# Patient Record
Sex: Male | Born: 1935
Health system: Southern US, Community
[De-identification: ages and names within clinical notes are randomized; demographics above are authoritative.]

## PROBLEM LIST (undated history)

## (undated) DIAGNOSIS — M1990 Unspecified osteoarthritis, unspecified site: Secondary | ICD-10-CM

## (undated) DIAGNOSIS — R001 Bradycardia, unspecified: Secondary | ICD-10-CM

## (undated) DIAGNOSIS — R609 Edema, unspecified: Secondary | ICD-10-CM

## (undated) DIAGNOSIS — I10 Essential (primary) hypertension: Secondary | ICD-10-CM

## (undated) DIAGNOSIS — S42309A Unspecified fracture of shaft of humerus, unspecified arm, initial encounter for closed fracture: Secondary | ICD-10-CM

## (undated) DIAGNOSIS — R600 Localized edema: Secondary | ICD-10-CM

## (undated) DIAGNOSIS — K219 Gastro-esophageal reflux disease without esophagitis: Secondary | ICD-10-CM

## (undated) DIAGNOSIS — I219 Acute myocardial infarction, unspecified: Secondary | ICD-10-CM

## (undated) DIAGNOSIS — E119 Type 2 diabetes mellitus without complications: Secondary | ICD-10-CM

## (undated) DIAGNOSIS — I2510 Atherosclerotic heart disease of native coronary artery without angina pectoris: Secondary | ICD-10-CM

## (undated) DIAGNOSIS — E785 Hyperlipidemia, unspecified: Secondary | ICD-10-CM

## (undated) DIAGNOSIS — N186 End stage renal disease: Secondary | ICD-10-CM

## (undated) DIAGNOSIS — R0609 Other forms of dyspnea: Secondary | ICD-10-CM

## (undated) DIAGNOSIS — R011 Cardiac murmur, unspecified: Secondary | ICD-10-CM

## (undated) DIAGNOSIS — I509 Heart failure, unspecified: Secondary | ICD-10-CM

## (undated) DIAGNOSIS — R0600 Dyspnea, unspecified: Secondary | ICD-10-CM

## (undated) DIAGNOSIS — J189 Pneumonia, unspecified organism: Secondary | ICD-10-CM

## (undated) DIAGNOSIS — Z7902 Long term (current) use of antithrombotics/antiplatelets: Secondary | ICD-10-CM

## (undated) DIAGNOSIS — I429 Cardiomyopathy, unspecified: Secondary | ICD-10-CM

## (undated) DIAGNOSIS — D649 Anemia, unspecified: Secondary | ICD-10-CM

## (undated) DIAGNOSIS — N189 Chronic kidney disease, unspecified: Secondary | ICD-10-CM

## (undated) DIAGNOSIS — I4891 Unspecified atrial fibrillation: Secondary | ICD-10-CM

## (undated) DIAGNOSIS — I209 Angina pectoris, unspecified: Secondary | ICD-10-CM

## (undated) DIAGNOSIS — D509 Iron deficiency anemia, unspecified: Secondary | ICD-10-CM

## (undated) DIAGNOSIS — I5020 Unspecified systolic (congestive) heart failure: Secondary | ICD-10-CM

## (undated) HISTORY — PX: CHOLECYSTECTOMY: SHX55

## (undated) HISTORY — PX: CORONARY ANGIOPLASTY: SHX604

## (undated) HISTORY — PX: VEIN BYPASS SURGERY: SHX833

## (undated) HISTORY — PX: FRACTURE SURGERY: SHX138

## (undated) HISTORY — PX: CORONARY ARTERY BYPASS GRAFT: SHX141

---

## 1998-05-24 DIAGNOSIS — I219 Acute myocardial infarction, unspecified: Secondary | ICD-10-CM

## 1998-05-24 HISTORY — DX: Acute myocardial infarction, unspecified: I21.9

## 1999-05-25 DIAGNOSIS — Z951 Presence of aortocoronary bypass graft: Secondary | ICD-10-CM

## 1999-05-25 HISTORY — PX: CORONARY ARTERY BYPASS GRAFT: SHX141

## 1999-05-25 HISTORY — DX: Presence of aortocoronary bypass graft: Z95.1

## 2000-09-28 ENCOUNTER — Inpatient Hospital Stay (HOSPITAL_COMMUNITY)
Admission: AD | Admit: 2000-09-28 | Discharge: 2000-10-06 | Payer: Self-pay | Admitting: Thoracic Surgery (Cardiothoracic Vascular Surgery)

## 2000-09-28 ENCOUNTER — Encounter: Payer: Self-pay | Admitting: Thoracic Surgery (Cardiothoracic Vascular Surgery)

## 2000-09-29 ENCOUNTER — Encounter: Payer: Self-pay | Admitting: Thoracic Surgery (Cardiothoracic Vascular Surgery)

## 2000-09-30 ENCOUNTER — Encounter: Payer: Self-pay | Admitting: Thoracic Surgery (Cardiothoracic Vascular Surgery)

## 2000-10-01 ENCOUNTER — Encounter: Payer: Self-pay | Admitting: Thoracic Surgery (Cardiothoracic Vascular Surgery)

## 2000-10-04 ENCOUNTER — Encounter: Payer: Self-pay | Admitting: Thoracic Surgery (Cardiothoracic Vascular Surgery)

## 2004-04-02 ENCOUNTER — Inpatient Hospital Stay: Payer: Self-pay | Admitting: Cardiovascular Disease

## 2004-04-02 ENCOUNTER — Other Ambulatory Visit: Payer: Self-pay

## 2004-04-03 ENCOUNTER — Other Ambulatory Visit: Payer: Self-pay

## 2004-04-08 ENCOUNTER — Ambulatory Visit: Payer: Self-pay | Admitting: Surgery

## 2005-10-11 ENCOUNTER — Ambulatory Visit: Payer: Self-pay | Admitting: Gastroenterology

## 2006-10-06 DIAGNOSIS — I10 Essential (primary) hypertension: Secondary | ICD-10-CM | POA: Insufficient documentation

## 2006-10-06 DIAGNOSIS — E663 Overweight: Secondary | ICD-10-CM | POA: Insufficient documentation

## 2007-01-06 DIAGNOSIS — K2100 Gastro-esophageal reflux disease with esophagitis, without bleeding: Secondary | ICD-10-CM | POA: Insufficient documentation

## 2007-01-06 DIAGNOSIS — J301 Allergic rhinitis due to pollen: Secondary | ICD-10-CM | POA: Insufficient documentation

## 2009-02-06 ENCOUNTER — Ambulatory Visit: Payer: Self-pay | Admitting: Cardiovascular Disease

## 2009-02-06 HISTORY — PX: OTHER SURGICAL HISTORY: SHX169

## 2012-05-02 DIAGNOSIS — K922 Gastrointestinal hemorrhage, unspecified: Secondary | ICD-10-CM | POA: Insufficient documentation

## 2013-05-04 DIAGNOSIS — N289 Disorder of kidney and ureter, unspecified: Secondary | ICD-10-CM | POA: Insufficient documentation

## 2013-05-24 DIAGNOSIS — S42309A Unspecified fracture of shaft of humerus, unspecified arm, initial encounter for closed fracture: Secondary | ICD-10-CM

## 2013-05-24 HISTORY — PX: FRACTURE SURGERY: SHX138

## 2013-05-24 HISTORY — DX: Unspecified fracture of shaft of humerus, unspecified arm, initial encounter for closed fracture: S42.309A

## 2013-06-01 DIAGNOSIS — I2510 Atherosclerotic heart disease of native coronary artery without angina pectoris: Secondary | ICD-10-CM | POA: Diagnosis not present

## 2013-06-01 DIAGNOSIS — E119 Type 2 diabetes mellitus without complications: Secondary | ICD-10-CM | POA: Diagnosis not present

## 2013-06-01 DIAGNOSIS — I10 Essential (primary) hypertension: Secondary | ICD-10-CM | POA: Diagnosis not present

## 2013-06-01 DIAGNOSIS — S83419A Sprain of medial collateral ligament of unspecified knee, initial encounter: Secondary | ICD-10-CM | POA: Diagnosis not present

## 2013-06-01 DIAGNOSIS — N400 Enlarged prostate without lower urinary tract symptoms: Secondary | ICD-10-CM | POA: Diagnosis not present

## 2013-06-01 DIAGNOSIS — E785 Hyperlipidemia, unspecified: Secondary | ICD-10-CM | POA: Diagnosis not present

## 2013-06-04 DIAGNOSIS — N183 Chronic kidney disease, stage 3 unspecified: Secondary | ICD-10-CM | POA: Diagnosis not present

## 2013-06-04 DIAGNOSIS — E7800 Pure hypercholesterolemia, unspecified: Secondary | ICD-10-CM | POA: Diagnosis not present

## 2013-06-04 DIAGNOSIS — I10 Essential (primary) hypertension: Secondary | ICD-10-CM | POA: Diagnosis not present

## 2013-06-04 DIAGNOSIS — R809 Proteinuria, unspecified: Secondary | ICD-10-CM | POA: Diagnosis not present

## 2013-06-06 ENCOUNTER — Emergency Department: Payer: Self-pay | Admitting: Emergency Medicine

## 2013-06-06 DIAGNOSIS — Z79899 Other long term (current) drug therapy: Secondary | ICD-10-CM | POA: Diagnosis not present

## 2013-06-06 DIAGNOSIS — E785 Hyperlipidemia, unspecified: Secondary | ICD-10-CM | POA: Diagnosis not present

## 2013-06-06 DIAGNOSIS — M79609 Pain in unspecified limb: Secondary | ICD-10-CM | POA: Diagnosis not present

## 2013-06-06 DIAGNOSIS — S46909A Unspecified injury of unspecified muscle, fascia and tendon at shoulder and upper arm level, unspecified arm, initial encounter: Secondary | ICD-10-CM | POA: Diagnosis not present

## 2013-06-06 DIAGNOSIS — S42213A Unspecified displaced fracture of surgical neck of unspecified humerus, initial encounter for closed fracture: Secondary | ICD-10-CM | POA: Diagnosis not present

## 2013-06-06 DIAGNOSIS — S42209A Unspecified fracture of upper end of unspecified humerus, initial encounter for closed fracture: Secondary | ICD-10-CM | POA: Diagnosis not present

## 2013-06-06 DIAGNOSIS — S6990XA Unspecified injury of unspecified wrist, hand and finger(s), initial encounter: Secondary | ICD-10-CM | POA: Diagnosis not present

## 2013-06-06 DIAGNOSIS — S4980XA Other specified injuries of shoulder and upper arm, unspecified arm, initial encounter: Secondary | ICD-10-CM | POA: Diagnosis not present

## 2013-06-06 DIAGNOSIS — S59909A Unspecified injury of unspecified elbow, initial encounter: Secondary | ICD-10-CM | POA: Diagnosis not present

## 2013-06-06 DIAGNOSIS — K219 Gastro-esophageal reflux disease without esophagitis: Secondary | ICD-10-CM | POA: Diagnosis not present

## 2013-06-07 DIAGNOSIS — S42213A Unspecified displaced fracture of surgical neck of unspecified humerus, initial encounter for closed fracture: Secondary | ICD-10-CM | POA: Diagnosis not present

## 2013-06-14 DIAGNOSIS — E559 Vitamin D deficiency, unspecified: Secondary | ICD-10-CM | POA: Diagnosis not present

## 2013-06-14 DIAGNOSIS — IMO0002 Reserved for concepts with insufficient information to code with codable children: Secondary | ICD-10-CM | POA: Diagnosis not present

## 2013-06-14 DIAGNOSIS — N183 Chronic kidney disease, stage 3 unspecified: Secondary | ICD-10-CM | POA: Diagnosis not present

## 2013-06-14 DIAGNOSIS — E785 Hyperlipidemia, unspecified: Secondary | ICD-10-CM | POA: Diagnosis not present

## 2013-06-19 DIAGNOSIS — S42309D Unspecified fracture of shaft of humerus, unspecified arm, subsequent encounter for fracture with routine healing: Secondary | ICD-10-CM | POA: Diagnosis not present

## 2013-07-03 DIAGNOSIS — S42309D Unspecified fracture of shaft of humerus, unspecified arm, subsequent encounter for fracture with routine healing: Secondary | ICD-10-CM | POA: Diagnosis not present

## 2013-07-06 DIAGNOSIS — M6281 Muscle weakness (generalized): Secondary | ICD-10-CM | POA: Diagnosis not present

## 2013-07-06 DIAGNOSIS — M25519 Pain in unspecified shoulder: Secondary | ICD-10-CM | POA: Diagnosis not present

## 2013-07-06 DIAGNOSIS — M25619 Stiffness of unspecified shoulder, not elsewhere classified: Secondary | ICD-10-CM | POA: Diagnosis not present

## 2013-07-09 DIAGNOSIS — M25619 Stiffness of unspecified shoulder, not elsewhere classified: Secondary | ICD-10-CM | POA: Diagnosis not present

## 2013-07-09 DIAGNOSIS — M6281 Muscle weakness (generalized): Secondary | ICD-10-CM | POA: Diagnosis not present

## 2013-07-12 DIAGNOSIS — M6281 Muscle weakness (generalized): Secondary | ICD-10-CM | POA: Diagnosis not present

## 2013-07-12 DIAGNOSIS — M25519 Pain in unspecified shoulder: Secondary | ICD-10-CM | POA: Diagnosis not present

## 2013-07-12 DIAGNOSIS — M25619 Stiffness of unspecified shoulder, not elsewhere classified: Secondary | ICD-10-CM | POA: Diagnosis not present

## 2013-07-16 DIAGNOSIS — M25619 Stiffness of unspecified shoulder, not elsewhere classified: Secondary | ICD-10-CM | POA: Diagnosis not present

## 2013-07-16 DIAGNOSIS — M25519 Pain in unspecified shoulder: Secondary | ICD-10-CM | POA: Diagnosis not present

## 2013-07-16 DIAGNOSIS — M6281 Muscle weakness (generalized): Secondary | ICD-10-CM | POA: Diagnosis not present

## 2013-07-18 DIAGNOSIS — M25519 Pain in unspecified shoulder: Secondary | ICD-10-CM | POA: Diagnosis not present

## 2013-07-18 DIAGNOSIS — M25619 Stiffness of unspecified shoulder, not elsewhere classified: Secondary | ICD-10-CM | POA: Diagnosis not present

## 2013-07-18 DIAGNOSIS — M6281 Muscle weakness (generalized): Secondary | ICD-10-CM | POA: Diagnosis not present

## 2013-07-23 DIAGNOSIS — M25619 Stiffness of unspecified shoulder, not elsewhere classified: Secondary | ICD-10-CM | POA: Diagnosis not present

## 2013-07-23 DIAGNOSIS — M6281 Muscle weakness (generalized): Secondary | ICD-10-CM | POA: Diagnosis not present

## 2013-07-26 DIAGNOSIS — S42309D Unspecified fracture of shaft of humerus, unspecified arm, subsequent encounter for fracture with routine healing: Secondary | ICD-10-CM | POA: Diagnosis not present

## 2013-07-26 DIAGNOSIS — M25519 Pain in unspecified shoulder: Secondary | ICD-10-CM | POA: Diagnosis not present

## 2013-07-26 DIAGNOSIS — M25619 Stiffness of unspecified shoulder, not elsewhere classified: Secondary | ICD-10-CM | POA: Diagnosis not present

## 2013-07-26 DIAGNOSIS — M6281 Muscle weakness (generalized): Secondary | ICD-10-CM | POA: Diagnosis not present

## 2013-08-02 ENCOUNTER — Ambulatory Visit: Payer: Self-pay | Admitting: General Practice

## 2013-08-02 DIAGNOSIS — M19019 Primary osteoarthritis, unspecified shoulder: Secondary | ICD-10-CM | POA: Diagnosis not present

## 2013-08-02 DIAGNOSIS — M25419 Effusion, unspecified shoulder: Secondary | ICD-10-CM | POA: Diagnosis not present

## 2013-08-02 DIAGNOSIS — R0602 Shortness of breath: Secondary | ICD-10-CM | POA: Diagnosis not present

## 2013-08-02 DIAGNOSIS — M25519 Pain in unspecified shoulder: Secondary | ICD-10-CM | POA: Diagnosis not present

## 2013-08-02 DIAGNOSIS — I2510 Atherosclerotic heart disease of native coronary artery without angina pectoris: Secondary | ICD-10-CM | POA: Diagnosis not present

## 2013-08-02 DIAGNOSIS — I10 Essential (primary) hypertension: Secondary | ICD-10-CM | POA: Diagnosis not present

## 2013-08-02 DIAGNOSIS — I25810 Atherosclerosis of coronary artery bypass graft(s) without angina pectoris: Secondary | ICD-10-CM | POA: Diagnosis not present

## 2013-08-02 DIAGNOSIS — E785 Hyperlipidemia, unspecified: Secondary | ICD-10-CM | POA: Diagnosis not present

## 2013-08-09 DIAGNOSIS — I2510 Atherosclerotic heart disease of native coronary artery without angina pectoris: Secondary | ICD-10-CM | POA: Diagnosis not present

## 2013-08-09 DIAGNOSIS — I10 Essential (primary) hypertension: Secondary | ICD-10-CM | POA: Diagnosis not present

## 2013-08-09 DIAGNOSIS — R0602 Shortness of breath: Secondary | ICD-10-CM | POA: Diagnosis not present

## 2013-08-13 DIAGNOSIS — M6281 Muscle weakness (generalized): Secondary | ICD-10-CM | POA: Diagnosis not present

## 2013-08-13 DIAGNOSIS — M25519 Pain in unspecified shoulder: Secondary | ICD-10-CM | POA: Diagnosis not present

## 2013-08-13 DIAGNOSIS — M25619 Stiffness of unspecified shoulder, not elsewhere classified: Secondary | ICD-10-CM | POA: Diagnosis not present

## 2013-08-20 DIAGNOSIS — M25619 Stiffness of unspecified shoulder, not elsewhere classified: Secondary | ICD-10-CM | POA: Diagnosis not present

## 2013-08-20 DIAGNOSIS — M6281 Muscle weakness (generalized): Secondary | ICD-10-CM | POA: Diagnosis not present

## 2013-08-23 DIAGNOSIS — M25619 Stiffness of unspecified shoulder, not elsewhere classified: Secondary | ICD-10-CM | POA: Diagnosis not present

## 2013-08-23 DIAGNOSIS — M6281 Muscle weakness (generalized): Secondary | ICD-10-CM | POA: Diagnosis not present

## 2013-08-23 DIAGNOSIS — M25519 Pain in unspecified shoulder: Secondary | ICD-10-CM | POA: Diagnosis not present

## 2013-08-27 DIAGNOSIS — M25619 Stiffness of unspecified shoulder, not elsewhere classified: Secondary | ICD-10-CM | POA: Diagnosis not present

## 2013-08-27 DIAGNOSIS — M25519 Pain in unspecified shoulder: Secondary | ICD-10-CM | POA: Diagnosis not present

## 2013-08-27 DIAGNOSIS — E119 Type 2 diabetes mellitus without complications: Secondary | ICD-10-CM | POA: Diagnosis not present

## 2013-08-27 DIAGNOSIS — I10 Essential (primary) hypertension: Secondary | ICD-10-CM | POA: Diagnosis not present

## 2013-08-27 DIAGNOSIS — M6281 Muscle weakness (generalized): Secondary | ICD-10-CM | POA: Diagnosis not present

## 2013-08-31 DIAGNOSIS — I10 Essential (primary) hypertension: Secondary | ICD-10-CM | POA: Diagnosis not present

## 2013-08-31 DIAGNOSIS — E119 Type 2 diabetes mellitus without complications: Secondary | ICD-10-CM | POA: Diagnosis not present

## 2013-08-31 DIAGNOSIS — M25619 Stiffness of unspecified shoulder, not elsewhere classified: Secondary | ICD-10-CM | POA: Diagnosis not present

## 2013-08-31 DIAGNOSIS — S83419A Sprain of medial collateral ligament of unspecified knee, initial encounter: Secondary | ICD-10-CM | POA: Diagnosis not present

## 2013-08-31 DIAGNOSIS — M6281 Muscle weakness (generalized): Secondary | ICD-10-CM | POA: Diagnosis not present

## 2013-08-31 DIAGNOSIS — E785 Hyperlipidemia, unspecified: Secondary | ICD-10-CM | POA: Diagnosis not present

## 2013-08-31 DIAGNOSIS — M25519 Pain in unspecified shoulder: Secondary | ICD-10-CM | POA: Diagnosis not present

## 2013-08-31 DIAGNOSIS — N400 Enlarged prostate without lower urinary tract symptoms: Secondary | ICD-10-CM | POA: Diagnosis not present

## 2013-08-31 DIAGNOSIS — I2510 Atherosclerotic heart disease of native coronary artery without angina pectoris: Secondary | ICD-10-CM | POA: Diagnosis not present

## 2013-09-04 DIAGNOSIS — M6281 Muscle weakness (generalized): Secondary | ICD-10-CM | POA: Diagnosis not present

## 2013-09-04 DIAGNOSIS — M25619 Stiffness of unspecified shoulder, not elsewhere classified: Secondary | ICD-10-CM | POA: Diagnosis not present

## 2013-09-04 DIAGNOSIS — M25519 Pain in unspecified shoulder: Secondary | ICD-10-CM | POA: Diagnosis not present

## 2013-09-06 DIAGNOSIS — M6281 Muscle weakness (generalized): Secondary | ICD-10-CM | POA: Diagnosis not present

## 2013-09-06 DIAGNOSIS — M25619 Stiffness of unspecified shoulder, not elsewhere classified: Secondary | ICD-10-CM | POA: Diagnosis not present

## 2013-09-24 DIAGNOSIS — I10 Essential (primary) hypertension: Secondary | ICD-10-CM | POA: Diagnosis not present

## 2013-09-24 DIAGNOSIS — E559 Vitamin D deficiency, unspecified: Secondary | ICD-10-CM | POA: Diagnosis not present

## 2013-09-24 DIAGNOSIS — R809 Proteinuria, unspecified: Secondary | ICD-10-CM | POA: Diagnosis not present

## 2013-09-24 DIAGNOSIS — N183 Chronic kidney disease, stage 3 unspecified: Secondary | ICD-10-CM | POA: Diagnosis not present

## 2013-12-02 ENCOUNTER — Encounter (HOSPITAL_COMMUNITY): Payer: Self-pay | Admitting: Emergency Medicine

## 2013-12-02 ENCOUNTER — Inpatient Hospital Stay (HOSPITAL_COMMUNITY)
Admission: EM | Admit: 2013-12-02 | Discharge: 2013-12-07 | DRG: 247 | Disposition: A | Discharge status: Home / Self Care | Payer: Medicare Other | Attending: Interventional Cardiology | Admitting: Interventional Cardiology

## 2013-12-02 ENCOUNTER — Emergency Department (HOSPITAL_COMMUNITY): Payer: Medicare Other

## 2013-12-02 DIAGNOSIS — I129 Hypertensive chronic kidney disease with stage 1 through stage 4 chronic kidney disease, or unspecified chronic kidney disease: Secondary | ICD-10-CM | POA: Diagnosis present

## 2013-12-02 DIAGNOSIS — R079 Chest pain, unspecified: Secondary | ICD-10-CM

## 2013-12-02 DIAGNOSIS — Z7982 Long term (current) use of aspirin: Secondary | ICD-10-CM

## 2013-12-02 DIAGNOSIS — Z8249 Family history of ischemic heart disease and other diseases of the circulatory system: Secondary | ICD-10-CM

## 2013-12-02 DIAGNOSIS — Z9089 Acquired absence of other organs: Secondary | ICD-10-CM

## 2013-12-02 DIAGNOSIS — I25700 Atherosclerosis of coronary artery bypass graft(s), unspecified, with unstable angina pectoris: Secondary | ICD-10-CM

## 2013-12-02 DIAGNOSIS — R0681 Apnea, not elsewhere classified: Secondary | ICD-10-CM | POA: Diagnosis not present

## 2013-12-02 DIAGNOSIS — Z87891 Personal history of nicotine dependence: Secondary | ICD-10-CM

## 2013-12-02 DIAGNOSIS — I428 Other cardiomyopathies: Secondary | ICD-10-CM | POA: Diagnosis present

## 2013-12-02 DIAGNOSIS — N184 Chronic kidney disease, stage 4 (severe): Secondary | ICD-10-CM

## 2013-12-02 DIAGNOSIS — N183 Chronic kidney disease, stage 3 unspecified: Secondary | ICD-10-CM | POA: Diagnosis present

## 2013-12-02 DIAGNOSIS — R0789 Other chest pain: Secondary | ICD-10-CM | POA: Diagnosis not present

## 2013-12-02 DIAGNOSIS — N289 Disorder of kidney and ureter, unspecified: Secondary | ICD-10-CM

## 2013-12-02 DIAGNOSIS — Z992 Dependence on renal dialysis: Secondary | ICD-10-CM | POA: Diagnosis present

## 2013-12-02 DIAGNOSIS — IMO0002 Reserved for concepts with insufficient information to code with codable children: Secondary | ICD-10-CM | POA: Diagnosis not present

## 2013-12-02 DIAGNOSIS — I10 Essential (primary) hypertension: Secondary | ICD-10-CM

## 2013-12-02 DIAGNOSIS — I200 Unstable angina: Secondary | ICD-10-CM | POA: Diagnosis present

## 2013-12-02 DIAGNOSIS — I5022 Chronic systolic (congestive) heart failure: Secondary | ICD-10-CM

## 2013-12-02 DIAGNOSIS — I4949 Other premature depolarization: Secondary | ICD-10-CM | POA: Diagnosis not present

## 2013-12-02 DIAGNOSIS — D696 Thrombocytopenia, unspecified: Secondary | ICD-10-CM | POA: Diagnosis not present

## 2013-12-02 DIAGNOSIS — I252 Old myocardial infarction: Secondary | ICD-10-CM

## 2013-12-02 DIAGNOSIS — Y840 Cardiac catheterization as the cause of abnormal reaction of the patient, or of later complication, without mention of misadventure at the time of the procedure: Secondary | ICD-10-CM | POA: Diagnosis not present

## 2013-12-02 DIAGNOSIS — I2510 Atherosclerotic heart disease of native coronary artery without angina pectoris: Secondary | ICD-10-CM | POA: Diagnosis not present

## 2013-12-02 DIAGNOSIS — E119 Type 2 diabetes mellitus without complications: Secondary | ICD-10-CM | POA: Diagnosis present

## 2013-12-02 DIAGNOSIS — I2582 Chronic total occlusion of coronary artery: Secondary | ICD-10-CM | POA: Diagnosis present

## 2013-12-02 DIAGNOSIS — Y9210 Unspecified residential institution as the place of occurrence of the external cause: Secondary | ICD-10-CM | POA: Diagnosis not present

## 2013-12-02 DIAGNOSIS — Z7902 Long term (current) use of antithrombotics/antiplatelets: Secondary | ICD-10-CM

## 2013-12-02 DIAGNOSIS — E785 Hyperlipidemia, unspecified: Secondary | ICD-10-CM | POA: Diagnosis present

## 2013-12-02 DIAGNOSIS — Z79899 Other long term (current) drug therapy: Secondary | ICD-10-CM

## 2013-12-02 DIAGNOSIS — I25810 Atherosclerosis of coronary artery bypass graft(s) without angina pectoris: Secondary | ICD-10-CM | POA: Diagnosis present

## 2013-12-02 DIAGNOSIS — I493 Ventricular premature depolarization: Secondary | ICD-10-CM

## 2013-12-02 DIAGNOSIS — I519 Heart disease, unspecified: Secondary | ICD-10-CM | POA: Diagnosis present

## 2013-12-02 DIAGNOSIS — I25709 Atherosclerosis of coronary artery bypass graft(s), unspecified, with unspecified angina pectoris: Secondary | ICD-10-CM

## 2013-12-02 DIAGNOSIS — Z955 Presence of coronary angioplasty implant and graft: Secondary | ICD-10-CM

## 2013-12-02 DIAGNOSIS — I120 Hypertensive chronic kidney disease with stage 5 chronic kidney disease or end stage renal disease: Secondary | ICD-10-CM | POA: Diagnosis present

## 2013-12-02 HISTORY — DX: Unspecified fracture of shaft of humerus, unspecified arm, initial encounter for closed fracture: S42.309A

## 2013-12-02 HISTORY — DX: Essential (primary) hypertension: I10

## 2013-12-02 HISTORY — DX: Acute myocardial infarction, unspecified: I21.9

## 2013-12-02 HISTORY — DX: Atherosclerotic heart disease of native coronary artery without angina pectoris: I25.10

## 2013-12-02 HISTORY — DX: Type 2 diabetes mellitus without complications: E11.9

## 2013-12-02 HISTORY — DX: Unspecified osteoarthritis, unspecified site: M19.90

## 2013-12-02 MED ORDER — MORPHINE SULFATE 4 MG/ML IJ SOLN
4.0000 mg | Freq: Once | INTRAMUSCULAR | Status: AC
  Administered 2013-12-03: 4 mg via INTRAVENOUS
  Filled 2013-12-02: qty 1

## 2013-12-02 NOTE — ED Provider Notes (Signed)
CSN: [id number]     Arrival date & time 12/02/13  2239 History   First MD Initiated Contact with Patient 12/02/13 2320     Chief Complaint  Patient presents with  . Chest Pain     (Consider location/radiation/quality/duration/timing/severity/associated sxs/prior Treatment) Patient is a 80 y.o. male presenting with chest pain. The history is provided by the patient.  Chest Pain He started having indigestion at about 6 PM. The indigestion did radiate into his chest, and he had a tired, achy feeling which went into his teeth and arms. He tried taking this with no relief. Because he had some discomfort in his chest, he did take nitroglycerin which also did not give him any relief. He denies dyspnea, nausea, diaphoresis. Pain was 4/10 at its worst, and is now down to 1/10. The only pain that is remaining is in his left arm. He states his indigestion is completely resolved. He did develop a headache prior to his taking nitroglycerin. He will called an ambulance and EMS did give him aspirin and headache has improved somewhat. He is status post coronary artery bypass 15 years ago and this is the first time he has had any chest discomfort since then. Nothing made his symptoms better and nothing made them worse.  Past Medical History  Diagnosis Date  . Arthritis   . Diabetes mellitus without complication   . Broken arm 05/2013    Left   Past Surgical History  Procedure Laterality Date  . Cholecystectomy    . Vein bypass surgery      [place] Bypass Surgery    Family History  Problem Relation Age of Onset  . Heart failure Father    History  Substance Use Topics  . Smoking status: Former Smoker    Types: Cigarettes  . Smokeless tobacco: Not on file  . Alcohol Use: No    Review of Systems  Cardiovascular: Positive for chest pain.  All other systems reviewed and are negative.     Allergies  Review of patient's allergies indicates no known allergies.  Home Medications   Prior to  Admission medications   Medication Sig Start Date End Date Taking? Authorizing Provider  aspirin EC 81 MG tablet Take 81 mg by mouth at bedtime.   Yes Historical Provider, MD  carvedilol (COREG) 25 MG tablet Take 25 mg by mouth 2 (two) times daily with a meal.   Yes Historical Provider, MD  Cholecalciferol (VITAMIN D3) 2000 UNITS TABS Take 2,000 Units by mouth daily with breakfast.   Yes Historical Provider, MD  famotidine (PEPCID) 20 MG tablet Take 20 mg by mouth 2 (two) times daily.   Yes Historical Provider, MD  fenofibrate 160 MG tablet Take 160 mg by mouth daily.   Yes Historical Provider, MD  glipiZIDE (GLUCOTROL XL) 5 MG 24 hr tablet Take 5 mg by mouth daily.   Yes Historical Provider, MD  losartan (COZAAR) 100 MG tablet Take 100 mg by mouth daily.   Yes Historical Provider, MD  Multiple Vitamins-Minerals (CENTRUM SILVER ADULT 50+) TABS Take 1 tablet by mouth daily with breakfast.   Yes Historical Provider, MD  nitroGLYCERIN (NITROSTAT) 0.4 MG SL tablet Place 0.4 mg under the tongue every 5 (five) minutes as needed for chest pain.   Yes Historical Provider, MD  Omega-3 Fatty Acids (FISH OIL) 1200 MG CAPS Take 4,800 mg by mouth daily.   Yes Historical Provider, MD  Pitavastatin Calcium (LIVALO) 2 MG TABS Take 2 mg by mouth at bedtime.  Yes Historical Provider, MD  traMADol (ULTRAM) 50 MG tablet Take 50 mg by mouth every 6 (six) hours as needed for moderate pain.   Yes Historical Provider, MD   BP 166/83  Pulse 94  Temp(Src) 98.1 F (36.7 C) (Oral)  Resp 17  Ht 6' (1.829 m)  Wt 240 lb (108.863 kg)  BMI 32.54 kg/m2  SpO2 98% Physical Exam  Nursing note and vitals reviewed.  80 year old male, resting comfortably and in no acute distress. Vital signs are significant for hypertension with blood pressure 166/83. Oxygen saturation is 98%, which is normal. Head is normocephalic and atraumatic. PERRLA, EOMI. Oropharynx is clear. Neck is nontender and supple without adenopathy or JVD. Back  is nontender and there is no CVA tenderness. Lungs are clear without rales, wheezes, or rhonchi. Chest is nontender. Sternotomy scar is present, well-healed. Heart has regular rate and rhythm without murmur. Abdomen is soft, flat, nontender without masses or hepatosplenomegaly and peristalsis is normoactive. Extremities have no cyanosis or edema, full range of motion is present. Skin is warm and dry without rash. Neurologic: Mental status is normal, cranial nerves are intact, there are no motor or sensory deficits.  ED Course  Procedures (including critical care time) Labs Review Results for orders placed during the hospital encounter of [id number]  BASIC METABOLIC PANEL      Result Value Ref Range   Sodium 139  137 - 147 mEq/L   Potassium 4.8  3.7 - 5.3 mEq/L   Chloride 100  96 - 112 mEq/L   CO2 23  19 - 32 mEq/L   Glucose, Bld 160 (*) 70 - 99 mg/dL   BUN 24 (*) 6 - 23 mg/dL   Creatinine, Ser 1.91 (*) 0.50 - 1.35 mg/dL   Calcium 9.7  8.4 - 10.5 mg/dL   GFR calc non Af Amer 32 (*) >90 mL/min   GFR calc Af Amer 37 (*) >90 mL/min   Anion gap 16 (*) 5 - 15  CBC WITH DIFFERENTIAL      Result Value Ref Range   WBC 7.5  4.0 - 10.5 K/uL   RBC 4.29  4.22 - 5.81 MIL/uL   Hemoglobin 13.2  13.0 - 17.0 g/dL   HCT 39.8  39.0 - 52.0 %   MCV 92.8  78.0 - 100.0 fL   MCH 30.8  26.0 - 34.0 pg   MCHC 33.2  30.0 - 36.0 g/dL   RDW 13.5  11.5 - 15.5 %   Platelets 125 (*) 150 - 400 K/uL   Neutrophils Relative % 77  43 - 77 %   Neutro Abs 5.8  1.7 - 7.7 K/uL   Lymphocytes Relative 12  12 - 46 %   Lymphs Abs 0.9  0.7 - 4.0 K/uL   Monocytes Relative 7  3 - 12 %   Monocytes Absolute 0.5  0.1 - 1.0 K/uL   Eosinophils Relative 4  0 - 5 %   Eosinophils Absolute 0.3  0.0 - 0.7 K/uL   Basophils Relative 0  0 - 1 %   Basophils Absolute 0.0  0.0 - 0.1 K/uL  I-STAT TROPOININ, ED      Result Value Ref Range   Troponin i, poc 0.03  0.00 - 0.08 ng/mL   Comment 3            Imaging Review: Dg Chest Port  1 View  12/02/2013   CLINICAL DATA:  Chest pain radiating to arms.  EXAM: PORTABLE CHEST -  1 VIEW  COMPARISON:  None.  FINDINGS: The cardiac silhouette appears moderately enlarged, mediastinal silhouette is nonsuspicious, status post median sternotomy. No pleural effusions or focal consolidations. Probable granuloma left mid lung zone. No pneumothorax.  Multiple EKG lines overlie the patient and may obscure subtle underlying pathology. Partially imaged age indeterminate left humeral head fracture with degenerative change. Soft tissue planes are nonsuspicious.  IMPRESSION: Moderate cardiomegaly, no acute pulmonary process.  Age indeterminate partially imaged left humeral head fracture. Recommend correlation with point tenderness.   Electronically Signed   By: Elon Alas   On: 12/02/2013 23:50      EKG Interpretation   Date/Time:  Sunday December 02 2013 22:40:40 EDT Ventricular Rate:  82 PR Interval:  194 QRS Duration: 109 QT Interval:  454 QTC Calculation: 530 R Axis:   -9 Text Interpretation:  Sinus rhythm with frequent Premature ventricular  complexes with bigeminy and paired complexes Non-specific ST-t changes  When compared with ECG of 09/30/2000, T wave inversion in the Anterolateral  leads is no longer Present Premature ventricular complexes are now Present  Confirmed by [facility]  MD, Redell Nazir ([id number]) on 12/02/2013 11:24:10 PM      MDM   Final diagnoses:  Chest pain, unspecified chest pain type  Renal insufficiency  PVCs (premature ventricular contractions)    Indigestion and chest discomfort worrisome for possible recurrent angina. PVCs of uncertain cause. Although he is not on any diuretics, potassium will need to be checked as well as a troponin. Old records are reviewed and his last hospital visit was for coronary artery bypass surgery in 2002  He continues to be free of chest discomfort. He was given morphine for his headache and this has resolved. Initial workup is significant  only for elevated creatinine of 1.9. I have talked to the patient he states that he has been told by his PCP that his kidneys are working at about half normal capacity so it does not sound as if this is a significant change. Case is discussed with Dr. Hal Hope of [facility] who agrees to admit the patient under observation status.  Delora Fuel, MD [id number]

## 2013-12-02 NOTE — ED Notes (Signed)
Per EMS, Patient called after he started to have chest pain in the midcenter radiating to the left and right arm. Patient states, "I thought it was indigestion, but it started to feel like when I had my heart attack before now." Vitals per EMS: 189/99, 48 HR, 100 % on 2L. Patient had taken 3 SL Nitro with no relief before EMS arrived. Patient was given 324 mg of Aspirin during Transport. Patient EKG showed BIgeminy during transport.

## 2013-12-03 ENCOUNTER — Encounter (HOSPITAL_COMMUNITY): Payer: Self-pay | Admitting: Internal Medicine

## 2013-12-03 DIAGNOSIS — Y9210 Unspecified residential institution as the place of occurrence of the external cause: Secondary | ICD-10-CM | POA: Diagnosis not present

## 2013-12-03 DIAGNOSIS — E119 Type 2 diabetes mellitus without complications: Secondary | ICD-10-CM | POA: Diagnosis present

## 2013-12-03 DIAGNOSIS — I428 Other cardiomyopathies: Secondary | ICD-10-CM | POA: Diagnosis present

## 2013-12-03 DIAGNOSIS — I129 Hypertensive chronic kidney disease with stage 1 through stage 4 chronic kidney disease, or unspecified chronic kidney disease: Secondary | ICD-10-CM | POA: Diagnosis present

## 2013-12-03 DIAGNOSIS — I4949 Other premature depolarization: Secondary | ICD-10-CM | POA: Diagnosis not present

## 2013-12-03 DIAGNOSIS — I200 Unstable angina: Secondary | ICD-10-CM | POA: Diagnosis present

## 2013-12-03 DIAGNOSIS — I2582 Chronic total occlusion of coronary artery: Secondary | ICD-10-CM | POA: Diagnosis present

## 2013-12-03 DIAGNOSIS — Z8249 Family history of ischemic heart disease and other diseases of the circulatory system: Secondary | ICD-10-CM | POA: Diagnosis not present

## 2013-12-03 DIAGNOSIS — E785 Hyperlipidemia, unspecified: Secondary | ICD-10-CM | POA: Diagnosis present

## 2013-12-03 DIAGNOSIS — Z79899 Other long term (current) drug therapy: Secondary | ICD-10-CM | POA: Diagnosis not present

## 2013-12-03 DIAGNOSIS — IMO0002 Reserved for concepts with insufficient information to code with codable children: Secondary | ICD-10-CM | POA: Diagnosis not present

## 2013-12-03 DIAGNOSIS — I359 Nonrheumatic aortic valve disorder, unspecified: Secondary | ICD-10-CM | POA: Diagnosis not present

## 2013-12-03 DIAGNOSIS — Y840 Cardiac catheterization as the cause of abnormal reaction of the patient, or of later complication, without mention of misadventure at the time of the procedure: Secondary | ICD-10-CM | POA: Diagnosis not present

## 2013-12-03 DIAGNOSIS — I519 Heart disease, unspecified: Secondary | ICD-10-CM | POA: Diagnosis present

## 2013-12-03 DIAGNOSIS — N183 Chronic kidney disease, stage 3 unspecified: Secondary | ICD-10-CM | POA: Diagnosis present

## 2013-12-03 DIAGNOSIS — R079 Chest pain, unspecified: Secondary | ICD-10-CM

## 2013-12-03 DIAGNOSIS — I38 Endocarditis, valve unspecified: Secondary | ICD-10-CM

## 2013-12-03 DIAGNOSIS — N184 Chronic kidney disease, stage 4 (severe): Secondary | ICD-10-CM

## 2013-12-03 DIAGNOSIS — I120 Hypertensive chronic kidney disease with stage 5 chronic kidney disease or end stage renal disease: Secondary | ICD-10-CM | POA: Diagnosis present

## 2013-12-03 DIAGNOSIS — I252 Old myocardial infarction: Secondary | ICD-10-CM | POA: Diagnosis not present

## 2013-12-03 DIAGNOSIS — Z992 Dependence on renal dialysis: Secondary | ICD-10-CM | POA: Diagnosis present

## 2013-12-03 DIAGNOSIS — Z9089 Acquired absence of other organs: Secondary | ICD-10-CM | POA: Diagnosis not present

## 2013-12-03 DIAGNOSIS — Z9861 Coronary angioplasty status: Secondary | ICD-10-CM | POA: Diagnosis not present

## 2013-12-03 DIAGNOSIS — Z7982 Long term (current) use of aspirin: Secondary | ICD-10-CM | POA: Diagnosis not present

## 2013-12-03 DIAGNOSIS — I2510 Atherosclerotic heart disease of native coronary artery without angina pectoris: Secondary | ICD-10-CM | POA: Diagnosis present

## 2013-12-03 DIAGNOSIS — I10 Essential (primary) hypertension: Secondary | ICD-10-CM

## 2013-12-03 DIAGNOSIS — I25810 Atherosclerosis of coronary artery bypass graft(s) without angina pectoris: Secondary | ICD-10-CM | POA: Diagnosis present

## 2013-12-03 DIAGNOSIS — R0681 Apnea, not elsewhere classified: Secondary | ICD-10-CM | POA: Diagnosis not present

## 2013-12-03 DIAGNOSIS — Z7902 Long term (current) use of antithrombotics/antiplatelets: Secondary | ICD-10-CM | POA: Diagnosis not present

## 2013-12-03 DIAGNOSIS — I5022 Chronic systolic (congestive) heart failure: Secondary | ICD-10-CM

## 2013-12-03 DIAGNOSIS — D696 Thrombocytopenia, unspecified: Secondary | ICD-10-CM | POA: Diagnosis present

## 2013-12-03 DIAGNOSIS — Z87891 Personal history of nicotine dependence: Secondary | ICD-10-CM | POA: Diagnosis not present

## 2013-12-03 HISTORY — DX: Endocarditis, valve unspecified: I38

## 2013-12-03 LAB — COMPREHENSIVE METABOLIC PANEL
ALT: 16 U/L (ref 0–53)
ANION GAP: 13 (ref 5–15)
AST: 20 U/L (ref 0–37)
Albumin: 3.6 g/dL (ref 3.5–5.2)
Alkaline Phosphatase: 37 U/L — ABNORMAL LOW (ref 39–117)
BUN: 22 mg/dL (ref 6–23)
CALCIUM: 9.6 mg/dL (ref 8.4–10.5)
CO2: 24 mEq/L (ref 19–32)
Chloride: 103 mEq/L (ref 96–112)
Creatinine, Ser: 1.91 mg/dL — ABNORMAL HIGH (ref 0.50–1.35)
GFR calc non Af Amer: 32 mL/min — ABNORMAL LOW (ref >90)
GFR, EST AFRICAN AMERICAN: 37 mL/min — AB (ref >90)
GLUCOSE: 110 mg/dL — AB (ref 70–99)
Potassium: 4.4 mEq/L (ref 3.7–5.3)
Sodium: 140 mEq/L (ref 137–147)
Total Bilirubin: 0.7 mg/dL (ref 0.3–1.2)
Total Protein: 6.6 g/dL (ref 6.0–8.3)

## 2013-12-03 LAB — CBC WITH DIFFERENTIAL/PLATELET
BASOS ABS: 0 10*3/uL (ref 0.0–0.1)
Basophils Absolute: 0 10*3/uL (ref 0.0–0.1)
Basophils Relative: 0 % (ref 0–1)
Basophils Relative: 0 % (ref 0–1)
EOS ABS: 0.3 10*3/uL (ref 0.0–0.7)
EOS PCT: 4 % (ref 0–5)
Eosinophils Absolute: 0.3 10*3/uL (ref 0.0–0.7)
Eosinophils Relative: 4 % (ref 0–5)
HCT: 37.9 % — ABNORMAL LOW (ref 39.0–52.0)
HEMATOCRIT: 39.8 % (ref 39.0–52.0)
HEMOGLOBIN: 12.3 g/dL — AB (ref 13.0–17.0)
Hemoglobin: 13.2 g/dL (ref 13.0–17.0)
LYMPHS ABS: 1.6 10*3/uL (ref 0.7–4.0)
LYMPHS ABS: 0.9 10*3/uL (ref 0.7–4.0)
Lymphocytes Relative: 12 % (ref 12–46)
Lymphocytes Relative: 20 % (ref 12–46)
MCH: 29.6 pg (ref 26.0–34.0)
MCH: 30.8 pg (ref 26.0–34.0)
MCHC: 32.5 g/dL (ref 30.0–36.0)
MCHC: 33.2 g/dL (ref 30.0–36.0)
MCV: 91.3 fL (ref 78.0–100.0)
MCV: 92.8 fL (ref 78.0–100.0)
MONOS PCT: 10 % (ref 3–12)
Monocytes Absolute: 0.5 10*3/uL (ref 0.1–1.0)
Monocytes Absolute: 0.8 10*3/uL (ref 0.1–1.0)
Monocytes Relative: 7 % (ref 3–12)
NEUTROS ABS: 5.2 10*3/uL (ref 1.7–7.7)
NEUTROS PCT: 66 % (ref 43–77)
Neutro Abs: 5.8 10*3/uL (ref 1.7–7.7)
Neutrophils Relative %: 77 % (ref 43–77)
Platelets: 124 10*3/uL — ABNORMAL LOW (ref 150–400)
Platelets: 125 10*3/uL — ABNORMAL LOW (ref 150–400)
RBC: 4.15 MIL/uL — AB (ref 4.22–5.81)
RBC: 4.29 MIL/uL (ref 4.22–5.81)
RDW: 13.5 % (ref 11.5–15.5)
RDW: 13.5 % (ref 11.5–15.5)
WBC: 8 10*3/uL (ref 4.0–10.5)
WBC: 7.5 10*3/uL (ref 4.0–10.5)

## 2013-12-03 LAB — BASIC METABOLIC PANEL
ANION GAP: 16 — AB (ref 5–15)
BUN: 24 mg/dL — AB (ref 6–23)
CALCIUM: 9.7 mg/dL (ref 8.4–10.5)
CHLORIDE: 100 meq/L (ref 96–112)
CO2: 23 mEq/L (ref 19–32)
CREATININE: 1.91 mg/dL — AB (ref 0.50–1.35)
GFR, EST AFRICAN AMERICAN: 37 mL/min — AB (ref >90)
GFR, EST NON AFRICAN AMERICAN: 32 mL/min — AB (ref >90)
Glucose, Bld: 160 mg/dL — ABNORMAL HIGH (ref 70–99)
Potassium: 4.8 mEq/L (ref 3.7–5.3)
Sodium: 139 mEq/L (ref 137–147)

## 2013-12-03 LAB — HEPARIN LEVEL (UNFRACTIONATED): Heparin Unfractionated: 0.25 IU/mL — ABNORMAL LOW (ref 0.30–0.70)

## 2013-12-03 LAB — I-STAT TROPONIN, ED: Troponin i, poc: 0.03 ng/mL (ref 0.00–0.08)

## 2013-12-03 LAB — GLUCOSE, CAPILLARY
GLUCOSE-CAPILLARY: 104 mg/dL — AB (ref 70–99)
GLUCOSE-CAPILLARY: 123 mg/dL — AB (ref 70–99)
Glucose-Capillary: 121 mg/dL — ABNORMAL HIGH (ref 70–99)
Glucose-Capillary: 111 mg/dL — ABNORMAL HIGH (ref 70–99)
Glucose-Capillary: 144 mg/dL — ABNORMAL HIGH (ref 70–99)

## 2013-12-03 LAB — TROPONIN I
Troponin I: <0.3 ng/mL (ref <0.30)
Troponin I: <0.3 ng/mL (ref <0.30)
Troponin I: <0.3 ng/mL (ref <0.30)

## 2013-12-03 MED ORDER — ATORVASTATIN CALCIUM 10 MG PO TABS
10.0000 mg | ORAL_TABLET | Freq: Every day | ORAL | Status: DC
  Administered 2013-12-03 – 2013-12-05 (×3): 10 mg via ORAL
  Filled 2013-12-03 (×7): qty 1

## 2013-12-03 MED ORDER — FAMOTIDINE 20 MG PO TABS
20.0000 mg | ORAL_TABLET | Freq: Two times a day (BID) | ORAL | Status: DC
  Filled 2013-12-03 (×2): qty 1

## 2013-12-03 MED ORDER — KCL IN DEXTROSE-NACL 20-5-0.45 MEQ/L-%-% IV SOLN
INTRAVENOUS | Status: DC
  Administered 2013-12-03: 13:00:00 via INTRAVENOUS
  Filled 2013-12-03 (×5): qty 1000

## 2013-12-03 MED ORDER — SODIUM CHLORIDE 0.9 % IJ SOLN: 3.0000 mL | Freq: Two times a day (BID) | INTRAMUSCULAR | Status: DC

## 2013-12-03 MED ORDER — FISH OIL 1200 MG PO CAPS: 4800.0000 mg | ORAL_CAPSULE | Freq: Every day | ORAL | Status: DC

## 2013-12-03 MED ORDER — ONDANSETRON HCL 4 MG/2ML IJ SOLN: 4.0000 mg | Freq: Four times a day (QID) | INTRAMUSCULAR | Status: DC | PRN

## 2013-12-03 MED ORDER — CENTRUM SILVER ADULT 50+ PO TABS: 1.0000 | ORAL_TABLET | Freq: Every day | ORAL | Status: DC

## 2013-12-03 MED ORDER — HEPARIN (PORCINE) IN NACL 100-0.45 UNIT/ML-% IJ SOLN
1300.0000 [IU]/h | INTRAMUSCULAR | Status: DC
  Administered 2013-12-03: 1100 [IU]/h via INTRAVENOUS
  Administered 2013-12-03 – 2013-12-04 (×2): 1300 [IU]/h via INTRAVENOUS
  Filled 2013-12-03 (×3): qty 250

## 2013-12-03 MED ORDER — CARVEDILOL 25 MG PO TABS
25.0000 mg | ORAL_TABLET | Freq: Two times a day (BID) | ORAL | Status: DC
  Administered 2013-12-03 – 2013-12-07 (×7): 25 mg via ORAL
  Filled 2013-12-03: qty 2
  Filled 2013-12-03 (×7): qty 1
  Filled 2013-12-03: qty 2
  Filled 2013-12-03 (×2): qty 1
  Filled 2013-12-03: qty 2
  Filled 2013-12-03 (×2): qty 1

## 2013-12-03 MED ORDER — SODIUM CHLORIDE 0.9 % IV SOLN
INTRAVENOUS | Status: DC
  Administered 2013-12-03 – 2013-12-04 (×2): via INTRAVENOUS

## 2013-12-03 MED ORDER — FAMOTIDINE 20 MG PO TABS
20.0000 mg | ORAL_TABLET | Freq: Every day | ORAL | Status: DC
  Administered 2013-12-04 – 2013-12-07 (×3): 20 mg via ORAL
  Filled 2013-12-03 (×5): qty 1

## 2013-12-03 MED ORDER — NITROGLYCERIN 0.4 MG SL SUBL: 0.4000 mg | SUBLINGUAL_TABLET | SUBLINGUAL | Status: DC | PRN

## 2013-12-03 MED ORDER — ENOXAPARIN SODIUM 40 MG/0.4ML ~~LOC~~ SOLN
40.0000 mg | SUBCUTANEOUS | Status: DC
  Filled 2013-12-03: qty 0.4

## 2013-12-03 MED ORDER — GLIPIZIDE ER 5 MG PO TB24
5.0000 mg | ORAL_TABLET | Freq: Every day | ORAL | Status: DC
  Filled 2013-12-03 (×2): qty 1

## 2013-12-03 MED ORDER — SODIUM CHLORIDE 0.9 % IJ SOLN: 3.0000 mL | INTRAMUSCULAR | Status: DC | PRN

## 2013-12-03 MED ORDER — SODIUM CHLORIDE 0.9 % IV SOLN: 250.0000 mL | INTRAVENOUS | Status: DC | PRN

## 2013-12-03 MED ORDER — FENOFIBRATE 160 MG PO TABS
160.0000 mg | ORAL_TABLET | Freq: Every day | ORAL | Status: DC
  Administered 2013-12-03 – 2013-12-07 (×5): 160 mg via ORAL
  Filled 2013-12-03 (×5): qty 1

## 2013-12-03 MED ORDER — SODIUM CHLORIDE 0.9 % IV SOLN
INTRAVENOUS | Status: AC
  Administered 2013-12-03: 17:00:00 via INTRAVENOUS

## 2013-12-03 MED ORDER — INSULIN ASPART 100 UNIT/ML ~~LOC~~ SOLN
0.0000 [IU] | Freq: Three times a day (TID) | SUBCUTANEOUS | Status: DC
  Administered 2013-12-03: 1 [IU] via SUBCUTANEOUS
  Administered 2013-12-04: 2 [IU] via SUBCUTANEOUS
  Administered 2013-12-05: 3 [IU] via SUBCUTANEOUS

## 2013-12-03 MED ORDER — HEPARIN BOLUS VIA INFUSION
4000.0000 [IU] | Freq: Once | INTRAVENOUS | Status: AC
  Administered 2013-12-03: 4000 [IU] via INTRAVENOUS
  Filled 2013-12-03: qty 4000

## 2013-12-03 MED ORDER — ADULT MULTIVITAMIN W/MINERALS CH
1.0000 | ORAL_TABLET | Freq: Every day | ORAL | Status: DC
  Administered 2013-12-05: 1 via ORAL
  Filled 2013-12-03 (×4): qty 1

## 2013-12-03 MED ORDER — SODIUM CHLORIDE 0.9 % IV SOLN
INTRAVENOUS | Status: DC
  Administered 2013-12-03: 04:00:00 via INTRAVENOUS

## 2013-12-03 MED ORDER — ASPIRIN EC 325 MG PO TBEC
325.0000 mg | DELAYED_RELEASE_TABLET | Freq: Every day | ORAL | Status: DC
  Administered 2013-12-03 – 2013-12-05 (×2): 325 mg via ORAL
  Filled 2013-12-03 (×2): qty 1

## 2013-12-03 MED ORDER — MORPHINE SULFATE 2 MG/ML IJ SOLN: 2.0000 mg | INTRAMUSCULAR | Status: DC | PRN

## 2013-12-03 MED ORDER — LOSARTAN POTASSIUM 50 MG PO TABS
100.0000 mg | ORAL_TABLET | Freq: Every day | ORAL | Status: DC
  Administered 2013-12-03: 100 mg via ORAL
  Filled 2013-12-03: qty 2

## 2013-12-03 MED ORDER — OMEGA-3-ACID ETHYL ESTERS 1 G PO CAPS
2.0000 g | ORAL_CAPSULE | Freq: Two times a day (BID) | ORAL | Status: DC
  Administered 2013-12-03 – 2013-12-07 (×8): 2 g via ORAL
  Filled 2013-12-03 (×11): qty 2

## 2013-12-03 MED ORDER — ACETAMINOPHEN 325 MG PO TABS
650.0000 mg | ORAL_TABLET | ORAL | Status: DC | PRN
  Administered 2013-12-04 (×2): 650 mg via ORAL
  Filled 2013-12-03 (×2): qty 2

## 2013-12-03 MED ORDER — ASPIRIN 81 MG PO CHEW
81.0000 mg | CHEWABLE_TABLET | ORAL | Status: AC
  Administered 2013-12-04: 81 mg via ORAL
  Filled 2013-12-03: qty 1

## 2013-12-03 NOTE — Progress Notes (Signed)
  Echocardiogram 2D Echocardiogram has been performed.  Matthew [name] 12/03/2013, 10:58 AM

## 2013-12-03 NOTE — H&P (Signed)
[facility] History and Physical  Matthew [name] [id number] DOB: 08-Jan-1931 DOA: 12/02/2013  Referring physician: ER physician. PCP: No primary provider on file.   Chief Complaint: Chest pain.  HPI: Matthew [name] is a 80 y.o. male with history of CAD status post CABG, diabetes mellitus, chronic kidney disease, hypertension, hyperlipidemia presents to the ER because of chest pain. Patient started to experience chest pain while having supper last evening 6 PM. Pain was retrosternal pressure-like radiating to the left arm and neck area. Patient had taken some nitroglycerin despite which patient did not improve and called the EMS. The EMS gave patient has been following which pain improved. There was no associated nausea vomiting diaphoresis shortness of breath. In the ER cardiac markers were negative and patient is presently chest pain-free and will be admitted for further management.   Review of Systems: As presented in the history of presenting illness, rest negative.  Past Medical History  Diagnosis Date  . Arthritis   . Diabetes mellitus without complication   . Broken arm 05/2013    Left  . Hypertension   . Coronary artery disease    Past Surgical History  Procedure Laterality Date  . Cholecystectomy    . Vein bypass surgery      [place] Bypass Surgery   . Coronary artery bypass graft     Social History:  reports that he has quit smoking. His smoking use included Cigarettes. He smoked 0.00 packs per day. He does not have any smokeless tobacco history on file. He reports that he does not drink alcohol or use illicit drugs. Where does patient live home. Can patient participate in ADLs? Yes.  No Known Allergies  Family History:  Family History  Problem Relation Age of Onset  . Heart failure Father       Prior to Admission medications   Medication Sig Start Date End Date Taking? Authorizing Provider  aspirin EC 81 MG tablet Take 81 mg by mouth at bedtime.   Yes  Historical Provider, MD  carvedilol (COREG) 25 MG tablet Take 25 mg by mouth 2 (two) times daily with a meal.   Yes Historical Provider, MD  Cholecalciferol (VITAMIN D3) 2000 UNITS TABS Take 2,000 Units by mouth daily with breakfast.   Yes Historical Provider, MD  famotidine (PEPCID) 20 MG tablet Take 20 mg by mouth 2 (two) times daily.   Yes Historical Provider, MD  fenofibrate 160 MG tablet Take 160 mg by mouth daily.   Yes Historical Provider, MD  glipiZIDE (GLUCOTROL XL) 5 MG 24 hr tablet Take 5 mg by mouth daily.   Yes Historical Provider, MD  losartan (COZAAR) 100 MG tablet Take 100 mg by mouth daily.   Yes Historical Provider, MD  Multiple Vitamins-Minerals (CENTRUM SILVER ADULT 50+) TABS Take 1 tablet by mouth daily with breakfast.   Yes Historical Provider, MD  nitroGLYCERIN (NITROSTAT) 0.4 MG SL tablet Place 0.4 mg under the tongue every 5 (five) minutes as needed for chest pain.   Yes Historical Provider, MD  Omega-3 Fatty Acids (FISH OIL) 1200 MG CAPS Take 4,800 mg by mouth daily.   Yes Historical Provider, MD  Pitavastatin Calcium (LIVALO) 2 MG TABS Take 2 mg by mouth at bedtime.   Yes Historical Provider, MD  traMADol (ULTRAM) 50 MG tablet Take 50 mg by mouth every 6 (six) hours as needed for moderate pain.   Yes Historical Provider, MD    Physical Exam: Filed Vitals:   12/03/13 0130 12/03/13 0200 12/03/13 0230  12/03/13 0300  BP: 155/70 165/69 169/59 144/88  Pulse: 55 60 54 61  Temp:      TempSrc:      Resp: 18 18 17 13   Height:      Weight:      SpO2: 97% 96% 97% 97%     General:  Well-developed and nourished.  Eyes: Anicteric no pallor.  ENT: No discharge from the ears eyes nose mouth.  Neck: No mass felt.  Cardiovascular: S1-S2 heard.  Respiratory: No rhonchi or crepitations.  Abdomen: Soft nontender bowel sounds present. No guarding or rigidity.  Skin: No rash.  Musculoskeletal: No edema. Patient is able to abduct his arms specifically the left arm  without any specific pain given that the x-ray was showing left shoulder fracture.  Psychiatric: Appears normal.  Neurologic: Alert awake oriented to time place and person. Moves all extremities.  Labs on Admission:  Basic Metabolic Panel:  Recent Labs Lab 12/02/13 2350  NA 139  K 4.8  CL 100  CO2 23  GLUCOSE 160*  BUN 24*  CREATININE 1.91*  CALCIUM 9.7   Liver Function Tests: No results found for this basename: AST, ALT, ALKPHOS, BILITOT, PROT, ALBUMIN,  in the last 168 hours No results found for this basename: LIPASE, AMYLASE,  in the last 168 hours No results found for this basename: AMMONIA,  in the last 168 hours CBC:  Recent Labs Lab 12/02/13 2350  WBC 7.5  NEUTROABS 5.8  HGB 13.2  HCT 39.8  MCV 92.8  PLT 125*   Cardiac Enzymes: No results found for this basename: CKTOTAL, CKMB, CKMBINDEX, TROPONINI,  in the last 168 hours  BNP (last 3 results) No results found for this basename: PROBNP,  in the last 8760 hours CBG: No results found for this basename: GLUCAP,  in the last 168 hours  Radiological Exams on Admission: Dg Chest Port 1 View  12/02/2013   CLINICAL DATA:  Chest pain radiating to arms.  EXAM: PORTABLE CHEST - 1 VIEW  COMPARISON:  None.  FINDINGS: The cardiac silhouette appears moderately enlarged, mediastinal silhouette is nonsuspicious, status post median sternotomy. No pleural effusions or focal consolidations. Probable granuloma left mid lung zone. No pneumothorax.  Multiple EKG lines overlie the patient and may obscure subtle underlying pathology. Partially imaged age indeterminate left humeral head fracture with degenerative change. Soft tissue planes are nonsuspicious.  IMPRESSION: Moderate cardiomegaly, no acute pulmonary process.  Age indeterminate partially imaged left humeral head fracture. Recommend correlation with point tenderness.   Electronically Signed   By: Elon Alas   On: 12/02/2013 23:50    EKG: Independently reviewed. Rhythm  is difficult to assess but monitor shows sinus rhythm. Nonspecific ST changes with PVCs.  Assessment/Plan Principal Problem:   Chest pain Active Problems:   Diabetes mellitus type 2, uncomplicated   HLD (hyperlipidemia)   HTN (hypertension)   1. Chest pain with history of CAD concerning for unstable angina - patient will be kept n.p.o. in anticipation of possible cardiac procedure in a.m. Cycle cardiac markers. Aspirin. Nitroglycerin when necessary. Heparin infusion. Consult cardiology in a.m. 2. Chronic kidney disease - stage III - closely follow intake output and metabolic panel. Patient states he was told that he has chronic kidney disease. We do not have any old creatinine to compare with. 3. Diabetes mellitus - closely follow CBGs with sliding-scale coverage. 4. Hyperlipidemia - continue present medications. 5. Thrombocytopenia -  We do not have any old labs to compare. Patient does not have  any fever chills or anemia. Follow CBC for any further worsening. 6. Left shoulder fracture probably chronic.    Code Status: Full code.  Family Communication: None.  Disposition Plan: Admit for observation.    Nicollette Wilhelmi N. [facility] Pager [phone].  If 7PM-7AM, please contact night-coverage [email or web address] Password [facility] 12/03/2013, 3:23 AM

## 2013-12-03 NOTE — Progress Notes (Signed)
ANTICOAGULATION CONSULT NOTE - Initial Consult  Pharmacy Consult for Heparin  Indication: chest pain/ACS  No Known Allergies  Patient Measurements: Height: 6' (182.9 cm) Weight: 236 lb 8 oz (107.276 kg) IBW/kg (Calculated) : 77.6  Vital Signs: Temp: 97.6 F (36.4 C) (07/13 0340) Temp src: Oral (07/13 0340) BP: 149/75 mmHg (07/13 0340) Pulse Rate: 52 (07/13 0340)  Labs:  Recent Labs  12/02/13 2350 12/03/13 0605  HGB 13.2 12.3*  HCT 39.8 37.9*  PLT 125* 124*  CREATININE 1.91*  --   TROPONINI  --  <0.30    Estimated Creatinine Clearance: 39 ml/min (by C-G formula based on Cr of 1.91).   Medical History: Past Medical History  Diagnosis Date  . Arthritis   . Diabetes mellitus without complication   . Broken arm 05/2013    Left  . Hypertension   . Coronary artery disease   . Myocardial infarction     Assessment: 80 y/o M to start heparin for CP. CBC ok, Scr 1.91, troponin negative, other labs as above.   Goal of Therapy:  Heparin level 0.3-0.7 units/ml Monitor platelets by anticoagulation protocol: Yes   Plan:  -Heparin 4000 units BOLUS -Start heparin drip at 1100 units/hr -1530 HL -Daily CBC/HL -Monitor for bleeding  Narda Bonds 12/03/2013,6:42 AM

## 2013-12-03 NOTE — Consult Note (Signed)
CARDIOLOGY CONSULT NOTE   Patient ID: Matthew [name] MRN: [id number], DOB/AGE: October 25, 1930   Admit date: 12/02/2013 Date of Consult: 12/03/2013   Primary Physician: No primary provider on file. Primary Cardiologist: Dr. Lucianne Muss with [facility]. Profile  80 year old Caucasian male with past medical history of hypertension, hyperlipidemia, diabetes, chronic kidney disease and history of coronary artery disease status post CABG x5 over 15 years ago presented with CP. Serial troponin negative  Problem List  Past Medical History  Diagnosis Date  . Arthritis   . Diabetes mellitus without complication   . Broken arm 05/2013    Left  . Hypertension   . Coronary artery disease   . Myocardial infarction     Past Surgical History  Procedure Laterality Date  . Cholecystectomy    . Vein bypass surgery      [place] Bypass Surgery   . Coronary artery bypass graft       Allergies  No Known Allergies  HPI   The patient is a pleasant 80 year old Caucasian male with past medical history of hypertension, hyperlipidemia, diabetes, chronic kidney disease and history of coronary artery disease status post CABG x5 over 15 years ago. He reportedly had CABG at [facility], however no record is available. He has been followed by Dr. Humphrey Rolls at [facility]. He states his last cardiac catheterization was over 5 years ago. He usually has a treadmill stress test every 2 years by Dr. Humphrey Rolls. However he has not had a stress test in the last 2 years. He does not recall when was his last echocardiogram. According to the patient, he has been following with his primary cardiologist every 3-6 month. His last followup was in January 2015 at which time he was doing well. He states he has been staying active at home and tries to walk a mile every single day. He has not experienced any exertional chest pain, increasing dyspnea, or dizziness associated with exertion. He denies any recent fever, chill,  cough, increasing lower extremity edema, orthopnea or paroxysmal nocturnal dyspnea.  Patient was eating dinner on the night of 12/02/2013 around 6 PM when he started having substernal chest pressure radiating to the bilateral arms and bilateral jaws as well. At first he thought it was indigestion and tried both TUMS and Pepcid without relief. He also took sublingual nitroglycerin at home without significant improvement in the symptom. What made him concerned was his symptom was similar to what he experienced prior to his bypass surgery years ago which he originally thought was "indigestion" as well. He also endorsed mild SOB.  Upon presenting to [facility] ED, he was noted to have a blood pressure of 167/72. O2 saturation normal on telemetry. Laboratory finding include creatinine of 1.91, negative troponin, normal CBC. Chest x-ray showed moderate cardiomegaly with no acute process. EKG showed a normal sinus rhythm with frequent PVCs, nonspecific ST-T wave changes. He was admitted by [facility] Medicine team overnight for observation. Serial troponins were obtained which were negative. Patient's chest discomfort eventually resolved around 1 AM after about 7 hours. Echocardiogram was obtained on 12/03/2013 which showed EF 35-40%, severe hypokinesis of the inferolateral and inferior myocardium, peak PA pressure 52. Cardiology was consulted for chest pain.   Inpatient Medications  . aspirin EC  325 mg Oral Daily  . atorvastatin  10 mg Oral q1800  . carvedilol  25 mg Oral BID WC  . [START ON 12/04/2013] famotidine  20 mg Oral Daily  . fenofibrate  160 mg Oral Daily  . glipiZIDE  5 mg Oral Q breakfast  . insulin aspart  0-9 Units Subcutaneous TID WC  . losartan  100 mg Oral Daily  . multivitamin with minerals  1 tablet Oral Daily  . omega-3 acid ethyl esters  2 g Oral BID    Family History Family History  Problem Relation Age of Onset  . Heart attack Father 58    died first MI at age 65     Social  History History   Social History  . Marital Status: Married    Spouse Name: N/A    Number of Children: N/A  . Years of Education: N/A   Occupational History  . Not on file.   Social History Main Topics  . Smoking status: Former Smoker    Types: Cigarettes  . Smokeless tobacco: Not on file     Comment: quit 30 yrs ago, smoked 14-15 yrs, mainly pipe  . Alcohol Use: 0.6 oz/week    1 Glasses of wine per week     Comment: occasionally drinking only  . Drug Use: No  . Sexual Activity: No   Other Topics Concern  . Not on file   Social History Narrative  . No narrative on file     Review of Systems  General:  No chills, fever, night sweats or weight changes.  Cardiovascular:  No dyspnea on exertion, edema, orthopnea, palpitations, paroxysmal nocturnal dyspnea. +chest pain Dermatological: No rash, lesions/masses Respiratory: No cough. +mild dyspnea last night Urologic: No hematuria, dysuria Abdominal:   No nausea, vomiting, diarrhea, bright red blood per rectum, melena, or hematemesis Neurologic:  No visual changes, wkns, changes in mental status. All other systems reviewed and are otherwise negative except as noted above.  Physical Exam  Blood pressure 149/75, pulse 52, temperature 97.6 F (36.4 C), temperature source Oral, resp. rate 13, height 6' (1.829 m), weight 236 lb 8 oz (107.276 kg), SpO2 97.00%.  General: Pleasant, NAD Psych: Normal affect. Neuro: Alert and oriented X 3. Moves all extremities spontaneously.  HEENT: Normal  Neck: Supple without bruits or JVD. Lungs:  Resp regular and unlabored, CTA, mild LUL crackle Heart: RRR no s3, s4, or murmurs. Abdomen: Soft, non-tender, non-distended, BS + x 4.  Extremities: No clubbing, cyanosis or edema. DP/PT 2+ and equal bilaterally. 2+ R radial, 1+ L radial  Labs   Recent Labs  12/03/13 0605 12/03/13 1057  TROPONINI <0.30 <0.30   Lab Results  Component Value Date   WBC 8.0 12/03/2013   HGB 12.3* 12/03/2013     HCT 37.9* 12/03/2013   MCV 91.3 12/03/2013   PLT 124* 12/03/2013     Recent Labs Lab 12/03/13 0605  NA 140  K 4.4  CL 103  CO2 24  BUN 22  CREATININE 1.91*  CALCIUM 9.6  PROT 6.6  BILITOT 0.7  ALKPHOS 37*  ALT 16  AST 20  GLUCOSE 110*    Radiology/Studies  Dg Chest Port 1 View  12/02/2013   CLINICAL DATA:  Chest pain radiating to arms.  EXAM: PORTABLE CHEST - 1 VIEW  COMPARISON:  None.  FINDINGS: The cardiac silhouette appears moderately enlarged, mediastinal silhouette is nonsuspicious, status post median sternotomy. No pleural effusions or focal consolidations. Probable granuloma left mid lung zone. No pneumothorax.  Multiple EKG lines overlie the patient and may obscure subtle underlying pathology. Partially imaged age indeterminate left humeral head fracture with degenerative change. Soft tissue planes are nonsuspicious.  IMPRESSION: Moderate cardiomegaly, no  acute pulmonary process.  Age indeterminate partially imaged left humeral head fracture. Recommend correlation with point tenderness.   Electronically Signed   By: Elon Alas   On: 12/02/2013 23:50    ECG  EKG showed a normal sinus rhythm with frequent PVCs, nonspecific ST-T wave changes.  ASSESSMENT AND PLAN  1. CP concerning for unstable angina  - exercise up to 1 mile a day w/o CP, however symptom similar to previous CABG, need to r/o [place]  - risk factor: HTN, dyslipidemia, DM, former smoker, early family h/o CAD  - given his risk factors, will plan for cath tomorrow, unfortunately unknown anatomy given lack of record, request information from patient's cardiologist  - will need IV hydration overnight given CKD  2. CAD s/p 5v CABG > 15 yr ago  - Echo 7/13 EF 35-40%, severe hypokinesis of the inferolateral and inferior myocardium, peak PA pressure 52.  3. HTN 4. Hyperlipidemia 5. DM 6. CKD  - Cr 1.91   Signed, Almyra Deforest, PA-C 12/03/2013, 1:20 PM

## 2013-12-03 NOTE — ED Notes (Signed)
Dr. Glick at the bedside.  

## 2013-12-03 NOTE — Progress Notes (Signed)
Patient seen and evaluated earlier this a.m. by my associate. Please refer to his H&P for details regarding assessment and plan.  I have consulted cardiology as per plans listed by my associate. Patient is currently n.p.o. we'll place on maintenance IV fluids. We'll adjust CBGs monitoring to every 4 hours  We'll reassess next a.m.  Vita Currin, [employer]

## 2013-12-03 NOTE — Progress Notes (Signed)
ANTICOAGULATION CONSULT NOTE - Follow-up  Pharmacy Consult for Heparin  Indication: chest pain/ACS  No Known Allergies  Patient Measurements: Height: 6' (182.9 cm) Weight: 236 lb 8 oz (107.276 kg) IBW/kg (Calculated) : 77.6 Heparin weight = 100kg  Vital Signs: Temp: 98 F (36.7 C) (07/13 1322) Temp src: Oral (07/13 1322) BP: 169/84 mmHg (07/13 1322) Pulse Rate: 73 (07/13 1322)  Labs:  Recent Labs  12/02/13 2350 12/03/13 0605 12/03/13 1057 12/03/13 1455  HGB 13.2 12.3*  --   --   HCT 39.8 37.9*  --   --   PLT 125* 124*  --   --   HEPARINUNFRC  --   --   --  0.25*  CREATININE 1.91* 1.91*  --   --   TROPONINI  --  <0.30 <0.30  --     Estimated Creatinine Clearance: 39 ml/min (by C-G formula based on Cr of 1.91).  Assessment: 80 y/o M continues on IV heparin for CP. Initial heparin level is slightly below goal at 0.25. No bleeding noted, planning on cath in AM.   Goal of Therapy:  Heparin level 0.3-0.7 units/ml Monitor platelets by anticoagulation protocol: Yes   Plan:  1. Increase heparin gtt to 1300 units/hr 2. Check an 8 hour heparin level 3. Daily heparin level and CBC  Salome Arnt, PharmD, BCPS Pager # [phone] 12/03/2013 4:08 PM

## 2013-12-03 NOTE — Progress Notes (Signed)
Pt arrived from ED and was assisted to bed. Vital signs 97.6-52-16-149/75. Pt states he has no pain or discomfort at this time. Will continue to monitor.

## 2013-12-03 NOTE — Consult Note (Addendum)
Very pleasant gentleman with history of CAD, CABG 15 years ago, DM, CKD 3-4, hypertension and systolic heart failure(EF  35% by echo today) of unknown duration. Had MI prior to CABG. Had cath 3-4 years ago by Dr. Chancy Milroy in [place] with patent grafts. Presents now with prolonged chest pain of ischemic quality but no ECG or marker abnormality. Given age of grafts and decreased LV function, coronary angiography with limited contrast exposure seems most appropriate next step. Does not need an LV gram. Plan hydrate over night, hold ARB but watch for CHF given poor LV function. Schedule left heart cath and bypass/Cor angio for AM. Risk of stroke, death, MI, kidney failure, limb ischemia, bleeding were discussed in detail and accepted.

## 2013-12-04 ENCOUNTER — Encounter (HOSPITAL_COMMUNITY)
Admission: EM | Disposition: A | Discharge status: Home / Self Care | Payer: Self-pay | Source: Home / Self Care | Attending: Interventional Cardiology

## 2013-12-04 DIAGNOSIS — I2510 Atherosclerotic heart disease of native coronary artery without angina pectoris: Principal | ICD-10-CM | POA: Diagnosis present

## 2013-12-04 DIAGNOSIS — I25810 Atherosclerosis of coronary artery bypass graft(s) without angina pectoris: Secondary | ICD-10-CM | POA: Diagnosis present

## 2013-12-04 HISTORY — PX: LEFT HEART CATHETERIZATION WITH CORONARY/GRAFT ANGIOGRAM: SHX5450

## 2013-12-04 LAB — CBC
HCT: 37.8 % — ABNORMAL LOW (ref 39.0–52.0)
Hemoglobin: 12.7 g/dL — ABNORMAL LOW (ref 13.0–17.0)
MCH: 30.5 pg (ref 26.0–34.0)
MCHC: 33.6 g/dL (ref 30.0–36.0)
MCV: 90.9 fL (ref 78.0–100.0)
PLATELETS: 117 10*3/uL — AB (ref 150–400)
RBC: 4.16 MIL/uL — ABNORMAL LOW (ref 4.22–5.81)
RDW: 13.6 % (ref 11.5–15.5)
WBC: 9.3 10*3/uL (ref 4.0–10.5)

## 2013-12-04 LAB — BASIC METABOLIC PANEL
ANION GAP: 14 (ref 5–15)
BUN: 21 mg/dL (ref 6–23)
CALCIUM: 9.4 mg/dL (ref 8.4–10.5)
CO2: 24 mEq/L (ref 19–32)
Chloride: 102 mEq/L (ref 96–112)
Creatinine, Ser: 1.88 mg/dL — ABNORMAL HIGH (ref 0.50–1.35)
GFR calc non Af Amer: 32 mL/min — ABNORMAL LOW (ref >90)
GFR, EST AFRICAN AMERICAN: 37 mL/min — AB (ref >90)
Glucose, Bld: 123 mg/dL — ABNORMAL HIGH (ref 70–99)
Potassium: 4.5 mEq/L (ref 3.7–5.3)
SODIUM: 140 meq/L (ref 137–147)

## 2013-12-04 LAB — GLUCOSE, CAPILLARY
GLUCOSE-CAPILLARY: 126 mg/dL — AB (ref 70–99)
Glucose-Capillary: 111 mg/dL — ABNORMAL HIGH (ref 70–99)
Glucose-Capillary: 117 mg/dL — ABNORMAL HIGH (ref 70–99)
Glucose-Capillary: 128 mg/dL — ABNORMAL HIGH (ref 70–99)
Glucose-Capillary: 148 mg/dL — ABNORMAL HIGH (ref 70–99)
Glucose-Capillary: 148 mg/dL — ABNORMAL HIGH (ref 70–99)
Glucose-Capillary: 157 mg/dL — ABNORMAL HIGH (ref 70–99)

## 2013-12-04 LAB — PROTIME-INR
INR: 1.12 (ref 0.00–1.49)
PROTHROMBIN TIME: 14.4 s (ref 11.6–15.2)

## 2013-12-04 LAB — POCT ACTIVATED CLOTTING TIME: ACTIVATED CLOTTING TIME: 112 s

## 2013-12-04 LAB — HEPARIN LEVEL (UNFRACTIONATED): HEPARIN UNFRACTIONATED: 0.4 [IU]/mL (ref 0.30–0.70)

## 2013-12-04 SURGERY — LEFT HEART CATHETERIZATION WITH CORONARY/GRAFT ANGIOGRAM
Anesthesia: LOCAL | Laterality: Left

## 2013-12-04 MED ORDER — HYDRALAZINE HCL 20 MG/ML IJ SOLN
INTRAMUSCULAR | Status: AC
  Filled 2013-12-04: qty 1

## 2013-12-04 MED ORDER — NITROGLYCERIN IN D5W 200-5 MCG/ML-% IV SOLN
INTRAVENOUS | Status: AC
  Filled 2013-12-04: qty 250

## 2013-12-04 MED ORDER — SODIUM CHLORIDE 0.9 % IV SOLN
1.0000 mL/kg/h | INTRAVENOUS | Status: AC
  Administered 2013-12-04: 1 mL/kg/h via INTRAVENOUS

## 2013-12-04 MED ORDER — NITROGLYCERIN 0.2 MG/ML ON CALL CATH LAB
INTRAVENOUS | Status: AC
  Filled 2013-12-04: qty 1

## 2013-12-04 MED ORDER — HEPARIN (PORCINE) IN NACL 100-0.45 UNIT/ML-% IJ SOLN
1450.0000 [IU]/h | INTRAMUSCULAR | Status: DC
  Administered 2013-12-04: 1300 [IU]/h via INTRAVENOUS
  Administered 2013-12-05 – 2013-12-06 (×2): 1450 [IU]/h via INTRAVENOUS
  Filled 2013-12-04 (×5): qty 250

## 2013-12-04 MED ORDER — FENTANYL CITRATE 0.05 MG/ML IJ SOLN
INTRAMUSCULAR | Status: AC
  Filled 2013-12-04: qty 2

## 2013-12-04 MED ORDER — SODIUM CHLORIDE 0.9 % IJ SOLN: 3.0000 mL | INTRAMUSCULAR | Status: DC | PRN

## 2013-12-04 MED ORDER — SODIUM CHLORIDE 0.9 % IJ SOLN: 3.0000 mL | Freq: Two times a day (BID) | INTRAMUSCULAR | Status: DC

## 2013-12-04 MED ORDER — MIDAZOLAM HCL 2 MG/2ML IJ SOLN
INTRAMUSCULAR | Status: AC
  Filled 2013-12-04: qty 2

## 2013-12-04 MED ORDER — LIDOCAINE HCL (PF) 1 % IJ SOLN
INTRAMUSCULAR | Status: AC
  Filled 2013-12-04: qty 30

## 2013-12-04 MED ORDER — HYDRALAZINE HCL 20 MG/ML IJ SOLN: 20.0000 mg | INTRAMUSCULAR | Status: DC | PRN

## 2013-12-04 MED ORDER — LABETALOL HCL 5 MG/ML IV SOLN
INTRAVENOUS | Status: AC
  Filled 2013-12-04: qty 4

## 2013-12-04 MED ORDER — CLOPIDOGREL BISULFATE 75 MG PO TABS
75.0000 mg | ORAL_TABLET | Freq: Every day | ORAL | Status: DC
  Administered 2013-12-05 – 2013-12-07 (×3): 75 mg via ORAL
  Filled 2013-12-04 (×5): qty 1

## 2013-12-04 MED ORDER — HEPARIN (PORCINE) IN NACL 2-0.9 UNIT/ML-% IJ SOLN
INTRAMUSCULAR | Status: AC
  Filled 2013-12-04: qty 1000

## 2013-12-04 MED ORDER — CLOPIDOGREL BISULFATE 75 MG PO TABS
300.0000 mg | ORAL_TABLET | Freq: Once | ORAL | Status: AC
Start: 2013-12-04 — End: 2013-12-04
  Administered 2013-12-04: 300 mg via ORAL
  Filled 2013-12-04: qty 4

## 2013-12-04 MED ORDER — SODIUM CHLORIDE 0.9 % IV SOLN: 250.0000 mL | INTRAVENOUS | Status: DC | PRN

## 2013-12-04 NOTE — Interval H&P Note (Signed)
History and Physical Interval Note:  12/04/2013 7:55 AM  Matthew [name]  has presented today for surgery, with the diagnosis of Chest Pain concerning for Class II-III Unstable Angina.  He was see by Dr. Tamala Julian in consultation, who noted: Very pleasant gentleman with history of CAD, CABG 15 years ago, DM, CKD 3-4, hypertension and systolic heart failure(EF 35% by echo today) of unknown duration. Had MI prior to CABG. Had cath 3-4 years ago by Dr. Chancy Milroy in [place] with patent grafts.  Presents now with prolonged chest pain of ischemic quality but no ECG or marker abnormality.  Given age of grafts and decreased LV function, coronary angiography with limited contrast exposure seems most appropriate next step. Does not need an LV gram.  Plan hydrate over night, hold ARB but watch for CHF given poor LV function.  Schedule left heart cath and bypass/Cor angio for AM.  Risk of stroke, death, MI, kidney failure, limb ischemia, bleeding were discussed in detail and accepted.   The various methods of treatment have been discussed with the patient and family. After consideration of risks, benefits and other options for treatment, the patient has consented to  Procedure(s): LEFT HEART CATHETERIZATION WITH CORONARY/GRAFT ANGIOGRAM (Left) as a surgical intervention .  The patient's history has been reviewed, patient examined, no change in status, stable for surgery.  I have reviewed the patient's chart and labs.  Questions were answered to the patient's satisfaction.     HARDING,DAVID W  Cath Lab Visit (complete for each Cath Lab visit)  Clinical Evaluation Leading to the Procedure:   ACS: Yes.    Non-ACS:    Anginal Classification: CCS III  Anti-ischemic medical therapy: Minimal Therapy (1 class of medications)  Non-Invasive Test Results: No non-invasive testing performed  Prior CABG: Previous CABG

## 2013-12-04 NOTE — H&P (View-Only) (Signed)
Very pleasant gentleman with history of CAD, CABG 15 years ago, DM, CKD 3-4, hypertension and systolic heart failure(EF  35% by echo today) of unknown duration. Had MI prior to CABG. Had cath 3-4 years ago by Dr. Chancy Milroy in [place] with patent grafts. Presents now with prolonged chest pain of ischemic quality but no ECG or marker abnormality. Given age of grafts and decreased LV function, coronary angiography with limited contrast exposure seems most appropriate next step. Does not need an LV gram. Plan hydrate over night, hold ARB but watch for CHF given poor LV function. Schedule left heart cath and bypass/Cor angio for AM. Risk of stroke, death, MI, kidney failure, limb ischemia, bleeding were discussed in detail and accepted.

## 2013-12-04 NOTE — H&P (View-Only) (Signed)
CARDIOLOGY CONSULT NOTE   Patient ID: Matthew [name] MRN: [id number], DOB/AGE: 78/08/1930   Admit date: 12/02/2013 Date of Consult: 12/03/2013   Primary Physician: No primary provider on file. Primary Cardiologist: Dr. Lucianne Muss with [facility]. Profile  80 year old Caucasian male with past medical history of hypertension, hyperlipidemia, diabetes, chronic kidney disease and history of coronary artery disease status post CABG x5 over 15 years ago presented with CP. Serial troponin negative  Problem List  Past Medical History  Diagnosis Date  . Arthritis   . Diabetes mellitus without complication   . Broken arm 05/2013    Left  . Hypertension   . Coronary artery disease   . Myocardial infarction     Past Surgical History  Procedure Laterality Date  . Cholecystectomy    . Vein bypass surgery      [place] Bypass Surgery   . Coronary artery bypass graft       Allergies  No Known Allergies  HPI   The patient is a pleasant 80 year old Caucasian male with past medical history of hypertension, hyperlipidemia, diabetes, chronic kidney disease and history of coronary artery disease status post CABG x5 over 15 years ago. He reportedly had CABG at [facility], however no record is available. He has been followed by Dr. Humphrey Rolls at [facility]. He states his last cardiac catheterization was over 5 years ago. He usually has a treadmill stress test every 2 years by Dr. Humphrey Rolls. However he has not had a stress test in the last 2 years. He does not recall when was his last echocardiogram. According to the patient, he has been following with his primary cardiologist every 3-6 month. His last followup was in January 2015 at which time he was doing well. He states he has been staying active at home and tries to walk a mile every single day. He has not experienced any exertional chest pain, increasing dyspnea, or dizziness associated with exertion. He denies any recent fever, chill,  cough, increasing lower extremity edema, orthopnea or paroxysmal nocturnal dyspnea.  Patient was eating dinner on the night of 12/02/2013 around 6 PM when he started having substernal chest pressure radiating to the bilateral arms and bilateral jaws as well. At first he thought it was indigestion and tried both TUMS and Pepcid without relief. He also took sublingual nitroglycerin at home without significant improvement in the symptom. What made him concerned was his symptom was similar to what he experienced prior to his bypass surgery years ago which he originally thought was "indigestion" as well. He also endorsed mild SOB.  Upon presenting to [facility] ED, he was noted to have a blood pressure of 167/72. O2 saturation normal on telemetry. Laboratory finding include creatinine of 1.91, negative troponin, normal CBC. Chest x-ray showed moderate cardiomegaly with no acute process. EKG showed a normal sinus rhythm with frequent PVCs, nonspecific ST-T wave changes. He was admitted by [facility] Medicine team overnight for observation. Serial troponins were obtained which were negative. Patient's chest discomfort eventually resolved around 1 AM after about 7 hours. Echocardiogram was obtained on 12/03/2013 which showed EF 35-40%, severe hypokinesis of the inferolateral and inferior myocardium, peak PA pressure 52. Cardiology was consulted for chest pain.   Inpatient Medications  . aspirin EC  325 mg Oral Daily  . atorvastatin  10 mg Oral q1800  . carvedilol  25 mg Oral BID WC  . [START ON 12/04/2013] famotidine  20 mg Oral Daily  . fenofibrate  160 mg Oral Daily  . glipiZIDE  5 mg Oral Q breakfast  . insulin aspart  0-9 Units Subcutaneous TID WC  . losartan  100 mg Oral Daily  . multivitamin with minerals  1 tablet Oral Daily  . omega-3 acid ethyl esters  2 g Oral BID    Family History Family History  Problem Relation Age of Onset  . Heart attack Father 65    died first MI at age 57     Social  History History   Social History  . Marital Status: Married    Spouse Name: N/A    Number of Children: N/A  . Years of Education: N/A   Occupational History  . Not on file.   Social History Main Topics  . Smoking status: Former Smoker    Types: Cigarettes  . Smokeless tobacco: Not on file     Comment: quit 30 yrs ago, smoked 14-15 yrs, mainly pipe  . Alcohol Use: 0.6 oz/week    1 Glasses of wine per week     Comment: occasionally drinking only  . Drug Use: No  . Sexual Activity: No   Other Topics Concern  . Not on file   Social History Narrative  . No narrative on file     Review of Systems  General:  No chills, fever, night sweats or weight changes.  Cardiovascular:  No dyspnea on exertion, edema, orthopnea, palpitations, paroxysmal nocturnal dyspnea. +chest pain Dermatological: No rash, lesions/masses Respiratory: No cough. +mild dyspnea last night Urologic: No hematuria, dysuria Abdominal:   No nausea, vomiting, diarrhea, bright red blood per rectum, melena, or hematemesis Neurologic:  No visual changes, wkns, changes in mental status. All other systems reviewed and are otherwise negative except as noted above.  Physical Exam  Blood pressure 149/75, pulse 52, temperature 97.6 F (36.4 C), temperature source Oral, resp. rate 13, height 6' (1.829 m), weight 236 lb 8 oz (107.276 kg), SpO2 97.00%.  General: Pleasant, NAD Psych: Normal affect. Neuro: Alert and oriented X 3. Moves all extremities spontaneously.  HEENT: Normal  Neck: Supple without bruits or JVD. Lungs:  Resp regular and unlabored, CTA, mild LUL crackle Heart: RRR no s3, s4, or murmurs. Abdomen: Soft, non-tender, non-distended, BS + x 4.  Extremities: No clubbing, cyanosis or edema. DP/PT 2+ and equal bilaterally. 2+ R radial, 1+ L radial  Labs   Recent Labs  12/03/13 0605 12/03/13 1057  TROPONINI <0.30 <0.30   Lab Results  Component Value Date   WBC 8.0 12/03/2013   HGB 12.3* 12/03/2013     HCT 37.9* 12/03/2013   MCV 91.3 12/03/2013   PLT 124* 12/03/2013     Recent Labs Lab 12/03/13 0605  NA 140  K 4.4  CL 103  CO2 24  BUN 22  CREATININE 1.91*  CALCIUM 9.6  PROT 6.6  BILITOT 0.7  ALKPHOS 37*  ALT 16  AST 20  GLUCOSE 110*    Radiology/Studies  Dg Chest Port 1 View  12/02/2013   CLINICAL DATA:  Chest pain radiating to arms.  EXAM: PORTABLE CHEST - 1 VIEW  COMPARISON:  None.  FINDINGS: The cardiac silhouette appears moderately enlarged, mediastinal silhouette is nonsuspicious, status post median sternotomy. No pleural effusions or focal consolidations. Probable granuloma left mid lung zone. No pneumothorax.  Multiple EKG lines overlie the patient and may obscure subtle underlying pathology. Partially imaged age indeterminate left humeral head fracture with degenerative change. Soft tissue planes are nonsuspicious.  IMPRESSION: Moderate cardiomegaly, no  acute pulmonary process.  Age indeterminate partially imaged left humeral head fracture. Recommend correlation with point tenderness.   Electronically Signed   By: Elon Alas   On: 12/02/2013 23:50    ECG  EKG showed a normal sinus rhythm with frequent PVCs, nonspecific ST-T wave changes.  ASSESSMENT AND PLAN  1. CP concerning for unstable angina  - exercise up to 1 mile a day w/o CP, however symptom similar to previous CABG, need to r/o [place]  - risk factor: HTN, dyslipidemia, DM, former smoker, early family h/o CAD  - given his risk factors, will plan for cath tomorrow, unfortunately unknown anatomy given lack of record, request information from patient's cardiologist  - will need IV hydration overnight given CKD  2. CAD s/p 5v CABG > 15 yr ago  - Echo 7/13 EF 35-40%, severe hypokinesis of the inferolateral and inferior myocardium, peak PA pressure 52.  3. HTN 4. Hyperlipidemia 5. DM 6. CKD  - Cr 1.91   Signed, Almyra Deforest, PA-C 12/03/2013, 1:20 PM

## 2013-12-04 NOTE — CV Procedure (Signed)
CARDIAC CATHETERIZATION REPORT  NAME:  Matthew [name]   MRN: [id number] DOB:  1930/08/13   ADMIT DATE: 12/02/2013 Procedure Date: 12/04/2013  INTERVENTIONAL CARDIOLOGIST: Leonie Man, M.D., MS PRIMARY CARE PROVIDER: No primary provider on file. PRIMARY CARDIOLOGIST: Dr. Humphrey Rolls - [facility])  PATIENT:  Matthew [name] is a 80 y.o. male with prior history of CABG (LIMA-LAD, SVG-RPDA, SVG-Diag, SVG-OM1-OM2) in ~2000& Ischemic CM - EF ~30-35%.  Last cath was ~5 yrs ago, last appt with cardiology in Jan 2015 & was doing well.  Presented to [facility] ER on 7/12 with sudden onset SSCP radiating to arms & jaw while eating dinner.  SSx consistent with he pre-CABG angina - Unstable Angina. Ruled out for MI.  Seen by Dr. Linard Millers in consultation, who recommended LHC-Angio.  PRE-OPERATIVE DIAGNOSIS:    Unstable Angina  PROCEDURES PERFORMED:    Left Heart Catheterization with Native Coronary and Graft Angiography  via Right Common Femoral Artery   PROCEDURE: The patient was brought to the 2nd [facility] Lab in the fasting state and prepped and draped in the usual sterile fashion for Right Common Femoral artery access. Radial access was not used in order to potentially save contrast given CKD-III.  Sterile technique was used including antiseptics, cap, gloves, gown, hand hygiene, mask and sheet. Skin prep: Chlorhexidine.   Consent: Risks of procedure as well as the alternatives and risks of each were explained to the (patient/caregiver). Consent for procedure obtained.   Time Out: Verified patient identification, verified procedure, site/side was marked, verified correct patient position, special equipment/implants available, medications/allergies/relevent history reviewed, required imaging and test results available. Performed.  Access:   Right Common Femora Artery: 5 Fr Sheath -  fluoroscopically guided modified Seldinger Technique   Left Heart Catheterization:  5Fr Catheters advanced or exchanged over a J-wire; JL4 catheter advanced first.  Left Coronary Artery Cineangiography: JL4 Catheter  Right Coronary Artery, SVG-Diag, SVG-OM2-OM3 & SVG-OM1 Cineangiography: JR4 Catheter  Non-selective LIMA-LAD Cineangiography: IMA Catheter redirected into Left Subclavian Artery & exchanged over long-exchange wire for IMA catheter. (Non-selective with BP cuff on L arm)  LV Hemodynamics (LV Gram - deferred): Angled Pigtail  Sheath removed in the PACU Holding area with manual pressure for hemostasis.   FINDINGS:  Hemodynamics:   Central Aortic Pressure / Mean: 169/82/151mmHg  Left Ventricular Pressure / LVEDP: 168/16/27 mmHg  Left Ventriculography: Deferred  Coronary& Graft Anatomy:  Dominance: Right  Left Main: Large caliber, bifurcates into LAD & Circumflex. LAD: Large caliber vessel with tandem 95% calcified lesions proximally & 100% occluded just after SP2.  LIMA-LAD: widely patent graft to a small to moderate caliber LAD.  Mild ~40% anastomotic disease. (very difficult to engage due to tortuous L Subclavian artery)  SVG-Diag: Widely patent, patulous graft to a moderate caliber major diagonal branch.  No antegrade disease. Retrograde flow to a proximal branch and almost to the LAD with competitive flow.  Left Circumflex: 100% occluded after a very small proximal branch.  SVG-OM1-2: Large caliber, widely patent graft to a moderate to large caliber OM1 with a patent sequential limb to OM2.    OM1: Minimal disease in the OM1 after initial anastomosis.    OM2: Moderate caliber vessel, minimal distal disease; bifurcates distally.  Retrograde flow perfuses OM3 that is also moderate caliber & free of significant disease.   RCA: Large caliber native vessel with proximal & mid 30-40% lesion.  The vessel terminates in the Right Posterior AV Groove Branch (RPAV) that  gives off several moderate & small caliber RPL branches.  The RPDA is ostially  occluded.  SVG-rPDA: large caliber graft that is patent to a moderate caliber rPDA.  The PDA is minimally diseased, but the SVG has a near ostial ~90% stenosis followed by a long (>30 mm) 70-90% tubular stenosis up to a mid graft valve.  The remainder of the graft is relatively disease free.  After reviewing the initial angiography, the culprit lesion was thought to be there near ostial 90% with long 70-90% stenosis of the SVG-rPDA.  The decision was made to plan for staged PCI on this lesion after adequate post-cath hydration.  He has CKD-III with baseline Cr. of 1.9, significant systemic HTN with severely elevated LVEDP & known EF of ~35%.  MEDICATIONS:  Anesthesia:  Local Lidocaine 18 ml  Sedation:  2 mg IV Versed, 50 mcg IV fentanyl ;   Omnipaque Contrast: 120 ml  IV Hydralazine - 10mg    IV Labetalol - 10 mg  PATIENT DISPOSITION:    The patient was transferred to the PACU holding area in a hemodynamicaly stable, chest pain free condition.  The patient tolerated the procedure well, and there were no complications.  EBL:   < 10 ml  The patient was stable before, during, and after the procedure.  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  Severe ~ostial and proximal 70-90% stenosis (long lesion) of the SVG-RPDA as culprit lesion  Severely elevated LVEDP with significant systemic HTN  CKD III with baseline Cr of 1.9  PLAN OF CARE:  I think with his confounding risk factors noted above - long, ostial SVG lesion in a patient with renal insufficiency, elevated LVEDP & known cardiomyopathy, the best plan of action is to stage PCI of the SVG lesion after allowing time for hydration & potential diuresis.  Post-cath hydration, but monitor BP & respiratory status - diurese as needed.  Anticipate potential Staged PCI tomorrow PM based upon renal function.  Will load with Plavix & restart IV Heparin.   Leonie Man,  M.D., M.S. [facility] [address]. [place], [place]  [address]  [phone]  12/04/2013 8:55 AM

## 2013-12-04 NOTE — Progress Notes (Signed)
[facility] for Heparin  Indication: chest pain/ACS  No Known Allergies  Patient Measurements: Height: 6' (182.9 cm) Weight: 236 lb 8 oz (107.276 kg) IBW/kg (Calculated) : 77.6 Heparin weight = 100kg  Vital Signs: Temp: 99.4 F (37.4 C) (07/14 0000) Temp src: Oral (07/14 0000) BP: 142/66 mmHg (07/14 0000) Pulse Rate: 74 (07/14 0000)  Labs:  Recent Labs  12/02/13 2350 12/03/13 0605 12/03/13 1057 12/03/13 1455 12/04/13 0120  HGB 13.2 12.3*  --   --  12.7*  HCT 39.8 37.9*  --   --  37.8*  PLT 125* 124*  --   --  117*  LABPROT  --   --   --   --  14.4  INR  --   --   --   --  1.12  HEPARINUNFRC  --   --   --  0.25* 0.40  CREATININE 1.91* 1.91*  --   --  1.88*  TROPONINI  --  <0.30 <0.30 <0.30  --     Estimated Creatinine Clearance: 39.7 ml/min (by C-G formula based on Cr of 1.88).  Assessment: 80 y.o. male with chest pain for heparin  Goal of Therapy:  Heparin level 0.3-0.7 units/ml Monitor platelets by anticoagulation protocol: Yes   Plan:  Continue Heparin at current rate  Phillis Knack, PharmD, BCPS   12/04/2013 3:41 AM

## 2013-12-04 NOTE — Progress Notes (Signed)
Right femoral arterial sheath removed by Loistine Chance

## 2013-12-04 NOTE — Progress Notes (Signed)
ANTICOAGULATION CONSULT NOTE - Follow-up  Pharmacy Consult for Heparin  Indication: chest pain/ACS  No Known Allergies  Patient Measurements: Height: 6' (182.9 cm) Weight: 232 lb 6.4 oz (105.416 kg) IBW/kg (Calculated) : 77.6 Heparin weight = 100kg  Vital Signs: Temp: 98.9 F (37.2 C) (07/14 0400) Temp src: Oral (07/14 0400) BP: 173/96 mmHg (07/14 0956) Pulse Rate: 73 (07/14 0934)  Recent Labs  12/02/13 2350 12/03/13 0605 12/03/13 1057 12/03/13 1455 12/04/13 0120  HGB 13.2 12.3*  --   --  12.7*  HCT 39.8 37.9*  --   --  37.8*  PLT 125* 124*  --   --  117*  LABPROT  --   --   --   --  14.4  INR  --   --   --   --  1.12  HEPARINUNFRC  --   --   --  0.25* 0.40  CREATININE 1.91* 1.91*  --   --  1.88*  TROPONINI  --  <0.30 <0.30 <0.30  --     Estimated Creatinine Clearance: 39.3 ml/min (by C-G formula based on Cr of 1.88).  Assessment: 80 y/o M restarting on IV heparin for CP post-cath (8hr post sheath removal). Therapeutic at previous rate. No bleeding noted. Planned PCI tomorrow AM.  Goal of Therapy:  Heparin level 0.3-0.7 units/ml Monitor platelets by anticoagulation protocol: Yes   Plan:  1. Restart heparin gtt to 1300 units/hr tonight 2. Check an 8 hour heparin level 3. Daily heparin level and CBC  Drucie Opitz, PharmD Clinical Pharmacy Resident Pager: [phone] 12/04/2013 11:24 AM

## 2013-12-04 NOTE — Progress Notes (Signed)
Site area:   Site Prior to Removal:  Level   Pressure Applied For 20  MINUTES  . Sheath removed by Loistine Chance  Manual:   yes  Patient Status During Pull:  stable  Post Pull Groin Site:  Level 0  Post Pull Instructions Given:  yes  Post Pull Pulses Present: rt PT palpable  Dressing Applied:  tegaderm dressing applied   Bedrest begins 0935  Comments No complications

## 2013-12-04 NOTE — Progress Notes (Signed)
[facility] PROGRESS NOTE  Matthew [name] [id number] DOB: [birth year] DOA: 12/02/2013 PCP: No primary provider on file.  Assessment/Plan: Principal Problem:   Unstable angina; Class III Angina - Cardiology on board and managing. Patient is status post Cath  Active Problems:   Diabetes mellitus type 2, uncomplicated - Patient currently on sliding scale insulin - Stable    HLD (hyperlipidemia) - Patient currently on Lovaza and atorvastatin. Stable    HTN (hypertension) - Patient is currently taking Coreg - Will continue to monitor blood pressures  Code Status: Full Family Communication: Discussed with patient at bedside  Disposition Plan: Pending improvement in condition.   Consultants:  Cardiologist  Procedures:  As mentioned above.  Antibiotics:  None  HPI/Subjective: Patient has no new complaints.  Objective: Filed Vitals:   12/04/13 1412  BP: 152/94  Pulse: 91  Temp: 97.8 F (36.6 C)  Resp: 20    Intake/Output Summary (Last 24 hours) at 12/04/13 1815 Last data filed at 12/04/13 0658  Gross per 24 hour  Intake 1637.6 ml  Output   1600 ml  Net   37.6 ml   Filed Weights   12/02/13 2244 12/03/13 0340 12/04/13 0400  Weight: 108.863 kg (240 lb) 107.276 kg (236 lb 8 oz) 105.416 kg (232 lb 6.4 oz)    Exam:   General:  Patient in no acute distress, alert and awake  Cardiovascular: Positive S1 and S2, no rubs  Respiratory: Clear to auscultation, no wheezes, no increased work of breathing  Abdomen: Soft, nondistended, and  Musculoskeletal: No cyanosis or clubbing   Data Reviewed: Basic Metabolic Panel:  Recent Labs Lab 12/02/13 2350 12/03/13 0605 12/04/13 0120  NA 139 140 140  K 4.8 4.4 4.5  CL 100 103 102  CO2 23 24 24   GLUCOSE 160* 110* 123*  BUN 24* 22 21  CREATININE 1.91* 1.91* 1.88*  CALCIUM 9.7 9.6 9.4   Liver Function Tests:  Recent Labs Lab 12/03/13 0605  AST 20  ALT 16  ALKPHOS 37*  BILITOT 0.7  PROT 6.6   ALBUMIN 3.6   No results found for this basename: LIPASE, AMYLASE,  in the last 168 hours No results found for this basename: AMMONIA,  in the last 168 hours CBC:  Recent Labs Lab 12/02/13 2350 12/03/13 0605 12/04/13 0120  WBC 7.5 8.0 9.3  NEUTROABS 5.8 5.2  --   HGB 13.2 12.3* 12.7*  HCT 39.8 37.9* 37.8*  MCV 92.8 91.3 90.9  PLT 125* 124* 117*   Cardiac Enzymes:  Recent Labs Lab 12/03/13 0605 12/03/13 1057 12/03/13 1455  TROPONINI <0.30 <0.30 <0.30   BNP (last 3 results) No results found for this basename: PROBNP,  in the last 8760 hours CBG:  Recent Labs Lab 12/04/13 0004 12/04/13 0418 12/04/13 0911 12/04/13 1149 12/04/13 1637  GLUCAP 126* 128* 117* 111* 157*    No results found for this or any previous visit (from the past 240 hour(s)).   Studies: Dg Chest Port 1 View  12/02/2013   CLINICAL DATA:  Chest pain radiating to arms.  EXAM: PORTABLE CHEST - 1 VIEW  COMPARISON:  None.  FINDINGS: The cardiac silhouette appears moderately enlarged, mediastinal silhouette is nonsuspicious, status post median sternotomy. No pleural effusions or focal consolidations. Probable granuloma left mid lung zone. No pneumothorax.  Multiple EKG lines overlie the patient and may obscure subtle underlying pathology. Partially imaged age indeterminate left humeral head fracture with degenerative change. Soft tissue planes are nonsuspicious.  IMPRESSION: Moderate cardiomegaly, no acute  pulmonary process.  Age indeterminate partially imaged left humeral head fracture. Recommend correlation with point tenderness.   Electronically Signed   By: Elon Alas   On: 12/02/2013 23:50    Scheduled Meds: . aspirin EC  325 mg Oral Daily  . atorvastatin  10 mg Oral q1800  . carvedilol  25 mg Oral BID WC  . [START ON 12/05/2013] clopidogrel  75 mg Oral Q breakfast  . famotidine  20 mg Oral Daily  . fenofibrate  160 mg Oral Daily  . insulin aspart  0-9 Units Subcutaneous TID WC  .  multivitamin with minerals  1 tablet Oral Daily  . omega-3 acid ethyl esters  2 g Oral BID  . sodium chloride  3 mL Intravenous Q12H   Continuous Infusions: . sodium chloride 10 mL/hr at 12/03/13 0415  . dextrose 5 % and 0.45 % NaCl with KCl 20 mEq/L 50 mL/hr at 12/03/13 1315  . heparin 1,300 Units/hr (12/04/13 1752)     Time spent: > 35 minutes    Velvet Bathe  [facility] Pager [phone]. If 7PM-7AM, please contact night-coverage at [email or web address], password [facility] 12/04/2013, 6:15 PM  LOS: 2 days

## 2013-12-05 DIAGNOSIS — I200 Unstable angina: Secondary | ICD-10-CM

## 2013-12-05 DIAGNOSIS — N289 Disorder of kidney and ureter, unspecified: Secondary | ICD-10-CM

## 2013-12-05 DIAGNOSIS — I25810 Atherosclerosis of coronary artery bypass graft(s) without angina pectoris: Secondary | ICD-10-CM

## 2013-12-05 DIAGNOSIS — I4949 Other premature depolarization: Secondary | ICD-10-CM

## 2013-12-05 LAB — GLUCOSE, CAPILLARY
GLUCOSE-CAPILLARY: 119 mg/dL — AB (ref 70–99)
GLUCOSE-CAPILLARY: 171 mg/dL — AB (ref 70–99)
Glucose-Capillary: 132 mg/dL — ABNORMAL HIGH (ref 70–99)
Glucose-Capillary: 215 mg/dL — ABNORMAL HIGH (ref 70–99)
Glucose-Capillary: 140 mg/dL — ABNORMAL HIGH (ref 70–99)

## 2013-12-05 LAB — HEPARIN LEVEL (UNFRACTIONATED)
HEPARIN UNFRACTIONATED: 0.37 [IU]/mL (ref 0.30–0.70)
Heparin Unfractionated: 0.1 IU/mL — ABNORMAL LOW (ref 0.30–0.70)
Heparin Unfractionated: 0.53 IU/mL (ref 0.30–0.70)

## 2013-12-05 LAB — CBC
HEMATOCRIT: 36.4 % — AB (ref 39.0–52.0)
Hemoglobin: 12.2 g/dL — ABNORMAL LOW (ref 13.0–17.0)
MCH: 30.7 pg (ref 26.0–34.0)
MCHC: 33.5 g/dL (ref 30.0–36.0)
MCV: 91.5 fL (ref 78.0–100.0)
Platelets: 98 10*3/uL — ABNORMAL LOW (ref 150–400)
RBC: 3.98 MIL/uL — ABNORMAL LOW (ref 4.22–5.81)
RDW: 13.5 % (ref 11.5–15.5)
WBC: 9 10*3/uL (ref 4.0–10.5)

## 2013-12-05 LAB — BASIC METABOLIC PANEL
Anion gap: 18 — ABNORMAL HIGH (ref 5–15)
BUN: 22 mg/dL (ref 6–23)
CHLORIDE: 101 meq/L (ref 96–112)
CO2: 17 meq/L — AB (ref 19–32)
Calcium: 8.9 mg/dL (ref 8.4–10.5)
Creatinine, Ser: 1.86 mg/dL — ABNORMAL HIGH (ref 0.50–1.35)
GFR calc Af Amer: 38 mL/min — ABNORMAL LOW (ref >90)
GFR calc non Af Amer: 33 mL/min — ABNORMAL LOW (ref >90)
Glucose, Bld: 151 mg/dL — ABNORMAL HIGH (ref 70–99)
POTASSIUM: 4.1 meq/L (ref 3.7–5.3)
SODIUM: 136 meq/L — AB (ref 137–147)

## 2013-12-05 MED ORDER — SODIUM CHLORIDE 0.9 % IJ SOLN: 3.0000 mL | Freq: Two times a day (BID) | INTRAMUSCULAR | Status: DC

## 2013-12-05 MED ORDER — ASPIRIN 81 MG PO CHEW
81.0000 mg | CHEWABLE_TABLET | ORAL | Status: AC
  Administered 2013-12-06: 81 mg via ORAL
  Filled 2013-12-05: qty 1

## 2013-12-05 MED ORDER — SODIUM CHLORIDE 0.9 % IV SOLN
1.0000 mL/kg/h | INTRAVENOUS | Status: DC
  Administered 2013-12-05 – 2013-12-06 (×2): 1 mL/kg/h via INTRAVENOUS

## 2013-12-05 MED ORDER — SODIUM CHLORIDE 0.9 % IJ SOLN: 3.0000 mL | INTRAMUSCULAR | Status: DC | PRN

## 2013-12-05 MED ORDER — SODIUM CHLORIDE 0.9 % IV SOLN: 250.0000 mL | INTRAVENOUS | Status: DC | PRN

## 2013-12-05 NOTE — Progress Notes (Signed)
ANTICOAGULATION CONSULT NOTE - Follow-up  Pharmacy Consult for Heparin  Indication: chest pain/ACS  No Known Allergies  Patient Measurements: Height: 6' (182.9 cm) Weight: 232 lb 12.8 oz (105.597 kg) IBW/kg (Calculated) : 77.6 Heparin weight = 100kg  Vital Signs: Temp: 98.3 F (36.8 C) (07/15 1656) Temp src: Oral (07/15 1656) BP: 145/60 mmHg (07/15 1656) Pulse Rate: 85 (07/15 1656)  Recent Labs  12/03/13 0605 12/03/13 1057  12/03/13 1455 12/04/13 0120 12/05/13 0126 12/05/13 1058 12/05/13 1914  HGB 12.3*  --   --   --  12.7* 12.2*  --   --   HCT 37.9*  --   --   --  37.8* 36.4*  --   --   PLT 124*  --   --   --  117* 98*  --   --   LABPROT  --   --   --   --  14.4  --   --   --   INR  --   --   --   --  1.12  --   --   --   HEPARINUNFRC  --   --   < > 0.25* 0.40 0.10* 0.37 0.53  CREATININE 1.91*  --   --   --  1.88* 1.86*  --   --   TROPONINI <0.30 <0.30  --  <0.30  --   --   --   --   < > = values in this interval not displayed.  Estimated Creatinine Clearance: 39.8 ml/min (by C-G formula based on Cr of 1.86).  Assessment: 80 y/o M continues on heparin gtt for CP. No bleeding noted. Planned PCI tomorrow AM.  Heparin level remains therapeutic at 0.53.  Goal of Therapy:  Heparin level 0.3-0.7 units/ml Monitor platelets by anticoagulation protocol: Yes   Plan:  1. Continue heparin gtt at 1450 units/hr 2. Daily heparin level and CBC  Heide Guile, PharmD, [facility] Clinical Pharmacist Pager [phone]   12/05/2013 7:52 PM

## 2013-12-05 NOTE — Progress Notes (Signed)
ANTICOAGULATION CONSULT NOTE - Follow-up  Pharmacy Consult for Heparin  Indication: chest pain/ACS  No Known Allergies  Patient Measurements: Height: 6' (182.9 cm) Weight: 232 lb 12.8 oz (105.597 kg) IBW/kg (Calculated) : 77.6 Heparin weight = 100kg  Vital Signs: Temp: 98.1 F (36.7 C) (07/15 0804) Temp src: Oral (07/15 0804) BP: 147/82 mmHg (07/15 0804) Pulse Rate: 82 (07/15 0804)  Recent Labs  12/03/13 0605 12/03/13 1057  12/03/13 1455 12/04/13 0120 12/05/13 0126 12/05/13 1058  HGB 12.3*  --   --   --  12.7* 12.2*  --   HCT 37.9*  --   --   --  37.8* 36.4*  --   PLT 124*  --   --   --  117* 98*  --   LABPROT  --   --   --   --  14.4  --   --   INR  --   --   --   --  1.12  --   --   HEPARINUNFRC  --   --   < > 0.25* 0.40 0.10* 0.37  CREATININE 1.91*  --   --   --  1.88* 1.86*  --   TROPONINI <0.30 <0.30  --  <0.30  --   --   --   < > = values in this interval not displayed.  Estimated Creatinine Clearance: 39.8 ml/min (by C-G formula based on Cr of 1.86).  Assessment: 80 y/o M continues on heparin gtt for CP. No bleeding noted. Planned PCI tomorrow AM. 8hr HL therapeutic at 0.37 after dose adjustment this AM.  Goal of Therapy:  Heparin level 0.3-0.7 units/ml Monitor platelets by anticoagulation protocol: Yes   Plan:  1. Continue heparin gtt at 1450 units/hr 2. Check HL 1800 to confirm dosing 3. Daily heparin level and CBC  Drucie Opitz, PharmD Clinical Pharmacy Resident Pager: [phone] 12/05/2013 11:52 AM

## 2013-12-05 NOTE — Progress Notes (Signed)
Subjective: No chest pain, no SOB  Objective: Vital signs in last 24 hours: Temp:  [97.8 F (36.6 C)-99.2 F (37.3 C)] 98.2 F (36.8 C) (07/15 0409) Pulse Rate:  [72-91] 80 (07/15 0409) Resp:  [16-21] 20 (07/15 0409) BP: (141-174)/(75-103) 141/75 mmHg (07/15 0409) SpO2:  [94 %-100 %] 97 % (07/15 0409) Weight:  [232 lb 12.8 oz (105.597 kg)] 232 lb 12.8 oz (105.597 kg) (07/15 0409) Weight change: 6.4 oz (0.181 kg) Last BM Date: 12/04/13 Intake/Output from previous day: +1008 07/14 0701 - 07/15 0700 In: 1008.3 [P.O.:240; I.V.:768.3] Out: -  Intake/Output this shift:    PE: General:Pleasant affect, NAD Skin:Warm and dry, brisk capillary refill HEENT:normocephalic, sclera clear, mucus membranes moist Neck:supple, no JVD, no bruits  Heart:S1S2 RRR without murmur, gallup, rub or click Lungs:clear without rales, rhonchi, or wheezes [id number], non tender, + BS, do not palpate liver spleen or masses Ext:no lower ext edema, 2+ pedal pulses, 2+ radial pulses Neuro:alert and oriented, MAE, follows commands, + facial symmetry   Lab Results:  Recent Labs  12/04/13 0120 12/05/13 0126  WBC 9.3 9.0  HGB 12.7* 12.2*  HCT 37.8* 36.4*  PLT 117* 98*   BMET  Recent Labs  12/04/13 0120 12/05/13 0126  NA 140 136*  K 4.5 4.1  CL 102 101  CO2 24 17*  GLUCOSE 123* 151*  BUN 21 22  CREATININE 1.88* 1.86*  CALCIUM 9.4 8.9    Recent Labs  12/03/13 1057 12/03/13 1455  TROPONINI <0.30 <0.30    Hepatic Function Panel  Recent Labs  12/03/13 0605  PROT 6.6  ALBUMIN 3.6  AST 20  ALT 16  ALKPHOS 37*  BILITOT 0.7      Studies/Results: Cardiac cath: POST-OPERATIVE DIAGNOSIS:  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  Severe ~ostial and proximal 70-90% stenosis (long lesion) of the SVG-RPDA as culprit lesion  Severely elevated LVEDP with significant systemic HTN  CKD III with baseline Cr of 1.9  Dr. Harding:"I think with  his confounding risk factors noted above - long, ostial SVG lesion in a patient with renal insufficiency, elevated LVEDP & known cardiomyopathy, the best plan of action is to stage PCI of the SVG lesion after allowing time for hydration & potential diuresis. Post-cath hydration, but monitor BP & respiratory status - diurese as needed. Anticipate potential Staged PCI tomorrow PM based upon renal function. Will load with Plavix & restart IV Heparin"    Medications: I have reviewed the patient's current medications. Scheduled Meds: . aspirin EC  325 mg Oral Daily  . atorvastatin  10 mg Oral q1800  . carvedilol  25 mg Oral BID WC  . clopidogrel  75 mg Oral Q breakfast  . famotidine  20 mg Oral Daily  . fenofibrate  160 mg Oral Daily  . insulin aspart  0-9 Units Subcutaneous TID WC  . multivitamin with minerals  1 tablet Oral Daily  . omega-3 acid ethyl esters  2 g Oral BID  . sodium chloride  3 mL Intravenous Q12H   Continuous Infusions: . sodium chloride 10 mL/hr at 12/03/13 0415  . dextrose 5 % and 0.45 % NaCl with KCl 20 mEq/L 50 mL/hr at 12/03/13 1315  . heparin 1,450 Units/hr (12/05/13 0256)   PRN Meds:.sodium chloride, acetaminophen, hydrALAZINE, morphine injection, nitroGLYCERIN, ondansetron (ZOFRAN) IV, sodium chloride  Assessment/Plan: Principal Problem:   Unstable angina; Class III Angina- for PCI today of VG-RPDA  Active Problems:   Diabetes mellitus type 2, uncomplicated- glucose stable   HLD (hyperlipidemia)-treated   HTN (hypertension)- mildly elevated   CAD (coronary artery disease) of bypass graft   CKD-3  Cr stable today similar to yesterday- may need to hold cath until tomorrow.   LOS: 3 days   Time spent with pt. :15 minutes. [facility]  Nurse Practitioner Certified Pager [phone] or after 5pm and on weekends call [phone] 12/05/2013, 8:04 AM   Patient seen and examined. Agree with assessment and plan. No recurrent chest pain. Sinus rhythm with PVC's; rate  in the 80's.  With mod renal insufficiency, favor waiting until tomorrow for PCI to allow further hydration and renal recovery from yesterday's contrast prior to additional contrast. Discussed with patient and wife who agree with plan.   Troy Sine, MD, [facility] 12/05/2013 9:00 AM

## 2013-12-05 NOTE — Progress Notes (Signed)
[facility] PROGRESS NOTE  Matthew [name] [id number] DOB: 02/24/1931 DOA: 12/02/2013 PCP: No primary provider on file.  Assessment/Plan: Principal Problem:   Unstable angina; Class III Angina - Cardiology on board and managing. Patient is status post Cath - Awaiting further evaluation from cardiologist  Active Problems:   Diabetes mellitus type 2, uncomplicated - Patient currently on sliding scale insulin - Stable    HLD (hyperlipidemia) - Patient currently on Lovaza and atorvastatin. Stable    HTN (hypertension) - Patient is currently taking Coreg - Will continue to monitor blood pressures  Code Status: Full Family Communication: Discussed with patient at bedside  Disposition Plan: Pending improvement in condition.  Consultants:  Cardiologist  Procedures:  As mentioned above.  Antibiotics:  None  HPI/Subjective: Patient has no new complaints.  Objective: Filed Vitals:   12/05/13 1100  BP: 124/54  Pulse: 87  Temp: 97.7 F (36.5 C)  Resp: 20    Intake/Output Summary (Last 24 hours) at 12/05/13 1546 Last data filed at 12/05/13 0433  Gross per 24 hour  Intake 1008.34 ml  Output      0 ml  Net 1008.34 ml   Filed Weights   12/03/13 0340 12/04/13 0400 12/05/13 0409  Weight: 107.276 kg (236 lb 8 oz) 105.416 kg (232 lb 6.4 oz) 105.597 kg (232 lb 12.8 oz)    Exam:   General:  Patient in no acute distress, alert and awake  Cardiovascular: Positive S1 and S2, no rubs  Respiratory: Clear to auscultation, no wheezes, no increased work of breathing  Abdomen: Soft, nondistended  Musculoskeletal: No cyanosis or clubbing   Data Reviewed: Basic Metabolic Panel:  Recent Labs Lab 12/02/13 2350 12/03/13 0605 12/04/13 0120 12/05/13 0126  NA 139 140 140 136*  K 4.8 4.4 4.5 4.1  CL 100 103 102 101  CO2 23 24 24  17*  GLUCOSE 160* 110* 123* 151*  BUN 24* 22 21 22   CREATININE 1.91* 1.91* 1.88* 1.86*  CALCIUM 9.7 9.6 9.4 8.9   Liver Function  Tests:  Recent Labs Lab 12/03/13 0605  AST 20  ALT 16  ALKPHOS 37*  BILITOT 0.7  PROT 6.6  ALBUMIN 3.6   No results found for this basename: LIPASE, AMYLASE,  in the last 168 hours No results found for this basename: AMMONIA,  in the last 168 hours CBC:  Recent Labs Lab 12/02/13 2350 12/03/13 0605 12/04/13 0120 12/05/13 0126  WBC 7.5 8.0 9.3 9.0  NEUTROABS 5.8 5.2  --   --   HGB 13.2 12.3* 12.7* 12.2*  HCT 39.8 37.9* 37.8* 36.4*  MCV 92.8 91.3 90.9 91.5  PLT 125* 124* 117* 98*   Cardiac Enzymes:  Recent Labs Lab 12/03/13 0605 12/03/13 1057 12/03/13 1455  TROPONINI <0.30 <0.30 <0.30   BNP (last 3 results) No results found for this basename: PROBNP,  in the last 8760 hours CBG:  Recent Labs Lab 12/04/13 2113 12/04/13 2350 12/05/13 0407 12/05/13 0721 12/05/13 1118  GLUCAP 148* 148* 132* 119* 215*    No results found for this or any previous visit (from the past 240 hour(s)).   Studies: No results found.  Scheduled Meds: . aspirin EC  325 mg Oral Daily  . atorvastatin  10 mg Oral q1800  . carvedilol  25 mg Oral BID WC  . clopidogrel  75 mg Oral Q breakfast  . famotidine  20 mg Oral Daily  . fenofibrate  160 mg Oral Daily  . insulin aspart  0-9 Units Subcutaneous TID WC  .  multivitamin with minerals  1 tablet Oral Daily  . omega-3 acid ethyl esters  2 g Oral BID  . sodium chloride  3 mL Intravenous Q12H   Continuous Infusions: . sodium chloride 10 mL/hr at 12/03/13 0415  . dextrose 5 % and 0.45 % NaCl with KCl 20 mEq/L 50 mL/hr at 12/03/13 1315  . heparin 1,450 Units/hr (12/05/13 0256)     Time spent: > 35 minutes    Velvet Bathe  [facility] Pager [phone]. If 7PM-7AM, please contact night-coverage at [email or web address], password [facility] 12/05/2013, 3:46 PM  LOS: 3 days

## 2013-12-05 NOTE — Progress Notes (Signed)
ANTICOAGULATION CONSULT NOTE - Follow-up  Pharmacy Consult for Heparin  Indication: chest pain/ACS  No Known Allergies  Patient Measurements: Height: 6' (182.9 cm) Weight: 232 lb 6.4 oz (105.416 kg) IBW/kg (Calculated) : 77.6 Heparin weight = 100kg  Vital Signs: Temp: 99.2 F (37.3 C) (07/14 2351) Temp src: Oral (07/14 2351) BP: 153/80 mmHg (07/14 2351) Pulse Rate: 86 (07/14 2351)  Recent Labs  12/02/13 2350 12/03/13 0605 12/03/13 1057 12/03/13 1455 12/04/13 0120 12/05/13 0126  HGB 13.2 12.3*  --   --  12.7* 12.2*  HCT 39.8 37.9*  --   --  37.8* 36.4*  PLT 125* 124*  --   --  117* 98*  LABPROT  --   --   --   --  14.4  --   INR  --   --   --   --  1.12  --   HEPARINUNFRC  --   --   --  0.25* 0.40 0.10*  CREATININE 1.91* 1.91*  --   --  1.88*  --   TROPONINI  --  <0.30 <0.30 <0.30  --   --     Estimated Creatinine Clearance: 39.3 ml/min (by C-G formula based on Cr of 1.88).  Assessment: 80 yo male with CAD awaiting PCI for heparin  Goal of Therapy:  Heparin level 0.3-0.7 units/ml Monitor platelets by anticoagulation protocol: Yes   Plan:  Increase Heparin 1450 units/hr F/U after cath  Phillis Knack, PharmD, BCPS  12/05/2013 2:17 AM

## 2013-12-06 ENCOUNTER — Encounter (HOSPITAL_COMMUNITY)
Admission: EM | Disposition: A | Discharge status: Home / Self Care | Payer: Self-pay | Source: Home / Self Care | Attending: Interventional Cardiology

## 2013-12-06 ENCOUNTER — Other Ambulatory Visit: Payer: Self-pay

## 2013-12-06 DIAGNOSIS — I25810 Atherosclerosis of coronary artery bypass graft(s) without angina pectoris: Secondary | ICD-10-CM

## 2013-12-06 HISTORY — PX: PERCUTANEOUS CORONARY STENT INTERVENTION (PCI-S): SHX5485

## 2013-12-06 LAB — BASIC METABOLIC PANEL
Anion gap: 14 (ref 5–15)
BUN: 22 mg/dL (ref 6–23)
CO2: 23 mEq/L (ref 19–32)
Calcium: 9 mg/dL (ref 8.4–10.5)
Chloride: 102 mEq/L (ref 96–112)
Creatinine, Ser: 1.79 mg/dL — ABNORMAL HIGH (ref 0.50–1.35)
GFR calc Af Amer: 40 mL/min — ABNORMAL LOW (ref >90)
GFR, EST NON AFRICAN AMERICAN: 34 mL/min — AB (ref >90)
GLUCOSE: 141 mg/dL — AB (ref 70–99)
POTASSIUM: 4.2 meq/L (ref 3.7–5.3)
Sodium: 139 mEq/L (ref 137–147)

## 2013-12-06 LAB — CBC
HEMATOCRIT: 34.2 % — AB (ref 39.0–52.0)
Hemoglobin: 11.6 g/dL — ABNORMAL LOW (ref 13.0–17.0)
MCH: 30.5 pg (ref 26.0–34.0)
MCHC: 33.9 g/dL (ref 30.0–36.0)
MCV: 90 fL (ref 78.0–100.0)
Platelets: 109 10*3/uL — ABNORMAL LOW (ref 150–400)
RBC: 3.8 MIL/uL — AB (ref 4.22–5.81)
RDW: 13.3 % (ref 11.5–15.5)
WBC: 8.3 10*3/uL (ref 4.0–10.5)

## 2013-12-06 LAB — GLUCOSE, CAPILLARY
GLUCOSE-CAPILLARY: 130 mg/dL — AB (ref 70–99)
GLUCOSE-CAPILLARY: 140 mg/dL — AB (ref 70–99)
Glucose-Capillary: 129 mg/dL — ABNORMAL HIGH (ref 70–99)
Glucose-Capillary: 165 mg/dL — ABNORMAL HIGH (ref 70–99)
Glucose-Capillary: 166 mg/dL — ABNORMAL HIGH (ref 70–99)

## 2013-12-06 LAB — PROTIME-INR
INR: 1.1 (ref 0.00–1.49)
Prothrombin Time: 14.2 seconds (ref 11.6–15.2)

## 2013-12-06 LAB — HEPARIN LEVEL (UNFRACTIONATED): Heparin Unfractionated: 0.35 IU/mL (ref 0.30–0.70)

## 2013-12-06 LAB — POCT ACTIVATED CLOTTING TIME
ACTIVATED CLOTTING TIME: 309 s
Activated Clotting Time: 259 seconds

## 2013-12-06 SURGERY — PERCUTANEOUS CORONARY STENT INTERVENTION (PCI-S)
Anesthesia: LOCAL

## 2013-12-06 MED ORDER — ONDANSETRON HCL 4 MG/2ML IJ SOLN: 4.0000 mg | Freq: Four times a day (QID) | INTRAMUSCULAR | Status: DC | PRN

## 2013-12-06 MED ORDER — MIDAZOLAM HCL 2 MG/2ML IJ SOLN
INTRAMUSCULAR | Status: AC
  Filled 2013-12-06: qty 2

## 2013-12-06 MED ORDER — ASPIRIN 81 MG PO CHEW
81.0000 mg | CHEWABLE_TABLET | Freq: Every day | ORAL | Status: DC
  Administered 2013-12-07: 10:00:00 81 mg via ORAL
  Filled 2013-12-06: qty 1

## 2013-12-06 MED ORDER — ACETAMINOPHEN 325 MG PO TABS: 650.0000 mg | ORAL_TABLET | ORAL | Status: DC | PRN

## 2013-12-06 MED ORDER — SODIUM CHLORIDE 0.9 % IJ SOLN: 3.0000 mL | INTRAMUSCULAR | Status: DC | PRN

## 2013-12-06 MED ORDER — SODIUM CHLORIDE 0.9 % IV SOLN: 250.0000 mL | INTRAVENOUS | Status: DC | PRN

## 2013-12-06 MED ORDER — CLOPIDOGREL BISULFATE 75 MG PO TABS
ORAL_TABLET | ORAL | Status: AC
  Filled 2013-12-06: qty 1

## 2013-12-06 MED ORDER — HEPARIN SODIUM (PORCINE) 1000 UNIT/ML IJ SOLN
INTRAMUSCULAR | Status: AC
  Filled 2013-12-06: qty 1

## 2013-12-06 MED ORDER — NITROGLYCERIN 1 MG/10 ML FOR IR/CATH LAB
INTRA_ARTERIAL | Status: AC
  Filled 2013-12-06: qty 10

## 2013-12-06 MED ORDER — FENTANYL CITRATE 0.05 MG/ML IJ SOLN
INTRAMUSCULAR | Status: AC
  Filled 2013-12-06: qty 2

## 2013-12-06 MED ORDER — SODIUM CHLORIDE 0.9 % IV SOLN
1.0000 mL/kg/h | INTRAVENOUS | Status: AC
  Administered 2013-12-06: 1 mL/kg/h via INTRAVENOUS

## 2013-12-06 MED ORDER — LIDOCAINE HCL (PF) 1 % IJ SOLN
INTRAMUSCULAR | Status: AC
  Filled 2013-12-06: qty 30

## 2013-12-06 MED ORDER — SODIUM CHLORIDE 0.9 % IJ SOLN: 3.0000 mL | Freq: Two times a day (BID) | INTRAMUSCULAR | Status: DC

## 2013-12-06 MED ORDER — MORPHINE SULFATE 2 MG/ML IJ SOLN: 2.0000 mg | INTRAMUSCULAR | Status: DC | PRN

## 2013-12-06 MED ORDER — HEPARIN (PORCINE) IN NACL 2-0.9 UNIT/ML-% IJ SOLN
INTRAMUSCULAR | Status: AC
  Filled 2013-12-06: qty 1500

## 2013-12-06 NOTE — Interval H&P Note (Signed)
History and Physical Interval Note:  12/06/2013 8:08 AM Creatinine is 1.79, down from 1.91 on admission. He has not had recurrent angina.  Matthew [name]  has presented today for surgery, with the diagnosis of cad  The various methods of treatment have been discussed with the patient and family. After consideration of risks, benefits and other options for treatment, the patient has consented to  Procedure(s): PERCUTANEOUS CORONARY STENT INTERVENTION (PCI-S) (N/A) as a surgical intervention .  The patient's history has been reviewed, patient examined, no change in status, stable for surgery.  I have reviewed the patient's chart and labs.  Questions were answered to the patient's satisfaction.     Sinclair Grooms

## 2013-12-06 NOTE — Progress Notes (Signed)
[facility] PROGRESS NOTE  Matthew [name] [id number] DOB: 10-04-1930 DOA: 12/02/2013 PCP: No primary provider on file.  Assessment/Plan: Principal Problem:   Unstable angina; Class III Angina - Cardiology on board and managing. Patient is status post Cath with 2 drug eluding stents placed.  Active Problems:   Diabetes mellitus type 2, uncomplicated - Patient currently on sliding scale insulin - Stable    HLD (hyperlipidemia) - Patient currently on Lovaza and atorvastatin. Stable    HTN (hypertension) - Patient is currently taking Coreg - Will continue to monitor blood pressures  Code Status: Full Family Communication: Discussed with patient at bedside  Disposition Plan: Pending improvement in condition.  Consultants:  Cardiologist  Procedures:  As mentioned above.  Antibiotics:  None  HPI/Subjective: Patient has no new complaints. Currently feels better  Objective: Filed Vitals:   12/06/13 1300  BP: 152/91  Pulse: 73  Temp:   Resp:     Intake/Output Summary (Last 24 hours) at 12/06/13 1717 Last data filed at 12/06/13 0605  Gross per 24 hour  Intake   14.5 ml  Output    240 ml  Net -225.5 ml   Filed Weights   12/03/13 0340 12/04/13 0400 12/05/13 0409  Weight: 107.276 kg (236 lb 8 oz) 105.416 kg (232 lb 6.4 oz) 105.597 kg (232 lb 12.8 oz)    Exam:   General:  Patient in no acute distress, alert and awake  Cardiovascular: Positive S1 and S2, no rubs  Respiratory: Clear to auscultation, no wheezes, no increased work of breathing  Abdomen: Soft, nondistended  Musculoskeletal: No cyanosis or clubbing   Data Reviewed: Basic Metabolic Panel:  Recent Labs Lab 12/02/13 2350 12/03/13 0605 12/04/13 0120 12/05/13 0126 12/06/13 0616  NA 139 140 140 136* 139  K 4.8 4.4 4.5 4.1 4.2  CL 100 103 102 101 102  CO2 23 24 24  17* 23  GLUCOSE 160* 110* 123* 151* 141*  BUN 24* 22 21 22 22   CREATININE 1.91* 1.91* 1.88* 1.86* 1.79*  CALCIUM 9.7  9.6 9.4 8.9 9.0   Liver Function Tests:  Recent Labs Lab 12/03/13 0605  AST 20  ALT 16  ALKPHOS 37*  BILITOT 0.7  PROT 6.6  ALBUMIN 3.6   No results found for this basename: LIPASE, AMYLASE,  in the last 168 hours No results found for this basename: AMMONIA,  in the last 168 hours CBC:  Recent Labs Lab 12/02/13 2350 12/03/13 0605 12/04/13 0120 12/05/13 0126 12/06/13 0616  WBC 7.5 8.0 9.3 9.0 8.3  NEUTROABS 5.8 5.2  --   --   --   HGB 13.2 12.3* 12.7* 12.2* 11.6*  HCT 39.8 37.9* 37.8* 36.4* 34.2*  MCV 92.8 91.3 90.9 91.5 90.0  PLT 125* 124* 117* 98* 109*   Cardiac Enzymes:  Recent Labs Lab 12/03/13 0605 12/03/13 1057 12/03/13 1455  TROPONINI <0.30 <0.30 <0.30   BNP (last 3 results) No results found for this basename: PROBNP,  in the last 8760 hours CBG:  Recent Labs Lab 12/05/13 1648 12/05/13 2043 12/06/13 0021 12/06/13 0410 12/06/13 0955  GLUCAP 140* 171* 140* 130* 129*    No results found for this or any previous visit (from the past 240 hour(s)).   Studies: No results found.  Scheduled Meds: . [START ON 12/07/2013] aspirin  81 mg Oral Daily  . atorvastatin  10 mg Oral q1800  . carvedilol  25 mg Oral BID WC  . clopidogrel  75 mg Oral Q breakfast  . famotidine  20 mg Oral Daily  . fenofibrate  160 mg Oral Daily  . omega-3 acid ethyl esters  2 g Oral BID  . sodium chloride  3 mL Intravenous Q12H   Continuous Infusions: . sodium chloride 1 mL/kg/hr (12/06/13 1100)     Time spent: > 35 minutes    Velvet Bathe  [facility] Pager [phone]. If 7PM-7AM, please contact night-coverage at [email or web address], password [facility] 12/06/2013, 5:17 PM  LOS: 4 days

## 2013-12-06 NOTE — H&P (View-Only) (Signed)
Subjective: No chest pain, no SOB  Objective: Vital signs in last 24 hours: Temp:  [97.8 F (36.6 C)-99.2 F (37.3 C)] 98.2 F (36.8 C) (07/15 0409) Pulse Rate:  [72-91] 80 (07/15 0409) Resp:  [16-21] 20 (07/15 0409) BP: (141-174)/(75-103) 141/75 mmHg (07/15 0409) SpO2:  [94 %-100 %] 97 % (07/15 0409) Weight:  [232 lb 12.8 oz (105.597 kg)] 232 lb 12.8 oz (105.597 kg) (07/15 0409) Weight change: 6.4 oz (0.181 kg) Last BM Date: 12/04/13 Intake/Output from previous day: +1008 07/14 0701 - 07/15 0700 In: 1008.3 [P.O.:240; I.V.:768.3] Out: -  Intake/Output this shift:    PE: General:Pleasant affect, NAD Skin:Warm and dry, brisk capillary refill HEENT:normocephalic, sclera clear, mucus membranes moist Neck:supple, no JVD, no bruits  Heart:S1S2 RRR without murmur, gallup, rub or click Lungs:clear without rales, rhonchi, or wheezes [id number], non tender, + BS, do not palpate liver spleen or masses Ext:no lower ext edema, 2+ pedal pulses, 2+ radial pulses Neuro:alert and oriented, MAE, follows commands, + facial symmetry   Lab Results:  Recent Labs  12/04/13 0120 12/05/13 0126  WBC 9.3 9.0  HGB 12.7* 12.2*  HCT 37.8* 36.4*  PLT 117* 98*   BMET  Recent Labs  12/04/13 0120 12/05/13 0126  NA 140 136*  K 4.5 4.1  CL 102 101  CO2 24 17*  GLUCOSE 123* 151*  BUN 21 22  CREATININE 1.88* 1.86*  CALCIUM 9.4 8.9    Recent Labs  12/03/13 1057 12/03/13 1455  TROPONINI <0.30 <0.30    Hepatic Function Panel  Recent Labs  12/03/13 0605  PROT 6.6  ALBUMIN 3.6  AST 20  ALT 16  ALKPHOS 37*  BILITOT 0.7      Studies/Results: Cardiac cath: POST-OPERATIVE DIAGNOSIS:  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  Severe ~ostial and proximal 70-90% stenosis (long lesion) of the SVG-RPDA as culprit lesion  Severely elevated LVEDP with significant systemic HTN  CKD III with baseline Cr of 1.9  Dr. Harding:"I think with  his confounding risk factors noted above - long, ostial SVG lesion in a patient with renal insufficiency, elevated LVEDP & known cardiomyopathy, the best plan of action is to stage PCI of the SVG lesion after allowing time for hydration & potential diuresis. Post-cath hydration, but monitor BP & respiratory status - diurese as needed. Anticipate potential Staged PCI tomorrow PM based upon renal function. Will load with Plavix & restart IV Heparin"    Medications: I have reviewed the patient's current medications. Scheduled Meds: . aspirin EC  325 mg Oral Daily  . atorvastatin  10 mg Oral q1800  . carvedilol  25 mg Oral BID WC  . clopidogrel  75 mg Oral Q breakfast  . famotidine  20 mg Oral Daily  . fenofibrate  160 mg Oral Daily  . insulin aspart  0-9 Units Subcutaneous TID WC  . multivitamin with minerals  1 tablet Oral Daily  . omega-3 acid ethyl esters  2 g Oral BID  . sodium chloride  3 mL Intravenous Q12H   Continuous Infusions: . sodium chloride 10 mL/hr at 12/03/13 0415  . dextrose 5 % and 0.45 % NaCl with KCl 20 mEq/L 50 mL/hr at 12/03/13 1315  . heparin 1,450 Units/hr (12/05/13 0256)   PRN Meds:.sodium chloride, acetaminophen, hydrALAZINE, morphine injection, nitroGLYCERIN, ondansetron (ZOFRAN) IV, sodium chloride  Assessment/Plan: Principal Problem:   Unstable angina; Class III Angina- for PCI today of VG-RPDA  Active Problems:   Diabetes mellitus type 2, uncomplicated- glucose stable   HLD (hyperlipidemia)-treated   HTN (hypertension)- mildly elevated   CAD (coronary artery disease) of bypass graft   CKD-3  Cr stable today similar to yesterday- may need to hold cath until tomorrow.   LOS: 3 days   Time spent with pt. :15 minutes. [facility] R  Nurse Practitioner Certified Pager [phone] or after 5pm and on weekends call [phone] 12/05/2013, 8:04 AM   Patient seen and examined. Agree with assessment and plan. No recurrent chest pain. Sinus rhythm with PVC's; rate  in the 80's.  With mod renal insufficiency, favor waiting until tomorrow for PCI to allow further hydration and renal recovery from yesterday's contrast prior to additional contrast. Discussed with patient and wife who agree with plan.   Troy Sine, MD, [facility] 12/05/2013 9:00 AM

## 2013-12-06 NOTE — CV Procedure (Signed)
     PERCUTANEOUS CORONARY INTERVENTION   Matthew [name] is a 80 y.o. male  INDICATION: Unstable angina pectoris, previous bypass grafting 15 years ago, and severe segmental 90% stenosis in the SVG to the PDA   PROCEDURE: 1. Bypass graft angiography; 2. Drug-eluting stent implantation right small to mid saphenous vein graft to the PDA with distal protection   CONSENT: The risks, benefits, and details of the procedure were explained to the patient. Risks including death, MI, stroke, bleeding, limb ischemia, emergency CABG, renal failure and allergy were described and accepted by the patient.  Informed written consent was obtained prior to proceeding.  PROCEDURE TECHNIQUE:  After Xylocaine anesthesia a 6 French sheath was placed in the right femoral artery with a single anterior needle wall stick.   Coronary guiding shots were made using a 6 French A2 multipurpose catheter. Antithrombotic therapy, heparinj, was begun via bolus. ACT documented to be greater than 300.  Antiplatelet therapy, Plavix, was previously loaded loaded.  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 Difficulty cannulating the ostium of the saphenous vein graft was initially encountered due to aortoiliac tortuosity and decreased torque ability. After obtaining guiding shots we performed PCI after placing a 4.0 Spider FX distal protection device into the distal segment of the saphenous vein graft. Predilatation was performed with a 20 mm long by 3.0 mm balloon. A 4.0 x 38 mm Promus Premier was positioned and deployed. A second 4.0 x 28 Promus Premier was overlapped and extended to the ostium. Both stents were deployed at 13 atmospheres. TIMI grade 3 flow was noted. Despite was retrieved.  Angio-Seal was used for hemostasis with good results.  CONTRAST:  Total of 100 cc.  COMPLICATIONS:  None.    ANGIOGRAPHIC RESULTS:   95% segmental ostial to mid SVG stenosis reduced to 0% with TIMI grade 3 flow after DES  implantation. Distal protection was performed.   IMPRESSIONS:  Successful stenting of a segmental ostial to mid SVG to PDA stenosis reducing 95% obstruction to 0% with TIMI grade 3 flow using DES.   RECOMMENDATION:  Aspirin and Plavix for one year Discharge in a.m. if no complications Care with right femoral access site which was Angio-Seal. No ambulation for at least 4 hours post procedure.    Sinclair Grooms, MD 12/06/2013 9:30 AM

## 2013-12-06 NOTE — Care Management Note (Addendum)
    Page 1 of 1   12/07/2013     10:51:04 AM CARE MANAGEMENT NOTE 12/07/2013  Patient:  Matthew [name],Matthew [name]   Account Number:  [id number]  Date Initiated:  12/06/2013  Documentation initiated by:  Fuller Mandril  Subjective/Objective Assessment:   80 year old Caucasian male with past medical history of hypertension, hyperlipidemia, diabetes, chronic kidney disease and hx CAD status post CABG x5 over 15 years ago presented with CP. //Home with spouse.     Action/Plan:   PROCEDURE: 1. Bypass graft angiography; 2. Drug-eluting stent implantation right small to mid saphenous vein graft to the PDA with distal protection//Access for [facility] services.   Anticipated DC Date:  12/07/2013   Anticipated DC Plan:  [place]  CM consult      Choice offered to / List presented to:             Status of service:  Completed, signed off Medicare Important Message given?  YES (If response is "NO", the following Medicare IM given date fields will be blank) Date Medicare IM given:  12/07/2013 Medicare IM given by:  [facility] Date Additional Medicare IM given:   Additional Medicare IM given by:    Discharge Disposition:    Per UR Regulation:  Reviewed for med. necessity/level of care/duration of stay  If discussed at [place] of Stay Meetings, dates discussed:    Comments:

## 2013-12-07 DIAGNOSIS — Z9861 Coronary angioplasty status: Secondary | ICD-10-CM

## 2013-12-07 DIAGNOSIS — I209 Angina pectoris, unspecified: Secondary | ICD-10-CM

## 2013-12-07 LAB — BASIC METABOLIC PANEL
ANION GAP: 16 — AB (ref 5–15)
BUN: 24 mg/dL — ABNORMAL HIGH (ref 6–23)
CALCIUM: 8.9 mg/dL (ref 8.4–10.5)
CO2: 20 mEq/L (ref 19–32)
Chloride: 100 mEq/L (ref 96–112)
Creatinine, Ser: 1.8 mg/dL — ABNORMAL HIGH (ref 0.50–1.35)
GFR, EST AFRICAN AMERICAN: 39 mL/min — AB (ref >90)
GFR, EST NON AFRICAN AMERICAN: 34 mL/min — AB (ref >90)
Glucose, Bld: 136 mg/dL — ABNORMAL HIGH (ref 70–99)
POTASSIUM: 4.1 meq/L (ref 3.7–5.3)
SODIUM: 136 meq/L — AB (ref 137–147)

## 2013-12-07 LAB — CBC
HCT: 34.5 % — ABNORMAL LOW (ref 39.0–52.0)
Hemoglobin: 11.5 g/dL — ABNORMAL LOW (ref 13.0–17.0)
MCH: 29.9 pg (ref 26.0–34.0)
MCHC: 33.3 g/dL (ref 30.0–36.0)
MCV: 89.8 fL (ref 78.0–100.0)
PLATELETS: 116 10*3/uL — AB (ref 150–400)
RBC: 3.84 MIL/uL — ABNORMAL LOW (ref 4.22–5.81)
RDW: 13.4 % (ref 11.5–15.5)
WBC: 8.7 10*3/uL (ref 4.0–10.5)

## 2013-12-07 LAB — GLUCOSE, CAPILLARY: Glucose-Capillary: 134 mg/dL — ABNORMAL HIGH (ref 70–99)

## 2013-12-07 MED ORDER — CLOPIDOGREL BISULFATE 75 MG PO TABS: 75.0000 mg | ORAL_TABLET | Freq: Every day | ORAL | Status: DC

## 2013-12-07 NOTE — Progress Notes (Signed)
CARDIAC REHAB PHASE I   PRE:  Rate/Rhythm: 5 SR with bigimeny PVCS    BP: sitting 158/75    SaO2: 95 RA  MODE:  Ambulation: 500 ft   POST:  Rate/Rhythm: 92 SR with PVCs    BP: sitting 171/107, recheck 151/70     SaO2: 98 RA  Pt denies groin pain. Steady. Noted significant SOB with walking and talking. Pt didn't seem to notice and ? If this is normal for him. To recliner. Ed completed with pt and daughter. Pt interested in CRPII and requests his name be sent to [facility].  [phone]  Josephina Shih Blackey CES, ACSM 12/07/2013 8:59 AM

## 2013-12-07 NOTE — Progress Notes (Signed)
Pt noted to have episodes of sleep apnea, O2 sat will drop to 82% on RA but rises back to 95% when alarm sounds and he is awakened.  No sob noted.  Tele 70's w/ freq PVC's and episodes of bigeminy.  Pt denies complaints.  Has remained in bed due to hematoma, uses urinal without difficulty.

## 2013-12-07 NOTE — Discharge Summary (Signed)
Discharge Summary   Patient ID: Matthew [name],  MRN: [id number], DOB/AGE: 1930/11/16 80 y.o.  Admit date: 12/02/2013 Discharge date: 12/07/2013  Primary Care Provider: No primary provider on file. Primary Cardiologist: Dr. Lucianne Muss - [facility] (@ [facility])   Discharge Diagnoses Principal Problem:   Unstable angina; Class III Angina Active Problems:   Diabetes mellitus type 2, uncomplicated   HLD (hyperlipidemia)   HTN (hypertension)   CAD (coronary artery disease) of bypass graft   Allergies No Known Allergies  Procedures  Transthoracic echocardiogram LV EF: 35% - 40%  ------------------------------------------------------------------- Indications: Chest pain 786.51.  ------------------------------------------------------------------- History: Risk factors: Hypertension. Diabetes mellitus. Dyslipidemia.  ------------------------------------------------------------------- Study Conclusions  - Left ventricle: Systolic function was moderately reduced. The estimated ejection fraction was in the range of 35% to 40%. Severe hypokinesis of the inferolateral and inferior myocardium. Doppler parameters are consistent with restrictive physiology, indicative of decreased left ventricular diastolic compliance and/or increased left atrial pressure. Doppler parameters are consistent with elevated mean left atrial filling pressure. - Aortic valve: There was mild regurgitation. - Mitral valve: There was mild regurgitation. - Left atrium: The atrium was mildly dilated. - Pulmonary arteries: Systolic pressure was moderately increased. PA peak pressure: 52 mm Hg (S).   Cath #1 PROCEDURES PERFORMED:  Left Heart Catheterization with Native Coronary and Graft Angiography via Right Common Femoral Artery   Coronary& Graft Anatomy:  Dominance: Right Left Main: Large caliber, bifurcates into LAD & Circumflex. LAD: Large caliber vessel with tandem 95% calcified lesions  proximally & 100% occluded just after SP2.  LIMA-LAD: widely patent graft to a small to moderate caliber LAD. Mild ~40% anastomotic disease. (very difficult to engage due to tortuous L Subclavian artery)  SVG-Diag: Widely patent, patulous graft to a moderate caliber major diagonal branch. No antegrade disease. Retrograde flow to a proximal branch and almost to the LAD with competitive flow. Left Circumflex: 100% occluded after a very small proximal branch.  SVG-OM1-2: Large caliber, widely patent graft to a moderate to large caliber OM1 with a patent sequential limb to OM2.  OM1: Minimal disease in the OM1 after initial anastomosis.  OM2: Moderate caliber vessel, minimal distal disease; bifurcates distally. Retrograde flow perfuses OM3 that is also moderate caliber & free of significant disease. RCA: Large caliber native vessel with proximal & mid 30-40% lesion. The vessel terminates in the Right Posterior AV Groove Branch (RPAV) that gives off several moderate & small caliber RPL branches. The RPDA is ostially occluded.  SVG-rPDA: large caliber graft that is patent to a moderate caliber rPDA. The PDA is minimally diseased, but the SVG has a near ostial ~90% stenosis followed by a long (>30 mm) 70-90% tubular stenosis up to a mid graft valve. The remainder of the graft is relatively disease free. POST-OPERATIVE DIAGNOSIS:  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  Severe ~ostial and proximal 70-90% stenosis (long lesion) of the SVG-RPDA as culprit lesion  Severely elevated LVEDP with significant systemic HTN  CKD III with baseline Cr of 1.9 PLAN OF CARE:  I think with his confounding risk factors noted above - long, ostial SVG lesion in a patient with renal insufficiency, elevated LVEDP & known cardiomyopathy, the best plan of action is to stage PCI of the SVG lesion after allowing time for hydration & potential diuresis.  Post-cath hydration, but monitor BP &  respiratory status - diurese as needed.  Anticipate potential Staged PCI tomorrow PM based upon renal function.  Will load  with Plavix & restart IV Heparin.   Cath #2 PROCEDURE: 1. Bypass graft angiography; 2. Drug-eluting stent implantation right small to mid saphenous vein graft to the PDA with distal protection ANGIOGRAPHIC RESULTS: 95% segmental ostial to mid SVG stenosis reduced to 0% with TIMI grade 3 flow after DES implantation. Distal protection was performed.  IMPRESSIONS: Successful stenting of a segmental ostial to mid SVG to PDA stenosis reducing 95% obstruction to 0% with TIMI grade 3 flow using DES.  RECOMMENDATION: Aspirin and Plavix for one year  Discharge in a.m. if no complications  Care with right femoral access site which was Angio-Seal. No ambulation for at least 4 hours post procedure.  Hospital Course  The patient is 80 year old Caucasian male with past medical history of hypertension, hyperlipidemia, diabetes, chronic kidney disease with baseline creatinine 1.9, and a history of coronary artery disease status post CABG over 15 years ago. He presented to [facility] on 12/02/2013 after having substernal chest pressure radiating to bilateral arms and bilateral jaws around 6 PM on the day of admission. Patient was eating dinner at the time, at first he thought it was indigestion however both TUMS and Pepcid provided no relief.  The characteristic of his chest discomfort was similar to what he experienced prior to his bypass surgery which prompted him to seek medical attention at local ED.   He was initially admitted by internal medicine and cardiology was consulted. Echocardiogram was obtained on 12/03/2013 which showed EF 35-40%, severe hypokinesis of the inferior lateral and inferior myocardium, peak PA pressure 52. Due to the similarity of his symptom to prior MI, the decision was made to go through with cardiac catheterization. He underwent cardiac catheterization on  12/04/2013 for stable angina which revealed severe native coronary artery disease with known occlusion of the native RPDA, 70-90% ostial and proximal SVG to RPDA stenosis which appeared to be the culprit lesion. Given his chronic kidney disease, a staged PCI was planned at a later date after IV hydration to avoid contrast nephropathy. Patient returned to the cath lab on 12/06/2013 two days of IV hydration. A drug-eluting stent was placed for 95% ostial to mid SVG to PDA stenosis with resultant TIMI grade 3 flow.   Post-cath, patient had a large right femoral hematoma after 2 cardiac cath both via right femoral approach. He was seen the morning of 12/07/2013, the size of hematoma has not increased, patient denies any significant pain, hemoglobin stable, there is no pulsatile mass or bruit on exam, the size of hematoma is stable at this point. Suspicion for pseudoaneurysm is low. Patient also denies any significant chest discomfort or shortness breath overnight. He was seen by cardiac rehabilitation nurse. He is deemed stable for discharge from cardiac perspective. He will followup with his primary cardiologist, Dr. Humphrey Rolls at [facility] after discharge. Patient has been advised to continue to monitor the size of his right femoral hematoma. He will need to call his cardiologist if the size of hematoma increase, sudden onset of dizziness or presyncope, mass become pulsatile, or he experience pain in the right leg.   Of note, his losartan was originally held prior to cardiac catheterization, he will resume his losartan for cardiomyopathy after discharge as his post cath creatinine has been at his baseline.   *Per night shift nursing staff, patient had O2 sat dropped down to 82% with episodes of apnea episodes. Given his coronary disease, it is prudent for outpatient sleep study for evaluation of OSA. I will defer  this to his primary cardiologist.    Discharge Vitals Blood pressure 158/75, pulse 77, temperature  98.7 F (37.1 C), temperature source Oral, resp. rate 18, height 6' (1.829 m), weight 235 lb 10.8 oz (106.9 kg), SpO2 93.00%.  Filed Weights   12/04/13 0400 12/05/13 0409 12/07/13 0012  Weight: 232 lb 6.4 oz (105.416 kg) 232 lb 12.8 oz (105.597 kg) 235 lb 10.8 oz (106.9 kg)    Labs  CBC  Recent Labs  12/06/13 0616 12/07/13 0422  WBC 8.3 8.7  HGB 11.6* 11.5*  HCT 34.2* 34.5*  MCV 90.0 89.8  PLT 109* [id number]*   Basic Metabolic Panel  Recent Labs  12/06/13 0616 12/07/13 0422  NA 139 136*  K 4.2 4.1  CL 102 100  CO2 23 20  GLUCOSE 141* 136*  BUN 22 24*  CREATININE 1.79* 1.80*  CALCIUM 9.0 8.9   Disposition  Pt is being discharged home today in good condition.  Follow-up Plans & Appointments      Follow-up Information   Follow up with [place]. (Discussed with patient, he will call Dr. Laurelyn Sickle office today to schedule post hospital follow up next week)       Discharge Medications    Medication List         aspirin EC 81 MG tablet  Take 81 mg by mouth at bedtime.     carvedilol 25 MG tablet  Commonly known as:  COREG  Take 25 mg by mouth 2 (two) times daily with a meal.     CENTRUM SILVER ADULT 50+ Tabs  Take 1 tablet by mouth daily with breakfast.     clopidogrel 75 MG tablet  Commonly known as:  PLAVIX  Take 1 tablet (75 mg total) by mouth daily with breakfast.     famotidine 20 MG tablet  Commonly known as:  PEPCID  Take 20 mg by mouth 2 (two) times daily.     fenofibrate 160 MG tablet  Take 160 mg by mouth daily.     Fish Oil 1200 MG Caps  Take 4,800 mg by mouth daily.     glipiZIDE 5 MG 24 hr tablet  Commonly known as:  GLUCOTROL XL  Take 5 mg by mouth daily.     LIVALO 2 MG Tabs  Generic drug:  Pitavastatin Calcium  Take 2 mg by mouth at bedtime.     losartan 100 MG tablet  Commonly known as:  COZAAR  Take 100 mg by mouth daily.     nitroGLYCERIN 0.4 MG SL tablet  Commonly known as:  NITROSTAT  Place 0.4 mg  under the tongue every 5 (five) minutes as needed for chest pain.     traMADol 50 MG tablet  Commonly known as:  ULTRAM  Take 50 mg by mouth every 6 (six) hours as needed for moderate pain.     Vitamin D3 2000 UNITS Tabs  Take 2,000 Units by mouth daily with breakfast.         Duration of Discharge Encounter   Greater than 30 minutes including physician time.  Signed, Almyra Deforest PA-C 12/07/2013, 9:04 AM   I have examined the patient and reviewed assessment and plan and discussed with patient.  Agree with above as stated.  Hematoma ; 2+ right PT pulse.  Suggest sleep study.  F/u with his primary cardiologist.  Jettie Booze.

## 2013-12-07 NOTE — Discharge Instructions (Signed)
Angina Pectoris Angina pectoris is extreme discomfort in your chest, neck, or arm. Your doctor may call it just angina. It is caused by a lack of oxygen to your heart wall. It may feel like tightness or heavy pressure. It may feel like a crushing or squeezing pain. Some people say it feels like gas. It may go down your shoulders, back, and arms. Some people have symptoms other than pain. These include:  Tiredness.  Shortness of breath.  Cold sweats.  Feeling sick to your stomach (nausea). There are four types of angina:  Stable angina. This type often lasts the same amount of time each time it happens. Activity, stress, or excitement can bring it on. It often gets better after taking a medicine called nitroglycerin. This goes under your tongue.  Unstable angina. This type can happen when you are not active or even during sleep. It can suddenly get worse or happen more often. It may not get better after taking the special medicine. It can last up to 30 minutes.  Microvascular angina. This type is more common in women. It may be more severe or last longer than other types.  Prinzmetal angina. This type often happens when you are not active or in the early morning hours. HOME CARE   Only take medicines as told by your doctor.  Stay active or exercise more as told by your doctor.  Limit very hard activity as told by your doctor.  Limit heavy lifting as told by your doctor.  Keep a healthy weight.  Learn about and eat foods that are healthy for your heart.  Do not use any tobacco such as cigarettes, chewing tobacco, or e-cigarettes. GET HELP RIGHT AWAY IF:   You have chest, neck, deep shoulder, or arm pain or discomfort that lasts more than a few minutes.  You have chest, neck, deep shoulder, or arm pain or discomfort that goes away and comes back over and over again.  You have heavy sweating that seems to happen for no reason.  You have shortness of breath or trouble  breathing.  Your angina does not get better after a few minutes of rest.  Your angina does not get better after you take nitroglycerin medicine. These can all be symptoms of a heart attack. Get help right away. Call your local emergency service (911 in [place] Do not  drive yourself to the hospital. Do not  wait to for your symptoms to go away. MAKE SURE YOU:   Understand these instructions.  Will watch your condition.  Will get help right away if you are not doing well or get worse. Document Released: 10/27/2007 Document Revised: 05/15/2013 Document Reviewed: 02/17/2012 [facility] Patient Information 2015 [place], [place]. This information is not intended to replace advice given to you by your health care provider. Make sure you discuss any questions you have with your health care provider.  No driving for [id number] hours. No lifting over 5 lbs for 1 week. No sexual activity for 1 week. Keep procedure site clean & dry. If you notice increased pain, swelling, bleeding or pus, call/return!  You may shower, but no soaking baths/hot tubs/pools for 1 week. If right groin hematoma increase in size, right leg pain, pulsatile mass under hematoma or have dizziness, please call [place] or your primary cardiologist.

## 2013-12-07 NOTE — Progress Notes (Signed)
AM groin check: right groin firm hematoma extending medial to stick site. Moderate blue/purple bruising. Rt DP palpable. Instructed patient to stay in bed and not to sit up. No outward bleeding.

## 2013-12-07 NOTE — Progress Notes (Signed)
Patient Name: Matthew [name] Date of Encounter: 12/07/2013     Principal Problem:   Unstable angina; Class III Angina Active Problems:   Diabetes mellitus type 2, uncomplicated   HLD (hyperlipidemia)   HTN (hypertension)   CAD (coronary artery disease) of bypass graft    SUBJECTIVE  Denies any chest pain or SOB.   CURRENT MEDS . aspirin  81 mg Oral Daily  . atorvastatin  10 mg Oral q1800  . carvedilol  25 mg Oral BID WC  . clopidogrel  75 mg Oral Q breakfast  . famotidine  20 mg Oral Daily  . fenofibrate  160 mg Oral Daily  . omega-3 acid ethyl esters  2 g Oral BID  . sodium chloride  3 mL Intravenous Q12H    OBJECTIVE  Filed Vitals:   12/06/13 1400 12/06/13 1520 12/07/13 0012 12/07/13 0405  BP: 96/38 133/63 147/94 152/83  Pulse: 81 76 83 80  Temp:  98 F (36.7 C) 98.7 F (37.1 C) 98.7 F (37.1 C)  TempSrc:  Oral Oral Oral  Resp:  18 18 17   Height:      Weight:   235 lb 10.8 oz (106.9 kg)   SpO2: 94% 93% 96% 94%    Intake/Output Summary (Last 24 hours) at 12/07/13 [id number] Last data filed at 12/07/13 0522  Gross per 24 hour  Intake 1987.68 ml  Output   2400 ml  Net -412.32 ml   Filed Weights   12/04/13 0400 12/05/13 0409 12/07/13 0012  Weight: 232 lb 6.4 oz (105.416 kg) 232 lb 12.8 oz (105.597 kg) 235 lb 10.8 oz (106.9 kg)    PHYSICAL EXAM  General: Pleasant, NAD. Neuro: Alert and oriented X 3. Moves all extremities spontaneously. Psych: Normal affect. HEENT:  Normal  Neck: Supple without bruits or JVD. Lungs:  Resp regular and unlabored, CTA. Heart: RRR no s3, s4, or murmurs. Abdomen: Soft, non-tender, non-distended, BS + x 4. Large hematoma in R groin, nonpulsatile, no bruit on auscultation Extremities: No clubbing, cyanosis or edema. DP/PT/Radials 2+ and equal bilaterally.  Accessory Clinical Findings  CBC  Recent Labs  12/06/13 0616 12/07/13 0422  WBC 8.3 8.7  HGB 11.6* 11.5*  HCT 34.2* 34.5*  MCV 90.0 89.8  PLT 109* 116*   Basic  Metabolic Panel  Recent Labs  12/06/13 0616 12/07/13 0422  NA 139 136*  K 4.2 4.1  CL 102 100  CO2 23 20  GLUCOSE 141* 136*  BUN 22 24*  CREATININE 1.79* 1.80*  CALCIUM 9.0 8.9    TELE  NST with HR 80s, frequent PVCs, no sign of NSVT  ECG  Bigeminy with HR 80s  Radiology/Studies  Dg Chest Port 1 View  12/02/2013   CLINICAL DATA:  Chest pain radiating to arms.  EXAM: PORTABLE CHEST - 1 VIEW  COMPARISON:  None.  FINDINGS: The cardiac silhouette appears moderately enlarged, mediastinal silhouette is nonsuspicious, status post median sternotomy. No pleural effusions or focal consolidations. Probable granuloma left mid lung zone. No pneumothorax.  Multiple EKG lines overlie the patient and may obscure subtle underlying pathology. Partially imaged age indeterminate left humeral head fracture with degenerative change. Soft tissue planes are nonsuspicious.  IMPRESSION: Moderate cardiomegaly, no acute pulmonary process.  Age indeterminate partially imaged left humeral head fracture. Recommend correlation with point tenderness.   Electronically Signed   By: Elon Alas   On: 12/02/2013 23:50    ASSESSMENT AND PLAN  1. Unstable angina   - cath 7/14 severe native  CAD, ostial and prox 70-90% of SVG to rPDA, severely elevated LVEDP with HTN. Require staged therapy with CKD  - cath 7/16 95% ostial to mid SVG to PDA treated with DES  - plan for discharge today  2. CAD s/p 5v CABG > 15 yr ago   - Echo 12/03/13 EF 35-40%, severe hypokinesis of the inferolateral and inferior myocardium, peak PA pressure 52.   3. HTN  4. Hyperlipidemia  5. DM  6. CKD   - Cr 1.91   Signed, Almyra Deforest PA-C Pager: [phone]  I have examined the patient and reviewed assessment and plan and discussed with patient.  Agree with above as stated.  CRI-stable. right groin hematoma. 2+ right PT pulse. Plan d/c with DAPT.  Margurete Guaman S.

## 2013-12-10 DIAGNOSIS — I10 Essential (primary) hypertension: Secondary | ICD-10-CM | POA: Diagnosis not present

## 2013-12-10 DIAGNOSIS — I2589 Other forms of chronic ischemic heart disease: Secondary | ICD-10-CM | POA: Diagnosis not present

## 2013-12-10 DIAGNOSIS — E785 Hyperlipidemia, unspecified: Secondary | ICD-10-CM | POA: Diagnosis not present

## 2013-12-10 DIAGNOSIS — I2510 Atherosclerotic heart disease of native coronary artery without angina pectoris: Secondary | ICD-10-CM | POA: Diagnosis not present

## 2013-12-10 DIAGNOSIS — I25810 Atherosclerosis of coronary artery bypass graft(s) without angina pectoris: Secondary | ICD-10-CM | POA: Diagnosis not present

## 2013-12-17 ENCOUNTER — Encounter: Payer: Self-pay | Admitting: Cardiovascular Disease

## 2013-12-17 DIAGNOSIS — Z9861 Coronary angioplasty status: Secondary | ICD-10-CM | POA: Diagnosis not present

## 2013-12-17 DIAGNOSIS — Z5189 Encounter for other specified aftercare: Secondary | ICD-10-CM | POA: Diagnosis not present

## 2013-12-19 DIAGNOSIS — I10 Essential (primary) hypertension: Secondary | ICD-10-CM | POA: Diagnosis not present

## 2013-12-19 DIAGNOSIS — E119 Type 2 diabetes mellitus without complications: Secondary | ICD-10-CM | POA: Diagnosis not present

## 2013-12-21 DIAGNOSIS — N400 Enlarged prostate without lower urinary tract symptoms: Secondary | ICD-10-CM | POA: Diagnosis not present

## 2013-12-21 DIAGNOSIS — I10 Essential (primary) hypertension: Secondary | ICD-10-CM | POA: Diagnosis not present

## 2013-12-21 DIAGNOSIS — E1129 Type 2 diabetes mellitus with other diabetic kidney complication: Secondary | ICD-10-CM | POA: Diagnosis not present

## 2013-12-21 DIAGNOSIS — IMO0002 Reserved for concepts with insufficient information to code with codable children: Secondary | ICD-10-CM | POA: Diagnosis not present

## 2013-12-21 DIAGNOSIS — E785 Hyperlipidemia, unspecified: Secondary | ICD-10-CM | POA: Diagnosis not present

## 2013-12-21 DIAGNOSIS — J329 Chronic sinusitis, unspecified: Secondary | ICD-10-CM | POA: Diagnosis not present

## 2013-12-21 DIAGNOSIS — E119 Type 2 diabetes mellitus without complications: Secondary | ICD-10-CM | POA: Diagnosis not present

## 2013-12-21 DIAGNOSIS — I2510 Atherosclerotic heart disease of native coronary artery without angina pectoris: Secondary | ICD-10-CM | POA: Diagnosis not present

## 2013-12-22 ENCOUNTER — Encounter: Payer: Self-pay | Admitting: Cardiovascular Disease

## 2013-12-22 DIAGNOSIS — Z5189 Encounter for other specified aftercare: Secondary | ICD-10-CM | POA: Diagnosis not present

## 2013-12-22 DIAGNOSIS — Z951 Presence of aortocoronary bypass graft: Secondary | ICD-10-CM | POA: Diagnosis not present

## 2014-01-04 ENCOUNTER — Ambulatory Visit: Payer: Self-pay | Admitting: Otolaryngology

## 2014-01-04 DIAGNOSIS — R05 Cough: Secondary | ICD-10-CM | POA: Diagnosis not present

## 2014-01-04 DIAGNOSIS — R059 Cough, unspecified: Secondary | ICD-10-CM | POA: Diagnosis not present

## 2014-01-04 DIAGNOSIS — K219 Gastro-esophageal reflux disease without esophagitis: Secondary | ICD-10-CM | POA: Diagnosis not present

## 2014-01-10 DIAGNOSIS — I2510 Atherosclerotic heart disease of native coronary artery without angina pectoris: Secondary | ICD-10-CM | POA: Diagnosis not present

## 2014-01-10 DIAGNOSIS — I4891 Unspecified atrial fibrillation: Secondary | ICD-10-CM | POA: Diagnosis not present

## 2014-01-10 DIAGNOSIS — I10 Essential (primary) hypertension: Secondary | ICD-10-CM | POA: Diagnosis not present

## 2014-01-10 DIAGNOSIS — R9431 Abnormal electrocardiogram [ECG] [EKG]: Secondary | ICD-10-CM | POA: Diagnosis not present

## 2014-01-10 DIAGNOSIS — E785 Hyperlipidemia, unspecified: Secondary | ICD-10-CM | POA: Diagnosis not present

## 2014-01-10 DIAGNOSIS — Z719 Counseling, unspecified: Secondary | ICD-10-CM | POA: Insufficient documentation

## 2014-01-10 DIAGNOSIS — R6889 Other general symptoms and signs: Secondary | ICD-10-CM | POA: Insufficient documentation

## 2014-01-10 DIAGNOSIS — E663 Overweight: Secondary | ICD-10-CM | POA: Diagnosis not present

## 2014-01-10 DIAGNOSIS — I25810 Atherosclerosis of coronary artery bypass graft(s) without angina pectoris: Secondary | ICD-10-CM | POA: Diagnosis not present

## 2014-01-10 DIAGNOSIS — I428 Other cardiomyopathies: Secondary | ICD-10-CM | POA: Diagnosis not present

## 2014-01-10 DIAGNOSIS — I482 Chronic atrial fibrillation, unspecified: Secondary | ICD-10-CM

## 2014-01-11 DIAGNOSIS — K219 Gastro-esophageal reflux disease without esophagitis: Secondary | ICD-10-CM | POA: Diagnosis not present

## 2014-01-11 DIAGNOSIS — R05 Cough: Secondary | ICD-10-CM | POA: Diagnosis not present

## 2014-01-11 DIAGNOSIS — R059 Cough, unspecified: Secondary | ICD-10-CM | POA: Diagnosis not present

## 2014-01-21 DIAGNOSIS — I2510 Atherosclerotic heart disease of native coronary artery without angina pectoris: Secondary | ICD-10-CM | POA: Diagnosis not present

## 2014-01-21 DIAGNOSIS — I25810 Atherosclerosis of coronary artery bypass graft(s) without angina pectoris: Secondary | ICD-10-CM | POA: Diagnosis not present

## 2014-01-21 DIAGNOSIS — I10 Essential (primary) hypertension: Secondary | ICD-10-CM | POA: Diagnosis not present

## 2014-01-21 DIAGNOSIS — E785 Hyperlipidemia, unspecified: Secondary | ICD-10-CM | POA: Diagnosis not present

## 2014-01-21 DIAGNOSIS — I4891 Unspecified atrial fibrillation: Secondary | ICD-10-CM | POA: Diagnosis not present

## 2014-01-22 ENCOUNTER — Ambulatory Visit: Payer: Self-pay | Admitting: Otolaryngology

## 2014-01-22 DIAGNOSIS — J984 Other disorders of lung: Secondary | ICD-10-CM | POA: Diagnosis not present

## 2014-01-22 DIAGNOSIS — R911 Solitary pulmonary nodule: Secondary | ICD-10-CM | POA: Diagnosis not present

## 2014-01-23 ENCOUNTER — Encounter: Payer: Self-pay | Admitting: Cardiovascular Disease

## 2014-01-23 DIAGNOSIS — Z5189 Encounter for other specified aftercare: Secondary | ICD-10-CM | POA: Diagnosis not present

## 2014-01-23 DIAGNOSIS — Z9861 Coronary angioplasty status: Secondary | ICD-10-CM | POA: Diagnosis not present

## 2014-02-01 DIAGNOSIS — R059 Cough, unspecified: Secondary | ICD-10-CM | POA: Diagnosis not present

## 2014-02-01 DIAGNOSIS — K219 Gastro-esophageal reflux disease without esophagitis: Secondary | ICD-10-CM | POA: Diagnosis not present

## 2014-02-01 DIAGNOSIS — R05 Cough: Secondary | ICD-10-CM | POA: Diagnosis not present

## 2014-02-05 DIAGNOSIS — N183 Chronic kidney disease, stage 3 unspecified: Secondary | ICD-10-CM | POA: Diagnosis not present

## 2014-02-05 DIAGNOSIS — I10 Essential (primary) hypertension: Secondary | ICD-10-CM | POA: Diagnosis not present

## 2014-02-05 DIAGNOSIS — E559 Vitamin D deficiency, unspecified: Secondary | ICD-10-CM | POA: Diagnosis not present

## 2014-02-05 DIAGNOSIS — R809 Proteinuria, unspecified: Secondary | ICD-10-CM | POA: Diagnosis not present

## 2014-02-27 DIAGNOSIS — E785 Hyperlipidemia, unspecified: Secondary | ICD-10-CM | POA: Diagnosis not present

## 2014-02-27 DIAGNOSIS — N183 Chronic kidney disease, stage 3 (moderate): Secondary | ICD-10-CM | POA: Diagnosis not present

## 2014-02-27 DIAGNOSIS — E559 Vitamin D deficiency, unspecified: Secondary | ICD-10-CM | POA: Diagnosis not present

## 2014-02-27 DIAGNOSIS — I10 Essential (primary) hypertension: Secondary | ICD-10-CM | POA: Diagnosis not present

## 2014-03-04 DIAGNOSIS — I25810 Atherosclerosis of coronary artery bypass graft(s) without angina pectoris: Secondary | ICD-10-CM | POA: Diagnosis not present

## 2014-03-04 DIAGNOSIS — E785 Hyperlipidemia, unspecified: Secondary | ICD-10-CM | POA: Diagnosis not present

## 2014-03-04 DIAGNOSIS — I2510 Atherosclerotic heart disease of native coronary artery without angina pectoris: Secondary | ICD-10-CM | POA: Diagnosis not present

## 2014-03-04 DIAGNOSIS — I4891 Unspecified atrial fibrillation: Secondary | ICD-10-CM | POA: Diagnosis not present

## 2014-03-04 DIAGNOSIS — I340 Nonrheumatic mitral (valve) insufficiency: Secondary | ICD-10-CM | POA: Diagnosis not present

## 2014-03-04 DIAGNOSIS — I10 Essential (primary) hypertension: Secondary | ICD-10-CM | POA: Diagnosis not present

## 2014-03-22 DIAGNOSIS — I10 Essential (primary) hypertension: Secondary | ICD-10-CM | POA: Diagnosis not present

## 2014-03-22 DIAGNOSIS — E139 Other specified diabetes mellitus without complications: Secondary | ICD-10-CM | POA: Diagnosis not present

## 2014-03-22 DIAGNOSIS — S83412S Sprain of medial collateral ligament of left knee, sequela: Secondary | ICD-10-CM | POA: Diagnosis not present

## 2014-03-22 DIAGNOSIS — E785 Hyperlipidemia, unspecified: Secondary | ICD-10-CM | POA: Diagnosis not present

## 2014-03-22 DIAGNOSIS — J329 Chronic sinusitis, unspecified: Secondary | ICD-10-CM | POA: Diagnosis not present

## 2014-03-22 DIAGNOSIS — E1122 Type 2 diabetes mellitus with diabetic chronic kidney disease: Secondary | ICD-10-CM | POA: Diagnosis not present

## 2014-03-22 DIAGNOSIS — N400 Enlarged prostate without lower urinary tract symptoms: Secondary | ICD-10-CM | POA: Diagnosis not present

## 2014-03-22 DIAGNOSIS — S83412A Sprain of medial collateral ligament of left knee, initial encounter: Secondary | ICD-10-CM | POA: Diagnosis not present

## 2014-03-22 DIAGNOSIS — I2510 Atherosclerotic heart disease of native coronary artery without angina pectoris: Secondary | ICD-10-CM | POA: Diagnosis not present

## 2014-03-22 DIAGNOSIS — Z23 Encounter for immunization: Secondary | ICD-10-CM | POA: Diagnosis not present

## 2014-05-02 ENCOUNTER — Encounter (HOSPITAL_COMMUNITY): Payer: Self-pay | Admitting: Cardiology

## 2014-05-28 DIAGNOSIS — N184 Chronic kidney disease, stage 4 (severe): Secondary | ICD-10-CM | POA: Diagnosis not present

## 2014-05-28 DIAGNOSIS — I10 Essential (primary) hypertension: Secondary | ICD-10-CM | POA: Diagnosis not present

## 2014-05-28 DIAGNOSIS — E559 Vitamin D deficiency, unspecified: Secondary | ICD-10-CM | POA: Diagnosis not present

## 2014-05-28 DIAGNOSIS — R809 Proteinuria, unspecified: Secondary | ICD-10-CM | POA: Diagnosis not present

## 2014-06-24 DIAGNOSIS — E1122 Type 2 diabetes mellitus with diabetic chronic kidney disease: Secondary | ICD-10-CM | POA: Diagnosis not present

## 2014-06-26 DIAGNOSIS — S83412S Sprain of medial collateral ligament of left knee, sequela: Secondary | ICD-10-CM | POA: Diagnosis not present

## 2014-06-26 DIAGNOSIS — E1122 Type 2 diabetes mellitus with diabetic chronic kidney disease: Secondary | ICD-10-CM | POA: Diagnosis not present

## 2014-06-26 DIAGNOSIS — I10 Essential (primary) hypertension: Secondary | ICD-10-CM | POA: Diagnosis not present

## 2014-06-26 DIAGNOSIS — I2510 Atherosclerotic heart disease of native coronary artery without angina pectoris: Secondary | ICD-10-CM | POA: Diagnosis not present

## 2014-06-26 DIAGNOSIS — E785 Hyperlipidemia, unspecified: Secondary | ICD-10-CM | POA: Diagnosis not present

## 2014-06-26 DIAGNOSIS — E119 Type 2 diabetes mellitus without complications: Secondary | ICD-10-CM | POA: Diagnosis not present

## 2014-06-26 DIAGNOSIS — J329 Chronic sinusitis, unspecified: Secondary | ICD-10-CM | POA: Diagnosis not present

## 2014-06-26 DIAGNOSIS — N400 Enlarged prostate without lower urinary tract symptoms: Secondary | ICD-10-CM | POA: Diagnosis not present

## 2014-07-25 DIAGNOSIS — I4891 Unspecified atrial fibrillation: Secondary | ICD-10-CM | POA: Diagnosis not present

## 2014-07-25 DIAGNOSIS — I10 Essential (primary) hypertension: Secondary | ICD-10-CM | POA: Diagnosis not present

## 2014-07-25 DIAGNOSIS — I2510 Atherosclerotic heart disease of native coronary artery without angina pectoris: Secondary | ICD-10-CM | POA: Diagnosis not present

## 2014-07-25 DIAGNOSIS — I351 Nonrheumatic aortic (valve) insufficiency: Secondary | ICD-10-CM | POA: Diagnosis not present

## 2014-07-25 DIAGNOSIS — I340 Nonrheumatic mitral (valve) insufficiency: Secondary | ICD-10-CM | POA: Diagnosis not present

## 2014-07-25 DIAGNOSIS — I25810 Atherosclerosis of coronary artery bypass graft(s) without angina pectoris: Secondary | ICD-10-CM | POA: Diagnosis not present

## 2014-07-25 DIAGNOSIS — E785 Hyperlipidemia, unspecified: Secondary | ICD-10-CM | POA: Diagnosis not present

## 2014-08-27 DIAGNOSIS — R809 Proteinuria, unspecified: Secondary | ICD-10-CM | POA: Diagnosis not present

## 2014-08-27 DIAGNOSIS — I10 Essential (primary) hypertension: Secondary | ICD-10-CM | POA: Diagnosis not present

## 2014-08-27 DIAGNOSIS — E559 Vitamin D deficiency, unspecified: Secondary | ICD-10-CM | POA: Diagnosis not present

## 2014-08-27 DIAGNOSIS — N184 Chronic kidney disease, stage 4 (severe): Secondary | ICD-10-CM | POA: Diagnosis not present

## 2014-09-23 DIAGNOSIS — N400 Enlarged prostate without lower urinary tract symptoms: Secondary | ICD-10-CM | POA: Diagnosis not present

## 2014-09-23 DIAGNOSIS — E785 Hyperlipidemia, unspecified: Secondary | ICD-10-CM | POA: Diagnosis not present

## 2014-09-23 DIAGNOSIS — E1122 Type 2 diabetes mellitus with diabetic chronic kidney disease: Secondary | ICD-10-CM | POA: Diagnosis not present

## 2014-09-24 DIAGNOSIS — S83412S Sprain of medial collateral ligament of left knee, sequela: Secondary | ICD-10-CM | POA: Diagnosis not present

## 2014-09-24 DIAGNOSIS — I10 Essential (primary) hypertension: Secondary | ICD-10-CM | POA: Diagnosis not present

## 2014-09-24 DIAGNOSIS — J329 Chronic sinusitis, unspecified: Secondary | ICD-10-CM | POA: Diagnosis not present

## 2014-09-24 DIAGNOSIS — Z0000 Encounter for general adult medical examination without abnormal findings: Secondary | ICD-10-CM | POA: Diagnosis not present

## 2014-09-24 DIAGNOSIS — I2510 Atherosclerotic heart disease of native coronary artery without angina pectoris: Secondary | ICD-10-CM | POA: Diagnosis not present

## 2014-09-24 DIAGNOSIS — E785 Hyperlipidemia, unspecified: Secondary | ICD-10-CM | POA: Diagnosis not present

## 2014-09-24 DIAGNOSIS — N400 Enlarged prostate without lower urinary tract symptoms: Secondary | ICD-10-CM | POA: Diagnosis not present

## 2014-09-24 DIAGNOSIS — E119 Type 2 diabetes mellitus without complications: Secondary | ICD-10-CM | POA: Diagnosis not present

## 2014-09-24 DIAGNOSIS — E1122 Type 2 diabetes mellitus with diabetic chronic kidney disease: Secondary | ICD-10-CM | POA: Diagnosis not present

## 2014-10-02 DIAGNOSIS — H2513 Age-related nuclear cataract, bilateral: Secondary | ICD-10-CM | POA: Diagnosis not present

## 2014-10-23 DIAGNOSIS — I340 Nonrheumatic mitral (valve) insufficiency: Secondary | ICD-10-CM | POA: Diagnosis not present

## 2014-10-23 DIAGNOSIS — I10 Essential (primary) hypertension: Secondary | ICD-10-CM | POA: Diagnosis not present

## 2014-10-23 DIAGNOSIS — E785 Hyperlipidemia, unspecified: Secondary | ICD-10-CM | POA: Diagnosis not present

## 2014-10-23 DIAGNOSIS — I351 Nonrheumatic aortic (valve) insufficiency: Secondary | ICD-10-CM | POA: Diagnosis not present

## 2014-10-23 DIAGNOSIS — I25810 Atherosclerosis of coronary artery bypass graft(s) without angina pectoris: Secondary | ICD-10-CM | POA: Diagnosis not present

## 2014-10-23 DIAGNOSIS — R197 Diarrhea, unspecified: Secondary | ICD-10-CM | POA: Diagnosis not present

## 2014-10-23 DIAGNOSIS — I4891 Unspecified atrial fibrillation: Secondary | ICD-10-CM | POA: Diagnosis not present

## 2014-10-23 DIAGNOSIS — I2510 Atherosclerotic heart disease of native coronary artery without angina pectoris: Secondary | ICD-10-CM | POA: Diagnosis not present

## 2014-10-24 DIAGNOSIS — R197 Diarrhea, unspecified: Secondary | ICD-10-CM | POA: Diagnosis not present

## 2014-12-05 DIAGNOSIS — N184 Chronic kidney disease, stage 4 (severe): Secondary | ICD-10-CM | POA: Diagnosis not present

## 2014-12-05 DIAGNOSIS — E559 Vitamin D deficiency, unspecified: Secondary | ICD-10-CM | POA: Diagnosis not present

## 2014-12-05 DIAGNOSIS — E1122 Type 2 diabetes mellitus with diabetic chronic kidney disease: Secondary | ICD-10-CM | POA: Diagnosis not present

## 2014-12-05 DIAGNOSIS — I10 Essential (primary) hypertension: Secondary | ICD-10-CM | POA: Diagnosis not present

## 2014-12-16 DIAGNOSIS — E785 Hyperlipidemia, unspecified: Secondary | ICD-10-CM | POA: Diagnosis not present

## 2014-12-16 DIAGNOSIS — E559 Vitamin D deficiency, unspecified: Secondary | ICD-10-CM | POA: Diagnosis not present

## 2014-12-16 DIAGNOSIS — I10 Essential (primary) hypertension: Secondary | ICD-10-CM | POA: Diagnosis not present

## 2014-12-16 DIAGNOSIS — N183 Chronic kidney disease, stage 3 (moderate): Secondary | ICD-10-CM | POA: Diagnosis not present

## 2014-12-23 ENCOUNTER — Telehealth: Payer: Self-pay

## 2014-12-23 NOTE — Telephone Encounter (Signed)
**Note De-Identified  Obfuscation** According to his d/c summary from [facility] hospital on 7/17 the pts primary cardiologist is Dr Lucianne Muss at [facility]. Please refer to him.  Thanks.

## 2014-12-24 NOTE — Telephone Encounter (Signed)
If he is now in our practice, he will follow with Dr. Ellyn Hack.  I ust did the discharge after cath.

## 2015-01-02 DIAGNOSIS — I10 Essential (primary) hypertension: Secondary | ICD-10-CM | POA: Diagnosis not present

## 2015-01-02 DIAGNOSIS — E785 Hyperlipidemia, unspecified: Secondary | ICD-10-CM | POA: Diagnosis not present

## 2015-01-02 DIAGNOSIS — E119 Type 2 diabetes mellitus without complications: Secondary | ICD-10-CM | POA: Diagnosis not present

## 2015-01-08 DIAGNOSIS — I25810 Atherosclerosis of coronary artery bypass graft(s) without angina pectoris: Secondary | ICD-10-CM | POA: Diagnosis not present

## 2015-01-08 DIAGNOSIS — I4891 Unspecified atrial fibrillation: Secondary | ICD-10-CM | POA: Diagnosis not present

## 2015-01-08 DIAGNOSIS — R197 Diarrhea, unspecified: Secondary | ICD-10-CM | POA: Diagnosis not present

## 2015-01-08 DIAGNOSIS — I2510 Atherosclerotic heart disease of native coronary artery without angina pectoris: Secondary | ICD-10-CM | POA: Diagnosis not present

## 2015-01-08 DIAGNOSIS — I351 Nonrheumatic aortic (valve) insufficiency: Secondary | ICD-10-CM | POA: Diagnosis not present

## 2015-01-08 DIAGNOSIS — E119 Type 2 diabetes mellitus without complications: Secondary | ICD-10-CM | POA: Diagnosis not present

## 2015-01-08 DIAGNOSIS — I10 Essential (primary) hypertension: Secondary | ICD-10-CM | POA: Diagnosis not present

## 2015-01-08 DIAGNOSIS — I340 Nonrheumatic mitral (valve) insufficiency: Secondary | ICD-10-CM | POA: Diagnosis not present

## 2015-01-08 DIAGNOSIS — E785 Hyperlipidemia, unspecified: Secondary | ICD-10-CM | POA: Diagnosis not present

## 2015-01-13 DIAGNOSIS — I25810 Atherosclerosis of coronary artery bypass graft(s) without angina pectoris: Secondary | ICD-10-CM | POA: Diagnosis not present

## 2015-01-13 DIAGNOSIS — I2510 Atherosclerotic heart disease of native coronary artery without angina pectoris: Secondary | ICD-10-CM | POA: Diagnosis not present

## 2015-01-13 DIAGNOSIS — I420 Dilated cardiomyopathy: Secondary | ICD-10-CM | POA: Diagnosis not present

## 2015-01-13 DIAGNOSIS — E785 Hyperlipidemia, unspecified: Secondary | ICD-10-CM | POA: Diagnosis not present

## 2015-01-13 DIAGNOSIS — I10 Essential (primary) hypertension: Secondary | ICD-10-CM | POA: Diagnosis not present

## 2015-02-03 DIAGNOSIS — R079 Chest pain, unspecified: Secondary | ICD-10-CM | POA: Diagnosis not present

## 2015-02-05 DIAGNOSIS — I420 Dilated cardiomyopathy: Secondary | ICD-10-CM | POA: Diagnosis not present

## 2015-02-05 DIAGNOSIS — I10 Essential (primary) hypertension: Secondary | ICD-10-CM | POA: Diagnosis not present

## 2015-02-05 DIAGNOSIS — I25810 Atherosclerosis of coronary artery bypass graft(s) without angina pectoris: Secondary | ICD-10-CM | POA: Diagnosis not present

## 2015-02-05 DIAGNOSIS — E785 Hyperlipidemia, unspecified: Secondary | ICD-10-CM | POA: Diagnosis not present

## 2015-02-05 DIAGNOSIS — I340 Nonrheumatic mitral (valve) insufficiency: Secondary | ICD-10-CM | POA: Diagnosis not present

## 2015-02-05 DIAGNOSIS — I2510 Atherosclerotic heart disease of native coronary artery without angina pectoris: Secondary | ICD-10-CM | POA: Diagnosis not present

## 2015-02-05 DIAGNOSIS — I4891 Unspecified atrial fibrillation: Secondary | ICD-10-CM | POA: Diagnosis not present

## 2015-02-21 DIAGNOSIS — R931 Abnormal findings on diagnostic imaging of heart and coronary circulation: Secondary | ICD-10-CM | POA: Diagnosis not present

## 2015-02-25 DIAGNOSIS — I10 Essential (primary) hypertension: Secondary | ICD-10-CM | POA: Diagnosis not present

## 2015-02-25 DIAGNOSIS — N184 Chronic kidney disease, stage 4 (severe): Secondary | ICD-10-CM | POA: Diagnosis not present

## 2015-02-25 DIAGNOSIS — E559 Vitamin D deficiency, unspecified: Secondary | ICD-10-CM | POA: Diagnosis not present

## 2015-02-25 DIAGNOSIS — E1122 Type 2 diabetes mellitus with diabetic chronic kidney disease: Secondary | ICD-10-CM | POA: Diagnosis not present

## 2015-04-28 DIAGNOSIS — L570 Actinic keratosis: Secondary | ICD-10-CM | POA: Diagnosis not present

## 2015-04-30 DIAGNOSIS — I25810 Atherosclerosis of coronary artery bypass graft(s) without angina pectoris: Secondary | ICD-10-CM | POA: Diagnosis not present

## 2015-04-30 DIAGNOSIS — Z23 Encounter for immunization: Secondary | ICD-10-CM | POA: Diagnosis not present

## 2015-04-30 DIAGNOSIS — R197 Diarrhea, unspecified: Secondary | ICD-10-CM | POA: Diagnosis not present

## 2015-04-30 DIAGNOSIS — I2510 Atherosclerotic heart disease of native coronary artery without angina pectoris: Secondary | ICD-10-CM | POA: Diagnosis not present

## 2015-04-30 DIAGNOSIS — J329 Chronic sinusitis, unspecified: Secondary | ICD-10-CM | POA: Diagnosis not present

## 2015-04-30 DIAGNOSIS — E119 Type 2 diabetes mellitus without complications: Secondary | ICD-10-CM | POA: Diagnosis not present

## 2015-04-30 DIAGNOSIS — I340 Nonrheumatic mitral (valve) insufficiency: Secondary | ICD-10-CM | POA: Diagnosis not present

## 2015-04-30 DIAGNOSIS — I4891 Unspecified atrial fibrillation: Secondary | ICD-10-CM | POA: Diagnosis not present

## 2015-04-30 DIAGNOSIS — I351 Nonrheumatic aortic (valve) insufficiency: Secondary | ICD-10-CM | POA: Diagnosis not present

## 2015-04-30 DIAGNOSIS — E785 Hyperlipidemia, unspecified: Secondary | ICD-10-CM | POA: Diagnosis not present

## 2015-04-30 DIAGNOSIS — I10 Essential (primary) hypertension: Secondary | ICD-10-CM | POA: Diagnosis not present

## 2015-05-27 DIAGNOSIS — E559 Vitamin D deficiency, unspecified: Secondary | ICD-10-CM | POA: Diagnosis not present

## 2015-05-27 DIAGNOSIS — N184 Chronic kidney disease, stage 4 (severe): Secondary | ICD-10-CM | POA: Diagnosis not present

## 2015-05-27 DIAGNOSIS — I10 Essential (primary) hypertension: Secondary | ICD-10-CM | POA: Diagnosis not present

## 2015-05-27 DIAGNOSIS — E1122 Type 2 diabetes mellitus with diabetic chronic kidney disease: Secondary | ICD-10-CM | POA: Diagnosis not present

## 2015-06-19 DIAGNOSIS — I25810 Atherosclerosis of coronary artery bypass graft(s) without angina pectoris: Secondary | ICD-10-CM | POA: Diagnosis not present

## 2015-06-19 DIAGNOSIS — I2510 Atherosclerotic heart disease of native coronary artery without angina pectoris: Secondary | ICD-10-CM | POA: Diagnosis not present

## 2015-06-19 DIAGNOSIS — I10 Essential (primary) hypertension: Secondary | ICD-10-CM | POA: Diagnosis not present

## 2015-06-19 DIAGNOSIS — R0602 Shortness of breath: Secondary | ICD-10-CM | POA: Diagnosis not present

## 2015-06-19 DIAGNOSIS — E785 Hyperlipidemia, unspecified: Secondary | ICD-10-CM | POA: Diagnosis not present

## 2015-06-25 DIAGNOSIS — R05 Cough: Secondary | ICD-10-CM | POA: Diagnosis not present

## 2015-06-25 DIAGNOSIS — K219 Gastro-esophageal reflux disease without esophagitis: Secondary | ICD-10-CM | POA: Diagnosis not present

## 2015-06-25 DIAGNOSIS — R1312 Dysphagia, oropharyngeal phase: Secondary | ICD-10-CM | POA: Diagnosis not present

## 2015-07-03 DIAGNOSIS — R05 Cough: Secondary | ICD-10-CM | POA: Diagnosis not present

## 2015-07-03 DIAGNOSIS — J0100 Acute maxillary sinusitis, unspecified: Secondary | ICD-10-CM | POA: Diagnosis not present

## 2015-07-14 DIAGNOSIS — I719 Aortic aneurysm of unspecified site, without rupture: Secondary | ICD-10-CM | POA: Diagnosis not present

## 2015-07-14 DIAGNOSIS — R0602 Shortness of breath: Secondary | ICD-10-CM | POA: Diagnosis not present

## 2015-07-14 DIAGNOSIS — Z951 Presence of aortocoronary bypass graft: Secondary | ICD-10-CM | POA: Diagnosis not present

## 2015-07-14 DIAGNOSIS — R609 Edema, unspecified: Secondary | ICD-10-CM | POA: Diagnosis not present

## 2015-07-14 DIAGNOSIS — I2510 Atherosclerotic heart disease of native coronary artery without angina pectoris: Secondary | ICD-10-CM | POA: Diagnosis not present

## 2015-07-17 DIAGNOSIS — K219 Gastro-esophageal reflux disease without esophagitis: Secondary | ICD-10-CM | POA: Diagnosis present

## 2015-07-17 DIAGNOSIS — I361 Nonrheumatic tricuspid (valve) insufficiency: Secondary | ICD-10-CM | POA: Diagnosis not present

## 2015-07-17 DIAGNOSIS — R600 Localized edema: Secondary | ICD-10-CM | POA: Diagnosis not present

## 2015-07-17 DIAGNOSIS — I25810 Atherosclerosis of coronary artery bypass graft(s) without angina pectoris: Secondary | ICD-10-CM | POA: Diagnosis not present

## 2015-07-17 DIAGNOSIS — E785 Hyperlipidemia, unspecified: Secondary | ICD-10-CM | POA: Diagnosis present

## 2015-07-17 DIAGNOSIS — I7101 Dissection of thoracic aorta: Secondary | ICD-10-CM | POA: Diagnosis not present

## 2015-07-17 DIAGNOSIS — E782 Mixed hyperlipidemia: Secondary | ICD-10-CM | POA: Diagnosis not present

## 2015-07-17 DIAGNOSIS — I371 Nonrheumatic pulmonary valve insufficiency: Secondary | ICD-10-CM | POA: Diagnosis not present

## 2015-07-17 DIAGNOSIS — Z955 Presence of coronary angioplasty implant and graft: Secondary | ICD-10-CM | POA: Diagnosis not present

## 2015-07-17 DIAGNOSIS — J811 Chronic pulmonary edema: Secondary | ICD-10-CM | POA: Diagnosis not present

## 2015-07-17 DIAGNOSIS — I129 Hypertensive chronic kidney disease with stage 1 through stage 4 chronic kidney disease, or unspecified chronic kidney disease: Secondary | ICD-10-CM | POA: Diagnosis present

## 2015-07-17 DIAGNOSIS — I77810 Thoracic aortic ectasia: Secondary | ICD-10-CM | POA: Diagnosis not present

## 2015-07-17 DIAGNOSIS — I2510 Atherosclerotic heart disease of native coronary artery without angina pectoris: Secondary | ICD-10-CM | POA: Diagnosis present

## 2015-07-17 DIAGNOSIS — R0600 Dyspnea, unspecified: Secondary | ICD-10-CM | POA: Diagnosis not present

## 2015-07-17 DIAGNOSIS — I10 Essential (primary) hypertension: Secondary | ICD-10-CM | POA: Diagnosis not present

## 2015-07-17 DIAGNOSIS — I351 Nonrheumatic aortic (valve) insufficiency: Secondary | ICD-10-CM | POA: Diagnosis not present

## 2015-07-17 DIAGNOSIS — I340 Nonrheumatic mitral (valve) insufficiency: Secondary | ICD-10-CM | POA: Diagnosis present

## 2015-07-17 DIAGNOSIS — Z87891 Personal history of nicotine dependence: Secondary | ICD-10-CM | POA: Diagnosis not present

## 2015-07-17 DIAGNOSIS — I779 Disorder of arteries and arterioles, unspecified: Secondary | ICD-10-CM | POA: Diagnosis not present

## 2015-07-17 DIAGNOSIS — N184 Chronic kidney disease, stage 4 (severe): Secondary | ICD-10-CM | POA: Diagnosis present

## 2015-07-17 DIAGNOSIS — E1122 Type 2 diabetes mellitus with diabetic chronic kidney disease: Secondary | ICD-10-CM | POA: Diagnosis present

## 2015-07-17 DIAGNOSIS — Z79899 Other long term (current) drug therapy: Secondary | ICD-10-CM | POA: Diagnosis not present

## 2015-07-17 DIAGNOSIS — Z951 Presence of aortocoronary bypass graft: Secondary | ICD-10-CM | POA: Diagnosis not present

## 2015-07-17 DIAGNOSIS — I719 Aortic aneurysm of unspecified site, without rupture: Secondary | ICD-10-CM | POA: Diagnosis not present

## 2015-07-17 DIAGNOSIS — I517 Cardiomegaly: Secondary | ICD-10-CM | POA: Diagnosis not present

## 2015-07-17 DIAGNOSIS — N189 Chronic kidney disease, unspecified: Secondary | ICD-10-CM | POA: Diagnosis not present

## 2015-07-17 DIAGNOSIS — M1990 Unspecified osteoarthritis, unspecified site: Secondary | ICD-10-CM | POA: Diagnosis present

## 2015-07-18 DIAGNOSIS — K219 Gastro-esophageal reflux disease without esophagitis: Secondary | ICD-10-CM | POA: Insufficient documentation

## 2015-07-18 DIAGNOSIS — I77810 Thoracic aortic ectasia: Secondary | ICD-10-CM | POA: Insufficient documentation

## 2015-07-18 DIAGNOSIS — R600 Localized edema: Secondary | ICD-10-CM | POA: Insufficient documentation

## 2015-07-18 DIAGNOSIS — I7121 Aneurysm of the ascending aorta, without rupture: Secondary | ICD-10-CM

## 2015-07-18 DIAGNOSIS — I712 Thoracic aortic aneurysm, without rupture, unspecified: Secondary | ICD-10-CM | POA: Insufficient documentation

## 2015-07-18 DIAGNOSIS — R0602 Shortness of breath: Secondary | ICD-10-CM | POA: Insufficient documentation

## 2015-07-18 DIAGNOSIS — N189 Chronic kidney disease, unspecified: Secondary | ICD-10-CM | POA: Insufficient documentation

## 2015-07-18 HISTORY — DX: Aneurysm of the ascending aorta, without rupture: I71.21

## 2015-07-22 DIAGNOSIS — Z01810 Encounter for preprocedural cardiovascular examination: Secondary | ICD-10-CM | POA: Diagnosis not present

## 2015-08-04 DIAGNOSIS — Z951 Presence of aortocoronary bypass graft: Secondary | ICD-10-CM | POA: Insufficient documentation

## 2015-08-04 DIAGNOSIS — R05 Cough: Secondary | ICD-10-CM | POA: Diagnosis not present

## 2015-08-04 DIAGNOSIS — I509 Heart failure, unspecified: Secondary | ICD-10-CM | POA: Insufficient documentation

## 2015-08-04 DIAGNOSIS — I719 Aortic aneurysm of unspecified site, without rupture: Secondary | ICD-10-CM | POA: Diagnosis not present

## 2015-08-04 DIAGNOSIS — I351 Nonrheumatic aortic (valve) insufficiency: Secondary | ICD-10-CM | POA: Diagnosis not present

## 2015-08-04 DIAGNOSIS — I10 Essential (primary) hypertension: Secondary | ICD-10-CM | POA: Diagnosis not present

## 2015-08-04 DIAGNOSIS — I712 Thoracic aortic aneurysm, without rupture, unspecified: Secondary | ICD-10-CM | POA: Insufficient documentation

## 2015-08-04 DIAGNOSIS — E785 Hyperlipidemia, unspecified: Secondary | ICD-10-CM | POA: Diagnosis not present

## 2015-08-04 DIAGNOSIS — I25810 Atherosclerosis of coronary artery bypass graft(s) without angina pectoris: Secondary | ICD-10-CM | POA: Diagnosis not present

## 2015-08-04 DIAGNOSIS — I2510 Atherosclerotic heart disease of native coronary artery without angina pectoris: Secondary | ICD-10-CM | POA: Diagnosis not present

## 2015-08-19 DIAGNOSIS — E1122 Type 2 diabetes mellitus with diabetic chronic kidney disease: Secondary | ICD-10-CM | POA: Diagnosis not present

## 2015-08-19 DIAGNOSIS — N184 Chronic kidney disease, stage 4 (severe): Secondary | ICD-10-CM | POA: Diagnosis not present

## 2015-08-19 DIAGNOSIS — I10 Essential (primary) hypertension: Secondary | ICD-10-CM | POA: Diagnosis not present

## 2015-08-19 DIAGNOSIS — E1129 Type 2 diabetes mellitus with other diabetic kidney complication: Secondary | ICD-10-CM | POA: Diagnosis not present

## 2015-08-19 DIAGNOSIS — E559 Vitamin D deficiency, unspecified: Secondary | ICD-10-CM | POA: Diagnosis not present

## 2015-09-04 DIAGNOSIS — I10 Essential (primary) hypertension: Secondary | ICD-10-CM | POA: Diagnosis not present

## 2015-09-04 DIAGNOSIS — N184 Chronic kidney disease, stage 4 (severe): Secondary | ICD-10-CM | POA: Diagnosis not present

## 2015-09-04 DIAGNOSIS — E785 Hyperlipidemia, unspecified: Secondary | ICD-10-CM | POA: Diagnosis not present

## 2015-09-04 DIAGNOSIS — Z951 Presence of aortocoronary bypass graft: Secondary | ICD-10-CM | POA: Diagnosis not present

## 2015-09-04 DIAGNOSIS — R011 Cardiac murmur, unspecified: Secondary | ICD-10-CM | POA: Insufficient documentation

## 2015-09-04 DIAGNOSIS — I2510 Atherosclerotic heart disease of native coronary artery without angina pectoris: Secondary | ICD-10-CM | POA: Diagnosis not present

## 2015-09-04 DIAGNOSIS — I719 Aortic aneurysm of unspecified site, without rupture: Secondary | ICD-10-CM | POA: Diagnosis not present

## 2015-10-17 DIAGNOSIS — I719 Aortic aneurysm of unspecified site, without rupture: Secondary | ICD-10-CM | POA: Diagnosis not present

## 2015-10-17 DIAGNOSIS — W01118A Fall on same level from slipping, tripping and stumbling with subsequent striking against other sharp object, initial encounter: Secondary | ICD-10-CM | POA: Diagnosis not present

## 2015-10-17 DIAGNOSIS — E785 Hyperlipidemia, unspecified: Secondary | ICD-10-CM | POA: Diagnosis not present

## 2015-10-17 DIAGNOSIS — I2510 Atherosclerotic heart disease of native coronary artery without angina pectoris: Secondary | ICD-10-CM | POA: Diagnosis not present

## 2015-10-17 DIAGNOSIS — I10 Essential (primary) hypertension: Secondary | ICD-10-CM | POA: Diagnosis not present

## 2015-10-21 DIAGNOSIS — N429 Disorder of prostate, unspecified: Secondary | ICD-10-CM | POA: Diagnosis not present

## 2015-10-21 DIAGNOSIS — E119 Type 2 diabetes mellitus without complications: Secondary | ICD-10-CM | POA: Diagnosis not present

## 2015-10-21 DIAGNOSIS — I10 Essential (primary) hypertension: Secondary | ICD-10-CM | POA: Diagnosis not present

## 2015-10-28 DIAGNOSIS — I351 Nonrheumatic aortic (valve) insufficiency: Secondary | ICD-10-CM | POA: Diagnosis not present

## 2015-10-28 DIAGNOSIS — E785 Hyperlipidemia, unspecified: Secondary | ICD-10-CM | POA: Diagnosis not present

## 2015-10-28 DIAGNOSIS — I712 Thoracic aortic aneurysm, without rupture: Secondary | ICD-10-CM | POA: Diagnosis not present

## 2015-10-28 DIAGNOSIS — I4891 Unspecified atrial fibrillation: Secondary | ICD-10-CM | POA: Diagnosis not present

## 2015-10-28 DIAGNOSIS — I2510 Atherosclerotic heart disease of native coronary artery without angina pectoris: Secondary | ICD-10-CM | POA: Diagnosis not present

## 2015-10-28 DIAGNOSIS — I340 Nonrheumatic mitral (valve) insufficiency: Secondary | ICD-10-CM | POA: Diagnosis not present

## 2015-10-28 DIAGNOSIS — R197 Diarrhea, unspecified: Secondary | ICD-10-CM | POA: Diagnosis not present

## 2015-10-28 DIAGNOSIS — I10 Essential (primary) hypertension: Secondary | ICD-10-CM | POA: Diagnosis not present

## 2015-10-28 DIAGNOSIS — I25810 Atherosclerosis of coronary artery bypass graft(s) without angina pectoris: Secondary | ICD-10-CM | POA: Diagnosis not present

## 2015-11-20 DIAGNOSIS — N184 Chronic kidney disease, stage 4 (severe): Secondary | ICD-10-CM | POA: Diagnosis not present

## 2015-11-20 DIAGNOSIS — E1122 Type 2 diabetes mellitus with diabetic chronic kidney disease: Secondary | ICD-10-CM | POA: Diagnosis not present

## 2015-11-20 DIAGNOSIS — I10 Essential (primary) hypertension: Secondary | ICD-10-CM | POA: Diagnosis not present

## 2015-11-20 DIAGNOSIS — E559 Vitamin D deficiency, unspecified: Secondary | ICD-10-CM | POA: Diagnosis not present

## 2015-11-20 DIAGNOSIS — D631 Anemia in chronic kidney disease: Secondary | ICD-10-CM | POA: Diagnosis not present

## 2015-12-18 DIAGNOSIS — I509 Heart failure, unspecified: Secondary | ICD-10-CM | POA: Diagnosis not present

## 2015-12-18 DIAGNOSIS — I340 Nonrheumatic mitral (valve) insufficiency: Secondary | ICD-10-CM | POA: Diagnosis not present

## 2015-12-18 DIAGNOSIS — I2510 Atherosclerotic heart disease of native coronary artery without angina pectoris: Secondary | ICD-10-CM | POA: Diagnosis not present

## 2015-12-18 DIAGNOSIS — I10 Essential (primary) hypertension: Secondary | ICD-10-CM | POA: Diagnosis not present

## 2015-12-18 DIAGNOSIS — I719 Aortic aneurysm of unspecified site, without rupture: Secondary | ICD-10-CM | POA: Diagnosis not present

## 2015-12-18 DIAGNOSIS — E785 Hyperlipidemia, unspecified: Secondary | ICD-10-CM | POA: Diagnosis not present

## 2015-12-18 DIAGNOSIS — I351 Nonrheumatic aortic (valve) insufficiency: Secondary | ICD-10-CM | POA: Diagnosis not present

## 2015-12-31 DIAGNOSIS — K219 Gastro-esophageal reflux disease without esophagitis: Secondary | ICD-10-CM | POA: Diagnosis not present

## 2016-01-28 DIAGNOSIS — S61211A Laceration without foreign body of left index finger without damage to nail, initial encounter: Secondary | ICD-10-CM | POA: Diagnosis not present

## 2016-02-02 DIAGNOSIS — S61211D Laceration without foreign body of left index finger without damage to nail, subsequent encounter: Secondary | ICD-10-CM | POA: Diagnosis not present

## 2016-02-03 DIAGNOSIS — E119 Type 2 diabetes mellitus without complications: Secondary | ICD-10-CM | POA: Diagnosis not present

## 2016-02-03 DIAGNOSIS — E785 Hyperlipidemia, unspecified: Secondary | ICD-10-CM | POA: Diagnosis not present

## 2016-02-06 DIAGNOSIS — E1122 Type 2 diabetes mellitus with diabetic chronic kidney disease: Secondary | ICD-10-CM | POA: Diagnosis not present

## 2016-02-06 DIAGNOSIS — I340 Nonrheumatic mitral (valve) insufficiency: Secondary | ICD-10-CM | POA: Diagnosis not present

## 2016-02-06 DIAGNOSIS — I2510 Atherosclerotic heart disease of native coronary artery without angina pectoris: Secondary | ICD-10-CM | POA: Diagnosis not present

## 2016-02-06 DIAGNOSIS — R197 Diarrhea, unspecified: Secondary | ICD-10-CM | POA: Diagnosis not present

## 2016-02-06 DIAGNOSIS — I25810 Atherosclerosis of coronary artery bypass graft(s) without angina pectoris: Secondary | ICD-10-CM | POA: Diagnosis not present

## 2016-02-06 DIAGNOSIS — I4891 Unspecified atrial fibrillation: Secondary | ICD-10-CM | POA: Diagnosis not present

## 2016-02-06 DIAGNOSIS — E785 Hyperlipidemia, unspecified: Secondary | ICD-10-CM | POA: Diagnosis not present

## 2016-02-06 DIAGNOSIS — I712 Thoracic aortic aneurysm, without rupture: Secondary | ICD-10-CM | POA: Diagnosis not present

## 2016-02-06 DIAGNOSIS — I351 Nonrheumatic aortic (valve) insufficiency: Secondary | ICD-10-CM | POA: Diagnosis not present

## 2016-02-06 DIAGNOSIS — I10 Essential (primary) hypertension: Secondary | ICD-10-CM | POA: Diagnosis not present

## 2016-02-06 DIAGNOSIS — Z4802 Encounter for removal of sutures: Secondary | ICD-10-CM | POA: Diagnosis not present

## 2016-02-06 DIAGNOSIS — S61211D Laceration without foreign body of left index finger without damage to nail, subsequent encounter: Secondary | ICD-10-CM | POA: Diagnosis not present

## 2016-02-17 DIAGNOSIS — I340 Nonrheumatic mitral (valve) insufficiency: Secondary | ICD-10-CM | POA: Diagnosis not present

## 2016-02-17 DIAGNOSIS — I10 Essential (primary) hypertension: Secondary | ICD-10-CM | POA: Diagnosis not present

## 2016-02-17 DIAGNOSIS — E785 Hyperlipidemia, unspecified: Secondary | ICD-10-CM | POA: Diagnosis not present

## 2016-02-17 DIAGNOSIS — I351 Nonrheumatic aortic (valve) insufficiency: Secondary | ICD-10-CM | POA: Diagnosis not present

## 2016-02-17 DIAGNOSIS — I2510 Atherosclerotic heart disease of native coronary artery without angina pectoris: Secondary | ICD-10-CM | POA: Diagnosis not present

## 2016-02-17 DIAGNOSIS — I509 Heart failure, unspecified: Secondary | ICD-10-CM | POA: Diagnosis not present

## 2016-02-24 DIAGNOSIS — N184 Chronic kidney disease, stage 4 (severe): Secondary | ICD-10-CM | POA: Diagnosis not present

## 2016-02-24 DIAGNOSIS — D631 Anemia in chronic kidney disease: Secondary | ICD-10-CM | POA: Diagnosis not present

## 2016-02-24 DIAGNOSIS — I10 Essential (primary) hypertension: Secondary | ICD-10-CM | POA: Diagnosis not present

## 2016-02-24 DIAGNOSIS — E1122 Type 2 diabetes mellitus with diabetic chronic kidney disease: Secondary | ICD-10-CM | POA: Diagnosis not present

## 2016-02-24 DIAGNOSIS — N2581 Secondary hyperparathyroidism of renal origin: Secondary | ICD-10-CM | POA: Diagnosis not present

## 2016-03-24 DIAGNOSIS — L570 Actinic keratosis: Secondary | ICD-10-CM | POA: Diagnosis not present

## 2016-03-24 DIAGNOSIS — L821 Other seborrheic keratosis: Secondary | ICD-10-CM | POA: Diagnosis not present

## 2016-03-24 DIAGNOSIS — L814 Other melanin hyperpigmentation: Secondary | ICD-10-CM | POA: Diagnosis not present

## 2016-04-03 DIAGNOSIS — Z23 Encounter for immunization: Secondary | ICD-10-CM | POA: Diagnosis not present

## 2016-04-26 DIAGNOSIS — H2513 Age-related nuclear cataract, bilateral: Secondary | ICD-10-CM | POA: Diagnosis not present

## 2016-04-27 DIAGNOSIS — Z01810 Encounter for preprocedural cardiovascular examination: Secondary | ICD-10-CM | POA: Diagnosis not present

## 2016-04-27 DIAGNOSIS — R55 Syncope and collapse: Secondary | ICD-10-CM | POA: Diagnosis not present

## 2016-05-10 DIAGNOSIS — E785 Hyperlipidemia, unspecified: Secondary | ICD-10-CM | POA: Diagnosis not present

## 2016-05-10 DIAGNOSIS — I10 Essential (primary) hypertension: Secondary | ICD-10-CM | POA: Diagnosis not present

## 2016-05-12 DIAGNOSIS — I10 Essential (primary) hypertension: Secondary | ICD-10-CM | POA: Diagnosis not present

## 2016-05-12 DIAGNOSIS — I351 Nonrheumatic aortic (valve) insufficiency: Secondary | ICD-10-CM | POA: Diagnosis not present

## 2016-05-12 DIAGNOSIS — I4891 Unspecified atrial fibrillation: Secondary | ICD-10-CM | POA: Diagnosis not present

## 2016-05-12 DIAGNOSIS — I340 Nonrheumatic mitral (valve) insufficiency: Secondary | ICD-10-CM | POA: Diagnosis not present

## 2016-05-12 DIAGNOSIS — I2510 Atherosclerotic heart disease of native coronary artery without angina pectoris: Secondary | ICD-10-CM | POA: Diagnosis not present

## 2016-05-12 DIAGNOSIS — E785 Hyperlipidemia, unspecified: Secondary | ICD-10-CM | POA: Diagnosis not present

## 2016-05-12 DIAGNOSIS — I712 Thoracic aortic aneurysm, without rupture: Secondary | ICD-10-CM | POA: Diagnosis not present

## 2016-05-12 DIAGNOSIS — R197 Diarrhea, unspecified: Secondary | ICD-10-CM | POA: Diagnosis not present

## 2016-05-12 DIAGNOSIS — E1122 Type 2 diabetes mellitus with diabetic chronic kidney disease: Secondary | ICD-10-CM | POA: Diagnosis not present

## 2016-05-12 DIAGNOSIS — I25810 Atherosclerosis of coronary artery bypass graft(s) without angina pectoris: Secondary | ICD-10-CM | POA: Diagnosis not present

## 2016-05-12 DIAGNOSIS — J329 Chronic sinusitis, unspecified: Secondary | ICD-10-CM | POA: Diagnosis not present

## 2016-05-18 DIAGNOSIS — J309 Allergic rhinitis, unspecified: Secondary | ICD-10-CM | POA: Diagnosis not present

## 2016-05-18 DIAGNOSIS — J209 Acute bronchitis, unspecified: Secondary | ICD-10-CM | POA: Diagnosis not present

## 2016-05-20 DIAGNOSIS — N2581 Secondary hyperparathyroidism of renal origin: Secondary | ICD-10-CM | POA: Diagnosis not present

## 2016-05-20 DIAGNOSIS — E1122 Type 2 diabetes mellitus with diabetic chronic kidney disease: Secondary | ICD-10-CM | POA: Diagnosis not present

## 2016-05-20 DIAGNOSIS — I10 Essential (primary) hypertension: Secondary | ICD-10-CM | POA: Diagnosis not present

## 2016-05-20 DIAGNOSIS — D631 Anemia in chronic kidney disease: Secondary | ICD-10-CM | POA: Diagnosis not present

## 2016-05-20 DIAGNOSIS — N184 Chronic kidney disease, stage 4 (severe): Secondary | ICD-10-CM | POA: Diagnosis not present

## 2016-06-18 DIAGNOSIS — I10 Essential (primary) hypertension: Secondary | ICD-10-CM | POA: Diagnosis not present

## 2016-06-18 DIAGNOSIS — I4891 Unspecified atrial fibrillation: Secondary | ICD-10-CM | POA: Diagnosis not present

## 2016-06-18 DIAGNOSIS — M545 Low back pain: Secondary | ICD-10-CM | POA: Diagnosis not present

## 2016-06-18 DIAGNOSIS — E785 Hyperlipidemia, unspecified: Secondary | ICD-10-CM | POA: Diagnosis not present

## 2016-06-21 DIAGNOSIS — I4891 Unspecified atrial fibrillation: Secondary | ICD-10-CM | POA: Diagnosis not present

## 2016-06-21 DIAGNOSIS — I340 Nonrheumatic mitral (valve) insufficiency: Secondary | ICD-10-CM | POA: Diagnosis not present

## 2016-06-21 DIAGNOSIS — E1122 Type 2 diabetes mellitus with diabetic chronic kidney disease: Secondary | ICD-10-CM | POA: Diagnosis not present

## 2016-06-21 DIAGNOSIS — J309 Allergic rhinitis, unspecified: Secondary | ICD-10-CM | POA: Diagnosis not present

## 2016-06-21 DIAGNOSIS — M13869 Other specified arthritis, unspecified knee: Secondary | ICD-10-CM | POA: Diagnosis not present

## 2016-06-21 DIAGNOSIS — M545 Low back pain: Secondary | ICD-10-CM | POA: Diagnosis not present

## 2016-06-21 DIAGNOSIS — I2510 Atherosclerotic heart disease of native coronary artery without angina pectoris: Secondary | ICD-10-CM | POA: Diagnosis not present

## 2016-06-21 DIAGNOSIS — I10 Essential (primary) hypertension: Secondary | ICD-10-CM | POA: Diagnosis not present

## 2016-06-21 DIAGNOSIS — I712 Thoracic aortic aneurysm, without rupture: Secondary | ICD-10-CM | POA: Diagnosis not present

## 2016-06-21 DIAGNOSIS — E785 Hyperlipidemia, unspecified: Secondary | ICD-10-CM | POA: Diagnosis not present

## 2016-06-21 DIAGNOSIS — I351 Nonrheumatic aortic (valve) insufficiency: Secondary | ICD-10-CM | POA: Diagnosis not present

## 2016-06-21 DIAGNOSIS — I25810 Atherosclerosis of coronary artery bypass graft(s) without angina pectoris: Secondary | ICD-10-CM | POA: Diagnosis not present

## 2016-06-23 DIAGNOSIS — M179 Osteoarthritis of knee, unspecified: Secondary | ICD-10-CM | POA: Insufficient documentation

## 2016-06-23 DIAGNOSIS — M170 Bilateral primary osteoarthritis of knee: Secondary | ICD-10-CM | POA: Diagnosis not present

## 2016-08-18 DIAGNOSIS — N184 Chronic kidney disease, stage 4 (severe): Secondary | ICD-10-CM | POA: Diagnosis not present

## 2016-08-18 DIAGNOSIS — N2581 Secondary hyperparathyroidism of renal origin: Secondary | ICD-10-CM | POA: Diagnosis not present

## 2016-08-18 DIAGNOSIS — D631 Anemia in chronic kidney disease: Secondary | ICD-10-CM | POA: Diagnosis not present

## 2016-08-18 DIAGNOSIS — I10 Essential (primary) hypertension: Secondary | ICD-10-CM | POA: Diagnosis not present

## 2016-08-18 DIAGNOSIS — E1122 Type 2 diabetes mellitus with diabetic chronic kidney disease: Secondary | ICD-10-CM | POA: Diagnosis not present

## 2016-08-19 DIAGNOSIS — I4891 Unspecified atrial fibrillation: Secondary | ICD-10-CM | POA: Diagnosis not present

## 2016-08-19 DIAGNOSIS — I10 Essential (primary) hypertension: Secondary | ICD-10-CM | POA: Diagnosis not present

## 2016-08-19 DIAGNOSIS — E1122 Type 2 diabetes mellitus with diabetic chronic kidney disease: Secondary | ICD-10-CM | POA: Diagnosis not present

## 2016-08-19 DIAGNOSIS — E785 Hyperlipidemia, unspecified: Secondary | ICD-10-CM | POA: Diagnosis not present

## 2016-08-24 DIAGNOSIS — M545 Low back pain: Secondary | ICD-10-CM | POA: Diagnosis not present

## 2016-08-24 DIAGNOSIS — I351 Nonrheumatic aortic (valve) insufficiency: Secondary | ICD-10-CM | POA: Diagnosis not present

## 2016-08-24 DIAGNOSIS — M13869 Other specified arthritis, unspecified knee: Secondary | ICD-10-CM | POA: Diagnosis not present

## 2016-08-24 DIAGNOSIS — J309 Allergic rhinitis, unspecified: Secondary | ICD-10-CM | POA: Diagnosis not present

## 2016-08-24 DIAGNOSIS — I712 Thoracic aortic aneurysm, without rupture: Secondary | ICD-10-CM | POA: Diagnosis not present

## 2016-08-24 DIAGNOSIS — I10 Essential (primary) hypertension: Secondary | ICD-10-CM | POA: Diagnosis not present

## 2016-08-24 DIAGNOSIS — D649 Anemia, unspecified: Secondary | ICD-10-CM | POA: Diagnosis not present

## 2016-08-24 DIAGNOSIS — I25810 Atherosclerosis of coronary artery bypass graft(s) without angina pectoris: Secondary | ICD-10-CM | POA: Diagnosis not present

## 2016-08-24 DIAGNOSIS — I4891 Unspecified atrial fibrillation: Secondary | ICD-10-CM | POA: Diagnosis not present

## 2016-08-24 DIAGNOSIS — E785 Hyperlipidemia, unspecified: Secondary | ICD-10-CM | POA: Diagnosis not present

## 2016-08-24 DIAGNOSIS — E1122 Type 2 diabetes mellitus with diabetic chronic kidney disease: Secondary | ICD-10-CM | POA: Diagnosis not present

## 2016-08-24 DIAGNOSIS — I340 Nonrheumatic mitral (valve) insufficiency: Secondary | ICD-10-CM | POA: Diagnosis not present

## 2016-09-20 DIAGNOSIS — I2510 Atherosclerotic heart disease of native coronary artery without angina pectoris: Secondary | ICD-10-CM | POA: Diagnosis not present

## 2016-09-20 DIAGNOSIS — Z951 Presence of aortocoronary bypass graft: Secondary | ICD-10-CM | POA: Diagnosis not present

## 2016-09-20 DIAGNOSIS — I10 Essential (primary) hypertension: Secondary | ICD-10-CM | POA: Diagnosis not present

## 2016-09-20 DIAGNOSIS — I509 Heart failure, unspecified: Secondary | ICD-10-CM | POA: Diagnosis not present

## 2016-11-08 DIAGNOSIS — E1122 Type 2 diabetes mellitus with diabetic chronic kidney disease: Secondary | ICD-10-CM | POA: Diagnosis not present

## 2016-11-08 DIAGNOSIS — N2581 Secondary hyperparathyroidism of renal origin: Secondary | ICD-10-CM | POA: Diagnosis not present

## 2016-11-08 DIAGNOSIS — I10 Essential (primary) hypertension: Secondary | ICD-10-CM | POA: Diagnosis not present

## 2016-11-08 DIAGNOSIS — N184 Chronic kidney disease, stage 4 (severe): Secondary | ICD-10-CM | POA: Diagnosis not present

## 2016-11-08 DIAGNOSIS — D631 Anemia in chronic kidney disease: Secondary | ICD-10-CM | POA: Diagnosis not present

## 2016-11-30 DIAGNOSIS — Z951 Presence of aortocoronary bypass graft: Secondary | ICD-10-CM | POA: Diagnosis not present

## 2016-11-30 DIAGNOSIS — I10 Essential (primary) hypertension: Secondary | ICD-10-CM | POA: Diagnosis not present

## 2016-11-30 DIAGNOSIS — E1122 Type 2 diabetes mellitus with diabetic chronic kidney disease: Secondary | ICD-10-CM | POA: Diagnosis not present

## 2016-11-30 DIAGNOSIS — E785 Hyperlipidemia, unspecified: Secondary | ICD-10-CM | POA: Diagnosis not present

## 2016-12-01 DIAGNOSIS — M13869 Other specified arthritis, unspecified knee: Secondary | ICD-10-CM | POA: Diagnosis not present

## 2016-12-01 DIAGNOSIS — I712 Thoracic aortic aneurysm, without rupture: Secondary | ICD-10-CM | POA: Diagnosis not present

## 2016-12-01 DIAGNOSIS — D649 Anemia, unspecified: Secondary | ICD-10-CM | POA: Diagnosis not present

## 2016-12-01 DIAGNOSIS — M545 Low back pain: Secondary | ICD-10-CM | POA: Diagnosis not present

## 2016-12-01 DIAGNOSIS — I10 Essential (primary) hypertension: Secondary | ICD-10-CM | POA: Diagnosis not present

## 2016-12-01 DIAGNOSIS — I2510 Atherosclerotic heart disease of native coronary artery without angina pectoris: Secondary | ICD-10-CM | POA: Diagnosis not present

## 2016-12-01 DIAGNOSIS — I340 Nonrheumatic mitral (valve) insufficiency: Secondary | ICD-10-CM | POA: Diagnosis not present

## 2016-12-01 DIAGNOSIS — J309 Allergic rhinitis, unspecified: Secondary | ICD-10-CM | POA: Diagnosis not present

## 2016-12-01 DIAGNOSIS — E1122 Type 2 diabetes mellitus with diabetic chronic kidney disease: Secondary | ICD-10-CM | POA: Diagnosis not present

## 2016-12-01 DIAGNOSIS — I25810 Atherosclerosis of coronary artery bypass graft(s) without angina pectoris: Secondary | ICD-10-CM | POA: Diagnosis not present

## 2016-12-01 DIAGNOSIS — E785 Hyperlipidemia, unspecified: Secondary | ICD-10-CM | POA: Diagnosis not present

## 2016-12-01 DIAGNOSIS — I4891 Unspecified atrial fibrillation: Secondary | ICD-10-CM | POA: Diagnosis not present

## 2016-12-20 DIAGNOSIS — M25562 Pain in left knee: Secondary | ICD-10-CM | POA: Diagnosis not present

## 2016-12-20 DIAGNOSIS — M25561 Pain in right knee: Secondary | ICD-10-CM | POA: Diagnosis not present

## 2016-12-20 DIAGNOSIS — M170 Bilateral primary osteoarthritis of knee: Secondary | ICD-10-CM | POA: Diagnosis not present

## 2016-12-27 DIAGNOSIS — M1711 Unilateral primary osteoarthritis, right knee: Secondary | ICD-10-CM | POA: Diagnosis not present

## 2016-12-27 DIAGNOSIS — M25561 Pain in right knee: Secondary | ICD-10-CM | POA: Diagnosis not present

## 2016-12-30 DIAGNOSIS — M1712 Unilateral primary osteoarthritis, left knee: Secondary | ICD-10-CM | POA: Diagnosis not present

## 2016-12-30 DIAGNOSIS — M25562 Pain in left knee: Secondary | ICD-10-CM | POA: Diagnosis not present

## 2017-01-10 DIAGNOSIS — M25562 Pain in left knee: Secondary | ICD-10-CM | POA: Diagnosis not present

## 2017-01-10 DIAGNOSIS — M25561 Pain in right knee: Secondary | ICD-10-CM | POA: Diagnosis not present

## 2017-01-10 DIAGNOSIS — M170 Bilateral primary osteoarthritis of knee: Secondary | ICD-10-CM | POA: Diagnosis not present

## 2017-01-28 DIAGNOSIS — I509 Heart failure, unspecified: Secondary | ICD-10-CM | POA: Diagnosis not present

## 2017-01-28 DIAGNOSIS — I10 Essential (primary) hypertension: Secondary | ICD-10-CM | POA: Diagnosis not present

## 2017-01-28 DIAGNOSIS — I719 Aortic aneurysm of unspecified site, without rupture: Secondary | ICD-10-CM | POA: Diagnosis not present

## 2017-01-28 DIAGNOSIS — I340 Nonrheumatic mitral (valve) insufficiency: Secondary | ICD-10-CM | POA: Diagnosis not present

## 2017-01-28 DIAGNOSIS — I351 Nonrheumatic aortic (valve) insufficiency: Secondary | ICD-10-CM | POA: Diagnosis not present

## 2017-01-28 DIAGNOSIS — R011 Cardiac murmur, unspecified: Secondary | ICD-10-CM | POA: Diagnosis not present

## 2017-01-31 DIAGNOSIS — I10 Essential (primary) hypertension: Secondary | ICD-10-CM | POA: Diagnosis not present

## 2017-01-31 DIAGNOSIS — E1122 Type 2 diabetes mellitus with diabetic chronic kidney disease: Secondary | ICD-10-CM | POA: Diagnosis not present

## 2017-01-31 DIAGNOSIS — D631 Anemia in chronic kidney disease: Secondary | ICD-10-CM | POA: Diagnosis not present

## 2017-01-31 DIAGNOSIS — N184 Chronic kidney disease, stage 4 (severe): Secondary | ICD-10-CM | POA: Diagnosis not present

## 2017-01-31 DIAGNOSIS — N2581 Secondary hyperparathyroidism of renal origin: Secondary | ICD-10-CM | POA: Diagnosis not present

## 2017-03-14 DIAGNOSIS — E1122 Type 2 diabetes mellitus with diabetic chronic kidney disease: Secondary | ICD-10-CM | POA: Diagnosis not present

## 2017-03-14 DIAGNOSIS — I2510 Atherosclerotic heart disease of native coronary artery without angina pectoris: Secondary | ICD-10-CM | POA: Diagnosis not present

## 2017-03-16 DIAGNOSIS — E1122 Type 2 diabetes mellitus with diabetic chronic kidney disease: Secondary | ICD-10-CM | POA: Diagnosis not present

## 2017-03-16 DIAGNOSIS — J309 Allergic rhinitis, unspecified: Secondary | ICD-10-CM | POA: Diagnosis not present

## 2017-03-16 DIAGNOSIS — I340 Nonrheumatic mitral (valve) insufficiency: Secondary | ICD-10-CM | POA: Diagnosis not present

## 2017-03-16 DIAGNOSIS — D649 Anemia, unspecified: Secondary | ICD-10-CM | POA: Diagnosis not present

## 2017-03-16 DIAGNOSIS — I10 Essential (primary) hypertension: Secondary | ICD-10-CM | POA: Diagnosis not present

## 2017-03-16 DIAGNOSIS — M545 Low back pain: Secondary | ICD-10-CM | POA: Diagnosis not present

## 2017-03-16 DIAGNOSIS — E785 Hyperlipidemia, unspecified: Secondary | ICD-10-CM | POA: Diagnosis not present

## 2017-03-16 DIAGNOSIS — I25810 Atherosclerosis of coronary artery bypass graft(s) without angina pectoris: Secondary | ICD-10-CM | POA: Diagnosis not present

## 2017-03-16 DIAGNOSIS — I4891 Unspecified atrial fibrillation: Secondary | ICD-10-CM | POA: Diagnosis not present

## 2017-03-16 DIAGNOSIS — M13869 Other specified arthritis, unspecified knee: Secondary | ICD-10-CM | POA: Diagnosis not present

## 2017-03-16 DIAGNOSIS — I2510 Atherosclerotic heart disease of native coronary artery without angina pectoris: Secondary | ICD-10-CM | POA: Diagnosis not present

## 2017-03-16 DIAGNOSIS — I712 Thoracic aortic aneurysm, without rupture: Secondary | ICD-10-CM | POA: Diagnosis not present

## 2017-03-21 DIAGNOSIS — L814 Other melanin hyperpigmentation: Secondary | ICD-10-CM | POA: Diagnosis not present

## 2017-03-21 DIAGNOSIS — L821 Other seborrheic keratosis: Secondary | ICD-10-CM | POA: Diagnosis not present

## 2017-03-21 DIAGNOSIS — L570 Actinic keratosis: Secondary | ICD-10-CM | POA: Diagnosis not present

## 2017-03-25 DIAGNOSIS — Z23 Encounter for immunization: Secondary | ICD-10-CM | POA: Diagnosis not present

## 2017-04-11 DIAGNOSIS — I509 Heart failure, unspecified: Secondary | ICD-10-CM | POA: Diagnosis not present

## 2017-04-11 DIAGNOSIS — R197 Diarrhea, unspecified: Secondary | ICD-10-CM | POA: Diagnosis not present

## 2017-04-11 DIAGNOSIS — I10 Essential (primary) hypertension: Secondary | ICD-10-CM | POA: Diagnosis not present

## 2017-04-12 DIAGNOSIS — R197 Diarrhea, unspecified: Secondary | ICD-10-CM | POA: Diagnosis not present

## 2017-04-25 DIAGNOSIS — D631 Anemia in chronic kidney disease: Secondary | ICD-10-CM | POA: Diagnosis not present

## 2017-04-25 DIAGNOSIS — I10 Essential (primary) hypertension: Secondary | ICD-10-CM | POA: Diagnosis not present

## 2017-04-25 DIAGNOSIS — N184 Chronic kidney disease, stage 4 (severe): Secondary | ICD-10-CM | POA: Diagnosis not present

## 2017-04-25 DIAGNOSIS — N2581 Secondary hyperparathyroidism of renal origin: Secondary | ICD-10-CM | POA: Diagnosis not present

## 2017-04-25 DIAGNOSIS — E1122 Type 2 diabetes mellitus with diabetic chronic kidney disease: Secondary | ICD-10-CM | POA: Diagnosis not present

## 2017-05-26 DIAGNOSIS — I2510 Atherosclerotic heart disease of native coronary artery without angina pectoris: Secondary | ICD-10-CM | POA: Diagnosis not present

## 2017-05-26 DIAGNOSIS — R0602 Shortness of breath: Secondary | ICD-10-CM | POA: Diagnosis not present

## 2017-05-26 DIAGNOSIS — I25810 Atherosclerosis of coronary artery bypass graft(s) without angina pectoris: Secondary | ICD-10-CM | POA: Diagnosis not present

## 2017-05-26 DIAGNOSIS — I10 Essential (primary) hypertension: Secondary | ICD-10-CM | POA: Diagnosis not present

## 2017-05-26 DIAGNOSIS — E785 Hyperlipidemia, unspecified: Secondary | ICD-10-CM | POA: Diagnosis not present

## 2017-05-30 DIAGNOSIS — R079 Chest pain, unspecified: Secondary | ICD-10-CM | POA: Diagnosis not present

## 2017-06-02 DIAGNOSIS — H2513 Age-related nuclear cataract, bilateral: Secondary | ICD-10-CM | POA: Diagnosis not present

## 2017-06-02 DIAGNOSIS — I351 Nonrheumatic aortic (valve) insufficiency: Secondary | ICD-10-CM | POA: Diagnosis not present

## 2017-06-02 DIAGNOSIS — I4891 Unspecified atrial fibrillation: Secondary | ICD-10-CM | POA: Diagnosis not present

## 2017-06-02 DIAGNOSIS — I719 Aortic aneurysm of unspecified site, without rupture: Secondary | ICD-10-CM | POA: Diagnosis not present

## 2017-06-02 DIAGNOSIS — I2510 Atherosclerotic heart disease of native coronary artery without angina pectoris: Secondary | ICD-10-CM | POA: Diagnosis not present

## 2017-06-02 DIAGNOSIS — I509 Heart failure, unspecified: Secondary | ICD-10-CM | POA: Diagnosis not present

## 2017-06-07 DIAGNOSIS — J209 Acute bronchitis, unspecified: Secondary | ICD-10-CM | POA: Diagnosis not present

## 2017-06-16 DIAGNOSIS — E1122 Type 2 diabetes mellitus with diabetic chronic kidney disease: Secondary | ICD-10-CM | POA: Diagnosis not present

## 2017-06-20 DIAGNOSIS — D649 Anemia, unspecified: Secondary | ICD-10-CM | POA: Diagnosis not present

## 2017-06-20 DIAGNOSIS — E785 Hyperlipidemia, unspecified: Secondary | ICD-10-CM | POA: Diagnosis not present

## 2017-06-20 DIAGNOSIS — I351 Nonrheumatic aortic (valve) insufficiency: Secondary | ICD-10-CM | POA: Diagnosis not present

## 2017-06-20 DIAGNOSIS — M545 Low back pain: Secondary | ICD-10-CM | POA: Diagnosis not present

## 2017-06-20 DIAGNOSIS — M13869 Other specified arthritis, unspecified knee: Secondary | ICD-10-CM | POA: Diagnosis not present

## 2017-06-20 DIAGNOSIS — I4891 Unspecified atrial fibrillation: Secondary | ICD-10-CM | POA: Diagnosis not present

## 2017-06-20 DIAGNOSIS — I25810 Atherosclerosis of coronary artery bypass graft(s) without angina pectoris: Secondary | ICD-10-CM | POA: Diagnosis not present

## 2017-06-20 DIAGNOSIS — I712 Thoracic aortic aneurysm, without rupture: Secondary | ICD-10-CM | POA: Diagnosis not present

## 2017-06-20 DIAGNOSIS — J309 Allergic rhinitis, unspecified: Secondary | ICD-10-CM | POA: Diagnosis not present

## 2017-06-20 DIAGNOSIS — G4700 Insomnia, unspecified: Secondary | ICD-10-CM | POA: Diagnosis not present

## 2017-06-20 DIAGNOSIS — I10 Essential (primary) hypertension: Secondary | ICD-10-CM | POA: Diagnosis not present

## 2017-06-20 DIAGNOSIS — E1122 Type 2 diabetes mellitus with diabetic chronic kidney disease: Secondary | ICD-10-CM | POA: Diagnosis not present

## 2017-07-11 DIAGNOSIS — H2512 Age-related nuclear cataract, left eye: Secondary | ICD-10-CM | POA: Diagnosis not present

## 2017-07-13 ENCOUNTER — Encounter: Payer: Self-pay | Admitting: *Deleted

## 2017-07-18 DIAGNOSIS — D631 Anemia in chronic kidney disease: Secondary | ICD-10-CM | POA: Diagnosis not present

## 2017-07-18 DIAGNOSIS — E1122 Type 2 diabetes mellitus with diabetic chronic kidney disease: Secondary | ICD-10-CM | POA: Diagnosis not present

## 2017-07-18 DIAGNOSIS — N2581 Secondary hyperparathyroidism of renal origin: Secondary | ICD-10-CM | POA: Diagnosis not present

## 2017-07-18 DIAGNOSIS — N184 Chronic kidney disease, stage 4 (severe): Secondary | ICD-10-CM | POA: Diagnosis not present

## 2017-07-18 DIAGNOSIS — I10 Essential (primary) hypertension: Secondary | ICD-10-CM | POA: Diagnosis not present

## 2017-07-26 ENCOUNTER — Ambulatory Visit: Payer: Medicare Other | Admitting: Certified Registered Nurse Anesthetist

## 2017-07-26 ENCOUNTER — Encounter
Admission: RE | Disposition: A | Discharge status: Home / Self Care | Payer: Self-pay | Source: Ambulatory Visit | Attending: Ophthalmology

## 2017-07-26 ENCOUNTER — Ambulatory Visit
Admission: RE | Admit: 2017-07-26 | Discharge: 2017-07-26 | Disposition: A | Discharge status: Home / Self Care | Payer: Medicare Other | Source: Ambulatory Visit | Attending: Ophthalmology | Admitting: Ophthalmology

## 2017-07-26 ENCOUNTER — Encounter: Payer: Self-pay | Admitting: Certified Registered Nurse Anesthetist

## 2017-07-26 ENCOUNTER — Other Ambulatory Visit: Payer: Self-pay

## 2017-07-26 DIAGNOSIS — E785 Hyperlipidemia, unspecified: Secondary | ICD-10-CM | POA: Diagnosis not present

## 2017-07-26 DIAGNOSIS — I2510 Atherosclerotic heart disease of native coronary artery without angina pectoris: Secondary | ICD-10-CM | POA: Diagnosis not present

## 2017-07-26 DIAGNOSIS — Z7984 Long term (current) use of oral hypoglycemic drugs: Secondary | ICD-10-CM | POA: Insufficient documentation

## 2017-07-26 DIAGNOSIS — N189 Chronic kidney disease, unspecified: Secondary | ICD-10-CM | POA: Diagnosis not present

## 2017-07-26 DIAGNOSIS — Z7982 Long term (current) use of aspirin: Secondary | ICD-10-CM | POA: Diagnosis not present

## 2017-07-26 DIAGNOSIS — Z7951 Long term (current) use of inhaled steroids: Secondary | ICD-10-CM | POA: Diagnosis not present

## 2017-07-26 DIAGNOSIS — Z951 Presence of aortocoronary bypass graft: Secondary | ICD-10-CM | POA: Insufficient documentation

## 2017-07-26 DIAGNOSIS — E1122 Type 2 diabetes mellitus with diabetic chronic kidney disease: Secondary | ICD-10-CM | POA: Insufficient documentation

## 2017-07-26 DIAGNOSIS — E7800 Pure hypercholesterolemia, unspecified: Secondary | ICD-10-CM | POA: Insufficient documentation

## 2017-07-26 DIAGNOSIS — I509 Heart failure, unspecified: Secondary | ICD-10-CM | POA: Diagnosis not present

## 2017-07-26 DIAGNOSIS — I130 Hypertensive heart and chronic kidney disease with heart failure and stage 1 through stage 4 chronic kidney disease, or unspecified chronic kidney disease: Secondary | ICD-10-CM | POA: Insufficient documentation

## 2017-07-26 DIAGNOSIS — Z955 Presence of coronary angioplasty implant and graft: Secondary | ICD-10-CM | POA: Diagnosis not present

## 2017-07-26 DIAGNOSIS — D631 Anemia in chronic kidney disease: Secondary | ICD-10-CM | POA: Diagnosis not present

## 2017-07-26 DIAGNOSIS — H2512 Age-related nuclear cataract, left eye: Secondary | ICD-10-CM | POA: Diagnosis not present

## 2017-07-26 DIAGNOSIS — I25110 Atherosclerotic heart disease of native coronary artery with unstable angina pectoris: Secondary | ICD-10-CM | POA: Diagnosis not present

## 2017-07-26 DIAGNOSIS — Z87891 Personal history of nicotine dependence: Secondary | ICD-10-CM | POA: Diagnosis not present

## 2017-07-26 DIAGNOSIS — I252 Old myocardial infarction: Secondary | ICD-10-CM | POA: Insufficient documentation

## 2017-07-26 DIAGNOSIS — Z79899 Other long term (current) drug therapy: Secondary | ICD-10-CM | POA: Insufficient documentation

## 2017-07-26 HISTORY — DX: Edema, unspecified: R60.9

## 2017-07-26 HISTORY — PX: CATARACT EXTRACTION W/PHACO: SHX586

## 2017-07-26 HISTORY — DX: Anemia, unspecified: D64.9

## 2017-07-26 HISTORY — DX: Chronic kidney disease, unspecified: N18.9

## 2017-07-26 HISTORY — DX: Heart failure, unspecified: I50.9

## 2017-07-26 LAB — GLUCOSE, CAPILLARY
GLUCOSE-CAPILLARY: 54 mg/dL — AB (ref 65–99)
Glucose-Capillary: 98 mg/dL (ref 65–99)

## 2017-07-26 SURGERY — PHACOEMULSIFICATION, CATARACT, WITH IOL INSERTION
Anesthesia: Monitor Anesthesia Care | Site: Eye | Laterality: Left | Wound class: Clean

## 2017-07-26 MED ORDER — ARMC OPHTHALMIC DILATING DROPS
OPHTHALMIC | Status: AC
  Administered 2017-07-26: 1 via OPHTHALMIC
  Filled 2017-07-26: qty 0.4

## 2017-07-26 MED ORDER — LIDOCAINE HCL (PF) 4 % IJ SOLN
INTRAOCULAR | Status: DC | PRN
  Administered 2017-07-26: 4 mL via OPHTHALMIC

## 2017-07-26 MED ORDER — MOXIFLOXACIN HCL 0.5 % OP SOLN: 1.0000 [drp] | OPHTHALMIC | Status: DC | PRN

## 2017-07-26 MED ORDER — NA CHONDROIT SULF-NA HYALURON 40-17 MG/ML IO SOLN
INTRAOCULAR | Status: AC
  Filled 2017-07-26: qty 1

## 2017-07-26 MED ORDER — DEXTROSE 50 % IV SOLN
12.5000 g | Freq: Once | INTRAVENOUS | Status: AC
  Administered 2017-07-26: 12.5 g via INTRAVENOUS

## 2017-07-26 MED ORDER — POVIDONE-IODINE 5 % OP SOLN
OPHTHALMIC | Status: DC | PRN
  Administered 2017-07-26: 1 via OPHTHALMIC

## 2017-07-26 MED ORDER — MIDAZOLAM HCL 2 MG/2ML IJ SOLN
INTRAMUSCULAR | Status: AC
  Filled 2017-07-26: qty 2

## 2017-07-26 MED ORDER — ARMC OPHTHALMIC DILATING DROPS
1.0000 "application " | OPHTHALMIC | Status: AC
  Administered 2017-07-26 (×3): 1 via OPHTHALMIC

## 2017-07-26 MED ORDER — MOXIFLOXACIN HCL 0.5 % OP SOLN
OPHTHALMIC | Status: DC | PRN
  Administered 2017-07-26: 0.2 mL via OPHTHALMIC

## 2017-07-26 MED ORDER — LIDOCAINE HCL (PF) 4 % IJ SOLN
INTRAMUSCULAR | Status: AC
  Filled 2017-07-26: qty 5

## 2017-07-26 MED ORDER — DEXTROSE 50 % IV SOLN
INTRAVENOUS | Status: AC
  Filled 2017-07-26: qty 50

## 2017-07-26 MED ORDER — MOXIFLOXACIN HCL 0.5 % OP SOLN
OPHTHALMIC | Status: AC
  Filled 2017-07-26: qty 3

## 2017-07-26 MED ORDER — MIDAZOLAM HCL 2 MG/2ML IJ SOLN
INTRAMUSCULAR | Status: DC | PRN
  Administered 2017-07-26: 1 mg via INTRAVENOUS

## 2017-07-26 MED ORDER — POVIDONE-IODINE 5 % OP SOLN
OPHTHALMIC | Status: AC
  Filled 2017-07-26: qty 30

## 2017-07-26 MED ORDER — EPINEPHRINE PF 1 MG/ML IJ SOLN
INTRAMUSCULAR | Status: AC
  Filled 2017-07-26: qty 2

## 2017-07-26 MED ORDER — SODIUM CHLORIDE 0.9 % IV SOLN
INTRAVENOUS | Status: DC
  Administered 2017-07-26: 08:00:00 via INTRAVENOUS

## 2017-07-26 MED ORDER — EPINEPHRINE PF 1 MG/ML IJ SOLN
INTRAOCULAR | Status: DC | PRN
  Administered 2017-07-26: 10:00:00 via OPHTHALMIC

## 2017-07-26 MED ORDER — CARBACHOL 0.01 % IO SOLN
INTRAOCULAR | Status: DC | PRN
  Administered 2017-07-26: 0.5 mL via INTRAOCULAR

## 2017-07-26 MED ORDER — NA CHONDROIT SULF-NA HYALURON 40-17 MG/ML IO SOLN
INTRAOCULAR | Status: DC | PRN
  Administered 2017-07-26: 1 mL via INTRAOCULAR

## 2017-07-26 SURGICAL SUPPLY — 16 items
GLOVE BIO SURGEON STRL SZ8 (GLOVE) ×3 IMPLANT
GLOVE BIOGEL M 6.5 STRL (GLOVE) ×3 IMPLANT
GLOVE SURG LX 8.0 MICRO (GLOVE) ×2
GLOVE SURG LX STRL 8.0 MICRO (GLOVE) ×1 IMPLANT
GOWN STRL REUS W/ TWL LRG LVL3 (GOWN DISPOSABLE) ×2 IMPLANT
GOWN STRL REUS W/TWL LRG LVL3 (GOWN DISPOSABLE) ×4
LABEL CATARACT MEDS ST (LABEL) ×3 IMPLANT
LENS IOL TECNIS ITEC 17.5 (Intraocular Lens) ×3 IMPLANT
PACK CATARACT (MISCELLANEOUS) ×3 IMPLANT
PACK CATARACT BRASINGTON LX (MISCELLANEOUS) ×3 IMPLANT
PACK EYE AFTER SURG (MISCELLANEOUS) ×3 IMPLANT
SOL BSS BAG (MISCELLANEOUS) ×3
SOLUTION BSS BAG (MISCELLANEOUS) ×1 IMPLANT
SYR 5ML LL (SYRINGE) ×3 IMPLANT
WATER STERILE IRR 250ML POUR (IV SOLUTION) ×3 IMPLANT
WIPE NON LINTING 3.25X3.25 (MISCELLANEOUS) ×3 IMPLANT

## 2017-07-26 NOTE — Anesthesia Post-op Follow-up Note (Signed)
Anesthesia QCDR form completed.        

## 2017-07-26 NOTE — Discharge Instructions (Signed)
Follow Dr. Inda Coke postop discharge instruction sheet as reviewed. Eye Surgery Discharge Instructions  Expect mild scratchy sensation or mild soreness. DO NOT RUB YOUR EYE!  The day of surgery:  Minimal physical activity, but bed rest is not required  No reading, computer work, or close hand work  No bending, lifting, or straining.  May watch TV  For 24 hours:  No driving, legal decisions, or alcoholic beverages  Safety precautions  Eat anything you prefer: It is better to start with liquids, then soup then solid foods.  _____ Eye patch should be worn until postoperative exam tomorrow.  ____ Solar shield eyeglasses should be worn for comfort in the sunlight/patch while sleeping  Resume all regular medications including aspirin or Coumadin if these were discontinued prior to surgery. You may shower, bathe, shave, or wash your hair. Tylenol may be taken for mild discomfort.  Call your doctor if you experience significant pain, nausea, or vomiting, fever > 101 or other signs of infection. [phone] or [phone] Specific instructions:  Follow-up Information    Birder Robson, MD Follow up.   Specialty:  Ophthalmology Why:  07/27/17 @ 1:25 PM Contact information: [address] [place] [address] [phone]

## 2017-07-26 NOTE — Transfer of Care (Signed)
Immediate Anesthesia Transfer of Care Note  Patient: Matthew [name]  Procedure(s) Performed: CATARACT EXTRACTION PHACO AND INTRAOCULAR LENS PLACEMENT (IOC) (Left Eye)  Patient Location: Short Stay  Anesthesia Type:MAC  Level of Consciousness: awake, alert , oriented and patient cooperative  Airway & Oxygen Therapy: Patient Spontanous Breathing  Post-op Assessment: Report given to RN and Post -op Vital signs reviewed and stable  Post vital signs: Reviewed and stable  Last Vitals:  Vitals:   07/26/17 0822  BP: (!) 141/61  Pulse: 64  Resp: 16  Temp: (!) 36.1 C  SpO2: 99%    Last Pain:  Vitals:   07/26/17 0822  TempSrc: Temporal         Complications: No apparent anesthesia complications

## 2017-07-26 NOTE — Op Note (Signed)
PREOPERATIVE DIAGNOSIS:  Nuclear sclerotic cataract of the left eye.   POSTOPERATIVE DIAGNOSIS:  Nuclear sclerotic cataract of the left eye.   OPERATIVE PROCEDURE: Procedure(s): CATARACT EXTRACTION PHACO AND INTRAOCULAR LENS PLACEMENT (IOC)   SURGEON:  Birder Robson, MD.   ANESTHESIA:  Anesthesiologist: Martha Clan, MD CRNA: Eben Burow, CRNA  1.      Managed anesthesia care. 2.     0.52ml of Shugarcaine was instilled following the paracentesis   COMPLICATIONS:  None.   TECHNIQUE:   Stop and chop   DESCRIPTION OF PROCEDURE:  The patient was examined and consented in the preoperative holding area where the aforementioned topical anesthesia was applied to the left eye and then brought back to the Operating Room where the left eye was prepped and draped in the usual sterile ophthalmic fashion and a lid speculum was placed. A paracentesis was created with the side port blade and the anterior chamber was filled with viscoelastic. A near clear corneal incision was performed with the steel keratome. A continuous curvilinear capsulorrhexis was performed with a cystotome followed by the capsulorrhexis forceps. Hydrodissection and hydrodelineation were carried out with BSS on a blunt cannula. The lens was removed in a stop and chop  technique and the remaining cortical material was removed with the irrigation-aspiration handpiece. The capsular bag was inflated with viscoelastic and the Technis [id number] lens was placed in the capsular bag without complication. The remaining viscoelastic was removed from the eye with the irrigation-aspiration handpiece. The wounds were hydrated. The anterior chamber was flushed with Miostat and the eye was inflated to physiologic pressure. 0.44ml Vigamox was placed in the anterior chamber. The wounds were found to be water tight. The eye was dressed with Vigamox. The patient was given protective glasses to wear throughout the day and a shield with which to sleep  tonight. The patient was also given drops with which to begin a drop regimen today and will follow-up with me in one day. Implant Name Type Inv. Item Serial No. Manufacturer Lot No. LRB No. Used  LENS IOL DIOP 17.5 - [id number] Intraocular Lens LENS IOL DIOP 17.5 [id number] AMO  Left 1    Procedure(s) with comments: CATARACT EXTRACTION PHACO AND INTRAOCULAR LENS PLACEMENT (IOC) (Left) - [place]   00:45.8 AP%  13.1 CDE  6.00 Fluid Pack Lot # [id number]  Electronically signed: Birder Robson 07/26/2017 9:58 AM

## 2017-07-26 NOTE — Anesthesia Preprocedure Evaluation (Signed)
Anesthesia Evaluation  Patient identified by MRN, date of birth, ID band Patient awake    Reviewed: Allergy & Precautions, H&P , NPO status , Patient's Chart, lab work & pertinent test results, reviewed documented beta blocker date and time   History of Anesthesia Complications Negative for: history of anesthetic complications  Airway Mallampati: III  TM Distance: >3 FB Neck ROM: full    Dental  (+) Partial Upper, Partial Lower, Poor Dentition, Missing   Pulmonary neg shortness of breath, neg COPD, neg recent URI, former smoker,           Cardiovascular Exercise Tolerance: Good hypertension, (-) angina+ CAD, + Past MI, + Cardiac Stents, + CABG and +CHF  (-) dysrhythmias (-) Valvular Problems/Murmurs     Neuro/Psych negative neurological ROS  negative psych ROS   GI/Hepatic negative GI ROS, Neg liver ROS,   Endo/Other  diabetes, Well Controlled, Oral Hypoglycemic Agents  Renal/GU CRFRenal disease  negative genitourinary   Musculoskeletal   Abdominal   Peds  Hematology negative hematology ROS (+)   Anesthesia Other Findings Past Medical History: No date: Anemia No date: Arthritis 05/2013: Broken arm     Comment:  Left No date: CHF (congestive heart failure) (HCC) No date: Chronic kidney disease No date: Coronary artery disease No date: Diabetes mellitus without complication (HCC) No date: Edema     Comment:  FEET/LEGS OCCAS No date: Hypertension No date: Myocardial infarction ([place])   Reproductive/Obstetrics negative OB ROS                             Anesthesia Physical Anesthesia Plan  ASA: III  Anesthesia Plan: MAC   Post-op Pain Management:    Induction: Intravenous  PONV Risk Score and Plan:   Airway Management Planned: Nasal Cannula  Additional Equipment:   Intra-op Plan:   Post-operative Plan:   Informed Consent: I have reviewed the patients History and  Physical, chart, labs and discussed the procedure including the risks, benefits and alternatives for the proposed anesthesia with the patient or authorized representative who has indicated his/her understanding and acceptance.   Dental Advisory Given  Plan Discussed with: Anesthesiologist, CRNA and Surgeon  Anesthesia Plan Comments:         Anesthesia Quick Evaluation

## 2017-07-26 NOTE — OR Nursing (Signed)
Patient presents this am with fsbs of 54, asymptomatic.  Dr Raphael Gibney notified, orders received.  FSBS recheck after intervention 98.

## 2017-07-26 NOTE — H&P (Signed)
All labs reviewed. Abnormal studies sent to patients PCP when indicated.  Previous H&P reviewed, patient examined, there are NO CHANGES.  Matthew Record Porfilio3/5/20199:36 AM

## 2017-07-26 NOTE — Anesthesia Postprocedure Evaluation (Signed)
Anesthesia Post Note  Patient: Matthew [name]  Procedure(s) Performed: CATARACT EXTRACTION PHACO AND INTRAOCULAR LENS PLACEMENT (I[facility]) (Left Eye)  Patient location during evaluation: PACU Anesthesia Type: MAC Level of consciousness: awake and alert Pain management: pain level controlled Vital Signs Assessment: post-procedure vital signs reviewed and stable Respiratory status: spontaneous breathing, nonlabored ventilation, respiratory function stable and patient connected to nasal cannula oxygen Cardiovascular status: stable and blood pressure returned to baseline Postop Assessment: no apparent nausea or vomiting Anesthetic complications: no     Last Vitals:  Vitals:   07/26/17 0822 07/26/17 1000  BP: (!) 141/61 136/62  Pulse: 64 (!) 55  Resp: 16 16  Temp: (!) 36.1 C 36.5 C  SpO2: 99% 99%    Last Pain:  Vitals:   07/26/17 1000  TempSrc: Oral                 Martha Clan

## 2017-08-02 DIAGNOSIS — M1712 Unilateral primary osteoarthritis, left knee: Secondary | ICD-10-CM | POA: Diagnosis not present

## 2017-08-02 DIAGNOSIS — Z789 Other specified health status: Secondary | ICD-10-CM | POA: Diagnosis not present

## 2017-08-02 DIAGNOSIS — M25561 Pain in right knee: Secondary | ICD-10-CM | POA: Diagnosis not present

## 2017-08-02 DIAGNOSIS — M25562 Pain in left knee: Secondary | ICD-10-CM | POA: Diagnosis not present

## 2017-08-02 DIAGNOSIS — M170 Bilateral primary osteoarthritis of knee: Secondary | ICD-10-CM | POA: Diagnosis not present

## 2017-08-02 DIAGNOSIS — M25461 Effusion, right knee: Secondary | ICD-10-CM | POA: Diagnosis not present

## 2017-08-02 DIAGNOSIS — M25462 Effusion, left knee: Secondary | ICD-10-CM | POA: Diagnosis not present

## 2017-08-09 DIAGNOSIS — H2511 Age-related nuclear cataract, right eye: Secondary | ICD-10-CM | POA: Diagnosis not present

## 2017-08-10 ENCOUNTER — Encounter: Payer: Self-pay | Admitting: *Deleted

## 2017-08-10 DIAGNOSIS — M25561 Pain in right knee: Secondary | ICD-10-CM | POA: Diagnosis not present

## 2017-08-10 DIAGNOSIS — M1711 Unilateral primary osteoarthritis, right knee: Secondary | ICD-10-CM | POA: Diagnosis not present

## 2017-08-16 DIAGNOSIS — M25562 Pain in left knee: Secondary | ICD-10-CM | POA: Diagnosis not present

## 2017-08-16 DIAGNOSIS — M1712 Unilateral primary osteoarthritis, left knee: Secondary | ICD-10-CM | POA: Diagnosis not present

## 2017-08-17 ENCOUNTER — Ambulatory Visit: Payer: Medicare Other | Admitting: Anesthesiology

## 2017-08-17 ENCOUNTER — Ambulatory Visit
Admission: RE | Admit: 2017-08-17 | Discharge: 2017-08-17 | Disposition: A | Discharge status: Home / Self Care | Payer: Medicare Other | Source: Ambulatory Visit | Attending: Ophthalmology | Admitting: Ophthalmology

## 2017-08-17 ENCOUNTER — Encounter: Payer: Self-pay | Admitting: *Deleted

## 2017-08-17 ENCOUNTER — Encounter
Admission: RE | Disposition: A | Discharge status: Home / Self Care | Payer: Self-pay | Source: Ambulatory Visit | Attending: Ophthalmology

## 2017-08-17 DIAGNOSIS — I25110 Atherosclerotic heart disease of native coronary artery with unstable angina pectoris: Secondary | ICD-10-CM | POA: Diagnosis not present

## 2017-08-17 DIAGNOSIS — H2511 Age-related nuclear cataract, right eye: Secondary | ICD-10-CM | POA: Diagnosis not present

## 2017-08-17 DIAGNOSIS — N189 Chronic kidney disease, unspecified: Secondary | ICD-10-CM | POA: Diagnosis not present

## 2017-08-17 DIAGNOSIS — Z87891 Personal history of nicotine dependence: Secondary | ICD-10-CM | POA: Diagnosis not present

## 2017-08-17 DIAGNOSIS — I2510 Atherosclerotic heart disease of native coronary artery without angina pectoris: Secondary | ICD-10-CM | POA: Insufficient documentation

## 2017-08-17 DIAGNOSIS — I130 Hypertensive heart and chronic kidney disease with heart failure and stage 1 through stage 4 chronic kidney disease, or unspecified chronic kidney disease: Secondary | ICD-10-CM | POA: Diagnosis not present

## 2017-08-17 DIAGNOSIS — Z7982 Long term (current) use of aspirin: Secondary | ICD-10-CM | POA: Insufficient documentation

## 2017-08-17 DIAGNOSIS — I509 Heart failure, unspecified: Secondary | ICD-10-CM | POA: Diagnosis not present

## 2017-08-17 DIAGNOSIS — Z955 Presence of coronary angioplasty implant and graft: Secondary | ICD-10-CM | POA: Insufficient documentation

## 2017-08-17 DIAGNOSIS — Z79899 Other long term (current) drug therapy: Secondary | ICD-10-CM | POA: Diagnosis not present

## 2017-08-17 DIAGNOSIS — Z7984 Long term (current) use of oral hypoglycemic drugs: Secondary | ICD-10-CM | POA: Insufficient documentation

## 2017-08-17 DIAGNOSIS — E7800 Pure hypercholesterolemia, unspecified: Secondary | ICD-10-CM | POA: Diagnosis not present

## 2017-08-17 DIAGNOSIS — E1122 Type 2 diabetes mellitus with diabetic chronic kidney disease: Secondary | ICD-10-CM | POA: Insufficient documentation

## 2017-08-17 DIAGNOSIS — Z951 Presence of aortocoronary bypass graft: Secondary | ICD-10-CM | POA: Insufficient documentation

## 2017-08-17 DIAGNOSIS — E785 Hyperlipidemia, unspecified: Secondary | ICD-10-CM | POA: Diagnosis not present

## 2017-08-17 HISTORY — DX: Dyspnea, unspecified: R06.00

## 2017-08-17 HISTORY — PX: CATARACT EXTRACTION W/PHACO: SHX586

## 2017-08-17 LAB — GLUCOSE, CAPILLARY: GLUCOSE-CAPILLARY: 81 mg/dL (ref 65–99)

## 2017-08-17 SURGERY — PHACOEMULSIFICATION, CATARACT, WITH IOL INSERTION
Anesthesia: General | Site: Eye | Laterality: Right | Wound class: "Clean "

## 2017-08-17 MED ORDER — POVIDONE-IODINE 5 % OP SOLN
OPHTHALMIC | Status: AC
  Filled 2017-08-17: qty 30

## 2017-08-17 MED ORDER — LIDOCAINE HCL (PF) 4 % IJ SOLN
INTRAMUSCULAR | Status: AC
  Filled 2017-08-17: qty 5

## 2017-08-17 MED ORDER — ARMC OPHTHALMIC DILATING DROPS
OPHTHALMIC | Status: AC
  Administered 2017-08-17 (×2)
  Filled 2017-08-17: qty 0.4

## 2017-08-17 MED ORDER — FENTANYL CITRATE (PF) 100 MCG/2ML IJ SOLN
INTRAMUSCULAR | Status: AC
  Filled 2017-08-17: qty 2

## 2017-08-17 MED ORDER — NA CHONDROIT SULF-NA HYALURON 40-17 MG/ML IO SOLN
INTRAOCULAR | Status: DC | PRN
  Administered 2017-08-17: 1 mL via INTRAOCULAR

## 2017-08-17 MED ORDER — POVIDONE-IODINE 5 % OP SOLN
OPHTHALMIC | Status: DC | PRN
  Administered 2017-08-17: 1 via OPHTHALMIC

## 2017-08-17 MED ORDER — LIDOCAINE HCL (PF) 4 % IJ SOLN
INTRAOCULAR | Status: DC | PRN
  Administered 2017-08-17: 2 mL via OPHTHALMIC

## 2017-08-17 MED ORDER — MOXIFLOXACIN HCL 0.5 % OP SOLN
OPHTHALMIC | Status: DC | PRN
  Administered 2017-08-17: .2 mL via OPHTHALMIC

## 2017-08-17 MED ORDER — MIDAZOLAM HCL 2 MG/2ML IJ SOLN
INTRAMUSCULAR | Status: AC
  Filled 2017-08-17: qty 2

## 2017-08-17 MED ORDER — NA CHONDROIT SULF-NA HYALURON 40-17 MG/ML IO SOLN
INTRAOCULAR | Status: AC
  Filled 2017-08-17: qty 1

## 2017-08-17 MED ORDER — EPINEPHRINE PF 1 MG/ML IJ SOLN
INTRAOCULAR | Status: DC | PRN
  Administered 2017-08-17: 1 mL via OPHTHALMIC

## 2017-08-17 MED ORDER — CARBACHOL 0.01 % IO SOLN
INTRAOCULAR | Status: DC | PRN
  Administered 2017-08-17: .5 mL via INTRAOCULAR

## 2017-08-17 MED ORDER — EPINEPHRINE PF 1 MG/ML IJ SOLN
INTRAMUSCULAR | Status: AC
  Filled 2017-08-17: qty 1

## 2017-08-17 MED ORDER — GLYCOPYRROLATE 0.2 MG/ML IJ SOLN
INTRAMUSCULAR | Status: DC | PRN
  Administered 2017-08-17: 0.1 mg via INTRAVENOUS

## 2017-08-17 MED ORDER — MOXIFLOXACIN HCL 0.5 % OP SOLN
OPHTHALMIC | Status: AC
  Filled 2017-08-17: qty 3

## 2017-08-17 MED ORDER — SODIUM CHLORIDE 0.9 % IV SOLN
INTRAVENOUS | Status: DC | PRN
  Administered 2017-08-17: 09:00:00 via INTRAVENOUS

## 2017-08-17 MED ORDER — MIDAZOLAM HCL 2 MG/2ML IJ SOLN
INTRAMUSCULAR | Status: DC | PRN
  Administered 2017-08-17: 2 mg via INTRAVENOUS

## 2017-08-17 MED ORDER — FENTANYL CITRATE (PF) 100 MCG/2ML IJ SOLN
INTRAMUSCULAR | Status: DC | PRN
  Administered 2017-08-17: 50 ug via INTRAVENOUS

## 2017-08-17 SURGICAL SUPPLY — 18 items
GLOVE BIO SURGEON STRL SZ8 (GLOVE) ×3 IMPLANT
GLOVE BIOGEL M 6.5 STRL (GLOVE) ×3 IMPLANT
GLOVE SURG LX 8.0 MICRO (GLOVE) ×2
GLOVE SURG LX STRL 8.0 MICRO (GLOVE) ×1 IMPLANT
GOWN STRL REUS W/ TWL LRG LVL3 (GOWN DISPOSABLE) ×2 IMPLANT
GOWN STRL REUS W/TWL LRG LVL3 (GOWN DISPOSABLE) ×4
LABEL CATARACT MEDS ST (LABEL) ×3 IMPLANT
LENS IOL TECNIS TRC I 225 18.5 IMPLANT
LENS IOL TORIC 18.5 ×2 IMPLANT
LENS IOL TORIC 225 18.5 ×1 IMPLANT
PACK CATARACT (MISCELLANEOUS) ×3 IMPLANT
PACK CATARACT BRASINGTON LX (MISCELLANEOUS) ×3 IMPLANT
PACK EYE AFTER SURG (MISCELLANEOUS) ×3 IMPLANT
SOL BSS BAG (MISCELLANEOUS) ×3
SOLUTION BSS BAG (MISCELLANEOUS) ×1 IMPLANT
SYR 5ML LL (SYRINGE) ×3 IMPLANT
WATER STERILE IRR 250ML POUR (IV SOLUTION) ×3 IMPLANT
WIPE NON LINTING 3.25X3.25 (MISCELLANEOUS) ×3 IMPLANT

## 2017-08-17 NOTE — Anesthesia Postprocedure Evaluation (Signed)
Anesthesia Post Note  Patient: [name]  Procedure(s) Performed: CATARACT EXTRACTION PHACO AND INTRAOCULAR LENS PLACEMENT ([place]) (Right Eye)  Patient location during evaluation: Endoscopy Anesthesia Type: General Level of consciousness: awake and alert Pain management: pain level controlled Vital Signs Assessment: post-procedure vital signs reviewed and stable Respiratory status: spontaneous breathing, nonlabored ventilation and respiratory function stable Cardiovascular status: blood pressure returned to baseline and stable Postop Assessment: no apparent nausea or vomiting Anesthetic complications: no     Last Vitals:  Vitals:   08/17/17 0922 08/17/17 0931  BP: (!) 112/53 (!) 107/54  Pulse:    Resp: 16   Temp: (!) 36.2 C   SpO2: 95%     Last Pain:  Vitals:   08/17/17 0920  TempSrc: Temporal  PainSc: 0-No pain                 Alphonsus Sias

## 2017-08-17 NOTE — Op Note (Signed)
PREOPERATIVE DIAGNOSIS:  Nuclear sclerotic cataract of the right eye.   POSTOPERATIVE DIAGNOSIS:  Nuclear sclerotic cataract of the right eye.   OPERATIVE PROCEDURE: Procedure(s): CATARACT EXTRACTION PHACO AND INTRAOCULAR LENS PLACEMENT ([facility])   SURGEON:  Birder Robson, MD.   ANESTHESIA: 1.      Managed anesthesia care. 2.     0.67ml of Shugarcaine was instilled following the paracentesis  Anesthesiologist: Alphonsus Sias, MD CRNA: Nile Riggs, CRNA; Jonna Clark, CRNA  COMPLICATIONS:  None.   TECHNIQUE:   Stop and chop    DESCRIPTION OF PROCEDURE:  The patient was examined and consented in the preoperative holding area where the aforementioned topical anesthesia was applied to the right eye.  The patient was brought back to the Operating Room where he was sat upright on the gurney and given a target to fixate upon while the eye was marked at the 3:00 and 9:00 position.  The patient was then reclined on the operating table.  The eye was prepped and draped in the usual sterile ophthalmic fashion and a lid speculum was placed. A paracentesis was created with the side port blade and the anterior chamber was filled with viscoelastic. A near clear corneal incision was performed with the steel keratome. A continuous curvilinear capsulorrhexis was performed with a cystotome followed by the capsulorrhexis forceps. Hydrodissection and hydrodelineation were carried out with BSS on a blunt cannula. The lens was removed in a stop and chop technique and the remaining cortical material was removed with the irrigation-aspiration handpiece. The eye was inflated with viscoelastic and the ZCT  lens  was placed in the eye and rotated to within a few degrees of the predetermined orientation.  The remaining viscoelastic was removed from the eye.  The [place] hook was used to rotate the toric lens into its final resting place at 075 degrees.  0. The eye was inflated to a physiologic pressure and found  to be watertight. 0.15ml of Vigamox was placed in the anterior chamber.  The eye was dressed with Vigamox. The patient was given protective glasses to wear throughout the day and a shield with which to sleep tonight. The patient was also given drops with which to begin a drop regimen today and will follow-up with me in one day. Implant Name Type Inv. Item Serial No. Manufacturer Lot No. LRB No. Used  LENS IOL TORIC 18.5 - [id number]  LENS IOL TORIC 18.5 [id number]  Right 1   Procedure(s) with comments: CATARACT EXTRACTION PHACO AND INTRAOCULAR LENS PLACEMENT ([facility]) (Right) - [place]  01:30 AP% 16.8 CDE 15.24 Fluid pack lot # [id number]  Electronically signed: Birder Robson 08/17/2017 9:18 AM

## 2017-08-17 NOTE — Discharge Instructions (Signed)

## 2017-08-17 NOTE — Anesthesia Post-op Follow-up Note (Signed)
Anesthesia QCDR form completed.        

## 2017-08-17 NOTE — Transfer of Care (Signed)
Immediate Anesthesia Transfer of Care Note  Patient: Matthew [name]  Procedure(s) Performed: CATARACT EXTRACTION PHACO AND INTRAOCULAR LENS PLACEMENT ([facility]) (Right Eye)  Patient Location: PACU and Short Stay  Anesthesia Type:General  Level of Consciousness: awake, alert , oriented and patient cooperative  Airway & Oxygen Therapy: Patient Spontanous Breathing  Post-op Assessment: Report given to RN, Post -op Vital signs reviewed and stable and Patient moving all extremities X 4  Post vital signs: Reviewed and stable  Last Vitals:  Vitals Value Taken Time  BP 112/53 08/17/2017  9:20 AM  Temp 36.2 C 08/17/2017  9:20 AM  Pulse 55 08/17/2017  9:20 AM  Resp 14 08/17/2017  9:20 AM  SpO2 96 % 08/17/2017  9:20 AM    Last Pain:  Vitals:   08/17/17 0920  TempSrc: Temporal  PainSc: 0-No pain         Complications: No apparent anesthesia complications

## 2017-08-17 NOTE — H&P (Signed)
All labs reviewed. Abnormal studies sent to patients PCP when indicated.  Previous H&P reviewed, patient examined, there are NO CHANGES.  Matthew Keel Porfilio3/27/20198:48 AM

## 2017-08-17 NOTE — Anesthesia Preprocedure Evaluation (Signed)
Anesthesia Evaluation  Patient identified by MRN, date of birth, ID band Patient awake    Reviewed: Allergy & Precautions, H&P , NPO status , reviewed documented beta blocker date and time   Airway Mallampati: III       Dental  (+) Partial Upper, Partial Lower, Chipped, Poor Dentition, Missing   Pulmonary shortness of breath, former smoker,    Pulmonary exam normal        Cardiovascular hypertension, + angina + CAD, + Past MI and +CHF  Normal cardiovascular exam  Study Conclusions  - Left ventricle: Systolic function was moderately reduced. The estimated ejection fraction was in the range of 35% to 40%. Severe hypokinesis of the inferolateral and inferior myocardium. Doppler parameters are consistent with restrictive physiology, indicative of decreased left ventricular diastolic compliance and/or increased left atrial pressure. Doppler parameters are consistent with elevated mean left atrial filling pressure. - Aortic valve: There was mild regurgitation. - Mitral valve: There was mild regurgitation. - Left atrium: The atrium was mildly dilated. - Pulmonary arteries: Systolic pressure was moderately increased. PA peak pressure: 52 mm Hg (S).   Neuro/Psych    GI/Hepatic   Endo/Other  diabetes  Renal/GU Renal disease     Musculoskeletal  (+) Arthritis , Osteoarthritis,    Abdominal   Peds  Hematology  (+) anemia ,   Anesthesia Other Findings Past Medical History: No date: Anemia No date: Arthritis 05/2013: Broken arm     Comment:  Left No date: CHF (congestive heart failure) (HCC) No date: Chronic kidney disease No date: Coronary artery disease No date: Diabetes mellitus without complication (HCC) No date: Edema     Comment:  FEET/LEGS OCCAS No date: Hypertension No date: Myocardial infarction ([place])  Reproductive/Obstetrics                             Anesthesia  Physical Anesthesia Plan  ASA: III  Anesthesia Plan: General and MAC   Post-op Pain Management:    Induction:   PONV Risk Score and Plan: 2 and TIVA  Airway Management Planned:   Additional Equipment:   Intra-op Plan:   Post-operative Plan:   Informed Consent: I have reviewed the patients History and Physical, chart, labs and discussed the procedure including the risks, benefits and alternatives for the proposed anesthesia with the patient or authorized representative who has indicated his/her understanding and acceptance.   Dental Advisory Given  Plan Discussed with: CRNA  Anesthesia Plan Comments:         Anesthesia Quick Evaluation

## 2017-08-25 ENCOUNTER — Encounter: Payer: Self-pay | Admitting: Emergency Medicine

## 2017-08-25 ENCOUNTER — Emergency Department
Admission: EM | Admit: 2017-08-25 | Discharge: 2017-08-25 | Disposition: A | Discharge status: Home / Self Care | Payer: Medicare Other | Attending: Emergency Medicine | Admitting: Emergency Medicine

## 2017-08-25 ENCOUNTER — Other Ambulatory Visit: Payer: Self-pay

## 2017-08-25 DIAGNOSIS — I2510 Atherosclerotic heart disease of native coronary artery without angina pectoris: Secondary | ICD-10-CM | POA: Diagnosis not present

## 2017-08-25 DIAGNOSIS — N189 Chronic kidney disease, unspecified: Secondary | ICD-10-CM | POA: Diagnosis not present

## 2017-08-25 DIAGNOSIS — Z87891 Personal history of nicotine dependence: Secondary | ICD-10-CM | POA: Insufficient documentation

## 2017-08-25 DIAGNOSIS — E162 Hypoglycemia, unspecified: Secondary | ICD-10-CM | POA: Diagnosis not present

## 2017-08-25 DIAGNOSIS — E860 Dehydration: Secondary | ICD-10-CM | POA: Insufficient documentation

## 2017-08-25 DIAGNOSIS — I130 Hypertensive heart and chronic kidney disease with heart failure and stage 1 through stage 4 chronic kidney disease, or unspecified chronic kidney disease: Secondary | ICD-10-CM | POA: Insufficient documentation

## 2017-08-25 DIAGNOSIS — I509 Heart failure, unspecified: Secondary | ICD-10-CM | POA: Insufficient documentation

## 2017-08-25 DIAGNOSIS — Z955 Presence of coronary angioplasty implant and graft: Secondary | ICD-10-CM | POA: Diagnosis not present

## 2017-08-25 DIAGNOSIS — N289 Disorder of kidney and ureter, unspecified: Secondary | ICD-10-CM | POA: Diagnosis not present

## 2017-08-25 DIAGNOSIS — E11649 Type 2 diabetes mellitus with hypoglycemia without coma: Secondary | ICD-10-CM | POA: Diagnosis not present

## 2017-08-25 LAB — URINALYSIS, COMPLETE (UACMP) WITH MICROSCOPIC
Bilirubin Urine: NEGATIVE
GLUCOSE, UA: 150 mg/dL — AB
HGB URINE DIPSTICK: NEGATIVE
Ketones, ur: NEGATIVE mg/dL
LEUKOCYTES UA: NEGATIVE
Nitrite: NEGATIVE
PROTEIN: 100 mg/dL — AB
Specific Gravity, Urine: 1.015 (ref 1.005–1.030)
WBC, UA: NONE SEEN WBC/hpf (ref 0–5)
pH: 5 (ref 5.0–8.0)

## 2017-08-25 LAB — COMPREHENSIVE METABOLIC PANEL
ALBUMIN: 3.8 g/dL (ref 3.5–5.0)
ALT: 18 U/L (ref 17–63)
AST: 32 U/L (ref 15–41)
Alkaline Phosphatase: 41 U/L (ref 38–126)
Anion gap: 8 (ref 5–15)
BUN: 31 mg/dL — AB (ref 6–20)
CHLORIDE: 113 mmol/L — AB (ref 101–111)
CO2: 22 mmol/L (ref 22–32)
CREATININE: 2.06 mg/dL — AB (ref 0.61–1.24)
Calcium: 8.8 mg/dL — ABNORMAL LOW (ref 8.9–10.3)
GFR calc Af Amer: 32 mL/min — ABNORMAL LOW (ref >60)
GFR calc non Af Amer: 28 mL/min — ABNORMAL LOW (ref >60)
Glucose, Bld: 90 mg/dL (ref 65–99)
Potassium: 3.4 mmol/L — ABNORMAL LOW (ref 3.5–5.1)
SODIUM: 143 mmol/L (ref 135–145)
Total Bilirubin: 0.8 mg/dL (ref 0.3–1.2)
Total Protein: 7.6 g/dL (ref 6.5–8.1)

## 2017-08-25 LAB — CBC WITH DIFFERENTIAL/PLATELET
BASOS ABS: 0 10*3/uL (ref 0–0.1)
Basophils Relative: 0 %
EOS ABS: 0.1 10*3/uL (ref 0–0.7)
EOS PCT: 1 %
HCT: 39.6 % — ABNORMAL LOW (ref 40.0–52.0)
Hemoglobin: 12.9 g/dL — ABNORMAL LOW (ref 13.0–18.0)
Lymphocytes Relative: 6 %
Lymphs Abs: 0.4 10*3/uL — ABNORMAL LOW (ref 1.0–3.6)
MCH: 30.3 pg (ref 26.0–34.0)
MCHC: 32.5 g/dL (ref 32.0–36.0)
MCV: 93.2 fL (ref 80.0–100.0)
Monocytes Absolute: 0.7 10*3/uL (ref 0.2–1.0)
Monocytes Relative: 9 %
Neutro Abs: 6.2 10*3/uL (ref 1.4–6.5)
Neutrophils Relative %: 84 %
PLATELETS: 174 10*3/uL (ref 150–440)
RBC: 4.25 MIL/uL — AB (ref 4.40–5.90)
RDW: 16.1 % — ABNORMAL HIGH (ref 11.5–14.5)
WBC: 7.4 10*3/uL (ref 3.8–10.6)

## 2017-08-25 LAB — BASIC METABOLIC PANEL
ANION GAP: 6 (ref 5–15)
BUN: 29 mg/dL — ABNORMAL HIGH (ref 6–20)
CALCIUM: 8.5 mg/dL — AB (ref 8.9–10.3)
CHLORIDE: 116 mmol/L — AB (ref 101–111)
CO2: 22 mmol/L (ref 22–32)
Creatinine, Ser: 1.92 mg/dL — ABNORMAL HIGH (ref 0.61–1.24)
GFR calc Af Amer: 35 mL/min — ABNORMAL LOW (ref >60)
GFR calc non Af Amer: 30 mL/min — ABNORMAL LOW (ref >60)
GLUCOSE: 76 mg/dL (ref 65–99)
Potassium: 3.1 mmol/L — ABNORMAL LOW (ref 3.5–5.1)
Sodium: 144 mmol/L (ref 135–145)

## 2017-08-25 LAB — GLUCOSE, CAPILLARY
Glucose-Capillary: 73 mg/dL (ref 65–99)
Glucose-Capillary: 118 mg/dL — ABNORMAL HIGH (ref 65–99)

## 2017-08-25 LAB — TROPONIN I

## 2017-08-25 LAB — LIPASE, BLOOD: Lipase: 34 U/L (ref 11–51)

## 2017-08-25 MED ORDER — GLIPIZIDE ER 2.5 MG PO TB24: 2.5000 mg | ORAL_TABLET | Freq: Two times a day (BID) | ORAL | 1 refills | Status: DC

## 2017-08-25 MED ORDER — DEXTROSE 5 % IN LACTATED RINGERS IV BOLUS
500.0000 mL | Freq: Once | INTRAVENOUS | Status: AC
  Administered 2017-08-25: 500 mL via INTRAVENOUS
  Filled 2017-08-25: qty 500

## 2017-08-25 MED ORDER — CARVEDILOL 25 MG PO TABS
25.0000 mg | ORAL_TABLET | Freq: Once | ORAL | Status: AC
  Administered 2017-08-25: 25 mg via ORAL
  Filled 2017-08-25: qty 1

## 2017-08-25 MED ORDER — AMIODARONE HCL 200 MG PO TABS
200.0000 mg | ORAL_TABLET | Freq: Once | ORAL | Status: AC
  Administered 2017-08-25: 200 mg via ORAL
  Filled 2017-08-25: qty 1

## 2017-08-25 MED ORDER — CLOPIDOGREL BISULFATE 75 MG PO TABS
75.0000 mg | ORAL_TABLET | Freq: Once | ORAL | Status: AC
  Administered 2017-08-25: 75 mg via ORAL
  Filled 2017-08-25: qty 1

## 2017-08-25 MED ORDER — SODIUM CHLORIDE 0.9 % IV SOLN
Freq: Once | INTRAVENOUS | Status: AC
  Administered 2017-08-25: 09:00:00 via INTRAVENOUS

## 2017-08-25 NOTE — ED Notes (Signed)
Pt given 2 cups of orange juice. No trays available at this time

## 2017-08-25 NOTE — ED Triage Notes (Addendum)
Pt presents to ED via [facility] from home c/o hypoglycemia. EMS report initial CBG 34. Pt given x1 D50 and oral glucose, repeat CBG 132. On Glipizide.

## 2017-08-25 NOTE — ED Provider Notes (Signed)
[facility] Emergency Department Provider Note       Time seen: ----------------------------------------- 7:18 AM on 08/25/2017 -----------------------------------------   I have reviewed the triage vital signs and the nursing notes.  HISTORY   Chief Complaint Hypoglycemia    HPI Matthew [name] is a 84 y.o. male with a history of anemia, CHF, chronic kidney disease, coronary artery disease, diabetes, hypertension who presents to the ED for hypoglycemia.  EMS reported initial blood glucose of 34.  Patient was given an amp of D50 and oral glucose with a repeat of 132.  Patient does take glipizide.  Patient reports vomiting and diarrhea over the past several days that seemed to resolve yesterday.  He states he ate normally last night.  He has not had significant hypoglycemia in the past.  He denies fevers, chills or other complaints.  Past Medical History:  Diagnosis Date  . Anemia   . Arthritis   . Broken arm 05/2013   Left  . CHF (congestive heart failure) ([place])   . Chronic kidney disease   . Coronary artery disease   . Diabetes mellitus without complication ([place])   . Dyspnea    DOE  . Edema    FEET/LEGS OCCAS  . Hypertension   . Myocardial infarction [facility])     Patient Active Problem List   Diagnosis Date Noted  . CAD (coronary artery disease) of bypass graft 12/04/2013  . Unstable angina; Class III Angina 12/03/2013  . Diabetes mellitus type 2, uncomplicated ([place]) 19/14/7829  . HLD (hyperlipidemia) 12/03/2013  . HTN (hypertension) 12/03/2013    Past Surgical History:  Procedure Laterality Date  . CATARACT EXTRACTION W/PHACO Left 07/26/2017   Procedure: CATARACT EXTRACTION PHACO AND INTRAOCULAR LENS PLACEMENT (I[facility]);  Surgeon: Birder Robson, MD;  Location: [facility] ORS;  Service: Ophthalmology;  Laterality: Left;  [place]   00:45.8 AP%  13.1 CDE  6.00 Fluid Pack Lot # [id number]  . CATARACT EXTRACTION W/PHACO Right 08/17/2017   Procedure: CATARACT  EXTRACTION PHACO AND INTRAOCULAR LENS PLACEMENT (I[facility]);  Surgeon: Birder Robson, MD;  Location: [facility] ORS;  Service: Ophthalmology;  Laterality: Right;  [place]  01:30 AP% 16.8 CDE 15.24 Fluid pack lot # [id number]  . CHOLECYSTECTOMY    . CORONARY ANGIOPLASTY     STENTS  . CORONARY ARTERY BYPASS GRAFT    . FRACTURE SURGERY     ARM  . LEFT HEART CATHETERIZATION WITH CORONARY/GRAFT ANGIOGRAM Left 12/04/2013   Procedure: LEFT HEART CATHETERIZATION WITH Beatrix Fetters;  Surgeon: Leonie Man, MD;  Location: [facility] CATH LAB;  Service: Cardiovascular;  Laterality: Left;  . PERCUTANEOUS CORONARY STENT INTERVENTION (PCI-S) N/A 12/06/2013   Procedure: PERCUTANEOUS CORONARY STENT INTERVENTION (PCI-S);  Surgeon: Sinclair Grooms, MD;  Location: [facility] CATH LAB;  Service: Cardiovascular;  Laterality: N/A;  . VEIN BYPASS SURGERY     [place] Bypass Surgery     Allergies Statins  Social History Social History   Tobacco Use  . Smoking status: Former Smoker    Types: Cigarettes  . Smokeless tobacco: Never Used  . Tobacco comment: quit 30 yrs ago, smoked 14-15 yrs, mainly pipe  Substance Use Topics  . Alcohol use: Yes    Alcohol/week: 0.6 oz    Types: 1 Glasses of wine per week    Comment: occasionally drinking only  . Drug use: No   Review of Systems Constitutional: Negative for fever. Cardiovascular: Negative for chest pain. Respiratory: Negative for shortness of breath. Gastrointestinal: Negative for abdominal pain, vomiting and diarrhea.  Musculoskeletal: Negative for back pain. Skin: Negative for rash. Neurological: Negative for headaches, focal weakness or numbness.  All systems negative/normal/unremarkable except as stated in the HPI  ____________________________________________   PHYSICAL EXAM:  VITAL SIGNS: ED Triage Vitals [08/25/17 0716]  Enc Vitals Group     BP      Pulse      Resp      Temp      Temp src      SpO2      Weight 220 lb (99.8 kg)     Height 6'  (1.829 m)     Head Circumference      Peak Flow      Pain Score 0     Pain Loc      Pain Edu?      Excl. in [place]?    Constitutional: Alert and oriented. Well appearing and in no distress. Eyes: Conjunctivae are normal. Normal extraocular movements. ENT   Head: Normocephalic and atraumatic.   Nose: No congestion/rhinnorhea.   Mouth/Throat: Mucous membranes are moist.   Neck: No stridor. Cardiovascular: Normal rate, regular rhythm. No murmurs, rubs, or gallops. Respiratory: Normal respiratory effort without tachypnea nor retractions. Breath sounds are clear and equal bilaterally. No wheezes/rales/rhonchi. Gastrointestinal: Soft and nontender. Normal bowel sounds Musculoskeletal: Nontender with normal range of motion in extremities. No lower extremity tenderness nor edema. Neurologic:  Normal speech and language. No gross focal neurologic deficits are appreciated.  Skin:  Skin is warm, dry and intact. No rash noted. Psychiatric: Mood and affect are normal. Speech and behavior are normal.  ____________________________________________  EKG: Interpreted by me.  Sinus rhythm the rate 89 bpm, short PR interval, left bundle branch block, normal QT  ____________________________________________  ED COURSE:  As part of my medical decision making, I reviewed the following data within the [place] History obtained from family if available, nursing notes, old chart and ekg, as well as notes from prior ED visits. Patient presented for hypoglycemia, we will assess with labs and imaging as indicated at this time.   Procedures ____________________________________________   LABS (pertinent positives/negatives)  Labs Reviewed  CBC WITH DIFFERENTIAL/PLATELET - Abnormal; Notable for the following components:      Result Value   RBC 4.25 (*)    Hemoglobin 12.9 (*)    HCT 39.6 (*)    RDW 16.1 (*)    Lymphs Abs 0.4 (*)    All other components within normal limits   COMPREHENSIVE METABOLIC PANEL - Abnormal; Notable for the following components:   Potassium 3.4 (*)    Chloride 113 (*)    BUN 31 (*)    Creatinine, Ser 2.06 (*)    Calcium 8.8 (*)    GFR calc non Af Amer 28 (*)    GFR calc Af Amer 32 (*)    All other components within normal limits  URINALYSIS, COMPLETE (UACMP) WITH MICROSCOPIC - Abnormal; Notable for the following components:   Color, Urine YELLOW (*)    APPearance HAZY (*)    Glucose, UA 150 (*)    Protein, ur 100 (*)    Bacteria, UA RARE (*)    Squamous Epithelial / LPF 0-5 (*)    All other components within normal limits  GLUCOSE, CAPILLARY - Abnormal; Notable for the following components:   Glucose-Capillary 118 (*)    All other components within normal limits  LIPASE, BLOOD  TROPONIN I  GLUCOSE, CAPILLARY  BASIC METABOLIC PANEL   ____________________________________________  DIFFERENTIAL DIAGNOSIS  Hypoglycemia, dehydration, electrolyte abnormality, medication side effect  FINAL ASSESSMENT AND PLAN  Hypoglycemia, mild renal insufficiency   Plan: The patient had presented for low blood sugar. Patient's labs did indicate mild renal insufficiency compared to prior.  Patient has eaten well here and maintain his blood sugar.  We rechecked a basic metabolic panel after a liter of saline which showed improvement in his lab work.  I will decrease the dose of his glipizide and have encouraged the family that any time that he is sick to hold this medication.  Otherwise he is stable for outpatient follow-up.   Laurence Aly, MD   Note: This note was generated in part or whole with voice recognition software. Voice recognition is usually quite accurate but there are transcription errors that can and very often do occur. I apologize for any typographical errors that were not detected and corrected.     Earleen Newport, MD 08/25/17 [phone]

## 2017-08-25 NOTE — ED Notes (Signed)
Pt given breakfast tray

## 2017-08-31 DIAGNOSIS — M25561 Pain in right knee: Secondary | ICD-10-CM | POA: Diagnosis not present

## 2017-08-31 DIAGNOSIS — M1711 Unilateral primary osteoarthritis, right knee: Secondary | ICD-10-CM | POA: Diagnosis not present

## 2017-09-07 DIAGNOSIS — M1712 Unilateral primary osteoarthritis, left knee: Secondary | ICD-10-CM | POA: Diagnosis not present

## 2017-09-07 DIAGNOSIS — M25562 Pain in left knee: Secondary | ICD-10-CM | POA: Diagnosis not present

## 2017-09-14 DIAGNOSIS — M25561 Pain in right knee: Secondary | ICD-10-CM | POA: Diagnosis not present

## 2017-09-14 DIAGNOSIS — M1711 Unilateral primary osteoarthritis, right knee: Secondary | ICD-10-CM | POA: Diagnosis not present

## 2017-09-15 DIAGNOSIS — I719 Aortic aneurysm of unspecified site, without rupture: Secondary | ICD-10-CM | POA: Diagnosis not present

## 2017-09-15 DIAGNOSIS — E663 Overweight: Secondary | ICD-10-CM | POA: Diagnosis not present

## 2017-09-15 DIAGNOSIS — E785 Hyperlipidemia, unspecified: Secondary | ICD-10-CM | POA: Diagnosis not present

## 2017-09-15 DIAGNOSIS — I10 Essential (primary) hypertension: Secondary | ICD-10-CM | POA: Diagnosis not present

## 2017-09-15 DIAGNOSIS — R55 Syncope and collapse: Secondary | ICD-10-CM | POA: Diagnosis not present

## 2017-09-15 DIAGNOSIS — I2510 Atherosclerotic heart disease of native coronary artery without angina pectoris: Secondary | ICD-10-CM | POA: Diagnosis not present

## 2017-09-15 DIAGNOSIS — I351 Nonrheumatic aortic (valve) insufficiency: Secondary | ICD-10-CM | POA: Diagnosis not present

## 2017-09-19 DIAGNOSIS — I2510 Atherosclerotic heart disease of native coronary artery without angina pectoris: Secondary | ICD-10-CM | POA: Diagnosis not present

## 2017-09-19 DIAGNOSIS — R0602 Shortness of breath: Secondary | ICD-10-CM | POA: Diagnosis not present

## 2017-09-19 DIAGNOSIS — I10 Essential (primary) hypertension: Secondary | ICD-10-CM | POA: Diagnosis not present

## 2017-09-19 DIAGNOSIS — I25810 Atherosclerosis of coronary artery bypass graft(s) without angina pectoris: Secondary | ICD-10-CM | POA: Diagnosis not present

## 2017-09-19 DIAGNOSIS — I739 Peripheral vascular disease, unspecified: Secondary | ICD-10-CM | POA: Diagnosis not present

## 2017-09-19 DIAGNOSIS — E785 Hyperlipidemia, unspecified: Secondary | ICD-10-CM | POA: Diagnosis not present

## 2017-09-20 ENCOUNTER — Ambulatory Visit
Admission: RE | Admit: 2017-09-20 | Discharge: 2017-09-20 | Disposition: A | Discharge status: Home / Self Care | Payer: Medicare Other | Source: Ambulatory Visit | Attending: Unknown Physician Specialty | Admitting: Unknown Physician Specialty

## 2017-09-20 ENCOUNTER — Other Ambulatory Visit: Payer: Self-pay | Admitting: Unknown Physician Specialty

## 2017-09-20 DIAGNOSIS — R05 Cough: Secondary | ICD-10-CM | POA: Insufficient documentation

## 2017-09-20 DIAGNOSIS — R059 Cough, unspecified: Secondary | ICD-10-CM

## 2017-09-20 DIAGNOSIS — J209 Acute bronchitis, unspecified: Secondary | ICD-10-CM | POA: Diagnosis not present

## 2017-09-21 DIAGNOSIS — E1122 Type 2 diabetes mellitus with diabetic chronic kidney disease: Secondary | ICD-10-CM | POA: Diagnosis not present

## 2017-09-21 DIAGNOSIS — E785 Hyperlipidemia, unspecified: Secondary | ICD-10-CM | POA: Diagnosis not present

## 2017-09-21 DIAGNOSIS — I10 Essential (primary) hypertension: Secondary | ICD-10-CM | POA: Diagnosis not present

## 2017-09-21 DIAGNOSIS — E663 Overweight: Secondary | ICD-10-CM | POA: Diagnosis not present

## 2017-09-27 DIAGNOSIS — M545 Low back pain: Secondary | ICD-10-CM | POA: Diagnosis not present

## 2017-09-27 DIAGNOSIS — G4700 Insomnia, unspecified: Secondary | ICD-10-CM | POA: Diagnosis not present

## 2017-09-27 DIAGNOSIS — I351 Nonrheumatic aortic (valve) insufficiency: Secondary | ICD-10-CM | POA: Diagnosis not present

## 2017-09-27 DIAGNOSIS — I340 Nonrheumatic mitral (valve) insufficiency: Secondary | ICD-10-CM | POA: Diagnosis not present

## 2017-09-27 DIAGNOSIS — J309 Allergic rhinitis, unspecified: Secondary | ICD-10-CM | POA: Diagnosis not present

## 2017-09-27 DIAGNOSIS — I4891 Unspecified atrial fibrillation: Secondary | ICD-10-CM | POA: Diagnosis not present

## 2017-09-27 DIAGNOSIS — E1122 Type 2 diabetes mellitus with diabetic chronic kidney disease: Secondary | ICD-10-CM | POA: Diagnosis not present

## 2017-09-27 DIAGNOSIS — I712 Thoracic aortic aneurysm, without rupture: Secondary | ICD-10-CM | POA: Diagnosis not present

## 2017-09-27 DIAGNOSIS — D649 Anemia, unspecified: Secondary | ICD-10-CM | POA: Diagnosis not present

## 2017-09-27 DIAGNOSIS — M13869 Other specified arthritis, unspecified knee: Secondary | ICD-10-CM | POA: Diagnosis not present

## 2017-09-27 DIAGNOSIS — I10 Essential (primary) hypertension: Secondary | ICD-10-CM | POA: Diagnosis not present

## 2017-09-27 DIAGNOSIS — E785 Hyperlipidemia, unspecified: Secondary | ICD-10-CM | POA: Diagnosis not present

## 2017-09-29 DIAGNOSIS — I509 Heart failure, unspecified: Secondary | ICD-10-CM | POA: Diagnosis not present

## 2017-09-29 DIAGNOSIS — R809 Proteinuria, unspecified: Secondary | ICD-10-CM | POA: Diagnosis not present

## 2017-09-29 DIAGNOSIS — E118 Type 2 diabetes mellitus with unspecified complications: Secondary | ICD-10-CM | POA: Diagnosis not present

## 2017-09-29 DIAGNOSIS — N390 Urinary tract infection, site not specified: Secondary | ICD-10-CM | POA: Diagnosis not present

## 2017-09-29 DIAGNOSIS — E785 Hyperlipidemia, unspecified: Secondary | ICD-10-CM | POA: Diagnosis not present

## 2017-10-13 DIAGNOSIS — N183 Chronic kidney disease, stage 3 (moderate): Secondary | ICD-10-CM | POA: Diagnosis not present

## 2017-10-13 DIAGNOSIS — E1122 Type 2 diabetes mellitus with diabetic chronic kidney disease: Secondary | ICD-10-CM | POA: Diagnosis not present

## 2017-10-13 DIAGNOSIS — N2581 Secondary hyperparathyroidism of renal origin: Secondary | ICD-10-CM | POA: Diagnosis not present

## 2017-10-13 DIAGNOSIS — N184 Chronic kidney disease, stage 4 (severe): Secondary | ICD-10-CM | POA: Diagnosis not present

## 2017-10-13 DIAGNOSIS — I10 Essential (primary) hypertension: Secondary | ICD-10-CM | POA: Diagnosis not present

## 2017-10-13 DIAGNOSIS — D631 Anemia in chronic kidney disease: Secondary | ICD-10-CM | POA: Diagnosis not present

## 2017-10-20 DIAGNOSIS — R9431 Abnormal electrocardiogram [ECG] [EKG]: Secondary | ICD-10-CM | POA: Diagnosis not present

## 2017-10-20 DIAGNOSIS — I4891 Unspecified atrial fibrillation: Secondary | ICD-10-CM | POA: Diagnosis not present

## 2017-10-20 DIAGNOSIS — I351 Nonrheumatic aortic (valve) insufficiency: Secondary | ICD-10-CM | POA: Diagnosis not present

## 2017-10-20 DIAGNOSIS — I340 Nonrheumatic mitral (valve) insufficiency: Secondary | ICD-10-CM | POA: Diagnosis not present

## 2017-10-20 DIAGNOSIS — I10 Essential (primary) hypertension: Secondary | ICD-10-CM | POA: Diagnosis not present

## 2017-10-20 DIAGNOSIS — E785 Hyperlipidemia, unspecified: Secondary | ICD-10-CM | POA: Diagnosis not present

## 2017-10-20 DIAGNOSIS — I509 Heart failure, unspecified: Secondary | ICD-10-CM | POA: Diagnosis not present

## 2017-11-07 DIAGNOSIS — G4700 Insomnia, unspecified: Secondary | ICD-10-CM | POA: Diagnosis not present

## 2017-11-07 DIAGNOSIS — J309 Allergic rhinitis, unspecified: Secondary | ICD-10-CM | POA: Diagnosis not present

## 2017-11-07 DIAGNOSIS — E785 Hyperlipidemia, unspecified: Secondary | ICD-10-CM | POA: Diagnosis not present

## 2017-11-07 DIAGNOSIS — M545 Low back pain: Secondary | ICD-10-CM | POA: Diagnosis not present

## 2017-11-07 DIAGNOSIS — I4891 Unspecified atrial fibrillation: Secondary | ICD-10-CM | POA: Diagnosis not present

## 2017-11-07 DIAGNOSIS — I25810 Atherosclerosis of coronary artery bypass graft(s) without angina pectoris: Secondary | ICD-10-CM | POA: Diagnosis not present

## 2017-11-07 DIAGNOSIS — I340 Nonrheumatic mitral (valve) insufficiency: Secondary | ICD-10-CM | POA: Diagnosis not present

## 2017-11-07 DIAGNOSIS — D649 Anemia, unspecified: Secondary | ICD-10-CM | POA: Diagnosis not present

## 2017-11-07 DIAGNOSIS — I2510 Atherosclerotic heart disease of native coronary artery without angina pectoris: Secondary | ICD-10-CM | POA: Diagnosis not present

## 2017-11-07 DIAGNOSIS — I10 Essential (primary) hypertension: Secondary | ICD-10-CM | POA: Diagnosis not present

## 2017-11-07 DIAGNOSIS — M13869 Other specified arthritis, unspecified knee: Secondary | ICD-10-CM | POA: Diagnosis not present

## 2017-11-07 DIAGNOSIS — E1122 Type 2 diabetes mellitus with diabetic chronic kidney disease: Secondary | ICD-10-CM | POA: Diagnosis not present

## 2017-11-15 DIAGNOSIS — I10 Essential (primary) hypertension: Secondary | ICD-10-CM | POA: Diagnosis not present

## 2017-11-15 DIAGNOSIS — I25810 Atherosclerosis of coronary artery bypass graft(s) without angina pectoris: Secondary | ICD-10-CM | POA: Diagnosis not present

## 2017-11-15 DIAGNOSIS — R0602 Shortness of breath: Secondary | ICD-10-CM | POA: Diagnosis not present

## 2017-11-15 DIAGNOSIS — E785 Hyperlipidemia, unspecified: Secondary | ICD-10-CM | POA: Diagnosis not present

## 2017-11-15 DIAGNOSIS — I2510 Atherosclerotic heart disease of native coronary artery without angina pectoris: Secondary | ICD-10-CM | POA: Diagnosis not present

## 2017-11-23 DIAGNOSIS — R079 Chest pain, unspecified: Secondary | ICD-10-CM | POA: Diagnosis not present

## 2017-11-25 DIAGNOSIS — G4737 Central sleep apnea in conditions classified elsewhere: Secondary | ICD-10-CM | POA: Diagnosis not present

## 2017-11-29 DIAGNOSIS — E663 Overweight: Secondary | ICD-10-CM | POA: Diagnosis not present

## 2017-11-29 DIAGNOSIS — J40 Bronchitis, not specified as acute or chronic: Secondary | ICD-10-CM | POA: Diagnosis not present

## 2017-11-29 DIAGNOSIS — I25810 Atherosclerosis of coronary artery bypass graft(s) without angina pectoris: Secondary | ICD-10-CM | POA: Diagnosis not present

## 2017-11-29 DIAGNOSIS — Z9861 Coronary angioplasty status: Secondary | ICD-10-CM | POA: Diagnosis not present

## 2017-11-29 DIAGNOSIS — E785 Hyperlipidemia, unspecified: Secondary | ICD-10-CM | POA: Diagnosis not present

## 2017-11-29 DIAGNOSIS — R0602 Shortness of breath: Secondary | ICD-10-CM | POA: Diagnosis not present

## 2017-11-29 DIAGNOSIS — I10 Essential (primary) hypertension: Secondary | ICD-10-CM | POA: Diagnosis not present

## 2017-11-29 DIAGNOSIS — I2510 Atherosclerotic heart disease of native coronary artery without angina pectoris: Secondary | ICD-10-CM | POA: Diagnosis not present

## 2017-12-01 DIAGNOSIS — J45991 Cough variant asthma: Secondary | ICD-10-CM | POA: Diagnosis not present

## 2017-12-09 ENCOUNTER — Other Ambulatory Visit: Payer: Self-pay

## 2017-12-09 ENCOUNTER — Encounter: Payer: Self-pay | Admitting: *Deleted

## 2017-12-09 ENCOUNTER — Inpatient Hospital Stay
Admission: EM | Admit: 2017-12-09 | Discharge: 2017-12-11 | DRG: 638 | Disposition: A | Discharge status: Home / Self Care | Payer: Medicare Other | Attending: Internal Medicine | Admitting: Internal Medicine

## 2017-12-09 DIAGNOSIS — Z961 Presence of intraocular lens: Secondary | ICD-10-CM | POA: Diagnosis present

## 2017-12-09 DIAGNOSIS — R531 Weakness: Secondary | ICD-10-CM | POA: Diagnosis not present

## 2017-12-09 DIAGNOSIS — N184 Chronic kidney disease, stage 4 (severe): Secondary | ICD-10-CM | POA: Diagnosis present

## 2017-12-09 DIAGNOSIS — Z7902 Long term (current) use of antithrombotics/antiplatelets: Secondary | ICD-10-CM

## 2017-12-09 DIAGNOSIS — Z888 Allergy status to other drugs, medicaments and biological substances status: Secondary | ICD-10-CM

## 2017-12-09 DIAGNOSIS — E11649 Type 2 diabetes mellitus with hypoglycemia without coma: Principal | ICD-10-CM | POA: Diagnosis present

## 2017-12-09 DIAGNOSIS — E785 Hyperlipidemia, unspecified: Secondary | ICD-10-CM | POA: Diagnosis present

## 2017-12-09 DIAGNOSIS — Z87891 Personal history of nicotine dependence: Secondary | ICD-10-CM

## 2017-12-09 DIAGNOSIS — Z7984 Long term (current) use of oral hypoglycemic drugs: Secondary | ICD-10-CM

## 2017-12-09 DIAGNOSIS — I130 Hypertensive heart and chronic kidney disease with heart failure and stage 1 through stage 4 chronic kidney disease, or unspecified chronic kidney disease: Secondary | ICD-10-CM | POA: Diagnosis present

## 2017-12-09 DIAGNOSIS — N179 Acute kidney failure, unspecified: Secondary | ICD-10-CM | POA: Diagnosis not present

## 2017-12-09 DIAGNOSIS — T383X1A Poisoning by insulin and oral hypoglycemic [antidiabetic] drugs, accidental (unintentional), initial encounter: Secondary | ICD-10-CM | POA: Diagnosis not present

## 2017-12-09 DIAGNOSIS — E119 Type 2 diabetes mellitus without complications: Secondary | ICD-10-CM | POA: Diagnosis not present

## 2017-12-09 DIAGNOSIS — Z9842 Cataract extraction status, left eye: Secondary | ICD-10-CM | POA: Diagnosis not present

## 2017-12-09 DIAGNOSIS — R4781 Slurred speech: Secondary | ICD-10-CM | POA: Diagnosis not present

## 2017-12-09 DIAGNOSIS — Z9841 Cataract extraction status, right eye: Secondary | ICD-10-CM

## 2017-12-09 DIAGNOSIS — Z7982 Long term (current) use of aspirin: Secondary | ICD-10-CM | POA: Diagnosis not present

## 2017-12-09 DIAGNOSIS — I5022 Chronic systolic (congestive) heart failure: Secondary | ICD-10-CM | POA: Diagnosis present

## 2017-12-09 DIAGNOSIS — E162 Hypoglycemia, unspecified: Secondary | ICD-10-CM

## 2017-12-09 DIAGNOSIS — E1122 Type 2 diabetes mellitus with diabetic chronic kidney disease: Secondary | ICD-10-CM | POA: Diagnosis present

## 2017-12-09 DIAGNOSIS — R600 Localized edema: Secondary | ICD-10-CM | POA: Diagnosis not present

## 2017-12-09 DIAGNOSIS — I252 Old myocardial infarction: Secondary | ICD-10-CM

## 2017-12-09 DIAGNOSIS — I4891 Unspecified atrial fibrillation: Secondary | ICD-10-CM | POA: Diagnosis present

## 2017-12-09 DIAGNOSIS — Z79899 Other long term (current) drug therapy: Secondary | ICD-10-CM

## 2017-12-09 DIAGNOSIS — R4182 Altered mental status, unspecified: Secondary | ICD-10-CM | POA: Diagnosis not present

## 2017-12-09 LAB — URINALYSIS, COMPLETE (UACMP) WITH MICROSCOPIC
Bacteria, UA: NONE SEEN
Bilirubin Urine: NEGATIVE
Glucose, UA: NEGATIVE mg/dL
Hgb urine dipstick: NEGATIVE
KETONES UR: NEGATIVE mg/dL
LEUKOCYTES UA: NEGATIVE
Nitrite: NEGATIVE
PH: 5 (ref 5.0–8.0)
PROTEIN: NEGATIVE mg/dL
Specific Gravity, Urine: 1.009 (ref 1.005–1.030)

## 2017-12-09 LAB — CBC WITH DIFFERENTIAL/PLATELET
Basophils Absolute: 0 10*3/uL (ref 0–0.1)
Basophils Relative: 0 %
EOS PCT: 3 %
Eosinophils Absolute: 0.2 10*3/uL (ref 0–0.7)
HEMATOCRIT: 35.6 % — AB (ref 40.0–52.0)
Hemoglobin: 11.8 g/dL — ABNORMAL LOW (ref 13.0–18.0)
LYMPHS PCT: 8 %
Lymphs Abs: 0.6 10*3/uL — ABNORMAL LOW (ref 1.0–3.6)
MCH: 30 pg (ref 26.0–34.0)
MCHC: 33.2 g/dL (ref 32.0–36.0)
MCV: 90.3 fL (ref 80.0–100.0)
MONO ABS: 0.7 10*3/uL (ref 0.2–1.0)
MONOS PCT: 10 %
NEUTROS ABS: 5.7 10*3/uL (ref 1.4–6.5)
Neutrophils Relative %: 79 %
PLATELETS: 110 10*3/uL — AB (ref 150–440)
RBC: 3.94 MIL/uL — ABNORMAL LOW (ref 4.40–5.90)
RDW: 17.1 % — ABNORMAL HIGH (ref 11.5–14.5)
WBC: 7.3 10*3/uL (ref 3.8–10.6)

## 2017-12-09 LAB — COMPREHENSIVE METABOLIC PANEL
ALT: 21 U/L (ref 0–44)
ANION GAP: 8 (ref 5–15)
AST: 27 U/L (ref 15–41)
Albumin: 3.3 g/dL — ABNORMAL LOW (ref 3.5–5.0)
Alkaline Phosphatase: 50 U/L (ref 38–126)
BILIRUBIN TOTAL: 1.6 mg/dL — AB (ref 0.3–1.2)
BUN: 56 mg/dL — AB (ref 8–23)
CHLORIDE: 101 mmol/L (ref 98–111)
CO2: 30 mmol/L (ref 22–32)
Calcium: 8.7 mg/dL — ABNORMAL LOW (ref 8.9–10.3)
Creatinine, Ser: 2.32 mg/dL — ABNORMAL HIGH (ref 0.61–1.24)
GFR, EST AFRICAN AMERICAN: 28 mL/min — AB (ref >60)
GFR, EST NON AFRICAN AMERICAN: 24 mL/min — AB (ref >60)
Glucose, Bld: 112 mg/dL — ABNORMAL HIGH (ref 70–99)
POTASSIUM: 3.3 mmol/L — AB (ref 3.5–5.1)
Sodium: 139 mmol/L (ref 135–145)
TOTAL PROTEIN: 6.1 g/dL — AB (ref 6.5–8.1)

## 2017-12-09 LAB — GLUCOSE, CAPILLARY
GLUCOSE-CAPILLARY: 109 mg/dL — AB (ref 70–99)
GLUCOSE-CAPILLARY: 31 mg/dL — AB (ref 70–99)
GLUCOSE-CAPILLARY: 104 mg/dL — AB (ref 70–99)
GLUCOSE-CAPILLARY: 80 mg/dL (ref 70–99)
GLUCOSE-CAPILLARY: 112 mg/dL — AB (ref 70–99)
GLUCOSE-CAPILLARY: 115 mg/dL — AB (ref 70–99)
Glucose-Capillary: 85 mg/dL (ref 70–99)

## 2017-12-09 LAB — TROPONIN I

## 2017-12-09 LAB — HEMOGLOBIN A1C
Hgb A1c MFr Bld: 5.8 % — ABNORMAL HIGH (ref 4.8–5.6)
Mean Plasma Glucose: 119.76 mg/dL

## 2017-12-09 MED ORDER — CARVEDILOL 25 MG PO TABS
25.0000 mg | ORAL_TABLET | Freq: Two times a day (BID) | ORAL | Status: DC
  Administered 2017-12-10 – 2017-12-11 (×3): 25 mg via ORAL
  Filled 2017-12-09 (×3): qty 1

## 2017-12-09 MED ORDER — DEXTROSE 50 % IV SOLN
25.0000 g | Freq: Once | INTRAVENOUS | Status: AC
  Administered 2017-12-09: 25 g via INTRAVENOUS

## 2017-12-09 MED ORDER — VITAMIN D3 25 MCG (1000 UNIT) PO TABS
2000.0000 [IU] | ORAL_TABLET | Freq: Every day | ORAL | Status: DC
  Administered 2017-12-10 – 2017-12-11 (×2): 2000 [IU] via ORAL
  Filled 2017-12-09 (×4): qty 2

## 2017-12-09 MED ORDER — DEXTROSE 50 % IV SOLN
INTRAVENOUS | Status: AC
  Filled 2017-12-09: qty 50

## 2017-12-09 MED ORDER — ACETAMINOPHEN 325 MG PO TABS: 650.0000 mg | ORAL_TABLET | Freq: Four times a day (QID) | ORAL | Status: DC | PRN

## 2017-12-09 MED ORDER — AMIODARONE HCL 200 MG PO TABS
200.0000 mg | ORAL_TABLET | Freq: Every day | ORAL | Status: DC
  Administered 2017-12-10 – 2017-12-11 (×2): 200 mg via ORAL
  Filled 2017-12-09 (×2): qty 1

## 2017-12-09 MED ORDER — ATORVASTATIN CALCIUM 20 MG PO TABS
40.0000 mg | ORAL_TABLET | Freq: Every day | ORAL | Status: DC
  Administered 2017-12-09 – 2017-12-10 (×2): 40 mg via ORAL
  Filled 2017-12-09 (×2): qty 2

## 2017-12-09 MED ORDER — ACETAMINOPHEN 650 MG RE SUPP: 650.0000 mg | Freq: Four times a day (QID) | RECTAL | Status: DC | PRN

## 2017-12-09 MED ORDER — FENOFIBRATE 160 MG PO TABS
160.0000 mg | ORAL_TABLET | Freq: Every day | ORAL | Status: DC
  Administered 2017-12-10 – 2017-12-11 (×2): 160 mg via ORAL
  Filled 2017-12-09 (×2): qty 1

## 2017-12-09 MED ORDER — SODIUM CHLORIDE 0.9 % IV BOLUS
250.0000 mL | Freq: Once | INTRAVENOUS | Status: AC
  Administered 2017-12-09: 250 mL via INTRAVENOUS

## 2017-12-09 MED ORDER — ASPIRIN EC 81 MG PO TBEC
81.0000 mg | DELAYED_RELEASE_TABLET | Freq: Every day | ORAL | Status: DC
  Administered 2017-12-10: 81 mg via ORAL
  Filled 2017-12-09: qty 1

## 2017-12-09 MED ORDER — NITROGLYCERIN 0.4 MG SL SUBL: 0.4000 mg | SUBLINGUAL_TABLET | SUBLINGUAL | Status: DC | PRN

## 2017-12-09 MED ORDER — FUROSEMIDE 40 MG PO TABS: 40.0000 mg | ORAL_TABLET | Freq: Two times a day (BID) | ORAL | Status: DC

## 2017-12-09 MED ORDER — ADULT MULTIVITAMIN W/MINERALS CH
1.0000 | ORAL_TABLET | Freq: Every day | ORAL | Status: DC
  Administered 2017-12-10 – 2017-12-11 (×2): 1 via ORAL
  Filled 2017-12-09 (×2): qty 1

## 2017-12-09 MED ORDER — ENOXAPARIN SODIUM 30 MG/0.3ML ~~LOC~~ SOLN: 30.0000 mg | SUBCUTANEOUS | Status: DC

## 2017-12-09 MED ORDER — ZOLPIDEM TARTRATE 5 MG PO TABS: 5.0000 mg | ORAL_TABLET | Freq: Every evening | ORAL | Status: DC | PRN

## 2017-12-09 MED ORDER — DEXTROSE 10 % IV SOLN
INTRAVENOUS | Status: DC
  Administered 2017-12-09: 18:00:00 via INTRAVENOUS

## 2017-12-09 MED ORDER — OMEGA-3-ACID ETHYL ESTERS 1 G PO CAPS
4000.0000 mg | ORAL_CAPSULE | Freq: Every day | ORAL | Status: DC
  Administered 2017-12-10: 4000 mg via ORAL
  Filled 2017-12-09: qty 4

## 2017-12-09 MED ORDER — DEXTROSE-NACL 2.5-0.45 % IV SOLN
INTRAVENOUS | Status: DC
  Filled 2017-12-09: qty 1000

## 2017-12-09 MED ORDER — FLUTICASONE PROPIONATE 50 MCG/ACT NA SUSP
2.0000 | Freq: Every day | NASAL | Status: DC
  Administered 2017-12-09 – 2017-12-10 (×2): 2 via NASAL
  Filled 2017-12-09: qty 16

## 2017-12-09 MED ORDER — CLOPIDOGREL BISULFATE 75 MG PO TABS
75.0000 mg | ORAL_TABLET | Freq: Every day | ORAL | Status: DC
  Administered 2017-12-10 – 2017-12-11 (×2): 75 mg via ORAL
  Filled 2017-12-09 (×2): qty 1

## 2017-12-09 NOTE — Progress Notes (Signed)
PHARMACIST - PHYSICIAN COMMUNICATION  CONCERNING:  Enoxaparin (Lovenox) for DVT Prophylaxis    RECOMMENDATION: Patient was prescribed enoxaprin 40mg  q24 hours for VTE prophylaxis.   Filed Weights   12/09/17 1335  Weight: 219 lb (99.3 kg)    Body mass index is 29.7 kg/m.  Estimated Creatinine Clearance: 28.9 mL/min (A) (by C-G formula based on SCr of 2.32 mg/dL (H)).   Patient is candidate for enoxaparin 30mg  every 24 hours based on CrCl <44ml/min.  DESCRIPTION: Pharmacy has adjusted enoxaparin dose per [place], approved through [place] committee.    Patient is now receiving enoxaparin 30mg  every 24 hours.  Tawnya Crook, PharmD Pharmacy Resident  12/09/2017 7:03 PM

## 2017-12-09 NOTE — ED Notes (Signed)
Patient given orange juice and an ED sandwich tray.

## 2017-12-09 NOTE — ED Notes (Signed)
Patient was given orange juice by Dr. Jimmye Norman. Skin tear on left elbow was cleaned with NS and covered with a Tegaderm.

## 2017-12-09 NOTE — Progress Notes (Signed)
Patient ID: Matthew [name], male   DOB: 04-27-1931, 84 y.o.   MRN: [id number]  ACP note Diagnosis: Hypoglycemia with sulfonylurea, type 2 diabetes mellitus, chronic systolic congestive heart failure, hypertension, lower extremity edema, History of CAD   CODE STATUS discussed.  Patient wants to be a full code. Plan.  Stop Glipizide.  D10 drip.  Monitor sugars every 2 hours.  Sulfonylurea can cause prolonged hypoglycemia.   Patient and daughter at the bedside   time spent on ACP discussion 17 minutes Dr. Loletha Grayer

## 2017-12-09 NOTE — H&P (Signed)
[facility] [place] NAME: Matthew [name]    MR#:  [id number]  DATE OF BIRTH:  April 27, 1931  DATE OF ADMISSION:  12/09/2017  PRIMARY CARE PHYSICIAN: Jodi Marble, MD   REQUESTING/REFERRING PHYSICIAN: Dr Cephas Darby  CHIEF COMPLAINT:   Chief Complaint  Patient presents with  . Hypoglycemia    HISTORY OF PRESENT ILLNESS:  Matthew [name]  is a 84 y.o. male with a known history of diabetes.  He was out shopping and had an episode where he almost fell over and his knee buckled.  He did not fall and they sat him down in a wheelchair.  His daughter came in and his speech was slurred at that time.  He was sweating profusely.  When EMS arrived they found out that he was a diabetic and they took his fingerstick and his sugar was 40.  They gave him some glucose and it came up to 109.  In the ER his blood sugars dropped down into the 30s again.  Hospitalist services were contacted for further evaluation.  Patient currently is not slurring his speech and feels okay.  He takes glipizide at home.  PAST MEDICAL HISTORY:   Past Medical History:  Diagnosis Date  . Anemia   . Arthritis   . Broken arm 05/2013   Left  . CHF (congestive heart failure) ([place])   . Chronic kidney disease   . Coronary artery disease   . Diabetes mellitus without complication ([place])   . Dyspnea    DOE  . Edema    FEET/LEGS OCCAS  . Hypertension   . Myocardial infarction [facility])     PAST SURGICAL HISTORY:   Past Surgical History:  Procedure Laterality Date  . CATARACT EXTRACTION W/PHACO Left 07/26/2017   Procedure: CATARACT EXTRACTION PHACO AND INTRAOCULAR LENS PLACEMENT (IOC);  Surgeon: Birder Robson, MD;  Location: [facility] ORS;  Service: Ophthalmology;  Laterality: Left;  [place]   00:45.8 AP%  13.1 CDE  6.00 Fluid Pack Lot # [id number]  . CATARACT EXTRACTION W/PHACO Right 08/17/2017   Procedure: CATARACT EXTRACTION PHACO AND INTRAOCULAR LENS PLACEMENT (IOC);   Surgeon: Birder Robson, MD;  Location: [facility] ORS;  Service: Ophthalmology;  Laterality: Right;  [place]  01:30 AP% 16.8 CDE 15.24 Fluid pack lot # [id number]  . CHOLECYSTECTOMY    . CORONARY ANGIOPLASTY     STENTS  . CORONARY ARTERY BYPASS GRAFT    . FRACTURE SURGERY     ARM  . LEFT HEART CATHETERIZATION WITH CORONARY/GRAFT ANGIOGRAM Left 12/04/2013   Procedure: LEFT HEART CATHETERIZATION WITH Beatrix Fetters;  Surgeon: Leonie Man, MD;  Location: [facility] CATH LAB;  Service: Cardiovascular;  Laterality: Left;  . PERCUTANEOUS CORONARY STENT INTERVENTION (PCI-S) N/A 12/06/2013   Procedure: PERCUTANEOUS CORONARY STENT INTERVENTION (PCI-S);  Surgeon: Sinclair Grooms, MD;  Location: [facility] CATH LAB;  Service: Cardiovascular;  Laterality: N/A;  . VEIN BYPASS SURGERY     [place] Bypass Surgery     SOCIAL HISTORY:   Social History   Tobacco Use  . Smoking status: Former Smoker    Types: Cigarettes  . Smokeless tobacco: Never Used  . Tobacco comment: quit 30 yrs ago, smoked 14-15 yrs, mainly pipe  Substance Use Topics  . Alcohol use: Yes    Alcohol/week: 0.6 oz    Types: 1 Glasses of wine per week    Comment: occasionally drinking only    FAMILY HISTORY:   Family History  Problem Relation Age of  Onset  . Heart attack Father 1       died first MI at age 24    DRUG ALLERGIES:   Allergies  Allergen Reactions  . Statins     REVIEW OF SYSTEMS:  CONSTITUTIONAL: No fever, chills.  Positive for sweating episode with this episode today.  Positive for fatigue. EYES: No blurred or double vision.  EARS, NOSE, AND THROAT: No tinnitus or ear pain. No sore throat.  Positive for runny nose.  Positive for hoarse voice. RESPIRATORY: Positive for cough and shortness of breath, no wheezing or hemoptysis.  CARDIOVASCULAR: No chest pain, orthopnea, edema.  GASTROINTESTINAL: No nausea, vomiting, diarrhea or abdominal pain. No blood in bowel movements GENITOURINARY: No dysuria, hematuria.   ENDOCRINE: No polyuria, nocturia,  HEMATOLOGY: No anemia,  positive for easy bruising. SKIN: No rash or lesion. MUSCULOSKELETAL: positive for knee pain NEUROLOGIC: No tingling, numbness, weakness.  PSYCHIATRY: No anxiety or depression.   MEDICATIONS AT HOME:   Prior to Admission medications   Medication Sig Start Date End Date Taking? Authorizing Provider  amiodarone (PACERONE) 200 MG tablet Take 200 mg by mouth daily.    [provider]  aspirin EC 81 MG tablet Take 81 mg by mouth at bedtime.    [provider]  atorvastatin (LIPITOR) 40 MG tablet Take 40 mg by mouth at bedtime.    [provider]  carvedilol (COREG) 25 MG tablet Take 25 mg by mouth 2 (two) times daily with a meal.    [provider]  Cholecalciferol (VITAMIN D3) 2000 UNITS TABS Take 2,000 Units by mouth daily with breakfast.    [provider]  clopidogrel (PLAVIX) 75 MG tablet Take 1 tablet (75 mg total) by mouth daily with breakfast. 12/07/13   Almyra Deforest, PA  fenofibrate 160 MG tablet Take 160 mg by mouth daily.    [provider]  FLOVENT HFA 110 MCG/ACT inhaler Inhale 2 puffs into the lungs daily. 12/01/17   [provider]  fluticasone (FLONASE) 50 MCG/ACT nasal spray Place 2 sprays into both nostrils at bedtime.    [provider]  furosemide (LASIX) 20 MG tablet Take 20 mg by mouth daily.    [provider]  glipiZIDE (GLUCOTROL XL) 2.5 MG 24 hr tablet Take 1 tablet (2.5 mg total) by mouth 2 (two) times daily. 08/25/17   Earleen Newport, MD  losartan (COZAAR) 100 MG tablet Take 1 tablet by mouth daily. 11/10/17   [provider]  Multiple Vitamins-Minerals (CENTRUM SILVER ADULT 50+) TABS Take 1 tablet by mouth daily with breakfast.    [provider]  nitroGLYCERIN (NITROSTAT) 0.4 MG SL tablet Place 0.4 mg under the tongue every 5 (five) minutes as needed for chest pain.    [provider]  Omega-3 Fatty  Acids (FISH OIL) 1000 MG CAPS Take 4,000 mg by mouth daily at 12 noon.     [provider]  PROAIR HFA [phone] Base) MCG/ACT inhaler Inhale 2 puffs into the lungs every 6 (six) hours as needed. 09/20/17   [provider]  zolpidem (AMBIEN) 5 MG tablet Take 5 mg by mouth at bedtime as needed for sleep. 06/24/17   [provider]      VITAL SIGNS:  Blood pressure (!) 111/57, pulse 81, temperature (!) 97.1 F (36.2 C), resp. rate 18, height 6' (1.829 m), weight 99.3 kg (219 lb), SpO2 98 %.  PHYSICAL EXAMINATION:  GENERAL:  84 y.o.-year-old patient lying in the bed  with no acute distress.  EYES: Pupils equal, round, reactive to light and accommodation. No scleral icterus. Extraocular muscles intact.  HEENT: Head atraumatic, normocephalic. Oropharynx and nasopharynx clear.  NECK:  Supple, no jugular venous distention. No thyroid enlargement, no tenderness.  LUNGS: Normal breath sounds bilaterally, no wheezing, rales,rhonchi or crepitation. No use of accessory muscles of respiration.  CARDIOVASCULAR: S1, S2 normal. No murmurs, rubs, or gallops.  ABDOMEN: Soft, nontender, nondistended. Bowel sounds present. No organomegaly or mass.  EXTREMITIES: 3+ pedal edema, no cyanosis, or clubbing.  NEUROLOGIC: Cranial nerves II through XII are intact. Muscle strength 5/5 in all extremities. Sensation intact. Gait not checked.  PSYCHIATRIC: The patient is alert and oriented x 3.  SKIN:   Positive for skin tear left arm.  Positive for bruising upper and lower extremities.  LABORATORY PANEL:   CBC Recent Labs  Lab 12/09/17 1340  WBC 7.3  HGB 11.8*  HCT 35.6*  PLT 110*   ------------------------------------------------------------------------------------------------------------------  Chemistries  Recent Labs  Lab 12/09/17 1340  NA 139  K 3.3*  CL 101  CO2 30  GLUCOSE 112*  BUN 56*  CREATININE 2.32*  CALCIUM 8.7*  AST 27  ALT 21  ALKPHOS 50  BILITOT 1.6*    ------------------------------------------------------------------------------------------------------------------  Cardiac Enzymes Recent Labs  Lab 12/09/17 1340  TROPONINI <0.03   ------------------------------------------------------------------------------------------------------------------   IMPRESSION AND PLAN:   1.  Severe hypoglycemia with sulfonylurea.  Type 2 diabetes mellitus.   Start D10 drip at low rate and check fingersticks every 2 hours.  Stop glipizide.  Add on hemoglobin A1c.  Continue to monitor closely. 2.  History of systolic congestive heart failure.  Family states he has been taking the Lasix 4 times a day.  I would rather do twice a day dosing.  No signs of congestive heart failure now. 3.  Acute on chronic kidney disease.  Monitor closely with IV fluids. 4.  Weakness.  Physical therapy evaluation 5.  History of coronary artery disease on aspirin and Plavix 6.  Lower extremity edema and anasarca.  Continue to monitor with Lasix.   All the records are reviewed and case discussed with ED provider. Management plans discussed with the patient, family and they are in agreement.  CODE STATUS: Full code  TOTAL TIME TAKING CARE OF THIS PATIENT: 50 minutes.    Loletha Grayer M.D on 12/09/2017 at 5:41 PM  Between 7am to 6pm - Pager - [phone]  After 6pm call admission pager [phone]  [employer] Office  [phone]  CC: Primary care physician; Jodi Marble, MD

## 2017-12-09 NOTE — ED Triage Notes (Signed)
Per EMS report, Patient's knee gave out and bystanders caughter him before he hit the ground. Patient is on blood thinners. EMS states patient's blood sugar was initially 45, was given 15g oral glucose which raised it to 50 and then was raised to 150 with D10 infusing. Patient arrived alert and oriented with a skin tear to the left arm.

## 2017-12-09 NOTE — ED Provider Notes (Signed)
[facility] Emergency Department Provider Note       Time seen: ----------------------------------------- 1:32 PM on 12/09/2017 -----------------------------------------   I have reviewed the triage vital signs and the nursing notes.  HISTORY   Chief Complaint Hypoglycemia    HPI Matthew [name] is a 84 y.o. male with a history of anemia, arthritis, CHF, chronic kidney disease, diabetes, edema, hypertension, MI who presents to the ED for a fall.  Patient's knee reportedly gave out and the bystanders caught him before he hit the ground.  He is on blood thinners, EMS states patient's blood sugar was initially 45.  He was given 15 g oral glucose and this raised it to 50, and then was raised 250 with D10 infusing.  Patient arrives alert and oriented with a skin tear noted to his left elbow.  He denies any recent illness, change in his medicines or other complaints.  Past Medical History:  Diagnosis Date  . Anemia   . Arthritis   . Broken arm 05/2013   Left  . CHF (congestive heart failure) ([place])   . Chronic kidney disease   . Coronary artery disease   . Diabetes mellitus without complication ([place])   . Dyspnea    DOE  . Edema    FEET/LEGS OCCAS  . Hypertension   . Myocardial infarction [facility])     Patient Active Problem List   Diagnosis Date Noted  . CAD (coronary artery disease) of bypass graft 12/04/2013  . Unstable angina; Class III Angina 12/03/2013  . Diabetes mellitus type 2, uncomplicated ([place]) 01/75/1025  . HLD (hyperlipidemia) 12/03/2013  . HTN (hypertension) 12/03/2013    Past Surgical History:  Procedure Laterality Date  . CATARACT EXTRACTION W/PHACO Left 07/26/2017   Procedure: CATARACT EXTRACTION PHACO AND INTRAOCULAR LENS PLACEMENT (IOC);  Surgeon: Birder Robson, MD;  Location: [facility] ORS;  Service: Ophthalmology;  Laterality: Left;  [place]   00:45.8 AP%  13.1 CDE  6.00 Fluid Pack Lot # [id number]  . CATARACT EXTRACTION W/PHACO Right  08/17/2017   Procedure: CATARACT EXTRACTION PHACO AND INTRAOCULAR LENS PLACEMENT (IOC);  Surgeon: Birder Robson, MD;  Location: [facility] ORS;  Service: Ophthalmology;  Laterality: Right;  [place]  01:30 AP% 16.8 CDE 15.24 Fluid pack lot # [id number]  . CHOLECYSTECTOMY    . CORONARY ANGIOPLASTY     STENTS  . CORONARY ARTERY BYPASS GRAFT    . FRACTURE SURGERY     ARM  . LEFT HEART CATHETERIZATION WITH CORONARY/GRAFT ANGIOGRAM Left 12/04/2013   Procedure: LEFT HEART CATHETERIZATION WITH Beatrix Fetters;  Surgeon: Leonie Man, MD;  Location: [facility] CATH LAB;  Service: Cardiovascular;  Laterality: Left;  . PERCUTANEOUS CORONARY STENT INTERVENTION (PCI-S) N/A 12/06/2013   Procedure: PERCUTANEOUS CORONARY STENT INTERVENTION (PCI-S);  Surgeon: Sinclair Grooms, MD;  Location: [facility] CATH LAB;  Service: Cardiovascular;  Laterality: N/A;  . VEIN BYPASS SURGERY     [place] Bypass Surgery     Allergies Statins  Social History Social History   Tobacco Use  . Smoking status: Former Smoker    Types: Cigarettes  . Smokeless tobacco: Never Used  . Tobacco comment: quit 30 yrs ago, smoked 14-15 yrs, mainly pipe  Substance Use Topics  . Alcohol use: Yes    Alcohol/week: 0.6 oz    Types: 1 Glasses of wine per week    Comment: occasionally drinking only  . Drug use: No   Review of Systems Constitutional: Negative for fever. Cardiovascular: Negative for chest pain. Respiratory: Negative for shortness  of breath. Gastrointestinal: Negative for abdominal pain, vomiting and diarrhea. Musculoskeletal: Negative for back pain. Skin: Positive for skin tear Neurological: Positive for recent weakness  All systems negative/normal/unremarkable except as stated in the HPI  ____________________________________________   PHYSICAL EXAM:  VITAL SIGNS: ED Triage Vitals  Enc Vitals Group     BP      Pulse      Resp      Temp      Temp src      SpO2      Weight      Height      Head  Circumference      Peak Flow      Pain Score      Pain Loc      Pain Edu?      Excl. in [place]?    Constitutional: Alert and oriented.  Chronically ill-appearing, no distress Eyes: Conjunctivae are normal. Normal extraocular movements. ENT   Head: Normocephalic and atraumatic.   Nose: No congestion/rhinnorhea.   Mouth/Throat: Mucous membranes are moist.   Neck: No stridor. Cardiovascular: Normal rate, regular rhythm. No murmurs, rubs, or gallops. Respiratory: Normal respiratory effort without tachypnea nor retractions. Breath sounds are clear and equal bilaterally. No wheezes/rales/rhonchi. Gastrointestinal: Soft and nontender. Normal bowel sounds Musculoskeletal: Nontender with normal range of motion in extremities.  Bilateral edema is noted Neurologic:  Normal speech and language. No gross focal neurologic deficits are appreciated.  Skin:  Skin is warm, dry with skin tear noted to the left elbow Psychiatric: Mood and affect are normal. Speech and behavior are normal.  ____________________________________________  ED COURSE:  As part of my medical decision making, I reviewed the following data within the [place] History obtained from family if available, nursing notes, old chart and ekg, as well as notes from prior ED visits. Patient presented for weakness and hypoglycemia, we will assess with labs as indicated at this time.   Procedures ____________________________________________   LABS (pertinent positives/negatives)  Labs Reviewed  GLUCOSE, CAPILLARY - Abnormal; Notable for the following components:      Result Value   Glucose-Capillary 109 (*)    All other components within normal limits  CBC WITH DIFFERENTIAL/PLATELET - Abnormal; Notable for the following components:   RBC 3.94 (*)    Hemoglobin 11.8 (*)    HCT 35.6 (*)    RDW 17.1 (*)    Platelets 110 (*)    Lymphs Abs 0.6 (*)    All other components within normal limits  COMPREHENSIVE  METABOLIC PANEL - Abnormal; Notable for the following components:   Potassium 3.3 (*)    Glucose, Bld 112 (*)    BUN 56 (*)    Creatinine, Ser 2.32 (*)    Calcium 8.7 (*)    Total Protein 6.1 (*)    Albumin 3.3 (*)    Total Bilirubin 1.6 (*)    GFR calc non Af Amer 24 (*)    GFR calc Af Amer 28 (*)    All other components within normal limits  URINALYSIS, COMPLETE (UACMP) WITH MICROSCOPIC - Abnormal; Notable for the following components:   Color, Urine YELLOW (*)    APPearance HAZY (*)    All other components within normal limits  GLUCOSE, CAPILLARY - Abnormal; Notable for the following components:   Glucose-Capillary 32 (*)    All other components within normal limits  GLUCOSE, CAPILLARY - Abnormal; Notable for the following components:   Glucose-Capillary 31 (*)    All  other components within normal limits  TROPONIN I  GLUCOSE, CAPILLARY  CBG MONITORING, ED  ____________________________________________  CRITICAL CARE Performed by: Laurence Aly   Total critical care time: 30 minutes  Critical care time was exclusive of separately billable procedures and treating other patients.  Critical care was necessary to treat or prevent imminent or life-threatening deterioration.  Critical care was time spent personally by me on the following activities: development of treatment plan with patient and/or surrogate as well as nursing, discussions with consultants, evaluation of patient's response to treatment, examination of patient, obtaining history from patient or surrogate, ordering and performing treatments and interventions, ordering and review of laboratory studies, ordering and review of radiographic studies, pulse oximetry and re-evaluation of patient's condition.   DIFFERENTIAL DIAGNOSIS   Hypoglycemia, skin tear, medication noncompliance  FINAL ASSESSMENT AND PLAN  Fall, hypoglycemia   Plan: The patient had presented for hypoglycemia and a fall secondary to  same. Patient's labs did reveal mild worsening of his creatinine at baseline.  He has a known history of chronic kidney disease.  He was given normal saline bolus but had recurrence in his hypoglycemia likely from his glipizide.  We gave D50 as well as something to eat and drink and his blood sugar is only improved to about 80.  I will start him on a dextrose infusion and discussed with the hospitalist for admission.   Laurence Aly, MD   Note: This note was generated in part or whole with voice recognition software. Voice recognition is usually quite accurate but there are transcription errors that can and very often do occur. I apologize for any typographical errors that were not detected and corrected.     Earleen Newport, MD 12/09/17 [phone]

## 2017-12-10 LAB — BASIC METABOLIC PANEL
Anion gap: 8 (ref 5–15)
BUN: 54 mg/dL — AB (ref 8–23)
CALCIUM: 8.6 mg/dL — AB (ref 8.9–10.3)
CHLORIDE: 102 mmol/L (ref 98–111)
CO2: 30 mmol/L (ref 22–32)
CREATININE: 2.4 mg/dL — AB (ref 0.61–1.24)
GFR calc Af Amer: 27 mL/min — ABNORMAL LOW (ref >60)
GFR calc non Af Amer: 23 mL/min — ABNORMAL LOW (ref >60)
Glucose, Bld: 82 mg/dL (ref 70–99)
Potassium: 3.6 mmol/L (ref 3.5–5.1)
Sodium: 140 mmol/L (ref 135–145)

## 2017-12-10 LAB — CBC
HCT: 35.5 % — ABNORMAL LOW (ref 40.0–52.0)
Hemoglobin: 12.1 g/dL — ABNORMAL LOW (ref 13.0–18.0)
MCH: 30.7 pg (ref 26.0–34.0)
MCHC: 34 g/dL (ref 32.0–36.0)
MCV: 90.3 fL (ref 80.0–100.0)
PLATELETS: 105 10*3/uL — AB (ref 150–440)
RBC: 3.93 MIL/uL — ABNORMAL LOW (ref 4.40–5.90)
RDW: 17.4 % — AB (ref 11.5–14.5)
WBC: 7.4 10*3/uL (ref 3.8–10.6)

## 2017-12-10 LAB — GLUCOSE, CAPILLARY
GLUCOSE-CAPILLARY: 166 mg/dL — AB (ref 70–99)
GLUCOSE-CAPILLARY: 178 mg/dL — AB (ref 70–99)
GLUCOSE-CAPILLARY: 70 mg/dL (ref 70–99)
GLUCOSE-CAPILLARY: 72 mg/dL (ref 70–99)
GLUCOSE-CAPILLARY: 97 mg/dL (ref 70–99)
Glucose-Capillary: 133 mg/dL — ABNORMAL HIGH (ref 70–99)
Glucose-Capillary: 61 mg/dL — ABNORMAL LOW (ref 70–99)
Glucose-Capillary: 61 mg/dL — ABNORMAL LOW (ref 70–99)
Glucose-Capillary: 71 mg/dL (ref 70–99)
Glucose-Capillary: 72 mg/dL (ref 70–99)
Glucose-Capillary: 73 mg/dL (ref 70–99)
Glucose-Capillary: 80 mg/dL (ref 70–99)
Glucose-Capillary: 84 mg/dL (ref 70–99)
Glucose-Capillary: 86 mg/dL (ref 70–99)
Glucose-Capillary: 95 mg/dL (ref 70–99)

## 2017-12-10 MED ORDER — FUROSEMIDE 40 MG PO TABS: 40.0000 mg | ORAL_TABLET | Freq: Two times a day (BID) | ORAL | 0 refills | Status: DC

## 2017-12-10 MED ORDER — DEXTROSE 50 % IV SOLN: 1.0000 | INTRAVENOUS | Status: DC | PRN

## 2017-12-10 MED ORDER — FUROSEMIDE 40 MG PO TABS
40.0000 mg | ORAL_TABLET | Freq: Every day | ORAL | Status: DC
  Administered 2017-12-10 – 2017-12-11 (×2): 40 mg via ORAL
  Filled 2017-12-10 (×2): qty 1

## 2017-12-10 NOTE — Progress Notes (Signed)
   12/10/17 0745  Clinical Encounter Type  Visited With Patient and family together  Visit Type Initial;Spiritual support;Other (Comment)  Referral From Nurse  Consult/Referral To [facility];Other (Comment)   [place] received an OR to educate patient on an AD. [place] dropped off paperwork to be completed when patient was ready. CH will follow up if needed.

## 2017-12-10 NOTE — Discharge Instructions (Addendum)
For the next two days need to check fingersticks quite often.  The next 24 hours need to check every two to three hours. Then the next 24 hours after that every four to six hours. Have orange juice or sugar packets near by in case of hypoglycemia.

## 2017-12-10 NOTE — Progress Notes (Signed)
Patient ID: Nyxon Strupp, male   DOB: 1930-09-18, 84 y.o.   MRN: [id number]  [employer] PROGRESS NOTE  Lang Zingg ALP:[id number] DOB: April 20, 1931 DOA: 12/09/2017 PCP: Jodi Marble, MD  HPI/Subjective: Patient feeling better.  No episodes of low sugars.  D10 drip discontinued this morning.  Sugars still ranging between 60 and 80.  Objective: Vitals:   12/10/17 0801 12/10/17 1236  BP: 113/71 121/79  Pulse: 67 69  Resp: 17 17  Temp: 98 F (36.7 C) 98 F (36.7 C)  SpO2: 98% 98%    Filed Weights   12/09/17 1335  Weight: 99.3 kg (219 lb)    ROS: Review of Systems  Constitutional: Negative for chills and fever.  Eyes: Negative for blurred vision.  Respiratory: Negative for cough and shortness of breath.   Cardiovascular: Negative for chest pain.  Gastrointestinal: Negative for abdominal pain, constipation, diarrhea, nausea and vomiting.  Genitourinary: Negative for dysuria.  Musculoskeletal: Negative for joint pain.  Neurological: Negative for dizziness and headaches.   Exam: Physical Exam  Constitutional: He is oriented to person, place, and time.  HENT:  Nose: No mucosal edema.  Mouth/Throat: No oropharyngeal exudate or posterior oropharyngeal edema.  Eyes: Pupils are equal, round, and reactive to light. Conjunctivae, EOM and lids are normal.  Neck: No JVD present. Carotid bruit is not present. No edema present. No thyroid mass and no thyromegaly present.  Cardiovascular: S1 normal and S2 normal. Exam reveals no gallop.  No murmur heard. Pulses:      Dorsalis pedis pulses are 2+ on the right side, and 2+ on the left side.  Respiratory: No respiratory distress. He has decreased breath sounds in the right lower field and the left lower field. He has no wheezes. He has no rhonchi. He has no rales.  GI: Soft. Bowel sounds are normal. There is no tenderness.  Musculoskeletal:       Right ankle: He exhibits swelling.       Left ankle: He exhibits swelling.   Lymphadenopathy:    He has no cervical adenopathy.  Neurological: He is alert and oriented to person, place, and time. No cranial nerve deficit.  Skin: Skin is warm. No rash noted. Nails show no clubbing.  Chronic lower extremity skin discoloration  Psychiatric: He has a normal mood and affect.      Data Reviewed: Basic Metabolic Panel: Recent Labs  Lab 12/09/17 1340 12/10/17 0430  NA 139 140  K 3.3* 3.6  CL 101 102  CO2 30 30  GLUCOSE 112* 82  BUN 56* 54*  CREATININE 2.32* 2.40*  CALCIUM 8.7* 8.6*   Liver Function Tests: Recent Labs  Lab 12/09/17 1340  AST 27  ALT 21  ALKPHOS 50  BILITOT 1.6*  PROT 6.1*  ALBUMIN 3.3*   CBC: Recent Labs  Lab 12/09/17 1340 12/10/17 0430  WBC 7.3 7.4  NEUTROABS 5.7  --   HGB 11.8* 12.1*  HCT 35.6* 35.5*  MCV 90.3 90.3  PLT 110* 105*   Cardiac Enzymes: Recent Labs  Lab 12/09/17 1340  TROPONINI <0.03    CBG: Recent Labs  Lab 12/10/17 1000 12/10/17 1031 12/10/17 1141 12/10/17 1213 12/10/17 1403  GLUCAP 61* 73 61* 80 86    Scheduled Meds: . amiodarone  200 mg Oral Daily  . aspirin EC  81 mg Oral QHS  . atorvastatin  40 mg Oral QHS  . carvedilol  25 mg Oral BID WC  . cholecalciferol  2,000 Units Oral Q breakfast  .  clopidogrel  75 mg Oral Q breakfast  . fenofibrate  160 mg Oral Daily  . fluticasone  2 spray Each Nare QHS  . furosemide  40 mg Oral Daily  . multivitamin with minerals  1 tablet Oral Q breakfast  . omega-3 acid ethyl esters  4,000 mg Oral Q1200   Continuous Infusions:  Assessment/Plan:  1. Hypoglycemia with sulfonylurea.  Patient was on D 10 drip overnight.  Patient interested in potentially going home today so D10 drip was stopped this morning.  Sugars between 60 and 80.  We will check another sugar at 4 PM and make a decision.  If he goes home he will have to check sugars every 2 hours and have family with him to make sure that he is able to check his sugar. 2. History of systolic  congestive heart failure.  No signs of congestive heart failure at this point.  Patient taking Lasix 4 times a day.  I would rather have him take it twice a day.  Patient on Coreg.  Can go back on losartan as outpatient 3. Acute kidney injury versus chronic kidney disease stage IV.  I think this is probably worsening kidney function over time and this is chronic rather than acute.  This will have to be monitored closely as outpatient on follow-up appointment. 4. Skin tear on Plavix and aspirin. 5. Hyperlipidemia unspecified on atorvastatin 6. History of atrial fibrillation on aspirin Plavix and amiodarone and Coreg.  Code Status:     Code Status Orders  (From admission, onward)        Start     Ordered   12/09/17 1836  Full code  Continuous     12/09/17 1835    Code Status History    Date Active Date Inactive Code Status Order ID Comments User Context   12/06/2013 1125 12/07/2013 1536 Full Code [id number]  Belva Crome, MD Inpatient   12/04/2013 0955 12/06/2013 1125 Full Code [id number]  Leonie Man, MD Inpatient   12/03/2013 0407 12/04/2013 0955 Full Code [id number]  Rise Patience, MD Inpatient     Family Communication: Spoke with daughters at the bedside Disposition Plan: Potentially home this evening versus tomorrow  Time spent: 40 minutes  [place]

## 2017-12-11 LAB — BASIC METABOLIC PANEL
Anion gap: 6 (ref 5–15)
BUN: 55 mg/dL — ABNORMAL HIGH (ref 8–23)
CALCIUM: 8.4 mg/dL — AB (ref 8.9–10.3)
CHLORIDE: 101 mmol/L (ref 98–111)
CO2: 30 mmol/L (ref 22–32)
CREATININE: 2.43 mg/dL — AB (ref 0.61–1.24)
GFR calc non Af Amer: 23 mL/min — ABNORMAL LOW (ref >60)
GFR, EST AFRICAN AMERICAN: 27 mL/min — AB (ref >60)
Glucose, Bld: 134 mg/dL — ABNORMAL HIGH (ref 70–99)
Potassium: 4.6 mmol/L (ref 3.5–5.1)
Sodium: 137 mmol/L (ref 135–145)

## 2017-12-11 LAB — GLUCOSE, CAPILLARY
GLUCOSE-CAPILLARY: 130 mg/dL — AB (ref 70–99)
GLUCOSE-CAPILLARY: 117 mg/dL — AB (ref 70–99)
Glucose-Capillary: 154 mg/dL — ABNORMAL HIGH (ref 70–99)
Glucose-Capillary: 126 mg/dL — ABNORMAL HIGH (ref 70–99)
Glucose-Capillary: 173 mg/dL — ABNORMAL HIGH (ref 70–99)

## 2017-12-11 NOTE — Discharge Summary (Signed)
[place] at [place] NAME: Matthew [name]    MR#:  [id number]  DATE OF BIRTH:  01-22-1931  DATE OF ADMISSION:  12/09/2017 ADMITTING PHYSICIAN: Loletha Grayer, MD  DATE OF DISCHARGE: 12/11/2017  PRIMARY CARE PHYSICIAN: Jodi Marble, MD    ADMISSION DIAGNOSIS:  Hypoglycemia [E16.2]  DISCHARGE DIAGNOSIS:  Active Problems:   Hypoglycemia   SECONDARY DIAGNOSIS:   Past Medical History:  Diagnosis Date  . Anemia   . Arthritis   . Broken arm 05/2013   Left  . CHF (congestive heart failure) ([place])   . Chronic kidney disease   . Coronary artery disease   . Diabetes mellitus without complication ([place])   . Dyspnea    DOE  . Edema    FEET/LEGS OCCAS  . Hypertension   . Myocardial infarction [facility])     HOSPITAL COURSE:   1.  Hypoglycemia with sulfonylurea, type 2 diabetes mellitus.  Patient was placed on D10 drip initially.  Recheck fingersticks every 2 hours.  Patient's fingersticks has started to trend higher.  Patient is stable for discharge home today.  I still do recommend checking fingersticks every 2-3 hours for the next 24 hours and then every 4-6 hours for the next 24 hours after that.  Patient's hemoglobin A1c is 5.8.  I would hold off on any medications at this time.  Continue to check fingersticks and bring the log to PMD.  Can consider low-dose Tradjenta if sugars start rising too much. 2.  History of systolic congestive heart failure.  No signs of congestive heart failure at this point.  I would rather have him take Lasix twice a day instead of 4 times a day.  Continue Coreg and can restart losartan as outpatient.  No Spironolactone with chronic kidney disease stage IV. 3.  Chronic kidney disease stage IV.  The patient follows with Dr. Holley Raring as outpatient and baseline creatinine as outpatient as 2.3. 4.  Skin tear on Plavix and aspirin.  Continue to keep pressure dressing on this. 5.  Hyperlipidemia unspecified on  atorvastatin 6.  History of atrial fibrillation on aspirin, Plavix amiodarone and Coreg  DISCHARGE CONDITIONS:   Satisfactory  CONSULTS OBTAINED:  None  DRUG ALLERGIES:   Allergies  Allergen Reactions  . Statins     DISCHARGE MEDICATIONS:   Allergies as of 12/11/2017      Reactions   Statins       Medication List    STOP taking these medications   glipiZIDE 2.5 MG 24 hr tablet Commonly known as:  GLUCOTROL XL     TAKE these medications   amiodarone 200 MG tablet Commonly known as:  PACERONE Take 200 mg by mouth daily.   aspirin EC 81 MG tablet Take 81 mg by mouth at bedtime.   atorvastatin 40 MG tablet Commonly known as:  LIPITOR Take 40 mg by mouth at bedtime.   carvedilol 25 MG tablet Commonly known as:  COREG Take 25 mg by mouth 2 (two) times daily with a meal.   CENTRUM SILVER ADULT 50+ Tabs Take 1 tablet by mouth daily with breakfast.   clopidogrel 75 MG tablet Commonly known as:  PLAVIX Take 1 tablet (75 mg total) by mouth daily with breakfast.   fenofibrate 160 MG tablet Take 160 mg by mouth daily.   Fish Oil 1000 MG Caps Take 4,000 mg by mouth daily at 12 noon.   FLOVENT HFA 110 MCG/ACT inhaler Generic drug:  fluticasone Inhale  2 puffs into the lungs daily.   fluticasone 50 MCG/ACT nasal spray Commonly known as:  FLONASE Place 2 sprays into both nostrils at bedtime.   furosemide 40 MG tablet Commonly known as:  LASIX Take 1 tablet (40 mg total) by mouth 2 (two) times daily. What changed:    medication strength  how much to take  when to take this   losartan 100 MG tablet Commonly known as:  COZAAR Take 1 tablet by mouth daily.   nitroGLYCERIN 0.4 MG SL tablet Commonly known as:  NITROSTAT Place 0.4 mg under the tongue every 5 (five) minutes as needed for chest pain.   PROAIR HFA 108 (90 Base) MCG/ACT inhaler Generic drug:  albuterol Inhale 2 puffs into the lungs every 6 (six) hours as needed.   Vitamin D3 2000 units  Tabs Take 2,000 Units by mouth daily with breakfast.        DISCHARGE INSTRUCTIONS:   Follow-up PMD 6 days  If you experience worsening of your admission symptoms, develop shortness of breath, life threatening emergency, suicidal or homicidal thoughts you must seek medical attention immediately by calling 911 or calling your MD immediately  if symptoms less severe.  You Must read complete instructions/literature along with all the possible adverse reactions/side effects for all the Medicines you take and that have been prescribed to you. Take any new Medicines after you have completely understood and accept all the possible adverse reactions/side effects.   Please note  You were cared for by a hospitalist during your hospital stay. If you have any questions about your discharge medications or the care you received while you were in the hospital after you are discharged, you can call the unit and asked to speak with the hospitalist on call if the hospitalist that took care of you is not available. Once you are discharged, your primary care physician will handle any further medical issues. Please note that NO REFILLS for any discharge medications will be authorized once you are discharged, as it is imperative that you return to your primary care physician (or establish a relationship with a primary care physician if you do not have one) for your aftercare needs so that they can reassess your need for medications and monitor your lab values.    Today   CHIEF COMPLAINT:   Chief Complaint  Patient presents with  . Hypoglycemia    HISTORY OF PRESENT ILLNESS:  Matthew [name]  is a 84 y.o. male with a known history of type 2 diabetes presents with hypoglycemia.   VITAL SIGNS:  Blood pressure 113/69, pulse 87, temperature 98 F (36.7 C), temperature source Oral, resp. rate 20, height 6' (1.829 m), weight 99.3 kg (219 lb), SpO2 97 %.    PHYSICAL EXAMINATION:  GENERAL:  84 y.o.-year-old  patient lying in the bed with no acute distress.  EYES: Pupils equal, round, reactive to light and accommodation. No scleral icterus. Extraocular muscles intact.  HEENT: Head atraumatic, normocephalic. Oropharynx and nasopharynx clear.  NECK:  Supple, no jugular venous distention. No thyroid enlargement, no tenderness.  LUNGS: Normal breath sounds bilaterally, no wheezing, rales,rhonchi or crepitation. No use of accessory muscles of respiration.  CARDIOVASCULAR: S1, S2 normal. No murmurs, rubs, or gallops.  ABDOMEN: Soft, non-tender, non-distended. Bowel sounds present. No organomegaly or mass.  EXTREMITIES: 2+ pedal edema.  No cyanosis, or clubbing.  NEUROLOGIC: Cranial nerves II through XII are intact. Muscle strength 5/5 in all extremities. Sensation intact. Gait not checked.  PSYCHIATRIC: The patient  is alert and oriented x 3.  SKIN: No obvious rash, lesion, or ulcer.  Skin tear left arm  DATA REVIEW:   CBC Recent Labs  Lab 12/10/17 0430  WBC 7.4  HGB 12.1*  HCT 35.5*  PLT 105*    Chemistries  Recent Labs  Lab 12/09/17 1340  12/11/17 0727  NA 139   < > 137  K 3.3*   < > 4.6  CL 101   < > 101  CO2 30   < > 30  GLUCOSE 112*   < > 134*  BUN 56*   < > 55*  CREATININE 2.32*   < > 2.43*  CALCIUM 8.7*   < > 8.4*  AST 27  --   --   ALT 21  --   --   ALKPHOS 50  --   --   BILITOT 1.6*  --   --    < > = values in this interval not displayed.    Cardiac Enzymes Recent Labs  Lab 12/09/17 1340  TROPONINI <0.03      Management plans discussed with the patient, family and they are in agreement.  CODE STATUS:     Code Status Orders  (From admission, onward)        Start     Ordered   12/09/17 1836  Full code  Continuous     12/09/17 1835    Code Status History    Date Active Date Inactive Code Status Order ID Comments User Context   12/06/2013 1125 12/07/2013 1536 Full Code [id number]  Belva Crome, MD Inpatient   12/04/2013 0955 12/06/2013 1125 Full Code  [id number]  Leonie Man, MD Inpatient   12/03/2013 0407 12/04/2013 0955 Full Code [id number]  Rise Patience, MD Inpatient      TOTAL TIME TAKING CARE OF THIS PATIENT: 35 minutes.    Loletha Grayer M.D on 12/11/2017 at 12:04 PM  Between 7am to 6pm - Pager - [phone]  After 6pm go to [email or web address] - password [email or web address]  [employer] Office  [phone]  CC: Primary care physician; Jodi Marble, MD

## 2017-12-11 NOTE — Progress Notes (Signed)
   12/11/17 1000  Clinical Encounter Type  Visited With Patient and family together  Visit Type Follow-up   Chaplain returned to complete the AD. Chaplain located witnesses and reserved notary; AD completed, unit secretary scanned document to patient's chart, then original and copy given to patient for his records.

## 2017-12-11 NOTE — Progress Notes (Signed)
Chaplain received a call to complete an AD for the patient. Chaplain went to patient room to assess readiness to complete and found that patient was still in the process of completing paperwork. Will return in about 30 minutes to move forward.    12/11/17 0900  Clinical Encounter Type  Visited With Patient and family together  Visit Type Initial

## 2017-12-11 NOTE — Progress Notes (Signed)
12/11/2017 11:40 AM  Matthew [name] to be D/C'd Home per MD order.  Discussed prescriptions and follow up appointments with the patient. Prescriptions given to patient, medication list explained in detail. Pt verbalized understanding.  Allergies as of 12/11/2017      Reactions   Statins       Medication List    STOP taking these medications   glipiZIDE 2.5 MG 24 hr tablet Commonly known as:  GLUCOTROL XL     TAKE these medications   amiodarone 200 MG tablet Commonly known as:  PACERONE Take 200 mg by mouth daily.   aspirin EC 81 MG tablet Take 81 mg by mouth at bedtime.   atorvastatin 40 MG tablet Commonly known as:  LIPITOR Take 40 mg by mouth at bedtime.   carvedilol 25 MG tablet Commonly known as:  COREG Take 25 mg by mouth 2 (two) times daily with a meal.   CENTRUM SILVER ADULT 50+ Tabs Take 1 tablet by mouth daily with breakfast.   clopidogrel 75 MG tablet Commonly known as:  PLAVIX Take 1 tablet (75 mg total) by mouth daily with breakfast.   fenofibrate 160 MG tablet Take 160 mg by mouth daily.   Fish Oil 1000 MG Caps Take 4,000 mg by mouth daily at 12 noon.   FLOVENT HFA 110 MCG/ACT inhaler Generic drug:  fluticasone Inhale 2 puffs into the lungs daily.   fluticasone 50 MCG/ACT nasal spray Commonly known as:  FLONASE Place 2 sprays into both nostrils at bedtime.   furosemide 40 MG tablet Commonly known as:  LASIX Take 1 tablet (40 mg total) by mouth 2 (two) times daily. What changed:    medication strength  how much to take  when to take this   losartan 100 MG tablet Commonly known as:  COZAAR Take 1 tablet by mouth daily.   nitroGLYCERIN 0.4 MG SL tablet Commonly known as:  NITROSTAT Place 0.4 mg under the tongue every 5 (five) minutes as needed for chest pain.   PROAIR HFA 108 (90 Base) MCG/ACT inhaler Generic drug:  albuterol Inhale 2 puffs into the lungs every 6 (six) hours as needed.   Vitamin D3 2000 units Tabs Take 2,000 Units  by mouth daily with breakfast.       Vitals:   12/11/17 0422 12/11/17 0928  BP: (!) 109/57 113/69  Pulse: 86 87  Resp: 20 20  Temp: 98.1 F (36.7 C) 98 F (36.7 C)  SpO2: 95% 97%    Skin clean, dry and intact without evidence of skin break down, no evidence of skin tears noted. IV catheter discontinued intact. Site without signs and symptoms of complications. Dressing and pressure applied. Pt denies pain at this time. No complaints noted.  An After Visit Summary was printed and given to the patient. Patient escorted via [place], and D/C home via private auto.  Dola Argyle

## 2017-12-11 NOTE — Progress Notes (Signed)
   12/11/17 0900  Clinical Encounter Type  Visited With Patient and family together  Visit Type Initial   Chaplain received a call to complete an AD for the patient. Chaplain went to patient room to assess readiness to complete and found that patient was still in the process of completing paperwork. Will return in about 30 minutes to move forward.

## 2017-12-12 LAB — GLUCOSE, CAPILLARY: GLUCOSE-CAPILLARY: 32 mg/dL — AB (ref 70–99)

## 2017-12-14 ENCOUNTER — Other Ambulatory Visit: Payer: Self-pay | Admitting: *Deleted

## 2017-12-14 DIAGNOSIS — I10 Essential (primary) hypertension: Secondary | ICD-10-CM | POA: Diagnosis not present

## 2017-12-14 DIAGNOSIS — J309 Allergic rhinitis, unspecified: Secondary | ICD-10-CM | POA: Diagnosis not present

## 2017-12-14 DIAGNOSIS — E1122 Type 2 diabetes mellitus with diabetic chronic kidney disease: Secondary | ICD-10-CM | POA: Diagnosis not present

## 2017-12-14 DIAGNOSIS — D649 Anemia, unspecified: Secondary | ICD-10-CM | POA: Diagnosis not present

## 2017-12-14 DIAGNOSIS — M545 Low back pain: Secondary | ICD-10-CM | POA: Diagnosis not present

## 2017-12-14 DIAGNOSIS — I351 Nonrheumatic aortic (valve) insufficiency: Secondary | ICD-10-CM | POA: Diagnosis not present

## 2017-12-14 DIAGNOSIS — E785 Hyperlipidemia, unspecified: Secondary | ICD-10-CM | POA: Diagnosis not present

## 2017-12-14 DIAGNOSIS — E162 Hypoglycemia, unspecified: Secondary | ICD-10-CM | POA: Diagnosis not present

## 2017-12-14 DIAGNOSIS — I4891 Unspecified atrial fibrillation: Secondary | ICD-10-CM | POA: Diagnosis not present

## 2017-12-14 NOTE — Patient Outreach (Signed)
[facility]) Care Management  12/14/2017  Matthew [name] [birth year] [id number]   EMMI-       RED ON EMMI ALERT Day # 1 Date: 12/13/17 Red Alert Reason: know who to call about changes in condition? NO   Outreach attempt #1 [facility] RN CM left HIPAA compliant voicemail message along with CM's contact info. With his wife   Plan: [facility] RN CM sent an unsuccessful outreach letter and scheduled this patient for another call attempt within 4 business days  Darien Kading L. Lavina Hamman, RN, BSN, CCM [facility] Telephonic Care Management Care Coordinator Direct number [phone]  Main [facility] number [phone] Fax number [phone]

## 2017-12-15 ENCOUNTER — Other Ambulatory Visit: Payer: Self-pay | Admitting: *Deleted

## 2017-12-15 DIAGNOSIS — I340 Nonrheumatic mitral (valve) insufficiency: Secondary | ICD-10-CM | POA: Insufficient documentation

## 2017-12-15 DIAGNOSIS — M13869 Other specified arthritis, unspecified knee: Secondary | ICD-10-CM | POA: Insufficient documentation

## 2017-12-16 NOTE — Patient Outreach (Signed)
Matthew [name] [facility]) Care Management  12/16/2017  Matthew [name] Dec 18, 1930 [id number]   EMMI-       RED ON EMMI ALERT Day # 1 Date: 12/13/17 Red Alert Reason: know who to call about changes in condition? NO   Outreach attempt # 2 Patient is able to verify HIPAA [facility] Care Management RN reviewed and addressed red alert with patient Matthew [name] is doing well He gave Cm permission to speak with his daughter Matthew [name] after the red alert reviewed CM reviewed the after summary d/c sheet and the contact number for his MD on it  Matthew [name] clarifies Matthew [name] knows the number to his primary MD, Dr Elijio Miles and has seen MD on 12/14/27 Is presently having a 6 day monitoring of blood sugars and will return on 12/20/17 for f/u with primary MD   Advised patient that there will be further automated EMMI- post discharge calls to assess how the patient is doing following the recent hospitalization Advised the patient that another call may be received from a nurse if any of their responses were abnormal. Patient voiced understanding and was appreciative of f/u call.  Plan: [facility] RN CM will close case at this time as patient has been assessed and no needs identified.    Trung Wenzl L. Lavina Hamman, RN, BSN, CCM [facility] Telephonic Care Management Care Coordinator Direct number [phone]  Main [facility] number [phone] Fax number [phone]

## 2017-12-17 DIAGNOSIS — Z7984 Long term (current) use of oral hypoglycemic drugs: Secondary | ICD-10-CM | POA: Diagnosis not present

## 2017-12-17 DIAGNOSIS — I4891 Unspecified atrial fibrillation: Secondary | ICD-10-CM | POA: Diagnosis not present

## 2017-12-17 DIAGNOSIS — M170 Bilateral primary osteoarthritis of knee: Secondary | ICD-10-CM | POA: Diagnosis not present

## 2017-12-17 DIAGNOSIS — S41112D Laceration without foreign body of left upper arm, subsequent encounter: Secondary | ICD-10-CM | POA: Diagnosis not present

## 2017-12-17 DIAGNOSIS — I5030 Unspecified diastolic (congestive) heart failure: Secondary | ICD-10-CM | POA: Diagnosis not present

## 2017-12-17 DIAGNOSIS — E669 Obesity, unspecified: Secondary | ICD-10-CM | POA: Diagnosis not present

## 2017-12-17 DIAGNOSIS — I340 Nonrheumatic mitral (valve) insufficiency: Secondary | ICD-10-CM | POA: Diagnosis not present

## 2017-12-17 DIAGNOSIS — I712 Thoracic aortic aneurysm, without rupture: Secondary | ICD-10-CM | POA: Diagnosis not present

## 2017-12-17 DIAGNOSIS — I2510 Atherosclerotic heart disease of native coronary artery without angina pectoris: Secondary | ICD-10-CM | POA: Diagnosis not present

## 2017-12-17 DIAGNOSIS — E119 Type 2 diabetes mellitus without complications: Secondary | ICD-10-CM | POA: Diagnosis not present

## 2017-12-17 DIAGNOSIS — E785 Hyperlipidemia, unspecified: Secondary | ICD-10-CM | POA: Diagnosis not present

## 2017-12-17 DIAGNOSIS — I110 Hypertensive heart disease with heart failure: Secondary | ICD-10-CM | POA: Diagnosis not present

## 2017-12-20 DIAGNOSIS — E119 Type 2 diabetes mellitus without complications: Secondary | ICD-10-CM | POA: Diagnosis not present

## 2017-12-20 DIAGNOSIS — M170 Bilateral primary osteoarthritis of knee: Secondary | ICD-10-CM | POA: Diagnosis not present

## 2017-12-20 DIAGNOSIS — I2510 Atherosclerotic heart disease of native coronary artery without angina pectoris: Secondary | ICD-10-CM | POA: Diagnosis not present

## 2017-12-20 DIAGNOSIS — I5030 Unspecified diastolic (congestive) heart failure: Secondary | ICD-10-CM | POA: Diagnosis not present

## 2017-12-20 DIAGNOSIS — I110 Hypertensive heart disease with heart failure: Secondary | ICD-10-CM | POA: Diagnosis not present

## 2017-12-20 DIAGNOSIS — S41112D Laceration without foreign body of left upper arm, subsequent encounter: Secondary | ICD-10-CM | POA: Diagnosis not present

## 2017-12-23 DIAGNOSIS — S41112D Laceration without foreign body of left upper arm, subsequent encounter: Secondary | ICD-10-CM | POA: Diagnosis not present

## 2017-12-23 DIAGNOSIS — I2510 Atherosclerotic heart disease of native coronary artery without angina pectoris: Secondary | ICD-10-CM | POA: Diagnosis not present

## 2017-12-23 DIAGNOSIS — E119 Type 2 diabetes mellitus without complications: Secondary | ICD-10-CM | POA: Diagnosis not present

## 2017-12-23 DIAGNOSIS — M170 Bilateral primary osteoarthritis of knee: Secondary | ICD-10-CM | POA: Diagnosis not present

## 2017-12-23 DIAGNOSIS — I5030 Unspecified diastolic (congestive) heart failure: Secondary | ICD-10-CM | POA: Diagnosis not present

## 2017-12-23 DIAGNOSIS — I110 Hypertensive heart disease with heart failure: Secondary | ICD-10-CM | POA: Diagnosis not present

## 2017-12-26 DIAGNOSIS — I4891 Unspecified atrial fibrillation: Secondary | ICD-10-CM | POA: Diagnosis not present

## 2017-12-26 DIAGNOSIS — I351 Nonrheumatic aortic (valve) insufficiency: Secondary | ICD-10-CM | POA: Diagnosis not present

## 2017-12-26 DIAGNOSIS — E785 Hyperlipidemia, unspecified: Secondary | ICD-10-CM | POA: Diagnosis not present

## 2017-12-26 DIAGNOSIS — E1122 Type 2 diabetes mellitus with diabetic chronic kidney disease: Secondary | ICD-10-CM | POA: Diagnosis not present

## 2017-12-26 DIAGNOSIS — E162 Hypoglycemia, unspecified: Secondary | ICD-10-CM | POA: Diagnosis not present

## 2017-12-26 DIAGNOSIS — M545 Low back pain: Secondary | ICD-10-CM | POA: Diagnosis not present

## 2017-12-26 DIAGNOSIS — I10 Essential (primary) hypertension: Secondary | ICD-10-CM | POA: Diagnosis not present

## 2017-12-26 DIAGNOSIS — D649 Anemia, unspecified: Secondary | ICD-10-CM | POA: Diagnosis not present

## 2017-12-26 DIAGNOSIS — G4700 Insomnia, unspecified: Secondary | ICD-10-CM | POA: Diagnosis not present

## 2017-12-26 DIAGNOSIS — I2510 Atherosclerotic heart disease of native coronary artery without angina pectoris: Secondary | ICD-10-CM | POA: Diagnosis not present

## 2017-12-27 DIAGNOSIS — I110 Hypertensive heart disease with heart failure: Secondary | ICD-10-CM | POA: Diagnosis not present

## 2017-12-27 DIAGNOSIS — I5030 Unspecified diastolic (congestive) heart failure: Secondary | ICD-10-CM | POA: Diagnosis not present

## 2017-12-27 DIAGNOSIS — S41112D Laceration without foreign body of left upper arm, subsequent encounter: Secondary | ICD-10-CM | POA: Diagnosis not present

## 2017-12-27 DIAGNOSIS — I2510 Atherosclerotic heart disease of native coronary artery without angina pectoris: Secondary | ICD-10-CM | POA: Diagnosis not present

## 2017-12-27 DIAGNOSIS — M170 Bilateral primary osteoarthritis of knee: Secondary | ICD-10-CM | POA: Diagnosis not present

## 2017-12-27 DIAGNOSIS — E119 Type 2 diabetes mellitus without complications: Secondary | ICD-10-CM | POA: Diagnosis not present

## 2017-12-30 DIAGNOSIS — I110 Hypertensive heart disease with heart failure: Secondary | ICD-10-CM | POA: Diagnosis not present

## 2017-12-30 DIAGNOSIS — I5030 Unspecified diastolic (congestive) heart failure: Secondary | ICD-10-CM | POA: Diagnosis not present

## 2017-12-30 DIAGNOSIS — M170 Bilateral primary osteoarthritis of knee: Secondary | ICD-10-CM | POA: Diagnosis not present

## 2017-12-30 DIAGNOSIS — S41112D Laceration without foreign body of left upper arm, subsequent encounter: Secondary | ICD-10-CM | POA: Diagnosis not present

## 2017-12-30 DIAGNOSIS — E119 Type 2 diabetes mellitus without complications: Secondary | ICD-10-CM | POA: Diagnosis not present

## 2017-12-30 DIAGNOSIS — I2510 Atherosclerotic heart disease of native coronary artery without angina pectoris: Secondary | ICD-10-CM | POA: Diagnosis not present

## 2018-01-03 DIAGNOSIS — R05 Cough: Secondary | ICD-10-CM | POA: Diagnosis not present

## 2018-01-03 DIAGNOSIS — J309 Allergic rhinitis, unspecified: Secondary | ICD-10-CM | POA: Diagnosis not present

## 2018-01-03 DIAGNOSIS — J301 Allergic rhinitis due to pollen: Secondary | ICD-10-CM | POA: Diagnosis not present

## 2018-01-03 DIAGNOSIS — N289 Disorder of kidney and ureter, unspecified: Secondary | ICD-10-CM | POA: Diagnosis not present

## 2018-01-03 DIAGNOSIS — I509 Heart failure, unspecified: Secondary | ICD-10-CM | POA: Diagnosis not present

## 2018-01-03 DIAGNOSIS — K219 Gastro-esophageal reflux disease without esophagitis: Secondary | ICD-10-CM | POA: Diagnosis not present

## 2018-01-04 DIAGNOSIS — I110 Hypertensive heart disease with heart failure: Secondary | ICD-10-CM | POA: Diagnosis not present

## 2018-01-04 DIAGNOSIS — M170 Bilateral primary osteoarthritis of knee: Secondary | ICD-10-CM | POA: Diagnosis not present

## 2018-01-04 DIAGNOSIS — I5030 Unspecified diastolic (congestive) heart failure: Secondary | ICD-10-CM | POA: Diagnosis not present

## 2018-01-04 DIAGNOSIS — I2510 Atherosclerotic heart disease of native coronary artery without angina pectoris: Secondary | ICD-10-CM | POA: Diagnosis not present

## 2018-01-04 DIAGNOSIS — E119 Type 2 diabetes mellitus without complications: Secondary | ICD-10-CM | POA: Diagnosis not present

## 2018-01-04 DIAGNOSIS — S41112D Laceration without foreign body of left upper arm, subsequent encounter: Secondary | ICD-10-CM | POA: Diagnosis not present

## 2018-01-10 ENCOUNTER — Inpatient Hospital Stay: Payer: Medicare Other

## 2018-01-10 ENCOUNTER — Encounter: Payer: Self-pay | Admitting: Internal Medicine

## 2018-01-10 ENCOUNTER — Inpatient Hospital Stay
Admission: AD | Admit: 2018-01-10 | Discharge: 2018-01-15 | DRG: 291 | Disposition: A | Discharge status: Home Health | Payer: Medicare Other | Source: Ambulatory Visit | Attending: Internal Medicine | Admitting: Internal Medicine

## 2018-01-10 ENCOUNTER — Inpatient Hospital Stay
Admission: AD | Admit: 2018-01-10 | Discharge: 2018-01-10 | Disposition: A | Discharge status: Home / Self Care | Payer: Medicare Other | Source: Ambulatory Visit | Attending: Internal Medicine | Admitting: Internal Medicine

## 2018-01-10 ENCOUNTER — Other Ambulatory Visit: Payer: Self-pay

## 2018-01-10 DIAGNOSIS — Z955 Presence of coronary angioplasty implant and graft: Secondary | ICD-10-CM | POA: Diagnosis not present

## 2018-01-10 DIAGNOSIS — Z87891 Personal history of nicotine dependence: Secondary | ICD-10-CM | POA: Diagnosis not present

## 2018-01-10 DIAGNOSIS — Z79899 Other long term (current) drug therapy: Secondary | ICD-10-CM | POA: Diagnosis not present

## 2018-01-10 DIAGNOSIS — D631 Anemia in chronic kidney disease: Secondary | ICD-10-CM | POA: Diagnosis not present

## 2018-01-10 DIAGNOSIS — E1122 Type 2 diabetes mellitus with diabetic chronic kidney disease: Secondary | ICD-10-CM | POA: Diagnosis present

## 2018-01-10 DIAGNOSIS — I252 Old myocardial infarction: Secondary | ICD-10-CM

## 2018-01-10 DIAGNOSIS — I509 Heart failure, unspecified: Secondary | ICD-10-CM | POA: Diagnosis not present

## 2018-01-10 DIAGNOSIS — I959 Hypotension, unspecified: Secondary | ICD-10-CM | POA: Diagnosis not present

## 2018-01-10 DIAGNOSIS — Z888 Allergy status to other drugs, medicaments and biological substances status: Secondary | ICD-10-CM | POA: Diagnosis not present

## 2018-01-10 DIAGNOSIS — I77810 Thoracic aortic ectasia: Secondary | ICD-10-CM | POA: Diagnosis present

## 2018-01-10 DIAGNOSIS — K219 Gastro-esophageal reflux disease without esophagitis: Secondary | ICD-10-CM | POA: Diagnosis present

## 2018-01-10 DIAGNOSIS — I130 Hypertensive heart and chronic kidney disease with heart failure and stage 1 through stage 4 chronic kidney disease, or unspecified chronic kidney disease: Principal | ICD-10-CM | POA: Diagnosis present

## 2018-01-10 DIAGNOSIS — I5023 Acute on chronic systolic (congestive) heart failure: Secondary | ICD-10-CM | POA: Diagnosis present

## 2018-01-10 DIAGNOSIS — R601 Generalized edema: Secondary | ICD-10-CM | POA: Diagnosis not present

## 2018-01-10 DIAGNOSIS — R05 Cough: Secondary | ICD-10-CM

## 2018-01-10 DIAGNOSIS — N184 Chronic kidney disease, stage 4 (severe): Secondary | ICD-10-CM | POA: Diagnosis present

## 2018-01-10 DIAGNOSIS — Z9841 Cataract extraction status, right eye: Secondary | ICD-10-CM | POA: Diagnosis not present

## 2018-01-10 DIAGNOSIS — I5020 Unspecified systolic (congestive) heart failure: Secondary | ICD-10-CM | POA: Diagnosis not present

## 2018-01-10 DIAGNOSIS — Z951 Presence of aortocoronary bypass graft: Secondary | ICD-10-CM

## 2018-01-10 DIAGNOSIS — M1990 Unspecified osteoarthritis, unspecified site: Secondary | ICD-10-CM | POA: Diagnosis present

## 2018-01-10 DIAGNOSIS — Z7902 Long term (current) use of antithrombotics/antiplatelets: Secondary | ICD-10-CM

## 2018-01-10 DIAGNOSIS — Z66 Do not resuscitate: Secondary | ICD-10-CM | POA: Diagnosis present

## 2018-01-10 DIAGNOSIS — N179 Acute kidney failure, unspecified: Secondary | ICD-10-CM | POA: Diagnosis present

## 2018-01-10 DIAGNOSIS — Z9842 Cataract extraction status, left eye: Secondary | ICD-10-CM

## 2018-01-10 DIAGNOSIS — Z7982 Long term (current) use of aspirin: Secondary | ICD-10-CM | POA: Diagnosis not present

## 2018-01-10 DIAGNOSIS — Z880 Allergy status to penicillin: Secondary | ICD-10-CM

## 2018-01-10 DIAGNOSIS — I2510 Atherosclerotic heart disease of native coronary artery without angina pectoris: Secondary | ICD-10-CM | POA: Diagnosis present

## 2018-01-10 DIAGNOSIS — I5021 Acute systolic (congestive) heart failure: Secondary | ICD-10-CM | POA: Diagnosis not present

## 2018-01-10 DIAGNOSIS — I082 Rheumatic disorders of both aortic and tricuspid valves: Secondary | ICD-10-CM | POA: Diagnosis present

## 2018-01-10 DIAGNOSIS — Z961 Presence of intraocular lens: Secondary | ICD-10-CM | POA: Diagnosis present

## 2018-01-10 DIAGNOSIS — Z9049 Acquired absence of other specified parts of digestive tract: Secondary | ICD-10-CM | POA: Diagnosis not present

## 2018-01-10 DIAGNOSIS — E785 Hyperlipidemia, unspecified: Secondary | ICD-10-CM | POA: Diagnosis not present

## 2018-01-10 DIAGNOSIS — E8770 Fluid overload, unspecified: Secondary | ICD-10-CM | POA: Diagnosis present

## 2018-01-10 DIAGNOSIS — R809 Proteinuria, unspecified: Secondary | ICD-10-CM | POA: Diagnosis not present

## 2018-01-10 DIAGNOSIS — I10 Essential (primary) hypertension: Secondary | ICD-10-CM | POA: Diagnosis not present

## 2018-01-10 DIAGNOSIS — Z8249 Family history of ischemic heart disease and other diseases of the circulatory system: Secondary | ICD-10-CM | POA: Diagnosis not present

## 2018-01-10 DIAGNOSIS — R059 Cough, unspecified: Secondary | ICD-10-CM

## 2018-01-10 DIAGNOSIS — J90 Pleural effusion, not elsewhere classified: Secondary | ICD-10-CM | POA: Diagnosis not present

## 2018-01-10 DIAGNOSIS — N183 Chronic kidney disease, stage 3 (moderate): Secondary | ICD-10-CM | POA: Diagnosis not present

## 2018-01-10 LAB — CBC
HCT: 31.4 % — ABNORMAL LOW (ref 40.0–52.0)
Hemoglobin: 10.7 g/dL — ABNORMAL LOW (ref 13.0–18.0)
MCH: 30.3 pg (ref 26.0–34.0)
MCHC: 34 g/dL (ref 32.0–36.0)
MCV: 89.1 fL (ref 80.0–100.0)
PLATELETS: 141 10*3/uL — AB (ref 150–440)
RBC: 3.52 MIL/uL — AB (ref 4.40–5.90)
RDW: 17.8 % — AB (ref 11.5–14.5)
WBC: 5.8 10*3/uL (ref 3.8–10.6)

## 2018-01-10 LAB — BASIC METABOLIC PANEL
ANION GAP: 11 (ref 5–15)
BUN: 72 mg/dL — ABNORMAL HIGH (ref 8–23)
CO2: 28 mmol/L (ref 22–32)
Calcium: 8.9 mg/dL (ref 8.9–10.3)
Chloride: 103 mmol/L (ref 98–111)
Creatinine, Ser: 3.57 mg/dL — ABNORMAL HIGH (ref 0.61–1.24)
GFR calc Af Amer: 17 mL/min — ABNORMAL LOW (ref >60)
GFR, EST NON AFRICAN AMERICAN: 14 mL/min — AB (ref >60)
GLUCOSE: 102 mg/dL — AB (ref 70–99)
POTASSIUM: 3.6 mmol/L (ref 3.5–5.1)
Sodium: 142 mmol/L (ref 135–145)

## 2018-01-10 LAB — GLUCOSE, CAPILLARY
GLUCOSE-CAPILLARY: 94 mg/dL (ref 70–99)
Glucose-Capillary: 129 mg/dL — ABNORMAL HIGH (ref 70–99)
Glucose-Capillary: 140 mg/dL — ABNORMAL HIGH (ref 70–99)

## 2018-01-10 MED ORDER — INSULIN ASPART 100 UNIT/ML ~~LOC~~ SOLN
0.0000 [IU] | Freq: Three times a day (TID) | SUBCUTANEOUS | Status: DC
  Administered 2018-01-10 – 2018-01-11 (×2): 1 [IU] via SUBCUTANEOUS
  Filled 2018-01-10 (×2): qty 1

## 2018-01-10 MED ORDER — POTASSIUM CHLORIDE CRYS ER 20 MEQ PO TBCR
40.0000 meq | EXTENDED_RELEASE_TABLET | Freq: Every day | ORAL | Status: DC
  Administered 2018-01-10 – 2018-01-15 (×6): 40 meq via ORAL
  Filled 2018-01-10 (×6): qty 2

## 2018-01-10 MED ORDER — AMIODARONE HCL 200 MG PO TABS
200.0000 mg | ORAL_TABLET | Freq: Every day | ORAL | Status: DC
  Administered 2018-01-11 – 2018-01-15 (×5): 200 mg via ORAL
  Filled 2018-01-10 (×5): qty 1

## 2018-01-10 MED ORDER — DEXTROMETHORPHAN POLISTIREX ER 30 MG/5ML PO SUER
30.0000 mg | Freq: Two times a day (BID) | ORAL | Status: DC
  Administered 2018-01-10 – 2018-01-15 (×10): 30 mg via ORAL
  Filled 2018-01-10 (×11): qty 5

## 2018-01-10 MED ORDER — GUAIFENESIN ER 600 MG PO TB12
600.0000 mg | ORAL_TABLET | Freq: Two times a day (BID) | ORAL | Status: DC
  Administered 2018-01-10 – 2018-01-15 (×10): 600 mg via ORAL
  Filled 2018-01-10 (×10): qty 1

## 2018-01-10 MED ORDER — ASPIRIN EC 81 MG PO TBEC
81.0000 mg | DELAYED_RELEASE_TABLET | Freq: Every day | ORAL | Status: DC
  Administered 2018-01-10 – 2018-01-14 (×5): 81 mg via ORAL
  Filled 2018-01-10 (×5): qty 1

## 2018-01-10 MED ORDER — LOSARTAN POTASSIUM 50 MG PO TABS
100.0000 mg | ORAL_TABLET | Freq: Every day | ORAL | Status: DC
  Administered 2018-01-11 – 2018-01-15 (×5): 100 mg via ORAL
  Filled 2018-01-10 (×5): qty 2

## 2018-01-10 MED ORDER — DM-GUAIFENESIN ER 30-600 MG PO TB12: 1.0000 | ORAL_TABLET | Freq: Two times a day (BID) | ORAL | Status: DC

## 2018-01-10 MED ORDER — ATORVASTATIN CALCIUM 20 MG PO TABS
40.0000 mg | ORAL_TABLET | Freq: Every day | ORAL | Status: DC
  Administered 2018-01-10 – 2018-01-14 (×5): 40 mg via ORAL
  Filled 2018-01-10 (×5): qty 2

## 2018-01-10 MED ORDER — ADULT MULTIVITAMIN W/MINERALS CH
1.0000 | ORAL_TABLET | Freq: Every day | ORAL | Status: DC
  Administered 2018-01-11 – 2018-01-15 (×5): 1 via ORAL
  Filled 2018-01-10 (×5): qty 1

## 2018-01-10 MED ORDER — FLUTICASONE PROPIONATE 50 MCG/ACT NA SUSP
2.0000 | Freq: Every day | NASAL | Status: DC
  Administered 2018-01-10 – 2018-01-14 (×6): 2 via NASAL
  Filled 2018-01-10: qty 16

## 2018-01-10 MED ORDER — INSULIN ASPART 100 UNIT/ML ~~LOC~~ SOLN: 0.0000 [IU] | Freq: Every day | SUBCUTANEOUS | Status: DC

## 2018-01-10 MED ORDER — FUROSEMIDE 10 MG/ML IJ SOLN
6.0000 mg/h | INTRAVENOUS | Status: DC
  Administered 2018-01-10 – 2018-01-14 (×3): 6 mg/h via INTRAVENOUS
  Filled 2018-01-10 (×3): qty 25

## 2018-01-10 MED ORDER — IPRATROPIUM-ALBUTEROL 0.5-2.5 (3) MG/3ML IN SOLN
3.0000 mL | Freq: Four times a day (QID) | RESPIRATORY_TRACT | Status: DC
  Administered 2018-01-10 – 2018-01-12 (×7): 3 mL via RESPIRATORY_TRACT
  Filled 2018-01-10 (×7): qty 3

## 2018-01-10 MED ORDER — CARVEDILOL 12.5 MG PO TABS
12.5000 mg | ORAL_TABLET | Freq: Two times a day (BID) | ORAL | Status: DC
  Administered 2018-01-10 – 2018-01-15 (×9): 12.5 mg via ORAL
  Filled 2018-01-10 (×9): qty 1

## 2018-01-10 MED ORDER — BUDESONIDE 0.5 MG/2ML IN SUSP
0.5000 mg | Freq: Two times a day (BID) | RESPIRATORY_TRACT | Status: DC
  Administered 2018-01-10 – 2018-01-15 (×10): 0.5 mg via RESPIRATORY_TRACT
  Filled 2018-01-10 (×10): qty 2

## 2018-01-10 MED ORDER — ONDANSETRON HCL 4 MG/2ML IJ SOLN: 4.0000 mg | Freq: Four times a day (QID) | INTRAMUSCULAR | Status: DC | PRN

## 2018-01-10 MED ORDER — OMEGA-3-ACID ETHYL ESTERS 1 G PO CAPS
4000.0000 mg | ORAL_CAPSULE | Freq: Every day | ORAL | Status: DC
  Administered 2018-01-10 – 2018-01-15 (×5): 4000 mg via ORAL
  Filled 2018-01-10 (×7): qty 4

## 2018-01-10 MED ORDER — ACETAMINOPHEN 650 MG RE SUPP: 650.0000 mg | Freq: Four times a day (QID) | RECTAL | Status: DC | PRN

## 2018-01-10 MED ORDER — ENOXAPARIN SODIUM 30 MG/0.3ML ~~LOC~~ SOLN
30.0000 mg | SUBCUTANEOUS | Status: DC
  Administered 2018-01-10 – 2018-01-14 (×5): 30 mg via SUBCUTANEOUS
  Filled 2018-01-10 (×5): qty 0.3

## 2018-01-10 MED ORDER — ONDANSETRON HCL 4 MG PO TABS: 4.0000 mg | ORAL_TABLET | Freq: Four times a day (QID) | ORAL | Status: DC | PRN

## 2018-01-10 MED ORDER — VITAMIN D3 25 MCG (1000 UNIT) PO TABS
2000.0000 [IU] | ORAL_TABLET | Freq: Every day | ORAL | Status: DC
  Administered 2018-01-11 – 2018-01-15 (×5): 2000 [IU] via ORAL
  Filled 2018-01-10 (×5): qty 2

## 2018-01-10 MED ORDER — ACETAMINOPHEN 325 MG PO TABS: 650.0000 mg | ORAL_TABLET | Freq: Four times a day (QID) | ORAL | Status: DC | PRN

## 2018-01-10 MED ORDER — NITROGLYCERIN 0.4 MG SL SUBL: 0.4000 mg | SUBLINGUAL_TABLET | SUBLINGUAL | Status: DC | PRN

## 2018-01-10 MED ORDER — CLOPIDOGREL BISULFATE 75 MG PO TABS
75.0000 mg | ORAL_TABLET | Freq: Every day | ORAL | Status: DC
  Administered 2018-01-11 – 2018-01-15 (×5): 75 mg via ORAL
  Filled 2018-01-10 (×5): qty 1

## 2018-01-10 NOTE — H&P (Signed)
[place] at [place] NAME: Matthew [name]    MR#:  [id number]  DATE OF BIRTH:  10/07/1930  DATE OF ADMISSION:  01/10/2018  PRIMARY CARE PHYSICIAN: Jodi Marble, MD   REQUESTING/REFERRING PHYSICIAN: Dr Anthonette Legato  CHIEF COMPLAINT:  Sent in for fluid overload  HISTORY OF PRESENT ILLNESS:  Matthew [name]  is a 84 y.o. male with a known history of patient had a routine appointment with Dr. Holley Raring today and was sent in for further evaluation.  Patient has been gaining lots of fluid 15 to 20 pounds of the last couple weeks.  Fluid is all over.  Patient complains of some shortness of breath and a dry cough.  No chest pain.  Complains of weakness.  PAST MEDICAL HISTORY:   Past Medical History:  Diagnosis Date  . Anemia   . Arthritis   . Broken arm 05/2013   Left  . CHF (congestive heart failure) ([place])   . Chronic kidney disease   . Coronary artery disease   . Diabetes mellitus without complication ([place])   . Dyspnea    DOE  . Edema    FEET/LEGS OCCAS  . Hypertension   . Myocardial infarction [facility])     PAST SURGICAL HISTORY:   Past Surgical History:  Procedure Laterality Date  . CATARACT EXTRACTION W/PHACO Left 07/26/2017   Procedure: CATARACT EXTRACTION PHACO AND INTRAOCULAR LENS PLACEMENT (IOC);  Surgeon: Birder Robson, MD;  Location: [facility] ORS;  Service: Ophthalmology;  Laterality: Left;  [place]   00:45.8 AP%  13.1 CDE  6.00 Fluid Pack Lot # [id number]  . CATARACT EXTRACTION W/PHACO Right 08/17/2017   Procedure: CATARACT EXTRACTION PHACO AND INTRAOCULAR LENS PLACEMENT (IOC);  Surgeon: Birder Robson, MD;  Location: [facility] ORS;  Service: Ophthalmology;  Laterality: Right;  [place]  01:30 AP% 16.8 CDE 15.24 Fluid pack lot # [id number]  . CHOLECYSTECTOMY    . CORONARY ANGIOPLASTY     STENTS  . CORONARY ARTERY BYPASS GRAFT    . FRACTURE SURGERY     ARM  . LEFT HEART CATHETERIZATION WITH CORONARY/GRAFT ANGIOGRAM Left 12/04/2013    Procedure: LEFT HEART CATHETERIZATION WITH Beatrix Fetters;  Surgeon: Leonie Man, MD;  Location[facility] CATH LAB;  Service: Cardiovascular;  Laterality: Left;  . PERCUTANEOUS CORONARY STENT INTERVENTION (PCI-S) N/A 12/06/2013   Procedure: PERCUTANEOUS CORONARY STENT INTERVENTION (PCI-S);  Surgeon: Sinclair Grooms, MD;  Location: [facility] CATH LAB;  Service: Cardiovascular;  Laterality: N/A;  . VEIN BYPASS SURGERY     [place] Bypass [facility]     SOCIAL HISTORY:   Social History   Tobacco Use  . Smoking status: Former Smoker    Types: Cigarettes  . Smokeless tobacco: Never Used  . Tobacco comment: quit 30 yrs ago, smoked 14-15 yrs, mainly pipe  Substance Use Topics  . Alcohol use: Yes    Alcohol/week: 1.0 standard drinks    Types: 1 Glasses of wine per week    Comment: occasionally drinking only    FAMILY HISTORY:   Family History  Problem Relation Age of Onset  . Heart attack Father 41       died first MI at age 63  . Heart failure Mother     DRUG ALLERGIES:   Allergies  Allergen Reactions  . Statins     REVIEW OF SYSTEMS:  CONSTITUTIONAL: No fever, fatigue or weakness.  EYES: No blurred or double vision.  EARS, NOSE, AND THROAT: No tinnitus or ear pain.  No sore throat RESPIRATORY: No cough, shortness of breath, wheezing or hemoptysis.  CARDIOVASCULAR: No chest pain, orthopnea, edema.  GASTROINTESTINAL: No nausea, vomiting, diarrhea or abdominal pain. No blood in bowel movements GENITOURINARY: No dysuria, hematuria.  ENDOCRINE: No polyuria, nocturia,  HEMATOLOGY: No anemia, easy bruising or bleeding SKIN: No rash or lesion. MUSCULOSKELETAL: No joint pain or arthritis.   NEUROLOGIC: No tingling, numbness, weakness.  PSYCHIATRY: No anxiety or depression.   MEDICATIONS AT HOME:   Prior to Admission medications   Medication Sig Start Date End Date Taking? Authorizing Provider  amiodarone (PACERONE) 200 MG tablet Take 200 mg by mouth daily.    [provider]  aspirin EC 81 MG tablet Take 81 mg by mouth at bedtime.    [provider]  atorvastatin (LIPITOR) 40 MG tablet Take 40 mg by mouth at bedtime.    [provider]  carvedilol (COREG) 25 MG tablet Take 25 mg by mouth 2 (two) times daily with a meal.    [provider]  Cholecalciferol (VITAMIN D3) 2000 UNITS TABS Take 2,000 Units by mouth daily with breakfast.    [provider]  clopidogrel (PLAVIX) 75 MG tablet Take 1 tablet (75 mg total) by mouth daily with breakfast. 12/07/13   Almyra Deforest, PA  fenofibrate 160 MG tablet Take 160 mg by mouth daily.    [provider]  FLOVENT HFA 110 MCG/ACT inhaler Inhale 2 puffs into the lungs daily. 12/01/17   [provider]  fluticasone (FLONASE) 50 MCG/ACT nasal spray Place 2 sprays into both nostrils at bedtime.    [provider]  furosemide (LASIX) 40 MG tablet Take 1 tablet (40 mg total) by mouth 2 (two) times daily. 12/10/17   Loletha Grayer, MD  losartan (COZAAR) 100 MG tablet Take 1 tablet by mouth daily. 11/10/17   [provider]  Multiple Vitamins-Minerals (CENTRUM SILVER ADULT 50+) TABS Take 1 tablet by mouth daily with breakfast.    [provider]  nitroGLYCERIN (NITROSTAT) 0.4 MG SL tablet Place 0.4 mg under the tongue every 5 (five) minutes as needed for chest pain.    [provider]  Omega-3 Fatty Acids (FISH OIL) 1000 MG CAPS Take 4,000 mg by mouth daily at 12 noon.     [provider]  PROAIR HFA 108 [phone] Base) MCG/ACT inhaler Inhale 2 puffs into the lungs every 6 (six) hours as needed. 09/20/17   [provider]      VITAL SIGNS:  Blood pressure 98/66, pulse 63, temperature (!) 97.5 F (36.4 C), temperature source Oral, resp. rate 19, SpO2 98 %.  PHYSICAL EXAMINATION:  GENERAL:  84 y.o.-year-old patient lying in the bed with no acute distress.  EYES: Pupils equal, round, reactive to light and accommodation. No  scleral icterus. Extraocular muscles intact.  HEENT: Head atraumatic, normocephalic. Oropharynx and nasopharynx clear.  NECK:  Supple, no jugular venous distention. No thyroid enlargement, no tenderness.  LUNGS: Decreased breath sounds bilateral bases, no wheezing, rales,rhonchi or crepitation. No use of accessory muscles of respiration.  CARDIOVASCULAR: S1, S2 normal. No murmurs, rubs, or gallops.  ABDOMEN: Soft, nontender, nondistended. Bowel sounds present. No organomegaly or mass.  EXTREMITIES: 2+ pedal edema, no cyanosis, or clubbing.  NEUROLOGIC: Cranial nerves II through XII are intact. Muscle strength 5/5 in all extremities. Sensation intact. Gait not checked.  PSYCHIATRIC: The patient is alert and oriented x 3.  SKIN: No rash, lesion, or ulcer.   LABORATORY PANEL:   Ordered by  me  RADIOLOGY:  Ordered by me  EKG:   Ordered by me  IMPRESSION AND PLAN:   1.  Acute on chronic systolic congestive heart failure with fluid overload and anasarca.  Admission labs.  Start Lasix drip to remove fluid.  Admission chest x-ray and EKG.  Telemetry monitoring. 2.  Chronic kidney disease stage IV.  Monitor with diuresis.  Admit labs. 3.  Type 2 diabetes mellitus.  Recently had hypoglycemic episode and was taken off glipizide.  Medication reconciliation still undergoing.  Put on sliding scale for right now. 4.  Relative hypotension.  Cut back dose of Coreg to 12.5 mg twice daily. 5.  History of arrhythmia on amiodarone and Coreg.  Aspirin and Plavix. 6.  History of CAD on aspirin Plavix and statin 7.  Hyperlipidemia unspecified on atorvastatin.   All the records are reviewed and case discussed with ED provider. Management plans discussed with the patient, family and they are in agreement.  CODE STATUS: DNR  TOTAL TIME TAKING CARE OF THIS PATIENT: 50 minutes, including ACP time   Loletha Grayer M.D on 01/10/2018 at 12:52 PM  Between 7am to 6pm - Pager - [phone]  After 6pm call  admission pager [phone]  [employer] Office  [phone]  CC: Primary care physician; Jodi Marble, MD

## 2018-01-10 NOTE — Progress Notes (Signed)
Patient is a direct admit to room 256 with fluid volume overload. Patient alert oriented x 4, no c/o pain at this time. Patient in bed at lower position bedside table, call light and telephone withiin patient reach. Daughter at bedside. Will continue to monitor patient.

## 2018-01-10 NOTE — Progress Notes (Signed)
Pt requesting something for cough, Dr. Duane Boston to put in orders for mucinex + delsym. Will give & continue to monitor. Conley Simmonds, RN, BSN

## 2018-01-10 NOTE — Plan of Care (Signed)
  Problem: Education: Goal: Ability to demonstrate management of disease process will improve Outcome: Progressing Goal: Ability to verbalize understanding of medication therapies will improve Outcome: Progressing   Problem: Cardiac: Goal: Ability to achieve and maintain adequate cardiopulmonary perfusion will improve Outcome: Progressing   Problem: Clinical Measurements: Goal: Will remain free from infection Outcome: Progressing Goal: Diagnostic test results will improve Outcome: Progressing Goal: Respiratory complications will improve Outcome: Progressing   Problem: Nutrition: Goal: Adequate nutrition will be maintained Outcome: Progressing   Problem: Coping: Goal: Level of anxiety will decrease Outcome: Progressing   Problem: Elimination: Goal: Will not experience complications related to bowel motility Outcome: Progressing Goal: Will not experience complications related to urinary retention Outcome: Progressing   Problem: Pain Managment: Goal: General experience of comfort will improve Outcome: Progressing

## 2018-01-10 NOTE — Progress Notes (Signed)
Patient ID: Matthew [name], male   DOB: Nov 09, 1930, 84 y.o.   MRN: [id number]  ACP note  Patient and daughter at bedside  Diagnosis acute on chronic systolic congestive heart failure, anasarca, chronic kidney disease stage IV, type 2 diabetes mellitus, relative hypotension, history of CAD, hyperlipidemia  CODE STATUS discussed and patient is a DO NOT RESUSCITATE  Plan.  IV diuresis with Lasix drip.  Recheck echocardiogram.  Continue to monitor closely while here in the hospital.  Time spent on ACP discussion 17 minutes Dr. Loletha Grayer

## 2018-01-11 LAB — ECHOCARDIOGRAM COMPLETE
HEIGHTINCHES: 72 in
WEIGHTICAEL: 3744 [oz_av]

## 2018-01-11 LAB — BASIC METABOLIC PANEL
ANION GAP: 10 (ref 5–15)
BUN: 78 mg/dL — ABNORMAL HIGH (ref 8–23)
CALCIUM: 9 mg/dL (ref 8.9–10.3)
CHLORIDE: 104 mmol/L (ref 98–111)
CO2: 28 mmol/L (ref 22–32)
Creatinine, Ser: 3.61 mg/dL — ABNORMAL HIGH (ref 0.61–1.24)
GFR calc Af Amer: 16 mL/min — ABNORMAL LOW (ref >60)
GFR calc non Af Amer: 14 mL/min — ABNORMAL LOW (ref >60)
GLUCOSE: 112 mg/dL — AB (ref 70–99)
Potassium: 3.7 mmol/L (ref 3.5–5.1)
Sodium: 142 mmol/L (ref 135–145)

## 2018-01-11 LAB — CBC
HCT: 31 % — ABNORMAL LOW (ref 40.0–52.0)
HEMOGLOBIN: 10.5 g/dL — AB (ref 13.0–18.0)
MCH: 30.3 pg (ref 26.0–34.0)
MCHC: 33.8 g/dL (ref 32.0–36.0)
MCV: 89.5 fL (ref 80.0–100.0)
Platelets: 146 10*3/uL — ABNORMAL LOW (ref 150–440)
RBC: 3.46 MIL/uL — ABNORMAL LOW (ref 4.40–5.90)
RDW: 17.5 % — ABNORMAL HIGH (ref 11.5–14.5)
WBC: 6.1 10*3/uL (ref 3.8–10.6)

## 2018-01-11 LAB — GLUCOSE, CAPILLARY
GLUCOSE-CAPILLARY: 75 mg/dL (ref 70–99)
GLUCOSE-CAPILLARY: 139 mg/dL — AB (ref 70–99)
GLUCOSE-CAPILLARY: 109 mg/dL — AB (ref 70–99)
Glucose-Capillary: 160 mg/dL — ABNORMAL HIGH (ref 70–99)

## 2018-01-11 MED ORDER — TEMAZEPAM 7.5 MG PO CAPS
7.5000 mg | ORAL_CAPSULE | Freq: Every evening | ORAL | Status: DC | PRN
  Administered 2018-01-12 – 2018-01-13 (×3): 7.5 mg via ORAL
  Filled 2018-01-11 (×5): qty 1

## 2018-01-11 NOTE — Evaluation (Signed)
Physical Therapy Evaluation Patient Details Name: Matthew [name] MRN: [id number] DOB: 1930/12/15 Today's Date: 01/11/2018   History of Present Illness  pt is a 84 y.o. male that was addmitted to the hospital from nephrology for dx of fluid overload. Pt is c/o SOB and dry cough. Pt PMH includes CHF, CAD, DM, HTN and previous MI.   Clinical Impression  Pt is a pleasant 84 year old male who was admitted for fluid overload. PT monitored HR and O2 saturation throughout session and both stayed [facility] during session. RPE was monitored and pt rated hard with mobility to and from commode. Pt performs bed mobility with supervision, transfers sit<>stand with supervision, and ambulates 20' with CGA/RW for saftey. See below for mobility details. Pt demonstrates deficits with functional mobility 2/2 fatigue, weakness, and unsteadiness. Pt Would benefit from skilled PT to address above deficits and promote optimal return to PLOF. PT recommends d/c to home with home health PT 2/2 pt roughly at baseline physically, but should continue to improve as medical condition improves.    Follow Up Recommendations Home health PT    Equipment Recommendations  None recommended by PT    Recommendations for Other Services       Precautions / Restrictions Precautions Precautions: Fall Restrictions Weight Bearing Restrictions: No      Mobility  Bed Mobility Overal bed mobility: Needs Assistance Bed Mobility: Sit to Supine       Sit to supine: Supervision;HOB elevated   General bed mobility comments: Pt benefited from supervision for IV managment  Transfers Overall transfer level: Needs assistance Equipment used: Rolling walker (2 wheeled) Transfers: Sit to/from Stand Sit to Stand: Supervision         General transfer comment: Pt benetied from supervision for IV management and use of arm rests/grab bars  Ambulation/Gait Ambulation/Gait assistance: Min guard Gait Distance (Feet): 20 Feet Assistive device:  Rolling walker (2 wheeled) Gait Pattern/deviations: Step-through pattern     General Gait Details: Pt required CGA/RW to ambulate 20' in room (10' to comode and 10' back to bed).  Pt had mild unsteadiness, and pt felt slower than normal.  Stairs            Wheelchair Mobility    Modified Rankin (Stroke Patients Only)       Balance Overall balance assessment: Needs assistance Sitting-balance support: No upper extremity supported;Feet unsupported Sitting balance-Leahy Scale: Good     Standing balance support: No upper extremity supported Standing balance-Leahy Scale: Good Standing balance comment: able to reach outside of BOS to pull up pants                             Pertinent Vitals/Pain Pain Assessment: No/denies pain    Home Living Family/patient expects to be discharged to:: Private residence Living Arrangements: Spouse/significant other;Children Available Help at Discharge: Family;Friend(s);Available PRN/intermittently Type of Home: House Home Access: Level entry     Home Layout: One level Home Equipment: Walker - 2 wheels;Cane - single point;Shower seat;Bedside commode Additional Comments: lives with 66 y.o. wife    Prior Function Level of Independence: Needs assistance   Gait / Transfers Assistance Needed: Mod I for last 2 weeks with RW, and prior to that [facility]  ADL's / Homemaking Assistance Needed: Pt Mod I with all ADLs, but needs transportation  Comments: Pt takes care of wife as well     Hand Dominance   Dominant Hand: Right    Extremity/Trunk Assessment  Upper Extremity Assessment Upper Extremity Assessment: Overall WFL for tasks assessed    Lower Extremity Assessment Lower Extremity Assessment: Overall WFL for tasks assessed       Communication   Communication: No difficulties  Cognition Arousal/Alertness: Awake/alert Behavior During Therapy: WFL for tasks assessed/performed Overall Cognitive Status: Within Functional  Limits for tasks assessed                                        General Comments      Exercises Other Exercises Other Exercises: Pt CGA to and from bathroom with verbal cueing and physical assist for safety.   Assessment/Plan    PT Assessment Patient needs continued PT services  PT Problem List Decreased strength;Decreased activity tolerance;Decreased range of motion;Decreased balance;Decreased mobility;Decreased coordination;Decreased knowledge of use of DME;Cardiopulmonary status limiting activity       PT Treatment Interventions DME instruction;Gait training;Functional mobility training;Therapeutic activities;Therapeutic exercise;Balance training;Neuromuscular re-education;Patient/family education    PT Goals (Current goals can be found in the Care Plan section)  Acute Rehab PT Goals Patient Stated Goal: go home PT Goal Formulation: With patient Time For Goal Achievement: 01/25/18 Potential to Achieve Goals: Good    Frequency Min 2X/week   Barriers to discharge Other (comment)(pt is taking care of 71 y.o. wife)      Co-evaluation               AM-PAC PT "6 Clicks" Daily Activity  Outcome Measure Difficulty turning over in bed (including adjusting bedclothes, sheets and blankets)?: A Little Difficulty moving from lying on back to sitting on the side of the bed? : A Little Difficulty sitting down on and standing up from a chair with arms (e.g., wheelchair, bedside commode, etc,.)?: A Little Help needed moving to and from a bed to chair (including a wheelchair)?: A Little Help needed walking in hospital room?: A Little Help needed climbing 3-5 steps with a railing? : A Lot 6 Click Score: 17    End of Session   Activity Tolerance: Patient limited by fatigue Patient left: in bed;with call bell/phone within reach;with bed alarm set;Other (comment)(with MD in room) Nurse Communication: Mobility status PT Visit Diagnosis: Unsteadiness on feet  (R26.81);Other abnormalities of gait and mobility (R26.89);History of falling (Z91.81);Difficulty in walking, not elsewhere classified (R26.2)    Time: 2536-6440 PT Time Calculation (min) (ACUTE ONLY): 24 min   Charges:              Rosario Adie, SPT   Rosario Adie 01/11/2018, 10:32 AM

## 2018-01-11 NOTE — Progress Notes (Signed)
[facility]  ROUNDING NOTE   Subjective:  Patient well-known to [place] as we follow him for chronic kidney disease stage IV. He was admitted for acute on chronic systolic heart failure exacerbation. There was 18 pound weight gain since May 2019. He was found to have significant lower extremity edema with weeping.   Objective:  Vital signs in last 24 hours:  Temp:  [97.5 F (36.4 C)-97.8 F (36.6 C)] 97.5 F (36.4 C) (08/21 0810) Pulse Rate:  [62-95] 71 (08/21 0810) Resp:  [17-20] 17 (08/21 0448) BP: (94-117)/(63-73) 117/73 (08/21 0810) SpO2:  [93 %-100 %] 100 % (08/21 0810) Weight:  [106 kg-106.1 kg] 106 kg (08/21 0448)  Weight change:  Filed Weights   01/10/18 1221 01/11/18 0448  Weight: 106.1 kg 106 kg    Intake/Output: I/O last 3 completed shifts: In: 341.1 [P.O.:240; I.V.:101.1] Out: 675 [Urine:675]   Intake/Output this shift:  No intake/output data recorded.  Physical Exam: General: No acute distress  Head: Normocephalic, atraumatic. Moist oral mucosal membranes  Eyes: Anicteric  Neck: Supple, trachea midline  Lungs:  Basilar rales, normal effort  Heart: S1S2 no rubs  Abdomen:  Soft, nontender, bowel sounds present  Extremities: 3+ peripheral edema.  Neurologic: Awake, alert, following commands  Skin: No lesions       Basic Metabolic Panel: Recent Labs  Lab 01/10/18 1247 01/11/18 0500  NA 142 142  K 3.6 3.7  CL 103 104  CO2 28 28  GLUCOSE 102* 112*  BUN 72* 78*  CREATININE 3.57* 3.61*  CALCIUM 8.9 9.0    Liver Function Tests: No results for input(s): AST, ALT, ALKPHOS, BILITOT, PROT, ALBUMIN in the last 168 hours. No results for input(s): LIPASE, AMYLASE in the last 168 hours. No results for input(s): AMMONIA in the last 168 hours.  CBC: Recent Labs  Lab 01/10/18 1247 01/11/18 0500  WBC 5.8 6.1  HGB 10.7* 10.5*  HCT 31.4* 31.0*  MCV 89.1 89.5  PLT 141* 146*    Cardiac Enzymes: No results for input(s): CKTOTAL, CKMB,  CKMBINDEX, TROPONINI in the last 168 hours.  BNP: Invalid input(s): POCBNP  CBG: Recent Labs  Lab 01/10/18 1310 01/10/18 1654 01/10/18 2134 01/11/18 0811  GLUCAP 94 129* 140* 75    Microbiology: No results found for this or any previous visit.  Coagulation Studies: No results for input(s): LABPROT, INR in the last 72 hours.  Urinalysis: No results for input(s): COLORURINE, LABSPEC, PHURINE, GLUCOSEU, HGBUR, BILIRUBINUR, KETONESUR, PROTEINUR, UROBILINOGEN, NITRITE, LEUKOCYTESUR in the last 72 hours.  Invalid input(s): APPERANCEUR    Imaging: Dg Chest 2 View  Result Date: 01/10/2018 CLINICAL DATA:  Weight gain. EXAM: CHEST - 2 VIEW COMPARISON:  09/20/2017. FINDINGS: Prior CABG. Pulmonary venous congestion bilateral interstitial prominence consistent CHF. Small left pleural effusion. No pneumothorax. IMPRESSION: Prior CABG. Cardiomegaly with bilateral interstitial prominence noted consistent CHF. Small left pleural effusion. Electronically Signed   By: Marcello Moores  Register   On: 01/10/2018 15:05     Medications:   . furosemide (LASIX) infusion 6 mg/hr (01/10/18 1412)   . amiodarone  200 mg Oral Daily  . aspirin EC  81 mg Oral QHS  . atorvastatin  40 mg Oral QHS  . budesonide (PULMICORT) nebulizer solution  0.5 mg Nebulization BID  . carvedilol  12.5 mg Oral BID WC  . cholecalciferol  2,000 Units Oral Q breakfast  . clopidogrel  75 mg Oral Q breakfast  . dextromethorphan  30 mg Oral BID   And  . guaiFENesin  600 mg Oral BID  . enoxaparin (LOVENOX) injection  30 mg Subcutaneous Q24H  . fluticasone  2 spray Each Nare QHS  . insulin aspart  0-5 Units Subcutaneous QHS  . insulin aspart  0-9 Units Subcutaneous TID WC  . ipratropium-albuterol  3 mL Nebulization Q6H  . losartan  100 mg Oral Daily  . multivitamin with minerals  1 tablet Oral Q breakfast  . omega-3 acid ethyl esters  4,000 mg Oral Q1200  . potassium chloride  40 mEq Oral Daily   acetaminophen **OR**  acetaminophen, nitroGLYCERIN, ondansetron **OR** ondansetron (ZOFRAN) IV  Assessment/ Plan:  84 y.o. male with PMHx of coronary artery disease status post 5 vessel CABG with PTCA x 2 11/2013, diabetes mellitus, hypertension, hyperlipidemia, cholecystectomy, tongue lesion resection, GERD   1.  Acute renal failure/chronic kidney disease stage IV.  His creatinine is a bit higher than his prior baseline and currently 3.6 with an EGFR 14.  He has been started on a Lasix drip.  If his renal function continues to decline we may need to consider temporary renal placement therapy.  Patient and his daughter were informed of this.  We will obtain a renal ultrasound to make sure there is no underlying obstruction as well.  2.  Acute on chronic systolic heart failure exacerbation.  18 pound weight gain over the past several months.  Significant lower extremity edema as well as pulmonary edema.  Repeat 2D echocardiogram performed.  Continue Lasix drip.  3.  Anemia of chronic kidney disease.  Hemoglobin currently 10.5.  Hold off on Epogen at this time.  4.  Further plan as patient progresses.    LOS: 1 Kiel Cockerell 8/21/201911:57 AM

## 2018-01-11 NOTE — Care Management Note (Signed)
Case Management Note  Patient Details  Name: Rayven Hendrickson MRN: [id number] Date of Birth: [birth year]  Subjective/Objective:                 Direct admit by hospitalist for nephrology.  Patient with fluid overload with approximately 20 pound weight gain since  may.  Is followed by nephrology as outpatient.  Patient is on room air. Lasix drip   Action/Plan:   Expected Discharge Date:  01/14/18               Expected Discharge Plan:     In-House Referral:     Discharge planning Services     Post Acute Care Choice:    Choice offered to:     DME Arranged:    DME Agency:     HH Arranged:    HH Agency:     Status of Service:     If discussed at [employer], dates discussed:    Additional Comments:  Katrina Stack, RN 01/11/2018, 6:15 PM

## 2018-01-11 NOTE — Plan of Care (Signed)
  Problem: Education: Goal: Ability to demonstrate management of disease process will improve Outcome: Progressing Goal: Ability to verbalize understanding of medication therapies will improve Outcome: Progressing   Problem: Activity: Goal: Capacity to carry out activities will improve Outcome: Progressing   Problem: Health Behavior/Discharge Planning: Goal: Ability to manage health-related needs will improve Outcome: Progressing   Problem: Clinical Measurements: Goal: Will remain free from infection Outcome: Progressing

## 2018-01-11 NOTE — Progress Notes (Signed)
Does not want insulin coverage but does  want fingersticks checked.

## 2018-01-11 NOTE — Plan of Care (Signed)
  Problem: Clinical Measurements: Goal: Respiratory complications will improve Outcome: Progressing   Problem: Activity: Goal: Risk for activity intolerance will decrease Outcome: Progressing Note:  Up in room to chair   Problem: Pain Managment: Goal: General experience of comfort will improve Outcome: Progressing   Problem: Clinical Measurements: Goal: Diagnostic test results will improve Outcome: Not Progressing Note:  BUN/ creatinine increasing- hx of CKD   Problem: Education: Goal: Knowledge of General Education information will improve Description Including pain rating scale, medication(s)/side effects and non-pharmacologic comfort measures Outcome: Completed/Met

## 2018-01-11 NOTE — Progress Notes (Signed)
[place] [facility] [place] NAME: Quentez Lober    MR#:  [id number]  DATE OF BIRTH:  1931/05/17  SUBJECTIVE: Patient is seen at bedside, admitted for worsening renal failure along with weight gain, started on Lasix drip.  Patient lost about 4 pounds since yesterday, overall he feels better.  CHIEF COMPLAINT:  No chief complaint on file.   REVIEW OF SYSTEMS:   ROS CONSTITUTIONAL: No fever, fatigue or weakness.  EYES: No blurred or double vision.  EARS, NOSE, AND THROAT: No tinnitus or ear pain.  RESPIRATORY: Some cough but no shortness of breath.  CARDIOVASCULAR: No chest pain, orthopnea, edema.  GASTROINTESTINAL: No nausea, vomiting, diarrhea or abdominal pain.  GENITOURINARY: No dysuria, hematuria.  ENDOCRINE: No polyuria, nocturia,  HEMATOLOGY: No anemia, easy bruising or bleeding SKIN: No rash or lesion. MUSCULOSKELETAL: No joint pain or arthritis.   NEUROLOGIC: No tingling, numbness, weakness.  PSYCHIATRY: No anxiety or depression.   DRUG ALLERGIES:   Allergies  Allergen Reactions  . Amoxicillin Diarrhea    VITALS:  Blood pressure 117/73, pulse 71, temperature (!) 97.5 F (36.4 C), temperature source Oral, resp. rate 17, height 6' (1.829 m), weight 106 kg, SpO2 100 %.  PHYSICAL EXAMINATION:  GENERAL:  84 y.o.-year-old patient lying in the bed with no acute distress.  EYES: Pupils equal, round, reactive to light and accommodation. No scleral icterus. Extraocular muscles intact.  HEENT: Head atraumatic, normocephalic. Oropharynx and nasopharynx clear.  NECK:  Supple, no jugular venous distention. No thyroid enlargement, no tenderness.  LUNGS: Normal breath sounds bilaterally, no wheezing, rales,rhonchi or crepitation. No use of accessory muscles of respiration.  CARDIOVASCULAR: S1, S2 normal. No murmurs, rubs, or gallops.  ABDOMEN: Soft, nontender, nondistended. Bowel sounds present. No organomegaly or mass.  EXTREMITIES: No  pedal edema, cyanosis, or clubbing.  NEUROLOGIC: Cranial nerves II through XII are intact. Muscle strength 5/5 in all extremities. Sensation intact. Gait not checked.  PSYCHIATRIC: The patient is alert and oriented x 3.  SKIN: No obvious rash, lesion, or ulcer.    LABORATORY PANEL:   CBC Recent Labs  Lab 01/11/18 0500  WBC 6.1  HGB 10.5*  HCT 31.0*  PLT 146*   ------------------------------------------------------------------------------------------------------------------  Chemistries  Recent Labs  Lab 01/11/18 0500  NA 142  K 3.7  CL 104  CO2 28  GLUCOSE 112*  BUN 78*  CREATININE 3.61*  CALCIUM 9.0   ------------------------------------------------------------------------------------------------------------------  Cardiac Enzymes No results for input(s): TROPONINI in the last 168 hours. ------------------------------------------------------------------------------------------------------------------  RADIOLOGY:  Dg Chest 2 View  Result Date: 01/10/2018 CLINICAL DATA:  Weight gain. EXAM: CHEST - 2 VIEW COMPARISON:  09/20/2017. FINDINGS: Prior CABG. Pulmonary venous congestion bilateral interstitial prominence consistent CHF. Small left pleural effusion. No pneumothorax. IMPRESSION: Prior CABG. Cardiomegaly with bilateral interstitial prominence noted consistent CHF. Small left pleural effusion. Electronically Signed   By: Marcello Moores  Register   On: 01/10/2018 15:05    EKG:   Orders placed or performed during the hospital encounter of 01/10/18  . EKG 12-Lead  . EKG 12-Lead    ASSESSMENT AND PLAN:  84 year old male patient with history of CAD status post bypass surgery, htn,HLP, diabetes mellitus type 2 comes in because of worsening leg edema along with worsening renal failure center from Dr. Elwyn Lade office directly. #1 .acute renal failure on chronic kidney disease stage IV: Patient creatinine is up around 3.6, normal creatinine is around 2.5.  Continue Lasix drip,  monitor kidney function closely, check daily weights,  3..Acute on chronic systolic heart failure exacerbation.  18 pound weight gain over the past several months.  Significant lower extremity edema as well as pulmonary edema.  2 decho;showed EF 45%;continue  Bblocker,,ARB, 4.h/o CAD s/p CABG;continue ASA,plavix,bblocker,statins 5.History of arrhythmia on amiodarone and Coreg.  Aspirin and Plavix All the records are reviewed and case discussed with Care Management/Social Workerr. Management plans discussed with the patient, family and they are in agreement.  CODE STATUS: DNR  TOTAL TIME TAKING CARE OF THIS PATIENT:35 min More than 50% time spent in counseling and coordination of care..  Several days of IV Lasix, discussed with patient daughter at bedside.  DEPENDING ON CLINICAL CONDITION.   Epifanio Lesches M.D on 01/11/2018 at 1:47 PM  Between 7am to 6pm - Pager - [phone]  After 6pm go to [email or web address] - password [email or web address] [facility]  Office  [phone]  CC: Primary care physician; Jodi Marble, MD   Note: This dictation was prepared with Dragon dictation along with smaller phrase technology. Any transcriptional errors that result from this process are unintentional.

## 2018-01-11 NOTE — Progress Notes (Addendum)
Per pt request he wants to check v/s at 11:30. Then at 5am.Will continue to monitor. He also wants ambien for sleep. Page prime. Will continue to monitor.  Update 1854: Doctor Pyreddy place pt on restoril 7.5 mg oral at bedtime PRN. Will cotninue to monitor.  Update 2327: Pt refused his 0200 breathing treatment to get more sleep. Respiratory was notified. Will continue to monitor.

## 2018-01-12 LAB — GLUCOSE, CAPILLARY
GLUCOSE-CAPILLARY: 80 mg/dL (ref 70–99)
GLUCOSE-CAPILLARY: 101 mg/dL — AB (ref 70–99)
GLUCOSE-CAPILLARY: 148 mg/dL — AB (ref 70–99)
Glucose-Capillary: 65 mg/dL — ABNORMAL LOW (ref 70–99)
Glucose-Capillary: 122 mg/dL — ABNORMAL HIGH (ref 70–99)
Glucose-Capillary: 140 mg/dL — ABNORMAL HIGH (ref 70–99)

## 2018-01-12 MED ORDER — IPRATROPIUM-ALBUTEROL 0.5-2.5 (3) MG/3ML IN SOLN
3.0000 mL | Freq: Two times a day (BID) | RESPIRATORY_TRACT | Status: DC
  Administered 2018-01-12 – 2018-01-15 (×6): 3 mL via RESPIRATORY_TRACT
  Filled 2018-01-12 (×6): qty 3

## 2018-01-12 NOTE — Progress Notes (Signed)
Hypoglycemic Event  CBG: 65  Treatment: 4 oz orange juice  Symptom: asymptomatic  Follow-up CBG: [id number]:5910 CBG Result:80  Possible Reasons for Event:unknown  Comments/MD notified yes    Margarita Mail

## 2018-01-12 NOTE — Care Management Note (Signed)
Case Management Note  Patient Details  Name: Matthew [name] MRN: [id number] Date of Birth: 1930/11/12  Subjective/Objective:    RNCM consulted on patient as there are Home health recommendations from PT. Daughter is at bedside and tells me that patient lives with his spouse [name] [phone] but she is not able to provide any level of assistance. Per daughter multiple family members check in often and assist as needed. Patient uses a walker, bedside commode, and shower chair in the home and there is no need for any other equipment. Given choice family has selected [facility] for services. Heads up referral placed with [name] from [place] who will accept the patient at discharge.              Ines Bloomer RN BSN RNCM [phone]   Action/Plan:   Expected Discharge Date:  01/14/18               Expected Discharge Plan:     In-House Referral:     Discharge planning Services     Post Acute Care Choice:    Choice offered to:     DME Arranged:    DME Agency:     HH Arranged:    HH Agency:     Status of Service:     If discussed at [employer], dates discussed:    Additional Comments:  Latanya Maudlin, RN 01/12/2018, 11:43 AM

## 2018-01-12 NOTE — Progress Notes (Signed)
Physical Therapy Treatment Patient Details Name: Matthew [name] MRN: [id number] DOB: 01-31-1931 Today's Date: 01/12/2018    History of Present Illness pt is a 84 y.o. male that was addmitted to the hospital from nephrology for dx of fluid overload. Pt is c/o SOB and dry cough. Pt PMH includes CHF, CAD, DM, HTN and previous MI.     PT Comments    Pt is able to fully circumambulate the nurses station w/o rest break, he did have some fatigue with the effort but O2 remained in the high 90s and he was confident t/o the effort.  Overall pt showed considerable increase in gait tolerance and showed that he will be able to mange at home at this point.  Pt still with swelling in LEs and generally but appears to be improving in this as well.  Encouraged pt to do light exercises especially ankle pumps.   Follow Up Recommendations  Home health PT     Equipment Recommendations  None recommended by PT    Recommendations for Other Services       Precautions / Restrictions Precautions Precautions: Fall Restrictions Weight Bearing Restrictions: No    Mobility  Bed Mobility               General bed mobility comments: Pt in recliner on arrival, not tested  Transfers Overall transfer level: Independent Equipment used: Rolling walker (2 wheeled) Transfers: Sit to/from Stand Sit to Stand: Independent         General transfer comment: Pt did well with set up and positioning for transition to and from standing, no phyiscal assist  Ambulation/Gait Ambulation/Gait assistance: Modified independent (Device/Increase time) Gait Distance (Feet): 200 Feet Assistive device: Rolling walker (2 wheeled)       General Gait Details: Pt did well with ambulation and showed good confidence and safety.  His O2 remained in the high 90s on room air with HR increased from 70s to 90s with the effort.  Pt clearly fatigued by the end of the effort but ultimately safe and consistent   Stairs              Wheelchair Mobility    Modified Rankin (Stroke Patients Only)       Balance Overall balance assessment: Modified Independent   Sitting balance-Leahy Scale: Good       Standing balance-Leahy Scale: Good                              Cognition Arousal/Alertness: Awake/alert Behavior During Therapy: WFL for tasks assessed/performed Overall Cognitive Status: Within Functional Limits for tasks assessed                                        Exercises General Exercises - Lower Extremity Ankle Circles/Pumps: AROM;10 reps Long Arc Quad: Strengthening;10 reps Heel Slides: Strengthening;10 reps Hip ABduction/ADduction: Strengthening;10 reps Hip Flexion/Marching: Strengthening;10 reps    General Comments        Pertinent Vitals/Pain Pain Assessment: No/denies pain    Home Living                      Prior Function            PT Goals (current goals can now be found in the care plan section) Progress towards PT goals: Progressing toward goals    Frequency  Min 2X/week      PT Plan Current plan remains appropriate    Co-evaluation              AM-PAC PT "6 Clicks" Daily Activity  Outcome Measure  Difficulty turning over in bed (including adjusting bedclothes, sheets and blankets)?: None Difficulty moving from lying on back to sitting on the side of the bed? : None Difficulty sitting down on and standing up from a chair with arms (e.g., wheelchair, bedside commode, etc,.)?: None Help needed moving to and from a bed to chair (including a wheelchair)?: None Help needed walking in hospital room?: None Help needed climbing 3-5 steps with a railing? : A Little 6 Click Score: 23    End of Session Equipment Utilized During Treatment: Gait belt Activity Tolerance: Patient tolerated treatment well Patient left: in chair;with call bell/phone within reach;with family/visitor present Nurse Communication: Mobility  status PT Visit Diagnosis: Difficulty in walking, not elsewhere classified (R26.2);Unsteadiness on feet (R26.81)     Time: 3354-5625 PT Time Calculation (min) (ACUTE ONLY): 25 min  Charges:  $Gait Training: 8-22 mins $Therapeutic Exercise: 8-22 mins                     Kreg Shropshire, DPT 01/12/2018, 5:26 PM

## 2018-01-12 NOTE — Progress Notes (Signed)
[facility] [place] NAME: Matthew [name]    MR#:  [id number]  DATE OF BIRTH:  [birth year]  SUBJECTIVE: Still on Lasix drip.  Denies shortness of breath.  Urine output about 1.8 L yesterday, weight is down 3 kg.Marland Kitchen  CHIEF COMPLAINT:  No chief complaint on file.   REVIEW OF SYSTEMS:   ROS CONSTITUTIONAL: No fever, fatigue or weakness.  EYES: No blurred or double vision.  EARS, NOSE, AND THROAT: No tinnitus or ear pain.  RESPIRATORY: Some cough but no shortness of breath.  CARDIOVASCULAR: No chest pain, orthopnea, edema.  GASTROINTESTINAL: No nausea, vomiting, diarrhea or abdominal pain.  GENITOURINARY: No dysuria, hematuria.  ENDOCRINE: No polyuria, nocturia,  HEMATOLOGY: No anemia, easy bruising or bleeding SKIN: No rash or lesion. MUSCULOSKELETAL: No joint pain or arthritis.   NEUROLOGIC: No tingling, numbness, weakness.  PSYCHIATRY: No anxiety or depression.   DRUG ALLERGIES:   Allergies  Allergen Reactions  . Amoxicillin Diarrhea    VITALS:  Blood pressure 104/70, pulse 78, temperature (!) 97.5 F (36.4 C), temperature source Oral, resp. rate 17, height 6' (1.829 m), weight 103.7 kg, SpO2 99 %.  PHYSICAL EXAMINATION:  GENERAL:  84 y.o.-year-old patient lying in the bed with no acute distress.  Patient has anasarca. EYES: Pupils equal, round, reactive to light and accommodation. No scleral icterus. Extraocular muscles intact.  HEENT: Head atraumatic, normocephalic. Oropharynx and nasopharynx clear.  NECK:  Supple, no jugular venous distention. No thyroid enlargement, no tenderness.  LUNGS: Normal breath sounds bilaterally, no wheezing, rales,rhonchi or crepitation. No use of accessory muscles of respiration.  CARDIOVASCULAR: S1, S2 normal. No murmurs, rubs, or gallops.  ABDOMEN: Soft, nontender, nondistended. Bowel sounds present. No organomegaly or mass.  EXTREMITIES: 2+ pitting edema present nEUROLOGIC: Cranial nerves II  through XII are intact. Muscle strength 5/5 in all extremities. Sensation intact. Gait not checked.  PSYCHIATRIC: The patient is alert and oriented x 3.  SKIN: No obvious rash, lesion, or ulcer.    LABORATORY PANEL:   CBC Recent Labs  Lab 01/11/18 0500  WBC 6.1  HGB 10.5*  HCT 31.0*  PLT 146*   ------------------------------------------------------------------------------------------------------------------  Chemistries  Recent Labs  Lab 01/11/18 0500  NA 142  K 3.7  CL 104  CO2 28  GLUCOSE 112*  BUN 78*  CREATININE 3.61*  CALCIUM 9.0   ------------------------------------------------------------------------------------------------------------------  Cardiac Enzymes No results for input(s): TROPONINI in the last 168 hours. ------------------------------------------------------------------------------------------------------------------  RADIOLOGY:  No results found.  EKG:   Orders placed or performed during the hospital encounter of 01/10/18  . EKG 12-Lead  . EKG 12-Lead    ASSESSMENT AND PLAN:  84 year old male patient with history of CAD status post bypass surgery, htn,HLP, diabetes mellitus type 2 comes in because of worsening leg edema along with worsening renal failure center from Dr. Elwyn Lade office directly. #1 .acute renal failure on chronic kidney disease stage IV: Patient creatinine is up around 3.6, normal creatinine is around 2.4.  Continue Lasix drip today, creatinine is slightly worse today than yesterday.  ,   2.5.  Continue Lasix drip, monitor kidney function closely, check daily weights, 3..Acute on chronic systolic heart failure exacerbation.  18 pound weight gain over the past several months.  Significant lower extremity edema as well as pulmonary edema.  2 decho;showed EF 45%;continue  Bblocker,,ARB, 4.h/o CAD s/p CABG;continue ASA,plavix,bblocker,statins 5.History of arrhythmia on amiodarone and Coreg.  Aspirin and Plavix All the records are  reviewed and case  discussed with Care Management/Social Workerr. Management plans discussed with the patient, family and they are in agreement.  CODE STATUS: DNR  TOTAL TIME TAKING CARE OF THIS PATIENT:35 min More than 50% time spent in counseling and coordination of care..  Several days of IV Lasix, discussed with patient daughter at bedside.  DEPENDING ON CLINICAL CONDITION.   Epifanio Lesches M.D on 01/12/2018 at 5:48 PM  Between 7am to 6pm - Pager - [phone]  After 6pm go to [email or web address] - password [email or web address] [facility]  Office  [phone]  CC: Primary care physician; Jodi Marble, MD   Note: This dictation was prepared with Dragon dictation along with smaller phrase technology. Any transcriptional errors that result from this process are unintentional.

## 2018-01-12 NOTE — Progress Notes (Signed)
[facility]  ROUNDING NOTE   Subjective:  Patient reports that he is starting to feel somewhat better. Urine output yesterday recorded was 1.8 L however suspect that the urine output was greater than this as his weight loss was almost 3 kg.    Objective:  Vital signs in last 24 hours:  Temp:  [97.4 F (36.3 C)-97.9 F (36.6 C)] 97.6 F (36.4 C) (08/22 0746) Pulse Rate:  [77-99] 85 (08/22 0746) BP: (103-118)/(59-83) 118/83 (08/22 0746) SpO2:  [93 %-100 %] 100 % (08/22 0746) Weight:  [103.7 kg] 103.7 kg (08/22 0442)  Weight change: -2.449 kg Filed Weights   01/10/18 1221 01/11/18 0448 01/12/18 0442  Weight: 106.1 kg 106 kg 103.7 kg    Intake/Output: I/O last 3 completed shifts: In: 384.5 [P.O.:240; I.V.:144.5] Out: 2705 [Urine:2705]   Intake/Output this shift:  Total I/O In: 241 [P.O.:241] Out: 200 [Urine:200]  Physical Exam: General: No acute distress  Head: Normocephalic, atraumatic. Moist oral mucosal membranes  Eyes: Anicteric  Neck: Supple, trachea midline  Lungs:  Basilar rales, normal effort  Heart: S1S2 no rubs  Abdomen:  Soft, nontender, bowel sounds present  Extremities: 3+ peripheral edema.  Neurologic: Awake, alert, following commands  Skin: No lesions       Basic Metabolic Panel: Recent Labs  Lab 01/10/18 1247 01/11/18 0500  NA 142 142  K 3.6 3.7  CL 103 104  CO2 28 28  GLUCOSE 102* 112*  BUN 72* 78*  CREATININE 3.57* 3.61*  CALCIUM 8.9 9.0    Liver Function Tests: No results for input(s): AST, ALT, ALKPHOS, BILITOT, PROT, ALBUMIN in the last 168 hours. No results for input(s): LIPASE, AMYLASE in the last 168 hours. No results for input(s): AMMONIA in the last 168 hours.  CBC: Recent Labs  Lab 01/10/18 1247 01/11/18 0500  WBC 5.8 6.1  HGB 10.7* 10.5*  HCT 31.4* 31.0*  MCV 89.1 89.5  PLT 141* 146*    Cardiac Enzymes: No results for input(s): CKTOTAL, CKMB, CKMBINDEX, TROPONINI in the last 168  hours.  BNP: Invalid input(s): POCBNP  CBG: Recent Labs  Lab 01/11/18 1155 01/11/18 1648 01/11/18 2026 01/12/18 0747 01/12/18 0823  GLUCAP 139* 109* 160* 65* 80    Microbiology: No results found for this or any previous visit.  Coagulation Studies: No results for input(s): LABPROT, INR in the last 72 hours.  Urinalysis: No results for input(s): COLORURINE, LABSPEC, PHURINE, GLUCOSEU, HGBUR, BILIRUBINUR, KETONESUR, PROTEINUR, UROBILINOGEN, NITRITE, LEUKOCYTESUR in the last 72 hours.  Invalid input(s): APPERANCEUR    Imaging: Dg Chest 2 View  Result Date: 01/10/2018 CLINICAL DATA:  Weight gain. EXAM: CHEST - 2 VIEW COMPARISON:  09/20/2017. FINDINGS: Prior CABG. Pulmonary venous congestion bilateral interstitial prominence consistent CHF. Small left pleural effusion. No pneumothorax. IMPRESSION: Prior CABG. Cardiomegaly with bilateral interstitial prominence noted consistent CHF. Small left pleural effusion. Electronically Signed   By: Marcello Moores  Register   On: 01/10/2018 15:05     Medications:   . furosemide (LASIX) infusion 6 mg/hr (01/12/18 0916)   . amiodarone  200 mg Oral Daily  . aspirin EC  81 mg Oral QHS  . atorvastatin  40 mg Oral QHS  . budesonide (PULMICORT) nebulizer solution  0.5 mg Nebulization BID  . carvedilol  12.5 mg Oral BID WC  . cholecalciferol  2,000 Units Oral Q breakfast  . clopidogrel  75 mg Oral Q breakfast  . dextromethorphan  30 mg Oral BID   And  . guaiFENesin  600 mg Oral  BID  . enoxaparin (LOVENOX) injection  30 mg Subcutaneous Q24H  . fluticasone  2 spray Each Nare QHS  . insulin aspart  0-5 Units Subcutaneous QHS  . ipratropium-albuterol  3 mL Nebulization BID  . losartan  100 mg Oral Daily  . multivitamin with minerals  1 tablet Oral Q breakfast  . omega-3 acid ethyl esters  4,000 mg Oral Q1200  . potassium chloride  40 mEq Oral Daily   acetaminophen **OR** acetaminophen, nitroGLYCERIN, ondansetron **OR** ondansetron (ZOFRAN) IV,  temazepam  Assessment/ Plan:  84 y.o. male with PMHx of coronary artery disease status post 5 vessel CABG with PTCA x 2 11/2013, diabetes mellitus, hypertension, hyperlipidemia, cholecystectomy, tongue lesion resection, GERD   1.  Acute renal failure/chronic kidney disease stage IV.  No new renal function testing this a.m.  We will go ahead and order repeat renal parameters today.  2.  Acute on chronic systolic heart failure exacerbation.  Patient continues to have significant volume overload.  Therefore we will maintain the patient on Lasix drip at 6 mg/h.  3.  Anemia of chronic kidney disease.  Last hemoglobin was 10.5.  No indication for Procrit at the moment.      LOS: 2 Alyric Parkin 8/22/201910:30 AM

## 2018-01-13 LAB — BASIC METABOLIC PANEL
Anion gap: 11 (ref 5–15)
BUN: 86 mg/dL — ABNORMAL HIGH (ref 8–23)
CALCIUM: 9.1 mg/dL (ref 8.9–10.3)
CO2: 30 mmol/L (ref 22–32)
CREATININE: 3.48 mg/dL — AB (ref 0.61–1.24)
Chloride: 100 mmol/L (ref 98–111)
GFR, EST AFRICAN AMERICAN: 17 mL/min — AB (ref >60)
GFR, EST NON AFRICAN AMERICAN: 15 mL/min — AB (ref >60)
Glucose, Bld: 106 mg/dL — ABNORMAL HIGH (ref 70–99)
Potassium: 3.8 mmol/L (ref 3.5–5.1)
SODIUM: 141 mmol/L (ref 135–145)

## 2018-01-13 LAB — GLUCOSE, CAPILLARY
GLUCOSE-CAPILLARY: 114 mg/dL — AB (ref 70–99)
Glucose-Capillary: 79 mg/dL (ref 70–99)
Glucose-Capillary: 120 mg/dL — ABNORMAL HIGH (ref 70–99)
Glucose-Capillary: 124 mg/dL — ABNORMAL HIGH (ref 70–99)

## 2018-01-13 NOTE — Progress Notes (Signed)
[facility] [place] NAME: Matthew [name]    MR#:  [id number]  DATE OF BIRTH:  Aug 11, 1930  SUBJECTIVE: Still on Lasix drip.  Denies shortness of breath.  Urine output around 2.4 L yesterday, .  CHIEF COMPLAINT:  No chief complaint on file.   REVIEW OF SYSTEMS:   ROS CONSTITUTIONAL: No fever, fatigue or weakness.  EYES: No blurred or double vision.  EARS, NOSE, AND THROAT: No tinnitus or ear pain.  RESPIRATORY: Some cough but no shortness of breath.  CARDIOVASCULAR: No chest pain, orthopnea, edema.  GASTROINTESTINAL: No nausea, vomiting, diarrhea or abdominal pain.  GENITOURINARY: No dysuria, hematuria.  ENDOCRINE: No polyuria, nocturia,  HEMATOLOGY: No anemia, easy bruising or bleeding SKIN: No rash or lesion. MUSCULOSKELETAL: No joint pain or arthritis.   NEUROLOGIC: No tingling, numbness, weakness.  PSYCHIATRY: No anxiety or depression.   DRUG ALLERGIES:   Allergies  Allergen Reactions  . Amoxicillin Diarrhea    VITALS:  Blood pressure 106/74, pulse 79, temperature 98.4 F (36.9 C), temperature source Oral, resp. rate 18, height 6' (1.829 m), weight 103 kg, SpO2 97 %.  PHYSICAL EXAMINATION:  GENERAL:  84 y.o.-year-old patient lying in the bed with no acute distress.  Patient has anasarca. EYES: Pupils equal, round, reactive to light and accommodation. No scleral icterus. Extraocular muscles intact.  HEENT: Head atraumatic, normocephalic. Oropharynx and nasopharynx clear.  NECK:  Supple, no jugular venous distention. No thyroid enlargement, no tenderness.  LUNGS: Normal breath sounds bilaterally, no wheezing, rales,rhonchi or crepitation. No use of accessory muscles of respiration.  CARDIOVASCULAR: S1, S2 normal. No murmurs, rubs, or gallops.  ABDOMEN: Soft, nontender, nondistended. Bowel sounds present. No organomegaly or mass.  EXTREMITIES: 2+ pitting edema present nEUROLOGIC: Cranial nerves II through XII are intact.  Muscle strength 5/5 in all extremities. Sensation intact. Gait not checked.  PSYCHIATRIC: The patient is alert and oriented x 3.  SKIN: No obvious rash, lesion, or ulcer.    LABORATORY PANEL:   CBC Recent Labs  Lab 01/11/18 0500  WBC 6.1  HGB 10.5*  HCT 31.0*  PLT 146*   ------------------------------------------------------------------------------------------------------------------  Chemistries  Recent Labs  Lab 01/13/18 0418  NA 141  K 3.8  CL 100  CO2 30  GLUCOSE 106*  BUN 86*  CREATININE 3.48*  CALCIUM 9.1   ------------------------------------------------------------------------------------------------------------------  Cardiac Enzymes No results for input(s): TROPONINI in the last 168 hours. ------------------------------------------------------------------------------------------------------------------  RADIOLOGY:  No results found.  EKG:   Orders placed or performed during the hospital encounter of 01/10/18  . EKG 12-Lead  . EKG 12-Lead    ASSESSMENT AND PLAN:  84 year old male patient with history of CAD status post bypass surgery, htn,HLP, diabetes mellitus type 2 comes in because of worsening leg edema along with worsening renal failure center from Dr. Elwyn Lade office directly. #1 .acute renal failure on chronic kidney disease stage IV:  normal creatinine is around 2.4.  Continue Lasix drip, patient renal function is almost stable, creatinine slightly decreased to 3.48 today.     2.5.  Continue Lasix drip, monitor kidney function closely, check daily weights,  3..Acute on chronic systolic heart failure exacerbation.  18 pound weight gain over the past several months.  Significant lower extremity edema as well as pulmonary edema.  2 decho;showed EF 45%;continue  Bblocker,,ARB,  4.h/o CAD s/p CABG;continue ASA,plavix,bblocker,statins  5.History of arrhythmia on amiodarone and Coreg.  Aspirin and Plavix. #6 possible discharge with home health  physical therapy when ready.  All the records are reviewed and case discussed with Care Management/Social Workerr. Management plans discussed with the patient, family and they are in agreement.  CODE STATUS: DNR  TOTAL TIME TAKING CARE OF THIS PATIENT:35 min More than 50% time spent in counseling and coordination of care..  Several days of IV Lasix, discussed with patient  Son in law today.  DEPENDING ON CLINICAL CONDITION.   Epifanio Lesches M.D on 01/13/2018 at 2:00 PM  Between 7am to 6pm - Pager - [phone]  After 6pm go to [email or web address] - password [email or web address] [facility]  Office  [phone]  CC: Primary care physician; Jodi Marble, MD   Note: This dictation was prepared with Dragon dictation along with smaller phrase technology. Any transcriptional errors that result from this process are unintentional.

## 2018-01-13 NOTE — Care Management Important Message (Signed)
Important Message  Patient Details  Name: Matthew [name] MRN: [id number] Date of Birth: 1931/05/17   Medicare Important Message Given:  Yes    Juliann Pulse A Mostyn Varnell 01/13/2018, 11:44 AM

## 2018-01-13 NOTE — Progress Notes (Signed)
[facility]  ROUNDING NOTE   Subjective:  Patient seen at bedside. Lower extremity edema slowly improving. Patient has good urine output. However renal function overall remains diminished.    Objective:  Vital signs in last 24 hours:  Temp:  [97.6 F (36.4 C)-98.7 F (37.1 C)] 98.7 F (37.1 C) (08/23 1634) Pulse Rate:  [64-86] 84 (08/23 1634) Resp:  [16-18] 18 (08/23 1634) BP: (92-109)/(55-77) 108/77 (08/23 1634) SpO2:  [72 %-98 %] 98 % (08/23 1634) Weight:  [103 kg] 103 kg (08/23 0500)  Weight change: -0.726 kg Filed Weights   01/11/18 0448 01/12/18 0442 01/13/18 0500  Weight: 106 kg 103.7 kg 103 kg    Intake/Output: I/O last 3 completed shifts: In: 193 [P.O.:481; I.V.:216] Out: 3905 [Urine:3905]   Intake/Output this shift:  Total I/O In: 231.2 [P.O.:200; I.V.:31.2] Out: 1000 [Urine:1000]  Physical Exam: General: No acute distress  Head: Normocephalic, atraumatic. Moist oral mucosal membranes  Eyes: Anicteric  Neck: Supple, trachea midline  Lungs:  Basilar rales, normal effort  Heart: S1S2 no rubs  Abdomen:  Soft, nontender, bowel sounds present  Extremities: 2+ peripheral edema.  Neurologic: Awake, alert, following commands  Skin: No lesions       Basic Metabolic Panel: Recent Labs  Lab 01/10/18 1247 01/11/18 0500 01/13/18 0418  NA 142 142 141  K 3.6 3.7 3.8  CL 103 104 100  CO2 _0 GLUCOSE 102* 112* 106*  BUN 72* 78* 86*  CREATININE 3.57* 3.61* 3.48*  CALCIUM 8.9 9.0 9.1    Liver Function Tests: No results for input(s): AST, ALT, ALKPHOS, BILITOT, PROT, ALBUMIN in the last 168 hours. No results for input(s): LIPASE, AMYLASE in the last 168 hours. No results for input(s): AMMONIA in the last 168 hours.  CBC: Recent Labs  Lab 01/10/18 1247 01/11/18 0500  WBC 5.8 6.1  HGB 10.7* 10.5*  HCT 31.4* 31.0*  MCV 89.1 89.5  PLT 141* 146*    Cardiac Enzymes: No results for input(s): CKTOTAL, CKMB, CKMBINDEX, TROPONINI  in the last 168 hours.  BNP: Invalid input(s): POCBNP  CBG: Recent Labs  Lab 01/12/18 2130 01/12/18 2325 01/13/18 0803 01/13/18 1155 01/13/18 1644  GLUCAP 140* 148* 79 114* 120*    Microbiology: No results found for this or any previous visit.  Coagulation Studies: No results for input(s): LABPROT, INR in the last 72 hours.  Urinalysis: No results for input(s): COLORURINE, LABSPEC, PHURINE, GLUCOSEU, HGBUR, BILIRUBINUR, KETONESUR, PROTEINUR, UROBILINOGEN, NITRITE, LEUKOCYTESUR in the last 72 hours.  Invalid input(s): APPERANCEUR    Imaging: No results found.   Medications:   . furosemide (LASIX) infusion 6 mg/hr (01/12/18 0916)   . amiodarone  200 mg Oral Daily  . aspirin EC  81 mg Oral QHS  . atorvastatin  40 mg Oral QHS  . budesonide (PULMICORT) nebulizer solution  0.5 mg Nebulization BID  . carvedilol  12.5 mg Oral BID WC  . cholecalciferol  2,000 Units Oral Q breakfast  . clopidogrel  75 mg Oral Q breakfast  . dextromethorphan  30 mg Oral BID   And  . guaiFENesin  600 mg Oral BID  . enoxaparin (LOVENOX) injection  30 mg Subcutaneous Q24H  . fluticasone  2 spray Each Nare QHS  . insulin aspart  0-5 Units Subcutaneous QHS  . ipratropium-albuterol  3 mL Nebulization BID  . losartan  100 mg Oral Daily  . multivitamin with minerals  1 tablet Oral Q breakfast  . omega-3 acid ethyl esters  4,000 mg Oral Q1200  . potassium chloride  40 mEq Oral Daily   acetaminophen **OR** acetaminophen, nitroGLYCERIN, ondansetron **OR** ondansetron (ZOFRAN) IV, temazepam  Assessment/ Plan:  84 y.o. male with PMHx of coronary artery disease status post 5 vessel CABG with PTCA x 2 11/2013, diabetes mellitus, hypertension, hyperlipidemia, cholecystectomy, tongue lesion resection, GERD   1.  Acute renal failure/chronic kidney disease stage IV.  Currently BUN is 86 with a creatinine of 3.48 and EGFR of 15.  No urgent indication for dialysis however this may be a possibility in the  future.  Continue to monitor renal function closely while patient remains on Lasix drip.  2.  Acute on chronic systolic heart failure exacerbation.  atient does appear to be slowly improving.  Maintain the patient on Lasix drip as above.  3.  Anemia of chronic kidney disease.  ontinue to monitor hemoglobin as an outpatient.  No indication for Procrit at the moment.     LOS: 3 Hend Mccarrell 8/23/20195:17 PM

## 2018-01-14 LAB — BASIC METABOLIC PANEL
ANION GAP: 12 (ref 5–15)
BUN: 84 mg/dL — ABNORMAL HIGH (ref 8–23)
CALCIUM: 9 mg/dL (ref 8.9–10.3)
CHLORIDE: 98 mmol/L (ref 98–111)
CO2: 31 mmol/L (ref 22–32)
Creatinine, Ser: 3.43 mg/dL — ABNORMAL HIGH (ref 0.61–1.24)
GFR calc non Af Amer: 15 mL/min — ABNORMAL LOW (ref >60)
GFR, EST AFRICAN AMERICAN: 17 mL/min — AB (ref >60)
Glucose, Bld: 103 mg/dL — ABNORMAL HIGH (ref 70–99)
POTASSIUM: 3.8 mmol/L (ref 3.5–5.1)
Sodium: 141 mmol/L (ref 135–145)

## 2018-01-14 LAB — GLUCOSE, CAPILLARY
GLUCOSE-CAPILLARY: 83 mg/dL (ref 70–99)
Glucose-Capillary: 114 mg/dL — ABNORMAL HIGH (ref 70–99)
Glucose-Capillary: 143 mg/dL — ABNORMAL HIGH (ref 70–99)
Glucose-Capillary: 191 mg/dL — ABNORMAL HIGH (ref 70–99)

## 2018-01-14 NOTE — Progress Notes (Signed)
PT Cancellation Note  Patient Details Name: Matthew [name] MRN: [id number] DOB: [birth year]   Cancelled Treatment:    Reason Eval/Treat Not Completed: Other (comment). Treatment attempted, however pt just finished ambulating in hallway with his family and is fatigued. Reports he was steady during ambulation and politely refuses therapy this date. Plans to dc next date.    Hindy Perrault 01/14/2018, 1:43 PM  Greggory Stallion, PT, DPT [phone]

## 2018-01-14 NOTE — Progress Notes (Signed)
[facility]  ROUNDING NOTE   Subjective:  Patient seen at bedside. Edema continues to improve. Weight loss noted this a.m. We agreed to continue Lasix drip for 1 additional day.  Objective:  Vital signs in last 24 hours:  Temp:  [97.7 F (36.5 C)-98.7 F (37.1 C)] 97.8 F (36.6 C) (08/24 0739) Pulse Rate:  [65-90] 81 (08/24 0739) Resp:  [18] 18 (08/23 1634) BP: (104-108)/(58-77) 107/68 (08/24 0739) SpO2:  [91 %-98 %] 91 % (08/24 0739) Weight:  [100.1 kg] 100.1 kg (08/24 0400)  Weight change: -2.858 kg Filed Weights   01/12/18 0442 01/13/18 0500 01/14/18 0400  Weight: 103.7 kg 103 kg 100.1 kg    Intake/Output: I/O last 3 completed shifts: In: 543.8 [P.O.:440; I.V.:103.8] Out: 3875 [Urine:3875]   Intake/Output this shift:  No intake/output data recorded.  Physical Exam: General: No acute distress  Head: Normocephalic, atraumatic. Moist oral mucosal membranes  Eyes: Anicteric  Neck: Supple, trachea midline  Lungs:  CTAB, normal effort  Heart: S1S2 no rubs  Abdomen:  Soft, nontender, bowel sounds present  Extremities: 2+ peripheral edema.  Neurologic: Awake, alert, following commands  Skin: No lesions       Basic Metabolic Panel: Recent Labs  Lab 01/10/18 1247 01/11/18 0500 01/13/18 0418 01/14/18 0353  NA 142 142 141 141  K 3.6 3.7 3.8 3.8  CL 103 104 100 98  CO2 '28 28 30 31  ' GLUCOSE 102* 112* 106* 103*  BUN 72* 78* 86* 84*  CREATININE 3.57* 3.61* 3.48* 3.43*  CALCIUM 8.9 9.0 9.1 9.0    Liver Function Tests: No results for input(s): AST, ALT, ALKPHOS, BILITOT, PROT, ALBUMIN in the last 168 hours. No results for input(s): LIPASE, AMYLASE in the last 168 hours. No results for input(s): AMMONIA in the last 168 hours.  CBC: Recent Labs  Lab 01/10/18 1247 01/11/18 0500  WBC 5.8 6.1  HGB 10.7* 10.5*  HCT 31.4* 31.0*  MCV 89.1 89.5  PLT 141* 146*    Cardiac Enzymes: No results for input(s): CKTOTAL, CKMB, CKMBINDEX, TROPONINI in  the last 168 hours.  BNP: Invalid input(s): POCBNP  CBG: Recent Labs  Lab 01/13/18 0803 01/13/18 1155 01/13/18 1644 01/13/18 2043 01/14/18 0742  GLUCAP 79 114* 120* 124* 83    Microbiology: No results found for this or any previous visit.  Coagulation Studies: No results for input(s): LABPROT, INR in the last 72 hours.  Urinalysis: No results for input(s): COLORURINE, LABSPEC, PHURINE, GLUCOSEU, HGBUR, BILIRUBINUR, KETONESUR, PROTEINUR, UROBILINOGEN, NITRITE, LEUKOCYTESUR in the last 72 hours.  Invalid input(s): APPERANCEUR    Imaging: No results found.   Medications:   . furosemide (LASIX) infusion 6 mg/hr (01/14/18 0805)   . amiodarone  200 mg Oral Daily  . aspirin EC  81 mg Oral QHS  . atorvastatin  40 mg Oral QHS  . budesonide (PULMICORT) nebulizer solution  0.5 mg Nebulization BID  . carvedilol  12.5 mg Oral BID WC  . cholecalciferol  2,000 Units Oral Q breakfast  . clopidogrel  75 mg Oral Q breakfast  . dextromethorphan  30 mg Oral BID   And  . guaiFENesin  600 mg Oral BID  . enoxaparin (LOVENOX) injection  30 mg Subcutaneous Q24H  . fluticasone  2 spray Each Nare QHS  . insulin aspart  0-5 Units Subcutaneous QHS  . ipratropium-albuterol  3 mL Nebulization BID  . losartan  100 mg Oral Daily  . multivitamin with minerals  1 tablet Oral Q breakfast  .  omega-3 acid ethyl esters  4,000 mg Oral Q1200  . potassium chloride  40 mEq Oral Daily   acetaminophen **OR** acetaminophen, nitroGLYCERIN, ondansetron **OR** ondansetron (ZOFRAN) IV, temazepam  Assessment/ Plan:  84 y.o. male with PMHx of coronary artery disease status post 5 vessel CABG with PTCA x 2 11/2013, diabetes mellitus, hypertension, hyperlipidemia, cholecystectomy, tongue lesion resection, GERD   1.  Acute renal failure/chronic kidney disease stage IV.  Current BUN is 84 with a creatinine of 3.4 with a EGFR of 15.  He has been in need of Lasix drip given his volume overload.  We will continue  Lasix drip for 1 additional day.  2.  Acute on chronic systolic heart failure exacerbation.  Patient continues to have slow but steady improvement.  Urine output was 2.5 L over the preceding 24 hours.  Weight loss also noted.  Continue Lasix drip for 1 additional day and transition the patient to Lasix 80 mg p.o. twice daily tomorrow.  3.  Anemia of chronic kidney disease.  We plan to monitor CBC as an outpatient.     LOS: 4 Matthew [name] 8/24/20198:21 AM

## 2018-01-14 NOTE — Progress Notes (Addendum)
[facility] [place] NAME: Matthew [name]    MR#:  [id number]  DATE OF BIRTH:  [birth year]  SUBJECTIVE: Still on Lasix drip.  Denies shortness of breath.  Urine output about 1.8 L yesterday, weight is down 3 kg..  Patient seen and evaluated by me today Swelling in the lower extremities still present Decreased shortness of breath No chest pain  REVIEW OF SYSTEMS:   ROS CONSTITUTIONAL: No fever, fatigue or weakness.  EYES: No blurred or double vision.  EARS, NOSE, AND THROAT: No tinnitus or ear pain.  RESPIRATORY: Some cough but no shortness of breath.  CARDIOVASCULAR: No chest pain, orthopnea, edema.  GASTROINTESTINAL: No nausea, vomiting, diarrhea or abdominal pain.  GENITOURINARY: No dysuria, hematuria.  ENDOCRINE: No polyuria, nocturia,  HEMATOLOGY: No anemia, easy bruising or bleeding SKIN: No rash or lesion. MUSCULOSKELETAL: No joint pain or arthritis.   Has lower extremity edema NEUROLOGIC: No tingling, numbness, weakness.  PSYCHIATRY: No anxiety or depression.   DRUG ALLERGIES:   Allergies  Allergen Reactions  . Amoxicillin Diarrhea    VITALS:  Blood pressure 107/68, pulse 81, temperature 97.8 F (36.6 C), temperature source Oral, resp. rate 18, height 6' (1.829 m), weight 100.1 kg, SpO2 91 %.  PHYSICAL EXAMINATION:  GENERAL:  84 y.o.-year-old patient lying in the bed with no acute distress.  Patient has anasarca. EYES: Pupils equal, round, reactive to light and accommodation. No scleral icterus. Extraocular muscles intact.  HEENT: Head atraumatic, normocephalic. Oropharynx and nasopharynx clear.  NECK:  Supple, no jugular venous distention. No thyroid enlargement, no tenderness.  LUNGS: Normal breath sounds bilaterally, no wheezing, rales,rhonchi or crepitation. No use of accessory muscles of respiration.  CARDIOVASCULAR: S1, S2 normal. No murmurs, rubs, or gallops.  ABDOMEN: Soft, nontender, nondistended. Bowel  sounds present. No organomegaly or mass.  EXTREMITIES: 2+ pitting edema present NEUROLOGIC: Cranial nerves II through XII are intact. Muscle strength 5/5 in all extremities. Sensation intact. Gait not checked.  PSYCHIATRIC: The patient is alert and oriented x 3.  SKIN: No obvious rash, lesion, or ulcer.    LABORATORY PANEL:   CBC Recent Labs  Lab 01/11/18 0500  WBC 6.1  HGB 10.5*  HCT 31.0*  PLT 146*   ------------------------------------------------------------------------------------------------------------------  Chemistries  Recent Labs  Lab 01/14/18 0353  NA 141  K 3.8  CL 98  CO2 31  GLUCOSE 103*  BUN 84*  CREATININE 3.43*  CALCIUM 9.0   ------------------------------------------------------------------------------------------------------------------  Cardiac Enzymes No results for input(s): TROPONINI in the last 168 hours. ------------------------------------------------------------------------------------------------------------------  RADIOLOGY:  No results found.  EKG:   Orders placed or performed during the hospital encounter of 01/10/18  . EKG 12-Lead  . EKG 12-Lead    ASSESSMENT AND PLAN:  84 year old male patient with history of CAD status post bypass surgery, Hypertension,Hyperlipidemia, diabetes mellitus type 2 comes in because of worsening leg edema along with worsening renal failure center from Dr. Elwyn Lade office directly.  1 .Acute renal failure on chronic kidney disease stage IV: Patient creatinine is up around 3.6, normal creatinine is around 2.4.  Continue Lasix drip for one more day Appreciate Nephrology f/u  ,    3..Acute on chronic systolic heart failure exacerbation improving slowly.  18 pound weight gain over the past several months.  Significant lower extremity edema as well as pulmonary edema.  Echo showed EF 45%;continue   Beta blocker,ARB,  Echocardiogram results : Left ventricle: The cavity size was moderately dilated. The  estimated ejection fraction was 45%. Diffuse hypokinesis. - Aortic valve: There was moderate regurgitation. - Right ventricle: The cavity size was moderately dilated. - Atrial septum: No defect or patent foramen ovale was identified. - Tricuspid valve: There was moderate regurgitation.  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   4.H/o CAD s/p CABG : continue ASA,plavix,Beta blocker,statins  5.History of arrhythmia on amiodarone and Coreg.   Aspirin and Plavix on board  All the records are reviewed and case discussed with Care Management/Social Workerr. Management plans discussed with the patient, family and they are in agreement.  CODE STATUS: DNR  TOTAL TIME TAKING CARE OF THIS PATIENT:35 min More than 50% time spent in counseling and coordination of care.Reatha Harps Pyreddy M.D on 01/14/2018 at 12:15 PM  Between 7am to 6pm - Pager - [phone]  After 6pm go to [email or web address] - password [email or web address] [facility]  Office  [phone]  CC: Primary care physician; Jodi Marble, MD   Note: This dictation was prepared with Dragon dictation along with smaller phrase technology. Any transcriptional errors that result from this process are unintentional.

## 2018-01-15 LAB — GLUCOSE, CAPILLARY: GLUCOSE-CAPILLARY: 98 mg/dL (ref 70–99)

## 2018-01-15 LAB — BASIC METABOLIC PANEL
ANION GAP: 10 (ref 5–15)
BUN: 87 mg/dL — ABNORMAL HIGH (ref 8–23)
CALCIUM: 9.3 mg/dL (ref 8.9–10.3)
CHLORIDE: 97 mmol/L — AB (ref 98–111)
CO2: 32 mmol/L (ref 22–32)
Creatinine, Ser: 3.43 mg/dL — ABNORMAL HIGH (ref 0.61–1.24)
GFR calc non Af Amer: 15 mL/min — ABNORMAL LOW (ref >60)
GFR, EST AFRICAN AMERICAN: 17 mL/min — AB (ref >60)
Glucose, Bld: 110 mg/dL — ABNORMAL HIGH (ref 70–99)
Potassium: 3.7 mmol/L (ref 3.5–5.1)
SODIUM: 139 mmol/L (ref 135–145)

## 2018-01-15 MED ORDER — FUROSEMIDE 40 MG PO TABS: 80.0000 mg | ORAL_TABLET | Freq: Two times a day (BID) | ORAL | 0 refills | Status: DC

## 2018-01-15 MED ORDER — POTASSIUM CHLORIDE CRYS ER 20 MEQ PO TBCR: 40.0000 meq | EXTENDED_RELEASE_TABLET | Freq: Every day | ORAL | 0 refills | Status: DC

## 2018-01-15 NOTE — Progress Notes (Signed)
Pt to be discharged to home with home health. Iv and tele removed. disch instructions given to pt's daughter. disch via w.c. Accompanied by family.

## 2018-01-15 NOTE — Care Management Note (Signed)
Case Management Note  Patient Details  Name: Matthew [name] MRN: [id number] Date of Birth: 1931/03/09  Subjective/Objective:  Patient to be discharged per MD order. Orders in place for home health services. Patient is actually currently open with [facility] and has orders in place to resume services. Patient agreeable to resume. Referral placed with [name] from [facility] who confirms patient for Rn, PT and aide services. No DME needs. Patient to be transported home by family. No further needs. Ines Bloomer RN BSN RNCM [phone]                    Action/Plan:   Expected Discharge Date:  01/15/18               Expected Discharge Plan:  [place]  In-House Referral:     Discharge planning Services  CM Consult  Post Acute Care Choice:  Home Health Choice offered to:  Patient, Adult Children  DME Arranged:    DME Agency:     HH Arranged:  RN, PT, Nurse's Aide HH Agency:  [place]  Status of Service:  Completed, signed off  If discussed at [place] of Stay Meetings, dates discussed:    Additional Comments:  Kathyrn Drown Velna Hedgecock, RN 01/15/2018, 10:45 AM

## 2018-01-15 NOTE — Progress Notes (Signed)
SUBJECTIVE: patient is feeling much better now   Vitals:   01/14/18 1940 01/14/18 2017 01/15/18 0326 01/15/18 0330  BP: 99/67  98/66   Pulse: (!) 59  77   Resp: 18  18   Temp: 98.2 F (36.8 C)  97.7 F (36.5 C)   TempSrc: Oral  Oral   SpO2: 99% 99% 96%   Weight:    98.6 kg  Height:        Intake/Output Summary (Last 24 hours) at 01/15/2018 0923 Last data filed at 01/15/2018 0900 Gross per 24 hour  Intake -  Output 2600 ml  Net -2600 ml    LABS: Basic Metabolic Panel: Recent Labs    01/14/18 0353 01/15/18 0548  NA 141 139  K 3.8 3.7  CL 98 97*  CO2 31 32  GLUCOSE 103* 110*  BUN 84* 87*  CREATININE 3.43* 3.43*  CALCIUM 9.0 9.3   Liver Function Tests: No results for input(s): AST, ALT, ALKPHOS, BILITOT, PROT, ALBUMIN in the last 72 hours. No results for input(s): LIPASE, AMYLASE in the last 72 hours. CBC: No results for input(s): WBC, NEUTROABS, HGB, HCT, MCV, PLT in the last 72 hours. Cardiac Enzymes: No results for input(s): CKTOTAL, CKMB, CKMBINDEX, TROPONINI in the last 72 hours. BNP: Invalid input(s): POCBNP D-Dimer: No results for input(s): DDIMER in the last 72 hours. Hemoglobin A1C: No results for input(s): HGBA1C in the last 72 hours. Fasting Lipid Panel: No results for input(s): CHOL, HDL, LDLCALC, TRIG, CHOLHDL, LDLDIRECT in the last 72 hours. Thyroid Function Tests: No results for input(s): TSH, T4TOTAL, T3FREE, THYROIDAB in the last 72 hours.  Invalid input(s): FREET3 Anemia Panel: No results for input(s): VITAMINB12, FOLATE, FERRITIN, TIBC, IRON, RETICCTPCT in the last 72 hours.   PHYSICAL EXAM General: Well developed, well nourished, in no acute distress HEENT:  Normocephalic and atramatic Neck:  No JVD.  Lungs: Clear bilaterally to auscultation and percussion. Heart: HRRR . Normal S1 and S2 without gallops or murmurs.  Abdomen: Bowel sounds are positive, abdomen soft and non-tender  Msk:  Back normal, normal gait. Normal strength and  tone for age. Extremities: No clubbing, cyanosis or edema.   Neuro: Alert and oriented X 3. Psych:  Good affect, responds appropriately  TELEMETRY: sinus rhythm  ASSESSMENT AND PLAN: congestive heart failure due to systolic dysfunction as well as diastolic dysfunction and dilated aortic root with aortic dissection which was evaluated by [facility] CT surgery and they felt that patient should be treated medically.  Active Problems:   Fluid overload    Matthew David, MD, [facility] 01/15/2018 9:23 AM

## 2018-01-15 NOTE — Progress Notes (Signed)
[facility]  ROUNDING NOTE   Subjective:  Patient seen at bedside. Good urine output of 3.3 L over the preceding 24 hours. Lasix drip to be stopped today. Patient to be transitioned to Lasix 80 mg twice daily.  Objective:  Vital signs in last 24 hours:  Temp:  [97.7 F (36.5 C)-98.2 F (36.8 C)] 97.7 F (36.5 C) (08/25 0326) Pulse Rate:  [59-77] 77 (08/25 0326) Resp:  [18] 18 (08/25 0326) BP: (98-104)/(59-67) 98/66 (08/25 0326) SpO2:  [96 %-99 %] 96 % (08/25 0326) Weight:  [98.6 kg] 98.6 kg (08/25 0330)  Weight change: -1.497 kg Filed Weights   01/13/18 0500 01/14/18 0400 01/15/18 0330  Weight: 103 kg 100.1 kg 98.6 kg    Intake/Output: I/O last 3 completed shifts: In: 240 [P.O.:240] Out: 4850 [Urine:4850]   Intake/Output this shift:  Total I/O In: -  Out: 300 [Urine:300]  Physical Exam: General: No acute distress  Head: Normocephalic, atraumatic. Moist oral mucosal membranes  Eyes: Anicteric  Neck: Supple, trachea midline  Lungs:  CTAB, normal effort  Heart: S1S2 no rubs  Abdomen:  Soft, nontender, bowel sounds present  Extremities: 1+ peripheral edema.  Neurologic: Awake, alert, following commands  Skin: No lesions       Basic Metabolic Panel: Recent Labs  Lab 01/10/18 1247 01/11/18 0500 01/13/18 0418 01/14/18 0353 01/15/18 0548  NA 142 142 141 141 139  K 3.6 3.7 3.8 3.8 3.7  CL 103 104 100 98 97*  CO2 '28 28 30 31 ' 32  GLUCOSE 102* 112* 106* 103* 110*  BUN 72* 78* 86* 84* 87*  CREATININE 3.57* 3.61* 3.48* 3.43* 3.43*  CALCIUM 8.9 9.0 9.1 9.0 9.3    Liver Function Tests: No results for input(s): AST, ALT, ALKPHOS, BILITOT, PROT, ALBUMIN in the last 168 hours. No results for input(s): LIPASE, AMYLASE in the last 168 hours. No results for input(s): AMMONIA in the last 168 hours.  CBC: Recent Labs  Lab 01/10/18 1247 01/11/18 0500  WBC 5.8 6.1  HGB 10.7* 10.5*  HCT 31.4* 31.0*  MCV 89.1 89.5  PLT 141* 146*    Cardiac  Enzymes: No results for input(s): CKTOTAL, CKMB, CKMBINDEX, TROPONINI in the last 168 hours.  BNP: Invalid input(s): POCBNP  CBG: Recent Labs  Lab 01/14/18 0742 01/14/18 1138 01/14/18 1619 01/14/18 2059 01/15/18 0748  GLUCAP 83 114* 143* 191* 98    Microbiology: No results found for this or any previous visit.  Coagulation Studies: No results for input(s): LABPROT, INR in the last 72 hours.  Urinalysis: No results for input(s): COLORURINE, LABSPEC, PHURINE, GLUCOSEU, HGBUR, BILIRUBINUR, KETONESUR, PROTEINUR, UROBILINOGEN, NITRITE, LEUKOCYTESUR in the last 72 hours.  Invalid input(s): APPERANCEUR    Imaging: No results found.   Medications:   . furosemide (LASIX) infusion 6 mg/hr (01/14/18 0805)   . amiodarone  200 mg Oral Daily  . aspirin EC  81 mg Oral QHS  . atorvastatin  40 mg Oral QHS  . budesonide (PULMICORT) nebulizer solution  0.5 mg Nebulization BID  . carvedilol  12.5 mg Oral BID WC  . cholecalciferol  2,000 Units Oral Q breakfast  . clopidogrel  75 mg Oral Q breakfast  . dextromethorphan  30 mg Oral BID   And  . guaiFENesin  600 mg Oral BID  . enoxaparin (LOVENOX) injection  30 mg Subcutaneous Q24H  . fluticasone  2 spray Each Nare QHS  . insulin aspart  0-5 Units Subcutaneous QHS  . ipratropium-albuterol  3 mL Nebulization BID  .  losartan  100 mg Oral Daily  . multivitamin with minerals  1 tablet Oral Q breakfast  . omega-3 acid ethyl esters  4,000 mg Oral Q1200  . potassium chloride  40 mEq Oral Daily   acetaminophen **OR** acetaminophen, nitroGLYCERIN, ondansetron **OR** ondansetron (ZOFRAN) IV, temazepam  Assessment/ Plan:  84 y.o. male with PMHx of coronary artery disease status post 5 vessel CABG with PTCA x 2 11/2013, diabetes mellitus, hypertension, hyperlipidemia, cholecystectomy, tongue lesion resection, GERD   1.  Acute renal failure/chronic kidney disease stage IV.  Renal function has remained relatively stable while on Lasix drip.   BUN of 87 with a creatinine of 3.4 and EGFR 15.  We will need to monitor his renal function as an outpatient.  We have had preliminary discussions regarding renal placement therapy over this hospitalization.  They are willing to move forward with renal placement therapy should be deemed necessary.  2.  Acute on chronic systolic heart failure exacerbation.  Patient has had good weight reduction over the course of the hospitalization.  We will stop Lasix drip.  Recommend starting the patient on Lasix 80 mg p.o. twice daily over the course of the hospitalization.  3.  Anemia of chronic kidney disease.  Reassess hemoglobin as an outpatient.     LOS: 5 Matthew [name] 8/25/20199:11 AM

## 2018-01-15 NOTE — Discharge Summary (Addendum)
[facility] NAME: Matthew [name]    MR#:  [id number]  DATE OF BIRTH:  1930/07/07  DATE OF ADMISSION:  01/10/2018 ADMITTING PHYSICIAN: Hillary Bow, MD  DATE OF DISCHARGE: 01/15/2018  PRIMARY CARE PHYSICIAN: Jodi Marble, MD   ADMISSION DIAGNOSIS:  Acute on chronic cystolic heart failure exacerbation  Chronic kidney disease stage IV Type 2 diabetes mellitus History of Arrythrmia Coronary artery disease DISCHARGE DIAGNOSIS:  Active Problems:   Fluid overload Acute on chronic systolic heart failure exacerbation CKD stage IV Type 2 diabetes mellitus Coronary artery disease  SECONDARY DIAGNOSIS:   Past Medical History:  Diagnosis Date  . Anemia   . Arthritis   . Broken arm 05/2013   Left  . CHF (congestive heart failure) ([place])   . Chronic kidney disease   . Coronary artery disease   . Diabetes mellitus without complication ([place])   . Dyspnea    DOE  . Edema    FEET/LEGS OCCAS  . Hypertension   . Myocardial infarction [facility])      ADMITTING HISTORY Matthew [name]  is a 84 y.o. male with a known history of patient had a routine appointment with Dr. Holley Raring today and was sent in for further evaluation.  Patient has been gaining lots of fluid 15 to 20 pounds of the last couple weeks.  Fluid is all over.  Patient complains of some shortness of breath and a dry cough.  No chest pain.  Complains of weakness.  HOSPITAL COURSE:  Patient admitted to telemetry and started on IV Lasix drip for diuresis.  Nephrology consultation was done in the hospital along with cardiology consultation.  Patient was worked up with echocardiogram. Left ventricle: The cavity size was moderately dilated. The   estimated ejection fraction was 45%. Diffuse hypokinesis. - Aortic valve: There was moderate regurgitation. - Right ventricle: The cavity size was moderately dilated. - Atrial septum: No defect or patent foramen ovale was identified. -  Tricuspid valve: There was moderate regurgitation.  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 Patient was aggressively diuresed with IV Lasix drip.  His excess fluid was removed.  He was switched to oral Lasix 80 mg twice daily.  Patient tolerated IV Lasix drip well.  Swelling in the lower extremities, shortness of breath improved.  Patient will be discharged home. CONSULTS OBTAINED:  Treatment Team:  Anthonette Legato, MD Dionisio David, MD  DRUG ALLERGIES:   Allergies  Allergen Reactions  . Amoxicillin Diarrhea    DISCHARGE MEDICATIONS:   Allergies as of 01/15/2018      Reactions   Amoxicillin Diarrhea      Medication List    TAKE these medications   amiodarone 200 MG tablet Commonly known as:  PACERONE Take 200 mg by mouth daily.   aspirin EC 81 MG tablet Take 81 mg by mouth at bedtime.   atorvastatin 40 MG tablet Commonly known as:  LIPITOR Take 40 mg by mouth at bedtime.   carvedilol 25 MG tablet Commonly known as:  COREG Take 25 mg by mouth 2 (two) times daily with a meal.   CENTRUM SILVER ADULT 50+ Tabs Take 1 tablet by mouth daily with breakfast.   clopidogrel 75 MG tablet Commonly known as:  PLAVIX Take 1 tablet (75 mg total) by mouth daily  with breakfast.   fenofibrate 160 MG tablet Take 160 mg by mouth daily.   Fish Oil 1000 MG Caps Take 4,000 mg by mouth daily at 12 noon.   FLOVENT HFA 110 MCG/ACT inhaler Generic drug:  fluticasone Inhale 2 puffs into the lungs daily.   fluticasone 50 MCG/ACT nasal spray Commonly known as:  FLONASE Place 2 sprays into both nostrils at bedtime.   furosemide 40 MG tablet Commonly known as:  LASIX Take 2 tablets (80 mg total) by mouth 2 (two) times daily. What changed:  how much to take   glyBURIDE 2.5 MG tablet Commonly known as:  DIABETA Take 2.5 mg  by mouth daily with breakfast.   losartan 100 MG tablet Commonly known as:  COZAAR Take 1 tablet by mouth daily.   nitroGLYCERIN 0.4 MG SL tablet Commonly known as:  NITROSTAT Place 0.4 mg under the tongue every 5 (five) minutes as needed for chest pain.   potassium chloride SA 20 MEQ tablet Commonly known as:  K-DUR,KLOR-CON Take 2 tablets (40 mEq total) by mouth daily. Start taking on:  01/16/2018   PROAIR HFA 108 (90 Base) MCG/ACT inhaler Generic drug:  albuterol Inhale 2 puffs into the lungs every 6 (six) hours as needed.   Vitamin D3 2000 units Tabs Take 2,000 Units by mouth daily with breakfast.   zolpidem 5 MG tablet Commonly known as:  AMBIEN Take 2.5 mg by mouth at bedtime as needed for sleep.       Today  Patient seen and evaluated today Decreased swelling in the lower extremities No shortness of breath Tolerating diet well  VITAL SIGNS:  Blood pressure 98/66, pulse 77, temperature 97.7 F (36.5 C), temperature source Oral, resp. rate 18, height 6' (1.829 m), weight 98.6 kg, SpO2 96 %.  I/O:    Intake/Output Summary (Last 24 hours) at 01/15/2018 1112 Last data filed at 01/15/2018 0900 Gross per 24 hour  Intake -  Output 2600 ml  Net -2600 ml    PHYSICAL EXAMINATION:  Physical Exam  GENERAL:  84 y.o.-year-old patient lying in the bed with no acute distress.  LUNGS: Normal breath sounds bilaterally, no wheezing, rales,rhonchi or crepitation. No use of accessory muscles of respiration.  CARDIOVASCULAR: S1, S2 normal. No murmurs, rubs, or gallops.  ABDOMEN: Soft, non-tender, non-distended. Bowel sounds present. No organomegaly or mass.  NEUROLOGIC: Moves all 4 extremities. PSYCHIATRIC: The patient is alert and oriented x 3.  SKIN: No obvious rash, lesion, or ulcer.   DATA REVIEW:   CBC Recent Labs  Lab 01/11/18 0500  WBC 6.1  HGB 10.5*  HCT 31.0*  PLT 146*    Chemistries  Recent Labs  Lab 01/15/18 0548  NA 139  K 3.7  CL 97*  CO2 32   GLUCOSE 110*  BUN 87*  CREATININE 3.43*  CALCIUM 9.3    Cardiac Enzymes No results for input(s): TROPONINI in the last 168 hours.  Microbiology Results  No results found for this or any previous visit.  RADIOLOGY:  No results found.  Follow up with PCP in 1 week.  Management plans discussed with the patient, family and they are in agreement.  CODE STATUS: DNR    Code Status Orders  (From admission, onward)         Start     Ordered   01/10/18 1248  Do not attempt resuscitation (DNR)  Continuous    Question Answer Comment  In the event of cardiac or respiratory ARREST Do not call  a "code blue"   In the event of cardiac or respiratory ARREST Do not perform Intubation, CPR, defibrillation or ACLS   In the event of cardiac or respiratory ARREST Use medication by any route, position, wound care, and other measures to relive pain and suffering. May use oxygen, suction and manual treatment of airway obstruction as needed for comfort.   Comments nurse may pronounce      01/10/18 1248        Code Status History    Date Active Date Inactive Code Status Order ID Comments User Context   01/10/2018 1224 01/10/2018 1248 Full Code [id number]  Loletha Grayer, MD Inpatient   12/09/2017 1835 12/11/2017 1508 Full Code [id number]  Loletha Grayer, MD Inpatient   12/06/2013 1125 12/07/2013 1536 Full Code [id number]  Belva Crome, MD Inpatient   12/04/2013 0955 12/06/2013 1125 Full Code [id number]  Leonie Man, MD Inpatient   12/03/2013 0407 12/04/2013 0955 Full Code [id number]  Rise Patience, MD Inpatient    Advance Directive Documentation     Most Recent Value  Type of Advance Directive  Healthcare Power of Attorney, Living will  Pre-existing out of facility DNR order (yellow form or pink MOST form)  -  "MOST" Form in Place?  -      TOTAL TIME TAKING CARE OF THIS PATIENT ON DAY OF DISCHARGE: more than 34 minutes.   Saundra Shelling M.D on 01/15/2018 at 11:12 AM  Between 7am to  6pm - Pager - [phone]  After 6pm go to [email or web address] - password [email or web address] [facility]  Office  [phone]  CC: Primary care physician; Jodi Marble, MD  Note: This dictation was prepared with Dragon dictation along with smaller phrase technology. Any transcriptional errors that result from this process are unintentional.

## 2018-01-15 NOTE — Progress Notes (Signed)
Advanced care plan. Purpose of the Encounter: CODE STATUS Parties in Attendance: Patient Patient's Decision Capacity: Good Subjective/Patient's story: Presented to emergency room for shortness of breath and fluid overload Has anasarca Objective/Medical story Needs IV Lasix drip for diuresis and removal of excess fluid Needs renal function monitoring Goals of care determination:  Advance care directives goals of care discussed with the patient in detail Patient does not want any resuscitation, intubation if need arises CODE STATUS: DNR Time spent discussing advanced care planning: 16 minutes

## 2018-01-16 DIAGNOSIS — M170 Bilateral primary osteoarthritis of knee: Secondary | ICD-10-CM | POA: Diagnosis not present

## 2018-01-16 DIAGNOSIS — E119 Type 2 diabetes mellitus without complications: Secondary | ICD-10-CM | POA: Diagnosis not present

## 2018-01-16 DIAGNOSIS — I2510 Atherosclerotic heart disease of native coronary artery without angina pectoris: Secondary | ICD-10-CM | POA: Diagnosis not present

## 2018-01-16 DIAGNOSIS — I110 Hypertensive heart disease with heart failure: Secondary | ICD-10-CM | POA: Diagnosis not present

## 2018-01-16 DIAGNOSIS — S41112D Laceration without foreign body of left upper arm, subsequent encounter: Secondary | ICD-10-CM | POA: Diagnosis not present

## 2018-01-16 DIAGNOSIS — I5030 Unspecified diastolic (congestive) heart failure: Secondary | ICD-10-CM | POA: Diagnosis not present

## 2018-01-18 ENCOUNTER — Other Ambulatory Visit: Payer: Self-pay | Admitting: *Deleted

## 2018-01-18 ENCOUNTER — Emergency Department: Payer: Medicare Other

## 2018-01-18 ENCOUNTER — Inpatient Hospital Stay
Admission: EM | Admit: 2018-01-18 | Discharge: 2018-01-20 | DRG: 637 | Disposition: A | Discharge status: Home / Self Care | Payer: Medicare Other | Attending: Internal Medicine | Admitting: Internal Medicine

## 2018-01-18 ENCOUNTER — Other Ambulatory Visit: Payer: Self-pay

## 2018-01-18 DIAGNOSIS — G9341 Metabolic encephalopathy: Secondary | ICD-10-CM | POA: Diagnosis present

## 2018-01-18 DIAGNOSIS — Z951 Presence of aortocoronary bypass graft: Secondary | ICD-10-CM

## 2018-01-18 DIAGNOSIS — E162 Hypoglycemia, unspecified: Secondary | ICD-10-CM

## 2018-01-18 DIAGNOSIS — N184 Chronic kidney disease, stage 4 (severe): Secondary | ICD-10-CM | POA: Diagnosis present

## 2018-01-18 DIAGNOSIS — Z883 Allergy status to other anti-infective agents status: Secondary | ICD-10-CM

## 2018-01-18 DIAGNOSIS — Z79899 Other long term (current) drug therapy: Secondary | ICD-10-CM | POA: Diagnosis not present

## 2018-01-18 DIAGNOSIS — Z66 Do not resuscitate: Secondary | ICD-10-CM | POA: Diagnosis present

## 2018-01-18 DIAGNOSIS — I5022 Chronic systolic (congestive) heart failure: Secondary | ICD-10-CM | POA: Diagnosis present

## 2018-01-18 DIAGNOSIS — S199XXA Unspecified injury of neck, initial encounter: Secondary | ICD-10-CM | POA: Diagnosis not present

## 2018-01-18 DIAGNOSIS — Z9049 Acquired absence of other specified parts of digestive tract: Secondary | ICD-10-CM | POA: Diagnosis not present

## 2018-01-18 DIAGNOSIS — Z7951 Long term (current) use of inhaled steroids: Secondary | ICD-10-CM | POA: Diagnosis not present

## 2018-01-18 DIAGNOSIS — I252 Old myocardial infarction: Secondary | ICD-10-CM | POA: Diagnosis not present

## 2018-01-18 DIAGNOSIS — I130 Hypertensive heart and chronic kidney disease with heart failure and stage 1 through stage 4 chronic kidney disease, or unspecified chronic kidney disease: Secondary | ICD-10-CM | POA: Diagnosis present

## 2018-01-18 DIAGNOSIS — Z8249 Family history of ischemic heart disease and other diseases of the circulatory system: Secondary | ICD-10-CM | POA: Diagnosis not present

## 2018-01-18 DIAGNOSIS — G4700 Insomnia, unspecified: Secondary | ICD-10-CM | POA: Diagnosis not present

## 2018-01-18 DIAGNOSIS — Z955 Presence of coronary angioplasty implant and graft: Secondary | ICD-10-CM | POA: Diagnosis not present

## 2018-01-18 DIAGNOSIS — Z9841 Cataract extraction status, right eye: Secondary | ICD-10-CM

## 2018-01-18 DIAGNOSIS — I10 Essential (primary) hypertension: Secondary | ICD-10-CM | POA: Diagnosis not present

## 2018-01-18 DIAGNOSIS — E785 Hyperlipidemia, unspecified: Secondary | ICD-10-CM | POA: Diagnosis present

## 2018-01-18 DIAGNOSIS — E1122 Type 2 diabetes mellitus with diabetic chronic kidney disease: Secondary | ICD-10-CM | POA: Diagnosis present

## 2018-01-18 DIAGNOSIS — W19XXXA Unspecified fall, initial encounter: Secondary | ICD-10-CM | POA: Diagnosis present

## 2018-01-18 DIAGNOSIS — S0990XA Unspecified injury of head, initial encounter: Secondary | ICD-10-CM | POA: Diagnosis not present

## 2018-01-18 DIAGNOSIS — I351 Nonrheumatic aortic (valve) insufficiency: Secondary | ICD-10-CM | POA: Diagnosis not present

## 2018-01-18 DIAGNOSIS — Z7982 Long term (current) use of aspirin: Secondary | ICD-10-CM

## 2018-01-18 DIAGNOSIS — Z9842 Cataract extraction status, left eye: Secondary | ICD-10-CM | POA: Diagnosis not present

## 2018-01-18 DIAGNOSIS — T383X5A Adverse effect of insulin and oral hypoglycemic [antidiabetic] drugs, initial encounter: Secondary | ICD-10-CM | POA: Diagnosis present

## 2018-01-18 DIAGNOSIS — E11649 Type 2 diabetes mellitus with hypoglycemia without coma: Secondary | ICD-10-CM | POA: Diagnosis not present

## 2018-01-18 DIAGNOSIS — I5043 Acute on chronic combined systolic (congestive) and diastolic (congestive) heart failure: Secondary | ICD-10-CM | POA: Diagnosis not present

## 2018-01-18 DIAGNOSIS — I2510 Atherosclerotic heart disease of native coronary artery without angina pectoris: Secondary | ICD-10-CM | POA: Diagnosis present

## 2018-01-18 DIAGNOSIS — Z7902 Long term (current) use of antithrombotics/antiplatelets: Secondary | ICD-10-CM

## 2018-01-18 DIAGNOSIS — Z961 Presence of intraocular lens: Secondary | ICD-10-CM | POA: Diagnosis present

## 2018-01-18 DIAGNOSIS — I447 Left bundle-branch block, unspecified: Secondary | ICD-10-CM | POA: Diagnosis not present

## 2018-01-18 DIAGNOSIS — Z87891 Personal history of nicotine dependence: Secondary | ICD-10-CM | POA: Diagnosis not present

## 2018-01-18 DIAGNOSIS — I4891 Unspecified atrial fibrillation: Secondary | ICD-10-CM | POA: Diagnosis not present

## 2018-01-18 DIAGNOSIS — D649 Anemia, unspecified: Secondary | ICD-10-CM | POA: Diagnosis not present

## 2018-01-18 LAB — COMPREHENSIVE METABOLIC PANEL
ALBUMIN: 3.5 g/dL (ref 3.5–5.0)
ALK PHOS: 74 U/L (ref 38–126)
ALT: 53 U/L — AB (ref 0–44)
ANION GAP: 10 (ref 5–15)
AST: 109 U/L — ABNORMAL HIGH (ref 15–41)
BILIRUBIN TOTAL: 1.8 mg/dL — AB (ref 0.3–1.2)
BUN: 89 mg/dL — ABNORMAL HIGH (ref 8–23)
CALCIUM: 9 mg/dL (ref 8.9–10.3)
CO2: 26 mmol/L (ref 22–32)
CREATININE: 3.91 mg/dL — AB (ref 0.61–1.24)
Chloride: 99 mmol/L (ref 98–111)
GFR calc non Af Amer: 13 mL/min — ABNORMAL LOW (ref >60)
GFR, EST AFRICAN AMERICAN: 15 mL/min — AB (ref >60)
GLUCOSE: 63 mg/dL — AB (ref 70–99)
Potassium: 4.5 mmol/L (ref 3.5–5.1)
Sodium: 135 mmol/L (ref 135–145)
TOTAL PROTEIN: 6.6 g/dL (ref 6.5–8.1)

## 2018-01-18 LAB — CBC WITH DIFFERENTIAL/PLATELET
Basophils Absolute: 0 10*3/uL (ref 0–0.1)
Basophils Relative: 1 %
Eosinophils Absolute: 0.2 10*3/uL (ref 0–0.7)
Eosinophils Relative: 4 %
HEMATOCRIT: 34.5 % — AB (ref 40.0–52.0)
Hemoglobin: 11.9 g/dL — ABNORMAL LOW (ref 13.0–18.0)
LYMPHS PCT: 7 %
Lymphs Abs: 0.4 10*3/uL — ABNORMAL LOW (ref 1.0–3.6)
MCH: 30.4 pg (ref 26.0–34.0)
MCHC: 34.4 g/dL (ref 32.0–36.0)
MCV: 88.4 fL (ref 80.0–100.0)
MONO ABS: 0.7 10*3/uL (ref 0.2–1.0)
MONOS PCT: 12 %
NEUTROS ABS: 4.3 10*3/uL (ref 1.4–6.5)
Neutrophils Relative %: 76 %
Platelets: 213 10*3/uL (ref 150–440)
RBC: 3.9 MIL/uL — ABNORMAL LOW (ref 4.40–5.90)
RDW: 18.6 % — AB (ref 11.5–14.5)
WBC: 5.7 10*3/uL (ref 3.8–10.6)

## 2018-01-18 LAB — TROPONIN I: Troponin I: <0.03 ng/mL (ref <0.03)

## 2018-01-18 LAB — GLUCOSE, CAPILLARY
GLUCOSE-CAPILLARY: 126 mg/dL — AB (ref 70–99)
Glucose-Capillary: 104 mg/dL — ABNORMAL HIGH (ref 70–99)
Glucose-Capillary: 128 mg/dL — ABNORMAL HIGH (ref 70–99)
Glucose-Capillary: 37 mg/dL — CL (ref 70–99)
Glucose-Capillary: 50 mg/dL — ABNORMAL LOW (ref 70–99)

## 2018-01-18 MED ORDER — DEXTROSE 50 % IV SOLN
INTRAVENOUS | Status: AC
  Administered 2018-01-18: 50 mL via INTRAVENOUS
  Filled 2018-01-18: qty 50

## 2018-01-18 MED ORDER — DEXTROSE 50 % IV SOLN
1.0000 | Freq: Once | INTRAVENOUS | Status: AC
  Administered 2018-01-18 (×2): 50 mL via INTRAVENOUS
  Filled 2018-01-18: qty 50

## 2018-01-18 NOTE — ED Triage Notes (Signed)
Pt arrives via ems from home. Ems states pt fell at home, found face down on floor. Pt placed in chair cbg of 28 initially. Ems reports giving 222mL of D10 pt received 25g dextrose. cbg recheck of 144 by ems. On arrival pt a&o x4, pt appears lethargic with eyes closed but very responsive when verbally addressed.

## 2018-01-18 NOTE — ED Notes (Signed)
Patient transported to CT 

## 2018-01-18 NOTE — ED Provider Notes (Addendum)
[facility] Emergency Department Provider Note   ____________________________________________   First MD Initiated Contact with Patient 01/18/18 1826     (approximate)  I have reviewed the triage vital signs and the nursing notes.   HISTORY  Chief Complaint Fall Chief complaint is syncope  HPI Matthew [name] is a 84 y.o. male who had his diabetic pill changed to couple weeks ago.  He passed out today and was found to have a blood sugar of 28.  Patient did hit his head.  He says he ate breakfast and lunch and describes what he ate.  He has a little bit of pain in the left side of his neck now but otherwise feels okay.  Is unsure why he passed out.  He does not remember what happened when he passed out.  Past Medical History:  Diagnosis Date  . Anemia   . Arthritis   . Broken arm 05/2013   Left  . CHF (congestive heart failure) ([place])   . Chronic kidney disease   . Coronary artery disease   . Diabetes mellitus without complication ([place])   . Dyspnea    DOE  . Edema    FEET/LEGS OCCAS  . Hypertension   . Myocardial infarction [facility])     Patient Active Problem List   Diagnosis Date Noted  . Fluid overload 01/10/2018  . Hypoglycemia 12/09/2017  . CAD (coronary artery disease) of bypass graft 12/04/2013  . Unstable angina; Class III Angina 12/03/2013  . Diabetes mellitus type 2, uncomplicated ([place]) 84/16/6063  . HLD (hyperlipidemia) 12/03/2013  . HTN (hypertension) 12/03/2013    Past Surgical History:  Procedure Laterality Date  . CATARACT EXTRACTION W/PHACO Left 07/26/2017   Procedure: CATARACT EXTRACTION PHACO AND INTRAOCULAR LENS PLACEMENT (IOC);  Surgeon: Birder Robson, MD;  Location: [facility] ORS;  Service: Ophthalmology;  Laterality: Left;  [place]   00:45.8 AP%  13.1 CDE  6.00 Fluid Pack Lot # [id number]  . CATARACT EXTRACTION W/PHACO Right 08/17/2017   Procedure: CATARACT EXTRACTION PHACO AND INTRAOCULAR LENS PLACEMENT (IOC);  Surgeon: Birder Robson, MD;  Location: [facility] ORS;  Service: Ophthalmology;  Laterality: Right;  [place]  01:30 AP% 16.8 CDE 15.24 Fluid pack lot # [id number]  . CHOLECYSTECTOMY    . CORONARY ANGIOPLASTY     STENTS  . CORONARY ARTERY BYPASS GRAFT    . FRACTURE SURGERY     ARM  . LEFT HEART CATHETERIZATION WITH CORONARY/GRAFT ANGIOGRAM Left 12/04/2013   Procedure: LEFT HEART CATHETERIZATION WITH Beatrix Fetters;  Surgeon: Leonie Man, MD;  Location: [facility] CATH LAB;  Service: Cardiovascular;  Laterality: Left;  . PERCUTANEOUS CORONARY STENT INTERVENTION (PCI-S) N/A 12/06/2013   Procedure: PERCUTANEOUS CORONARY STENT INTERVENTION (PCI-S);  Surgeon: Sinclair Grooms, MD;  Location:  [facility] CATH LAB;  Service: Cardiovascular;  Laterality: N/A;  . VEIN BYPASS SURGERY     [place] Bypass Surgery     Prior to Admission medications   Medication Sig Start Date End Date Taking? Authorizing Provider  amiodarone (PACERONE) 200 MG tablet Take 200 mg by mouth daily.    [provider]  aspirin EC 81 MG tablet Take 81 mg by mouth at bedtime.    [provider]  atorvastatin (LIPITOR) 40 MG tablet Take 40 mg by mouth at bedtime.    [provider]  carvedilol (COREG) 25 MG tablet Take 25 mg by mouth 2 (two) times daily with a meal.    [provider]  Cholecalciferol (VITAMIN D3) 2000 UNITS  TABS Take 2,000 Units by mouth daily with breakfast.    [provider]  clopidogrel (PLAVIX) 75 MG tablet Take 1 tablet (75 mg total) by mouth daily with breakfast. 12/07/13   Almyra Deforest, PA  fenofibrate 160 MG tablet Take 160 mg by mouth daily.    [provider]  FLOVENT HFA 110 MCG/ACT inhaler Inhale 2 puffs into the lungs daily. 12/01/17   [provider]  fluticasone (FLONASE) 50 MCG/ACT nasal spray Place 2 sprays into both nostrils at bedtime.    [provider]  furosemide (LASIX) 40 MG tablet Take 2 tablets (80 mg total) by mouth 2 (two) times daily. 01/15/18    Saundra Shelling, MD  glyBURIDE (DIABETA) 2.5 MG tablet Take 2.5 mg by mouth daily with breakfast.    [provider]  losartan (COZAAR) 100 MG tablet Take 1 tablet by mouth daily. 11/10/17   [provider]  Multiple Vitamins-Minerals (CENTRUM SILVER ADULT 50+) TABS Take 1 tablet by mouth daily with breakfast.    [provider]  nitroGLYCERIN (NITROSTAT) 0.4 MG SL tablet Place 0.4 mg under the tongue every 5 (five) minutes as needed for chest pain.    [provider]  Omega-3 Fatty Acids (FISH OIL) 1000 MG CAPS Take 4,000 mg by mouth daily at 12 noon.     [provider]  potassium chloride SA (K-DUR,KLOR-CON) 20 MEQ tablet Take 2 tablets (40 mEq total) by mouth daily. 01/16/18 02/15/18  Saundra Shelling, MD  PROAIR HFA 108 (90 Base) MCG/ACT inhaler Inhale 2 puffs into the lungs every 6 (six) hours as needed. 09/20/17   [provider]  zolpidem (AMBIEN) 5 MG tablet Take 2.5 mg by mouth at bedtime as needed for sleep.    [provider]    Allergies Amoxicillin  Family History  Problem Relation Age of Onset  . Heart attack Father 22       died first MI at age 79  . Heart failure Mother     Social History Social History   Tobacco Use  . Smoking status: Former Smoker    Types: Cigarettes  . Smokeless tobacco: Never Used  . Tobacco comment: quit 30 yrs ago, smoked 14-15 yrs, mainly pipe  Substance Use Topics  . Alcohol use: Yes    Alcohol/week: 1.0 standard drinks    Types: 1 Glasses of wine per week    Comment: occasionally drinking only  . Drug use: No    Review of Systems  Constitutional: No fever/chills Eyes: No visual changes. ENT: No sore throat. Cardiovascular: Denies chest pain. Respiratory: Denies shortness of breath. Gastrointestinal: No abdominal pain.  No nausea, no vomiting.  No diarrhea.  No constipation. Genitourinary: Negative for dysuria. Musculoskeletal: Negative for back pain. Skin: Negative for  rash. Neurological: Negative for headaches, focal weakness   ____________________________________________   PHYSICAL EXAM:  VITAL SIGNS: ED Triage Vitals  Enc Vitals Group     BP      Pulse      Resp      Temp      Temp src      SpO2      Weight      Height      Head Circumference      Peak Flow      Pain Score      Pain Loc      Pain Edu?      Excl. in [place]?     Constitutional: Alert and oriented.  Well appearing and in no acute distress. Eyes: Conjunctivae are normal.  Head: Abrasion on the left side of the head small cut on the occiput Nose: No congestion/rhinnorhea. Mouth/Throat: Mucous membranes are moist.  Oropharynx non-erythematous. Neck: No stridor. Cardiovascular: Normal rate, regular rhythm. Grossly normal heart sounds.  Good peripheral circulation. Respiratory: Normal respiratory effort.  No retractions. Lungs CTAB. Gastrointestinal: Soft and nontender. No distention. No abdominal bruits. No CVA tenderness. Musculoskeletal: No lower extremity tenderness 2+ edema. . Neurologic:  Normal speech and language. No gross focal neurologic deficits are appreciated. Skin:  Skin is warm, dry and intact. No rash noted. Psychiatric: Mood and affect are normal. Speech and behavior are normal.  ____________________________________________   LABS (all labs ordered are listed, but only abnormal results are displayed)  Labs Reviewed  CBC WITH DIFFERENTIAL/PLATELET - Abnormal; Notable for the following components:      Result Value   RBC 3.90 (*)    Hemoglobin 11.9 (*)    HCT 34.5 (*)    RDW 18.6 (*)    Lymphs Abs 0.4 (*)    All other components within normal limits  GLUCOSE, CAPILLARY - Abnormal; Notable for the following components:   Glucose-Capillary 104 (*)    All other components within normal limits  COMPREHENSIVE METABOLIC PANEL - Abnormal; Notable for the following components:   Glucose, Bld 63 (*)    BUN 89 (*)    Creatinine, Ser 3.91 (*)    AST 109 (*)      ALT 53 (*)    Total Bilirubin 1.8 (*)    GFR calc non Af Amer 13 (*)    GFR calc Af Amer 15 (*)    All other components within normal limits  GLUCOSE, CAPILLARY - Abnormal; Notable for the following components:   Glucose-Capillary 37 (*)    All other components within normal limits  TROPONIN I  CBG MONITORING, ED   ____________________________________________  EKG EKG read and interpreted by me shows very irregular baseline likely atrial fibrillation rate of 85 left bundle branch block left axis I do not see any acute ST-T wave changes  ____________________________________________  RADIOLOGY  ED MD interpretation: CT of the head and neck show chronic changes only  Official radiology report(s): Ct Head Wo Contrast  Result Date: 01/18/2018 CLINICAL DATA:  84 year old fall while with at home an unwitnessed, found face down on the floor. Initial encounter. EXAM: CT HEAD WITHOUT CONTRAST CT CERVICAL SPINE WITHOUT CONTRAST TECHNIQUE: Multidetector CT imaging of the head and cervical spine was performed following the standard protocol without intravenous contrast. Multiplanar CT image reconstructions of the cervical spine were also generated. COMPARISON:  None. FINDINGS: CT HEAD FINDINGS Brain: Severe age related cortical atrophy, moderate deep atrophy and mild cerebellar atrophy. Arachnoid cyst involving the RIGHT MIDDLE cranial fossa. Mild to moderate changes of small vessel disease of the white matter. No mass lesion. No midline shift. No acute hemorrhage or hematoma. No extra-axial fluid collections. No evidence of acute infarction. Vascular: Moderate BILATERAL carotid siphon and BILATERAL vertebral artery atherosclerosis. Extensive intracranial atherosclerosis. No hyperdense vessel. Skull: No skull fracture or other focal osseous abnormality involving the skull. Sinuses/Orbits: Mucous retention cyst or polyp in the LEFT maxillary sinus and in a LEFT POSTERIOR ethmoid air cell. Remaining  visualized paranasal sinuses, BILATERAL mastoid air cells and BILATERAL middle ear cavities well-aerated. Visualized orbits and globes intact. Other: None. CT CERVICAL SPINE FINDINGS Alignment: Anatomic POSTERIOR alignment. Straightening of the usual cervical lordosis. Skull base and vertebrae: No  fractures identified involving the cervical spine. Facet joints intact with degenerative changes throughout. Coronal reformatted images demonstrate an intact craniocervical junction, intact dens and intact lateral masses throughout. Soft tissues and spinal canal: No paraspinal or canal hematoma. Moderate spinal stenosis at C4-5 and C5-6 and mild spinal stenosis at C6-7. Disc levels: Acquired fusion of the C2 and C3 vertebral bodies with congenital fusion of the vertebral bodies and POSTERIOR elements of C3 and C4. 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 Disc space narrowing and endplate hypertrophic changes at every level from C4-5 through C7-T1. Facet and uncinate hypertrophy account for multilevel foraminal stenoses including severe RIGHT and mild LEFT C2-3, severe LEFT and moderate RIGHT C4-5, severe BILATERAL C6-7. Upper chest: Visualized lung apices clear. Visualized superior mediastinum normal. Other: None. IMPRESSION: CT Head: 1. No acute intracranial abnormality. 2. Severe age related cortical atrophy, moderate deep atrophy and mild cerebellar atrophy. Mild to moderate chronic microvascular ischemic changes of the white matter. 3. Benign arachnoid cyst involving the RIGHT MIDDLE cranial fossa. CT Cervical Spine: 1. No cervical spine fractures identified. 2. Multilevel degenerative disc disease, spondylosis and facet degenerative changes with multilevel foraminal stenoses as detailed above. 3. Moderate multifactorial spinal stenosis at C4-5 and C5-6 and mild multifactorial spinal stenosis at C6-7. Electronically Signed    By: Evangeline Dakin M.D.   On: 01/18/2018 19:05   Ct Cervical Spine Wo Contrast  Result Date: 01/18/2018 CLINICAL DATA:  84 year old fall while with at home an unwitnessed, found face down on the floor. Initial encounter. EXAM: CT HEAD WITHOUT CONTRAST CT CERVICAL SPINE WITHOUT CONTRAST TECHNIQUE: Multidetector CT imaging of the head and cervical spine was performed following the standard protocol without intravenous contrast. Multiplanar CT image reconstructions of the cervical spine were also generated. COMPARISON:  None. FINDINGS: CT HEAD FINDINGS Brain: Severe age related cortical atrophy, moderate deep atrophy and mild cerebellar atrophy. Arachnoid cyst involving the RIGHT MIDDLE cranial fossa. Mild to moderate changes of small vessel disease of the white matter. No mass lesion. No midline shift. No acute hemorrhage or hematoma. No extra-axial fluid collections. No evidence of acute infarction. Vascular: Moderate BILATERAL carotid siphon and BILATERAL vertebral artery atherosclerosis. Extensive intracranial atherosclerosis. No hyperdense vessel. Skull: No skull fracture or other focal osseous abnormality involving the skull. Sinuses/Orbits: Mucous retention cyst or polyp in the LEFT maxillary sinus and in a LEFT POSTERIOR ethmoid air cell. Remaining visualized paranasal sinuses, BILATERAL mastoid air cells and BILATERAL middle ear cavities well-aerated. Visualized orbits and globes intact. Other: None. CT CERVICAL SPINE FINDINGS Alignment: Anatomic POSTERIOR alignment. Straightening of the usual cervical lordosis. Skull base and vertebrae: No fractures identified involving the cervical spine. Facet joints intact with degenerative changes throughout. Coronal reformatted images demonstrate an intact craniocervical junction, intact dens and intact lateral masses throughout. Soft tissues and spinal canal: No paraspinal or canal hematoma. Moderate spinal stenosis at C4-5 and C5-6 and mild spinal stenosis at  C6-7. Disc levels: Acquired fusion of the C2 and C3 vertebral bodies with congenital fusion of the vertebral bodies and POSTERIOR elements of C3 and C4. 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 Disc space narrowing and endplate hypertrophic changes at every level from C4-5 through C7-T1. Facet and uncinate hypertrophy account for multilevel foraminal stenoses including severe RIGHT and mild LEFT C2-3, severe LEFT and moderate RIGHT C4-5, severe BILATERAL C6-7. Upper chest: Visualized  lung apices clear. Visualized superior mediastinum normal. Other: None. IMPRESSION: CT Head: 1. No acute intracranial abnormality. 2. Severe age related cortical atrophy, moderate deep atrophy and mild cerebellar atrophy. Mild to moderate chronic microvascular ischemic changes of the white matter. 3. Benign arachnoid cyst involving the RIGHT MIDDLE cranial fossa. CT Cervical Spine: 1. No cervical spine fractures identified. 2. Multilevel degenerative disc disease, spondylosis and facet degenerative changes with multilevel foraminal stenoses as detailed above. 3. Moderate multifactorial spinal stenosis at C4-5 and C5-6 and mild multifactorial spinal stenosis at C6-7. Electronically Signed   By: Evangeline Dakin M.D.   On: 01/18/2018 19:05    ____________________________________________   PROCEDURES  Procedure(s) performed:   Procedures  Critical Care performed:   ____________________________________________   INITIAL IMPRESSION / ASSESSMENT AND PLAN / ED COURSE  Patient gets sleepy again.  Fingerstick shows his glucose is dropped again this is the second time it is dropped.  Patient has been eating well today.   ----------------------------------------- 10:15 PM on 01/18/2018 -----------------------------------------  Patient is hypoglycemic again this is the third time in spite of eating even here in the emergency room  getting D50 twice sugars dropping again.  Last time he did this it took 3 days of hospitalization to make him better.  I do not want a risk of sending him home and having his blood sugar drop      ____________________________________________   FINAL CLINICAL IMPRESSION(S) / ED DIAGNOSES  Final diagnoses:  Hypoglycemia     ED Discharge Orders    None       Note:  This document was prepared using Dragon voice recognition software and may include unintentional dictation errors.    Nena Polio, MD 01/18/18 2002    Nena Polio, MD 01/18/18 2215

## 2018-01-18 NOTE — Patient Outreach (Signed)
[facility]) Care Management  01/18/2018  Matthew [name] 22-May-1931 [id number]   EMMI-  General discharge      RED ON EMMI ALERT Day # 1 Date: 01/17/18 Red Alert Reason: new prescriptions? I don't know    Outreach attempt #1 Outreach attempt to patient. No answer and unable to leave voicemail message.  Plan: [facility] RN CM sent an unsuccessful outreach letter and scheduled this patient for another call attempt within 4 business days  Kimberly L. Lavina Hamman, RN, BSN, [place] Management Care Coordinator Direct Number [phone] Mobile number [phone]  Main [facility] number [phone] Fax number [phone]

## 2018-01-18 NOTE — H&P (Signed)
[facility] NAME: Duward Allbritton    MR#:  [id number]  DATE OF BIRTH:  1930-06-23  DATE OF ADMISSION:  01/18/2018  PRIMARY CARE PHYSICIAN: Jodi Marble, MD   REQUESTING/REFERRING PHYSICIAN:   CHIEF COMPLAINT:   Chief Complaint  Patient presents with  . Fall    HISTORY OF PRESENT ILLNESS: Rondale Nies  is a 84 y.o. male with a known history of CHF, CKD 4, coronary artery disease, diabetes type 2, hypertension and other comorbidities. Mr. Fudala is confused and lethargic, secondary to hypoglycemia.  He is unable to provide any history.  Most of the information was obtained from reviewing the medical records and from discussion with emergency room physician and the family. Patient was brought to emergency room for syncope and fall at home, related to low blood sugar.  When paramedics arrived the blood sugar was low at 28.  He is noted to have a hematoma at the left forehead area, secondary to fall.  No reports of fever or chills, no nausea, no vomiting, no diarrhea, no bleeding. Blood test done emergency room are notable for creatinine level at 3.9, BUN is 89. CT of the head and cervical spine are negative for any acute intracranial abnormality or fractures. She is admitted for further evaluation and treatment.  PAST MEDICAL HISTORY:   Past Medical History:  Diagnosis Date  . Anemia   . Arthritis   . Broken arm 05/2013   Left  . CHF (congestive heart failure) ([place])   . Chronic kidney disease   . Coronary artery disease   . Diabetes mellitus without complication ([place])   . Dyspnea    DOE  . Edema    FEET/LEGS OCCAS  . Hypertension   . Myocardial infarction [facility])     PAST SURGICAL HISTORY:  Past Surgical History:  Procedure Laterality Date  . CATARACT EXTRACTION W/PHACO Left 07/26/2017   Procedure: CATARACT EXTRACTION PHACO AND INTRAOCULAR LENS PLACEMENT (IOC);  Surgeon: Birder Robson, MD;  Location: [facility]  ORS;  Service: Ophthalmology;  Laterality: Left;  [place]   00:45.8 AP%  13.1 CDE  6.00 Fluid Pack Lot # [id number]  . CATARACT EXTRACTION W/PHACO Right 08/17/2017   Procedure: CATARACT EXTRACTION PHACO AND INTRAOCULAR LENS PLACEMENT (IOC);  Surgeon: Birder Robson, MD;  Location: [facility] ORS;  Service: Ophthalmology;  Laterality: Right;  [place]  01:30 AP% 16.8 CDE 15.24 Fluid pack lot # [id number]  . CHOLECYSTECTOMY    . CORONARY ANGIOPLASTY     STENTS  . CORONARY ARTERY BYPASS GRAFT    . FRACTURE SURGERY     ARM  . LEFT HEART CATHETERIZATION WITH CORONARY/GRAFT ANGIOGRAM Left 12/04/2013   Procedure: LEFT HEART CATHETERIZATION WITH Beatrix Fetters;  Surgeon: Leonie Man, MD;  Location: [facility] CATH LAB;  Service: Cardiovascular;  Laterality: Left;  . PERCUTANEOUS CORONARY STENT INTERVENTION (PCI-S) N/A 12/06/2013   Procedure: PERCUTANEOUS CORONARY STENT INTERVENTION (PCI-S);  Surgeon: Sinclair Grooms, MD;  Location: [facility] CATH LAB;  Service: Cardiovascular;  Laterality: N/A;  . VEIN BYPASS SURGERY     [place] [facility]     SOCIAL HISTORY:  Social History   Tobacco Use  . Smoking status: Former Smoker    Types: Cigarettes  . Smokeless tobacco: Never Used  . Tobacco comment: quit 30 yrs ago, smoked 14-15 yrs, mainly pipe  Substance Use Topics  . Alcohol use: Yes    Alcohol/week: 1.0 standard drinks    Types: 1 Glasses  of wine per week    Comment: occasionally drinking only    FAMILY HISTORY:  Family History  Problem Relation Age of Onset  . Heart attack Father 31       died first MI at age 87  . Heart failure Mother     DRUG ALLERGIES:  Allergies  Allergen Reactions  . Amoxicillin Diarrhea    REVIEW OF SYSTEMS:   Unable to obtain due to patient's confusion.  MEDICATIONS AT HOME:  Prior to Admission medications   Medication Sig Start Date End Date Taking? Authorizing Provider  amiodarone (PACERONE) 200 MG tablet Take 200 mg by mouth daily.   Yes [provider]  aspirin EC 81 MG tablet Take 81 mg by mouth at bedtime.   Yes [provider]  atorvastatin (LIPITOR) 40 MG tablet Take 40 mg by mouth at bedtime.   Yes [provider]  carvedilol (COREG) 25 MG tablet Take 25 mg by mouth 2 (two) times daily with a meal.   Yes [provider]  Cholecalciferol (VITAMIN D3) 2000 UNITS TABS Take 2,000 Units by mouth daily with breakfast.   Yes [provider]  clopidogrel (PLAVIX) 75 MG tablet Take 1 tablet (75 mg total) by mouth daily with breakfast. 12/07/13  Yes Almyra Deforest, PA  fenofibrate 160 MG tablet Take 160 mg by mouth daily.   Yes [provider]  furosemide (LASIX) 40 MG tablet Take 2 tablets (80 mg total) by mouth 2 (two) times daily. 01/15/18  Yes Pyreddy, Reatha Harps, MD  losartan (COZAAR) 100 MG tablet Take 1 tablet by mouth daily. 11/10/17  Yes [provider]  Multiple Vitamins-Minerals (CENTRUM SILVER ADULT 50+) TABS Take 1 tablet by mouth daily with breakfast.   Yes [provider]  Omega-3 Fatty Acids (FISH OIL) 1000 MG CAPS Take 4,000 mg by mouth daily at 12 noon.    Yes [provider]  potassium chloride SA (K-DUR,KLOR-CON) 20 MEQ tablet Take 2 tablets (40 mEq total) by mouth daily. 01/16/18 02/15/18 Yes Pyreddy, Reatha Harps, MD  FLOVENT HFA 110 MCG/ACT inhaler Inhale 2 puffs into the lungs daily. 12/01/17   [provider]  fluticasone (FLONASE) 50 MCG/ACT nasal spray Place 2 sprays into both nostrils at bedtime.    [provider]  nitroGLYCERIN (NITROSTAT) 0.4 MG SL tablet Place 0.4 mg under the tongue every 5 (five) minutes as needed for chest pain.    [provider]  PROAIR HFA 108 [phone] Base) MCG/ACT inhaler Inhale 2 puffs into the lungs every 6 (six) hours as needed. 09/20/17   [provider]  zolpidem (AMBIEN) 5 MG tablet Take 2.5 mg by mouth at bedtime as needed for sleep.    [provider]      PHYSICAL EXAMINATION:    VITAL SIGNS: Blood pressure 126/63, pulse 99, temperature 97.8 F (36.6 C), temperature source Oral, resp. rate (!) 26, height 6' (1.829 m), weight 95.7 kg, SpO2 95 %.  GENERAL:  84 y.o.-year-old patient lying in the bed with no acute distress, but lethargic and confused.  EYES: Pupils equal, round, reactive to light and accommodation. No scleral icterus.  HEENT: Head atraumatic, normocephalic. Oropharynx and nasopharynx clear.  NECK:  Supple, no jugular venous distention. No thyroid enlargement, no tenderness.  LUNGS: Reduced breath sounds bilaterally, no wheezing, rales,rhonchi or crepitation. No use of accessory muscles of respiration.  CARDIOVASCULAR: S1, S2 normal. No S3/S4.  ABDOMEN: Soft, nontender, nondistended. Bowel sounds present. No organomegaly or mass.  EXTREMITIES:  No pedal edema, cyanosis, or clubbing.  NEUROLOGIC: No focal weakness appreciated PSYCHIATRIC: The patient is alert, but confused, lethargic.  SKIN: No obvious rash, lesion, or ulcer.   LABORATORY PANEL:   CBC Recent Labs  Lab 01/18/18 1834  WBC 5.7  HGB 11.9*  HCT 34.5*  PLT 213  MCV 88.4  MCH 30.4  MCHC 34.4  RDW 18.6*  LYMPHSABS 0.4*  MONOABS 0.7  EOSABS 0.2  BASOSABS 0.0   ------------------------------------------------------------------------------------------------------------------  Chemistries  Recent Labs  Lab 01/13/18 0418 01/14/18 0353 01/15/18 0548 01/18/18 1906  NA 141 141 139 135  K 3.8 3.8 3.7 4.5  CL 100 98 97* 99  CO2 30 31 32 26  GLUCOSE 106* 103* 110* 63*  BUN 86* 84* 87* 89*  CREATININE 3.48* 3.43* 3.43* 3.91*  CALCIUM 9.1 9.0 9.3 9.0  AST  --   --   --  109*  ALT  --   --   --  53*  ALKPHOS  --   --   --  74  BILITOT  --   --   --  1.8*   ------------------------------------------------------------------------------------------------------------------ estimated creatinine clearance is 16.6 mL/min (A) (by C-G formula based on SCr of 3.91 mg/dL  (H)). ------------------------------------------------------------------------------------------------------------------ No results for input(s): TSH, T4TOTAL, T3FREE, THYROIDAB in the last 72 hours.  Invalid input(s): FREET3   Coagulation profile No results for input(s): INR, PROTIME in the last 168 hours. ------------------------------------------------------------------------------------------------------------------- No results for input(s): DDIMER in the last 72 hours. -------------------------------------------------------------------------------------------------------------------  Cardiac Enzymes Recent Labs  Lab 01/18/18 1906  TROPONINI <0.03   ------------------------------------------------------------------------------------------------------------------ Invalid input(s): POCBNP  ---------------------------------------------------------------------------------------------------------------  Urinalysis    Component Value Date/Time   COLORURINE YELLOW (A) 12/09/2017 1510   APPEARANCEUR HAZY (A) 12/09/2017 1510   LABSPEC 1.009 12/09/2017 1510   PHURINE 5.0 12/09/2017 1510   GLUCOSEU NEGATIVE 12/09/2017 1510   HGBUR NEGATIVE 12/09/2017 1510   BILIRUBINUR NEGATIVE 12/09/2017 1510   KETONESUR NEGATIVE 12/09/2017 1510   PROTEINUR NEGATIVE 12/09/2017 1510   NITRITE NEGATIVE 12/09/2017 1510   LEUKOCYTESUR NEGATIVE 12/09/2017 1510     RADIOLOGY: Ct Head Wo Contrast  Result Date: 01/18/2018 CLINICAL DATA:  84 year old fall while with at home an unwitnessed, found face down on the floor. Initial encounter. EXAM: CT HEAD WITHOUT CONTRAST CT CERVICAL SPINE WITHOUT CONTRAST TECHNIQUE: Multidetector CT imaging of the head and cervical spine was performed following the standard protocol without intravenous contrast. Multiplanar CT image reconstructions of the cervical spine were also generated. COMPARISON:  None. FINDINGS: CT HEAD FINDINGS Brain: Severe age related cortical  atrophy, moderate deep atrophy and mild cerebellar atrophy. Arachnoid cyst involving the RIGHT MIDDLE cranial fossa. Mild to moderate changes of small vessel disease of the white matter. No mass lesion. No midline shift. No acute hemorrhage or hematoma. No extra-axial fluid collections. No evidence of acute infarction. Vascular: Moderate BILATERAL carotid siphon and BILATERAL vertebral artery atherosclerosis. Extensive intracranial atherosclerosis. No hyperdense vessel. Skull: No skull fracture or other focal osseous abnormality involving the skull. Sinuses/Orbits: Mucous retention cyst or polyp in the LEFT maxillary sinus and in a LEFT POSTERIOR ethmoid air cell. Remaining visualized paranasal sinuses, BILATERAL mastoid air cells and BILATERAL middle ear cavities well-aerated. Visualized orbits and globes intact. Other: None. CT CERVICAL SPINE FINDINGS Alignment: Anatomic POSTERIOR alignment. Straightening of the usual cervical lordosis. Skull base and vertebrae: No fractures identified involving the cervical spine. Facet joints intact with degenerative changes throughout. Coronal reformatted images demonstrate an intact craniocervical junction, intact dens and intact lateral masses  throughout. Soft tissues and spinal canal: No paraspinal or canal hematoma. Moderate spinal stenosis at C4-5 and C5-6 and mild spinal stenosis at C6-7. Disc levels: Acquired fusion of the C2 and C3 vertebral bodies with congenital fusion of the vertebral bodies and POSTERIOR elements of C3 and C4. 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 Disc space narrowing and endplate hypertrophic changes at every level from C4-5 through C7-T1. Facet and uncinate hypertrophy account for multilevel foraminal stenoses including severe RIGHT and mild LEFT C2-3, severe LEFT and moderate RIGHT C4-5, severe BILATERAL C6-7. Upper chest: Visualized lung apices clear.  Visualized superior mediastinum normal. Other: None. IMPRESSION: CT Head: 1. No acute intracranial abnormality. 2. Severe age related cortical atrophy, moderate deep atrophy and mild cerebellar atrophy. Mild to moderate chronic microvascular ischemic changes of the white matter. 3. Benign arachnoid cyst involving the RIGHT MIDDLE cranial fossa. CT Cervical Spine: 1. No cervical spine fractures identified. 2. Multilevel degenerative disc disease, spondylosis and facet degenerative changes with multilevel foraminal stenoses as detailed above. 3. Moderate multifactorial spinal stenosis at C4-5 and C5-6 and mild multifactorial spinal stenosis at C6-7. Electronically Signed   By: Evangeline Dakin M.D.   On: 01/18/2018 19:05   Ct Cervical Spine Wo Contrast  Result Date: 01/18/2018 CLINICAL DATA:  84 year old fall while with at home an unwitnessed, found face down on the floor. Initial encounter. EXAM: CT HEAD WITHOUT CONTRAST CT CERVICAL SPINE WITHOUT CONTRAST TECHNIQUE: Multidetector CT imaging of the head and cervical spine was performed following the standard protocol without intravenous contrast. Multiplanar CT image reconstructions of the cervical spine were also generated. COMPARISON:  None. FINDINGS: CT HEAD FINDINGS Brain: Severe age related cortical atrophy, moderate deep atrophy and mild cerebellar atrophy. Arachnoid cyst involving the RIGHT MIDDLE cranial fossa. Mild to moderate changes of small vessel disease of the white matter. No mass lesion. No midline shift. No acute hemorrhage or hematoma. No extra-axial fluid collections. No evidence of acute infarction. Vascular: Moderate BILATERAL carotid siphon and BILATERAL vertebral artery atherosclerosis. Extensive intracranial atherosclerosis. No hyperdense vessel. Skull: No skull fracture or other focal osseous abnormality involving the skull. Sinuses/Orbits: Mucous retention cyst or polyp in the LEFT maxillary sinus and in a LEFT POSTERIOR ethmoid air cell.  Remaining visualized paranasal sinuses, BILATERAL mastoid air cells and BILATERAL middle ear cavities well-aerated. Visualized orbits and globes intact. Other: None. CT CERVICAL SPINE FINDINGS Alignment: Anatomic POSTERIOR alignment. Straightening of the usual cervical lordosis. Skull base and vertebrae: No fractures identified involving the cervical spine. Facet joints intact with degenerative changes throughout. Coronal reformatted images demonstrate an intact craniocervical junction, intact dens and intact lateral masses throughout. Soft tissues and spinal canal: No paraspinal or canal hematoma. Moderate spinal stenosis at C4-5 and C5-6 and mild spinal stenosis at C6-7. Disc levels: Acquired fusion of the C2 and C3 vertebral bodies with congenital fusion of the vertebral bodies and POSTERIOR elements of C3 and C4. 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 Disc space narrowing and endplate hypertrophic changes at every level from C4-5 through C7-T1. Facet and uncinate hypertrophy account for multilevel foraminal stenoses including severe RIGHT and mild LEFT C2-3, severe LEFT and moderate RIGHT C4-5, severe BILATERAL C6-7. Upper chest: Visualized lung apices clear. Visualized superior mediastinum normal. Other: None. IMPRESSION: CT Head: 1. No acute intracranial abnormality. 2. Severe age related cortical atrophy, moderate deep atrophy and  mild cerebellar atrophy. Mild to moderate chronic microvascular ischemic changes of the white matter. 3. Benign arachnoid cyst involving the RIGHT MIDDLE cranial fossa. CT Cervical Spine: 1. No cervical spine fractures identified. 2. Multilevel degenerative disc disease, spondylosis and facet degenerative changes with multilevel foraminal stenoses as detailed above. 3. Moderate multifactorial spinal stenosis at C4-5 and C5-6 and mild multifactorial spinal stenosis at C6-7. Electronically  Signed   By: Evangeline Dakin M.D.   On: 01/18/2018 19:05    EKG: Orders placed or performed during the hospital encounter of 01/18/18  . ED EKG  . ED EKG    IMPRESSION AND PLAN:  1.  Acute metabolic encephalopathy, secondary to hypoglycemia.  We will continue to monitor clinically closely, while treating the underlying disorder. 2.  Diabetes type 2 with hypoglycemia, secondary to glipizide use in the setting of CKD 4.  Improving after 1 amp of D50 IV.  Will discontinue oral antidiabetic medications in this patient.  He is to continue diet and exercise regimen for his diabetes.  Will monitor blood sugars every 2 hours for now and use D50 IV as needed.  Start low-carb diet. 3.  CKD 4, at baseline.  Creatinine is 3.9.  Continue to monitor kidney function closely and avoid nephrotoxic medications. 4.  Systolic CHF, EF at 73%, currently clinically compensated.  Will continue maintenance therapy.  Will avoid IV fluids.  All the records are reviewed and case discussed with ED provider. Management plans discussed with the patient, family and they are in agreement.  CODE STATUS: DNR Code Status History    Date Active Date Inactive Code Status Order ID Comments User Context   01/10/2018 1248 01/15/2018 1546 DNR [id number]  Loletha Grayer, MD Inpatient   01/10/2018 1224 01/10/2018 1248 Full Code [id number]  Loletha Grayer, MD Inpatient   12/09/2017 1835 12/11/2017 1508 Full Code [id number]  Loletha Grayer, MD Inpatient   12/06/2013 1125 12/07/2013 1536 Full Code [id number]  Belva Crome, MD Inpatient   12/04/2013 0955 12/06/2013 1125 Full Code [id number]  Leonie Man, MD Inpatient   12/03/2013 0407 12/04/2013 0955 Full Code [id number]  Rise Patience, MD Inpatient    Questions for Most Recent Historical Code Status (Order [id number])    Question Answer Comment   In the event of cardiac or respiratory ARREST Do not call a "code blue"    In the event of cardiac or respiratory ARREST Do not  perform Intubation, CPR, defibrillation or ACLS    In the event of cardiac or respiratory ARREST Use medication by any route, position, wound care, and other measures to relive pain and suffering. May use oxygen, suction and manual treatment of airway obstruction as needed for comfort.    Comments nurse may pronounce         Advance Directive Documentation     Most Recent Value  Type of Advance Directive  Healthcare Power of Attorney, Living will  Pre-existing out of facility DNR order (yellow form or pink MOST form)  -  "MOST" Form in Place?  -       TOTAL TIME TAKING CARE OF THIS PATIENT: 45 minutes.    Amelia Jo M.D on 01/18/2018 at 11:21 PM  Between 7am to 6pm - Pager - [phone]  After 6pm go to [email or web address] - password [email or web address] [facility] [place] [facility]  [phone]  CC: Primary care physician; Jodi Marble, MD

## 2018-01-19 ENCOUNTER — Other Ambulatory Visit: Payer: Self-pay

## 2018-01-19 ENCOUNTER — Other Ambulatory Visit: Payer: Self-pay | Admitting: *Deleted

## 2018-01-19 LAB — BASIC METABOLIC PANEL
Anion gap: 10 (ref 5–15)
BUN: 88 mg/dL — AB (ref 8–23)
CALCIUM: 9.1 mg/dL (ref 8.9–10.3)
CO2: 28 mmol/L (ref 22–32)
Chloride: 101 mmol/L (ref 98–111)
Creatinine, Ser: 3.75 mg/dL — ABNORMAL HIGH (ref 0.61–1.24)
GFR calc Af Amer: 16 mL/min — ABNORMAL LOW (ref >60)
GFR calc non Af Amer: 13 mL/min — ABNORMAL LOW (ref >60)
GLUCOSE: 78 mg/dL (ref 70–99)
Potassium: 4.5 mmol/L (ref 3.5–5.1)
Sodium: 139 mmol/L (ref 135–145)

## 2018-01-19 LAB — GLUCOSE, CAPILLARY
GLUCOSE-CAPILLARY: 75 mg/dL (ref 70–99)
GLUCOSE-CAPILLARY: 31 mg/dL — AB (ref 70–99)
GLUCOSE-CAPILLARY: 83 mg/dL (ref 70–99)
GLUCOSE-CAPILLARY: 71 mg/dL (ref 70–99)
Glucose-Capillary: 94 mg/dL (ref 70–99)
Glucose-Capillary: 48 mg/dL — ABNORMAL LOW (ref 70–99)
Glucose-Capillary: 61 mg/dL — ABNORMAL LOW (ref 70–99)
Glucose-Capillary: 99 mg/dL (ref 70–99)
Glucose-Capillary: 163 mg/dL — ABNORMAL HIGH (ref 70–99)
Glucose-Capillary: 31 mg/dL — CL (ref 70–99)
Glucose-Capillary: 172 mg/dL — ABNORMAL HIGH (ref 70–99)

## 2018-01-19 LAB — CBC
HEMATOCRIT: 32.2 % — AB (ref 40.0–52.0)
Hemoglobin: 10.8 g/dL — ABNORMAL LOW (ref 13.0–18.0)
MCH: 29.5 pg (ref 26.0–34.0)
MCHC: 33.6 g/dL (ref 32.0–36.0)
MCV: 88 fL (ref 80.0–100.0)
Platelets: 119 10*3/uL — ABNORMAL LOW (ref 150–440)
RBC: 3.66 MIL/uL — ABNORMAL LOW (ref 4.40–5.90)
RDW: 18.2 % — AB (ref 11.5–14.5)
WBC: 5.5 10*3/uL (ref 3.8–10.6)

## 2018-01-19 MED ORDER — NITROGLYCERIN 0.4 MG SL SUBL: 0.4000 mg | SUBLINGUAL_TABLET | SUBLINGUAL | Status: DC | PRN

## 2018-01-19 MED ORDER — FUROSEMIDE 40 MG PO TABS
80.0000 mg | ORAL_TABLET | Freq: Two times a day (BID) | ORAL | Status: DC
  Filled 2018-01-19: qty 2

## 2018-01-19 MED ORDER — HYDROCODONE-ACETAMINOPHEN 5-325 MG PO TABS
1.0000 | ORAL_TABLET | ORAL | Status: DC | PRN
  Filled 2018-01-19: qty 2

## 2018-01-19 MED ORDER — ONDANSETRON HCL 4 MG PO TABS: 4.0000 mg | ORAL_TABLET | Freq: Four times a day (QID) | ORAL | Status: DC | PRN

## 2018-01-19 MED ORDER — ATORVASTATIN CALCIUM 20 MG PO TABS
40.0000 mg | ORAL_TABLET | Freq: Every day | ORAL | Status: DC
  Administered 2018-01-19: 21:00:00 40 mg via ORAL
  Filled 2018-01-19: qty 2

## 2018-01-19 MED ORDER — FUROSEMIDE 40 MG PO TABS
80.0000 mg | ORAL_TABLET | Freq: Two times a day (BID) | ORAL | Status: DC
  Administered 2018-01-19 – 2018-01-20 (×2): 80 mg via ORAL
  Filled 2018-01-19 (×2): qty 2

## 2018-01-19 MED ORDER — DOCUSATE SODIUM 100 MG PO CAPS
100.0000 mg | ORAL_CAPSULE | Freq: Two times a day (BID) | ORAL | Status: DC
  Administered 2018-01-19 – 2018-01-20 (×2): 100 mg via ORAL
  Filled 2018-01-19 (×2): qty 1

## 2018-01-19 MED ORDER — ADULT MULTIVITAMIN W/MINERALS CH
1.0000 | ORAL_TABLET | Freq: Every day | ORAL | Status: DC
  Administered 2018-01-19 – 2018-01-20 (×2): 1 via ORAL
  Filled 2018-01-19 (×2): qty 1

## 2018-01-19 MED ORDER — FLUTICASONE PROPIONATE 50 MCG/ACT NA SUSP: 2.0000 | Freq: Every day | NASAL | Status: DC

## 2018-01-19 MED ORDER — DEXTROSE 50 % IV SOLN
50.0000 mL | Freq: Once | INTRAVENOUS | Status: AC
  Administered 2018-01-19: 17:00:00 50 mL via INTRAVENOUS
  Filled 2018-01-19: qty 50

## 2018-01-19 MED ORDER — TRAZODONE HCL 50 MG PO TABS: 25.0000 mg | ORAL_TABLET | Freq: Every evening | ORAL | Status: DC | PRN

## 2018-01-19 MED ORDER — ALBUTEROL SULFATE (2.5 MG/3ML) 0.083% IN NEBU: 3.0000 mL | INHALATION_SOLUTION | RESPIRATORY_TRACT | Status: DC | PRN

## 2018-01-19 MED ORDER — FENOFIBRATE 160 MG PO TABS
160.0000 mg | ORAL_TABLET | Freq: Every day | ORAL | Status: DC
  Administered 2018-01-19 – 2018-01-20 (×2): 160 mg via ORAL
  Filled 2018-01-19 (×2): qty 1

## 2018-01-19 MED ORDER — DEXTROSE 50 % IV SOLN
INTRAVENOUS | Status: AC
  Administered 2018-01-19: 12:00:00
  Filled 2018-01-19: qty 50

## 2018-01-19 MED ORDER — SODIUM CHLORIDE 0.9 % IV SOLN: INTRAVENOUS | Status: DC

## 2018-01-19 MED ORDER — FLUTICASONE PROPIONATE HFA 110 MCG/ACT IN AERO: 2.0000 | INHALATION_SPRAY | Freq: Every day | RESPIRATORY_TRACT | Status: DC

## 2018-01-19 MED ORDER — LOSARTAN POTASSIUM 50 MG PO TABS: 100.0000 mg | ORAL_TABLET | Freq: Every day | ORAL | Status: DC

## 2018-01-19 MED ORDER — LOSARTAN POTASSIUM 50 MG PO TABS
100.0000 mg | ORAL_TABLET | Freq: Every day | ORAL | Status: DC
  Filled 2018-01-19: qty 2

## 2018-01-19 MED ORDER — CLOPIDOGREL BISULFATE 75 MG PO TABS
75.0000 mg | ORAL_TABLET | Freq: Every day | ORAL | Status: DC
  Administered 2018-01-19 – 2018-01-20 (×2): 75 mg via ORAL
  Filled 2018-01-19 (×2): qty 1

## 2018-01-19 MED ORDER — POTASSIUM CHLORIDE CRYS ER 20 MEQ PO TBCR
40.0000 meq | EXTENDED_RELEASE_TABLET | Freq: Every day | ORAL | Status: DC
  Filled 2018-01-19 (×2): qty 2

## 2018-01-19 MED ORDER — ASPIRIN EC 81 MG PO TBEC
81.0000 mg | DELAYED_RELEASE_TABLET | Freq: Every day | ORAL | Status: DC
  Administered 2018-01-19: 81 mg via ORAL
  Filled 2018-01-19: qty 1

## 2018-01-19 MED ORDER — VITAMIN D 1000 UNITS PO TABS
2000.0000 [IU] | ORAL_TABLET | Freq: Every day | ORAL | Status: DC
  Administered 2018-01-19 – 2018-01-20 (×2): 2000 [IU] via ORAL
  Filled 2018-01-19 (×2): qty 2

## 2018-01-19 MED ORDER — ZOLPIDEM TARTRATE 5 MG PO TABS: 2.5000 mg | ORAL_TABLET | Freq: Every evening | ORAL | Status: DC | PRN

## 2018-01-19 MED ORDER — DEXTROSE 10 % IV SOLN
INTRAVENOUS | Status: DC
  Administered 2018-01-20: 06:00:00 60 mL/h via INTRAVENOUS

## 2018-01-19 MED ORDER — CARVEDILOL 25 MG PO TABS
25.0000 mg | ORAL_TABLET | Freq: Two times a day (BID) | ORAL | Status: DC
  Filled 2018-01-19: qty 1

## 2018-01-19 MED ORDER — ACETAMINOPHEN 325 MG PO TABS: 650.0000 mg | ORAL_TABLET | Freq: Four times a day (QID) | ORAL | Status: DC | PRN

## 2018-01-19 MED ORDER — DEXTROSE 10 % IV SOLN
INTRAVENOUS | Status: DC
  Administered 2018-01-19: 13:00:00 via INTRAVENOUS

## 2018-01-19 MED ORDER — OMEGA-3-ACID ETHYL ESTERS 1 G PO CAPS
1.0000 g | ORAL_CAPSULE | Freq: Every day | ORAL | Status: DC
  Administered 2018-01-19 – 2018-01-20 (×2): 1 g via ORAL
  Filled 2018-01-19 (×2): qty 1

## 2018-01-19 MED ORDER — AMIODARONE HCL 200 MG PO TABS
200.0000 mg | ORAL_TABLET | Freq: Every day | ORAL | Status: DC
  Administered 2018-01-19 – 2018-01-20 (×2): 200 mg via ORAL
  Filled 2018-01-19 (×2): qty 1

## 2018-01-19 MED ORDER — FLUTICASONE PROPIONATE 50 MCG/ACT NA SUSP
2.0000 | Freq: Every day | NASAL | Status: DC | PRN
  Filled 2018-01-19: qty 16

## 2018-01-19 MED ORDER — BISACODYL 5 MG PO TBEC
5.0000 mg | DELAYED_RELEASE_TABLET | Freq: Every day | ORAL | Status: DC | PRN
  Administered 2018-01-19: 01:00:00 5 mg via ORAL
  Filled 2018-01-19: qty 1

## 2018-01-19 MED ORDER — ONDANSETRON HCL 4 MG/2ML IJ SOLN: 4.0000 mg | Freq: Four times a day (QID) | INTRAMUSCULAR | Status: DC | PRN

## 2018-01-19 MED ORDER — BUDESONIDE 0.25 MG/2ML IN SUSP
0.2500 mg | Freq: Two times a day (BID) | RESPIRATORY_TRACT | Status: DC
  Administered 2018-01-19: 0.25 mg via RESPIRATORY_TRACT
  Filled 2018-01-19: qty 2

## 2018-01-19 MED ORDER — ACETAMINOPHEN 650 MG RE SUPP: 650.0000 mg | Freq: Four times a day (QID) | RECTAL | Status: DC | PRN

## 2018-01-19 MED ORDER — HEPARIN SODIUM (PORCINE) 5000 UNIT/ML IJ SOLN
5000.0000 [IU] | Freq: Three times a day (TID) | INTRAMUSCULAR | Status: DC
  Administered 2018-01-19 – 2018-01-20 (×4): 5000 [IU] via SUBCUTANEOUS
  Filled 2018-01-19 (×4): qty 1

## 2018-01-19 NOTE — Progress Notes (Addendum)
Inpatient Diabetes Program Recommendations  AACE/ADA: New Consensus Statement on Inpatient Glycemic Control (2015)  Target Ranges:  Prepandial:   less than 140 mg/dL      Peak postprandial:   less than 180 mg/dL (1-2 hours)      Critically ill patients:  140 - 180 mg/dL   Results for Matthew [name], Matthew [name] (MRN [id number]) as of 01/19/2018 11:57  Ref. Range 01/18/2018 18:28 01/18/2018 19:59 01/18/2018 20:15 01/18/2018 22:10 01/18/2018 22:55  Glucose-Capillary Latest Ref Range: 70 - 99 mg/dL 104 (H) 37 (LL) 128 (H) 50 (L) 126 (H)   Results for Matthew [name], Matthew [name] (MRN [id number]) as of 01/19/2018 11:57  Ref. Range 01/19/2018 00:40 01/19/2018 02:30 01/19/2018 07:54 01/19/2018 08:28 01/19/2018 09:25 01/19/2018 11:48  Glucose-Capillary Latest Ref Range: 70 - 99 mg/dL 99 94 48 (L) 61 (L) 75 31 (LL)     Admit with: Hypoglycemia with Fall  History: DM, CKD, CHF  Home DM Meds: Glyburide 2.5 mg daily  Current Orders: None yet     Per record review, it looks as if patient was discharged from [facility] on 12/11/2017 and was told to stop his home DM medication Glipizide.  Then on discharge summary from 01/15/2018, patient had order to continue to take Glyburide 2.5 mg daily.    Was taken off the Sulfonylurea medication and then restarted on another one a month later.  Not sure why?  Not sure if patient has been taking the Glyburide at home?      MD- Do not recommend patient take a sulfonylurea drug at home for diabetes.  May be able to manage solely with diet and exercise.  Last A1c on file was 5.8% back on 12/09/2017.  Given his elevated creatinine and CKD, do not recommend Diabetes Meds for home at this time.    Addendum 2:30pm- Met with pt and his 2 daughters this afternoon.  Pt's dtrs told me that pt was taking Glipizide back in July.  Was hospitalized and was taken off the Glipizide b/c pt was having significant Hypoglycemia.  Was discharged on no medications from that admission.  A week later, pt saw his PCP  (per dtr's report) and was started back on a different oral DM medication but from the same class (Glyburide).  Now back in the hospital with Hypoglycemia.  Discussed with pt and daughters that pt is at an increased risk of Hypoglycemia b/c of his poor renal function.  Medications from the Sulfonylurea class (Glipizide and Glyburide) put pt at higher risk of Hypoglycemia as well since these two meds stimulate pancreatic release of insulin.  Pt's dtrs told me they plan to cook all meals for pt to keep him on a better carbohydrate modified diet in order to keep pt's CBGs under better control.  Pt's dtrs also told me Dr. Darvin Neighbours plans to d/c home oral meds for now and have pt continue to monitor CBGs at home.  Discussed with pt's dtrs that there are other classes of DM meds available that may be safer alternatives for home use if patient needs an oral DM medication added back at some point.  For now, it may be best to have pt stay off oral DM meds and just monitor CBGs for his trends.  Encouraged pt's dtrs to please monitor pt's CBGs closely and if they notice pt is having sustained hyperglycemia (CBGs consistently >200 mg/dl at home), to please inquire with pt's PCP about alternative medications like a low dose DPP-inhibitor medication like Tradjenta 5 mg daily.     --  Will follow patient during hospitalization--  Wyn Quaker RN, MSN, CDE Diabetes Coordinator Inpatient Glycemic Control Team Team Pager: [phone] (8a-5p)

## 2018-01-19 NOTE — Patient Outreach (Signed)
[facility]) Care Management  01/19/2018  Matthew [name] Jul 07, 1930 [id number]   Care coordination   [facility] RN CM received Epic ADT of pt admission to [facility] on 01/18/18 after a fall/syncope at home related to hypoglycemia [facility] RN CM updated [facility] hospital liaison  Plan Closed EMMI general discharge referral at this time as [name] has been hospitalized   [place]. Lavina Hamman, RN, BSN, [place] Coordinator Office number [phone] Mobile number [phone]  Main [facility] number [phone] Fax number [phone]

## 2018-01-19 NOTE — Progress Notes (Signed)
[facility] [place] NAME: Matthew [name]    MR#:  [id number]  DATE OF BIRTH:  [birth year]  SUBJECTIVE:  CHIEF COMPLAINT:   Chief Complaint  Patient presents with  . Fall   Sitting in a chair.  Shaking due to hypoglycemia earlier.  Blood sugar found to be 34.  REVIEW OF SYSTEMS:    Review of Systems  Unable to perform ROS: Mental status change    DRUG ALLERGIES:   Allergies  Allergen Reactions  . Amoxicillin Diarrhea    VITALS:  Blood pressure (!) 110/53, pulse 72, temperature 98 F (36.7 C), temperature source Oral, resp. rate 18, height 6' (1.829 m), weight 95.7 kg, SpO2 97 %.  PHYSICAL EXAMINATION:   Physical Exam  GENERAL:  84 y.o.-year-old patient lying in the bed with no acute distress.  EYES: Pupils equal, round, reactive to light and accommodation. No scleral icterus. Extraocular muscles intact.  HEENT: Head atraumatic, normocephalic. Oropharynx and nasopharynx clear.  NECK:  Supple, no jugular venous distention. No thyroid enlargement, no tenderness.  LUNGS: Normal breath sounds bilaterally, no wheezing, rales, rhonchi. No use of accessory muscles of respiration.  CARDIOVASCULAR: S1, S2 normal. No murmurs, rubs, or gallops.  ABDOMEN: Soft, nontender, nondistended. Bowel sounds present. No organomegaly or mass.  EXTREMITIES: No cyanosis, clubbing.  Bilateral lower extremity edema NEUROLOGIC: Cranial nerves II through XII are intact. No focal Motor or sensory deficits b/l.   PSYCHIATRIC: The patient is alert and awake SKIN: No obvious rash, lesion, or ulcer.   LABORATORY PANEL:   CBC Recent Labs  Lab 01/19/18 0323  WBC 5.5  HGB 10.8*  HCT 32.2*  PLT 119*   ------------------------------------------------------------------------------------------------------------------ Chemistries  Recent Labs  Lab 01/18/18 1906 01/19/18 0323  NA 135 139  K 4.5 4.5  CL 99 101  CO2 26 28  GLUCOSE 63* 78  BUN 89* 88*   CREATININE 3.91* 3.75*  CALCIUM 9.0 9.1  AST 109*  --   ALT 53*  --   ALKPHOS 74  --   BILITOT 1.8*  --    ------------------------------------------------------------------------------------------------------------------  Cardiac Enzymes Recent Labs  Lab 01/18/18 1906  TROPONINI <0.03   ------------------------------------------------------------------------------------------------------------------  RADIOLOGY:  Ct Head Wo Contrast  Result Date: 01/18/2018 CLINICAL DATA:  84 year old fall while with at home an unwitnessed, found face down on the floor. Initial encounter. EXAM: CT HEAD WITHOUT CONTRAST CT CERVICAL SPINE WITHOUT CONTRAST TECHNIQUE: Multidetector CT imaging of the head and cervical spine was performed following the standard protocol without intravenous contrast. Multiplanar CT image reconstructions of the cervical spine were also generated. COMPARISON:  None. FINDINGS: CT HEAD FINDINGS Brain: Severe age related cortical atrophy, moderate deep atrophy and mild cerebellar atrophy. Arachnoid cyst involving the RIGHT MIDDLE cranial fossa. Mild to moderate changes of small vessel disease of the white matter. No mass lesion. No midline shift. No acute hemorrhage or hematoma. No extra-axial fluid collections. No evidence of acute infarction. Vascular: Moderate BILATERAL carotid siphon and BILATERAL vertebral artery atherosclerosis. Extensive intracranial atherosclerosis. No hyperdense vessel. Skull: No skull fracture or other focal osseous abnormality involving the skull. Sinuses/Orbits: Mucous retention cyst or polyp in the LEFT maxillary sinus and in a LEFT POSTERIOR ethmoid air cell. Remaining visualized paranasal sinuses, BILATERAL mastoid air cells and BILATERAL middle ear cavities well-aerated. Visualized orbits and globes intact. Other: None. CT CERVICAL SPINE FINDINGS Alignment: Anatomic POSTERIOR alignment. Straightening of the usual cervical lordosis. Skull base and vertebrae:  No fractures identified involving the  cervical spine. Facet joints intact with degenerative changes throughout. Coronal reformatted images demonstrate an intact craniocervical junction, intact dens and intact lateral masses throughout. Soft tissues and spinal canal: No paraspinal or canal hematoma. Moderate spinal stenosis at C4-5 and C5-6 and mild spinal stenosis at C6-7. Disc levels: Acquired fusion of the C2 and C3 vertebral bodies with congenital fusion of the vertebral bodies and POSTERIOR elements of C3 and C4. 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 Disc space narrowing and endplate hypertrophic changes at every level from C4-5 through C7-T1. Facet and uncinate hypertrophy account for multilevel foraminal stenoses including severe RIGHT and mild LEFT C2-3, severe LEFT and moderate RIGHT C4-5, severe BILATERAL C6-7. Upper chest: Visualized lung apices clear. Visualized superior mediastinum normal. Other: None. IMPRESSION: CT Head: 1. No acute intracranial abnormality. 2. Severe age related cortical atrophy, moderate deep atrophy and mild cerebellar atrophy. Mild to moderate chronic microvascular ischemic changes of the white matter. 3. Benign arachnoid cyst involving the RIGHT MIDDLE cranial fossa. CT Cervical Spine: 1. No cervical spine fractures identified. 2. Multilevel degenerative disc disease, spondylosis and facet degenerative changes with multilevel foraminal stenoses as detailed above. 3. Moderate multifactorial spinal stenosis at C4-5 and C5-6 and mild multifactorial spinal stenosis at C6-7. Electronically Signed   By: Evangeline Dakin M.D.   On: 01/18/2018 19:05   Ct Cervical Spine Wo Contrast  Result Date: 01/18/2018 CLINICAL DATA:  84 year old fall while with at home an unwitnessed, found face down on the floor. Initial encounter. EXAM: CT HEAD WITHOUT CONTRAST CT CERVICAL SPINE WITHOUT CONTRAST  TECHNIQUE: Multidetector CT imaging of the head and cervical spine was performed following the standard protocol without intravenous contrast. Multiplanar CT image reconstructions of the cervical spine were also generated. COMPARISON:  None. FINDINGS: CT HEAD FINDINGS Brain: Severe age related cortical atrophy, moderate deep atrophy and mild cerebellar atrophy. Arachnoid cyst involving the RIGHT MIDDLE cranial fossa. Mild to moderate changes of small vessel disease of the white matter. No mass lesion. No midline shift. No acute hemorrhage or hematoma. No extra-axial fluid collections. No evidence of acute infarction. Vascular: Moderate BILATERAL carotid siphon and BILATERAL vertebral artery atherosclerosis. Extensive intracranial atherosclerosis. No hyperdense vessel. Skull: No skull fracture or other focal osseous abnormality involving the skull. Sinuses/Orbits: Mucous retention cyst or polyp in the LEFT maxillary sinus and in a LEFT POSTERIOR ethmoid air cell. Remaining visualized paranasal sinuses, BILATERAL mastoid air cells and BILATERAL middle ear cavities well-aerated. Visualized orbits and globes intact. Other: None. CT CERVICAL SPINE FINDINGS Alignment: Anatomic POSTERIOR alignment. Straightening of the usual cervical lordosis. Skull base and vertebrae: No fractures identified involving the cervical spine. Facet joints intact with degenerative changes throughout. Coronal reformatted images demonstrate an intact craniocervical junction, intact dens and intact lateral masses throughout. Soft tissues and spinal canal: No paraspinal or canal hematoma. Moderate spinal stenosis at C4-5 and C5-6 and mild spinal stenosis at C6-7. Disc levels: Acquired fusion of the C2 and C3 vertebral bodies with congenital fusion of the vertebral bodies and POSTERIOR elements of C3 and C4. 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 Disc  space narrowing and endplate hypertrophic changes at every level from C4-5 through C7-T1. Facet and uncinate hypertrophy account for multilevel foraminal stenoses including severe RIGHT and mild LEFT C2-3, severe LEFT and moderate RIGHT C4-5, severe BILATERAL C6-7. Upper chest: Visualized lung apices clear. Visualized  superior mediastinum normal. Other: None. IMPRESSION: CT Head: 1. No acute intracranial abnormality. 2. Severe age related cortical atrophy, moderate deep atrophy and mild cerebellar atrophy. Mild to moderate chronic microvascular ischemic changes of the white matter. 3. Benign arachnoid cyst involving the RIGHT MIDDLE cranial fossa. CT Cervical Spine: 1. No cervical spine fractures identified. 2. Multilevel degenerative disc disease, spondylosis and facet degenerative changes with multilevel foraminal stenoses as detailed above. 3. Moderate multifactorial spinal stenosis at C4-5 and C5-6 and mild multifactorial spinal stenosis at C6-7. Electronically Signed   By: Evangeline Dakin M.D.   On: 01/18/2018 19:05     ASSESSMENT AND PLAN:   *Hypoglycemia secondary to oral hypoglycemics.  Medications held. Blood sugar continues to be low at 34 and symptomatic.  Stat dose of D50 given.  Will start D10 at 30 mL/h.  Continue Accu-Cheks. Will need to stop diabetes medications at discharge.  *Acute metabolic encephalopathy due to hypoglycemia.  Improving.  *CKD stage IV.  At baseline.  Monitor.  *Chronic systolic CHF.  Ejection fraction 45%.  Held Coreg and losartan due to low normal blood pressure.  Lasix held.  Will restart once blood pressure improves.  All the records are reviewed and case discussed with Care Management/Social Worker Management plans discussed with the patient, family and they are in agreement.  CODE STATUS: DNR  DVT Prophylaxis: SCDs  TOTAL CC TIME TAKING CARE OF THIS PATIENT: 35 minutes.   POSSIBLE D/C IN 1-2 DAYS, DEPENDING ON CLINICAL CONDITION.  Matthew [name]  M.D on 01/19/2018 at 1:02 PM  Between 7am to 6pm - Pager - [phone]  After 6pm go to [email or web address] - password [email or web address] [facility]  Office  [phone]  CC: Primary care physician; Jodi Marble, MD  Note: This dictation was prepared with Dragon dictation along with smaller phrase technology. Any transcriptional errors that result from this process are unintentional.

## 2018-01-20 LAB — CBC
HCT: 32.3 % — ABNORMAL LOW (ref 40.0–52.0)
Hemoglobin: 11 g/dL — ABNORMAL LOW (ref 13.0–18.0)
MCH: 30.1 pg (ref 26.0–34.0)
MCHC: 34.2 g/dL (ref 32.0–36.0)
MCV: 88 fL (ref 80.0–100.0)
PLATELETS: 127 10*3/uL — AB (ref 150–440)
RBC: 3.67 MIL/uL — AB (ref 4.40–5.90)
RDW: 18.6 % — ABNORMAL HIGH (ref 11.5–14.5)
WBC: 5.4 10*3/uL (ref 3.8–10.6)

## 2018-01-20 LAB — GLUCOSE, CAPILLARY
GLUCOSE-CAPILLARY: 122 mg/dL — AB (ref 70–99)
GLUCOSE-CAPILLARY: 127 mg/dL — AB (ref 70–99)
GLUCOSE-CAPILLARY: 133 mg/dL — AB (ref 70–99)
GLUCOSE-CAPILLARY: 34 mg/dL — AB (ref 70–99)
GLUCOSE-CAPILLARY: 95 mg/dL (ref 70–99)
Glucose-Capillary: 151 mg/dL — ABNORMAL HIGH (ref 70–99)
Glucose-Capillary: 124 mg/dL — ABNORMAL HIGH (ref 70–99)

## 2018-01-20 MED ORDER — CARVEDILOL 3.125 MG PO TABS: 3.1250 mg | ORAL_TABLET | Freq: Two times a day (BID) | ORAL | 0 refills | Status: DC

## 2018-01-20 NOTE — Care Management Important Message (Signed)
Important Message  Patient Details  Name: Matthew [name] MRN: [id number] Date of Birth: 1931/04/10   Medicare Important Message Given:  Yes    Juliann Pulse A Fredy Gladu 01/20/2018, 10:51 AM

## 2018-01-20 NOTE — Plan of Care (Signed)
  Problem: Health Behavior/Discharge Planning: Goal: Ability to manage health-related needs will improve Outcome: Progressing   Problem: Clinical Measurements: Goal: Ability to maintain clinical measurements within normal limits will improve Outcome: Progressing Goal: Will remain free from infection Outcome: Progressing Goal: Diagnostic test results will improve Outcome: Progressing Goal: Respiratory complications will improve Outcome: Progressing   Problem: Activity: Goal: Risk for activity intolerance will decrease Outcome: Progressing   Problem: Coping: Goal: Level of anxiety will decrease Outcome: Progressing   Problem: Elimination: Goal: Will not experience complications related to bowel motility Outcome: Progressing   Problem: Pain Managment: Goal: General experience of comfort will improve Outcome: Progressing   Problem: Safety: Goal: Ability to remain free from injury will improve Outcome: Progressing   Problem: Skin Integrity: Goal: Risk for impaired skin integrity will decrease Outcome: Progressing   

## 2018-01-20 NOTE — Progress Notes (Signed)
Patient discharged home with family. Discharge instruction provided to patient.family. All questions answered and concerns addressed. Patient/family verbalized understanding.

## 2018-01-20 NOTE — Discharge Instructions (Signed)
Please monitor your blood sugars at home.    May be best to check three times daily before meals to gather sufficient blood sugar data.  If you notice your blood sugars are consistently greater than 200 mg/dl, please call your PCP to inquire about alternative oral medications.

## 2018-01-20 NOTE — Plan of Care (Signed)
  Problem: Health Behavior/Discharge Planning: Goal: Ability to manage health-related needs will improve Outcome: Adequate for Discharge   Problem: Clinical Measurements: Goal: Ability to maintain clinical measurements within normal limits will improve Outcome: Adequate for Discharge Goal: Will remain free from infection Outcome: Adequate for Discharge Goal: Diagnostic test results will improve Outcome: Adequate for Discharge Goal: Respiratory complications will improve Outcome: Adequate for Discharge   Problem: Activity: Goal: Risk for activity intolerance will decrease Outcome: Adequate for Discharge   Problem: Coping: Goal: Level of anxiety will decrease Outcome: Adequate for Discharge   Problem: Elimination: Goal: Will not experience complications related to bowel motility Outcome: Adequate for Discharge   Problem: Pain Managment: Goal: General experience of comfort will improve Outcome: Adequate for Discharge   Problem: Safety: Goal: Ability to remain free from injury will improve Outcome: Adequate for Discharge   Problem: Skin Integrity: Goal: Risk for impaired skin integrity will decrease Outcome: Adequate for Discharge

## 2018-01-24 DIAGNOSIS — I10 Essential (primary) hypertension: Secondary | ICD-10-CM | POA: Diagnosis not present

## 2018-01-24 DIAGNOSIS — R0602 Shortness of breath: Secondary | ICD-10-CM | POA: Diagnosis not present

## 2018-01-24 DIAGNOSIS — I25810 Atherosclerosis of coronary artery bypass graft(s) without angina pectoris: Secondary | ICD-10-CM | POA: Diagnosis not present

## 2018-01-24 DIAGNOSIS — I4891 Unspecified atrial fibrillation: Secondary | ICD-10-CM | POA: Diagnosis not present

## 2018-01-24 DIAGNOSIS — I255 Ischemic cardiomyopathy: Secondary | ICD-10-CM | POA: Diagnosis not present

## 2018-01-24 DIAGNOSIS — I2510 Atherosclerotic heart disease of native coronary artery without angina pectoris: Secondary | ICD-10-CM | POA: Diagnosis not present

## 2018-01-24 DIAGNOSIS — I429 Cardiomyopathy, unspecified: Secondary | ICD-10-CM | POA: Insufficient documentation

## 2018-01-24 DIAGNOSIS — I509 Heart failure, unspecified: Secondary | ICD-10-CM | POA: Diagnosis not present

## 2018-01-24 DIAGNOSIS — E785 Hyperlipidemia, unspecified: Secondary | ICD-10-CM | POA: Diagnosis not present

## 2018-01-26 DIAGNOSIS — N179 Acute kidney failure, unspecified: Secondary | ICD-10-CM | POA: Diagnosis not present

## 2018-01-26 DIAGNOSIS — I5022 Chronic systolic (congestive) heart failure: Secondary | ICD-10-CM | POA: Diagnosis not present

## 2018-01-26 DIAGNOSIS — N184 Chronic kidney disease, stage 4 (severe): Secondary | ICD-10-CM | POA: Diagnosis not present

## 2018-01-26 DIAGNOSIS — N2581 Secondary hyperparathyroidism of renal origin: Secondary | ICD-10-CM | POA: Diagnosis not present

## 2018-01-26 DIAGNOSIS — E1122 Type 2 diabetes mellitus with diabetic chronic kidney disease: Secondary | ICD-10-CM | POA: Diagnosis not present

## 2018-01-26 DIAGNOSIS — D631 Anemia in chronic kidney disease: Secondary | ICD-10-CM | POA: Diagnosis not present

## 2018-01-30 DIAGNOSIS — I110 Hypertensive heart disease with heart failure: Secondary | ICD-10-CM | POA: Diagnosis not present

## 2018-01-30 DIAGNOSIS — E119 Type 2 diabetes mellitus without complications: Secondary | ICD-10-CM | POA: Diagnosis not present

## 2018-01-30 DIAGNOSIS — M170 Bilateral primary osteoarthritis of knee: Secondary | ICD-10-CM | POA: Diagnosis not present

## 2018-01-30 DIAGNOSIS — I5030 Unspecified diastolic (congestive) heart failure: Secondary | ICD-10-CM | POA: Diagnosis not present

## 2018-01-30 DIAGNOSIS — S41112D Laceration without foreign body of left upper arm, subsequent encounter: Secondary | ICD-10-CM | POA: Diagnosis not present

## 2018-01-30 DIAGNOSIS — I2510 Atherosclerotic heart disease of native coronary artery without angina pectoris: Secondary | ICD-10-CM | POA: Diagnosis not present

## 2018-01-31 NOTE — Discharge Summary (Signed)
[facility] [place] NAME: Aramis Weil    MR#:  [id number]  DATE OF BIRTH:  Feb 10, 1931  DATE OF ADMISSION:  01/18/2018 ADMITTING PHYSICIAN: Amelia Jo, MD  DATE OF DISCHARGE: 01/20/2018  2:00 PM  PRIMARY CARE PHYSICIAN: Jodi Marble, MD   ADMISSION DIAGNOSIS:  Hypoglycemia [E16.2]  DISCHARGE DIAGNOSIS:  Active Problems:   Hypoglycemia   SECONDARY DIAGNOSIS:   Past Medical History:  Diagnosis Date  . Anemia   . Arthritis   . Broken arm 05/2013   Left  . CHF (congestive heart failure) ([place])   . Chronic kidney disease   . Coronary artery disease   . Diabetes mellitus without complication ([place])   . Dyspnea    DOE  . Edema    FEET/LEGS OCCAS  . Hypertension   . Myocardial infarction [facility])      ADMITTING HISTORY  HISTORY OF PRESENT ILLNESS: Meshach Perry  is a 84 y.o. male with a known history of CHF, CKD 4, coronary artery disease, diabetes type 2, hypertension and other comorbidities. Mr. Zapf is confused and lethargic, secondary to hypoglycemia.  He is unable to provide any history.  Most of the information was obtained from reviewing the medical records and from discussion with emergency room physician and the family. Patient was brought to emergency room for syncope and fall at home, related to low blood sugar.  When paramedics arrived the blood sugar was low at 28.  He is noted to have a hematoma at the left forehead area, secondary to fall.  No reports of fever or chills, no nausea, no vomiting, no diarrhea, no bleeding. Blood test done emergency room are notable for creatinine level at 3.9, BUN is 89. CT of the head and cervical spine are negative for any acute intracranial abnormality or fractures. She is admitted for further evaluation and treatment.  HOSPITAL COURSE:    *Hypoglycemia secondary to oral hypoglycemics.  Medications held. Patient needed multiple doses of D50 and D10 started.  By day of  discharge blood sugars are in the normal range.  His oral hypoglycemics are being stopped at discharge.  *Acute metabolic encephalopathy due to hypoglycemia.    Resolved.  *CKD stage IV.  At baseline.  Monitor.  *Chronic systolic CHF.  Ejection fraction 45%.   Coreg, losartan held on admission.  Restarted low-dose Coreg. Follow-up with primary care physician and cardiology as outpatient  Stable for discharge home.  CONSULTS OBTAINED:    DRUG ALLERGIES:   Allergies  Allergen Reactions  . Amoxicillin Diarrhea    DISCHARGE MEDICATIONS:   Allergies as of 01/20/2018      Reactions   Amoxicillin Diarrhea      Medication List    STOP taking these medications   losartan 100 MG tablet Commonly known as:  COZAAR     TAKE these medications   amiodarone 200 MG tablet Commonly known as:  PACERONE Take 200 mg by mouth daily.   aspirin EC 81 MG tablet Take 81 mg by mouth at bedtime.   atorvastatin 40 MG tablet Commonly known as:  LIPITOR Take 40 mg by mouth at bedtime.   carvedilol 3.125 MG tablet Commonly known as:  COREG Take 1 tablet (3.125 mg total) by mouth 2 (two) times daily with a meal. What changed:    medication strength  how much to take   CENTRUM SILVER ADULT 50+ Tabs Take 1 tablet by mouth daily with breakfast.   clopidogrel 75 MG  tablet Commonly known as:  PLAVIX Take 1 tablet (75 mg total) by mouth daily with breakfast.   fenofibrate 160 MG tablet Take 160 mg by mouth daily.   Fish Oil 1000 MG Caps Take 4,000 mg by mouth daily at 12 noon.   FLOVENT HFA 110 MCG/ACT inhaler Generic drug:  fluticasone Inhale 2 puffs into the lungs daily.   fluticasone 50 MCG/ACT nasal spray Commonly known as:  FLONASE Place 2 sprays into both nostrils at bedtime.   furosemide 40 MG tablet Commonly known as:  LASIX Take 2 tablets (80 mg total) by mouth 2 (two) times daily.   nitroGLYCERIN 0.4 MG SL tablet Commonly known as:  NITROSTAT Place 0.4 mg  under the tongue every 5 (five) minutes as needed for chest pain.   potassium chloride SA 20 MEQ tablet Commonly known as:  K-DUR,KLOR-CON Take 2 tablets (40 mEq total) by mouth daily.   PROAIR HFA 108 (90 Base) MCG/ACT inhaler Generic drug:  albuterol Inhale 2 puffs into the lungs every 6 (six) hours as needed.   Vitamin D3 2000 units Tabs Take 2,000 Units by mouth daily with breakfast.   zolpidem 5 MG tablet Commonly known as:  AMBIEN Take 2.5 mg by mouth at bedtime as needed for sleep.       Today   VITAL SIGNS:  Blood pressure (!) 99/53, pulse 70, temperature 97.9 F (36.6 C), temperature source Oral, resp. rate 16, height 6' (1.829 m), weight 111.1 kg, SpO2 98 %.  I/O:  No intake or output data in the 24 hours ending 01/31/18 1251  PHYSICAL EXAMINATION:  Physical Exam  GENERAL:  84 y.o.-year-old patient lying in the bed with no acute distress.  LUNGS: Normal breath sounds bilaterally, no wheezing, rales,rhonchi or crepitation. No use of accessory muscles of respiration.  CARDIOVASCULAR: S1, S2 normal. No murmurs, rubs, or gallops.  ABDOMEN: Soft, non-tender, non-distended. Bowel sounds present. No organomegaly or mass.  NEUROLOGIC: Moves all 4 extremities. PSYCHIATRIC: The patient is alert and oriented x 3.  SKIN: No obvious rash, lesion, or ulcer.   DATA REVIEW:   CBC No results for input(s): WBC, HGB, HCT, PLT in the last 168 hours.  Chemistries  No results for input(s): NA, K, CL, CO2, GLUCOSE, BUN, CREATININE, CALCIUM, MG, AST, ALT, ALKPHOS, BILITOT in the last 168 hours.  Invalid input(s): GFRCGP  Cardiac Enzymes No results for input(s): TROPONINI in the last 168 hours.  Microbiology Results  No results found for this or any previous visit.  RADIOLOGY:  No results found.  Follow up with PCP in 1 week.  Management plans discussed with the patient, family and they are in agreement.  CODE STATUS:  Code Status History    Date Active Date Inactive  Code Status Order ID Comments User Context   01/19/2018 0104 01/20/2018 1812 DNR [id number]  Amelia Jo, MD Inpatient   01/10/2018 1248 01/15/2018 1546 DNR [id number]  Loletha Grayer, MD Inpatient   01/10/2018 1224 01/10/2018 1248 Full Code [id number]  Loletha Grayer, MD Inpatient   12/09/2017 1835 12/11/2017 1508 Full Code [id number]  Loletha Grayer, MD Inpatient   12/06/2013 1125 12/07/2013 1536 Full Code [id number]  Belva Crome, MD Inpatient   12/04/2013 0955 12/06/2013 1125 Full Code [id number]  Leonie Man, MD Inpatient   12/03/2013 0407 12/04/2013 0955 Full Code [id number]  Rise Patience, MD Inpatient    Questions for Most Recent Historical Code Status (Order [id number])    Question Answer Comment   In the  event of cardiac or respiratory ARREST Do not call a "code blue"    In the event of cardiac or respiratory ARREST Do not perform Intubation, CPR, defibrillation or ACLS    In the event of cardiac or respiratory ARREST Use medication by any route, position, wound care, and other measures to relive pain and suffering. May use oxygen, suction and manual treatment of airway obstruction as needed for comfort.    Comments nurse may pronounce         Advance Directive Documentation     Most Recent Value  Type of Advance Directive  Healthcare Power of Attorney, Living will  Pre-existing out of facility DNR order (yellow form or pink MOST form)  -  "MOST" Form in Place?  -      TOTAL TIME TAKING CARE OF THIS PATIENT ON DAY OF DISCHARGE: more than 30 minutes.   Leia Alf Layanna Charo M.D on 01/31/2018 at 12:51 PM  Between 7am to 6pm - Pager - [phone]  After 6pm go to [email or web address] - password [email or web address] [facility]  Office  [phone]  CC: Primary care physician; Jodi Marble, MD  Note: This dictation was prepared with Dragon dictation along with smaller phrase technology. Any transcriptional errors that result from this process are unintentional.

## 2018-02-02 DIAGNOSIS — M170 Bilateral primary osteoarthritis of knee: Secondary | ICD-10-CM | POA: Diagnosis not present

## 2018-02-02 DIAGNOSIS — S41112D Laceration without foreign body of left upper arm, subsequent encounter: Secondary | ICD-10-CM | POA: Diagnosis not present

## 2018-02-02 DIAGNOSIS — I110 Hypertensive heart disease with heart failure: Secondary | ICD-10-CM | POA: Diagnosis not present

## 2018-02-02 DIAGNOSIS — I2510 Atherosclerotic heart disease of native coronary artery without angina pectoris: Secondary | ICD-10-CM | POA: Diagnosis not present

## 2018-02-02 DIAGNOSIS — E119 Type 2 diabetes mellitus without complications: Secondary | ICD-10-CM | POA: Diagnosis not present

## 2018-02-02 DIAGNOSIS — I5030 Unspecified diastolic (congestive) heart failure: Secondary | ICD-10-CM | POA: Diagnosis not present

## 2018-02-06 DIAGNOSIS — I110 Hypertensive heart disease with heart failure: Secondary | ICD-10-CM | POA: Diagnosis not present

## 2018-02-06 DIAGNOSIS — S41112D Laceration without foreign body of left upper arm, subsequent encounter: Secondary | ICD-10-CM | POA: Diagnosis not present

## 2018-02-06 DIAGNOSIS — N2581 Secondary hyperparathyroidism of renal origin: Secondary | ICD-10-CM | POA: Diagnosis not present

## 2018-02-06 DIAGNOSIS — I2510 Atherosclerotic heart disease of native coronary artery without angina pectoris: Secondary | ICD-10-CM | POA: Diagnosis not present

## 2018-02-06 DIAGNOSIS — N186 End stage renal disease: Secondary | ICD-10-CM | POA: Diagnosis not present

## 2018-02-06 DIAGNOSIS — I5030 Unspecified diastolic (congestive) heart failure: Secondary | ICD-10-CM | POA: Diagnosis not present

## 2018-02-06 DIAGNOSIS — D631 Anemia in chronic kidney disease: Secondary | ICD-10-CM | POA: Diagnosis not present

## 2018-02-06 DIAGNOSIS — E119 Type 2 diabetes mellitus without complications: Secondary | ICD-10-CM | POA: Diagnosis not present

## 2018-02-06 DIAGNOSIS — M170 Bilateral primary osteoarthritis of knee: Secondary | ICD-10-CM | POA: Diagnosis not present

## 2018-02-06 DIAGNOSIS — E1122 Type 2 diabetes mellitus with diabetic chronic kidney disease: Secondary | ICD-10-CM | POA: Diagnosis not present

## 2018-02-06 DIAGNOSIS — I5022 Chronic systolic (congestive) heart failure: Secondary | ICD-10-CM | POA: Diagnosis not present

## 2018-02-08 DIAGNOSIS — I2510 Atherosclerotic heart disease of native coronary artery without angina pectoris: Secondary | ICD-10-CM | POA: Diagnosis not present

## 2018-02-08 DIAGNOSIS — E119 Type 2 diabetes mellitus without complications: Secondary | ICD-10-CM | POA: Diagnosis not present

## 2018-02-08 DIAGNOSIS — S41112D Laceration without foreign body of left upper arm, subsequent encounter: Secondary | ICD-10-CM | POA: Diagnosis not present

## 2018-02-08 DIAGNOSIS — M170 Bilateral primary osteoarthritis of knee: Secondary | ICD-10-CM | POA: Diagnosis not present

## 2018-02-08 DIAGNOSIS — I5030 Unspecified diastolic (congestive) heart failure: Secondary | ICD-10-CM | POA: Diagnosis not present

## 2018-02-08 DIAGNOSIS — I110 Hypertensive heart disease with heart failure: Secondary | ICD-10-CM | POA: Diagnosis not present

## 2018-02-09 ENCOUNTER — Ambulatory Visit
Admission: RE | Admit: 2018-02-09 | Discharge: 2018-02-09 | Disposition: A | Discharge status: Home / Self Care | Payer: Medicare Other | Source: Ambulatory Visit | Attending: Vascular Surgery | Admitting: Vascular Surgery

## 2018-02-09 ENCOUNTER — Encounter
Admission: RE | Disposition: A | Discharge status: Home / Self Care | Payer: Self-pay | Source: Ambulatory Visit | Attending: Vascular Surgery

## 2018-02-09 ENCOUNTER — Other Ambulatory Visit (INDEPENDENT_AMBULATORY_CARE_PROVIDER_SITE_OTHER): Payer: Self-pay | Admitting: Vascular Surgery

## 2018-02-09 ENCOUNTER — Other Ambulatory Visit (INDEPENDENT_AMBULATORY_CARE_PROVIDER_SITE_OTHER): Payer: Self-pay | Admitting: Nurse Practitioner

## 2018-02-09 DIAGNOSIS — Z87891 Personal history of nicotine dependence: Secondary | ICD-10-CM | POA: Diagnosis not present

## 2018-02-09 DIAGNOSIS — I132 Hypertensive heart and chronic kidney disease with heart failure and with stage 5 chronic kidney disease, or end stage renal disease: Secondary | ICD-10-CM | POA: Insufficient documentation

## 2018-02-09 DIAGNOSIS — Z951 Presence of aortocoronary bypass graft: Secondary | ICD-10-CM | POA: Diagnosis not present

## 2018-02-09 DIAGNOSIS — I252 Old myocardial infarction: Secondary | ICD-10-CM | POA: Diagnosis not present

## 2018-02-09 DIAGNOSIS — N189 Chronic kidney disease, unspecified: Secondary | ICD-10-CM | POA: Diagnosis not present

## 2018-02-09 DIAGNOSIS — I5030 Unspecified diastolic (congestive) heart failure: Secondary | ICD-10-CM | POA: Diagnosis not present

## 2018-02-09 DIAGNOSIS — E119 Type 2 diabetes mellitus without complications: Secondary | ICD-10-CM | POA: Diagnosis not present

## 2018-02-09 DIAGNOSIS — Z9049 Acquired absence of other specified parts of digestive tract: Secondary | ICD-10-CM | POA: Insufficient documentation

## 2018-02-09 DIAGNOSIS — Z9841 Cataract extraction status, right eye: Secondary | ICD-10-CM | POA: Diagnosis not present

## 2018-02-09 DIAGNOSIS — N179 Acute kidney failure, unspecified: Secondary | ICD-10-CM | POA: Diagnosis not present

## 2018-02-09 DIAGNOSIS — I110 Hypertensive heart disease with heart failure: Secondary | ICD-10-CM | POA: Diagnosis not present

## 2018-02-09 DIAGNOSIS — M170 Bilateral primary osteoarthritis of knee: Secondary | ICD-10-CM | POA: Diagnosis not present

## 2018-02-09 DIAGNOSIS — R0609 Other forms of dyspnea: Secondary | ICD-10-CM | POA: Diagnosis not present

## 2018-02-09 DIAGNOSIS — Z9889 Other specified postprocedural states: Secondary | ICD-10-CM | POA: Insufficient documentation

## 2018-02-09 DIAGNOSIS — Z8249 Family history of ischemic heart disease and other diseases of the circulatory system: Secondary | ICD-10-CM | POA: Insufficient documentation

## 2018-02-09 DIAGNOSIS — I509 Heart failure, unspecified: Secondary | ICD-10-CM | POA: Diagnosis not present

## 2018-02-09 DIAGNOSIS — S41112D Laceration without foreign body of left upper arm, subsequent encounter: Secondary | ICD-10-CM | POA: Diagnosis not present

## 2018-02-09 DIAGNOSIS — Z9842 Cataract extraction status, left eye: Secondary | ICD-10-CM | POA: Insufficient documentation

## 2018-02-09 DIAGNOSIS — E1122 Type 2 diabetes mellitus with diabetic chronic kidney disease: Secondary | ICD-10-CM | POA: Insufficient documentation

## 2018-02-09 DIAGNOSIS — Z880 Allergy status to penicillin: Secondary | ICD-10-CM | POA: Diagnosis not present

## 2018-02-09 DIAGNOSIS — I10 Essential (primary) hypertension: Secondary | ICD-10-CM | POA: Diagnosis not present

## 2018-02-09 DIAGNOSIS — Z955 Presence of coronary angioplasty implant and graft: Secondary | ICD-10-CM | POA: Diagnosis not present

## 2018-02-09 DIAGNOSIS — I2510 Atherosclerotic heart disease of native coronary artery without angina pectoris: Secondary | ICD-10-CM | POA: Insufficient documentation

## 2018-02-09 DIAGNOSIS — N186 End stage renal disease: Secondary | ICD-10-CM | POA: Diagnosis not present

## 2018-02-09 DIAGNOSIS — Z452 Encounter for adjustment and management of vascular access device: Secondary | ICD-10-CM | POA: Insufficient documentation

## 2018-02-09 DIAGNOSIS — R600 Localized edema: Secondary | ICD-10-CM | POA: Diagnosis not present

## 2018-02-09 DIAGNOSIS — M1990 Unspecified osteoarthritis, unspecified site: Secondary | ICD-10-CM | POA: Diagnosis not present

## 2018-02-09 HISTORY — PX: DIALYSIS/PERMA CATHETER INSERTION: CATH118288

## 2018-02-09 LAB — GLUCOSE, CAPILLARY: Glucose-Capillary: 140 mg/dL — ABNORMAL HIGH (ref 70–99)

## 2018-02-09 LAB — POTASSIUM (ARMC VASCULAR LAB ONLY): Potassium (ARMC vascular lab): 4.2 (ref 3.5–5.1)

## 2018-02-09 SURGERY — DIALYSIS/PERMA CATHETER INSERTION
Anesthesia: Moderate Sedation

## 2018-02-09 MED ORDER — MIDAZOLAM HCL 2 MG/2ML IJ SOLN
INTRAMUSCULAR | Status: AC
  Filled 2018-02-09: qty 2

## 2018-02-09 MED ORDER — SODIUM CHLORIDE 0.9 % IV SOLN: INTRAVENOUS | Status: DC

## 2018-02-09 MED ORDER — LIDOCAINE-EPINEPHRINE (PF) 1 %-1:200000 IJ SOLN
INTRAMUSCULAR | Status: AC
  Filled 2018-02-09: qty 30

## 2018-02-09 MED ORDER — ONDANSETRON HCL 4 MG/2ML IJ SOLN: 4.0000 mg | Freq: Four times a day (QID) | INTRAMUSCULAR | Status: DC | PRN

## 2018-02-09 MED ORDER — METHYLPREDNISOLONE SODIUM SUCC 125 MG IJ SOLR: 125.0000 mg | INTRAMUSCULAR | Status: DC | PRN

## 2018-02-09 MED ORDER — HYDROMORPHONE HCL 1 MG/ML IJ SOLN: 1.0000 mg | Freq: Once | INTRAMUSCULAR | Status: DC | PRN

## 2018-02-09 MED ORDER — HEPARIN (PORCINE) IN NACL 1000-0.9 UT/500ML-% IV SOLN
INTRAVENOUS | Status: AC
  Filled 2018-02-09: qty 500

## 2018-02-09 MED ORDER — HEPARIN SODIUM (PORCINE) 10000 UNIT/ML IJ SOLN
INTRAMUSCULAR | Status: AC
  Filled 2018-02-09: qty 1

## 2018-02-09 MED ORDER — FENTANYL CITRATE (PF) 100 MCG/2ML IJ SOLN
INTRAMUSCULAR | Status: AC
  Filled 2018-02-09: qty 2

## 2018-02-09 MED ORDER — FAMOTIDINE 20 MG PO TABS: 40.0000 mg | ORAL_TABLET | ORAL | Status: DC | PRN

## 2018-02-09 MED ORDER — CLINDAMYCIN PHOSPHATE 300 MG/50ML IV SOLN
300.0000 mg | Freq: Once | INTRAVENOUS | Status: AC
  Administered 2018-02-09: 300 mg via INTRAVENOUS

## 2018-02-09 MED ORDER — MIDAZOLAM HCL 2 MG/2ML IJ SOLN
INTRAMUSCULAR | Status: DC | PRN
  Administered 2018-02-09: 1 mg via INTRAVENOUS

## 2018-02-09 MED ORDER — CLINDAMYCIN PHOSPHATE 300 MG/50ML IV SOLN
INTRAVENOUS | Status: AC
  Administered 2018-02-09: 300 mg via INTRAVENOUS
  Filled 2018-02-09: qty 50

## 2018-02-09 MED ORDER — FENTANYL CITRATE (PF) 100 MCG/2ML IJ SOLN
INTRAMUSCULAR | Status: DC | PRN
  Administered 2018-02-09: 25 ug via INTRAVENOUS

## 2018-02-09 SURGICAL SUPPLY — 6 items
CATH PALINDROME RT-P 15FX19CM (CATHETERS) ×3 IMPLANT
DERMABOND ADVANCED (GAUZE/BANDAGES/DRESSINGS) ×2
DERMABOND ADVANCED .7 DNX12 (GAUZE/BANDAGES/DRESSINGS) ×1 IMPLANT
PACK ANGIOGRAPHY (CUSTOM PROCEDURE TRAY) ×3 IMPLANT
SUT MNCRL AB 4-0 PS2 18 (SUTURE) ×3 IMPLANT
SUT PROLENE 0 CT 1 30 (SUTURE) ×3 IMPLANT

## 2018-02-09 NOTE — H&P (Signed)
[facility] Vascular Consult Note  MRN : [id number]  Matthew [name] is a 84 y.o. (July 18, 1930) male who presents with chief complaint of No chief complaint on file. Marland Kitchen  History of Present Illness: I am asked to see the patient by Dr. Holley Raring for placement of a dialysis catheter.  The patient has long-standing chronic kidney disease and has had deterioration of his renal function over the past several weeks to months.  He has shortness of breath with minimal exertion.  He is having edema and swelling of his legs.  His renal function has continued to decline, and his nephrologist now feels he is end-stage renal disease.  As such, he will need dialysis access.  This needs to be done ASAP and I am contacted by his nephrologist while the patient was an outpatient to see if this could be done today.  Patient arrives for his PermCath placement.  No current facility-administered medications for this encounter.     Past Medical History:  Diagnosis Date  . Anemia   . Arthritis   . Broken arm 05/2013   Left  . CHF (congestive heart failure) ([place])   . Chronic kidney disease   . Coronary artery disease   . Diabetes mellitus without complication ([place])   . Dyspnea    DOE  . Edema    FEET/LEGS OCCAS  . Hypertension   . Myocardial infarction [facility])     Past Surgical History:  Procedure Laterality Date  . CATARACT EXTRACTION W/PHACO Left 07/26/2017   Procedure: CATARACT EXTRACTION PHACO AND INTRAOCULAR LENS PLACEMENT (IOC);  Surgeon: Birder Robson, MD;  Location: [facility] ORS;  Service: Ophthalmology;  Laterality: Left;  [place]   00:45.8 AP%  13.1 CDE  6.00 Fluid Pack Lot # [id number]  . CATARACT EXTRACTION W/PHACO Right 08/17/2017   Procedure: CATARACT EXTRACTION PHACO AND INTRAOCULAR LENS PLACEMENT (IOC);  Surgeon: Birder Robson, MD;  Location: [facility] ORS;  Service: Ophthalmology;  Laterality: Right;  [place]  01:30 AP% 16.8 CDE 15.24 Fluid pack lot # [id number]  . CHOLECYSTECTOMY     . CORONARY ANGIOPLASTY     STENTS  . CORONARY ARTERY BYPASS GRAFT    . FRACTURE SURGERY     ARM  . LEFT HEART CATHETERIZATION WITH CORONARY/GRAFT ANGIOGRAM Left 12/04/2013   Procedure: LEFT HEART CATHETERIZATION WITH Beatrix Fetters;  Surgeon: Leonie Man, MD;  Location: [facility] CATH LAB;  Service: Cardiovascular;  Laterality: Left;  . PERCUTANEOUS CORONARY STENT INTERVENTION (PCI-S) N/A 12/06/2013   Procedure: PERCUTANEOUS CORONARY STENT INTERVENTION (PCI-S);  Surgeon: Sinclair Grooms, MD;  Location: [facility] CATH LAB;  Service: Cardiovascular;  Laterality: N/A;  . VEIN BYPASS SURGERY     [place] Bypass Surgery     Social History Social History   Tobacco Use  . Smoking status: Former Smoker    Types: Cigarettes  . Smokeless tobacco: Never Used  . Tobacco comment: quit 30 yrs ago, smoked 14-15 yrs, mainly pipe  Substance Use Topics  . Alcohol use: Yes    Alcohol/week: 1.0 standard drinks    Types: 1 Glasses of wine per week    Comment: occasionally drinking only  . Drug use: No    Family History Family History  Problem Relation Age of Onset  . Heart attack Father 35       died first MI at age 18  . Heart failure Mother   No bleeding disorders, clotting disorders, or porphyria  Allergies  Allergen Reactions  . Amoxicillin Diarrhea  REVIEW OF SYSTEMS (Negative unless checked)  Constitutional: [] Weight loss  [] Fever  [] Chills Cardiac: [] Chest pain   [] Chest pressure   [x] Palpitations   [] Shortness of breath when laying flat   [x] Shortness of breath at rest   [x] Shortness of breath with exertion. Vascular:  [] Pain in legs with walking   [] Pain in legs at rest   [] Pain in legs when laying flat   [] Claudication   [] Pain in feet when walking  [] Pain in feet at rest  [] Pain in feet when laying flat   [] History of DVT   [] Phlebitis   [x] Swelling in legs   [] Varicose veins   [] Non-healing ulcers Pulmonary:   [] Uses home oxygen   [x] Productive cough   [] Hemoptysis    [] Wheeze  [] COPD   [] Asthma Neurologic:  [] Dizziness  [] Blackouts   [] Seizures   [] History of stroke   [] History of TIA  [] Aphasia   [] Temporary blindness   [] Dysphagia   [] Weakness or numbness in arms   [] Weakness or numbness in legs Musculoskeletal:  [x] Arthritis   [] Joint swelling   [] Joint pain   [] Low back pain Hematologic:  [] Easy bruising  [] Easy bleeding   [] Hypercoagulable state   [x] Anemic  [] Hepatitis Gastrointestinal:  [] Blood in stool   [] Vomiting blood  [] Gastroesophageal reflux/heartburn   [] Difficulty swallowing. Genitourinary:  [x] Chronic kidney disease   [] Difficult urination  [] Frequent urination  [] Burning with urination   [] Blood in urine Skin:  [] Rashes   [] Ulcers   [] Wounds Psychological:  [] History of anxiety   []  History of major depression.  Physical Examination  There were no vitals filed for this visit. There is no height or weight on file to calculate BMI. Gen:  WD/WN, NAD Head: [place]/AT, No temporalis wasting. Ear/Nose/Throat: Hearing grossly intact, nares w/o erythema or drainage, oropharynx w/o Erythema/Exudate Eyes: Sclera non-icteric, conjunctiva clear Neck: Trachea midline.  No JVD.  Pulmonary:  Good air movement, respirations not labored, equal bilaterally.  Cardiac: RRR, normal S1, S2. Vascular:  Vessel Right Left  Radial Palpable Palpable                                    Musculoskeletal: M/S 5/5 throughout.  Extremities without ischemic changes.  No deformity or atrophy. Mild to moderate LE edema. Neurologic: Sensation grossly intact in extremities.  Symmetrical.  Speech is fluent. Motor exam as listed above. Psychiatric: Judgment intact, Mood & affect appropriate for pt's clinical situation. Dermatologic: No rashes or ulcers noted.  No cellulitis or open wounds.       CBC Lab Results  Component Value Date   WBC 5.4 01/20/2018   HGB 11.0 (L) 01/20/2018   HCT 32.3 (L) 01/20/2018   MCV 88.0 01/20/2018   PLT 127 (L) 01/20/2018     BMET    Component Value Date/Time   NA 139 01/19/2018 0323   K 4.5 01/19/2018 0323   CL 101 01/19/2018 0323   CO2 28 01/19/2018 0323   GLUCOSE 78 01/19/2018 0323   BUN 88 (H) 01/19/2018 0323   CREATININE 3.75 (H) 01/19/2018 0323   CALCIUM 9.1 01/19/2018 0323   GFRNONAA 13 (L) 01/19/2018 0323   GFRAA 16 (L) 01/19/2018 0323   CrCl cannot be calculated (Patient's most recent lab result is older than the maximum 21 days allowed.).  COAG Lab Results  Component Value Date   INR 1.10 12/06/2013   INR 1.12 12/04/2013    Radiology Dg Chest 2  View  Result Date: 01/10/2018 CLINICAL DATA:  Weight gain. EXAM: CHEST - 2 VIEW COMPARISON:  09/20/2017. FINDINGS: Prior CABG. Pulmonary venous congestion bilateral interstitial prominence consistent CHF. Small left pleural effusion. No pneumothorax. IMPRESSION: Prior CABG. Cardiomegaly with bilateral interstitial prominence noted consistent CHF. Small left pleural effusion. Electronically Signed   By: Marcello Moores  Register   On: 01/10/2018 15:05   Ct Head Wo Contrast  Result Date: 01/18/2018 CLINICAL DATA:  84 year old fall while with at home an unwitnessed, found face down on the floor. Initial encounter. EXAM: CT HEAD WITHOUT CONTRAST CT CERVICAL SPINE WITHOUT CONTRAST TECHNIQUE: Multidetector CT imaging of the head and cervical spine was performed following the standard protocol without intravenous contrast. Multiplanar CT image reconstructions of the cervical spine were also generated. COMPARISON:  None. FINDINGS: CT HEAD FINDINGS Brain: Severe age related cortical atrophy, moderate deep atrophy and mild cerebellar atrophy. Arachnoid cyst involving the RIGHT MIDDLE cranial fossa. Mild to moderate changes of small vessel disease of the white matter. No mass lesion. No midline shift. No acute hemorrhage or hematoma. No extra-axial fluid collections. No evidence of acute infarction. Vascular: Moderate BILATERAL carotid siphon and BILATERAL vertebral  artery atherosclerosis. Extensive intracranial atherosclerosis. No hyperdense vessel. Skull: No skull fracture or other focal osseous abnormality involving the skull. Sinuses/Orbits: Mucous retention cyst or polyp in the LEFT maxillary sinus and in a LEFT POSTERIOR ethmoid air cell. Remaining visualized paranasal sinuses, BILATERAL mastoid air cells and BILATERAL middle ear cavities well-aerated. Visualized orbits and globes intact. Other: None. CT CERVICAL SPINE FINDINGS Alignment: Anatomic POSTERIOR alignment. Straightening of the usual cervical lordosis. Skull base and vertebrae: No fractures identified involving the cervical spine. Facet joints intact with degenerative changes throughout. Coronal reformatted images demonstrate an intact craniocervical junction, intact dens and intact lateral masses throughout. Soft tissues and spinal canal: No paraspinal or canal hematoma. Moderate spinal stenosis at C4-5 and C5-6 and mild spinal stenosis at C6-7. Disc levels: Acquired fusion of the C2 and C3 vertebral bodies with congenital fusion of the vertebral bodies and POSTERIOR elements of C3 and C4. 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 Disc space narrowing and endplate hypertrophic changes at every level from C4-5 through C7-T1. Facet and uncinate hypertrophy account for multilevel foraminal stenoses including severe RIGHT and mild LEFT C2-3, severe LEFT and moderate RIGHT C4-5, severe BILATERAL C6-7. Upper chest: Visualized lung apices clear. Visualized superior mediastinum normal. Other: None. IMPRESSION: CT Head: 1. No acute intracranial abnormality. 2. Severe age related cortical atrophy, moderate deep atrophy and mild cerebellar atrophy. Mild to moderate chronic microvascular ischemic changes of the white matter. 3. Benign arachnoid cyst involving the RIGHT MIDDLE cranial fossa. CT Cervical Spine: 1. No cervical spine  fractures identified. 2. Multilevel degenerative disc disease, spondylosis and facet degenerative changes with multilevel foraminal stenoses as detailed above. 3. Moderate multifactorial spinal stenosis at C4-5 and C5-6 and mild multifactorial spinal stenosis at C6-7. Electronically Signed   By: Evangeline Dakin M.D.   On: 01/18/2018 19:05   Ct Cervical Spine Wo Contrast  Result Date: 01/18/2018 CLINICAL DATA:  84 year old fall while with at home an unwitnessed, found face down on the floor. Initial encounter. EXAM: CT HEAD WITHOUT CONTRAST CT CERVICAL SPINE WITHOUT CONTRAST TECHNIQUE: Multidetector CT imaging of the head and cervical spine was performed following the standard protocol without intravenous contrast. Multiplanar CT image reconstructions of the cervical spine were also generated. COMPARISON:  None. FINDINGS: CT  HEAD FINDINGS Brain: Severe age related cortical atrophy, moderate deep atrophy and mild cerebellar atrophy. Arachnoid cyst involving the RIGHT MIDDLE cranial fossa. Mild to moderate changes of small vessel disease of the white matter. No mass lesion. No midline shift. No acute hemorrhage or hematoma. No extra-axial fluid collections. No evidence of acute infarction. Vascular: Moderate BILATERAL carotid siphon and BILATERAL vertebral artery atherosclerosis. Extensive intracranial atherosclerosis. No hyperdense vessel. Skull: No skull fracture or other focal osseous abnormality involving the skull. Sinuses/Orbits: Mucous retention cyst or polyp in the LEFT maxillary sinus and in a LEFT POSTERIOR ethmoid air cell. Remaining visualized paranasal sinuses, BILATERAL mastoid air cells and BILATERAL middle ear cavities well-aerated. Visualized orbits and globes intact. Other: None. CT CERVICAL SPINE FINDINGS Alignment: Anatomic POSTERIOR alignment. Straightening of the usual cervical lordosis. Skull base and vertebrae: No fractures identified involving the cervical spine. Facet joints intact with  degenerative changes throughout. Coronal reformatted images demonstrate an intact craniocervical junction, intact dens and intact lateral masses throughout. Soft tissues and spinal canal: No paraspinal or canal hematoma. Moderate spinal stenosis at C4-5 and C5-6 and mild spinal stenosis at C6-7. Disc levels: Acquired fusion of the C2 and C3 vertebral bodies with congenital fusion of the vertebral bodies and POSTERIOR elements of C3 and C4. 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 Disc space narrowing and endplate hypertrophic changes at every level from C4-5 through C7-T1. Facet and uncinate hypertrophy account for multilevel foraminal stenoses including severe RIGHT and mild LEFT C2-3, severe LEFT and moderate RIGHT C4-5, severe BILATERAL C6-7. Upper chest: Visualized lung apices clear. Visualized superior mediastinum normal. Other: None. IMPRESSION: CT Head: 1. No acute intracranial abnormality. 2. Severe age related cortical atrophy, moderate deep atrophy and mild cerebellar atrophy. Mild to moderate chronic microvascular ischemic changes of the white matter. 3. Benign arachnoid cyst involving the RIGHT MIDDLE cranial fossa. CT Cervical Spine: 1. No cervical spine fractures identified. 2. Multilevel degenerative disc disease, spondylosis and facet degenerative changes with multilevel foraminal stenoses as detailed above. 3. Moderate multifactorial spinal stenosis at C4-5 and C5-6 and mild multifactorial spinal stenosis at C6-7. Electronically Signed   By: Evangeline Dakin M.D.   On: 01/18/2018 19:05      Assessment/Plan 1.  Renal failure.  Now felt to be end-stage renal disease.  I have been asked by nephrology to place the [facility] ASAP.  This will be done today.  It can be used immediately.  The patient should then follow-up for outpatient evaluation for AV fistula creation with vein mapping 2.  Hypertension.   Likely an underlying cause of his renal failure and blood pressure control important in reducing the progression of atherosclerotic disease. On appropriate oral medications. 3.  Diabetes.  Likely an underlying cause of his renal failure and blood glucose control important in reducing the progression of atherosclerotic disease. Also, involved in wound healing. On appropriate medications. 4.  CAD.  Nitrates as needed for chest pain.  Monitor telemetry and oxygen saturations throughout the procedure   [name], MD  02/09/2018 12:09 PM    This note was created with Dragon medical transcription system.  Any error is purely unintentional

## 2018-02-09 NOTE — Op Note (Signed)
OPERATIVE NOTE    PRE-OPERATIVE DIAGNOSIS: 1. ESRD   POST-OPERATIVE DIAGNOSIS: same as above  PROCEDURE: 1. Ultrasound guidance for vascular access to the right internal jugular vein 2. Fluoroscopic guidance for placement of catheter 3. Placement of a 19 cm tip to cuff tunneled hemodialysis catheter via the right internal jugular vein  SURGEON: [name], MD  ANESTHESIA:  Local with Moderate conscious sedation for approximately 15 minutes using 1 mg of Versed and 25 mcg of Fentanyl  ESTIMATED BLOOD LOSS: 5 cc  FLUORO TIME: less than one minute  CONTRAST: none  FINDING(S): 1.  Patent right internal jugular vein  SPECIMEN(S):  None  INDICATIONS:   Matthew [name] is a 84 y.o.male who presents with renal failure and need for HD access.  The patient needs long term dialysis access for their ESRD, and a Permcath is necessary.  Risks and benefits are discussed and informed consent is obtained.    DESCRIPTION: After obtaining full informed written consent, the patient was brought back to the vascular suited. The patient's right neck and chest were sterilely prepped and draped in a sterile surgical field was created. Moderate conscious sedation was administered during a face to face encounter with the patient throughout the procedure with my supervision of the RN administering medicines and monitoring the patient's vital signs, pulse oximetry, telemetry and mental status throughout from the start of the procedure until the patient was taken to the recovery room.  The right internal jugular vein was visualized with ultrasound and found to be patent. It was then accessed under direct ultrasound guidance and a permanent image was recorded. A wire was placed. After skin nick and dilatation, the peel-away sheath was placed over the wire. I then turned my attention to an area under the clavicle. Approximately 1-2 fingerbreadths below the clavicle a small counterincision was created and tunneled  from the subclavicular incision to the access site. Using fluoroscopic guidance, a 19 centimeter tip to cuff tunneled hemodialysis catheter was selected, and tunneled from the subclavicular incision to the access site. It was then placed through the peel-away sheath and the peel-away sheath was removed. Using fluoroscopic guidance the catheter tips were parked in the right atrium. The appropriate distal connectors were placed. It withdrew blood well and flushed easily with heparinized saline and a concentrated heparin solution was then placed. It was secured to the chest wall with 2 Prolene sutures. The access incision was closed single 4-0 Monocryl. A 4-0 Monocryl pursestring suture was placed around the exit site. Sterile dressings were placed. The patient tolerated the procedure well and was taken to the recovery room in stable condition.  COMPLICATIONS: None  CONDITION: Stable  [name], MD 02/09/2018 1:05 PM   This note was created with Dragon Medical transcription system. Any errors in dictation are purely unintentional.

## 2018-02-10 ENCOUNTER — Encounter: Payer: Self-pay | Admitting: Vascular Surgery

## 2018-02-10 DIAGNOSIS — D509 Iron deficiency anemia, unspecified: Secondary | ICD-10-CM | POA: Diagnosis not present

## 2018-02-10 DIAGNOSIS — Z992 Dependence on renal dialysis: Secondary | ICD-10-CM | POA: Diagnosis not present

## 2018-02-10 DIAGNOSIS — E119 Type 2 diabetes mellitus without complications: Secondary | ICD-10-CM | POA: Diagnosis not present

## 2018-02-10 DIAGNOSIS — N2581 Secondary hyperparathyroidism of renal origin: Secondary | ICD-10-CM | POA: Diagnosis not present

## 2018-02-10 DIAGNOSIS — N186 End stage renal disease: Secondary | ICD-10-CM | POA: Diagnosis not present

## 2018-02-11 DIAGNOSIS — D509 Iron deficiency anemia, unspecified: Secondary | ICD-10-CM | POA: Diagnosis not present

## 2018-02-11 DIAGNOSIS — Z992 Dependence on renal dialysis: Secondary | ICD-10-CM | POA: Diagnosis not present

## 2018-02-11 DIAGNOSIS — N186 End stage renal disease: Secondary | ICD-10-CM | POA: Diagnosis not present

## 2018-02-11 DIAGNOSIS — N2581 Secondary hyperparathyroidism of renal origin: Secondary | ICD-10-CM | POA: Diagnosis not present

## 2018-02-14 DIAGNOSIS — I2510 Atherosclerotic heart disease of native coronary artery without angina pectoris: Secondary | ICD-10-CM | POA: Diagnosis not present

## 2018-02-14 DIAGNOSIS — Z992 Dependence on renal dialysis: Secondary | ICD-10-CM | POA: Diagnosis not present

## 2018-02-14 DIAGNOSIS — S41112D Laceration without foreign body of left upper arm, subsequent encounter: Secondary | ICD-10-CM | POA: Diagnosis not present

## 2018-02-14 DIAGNOSIS — N186 End stage renal disease: Secondary | ICD-10-CM | POA: Diagnosis not present

## 2018-02-14 DIAGNOSIS — N2581 Secondary hyperparathyroidism of renal origin: Secondary | ICD-10-CM | POA: Diagnosis not present

## 2018-02-14 DIAGNOSIS — D509 Iron deficiency anemia, unspecified: Secondary | ICD-10-CM | POA: Diagnosis not present

## 2018-02-14 DIAGNOSIS — I110 Hypertensive heart disease with heart failure: Secondary | ICD-10-CM | POA: Diagnosis not present

## 2018-02-14 DIAGNOSIS — I5030 Unspecified diastolic (congestive) heart failure: Secondary | ICD-10-CM | POA: Diagnosis not present

## 2018-02-14 DIAGNOSIS — M170 Bilateral primary osteoarthritis of knee: Secondary | ICD-10-CM | POA: Diagnosis not present

## 2018-02-14 DIAGNOSIS — E119 Type 2 diabetes mellitus without complications: Secondary | ICD-10-CM | POA: Diagnosis not present

## 2018-02-15 ENCOUNTER — Inpatient Hospital Stay
Admission: EM | Admit: 2018-02-15 | Discharge: 2018-02-17 | DRG: 205 | Disposition: A | Discharge status: Home Health | Payer: Medicare Other | Attending: Internal Medicine | Admitting: Internal Medicine

## 2018-02-15 ENCOUNTER — Emergency Department: Payer: Medicare Other

## 2018-02-15 ENCOUNTER — Other Ambulatory Visit: Payer: Self-pay

## 2018-02-15 ENCOUNTER — Encounter: Payer: Self-pay | Admitting: Emergency Medicine

## 2018-02-15 DIAGNOSIS — Z951 Presence of aortocoronary bypass graft: Secondary | ICD-10-CM

## 2018-02-15 DIAGNOSIS — Z8249 Family history of ischemic heart disease and other diseases of the circulatory system: Secondary | ICD-10-CM

## 2018-02-15 DIAGNOSIS — I4891 Unspecified atrial fibrillation: Secondary | ICD-10-CM | POA: Diagnosis present

## 2018-02-15 DIAGNOSIS — E1122 Type 2 diabetes mellitus with diabetic chronic kidney disease: Secondary | ICD-10-CM | POA: Diagnosis present

## 2018-02-15 DIAGNOSIS — Z961 Presence of intraocular lens: Secondary | ICD-10-CM | POA: Diagnosis present

## 2018-02-15 DIAGNOSIS — S50811D Abrasion of right forearm, subsequent encounter: Secondary | ICD-10-CM | POA: Diagnosis not present

## 2018-02-15 DIAGNOSIS — Z7982 Long term (current) use of aspirin: Secondary | ICD-10-CM

## 2018-02-15 DIAGNOSIS — Z7951 Long term (current) use of inhaled steroids: Secondary | ICD-10-CM

## 2018-02-15 DIAGNOSIS — I952 Hypotension due to drugs: Secondary | ICD-10-CM | POA: Diagnosis present

## 2018-02-15 DIAGNOSIS — I132 Hypertensive heart and chronic kidney disease with heart failure and with stage 5 chronic kidney disease, or end stage renal disease: Secondary | ICD-10-CM | POA: Diagnosis present

## 2018-02-15 DIAGNOSIS — D631 Anemia in chronic kidney disease: Secondary | ICD-10-CM | POA: Diagnosis present

## 2018-02-15 DIAGNOSIS — Z7902 Long term (current) use of antithrombotics/antiplatelets: Secondary | ICD-10-CM | POA: Diagnosis not present

## 2018-02-15 DIAGNOSIS — R0902 Hypoxemia: Secondary | ICD-10-CM | POA: Diagnosis present

## 2018-02-15 DIAGNOSIS — A419 Sepsis, unspecified organism: Secondary | ICD-10-CM | POA: Diagnosis not present

## 2018-02-15 DIAGNOSIS — I2510 Atherosclerotic heart disease of native coronary artery without angina pectoris: Secondary | ICD-10-CM | POA: Diagnosis present

## 2018-02-15 DIAGNOSIS — S60413D Abrasion of left middle finger, subsequent encounter: Secondary | ICD-10-CM | POA: Diagnosis not present

## 2018-02-15 DIAGNOSIS — E860 Dehydration: Secondary | ICD-10-CM | POA: Diagnosis present

## 2018-02-15 DIAGNOSIS — S71112D Laceration without foreign body, left thigh, subsequent encounter: Secondary | ICD-10-CM | POA: Diagnosis not present

## 2018-02-15 DIAGNOSIS — I252 Old myocardial infarction: Secondary | ICD-10-CM | POA: Diagnosis not present

## 2018-02-15 DIAGNOSIS — Z9049 Acquired absence of other specified parts of digestive tract: Secondary | ICD-10-CM

## 2018-02-15 DIAGNOSIS — N185 Chronic kidney disease, stage 5: Secondary | ICD-10-CM | POA: Diagnosis not present

## 2018-02-15 DIAGNOSIS — M170 Bilateral primary osteoarthritis of knee: Secondary | ICD-10-CM | POA: Diagnosis not present

## 2018-02-15 DIAGNOSIS — Z79899 Other long term (current) drug therapy: Secondary | ICD-10-CM | POA: Diagnosis not present

## 2018-02-15 DIAGNOSIS — Z955 Presence of coronary angioplasty implant and graft: Secondary | ICD-10-CM

## 2018-02-15 DIAGNOSIS — N184 Chronic kidney disease, stage 4 (severe): Secondary | ICD-10-CM | POA: Diagnosis not present

## 2018-02-15 DIAGNOSIS — R42 Dizziness and giddiness: Secondary | ICD-10-CM | POA: Diagnosis not present

## 2018-02-15 DIAGNOSIS — Z66 Do not resuscitate: Secondary | ICD-10-CM | POA: Diagnosis present

## 2018-02-15 DIAGNOSIS — J189 Pneumonia, unspecified organism: Secondary | ICD-10-CM | POA: Diagnosis not present

## 2018-02-15 DIAGNOSIS — Z7984 Long term (current) use of oral hypoglycemic drugs: Secondary | ICD-10-CM | POA: Diagnosis not present

## 2018-02-15 DIAGNOSIS — Z9841 Cataract extraction status, right eye: Secondary | ICD-10-CM | POA: Diagnosis not present

## 2018-02-15 DIAGNOSIS — Z992 Dependence on renal dialysis: Secondary | ICD-10-CM | POA: Diagnosis not present

## 2018-02-15 DIAGNOSIS — Z9842 Cataract extraction status, left eye: Secondary | ICD-10-CM

## 2018-02-15 DIAGNOSIS — S1091XD Abrasion of unspecified part of neck, subsequent encounter: Secondary | ICD-10-CM | POA: Diagnosis not present

## 2018-02-15 DIAGNOSIS — I130 Hypertensive heart and chronic kidney disease with heart failure and stage 1 through stage 4 chronic kidney disease, or unspecified chronic kidney disease: Secondary | ICD-10-CM | POA: Diagnosis not present

## 2018-02-15 DIAGNOSIS — I351 Nonrheumatic aortic (valve) insufficiency: Secondary | ICD-10-CM | POA: Diagnosis not present

## 2018-02-15 DIAGNOSIS — M1990 Unspecified osteoarthritis, unspecified site: Secondary | ICD-10-CM | POA: Diagnosis present

## 2018-02-15 DIAGNOSIS — J9811 Atelectasis: Principal | ICD-10-CM | POA: Diagnosis present

## 2018-02-15 DIAGNOSIS — I5023 Acute on chronic systolic (congestive) heart failure: Secondary | ICD-10-CM | POA: Diagnosis present

## 2018-02-15 DIAGNOSIS — N2581 Secondary hyperparathyroidism of renal origin: Secondary | ICD-10-CM | POA: Diagnosis present

## 2018-02-15 DIAGNOSIS — Z87891 Personal history of nicotine dependence: Secondary | ICD-10-CM

## 2018-02-15 DIAGNOSIS — N186 End stage renal disease: Secondary | ICD-10-CM | POA: Diagnosis present

## 2018-02-15 DIAGNOSIS — I447 Left bundle-branch block, unspecified: Secondary | ICD-10-CM | POA: Diagnosis present

## 2018-02-15 DIAGNOSIS — E785 Hyperlipidemia, unspecified: Secondary | ICD-10-CM | POA: Diagnosis present

## 2018-02-15 DIAGNOSIS — I712 Thoracic aortic aneurysm, without rupture: Secondary | ICD-10-CM | POA: Diagnosis not present

## 2018-02-15 DIAGNOSIS — I959 Hypotension, unspecified: Secondary | ICD-10-CM | POA: Diagnosis not present

## 2018-02-15 DIAGNOSIS — I340 Nonrheumatic mitral (valve) insufficiency: Secondary | ICD-10-CM | POA: Diagnosis not present

## 2018-02-15 DIAGNOSIS — I10 Essential (primary) hypertension: Secondary | ICD-10-CM | POA: Diagnosis not present

## 2018-02-15 DIAGNOSIS — S81012D Laceration without foreign body, left knee, subsequent encounter: Secondary | ICD-10-CM | POA: Diagnosis not present

## 2018-02-15 DIAGNOSIS — Z881 Allergy status to other antibiotic agents status: Secondary | ICD-10-CM

## 2018-02-15 DIAGNOSIS — E669 Obesity, unspecified: Secondary | ICD-10-CM | POA: Diagnosis not present

## 2018-02-15 LAB — CBC WITH DIFFERENTIAL/PLATELET
BASOS ABS: 0.1 10*3/uL (ref 0–0.1)
BASOS PCT: 1 %
EOS ABS: 0.2 10*3/uL (ref 0–0.7)
Eosinophils Relative: 3 %
HCT: 34.3 % — ABNORMAL LOW (ref 40.0–52.0)
Hemoglobin: 11.9 g/dL — ABNORMAL LOW (ref 13.0–18.0)
LYMPHS ABS: 0.7 10*3/uL — AB (ref 1.0–3.6)
Lymphocytes Relative: 12 %
MCH: 31 pg (ref 26.0–34.0)
MCHC: 34.6 g/dL (ref 32.0–36.0)
MCV: 89.5 fL (ref 80.0–100.0)
Monocytes Absolute: 0.8 10*3/uL (ref 0.2–1.0)
Monocytes Relative: 14 %
NEUTROS PCT: 70 %
Neutro Abs: 3.8 10*3/uL (ref 1.4–6.5)
PLATELETS: 129 10*3/uL — AB (ref 150–440)
RBC: 3.83 MIL/uL — AB (ref 4.40–5.90)
RDW: 17.9 % — ABNORMAL HIGH (ref 11.5–14.5)
WBC: 5.5 10*3/uL (ref 3.8–10.6)

## 2018-02-15 LAB — LACTIC ACID, PLASMA: Lactic Acid, Venous: 1.8 mmol/L (ref 0.5–1.9)

## 2018-02-15 LAB — COMPREHENSIVE METABOLIC PANEL
ALBUMIN: 3.6 g/dL (ref 3.5–5.0)
ALK PHOS: 42 U/L (ref 38–126)
ALT: 21 U/L (ref 0–44)
AST: 42 U/L — AB (ref 15–41)
Anion gap: 13 (ref 5–15)
BUN: 39 mg/dL — ABNORMAL HIGH (ref 8–23)
CALCIUM: 9.1 mg/dL (ref 8.9–10.3)
CHLORIDE: 98 mmol/L (ref 98–111)
CO2: 24 mmol/L (ref 22–32)
Creatinine, Ser: 3.39 mg/dL — ABNORMAL HIGH (ref 0.61–1.24)
GFR calc non Af Amer: 15 mL/min — ABNORMAL LOW (ref >60)
GFR, EST AFRICAN AMERICAN: 18 mL/min — AB (ref >60)
GLUCOSE: 149 mg/dL — AB (ref 70–99)
Potassium: 3.5 mmol/L (ref 3.5–5.1)
SODIUM: 135 mmol/L (ref 135–145)
Total Bilirubin: 1.4 mg/dL — ABNORMAL HIGH (ref 0.3–1.2)
Total Protein: 6.4 g/dL — ABNORMAL LOW (ref 6.5–8.1)

## 2018-02-15 LAB — TROPONIN I: Troponin I: <0.03 ng/mL (ref <0.03)

## 2018-02-15 LAB — MRSA PCR SCREENING: MRSA BY PCR: NEGATIVE

## 2018-02-15 LAB — GLUCOSE, CAPILLARY: GLUCOSE-CAPILLARY: 115 mg/dL — AB (ref 70–99)

## 2018-02-15 MED ORDER — ONDANSETRON HCL 4 MG PO TABS: 4.0000 mg | ORAL_TABLET | Freq: Four times a day (QID) | ORAL | Status: DC | PRN

## 2018-02-15 MED ORDER — ONDANSETRON HCL 4 MG/2ML IJ SOLN: 4.0000 mg | Freq: Four times a day (QID) | INTRAMUSCULAR | Status: DC | PRN

## 2018-02-15 MED ORDER — BUDESONIDE 0.25 MG/2ML IN SUSP
0.2500 mg | Freq: Two times a day (BID) | RESPIRATORY_TRACT | Status: DC
  Administered 2018-02-15 – 2018-02-17 (×4): 0.25 mg via RESPIRATORY_TRACT
  Filled 2018-02-15 (×4): qty 2

## 2018-02-15 MED ORDER — VANCOMYCIN HCL IN DEXTROSE 1-5 GM/200ML-% IV SOLN
1000.0000 mg | Freq: Once | INTRAVENOUS | Status: AC
  Administered 2018-02-15: 1000 mg via INTRAVENOUS
  Filled 2018-02-15: qty 200

## 2018-02-15 MED ORDER — ACETAMINOPHEN 325 MG PO TABS: 650.0000 mg | ORAL_TABLET | Freq: Four times a day (QID) | ORAL | Status: DC | PRN

## 2018-02-15 MED ORDER — SODIUM CHLORIDE 0.9% FLUSH: 3.0000 mL | INTRAVENOUS | Status: DC | PRN

## 2018-02-15 MED ORDER — INSULIN ASPART 100 UNIT/ML ~~LOC~~ SOLN: 0.0000 [IU] | Freq: Every day | SUBCUTANEOUS | Status: DC

## 2018-02-15 MED ORDER — ZOLPIDEM TARTRATE 5 MG PO TABS: 2.5000 mg | ORAL_TABLET | Freq: Every evening | ORAL | Status: DC | PRN

## 2018-02-15 MED ORDER — CLOPIDOGREL BISULFATE 75 MG PO TABS
75.0000 mg | ORAL_TABLET | Freq: Every day | ORAL | Status: DC
  Administered 2018-02-15 – 2018-02-17 (×3): 75 mg via ORAL
  Filled 2018-02-15 (×3): qty 1

## 2018-02-15 MED ORDER — SENNOSIDES-DOCUSATE SODIUM 8.6-50 MG PO TABS: 1.0000 | ORAL_TABLET | Freq: Every evening | ORAL | Status: DC | PRN

## 2018-02-15 MED ORDER — VITAMIN D 1000 UNITS PO TABS
2000.0000 [IU] | ORAL_TABLET | Freq: Every day | ORAL | Status: DC
  Administered 2018-02-15 – 2018-02-17 (×2): 2000 [IU] via ORAL
  Filled 2018-02-15 (×2): qty 2

## 2018-02-15 MED ORDER — NITROGLYCERIN 0.4 MG SL SUBL: 0.4000 mg | SUBLINGUAL_TABLET | SUBLINGUAL | Status: DC | PRN

## 2018-02-15 MED ORDER — FENOFIBRATE 160 MG PO TABS
160.0000 mg | ORAL_TABLET | Freq: Every day | ORAL | Status: DC
  Administered 2018-02-16 – 2018-02-17 (×2): 160 mg via ORAL
  Filled 2018-02-15 (×3): qty 1

## 2018-02-15 MED ORDER — FLUTICASONE PROPIONATE HFA 110 MCG/ACT IN AERO: 2.0000 | INHALATION_SPRAY | Freq: Every day | RESPIRATORY_TRACT | Status: DC

## 2018-02-15 MED ORDER — ADULT MULTIVITAMIN W/MINERALS CH
1.0000 | ORAL_TABLET | Freq: Every day | ORAL | Status: DC
  Administered 2018-02-15 – 2018-02-17 (×2): 1 via ORAL
  Filled 2018-02-15 (×2): qty 1

## 2018-02-15 MED ORDER — SODIUM CHLORIDE 0.9 % IV SOLN
1.0000 g | Freq: Once | INTRAVENOUS | Status: AC
  Administered 2018-02-15: 1 g via INTRAVENOUS
  Filled 2018-02-15: qty 1

## 2018-02-15 MED ORDER — OMEGA-3-ACID ETHYL ESTERS 1 G PO CAPS
4000.0000 mg | ORAL_CAPSULE | Freq: Every day | ORAL | Status: DC
  Administered 2018-02-16: 4000 mg via ORAL
  Filled 2018-02-15: qty 4

## 2018-02-15 MED ORDER — SODIUM CHLORIDE 0.9% FLUSH
3.0000 mL | Freq: Two times a day (BID) | INTRAVENOUS | Status: DC
  Administered 2018-02-15 – 2018-02-17 (×4): 3 mL via INTRAVENOUS

## 2018-02-15 MED ORDER — AMIODARONE HCL 200 MG PO TABS
200.0000 mg | ORAL_TABLET | Freq: Every day | ORAL | Status: DC
  Administered 2018-02-15 – 2018-02-17 (×3): 200 mg via ORAL
  Filled 2018-02-15 (×3): qty 1

## 2018-02-15 MED ORDER — SODIUM CHLORIDE 0.9 % IV SOLN
1.0000 g | INTRAVENOUS | Status: DC
  Administered 2018-02-16: 1 g via INTRAVENOUS
  Filled 2018-02-15 (×2): qty 1

## 2018-02-15 MED ORDER — ACETAMINOPHEN 650 MG RE SUPP: 650.0000 mg | Freq: Four times a day (QID) | RECTAL | Status: DC | PRN

## 2018-02-15 MED ORDER — ALBUTEROL SULFATE (2.5 MG/3ML) 0.083% IN NEBU: 2.5000 mg | INHALATION_SOLUTION | Freq: Four times a day (QID) | RESPIRATORY_TRACT | Status: DC | PRN

## 2018-02-15 MED ORDER — SODIUM CHLORIDE 0.9 % IV BOLUS
500.0000 mL | Freq: Once | INTRAVENOUS | Status: AC
  Administered 2018-02-15: 500 mL via INTRAVENOUS

## 2018-02-15 MED ORDER — VANCOMYCIN HCL IN DEXTROSE 1-5 GM/200ML-% IV SOLN
1000.0000 mg | INTRAVENOUS | Status: DC
  Administered 2018-02-16: 1000 mg via INTRAVENOUS
  Filled 2018-02-15: qty 200

## 2018-02-15 MED ORDER — ASPIRIN EC 81 MG PO TBEC
81.0000 mg | DELAYED_RELEASE_TABLET | Freq: Every day | ORAL | Status: DC
  Administered 2018-02-15 – 2018-02-16 (×2): 81 mg via ORAL
  Filled 2018-02-15 (×2): qty 1

## 2018-02-15 MED ORDER — INSULIN ASPART 100 UNIT/ML ~~LOC~~ SOLN: 0.0000 [IU] | Freq: Three times a day (TID) | SUBCUTANEOUS | Status: DC

## 2018-02-15 MED ORDER — HEPARIN SODIUM (PORCINE) 5000 UNIT/ML IJ SOLN
5000.0000 [IU] | Freq: Three times a day (TID) | INTRAMUSCULAR | Status: DC
  Administered 2018-02-15 – 2018-02-17 (×3): 5000 [IU] via SUBCUTANEOUS
  Filled 2018-02-15 (×3): qty 1

## 2018-02-15 MED ORDER — ATORVASTATIN CALCIUM 20 MG PO TABS
40.0000 mg | ORAL_TABLET | Freq: Every day | ORAL | Status: DC
  Administered 2018-02-15 – 2018-02-16 (×2): 40 mg via ORAL
  Filled 2018-02-15 (×2): qty 2

## 2018-02-15 MED ORDER — SODIUM CHLORIDE 0.9 % IV SOLN
250.0000 mL | INTRAVENOUS | Status: DC | PRN
  Administered 2018-02-16: 250 mL via INTRAVENOUS

## 2018-02-15 NOTE — ED Notes (Signed)
Pt ambulatory to toilet with one assist.  

## 2018-02-15 NOTE — ED Notes (Signed)
Pt given diet sandwich tray and cup of ice and 12oz cup of water with ice

## 2018-02-15 NOTE — ED Triage Notes (Signed)
Pt arrived via EMS from home with reports of dizziness, lightheadedness and weakness that started this morning around 0700.  Pt recently started on HD and had a 3 hr treatment yesterday.    Per EMS reports pt has HHRN and reported pt had shortness of breath and low sats in the 80s for 10 mins prior to EMS arrival however, VSS with EMS.  On arrival pt is alert and oriented, no slurred speech. Neurologically intact on arrival. Pt denies any CP, SOB at this time.

## 2018-02-15 NOTE — ED Provider Notes (Signed)
[facility] EMERGENCY DEPARTMENT Provider Note   CSN: [id number] Arrival date & time: 02/15/18  1020     History   Chief Complaint Chief Complaint  Patient presents with  . Weakness    HPI Matthew [name] is a 84 y.o. male history of CAD, diabetes, CKD on dialysis (last dialysis yesterday), here presenting with dizziness, lightheadedness.  Patient states that he had 3 hours of dialysis yesterday which is longer than usual.  States that he did not get extra fluid taken off.  He states that he feels fine until this morning.  He woke up around 7 AM and felt lightheaded and dizzy.  He tries to stick up and stand up but feels like he was going to fall.  He felt like he is on pass out but did not pass out.  Denies room spinning or falling.  Denies any chest pain or shortness of breath.  He states that his weight is stable.  Denies any trouble speaking or history of strokes.  Patient was recently admitted for hypoglycemia but his CBG was normal per EMS.  The history is provided by the patient.    Past Medical History:  Diagnosis Date  . Anemia   . Arthritis   . Broken arm 05/2013   Left  . CHF (congestive heart failure) ([place])   . Chronic kidney disease   . Coronary artery disease   . Diabetes mellitus without complication ([place])   . Dyspnea    DOE  . Edema    FEET/LEGS OCCAS  . Hypertension   . Myocardial infarction [facility])     Patient Active Problem List   Diagnosis Date Noted  . Fluid overload 01/10/2018  . Hypoglycemia 12/09/2017  . CAD (coronary artery disease) of bypass graft 12/04/2013  . Unstable angina; Class III Angina 12/03/2013  . Diabetes mellitus type 2, uncomplicated ([place]) 62/94/7654  . HLD (hyperlipidemia) 12/03/2013  . HTN (hypertension) 12/03/2013    Past Surgical History:  Procedure Laterality Date  . CATARACT EXTRACTION W/PHACO Left 07/26/2017   Procedure: CATARACT EXTRACTION PHACO AND INTRAOCULAR LENS PLACEMENT (IOC);  Surgeon:  Birder Robson, MD;  Location: [facility] ORS;  Service: Ophthalmology;  Laterality: Left;  [place]   00:45.8 AP%  13.1 CDE  6.00 Fluid Pack Lot # [id number]  . CATARACT EXTRACTION W/PHACO Right 08/17/2017   Procedure: CATARACT EXTRACTION PHACO AND INTRAOCULAR LENS PLACEMENT (IOC);  Surgeon: Birder Robson, MD;  Location: [facility] ORS;  Service: Ophthalmology;  Laterality: Right;  [place]  01:30 AP% 16.8 CDE 15.24 Fluid pack lot # [id number]  . CHOLECYSTECTOMY    . CORONARY ANGIOPLASTY     STENTS  . CORONARY ARTERY BYPASS GRAFT    . DIALYSIS/PERMA CATHETER INSERTION N/A 02/09/2018   Procedure: DIALYSIS/PERMA CATHETER INSERTION;  Surgeon: Algernon Huxley, MD;  Location: [facility];  Service: Cardiovascular;  Laterality: N/A;  . FRACTURE SURGERY     ARM  . LEFT HEART CATHETERIZATION WITH CORONARY/GRAFT ANGIOGRAM Left 12/04/2013   Procedure: LEFT HEART CATHETERIZATION WITH Beatrix Fetters;  Surgeon: Leonie Man, MD;  Location: [facility] CATH LAB;  Service: Cardiovascular;  Laterality: Left;  . PERCUTANEOUS CORONARY STENT INTERVENTION (PCI-S) N/A 12/06/2013   Procedure: PERCUTANEOUS CORONARY STENT INTERVENTION (PCI-S);  Surgeon: Sinclair Grooms, MD;  Location: [facility] CATH LAB;  Service: Cardiovascular;  Laterality: N/A;  . VEIN BYPASS SURGERY     [place] Bypass Surgery         Home Medications    Prior to Admission  medications   Medication Sig Start Date End Date Taking? Authorizing Provider  amiodarone (PACERONE) 200 MG tablet Take 200 mg by mouth daily.    [provider]  aspirin EC 81 MG tablet Take 81 mg by mouth at bedtime.    [provider]  atorvastatin (LIPITOR) 40 MG tablet Take 40 mg by mouth at bedtime.    [provider]  carvedilol (COREG) 3.125 MG tablet Take 1 tablet (3.125 mg total) by mouth 2 (two) times daily with a meal. 01/20/18   Hillary Bow, MD  Cholecalciferol (VITAMIN D3) 2000 UNITS TABS Take 2,000 Units by mouth daily with breakfast.     [provider]  clopidogrel (PLAVIX) 75 MG tablet Take 1 tablet (75 mg total) by mouth daily with breakfast. 12/07/13   Almyra Deforest, PA  fenofibrate 160 MG tablet Take 160 mg by mouth daily.    [provider]  FLOVENT HFA 110 MCG/ACT inhaler Inhale 2 puffs into the lungs daily. 12/01/17   [provider]  fluticasone (FLONASE) 50 MCG/ACT nasal spray Place 2 sprays into both nostrils at bedtime.    [provider]  furosemide (LASIX) 40 MG tablet Take 2 tablets (80 mg total) by mouth 2 (two) times daily. 01/15/18   Saundra Shelling, MD  Multiple Vitamins-Minerals (CENTRUM SILVER ADULT 50+) TABS Take 1 tablet by mouth daily with breakfast.    [provider]  nitroGLYCERIN (NITROSTAT) 0.4 MG SL tablet Place 0.4 mg under the tongue every 5 (five) minutes as needed for chest pain.    [provider]  Omega-3 Fatty Acids (FISH OIL) 1000 MG CAPS Take 4,000 mg by mouth daily at 12 noon.     [provider]  potassium chloride SA (K-DUR,KLOR-CON) 20 MEQ tablet Take 2 tablets (40 mEq total) by mouth daily. 01/16/18 02/15/18  Saundra Shelling, MD  PROAIR HFA 108 (90 Base) MCG/ACT inhaler Inhale 2 puffs into the lungs every 6 (six) hours as needed. 09/20/17   [provider]  zolpidem (AMBIEN) 5 MG tablet Take 2.5 mg by mouth at bedtime as needed for sleep.    [provider]    Family History Family History  Problem Relation Age of Onset  . Heart attack Father 35       died first MI at age 68  . Heart failure Mother     Social History Social History   Tobacco Use  . Smoking status: Former Smoker    Types: Cigarettes  . Smokeless tobacco: Never Used  . Tobacco comment: quit 30 yrs ago, smoked 14-15 yrs, mainly pipe  Substance Use Topics  . Alcohol use: Yes    Alcohol/week: 1.0 standard drinks    Types: 1 Glasses of wine per week    Comment: occasionally drinking only  . Drug use: No     Allergies    Amoxicillin   Review of Systems Review of Systems  Neurological: Positive for dizziness and weakness.  All other systems reviewed and are negative.    Physical Exam Updated Vital Signs BP 125/81   Pulse (!) 112   Temp 97.9 F (36.6 C) (Oral)   Resp (!) 23   Ht 6' (1.829 m)   Wt 89.4 kg   SpO2 100%   BMI 26.75 kg/m   Physical Exam  Constitutional: He is oriented to person, place, and time.  Chronically ill, slightly dehydrated   HENT:  Head: Normocephalic.  Eyes: Pupils are equal, round, and reactive to light. Conjunctivae  and EOM are normal.  No obvious nystagmus   Neck: Normal range of motion. Neck supple.  Cardiovascular:  Slightly tachycardic, irregular   Pulmonary/Chest: Effort normal and breath sounds normal. No stridor. No respiratory distress.  Abdominal: Soft. Bowel sounds are normal.  Musculoskeletal: Normal range of motion.  Neurological: He is alert and oriented to person, place, and time. No cranial nerve deficit. Coordination normal.  CN 2 - 12 intact. Nl strength and sensation throughout   Skin: Skin is warm.  Psychiatric: He has a normal mood and affect.  Nursing note and vitals reviewed.    ED Treatments / Results  Labs (all labs ordered are listed, but only abnormal results are displayed) Labs Reviewed  CBC WITH DIFFERENTIAL/PLATELET - Abnormal; Notable for the following components:      Result Value   RBC 3.83 (*)    Hemoglobin 11.9 (*)    HCT 34.3 (*)    RDW 17.9 (*)    Platelets 129 (*)    Lymphs Abs 0.7 (*)    All other components within normal limits  COMPREHENSIVE METABOLIC PANEL - Abnormal; Notable for the following components:   Glucose, Bld 149 (*)    BUN 39 (*)    Creatinine, Ser 3.39 (*)    Total Protein 6.4 (*)    AST 42 (*)    Total Bilirubin 1.4 (*)    GFR calc non Af Amer 15 (*)    GFR calc Af Amer 18 (*)    All other components within normal limits  CULTURE, BLOOD (ROUTINE X 2)  CULTURE, BLOOD (ROUTINE X 2)   TROPONIN I  LACTIC ACID, PLASMA    EKG EKG Interpretation  Date/Time:  Wednesday February 15 2018 10:33:16 EDT Ventricular Rate:  93 PR Interval:    QRS Duration: 117 QT Interval:  381 QTC Calculation: 474 R Axis:   -7 Text Interpretation:  Atrial fibrillation Incomplete left bundle branch block Since last tracing rate faster Confirmed by Wandra Arthurs [phone]) on 02/15/2018 10:59:31 AM   Radiology Dg Chest 2 View  Result Date: 02/15/2018 CLINICAL DATA:  Dizziness, lightheadedness and weakness since this morning at 7 a.m. EXAM: CHEST - 2 VIEW COMPARISON:  01/10/2018. FINDINGS: Interval right jugular catheter with its tip in the superior vena cava near the cavoatrial junction. Stable enlarged cardiac silhouette and tortuous aorta. Stable right coronary artery stent. Interval mild patchy opacity in the left lower lobe. Remainder of the lungs are clear. Thoracic spine degenerative changes. IMPRESSION: 1. Interval mild patchy atelectasis or pneumonia in the left lower lobe. 2. Stable cardiomegaly. Electronically Signed   By: Claudie Revering M.D.   On: 02/15/2018 11:39   Ct Head Wo Contrast  Result Date: 02/15/2018 CLINICAL DATA:  Dizziness, lightheadedness, weakness EXAM: CT HEAD WITHOUT CONTRAST TECHNIQUE: Contiguous axial images were obtained from the base of the skull through the vertex without intravenous contrast. COMPARISON:  01/18/2018 FINDINGS: Brain: No evidence of acute infarction, hemorrhage, extra-axial collection, ventriculomegaly, or mass effect. Cystic mass in the right middle cranial fossa most consistent with an arachnoid cyst generalized cerebral atrophy. Periventricular white matter low attenuation likely secondary to microangiopathy. Vascular: Cerebrovascular atherosclerotic calcifications are noted. Skull: Negative for fracture or focal lesion. Sinuses/Orbits: Visualized portions of the orbits are unremarkable. Visualized portions of the paranasal sinuses and mastoid air cells  are unremarkable. Other: None. IMPRESSION: No acute intracranial pathology. Electronically Signed   By: Kathreen Devoid   On: 02/15/2018 11:29    Procedures Procedures (including critical  care time)  CRITICAL CARE Performed by: Wandra Arthurs   Total critical care time: 30 minutes  Critical care time was exclusive of separately billable procedures and treating other patients.  Critical care was necessary to treat or prevent imminent or life-threatening deterioration.  Critical care was time spent personally by me on the following activities: development of treatment plan with patient and/or surrogate as well as nursing, discussions with consultants, evaluation of patient's response to treatment, examination of patient, obtaining history from patient or surrogate, ordering and performing treatments and interventions, ordering and review of laboratory studies, ordering and review of radiographic studies, pulse oximetry and re-evaluation of patient's condition.   Medications Ordered in ED Medications  sodium chloride 0.9 % bolus 500 mL (0 mLs Intravenous Stopped 02/15/18 1303)  sodium chloride 0.9 % bolus 500 mL (500 mLs Intravenous New Bag/Given 02/15/18 1310)  vancomycin (VANCOCIN) IVPB 1000 mg/200 mL premix (1,000 mg Intravenous New Bag/Given 02/15/18 1314)  ceFEPIme (MAXIPIME) 1 g in sodium chloride 0.9 % 100 mL IVPB (1 g Intravenous New Bag/Given 02/15/18 1318)     Initial Impression / Assessment and Plan / ED Course  I have reviewed the triage vital signs and the nursing notes.  Pertinent labs & imaging results that were available during my care of the patient were reviewed by me and considered in my medical decision making (see chart for details).     Mclean Moya is a 84 y.o. male here with dizziness. Appears to be in rapid afib and borderline hypotensive. He had longer dialysis session yesterday so I think he likely got over dialyzed vs rapid afib with hypotension vs orthostasis. He  is not on blood thinners currently. Will get labs, CXR, CT head. Will give 500 cc bolus and reassess.   2:23 PM WBC 5. CXR showed possible pneumonia. Cr 3.3. Ordered IV abx given that he was hypotensive. I am concerned that he is septic from pneumonia. Also was in rapid afib but hx of afib not anticoagulated. Will admit for IV abx, echo.   Final Clinical Impressions(s) / ED Diagnoses   Final diagnoses:  None    ED Discharge Orders    None       Drenda Freeze, MD 02/15/18 1423

## 2018-02-15 NOTE — H&P (Addendum)
[facility] [place] NAME: Matthew [name]    MR#:  [id number]  DATE OF BIRTH:  1930-06-24  DATE OF ADMISSION:  02/15/2018  PRIMARY CARE PHYSICIAN: Jodi Marble, MD   REQUESTING/REFERRING PHYSICIAN:   CHIEF COMPLAINT:   Chief Complaint  Patient presents with  . Weakness    HISTORY OF PRESENT ILLNESS: Matthew [name]  is a 84 y.o. male with a known history of end-stage renal disease on dialysis, type 2 diabetes mellitus, coronary disease, chronic congestive heart failure, hypertension presented to the emergency room for generalized weakness.  Patient felt dizzy but did not lose balance or fall down.  Patient was found to have low blood pressure in the emergency room was given IV fluids.  His chest x-ray revealed pneumonia.  He was started on broad-spectrum IV antibiotics.  Patient was recently started on dialysis and he had couple of sessions of dialysis so far.  No complaints of any chest pain.  Patient in the emergency room was worked up with CT head which showed no acute intracranial abnormality.  PAST MEDICAL HISTORY:   Past Medical History:  Diagnosis Date  . Anemia   . Arthritis   . Broken arm 05/2013   Left  . CHF (congestive heart failure) ([place])   . Chronic kidney disease   . Coronary artery disease   . Diabetes mellitus without complication ([place])   . Dyspnea    DOE  . Edema    FEET/LEGS OCCAS  . Hypertension   . Myocardial infarction [facility])     PAST SURGICAL HISTORY:  Past Surgical History:  Procedure Laterality Date  . CATARACT EXTRACTION W/PHACO Left 07/26/2017   Procedure: CATARACT EXTRACTION PHACO AND INTRAOCULAR LENS PLACEMENT (IOC);  Surgeon: Birder Robson, MD;  Location: [facility] ORS;  Service: Ophthalmology;  Laterality: Left;  [place]   00:45.8 AP%  13.1 CDE  6.00 Fluid Pack Lot # [id number]  . CATARACT EXTRACTION W/PHACO Right 08/17/2017   Procedure: CATARACT EXTRACTION PHACO AND INTRAOCULAR LENS PLACEMENT (IOC);   Surgeon: Birder Robson, MD;  Location: [facility] ORS;  Service: Ophthalmology;  Laterality: Right;  [place]  01:30 AP% 16.8 CDE 15.24 Fluid pack lot # [id number]  . CHOLECYSTECTOMY    . CORONARY ANGIOPLASTY     STENTS  . CORONARY ARTERY BYPASS GRAFT    . DIALYSIS/PERMA CATHETER INSERTION N/A 02/09/2018   Procedure: DIALYSIS/PERMA CATHETER INSERTION;  Surgeon: Algernon Huxley, MD;  Location: [facility] CV LAB;  Service: Cardiovascular;  Laterality: N/A;  . FRACTURE SURGERY     ARM  . LEFT HEART CATHETERIZATION WITH CORONARY/GRAFT ANGIOGRAM Left 12/04/2013   Procedure: LEFT HEART CATHETERIZATION WITH Beatrix Fetters;  Surgeon: Leonie Man, MD;  Location: [facility] CATH LAB;  Service: Cardiovascular;  Laterality: Left;  . PERCUTANEOUS CORONARY STENT INTERVENTION (PCI-S) N/A 12/06/2013   Procedure: PERCUTANEOUS CORONARY STENT INTERVENTION (PCI-S);  Surgeon: Sinclair Grooms, MD;  Location: [facility] CATH LAB;  Service: Cardiovascular;  Laterality: N/A;  . VEIN BYPASS SURGERY     [place] By[facility]     SOCIAL HISTORY:  Social History   Tobacco Use  . Smoking status: Former Smoker    Types: Cigarettes  . Smokeless tobacco: Never Used  . Tobacco comment: quit 30 yrs ago, smoked 14-15 yrs, mainly pipe  Substance Use Topics  . Alcohol use: Yes    Alcohol/week: 1.0 standard drinks    Types: 1 Glasses of wine per week    Comment: occasionally drinking  only    FAMILY HISTORY:  Family History  Problem Relation Age of Onset  . Heart attack Father 26       died first MI at age 25  . Heart failure Mother     DRUG ALLERGIES:  Allergies  Allergen Reactions  . Amoxicillin Diarrhea    REVIEW OF SYSTEMS:   CONSTITUTIONAL: No fever,has fatigue and weakness.  EYES: No blurred or double vision.  EARS, NOSE, AND THROAT: No tinnitus or ear pain.  RESPIRATORY: occasional cough, no shortness of breath, wheezing or hemoptysis.  CARDIOVASCULAR: No chest pain, orthopnea, edema.   GASTROINTESTINAL: No nausea, vomiting, diarrhea or abdominal pain.  GENITOURINARY: No dysuria, hematuria.  ENDOCRINE: No polyuria, nocturia,  HEMATOLOGY: No anemia, easy bruising or bleeding SKIN: No rash or lesion. MUSCULOSKELETAL: No joint pain or arthritis.   NEUROLOGIC: No tingling, numbness, weakness.  Has dizziness PSYCHIATRY: No anxiety or depression.   MEDICATIONS AT HOME:  Prior to Admission medications   Medication Sig Start Date End Date Taking? Authorizing Provider  amiodarone (PACERONE) 200 MG tablet Take 200 mg by mouth daily.   Yes [provider]  atorvastatin (LIPITOR) 40 MG tablet Take 40 mg by mouth at bedtime.   Yes [provider]  carvedilol (COREG) 3.125 MG tablet Take 1 tablet (3.125 mg total) by mouth 2 (two) times daily with a meal. 01/20/18  Yes Sudini, Alveta Heimlich, MD  Cholecalciferol (VITAMIN D3) 2000 UNITS TABS Take 2,000 Units by mouth daily with breakfast.   Yes [provider]  clopidogrel (PLAVIX) 75 MG tablet Take 1 tablet (75 mg total) by mouth daily with breakfast. 12/07/13  Yes Almyra Deforest, PA  fenofibrate 160 MG tablet Take 160 mg by mouth daily.   Yes [provider]  ferrous sulfate 325 (65 FE) MG tablet Take 650 mg by mouth daily with breakfast.   Yes [provider]  fluticasone (FLONASE) 50 MCG/ACT nasal spray Place 2 sprays into both nostrils at bedtime.   Yes [provider]  furosemide (LASIX) 40 MG tablet Take 2 tablets (80 mg total) by mouth 2 (two) times daily. Patient taking differently: Take 80 mg by mouth daily.  01/15/18  Yes Pyreddy, Reatha Harps, MD  levocetirizine (XYZAL) 5 MG tablet Take 5 mg by mouth every evening.   Yes [provider]  Multiple Vitamins-Minerals (CENTRUM SILVER ADULT 50+) TABS Take 1 tablet by mouth daily with breakfast.   Yes [provider]  nitroGLYCERIN (NITROSTAT) 0.4 MG SL tablet Place 0.4 mg under the tongue every 5 (five) minutes as needed for chest  pain.   Yes [provider]  Omega-3 Fatty Acids (FISH OIL) 1000 MG CAPS Take 4,000 mg by mouth daily at 12 noon.    Yes [provider]  potassium chloride SA (K-DUR,KLOR-CON) 20 MEQ tablet Take 2 tablets (40 mEq total) by mouth daily. 01/16/18 02/15/18 Yes Pyreddy, Reatha Harps, MD  PROAIR HFA 108 [phone] Base) MCG/ACT inhaler Inhale 2 puffs into the lungs every 6 (six) hours as needed. 09/20/17  Yes [provider]      PHYSICAL EXAMINATION:   VITAL SIGNS: Blood pressure 125/81, pulse (!) 112, temperature 97.9 F (36.6 C), temperature source Oral, resp. rate (!) 23, height 6' (1.829 m), weight 89.4 kg, SpO2 100 %.  GENERAL:  84 y.o.-year-old patient lying in the bed with no acute distress.  EYES: Pupils equal, round, reactive to light and accommodation. No scleral icterus. Extraocular muscles intact.  HEENT: Head atraumatic, normocephalic. Oropharynx and nasopharynx  clear.  NECK:  Supple, no jugular venous distention. No thyroid enlargement, no tenderness.  LUNGS: Decreased breath sounds bilaterally, Rales heard in left lung. No use of accessory muscles of respiration.  CARDIOVASCULAR: S1, S2 normal. No murmurs, rubs, or gallops.  ABDOMEN: Soft, nontender, nondistended. Bowel sounds present. No organomegaly or mass.  EXTREMITIES: No pedal edema, cyanosis, or clubbing.  NEUROLOGIC: Cranial nerves II through XII are intact. Muscle strength 5/5 in all extremities. Sensation intact. Gait not checked.  PSYCHIATRIC: The patient is alert and oriented x 3.  SKIN: No obvious rash, lesion, or ulcer.   LABORATORY PANEL:   CBC Recent Labs  Lab 02/15/18 1039  WBC 5.5  HGB 11.9*  HCT 34.3*  PLT 129*  MCV 89.5  MCH 31.0  MCHC 34.6  RDW 17.9*  LYMPHSABS 0.7*  MONOABS 0.8  EOSABS 0.2  BASOSABS 0.1   ------------------------------------------------------------------------------------------------------------------  Chemistries  Recent Labs  Lab 02/15/18 1039  NA 135  K  3.5  CL 98  CO2 24  GLUCOSE 149*  BUN 39*  CREATININE 3.39*  CALCIUM 9.1  AST 42*  ALT 21  ALKPHOS 42  BILITOT 1.4*   ------------------------------------------------------------------------------------------------------------------ estimated creatinine clearance is 17.5 mL/min (A) (by C-G formula based on SCr of 3.39 mg/dL (H)). ------------------------------------------------------------------------------------------------------------------ No results for input(s): TSH, T4TOTAL, T3FREE, THYROIDAB in the last 72 hours.  Invalid input(s): FREET3   Coagulation profile No results for input(s): INR, PROTIME in the last 168 hours. ------------------------------------------------------------------------------------------------------------------- No results for input(s): DDIMER in the last 72 hours. -------------------------------------------------------------------------------------------------------------------  Cardiac Enzymes Recent Labs  Lab 02/15/18 1039  TROPONINI <0.03   ------------------------------------------------------------------------------------------------------------------ Invalid input(s): POCBNP  ---------------------------------------------------------------------------------------------------------------  Urinalysis    Component Value Date/Time   COLORURINE YELLOW (A) 12/09/2017 1510   APPEARANCEUR HAZY (A) 12/09/2017 1510   LABSPEC 1.009 12/09/2017 1510   PHURINE 5.0 12/09/2017 1510   GLUCOSEU NEGATIVE 12/09/2017 1510   HGBUR NEGATIVE 12/09/2017 1510   BILIRUBINUR NEGATIVE 12/09/2017 1510   KETONESUR NEGATIVE 12/09/2017 1510   PROTEINUR NEGATIVE 12/09/2017 1510   NITRITE NEGATIVE 12/09/2017 1510   LEUKOCYTESUR NEGATIVE 12/09/2017 1510     RADIOLOGY: Dg Chest 2 View  Result Date: 02/15/2018 CLINICAL DATA:  Dizziness, lightheadedness and weakness since this morning at 7 a.m. EXAM: CHEST - 2 VIEW COMPARISON:  01/10/2018. FINDINGS: Interval right  jugular catheter with its tip in the superior vena cava near the cavoatrial junction. Stable enlarged cardiac silhouette and tortuous aorta. Stable right coronary artery stent. Interval mild patchy opacity in the left lower lobe. Remainder of the lungs are clear. Thoracic spine degenerative changes. IMPRESSION: 1. Interval mild patchy atelectasis or pneumonia in the left lower lobe. 2. Stable cardiomegaly. Electronically Signed   By: Claudie Revering M.D.   On: 02/15/2018 11:39   Ct Head Wo Contrast  Result Date: 02/15/2018 CLINICAL DATA:  Dizziness, lightheadedness, weakness EXAM: CT HEAD WITHOUT CONTRAST TECHNIQUE: Contiguous axial images were obtained from the base of the skull through the vertex without intravenous contrast. COMPARISON:  01/18/2018 FINDINGS: Brain: No evidence of acute infarction, hemorrhage, extra-axial collection, ventriculomegaly, or mass effect. Cystic mass in the right middle cranial fossa most consistent with an arachnoid cyst generalized cerebral atrophy. Periventricular white matter low attenuation likely secondary to microangiopathy. Vascular: Cerebrovascular atherosclerotic calcifications are noted. Skull: Negative for fracture or focal lesion. Sinuses/Orbits: Visualized portions of the orbits are unremarkable. Visualized portions of the paranasal sinuses and mastoid air cells are unremarkable. Other: None. IMPRESSION: No acute intracranial pathology. Electronically Signed   By: Elbert Ewings  Patel   On: 02/15/2018 11:29    EKG: Orders placed or performed during the hospital encounter of 02/15/18  . EKG 12-Lead  . EKG 12-Lead  . EKG 12-Lead  . EKG 12-Lead    IMPRESSION AND PLAN:  84 year old male patient with history of end-stage renal disease on dialysis, chronic congestive heart failure, diabetes mellitus type 2, hypertension, coronary artery disease presented to the emergency room for weakness, dizziness  -Sepsis secondary to pneumonia Start patient on broad-spectrum  antibiotics vancomycin and cefepime intravenously Follow-up cultures  -Hypotension secondary to sepsis/orthostatic Responded to IV fluids Received a liter fluid in ER Antibiotics continue Hold blood pressure medications  -Left lung pneumonia Continue IV vancomycin and cefepime antibiotics Oxygen as needed via nasal cannula  -End-stage renal disease Dialysis Tuesday Thursday and Saturday Nephrology consultation  -Type 2 diabetes mellitus Diabetic diet with sliding scale coverage with insulin  -DVT prophylaxis subcu heparin  All the records are reviewed and case discussed with ED provider. Management plans discussed with the patient, family and they are in agreement.  CODE STATUS:DNR Code Status History    Date Active Date Inactive Code Status Order ID Comments User Context   01/19/2018 0104 01/20/2018 1812 DNR [id number]  Amelia Jo, MD Inpatient   01/10/2018 1248 01/15/2018 1546 DNR [id number]  Loletha Grayer, MD Inpatient   01/10/2018 1224 01/10/2018 1248 Full Code [id number]  Loletha Grayer, MD Inpatient   12/09/2017 1835 12/11/2017 1508 Full Code [id number]  Loletha Grayer, MD Inpatient   12/06/2013 1125 12/07/2013 1536 Full Code [id number]  Belva Crome, MD Inpatient   12/04/2013 0955 12/06/2013 1125 Full Code [id number]  Leonie Man, MD Inpatient   12/03/2013 0407 12/04/2013 0955 Full Code [id number]  Rise Patience, MD Inpatient    Questions for Most Recent Historical Code Status (Order [id number])    Question Answer Comment   In the event of cardiac or respiratory ARREST Do not call a "code blue"    In the event of cardiac or respiratory ARREST Do not perform Intubation, CPR, defibrillation or ACLS    In the event of cardiac or respiratory ARREST Use medication by any route, position, wound care, and other measures to relive pain and suffering. May use oxygen, suction and manual treatment of airway obstruction as needed for comfort.    Comments nurse may pronounce          Advance Directive Documentation     Most Recent Value  Type of Advance Directive  Healthcare Power of Attorney, Living will  Pre-existing out of facility DNR order (yellow form or pink MOST form)  -  "MOST" Form in Place?  -       TOTAL TIME TAKING CARE OF THIS PATIENT: 51 minutes.    Saundra Shelling M.D on 02/15/2018 at 2:41 PM  Between 7am to 6pm - Pager - [phone]  After 6pm go to [email or web address] - password [email or web address]  Office  [phone]  CC: Primary care physician; Jodi Marble, MD

## 2018-02-15 NOTE — Progress Notes (Signed)
Advanced care plan.  Purpose of the Encounter: CODE STATUS  Parties in Attendance: Patient and family  Patient's Decision Capacity: Good  Subjective/Patient's story: Presented to the emergency room for dizziness and weakness and occasional cough   Objective/Medical story Has low blood pressure, pneumonia and sepsis Needs IV antibiotics   Goals of care determination:  Advance care directives and goals of care discussed Patient has been recently started on dialysis Needs dialysis on Tuesday Thursday and Saturday Patient does not want any cardiac resuscitation, intubation ventilator if the need arises   CODE STATUS: DNR   Time spent discussing advanced care planning: 16 minutes

## 2018-02-15 NOTE — ED Notes (Signed)
Patient transported to CT 

## 2018-02-15 NOTE — ED Notes (Signed)
Pt reports was supposed to start dialysis yesterday but did not have any fluid removed. Pt states this am he felt dizzy after doing some things. Pt states that he may have been pushing it. Pt denies pain, SOB, reports just feels weak and dizzy.

## 2018-02-15 NOTE — Consult Note (Signed)
Pharmacy Antibiotic Note  Matthew [name] is a 84 y.o. male admitted on 02/15/2018 with sepsis secondary to pneumonia.  Pharmacy has been consulted for cefepime and vancomycin dosing. Patient received vancomycin 1g and cefepime 1 g IV x 1 dose in ED.   Patient has PMH of ESRD and receives HD on Tues, Thurs, Sat  Plan: Will order vancomycin 1g IV x 1 dose additional dose for a total of vancomycin 2000mg  LD. Will order Vancomycin 1g IV to be given with HD on HD days. Will order vanc level prior to 3rd HD session.  Target  Pre-HD level : 15-25 mcg/mL   Start cefepime 1g IV every 24 hours.   Height: 6' (182.9 cm) Weight: 197 lb 3.2 oz (89.4 kg) IBW/kg (Calculated) : 77.6  Temp (24hrs), Avg:97.9 F (36.6 C), Min:97.9 F (36.6 C), Max:97.9 F (36.6 C)  Recent Labs  Lab 02/15/18 1039 02/15/18 1207  WBC 5.5  --   CREATININE 3.39*  --   LATICACIDVEN  --  1.8    Estimated Creatinine Clearance: 17.5 mL/min (A) (by C-G formula based on SCr of 3.39 mg/dL (H)).    Allergies  Allergen Reactions  . Amoxicillin Diarrhea    Antimicrobials this admission: 9/25 cefepime  >>  9/25 vancomycin >>   Microbiology results: 9/25 BCx: pending 9/25 MRSA PCR: pending  Thank you for allowing pharmacy to be a part of this patient's care.  Pernell Dupre, PharmD, BCPS Clinical Pharmacist 02/15/2018 2:53 PM

## 2018-02-16 LAB — BASIC METABOLIC PANEL
Anion gap: 8 (ref 5–15)
BUN: 39 mg/dL — ABNORMAL HIGH (ref 8–23)
CHLORIDE: 103 mmol/L (ref 98–111)
CO2: 30 mmol/L (ref 22–32)
Calcium: 9.3 mg/dL (ref 8.9–10.3)
Creatinine, Ser: 3.9 mg/dL — ABNORMAL HIGH (ref 0.61–1.24)
GFR calc Af Amer: 15 mL/min — ABNORMAL LOW (ref >60)
GFR calc non Af Amer: 13 mL/min — ABNORMAL LOW (ref >60)
Glucose, Bld: 123 mg/dL — ABNORMAL HIGH (ref 70–99)
POTASSIUM: 3.9 mmol/L (ref 3.5–5.1)
SODIUM: 141 mmol/L (ref 135–145)

## 2018-02-16 LAB — CBC
HEMATOCRIT: 32.7 % — AB (ref 40.0–52.0)
Hemoglobin: 11.3 g/dL — ABNORMAL LOW (ref 13.0–18.0)
MCH: 30.9 pg (ref 26.0–34.0)
MCHC: 34.7 g/dL (ref 32.0–36.0)
MCV: 89.1 fL (ref 80.0–100.0)
Platelets: 133 10*3/uL — ABNORMAL LOW (ref 150–440)
RBC: 3.67 MIL/uL — ABNORMAL LOW (ref 4.40–5.90)
RDW: 17.9 % — AB (ref 11.5–14.5)
WBC: 6.4 10*3/uL (ref 3.8–10.6)

## 2018-02-16 LAB — GLUCOSE, CAPILLARY
GLUCOSE-CAPILLARY: 99 mg/dL (ref 70–99)
GLUCOSE-CAPILLARY: 139 mg/dL — AB (ref 70–99)
Glucose-Capillary: 98 mg/dL (ref 70–99)
Glucose-Capillary: 96 mg/dL (ref 70–99)

## 2018-02-16 LAB — PROCALCITONIN

## 2018-02-16 MED ORDER — EPOETIN ALFA 10000 UNIT/ML IJ SOLN: 4000.0000 [IU] | INTRAMUSCULAR | Status: DC

## 2018-02-16 MED ORDER — CHLORHEXIDINE GLUCONATE CLOTH 2 % EX PADS
6.0000 | MEDICATED_PAD | Freq: Every day | CUTANEOUS | Status: DC
  Administered 2018-02-16 – 2018-02-17 (×2): 6 via TOPICAL

## 2018-02-16 MED ORDER — HEPARIN SODIUM (PORCINE) 1000 UNIT/ML DIALYSIS
20.0000 [IU]/kg | INTRAMUSCULAR | Status: DC | PRN
  Filled 2018-02-16: qty 2

## 2018-02-16 NOTE — Progress Notes (Signed)
Post HD assessment   02/16/18 1640  Neurological  Level of Consciousness Alert  Orientation Level Oriented X4  Respiratory  Respiratory Pattern Regular;Unlabored  Chest Assessment Chest expansion symmetrical  Cardiac  Pulse Irregular  ECG Monitor Yes  Cardiac Rhythm Atrial fibrillation  Vascular  R Radial Pulse +2  L Radial Pulse +2  Edema Generalized  Integumentary  Integumentary (WDL) X  Skin Color Appropriate for ethnicity  Musculoskeletal  Musculoskeletal (WDL) X  Generalized Weakness Yes  Assistive Device None  GU Assessment  Genitourinary (WDL) X  Genitourinary Symptoms  (HD)  Psychosocial  Psychosocial (WDL) WDL

## 2018-02-16 NOTE — Progress Notes (Signed)
[place] at [facility]                                                                                                                                                                                  Patient Demographics   Matthew [name], is a 84 y.o. male, DOB - 1931/01/08, SFK:[id number]  Admit date - 02/15/2018   Admitting Physician Saundra Shelling, MD  Outpatient Primary MD for the patient is Jodi Marble, MD   LOS - 1  Subjective: Patient admitted with hypoxia thought to have possible pneumonia according to his wife.  Patient he has not been very ambulatory. She denies any cough shortness of breath or fevers   Review of Systems:   CONSTITUTIONAL: No documented fever. No fatigue, weakness. No weight gain, no weight loss.  EYES: No blurry or double vision.  ENT: No tinnitus. No postnasal drip. No redness of the oropharynx.  RESPIRATORY: No cough, no wheeze, no hemoptysis. No dyspnea.  CARDIOVASCULAR: No chest pain. No orthopnea. No palpitations. No syncope.  GASTROINTESTINAL: No nausea, no vomiting or diarrhea. No abdominal pain. No melena or hematochezia.  GENITOURINARY: No dysuria or hematuria.  ENDOCRINE: No polyuria or nocturia. No heat or cold intolerance.  HEMATOLOGY: No anemia. No bruising. No bleeding.  INTEGUMENTARY: No rashes. No lesions.  MUSCULOSKELETAL: No arthritis. No swelling. No gout.  NEUROLOGIC: No numbness, tingling, or ataxia. No seizure-type activity.  PSYCHIATRIC: No anxiety. No insomnia. No ADD.    Vitals:   Vitals:   02/15/18 2054 02/16/18 0257 02/16/18 0858 02/16/18 1234  BP:  126/87  123/70  Pulse:  83  70  Resp:  20  16  Temp:  98 F (36.7 C)  97.7 F (36.5 C)  TempSrc:  Oral  Oral  SpO2: 98% 97% 98% 100%  Weight:  92.5 kg    Height:        Wt Readings from Last 3 Encounters:  02/16/18 92.5 kg  01/20/18 111.1 kg  01/15/18 98.6 kg     Intake/Output Summary (Last 24 hours) at 02/16/2018 1331 Last data  filed at 02/16/2018 0900 Gross per 24 hour  Intake 1363 ml  Output 450 ml  Net 913 ml    Physical Exam:   GENERAL: Pleasant-appearing in no apparent distress.  HEAD, EYES, EARS, NOSE AND THROAT: Atraumatic, normocephalic. Extraocular muscles are intact. Pupils equal and reactive to light. Sclerae anicteric. No conjunctival injection. No oro-pharyngeal erythema.  NECK: Supple. There is no jugular venous distention. No bruits, no lymphadenopathy, no thyromegaly.  HEART: Regular rate and rhythm,. No murmurs, no rubs, no clicks.  LUNGS: Clear to auscultation bilaterally. No rales or rhonchi. No wheezes.  ABDOMEN: Soft, flat, nontender, nondistended. Has good bowel sounds. No hepatosplenomegaly appreciated.  EXTREMITIES: No evidence of any cyanosis, clubbing, or peripheral edema.  +2 pedal and radial pulses bilaterally.  NEUROLOGIC: The patient is alert, awake, and oriented x3 with no focal motor or sensory deficits appreciated bilaterally.  SKIN: Moist and warm with no rashes appreciated.  Psych: Not anxious, depressed LN: No inguinal LN enlargement    Antibiotics   Anti-infectives (From admission, onward)   Start     Dose/Rate Route Frequency Ordered Stop   02/16/18 1800  ceFEPIme (MAXIPIME) 1 g in sodium chloride 0.9 % 100 mL IVPB     1 g 200 mL/hr over 30 Minutes Intravenous Every 24 hours 02/15/18 1454     02/16/18 1200  vancomycin (VANCOCIN) IVPB 1000 mg/200 mL premix     1,000 mg 200 mL/hr over 60 Minutes Intravenous Every T-Th-Sa (Hemodialysis) 02/15/18 1455     02/15/18 1500  vancomycin (VANCOCIN) IVPB 1000 mg/200 mL premix     1,000 mg 200 mL/hr over 60 Minutes Intravenous  Once 02/15/18 1446 02/15/18 1556   02/15/18 1200  vancomycin (VANCOCIN) IVPB 1000 mg/200 mL premix     1,000 mg 200 mL/hr over 60 Minutes Intravenous  Once 02/15/18 1147 02/15/18 1414   02/15/18 1200  ceFEPIme (MAXIPIME) 1 g in sodium chloride 0.9 % 100 mL IVPB     1 g 200 mL/hr over 30 Minutes  Intravenous  Once 02/15/18 1147 02/15/18 1348      Medications   Scheduled Meds: . amiodarone  200 mg Oral Daily  . aspirin EC  81 mg Oral QHS  . atorvastatin  40 mg Oral QHS  . budesonide (PULMICORT) nebulizer solution  0.25 mg Nebulization BID  . Chlorhexidine Gluconate Cloth  6 each Topical Q0600  . cholecalciferol  2,000 Units Oral Q breakfast  . clopidogrel  75 mg Oral Q breakfast  . [START ON 02/18/2018] epoetin (EPOGEN/PROCRIT) injection  4,000 Units Intravenous Q T,Th,Sa-HD  . fenofibrate  160 mg Oral Daily  . heparin  5,000 Units Subcutaneous Q8H  . insulin aspart  0-15 Units Subcutaneous TID WC  . insulin aspart  0-5 Units Subcutaneous QHS  . multivitamin with minerals  1 tablet Oral Q breakfast  . omega-3 acid ethyl esters  4,000 mg Oral Q1200  . sodium chloride flush  3 mL Intravenous Q12H   Continuous Infusions: . sodium chloride    . ceFEPime (MAXIPIME) IV    . vancomycin     PRN Meds:.sodium chloride, acetaminophen **OR** acetaminophen, albuterol, heparin, nitroGLYCERIN, ondansetron **OR** ondansetron (ZOFRAN) IV, senna-docusate, sodium chloride flush, zolpidem   Data Review:   Micro Results Recent Results (from the past 240 hour(s))  Blood culture (routine x 2)     Status: None (Preliminary result)   Collection Time: 02/15/18 12:58 PM  Result Value Ref Range Status   Specimen Description BLOOD LEFT AC  Final   Special Requests   Final    BOTTLES DRAWN AEROBIC AND ANAEROBIC Blood Culture adequate volume   Culture   Final    NO GROWTH < 24 HOURS Performed at [facility], [address] [place], [place] [address]    Report Status PENDING  Incomplete  Blood culture (routine x 2)     Status: None (Preliminary result)   Collection Time: 02/15/18 12:58 PM  Result Value Ref Range Status   Specimen Description BLOOD RIGHT HAND  Final   Special Requests   Final    BOTTLES  DRAWN AEROBIC AND ANAEROBIC Blood Culture results may not be optimal due to an  excessive volume of blood received in culture bottles   Culture   Final    NO GROWTH < 24 HOURS Performed at [facility], [address] [place], [place] [address]    Report Status PENDING  Incomplete  MRSA PCR Screening     Status: None   Collection Time: 02/15/18  8:34 PM  Result Value Ref Range Status   MRSA by PCR NEGATIVE NEGATIVE Final    Comment:        The GeneXpert MRSA Assay (FDA approved for NASAL specimens only), is one component of a comprehensive MRSA colonization surveillance program. It is not intended to diagnose MRSA infection nor to guide or monitor treatment for MRSA infections. Performed at [facility], [address] [place], [place] [address]     Radiology Reports Dg Chest 2 View  Result Date: 02/15/2018 CLINICAL DATA:  Dizziness, lightheadedness and weakness since this morning at 7 a.m. EXAM: CHEST - 2 VIEW COMPARISON:  01/10/2018. FINDINGS: Interval right jugular catheter with its tip in the superior vena cava near the cavoatrial junction. Stable enlarged cardiac silhouette and tortuous aorta. Stable right coronary artery stent. Interval mild patchy opacity in the left lower lobe. Remainder of the lungs are clear. Thoracic spine degenerative changes. IMPRESSION: 1. Interval mild patchy atelectasis or pneumonia in the left lower lobe. 2. Stable cardiomegaly. Electronically Signed   By: Claudie Revering M.D.   On: 02/15/2018 11:39   Ct Head Wo Contrast  Result Date: 02/15/2018 CLINICAL DATA:  Dizziness, lightheadedness, weakness EXAM: CT HEAD WITHOUT CONTRAST TECHNIQUE: Contiguous axial images were obtained from the base of the skull through the vertex without intravenous contrast. COMPARISON:  01/18/2018 FINDINGS: Brain: No evidence of acute infarction, hemorrhage, extra-axial collection, ventriculomegaly, or mass effect. Cystic mass in the right middle cranial fossa most consistent with an arachnoid cyst generalized cerebral atrophy.  Periventricular white matter low attenuation likely secondary to microangiopathy. Vascular: Cerebrovascular atherosclerotic calcifications are noted. Skull: Negative for fracture or focal lesion. Sinuses/Orbits: Visualized portions of the orbits are unremarkable. Visualized portions of the paranasal sinuses and mastoid air cells are unremarkable. Other: None. IMPRESSION: No acute intracranial pathology. Electronically Signed   By: Kathreen Devoid   On: 02/15/2018 11:29   Ct Head Wo Contrast  Result Date: 01/18/2018 CLINICAL DATA:  84 year old fall while with at home an unwitnessed, found face down on the floor. Initial encounter. EXAM: CT HEAD WITHOUT CONTRAST CT CERVICAL SPINE WITHOUT CONTRAST TECHNIQUE: Multidetector CT imaging of the head and cervical spine was performed following the standard protocol without intravenous contrast. Multiplanar CT image reconstructions of the cervical spine were also generated. COMPARISON:  None. FINDINGS: CT HEAD FINDINGS Brain: Severe age related cortical atrophy, moderate deep atrophy and mild cerebellar atrophy. Arachnoid cyst involving the RIGHT MIDDLE cranial fossa. Mild to moderate changes of small vessel disease of the white matter. No mass lesion. No midline shift. No acute hemorrhage or hematoma. No extra-axial fluid collections. No evidence of acute infarction. Vascular: Moderate BILATERAL carotid siphon and BILATERAL vertebral artery atherosclerosis. Extensive intracranial atherosclerosis. No hyperdense vessel. Skull: No skull fracture or other focal osseous abnormality involving the skull. Sinuses/Orbits: Mucous retention cyst or polyp in the LEFT maxillary sinus and in a LEFT POSTERIOR ethmoid air cell. Remaining visualized paranasal sinuses, BILATERAL mastoid air cells and BILATERAL middle ear cavities well-aerated. Visualized orbits and globes intact. Other: None. CT CERVICAL SPINE FINDINGS Alignment:  Anatomic POSTERIOR alignment. Straightening of the usual  cervical lordosis. Skull base and vertebrae: No fractures identified involving the cervical spine. Facet joints intact with degenerative changes throughout. Coronal reformatted images demonstrate an intact craniocervical junction, intact dens and intact lateral masses throughout. Soft tissues and spinal canal: No paraspinal or canal hematoma. Moderate spinal stenosis at C4-5 and C5-6 and mild spinal stenosis at C6-7. Disc levels: Acquired fusion of the C2 and C3 vertebral bodies with congenital fusion of the vertebral bodies and POSTERIOR elements of C3 and C4. 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 Disc space narrowing and endplate hypertrophic changes at every level from C4-5 through C7-T1. Facet and uncinate hypertrophy account for multilevel foraminal stenoses including severe RIGHT and mild LEFT C2-3, severe LEFT and moderate RIGHT C4-5, severe BILATERAL C6-7. Upper chest: Visualized lung apices clear. Visualized superior mediastinum normal. Other: None. IMPRESSION: CT Head: 1. No acute intracranial abnormality. 2. Severe age related cortical atrophy, moderate deep atrophy and mild cerebellar atrophy. Mild to moderate chronic microvascular ischemic changes of the white matter. 3. Benign arachnoid cyst involving the RIGHT MIDDLE cranial fossa. CT Cervical Spine: 1. No cervical spine fractures identified. 2. Multilevel degenerative disc disease, spondylosis and facet degenerative changes with multilevel foraminal stenoses as detailed above. 3. Moderate multifactorial spinal stenosis at C4-5 and C5-6 and mild multifactorial spinal stenosis at C6-7. Electronically Signed   By: Evangeline Dakin M.D.   On: 01/18/2018 19:05   Ct Cervical Spine Wo Contrast  Result Date: 01/18/2018 CLINICAL DATA:  84 year old fall while with at home an unwitnessed, found face down on the floor. Initial encounter. EXAM: CT HEAD WITHOUT  CONTRAST CT CERVICAL SPINE WITHOUT CONTRAST TECHNIQUE: Multidetector CT imaging of the head and cervical spine was performed following the standard protocol without intravenous contrast. Multiplanar CT image reconstructions of the cervical spine were also generated. COMPARISON:  None. FINDINGS: CT HEAD FINDINGS Brain: Severe age related cortical atrophy, moderate deep atrophy and mild cerebellar atrophy. Arachnoid cyst involving the RIGHT MIDDLE cranial fossa. Mild to moderate changes of small vessel disease of the white matter. No mass lesion. No midline shift. No acute hemorrhage or hematoma. No extra-axial fluid collections. No evidence of acute infarction. Vascular: Moderate BILATERAL carotid siphon and BILATERAL vertebral artery atherosclerosis. Extensive intracranial atherosclerosis. No hyperdense vessel. Skull: No skull fracture or other focal osseous abnormality involving the skull. Sinuses/Orbits: Mucous retention cyst or polyp in the LEFT maxillary sinus and in a LEFT POSTERIOR ethmoid air cell. Remaining visualized paranasal sinuses, BILATERAL mastoid air cells and BILATERAL middle ear cavities well-aerated. Visualized orbits and globes intact. Other: None. CT CERVICAL SPINE FINDINGS Alignment: Anatomic POSTERIOR alignment. Straightening of the usual cervical lordosis. Skull base and vertebrae: No fractures identified involving the cervical spine. Facet joints intact with degenerative changes throughout. Coronal reformatted images demonstrate an intact craniocervical junction, intact dens and intact lateral masses throughout. Soft tissues and spinal canal: No paraspinal or canal hematoma. Moderate spinal stenosis at C4-5 and C5-6 and mild spinal stenosis at C6-7. Disc levels: Acquired fusion of the C2 and C3 vertebral bodies with congenital fusion of the vertebral bodies and POSTERIOR elements of C3 and C4. Large bridging ANTERIOR osteophytes with ossification in the ANTERIOR longitudinal ligament from C3  through the UPPER endplate of C7. Disc space narrowing and endplate hypertrophic changes at every level from C4-5 through C7-T1. Facet and uncinate hypertrophy account for multilevel foraminal stenoses including severe RIGHT and  mild LEFT C2-3, severe LEFT and moderate RIGHT C4-5, severe BILATERAL C6-7. Upper chest: Visualized lung apices clear. Visualized superior mediastinum normal. Other: None. IMPRESSION: CT Head: 1. No acute intracranial abnormality. 2. Severe age related cortical atrophy, moderate deep atrophy and mild cerebellar atrophy. Mild to moderate chronic microvascular ischemic changes of the white matter. 3. Benign arachnoid cyst involving the RIGHT MIDDLE cranial fossa. CT Cervical Spine: 1. No cervical spine fractures identified. 2. Multilevel degenerative disc disease, spondylosis and facet degenerative changes with multilevel foraminal stenoses as detailed above. 3. Moderate multifactorial spinal stenosis at C4-5 and C5-6 and mild multifactorial spinal stenosis at C6-7. Electronically Signed   By: Evangeline Dakin M.D.   On: 01/18/2018 19:05     CBC Recent Labs  Lab 02/15/18 1039 02/16/18 0402  WBC 5.5 6.4  HGB 11.9* 11.3*  HCT 34.3* 32.7*  PLT 129* 133*  MCV 89.5 89.1  MCH 31.0 30.9  MCHC 34.6 34.7  RDW 17.9* 17.9*  LYMPHSABS 0.7*  --   MONOABS 0.8  --   EOSABS 0.2  --   BASOSABS 0.1  --     Chemistries  Recent Labs  Lab 02/15/18 1039 02/16/18 0402  NA 135 141  K 3.5 3.9  CL 98 103  CO2 24 30  GLUCOSE 149* 123*  BUN 39* 39*  CREATININE 3.39* 3.90*  CALCIUM 9.1 9.3  AST 42*  --   ALT 21  --   ALKPHOS 42  --   BILITOT 1.4*  --    ------------------------------------------------------------------------------------------------------------------ estimated creatinine clearance is 15.2 mL/min (A) (by C-G formula based on SCr of 3.9 mg/dL (H)). ------------------------------------------------------------------------------------------------------------------ No  results for input(s): HGBA1C in the last 72 hours. ------------------------------------------------------------------------------------------------------------------ No results for input(s): CHOL, HDL, LDLCALC, TRIG, CHOLHDL, LDLDIRECT in the last 72 hours. ------------------------------------------------------------------------------------------------------------------ No results for input(s): TSH, T4TOTAL, T3FREE, THYROIDAB in the last 72 hours.  Invalid input(s): FREET3 ------------------------------------------------------------------------------------------------------------------ No results for input(s): VITAMINB12, FOLATE, FERRITIN, TIBC, IRON, RETICCTPCT in the last 72 hours.  Coagulation profile No results for input(s): INR, PROTIME in the last 168 hours.  No results for input(s): DDIMER in the last 72 hours.  Cardiac Enzymes Recent Labs  Lab 02/15/18 1039  TROPONINI <0.03   ------------------------------------------------------------------------------------------------------------------ Invalid input(s): POCBNP    Assessment & Plan   84 year old male patient with history of end-stage renal disease on dialysis, chronic congestive heart failure, diabetes mellitus type 2, hypertension, coronary artery disease presented to the emergency room for weakness, dizziness  -Sepsis ruled out   -Hypotension secondary to sepsis/orthostatic Blood pressure stable   -Left lung atelectasis  Use incentive spirometry as needed   -End-stage renal disease Dialysis Tuesday Thursday and Saturday Nephrology consultation  -Type 2 diabetes mellitus Diabetic diet with sliding scale coverage with insulin  -DVT prophylaxis subcu heparin)      Code Status Orders  (From admission, onward)         Start     Ordered   02/15/18 1910  Do not attempt resuscitation (DNR)  Continuous    Question Answer Comment  In the event of cardiac or respiratory ARREST Do not call a "code  blue"   In the event of cardiac or respiratory ARREST Do not perform Intubation, CPR, defibrillation or ACLS   In the event of cardiac or respiratory ARREST Use medication by any route, position, wound care, and other measures to relive pain and suffering. May use oxygen, suction and manual treatment of airway obstruction as needed for comfort.   Comments nurse  may pronounce      02/15/18 1909        Code Status History    Date Active Date Inactive Code Status Order ID Comments User Context   01/19/2018 0104 01/20/2018 1812 DNR [id number]  Amelia Jo, MD Inpatient   01/10/2018 1248 01/15/2018 1546 DNR [id number]  Loletha Grayer, MD Inpatient   01/10/2018 1224 01/10/2018 1248 Full Code [id number]  Loletha Grayer, MD Inpatient   12/09/2017 1835 12/11/2017 1508 Full Code [id number]  Loletha Grayer, MD Inpatient   12/06/2013 1125 12/07/2013 1536 Full Code [id number]  Belva Crome, MD Inpatient   12/04/2013 0955 12/06/2013 1125 Full Code [id number]  Leonie Man, MD Inpatient   12/03/2013 0407 12/04/2013 0955 Full Code [id number]  Rise Patience, MD Inpatient    Advance Directive Documentation     Most Recent Value  Type of Advance Directive  Healthcare Power of [place], Living will [daughter Bonnie]  Pre-existing out of facility DNR order (yellow form or pink MOST form)  -  "MOST" Form in Place?  -           Consults nephrology DVT Prophylaxis heparin  Lab Results  Component Value Date   PLT 133 (L) 02/16/2018     Time Spent in minutes 35 minutes greater than 50% of time spent in care coordination and counseling patient regarding the condition and plan of care.   Dustin Flock M.D on 02/16/2018 at 1:31 PM  Between 7am to 6pm - Pager - [phone]  After 6pm go to [email or web address] - Proofreader  [employer]   Office  [phone]

## 2018-02-16 NOTE — Progress Notes (Signed)
Pre HD assessment    02/16/18 1318  Neurological  Level of Consciousness Alert  Orientation Level Oriented X4  Respiratory  Respiratory Pattern Regular;Unlabored  Chest Assessment Chest expansion symmetrical  Cardiac  Pulse Irregular  ECG Monitor Yes  Cardiac Rhythm Atrial fibrillation  Vascular  R Radial Pulse +2  L Radial Pulse +2  Integumentary  Integumentary (WDL) X  Skin Color Appropriate for ethnicity  Musculoskeletal  Musculoskeletal (WDL) X  Generalized Weakness Yes  Assistive Device None  GU Assessment  Genitourinary (WDL) X  Genitourinary Symptoms  (HD)  Psychosocial  Psychosocial (WDL) WDL

## 2018-02-16 NOTE — Progress Notes (Signed)
[facility], [place] 02/16/18  Subjective:   Patient known to our practice from outpatient dialysis He currently dialyzes at [facility] dialysis center He reports that he recently started dialysis and has had 3 treatments.  At home he was noted to have low oxygen saturation of 72% and bradycardia with pulse of 44 by his home health nurse.  He was then referred to the emergency room for evaluation.  He states his blood sugar was good.  No cough, fever, shortness of breath Nephrology consult requested to arrange for dialysis  Objective:  Vital signs in last 24 hours:  Temp:  [97.7 F (36.5 C)-98.3 F (36.8 C)] 97.7 F (36.5 C) (09/26 1234) Pulse Rate:  [70-112] 70 (09/26 1234) Resp:  [16-33] 16 (09/26 1234) BP: (108-131)/(56-87) 123/70 (09/26 1234) SpO2:  [96 %-100 %] 100 % (09/26 1234) Weight:  [92.5 kg] 92.5 kg (09/26 0257)  Weight change:  Filed Weights   02/15/18 1028 02/16/18 0257  Weight: 89.4 kg 92.5 kg    Intake/Output:    Intake/Output Summary (Last 24 hours) at 02/16/2018 1301 Last data filed at 02/16/2018 0900 Gross per 24 hour  Intake 1863 ml  Output 450 ml  Net 1413 ml     Physical Exam: General:  Laying in the bed, no acute distress  HEENT  anicteric, moist oral mucous membranes  Neck  supple, no masses  Pulm/lungs  normal breathing effort, room air  CVS/Heart  irregular rhythm  Abdomen:   Soft, nontender  Extremities:  No peripheral edema  Neurologic:  Alert, oriented  Skin:  No acute rashes  Access:  Right IJ PermCath       Basic Metabolic Panel:  Recent Labs  Lab 02/15/18 1039 02/16/18 0402  NA 135 141  K 3.5 3.9  CL 98 103  CO2 24 30  GLUCOSE 149* 123*  BUN 39* 39*  CREATININE 3.39* 3.90*  CALCIUM 9.1 9.3     CBC: Recent Labs  Lab 02/15/18 1039 02/16/18 0402  WBC 5.5 6.4  NEUTROABS 3.8  --   HGB 11.9* 11.3*  HCT 34.3* 32.7*  MCV 89.5 89.1  PLT 129* 133*     No results found for: HEPBSAG,  HEPBSAB, HEPBIGM    Microbiology:  Recent Results (from the past 240 hour(s))  Blood culture (routine x 2)     Status: None (Preliminary result)   Collection Time: 02/15/18 12:58 PM  Result Value Ref Range Status   Specimen Description BLOOD LEFT AC  Final   Special Requests   Final    BOTTLES DRAWN AEROBIC AND ANAEROBIC Blood Culture adequate volume   Culture   Final    NO GROWTH < 24 HOURS Performed at [facility], [address] [place], [place] [address]    Report Status PENDING  Incomplete  Blood culture (routine x 2)     Status: None (Preliminary result)   Collection Time: 02/15/18 12:58 PM  Result Value Ref Range Status   Specimen Description BLOOD RIGHT HAND  Final   Special Requests   Final    BOTTLES DRAWN AEROBIC AND ANAEROBIC Blood Culture results may not be optimal due to an excessive volume of blood received in culture bottles   Culture   Final    NO GROWTH < 24 HOURS Performed at [facility], [address] [place], [place] [address]    Report Status PENDING  Incomplete  MRSA PCR Screening     Status: None   Collection  Time: 02/15/18  8:34 PM  Result Value Ref Range Status   MRSA by PCR NEGATIVE NEGATIVE Final    Comment:        The GeneXpert MRSA Assay (FDA approved for NASAL specimens only), is one component of a comprehensive MRSA colonization surveillance program. It is not intended to diagnose MRSA infection nor to guide or monitor treatment for MRSA infections. Performed at [facility], [address] [place], [place] [address]     Coagulation Studies: No results for input(s): LABPROT, INR in the last 72 hours.  Urinalysis: No results for input(s): COLORURINE, LABSPEC, PHURINE, GLUCOSEU, HGBUR, BILIRUBINUR, KETONESUR, PROTEINUR, UROBILINOGEN, NITRITE, LEUKOCYTESUR in the last 72 hours.  Invalid input(s): APPERANCEUR    Imaging: Dg Chest 2 View  Result Date: 02/15/2018 CLINICAL DATA:  Dizziness,  lightheadedness and weakness since this morning at 7 a.m. EXAM: CHEST - 2 VIEW COMPARISON:  01/10/2018. FINDINGS: Interval right jugular catheter with its tip in the superior vena cava near the cavoatrial junction. Stable enlarged cardiac silhouette and tortuous aorta. Stable right coronary artery stent. Interval mild patchy opacity in the left lower lobe. Remainder of the lungs are clear. Thoracic spine degenerative changes. IMPRESSION: 1. Interval mild patchy atelectasis or pneumonia in the left lower lobe. 2. Stable cardiomegaly. Electronically Signed   By: Claudie Revering M.D.   On: 02/15/2018 11:39   Ct Head Wo Contrast  Result Date: 02/15/2018 CLINICAL DATA:  Dizziness, lightheadedness, weakness EXAM: CT HEAD WITHOUT CONTRAST TECHNIQUE: Contiguous axial images were obtained from the base of the skull through the vertex without intravenous contrast. COMPARISON:  01/18/2018 FINDINGS: Brain: No evidence of acute infarction, hemorrhage, extra-axial collection, ventriculomegaly, or mass effect. Cystic mass in the right middle cranial fossa most consistent with an arachnoid cyst generalized cerebral atrophy. Periventricular white matter low attenuation likely secondary to microangiopathy. Vascular: Cerebrovascular atherosclerotic calcifications are noted. Skull: Negative for fracture or focal lesion. Sinuses/Orbits: Visualized portions of the orbits are unremarkable. Visualized portions of the paranasal sinuses and mastoid air cells are unremarkable. Other: None. IMPRESSION: No acute intracranial pathology. Electronically Signed   By: Kathreen Devoid   On: 02/15/2018 11:29     Medications:   . sodium chloride    . ceFEPime (MAXIPIME) IV    . vancomycin     . amiodarone  200 mg Oral Daily  . aspirin EC  81 mg Oral QHS  . atorvastatin  40 mg Oral QHS  . budesonide (PULMICORT) nebulizer solution  0.25 mg Nebulization BID  . Chlorhexidine Gluconate Cloth  6 each Topical Q0600  . cholecalciferol  2,000 Units  Oral Q breakfast  . clopidogrel  75 mg Oral Q breakfast  . fenofibrate  160 mg Oral Daily  . heparin  5,000 Units Subcutaneous Q8H  . insulin aspart  0-15 Units Subcutaneous TID WC  . insulin aspart  0-5 Units Subcutaneous QHS  . multivitamin with minerals  1 tablet Oral Q breakfast  . omega-3 acid ethyl esters  4,000 mg Oral Q1200  . sodium chloride flush  3 mL Intravenous Q12H   sodium chloride, acetaminophen **OR** acetaminophen, albuterol, heparin, nitroGLYCERIN, ondansetron **OR** ondansetron (ZOFRAN) IV, senna-docusate, sodium chloride flush, zolpidem  Assessment/ Plan:  84 y.o.caucasian male with diabetes, coronary disease, end-stage renal disease presents for evaluation of hypoxia, bradycardia, dizziness and lightheadedness [facility]/ [address] [facility]/TTS/93 kg  1.  End-stage renal disease Patient is under his dry weight.  No fluid to be removed with hemodialysis today.  Orders prepared for today  2.  Anemia of chronic kidney disease.  Continue Epogen with hemodialysis Recent hemoglobin 11.3  3.  Secondary hyperparathyroidism Monitor phosphorus during hospital stay  4.  Hypoxia Empirically being treated for pneumonia with IV antibiotics   LOS: 1 Sherre Wooton 9/26/20191:01 PM  [place], [place]  Note: This note was prepared with Dragon dictation. Any transcription errors are unintentional

## 2018-02-16 NOTE — Progress Notes (Signed)
Post HD assessment. Pt tolerated tx well without c/o or complication. CVC perm cath dressing changed. Net UF 77m, goal met.    02/16/18 1640  Vital Signs  Temp 98.3 F (36.8 C)  Temp Source Oral  Pulse Rate 67  Pulse Rate Source Monitor  Resp 15  BP (!) 120/54  BP Location Left Arm  BP Method Automatic  Patient Position (if appropriate) Lying  Oxygen Therapy  SpO2 100 %  O2 Device Room Air  Dialysis Weight  Weight 95.3 kg  Type of Weight Post-Dialysis  Post-Hemodialysis Assessment  Rinseback Volume (mL) 250 mL  KECN 51.6 V  Dialyzer Clearance Lightly streaked  Duration of HD Treatment -hour(s) 3 hour(s)  Hemodialysis Intake (mL) 700 mL  UF Total -Machine (mL) 708 mL  Net UF (mL) 8 mL  Tolerated HD Treatment Yes  Education / Care Plan  Dialysis Education Provided Yes  Documented Education in Care Plan Yes

## 2018-02-16 NOTE — Progress Notes (Signed)
Pre HD assessment    02/16/18 1317  Vital Signs  Temp 97.9 F (36.6 C)  Temp Source Oral  Pulse Rate 88  Pulse Rate Source Monitor  Resp 19  BP 117/60  BP Location Left Arm  BP Method Automatic  Patient Position (if appropriate) Lying  Oxygen Therapy  SpO2 100 %  O2 Device Room Air  Pain Assessment  Pain Scale 0-10  Pain Score 0  Dialysis Weight  Weight 95.5 kg  Type of Weight Pre-Dialysis  Time-Out for Hemodialysis  What Procedure? HD  Pt Identifiers(min of two) First/Last Name;MRN/Account#  Correct Site? Yes  Correct Side? Yes  Correct Procedure? Yes  Consents Verified? Yes  Rad Studies Available? N/A  Safety Precautions Reviewed? Yes  Engineer, civil (consulting) Number  (1A)  Station Number 4  UF/Alarm Test Passed  Conductivity: Meter 13.8  Conductivity: Machine  13.9  pH 7.4  Reverse Osmosis main  Normal Saline Lot Number [id number]  Dialyzer Lot Number [id number]  Disposable Set Lot Number [id number]  Machine Temperature 98.6 F (37 C)  Musician and Audible Yes  Blood Lines Intact and Secured Yes  Pre Treatment Patient Checks  Vascular access used during treatment Catheter  Hepatitis B Surface Antigen Results Negative  Date Hepatitis B Surface Antigen Drawn 02/10/18  Hepatitis B Surface Antibody  (<10)  Date Hepatitis B Surface Antibody Drawn 02/10/18  Hemodialysis Consent Verified Yes  Hemodialysis Standing Orders Initiated Yes  ECG (Telemetry) Monitor On Yes  Prime Ordered Normal Saline  Length of  DialysisTreatment -hour(s) 3 Hour(s)  Dialyzer Elisio 17H NR  Dialysate 3K, 2.5 Ca  Dialysis Anticoagulant None  Dialysate Flow Ordered 600  Blood Flow Rate Ordered 300 mL/min  Ultrafiltration Goal 0 Liters  Pre Treatment Labs  (Precalcitonin, Vancomycin random )  Dialysis Blood Pressure Support Ordered Normal Saline  Education / Care Plan  Dialysis Education Provided Yes  Documented Education in Care Plan Yes

## 2018-02-16 NOTE — Progress Notes (Signed)
HD tx start    02/16/18 1324  Vital Signs  Pulse Rate 77  Pulse Rate Source Monitor  Resp 19  BP 115/63  BP Location Left Arm  BP Method Automatic  Patient Position (if appropriate) Lying  Oxygen Therapy  SpO2 100 %  O2 Device Room Air  During Hemodialysis Assessment  Blood Flow Rate (mL/min) 300 mL/min  Arterial Pressure (mmHg) -100 mmHg  Venous Pressure (mmHg) 80 mmHg  Transmembrane Pressure (mmHg) 50 mmHg  Ultrafiltration Rate (mL/min) 170 mL/min  Dialysate Flow Rate (mL/min) 600 ml/min  Conductivity: Machine  13.9  HD Safety Checks Performed Yes  Dialysis Fluid Bolus Normal Saline  Bolus Amount (mL) 250 mL  Intra-Hemodialysis Comments Tx initiated

## 2018-02-16 NOTE — Progress Notes (Signed)
HD tx end   02/16/18 1630  Vital Signs  Pulse Rate 92  Pulse Rate Source Monitor  Resp 16  BP 126/78  BP Location Left Arm  BP Method Automatic  Patient Position (if appropriate) Lying  Oxygen Therapy  SpO2 100 %  O2 Device Room Air  During Hemodialysis Assessment  Dialysis Fluid Bolus Normal Saline  Bolus Amount (mL) 250 mL  Intra-Hemodialysis Comments Tx completed

## 2018-02-16 NOTE — Progress Notes (Signed)
1st attempt for report pre HD tx, RN unavailable to give report.    02/16/18 1236  Hand-Off documentation  Report given to (Full Name) Stark Bray  Report received from (Full Name) Earl Gala

## 2018-02-16 NOTE — Care Management (Addendum)
Matthew [name] dialysis liaison notified of admission.     Patient admitted from home with sepsis.  Patient lives at home with wife.  PCP Tejansie.  Family provides transportation. Patient open with [facility] home health for RN.  [name] with [facility] notified of admission. Patient ambulates with a RW at baseline

## 2018-02-17 ENCOUNTER — Encounter (INDEPENDENT_AMBULATORY_CARE_PROVIDER_SITE_OTHER): Payer: Medicare Other | Admitting: Vascular Surgery

## 2018-02-17 DIAGNOSIS — N185 Chronic kidney disease, stage 5: Secondary | ICD-10-CM | POA: Diagnosis not present

## 2018-02-17 DIAGNOSIS — E785 Hyperlipidemia, unspecified: Secondary | ICD-10-CM | POA: Diagnosis not present

## 2018-02-17 DIAGNOSIS — I4891 Unspecified atrial fibrillation: Secondary | ICD-10-CM | POA: Diagnosis not present

## 2018-02-17 DIAGNOSIS — I10 Essential (primary) hypertension: Secondary | ICD-10-CM | POA: Diagnosis not present

## 2018-02-17 DIAGNOSIS — I351 Nonrheumatic aortic (valve) insufficiency: Secondary | ICD-10-CM | POA: Diagnosis not present

## 2018-02-17 DIAGNOSIS — I2510 Atherosclerotic heart disease of native coronary artery without angina pectoris: Secondary | ICD-10-CM | POA: Diagnosis not present

## 2018-02-17 LAB — GLUCOSE, CAPILLARY: Glucose-Capillary: 79 mg/dL (ref 70–99)

## 2018-02-17 MED ORDER — LEVOFLOXACIN 500 MG PO TABS: 500.0000 mg | ORAL_TABLET | ORAL | 0 refills | Status: AC

## 2018-02-17 NOTE — Care Management Note (Addendum)
Case Management Note  Patient Details  Name: Emileo Semel MRN: [id number] Date of Birth: 19-Oct-1930   Patient to discharge today.  Cheryl with [facility] notified.  Elvera Bicker dialysis liaison notified of discharge.  Wife to transport at discharge   Subjective/Objective:                    Action/Plan:   Expected Discharge Date:  02/17/18               Expected Discharge Plan:  [place]  In-House Referral:     Discharge planning Services  CM Consult  Post Acute Care Choice:  Resumption of Svcs/PTA Provider Choice offered to:     DME Arranged:    DME Agency:     HH Arranged:  RN, PT, Nurse's Aide [place] Agency:  [place]  Status of Service:  Completed, signed off  If discussed at [place] of Stay Meetings, dates discussed:    Additional Comments:  Beverly Sessions, RN 02/17/2018, 11:26 AM

## 2018-02-17 NOTE — Discharge Summary (Signed)
[employer] - [place] [facility], 84 y.o., DOB [birth year], MRN [id number]. Admission date: 02/15/2018 Discharge Date 02/17/2018 Primary MD Jodi Marble, MD Admitting Physician Saundra Shelling, MD  Admission Diagnosis  Atrial fibrillation with RVR ([place]) [I48.91] HCAP (healthcare-associated pneumonia) [J18.9]  Discharge Diagnosis   Active Problems: Sepsis ruled out Pneumonia ruled out Hypoxia due to atelectasis Hypotension due to medications now resolved End-stage renal disease Type 2 diabetes  Hospital Course -year-old male patient with history of end-stage renal disease on dialysis, chronic congestive heart failure, diabetes mellitus type 2, hypertension, coronary artery disease presented to the emergency room for weakness, dizziness.  Patient was noted to have hypoxia in the emergency room.  Chest x-ray suggestive of atelectasis however he was admitted for sepsis.  Patient had no fevers WBC count was normal.  Patient was treated with antibiotics.  I started him on incentive spirometry.  Patient is doing much better no further hypoxia he is stable for discharge.             Consults  nephrology  Significant Tests:  See full reports for all details    Dg Chest 2 View  Result Date: 02/15/2018 CLINICAL DATA:  Dizziness, lightheadedness and weakness since this morning at 7 a.m. EXAM: CHEST - 2 VIEW COMPARISON:  01/10/2018. FINDINGS: Interval right jugular catheter with its tip in the superior vena cava near the cavoatrial junction. Stable enlarged cardiac silhouette and tortuous aorta. Stable right coronary artery stent. Interval mild patchy opacity in the left lower lobe. Remainder of the lungs are clear. Thoracic spine degenerative changes. IMPRESSION: 1. Interval mild patchy atelectasis or pneumonia in the left lower lobe. 2. Stable cardiomegaly. Electronically Signed   By: Claudie Revering M.D.   On: 02/15/2018 11:39   Ct Head Wo  Contrast  Result Date: 02/15/2018 CLINICAL DATA:  Dizziness, lightheadedness, weakness EXAM: CT HEAD WITHOUT CONTRAST TECHNIQUE: Contiguous axial images were obtained from the base of the skull through the vertex without intravenous contrast. COMPARISON:  01/18/2018 FINDINGS: Brain: No evidence of acute infarction, hemorrhage, extra-axial collection, ventriculomegaly, or mass effect. Cystic mass in the right middle cranial fossa most consistent with an arachnoid cyst generalized cerebral atrophy. Periventricular white matter low attenuation likely secondary to microangiopathy. Vascular: Cerebrovascular atherosclerotic calcifications are noted. Skull: Negative for fracture or focal lesion. Sinuses/Orbits: Visualized portions of the orbits are unremarkable. Visualized portions of the paranasal sinuses and mastoid air cells are unremarkable. Other: None. IMPRESSION: No acute intracranial pathology. Electronically Signed   By: Kathreen Devoid   On: 02/15/2018 11:29   Ct Head Wo Contrast  Result Date: 01/18/2018 CLINICAL DATA:  84 year old fall while with at home an unwitnessed, found face down on the floor. Initial encounter. EXAM: CT HEAD WITHOUT CONTRAST CT CERVICAL SPINE WITHOUT CONTRAST TECHNIQUE: Multidetector CT imaging of the head and cervical spine was performed following the standard protocol without intravenous contrast. Multiplanar CT image reconstructions of the cervical spine were also generated. COMPARISON:  None. FINDINGS: CT HEAD FINDINGS Brain: Severe age related cortical atrophy, moderate deep atrophy and mild cerebellar atrophy. Arachnoid cyst involving the RIGHT MIDDLE cranial fossa. Mild to moderate changes of small vessel disease of the white matter. No mass lesion. No midline shift. No acute hemorrhage or hematoma. No extra-axial fluid collections. No evidence of acute infarction. Vascular: Moderate BILATERAL carotid siphon and BILATERAL vertebral artery atherosclerosis. Extensive intracranial  atherosclerosis. No hyperdense vessel. Skull: No skull fracture or other focal osseous abnormality involving the skull. Sinuses/Orbits:  Mucous retention cyst or polyp in the LEFT maxillary sinus and in a LEFT POSTERIOR ethmoid air cell. Remaining visualized paranasal sinuses, BILATERAL mastoid air cells and BILATERAL middle ear cavities well-aerated. Visualized orbits and globes intact. Other: None. CT CERVICAL SPINE FINDINGS Alignment: Anatomic POSTERIOR alignment. Straightening of the usual cervical lordosis. Skull base and vertebrae: No fractures identified involving the cervical spine. Facet joints intact with degenerative changes throughout. Coronal reformatted images demonstrate an intact craniocervical junction, intact dens and intact lateral masses throughout. Soft tissues and spinal canal: No paraspinal or canal hematoma. Moderate spinal stenosis at C4-5 and C5-6 and mild spinal stenosis at C6-7. Disc levels: Acquired fusion of the C2 and C3 vertebral bodies with congenital fusion of the vertebral bodies and POSTERIOR elements of C3 and C4. 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 Disc space narrowing and endplate hypertrophic changes at every level from C4-5 through C7-T1. Facet and uncinate hypertrophy account for multilevel foraminal stenoses including severe RIGHT and mild LEFT C2-3, severe LEFT and moderate RIGHT C4-5, severe BILATERAL C6-7. Upper chest: Visualized lung apices clear. Visualized superior mediastinum normal. Other: None. IMPRESSION: CT Head: 1. No acute intracranial abnormality. 2. Severe age related cortical atrophy, moderate deep atrophy and mild cerebellar atrophy. Mild to moderate chronic microvascular ischemic changes of the white matter. 3. Benign arachnoid cyst involving the RIGHT MIDDLE cranial fossa. CT Cervical Spine: 1. No cervical spine fractures identified. 2. Multilevel degenerative  disc disease, spondylosis and facet degenerative changes with multilevel foraminal stenoses as detailed above. 3. Moderate multifactorial spinal stenosis at C4-5 and C5-6 and mild multifactorial spinal stenosis at C6-7. Electronically Signed   By: Evangeline Dakin M.D.   On: 01/18/2018 19:05   Ct Cervical Spine Wo Contrast  Result Date: 01/18/2018 CLINICAL DATA:  83 year old fall while with at home an unwitnessed, found face down on the floor. Initial encounter. EXAM: CT HEAD WITHOUT CONTRAST CT CERVICAL SPINE WITHOUT CONTRAST TECHNIQUE: Multidetector CT imaging of the head and cervical spine was performed following the standard protocol without intravenous contrast. Multiplanar CT image reconstructions of the cervical spine were also generated. COMPARISON:  None. FINDINGS: CT HEAD FINDINGS Brain: Severe age related cortical atrophy, moderate deep atrophy and mild cerebellar atrophy. Arachnoid cyst involving the RIGHT MIDDLE cranial fossa. Mild to moderate changes of small vessel disease of the white matter. No mass lesion. No midline shift. No acute hemorrhage or hematoma. No extra-axial fluid collections. No evidence of acute infarction. Vascular: Moderate BILATERAL carotid siphon and BILATERAL vertebral artery atherosclerosis. Extensive intracranial atherosclerosis. No hyperdense vessel. Skull: No skull fracture or other focal osseous abnormality involving the skull. Sinuses/Orbits: Mucous retention cyst or polyp in the LEFT maxillary sinus and in a LEFT POSTERIOR ethmoid air cell. Remaining visualized paranasal sinuses, BILATERAL mastoid air cells and BILATERAL middle ear cavities well-aerated. Visualized orbits and globes intact. Other: None. CT CERVICAL SPINE FINDINGS Alignment: Anatomic POSTERIOR alignment. Straightening of the usual cervical lordosis. Skull base and vertebrae: No fractures identified involving the cervical spine. Facet joints intact with degenerative changes throughout. Coronal  reformatted images demonstrate an intact craniocervical junction, intact dens and intact lateral masses throughout. Soft tissues and spinal canal: No paraspinal or canal hematoma. Moderate spinal stenosis at C4-5 and C5-6 and mild spinal stenosis at C6-7. Disc levels: Acquired fusion of the C2 and C3 vertebral bodies with congenital fusion of the vertebral bodies and POSTERIOR elements of C3 and C4. Large bridging ANTERIOR  osteophytes with ossification in the ANTERIOR longitudinal ligament from C3 through the UPPER endplate of C7. Disc space narrowing and endplate hypertrophic changes at every level from C4-5 through C7-T1. Facet and uncinate hypertrophy account for multilevel foraminal stenoses including severe RIGHT and mild LEFT C2-3, severe LEFT and moderate RIGHT C4-5, severe BILATERAL C6-7. Upper chest: Visualized lung apices clear. Visualized superior mediastinum normal. Other: None. IMPRESSION: CT Head: 1. No acute intracranial abnormality. 2. Severe age related cortical atrophy, moderate deep atrophy and mild cerebellar atrophy. Mild to moderate chronic microvascular ischemic changes of the white matter. 3. Benign arachnoid cyst involving the RIGHT MIDDLE cranial fossa. CT Cervical Spine: 1. No cervical spine fractures identified. 2. Multilevel degenerative disc disease, spondylosis and facet degenerative changes with multilevel foraminal stenoses as detailed above. 3. Moderate multifactorial spinal stenosis at C4-5 and C5-6 and mild multifactorial spinal stenosis at C6-7. Electronically Signed   By: Evangeline Dakin M.D.   On: 01/18/2018 19:05       Today   Subjective:   Alvis Lemmings patient doing better Objective:   Blood pressure (!) 115/57, pulse 65, temperature 97.6 F (36.4 C), temperature source Oral, resp. rate 18, height 6' (1.829 m), weight 95.3 kg, SpO2 98 %.  .  Intake/Output Summary (Last 24 hours) at 02/17/2018 1828 Last data filed at 02/17/2018 1013 Gross per 24 hour  Intake  0 ml  Output 275 ml  Net -275 ml    Exam VITAL SIGNS: Blood pressure (!) 115/57, pulse 65, temperature 97.6 F (36.4 C), temperature source Oral, resp. rate 18, height 6' (1.829 m), weight 95.3 kg, SpO2 98 %.  GENERAL:  84 y.o.-year-old patient lying in the bed with no acute distress.  EYES: Pupils equal, round, reactive to light and accommodation. No scleral icterus. Extraocular muscles intact.  HEENT: Head atraumatic, normocephalic. Oropharynx and nasopharynx clear.  NECK:  Supple, no jugular venous distention. No thyroid enlargement, no tenderness.  LUNGS: Normal breath sounds bilaterally, no wheezing, rales,rhonchi or crepitation. No use of accessory muscles of respiration.  CARDIOVASCULAR: S1, S2 normal. No murmurs, rubs, or gallops.  ABDOMEN: Soft, nontender, nondistended. Bowel sounds present. No organomegaly or mass.  EXTREMITIES: No pedal edema, cyanosis, or clubbing.  NEUROLOGIC: Cranial nerves II through XII are intact. Muscle strength 5/5 in all extremities. Sensation intact. Gait not checked.  PSYCHIATRIC: The patient is alert and oriented x 3.  SKIN: No obvious rash, lesion, or ulcer.   Data Review     CBC w Diff:  Lab Results  Component Value Date   WBC 6.4 02/16/2018   HGB 11.3 (L) 02/16/2018   HCT 32.7 (L) 02/16/2018   PLT 133 (L) 02/16/2018   LYMPHOPCT 12 02/15/2018   MONOPCT 14 02/15/2018   EOSPCT 3 02/15/2018   BASOPCT 1 02/15/2018   CMP:  Lab Results  Component Value Date   NA 141 02/16/2018   K 3.9 02/16/2018   CL 103 02/16/2018   CO2 30 02/16/2018   BUN 39 (H) 02/16/2018   CREATININE 3.90 (H) 02/16/2018   PROT 6.4 (L) 02/15/2018   ALBUMIN 3.6 02/15/2018   BILITOT 1.4 (H) 02/15/2018   ALKPHOS 42 02/15/2018   AST 42 (H) 02/15/2018   ALT 21 02/15/2018  .  Micro Results Recent Results (from the past 240 hour(s))  Blood culture (routine x 2)     Status: None (Preliminary result)   Collection Time: 02/15/18 12:58 PM  Result Value Ref Range  Status   Specimen Description BLOOD LEFT AC  Final   Special Requests   Final    BOTTLES DRAWN AEROBIC AND ANAEROBIC Blood Culture adequate volume   Culture   Final    NO GROWTH 2 DAYS Performed at [facility], [address] [place], [place] [address]    Report Status PENDING  Incomplete  Blood culture (routine x 2)     Status: None (Preliminary result)   Collection Time: 02/15/18 12:58 PM  Result Value Ref Range Status   Specimen Description BLOOD RIGHT HAND  Final   Special Requests   Final    BOTTLES DRAWN AEROBIC AND ANAEROBIC Blood Culture results may not be optimal due to an excessive volume of blood received in culture bottles   Culture   Final    NO GROWTH 2 DAYS Performed at [facility], [address] [place], [place] [address]    Report Status PENDING  Incomplete  MRSA PCR Screening     Status: None   Collection Time: 02/15/18  8:34 PM  Result Value Ref Range Status   MRSA by PCR NEGATIVE NEGATIVE Final    Comment:        The GeneXpert MRSA Assay (FDA approved for NASAL specimens only), is one component of a comprehensive MRSA colonization surveillance program. It is not intended to diagnose MRSA infection nor to guide or monitor treatment for MRSA infections. Performed at [facility], [address], [place], [place] [address]      Code Status History    Date Active Date Inactive Code Status Order ID Comments User Context   02/15/2018 1910 02/17/2018 1529 DNR [id number]  Saundra Shelling, MD Inpatient   01/19/2018 0104 01/20/2018 1812 DNR [id number]  Amelia Jo, MD Inpatient   01/10/2018 1248 01/15/2018 1546 DNR [id number]  Loletha Grayer, MD Inpatient   01/10/2018 1224 01/10/2018 1248 Full Code [id number]  Loletha Grayer, MD Inpatient   12/09/2017 1835 12/11/2017 1508 Full Code [id number]  Loletha Grayer, MD Inpatient   12/06/2013 1125 12/07/2013 1536 Full Code [id number]  Belva Crome, MD Inpatient   12/04/2013 0955 12/06/2013 1125  Full Code [id number]  Leonie Man, MD Inpatient   12/03/2013 0407 12/04/2013 0955 Full Code [id number]  Rise Patience, MD Inpatient    Questions for Most Recent Historical Code Status (Order [id number])    Question Answer Comment   In the event of cardiac or respiratory ARREST Do not call a "code blue"    In the event of cardiac or respiratory ARREST Do not perform Intubation, CPR, defibrillation or ACLS    In the event of cardiac or respiratory ARREST Use medication by any route, position, wound care, and other measures to relive pain and suffering. May use oxygen, suction and manual treatment of airway obstruction as needed for comfort.    Comments nurse may pronounce         Advance Directive Documentation     Most Recent Value  Type of Advance Directive  Healthcare Power of Attorney, Living will [daughter Bonnie]  Pre-existing out of facility DNR order (yellow form or pink MOST form)  -  "MOST" Form in Place?  -          Follow-up Information    Jodi Marble, MD On 02/22/2018.   Specialty:  Internal Medicine Why:  Wednesday October 2nd at 2:45pm for a follow-up Contact information: [place] [address] [phone]           Discharge Medications   Allergies as of 02/17/2018  Reactions   Amoxicillin Diarrhea      Medication List    TAKE these medications   amiodarone 200 MG tablet Commonly known as:  PACERONE Take 200 mg by mouth daily.   atorvastatin 40 MG tablet Commonly known as:  LIPITOR Take 40 mg by mouth at bedtime.   carvedilol 3.125 MG tablet Commonly known as:  COREG Take 1 tablet (3.125 mg total) by mouth 2 (two) times daily with a meal.   CENTRUM SILVER ADULT 50+ Tabs Take 1 tablet by mouth daily with breakfast.   clopidogrel 75 MG tablet Commonly known as:  PLAVIX Take 1 tablet (75 mg total) by mouth daily with breakfast.   fenofibrate 160 MG tablet Take 160 mg by mouth daily.   ferrous sulfate 325  (65 FE) MG tablet Take 650 mg by mouth daily with breakfast.   Fish Oil 1000 MG Caps Take 4,000 mg by mouth daily at 12 noon.   fluticasone 50 MCG/ACT nasal spray Commonly known as:  FLONASE Place 2 sprays into both nostrils at bedtime.   furosemide 40 MG tablet Commonly known as:  LASIX Take 2 tablets (80 mg total) by mouth 2 (two) times daily. What changed:  when to take this   levocetirizine 5 MG tablet Commonly known as:  XYZAL Take 5 mg by mouth every evening.   levofloxacin 500 MG tablet Commonly known as:  LEVAQUIN Take 1 tablet (500 mg total) by mouth every other day for 6 days.   nitroGLYCERIN 0.4 MG SL tablet Commonly known as:  NITROSTAT Place 0.4 mg under the tongue every 5 (five) minutes as needed for chest pain. Notes to patient:  As needed   potassium chloride SA 20 MEQ tablet Commonly known as:  K-DUR,KLOR-CON Take 2 tablets (40 mEq total) by mouth daily.   PROAIR HFA 108 (90 Base) MCG/ACT inhaler Generic drug:  albuterol Inhale 2 puffs into the lungs every 6 (six) hours as needed.   Vitamin D3 2000 units Tabs Take 2,000 Units by mouth daily with breakfast.          Total Time in preparing paper work, data evaluation and todays exam - 40 minutes  Dustin Flock M.D on 02/17/2018 at 6:28 PM [place]  [phone]

## 2018-02-17 NOTE — Progress Notes (Signed)
[place], [place] 02/17/18  Subjective:   Patient is doing well.  He tolerated dialysis well yesterday.  No cough or shortness of breath.  Currently on room air  Objective:  Vital signs in last 24 hours:  Temp:  [97.6 F (36.4 C)-98.3 F (36.8 C)] 97.6 F (36.4 C) (09/27 0513) Pulse Rate:  [65-98] 65 (09/27 0513) Resp:  [14-24] 18 (09/27 0513) BP: (96-126)/(54-80) 115/57 (09/27 0513) SpO2:  [97 %-100 %] 98 % (09/27 0721) Weight:  [95.3 kg-95.5 kg] 95.3 kg (09/26 1640)  Weight change: 6.051 kg Filed Weights   02/16/18 0257 02/16/18 1317 02/16/18 1640  Weight: 92.5 kg 95.5 kg 95.3 kg    Intake/Output:    Intake/Output Summary (Last 24 hours) at 02/17/2018 0935 Last data filed at 02/17/2018 0851 Gross per 24 hour  Intake 263.1 ml  Output 158 ml  Net 105.1 ml     Physical Exam: General:  Sitting up in the bed, no acute distress  HEENT  anicteric, moist oral mucous membranes  Neck  supple, no masses  Pulm/lungs  normal breathing effort, room air  CVS/Heart  irregular rhythm  Abdomen:   Soft, nontender  Extremities:  No peripheral edema  Neurologic:  Alert, oriented  Skin:  No acute rashes  Access:  Right IJ PermCath       Basic Metabolic Panel:  Recent Labs  Lab 02/15/18 1039 02/16/18 0402  NA 135 141  K 3.5 3.9  CL 98 103  CO2 24 30  GLUCOSE 149* 123*  BUN 39* 39*  CREATININE 3.39* 3.90*  CALCIUM 9.1 9.3     CBC: Recent Labs  Lab 02/15/18 1039 02/16/18 0402  WBC 5.5 6.4  NEUTROABS 3.8  --   HGB 11.9* 11.3*  HCT 34.3* 32.7*  MCV 89.5 89.1  PLT 129* 133*     No results found for: HEPBSAG, HEPBSAB, HEPBIGM    Microbiology:  Recent Results (from the past 240 hour(s))  Blood culture (routine x 2)     Status: None (Preliminary result)   Collection Time: 02/15/18 12:58 PM  Result Value Ref Range Status   Specimen Description BLOOD LEFT AC  Final   Special Requests   Final    BOTTLES DRAWN AEROBIC AND  ANAEROBIC Blood Culture adequate volume   Culture   Final    NO GROWTH 2 DAYS Performed at [facility], [address], [place], [place] [address]    Report Status PENDING  Incomplete  Blood culture (routine x 2)     Status: None (Preliminary result)   Collection Time: 02/15/18 12:58 PM  Result Value Ref Range Status   Specimen Description BLOOD RIGHT HAND  Final   Special Requests   Final    BOTTLES DRAWN AEROBIC AND ANAEROBIC Blood Culture results may not be optimal due to an excessive volume of blood received in culture bottles   Culture   Final    NO GROWTH 2 DAYS Performed at [facility], [address], [place], [place] [address]    Report Status PENDING  Incomplete  MRSA PCR Screening     Status: None   Collection Time: 02/15/18  8:34 PM  Result Value Ref Range Status   MRSA by PCR NEGATIVE NEGATIVE Final    Comment:        The GeneXpert MRSA Assay (FDA approved for NASAL specimens only), is one component of a comprehensive MRSA colonization surveillance program. It is not intended to diagnose MRSA infection  nor to guide or monitor treatment for MRSA infections. Performed at [facility], [address] [place], [place] [address]     Coagulation Studies: No results for input(s): LABPROT, INR in the last 72 hours.  Urinalysis: No results for input(s): COLORURINE, LABSPEC, PHURINE, GLUCOSEU, HGBUR, BILIRUBINUR, KETONESUR, PROTEINUR, UROBILINOGEN, NITRITE, LEUKOCYTESUR in the last 72 hours.  Invalid input(s): APPERANCEUR    Imaging: Dg Chest 2 View  Result Date: 02/15/2018 CLINICAL DATA:  Dizziness, lightheadedness and weakness since this morning at 7 a.m. EXAM: CHEST - 2 VIEW COMPARISON:  01/10/2018. FINDINGS: Interval right jugular catheter with its tip in the superior vena cava near the cavoatrial junction. Stable enlarged cardiac silhouette and tortuous aorta. Stable right coronary artery stent. Interval mild patchy opacity in  the left lower lobe. Remainder of the lungs are clear. Thoracic spine degenerative changes. IMPRESSION: 1. Interval mild patchy atelectasis or pneumonia in the left lower lobe. 2. Stable cardiomegaly. Electronically Signed   By: Claudie Revering M.D.   On: 02/15/2018 11:39   Ct Head Wo Contrast  Result Date: 02/15/2018 CLINICAL DATA:  Dizziness, lightheadedness, weakness EXAM: CT HEAD WITHOUT CONTRAST TECHNIQUE: Contiguous axial images were obtained from the base of the skull through the vertex without intravenous contrast. COMPARISON:  01/18/2018 FINDINGS: Brain: No evidence of acute infarction, hemorrhage, extra-axial collection, ventriculomegaly, or mass effect. Cystic mass in the right middle cranial fossa most consistent with an arachnoid cyst generalized cerebral atrophy. Periventricular white matter low attenuation likely secondary to microangiopathy. Vascular: Cerebrovascular atherosclerotic calcifications are noted. Skull: Negative for fracture or focal lesion. Sinuses/Orbits: Visualized portions of the orbits are unremarkable. Visualized portions of the paranasal sinuses and mastoid air cells are unremarkable. Other: None. IMPRESSION: No acute intracranial pathology. Electronically Signed   By: Kathreen Devoid   On: 02/15/2018 11:29     Medications:   . sodium chloride Stopped (02/16/18 1719)  . ceFEPime (MAXIPIME) IV 200 mL/hr at 02/16/18 1738   . amiodarone  200 mg Oral Daily  . aspirin EC  81 mg Oral QHS  . atorvastatin  40 mg Oral QHS  . budesonide (PULMICORT) nebulizer solution  0.25 mg Nebulization BID  . Chlorhexidine Gluconate Cloth  6 each Topical Q0600  . cholecalciferol  2,000 Units Oral Q breakfast  . clopidogrel  75 mg Oral Q breakfast  . [START ON 02/18/2018] epoetin (EPOGEN/PROCRIT) injection  4,000 Units Intravenous Q T,Th,Sa-HD  . fenofibrate  160 mg Oral Daily  . heparin  5,000 Units Subcutaneous Q8H  . insulin aspart  0-15 Units Subcutaneous TID WC  . insulin aspart  0-5  Units Subcutaneous QHS  . multivitamin with minerals  1 tablet Oral Q breakfast  . omega-3 acid ethyl esters  4,000 mg Oral Q1200  . sodium chloride flush  3 mL Intravenous Q12H   sodium chloride, acetaminophen **OR** acetaminophen, albuterol, nitroGLYCERIN, ondansetron **OR** ondansetron (ZOFRAN) IV, senna-docusate, sodium chloride flush, zolpidem  Assessment/ Plan:  84 y.o.caucasian male with diabetes, coronary disease, end-stage renal disease presents for evaluation of hypoxia, bradycardia, dizziness and lightheadedness [facility]/ [address] [facility]/TTS/93 kg  1.  End-stage renal disease Patient will continue with his routine dialysis as outpatient He plans to see Dr. Humphrey Rolls with cardiology today for cardiology clearance for PD catheter placement  2.  Anemia of chronic kidney disease.  Continue Epogen with hemodialysis Recent hemoglobin 11.3  3.  Secondary hyperparathyroidism Monitor phosphorus during hospital stay  4.  Hypoxia Empirically being treated for pneumonia with IV antibiotics Improved Expecting to  be discharged today   LOS: 2 [place] Candiss [place]se 9/27/20199:35 [place], [place]  Note: This note was prepared with Dragon dictation. Any transcription errors are unintentional

## 2018-02-18 DIAGNOSIS — Z992 Dependence on renal dialysis: Secondary | ICD-10-CM | POA: Diagnosis not present

## 2018-02-18 DIAGNOSIS — N2581 Secondary hyperparathyroidism of renal origin: Secondary | ICD-10-CM | POA: Diagnosis not present

## 2018-02-18 DIAGNOSIS — N186 End stage renal disease: Secondary | ICD-10-CM | POA: Diagnosis not present

## 2018-02-18 DIAGNOSIS — D509 Iron deficiency anemia, unspecified: Secondary | ICD-10-CM | POA: Diagnosis not present

## 2018-02-20 DIAGNOSIS — Z992 Dependence on renal dialysis: Secondary | ICD-10-CM | POA: Diagnosis not present

## 2018-02-20 DIAGNOSIS — S71112D Laceration without foreign body, left thigh, subsequent encounter: Secondary | ICD-10-CM | POA: Diagnosis not present

## 2018-02-20 DIAGNOSIS — I2510 Atherosclerotic heart disease of native coronary artery without angina pectoris: Secondary | ICD-10-CM | POA: Diagnosis not present

## 2018-02-20 DIAGNOSIS — N186 End stage renal disease: Secondary | ICD-10-CM | POA: Diagnosis not present

## 2018-02-20 DIAGNOSIS — S81012D Laceration without foreign body, left knee, subsequent encounter: Secondary | ICD-10-CM | POA: Diagnosis not present

## 2018-02-20 DIAGNOSIS — S50811D Abrasion of right forearm, subsequent encounter: Secondary | ICD-10-CM | POA: Diagnosis not present

## 2018-02-20 DIAGNOSIS — S60413D Abrasion of left middle finger, subsequent encounter: Secondary | ICD-10-CM | POA: Diagnosis not present

## 2018-02-20 DIAGNOSIS — S1091XD Abrasion of unspecified part of neck, subsequent encounter: Secondary | ICD-10-CM | POA: Diagnosis not present

## 2018-02-20 LAB — CULTURE, BLOOD (ROUTINE X 2)
Culture: NO GROWTH
Culture: NO GROWTH
SPECIAL REQUESTS: ADEQUATE

## 2018-02-21 DIAGNOSIS — Z23 Encounter for immunization: Secondary | ICD-10-CM | POA: Diagnosis not present

## 2018-02-21 DIAGNOSIS — D509 Iron deficiency anemia, unspecified: Secondary | ICD-10-CM | POA: Diagnosis not present

## 2018-02-21 DIAGNOSIS — Z992 Dependence on renal dialysis: Secondary | ICD-10-CM | POA: Diagnosis not present

## 2018-02-21 DIAGNOSIS — N186 End stage renal disease: Secondary | ICD-10-CM | POA: Diagnosis not present

## 2018-02-21 DIAGNOSIS — N2581 Secondary hyperparathyroidism of renal origin: Secondary | ICD-10-CM | POA: Diagnosis not present

## 2018-02-22 DIAGNOSIS — I519 Heart disease, unspecified: Secondary | ICD-10-CM | POA: Diagnosis not present

## 2018-02-22 DIAGNOSIS — I4891 Unspecified atrial fibrillation: Secondary | ICD-10-CM | POA: Diagnosis not present

## 2018-02-22 DIAGNOSIS — N189 Chronic kidney disease, unspecified: Secondary | ICD-10-CM | POA: Diagnosis not present

## 2018-02-23 DIAGNOSIS — D509 Iron deficiency anemia, unspecified: Secondary | ICD-10-CM | POA: Diagnosis not present

## 2018-02-23 DIAGNOSIS — N2581 Secondary hyperparathyroidism of renal origin: Secondary | ICD-10-CM | POA: Diagnosis not present

## 2018-02-23 DIAGNOSIS — Z23 Encounter for immunization: Secondary | ICD-10-CM | POA: Diagnosis not present

## 2018-02-23 DIAGNOSIS — N186 End stage renal disease: Secondary | ICD-10-CM | POA: Diagnosis not present

## 2018-02-23 DIAGNOSIS — Z992 Dependence on renal dialysis: Secondary | ICD-10-CM | POA: Diagnosis not present

## 2018-02-24 DIAGNOSIS — I2510 Atherosclerotic heart disease of native coronary artery without angina pectoris: Secondary | ICD-10-CM | POA: Diagnosis not present

## 2018-02-24 DIAGNOSIS — S71112D Laceration without foreign body, left thigh, subsequent encounter: Secondary | ICD-10-CM | POA: Diagnosis not present

## 2018-02-24 DIAGNOSIS — S50811D Abrasion of right forearm, subsequent encounter: Secondary | ICD-10-CM | POA: Diagnosis not present

## 2018-02-24 DIAGNOSIS — S81012D Laceration without foreign body, left knee, subsequent encounter: Secondary | ICD-10-CM | POA: Diagnosis not present

## 2018-02-24 DIAGNOSIS — S60413D Abrasion of left middle finger, subsequent encounter: Secondary | ICD-10-CM | POA: Diagnosis not present

## 2018-02-24 DIAGNOSIS — S1091XD Abrasion of unspecified part of neck, subsequent encounter: Secondary | ICD-10-CM | POA: Diagnosis not present

## 2018-02-25 DIAGNOSIS — Z992 Dependence on renal dialysis: Secondary | ICD-10-CM | POA: Diagnosis not present

## 2018-02-25 DIAGNOSIS — N2581 Secondary hyperparathyroidism of renal origin: Secondary | ICD-10-CM | POA: Diagnosis not present

## 2018-02-25 DIAGNOSIS — D509 Iron deficiency anemia, unspecified: Secondary | ICD-10-CM | POA: Diagnosis not present

## 2018-02-25 DIAGNOSIS — N186 End stage renal disease: Secondary | ICD-10-CM | POA: Diagnosis not present

## 2018-02-25 DIAGNOSIS — Z23 Encounter for immunization: Secondary | ICD-10-CM | POA: Diagnosis not present

## 2018-02-27 DIAGNOSIS — Z23 Encounter for immunization: Secondary | ICD-10-CM | POA: Diagnosis not present

## 2018-02-27 DIAGNOSIS — S60413D Abrasion of left middle finger, subsequent encounter: Secondary | ICD-10-CM | POA: Diagnosis not present

## 2018-02-27 DIAGNOSIS — S71112D Laceration without foreign body, left thigh, subsequent encounter: Secondary | ICD-10-CM | POA: Diagnosis not present

## 2018-02-27 DIAGNOSIS — S81012D Laceration without foreign body, left knee, subsequent encounter: Secondary | ICD-10-CM | POA: Diagnosis not present

## 2018-02-27 DIAGNOSIS — Z992 Dependence on renal dialysis: Secondary | ICD-10-CM | POA: Diagnosis not present

## 2018-02-27 DIAGNOSIS — S1091XD Abrasion of unspecified part of neck, subsequent encounter: Secondary | ICD-10-CM | POA: Diagnosis not present

## 2018-02-27 DIAGNOSIS — D509 Iron deficiency anemia, unspecified: Secondary | ICD-10-CM | POA: Diagnosis not present

## 2018-02-27 DIAGNOSIS — N186 End stage renal disease: Secondary | ICD-10-CM | POA: Diagnosis not present

## 2018-02-27 DIAGNOSIS — S50811D Abrasion of right forearm, subsequent encounter: Secondary | ICD-10-CM | POA: Diagnosis not present

## 2018-02-27 DIAGNOSIS — N2581 Secondary hyperparathyroidism of renal origin: Secondary | ICD-10-CM | POA: Diagnosis not present

## 2018-02-27 DIAGNOSIS — I2510 Atherosclerotic heart disease of native coronary artery without angina pectoris: Secondary | ICD-10-CM | POA: Diagnosis not present

## 2018-02-28 DIAGNOSIS — N189 Chronic kidney disease, unspecified: Secondary | ICD-10-CM | POA: Diagnosis not present

## 2018-02-28 DIAGNOSIS — N19 Unspecified kidney failure: Secondary | ICD-10-CM | POA: Diagnosis not present

## 2018-02-28 DIAGNOSIS — E8770 Fluid overload, unspecified: Secondary | ICD-10-CM | POA: Diagnosis not present

## 2018-02-28 DIAGNOSIS — Z4902 Encounter for fitting and adjustment of peritoneal dialysis catheter: Secondary | ICD-10-CM | POA: Diagnosis not present

## 2018-02-28 DIAGNOSIS — N186 End stage renal disease: Secondary | ICD-10-CM | POA: Diagnosis not present

## 2018-02-28 DIAGNOSIS — Z992 Dependence on renal dialysis: Secondary | ICD-10-CM | POA: Diagnosis not present

## 2018-02-28 DIAGNOSIS — N185 Chronic kidney disease, stage 5: Secondary | ICD-10-CM | POA: Diagnosis not present

## 2018-02-28 DIAGNOSIS — I4891 Unspecified atrial fibrillation: Secondary | ICD-10-CM | POA: Diagnosis not present

## 2018-03-02 DIAGNOSIS — N186 End stage renal disease: Secondary | ICD-10-CM | POA: Diagnosis not present

## 2018-03-02 DIAGNOSIS — D509 Iron deficiency anemia, unspecified: Secondary | ICD-10-CM | POA: Diagnosis not present

## 2018-03-02 DIAGNOSIS — Z23 Encounter for immunization: Secondary | ICD-10-CM | POA: Diagnosis not present

## 2018-03-02 DIAGNOSIS — N2581 Secondary hyperparathyroidism of renal origin: Secondary | ICD-10-CM | POA: Diagnosis not present

## 2018-03-02 DIAGNOSIS — Z992 Dependence on renal dialysis: Secondary | ICD-10-CM | POA: Diagnosis not present

## 2018-03-03 DIAGNOSIS — S71112D Laceration without foreign body, left thigh, subsequent encounter: Secondary | ICD-10-CM | POA: Diagnosis not present

## 2018-03-03 DIAGNOSIS — S81012D Laceration without foreign body, left knee, subsequent encounter: Secondary | ICD-10-CM | POA: Diagnosis not present

## 2018-03-03 DIAGNOSIS — S60413D Abrasion of left middle finger, subsequent encounter: Secondary | ICD-10-CM | POA: Diagnosis not present

## 2018-03-03 DIAGNOSIS — I2510 Atherosclerotic heart disease of native coronary artery without angina pectoris: Secondary | ICD-10-CM | POA: Diagnosis not present

## 2018-03-03 DIAGNOSIS — S50811D Abrasion of right forearm, subsequent encounter: Secondary | ICD-10-CM | POA: Diagnosis not present

## 2018-03-03 DIAGNOSIS — S1091XD Abrasion of unspecified part of neck, subsequent encounter: Secondary | ICD-10-CM | POA: Diagnosis not present

## 2018-03-04 DIAGNOSIS — N2581 Secondary hyperparathyroidism of renal origin: Secondary | ICD-10-CM | POA: Diagnosis not present

## 2018-03-04 DIAGNOSIS — D509 Iron deficiency anemia, unspecified: Secondary | ICD-10-CM | POA: Diagnosis not present

## 2018-03-04 DIAGNOSIS — Z23 Encounter for immunization: Secondary | ICD-10-CM | POA: Diagnosis not present

## 2018-03-04 DIAGNOSIS — Z992 Dependence on renal dialysis: Secondary | ICD-10-CM | POA: Diagnosis not present

## 2018-03-04 DIAGNOSIS — N186 End stage renal disease: Secondary | ICD-10-CM | POA: Diagnosis not present

## 2018-03-06 DIAGNOSIS — S1091XD Abrasion of unspecified part of neck, subsequent encounter: Secondary | ICD-10-CM | POA: Diagnosis not present

## 2018-03-06 DIAGNOSIS — S60413D Abrasion of left middle finger, subsequent encounter: Secondary | ICD-10-CM | POA: Diagnosis not present

## 2018-03-06 DIAGNOSIS — S50811D Abrasion of right forearm, subsequent encounter: Secondary | ICD-10-CM | POA: Diagnosis not present

## 2018-03-06 DIAGNOSIS — S81012D Laceration without foreign body, left knee, subsequent encounter: Secondary | ICD-10-CM | POA: Diagnosis not present

## 2018-03-06 DIAGNOSIS — S71112D Laceration without foreign body, left thigh, subsequent encounter: Secondary | ICD-10-CM | POA: Diagnosis not present

## 2018-03-06 DIAGNOSIS — I2510 Atherosclerotic heart disease of native coronary artery without angina pectoris: Secondary | ICD-10-CM | POA: Diagnosis not present

## 2018-03-07 DIAGNOSIS — N186 End stage renal disease: Secondary | ICD-10-CM | POA: Diagnosis not present

## 2018-03-07 DIAGNOSIS — Z992 Dependence on renal dialysis: Secondary | ICD-10-CM | POA: Diagnosis not present

## 2018-03-07 DIAGNOSIS — N2581 Secondary hyperparathyroidism of renal origin: Secondary | ICD-10-CM | POA: Diagnosis not present

## 2018-03-07 DIAGNOSIS — Z23 Encounter for immunization: Secondary | ICD-10-CM | POA: Diagnosis not present

## 2018-03-07 DIAGNOSIS — D509 Iron deficiency anemia, unspecified: Secondary | ICD-10-CM | POA: Diagnosis not present

## 2018-03-09 DIAGNOSIS — N2581 Secondary hyperparathyroidism of renal origin: Secondary | ICD-10-CM | POA: Diagnosis not present

## 2018-03-09 DIAGNOSIS — Z992 Dependence on renal dialysis: Secondary | ICD-10-CM | POA: Diagnosis not present

## 2018-03-09 DIAGNOSIS — Z23 Encounter for immunization: Secondary | ICD-10-CM | POA: Diagnosis not present

## 2018-03-09 DIAGNOSIS — N186 End stage renal disease: Secondary | ICD-10-CM | POA: Diagnosis not present

## 2018-03-09 DIAGNOSIS — D509 Iron deficiency anemia, unspecified: Secondary | ICD-10-CM | POA: Diagnosis not present

## 2018-03-10 DIAGNOSIS — S60413D Abrasion of left middle finger, subsequent encounter: Secondary | ICD-10-CM | POA: Diagnosis not present

## 2018-03-10 DIAGNOSIS — S71112D Laceration without foreign body, left thigh, subsequent encounter: Secondary | ICD-10-CM | POA: Diagnosis not present

## 2018-03-10 DIAGNOSIS — I2510 Atherosclerotic heart disease of native coronary artery without angina pectoris: Secondary | ICD-10-CM | POA: Diagnosis not present

## 2018-03-10 DIAGNOSIS — S50811D Abrasion of right forearm, subsequent encounter: Secondary | ICD-10-CM | POA: Diagnosis not present

## 2018-03-10 DIAGNOSIS — S1091XD Abrasion of unspecified part of neck, subsequent encounter: Secondary | ICD-10-CM | POA: Diagnosis not present

## 2018-03-10 DIAGNOSIS — S81012D Laceration without foreign body, left knee, subsequent encounter: Secondary | ICD-10-CM | POA: Diagnosis not present

## 2018-03-11 DIAGNOSIS — Z23 Encounter for immunization: Secondary | ICD-10-CM | POA: Diagnosis not present

## 2018-03-11 DIAGNOSIS — N186 End stage renal disease: Secondary | ICD-10-CM | POA: Diagnosis not present

## 2018-03-11 DIAGNOSIS — Z992 Dependence on renal dialysis: Secondary | ICD-10-CM | POA: Diagnosis not present

## 2018-03-11 DIAGNOSIS — D509 Iron deficiency anemia, unspecified: Secondary | ICD-10-CM | POA: Diagnosis not present

## 2018-03-11 DIAGNOSIS — N2581 Secondary hyperparathyroidism of renal origin: Secondary | ICD-10-CM | POA: Diagnosis not present

## 2018-03-14 DIAGNOSIS — S71112D Laceration without foreign body, left thigh, subsequent encounter: Secondary | ICD-10-CM | POA: Diagnosis not present

## 2018-03-14 DIAGNOSIS — N186 End stage renal disease: Secondary | ICD-10-CM | POA: Diagnosis not present

## 2018-03-14 DIAGNOSIS — Z23 Encounter for immunization: Secondary | ICD-10-CM | POA: Diagnosis not present

## 2018-03-14 DIAGNOSIS — D509 Iron deficiency anemia, unspecified: Secondary | ICD-10-CM | POA: Diagnosis not present

## 2018-03-14 DIAGNOSIS — Z992 Dependence on renal dialysis: Secondary | ICD-10-CM | POA: Diagnosis not present

## 2018-03-14 DIAGNOSIS — I2510 Atherosclerotic heart disease of native coronary artery without angina pectoris: Secondary | ICD-10-CM | POA: Diagnosis not present

## 2018-03-14 DIAGNOSIS — S1091XD Abrasion of unspecified part of neck, subsequent encounter: Secondary | ICD-10-CM | POA: Diagnosis not present

## 2018-03-14 DIAGNOSIS — N2581 Secondary hyperparathyroidism of renal origin: Secondary | ICD-10-CM | POA: Diagnosis not present

## 2018-03-14 DIAGNOSIS — S50811D Abrasion of right forearm, subsequent encounter: Secondary | ICD-10-CM | POA: Diagnosis not present

## 2018-03-14 DIAGNOSIS — S81012D Laceration without foreign body, left knee, subsequent encounter: Secondary | ICD-10-CM | POA: Diagnosis not present

## 2018-03-14 DIAGNOSIS — S60413D Abrasion of left middle finger, subsequent encounter: Secondary | ICD-10-CM | POA: Diagnosis not present

## 2018-03-16 DIAGNOSIS — D509 Iron deficiency anemia, unspecified: Secondary | ICD-10-CM | POA: Diagnosis not present

## 2018-03-16 DIAGNOSIS — Z23 Encounter for immunization: Secondary | ICD-10-CM | POA: Diagnosis not present

## 2018-03-16 DIAGNOSIS — Z992 Dependence on renal dialysis: Secondary | ICD-10-CM | POA: Diagnosis not present

## 2018-03-16 DIAGNOSIS — N186 End stage renal disease: Secondary | ICD-10-CM | POA: Diagnosis not present

## 2018-03-16 DIAGNOSIS — N2581 Secondary hyperparathyroidism of renal origin: Secondary | ICD-10-CM | POA: Diagnosis not present

## 2018-03-18 DIAGNOSIS — Z23 Encounter for immunization: Secondary | ICD-10-CM | POA: Diagnosis not present

## 2018-03-18 DIAGNOSIS — N186 End stage renal disease: Secondary | ICD-10-CM | POA: Diagnosis not present

## 2018-03-18 DIAGNOSIS — Z992 Dependence on renal dialysis: Secondary | ICD-10-CM | POA: Diagnosis not present

## 2018-03-18 DIAGNOSIS — D509 Iron deficiency anemia, unspecified: Secondary | ICD-10-CM | POA: Diagnosis not present

## 2018-03-18 DIAGNOSIS — N2581 Secondary hyperparathyroidism of renal origin: Secondary | ICD-10-CM | POA: Diagnosis not present

## 2018-03-20 DIAGNOSIS — S81012D Laceration without foreign body, left knee, subsequent encounter: Secondary | ICD-10-CM | POA: Diagnosis not present

## 2018-03-20 DIAGNOSIS — I2510 Atherosclerotic heart disease of native coronary artery without angina pectoris: Secondary | ICD-10-CM | POA: Diagnosis not present

## 2018-03-20 DIAGNOSIS — S71112D Laceration without foreign body, left thigh, subsequent encounter: Secondary | ICD-10-CM | POA: Diagnosis not present

## 2018-03-20 DIAGNOSIS — S1091XD Abrasion of unspecified part of neck, subsequent encounter: Secondary | ICD-10-CM | POA: Diagnosis not present

## 2018-03-20 DIAGNOSIS — S60413D Abrasion of left middle finger, subsequent encounter: Secondary | ICD-10-CM | POA: Diagnosis not present

## 2018-03-20 DIAGNOSIS — S50811D Abrasion of right forearm, subsequent encounter: Secondary | ICD-10-CM | POA: Diagnosis not present

## 2018-03-21 DIAGNOSIS — E785 Hyperlipidemia, unspecified: Secondary | ICD-10-CM | POA: Diagnosis not present

## 2018-03-21 DIAGNOSIS — Z23 Encounter for immunization: Secondary | ICD-10-CM | POA: Diagnosis not present

## 2018-03-21 DIAGNOSIS — N2581 Secondary hyperparathyroidism of renal origin: Secondary | ICD-10-CM | POA: Diagnosis not present

## 2018-03-21 DIAGNOSIS — N186 End stage renal disease: Secondary | ICD-10-CM | POA: Diagnosis not present

## 2018-03-21 DIAGNOSIS — E663 Overweight: Secondary | ICD-10-CM | POA: Diagnosis not present

## 2018-03-21 DIAGNOSIS — R0602 Shortness of breath: Secondary | ICD-10-CM | POA: Diagnosis not present

## 2018-03-21 DIAGNOSIS — I2510 Atherosclerotic heart disease of native coronary artery without angina pectoris: Secondary | ICD-10-CM | POA: Diagnosis not present

## 2018-03-21 DIAGNOSIS — I25810 Atherosclerosis of coronary artery bypass graft(s) without angina pectoris: Secondary | ICD-10-CM | POA: Diagnosis not present

## 2018-03-21 DIAGNOSIS — I10 Essential (primary) hypertension: Secondary | ICD-10-CM | POA: Diagnosis not present

## 2018-03-21 DIAGNOSIS — Z9861 Coronary angioplasty status: Secondary | ICD-10-CM | POA: Diagnosis not present

## 2018-03-21 DIAGNOSIS — D509 Iron deficiency anemia, unspecified: Secondary | ICD-10-CM | POA: Diagnosis not present

## 2018-03-21 DIAGNOSIS — Z992 Dependence on renal dialysis: Secondary | ICD-10-CM | POA: Diagnosis not present

## 2018-03-22 DIAGNOSIS — N186 End stage renal disease: Secondary | ICD-10-CM | POA: Diagnosis not present

## 2018-03-22 DIAGNOSIS — E1169 Type 2 diabetes mellitus with other specified complication: Secondary | ICD-10-CM | POA: Diagnosis not present

## 2018-03-22 DIAGNOSIS — E782 Mixed hyperlipidemia: Secondary | ICD-10-CM | POA: Diagnosis not present

## 2018-03-22 DIAGNOSIS — Z992 Dependence on renal dialysis: Secondary | ICD-10-CM | POA: Diagnosis not present

## 2018-03-22 DIAGNOSIS — I10 Essential (primary) hypertension: Secondary | ICD-10-CM | POA: Diagnosis not present

## 2018-03-22 DIAGNOSIS — I120 Hypertensive chronic kidney disease with stage 5 chronic kidney disease or end stage renal disease: Secondary | ICD-10-CM | POA: Diagnosis not present

## 2018-03-22 DIAGNOSIS — E1122 Type 2 diabetes mellitus with diabetic chronic kidney disease: Secondary | ICD-10-CM | POA: Diagnosis not present

## 2018-03-23 DIAGNOSIS — N186 End stage renal disease: Secondary | ICD-10-CM | POA: Diagnosis not present

## 2018-03-23 DIAGNOSIS — Z992 Dependence on renal dialysis: Secondary | ICD-10-CM | POA: Diagnosis not present

## 2018-03-23 DIAGNOSIS — D509 Iron deficiency anemia, unspecified: Secondary | ICD-10-CM | POA: Diagnosis not present

## 2018-03-23 DIAGNOSIS — Z23 Encounter for immunization: Secondary | ICD-10-CM | POA: Diagnosis not present

## 2018-03-23 DIAGNOSIS — N2581 Secondary hyperparathyroidism of renal origin: Secondary | ICD-10-CM | POA: Diagnosis not present

## 2018-03-25 DIAGNOSIS — N186 End stage renal disease: Secondary | ICD-10-CM | POA: Diagnosis not present

## 2018-03-25 DIAGNOSIS — Z992 Dependence on renal dialysis: Secondary | ICD-10-CM | POA: Diagnosis not present

## 2018-03-27 DIAGNOSIS — I2510 Atherosclerotic heart disease of native coronary artery without angina pectoris: Secondary | ICD-10-CM | POA: Diagnosis not present

## 2018-03-27 DIAGNOSIS — S50811D Abrasion of right forearm, subsequent encounter: Secondary | ICD-10-CM | POA: Diagnosis not present

## 2018-03-27 DIAGNOSIS — E119 Type 2 diabetes mellitus without complications: Secondary | ICD-10-CM | POA: Diagnosis not present

## 2018-03-27 DIAGNOSIS — S71112D Laceration without foreign body, left thigh, subsequent encounter: Secondary | ICD-10-CM | POA: Diagnosis not present

## 2018-03-27 DIAGNOSIS — S81012D Laceration without foreign body, left knee, subsequent encounter: Secondary | ICD-10-CM | POA: Diagnosis not present

## 2018-03-27 DIAGNOSIS — S1091XD Abrasion of unspecified part of neck, subsequent encounter: Secondary | ICD-10-CM | POA: Diagnosis not present

## 2018-03-27 DIAGNOSIS — S60413D Abrasion of left middle finger, subsequent encounter: Secondary | ICD-10-CM | POA: Diagnosis not present

## 2018-03-28 DIAGNOSIS — N186 End stage renal disease: Secondary | ICD-10-CM | POA: Diagnosis not present

## 2018-03-28 DIAGNOSIS — Z992 Dependence on renal dialysis: Secondary | ICD-10-CM | POA: Diagnosis not present

## 2018-03-28 DIAGNOSIS — I259 Chronic ischemic heart disease, unspecified: Secondary | ICD-10-CM | POA: Diagnosis not present

## 2018-03-28 DIAGNOSIS — D509 Iron deficiency anemia, unspecified: Secondary | ICD-10-CM | POA: Diagnosis not present

## 2018-03-28 DIAGNOSIS — N2581 Secondary hyperparathyroidism of renal origin: Secondary | ICD-10-CM | POA: Diagnosis not present

## 2018-03-29 DIAGNOSIS — N2581 Secondary hyperparathyroidism of renal origin: Secondary | ICD-10-CM | POA: Diagnosis not present

## 2018-03-29 DIAGNOSIS — Z992 Dependence on renal dialysis: Secondary | ICD-10-CM | POA: Diagnosis not present

## 2018-03-29 DIAGNOSIS — N186 End stage renal disease: Secondary | ICD-10-CM | POA: Diagnosis not present

## 2018-03-29 DIAGNOSIS — D509 Iron deficiency anemia, unspecified: Secondary | ICD-10-CM | POA: Diagnosis not present

## 2018-03-30 DIAGNOSIS — D509 Iron deficiency anemia, unspecified: Secondary | ICD-10-CM | POA: Diagnosis not present

## 2018-03-30 DIAGNOSIS — Z992 Dependence on renal dialysis: Secondary | ICD-10-CM | POA: Diagnosis not present

## 2018-03-30 DIAGNOSIS — N186 End stage renal disease: Secondary | ICD-10-CM | POA: Diagnosis not present

## 2018-03-30 DIAGNOSIS — N2581 Secondary hyperparathyroidism of renal origin: Secondary | ICD-10-CM | POA: Diagnosis not present

## 2018-04-01 DIAGNOSIS — Z992 Dependence on renal dialysis: Secondary | ICD-10-CM | POA: Diagnosis not present

## 2018-04-01 DIAGNOSIS — N186 End stage renal disease: Secondary | ICD-10-CM | POA: Diagnosis not present

## 2018-04-03 DIAGNOSIS — Z992 Dependence on renal dialysis: Secondary | ICD-10-CM | POA: Diagnosis not present

## 2018-04-03 DIAGNOSIS — N186 End stage renal disease: Secondary | ICD-10-CM | POA: Diagnosis not present

## 2018-04-04 DIAGNOSIS — Z992 Dependence on renal dialysis: Secondary | ICD-10-CM | POA: Diagnosis not present

## 2018-04-04 DIAGNOSIS — N186 End stage renal disease: Secondary | ICD-10-CM | POA: Diagnosis not present

## 2018-04-05 DIAGNOSIS — Z992 Dependence on renal dialysis: Secondary | ICD-10-CM | POA: Diagnosis not present

## 2018-04-05 DIAGNOSIS — N186 End stage renal disease: Secondary | ICD-10-CM | POA: Diagnosis not present

## 2018-04-06 DIAGNOSIS — S60413D Abrasion of left middle finger, subsequent encounter: Secondary | ICD-10-CM | POA: Diagnosis not present

## 2018-04-06 DIAGNOSIS — I2510 Atherosclerotic heart disease of native coronary artery without angina pectoris: Secondary | ICD-10-CM | POA: Diagnosis not present

## 2018-04-06 DIAGNOSIS — S50811D Abrasion of right forearm, subsequent encounter: Secondary | ICD-10-CM | POA: Diagnosis not present

## 2018-04-06 DIAGNOSIS — S71112D Laceration without foreign body, left thigh, subsequent encounter: Secondary | ICD-10-CM | POA: Diagnosis not present

## 2018-04-06 DIAGNOSIS — S1091XD Abrasion of unspecified part of neck, subsequent encounter: Secondary | ICD-10-CM | POA: Diagnosis not present

## 2018-04-06 DIAGNOSIS — S81012D Laceration without foreign body, left knee, subsequent encounter: Secondary | ICD-10-CM | POA: Diagnosis not present

## 2018-04-07 DIAGNOSIS — N186 End stage renal disease: Secondary | ICD-10-CM | POA: Diagnosis not present

## 2018-04-07 DIAGNOSIS — Z992 Dependence on renal dialysis: Secondary | ICD-10-CM | POA: Diagnosis not present

## 2018-04-10 DIAGNOSIS — Z992 Dependence on renal dialysis: Secondary | ICD-10-CM | POA: Diagnosis not present

## 2018-04-10 DIAGNOSIS — N186 End stage renal disease: Secondary | ICD-10-CM | POA: Diagnosis not present

## 2018-04-11 DIAGNOSIS — N186 End stage renal disease: Secondary | ICD-10-CM | POA: Diagnosis not present

## 2018-04-11 DIAGNOSIS — Z992 Dependence on renal dialysis: Secondary | ICD-10-CM | POA: Diagnosis not present

## 2018-04-12 DIAGNOSIS — N186 End stage renal disease: Secondary | ICD-10-CM | POA: Diagnosis not present

## 2018-04-12 DIAGNOSIS — Z992 Dependence on renal dialysis: Secondary | ICD-10-CM | POA: Diagnosis not present

## 2018-04-13 DIAGNOSIS — N186 End stage renal disease: Secondary | ICD-10-CM | POA: Diagnosis not present

## 2018-04-13 DIAGNOSIS — I2510 Atherosclerotic heart disease of native coronary artery without angina pectoris: Secondary | ICD-10-CM | POA: Diagnosis not present

## 2018-04-13 DIAGNOSIS — S60413D Abrasion of left middle finger, subsequent encounter: Secondary | ICD-10-CM | POA: Diagnosis not present

## 2018-04-13 DIAGNOSIS — S50811D Abrasion of right forearm, subsequent encounter: Secondary | ICD-10-CM | POA: Diagnosis not present

## 2018-04-13 DIAGNOSIS — S1091XD Abrasion of unspecified part of neck, subsequent encounter: Secondary | ICD-10-CM | POA: Diagnosis not present

## 2018-04-13 DIAGNOSIS — Z992 Dependence on renal dialysis: Secondary | ICD-10-CM | POA: Diagnosis not present

## 2018-04-13 DIAGNOSIS — S71112D Laceration without foreign body, left thigh, subsequent encounter: Secondary | ICD-10-CM | POA: Diagnosis not present

## 2018-04-13 DIAGNOSIS — S81012D Laceration without foreign body, left knee, subsequent encounter: Secondary | ICD-10-CM | POA: Diagnosis not present

## 2018-04-14 DIAGNOSIS — Z992 Dependence on renal dialysis: Secondary | ICD-10-CM | POA: Diagnosis not present

## 2018-04-14 DIAGNOSIS — N186 End stage renal disease: Secondary | ICD-10-CM | POA: Diagnosis not present

## 2018-04-15 DIAGNOSIS — Z992 Dependence on renal dialysis: Secondary | ICD-10-CM | POA: Diagnosis not present

## 2018-04-15 DIAGNOSIS — N186 End stage renal disease: Secondary | ICD-10-CM | POA: Diagnosis not present

## 2018-04-16 DIAGNOSIS — N186 End stage renal disease: Secondary | ICD-10-CM | POA: Diagnosis not present

## 2018-04-16 DIAGNOSIS — Z992 Dependence on renal dialysis: Secondary | ICD-10-CM | POA: Diagnosis not present

## 2018-04-17 DIAGNOSIS — Z992 Dependence on renal dialysis: Secondary | ICD-10-CM | POA: Diagnosis not present

## 2018-04-17 DIAGNOSIS — N186 End stage renal disease: Secondary | ICD-10-CM | POA: Diagnosis not present

## 2018-04-18 DIAGNOSIS — Z992 Dependence on renal dialysis: Secondary | ICD-10-CM | POA: Diagnosis not present

## 2018-04-18 DIAGNOSIS — N186 End stage renal disease: Secondary | ICD-10-CM | POA: Diagnosis not present

## 2018-04-19 DIAGNOSIS — N186 End stage renal disease: Secondary | ICD-10-CM | POA: Diagnosis not present

## 2018-04-19 DIAGNOSIS — Z992 Dependence on renal dialysis: Secondary | ICD-10-CM | POA: Diagnosis not present

## 2018-04-20 DIAGNOSIS — Z992 Dependence on renal dialysis: Secondary | ICD-10-CM | POA: Diagnosis not present

## 2018-04-20 DIAGNOSIS — N186 End stage renal disease: Secondary | ICD-10-CM | POA: Diagnosis not present

## 2018-04-21 DIAGNOSIS — I4891 Unspecified atrial fibrillation: Secondary | ICD-10-CM | POA: Diagnosis not present

## 2018-04-21 DIAGNOSIS — D649 Anemia, unspecified: Secondary | ICD-10-CM | POA: Diagnosis not present

## 2018-04-21 DIAGNOSIS — I351 Nonrheumatic aortic (valve) insufficiency: Secondary | ICD-10-CM | POA: Diagnosis not present

## 2018-04-21 DIAGNOSIS — E785 Hyperlipidemia, unspecified: Secondary | ICD-10-CM | POA: Diagnosis not present

## 2018-04-21 DIAGNOSIS — I10 Essential (primary) hypertension: Secondary | ICD-10-CM | POA: Diagnosis not present

## 2018-04-21 DIAGNOSIS — N186 End stage renal disease: Secondary | ICD-10-CM | POA: Diagnosis not present

## 2018-04-21 DIAGNOSIS — G4700 Insomnia, unspecified: Secondary | ICD-10-CM | POA: Diagnosis not present

## 2018-04-21 DIAGNOSIS — Z992 Dependence on renal dialysis: Secondary | ICD-10-CM | POA: Diagnosis not present

## 2018-04-21 DIAGNOSIS — I2510 Atherosclerotic heart disease of native coronary artery without angina pectoris: Secondary | ICD-10-CM | POA: Diagnosis not present

## 2018-04-21 DIAGNOSIS — E1122 Type 2 diabetes mellitus with diabetic chronic kidney disease: Secondary | ICD-10-CM | POA: Diagnosis not present

## 2018-04-22 DIAGNOSIS — N186 End stage renal disease: Secondary | ICD-10-CM | POA: Diagnosis not present

## 2018-04-22 DIAGNOSIS — Z992 Dependence on renal dialysis: Secondary | ICD-10-CM | POA: Diagnosis not present

## 2018-04-23 DIAGNOSIS — Z23 Encounter for immunization: Secondary | ICD-10-CM | POA: Diagnosis not present

## 2018-04-23 DIAGNOSIS — D509 Iron deficiency anemia, unspecified: Secondary | ICD-10-CM | POA: Diagnosis not present

## 2018-04-23 DIAGNOSIS — N186 End stage renal disease: Secondary | ICD-10-CM | POA: Diagnosis not present

## 2018-04-23 DIAGNOSIS — Z992 Dependence on renal dialysis: Secondary | ICD-10-CM | POA: Diagnosis not present

## 2018-04-24 DIAGNOSIS — Z992 Dependence on renal dialysis: Secondary | ICD-10-CM | POA: Diagnosis not present

## 2018-04-24 DIAGNOSIS — T85611A Breakdown (mechanical) of intraperitoneal dialysis catheter, initial encounter: Secondary | ICD-10-CM | POA: Diagnosis not present

## 2018-04-24 DIAGNOSIS — N186 End stage renal disease: Secondary | ICD-10-CM | POA: Diagnosis not present

## 2018-04-24 DIAGNOSIS — D509 Iron deficiency anemia, unspecified: Secondary | ICD-10-CM | POA: Diagnosis not present

## 2018-04-24 DIAGNOSIS — N185 Chronic kidney disease, stage 5: Secondary | ICD-10-CM | POA: Diagnosis not present

## 2018-04-24 DIAGNOSIS — Z23 Encounter for immunization: Secondary | ICD-10-CM | POA: Diagnosis not present

## 2018-04-25 DIAGNOSIS — D509 Iron deficiency anemia, unspecified: Secondary | ICD-10-CM | POA: Diagnosis not present

## 2018-04-25 DIAGNOSIS — Z992 Dependence on renal dialysis: Secondary | ICD-10-CM | POA: Diagnosis not present

## 2018-04-25 DIAGNOSIS — Z23 Encounter for immunization: Secondary | ICD-10-CM | POA: Diagnosis not present

## 2018-04-25 DIAGNOSIS — N186 End stage renal disease: Secondary | ICD-10-CM | POA: Diagnosis not present

## 2018-04-26 DIAGNOSIS — N186 End stage renal disease: Secondary | ICD-10-CM | POA: Diagnosis not present

## 2018-04-26 DIAGNOSIS — Z992 Dependence on renal dialysis: Secondary | ICD-10-CM | POA: Diagnosis not present

## 2018-04-26 DIAGNOSIS — Z23 Encounter for immunization: Secondary | ICD-10-CM | POA: Diagnosis not present

## 2018-04-26 DIAGNOSIS — D509 Iron deficiency anemia, unspecified: Secondary | ICD-10-CM | POA: Diagnosis not present

## 2018-04-27 DIAGNOSIS — N2581 Secondary hyperparathyroidism of renal origin: Secondary | ICD-10-CM | POA: Diagnosis not present

## 2018-04-27 DIAGNOSIS — I120 Hypertensive chronic kidney disease with stage 5 chronic kidney disease or end stage renal disease: Secondary | ICD-10-CM | POA: Diagnosis not present

## 2018-04-27 DIAGNOSIS — Z23 Encounter for immunization: Secondary | ICD-10-CM | POA: Diagnosis not present

## 2018-04-27 DIAGNOSIS — E1122 Type 2 diabetes mellitus with diabetic chronic kidney disease: Secondary | ICD-10-CM | POA: Diagnosis not present

## 2018-04-27 DIAGNOSIS — D509 Iron deficiency anemia, unspecified: Secondary | ICD-10-CM | POA: Diagnosis not present

## 2018-04-27 DIAGNOSIS — N186 End stage renal disease: Secondary | ICD-10-CM | POA: Diagnosis not present

## 2018-04-27 DIAGNOSIS — Z992 Dependence on renal dialysis: Secondary | ICD-10-CM | POA: Diagnosis not present

## 2018-04-27 DIAGNOSIS — E782 Mixed hyperlipidemia: Secondary | ICD-10-CM | POA: Diagnosis not present

## 2018-04-28 DIAGNOSIS — N186 End stage renal disease: Secondary | ICD-10-CM | POA: Diagnosis not present

## 2018-04-28 DIAGNOSIS — Z23 Encounter for immunization: Secondary | ICD-10-CM | POA: Diagnosis not present

## 2018-04-28 DIAGNOSIS — D509 Iron deficiency anemia, unspecified: Secondary | ICD-10-CM | POA: Diagnosis not present

## 2018-04-28 DIAGNOSIS — Z992 Dependence on renal dialysis: Secondary | ICD-10-CM | POA: Diagnosis not present

## 2018-04-29 DIAGNOSIS — Z992 Dependence on renal dialysis: Secondary | ICD-10-CM | POA: Diagnosis not present

## 2018-04-29 DIAGNOSIS — Z23 Encounter for immunization: Secondary | ICD-10-CM | POA: Diagnosis not present

## 2018-04-29 DIAGNOSIS — N186 End stage renal disease: Secondary | ICD-10-CM | POA: Diagnosis not present

## 2018-04-29 DIAGNOSIS — D509 Iron deficiency anemia, unspecified: Secondary | ICD-10-CM | POA: Diagnosis not present

## 2018-04-30 DIAGNOSIS — N186 End stage renal disease: Secondary | ICD-10-CM | POA: Diagnosis not present

## 2018-04-30 DIAGNOSIS — Z992 Dependence on renal dialysis: Secondary | ICD-10-CM | POA: Diagnosis not present

## 2018-04-30 DIAGNOSIS — N2581 Secondary hyperparathyroidism of renal origin: Secondary | ICD-10-CM | POA: Insufficient documentation

## 2018-04-30 DIAGNOSIS — Z23 Encounter for immunization: Secondary | ICD-10-CM | POA: Diagnosis not present

## 2018-04-30 DIAGNOSIS — D509 Iron deficiency anemia, unspecified: Secondary | ICD-10-CM | POA: Diagnosis not present

## 2018-05-01 DIAGNOSIS — Z992 Dependence on renal dialysis: Secondary | ICD-10-CM | POA: Diagnosis not present

## 2018-05-01 DIAGNOSIS — N186 End stage renal disease: Secondary | ICD-10-CM | POA: Diagnosis not present

## 2018-05-01 DIAGNOSIS — D509 Iron deficiency anemia, unspecified: Secondary | ICD-10-CM | POA: Diagnosis not present

## 2018-05-01 DIAGNOSIS — Z23 Encounter for immunization: Secondary | ICD-10-CM | POA: Diagnosis not present

## 2018-05-02 DIAGNOSIS — Z87891 Personal history of nicotine dependence: Secondary | ICD-10-CM | POA: Diagnosis not present

## 2018-05-02 DIAGNOSIS — I120 Hypertensive chronic kidney disease with stage 5 chronic kidney disease or end stage renal disease: Secondary | ICD-10-CM | POA: Diagnosis not present

## 2018-05-02 DIAGNOSIS — E1122 Type 2 diabetes mellitus with diabetic chronic kidney disease: Secondary | ICD-10-CM | POA: Diagnosis not present

## 2018-05-02 DIAGNOSIS — T85691A Other mechanical complication of intraperitoneal dialysis catheter, initial encounter: Secondary | ICD-10-CM | POA: Diagnosis not present

## 2018-05-02 DIAGNOSIS — Z23 Encounter for immunization: Secondary | ICD-10-CM | POA: Diagnosis not present

## 2018-05-02 DIAGNOSIS — Z4902 Encounter for fitting and adjustment of peritoneal dialysis catheter: Secondary | ICD-10-CM | POA: Diagnosis not present

## 2018-05-02 DIAGNOSIS — D509 Iron deficiency anemia, unspecified: Secondary | ICD-10-CM | POA: Diagnosis not present

## 2018-05-02 DIAGNOSIS — Z992 Dependence on renal dialysis: Secondary | ICD-10-CM | POA: Diagnosis not present

## 2018-05-02 DIAGNOSIS — N189 Chronic kidney disease, unspecified: Secondary | ICD-10-CM | POA: Diagnosis not present

## 2018-05-02 DIAGNOSIS — N185 Chronic kidney disease, stage 5: Secondary | ICD-10-CM | POA: Diagnosis not present

## 2018-05-02 DIAGNOSIS — N186 End stage renal disease: Secondary | ICD-10-CM | POA: Diagnosis not present

## 2018-05-02 DIAGNOSIS — T85848A Pain due to other internal prosthetic devices, implants and grafts, initial encounter: Secondary | ICD-10-CM | POA: Diagnosis not present

## 2018-05-02 DIAGNOSIS — T85611A Breakdown (mechanical) of intraperitoneal dialysis catheter, initial encounter: Secondary | ICD-10-CM | POA: Diagnosis not present

## 2018-05-03 DIAGNOSIS — Z23 Encounter for immunization: Secondary | ICD-10-CM | POA: Diagnosis not present

## 2018-05-03 DIAGNOSIS — D509 Iron deficiency anemia, unspecified: Secondary | ICD-10-CM | POA: Diagnosis not present

## 2018-05-03 DIAGNOSIS — N186 End stage renal disease: Secondary | ICD-10-CM | POA: Diagnosis not present

## 2018-05-03 DIAGNOSIS — Z992 Dependence on renal dialysis: Secondary | ICD-10-CM | POA: Diagnosis not present

## 2018-05-04 DIAGNOSIS — Z992 Dependence on renal dialysis: Secondary | ICD-10-CM | POA: Diagnosis not present

## 2018-05-04 DIAGNOSIS — D509 Iron deficiency anemia, unspecified: Secondary | ICD-10-CM | POA: Diagnosis not present

## 2018-05-04 DIAGNOSIS — N186 End stage renal disease: Secondary | ICD-10-CM | POA: Diagnosis not present

## 2018-05-04 DIAGNOSIS — Z23 Encounter for immunization: Secondary | ICD-10-CM | POA: Diagnosis not present

## 2018-05-05 DIAGNOSIS — D509 Iron deficiency anemia, unspecified: Secondary | ICD-10-CM | POA: Diagnosis not present

## 2018-05-05 DIAGNOSIS — Z23 Encounter for immunization: Secondary | ICD-10-CM | POA: Diagnosis not present

## 2018-05-05 DIAGNOSIS — N186 End stage renal disease: Secondary | ICD-10-CM | POA: Diagnosis not present

## 2018-05-05 DIAGNOSIS — Z992 Dependence on renal dialysis: Secondary | ICD-10-CM | POA: Diagnosis not present

## 2018-05-06 DIAGNOSIS — D509 Iron deficiency anemia, unspecified: Secondary | ICD-10-CM | POA: Diagnosis not present

## 2018-05-06 DIAGNOSIS — Z23 Encounter for immunization: Secondary | ICD-10-CM | POA: Diagnosis not present

## 2018-05-06 DIAGNOSIS — N186 End stage renal disease: Secondary | ICD-10-CM | POA: Diagnosis not present

## 2018-05-06 DIAGNOSIS — Z992 Dependence on renal dialysis: Secondary | ICD-10-CM | POA: Diagnosis not present

## 2018-05-07 DIAGNOSIS — Z23 Encounter for immunization: Secondary | ICD-10-CM | POA: Diagnosis not present

## 2018-05-07 DIAGNOSIS — Z992 Dependence on renal dialysis: Secondary | ICD-10-CM | POA: Diagnosis not present

## 2018-05-07 DIAGNOSIS — N186 End stage renal disease: Secondary | ICD-10-CM | POA: Diagnosis not present

## 2018-05-07 DIAGNOSIS — D509 Iron deficiency anemia, unspecified: Secondary | ICD-10-CM | POA: Diagnosis not present

## 2018-05-08 DIAGNOSIS — Z23 Encounter for immunization: Secondary | ICD-10-CM | POA: Diagnosis not present

## 2018-05-08 DIAGNOSIS — Z992 Dependence on renal dialysis: Secondary | ICD-10-CM | POA: Diagnosis not present

## 2018-05-08 DIAGNOSIS — N186 End stage renal disease: Secondary | ICD-10-CM | POA: Diagnosis not present

## 2018-05-08 DIAGNOSIS — D509 Iron deficiency anemia, unspecified: Secondary | ICD-10-CM | POA: Diagnosis not present

## 2018-05-09 DIAGNOSIS — N186 End stage renal disease: Secondary | ICD-10-CM | POA: Diagnosis not present

## 2018-05-09 DIAGNOSIS — Z23 Encounter for immunization: Secondary | ICD-10-CM | POA: Diagnosis not present

## 2018-05-09 DIAGNOSIS — Z992 Dependence on renal dialysis: Secondary | ICD-10-CM | POA: Diagnosis not present

## 2018-05-09 DIAGNOSIS — D509 Iron deficiency anemia, unspecified: Secondary | ICD-10-CM | POA: Diagnosis not present

## 2018-05-10 DIAGNOSIS — Z992 Dependence on renal dialysis: Secondary | ICD-10-CM | POA: Diagnosis not present

## 2018-05-10 DIAGNOSIS — D509 Iron deficiency anemia, unspecified: Secondary | ICD-10-CM | POA: Diagnosis not present

## 2018-05-10 DIAGNOSIS — Z23 Encounter for immunization: Secondary | ICD-10-CM | POA: Diagnosis not present

## 2018-05-10 DIAGNOSIS — N186 End stage renal disease: Secondary | ICD-10-CM | POA: Diagnosis not present

## 2018-05-11 DIAGNOSIS — N186 End stage renal disease: Secondary | ICD-10-CM | POA: Diagnosis not present

## 2018-05-11 DIAGNOSIS — Z992 Dependence on renal dialysis: Secondary | ICD-10-CM | POA: Diagnosis not present

## 2018-05-11 DIAGNOSIS — D509 Iron deficiency anemia, unspecified: Secondary | ICD-10-CM | POA: Diagnosis not present

## 2018-05-11 DIAGNOSIS — Z23 Encounter for immunization: Secondary | ICD-10-CM | POA: Diagnosis not present

## 2018-05-12 DIAGNOSIS — Z23 Encounter for immunization: Secondary | ICD-10-CM | POA: Diagnosis not present

## 2018-05-12 DIAGNOSIS — Z992 Dependence on renal dialysis: Secondary | ICD-10-CM | POA: Diagnosis not present

## 2018-05-12 DIAGNOSIS — N186 End stage renal disease: Secondary | ICD-10-CM | POA: Diagnosis not present

## 2018-05-12 DIAGNOSIS — D509 Iron deficiency anemia, unspecified: Secondary | ICD-10-CM | POA: Diagnosis not present

## 2018-05-13 DIAGNOSIS — Z23 Encounter for immunization: Secondary | ICD-10-CM | POA: Diagnosis not present

## 2018-05-13 DIAGNOSIS — D509 Iron deficiency anemia, unspecified: Secondary | ICD-10-CM | POA: Diagnosis not present

## 2018-05-13 DIAGNOSIS — N186 End stage renal disease: Secondary | ICD-10-CM | POA: Diagnosis not present

## 2018-05-13 DIAGNOSIS — Z992 Dependence on renal dialysis: Secondary | ICD-10-CM | POA: Diagnosis not present

## 2018-05-14 DIAGNOSIS — N186 End stage renal disease: Secondary | ICD-10-CM | POA: Diagnosis not present

## 2018-05-14 DIAGNOSIS — Z992 Dependence on renal dialysis: Secondary | ICD-10-CM | POA: Diagnosis not present

## 2018-05-14 DIAGNOSIS — Z23 Encounter for immunization: Secondary | ICD-10-CM | POA: Diagnosis not present

## 2018-05-14 DIAGNOSIS — D509 Iron deficiency anemia, unspecified: Secondary | ICD-10-CM | POA: Diagnosis not present

## 2018-05-15 DIAGNOSIS — Z992 Dependence on renal dialysis: Secondary | ICD-10-CM | POA: Diagnosis not present

## 2018-05-15 DIAGNOSIS — N186 End stage renal disease: Secondary | ICD-10-CM | POA: Diagnosis not present

## 2018-05-15 DIAGNOSIS — Z23 Encounter for immunization: Secondary | ICD-10-CM | POA: Diagnosis not present

## 2018-05-15 DIAGNOSIS — D509 Iron deficiency anemia, unspecified: Secondary | ICD-10-CM | POA: Diagnosis not present

## 2018-05-16 DIAGNOSIS — N186 End stage renal disease: Secondary | ICD-10-CM | POA: Diagnosis not present

## 2018-05-16 DIAGNOSIS — D509 Iron deficiency anemia, unspecified: Secondary | ICD-10-CM | POA: Diagnosis not present

## 2018-05-16 DIAGNOSIS — Z992 Dependence on renal dialysis: Secondary | ICD-10-CM | POA: Diagnosis not present

## 2018-05-16 DIAGNOSIS — Z23 Encounter for immunization: Secondary | ICD-10-CM | POA: Diagnosis not present

## 2018-05-17 DIAGNOSIS — N186 End stage renal disease: Secondary | ICD-10-CM | POA: Diagnosis not present

## 2018-05-17 DIAGNOSIS — Z992 Dependence on renal dialysis: Secondary | ICD-10-CM | POA: Diagnosis not present

## 2018-05-17 DIAGNOSIS — D509 Iron deficiency anemia, unspecified: Secondary | ICD-10-CM | POA: Diagnosis not present

## 2018-05-17 DIAGNOSIS — Z23 Encounter for immunization: Secondary | ICD-10-CM | POA: Diagnosis not present

## 2018-05-18 DIAGNOSIS — Z23 Encounter for immunization: Secondary | ICD-10-CM | POA: Diagnosis not present

## 2018-05-18 DIAGNOSIS — Z992 Dependence on renal dialysis: Secondary | ICD-10-CM | POA: Diagnosis not present

## 2018-05-18 DIAGNOSIS — N186 End stage renal disease: Secondary | ICD-10-CM | POA: Diagnosis not present

## 2018-05-18 DIAGNOSIS — D509 Iron deficiency anemia, unspecified: Secondary | ICD-10-CM | POA: Diagnosis not present

## 2018-05-19 DIAGNOSIS — Z23 Encounter for immunization: Secondary | ICD-10-CM | POA: Diagnosis not present

## 2018-05-19 DIAGNOSIS — N186 End stage renal disease: Secondary | ICD-10-CM | POA: Diagnosis not present

## 2018-05-19 DIAGNOSIS — D509 Iron deficiency anemia, unspecified: Secondary | ICD-10-CM | POA: Diagnosis not present

## 2018-05-19 DIAGNOSIS — Z992 Dependence on renal dialysis: Secondary | ICD-10-CM | POA: Diagnosis not present

## 2018-05-20 DIAGNOSIS — Z23 Encounter for immunization: Secondary | ICD-10-CM | POA: Diagnosis not present

## 2018-05-20 DIAGNOSIS — D509 Iron deficiency anemia, unspecified: Secondary | ICD-10-CM | POA: Diagnosis not present

## 2018-05-20 DIAGNOSIS — Z992 Dependence on renal dialysis: Secondary | ICD-10-CM | POA: Diagnosis not present

## 2018-05-20 DIAGNOSIS — N186 End stage renal disease: Secondary | ICD-10-CM | POA: Diagnosis not present

## 2018-05-21 DIAGNOSIS — D509 Iron deficiency anemia, unspecified: Secondary | ICD-10-CM | POA: Diagnosis not present

## 2018-05-21 DIAGNOSIS — N186 End stage renal disease: Secondary | ICD-10-CM | POA: Diagnosis not present

## 2018-05-21 DIAGNOSIS — Z23 Encounter for immunization: Secondary | ICD-10-CM | POA: Diagnosis not present

## 2018-05-21 DIAGNOSIS — Z992 Dependence on renal dialysis: Secondary | ICD-10-CM | POA: Diagnosis not present

## 2018-05-22 DIAGNOSIS — N186 End stage renal disease: Secondary | ICD-10-CM | POA: Diagnosis not present

## 2018-05-22 DIAGNOSIS — Z23 Encounter for immunization: Secondary | ICD-10-CM | POA: Diagnosis not present

## 2018-05-22 DIAGNOSIS — D509 Iron deficiency anemia, unspecified: Secondary | ICD-10-CM | POA: Diagnosis not present

## 2018-05-22 DIAGNOSIS — Z992 Dependence on renal dialysis: Secondary | ICD-10-CM | POA: Diagnosis not present

## 2018-05-23 DIAGNOSIS — E663 Overweight: Secondary | ICD-10-CM | POA: Diagnosis not present

## 2018-05-23 DIAGNOSIS — Z23 Encounter for immunization: Secondary | ICD-10-CM | POA: Diagnosis not present

## 2018-05-23 DIAGNOSIS — I340 Nonrheumatic mitral (valve) insufficiency: Secondary | ICD-10-CM | POA: Diagnosis not present

## 2018-05-23 DIAGNOSIS — R0602 Shortness of breath: Secondary | ICD-10-CM | POA: Diagnosis not present

## 2018-05-23 DIAGNOSIS — I10 Essential (primary) hypertension: Secondary | ICD-10-CM | POA: Diagnosis not present

## 2018-05-23 DIAGNOSIS — I351 Nonrheumatic aortic (valve) insufficiency: Secondary | ICD-10-CM | POA: Diagnosis not present

## 2018-05-23 DIAGNOSIS — I4891 Unspecified atrial fibrillation: Secondary | ICD-10-CM | POA: Diagnosis not present

## 2018-05-23 DIAGNOSIS — E785 Hyperlipidemia, unspecified: Secondary | ICD-10-CM | POA: Diagnosis not present

## 2018-05-23 DIAGNOSIS — Z9861 Coronary angioplasty status: Secondary | ICD-10-CM | POA: Diagnosis not present

## 2018-05-23 DIAGNOSIS — Z992 Dependence on renal dialysis: Secondary | ICD-10-CM | POA: Diagnosis not present

## 2018-05-23 DIAGNOSIS — N186 End stage renal disease: Secondary | ICD-10-CM | POA: Diagnosis not present

## 2018-05-23 DIAGNOSIS — I25810 Atherosclerosis of coronary artery bypass graft(s) without angina pectoris: Secondary | ICD-10-CM | POA: Diagnosis not present

## 2018-05-23 DIAGNOSIS — I255 Ischemic cardiomyopathy: Secondary | ICD-10-CM | POA: Diagnosis not present

## 2018-05-23 DIAGNOSIS — D509 Iron deficiency anemia, unspecified: Secondary | ICD-10-CM | POA: Diagnosis not present

## 2018-05-23 DIAGNOSIS — I2510 Atherosclerotic heart disease of native coronary artery without angina pectoris: Secondary | ICD-10-CM | POA: Diagnosis not present

## 2018-05-24 DIAGNOSIS — N186 End stage renal disease: Secondary | ICD-10-CM | POA: Diagnosis not present

## 2018-05-24 DIAGNOSIS — Z992 Dependence on renal dialysis: Secondary | ICD-10-CM | POA: Diagnosis not present

## 2018-05-24 DIAGNOSIS — D509 Iron deficiency anemia, unspecified: Secondary | ICD-10-CM | POA: Diagnosis not present

## 2018-05-24 DIAGNOSIS — Z23 Encounter for immunization: Secondary | ICD-10-CM | POA: Diagnosis not present

## 2018-05-24 DIAGNOSIS — N2581 Secondary hyperparathyroidism of renal origin: Secondary | ICD-10-CM | POA: Diagnosis not present

## 2018-05-30 DIAGNOSIS — E119 Type 2 diabetes mellitus without complications: Secondary | ICD-10-CM | POA: Diagnosis not present

## 2018-05-30 DIAGNOSIS — Z992 Dependence on renal dialysis: Secondary | ICD-10-CM | POA: Diagnosis not present

## 2018-06-23 DIAGNOSIS — E1122 Type 2 diabetes mellitus with diabetic chronic kidney disease: Secondary | ICD-10-CM | POA: Diagnosis not present

## 2018-06-23 DIAGNOSIS — I2510 Atherosclerotic heart disease of native coronary artery without angina pectoris: Secondary | ICD-10-CM | POA: Diagnosis not present

## 2018-06-23 DIAGNOSIS — I4891 Unspecified atrial fibrillation: Secondary | ICD-10-CM | POA: Diagnosis not present

## 2018-06-23 DIAGNOSIS — Z7902 Long term (current) use of antithrombotics/antiplatelets: Secondary | ICD-10-CM | POA: Diagnosis not present

## 2018-06-23 DIAGNOSIS — N186 End stage renal disease: Secondary | ICD-10-CM | POA: Diagnosis not present

## 2018-06-23 DIAGNOSIS — I5022 Chronic systolic (congestive) heart failure: Secondary | ICD-10-CM | POA: Diagnosis not present

## 2018-06-23 DIAGNOSIS — Z992 Dependence on renal dialysis: Secondary | ICD-10-CM | POA: Diagnosis not present

## 2018-06-23 DIAGNOSIS — I132 Hypertensive heart and chronic kidney disease with heart failure and with stage 5 chronic kidney disease, or end stage renal disease: Secondary | ICD-10-CM | POA: Diagnosis not present

## 2018-06-23 DIAGNOSIS — Z9181 History of falling: Secondary | ICD-10-CM | POA: Diagnosis not present

## 2018-06-24 DIAGNOSIS — D509 Iron deficiency anemia, unspecified: Secondary | ICD-10-CM | POA: Diagnosis not present

## 2018-06-24 DIAGNOSIS — Z992 Dependence on renal dialysis: Secondary | ICD-10-CM | POA: Diagnosis not present

## 2018-06-24 DIAGNOSIS — Z298 Encounter for other specified prophylactic measures: Secondary | ICD-10-CM | POA: Diagnosis not present

## 2018-06-24 DIAGNOSIS — N186 End stage renal disease: Secondary | ICD-10-CM | POA: Diagnosis not present

## 2018-06-26 DIAGNOSIS — Z992 Dependence on renal dialysis: Secondary | ICD-10-CM | POA: Diagnosis not present

## 2018-07-22 DIAGNOSIS — Z992 Dependence on renal dialysis: Secondary | ICD-10-CM | POA: Diagnosis not present

## 2018-07-22 DIAGNOSIS — N186 End stage renal disease: Secondary | ICD-10-CM | POA: Diagnosis not present

## 2018-07-23 DIAGNOSIS — I2510 Atherosclerotic heart disease of native coronary artery without angina pectoris: Secondary | ICD-10-CM | POA: Diagnosis not present

## 2018-07-23 DIAGNOSIS — Z992 Dependence on renal dialysis: Secondary | ICD-10-CM | POA: Diagnosis not present

## 2018-07-23 DIAGNOSIS — Z7902 Long term (current) use of antithrombotics/antiplatelets: Secondary | ICD-10-CM | POA: Diagnosis not present

## 2018-07-23 DIAGNOSIS — Z9181 History of falling: Secondary | ICD-10-CM | POA: Diagnosis not present

## 2018-07-23 DIAGNOSIS — I5022 Chronic systolic (congestive) heart failure: Secondary | ICD-10-CM | POA: Diagnosis not present

## 2018-07-23 DIAGNOSIS — N186 End stage renal disease: Secondary | ICD-10-CM | POA: Diagnosis not present

## 2018-07-23 DIAGNOSIS — E1122 Type 2 diabetes mellitus with diabetic chronic kidney disease: Secondary | ICD-10-CM | POA: Diagnosis not present

## 2018-07-23 DIAGNOSIS — I4891 Unspecified atrial fibrillation: Secondary | ICD-10-CM | POA: Diagnosis not present

## 2018-07-23 DIAGNOSIS — D509 Iron deficiency anemia, unspecified: Secondary | ICD-10-CM | POA: Diagnosis not present

## 2018-07-23 DIAGNOSIS — I132 Hypertensive heart and chronic kidney disease with heart failure and with stage 5 chronic kidney disease, or end stage renal disease: Secondary | ICD-10-CM | POA: Diagnosis not present

## 2018-07-24 DIAGNOSIS — Z992 Dependence on renal dialysis: Secondary | ICD-10-CM | POA: Diagnosis not present

## 2018-07-27 DIAGNOSIS — E1122 Type 2 diabetes mellitus with diabetic chronic kidney disease: Secondary | ICD-10-CM | POA: Diagnosis not present

## 2018-07-27 DIAGNOSIS — I10 Essential (primary) hypertension: Secondary | ICD-10-CM | POA: Diagnosis not present

## 2018-07-27 DIAGNOSIS — E785 Hyperlipidemia, unspecified: Secondary | ICD-10-CM | POA: Diagnosis not present

## 2018-07-28 DIAGNOSIS — I2510 Atherosclerotic heart disease of native coronary artery without angina pectoris: Secondary | ICD-10-CM | POA: Diagnosis not present

## 2018-07-28 DIAGNOSIS — N186 End stage renal disease: Secondary | ICD-10-CM | POA: Diagnosis not present

## 2018-07-28 DIAGNOSIS — I4891 Unspecified atrial fibrillation: Secondary | ICD-10-CM | POA: Diagnosis not present

## 2018-07-28 DIAGNOSIS — E1122 Type 2 diabetes mellitus with diabetic chronic kidney disease: Secondary | ICD-10-CM | POA: Diagnosis not present

## 2018-07-28 DIAGNOSIS — L89159 Pressure ulcer of sacral region, unspecified stage: Secondary | ICD-10-CM | POA: Diagnosis not present

## 2018-07-28 DIAGNOSIS — D649 Anemia, unspecified: Secondary | ICD-10-CM | POA: Diagnosis not present

## 2018-07-28 DIAGNOSIS — E785 Hyperlipidemia, unspecified: Secondary | ICD-10-CM | POA: Diagnosis not present

## 2018-07-28 DIAGNOSIS — I351 Nonrheumatic aortic (valve) insufficiency: Secondary | ICD-10-CM | POA: Diagnosis not present

## 2018-07-28 DIAGNOSIS — I10 Essential (primary) hypertension: Secondary | ICD-10-CM | POA: Diagnosis not present

## 2018-07-28 DIAGNOSIS — G4700 Insomnia, unspecified: Secondary | ICD-10-CM | POA: Diagnosis not present

## 2018-08-02 DIAGNOSIS — N186 End stage renal disease: Secondary | ICD-10-CM | POA: Diagnosis not present

## 2018-08-02 DIAGNOSIS — E1122 Type 2 diabetes mellitus with diabetic chronic kidney disease: Secondary | ICD-10-CM | POA: Diagnosis not present

## 2018-08-02 DIAGNOSIS — I120 Hypertensive chronic kidney disease with stage 5 chronic kidney disease or end stage renal disease: Secondary | ICD-10-CM | POA: Diagnosis not present

## 2018-08-02 DIAGNOSIS — N2581 Secondary hyperparathyroidism of renal origin: Secondary | ICD-10-CM | POA: Diagnosis not present

## 2018-08-02 DIAGNOSIS — Z992 Dependence on renal dialysis: Secondary | ICD-10-CM | POA: Diagnosis not present

## 2018-08-02 DIAGNOSIS — E782 Mixed hyperlipidemia: Secondary | ICD-10-CM | POA: Diagnosis not present

## 2018-08-22 DIAGNOSIS — N186 End stage renal disease: Secondary | ICD-10-CM | POA: Diagnosis not present

## 2018-08-22 DIAGNOSIS — Z992 Dependence on renal dialysis: Secondary | ICD-10-CM | POA: Diagnosis not present

## 2018-08-23 DIAGNOSIS — Z992 Dependence on renal dialysis: Secondary | ICD-10-CM | POA: Diagnosis not present

## 2018-08-23 DIAGNOSIS — N186 End stage renal disease: Secondary | ICD-10-CM | POA: Diagnosis not present

## 2018-08-23 DIAGNOSIS — E119 Type 2 diabetes mellitus without complications: Secondary | ICD-10-CM | POA: Diagnosis not present

## 2018-08-23 DIAGNOSIS — Z23 Encounter for immunization: Secondary | ICD-10-CM | POA: Diagnosis not present

## 2018-08-23 DIAGNOSIS — D509 Iron deficiency anemia, unspecified: Secondary | ICD-10-CM | POA: Diagnosis not present

## 2018-08-23 DIAGNOSIS — N2581 Secondary hyperparathyroidism of renal origin: Secondary | ICD-10-CM | POA: Diagnosis not present

## 2018-09-21 DIAGNOSIS — N186 End stage renal disease: Secondary | ICD-10-CM | POA: Diagnosis not present

## 2018-09-21 DIAGNOSIS — Z992 Dependence on renal dialysis: Secondary | ICD-10-CM | POA: Diagnosis not present

## 2018-09-21 DIAGNOSIS — I10 Essential (primary) hypertension: Secondary | ICD-10-CM | POA: Diagnosis not present

## 2018-09-21 DIAGNOSIS — E785 Hyperlipidemia, unspecified: Secondary | ICD-10-CM | POA: Diagnosis not present

## 2018-09-21 DIAGNOSIS — I2510 Atherosclerotic heart disease of native coronary artery without angina pectoris: Secondary | ICD-10-CM | POA: Diagnosis not present

## 2018-09-22 DIAGNOSIS — N186 End stage renal disease: Secondary | ICD-10-CM | POA: Diagnosis not present

## 2018-09-22 DIAGNOSIS — Z992 Dependence on renal dialysis: Secondary | ICD-10-CM | POA: Diagnosis not present

## 2018-09-22 DIAGNOSIS — D509 Iron deficiency anemia, unspecified: Secondary | ICD-10-CM | POA: Diagnosis not present

## 2018-09-25 DIAGNOSIS — Z992 Dependence on renal dialysis: Secondary | ICD-10-CM | POA: Diagnosis not present

## 2018-10-22 DIAGNOSIS — Z992 Dependence on renal dialysis: Secondary | ICD-10-CM | POA: Diagnosis not present

## 2018-10-22 DIAGNOSIS — N186 End stage renal disease: Secondary | ICD-10-CM | POA: Diagnosis not present

## 2018-10-23 DIAGNOSIS — Z992 Dependence on renal dialysis: Secondary | ICD-10-CM | POA: Diagnosis not present

## 2018-10-23 DIAGNOSIS — N186 End stage renal disease: Secondary | ICD-10-CM | POA: Diagnosis not present

## 2018-10-23 DIAGNOSIS — D509 Iron deficiency anemia, unspecified: Secondary | ICD-10-CM | POA: Diagnosis not present

## 2018-10-24 DIAGNOSIS — Z992 Dependence on renal dialysis: Secondary | ICD-10-CM | POA: Diagnosis not present

## 2018-11-06 DIAGNOSIS — I10 Essential (primary) hypertension: Secondary | ICD-10-CM | POA: Diagnosis not present

## 2018-11-06 DIAGNOSIS — I120 Hypertensive chronic kidney disease with stage 5 chronic kidney disease or end stage renal disease: Secondary | ICD-10-CM | POA: Diagnosis not present

## 2018-11-06 DIAGNOSIS — N2581 Secondary hyperparathyroidism of renal origin: Secondary | ICD-10-CM | POA: Diagnosis not present

## 2018-11-06 DIAGNOSIS — E1122 Type 2 diabetes mellitus with diabetic chronic kidney disease: Secondary | ICD-10-CM | POA: Diagnosis not present

## 2018-11-06 DIAGNOSIS — N186 End stage renal disease: Secondary | ICD-10-CM | POA: Diagnosis not present

## 2018-11-06 DIAGNOSIS — Z992 Dependence on renal dialysis: Secondary | ICD-10-CM | POA: Diagnosis not present

## 2018-11-06 DIAGNOSIS — E782 Mixed hyperlipidemia: Secondary | ICD-10-CM | POA: Diagnosis not present

## 2018-11-21 DIAGNOSIS — N186 End stage renal disease: Secondary | ICD-10-CM | POA: Diagnosis not present

## 2018-11-21 DIAGNOSIS — Z992 Dependence on renal dialysis: Secondary | ICD-10-CM | POA: Diagnosis not present

## 2018-11-22 DIAGNOSIS — Z992 Dependence on renal dialysis: Secondary | ICD-10-CM | POA: Diagnosis not present

## 2018-11-22 DIAGNOSIS — N186 End stage renal disease: Secondary | ICD-10-CM | POA: Diagnosis not present

## 2018-11-22 DIAGNOSIS — D509 Iron deficiency anemia, unspecified: Secondary | ICD-10-CM | POA: Diagnosis not present

## 2018-11-22 DIAGNOSIS — N2581 Secondary hyperparathyroidism of renal origin: Secondary | ICD-10-CM | POA: Diagnosis not present

## 2018-11-28 DIAGNOSIS — Z992 Dependence on renal dialysis: Secondary | ICD-10-CM | POA: Diagnosis not present

## 2018-11-28 DIAGNOSIS — E119 Type 2 diabetes mellitus without complications: Secondary | ICD-10-CM | POA: Diagnosis not present

## 2018-11-29 DIAGNOSIS — E1122 Type 2 diabetes mellitus with diabetic chronic kidney disease: Secondary | ICD-10-CM | POA: Diagnosis not present

## 2018-12-01 DIAGNOSIS — G4700 Insomnia, unspecified: Secondary | ICD-10-CM | POA: Diagnosis not present

## 2018-12-01 DIAGNOSIS — D649 Anemia, unspecified: Secondary | ICD-10-CM | POA: Diagnosis not present

## 2018-12-01 DIAGNOSIS — I4891 Unspecified atrial fibrillation: Secondary | ICD-10-CM | POA: Diagnosis not present

## 2018-12-01 DIAGNOSIS — I10 Essential (primary) hypertension: Secondary | ICD-10-CM | POA: Diagnosis not present

## 2018-12-01 DIAGNOSIS — E785 Hyperlipidemia, unspecified: Secondary | ICD-10-CM | POA: Diagnosis not present

## 2018-12-01 DIAGNOSIS — L89159 Pressure ulcer of sacral region, unspecified stage: Secondary | ICD-10-CM | POA: Diagnosis not present

## 2018-12-01 DIAGNOSIS — I351 Nonrheumatic aortic (valve) insufficiency: Secondary | ICD-10-CM | POA: Diagnosis not present

## 2018-12-01 DIAGNOSIS — N186 End stage renal disease: Secondary | ICD-10-CM | POA: Diagnosis not present

## 2018-12-01 DIAGNOSIS — I2510 Atherosclerotic heart disease of native coronary artery without angina pectoris: Secondary | ICD-10-CM | POA: Diagnosis not present

## 2018-12-01 DIAGNOSIS — E1122 Type 2 diabetes mellitus with diabetic chronic kidney disease: Secondary | ICD-10-CM | POA: Diagnosis not present

## 2018-12-22 DIAGNOSIS — Z992 Dependence on renal dialysis: Secondary | ICD-10-CM | POA: Diagnosis not present

## 2018-12-22 DIAGNOSIS — N186 End stage renal disease: Secondary | ICD-10-CM | POA: Diagnosis not present

## 2018-12-23 DIAGNOSIS — N186 End stage renal disease: Secondary | ICD-10-CM | POA: Diagnosis not present

## 2018-12-23 DIAGNOSIS — Z992 Dependence on renal dialysis: Secondary | ICD-10-CM | POA: Diagnosis not present

## 2018-12-23 DIAGNOSIS — D509 Iron deficiency anemia, unspecified: Secondary | ICD-10-CM | POA: Diagnosis not present

## 2018-12-25 DIAGNOSIS — Z992 Dependence on renal dialysis: Secondary | ICD-10-CM | POA: Diagnosis not present

## 2018-12-27 DIAGNOSIS — M25461 Effusion, right knee: Secondary | ICD-10-CM | POA: Diagnosis not present

## 2018-12-27 DIAGNOSIS — M170 Bilateral primary osteoarthritis of knee: Secondary | ICD-10-CM | POA: Diagnosis not present

## 2018-12-27 DIAGNOSIS — M1711 Unilateral primary osteoarthritis, right knee: Secondary | ICD-10-CM | POA: Diagnosis not present

## 2018-12-27 DIAGNOSIS — M25562 Pain in left knee: Secondary | ICD-10-CM | POA: Diagnosis not present

## 2018-12-27 DIAGNOSIS — M25561 Pain in right knee: Secondary | ICD-10-CM | POA: Diagnosis not present

## 2018-12-28 DIAGNOSIS — M1712 Unilateral primary osteoarthritis, left knee: Secondary | ICD-10-CM | POA: Diagnosis not present

## 2018-12-28 DIAGNOSIS — M25562 Pain in left knee: Secondary | ICD-10-CM | POA: Diagnosis not present

## 2019-01-03 DIAGNOSIS — M1711 Unilateral primary osteoarthritis, right knee: Secondary | ICD-10-CM | POA: Diagnosis not present

## 2019-01-03 DIAGNOSIS — M25561 Pain in right knee: Secondary | ICD-10-CM | POA: Diagnosis not present

## 2019-01-04 DIAGNOSIS — M1712 Unilateral primary osteoarthritis, left knee: Secondary | ICD-10-CM | POA: Diagnosis not present

## 2019-01-04 DIAGNOSIS — M25562 Pain in left knee: Secondary | ICD-10-CM | POA: Diagnosis not present

## 2019-01-10 DIAGNOSIS — M1711 Unilateral primary osteoarthritis, right knee: Secondary | ICD-10-CM | POA: Diagnosis not present

## 2019-01-10 DIAGNOSIS — M25561 Pain in right knee: Secondary | ICD-10-CM | POA: Diagnosis not present

## 2019-01-11 DIAGNOSIS — M1712 Unilateral primary osteoarthritis, left knee: Secondary | ICD-10-CM | POA: Diagnosis not present

## 2019-01-11 DIAGNOSIS — M25562 Pain in left knee: Secondary | ICD-10-CM | POA: Diagnosis not present

## 2019-01-17 DIAGNOSIS — M170 Bilateral primary osteoarthritis of knee: Secondary | ICD-10-CM | POA: Diagnosis not present

## 2019-01-17 DIAGNOSIS — M25561 Pain in right knee: Secondary | ICD-10-CM | POA: Diagnosis not present

## 2019-01-17 DIAGNOSIS — M25562 Pain in left knee: Secondary | ICD-10-CM | POA: Diagnosis not present

## 2019-01-22 DIAGNOSIS — Z992 Dependence on renal dialysis: Secondary | ICD-10-CM | POA: Diagnosis not present

## 2019-01-22 DIAGNOSIS — E663 Overweight: Secondary | ICD-10-CM | POA: Diagnosis not present

## 2019-01-22 DIAGNOSIS — E785 Hyperlipidemia, unspecified: Secondary | ICD-10-CM | POA: Diagnosis not present

## 2019-01-22 DIAGNOSIS — R0602 Shortness of breath: Secondary | ICD-10-CM | POA: Diagnosis not present

## 2019-01-22 DIAGNOSIS — Z9861 Coronary angioplasty status: Secondary | ICD-10-CM | POA: Diagnosis not present

## 2019-01-22 DIAGNOSIS — N186 End stage renal disease: Secondary | ICD-10-CM | POA: Diagnosis not present

## 2019-01-22 DIAGNOSIS — I25810 Atherosclerosis of coronary artery bypass graft(s) without angina pectoris: Secondary | ICD-10-CM | POA: Diagnosis not present

## 2019-01-22 DIAGNOSIS — I10 Essential (primary) hypertension: Secondary | ICD-10-CM | POA: Diagnosis not present

## 2019-01-22 DIAGNOSIS — I2510 Atherosclerotic heart disease of native coronary artery without angina pectoris: Secondary | ICD-10-CM | POA: Diagnosis not present

## 2019-01-23 DIAGNOSIS — D509 Iron deficiency anemia, unspecified: Secondary | ICD-10-CM | POA: Diagnosis not present

## 2019-01-23 DIAGNOSIS — N186 End stage renal disease: Secondary | ICD-10-CM | POA: Diagnosis not present

## 2019-01-23 DIAGNOSIS — Z992 Dependence on renal dialysis: Secondary | ICD-10-CM | POA: Diagnosis not present

## 2019-01-24 DIAGNOSIS — M170 Bilateral primary osteoarthritis of knee: Secondary | ICD-10-CM | POA: Diagnosis not present

## 2019-01-24 DIAGNOSIS — M25562 Pain in left knee: Secondary | ICD-10-CM | POA: Diagnosis not present

## 2019-01-24 DIAGNOSIS — M25561 Pain in right knee: Secondary | ICD-10-CM | POA: Diagnosis not present

## 2019-01-30 DIAGNOSIS — Z992 Dependence on renal dialysis: Secondary | ICD-10-CM | POA: Diagnosis not present

## 2019-02-13 DIAGNOSIS — Z23 Encounter for immunization: Secondary | ICD-10-CM | POA: Diagnosis not present

## 2019-02-14 DIAGNOSIS — Z992 Dependence on renal dialysis: Secondary | ICD-10-CM | POA: Diagnosis not present

## 2019-02-14 DIAGNOSIS — N186 End stage renal disease: Secondary | ICD-10-CM | POA: Diagnosis not present

## 2019-02-14 DIAGNOSIS — N2581 Secondary hyperparathyroidism of renal origin: Secondary | ICD-10-CM | POA: Diagnosis not present

## 2019-02-14 DIAGNOSIS — E1122 Type 2 diabetes mellitus with diabetic chronic kidney disease: Secondary | ICD-10-CM | POA: Diagnosis not present

## 2019-02-14 DIAGNOSIS — I10 Essential (primary) hypertension: Secondary | ICD-10-CM | POA: Diagnosis not present

## 2019-02-14 DIAGNOSIS — E782 Mixed hyperlipidemia: Secondary | ICD-10-CM | POA: Diagnosis not present

## 2019-02-14 DIAGNOSIS — I120 Hypertensive chronic kidney disease with stage 5 chronic kidney disease or end stage renal disease: Secondary | ICD-10-CM | POA: Diagnosis not present

## 2019-02-15 DIAGNOSIS — Z23 Encounter for immunization: Secondary | ICD-10-CM | POA: Diagnosis not present

## 2019-02-21 DIAGNOSIS — N186 End stage renal disease: Secondary | ICD-10-CM | POA: Diagnosis not present

## 2019-02-21 DIAGNOSIS — Z992 Dependence on renal dialysis: Secondary | ICD-10-CM | POA: Diagnosis not present

## 2019-02-22 DIAGNOSIS — N2581 Secondary hyperparathyroidism of renal origin: Secondary | ICD-10-CM | POA: Diagnosis not present

## 2019-02-22 DIAGNOSIS — N186 End stage renal disease: Secondary | ICD-10-CM | POA: Diagnosis not present

## 2019-02-22 DIAGNOSIS — Z992 Dependence on renal dialysis: Secondary | ICD-10-CM | POA: Diagnosis not present

## 2019-02-22 DIAGNOSIS — D509 Iron deficiency anemia, unspecified: Secondary | ICD-10-CM | POA: Diagnosis not present

## 2019-02-27 DIAGNOSIS — E119 Type 2 diabetes mellitus without complications: Secondary | ICD-10-CM | POA: Diagnosis not present

## 2019-02-27 DIAGNOSIS — I259 Chronic ischemic heart disease, unspecified: Secondary | ICD-10-CM | POA: Diagnosis not present

## 2019-02-27 DIAGNOSIS — Z992 Dependence on renal dialysis: Secondary | ICD-10-CM | POA: Diagnosis not present

## 2019-03-24 DIAGNOSIS — Z992 Dependence on renal dialysis: Secondary | ICD-10-CM | POA: Diagnosis not present

## 2019-03-24 DIAGNOSIS — N186 End stage renal disease: Secondary | ICD-10-CM | POA: Diagnosis not present

## 2019-03-25 DIAGNOSIS — D509 Iron deficiency anemia, unspecified: Secondary | ICD-10-CM | POA: Diagnosis not present

## 2019-03-25 DIAGNOSIS — N186 End stage renal disease: Secondary | ICD-10-CM | POA: Diagnosis not present

## 2019-03-25 DIAGNOSIS — Z992 Dependence on renal dialysis: Secondary | ICD-10-CM | POA: Diagnosis not present

## 2019-03-29 DIAGNOSIS — E1122 Type 2 diabetes mellitus with diabetic chronic kidney disease: Secondary | ICD-10-CM | POA: Diagnosis not present

## 2019-04-02 DIAGNOSIS — E119 Type 2 diabetes mellitus without complications: Secondary | ICD-10-CM | POA: Diagnosis not present

## 2019-04-03 DIAGNOSIS — D649 Anemia, unspecified: Secondary | ICD-10-CM | POA: Diagnosis not present

## 2019-04-03 DIAGNOSIS — G4700 Insomnia, unspecified: Secondary | ICD-10-CM | POA: Diagnosis not present

## 2019-04-03 DIAGNOSIS — E1122 Type 2 diabetes mellitus with diabetic chronic kidney disease: Secondary | ICD-10-CM | POA: Diagnosis not present

## 2019-04-03 DIAGNOSIS — E785 Hyperlipidemia, unspecified: Secondary | ICD-10-CM | POA: Diagnosis not present

## 2019-04-03 DIAGNOSIS — J309 Allergic rhinitis, unspecified: Secondary | ICD-10-CM | POA: Diagnosis not present

## 2019-04-03 DIAGNOSIS — I10 Essential (primary) hypertension: Secondary | ICD-10-CM | POA: Diagnosis not present

## 2019-04-03 DIAGNOSIS — I4891 Unspecified atrial fibrillation: Secondary | ICD-10-CM | POA: Diagnosis not present

## 2019-04-03 DIAGNOSIS — I351 Nonrheumatic aortic (valve) insufficiency: Secondary | ICD-10-CM | POA: Diagnosis not present

## 2019-04-03 DIAGNOSIS — I2510 Atherosclerotic heart disease of native coronary artery without angina pectoris: Secondary | ICD-10-CM | POA: Diagnosis not present

## 2019-04-03 DIAGNOSIS — N186 End stage renal disease: Secondary | ICD-10-CM | POA: Diagnosis not present

## 2019-04-03 DIAGNOSIS — L89159 Pressure ulcer of sacral region, unspecified stage: Secondary | ICD-10-CM | POA: Diagnosis not present

## 2019-04-04 DIAGNOSIS — Z992 Dependence on renal dialysis: Secondary | ICD-10-CM | POA: Diagnosis not present

## 2019-04-23 DIAGNOSIS — N186 End stage renal disease: Secondary | ICD-10-CM | POA: Diagnosis not present

## 2019-04-23 DIAGNOSIS — Z992 Dependence on renal dialysis: Secondary | ICD-10-CM | POA: Diagnosis not present

## 2019-04-24 DIAGNOSIS — D509 Iron deficiency anemia, unspecified: Secondary | ICD-10-CM | POA: Diagnosis not present

## 2019-04-24 DIAGNOSIS — Z992 Dependence on renal dialysis: Secondary | ICD-10-CM | POA: Diagnosis not present

## 2019-04-24 DIAGNOSIS — N186 End stage renal disease: Secondary | ICD-10-CM | POA: Diagnosis not present

## 2019-04-24 DIAGNOSIS — N2581 Secondary hyperparathyroidism of renal origin: Secondary | ICD-10-CM | POA: Diagnosis not present

## 2019-04-24 DIAGNOSIS — D631 Anemia in chronic kidney disease: Secondary | ICD-10-CM | POA: Diagnosis not present

## 2019-05-23 DIAGNOSIS — R110 Nausea: Secondary | ICD-10-CM | POA: Diagnosis not present

## 2019-05-23 DIAGNOSIS — I10 Essential (primary) hypertension: Secondary | ICD-10-CM | POA: Diagnosis not present

## 2019-05-24 ENCOUNTER — Other Ambulatory Visit: Payer: Self-pay

## 2019-05-24 ENCOUNTER — Inpatient Hospital Stay
Admission: EM | Admit: 2019-05-24 | Discharge: 2019-05-26 | DRG: 871 | Disposition: A | Discharge status: Home / Self Care | Payer: Medicare Other | Attending: Family Medicine | Admitting: Family Medicine

## 2019-05-24 ENCOUNTER — Emergency Department: Payer: Medicare Other

## 2019-05-24 DIAGNOSIS — D696 Thrombocytopenia, unspecified: Secondary | ICD-10-CM | POA: Diagnosis not present

## 2019-05-24 DIAGNOSIS — N2581 Secondary hyperparathyroidism of renal origin: Secondary | ICD-10-CM | POA: Diagnosis not present

## 2019-05-24 DIAGNOSIS — E1122 Type 2 diabetes mellitus with diabetic chronic kidney disease: Secondary | ICD-10-CM | POA: Diagnosis present

## 2019-05-24 DIAGNOSIS — A419 Sepsis, unspecified organism: Principal | ICD-10-CM | POA: Diagnosis present

## 2019-05-24 DIAGNOSIS — I252 Old myocardial infarction: Secondary | ICD-10-CM | POA: Diagnosis not present

## 2019-05-24 DIAGNOSIS — Z8249 Family history of ischemic heart disease and other diseases of the circulatory system: Secondary | ICD-10-CM

## 2019-05-24 DIAGNOSIS — R509 Fever, unspecified: Secondary | ICD-10-CM | POA: Diagnosis not present

## 2019-05-24 DIAGNOSIS — D631 Anemia in chronic kidney disease: Secondary | ICD-10-CM | POA: Diagnosis present

## 2019-05-24 DIAGNOSIS — Z9842 Cataract extraction status, left eye: Secondary | ICD-10-CM

## 2019-05-24 DIAGNOSIS — Z955 Presence of coronary angioplasty implant and graft: Secondary | ICD-10-CM

## 2019-05-24 DIAGNOSIS — Z992 Dependence on renal dialysis: Secondary | ICD-10-CM | POA: Diagnosis not present

## 2019-05-24 DIAGNOSIS — I120 Hypertensive chronic kidney disease with stage 5 chronic kidney disease or end stage renal disease: Secondary | ICD-10-CM | POA: Diagnosis not present

## 2019-05-24 DIAGNOSIS — Z79899 Other long term (current) drug therapy: Secondary | ICD-10-CM | POA: Diagnosis not present

## 2019-05-24 DIAGNOSIS — I7100 Dissection of unspecified site of aorta: Secondary | ICD-10-CM | POA: Diagnosis not present

## 2019-05-24 DIAGNOSIS — Z87891 Personal history of nicotine dependence: Secondary | ICD-10-CM | POA: Diagnosis not present

## 2019-05-24 DIAGNOSIS — M1990 Unspecified osteoarthritis, unspecified site: Secondary | ICD-10-CM | POA: Diagnosis not present

## 2019-05-24 DIAGNOSIS — R000 Tachycardia, unspecified: Secondary | ICD-10-CM | POA: Diagnosis not present

## 2019-05-24 DIAGNOSIS — Z961 Presence of intraocular lens: Secondary | ICD-10-CM | POA: Diagnosis not present

## 2019-05-24 DIAGNOSIS — K5900 Constipation, unspecified: Secondary | ICD-10-CM | POA: Diagnosis not present

## 2019-05-24 DIAGNOSIS — Z7902 Long term (current) use of antithrombotics/antiplatelets: Secondary | ICD-10-CM

## 2019-05-24 DIAGNOSIS — N186 End stage renal disease: Secondary | ICD-10-CM | POA: Diagnosis present

## 2019-05-24 DIAGNOSIS — I2510 Atherosclerotic heart disease of native coronary artery without angina pectoris: Secondary | ICD-10-CM | POA: Diagnosis not present

## 2019-05-24 DIAGNOSIS — Z20822 Contact with and (suspected) exposure to covid-19: Secondary | ICD-10-CM | POA: Diagnosis not present

## 2019-05-24 DIAGNOSIS — I480 Paroxysmal atrial fibrillation: Secondary | ICD-10-CM | POA: Diagnosis not present

## 2019-05-24 DIAGNOSIS — E785 Hyperlipidemia, unspecified: Secondary | ICD-10-CM | POA: Diagnosis not present

## 2019-05-24 DIAGNOSIS — I10 Essential (primary) hypertension: Secondary | ICD-10-CM | POA: Diagnosis not present

## 2019-05-24 DIAGNOSIS — Z20828 Contact with and (suspected) exposure to other viral communicable diseases: Secondary | ICD-10-CM | POA: Diagnosis not present

## 2019-05-24 DIAGNOSIS — N179 Acute kidney failure, unspecified: Secondary | ICD-10-CM | POA: Diagnosis not present

## 2019-05-24 DIAGNOSIS — Z9841 Cataract extraction status, right eye: Secondary | ICD-10-CM

## 2019-05-24 DIAGNOSIS — R6520 Severe sepsis without septic shock: Secondary | ICD-10-CM | POA: Diagnosis not present

## 2019-05-24 LAB — BASIC METABOLIC PANEL
Anion gap: 10 (ref 5–15)
BUN: 42 mg/dL — ABNORMAL HIGH (ref 8–23)
CO2: 24 mmol/L (ref 22–32)
Calcium: 8.7 mg/dL — ABNORMAL LOW (ref 8.9–10.3)
Chloride: 104 mmol/L (ref 98–111)
Creatinine, Ser: 3.15 mg/dL — ABNORMAL HIGH (ref 0.61–1.24)
GFR calc Af Amer: 20 mL/min — ABNORMAL LOW (ref >60)
GFR calc non Af Amer: 17 mL/min — ABNORMAL LOW (ref >60)
Glucose, Bld: 181 mg/dL — ABNORMAL HIGH (ref 70–99)
Potassium: 4 mmol/L (ref 3.5–5.1)
Sodium: 138 mmol/L (ref 135–145)

## 2019-05-24 LAB — CBC
HCT: 36.7 % — ABNORMAL LOW (ref 39.0–52.0)
Hemoglobin: 11.7 g/dL — ABNORMAL LOW (ref 13.0–17.0)
MCH: 30.5 pg (ref 26.0–34.0)
MCHC: 31.9 g/dL (ref 30.0–36.0)
MCV: 95.6 fL (ref 80.0–100.0)
Platelets: 117 10*3/uL — ABNORMAL LOW (ref 150–400)
RBC: 3.84 MIL/uL — ABNORMAL LOW (ref 4.22–5.81)
RDW: 13.6 % (ref 11.5–15.5)
WBC: 10 10*3/uL (ref 4.0–10.5)
nRBC: 0 % (ref 0.0–0.2)

## 2019-05-24 LAB — PROTIME-INR
INR: 1 (ref 0.8–1.2)
INR: 1 (ref 0.8–1.2)
Prothrombin Time: 12.8 seconds (ref 11.4–15.2)
Prothrombin Time: 13 seconds (ref 11.4–15.2)

## 2019-05-24 LAB — CBC WITH DIFFERENTIAL/PLATELET
Abs Immature Granulocytes: 0.05 10*3/uL (ref 0.00–0.07)
Basophils Absolute: 0.1 10*3/uL (ref 0.0–0.1)
Basophils Relative: 0 %
Eosinophils Absolute: 0.2 10*3/uL (ref 0.0–0.5)
Eosinophils Relative: 1 %
HCT: 41.7 % (ref 39.0–52.0)
Hemoglobin: 13.4 g/dL (ref 13.0–17.0)
Immature Granulocytes: 0 %
Lymphocytes Relative: 3 %
Lymphs Abs: 0.4 10*3/uL — ABNORMAL LOW (ref 0.7–4.0)
MCH: 30.3 pg (ref 26.0–34.0)
MCHC: 32.1 g/dL (ref 30.0–36.0)
MCV: 94.3 fL (ref 80.0–100.0)
Monocytes Absolute: 0.8 10*3/uL (ref 0.1–1.0)
Monocytes Relative: 6 %
Neutro Abs: 11.8 10*3/uL — ABNORMAL HIGH (ref 1.7–7.7)
Neutrophils Relative %: 90 %
Platelets: 132 10*3/uL — ABNORMAL LOW (ref 150–400)
RBC: 4.42 MIL/uL (ref 4.22–5.81)
RDW: 13.2 % (ref 11.5–15.5)
WBC: 13.3 10*3/uL — ABNORMAL HIGH (ref 4.0–10.5)
nRBC: 0 % (ref 0.0–0.2)

## 2019-05-24 LAB — INFLUENZA PANEL BY PCR (TYPE A & B)
Influenza A By PCR: NEGATIVE
Influenza B By PCR: NEGATIVE

## 2019-05-24 LAB — URINALYSIS, ROUTINE W REFLEX MICROSCOPIC
Bacteria, UA: NONE SEEN
Bilirubin Urine: NEGATIVE
Glucose, UA: NEGATIVE mg/dL
Hgb urine dipstick: NEGATIVE
Ketones, ur: NEGATIVE mg/dL
Leukocytes,Ua: NEGATIVE
Nitrite: NEGATIVE
Protein, ur: 30 mg/dL — AB
Specific Gravity, Urine: 1.012 (ref 1.005–1.030)
Squamous Epithelial / HPF: NONE SEEN (ref 0–5)
pH: 6 (ref 5.0–8.0)

## 2019-05-24 LAB — POC SARS CORONAVIRUS 2 AG
SARS Coronavirus 2 Ag: NEGATIVE
SARS Coronavirus 2 Ag: NEGATIVE

## 2019-05-24 LAB — COMPREHENSIVE METABOLIC PANEL
ALT: 37 U/L (ref 0–44)
AST: 40 U/L (ref 15–41)
Albumin: 3.7 g/dL (ref 3.5–5.0)
Alkaline Phosphatase: 112 U/L (ref 38–126)
Anion gap: 15 (ref 5–15)
BUN: 33 mg/dL — ABNORMAL HIGH (ref 8–23)
CO2: 24 mmol/L (ref 22–32)
Calcium: 9.4 mg/dL (ref 8.9–10.3)
Chloride: 101 mmol/L (ref 98–111)
Creatinine, Ser: 2.88 mg/dL — ABNORMAL HIGH (ref 0.61–1.24)
GFR calc Af Amer: 22 mL/min — ABNORMAL LOW (ref >60)
GFR calc non Af Amer: 19 mL/min — ABNORMAL LOW (ref >60)
Glucose, Bld: 178 mg/dL — ABNORMAL HIGH (ref 70–99)
Potassium: 4.3 mmol/L (ref 3.5–5.1)
Sodium: 140 mmol/L (ref 135–145)
Total Bilirubin: 1.3 mg/dL — ABNORMAL HIGH (ref 0.3–1.2)
Total Protein: 7.1 g/dL (ref 6.5–8.1)

## 2019-05-24 LAB — SARS CORONAVIRUS 2 (TAT 6-24 HRS): SARS Coronavirus 2: NEGATIVE

## 2019-05-24 LAB — PROCALCITONIN: Procalcitonin: 2.15 ng/mL

## 2019-05-24 LAB — LACTIC ACID, PLASMA
Lactic Acid, Venous: 2.7 mmol/L (ref 0.5–1.9)
Lactic Acid, Venous: 1.8 mmol/L (ref 0.5–1.9)

## 2019-05-24 LAB — APTT: aPTT: 27 seconds (ref 24–36)

## 2019-05-24 MED ORDER — SODIUM CHLORIDE 0.9 % IV SOLN: 2.0000 g | INTRAVENOUS | Status: DC

## 2019-05-24 MED ORDER — VITAMIN D 25 MCG (1000 UNIT) PO TABS
2000.0000 [IU] | ORAL_TABLET | Freq: Every day | ORAL | Status: DC
  Administered 2019-05-24 – 2019-05-26 (×3): 2000 [IU] via ORAL
  Filled 2019-05-24 (×3): qty 2

## 2019-05-24 MED ORDER — ADULT MULTIVITAMIN W/MINERALS CH
1.0000 | ORAL_TABLET | Freq: Every day | ORAL | Status: DC
  Administered 2019-05-24 – 2019-05-26 (×3): 1 via ORAL
  Filled 2019-05-24 (×3): qty 1

## 2019-05-24 MED ORDER — LORATADINE 10 MG PO TABS
5.0000 mg | ORAL_TABLET | Freq: Every evening | ORAL | Status: DC
  Administered 2019-05-24 – 2019-05-25 (×2): 5 mg via ORAL
  Filled 2019-05-24: qty 0.5
  Filled 2019-05-24 (×2): qty 1

## 2019-05-24 MED ORDER — SODIUM CHLORIDE 0.9 % IV SOLN
2.0000 g | Freq: Once | INTRAVENOUS | Status: AC
  Administered 2019-05-24: 01:00:00 2 g via INTRAVENOUS
  Filled 2019-05-24: qty 2

## 2019-05-24 MED ORDER — METRONIDAZOLE IN NACL 5-0.79 MG/ML-% IV SOLN
500.0000 mg | Freq: Once | INTRAVENOUS | Status: AC
  Administered 2019-05-24: 01:00:00 500 mg via INTRAVENOUS
  Filled 2019-05-24: qty 100

## 2019-05-24 MED ORDER — FUROSEMIDE 40 MG PO TABS
40.0000 mg | ORAL_TABLET | Freq: Every day | ORAL | Status: DC
  Administered 2019-05-25 – 2019-05-26 (×2): 40 mg via ORAL
  Filled 2019-05-24 (×2): qty 1

## 2019-05-24 MED ORDER — FENOFIBRATE 160 MG PO TABS: 160.0000 mg | ORAL_TABLET | Freq: Every day | ORAL | Status: DC

## 2019-05-24 MED ORDER — MAGNESIUM HYDROXIDE 400 MG/5ML PO SUSP: 30.0000 mL | Freq: Every day | ORAL | Status: DC | PRN

## 2019-05-24 MED ORDER — VANCOMYCIN VARIABLE DOSE PER UNSTABLE RENAL FUNCTION (PHARMACIST DOSING): Status: DC

## 2019-05-24 MED ORDER — NITROGLYCERIN 0.4 MG SL SUBL: 0.4000 mg | SUBLINGUAL_TABLET | SUBLINGUAL | Status: DC | PRN

## 2019-05-24 MED ORDER — SODIUM CHLORIDE 0.9 % IV SOLN: INTRAVENOUS | Status: DC

## 2019-05-24 MED ORDER — ALBUTEROL SULFATE (2.5 MG/3ML) 0.083% IN NEBU: 2.5000 mg | INHALATION_SOLUTION | Freq: Four times a day (QID) | RESPIRATORY_TRACT | Status: DC | PRN

## 2019-05-24 MED ORDER — ACETAMINOPHEN 650 MG RE SUPP: 650.0000 mg | Freq: Four times a day (QID) | RECTAL | Status: DC | PRN

## 2019-05-24 MED ORDER — VANCOMYCIN HCL IN DEXTROSE 1-5 GM/200ML-% IV SOLN
1000.0000 mg | Freq: Once | INTRAVENOUS | Status: AC
  Administered 2019-05-24: 1000 mg via INTRAVENOUS
  Filled 2019-05-24: qty 200

## 2019-05-24 MED ORDER — ONDANSETRON HCL 4 MG/2ML IJ SOLN: 4.0000 mg | Freq: Four times a day (QID) | INTRAMUSCULAR | Status: DC | PRN

## 2019-05-24 MED ORDER — CLOPIDOGREL BISULFATE 75 MG PO TABS
75.0000 mg | ORAL_TABLET | Freq: Every day | ORAL | Status: DC
  Administered 2019-05-24 – 2019-05-26 (×3): 75 mg via ORAL
  Filled 2019-05-24 (×3): qty 1

## 2019-05-24 MED ORDER — GENTAMICIN SULFATE 0.1 % EX CREA
1.0000 "application " | TOPICAL_CREAM | Freq: Every day | CUTANEOUS | Status: DC
  Administered 2019-05-25 – 2019-05-26 (×2): 1 via TOPICAL
  Filled 2019-05-24 (×2): qty 15

## 2019-05-24 MED ORDER — ROSUVASTATIN CALCIUM 10 MG PO TABS
40.0000 mg | ORAL_TABLET | Freq: Every day | ORAL | Status: DC
  Administered 2019-05-24 – 2019-05-25 (×2): 40 mg via ORAL
  Filled 2019-05-24: qty 4
  Filled 2019-05-24: qty 2

## 2019-05-24 MED ORDER — ACETAMINOPHEN 325 MG PO TABS
650.0000 mg | ORAL_TABLET | Freq: Four times a day (QID) | ORAL | Status: DC | PRN
  Filled 2019-05-24: qty 2

## 2019-05-24 MED ORDER — FUROSEMIDE 80 MG PO TABS
80.0000 mg | ORAL_TABLET | Freq: Every day | ORAL | Status: DC
  Administered 2019-05-24: 80 mg via ORAL
  Filled 2019-05-24: qty 2

## 2019-05-24 MED ORDER — CARVEDILOL 3.125 MG PO TABS
3.1250 mg | ORAL_TABLET | Freq: Two times a day (BID) | ORAL | Status: DC
  Administered 2019-05-24 – 2019-05-26 (×4): 3.125 mg via ORAL
  Filled 2019-05-24 (×6): qty 1

## 2019-05-24 MED ORDER — ATORVASTATIN CALCIUM 20 MG PO TABS
40.0000 mg | ORAL_TABLET | Freq: Every day | ORAL | Status: DC
  Filled 2019-05-24: qty 2

## 2019-05-24 MED ORDER — FERROUS SULFATE 325 (65 FE) MG PO TABS
650.0000 mg | ORAL_TABLET | Freq: Every day | ORAL | Status: DC
  Administered 2019-05-24 – 2019-05-26 (×3): 650 mg via ORAL
  Filled 2019-05-24 (×4): qty 2

## 2019-05-24 MED ORDER — VANCOMYCIN HCL IN DEXTROSE 1-5 GM/200ML-% IV SOLN: 1000.0000 mg | Freq: Once | INTRAVENOUS | Status: DC

## 2019-05-24 MED ORDER — DELFLEX-LC/1.5% DEXTROSE 344 MOSM/L IP SOLN
INTRAPERITONEAL | Status: DC
  Administered 2019-05-25: 14:00:00 6000 mL via INTRAPERITONEAL
  Filled 2019-05-24 (×3): qty 3000

## 2019-05-24 MED ORDER — ACETAMINOPHEN 325 MG PO TABS
650.0000 mg | ORAL_TABLET | Freq: Once | ORAL | Status: AC
  Administered 2019-05-24: 01:00:00 650 mg via ORAL
  Filled 2019-05-24: qty 2

## 2019-05-24 MED ORDER — ONDANSETRON HCL 4 MG PO TABS: 4.0000 mg | ORAL_TABLET | Freq: Four times a day (QID) | ORAL | Status: DC | PRN

## 2019-05-24 MED ORDER — VANCOMYCIN HCL IN DEXTROSE 1-5 GM/200ML-% IV SOLN
1000.0000 mg | Freq: Once | INTRAVENOUS | Status: AC
  Administered 2019-05-24: 02:00:00 1000 mg via INTRAVENOUS
  Filled 2019-05-24: qty 200

## 2019-05-24 MED ORDER — FLUTICASONE PROPIONATE 50 MCG/ACT NA SUSP
2.0000 | Freq: Every day | NASAL | Status: DC
  Administered 2019-05-25: 22:00:00 2 via NASAL
  Filled 2019-05-24 (×3): qty 16

## 2019-05-24 MED ORDER — METRONIDAZOLE IN NACL 5-0.79 MG/ML-% IV SOLN
500.0000 mg | Freq: Three times a day (TID) | INTRAVENOUS | Status: DC
  Administered 2019-05-24 – 2019-05-26 (×7): 500 mg via INTRAVENOUS
  Filled 2019-05-24 (×11): qty 100

## 2019-05-24 MED ORDER — HEPARIN 1000 UNIT/ML FOR PERITONEAL DIALYSIS
500.0000 [IU] | INTRAMUSCULAR | Status: DC | PRN
  Filled 2019-05-24 (×2): qty 0.5

## 2019-05-24 MED ORDER — TRAZODONE HCL 50 MG PO TABS
25.0000 mg | ORAL_TABLET | Freq: Every evening | ORAL | Status: DC | PRN
  Filled 2019-05-24: qty 0.5

## 2019-05-24 MED ORDER — HEPARIN SODIUM (PORCINE) 5000 UNIT/ML IJ SOLN
5000.0000 [IU] | Freq: Three times a day (TID) | INTRAMUSCULAR | Status: DC
  Administered 2019-05-24 – 2019-05-26 (×6): 5000 [IU] via SUBCUTANEOUS
  Filled 2019-05-24 (×8): qty 1

## 2019-05-24 MED ORDER — SODIUM CHLORIDE 0.9 % IV SOLN
1.0000 g | INTRAVENOUS | Status: DC
  Administered 2019-05-25 (×2): 1 g via INTRAVENOUS
  Filled 2019-05-24 (×5): qty 1

## 2019-05-24 MED ORDER — AMIODARONE HCL 200 MG PO TABS: 200.0000 mg | ORAL_TABLET | Freq: Every day | ORAL | Status: DC

## 2019-05-24 MED ORDER — SODIUM CHLORIDE 0.9 % IV SOLN: 2.0000 g | Freq: Two times a day (BID) | INTRAVENOUS | Status: DC

## 2019-05-24 NOTE — Progress Notes (Signed)
PHARMACY -  BRIEF ANTIBIOTIC NOTE   Pharmacy has received consult(s) for vanc/cefepime/flagyl from an ED provider.  The patient's profile has been reviewed for ht/wt/allergies/indication/available labs.    One time order(s) placed for vanc 2g IV load, cefepime 2g, flagyl 500 mg IV x 1  Further antibiotics/pharmacy consults should be ordered by admitting physician if indicated.                       Thank you,  Tobie Lords, PharmD, BCPS Clinical Pharmacist 05/24/2019  5:58 AM

## 2019-05-24 NOTE — Consult Note (Signed)
Pharmacy Antibiotic Note  Matthew [name] is a 85 y.o. male admitted on 05/24/2019 with sepsis.  Pharmacy has been consulted for Cefepime/Vancomycin dosing. Patient has ESRD (PD).     Vancomycin 2g given in the ED this morning.   Plan: 1. Cefepime 1g Q24h.   2. No additional loading dose needed. Will order a 24-hr Vancomycin Random level and check Scr.   Height: 6' (182.9 cm) Weight: 210 lb (95.3 kg) IBW/kg (Calculated) : 77.6  Temp (24hrs), Avg:101.3 F (38.5 C), Min:99.8 F (37.7 C), Max:102.8 F (39.3 C)  Recent Labs  Lab 05/24/19 0021 05/24/19 0319  WBC 13.3*  --   CREATININE 2.88*  --   LATICACIDVEN 2.7* 1.8    Estimated Creatinine Clearance: 22.1 mL/min (A) (by C-G formula based on SCr of 2.88 mg/dL (H)).    Allergies  Allergen Reactions  . Amoxicillin Diarrhea    Antimicrobials this admission: 12/31 Cefepime >>  12/31 Vancomcyin >>   Dose adjustments this admission:   Microbiology results:  BCx:  UCx:  Sputum:  MRSA PCR:   Thank you for allowing pharmacy to be a part of this patient's care.  Rowland Lathe 05/24/2019 9:32 AM

## 2019-05-24 NOTE — ED Provider Notes (Signed)
[facility] Emergency Department Provider Note  ____________________________________________   First MD Initiated Contact with Patient 05/24/19 [phone]     (approximate)  I have reviewed the triage vital signs and the nursing notes.   HISTORY  Chief Complaint Fever and Nausea    HPI Matthew [name] is a 85 y.o. male with below list of previous medical conditions including CAD CHF diabetes previous MI and hypertension currently receiving peritoneal dialysis presents to the emergency department secondary to fever and chills.  Patient's daughter noted fever at 10:15 PM tonight.  Patient admits to chills earlier today.  He does admit to cough and congestion.  Patient denies any known sick contact.  Patient denies any abdominal discomfort.       Past Medical History:  Diagnosis Date   Anemia    Arthritis    Broken arm 05/2013   Left   CHF (congestive heart failure) (HCC)    Chronic kidney disease    Coronary artery disease    Diabetes mellitus without complication ([place])    Dyspnea    DOE   Edema    FEET/LEGS OCCAS   Hypertension    Myocardial infarction [facility])     Patient Active Problem List   Diagnosis Date Noted   Sepsis ([place]) 02/15/2018   Fluid overload 01/10/2018   Hypoglycemia 12/09/2017   CAD (coronary artery disease) of bypass graft 12/04/2013   Unstable angina; Class III Angina 12/03/2013   Diabetes mellitus type 2, uncomplicated ([place]) 36/64/4034   HLD (hyperlipidemia) 12/03/2013   HTN (hypertension) 12/03/2013    Past Surgical History:  Procedure Laterality Date   CATARACT EXTRACTION W/PHACO Left 07/26/2017   Procedure: CATARACT EXTRACTION PHACO AND INTRAOCULAR LENS PLACEMENT ([place]  Surgeon: Birder Robson, MD;  Location: [facility] ORS;  Service: Ophthalmology;  Laterality: Left;  [place]   00:45.8 AP%  13.1 CDE  6.00 Fluid Pack Lot # [id number]   CATARACT EXTRACTION W/PHACO Right 08/17/2017   Procedure: CATARACT  EXTRACTION PHACO AND INTRAOCULAR LENS PLACEMENT (I[facility]);  Surgeon: Birder Robson, MD;  Location: [facility] ORS;  Service: Ophthalmology;  Laterality: Right;  [place]  01:30 AP% 16.8 CDE 15.24 Fluid pack lot # [id number]   CHOLECYSTECTOMY     CORONARY ANGIOPLASTY     STENTS   CORONARY ARTERY BYPASS GRAFT     DIALYSIS/PERMA CATHETER INSERTION N/A 02/09/2018   Procedure: DIALYSIS/PERMA CATHETER INSERTION;  Surgeon: Algernon Huxley, MD;  Location: [place] CV LAB;  Service: Cardiovascular;  Laterality: N/A;   FRACTURE SURGERY     ARM   LEFT HEART CATHETERIZATION WITH CORONARY/GRAFT ANGIOGRAM Left 12/04/2013   Procedure: LEFT HEART CATHETERIZATION WITH Beatrix Fetters;  Surgeon: Leonie Man, MD;  Location: [facility] CATH LAB;  Service: Cardiovascular;  Laterality: Left;   PERCUTANEOUS CORONARY STENT INTERVENTION (PCI-S) N/A 12/06/2013   Procedure: PERCUTANEOUS CORONARY STENT INTERVENTION (PCI-S);  Surgeon: Sinclair Grooms, MD;  Location: [facility] CATH LAB;  Service: Cardiovascular;  Laterality: N/A;   VEIN BYPASS SURGERY     [place] Bypass Surgery     Prior to Admission medications   Medication Sig Start Date End Date Taking? Authorizing Provider  amiodarone (PACERONE) 200 MG tablet Take 200 mg by mouth daily.    [provider]  atorvastatin (LIPITOR) 40 MG tablet Take 40 mg by mouth at bedtime.    [provider]  carvedilol (COREG) 3.125 MG tablet Take 1 tablet (3.125 mg total) by mouth 2 (two) times daily with a meal. 01/20/18  Hillary Bow, MD  Cholecalciferol (VITAMIN D3) 2000 UNITS TABS Take 2,000 Units by mouth daily with breakfast.    [provider]  clopidogrel (PLAVIX) 75 MG tablet Take 1 tablet (75 mg total) by mouth daily with breakfast. 12/07/13   Almyra Deforest, PA  fenofibrate 160 MG tablet Take 160 mg by mouth daily.    [provider]  ferrous sulfate 325 (65 FE) MG tablet Take 650 mg by mouth daily with breakfast.    [provider]  fluticasone (FLONASE) 50 MCG/ACT nasal spray Place 2 sprays into both nostrils at bedtime.    [provider]  furosemide (LASIX) 40 MG tablet Take 2 tablets (80 mg total) by mouth 2 (two) times daily. Patient taking differently: Take 80 mg by mouth daily.  01/15/18   Saundra Shelling, MD  levocetirizine (XYZAL) 5 MG tablet Take 5 mg by mouth every evening.    [provider]  Multiple Vitamins-Minerals (CENTRUM SILVER ADULT 50+) TABS Take 1 tablet by mouth daily with breakfast.    [provider]  nitroGLYCERIN (NITROSTAT) 0.4 MG SL tablet Place 0.4 mg under the tongue every 5 (five) minutes as needed for chest pain.    [provider]  Omega-3 Fatty Acids (FISH OIL) 1000 MG CAPS Take 4,000 mg by mouth daily at 12 noon.     [provider]  potassium chloride SA (K-DUR,KLOR-CON) 20 MEQ tablet Take 2 tablets (40 mEq total) by mouth daily. 01/16/18 02/15/18  Saundra Shelling, MD  PROAIR HFA 108 (90 Base) MCG/ACT inhaler Inhale 2 puffs into the lungs every 6 (six) hours as needed. 09/20/17   [provider]    Allergies Amoxicillin  Family History  Problem Relation Age of Onset   Heart attack Father 38       died first MI at age 24   Heart failure Mother     Social History Social History   Tobacco Use   Smoking status: Former Smoker    Types: Cigarettes   Smokeless tobacco: Never Used   Tobacco comment: quit 30 yrs ago, smoked 14-15 yrs, mainly pipe  Substance Use Topics   Alcohol use: Yes    Alcohol/week: 1.0 standard drinks    Types: 1 Glasses of wine per week    Comment: occasionally drinking only   Drug use: No    Review of Systems Constitutional: Positive for fever/chills Eyes: No visual changes. ENT: No sore throat.  Positive for nasal congestion Cardiovascular: Denies chest pain. Respiratory: Denies shortness of breath.  Positive for cough Gastrointestinal: No abdominal pain.  No nausea, no vomiting.  No  diarrhea.  No constipation. Genitourinary: Negative for dysuria. Musculoskeletal: Negative for neck pain.  Negative for back pain. Integumentary: Negative for rash. Neurological: Negative for headaches, focal weakness or numbness.   ____________________________________________   PHYSICAL EXAM:  VITAL SIGNS: ED Triage Vitals  Enc Vitals Group     BP 05/24/19 0019 (!) 153/82     Pulse Rate 05/24/19 0019 (!) 109     Resp --      Temp 05/24/19 0019 (!) 102.8 F (39.3 C)     Temp Source 05/24/19 0019 Oral     SpO2 05/24/19 0019 95 %     Weight 05/24/19 0020 95.3 kg (210 lb)     Height 05/24/19 0020 1.829 m (6')     Head Circumference --      Peak Flow --      Pain Score 05/24/19 0020 0  Pain Loc --      Pain Edu? --      Excl. in [place]? --     Constitutional: Alert and oriented.  Eyes: Conjunctivae are normal.  Mouth/Throat: Patient is wearing a mask. Neck: No stridor.  No meningeal signs.   Cardiovascular: Tachycardia, regular rhythm. Good peripheral circulation. Grossly normal heart sounds. Respiratory: Normal respiratory effort.  No retractions. Gastrointestinal: Soft and nontender. No distention.  Musculoskeletal: No lower extremity tenderness nor edema. No gross deformities of extremities. Neurologic:  Normal speech and language. No gross focal neurologic deficits are appreciated.  Skin:  Skin is warm, dry and intact. Psychiatric: Mood and affect are normal. Speech and behavior are normal.  ____________________________________________   LABS (all labs ordered are listed, but only abnormal results are displayed)  Labs Reviewed  LACTIC ACID, PLASMA - Abnormal; Notable for the following components:      Result Value   Lactic Acid, Venous 2.7 (*)    All other components within normal limits  COMPREHENSIVE METABOLIC PANEL - Abnormal; Notable for the following components:   Glucose, Bld 178 (*)    BUN 33 (*)    Creatinine, Ser 2.88 (*)    Total Bilirubin 1.3 (*)      GFR calc non Af Amer 19 (*)    GFR calc Af Amer 22 (*)    All other components within normal limits  CBC WITH DIFFERENTIAL/PLATELET - Abnormal; Notable for the following components:   WBC 13.3 (*)    Platelets 132 (*)    Neutro Abs 11.8 (*)    Lymphs Abs 0.4 (*)    All other components within normal limits  URINALYSIS, ROUTINE W REFLEX MICROSCOPIC - Abnormal; Notable for the following components:   Color, Urine YELLOW (*)    APPearance CLEAR (*)    Protein, ur 30 (*)    All other components within normal limits  CULTURE, BLOOD (ROUTINE X 2)  CULTURE, BLOOD (ROUTINE X 2)  URINE CULTURE  SARS CORONAVIRUS 2 (TAT 6-24 HRS)  LACTIC ACID, PLASMA  APTT  PROTIME-INR  PROTIME-INR  CORTISOL-AM, BLOOD  PROCALCITONIN  BASIC METABOLIC PANEL  CBC  POC SARS CORONAVIRUS 2 AG -  ED  POC SARS CORONAVIRUS 2 AG  POC SARS CORONAVIRUS 2 AG   ____________________________________________  EKG  ED ECG REPORT I, [place] Jalea Bronaugh, the attending physician, personally viewed and interpreted this ECG.   Date: 05/24/2019  EKG Time: 12:24 AM  Rate: 108  Rhythm: Sinus tachycardia  Axis: Normal  Intervals: Normal  ST&T Change: None  ____________________________________________  RADIOLOGY I, [place] Ernst Bowler, personally viewed and evaluated these images (plain radiographs) as part of my medical decision making, as well as reviewing the written report by the radiologist.  ED MD interpretation: Normal chest x-ray per radiologist.  Official radiology report(s): DG Chest Port 1 View  Result Date: 05/24/2019 CLINICAL DATA:  Fever. EXAM: PORTABLE CHEST 1 VIEW COMPARISON:  Radiograph 02/15/2018 FINDINGS: Post median sternotomy and CABG. Stable cardiomegaly. Previous left dialysis catheter is been removed. No focal airspace disease, pulmonary edema, large pleural effusion or pneumothorax. Degenerative change in both shoulders. No acute osseous abnormalities are seen. IMPRESSION: 1. Stable  cardiomegaly without congestive failure. 2. No evidence of pneumonia or focal airspace disease. Electronically Signed   By: Keith Rake M.D.   On: 05/24/2019 01:42    ____________________________________________   PROCEDURES     .Critical Care Performed by: Gregor Hams, MD Authorized by: Gregor Hams, MD  Critical care provider statement:    Critical care time (minutes):  30   Critical care time was exclusive of:  Separately billable procedures and treating other patients   Critical care was necessary to treat or prevent imminent or life-threatening deterioration of the following conditions:  Sepsis   Critical care was time spent personally by me on the following activities:  Development of treatment plan with patient or surrogate, discussions with consultants, evaluation of patient's response to treatment, examination of patient, obtaining history from patient or surrogate, ordering and performing treatments and interventions, ordering and review of laboratory studies, ordering and review of radiographic studies, pulse oximetry, re-evaluation of patient's condition and review of old charts     ____________________________________________   [place] / MDM / [place] / ED COURSE  As part of my medical decision making, I reviewed the following data within the electronic MEDICAL RECORD NUMBER   85 year old male presented with above-stated history and physical exam meeting sepsis criteria and as such protocol was initiated.  Patient received appropriate IV antibiotic therapy.  Laboratory data notable for white blood cell count of 13 lactic acid of 2.7.  Urinalysis revealed no evidence of UTI chest x-ray revealed no evidence of pneumonia.  Given absence of abdominal pain SBP unlikely.  Patient discussed with Dr. Danella Penton for hospital admission for further evaluation and management.  ____________________________________________  FINAL CLINICAL IMPRESSION(S)  / ED DIAGNOSES  Final diagnoses:  Sepsis, due to unspecified organism, unspecified whether acute organ dysfunction present [facility])     MEDICATIONS GIVEN DURING THIS VISIT:  Medications  amiodarone (PACERONE) tablet 200 mg (has no administration in time range)  atorvastatin (LIPITOR) tablet 40 mg (has no administration in time range)  carvedilol (COREG) tablet 3.125 mg (has no administration in time range)  fenofibrate tablet 160 mg (has no administration in time range)  furosemide (LASIX) tablet 80 mg (has no administration in time range)  nitroGLYCERIN (NITROSTAT) SL tablet 0.4 mg (has no administration in time range)  clopidogrel (PLAVIX) tablet 75 mg (has no administration in time range)  ferrous sulfate tablet 650 mg (has no administration in time range)  Vitamin D3 TABS 2,000 Units (has no administration in time range)  Centrum Silver Adult 50+ TABS 1 tablet (has no administration in time range)  fluticasone (FLONASE) 50 MCG/ACT nasal spray 2 spray (has no administration in time range)  levocetirizine (XYZAL) tablet 5 mg (has no administration in time range)  albuterol (VENTOLIN HFA) 108 (90 Base) MCG/ACT inhaler 2 puff (has no administration in time range)  heparin injection 5,000 Units (has no administration in time range)  0.9 %  sodium chloride infusion (has no administration in time range)  ceFEPIme (MAXIPIME) 2 g in sodium chloride 0.9 % 100 mL IVPB (has no administration in time range)  metroNIDAZOLE (FLAGYL) IVPB 500 mg (has no administration in time range)  vancomycin (VANCOCIN) IVPB 1000 mg/200 mL premix (has no administration in time range)  acetaminophen (TYLENOL) tablet 650 mg (has no administration in time range)    Or  acetaminophen (TYLENOL) suppository 650 mg (has no administration in time range)  traZODone (DESYREL) tablet 25 mg (has no administration in time range)  ondansetron (ZOFRAN) tablet 4 mg (has no administration in time range)    Or  ondansetron  (ZOFRAN) injection 4 mg (has no administration in time range)  magnesium hydroxide (MILK OF MAGNESIA) suspension 30 mL (has no administration in time range)  ceFEPIme (MAXIPIME) 2 g in sodium chloride 0.9 %  100 mL IVPB (0 g Intravenous Stopped 05/24/19 0148)  metroNIDAZOLE (FLAGYL) IVPB 500 mg (0 mg Intravenous Stopped 05/24/19 0224)  vancomycin (VANCOCIN) IVPB 1000 mg/200 mL premix (0 mg Intravenous Stopped 05/24/19 0245)  acetaminophen (TYLENOL) tablet 650 mg (650 mg Oral Given 05/24/19 0107)     ED Discharge Orders    None      *Please note:  JHONATHAN DESROCHES was evaluated in Emergency Department on 05/24/2019 for the symptoms described in the history of present illness. He was evaluated in the context of the global COVID-19 pandemic, which necessitated consideration that the patient might be at risk for infection with the SARS-CoV-2 virus that causes COVID-19. Institutional protocols and algorithms that pertain to the evaluation of patients at risk for COVID-19 are in a state of rapid change based on information released by regulatory bodies including the CDC and federal and state organizations. These policies and algorithms were followed during the patient's care in the ED.  Some ED evaluations and interventions may be delayed as a result of limited staffing during the pandemic.*  Note:  This document was prepared using Dragon voice recognition software and may include unintentional dictation errors.   Gregor Hams, MD 05/24/19 [phone]

## 2019-05-24 NOTE — ED Notes (Signed)
Sharion Settler NP messaged regarding blood pressure trending down this morning, currently 93/53.

## 2019-05-24 NOTE — ED Notes (Signed)
Breakfast tray given. °

## 2019-05-24 NOTE — Sepsis Progress Note (Signed)
Code Sepsis monitoring discontinued due to cancellation per Dr Sidney Ace.

## 2019-05-24 NOTE — Progress Notes (Signed)
HPI on Admission:  Matthew [name]  is a 85 y.o. Caucasian male with a known history of end-stage renal disease on peritoneal dialysis, type diabetes mellitus, coronary artery disease and CHF, who presented to the emergency room with acute onset of fever and chills.  He denies any cough or wheezing or dyspnea.  He admits to rhinorrhea without sore throat or earache.  He denied any loss of taste or smell or recent exposure to COVID-19.  He takes MiraLAX for constipation and had slightly loose bowel movement today.  He feels generally weak when he gets up but denied any body aches.  No chest pain or palpitations.  No dysuria, oliguria or hematuria or flank pain. Upon presentation to the emergency room, temperature was 102.8 with a blood pressure of 153/82 and pulse of 109 with pulse currently of 95% on room air and respiratory rate of 27.  Labs are remarkable for a BUN of 33 and creatinine of 2.88 and lactic acid 2.7 with procalcitonin less than 0.1.  CBC showed leukocytosis of 13.3 with neutrophilia.  Patient had 2 blood cultures.  His COVID-19 antigen test came back negative.  COVID-19 PCR is currently pending.  Urinalysis was unremarkable.  Portable chest ray showed stable cardiomegaly without CHF or evidence for pneumonia. The patient was empirically given 650 mg p.o. Tylenol with IV cefepime, vancomycin and Flagyl.  He will be admitted to medical monitored bed for further evaluation and management.  A/P: Chart reviewed and patient seen at the bedside.  Currently doing better. -Continue current plan and treatment with empiric antibiotic while cultures are pending -Covid influenza a and B tested negative -Resume home medication    1.  Sepsis, rule out bacteremia given unclear source/etiology.  This is manifested by fever, tachycardia significant leukocytosis and lactic acid of 2.7.  I do not believe the patient has severe sepsis or septic shock.   The patient will be admitted to a medical monitored bed.   We will continue gentle hydration with IV normal saline.  Will follow blood cultures and continue broad-spectrum IV antibiotics at this time.   2.  End-stage renal disease on peritoneal dialysis.  Nephrology consultation will be obtained for follow-up.  I notified Dr. Candiss Norse regarding the patient.  3.  Type 2 diabetes mellitus.  The patient will be placed on supplemental coverage with Humalog.  4.  Coronary artery disease.  He will be continued on Plavix was statin therapy and beta-blocker therapy with as needed sublingual nitroglycerin.  5.  Dyslipidemia.  Statin therapy will be resumed.  6.  Hypertension.  We will continue Coreg.  7.  Paroxysmal atrial fibrillation.  We will continue amiodarone   All the records are reviewed and case discussed with ED provider. The plan of care was discussed in details with the patient (and family). I answered all questions. The patient agreed to proceed with the above mentioned plan. Further management will depend upon hospital course.   CODE STATUS: Full code

## 2019-05-24 NOTE — ED Notes (Signed)
Message sent to pharmacy for ferrous sulfate

## 2019-05-24 NOTE — ED Notes (Signed)
Report given to Brandi, RN.

## 2019-05-24 NOTE — ED Notes (Signed)
Patient has been resting quietly most of the night, and the only need expressed  through the night has been for a drink of water.  His blood pressure trended down this morning but it is currently up to 115/59.

## 2019-05-24 NOTE — H&P (Signed)
[facility] NAME: Matthew [name]    MR#:  [id number]  DATE OF BIRTH:  1931-05-24  DATE OF ADMISSION:  05/24/2019  PRIMARY CARE PHYSICIAN: Matthew Marble, MD   REQUESTING/REFERRING PHYSICIAN: Marjean Donna, MD CHIEF COMPLAINT:   Chief Complaint  Patient presents with  . Fever  . Nausea    HISTORY OF PRESENT ILLNESS:  Matthew [name]  is a 85 y.o. Caucasian male with a known history of end-stage renal disease on peritoneal dialysis, type diabetes mellitus, coronary artery disease and CHF, who presented to the emergency room with acute onset of fever and chills.  He denies any cough or wheezing or dyspnea.  He admits to rhinorrhea without sore throat or earache.  He denied any loss of taste or smell or recent exposure to COVID-19.  He takes MiraLAX for constipation and had slightly loose bowel movement today.  He feels generally weak when he gets up but denied any body aches.  No chest pain or palpitations.  No dysuria, oliguria or hematuria or flank pain.  Upon presentation to the emergency room, temperature was 102.8 with a blood pressure of 153/82 and pulse of 109 with pulse currently of 95% on room air and respiratory rate of 27.  Labs are remarkable for a BUN of 33 and creatinine of 2.88 and lactic acid 2.7 with procalcitonin less than 0.1.  CBC showed leukocytosis of 13.3 with neutrophilia.  Patient had 2 blood cultures.  His COVID-19 antigen test came back negative.  COVID-19 PCR is currently pending.  Urinalysis was unremarkable.  Portable chest ray showed stable cardiomegaly without CHF or evidence for pneumonia.  The patient was empirically given 650 mg p.o. Tylenol with IV cefepime, vancomycin and Flagyl.  He will be admitted to medical monitored bed for further evaluation and management.   PAST MEDICAL HISTORY:   Past Medical History:  Diagnosis Date  . Anemia   . Arthritis   . Broken arm 05/2013   Left  . CHF (congestive heart  failure) ([place])   . Chronic kidney disease   . Coronary artery disease   . Diabetes mellitus without complication ([place])   . Dyspnea    DOE  . Edema    FEET/LEGS OCCAS  . Hypertension   . Myocardial infarction [facility])   End-stage renal disease on peritoneal dialysis  PAST SURGICAL HISTORY:   Past Surgical History:  Procedure Laterality Date  . CATARACT EXTRACTION W/PHACO Left 07/26/2017   Procedure: CATARACT EXTRACTION PHACO AND INTRAOCULAR LENS PLACEMENT (IOC);  Surgeon: Matthew Robson, MD;  Location: [facility] ORS;  Service: Ophthalmology;  Laterality: Left;  [place]   00:45.8 AP%  13.1 CDE  6.00 Fluid Pack Lot # [id number]  . CATARACT EXTRACTION W/PHACO Right 08/17/2017   Procedure: CATARACT EXTRACTION PHACO AND INTRAOCULAR LENS PLACEMENT (IOC);  Surgeon: Matthew Robson, MD;  Location: [facility] ORS;  Service: Ophthalmology;  Laterality: Right;  [place]  01:30 AP% 16.8 CDE 15.24 Fluid pack lot # [id number]  . CHOLECYSTECTOMY    . CORONARY ANGIOPLASTY     STENTS  . CORONARY ARTERY BYPASS GRAFT    . DIALYSIS/PERMA CATHETER INSERTION N/A 02/09/2018   Procedure: DIALYSIS/PERMA CATHETER INSERTION;  Surgeon: Matthew Huxley, MD;  Location: [place] CV LAB;  Service: Cardiovascular;  Laterality: N/A;  . FRACTURE SURGERY     ARM  . LEFT HEART CATHETERIZATION WITH CORONARY/GRAFT ANGIOGRAM Left 12/04/2013   Procedure: LEFT HEART CATHETERIZATION WITH Beatrix Fetters;  Surgeon: Leonie Man,  MD;  Location: [place] CATH LAB;  Service: Cardiovascular;  Laterality: Left;  . PERCUTANEOUS CORONARY STENT INTERVENTION (PCI-S) N/A 12/06/2013   Procedure: PERCUTANEOUS CORONARY STENT INTERVENTION (PCI-S);  Surgeon: Matthew Grooms, MD;  Location: [name] CATH LAB;  Service: Cardiovascular;  Laterality: N/A;  . VEIN BYPASS SURGERY     [place] Bypass Surgery     SOCIAL HISTORY:   Social History   Tobacco Use  . Smoking status: Former Smoker    Types: Cigarettes  . Smokeless tobacco: Never Used  . Tobacco  comment: quit 30 yrs ago, smoked 14-15 yrs, mainly pipe  Substance Use Topics  . Alcohol use: Yes    Alcohol/week: 1.0 standard drinks    Types: 1 Glasses of wine per week    Comment: occasionally drinking only    FAMILY HISTORY:   Family History  Problem Relation Age of Onset  . Heart attack Father 33       died first MI at age 104  . Heart failure Mother     DRUG ALLERGIES:   Allergies  Allergen Reactions  . Amoxicillin Diarrhea    REVIEW OF SYSTEMS:   ROS As per history of present illness. All pertinent systems were reviewed above. Constitutional,  HEENT, cardiovascular, respiratory, GI, GU, musculoskeletal, neuro, psychiatric, endocrine,  integumentary and hematologic systems were reviewed and are otherwise  negative/unremarkable except for positive findings mentioned above in the HPI.   MEDICATIONS AT HOME:   Prior to Admission medications   Medication Sig Start Date End Date Taking? Authorizing Provider  amiodarone (PACERONE) 200 MG tablet Take 200 mg by mouth daily.    [provider]  atorvastatin (LIPITOR) 40 MG tablet Take 40 mg by mouth at bedtime.    [provider]  carvedilol (COREG) 3.125 MG tablet Take 1 tablet (3.125 mg total) by mouth 2 (two) times daily with a meal. 01/20/18   Matthew Bow, MD  Cholecalciferol (VITAMIN D3) 2000 UNITS TABS Take 2,000 Units by mouth daily with breakfast.    [provider]  clopidogrel (PLAVIX) 75 MG tablet Take 1 tablet (75 mg total) by mouth daily with breakfast. 12/07/13   Matthew Deforest, PA  fenofibrate 160 MG tablet Take 160 mg by mouth daily.    [provider]  ferrous sulfate 325 (65 FE) MG tablet Take 650 mg by mouth daily with breakfast.    [provider]  fluticasone (FLONASE) 50 MCG/ACT nasal spray Place 2 sprays into both nostrils at bedtime.    [provider]  furosemide (LASIX) 40 MG tablet Take 2 tablets (80 mg total) by mouth 2 (two) times daily. Patient  taking differently: Take 80 mg by mouth daily.  01/15/18   Matthew Shelling, MD  levocetirizine (XYZAL) 5 MG tablet Take 5 mg by mouth every evening.    [provider]  Multiple Vitamins-Minerals (CENTRUM SILVER ADULT 50+) TABS Take 1 tablet by mouth daily with breakfast.    [provider]  nitroGLYCERIN (NITROSTAT) 0.4 MG SL tablet Place 0.4 mg under the tongue every 5 (five) minutes as needed for chest pain.    [provider]  Omega-3 Fatty Acids (FISH OIL) 1000 MG CAPS Take 4,000 mg by mouth daily at 12 noon.     [provider]  potassium chloride SA (K-DUR,KLOR-CON) 20 MEQ tablet Take 2 tablets (40 mEq total) by mouth daily. 01/16/18 02/15/18  Matthew Shelling, MD  PROAIR HFA 108 (90 Base) MCG/ACT inhaler Inhale 2 puffs into the lungs  every 6 (six) hours as needed. 09/20/17   [provider]      VITAL SIGNS:  Blood pressure 127/72, pulse (!) 104, temperature (!) 102.8 F (39.3 C), temperature source Oral, resp. rate 17, height 6' (1.829 m), weight 95.3 kg, SpO2 97 %.  PHYSICAL EXAMINATION:  Physical Exam  GENERAL:  85 y.o.-year-old patient lying in the bed with no acute distress.  EYES: Pupils equal, round, reactive to light and accommodation. No scleral icterus. Extraocular muscles intact.  HEENT: Head atraumatic, normocephalic. Oropharynx and nasopharynx clear.  NECK:  Supple, no jugular venous distention. No thyroid enlargement, no tenderness.  LUNGS: Normal breath sounds bilaterally, no wheezing, rales,rhonchi or crepitation. No use of accessory muscles of respiration.  CARDIOVASCULAR: Regular rate and rhythm, S1, S2 normal. No murmurs, rubs, or gallops.  ABDOMEN: Soft, nondistended, nontender. Bowel sounds present. No organomegaly or mass.  EXTREMITIES: No pedal edema, cyanosis, or clubbing.  NEUROLOGIC: Cranial nerves II through XII are intact. Muscle strength 5/5 in all extremities. Sensation intact. Gait not checked.  PSYCHIATRIC: The  patient is alert and oriented x 3.  Normal affect and good eye contact. SKIN: No obvious rash, lesion, or ulcer.   LABORATORY PANEL:   CBC Recent Labs  Lab 05/24/19 0021  WBC 13.3*  HGB 13.4  HCT 41.7  PLT 132*   ------------------------------------------------------------------------------------------------------------------  Chemistries  Recent Labs  Lab 05/24/19 0021  NA 140  K 4.3  CL 101  CO2 24  GLUCOSE 178*  BUN 33*  CREATININE 2.88*  CALCIUM 9.4  AST 40  ALT 37  ALKPHOS 112  BILITOT 1.3*   ------------------------------------------------------------------------------------------------------------------  Cardiac Enzymes No results for input(s): TROPONINI in the last 168 hours. ------------------------------------------------------------------------------------------------------------------  RADIOLOGY:  DG Chest Port 1 View  Result Date: 05/24/2019 CLINICAL DATA:  Fever. EXAM: PORTABLE CHEST 1 VIEW COMPARISON:  Radiograph 02/15/2018 FINDINGS: Post median sternotomy and CABG. Stable cardiomegaly. Previous left dialysis catheter is been removed. No focal airspace disease, pulmonary edema, large pleural effusion or pneumothorax. Degenerative change in both shoulders. No acute osseous abnormalities are seen. IMPRESSION: 1. Stable cardiomegaly without congestive failure. 2. No evidence of pneumonia or focal airspace disease. Electronically Signed   By: Keith Rake M.D.   On: 05/24/2019 01:42      IMPRESSION AND PLAN:   1.  Sepsis, rule out bacteremia given unclear source/etiology.  This is manifested by fever, tachycardia significant leukocytosis and lactic acid of 2.7.  I do not believe the patient has severe sepsis or septic shock.  COVID-19 would still be in the differential diagnosis especially given procalcitonin less than 0.1.  The patient will be admitted to a medical monitored bed.  We will continue gentle hydration with IV normal saline.  Will follow  blood cultures and continue broad-spectrum IV antibiotics at this time.  Will follow COVID-19 PCR.  We will also obtain influenza a and B antigens.  2.  End-stage renal disease on peritoneal dialysis.  Nephrology consultation will be obtained for follow-up.  I notified Dr. Candiss Norse regarding the patient.  3.  Type 2 diabetes mellitus.  The patient will be placed on supplemental coverage with Humalog.  4.  Coronary artery disease.  He will be continued on Plavix was statin therapy and beta-blocker therapy with as needed sublingual nitroglycerin.  5.  Dyslipidemia.  Statin therapy will be resumed.  6.  Hypertension.  We will continue Coreg.  7.  Paroxysmal atrial fibrillation.  We will continue amiodarone   All the records are reviewed  and case discussed with ED provider. The plan of care was discussed in details with the patient (and family). I answered all questions. The patient agreed to proceed with the above mentioned plan. Further management will depend upon hospital course.   CODE STATUS: Full code  TOTAL TIME TAKING CARE OF THIS PATIENT: 55 minutes.    Christel Mormon M.D on 05/24/2019 at 2:13 AM  [facility]   From 7 PM-7 AM, contact night-coverage [email or web address]  CC: Primary care physician; Matthew Marble, MD   Note: This dictation was prepared with Dragon dictation along with smaller phrase technology. Any transcriptional errors that result from this process are unintentional.

## 2019-05-24 NOTE — Progress Notes (Signed)
[place], [place] 05/24/19  Subjective:   Hospital day # 0 Patient known to our practice from outpatient dialysis.  Presented to the hospital for complaints of subjective fever, high blood pressure and shivering at about 10 PM last night.  Was noted to have a temperature of 102.8 upon arrival to the ER.  Covid test is negative. Neuro: No complaints cvs: No chest pain or shortness of breath pulm: No cough or sputum production gi: Appetite is good.  No nausea or vomiting reported at present.  No diarrhea. Renal: Denies any problems with voiding.  Usually does peritoneal dialysis without problem 12/30 0701 - 12/31 0700 In: 403.1 [IV Piggyback:403.1] Out: -  Chest x-ray showed stable cardiomegaly without congestive changes.  No pneumonia or focal airspace disease. Nephrology consult has been requested for evaluation and to arrange peritoneal dialysis  Objective:  Vital signs in last 24 hours:  Temp:  [99.8 F (37.7 C)-102.8 F (39.3 C)] 99.8 F (37.7 C) (12/31 0237) Pulse Rate:  [66-109] 66 (12/31 1000) Resp:  [12-27] 22 (12/31 1000) BP: (91-153)/(51-82) 111/60 (12/31 1000) SpO2:  [92 %-97 %] 95 % (12/31 1000) Weight:  [95.3 kg] 95.3 kg (12/31 0020)  Weight change:  Filed Weights   05/24/19 0020  Weight: 95.3 kg    Intake/Output:    Intake/Output Summary (Last 24 hours) at 05/24/2019 1122 Last data filed at 05/24/2019 1021 Gross per 24 hour  Intake 503.12 ml  Output 200 ml  Net 303.12 ml     Physical Exam: General:  No acute distress, laying in the bed  HEENT  anicteric, moist oral mucous membrane  Pulm/lungs  normal breathing effort, clear to auscultation  CVS/Heart  irregular rhythm, no rub or gallop  Abdomen:   Soft, nontender  Extremities:  No peripheral edema  Neurologic:  Alert, oriented  Skin:  No acute rashes  Access:  Peritoneal dialysis catheter in place       Basic Metabolic Panel:  Recent Labs  Lab 05/24/19 0021   NA 140  K 4.3  CL 101  CO2 24  GLUCOSE 178*  BUN 33*  CREATININE 2.88*  CALCIUM 9.4     CBC: Recent Labs  Lab 05/24/19 0021  WBC 13.3*  NEUTROABS 11.8*  HGB 13.4  HCT 41.7  MCV 94.3  PLT 132*     No results found for: HEPBSAG, HEPBSAB, HEPBIGM    Microbiology:  Recent Results (from the past 240 hour(s))  Blood Culture (routine x 2)     Status: None (Preliminary result)   Collection Time: 05/24/19 12:21 AM   Specimen: BLOOD  Result Value Ref Range Status   Specimen Description BLOOD BLOOD LEFT ARM  Final   Special Requests   Final    BOTTLES DRAWN AEROBIC AND ANAEROBIC Blood Culture adequate volume   Culture   Final    NO GROWTH < 12 HOURS Performed at [facility], [address] [place], [place] [address]    Report Status PENDING  Incomplete  Blood Culture (routine x 2)     Status: None (Preliminary result)   Collection Time: 05/24/19 12:21 AM   Specimen: BLOOD  Result Value Ref Range Status   Specimen Description BLOOD BLOOD LEFT HAND  Final   Special Requests   Final    BOTTLES DRAWN AEROBIC AND ANAEROBIC Blood Culture adequate volume   Culture   Final    NO GROWTH < 12 HOURS Performed at [facility], [address],  [place], [place] [address]    Report Status PENDING  Incomplete  SARS CORONAVIRUS 2 (TAT 6-24 HRS) Nasopharyngeal Nasopharyngeal Swab     Status: None   Collection Time: 05/24/19  1:51 AM   Specimen: Nasopharyngeal Swab  Result Value Ref Range Status   SARS Coronavirus 2 NEGATIVE NEGATIVE Final    Comment: (NOTE) SARS-CoV-2 target nucleic acids are NOT DETECTED. The SARS-CoV-2 RNA is generally detectable in upper and lower respiratory specimens during the acute phase of infection. Negative results do not preclude SARS-CoV-2 infection, do not rule out co-infections with other pathogens, and should not be used as the sole basis for treatment or other patient management decisions. Negative results must be combined  with clinical observations, patient history, and epidemiological information. The expected result is Negative. Fact Sheet for Patients: SugarRoll.be Fact Sheet for Healthcare Providers: https://www.woods-mathews.com/ This test is not yet approved or cleared by the [place] FDA and  has been authorized for detection and/or diagnosis of SARS-CoV-2 by FDA under an Emergency Use Authorization (EUA). This EUA will remain  in effect (meaning this test can be used) for the duration of the COVID-19 declaration under Section 56 4(b)(1) of the Act, 21 U.S.C. section 360bbb-3(b)(1), unless the authorization is terminated or revoked sooner. Performed at [facility], [place] [address], [place], [place] [address]     Coagulation Studies: Recent Labs    05/24/19 0021  LABPROT 12.8  INR 1.0    Urinalysis: Recent Labs    05/24/19 0021  COLORURINE YELLOW*  LABSPEC 1.012  PHURINE 6.0  GLUCOSEU NEGATIVE  HGBUR NEGATIVE  BILIRUBINUR NEGATIVE  KETONESUR NEGATIVE  PROTEINUR 30*  NITRITE NEGATIVE  LEUKOCYTESUR NEGATIVE      Imaging: DG Chest Port 1 View  Result Date: 05/24/2019 CLINICAL DATA:  Fever. EXAM: PORTABLE CHEST 1 VIEW COMPARISON:  Radiograph 02/15/2018 FINDINGS: Post median sternotomy and CABG. Stable cardiomegaly. Previous left dialysis catheter is been removed. No focal airspace disease, pulmonary edema, large pleural effusion or pneumothorax. Degenerative change in both shoulders. No acute osseous abnormalities are seen. IMPRESSION: 1. Stable cardiomegaly without congestive failure. 2. No evidence of pneumonia or focal airspace disease. Electronically Signed   By: Keith Rake M.D.   On: 05/24/2019 01:42     Medications:   . sodium chloride 75 mL/hr at 05/24/19 0551  . ceFEPime (MAXIPIME) IV    . metronidazole Stopped (05/24/19 1012)   . atorvastatin  40 mg Oral QHS  . carvedilol  3.125 mg Oral BID WC  .  cholecalciferol  2,000 Units Oral Q breakfast  . clopidogrel  75 mg Oral Q breakfast  . ferrous sulfate  650 mg Oral Q breakfast  . fluticasone  2 spray Each Nare QHS  . furosemide  80 mg Oral Daily  . heparin  5,000 Units Subcutaneous Q8H  . loratadine  5 mg Oral QPM  . multivitamin with minerals  1 tablet Oral Q breakfast  . vancomycin variable dose per unstable renal function (pharmacist dosing)   Does not apply See admin instructions   acetaminophen **OR** acetaminophen, albuterol, magnesium hydroxide, nitroGLYCERIN, traZODone  Assessment/ Plan:  85 y.o. Caucasian male with end-stage renal disease on dialysis since January 2020, diabetes, coronary disease, dyslipidemia, hypertension, atrial fibrillation  Dry weight 99 kg, CCPD 4X 2500, tidal 80%,  #End-stage renal disease -We will continue CCPD while patient is hospitalized -4 x 2500 cc, 9 hours  #Fever, chills, sepsis - source not clear -We will get peritoneal dialysis fluid checked for  WBC -Negative COVID-19 PCR  #Anemia of chronic kidney disease -Hemoglobin 13.4.  No indication for Epogen. -We will continue to monitor closely  #Secondary hyperparathyroidism -Outpatient PTH 126 on December 2 -Not on binders at home.  We will monitor phosphorus closely.    LOS: 0 Ajia Chadderdon 12/31/202011:22 AM  [place], [place]  Note: This note was prepared with Dragon dictation. Any transcription errors are unintentional

## 2019-05-24 NOTE — ED Triage Notes (Signed)
Patient arrived from home via [facility].  He was preparing for peritoneal dialysis prep, daughter came over at 1015p and found that he had a  fever, chills, nauseous.  She told EMS he has not taken any bp meds in a few days bc bp has been low (180/80).  102.8 temp upon arrival.  EMS reports: hr 111 ST (normally in 32s) extensive cardiac hx as well as chronic kidney disease and diabetes 101.57f 25 et 30 RR clear lung sounds 100% on RA no line cbg 204 (doesn't take meds)

## 2019-05-24 NOTE — Progress Notes (Signed)
Peritoneal dialysis patient known at [facility]). Please contact me with any dialysis placement concerns.  Elvera Bicker Dialysis Coordinator [phone]

## 2019-05-24 NOTE — Sepsis Progress Note (Signed)
Notified bedside nurse of need to administer fluid bolus.  

## 2019-05-25 DIAGNOSIS — R6520 Severe sepsis without septic shock: Secondary | ICD-10-CM

## 2019-05-25 LAB — URINE CULTURE: Culture: NO GROWTH

## 2019-05-25 LAB — PHOSPHORUS: Phosphorus: 4 mg/dL (ref 2.5–4.6)

## 2019-05-25 LAB — BASIC METABOLIC PANEL
Anion gap: 9 (ref 5–15)
BUN: 43 mg/dL — ABNORMAL HIGH (ref 8–23)
CO2: 24 mmol/L (ref 22–32)
Calcium: 8.6 mg/dL — ABNORMAL LOW (ref 8.9–10.3)
Chloride: 105 mmol/L (ref 98–111)
Creatinine, Ser: 2.95 mg/dL — ABNORMAL HIGH (ref 0.61–1.24)
GFR calc Af Amer: 21 mL/min — ABNORMAL LOW (ref >60)
GFR calc non Af Amer: 18 mL/min — ABNORMAL LOW (ref >60)
Glucose, Bld: 122 mg/dL — ABNORMAL HIGH (ref 70–99)
Potassium: 4.3 mmol/L (ref 3.5–5.1)
Sodium: 138 mmol/L (ref 135–145)

## 2019-05-25 LAB — VANCOMYCIN, RANDOM: Vancomycin Rm: 11

## 2019-05-25 MED ORDER — VANCOMYCIN HCL 1250 MG/250ML IV SOLN
1250.0000 mg | Freq: Once | INTRAVENOUS | Status: AC
  Administered 2019-05-25: 05:00:00 1250 mg via INTRAVENOUS
  Filled 2019-05-25: qty 250

## 2019-05-25 NOTE — Progress Notes (Signed)
[facility] PROGRESS NOTE  Matthew [name] ZOX:[id number] DOB: 08/22/1930 DOA: 05/24/2019 PCP: Jodi Marble, MD  Assessment/Plan:  1. Sepsis, rule out bacteremia given unclear source/etiology.This is manifested by fever, tachycardia significant leukocytosis and lactic acid of 2.7. I do not believe the patient has severe sepsis or septic shock. The patient will be admitted to a medical monitored bed. We will continue gentle hydration with IV normal saline. Will follow blood cultures and continue broad-spectrum IV antibiotics at this time. Day one negative cultures so far.   2. End-stage renal disease on peritoneal dialysis.Nephrology consultation will be obtained for follow-up. I notified Dr. Candiss Norse regarding the patient.  3. Type 2 diabetes mellitus. The patient will be placed on supplemental coverage with Humalog.  4. Coronary artery disease. He will be continued on Plavix was statin therapy and beta-blocker therapy with as needed sublingual nitroglycerin.  5. Dyslipidemia.Statin therapy will be resumed.  6. Hypertension.We will continue Coreg.  7. Paroxysmal atrial fibrillation. We will continue amiodarone   Code Status: Full Code Family Communication: Daughter at bedside. Disposition Plan: D/C home.    Consultants:  Nephrology-Dr.Singh.  Procedures:  None  Antibiotics:  Cefepime 1 Gm- Day 2  /  Ciprofloxacin 500 mg tablet day 2 .  HPI/Subjective: [name], type diabetes mellitus, coronary artery disease and CHF, who presented to the emergency room withacute onset of fever and chills. He denies any cough or wheezing or dyspnea. He admits to rhinorrhea without sore throat or earache. He denied any loss of taste or smell or recent exposure to COVID-19. He takes MiraLAX for constipation and had slightly loose bowel movement  today. He feels generally weak when he gets up but denied any body aches. No chest pain or palpitations. No dysuria, oliguria or hematuria or flank pain. Upon presentation to the emergency room, temperature was 102.8 with a blood pressure of 153/82 and pulse of 109 with pulse currently of 95% on room air and respiratory rate of 27. Labs are remarkable for a BUN of 33 and creatinine of 2.88 and lactic acid 2.7 with procalcitonin less than 0.1. CBC showed leukocytosis of 13.3 with neutrophilia. Patient had 2 blood cultures. His COVID-19 antigen test came back negative. COVID-19 PCR is currently pending. Urinalysis was unremarkable. Portable chest ray showed stable cardiomegaly without CHF or evidence for pneumonia. The patient was empirically given 650 mg p.o. Tylenol with IV cefepime, vancomycin and Flagyl. He will be admitted to medical monitored bed for further evaluation and management.  Daughter at bedside is concerned about dad not receiving   dialysis. Also reports that he has an aortic dissection.   Objective: Vitals:   05/24/19 2344 05/25/19 0805  BP: 132/61 137/72  Pulse: 66 66  Resp: 16   Temp: 97.9 F (36.6 C) 98.1 F (36.7 C)  SpO2: 97% 95%    Intake/Output Summary (Last 24 hours) at 05/25/2019 1527 Last data filed at 05/25/2019 1527 Gross per 24 hour  Intake 1926.03 ml  Output 2225 ml  Net -298.97 ml   Filed Weights   05/24/19 0020 05/24/19 2100  Weight: 95.3 kg 97.5 kg    Exam:  Physical Exam: General:  No acute distress, laying in the bed  HEENT  anicteric, moist oral mucous membrane  Pulm/lungs  normal breathing effort, clear to auscultation  CVS/Heart  irregular rhythm, no rub or gallop  Abdomen:   Soft, nontender  Extremities:  No peripheral edema  Neurologic:  Alert, oriented  Skin:  No acute rashes  Access:  Peritoneal dialysis catheter in place      Data Reviewed: Basic Metabolic Panel: Recent Labs  Lab 05/24/19 0021 05/24/19 2028  05/25/19 0147 05/25/19 0647  NA 140 138  --  138  K 4.3 4.0  --  4.3  CL 101 104  --  105  CO2 24 24  --  24  GLUCOSE 178* 181*  --  122*  BUN 33* 42*  --  43*  CREATININE 2.88* 3.15*  --  2.95*  CALCIUM 9.4 8.7*  --  8.6*  PHOS  --   --  4.0  --    Liver Function Tests: Recent Labs  Lab 05/24/19 0021  AST 40  ALT 37  ALKPHOS 112  BILITOT 1.3*  PROT 7.1  ALBUMIN 3.7   No results for input(s): LIPASE, AMYLASE in the last 168 hours. No results for input(s): AMMONIA in the last 168 hours. CBC: Recent Labs  Lab 05/24/19 0021 05/24/19 2028  WBC 13.3* 10.0  NEUTROABS 11.8*  --   HGB 13.4 11.7*  HCT 41.7 36.7*  MCV 94.3 95.6  PLT 132* 117*   Cardiac Enzymes: No results for input(s): CKTOTAL, CKMB, CKMBINDEX, TROPONINI in the last 168 hours. BNP (last 3 results) No results for input(s): BNP in the last 8760 hours.  ProBNP (last 3 results) No results for input(s): PROBNP in the last 8760 hours.  CBG: No results for input(s): GLUCAP in the last 168 hours.  Recent Results (from the past 240 hour(s))  Blood Culture (routine x 2)     Status: None (Preliminary result)   Collection Time: 05/24/19 12:21 AM   Specimen: BLOOD  Result Value Ref Range Status   Specimen Description BLOOD BLOOD LEFT ARM  Final   Special Requests   Final    BOTTLES DRAWN AEROBIC AND ANAEROBIC Blood Culture adequate volume   Culture   Final    NO GROWTH 1 DAY Performed at [facility], [address] [place], [place] [address]    Report Status PENDING  Incomplete  Blood Culture (routine x 2)     Status: None (Preliminary result)   Collection Time: 05/24/19 12:21 AM   Specimen: BLOOD  Result Value Ref Range Status   Specimen Description BLOOD BLOOD LEFT HAND  Final   Special Requests   Final    BOTTLES DRAWN AEROBIC AND ANAEROBIC Blood Culture adequate volume   Culture   Final    NO GROWTH 1 DAY Performed at [facility], [address] [place], [place] [address]     Report Status PENDING  Incomplete  Urine culture     Status: None   Collection Time: 05/24/19 12:21 AM   Specimen: In/Out Cath Urine  Result Value Ref Range Status   Specimen Description   Final    IN/OUT CATH URINE Performed at [facility], [address] [place], [place] [address]    Special Requests   Final    NONE Performed at [facility], [address] [place], [place] [address]    Culture   Final    NO GROWTH Performed at [facility], [place] [address] [place], [place] [address]    Report Status 05/25/2019 FINAL  Final  SARS CORONAVIRUS 2 (TAT 6-24 HRS) Nasopharyngeal Nasopharyngeal Swab     Status: None   Collection Time: 05/24/19  1:51 AM   Specimen: Nasopharyngeal Swab  Result Value  Ref Range Status   SARS Coronavirus 2 NEGATIVE NEGATIVE Final    Comment: (NOTE) SARS-CoV-2 target nucleic acids are NOT DETECTED. The SARS-CoV-2 RNA is generally detectable in upper and lower respiratory specimens during the acute phase of infection. Negative results do not preclude SARS-CoV-2 infection, do not rule out co-infections with other pathogens, and should not be used as the sole basis for treatment or other patient management decisions. Negative results must be combined with clinical observations, patient history, and epidemiological information. The expected result is Negative. Fact Sheet for Patients: SugarRoll.be Fact Sheet for Healthcare Providers: https://www.woods-mathews.com/ This test is not yet approved or cleared by the [place] FDA and  has been authorized for detection and/or diagnosis of SARS-CoV-2 by FDA under an Emergency Use Authorization (EUA). This EUA will remain  in effect (meaning this test can be used) for the duration of the COVID-19 declaration under Section 56 4(b)(1) of the Act, 21 U.S.C. section 360bbb-3(b)(1), unless the authorization is terminated or revoked  sooner. Performed at [facility], [place] [address], [place], [place] [address]      Studies: DG Chest Port 1 View  Result Date: 05/24/2019 CLINICAL DATA:  Fever. EXAM: PORTABLE CHEST 1 VIEW COMPARISON:  Radiograph 02/15/2018 FINDINGS: Post median sternotomy and CABG. Stable cardiomegaly. Previous left dialysis catheter is been removed. No focal airspace disease, pulmonary edema, large pleural effusion or pneumothorax. Degenerative change in both shoulders. No acute osseous abnormalities are seen. IMPRESSION: 1. Stable cardiomegaly without congestive failure. 2. No evidence of pneumonia or focal airspace disease. Electronically Signed   By: Keith Rake M.D.   On: 05/24/2019 01:42    Scheduled Meds: . carvedilol  3.125 mg Oral BID WC  . cholecalciferol  2,000 Units Oral Q breakfast  . clopidogrel  75 mg Oral Q breakfast  . ferrous sulfate  650 mg Oral Q breakfast  . fluticasone  2 spray Each Nare QHS  . furosemide  40 mg Oral Daily  . gentamicin cream  1 application Topical Daily  . heparin  5,000 Units Subcutaneous Q8H  . loratadine  5 mg Oral QPM  . multivitamin with minerals  1 tablet Oral Q breakfast  . rosuvastatin  40 mg Oral q1800  . vancomycin variable dose per unstable renal function (pharmacist dosing)   Does not apply See admin instructions   Continuous Infusions: . sodium chloride 75 mL/hr at 05/25/19 1409  . ceFEPime (MAXIPIME) IV Stopped (05/25/19 0141)  . dialysis solution 1.5% low-MG/low-CA    . metronidazole 500 mg (05/25/19 9379)    Active Problems:   Sepsis ([place])    Time spent: 20 minutes.     [facility]  If 7PM-7AM, please contact night-coverage at [email or web address], password [email or web address] 05/25/2019, 3:27 PM  LOS: 1 day

## 2019-05-25 NOTE — Progress Notes (Signed)
CCPD Inititated   05/25/19 1330  Peritoneal Catheter Left lower abdomen  Placement Date/Time: (c) 05/24/19 2015   Catheter Location: Left lower abdomen  Site Assessment Clean;Dry;Intact;Pink  Drainage Description None  Catheter status Patent  Dressing Gauze/Drain sponge  Dressing Status Clean;Dry;Intact  Dressing Intervention Dressing changed  Cycler Setup  Total Number of Exchanges  (8 exchanges )  Fill Volume 2500  Dianeal Solution Dextrose 1.5% in 6000 mL  Last Fill Volume 0  Fill Time - Minute(s) 10  Dwell Time - Hour(s) 1  Dwell Time - Minute(s) 42  Fluid Balance - CCPD  Total Intake for Exchanges (mL) 21000 ml  Education / Care Plan  Dialysis Education Provided Yes  Documented Education in Care Plan Yes  Hand-Off documentation  Report given to (Full Name) Beatris Ship, RN   Report received from (Full Name) Graceann Congress, RN

## 2019-05-25 NOTE — Progress Notes (Signed)
Pre CCPD Assessment    05/25/19 1330  Neurological  Level of Consciousness Alert  Orientation Level Oriented X4  Respiratory  Respiratory Pattern Regular  Chest Assessment Chest expansion symmetrical  Cough None  Cardiac  Pulse Regular  Heart Sounds S1, S2  ECG Monitor Yes  Vascular  R Radial Pulse +2  L Radial Pulse +2  Peritoneal Catheter Left lower abdomen  Placement Date/Time: (c) 05/24/19 2015   Catheter Location: Left lower abdomen  Site Assessment Clean;Dry;Intact;Pink  Drainage Description None  Catheter status Patent  Dressing Gauze/Drain sponge  Dressing Status Clean;Dry;Intact  Dressing Intervention Dressing changed  Psychosocial  Psychosocial (WDL) WDL

## 2019-05-25 NOTE — Consult Note (Signed)
Pharmacy Antibiotic Note  Matthew [name] is a 86 y.o. male admitted on 05/24/2019 with sepsis.  Pharmacy has been consulted for Cefepime/Vancomycin dosing. Patient has ESRD (PD).     Vancomycin 2g given in the ED this morning.   Plan: 01/01 @ 0200 VR 11 mcg/mL. Will give a vanc 15 mg/kg (1250 mg IV x 1) and will recheck another random level w/ am labs, patient does PD dialysis so will have to dose per random levels.  Height: 6' (182.9 cm) Weight: 215 lb (97.5 kg) IBW/kg (Calculated) : 77.6  Temp (24hrs), Avg:98 F (36.7 C), Min:97.8 F (36.6 C), Max:98.2 F (36.8 C)  Recent Labs  Lab 05/24/19 0021 05/24/19 0319 05/24/19 2028 05/25/19 0147  WBC 13.3*  --  10.0  --   CREATININE 2.88*  --  3.15*  --   LATICACIDVEN 2.7* 1.8  --   --   VANCORANDOM  --   --   --  11    Estimated Creatinine Clearance: 20.4 mL/min (A) (by C-G formula based on SCr of 3.15 mg/dL (H)).    Allergies  Allergen Reactions  . Amoxicillin Diarrhea    Antimicrobials this admission: 12/31 Cefepime >>  12/31 Vancomcyin >>   Dose adjustments this admission:   Microbiology results:  BCx:  UCx:  Sputum:  MRSA PCR:   Thank you for allowing pharmacy to be a part of this patient's care.  Tobie Lords, PharmD, BCPS Clinical Pharmacist 05/25/2019 4:14 AM

## 2019-05-25 NOTE — Progress Notes (Signed)
[place], [place] 05/25/19  Subjective:   Hospital day # 1 Patient known to our practice from outpatient dialysis.  Presented to the hospital for complaints of subjective fever, high blood pressure and shivering at about 10 PM night of admission.  Was noted to have a temperature of 102.8 upon arrival to the ER.  Covid test is negative. Neuro: No complaints cvs: No chest pain or shortness of breath pulm: No cough or sputum production gi: Appetite is good.  No nausea or vomiting reported at present.  No diarrhea. Renal: Denies any problems with voiding.  Usually does peritoneal dialysis without problem 12/31 0701 - 01/01 0700 In: 2026 [I.V.:1432.1; IV Piggyback:593.9] Out: 1100 [Urine:1100] Chest x-ray showed stable cardiomegaly without congestive changes.  No pneumonia or focal airspace disease. Feels better today PD could not be done last night due to nurse availability  Objective:  Vital signs in last 24 hours:  Temp:  [97.8 F (36.6 C)-98.2 F (36.8 C)] 98.1 F (36.7 C) (01/01 0805) Pulse Rate:  [66-71] 66 (01/01 0805) Resp:  [16-17] 16 (12/31 2344) BP: (114-137)/(57-72) 137/72 (01/01 0805) SpO2:  [95 %-100 %] 95 % (01/01 0805) Weight:  [97.5 kg] 97.5 kg (12/31 2100)  Weight change: 2.268 kg Filed Weights   05/24/19 0020 05/24/19 2100  Weight: 95.3 kg 97.5 kg    Intake/Output:    Intake/Output Summary (Last 24 hours) at 05/25/2019 1213 Last data filed at 05/25/2019 1100 Gross per 24 hour  Intake 1926.03 ml  Output 1300 ml  Net 626.03 ml     Physical Exam: General:  No acute distress, laying in the bed  HEENT  anicteric, moist oral mucous membrane  Pulm/lungs  normal breathing effort, clear to auscultation  CVS/Heart  irregular rhythm, no rub or gallop  Abdomen:   Soft, nontender  Extremities:  No peripheral edema  Neurologic:  Alert, oriented  Skin:  No acute rashes  Access:  Peritoneal dialysis catheter in place       Basic  Metabolic Panel:  Recent Labs  Lab 05/24/19 0021 05/24/19 2028 05/25/19 0147 05/25/19 0647  NA 140 138  --  138  K 4.3 4.0  --  4.3  CL 101 104  --  105  CO2 24 24  --  24  GLUCOSE 178* 181*  --  122*  BUN 33* 42*  --  43*  CREATININE 2.88* 3.15*  --  2.95*  CALCIUM 9.4 8.7*  --  8.6*  PHOS  --   --  4.0  --      CBC: Recent Labs  Lab 05/24/19 0021 05/24/19 2028  WBC 13.3* 10.0  NEUTROABS 11.8*  --   HGB 13.4 11.7*  HCT 41.7 36.7*  MCV 94.3 95.6  PLT 132* 117*     No results found for: HEPBSAG, HEPBSAB, HEPBIGM    Microbiology:  Recent Results (from the past 240 hour(s))  Blood Culture (routine x 2)     Status: None (Preliminary result)   Collection Time: 05/24/19 12:21 AM   Specimen: BLOOD  Result Value Ref Range Status   Specimen Description BLOOD BLOOD LEFT ARM  Final   Special Requests   Final    BOTTLES DRAWN AEROBIC AND ANAEROBIC Blood Culture adequate volume   Culture   Final    NO GROWTH 1 DAY Performed at [facility], [address] [place], [place] [address]    Report Status PENDING  Incomplete  Blood Culture (routine x 2)  Status: None (Preliminary result)   Collection Time: 05/24/19 12:21 AM   Specimen: BLOOD  Result Value Ref Range Status   Specimen Description BLOOD BLOOD LEFT HAND  Final   Special Requests   Final    BOTTLES DRAWN AEROBIC AND ANAEROBIC Blood Culture adequate volume   Culture   Final    NO GROWTH 1 DAY Performed at [facility], [address], [place], [place] [address]    Report Status PENDING  Incomplete  Urine culture     Status: None   Collection Time: 05/24/19 12:21 AM   Specimen: In/Out Cath Urine  Result Value Ref Range Status   Specimen Description   Final    IN/OUT CATH URINE Performed at [facility], [address] [place], [place] [address]    Special Requests   Final    NONE Performed at [facility], [address] [place], [place] [address]     Culture   Final    NO GROWTH Performed at [facility], [place] [address] [place], [place] [address]    Report Status 05/25/2019 FINAL  Final  SARS CORONAVIRUS 2 (TAT 6-24 HRS) Nasopharyngeal Nasopharyngeal Swab     Status: None   Collection Time: 05/24/19  1:51 AM   Specimen: Nasopharyngeal Swab  Result Value Ref Range Status   SARS Coronavirus 2 NEGATIVE NEGATIVE Final    Comment: (NOTE) SARS-CoV-2 target nucleic acids are NOT DETECTED. The SARS-CoV-2 RNA is generally detectable in upper and lower respiratory specimens during the acute phase of infection. Negative results do not preclude SARS-CoV-2 infection, do not rule out co-infections with other pathogens, and should not be used as the sole basis for treatment or other patient management decisions. Negative results must be combined with clinical observations, patient history, and epidemiological information. The expected result is Negative. Fact Sheet for Patients: SugarRoll.be Fact Sheet for Healthcare Providers: https://www.woods-mathews.com/ This test is not yet approved or cleared by the [place] FDA and  has been authorized for detection and/or diagnosis of SARS-CoV-2 by FDA under an Emergency Use Authorization (EUA). This EUA will remain  in effect (meaning this test can be used) for the duration of the COVID-19 declaration under Section 56 4(b)(1) of the Act, 21 U.S.C. section 360bbb-3(b)(1), unless the authorization is terminated or revoked sooner. Performed at [facility], [place] [address] [place], [place] [address]     Coagulation Studies: Recent Labs    05/24/19 0021 05/24/19 2028  LABPROT 12.8 13.0  INR 1.0 1.0    Urinalysis: Recent Labs    05/24/19 0021  COLORURINE YELLOW*  LABSPEC 1.012  PHURINE 6.0  GLUCOSEU NEGATIVE  HGBUR NEGATIVE  BILIRUBINUR NEGATIVE  KETONESUR NEGATIVE  PROTEINUR 30*  NITRITE NEGATIVE  LEUKOCYTESUR NEGATIVE       Imaging: DG Chest Port 1 View  Result Date: 05/24/2019 CLINICAL DATA:  Fever. EXAM: PORTABLE CHEST 1 VIEW COMPARISON:  Radiograph 02/15/2018 FINDINGS: Post median sternotomy and CABG. Stable cardiomegaly. Previous left dialysis catheter is been removed. No focal airspace disease, pulmonary edema, large pleural effusion or pneumothorax. Degenerative change in both shoulders. No acute osseous abnormalities are seen. IMPRESSION: 1. Stable cardiomegaly without congestive failure. 2. No evidence of pneumonia or focal airspace disease. Electronically Signed   By: Keith Rake M.D.   On: 05/24/2019 01:42     Medications:   . sodium chloride Stopped (05/25/19 0526)  . ceFEPime (MAXIPIME) IV Stopped (05/25/19 0141)  . dialysis solution 1.5% low-MG/low-CA    .  metronidazole 500 mg (05/25/19 0927)   . carvedilol  3.125 mg Oral BID WC  . cholecalciferol  2,000 Units Oral Q breakfast  . clopidogrel  75 mg Oral Q breakfast  . ferrous sulfate  650 mg Oral Q breakfast  . fluticasone  2 spray Each Nare QHS  . furosemide  40 mg Oral Daily  . gentamicin cream  1 application Topical Daily  . heparin  5,000 Units Subcutaneous Q8H  . loratadine  5 mg Oral QPM  . multivitamin with minerals  1 tablet Oral Q breakfast  . rosuvastatin  40 mg Oral q1800  . vancomycin variable dose per unstable renal function (pharmacist dosing)   Does not apply See admin instructions   acetaminophen **OR** acetaminophen, albuterol, heparin, magnesium hydroxide, nitroGLYCERIN, traZODone  Assessment/ Plan:  86 y.o. Caucasian male with end-stage renal disease on dialysis since January 2020, diabetes, coronary disease, dyslipidemia, hypertension, atrial fibrillation  Dry weight 99 kg, CCPD 4X 2500, tidal 80%,  #End-stage renal disease -We will continue CCPD while patient is hospitalized -4 x 2500 cc, 9 hours  #Fever, chills, sepsis - source not clear -We will get peritoneal dialysis fluid checked for  WBC -Negative COVID-19 PCR - currently on broad spectrum Abx. WBC count improving   #Anemia of chronic kidney disease Lab Results  Component Value Date   HGB 11.7 (L) 05/24/2019   No indication for Epogen. -We will continue to monitor closely  #Secondary hyperparathyroidism -Outpatient PTH 126 on December 2 -Not on binders at home.  We will monitor phosphorus closely. Lab Results  Component Value Date   CALCIUM 8.6 (L) 05/25/2019   PHOS 4.0 05/25/2019       LOS: 1 Varvara Legault 1/1/202112:13 PM  [place], [place]  Note: This note was prepared with Dragon dictation. Any transcription errors are unintentional

## 2019-05-25 NOTE — Progress Notes (Signed)
D: Pt alert and oriented.Pt denies experiencing any pain at this time. Pt is experiencing anxiety in relation to not having received dialysis over a 2/3 day period. Pt acknowledges/understands they are to receive peritoneal dialysis today. This Probation officer has contacted dialysis to confirm pt is on the list and has relayed the message to the pt and pt's daughter.    A: Scheduled medications administered to pt, per MD orders. Support and encouragement provided. Frequent verbal contact made.  R: No adverse drug reactions noted. Pt complaint with medications and treatment plan. Pt interacts well with staff on the unit. Pt is stable at this time, will continue to monitor and provide care for as ordered.  Dialysis relayed to this writer that the pt is to be on dialysis over an 18 hr period d/t not missing prior dislysis.

## 2019-05-26 DIAGNOSIS — D696 Thrombocytopenia, unspecified: Secondary | ICD-10-CM

## 2019-05-26 LAB — BASIC METABOLIC PANEL
Anion gap: 11 (ref 5–15)
BUN: 33 mg/dL — ABNORMAL HIGH (ref 8–23)
CO2: 25 mmol/L (ref 22–32)
Calcium: 9 mg/dL (ref 8.9–10.3)
Chloride: 103 mmol/L (ref 98–111)
Creatinine, Ser: 2.55 mg/dL — ABNORMAL HIGH (ref 0.61–1.24)
GFR calc Af Amer: 25 mL/min — ABNORMAL LOW (ref >60)
GFR calc non Af Amer: 22 mL/min — ABNORMAL LOW (ref >60)
Glucose, Bld: 218 mg/dL — ABNORMAL HIGH (ref 70–99)
Potassium: 4 mmol/L (ref 3.5–5.1)
Sodium: 139 mmol/L (ref 135–145)

## 2019-05-26 LAB — VANCOMYCIN, RANDOM: Vancomycin Rm: 15

## 2019-05-26 MED ORDER — VANCOMYCIN HCL 1250 MG/250ML IV SOLN
1250.0000 mg | Freq: Once | INTRAVENOUS | Status: AC
  Administered 2019-05-26: 09:00:00 1250 mg via INTRAVENOUS
  Filled 2019-05-26: qty 250

## 2019-05-26 NOTE — Progress Notes (Signed)
MD order received in [facility] to discharge pt home today; verbally reviewed AVS with pt and his daughter; no new Rxs; pt has follow up appointment already scheduled with Dr Candiss Norse; no new questions voiced at this time; pt discharged via wheelchair by nursing to the visitor's entrance

## 2019-05-26 NOTE — Progress Notes (Signed)
Matthew [name]  MRN: [id number]  DOB/AGE: January 03, 1931 86 y.o.  Primary Care Physician:Tejan-Sie, Brandt Loosen, MD  Admit date: 05/24/2019  Chief Complaint:  Chief Complaint  Patient presents with  . Fever  . Nausea    S-Pt presented on  05/24/2019 with  Chief Complaint  Patient presents with  . Fever  . Nausea  .    Pt today feels better. Pt daughter present in the room.    Pt daughter main concern was "If pt can go home?   Medications . carvedilol  3.125 mg Oral BID WC  . cholecalciferol  2,000 Units Oral Q breakfast  . clopidogrel  75 mg Oral Q breakfast  . ferrous sulfate  650 mg Oral Q breakfast  . fluticasone  2 spray Each Nare QHS  . furosemide  40 mg Oral Daily  . gentamicin cream  1 application Topical Daily  . heparin  5,000 Units Subcutaneous Q8H  . loratadine  5 mg Oral QPM  . multivitamin with minerals  1 tablet Oral Q breakfast  . rosuvastatin  40 mg Oral q1800  . vancomycin variable dose per unstable renal function (pharmacist dosing)   Does not apply See admin instructions         QMV:HQION from the symptoms mentioned above,there are no other symptoms referable to all systems reviewed.  Physical Exam: Vital signs in last 24 hours: Temp:  [97.5 F (36.4 C)-98.4 F (36.9 C)] 98.4 F (36.9 C) (01/02 0740) Pulse Rate:  [61-74] 73 (01/02 0740) Resp:  [16-17] 16 (01/02 0027) BP: (119-136)/(63-85) 128/71 (01/02 0740) SpO2:  [95 %-98 %] 95 % (01/02 0740) Weight:  [100 kg] 100 kg (01/02 0851) Weight change:  Last BM Date: 05/24/19  Intake/Output from previous day: 01/01 0701 - 01/02 0700 In: 21240 [P.O.:240] Out: 2250 [Urine:2250] Total I/O In: 1189.7 [I.V.:1189.7] Out: -    Physical Exam: General- pt is awake,alert, oriented to time place and person Resp- No acute REsp distress, CTA B/L NO Rhonchi CVS- S1S2 regular in rate and rhythm GIT- BS+, soft, NT, ND EXT- NO LE Edema, Cyanosis Access patient has PD catheter in situ  Lab  Results: CBC Recent Labs    05/24/19 0021 05/24/19 2028  WBC 13.3* 10.0  HGB 13.4 11.7*  HCT 41.7 36.7*  PLT 132* 117*    BMET Recent Labs    05/25/19 0647 05/26/19 0503  NA 138 139  K 4.3 4.0  CL 105 103  CO2 24 25  GLUCOSE 122* 218*  BUN 43* 33*  CREATININE 2.95* 2.55*  CALCIUM 8.6* 9.0    MICRO Recent Results (from the past 240 hour(s))  Blood Culture (routine x 2)     Status: None (Preliminary result)   Collection Time: 05/24/19 12:21 AM   Specimen: BLOOD  Result Value Ref Range Status   Specimen Description BLOOD BLOOD LEFT ARM  Final   Special Requests   Final    BOTTLES DRAWN AEROBIC AND ANAEROBIC Blood Culture adequate volume   Culture   Final    NO GROWTH 2 DAYS Performed at [facility], [address], [place], [place] [address]    Report Status PENDING  Incomplete  Blood Culture (routine x 2)     Status: None (Preliminary result)   Collection Time: 05/24/19 12:21 AM   Specimen: BLOOD  Result Value Ref Range Status   Specimen Description BLOOD BLOOD LEFT HAND  Final   Special Requests   Final    BOTTLES DRAWN AEROBIC  AND ANAEROBIC Blood Culture adequate volume   Culture   Final    NO GROWTH 2 DAYS Performed at [facility], [address] [place], [place] [address]    Report Status PENDING  Incomplete  Urine culture     Status: None   Collection Time: 05/24/19 12:21 AM   Specimen: In/Out Cath Urine  Result Value Ref Range Status   Specimen Description   Final    IN/OUT CATH URINE Performed at [facility], [address], [place], [place] [address]    Special Requests   Final    NONE Performed at [facility], [address], [place], [place] [address]    Culture   Final    NO GROWTH Performed at [facility], [place] [address] [place], [place] [address]    Report Status 05/25/2019 FINAL  Final  SARS CORONAVIRUS 2 (TAT 6-24 HRS) Nasopharyngeal Nasopharyngeal Swab     Status: None    Collection Time: 05/24/19  1:51 AM   Specimen: Nasopharyngeal Swab  Result Value Ref Range Status   SARS Coronavirus 2 NEGATIVE NEGATIVE Final    Comment: (NOTE) SARS-CoV-2 target nucleic acids are NOT DETECTED. The SARS-CoV-2 RNA is generally detectable in upper and lower respiratory specimens during the acute phase of infection. Negative results do not preclude SARS-CoV-2 infection, do not rule out co-infections with other pathogens, and should not be used as the sole basis for treatment or other patient management decisions. Negative results must be combined with clinical observations, patient history, and epidemiological information. The expected result is Negative. Fact Sheet for Patients: SugarRoll.be Fact Sheet for Healthcare Providers: https://www.woods-mathews.com/ This test is not yet approved or cleared by the [place] FDA and  has been authorized for detection and/or diagnosis of SARS-CoV-2 by FDA under an Emergency Use Authorization (EUA). This EUA will remain  in effect (meaning this test can be used) for the duration of the COVID-19 declaration under Section 56 4(b)(1) of the Act, 21 U.S.C. section 360bbb-3(b)(1), unless the authorization is terminated or revoked sooner. Performed at [facility], [place] [address] [place], [place] [address]       Lab Results  Component Value Date   CALCIUM 9.0 05/26/2019   PHOS 4.0 05/25/2019               Impression:  Patient is a 86 year old  Caucasian male with past medical history of end-stage renal disease on  dialysis since January 2020, diabetes, coronary disease, dyslipidemia, hypertension, atrial fibrillation now admitted with fever    1)Renal ESRD patient is on PD Patient tolerated PD well Patient is on CCPD 2.5 L Times 4 exchanges Dry weight of 99 kg Duration of dialysis 10 hours  2)HTN blood pressure stable  Medication-  On Diuretics On Beta  blockers    3)Anemia of chronic disease No need of Epogen for now   4) secondary hyperparathyroidism -CKD Mineral-Bone Disorder  Secondary Hyperparathyroidism present   Phosphorus at goal. Calcium is at goal  5) Sepsis Patient is admitted with fever.   Patient is being currently treated for sepsis patient is on broad-spectrum antibiotics Patient WBC counts are better, clinically better Source of sepsis is not yet clear Blood Cultures are still pending  6) electrolytes   sodium Normonatremic   potassium Normokalemic    7)Acid base Co2 at goal     Plan:  Agree with current tx and plan PD  Fluid studies ordered but results pending  Patient  is clinically better Patient if discharged will be followed as an outpatient. Case discussed at length with patient, her daughter and the hospitalist team.    Jaris Kohles s [facility] 05/26/2019, 9:00 AM

## 2019-05-26 NOTE — Discharge Summary (Signed)
Physician Discharge Summary  Matthew Matthew [name] VZC:[id number] DOB: 07/25/1930 DOA: 05/24/2019  PCP: Matthew Marble, MD  Admit date: 05/24/2019 Discharge date: 05/26/2019  Recommendations for Outpatient Follow-up:  1. Routine outpatient care.  Monitor for recurrent fever.  Follow-up Information    Matthew Legato, MD Follow up.   Specialty: Nephrology Why: as per Dr. Marcell [name] information: [place] [address] [phone]            Discharge Diagnoses: Principal diagnosis is #1 1. Sepsis unclear etiology 2. Thrombocytopenia 3. ESRD on peritoneal dialysis with anemia of CKD 4. Paroxysmal atrial fibrillation 5. CAD 6. Continue beta-blocker, Plavix   Discharge Condition: improved Disposition: home  Diet recommendation: low sodium, heart healthy  Filed Weights   05/24/19 0020 05/24/19 2100 05/26/19 0851  Weight: 95.3 kg 97.5 kg 100 kg    History of present illness:  86 year old Matthew [name] PMH ESRD on peritoneal dialysis, diabetes mellitus type 2, COPD, CHF presented with fever, chills.  Found to be febrile, tachycardic tachypneic without hypoxia.  Admitted for sepsis of unclear etiology with fever, leukocytosis and lactic acidosis.  Hospital Course:  Patient's condition rapidly improved with resolution of fever, leukocytosis.  Etiology remains unclear culture data unrevealing and peritoneal fluid culture was never sent.  Discussed in detail with consulting physician Dr. Theador Matthew [name], as well as infectious disease doctor, and patient's primary nephrologist Matthew Matthew [name] via secure chat.  Also discussed in detail with daughter at bedside.  We discussed the risk/benefit of hospitalization versus discharge home, antibiotics versus monitoring.  He was the recommendation of primary nephrologist and infectious disease to discharge without antibiotics and I concur.  Daughter and patient also concur.  Given instructions to monitor for fever or worsening symptoms.  They  understand that peritonitis cannot be ruled out at this point.  Sepsis unclear etiology.  SARS-CoV-2, influenza negative.  Lactic acid, procalcitonin elevated on admission, also with leukocytosis on admission. --Afebrile greater than 48 hours, leukocytosis resolved.  Afebrile, vital signs stable.  No hypoxia.  Although differential would include peritonitis, he has no signs or symptoms of this.  Unfortunately culture data was not sent.  Urine culture no growth and blood cultures no growth to date.  Thrombocytopenia --Chronic.  Stable.  No further evaluation suggested.  ESRD on peritoneal dialysis with anemia of CKD --Followed by nephrology during hospitalization, peritoneal dialysis continued  Paroxysmal atrial fibrillation --Continue amiodarone  CAD --Continue beta-blocker, Plavix  Significant Hospital Events    12/31 admitted for sepsis of unclear etiology  12/31 nephrology consultation for continuation of peritoneal dialysis  Consults:   Nephrology  Procedures:     Significant Diagnostic Tests:   EKG sinus tachycardia, left bundle branch block, compared to previous study 01/2018, no acute changes  12/31 chest x-ray independently reviewed, no acute disease  Micro Data:   SARS-CoV-2 negative  Influenza AMB negative  12/31 urine culture no growth, final  12/21 blood cultures no growth to date  Antimicrobials:   Cefepime 12/30 >  Metronidazole 12/30 >   Vancomycin 12/30 > 12/31  Today's assessment: S: Feels very well today.  No nausea, vomiting or abdominal pain.  No fever.  No complaints.  Wants to go home. O: Vitals:  Vitals:   05/26/19 0027 05/26/19 0740  BP: 136/85 128/71  Pulse: 74 73  Resp: 16   Temp: (!) 97.5 F (36.4 C) 98.4 F (36.9 C)  SpO2: 98% 95%    Constitutional:   Appears calm and comfortable, appears  well. Respiratory:   CTA bilaterally, no w/r/r.   Respiratory effort normal. Cardiovascular:   RRR, no  m/r/g Abdomen:   Soft Psychiatric:   judgement and insight appear normal  Mental status o Mood, affect appropriate  BMP unremarkable given known end-stage renal disease on peritoneal dialysis.  Discharge Instructions  Discharge Instructions    Activity as tolerated - No restrictions   Complete by: As directed    Diet - low sodium heart healthy   Complete by: As directed    Discharge instructions   Complete by: As directed    Call your physician or seek immediate medical attention for fever, confusion, pain, vomiting or worsening of condition.     Allergies as of 05/26/2019      Reactions   Amoxicillin Diarrhea      Medication List    STOP taking these medications   atorvastatin 40 MG tablet Commonly known as: LIPITOR   carvedilol 3.125 MG tablet Commonly known as: COREG     TAKE these medications   amiodarone 200 MG tablet Commonly known as: PACERONE Take 200 mg by mouth daily.   ciprofloxacin 500 MG tablet Commonly known as: CIPRO Take 500 mg by mouth 2 (two) times daily. As directed for cath.   clopidogrel 75 MG tablet Commonly known as: PLAVIX Take 1 tablet (75 mg total) by mouth daily with breakfast.   fenofibrate 160 MG tablet Take 160 mg by mouth daily.   ferrous sulfate 325 (65 FE) MG tablet Take 325 mg by mouth 2 (two) times daily with a meal.   Fish Oil 1000 MG Caps Take 4,000 mg by mouth daily at 12 noon.   fluticasone 50 MCG/ACT nasal spray Commonly known as: FLONASE Place 2 sprays into both nostrils at bedtime.   furosemide 40 MG tablet Commonly known as: LASIX Take 2 tablets (80 mg total) by mouth 2 (two) times daily. What changed:   how much to take  when to take this   levocetirizine 5 MG tablet Commonly known as: XYZAL Take 10 mg by mouth every morning.   metoprolol succinate 50 MG 24 hr tablet Commonly known as: TOPROL-XL Take 50 mg by mouth daily. At lunch time only if systolic BP is above 527.   multivitamin Tabs  tablet Take 1 tablet by mouth daily.   nitroGLYCERIN 0.4 MG SL tablet Commonly known as: NITROSTAT Place 0.4 mg under the tongue every 5 (five) minutes as needed for chest pain.   potassium chloride SA 20 MEQ tablet Commonly known as: KLOR-CON Take 2 tablets (40 mEq total) by mouth daily.   ProAir HFA 108 (90 Base) MCG/ACT inhaler Generic drug: albuterol Inhale 2 puffs into the lungs every 6 (six) hours as needed.   rosuvastatin 40 MG tablet Commonly known as: CRESTOR Take 40 mg by mouth daily.   Vitamin D3 50 MCG (2000 UT) Tabs Take 2,000 Units by mouth daily with breakfast.      Allergies  Allergen Reactions   Amoxicillin Diarrhea    The results of significant diagnostics from this hospitalization (including imaging, microbiology, ancillary and laboratory) are listed below for reference.    Significant Diagnostic Studies: DG Chest Port 1 View  Result Date: 05/24/2019 CLINICAL DATA:  Fever. EXAM: PORTABLE CHEST 1 VIEW COMPARISON:  Radiograph 02/15/2018 FINDINGS: Post median sternotomy and CABG. Stable cardiomegaly. Previous left dialysis catheter is been removed. No focal airspace disease, pulmonary edema, large pleural effusion or pneumothorax. Degenerative change in both shoulders. No acute osseous abnormalities are  seen. IMPRESSION: 1. Stable cardiomegaly without congestive failure. 2. No evidence of pneumonia or focal airspace disease. Electronically Signed   By: Keith Rake M.D.   On: 05/24/2019 01:42    Microbiology: Recent Results (from the past 240 hour(s))  Blood Culture (routine x 2)     Status: None (Preliminary result)   Collection Time: 05/24/19 12:21 AM   Specimen: BLOOD  Result Value Ref Range Status   Specimen Description BLOOD BLOOD LEFT ARM  Final   Special Requests   Final    BOTTLES DRAWN AEROBIC AND ANAEROBIC Blood Culture adequate volume   Culture   Final    NO GROWTH 2 DAYS Performed at [facility], [address]  [place], [place] [address]    Report Status PENDING  Incomplete  Blood Culture (routine x 2)     Status: None (Preliminary result)   Collection Time: 05/24/19 12:21 AM   Specimen: BLOOD  Result Value Ref Range Status   Specimen Description BLOOD BLOOD LEFT HAND  Final   Special Requests   Final    BOTTLES DRAWN AEROBIC AND ANAEROBIC Blood Culture adequate volume   Culture   Final    NO GROWTH 2 DAYS Performed at [facility], [address], [place], [place] [address]    Report Status PENDING  Incomplete  Urine culture     Status: None   Collection Time: 05/24/19 12:21 AM   Specimen: In/Out Cath Urine  Result Value Ref Range Status   Specimen Description   Final    IN/OUT CATH URINE Performed at [facility], [address], [place], [place] [address]    Special Requests   Final    NONE Performed at [facility], [address], [place], [place] [address]    Culture   Final    NO GROWTH Performed at [facility], [place] [address] [place], [place] [address]    Report Status 05/25/2019 FINAL  Final  SARS CORONAVIRUS 2 (TAT 6-24 HRS) Nasopharyngeal Nasopharyngeal Swab     Status: None   Collection Time: 05/24/19  1:51 AM   Specimen: Nasopharyngeal Swab  Result Value Ref Range Status   SARS Coronavirus 2 NEGATIVE NEGATIVE Final    Comment: (NOTE) SARS-CoV-2 target nucleic acids are NOT DETECTED. The SARS-CoV-2 RNA is generally detectable in upper and lower respiratory specimens during the acute phase of infection. Negative results do not preclude SARS-CoV-2 infection, do not rule out co-infections with other pathogens, and should not be used as the sole basis for treatment or other patient management decisions. Negative results must be combined with clinical observations, patient history, and epidemiological information. The expected result is Negative. Fact Sheet for Patients: SugarRoll.be Fact Sheet for  Healthcare Providers: https://www.woods-mathews.com/ This test is not yet approved or cleared by the [place] FDA and  has been authorized for detection and/or diagnosis of SARS-CoV-2 by FDA under an Emergency Use Authorization (EUA). This EUA will remain  in effect (meaning this test can be used) for the duration of the COVID-19 declaration under Section 56 4(b)(1) of the Act, 21 U.S.C. section 360bbb-3(b)(1), unless the authorization is terminated or revoked sooner. Performed at [facility], [place] [address] [place], [place] [address]      Labs: Basic Metabolic Panel: Recent Labs  Lab 05/24/19 0021 05/24/19 2028 05/25/19 0147 05/25/19 0647 05/26/19 0503  NA 140 138  --  138 139  K 4.3 4.0  --  4.3 4.0  CL 101 104  --  105 103  CO2 24 24  --  24 25  GLUCOSE 178* 181*  --  122* 218*  BUN 33* 42*  --  43* 33*  CREATININE 2.88* 3.15*  --  2.95* 2.55*  CALCIUM 9.4 8.7*  --  8.6* 9.0  PHOS  --   --  4.0  --   --    Liver Function Tests: Recent Labs  Lab 05/24/19 0021  AST 40  ALT 37  ALKPHOS 112  BILITOT 1.3*  PROT 7.1  ALBUMIN 3.7   CBC: Recent Labs  Lab 05/24/19 0021 05/24/19 2028  WBC 13.3* 10.0  NEUTROABS 11.8*  --   HGB 13.4 11.7*  HCT 41.7 36.7*  MCV 94.3 95.6  PLT 132* 117*    Active Problems:   Sepsis (HCC)   Time coordinating discharge: 35 minutes  Signed:  Murray Hodgkins, MD  [facility]  05/26/2019, 4:39 PM

## 2019-05-26 NOTE — Consult Note (Signed)
Pharmacy Antibiotic Note  Matthew [name] is a 86 y.o. male admitted on 05/24/2019 with sepsis.  Pharmacy has been consulted for Cefepime/Vancomycin dosing. Patient has ESRD (PD).     Vancomycin 2g given in the ED this morning.   Plan: 01/02 @ 0500 VR 15 mcg/mL. Will give a vanc 15 mg/kg (1250 mg IV x 1) and will recheck another random level w/ am labs, patient does PD dialysis so will have to dose per random levels.  Height: 6' (182.9 cm) Weight: 215 lb (97.5 kg) IBW/kg (Calculated) : 77.6  Temp (24hrs), Avg:97.9 F (36.6 C), Min:97.5 F (36.4 C), Max:98.2 F (36.8 C)  Recent Labs  Lab 05/24/19 0021 05/24/19 0319 05/24/19 2028 05/25/19 0147 05/25/19 0647 05/26/19 0503  WBC 13.3*  --  10.0  --   --   --   CREATININE 2.88*  --  3.15*  --  2.95* 2.55*  LATICACIDVEN 2.7* 1.8  --   --   --   --   VANCORANDOM  --   --   --  11  --  15    Estimated Creatinine Clearance: 25.2 mL/min (A) (by C-G formula based on SCr of 2.55 mg/dL (H)).    Allergies  Allergen Reactions  . Amoxicillin Diarrhea    Antimicrobials this admission: 12/31 Cefepime >>  12/31 Vancomcyin >>   Dose adjustments this admission:   Microbiology results:  BCx:  UCx:  Sputum:  MRSA PCR:   Thank you for allowing pharmacy to be a part of this patient's care.  Tobie Lords, PharmD, BCPS Clinical Pharmacist 05/26/2019 6:26 AM

## 2019-05-26 NOTE — Plan of Care (Signed)
  Problem: Fluid Volume: Goal: Hemodynamic stability will improve Outcome: Adequate for Discharge   Problem: Clinical Measurements: Goal: Diagnostic test results will improve Outcome: Adequate for Discharge Goal: Signs and symptoms of infection will decrease Outcome: Adequate for Discharge   Problem: Respiratory: Goal: Ability to maintain adequate ventilation will improve Outcome: Adequate for Discharge   Problem: Elimination: Goal: Will not experience complications related to bowel motility Outcome: Adequate for Discharge Goal: Will not experience complications related to urinary retention Outcome: Adequate for Discharge   Problem: Pain Managment: Goal: General experience of comfort will improve Outcome: Adequate for Discharge   Problem: Safety: Goal: Ability to remain free from injury will improve Outcome: Adequate for Discharge   Problem: Education: Goal: Knowledge of General Education information will improve Description: Including pain rating scale, medication(s)/side effects and non-pharmacologic comfort measures Outcome: Adequate for Discharge

## 2019-05-26 NOTE — Progress Notes (Signed)
ccpd completed 

## 2019-05-29 LAB — CULTURE, BLOOD (ROUTINE X 2)
Culture: NO GROWTH
Culture: NO GROWTH
Special Requests: ADEQUATE
Special Requests: ADEQUATE

## 2019-05-31 DIAGNOSIS — E119 Type 2 diabetes mellitus without complications: Secondary | ICD-10-CM | POA: Diagnosis not present

## 2019-05-31 DIAGNOSIS — N186 End stage renal disease: Secondary | ICD-10-CM | POA: Diagnosis not present

## 2019-05-31 DIAGNOSIS — N2581 Secondary hyperparathyroidism of renal origin: Secondary | ICD-10-CM | POA: Diagnosis not present

## 2019-05-31 DIAGNOSIS — Z992 Dependence on renal dialysis: Secondary | ICD-10-CM | POA: Diagnosis not present

## 2019-05-31 DIAGNOSIS — D509 Iron deficiency anemia, unspecified: Secondary | ICD-10-CM | POA: Diagnosis not present

## 2019-05-31 DIAGNOSIS — Z298 Encounter for other specified prophylactic measures: Secondary | ICD-10-CM | POA: Diagnosis not present

## 2019-06-04 DIAGNOSIS — I10 Essential (primary) hypertension: Secondary | ICD-10-CM | POA: Diagnosis not present

## 2019-06-04 DIAGNOSIS — E663 Overweight: Secondary | ICD-10-CM | POA: Diagnosis not present

## 2019-06-04 DIAGNOSIS — Z9861 Coronary angioplasty status: Secondary | ICD-10-CM | POA: Diagnosis not present

## 2019-06-04 DIAGNOSIS — I2510 Atherosclerotic heart disease of native coronary artery without angina pectoris: Secondary | ICD-10-CM | POA: Diagnosis not present

## 2019-06-04 DIAGNOSIS — R0602 Shortness of breath: Secondary | ICD-10-CM | POA: Diagnosis not present

## 2019-06-04 DIAGNOSIS — E785 Hyperlipidemia, unspecified: Secondary | ICD-10-CM | POA: Diagnosis not present

## 2019-06-04 DIAGNOSIS — I25810 Atherosclerosis of coronary artery bypass graft(s) without angina pectoris: Secondary | ICD-10-CM | POA: Diagnosis not present

## 2019-06-05 DIAGNOSIS — I25810 Atherosclerosis of coronary artery bypass graft(s) without angina pectoris: Secondary | ICD-10-CM | POA: Diagnosis not present

## 2019-06-05 DIAGNOSIS — E785 Hyperlipidemia, unspecified: Secondary | ICD-10-CM | POA: Diagnosis not present

## 2019-06-05 DIAGNOSIS — R0602 Shortness of breath: Secondary | ICD-10-CM | POA: Diagnosis not present

## 2019-06-05 DIAGNOSIS — I10 Essential (primary) hypertension: Secondary | ICD-10-CM | POA: Diagnosis not present

## 2019-06-05 DIAGNOSIS — I2510 Atherosclerotic heart disease of native coronary artery without angina pectoris: Secondary | ICD-10-CM | POA: Diagnosis not present

## 2019-06-24 DIAGNOSIS — Z992 Dependence on renal dialysis: Secondary | ICD-10-CM | POA: Diagnosis not present

## 2019-06-24 DIAGNOSIS — N186 End stage renal disease: Secondary | ICD-10-CM | POA: Diagnosis not present

## 2019-06-25 DIAGNOSIS — N186 End stage renal disease: Secondary | ICD-10-CM | POA: Diagnosis not present

## 2019-06-25 DIAGNOSIS — N250 Renal osteodystrophy: Secondary | ICD-10-CM | POA: Diagnosis not present

## 2019-06-25 DIAGNOSIS — Z992 Dependence on renal dialysis: Secondary | ICD-10-CM | POA: Diagnosis not present

## 2019-06-25 DIAGNOSIS — D509 Iron deficiency anemia, unspecified: Secondary | ICD-10-CM | POA: Diagnosis not present

## 2019-07-22 DIAGNOSIS — N186 End stage renal disease: Secondary | ICD-10-CM | POA: Diagnosis not present

## 2019-07-22 DIAGNOSIS — Z992 Dependence on renal dialysis: Secondary | ICD-10-CM | POA: Diagnosis not present

## 2019-07-23 DIAGNOSIS — N186 End stage renal disease: Secondary | ICD-10-CM | POA: Diagnosis not present

## 2019-07-23 DIAGNOSIS — Z992 Dependence on renal dialysis: Secondary | ICD-10-CM | POA: Diagnosis not present

## 2019-07-23 DIAGNOSIS — D509 Iron deficiency anemia, unspecified: Secondary | ICD-10-CM | POA: Diagnosis not present

## 2019-07-23 DIAGNOSIS — N2581 Secondary hyperparathyroidism of renal origin: Secondary | ICD-10-CM | POA: Diagnosis not present

## 2019-07-31 DIAGNOSIS — E785 Hyperlipidemia, unspecified: Secondary | ICD-10-CM | POA: Diagnosis not present

## 2019-07-31 DIAGNOSIS — I10 Essential (primary) hypertension: Secondary | ICD-10-CM | POA: Diagnosis not present

## 2019-07-31 DIAGNOSIS — E663 Overweight: Secondary | ICD-10-CM | POA: Diagnosis not present

## 2019-07-31 DIAGNOSIS — E162 Hypoglycemia, unspecified: Secondary | ICD-10-CM | POA: Diagnosis not present

## 2019-07-31 DIAGNOSIS — E1122 Type 2 diabetes mellitus with diabetic chronic kidney disease: Secondary | ICD-10-CM | POA: Diagnosis not present

## 2019-08-01 DIAGNOSIS — I4891 Unspecified atrial fibrillation: Secondary | ICD-10-CM | POA: Diagnosis not present

## 2019-08-01 DIAGNOSIS — D649 Anemia, unspecified: Secondary | ICD-10-CM | POA: Diagnosis not present

## 2019-08-01 DIAGNOSIS — I351 Nonrheumatic aortic (valve) insufficiency: Secondary | ICD-10-CM | POA: Diagnosis not present

## 2019-08-01 DIAGNOSIS — E1122 Type 2 diabetes mellitus with diabetic chronic kidney disease: Secondary | ICD-10-CM | POA: Diagnosis not present

## 2019-08-01 DIAGNOSIS — L89159 Pressure ulcer of sacral region, unspecified stage: Secondary | ICD-10-CM | POA: Diagnosis not present

## 2019-08-01 DIAGNOSIS — I2510 Atherosclerotic heart disease of native coronary artery without angina pectoris: Secondary | ICD-10-CM | POA: Diagnosis not present

## 2019-08-01 DIAGNOSIS — G4700 Insomnia, unspecified: Secondary | ICD-10-CM | POA: Diagnosis not present

## 2019-08-01 DIAGNOSIS — I10 Essential (primary) hypertension: Secondary | ICD-10-CM | POA: Diagnosis not present

## 2019-08-01 DIAGNOSIS — N186 End stage renal disease: Secondary | ICD-10-CM | POA: Diagnosis not present

## 2019-08-01 DIAGNOSIS — E785 Hyperlipidemia, unspecified: Secondary | ICD-10-CM | POA: Diagnosis not present

## 2019-08-03 DIAGNOSIS — E663 Overweight: Secondary | ICD-10-CM | POA: Diagnosis not present

## 2019-08-03 DIAGNOSIS — I25810 Atherosclerosis of coronary artery bypass graft(s) without angina pectoris: Secondary | ICD-10-CM | POA: Diagnosis not present

## 2019-08-03 DIAGNOSIS — E785 Hyperlipidemia, unspecified: Secondary | ICD-10-CM | POA: Diagnosis not present

## 2019-08-03 DIAGNOSIS — I351 Nonrheumatic aortic (valve) insufficiency: Secondary | ICD-10-CM | POA: Diagnosis not present

## 2019-08-03 DIAGNOSIS — I10 Essential (primary) hypertension: Secondary | ICD-10-CM | POA: Diagnosis not present

## 2019-08-03 DIAGNOSIS — Z9861 Coronary angioplasty status: Secondary | ICD-10-CM | POA: Diagnosis not present

## 2019-08-03 DIAGNOSIS — I2510 Atherosclerotic heart disease of native coronary artery without angina pectoris: Secondary | ICD-10-CM | POA: Diagnosis not present

## 2019-08-03 DIAGNOSIS — I509 Heart failure, unspecified: Secondary | ICD-10-CM | POA: Diagnosis not present

## 2019-08-15 DIAGNOSIS — M25461 Effusion, right knee: Secondary | ICD-10-CM | POA: Diagnosis not present

## 2019-08-15 DIAGNOSIS — M25561 Pain in right knee: Secondary | ICD-10-CM | POA: Diagnosis not present

## 2019-08-15 DIAGNOSIS — M25562 Pain in left knee: Secondary | ICD-10-CM | POA: Diagnosis not present

## 2019-08-15 DIAGNOSIS — M170 Bilateral primary osteoarthritis of knee: Secondary | ICD-10-CM | POA: Diagnosis not present

## 2019-08-22 DIAGNOSIS — M25561 Pain in right knee: Secondary | ICD-10-CM | POA: Diagnosis not present

## 2019-08-22 DIAGNOSIS — M170 Bilateral primary osteoarthritis of knee: Secondary | ICD-10-CM | POA: Diagnosis not present

## 2019-08-22 DIAGNOSIS — N186 End stage renal disease: Secondary | ICD-10-CM | POA: Diagnosis not present

## 2019-08-22 DIAGNOSIS — Z992 Dependence on renal dialysis: Secondary | ICD-10-CM | POA: Diagnosis not present

## 2019-08-22 DIAGNOSIS — M25562 Pain in left knee: Secondary | ICD-10-CM | POA: Diagnosis not present

## 2019-08-23 DIAGNOSIS — E119 Type 2 diabetes mellitus without complications: Secondary | ICD-10-CM | POA: Diagnosis not present

## 2019-08-23 DIAGNOSIS — D509 Iron deficiency anemia, unspecified: Secondary | ICD-10-CM | POA: Diagnosis not present

## 2019-08-23 DIAGNOSIS — Z992 Dependence on renal dialysis: Secondary | ICD-10-CM | POA: Diagnosis not present

## 2019-08-23 DIAGNOSIS — N2581 Secondary hyperparathyroidism of renal origin: Secondary | ICD-10-CM | POA: Diagnosis not present

## 2019-08-23 DIAGNOSIS — N186 End stage renal disease: Secondary | ICD-10-CM | POA: Diagnosis not present

## 2019-08-30 DIAGNOSIS — I10 Essential (primary) hypertension: Secondary | ICD-10-CM | POA: Diagnosis not present

## 2019-08-30 DIAGNOSIS — E1122 Type 2 diabetes mellitus with diabetic chronic kidney disease: Secondary | ICD-10-CM | POA: Diagnosis not present

## 2019-08-30 DIAGNOSIS — I120 Hypertensive chronic kidney disease with stage 5 chronic kidney disease or end stage renal disease: Secondary | ICD-10-CM | POA: Diagnosis not present

## 2019-08-30 DIAGNOSIS — N186 End stage renal disease: Secondary | ICD-10-CM | POA: Diagnosis not present

## 2019-08-30 DIAGNOSIS — Z992 Dependence on renal dialysis: Secondary | ICD-10-CM | POA: Diagnosis not present

## 2019-08-30 DIAGNOSIS — N2581 Secondary hyperparathyroidism of renal origin: Secondary | ICD-10-CM | POA: Diagnosis not present

## 2019-08-30 DIAGNOSIS — E782 Mixed hyperlipidemia: Secondary | ICD-10-CM | POA: Diagnosis not present

## 2019-09-05 DIAGNOSIS — M25562 Pain in left knee: Secondary | ICD-10-CM | POA: Diagnosis not present

## 2019-09-05 DIAGNOSIS — M170 Bilateral primary osteoarthritis of knee: Secondary | ICD-10-CM | POA: Diagnosis not present

## 2019-09-05 DIAGNOSIS — M25561 Pain in right knee: Secondary | ICD-10-CM | POA: Diagnosis not present

## 2019-09-14 DIAGNOSIS — I4891 Unspecified atrial fibrillation: Secondary | ICD-10-CM | POA: Diagnosis not present

## 2019-09-14 DIAGNOSIS — E1122 Type 2 diabetes mellitus with diabetic chronic kidney disease: Secondary | ICD-10-CM | POA: Diagnosis not present

## 2019-09-14 DIAGNOSIS — G4700 Insomnia, unspecified: Secondary | ICD-10-CM | POA: Diagnosis not present

## 2019-09-14 DIAGNOSIS — I351 Nonrheumatic aortic (valve) insufficiency: Secondary | ICD-10-CM | POA: Diagnosis not present

## 2019-09-14 DIAGNOSIS — L89159 Pressure ulcer of sacral region, unspecified stage: Secondary | ICD-10-CM | POA: Diagnosis not present

## 2019-09-14 DIAGNOSIS — N186 End stage renal disease: Secondary | ICD-10-CM | POA: Diagnosis not present

## 2019-09-14 DIAGNOSIS — D649 Anemia, unspecified: Secondary | ICD-10-CM | POA: Diagnosis not present

## 2019-09-14 DIAGNOSIS — I10 Essential (primary) hypertension: Secondary | ICD-10-CM | POA: Diagnosis not present

## 2019-09-14 DIAGNOSIS — Z1331 Encounter for screening for depression: Secondary | ICD-10-CM | POA: Diagnosis not present

## 2019-09-14 DIAGNOSIS — E785 Hyperlipidemia, unspecified: Secondary | ICD-10-CM | POA: Diagnosis not present

## 2019-09-14 DIAGNOSIS — I2510 Atherosclerotic heart disease of native coronary artery without angina pectoris: Secondary | ICD-10-CM | POA: Diagnosis not present

## 2019-09-21 DIAGNOSIS — N186 End stage renal disease: Secondary | ICD-10-CM | POA: Diagnosis not present

## 2019-09-21 DIAGNOSIS — Z992 Dependence on renal dialysis: Secondary | ICD-10-CM | POA: Diagnosis not present

## 2019-09-22 DIAGNOSIS — N186 End stage renal disease: Secondary | ICD-10-CM | POA: Diagnosis not present

## 2019-09-22 DIAGNOSIS — N2581 Secondary hyperparathyroidism of renal origin: Secondary | ICD-10-CM | POA: Diagnosis not present

## 2019-09-22 DIAGNOSIS — Z992 Dependence on renal dialysis: Secondary | ICD-10-CM | POA: Diagnosis not present

## 2019-09-22 DIAGNOSIS — D509 Iron deficiency anemia, unspecified: Secondary | ICD-10-CM | POA: Diagnosis not present

## 2019-09-25 DIAGNOSIS — Z992 Dependence on renal dialysis: Secondary | ICD-10-CM | POA: Diagnosis not present

## 2019-10-17 ENCOUNTER — Ambulatory Visit: Payer: Medicare Other | Admitting: Dermatology

## 2019-10-19 ENCOUNTER — Ambulatory Visit: Payer: Medicare Other | Admitting: Dermatology

## 2019-10-22 DIAGNOSIS — Z992 Dependence on renal dialysis: Secondary | ICD-10-CM | POA: Diagnosis not present

## 2019-10-22 DIAGNOSIS — N186 End stage renal disease: Secondary | ICD-10-CM | POA: Diagnosis not present

## 2019-10-23 DIAGNOSIS — D509 Iron deficiency anemia, unspecified: Secondary | ICD-10-CM | POA: Diagnosis not present

## 2019-10-23 DIAGNOSIS — Z992 Dependence on renal dialysis: Secondary | ICD-10-CM | POA: Diagnosis not present

## 2019-10-23 DIAGNOSIS — C4442 Squamous cell carcinoma of skin of scalp and neck: Secondary | ICD-10-CM | POA: Insufficient documentation

## 2019-10-23 DIAGNOSIS — N186 End stage renal disease: Secondary | ICD-10-CM | POA: Diagnosis not present

## 2019-10-23 HISTORY — DX: Squamous cell carcinoma of skin of scalp and neck: C44.42

## 2019-10-24 ENCOUNTER — Other Ambulatory Visit: Payer: Self-pay

## 2019-10-24 ENCOUNTER — Ambulatory Visit (INDEPENDENT_AMBULATORY_CARE_PROVIDER_SITE_OTHER): Payer: Medicare Other | Admitting: Dermatology

## 2019-10-24 DIAGNOSIS — L821 Other seborrheic keratosis: Secondary | ICD-10-CM | POA: Diagnosis not present

## 2019-10-24 DIAGNOSIS — L578 Other skin changes due to chronic exposure to nonionizing radiation: Secondary | ICD-10-CM | POA: Diagnosis not present

## 2019-10-24 DIAGNOSIS — D692 Other nonthrombocytopenic purpura: Secondary | ICD-10-CM

## 2019-10-24 DIAGNOSIS — L820 Inflamed seborrheic keratosis: Secondary | ICD-10-CM

## 2019-10-24 DIAGNOSIS — D485 Neoplasm of uncertain behavior of skin: Secondary | ICD-10-CM

## 2019-10-24 DIAGNOSIS — Z992 Dependence on renal dialysis: Secondary | ICD-10-CM | POA: Diagnosis not present

## 2019-10-24 DIAGNOSIS — C4442 Squamous cell carcinoma of skin of scalp and neck: Secondary | ICD-10-CM | POA: Diagnosis not present

## 2019-10-24 NOTE — Patient Instructions (Addendum)
Recommend daily broad spectrum sunscreen SPF 30+ to sun-exposed areas, reapply every 2 hours as needed. Call for new or changing lesions.  Cryotherapy Aftercare  . Wash gently with soap and water everyday.   Marland Kitchen Apply Vaseline and Band-Aid daily until healed.  Wound Care Instructions  1. Cleanse wound gently with soap and water once a day then pat dry with clean gauze. Apply a thing coat of Petrolatum (petroleum jelly, "Vaseline") over the wound (unless you have an allergy to this). We recommend that you use a new, sterile tube of Vaseline. Do not pick or remove scabs. Do not remove the yellow or white "healing tissue" from the base of the wound.  2. Cover the wound with fresh, clean, nonstick gauze and secure with paper tape. You may use Band-Aids in place of gauze and tape if the would is small enough, but would recommend trimming much of the tape off as there is often too much. Sometimes Band-Aids can irritate the skin.  3. You should call the office for your biopsy report after 1 week if you have not already been contacted.  4. If you experience any problems, such as abnormal amounts of bleeding, swelling, significant bruising, significant pain, or evidence of infection, please call the office immediately.  5. FOR ADULT SURGERY PATIENTS: If you need something for pain relief you may take 1 extra strength Tylenol (acetaminophen) AND 2 Ibuprofen (200mg  each) together every 4 hours as needed for pain. (do not take these if you are allergic to them or if you have a reason you should not take them.) Typically, you may only need pain medication for 1 to 3 days.

## 2019-10-24 NOTE — Progress Notes (Signed)
New Patient Visit  Subjective  Matthew [name] is a 86 y.o. male who presents for the following: Skin Problem.  Patient here today for growth on left scalp. Present for about 1 year and has gotten larger. No itching, bleeding or pain.  Patient does not have a history of skin cancer but some areas treated with LN2 in the past.  Patient also has some scaly spots on face and scalp to be checked.   The following portions of the chart were reviewed this encounter and updated as appropriate:      Review of Systems:  No other skin or systemic complaints except as noted in HPI or Assessment and Plan.  Objective  Well appearing patient in no apparent distress; mood and affect are within normal limits.  A focused examination was performed including face, scalp, arms. Relevant physical exam findings are noted in the Assessment and Plan.  Objective  Left temporal scalp, crown, right temple, occipital scalp x 10 (10): Erythematous keratotic or waxy stuck-on papule or plaque.   Objective  Left Frontal Scalp: 1.2cm cutaneous horn     Objective  Bilateral arms: Violaceous macules and patches.    Assessment & Plan  Inflamed seborrheic keratosis (10) Left temporal scalp, crown, right temple, occipital scalp x 10  Vs Hypertrophic AK's Recheck on f/up  Destruction of lesion - Left temporal scalp, crown, right temple, occipital scalp x 10 Complexity: simple   Destruction method: cryotherapy   Informed consent: discussed and consent obtained   Lesion destroyed using liquid nitrogen: Yes   Region frozen until ice ball extended beyond lesion: Yes   Outcome: patient tolerated procedure well with no complications   Post-procedure details: wound care instructions given    Neoplasm of uncertain behavior of skin Left Frontal Scalp  Skin / nail biopsy Type of biopsy: tangential   Informed consent: discussed and consent obtained   Patient was prepped and draped in usual sterile  fashion: Area prepped with alcohol. Anesthesia: the lesion was anesthetized in a standard fashion   Anesthetic:  1% lidocaine w/ epinephrine 1-100,000 buffered w/ 8.4% NaHCO3 Instrument used: flexible razor blade   Hemostasis achieved with: pressure, aluminum chloride and electrodesiccation   Outcome: patient tolerated procedure well   Post-procedure details: wound care instructions given   Post-procedure details comment:  Ointment and small bandage applied  Destruction of lesion  Destruction method: electrodesiccation and curettage   Informed consent: discussed and consent obtained   Timeout:  patient name, date of birth, surgical site, and procedure verified Curettage performed in three different directions: Yes   Electrodesiccation performed over the curetted area: Yes   Lesion length (cm):  1.2 Lesion width (cm):  1.2 Margin per side (cm):  0.1 Final wound size (cm):  1.4 Hemostasis achieved with:  pressure, aluminum chloride and electrodesiccation Outcome: patient tolerated procedure well with no complications   Post-procedure details: wound care instructions given   Additional details:  Mupirocin ointment and Bandaid applied    Specimen 1 - Surgical pathology Differential Diagnosis: Cutaneous Horn r/o SCC Check Margins: No 1.2cm cutaneous horn  Senile purpura (HCC) Bilateral arms  Benign, observe.    Actinic Damage - diffuse scaly erythematous macules with underlying dyspigmentation - Recommend daily broad spectrum sunscreen SPF 30+ to sun-exposed areas, reapply every 2 hours as needed.  - Call for new or changing lesions.  Seborrheic Keratoses - Stuck-on, waxy, tan-brown papules and plaques  - Discussed benign etiology and prognosis. - Observe - Call for any  changes   Return in about 3 months (around 01/24/2020) for TBSE, F/up ISKs/AKs, Bx.  Graciella Belton, RMA, am acting as scribe for Brendolyn Patty, MD .  Documentation: I have reviewed the above  documentation for accuracy and completeness, and I agree with the above.  Brendolyn Patty MD

## 2019-10-30 ENCOUNTER — Telehealth: Payer: Self-pay

## 2019-10-30 NOTE — Telephone Encounter (Signed)
Advised patient biopsy on his left frontal scalp was SCC and was treated with EDC. Patient has follow-up appt scheduled in Sept 2021.

## 2019-10-30 NOTE — Telephone Encounter (Signed)
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

## 2019-11-02 DIAGNOSIS — I2510 Atherosclerotic heart disease of native coronary artery without angina pectoris: Secondary | ICD-10-CM | POA: Diagnosis not present

## 2019-11-06 DIAGNOSIS — I10 Essential (primary) hypertension: Secondary | ICD-10-CM | POA: Diagnosis not present

## 2019-11-06 DIAGNOSIS — I509 Heart failure, unspecified: Secondary | ICD-10-CM | POA: Diagnosis not present

## 2019-11-06 DIAGNOSIS — E663 Overweight: Secondary | ICD-10-CM | POA: Diagnosis not present

## 2019-11-06 DIAGNOSIS — I2510 Atherosclerotic heart disease of native coronary artery without angina pectoris: Secondary | ICD-10-CM | POA: Diagnosis not present

## 2019-11-06 DIAGNOSIS — Z9861 Coronary angioplasty status: Secondary | ICD-10-CM | POA: Diagnosis not present

## 2019-11-06 DIAGNOSIS — I25810 Atherosclerosis of coronary artery bypass graft(s) without angina pectoris: Secondary | ICD-10-CM | POA: Diagnosis not present

## 2019-11-06 DIAGNOSIS — E785 Hyperlipidemia, unspecified: Secondary | ICD-10-CM | POA: Diagnosis not present

## 2019-11-21 DIAGNOSIS — Z992 Dependence on renal dialysis: Secondary | ICD-10-CM | POA: Diagnosis not present

## 2019-11-21 DIAGNOSIS — N186 End stage renal disease: Secondary | ICD-10-CM | POA: Diagnosis not present

## 2019-11-22 DIAGNOSIS — N2581 Secondary hyperparathyroidism of renal origin: Secondary | ICD-10-CM | POA: Diagnosis not present

## 2019-11-22 DIAGNOSIS — N186 End stage renal disease: Secondary | ICD-10-CM | POA: Diagnosis not present

## 2019-11-22 DIAGNOSIS — D509 Iron deficiency anemia, unspecified: Secondary | ICD-10-CM | POA: Diagnosis not present

## 2019-11-22 DIAGNOSIS — Z992 Dependence on renal dialysis: Secondary | ICD-10-CM | POA: Diagnosis not present

## 2019-12-04 DIAGNOSIS — Z992 Dependence on renal dialysis: Secondary | ICD-10-CM | POA: Diagnosis not present

## 2019-12-04 DIAGNOSIS — E119 Type 2 diabetes mellitus without complications: Secondary | ICD-10-CM | POA: Diagnosis not present

## 2019-12-05 DIAGNOSIS — I120 Hypertensive chronic kidney disease with stage 5 chronic kidney disease or end stage renal disease: Secondary | ICD-10-CM | POA: Diagnosis not present

## 2019-12-05 DIAGNOSIS — N186 End stage renal disease: Secondary | ICD-10-CM | POA: Diagnosis not present

## 2019-12-05 DIAGNOSIS — E782 Mixed hyperlipidemia: Secondary | ICD-10-CM | POA: Diagnosis not present

## 2019-12-05 DIAGNOSIS — I10 Essential (primary) hypertension: Secondary | ICD-10-CM | POA: Diagnosis not present

## 2019-12-05 DIAGNOSIS — E1122 Type 2 diabetes mellitus with diabetic chronic kidney disease: Secondary | ICD-10-CM | POA: Diagnosis not present

## 2019-12-05 DIAGNOSIS — Z992 Dependence on renal dialysis: Secondary | ICD-10-CM | POA: Diagnosis not present

## 2019-12-05 DIAGNOSIS — N2581 Secondary hyperparathyroidism of renal origin: Secondary | ICD-10-CM | POA: Diagnosis not present

## 2019-12-12 DIAGNOSIS — M25561 Pain in right knee: Secondary | ICD-10-CM | POA: Diagnosis not present

## 2019-12-12 DIAGNOSIS — M25562 Pain in left knee: Secondary | ICD-10-CM | POA: Diagnosis not present

## 2019-12-12 DIAGNOSIS — M170 Bilateral primary osteoarthritis of knee: Secondary | ICD-10-CM | POA: Diagnosis not present

## 2019-12-12 DIAGNOSIS — M25461 Effusion, right knee: Secondary | ICD-10-CM | POA: Diagnosis not present

## 2019-12-19 DIAGNOSIS — M25562 Pain in left knee: Secondary | ICD-10-CM | POA: Diagnosis not present

## 2019-12-19 DIAGNOSIS — M170 Bilateral primary osteoarthritis of knee: Secondary | ICD-10-CM | POA: Diagnosis not present

## 2019-12-19 DIAGNOSIS — M25561 Pain in right knee: Secondary | ICD-10-CM | POA: Diagnosis not present

## 2019-12-22 DIAGNOSIS — Z992 Dependence on renal dialysis: Secondary | ICD-10-CM | POA: Diagnosis not present

## 2019-12-22 DIAGNOSIS — N186 End stage renal disease: Secondary | ICD-10-CM | POA: Diagnosis not present

## 2019-12-24 DIAGNOSIS — Z992 Dependence on renal dialysis: Secondary | ICD-10-CM | POA: Diagnosis not present

## 2019-12-24 DIAGNOSIS — D509 Iron deficiency anemia, unspecified: Secondary | ICD-10-CM | POA: Diagnosis not present

## 2019-12-24 DIAGNOSIS — Z23 Encounter for immunization: Secondary | ICD-10-CM | POA: Diagnosis not present

## 2019-12-24 DIAGNOSIS — N186 End stage renal disease: Secondary | ICD-10-CM | POA: Diagnosis not present

## 2019-12-25 DIAGNOSIS — Z992 Dependence on renal dialysis: Secondary | ICD-10-CM | POA: Diagnosis not present

## 2019-12-26 DIAGNOSIS — M25561 Pain in right knee: Secondary | ICD-10-CM | POA: Diagnosis not present

## 2019-12-26 DIAGNOSIS — M25562 Pain in left knee: Secondary | ICD-10-CM | POA: Diagnosis not present

## 2019-12-26 DIAGNOSIS — M170 Bilateral primary osteoarthritis of knee: Secondary | ICD-10-CM | POA: Diagnosis not present

## 2020-01-02 DIAGNOSIS — M170 Bilateral primary osteoarthritis of knee: Secondary | ICD-10-CM | POA: Diagnosis not present

## 2020-01-02 DIAGNOSIS — M25562 Pain in left knee: Secondary | ICD-10-CM | POA: Diagnosis not present

## 2020-01-02 DIAGNOSIS — M25561 Pain in right knee: Secondary | ICD-10-CM | POA: Diagnosis not present

## 2020-01-03 DIAGNOSIS — I10 Essential (primary) hypertension: Secondary | ICD-10-CM | POA: Diagnosis not present

## 2020-01-03 DIAGNOSIS — E663 Overweight: Secondary | ICD-10-CM | POA: Diagnosis not present

## 2020-01-03 DIAGNOSIS — E1122 Type 2 diabetes mellitus with diabetic chronic kidney disease: Secondary | ICD-10-CM | POA: Diagnosis not present

## 2020-01-03 DIAGNOSIS — E785 Hyperlipidemia, unspecified: Secondary | ICD-10-CM | POA: Diagnosis not present

## 2020-01-08 DIAGNOSIS — I4891 Unspecified atrial fibrillation: Secondary | ICD-10-CM | POA: Diagnosis not present

## 2020-01-08 DIAGNOSIS — I351 Nonrheumatic aortic (valve) insufficiency: Secondary | ICD-10-CM | POA: Diagnosis not present

## 2020-01-08 DIAGNOSIS — L89159 Pressure ulcer of sacral region, unspecified stage: Secondary | ICD-10-CM | POA: Diagnosis not present

## 2020-01-08 DIAGNOSIS — E032 Hypothyroidism due to medicaments and other exogenous substances: Secondary | ICD-10-CM | POA: Diagnosis not present

## 2020-01-08 DIAGNOSIS — N186 End stage renal disease: Secondary | ICD-10-CM | POA: Diagnosis not present

## 2020-01-08 DIAGNOSIS — D649 Anemia, unspecified: Secondary | ICD-10-CM | POA: Diagnosis not present

## 2020-01-08 DIAGNOSIS — E785 Hyperlipidemia, unspecified: Secondary | ICD-10-CM | POA: Diagnosis not present

## 2020-01-08 DIAGNOSIS — I10 Essential (primary) hypertension: Secondary | ICD-10-CM | POA: Diagnosis not present

## 2020-01-08 DIAGNOSIS — I2510 Atherosclerotic heart disease of native coronary artery without angina pectoris: Secondary | ICD-10-CM | POA: Diagnosis not present

## 2020-01-08 DIAGNOSIS — E1122 Type 2 diabetes mellitus with diabetic chronic kidney disease: Secondary | ICD-10-CM | POA: Diagnosis not present

## 2020-01-08 DIAGNOSIS — G4700 Insomnia, unspecified: Secondary | ICD-10-CM | POA: Diagnosis not present

## 2020-01-09 DIAGNOSIS — M170 Bilateral primary osteoarthritis of knee: Secondary | ICD-10-CM | POA: Diagnosis not present

## 2020-01-09 DIAGNOSIS — M25561 Pain in right knee: Secondary | ICD-10-CM | POA: Diagnosis not present

## 2020-01-09 DIAGNOSIS — M25562 Pain in left knee: Secondary | ICD-10-CM | POA: Diagnosis not present

## 2020-01-22 DIAGNOSIS — Z992 Dependence on renal dialysis: Secondary | ICD-10-CM | POA: Diagnosis not present

## 2020-01-22 DIAGNOSIS — N186 End stage renal disease: Secondary | ICD-10-CM | POA: Diagnosis not present

## 2020-01-23 DIAGNOSIS — N2581 Secondary hyperparathyroidism of renal origin: Secondary | ICD-10-CM | POA: Diagnosis not present

## 2020-01-23 DIAGNOSIS — Z992 Dependence on renal dialysis: Secondary | ICD-10-CM | POA: Diagnosis not present

## 2020-01-23 DIAGNOSIS — D509 Iron deficiency anemia, unspecified: Secondary | ICD-10-CM | POA: Diagnosis not present

## 2020-01-23 DIAGNOSIS — N186 End stage renal disease: Secondary | ICD-10-CM | POA: Diagnosis not present

## 2020-01-24 DIAGNOSIS — Z992 Dependence on renal dialysis: Secondary | ICD-10-CM | POA: Diagnosis not present

## 2020-02-05 ENCOUNTER — Ambulatory Visit (INDEPENDENT_AMBULATORY_CARE_PROVIDER_SITE_OTHER): Payer: Medicare Other | Admitting: Dermatology

## 2020-02-05 ENCOUNTER — Other Ambulatory Visit: Payer: Self-pay

## 2020-02-05 DIAGNOSIS — L578 Other skin changes due to chronic exposure to nonionizing radiation: Secondary | ICD-10-CM | POA: Diagnosis not present

## 2020-02-05 DIAGNOSIS — D692 Other nonthrombocytopenic purpura: Secondary | ICD-10-CM

## 2020-02-05 DIAGNOSIS — D229 Melanocytic nevi, unspecified: Secondary | ICD-10-CM | POA: Diagnosis not present

## 2020-02-05 DIAGNOSIS — L814 Other melanin hyperpigmentation: Secondary | ICD-10-CM

## 2020-02-05 DIAGNOSIS — L821 Other seborrheic keratosis: Secondary | ICD-10-CM

## 2020-02-05 DIAGNOSIS — Z1283 Encounter for screening for malignant neoplasm of skin: Secondary | ICD-10-CM | POA: Diagnosis not present

## 2020-02-05 DIAGNOSIS — Z85828 Personal history of other malignant neoplasm of skin: Secondary | ICD-10-CM | POA: Diagnosis not present

## 2020-02-05 DIAGNOSIS — L570 Actinic keratosis: Secondary | ICD-10-CM

## 2020-02-05 DIAGNOSIS — I872 Venous insufficiency (chronic) (peripheral): Secondary | ICD-10-CM | POA: Diagnosis not present

## 2020-02-05 DIAGNOSIS — D1800 Hemangioma unspecified site: Secondary | ICD-10-CM | POA: Diagnosis not present

## 2020-02-05 NOTE — Patient Instructions (Signed)
Cryotherapy Aftercare ° °• Wash gently with soap and water everyday.   °• Apply Vaseline and Band-Aid daily until healed. ° °Melanoma ABCDEs ° °Melanoma is the most dangerous type of skin cancer, and is the leading cause of death from skin disease.  You are more likely to develop melanoma if you: °· Have light-colored skin, light-colored eyes, or red or blond hair °· Spend a lot of time in the sun °· Tan regularly, either outdoors or in a tanning bed °· Have had blistering sunburns, especially during childhood °· Have a close family member who has had a melanoma °· Have atypical moles or large birthmarks ° °Early detection of melanoma is key since treatment is typically straightforward and cure rates are extremely high if we catch it early.  ° °The first sign of melanoma is often a change in a mole or a new dark spot.  The ABCDE system is a way of remembering the signs of melanoma. ° °A for asymmetry:  The two halves do not match. °B for border:  The edges of the growth are irregular. °C for color:  A mixture of colors are present instead of an even brown color. °D for diameter:  Melanomas are usually (but not always) greater than 6mm - the size of a pencil eraser. °E for evolution:  The spot keeps changing in size, shape, and color. ° °Please check your skin once per month between visits. You can use a small mirror in front and a large mirror behind you to keep an eye on the back side or your body.  ° °If you see any new or changing lesions before your next follow-up, please call to schedule a visit. ° °Please continue daily skin protection including broad spectrum sunscreen SPF 30+ to sun-exposed areas, reapplying every 2 hours as needed when you're outdoors.  ° °

## 2020-02-05 NOTE — Progress Notes (Signed)
Follow-Up Visit   Subjective  Matthew [name] is a 86 y.o. male who presents for the following: TBSE (Patient here today for TBSE. He had a SCC at the scalp treated in June with [facility]. Nothing new or changing patient is aware of. ).  Patient accompanied by daughter.   The following portions of the chart were reviewed this encounter and updated as appropriate:      Review of Systems:  No other skin or systemic complaints except as noted in HPI or Assessment and Plan.  Objective  Well appearing patient in no apparent distress; mood and affect are within normal limits.  A full examination was performed including scalp, head, eyes, ears, nose, lips, neck, chest, axillae, abdomen, back, buttocks, bilateral upper extremities, bilateral lower extremities, hands, feet, fingers, toes, fingernails, and toenails. All findings within normal limits unless otherwise noted below.  Objective  L frontal scalp x 6, vertex scalp x 1, R temple x 4, R forehead x 5, R malar cheek x 1, L lat eyebrow x 1, R paranasal x 1, R preauricular x 1 (20): Erythematous thin papules/macules with gritty scale, hypertrophic  Objective  Lower Legs: Stasis changes with edema   Assessment & Plan  AK (actinic keratosis) (20) L frontal scalp x 6, vertex scalp x 1, R temple x 4, R forehead x 5, R malar cheek x 1, L lat eyebrow x 1, R paranasal x 1, R preauricular x 1  Destruction of lesion - L frontal scalp x 6, vertex scalp x 1, R temple x 4, R forehead x 5, R malar cheek x 1, L lat eyebrow x 1, R paranasal x 1, R preauricular x 1  Destruction method: cryotherapy   Informed consent: discussed and consent obtained   Lesion destroyed using liquid nitrogen: Yes   Region frozen until ice ball extended beyond lesion: Yes   Outcome: patient tolerated procedure well with no complications   Post-procedure details: wound care instructions given    Venous stasis dermatitis of right lower extremity Lower Legs  Recommend  compression socks   Lentigines - Scattered tan macules forehead, scalp and arms - Discussed due to sun exposure - Benign, observe - Call for any changes  Seborrheic Keratoses - Stuck-on, waxy, tan-brown papules and plaques at face, chest - Discussed benign etiology and prognosis. - Observe - Call for any changes  Melanocytic Nevi - flesh-colored symmetric papule at chest - Benign appearing on exam today - Observation - Call clinic for new or changing moles - Recommend daily use of broad spectrum spf 30+ sunscreen to sun-exposed areas.   Hemangiomas - Red papules at chest - Discussed benign nature - Observe - Call for any changes  Actinic Damage - diffuse scaly erythematous macules with underlying dyspigmentation at forearms, face - Recommend daily broad spectrum sunscreen SPF 30+ to sun-exposed areas, reapply every 2 hours as needed.  - Call for new or changing lesions.  Skin cancer screening performed today.  History of Squamous Cell Carcinoma of the Skin - No evidence of recurrence today scalp treated with [facility] 6/21 - Recommend regular full body skin exams - Recommend daily broad spectrum sunscreen SPF 30+ to sun-exposed areas, reapply every 2 hours as needed.  - Call if any new or changing lesions are noted between office visits  Purpura - Violaceous macules and patches at arms - Benign - Related to age, sun damage and/or use of blood thinners - Observe - Can use OTC arnica containing moisturizer such as  Dermend Bruise Formula if desired - Call for worsening or other concerns  Return in about 3 months (around 05/06/2020) for AK follow up.  Graciella Belton, RMA, am acting as scribe for Brendolyn Patty, MD .  Documentation: I have reviewed the above documentation for accuracy and completeness, and I agree with the above.  Brendolyn Patty MD

## 2020-02-11 DIAGNOSIS — I10 Essential (primary) hypertension: Secondary | ICD-10-CM | POA: Diagnosis not present

## 2020-02-11 DIAGNOSIS — E785 Hyperlipidemia, unspecified: Secondary | ICD-10-CM | POA: Diagnosis not present

## 2020-02-11 DIAGNOSIS — I2510 Atherosclerotic heart disease of native coronary artery without angina pectoris: Secondary | ICD-10-CM | POA: Diagnosis not present

## 2020-02-11 DIAGNOSIS — I25810 Atherosclerosis of coronary artery bypass graft(s) without angina pectoris: Secondary | ICD-10-CM | POA: Diagnosis not present

## 2020-02-21 DIAGNOSIS — Z992 Dependence on renal dialysis: Secondary | ICD-10-CM | POA: Diagnosis not present

## 2020-02-21 DIAGNOSIS — N186 End stage renal disease: Secondary | ICD-10-CM | POA: Diagnosis not present

## 2020-02-22 DIAGNOSIS — D509 Iron deficiency anemia, unspecified: Secondary | ICD-10-CM | POA: Diagnosis not present

## 2020-02-22 DIAGNOSIS — N186 End stage renal disease: Secondary | ICD-10-CM | POA: Diagnosis not present

## 2020-02-22 DIAGNOSIS — Z992 Dependence on renal dialysis: Secondary | ICD-10-CM | POA: Diagnosis not present

## 2020-02-22 DIAGNOSIS — N2581 Secondary hyperparathyroidism of renal origin: Secondary | ICD-10-CM | POA: Diagnosis not present

## 2020-02-25 DIAGNOSIS — Z23 Encounter for immunization: Secondary | ICD-10-CM | POA: Diagnosis not present

## 2020-02-28 DIAGNOSIS — E119 Type 2 diabetes mellitus without complications: Secondary | ICD-10-CM | POA: Diagnosis not present

## 2020-03-10 DIAGNOSIS — I10 Essential (primary) hypertension: Secondary | ICD-10-CM | POA: Diagnosis not present

## 2020-03-10 DIAGNOSIS — E782 Mixed hyperlipidemia: Secondary | ICD-10-CM | POA: Diagnosis not present

## 2020-03-10 DIAGNOSIS — N186 End stage renal disease: Secondary | ICD-10-CM | POA: Diagnosis not present

## 2020-03-10 DIAGNOSIS — E1122 Type 2 diabetes mellitus with diabetic chronic kidney disease: Secondary | ICD-10-CM | POA: Diagnosis not present

## 2020-03-10 DIAGNOSIS — Z992 Dependence on renal dialysis: Secondary | ICD-10-CM | POA: Diagnosis not present

## 2020-03-10 DIAGNOSIS — I120 Hypertensive chronic kidney disease with stage 5 chronic kidney disease or end stage renal disease: Secondary | ICD-10-CM | POA: Diagnosis not present

## 2020-03-23 DIAGNOSIS — N186 End stage renal disease: Secondary | ICD-10-CM | POA: Diagnosis not present

## 2020-03-23 DIAGNOSIS — Z992 Dependence on renal dialysis: Secondary | ICD-10-CM | POA: Diagnosis not present

## 2020-03-24 DIAGNOSIS — N186 End stage renal disease: Secondary | ICD-10-CM | POA: Diagnosis not present

## 2020-03-24 DIAGNOSIS — Z992 Dependence on renal dialysis: Secondary | ICD-10-CM | POA: Diagnosis not present

## 2020-03-24 DIAGNOSIS — D509 Iron deficiency anemia, unspecified: Secondary | ICD-10-CM | POA: Diagnosis not present

## 2020-03-26 DIAGNOSIS — Z992 Dependence on renal dialysis: Secondary | ICD-10-CM | POA: Diagnosis not present

## 2020-04-11 DIAGNOSIS — H43813 Vitreous degeneration, bilateral: Secondary | ICD-10-CM | POA: Diagnosis not present

## 2020-04-14 DIAGNOSIS — E1122 Type 2 diabetes mellitus with diabetic chronic kidney disease: Secondary | ICD-10-CM | POA: Diagnosis not present

## 2020-04-14 DIAGNOSIS — E032 Hypothyroidism due to medicaments and other exogenous substances: Secondary | ICD-10-CM | POA: Diagnosis not present

## 2020-04-15 DIAGNOSIS — I10 Essential (primary) hypertension: Secondary | ICD-10-CM | POA: Diagnosis not present

## 2020-04-15 DIAGNOSIS — E1122 Type 2 diabetes mellitus with diabetic chronic kidney disease: Secondary | ICD-10-CM | POA: Diagnosis not present

## 2020-04-15 DIAGNOSIS — D649 Anemia, unspecified: Secondary | ICD-10-CM | POA: Diagnosis not present

## 2020-04-15 DIAGNOSIS — N186 End stage renal disease: Secondary | ICD-10-CM | POA: Diagnosis not present

## 2020-04-15 DIAGNOSIS — G4700 Insomnia, unspecified: Secondary | ICD-10-CM | POA: Diagnosis not present

## 2020-04-15 DIAGNOSIS — I351 Nonrheumatic aortic (valve) insufficiency: Secondary | ICD-10-CM | POA: Diagnosis not present

## 2020-04-15 DIAGNOSIS — J309 Allergic rhinitis, unspecified: Secondary | ICD-10-CM | POA: Diagnosis not present

## 2020-04-15 DIAGNOSIS — I4891 Unspecified atrial fibrillation: Secondary | ICD-10-CM | POA: Diagnosis not present

## 2020-04-15 DIAGNOSIS — L89159 Pressure ulcer of sacral region, unspecified stage: Secondary | ICD-10-CM | POA: Diagnosis not present

## 2020-04-15 DIAGNOSIS — I2510 Atherosclerotic heart disease of native coronary artery without angina pectoris: Secondary | ICD-10-CM | POA: Diagnosis not present

## 2020-04-15 DIAGNOSIS — E785 Hyperlipidemia, unspecified: Secondary | ICD-10-CM | POA: Diagnosis not present

## 2020-04-15 DIAGNOSIS — E032 Hypothyroidism due to medicaments and other exogenous substances: Secondary | ICD-10-CM | POA: Diagnosis not present

## 2020-04-22 DIAGNOSIS — N186 End stage renal disease: Secondary | ICD-10-CM | POA: Diagnosis not present

## 2020-04-22 DIAGNOSIS — Z992 Dependence on renal dialysis: Secondary | ICD-10-CM | POA: Diagnosis not present

## 2020-04-23 DIAGNOSIS — D509 Iron deficiency anemia, unspecified: Secondary | ICD-10-CM | POA: Diagnosis not present

## 2020-04-23 DIAGNOSIS — Z992 Dependence on renal dialysis: Secondary | ICD-10-CM | POA: Diagnosis not present

## 2020-04-23 DIAGNOSIS — N186 End stage renal disease: Secondary | ICD-10-CM | POA: Diagnosis not present

## 2020-04-28 DIAGNOSIS — Z992 Dependence on renal dialysis: Secondary | ICD-10-CM | POA: Diagnosis not present

## 2020-05-05 ENCOUNTER — Encounter: Payer: Self-pay | Admitting: Dermatology

## 2020-05-05 ENCOUNTER — Other Ambulatory Visit: Payer: Self-pay

## 2020-05-05 ENCOUNTER — Ambulatory Visit (INDEPENDENT_AMBULATORY_CARE_PROVIDER_SITE_OTHER): Payer: Medicare Other | Admitting: Dermatology

## 2020-05-05 DIAGNOSIS — L570 Actinic keratosis: Secondary | ICD-10-CM

## 2020-05-05 DIAGNOSIS — D044 Carcinoma in situ of skin of scalp and neck: Secondary | ICD-10-CM | POA: Diagnosis not present

## 2020-05-05 DIAGNOSIS — Z85828 Personal history of other malignant neoplasm of skin: Secondary | ICD-10-CM | POA: Diagnosis not present

## 2020-05-05 DIAGNOSIS — L578 Other skin changes due to chronic exposure to nonionizing radiation: Secondary | ICD-10-CM

## 2020-05-05 DIAGNOSIS — Z872 Personal history of diseases of the skin and subcutaneous tissue: Secondary | ICD-10-CM

## 2020-05-05 DIAGNOSIS — C4492 Squamous cell carcinoma of skin, unspecified: Secondary | ICD-10-CM

## 2020-05-05 DIAGNOSIS — D485 Neoplasm of uncertain behavior of skin: Secondary | ICD-10-CM

## 2020-05-05 HISTORY — DX: Squamous cell carcinoma of skin, unspecified: C44.92

## 2020-05-05 NOTE — Progress Notes (Addendum)
Follow-Up Visit   Subjective  Matthew [name] is a 86 y.o. male who presents for the following: Follow-up (Hx of AKs on the scalp and face).  H/o SCC scalp.   The following portions of the chart were reviewed this encounter and updated as appropriate:       Review of Systems:  No other skin or systemic complaints except as noted in HPI or Assessment and Plan.  Objective  Well appearing patient in no apparent distress; mood and affect are within normal limits.  A focused examination was performed including face, scalp. Relevant physical exam findings are noted in the Assessment and Plan.  Objective  nose x 1, scalp x 3 (4): Erythematous thin papules/macules with gritty scale.   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  The purpose is to decrease the number of clinically evident and subclinical PreCancerous lesions to prevent progression to development of skin cancer by chemically destroying early precancer changes that may or may not be visible.  It has been shown to reduce the risk of developing skin cancer in the treated area. As a result of treatment, redness, scaling, crusting, and open sores may occur during treatment course. One or more than one of these methods may be used and may have to be used several times to control, suppress and eliminate the PreCancerous changes. Discussed treatment  course, expected reaction, and possible side effects. - Will schedule photodynamic therapy to the scalp.  AK (actinic keratosis) (4) nose x 1, scalp x 3  Destruction of lesion - nose x 1, scalp x 3  Destruction method: cryotherapy   Informed consent: discussed and consent obtained   Lesion destroyed using liquid nitrogen: Yes   Region frozen until ice ball extended beyond lesion: Yes   Outcome: patient tolerated procedure well with no complications   Post-procedure details: wound care instructions given    Neoplasm of uncertain behavior of skin Left temporal hairline  Skin / nail biopsy Type of biopsy: tangential   Informed consent: discussed and consent obtained   Patient was prepped and draped in usual sterile fashion: Area prepped with alcohol. Anesthesia: the lesion was anesthetized in a standard fashion   Anesthetic:  1% lidocaine w/ epinephrine 1-100,000 buffered w/ 8.4% NaHCO3 Instrument used: flexible razor blade   Hemostasis achieved with: pressure, aluminum chloride and electrodesiccation   Outcome: patient tolerated procedure well   Post-procedure details: wound care instructions given   Post-procedure details comment:  Ointment and small bandage applied  Specimen 1 - Surgical pathology Differential Diagnosis: r/o BCC/SCC Check Margins: No 1.2cm pink scaly patch  If +, recommend EDC.  History of SCC (squamous cell carcinoma) of skin Left Frontal Scalp  Clear. Observe for recurrence. Call clinic for new or changing lesions.  Recommend regular skin exams, daily broad-spectrum spf 30+ sunscreen use, and photoprotection.     Return for PDT x 2 to the scalp. Schedule f/u with Dr Chauncey Cruel in 6 mos.Lindi Adie, CMA, am acting as scribe  for Brendolyn Patty, MD .  Documentation: I have reviewed the above documentation for accuracy and completeness, and I agree with the above.  Brendolyn Patty MD

## 2020-05-05 NOTE — Patient Instructions (Signed)
Cryotherapy Aftercare  . Wash gently with soap and water everyday.   Marland Kitchen Apply Vaseline and Band-Aid daily until healed.  Wound Care Instructions  1. Cleanse wound gently with soap and water once a day then pat dry with clean gauze. Apply a thing coat of Petrolatum (petroleum jelly, "Vaseline") over the wound (unless you have an allergy to this). We recommend that you use a new, sterile tube of Vaseline. Do not pick or remove scabs. Do not remove the yellow or white "healing tissue" from the base of the wound.  2. Cover the wound with fresh, clean, nonstick gauze and secure with paper tape. You may use Band-Aids in place of gauze and tape if the would is small enough, but would recommend trimming much of the tape off as there is often too much. Sometimes Band-Aids can irritate the skin.  3. You should call the office for your biopsy report after 1 week if you have not already been contacted.  4. If you experience any problems, such as abnormal amounts of bleeding, swelling, significant bruising, significant pain, or evidence of infection, please call the office immediately.   Severe, Confluent Chronic Actinic Changes with Pre-Cancerous Actinic Keratoses due to cumulative sun exposure/UV radiation exposure over time - Discussed "Field Treatment" Field treatment involves treatment of an entire area of skin that has confluent Actinic Changes (Sun/ Ultraviolet light damage) and PreCancerous Actinic Keratoses by method of PhotoDynamic Therapy (PDT) and/or prescription Topical Chemotherapy agents such as 5-fluorouracil, 5-fluorouracil/calcipotriene, and/or imiquimod.  The purpose is to decrease the number of clinically evident and subclinical PreCancerous lesions to prevent progression to development of skin cancer by chemically destroying early precancer changes that may or may not be visible.  It has been shown to reduce the risk of developing skin cancer in the treated area. As a result of treatment,  redness, scaling, crusting, and open sores may occur during treatment course. One or more than one of these methods may be used and may have to be used several times to control, suppress and eliminate the PreCancerous changes. Discussed treatment course, expected reaction, and possible side effects.

## 2020-05-07 ENCOUNTER — Telehealth: Payer: Self-pay

## 2020-05-07 NOTE — Telephone Encounter (Signed)
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

## 2020-05-07 NOTE — Telephone Encounter (Signed)
Left message for patient to call for biopsy results. 

## 2020-05-12 ENCOUNTER — Telehealth: Payer: Self-pay

## 2020-05-12 DIAGNOSIS — E663 Overweight: Secondary | ICD-10-CM | POA: Diagnosis not present

## 2020-05-12 DIAGNOSIS — Z9861 Coronary angioplasty status: Secondary | ICD-10-CM | POA: Diagnosis not present

## 2020-05-12 DIAGNOSIS — I10 Essential (primary) hypertension: Secondary | ICD-10-CM | POA: Diagnosis not present

## 2020-05-12 DIAGNOSIS — E785 Hyperlipidemia, unspecified: Secondary | ICD-10-CM | POA: Diagnosis not present

## 2020-05-12 DIAGNOSIS — R0602 Shortness of breath: Secondary | ICD-10-CM | POA: Diagnosis not present

## 2020-05-12 DIAGNOSIS — I25810 Atherosclerosis of coronary artery bypass graft(s) without angina pectoris: Secondary | ICD-10-CM | POA: Diagnosis not present

## 2020-05-12 DIAGNOSIS — I2510 Atherosclerotic heart disease of native coronary artery without angina pectoris: Secondary | ICD-10-CM | POA: Diagnosis not present

## 2020-05-12 NOTE — Telephone Encounter (Signed)
Discussed biopsy results with pt, return for treatment of EDC of SCCis

## 2020-05-13 ENCOUNTER — Ambulatory Visit (INDEPENDENT_AMBULATORY_CARE_PROVIDER_SITE_OTHER): Payer: Medicare Other | Admitting: Dermatology

## 2020-05-13 ENCOUNTER — Other Ambulatory Visit: Payer: Self-pay

## 2020-05-13 ENCOUNTER — Encounter: Payer: Self-pay | Admitting: Dermatology

## 2020-05-13 DIAGNOSIS — Z85828 Personal history of other malignant neoplasm of skin: Secondary | ICD-10-CM | POA: Diagnosis not present

## 2020-05-13 DIAGNOSIS — D044 Carcinoma in situ of skin of scalp and neck: Secondary | ICD-10-CM

## 2020-05-13 DIAGNOSIS — L578 Other skin changes due to chronic exposure to nonionizing radiation: Secondary | ICD-10-CM

## 2020-05-13 DIAGNOSIS — D099 Carcinoma in situ, unspecified: Secondary | ICD-10-CM

## 2020-05-13 NOTE — Patient Instructions (Signed)
Wound Care Instructions ° °1. Cleanse wound gently with soap and water once a day then pat dry with clean gauze. Apply a thing coat of Petrolatum (petroleum jelly, "Vaseline") over the wound (unless you have an allergy to this). We recommend that you use a new, sterile tube of Vaseline. Do not pick or remove scabs. Do not remove the yellow or white "healing tissue" from the base of the wound. ° °2. Cover the wound with fresh, clean, nonstick gauze and secure with paper tape. You may use Band-Aids in place of gauze and tape if the would is small enough, but would recommend trimming much of the tape off as there is often too much. Sometimes Band-Aids can irritate the skin. ° °3. You should call the office for your biopsy report after 1 week if you have not already been contacted. ° °4. If you experience any problems, such as abnormal amounts of bleeding, swelling, significant bruising, significant pain, or evidence of infection, please call the office immediately. ° ° ° °

## 2020-05-13 NOTE — Progress Notes (Signed)
   Follow-Up Visit   Subjective  Matthew [name] is a 86 y.o. male who presents for the following: SCC in situ (Left temporal scalp, treat with EDC today.).   The following portions of the chart were reviewed this encounter and updated as appropriate:       Review of Systems:  No other skin or systemic complaints except as noted in HPI or Assessment and Plan.  Objective  Well appearing patient in no apparent distress; mood and affect are within normal limits.  A focused examination was performed including face, scalp. Relevant physical exam findings are noted in the Assessment and Plan.  Objective  Left Temporal Scalp: Pink biopsy site.  Objective  Left Frontal Scalp: Well healed scar with no evidence of recurrence.    Assessment & Plan   Actinic Damage - Severe, chronic, secondary to cumulative UV radiation exposure over time - diffuse scaly erythematous macules and papules with underlying dyspigmentation - Discussed Prescription "Field Treatment" for Severe, Chronic Confluent Actinic Changes with Pre-Cancerous Actinic Keratoses Field treatment involves treatment of an entire area of skin that has confluent Actinic Changes (Sun/ Ultraviolet light damage) and PreCancerous Actinic Keratoses by method of PhotoDynamic Therapy (PDT) and/or prescription Topical Chemotherapy agents such as 5-fluorouracil, 5-fluorouracil/calcipotriene, and/or imiquimod.  The purpose is to decrease the number of clinically evident and subclinical PreCancerous lesions to prevent progression to development of skin cancer by chemically destroying early precancer changes that may or may not be visible.  It has been shown to reduce the risk of developing skin cancer in the treated area. As a result of treatment, redness, scaling, crusting, and open sores may occur during treatment course. One or more than one of these methods may be used and may have to be used several times to control, suppress and eliminate the  PreCancerous changes. Discussed treatment course, expected reaction, and possible side effects. - Recommend daily broad spectrum sunscreen SPF 30+ to sun-exposed areas, reapply every 2 hours as needed.  - Call for new or changing lesions. 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  Destruction of lesion  Destruction method: electrodesiccation and curettage   Informed consent: discussed and consent obtained   Timeout:  patient name, date of birth, surgical site, and procedure verified Curettage performed in three different directions: Yes   Electrodesiccation performed over the curetted area: Yes   Lesion length (cm):  1.2 Lesion width (cm):  1.2 Margin per side (cm):  0 Final wound size (cm):  1.2 Hemostasis achieved with:  pressure, aluminum chloride and electrodesiccation Outcome: patient tolerated procedure well with no complications   Post-procedure details: wound care instructions given    History of SCC (squamous cell carcinoma) of skin Left Frontal Scalp  Clear. Observe for recurrence. Call clinic for new or changing lesions.  Recommend regular skin exams, daily broad-spectrum spf 30+ sunscreen use, and photoprotection.     Return as scheduled for PDT scalp x 2.   IJamesetta Orleans, CMA, am acting as scribe for Brendolyn Patty, MD .  Documentation: I have reviewed the above documentation for accuracy and completeness, and I agree with the above.  Brendolyn Patty MD

## 2020-05-22 DIAGNOSIS — I340 Nonrheumatic mitral (valve) insufficiency: Secondary | ICD-10-CM | POA: Diagnosis not present

## 2020-05-23 DIAGNOSIS — Z992 Dependence on renal dialysis: Secondary | ICD-10-CM | POA: Diagnosis not present

## 2020-05-23 DIAGNOSIS — N186 End stage renal disease: Secondary | ICD-10-CM | POA: Diagnosis not present

## 2020-05-24 DIAGNOSIS — Z992 Dependence on renal dialysis: Secondary | ICD-10-CM | POA: Diagnosis not present

## 2020-05-24 DIAGNOSIS — N2581 Secondary hyperparathyroidism of renal origin: Secondary | ICD-10-CM | POA: Diagnosis not present

## 2020-05-24 DIAGNOSIS — N186 End stage renal disease: Secondary | ICD-10-CM | POA: Diagnosis not present

## 2020-05-24 DIAGNOSIS — D509 Iron deficiency anemia, unspecified: Secondary | ICD-10-CM | POA: Diagnosis not present

## 2020-05-26 DIAGNOSIS — N2581 Secondary hyperparathyroidism of renal origin: Secondary | ICD-10-CM | POA: Diagnosis not present

## 2020-05-26 DIAGNOSIS — N186 End stage renal disease: Secondary | ICD-10-CM | POA: Diagnosis not present

## 2020-05-26 DIAGNOSIS — Z992 Dependence on renal dialysis: Secondary | ICD-10-CM | POA: Diagnosis not present

## 2020-05-26 DIAGNOSIS — I25810 Atherosclerosis of coronary artery bypass graft(s) without angina pectoris: Secondary | ICD-10-CM | POA: Diagnosis not present

## 2020-05-26 DIAGNOSIS — E785 Hyperlipidemia, unspecified: Secondary | ICD-10-CM | POA: Diagnosis not present

## 2020-05-26 DIAGNOSIS — R0602 Shortness of breath: Secondary | ICD-10-CM | POA: Diagnosis not present

## 2020-05-26 DIAGNOSIS — I10 Essential (primary) hypertension: Secondary | ICD-10-CM | POA: Diagnosis not present

## 2020-05-26 DIAGNOSIS — I2510 Atherosclerotic heart disease of native coronary artery without angina pectoris: Secondary | ICD-10-CM | POA: Diagnosis not present

## 2020-05-26 DIAGNOSIS — D509 Iron deficiency anemia, unspecified: Secondary | ICD-10-CM | POA: Diagnosis not present

## 2020-05-27 DIAGNOSIS — D509 Iron deficiency anemia, unspecified: Secondary | ICD-10-CM | POA: Diagnosis not present

## 2020-05-27 DIAGNOSIS — N186 End stage renal disease: Secondary | ICD-10-CM | POA: Diagnosis not present

## 2020-05-27 DIAGNOSIS — E119 Type 2 diabetes mellitus without complications: Secondary | ICD-10-CM | POA: Diagnosis not present

## 2020-05-27 DIAGNOSIS — I259 Chronic ischemic heart disease, unspecified: Secondary | ICD-10-CM | POA: Diagnosis not present

## 2020-05-27 DIAGNOSIS — Z992 Dependence on renal dialysis: Secondary | ICD-10-CM | POA: Diagnosis not present

## 2020-05-27 DIAGNOSIS — N2581 Secondary hyperparathyroidism of renal origin: Secondary | ICD-10-CM | POA: Diagnosis not present

## 2020-05-28 DIAGNOSIS — N2581 Secondary hyperparathyroidism of renal origin: Secondary | ICD-10-CM | POA: Diagnosis not present

## 2020-05-28 DIAGNOSIS — N186 End stage renal disease: Secondary | ICD-10-CM | POA: Diagnosis not present

## 2020-05-28 DIAGNOSIS — Z992 Dependence on renal dialysis: Secondary | ICD-10-CM | POA: Diagnosis not present

## 2020-05-28 DIAGNOSIS — D509 Iron deficiency anemia, unspecified: Secondary | ICD-10-CM | POA: Diagnosis not present

## 2020-05-29 DIAGNOSIS — Z992 Dependence on renal dialysis: Secondary | ICD-10-CM | POA: Diagnosis not present

## 2020-05-29 DIAGNOSIS — N2581 Secondary hyperparathyroidism of renal origin: Secondary | ICD-10-CM | POA: Diagnosis not present

## 2020-05-29 DIAGNOSIS — N186 End stage renal disease: Secondary | ICD-10-CM | POA: Diagnosis not present

## 2020-05-29 DIAGNOSIS — D509 Iron deficiency anemia, unspecified: Secondary | ICD-10-CM | POA: Diagnosis not present

## 2020-05-30 DIAGNOSIS — N2581 Secondary hyperparathyroidism of renal origin: Secondary | ICD-10-CM | POA: Diagnosis not present

## 2020-05-30 DIAGNOSIS — Z992 Dependence on renal dialysis: Secondary | ICD-10-CM | POA: Diagnosis not present

## 2020-05-30 DIAGNOSIS — D509 Iron deficiency anemia, unspecified: Secondary | ICD-10-CM | POA: Diagnosis not present

## 2020-05-30 DIAGNOSIS — N186 End stage renal disease: Secondary | ICD-10-CM | POA: Diagnosis not present

## 2020-05-31 DIAGNOSIS — N2581 Secondary hyperparathyroidism of renal origin: Secondary | ICD-10-CM | POA: Diagnosis not present

## 2020-05-31 DIAGNOSIS — D509 Iron deficiency anemia, unspecified: Secondary | ICD-10-CM | POA: Diagnosis not present

## 2020-05-31 DIAGNOSIS — Z992 Dependence on renal dialysis: Secondary | ICD-10-CM | POA: Diagnosis not present

## 2020-05-31 DIAGNOSIS — N186 End stage renal disease: Secondary | ICD-10-CM | POA: Diagnosis not present

## 2020-06-02 DIAGNOSIS — Z992 Dependence on renal dialysis: Secondary | ICD-10-CM | POA: Diagnosis not present

## 2020-06-02 DIAGNOSIS — N186 End stage renal disease: Secondary | ICD-10-CM | POA: Diagnosis not present

## 2020-06-02 DIAGNOSIS — D509 Iron deficiency anemia, unspecified: Secondary | ICD-10-CM | POA: Diagnosis not present

## 2020-06-02 DIAGNOSIS — N2581 Secondary hyperparathyroidism of renal origin: Secondary | ICD-10-CM | POA: Diagnosis not present

## 2020-06-03 DIAGNOSIS — D509 Iron deficiency anemia, unspecified: Secondary | ICD-10-CM | POA: Diagnosis not present

## 2020-06-03 DIAGNOSIS — N2581 Secondary hyperparathyroidism of renal origin: Secondary | ICD-10-CM | POA: Diagnosis not present

## 2020-06-03 DIAGNOSIS — N186 End stage renal disease: Secondary | ICD-10-CM | POA: Diagnosis not present

## 2020-06-03 DIAGNOSIS — Z992 Dependence on renal dialysis: Secondary | ICD-10-CM | POA: Diagnosis not present

## 2020-06-04 DIAGNOSIS — N2581 Secondary hyperparathyroidism of renal origin: Secondary | ICD-10-CM | POA: Diagnosis not present

## 2020-06-04 DIAGNOSIS — N186 End stage renal disease: Secondary | ICD-10-CM | POA: Diagnosis not present

## 2020-06-04 DIAGNOSIS — D509 Iron deficiency anemia, unspecified: Secondary | ICD-10-CM | POA: Diagnosis not present

## 2020-06-04 DIAGNOSIS — Z992 Dependence on renal dialysis: Secondary | ICD-10-CM | POA: Diagnosis not present

## 2020-06-05 DIAGNOSIS — N2581 Secondary hyperparathyroidism of renal origin: Secondary | ICD-10-CM | POA: Diagnosis not present

## 2020-06-05 DIAGNOSIS — Z992 Dependence on renal dialysis: Secondary | ICD-10-CM | POA: Diagnosis not present

## 2020-06-05 DIAGNOSIS — N186 End stage renal disease: Secondary | ICD-10-CM | POA: Diagnosis not present

## 2020-06-05 DIAGNOSIS — D509 Iron deficiency anemia, unspecified: Secondary | ICD-10-CM | POA: Diagnosis not present

## 2020-06-06 DIAGNOSIS — N2581 Secondary hyperparathyroidism of renal origin: Secondary | ICD-10-CM | POA: Diagnosis not present

## 2020-06-06 DIAGNOSIS — Z992 Dependence on renal dialysis: Secondary | ICD-10-CM | POA: Diagnosis not present

## 2020-06-06 DIAGNOSIS — N186 End stage renal disease: Secondary | ICD-10-CM | POA: Diagnosis not present

## 2020-06-06 DIAGNOSIS — D509 Iron deficiency anemia, unspecified: Secondary | ICD-10-CM | POA: Diagnosis not present

## 2020-06-07 DIAGNOSIS — N2581 Secondary hyperparathyroidism of renal origin: Secondary | ICD-10-CM | POA: Diagnosis not present

## 2020-06-07 DIAGNOSIS — Z992 Dependence on renal dialysis: Secondary | ICD-10-CM | POA: Diagnosis not present

## 2020-06-07 DIAGNOSIS — N186 End stage renal disease: Secondary | ICD-10-CM | POA: Diagnosis not present

## 2020-06-07 DIAGNOSIS — D509 Iron deficiency anemia, unspecified: Secondary | ICD-10-CM | POA: Diagnosis not present

## 2020-06-09 DIAGNOSIS — N2581 Secondary hyperparathyroidism of renal origin: Secondary | ICD-10-CM | POA: Diagnosis not present

## 2020-06-09 DIAGNOSIS — Z992 Dependence on renal dialysis: Secondary | ICD-10-CM | POA: Diagnosis not present

## 2020-06-09 DIAGNOSIS — N186 End stage renal disease: Secondary | ICD-10-CM | POA: Diagnosis not present

## 2020-06-09 DIAGNOSIS — D509 Iron deficiency anemia, unspecified: Secondary | ICD-10-CM | POA: Diagnosis not present

## 2020-06-10 DIAGNOSIS — N2581 Secondary hyperparathyroidism of renal origin: Secondary | ICD-10-CM | POA: Diagnosis not present

## 2020-06-10 DIAGNOSIS — N186 End stage renal disease: Secondary | ICD-10-CM | POA: Diagnosis not present

## 2020-06-10 DIAGNOSIS — Z992 Dependence on renal dialysis: Secondary | ICD-10-CM | POA: Diagnosis not present

## 2020-06-10 DIAGNOSIS — D509 Iron deficiency anemia, unspecified: Secondary | ICD-10-CM | POA: Diagnosis not present

## 2020-06-11 DIAGNOSIS — N186 End stage renal disease: Secondary | ICD-10-CM | POA: Diagnosis not present

## 2020-06-11 DIAGNOSIS — N2581 Secondary hyperparathyroidism of renal origin: Secondary | ICD-10-CM | POA: Diagnosis not present

## 2020-06-11 DIAGNOSIS — Z992 Dependence on renal dialysis: Secondary | ICD-10-CM | POA: Diagnosis not present

## 2020-06-11 DIAGNOSIS — D509 Iron deficiency anemia, unspecified: Secondary | ICD-10-CM | POA: Diagnosis not present

## 2020-06-12 DIAGNOSIS — Z992 Dependence on renal dialysis: Secondary | ICD-10-CM | POA: Diagnosis not present

## 2020-06-12 DIAGNOSIS — N2581 Secondary hyperparathyroidism of renal origin: Secondary | ICD-10-CM | POA: Diagnosis not present

## 2020-06-12 DIAGNOSIS — N186 End stage renal disease: Secondary | ICD-10-CM | POA: Diagnosis not present

## 2020-06-12 DIAGNOSIS — D509 Iron deficiency anemia, unspecified: Secondary | ICD-10-CM | POA: Diagnosis not present

## 2020-06-13 DIAGNOSIS — D509 Iron deficiency anemia, unspecified: Secondary | ICD-10-CM | POA: Diagnosis not present

## 2020-06-13 DIAGNOSIS — N2581 Secondary hyperparathyroidism of renal origin: Secondary | ICD-10-CM | POA: Diagnosis not present

## 2020-06-13 DIAGNOSIS — N186 End stage renal disease: Secondary | ICD-10-CM | POA: Diagnosis not present

## 2020-06-13 DIAGNOSIS — Z992 Dependence on renal dialysis: Secondary | ICD-10-CM | POA: Diagnosis not present

## 2020-06-14 DIAGNOSIS — Z992 Dependence on renal dialysis: Secondary | ICD-10-CM | POA: Diagnosis not present

## 2020-06-14 DIAGNOSIS — D509 Iron deficiency anemia, unspecified: Secondary | ICD-10-CM | POA: Diagnosis not present

## 2020-06-14 DIAGNOSIS — N186 End stage renal disease: Secondary | ICD-10-CM | POA: Diagnosis not present

## 2020-06-14 DIAGNOSIS — N2581 Secondary hyperparathyroidism of renal origin: Secondary | ICD-10-CM | POA: Diagnosis not present

## 2020-06-16 DIAGNOSIS — D509 Iron deficiency anemia, unspecified: Secondary | ICD-10-CM | POA: Diagnosis not present

## 2020-06-16 DIAGNOSIS — Z992 Dependence on renal dialysis: Secondary | ICD-10-CM | POA: Diagnosis not present

## 2020-06-16 DIAGNOSIS — N186 End stage renal disease: Secondary | ICD-10-CM | POA: Diagnosis not present

## 2020-06-16 DIAGNOSIS — N2581 Secondary hyperparathyroidism of renal origin: Secondary | ICD-10-CM | POA: Diagnosis not present

## 2020-06-17 DIAGNOSIS — N2581 Secondary hyperparathyroidism of renal origin: Secondary | ICD-10-CM | POA: Diagnosis not present

## 2020-06-17 DIAGNOSIS — D509 Iron deficiency anemia, unspecified: Secondary | ICD-10-CM | POA: Diagnosis not present

## 2020-06-17 DIAGNOSIS — N186 End stage renal disease: Secondary | ICD-10-CM | POA: Diagnosis not present

## 2020-06-17 DIAGNOSIS — Z992 Dependence on renal dialysis: Secondary | ICD-10-CM | POA: Diagnosis not present

## 2020-06-18 DIAGNOSIS — N186 End stage renal disease: Secondary | ICD-10-CM | POA: Diagnosis not present

## 2020-06-18 DIAGNOSIS — N2581 Secondary hyperparathyroidism of renal origin: Secondary | ICD-10-CM | POA: Diagnosis not present

## 2020-06-18 DIAGNOSIS — Z992 Dependence on renal dialysis: Secondary | ICD-10-CM | POA: Diagnosis not present

## 2020-06-18 DIAGNOSIS — D509 Iron deficiency anemia, unspecified: Secondary | ICD-10-CM | POA: Diagnosis not present

## 2020-06-19 DIAGNOSIS — N186 End stage renal disease: Secondary | ICD-10-CM | POA: Diagnosis not present

## 2020-06-19 DIAGNOSIS — N2581 Secondary hyperparathyroidism of renal origin: Secondary | ICD-10-CM | POA: Diagnosis not present

## 2020-06-19 DIAGNOSIS — Z992 Dependence on renal dialysis: Secondary | ICD-10-CM | POA: Diagnosis not present

## 2020-06-19 DIAGNOSIS — E1122 Type 2 diabetes mellitus with diabetic chronic kidney disease: Secondary | ICD-10-CM | POA: Diagnosis not present

## 2020-06-19 DIAGNOSIS — E782 Mixed hyperlipidemia: Secondary | ICD-10-CM | POA: Diagnosis not present

## 2020-06-19 DIAGNOSIS — D509 Iron deficiency anemia, unspecified: Secondary | ICD-10-CM | POA: Diagnosis not present

## 2020-06-19 DIAGNOSIS — I120 Hypertensive chronic kidney disease with stage 5 chronic kidney disease or end stage renal disease: Secondary | ICD-10-CM | POA: Diagnosis not present

## 2020-06-20 DIAGNOSIS — N186 End stage renal disease: Secondary | ICD-10-CM | POA: Diagnosis not present

## 2020-06-20 DIAGNOSIS — D509 Iron deficiency anemia, unspecified: Secondary | ICD-10-CM | POA: Diagnosis not present

## 2020-06-20 DIAGNOSIS — Z992 Dependence on renal dialysis: Secondary | ICD-10-CM | POA: Diagnosis not present

## 2020-06-20 DIAGNOSIS — N2581 Secondary hyperparathyroidism of renal origin: Secondary | ICD-10-CM | POA: Diagnosis not present

## 2020-06-21 DIAGNOSIS — D509 Iron deficiency anemia, unspecified: Secondary | ICD-10-CM | POA: Diagnosis not present

## 2020-06-21 DIAGNOSIS — N2581 Secondary hyperparathyroidism of renal origin: Secondary | ICD-10-CM | POA: Diagnosis not present

## 2020-06-21 DIAGNOSIS — Z992 Dependence on renal dialysis: Secondary | ICD-10-CM | POA: Diagnosis not present

## 2020-06-21 DIAGNOSIS — N186 End stage renal disease: Secondary | ICD-10-CM | POA: Diagnosis not present

## 2020-06-23 DIAGNOSIS — N186 End stage renal disease: Secondary | ICD-10-CM | POA: Diagnosis not present

## 2020-06-23 DIAGNOSIS — N2581 Secondary hyperparathyroidism of renal origin: Secondary | ICD-10-CM | POA: Diagnosis not present

## 2020-06-23 DIAGNOSIS — D509 Iron deficiency anemia, unspecified: Secondary | ICD-10-CM | POA: Diagnosis not present

## 2020-06-23 DIAGNOSIS — Z992 Dependence on renal dialysis: Secondary | ICD-10-CM | POA: Diagnosis not present

## 2020-06-24 DIAGNOSIS — Z992 Dependence on renal dialysis: Secondary | ICD-10-CM | POA: Diagnosis not present

## 2020-06-24 DIAGNOSIS — D509 Iron deficiency anemia, unspecified: Secondary | ICD-10-CM | POA: Diagnosis not present

## 2020-06-24 DIAGNOSIS — N186 End stage renal disease: Secondary | ICD-10-CM | POA: Diagnosis not present

## 2020-06-25 DIAGNOSIS — Z992 Dependence on renal dialysis: Secondary | ICD-10-CM | POA: Diagnosis not present

## 2020-06-25 DIAGNOSIS — D509 Iron deficiency anemia, unspecified: Secondary | ICD-10-CM | POA: Diagnosis not present

## 2020-06-25 DIAGNOSIS — N186 End stage renal disease: Secondary | ICD-10-CM | POA: Diagnosis not present

## 2020-06-26 DIAGNOSIS — D509 Iron deficiency anemia, unspecified: Secondary | ICD-10-CM | POA: Diagnosis not present

## 2020-06-26 DIAGNOSIS — Z992 Dependence on renal dialysis: Secondary | ICD-10-CM | POA: Diagnosis not present

## 2020-06-26 DIAGNOSIS — N186 End stage renal disease: Secondary | ICD-10-CM | POA: Diagnosis not present

## 2020-06-27 DIAGNOSIS — D509 Iron deficiency anemia, unspecified: Secondary | ICD-10-CM | POA: Diagnosis not present

## 2020-06-27 DIAGNOSIS — Z992 Dependence on renal dialysis: Secondary | ICD-10-CM | POA: Diagnosis not present

## 2020-06-27 DIAGNOSIS — N186 End stage renal disease: Secondary | ICD-10-CM | POA: Diagnosis not present

## 2020-06-28 DIAGNOSIS — Z992 Dependence on renal dialysis: Secondary | ICD-10-CM | POA: Diagnosis not present

## 2020-06-28 DIAGNOSIS — D509 Iron deficiency anemia, unspecified: Secondary | ICD-10-CM | POA: Diagnosis not present

## 2020-06-28 DIAGNOSIS — N186 End stage renal disease: Secondary | ICD-10-CM | POA: Diagnosis not present

## 2020-06-30 DIAGNOSIS — D509 Iron deficiency anemia, unspecified: Secondary | ICD-10-CM | POA: Diagnosis not present

## 2020-06-30 DIAGNOSIS — N186 End stage renal disease: Secondary | ICD-10-CM | POA: Diagnosis not present

## 2020-06-30 DIAGNOSIS — Z992 Dependence on renal dialysis: Secondary | ICD-10-CM | POA: Diagnosis not present

## 2020-07-01 DIAGNOSIS — D509 Iron deficiency anemia, unspecified: Secondary | ICD-10-CM | POA: Diagnosis not present

## 2020-07-01 DIAGNOSIS — Z992 Dependence on renal dialysis: Secondary | ICD-10-CM | POA: Diagnosis not present

## 2020-07-01 DIAGNOSIS — N186 End stage renal disease: Secondary | ICD-10-CM | POA: Diagnosis not present

## 2020-07-02 DIAGNOSIS — D509 Iron deficiency anemia, unspecified: Secondary | ICD-10-CM | POA: Diagnosis not present

## 2020-07-02 DIAGNOSIS — N186 End stage renal disease: Secondary | ICD-10-CM | POA: Diagnosis not present

## 2020-07-02 DIAGNOSIS — Z992 Dependence on renal dialysis: Secondary | ICD-10-CM | POA: Diagnosis not present

## 2020-07-03 DIAGNOSIS — N186 End stage renal disease: Secondary | ICD-10-CM | POA: Diagnosis not present

## 2020-07-03 DIAGNOSIS — D509 Iron deficiency anemia, unspecified: Secondary | ICD-10-CM | POA: Diagnosis not present

## 2020-07-03 DIAGNOSIS — Z992 Dependence on renal dialysis: Secondary | ICD-10-CM | POA: Diagnosis not present

## 2020-07-04 DIAGNOSIS — N186 End stage renal disease: Secondary | ICD-10-CM | POA: Diagnosis not present

## 2020-07-04 DIAGNOSIS — Z992 Dependence on renal dialysis: Secondary | ICD-10-CM | POA: Diagnosis not present

## 2020-07-04 DIAGNOSIS — D509 Iron deficiency anemia, unspecified: Secondary | ICD-10-CM | POA: Diagnosis not present

## 2020-07-05 DIAGNOSIS — Z992 Dependence on renal dialysis: Secondary | ICD-10-CM | POA: Diagnosis not present

## 2020-07-05 DIAGNOSIS — N186 End stage renal disease: Secondary | ICD-10-CM | POA: Diagnosis not present

## 2020-07-05 DIAGNOSIS — D509 Iron deficiency anemia, unspecified: Secondary | ICD-10-CM | POA: Diagnosis not present

## 2020-07-07 DIAGNOSIS — Z992 Dependence on renal dialysis: Secondary | ICD-10-CM | POA: Diagnosis not present

## 2020-07-07 DIAGNOSIS — D509 Iron deficiency anemia, unspecified: Secondary | ICD-10-CM | POA: Diagnosis not present

## 2020-07-07 DIAGNOSIS — N186 End stage renal disease: Secondary | ICD-10-CM | POA: Diagnosis not present

## 2020-07-08 ENCOUNTER — Other Ambulatory Visit: Payer: Self-pay

## 2020-07-08 ENCOUNTER — Ambulatory Visit (INDEPENDENT_AMBULATORY_CARE_PROVIDER_SITE_OTHER): Payer: Medicare Other

## 2020-07-08 DIAGNOSIS — D509 Iron deficiency anemia, unspecified: Secondary | ICD-10-CM | POA: Diagnosis not present

## 2020-07-08 DIAGNOSIS — N186 End stage renal disease: Secondary | ICD-10-CM | POA: Diagnosis not present

## 2020-07-08 DIAGNOSIS — L570 Actinic keratosis: Secondary | ICD-10-CM | POA: Diagnosis not present

## 2020-07-08 DIAGNOSIS — Z992 Dependence on renal dialysis: Secondary | ICD-10-CM | POA: Diagnosis not present

## 2020-07-08 MED ORDER — AMINOLEVULINIC ACID HCL 20 % EX SOLR
1.0000 "application " | Freq: Once | CUTANEOUS | Status: AC
  Administered 2020-07-08: 354 mg via TOPICAL

## 2020-07-08 NOTE — Patient Instructions (Signed)

## 2020-07-08 NOTE — Progress Notes (Signed)
1. AK (actinic keratosis) °Scalp ° °Photodynamic therapy - Scalp °Procedure discussed: discussed risks, benefits, side effects. and alternatives   °Prep: site scrubbed/prepped with acetone   °Location:  Scalp °Number of lesions:  Multiple °Type of treatment:  Blue light °Aminolevulinic Acid (see MAR for details): Levulan °Number of Levulan sticks used:  1 °Incubation time (minutes):  120 °Number of minutes under lamp:  16 °Number of seconds under lamp:  40 °Cooling:  Floor fan °Outcome: patient tolerated procedure well with no complications   °Post-procedure details: sunscreen applied and aftercare instructions given to patient   ° °Aminolevulinic Acid HCl 20 % SOLR 354 mg - Scalp ° ° °  °

## 2020-07-09 DIAGNOSIS — N186 End stage renal disease: Secondary | ICD-10-CM | POA: Diagnosis not present

## 2020-07-09 DIAGNOSIS — D509 Iron deficiency anemia, unspecified: Secondary | ICD-10-CM | POA: Diagnosis not present

## 2020-07-09 DIAGNOSIS — Z992 Dependence on renal dialysis: Secondary | ICD-10-CM | POA: Diagnosis not present

## 2020-07-10 DIAGNOSIS — N186 End stage renal disease: Secondary | ICD-10-CM | POA: Diagnosis not present

## 2020-07-10 DIAGNOSIS — Z992 Dependence on renal dialysis: Secondary | ICD-10-CM | POA: Diagnosis not present

## 2020-07-10 DIAGNOSIS — D509 Iron deficiency anemia, unspecified: Secondary | ICD-10-CM | POA: Diagnosis not present

## 2020-07-11 DIAGNOSIS — N186 End stage renal disease: Secondary | ICD-10-CM | POA: Diagnosis not present

## 2020-07-11 DIAGNOSIS — Z992 Dependence on renal dialysis: Secondary | ICD-10-CM | POA: Diagnosis not present

## 2020-07-11 DIAGNOSIS — D509 Iron deficiency anemia, unspecified: Secondary | ICD-10-CM | POA: Diagnosis not present

## 2020-07-12 DIAGNOSIS — N186 End stage renal disease: Secondary | ICD-10-CM | POA: Diagnosis not present

## 2020-07-12 DIAGNOSIS — D509 Iron deficiency anemia, unspecified: Secondary | ICD-10-CM | POA: Diagnosis not present

## 2020-07-12 DIAGNOSIS — Z992 Dependence on renal dialysis: Secondary | ICD-10-CM | POA: Diagnosis not present

## 2020-07-14 DIAGNOSIS — N186 End stage renal disease: Secondary | ICD-10-CM | POA: Diagnosis not present

## 2020-07-14 DIAGNOSIS — D509 Iron deficiency anemia, unspecified: Secondary | ICD-10-CM | POA: Diagnosis not present

## 2020-07-14 DIAGNOSIS — Z992 Dependence on renal dialysis: Secondary | ICD-10-CM | POA: Diagnosis not present

## 2020-07-15 DIAGNOSIS — N186 End stage renal disease: Secondary | ICD-10-CM | POA: Diagnosis not present

## 2020-07-15 DIAGNOSIS — Z992 Dependence on renal dialysis: Secondary | ICD-10-CM | POA: Diagnosis not present

## 2020-07-15 DIAGNOSIS — D509 Iron deficiency anemia, unspecified: Secondary | ICD-10-CM | POA: Diagnosis not present

## 2020-07-16 DIAGNOSIS — Z992 Dependence on renal dialysis: Secondary | ICD-10-CM | POA: Diagnosis not present

## 2020-07-16 DIAGNOSIS — N186 End stage renal disease: Secondary | ICD-10-CM | POA: Diagnosis not present

## 2020-07-16 DIAGNOSIS — D509 Iron deficiency anemia, unspecified: Secondary | ICD-10-CM | POA: Diagnosis not present

## 2020-07-17 DIAGNOSIS — D509 Iron deficiency anemia, unspecified: Secondary | ICD-10-CM | POA: Diagnosis not present

## 2020-07-17 DIAGNOSIS — N186 End stage renal disease: Secondary | ICD-10-CM | POA: Diagnosis not present

## 2020-07-17 DIAGNOSIS — Z992 Dependence on renal dialysis: Secondary | ICD-10-CM | POA: Diagnosis not present

## 2020-07-18 DIAGNOSIS — D509 Iron deficiency anemia, unspecified: Secondary | ICD-10-CM | POA: Diagnosis not present

## 2020-07-18 DIAGNOSIS — Z992 Dependence on renal dialysis: Secondary | ICD-10-CM | POA: Diagnosis not present

## 2020-07-18 DIAGNOSIS — N186 End stage renal disease: Secondary | ICD-10-CM | POA: Diagnosis not present

## 2020-07-19 DIAGNOSIS — Z992 Dependence on renal dialysis: Secondary | ICD-10-CM | POA: Diagnosis not present

## 2020-07-19 DIAGNOSIS — N186 End stage renal disease: Secondary | ICD-10-CM | POA: Diagnosis not present

## 2020-07-19 DIAGNOSIS — D509 Iron deficiency anemia, unspecified: Secondary | ICD-10-CM | POA: Diagnosis not present

## 2020-07-21 DIAGNOSIS — D509 Iron deficiency anemia, unspecified: Secondary | ICD-10-CM | POA: Diagnosis not present

## 2020-07-21 DIAGNOSIS — N186 End stage renal disease: Secondary | ICD-10-CM | POA: Diagnosis not present

## 2020-07-21 DIAGNOSIS — E1122 Type 2 diabetes mellitus with diabetic chronic kidney disease: Secondary | ICD-10-CM | POA: Diagnosis not present

## 2020-07-21 DIAGNOSIS — E032 Hypothyroidism due to medicaments and other exogenous substances: Secondary | ICD-10-CM | POA: Diagnosis not present

## 2020-07-21 DIAGNOSIS — Z992 Dependence on renal dialysis: Secondary | ICD-10-CM | POA: Diagnosis not present

## 2020-07-22 DIAGNOSIS — I351 Nonrheumatic aortic (valve) insufficiency: Secondary | ICD-10-CM | POA: Diagnosis not present

## 2020-07-22 DIAGNOSIS — G4700 Insomnia, unspecified: Secondary | ICD-10-CM | POA: Diagnosis not present

## 2020-07-22 DIAGNOSIS — I10 Essential (primary) hypertension: Secondary | ICD-10-CM | POA: Diagnosis not present

## 2020-07-22 DIAGNOSIS — I2510 Atherosclerotic heart disease of native coronary artery without angina pectoris: Secondary | ICD-10-CM | POA: Diagnosis not present

## 2020-07-22 DIAGNOSIS — E785 Hyperlipidemia, unspecified: Secondary | ICD-10-CM | POA: Diagnosis not present

## 2020-07-22 DIAGNOSIS — Z992 Dependence on renal dialysis: Secondary | ICD-10-CM | POA: Diagnosis not present

## 2020-07-22 DIAGNOSIS — D509 Iron deficiency anemia, unspecified: Secondary | ICD-10-CM | POA: Diagnosis not present

## 2020-07-22 DIAGNOSIS — E032 Hypothyroidism due to medicaments and other exogenous substances: Secondary | ICD-10-CM | POA: Diagnosis not present

## 2020-07-22 DIAGNOSIS — D649 Anemia, unspecified: Secondary | ICD-10-CM | POA: Diagnosis not present

## 2020-07-22 DIAGNOSIS — I4891 Unspecified atrial fibrillation: Secondary | ICD-10-CM | POA: Diagnosis not present

## 2020-07-22 DIAGNOSIS — N186 End stage renal disease: Secondary | ICD-10-CM | POA: Diagnosis not present

## 2020-07-22 DIAGNOSIS — N2581 Secondary hyperparathyroidism of renal origin: Secondary | ICD-10-CM | POA: Diagnosis not present

## 2020-07-22 DIAGNOSIS — J309 Allergic rhinitis, unspecified: Secondary | ICD-10-CM | POA: Diagnosis not present

## 2020-07-22 DIAGNOSIS — L89159 Pressure ulcer of sacral region, unspecified stage: Secondary | ICD-10-CM | POA: Diagnosis not present

## 2020-07-22 DIAGNOSIS — E1122 Type 2 diabetes mellitus with diabetic chronic kidney disease: Secondary | ICD-10-CM | POA: Diagnosis not present

## 2020-07-23 DIAGNOSIS — N2581 Secondary hyperparathyroidism of renal origin: Secondary | ICD-10-CM | POA: Diagnosis not present

## 2020-07-23 DIAGNOSIS — D509 Iron deficiency anemia, unspecified: Secondary | ICD-10-CM | POA: Diagnosis not present

## 2020-07-23 DIAGNOSIS — Z992 Dependence on renal dialysis: Secondary | ICD-10-CM | POA: Diagnosis not present

## 2020-07-23 DIAGNOSIS — N186 End stage renal disease: Secondary | ICD-10-CM | POA: Diagnosis not present

## 2020-07-24 DIAGNOSIS — Z9861 Coronary angioplasty status: Secondary | ICD-10-CM | POA: Diagnosis not present

## 2020-07-24 DIAGNOSIS — I10 Essential (primary) hypertension: Secondary | ICD-10-CM | POA: Diagnosis not present

## 2020-07-24 DIAGNOSIS — R0602 Shortness of breath: Secondary | ICD-10-CM | POA: Diagnosis not present

## 2020-07-24 DIAGNOSIS — D509 Iron deficiency anemia, unspecified: Secondary | ICD-10-CM | POA: Diagnosis not present

## 2020-07-24 DIAGNOSIS — E663 Overweight: Secondary | ICD-10-CM | POA: Diagnosis not present

## 2020-07-24 DIAGNOSIS — I25810 Atherosclerosis of coronary artery bypass graft(s) without angina pectoris: Secondary | ICD-10-CM | POA: Diagnosis not present

## 2020-07-24 DIAGNOSIS — N186 End stage renal disease: Secondary | ICD-10-CM | POA: Diagnosis not present

## 2020-07-24 DIAGNOSIS — I2510 Atherosclerotic heart disease of native coronary artery without angina pectoris: Secondary | ICD-10-CM | POA: Diagnosis not present

## 2020-07-24 DIAGNOSIS — E785 Hyperlipidemia, unspecified: Secondary | ICD-10-CM | POA: Diagnosis not present

## 2020-07-24 DIAGNOSIS — Z992 Dependence on renal dialysis: Secondary | ICD-10-CM | POA: Diagnosis not present

## 2020-07-24 DIAGNOSIS — N2581 Secondary hyperparathyroidism of renal origin: Secondary | ICD-10-CM | POA: Diagnosis not present

## 2020-07-25 DIAGNOSIS — Z992 Dependence on renal dialysis: Secondary | ICD-10-CM | POA: Diagnosis not present

## 2020-07-25 DIAGNOSIS — D509 Iron deficiency anemia, unspecified: Secondary | ICD-10-CM | POA: Diagnosis not present

## 2020-07-25 DIAGNOSIS — N186 End stage renal disease: Secondary | ICD-10-CM | POA: Diagnosis not present

## 2020-07-25 DIAGNOSIS — N2581 Secondary hyperparathyroidism of renal origin: Secondary | ICD-10-CM | POA: Diagnosis not present

## 2020-07-26 DIAGNOSIS — Z992 Dependence on renal dialysis: Secondary | ICD-10-CM | POA: Diagnosis not present

## 2020-07-26 DIAGNOSIS — D509 Iron deficiency anemia, unspecified: Secondary | ICD-10-CM | POA: Diagnosis not present

## 2020-07-26 DIAGNOSIS — N2581 Secondary hyperparathyroidism of renal origin: Secondary | ICD-10-CM | POA: Diagnosis not present

## 2020-07-26 DIAGNOSIS — N186 End stage renal disease: Secondary | ICD-10-CM | POA: Diagnosis not present

## 2020-07-28 DIAGNOSIS — D509 Iron deficiency anemia, unspecified: Secondary | ICD-10-CM | POA: Diagnosis not present

## 2020-07-28 DIAGNOSIS — N186 End stage renal disease: Secondary | ICD-10-CM | POA: Diagnosis not present

## 2020-07-28 DIAGNOSIS — Z992 Dependence on renal dialysis: Secondary | ICD-10-CM | POA: Diagnosis not present

## 2020-07-28 DIAGNOSIS — N2581 Secondary hyperparathyroidism of renal origin: Secondary | ICD-10-CM | POA: Diagnosis not present

## 2020-07-29 DIAGNOSIS — N2581 Secondary hyperparathyroidism of renal origin: Secondary | ICD-10-CM | POA: Diagnosis not present

## 2020-07-29 DIAGNOSIS — Z992 Dependence on renal dialysis: Secondary | ICD-10-CM | POA: Diagnosis not present

## 2020-07-29 DIAGNOSIS — D509 Iron deficiency anemia, unspecified: Secondary | ICD-10-CM | POA: Diagnosis not present

## 2020-07-29 DIAGNOSIS — N186 End stage renal disease: Secondary | ICD-10-CM | POA: Diagnosis not present

## 2020-07-30 DIAGNOSIS — D509 Iron deficiency anemia, unspecified: Secondary | ICD-10-CM | POA: Diagnosis not present

## 2020-07-30 DIAGNOSIS — N186 End stage renal disease: Secondary | ICD-10-CM | POA: Diagnosis not present

## 2020-07-30 DIAGNOSIS — Z992 Dependence on renal dialysis: Secondary | ICD-10-CM | POA: Diagnosis not present

## 2020-07-30 DIAGNOSIS — N2581 Secondary hyperparathyroidism of renal origin: Secondary | ICD-10-CM | POA: Diagnosis not present

## 2020-07-31 DIAGNOSIS — Z992 Dependence on renal dialysis: Secondary | ICD-10-CM | POA: Diagnosis not present

## 2020-07-31 DIAGNOSIS — N186 End stage renal disease: Secondary | ICD-10-CM | POA: Diagnosis not present

## 2020-07-31 DIAGNOSIS — N2581 Secondary hyperparathyroidism of renal origin: Secondary | ICD-10-CM | POA: Diagnosis not present

## 2020-07-31 DIAGNOSIS — D509 Iron deficiency anemia, unspecified: Secondary | ICD-10-CM | POA: Diagnosis not present

## 2020-08-01 DIAGNOSIS — Z992 Dependence on renal dialysis: Secondary | ICD-10-CM | POA: Diagnosis not present

## 2020-08-01 DIAGNOSIS — N2581 Secondary hyperparathyroidism of renal origin: Secondary | ICD-10-CM | POA: Diagnosis not present

## 2020-08-01 DIAGNOSIS — N186 End stage renal disease: Secondary | ICD-10-CM | POA: Diagnosis not present

## 2020-08-01 DIAGNOSIS — D509 Iron deficiency anemia, unspecified: Secondary | ICD-10-CM | POA: Diagnosis not present

## 2020-08-02 DIAGNOSIS — N186 End stage renal disease: Secondary | ICD-10-CM | POA: Diagnosis not present

## 2020-08-02 DIAGNOSIS — N2581 Secondary hyperparathyroidism of renal origin: Secondary | ICD-10-CM | POA: Diagnosis not present

## 2020-08-02 DIAGNOSIS — D509 Iron deficiency anemia, unspecified: Secondary | ICD-10-CM | POA: Diagnosis not present

## 2020-08-02 DIAGNOSIS — Z992 Dependence on renal dialysis: Secondary | ICD-10-CM | POA: Diagnosis not present

## 2020-08-04 DIAGNOSIS — N186 End stage renal disease: Secondary | ICD-10-CM | POA: Diagnosis not present

## 2020-08-04 DIAGNOSIS — N2581 Secondary hyperparathyroidism of renal origin: Secondary | ICD-10-CM | POA: Diagnosis not present

## 2020-08-04 DIAGNOSIS — D509 Iron deficiency anemia, unspecified: Secondary | ICD-10-CM | POA: Diagnosis not present

## 2020-08-04 DIAGNOSIS — Z992 Dependence on renal dialysis: Secondary | ICD-10-CM | POA: Diagnosis not present

## 2020-08-05 DIAGNOSIS — D509 Iron deficiency anemia, unspecified: Secondary | ICD-10-CM | POA: Diagnosis not present

## 2020-08-05 DIAGNOSIS — N2581 Secondary hyperparathyroidism of renal origin: Secondary | ICD-10-CM | POA: Diagnosis not present

## 2020-08-05 DIAGNOSIS — N186 End stage renal disease: Secondary | ICD-10-CM | POA: Diagnosis not present

## 2020-08-05 DIAGNOSIS — Z992 Dependence on renal dialysis: Secondary | ICD-10-CM | POA: Diagnosis not present

## 2020-08-06 DIAGNOSIS — D509 Iron deficiency anemia, unspecified: Secondary | ICD-10-CM | POA: Diagnosis not present

## 2020-08-06 DIAGNOSIS — N186 End stage renal disease: Secondary | ICD-10-CM | POA: Diagnosis not present

## 2020-08-06 DIAGNOSIS — Z992 Dependence on renal dialysis: Secondary | ICD-10-CM | POA: Diagnosis not present

## 2020-08-06 DIAGNOSIS — N2581 Secondary hyperparathyroidism of renal origin: Secondary | ICD-10-CM | POA: Diagnosis not present

## 2020-08-07 ENCOUNTER — Ambulatory Visit (INDEPENDENT_AMBULATORY_CARE_PROVIDER_SITE_OTHER): Payer: Medicare Other

## 2020-08-07 ENCOUNTER — Other Ambulatory Visit: Payer: Self-pay

## 2020-08-07 DIAGNOSIS — D509 Iron deficiency anemia, unspecified: Secondary | ICD-10-CM | POA: Diagnosis not present

## 2020-08-07 DIAGNOSIS — L570 Actinic keratosis: Secondary | ICD-10-CM

## 2020-08-07 DIAGNOSIS — Z992 Dependence on renal dialysis: Secondary | ICD-10-CM | POA: Diagnosis not present

## 2020-08-07 DIAGNOSIS — N2581 Secondary hyperparathyroidism of renal origin: Secondary | ICD-10-CM | POA: Diagnosis not present

## 2020-08-07 DIAGNOSIS — N186 End stage renal disease: Secondary | ICD-10-CM | POA: Diagnosis not present

## 2020-08-07 MED ORDER — AMINOLEVULINIC ACID HCL 20 % EX SOLR
1.0000 "application " | Freq: Once | CUTANEOUS | Status: AC
  Administered 2020-08-07: 354 mg via TOPICAL

## 2020-08-07 NOTE — Patient Instructions (Signed)

## 2020-08-07 NOTE — Progress Notes (Signed)
1. AK (actinic keratosis) °Scalp ° °Photodynamic therapy - Scalp °Procedure discussed: discussed risks, benefits, side effects. and alternatives   °Prep: site scrubbed/prepped with acetone   °Location:  Scalp °Number of lesions:  Multiple °Type of treatment:  Blue light °Aminolevulinic Acid (see MAR for details): Levulan °Number of Levulan sticks used:  1 °Incubation time (minutes):  120 °Number of minutes under lamp:  16 °Number of seconds under lamp:  40 °Cooling:  Floor fan °Outcome: patient tolerated procedure well with no complications   °Post-procedure details: sunscreen applied and aftercare instructions given to patient   ° °Aminolevulinic Acid HCl 20 % SOLR 354 mg - Scalp ° ° °  °

## 2020-08-08 ENCOUNTER — Ambulatory Visit: Payer: Self-pay | Admitting: *Deleted

## 2020-08-08 DIAGNOSIS — D509 Iron deficiency anemia, unspecified: Secondary | ICD-10-CM | POA: Diagnosis not present

## 2020-08-08 DIAGNOSIS — Z992 Dependence on renal dialysis: Secondary | ICD-10-CM | POA: Diagnosis not present

## 2020-08-08 DIAGNOSIS — N186 End stage renal disease: Secondary | ICD-10-CM | POA: Diagnosis not present

## 2020-08-08 DIAGNOSIS — N2581 Secondary hyperparathyroidism of renal origin: Secondary | ICD-10-CM | POA: Diagnosis not present

## 2020-08-08 NOTE — Telephone Encounter (Signed)
Patient 's daughter called to report patient tested positive for covid today with at home government test. Patient's other daughter tested positive and symptoms since Tuesday Patient does not have any symptoms at this time but is diabetic, PD, CHF. Patient's daughter instructed to contact patient's PCP for referral to [facility] infusion clinic. Patient reports PCP office is closed and does not take call. Reviewed isolation precautions for covid. Criteria for self-isolation if you test positive for COVID-19, regardless of vaccination status:  -If you have mild symptoms that are resolving or have resolved, isolate at home for 5 days since symptoms started AND continue to wear a well-fitted mask when around others in the home and in public for 5 additional days after isolation is completed -If you have a fever and/or moderate to severe symptoms, isolate for at least 10 days since the symptoms started AND until you are fever free for at least 24 hours without the use of fever-reducing medications -If you tested positive and did not have symptoms, isolate for at least 5 days after your positive test  Use over-the-counter medications for symptoms.If you develop respiratory issues/distress, seek medical care in the Emergency Department.  If you must leave home or if you have to be around others please wear a mask. Please limit contact with immediate family members in the home, practice social distancing, frequent handwashing and clean hard surfaces touched frequently with household cleaning products. Members of your household will also need to quarantine and test. Instructed patient's daughter if patient develops symptoms to go to ED over the weekend and to contact PCP Monday for referral for [facility] infusion. Patient's daughter verbalized understanding of care advise and to call back or go to ED if symptoms noted.   Reason for Disposition . HIGH RISK for severe COVID complications (e.g., weak immune system, age > 57  years, obesity with BMI > 25, pregnant, chronic lung disease or other chronic medical condition)  (Exception: Already seen by PCP and no new or worsening symptoms.)  Answer Assessment - Initial Assessment Questions 1. COVID-19 DIAGNOSIS: "Who made your COVID-19 diagnosis?" "Was it confirmed by a positive lab test or self-test?" If not diagnosed by a doctor (or NP/PA), ask "Are there lots of cases (community spread) where you live?" Note: See public health department website, if unsure.     Government test  2. COVID-19 EXPOSURE: "Was there any known exposure to COVID before the symptoms began?" CDC Definition of close contact: within 6 feet (2 meters) for a total of 15 minutes or more over a 24-hour period.      Daughter  3. ONSET: "When did the COVID-19 symptoms start?"      No symptom 4. WORST SYMPTOM: "What is your worst symptom?" (e.g., cough, fever, shortness of breath, muscle aches)     None  5. COUGH: "Do you have a cough?" If Yes, ask: "How bad is the cough?"       na 6. FEVER: "Do you have a fever?" If Yes, ask: "What is your temperature, how was it measured, and when did it start?"     na 7. RESPIRATORY STATUS: "Describe your breathing?" (e.g., shortness of breath, wheezing, unable to speak)      Na  8. BETTER-SAME-WORSE: "Are you getting better, staying the same or getting worse compared to yesterday?"  If getting worse, ask, "In what way?"     na 9. HIGH RISK DISEASE: "Do you have any chronic medical problems?" (e.g., asthma, heart or lung disease, weak immune  system, obesity, etc.)     CHF, PD , diabetic  10. VACCINE: "Have you had the COVID-19 vaccine?" If Yes, ask: "Which one, how many shots, when did you get it?"       Yes pfizer 11. BOOSTER: "Have you received your COVID-19 booster?" If Yes, ask: "Which one and when did you get it?"       Yes Pfizer Oct. 2021 12. PREGNANCY: "Is there any chance you are pregnant?" "When was your last menstrual period?"       na 13. OTHER  SYMPTOMS: "Do you have any other symptoms?"  (e.g., chills, fatigue, headache, loss of smell or taste, muscle pain, sore throat)       none 14. O2 SATURATION MONITOR:  "Do you use an oxygen saturation monitor (pulse oximeter) at home?" If Yes, ask "What is your reading (oxygen level) today?" "What is your usual oxygen saturation reading?" (e.g., 95%)       na  Protocols used: CORONAVIRUS (COVID-19) DIAGNOSED OR SUSPECTED-A-AH

## 2020-08-09 DIAGNOSIS — Z992 Dependence on renal dialysis: Secondary | ICD-10-CM | POA: Diagnosis not present

## 2020-08-09 DIAGNOSIS — N186 End stage renal disease: Secondary | ICD-10-CM | POA: Diagnosis not present

## 2020-08-09 DIAGNOSIS — D509 Iron deficiency anemia, unspecified: Secondary | ICD-10-CM | POA: Diagnosis not present

## 2020-08-09 DIAGNOSIS — N2581 Secondary hyperparathyroidism of renal origin: Secondary | ICD-10-CM | POA: Diagnosis not present

## 2020-08-11 DIAGNOSIS — N2581 Secondary hyperparathyroidism of renal origin: Secondary | ICD-10-CM | POA: Diagnosis not present

## 2020-08-11 DIAGNOSIS — N186 End stage renal disease: Secondary | ICD-10-CM | POA: Diagnosis not present

## 2020-08-11 DIAGNOSIS — D509 Iron deficiency anemia, unspecified: Secondary | ICD-10-CM | POA: Diagnosis not present

## 2020-08-11 DIAGNOSIS — Z992 Dependence on renal dialysis: Secondary | ICD-10-CM | POA: Diagnosis not present

## 2020-08-12 ENCOUNTER — Telehealth: Payer: Self-pay | Admitting: Adult Health

## 2020-08-12 DIAGNOSIS — N186 End stage renal disease: Secondary | ICD-10-CM | POA: Diagnosis not present

## 2020-08-12 DIAGNOSIS — Z992 Dependence on renal dialysis: Secondary | ICD-10-CM | POA: Diagnosis not present

## 2020-08-12 DIAGNOSIS — D509 Iron deficiency anemia, unspecified: Secondary | ICD-10-CM | POA: Diagnosis not present

## 2020-08-12 DIAGNOSIS — N2581 Secondary hyperparathyroidism of renal origin: Secondary | ICD-10-CM | POA: Diagnosis not present

## 2020-08-12 NOTE — Telephone Encounter (Signed)
Called to discuss with patient about COVID-19 symptoms and the use of one of the available treatments for those with mild to moderate Covid symptoms and at a high risk of hospitalization.  Pt appears to qualify for outpatient treatment due to co-morbid conditions and/or a member of an at-risk group in accordance with the FDA Emergency Use Authorization.    Unable to reach pt - LMOM for him to return my call   Scot Dock

## 2020-08-13 ENCOUNTER — Telehealth: Payer: Self-pay

## 2020-08-13 DIAGNOSIS — N2581 Secondary hyperparathyroidism of renal origin: Secondary | ICD-10-CM | POA: Diagnosis not present

## 2020-08-13 DIAGNOSIS — Z992 Dependence on renal dialysis: Secondary | ICD-10-CM | POA: Diagnosis not present

## 2020-08-13 DIAGNOSIS — N186 End stage renal disease: Secondary | ICD-10-CM | POA: Diagnosis not present

## 2020-08-13 DIAGNOSIS — D509 Iron deficiency anemia, unspecified: Secondary | ICD-10-CM | POA: Diagnosis not present

## 2020-08-13 NOTE — Telephone Encounter (Signed)
Called to discuss with patient about COVID-19 symptoms and the use of one of the available treatments for those with mild to moderate Covid symptoms and at a high risk of hospitalization.  Pt appears to qualify for outpatient treatment due to co-morbid conditions and/or a member of an at-risk group in accordance with the FDA Emergency Use Authorization.      Unable to reach pt - Left message and call back number [phone].  Matthew [name]

## 2020-08-14 DIAGNOSIS — Z992 Dependence on renal dialysis: Secondary | ICD-10-CM | POA: Diagnosis not present

## 2020-08-14 DIAGNOSIS — N2581 Secondary hyperparathyroidism of renal origin: Secondary | ICD-10-CM | POA: Diagnosis not present

## 2020-08-14 DIAGNOSIS — D509 Iron deficiency anemia, unspecified: Secondary | ICD-10-CM | POA: Diagnosis not present

## 2020-08-14 DIAGNOSIS — N186 End stage renal disease: Secondary | ICD-10-CM | POA: Diagnosis not present

## 2020-08-15 DIAGNOSIS — D509 Iron deficiency anemia, unspecified: Secondary | ICD-10-CM | POA: Diagnosis not present

## 2020-08-15 DIAGNOSIS — Z992 Dependence on renal dialysis: Secondary | ICD-10-CM | POA: Diagnosis not present

## 2020-08-15 DIAGNOSIS — N186 End stage renal disease: Secondary | ICD-10-CM | POA: Diagnosis not present

## 2020-08-15 DIAGNOSIS — N2581 Secondary hyperparathyroidism of renal origin: Secondary | ICD-10-CM | POA: Diagnosis not present

## 2020-08-16 DIAGNOSIS — N2581 Secondary hyperparathyroidism of renal origin: Secondary | ICD-10-CM | POA: Diagnosis not present

## 2020-08-16 DIAGNOSIS — N186 End stage renal disease: Secondary | ICD-10-CM | POA: Diagnosis not present

## 2020-08-16 DIAGNOSIS — D509 Iron deficiency anemia, unspecified: Secondary | ICD-10-CM | POA: Diagnosis not present

## 2020-08-16 DIAGNOSIS — Z992 Dependence on renal dialysis: Secondary | ICD-10-CM | POA: Diagnosis not present

## 2020-08-18 ENCOUNTER — Other Ambulatory Visit: Payer: Self-pay | Admitting: Nephrology

## 2020-08-18 ENCOUNTER — Ambulatory Visit
Admission: RE | Admit: 2020-08-18 | Discharge: 2020-08-18 | Disposition: A | Discharge status: Home / Self Care | Payer: Medicare Other | Source: Ambulatory Visit | Attending: Nephrology | Admitting: Nephrology

## 2020-08-18 ENCOUNTER — Other Ambulatory Visit: Payer: Self-pay

## 2020-08-18 DIAGNOSIS — N2581 Secondary hyperparathyroidism of renal origin: Secondary | ICD-10-CM | POA: Diagnosis not present

## 2020-08-18 DIAGNOSIS — T85611A Breakdown (mechanical) of intraperitoneal dialysis catheter, initial encounter: Secondary | ICD-10-CM | POA: Insufficient documentation

## 2020-08-18 DIAGNOSIS — N186 End stage renal disease: Secondary | ICD-10-CM | POA: Diagnosis not present

## 2020-08-18 DIAGNOSIS — Z452 Encounter for adjustment and management of vascular access device: Secondary | ICD-10-CM | POA: Diagnosis not present

## 2020-08-18 DIAGNOSIS — Z992 Dependence on renal dialysis: Secondary | ICD-10-CM | POA: Diagnosis not present

## 2020-08-18 DIAGNOSIS — Z4902 Encounter for fitting and adjustment of peritoneal dialysis catheter: Secondary | ICD-10-CM | POA: Diagnosis not present

## 2020-08-18 DIAGNOSIS — D509 Iron deficiency anemia, unspecified: Secondary | ICD-10-CM | POA: Diagnosis not present

## 2020-08-19 DIAGNOSIS — N2581 Secondary hyperparathyroidism of renal origin: Secondary | ICD-10-CM | POA: Diagnosis not present

## 2020-08-19 DIAGNOSIS — Z992 Dependence on renal dialysis: Secondary | ICD-10-CM | POA: Diagnosis not present

## 2020-08-19 DIAGNOSIS — D509 Iron deficiency anemia, unspecified: Secondary | ICD-10-CM | POA: Diagnosis not present

## 2020-08-19 DIAGNOSIS — N186 End stage renal disease: Secondary | ICD-10-CM | POA: Diagnosis not present

## 2020-08-20 DIAGNOSIS — D509 Iron deficiency anemia, unspecified: Secondary | ICD-10-CM | POA: Diagnosis not present

## 2020-08-20 DIAGNOSIS — I132 Hypertensive heart and chronic kidney disease with heart failure and with stage 5 chronic kidney disease, or end stage renal disease: Secondary | ICD-10-CM | POA: Diagnosis not present

## 2020-08-20 DIAGNOSIS — N2581 Secondary hyperparathyroidism of renal origin: Secondary | ICD-10-CM | POA: Diagnosis not present

## 2020-08-20 DIAGNOSIS — I712 Thoracic aortic aneurysm, without rupture: Secondary | ICD-10-CM | POA: Diagnosis not present

## 2020-08-20 DIAGNOSIS — Z992 Dependence on renal dialysis: Secondary | ICD-10-CM | POA: Diagnosis not present

## 2020-08-20 DIAGNOSIS — N186 End stage renal disease: Secondary | ICD-10-CM | POA: Diagnosis not present

## 2020-08-20 DIAGNOSIS — I2510 Atherosclerotic heart disease of native coronary artery without angina pectoris: Secondary | ICD-10-CM | POA: Diagnosis not present

## 2020-08-20 DIAGNOSIS — I4891 Unspecified atrial fibrillation: Secondary | ICD-10-CM | POA: Diagnosis not present

## 2020-08-20 DIAGNOSIS — E119 Type 2 diabetes mellitus without complications: Secondary | ICD-10-CM | POA: Diagnosis not present

## 2020-08-20 DIAGNOSIS — N185 Chronic kidney disease, stage 5: Secondary | ICD-10-CM | POA: Diagnosis not present

## 2020-08-20 DIAGNOSIS — I509 Heart failure, unspecified: Secondary | ICD-10-CM | POA: Diagnosis not present

## 2020-08-21 DIAGNOSIS — N2581 Secondary hyperparathyroidism of renal origin: Secondary | ICD-10-CM | POA: Diagnosis not present

## 2020-08-21 DIAGNOSIS — D509 Iron deficiency anemia, unspecified: Secondary | ICD-10-CM | POA: Diagnosis not present

## 2020-08-21 DIAGNOSIS — N186 End stage renal disease: Secondary | ICD-10-CM | POA: Diagnosis not present

## 2020-08-21 DIAGNOSIS — Z992 Dependence on renal dialysis: Secondary | ICD-10-CM | POA: Diagnosis not present

## 2020-08-22 DIAGNOSIS — N2581 Secondary hyperparathyroidism of renal origin: Secondary | ICD-10-CM | POA: Diagnosis not present

## 2020-08-22 DIAGNOSIS — Z992 Dependence on renal dialysis: Secondary | ICD-10-CM | POA: Diagnosis not present

## 2020-08-22 DIAGNOSIS — N186 End stage renal disease: Secondary | ICD-10-CM | POA: Diagnosis not present

## 2020-08-22 DIAGNOSIS — D509 Iron deficiency anemia, unspecified: Secondary | ICD-10-CM | POA: Diagnosis not present

## 2020-08-23 DIAGNOSIS — N186 End stage renal disease: Secondary | ICD-10-CM | POA: Diagnosis not present

## 2020-08-23 DIAGNOSIS — N2581 Secondary hyperparathyroidism of renal origin: Secondary | ICD-10-CM | POA: Diagnosis not present

## 2020-08-23 DIAGNOSIS — D509 Iron deficiency anemia, unspecified: Secondary | ICD-10-CM | POA: Diagnosis not present

## 2020-08-23 DIAGNOSIS — Z992 Dependence on renal dialysis: Secondary | ICD-10-CM | POA: Diagnosis not present

## 2020-08-25 DIAGNOSIS — N2581 Secondary hyperparathyroidism of renal origin: Secondary | ICD-10-CM | POA: Diagnosis not present

## 2020-08-25 DIAGNOSIS — D509 Iron deficiency anemia, unspecified: Secondary | ICD-10-CM | POA: Diagnosis not present

## 2020-08-25 DIAGNOSIS — Z951 Presence of aortocoronary bypass graft: Secondary | ICD-10-CM | POA: Diagnosis not present

## 2020-08-25 DIAGNOSIS — I712 Thoracic aortic aneurysm, without rupture: Secondary | ICD-10-CM | POA: Diagnosis not present

## 2020-08-25 DIAGNOSIS — T85898A Other specified complication of other internal prosthetic devices, implants and grafts, initial encounter: Secondary | ICD-10-CM | POA: Diagnosis not present

## 2020-08-25 DIAGNOSIS — Z87891 Personal history of nicotine dependence: Secondary | ICD-10-CM | POA: Diagnosis not present

## 2020-08-25 DIAGNOSIS — I2510 Atherosclerotic heart disease of native coronary artery without angina pectoris: Secondary | ICD-10-CM | POA: Diagnosis not present

## 2020-08-25 DIAGNOSIS — I132 Hypertensive heart and chronic kidney disease with heart failure and with stage 5 chronic kidney disease, or end stage renal disease: Secondary | ICD-10-CM | POA: Diagnosis not present

## 2020-08-25 DIAGNOSIS — Z955 Presence of coronary angioplasty implant and graft: Secondary | ICD-10-CM | POA: Diagnosis not present

## 2020-08-25 DIAGNOSIS — I4891 Unspecified atrial fibrillation: Secondary | ICD-10-CM | POA: Diagnosis not present

## 2020-08-25 DIAGNOSIS — Z7902 Long term (current) use of antithrombotics/antiplatelets: Secondary | ICD-10-CM | POA: Diagnosis not present

## 2020-08-25 DIAGNOSIS — E1122 Type 2 diabetes mellitus with diabetic chronic kidney disease: Secondary | ICD-10-CM | POA: Diagnosis not present

## 2020-08-25 DIAGNOSIS — I509 Heart failure, unspecified: Secondary | ICD-10-CM | POA: Diagnosis not present

## 2020-08-25 DIAGNOSIS — T85611A Breakdown (mechanical) of intraperitoneal dialysis catheter, initial encounter: Secondary | ICD-10-CM | POA: Diagnosis not present

## 2020-08-25 DIAGNOSIS — Z992 Dependence on renal dialysis: Secondary | ICD-10-CM | POA: Diagnosis not present

## 2020-08-25 DIAGNOSIS — N186 End stage renal disease: Secondary | ICD-10-CM | POA: Diagnosis not present

## 2020-08-26 DIAGNOSIS — Z992 Dependence on renal dialysis: Secondary | ICD-10-CM | POA: Diagnosis not present

## 2020-08-26 DIAGNOSIS — N186 End stage renal disease: Secondary | ICD-10-CM | POA: Diagnosis not present

## 2020-08-26 DIAGNOSIS — D509 Iron deficiency anemia, unspecified: Secondary | ICD-10-CM | POA: Diagnosis not present

## 2020-08-26 DIAGNOSIS — N2581 Secondary hyperparathyroidism of renal origin: Secondary | ICD-10-CM | POA: Diagnosis not present

## 2020-08-27 DIAGNOSIS — N186 End stage renal disease: Secondary | ICD-10-CM | POA: Diagnosis not present

## 2020-08-27 DIAGNOSIS — N2581 Secondary hyperparathyroidism of renal origin: Secondary | ICD-10-CM | POA: Diagnosis not present

## 2020-08-27 DIAGNOSIS — Z992 Dependence on renal dialysis: Secondary | ICD-10-CM | POA: Diagnosis not present

## 2020-08-27 DIAGNOSIS — D509 Iron deficiency anemia, unspecified: Secondary | ICD-10-CM | POA: Diagnosis not present

## 2020-08-28 DIAGNOSIS — D509 Iron deficiency anemia, unspecified: Secondary | ICD-10-CM | POA: Diagnosis not present

## 2020-08-28 DIAGNOSIS — N2581 Secondary hyperparathyroidism of renal origin: Secondary | ICD-10-CM | POA: Diagnosis not present

## 2020-08-28 DIAGNOSIS — Z992 Dependence on renal dialysis: Secondary | ICD-10-CM | POA: Diagnosis not present

## 2020-08-28 DIAGNOSIS — N186 End stage renal disease: Secondary | ICD-10-CM | POA: Diagnosis not present

## 2020-08-29 DIAGNOSIS — N186 End stage renal disease: Secondary | ICD-10-CM | POA: Diagnosis not present

## 2020-08-29 DIAGNOSIS — D509 Iron deficiency anemia, unspecified: Secondary | ICD-10-CM | POA: Diagnosis not present

## 2020-08-29 DIAGNOSIS — Z992 Dependence on renal dialysis: Secondary | ICD-10-CM | POA: Diagnosis not present

## 2020-08-29 DIAGNOSIS — N2581 Secondary hyperparathyroidism of renal origin: Secondary | ICD-10-CM | POA: Diagnosis not present

## 2020-08-30 DIAGNOSIS — N186 End stage renal disease: Secondary | ICD-10-CM | POA: Diagnosis not present

## 2020-08-30 DIAGNOSIS — Z992 Dependence on renal dialysis: Secondary | ICD-10-CM | POA: Diagnosis not present

## 2020-08-30 DIAGNOSIS — D509 Iron deficiency anemia, unspecified: Secondary | ICD-10-CM | POA: Diagnosis not present

## 2020-08-30 DIAGNOSIS — N2581 Secondary hyperparathyroidism of renal origin: Secondary | ICD-10-CM | POA: Diagnosis not present

## 2020-09-01 DIAGNOSIS — D509 Iron deficiency anemia, unspecified: Secondary | ICD-10-CM | POA: Diagnosis not present

## 2020-09-01 DIAGNOSIS — Z992 Dependence on renal dialysis: Secondary | ICD-10-CM | POA: Diagnosis not present

## 2020-09-01 DIAGNOSIS — N2581 Secondary hyperparathyroidism of renal origin: Secondary | ICD-10-CM | POA: Diagnosis not present

## 2020-09-01 DIAGNOSIS — N186 End stage renal disease: Secondary | ICD-10-CM | POA: Diagnosis not present

## 2020-09-02 DIAGNOSIS — Z23 Encounter for immunization: Secondary | ICD-10-CM | POA: Diagnosis not present

## 2020-09-02 DIAGNOSIS — E119 Type 2 diabetes mellitus without complications: Secondary | ICD-10-CM | POA: Diagnosis not present

## 2020-09-02 DIAGNOSIS — Z992 Dependence on renal dialysis: Secondary | ICD-10-CM | POA: Diagnosis not present

## 2020-09-02 DIAGNOSIS — D509 Iron deficiency anemia, unspecified: Secondary | ICD-10-CM | POA: Diagnosis not present

## 2020-09-02 DIAGNOSIS — N2581 Secondary hyperparathyroidism of renal origin: Secondary | ICD-10-CM | POA: Diagnosis not present

## 2020-09-02 DIAGNOSIS — N186 End stage renal disease: Secondary | ICD-10-CM | POA: Diagnosis not present

## 2020-09-03 DIAGNOSIS — D509 Iron deficiency anemia, unspecified: Secondary | ICD-10-CM | POA: Diagnosis not present

## 2020-09-03 DIAGNOSIS — N2581 Secondary hyperparathyroidism of renal origin: Secondary | ICD-10-CM | POA: Diagnosis not present

## 2020-09-03 DIAGNOSIS — N186 End stage renal disease: Secondary | ICD-10-CM | POA: Diagnosis not present

## 2020-09-03 DIAGNOSIS — Z992 Dependence on renal dialysis: Secondary | ICD-10-CM | POA: Diagnosis not present

## 2020-09-04 DIAGNOSIS — Z992 Dependence on renal dialysis: Secondary | ICD-10-CM | POA: Diagnosis not present

## 2020-09-04 DIAGNOSIS — N186 End stage renal disease: Secondary | ICD-10-CM | POA: Diagnosis not present

## 2020-09-04 DIAGNOSIS — N2581 Secondary hyperparathyroidism of renal origin: Secondary | ICD-10-CM | POA: Diagnosis not present

## 2020-09-04 DIAGNOSIS — D509 Iron deficiency anemia, unspecified: Secondary | ICD-10-CM | POA: Diagnosis not present

## 2020-09-05 DIAGNOSIS — Z992 Dependence on renal dialysis: Secondary | ICD-10-CM | POA: Diagnosis not present

## 2020-09-05 DIAGNOSIS — D509 Iron deficiency anemia, unspecified: Secondary | ICD-10-CM | POA: Diagnosis not present

## 2020-09-05 DIAGNOSIS — N2581 Secondary hyperparathyroidism of renal origin: Secondary | ICD-10-CM | POA: Diagnosis not present

## 2020-09-05 DIAGNOSIS — N186 End stage renal disease: Secondary | ICD-10-CM | POA: Diagnosis not present

## 2020-09-06 DIAGNOSIS — D509 Iron deficiency anemia, unspecified: Secondary | ICD-10-CM | POA: Diagnosis not present

## 2020-09-06 DIAGNOSIS — N186 End stage renal disease: Secondary | ICD-10-CM | POA: Diagnosis not present

## 2020-09-06 DIAGNOSIS — N2581 Secondary hyperparathyroidism of renal origin: Secondary | ICD-10-CM | POA: Diagnosis not present

## 2020-09-06 DIAGNOSIS — Z992 Dependence on renal dialysis: Secondary | ICD-10-CM | POA: Diagnosis not present

## 2020-09-08 DIAGNOSIS — D509 Iron deficiency anemia, unspecified: Secondary | ICD-10-CM | POA: Diagnosis not present

## 2020-09-08 DIAGNOSIS — N2581 Secondary hyperparathyroidism of renal origin: Secondary | ICD-10-CM | POA: Diagnosis not present

## 2020-09-08 DIAGNOSIS — N186 End stage renal disease: Secondary | ICD-10-CM | POA: Diagnosis not present

## 2020-09-08 DIAGNOSIS — Z992 Dependence on renal dialysis: Secondary | ICD-10-CM | POA: Diagnosis not present

## 2020-09-09 DIAGNOSIS — N2581 Secondary hyperparathyroidism of renal origin: Secondary | ICD-10-CM | POA: Diagnosis not present

## 2020-09-09 DIAGNOSIS — D509 Iron deficiency anemia, unspecified: Secondary | ICD-10-CM | POA: Diagnosis not present

## 2020-09-09 DIAGNOSIS — Z992 Dependence on renal dialysis: Secondary | ICD-10-CM | POA: Diagnosis not present

## 2020-09-09 DIAGNOSIS — N186 End stage renal disease: Secondary | ICD-10-CM | POA: Diagnosis not present

## 2020-09-10 DIAGNOSIS — N2581 Secondary hyperparathyroidism of renal origin: Secondary | ICD-10-CM | POA: Diagnosis not present

## 2020-09-10 DIAGNOSIS — D509 Iron deficiency anemia, unspecified: Secondary | ICD-10-CM | POA: Diagnosis not present

## 2020-09-10 DIAGNOSIS — Z992 Dependence on renal dialysis: Secondary | ICD-10-CM | POA: Diagnosis not present

## 2020-09-10 DIAGNOSIS — N186 End stage renal disease: Secondary | ICD-10-CM | POA: Diagnosis not present

## 2020-09-11 DIAGNOSIS — N2581 Secondary hyperparathyroidism of renal origin: Secondary | ICD-10-CM | POA: Diagnosis not present

## 2020-09-11 DIAGNOSIS — N186 End stage renal disease: Secondary | ICD-10-CM | POA: Diagnosis not present

## 2020-09-11 DIAGNOSIS — D509 Iron deficiency anemia, unspecified: Secondary | ICD-10-CM | POA: Diagnosis not present

## 2020-09-11 DIAGNOSIS — Z992 Dependence on renal dialysis: Secondary | ICD-10-CM | POA: Diagnosis not present

## 2020-09-12 DIAGNOSIS — N2581 Secondary hyperparathyroidism of renal origin: Secondary | ICD-10-CM | POA: Diagnosis not present

## 2020-09-12 DIAGNOSIS — Z992 Dependence on renal dialysis: Secondary | ICD-10-CM | POA: Diagnosis not present

## 2020-09-12 DIAGNOSIS — N186 End stage renal disease: Secondary | ICD-10-CM | POA: Diagnosis not present

## 2020-09-12 DIAGNOSIS — D509 Iron deficiency anemia, unspecified: Secondary | ICD-10-CM | POA: Diagnosis not present

## 2020-09-13 DIAGNOSIS — N186 End stage renal disease: Secondary | ICD-10-CM | POA: Diagnosis not present

## 2020-09-13 DIAGNOSIS — Z992 Dependence on renal dialysis: Secondary | ICD-10-CM | POA: Diagnosis not present

## 2020-09-13 DIAGNOSIS — D509 Iron deficiency anemia, unspecified: Secondary | ICD-10-CM | POA: Diagnosis not present

## 2020-09-13 DIAGNOSIS — N2581 Secondary hyperparathyroidism of renal origin: Secondary | ICD-10-CM | POA: Diagnosis not present

## 2020-09-15 DIAGNOSIS — N2581 Secondary hyperparathyroidism of renal origin: Secondary | ICD-10-CM | POA: Diagnosis not present

## 2020-09-15 DIAGNOSIS — N186 End stage renal disease: Secondary | ICD-10-CM | POA: Diagnosis not present

## 2020-09-15 DIAGNOSIS — D509 Iron deficiency anemia, unspecified: Secondary | ICD-10-CM | POA: Diagnosis not present

## 2020-09-15 DIAGNOSIS — Z992 Dependence on renal dialysis: Secondary | ICD-10-CM | POA: Diagnosis not present

## 2020-09-16 DIAGNOSIS — D509 Iron deficiency anemia, unspecified: Secondary | ICD-10-CM | POA: Diagnosis not present

## 2020-09-16 DIAGNOSIS — Z992 Dependence on renal dialysis: Secondary | ICD-10-CM | POA: Diagnosis not present

## 2020-09-16 DIAGNOSIS — N2581 Secondary hyperparathyroidism of renal origin: Secondary | ICD-10-CM | POA: Diagnosis not present

## 2020-09-16 DIAGNOSIS — N186 End stage renal disease: Secondary | ICD-10-CM | POA: Diagnosis not present

## 2020-09-17 DIAGNOSIS — Z992 Dependence on renal dialysis: Secondary | ICD-10-CM | POA: Diagnosis not present

## 2020-09-17 DIAGNOSIS — D509 Iron deficiency anemia, unspecified: Secondary | ICD-10-CM | POA: Diagnosis not present

## 2020-09-17 DIAGNOSIS — N186 End stage renal disease: Secondary | ICD-10-CM | POA: Diagnosis not present

## 2020-09-17 DIAGNOSIS — N2581 Secondary hyperparathyroidism of renal origin: Secondary | ICD-10-CM | POA: Diagnosis not present

## 2020-09-18 DIAGNOSIS — N186 End stage renal disease: Secondary | ICD-10-CM | POA: Diagnosis not present

## 2020-09-18 DIAGNOSIS — Z992 Dependence on renal dialysis: Secondary | ICD-10-CM | POA: Diagnosis not present

## 2020-09-18 DIAGNOSIS — D509 Iron deficiency anemia, unspecified: Secondary | ICD-10-CM | POA: Diagnosis not present

## 2020-09-18 DIAGNOSIS — N2581 Secondary hyperparathyroidism of renal origin: Secondary | ICD-10-CM | POA: Diagnosis not present

## 2020-09-19 DIAGNOSIS — D509 Iron deficiency anemia, unspecified: Secondary | ICD-10-CM | POA: Diagnosis not present

## 2020-09-19 DIAGNOSIS — Z992 Dependence on renal dialysis: Secondary | ICD-10-CM | POA: Diagnosis not present

## 2020-09-19 DIAGNOSIS — N186 End stage renal disease: Secondary | ICD-10-CM | POA: Diagnosis not present

## 2020-09-19 DIAGNOSIS — N2581 Secondary hyperparathyroidism of renal origin: Secondary | ICD-10-CM | POA: Diagnosis not present

## 2020-09-20 DIAGNOSIS — D509 Iron deficiency anemia, unspecified: Secondary | ICD-10-CM | POA: Diagnosis not present

## 2020-09-20 DIAGNOSIS — Z992 Dependence on renal dialysis: Secondary | ICD-10-CM | POA: Diagnosis not present

## 2020-09-20 DIAGNOSIS — N2581 Secondary hyperparathyroidism of renal origin: Secondary | ICD-10-CM | POA: Diagnosis not present

## 2020-09-20 DIAGNOSIS — N186 End stage renal disease: Secondary | ICD-10-CM | POA: Diagnosis not present

## 2020-09-22 DIAGNOSIS — N2581 Secondary hyperparathyroidism of renal origin: Secondary | ICD-10-CM | POA: Diagnosis not present

## 2020-09-22 DIAGNOSIS — N186 End stage renal disease: Secondary | ICD-10-CM | POA: Diagnosis not present

## 2020-09-22 DIAGNOSIS — Z992 Dependence on renal dialysis: Secondary | ICD-10-CM | POA: Diagnosis not present

## 2020-09-22 DIAGNOSIS — D509 Iron deficiency anemia, unspecified: Secondary | ICD-10-CM | POA: Diagnosis not present

## 2020-09-23 DIAGNOSIS — N186 End stage renal disease: Secondary | ICD-10-CM | POA: Diagnosis not present

## 2020-09-23 DIAGNOSIS — Z992 Dependence on renal dialysis: Secondary | ICD-10-CM | POA: Diagnosis not present

## 2020-09-23 DIAGNOSIS — D509 Iron deficiency anemia, unspecified: Secondary | ICD-10-CM | POA: Diagnosis not present

## 2020-09-23 DIAGNOSIS — N2581 Secondary hyperparathyroidism of renal origin: Secondary | ICD-10-CM | POA: Diagnosis not present

## 2020-09-24 DIAGNOSIS — N186 End stage renal disease: Secondary | ICD-10-CM | POA: Diagnosis not present

## 2020-09-24 DIAGNOSIS — N2581 Secondary hyperparathyroidism of renal origin: Secondary | ICD-10-CM | POA: Diagnosis not present

## 2020-09-24 DIAGNOSIS — Z992 Dependence on renal dialysis: Secondary | ICD-10-CM | POA: Diagnosis not present

## 2020-09-24 DIAGNOSIS — D509 Iron deficiency anemia, unspecified: Secondary | ICD-10-CM | POA: Diagnosis not present

## 2020-09-25 DIAGNOSIS — D509 Iron deficiency anemia, unspecified: Secondary | ICD-10-CM | POA: Diagnosis not present

## 2020-09-25 DIAGNOSIS — Z992 Dependence on renal dialysis: Secondary | ICD-10-CM | POA: Diagnosis not present

## 2020-09-25 DIAGNOSIS — N2581 Secondary hyperparathyroidism of renal origin: Secondary | ICD-10-CM | POA: Diagnosis not present

## 2020-09-25 DIAGNOSIS — N186 End stage renal disease: Secondary | ICD-10-CM | POA: Diagnosis not present

## 2020-09-26 DIAGNOSIS — Z992 Dependence on renal dialysis: Secondary | ICD-10-CM | POA: Diagnosis not present

## 2020-09-26 DIAGNOSIS — N186 End stage renal disease: Secondary | ICD-10-CM | POA: Diagnosis not present

## 2020-09-26 DIAGNOSIS — N2581 Secondary hyperparathyroidism of renal origin: Secondary | ICD-10-CM | POA: Diagnosis not present

## 2020-09-26 DIAGNOSIS — D509 Iron deficiency anemia, unspecified: Secondary | ICD-10-CM | POA: Diagnosis not present

## 2020-09-27 DIAGNOSIS — Z992 Dependence on renal dialysis: Secondary | ICD-10-CM | POA: Diagnosis not present

## 2020-09-27 DIAGNOSIS — N186 End stage renal disease: Secondary | ICD-10-CM | POA: Diagnosis not present

## 2020-09-27 DIAGNOSIS — N2581 Secondary hyperparathyroidism of renal origin: Secondary | ICD-10-CM | POA: Diagnosis not present

## 2020-09-27 DIAGNOSIS — D509 Iron deficiency anemia, unspecified: Secondary | ICD-10-CM | POA: Diagnosis not present

## 2020-09-29 DIAGNOSIS — N2581 Secondary hyperparathyroidism of renal origin: Secondary | ICD-10-CM | POA: Diagnosis not present

## 2020-09-29 DIAGNOSIS — D509 Iron deficiency anemia, unspecified: Secondary | ICD-10-CM | POA: Diagnosis not present

## 2020-09-29 DIAGNOSIS — N186 End stage renal disease: Secondary | ICD-10-CM | POA: Diagnosis not present

## 2020-09-29 DIAGNOSIS — Z992 Dependence on renal dialysis: Secondary | ICD-10-CM | POA: Diagnosis not present

## 2020-09-30 DIAGNOSIS — Z992 Dependence on renal dialysis: Secondary | ICD-10-CM | POA: Diagnosis not present

## 2020-09-30 DIAGNOSIS — N2581 Secondary hyperparathyroidism of renal origin: Secondary | ICD-10-CM | POA: Diagnosis not present

## 2020-09-30 DIAGNOSIS — D509 Iron deficiency anemia, unspecified: Secondary | ICD-10-CM | POA: Diagnosis not present

## 2020-09-30 DIAGNOSIS — N186 End stage renal disease: Secondary | ICD-10-CM | POA: Diagnosis not present

## 2020-10-01 DIAGNOSIS — N2581 Secondary hyperparathyroidism of renal origin: Secondary | ICD-10-CM | POA: Diagnosis not present

## 2020-10-01 DIAGNOSIS — D509 Iron deficiency anemia, unspecified: Secondary | ICD-10-CM | POA: Diagnosis not present

## 2020-10-01 DIAGNOSIS — N186 End stage renal disease: Secondary | ICD-10-CM | POA: Diagnosis not present

## 2020-10-01 DIAGNOSIS — Z992 Dependence on renal dialysis: Secondary | ICD-10-CM | POA: Diagnosis not present

## 2020-10-02 DIAGNOSIS — Z992 Dependence on renal dialysis: Secondary | ICD-10-CM | POA: Diagnosis not present

## 2020-10-02 DIAGNOSIS — D509 Iron deficiency anemia, unspecified: Secondary | ICD-10-CM | POA: Diagnosis not present

## 2020-10-02 DIAGNOSIS — N2581 Secondary hyperparathyroidism of renal origin: Secondary | ICD-10-CM | POA: Diagnosis not present

## 2020-10-02 DIAGNOSIS — N186 End stage renal disease: Secondary | ICD-10-CM | POA: Diagnosis not present

## 2020-10-03 DIAGNOSIS — N186 End stage renal disease: Secondary | ICD-10-CM | POA: Diagnosis not present

## 2020-10-03 DIAGNOSIS — D509 Iron deficiency anemia, unspecified: Secondary | ICD-10-CM | POA: Diagnosis not present

## 2020-10-03 DIAGNOSIS — Z992 Dependence on renal dialysis: Secondary | ICD-10-CM | POA: Diagnosis not present

## 2020-10-03 DIAGNOSIS — N2581 Secondary hyperparathyroidism of renal origin: Secondary | ICD-10-CM | POA: Diagnosis not present

## 2020-10-04 DIAGNOSIS — N186 End stage renal disease: Secondary | ICD-10-CM | POA: Diagnosis not present

## 2020-10-04 DIAGNOSIS — D509 Iron deficiency anemia, unspecified: Secondary | ICD-10-CM | POA: Diagnosis not present

## 2020-10-04 DIAGNOSIS — Z992 Dependence on renal dialysis: Secondary | ICD-10-CM | POA: Diagnosis not present

## 2020-10-04 DIAGNOSIS — N2581 Secondary hyperparathyroidism of renal origin: Secondary | ICD-10-CM | POA: Diagnosis not present

## 2020-10-06 DIAGNOSIS — Z992 Dependence on renal dialysis: Secondary | ICD-10-CM | POA: Diagnosis not present

## 2020-10-06 DIAGNOSIS — N186 End stage renal disease: Secondary | ICD-10-CM | POA: Diagnosis not present

## 2020-10-06 DIAGNOSIS — N2581 Secondary hyperparathyroidism of renal origin: Secondary | ICD-10-CM | POA: Diagnosis not present

## 2020-10-06 DIAGNOSIS — D509 Iron deficiency anemia, unspecified: Secondary | ICD-10-CM | POA: Diagnosis not present

## 2020-10-07 DIAGNOSIS — D509 Iron deficiency anemia, unspecified: Secondary | ICD-10-CM | POA: Diagnosis not present

## 2020-10-07 DIAGNOSIS — N186 End stage renal disease: Secondary | ICD-10-CM | POA: Diagnosis not present

## 2020-10-07 DIAGNOSIS — Z992 Dependence on renal dialysis: Secondary | ICD-10-CM | POA: Diagnosis not present

## 2020-10-07 DIAGNOSIS — N2581 Secondary hyperparathyroidism of renal origin: Secondary | ICD-10-CM | POA: Diagnosis not present

## 2020-10-08 DIAGNOSIS — N186 End stage renal disease: Secondary | ICD-10-CM | POA: Diagnosis not present

## 2020-10-08 DIAGNOSIS — N2581 Secondary hyperparathyroidism of renal origin: Secondary | ICD-10-CM | POA: Diagnosis not present

## 2020-10-08 DIAGNOSIS — D509 Iron deficiency anemia, unspecified: Secondary | ICD-10-CM | POA: Diagnosis not present

## 2020-10-08 DIAGNOSIS — Z992 Dependence on renal dialysis: Secondary | ICD-10-CM | POA: Diagnosis not present

## 2020-10-09 DIAGNOSIS — N2581 Secondary hyperparathyroidism of renal origin: Secondary | ICD-10-CM | POA: Diagnosis not present

## 2020-10-09 DIAGNOSIS — D509 Iron deficiency anemia, unspecified: Secondary | ICD-10-CM | POA: Diagnosis not present

## 2020-10-09 DIAGNOSIS — Z992 Dependence on renal dialysis: Secondary | ICD-10-CM | POA: Diagnosis not present

## 2020-10-09 DIAGNOSIS — N186 End stage renal disease: Secondary | ICD-10-CM | POA: Diagnosis not present

## 2020-10-10 DIAGNOSIS — Z992 Dependence on renal dialysis: Secondary | ICD-10-CM | POA: Diagnosis not present

## 2020-10-10 DIAGNOSIS — N186 End stage renal disease: Secondary | ICD-10-CM | POA: Diagnosis not present

## 2020-10-10 DIAGNOSIS — D509 Iron deficiency anemia, unspecified: Secondary | ICD-10-CM | POA: Diagnosis not present

## 2020-10-10 DIAGNOSIS — N2581 Secondary hyperparathyroidism of renal origin: Secondary | ICD-10-CM | POA: Diagnosis not present

## 2020-10-11 DIAGNOSIS — N186 End stage renal disease: Secondary | ICD-10-CM | POA: Diagnosis not present

## 2020-10-11 DIAGNOSIS — Z992 Dependence on renal dialysis: Secondary | ICD-10-CM | POA: Diagnosis not present

## 2020-10-11 DIAGNOSIS — N2581 Secondary hyperparathyroidism of renal origin: Secondary | ICD-10-CM | POA: Diagnosis not present

## 2020-10-11 DIAGNOSIS — D509 Iron deficiency anemia, unspecified: Secondary | ICD-10-CM | POA: Diagnosis not present

## 2020-10-13 DIAGNOSIS — Z992 Dependence on renal dialysis: Secondary | ICD-10-CM | POA: Diagnosis not present

## 2020-10-13 DIAGNOSIS — N186 End stage renal disease: Secondary | ICD-10-CM | POA: Diagnosis not present

## 2020-10-13 DIAGNOSIS — D509 Iron deficiency anemia, unspecified: Secondary | ICD-10-CM | POA: Diagnosis not present

## 2020-10-13 DIAGNOSIS — N2581 Secondary hyperparathyroidism of renal origin: Secondary | ICD-10-CM | POA: Diagnosis not present

## 2020-10-14 DIAGNOSIS — D509 Iron deficiency anemia, unspecified: Secondary | ICD-10-CM | POA: Diagnosis not present

## 2020-10-14 DIAGNOSIS — Z992 Dependence on renal dialysis: Secondary | ICD-10-CM | POA: Diagnosis not present

## 2020-10-14 DIAGNOSIS — N2581 Secondary hyperparathyroidism of renal origin: Secondary | ICD-10-CM | POA: Diagnosis not present

## 2020-10-14 DIAGNOSIS — N186 End stage renal disease: Secondary | ICD-10-CM | POA: Diagnosis not present

## 2020-10-15 DIAGNOSIS — D509 Iron deficiency anemia, unspecified: Secondary | ICD-10-CM | POA: Diagnosis not present

## 2020-10-15 DIAGNOSIS — N186 End stage renal disease: Secondary | ICD-10-CM | POA: Diagnosis not present

## 2020-10-15 DIAGNOSIS — Z992 Dependence on renal dialysis: Secondary | ICD-10-CM | POA: Diagnosis not present

## 2020-10-15 DIAGNOSIS — N2581 Secondary hyperparathyroidism of renal origin: Secondary | ICD-10-CM | POA: Diagnosis not present

## 2020-10-16 DIAGNOSIS — N186 End stage renal disease: Secondary | ICD-10-CM | POA: Diagnosis not present

## 2020-10-16 DIAGNOSIS — N2581 Secondary hyperparathyroidism of renal origin: Secondary | ICD-10-CM | POA: Diagnosis not present

## 2020-10-16 DIAGNOSIS — D509 Iron deficiency anemia, unspecified: Secondary | ICD-10-CM | POA: Diagnosis not present

## 2020-10-16 DIAGNOSIS — Z992 Dependence on renal dialysis: Secondary | ICD-10-CM | POA: Diagnosis not present

## 2020-10-17 DIAGNOSIS — N2581 Secondary hyperparathyroidism of renal origin: Secondary | ICD-10-CM | POA: Diagnosis not present

## 2020-10-17 DIAGNOSIS — D509 Iron deficiency anemia, unspecified: Secondary | ICD-10-CM | POA: Diagnosis not present

## 2020-10-17 DIAGNOSIS — Z992 Dependence on renal dialysis: Secondary | ICD-10-CM | POA: Diagnosis not present

## 2020-10-17 DIAGNOSIS — N186 End stage renal disease: Secondary | ICD-10-CM | POA: Diagnosis not present

## 2020-10-18 DIAGNOSIS — D509 Iron deficiency anemia, unspecified: Secondary | ICD-10-CM | POA: Diagnosis not present

## 2020-10-18 DIAGNOSIS — N186 End stage renal disease: Secondary | ICD-10-CM | POA: Diagnosis not present

## 2020-10-18 DIAGNOSIS — Z992 Dependence on renal dialysis: Secondary | ICD-10-CM | POA: Diagnosis not present

## 2020-10-18 DIAGNOSIS — N2581 Secondary hyperparathyroidism of renal origin: Secondary | ICD-10-CM | POA: Diagnosis not present

## 2020-10-20 DIAGNOSIS — N2581 Secondary hyperparathyroidism of renal origin: Secondary | ICD-10-CM | POA: Diagnosis not present

## 2020-10-20 DIAGNOSIS — Z992 Dependence on renal dialysis: Secondary | ICD-10-CM | POA: Diagnosis not present

## 2020-10-20 DIAGNOSIS — N186 End stage renal disease: Secondary | ICD-10-CM | POA: Diagnosis not present

## 2020-10-20 DIAGNOSIS — D509 Iron deficiency anemia, unspecified: Secondary | ICD-10-CM | POA: Diagnosis not present

## 2020-10-21 DIAGNOSIS — N186 End stage renal disease: Secondary | ICD-10-CM | POA: Diagnosis not present

## 2020-10-21 DIAGNOSIS — N2581 Secondary hyperparathyroidism of renal origin: Secondary | ICD-10-CM | POA: Diagnosis not present

## 2020-10-21 DIAGNOSIS — D509 Iron deficiency anemia, unspecified: Secondary | ICD-10-CM | POA: Diagnosis not present

## 2020-10-21 DIAGNOSIS — Z992 Dependence on renal dialysis: Secondary | ICD-10-CM | POA: Diagnosis not present

## 2020-10-22 DIAGNOSIS — N186 End stage renal disease: Secondary | ICD-10-CM | POA: Diagnosis not present

## 2020-10-22 DIAGNOSIS — D509 Iron deficiency anemia, unspecified: Secondary | ICD-10-CM | POA: Diagnosis not present

## 2020-10-22 DIAGNOSIS — Z992 Dependence on renal dialysis: Secondary | ICD-10-CM | POA: Diagnosis not present

## 2020-10-22 DIAGNOSIS — N2581 Secondary hyperparathyroidism of renal origin: Secondary | ICD-10-CM | POA: Diagnosis not present

## 2020-10-23 DIAGNOSIS — Z992 Dependence on renal dialysis: Secondary | ICD-10-CM | POA: Diagnosis not present

## 2020-10-23 DIAGNOSIS — N2581 Secondary hyperparathyroidism of renal origin: Secondary | ICD-10-CM | POA: Diagnosis not present

## 2020-10-23 DIAGNOSIS — D509 Iron deficiency anemia, unspecified: Secondary | ICD-10-CM | POA: Diagnosis not present

## 2020-10-23 DIAGNOSIS — N186 End stage renal disease: Secondary | ICD-10-CM | POA: Diagnosis not present

## 2020-10-24 DIAGNOSIS — N2581 Secondary hyperparathyroidism of renal origin: Secondary | ICD-10-CM | POA: Diagnosis not present

## 2020-10-24 DIAGNOSIS — Z992 Dependence on renal dialysis: Secondary | ICD-10-CM | POA: Diagnosis not present

## 2020-10-24 DIAGNOSIS — D509 Iron deficiency anemia, unspecified: Secondary | ICD-10-CM | POA: Diagnosis not present

## 2020-10-24 DIAGNOSIS — N186 End stage renal disease: Secondary | ICD-10-CM | POA: Diagnosis not present

## 2020-10-25 DIAGNOSIS — D509 Iron deficiency anemia, unspecified: Secondary | ICD-10-CM | POA: Diagnosis not present

## 2020-10-25 DIAGNOSIS — Z992 Dependence on renal dialysis: Secondary | ICD-10-CM | POA: Diagnosis not present

## 2020-10-25 DIAGNOSIS — N186 End stage renal disease: Secondary | ICD-10-CM | POA: Diagnosis not present

## 2020-10-25 DIAGNOSIS — N2581 Secondary hyperparathyroidism of renal origin: Secondary | ICD-10-CM | POA: Diagnosis not present

## 2020-10-27 DIAGNOSIS — D509 Iron deficiency anemia, unspecified: Secondary | ICD-10-CM | POA: Diagnosis not present

## 2020-10-27 DIAGNOSIS — Z992 Dependence on renal dialysis: Secondary | ICD-10-CM | POA: Diagnosis not present

## 2020-10-27 DIAGNOSIS — N2581 Secondary hyperparathyroidism of renal origin: Secondary | ICD-10-CM | POA: Diagnosis not present

## 2020-10-27 DIAGNOSIS — N186 End stage renal disease: Secondary | ICD-10-CM | POA: Diagnosis not present

## 2020-10-28 DIAGNOSIS — N2581 Secondary hyperparathyroidism of renal origin: Secondary | ICD-10-CM | POA: Diagnosis not present

## 2020-10-28 DIAGNOSIS — D509 Iron deficiency anemia, unspecified: Secondary | ICD-10-CM | POA: Diagnosis not present

## 2020-10-28 DIAGNOSIS — Z992 Dependence on renal dialysis: Secondary | ICD-10-CM | POA: Diagnosis not present

## 2020-10-28 DIAGNOSIS — N186 End stage renal disease: Secondary | ICD-10-CM | POA: Diagnosis not present

## 2020-10-29 DIAGNOSIS — Z992 Dependence on renal dialysis: Secondary | ICD-10-CM | POA: Diagnosis not present

## 2020-10-29 DIAGNOSIS — N2581 Secondary hyperparathyroidism of renal origin: Secondary | ICD-10-CM | POA: Diagnosis not present

## 2020-10-29 DIAGNOSIS — D509 Iron deficiency anemia, unspecified: Secondary | ICD-10-CM | POA: Diagnosis not present

## 2020-10-29 DIAGNOSIS — N186 End stage renal disease: Secondary | ICD-10-CM | POA: Diagnosis not present

## 2020-10-30 DIAGNOSIS — N186 End stage renal disease: Secondary | ICD-10-CM | POA: Diagnosis not present

## 2020-10-30 DIAGNOSIS — D509 Iron deficiency anemia, unspecified: Secondary | ICD-10-CM | POA: Diagnosis not present

## 2020-10-30 DIAGNOSIS — Z992 Dependence on renal dialysis: Secondary | ICD-10-CM | POA: Diagnosis not present

## 2020-10-30 DIAGNOSIS — N2581 Secondary hyperparathyroidism of renal origin: Secondary | ICD-10-CM | POA: Diagnosis not present

## 2020-10-31 DIAGNOSIS — Z992 Dependence on renal dialysis: Secondary | ICD-10-CM | POA: Diagnosis not present

## 2020-10-31 DIAGNOSIS — D509 Iron deficiency anemia, unspecified: Secondary | ICD-10-CM | POA: Diagnosis not present

## 2020-10-31 DIAGNOSIS — N186 End stage renal disease: Secondary | ICD-10-CM | POA: Diagnosis not present

## 2020-10-31 DIAGNOSIS — N2581 Secondary hyperparathyroidism of renal origin: Secondary | ICD-10-CM | POA: Diagnosis not present

## 2020-11-01 ENCOUNTER — Other Ambulatory Visit: Payer: Self-pay

## 2020-11-01 ENCOUNTER — Inpatient Hospital Stay
Admission: EM | Admit: 2020-11-01 | Discharge: 2020-11-05 | DRG: 907 | Disposition: A | Discharge status: Home / Self Care | Payer: Medicare Other | Attending: Internal Medicine | Admitting: Internal Medicine

## 2020-11-01 DIAGNOSIS — Z79899 Other long term (current) drug therapy: Secondary | ICD-10-CM

## 2020-11-01 DIAGNOSIS — I10 Essential (primary) hypertension: Secondary | ICD-10-CM | POA: Diagnosis not present

## 2020-11-01 DIAGNOSIS — I132 Hypertensive heart and chronic kidney disease with heart failure and with stage 5 chronic kidney disease, or end stage renal disease: Secondary | ICD-10-CM | POA: Diagnosis not present

## 2020-11-01 DIAGNOSIS — T82591A Other mechanical complication of surgically created arteriovenous shunt, initial encounter: Secondary | ICD-10-CM | POA: Diagnosis not present

## 2020-11-01 DIAGNOSIS — N2581 Secondary hyperparathyroidism of renal origin: Secondary | ICD-10-CM | POA: Diagnosis not present

## 2020-11-01 DIAGNOSIS — Z7902 Long term (current) use of antithrombotics/antiplatelets: Secondary | ICD-10-CM

## 2020-11-01 DIAGNOSIS — I351 Nonrheumatic aortic (valve) insufficiency: Secondary | ICD-10-CM | POA: Diagnosis not present

## 2020-11-01 DIAGNOSIS — I7100 Dissection of unspecified site of aorta: Secondary | ICD-10-CM | POA: Diagnosis present

## 2020-11-01 DIAGNOSIS — N186 End stage renal disease: Secondary | ICD-10-CM | POA: Diagnosis present

## 2020-11-01 DIAGNOSIS — E1165 Type 2 diabetes mellitus with hyperglycemia: Secondary | ICD-10-CM | POA: Diagnosis not present

## 2020-11-01 DIAGNOSIS — T8249XA Other complication of vascular dialysis catheter, initial encounter: Secondary | ICD-10-CM | POA: Diagnosis not present

## 2020-11-01 DIAGNOSIS — I071 Rheumatic tricuspid insufficiency: Secondary | ICD-10-CM | POA: Diagnosis present

## 2020-11-01 DIAGNOSIS — I480 Paroxysmal atrial fibrillation: Secondary | ICD-10-CM | POA: Diagnosis present

## 2020-11-01 DIAGNOSIS — Y828 Other medical devices associated with adverse incidents: Secondary | ICD-10-CM | POA: Diagnosis not present

## 2020-11-01 DIAGNOSIS — I2510 Atherosclerotic heart disease of native coronary artery without angina pectoris: Secondary | ICD-10-CM | POA: Diagnosis not present

## 2020-11-01 DIAGNOSIS — D631 Anemia in chronic kidney disease: Secondary | ICD-10-CM | POA: Diagnosis present

## 2020-11-01 DIAGNOSIS — Z951 Presence of aortocoronary bypass graft: Secondary | ICD-10-CM | POA: Diagnosis not present

## 2020-11-01 DIAGNOSIS — Z20822 Contact with and (suspected) exposure to covid-19: Secondary | ICD-10-CM | POA: Diagnosis not present

## 2020-11-01 DIAGNOSIS — Y812 Prosthetic and other implants, materials and accessory general- and plastic-surgery devices associated with adverse incidents: Secondary | ICD-10-CM | POA: Diagnosis present

## 2020-11-01 DIAGNOSIS — Z880 Allergy status to penicillin: Secondary | ICD-10-CM | POA: Diagnosis not present

## 2020-11-01 DIAGNOSIS — Z7984 Long term (current) use of oral hypoglycemic drugs: Secondary | ICD-10-CM | POA: Diagnosis not present

## 2020-11-01 DIAGNOSIS — Z955 Presence of coronary angioplasty implant and graft: Secondary | ICD-10-CM | POA: Diagnosis not present

## 2020-11-01 DIAGNOSIS — E039 Hypothyroidism, unspecified: Secondary | ICD-10-CM | POA: Diagnosis present

## 2020-11-01 DIAGNOSIS — Z85828 Personal history of other malignant neoplasm of skin: Secondary | ICD-10-CM | POA: Diagnosis not present

## 2020-11-01 DIAGNOSIS — E785 Hyperlipidemia, unspecified: Secondary | ICD-10-CM | POA: Diagnosis present

## 2020-11-01 DIAGNOSIS — E11649 Type 2 diabetes mellitus with hypoglycemia without coma: Secondary | ICD-10-CM | POA: Diagnosis not present

## 2020-11-01 DIAGNOSIS — E1122 Type 2 diabetes mellitus with diabetic chronic kidney disease: Secondary | ICD-10-CM | POA: Diagnosis not present

## 2020-11-01 DIAGNOSIS — I120 Hypertensive chronic kidney disease with stage 5 chronic kidney disease or end stage renal disease: Secondary | ICD-10-CM | POA: Diagnosis not present

## 2020-11-01 DIAGNOSIS — I517 Cardiomegaly: Secondary | ICD-10-CM | POA: Diagnosis not present

## 2020-11-01 DIAGNOSIS — Z8249 Family history of ischemic heart disease and other diseases of the circulatory system: Secondary | ICD-10-CM | POA: Diagnosis not present

## 2020-11-01 DIAGNOSIS — Z01810 Encounter for preprocedural cardiovascular examination: Secondary | ICD-10-CM | POA: Diagnosis not present

## 2020-11-01 DIAGNOSIS — E1169 Type 2 diabetes mellitus with other specified complication: Secondary | ICD-10-CM | POA: Diagnosis not present

## 2020-11-01 DIAGNOSIS — Z87891 Personal history of nicotine dependence: Secondary | ICD-10-CM

## 2020-11-01 DIAGNOSIS — E782 Mixed hyperlipidemia: Secondary | ICD-10-CM | POA: Diagnosis not present

## 2020-11-01 DIAGNOSIS — I252 Old myocardial infarction: Secondary | ICD-10-CM

## 2020-11-01 DIAGNOSIS — I4821 Permanent atrial fibrillation: Secondary | ICD-10-CM | POA: Diagnosis not present

## 2020-11-01 DIAGNOSIS — I5022 Chronic systolic (congestive) heart failure: Secondary | ICD-10-CM | POA: Diagnosis not present

## 2020-11-01 DIAGNOSIS — T85691A Other mechanical complication of intraperitoneal dialysis catheter, initial encounter: Secondary | ICD-10-CM

## 2020-11-01 DIAGNOSIS — Z66 Do not resuscitate: Secondary | ICD-10-CM | POA: Diagnosis present

## 2020-11-01 DIAGNOSIS — D509 Iron deficiency anemia, unspecified: Secondary | ICD-10-CM | POA: Diagnosis not present

## 2020-11-01 DIAGNOSIS — Z992 Dependence on renal dialysis: Secondary | ICD-10-CM | POA: Diagnosis not present

## 2020-11-01 DIAGNOSIS — S37009A Unspecified injury of unspecified kidney, initial encounter: Secondary | ICD-10-CM

## 2020-11-01 DIAGNOSIS — T85621A Displacement of intraperitoneal dialysis catheter, initial encounter: Secondary | ICD-10-CM | POA: Diagnosis not present

## 2020-11-01 LAB — COMPREHENSIVE METABOLIC PANEL
ALT: 54 U/L — ABNORMAL HIGH (ref 0–44)
AST: 37 U/L (ref 15–41)
Albumin: 3.3 g/dL — ABNORMAL LOW (ref 3.5–5.0)
Alkaline Phosphatase: 142 U/L — ABNORMAL HIGH (ref 38–126)
Anion gap: 6 (ref 5–15)
BUN: 50 mg/dL — ABNORMAL HIGH (ref 8–23)
CO2: 29 mmol/L (ref 22–32)
Calcium: 8.9 mg/dL (ref 8.9–10.3)
Chloride: 102 mmol/L (ref 98–111)
Creatinine, Ser: 2.89 mg/dL — ABNORMAL HIGH (ref 0.61–1.24)
GFR, Estimated: 20 mL/min — ABNORMAL LOW (ref >60)
Glucose, Bld: 158 mg/dL — ABNORMAL HIGH (ref 70–99)
Potassium: 4.6 mmol/L (ref 3.5–5.1)
Sodium: 137 mmol/L (ref 135–145)
Total Bilirubin: 0.6 mg/dL (ref 0.3–1.2)
Total Protein: 6.4 g/dL — ABNORMAL LOW (ref 6.5–8.1)

## 2020-11-01 LAB — CBC WITH DIFFERENTIAL/PLATELET
Abs Immature Granulocytes: 0.02 10*3/uL (ref 0.00–0.07)
Basophils Absolute: 0.1 10*3/uL (ref 0.0–0.1)
Basophils Relative: 1 %
Eosinophils Absolute: 0.8 10*3/uL — ABNORMAL HIGH (ref 0.0–0.5)
Eosinophils Relative: 14 %
HCT: 37.2 % — ABNORMAL LOW (ref 39.0–52.0)
Hemoglobin: 12.2 g/dL — ABNORMAL LOW (ref 13.0–17.0)
Immature Granulocytes: 0 %
Lymphocytes Relative: 14 %
Lymphs Abs: 0.7 10*3/uL (ref 0.7–4.0)
MCH: 32.1 pg (ref 26.0–34.0)
MCHC: 32.8 g/dL (ref 30.0–36.0)
MCV: 97.9 fL (ref 80.0–100.0)
Monocytes Absolute: 0.7 10*3/uL (ref 0.1–1.0)
Monocytes Relative: 14 %
Neutro Abs: 3 10*3/uL (ref 1.7–7.7)
Neutrophils Relative %: 57 %
Platelets: 121 10*3/uL — ABNORMAL LOW (ref 150–400)
RBC: 3.8 MIL/uL — ABNORMAL LOW (ref 4.22–5.81)
RDW: 13.8 % (ref 11.5–15.5)
WBC: 5.3 10*3/uL (ref 4.0–10.5)
nRBC: 0 % (ref 0.0–0.2)

## 2020-11-01 LAB — CREATININE, SERUM
Creatinine, Ser: 2.91 mg/dL — ABNORMAL HIGH (ref 0.61–1.24)
GFR, Estimated: 20 mL/min — ABNORMAL LOW (ref >60)

## 2020-11-01 LAB — RESP PANEL BY RT-PCR (FLU A&B, COVID) ARPGX2
Influenza A by PCR: NEGATIVE
Influenza B by PCR: NEGATIVE
SARS Coronavirus 2 by RT PCR: NEGATIVE

## 2020-11-01 LAB — GLUCOSE, CAPILLARY: Glucose-Capillary: 157 mg/dL — ABNORMAL HIGH (ref 70–99)

## 2020-11-01 MED ORDER — METOPROLOL SUCCINATE ER 25 MG PO TB24
12.5000 mg | ORAL_TABLET | Freq: Every day | ORAL | Status: DC
  Filled 2020-11-01: qty 0.5
  Filled 2020-11-01: qty 1

## 2020-11-01 MED ORDER — MELATONIN 5 MG PO TABS: 2.5000 mg | ORAL_TABLET | Freq: Every evening | ORAL | Status: DC | PRN

## 2020-11-01 MED ORDER — ACETAMINOPHEN 325 MG PO TABS: 650.0000 mg | ORAL_TABLET | Freq: Four times a day (QID) | ORAL | Status: DC | PRN

## 2020-11-01 MED ORDER — INSULIN ASPART 100 UNIT/ML IJ SOLN
0.0000 [IU] | Freq: Three times a day (TID) | INTRAMUSCULAR | Status: DC
  Administered 2020-11-02: 1 [IU] via SUBCUTANEOUS
  Administered 2020-11-04: 2 [IU] via SUBCUTANEOUS
  Administered 2020-11-05 (×2): 1 [IU] via SUBCUTANEOUS
  Filled 2020-11-01 (×4): qty 1

## 2020-11-01 MED ORDER — AMIODARONE HCL 200 MG PO TABS
200.0000 mg | ORAL_TABLET | Freq: Every day | ORAL | Status: DC
  Administered 2020-11-02 – 2020-11-05 (×3): 200 mg via ORAL
  Filled 2020-11-01 (×3): qty 1

## 2020-11-01 MED ORDER — LABETALOL HCL 5 MG/ML IV SOLN: 10.0000 mg | INTRAVENOUS | Status: DC | PRN

## 2020-11-01 MED ORDER — FERROUS SULFATE 325 (65 FE) MG PO TABS
325.0000 mg | ORAL_TABLET | Freq: Two times a day (BID) | ORAL | Status: DC
  Administered 2020-11-01 – 2020-11-05 (×6): 325 mg via ORAL
  Filled 2020-11-01 (×6): qty 1

## 2020-11-01 MED ORDER — ONDANSETRON HCL 4 MG/2ML IJ SOLN: 4.0000 mg | Freq: Three times a day (TID) | INTRAMUSCULAR | Status: DC | PRN

## 2020-11-01 MED ORDER — ROSUVASTATIN CALCIUM 10 MG PO TABS
40.0000 mg | ORAL_TABLET | Freq: Every day | ORAL | Status: DC
  Administered 2020-11-01 – 2020-11-04 (×4): 40 mg via ORAL
  Filled 2020-11-01: qty 2
  Filled 2020-11-01 (×4): qty 4

## 2020-11-01 MED ORDER — LEVOTHYROXINE SODIUM 100 MCG PO TABS
100.0000 ug | ORAL_TABLET | Freq: Every day | ORAL | Status: DC
  Administered 2020-11-02 – 2020-11-05 (×4): 100 ug via ORAL
  Filled 2020-11-01 (×4): qty 1

## 2020-11-01 MED ORDER — HEPARIN SODIUM (PORCINE) 5000 UNIT/ML IJ SOLN
5000.0000 [IU] | Freq: Three times a day (TID) | INTRAMUSCULAR | Status: DC
  Administered 2020-11-01 – 2020-11-05 (×10): 5000 [IU] via SUBCUTANEOUS
  Filled 2020-11-01 (×11): qty 1

## 2020-11-01 MED ORDER — FUROSEMIDE 40 MG PO TABS
80.0000 mg | ORAL_TABLET | Freq: Two times a day (BID) | ORAL | Status: DC
  Administered 2020-11-01 – 2020-11-05 (×6): 80 mg via ORAL
  Filled 2020-11-01 (×6): qty 2

## 2020-11-01 MED ORDER — METOPROLOL SUCCINATE ER 25 MG PO TB24: 12.5000 mg | ORAL_TABLET | Freq: Every day | ORAL | Status: DC

## 2020-11-01 MED ORDER — CLOPIDOGREL BISULFATE 75 MG PO TABS
75.0000 mg | ORAL_TABLET | Freq: Every day | ORAL | Status: DC
  Administered 2020-11-02 – 2020-11-05 (×3): 75 mg via ORAL
  Filled 2020-11-01 (×3): qty 1

## 2020-11-01 NOTE — ED Provider Notes (Signed)
[facility] Emergency Department Provider Note   ____________________________________________   Event Date/Time   First MD Initiated Contact with Patient 11/01/20 1503     (approximate)  I have reviewed the triage vital signs and the nursing notes.   HISTORY  Chief Complaint Peritoneal Diaylsis Catheter Problem    HPI Matthew [name] is a 87 y.o. male with below stated past medical history and currently on peritoneal dialysis who presents after failing peritoneal dialysis over the last week.  Patient and patient's caretaker at bedside states that they have been having trouble with patient's peritoneal dialysis catheter and draining fluid.  Patient was instructed to hold any further peritoneal dialysis sessions and report to the emergency department as he will likely need hemodialysis.  Patient only complains of abdominal distention, fullness and tightness that is been worsening over the last 2 days.  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   left frontal scalp, [facility]   Squamous cell carcinoma of skin 05/05/2020   left temporal scalp, in situ, [facility] 05/13/20    Patient Active Problem List   Diagnosis Date Noted   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   Sepsis ([place]) 02/15/2018   Fluid overload 01/10/2018   Hypoglycemia 12/09/2017   CAD (coronary artery disease) of bypass graft 12/04/2013    Unstable angina; Class III Angina 12/03/2013   Diabetes mellitus type 2, uncomplicated ([place]) 33/82/5053   HLD (hyperlipidemia) 12/03/2013   HTN (hypertension) 12/03/2013    Past Surgical History:  Procedure Laterality Date   CATARACT EXTRACTION W/PHACO Left 07/26/2017   Procedure: CATARACT EXTRACTION PHACO AND INTRAOCULAR LENS PLACEMENT ([place]);  Surgeon: Birder Robson, MD;  Location: [facility] ORS;  Service: Ophthalmology;  Laterality: Left;  [place]   00:45.8 AP%  13.1 CDE  6.00 Fluid Pack Lot # [id number]   CATARACT EXTRACTION W/PHACO Right 08/17/2017   Procedure: CATARACT EXTRACTION PHACO AND INTRAOCULAR LENS PLACEMENT ([facility]);  Surgeon: Birder Robson, MD;  Location: [facility] ORS;  Service: Ophthalmology;  Laterality: Right;  [place]  01:30 AP% 16.8 CDE 15.24 Fluid pack lot # [id number]   CHOLECYSTECTOMY     CORONARY ANGIOPLASTY     STENTS   CORONARY ARTERY BYPASS GRAFT     DIALYSIS/PERMA CATHETER INSERTION N/A 02/09/2018   Procedure: DIALYSIS/PERMA CATHETER INSERTION;  Surgeon: Algernon Huxley, MD;  Location: [facility];  Service: Cardiovascular;  Laterality: N/A;   FRACTURE SURGERY     ARM   LEFT HEART CATHETERIZATION WITH CORONARY/GRAFT ANGIOGRAM Left 12/04/2013   Procedure: LEFT HEART CATHETERIZATION WITH Beatrix Fetters;  Surgeon: Leonie Man, MD;  Location: [facility] CATH LAB;  Service: Cardiovascular;  Laterality: Left;   PERCUTANEOUS CORONARY STENT INTERVENTION (PCI-S) N/A 12/06/2013   Procedure: PERCUTANEOUS CORONARY STENT INTERVENTION (PCI-S);  Surgeon: Sinclair Grooms, MD;  Location: [facility] CATH LAB;  Service: Cardiovascular;  Laterality: N/A;  VEIN BYPASS SURGERY     [place] Bypass Surgery     Prior to Admission medications   Medication Sig Start Date End Date Taking? Authorizing Provider  amiodarone (PACERONE) 200 MG tablet Take 200 mg by mouth daily.    [provider]  Cholecalciferol (VITAMIN D3) 2000 UNITS TABS Take 2,000 Units by mouth daily with breakfast.     [provider]  ciprofloxacin (CIPRO) 500 MG tablet Take 500 mg by mouth 2 (two) times daily. As directed for cath.    [provider]  clopidogrel (PLAVIX) 75 MG tablet Take 1 tablet (75 mg total) by mouth daily with breakfast. 12/07/13   Almyra Deforest, PA  fenofibrate 160 MG tablet Take 160 mg by mouth daily.    [provider]  ferrous sulfate 325 (65 FE) MG tablet Take 325 mg by mouth 2 (two) times daily with a meal.     [provider]  fluticasone (FLONASE) 50 MCG/ACT nasal spray Place 2 sprays into both nostrils at bedtime.    [provider]  furosemide (LASIX) 40 MG tablet Take 2 tablets (80 mg total) by mouth 2 (two) times daily. Patient taking differently: Take 40 mg by mouth daily.  01/15/18   Saundra Shelling, MD  levocetirizine (XYZAL) 5 MG tablet Take 10 mg by mouth every morning.     [provider]  metoprolol succinate (TOPROL-XL) 50 MG 24 hr tablet Take 50 mg by mouth daily. At lunch time only if systolic BP is above 268. 05/09/19   [provider]  multivitamin (RENA-VIT) TABS tablet Take 1 tablet by mouth daily.    [provider]  nitroGLYCERIN (NITROSTAT) 0.4 MG SL tablet Place 0.4 mg under the tongue every 5 (five) minutes as needed for chest pain.    [provider]  Omega-3 Fatty Acids (FISH OIL) 1000 MG CAPS Take 4,000 mg by mouth daily at 12 noon.     [provider]  potassium chloride SA (K-DUR,KLOR-CON) 20 MEQ tablet Take 2 tablets (40 mEq total) by mouth daily. 01/16/18 05/24/19  Saundra Shelling, MD  PROAIR HFA 108 (90 Base) MCG/ACT inhaler Inhale 2 puffs into the lungs every 6 (six) hours as needed. 09/20/17   [provider]  rosuvastatin (CRESTOR) 40 MG tablet Take 40 mg by mouth daily. 04/21/19   [provider]    Allergies Amoxicillin  Family History  Problem Relation Age of Onset   Heart attack Father 42       died first MI at age 60   Heart failure Mother      Social History Social History   Tobacco Use   Smoking status: Former    Pack years: 0.00    Types: Cigarettes   Smokeless tobacco: Never   Tobacco comments:    quit 30 yrs ago, smoked 14-15 yrs, mainly pipe  Vaping Use   Vaping Use: Never used  Substance Use Topics   Alcohol use: Yes    Alcohol/week: 1.0 standard drink    Types: 1 Glasses of wine per week    Comment: occasionally drinking only   Drug use: No    Review of Systems Constitutional: No fever/chills Eyes: No visual changes. ENT: No sore throat. Cardiovascular: Denies chest pain. Respiratory: Denies shortness of breath. Gastrointestinal: Endorses abdominal distention.  No nausea, no vomiting.  No diarrhea. Genitourinary: Negative for dysuria. Musculoskeletal: Negative for acute arthralgias Skin: Negative for rash. Neurological: Negative for headaches, weakness/numbness/paresthesias in any extremity Psychiatric: Negative for  suicidal ideation/homicidal ideation   ____________________________________________   PHYSICAL EXAM:  VITAL SIGNS: ED Triage Vitals  Enc Vitals Group     BP 11/01/20 1114 (!) 141/56     Pulse Rate 11/01/20 1114 68     Resp 11/01/20 1114 16     Temp 11/01/20 1114 98.1 F (36.7 C)     Temp Source 11/01/20 1114 Oral     SpO2 11/01/20 1114 100 %     Weight 11/01/20 1115 228 lb 9.6 oz (103.7 kg)     Height 11/01/20 1115 6' (1.829 m)     Head Circumference --      Peak Flow --      Pain Score 11/01/20 1115 0     Pain Loc --      Pain Edu? --      Excl. in [place]? --    Constitutional: Alert and oriented. Well appearing and in no acute distress. Eyes: Conjunctivae are normal. PERRL. Head: Atraumatic. Nose: No congestion/rhinnorhea. Mouth/Throat: Mucous membranes are moist. Neck: No stridor Cardiovascular: Grossly normal heart sounds.  Good peripheral circulation. Respiratory: Normal respiratory effort.  No retractions. Gastrointestinal: Soft and nontender.  Distended with  positive fluid wave Musculoskeletal: No obvious deformities Neurologic:  Normal speech and language. No gross focal neurologic deficits are appreciated. Skin:  Skin is warm and dry. No rash noted. Psychiatric: Mood and affect are normal. Speech and behavior are normal.  ____________________________________________   LABS (all labs ordered are listed, but only abnormal results are displayed)  Labs Reviewed  CBC WITH DIFFERENTIAL/PLATELET - Abnormal; Notable for the following components:      Result Value   RBC 3.80 (*)    Hemoglobin 12.2 (*)    HCT 37.2 (*)    Platelets 121 (*)    Eosinophils Absolute 0.8 (*)    All other components within normal limits  COMPREHENSIVE METABOLIC PANEL - Abnormal; Notable for the following components:   Glucose, Bld 158 (*)    BUN 50 (*)    Creatinine, Ser 2.89 (*)    Total Protein 6.4 (*)    Albumin 3.3 (*)    ALT 54 (*)    Alkaline Phosphatase 142 (*)    GFR, Estimated 20 (*)    All other components within normal limits  RESP PANEL BY RT-PCR (FLU A&B, COVID) [id number]     PROCEDURES  Procedure(s) performed (including Critical Care):  .1-3 Lead EKG Interpretation  Date/Time: 11/01/2020 3:25 PM Performed by: Naaman Plummer, MD Authorized by: Naaman Plummer, MD     Interpretation: normal     ECG rate:  64   ECG rate assessment: normal     Rhythm: sinus rhythm     Ectopy: none     Conduction: normal     ____________________________________________   INITIAL IMPRESSION / ASSESSMENT AND PLAN / ED COURSE  As part of my medical decision making, I reviewed the following data within the [place] notes reviewed and incorporated, Labs reviewed, EKG interpreted, Old chart reviewed, Radiograph reviewed and Notes from prior ED visits reviewed and incorporated        Patient is a 87 year old male with the above-stated past medical history presents for complications in his peritoneal dialysis catheter.  Patient's  laboratory evaluation shows no significant electrolyte abnormalities with potassium of 4.6.  Patient not clinically volume overloaded however does have a's slightly distended abdomen with positive fluid wave.  I spoke to Dr. Holley Raring and nephrology who recommends patient be  admitted for hemodialysis and placement of a permacath in the absence of a working peritoneal dialysis catheter.  Patient agrees with plan and all questions were answered.  Dispo: Admit to medicine      ____________________________________________   FINAL CLINICAL IMPRESSION(S) / ED DIAGNOSES  Final diagnoses:  Mechanical complication due to peritoneal dialysis catheter, initial encounter [facility])     ED Discharge Orders     None        Note:  This document was prepared using Dragon voice recognition software and may include unintentional dictation errors.    Naaman Plummer, MD 11/01/20 1525

## 2020-11-01 NOTE — ED Notes (Signed)
Pt discussed with MD Paduchowski

## 2020-11-01 NOTE — H&P (Addendum)
History and Physical  Matthew [name] DOB: 06/12/1930 DOA: 11/01/2020  Referring physician: Dr. Cheri Fowler, [place]. PCP: Jodi Marble, MD  Outpatient Specialists: Nephrology. Patient coming from: Home, lives with his daughter who is also his medical power of attorney.    Chief Complaint: Peritoneal dialysis not working.  HPI: Matthew [name] is a 87 y.o. male with medical history significant for ESRD on PD, essential hypertension, HFrEF 45%, paroxysmal A. fib not on anticoagulation, coronary artery disease status post CABG, type 2 diabetes, hypothyroidism, aortic valve regurgitation and aortic thoracic aneurysm dissection (declines surgery), tricuspid valve regurgitation, anemia of chronic disease, hyperlipidemia, who presented to [facility] ED at the request of nephrology, Dr. Holley Raring, due to malfunctioning peritoneal dialysis.  PD was placed by general surgery at [facility] 2 and half years ago, had a malfunctioning event in March 2022.  The PD was fixed by same general surgery at [facility] in [place].  Patient was told that if this happens again they might have to remove the peritoneal dialysis.  The day prior to presentation, PD was not draining well, the daughter called his nephrology's office, was advised to stop peritoneal dialysis and to bring the patient to the hospital the following morning, which she did.  Patient has been at his baseline per his daughter at bedside.  Except for distended abdomen from PD fluid retention, no constitutional symptoms.  No abdominal pain.  No change in appetite.  Still makes urine and is on home Lasix.  Patient lives with his daughter, who is also his main caregiver and medical POA.  In the ED, vital signs and labs are stable.  Nephrology requested admission by hospitalist team to arrange for hemodialysis and placement of a permacath in the absence of a working peritoneal dialysis catheter.  [facility], hospitalist team, was asked to admit.  ED Course:   Temperature 98.4, BP 138/65, pulse 65, respiration rate 17, O2 saturation 100% on room air.  Lab studies remarkable for serum sodium 137, potassium 4.6, serum bicarb 29, glucose 158, BUN 50, creatinine 2.89, anion gap 6, GFR 20, alkaline phosphatase 142, ALT 54, AST 37, total bilirubin 0.6.  WBC 5.3, hemoglobin 12.2, platelet 121.  Review of Systems: Review of systems as noted in the HPI. All other systems reviewed and are negative.   Past Medical History:  Diagnosis Date   Anemia    Arthritis    Broken arm 05/2013   Left   CHF (congestive heart failure) (HCC)    Chronic kidney disease    Coronary artery disease    Diabetes mellitus without complication (HCC)    Dyspnea    DOE   Edema    FEET/LEGS OCCAS   Hypertension    Myocardial infarction ([place])    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   left frontal scalp, [facility]   Squamous cell carcinoma of skin 05/05/2020   left temporal scalp, in situ, [facility] 05/13/20   Past Surgical History:  Procedure Laterality Date   CATARACT EXTRACTION W/PHACO Left 07/26/2017   Procedure: CATARACT EXTRACTION PHACO AND INTRAOCULAR LENS PLACEMENT ([place]);  Surgeon: Birder Robson, MD;  Location: [facility] ORS;  Service: Ophthalmology;  Laterality: Left;  [place]   00:45.8 AP%  13.1 CDE  6.00 Fluid Pack Lot # [id number]   CATARACT EXTRACTION W/PHACO Right 08/17/2017   Procedure: CATARACT EXTRACTION PHACO AND INTRAOCULAR LENS PLACEMENT ([facility]);  Surgeon: Birder Robson, MD;  Location: [facility] ORS;  Service: Ophthalmology;  Laterality: Right;  [place]  01:30 AP%  16.8 CDE 15.24 Fluid pack lot # [id number]   CHOLECYSTECTOMY     CORONARY ANGIOPLASTY     STENTS   CORONARY ARTERY BYPASS GRAFT     DIALYSIS/PERMA CATHETER INSERTION N/A 02/09/2018   Procedure: DIALYSIS/PERMA CATHETER INSERTION;  Surgeon: Algernon Huxley, MD;  Location: [facility] CV LAB;  Service: Cardiovascular;  Laterality: N/A;   FRACTURE SURGERY     ARM   LEFT HEART CATHETERIZATION WITH CORONARY/GRAFT ANGIOGRAM  Left 12/04/2013   Procedure: LEFT HEART CATHETERIZATION WITH Beatrix Fetters;  Surgeon: Leonie Man, MD;  Location: [facility] CATH LAB;  Service: Cardiovascular;  Laterality: Left;   PERCUTANEOUS CORONARY STENT INTERVENTION (PCI-S) N/A 12/06/2013   Procedure: PERCUTANEOUS CORONARY STENT INTERVENTION (PCI-S);  Surgeon: Sinclair Grooms, MD;  Location: [facility] CATH LAB;  Service: Cardiovascular;  Laterality: N/A;   VEIN BYPASS SURGERY     [place] By[facility]     Social History:  reports that he has quit smoking. His smoking use included cigarettes. He has never used smokeless tobacco. He reports current alcohol use of about 1.0 standard drink of alcohol per week. He reports that he does not use drugs.   Allergies  Allergen Reactions   Amoxicillin Diarrhea    Family History  Problem Relation Age of Onset   Heart attack Father 1       died first MI at age 39   Heart failure Mother       Prior to Admission medications   Medication Sig Start Date End Date Taking? Authorizing Provider  amiodarone (PACERONE) 200 MG tablet Take 200 mg by mouth daily.    [provider]  Cholecalciferol (VITAMIN D3) 2000 UNITS TABS Take 2,000 Units by mouth daily with breakfast.    [provider]  ciprofloxacin (CIPRO) 500 MG tablet Take 500 mg by mouth 2 (two) times daily. As directed for cath.    [provider]  clopidogrel (PLAVIX) 75 MG tablet Take 1 tablet (75 mg total) by mouth daily with breakfast. 12/07/13   Almyra Deforest, PA  fenofibrate 160 MG tablet Take 160 mg by mouth daily.    [provider]  ferrous sulfate 325 (65 FE) MG tablet Take 325 mg by mouth 2 (two) times daily with a meal.     [provider]  fluticasone (FLONASE) 50 MCG/ACT nasal spray Place 2 sprays into both nostrils at bedtime.    [provider]  furosemide (LASIX) 40 MG tablet Take 2 tablets (80 mg total) by mouth 2 (two) times daily. Patient taking differently: Take 40  mg by mouth daily.  01/15/18   Saundra Shelling, MD  levocetirizine (XYZAL) 5 MG tablet Take 10 mg by mouth every morning.     [provider]  metoprolol succinate (TOPROL-XL) 50 MG 24 hr tablet Take 50 mg by mouth daily. At lunch time only if systolic BP is above 119. 05/09/19   [provider]  multivitamin (RENA-VIT) TABS tablet Take 1 tablet by mouth daily.    [provider]  nitroGLYCERIN (NITROSTAT) 0.4 MG SL tablet Place 0.4 mg under the tongue every 5 (five) minutes as needed for chest pain.    [provider]  Omega-3 Fatty Acids (FISH OIL) 1000 MG CAPS Take 4,000 mg by mouth daily at 12 noon.     [provider]  potassium chloride SA (K-DUR,KLOR-CON) 20 MEQ tablet Take 2 tablets (40 mEq total) by mouth daily. 01/16/18 05/24/19  Saundra Shelling, MD  PROAIR HFA 108 (  90 Base) MCG/ACT inhaler Inhale 2 puffs into the lungs every 6 (six) hours as needed. 09/20/17   [provider]  rosuvastatin (CRESTOR) 40 MG tablet Take 40 mg by mouth daily. 04/21/19   [provider]    Physical Exam: BP 138/65   Pulse 65   Temp 98.4 F (36.9 C) (Oral)   Resp 17   Ht 6' (1.829 m)   Wt 103.7 kg   SpO2 100%   BMI 31.00 kg/m   General: 87 y.o. year-old male well developed well nourished in no acute distress.  Alert and oriented x3. Cardiovascular: Regular rate and rhythm with no rubs or gallops.  No thyromegaly or JVD noted.  No lower extremity edema. 2/4 pulses in all 4 extremities. Respiratory: Clear to auscultation with no wheezes or rales. Good inspiratory effort. Abdomen: Soft nontender mildly distended with normal bowel sounds x4 quadrants. Muskuloskeletal: No cyanosis, clubbing or edema noted bilaterally Neuro: CN II-XII intact, strength, sensation, reflexes Skin: No ulcerative lesions noted or rashes Psychiatry: Judgement and insight appear normal. Mood is appropriate for condition and setting          Labs on Admission:  Basic  Metabolic Panel: Recent Labs  Lab 11/01/20 1129  NA 137  K 4.6  CL 102  CO2 29  GLUCOSE 158*  BUN 50*  CREATININE 2.89*  CALCIUM 8.9   Liver Function Tests: Recent Labs  Lab 11/01/20 1129  AST 37  ALT 54*  ALKPHOS 142*  BILITOT 0.6  PROT 6.4*  ALBUMIN 3.3*   No results for input(s): LIPASE, AMYLASE in the last 168 hours. No results for input(s): AMMONIA in the last 168 hours. CBC: Recent Labs  Lab 11/01/20 1129  WBC 5.3  NEUTROABS 3.0  HGB 12.2*  HCT 37.2*  MCV 97.9  PLT 121*   Cardiac Enzymes: No results for input(s): CKTOTAL, CKMB, CKMBINDEX, TROPONINI in the last 168 hours.  BNP (last 3 results) No results for input(s): BNP in the last 8760 hours.  ProBNP (last 3 results) No results for input(s): PROBNP in the last 8760 hours.  CBG: No results for input(s): GLUCAP in the last 168 hours.  Radiological Exams on Admission: No results found.  EKG: I independently viewed the EKG done and my findings are as followed: None available at the time of this visit.  Assessment/Plan Present on Admission:  Malposition of peritoneal dialysis catheter [facility])  Active Problems:   Malposition of peritoneal dialysis catheter (HCC)  Suspected malposition of peritoneal dialysis catheter, malfunctioning, POA. Presented to the ED at the request of nephrology Dr. Holley Raring due to malfunctioning peritoneal dialysis catheter. No reported constitutional symptoms prior to admission.   Currently afebrile with no leukocytosis.   No abdominal tenderness with palpation on exam. Vascular surgery consulted for possible permacath hemodialysis catheter placement on Monday, 11/03/2020.  Per nephrology they do not place line over the weekend. Nephrology consult for hemodialysis or PD if able. Electrolytes and volume status managed with dialysis. Renal carb modified diet with fluid restriction less than 1200 cc/day Defer to nephrology to arrange for possible HD spot outpatient if he will  no longer be on PD.  ESRD on PD Management as stated above. Daily renal function panel. Rest of management per nephrology. Nephrology, Dr. Holley Raring, patient's nephrologist, consulted.  HFrEF 45% Euvolemic on exam Resume home regimen. Volume status managed with hemodialysis.  He is on p.o. Lasix and Toprol-XL. Monitor daily strict I's and O's and daily weight  Coronary artery disease status  post CABG Denies any anginal symptoms Resume home regimen, on Plavix and Crestor.  Aortic regurgitation/aortic thoracic aneurysm dissection Has declined surgery Resume home Lasix, he still makes urine. Maintain BP normotensive IV antihypertensives with parameters to maintain SBP less than 130. Monitor closely on telemetry.  Paroxysmal A. fib, not on anticoagulation On amiodarone and on Toprol-XL In sinus rhythm at the time of this visit Monitor on telemetry  Essential hypertension BP is currently stable Resume home regimen. Monitor vital signs.  Hyperlipidemia Resume home regimen.  Hypothyroidism Resume home levothyroxine.  Type 2 diabetes with hyperglycemia Obtain hemoglobin A1c Start insulin sliding scale Avoid overcorrection in the setting of ESRD and hypoglycemia.    DVT prophylaxis: Subcu heparin 3 times daily.  Code Status: DNR stated by the patient and his daughter at bedside.  Family Communication: Daughter who is also medical power of attorney present at bedside.  Disposition Plan: Admit to MedSurg unit with remote telemetry.  Consults called: Nephrology, vascular surgery.  Admission status: Inpatient status.  Patient will require at least 2 midnights for further evaluation and treatment of present condition.   Status is: Inpatient    Dispo:  Patient From: Home  Planned Disposition: Home, possibly on 11/03/2020 or when nephrology and vascular surgery sign off.  Medically stable for discharge: No         Kayleen Memos MD [facility] Pager  [phone]  If 7PM-7AM, please contact night-coverage [email or web address] Password [facility]  11/01/2020, 3:57 PM

## 2020-11-01 NOTE — ED Triage Notes (Addendum)
Pt to ER via POV with c/o of his peritoneal dialysis catheter not working.   Pt reports being able to drain approx 2053ml last night. Pt was advised by [facility] to stop session after it stopped draining. Last full session completed was 10/30/20. Pt reports abdominal swelling. Daughter reports approx 10lbs weight gain over night.   Reports feeling well, denies pain or other complaints.

## 2020-11-01 NOTE — ED Notes (Signed)
Lab called to obtain labs

## 2020-11-01 NOTE — ED Notes (Signed)
Pt's daughter Horris [phone]

## 2020-11-02 ENCOUNTER — Encounter: Payer: Self-pay | Admitting: Internal Medicine

## 2020-11-02 DIAGNOSIS — E785 Hyperlipidemia, unspecified: Secondary | ICD-10-CM

## 2020-11-02 DIAGNOSIS — I4821 Permanent atrial fibrillation: Secondary | ICD-10-CM

## 2020-11-02 DIAGNOSIS — E1169 Type 2 diabetes mellitus with other specified complication: Secondary | ICD-10-CM

## 2020-11-02 DIAGNOSIS — I10 Essential (primary) hypertension: Secondary | ICD-10-CM

## 2020-11-02 LAB — CBC
HCT: 36 % — ABNORMAL LOW (ref 39.0–52.0)
Hemoglobin: 11.8 g/dL — ABNORMAL LOW (ref 13.0–17.0)
MCH: 31.6 pg (ref 26.0–34.0)
MCHC: 32.8 g/dL (ref 30.0–36.0)
MCV: 96.5 fL (ref 80.0–100.0)
Platelets: 113 10*3/uL — ABNORMAL LOW (ref 150–400)
RBC: 3.73 MIL/uL — ABNORMAL LOW (ref 4.22–5.81)
RDW: 13.5 % (ref 11.5–15.5)
WBC: 4.7 10*3/uL (ref 4.0–10.5)
nRBC: 0 % (ref 0.0–0.2)

## 2020-11-02 LAB — GLUCOSE, CAPILLARY
Glucose-Capillary: 123 mg/dL — ABNORMAL HIGH (ref 70–99)
Glucose-Capillary: 166 mg/dL — ABNORMAL HIGH (ref 70–99)
Glucose-Capillary: 123 mg/dL — ABNORMAL HIGH (ref 70–99)
Glucose-Capillary: 142 mg/dL — ABNORMAL HIGH (ref 70–99)

## 2020-11-02 LAB — RENAL FUNCTION PANEL
Albumin: 3.2 g/dL — ABNORMAL LOW (ref 3.5–5.0)
Anion gap: 5 (ref 5–15)
BUN: 50 mg/dL — ABNORMAL HIGH (ref 8–23)
CO2: 29 mmol/L (ref 22–32)
Calcium: 8.9 mg/dL (ref 8.9–10.3)
Chloride: 104 mmol/L (ref 98–111)
Creatinine, Ser: 2.88 mg/dL — ABNORMAL HIGH (ref 0.61–1.24)
GFR, Estimated: 20 mL/min — ABNORMAL LOW (ref >60)
Glucose, Bld: 148 mg/dL — ABNORMAL HIGH (ref 70–99)
Phosphorus: 4.3 mg/dL (ref 2.5–4.6)
Potassium: 4.5 mmol/L (ref 3.5–5.1)
Sodium: 138 mmol/L (ref 135–145)

## 2020-11-02 MED ORDER — CHLORHEXIDINE GLUCONATE CLOTH 2 % EX PADS
6.0000 | MEDICATED_PAD | Freq: Every day | CUTANEOUS | Status: DC
  Administered 2020-11-02 – 2020-11-04 (×3): 6 via TOPICAL

## 2020-11-02 NOTE — Progress Notes (Signed)
[facility] [place] NAME: Matthew [name]    MR#:  [id number]  DATE OF BIRTH:  May 09, 1931  SUBJECTIVE:  CHIEF COMPLAINT:   Chief Complaint  Patient presents with   Peritoneal Diaylsis Catheter Problem  Patient denies any complaint.  Daughter at bedside.  She reports good response to Lasix since admission.  Very pleasant.  Would like to speak with Dr. Holley Raring REVIEW OF SYSTEMS:  Review of Systems  Constitutional:  Negative for diaphoresis, fever, malaise/fatigue and weight loss.  HENT:  Negative for ear discharge, ear pain, hearing loss, nosebleeds, sore throat and tinnitus.   Eyes:  Negative for blurred vision and pain.  Respiratory:  Negative for cough, hemoptysis, shortness of breath and wheezing.   Cardiovascular:  Negative for chest pain, palpitations, orthopnea and leg swelling.  Gastrointestinal:  Negative for abdominal pain, blood in stool, constipation, diarrhea, heartburn, nausea and vomiting.  Genitourinary:  Negative for dysuria, frequency and urgency.  Musculoskeletal:  Negative for back pain and myalgias.  Skin:  Negative for itching and rash.  Neurological:  Negative for dizziness, tingling, tremors, focal weakness, seizures, weakness and headaches.  Psychiatric/Behavioral:  Negative for depression. The patient is not nervous/anxious.   DRUG ALLERGIES:   Allergies  Allergen Reactions   Amoxicillin Diarrhea   VITALS:  Blood pressure (!) 131/59, pulse (!) 57, temperature 97.8 F (36.6 C), temperature source Oral, resp. rate 17, height 6' (1.829 m), weight 102.4 kg, SpO2 99 %. PHYSICAL EXAMINATION:  Physical Exam 87 year old male lying in the bed comfortably without any acute distress Lungs clear to auscultation bilaterally no wheezing rales rhonchi or crepitation Cardiovascular S1-S2 normal, no murmur rales or gallop Abdomen distended Neuro alert and oriented, nonfocal exam Skin no rash or lesion Psych normal mood and  affect LABORATORY PANEL:  Male CBC Recent Labs  Lab 11/02/20 0440  WBC 4.7  HGB 11.8*  HCT 36.0*  PLT 113*   ------------------------------------------------------------------------------------------------------------------ Chemistries  Recent Labs  Lab 11/01/20 1129 11/01/20 1803 11/02/20 0440  NA 137  --  138  K 4.6  --  4.5  CL 102  --  104  CO2 29  --  29  GLUCOSE 158*  --  148*  BUN 50*  --  50*  CREATININE 2.89*   < > 2.88*  CALCIUM 8.9  --  8.9  AST 37  --   --   ALT 54*  --   --   ALKPHOS 142*  --   --   BILITOT 0.6  --   --    < > = values in this interval not displayed.   RADIOLOGY:  No results found. ASSESSMENT AND PLAN:  87 y.o. male with medical history significant for ESRD on PD, essential hypertension, HFrEF 45%, paroxysmal A. fib not on anticoagulation, coronary artery disease status post CABG, type 2 diabetes, hypothyroidism, aortic valve regurgitation and aortic thoracic aneurysm dissection (declines surgery), tricuspid valve regurgitation, anemia of chronic disease, hyperlipidemia admitted at the request of nephrology, Dr. Holley Raring, due to malfunctioning peritoneal dialysis  Suspected malposition of peritoneal dialysis catheter, malfunctioning, POA. Admitted at the request of nephrology Dr. Holley Raring due to malfunctioning peritoneal dialysis catheter. No reported constitutional symptoms prior to admission.   Currently afebrile with no leukocytosis.   No abdominal tenderness with palpation on exam. Vascular surgery consulted for possible permacath hemodialysis catheter placement on Monday, 11/03/2020.  Per nephrology they do not place line over the weekend. Nephrology consult  for hemodialysis or PD if able.  Dr. Holley Raring aware Electrolytes and volume status managed with dialysis. Renal carb modified diet with fluid restriction less than 1200 cc/day Defer to nephrology to arrange for possible HD spot outpatient if he will no longer be on PD.   ESRD on  PD Management as stated above. Daily renal function panel. Rest of management per nephrology. Nephrology, Dr. Holley Raring, patient's nephrologist aware   HFrEF 45% Euvolemic on exam Resume home regimen. Volume status managed with hemodialysis. Continue p.o. Lasix and Toprol-XL. Monitor daily strict I's and O's and daily weight Net IO Since Admission: -1,170 mL [11/02/20 1138]    Coronary artery disease status post CABG Denies any anginal symptoms Resume home regimen, on Plavix and Crestor.   Aortic regurgitation/aortic thoracic aneurysm dissection Has declined surgery Resume home Lasix, he still makes urine. Maintain BP normotensive IV antihypertensives with parameters to maintain SBP less than 130. Monitor closely on telemetry.   Paroxysmal A. fib, not on anticoagulation On amiodarone.  Stop Toprol-XL as heart rate in 50s In sinus rhythm Monitor on telemetry   Essential hypertension BP is currently stable Monitor vital signs.   Hyperlipidemia Continue Crestor   Hypothyroidism Continue home levothyroxine.   Type 2 diabetes with hyperglycemia Continue insulin sliding scale Avoid overcorrection in the setting of ESRD and hypoglycemia.  Body mass index is 30.62 kg/m.  Net IO Since Admission: -1,170 mL [11/02/20 1126]      Status is: Inpatient  Remains inpatient appropriate because:Ongoing diagnostic testing needed not appropriate for outpatient work up  Dispo:  Patient From: Home  Planned Disposition: Home  Medically stable for discharge: No     DVT prophylaxis:       heparin injection 5,000 Units Start: 11/01/20 1600 SCDs Start: 11/01/20 1555     Family Communication: (Daughter updated at bedside on 6/12)   All the records are reviewed and case discussed with Care Management/Social Worker. Management plans discussed with the patient, family and they are in agreement.  CODE STATUS: DNR Level of care: Med-Surg  TOTAL TIME TAKING CARE OF THIS PATIENT:  35 minutes.   More than 50% of the time was spent in counseling/coordination of care: YES  POSSIBLE D/C IN 1-2 DAYS, DEPENDING ON CLINICAL CONDITION.  And vascular surgery requiring new dialysis catheter placement followed by need for dialysis   Max Sane M.D on 11/02/2020 at 11:26 AM  [facility]   CC: Primary care physician; Jodi Marble, MD  Note: This dictation was prepared with Dragon dictation along with smaller phrase technology. Any transcriptional errors that result from this process are unintentional.

## 2020-11-02 NOTE — H&P (View-Only) (Signed)
Reason for Consult:Permacath Placement and removal of PD cath. Referring Physician: Dr. Carlynn Spry  Matthew [name] is an 87 y.o. male.  HPI: This is a 87 year old male with ESRD and malfunctioning PD catheter.  We have been consulted for removal of PD catheter and placement of permacath.  Past Medical History:  Diagnosis Date   Anemia    Arthritis    Broken arm 05/2013   Left   CHF (congestive heart failure) (HCC)    Chronic kidney disease    Coronary artery disease    Diabetes mellitus without complication (HCC)    Dyspnea    DOE   Edema    FEET/LEGS OCCAS   Hypertension    Myocardial infarction ([place])    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   left frontal scalp, [facility]   Squamous cell carcinoma of skin 05/05/2020   left temporal scalp, in situ, [facility] 05/13/20    Past Surgical History:  Procedure Laterality Date   CATARACT EXTRACTION W/PHACO Left 07/26/2017   Procedure: CATARACT EXTRACTION PHACO AND INTRAOCULAR LENS PLACEMENT ([place]);  Surgeon: Birder Robson, MD;  Location: [facility] ORS;  Service: Ophthalmology;  Laterality: Left;  [place]   00:45.8 AP%  13.1 CDE  6.00 Fluid Pack Lot # [id number]   CATARACT EXTRACTION W/PHACO Right 08/17/2017   Procedure: CATARACT EXTRACTION PHACO AND INTRAOCULAR LENS PLACEMENT ([facility]);  Surgeon: Birder Robson, MD;  Location: [facility] ORS;  Service: Ophthalmology;  Laterality: Right;  [place]  01:30 AP% 16.8 CDE 15.24 Fluid pack lot # [id number]   CHOLECYSTECTOMY     CORONARY ANGIOPLASTY     STENTS   CORONARY ARTERY BYPASS GRAFT     DIALYSIS/PERMA CATHETER INSERTION N/A 02/09/2018   Procedure: DIALYSIS/PERMA CATHETER INSERTION;  Surgeon: Algernon Huxley, MD;  Location: [facility] LAB;  Service: Cardiovascular;  Laterality: N/A;   FRACTURE SURGERY     ARM   LEFT HEART CATHETERIZATION WITH CORONARY/GRAFT ANGIOGRAM Left 12/04/2013   Procedure: LEFT HEART CATHETERIZATION WITH Beatrix Fetters;  Surgeon: Leonie Man, MD;  Location: [facility] CATH LAB;   Service: Cardiovascular;  Laterality: Left;   PERCUTANEOUS CORONARY STENT INTERVENTION (PCI-S) N/A 12/06/2013   Procedure: PERCUTANEOUS CORONARY STENT INTERVENTION (PCI-S);  Surgeon: Sinclair Grooms, MD;  Location: [facility] CATH LAB;  Service: Cardiovascular;  Laterality: N/A;   VEIN BYPASS SURGERY     [place] Bypass Surgery     Family History  Problem Relation Age of Onset   Heart attack Father 36       died first MI at age 24   Heart failure Mother     Social History:  reports that he has quit smoking. His smoking use included cigarettes. He has never used smokeless tobacco. He reports current alcohol use of about 1.0 standard drink of alcohol per week. He reports that he does not use drugs.  Allergies:  Allergies  Allergen Reactions   Amoxicillin Diarrhea    Medications: I have reviewed the patient's current medications.  Results for orders placed or performed during the hospital encounter of 11/01/20 (from the past 48 hour(s))  CBC with Differential     Status: Abnormal   Collection Time: 11/01/20 11:29 AM  Result Value Ref Range   WBC 5.3 4.0 - 10.5 K/uL   RBC 3.80 (L) 4.22 - 5.81 MIL/uL   Hemoglobin 12.2 (L) 13.0 - 17.0 g/dL   HCT 37.2 (L) 39.0 - 52.0 %   MCV 97.9 80.0 - 100.0 fL   MCH 32.1  26.0 - 34.0 pg   MCHC 32.8 30.0 - 36.0 g/dL   RDW 13.8 11.5 - 15.5 %   Platelets 121 (L) 150 - 400 K/uL   nRBC 0.0 0.0 - 0.2 %   Neutrophils Relative % 57 %   Neutro Abs 3.0 1.7 - 7.7 K/uL   Lymphocytes Relative 14 %   Lymphs Abs 0.7 0.7 - 4.0 K/uL   Monocytes Relative 14 %   Monocytes Absolute 0.7 0.1 - 1.0 K/uL   Eosinophils Relative 14 %   Eosinophils Absolute 0.8 (H) 0.0 - 0.5 K/uL   Basophils Relative 1 %   Basophils Absolute 0.1 0.0 - 0.1 K/uL   Immature Granulocytes 0 %   Abs Immature Granulocytes 0.02 0.00 - 0.07 K/uL    Comment: Performed at [facility], [address] [place], [place] [address]  Comprehensive metabolic panel     Status: Abnormal    Collection Time: 11/01/20 11:29 AM  Result Value Ref Range   Sodium 137 135 - 145 mmol/L   Potassium 4.6 3.5 - 5.1 mmol/L   Chloride 102 98 - 111 mmol/L   CO2 29 22 - 32 mmol/L   Glucose, Bld 158 (H) 70 - 99 mg/dL    Comment: Glucose reference range applies only to samples taken after fasting for at least 8 hours.   BUN 50 (H) 8 - 23 mg/dL   Creatinine, Ser 2.89 (H) 0.61 - 1.24 mg/dL   Calcium 8.9 8.9 - 10.3 mg/dL   Total Protein 6.4 (L) 6.5 - 8.1 g/dL   Albumin 3.3 (L) 3.5 - 5.0 g/dL   AST 37 15 - 41 U/L   ALT 54 (H) 0 - 44 U/L   Alkaline Phosphatase 142 (H) 38 - 126 U/L   Total Bilirubin 0.6 0.3 - 1.2 mg/dL   GFR, Estimated 20 (L) >60 mL/min    Comment: (NOTE) Calculated using the CKD-EPI Creatinine Equation (2021)    Anion gap 6 5 - 15    Comment: Performed at [facility], [address] [place], [place] [address]  Resp Panel by RT-PCR (Flu A&B, Covid) Nasopharyngeal Swab     Status: None   Collection Time: 11/01/20  3:43 PM   Specimen: Nasopharyngeal Swab; Nasopharyngeal(NP) swabs in vial transport medium  Result Value Ref Range   SARS Coronavirus 2 by RT PCR NEGATIVE NEGATIVE    Comment: (NOTE) SARS-CoV-2 target nucleic acids are NOT DETECTED.  The SARS-CoV-2 RNA is generally detectable in upper respiratory specimens during the acute phase of infection. The lowest concentration of SARS-CoV-2 viral copies this assay can detect is 138 copies/mL. A negative result does not preclude SARS-Cov-2 infection and should not be used as the sole basis for treatment or other patient management decisions. A negative result may occur with  improper specimen collection/handling, submission of specimen other than nasopharyngeal swab, presence of viral mutation(s) within the areas targeted by this assay, and inadequate number of viral copies(<138 copies/mL). A negative result must be combined with clinical observations, patient history, and epidemiological information. The  expected result is Negative.  Fact Sheet for Patients:  EntrepreneurPulse.com.au  Fact Sheet for Healthcare Providers:  IncredibleEmployment.be  This test is no t yet approved or cleared by the [place] FDA and  has been authorized for detection and/or diagnosis of SARS-CoV-2 by FDA under an Emergency Use Authorization (EUA). This EUA will remain  in effect (meaning this test can be used) for the duration of the COVID-19 declaration under Section  564(b)(1) of the Act, 21 U.S.C.section 360bbb-3(b)(1), unless the authorization is terminated  or revoked sooner.       Influenza A by PCR NEGATIVE NEGATIVE   Influenza B by PCR NEGATIVE NEGATIVE    Comment: (NOTE) The Xpert Xpress SARS-CoV-2/FLU/RSV plus assay is intended as an aid in the diagnosis of influenza from Nasopharyngeal swab specimens and should not be used as a sole basis for treatment. Nasal washings and aspirates are unacceptable for Xpert Xpress SARS-CoV-2/FLU/RSV testing.  Fact Sheet for Patients: EntrepreneurPulse.com.au  Fact Sheet for Healthcare Providers: IncredibleEmployment.be  This test is not yet approved or cleared by the [place] FDA and has been authorized for detection and/or diagnosis of SARS-CoV-2 by FDA under an Emergency Use Authorization (EUA). This EUA will remain in effect (meaning this test can be used) for the duration of the COVID-19 declaration under Section 564(b)(1) of the Act, 21 U.S.C. section 360bbb-3(b)(1), unless the authorization is terminated or revoked.  Performed at [facility], [address] [place], [place] [address]   Creatinine, serum     Status: Abnormal   Collection Time: 11/01/20  6:03 PM  Result Value Ref Range   Creatinine, Ser 2.91 (H) 0.61 - 1.24 mg/dL   GFR, Estimated 20 (L) >60 mL/min    Comment: (NOTE) Calculated using the CKD-EPI Creatinine Equation (2021) Performed  at [facility], [address] [place], [place] [address]   Glucose, capillary     Status: Abnormal   Collection Time: 11/01/20 11:03 PM  Result Value Ref Range   Glucose-Capillary 157 (H) 70 - 99 mg/dL    Comment: Glucose reference range applies only to samples taken after fasting for at least 8 hours.  Renal function panel     Status: Abnormal   Collection Time: 11/02/20  4:40 AM  Result Value Ref Range   Sodium 138 135 - 145 mmol/L   Potassium 4.5 3.5 - 5.1 mmol/L   Chloride 104 98 - 111 mmol/L   CO2 29 22 - 32 mmol/L   Glucose, Bld 148 (H) 70 - 99 mg/dL    Comment: Glucose reference range applies only to samples taken after fasting for at least 8 hours.   BUN 50 (H) 8 - 23 mg/dL   Creatinine, Ser 2.88 (H) 0.61 - 1.24 mg/dL   Calcium 8.9 8.9 - 10.3 mg/dL   Phosphorus 4.3 2.5 - 4.6 mg/dL   Albumin 3.2 (L) 3.5 - 5.0 g/dL   GFR, Estimated 20 (L) >60 mL/min    Comment: (NOTE) Calculated using the CKD-EPI Creatinine Equation (2021)    Anion gap 5 5 - 15    Comment: Performed at [facility], [address] [place], [place] [address]  CBC     Status: Abnormal   Collection Time: 11/02/20  4:40 AM  Result Value Ref Range   WBC 4.7 4.0 - 10.5 K/uL   RBC 3.73 (L) 4.22 - 5.81 MIL/uL   Hemoglobin 11.8 (L) 13.0 - 17.0 g/dL   HCT 36.0 (L) 39.0 - 52.0 %   MCV 96.5 80.0 - 100.0 fL   MCH 31.6 26.0 - 34.0 pg   MCHC 32.8 30.0 - 36.0 g/dL   RDW 13.5 11.5 - 15.5 %   Platelets 113 (L) 150 - 400 K/uL    Comment: Immature Platelet Fraction may be clinically indicated, consider ordering this additional test [id number]    nRBC 0.0 0.0 - 0.2 %    Comment: Performed at [facility], [address] [place], [place] [address]  Glucose, capillary     Status: Abnormal   Collection Time: 11/02/20  7:49 AM  Result Value Ref Range   Glucose-Capillary 123 (H) 70 - 99 mg/dL    Comment: Glucose reference range applies only to samples taken after fasting for at least 8  hours.   Comment 1 Notify RN    Comment 2 Document in Chart   Glucose, capillary     Status: Abnormal   Collection Time: 11/02/20 11:42 AM  Result Value Ref Range   Glucose-Capillary 166 (H) 70 - 99 mg/dL    Comment: Glucose reference range applies only to samples taken after fasting for at least 8 hours.    No results found.  Review of Systems  All other systems reviewed and are negative. Blood pressure 132/64, pulse (!) 57, temperature 98.1 F (36.7 C), temperature source Oral, resp. rate 18, height 6' (1.829 m), weight 102.4 kg, SpO2 99 %. Physical Exam Vitals and nursing note reviewed.  Constitutional:      Appearance: Normal appearance. He is normal weight.  HENT:     Head: Normocephalic and atraumatic.     Nose: Nose normal.  Eyes:     Extraocular Movements: Extraocular movements intact.     Conjunctiva/sclera: Conjunctivae normal.     Pupils: Pupils are equal, round, and reactive to light.  Cardiovascular:     Rate and Rhythm: Normal rate and regular rhythm.  Pulmonary:     Effort: Pulmonary effort is normal.     Breath sounds: Normal breath sounds.  Abdominal:     General: Abdomen is flat. Bowel sounds are normal.     Palpations: Abdomen is soft.  Musculoskeletal:        General: Normal range of motion.     Cervical back: Normal range of motion.  Skin:    General: Skin is warm and dry.     Capillary Refill: Capillary refill takes less than 2 seconds.  Neurological:     General: No focal deficit present.     Mental Status: He is alert and oriented to person, place, and time.  Psychiatric:        Mood and Affect: Mood normal.        Behavior: Behavior normal.        Thought Content: Thought content normal.        Judgment: Judgment normal.    Assessment/Plan: Will put on the schedule for removal of PD catheter and placement of permacath by Dr. [name] in the OR. NPO after midnight Cardiac clearance  Elmore Guise 11/02/2020, 3:25 PM

## 2020-11-02 NOTE — Plan of Care (Signed)

## 2020-11-02 NOTE — Progress Notes (Addendum)
[facility]  ROUNDING NOTE   Subjective:  Mr. Barella is a 87 years old male . He has past  medical history significant for ESRD on PD, essential hypertension,A. fib ,coronary artery disease status post CABG, type 2 diabetes, hypothyroidism, aortic valve regurgitation and aortic thoracic aneurysm dissection, tricuspid valve regurgitation, anemia of chronic disease, hyperlipidemia. He presented to the  ED as advised by Dr. Holley Raring , due to malfunctioning peritoneal dialysis Patient found resting in bed, with family member at the bedside.He denies SOB, nausea, vomiting, able to consume his breakfast with fair appetite. His PD catheter site with dressing clean, dry and intact. He reports that he gained weight, about 12 pounds in 24 hours, but since he received Lasix, he lost about 5-6 lbs.  Objective:  Vital signs in last 24 hours:  Temp:  [97.6 F (36.4 C)-98.4 F (36.9 C)] 97.8 F (36.6 C) (06/12 0820) Pulse Rate:  [57-70] 57 (06/12 0953) Resp:  [14-18] 17 (06/12 0820) BP: (127-157)/(58-90) 131/59 (06/12 0820) SpO2:  [97 %-100 %] 99 % (06/12 0820) Weight:  [102.4 kg] 102.4 kg (06/12 0500)  Weight change:  Filed Weights   11/01/20 1115 11/02/20 0500  Weight: 103.7 kg 102.4 kg    Intake/Output: I/O last 3 completed shifts: In: 240 [P.O.:240] Out: 3976 [Urine:1650]   Intake/Output this shift:  Total I/O In: 240 [P.O.:240] Out: -   Physical Exam: General: No acute distress  Head: Normocephalic, atraumatic. Moist oral mucosal membranes  Eyes: Anicteric  Neck: Supple, trachea midline  Lungs:  Clear to auscultation bilaterally, Normal and symmetrical respiratory effort  Heart: Regular rate and rhythm  Abdomen:  Soft, nontender,slightly distended   Extremities:  Trace peripheral edema.  Neurologic: Awake, alert,oriented, moving all four extremities  Skin: No acute lesions or rashes  Access: PD site with dressing CDI    Basic Metabolic Panel: Recent Labs  Lab  11/01/20 1129 11/01/20 1803 11/02/20 0440  NA 137  --  138  K 4.6  --  4.5  CL 102  --  104  CO2 29  --  29  GLUCOSE 158*  --  148*  BUN 50*  --  50*  CREATININE 2.89* 2.91* 2.88*  CALCIUM 8.9  --  8.9  PHOS  --   --  4.3    Liver Function Tests: Recent Labs  Lab 11/01/20 1129 11/02/20 0440  AST 37  --   ALT 54*  --   ALKPHOS 142*  --   BILITOT 0.6  --   PROT 6.4*  --   ALBUMIN 3.3* 3.2*   No results for input(s): LIPASE, AMYLASE in the last 168 hours. No results for input(s): AMMONIA in the last 168 hours.  CBC: Recent Labs  Lab 11/01/20 1129 11/02/20 0440  WBC 5.3 4.7  NEUTROABS 3.0  --   HGB 12.2* 11.8*  HCT 37.2* 36.0*  MCV 97.9 96.5  PLT 121* 113*    Cardiac Enzymes: No results for input(s): CKTOTAL, CKMB, CKMBINDEX, TROPONINI in the last 168 hours.  BNP: Invalid input(s): POCBNP  CBG: Recent Labs  Lab 11/01/20 2303 11/02/20 0749  GLUCAP 157* 123*    Microbiology: Results for orders placed or performed during the hospital encounter of 11/01/20  Resp Panel by RT-PCR (Flu A&B, Covid) Nasopharyngeal Swab     Status: None   Collection Time: 11/01/20  3:43 PM   Specimen: Nasopharyngeal Swab; Nasopharyngeal(NP) swabs in vial transport medium  Result Value Ref Range Status   SARS Coronavirus 2  by RT PCR NEGATIVE NEGATIVE Final    Comment: (NOTE) SARS-CoV-2 target nucleic acids are NOT DETECTED.  The SARS-CoV-2 RNA is generally detectable in upper respiratory specimens during the acute phase of infection. The lowest concentration of SARS-CoV-2 viral copies this assay can detect is 138 copies/mL. A negative result does not preclude SARS-Cov-2 infection and should not be used as the sole basis for treatment or other patient management decisions. A negative result may occur with  improper specimen collection/handling, submission of specimen other than nasopharyngeal swab, presence of viral mutation(s) within the areas targeted by this assay, and  inadequate number of viral copies(<138 copies/mL). A negative result must be combined with clinical observations, patient history, and epidemiological information. The expected result is Negative.  Fact Sheet for Patients:  EntrepreneurPulse.com.au  Fact Sheet for Healthcare Providers:  IncredibleEmployment.be  This test is no t yet approved or cleared by the [place] FDA and  has been authorized for detection and/or diagnosis of SARS-CoV-2 by FDA under an Emergency Use Authorization (EUA). This EUA will remain  in effect (meaning this test can be used) for the duration of the COVID-19 declaration under Section 564(b)(1) of the Act, 21 U.S.C.section 360bbb-3(b)(1), unless the authorization is terminated  or revoked sooner.       Influenza A by PCR NEGATIVE NEGATIVE Final   Influenza B by PCR NEGATIVE NEGATIVE Final    Comment: (NOTE) The Xpert Xpress SARS-CoV-2/FLU/RSV plus assay is intended as an aid in the diagnosis of influenza from Nasopharyngeal swab specimens and should not be used as a sole basis for treatment. Nasal washings and aspirates are unacceptable for Xpert Xpress SARS-CoV-2/FLU/RSV testing.  Fact Sheet for Patients: EntrepreneurPulse.com.au  Fact Sheet for Healthcare Providers: IncredibleEmployment.be  This test is not yet approved or cleared by the [place] FDA and has been authorized for detection and/or diagnosis of SARS-CoV-2 by FDA under an Emergency Use Authorization (EUA). This EUA will remain in effect (meaning this test can be used) for the duration of the COVID-19 declaration under Section 564(b)(1) of the Act, 21 U.S.C. section 360bbb-3(b)(1), unless the authorization is terminated or revoked.  Performed at [facility], [address] [place], [place] [address]     Coagulation Studies: No results for input(s): LABPROT, INR in the last 72  hours.  Urinalysis: No results for input(s): COLORURINE, LABSPEC, PHURINE, GLUCOSEU, HGBUR, BILIRUBINUR, KETONESUR, PROTEINUR, UROBILINOGEN, NITRITE, LEUKOCYTESUR in the last 72 hours.  Invalid input(s): APPERANCEUR    Imaging: No results found.   Medications:     amiodarone  200 mg Oral Daily   Chlorhexidine Gluconate Cloth  6 each Topical Daily   clopidogrel  75 mg Oral Q breakfast   ferrous sulfate  325 mg Oral BID WC   furosemide  80 mg Oral BID   heparin  5,000 Units Subcutaneous Q8H   insulin aspart  0-6 Units Subcutaneous TID WC   levothyroxine  100 mcg Oral Q0600   metoprolol succinate  12.5 mg Oral Daily   rosuvastatin  40 mg Oral Daily   acetaminophen, labetalol, melatonin, ondansetron (ZOFRAN) IV  Assessment/ Plan:  Mr. ASIF MUCHOW is a 87 y.o.  male with past  medical history significant for ESRD on PD, essential hypertension,A. fib ,coronary artery disease status post CABG, type 2 diabetes, hypothyroidism, aortic valve regurgitation and aortic thoracic aneurysm dissection, tricuspid valve regurgitation, anemia of chronic disease, hyperlipidemia.He presented to the  ED as advised by Dr. Holley Raring , due to malfunctioning peritoneal dialysis.  ESRD on PD Patient came into the hospital due to PD catheter malfunctioning He is receiving Furosemide PO 80 mg BID, with adequate urine output Volume and electrolyte status acceptable   Lab Results  Component Value Date   CREATININE 2.88 (H) 11/02/2020   CREATININE 2.91 (H) 11/01/2020   CREATININE 2.89 (H) 11/01/2020    Intake/Output Summary (Last 24 hours) at 11/02/2020 1128 Last data filed at 11/02/2020 1027 Gross per 24 hour  Intake 480 ml  Output 1650 ml  Net -1170 ml    #Malfunctioning of the PD catheter Planning to switch patient to HD,will plan for HD access placement tomorrow Dr.Lateef discussed with patient and family about switching to home hemodialysis and possibly taking the PD catheter  out.  #Hypertension with CKD BP readings within acceptable range today Continue current antihypertensive regimen  #Diabetes with CKD Patient is on Insulin Aspart Continue management per primary team  #Anemia of CKD Lab Results  Component Value Date   HGB 11.8 (L) 11/02/2020   He is on Ferrous Sulfate orally    LOS: 1 Angelin Cutrone 6/12/202211:28 AM

## 2020-11-02 NOTE — Consult Note (Signed)
Reason for Consult:Permacath Placement and removal of PD cath. Referring Physician: Dr. Carlynn Spry  ALASTER ASFAW is an 87 y.o. male.  HPI: This is a 87 year old male with ESRD and malfunctioning PD catheter.  We have been consulted for removal of PD catheter and placement of permacath.  Past Medical History:  Diagnosis Date   Anemia    Arthritis    Broken arm 05/2013   Left   CHF (congestive heart failure) (HCC)    Chronic kidney disease    Coronary artery disease    Diabetes mellitus without complication (HCC)    Dyspnea    DOE   Edema    FEET/LEGS OCCAS   Hypertension    Myocardial infarction ([place])    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   left frontal scalp, [facility]   Squamous cell carcinoma of skin 05/05/2020   left temporal scalp, in situ, [facility] 05/13/20    Past Surgical History:  Procedure Laterality Date   CATARACT EXTRACTION W/PHACO Left 07/26/2017   Procedure: CATARACT EXTRACTION PHACO AND INTRAOCULAR LENS PLACEMENT ([place]);  Surgeon: Birder Robson, MD;  Location: [facility] ORS;  Service: Ophthalmology;  Laterality: Left;  [place]   00:45.8 AP%  13.1 CDE  6.00 Fluid Pack Lot # [id number]   CATARACT EXTRACTION W/PHACO Right 08/17/2017   Procedure: CATARACT EXTRACTION PHACO AND INTRAOCULAR LENS PLACEMENT ([facility]);  Surgeon: Birder Robson, MD;  Location: [facility] ORS;  Service: Ophthalmology;  Laterality: Right;  [place]  01:30 AP% 16.8 CDE 15.24 Fluid pack lot # [id number]   CHOLECYSTECTOMY     CORONARY ANGIOPLASTY     STENTS   CORONARY ARTERY BYPASS GRAFT     DIALYSIS/PERMA CATHETER INSERTION N/A 02/09/2018   Procedure: DIALYSIS/PERMA CATHETER INSERTION;  Surgeon: Algernon Huxley, MD;  Location: [place] CV LAB;  Service: Cardiovascular;  Laterality: N/A;   FRACTURE SURGERY     ARM   LEFT HEART CATHETERIZATION WITH CORONARY/GRAFT ANGIOGRAM Left 12/04/2013   Procedure: LEFT HEART CATHETERIZATION WITH Beatrix Fetters;  Surgeon: Leonie Man, MD;  Location: [facility] CATH LAB;   Service: Cardiovascular;  Laterality: Left;   PERCUTANEOUS CORONARY STENT INTERVENTION (PCI-S) N/A 12/06/2013   Procedure: PERCUTANEOUS CORONARY STENT INTERVENTION (PCI-S);  Surgeon: Sinclair Grooms, MD;  Location: [facility] CATH LAB;  Service: Cardiovascular;  Laterality: N/A;   VEIN BYPASS SURGERY     [place] Bypass Surgery     Family History  Problem Relation Age of Onset   Heart attack Father 43       died first MI at age 72   Heart failure Mother     Social History:  reports that he has quit smoking. His smoking use included cigarettes. He has never used smokeless tobacco. He reports current alcohol use of about 1.0 standard drink of alcohol per week. He reports that he does not use drugs.  Allergies:  Allergies  Allergen Reactions   Amoxicillin Diarrhea    Medications: I have reviewed the patient's current medications.  Results for orders placed or performed during the hospital encounter of 11/01/20 (from the past 48 hour(s))  CBC with Differential     Status: Abnormal   Collection Time: 11/01/20 11:29 AM  Result Value Ref Range   WBC 5.3 4.0 - 10.5 K/uL   RBC 3.80 (L) 4.22 - 5.81 MIL/uL   Hemoglobin 12.2 (L) 13.0 - 17.0 g/dL   HCT 37.2 (L) 39.0 - 52.0 %   MCV 97.9 80.0 - 100.0 fL   MCH 32.1  26.0 - 34.0 pg   MCHC 32.8 30.0 - 36.0 g/dL   RDW 13.8 11.5 - 15.5 %   Platelets 121 (L) 150 - 400 K/uL   nRBC 0.0 0.0 - 0.2 %   Neutrophils Relative % 57 %   Neutro Abs 3.0 1.7 - 7.7 K/uL   Lymphocytes Relative 14 %   Lymphs Abs 0.7 0.7 - 4.0 K/uL   Monocytes Relative 14 %   Monocytes Absolute 0.7 0.1 - 1.0 K/uL   Eosinophils Relative 14 %   Eosinophils Absolute 0.8 (H) 0.0 - 0.5 K/uL   Basophils Relative 1 %   Basophils Absolute 0.1 0.0 - 0.1 K/uL   Immature Granulocytes 0 %   Abs Immature Granulocytes 0.02 0.00 - 0.07 K/uL    Comment: Performed at [facility], [address] [place], [place] [address]  Comprehensive metabolic panel     Status: Abnormal    Collection Time: 11/01/20 11:29 AM  Result Value Ref Range   Sodium 137 135 - 145 mmol/L   Potassium 4.6 3.5 - 5.1 mmol/L   Chloride 102 98 - 111 mmol/L   CO2 29 22 - 32 mmol/L   Glucose, Bld 158 (H) 70 - 99 mg/dL    Comment: Glucose reference range applies only to samples taken after fasting for at least 8 hours.   BUN 50 (H) 8 - 23 mg/dL   Creatinine, Ser 2.89 (H) 0.61 - 1.24 mg/dL   Calcium 8.9 8.9 - 10.3 mg/dL   Total Protein 6.4 (L) 6.5 - 8.1 g/dL   Albumin 3.3 (L) 3.5 - 5.0 g/dL   AST 37 15 - 41 U/L   ALT 54 (H) 0 - 44 U/L   Alkaline Phosphatase 142 (H) 38 - 126 U/L   Total Bilirubin 0.6 0.3 - 1.2 mg/dL   GFR, Estimated 20 (L) >60 mL/min    Comment: (NOTE) Calculated using the CKD-EPI Creatinine Equation (2021)    Anion gap 6 5 - 15    Comment: Performed at [facility], [address] [place], [place] [address]  Resp Panel by RT-PCR (Flu A&B, Covid) Nasopharyngeal Swab     Status: None   Collection Time: 11/01/20  3:43 PM   Specimen: Nasopharyngeal Swab; Nasopharyngeal(NP) swabs in vial transport medium  Result Value Ref Range   SARS Coronavirus 2 by RT PCR NEGATIVE NEGATIVE    Comment: (NOTE) SARS-CoV-2 target nucleic acids are NOT DETECTED.  The SARS-CoV-2 RNA is generally detectable in upper respiratory specimens during the acute phase of infection. The lowest concentration of SARS-CoV-2 viral copies this assay can detect is 138 copies/mL. A negative result does not preclude SARS-Cov-2 infection and should not be used as the sole basis for treatment or other patient management decisions. A negative result may occur with  improper specimen collection/handling, submission of specimen other than nasopharyngeal swab, presence of viral mutation(s) within the areas targeted by this assay, and inadequate number of viral copies(<138 copies/mL). A negative result must be combined with clinical observations, patient history, and epidemiological information. The  expected result is Negative.  Fact Sheet for Patients:  EntrepreneurPulse.com.au  Fact Sheet for Healthcare Providers:  IncredibleEmployment.be  This test is no t yet approved or cleared by the [place] FDA and  has been authorized for detection and/or diagnosis of SARS-CoV-2 by FDA under an Emergency Use Authorization (EUA). This EUA will remain  in effect (meaning this test can be used) for the duration of the COVID-19 declaration under Section  564(b)(1) of the Act, 21 U.S.C.section 360bbb-3(b)(1), unless the authorization is terminated  or revoked sooner.       Influenza A by PCR NEGATIVE NEGATIVE   Influenza B by PCR NEGATIVE NEGATIVE    Comment: (NOTE) The Xpert Xpress SARS-CoV-2/FLU/RSV plus assay is intended as an aid in the diagnosis of influenza from Nasopharyngeal swab specimens and should not be used as a sole basis for treatment. Nasal washings and aspirates are unacceptable for Xpert Xpress SARS-CoV-2/FLU/RSV testing.  Fact Sheet for Patients: EntrepreneurPulse.com.au  Fact Sheet for Healthcare Providers: IncredibleEmployment.be  This test is not yet approved or cleared by the [place] FDA and has been authorized for detection and/or diagnosis of SARS-CoV-2 by FDA under an Emergency Use Authorization (EUA). This EUA will remain in effect (meaning this test can be used) for the duration of the COVID-19 declaration under Section 564(b)(1) of the Act, 21 U.S.C. section 360bbb-3(b)(1), unless the authorization is terminated or revoked.  Performed at [facility], [address] [place], [place] [address]   Creatinine, serum     Status: Abnormal   Collection Time: 11/01/20  6:03 PM  Result Value Ref Range   Creatinine, Ser 2.91 (H) 0.61 - 1.24 mg/dL   GFR, Estimated 20 (L) >60 mL/min    Comment: (NOTE) Calculated using the CKD-EPI Creatinine Equation (2021) Performed  at [facility], [address] [place], [place] [address]   Glucose, capillary     Status: Abnormal   Collection Time: 11/01/20 11:03 PM  Result Value Ref Range   Glucose-Capillary 157 (H) 70 - 99 mg/dL    Comment: Glucose reference range applies only to samples taken after fasting for at least 8 hours.  Renal function panel     Status: Abnormal   Collection Time: 11/02/20  4:40 AM  Result Value Ref Range   Sodium 138 135 - 145 mmol/L   Potassium 4.5 3.5 - 5.1 mmol/L   Chloride 104 98 - 111 mmol/L   CO2 29 22 - 32 mmol/L   Glucose, Bld 148 (H) 70 - 99 mg/dL    Comment: Glucose reference range applies only to samples taken after fasting for at least 8 hours.   BUN 50 (H) 8 - 23 mg/dL   Creatinine, Ser 2.88 (H) 0.61 - 1.24 mg/dL   Calcium 8.9 8.9 - 10.3 mg/dL   Phosphorus 4.3 2.5 - 4.6 mg/dL   Albumin 3.2 (L) 3.5 - 5.0 g/dL   GFR, Estimated 20 (L) >60 mL/min    Comment: (NOTE) Calculated using the CKD-EPI Creatinine Equation (2021)    Anion gap 5 5 - 15    Comment: Performed at [facility], [address] [place],  [address]  CBC     Status: Abnormal   Collection Time: 11/02/20  4:40 AM  Result Value Ref Range   WBC 4.7 4.0 - 10.5 K/uL   RBC 3.73 (L) 4.22 - 5.81 MIL/uL   Hemoglobin 11.8 (L) 13.0 - 17.0 g/dL   HCT 36.0 (L) 39.0 - 52.0 %   MCV 96.5 80.0 - 100.0 fL   MCH 31.6 26.0 - 34.0 pg   MCHC 32.8 30.0 - 36.0 g/dL   RDW 13.5 11.5 - 15.5 %   Platelets 113 (L) 150 - 400 K/uL    Comment: Immature Platelet Fraction may be clinically indicated, consider ordering this additional test [id number]    nRBC 0.0 0.0 - 0.2 %    Comment: Performed at [facility], [address] [place], [place] [address]  Glucose, capillary     Status: Abnormal   Collection Time: 11/02/20  7:49 AM  Result Value Ref Range   Glucose-Capillary 123 (H) 70 - 99 mg/dL    Comment: Glucose reference range applies only to samples taken after fasting for at least 8  hours.   Comment 1 Notify RN    Comment 2 Document in Chart   Glucose, capillary     Status: Abnormal   Collection Time: 11/02/20 11:42 AM  Result Value Ref Range   Glucose-Capillary 166 (H) 70 - 99 mg/dL    Comment: Glucose reference range applies only to samples taken after fasting for at least 8 hours.    No results found.  Review of Systems  All other systems reviewed and are negative. Blood pressure 132/64, pulse (!) 57, temperature 98.1 F (36.7 C), temperature source Oral, resp. rate 18, height 6' (1.829 m), weight 102.4 kg, SpO2 99 %. Physical Exam Vitals and nursing note reviewed.  Constitutional:      Appearance: Normal appearance. He is normal weight.  HENT:     Head: Normocephalic and atraumatic.     Nose: Nose normal.  Eyes:     Extraocular Movements: Extraocular movements intact.     Conjunctiva/sclera: Conjunctivae normal.     Pupils: Pupils are equal, round, and reactive to light.  Cardiovascular:     Rate and Rhythm: Normal rate and regular rhythm.  Pulmonary:     Effort: Pulmonary effort is normal.     Breath sounds: Normal breath sounds.  Abdominal:     General: Abdomen is flat. Bowel sounds are normal.     Palpations: Abdomen is soft.  Musculoskeletal:        General: Normal range of motion.     Cervical back: Normal range of motion.  Skin:    General: Skin is warm and dry.     Capillary Refill: Capillary refill takes less than 2 seconds.  Neurological:     General: No focal deficit present.     Mental Status: He is alert and oriented to person, place, and time.  Psychiatric:        Mood and Affect: Mood normal.        Behavior: Behavior normal.        Thought Content: Thought content normal.        Judgment: Judgment normal.    Assessment/Plan: Will put on the schedule for removal of PD catheter and placement of permacath by Dr. [name] in the OR. NPO after midnight Cardiac clearance  Elmore Guise 11/02/2020, 3:25 PM

## 2020-11-03 ENCOUNTER — Encounter: Payer: Self-pay | Admitting: Internal Medicine

## 2020-11-03 ENCOUNTER — Encounter
Admission: EM | Disposition: A | Discharge status: Home / Self Care | Payer: Self-pay | Source: Home / Self Care | Attending: Internal Medicine

## 2020-11-03 ENCOUNTER — Inpatient Hospital Stay: Payer: Medicare Other | Admitting: Registered Nurse

## 2020-11-03 ENCOUNTER — Inpatient Hospital Stay: Payer: Medicare Other

## 2020-11-03 DIAGNOSIS — N2581 Secondary hyperparathyroidism of renal origin: Secondary | ICD-10-CM | POA: Diagnosis not present

## 2020-11-03 DIAGNOSIS — T82591A Other mechanical complication of surgically created arteriovenous shunt, initial encounter: Secondary | ICD-10-CM

## 2020-11-03 DIAGNOSIS — Y828 Other medical devices associated with adverse incidents: Secondary | ICD-10-CM

## 2020-11-03 DIAGNOSIS — D509 Iron deficiency anemia, unspecified: Secondary | ICD-10-CM | POA: Diagnosis not present

## 2020-11-03 DIAGNOSIS — Z992 Dependence on renal dialysis: Secondary | ICD-10-CM | POA: Diagnosis not present

## 2020-11-03 DIAGNOSIS — N186 End stage renal disease: Secondary | ICD-10-CM | POA: Diagnosis not present

## 2020-11-03 DIAGNOSIS — E782 Mixed hyperlipidemia: Secondary | ICD-10-CM

## 2020-11-03 HISTORY — PX: REMOVAL OF A DIALYSIS CATHETER: SHX6053

## 2020-11-03 HISTORY — PX: INSERTION OF DIALYSIS CATHETER: SHX1324

## 2020-11-03 LAB — CBC
HCT: 37.7 % — ABNORMAL LOW (ref 39.0–52.0)
Hemoglobin: 12.5 g/dL — ABNORMAL LOW (ref 13.0–17.0)
MCH: 31.6 pg (ref 26.0–34.0)
MCHC: 33.2 g/dL (ref 30.0–36.0)
MCV: 95.2 fL (ref 80.0–100.0)
Platelets: 119 10*3/uL — ABNORMAL LOW (ref 150–400)
RBC: 3.96 MIL/uL — ABNORMAL LOW (ref 4.22–5.81)
RDW: 13.3 % (ref 11.5–15.5)
WBC: 5.4 10*3/uL (ref 4.0–10.5)
nRBC: 0 % (ref 0.0–0.2)

## 2020-11-03 LAB — RENAL FUNCTION PANEL
Albumin: 3.3 g/dL — ABNORMAL LOW (ref 3.5–5.0)
Anion gap: 6 (ref 5–15)
BUN: 54 mg/dL — ABNORMAL HIGH (ref 8–23)
CO2: 30 mmol/L (ref 22–32)
Calcium: 8.9 mg/dL (ref 8.9–10.3)
Chloride: 101 mmol/L (ref 98–111)
Creatinine, Ser: 3.28 mg/dL — ABNORMAL HIGH (ref 0.61–1.24)
GFR, Estimated: 18 mL/min — ABNORMAL LOW (ref >60)
Glucose, Bld: 150 mg/dL — ABNORMAL HIGH (ref 70–99)
Phosphorus: 4.4 mg/dL (ref 2.5–4.6)
Potassium: 4.9 mmol/L (ref 3.5–5.1)
Sodium: 137 mmol/L (ref 135–145)

## 2020-11-03 LAB — GLUCOSE, CAPILLARY
Glucose-Capillary: 133 mg/dL — ABNORMAL HIGH (ref 70–99)
Glucose-Capillary: 122 mg/dL — ABNORMAL HIGH (ref 70–99)
Glucose-Capillary: 104 mg/dL — ABNORMAL HIGH (ref 70–99)
Glucose-Capillary: 194 mg/dL — ABNORMAL HIGH (ref 70–99)

## 2020-11-03 LAB — HEMOGLOBIN A1C
Hgb A1c MFr Bld: 6.3 % — ABNORMAL HIGH (ref 4.8–5.6)
Mean Plasma Glucose: 134 mg/dL

## 2020-11-03 LAB — MRSA PCR SCREENING: MRSA by PCR: NEGATIVE

## 2020-11-03 LAB — TYPE AND SCREEN
ABO/RH(D): O POS
Antibody Screen: NEGATIVE

## 2020-11-03 LAB — HEPATITIS B CORE ANTIBODY, TOTAL: Hep B Core Total Ab: NONREACTIVE

## 2020-11-03 LAB — HEPATITIS B SURFACE ANTIGEN: Hepatitis B Surface Ag: NONREACTIVE

## 2020-11-03 SURGERY — INSERTION OF DIALYSIS CATHETER
Anesthesia: General | Laterality: Right

## 2020-11-03 MED ORDER — ONDANSETRON HCL 4 MG/2ML IJ SOLN: 4.0000 mg | Freq: Once | INTRAMUSCULAR | Status: DC | PRN

## 2020-11-03 MED ORDER — FENTANYL CITRATE (PF) 100 MCG/2ML IJ SOLN
INTRAMUSCULAR | Status: DC | PRN
  Administered 2020-11-03 (×4): 25 ug via INTRAVENOUS

## 2020-11-03 MED ORDER — ROCURONIUM BROMIDE 10 MG/ML (PF) SYRINGE
PREFILLED_SYRINGE | INTRAVENOUS | Status: AC
  Filled 2020-11-03: qty 10

## 2020-11-03 MED ORDER — DEXAMETHASONE SODIUM PHOSPHATE 10 MG/ML IJ SOLN
INTRAMUSCULAR | Status: DC | PRN
  Administered 2020-11-03: 5 mg via INTRAVENOUS

## 2020-11-03 MED ORDER — FENTANYL CITRATE (PF) 100 MCG/2ML IJ SOLN
INTRAMUSCULAR | Status: AC
  Filled 2020-11-03: qty 2

## 2020-11-03 MED ORDER — CHLORHEXIDINE GLUCONATE CLOTH 2 % EX PADS
6.0000 | MEDICATED_PAD | Freq: Every day | CUTANEOUS | Status: DC
  Administered 2020-11-03 – 2020-11-05 (×3): 6 via TOPICAL

## 2020-11-03 MED ORDER — ONDANSETRON HCL 4 MG/2ML IJ SOLN
INTRAMUSCULAR | Status: DC | PRN
  Administered 2020-11-03: 4 mg via INTRAVENOUS

## 2020-11-03 MED ORDER — SUGAMMADEX SODIUM 200 MG/2ML IV SOLN
INTRAVENOUS | Status: DC | PRN
  Administered 2020-11-03: 200 mg via INTRAVENOUS

## 2020-11-03 MED ORDER — PROPOFOL 10 MG/ML IV BOLUS
INTRAVENOUS | Status: DC | PRN
  Administered 2020-11-03: 80 mg via INTRAVENOUS
  Administered 2020-11-03: 20 mg via INTRAVENOUS

## 2020-11-03 MED ORDER — DEXMEDETOMIDINE (PRECEDEX) IN NS 20 MCG/5ML (4 MCG/ML) IV SYRINGE
PREFILLED_SYRINGE | INTRAVENOUS | Status: AC
  Filled 2020-11-03: qty 5

## 2020-11-03 MED ORDER — FENTANYL CITRATE (PF) 100 MCG/2ML IJ SOLN: 25.0000 ug | INTRAMUSCULAR | Status: DC | PRN

## 2020-11-03 MED ORDER — HEPARIN SODIUM (PORCINE) 1000 UNIT/ML IJ SOLN
1000.0000 [IU] | Freq: Once | INTRAMUSCULAR | Status: AC
  Administered 2020-11-03: 1000 [IU] via INTRAVENOUS

## 2020-11-03 MED ORDER — LIDOCAINE HCL (CARDIAC) PF 100 MG/5ML IV SOSY
PREFILLED_SYRINGE | INTRAVENOUS | Status: DC | PRN
  Administered 2020-11-03: 60 mg via INTRAVENOUS

## 2020-11-03 MED ORDER — BUPIVACAINE-EPINEPHRINE (PF) 0.25% -1:200000 IJ SOLN
INTRAMUSCULAR | Status: AC
  Filled 2020-11-03: qty 30

## 2020-11-03 MED ORDER — PROPOFOL 10 MG/ML IV BOLUS
INTRAVENOUS | Status: AC
  Filled 2020-11-03: qty 20

## 2020-11-03 MED ORDER — SODIUM CHLORIDE 0.9 % IV SOLN: INTRAVENOUS | Status: DC | PRN

## 2020-11-03 MED ORDER — HEPARIN SODIUM (PORCINE) 1000 UNIT/ML IJ SOLN
4200.0000 [IU] | Freq: Once | INTRAMUSCULAR | Status: DC
  Filled 2020-11-03: qty 4.2

## 2020-11-03 MED ORDER — ROCURONIUM BROMIDE 100 MG/10ML IV SOLN
INTRAVENOUS | Status: DC | PRN
  Administered 2020-11-03: 50 mg via INTRAVENOUS

## 2020-11-03 MED ORDER — MEPERIDINE HCL 25 MG/ML IJ SOLN: 6.2500 mg | INTRAMUSCULAR | Status: DC | PRN

## 2020-11-03 SURGICAL SUPPLY — 42 items
BLADE SURG 15 STRL LF DISP TIS (BLADE) ×2 IMPLANT
BLADE SURG 15 STRL SS (BLADE) ×4
BLADE SURG SZ11 CARB STEEL (BLADE) ×4 IMPLANT
CANISTER SUCT 1200ML W/VALVE (MISCELLANEOUS) ×4 IMPLANT
CATH CANNON HEMO 15FR 19 (HEMODIALYSIS SUPPLIES) ×2 IMPLANT
CHLORAPREP W/TINT 26 (MISCELLANEOUS) ×4 IMPLANT
COVER PROBE FLX POLY STRL (MISCELLANEOUS) ×4 IMPLANT
COVER WAND RF STERILE (DRAPES) ×4 IMPLANT
DERMABOND ADVANCED (GAUZE/BANDAGES/DRESSINGS) ×2
DERMABOND ADVANCED .7 DNX12 (GAUZE/BANDAGES/DRESSINGS) ×2 IMPLANT
DRAPE C-ARM XRAY 36X54 (DRAPES) ×4 IMPLANT
DRAPE LAPAROTOMY 77X122 PED (DRAPES) ×4 IMPLANT
DRAPE LAPAROTOMY TRNSV 106X77 (MISCELLANEOUS) ×4 IMPLANT
DRSG TEGADERM 2-3/8X2-3/4 SM (GAUZE/BANDAGES/DRESSINGS) ×6 IMPLANT
ELECT CAUTERY BLADE 6.4 (BLADE) ×4 IMPLANT
ELECT REM PT RETURN 9FT ADLT (ELECTROSURGICAL) ×4
ELECTRODE REM PT RTRN 9FT ADLT (ELECTROSURGICAL) ×2 IMPLANT
GAUZE SPONGE 4X4 12PLY STRL (GAUZE/BANDAGES/DRESSINGS) ×4 IMPLANT
GLOVE SURG SYN 7.0 (GLOVE) ×4 IMPLANT
GLOVE SURG SYN 7.0 PF PI (GLOVE) ×2 IMPLANT
GOWN STRL REUS W/ TWL LRG LVL3 (GOWN DISPOSABLE) ×2 IMPLANT
GOWN STRL REUS W/ TWL XL LVL3 (GOWN DISPOSABLE) ×2 IMPLANT
GOWN STRL REUS W/TWL LRG LVL3 (GOWN DISPOSABLE) ×4
GOWN STRL REUS W/TWL XL LVL3 (GOWN DISPOSABLE) ×4
KIT TURNOVER KIT A (KITS) ×4 IMPLANT
LABEL OR SOLS (LABEL) ×4 IMPLANT
MANIFOLD NEPTUNE II (INSTRUMENTS) ×2 IMPLANT
NDL HYPO 25X1 1.5 SAFETY (NEEDLE) ×2 IMPLANT
NEEDLE HYPO 25X1 1.5 SAFETY (NEEDLE) ×4 IMPLANT
NS IRRIG 500ML POUR BTL (IV SOLUTION) ×4 IMPLANT
PACK ANGIOGRAPHY (CUSTOM PROCEDURE TRAY) ×2 IMPLANT
PACK BASIN MINOR ARMC (MISCELLANEOUS) ×4 IMPLANT
SPONGE GAUZE 2X2 8PLY STER LF (GAUZE/BANDAGES/DRESSINGS) ×2
SPONGE GAUZE 2X2 8PLY STRL LF (GAUZE/BANDAGES/DRESSINGS) ×2 IMPLANT
SUT MNCRL AB 4-0 PS2 18 (SUTURE) ×4 IMPLANT
SUT MNCRL+ 5-0 UNDYED PC-3 (SUTURE) ×2 IMPLANT
SUT MONOCRYL 5-0 (SUTURE) ×4
SUT VIC AB 3-0 SH 27 (SUTURE) ×8
SUT VIC AB 3-0 SH 27X BRD (SUTURE) ×4 IMPLANT
SUT VICRYL+ 3-0 36IN CT-1 (SUTURE) ×6 IMPLANT
SYR 10ML LL (SYRINGE) ×4 IMPLANT
SYR 20ML LL LF (SYRINGE) ×4 IMPLANT

## 2020-11-03 NOTE — Transfer of Care (Signed)
Immediate Anesthesia Transfer of Care Note  Patient: Matthew [name]  Procedure(s) Performed: INSERTION OF Perm Cath in the RIGHT INTERNAL JUGULAR (Right) REMOVAL OF A DIALYSIS CATHETER  Patient Location: PACU  Anesthesia Type:General  Level of Consciousness: drowsy and patient cooperative  Airway & Oxygen Therapy: Patient Spontanous Breathing and Patient connected to face mask oxygen  Post-op Assessment: Report given to RN and Post -op Vital signs reviewed and stable  Post vital signs: Reviewed and stable  Last Vitals:  Vitals Value Taken Time  BP 131/60 11/03/20 1601  Temp    Pulse 66 11/03/20 1604  Resp 12 11/03/20 1604  SpO2 100 % 11/03/20 1604  Vitals shown include unvalidated device data.  Last Pain:  Vitals:   11/03/20 1327  TempSrc: Oral  PainSc: 0-No pain         Complications: No notable events documented.

## 2020-11-03 NOTE — Progress Notes (Signed)
Upon arrival to room 211, noted abd dressing:  gauze with drainage under opsite.  Reinforced with gauze and new opsite.

## 2020-11-03 NOTE — Interval H&P Note (Signed)
History and Physical Interval Note:  11/03/2020 1:40 PM  Matthew [name]  has presented today for surgery, with the diagnosis of GSRD Non-Functioning Catheter.  The various methods of treatment have been discussed with the patient and family. After consideration of risks, benefits and other options for treatment, the patient has consented to  Procedure(s): INSERTION OF Perm Cath (N/A) REMOVAL OF A DIALYSIS CATHETER (N/A) as a surgical intervention.  The patient's history has been reviewed, patient examined, no change in status, stable for surgery.  I have reviewed the patient's chart and labs.  Questions were answered to the patient's satisfaction.     [name]

## 2020-11-03 NOTE — Op Note (Signed)
OPERATIVE NOTE    PRE-OPERATIVE DIAGNOSIS: 1. ESRD 2. Failed PD catheter  POST-OPERATIVE DIAGNOSIS: same as above  PROCEDURE: Ultrasound guidance for vascular access to the right internal jugular vein Fluoroscopic guidance for placement of catheter Placement of a 19 cm tip to cuff tunneled hemodialysis catheter via the right internal jugular vein  SURGEON: [name], MD  ANESTHESIA:  general  ESTIMATED BLOOD LOSS: 3 cc  FLUORO TIME: less than one minute  CONTRAST: none  FINDING(S): 1.  Patent right internal jugular vein  SPECIMEN(S):  None  INDICATIONS:   Matthew [name] is a 87 y.o.male who presents with renal failure and failed PD catheter.  The patient needs long term dialysis access for their ESRD, and a Permcath is necessary.  Risks and benefits are discussed and informed consent is obtained.    DESCRIPTION: After obtaining full informed written consent, the patient was brought back to the vascular suited. The patient's right neck and chest were sterilely prepped and draped in a sterile surgical field was created. Moderate conscious sedation was administered during a face to face encounter with the patient throughout the procedure with my supervision of the RN administering medicines and monitoring the patient's vital signs, pulse oximetry, telemetry and mental status throughout from the start of the procedure until the patient was taken to the recovery room.  The right internal jugular vein was visualized with ultrasound and found to be patent. It was then accessed under direct ultrasound guidance and a permanent image was recorded. A wire was placed. After skin nick and dilatation, the peel-away sheath was placed over the wire. I then turned my attention to an area under the clavicle. Approximately 1-2 fingerbreadths below the clavicle a small counterincision was created and tunneled from the subclavicular incision to the access site. Using fluoroscopic guidance, a 19  centimeter tip to cuff tunneled hemodialysis catheter was selected, and tunneled from the subclavicular incision to the access site. It was then placed through the peel-away sheath and the peel-away sheath was removed. Using fluoroscopic guidance the catheter tips were parked in the right atrium. The appropriate distal connectors were placed. It withdrew blood well and flushed easily with heparinized saline and a concentrated heparin solution was then placed. It was secured to the chest wall with 2 Prolene sutures. The access incision was closed single 4-0 Monocryl. A 4-0 Monocryl pursestring suture was placed around the exit site. Sterile dressings were placed. The patient tolerated the procedure well and was taken to the recovery room in stable condition.  COMPLICATIONS: None  CONDITION: Stable  [name], MD 11/03/2020 3:34 PM   This note was created with Dragon Medical transcription system. Any errors in dictation are purely unintentional.

## 2020-11-03 NOTE — Consult Note (Signed)
Matthew [name] is a 87 y.o. male  [id number]  Primary Cardiologist: Neoma Laming Reason for Consultation: Preoperative clearance  HPI: This is a 87 year old white male with history of congestive heart failure aortic dissection coronary artery disease and diabetes along with end-stage renal failure.  Patient denies any chest pain or shortness of breath patient has peritoneal dialysis being switched to regular dialysis and needs clearance.   Review of Systems: No chest pain   Past Medical History:  Diagnosis Date   Anemia    Arthritis    Broken arm 05/2013   Left   CHF (congestive heart failure) (HCC)    Chronic kidney disease    Coronary artery disease    Diabetes mellitus without complication (HCC)    Dyspnea    DOE   Edema    FEET/LEGS OCCAS   Hypertension    Myocardial infarction ([place])    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   left frontal scalp, [facility]   Squamous cell carcinoma of skin 05/05/2020   left temporal scalp, in situ, [facility] 05/13/20    Medications Prior to Admission  Medication Sig Dispense Refill   amiodarone (PACERONE) 200 MG tablet Take 200 mg by mouth daily.     cholecalciferol (VITAMIN D) 25 MCG (1000 UNIT) tablet Take 1,000 Units by mouth daily with breakfast.     clopidogrel (PLAVIX) 75 MG tablet Take 1 tablet (75 mg total) by mouth daily with breakfast. 30 tablet 12   ezetimibe (ZETIA) 10 MG tablet Take 10 mg by mouth daily.     ferrous sulfate 325 (65 FE) MG tablet Take 325 mg by mouth 2 (two) times daily with a meal.      fluticasone (FLONASE) 50 MCG/ACT nasal spray Place 2 sprays into both nostrils daily as needed for allergies or rhinitis.     furosemide (LASIX) 40 MG tablet Take 2 tablets (80 mg total) by mouth 2 (two) times daily. (Patient taking differently: Take 40 mg by mouth daily.) 60 tablet 0   glipiZIDE (GLUCOTROL) 5 MG tablet Take 2.5 mg by mouth daily before breakfast.     levocetirizine (XYZAL) 5 MG tablet Take 10 mg by mouth every  morning.      levothyroxine (SYNTHROID) 88 MCG tablet Take 88 mcg by mouth daily before breakfast.     metoprolol succinate (TOPROL-XL) 50 MG 24 hr tablet Take 50 mg by mouth daily with lunch.     multivitamin (RENA-VIT) TABS tablet Take 1 tablet by mouth daily.     nitroGLYCERIN (NITROSTAT) 0.4 MG SL tablet Place 0.4 mg under the tongue every 5 (five) minutes as needed for chest pain.     Omega-3 Fatty Acids (FISH OIL) 1000 MG CAPS Take 4,000 mg by mouth daily with lunch.     potassium chloride SA (KLOR-CON) 20 MEQ tablet Take 20 mEq by mouth daily.     rosuvastatin (CRESTOR) 40 MG tablet Take 40 mg by mouth at bedtime.        amiodarone  200 mg Oral Daily   Chlorhexidine Gluconate Cloth  6 each Topical Daily   Chlorhexidine Gluconate Cloth  6 each Topical Q0600   clopidogrel  75 mg Oral Q breakfast   ferrous sulfate  325 mg Oral BID WC   furosemide  80 mg Oral BID   heparin  5,000 Units Subcutaneous Q8H   insulin aspart  0-6 Units Subcutaneous TID WC   levothyroxine  100 mcg Oral Q0600   rosuvastatin  40  mg Oral Daily    Infusions:   Allergies  Allergen Reactions   Amoxicillin Diarrhea    Social History   Socioeconomic History   Marital status: Married    Spouse name: Not on file   Number of children: Not on file   Years of education: Not on file   Highest education level: Not on file  Occupational History   Not on file  Tobacco Use   Smoking status: Former    Pack years: 0.00    Types: Cigarettes   Smokeless tobacco: Never   Tobacco comments:    quit 30 yrs ago, smoked 14-15 yrs, mainly pipe  Vaping Use   Vaping Use: Never used  Substance and Sexual Activity   Alcohol use: Yes    Alcohol/week: 1.0 standard drink    Types: 1 Glasses of wine per week    Comment: occasionally drinking only   Drug use: No   Sexual activity: Never  Other Topics Concern   Not on file  Social History Narrative   Not on file   Social Determinants of Health   Financial  Resource Strain: Not on file  Food Insecurity: Not on file  Transportation Needs: Not on file  Physical Activity: Not on file  Stress: Not on file  Social Connections: Not on file  Intimate Partner Violence: Not on file    Family History  Problem Relation Age of Onset   Heart attack Father 24       died first MI at age 49   Heart failure Mother     PHYSICAL EXAM: Vitals:   11/02/20 2325 11/03/20 0507  BP: (!) 103/47 126/63  Pulse: 64 66  Resp: 16 18  Temp: 97.8 F (36.6 C) 97.9 F (36.6 C)  SpO2: 97% 96%     Intake/Output Summary (Last 24 hours) at 11/03/2020 0805 Last data filed at 11/03/2020 0723 Gross per 24 hour  Intake 600 ml  Output 1375 ml  Net -775 ml    General:  Well appearing. No respiratory difficulty HEENT: normal Neck: supple. no JVD. Carotids 2+ bilat; no bruits. No lymphadenopathy or thryomegaly appreciated. Cor: PMI nondisplaced. Regular rate & rhythm. No rubs, gallops or murmurs. Lungs: clear Abdomen: soft, nontender, nondistended. No hepatosplenomegaly. No bruits or masses. Good bowel sounds. Extremities: no cyanosis, clubbing, rash, edema Neuro: alert & oriented x 3, cranial nerves grossly intact. moves all 4 extremities w/o difficulty. Affect pleasant.  ECG: Sinus rhythm with left bundle branch block  Results for orders placed or performed during the hospital encounter of 11/01/20 (from the past 24 hour(s))  Glucose, capillary     Status: Abnormal   Collection Time: 11/02/20 11:42 AM  Result Value Ref Range   Glucose-Capillary 166 (H) 70 - 99 mg/dL  Glucose, capillary     Status: Abnormal   Collection Time: 11/02/20  4:21 PM  Result Value Ref Range   Glucose-Capillary 123 (H) 70 - 99 mg/dL   Comment 1 Notify RN    Comment 2 Document in Chart   Glucose, capillary     Status: Abnormal   Collection Time: 11/02/20  9:26 PM  Result Value Ref Range   Glucose-Capillary 142 (H) 70 - 99 mg/dL  MRSA PCR Screening     Status: None   Collection  Time: 11/03/20 12:04 AM   Specimen: Nasal Mucosa; Nasopharyngeal  Result Value Ref Range   MRSA by PCR NEGATIVE NEGATIVE  Renal function panel     Status: Abnormal  Collection Time: 11/03/20  4:21 AM  Result Value Ref Range   Sodium 137 135 - 145 mmol/L   Potassium 4.9 3.5 - 5.1 mmol/L   Chloride 101 98 - 111 mmol/L   CO2 30 22 - 32 mmol/L   Glucose, Bld 150 (H) 70 - 99 mg/dL   BUN 54 (H) 8 - 23 mg/dL   Creatinine, Ser 3.28 (H) 0.61 - 1.24 mg/dL   Calcium 8.9 8.9 - 10.3 mg/dL   Phosphorus 4.4 2.5 - 4.6 mg/dL   Albumin 3.3 (L) 3.5 - 5.0 g/dL   GFR, Estimated 18 (L) >60 mL/min   Anion gap 6 5 - 15  CBC     Status: Abnormal   Collection Time: 11/03/20  4:21 AM  Result Value Ref Range   WBC 5.4 4.0 - 10.5 K/uL   RBC 3.96 (L) 4.22 - 5.81 MIL/uL   Hemoglobin 12.5 (L) 13.0 - 17.0 g/dL   HCT 37.7 (L) 39.0 - 52.0 %   MCV 95.2 80.0 - 100.0 fL   MCH 31.6 26.0 - 34.0 pg   MCHC 33.2 30.0 - 36.0 g/dL   RDW 13.3 11.5 - 15.5 %   Platelets 119 (L) 150 - 400 K/uL   nRBC 0.0 0.0 - 0.2 %  Glucose, capillary     Status: Abnormal   Collection Time: 11/03/20  8:02 AM  Result Value Ref Range   Glucose-Capillary 133 (H) 70 - 99 mg/dL   Comment 1 Notify RN    No results found.   ASSESSMENT AND PLAN: This patient with coronary artery disease CHF aortic dissection being managed medically presented with needing to have peritoneal dialysis dialysis switched to hemodialysis.  Needs preoperative clearance and is asymptomatic.  Advise proceeding with the procedure.  If patient needs to stop Plavix we can do that.  Matthew [name] A

## 2020-11-03 NOTE — Anesthesia Postprocedure Evaluation (Signed)
Anesthesia Post Note  Patient: Matthew [name]  Procedure(s) Performed: INSERTION OF Perm Cath in the RIGHT INTERNAL JUGULAR (Right) REMOVAL OF A DIALYSIS CATHETER  Patient location during evaluation: PACU Anesthesia Type: General Level of consciousness: awake and alert and oriented Pain management: pain level controlled Vital Signs Assessment: post-procedure vital signs reviewed and stable Respiratory status: spontaneous breathing Cardiovascular status: blood pressure returned to baseline Anesthetic complications: no   No notable events documented.   Last Vitals:  Vitals:   11/03/20 1818 11/03/20 1843  BP: 133/63 (!) 123/58  Pulse:    Resp: 19   Temp: 36.8 C   SpO2: 100% 100%    Last Pain:  Vitals:   11/03/20 1818  TempSrc: Oral  PainSc:                  Dejai Schubach

## 2020-11-03 NOTE — Progress Notes (Signed)
[facility]  ROUNDING NOTE   Subjective:  Matthew [name] is a 87 years old male . He has past  medical history significant for ESRD on PD, essential hypertension,A. fib ,coronary artery disease status post CABG, type 2 diabetes, hypothyroidism, aortic valve regurgitation and aortic thoracic aneurysm dissection, tricuspid valve regurgitation, anemia of chronic disease, hyperlipidemia. He presented to the  ED as advised by Dr. Holley Raring , due to malfunctioning peritoneal dialysis  Patient seen resting in bed Wife at bedside Currently NPO for procedure Denies shortness of breath   Objective:  Vital signs in last 24 hours:  Temp:  [97.6 F (36.4 C)-98.1 F (36.7 C)] 97.9 F (36.6 C) (06/13 1117) Pulse Rate:  [58-66] 65 (06/13 1117) Resp:  [16-18] 16 (06/13 1117) BP: (103-127)/(47-63) 124/58 (06/13 1117) SpO2:  [96 %-100 %] 99 % (06/13 1117) Weight:  [98.7 kg] 98.7 kg (06/13 0507)  Weight change: -4.99 kg Filed Weights   11/01/20 1115 11/02/20 0500 11/03/20 0507  Weight: 103.7 kg 102.4 kg 98.7 kg    Intake/Output: I/O last 3 completed shifts: In: 840 [P.O.:840] Out: 2525 [Urine:2525]   Intake/Output this shift:  Total I/O In: -  Out: 725 [Urine:725]  Physical Exam: General: No acute distress  Head: Normocephalic, atraumatic. Moist oral mucosal membranes  Eyes: Anicteric  Lungs:  Clear to auscultation bilaterally, Normal and symmetrical respiratory effort  Heart: Regular rate and rhythm  Abdomen:  Soft, nontender,slightly distended   Extremities:  Trace peripheral edema.  Neurologic: Awake, alert,oriented, moving all four extremities  Skin: No acute lesions or rashes  Access: PD site with dressing CDI    Basic Metabolic Panel: Recent Labs  Lab 11/01/20 1129 11/01/20 1803 11/02/20 0440 11/03/20 0421  NA 137  --  138 137  K 4.6  --  4.5 4.9  CL 102  --  104 101  CO2 29  --  29 30  GLUCOSE 158*  --  148* 150*  BUN 50*  --  50* 54*  CREATININE 2.89*  2.91* 2.88* 3.28*  CALCIUM 8.9  --  8.9 8.9  PHOS  --   --  4.3 4.4     Liver Function Tests: Recent Labs  Lab 11/01/20 1129 11/02/20 0440 11/03/20 0421  AST 37  --   --   ALT 54*  --   --   ALKPHOS 142*  --   --   BILITOT 0.6  --   --   PROT 6.4*  --   --   ALBUMIN 3.3* 3.2* 3.3*    No results for input(s): LIPASE, AMYLASE in the last 168 hours. No results for input(s): AMMONIA in the last 168 hours.  CBC: Recent Labs  Lab 11/01/20 1129 11/02/20 0440 11/03/20 0421  WBC 5.3 4.7 5.4  NEUTROABS 3.0  --   --   HGB 12.2* 11.8* 12.5*  HCT 37.2* 36.0* 37.7*  MCV 97.9 96.5 95.2  PLT 121* 113* 119*     Cardiac Enzymes: No results for input(s): CKTOTAL, CKMB, CKMBINDEX, TROPONINI in the last 168 hours.  BNP: Invalid input(s): POCBNP  CBG: Recent Labs  Lab 11/02/20 1142 11/02/20 1621 11/02/20 2126 11/03/20 0802 11/03/20 1103  GLUCAP 166* 123* 142* 133* 122*     Microbiology: Results for orders placed or performed during the hospital encounter of 11/01/20  Resp Panel by RT-PCR (Flu A&B, Covid) Nasopharyngeal Swab     Status: None   Collection Time: 11/01/20  3:43 PM   Specimen: Nasopharyngeal Swab; Nasopharyngeal(NP) swabs  in vial transport medium  Result Value Ref Range Status   SARS Coronavirus 2 by RT PCR NEGATIVE NEGATIVE Final    Comment: (NOTE) SARS-CoV-2 target nucleic acids are NOT DETECTED.  The SARS-CoV-2 RNA is generally detectable in upper respiratory specimens during the acute phase of infection. The lowest concentration of SARS-CoV-2 viral copies this assay can detect is 138 copies/mL. A negative result does not preclude SARS-Cov-2 infection and should not be used as the sole basis for treatment or other patient management decisions. A negative result may occur with  improper specimen collection/handling, submission of specimen other than nasopharyngeal swab, presence of viral mutation(s) within the areas targeted by this assay, and  inadequate number of viral copies(<138 copies/mL). A negative result must be combined with clinical observations, patient history, and epidemiological information. The expected result is Negative.  Fact Sheet for Patients:  EntrepreneurPulse.com.au  Fact Sheet for Healthcare Providers:  IncredibleEmployment.be  This test is no t yet approved or cleared by the [place] FDA and  has been authorized for detection and/or diagnosis of SARS-CoV-2 by FDA under an Emergency Use Authorization (EUA). This EUA will remain  in effect (meaning this test can be used) for the duration of the COVID-19 declaration under Section 564(b)(1) of the Act, 21 U.S.C.section 360bbb-3(b)(1), unless the authorization is terminated  or revoked sooner.       Influenza A by PCR NEGATIVE NEGATIVE Final   Influenza B by PCR NEGATIVE NEGATIVE Final    Comment: (NOTE) The Xpert Xpress SARS-CoV-2/FLU/RSV plus assay is intended as an aid in the diagnosis of influenza from Nasopharyngeal swab specimens and should not be used as a sole basis for treatment. Nasal washings and aspirates are unacceptable for Xpert Xpress SARS-CoV-2/FLU/RSV testing.  Fact Sheet for Patients: EntrepreneurPulse.com.au  Fact Sheet for Healthcare Providers: IncredibleEmployment.be  This test is not yet approved or cleared by the [place] FDA and has been authorized for detection and/or diagnosis of SARS-CoV-2 by FDA under an Emergency Use Authorization (EUA). This EUA will remain in effect (meaning this test can be used) for the duration of the COVID-19 declaration under Section 564(b)(1) of the Act, 21 U.S.C. section 360bbb-3(b)(1), unless the authorization is terminated or revoked.  Performed at [facility], [address] [place], [place] [address]   MRSA PCR Screening     Status: None   Collection Time: 11/03/20 12:04 AM   Specimen:  Nasal Mucosa; Nasopharyngeal  Result Value Ref Range Status   MRSA by PCR NEGATIVE NEGATIVE Final    Comment:        The GeneXpert MRSA Assay (FDA approved for NASAL specimens only), is one component of a comprehensive MRSA colonization surveillance program. It is not intended to diagnose MRSA infection nor to guide or monitor treatment for MRSA infections. Performed at [facility], [address] [place], [place] [address]     Coagulation Studies: No results for input(s): LABPROT, INR in the last 72 hours.  Urinalysis: No results for input(s): COLORURINE, LABSPEC, PHURINE, GLUCOSEU, HGBUR, BILIRUBINUR, KETONESUR, PROTEINUR, UROBILINOGEN, NITRITE, LEUKOCYTESUR in the last 72 hours.  Invalid input(s): APPERANCEUR    Imaging: No results found.   Medications:     amiodarone  200 mg Oral Daily   Chlorhexidine Gluconate Cloth  6 each Topical Daily   Chlorhexidine Gluconate Cloth  6 each Topical Q0600   clopidogrel  75 mg Oral Q breakfast   ferrous sulfate  325 mg Oral BID WC   furosemide  80 mg  Oral BID   heparin  5,000 Units Subcutaneous Q8H   insulin aspart  0-6 Units Subcutaneous TID WC   levothyroxine  100 mcg Oral Q0600   rosuvastatin  40 mg Oral Daily   acetaminophen, labetalol, melatonin, ondansetron (ZOFRAN) IV  Assessment/ Plan:  Matthew [name] is a 87 y.o.  male with past  medical history significant for ESRD on PD, essential hypertension,A. fib ,coronary artery disease status post CABG, type 2 diabetes, hypothyroidism, aortic valve regurgitation and aortic thoracic aneurysm dissection, tricuspid valve regurgitation, anemia of chronic disease, hyperlipidemia.He presented to the  ED as advised by Dr. Holley Raring , due to malfunctioning peritoneal dialysis.  ESRD on PD  Patient came into the hospital due to PD catheter malfunctioning He is receiving Furosemide PO 80 mg BID, with adequate urine output Volume and electrolyte status acceptable  Will  receive HD cath today via surgery After placement, will receive initial dialysis treatment Dialysis coordinator notified of outpatient needs Hepatitis panel and chest X-ray ordered for outpatient placement  Lab Results  Component Value Date   CREATININE 3.28 (H) 11/03/2020   CREATININE 2.88 (H) 11/02/2020   CREATININE 2.91 (H) 11/01/2020    Intake/Output Summary (Last 24 hours) at 11/03/2020 1150 Last data filed at 11/03/2020 1100 Gross per 24 hour  Intake 360 ml  Output 1600 ml  Net -1240 ml     #Malfunctioning of the PD catheter Surgery will remove PD cath today  #Hypertension with CKD BP stable Continue current regimen  #Diabetes with CKD SSi management per primary team  #Anemia of CKD Lab Results  Component Value Date   HGB 12.5 (L) 11/03/2020  Ferrous Sulfate orally Hgb above target    LOS: 2   6/13/202211:50 AM

## 2020-11-03 NOTE — Op Note (Signed)
[facility]   OPERATIVE NOTE  DATE: 11/03/2020  PRE-OPERATIVE DIAGNOSIS: ESRD, failed PD catheter  POST-OPERATIVE DIAGNOSIS: same as above  PROCEDURE: 1.   Removal of peritoneal dialysis catheter  SURGEON: [name], MD  ASSISTANT(S): none  ANESTHESIA: general  ESTIMATED BLOOD LOSS: 5 cc  FINDING(S): 1.  none  SPECIMEN(S):  PD catheter  INDICATIONS:   Patient is a 87 y.o.male with renal failure who has failed PD.  The peritoneal catheter needs to be removed.  Risks and benefits are discussed.  DESCRIPTION: After obtaining full informed written consent, the patient was brought back to the operating room and placed supine upon the operating table.  The patient received IV antibiotics prior to induction.  After obtaining adequate anesthesia, the patient was prepped and draped in the standard fashion. The previous incision beside the umbilicus and in the upper abdomen wear the deep cuffs were located were reopened, and the cuff was dissected out at the fascia without difficulty.  The catheter was then removed from the peritoneal cavity and a 0 Vicryl pursestring suture was used to close this opening.  The superficial cuff was then dissected out and freed and the catheter was then transected and removed from the field.  The transverse periumbilical incision was then closed with a 3-0 Vicryl and a 4-0 Monocryl and dermabond was placed.  A dry dressing was placed at the previous catheter exit site.  The patient was then awakened from anesthesia and taken to the recovery room in stable condition.  COMPLICATIONS: None  CONDITION: Stable   [name] 11/03/2020 3:59 PM

## 2020-11-03 NOTE — Anesthesia Procedure Notes (Signed)
Procedure Name: Intubation Date/Time: 11/03/2020 3:02 PM Performed by: Jerrye Noble, CRNA Pre-anesthesia Checklist: Patient identified, Emergency Drugs available, Suction available and Patient being monitored Patient Re-evaluated:Patient Re-evaluated prior to induction Oxygen Delivery Method: Circle system utilized Preoxygenation: Pre-oxygenation with 100% oxygen Induction Type: IV induction Ventilation: Mask ventilation without difficulty Laryngoscope Size: McGraph and 4 Grade View: Grade I Tube type: Oral Tube size: 7.5 mm Number of attempts: 1 Airway Equipment and Method: Stylet, Oral airway and Video-laryngoscopy Placement Confirmation: ETT inserted through vocal cords under direct vision, positive ETCO2 and breath sounds checked- equal and bilateral Secured at: 23 cm Tube secured with: Tape Dental Injury: Teeth and Oropharynx as per pre-operative assessment

## 2020-11-03 NOTE — Progress Notes (Signed)
[facility] [place] NAME: Matthew [name]    MR#:  [id number]  DATE OF BIRTH:  02/13/1931  SUBJECTIVE:  CHIEF COMPLAINT:   Chief Complaint  Patient presents with   Peritoneal Diaylsis Catheter Problem  Patient denies any complaint.  Daughter at bedside.  Waiting for dialysis catheter placement REVIEW OF SYSTEMS:  Review of Systems  Constitutional:  Negative for diaphoresis, fever, malaise/fatigue and weight loss.  HENT:  Negative for ear discharge, ear pain, hearing loss, nosebleeds, sore throat and tinnitus.   Eyes:  Negative for blurred vision and pain.  Respiratory:  Negative for cough, hemoptysis, shortness of breath and wheezing.   Cardiovascular:  Negative for chest pain, palpitations, orthopnea and leg swelling.  Gastrointestinal:  Negative for abdominal pain, blood in stool, constipation, diarrhea, heartburn, nausea and vomiting.  Genitourinary:  Negative for dysuria, frequency and urgency.  Musculoskeletal:  Negative for back pain and myalgias.  Skin:  Negative for itching and rash.  Neurological:  Negative for dizziness, tingling, tremors, focal weakness, seizures, weakness and headaches.  Psychiatric/Behavioral:  Negative for depression. The patient is not nervous/anxious.   DRUG ALLERGIES:   Allergies  Allergen Reactions   Amoxicillin Diarrhea   VITALS:  Blood pressure (!) 157/65, pulse (!) 58, temperature (!) 97.1 F (36.2 C), temperature source Oral, resp. rate 16, height 6' (1.829 m), weight 98.7 kg, SpO2 100 %. PHYSICAL EXAMINATION:  Physical Exam 87 year old male lying in the bed comfortably without any acute distress Lungs clear to auscultation bilaterally no wheezing rales rhonchi or crepitation Cardiovascular S1-S2 normal, no murmur rales or gallop Abdomen distended Neuro alert and oriented, nonfocal exam Skin no rash or lesion Psych normal mood and affect LABORATORY PANEL:  Male CBC Recent Labs  Lab 11/03/20 0421   WBC 5.4  HGB 12.5*  HCT 37.7*  PLT 119*    ------------------------------------------------------------------------------------------------------------------ Chemistries  Recent Labs  Lab 11/01/20 1129 11/01/20 1803 11/03/20 0421  NA 137   < > 137  K 4.6   < > 4.9  CL 102   < > 101  CO2 29   < > 30  GLUCOSE 158*   < > 150*  BUN 50*   < > 54*  CREATININE 2.89*   < > 3.28*  CALCIUM 8.9   < > 8.9  AST 37  --   --   ALT 54*  --   --   ALKPHOS 142*  --   --   BILITOT 0.6  --   --    < > = values in this interval not displayed.    RADIOLOGY:  DG Chest 2 View  Result Date: 11/03/2020 CLINICAL DATA:  Provided indication: kidney damage. EXAM: CHEST - 2 VIEW COMPARISON:  May 24, 2019. FINDINGS: Similar enlarged cardiac silhouette. Tortuous aorta. Similar mediastinal silhouette with chronically prominent right paratracheal stripe, likely vascular. Coronary artery stents. Median sternotomy and CABG. No consolidation. No visible pleural effusions or pneumothorax. Polyarticular degenerative change in diffuse osteopenia. IMPRESSION: 1. No evidence of acute cardiopulmonary disease. 2. Cardiomegaly. Electronically Signed   By: Margaretha Sheffield MD   On: 11/03/2020 13:00   ASSESSMENT AND PLAN:  87 y.o. male with medical history significant for ESRD on PD, essential hypertension, HFrEF 45%, paroxysmal A. fib not on anticoagulation, coronary artery disease status post CABG, type 2 diabetes, hypothyroidism, aortic valve regurgitation and aortic thoracic aneurysm dissection (declines surgery), tricuspid valve regurgitation, anemia of chronic disease, hyperlipidemia admitted  at the request of nephrology, Dr. Holley Raring, due to malfunctioning peritoneal dialysis  Suspected malposition of peritoneal dialysis catheter, malfunctioning, POA. Admitted at the request of nephrology Dr. Holley Raring due to malfunctioning peritoneal dialysis catheter. No reported constitutional symptoms prior to admission.    Vascular surgery planning for possible permacath hemodialysis catheter placement today 11/03/2020.   Nephrology planning hemodialysis after catheter in place Dialysis coordinator working on outpatient dialysis slot   ESRD on PD Switching over to hemodialysis now once new permacath in place   HFrEF 45% Euvolemic on exam Resume home regimen. Volume status managed with hemodialysis. Continue p.o. Lasix and Toprol-XL. Monitor daily strict I's and O's and daily weight Net IO Since Admission: -2,410 mL [11/03/20 1353]    Coronary artery disease status post CABG Denies any anginal symptoms Resume home regimen, on Plavix and Crestor.   Aortic regurgitation/aortic thoracic aneurysm dissection Has declined surgery Resume home Lasix, he still makes urine. Maintain BP normotensive IV antihypertensives with parameters to maintain SBP less than 130. Monitor closely on telemetry.   Paroxysmal A. fib, not on anticoagulation On amiodarone.  Stop Toprol-XL as heart rate in 50s In sinus rhythm Monitor on telemetry   Essential hypertension BP is currently stable Monitor vital signs.   Hyperlipidemia Continue Crestor   Hypothyroidism Continue home levothyroxine.   Type 2 diabetes with hyperglycemia Continue insulin sliding scale Avoid overcorrection in the setting of ESRD and hypoglycemia.  Body mass index is 29.51 kg/m.  Net IO Since Admission: -2,410 mL [11/03/20 1353]      Status is: Inpatient  Remains inpatient appropriate because:Ongoing diagnostic testing needed not appropriate for outpatient work up  Dispo:  Patient From: Home  Planned Disposition: Home  Medically stable for discharge: No need to get new hemodialysis catheter after which she will need dialysis and outpatient dialysis slot.     DVT prophylaxis:       heparin injection 5,000 Units Start: 11/01/20 1600 SCDs Start: 11/01/20 1555     Family Communication: (Daughter updated at bedside on 6/13   All  the records are reviewed and case discussed with Care Management/Social Worker. Management plans discussed with the patient, family and they are in agreement.  CODE STATUS: DNR Level of care: Med-Surg  TOTAL TIME TAKING CARE OF THIS PATIENT: 35 minutes.   More than 50% of the time was spent in counseling/coordination of care: YES  POSSIBLE D/C IN 1-2 DAYS, DEPENDING ON CLINICAL CONDITION.  And vascular surgery requiring new dialysis catheter placement followed by need for dialysis and obtaining outpatient dialysis slot   Max Sane M.D on 11/03/2020 at 1:53 PM  [facility]   CC: Primary care physician; Jodi Marble, MD  Note: This dictation was prepared with Dragon dictation along with smaller phrase technology. Any transcriptional errors that result from this process are unintentional.

## 2020-11-03 NOTE — Anesthesia Preprocedure Evaluation (Signed)
Anesthesia Evaluation  Patient identified by MRN, date of birth, ID band Patient awake    Reviewed: Allergy & Precautions, H&P , NPO status , reviewed documented beta blocker date and time   Airway Mallampati: III  TM Distance: >3 FB Neck ROM: Full    Dental  (+) Partial Upper, Partial Lower, Chipped, Poor Dentition, Missing   Pulmonary shortness of breath, former smoker,    Pulmonary exam normal        Cardiovascular hypertension, Pt. on medications and Pt. on home beta blockers + angina + CAD, + Past MI and +CHF  Normal cardiovascular exam  Left ventricle: The cavity size was moderately dilated. The  estimated ejection fraction was 45%. Diffuse hypokinesis.  - Aortic valve: There was moderate regurgitation.  - Right ventricle: The cavity size was moderately dilated.  - Atrial septum: No defect or patent foramen ovale was identified.  - Tricuspid valve: There was moderate regurgitation.    Neuro/Psych    GI/Hepatic   Endo/Other  diabetes, Well Controlled, Oral Hypoglycemic AgentsHypothyroidism   Renal/GU Renal disease     Musculoskeletal  (+) Arthritis , Osteoarthritis,    Abdominal   Peds  Hematology  (+) anemia ,   Anesthesia Other Findings Past Medical History: No date: Anemia No date: Arthritis 05/2013: Broken arm     Comment:  Left No date: CHF (congestive heart failure) (HCC) No date: Chronic kidney disease No date: Coronary artery disease No date: Diabetes mellitus without complication (HCC) No date: Edema     Comment:  FEET/LEGS OCCAS No date: Hypertension No date: Myocardial infarction [facility])  Reproductive/Obstetrics                            Anesthesia Physical  Anesthesia Plan  ASA: 3  Anesthesia Plan: General   Post-op Pain Management:    Induction: Intravenous  PONV Risk Score and Plan: 2 and TIVA  Airway Management Planned: Oral ETT  Additional  Equipment:   Intra-op Plan:   Post-operative Plan:   Informed Consent: I have reviewed the patients History and Physical, chart, labs and discussed the procedure including the risks, benefits and alternatives for the proposed anesthesia with the patient or authorized representative who has indicated his/her understanding and acceptance.     Dental Advisory Given  Plan Discussed with: CRNA and Anesthesiologist  Anesthesia Plan Comments:        Anesthesia Quick Evaluation

## 2020-11-04 ENCOUNTER — Encounter: Payer: Self-pay | Admitting: Vascular Surgery

## 2020-11-04 ENCOUNTER — Ambulatory Visit: Payer: Medicare Other | Admitting: Dermatology

## 2020-11-04 DIAGNOSIS — Z992 Dependence on renal dialysis: Secondary | ICD-10-CM | POA: Diagnosis not present

## 2020-11-04 DIAGNOSIS — D509 Iron deficiency anemia, unspecified: Secondary | ICD-10-CM | POA: Diagnosis not present

## 2020-11-04 DIAGNOSIS — N2581 Secondary hyperparathyroidism of renal origin: Secondary | ICD-10-CM | POA: Diagnosis not present

## 2020-11-04 DIAGNOSIS — N186 End stage renal disease: Secondary | ICD-10-CM | POA: Diagnosis not present

## 2020-11-04 DIAGNOSIS — T85621A Displacement of intraperitoneal dialysis catheter, initial encounter: Principal | ICD-10-CM

## 2020-11-04 LAB — RENAL FUNCTION PANEL
Albumin: 3.2 g/dL — ABNORMAL LOW (ref 3.5–5.0)
Anion gap: 6 (ref 5–15)
BUN: 28 mg/dL — ABNORMAL HIGH (ref 8–23)
CO2: 30 mmol/L (ref 22–32)
Calcium: 8.4 mg/dL — ABNORMAL LOW (ref 8.9–10.3)
Chloride: 100 mmol/L (ref 98–111)
Creatinine, Ser: 2.09 mg/dL — ABNORMAL HIGH (ref 0.61–1.24)
GFR, Estimated: 30 mL/min — ABNORMAL LOW (ref >60)
Glucose, Bld: 166 mg/dL — ABNORMAL HIGH (ref 70–99)
Phosphorus: 2 mg/dL — ABNORMAL LOW (ref 2.5–4.6)
Potassium: 4 mmol/L (ref 3.5–5.1)
Sodium: 136 mmol/L (ref 135–145)

## 2020-11-04 LAB — BASIC METABOLIC PANEL
Anion gap: 7 (ref 5–15)
BUN: 28 mg/dL — ABNORMAL HIGH (ref 8–23)
CO2: 29 mmol/L (ref 22–32)
Calcium: 8.4 mg/dL — ABNORMAL LOW (ref 8.9–10.3)
Chloride: 100 mmol/L (ref 98–111)
Creatinine, Ser: 2.09 mg/dL — ABNORMAL HIGH (ref 0.61–1.24)
GFR, Estimated: 30 mL/min — ABNORMAL LOW (ref >60)
Glucose, Bld: 163 mg/dL — ABNORMAL HIGH (ref 70–99)
Potassium: 4 mmol/L (ref 3.5–5.1)
Sodium: 136 mmol/L (ref 135–145)

## 2020-11-04 LAB — CBC
HCT: 33.1 % — ABNORMAL LOW (ref 39.0–52.0)
Hemoglobin: 11.3 g/dL — ABNORMAL LOW (ref 13.0–17.0)
MCH: 32.5 pg (ref 26.0–34.0)
MCHC: 34.1 g/dL (ref 30.0–36.0)
MCV: 95.1 fL (ref 80.0–100.0)
Platelets: 118 10*3/uL — ABNORMAL LOW (ref 150–400)
RBC: 3.48 MIL/uL — ABNORMAL LOW (ref 4.22–5.81)
RDW: 13 % (ref 11.5–15.5)
WBC: 10.1 10*3/uL (ref 4.0–10.5)
nRBC: 0 % (ref 0.0–0.2)

## 2020-11-04 LAB — GLUCOSE, CAPILLARY
Glucose-Capillary: 229 mg/dL — ABNORMAL HIGH (ref 70–99)
Glucose-Capillary: 149 mg/dL — ABNORMAL HIGH (ref 70–99)
Glucose-Capillary: 143 mg/dL — ABNORMAL HIGH (ref 70–99)
Glucose-Capillary: 167 mg/dL — ABNORMAL HIGH (ref 70–99)

## 2020-11-04 MED ORDER — SENNOSIDES-DOCUSATE SODIUM 8.6-50 MG PO TABS
1.0000 | ORAL_TABLET | Freq: Two times a day (BID) | ORAL | Status: DC
  Administered 2020-11-04 – 2020-11-05 (×2): 1 via ORAL
  Filled 2020-11-04 (×2): qty 1

## 2020-11-04 MED ORDER — POLYETHYLENE GLYCOL 3350 17 G PO PACK
17.0000 g | PACK | Freq: Every day | ORAL | Status: DC
  Administered 2020-11-04 – 2020-11-05 (×2): 17 g via ORAL
  Filled 2020-11-04 (×2): qty 1

## 2020-11-04 NOTE — Progress Notes (Signed)
   11/03/20 2130 11/03/20 2145  Vitals  BP 103/63  --   MAP (mmHg) 75  --   ECG Heart Rate 73  --   Resp 17  --   During Hemodialysis Assessment  Blood Flow Rate (mL/min) 300 mL/min 200 mL/min  Arterial Pressure (mmHg) -150 mmHg  --   Venous Pressure (mmHg) 160 mmHg  --   Transmembrane Pressure (mmHg) 50 mmHg  --   Ultrafiltration Rate (mL/min) 620 mL/min  --   Dialysate Flow Rate (mL/min) 600 ml/min  --   Conductivity: Machine  14  --   HD Safety Checks Performed Yes  --   Dialysis Fluid Bolus Normal Saline Normal Saline  Bolus Amount (mL) 0 mL 300 mL  Intra-Hemodialysis Comments Progressing as prescribed Tx completed

## 2020-11-04 NOTE — Progress Notes (Signed)
Peritoneal Dialysis patient known at [facility], transitioning into Hemo. In process of securing chair time.

## 2020-11-04 NOTE — Progress Notes (Signed)
   11/03/20 2000 11/03/20 2015 11/03/20 2030  Vitals  BP 100/66 (!) 111/59 (!) 102/54  MAP (mmHg) 78 73 69  BP Location  --  Left Arm Left Arm  ECG Heart Rate 82 83 73  Resp 17 14 19   During Hemodialysis Assessment  Blood Flow Rate (mL/min) 300 mL/min 300 mL/min 300 mL/min  Arterial Pressure (mmHg) -180 mmHg -180 mmHg -180 mmHg  Venous Pressure (mmHg) 180 mmHg 180 mmHg 200 mmHg  Transmembrane Pressure (mmHg) 50 mmHg 50 mmHg 50 mmHg  Ultrafiltration Rate (mL/min) 580 mL/min 580 mL/min 580 mL/min  Dialysate Flow Rate (mL/min) 600 ml/min 600 ml/min 600 ml/min  Conductivity: Machine  14 14 14   HD Safety Checks Performed Yes Yes Yes  Dialysis Fluid Bolus Normal Saline Normal Saline Normal Saline  Bolus Amount (mL) 100 mL (GIVEN TO PREVENT CLOTTING, ADDED TO uF GOAL) 0 mL (HEPARIN GIVEN TO PREVENT CLOTTING DUE TO LOW bfr AND INCREASING TRANSDUCER pRESSURES) 0 mL  Intra-Hemodialysis Comments Progressing as prescribed Progressing as prescribed Progressing as prescribed    11/03/20 2045 11/03/20 2100 11/03/20 2115  Vitals  BP 113/62 (!) 116/56 (!) 109/58  MAP (mmHg) 75 73 74  BP Location Left Arm  --   --   ECG Heart Rate 68 70 69  Resp 17 18 18   During Hemodialysis Assessment  Blood Flow Rate (mL/min) 300 mL/min 300 mL/min 300 mL/min  Arterial Pressure (mmHg) -180 mmHg -180 mmHg -180 mmHg  Venous Pressure (mmHg) 200 mmHg 200 mmHg 200 mmHg  Transmembrane Pressure (mmHg) 50 mmHg 50 mmHg 50 mmHg  Ultrafiltration Rate (mL/min) 580 mL/min 580 mL/min 620 mL/min  Dialysate Flow Rate (mL/min) 600 ml/min 600 ml/min 600 ml/min  Conductivity: Machine  14 14 14   HD Safety Checks Performed Yes Yes Yes  Dialysis Fluid Bolus Normal Saline Normal Saline Normal Saline  Bolus Amount (mL) 0 mL 0 mL 0 mL  Intra-Hemodialysis Comments Progressing as prescribed Progressing as prescribed Progressing as prescribed

## 2020-11-04 NOTE — Progress Notes (Signed)
[facility]  ROUNDING NOTE   Subjective:  Matthew [name] is a 87 years old male . He has past  medical history significant for ESRD on PD, essential hypertension,A. fib ,coronary artery disease status post CABG, type 2 diabetes, hypothyroidism, aortic valve regurgitation and aortic thoracic aneurysm dissection, tricuspid valve regurgitation, anemia of chronic disease, hyperlipidemia. He presented to the  ED as advised by Dr. Holley Raring , due to malfunctioning peritoneal dialysis  Patient seen resting in bed Daughter at bedside Tolerating meals without nausea Denies shortness of breath   Objective:  Vital signs in last 24 hours:  Temp:  [97.1 F (36.2 C)-98.3 F (36.8 C)] 98 F (36.7 C) (06/14 0857) Pulse Rate:  [58-72] 69 (06/14 0857) Resp:  [12-20] 18 (06/14 0857) BP: (100-157)/(51-66) 114/51 (06/14 0857) SpO2:  [93 %-100 %] 100 % (06/14 0857) Weight:  [98.7 kg-102.2 kg] 102.2 kg (06/14 0430)  Weight change: -0.003 kg Filed Weights   11/03/20 0507 11/03/20 1327 11/04/20 0430  Weight: 98.7 kg 98.7 kg 102.2 kg    Intake/Output: I/O last 3 completed shifts: In: 240 [P.O.:240] Out: 2535 [Urine:1525; Other:1000; Blood:10]   Intake/Output this shift:  Total I/O In: 240 [P.O.:240] Out: 100 [Urine:100]  Physical Exam: General: No acute distress  Head: Normocephalic, atraumatic. Moist oral mucosal membranes  Eyes: Anicteric  Lungs:  Clear to auscultation bilaterally, Normal and symmetrical respiratory effort  Heart: Regular rate and rhythm  Abdomen:  Soft, nontender,slightly distended  Extremities:  Trace peripheral edema.  Neurologic: Awake, alert,oriented, moving all four extremities  Skin: No acute lesions or rashes, abdominal dressing wound, old drainage  Access: Rt IJ Permcath (11/03/20)    Basic Metabolic Panel: Recent Labs  Lab 11/01/20 1129 11/01/20 1803 11/02/20 0440 11/03/20 0421  NA 137  --  138 137  K 4.6  --  4.5 4.9  CL 102  --  104 101   CO2 29  --  29 30  GLUCOSE 158*  --  148* 150*  BUN 50*  --  50* 54*  CREATININE 2.89* 2.91* 2.88* 3.28*  CALCIUM 8.9  --  8.9 8.9  PHOS  --   --  4.3 4.4     Liver Function Tests: Recent Labs  Lab 11/01/20 1129 11/02/20 0440 11/03/20 0421  AST 37  --   --   ALT 54*  --   --   ALKPHOS 142*  --   --   BILITOT 0.6  --   --   PROT 6.4*  --   --   ALBUMIN 3.3* 3.2* 3.3*    No results for input(s): LIPASE, AMYLASE in the last 168 hours. No results for input(s): AMMONIA in the last 168 hours.  CBC: Recent Labs  Lab 11/01/20 1129 11/02/20 0440 11/03/20 0421  WBC 5.3 4.7 5.4  NEUTROABS 3.0  --   --   HGB 12.2* 11.8* 12.5*  HCT 37.2* 36.0* 37.7*  MCV 97.9 96.5 95.2  PLT 121* 113* 119*     Cardiac Enzymes: No results for input(s): CKTOTAL, CKMB, CKMBINDEX, TROPONINI in the last 168 hours.  BNP: Invalid input(s): POCBNP  CBG: Recent Labs  Lab 11/03/20 0802 11/03/20 1103 11/03/20 1609 11/03/20 2206 11/04/20 0728  GLUCAP 133* 122* 104* 194* 229*     Microbiology: Results for orders placed or performed during the hospital encounter of 11/01/20  Resp Panel by RT-PCR (Flu A&B, Covid) Nasopharyngeal Swab     Status: None   Collection Time: 11/01/20  3:43 PM  Specimen: Nasopharyngeal Swab; Nasopharyngeal(NP) swabs in vial transport medium  Result Value Ref Range Status   SARS Coronavirus 2 by RT PCR NEGATIVE NEGATIVE Final    Comment: (NOTE) SARS-CoV-2 target nucleic acids are NOT DETECTED.  The SARS-CoV-2 RNA is generally detectable in upper respiratory specimens during the acute phase of infection. The lowest concentration of SARS-CoV-2 viral copies this assay can detect is 138 copies/mL. A negative result does not preclude SARS-Cov-2 infection and should not be used as the sole basis for treatment or other patient management decisions. A negative result may occur with  improper specimen collection/handling, submission of specimen other than  nasopharyngeal swab, presence of viral mutation(s) within the areas targeted by this assay, and inadequate number of viral copies(<138 copies/mL). A negative result must be combined with clinical observations, patient history, and epidemiological information. The expected result is Negative.  Fact Sheet for Patients:  EntrepreneurPulse.com.au  Fact Sheet for Healthcare Providers:  IncredibleEmployment.be  This test is no t yet approved or cleared by the [place] FDA and  has been authorized for detection and/or diagnosis of SARS-CoV-2 by FDA under an Emergency Use Authorization (EUA). This EUA will remain  in effect (meaning this test can be used) for the duration of the COVID-19 declaration under Section 564(b)(1) of the Act, 21 U.S.C.section 360bbb-3(b)(1), unless the authorization is terminated  or revoked sooner.       Influenza A by PCR NEGATIVE NEGATIVE Final   Influenza B by PCR NEGATIVE NEGATIVE Final    Comment: (NOTE) The Xpert Xpress SARS-CoV-2/FLU/RSV plus assay is intended as an aid in the diagnosis of influenza from Nasopharyngeal swab specimens and should not be used as a sole basis for treatment. Nasal washings and aspirates are unacceptable for Xpert Xpress SARS-CoV-2/FLU/RSV testing.  Fact Sheet for Patients: EntrepreneurPulse.com.au  Fact Sheet for Healthcare Providers: IncredibleEmployment.be  This test is not yet approved or cleared by the [place] FDA and has been authorized for detection and/or diagnosis of SARS-CoV-2 by FDA under an Emergency Use Authorization (EUA). This EUA will remain in effect (meaning this test can be used) for the duration of the COVID-19 declaration under Section 564(b)(1) of the Act, 21 U.S.C. section 360bbb-3(b)(1), unless the authorization is terminated or revoked.  Performed at [facility], [address] [place], [place]  [address]   MRSA PCR Screening     Status: None   Collection Time: 11/03/20 12:04 AM   Specimen: Nasal Mucosa; Nasopharyngeal  Result Value Ref Range Status   MRSA by PCR NEGATIVE NEGATIVE Final    Comment:        The GeneXpert MRSA Assay (FDA approved for NASAL specimens only), is one component of a comprehensive MRSA colonization surveillance program. It is not intended to diagnose MRSA infection nor to guide or monitor treatment for MRSA infections. Performed at [facility], [address] [place], [place] [address]     Coagulation Studies: No results for input(s): LABPROT, INR in the last 72 hours.  Urinalysis: No results for input(s): COLORURINE, LABSPEC, PHURINE, GLUCOSEU, HGBUR, BILIRUBINUR, KETONESUR, PROTEINUR, UROBILINOGEN, NITRITE, LEUKOCYTESUR in the last 72 hours.  Invalid input(s): APPERANCEUR    Imaging: DG Chest 2 View  Result Date: 11/03/2020 CLINICAL DATA:  Provided indication: kidney damage. EXAM: CHEST - 2 VIEW COMPARISON:  May 24, 2019. FINDINGS: Similar enlarged cardiac silhouette. Tortuous aorta. Similar mediastinal silhouette with chronically prominent right paratracheal stripe, likely vascular. Coronary artery stents. Median sternotomy and CABG. No consolidation. No visible pleural effusions  or pneumothorax. Polyarticular degenerative change in diffuse osteopenia. IMPRESSION: 1. No evidence of acute cardiopulmonary disease. 2. Cardiomegaly. Electronically Signed   By: Margaretha Sheffield MD   On: 11/03/2020 13:00   DG C-Arm 1-60 Min-No Report  Result Date: 11/03/2020 Fluoroscopy was utilized by the requesting physician.  No radiographic interpretation.     Medications:     amiodarone  200 mg Oral Daily   Chlorhexidine Gluconate Cloth  6 each Topical Daily   Chlorhexidine Gluconate Cloth  6 each Topical Q0600   clopidogrel  75 mg Oral Q breakfast   ferrous sulfate  325 mg Oral BID WC   furosemide  80 mg Oral BID   heparin  5,000  Units Subcutaneous Q8H   heparin sodium (porcine)  4,200 Units Intracatheter Once   insulin aspart  0-6 Units Subcutaneous TID WC   levothyroxine  100 mcg Oral Q0600   rosuvastatin  40 mg Oral Daily   acetaminophen, labetalol, melatonin, ondansetron (ZOFRAN) IV  Assessment/ Plan:  Matthew [name] is a 87 y.o.  male with past  medical history significant for ESRD on PD, essential hypertension,A. fib ,coronary artery disease status post CABG, type 2 diabetes, hypothyroidism, aortic valve regurgitation and aortic thoracic aneurysm dissection, tricuspid valve regurgitation, anemia of chronic disease, hyperlipidemia.He presented to the  ED as advised by Dr. Holley Raring , due to malfunctioning peritoneal dialysis.  ESRD on HD  Patient came into the hospital due to PD catheter malfunctioning He is receiving Furosemide PO 80 mg BID, with adequate urine output Volume and electrolyte status acceptable  Rt IJ Permcath placed 11/03/20 Dialysis tolerated well yesterday UF 1L achieved Will dialyze today to prepare for outpatient schedule Dialysis coordinator awaiting outpatient clinic acceptance  Lab Results  Component Value Date   CREATININE 3.28 (H) 11/03/2020   CREATININE 2.88 (H) 11/02/2020   CREATININE 2.91 (H) 11/01/2020    Intake/Output Summary (Last 24 hours) at 11/04/2020 1105 Last data filed at 11/04/2020 0900 Gross per 24 hour  Intake 480 ml  Output 1310 ml  Net -830 ml     #Malfunctioning of the PD catheter PD cath removed 11/03/20  #Hypertension with CKD BP stable for this patient Continue current regimen  #Diabetes with CKD SSi management per primary team  #Anemia of CKD Lab Results  Component Value Date   HGB 12.5 (L) 11/03/2020  Ferrous Sulfate orally     LOS: 3   6/14/202211:05 AM

## 2020-11-04 NOTE — Care Management Important Message (Signed)
Important Message  Patient Details  Name: ITALO BANTON MRN: [id number] Date of Birth: February 20, 1931   Medicare Important Message Given:  Yes     Dannette Barbara 11/04/2020, 11:54 AM

## 2020-11-04 NOTE — Progress Notes (Signed)
PROGRESS NOTE    Matthew [name]  TOI:[id number] DOB: [birth year] DOA: 11/01/2020 PCP: Jodi Marble, MD  Brief Narrative:  87 y.o. male with medical history significant for ESRD on PD, essential hypertension, HFrEF 45%, paroxysmal A. fib not on anticoagulation, coronary artery disease status post CABG, type 2 diabetes, hypothyroidism, aortic valve regurgitation and aortic thoracic aneurysm dissection (declines surgery), tricuspid valve regurgitation, anemia of chronic disease, hyperlipidemia admitted at the request of nephrology, Dr. Holley Raring, due to malfunctioning peritoneal dialysis  Will convert to hemodialysis.  PermCath placed by vascular surgery on 6/13.  HD on 6/14 in house.  Nephrology working on outpatient HD chair and time.   Assessment & Plan:   Active Problems:   Malposition of peritoneal dialysis catheter (HCC)  Suspected malposition of peritoneal dialysis catheter, malfunctioning, POA. Admitted at the request of nephrology Dr. Holley Raring due to malfunctioning peritoneal dialysis catheter. No reported constitutional symptoms prior to admission.   Status post PermCath placement 8/33 No complications with procedure Plan:  HD today Dialysis coordinator working on outpatient dialysis slot Discharge once nephrology team confirms outpatient HD chair and time   ESRD on PD Switching over to hemodialysis now once new permacath in place   HFrEF 45% Euvolemic on exam Resume home regimen. Volume status managed with hemodialysis. Continue p.o. Lasix and Toprol-XL. Monitor daily strict I's and O's and daily weight   Coronary artery disease status post CABG Denies any anginal symptoms Resume home regimen, on Plavix and Crestor.   Aortic regurgitation/aortic thoracic aneurysm dissection Has declined surgery Resume home Lasix, he still makes urine. Maintain BP normotensive IV antihypertensives with parameters to maintain SBP less than 130. Monitor closely on telemetry.    Paroxysmal A. fib, not on anticoagulation On amiodarone.  Stop Toprol-XL as heart rate in 50s In sinus rhythm Monitor on telemetry   Essential hypertension BP is currently stable Monitor vital signs.   Hyperlipidemia Continue Crestor   Hypothyroidism Continue home levothyroxine.   Type 2 diabetes with hyperglycemia Continue insulin sliding scale Avoid overcorrection in the setting of ESRD and hypoglycemia.   DVT prophylaxis: SQ heparin Code Status: DNR Family Communication: Spouse at bedside Disposition Plan: Status is: Inpatient  Remains inpatient appropriate because:Unsafe d/c plan  Dispo:  Patient From: Home  Planned Disposition: Home  Medically stable for discharge: Yes  Patient is medically stable for discharge at this time.  Discharge pending confirmation of outpatient HD chair and time         Level of care: Med-Surg  Consultants:  Nephrology  Procedures:  PermCath placement, 6/13  Antimicrobials:  None   Subjective: Seen and examined.  Sitting up in bed.  No visible distress.  No complaints.  Objective: Vitals:   11/04/20 1415 11/04/20 1430 11/04/20 1445 11/04/20 1500  BP: (!) 114/57 121/65 (!) 119/59 (!) 116/58  Pulse: 74 71 74 72  Resp: 17 (!) 22 (!) 21 18  Temp:      TempSrc:      SpO2:      Weight:      Height:        Intake/Output Summary (Last 24 hours) at 11/04/2020 1530 Last data filed at 11/04/2020 0900 Gross per 24 hour  Intake 480 ml  Output 1310 ml  Net -830 ml   Filed Weights   11/03/20 0507 11/03/20 1327 11/04/20 0430  Weight: 98.7 kg 98.7 kg 102.2 kg    Examination:  General exam: Appears calm and comfortable  Respiratory system: Clear to auscultation. Respiratory effort  normal. Cardiovascular system: S1 & S2 heard, RRR. No JVD, murmurs, rubs, gallops or clicks. No pedal edema. Gastrointestinal system: Abdomen is nondistended, soft and nontender. No organomegaly or masses felt. Normal bowel sounds  heard. Central nervous system: Alert and oriented. No focal neurological deficits. Extremities: Symmetric 5 x 5 power. Skin: No rashes, lesions or ulcers Psychiatry: Judgement and insight appear normal. Mood & affect appropriate.     Data Reviewed: I have personally reviewed following labs and imaging studies  CBC: Recent Labs  Lab 11/01/20 1129 11/02/20 0440 11/03/20 0421 11/04/20 1343  WBC 5.3 4.7 5.4 10.1  NEUTROABS 3.0  --   --   --   HGB 12.2* 11.8* 12.5* 11.3*  HCT 37.2* 36.0* 37.7* 33.1*  MCV 97.9 96.5 95.2 95.1  PLT 121* 113* 119* 130*   Basic Metabolic Panel: Recent Labs  Lab 11/01/20 1129 11/01/20 1803 11/02/20 0440 11/03/20 0421 11/04/20 1343  NA 137  --  138 137 136  136  K 4.6  --  4.5 4.9 4.0  4.0  CL 102  --  104 101 100  100  CO2 29  --  29 30 30  29   GLUCOSE 158*  --  148* 150* 166*  163*  BUN 50*  --  50* 54* 28*  28*  CREATININE 2.89* 2.91* 2.88* 3.28* 2.09*  2.09*  CALCIUM 8.9  --  8.9 8.9 8.4*  8.4*  PHOS  --   --  4.3 4.4 2.0*   GFR: Estimated Creatinine Clearance: 30.8 mL/min (A) (by C-G formula based on SCr of 2.09 mg/dL (H)). Liver Function Tests: Recent Labs  Lab 11/01/20 1129 11/02/20 0440 11/03/20 0421 11/04/20 1343  AST 37  --   --   --   ALT 54*  --   --   --   ALKPHOS 142*  --   --   --   BILITOT 0.6  --   --   --   PROT 6.4*  --   --   --   ALBUMIN 3.3* 3.2* 3.3* 3.2*   No results for input(s): LIPASE, AMYLASE in the last 168 hours. No results for input(s): AMMONIA in the last 168 hours. Coagulation Profile: No results for input(s): INR, PROTIME in the last 168 hours. Cardiac Enzymes: No results for input(s): CKTOTAL, CKMB, CKMBINDEX, TROPONINI in the last 168 hours. BNP (last 3 results) No results for input(s): PROBNP in the last 8760 hours. HbA1C: Recent Labs    11/01/20 1803  HGBA1C 6.3*   CBG: Recent Labs  Lab 11/03/20 1103 11/03/20 1609 11/03/20 2206 11/04/20 0728 11/04/20 1205  GLUCAP 122*  104* 194* 229* 149*   Lipid Profile: No results for input(s): CHOL, HDL, LDLCALC, TRIG, CHOLHDL, LDLDIRECT in the last 72 hours. Thyroid Function Tests: No results for input(s): TSH, T4TOTAL, FREET4, T3FREE, THYROIDAB in the last 72 hours. Anemia Panel: No results for input(s): VITAMINB12, FOLATE, FERRITIN, TIBC, IRON, RETICCTPCT in the last 72 hours. Sepsis Labs: No results for input(s): PROCALCITON, LATICACIDVEN in the last 168 hours.  Recent Results (from the past 240 hour(s))  Resp Panel by RT-PCR (Flu A&B, Covid) Nasopharyngeal Swab     Status: None   Collection Time: 11/01/20  3:43 PM   Specimen: Nasopharyngeal Swab; Nasopharyngeal(NP) swabs in vial transport medium  Result Value Ref Range Status   SARS Coronavirus 2 by RT PCR NEGATIVE NEGATIVE Final    Comment: (NOTE) SARS-CoV-2 target nucleic acids are NOT DETECTED.  The SARS-CoV-2 RNA is generally  detectable in upper respiratory specimens during the acute phase of infection. The lowest concentration of SARS-CoV-2 viral copies this assay can detect is 138 copies/mL. A negative result does not preclude SARS-Cov-2 infection and should not be used as the sole basis for treatment or other patient management decisions. A negative result may occur with  improper specimen collection/handling, submission of specimen other than nasopharyngeal swab, presence of viral mutation(s) within the areas targeted by this assay, and inadequate number of viral copies(<138 copies/mL). A negative result must be combined with clinical observations, patient history, and epidemiological information. The expected result is Negative.  Fact Sheet for Patients:  EntrepreneurPulse.com.au  Fact Sheet for Healthcare Providers:  IncredibleEmployment.be  This test is no t yet approved or cleared by the [place] FDA and  has been authorized for detection and/or diagnosis of SARS-CoV-2 by FDA under an Emergency Use  Authorization (EUA). This EUA will remain  in effect (meaning this test can be used) for the duration of the COVID-19 declaration under Section 564(b)(1) of the Act, 21 U.S.C.section 360bbb-3(b)(1), unless the authorization is terminated  or revoked sooner.       Influenza A by PCR NEGATIVE NEGATIVE Final   Influenza B by PCR NEGATIVE NEGATIVE Final    Comment: (NOTE) The Xpert Xpress SARS-CoV-2/FLU/RSV plus assay is intended as an aid in the diagnosis of influenza from Nasopharyngeal swab specimens and should not be used as a sole basis for treatment. Nasal washings and aspirates are unacceptable for Xpert Xpress SARS-CoV-2/FLU/RSV testing.  Fact Sheet for Patients: EntrepreneurPulse.com.au  Fact Sheet for Healthcare Providers: IncredibleEmployment.be  This test is not yet approved or cleared by the [place] FDA and has been authorized for detection and/or diagnosis of SARS-CoV-2 by FDA under an Emergency Use Authorization (EUA). This EUA will remain in effect (meaning this test can be used) for the duration of the COVID-19 declaration under Section 564(b)(1) of the Act, 21 U.S.C. section 360bbb-3(b)(1), unless the authorization is terminated or revoked.  Performed at [facility], [address] [place], [place] [address]   MRSA PCR Screening     Status: None   Collection Time: 11/03/20 12:04 AM   Specimen: Nasal Mucosa; Nasopharyngeal  Result Value Ref Range Status   MRSA by PCR NEGATIVE NEGATIVE Final    Comment:        The GeneXpert MRSA Assay (FDA approved for NASAL specimens only), is one component of a comprehensive MRSA colonization surveillance program. It is not intended to diagnose MRSA infection nor to guide or monitor treatment for MRSA infections. Performed at [facility], [address] [place], [place] [address]          Radiology Studies: DG Chest 2 View  Result Date:  11/03/2020 CLINICAL DATA:  Provided indication: kidney damage. EXAM: CHEST - 2 VIEW COMPARISON:  May 24, 2019. FINDINGS: Similar enlarged cardiac silhouette. Tortuous aorta. Similar mediastinal silhouette with chronically prominent right paratracheal stripe, likely vascular. Coronary artery stents. Median sternotomy and CABG. No consolidation. No visible pleural effusions or pneumothorax. Polyarticular degenerative change in diffuse osteopenia. IMPRESSION: 1. No evidence of acute cardiopulmonary disease. 2. Cardiomegaly. Electronically Signed   By: Margaretha Sheffield MD   On: 11/03/2020 13:00   DG C-Arm 1-60 Min-No Report  Result Date: 11/03/2020 Fluoroscopy was utilized by the requesting physician.  No radiographic interpretation.        Scheduled Meds:  amiodarone  200 mg Oral Daily   Chlorhexidine Gluconate Cloth  6 each Topical Daily  Chlorhexidine Gluconate Cloth  6 each Topical Q0600   clopidogrel  75 mg Oral Q breakfast   ferrous sulfate  325 mg Oral BID WC   furosemide  80 mg Oral BID   heparin  5,000 Units Subcutaneous Q8H   heparin sodium (porcine)  4,200 Units Intracatheter Once   insulin aspart  0-6 Units Subcutaneous TID WC   levothyroxine  100 mcg Oral Q0600   rosuvastatin  40 mg Oral Daily   Continuous Infusions:   LOS: 3 days    Time spent: 15 minutes    Sidney Ace, MD [facility] Pager [phone]  If 7PM-7AM, please contact night-coverage 11/04/2020, 3:30 PM

## 2020-11-04 NOTE — Progress Notes (Signed)
[facility]  Daily Progress Note   Subjective  -   No events overnight. Not much pain. For HD today  Objective Vitals:   11/03/20 2115 11/03/20 2130 11/03/20 2209 11/04/20 0430  BP: (!) 109/58 103/63 (!) 106/57 113/60  Pulse:   71 72  Resp: 18 17  20   Temp:   98 F (36.7 C) 98 F (36.7 C)  TempSrc:   Oral Oral  SpO2: 97% 98% 93% 94%  Weight:    102.2 kg  Height:        Intake/Output Summary (Last 24 hours) at 11/04/2020 0845 Last data filed at 11/04/2020 0742 Gross per 24 hour  Intake 240 ml  Output 1535 ml  Net -1295 ml    PULM  CTAB CV  RRR VASC  Permcath in place without drainage. Mild bleeding from old PD cath site.  Laboratory CBC    Component Value Date/Time   WBC 5.4 11/03/2020 0421   HGB 12.5 (L) 11/03/2020 0421   HCT 37.7 (L) 11/03/2020 0421   PLT 119 (L) 11/03/2020 0421    BMET    Component Value Date/Time   NA 137 11/03/2020 0421   K 4.9 11/03/2020 0421   CL 101 11/03/2020 0421   CO2 30 11/03/2020 0421   GLUCOSE 150 (H) 11/03/2020 0421   BUN 54 (H) 11/03/2020 0421   CREATININE 3.28 (H) 11/03/2020 0421   CALCIUM 8.9 11/03/2020 0421   GFRNONAA 18 (L) 11/03/2020 0421   GFRAA 25 (L) 05/26/2019 0503    Assessment/Planning: POD #1 s/p permcath placement and PD cath removal  OK to advance diet Ok to use permcath  Leave dressings on for 48 hours Ok for D/C from vascular standpoint   [name]  11/04/2020, 8:45 AM

## 2020-11-04 NOTE — Progress Notes (Signed)
   11/03/20 1843 11/03/20 1845 11/03/20 1900  Vitals  BP (!) 123/58 121/60 (!) 119/58  MAP (mmHg) 78 79 77  BP Location Left Arm Left Arm Left Arm  ECG Heart Rate  --   --  73  Resp  --   --  20  During Hemodialysis Assessment  Blood Flow Rate (mL/min) 300 mL/min 300 mL/min 300 mL/min  Arterial Pressure (mmHg) -110 mmHg -120 mmHg -120 mmHg  Venous Pressure (mmHg) 90 mmHg 100 mmHg 100 mmHg  Transmembrane Pressure (mmHg) 30 mmHg 30 mmHg 30 mmHg  Ultrafiltration Rate (mL/min) 550 mL/min 550 mL/min 550 mL/min  Dialysate Flow Rate (mL/min) 600 ml/min 600 ml/min 600 ml/min  Conductivity: Machine  14.1 14 14   HD Safety Checks Performed Yes Yes Yes  Dialysis Fluid Bolus Normal Saline Normal Saline Normal Saline  Bolus Amount (mL) 200 mL 0 mL 0 mL  Intra-Hemodialysis Comments Tx initiated Progressing as prescribed Progressing as prescribed    11/03/20 1915 11/03/20 1930 11/03/20 1945  Vitals  BP 114/66 121/61 114/60  MAP (mmHg) 79 79 75  BP Location Left Arm Left Arm Left Arm  ECG Heart Rate 75  --  77  Resp 16  --  19  During Hemodialysis Assessment  Blood Flow Rate (mL/min) 300 mL/min 300 mL/min 300 mL/min  Arterial Pressure (mmHg) -120 mmHg -130 mmHg -160 mmHg  Venous Pressure (mmHg) 100 mmHg 100 mmHg 160 mmHg  Transmembrane Pressure (mmHg) 30 mmHg 30 mmHg 40 mmHg  Ultrafiltration Rate (mL/min) 550 mL/min 550 mL/min 550 mL/min  Dialysate Flow Rate (mL/min) 600 ml/min 600 ml/min 600 ml/min  Conductivity: Machine  14 14 14   HD Safety Checks Performed Yes Yes Yes  Dialysis Fluid Bolus Normal Saline Normal Saline Normal Saline  Bolus Amount (mL) 0 mL 100 mL (given tobprevent clotting, added to uF GOAL) 0 mL  Intra-Hemodialysis Comments Progressing as prescribed Progressing as prescribed Progressing as prescribed

## 2020-11-04 NOTE — Progress Notes (Signed)
Patient tolerates treatment without incident. Vitals within range. CVC drsg. intact functioned well. removed 0.5 liters. Labs drawn.Patient offers no concerns. Report given to primary nurse.

## 2020-11-04 NOTE — Progress Notes (Signed)
Mobility Specialist - Progress Note   11/04/20 1059  Mobility  Activity Refused mobility  Mobility performed by Mobility specialist    Pt politely declined mobility this date, no reason specified. Pt reports just finished ambulating 3 laps in hallway prior to session.    Kathee Delton Mobility Specialist 11/04/20, 11:01 AM

## 2020-11-05 DIAGNOSIS — N186 End stage renal disease: Secondary | ICD-10-CM | POA: Diagnosis not present

## 2020-11-05 DIAGNOSIS — D509 Iron deficiency anemia, unspecified: Secondary | ICD-10-CM | POA: Diagnosis not present

## 2020-11-05 DIAGNOSIS — Z992 Dependence on renal dialysis: Secondary | ICD-10-CM | POA: Diagnosis not present

## 2020-11-05 DIAGNOSIS — N2581 Secondary hyperparathyroidism of renal origin: Secondary | ICD-10-CM | POA: Diagnosis not present

## 2020-11-05 LAB — GLUCOSE, CAPILLARY
Glucose-Capillary: 156 mg/dL — ABNORMAL HIGH (ref 70–99)
Glucose-Capillary: 170 mg/dL — ABNORMAL HIGH (ref 70–99)

## 2020-11-05 LAB — HEPATITIS B SURFACE ANTIBODY, QUANTITATIVE: Hep B S AB Quant (Post): 36.7 m[IU]/mL (ref >9.9)

## 2020-11-05 MED ORDER — CHLORHEXIDINE GLUCONATE CLOTH 2 % EX PADS: 6.0000 | MEDICATED_PAD | Freq: Every day | CUTANEOUS | Status: DC

## 2020-11-05 NOTE — Progress Notes (Signed)
Donzetta Sprung to be D/C'd Home per MD order.  Discussed prescriptions and follow up appointments with the patient. Prescriptions given to patient, medication list explained in detail. Pt verbalized understanding.  Allergies as of 11/05/2020       Reactions   Amoxicillin Diarrhea        Medication List     TAKE these medications    amiodarone 200 MG tablet Commonly known as: PACERONE Take 200 mg by mouth daily.   cholecalciferol 25 MCG (1000 UNIT) tablet Commonly known as: VITAMIN D Take 1,000 Units by mouth daily with breakfast.   clopidogrel 75 MG tablet Commonly known as: PLAVIX Take 1 tablet (75 mg total) by mouth daily with breakfast.   ezetimibe 10 MG tablet Commonly known as: ZETIA Take 10 mg by mouth daily.   ferrous sulfate 325 (65 FE) MG tablet Take 325 mg by mouth 2 (two) times daily with a meal.   Fish Oil 1000 MG Caps Take 4,000 mg by mouth daily with lunch.   fluticasone 50 MCG/ACT nasal spray Commonly known as: FLONASE Place 2 sprays into both nostrils daily as needed for allergies or rhinitis.   furosemide 40 MG tablet Commonly known as: LASIX Take 2 tablets (80 mg total) by mouth 2 (two) times daily. What changed:  how much to take when to take this   glipiZIDE 5 MG tablet Commonly known as: GLUCOTROL Take 2.5 mg by mouth daily before breakfast.   levocetirizine 5 MG tablet Commonly known as: XYZAL Take 10 mg by mouth every morning.   levothyroxine 88 MCG tablet Commonly known as: SYNTHROID Take 88 mcg by mouth daily before breakfast.   metoprolol succinate 50 MG 24 hr tablet Commonly known as: TOPROL-XL Take 50 mg by mouth daily with lunch.   multivitamin Tabs tablet Take 1 tablet by mouth daily.   nitroGLYCERIN 0.4 MG SL tablet Commonly known as: NITROSTAT Place 0.4 mg under the tongue every 5 (five) minutes as needed for chest pain.   potassium chloride SA 20 MEQ tablet Commonly known as: KLOR-CON Take 20 mEq by mouth  daily.   rosuvastatin 40 MG tablet Commonly known as: CRESTOR Take 40 mg by mouth at bedtime.        Vitals:   11/05/20 0827 11/05/20 1140  BP: (!) 117/58 (!) 119/50  Pulse: (!) 57 (!) 59  Resp: 16 16  Temp: 97.9 F (36.6 C) 97.8 F (36.6 C)  SpO2: 100% 100%    Abdominal dressing changed. IV catheter discontinued and intact. Site without signs and symptoms of complications. Dressing and pressure applied. Pt denies pain at this time. No complaints noted.  An After Visit Summary was printed and given to the patient. Patient escorted via [place], and D/C home via private auto.  Fuller Mandril, RN

## 2020-11-05 NOTE — Plan of Care (Signed)
  Problem: Health Behavior/Discharge Planning: Goal: Ability to manage health-related needs will improve Outcome: Progressing   Problem: Activity: Goal: Risk for activity intolerance will decrease Outcome: Progressing   Problem: Nutrition: Goal: Adequate nutrition will be maintained Outcome: Progressing   Problem: Elimination: Goal: Will not experience complications related to urinary retention Outcome: Progressing   Problem: Pain Managment: Goal: General experience of comfort will improve Outcome: Progressing   Problem: Safety: Goal: Ability to remain free from injury will improve Outcome: Progressing   Problem: Skin Integrity: Goal: Risk for impaired skin integrity will decrease Outcome: Progressing

## 2020-11-05 NOTE — Discharge Summary (Signed)
Physician Discharge Summary  ELVERT CUMPTON FGH:[id number] DOB: [birth year] DOA: 11/01/2020  PCP: Jodi Marble, MD  Admit date: 11/01/2020 Discharge date: 11/05/2020  Admitted From: Home Disposition: Home  Recommendations for Outpatient Follow-up:  Follow up with PCP in 1-2 weeks Follow-up with nephrology as directed  Home Health: No Equipment/Devices: Right chest permacath  Discharge Condition: Stable CODE STATUS: DNR Diet recommendation: Renal with fluid restriction  Brief/Interim Summary: 87 y.o. male with medical history significant for ESRD on PD, essential hypertension, HFrEF 45%, paroxysmal A. fib not on anticoagulation, coronary artery disease status post CABG, type 2 diabetes, hypothyroidism, aortic valve regurgitation and aortic thoracic aneurysm dissection (declines surgery), tricuspid valve regurgitation, anemia of chronic disease, hyperlipidemia admitted at the request of nephrology, Dr. Holley Raring, due to malfunctioning peritoneal dialysis   Will convert to hemodialysis.  PermCath placed by vascular surgery on 6/13.  HD on 6/14 in house.  Confirmed with nephrology consult and an outpatient HD chair and time were established and patient was discharged home in stable condition  Discharge Diagnoses:  Active Problems:   Malposition of peritoneal dialysis catheter (HCC)  Suspected malposition of peritoneal dialysis catheter, malfunctioning, POA. Admitted at the request of nephrology Dr. Holley Raring due to malfunctioning peritoneal dialysis catheter. No reported constitutional symptoms prior to admission.   Status post PermCath placement 6/78 No complications with procedure Confirmed with nephrology consultant HD chair and time were established Patient discharged home in stable condition   ESRD on PD Switching over to hemodialysis now once new permacath in place     Discharge Instructions  Discharge Instructions     Diet - low sodium heart healthy   Complete by: As  directed    Increase activity slowly   Complete by: As directed    No wound care   Complete by: As directed       Allergies as of 11/05/2020       Reactions   Amoxicillin Diarrhea        Medication List     TAKE these medications    amiodarone 200 MG tablet Commonly known as: PACERONE Take 200 mg by mouth daily.   cholecalciferol 25 MCG (1000 UNIT) tablet Commonly known as: VITAMIN D Take 1,000 Units by mouth daily with breakfast.   clopidogrel 75 MG tablet Commonly known as: PLAVIX Take 1 tablet (75 mg total) by mouth daily with breakfast.   ezetimibe 10 MG tablet Commonly known as: ZETIA Take 10 mg by mouth daily.   ferrous sulfate 325 (65 FE) MG tablet Take 325 mg by mouth 2 (two) times daily with a meal.   Fish Oil 1000 MG Caps Take 4,000 mg by mouth daily with lunch.   fluticasone 50 MCG/ACT nasal spray Commonly known as: FLONASE Place 2 sprays into both nostrils daily as needed for allergies or rhinitis.   furosemide 40 MG tablet Commonly known as: LASIX Take 2 tablets (80 mg total) by mouth 2 (two) times daily. What changed:  how much to take when to take this   glipiZIDE 5 MG tablet Commonly known as: GLUCOTROL Take 2.5 mg by mouth daily before breakfast.   levocetirizine 5 MG tablet Commonly known as: XYZAL Take 10 mg by mouth every morning.   levothyroxine 88 MCG tablet Commonly known as: SYNTHROID Take 88 mcg by mouth daily before breakfast.   metoprolol succinate 50 MG 24 hr tablet Commonly known as: TOPROL-XL Take 50 mg by mouth daily with lunch.   multivitamin Tabs tablet Take 1 tablet  by mouth daily.   nitroGLYCERIN 0.4 MG SL tablet Commonly known as: NITROSTAT Place 0.4 mg under the tongue every 5 (five) minutes as needed for chest pain.   potassium chloride SA 20 MEQ tablet Commonly known as: KLOR-CON Take 20 mEq by mouth daily.   rosuvastatin 40 MG tablet Commonly known as: CRESTOR Take 40 mg by mouth at bedtime.         Follow-up Information     Kris Hartmann, NP Follow up in 2 week(s).   Specialty: Vascular Surgery Why: For wound re-check ( left message for them to call patient back) Contact information: [place] [address] [phone]                Allergies  Allergen Reactions   Amoxicillin Diarrhea    Consultations: Nephrology   Procedures/Studies: DG Chest 2 View  Result Date: 11/03/2020 CLINICAL DATA:  Provided indication: kidney damage. EXAM: CHEST - 2 VIEW COMPARISON:  May 24, 2019. FINDINGS: Similar enlarged cardiac silhouette. Tortuous aorta. Similar mediastinal silhouette with chronically prominent right paratracheal stripe, likely vascular. Coronary artery stents. Median sternotomy and CABG. No consolidation. No visible pleural effusions or pneumothorax. Polyarticular degenerative change in diffuse osteopenia. IMPRESSION: 1. No evidence of acute cardiopulmonary disease. 2. Cardiomegaly. Electronically Signed   By: Margaretha Sheffield MD   On: 11/03/2020 13:00   DG C-Arm 1-60 Min-No Report  Result Date: 11/03/2020 Fluoroscopy was utilized by the requesting physician.  No radiographic interpretation.   (Echo, Carotid, EGD, Colonoscopy, ERCP)    Subjective: Patient seen and examined on the day of discharge.  Stable, no distress.  HD catheter in place and confirmed to function appropriately.  Discharged home in stable condition  Discharge Exam: Vitals:   11/05/20 0827 11/05/20 1140  BP: (!) 117/58 (!) 119/50  Pulse: (!) 57 (!) 59  Resp: 16 16  Temp: 97.9 F (36.6 C) 97.8 F (36.6 C)  SpO2: 100% 100%   Vitals:   11/05/20 0452 11/05/20 0453 11/05/20 0827 11/05/20 1140  BP:  (!) 113/45 (!) 117/58 (!) 119/50  Pulse:  70 (!) 57 (!) 59  Resp:  16 16 16   Temp:  98.2 F (36.8 C) 97.9 F (36.6 C) 97.8 F (36.6 C)  TempSrc:  Oral Oral Oral  SpO2:  98% 100% 100%  Weight: 98 kg     Height:        General: Pt is alert, awake, not in acute  distress Cardiovascular: RRR, S1/S2 +, no rubs, no gallops Respiratory: CTA bilaterally, no wheezing, no rhonchi Abdominal: Soft, NT, ND, bowel sounds + Extremities: no edema, no cyanosis    The results of significant diagnostics from this hospitalization (including imaging, microbiology, ancillary and laboratory) are listed below for reference.     Microbiology: Recent Results (from the past 240 hour(s))  Resp Panel by RT-PCR (Flu A&B, Covid) Nasopharyngeal Swab     Status: None   Collection Time: 11/01/20  3:43 PM   Specimen: Nasopharyngeal Swab; Nasopharyngeal(NP) swabs in vial transport medium  Result Value Ref Range Status   SARS Coronavirus 2 by RT PCR NEGATIVE NEGATIVE Final    Comment: (NOTE) SARS-CoV-2 target nucleic acids are NOT DETECTED.  The SARS-CoV-2 RNA is generally detectable in upper respiratory specimens during the acute phase of infection. The lowest concentration of SARS-CoV-2 viral copies this assay can detect is 138 copies/mL. A negative result does not preclude SARS-Cov-2 infection and should not be used as the sole basis for treatment or  other patient management decisions. A negative result may occur with  improper specimen collection/handling, submission of specimen other than nasopharyngeal swab, presence of viral mutation(s) within the areas targeted by this assay, and inadequate number of viral copies(<138 copies/mL). A negative result must be combined with clinical observations, patient history, and epidemiological information. The expected result is Negative.  Fact Sheet for Patients:  EntrepreneurPulse.com.au  Fact Sheet for Healthcare Providers:  IncredibleEmployment.be  This test is no t yet approved or cleared by the [place] FDA and  has been authorized for detection and/or diagnosis of SARS-CoV-2 by FDA under an Emergency Use Authorization (EUA). This EUA will remain  in effect (meaning this test  can be used) for the duration of the COVID-19 declaration under Section 564(b)(1) of the Act, 21 U.S.C.section 360bbb-3(b)(1), unless the authorization is terminated  or revoked sooner.       Influenza A by PCR NEGATIVE NEGATIVE Final   Influenza B by PCR NEGATIVE NEGATIVE Final    Comment: (NOTE) The Xpert Xpress SARS-CoV-2/FLU/RSV plus assay is intended as an aid in the diagnosis of influenza from Nasopharyngeal swab specimens and should not be used as a sole basis for treatment. Nasal washings and aspirates are unacceptable for Xpert Xpress SARS-CoV-2/FLU/RSV testing.  Fact Sheet for Patients: EntrepreneurPulse.com.au  Fact Sheet for Healthcare Providers: IncredibleEmployment.be  This test is not yet approved or cleared by the [place] FDA and has been authorized for detection and/or diagnosis of SARS-CoV-2 by FDA under an Emergency Use Authorization (EUA). This EUA will remain in effect (meaning this test can be used) for the duration of the COVID-19 declaration under Section 564(b)(1) of the Act, 21 U.S.C. section 360bbb-3(b)(1), unless the authorization is terminated or revoked.  Performed at [facility], [address] [place], [place] [address]   MRSA PCR Screening     Status: None   Collection Time: 11/03/20 12:04 AM   Specimen: Nasal Mucosa; Nasopharyngeal  Result Value Ref Range Status   MRSA by PCR NEGATIVE NEGATIVE Final    Comment:        The GeneXpert MRSA Assay (FDA approved for NASAL specimens only), is one component of a comprehensive MRSA colonization surveillance program. It is not intended to diagnose MRSA infection nor to guide or monitor treatment for MRSA infections. Performed at [facility], [address] [place], [place] [address]      Labs: BNP (last 3 results) No results for input(s): BNP in the last 8760 hours. Basic Metabolic Panel: Recent Labs  Lab 11/01/20 1129  11/01/20 1803 11/02/20 0440 11/03/20 0421 11/04/20 1343  NA 137  --  138 137 136  136  K 4.6  --  4.5 4.9 4.0  4.0  CL 102  --  104 101 100  100  CO2 29  --  29 30 30  29   GLUCOSE 158*  --  148* 150* 166*  163*  BUN 50*  --  50* 54* 28*  28*  CREATININE 2.89* 2.91* 2.88* 3.28* 2.09*  2.09*  CALCIUM 8.9  --  8.9 8.9 8.4*  8.4*  PHOS  --   --  4.3 4.4 2.0*   Liver Function Tests: Recent Labs  Lab 11/01/20 1129 11/02/20 0440 11/03/20 0421 11/04/20 1343  AST 37  --   --   --   ALT 54*  --   --   --   ALKPHOS 142*  --   --   --   BILITOT 0.6  --   --   --  PROT 6.4*  --   --   --   ALBUMIN 3.3* 3.2* 3.3* 3.2*   No results for input(s): LIPASE, AMYLASE in the last 168 hours. No results for input(s): AMMONIA in the last 168 hours. CBC: Recent Labs  Lab 11/01/20 1129 11/02/20 0440 11/03/20 0421 11/04/20 1343  WBC 5.3 4.7 5.4 10.1  NEUTROABS 3.0  --   --   --   HGB 12.2* 11.8* 12.5* 11.3*  HCT 37.2* 36.0* 37.7* 33.1*  MCV 97.9 96.5 95.2 95.1  PLT 121* 113* 119* 118*   Cardiac Enzymes: No results for input(s): CKTOTAL, CKMB, CKMBINDEX, TROPONINI in the last 168 hours. BNP: Invalid input(s): POCBNP CBG: Recent Labs  Lab 11/04/20 1205 11/04/20 1617 11/04/20 2032 11/05/20 0740 11/05/20 1141  GLUCAP 149* 143* 167* 156* 170*   D-Dimer No results for input(s): DDIMER in the last 72 hours. Hgb A1c No results for input(s): HGBA1C in the last 72 hours. Lipid Profile No results for input(s): CHOL, HDL, LDLCALC, TRIG, CHOLHDL, LDLDIRECT in the last 72 hours. Thyroid function studies No results for input(s): TSH, T4TOTAL, T3FREE, THYROIDAB in the last 72 hours.  Invalid input(s): FREET3 Anemia work up No results for input(s): VITAMINB12, FOLATE, FERRITIN, TIBC, IRON, RETICCTPCT in the last 72 hours. Urinalysis    Component Value Date/Time   COLORURINE YELLOW (A) 05/24/2019 0021   APPEARANCEUR CLEAR (A) 05/24/2019 0021   LABSPEC 1.012 05/24/2019 0021    PHURINE 6.0 05/24/2019 0021   GLUCOSEU NEGATIVE 05/24/2019 0021   HGBUR NEGATIVE 05/24/2019 0021   BILIRUBINUR NEGATIVE 05/24/2019 0021   KETONESUR NEGATIVE 05/24/2019 0021   PROTEINUR 30 (A) 05/24/2019 0021   NITRITE NEGATIVE 05/24/2019 0021   LEUKOCYTESUR NEGATIVE 05/24/2019 0021   Sepsis Labs Invalid input(s): PROCALCITONIN,  WBC,  LACTICIDVEN Microbiology Recent Results (from the past 240 hour(s))  Resp Panel by RT-PCR (Flu A&B, Covid) Nasopharyngeal Swab     Status: None   Collection Time: 11/01/20  3:43 PM   Specimen: Nasopharyngeal Swab; Nasopharyngeal(NP) swabs in vial transport medium  Result Value Ref Range Status   SARS Coronavirus 2 by RT PCR NEGATIVE NEGATIVE Final    Comment: (NOTE) SARS-CoV-2 target nucleic acids are NOT DETECTED.  The SARS-CoV-2 RNA is generally detectable in upper respiratory specimens during the acute phase of infection. The lowest concentration of SARS-CoV-2 viral copies this assay can detect is 138 copies/mL. A negative result does not preclude SARS-Cov-2 infection and should not be used as the sole basis for treatment or other patient management decisions. A negative result may occur with  improper specimen collection/handling, submission of specimen other than nasopharyngeal swab, presence of viral mutation(s) within the areas targeted by this assay, and inadequate number of viral copies(<138 copies/mL). A negative result must be combined with clinical observations, patient history, and epidemiological information. The expected result is Negative.  Fact Sheet for Patients:  EntrepreneurPulse.com.au  Fact Sheet for Healthcare Providers:  IncredibleEmployment.be  This test is no t yet approved or cleared by the [place] FDA and  has been authorized for detection and/or diagnosis of SARS-CoV-2 by FDA under an Emergency Use Authorization (EUA). This EUA will remain  in effect (meaning this test can  be used) for the duration of the COVID-19 declaration under Section 564(b)(1) of the Act, 21 U.S.C.section 360bbb-3(b)(1), unless the authorization is terminated  or revoked sooner.       Influenza A by PCR NEGATIVE NEGATIVE Final   Influenza B by PCR NEGATIVE NEGATIVE Final  Comment: (NOTE) The Xpert Xpress SARS-CoV-2/FLU/RSV plus assay is intended as an aid in the diagnosis of influenza from Nasopharyngeal swab specimens and should not be used as a sole basis for treatment. Nasal washings and aspirates are unacceptable for Xpert Xpress SARS-CoV-2/FLU/RSV testing.  Fact Sheet for Patients: EntrepreneurPulse.com.au  Fact Sheet for Healthcare Providers: IncredibleEmployment.be  This test is not yet approved or cleared by the [place] FDA and has been authorized for detection and/or diagnosis of SARS-CoV-2 by FDA under an Emergency Use Authorization (EUA). This EUA will remain in effect (meaning this test can be used) for the duration of the COVID-19 declaration under Section 564(b)(1) of the Act, 21 U.S.C. section 360bbb-3(b)(1), unless the authorization is terminated or revoked.  Performed at [facility], [address] [place], [place] [address]   MRSA PCR Screening     Status: None   Collection Time: 11/03/20 12:04 AM   Specimen: Nasal Mucosa; Nasopharyngeal  Result Value Ref Range Status   MRSA by PCR NEGATIVE NEGATIVE Final    Comment:        The GeneXpert MRSA Assay (FDA approved for NASAL specimens only), is one component of a comprehensive MRSA colonization surveillance program. It is not intended to diagnose MRSA infection nor to guide or monitor treatment for MRSA infections. Performed at [facility], [address] [place],  [address]      Time coordinating discharge: Over 30 minutes  SIGNED:   Sidney Ace, MD  [facility] 11/05/2020, 1:07 PM Pager   If  7PM-7AM, please contact night-coverage

## 2020-11-05 NOTE — Progress Notes (Signed)
[facility]  ROUNDING NOTE   Subjective:  Matthew [name] is a 87 years old male . He has past  medical history significant for ESRD on PD, essential hypertension,A. fib ,coronary artery disease status post CABG, type 2 diabetes, hypothyroidism, aortic valve regurgitation and aortic thoracic aneurysm dissection, tricuspid valve regurgitation, anemia of chronic disease, hyperlipidemia. He presented to the  ED as advised by Dr. Holley Raring , due to malfunctioning peritoneal dialysis  Patient seen resting in bed Alert and oriented Tolerating meals Dialysis went well yesterday   Objective:  Vital signs in last 24 hours:  Temp:  [97.6 F (36.4 C)-98.5 F (36.9 C)] 97.9 F (36.6 C) (06/15 0827) Pulse Rate:  [57-75] 57 (06/15 0827) Resp:  [16-22] 16 (06/15 0827) BP: (110-126)/(45-65) 117/58 (06/15 0827) SpO2:  [95 %-100 %] 100 % (06/15 0827) Weight:  [98 kg] 98 kg (06/15 0452)  Weight change: -0.678 kg Filed Weights   11/03/20 1327 11/04/20 0430 11/05/20 0452  Weight: 98.7 kg 102.2 kg 98 kg    Intake/Output: I/O last 3 completed shifts: In: 480 [P.O.:480] Out: 2025 [Urine:525; Other:1500]   Intake/Output this shift:  Total I/O In: 480 [P.O.:480] Out: 100 [Urine:100]  Physical Exam: General: No acute distress  Head: Normocephalic, atraumatic. Moist oral mucosal membranes  Eyes: Anicteric  Lungs:  Clear to auscultation bilaterally, Normal and symmetrical respiratory effort  Heart: Regular rate and rhythm  Abdomen:  Soft, nontender,slightly distended  Extremities:  Trace peripheral edema.  Neurologic: Awake, alert,oriented, moving all four extremities  Skin: No acute lesions or rashes, abdominal dressing wound, old drainage  Access: Rt IJ Permcath (11/03/20)    Basic Metabolic Panel: Recent Labs  Lab 11/01/20 1129 11/01/20 1803 11/02/20 0440 11/03/20 0421 11/04/20 1343  NA 137  --  138 137 136  136  K 4.6  --  4.5 4.9 4.0  4.0  CL 102  --  104 101 100   100  CO2 29  --  29 30 30  29   GLUCOSE 158*  --  148* 150* 166*  163*  BUN 50*  --  50* 54* 28*  28*  CREATININE 2.89* 2.91* 2.88* 3.28* 2.09*  2.09*  CALCIUM 8.9  --  8.9 8.9 8.4*  8.4*  PHOS  --   --  4.3 4.4 2.0*     Liver Function Tests: Recent Labs  Lab 11/01/20 1129 11/02/20 0440 11/03/20 0421 11/04/20 1343  AST 37  --   --   --   ALT 54*  --   --   --   ALKPHOS 142*  --   --   --   BILITOT 0.6  --   --   --   PROT 6.4*  --   --   --   ALBUMIN 3.3* 3.2* 3.3* 3.2*    No results for input(s): LIPASE, AMYLASE in the last 168 hours. No results for input(s): AMMONIA in the last 168 hours.  CBC: Recent Labs  Lab 11/01/20 1129 11/02/20 0440 11/03/20 0421 11/04/20 1343  WBC 5.3 4.7 5.4 10.1  NEUTROABS 3.0  --   --   --   HGB 12.2* 11.8* 12.5* 11.3*  HCT 37.2* 36.0* 37.7* 33.1*  MCV 97.9 96.5 95.2 95.1  PLT 121* 113* 119* 118*     Cardiac Enzymes: No results for input(s): CKTOTAL, CKMB, CKMBINDEX, TROPONINI in the last 168 hours.  BNP: Invalid input(s): POCBNP  CBG: Recent Labs  Lab 11/04/20 0728 11/04/20 1205 11/04/20 1617 11/04/20 2032  11/05/20 0740  GLUCAP 229* 149* 143* 167* 156*     Microbiology: Results for orders placed or performed during the hospital encounter of 11/01/20  Resp Panel by RT-PCR (Flu A&B, Covid) Nasopharyngeal Swab     Status: None   Collection Time: 11/01/20  3:43 PM   Specimen: Nasopharyngeal Swab; Nasopharyngeal(NP) swabs in vial transport medium  Result Value Ref Range Status   SARS Coronavirus 2 by RT PCR NEGATIVE NEGATIVE Final    Comment: (NOTE) SARS-CoV-2 target nucleic acids are NOT DETECTED.  The SARS-CoV-2 RNA is generally detectable in upper respiratory specimens during the acute phase of infection. The lowest concentration of SARS-CoV-2 viral copies this assay can detect is 138 copies/mL. A negative result does not preclude SARS-Cov-2 infection and should not be used as the sole basis for treatment  or other patient management decisions. A negative result may occur with  improper specimen collection/handling, submission of specimen other than nasopharyngeal swab, presence of viral mutation(s) within the areas targeted by this assay, and inadequate number of viral copies(<138 copies/mL). A negative result must be combined with clinical observations, patient history, and epidemiological information. The expected result is Negative.  Fact Sheet for Patients:  EntrepreneurPulse.com.au  Fact Sheet for Healthcare Providers:  IncredibleEmployment.be  This test is no t yet approved or cleared by the [place] FDA and  has been authorized for detection and/or diagnosis of SARS-CoV-2 by FDA under an Emergency Use Authorization (EUA). This EUA will remain  in effect (meaning this test can be used) for the duration of the COVID-19 declaration under Section 564(b)(1) of the Act, 21 U.S.C.section 360bbb-3(b)(1), unless the authorization is terminated  or revoked sooner.       Influenza A by PCR NEGATIVE NEGATIVE Final   Influenza B by PCR NEGATIVE NEGATIVE Final    Comment: (NOTE) The Xpert Xpress SARS-CoV-2/FLU/RSV plus assay is intended as an aid in the diagnosis of influenza from Nasopharyngeal swab specimens and should not be used as a sole basis for treatment. Nasal washings and aspirates are unacceptable for Xpert Xpress SARS-CoV-2/FLU/RSV testing.  Fact Sheet for Patients: EntrepreneurPulse.com.au  Fact Sheet for Healthcare Providers: IncredibleEmployment.be  This test is not yet approved or cleared by the [place] FDA and has been authorized for detection and/or diagnosis of SARS-CoV-2 by FDA under an Emergency Use Authorization (EUA). This EUA will remain in effect (meaning this test can be used) for the duration of the COVID-19 declaration under Section 564(b)(1) of the Act, 21 U.S.C. section  360bbb-3(b)(1), unless the authorization is terminated or revoked.  Performed at [facility], [address] [place], [place] [address]   MRSA PCR Screening     Status: None   Collection Time: 11/03/20 12:04 AM   Specimen: Nasal Mucosa; Nasopharyngeal  Result Value Ref Range Status   MRSA by PCR NEGATIVE NEGATIVE Final    Comment:        The GeneXpert MRSA Assay (FDA approved for NASAL specimens only), is one component of a comprehensive MRSA colonization surveillance program. It is not intended to diagnose MRSA infection nor to guide or monitor treatment for MRSA infections. Performed at [facility], [address] [place], [place] [address]     Coagulation Studies: No results for input(s): LABPROT, INR in the last 72 hours.  Urinalysis: No results for input(s): COLORURINE, LABSPEC, PHURINE, GLUCOSEU, HGBUR, BILIRUBINUR, KETONESUR, PROTEINUR, UROBILINOGEN, NITRITE, LEUKOCYTESUR in the last 72 hours.  Invalid input(s): APPERANCEUR    Imaging: DG Chest 2 View  Result Date: 11/03/2020 CLINICAL DATA:  Provided indication: kidney damage. EXAM: CHEST - 2 VIEW COMPARISON:  May 24, 2019. FINDINGS: Similar enlarged cardiac silhouette. Tortuous aorta. Similar mediastinal silhouette with chronically prominent right paratracheal stripe, likely vascular. Coronary artery stents. Median sternotomy and CABG. No consolidation. No visible pleural effusions or pneumothorax. Polyarticular degenerative change in diffuse osteopenia. IMPRESSION: 1. No evidence of acute cardiopulmonary disease. 2. Cardiomegaly. Electronically Signed   By: Margaretha Sheffield MD   On: 11/03/2020 13:00   DG C-Arm 1-60 Min-No Report  Result Date: 11/03/2020 Fluoroscopy was utilized by the requesting physician.  No radiographic interpretation.     Medications:     amiodarone  200 mg Oral Daily   Chlorhexidine Gluconate Cloth  6 each Topical Q0600   clopidogrel  75 mg Oral Q breakfast    ferrous sulfate  325 mg Oral BID WC   furosemide  80 mg Oral BID   heparin  5,000 Units Subcutaneous Q8H   heparin sodium (porcine)  4,200 Units Intracatheter Once   insulin aspart  0-6 Units Subcutaneous TID WC   levothyroxine  100 mcg Oral Q0600   polyethylene glycol  17 g Oral Daily   rosuvastatin  40 mg Oral Daily   senna-docusate  1 tablet Oral BID   acetaminophen, labetalol, melatonin, ondansetron (ZOFRAN) IV  Assessment/ Plan:  Matthew [name] is a 87 y.o.  male with past  medical history significant for ESRD on PD, essential hypertension,A. fib ,coronary artery disease status post CABG, type 2 diabetes, hypothyroidism, aortic valve regurgitation and aortic thoracic aneurysm dissection, tricuspid valve regurgitation, anemia of chronic disease, hyperlipidemia.He presented to the  ED as advised by Dr. Holley Raring , due to malfunctioning peritoneal dialysis.  ESRD on HD  Patient came into the hospital due to PD catheter malfunctioning He is receiving Furosemide PO 80 mg BID, with adequate urine output Volume and electrolyte status acceptable  Rt IJ Permcath placed 11/03/20 Received dialysis yesterday Tolerated well Outpatient clinic will be [address], TTS at 11:15. Patient can start tomorrow Cleared to discharge from renal standpoint and resume treatments at outpatient clinic  Lab Results  Component Value Date   CREATININE 2.09 (H) 11/04/2020   CREATININE 2.09 (H) 11/04/2020   CREATININE 3.28 (H) 11/03/2020    Intake/Output Summary (Last 24 hours) at 11/05/2020 1105 Last data filed at 11/05/2020 1006 Gross per 24 hour  Intake 720 ml  Output 825 ml  Net -105 ml     #Malfunctioning of the PD catheter PD cath removed 11/03/20 Appreciate surgery  #Hypertension BP stable Continue current regimen  #Diabetes with CKD Glucose stable SSi management per primary team  #Anemia of CKD Lab Results  Component Value Date   HGB 11.3 (L) 11/04/2020  Ferrous Sulfate  orally Hgb above target    LOS: 4   6/15/202211:05 AM

## 2020-11-06 DIAGNOSIS — N186 End stage renal disease: Secondary | ICD-10-CM | POA: Diagnosis not present

## 2020-11-06 DIAGNOSIS — Z992 Dependence on renal dialysis: Secondary | ICD-10-CM | POA: Diagnosis not present

## 2020-11-06 DIAGNOSIS — N2581 Secondary hyperparathyroidism of renal origin: Secondary | ICD-10-CM | POA: Diagnosis not present

## 2020-11-06 DIAGNOSIS — D631 Anemia in chronic kidney disease: Secondary | ICD-10-CM | POA: Diagnosis not present

## 2020-11-06 DIAGNOSIS — D509 Iron deficiency anemia, unspecified: Secondary | ICD-10-CM | POA: Diagnosis not present

## 2020-11-06 LAB — HEPATITIS B DNA, ULTRAQUANTITATIVE, PCR
HBV DNA SERPL PCR-ACNC: NOT DETECTED IU/mL
HBV DNA SERPL PCR-LOG IU: UNDETERMINED log10 IU/mL

## 2020-11-07 DIAGNOSIS — N186 End stage renal disease: Secondary | ICD-10-CM | POA: Diagnosis not present

## 2020-11-07 DIAGNOSIS — Z992 Dependence on renal dialysis: Secondary | ICD-10-CM | POA: Diagnosis not present

## 2020-11-07 DIAGNOSIS — D509 Iron deficiency anemia, unspecified: Secondary | ICD-10-CM | POA: Diagnosis not present

## 2020-11-07 DIAGNOSIS — N2581 Secondary hyperparathyroidism of renal origin: Secondary | ICD-10-CM | POA: Diagnosis not present

## 2020-11-19 ENCOUNTER — Encounter (INDEPENDENT_AMBULATORY_CARE_PROVIDER_SITE_OTHER): Payer: Self-pay | Admitting: Nurse Practitioner

## 2020-11-19 ENCOUNTER — Ambulatory Visit (INDEPENDENT_AMBULATORY_CARE_PROVIDER_SITE_OTHER): Payer: Medicare Other | Admitting: Nurse Practitioner

## 2020-11-19 DIAGNOSIS — E1122 Type 2 diabetes mellitus with diabetic chronic kidney disease: Secondary | ICD-10-CM | POA: Diagnosis not present

## 2020-11-19 DIAGNOSIS — E032 Hypothyroidism due to medicaments and other exogenous substances: Secondary | ICD-10-CM | POA: Diagnosis not present

## 2020-11-20 DIAGNOSIS — Z992 Dependence on renal dialysis: Secondary | ICD-10-CM | POA: Diagnosis not present

## 2020-11-20 DIAGNOSIS — N186 End stage renal disease: Secondary | ICD-10-CM | POA: Diagnosis not present

## 2020-11-22 DIAGNOSIS — N2581 Secondary hyperparathyroidism of renal origin: Secondary | ICD-10-CM | POA: Diagnosis not present

## 2020-11-22 DIAGNOSIS — N186 End stage renal disease: Secondary | ICD-10-CM | POA: Diagnosis not present

## 2020-11-22 DIAGNOSIS — Z992 Dependence on renal dialysis: Secondary | ICD-10-CM | POA: Diagnosis not present

## 2020-11-25 DIAGNOSIS — Z9861 Coronary angioplasty status: Secondary | ICD-10-CM | POA: Diagnosis not present

## 2020-11-25 DIAGNOSIS — Z992 Dependence on renal dialysis: Secondary | ICD-10-CM | POA: Diagnosis not present

## 2020-11-25 DIAGNOSIS — I10 Essential (primary) hypertension: Secondary | ICD-10-CM | POA: Diagnosis not present

## 2020-11-25 DIAGNOSIS — N2581 Secondary hyperparathyroidism of renal origin: Secondary | ICD-10-CM | POA: Diagnosis not present

## 2020-11-25 DIAGNOSIS — I2510 Atherosclerotic heart disease of native coronary artery without angina pectoris: Secondary | ICD-10-CM | POA: Diagnosis not present

## 2020-11-25 DIAGNOSIS — N186 End stage renal disease: Secondary | ICD-10-CM | POA: Diagnosis not present

## 2020-11-25 DIAGNOSIS — I25810 Atherosclerosis of coronary artery bypass graft(s) without angina pectoris: Secondary | ICD-10-CM | POA: Diagnosis not present

## 2020-11-25 DIAGNOSIS — E663 Overweight: Secondary | ICD-10-CM | POA: Diagnosis not present

## 2020-11-25 DIAGNOSIS — E785 Hyperlipidemia, unspecified: Secondary | ICD-10-CM | POA: Diagnosis not present

## 2020-11-27 DIAGNOSIS — Z992 Dependence on renal dialysis: Secondary | ICD-10-CM | POA: Diagnosis not present

## 2020-11-27 DIAGNOSIS — N186 End stage renal disease: Secondary | ICD-10-CM | POA: Diagnosis not present

## 2020-11-27 DIAGNOSIS — N2581 Secondary hyperparathyroidism of renal origin: Secondary | ICD-10-CM | POA: Diagnosis not present

## 2020-11-29 DIAGNOSIS — N186 End stage renal disease: Secondary | ICD-10-CM | POA: Diagnosis not present

## 2020-11-29 DIAGNOSIS — N2581 Secondary hyperparathyroidism of renal origin: Secondary | ICD-10-CM | POA: Diagnosis not present

## 2020-11-29 DIAGNOSIS — Z992 Dependence on renal dialysis: Secondary | ICD-10-CM | POA: Diagnosis not present

## 2020-12-02 DIAGNOSIS — L89159 Pressure ulcer of sacral region, unspecified stage: Secondary | ICD-10-CM | POA: Diagnosis not present

## 2020-12-02 DIAGNOSIS — I4891 Unspecified atrial fibrillation: Secondary | ICD-10-CM | POA: Diagnosis not present

## 2020-12-02 DIAGNOSIS — N186 End stage renal disease: Secondary | ICD-10-CM | POA: Diagnosis not present

## 2020-12-02 DIAGNOSIS — E785 Hyperlipidemia, unspecified: Secondary | ICD-10-CM | POA: Diagnosis not present

## 2020-12-02 DIAGNOSIS — D649 Anemia, unspecified: Secondary | ICD-10-CM | POA: Diagnosis not present

## 2020-12-02 DIAGNOSIS — G4700 Insomnia, unspecified: Secondary | ICD-10-CM | POA: Diagnosis not present

## 2020-12-02 DIAGNOSIS — I2510 Atherosclerotic heart disease of native coronary artery without angina pectoris: Secondary | ICD-10-CM | POA: Diagnosis not present

## 2020-12-02 DIAGNOSIS — I351 Nonrheumatic aortic (valve) insufficiency: Secondary | ICD-10-CM | POA: Diagnosis not present

## 2020-12-02 DIAGNOSIS — I498 Other specified cardiac arrhythmias: Secondary | ICD-10-CM | POA: Diagnosis not present

## 2020-12-02 DIAGNOSIS — E032 Hypothyroidism due to medicaments and other exogenous substances: Secondary | ICD-10-CM | POA: Diagnosis not present

## 2020-12-02 DIAGNOSIS — N2581 Secondary hyperparathyroidism of renal origin: Secondary | ICD-10-CM | POA: Diagnosis not present

## 2020-12-02 DIAGNOSIS — E1122 Type 2 diabetes mellitus with diabetic chronic kidney disease: Secondary | ICD-10-CM | POA: Diagnosis not present

## 2020-12-02 DIAGNOSIS — Z992 Dependence on renal dialysis: Secondary | ICD-10-CM | POA: Diagnosis not present

## 2020-12-02 DIAGNOSIS — I10 Essential (primary) hypertension: Secondary | ICD-10-CM | POA: Diagnosis not present

## 2020-12-04 DIAGNOSIS — Z992 Dependence on renal dialysis: Secondary | ICD-10-CM | POA: Diagnosis not present

## 2020-12-04 DIAGNOSIS — N2581 Secondary hyperparathyroidism of renal origin: Secondary | ICD-10-CM | POA: Diagnosis not present

## 2020-12-04 DIAGNOSIS — N186 End stage renal disease: Secondary | ICD-10-CM | POA: Diagnosis not present

## 2020-12-05 DIAGNOSIS — I10 Essential (primary) hypertension: Secondary | ICD-10-CM | POA: Diagnosis not present

## 2020-12-05 DIAGNOSIS — I25810 Atherosclerosis of coronary artery bypass graft(s) without angina pectoris: Secondary | ICD-10-CM | POA: Diagnosis not present

## 2020-12-05 DIAGNOSIS — I2510 Atherosclerotic heart disease of native coronary artery without angina pectoris: Secondary | ICD-10-CM | POA: Diagnosis not present

## 2020-12-05 DIAGNOSIS — E663 Overweight: Secondary | ICD-10-CM | POA: Diagnosis not present

## 2020-12-05 DIAGNOSIS — E785 Hyperlipidemia, unspecified: Secondary | ICD-10-CM | POA: Diagnosis not present

## 2020-12-05 DIAGNOSIS — I340 Nonrheumatic mitral (valve) insufficiency: Secondary | ICD-10-CM | POA: Diagnosis not present

## 2020-12-05 DIAGNOSIS — Z9861 Coronary angioplasty status: Secondary | ICD-10-CM | POA: Diagnosis not present

## 2020-12-05 DIAGNOSIS — I351 Nonrheumatic aortic (valve) insufficiency: Secondary | ICD-10-CM | POA: Diagnosis not present

## 2020-12-05 DIAGNOSIS — R0602 Shortness of breath: Secondary | ICD-10-CM | POA: Diagnosis not present

## 2020-12-06 DIAGNOSIS — N2581 Secondary hyperparathyroidism of renal origin: Secondary | ICD-10-CM | POA: Diagnosis not present

## 2020-12-06 DIAGNOSIS — Z992 Dependence on renal dialysis: Secondary | ICD-10-CM | POA: Diagnosis not present

## 2020-12-06 DIAGNOSIS — N186 End stage renal disease: Secondary | ICD-10-CM | POA: Diagnosis not present

## 2020-12-08 DIAGNOSIS — I340 Nonrheumatic mitral (valve) insufficiency: Secondary | ICD-10-CM | POA: Diagnosis not present

## 2020-12-08 DIAGNOSIS — I10 Essential (primary) hypertension: Secondary | ICD-10-CM | POA: Diagnosis not present

## 2020-12-08 DIAGNOSIS — R0602 Shortness of breath: Secondary | ICD-10-CM | POA: Diagnosis not present

## 2020-12-08 DIAGNOSIS — I25810 Atherosclerosis of coronary artery bypass graft(s) without angina pectoris: Secondary | ICD-10-CM | POA: Diagnosis not present

## 2020-12-08 DIAGNOSIS — E785 Hyperlipidemia, unspecified: Secondary | ICD-10-CM | POA: Diagnosis not present

## 2020-12-08 DIAGNOSIS — Z9861 Coronary angioplasty status: Secondary | ICD-10-CM | POA: Diagnosis not present

## 2020-12-08 DIAGNOSIS — I2510 Atherosclerotic heart disease of native coronary artery without angina pectoris: Secondary | ICD-10-CM | POA: Diagnosis not present

## 2020-12-08 DIAGNOSIS — I351 Nonrheumatic aortic (valve) insufficiency: Secondary | ICD-10-CM | POA: Diagnosis not present

## 2020-12-08 DIAGNOSIS — E663 Overweight: Secondary | ICD-10-CM | POA: Diagnosis not present

## 2020-12-09 ENCOUNTER — Other Ambulatory Visit (INDEPENDENT_AMBULATORY_CARE_PROVIDER_SITE_OTHER): Payer: Self-pay | Admitting: Nurse Practitioner

## 2020-12-09 DIAGNOSIS — N186 End stage renal disease: Secondary | ICD-10-CM

## 2020-12-09 DIAGNOSIS — Z992 Dependence on renal dialysis: Secondary | ICD-10-CM | POA: Diagnosis not present

## 2020-12-09 DIAGNOSIS — N2581 Secondary hyperparathyroidism of renal origin: Secondary | ICD-10-CM | POA: Diagnosis not present

## 2020-12-11 DIAGNOSIS — N2581 Secondary hyperparathyroidism of renal origin: Secondary | ICD-10-CM | POA: Diagnosis not present

## 2020-12-11 DIAGNOSIS — N186 End stage renal disease: Secondary | ICD-10-CM | POA: Diagnosis not present

## 2020-12-11 DIAGNOSIS — Z992 Dependence on renal dialysis: Secondary | ICD-10-CM | POA: Diagnosis not present

## 2020-12-12 ENCOUNTER — Other Ambulatory Visit: Payer: Self-pay

## 2020-12-12 ENCOUNTER — Ambulatory Visit (INDEPENDENT_AMBULATORY_CARE_PROVIDER_SITE_OTHER): Payer: Medicare Other | Admitting: Nurse Practitioner

## 2020-12-12 ENCOUNTER — Ambulatory Visit (INDEPENDENT_AMBULATORY_CARE_PROVIDER_SITE_OTHER): Payer: Medicare Other

## 2020-12-12 ENCOUNTER — Encounter (INDEPENDENT_AMBULATORY_CARE_PROVIDER_SITE_OTHER): Payer: Self-pay | Admitting: Nurse Practitioner

## 2020-12-12 VITALS — BP 121/69 | HR 67 | Resp 16 | Wt 216.4 lb

## 2020-12-12 DIAGNOSIS — N186 End stage renal disease: Secondary | ICD-10-CM | POA: Diagnosis not present

## 2020-12-12 DIAGNOSIS — E785 Hyperlipidemia, unspecified: Secondary | ICD-10-CM

## 2020-12-13 ENCOUNTER — Encounter (INDEPENDENT_AMBULATORY_CARE_PROVIDER_SITE_OTHER): Payer: Self-pay | Admitting: Nurse Practitioner

## 2020-12-13 DIAGNOSIS — Z992 Dependence on renal dialysis: Secondary | ICD-10-CM | POA: Diagnosis not present

## 2020-12-13 DIAGNOSIS — N2581 Secondary hyperparathyroidism of renal origin: Secondary | ICD-10-CM | POA: Diagnosis not present

## 2020-12-13 DIAGNOSIS — N186 End stage renal disease: Secondary | ICD-10-CM | POA: Diagnosis not present

## 2020-12-13 NOTE — Progress Notes (Signed)
Subjective:    Patient ID: Matthew [name], male    DOB: Nov 26, 1930, 87 y.o.   MRN: [id number] Chief Complaint  Patient presents with   Follow-up    [facility] 2 wk post insertion of port a cath    Meredith Mells is an 87 year old male that is seen for evaluation for dialysis access.  The patient has recently began hemodialysis with utilization of a PermCath.  He denies any issues with his PermCath currently.  The patient is right-handed.  The patient has been considering the various methods of dialysis and wishes to proceed with hemodialysis and therefore creation of AV access.  The patient denies amaurosis fugax or recent TIA symptoms. There are no recent neurological changes noted. The patient denies claudication symptoms or rest pain symptoms. The patient denies history of DVT, PE or superficial thrombophlebitis. The patient denies recent episodes of angina or shortness of breath.    Noninvasive studies show adequate access for creation of a left brachial axillary AV graft.   Review of Systems  Respiratory:  Negative for chest tightness and shortness of breath.   All other systems reviewed and are negative.     Objective:   Physical Exam Vitals reviewed.  HENT:     Head: Normocephalic.  Cardiovascular:     Rate and Rhythm: Normal rate.     Pulses:          Radial pulses are 2+ on the right side and 2+ on the left side.  Pulmonary:     Effort: Pulmonary effort is normal.  Skin:    General: Skin is warm and dry.  Neurological:     Mental Status: He is alert and oriented to person, place, and time.     Motor: Weakness present.     Gait: Gait abnormal.  Psychiatric:        Mood and Affect: Mood normal.        Behavior: Behavior normal.        Thought Content: Thought content normal.        Judgment: Judgment normal.    BP 121/69 (BP Location: Right Arm)   Pulse 67   Resp 16   Wt 216 lb 6.4 oz (98.2 kg)   BMI 29.35 kg/m   Past Medical History:  Diagnosis Date    Anemia    Arthritis    Broken arm 05/2013   Left   CHF (congestive heart failure) (HCC)    Chronic kidney disease    Coronary artery disease    Diabetes mellitus without complication (HCC)    Dyspnea    DOE   Edema    FEET/LEGS OCCAS   Hypertension    Myocardial infarction ([place])    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   left frontal scalp, [facility]   Squamous cell carcinoma of skin 05/05/2020   left temporal scalp, in situ, [facility] 05/13/20    Social History   Socioeconomic History   Marital status: Married    Spouse name: Not on file   Number of children: Not on file   Years of education: Not on file   Highest education level: Not on file  Occupational History   Not on file  Tobacco Use   Smoking status: Former    Types: Cigarettes   Smokeless tobacco: Never   Tobacco comments:    quit 30 yrs ago, smoked 14-15 yrs, mainly pipe  Vaping Use   Vaping Use: Never used  Substance and Sexual Activity  Alcohol use: Yes    Alcohol/week: 1.0 standard drink    Types: 1 Glasses of wine per week    Comment: occasionally drinking only   Drug use: No   Sexual activity: Never  Other Topics Concern   Not on file  Social History Narrative   Not on file   Social Determinants of Health   Financial Resource Strain: Not on file  Food Insecurity: Not on file  Transportation Needs: Not on file  Physical Activity: Not on file  Stress: Not on file  Social Connections: Not on file  Intimate Partner Violence: Not on file    Past Surgical History:  Procedure Laterality Date   CATARACT EXTRACTION W/PHACO Left 07/26/2017   Procedure: CATARACT EXTRACTION PHACO AND INTRAOCULAR LENS PLACEMENT ([place]);  Surgeon: Birder Robson, MD;  Location: [facility] ORS;  Service: Ophthalmology;  Laterality: Left;  [place]   00:45.8 AP%  13.1 CDE  6.00 Fluid Pack Lot # [id number]   CATARACT EXTRACTION W/PHACO Right 08/17/2017   Procedure: CATARACT EXTRACTION PHACO AND INTRAOCULAR LENS PLACEMENT ([facility]);  Surgeon:  Birder Robson, MD;  Location: [facility] ORS;  Service: Ophthalmology;  Laterality: Right;  [place]  01:30 AP% 16.8 CDE 15.24 Fluid pack lot # [id number]   CHOLECYSTECTOMY     CORONARY ANGIOPLASTY     STENTS   CORONARY ARTERY BYPASS GRAFT     DIALYSIS/PERMA CATHETER INSERTION N/A 02/09/2018   Procedure: DIALYSIS/PERMA CATHETER INSERTION;  Surgeon: Algernon Huxley, MD;  Location: [facility];  Service: Cardiovascular;  Laterality: N/A;   FRACTURE SURGERY     ARM   INSERTION OF DIALYSIS CATHETER Right 11/03/2020   Procedure: INSERTION OF Perm Cath in the RIGHT INTERNAL JUGULAR;  Surgeon: Algernon Huxley, MD;  Location: [facility] ORS;  Service: Vascular;  Laterality: Right;   LEFT HEART CATHETERIZATION WITH CORONARY/GRAFT ANGIOGRAM Left 12/04/2013   Procedure: LEFT HEART CATHETERIZATION WITH Beatrix Fetters;  Surgeon: Leonie Man, MD;  Location: [facility] CATH LAB;  Service: Cardiovascular;  Laterality: Left;   PERCUTANEOUS CORONARY STENT INTERVENTION (PCI-S) N/A 12/06/2013   Procedure: PERCUTANEOUS CORONARY STENT INTERVENTION (PCI-S);  Surgeon: Sinclair Grooms, MD;  [facility] C[facility] LAB;  Service: Cardiovascular;  Laterality: N/A;   REMOVAL OF A DIALYSIS CATHETER N/A 11/03/2020   Procedure: REMOVAL OF A DIALYSIS CATHETER;  Surgeon: Algernon Huxley, MD;  Location: [facility] ORS;  Service: Vascular;  Laterality: N/A;   VEIN BYPASS SURGERY     [place] Bypass Surgery     Family History  Problem Relation Age of Onset   Heart attack Father 84       died first MI at age 71   Heart failure Mother     Allergies  Allergen Reactions   Other     Other reaction(s): Myalgias (intolerance), Other (See Comments) Statin Drugs  Statin Drugs     Amoxicillin Diarrhea    CBC Latest Ref Rng & Units 11/04/2020 11/03/2020 11/02/2020  WBC 4.0 - 10.5 K/uL 10.1 5.4 4.7  Hemoglobin 13.0 - 17.0 g/dL 11.3(L) 12.5(L) 11.8(L)  Hematocrit 39.0 - 52.0 % 33.1(L) 37.7(L) 36.0(L)  Platelets 150 - 400 K/uL 118(L) 119(L)  113(L)      CMP     Component Value Date/Time   NA 136 11/04/2020 1343   NA 136 11/04/2020 1343   K 4.0 11/04/2020 1343   K 4.0 11/04/2020 1343   CL 100 11/04/2020 1343   CL 100 11/04/2020 1343   CO2 30 11/04/2020 1343   CO2  29 11/04/2020 1343   GLUCOSE 166 (H) 11/04/2020 1343   GLUCOSE 163 (H) 11/04/2020 1343   BUN 28 (H) 11/04/2020 1343   BUN 28 (H) 11/04/2020 1343   CREATININE 2.09 (H) 11/04/2020 1343   CREATININE 2.09 (H) 11/04/2020 1343   CALCIUM 8.4 (L) 11/04/2020 1343   CALCIUM 8.4 (L) 11/04/2020 1343   PROT 6.4 (L) 11/01/2020 1129   ALBUMIN 3.2 (L) 11/04/2020 1343   AST 37 11/01/2020 1129   ALT 54 (H) 11/01/2020 1129   ALKPHOS 142 (H) 11/01/2020 1129   BILITOT 0.6 11/01/2020 1129   GFRNONAA 30 (L) 11/04/2020 1343   GFRNONAA 30 (L) 11/04/2020 1343   GFRAA 25 (L) 05/26/2019 0503     No results found.     Assessment & Plan:   1. ESRD (end stage renal disease) ([place]) Recommend:  At this time the patient does not have appropriate extremity access for dialysis  Patient should have a left brachial axillary graft created.  The risks, benefits and alternative therapies were reviewed in detail with the patient.  All questions were answered.  The patient agrees to proceed with surgery.    2. Hyperlipidemia, unspecified hyperlipidemia type Continue statin as ordered and reviewed, no changes at this time    Current Outpatient Medications on File Prior to Visit  Medication Sig Dispense Refill   amiodarone (PACERONE) 200 MG tablet Take 200 mg by mouth daily.     cholecalciferol (VITAMIN D) 25 MCG (1000 UNIT) tablet Take 1,000 Units by mouth daily with breakfast.     clopidogrel (PLAVIX) 75 MG tablet Take 1 tablet (75 mg total) by mouth daily with breakfast. 30 tablet 12   ezetimibe (ZETIA) 10 MG tablet Take 10 mg by mouth daily.     ferrous sulfate 325 (65 FE) MG tablet Take 325 mg by mouth 2 (two) times daily with a meal.      fluticasone (FLONASE) 50 MCG/ACT  nasal spray Place 2 sprays into both nostrils daily as needed for allergies or rhinitis.     furosemide (LASIX) 40 MG tablet Take 2 tablets (80 mg total) by mouth 2 (two) times daily. 60 tablet 0   glipiZIDE (GLUCOTROL) 5 MG tablet Take 2.5 mg by mouth daily before breakfast.     levocetirizine (XYZAL) 5 MG tablet Take 10 mg by mouth every morning.      levothyroxine (SYNTHROID) 88 MCG tablet Take 88 mcg by mouth daily before breakfast.     metoprolol succinate (TOPROL-XL) 50 MG 24 hr tablet Take 50 mg by mouth daily with lunch.     multivitamin (RENA-VIT) TABS tablet Take 1 tablet by mouth daily.     nitroGLYCERIN (NITROSTAT) 0.4 MG SL tablet Place 0.4 mg under the tongue every 5 (five) minutes as needed for chest pain.     Omega-3 Fatty Acids (FISH OIL) 1000 MG CAPS Take 4,000 mg by mouth daily with lunch.     potassium chloride SA (KLOR-CON) 20 MEQ tablet Take 20 mEq by mouth daily.     rosuvastatin (CRESTOR) 40 MG tablet Take 40 mg by mouth at bedtime.     No current facility-administered medications on file prior to visit.    There are no Patient Instructions on file for this visit. No follow-ups on file.   Kris Hartmann, NP

## 2020-12-15 DIAGNOSIS — E1121 Type 2 diabetes mellitus with diabetic nephropathy: Secondary | ICD-10-CM | POA: Diagnosis not present

## 2020-12-15 DIAGNOSIS — I509 Heart failure, unspecified: Secondary | ICD-10-CM | POA: Diagnosis not present

## 2020-12-15 DIAGNOSIS — B029 Zoster without complications: Secondary | ICD-10-CM | POA: Diagnosis not present

## 2020-12-15 DIAGNOSIS — I10 Essential (primary) hypertension: Secondary | ICD-10-CM | POA: Diagnosis not present

## 2020-12-15 DIAGNOSIS — N186 End stage renal disease: Secondary | ICD-10-CM | POA: Diagnosis not present

## 2020-12-15 DIAGNOSIS — E785 Hyperlipidemia, unspecified: Secondary | ICD-10-CM | POA: Diagnosis not present

## 2020-12-16 ENCOUNTER — Telehealth (INDEPENDENT_AMBULATORY_CARE_PROVIDER_SITE_OTHER): Payer: Self-pay

## 2020-12-16 DIAGNOSIS — Z992 Dependence on renal dialysis: Secondary | ICD-10-CM | POA: Diagnosis not present

## 2020-12-16 DIAGNOSIS — N186 End stage renal disease: Secondary | ICD-10-CM | POA: Diagnosis not present

## 2020-12-16 DIAGNOSIS — N2581 Secondary hyperparathyroidism of renal origin: Secondary | ICD-10-CM | POA: Diagnosis not present

## 2020-12-16 NOTE — Telephone Encounter (Signed)
Spoke with patient's daughter and he is scheduled with Dr. Lucky Cowboy for a left brachial axillary graft on 12/25/20 at [facility]. Pre-op phone call on 12/19/20 between 1-5 pm and pre-surgical instructions were discussed and will be mailed.

## 2020-12-17 NOTE — Telephone Encounter (Signed)
Patient's daughter left a message stating she forgot to let us know the patient has shingles. Per Dr. Lucky Cowboy the patient has been rescheduled to accommodate his illness at this time. Patient has been rescheduled to 01/08/21 at the [facility].

## 2020-12-18 DIAGNOSIS — I120 Hypertensive chronic kidney disease with stage 5 chronic kidney disease or end stage renal disease: Secondary | ICD-10-CM | POA: Diagnosis not present

## 2020-12-18 DIAGNOSIS — N2581 Secondary hyperparathyroidism of renal origin: Secondary | ICD-10-CM | POA: Diagnosis not present

## 2020-12-18 DIAGNOSIS — E782 Mixed hyperlipidemia: Secondary | ICD-10-CM | POA: Diagnosis not present

## 2020-12-18 DIAGNOSIS — N186 End stage renal disease: Secondary | ICD-10-CM | POA: Diagnosis not present

## 2020-12-18 DIAGNOSIS — E1122 Type 2 diabetes mellitus with diabetic chronic kidney disease: Secondary | ICD-10-CM | POA: Diagnosis not present

## 2020-12-18 DIAGNOSIS — Z992 Dependence on renal dialysis: Secondary | ICD-10-CM | POA: Diagnosis not present

## 2020-12-19 ENCOUNTER — Other Ambulatory Visit: Payer: Medicare Other

## 2020-12-20 DIAGNOSIS — N2581 Secondary hyperparathyroidism of renal origin: Secondary | ICD-10-CM | POA: Diagnosis not present

## 2020-12-20 DIAGNOSIS — N186 End stage renal disease: Secondary | ICD-10-CM | POA: Diagnosis not present

## 2020-12-20 DIAGNOSIS — Z992 Dependence on renal dialysis: Secondary | ICD-10-CM | POA: Diagnosis not present

## 2020-12-21 DIAGNOSIS — N186 End stage renal disease: Secondary | ICD-10-CM | POA: Diagnosis not present

## 2020-12-21 DIAGNOSIS — Z992 Dependence on renal dialysis: Secondary | ICD-10-CM | POA: Diagnosis not present

## 2020-12-22 ENCOUNTER — Other Ambulatory Visit: Payer: Medicare Other

## 2020-12-22 DIAGNOSIS — L89159 Pressure ulcer of sacral region, unspecified stage: Secondary | ICD-10-CM | POA: Diagnosis not present

## 2020-12-22 DIAGNOSIS — I4891 Unspecified atrial fibrillation: Secondary | ICD-10-CM | POA: Diagnosis not present

## 2020-12-22 DIAGNOSIS — B029 Zoster without complications: Secondary | ICD-10-CM | POA: Diagnosis not present

## 2020-12-22 DIAGNOSIS — E032 Hypothyroidism due to medicaments and other exogenous substances: Secondary | ICD-10-CM | POA: Diagnosis not present

## 2020-12-22 DIAGNOSIS — I351 Nonrheumatic aortic (valve) insufficiency: Secondary | ICD-10-CM | POA: Diagnosis not present

## 2020-12-22 DIAGNOSIS — I498 Other specified cardiac arrhythmias: Secondary | ICD-10-CM | POA: Diagnosis not present

## 2020-12-22 DIAGNOSIS — E785 Hyperlipidemia, unspecified: Secondary | ICD-10-CM | POA: Diagnosis not present

## 2020-12-22 DIAGNOSIS — E1122 Type 2 diabetes mellitus with diabetic chronic kidney disease: Secondary | ICD-10-CM | POA: Diagnosis not present

## 2020-12-22 DIAGNOSIS — G4700 Insomnia, unspecified: Secondary | ICD-10-CM | POA: Diagnosis not present

## 2020-12-22 DIAGNOSIS — I2510 Atherosclerotic heart disease of native coronary artery without angina pectoris: Secondary | ICD-10-CM | POA: Diagnosis not present

## 2020-12-22 DIAGNOSIS — D649 Anemia, unspecified: Secondary | ICD-10-CM | POA: Diagnosis not present

## 2020-12-22 DIAGNOSIS — I10 Essential (primary) hypertension: Secondary | ICD-10-CM | POA: Diagnosis not present

## 2020-12-23 DIAGNOSIS — Z992 Dependence on renal dialysis: Secondary | ICD-10-CM | POA: Diagnosis not present

## 2020-12-23 DIAGNOSIS — N186 End stage renal disease: Secondary | ICD-10-CM | POA: Diagnosis not present

## 2020-12-25 ENCOUNTER — Emergency Department: Payer: Medicare Other

## 2020-12-25 ENCOUNTER — Other Ambulatory Visit: Payer: Self-pay

## 2020-12-25 ENCOUNTER — Inpatient Hospital Stay
Admission: EM | Admit: 2020-12-25 | Discharge: 2021-01-06 | DRG: 312 | Disposition: A | Discharge status: Skilled Nursing Facility | Payer: Medicare Other | Attending: Internal Medicine | Admitting: Internal Medicine

## 2020-12-25 ENCOUNTER — Encounter: Payer: Self-pay | Admitting: Emergency Medicine

## 2020-12-25 DIAGNOSIS — I200 Unstable angina: Secondary | ICD-10-CM | POA: Diagnosis not present

## 2020-12-25 DIAGNOSIS — I10 Essential (primary) hypertension: Secondary | ICD-10-CM | POA: Diagnosis not present

## 2020-12-25 DIAGNOSIS — I120 Hypertensive chronic kidney disease with stage 5 chronic kidney disease or end stage renal disease: Secondary | ICD-10-CM | POA: Diagnosis not present

## 2020-12-25 DIAGNOSIS — R2681 Unsteadiness on feet: Secondary | ICD-10-CM | POA: Diagnosis not present

## 2020-12-25 DIAGNOSIS — I6782 Cerebral ischemia: Secondary | ICD-10-CM | POA: Diagnosis not present

## 2020-12-25 DIAGNOSIS — S42201A Unspecified fracture of upper end of right humerus, initial encounter for closed fracture: Secondary | ICD-10-CM | POA: Diagnosis not present

## 2020-12-25 DIAGNOSIS — Z85828 Personal history of other malignant neoplasm of skin: Secondary | ICD-10-CM

## 2020-12-25 DIAGNOSIS — E1122 Type 2 diabetes mellitus with diabetic chronic kidney disease: Secondary | ICD-10-CM

## 2020-12-25 DIAGNOSIS — I5022 Chronic systolic (congestive) heart failure: Secondary | ICD-10-CM | POA: Diagnosis not present

## 2020-12-25 DIAGNOSIS — Z87891 Personal history of nicotine dependence: Secondary | ICD-10-CM

## 2020-12-25 DIAGNOSIS — I5023 Acute on chronic systolic (congestive) heart failure: Secondary | ICD-10-CM

## 2020-12-25 DIAGNOSIS — W19XXXD Unspecified fall, subsequent encounter: Secondary | ICD-10-CM | POA: Diagnosis not present

## 2020-12-25 DIAGNOSIS — I252 Old myocardial infarction: Secondary | ICD-10-CM

## 2020-12-25 DIAGNOSIS — D6959 Other secondary thrombocytopenia: Secondary | ICD-10-CM | POA: Diagnosis present

## 2020-12-25 DIAGNOSIS — I959 Hypotension, unspecified: Secondary | ICD-10-CM | POA: Diagnosis present

## 2020-12-25 DIAGNOSIS — Z951 Presence of aortocoronary bypass graft: Secondary | ICD-10-CM

## 2020-12-25 DIAGNOSIS — I482 Chronic atrial fibrillation, unspecified: Secondary | ICD-10-CM

## 2020-12-25 DIAGNOSIS — N186 End stage renal disease: Secondary | ICD-10-CM

## 2020-12-25 DIAGNOSIS — Z7984 Long term (current) use of oral hypoglycemic drugs: Secondary | ICD-10-CM | POA: Diagnosis not present

## 2020-12-25 DIAGNOSIS — S42294K Other nondisplaced fracture of upper end of right humerus, subsequent encounter for fracture with nonunion: Secondary | ICD-10-CM | POA: Diagnosis not present

## 2020-12-25 DIAGNOSIS — Z66 Do not resuscitate: Secondary | ICD-10-CM | POA: Diagnosis present

## 2020-12-25 DIAGNOSIS — L89322 Pressure ulcer of left buttock, stage 2: Secondary | ICD-10-CM | POA: Diagnosis present

## 2020-12-25 DIAGNOSIS — S42251A Displaced fracture of greater tuberosity of right humerus, initial encounter for closed fracture: Secondary | ICD-10-CM | POA: Diagnosis present

## 2020-12-25 DIAGNOSIS — E039 Hypothyroidism, unspecified: Secondary | ICD-10-CM

## 2020-12-25 DIAGNOSIS — I2510 Atherosclerotic heart disease of native coronary artery without angina pectoris: Secondary | ICD-10-CM | POA: Diagnosis present

## 2020-12-25 DIAGNOSIS — S42291D Other displaced fracture of upper end of right humerus, subsequent encounter for fracture with routine healing: Secondary | ICD-10-CM | POA: Diagnosis not present

## 2020-12-25 DIAGNOSIS — W19XXXA Unspecified fall, initial encounter: Secondary | ICD-10-CM | POA: Diagnosis present

## 2020-12-25 DIAGNOSIS — Z20822 Contact with and (suspected) exposure to covid-19: Secondary | ICD-10-CM | POA: Diagnosis present

## 2020-12-25 DIAGNOSIS — I25810 Atherosclerosis of coronary artery bypass graft(s) without angina pectoris: Secondary | ICD-10-CM

## 2020-12-25 DIAGNOSIS — Z79890 Hormone replacement therapy (postmenopausal): Secondary | ICD-10-CM

## 2020-12-25 DIAGNOSIS — I4891 Unspecified atrial fibrillation: Secondary | ICD-10-CM

## 2020-12-25 DIAGNOSIS — L89312 Pressure ulcer of right buttock, stage 2: Secondary | ICD-10-CM | POA: Diagnosis present

## 2020-12-25 DIAGNOSIS — Z992 Dependence on renal dialysis: Secondary | ICD-10-CM | POA: Diagnosis not present

## 2020-12-25 DIAGNOSIS — Y92008 Other place in unspecified non-institutional (private) residence as the place of occurrence of the external cause: Secondary | ICD-10-CM | POA: Diagnosis not present

## 2020-12-25 DIAGNOSIS — E119 Type 2 diabetes mellitus without complications: Secondary | ICD-10-CM

## 2020-12-25 DIAGNOSIS — R29818 Other symptoms and signs involving the nervous system: Secondary | ICD-10-CM | POA: Diagnosis not present

## 2020-12-25 DIAGNOSIS — Z79899 Other long term (current) drug therapy: Secondary | ICD-10-CM

## 2020-12-25 DIAGNOSIS — R5381 Other malaise: Secondary | ICD-10-CM | POA: Diagnosis not present

## 2020-12-25 DIAGNOSIS — I480 Paroxysmal atrial fibrillation: Secondary | ICD-10-CM | POA: Diagnosis present

## 2020-12-25 DIAGNOSIS — I951 Orthostatic hypotension: Principal | ICD-10-CM | POA: Diagnosis present

## 2020-12-25 DIAGNOSIS — Z7902 Long term (current) use of antithrombotics/antiplatelets: Secondary | ICD-10-CM | POA: Diagnosis not present

## 2020-12-25 DIAGNOSIS — R251 Tremor, unspecified: Secondary | ICD-10-CM

## 2020-12-25 DIAGNOSIS — E785 Hyperlipidemia, unspecified: Secondary | ICD-10-CM | POA: Diagnosis not present

## 2020-12-25 DIAGNOSIS — I132 Hypertensive heart and chronic kidney disease with heart failure and with stage 5 chronic kidney disease, or end stage renal disease: Secondary | ICD-10-CM | POA: Diagnosis present

## 2020-12-25 DIAGNOSIS — D631 Anemia in chronic kidney disease: Secondary | ICD-10-CM | POA: Diagnosis present

## 2020-12-25 DIAGNOSIS — Z452 Encounter for adjustment and management of vascular access device: Secondary | ICD-10-CM | POA: Diagnosis not present

## 2020-12-25 DIAGNOSIS — S42211A Unspecified displaced fracture of surgical neck of right humerus, initial encounter for closed fracture: Secondary | ICD-10-CM | POA: Diagnosis not present

## 2020-12-25 DIAGNOSIS — M6281 Muscle weakness (generalized): Secondary | ICD-10-CM | POA: Diagnosis not present

## 2020-12-25 DIAGNOSIS — I9589 Other hypotension: Secondary | ICD-10-CM | POA: Diagnosis present

## 2020-12-25 DIAGNOSIS — S42209A Unspecified fracture of upper end of unspecified humerus, initial encounter for closed fracture: Secondary | ICD-10-CM | POA: Diagnosis present

## 2020-12-25 DIAGNOSIS — N2581 Secondary hyperparathyroidism of renal origin: Secondary | ICD-10-CM | POA: Diagnosis present

## 2020-12-25 DIAGNOSIS — W010XXA Fall on same level from slipping, tripping and stumbling without subsequent striking against object, initial encounter: Secondary | ICD-10-CM | POA: Diagnosis present

## 2020-12-25 DIAGNOSIS — E871 Hypo-osmolality and hyponatremia: Secondary | ICD-10-CM | POA: Diagnosis present

## 2020-12-25 DIAGNOSIS — R279 Unspecified lack of coordination: Secondary | ICD-10-CM | POA: Diagnosis not present

## 2020-12-25 DIAGNOSIS — I519 Heart disease, unspecified: Secondary | ICD-10-CM | POA: Diagnosis not present

## 2020-12-25 DIAGNOSIS — S72471D Torus fracture of lower end of right femur, subsequent encounter for fracture with routine healing: Secondary | ICD-10-CM | POA: Diagnosis not present

## 2020-12-25 DIAGNOSIS — S42471D Displaced transcondylar fracture of right humerus, subsequent encounter for fracture with routine healing: Secondary | ICD-10-CM | POA: Diagnosis not present

## 2020-12-25 DIAGNOSIS — R4182 Altered mental status, unspecified: Secondary | ICD-10-CM | POA: Diagnosis not present

## 2020-12-25 DIAGNOSIS — R2689 Other abnormalities of gait and mobility: Secondary | ICD-10-CM | POA: Diagnosis not present

## 2020-12-25 DIAGNOSIS — C4442 Squamous cell carcinoma of skin of scalp and neck: Secondary | ICD-10-CM | POA: Diagnosis not present

## 2020-12-25 DIAGNOSIS — K219 Gastro-esophageal reflux disease without esophagitis: Secondary | ICD-10-CM | POA: Diagnosis not present

## 2020-12-25 DIAGNOSIS — S199XXA Unspecified injury of neck, initial encounter: Secondary | ICD-10-CM | POA: Diagnosis not present

## 2020-12-25 DIAGNOSIS — S42291A Other displaced fracture of upper end of right humerus, initial encounter for closed fracture: Secondary | ICD-10-CM | POA: Diagnosis not present

## 2020-12-25 LAB — CBC WITH DIFFERENTIAL/PLATELET
Abs Immature Granulocytes: 0.05 10*3/uL (ref 0.00–0.07)
Basophils Absolute: 0.1 10*3/uL (ref 0.0–0.1)
Basophils Relative: 1 %
Eosinophils Absolute: 0.5 10*3/uL (ref 0.0–0.5)
Eosinophils Relative: 6 %
HCT: 34 % — ABNORMAL LOW (ref 39.0–52.0)
Hemoglobin: 11.5 g/dL — ABNORMAL LOW (ref 13.0–17.0)
Immature Granulocytes: 1 %
Lymphocytes Relative: 11 %
Lymphs Abs: 0.9 10*3/uL (ref 0.7–4.0)
MCH: 33.5 pg (ref 26.0–34.0)
MCHC: 33.8 g/dL (ref 30.0–36.0)
MCV: 99.1 fL (ref 80.0–100.0)
Monocytes Absolute: 0.7 10*3/uL (ref 0.1–1.0)
Monocytes Relative: 10 %
Neutro Abs: 5.6 10*3/uL (ref 1.7–7.7)
Neutrophils Relative %: 71 %
Platelets: 136 10*3/uL — ABNORMAL LOW (ref 150–400)
RBC: 3.43 MIL/uL — ABNORMAL LOW (ref 4.22–5.81)
RDW: 13.1 % (ref 11.5–15.5)
WBC: 7.8 10*3/uL (ref 4.0–10.5)
nRBC: 0 % (ref 0.0–0.2)

## 2020-12-25 LAB — COMPREHENSIVE METABOLIC PANEL
ALT: 34 U/L (ref 0–44)
AST: 35 U/L (ref 15–41)
Albumin: 3.4 g/dL — ABNORMAL LOW (ref 3.5–5.0)
Alkaline Phosphatase: 94 U/L (ref 38–126)
Anion gap: 7 (ref 5–15)
BUN: 53 mg/dL — ABNORMAL HIGH (ref 8–23)
CO2: 28 mmol/L (ref 22–32)
Calcium: 9.3 mg/dL (ref 8.9–10.3)
Chloride: 100 mmol/L (ref 98–111)
Creatinine, Ser: 3.45 mg/dL — ABNORMAL HIGH (ref 0.61–1.24)
GFR, Estimated: 16 mL/min — ABNORMAL LOW (ref >60)
Glucose, Bld: 129 mg/dL — ABNORMAL HIGH (ref 70–99)
Potassium: 5.2 mmol/L — ABNORMAL HIGH (ref 3.5–5.1)
Sodium: 135 mmol/L (ref 135–145)
Total Bilirubin: 0.9 mg/dL (ref 0.3–1.2)
Total Protein: 6.6 g/dL (ref 6.5–8.1)

## 2020-12-25 LAB — RESP PANEL BY RT-PCR (FLU A&B, COVID) ARPGX2
Influenza A by PCR: NEGATIVE
Influenza B by PCR: NEGATIVE
SARS Coronavirus 2 by RT PCR: NEGATIVE

## 2020-12-25 LAB — GLUCOSE, CAPILLARY
Glucose-Capillary: 153 mg/dL — ABNORMAL HIGH (ref 70–99)
Glucose-Capillary: 214 mg/dL — ABNORMAL HIGH (ref 70–99)

## 2020-12-25 LAB — CBG MONITORING, ED: Glucose-Capillary: 115 mg/dL — ABNORMAL HIGH (ref 70–99)

## 2020-12-25 MED ORDER — FLUTICASONE PROPIONATE 50 MCG/ACT NA SUSP
2.0000 | Freq: Every day | NASAL | Status: DC | PRN
  Filled 2020-12-25: qty 16

## 2020-12-25 MED ORDER — EZETIMIBE 10 MG PO TABS
10.0000 mg | ORAL_TABLET | Freq: Every day | ORAL | Status: DC
  Administered 2020-12-26 – 2021-01-06 (×12): 10 mg via ORAL
  Filled 2020-12-25 (×13): qty 1

## 2020-12-25 MED ORDER — AMIODARONE HCL 200 MG PO TABS
200.0000 mg | ORAL_TABLET | Freq: Every day | ORAL | Status: DC
  Administered 2020-12-25 – 2021-01-06 (×13): 200 mg via ORAL
  Filled 2020-12-25 (×13): qty 1

## 2020-12-25 MED ORDER — METOPROLOL SUCCINATE ER 25 MG PO TB24: 25.0000 mg | ORAL_TABLET | Freq: Every day | ORAL | Status: DC

## 2020-12-25 MED ORDER — LORATADINE 10 MG PO TABS
10.0000 mg | ORAL_TABLET | Freq: Every morning | ORAL | Status: DC
  Administered 2020-12-25 – 2021-01-06 (×11): 10 mg via ORAL
  Filled 2020-12-25 (×9): qty 1

## 2020-12-25 MED ORDER — ONDANSETRON HCL 4 MG/2ML IJ SOLN: 4.0000 mg | Freq: Four times a day (QID) | INTRAMUSCULAR | Status: DC | PRN

## 2020-12-25 MED ORDER — MORPHINE SULFATE (PF) 4 MG/ML IV SOLN
4.0000 mg | INTRAVENOUS | Status: DC | PRN
  Administered 2020-12-25 (×2): 4 mg via INTRAVENOUS
  Filled 2020-12-25 (×2): qty 1

## 2020-12-25 MED ORDER — RENA-VITE PO TABS
1.0000 | ORAL_TABLET | Freq: Every day | ORAL | Status: DC
  Administered 2020-12-25 – 2021-01-06 (×13): 1 via ORAL
  Filled 2020-12-25 (×14): qty 1

## 2020-12-25 MED ORDER — HYDROCODONE-ACETAMINOPHEN 5-325 MG PO TABS: 1.0000 | ORAL_TABLET | ORAL | Status: DC | PRN

## 2020-12-25 MED ORDER — SODIUM CHLORIDE 0.9% FLUSH: 3.0000 mL | INTRAVENOUS | Status: DC | PRN

## 2020-12-25 MED ORDER — CLOPIDOGREL BISULFATE 75 MG PO TABS
75.0000 mg | ORAL_TABLET | Freq: Every day | ORAL | Status: DC
  Administered 2020-12-26 – 2021-01-06 (×12): 75 mg via ORAL
  Filled 2020-12-25 (×12): qty 1

## 2020-12-25 MED ORDER — ROSUVASTATIN CALCIUM 10 MG PO TABS
40.0000 mg | ORAL_TABLET | Freq: Every day | ORAL | Status: DC
  Administered 2020-12-25 – 2021-01-05 (×12): 40 mg via ORAL
  Filled 2020-12-25 (×12): qty 4

## 2020-12-25 MED ORDER — MIDODRINE HCL 5 MG PO TABS
10.0000 mg | ORAL_TABLET | Freq: Every day | ORAL | Status: DC
  Administered 2020-12-25: 10 mg via ORAL
  Filled 2020-12-25: qty 2

## 2020-12-25 MED ORDER — INSULIN ASPART 100 UNIT/ML IJ SOLN
0.0000 [IU] | Freq: Three times a day (TID) | INTRAMUSCULAR | Status: DC
  Administered 2020-12-29: 1 [IU] via SUBCUTANEOUS
  Filled 2020-12-25: qty 1

## 2020-12-25 MED ORDER — ONDANSETRON HCL 4 MG PO TABS: 4.0000 mg | ORAL_TABLET | Freq: Four times a day (QID) | ORAL | Status: DC | PRN

## 2020-12-25 MED ORDER — SODIUM CHLORIDE 0.9% FLUSH
3.0000 mL | Freq: Two times a day (BID) | INTRAVENOUS | Status: DC
  Administered 2020-12-25 – 2021-01-05 (×17): 3 mL via INTRAVENOUS

## 2020-12-25 MED ORDER — CHLORHEXIDINE GLUCONATE CLOTH 2 % EX PADS
6.0000 | MEDICATED_PAD | Freq: Every day | CUTANEOUS | Status: DC
  Administered 2020-12-26 – 2021-01-06 (×11): 6 via TOPICAL
  Filled 2020-12-25: qty 6

## 2020-12-25 MED ORDER — HEPARIN SODIUM (PORCINE) 5000 UNIT/ML IJ SOLN
5000.0000 [IU] | Freq: Three times a day (TID) | INTRAMUSCULAR | Status: DC
  Administered 2020-12-25 – 2021-01-06 (×33): 5000 [IU] via SUBCUTANEOUS
  Filled 2020-12-25 (×32): qty 1

## 2020-12-25 MED ORDER — NITROGLYCERIN 0.4 MG SL SUBL: 0.4000 mg | SUBLINGUAL_TABLET | SUBLINGUAL | Status: DC | PRN

## 2020-12-25 MED ORDER — MIDODRINE HCL 5 MG PO TABS
10.0000 mg | ORAL_TABLET | Freq: Two times a day (BID) | ORAL | Status: DC
  Administered 2020-12-25: 10 mg via ORAL
  Filled 2020-12-25: qty 2

## 2020-12-25 MED ORDER — LEVOTHYROXINE SODIUM 50 MCG PO TABS
75.0000 ug | ORAL_TABLET | Freq: Every day | ORAL | Status: DC
  Administered 2020-12-26 – 2021-01-06 (×12): 75 ug via ORAL
  Filled 2020-12-25 (×12): qty 2

## 2020-12-25 MED ORDER — HYDROCODONE-ACETAMINOPHEN 5-325 MG PO TABS
1.0000 | ORAL_TABLET | ORAL | Status: DC | PRN
Start: 2020-12-25 — End: 2020-12-26
  Administered 2020-12-26 (×2): 2 via ORAL
  Filled 2020-12-25 (×2): qty 2

## 2020-12-25 MED ORDER — SODIUM CHLORIDE 0.9 % IV SOLN: 250.0000 mL | INTRAVENOUS | Status: DC | PRN

## 2020-12-25 MED ORDER — OMEGA-3-ACID ETHYL ESTERS 1 G PO CAPS
4000.0000 mg | ORAL_CAPSULE | Freq: Every day | ORAL | Status: DC
  Administered 2020-12-26 – 2021-01-04 (×10): 4000 mg via ORAL
  Filled 2020-12-25 (×10): qty 4

## 2020-12-25 MED ORDER — IBUPROFEN 600 MG PO TABS
600.0000 mg | ORAL_TABLET | Freq: Once | ORAL | Status: AC
Start: 2020-12-25 — End: 2020-12-25
  Administered 2020-12-25: 600 mg via ORAL
  Filled 2020-12-25: qty 1

## 2020-12-25 MED ORDER — POTASSIUM CHLORIDE CRYS ER 20 MEQ PO TBCR
20.0000 meq | EXTENDED_RELEASE_TABLET | Freq: Every day | ORAL | Status: DC
  Administered 2020-12-26: 20 meq via ORAL
  Filled 2020-12-25: qty 1

## 2020-12-25 MED ORDER — VITAMIN D3 25 MCG (1000 UNIT) PO TABS
1000.0000 [IU] | ORAL_TABLET | Freq: Every day | ORAL | Status: DC
  Administered 2020-12-26 – 2021-01-06 (×12): 1000 [IU] via ORAL
  Filled 2020-12-25 (×25): qty 1

## 2020-12-25 MED ORDER — FUROSEMIDE 40 MG PO TABS
80.0000 mg | ORAL_TABLET | Freq: Two times a day (BID) | ORAL | Status: DC
  Administered 2020-12-25: 80 mg via ORAL
  Filled 2020-12-25: qty 2

## 2020-12-25 MED ORDER — FERROUS SULFATE 325 (65 FE) MG PO TABS
325.0000 mg | ORAL_TABLET | Freq: Two times a day (BID) | ORAL | Status: DC
  Administered 2020-12-25 – 2021-01-06 (×23): 325 mg via ORAL
  Filled 2020-12-25 (×24): qty 1

## 2020-12-25 NOTE — ED Triage Notes (Addendum)
Pt arrived via POV with daughter, reports pt bent down to reach for something while he was getting ready to go dialysis and lost his balance and fell onto R shoulder.  Denies hitting his head. Denies any dizziness.  Pt also currently has Shingles and recently started on Gabapentin which he took a dose at 2am this morning. Shingles started 11 days ago.

## 2020-12-25 NOTE — ED Provider Notes (Signed)
[facility] Emergency Department Provider Note   ____________________________________________   Event Date/Time   First MD Initiated Contact with Patient 12/25/20 [phone]     (approximate)  I have reviewed the triage vital signs and the nursing notes.   HISTORY  Chief Complaint Fall and Shoulder Injury    HPI Matthew [name] is a 87 y.o. male who presents for right shoulder pain after mechanical fall  LOCATION: Right shoulder DURATION: Just prior to arrival TIMING: Stable since onset SEVERITY: 9/10 QUALITY: Aching CONTEXT: States he bent down to reach for something while he was getting ready to go to dialysis this morning and lost his balance falling onto his right side MODIFYING FACTORS: Any movement at the right shoulder worsens this pain and is partially relieved at rest ASSOCIATED SYMPTOMS: Denies   Per medical record review, patient has history of end-stage renal disease on dialysis Tuesday/Thursday/Saturday          Past Medical History:  Diagnosis Date   Anemia    Arthritis    Broken arm 05/2013   Left   CHF (congestive heart failure) ([place])    Chronic kidney disease    Coronary artery disease    Diabetes mellitus without complication ([place])    Dyspnea    DOE   Edema    FEET/LEGS OCCAS   Hypertension    Myocardial infarction ([place])    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   left frontal scalp, [facility]   Squamous cell carcinoma of skin 05/05/2020   left temporal scalp, in situ, [facility] 05/13/20    Patient Active Problem List   Diagnosis Date Noted   Fall 12/25/2020   Malposition of peritoneal dialysis catheter ([place]) 11/01/2020   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   Sepsis ([place]) 02/15/2018   Fluid overload 01/10/2018   Hypoglycemia 12/09/2017   Chronic kidney disease 07/18/2015   GERD (gastroesophageal reflux disease) 07/18/2015   CAD (coronary artery disease) 12/04/2013   Unstable angina; Class III Angina 12/03/2013    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 ([place]) 12/03/2013   HLD (hyperlipidemia) 12/03/2013   Heart disease 12/03/2013    Past Surgical History:  Procedure Laterality Date   CATARACT EXTRACTION W/PHACO Left 07/26/2017   Procedure: CATARACT EXTRACTION PHACO AND INTRAOCULAR LENS PLACEMENT ([place]);  Surgeon: Birder Robson, MD;  Location: [facility] ORS;  Service: Ophthalmology;  Laterality: Left;  [place]   00:45.8 AP%  13.1 CDE  6.00 Fluid Pack Lot # [id number]   CATARACT EXTRACTION W/PHACO Right 08/17/2017   Procedure: CATARACT EXTRACTION PHACO AND INTRAOCULAR LENS PLACEMENT ([facility]);  Surgeon: Birder Robson, MD;  Location: [facility] ORS;  Service: Ophthalmology;  Laterality: Right;  [place]  01:30 AP% 16.8 CDE 15.24 Fluid pack lot # [id number]   CHOLECYSTECTOMY     CORONARY ANGIOPLASTY     STENTS   CORONARY ARTERY BYPASS GRAFT     DIALYSIS/PERMA CATHETER INSERTION N/A 02/09/2018   Procedure: DIALYSIS/PERMA CATHETER INSERTION;  Surgeon: Algernon Huxley, MD;  Location: [place] CV LAB;  Service: Cardiovascular;  Laterality: N/A;   FRACTURE SURGERY     ARM   INSERTION OF DIALYSIS CATHETER Right 11/03/2020   Procedure: INSERTION OF Perm Cath in the [place];  Surgeon: Algernon Huxley, MD;  Location: [facility] ORS;  Service: Vascular;  Laterality: Right;   LEFT HEART CATHETERIZATION WITH CORONARY/GRAFT ANGIOGRAM Left 12/04/2013   Procedure: LEFT HEART CATHETERIZATION WITH Beatrix Fetters;  Surgeon: Leonie Man, MD;  Location:  [place] CATH LAB;  Service: Cardiovascular;  Laterality: Left;   PERCUTANEOUS CORONARY STENT INTERVENTION (PCI-S) N/A 12/06/2013   Procedure: PERCUTANEOUS CORONARY STENT INTERVENTION (PCI-S);  Surgeon: Sinclair Grooms, MD;  Location: [facility] CATH LAB;  Service: Cardiovascular;  Laterality: N/A;   REMOVAL OF A DIALYSIS CATHETER N/A 11/03/2020   Procedure: REMOVAL OF A DIALYSIS CATHETER;  Surgeon: Algernon Huxley, MD;  Location: [facility] ORS;  Service: Vascular;  Laterality: N/A;    VEIN BYPASS SURGERY     [place] Bypass Surgery     Prior to Admission medications   Medication Sig Start Date End Date Taking? Authorizing Provider  amiodarone (PACERONE) 200 MG tablet Take 200 mg by mouth daily.    [provider]  cholecalciferol (VITAMIN D) 25 MCG (1000 UNIT) tablet Take 1,000 Units by mouth daily with breakfast.    [provider]  clopidogrel (PLAVIX) 75 MG tablet Take 1 tablet (75 mg total) by mouth daily with breakfast. 12/07/13   Almyra Deforest, PA  ezetimibe (ZETIA) 10 MG tablet Take 10 mg by mouth daily.    [provider]  ferrous sulfate 325 (65 FE) MG tablet Take 325 mg by mouth 2 (two) times daily with a meal.     [provider]  fluticasone (FLONASE) 50 MCG/ACT nasal spray Place 2 sprays into both nostrils daily as needed for allergies or rhinitis.    [provider]  furosemide (LASIX) 40 MG tablet Take 2 tablets (80 mg total) by mouth 2 (two) times daily. 01/15/18   Pyreddy, Reatha Harps, MD  glipiZIDE (GLUCOTROL) 5 MG tablet Take 2.5 mg by mouth daily before breakfast.    [provider]  levocetirizine (XYZAL) 5 MG tablet Take 10 mg by mouth every morning.     [provider]  levothyroxine (SYNTHROID) 88 MCG tablet Take 88 mcg by mouth daily before breakfast.    [provider]  metoprolol succinate (TOPROL-XL) 50 MG 24 hr tablet Take 50 mg by mouth daily with lunch.    [provider]  multivitamin (RENA-VIT) TABS tablet Take 1 tablet by mouth daily.    [provider]  nitroGLYCERIN (NITROSTAT) 0.4 MG SL tablet Place 0.4 mg under the tongue every 5 (five) minutes as needed for chest pain.    [provider]  Omega-3 Fatty Acids (FISH OIL) 1000 MG CAPS Take 4,000 mg by mouth daily with lunch.    [provider]  potassium chloride SA (KLOR-CON) 20 MEQ tablet Take 20 mEq by mouth daily.    [provider]  rosuvastatin (CRESTOR) 40 MG tablet Take 40 mg  by mouth at bedtime. 04/21/19   [provider]    Allergies Other and Amoxicillin  Family History  Problem Relation Age of Onset   Heart attack Father 35       died first MI at age 81   Heart failure Mother     Social History Social History   Tobacco Use   Smoking status: Former    Types: Cigarettes   Smokeless tobacco: Never   Tobacco comments:    quit 30 yrs ago, smoked 14-15 yrs, mainly pipe  Vaping Use   Vaping Use: Never used  Substance Use Topics   Alcohol use: Yes    Alcohol/week: 1.0 standard drink    Types: 1 Glasses of wine per week    Comment: occasionally drinking only   Drug use: No    Review of Systems Constitutional: No fever/chills Eyes: No visual  changes. ENT: No sore throat. Cardiovascular: Denies chest pain. Respiratory: Denies shortness of breath. Gastrointestinal: No abdominal pain.  No nausea, no vomiting.  No diarrhea. Genitourinary: Negative for dysuria. Musculoskeletal: Endorses acute right shoulder pain Skin: Negative for rash. Neurological: Negative for headaches, weakness/numbness/paresthesias in any extremity Psychiatric: Negative for suicidal ideation/homicidal ideation   ____________________________________________   PHYSICAL EXAM:  VITAL SIGNS: ED Triage Vitals  Enc Vitals Group     BP 12/25/20 0623 (S) (!) 95/52     Pulse Rate 12/25/20 0623 70     Resp 12/25/20 0623 18     Temp 12/25/20 0623 98.3 F (36.8 C)     Temp Source 12/25/20 0623 Oral     SpO2 12/25/20 0623 100 %     Weight 12/25/20 0627 215 lb (97.5 kg)     Height 12/25/20 0627 6' (1.829 m)     Head Circumference --      Peak Flow --      Pain Score 12/25/20 0627 9     Pain Loc --      Pain Edu? --      Excl. in [place]? --    Constitutional: Alert and oriented. Well appearing and in no acute distress. Eyes: Conjunctivae are normal. PERRL. Head: Atraumatic. Nose: No congestion/rhinnorhea. Mouth/Throat: Mucous membranes are moist. Neck: No  stridor Cardiovascular: Grossly normal heart sounds.  Good peripheral circulation. Respiratory: Normal respiratory effort.  No retractions. Gastrointestinal: Soft and nontender. No distention. Musculoskeletal: No obvious deformities.  Significant tenderness and pain with range of motion at the right shoulder.  Distally neurovascularly intact Neurologic:  Normal speech and language. No gross focal neurologic deficits are appreciated. Skin:  Skin is warm and dry. No rash noted. Psychiatric: Mood and affect are normal. Speech and behavior are normal.  ____________________________________________   LABS (all labs ordered are listed, but only abnormal results are displayed)  Labs Reviewed  COMPREHENSIVE METABOLIC PANEL - Abnormal; Notable for the following components:      Result Value   Potassium 5.2 (*)    Glucose, Bld 129 (*)    BUN 53 (*)    Creatinine, Ser 3.45 (*)    Albumin 3.4 (*)    GFR, Estimated 16 (*)    All other components within normal limits  CBC WITH DIFFERENTIAL/PLATELET - Abnormal; Notable for the following components:   RBC 3.43 (*)    Hemoglobin 11.5 (*)    HCT 34.0 (*)    Platelets 136 (*)    All other components within normal limits  CBG MONITORING, ED - Abnormal; Notable for the following components:   Glucose-Capillary 115 (*)    All other components within normal limits  RESP PANEL BY RT-PCR (FLU A&B, COVID) [id number]    RADIOLOGY  ED MD interpretation: 2 view x-rays of the right shoulder and right humerus shows a comminuted and displaced fracture of the proximal right humerus  Official radiology report(s): DG Shoulder Right  Result Date: 12/25/2020 CLINICAL DATA:  Fall.  Shoulder pain EXAM: RIGHT SHOULDER - 2+ VIEW COMPARISON:  None. FINDINGS: Fracture proximal right humerus involving the humeral neck and greater tuberosity. Humeral head normally located. Degenerative change of the greater tuberosity and AC joint. Right jugular central venous catheter  noted. IMPRESSION: Comminuted fracture proximal right humerus. Electronically Signed   By: Franchot Gallo M.D.   On: 12/25/2020 08:02   DG Humerus Right  Result Date: 12/25/2020 CLINICAL DATA:  Fall EXAM: RIGHT HUMERUS - 2+ VIEW COMPARISON:  Right shoulder 12/25/2020  FINDINGS: Fracture right humeral neck. Comminuted fracture with mild displacement. Humeral head normally located. Degenerative change in the shoulder and AC joint. IMPRESSION: Comminuted and displaced fracture proximal right humerus involving the humeral neck and greater tuberosity. Electronically Signed   By: Franchot Gallo M.D.   On: 12/25/2020 08:01    ____________________________________________   PROCEDURES  Procedure(s) performed (including Critical Care):  Procedures   ____________________________________________   INITIAL IMPRESSION / ASSESSMENT AND PLAN / ED COURSE  As part of my medical decision making, I reviewed the following data within the electronic medical record, if available:  Nursing notes reviewed and incorporated, Labs reviewed, EKG interpreted, Old chart reviewed, Radiograph reviewed and Notes from prior ED visits reviewed and incorporated      Findings: One-Part Proximal Humerus Fracture without significant displacement Patient does not currently demonstrate complications of fracture such as compartment syndrome, arterial or nerve injury. Rx: Sling and Swathe Dispo: As patient will need evaluation for rehab, he will require admission in the internal medicine service for further evaluation and management.     ____________________________________________   FINAL CLINICAL IMPRESSION(S) / ED DIAGNOSES  Final diagnoses:  Closed fracture of proximal end of right humerus, unspecified fracture morphology, initial encounter     ED Discharge Orders     None        Note:  This document was prepared using Dragon voice recognition software and may include unintentional dictation errors.     Naaman Plummer, MD 12/25/20 [phone]

## 2020-12-25 NOTE — H&P (Signed)
History and Physical    Matthew [name] IBB:[id number] DOB: 18-Oct-1930 DOA: 12/25/2020  PCP: Jodi Marble, MD   Patient coming from: Home  I have personally briefly reviewed patient's old medical records in [place]  Chief Complaint: Fall  HPI: Matthew [name] is a 87 y.o. male with medical history significant for diabetes mellitus with complications of end-stage renal disease on hemodialysis (dialysis days are Tuesday/Thursday/Saturday), coronary artery disease status post CABG, hypertension who presents to the ER for evaluation after he fell this morning while getting ready to go to dialysis. Patient states that he leaned over to pick up a sock when he lost his balance and fell landing on his right shoulder.  He denied feeling dizzy or lightheaded and did not hit his head.  There was no loss of consciousness. He denies having any chest pain, no shortness of breath, no nausea, no vomiting, no dizziness, no lightheadedness, no headache, no abdominal pain, no changes in his bowel habits, no fever, no chills, no cough, no urinary symptoms, no palpitations or diaphoresis. Labs show sodium 135, potassium 5.2, chloride 100, bicarb 28, glucose 129, BUN 53, creatinine 3.45, calcium 9.3, alkaline phosphatase 94, albumin 3.4, AST 35, ALT 35, total protein 6.6, white count 7.8, hemoglobin 11.5, hematocrit 34, MCV 99.1, RDW 13.1, platelet count 136 Respiratory viral panel is negative X-ray of the right humerus shows a comminuted and displaced fracture proximal right humerus involving the humeral neck and greater tuberosity. X-ray of the right shoulder shows a comminuted fracture proximal right humerus   ED Course: Patient is an 87 year old male who sustained a mechanical fall and has a right humeral fracture. Patient was placed in a sling in the emergency room. He is right-handed and has a new right humeral fracture.  This will definitely affect the patient's mobility. Patient will be  assessed by physical therapy and may require subacute rehab.    Review of Systems: As per HPI otherwise all other systems reviewed and negative.    Past Medical History:  Diagnosis Date   Anemia    Arthritis    Broken arm 05/2013   Left   CHF (congestive heart failure) (HCC)    Chronic kidney disease    Coronary artery disease    Diabetes mellitus without complication (HCC)    Dyspnea    DOE   Edema    FEET/LEGS OCCAS   Hypertension    Myocardial infarction ([place])    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   left frontal scalp, [facility]   Squamous cell carcinoma of skin 05/05/2020   left temporal scalp, in situ, [facility] 05/13/20    Past Surgical History:  Procedure Laterality Date   CATARACT EXTRACTION W/PHACO Left 07/26/2017   Procedure: CATARACT EXTRACTION PHACO AND INTRAOCULAR LENS PLACEMENT ([place]);  Surgeon: Birder Robson, MD;  Location: [facility] ORS;  Service: Ophthalmology;  Laterality: Left;  [place]   00:45.8 AP%  13.1 CDE  6.00 Fluid Pack Lot # [id number]   CATARACT EXTRACTION W/PHACO Right 08/17/2017   Procedure: CATARACT EXTRACTION PHACO AND INTRAOCULAR LENS PLACEMENT ([facility]);  Surgeon: Birder Robson, MD;  Location: [facility] ORS;  Service: Ophthalmology;  Laterality: Right;  [place]  01:30 AP% 16.8 CDE 15.24 Fluid pack lot # [id number]   CHOLECYSTECTOMY     CORONARY ANGIOPLASTY     STENTS   CORONARY ARTERY BYPASS GRAFT     DIALYSIS/PERMA CATHETER INSERTION N/A 02/09/2018   Procedure: DIALYSIS/PERMA CATHETER INSERTION;  Surgeon: Algernon Huxley, MD;  Location:  [facility];  Service: Cardiovascular;  Laterality: N/A;   FRACTURE SURGERY     ARM   INSERTION OF DIALYSIS CATHETER Right 11/03/2020   Procedure: INSERTION OF Perm Cath in the RIGHT INTERNAL JUGULAR;  Surgeon: Algernon Huxley, MD;  Location: [facility] ORS;  Service: Vascular;  Laterality: Right;   LEFT HEART CATHETERIZATION WITH CORONARY/GRAFT ANGIOGRAM Left 12/04/2013   Procedure: LEFT HEART CATHETERIZATION WITH Beatrix Fetters;  Surgeon: Leonie Man, MD;  Location: [facility] CATH LAB;  Service: Cardiovascular;  Laterality: Left;   PERCUTANEOUS CORONARY STENT INTERVENTION (PCI-S) N/A 12/06/2013   Procedure: PERCUTANEOUS CORONARY STENT INTERVENTION (PCI-S);  Surgeon: Sinclair Grooms, MD;  Location: [facility] CATH LAB;  Service: Cardiovascular;  Laterality: N/A;   REMOVAL OF A DIALYSIS CATHETER N/A 11/03/2020   Procedure: REMOVAL OF A DIALYSIS CATHETER;  Surgeon: Algernon Huxley, MD;  Location: [facility] ORS;  Service: Vascular;  Laterality: N/A;   VEIN BYPASS SURGERY     [place] [facility]      reports that he has quit smoking. His smoking use included cigarettes. He has never used smokeless tobacco. He reports current alcohol use of about 1.0 standard drink of alcohol per week. He reports that he does not use drugs.  Allergies  Allergen Reactions   Other     Other reaction(s): Myalgias (intolerance), Other (See Comments) Statin Drugs  Statin Drugs     Amoxicillin Diarrhea    Family History  Problem Relation Age of Onset   Heart attack Father 42       died first MI at age 54   Heart failure Mother       Prior to Admission medications   Medication Sig Start Date End Date Taking? Authorizing Provider  acyclovir ointment (ZOVIRAX) 5 % Apply 1 application topically daily as needed (shingles).   Yes [provider]  amiodarone (PACERONE) 200 MG tablet Take 200 mg by mouth daily.   Yes [provider]  cholecalciferol (VITAMIN D) 25 MCG (1000 UNIT) tablet Take 1,000 Units by mouth daily with breakfast.   Yes [provider]  clopidogrel (PLAVIX) 75 MG tablet Take 1 tablet (75 mg total) by mouth daily with breakfast. 12/07/13  Yes Almyra Deforest, PA  ezetimibe (ZETIA) 10 MG tablet Take 10 mg by mouth daily before breakfast.   Yes [provider]  ferrous sulfate 325 (65 FE) MG tablet Take 325 mg by mouth 2 (two) times daily with a meal.    Yes [provider]  fluticasone  (FLONASE) 50 MCG/ACT nasal spray Place 2 sprays into both nostrils daily as needed for allergies or rhinitis.   Yes [provider]  furosemide (LASIX) 40 MG tablet Take 2 tablets (80 mg total) by mouth 2 (two) times daily. Patient taking differently: Take 40 mg by mouth daily. 01/15/18  Yes Pyreddy, Reatha Harps, MD  gabapentin (NEURONTIN) 300 MG capsule Take 300 mg by mouth 3 (three) times daily.   Yes [provider]  glipiZIDE (GLUCOTROL) 5 MG tablet Take 2.5 mg by mouth daily before breakfast.   Yes [provider]  levocetirizine (XYZAL) 5 MG tablet Take 10 mg by mouth every morning.    Yes [provider]  levothyroxine (SYNTHROID) 75 MCG tablet Take 75 mcg by mouth daily before breakfast.   Yes [provider]  metoprolol succinate (TOPROL-XL) 50 MG 24 hr tablet Take 25 mg by mouth daily with lunch.   Yes [provider]  midodrine (PROAMATINE) 10  MG tablet Take 10 mg by mouth daily. (Take 1 hour before peritoneal dialysis)   Yes [provider]  multivitamin (RENA-VIT) TABS tablet Take 1 tablet by mouth daily.   Yes [provider]  nitroGLYCERIN (NITROSTAT) 0.4 MG SL tablet Place 0.4 mg under the tongue every 5 (five) minutes as needed for chest pain.   Yes [provider]  Omega-3 Fatty Acids (FISH OIL) 1000 MG CAPS Take 4,000 mg by mouth daily with lunch.   Yes [provider]  potassium chloride SA (KLOR-CON) 20 MEQ tablet Take 20 mEq by mouth daily.   Yes [provider]  rosuvastatin (CRESTOR) 40 MG tablet Take 40 mg by mouth at bedtime. 04/21/19  Yes [provider]    Physical Exam: Vitals:   12/25/20 0730 12/25/20 0800 12/25/20 1103 12/25/20 1200  BP: (!) 101/56 (!) 114/56 116/62 117/61  Pulse: 63 61 66 66  Resp: 18 15 14 11   Temp:      TempSrc:      SpO2: 97% 97% 98% 96%  Weight:      Height:         Vitals:   12/25/20 0730 12/25/20 0800 12/25/20 1103 12/25/20 1200   BP: (!) 101/56 (!) 114/56 116/62 117/61  Pulse: 63 61 66 66  Resp: 18 15 14 11   Temp:      TempSrc:      SpO2: 97% 97% 98% 96%  Weight:      Height:          Constitutional: Alert and oriented x 3 . Not in any apparent distress HEENT:      Head: Normocephalic and atraumatic.         Eyes: PERLA, EOMI, Conjunctivae are normal. Sclera is non-icteric.       Mouth/Throat: Mucous membranes are moist.       Neck: Supple with no signs of meningismus. Cardiovascular: Regular rate and rhythm. No murmurs, gallops, or rubs. 2+ symmetrical distal pulses are present . No JVD. No LE edema Respiratory: Respiratory effort normal .Lungs sounds clear bilaterally. No wheezes, crackles, or rhonchi.  Gastrointestinal: Soft, non tender, and non distended with positive bowel sounds.  Genitourinary: No CVA tenderness. Musculoskeletal: Decreased range of motion in the right shoulder, right shoulder in sling Neurologic:  Face is symmetric. Moving all extremities. No gross focal neurologic deficits . Skin: Skin is warm, dry.  No rash or ulcers Psychiatric: Mood and affect are normal    Labs on Admission: I have personally reviewed following labs and imaging studies  CBC: Recent Labs  Lab 12/25/20 0831  WBC 7.8  NEUTROABS 5.6  HGB 11.5*  HCT 34.0*  MCV 99.1  PLT 503*   Basic Metabolic Panel: Recent Labs  Lab 12/25/20 0831  NA 135  K 5.2*  CL 100  CO2 28  GLUCOSE 129*  BUN 53*  CREATININE 3.45*  CALCIUM 9.3   GFR: Estimated Creatinine Clearance: 17.9 mL/min (A) (by C-G formula based on SCr of 3.45 mg/dL (H)). Liver Function Tests: Recent Labs  Lab 12/25/20 0831  AST 35  ALT 34  ALKPHOS 94  BILITOT 0.9  PROT 6.6  ALBUMIN 3.4*   No results for input(s): LIPASE, AMYLASE in the last 168 hours. No results for input(s): AMMONIA in the last 168 hours. Coagulation Profile: No results for input(s): INR, PROTIME in the last 168 hours. Cardiac Enzymes: No results for input(s):  CKTOTAL, CKMB, CKMBINDEX, TROPONINI in the last 168 hours. BNP (last 3 results)  No results for input(s): PROBNP in the last 8760 hours. HbA1C: No results for input(s): HGBA1C in the last 72 hours. CBG: Recent Labs  Lab 12/25/20 0819  GLUCAP 115*   Lipid Profile: No results for input(s): CHOL, HDL, LDLCALC, TRIG, CHOLHDL, LDLDIRECT in the last 72 hours. Thyroid Function Tests: No results for input(s): TSH, T4TOTAL, FREET4, T3FREE, THYROIDAB in the last 72 hours. Anemia Panel: No results for input(s): VITAMINB12, FOLATE, FERRITIN, TIBC, IRON, RETICCTPCT in the last 72 hours. Urine analysis:    Component Value Date/Time   COLORURINE YELLOW (A) 05/24/2019 0021   APPEARANCEUR CLEAR (A) 05/24/2019 0021   LABSPEC 1.012 05/24/2019 0021   PHURINE 6.0 05/24/2019 0021   GLUCOSEU NEGATIVE 05/24/2019 0021   HGBUR NEGATIVE 05/24/2019 0021   BILIRUBINUR NEGATIVE 05/24/2019 0021   KETONESUR NEGATIVE 05/24/2019 0021   PROTEINUR 30 (A) 05/24/2019 0021   NITRITE NEGATIVE 05/24/2019 0021   LEUKOCYTESUR NEGATIVE 05/24/2019 0021    Radiological Exams on Admission: DG Shoulder Right  Result Date: 12/25/2020 CLINICAL DATA:  Fall.  Shoulder pain EXAM: RIGHT SHOULDER - 2+ VIEW COMPARISON:  None. FINDINGS: Fracture proximal right humerus involving the humeral neck and greater tuberosity. Humeral head normally located. Degenerative change of the greater tuberosity and AC joint. Right jugular central venous catheter noted. IMPRESSION: Comminuted fracture proximal right humerus. Electronically Signed   By: Franchot Gallo M.D.   On: 12/25/2020 08:02   DG Humerus Right  Result Date: 12/25/2020 CLINICAL DATA:  Fall EXAM: RIGHT HUMERUS - 2+ VIEW COMPARISON:  Right shoulder 12/25/2020 FINDINGS: Fracture right humeral neck. Comminuted fracture with mild displacement. Humeral head normally located. Degenerative change in the shoulder and AC joint. IMPRESSION: Comminuted and displaced fracture proximal right humerus  involving the humeral neck and greater tuberosity. Electronically Signed   By: Franchot Gallo M.D.   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 [facility])   CAD (coronary artery disease)   Fall     Status post mechanical fall With proximal humerus fracture Immobilize right upper extremity Pain control Consult orthopedic surgery Fall precautions   Diabetes mellitus with complications of end-stage renal disease on hemodialysis Maintain consistent carbohydrate diet Hold glipizide Glycemic control with sliding scale insulin     Coronary artery disease status post CABG Continue metoprolol, statins, Zetia and Plavix   Hypothyroidism Continue Synthroid  DVT prophylaxis: Heparin Code Status: Do not resuscitate Family Communication: Greater than 50% of time was spent discussing patient's condition and plan of care with him and his daughter at the bedside.  All questions and concerns have been addressed.  They verbalized understanding and agree with the plan.  CODE STATUS was discussed and patient is a DO NOT RESUSCITATE Disposition Plan: Back to previous home environment Consults called: Nephrology/physical therapy/orthopedic surgery Status: Observation    Dion Parrow MD [facility]     12/25/2020, 12:42 PM

## 2020-12-25 NOTE — ED Notes (Signed)
Provided pt with sandwich tray and juice.

## 2020-12-25 NOTE — Progress Notes (Addendum)
BP 93/56 during dialysis, NP made aware, goal decreased to1, BFR 350 and instructed to keep MAP >60.  Patient asymptomatic, denies light headness or dizziness

## 2020-12-25 NOTE — Progress Notes (Addendum)
[facility]  ROUNDING NOTE   Subjective:   [name] was admitted to [facility] on 12/25/2020 for Fall [W19.XXXA]  Last hemodialysis treatment was getting dressed for dialysis this morning and fell. He fractured his humerus.   Objective:  Vital signs in last 24 hours:  Temp:  [98.3 F (36.8 C)] 98.3 F (36.8 C) (08/04 0623) Pulse Rate:  [61-70] 66 (08/04 1103) Resp:  [14-18] 14 (08/04 1103) BP: (95-116)/(52-63) 116/62 (08/04 1103) SpO2:  [97 %-100 %] 98 % (08/04 1103) Weight:  [97.5 kg] 97.5 kg (08/04 0627)  Weight change:  Filed Weights   12/25/20 0627  Weight: 97.5 kg    Intake/Output: No intake/output data recorded.   Intake/Output this shift:  No intake/output data recorded.  Physical Exam: General: NAD,   Head: Normocephalic, atraumatic. Moist oral mucosal membranes  Eyes: Anicteric, PERRL  Neck: Supple, trachea midline  Lungs:  Clear to auscultation  Heart: Regular rate and rhythm  Abdomen:  Soft, nontender,   Extremities:  no peripheral edema.  Neurologic: Nonfocal, moving all four extremities  Skin: No lesions  Access: RIJ permcath    Basic Metabolic Panel: Recent Labs  Lab 12/25/20 0831  NA 135  K 5.2*  CL 100  CO2 28  GLUCOSE 129*  BUN 53*  CREATININE 3.45*  CALCIUM 9.3    Liver Function Tests: Recent Labs  Lab 12/25/20 0831  AST 35  ALT 34  ALKPHOS 94  BILITOT 0.9  PROT 6.6  ALBUMIN 3.4*   No results for input(s): LIPASE, AMYLASE in the last 168 hours. No results for input(s): AMMONIA in the last 168 hours.  CBC: Recent Labs  Lab 12/25/20 0831  WBC 7.8  NEUTROABS 5.6  HGB 11.5*  HCT 34.0*  MCV 99.1  PLT 136*    Cardiac Enzymes: No results for input(s): CKTOTAL, CKMB, CKMBINDEX, TROPONINI in the last 168 hours.  BNP: Invalid input(s): POCBNP  CBG: Recent Labs  Lab 12/25/20 0819  GLUCAP 115*    Microbiology: Results for orders placed or performed during the hospital encounter of 12/25/20   Resp Panel by RT-PCR (Flu A&B, Covid) Nasopharyngeal Swab     Status: None   Collection Time: 12/25/20  8:46 AM   Specimen: Nasopharyngeal Swab; Nasopharyngeal(NP) swabs in vial transport medium  Result Value Ref Range Status   SARS Coronavirus 2 by RT PCR NEGATIVE NEGATIVE Final    Comment: (NOTE) SARS-CoV-2 target nucleic acids are NOT DETECTED.  The SARS-CoV-2 RNA is generally detectable in upper respiratory specimens during the acute phase of infection. The lowest concentration of SARS-CoV-2 viral copies this assay can detect is 138 copies/mL. A negative result does not preclude SARS-Cov-2 infection and should not be used as the sole basis for treatment or other patient management decisions. A negative result may occur with  improper specimen collection/handling, submission of specimen other than nasopharyngeal swab, presence of viral mutation(s) within the areas targeted by this assay, and inadequate number of viral copies(<138 copies/mL). A negative result must be combined with clinical observations, patient history, and epidemiological information. The expected result is Negative.  Fact Sheet for Patients:  EntrepreneurPulse.com.au  Fact Sheet for Healthcare Providers:  IncredibleEmployment.be  This test is no t yet approved or cleared by the [place] FDA and  has been authorized for detection and/or diagnosis of SARS-CoV-2 by FDA under an Emergency Use Authorization (EUA). This EUA will remain  in effect (meaning this test can be used) for the duration of the COVID-19  declaration under Section 564(b)(1) of the Act, 21 U.S.C.section 360bbb-3(b)(1), unless the authorization is terminated  or revoked sooner.       Influenza A by PCR NEGATIVE NEGATIVE Final   Influenza B by PCR NEGATIVE NEGATIVE Final    Comment: (NOTE) The Xpert Xpress SARS-CoV-2/FLU/RSV plus assay is intended as an aid in the diagnosis of influenza from  Nasopharyngeal swab specimens and should not be used as a sole basis for treatment. Nasal washings and aspirates are unacceptable for Xpert Xpress SARS-CoV-2/FLU/RSV testing.  Fact Sheet for Patients: EntrepreneurPulse.com.au  Fact Sheet for Healthcare Providers: IncredibleEmployment.be  This test is not yet approved or cleared by the [place] FDA and has been authorized for detection and/or diagnosis of SARS-CoV-2 by FDA under an Emergency Use Authorization (EUA). This EUA will remain in effect (meaning this test can be used) for the duration of the COVID-19 declaration under Section 564(b)(1) of the Act, 21 U.S.C. section 360bbb-3(b)(1), unless the authorization is terminated or revoked.  Performed at [facility], [address] [place], [place] [address]     Coagulation Studies: No results for input(s): LABPROT, INR in the last 72 hours.  Urinalysis: No results for input(s): COLORURINE, LABSPEC, PHURINE, GLUCOSEU, HGBUR, BILIRUBINUR, KETONESUR, PROTEINUR, UROBILINOGEN, NITRITE, LEUKOCYTESUR in the last 72 hours.  Invalid input(s): APPERANCEUR    Imaging: DG Shoulder Right  Result Date: 12/25/2020 CLINICAL DATA:  Fall.  Shoulder pain EXAM: RIGHT SHOULDER - 2+ VIEW COMPARISON:  None. FINDINGS: Fracture proximal right humerus involving the humeral neck and greater tuberosity. Humeral head normally located. Degenerative change of the greater tuberosity and AC joint. Right jugular central venous catheter noted. IMPRESSION: Comminuted fracture proximal right humerus. Electronically Signed   By: Franchot Gallo M.D.   On: 12/25/2020 08:02   DG Humerus Right  Result Date: 12/25/2020 CLINICAL DATA:  Fall EXAM: RIGHT HUMERUS - 2+ VIEW COMPARISON:  Right shoulder 12/25/2020 FINDINGS: Fracture right humeral neck. Comminuted fracture with mild displacement. Humeral head normally located. Degenerative change in the shoulder and AC joint.  IMPRESSION: Comminuted and displaced fracture proximal right humerus involving the humeral neck and greater tuberosity. Electronically Signed   By: Franchot Gallo M.D.   On: 12/25/2020 08:01     Medications:     Chlorhexidine Gluconate Cloth  6 each Topical Q0600   HYDROcodone-acetaminophen, morphine injection  Assessment/ Plan:  [name] is a 87 y.o. white male with end stage renal disease on hemodialysis, hypertension, atrial fibrillation, coronary artery disease status post CABG, diabetes mellitus type II, hypothyroidism, who is admitted to [facility] on 12/25/2020 for Fall [W19.XXXA] Found to have a right humerus fracture.   [facility] TTS [address] RIJ permcath 98.5kg  End Stage Renal Disease: TTS, schedule dialysis for today. Orders prepared.   Hypertension: 117/61. Home regimen of midodrine before dialysis and for hypertension he takes metoprolol and furosemide. Patient may need to hold her bp medications before dialysis treatment.   Anemia of chronic kidney disease: hemoglobin at goal 11.5  Secondary Hyperparathyroidism: outpatient PTH on 7/12 was low at 134. Not currently on binders. Hold vitamin D agents.     LOS: 0 Chesley Veasey 8/4/202212:07 PM

## 2020-12-25 NOTE — ED Notes (Addendum)
Sling applied by RN and EDT.

## 2020-12-25 NOTE — ED Notes (Signed)
Pt's HOB adjusted for pt comfort

## 2020-12-25 NOTE — ED Notes (Signed)
X ray in room.

## 2020-12-25 NOTE — Progress Notes (Signed)
Patient tolerated 3 hour HD treatment via CVC catheter with no adverse effects noted. 1 L total UF removed

## 2020-12-25 NOTE — Progress Notes (Addendum)
PT Cancellation Note  Patient Details Name: Matthew [name] MRN: [id number] DOB: [birth year]   Cancelled Treatment:    Reason Eval/Treat Not Completed: Patient not medically ready. PT orders received and pt chart reviewed. Per attending MD note, pt pending ortho consult for R humeral fx. Pt also off floor for HD. Will follow up with therapy intervention s/p ortho consult as appropriate.    Herminio Commons, PT, DPT 1:37 PM,12/25/20

## 2020-12-25 NOTE — ED Notes (Signed)
BG resulted 115.

## 2020-12-26 ENCOUNTER — Inpatient Hospital Stay: Payer: Medicare Other

## 2020-12-26 ENCOUNTER — Encounter: Payer: Self-pay | Admitting: Internal Medicine

## 2020-12-26 ENCOUNTER — Encounter (INDEPENDENT_AMBULATORY_CARE_PROVIDER_SITE_OTHER): Payer: Self-pay

## 2020-12-26 DIAGNOSIS — N2581 Secondary hyperparathyroidism of renal origin: Secondary | ICD-10-CM | POA: Diagnosis present

## 2020-12-26 DIAGNOSIS — N186 End stage renal disease: Secondary | ICD-10-CM

## 2020-12-26 DIAGNOSIS — L89312 Pressure ulcer of right buttock, stage 2: Secondary | ICD-10-CM | POA: Diagnosis present

## 2020-12-26 DIAGNOSIS — R251 Tremor, unspecified: Secondary | ICD-10-CM

## 2020-12-26 DIAGNOSIS — I132 Hypertensive heart and chronic kidney disease with heart failure and with stage 5 chronic kidney disease, or end stage renal disease: Secondary | ICD-10-CM | POA: Diagnosis present

## 2020-12-26 DIAGNOSIS — Y92008 Other place in unspecified non-institutional (private) residence as the place of occurrence of the external cause: Secondary | ICD-10-CM | POA: Diagnosis not present

## 2020-12-26 DIAGNOSIS — I951 Orthostatic hypotension: Secondary | ICD-10-CM | POA: Diagnosis present

## 2020-12-26 DIAGNOSIS — Z951 Presence of aortocoronary bypass graft: Secondary | ICD-10-CM | POA: Diagnosis not present

## 2020-12-26 DIAGNOSIS — I5022 Chronic systolic (congestive) heart failure: Secondary | ICD-10-CM

## 2020-12-26 DIAGNOSIS — Z7902 Long term (current) use of antithrombotics/antiplatelets: Secondary | ICD-10-CM | POA: Diagnosis not present

## 2020-12-26 DIAGNOSIS — L89322 Pressure ulcer of left buttock, stage 2: Secondary | ICD-10-CM | POA: Diagnosis present

## 2020-12-26 DIAGNOSIS — W19XXXD Unspecified fall, subsequent encounter: Secondary | ICD-10-CM | POA: Diagnosis not present

## 2020-12-26 DIAGNOSIS — I252 Old myocardial infarction: Secondary | ICD-10-CM | POA: Diagnosis not present

## 2020-12-26 DIAGNOSIS — Z7984 Long term (current) use of oral hypoglycemic drugs: Secondary | ICD-10-CM | POA: Diagnosis not present

## 2020-12-26 DIAGNOSIS — Z87891 Personal history of nicotine dependence: Secondary | ICD-10-CM | POA: Diagnosis not present

## 2020-12-26 DIAGNOSIS — S42251A Displaced fracture of greater tuberosity of right humerus, initial encounter for closed fracture: Secondary | ICD-10-CM | POA: Diagnosis present

## 2020-12-26 DIAGNOSIS — D6959 Other secondary thrombocytopenia: Secondary | ICD-10-CM | POA: Diagnosis present

## 2020-12-26 DIAGNOSIS — Z66 Do not resuscitate: Secondary | ICD-10-CM | POA: Diagnosis present

## 2020-12-26 DIAGNOSIS — S42294K Other nondisplaced fracture of upper end of right humerus, subsequent encounter for fracture with nonunion: Secondary | ICD-10-CM | POA: Diagnosis not present

## 2020-12-26 DIAGNOSIS — E1122 Type 2 diabetes mellitus with diabetic chronic kidney disease: Secondary | ICD-10-CM

## 2020-12-26 DIAGNOSIS — Z992 Dependence on renal dialysis: Secondary | ICD-10-CM

## 2020-12-26 DIAGNOSIS — W010XXA Fall on same level from slipping, tripping and stumbling without subsequent striking against object, initial encounter: Secondary | ICD-10-CM | POA: Diagnosis present

## 2020-12-26 DIAGNOSIS — I959 Hypotension, unspecified: Secondary | ICD-10-CM | POA: Diagnosis present

## 2020-12-26 DIAGNOSIS — I2510 Atherosclerotic heart disease of native coronary artery without angina pectoris: Secondary | ICD-10-CM | POA: Diagnosis present

## 2020-12-26 DIAGNOSIS — E039 Hypothyroidism, unspecified: Secondary | ICD-10-CM | POA: Diagnosis present

## 2020-12-26 DIAGNOSIS — S42201A Unspecified fracture of upper end of right humerus, initial encounter for closed fracture: Secondary | ICD-10-CM

## 2020-12-26 DIAGNOSIS — I25810 Atherosclerosis of coronary artery bypass graft(s) without angina pectoris: Secondary | ICD-10-CM | POA: Diagnosis not present

## 2020-12-26 DIAGNOSIS — I5023 Acute on chronic systolic (congestive) heart failure: Secondary | ICD-10-CM

## 2020-12-26 DIAGNOSIS — D631 Anemia in chronic kidney disease: Secondary | ICD-10-CM | POA: Diagnosis present

## 2020-12-26 DIAGNOSIS — E871 Hypo-osmolality and hyponatremia: Secondary | ICD-10-CM | POA: Diagnosis present

## 2020-12-26 DIAGNOSIS — I480 Paroxysmal atrial fibrillation: Secondary | ICD-10-CM | POA: Diagnosis not present

## 2020-12-26 DIAGNOSIS — Z20822 Contact with and (suspected) exposure to covid-19: Secondary | ICD-10-CM | POA: Diagnosis present

## 2020-12-26 DIAGNOSIS — I120 Hypertensive chronic kidney disease with stage 5 chronic kidney disease or end stage renal disease: Secondary | ICD-10-CM

## 2020-12-26 DIAGNOSIS — Z85828 Personal history of other malignant neoplasm of skin: Secondary | ICD-10-CM | POA: Diagnosis not present

## 2020-12-26 DIAGNOSIS — Z79899 Other long term (current) drug therapy: Secondary | ICD-10-CM | POA: Diagnosis not present

## 2020-12-26 DIAGNOSIS — I9589 Other hypotension: Secondary | ICD-10-CM | POA: Diagnosis present

## 2020-12-26 LAB — BASIC METABOLIC PANEL
Anion gap: 6 (ref 5–15)
BUN: 37 mg/dL — ABNORMAL HIGH (ref 8–23)
CO2: 29 mmol/L (ref 22–32)
Calcium: 8.6 mg/dL — ABNORMAL LOW (ref 8.9–10.3)
Chloride: 99 mmol/L (ref 98–111)
Creatinine, Ser: 3.49 mg/dL — ABNORMAL HIGH (ref 0.61–1.24)
GFR, Estimated: 16 mL/min — ABNORMAL LOW (ref >60)
Glucose, Bld: 100 mg/dL — ABNORMAL HIGH (ref 70–99)
Potassium: 4.6 mmol/L (ref 3.5–5.1)
Sodium: 134 mmol/L — ABNORMAL LOW (ref 135–145)

## 2020-12-26 LAB — GLUCOSE, CAPILLARY
Glucose-Capillary: 104 mg/dL — ABNORMAL HIGH (ref 70–99)
Glucose-Capillary: 139 mg/dL — ABNORMAL HIGH (ref 70–99)
Glucose-Capillary: 124 mg/dL — ABNORMAL HIGH (ref 70–99)
Glucose-Capillary: 120 mg/dL — ABNORMAL HIGH (ref 70–99)

## 2020-12-26 MED ORDER — METOPROLOL SUCCINATE ER 25 MG PO TB24: 12.5000 mg | ORAL_TABLET | Freq: Every day | ORAL | Status: DC

## 2020-12-26 MED ORDER — MIDODRINE HCL 5 MG PO TABS
10.0000 mg | ORAL_TABLET | Freq: Four times a day (QID) | ORAL | Status: DC
  Administered 2020-12-26 – 2020-12-28 (×9): 10 mg via ORAL
  Filled 2020-12-26 (×9): qty 2

## 2020-12-26 MED ORDER — METOPROLOL SUCCINATE ER 25 MG PO TB24
12.5000 mg | ORAL_TABLET | Freq: Every day | ORAL | Status: DC
  Administered 2020-12-26 – 2021-01-05 (×10): 12.5 mg via ORAL
  Filled 2020-12-26 (×11): qty 1

## 2020-12-26 MED ORDER — MORPHINE SULFATE (PF) 2 MG/ML IV SOLN
1.0000 mg | INTRAVENOUS | Status: DC | PRN
  Administered 2020-12-28: 1 mg via INTRAVENOUS
  Filled 2020-12-26: qty 1

## 2020-12-26 MED ORDER — MIDODRINE HCL 5 MG PO TABS
10.0000 mg | ORAL_TABLET | Freq: Three times a day (TID) | ORAL | Status: DC
  Administered 2020-12-26 (×2): 10 mg via ORAL
  Filled 2020-12-26 (×2): qty 2

## 2020-12-26 MED ORDER — HYDROCODONE-ACETAMINOPHEN 5-325 MG PO TABS
1.0000 | ORAL_TABLET | ORAL | Status: DC | PRN
  Administered 2020-12-28 – 2020-12-29 (×2): 1 via ORAL
  Filled 2020-12-26 (×2): qty 1

## 2020-12-26 NOTE — Clinical Social Work Note (Signed)
CSW acknowledges consult for SNF placement. Will follow for PT recommendations.  Dayton Scrape, [place]

## 2020-12-26 NOTE — TOC Initial Note (Signed)
Transition of Care [facility]) - Initial/Assessment Note    Patient Details  Name: Matthew [name] MRN: [id number] Date of Birth: 11/03/1930  Transition of Care [facility]) CM/SW Contact:    Candie Chroman, LCSW Phone Number: 12/26/2020, 3:22 PM  Clinical Narrative:  This CSW working remote today. Called into room and daughter Horris Latino answered. Patient in CT. CSW introduced role and explained that PT recommendations would be discussed. She is agreeable to SNF placement. First preference is [facility]. They have had bad experiences at [facility], [facility], and [facility] so she does not want referral sent there. [facility] will not have a bed over the weekend so admissions coordinator will see what she has available on Monday. Also sent referral to [facility] and [facility]. Patient will need Medicare waiver if discharged before Monday. Explained this to daughter. Patient is fully vaccinated with two boosters. No further concerns. CSW encouraged patient's daughter to contact CSW as needed. CSW will continue to follow patient and his daughter for support and facilitate discharge to SNF once medically stable.                Expected Discharge Plan: Skilled Nursing Facility Barriers to Discharge: Continued Medical Work up   Patient Goals and CMS Choice Patient states their goals for this hospitalization and ongoing recovery are:: Patient not available.      Expected Discharge Plan and Services Expected Discharge Plan: [place] Choice: [place] arrangements for the past 2 months: Single Family Home                                      Prior Living Arrangements/Services Living arrangements for the past 2 months: Single Family Home Lives with:: Adult Children Patient language and need for interpreter reviewed:: Yes Do you feel safe going back to the place where you live?: Yes      Need for Family  Participation in Patient Care: Yes (Comment) Care giver support system in place?: Yes (comment) Current home services: DME Criminal Activity/Legal Involvement Pertinent to Current Situation/Hospitalization: No - Comment as needed  Activities of Daily Living Home Assistive Devices/Equipment: Cane (specify quad or straight) ADL Screening (condition at time of admission) Patient's cognitive ability adequate to safely complete daily activities?: Yes Is the patient deaf or have difficulty hearing?: Yes Does the patient have difficulty seeing, even when wearing glasses/contacts?: No Does the patient have difficulty concentrating, remembering, or making decisions?: No Patient able to express need for assistance with ADLs?: Yes Does the patient have difficulty dressing or bathing?: No Independently performs ADLs?: No Communication: Needs assistance Is this a change from baseline?: Change from baseline, expected to last >3 days Dressing (OT): Needs assistance Is this a change from baseline?: Change from baseline, expected to last >3 days Grooming: Independent Feeding: Independent Bathing: Independent Toileting: Needs assistance Is this a change from baseline?: Change from baseline, expected to last >3days In/Out Bed: Independent Walks in Home: Independent Does the patient have difficulty walking or climbing stairs?: Yes Weakness of Legs: None Weakness of Arms/Hands: Right  Permission Sought/Granted Permission sought to share information with : Facility Sport and exercise psychologist    Share Information with NAME: Vinnie Langton  Permission granted to share info w AGENCY: SNF's  Permission granted to share info w Relationship: Daughter/HCPOA  Permission granted to share info w  Contact Information: [phone]  Emotional Assessment Appearance:: Appears stated age Attitude/Demeanor/Rapport: Unable to Assess Affect (typically observed): Unable to Assess Orientation: : Oriented to Self, Oriented  to Place, Oriented to  Time, Oriented to Situation Alcohol / Substance Use: Not Applicable Psych Involvement: No (comment)  Admission diagnosis:  Fall [W19.XXXA] Closed fracture of proximal end of right humerus, unspecified fracture morphology, initial encounter [S42.201A] Hypotension [I95.9] Patient Active Problem List   Diagnosis Date Noted   Hypotension 12/26/2020   Tremor    Hypothyroidism    Chronic systolic CHF (congestive heart failure) ([place])    Fall 12/25/2020   Proximal humerus fracture 12/25/2020   Malposition of peritoneal dialysis catheter ([place]) 11/01/2020   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   Sepsis ([place]) 02/15/2018   Fluid overload 01/10/2018   Hypoglycemia 12/09/2017   Chronic kidney disease 07/18/2015   GERD (gastroesophageal reflux disease) 07/18/2015   CAD (coronary artery disease) 12/04/2013   Unstable angina; Class III Angina 12/03/2013   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 ([place]) 12/03/2013   HLD (hyperlipidemia) 12/03/2013   Heart disease 12/03/2013   PCP:  Jodi Marble, MD Pharmacy:   [facility] [address], [place] [address] Phone: [phone] Fax: [place], [place]. [place] [address] Phone: [phone] Fax: [phone]     Social Determinants of Health (SDOH) Interventions    Readmission Risk Interventions No flowsheet data found.

## 2020-12-26 NOTE — Progress Notes (Signed)
Patient ID: Matthew [name], male   DOB: 1930/10/15, 87 y.o.   MRN: [id number]  CT cervical spine no acute fracture.  CT scan of the head no acute stroke or bleed.  Will obtain an MRI of the brain.  Increase midodrine to 4 times a day dosing  Dr. Leslye Peer

## 2020-12-26 NOTE — Progress Notes (Signed)
Pt and family insisting that the daughter stay because she was able to stay the night last night. Jonelle Sidle  discussed with Sherryle Lis, pt admitted with a fall and is in a sling device. Approval to stay.

## 2020-12-26 NOTE — Evaluation (Signed)
Physical Therapy Evaluation Patient Details Name: Matthew [name] MRN: [id number] DOB: [birth year] Today's Date: 12/26/2020   History of Present Illness  Matthew [name] is a 87 y.o. male with medical history significant for diabetes mellitus with complications of end-stage renal disease on hemodialysis (dialysis days are Tuesday/Thursday/Saturday), coronary artery disease status post CABG, hypertension who presents to the ER for evaluation after he fell this morning while getting ready to go to dialysis.  Patient states that he leaned over to pick up a sock when he lost his balance and fell landing on his right shoulder.  He denied feeling dizzy or lightheaded and did not hit his head.  There was no loss of consciousness. Pt MOI: As per daughter, in order to get ready for HD appointment, Pt forward bent to pick up the scok from the floor and lost his balance forward. To avoid forward fall, pt hit his arm to the dresser on his L causing laceration in skin and fell to his right resulting  in hitting his head to L and injuring his shoulder to R. X-Ray found displaced fx in R proximal humor. Pt is RUE immobilized in a sling.   Clinical Impression  Matthew [name] is A and O x 4, pleasant and motivated who agreed to participate in PT evaluation. Pt's daughter who is also his POA present during the evaluation. BP monitored in [place] position 67/45, Supine 105/46, Sitting 80/70 and standing 84/47 with dizziness in standing. Pt has twitching contractions in  LUE during evaluation and and BLE by the end of the session. Pt reported of feeling sleepy and weak but remained alert. Pt MMT revealed LE weaker hen RLE, RUE remained immobilized in Sling and complied with NWB precaution. Pt needed max assist to maintain standing position and unable to take side steps. MD called during and after the session and PT discussed the findings. MD plans to order CT scan. As per MD pt will follow up with ortho consult as Out-patient. Pt  will benefit from SNF when medically stable to be discharged to return to PLOF     Follow Up Recommendations SNF    Equipment Recommendations  Cane (quad/hemiwalker)    Recommendations for Other Services       Precautions / Restrictions Precautions Precautions:  (orthostatic hypotension) Restrictions Weight Bearing Restrictions: Yes RUE Weight Bearing: Non weight bearing Other Position/Activity Restrictions: in sling immobilized      Mobility  Bed Mobility Overal bed mobility: Needs Assistance Bed Mobility: Supine to Sit;Sit to Supine     Supine to sit: Mod assist (with Mod VC for hand placment and WB precautions.) Sit to supine: Mod assist (with Mod VC for hand placement and WB precautions.)        Transfers Overall transfer level: Needs assistance Equipment used: 1 person hand held assist Transfers: Sit to/from Stand Sit to Stand: Max assist (with Mod VC for hand placement and WB precautions and postural awareness.)            Ambulation/Gait Ambulation/Gait assistance: Total assist              Stairs            Wheelchair Mobility    Modified Rankin (Stroke Patients Only)       Balance Overall balance assessment: History of Falls;Needs assistance Sitting-balance support: Single extremity supported;Feet supported Sitting balance-Leahy Scale: Fair     Standing balance support: No upper extremity supported Standing balance-Leahy Scale: Zero Standing balance comment: forward lean  with dizziness                             Pertinent Vitals/Pain Pain Assessment: 0-10 Pain Score: 5  Pain Location: R arm Pain Descriptors / Indicators: Constant;Aching Pain Intervention(s): Monitored during session (RN notified)    Home Living Family/patient expects to be discharged to:: Private residence Living Arrangements: Children (daughter) Available Help at Discharge: Family Type of Home: House Home Access: Stairs to enter Entrance  Stairs-Rails: Can reach both Entrance Stairs-Number of Steps: [place]: One level [place]: [place] - single point      Prior Function Level of Independence: Independent               Hand Dominance   Dominant Hand: Right    Extremity/Trunk Assessment   Upper Extremity Assessment Upper Extremity Assessment: RUE deficits/detail (RUE immobilized, LUE twitching noted) RUE Deficits / Details: Immobilized RUE: Unable to fully assess due to immobilization    Lower Extremity Assessment Lower Extremity Assessment: Generalized weakness;LLE deficits/detail LLE Deficits / Details: LLE weaker then RLE LLE:  (3/5 in Knee and hip and 3- in Ankle) LLE Sensation: WNL LLE Coordination: WNL       Communication   Communication: No difficulties  Cognition Arousal/Alertness: Awake/alert Behavior During Therapy:  (sleepy) Overall Cognitive Status: Within Functional Limits for tasks assessed                                        General Comments      Exercises General Exercises - Lower Extremity Ankle Circles/Pumps: AROM;10 reps Heel Slides: AROM;10 reps   Assessment/Plan    PT Assessment Patient needs continued PT services  PT Problem List Decreased strength;Decreased range of motion;Decreased activity tolerance;Decreased balance;Decreased mobility;Decreased knowledge of use of DME;Decreased safety awareness;Decreased knowledge of precautions;Pain       PT Treatment Interventions Gait training;Stair training;Functional mobility training;Therapeutic activities;Therapeutic exercise;Balance training;Neuromuscular re-education;Patient/family education    PT Goals (Current goals can be found in the Care Plan section)  Acute Rehab PT Goals Patient Stated Goal: " i want to get better and go home." PT Goal Formulation: With patient/family Time For Goal Achievement: 01/09/21 Potential to Achieve Goals: Fair    Frequency 7X/week   Barriers to discharge         Co-evaluation               AM-PAC PT "6 Clicks" Mobility  Outcome Measure Help needed turning from your back to your side while in a flat bed without using bedrails?: A Little Help needed moving from lying on your back to sitting on the side of a flat bed without using bedrails?: A Lot Help needed moving to and from a bed to a chair (including a wheelchair)?: Total Help needed standing up from a chair using your arms (e.g., wheelchair or bedside chair)?: A Lot Help needed to walk in hospital room?: Total Help needed climbing 3-5 steps with a railing? : Total 6 Click Score: 10    End of Session Equipment Utilized During Treatment: Gait belt Activity Tolerance: Patient limited by pain;Treatment limited secondary to medical complications (Comment) Patient left: in bed;with call bell/phone within reach;with bed alarm set;with family/visitor present Nurse Communication: Mobility status;Weight bearing status;Precautions PT Visit Diagnosis: Unsteadiness on feet (R26.81);Repeated falls (R29.6);Muscle weakness (generalized) (M62.81);History of falling (Z91.81);Difficulty in walking, not elsewhere classified (  R26.2)    Time: 6283-1517 PT Time Calculation (min) (ACUTE ONLY): 32 min   Charges:   PT Evaluation $PT Eval High Complexity: 1 High PT Treatments $Therapeutic Exercise: 8-22 mins $Therapeutic Activity: 8-22 mins       Joaquin Music PT DPT 2:44 PM,12/26/20

## 2020-12-26 NOTE — Progress Notes (Signed)
[facility]  ROUNDING NOTE   Subjective:   [name] was admitted to [facility] on 12/25/2020 for Fall [W19.XXXA] Closed fracture of proximal end of right humerus, unspecified fracture morphology, initial encounter [S42.201A] Hypotension [I95.9]  Patient seen resting in bed Wife at bedside Hemodialysis yesterday, tolerated well Awaiting ortho consult   Objective:  Vital signs in last 24 hours:  Temp:  [98.1 F (36.7 C)-98.7 F (37.1 C)] 98.7 F (37.1 C) (08/05 0753) Pulse Rate:  [68-73] 71 (08/05 0753) Resp:  [12-18] 16 (08/05 0457) BP: (83-119)/(41-58) 88/46 (08/05 0753) SpO2:  [94 %-100 %] 94 % (08/05 0753)  Weight change:  Filed Weights   12/25/20 0627  Weight: 97.5 kg    Intake/Output: I/O last 3 completed shifts: In: -  Out: 1400 [Urine:400; Other:1000]   Intake/Output this shift:  Total I/O In: 180 [P.O.:180] Out: 100 [Urine:100]  Physical Exam: General: NAD, laying in bed  Head: Normocephalic, atraumatic. Moist oral mucosal membranes  Eyes: Anicteric  Lungs:  Clear to auscultation,normal effort  Heart: Regular rate and rhythm  Abdomen:  Soft, nontender  Extremities:  no peripheral edema.  Neurologic: Nonfocal, moving all four extremities  Skin: No lesions  Access: RIJ permcath    Basic Metabolic Panel: Recent Labs  Lab 12/25/20 0831 12/26/20 0501  NA 135 134*  K 5.2* 4.6  CL 100 99  CO2 28 29  GLUCOSE 129* 100*  BUN 53* 37*  CREATININE 3.45* 3.49*  CALCIUM 9.3 8.6*     Liver Function Tests: Recent Labs  Lab 12/25/20 0831  AST 35  ALT 34  ALKPHOS 94  BILITOT 0.9  PROT 6.6  ALBUMIN 3.4*    No results for input(s): LIPASE, AMYLASE in the last 168 hours. No results for input(s): AMMONIA in the last 168 hours.  CBC: Recent Labs  Lab 12/25/20 0831  WBC 7.8  NEUTROABS 5.6  HGB 11.5*  HCT 34.0*  MCV 99.1  PLT 136*     Cardiac Enzymes: No results for input(s): CKTOTAL, CKMB, CKMBINDEX, TROPONINI in the  last 168 hours.  BNP: Invalid input(s): POCBNP  CBG: Recent Labs  Lab 12/25/20 0819 12/25/20 1741 12/25/20 2035 12/26/20 0751 12/26/20 1133  GLUCAP 115* 153* 214* 104* 139*     Microbiology: Results for orders placed or performed during the hospital encounter of 12/25/20  Resp Panel by RT-PCR (Flu A&B, Covid) Nasopharyngeal Swab     Status: None   Collection Time: 12/25/20  8:46 AM   Specimen: Nasopharyngeal Swab; Nasopharyngeal(NP) swabs in vial transport medium  Result Value Ref Range Status   SARS Coronavirus 2 by RT PCR NEGATIVE NEGATIVE Final    Comment: (NOTE) SARS-CoV-2 target nucleic acids are NOT DETECTED.  The SARS-CoV-2 RNA is generally detectable in upper respiratory specimens during the acute phase of infection. The lowest concentration of SARS-CoV-2 viral copies this assay can detect is 138 copies/mL. A negative result does not preclude SARS-Cov-2 infection and should not be used as the sole basis for treatment or other patient management decisions. A negative result may occur with  improper specimen collection/handling, submission of specimen other than nasopharyngeal swab, presence of viral mutation(s) within the areas targeted by this assay, and inadequate number of viral copies(<138 copies/mL). A negative result must be combined with clinical observations, patient history, and epidemiological information. The expected result is Negative.  Fact Sheet for Patients:  EntrepreneurPulse.com.au  Fact Sheet for Healthcare Providers:  IncredibleEmployment.be  This test is no t yet approved or  cleared by the [place] and  has been authorized for detection and/or diagnosis of SARS-CoV-2 by FDA under an Emergency Use Authorization (EUA). This EUA will remain  in effect (meaning this test can be used) for the duration of the COVID-19 declaration under Section 564(b)(1) of the Act, 21 U.S.C.section 360bbb-3(b)(1),  unless the authorization is terminated  or revoked sooner.       Influenza A by PCR NEGATIVE NEGATIVE Final   Influenza B by PCR NEGATIVE NEGATIVE Final    Comment: (NOTE) The Xpert Xpress SARS-CoV-2/FLU/RSV plus assay is intended as an aid in the diagnosis of influenza from Nasopharyngeal swab specimens and should not be used as a sole basis for treatment. Nasal washings and aspirates are unacceptable for Xpert Xpress SARS-CoV-2/FLU/RSV testing.  Fact Sheet for Patients: EntrepreneurPulse.com.au  Fact Sheet for Healthcare Providers: IncredibleEmployment.be  This test is not yet approved or cleared by the [place] FDA and has been authorized for detection and/or diagnosis of SARS-CoV-2 by FDA under an Emergency Use Authorization (EUA). This EUA will remain in effect (meaning this test can be used) for the duration of the COVID-19 declaration under Section 564(b)(1) of the Act, 21 U.S.C. section 360bbb-3(b)(1), unless the authorization is terminated or revoked.  Performed at [facility], [address] [place], [place] [address]     Coagulation Studies: No results for input(s): LABPROT, INR in the last 72 hours.  Urinalysis: No results for input(s): COLORURINE, LABSPEC, PHURINE, GLUCOSEU, HGBUR, BILIRUBINUR, KETONESUR, PROTEINUR, UROBILINOGEN, NITRITE, LEUKOCYTESUR in the last 72 hours.  Invalid input(s): APPERANCEUR    Imaging: DG Shoulder Right  Result Date: 12/25/2020 CLINICAL DATA:  Fall.  Shoulder pain EXAM: RIGHT SHOULDER - 2+ VIEW COMPARISON:  None. FINDINGS: Fracture proximal right humerus involving the humeral neck and greater tuberosity. Humeral head normally located. Degenerative change of the greater tuberosity and AC joint. Right jugular central venous catheter noted. IMPRESSION: Comminuted fracture proximal right humerus. Electronically Signed   By: Franchot Gallo M.D.   On: 12/25/2020 08:02   DG Humerus  Right  Result Date: 12/25/2020 CLINICAL DATA:  Fall EXAM: RIGHT HUMERUS - 2+ VIEW COMPARISON:  Right shoulder 12/25/2020 FINDINGS: Fracture right humeral neck. Comminuted fracture with mild displacement. Humeral head normally located. Degenerative change in the shoulder and AC joint. IMPRESSION: Comminuted and displaced fracture proximal right humerus involving the humeral neck and greater tuberosity. Electronically Signed   By: Franchot Gallo M.D.   On: 12/25/2020 08:01     Medications:    sodium chloride      amiodarone  200 mg Oral Daily   Chlorhexidine Gluconate Cloth  6 each Topical Q0600   cholecalciferol  1,000 Units Oral Q breakfast   clopidogrel  75 mg Oral Q breakfast   ezetimibe  10 mg Oral QAC breakfast   ferrous sulfate  325 mg Oral BID WC   heparin  5,000 Units Subcutaneous Q8H   insulin aspart  0-6 Units Subcutaneous TID WC   levothyroxine  75 mcg Oral QAC breakfast   loratadine  10 mg Oral q morning   metoprolol succinate  12.5 mg Oral QHS   midodrine  10 mg Oral TID with meals   multivitamin  1 tablet Oral Daily   omega-3 acid ethyl esters  4,000 mg Oral Q lunch   rosuvastatin  40 mg Oral QHS   sodium chloride flush  3 mL Intravenous Q12H   sodium chloride, fluticasone, HYDROcodone-acetaminophen, morphine injection, nitroGLYCERIN, ondansetron **OR** ondansetron (ZOFRAN) IV, sodium  chloride flush  Assessment/ Plan:  [name] is a 87 y.o. white male with end stage renal disease on hemodialysis, hypertension, atrial fibrillation, coronary artery disease status post CABG, diabetes mellitus type II, hypothyroidism, who is admitted to [facility] on 12/25/2020 for Fall [W19.XXXA] Closed fracture of proximal end of right humerus, unspecified fracture morphology, initial encounter [S42.201A] Hypotension [I95.9] Found to have a right humerus fracture.   [facility] TTS [address] RIJ permcath 98.5kg  End Stage Renal Disease: TTS, Received dialysis yesterday, tolerated  well. UF 1L achieved.l Next treatment scheduled for Saturday.   Hypertension: 88/46. Home regimen of midodrine before dialysis and for hypertension he takes metoprolol and furosemide. Furosemide used to preserve patient's renal function and may be transitioned to non-dialysis days. Will d/c oral potassium supplement.   Anemia of chronic kidney disease: hemoglobin at goal 11.5  Secondary Hyperparathyroidism: outpatient PTH on 7/12 was low at 134. Not currently on binders. Hold vitamin D agents.     LOS: 0   8/5/202212:35 PM

## 2020-12-26 NOTE — Progress Notes (Signed)
PT Cancellation Note  Patient Details Name: Matthew [name] MRN: [id number] DOB: 26-Jun-1930   Cancelled Treatment:    Reason Eval/Treat Not Completed: Patient not medically ready. PT orders received and pt chart reviewed. Per attending MD note, pt pending ortho consult for R humeral fx. Will follow up with therapy intervention s/p ortho consult as appropriate.  Herminio Commons, PT, DPT 8:20 AM,12/26/20

## 2020-12-26 NOTE — NC FL2 (Addendum)
[place] LEVEL OF CARE SCREENING TOOL     IDENTIFICATION  Patient Name: Matthew [name] Birthdate: 07-Jan-1931 Sex: male Admission Date (Current Location): 12/25/2020  [facility] and [place] Number:  Engineering geologist and Address:  [facility], [address] [place], [place] [address]      Provider Number: [id number]  Attending Physician Name and Address:  Loletha Grayer, MD  Relative Name and Phone Number:       Current Level of Care: Hospital Recommended Level of Care: [place] Prior Approval Number:    Date Approved/Denied:   PASRR Number: [id number]  Discharge Plan: SNF    Current Diagnoses: Patient Active Problem List   Diagnosis Date Noted   Hypotension 12/26/2020   Tremor    Hypothyroidism    Chronic systolic CHF (congestive heart failure) ([place])    Fall 12/25/2020   Proximal humerus fracture 12/25/2020   Malposition of peritoneal dialysis catheter ([place]) 11/01/2020   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   Sepsis ([place]) 02/15/2018   Fluid overload 01/10/2018   Hypoglycemia 12/09/2017   Chronic kidney disease 07/18/2015   GERD (gastroesophageal reflux disease) 07/18/2015   CAD (coronary artery disease) 12/04/2013   Unstable angina; Class III Angina 12/03/2013   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 ([place]) 12/03/2013   HLD (hyperlipidemia) 12/03/2013   Heart disease 12/03/2013    Orientation RESPIRATION BLADDER Height & Weight     Self, Time, Situation, Place  Normal Continent Weight: 215 lb (97.5 kg) Height:  6' (182.9 cm)  BEHAVIORAL SYMPTOMS/MOOD NEUROLOGICAL BOWEL NUTRITION STATUS   (None)  (None) Continent Diet (Carb modified.)  AMBULATORY STATUS COMMUNICATION OF NEEDS Skin   Total Care Verbally Normal                       Personal Care Assistance Level of Assistance  Bathing, Feeding, Dressing Bathing Assistance: Maximum assistance Feeding assistance: Limited  assistance Dressing Assistance: Maximum assistance     Functional Limitations Info  Sight, Hearing, Speech Sight Info: Adequate Hearing Info: Adequate Speech Info: Adequate    SPECIAL CARE FACTORS FREQUENCY  PT (By licensed PT), OT (By licensed OT)     PT Frequency: 5 x week OT Frequency: 5 x week            Contractures Contractures Info: Not present    Additional Factors Info  Code Status, Allergies Code Status Info: DNR Allergies Info: Myalgias, Statin drugs, Amoxicillin           Current Medications (12/26/2020):  This is the current hospital active medication list Current Facility-Administered Medications  Medication Dose Route Frequency Provider Last Rate Last Admin   0.9 %  sodium chloride infusion  250 mL Intravenous PRN Agbata, Tochukwu, MD       amiodarone (PACERONE) tablet 200 mg  200 mg Oral Daily Agbata, Tochukwu, MD   200 mg at 12/26/20 1696   Chlorhexidine Gluconate Cloth 2 % PADS 6 each  6 each Topical Q0600 Kolluru, Lurena Nida, MD   6 each at 12/26/20 0800   cholecalciferol (VITAMIN D) tablet 1,000 Units  1,000 Units Oral Q breakfast Agbata, Tochukwu, MD   1,000 Units at 12/26/20 7893   clopidogrel (PLAVIX) tablet 75 mg  75 mg Oral Q breakfast Agbata, Tochukwu, MD   75 mg at 12/26/20 8101   ezetimibe (ZETIA) tablet 10 mg  10 mg Oral QAC breakfast Agbata, Tochukwu, MD   10 mg at  12/26/20 0905   ferrous sulfate tablet 325 mg  325 mg Oral BID WC Agbata, Tochukwu, MD   325 mg at 12/26/20 0904   fluticasone (FLONASE) 50 MCG/ACT nasal spray 2 spray  2 spray Each Nare Daily PRN Agbata, Tochukwu, MD       heparin injection 5,000 Units  5,000 Units Subcutaneous Q8H Agbata, Tochukwu, MD   5,000 Units at 12/26/20 1359   HYDROcodone-acetaminophen (NORCO/VICODIN) 5-325 MG per tablet 1 tablet  1 tablet Oral Q4H PRN Loletha Grayer, MD       insulin aspart (novoLOG) injection 0-6 Units  0-6 Units Subcutaneous TID WC Agbata, Tochukwu, MD       levothyroxine (SYNTHROID)  tablet 75 mcg  75 mcg Oral QAC breakfast Agbata, Tochukwu, MD   75 mcg at 12/26/20 0653   loratadine (CLARITIN) tablet 10 mg  10 mg Oral q morning Agbata, Tochukwu, MD   10 mg at 12/26/20 8588   metoprolol succinate (TOPROL-XL) 24 hr tablet 12.5 mg  12.5 mg Oral QHS Wieting, Richard, MD       midodrine (PROAMATINE) tablet 10 mg  10 mg Oral TID with meals Loletha Grayer, MD   10 mg at 12/26/20 1359   morphine 2 MG/ML injection 1 mg  1 mg Intravenous Q2H PRN Loletha Grayer, MD       multivitamin (RENA-VIT) tablet 1 tablet  1 tablet Oral Daily Agbata, Tochukwu, MD   1 tablet at 12/26/20 0904   nitroGLYCERIN (NITROSTAT) SL tablet 0.4 mg  0.4 mg Sublingual Q5 min PRN Agbata, Tochukwu, MD       omega-3 acid ethyl esters (LOVAZA) capsule 4,000 mg  4,000 mg Oral Q lunch Agbata, Tochukwu, MD   4,000 mg at 12/26/20 1359   ondansetron (ZOFRAN) tablet 4 mg  4 mg Oral Q6H PRN Agbata, Tochukwu, MD       Or   ondansetron (ZOFRAN) injection 4 mg  4 mg Intravenous Q6H PRN Agbata, Tochukwu, MD       rosuvastatin (CRESTOR) tablet 40 mg  40 mg Oral QHS Agbata, Tochukwu, MD   40 mg at 12/25/20 2130   sodium chloride flush (NS) 0.9 % injection 3 mL  3 mL Intravenous Q12H Agbata, Tochukwu, MD   3 mL at 12/26/20 0906   sodium chloride flush (NS) 0.9 % injection 3 mL  3 mL Intravenous PRN Agbata, Tochukwu, MD         Discharge Medications: Please see discharge summary for a list of discharge medications.  Relevant Imaging Results:  Relevant Lab Results:   Additional Information SS#: [id number]. HD TTS [employer] 6:30. Per daughter fully vaccinated (05/30/2019, 06/20/2019) with two boosters (unknown dates). Inpatient on 8/5.  Candie Chroman, LCSW

## 2020-12-26 NOTE — Progress Notes (Addendum)
Patient ID: Matthew [name], male   DOB: 11-25-1930, 87 y.o.   MRN: [id number] [facility] PROGRESS NOTE  REESE STOCKMAN YWV:[id number] DOB: [birth year] DOA: 12/25/2020 PCP: Jodi Marble, MD  HPI/Subjective: Patient dropped his socks and when he bent over to try to pick it up he fell over and found to have a fracture of the right humerus.  Patient currently has right arm immobilized.  Patient feels weak.  Patient's blood pressure on the lower side.  Held Lasix today.  Left arm had a brief tremor.   Objective: Vitals:   12/26/20 0457 12/26/20 0753  BP: (!) 84/41 (!) 88/46  Pulse: 70 71  Resp: 16   Temp: 98.2 F (36.8 C) 98.7 F (37.1 C)  SpO2: 95% 94%    Intake/Output Summary (Last 24 hours) at 12/26/2020 1324 Last data filed at 12/26/2020 1123 Gross per 24 hour  Intake 180 ml  Output 1500 ml  Net -1320 ml   Filed Weights   12/25/20 0627  Weight: 97.5 kg    ROS: Review of Systems  Respiratory:  Negative for shortness of breath.   Cardiovascular:  Negative for chest pain.  Gastrointestinal:  Negative for abdominal pain, nausea and vomiting.  Neurological:  Positive for tremors.  Exam: Physical Exam HENT:     Head: Normocephalic.     Mouth/Throat:     Pharynx: No oropharyngeal exudate.  Eyes:     General: Lids are normal.     Conjunctiva/sclera: Conjunctivae normal.     Pupils: Pupils are equal, round, and reactive to light.  Cardiovascular:     Rate and Rhythm: Normal rate and regular rhythm.     Heart sounds: Normal heart sounds, S1 normal and S2 normal.  Pulmonary:     Breath sounds: No decreased breath sounds, wheezing, rhonchi or rales.  Abdominal:     Palpations: Abdomen is soft.     Tenderness: There is no abdominal tenderness.  Musculoskeletal:     Right lower leg: Swelling present.     Left lower leg: Swelling present.  Skin:    General: Skin is warm.     Findings: No rash.  Neurological:     Mental Status: He is alert and oriented to person,  place, and time.     Data Reviewed: Basic Metabolic Panel: Recent Labs  Lab 12/25/20 0831 12/26/20 0501  NA 135 134*  K 5.2* 4.6  CL 100 99  CO2 28 29  GLUCOSE 129* 100*  BUN 53* 37*  CREATININE 3.45* 3.49*  CALCIUM 9.3 8.6*   Liver Function Tests: Recent Labs  Lab 12/25/20 0831  AST 35  ALT 34  ALKPHOS 94  BILITOT 0.9  PROT 6.6  ALBUMIN 3.4*   CBC: Recent Labs  Lab 12/25/20 0831  WBC 7.8  NEUTROABS 5.6  HGB 11.5*  HCT 34.0*  MCV 99.1  PLT 136*    CBG: Recent Labs  Lab 12/25/20 0819 12/25/20 1741 12/25/20 2035 12/26/20 0751 12/26/20 1133  GLUCAP 115* 153* 214* 104* 139*    Recent Results (from the past 240 hour(s))  Resp Panel by RT-PCR (Flu A&B, Covid) Nasopharyngeal Swab     Status: None   Collection Time: 12/25/20  8:46 AM   Specimen: Nasopharyngeal Swab; Nasopharyngeal(NP) swabs in vial transport medium  Result Value Ref Range Status   SARS Coronavirus 2 by RT PCR NEGATIVE NEGATIVE Final    Comment: (NOTE) SARS-CoV-2 target nucleic acids are NOT DETECTED.  The SARS-CoV-2 RNA is generally detectable  in upper respiratory specimens during the acute phase of infection. The lowest concentration of SARS-CoV-2 viral copies this assay can detect is 138 copies/mL. A negative result does not preclude SARS-Cov-2 infection and should not be used as the sole basis for treatment or other patient management decisions. A negative result may occur with  improper specimen collection/handling, submission of specimen other than nasopharyngeal swab, presence of viral mutation(s) within the areas targeted by this assay, and inadequate number of viral copies(<138 copies/mL). A negative result must be combined with clinical observations, patient history, and epidemiological information. The expected result is Negative.  Fact Sheet for Patients:  EntrepreneurPulse.com.au  Fact Sheet for Healthcare Providers:   IncredibleEmployment.be  This test is no t yet approved or cleared by the [place] FDA and  has been authorized for detection and/or diagnosis of SARS-CoV-2 by FDA under an Emergency Use Authorization (EUA). This EUA will remain  in effect (meaning this test can be used) for the duration of the COVID-19 declaration under Section 564(b)(1) of the Act, 21 U.S.C.section 360bbb-3(b)(1), unless the authorization is terminated  or revoked sooner.       Influenza A by PCR NEGATIVE NEGATIVE Final   Influenza B by PCR NEGATIVE NEGATIVE Final    Comment: (NOTE) The Xpert Xpress SARS-CoV-2/FLU/RSV plus assay is intended as an aid in the diagnosis of influenza from Nasopharyngeal swab specimens and should not be used as a sole basis for treatment. Nasal washings and aspirates are unacceptable for Xpert Xpress SARS-CoV-2/FLU/RSV testing.  Fact Sheet for Patients: EntrepreneurPulse.com.au  Fact Sheet for Healthcare Providers: IncredibleEmployment.be  This test is not yet approved or cleared by the [place] FDA and has been authorized for detection and/or diagnosis of SARS-CoV-2 by FDA under an Emergency Use Authorization (EUA). This EUA will remain in effect (meaning this test can be used) for the duration of the COVID-19 declaration under Section 564(b)(1) of the Act, 21 U.S.C. section 360bbb-3(b)(1), unless the authorization is terminated or revoked.  Performed at [facility], [address] [place], [place] [address]      Studies: DG Shoulder Right  Result Date: 12/25/2020 CLINICAL DATA:  Fall.  Shoulder pain EXAM: RIGHT SHOULDER - 2+ VIEW COMPARISON:  None. FINDINGS: Fracture proximal right humerus involving the humeral neck and greater tuberosity. Humeral head normally located. Degenerative change of the greater tuberosity and AC joint. Right jugular central venous catheter noted. IMPRESSION: Comminuted  fracture proximal right humerus. Electronically Signed   By: Franchot Gallo M.D.   On: 12/25/2020 08:02   DG Humerus Right  Result Date: 12/25/2020 CLINICAL DATA:  Fall EXAM: RIGHT HUMERUS - 2+ VIEW COMPARISON:  Right shoulder 12/25/2020 FINDINGS: Fracture right humeral neck. Comminuted fracture with mild displacement. Humeral head normally located. Degenerative change in the shoulder and AC joint. IMPRESSION: Comminuted and displaced fracture proximal right humerus involving the humeral neck and greater tuberosity. Electronically Signed   By: Franchot Gallo M.D.   On: 12/25/2020 08:01    Scheduled Meds:  amiodarone  200 mg Oral Daily   Chlorhexidine Gluconate Cloth  6 each Topical Q0600   cholecalciferol  1,000 Units Oral Q breakfast   clopidogrel  75 mg Oral Q breakfast   ezetimibe  10 mg Oral QAC breakfast   ferrous sulfate  325 mg Oral BID WC   heparin  5,000 Units Subcutaneous Q8H   insulin aspart  0-6 Units Subcutaneous TID WC   levothyroxine  75 mcg Oral QAC breakfast   loratadine  10  mg Oral q morning   metoprolol succinate  12.5 mg Oral QHS   midodrine  10 mg Oral TID with meals   multivitamin  1 tablet Oral Daily   omega-3 acid ethyl esters  4,000 mg Oral Q lunch   rosuvastatin  40 mg Oral QHS   sodium chloride flush  3 mL Intravenous Q12H   Continuous Infusions:  sodium chloride     Brief history.  Patient admitted after a fall and found to have a right humerus fracture.  He leaned over to pick up a sock and lost his balance and fell over.  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  Assessment/Plan:  Hypotension.  Hold Lasix.  Increase midodrine to 10 mg 3 times a day.  Patient's daughter states that his heart rate goes fast if we do not give the metoprolol.  We will change that to evening doses and cut the dose down to 12.5 mg  nightly.  Physical therapy trying to do orthostatic vital signs.  Blood pressure variable. Brief left arm tremor.  We will get a CT scan of the head and the cervical spine to rule out any intracranial or cervical abnormality. Right humerus fracture.  Case discussed with orthopedic surgery and they recommended follow-up in 7 to 10 days as outpatient.  Continue right arm immobilizer.  As needed pain control. Type 2 diabetes mellitus with end-stage renal disease had hemodialysis yesterday.  Holding glipizide.  On sliding scale insulin. History of CAD on low-dose metoprolol, Plavix, Crestor and Zetia. Hypothyroidism unspecified on levothyroxine Arrhythmia history on metoprolol and amiodarone. Chronic systolic congestive heart failure.  Holding Lasix at this point dialysis to manage fluid.  Limited with meds secondary to hypotension.     Code Status:     Code Status Orders  (From admission, onward)           Start     Ordered   12/25/20 1343  Do not attempt resuscitation (DNR)  Continuous       Question Answer Comment  In the event of cardiac or respiratory ARREST Do not call a "code blue"   In the event of cardiac or respiratory ARREST Do not perform Intubation, CPR, defibrillation or ACLS   In the event of cardiac or respiratory ARREST Use medication by any route, position, wound care, and other measures to relive pain and suffering. May use oxygen, suction and manual treatment of airway obstruction as needed for comfort.   Comments Code status was discussed with patient and he is a DNR      12/25/20 1342           Code Status History     Date Active Date Inactive Code Status Order ID Comments User Context   11/01/2020 1643 11/05/2020 2001 DNR [id number]  Kayleen Memos, DO ED   11/01/2020 1554 11/01/2020 1643 Full Code [id number]  Kayleen Memos, DO ED   11/01/2020 1553 11/01/2020 1554 Full Code [id number]  Kayleen Memos, DO ED   05/24/2019 0431 05/26/2019 1925 Full Code [id number]   Mansy, Arvella Merles, MD ED   02/15/2018 1910 02/17/2018 1529 DNR [id number]  Saundra Shelling, MD Inpatient   01/19/2018 0104 01/20/2018 1812 DNR [id number]  Amelia Jo, MD Inpatient   01/10/2018 1248 01/15/2018 1546 DNR [id number]  Loletha Grayer, MD Inpatient   01/10/2018 1224 01/10/2018 1248 Full Code [id number]  Loletha Grayer, MD Inpatient  12/09/2017 1835 12/11/2017 1508 Full Code [id number]  Loletha Grayer, MD Inpatient   12/06/2013 1125 12/07/2013 1536 Full Code [id number]  Belva Crome, MD Inpatient   12/04/2013 0955 12/06/2013 1125 Full Code [id number]  Leonie Man, MD Inpatient   12/03/2013 0407 12/04/2013 0955 Full Code [id number]  Rise Patience, MD Inpatient      Advance Directive Documentation    Flowsheet Row Most Recent Value  Type of Advance Directive Healthcare Power of Attorney, Living will  Pre-existing out of facility DNR order (yellow form or pink MOST form) --  "MOST" Form in Place? --      Family Communication: Spoke with daughter at the bedside this morning and on the phone this afternoon Disposition Plan: Status is: Inpatient  Dispo: The patient is from: Home              Anticipated d/c is to: Home versus rehab depending on how he does with PT.              Patient currently being treated with midodrine for hypotension   Difficult to place patient.  No.  Consultants: Case discussed with orthopedic surgery and recommended outpatient follow-up. Nephrology  Time spent: 27 minutes, case discussed with nephrology.  [place]  [facility] [employer]

## 2020-12-26 NOTE — Progress Notes (Signed)
OT Cancellation Note  Patient Details Name: Matthew [name] MRN: [id number] DOB: [birth year]   Cancelled Treatment:    Reason Eval/Treat Not Completed: Patient at procedure or test/ unavailable. Pt very orthostatic during PT session and currently going down for CT scan. OT will re-attempt when pt is next available and when safe to proceed after results of CT.  Darleen Crocker, MS, OTR/L , CBIS ascom [phone]  12/26/20, 2:21 PM

## 2020-12-27 LAB — HEPATITIS B SURFACE ANTIGEN: Hepatitis B Surface Ag: NONREACTIVE

## 2020-12-27 LAB — GLUCOSE, CAPILLARY
Glucose-Capillary: 125 mg/dL — ABNORMAL HIGH (ref 70–99)
Glucose-Capillary: 127 mg/dL — ABNORMAL HIGH (ref 70–99)
Glucose-Capillary: 139 mg/dL — ABNORMAL HIGH (ref 70–99)
Glucose-Capillary: 166 mg/dL — ABNORMAL HIGH (ref 70–99)
Glucose-Capillary: 216 mg/dL — ABNORMAL HIGH (ref 70–99)

## 2020-12-27 NOTE — Progress Notes (Signed)
Physical Therapy Treatment Patient Details Name: Matthew [name] MRN: [id number] DOB: 10/23/1930 Today's Date: 12/27/2020    History of Present Illness Matthew [name] is a 87 y.o. male with medical history significant for diabetes mellitus with complications of end-stage renal disease on hemodialysis (dialysis days are Tuesday/Thursday/Saturday), coronary artery disease status post CABG, hypertension who presents to the ER for evaluation after he fell this morning while getting ready to go to dialysis.  Patient states that he leaned over to pick up a sock when he lost his balance and fell landing on his right shoulder.  He denied feeling dizzy or lightheaded and did not hit his head.  There was no loss of consciousness.    PT Comments    Pt ready for session before dialysis.  Orthostatics taken Supine 117/52 P 77 Sitting 103/89 P 81 Stand 92/52 P 88 - he is unable to safely stand for reading so BP is taken in sitting after short stand.  Pt denies dizziness today.  He is unsafe to attempt standing with +1 assist in further attempts.  Heavy lean to left and daughter assisted with blocking foot to prevent sliding.  He is returned to supine as breakfast had arrived and dialysis transport waiting.   Follow Up Recommendations  SNF     Equipment Recommendations  Cane (quad/hemiwalker)    Recommendations for Other Services       Precautions / Restrictions Precautions Precautions: Fall Precaution Comments: orthostatic Required Braces or Orthoses: Sling Restrictions Weight Bearing Restrictions: Yes RUE Weight Bearing: Non weight bearing Other Position/Activity Restrictions: in sling immobilized    Mobility  Bed Mobility Overal bed mobility: Needs Assistance Bed Mobility: Supine to Sit;Sit to Supine     Supine to sit: Min assist Sit to supine: Min assist        Transfers Overall transfer level: Needs assistance Equipment used: 2 person hand held assist Transfers: Sit to/from  Stand Sit to Stand: Mod assist;Max assist;+2 physical assistance         General transfer comment: unsafe to attempt with +1.  Should be +2 for any OOB mobility attempts at this time  Ambulation/Gait                 Stairs             Wheelchair Mobility    Modified Rankin (Stroke Patients Only)       Balance Overall balance assessment: History of Falls;Needs assistance Sitting-balance support: Single extremity supported;Feet supported Sitting balance-Leahy Scale: Fair     Standing balance support: No upper extremity supported Standing balance-Leahy Scale: Zero Standing balance comment: Left lean, denies dizziness today                            Cognition Arousal/Alertness: Awake/alert Behavior During Therapy: WFL for tasks assessed/performed Overall Cognitive Status: Within Functional Limits for tasks assessed                                        Exercises      General Comments        Pertinent Vitals/Pain Pain Assessment: No/denies pain    Home Living                      Prior Function  PT Goals (current goals can now be found in the care plan section) Progress towards PT goals: Progressing toward goals    Frequency    7X/week      PT Plan      Co-evaluation              AM-PAC PT "6 Clicks" Mobility   Outcome Measure  Help needed turning from your back to your side while in a flat bed without using bedrails?: A Little Help needed moving from lying on your back to sitting on the side of a flat bed without using bedrails?: A Lot Help needed moving to and from a bed to a chair (including a wheelchair)?: Total Help needed standing up from a chair using your arms (e.g., wheelchair or bedside chair)?: A Lot Help needed to walk in hospital room?: Total Help needed climbing 3-5 steps with a railing? : Total 6 Click Score: 10    End of Session Equipment Utilized During  Treatment: Gait belt Activity Tolerance: Patient limited by pain;Treatment limited secondary to medical complications (Comment) Patient left: in bed;with call bell/phone within reach;with bed alarm set;with family/visitor present Nurse Communication: Mobility status;Weight bearing status;Precautions PT Visit Diagnosis: Unsteadiness on feet (R26.81);Repeated falls (R29.6);Muscle weakness (generalized) (M62.81);History of falling (Z91.81);Difficulty in walking, not elsewhere classified (R26.2)     Time: 9179-1505 PT Time Calculation (min) (ACUTE ONLY): 15 min  Charges:  $Therapeutic Activity: 8-22 mins                    [name], PTA 12/27/20, 8:55 AM , 8:53 AM

## 2020-12-27 NOTE — Progress Notes (Addendum)
[facility]  ROUNDING NOTE   Subjective:   Matthew [name] was admitted to [facility] on 12/25/2020 for Fall [W19.XXXA] Closed fracture of proximal end of right humerus, unspecified fracture morphology, initial encounter [S42.201A] Hypotension [I95.9]  Patient seen resting in bed Daughter at bedside Tolerating meals  Denies nausea and vomiting Denies shortness of breath Patient seen later in morning with wife at bedside  Dialysis scheduled today   Objective:  Vital signs in last 24 hours:  Temp:  [98.2 F (36.8 C)-99.5 F (37.5 C)] 98.2 F (36.8 C) (08/06 0744) Pulse Rate:  [72-75] 72 (08/06 0744) Resp:  [16-20] 20 (08/06 0744) BP: (67-126)/(45-55) 126/51 (08/06 0744) SpO2:  [92 %-96 %] 92 % (08/06 0849)  Weight change:  Filed Weights   12/25/20 0627  Weight: 97.5 kg    Intake/Output: I/O last 3 completed shifts: In: 74 [P.O.:660] Out: 1000 [Urine:1000]   Intake/Output this shift:  Total I/O In: 240 [P.O.:240] Out: -   Physical Exam: General: NAD, laying in bed  Head: Normocephalic, atraumatic. Moist oral mucosal membranes  Eyes: Anicteric  Lungs:  Clear to auscultation,normal effort  Heart: Regular rate and rhythm  Abdomen:  Soft, nontender  Extremities:  no peripheral edema.  Neurologic: Nonfocal, moving all four extremities  Skin: No lesions  Access: RIJ permcath    Basic Metabolic Panel: Recent Labs  Lab 12/25/20 0831 12/26/20 0501  NA 135 134*  K 5.2* 4.6  CL 100 99  CO2 28 29  GLUCOSE 129* 100*  BUN 53* 37*  CREATININE 3.45* 3.49*  CALCIUM 9.3 8.6*     Liver Function Tests: Recent Labs  Lab 12/25/20 0831  AST 35  ALT 34  ALKPHOS 94  BILITOT 0.9  PROT 6.6  ALBUMIN 3.4*    No results for input(s): LIPASE, AMYLASE in the last 168 hours. No results for input(s): AMMONIA in the last 168 hours.  CBC: Recent Labs  Lab 12/25/20 0831  WBC 7.8  NEUTROABS 5.6  HGB 11.5*  HCT 34.0*  MCV 99.1  PLT 136*      Cardiac Enzymes: No results for input(s): CKTOTAL, CKMB, CKMBINDEX, TROPONINI in the last 168 hours.  BNP: Invalid input(s): POCBNP  CBG: Recent Labs  Lab 12/26/20 1611 12/26/20 2030 12/27/20 0745 12/27/20 0756 12/27/20 1155  GLUCAP 124* 120* 127* 125* 139*     Microbiology: Results for orders placed or performed during the hospital encounter of 12/25/20  Resp Panel by RT-PCR (Flu A&B, Covid) Nasopharyngeal Swab     Status: None   Collection Time: 12/25/20  8:46 AM   Specimen: Nasopharyngeal Swab; Nasopharyngeal(NP) swabs in vial transport medium  Result Value Ref Range Status   SARS Coronavirus 2 by RT PCR NEGATIVE NEGATIVE Final    Comment: (NOTE) SARS-CoV-2 target nucleic acids are NOT DETECTED.  The SARS-CoV-2 RNA is generally detectable in upper respiratory specimens during the acute phase of infection. The lowest concentration of SARS-CoV-2 viral copies this assay can detect is 138 copies/mL. A negative result does not preclude SARS-Cov-2 infection and should not be used as the sole basis for treatment or other patient management decisions. A negative result may occur with  improper specimen collection/handling, submission of specimen other than nasopharyngeal swab, presence of viral mutation(s) within the areas targeted by this assay, and inadequate number of viral copies(<138 copies/mL). A negative result must be combined with clinical observations, patient history, and epidemiological information. The expected result is Negative.  Fact Sheet for Patients:  EntrepreneurPulse.com.au  Fact Sheet for Healthcare Providers:  IncredibleEmployment.be  This test is no t yet approved or cleared by the [place] FDA and  has been authorized for detection and/or diagnosis of SARS-CoV-2 by FDA under an Emergency Use Authorization (EUA). This EUA will remain  in effect (meaning this test can be used) for the duration of  the COVID-19 declaration under Section 564(b)(1) of the Act, 21 U.S.C.section 360bbb-3(b)(1), unless the authorization is terminated  or revoked sooner.       Influenza A by PCR NEGATIVE NEGATIVE Final   Influenza B by PCR NEGATIVE NEGATIVE Final    Comment: (NOTE) The Xpert Xpress SARS-CoV-2/FLU/RSV plus assay is intended as an aid in the diagnosis of influenza from Nasopharyngeal swab specimens and should not be used as a sole basis for treatment. Nasal washings and aspirates are unacceptable for Xpert Xpress SARS-CoV-2/FLU/RSV testing.  Fact Sheet for Patients: EntrepreneurPulse.com.au  Fact Sheet for Healthcare Providers: IncredibleEmployment.be  This test is not yet approved or cleared by the [place] FDA and has been authorized for detection and/or diagnosis of SARS-CoV-2 by FDA under an Emergency Use Authorization (EUA). This EUA will remain in effect (meaning this test can be used) for the duration of the COVID-19 declaration under Section 564(b)(1) of the Act, 21 U.S.C. section 360bbb-3(b)(1), unless the authorization is terminated or revoked.  Performed at [facility], [place] [address] [place], [place] [address]     Coagulation Studies: No results for input(s): LABPROT, INR in the last 72 hours.  Urinalysis: No results for input(s): COLORURINE, LABSPEC, PHURINE, GLUCOSEU, HGBUR, BILIRUBINUR, KETONESUR, PROTEINUR, UROBILINOGEN, NITRITE, LEUKOCYTESUR in the last 72 hours.  Invalid input(s): APPERANCEUR    Imaging: CT HEAD WO CONTRAST (5MM)  Result Date: 12/26/2020 CLINICAL DATA:  Head trauma, minor (Age >= 65y); Neck trauma (Age >= 65y). Fall. EXAM: CT HEAD WITHOUT CONTRAST CT CERVICAL SPINE WITHOUT CONTRAST TECHNIQUE: Multidetector CT imaging of the head and cervical spine was performed following the standard protocol without intravenous contrast. Multiplanar CT image reconstructions of the cervical spine were  also generated. COMPARISON:  Head CT 02/15/2018.  Cervical spine CT 01/18/2018. FINDINGS: CT HEAD FINDINGS Brain: No acute infarct, intracranial hemorrhage, intra-axial mass, midline shift, or extra-axial fluid collection is identified. Hypodensities in the cerebral white matter are unchanged and nonspecific but compatible with mild chronic small vessel ischemic disease. A chronic lacunar infarct in the right centrum semiovale is unchanged. There is an unchanged arachnoid cyst in the right middle cranial fossa. Mild cerebral atrophy is within normal limits for age. Vascular: Calcified atherosclerosis at the skull base. No hyperdense vessel. Skull: No fracture or suspicious osseous lesion. Sinuses/Orbits: Small mucous retention cyst in the left maxillary sinus. Clear mastoid air cells. Bilateral cataract extraction. Other: None. CT CERVICAL SPINE FINDINGS Alignment: Chronic straightening of the normal cervical lordosis. Unchanged trace anterolisthesis of C7 on T1. Skull base and vertebrae: Interbody and posterior element ankylosis at C2-3 and C3-4. No acute fracture or suspicious osseous lesion. Soft tissues and spinal canal: No prevertebral fluid or swelling. No visible canal hematoma. Disc levels: Bulky anterior vertebral spurring from C4-C7 with associated disc space narrowing, moderate at C6-7. Asymmetrically severe right facet arthrosis at C7-T1. Moderate multilevel neural foraminal stenosis and suspected mild-to-moderate multilevel spinal stenosis. Upper chest: Clear lung apices. Other: Partially visualized right jugular catheter. Carotid atherosclerosis. IMPRESSION: 1. No evidence of acute intracranial abnormality. 2. Mild chronic small vessel ischemic disease. 3. Unchanged right middle cranial fossa arachnoid cyst. 4. No acute cervical spine  fracture or traumatic malalignment. Electronically Signed   By: Logan Bores M.D.   On: 12/26/2020 15:16   CT CERVICAL SPINE WO CONTRAST  Result Date:  12/26/2020 CLINICAL DATA:  Head trauma, minor (Age >= 65y); Neck trauma (Age >= 65y). Fall. EXAM: CT HEAD WITHOUT CONTRAST CT CERVICAL SPINE WITHOUT CONTRAST TECHNIQUE: Multidetector CT imaging of the head and cervical spine was performed following the standard protocol without intravenous contrast. Multiplanar CT image reconstructions of the cervical spine were also generated. COMPARISON:  Head CT 02/15/2018.  Cervical spine CT 01/18/2018. FINDINGS: CT HEAD FINDINGS Brain: No acute infarct, intracranial hemorrhage, intra-axial mass, midline shift, or extra-axial fluid collection is identified. Hypodensities in the cerebral white matter are unchanged and nonspecific but compatible with mild chronic small vessel ischemic disease. A chronic lacunar infarct in the right centrum semiovale is unchanged. There is an unchanged arachnoid cyst in the right middle cranial fossa. Mild cerebral atrophy is within normal limits for age. Vascular: Calcified atherosclerosis at the skull base. No hyperdense vessel. Skull: No fracture or suspicious osseous lesion. Sinuses/Orbits: Small mucous retention cyst in the left maxillary sinus. Clear mastoid air cells. Bilateral cataract extraction. Other: None. CT CERVICAL SPINE FINDINGS Alignment: Chronic straightening of the normal cervical lordosis. Unchanged trace anterolisthesis of C7 on T1. Skull base and vertebrae: Interbody and posterior element ankylosis at C2-3 and C3-4. No acute fracture or suspicious osseous lesion. Soft tissues and spinal canal: No prevertebral fluid or swelling. No visible canal hematoma. Disc levels: Bulky anterior vertebral spurring from C4-C7 with associated disc space narrowing, moderate at C6-7. Asymmetrically severe right facet arthrosis at C7-T1. Moderate multilevel neural foraminal stenosis and suspected mild-to-moderate multilevel spinal stenosis. Upper chest: Clear lung apices. Other: Partially visualized right jugular catheter. Carotid  atherosclerosis. IMPRESSION: 1. No evidence of acute intracranial abnormality. 2. Mild chronic small vessel ischemic disease. 3. Unchanged right middle cranial fossa arachnoid cyst. 4. No acute cervical spine fracture or traumatic malalignment. Electronically Signed   By: Logan Bores M.D.   On: 12/26/2020 15:16   MR BRAIN WO CONTRAST  Result Date: 12/26/2020 CLINICAL DATA:  Acute neurologic deficit EXAM: MRI HEAD WITHOUT CONTRAST TECHNIQUE: Multiplanar, multiecho pulse sequences of the brain and surrounding structures were obtained without intravenous contrast. COMPARISON:  None. FINDINGS: Brain: No acute infarct, mass effect or extra-axial collection. No acute or chronic hemorrhage. There is multifocal hyperintense T2-weighted signal within the white matter. Generalized volume loss without a clear lobar predilection. Right middle cranial fossa arachnoid cyst. Old left cerebellar infarct. The midline structures are normal. Vascular: Major flow voids are preserved. Skull and upper cervical spine: Normal calvarium and skull base. Visualized upper cervical spine and soft tissues are normal. Sinuses/Orbits:No paranasal sinus fluid levels or advanced mucosal thickening. No mastoid or middle ear effusion. Normal orbits. IMPRESSION: 1. No acute intracranial abnormality. 2. Findings of chronic ischemic microangiopathy and generalized volume loss. Electronically Signed   By: Ulyses Jarred M.D.   On: 12/26/2020 21:25     Medications:    sodium chloride      amiodarone  200 mg Oral Daily   Chlorhexidine Gluconate Cloth  6 each Topical Q0600   cholecalciferol  1,000 Units Oral Q breakfast   clopidogrel  75 mg Oral Q breakfast   ezetimibe  10 mg Oral QAC breakfast   ferrous sulfate  325 mg Oral BID WC   heparin  5,000 Units Subcutaneous Q8H   insulin aspart  0-6 Units Subcutaneous TID WC   levothyroxine  75  mcg Oral QAC breakfast   loratadine  10 mg Oral q morning   metoprolol succinate  12.5 mg Oral QHS    midodrine  10 mg Oral QID   multivitamin  1 tablet Oral Daily   omega-3 acid ethyl esters  4,000 mg Oral Q lunch   rosuvastatin  40 mg Oral QHS   sodium chloride flush  3 mL Intravenous Q12H   sodium chloride, fluticasone, HYDROcodone-acetaminophen, morphine injection, nitroGLYCERIN, ondansetron **OR** ondansetron (ZOFRAN) IV, sodium chloride flush  Assessment/ Plan:  Mr. GEARL BARATTA is a 87 y.o. white male with end stage renal disease on hemodialysis, hypertension, atrial fibrillation, coronary artery disease status post CABG, diabetes mellitus type II, hypothyroidism, who is admitted to [facility] on 12/25/2020 for Fall [W19.XXXA] Closed fracture of proximal end of right humerus, unspecified fracture morphology, initial encounter [S42.201A] Hypotension [I95.9] Found to have a right humerus fracture.   [facility] TTS [address] RIJ permcath 98.5kg  End Stage Renal Disease: TTS, Scheduled to receive dialysis today. Minimal UF  Hypotension:  Home regimen of midodrine before dialysis and for hypertension he takes metoprolol and furosemide. Furosemide used for volume control and may be transitioned to non-dialysis days. Midodrine increased to QID.   Anemia of chronic kidney disease: hemoglobin at goal 11.5  Secondary Hyperparathyroidism: outpatient PTH on 7/12 was low at 134. Not currently on binders. Hold vitamin D agents.     LOS: 1 Shantelle Breeze 8/6/202212:55 PM

## 2020-12-27 NOTE — Progress Notes (Signed)
Patient tolerated 3 hour treatment via CVC catheter without incident. 1L UF goal achieved. Report to floor RN

## 2020-12-27 NOTE — Progress Notes (Signed)
Patient ID: Matthew [name], male   DOB: Apr 28, 1931, 87 y.o.   MRN: [id number] [facility] PROGRESS NOTE  Matthew [name] XBM:[id number] DOB: [birth year] DOA: 12/25/2020 PCP: Jodi Marble, MD  Brief history.  Patient admitted after a fall and found to have a right humerus fracture.  He leaned over to pick up a sock and lost his balance and fell over.  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 Orthopedic is recommending conservative management with sling and outpatient follow-up. PT is recommending SNF.  HPI/Subjective: Patient was having 4 out of 10 pain in the right arm, pain increased with movements.  Daughter at bedside.  No other complaints.  Assessment/Plan:  Hypotension.  Improving.  Hold Lasix.  Increase midodrine to 10 mg 3 times a day.  Patient's daughter states that his heart rate goes fast if we do not give the metoprolol.  We will change that to evening doses and cut the dose down to 12.5 mg nightly.  Physical therapy trying to do orthostatic vital signs.   Brief left arm tremor.  CT head and cervical spine was without any significant abnormality.  MRI brain was negative for any acute abnormality, showing stable medical cranial arachnoid cyst. Right humerus fracture.  Case discussed with orthopedic surgery and they recommended follow-up in 7 to 10 days as outpatient.  Continue right arm immobilizer.  As needed pain control. Type 2 diabetes mellitus with end-stage renal disease (TTS).  Holding glipizide.  On sliding scale insulin. History of CAD on low-dose metoprolol, Plavix, Crestor and Zetia. Hypothyroidism unspecified on levothyroxine Arrhythmia history on metoprolol and amiodarone. Chronic systolic congestive heart failure.  Holding Lasix at this point dialysis to manage fluid.  Limited with meds secondary to  hypotension.  Objective: Vitals:   12/27/20 0849 12/27/20 1345  BP:    Pulse:    Resp:  (P) 16  Temp:  (P) 98 F (36.7 C)  SpO2: 92% (P) 95%    Intake/Output Summary (Last 24 hours) at 12/27/2020 1435 Last data filed at 12/27/2020 1037 Gross per 24 hour  Intake 480 ml  Output 500 ml  Net -20 ml    Filed Weights   12/25/20 0627  Weight: 97.5 kg    Exam: General.  Well-developed elderly man, in no acute distress. Pulmonary.  Lungs clear bilaterally, normal respiratory effort. CV.  Regular rate and rhythm, no JVD, rub or murmur. Abdomen.  Soft, nontender, nondistended, BS positive. CNS.  Alert and oriented x3.  No focal neurologic deficit. Extremities.  No edema, no cyanosis, pulses intact and symmetrical.  Right arm with sling, left with bandage with mild edema. Psychiatry.  Judgment and insight appears normal.   Data Reviewed: Basic Metabolic Panel: Recent Labs  Lab 12/25/20 0831 12/26/20 0501  NA 135 134*  K 5.2* 4.6  CL 100 99  CO2 28 29  GLUCOSE 129* 100*  BUN 53* 37*  CREATININE 3.45* 3.49*  CALCIUM 9.3 8.6*    Liver Function Tests: Recent Labs  Lab 12/25/20 0831  AST 35  ALT 34  ALKPHOS 94  BILITOT 0.9  PROT 6.6  ALBUMIN 3.4*    CBC: Recent Labs  Lab 12/25/20 0831  WBC 7.8  NEUTROABS 5.6  HGB 11.5*  HCT 34.0*  MCV 99.1  PLT 136*     CBG: Recent Labs  Lab 12/26/20 1611 12/26/20 2030 12/27/20 0745 12/27/20 0756 12/27/20 [place]  124* 120* 127* 125* 139*     Recent Results (from the past 240 hour(s))  Resp Panel by RT-PCR (Flu A&B, Covid) Nasopharyngeal Swab     Status: None   Collection Time: 12/25/20  8:46 AM   Specimen: Nasopharyngeal Swab; Nasopharyngeal(NP) swabs in vial transport medium  Result Value Ref Range Status   SARS Coronavirus 2 by RT PCR NEGATIVE NEGATIVE Final    Comment: (NOTE) SARS-CoV-2 target nucleic acids are NOT DETECTED.  The SARS-CoV-2 RNA is generally detectable in upper respiratory specimens  during the acute phase of infection. The lowest concentration of SARS-CoV-2 viral copies this assay can detect is 138 copies/mL. A negative result does not preclude SARS-Cov-2 infection and should not be used as the sole basis for treatment or other patient management decisions. A negative result may occur with  improper specimen collection/handling, submission of specimen other than nasopharyngeal swab, presence of viral mutation(s) within the areas targeted by this assay, and inadequate number of viral copies(<138 copies/mL). A negative result must be combined with clinical observations, patient history, and epidemiological information. The expected result is Negative.  Fact Sheet for Patients:  EntrepreneurPulse.com.au  Fact Sheet for Healthcare Providers:  IncredibleEmployment.be  This test is no t yet approved or cleared by the [place] FDA and  has been authorized for detection and/or diagnosis of SARS-CoV-2 by FDA under an Emergency Use Authorization (EUA). This EUA will remain  in effect (meaning this test can be used) for the duration of the COVID-19 declaration under Section 564(b)(1) of the Act, 21 U.S.C.section 360bbb-3(b)(1), unless the authorization is terminated  or revoked sooner.       Influenza A by PCR NEGATIVE NEGATIVE Final   Influenza B by PCR NEGATIVE NEGATIVE Final    Comment: (NOTE) The Xpert Xpress SARS-CoV-2/FLU/RSV plus assay is intended as an aid in the diagnosis of influenza from Nasopharyngeal swab specimens and should not be used as a sole basis for treatment. Nasal washings and aspirates are unacceptable for Xpert Xpress SARS-CoV-2/FLU/RSV testing.  Fact Sheet for Patients: EntrepreneurPulse.com.au  Fact Sheet for Healthcare Providers: IncredibleEmployment.be  This test is not yet approved or cleared by the [place] FDA and has been authorized for detection  and/or diagnosis of SARS-CoV-2 by FDA under an Emergency Use Authorization (EUA). This EUA will remain in effect (meaning this test can be used) for the duration of the COVID-19 declaration under Section 564(b)(1) of the Act, 21 U.S.C. section 360bbb-3(b)(1), unless the authorization is terminated or revoked.  Performed at [facility], [address] [place], [place] [address]       Studies: CT HEAD WO CONTRAST (5MM)  Result Date: 12/26/2020 CLINICAL DATA:  Head trauma, minor (Age >= 65y); Neck trauma (Age >= 65y). Fall. EXAM: CT HEAD WITHOUT CONTRAST CT CERVICAL SPINE WITHOUT CONTRAST TECHNIQUE: Multidetector CT imaging of the head and cervical spine was performed following the standard protocol without intravenous contrast. Multiplanar CT image reconstructions of the cervical spine were also generated. COMPARISON:  Head CT 02/15/2018.  Cervical spine CT 01/18/2018. FINDINGS: CT HEAD FINDINGS Brain: No acute infarct, intracranial hemorrhage, intra-axial mass, midline shift, or extra-axial fluid collection is identified. Hypodensities in the cerebral white matter are unchanged and nonspecific but compatible with mild chronic small vessel ischemic disease. A chronic lacunar infarct in the right centrum semiovale is unchanged. There is an unchanged arachnoid cyst in the right middle cranial fossa. Mild cerebral atrophy is within normal limits for age. Vascular: Calcified atherosclerosis at the skull base.  No hyperdense vessel. Skull: No fracture or suspicious osseous lesion. Sinuses/Orbits: Small mucous retention cyst in the left maxillary sinus. Clear mastoid air cells. Bilateral cataract extraction. Other: None. CT CERVICAL SPINE FINDINGS Alignment: Chronic straightening of the normal cervical lordosis. Unchanged trace anterolisthesis of C7 on T1. Skull base and vertebrae: Interbody and posterior element ankylosis at C2-3 and C3-4. No acute fracture or suspicious osseous lesion. Soft  tissues and spinal canal: No prevertebral fluid or swelling. No visible canal hematoma. Disc levels: Bulky anterior vertebral spurring from C4-C7 with associated disc space narrowing, moderate at C6-7. Asymmetrically severe right facet arthrosis at C7-T1. Moderate multilevel neural foraminal stenosis and suspected mild-to-moderate multilevel spinal stenosis. Upper chest: Clear lung apices. Other: Partially visualized right jugular catheter. Carotid atherosclerosis. IMPRESSION: 1. No evidence of acute intracranial abnormality. 2. Mild chronic small vessel ischemic disease. 3. Unchanged right middle cranial fossa arachnoid cyst. 4. No acute cervical spine fracture or traumatic malalignment. Electronically Signed   By: Logan Bores M.D.   On: 12/26/2020 15:16   CT CERVICAL SPINE WO CONTRAST  Result Date: 12/26/2020 CLINICAL DATA:  Head trauma, minor (Age >= 65y); Neck trauma (Age >= 65y). Fall. EXAM: CT HEAD WITHOUT CONTRAST CT CERVICAL SPINE WITHOUT CONTRAST TECHNIQUE: Multidetector CT imaging of the head and cervical spine was performed following the standard protocol without intravenous contrast. Multiplanar CT image reconstructions of the cervical spine were also generated. COMPARISON:  Head CT 02/15/2018.  Cervical spine CT 01/18/2018. FINDINGS: CT HEAD FINDINGS Brain: No acute infarct, intracranial hemorrhage, intra-axial mass, midline shift, or extra-axial fluid collection is identified. Hypodensities in the cerebral white matter are unchanged and nonspecific but compatible with mild chronic small vessel ischemic disease. A chronic lacunar infarct in the right centrum semiovale is unchanged. There is an unchanged arachnoid cyst in the right middle cranial fossa. Mild cerebral atrophy is within normal limits for age. Vascular: Calcified atherosclerosis at the skull base. No hyperdense vessel. Skull: No fracture or suspicious osseous lesion. Sinuses/Orbits: Small mucous retention cyst in the left maxillary  sinus. Clear mastoid air cells. Bilateral cataract extraction. Other: None. CT CERVICAL SPINE FINDINGS Alignment: Chronic straightening of the normal cervical lordosis. Unchanged trace anterolisthesis of C7 on T1. Skull base and vertebrae: Interbody and posterior element ankylosis at C2-3 and C3-4. No acute fracture or suspicious osseous lesion. Soft tissues and spinal canal: No prevertebral fluid or swelling. No visible canal hematoma. Disc levels: Bulky anterior vertebral spurring from C4-C7 with associated disc space narrowing, moderate at C6-7. Asymmetrically severe right facet arthrosis at C7-T1. Moderate multilevel neural foraminal stenosis and suspected mild-to-moderate multilevel spinal stenosis. Upper chest: Clear lung apices. Other: Partially visualized right jugular catheter. Carotid atherosclerosis. IMPRESSION: 1. No evidence of acute intracranial abnormality. 2. Mild chronic small vessel ischemic disease. 3. Unchanged right middle cranial fossa arachnoid cyst. 4. No acute cervical spine fracture or traumatic malalignment. Electronically Signed   By: Logan Bores M.D.   On: 12/26/2020 15:16   MR BRAIN WO CONTRAST  Result Date: 12/26/2020 CLINICAL DATA:  Acute neurologic deficit EXAM: MRI HEAD WITHOUT CONTRAST TECHNIQUE: Multiplanar, multiecho pulse sequences of the brain and surrounding structures were obtained without intravenous contrast. COMPARISON:  None. FINDINGS: Brain: No acute infarct, mass effect or extra-axial collection. No acute or chronic hemorrhage. There is multifocal hyperintense T2-weighted signal within the white matter. Generalized volume loss without a clear lobar predilection. Right middle cranial fossa arachnoid cyst. Old left cerebellar infarct. The midline structures are normal. Vascular: Major flow voids are preserved.  Skull and upper cervical spine: Normal calvarium and skull base. Visualized upper cervical spine and soft tissues are normal. Sinuses/Orbits:No paranasal sinus  fluid levels or advanced mucosal thickening. No mastoid or middle ear effusion. Normal orbits. IMPRESSION: 1. No acute intracranial abnormality. 2. Findings of chronic ischemic microangiopathy and generalized volume loss. Electronically Signed   By: Ulyses Jarred M.D.   On: 12/26/2020 21:25    Scheduled Meds:  amiodarone  200 mg Oral Daily   Chlorhexidine Gluconate Cloth  6 each Topical Q0600   cholecalciferol  1,000 Units Oral Q breakfast   clopidogrel  75 mg Oral Q breakfast   ezetimibe  10 mg Oral QAC breakfast   ferrous sulfate  325 mg Oral BID WC   heparin  5,000 Units Subcutaneous Q8H   insulin aspart  0-6 Units Subcutaneous TID WC   levothyroxine  75 mcg Oral QAC breakfast   loratadine  10 mg Oral q morning   metoprolol succinate  12.5 mg Oral QHS   midodrine  10 mg Oral QID   multivitamin  1 tablet Oral Daily   omega-3 acid ethyl esters  4,000 mg Oral Q lunch   rosuvastatin  40 mg Oral QHS   sodium chloride flush  3 mL Intravenous Q12H   Continuous Infusions:  sodium chloride       Code Status:     Code Status Orders  (From admission, onward)           Start     Ordered   12/25/20 1343  Do not attempt resuscitation (DNR)  Continuous       Question Answer Comment  In the event of cardiac or respiratory ARREST Do not call a "code blue"   In the event of cardiac or respiratory ARREST Do not perform Intubation, CPR, defibrillation or ACLS   In the event of cardiac or respiratory ARREST Use medication by any route, position, wound care, and other measures to relive pain and suffering. May use oxygen, suction and manual treatment of airway obstruction as needed for comfort.   Comments Code status was discussed with patient and he is a DNR      12/25/20 1342           Code Status History     Date Active Date Inactive Code Status Order ID Comments User Context   11/01/2020 1643 11/05/2020 2001 DNR [id number]  Kayleen Memos, DO ED   11/01/2020 1554 11/01/2020 1643 Full  Code [id number]  Kayleen Memos, DO ED   11/01/2020 1553 11/01/2020 1554 Full Code [id number]  Kayleen Memos, DO ED   05/24/2019 0431 05/26/2019 1925 Full Code [id number]  Mansy, Arvella Merles, MD ED   02/15/2018 1910 02/17/2018 1529 DNR [id number]  Saundra Shelling, MD Inpatient   01/19/2018 0104 01/20/2018 1812 DNR [id number]  Amelia Jo, MD Inpatient   01/10/2018 1248 01/15/2018 1546 DNR [id number]  Loletha Grayer, MD Inpatient   01/10/2018 1224 01/10/2018 1248 Full Code [id number]  Loletha Grayer, MD Inpatient   12/09/2017 1835 12/11/2017 1508 Full Code [id number]  Loletha Grayer, MD Inpatient   12/06/2013 1125 12/07/2013 1536 Full Code [id number]  Belva Crome, MD Inpatient   12/04/2013 0955 12/06/2013 1125 Full Code [id number]  Leonie Man, MD Inpatient   12/03/2013 0407 12/04/2013 0955 Full Code [id number]  Rise Patience, MD Inpatient      Advance Directive Documentation    Flowsheet Row Most Recent Value  Type of Advance Directive Healthcare Power of Innovation, Living  will  Pre-existing out of facility DNR order (yellow form or pink MOST form) --  "MOST" Form in Place? --      Family Communication: Discussed with daughter at bedside. Disposition Plan: Status is: Inpatient  Dispo: The patient is from: Home              Anticipated d/c is to: SNF                           patient currently being treated with midodrine for hypotension   Difficult to place patient.  No.  Consultants: Case discussed with orthopedic surgery and recommended outpatient follow-up. Nephrology  Time spent: 27 minutes, case discussed with nephrology.  This record has been created using Systems analyst. Errors have been sought and corrected,but may not always be located. Such creation errors do not reflect on the standard of care.   [facility]

## 2020-12-27 NOTE — Consult Note (Signed)
Matthew [name] is a 87 y.o. male  [id number]  Primary Cardiologist: Neoma Laming, MD Reason for Consultation: hypotension  HPI: Matthew [name] is a 87 y.o. male with medical history significant for diabetes mellitus with complications of end-stage renal disease on hemodialysis (dialysis days are Tuesday/Thursday/Saturday), coronary artery disease status post CABG, hypertension who presents to the ER for evaluation after he fell this morning while getting ready to go to dialysis. Patient states that he leaned over to pick up a sock when he lost his balance and fell landing on his right shoulder.  He denied feeling dizzy or lightheaded and did not hit his head.  There was no loss of consciousness. He denies having any chest pain, no shortness of breath, no nausea, no vomiting, no dizziness, no lightheadedness, no headache, no abdominal pain, no changes in his bowel habits, no fever, no chills, no cough, no urinary symptoms, no palpitations or diaphoresis.   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     amiodarone (PACERONE) 200 MG tablet Take 200 mg by mouth daily.     cholecalciferol (VITAMIN D) 25 MCG (1000 UNIT) tablet Take 1,000 Units by mouth daily with breakfast.     clopidogrel (PLAVIX) 75 MG tablet Take 1  tablet (75 mg total) by mouth daily with breakfast. 30 tablet 12   ezetimibe (ZETIA) 10 MG tablet Take 10 mg by mouth daily before breakfast.     ferrous sulfate 325 (65 FE) MG tablet Take 325 mg by mouth 2 (two) times daily with a meal.      fluticasone (FLONASE) 50 MCG/ACT nasal spray Place 2 sprays into both nostrils daily as needed for allergies or rhinitis.     furosemide (LASIX) 40 MG tablet Take 2 tablets (80 mg total) by mouth 2 (two) times daily. (Patient taking differently: Take 40 mg by mouth daily.) 60 tablet 0   gabapentin (NEURONTIN) 300 MG capsule Take 300 mg by mouth 3 (three) times daily.     glipiZIDE (GLUCOTROL) 5 MG tablet Take 2.5 mg by mouth daily before breakfast.     levocetirizine (XYZAL) 5 MG tablet Take 10 mg by mouth every morning.      levothyroxine (SYNTHROID) 75 MCG tablet Take 75 mcg by mouth daily before breakfast.     metoprolol succinate (TOPROL-XL) 50 MG 24 hr tablet Take 25 mg by mouth daily with lunch.     midodrine (PROAMATINE) 10 MG tablet Take 10 mg by mouth daily. (Take 1 hour before peritoneal dialysis)     multivitamin (RENA-VIT) TABS tablet Take 1 tablet by mouth daily.     nitroGLYCERIN (NITROSTAT) 0.4 MG SL tablet Place 0.4 mg under the  tongue every 5 (five) minutes as needed for chest pain.     Omega-3 Fatty Acids (FISH OIL) 1000 MG CAPS Take 4,000 mg by mouth daily with lunch.     potassium chloride SA (KLOR-CON) 20 MEQ tablet Take 20 mEq by mouth daily.     rosuvastatin (CRESTOR) 40 MG tablet Take 40 mg by mouth at bedtime.        amiodarone  200 mg Oral Daily   Chlorhexidine Gluconate Cloth  6 each Topical Q0600   cholecalciferol  1,000 Units Oral Q breakfast   clopidogrel  75 mg Oral Q breakfast   ezetimibe  10 mg Oral QAC breakfast   ferrous sulfate  325 mg Oral BID WC   heparin  5,000 Units Subcutaneous Q8H   insulin aspart  0-6 Units Subcutaneous TID WC   levothyroxine  75 mcg Oral QAC breakfast   loratadine  10 mg Oral q morning    metoprolol succinate  12.5 mg Oral QHS   midodrine  10 mg Oral QID   multivitamin  1 tablet Oral Daily   omega-3 acid ethyl esters  4,000 mg Oral Q lunch   rosuvastatin  40 mg Oral QHS   sodium chloride flush  3 mL Intravenous Q12H    Infusions:  sodium chloride      Allergies  Allergen Reactions   Other     Other reaction(s): Myalgias (intolerance), Other (See Comments) Statin Drugs  Statin Drugs     Amoxicillin Diarrhea    Social History   Socioeconomic History   Marital status: Married    Spouse name: Not on file   Number of children: Not on file   Years of education: Not on file   Highest education level: Not on file  Occupational History   Not on file  Tobacco Use   Smoking status: Former    Types: Cigarettes   Smokeless tobacco: Never   Tobacco comments:    quit 30 yrs ago, smoked 14-15 yrs, mainly pipe  Vaping Use   Vaping Use: Never used  Substance and Sexual Activity   Alcohol use: Not Currently    Alcohol/week: 1.0 standard drink    Types: 1 Cans of beer per week    Comment: occasionally drinking only,once a month   Drug use: No   Sexual activity: Never  Other Topics Concern   Not on file  Social History Narrative   Not on file   Social Determinants of Health   Financial Resource Strain: Not on file  Food Insecurity: Not on file  Transportation Needs: Not on file  Physical Activity: Not on file  Stress: Not on file  Social Connections: Not on file  Intimate Partner Violence: Not on file    Family History  Problem Relation Age of Onset   Heart attack Father 72       died first MI at age 39   Heart failure Mother     PHYSICAL EXAM: Vitals:   12/27/20 0402 12/27/20 0744  BP: (!) 89/55 (!) 126/51  Pulse: 74 72  Resp: 20 20  Temp: 99.5 F (37.5 C) 98.2 F (36.8 C)  SpO2: 96% 92%     Intake/Output Summary (Last 24 hours) at 12/27/2020 0826 Last data filed at 12/27/2020 0457 Gross per 24 hour  Intake 660 ml  Output 600 ml  Net 60  ml    General:  Well appearing. No respiratory difficulty HEENT: normal Neck: supple. no JVD. Carotids 2+ bilat; no bruits. No  lymphadenopathy or thryomegaly appreciated. Cor: PMI nondisplaced. Regular rate & rhythm. No rubs, gallops or murmurs. Lungs: clear Abdomen: soft, nontender, nondistended. No hepatosplenomegaly. No bruits or masses. Good bowel sounds. Extremities: no cyanosis, clubbing, rash, edema Neuro: alert & oriented x 3, cranial nerves grossly intact. moves all 4 extremities w/o difficulty. Affect pleasant.   Results for orders placed or performed during the hospital encounter of 12/25/20 (from the past 24 hour(s))  Glucose, capillary     Status: Abnormal   Collection Time: 12/26/20 11:33 AM  Result Value Ref Range   Glucose-Capillary 139 (H) 70 - 99 mg/dL  Glucose, capillary     Status: Abnormal   Collection Time: 12/26/20  4:11 PM  Result Value Ref Range   Glucose-Capillary 124 (H) 70 - 99 mg/dL  Glucose, capillary     Status: Abnormal   Collection Time: 12/26/20  8:30 PM  Result Value Ref Range   Glucose-Capillary 120 (H) 70 - 99 mg/dL  Glucose, capillary     Status: Abnormal   Collection Time: 12/27/20  7:45 AM  Result Value Ref Range   Glucose-Capillary 127 (H) 70 - 99 mg/dL   Comment 1 Notify RN   Glucose, capillary     Status: Abnormal   Collection Time: 12/27/20  7:56 AM  Result Value Ref Range   Glucose-Capillary 125 (H) 70 - 99 mg/dL   CT HEAD WO CONTRAST (5MM)  Result Date: 12/26/2020 CLINICAL DATA:  Head trauma, minor (Age >= 65y); Neck trauma (Age >= 65y). Fall. EXAM: CT HEAD WITHOUT CONTRAST CT CERVICAL SPINE WITHOUT CONTRAST TECHNIQUE: Multidetector CT imaging of the head and cervical spine was performed following the standard protocol without intravenous contrast. Multiplanar CT image reconstructions of the cervical spine were also generated. COMPARISON:  Head CT 02/15/2018.  Cervical spine CT 01/18/2018. FINDINGS: CT HEAD FINDINGS Brain: No acute  infarct, intracranial hemorrhage, intra-axial mass, midline shift, or extra-axial fluid collection is identified. Hypodensities in the cerebral white matter are unchanged and nonspecific but compatible with mild chronic small vessel ischemic disease. A chronic lacunar infarct in the right centrum semiovale is unchanged. There is an unchanged arachnoid cyst in the right middle cranial fossa. Mild cerebral atrophy is within normal limits for age. Vascular: Calcified atherosclerosis at the skull base. No hyperdense vessel. Skull: No fracture or suspicious osseous lesion. Sinuses/Orbits: Small mucous retention cyst in the left maxillary sinus. Clear mastoid air cells. Bilateral cataract extraction. Other: None. CT CERVICAL SPINE FINDINGS Alignment: Chronic straightening of the normal cervical lordosis. Unchanged trace anterolisthesis of C7 on T1. Skull base and vertebrae: Interbody and posterior element ankylosis at C2-3 and C3-4. No acute fracture or suspicious osseous lesion. Soft tissues and spinal canal: No prevertebral fluid or swelling. No visible canal hematoma. Disc levels: Bulky anterior vertebral spurring from C4-C7 with associated disc space narrowing, moderate at C6-7. Asymmetrically severe right facet arthrosis at C7-T1. Moderate multilevel neural foraminal stenosis and suspected mild-to-moderate multilevel spinal stenosis. Upper chest: Clear lung apices. Other: Partially visualized right jugular catheter. Carotid atherosclerosis. IMPRESSION: 1. No evidence of acute intracranial abnormality. 2. Mild chronic small vessel ischemic disease. 3. Unchanged right middle cranial fossa arachnoid cyst. 4. No acute cervical spine fracture or traumatic malalignment. Electronically Signed   By: Logan Bores M.D.   On: 12/26/2020 15:16   CT CERVICAL SPINE WO CONTRAST  Result Date: 12/26/2020 CLINICAL DATA:  Head trauma, minor (Age >= 65y); Neck trauma (Age >= 65y). Fall. EXAM: CT HEAD WITHOUT CONTRAST CT CERVICAL  SPINE  WITHOUT CONTRAST TECHNIQUE: Multidetector CT imaging of the head and cervical spine was performed following the standard protocol without intravenous contrast. Multiplanar CT image reconstructions of the cervical spine were also generated. COMPARISON:  Head CT 02/15/2018.  Cervical spine CT 01/18/2018. FINDINGS: CT HEAD FINDINGS Brain: No acute infarct, intracranial hemorrhage, intra-axial mass, midline shift, or extra-axial fluid collection is identified. Hypodensities in the cerebral white matter are unchanged and nonspecific but compatible with mild chronic small vessel ischemic disease. A chronic lacunar infarct in the right centrum semiovale is unchanged. There is an unchanged arachnoid cyst in the right middle cranial fossa. Mild cerebral atrophy is within normal limits for age. Vascular: Calcified atherosclerosis at the skull base. No hyperdense vessel. Skull: No fracture or suspicious osseous lesion. Sinuses/Orbits: Small mucous retention cyst in the left maxillary sinus. Clear mastoid air cells. Bilateral cataract extraction. Other: None. CT CERVICAL SPINE FINDINGS Alignment: Chronic straightening of the normal cervical lordosis. Unchanged trace anterolisthesis of C7 on T1. Skull base and vertebrae: Interbody and posterior element ankylosis at C2-3 and C3-4. No acute fracture or suspicious osseous lesion. Soft tissues and spinal canal: No prevertebral fluid or swelling. No visible canal hematoma. Disc levels: Bulky anterior vertebral spurring from C4-C7 with associated disc space narrowing, moderate at C6-7. Asymmetrically severe right facet arthrosis at C7-T1. Moderate multilevel neural foraminal stenosis and suspected mild-to-moderate multilevel spinal stenosis. Upper chest: Clear lung apices. Other: Partially visualized right jugular catheter. Carotid atherosclerosis. IMPRESSION: 1. No evidence of acute intracranial abnormality. 2. Mild chronic small vessel ischemic disease. 3. Unchanged right  middle cranial fossa arachnoid cyst. 4. No acute cervical spine fracture or traumatic malalignment. Electronically Signed   By: Logan Bores M.D.   On: 12/26/2020 15:16   MR BRAIN WO CONTRAST  Result Date: 12/26/2020 CLINICAL DATA:  Acute neurologic deficit EXAM: MRI HEAD WITHOUT CONTRAST TECHNIQUE: Multiplanar, multiecho pulse sequences of the brain and surrounding structures were obtained without intravenous contrast. COMPARISON:  None. FINDINGS: Brain: No acute infarct, mass effect or extra-axial collection. No acute or chronic hemorrhage. There is multifocal hyperintense T2-weighted signal within the white matter. Generalized volume loss without a clear lobar predilection. Right middle cranial fossa arachnoid cyst. Old left cerebellar infarct. The midline structures are normal. Vascular: Major flow voids are preserved. Skull and upper cervical spine: Normal calvarium and skull base. Visualized upper cervical spine and soft tissues are normal. Sinuses/Orbits:No paranasal sinus fluid levels or advanced mucosal thickening. No mastoid or middle ear effusion. Normal orbits. IMPRESSION: 1. No acute intracranial abnormality. 2. Findings of chronic ischemic microangiopathy and generalized volume loss. Electronically Signed   By: Ulyses Jarred M.D.   On: 12/26/2020 21:25     ASSESSMENT AND PLAN: Patient feeling well. Blood pressure improved on fluids and midodrine. Advise continuing midodrine. Follow up in office after discharge.   Engineer, drilling FNP-C

## 2020-12-27 NOTE — Progress Notes (Signed)
OT Cancellation Note  Patient Details Name: Matthew [name] MRN: [id number] DOB: March 21, 1931   Cancelled Treatment:     Pt currently in dialysis this date, will continue attempts for OT evaluation.   Braxston Quinter T Nishka Heide, OTR/L, CLT   Nikko Goldwire 12/27/2020, 1:58 PM

## 2020-12-28 LAB — BASIC METABOLIC PANEL
Anion gap: 9 (ref 5–15)
BUN: 34 mg/dL — ABNORMAL HIGH (ref 8–23)
CO2: 29 mmol/L (ref 22–32)
Calcium: 8.7 mg/dL — ABNORMAL LOW (ref 8.9–10.3)
Chloride: 97 mmol/L — ABNORMAL LOW (ref 98–111)
Creatinine, Ser: 3.29 mg/dL — ABNORMAL HIGH (ref 0.61–1.24)
GFR, Estimated: 17 mL/min — ABNORMAL LOW (ref >60)
Glucose, Bld: 142 mg/dL — ABNORMAL HIGH (ref 70–99)
Potassium: 4.4 mmol/L (ref 3.5–5.1)
Sodium: 135 mmol/L (ref 135–145)

## 2020-12-28 LAB — CBC
HCT: 27.9 % — ABNORMAL LOW (ref 39.0–52.0)
Hemoglobin: 9.5 g/dL — ABNORMAL LOW (ref 13.0–17.0)
MCH: 33 pg (ref 26.0–34.0)
MCHC: 34.1 g/dL (ref 30.0–36.0)
MCV: 96.9 fL (ref 80.0–100.0)
Platelets: 133 10*3/uL — ABNORMAL LOW (ref 150–400)
RBC: 2.88 MIL/uL — ABNORMAL LOW (ref 4.22–5.81)
RDW: 13.4 % (ref 11.5–15.5)
WBC: 7.5 10*3/uL (ref 4.0–10.5)
nRBC: 0 % (ref 0.0–0.2)

## 2020-12-28 LAB — GLUCOSE, CAPILLARY
Glucose-Capillary: 125 mg/dL — ABNORMAL HIGH (ref 70–99)
Glucose-Capillary: 144 mg/dL — ABNORMAL HIGH (ref 70–99)
Glucose-Capillary: 163 mg/dL — ABNORMAL HIGH (ref 70–99)
Glucose-Capillary: 184 mg/dL — ABNORMAL HIGH (ref 70–99)

## 2020-12-28 MED ORDER — BISACODYL 10 MG RE SUPP
10.0000 mg | Freq: Every day | RECTAL | Status: DC | PRN
  Administered 2020-12-28: 10 mg via RECTAL
  Filled 2020-12-28: qty 1

## 2020-12-28 MED ORDER — TRAMADOL HCL 50 MG PO TABS
50.0000 mg | ORAL_TABLET | Freq: Four times a day (QID) | ORAL | Status: DC | PRN
  Administered 2020-12-28 – 2021-01-05 (×6): 50 mg via ORAL
  Filled 2020-12-28 (×6): qty 1

## 2020-12-28 MED ORDER — SENNOSIDES-DOCUSATE SODIUM 8.6-50 MG PO TABS
2.0000 | ORAL_TABLET | Freq: Two times a day (BID) | ORAL | Status: DC
  Administered 2020-12-28 – 2021-01-06 (×19): 2 via ORAL
  Filled 2020-12-28 (×20): qty 2

## 2020-12-28 MED ORDER — POLYETHYLENE GLYCOL 3350 17 G PO PACK
17.0000 g | PACK | Freq: Every day | ORAL | Status: DC
  Administered 2020-12-28 – 2021-01-06 (×10): 17 g via ORAL
  Filled 2020-12-28 (×11): qty 1

## 2020-12-28 NOTE — Progress Notes (Signed)
[facility]  ROUNDING NOTE   Subjective:   [name] was admitted to [facility] on 12/25/2020 for Fall [W19.XXXA] Closed fracture of proximal end of right humerus, unspecified fracture morphology, initial encounter [S42.201A] Hypotension [I95.9]  Patient seen resting in bed Daughter at bedside Denies nausea and vomiting Denies shortness of breath Working with PT/OT  Dialysis yesterday, tolerated well   Objective:  Vital signs in last 24 hours:  Temp:  [98 F (36.7 C)-98.9 F (37.2 C)] 98.2 F (36.8 C) (08/07 0750) Pulse Rate:  [65-106] 68 (08/07 1057) Resp:  [10-25] 16 (08/07 0750) BP: (87-139)/(49-67) 139/64 (08/07 1057) SpO2:  [91 %-98 %] 97 % (08/07 0752)  Weight change:  Filed Weights   12/25/20 0627  Weight: 97.5 kg    Intake/Output: I/O last 3 completed shifts: In: 243 [P.O.:240; I.V.:3] Out: 1750 [Urine:750; Other:1000]   Intake/Output this shift:  No intake/output data recorded.  Physical Exam: General: NAD, laying in bed  Head: Normocephalic, atraumatic. Moist oral mucosal membranes  Eyes: Anicteric  Lungs:  Clear to auscultation,normal effort  Heart: Regular rate and rhythm  Abdomen:  Soft, nontender  Extremities:  no peripheral edema.  Neurologic: Nonfocal, moving all four extremities  Skin: No lesions  Access: RIJ permcath    Basic Metabolic Panel: Recent Labs  Lab 12/25/20 0831 12/26/20 0501 12/28/20 0900  NA 135 134* 135  K 5.2* 4.6 4.4  CL 100 99 97*  CO2 28 29 29   GLUCOSE 129* 100* 142*  BUN 53* 37* 34*  CREATININE 3.45* 3.49* 3.29*  CALCIUM 9.3 8.6* 8.7*     Liver Function Tests: Recent Labs  Lab 12/25/20 0831  AST 35  ALT 34  ALKPHOS 94  BILITOT 0.9  PROT 6.6  ALBUMIN 3.4*    No results for input(s): LIPASE, AMYLASE in the last 168 hours. No results for input(s): AMMONIA in the last 168 hours.  CBC: Recent Labs  Lab 12/25/20 0831 12/28/20 0900  WBC 7.8 7.5  NEUTROABS 5.6  --   HGB 11.5*  9.5*  HCT 34.0* 27.9*  MCV 99.1 96.9  PLT 136* 133*     Cardiac Enzymes: No results for input(s): CKTOTAL, CKMB, CKMBINDEX, TROPONINI in the last 168 hours.  BNP: Invalid input(s): POCBNP  CBG: Recent Labs  Lab 12/27/20 1155 12/27/20 1719 12/27/20 2139 12/28/20 0751 12/28/20 1147  GLUCAP 139* 166* 216* 125* 144*     Microbiology: Results for orders placed or performed during the hospital encounter of 12/25/20  Resp Panel by RT-PCR (Flu A&B, Covid) Nasopharyngeal Swab     Status: None   Collection Time: 12/25/20  8:46 AM   Specimen: Nasopharyngeal Swab; Nasopharyngeal(NP) swabs in vial transport medium  Result Value Ref Range Status   SARS Coronavirus 2 by RT PCR NEGATIVE NEGATIVE Final    Comment: (NOTE) SARS-CoV-2 target nucleic acids are NOT DETECTED.  The SARS-CoV-2 RNA is generally detectable in upper respiratory specimens during the acute phase of infection. The lowest concentration of SARS-CoV-2 viral copies this assay can detect is 138 copies/mL. A negative result does not preclude SARS-Cov-2 infection and should not be used as the sole basis for treatment or other patient management decisions. A negative result may occur with  improper specimen collection/handling, submission of specimen other than nasopharyngeal swab, presence of viral mutation(s) within the areas targeted by this assay, and inadequate number of viral copies(<138 copies/mL). A negative result must be combined with clinical observations, patient history, and epidemiological information. The expected result  is Negative.  Fact Sheet for Patients:  EntrepreneurPulse.com.au  Fact Sheet for Healthcare Providers:  IncredibleEmployment.be  This test is no t yet approved or cleared by the [place] FDA and  has been authorized for detection and/or diagnosis of SARS-CoV-2 by FDA under an Emergency Use Authorization (EUA). This EUA will remain  in effect  (meaning this test can be used) for the duration of the COVID-19 declaration under Section 564(b)(1) of the Act, 21 U.S.C.section 360bbb-3(b)(1), unless the authorization is terminated  or revoked sooner.       Influenza A by PCR NEGATIVE NEGATIVE Final   Influenza B by PCR NEGATIVE NEGATIVE Final    Comment: (NOTE) The Xpert Xpress SARS-CoV-2/FLU/RSV plus assay is intended as an aid in the diagnosis of influenza from Nasopharyngeal swab specimens and should not be used as a sole basis for treatment. Nasal washings and aspirates are unacceptable for Xpert Xpress SARS-CoV-2/FLU/RSV testing.  Fact Sheet for Patients: EntrepreneurPulse.com.au  Fact Sheet for Healthcare Providers: IncredibleEmployment.be  This test is not yet approved or cleared by the [place] FDA and has been authorized for detection and/or diagnosis of SARS-CoV-2 by FDA under an Emergency Use Authorization (EUA). This EUA will remain in effect (meaning this test can be used) for the duration of the COVID-19 declaration under Section 564(b)(1) of the Act, 21 U.S.C. section 360bbb-3(b)(1), unless the authorization is terminated or revoked.  Performed at [facility], [address] [place], [place] [address]     Coagulation Studies: No results for input(s): LABPROT, INR in the last 72 hours.  Urinalysis: No results for input(s): COLORURINE, LABSPEC, PHURINE, GLUCOSEU, HGBUR, BILIRUBINUR, KETONESUR, PROTEINUR, UROBILINOGEN, NITRITE, LEUKOCYTESUR in the last 72 hours.  Invalid input(s): APPERANCEUR    Imaging: CT HEAD WO CONTRAST (5MM)  Result Date: 12/26/2020 CLINICAL DATA:  Head trauma, minor (Age >= 65y); Neck trauma (Age >= 65y). Fall. EXAM: CT HEAD WITHOUT CONTRAST CT CERVICAL SPINE WITHOUT CONTRAST TECHNIQUE: Multidetector CT imaging of the head and cervical spine was performed following the standard protocol without intravenous contrast. Multiplanar CT  image reconstructions of the cervical spine were also generated. COMPARISON:  Head CT 02/15/2018.  Cervical spine CT 01/18/2018. FINDINGS: CT HEAD FINDINGS Brain: No acute infarct, intracranial hemorrhage, intra-axial mass, midline shift, or extra-axial fluid collection is identified. Hypodensities in the cerebral white matter are unchanged and nonspecific but compatible with mild chronic small vessel ischemic disease. A chronic lacunar infarct in the right centrum semiovale is unchanged. There is an unchanged arachnoid cyst in the right middle cranial fossa. Mild cerebral atrophy is within normal limits for age. Vascular: Calcified atherosclerosis at the skull base. No hyperdense vessel. Skull: No fracture or suspicious osseous lesion. Sinuses/Orbits: Small mucous retention cyst in the left maxillary sinus. Clear mastoid air cells. Bilateral cataract extraction. Other: None. CT CERVICAL SPINE FINDINGS Alignment: Chronic straightening of the normal cervical lordosis. Unchanged trace anterolisthesis of C7 on T1. Skull base and vertebrae: Interbody and posterior element ankylosis at C2-3 and C3-4. No acute fracture or suspicious osseous lesion. Soft tissues and spinal canal: No prevertebral fluid or swelling. No visible canal hematoma. Disc levels: Bulky anterior vertebral spurring from C4-C7 with associated disc space narrowing, moderate at C6-7. Asymmetrically severe right facet arthrosis at C7-T1. Moderate multilevel neural foraminal stenosis and suspected mild-to-moderate multilevel spinal stenosis. Upper chest: Clear lung apices. Other: Partially visualized right jugular catheter. Carotid atherosclerosis. IMPRESSION: 1. No evidence of acute intracranial abnormality. 2. Mild chronic small vessel ischemic disease. 3. Unchanged right  middle cranial fossa arachnoid cyst. 4. No acute cervical spine fracture or traumatic malalignment. Electronically Signed   By: Logan Bores M.D.   On: 12/26/2020 15:16   CT CERVICAL  SPINE WO CONTRAST  Result Date: 12/26/2020 CLINICAL DATA:  Head trauma, minor (Age >= 65y); Neck trauma (Age >= 65y). Fall. EXAM: CT HEAD WITHOUT CONTRAST CT CERVICAL SPINE WITHOUT CONTRAST TECHNIQUE: Multidetector CT imaging of the head and cervical spine was performed following the standard protocol without intravenous contrast. Multiplanar CT image reconstructions of the cervical spine were also generated. COMPARISON:  Head CT 02/15/2018.  Cervical spine CT 01/18/2018. FINDINGS: CT HEAD FINDINGS Brain: No acute infarct, intracranial hemorrhage, intra-axial mass, midline shift, or extra-axial fluid collection is identified. Hypodensities in the cerebral white matter are unchanged and nonspecific but compatible with mild chronic small vessel ischemic disease. A chronic lacunar infarct in the right centrum semiovale is unchanged. There is an unchanged arachnoid cyst in the right middle cranial fossa. Mild cerebral atrophy is within normal limits for age. Vascular: Calcified atherosclerosis at the skull base. No hyperdense vessel. Skull: No fracture or suspicious osseous lesion. Sinuses/Orbits: Small mucous retention cyst in the left maxillary sinus. Clear mastoid air cells. Bilateral cataract extraction. Other: None. CT CERVICAL SPINE FINDINGS Alignment: Chronic straightening of the normal cervical lordosis. Unchanged trace anterolisthesis of C7 on T1. Skull base and vertebrae: Interbody and posterior element ankylosis at C2-3 and C3-4. No acute fracture or suspicious osseous lesion. Soft tissues and spinal canal: No prevertebral fluid or swelling. No visible canal hematoma. Disc levels: Bulky anterior vertebral spurring from C4-C7 with associated disc space narrowing, moderate at C6-7. Asymmetrically severe right facet arthrosis at C7-T1. Moderate multilevel neural foraminal stenosis and suspected mild-to-moderate multilevel spinal stenosis. Upper chest: Clear lung apices. Other: Partially visualized right jugular  catheter. Carotid atherosclerosis. IMPRESSION: 1. No evidence of acute intracranial abnormality. 2. Mild chronic small vessel ischemic disease. 3. Unchanged right middle cranial fossa arachnoid cyst. 4. No acute cervical spine fracture or traumatic malalignment. Electronically Signed   By: Logan Bores M.D.   On: 12/26/2020 15:16   MR BRAIN WO CONTRAST  Result Date: 12/26/2020 CLINICAL DATA:  Acute neurologic deficit EXAM: MRI HEAD WITHOUT CONTRAST TECHNIQUE: Multiplanar, multiecho pulse sequences of the brain and surrounding structures were obtained without intravenous contrast. COMPARISON:  None. FINDINGS: Brain: No acute infarct, mass effect or extra-axial collection. No acute or chronic hemorrhage. There is multifocal hyperintense T2-weighted signal within the white matter. Generalized volume loss without a clear lobar predilection. Right middle cranial fossa arachnoid cyst. Old left cerebellar infarct. The midline structures are normal. Vascular: Major flow voids are preserved. Skull and upper cervical spine: Normal calvarium and skull base. Visualized upper cervical spine and soft tissues are normal. Sinuses/Orbits:No paranasal sinus fluid levels or advanced mucosal thickening. No mastoid or middle ear effusion. Normal orbits. IMPRESSION: 1. No acute intracranial abnormality. 2. Findings of chronic ischemic microangiopathy and generalized volume loss. Electronically Signed   By: Ulyses Jarred M.D.   On: 12/26/2020 21:25     Medications:    sodium chloride      amiodarone  200 mg Oral Daily   Chlorhexidine Gluconate Cloth  6 each Topical Q0600   cholecalciferol  1,000 Units Oral Q breakfast   clopidogrel  75 mg Oral Q breakfast   ezetimibe  10 mg Oral QAC breakfast   ferrous sulfate  325 mg Oral BID WC   heparin  5,000 Units Subcutaneous Q8H   insulin aspart  0-6 Units Subcutaneous TID WC   levothyroxine  75 mcg Oral QAC breakfast   loratadine  10 mg Oral q morning   metoprolol succinate   12.5 mg Oral QHS   midodrine  10 mg Oral QID   multivitamin  1 tablet Oral Daily   omega-3 acid ethyl esters  4,000 mg Oral Q lunch   polyethylene glycol  17 g Oral Daily   rosuvastatin  40 mg Oral QHS   senna-docusate  2 tablet Oral BID   sodium chloride flush  3 mL Intravenous Q12H   sodium chloride, bisacodyl, fluticasone, HYDROcodone-acetaminophen, morphine injection, nitroGLYCERIN, ondansetron **OR** ondansetron (ZOFRAN) IV, sodium chloride flush, traMADol  Assessment/ Plan:  Mr. AVEION NGUYEN is a 87 y.o. white male with end stage renal disease on hemodialysis, hypertension, atrial fibrillation, coronary artery disease status post CABG, diabetes mellitus type II, hypothyroidism, who is admitted to [facility] on 12/25/2020 for Fall [W19.XXXA] Closed fracture of proximal end of right humerus, unspecified fracture morphology, initial encounter [S42.201A] Hypotension [I95.9] Found to have a right humerus fracture.   [facility] TTS [address] RIJ permcath 98.5kg  End Stage Renal Disease: TTS, Received dialysis yesterday, tolerated well. UF goal 1L removed. Next treatment scheduled for Tuesday. Will monitor discharge plan for adjustments needed to outpatient dialysis needs.  Hypotension:  Home regimen of midodrine before dialysis and for hypertension he takes metoprolol and furosemide. Furosemide used for volume control and may be transitioned to non-dialysis days. Midodrine increased to QID. BP 139/64  Anemia of chronic kidney disease: hemoglobin at goal 9.5. Will monitor for need of EPO  Secondary Hyperparathyroidism: outpatient PTH on 7/12 was low at 134. Not currently on binders. Hold vitamin D agents.     LOS: 2   8/7/202212:41 PM

## 2020-12-28 NOTE — TOC Progression Note (Signed)
Transition of Care [facility]) - Progression Note    Patient Details  Name: Matthew [name] MRN: [id number] Date of Birth: Oct 20, 1930  Transition of Care [facility]) CM/SW Contact  Candie Chroman, LCSW Phone Number: 12/28/2020, 8:38 AM  Clinical Narrative:  No bed offers at this time. Will follow up with [facility] in the morning to see if they have a bed.   Expected Discharge Plan: [place] Barriers to Discharge: Continued Medical Work up  Expected Discharge Plan and Services Expected Discharge Plan: [place] Choice: [place] arrangements for the past 2 months: Single Family Home                                       Social Determinants of Health (SDOH) Interventions    Readmission Risk Interventions No flowsheet data found.

## 2020-12-28 NOTE — Progress Notes (Signed)
Physical Therapy Treatment Patient Details Name: Matthew [name] MRN: [id number] DOB: Sep 29, 1930 Today's Date: 12/28/2020    History of Present Illness Matthew [name] is a 87 y.o. male with medical history significant for diabetes mellitus with complications of end-stage renal disease on hemodialysis (dialysis days are Tuesday/Thursday/Saturday), coronary artery disease status post CABG, hypertension who presents to the ER for evaluation after he fell this morning while getting ready to go to dialysis.  Patient states that he leaned over to pick up a sock when he lost his balance and fell landing on his right shoulder.  He denied feeling dizzy or lightheaded and did not hit his head.  There was no loss of consciousness.    PT Comments    Co-tx with OT due to need for +2 assist for safe mobility.  Orthostatic BP's as follows Supine 120/58 P 70 Sitting 95/56 P 75 Sitting after bathing and ADL care 109/58 Standing 71/54 P 106  Pt remains orthostatic but denies dizziness.  He did stand better today with +2 assist and will need +2 for further standing attempts due to assist and orthostatic readings.  RN in room at end of session for meds and care.  Pt back in bed after session.    Follow Up Recommendations  SNF     Equipment Recommendations       Recommendations for Other Services       Precautions / Restrictions Precautions Precautions: Fall;Shoulder Precaution Comments: orthostatic Required Braces or Orthoses: Sling Restrictions Weight Bearing Restrictions: Yes RUE Weight Bearing: Non weight bearing Other Position/Activity Restrictions: in sling immobilized    Mobility  Bed Mobility Overal bed mobility: Needs Assistance Bed Mobility: Supine to Sit;Sit to Supine     Supine to sit: Min assist Sit to supine: Min assist;+2 for physical assistance        Transfers Overall transfer level: Needs assistance Equipment used: 2 person hand held assist Transfers: Sit to/from  Stand Sit to Stand: Mod assist;+2 physical assistance         General transfer comment: +2  Ambulation/Gait         Gait velocity: decreased   General Gait Details: unsafe   Stairs             Wheelchair Mobility    Modified Rankin (Stroke Patients Only)       Balance Overall balance assessment: History of Falls;Needs assistance Sitting-balance support: Single extremity supported;Feet supported Sitting balance-Leahy Scale: Fair     Standing balance support: No upper extremity supported Standing balance-Leahy Scale: Poor Standing balance comment: did better with +2 assis today but limited due to orthostatic                            Cognition Arousal/Alertness: Awake/alert Behavior During Therapy: WFL for tasks assessed/performed Overall Cognitive Status: Within Functional Limits for tasks assessed                                        Exercises Other Exercises Other Exercises: assisted with changing gowns and bathing during OT eval    General Comments        Pertinent Vitals/Pain Pain Assessment: Faces Faces Pain Scale: Hurts little more Pain Location: R arm Pain Descriptors / Indicators: Constant;[place]  Prior Function            PT Goals (current goals can now be found in the care plan section) Progress towards PT goals: Progressing toward goals    Frequency    7X/week      PT Plan Current plan remains appropriate    Co-evaluation PT/OT/SLP Co-Evaluation/Treatment: Yes Reason for Co-Treatment: Complexity of the patient's impairments (multi-system involvement) PT goals addressed during session: Mobility/safety with mobility OT goals addressed during session: Other (comment)      AM-PAC PT "6 Clicks" Mobility   Outcome Measure  Help needed turning from your back to your side while in a flat bed without using bedrails?: A Little Help needed moving  from lying on your back to sitting on the side of a flat bed without using bedrails?: A Lot Help needed moving to and from a bed to a chair (including a wheelchair)?: A Lot Help needed standing up from a chair using your arms (e.g., wheelchair or bedside chair)?: A Lot Help needed to walk in hospital room?: Total Help needed climbing 3-5 steps with a railing? : Total 6 Click Score: 11    End of Session Equipment Utilized During Treatment: Gait belt Activity Tolerance: Patient limited by pain;Treatment limited secondary to medical complications (Comment) Patient left: in bed;with call bell/phone within reach;with bed alarm set;with family/visitor present;with nursing/sitter in room Nurse Communication: Mobility status;Weight bearing status;Precautions PT Visit Diagnosis: Unsteadiness on feet (R26.81);Repeated falls (R29.6);Muscle weakness (generalized) (M62.81);History of falling (Z91.81);Difficulty in walking, not elsewhere classified (R26.2)     Time: 1030-1056 PT Time Calculation (min) (ACUTE ONLY): 26 min  Charges:  $Therapeutic Activity: 23-37 mins                    [name], PTA 12/28/20, 1:05 PM , 1:03 PM

## 2020-12-28 NOTE — Evaluation (Signed)
Occupational Therapy Evaluation Patient Details Name: Matthew [name] MRN: [id number] DOB: July 24, 1930 Today's Date: 12/28/2020    History of Present Illness Matthew [name] is a 87 y.o. male with medical history significant for diabetes mellitus with complications of end-stage renal disease on hemodialysis (dialysis days are Tuesday/Thursday/Saturday), coronary artery disease status post CABG, hypertension who presents to the ER for evaluation after he fell this morning while getting ready to go to dialysis.  Patient states that he leaned over to pick up a sock when he lost his balance and fell landing on his right shoulder.  He denied feeling dizzy or lightheaded and did not hit his head.  There was no loss of consciousness.   Clinical Impression   Chart reviewed, pt greeted semi supine in bed with daughter present. Pt +orthostatic per PT report. Pt is A&Ox4, agreeable to intervention.  Prior to hospital admission, pt lived with daughter and completes ADLs with MOD I, IADLs with assistance as needed per report.  Currently pt demonstrates impairments as described below (See OT problem list) which functionally limit his ability to perform ADL/self-care tasks. Pt +orthostatic in standing therefore he is returned to supine. No c/o dizziness/nausea throughout.  Pt would benefit from skilled OT services to address noted impairments and functional limitations in order to maximize safety and independence while minimizing falls risk and caregiver burden. Upon hospital discharge, recommend STR to maximize pt safety and return to PLOF.      Follow Up Recommendations  SNF    Equipment Recommendations   (equipment per next level of care)           Precautions / Restrictions Precautions Precautions: Fall;Other (comment) Type of Shoulder Precautions: R humerus fx- per MD note keep RUE immobilized Precaution Comments: orthostatic Required Braces or Orthoses: Sling Restrictions Weight Bearing Restrictions:  Yes RUE Weight Bearing: Non weight bearing Other Position/Activity Restrictions: in sling immobilized      Mobility Bed Mobility Overal bed mobility: Needs Assistance Bed Mobility: Supine to Sit;Sit to Supine     Supine to sit: Min assist Sit to supine: Min assist;+2 for physical assistance        Transfers Overall transfer level: Needs assistance Equipment used: 2 person hand held assist Transfers: Sit to/from Stand Sit to Stand: Mod assist;+2 physical assistance         General transfer comment: +2    Balance Overall balance assessment: History of Falls;Needs assistance Sitting-balance support: Single extremity supported;Feet supported Sitting balance-Leahy Scale: Fair     Standing balance support: No upper extremity supported Standing balance-Leahy Scale: Poor Standing balance comment: did better with +2 assis today but limited due to orthostatic                           ADL either performed or assessed with clinical judgement   ADL Overall ADL's : Needs assistance/impaired         Upper Body Bathing: Moderate assistance;Sitting       Upper Body Dressing : Moderate assistance;Maximal assistance Upper Body Dressing Details (indicate cue type and reason): MOD A doffing shirt, doffing/donning gown; MAX A for sling management                          Pertinent Vitals/Pain Pain Assessment: Faces Faces Pain Scale: Hurts little more Pain Location: RUE Pain Descriptors / Indicators: Constant Pain Intervention(s): Monitored during session;Repositioned;Limited activity within patient's tolerance  Hand Dominance Right   Extremity/Trunk Assessment Upper Extremity Assessment Upper Extremity Assessment: RUE deficits/detail RUE Deficits / Details: RUE in sling           Communication Communication Communication: No difficulties   Cognition Arousal/Alertness: Awake/alert Behavior During Therapy: WFL for tasks  assessed/performed Overall Cognitive Status: Within Functional Limits for tasks assessed                                                    Home Living Family/patient expects to be discharged to:: Private residence Living Arrangements: Children Available Help at Discharge: Family Type of Home: House Home Access: Stairs to enter Technical brewer of Steps: 8 Entrance Stairs-Rails: Can reach both Home Layout: One level               Home Equipment: Cane - single point          Prior Functioning/Environment Level of Independence: Independent with assistive device(s)                 OT Problem List: Decreased strength;Impaired balance (sitting and/or standing);Decreased activity tolerance;Decreased coordination;Impaired UE functional use      OT Treatment/Interventions: Self-care/ADL training;Therapeutic activities;DME and/or AE instruction;Balance training;Therapeutic exercise;Modalities;Patient/family education    OT Goals(Current goals can be found in the care plan section) Acute Rehab OT Goals Patient Stated Goal: to get better OT Goal Formulation: With patient Time For Goal Achievement: 01/11/21 Potential to Achieve Goals: Good  OT Frequency: Min 2X/week               Co-evaluation PT/OT/SLP Co-Evaluation/Treatment: Yes Reason for Co-Treatment: Complexity of the patient's impairments (multi-system involvement) PT goals addressed during session: Mobility/safety with mobility OT goals addressed during session: ADL's and self-care;Proper use of Adaptive equipment and DME      AM-PAC OT "6 Clicks" Daily Activity     Outcome Measure Help from another person eating meals?: A Little Help from another person taking care of personal grooming?: A Little Help from another person toileting, which includes using toliet, bedpan, or urinal?: A Lot Help from another person bathing (including washing, rinsing, drying)?: A Little Help from  another person to put on and taking off regular upper body clothing?: A Lot Help from another person to put on and taking off regular lower body clothing?: A Lot 6 Click Score: 15   End of Session Equipment Utilized During Treatment: Gait belt;Other (comment) (RUE sling) Nurse Communication: Mobility status  Activity Tolerance:  (pt is orthostatic, limiting progressing mobility) Patient left: in bed;with nursing/sitter in room;with family/visitor present;with call bell/phone within reach  OT Visit Diagnosis: Unsteadiness on feet (R26.81);Other abnormalities of gait and mobility (R26.89);Repeated falls (R29.6);Muscle weakness (generalized) (M62.81)                Time: 1030-1056 OT Time Calculation (min): 26 min Charges:  OT General Charges $OT Visit: 1 Visit OT Evaluation $OT Eval Moderate Complexity: 1 Mod  Shanon Payor, OTD OTR/L  12/28/20, 1:31 PM

## 2020-12-28 NOTE — Progress Notes (Signed)
PROGRESS NOTE    Matthew [name]  XNA:[id number] DOB: 02/04/1931 DOA: 12/25/2020 PCP: Jodi Marble, MD    Chief Complaint  Patient presents with   Fall   Shoulder Injury    Brief Narrative:   87 year old gentleman with prior history of type 2 diabetes mellitus, coronary artery disease, end-stage renal disease on dialysis, hypo-thyroidism, chronic systolic heart failure presents from home with a fall was found to have a right humerus fracture.  Orthopedics consulted on admission recommended conservative management with sling and outpatient follow-up in 1 week to 10 days.  Therapy evaluations recommending SNF. Assessment & Plan:   Principal Problem:   Proximal humerus fracture Active Problems:   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 [facility])   CAD (coronary artery disease)   Fall   Hypotension   Tremor   Hypothyroidism   Chronic systolic CHF (congestive heart failure) (HCC)   Proximal humerus fracture on the right Case discussed with orthopedic surgery recommended outpatient follow-up in 7 to 10 days.  They recommend a right arm immobilizer/sling Pain control and therapy evaluations recommending SNF.    Hypotension Appears to have resolved Continue with midodrine 10 mg 3 times daily.    Coronary artery disease s/p CABG  Patient currently denies any chest pain. Continue with Plavix Crestor and Zetia.     Hypothyroidism Continue with Synthroid.    Chronic systolic heart failure Further management as per dialysis.   Patient was on furosemide at home currently on hold due to hypotension.  If his blood pressure improves he can be  be discharged on Lasix on nondialysis days.     End-stage renal disease on dialysis with TTS Nephrology on board. .  Type 2 diabetes mellitus with end-stage renal disease Continue with sliding scale insulin at this time. CBG (last 3)  Recent Labs    12/27/20 2139 12/28/20 0751 12/28/20 1147  GLUCAP 216* 125*  144*  Last A!c is 6.3 % in 10/2020    Anemia of chronic disease Hemoglobin stable around 9 Transfuse to keep hemoglobin greater than 7.  Next   Secondary hyperparathyroidism Continue to monitor.   Mild thrombocytopenia Continue to monitor   DVT prophylaxis: Heparin Code Status: DNR Family Communication: None at bedside Disposition:   Status is: Inpatient  Remains inpatient appropriate because:Unsafe d/c plan  Dispo: The patient is from: Home              Anticipated d/c is to: SNF              Patient currently is medically stable to d/c.   Difficult to place patient No       Consultants:  Cardiology Nephrology.   Procedures: none.  Antimicrobials:  Antibiotics Given (last 72 hours)     None         Subjective: No new complaints.   Objective: Vitals:   12/28/20 0353 12/28/20 0750 12/28/20 0752 12/28/20 1057  BP: (!) 129/53 107/62  139/64  Pulse: 74 65 66 68  Resp: 16 16    Temp: 98.1 F (36.7 C) 98.2 F (36.8 C)    TempSrc: Oral Oral    SpO2: 98% 91% 97%   Weight:      Height:        Intake/Output Summary (Last 24 hours) at 12/28/2020 1430 Last data filed at 12/28/2020 0353 Gross per 24 hour  Intake 3 ml  Output 1250 ml  Net -1247 ml   Filed Weights   12/25/20  0627  Weight: 97.5 kg    Examination:  General exam: Appears calm and comfortable  Respiratory system: Clear to auscultation. Respiratory effort normal. Cardiovascular system: S1 & S2 heard, RRR. No JVD,  Gastrointestinal system: Abdomen is nondistended, soft and non tender Normal bowel sounds heard. Central nervous system: Alert and able to answer simple questions.  Extremities: Symmetric 5 x 5 power. Skin: No rashes, lesions or ulcers Psychiatry: Mood is appropriate.     Data Reviewed: I have personally reviewed following labs and imaging studies  CBC: Recent Labs  Lab 12/25/20 0831 12/28/20 0900  WBC 7.8 7.5  NEUTROABS 5.6  --   HGB 11.5* 9.5*  HCT 34.0* 27.9*   MCV 99.1 96.9  PLT 136* 133*    Basic Metabolic Panel: Recent Labs  Lab 12/25/20 0831 12/26/20 0501 12/28/20 0900  NA 135 134* 135  K 5.2* 4.6 4.4  CL 100 99 97*  CO2 28 29 29   GLUCOSE 129* 100* 142*  BUN 53* 37* 34*  CREATININE 3.45* 3.49* 3.29*  CALCIUM 9.3 8.6* 8.7*    GFR: Estimated Creatinine Clearance: 18.8 mL/min (A) (by C-G formula based on SCr of 3.29 mg/dL (H)).  Liver Function Tests: Recent Labs  Lab 12/25/20 0831  AST 35  ALT 34  ALKPHOS 94  BILITOT 0.9  PROT 6.6  ALBUMIN 3.4*    CBG: Recent Labs  Lab 12/27/20 1155 12/27/20 1719 12/27/20 2139 12/28/20 0751 12/28/20 1147  GLUCAP 139* 166* 216* 125* 144*     Recent Results (from the past 240 hour(s))  Resp Panel by RT-PCR (Flu A&B, Covid) Nasopharyngeal Swab     Status: None   Collection Time: 12/25/20  8:46 AM   Specimen: Nasopharyngeal Swab; Nasopharyngeal(NP) swabs in vial transport medium  Result Value Ref Range Status   SARS Coronavirus 2 by RT PCR NEGATIVE NEGATIVE Final    Comment: (NOTE) SARS-CoV-2 target nucleic acids are NOT DETECTED.  The SARS-CoV-2 RNA is generally detectable in upper respiratory specimens during the acute phase of infection. The lowest concentration of SARS-CoV-2 viral copies this assay can detect is 138 copies/mL. A negative result does not preclude SARS-Cov-2 infection and should not be used as the sole basis for treatment or other patient management decisions. A negative result may occur with  improper specimen collection/handling, submission of specimen other than nasopharyngeal swab, presence of viral mutation(s) within the areas targeted by this assay, and inadequate number of viral copies(<138 copies/mL). A negative result must be combined with clinical observations, patient history, and epidemiological information. The expected result is Negative.  Fact Sheet for Patients:  EntrepreneurPulse.com.au  Fact Sheet for Healthcare  Providers:  IncredibleEmployment.be  This test is no t yet approved or cleared by the [place] FDA and  has been authorized for detection and/or diagnosis of SARS-CoV-2 by FDA under an Emergency Use Authorization (EUA). This EUA will remain  in effect (meaning this test can be used) for the duration of the COVID-19 declaration under Section 564(b)(1) of the Act, 21 U.S.C.section 360bbb-3(b)(1), unless the authorization is terminated  or revoked sooner.       Influenza A by PCR NEGATIVE NEGATIVE Final   Influenza B by PCR NEGATIVE NEGATIVE Final    Comment: (NOTE) The Xpert Xpress SARS-CoV-2/FLU/RSV plus assay is intended as an aid in the diagnosis of influenza from Nasopharyngeal swab specimens and should not be used as a sole basis for treatment. Nasal washings and aspirates are unacceptable for Xpert Xpress SARS-CoV-2/FLU/RSV testing.  Fact Sheet  for Patients: EntrepreneurPulse.com.au  Fact Sheet for Healthcare Providers: IncredibleEmployment.be  This test is not yet approved or cleared by the [place] FDA and has been authorized for detection and/or diagnosis of SARS-CoV-2 by FDA under an Emergency Use Authorization (EUA). This EUA will remain in effect (meaning this test can be used) for the duration of the COVID-19 declaration under Section 564(b)(1) of the Act, 21 U.S.C. section 360bbb-3(b)(1), unless the authorization is terminated or revoked.  Performed at [facility], [address], [place], [place] [address]          Radiology Studies: CT HEAD WO CONTRAST (5MM)  Result Date: 12/26/2020 CLINICAL DATA:  Head trauma, minor (Age >= 65y); Neck trauma (Age >= 65y). Fall. EXAM: CT HEAD WITHOUT CONTRAST CT CERVICAL SPINE WITHOUT CONTRAST TECHNIQUE: Multidetector CT imaging of the head and cervical spine was performed following the standard protocol without intravenous contrast. Multiplanar CT  image reconstructions of the cervical spine were also generated. COMPARISON:  Head CT 02/15/2018.  Cervical spine CT 01/18/2018. FINDINGS: CT HEAD FINDINGS Brain: No acute infarct, intracranial hemorrhage, intra-axial mass, midline shift, or extra-axial fluid collection is identified. Hypodensities in the cerebral white matter are unchanged and nonspecific but compatible with mild chronic small vessel ischemic disease. A chronic lacunar infarct in the right centrum semiovale is unchanged. There is an unchanged arachnoid cyst in the right middle cranial fossa. Mild cerebral atrophy is within normal limits for age. Vascular: Calcified atherosclerosis at the skull base. No hyperdense vessel. Skull: No fracture or suspicious osseous lesion. Sinuses/Orbits: Small mucous retention cyst in the left maxillary sinus. Clear mastoid air cells. Bilateral cataract extraction. Other: None. CT CERVICAL SPINE FINDINGS Alignment: Chronic straightening of the normal cervical lordosis. Unchanged trace anterolisthesis of C7 on T1. Skull base and vertebrae: Interbody and posterior element ankylosis at C2-3 and C3-4. No acute fracture or suspicious osseous lesion. Soft tissues and spinal canal: No prevertebral fluid or swelling. No visible canal hematoma. Disc levels: Bulky anterior vertebral spurring from C4-C7 with associated disc space narrowing, moderate at C6-7. Asymmetrically severe right facet arthrosis at C7-T1. Moderate multilevel neural foraminal stenosis and suspected mild-to-moderate multilevel spinal stenosis. Upper chest: Clear lung apices. Other: Partially visualized right jugular catheter. Carotid atherosclerosis. IMPRESSION: 1. No evidence of acute intracranial abnormality. 2. Mild chronic small vessel ischemic disease. 3. Unchanged right middle cranial fossa arachnoid cyst. 4. No acute cervical spine fracture or traumatic malalignment. Electronically Signed   By: Logan Bores M.D.   On: 12/26/2020 15:16   CT CERVICAL  SPINE WO CONTRAST  Result Date: 12/26/2020 CLINICAL DATA:  Head trauma, minor (Age >= 65y); Neck trauma (Age >= 65y). Fall. EXAM: CT HEAD WITHOUT CONTRAST CT CERVICAL SPINE WITHOUT CONTRAST TECHNIQUE: Multidetector CT imaging of the head and cervical spine was performed following the standard protocol without intravenous contrast. Multiplanar CT image reconstructions of the cervical spine were also generated. COMPARISON:  Head CT 02/15/2018.  Cervical spine CT 01/18/2018. FINDINGS: CT HEAD FINDINGS Brain: No acute infarct, intracranial hemorrhage, intra-axial mass, midline shift, or extra-axial fluid collection is identified. Hypodensities in the cerebral white matter are unchanged and nonspecific but compatible with mild chronic small vessel ischemic disease. A chronic lacunar infarct in the right centrum semiovale is unchanged. There is an unchanged arachnoid cyst in the right middle cranial fossa. Mild cerebral atrophy is within normal limits for age. Vascular: Calcified atherosclerosis at the skull base. No hyperdense vessel. Skull: No fracture or suspicious osseous lesion. Sinuses/Orbits: Small mucous retention cyst  in the left maxillary sinus. Clear mastoid air cells. Bilateral cataract extraction. Other: None. CT CERVICAL SPINE FINDINGS Alignment: Chronic straightening of the normal cervical lordosis. Unchanged trace anterolisthesis of C7 on T1. Skull base and vertebrae: Interbody and posterior element ankylosis at C2-3 and C3-4. No acute fracture or suspicious osseous lesion. Soft tissues and spinal canal: No prevertebral fluid or swelling. No visible canal hematoma. Disc levels: Bulky anterior vertebral spurring from C4-C7 with associated disc space narrowing, moderate at C6-7. Asymmetrically severe right facet arthrosis at C7-T1. Moderate multilevel neural foraminal stenosis and suspected mild-to-moderate multilevel spinal stenosis. Upper chest: Clear lung apices. Other: Partially visualized right jugular  catheter. Carotid atherosclerosis. IMPRESSION: 1. No evidence of acute intracranial abnormality. 2. Mild chronic small vessel ischemic disease. 3. Unchanged right middle cranial fossa arachnoid cyst. 4. No acute cervical spine fracture or traumatic malalignment. Electronically Signed   By: Logan Bores M.D.   On: 12/26/2020 15:16   MR BRAIN WO CONTRAST  Result Date: 12/26/2020 CLINICAL DATA:  Acute neurologic deficit EXAM: MRI HEAD WITHOUT CONTRAST TECHNIQUE: Multiplanar, multiecho pulse sequences of the brain and surrounding structures were obtained without intravenous contrast. COMPARISON:  None. FINDINGS: Brain: No acute infarct, mass effect or extra-axial collection. No acute or chronic hemorrhage. There is multifocal hyperintense T2-weighted signal within the white matter. Generalized volume loss without a clear lobar predilection. Right middle cranial fossa arachnoid cyst. Old left cerebellar infarct. The midline structures are normal. Vascular: Major flow voids are preserved. Skull and upper cervical spine: Normal calvarium and skull base. Visualized upper cervical spine and soft tissues are normal. Sinuses/Orbits:No paranasal sinus fluid levels or advanced mucosal thickening. No mastoid or middle ear effusion. Normal orbits. IMPRESSION: 1. No acute intracranial abnormality. 2. Findings of chronic ischemic microangiopathy and generalized volume loss. Electronically Signed   By: Ulyses Jarred M.D.   On: 12/26/2020 21:25        Scheduled Meds:  amiodarone  200 mg Oral Daily   Chlorhexidine Gluconate Cloth  6 each Topical Q0600   cholecalciferol  1,000 Units Oral Q breakfast   clopidogrel  75 mg Oral Q breakfast   ezetimibe  10 mg Oral QAC breakfast   ferrous sulfate  325 mg Oral BID WC   heparin  5,000 Units Subcutaneous Q8H   insulin aspart  0-6 Units Subcutaneous TID WC   levothyroxine  75 mcg Oral QAC breakfast   loratadine  10 mg Oral q morning   metoprolol succinate  12.5 mg Oral QHS    midodrine  10 mg Oral QID   multivitamin  1 tablet Oral Daily   omega-3 acid ethyl esters  4,000 mg Oral Q lunch   polyethylene glycol  17 g Oral Daily   rosuvastatin  40 mg Oral QHS   senna-docusate  2 tablet Oral BID   sodium chloride flush  3 mL Intravenous Q12H   Continuous Infusions:  sodium chloride       LOS: 2 days        Hosie Poisson, MD [facility]   To contact the attending provider between 7A-7P or the covering provider during after hours 7P-7A, please log into the web site [email or web address] and access using universal [place] password for that web site. If you do not have the password, please call the hospital operator.  12/28/2020, 2:30 PM

## 2020-12-29 ENCOUNTER — Other Ambulatory Visit: Payer: Medicare Other

## 2020-12-29 LAB — GLUCOSE, CAPILLARY
Glucose-Capillary: 160 mg/dL — ABNORMAL HIGH (ref 70–99)
Glucose-Capillary: 132 mg/dL — ABNORMAL HIGH (ref 70–99)
Glucose-Capillary: 169 mg/dL — ABNORMAL HIGH (ref 70–99)
Glucose-Capillary: 154 mg/dL — ABNORMAL HIGH (ref 70–99)

## 2020-12-29 MED ORDER — HYDROCODONE-ACETAMINOPHEN 5-325 MG PO TABS
1.0000 | ORAL_TABLET | Freq: Four times a day (QID) | ORAL | Status: DC | PRN
  Administered 2020-12-30 – 2021-01-06 (×12): 1 via ORAL
  Filled 2020-12-29 (×12): qty 1

## 2020-12-29 MED ORDER — MIDODRINE HCL 5 MG PO TABS
5.0000 mg | ORAL_TABLET | Freq: Three times a day (TID) | ORAL | Status: DC
  Administered 2020-12-29: 5 mg via ORAL
  Filled 2020-12-29: qty 1

## 2020-12-29 NOTE — Progress Notes (Signed)
SUBJECTIVE: Matthew [name] is a 87 y.o. male with medical history significant for diabetes mellitus with complications of end-stage renal disease on hemodialysis (dialysis days are Tuesday/Thursday/Saturday), coronary artery disease status post CABG, hypertension who presents to the ER for evaluation after he fell this morning while getting ready to go to dialysis. Patient states that he leaned over to pick up a sock when he lost his balance and fell landing on his right shoulder.  He denied feeling dizzy or lightheaded and did not hit his head.  There was no loss of consciousness. He denies having any chest pain, no shortness of breath, no nausea, no vomiting, no dizziness, no lightheadedness, no headache, no abdominal pain, no changes in his bowel habits, no fever, no chills, no cough, no urinary symptoms, no palpitations or diaphoresis.     Vitals:   12/28/20 2049 12/28/20 2139 12/29/20 0425 12/29/20 0830  BP: (!) 144/64  139/63 (!) 139/58  Pulse: 64 66 68 71  Resp: 20  20 16   Temp: 98.1 F (36.7 C)  98.2 F (36.8 C) 98.9 F (37.2 C)  TempSrc:   Oral Oral  SpO2: 97% 100% 100% 93%  Weight:      Height:        Intake/Output Summary (Last 24 hours) at 12/29/2020 0831 Last data filed at 12/29/2020 0429 Gross per 24 hour  Intake --  Output 675 ml  Net -675 ml    LABS: Basic Metabolic Panel: Recent Labs    12/28/20 0900  NA 135  K 4.4  CL 97*  CO2 29  GLUCOSE 142*  BUN 34*  CREATININE 3.29*  CALCIUM 8.7*   Liver Function Tests: No results for input(s): AST, ALT, ALKPHOS, BILITOT, PROT, ALBUMIN in the last 72 hours. No results for input(s): LIPASE, AMYLASE in the last 72 hours. CBC: Recent Labs    12/28/20 0900  WBC 7.5  HGB 9.5*  HCT 27.9*  MCV 96.9  PLT 133*   Cardiac Enzymes: No results for input(s): CKTOTAL, CKMB, CKMBINDEX, TROPONINI in the last 72 hours. BNP: Invalid input(s): POCBNP D-Dimer: No results for input(s): DDIMER in the last 72 hours. Hemoglobin  A1C: No results for input(s): HGBA1C in the last 72 hours. Fasting Lipid Panel: No results for input(s): CHOL, HDL, LDLCALC, TRIG, CHOLHDL, LDLDIRECT in the last 72 hours. Thyroid Function Tests: No results for input(s): TSH, T4TOTAL, T3FREE, THYROIDAB in the last 72 hours.  Invalid input(s): FREET3 Anemia Panel: No results for input(s): VITAMINB12, FOLATE, FERRITIN, TIBC, IRON, RETICCTPCT in the last 72 hours.   PHYSICAL EXAM General: Well developed, well nourished, in no acute distress HEENT:  Normocephalic and atramatic Neck:  No JVD.  Lungs: Clear bilaterally to auscultation and percussion. Heart: HRRR . Normal S1 and S2 without gallops or murmurs.  Abdomen: Bowel sounds are positive, abdomen soft and non-tender  Msk:  Back normal, normal gait. Normal strength and tone for age. Extremities: No clubbing, cyanosis or edema.   Neuro: Alert and oriented X 3. Psych:  Good affect, responds appropriately   ASSESSMENT AND PLAN: Patient feeling well. Blood pressure improved on fluids and midodrine. Advise continuing midodrine. Follow up in office  01/05/21 at 10:00.  Principal Problem:   Proximal humerus fracture Active Problems:   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 [facility])   CAD (coronary artery disease)   Fall   Hypotension   Tremor   Hypothyroidism   Chronic systolic CHF (congestive heart failure) ([place])    Darshawn Boateng  Faysal Fenoglio, FNP-C 12/29/2020 8:31 AM

## 2020-12-29 NOTE — TOC Progression Note (Addendum)
Transition of Care [facility]) - Progression Note    Patient Details  Name: Matthew [name] MRN: [id number] Date of Birth: 09/15/1930  Transition of Care [facility]) CM/SW [place], LCSW Phone Number: 12/29/2020, 9:27 AM  Clinical Narrative:  [facility] unable to offer a bed due to HD and bed availability. [facility] has declined bed offer. Left message for [facility] admissions coordinator asking her to review referral.   11:36 am: Called daughter to provide update. She is open to out of county facility if needed. Made her aware that he would have to switch HD center if going out of county which she is agreeable to as well. [facility] is unable to accept anymore HD patients until they hire a new driver. Expanded search to surrounding counties. Left message for HD coordinator to provide update.  1:51 pm: [facility] can offer a bed if patient can get an HD chair MWF 2nd shift at [facility] in [place]. [facility] can offer a bed if patient can get a chair time at an HD center within 10-15 minutes maximum from the facility. They typically transport to [employer] [facility], [place], [place], and [facility]	[place]. [facility] uses [facility] or [employer] to get patients to dialysis. It costs around $2.50 per ride but the scheduler would have to call the company and give them all details of where to pick up, drop off and how long they would need to do transportation just to get a price. [place] will find out where they transport and call back. Left voicemails for admissions coordinators at [facility] in [place], [facility] in [place], and [facility] in [place]. Updating HD coordinator as more information becomes available.  2:04 pm: [place] said no particular schedule, just has to be either [place], [place], or the HD center on [address] in [place]. [facility] can transport to [facility] on [address] in [place] and  [facility] in [place]. They prefer MWF schedule but can do TTS if needed.  3:41 pm: Called daughter and provided update. Emailed her list of SNF's that had made bed offers including details on HD. Also included facilities that were still pending.  Expected Discharge Plan: [place] Barriers to Discharge: Continued Medical Work up  Expected Discharge Plan and Services Expected Discharge Plan: [place] Choice: [place] arrangements for the past 2 months: Single Family Home                                       Social Determinants of Health (SDOH) Interventions    Readmission Risk Interventions No flowsheet data found.

## 2020-12-29 NOTE — Progress Notes (Signed)
Physical Therapy Treatment Patient Details Name: Matthew [name] MRN: [id number] DOB: 05-31-1930 Today's Date: 12/29/2020    History of Present Illness Matthew [name] is a 87 y.o. male with medical history significant for diabetes mellitus with complications of end-stage renal disease on hemodialysis (dialysis days are Tuesday/Thursday/Saturday), coronary artery disease status post CABG, hypertension who presents to the ER for evaluation after he fell this morning while getting ready to go to dialysis.  Patient states that he leaned over to pick up a sock when he lost his balance and fell landing on his right shoulder.  He denied feeling dizzy or lightheaded and did not hit his head.  There was no loss of consciousness.    PT Comments    Pt ready for session.  Vitals taken and pt with stable BP today.  See OT note and flow sheets for details but BP's did not limit mobility today.  OOB with min a x 1.  Steady in sitting.  Is able to stand with min a x 2 and transfer to recliner.  Standing marching in place and SLR with cues to decrease ROM for balance.  Poor awareness.  After seated rest, he is able to walk to/from bathroom with min a x 2 for voiding and BM.  OT assisted and encouraged ADL tasks.  Returned to chair after session and remained sitting.  Discussed mobility with tech and encouraged +2 assist.  Pt only tolerated about 30 minutes in chair before fatigue and requesting to return to bed with nursing.      Follow Up Recommendations  SNF     Equipment Recommendations  Cane    Recommendations for Other Services       Precautions / Restrictions Precautions Precautions: Fall;Other (comment) Type of Shoulder Precautions: R humerus fx- per MD note keep RUE immobilized Required Braces or Orthoses: Sling Restrictions Weight Bearing Restrictions: Yes RUE Weight Bearing: Non weight bearing Other Position/Activity Restrictions: in sling immobilized    Mobility  Bed Mobility Overal bed  mobility: Needs Assistance Bed Mobility: Supine to Sit;Sit to Supine     Supine to sit: Min assist Sit to supine: Min assist        Transfers Overall transfer level: Needs assistance Equipment used: 2 person hand held assist Transfers: Sit to/from [facility] Sit to Stand: Min assist;+2 physical assistance Stand pivot transfers: Min assist;+2 physical assistance          Ambulation/Gait Ambulation/Gait assistance: Min assist;+2 physical assistance Gait Distance (Feet): 25 Feet Assistive device: 2 person hand held assist   Gait velocity: decreased   General Gait Details: to/from bathroom to void and BM   Stairs             Wheelchair Mobility    Modified Rankin (Stroke Patients Only)       Balance Overall balance assessment: History of Falls;Needs assistance Sitting-balance support: Single extremity supported;Feet supported Sitting balance-Leahy Scale: Good     Standing balance support: No upper extremity supported Standing balance-Leahy Scale: Poor                              Cognition Arousal/Alertness: Awake/alert Behavior During Therapy: WFL for tasks assessed/performed Overall Cognitive Status: Within Functional Limits for tasks assessed  Exercises Other Exercises Other Exercises: standing marching in place and SLR with Min a x 2 for balance.    General Comments        Pertinent Vitals/Pain Pain Assessment: No/denies pain    Home Living                      Prior Function            PT Goals (current goals can now be found in the care plan section) Acute Rehab PT Goals Patient Stated Goal: to go to the bathroom Progress towards PT goals: Progressing toward goals    Frequency    7X/week      PT Plan Current plan remains appropriate    Co-evaluation PT/OT/SLP Co-Evaluation/Treatment: Yes Reason for Co-Treatment: Complexity of  the patient's impairments (multi-system involvement) PT goals addressed during session: Mobility/safety with mobility OT goals addressed during session: ADL's and self-care      AM-PAC PT "6 Clicks" Mobility   Outcome Measure  Help needed turning from your back to your side while in a flat bed without using bedrails?: A Little Help needed moving from lying on your back to sitting on the side of a flat bed without using bedrails?: A Lot Help needed moving to and from a bed to a chair (including a wheelchair)?: A Lot Help needed standing up from a chair using your arms (e.g., wheelchair or bedside chair)?: A Lot Help needed to walk in hospital room?: A Lot Help needed climbing 3-5 steps with a railing? : Total 6 Click Score: 12    End of Session Equipment Utilized During Treatment: Gait belt Activity Tolerance: Patient limited by pain;Treatment limited secondary to medical complications (Comment) Patient left: in bed;with call bell/phone within reach;with bed alarm set;with family/visitor present;with nursing/sitter in room Nurse Communication: Mobility status;Weight bearing status;Precautions PT Visit Diagnosis: Unsteadiness on feet (R26.81);Repeated falls (R29.6);Muscle weakness (generalized) (M62.81);History of falling (Z91.81);Difficulty in walking, not elsewhere classified (R26.2)     Time: 1093-2355 PT Time Calculation (min) (ACUTE ONLY): 31 min  Charges:  $Gait Training: 8-22 mins                    [name], PTA 12/29/20, 3:39 PM , 3:35 PM

## 2020-12-29 NOTE — Progress Notes (Signed)
   12/27/20 1530 12/27/20 1545 12/27/20 1600  Vitals  BP (!) 99/52 (!) 101/53 (!) 87/52  MAP (mmHg) 66 68 (!) 64  Pulse Rate 69 68 78  ECG Heart Rate 65 69 71  Resp 10 20 19   During Hemodialysis Assessment  Blood Flow Rate (mL/min) 400 mL/min 400 mL/min 400 mL/min  Arterial Pressure (mmHg) -150 mmHg -150 mmHg -150 mmHg  Venous Pressure (mmHg) 130 mmHg 130 mmHg 130 mmHg  Transmembrane Pressure (mmHg) 50 mmHg 50 mmHg 50 mmHg  Ultrafiltration Rate (mL/min) 500 mL/min 500 mL/min 500 mL/min  Dialysate Flow Rate (mL/min) 500 ml/min 500 ml/min 500 ml/min  Conductivity: Machine  13.5 13.5 13.5  HD Safety Checks Performed Yes Yes Yes  Intra-Hemodialysis Comments Tolerated well Progressing as prescribed Tolerated well    12/27/20 1615 12/27/20 1630 12/27/20 1645  Vitals  BP (!) 94/50 105/67 102/60  MAP (mmHg) 65 80 73  Pulse Rate 68 (!) 106 77  ECG Heart Rate 93 (!) 111 79  Resp 16 14 17   During Hemodialysis Assessment  Blood Flow Rate (mL/min) 400 mL/min 400 mL/min  --   Arterial Pressure (mmHg) -150 mmHg -150 mmHg  --   Venous Pressure (mmHg) 130 mmHg 130 mmHg  --   Transmembrane Pressure (mmHg) 50 mmHg 50 mmHg  --   Ultrafiltration Rate (mL/min) 500 mL/min 500 mL/min  --   Dialysate Flow Rate (mL/min) 500 ml/min 500 ml/min  --   Conductivity: Machine  13.5 13.5  --   HD Safety Checks Performed Yes Yes  --   Intra-Hemodialysis Comments Progressing as prescribed Tolerated well Tx completed (1646)

## 2020-12-29 NOTE — Care Management Important Message (Signed)
Important Message  Patient Details  Name: Matthew [name] MRN: [id number] Date of Birth: 06-07-1930   Medicare Important Message Given:  Yes     Juliann Pulse A Zariana Strub 12/29/2020, 12:37 PM

## 2020-12-29 NOTE — Progress Notes (Signed)
   12/27/20 1345 12/27/20 1400 12/27/20 1415  Vitals  Temp 98 F (36.7 C)  --   --   Temp Source Oral  --   --   BP (!) 113/59 (!) 109/56 (!) 101/50  MAP (mmHg) 75 72 65  BP Location Left Arm  --   --   BP Method Automatic  --   --   Pulse Rate 73 72 68  ECG Heart Rate 75 73 70  Resp 16 16 17   During Hemodialysis Assessment  Blood Flow Rate (mL/min) 400 mL/min 400 mL/min 400 mL/min  Arterial Pressure (mmHg) -150 mmHg -150 mmHg -150 mmHg  Venous Pressure (mmHg) 130 mmHg 130 mmHg 130 mmHg  Transmembrane Pressure (mmHg) 50 mmHg 50 mmHg 50 mmHg  Ultrafiltration Rate (mL/min) 500 mL/min 500 mL/min 500 mL/min  Dialysate Flow Rate (mL/min) 500 ml/min 500 ml/min 500 ml/min  Conductivity: Machine  13.5 13.5 13.5  HD Safety Checks Performed Yes Yes Yes  Dialysis Fluid Bolus Normal Saline  --   --   Bolus Amount (mL) 200 mL  --   --   Intra-Hemodialysis Comments Tx initiated Progressing as prescribed Progressing as prescribed    12/27/20 1430 12/27/20 1445 12/27/20 1500  Vitals  Temp  --   --   --   Temp Source  --   --   --   BP (!) 103/51 (!) 99/51 (!) 94/49  MAP (mmHg) 67 66 (!) 63  BP Location  --   --   --   BP Method  --   --   --   Pulse Rate 70 68 74  ECG Heart Rate 70 63 74  Resp 16 17 17   During Hemodialysis Assessment  Blood Flow Rate (mL/min) 400 mL/min 400 mL/min 400 mL/min  Arterial Pressure (mmHg) -150 mmHg -150 mmHg -150 mmHg  Venous Pressure (mmHg) 130 mmHg 130 mmHg 130 mmHg  Transmembrane Pressure (mmHg) 50 mmHg 50 mmHg 50 mmHg  Ultrafiltration Rate (mL/min) 500 mL/min 500 mL/min 500 mL/min  Dialysate Flow Rate (mL/min) 500 ml/min 500 ml/min 500 ml/min  Conductivity: Machine  13.5 13.5 13.5  HD Safety Checks Performed Yes Yes Yes  Dialysis Fluid Bolus  --   --   --   Bolus Amount (mL)  --   --   --   Intra-Hemodialysis Comments Progressing as prescribed Progressing as prescribed Progressing as prescribed    12/27/20 1515  Vitals  Temp  --   Temp Source  --    BP (!) 96/54  MAP (mmHg) 67  BP Location  --   BP Method  --   Pulse Rate 74  ECG Heart Rate 84  Resp 20  During Hemodialysis Assessment  Blood Flow Rate (mL/min) 400 mL/min  Arterial Pressure (mmHg) -150 mmHg  Venous Pressure (mmHg) 130 mmHg  Transmembrane Pressure (mmHg) 50 mmHg  Ultrafiltration Rate (mL/min) 500 mL/min  Dialysate Flow Rate (mL/min) 500 ml/min  Conductivity: Machine  13.5  HD Safety Checks Performed Yes  Dialysis Fluid Bolus  --   Bolus Amount (mL)  --   Intra-Hemodialysis Comments Progressing as prescribed

## 2020-12-29 NOTE — Progress Notes (Signed)
[facility] NAME: Matthew [name]    MR#:  [id number]  DATE OF BIRTH:  November 09, 1930  SUBJECTIVE:   patient denies any complaints. Complains of right shoulder hurting on movement. Has a right shoulder sling for fracture humerus. Daughter at bedside. Anxious to know if he has any bed offers. REVIEW OF SYSTEMS:   Review of Systems  Constitutional:  Negative for chills, fever and weight loss.  HENT:  Negative for ear discharge, ear pain and nosebleeds.   Eyes:  Negative for blurred vision, pain and discharge.  Respiratory:  Negative for sputum production, shortness of breath, wheezing and stridor.   Cardiovascular:  Negative for chest pain, palpitations, orthopnea and PND.  Gastrointestinal:  Negative for abdominal pain, diarrhea, nausea and vomiting.  Genitourinary:  Negative for frequency and urgency.  Musculoskeletal:  Positive for joint pain. Negative for back pain.  Neurological:  Positive for weakness. Negative for sensory change, speech change and focal weakness.  Psychiatric/Behavioral:  Negative for depression and hallucinations. The patient is not nervous/anxious.   Tolerating Diet:yes Tolerating PT: reahb  DRUG ALLERGIES:   Allergies  Allergen Reactions   Other     Other reaction(s): Myalgias (intolerance), Other (See Comments) Statin Drugs  Statin Drugs     Amoxicillin Diarrhea    VITALS:  Blood pressure (!) 139/58, pulse 71, temperature 98.9 F (37.2 C), temperature source Oral, resp. rate 16, height 6' (1.829 m), weight 97.5 kg, SpO2 93 %.  PHYSICAL EXAMINATION:   Physical Exam  GENERAL:  87 y.o.-year-old patient lying in the bed with no acute distress.  LUNGS: Normal breath sounds bilaterally, no wheezing, rales, rhonchi. No use of accessory muscles of respiration.  CARDIOVASCULAR: S1, S2 normal. No murmurs, rubs, or gallops.  ABDOMEN: Soft, nontender, nondistended. Bowel sounds present. No organomegaly or mass.   EXTREMITIES: No cyanosis, clubbing or edema b/l.  Right arm sling+  NEUROLOGIC: Cranial nerves II through XII are intact. No focal Motor or sensory deficits b/l.   PSYCHIATRIC:  patient is alert and oriented x 3.  SKIN: No obvious rash, lesion, or ulcer.   LABORATORY PANEL:  CBC Recent Labs  Lab 12/28/20 0900  WBC 7.5  HGB 9.5*  HCT 27.9*  PLT 133*    Chemistries  Recent Labs  Lab 12/25/20 0831 12/26/20 0501 12/28/20 0900  NA 135   < > 135  K 5.2*   < > 4.4  CL 100   < > 97*  CO2 28   < > 29  GLUCOSE 129*   < > 142*  BUN 53*   < > 34*  CREATININE 3.45*   < > 3.29*  CALCIUM 9.3   < > 8.7*  AST 35  --   --   ALT 34  --   --   ALKPHOS 94  --   --   BILITOT 0.9  --   --    < > = values in this interval not displayed.   Cardiac Enzymes No results for input(s): TROPONINI in the last 168 hours. RADIOLOGY:  No results found. ASSESSMENT AND PLAN:  87 year old gentleman with prior history of type 2 diabetes mellitus, coronary artery disease, end-stage renal disease on dialysis, hypo-thyroidism, chronic systolic heart failure presents from home with a fall was found to have a right humerus fracture.  Orthopedics consulted on admission recommended conservative management with sling and outpatient follow-up in 1 week to 10 days  Proximal humerus fracture on  the right --Case discussed with orthopedic surgery  ( I believe that would be Dr Harlow Mares on call on aug 4th--no ortho MD name documented by previous MD's)recommended outpatient follow-up in 7 to 10 days.  -- recommend a right arm immobilizer/sling --Pain control and therapy evaluations recommending SNF.    Hypotension Appears to have resolved Taper midodrine to off   Coronary artery disease s/p CABG --Patient currently denies any chest pain. --Continue with Plavix Crestor and Zetia.   Hypothyroidism --Continue with Synthroid.   Chronic systolic heart failure --Further management as per dialysis.   --Patient was on  furosemide at home currently on hold due to hypotension.  If his blood pressure improves he can be  be discharged on Lasix on nondialysis days.   End-stage renal disease on dialysis with TTS Nephrology on board. . Type 2 diabetes mellitus with end-stage renal disease--Continue with sliding scale insulin at this time. --Last A!c is 6.3 % in 10/2020    Anemia of chronic disease --Hemoglobin stable around 9 --Transfuse to keep hemoglobin greater than 7.       Mild thrombocytopenia Continue to monitor--appears stable     DVT prophylaxis: Heparin Code Status: DNR Family Communication: dter at bedside Disposition: to rehab once bed available   Status is: Inpatient     Dispo: The patient is from: Home              Anticipated d/c is to: SNF              Patient currently is medically stable to d/c.              Difficult to place patient No                  TOTAL TIME TAKING CARE OF THIS PATIENT: 24 minutes.  >50% time spent on counselling and coordination of care  Note: This dictation was prepared with Dragon dictation along with smaller phrase technology. Any transcriptional errors that result from this process are unintentional.  Fritzi Mandes M.D    [facility]   CC: Primary care physician; Jodi Marble, MD Patient ID: Matthew [name], male   DOB: Aug 08, 1930, 87 y.o.   MRN: [id number]

## 2020-12-29 NOTE — Progress Notes (Signed)
[facility]  ROUNDING NOTE   Subjective:   [name] was admitted to [facility] on 12/25/2020 for Fall [W19.XXXA] Closed fracture of proximal end of right humerus, unspecified fracture morphology, initial encounter [S42.201A] Hypotension [I95.9]  Patient seen resting in bed Wife at bedside Tolerating meals Denies shortness of breath Awaiting rehab placement   Objective:  Vital signs in last 24 hours:  Temp:  [98.1 F (36.7 C)-98.9 F (37.2 C)] 98.9 F (37.2 C) (08/08 0830) Pulse Rate:  [64-71] 71 (08/08 0830) Resp:  [16-20] 16 (08/08 0830) BP: (139-144)/(58-64) 139/58 (08/08 0830) SpO2:  [93 %-100 %] 93 % (08/08 0830)  Weight change:  Filed Weights   12/25/20 0627  Weight: 97.5 kg    Intake/Output: I/O last 3 completed shifts: In: 3 [I.V.:3] Out: 925 [Urine:925]   Intake/Output this shift:  Total I/O In: 240 [P.O.:240] Out: 150 [Urine:150]  Physical Exam: General: NAD, laying in bed  Head: Normocephalic, atraumatic. Moist oral mucosal membranes  Eyes: Anicteric  Lungs:  Clear to auscultation,normal effort  Heart: Regular rate and rhythm  Abdomen:  Soft, nontender  Extremities:  no peripheral edema.  Neurologic: Nonfocal, moving all four extremities  Skin: No lesions  Access: RIJ permcath    Basic Metabolic Panel: Recent Labs  Lab 12/25/20 0831 12/26/20 0501 12/28/20 0900  NA 135 134* 135  K 5.2* 4.6 4.4  CL 100 99 97*  CO2 28 29 29   GLUCOSE 129* 100* 142*  BUN 53* 37* 34*  CREATININE 3.45* 3.49* 3.29*  CALCIUM 9.3 8.6* 8.7*     Liver Function Tests: Recent Labs  Lab 12/25/20 0831  AST 35  ALT 34  ALKPHOS 94  BILITOT 0.9  PROT 6.6  ALBUMIN 3.4*    No results for input(s): LIPASE, AMYLASE in the last 168 hours. No results for input(s): AMMONIA in the last 168 hours.  CBC: Recent Labs  Lab 12/25/20 0831 12/28/20 0900  WBC 7.8 7.5  NEUTROABS 5.6  --   HGB 11.5* 9.5*  HCT 34.0* 27.9*  MCV 99.1 96.9  PLT 136*  133*     Cardiac Enzymes: No results for input(s): CKTOTAL, CKMB, CKMBINDEX, TROPONINI in the last 168 hours.  BNP: Invalid input(s): POCBNP  CBG: Recent Labs  Lab 12/28/20 1147 12/28/20 1656 12/28/20 2100 12/29/20 0832 12/29/20 1203  GLUCAP 144* 163* 184* 160* 132*     Microbiology: Results for orders placed or performed during the hospital encounter of 12/25/20  Resp Panel by RT-PCR (Flu A&B, Covid) Nasopharyngeal Swab     Status: None   Collection Time: 12/25/20  8:46 AM   Specimen: Nasopharyngeal Swab; Nasopharyngeal(NP) swabs in vial transport medium  Result Value Ref Range Status   SARS Coronavirus 2 by RT PCR NEGATIVE NEGATIVE Final    Comment: (NOTE) SARS-CoV-2 target nucleic acids are NOT DETECTED.  The SARS-CoV-2 RNA is generally detectable in upper respiratory specimens during the acute phase of infection. The lowest concentration of SARS-CoV-2 viral copies this assay can detect is 138 copies/mL. A negative result does not preclude SARS-Cov-2 infection and should not be used as the sole basis for treatment or other patient management decisions. A negative result may occur with  improper specimen collection/handling, submission of specimen other than nasopharyngeal swab, presence of viral mutation(s) within the areas targeted by this assay, and inadequate number of viral copies(<138 copies/mL). A negative result must be combined with clinical observations, patient history, and epidemiological information. The expected result is Negative.  Fact Sheet  for Patients:  EntrepreneurPulse.com.au  Fact Sheet for Healthcare Providers:  IncredibleEmployment.be  This test is no t yet approved or cleared by the [place] FDA and  has been authorized for detection and/or diagnosis of SARS-CoV-2 by FDA under an Emergency Use Authorization (EUA). This EUA will remain  in effect (meaning this test can be used) for the duration of  the COVID-19 declaration under Section 564(b)(1) of the Act, 21 U.S.C.section 360bbb-3(b)(1), unless the authorization is terminated  or revoked sooner.       Influenza A by PCR NEGATIVE NEGATIVE Final   Influenza B by PCR NEGATIVE NEGATIVE Final    Comment: (NOTE) The Xpert Xpress SARS-CoV-2/FLU/RSV plus assay is intended as an aid in the diagnosis of influenza from Nasopharyngeal swab specimens and should not be used as a sole basis for treatment. Nasal washings and aspirates are unacceptable for Xpert Xpress SARS-CoV-2/FLU/RSV testing.  Fact Sheet for Patients: EntrepreneurPulse.com.au  Fact Sheet for Healthcare Providers: IncredibleEmployment.be  This test is not yet approved or cleared by the [place] FDA and has been authorized for detection and/or diagnosis of SARS-CoV-2 by FDA under an Emergency Use Authorization (EUA). This EUA will remain in effect (meaning this test can be used) for the duration of the COVID-19 declaration under Section 564(b)(1) of the Act, 21 U.S.C. section 360bbb-3(b)(1), unless the authorization is terminated or revoked.  Performed at [facility], [address] [place], [place] [address]     Coagulation Studies: No results for input(s): LABPROT, INR in the last 72 hours.  Urinalysis: No results for input(s): COLORURINE, LABSPEC, PHURINE, GLUCOSEU, HGBUR, BILIRUBINUR, KETONESUR, PROTEINUR, UROBILINOGEN, NITRITE, LEUKOCYTESUR in the last 72 hours.  Invalid input(s): APPERANCEUR    Imaging: No results found.   Medications:    sodium chloride      amiodarone  200 mg Oral Daily   Chlorhexidine Gluconate Cloth  6 each Topical Q0600   cholecalciferol  1,000 Units Oral Q breakfast   clopidogrel  75 mg Oral Q breakfast   ezetimibe  10 mg Oral QAC breakfast   ferrous sulfate  325 mg Oral BID WC   heparin  5,000 Units Subcutaneous Q8H   insulin aspart  0-6 Units Subcutaneous TID WC    levothyroxine  75 mcg Oral QAC breakfast   loratadine  10 mg Oral q morning   metoprolol succinate  12.5 mg Oral QHS   multivitamin  1 tablet Oral Daily   omega-3 acid ethyl esters  4,000 mg Oral Q lunch   polyethylene glycol  17 g Oral Daily   rosuvastatin  40 mg Oral QHS   senna-docusate  2 tablet Oral BID   sodium chloride flush  3 mL Intravenous Q12H   sodium chloride, bisacodyl, fluticasone, HYDROcodone-acetaminophen, nitroGLYCERIN, ondansetron **OR** ondansetron (ZOFRAN) IV, sodium chloride flush, traMADol  Assessment/ Plan:  [name] is a 87 y.o. white male with end stage renal disease on hemodialysis, hypertension, atrial fibrillation, coronary artery disease status post CABG, diabetes mellitus type II, hypothyroidism, who is admitted to [facility] on 12/25/2020 for Fall [W19.XXXA] Closed fracture of proximal end of right humerus, unspecified fracture morphology, initial encounter [S42.201A] Hypotension [I95.9] Found to have a right humerus fracture.   [facility] TTS [address] RIJ permcath 98.5kg  End Stage Renal Disease: TTS, Next treatment scheduled for Tuesday. Will monitor discharge plan for adjustments needed to outpatient dialysis needs.  Hypotension:  Home regimen of midodrine before dialysis and for hypertension he takes metoprolol and furosemide. Furosemide  used for volume control and may be transitioned to non-dialysis days. Midodrine increased to QID. BP 139/58  Anemia of chronic kidney disease: hemoglobin at goal 9.5. Will monitor   Secondary Hyperparathyroidism: outpatient PTH on 7/12 was low at 134. Not currently on binders.  Phosphorus low Hold vitamin D agents.     LOS: 3   8/8/20221:13 PM

## 2020-12-29 NOTE — Progress Notes (Signed)
Occupational Therapy Treatment Patient Details Name: Matthew [name] MRN: [id number] DOB: 01-24-1931 Today's Date: 12/29/2020    History of present illness Matthew [name] is a 87 y.o. male with medical history significant for diabetes mellitus with complications of end-stage renal disease on hemodialysis (dialysis days are Tuesday/Thursday/Saturday), coronary artery disease status post CABG, hypertension who presents to the ER for evaluation after he fell this morning while getting ready to go to dialysis.  Patient states that he leaned over to pick up a sock when he lost his balance and fell landing on his right shoulder.  He denied feeling dizzy or lightheaded and did not hit his head.  There was no loss of consciousness.   OT comments  Chart reviewed, pt greeted in bed with daughter present. Pt is A&Ox4, agreeable to skilled OT tx sesion. Pt is a cotx with PTA due to orthostatics and mobility requiring +2 at evaluation. Pt with significant improvements in bed mobility, functional ambulation, ADL completion on this date as compared to evaluation however did require +2 for safe functional ambulation. Please see additional details below. VSS remain stable throughout with BP in supine 152/65, sitting at EOB 157/88, standing 155/81. Pt continues to perform ADL tasks below PLOF therefore skilled OT continues to be recommended to address functional deficits and to facilitate safe ADL completion. Daughter and pt educated on findings, all questions answered. Pt is left in bedside chair, NAD, all needs met +chair alarm. NT aware of pt status. OT will continue to follow while admitted.    Follow Up Recommendations  SNF    Equipment Recommendations  Other (comment) (as per next venue of care)    Recommendations for Other Services      Precautions / Restrictions Precautions Precautions: Fall;Other (comment) Type of Shoulder Precautions: R humerus fx- per MD note keep RUE immobilized Required Braces or  Orthoses: Sling       Mobility Bed Mobility Overal bed mobility: Needs Assistance Bed Mobility: Supine to Sit;Sit to Supine     Supine to sit: Min assist Sit to supine: Min assist        Transfers Overall transfer level: Needs assistance Equipment used: 2 person hand held assist Transfers: Sit to/from [facility] Sit to Stand: Min assist Stand pivot transfers: Min assist            Balance Overall balance assessment: History of Falls;Needs assistance Sitting-balance support: Single extremity supported;Feet supported Sitting balance-Leahy Scale: Good     Standing balance support: No upper extremity supported Standing balance-Leahy Scale: Fair                             ADL either performed or assessed with clinical judgement   ADL Overall ADL's : Needs assistance/impaired     Grooming: Wash/dry hands;Sitting;Maximal assistance Grooming Details (indicate cue type and reason): due to R hand immobilization             Lower Body Dressing: Minimal assistance;Maximal assistance Lower Body Dressing Details (indicate cue type and reason): MIN A for doffing socks, MAX A for donning socks; Pt has reacher at home- fell at home due to dropping sock while getting dressed Toilet Transfer: Minimal assistance;+2 for safety/equipment;BSC Toilet Transfer Details (indicate cue type and reason): with gait belt Toileting- Clothing Manipulation and Hygiene: +2 for safety/equipment;Moderate assistance Toileting - Clothing Manipulation Details (indicate cue type and reason): MOD A for thoroughness for peri care  Functional mobility during ADLs: Minimal assistance;+2 for physical assistance General ADL Comments: Daugther reports pt can self feed with non-dominant L hand with set up                       Cognition Arousal/Alertness: Awake/alert Behavior During Therapy: [facility] for tasks assessed/performed Overall Cognitive Status: Within  Functional Limits for tasks assessed                                           Progress Toward Goals  OT Goals(current goals can now be found in the care plan section)  Progress towards OT goals: Progressing toward goals  Acute Rehab OT Goals Patient Stated Goal: to go to the bathroom OT Goal Formulation: With patient Time For Goal Achievement: 01/12/21 Potential to Achieve Goals: Good  Plan Discharge plan remains appropriate;Frequency remains appropriate    Co-evaluation    PT/OT/SLP Co-Evaluation/Treatment: Yes Reason for Co-Treatment: Complexity of the patient's impairments (multi-system involvement)   OT goals addressed during session: ADL's and self-care;Proper use of Adaptive equipment and DME      AM-PAC OT "6 Clicks" Daily Activity     Outcome Measure   Help from another person eating meals?: A Little Help from another person taking care of personal grooming?: A Little Help from another person toileting, which includes using toliet, bedpan, or urinal?: A Little Help from another person bathing (including washing, rinsing, drying)?: A Little Help from another person to put on and taking off regular upper body clothing?: A Little Help from another person to put on and taking off regular lower body clothing?: A Little 6 Click Score: 18    End of Session Equipment Utilized During Treatment: Gait belt  OT Visit Diagnosis: Unsteadiness on feet (R26.81);Other abnormalities of gait and mobility (R26.89);Repeated falls (R29.6);Muscle weakness (generalized) (M62.81)   Activity Tolerance Patient tolerated treatment well   Patient Left in chair;with call bell/phone within reach;with chair alarm set;with family/visitor present   Nurse Communication Mobility status        Time: 1355-1421 OT Time Calculation (min): 26 min  Charges: OT General Charges $OT Visit: 1 Visit OT Treatments $Self Care/Home Management : 8-22 mins Shanon Payor, OTD OTR/L   12/29/20, 2:37 PM

## 2020-12-30 LAB — GLUCOSE, CAPILLARY
Glucose-Capillary: 118 mg/dL — ABNORMAL HIGH (ref 70–99)
Glucose-Capillary: 125 mg/dL — ABNORMAL HIGH (ref 70–99)
Glucose-Capillary: 169 mg/dL — ABNORMAL HIGH (ref 70–99)
Glucose-Capillary: 165 mg/dL — ABNORMAL HIGH (ref 70–99)
Glucose-Capillary: 184 mg/dL — ABNORMAL HIGH (ref 70–99)

## 2020-12-30 MED ORDER — HEPARIN SODIUM (PORCINE) 1000 UNIT/ML IJ SOLN
4200.0000 [IU] | Freq: Once | INTRAMUSCULAR | Status: AC
  Administered 2020-12-30: 4200 [IU] via INTRAVENOUS

## 2020-12-30 MED ORDER — HEPARIN SODIUM (PORCINE) 1000 UNIT/ML IJ SOLN
INTRAMUSCULAR | Status: AC
  Filled 2020-12-30: qty 1

## 2020-12-30 NOTE — Progress Notes (Signed)
[facility] [place] [facility] NAME: Matthew [name]    MR#:  [id number]  DATE OF BIRTH:  [birth year]  SUBJECTIVE:   patient denies any complaints. Complains of right shoulder hurting on movement. Has a right shoulder sling for fracture humerus. Daughter at bedside. Got back from HD earlier --UF 1500 cc REVIEW OF SYSTEMS:   Review of Systems  Constitutional:  Negative for chills, fever and weight loss.  HENT:  Negative for ear discharge, ear pain and nosebleeds.   Eyes:  Negative for blurred vision, pain and discharge.  Respiratory:  Negative for sputum production, shortness of breath, wheezing and stridor.   Cardiovascular:  Negative for chest pain, palpitations, orthopnea and PND.  Gastrointestinal:  Negative for abdominal pain, diarrhea, nausea and vomiting.  Genitourinary:  Negative for frequency and urgency.  Musculoskeletal:  Positive for joint pain. Negative for back pain.  Neurological:  Positive for weakness. Negative for sensory change, speech change and focal weakness.  Psychiatric/Behavioral:  Negative for depression and hallucinations. The patient is not nervous/anxious.   Tolerating Diet:yes Tolerating PT: reahb  DRUG ALLERGIES:   Allergies  Allergen Reactions   Other     Other reaction(s): Myalgias (intolerance), Other (See Comments) Statin Drugs  Statin Drugs     Amoxicillin Diarrhea    VITALS:  Blood pressure (!) 135/108, pulse 65, temperature 98.9 F (37.2 C), temperature source Oral, resp. rate 20, height 6' (1.829 m), weight 97.5 kg, SpO2 95 %.  PHYSICAL EXAMINATION:   Physical Exam  GENERAL:  87 y.o.-year-old patient lying in the bed with no acute distress.  LUNGS: Normal breath sounds bilaterally, no wheezing, rales, rhonchi. No use of accessory muscles of respiration.  CARDIOVASCULAR: S1, S2 normal. No murmurs, rubs, or gallops.  ABDOMEN: Soft, nontender, nondistended. Bowel sounds present. No organomegaly or mass.   EXTREMITIES: No cyanosis, clubbing or edema b/l.  Right arm sling+  NEUROLOGIC: Cranial nerves II through XII are intact. No focal Motor or sensory deficits b/l.   PSYCHIATRIC:  patient is alert and oriented x 3.  SKIN: No obvious rash, lesion, or ulcer.   LABORATORY PANEL:  CBC Recent Labs  Lab 12/28/20 0900  WBC 7.5  HGB 9.5*  HCT 27.9*  PLT 133*     Chemistries  Recent Labs  Lab 12/25/20 0831 12/26/20 0501 12/28/20 0900  NA 135   < > 135  K 5.2*   < > 4.4  CL 100   < > 97*  CO2 28   < > 29  GLUCOSE 129*   < > 142*  BUN 53*   < > 34*  CREATININE 3.45*   < > 3.29*  CALCIUM 9.3   < > 8.7*  AST 35  --   --   ALT 34  --   --   ALKPHOS 94  --   --   BILITOT 0.9  --   --    < > = values in this interval not displayed.    Cardiac Enzymes No results for input(s): TROPONINI in the last 168 hours. RADIOLOGY:  No results found. ASSESSMENT AND PLAN:  87 year old gentleman with prior history of type 2 diabetes mellitus, coronary artery disease, end-stage renal disease on dialysis, hypo-thyroidism, chronic systolic heart failure presents from home with a fall was found to have a right humerus fracture.  Orthopedics consulted on admission recommended conservative management with sling and outpatient follow-up in 1 week to 10 days  Proximal humerus fracture  on the right --Case discussed with orthopedic surgery  ( I believe that would be Dr Harlow Mares on call on aug 4th--no ortho MD name documented by previous MD's)recommended outpatient follow-up in 7 to 10 days.  -- recommend a right arm immobilizer/sling --Pain control and therapy evaluations recommending SNF.    Hypotension Appears to have resolved Taper midodrine to off   Coronary artery disease s/p CABG --Patient currently denies any chest pain. --Continue with Plavix Crestor and Zetia.   Hypothyroidism --Continue with Synthroid.   Chronic systolic heart failure --Further management as per dialysis.   --Patient was  on furosemide at home currently on hold due to hypotension.  If his blood pressure improves he can be  be discharged on Lasix on nondialysis days.   End-stage renal disease on dialysis with TTS Nephrology on board. . Type 2 diabetes mellitus with end-stage renal disease--Continue with sliding scale insulin at this time. --Last A!c is 6.3 % in 10/2020    Anemia of chronic disease --Hemoglobin stable around 9 --Transfuse to keep hemoglobin greater than 7.       Mild thrombocytopenia Continue to monitor--appears stable     DVT prophylaxis: Heparin Code Status: DNR Family Communication: dter at bedside Disposition: to rehab once bed available   Status is: Inpatient     Dispo: The patient is from: Home              Anticipated d/c is to: SNF              Patient currently is medically stable to d/c.              Difficult to place patient No  8/8--no bed offers 8/9-- No bed offers yet               TOTAL TIME TAKING CARE OF THIS PATIENT: 24 minutes.  >50% time spent on counselling and coordination of care  Note: This dictation was prepared with Dragon dictation along with smaller phrase technology. Any transcriptional errors that result from this process are unintentional.  Fritzi Mandes M.D    [facility]   CC: Primary care physician; Jodi Marble, MD Patient ID: Matthew [name], male   DOB: 1931-01-14, 87 y.o.   MRN: [id number]

## 2020-12-30 NOTE — Progress Notes (Addendum)
This pt was dialyzed for 3 hours today with a net UF of 1.5L as ordered. Patient tolerated his tx and the UF, vital signs remained stable post HD, cvc was locked with heparin, dressing was clean, dry, intact and not due for changing today. Handoff report given to care RN post HD.

## 2020-12-30 NOTE — Progress Notes (Signed)
[facility]  ROUNDING NOTE   Subjective:   [name] was admitted to [facility] on 12/25/2020 for Fall [W19.XXXA] Closed fracture of proximal end of right humerus, unspecified fracture morphology, initial encounter [S42.201A] Hypotension [I95.9]  Patient seen during dialysis   HEMODIALYSIS FLOWSHEET:  Blood Flow Rate (mL/min): 0 mL/min Arterial Pressure (mmHg): -220 mmHg Venous Pressure (mmHg): 110 mmHg Transmembrane Pressure (mmHg): 60 mmHg Ultrafiltration Rate (mL/min): 800 mL/min Dialysate Flow Rate (mL/min): 500 ml/min Conductivity: Machine : 13.7 Conductivity: Machine : 13.7 Dialysis Fluid Bolus: Normal Saline Bolus Amount (mL): 300 mL  No complaints at this time Resting comfortable   Objective:  Vital signs in last 24 hours:  Temp:  [97.8 F (36.6 C)-98.9 F (37.2 C)] 98.9 F (37.2 C) (08/09 0742) Pulse Rate:  [61-89] 78 (08/09 1145) Resp:  [12-21] 19 (08/09 1140) BP: (119-186)/(55-75) 158/68 (08/09 1145) SpO2:  [98 %-100 %] 100 % (08/09 1140)  Weight change:  Filed Weights   12/25/20 0627  Weight: 97.5 kg    Intake/Output: I/O last 3 completed shifts: In: 720 [P.O.:720] Out: 1225 [Urine:1225]   Intake/Output this shift:  Total I/O In: -  Out: 1500 [Other:1500]  Physical Exam: General: NAD, laying in bed  Head: Normocephalic, atraumatic. Moist oral mucosal membranes  Eyes: Anicteric  Lungs:  Clear to auscultation,normal effort  Heart: Regular rate and rhythm  Abdomen:  Soft, nontender  Extremities:  no peripheral edema.  Neurologic: Nonfocal, moving all four extremities  Skin: No lesions  Access: RIJ permcath    Basic Metabolic Panel: Recent Labs  Lab 12/25/20 0831 12/26/20 0501 12/28/20 0900  NA 135 134* 135  K 5.2* 4.6 4.4  CL 100 99 97*  CO2 28 29 29   GLUCOSE 129* 100* 142*  BUN 53* 37* 34*  CREATININE 3.45* 3.49* 3.29*  CALCIUM 9.3 8.6* 8.7*     Liver Function Tests: Recent Labs  Lab 12/25/20 0831  AST  35  ALT 34  ALKPHOS 94  BILITOT 0.9  PROT 6.6  ALBUMIN 3.4*    No results for input(s): LIPASE, AMYLASE in the last 168 hours. No results for input(s): AMMONIA in the last 168 hours.  CBC: Recent Labs  Lab 12/25/20 0831 12/28/20 0900  WBC 7.8 7.5  NEUTROABS 5.6  --   HGB 11.5* 9.5*  HCT 34.0* 27.9*  MCV 99.1 96.9  PLT 136* 133*     Cardiac Enzymes: No results for input(s): CKTOTAL, CKMB, CKMBINDEX, TROPONINI in the last 168 hours.  BNP: Invalid input(s): POCBNP  CBG: Recent Labs  Lab 12/29/20 0832 12/29/20 1203 12/29/20 1650 12/29/20 2117 12/30/20 0745  GLUCAP 160* 132* 169* 154* 118*     Microbiology: Results for orders placed or performed during the hospital encounter of 12/25/20  Resp Panel by RT-PCR (Flu A&B, Covid) Nasopharyngeal Swab     Status: None   Collection Time: 12/25/20  8:46 AM   Specimen: Nasopharyngeal Swab; Nasopharyngeal(NP) swabs in vial transport medium  Result Value Ref Range Status   SARS Coronavirus 2 by RT PCR NEGATIVE NEGATIVE Final    Comment: (NOTE) SARS-CoV-2 target nucleic acids are NOT DETECTED.  The SARS-CoV-2 RNA is generally detectable in upper respiratory specimens during the acute phase of infection. The lowest concentration of SARS-CoV-2 viral copies this assay can detect is 138 copies/mL. A negative result does not preclude SARS-Cov-2 infection and should not be used as the sole basis for treatment or other patient management decisions. A negative result may occur with  improper specimen collection/handling, submission of specimen other than nasopharyngeal swab, presence of viral mutation(s) within the areas targeted by this assay, and inadequate number of viral copies(<138 copies/mL). A negative result must be combined with clinical observations, patient history, and epidemiological information. The expected result is Negative.  Fact Sheet for Patients:  EntrepreneurPulse.com.au  Fact Sheet  for Healthcare Providers:  IncredibleEmployment.be  This test is no t yet approved or cleared by the [place] FDA and  has been authorized for detection and/or diagnosis of SARS-CoV-2 by FDA under an Emergency Use Authorization (EUA). This EUA will remain  in effect (meaning this test can be used) for the duration of the COVID-19 declaration under Section 564(b)(1) of the Act, 21 U.S.C.section 360bbb-3(b)(1), unless the authorization is terminated  or revoked sooner.       Influenza A by PCR NEGATIVE NEGATIVE Final   Influenza B by PCR NEGATIVE NEGATIVE Final    Comment: (NOTE) The Xpert Xpress SARS-CoV-2/FLU/RSV plus assay is intended as an aid in the diagnosis of influenza from Nasopharyngeal swab specimens and should not be used as a sole basis for treatment. Nasal washings and aspirates are unacceptable for Xpert Xpress SARS-CoV-2/FLU/RSV testing.  Fact Sheet for Patients: EntrepreneurPulse.com.au  Fact Sheet for Healthcare Providers: IncredibleEmployment.be  This test is not yet approved or cleared by the [place] FDA and has been authorized for detection and/or diagnosis of SARS-CoV-2 by FDA under an Emergency Use Authorization (EUA). This EUA will remain in effect (meaning this test can be used) for the duration of the COVID-19 declaration under Section 564(b)(1) of the Act, 21 U.S.C. section 360bbb-3(b)(1), unless the authorization is terminated or revoked.  Performed at [facility], [address] [place], [place] [address]     Coagulation Studies: No results for input(s): LABPROT, INR in the last 72 hours.  Urinalysis: No results for input(s): COLORURINE, LABSPEC, PHURINE, GLUCOSEU, HGBUR, BILIRUBINUR, KETONESUR, PROTEINUR, UROBILINOGEN, NITRITE, LEUKOCYTESUR in the last 72 hours.  Invalid input(s): APPERANCEUR    Imaging: No results found.   Medications:    sodium chloride       heparin sodium (porcine)       amiodarone  200 mg Oral Daily   Chlorhexidine Gluconate Cloth  6 each Topical Q0600   cholecalciferol  1,000 Units Oral Q breakfast   clopidogrel  75 mg Oral Q breakfast   ezetimibe  10 mg Oral QAC breakfast   ferrous sulfate  325 mg Oral BID WC   heparin  5,000 Units Subcutaneous Q8H   insulin aspart  0-6 Units Subcutaneous TID WC   levothyroxine  75 mcg Oral QAC breakfast   loratadine  10 mg Oral q morning   metoprolol succinate  12.5 mg Oral QHS   multivitamin  1 tablet Oral Daily   omega-3 acid ethyl esters  4,000 mg Oral Q lunch   polyethylene glycol  17 g Oral Daily   rosuvastatin  40 mg Oral QHS   senna-docusate  2 tablet Oral BID   sodium chloride flush  3 mL Intravenous Q12H   sodium chloride, bisacodyl, fluticasone, HYDROcodone-acetaminophen, nitroGLYCERIN, ondansetron **OR** ondansetron (ZOFRAN) IV, sodium chloride flush, traMADol  Assessment/ Plan:  [name] is a 87 y.o. white male with end stage renal disease on hemodialysis, hypertension, atrial fibrillation, coronary artery disease status post CABG, diabetes mellitus type II, hypothyroidism, who is admitted to [facility] on 12/25/2020 for Fall [W19.XXXA] Closed fracture of proximal end of right humerus, unspecified fracture morphology, initial encounter [S42.201A]  Hypotension [I95.9] Found to have a right humerus fracture.   [facility] TTS [address] RIJ permcath 98.5kg  End Stage Renal Disease: TTS, Received dialysis today. UF goal 1.5L removed. Next treatment scheduled for Thursday.  Will monitor discharge plan for adjustments needed to outpatient dialysis needs.  Hypotension:  Home regimen of midodrine before dialysis and for hypertension he takes metoprolol and furosemide. Furosemide used for volume control and may be transitioned to non-dialysis days. Midodrine increased to QID. BP 158/68  Anemia of chronic kidney disease: hemoglobin at goal 9.5. Will monitor   Secondary  Hyperparathyroidism: outpatient PTH on 7/12 was low at 134. Not currently on binders.  Phosphorus low Hold vitamin D agents.     LOS: Elizabeth 8/9/202212:05 PM

## 2020-12-30 NOTE — TOC Progression Note (Signed)
Transition of Care [facility]) - Progression Note    Patient Details  Name: Matthew [name] MRN: [id number] Date of Birth: 1930-12-13  Transition of Care [facility]) CM/SW [place], LCSW Phone Number: 12/30/2020, 9:56 AM  Clinical Narrative: [facility] is unable to offer a bed due to HD. [facility] is reviewing referral. Left message for [facility] admissions coordinator asking her to review.    Expected Discharge Plan: [place] Barriers to Discharge: Continued Medical Work up  Expected Discharge Plan and Services Expected Discharge Plan: [place] Choice: [place] arrangements for the past 2 months: Single Family Home                                       Social Determinants of Health (SDOH) Interventions    Readmission Risk Interventions No flowsheet data found.

## 2020-12-30 NOTE — Progress Notes (Addendum)
CNA assisted patient sitting on side of bed to [facility]. She stated that patient had "seizure-like activity that lasted about 5 second" and that he was not-responsive, eyes fixed for a couple of seconds. Daughter reports that patient became pale and dizzy. Patient only reported dizziness. Upon assessment patient was pale, PERRLA, & oriented. BP 135/108, P 65, RR 20, T 98.9. Blood Sugar check 169.

## 2020-12-30 NOTE — Progress Notes (Signed)
PT Cancellation Note  Patient Details Name: Matthew [name] MRN: [id number] DOB: Mar 05, 1931   Cancelled Treatment:     PT attempt. Pt already off floor for HD. Will return later and continue to follow per current POC.    Willette Pa 12/30/2020, 8:52 AM

## 2020-12-30 NOTE — Progress Notes (Addendum)
Occupational Therapy Treatment Patient Details Name: Matthew [name] MRN: [id number] DOB: [birth year] Today's Date: 12/30/2020    History of present illness Matthew [name] is a 87 y.o. male with medical history significant for diabetes mellitus with complications of end-stage renal disease on hemodialysis (dialysis days are Tuesday/Thursday/Saturday), coronary artery disease status post CABG, hypertension who presents to the ER for evaluation after he fell this morning while getting ready to go to dialysis.  Patient states that he leaned over to pick up a sock when he lost his balance and fell landing on his right shoulder.  He denied feeling dizzy or lightheaded and did not hit his head.  There was no loss of consciousness.   OT comments  Chart reviewed, pt greeted semi supine in bed, daughter present, agreeable to OT tx session. Pt endorses fatigue, however requests to attempt transfer to bedside chair. Tx session targeted functional mobility in preparation for ADL completion. Pt performs supine>seated at EOB with HOB raised with MIN A. Pt performs STS with MIN A, short ambulatory transfer with hand held assist with MIN A +gait belt. Increased time required throughout. Vital signs monitored and stable throughout. Pt declines additional transfer to toilet on this date. Pt is progressing in functional mobility. Pt left in beside chair, NAD, all needs met. NT notified  of pt status. OT will continue to follow while admitted.    Follow Up Recommendations  SNF    Equipment Recommendations  Other (comment) (per next venue of care)    Recommendations for Other Services      Precautions / Restrictions Precautions Precautions: Fall;Other (comment) Type of Shoulder Precautions: R humerus fx- per MD note keep RUE immobilized Required Braces or Orthoses: Sling Restrictions RUE Weight Bearing: Non weight bearing Other Position/Activity Restrictions: in sling immobilized       Mobility Bed  Mobility Overal bed mobility: Needs Assistance Bed Mobility: Supine to Sit;Sit to Supine     Supine to sit: Min assist Sit to supine: Min assist        Transfers Overall transfer level: Needs assistance Equipment used: 1 person hand held assist Transfers: Sit to/from [facility] Sit to Stand: Min assist Stand pivot transfers: Min assist (short ambulatory transfer)            Balance Overall balance assessment: History of Falls Sitting-balance support: Single extremity supported;Feet supported Sitting balance-Leahy Scale: Good     Standing balance support: Single extremity supported Standing balance-Leahy Scale: Fair                             ADL either performed or assessed with clinical judgement   ADL Overall ADL's : Needs assistance/impaired                         Toilet Transfer: Minimal assistance Toilet Transfer Details (indicate cue type and reason): simulated to chair with gait belt         Functional mobility during ADLs: Minimal assistance       Vision       Perception     Praxis      Cognition Arousal/Alertness: Awake/alert Behavior During Therapy: WFL for tasks assessed/performed Overall Cognitive Status: Within Functional Limits for tasks assessed  Exercises     Shoulder Instructions       General Comments      Pertinent Vitals/ Pain       Pain Assessment: No/denies pain  Home Living                                          Prior Functioning/Environment              Frequency  Min 2X/week        Progress Toward Goals  OT Goals(current goals can now be found in the care plan section)  Progress towards OT goals: Progressing toward goals     Plan Discharge plan remains appropriate;Frequency remains appropriate    Co-evaluation                 AM-PAC OT "6 Clicks" Daily Activity      Outcome Measure   Help from another person eating meals?: A Little Help from another person taking care of personal grooming?: A Little Help from another person toileting, which includes using toliet, bedpan, or urinal?: A Little Help from another person bathing (including washing, rinsing, drying)?: A Little Help from another person to put on and taking off regular upper body clothing?: A Little Help from another person to put on and taking off regular lower body clothing?: A Little 6 Click Score: 18    End of Session Equipment Utilized During Treatment: Gait belt  OT Visit Diagnosis: Unsteadiness on feet (R26.81);Other abnormalities of gait and mobility (R26.89);Repeated falls (R29.6);Muscle weakness (generalized) (M62.81)   Activity Tolerance Patient tolerated treatment well   Patient Left in chair;with call bell/phone within reach;with chair alarm set;with family/visitor present   Nurse Communication Mobility status (NT aware of pt status)        Time: 1339-1400 OT Time Calculation (min): 21 min  Charges: OT General Charges $OT Visit: 1 Visit OT Treatments $Therapeutic Activity: 8-22 mins Shanon Payor, OTD OTR/L  12/30/20, 2:52 PM

## 2020-12-30 NOTE — Progress Notes (Signed)
OT Cancellation Note  Patient Details Name: Matthew [name] MRN: [id number] DOB: 05-20-1931   Cancelled Treatment:    Reason Eval/Treat Not Completed: Patient at procedure or test/ unavailable. Pt off floor for HD. Will re-attempt as able.  Shanon Payor, OTD OTR/L  12/30/20, 10:27 AM

## 2020-12-30 NOTE — Progress Notes (Signed)
PT Cancellation Note  Patient Details Name: Matthew [name] MRN: [id number] DOB: [birth year]   Cancelled Treatment:     PT attempt. PT hold. Per RN tech, " Pt had syncopal/seizure like activity when getting to [facility]. Author checked vitals and assisted pt with returning to bed. Will Hold PT this afternoon and resume current POC tomorrow.    Willette Pa 12/30/2020, 3:31 PM

## 2020-12-31 LAB — GLUCOSE, CAPILLARY
Glucose-Capillary: 118 mg/dL — ABNORMAL HIGH (ref 70–99)
Glucose-Capillary: 176 mg/dL — ABNORMAL HIGH (ref 70–99)
Glucose-Capillary: 137 mg/dL — ABNORMAL HIGH (ref 70–99)
Glucose-Capillary: 146 mg/dL — ABNORMAL HIGH (ref 70–99)

## 2020-12-31 MED ORDER — MIDODRINE HCL 5 MG PO TABS
5.0000 mg | ORAL_TABLET | Freq: Two times a day (BID) | ORAL | Status: DC
  Administered 2020-12-31 – 2021-01-01 (×3): 5 mg via ORAL
  Filled 2020-12-31 (×3): qty 1

## 2020-12-31 NOTE — Progress Notes (Signed)
Physical Therapy Treatment Patient Details Name: Matthew [name] MRN: [id number] DOB: Dec 06, 1930 Today's Date: 12/31/2020    History of Present Illness Matthew [name] is a 87 y.o. male with medical history significant for diabetes mellitus with complications of end-stage renal disease on hemodialysis (dialysis days are Tuesday/Thursday/Saturday), coronary artery disease status post CABG, hypertension who presents to the ER for evaluation after he fell this morning while getting ready to go to dialysis.  Patient states that he leaned over to pick up a sock when he lost his balance and fell landing on his right shoulder.  He denied feeling dizzy or lightheaded and did not hit his head.  There was no loss of consciousness.    PT Comments    Pt was long sitting in bed upon arriving with supportive daughter at bedside. Pt reports he had dizziness earlier when attempting to get up.Author feels LE BP readings giving false accuracy. Resting BP when checked on LE was extremely elevated (161/105) however rechecked on L UE at rest 128/65.  Upon sitting up EOB 106/52 without symptoms. Pt stood with LUE HHA with BP decreasing to 68/51. Pt was symptomatic and returned to supine. BP once returned to supine 114/49 after two minutes in bed 125/52. RN and MD made aware. MD requested therapy treat pt 30 minutes after midodrine given. Pt does quickly fall asleep and reports just feeling tired at conclusion of session. Highly recommend DC to SNF to assist pt to PLOF while maximizing independence with ADLs. Acute PT will continue to follow per current POC.    Follow Up Recommendations  SNF     Equipment Recommendations  None recommended by PT (pt has personal cane)       Precautions / Restrictions Precautions Precautions: Fall Type of Shoulder Precautions: R humerus fx- per MD note keep RUE immobilized Precaution Comments: orthostatic Required Braces or Orthoses: Sling;Other Brace (shoulder  immobilizer) Restrictions Weight Bearing Restrictions: Yes RUE Weight Bearing: Non weight bearing    Mobility  Bed Mobility Overal bed mobility: Needs Assistance Bed Mobility: Supine to Sit;Sit to Supine     Supine to sit: Min assist Sit to supine: Min assist   General bed mobility comments: pt requires min assist to progress form long sit to short sit EOB. min assist to return to bed after standing trial    Transfers Overall transfer level: Needs assistance Equipment used: 1 person hand held assist Transfers: Sit to/from Stand Sit to Stand: Min assist;From elevated surface         General transfer comment: pt performed STS 2 x EOB. Orthostatic hypotensive while standing. returned to supine quickly and BP elevates.  Ambulation/Gait             General Gait Details: unsafe due to hypotension   Balance Overall balance assessment: History of Falls Sitting-balance support: Single extremity supported;Feet supported Sitting balance-Leahy Scale: Good     Standing balance support: Single extremity supported Standing balance-Leahy Scale: Fair      Cognition Arousal/Alertness: Charity fundraiser During Therapy: WFL for tasks assessed/performed Overall Cognitive Status: Within Functional Limits for tasks assessed        General Comments: pt is alert and oriented however frustrated with lack of abilities and BP concerns             Pertinent Vitals/Pain Pain Assessment: No/denies pain     PT Goals (current goals can now be found in the care plan section) Acute Rehab PT Goals Patient Stated Goal: none stated Progress  towards PT goals: Progressing toward goals    Frequency    7X/week      PT Plan Current plan remains appropriate;Other (comment) (per MD, perform therapy~ 30 minutes after medication issued to prevent hypotension.)    Co-evaluation     PT goals addressed during session: Mobility/safety with mobility;Balance;Proper use of  DME;Strengthening/ROM        AM-PAC PT "6 Clicks" Mobility   Outcome Measure  Help needed turning from your back to your side while in a flat bed without using bedrails?: A Little Help needed moving from lying on your back to sitting on the side of a flat bed without using bedrails?: A Little Help needed moving to and from a bed to a chair (including a wheelchair)?: A Lot Help needed standing up from a chair using your arms (e.g., wheelchair or bedside chair)?: A Lot Help needed to walk in hospital room?: A Lot Help needed climbing 3-5 steps with a railing? : A Lot 6 Click Score: 14    End of Session Equipment Utilized During Treatment: Gait belt Activity Tolerance: Treatment limited secondary to medical complications (Comment) (limited by orthostatic hypotension) Patient left: in bed;with call bell/phone within reach;with bed alarm set;with family/visitor present Nurse Communication: Mobility status PT Visit Diagnosis: Unsteadiness on feet (R26.81);Repeated falls (R29.6);Muscle weakness (generalized) (M62.81);History of falling (Z91.81);Difficulty in walking, not elsewhere classified (R26.2)     Time: 1660-6301 PT Time Calculation (min) (ACUTE ONLY): 19 min  Charges:  $Therapeutic Activity: 8-22 mins                     Julaine Fusi PTA 12/31/20, 11:52 AM

## 2020-12-31 NOTE — Progress Notes (Signed)
[facility] [place] NAME: Matthew [name]    MR#:  [id number]  DATE OF BIRTH:  [birth year]  SUBJECTIVE:   patient denies any complaints. Complains of right shoulder hurting on movement. Has a right shoulder sling for fracture humerus. Daughter/wife  at bedside.  REVIEW OF SYSTEMS:   Review of Systems  Constitutional:  Negative for chills, fever and weight loss.  HENT:  Negative for ear discharge, ear pain and nosebleeds.   Eyes:  Negative for blurred vision, pain and discharge.  Respiratory:  Negative for sputum production, shortness of breath, wheezing and stridor.   Cardiovascular:  Negative for chest pain, palpitations, orthopnea and PND.  Gastrointestinal:  Negative for abdominal pain, diarrhea, nausea and vomiting.  Genitourinary:  Negative for frequency and urgency.  Musculoskeletal:  Positive for joint pain. Negative for back pain.  Neurological:  Positive for weakness. Negative for sensory change, speech change and focal weakness.  Psychiatric/Behavioral:  Negative for depression and hallucinations. The patient is not nervous/anxious.   Tolerating Diet:yes Tolerating PT: reahb  DRUG ALLERGIES:   Allergies  Allergen Reactions   Other     Other reaction(s): Myalgias (intolerance), Other (See Comments) Statin Drugs  Statin Drugs     Amoxicillin Diarrhea    VITALS:  Blood pressure (!) 162/59, pulse (!) 59, temperature 99.1 F (37.3 C), temperature source Oral, resp. rate 18, height 6' (1.829 m), weight 97.5 kg, SpO2 96 %.  PHYSICAL EXAMINATION:   Physical Exam  GENERAL:  87 y.o.-year-old patient lying in the bed with no acute distress.  LUNGS: Normal breath sounds bilaterally, no wheezing, rales, rhonchi. No use of accessory muscles of respiration.  CARDIOVASCULAR: S1, S2 normal. No murmurs, rubs, or gallops.  ABDOMEN: Soft, nontender, nondistended. Bowel sounds present. No organomegaly or mass.  EXTREMITIES: No cyanosis,  clubbing or edema b/l.  Right arm sling+  NEUROLOGIC: Cranial nerves II through XII are intact. No focal Motor or sensory deficits b/l.   PSYCHIATRIC:  patient is alert and oriented x 3.  SKIN: No obvious rash, lesion, or ulcer.   LABORATORY PANEL:  CBC Recent Labs  Lab 12/28/20 0900  WBC 7.5  HGB 9.5*  HCT 27.9*  PLT 133*     Chemistries  Recent Labs  Lab 12/25/20 0831 12/26/20 0501 12/28/20 0900  NA 135   < > 135  K 5.2*   < > 4.4  CL 100   < > 97*  CO2 28   < > 29  GLUCOSE 129*   < > 142*  BUN 53*   < > 34*  CREATININE 3.45*   < > 3.29*  CALCIUM 9.3   < > 8.7*  AST 35  --   --   ALT 34  --   --   ALKPHOS 94  --   --   BILITOT 0.9  --   --    < > = values in this interval not displayed.    Cardiac Enzymes No results for input(s): TROPONINI in the last 168 hours. RADIOLOGY:  No results found. ASSESSMENT AND PLAN:  87 year old gentleman with prior history of type 2 diabetes mellitus, coronary artery disease, end-stage renal disease on dialysis, hypo-thyroidism, chronic systolic heart failure presents from home with a fall was found to have a right humerus fracture.  Orthopedics consulted on admission recommended conservative management with sling and outpatient follow-up in 1 week to 10 days  Proximal humerus fracture on the right --Case discussed  with orthopedic surgery  ( I believe that would be Dr Harlow Mares on call on aug 4th--no ortho MD name documented by previous MD's)recommended outpatient follow-up in 7 to 10 days.  -- recommend a right arm immobilizer/sling --Pain control and therapy evaluations recommending SNF.    Hypotension Will give midodrine 5 mg bid--d/w dr Candiss Norse and family   Coronary artery disease s/p CABG --Patient currently denies any chest pain. --Continue with Plavix, Crestor and Zetia.   Hypothyroidism --Continue with Synthroid.   Chronic systolic heart failure --Further management as per dialysis.   --Patient was on furosemide at home  currently on hold due to hypotension.  If his blood pressure improves he can be  be discharged on Lasix on nondialysis days.   End-stage renal disease on dialysis with TTS Nephrology on board. . Type 2 diabetes mellitus with end-stage renal disease--Continue with sliding scale insulin at this time. --Last A!c is 6.3 % in 10/2020    Anemia of chronic disease --Hemoglobin stable around 9 --Transfuse to keep hemoglobin greater than 7.       Mild thrombocytopenia Continue to monitor--appears stable     DVT prophylaxis: Heparin Code Status: DNR Family Communication: dter at bedside Disposition: to rehab once bed available   Status is: Inpatient     Dispo: The patient is from: Home              Anticipated d/c is to: SNF once rehab bed available              Patient currently is medically stable to d/c.              Difficult to place patient No             TOTAL TIME TAKING CARE OF THIS PATIENT: 25 minutes.  >50% time spent on counselling and coordination of care  Note: This dictation was prepared with Dragon dictation along with smaller phrase technology. Any transcriptional errors that result from this process are unintentional.  Fritzi Mandes M.D    [facility]   CC: Primary care physician; Jodi Marble, MD Patient ID: Matthew [name], male   DOB: [birth year], 87 y.o.   MRN: [id number]

## 2020-12-31 NOTE — TOC Progression Note (Addendum)
Transition of Care [facility]) - Progression Note    Patient Details  Name: Matthew [name] MRN: [id number] Date of Birth: [birth year]  Transition [facility]) CM/SW [place], LCSW Phone Number: 12/31/2020, 11:37 AM  Clinical Narrative:   [facility] is able to offer a bed but requires specific drop of times at certain HD centers. If going to [facility], drop off has to be between 8 am-9:30 am. If going to [place], drop off time has to be between 9 am-10 am. If going to [place] or [place], drop off time has to be between 10 am-11 am. They are only able to transport on MWF. Sent information to HD coordinator although she is working in the clinic today. She went ahead and sent referral information to one of the [facility] centers yesterday and said if they did not have availability they could transfer info to one of their other centers if accepted by the [facility] MD. Left message for [facility] admissions coordinator to see if they would be able to offer him a bed or not. Daughter has been updated.  Expected Discharge Plan: [place] Barriers to Discharge: Continued Medical Work up  Expected Discharge Plan and Services Expected Discharge Plan: [place] Choice: [place] arrangements for the past 2 months: Single Family Home                                       Social Determinants of Health (SDOH) Interventions    Readmission Risk Interventions No flowsheet data found.

## 2020-12-31 NOTE — Progress Notes (Addendum)
Patient unable to complete the standing portion on orthostatic vitals this morning due to dizzyness. Patient states that he feels "really tired."

## 2020-12-31 NOTE — Progress Notes (Addendum)
[facility]  ROUNDING NOTE   Subjective:   [name] was admitted to [facility] on 12/25/2020 for Fall [W19.XXXA] Closed fracture of proximal end of right humerus, unspecified fracture morphology, initial encounter [S42.201A] Hypotension [I95.9]  Patient seen resting in bed Daughter at bedside Tolerating meals Denies shortness of breath Daughter concerned about patient's dizziness when standing Concerned it is hindering his ability to work with therapy  Hemodialysis yesterday, tolerated well States he was more tired than usual afterwards   Objective:  Vital signs in last 24 hours:  Temp:  [98.4 F (36.9 C)-99.1 F (37.3 C)] 99.1 F (37.3 C) (08/10 0735) Pulse Rate:  [57-91] 59 (08/10 0735) Resp:  [17-20] 18 (08/10 0735) BP: (124-162)/(54-108) 162/59 (08/10 0735) SpO2:  [95 %-100 %] 96 % (08/10 0735)  Weight change:  Filed Weights   12/25/20 0627  Weight: 97.5 kg    Intake/Output: I/O last 3 completed shifts: In: 120 [P.O.:120] Out: 2400 [Urine:900; Other:1500]   Intake/Output this shift:  Total I/O In: 240 [P.O.:240] Out: -   Physical Exam: General: NAD, laying in bed  Head: Normocephalic, atraumatic. Moist oral mucosal membranes  Eyes: Anicteric  Lungs:  Clear to auscultation,normal effort  Heart: Regular rate and rhythm  Abdomen:  Soft, nontender  Extremities:  no peripheral edema.  Neurologic: Nonfocal, moving all four extremities  Skin: No lesions  Access: RIJ permcath    Basic Metabolic Panel: Recent Labs  Lab 12/25/20 0831 12/26/20 0501 12/28/20 0900  NA 135 134* 135  K 5.2* 4.6 4.4  CL 100 99 97*  CO2 28 29 29   GLUCOSE 129* 100* 142*  BUN 53* 37* 34*  CREATININE 3.45* 3.49* 3.29*  CALCIUM 9.3 8.6* 8.7*     Liver Function Tests: Recent Labs  Lab 12/25/20 0831  AST 35  ALT 34  ALKPHOS 94  BILITOT 0.9  PROT 6.6  ALBUMIN 3.4*    No results for input(s): LIPASE, AMYLASE in the last 168 hours. No results for  input(s): AMMONIA in the last 168 hours.  CBC: Recent Labs  Lab 12/25/20 0831 12/28/20 0900  WBC 7.8 7.5  NEUTROABS 5.6  --   HGB 11.5* 9.5*  HCT 34.0* 27.9*  MCV 99.1 96.9  PLT 136* 133*     Cardiac Enzymes: No results for input(s): CKTOTAL, CKMB, CKMBINDEX, TROPONINI in the last 168 hours.  BNP: Invalid input(s): POCBNP  CBG: Recent Labs  Lab 12/30/20 1211 12/30/20 1459 12/30/20 1620 12/30/20 2007 12/31/20 0727  GLUCAP 125* 169* 165* 184* 118*     Microbiology: Results for orders placed or performed during the hospital encounter of 12/25/20  Resp Panel by RT-PCR (Flu A&B, Covid) Nasopharyngeal Swab     Status: None   Collection Time: 12/25/20  8:46 AM   Specimen: Nasopharyngeal Swab; Nasopharyngeal(NP) swabs in vial transport medium  Result Value Ref Range Status   SARS Coronavirus 2 by RT PCR NEGATIVE NEGATIVE Final    Comment: (NOTE) SARS-CoV-2 target nucleic acids are NOT DETECTED.  The SARS-CoV-2 RNA is generally detectable in upper respiratory specimens during the acute phase of infection. The lowest concentration of SARS-CoV-2 viral copies this assay can detect is 138 copies/mL. A negative result does not preclude SARS-Cov-2 infection and should not be used as the sole basis for treatment or other patient management decisions. A negative result may occur with  improper specimen collection/handling, submission of specimen other than nasopharyngeal swab, presence of viral mutation(s) within the areas targeted by this assay, and  inadequate number of viral copies(<138 copies/mL). A negative result must be combined with clinical observations, patient history, and epidemiological information. The expected result is Negative.  Fact Sheet for Patients:  EntrepreneurPulse.com.au  Fact Sheet for Healthcare Providers:  IncredibleEmployment.be  This test is no t yet approved or cleared by the [place] FDA and  has  been authorized for detection and/or diagnosis of SARS-CoV-2 by FDA under an Emergency Use Authorization (EUA). This EUA will remain  in effect (meaning this test can be used) for the duration of the COVID-19 declaration under Section 564(b)(1) of the Act, 21 U.S.C.section 360bbb-3(b)(1), unless the authorization is terminated  or revoked sooner.       Influenza A by PCR NEGATIVE NEGATIVE Final   Influenza B by PCR NEGATIVE NEGATIVE Final    Comment: (NOTE) The Xpert Xpress SARS-CoV-2/FLU/RSV plus assay is intended as an aid in the diagnosis of influenza from Nasopharyngeal swab specimens and should not be used as a sole basis for treatment. Nasal washings and aspirates are unacceptable for Xpert Xpress SARS-CoV-2/FLU/RSV testing.  Fact Sheet for Patients: EntrepreneurPulse.com.au  Fact Sheet for Healthcare Providers: IncredibleEmployment.be  This test is not yet approved or cleared by the [place] FDA and has been authorized for detection and/or diagnosis of SARS-CoV-2 by FDA under an Emergency Use Authorization (EUA). This EUA will remain in effect (meaning this test can be used) for the duration of the COVID-19 declaration under Section 564(b)(1) of the Act, 21 U.S.C. section 360bbb-3(b)(1), unless the authorization is terminated or revoked.  Performed at [facility], [address] [place], [place] [address]     Coagulation Studies: No results for input(s): LABPROT, INR in the last 72 hours.  Urinalysis: No results for input(s): COLORURINE, LABSPEC, PHURINE, GLUCOSEU, HGBUR, BILIRUBINUR, KETONESUR, PROTEINUR, UROBILINOGEN, NITRITE, LEUKOCYTESUR in the last 72 hours.  Invalid input(s): APPERANCEUR    Imaging: No results found.   Medications:    sodium chloride      amiodarone  200 mg Oral Daily   Chlorhexidine Gluconate Cloth  6 each Topical Q0600   cholecalciferol  1,000 Units Oral Q breakfast    clopidogrel  75 mg Oral Q breakfast   ezetimibe  10 mg Oral QAC breakfast   ferrous sulfate  325 mg Oral BID WC   heparin  5,000 Units Subcutaneous Q8H   insulin aspart  0-6 Units Subcutaneous TID WC   levothyroxine  75 mcg Oral QAC breakfast   loratadine  10 mg Oral q morning   metoprolol succinate  12.5 mg Oral QHS   midodrine  5 mg Oral BID WC   multivitamin  1 tablet Oral Daily   omega-3 acid ethyl esters  4,000 mg Oral Q lunch   polyethylene glycol  17 g Oral Daily   rosuvastatin  40 mg Oral QHS   senna-docusate  2 tablet Oral BID   sodium chloride flush  3 mL Intravenous Q12H   sodium chloride, bisacodyl, fluticasone, HYDROcodone-acetaminophen, nitroGLYCERIN, ondansetron **OR** ondansetron (ZOFRAN) IV, sodium chloride flush, traMADol  Assessment/ Plan:  [name] is a 87 y.o. white male with end stage renal disease on hemodialysis, hypertension, atrial fibrillation, coronary artery disease status post CABG, diabetes mellitus type II, hypothyroidism, who is admitted to [facility] on 12/25/2020 for Fall [W19.XXXA] Closed fracture of proximal end of right humerus, unspecified fracture morphology, initial encounter [S42.201A] Hypotension [I95.9] Found to have a right humerus fracture.   [facility] TTS [address] RIJ permcath 98.5kg  End Stage  Renal Disease: TTS, Received dialysis today. UF goal 1.5L removed. Next treatment scheduled for Thursday, no UF.  Will monitor discharge plan for adjustments needed to outpatient dialysis needs, based on rehab placement.  Hypotension:  Home regimen of midodrine before dialysis and for hypertension he takes metoprolol and furosemide. Furosemide used for volume control and will be transitioned to non-dialysis days. Midodrine discontinued.. BP 162/59. May require low dose midodrine before activities. Will order orthostatic vitals and determine need.   Anemia of chronic kidney disease: hemoglobin at goal 9.5. Will monitor with labs in  am  Secondary Hyperparathyroidism: outpatient PTH on 7/12 was low at 134. Not currently on binders.  Phosphorus low Hold vitamin D agents.     LOS: 5   8/10/202211:38 AM

## 2021-01-01 LAB — GLUCOSE, CAPILLARY
Glucose-Capillary: 128 mg/dL — ABNORMAL HIGH (ref 70–99)
Glucose-Capillary: 103 mg/dL — ABNORMAL HIGH (ref 70–99)
Glucose-Capillary: 148 mg/dL — ABNORMAL HIGH (ref 70–99)
Glucose-Capillary: 162 mg/dL — ABNORMAL HIGH (ref 70–99)

## 2021-01-01 LAB — CBC
HCT: 28 % — ABNORMAL LOW (ref 39.0–52.0)
Hemoglobin: 9.6 g/dL — ABNORMAL LOW (ref 13.0–17.0)
MCH: 32.4 pg (ref 26.0–34.0)
MCHC: 34.3 g/dL (ref 30.0–36.0)
MCV: 94.6 fL (ref 80.0–100.0)
Platelets: 163 10*3/uL (ref 150–400)
RBC: 2.96 MIL/uL — ABNORMAL LOW (ref 4.22–5.81)
RDW: 13.2 % (ref 11.5–15.5)
WBC: 6.9 10*3/uL (ref 4.0–10.5)
nRBC: 0 % (ref 0.0–0.2)

## 2021-01-01 LAB — BASIC METABOLIC PANEL
Anion gap: 9 (ref 5–15)
BUN: 53 mg/dL — ABNORMAL HIGH (ref 8–23)
CO2: 25 mmol/L (ref 22–32)
Calcium: 8.8 mg/dL — ABNORMAL LOW (ref 8.9–10.3)
Chloride: 99 mmol/L (ref 98–111)
Creatinine, Ser: 3.47 mg/dL — ABNORMAL HIGH (ref 0.61–1.24)
GFR, Estimated: 16 mL/min — ABNORMAL LOW (ref >60)
Glucose, Bld: 139 mg/dL — ABNORMAL HIGH (ref 70–99)
Potassium: 4.6 mmol/L (ref 3.5–5.1)
Sodium: 133 mmol/L — ABNORMAL LOW (ref 135–145)

## 2021-01-01 LAB — PHOSPHORUS: Phosphorus: 4.4 mg/dL (ref 2.5–4.6)

## 2021-01-01 MED ORDER — MIDODRINE HCL 5 MG PO TABS: 10.0000 mg | ORAL_TABLET | Freq: Two times a day (BID) | ORAL | Status: DC

## 2021-01-01 MED ORDER — MIDODRINE HCL 5 MG PO TABS
10.0000 mg | ORAL_TABLET | Freq: Two times a day (BID) | ORAL | Status: DC
  Administered 2021-01-01 – 2021-01-02 (×2): 10 mg via ORAL
  Filled 2021-01-01 (×2): qty 2

## 2021-01-01 MED ORDER — HEPARIN SODIUM (PORCINE) 1000 UNIT/ML IJ SOLN
INTRAMUSCULAR | Status: AC
  Filled 2021-01-01: qty 1

## 2021-01-01 NOTE — TOC Progression Note (Addendum)
Transition of Care [facility]) - Progression Note    Patient Details  Name: Matthew [name] MRN: [id number] Date of Birth: 1931-01-01  Transition of Care [facility]) CM/SW [place], LCSW Phone Number: 01/01/2021, 2:00 PM  Clinical Narrative:  HD coordinator has not heard back from HD centers yet. Updated daughter.   4:11 pm: The only HD center in [place] that has a MWF schedule is available is [facility]. Only chair times they have are 7:30 and 12:00. Sent this information to [facility] admissions coordinator to see if there is any way they can accommodate this.  4:52 pm: [facility] can transport patient to the 7:30 chair time. Daughter is aware. Left message for HD coordinator to let her know. [facility] can take him as soon as tomorrow if everything is set up for him to start on Monday. Sent secure chat to MD and nephrology NP to notify. Nephrology NP said he will not need another HD session before he leaves if he can start at center on Monday.  Expected Discharge Plan: [place] Barriers to Discharge: Continued Medical Work up  Expected Discharge Plan and Services Expected Discharge Plan: [place] Choice: [place] arrangements for the past 2 months: Single Family Home                                       Social Determinants of Health (SDOH) Interventions    Readmission Risk Interventions No flowsheet data found.

## 2021-01-01 NOTE — Progress Notes (Signed)
Physical Therapy Treatment Patient Details Name: Matthew [name] MRN: [id number] DOB: Dec 12, 1930 Today's Date: 01/01/2021    History of Present Illness Matthew [name] is a 87 y.o. male with medical history significant for diabetes mellitus with complications of end-stage renal disease on hemodialysis (dialysis days are Tuesday/Thursday/Saturday), coronary artery disease status post CABG, hypertension who presents to the ER for evaluation after he fell this morning while getting ready to go to dialysis.  Patient states that he leaned over to pick up a sock when he lost his balance and fell landing on his right shoulder.  He denied feeling dizzy or lightheaded and did not hit his head.  There was no loss of consciousness.    PT Comments    Pt was long sitting in bed. BP at rest 116/65. He required min assist to progress to sitting EOB. Performed exercises EOB prior to standing. Initially upon sitting up BP 86/52 (map 61). After exercises elevated minimally but pt was asymptomatic. He stood one time and comes symptomatic and c/o dizziness requiring to sit down. BP 69/43 (49). Quickly returns to supine via min assist. Dizziness resolved quickly however session discontinued. MD/RN made aware and medication adjustments being made. Will continue to treat pt as able per current POC. Will see pt after midodrine issued in hopes BP is able to stay high enough to safely participate. Currently recommend DC to SNF for safety. Expect pt to progress quickly once able to tolerate therapy. Has HD on  T,TH,sat.    Follow Up Recommendations  SNF     Equipment Recommendations  None recommended by PT       Precautions / Restrictions Precautions Precautions: Fall Type of Shoulder Precautions: R humerus fx- per MD note keep RUE immobilized Precaution Comments: orthostatic hypotensive Required Braces or Orthoses: Sling;Other Brace Restrictions Weight Bearing Restrictions: Yes RUE Weight Bearing: Non weight  bearing    Mobility  Bed Mobility Overal bed mobility: Needs Assistance Bed Mobility: Supine to Sit;Sit to Supine     Supine to sit: Min assist Sit to supine: Min assist   General bed mobility comments: Min assist to exit and return to bed. Limited by only having one UE to use. HOB was elevated. BP at rest in long sitting 116/65(80)    Transfers Overall transfer level: Needs assistance Equipment used: 1 person hand held assist Transfers: Sit to/from Stand Sit to Stand: Min assist;From elevated surface Stand pivot transfers: Min assist       General transfer comment: BP upon sitting up 86/52 (61). no LOB or symptoms of dizziness. performed several exercises prior to standing trial. upon standing, BP dropped to 69/43 ( map) 49. Quickly return pt to supine. Symptoms resolved. RN/MD notified. MD to increase medication.  Ambulation/Gait    General Gait Details: unsafe due to hypotension     Balance Overall balance assessment: History of Falls Sitting-balance support: Single extremity supported;Feet supported Sitting balance-Leahy Scale: Good     Standing balance support: Single extremity supported Standing balance-Leahy Scale: Fair      Cognition Arousal/Alertness: Awake/alert Behavior During Therapy: Flat affect Overall Cognitive Status: Within Functional Limits for tasks assessed        General Comments: pt is alert and oriented however frustrated with lack of abilities and BP concerns             Pertinent Vitals/Pain Pain Assessment: No/denies pain     PT Goals (current goals can now be found in the care plan section) Acute Rehab PT  Goals Patient Stated Goal: none stated Progress towards PT goals: Progressing toward goals    Frequency    7X/week      PT Plan Current plan remains appropriate;Other (comment)    Co-evaluation     PT goals addressed during session: Mobility/safety with mobility;Strengthening/ROM;Proper use of DME;Balance         AM-PAC PT "6 Clicks" Mobility   Outcome Measure  Help needed turning from your back to your side while in a flat bed without using bedrails?: A Little Help needed moving from lying on your back to sitting on the side of a flat bed without using bedrails?: A Little Help needed moving to and from a bed to a chair (including a wheelchair)?: A Lot Help needed standing up from a chair using your arms (e.g., wheelchair or bedside chair)?: A Lot Help needed to walk in hospital room?: A Lot Help needed climbing 3-5 steps with a railing? : A Lot 6 Click Score: 14    End of Session Equipment Utilized During Treatment: Gait belt Activity Tolerance: Treatment limited secondary to medical complications (Comment);Other (comment) (PT has been greatly limited by hypotension with activity) Patient left: in bed;with call bell/phone within reach;with bed alarm set;with family/visitor present Nurse Communication: Mobility status PT Visit Diagnosis: Unsteadiness on feet (R26.81);Repeated falls (R29.6);Muscle weakness (generalized) (M62.81);History of falling (Z91.81);Difficulty in walking, not elsewhere classified (R26.2)     Time: 1343-1410 PT Time Calculation (min) (ACUTE ONLY): 27 min  Charges:  $Therapeutic Activity: 23-37 mins                    Julaine Fusi PTA 01/01/21, 2:45 PM

## 2021-01-01 NOTE — Progress Notes (Signed)
Matthew [name] at [place] NAME: Matthew [name]    MR#:  [id number]  DATE OF BIRTH:  Oct 01, 1930  SUBJECTIVE:   patient denies any complaints Eating lunch. Dter at bedside REVIEW OF SYSTEMS:   Review of Systems  Constitutional:  Negative for chills, fever and weight loss.  HENT:  Negative for ear discharge, ear pain and nosebleeds.   Eyes:  Negative for blurred vision, pain and discharge.  Respiratory:  Negative for sputum production, shortness of breath, wheezing and stridor.   Cardiovascular:  Negative for chest pain, palpitations, orthopnea and PND.  Gastrointestinal:  Negative for abdominal pain, diarrhea, nausea and vomiting.  Genitourinary:  Negative for frequency and urgency.  Musculoskeletal:  Positive for joint pain. Negative for back pain.  Neurological:  Positive for weakness. Negative for sensory change, speech change and focal weakness.  Psychiatric/Behavioral:  Negative for depression and hallucinations. The patient is not nervous/anxious.   Tolerating Diet:yes Tolerating PT: reahb  DRUG ALLERGIES:   Allergies  Allergen Reactions   Other     Other reaction(s): Myalgias (intolerance), Other (See Comments) Statin Drugs  Statin Drugs     Amoxicillin Diarrhea    VITALS:  Blood pressure (!) 107/59, pulse 67, temperature 98.4 F (36.9 C), temperature source Oral, resp. rate 18, height 6' (1.829 m), weight 97.5 kg, SpO2 98 %.  PHYSICAL EXAMINATION:   Physical Exam  GENERAL:  87 y.o.-year-old patient lying in the bed with no acute distress.  LUNGS: Normal breath sounds bilaterally, no wheezing, rales, rhonchi. No use of accessory muscles of respiration.  CARDIOVASCULAR: S1, S2 normal. No murmurs, rubs, or gallops.  ABDOMEN: Soft, nontender, nondistended. Bowel sounds present. No organomegaly or mass.  EXTREMITIES: No cyanosis, clubbing or edema b/l.  Right arm sling+  NEUROLOGIC: Cranial nerves II through XII are intact. No  focal Motor or sensory deficits b/l.   PSYCHIATRIC:  patient is alert and oriented x 3.  SKIN: No obvious rash, lesion, or ulcer.   LABORATORY PANEL:  CBC Recent Labs  Lab 01/01/21 0452  WBC 6.9  HGB 9.6*  HCT 28.0*  PLT 163     Chemistries  Recent Labs  Lab 01/01/21 0452  NA 133*  K 4.6  CL 99  CO2 25  GLUCOSE 139*  BUN 53*  CREATININE 3.47*  CALCIUM 8.8*    Cardiac Enzymes No results for input(s): TROPONINI in the last 168 hours. RADIOLOGY:  No results found. ASSESSMENT AND PLAN:  87 year old gentleman with prior history of type 2 diabetes mellitus, coronary artery disease, end-stage renal disease on dialysis, hypo-thyroidism, chronic systolic heart failure presents from home with a fall was found to have a right humerus fracture.  Orthopedics consulted on admission recommended conservative management with sling and outpatient follow-up in 1 week to 10 days  Proximal humerus fracture on the right --Case discussed with orthopedic surgery  ( I believe that would be Dr Harlow Mares on call on aug 4th--no ortho MD name documented by previous MD's)recommended outpatient follow-up in 7 to 10 days.  -- recommend a right arm immobilizer/sling --Pain control and therapy evaluations recommending SNF. --8/11-- d/w dr Harlow Mares who has spoken with pt's dter regarding f/u plan after discharge   Orthostatic Hypotension Will give midodrine 5 mg bid--d/w dr Candiss Norse and family   Coronary artery disease s/p CABG --Continue with Plavix, Crestor and Zetia.   Hypothyroidism --Continue with Synthroid.   Chronic systolic heart failure --Further management as per dialysis.   --Patient  was on furosemide at home currently on hold due to hypotension.  If his blood pressure improves he can be  be discharged on Lasix on nondialysis days   End-stage renal disease on dialysis with TTS Nephrology on board. . Type 2 diabetes mellitus with end-stage renal disease--Continue with sliding scale insulin  at this time. --Last A1c is 6.3 % in 10/2020    Anemia of chronic disease --Hemoglobin stable around 9 --Transfuse to keep hemoglobin greater than 7.      Mild thrombocytopenia Continue to monitor--appears stable     DVT prophylaxis: Heparin Code Status: DNR Family Communication: dter at bedside Disposition: to rehab once bed available   Status is: Inpatient     Dispo: The patient is from: Home              Anticipated d/c is to: SNF once rehab bed available              Patient currently is medically stable to d/c.              Difficult to place patient No    Awaiting HD time coordination per SNF requirement.         TOTAL TIME TAKING CARE OF THIS PATIENT: 25 minutes.  >50% time spent on counselling and coordination of care  Note: This dictation was prepared with Dragon dictation along with smaller phrase technology. Any transcriptional errors that result from this process are unintentional.  Matthew [name] M.D    [facility]   CC: Primary care physician; Matthew Marble, MD Patient ID: Matthew [name], male   DOB: 05/18/1931, 87 y.o.   MRN: [id number]

## 2021-01-01 NOTE — Progress Notes (Signed)
PT Cancellation Note  Patient Details Name: Matthew [name] MRN: [id number] DOB: 1930/12/17   Cancelled Treatment:     Pt is off floor for HD. Will coordinate with RN after he returns to floor and has midodrine to attempt OOB activity.    Willette Pa 01/01/2021, 9:36 AM

## 2021-01-01 NOTE — Progress Notes (Signed)
[facility]  ROUNDING NOTE   Subjective:   Matthew [name] was admitted to [facility] on 12/25/2020 for Fall [W19.XXXA] Closed fracture of proximal end of right humerus, unspecified fracture morphology, initial encounter [S42.201A] Hypotension [I95.9]  Patient seen during dialysis   HEMODIALYSIS FLOWSHEET:  Blood Flow Rate (mL/min): 400 mL/min Arterial Pressure (mmHg): -170 mmHg Venous Pressure (mmHg): 110 mmHg Transmembrane Pressure (mmHg): 50 mmHg Ultrafiltration Rate (mL/min): 150 mL/min Dialysate Flow Rate (mL/min): 500 ml/min Conductivity: Machine : 13.5 Conductivity: Machine : 13.5 Dialysis Fluid Bolus: Normal Saline Bolus Amount (mL): 300 mL  Continues to complain of dizziness when out of bed   Objective:  Vital signs in last 24 hours:  Temp:  [97.9 F (36.6 C)-98.6 F (37 C)] 98.4 F (36.9 C) (08/11 1309) Pulse Rate:  [56-123] 67 (08/11 1309) Resp:  [14-28] 18 (08/11 1309) BP: (100-138)/(50-77) 107/59 (08/11 1309) SpO2:  [97 %-98 %] 98 % (08/11 1309)  Weight change:  Filed Weights   12/25/20 0627  Weight: 97.5 kg    Intake/Output: I/O last 3 completed shifts: In: 660 [P.O.:660] Out: 1150 [Urine:1150]   Intake/Output this shift:  No intake/output data recorded.  Physical Exam: General: NAD, laying in bed  Head: Normocephalic, atraumatic. Moist oral mucosal membranes  Eyes: Anicteric  Lungs:  Clear to auscultation,normal effort  Heart: Regular rate and rhythm  Abdomen:  Soft, nontender  Extremities:  no peripheral edema.  Neurologic: Nonfocal, moving all four extremities  Skin: No lesions  Access: RIJ permcath    Basic Metabolic Panel: Recent Labs  Lab 12/26/20 0501 12/28/20 0900 01/01/21 0452  NA 134* 135 133*  K 4.6 4.4 4.6  CL 99 97* 99  CO2 29 29 25   GLUCOSE 100* 142* 139*  BUN 37* 34* 53*  CREATININE 3.49* 3.29* 3.47*  CALCIUM 8.6* 8.7* 8.8*  PHOS  --   --  4.4     Liver Function Tests: No results for input(s):  AST, ALT, ALKPHOS, BILITOT, PROT, ALBUMIN in the last 168 hours.  No results for input(s): LIPASE, AMYLASE in the last 168 hours. No results for input(s): AMMONIA in the last 168 hours.  CBC: Recent Labs  Lab 12/28/20 0900 01/01/21 0452  WBC 7.5 6.9  HGB 9.5* 9.6*  HCT 27.9* 28.0*  MCV 96.9 94.6  PLT 133* 163     Cardiac Enzymes: No results for input(s): CKTOTAL, CKMB, CKMBINDEX, TROPONINI in the last 168 hours.  BNP: Invalid input(s): POCBNP  CBG: Recent Labs  Lab 12/31/20 1141 12/31/20 1643 12/31/20 2100 01/01/21 0816 01/01/21 1309  GLUCAP 176* 137* 146* 128* 103*     Microbiology: Results for orders placed or performed during the hospital encounter of 12/25/20  Resp Panel by RT-PCR (Flu A&B, Covid) Nasopharyngeal Swab     Status: None   Collection Time: 12/25/20  8:46 AM   Specimen: Nasopharyngeal Swab; Nasopharyngeal(NP) swabs in vial transport medium  Result Value Ref Range Status   SARS Coronavirus 2 by RT PCR NEGATIVE NEGATIVE Final    Comment: (NOTE) SARS-CoV-2 target nucleic acids are NOT DETECTED.  The SARS-CoV-2 RNA is generally detectable in upper respiratory specimens during the acute phase of infection. The lowest concentration of SARS-CoV-2 viral copies this assay can detect is 138 copies/mL. A negative result does not preclude SARS-Cov-2 infection and should not be used as the sole basis for treatment or other patient management decisions. A negative result may occur with  improper specimen collection/handling, submission of specimen other than nasopharyngeal swab,  presence of viral mutation(s) within the areas targeted by this assay, and inadequate number of viral copies(<138 copies/mL). A negative result must be combined with clinical observations, patient history, and epidemiological information. The expected result is Negative.  Fact Sheet for Patients:  EntrepreneurPulse.com.au  Fact Sheet for Healthcare Providers:   IncredibleEmployment.be  This test is no t yet approved or cleared by the [place] FDA and  has been authorized for detection and/or diagnosis of SARS-CoV-2 by FDA under an Emergency Use Authorization (EUA). This EUA will remain  in effect (meaning this test can be used) for the duration of the COVID-19 declaration under Section 564(b)(1) of the Act, 21 U.S.C.section 360bbb-3(b)(1), unless the authorization is terminated  or revoked sooner.       Influenza A by PCR NEGATIVE NEGATIVE Final   Influenza B by PCR NEGATIVE NEGATIVE Final    Comment: (NOTE) The Xpert Xpress SARS-CoV-2/FLU/RSV plus assay is intended as an aid in the diagnosis of influenza from Nasopharyngeal swab specimens and should not be used as a sole basis for treatment. Nasal washings and aspirates are unacceptable for Xpert Xpress SARS-CoV-2/FLU/RSV testing.  Fact Sheet for Patients: EntrepreneurPulse.com.au  Fact Sheet for Healthcare Providers: IncredibleEmployment.be  This test is not yet approved or cleared by the [place] FDA and has been authorized for detection and/or diagnosis of SARS-CoV-2 by FDA under an Emergency Use Authorization (EUA). This EUA will remain in effect (meaning this test can be used) for the duration of the COVID-19 declaration under Section 564(b)(1) of the Act, 21 U.S.C. section 360bbb-3(b)(1), unless the authorization is terminated or revoked.  Performed at [facility], [address] [place], [place] [address]     Coagulation Studies: No results for input(s): LABPROT, INR in the last 72 hours.  Urinalysis: No results for input(s): COLORURINE, LABSPEC, PHURINE, GLUCOSEU, HGBUR, BILIRUBINUR, KETONESUR, PROTEINUR, UROBILINOGEN, NITRITE, LEUKOCYTESUR in the last 72 hours.  Invalid input(s): APPERANCEUR    Imaging: No results found.   Medications:    sodium chloride      amiodarone  200 mg  Oral Daily   Chlorhexidine Gluconate Cloth  6 each Topical Q0600   cholecalciferol  1,000 Units Oral Q breakfast   clopidogrel  75 mg Oral Q breakfast   ezetimibe  10 mg Oral QAC breakfast   ferrous sulfate  325 mg Oral BID WC   heparin  5,000 Units Subcutaneous Q8H   insulin aspart  0-6 Units Subcutaneous TID WC   levothyroxine  75 mcg Oral QAC breakfast   loratadine  10 mg Oral q morning   metoprolol succinate  12.5 mg Oral QHS   midodrine  5 mg Oral BID WC   multivitamin  1 tablet Oral Daily   omega-3 acid ethyl esters  4,000 mg Oral Q lunch   polyethylene glycol  17 g Oral Daily   rosuvastatin  40 mg Oral QHS   senna-docusate  2 tablet Oral BID   sodium chloride flush  3 mL Intravenous Q12H   sodium chloride, bisacodyl, fluticasone, HYDROcodone-acetaminophen, nitroGLYCERIN, ondansetron **OR** ondansetron (ZOFRAN) IV, sodium chloride flush, traMADol  Assessment/ Plan:  Matthew [name] is a 87 y.o. white male with end stage renal disease on hemodialysis, hypertension, atrial fibrillation, coronary artery disease status post CABG, diabetes mellitus type II, hypothyroidism, who is admitted to [facility] on 12/25/2020 for Fall [W19.XXXA] Closed fracture of proximal end of right humerus, unspecified fracture morphology, initial encounter [S42.201A] Hypotension [I95.9] Found to have a right humerus fracture.  [facility] TTS [address] RIJ permcath 98.5kg  End Stage Renal Disease: TTS, Received dialysis today, no UF. Tolerated well. Next treatment scheduled for Saturday  Dialysis coordinator arranging outpatient clinic based on rehab situation. Bed offer in [place], awaiting [facility] response.  Hypotension:  Home regimen of midodrine before dialysis and for hypertension he takes metoprolol and furosemide. Furosemide used for volume control and will be transitioned to non-dialysis days. Midodrine discontinued.. BP 109/56. May require low dose midodrine before activities to manage  dizziness  Anemia of chronic kidney disease: hemoglobin at goal 9.6.   Secondary Hyperparathyroidism: outpatient PTH on 7/12 was low at 134. Not currently on binders.  Phosphorus at goal. Hold vitamin D agents.     LOS: [place] 8/11/20222:00 PM

## 2021-01-02 DIAGNOSIS — N186 End stage renal disease: Secondary | ICD-10-CM

## 2021-01-02 DIAGNOSIS — Z992 Dependence on renal dialysis: Secondary | ICD-10-CM

## 2021-01-02 DIAGNOSIS — I951 Orthostatic hypotension: Principal | ICD-10-CM

## 2021-01-02 LAB — GLUCOSE, CAPILLARY
Glucose-Capillary: 132 mg/dL — ABNORMAL HIGH (ref 70–99)
Glucose-Capillary: 135 mg/dL — ABNORMAL HIGH (ref 70–99)
Glucose-Capillary: 137 mg/dL — ABNORMAL HIGH (ref 70–99)
Glucose-Capillary: 156 mg/dL — ABNORMAL HIGH (ref 70–99)

## 2021-01-02 MED ORDER — MIDODRINE HCL 5 MG PO TABS
10.0000 mg | ORAL_TABLET | Freq: Four times a day (QID) | ORAL | Status: DC
  Administered 2021-01-02 – 2021-01-06 (×14): 10 mg via ORAL
  Filled 2021-01-02 (×14): qty 2

## 2021-01-02 NOTE — Progress Notes (Signed)
Patient ID: SUVAN STCYR, male   DOB: [birth year], 87 y.o.   MRN: [id number] [facility] PROGRESS NOTE  GERRETT LOMAN AJG:[id number] DOB: 10-23-1930 DOA: 12/25/2020 PCP: Jodi Marble, MD  HPI/Subjective: Patient okay at rest.  When he stands up does not feel well.  Today with standing up with physical therapy his blood pressure dropped down into the 60s and did not feel well.  Midodrine dose increased to 10 mg 4 times daily.  Initially admitted with fall and found to be hypotensive and a right humerus fracture.  Objective: Vitals:   01/02/21 0501 01/02/21 0809  BP: 113/67 (!) 120/59  Pulse: 63 60  Resp: 18 16  Temp: 98.6 F (37 C) 99.4 F (37.4 C)  SpO2: 96% 97%    Intake/Output Summary (Last 24 hours) at 01/02/2021 1500 Last data filed at 01/02/2021 1300 Gross per 24 hour  Intake 720 ml  Output 175 ml  Net 545 ml   Filed Weights   12/25/20 0627  Weight: 97.5 kg    ROS: Review of Systems  Respiratory:  Negative for shortness of breath.   Cardiovascular:  Negative for chest pain.  Gastrointestinal:  Negative for abdominal pain, nausea and vomiting.  Exam: Physical Exam HENT:     Head: Normocephalic.     Mouth/Throat:     Pharynx: No oropharyngeal exudate.  Eyes:     General: Lids are normal.     Conjunctiva/sclera: Conjunctivae normal.  Cardiovascular:     Rate and Rhythm: Normal rate and regular rhythm.     Heart sounds: Normal heart sounds, S1 normal and S2 normal.  Pulmonary:     Breath sounds: No decreased breath sounds, wheezing, rhonchi or rales.  Abdominal:     Palpations: Abdomen is soft.     Tenderness: There is no abdominal tenderness.  Musculoskeletal:     Right lower leg: Swelling present.     Left lower leg: Swelling present.     Comments: Right arm immobilized.  Skin:    General: Skin is warm.     Comments: Right back shingles rash dried up.  Neurological:     Mental Status: He is alert and oriented to person, place, and time.       Scheduled Meds:  amiodarone  200 mg Oral Daily   Chlorhexidine Gluconate Cloth  6 each Topical Q0600   cholecalciferol  1,000 Units Oral Q breakfast   clopidogrel  75 mg Oral Q breakfast   ezetimibe  10 mg Oral QAC breakfast   ferrous sulfate  325 mg Oral BID WC   heparin  5,000 Units Subcutaneous Q8H   insulin aspart  0-6 Units Subcutaneous TID WC   levothyroxine  75 mcg Oral QAC breakfast   loratadine  10 mg Oral q morning   metoprolol succinate  12.5 mg Oral QHS   midodrine  10 mg Oral QID   multivitamin  1 tablet Oral Daily   omega-3 acid ethyl esters  4,000 mg Oral Q lunch   polyethylene glycol  17 g Oral Daily   rosuvastatin  40 mg Oral QHS   senna-docusate  2 tablet Oral BID   sodium chloride flush  3 mL Intravenous Q12H   Continuous Infusions:  sodium chloride      Assessment/Plan:  Orthostatic hypotension.  Today's blood pressure dropped into the 60s initially with standing prior to midodrine.  Increase midodrine to 4 times a day dosing.  We will check an a.m. cortisol. Proximal right humerus fracture.  Follow-up with Dr. Harlow Mares as outpatient. Hypothyroidism unspecified on levothyroxine Chronic systolic congestive heart failure.  Dialysis to manage fluid with blood pressure being on the lower side. History of CAD on metoprolol, Plavix, Crestor and Zetia Type 2 diabetes with end-stage renal disease on dialysis Tuesday Thursday and Saturday but likely will be switched over to Monday Wednesday Friday at rehab.  Holding glipizide at this point.  Sugars are acceptable Arrhythmia history on metoprolol and amiodarone        Code Status:     Code Status Orders  (From admission, onward)           Start     Ordered   12/25/20 1343  Do not attempt resuscitation (DNR)  Continuous       Question Answer Comment  In the event of cardiac or respiratory ARREST Do not call a "code blue"   In the event of cardiac or respiratory ARREST Do not perform Intubation,  CPR, defibrillation or ACLS   In the event of cardiac or respiratory ARREST Use medication by any route, position, wound care, and other measures to relive pain and suffering. May use oxygen, suction and manual treatment of airway obstruction as needed for comfort.   Comments Code status was discussed with patient and he is a DNR      12/25/20 1342           Code Status History     Date Active Date Inactive Code Status Order ID Comments User Context   11/01/2020 1643 11/05/2020 2001 DNR [id number]  Kayleen Memos, DO ED   11/01/2020 1554 11/01/2020 1643 Full Code [id number]  Kayleen Memos, DO ED   11/01/2020 1553 11/01/2020 1554 Full Code [id number]  Kayleen Memos, DO ED   05/24/2019 0431 05/26/2019 1925 Full Code [id number]  Mansy, Arvella Merles, MD ED   02/15/2018 1910 02/17/2018 1529 DNR [id number]  Saundra Shelling, MD Inpatient   01/19/2018 0104 01/20/2018 1812 DNR [id number]  Amelia Jo, MD Inpatient   01/10/2018 1248 01/15/2018 1546 DNR [id number]  Loletha Grayer, MD Inpatient   01/10/2018 1224 01/10/2018 1248 Full Code [id number]  Loletha Grayer, MD Inpatient   12/09/2017 1835 12/11/2017 1508 Full Code [id number]  Loletha Grayer, MD Inpatient   12/06/2013 1125 12/07/2013 1536 Full Code [id number]  Belva Crome, MD Inpatient   12/04/2013 0955 12/06/2013 1125 Full Code [id number]  Leonie Man, MD Inpatient   12/03/2013 0407 12/04/2013 0955 Full Code [id number]  Rise Patience, MD Inpatient      Advance Directive Documentation    Flowsheet Row Most Recent Value  Type of Advance Directive Healthcare Power of Attorney, Living will  Pre-existing out of facility DNR order (yellow form or pink MOST form) --  "MOST" Form in Place? --      Family Communication: Spoke with daughter at the bedside this morning and on the phone this afternoon Disposition Plan: Status is: Inpatient  Dispo: The patient is from: Home              Anticipated d/c is to: Rehab              Patient currently with blood  pressure dropping down this morning not stable to go out to rehab today   Difficult to place patient.  Yes.  Trying to manage dialysis and rehab.  Time spent: 28 minutes  [place]

## 2021-01-02 NOTE — Progress Notes (Signed)
[facility]  ROUNDING NOTE   Subjective:   [name] was admitted to [facility] on 12/25/2020 for Fall [W19.XXXA] Closed fracture of proximal end of right humerus, unspecified fracture morphology, initial encounter [S42.201A] Hypotension [I95.9]  Patient seen sitting up in chair Alert and oriented Daughter at bedside Continues to complain of dizziness with activities.  Per daughter he was given the Midodrine prior to getting up in chair   Objective:  Vital signs in last 24 hours:  Temp:  [97.7 F (36.5 C)-99.4 F (37.4 C)] 99.4 F (37.4 C) (08/12 0809) Pulse Rate:  [54-64] 60 (08/12 0809) Resp:  [16-18] 16 (08/12 0809) BP: (99-120)/(50-67) 120/59 (08/12 0809) SpO2:  [96 %-98 %] 97 % (08/12 0809)  Weight change:  Filed Weights   12/25/20 0627  Weight: 97.5 kg    Intake/Output: I/O last 3 completed shifts: In: 240 [P.O.:240] Out: 475 [Urine:475]   Intake/Output this shift:  Total I/O In: 480 [P.O.:480] Out: -   Physical Exam: General: NAD, sitting in chair  Head: Normocephalic, atraumatic. Moist oral mucosal membranes  Eyes: Anicteric  Lungs:  Clear to auscultation,normal effort  Heart: Regular rate and rhythm  Abdomen:  Soft, nontender  Extremities: No peripheral edema.  Neurologic: Nonfocal, moving all four extremities  Skin: No lesions  Access: RIJ permcath    Basic Metabolic Panel: Recent Labs  Lab 12/28/20 0900 01/01/21 0452  NA 135 133*  K 4.4 4.6  CL 97* 99  CO2 29 25  GLUCOSE 142* 139*  BUN 34* 53*  CREATININE 3.29* 3.47*  CALCIUM 8.7* 8.8*  PHOS  --  4.4     Liver Function Tests: No results for input(s): AST, ALT, ALKPHOS, BILITOT, PROT, ALBUMIN in the last 168 hours.  No results for input(s): LIPASE, AMYLASE in the last 168 hours. No results for input(s): AMMONIA in the last 168 hours.  CBC: Recent Labs  Lab 12/28/20 0900 01/01/21 0452  WBC 7.5 6.9  HGB 9.5* 9.6*  HCT 27.9* 28.0*  MCV 96.9 94.6  PLT 133*  163     Cardiac Enzymes: No results for input(s): CKTOTAL, CKMB, CKMBINDEX, TROPONINI in the last 168 hours.  BNP: Invalid input(s): POCBNP  CBG: Recent Labs  Lab 01/01/21 1309 01/01/21 1617 01/01/21 2222 01/02/21 0809 01/02/21 1203  GLUCAP 103* 148* 162* 132* 135*     Microbiology: Results for orders placed or performed during the hospital encounter of 12/25/20  Resp Panel by RT-PCR (Flu A&B, Covid) Nasopharyngeal Swab     Status: None   Collection Time: 12/25/20  8:46 AM   Specimen: Nasopharyngeal Swab; Nasopharyngeal(NP) swabs in vial transport medium  Result Value Ref Range Status   SARS Coronavirus 2 by RT PCR NEGATIVE NEGATIVE Final    Comment: (NOTE) SARS-CoV-2 target nucleic acids are NOT DETECTED.  The SARS-CoV-2 RNA is generally detectable in upper respiratory specimens during the acute phase of infection. The lowest concentration of SARS-CoV-2 viral copies this assay can detect is 138 copies/mL. A negative result does not preclude SARS-Cov-2 infection and should not be used as the sole basis for treatment or other patient management decisions. A negative result may occur with  improper specimen collection/handling, submission of specimen other than nasopharyngeal swab, presence of viral mutation(s) within the areas targeted by this assay, and inadequate number of viral copies(<138 copies/mL). A negative result must be combined with clinical observations, patient history, and epidemiological information. The expected result is Negative.  Fact Sheet for Patients:  EntrepreneurPulse.com.au  Fact  Sheet for Healthcare Providers:  IncredibleEmployment.be  This test is no t yet approved or cleared by the [place] FDA and  has been authorized for detection and/or diagnosis of SARS-CoV-2 by FDA under an Emergency Use Authorization (EUA). This EUA will remain  in effect (meaning this test can be used) for the duration of  the COVID-19 declaration under Section 564(b)(1) of the Act, 21 U.S.C.section 360bbb-3(b)(1), unless the authorization is terminated  or revoked sooner.       Influenza A by PCR NEGATIVE NEGATIVE Final   Influenza B by PCR NEGATIVE NEGATIVE Final    Comment: (NOTE) The Xpert Xpress SARS-CoV-2/FLU/RSV plus assay is intended as an aid in the diagnosis of influenza from Nasopharyngeal swab specimens and should not be used as a sole basis for treatment. Nasal washings and aspirates are unacceptable for Xpert Xpress SARS-CoV-2/FLU/RSV testing.  Fact Sheet for Patients: EntrepreneurPulse.com.au  Fact Sheet for Healthcare Providers: IncredibleEmployment.be  This test is not yet approved or cleared by the [place] FDA and has been authorized for detection and/or diagnosis of SARS-CoV-2 by FDA under an Emergency Use Authorization (EUA). This EUA will remain in effect (meaning this test can be used) for the duration of the COVID-19 declaration under Section 564(b)(1) of the Act, 21 U.S.C. section 360bbb-3(b)(1), unless the authorization is terminated or revoked.  Performed at [facility], [address] [place], [place] [address]     Coagulation Studies: No results for input(s): LABPROT, INR in the last 72 hours.  Urinalysis: No results for input(s): COLORURINE, LABSPEC, PHURINE, GLUCOSEU, HGBUR, BILIRUBINUR, KETONESUR, PROTEINUR, UROBILINOGEN, NITRITE, LEUKOCYTESUR in the last 72 hours.  Invalid input(s): APPERANCEUR    Imaging: No results found.   Medications:    sodium chloride      amiodarone  200 mg Oral Daily   Chlorhexidine Gluconate Cloth  6 each Topical Q0600   cholecalciferol  1,000 Units Oral Q breakfast   clopidogrel  75 mg Oral Q breakfast   ezetimibe  10 mg Oral QAC breakfast   ferrous sulfate  325 mg Oral BID WC   heparin  5,000 Units Subcutaneous Q8H   insulin aspart  0-6 Units Subcutaneous TID WC    levothyroxine  75 mcg Oral QAC breakfast   loratadine  10 mg Oral q morning   metoprolol succinate  12.5 mg Oral QHS   midodrine  10 mg Oral QID   multivitamin  1 tablet Oral Daily   omega-3 acid ethyl esters  4,000 mg Oral Q lunch   polyethylene glycol  17 g Oral Daily   rosuvastatin  40 mg Oral QHS   senna-docusate  2 tablet Oral BID   sodium chloride flush  3 mL Intravenous Q12H   sodium chloride, bisacodyl, fluticasone, HYDROcodone-acetaminophen, nitroGLYCERIN, ondansetron **OR** ondansetron (ZOFRAN) IV, sodium chloride flush, traMADol  Assessment/ Plan:  [name] is a 87 y.o. white male with end stage renal disease on hemodialysis, hypertension, atrial fibrillation, coronary artery disease status post CABG, diabetes mellitus type II, hypothyroidism, who is admitted to [facility] on 12/25/2020 for Fall [W19.XXXA] Closed fracture of proximal end of right humerus, unspecified fracture morphology, initial encounter [S42.201A] Hypotension [I95.9] Found to have a right humerus fracture.   [facility] TTS [address] RIJ permcath 98.5kg  End Stage Renal Disease: TTS, Received dialysis yesterday, no UF. Tolerated well. Next treatment scheduled for Saturday  Dialysis coordinator arranging outpatient clinic based on rehab situation. Bed offer in [facility] with [facility] chair time of MWF  at 730.   Hypotension:  Home regimen of midodrine before dialysis and for hypertension he takes metoprolol and furosemide. Furosemide used for volume control and will be transitioned to non-dialysis days. Continue to hold Furosemide.  Continues to complain of dizziness when standing. Will order Midodrine 10mg  to be given prior to working with PT, and if SBP<100, give an additional 10mg  of Midodrine.   Anemia of chronic kidney disease: hemoglobin at goal 9.6.   Secondary Hyperparathyroidism: outpatient PTH on 7/12 was low at 134. Not currently on binders.  Phosphorus remains at goal. Hold vitamin D agents.      LOS: 7 Jessie 8/12/20223:09 PM

## 2021-01-02 NOTE — Care Management Important Message (Signed)
Important Message  Patient Details  Name: Matthew [name] MRN: [id number] Date of Birth: 1931/03/05   Medicare Important Message Given:  Yes     Dannette Barbara 01/02/2021, 11:09 AM

## 2021-01-02 NOTE — Progress Notes (Signed)
Working on securing a seat at [facility] for MWF 11:15, waiting on insurance verification and MD acceptance. Patient may be able to start on Wednesday 8/17.

## 2021-01-02 NOTE — TOC Progression Note (Addendum)
Transition of Care [facility]) - Progression Note    Patient Details  Name: Matthew [name] MRN: [id number] Date of Birth: 28-Jan-1931  Transition of Care [facility] CM/SW [place], LCSW Phone Number: 01/02/2021, 9:29 AM  Clinical Narrative:   Reached out to HD Coordinator Estill Bamberg who stated she is waiting on a chair to be secured at new HD center. She will keep TOC updated. Patient cannot DC to SNF until this is confirmed. Per MD patient not medically ready today.   10:17- Spoke to HD Coordinator who stated the 7:30 chair time is no longer available, but 11:15 is. Called Admissions Worker Helene Kelp at [facility] who stated 11:15 chair time MWF [facility] is fine. [facility] cannot accept admissions over the weekend, but can take patient on Monday if medically ready and if dialysis insurance has been approved. Updated Estill Bamberg.   Expected Discharge Plan: [place] Barriers to Discharge: Continued Medical Work up  Expected Discharge Plan and Services Expected Discharge Plan: [place] Choice: [place] arrangements for the past 2 months: Single Family Home                                       Social Determinants of Health (SDOH) Interventions    Readmission Risk Interventions No flowsheet data found.

## 2021-01-02 NOTE — Progress Notes (Signed)
Physical Therapy Treatment Patient Details Name: Matthew [name] MRN: [id number] DOB: 1930-12-30 Today's Date: 01/02/2021    History of Present Illness Matthew [name] is a 87 y.o. male with medical history significant for diabetes mellitus with complications of end-stage renal disease on hemodialysis (dialysis days are Tuesday/Thursday/Saturday), coronary artery disease status post CABG, hypertension who presents to the ER for evaluation after he fell this morning while getting ready to go to dialysis.  Patient states that he leaned over to pick up a sock when he lost his balance and fell landing on his right shoulder.  He denied feeling dizzy or lightheaded and did not hit his head.  There was no loss of consciousness.    PT Comments    Pt was in chair position in bed with daughter and MD in room. He agrees to session and is cooperative throughout. Session is greatly limited by orthostatic hypotension with pt reporting symptoms. BP at rest prior to OOB. 118/65(81), sitting EOB 109/57 (75), standing BP drops and pt endorses severe dizziness requiring return to supine. BP in standing 64/38 (48). Once returned to supine 116/61. Pt performed several exercises prior to getting up to recliner. MD and RN aware of pt's limitations and BP concerns. MD to adjust medicine. RN will inform PT/OT when more medicine is given later in the day.    Follow Up Recommendations  SNF     Equipment Recommendations  None recommended by PT       Precautions / Restrictions Precautions Precautions: Fall Type of Shoulder Precautions: R humerus fx- per MD note keep RUE immobilized Precaution Comments: orthostatic hypotensive Required Braces or Orthoses: Sling;Other Brace Other Brace: shoulder immobilizer Restrictions Weight Bearing Restrictions: Yes RUE Weight Bearing: Non weight bearing    Mobility  Bed Mobility Overal bed mobility: Needs Assistance Bed Mobility: Supine to Sit;Sit to Supine     Supine to  sit: Min guard Sit to supine: Min assist        Transfers Overall transfer level: Needs assistance Equipment used: 1 person hand held assist Transfers: Sit to/from Stand Sit to Stand: Min assist;From elevated surface;Mod assist         General transfer comment: Stood with HHA +1. Unable to remain standing due to hypotension with symptoms  Ambulation/Gait      General Gait Details: did take 3 steps to recliner to promote upright in hopes of helping with orthostatic hypotension    Balance Overall balance assessment: History of Falls Sitting-balance support: Single extremity supported;Feet supported Sitting balance-Leahy Scale: Good     Standing balance support: Single extremity supported Standing balance-Leahy Scale: Fair Standing balance comment: does have unsteadiness even with static standing      Cognition Arousal/Alertness: Awake/alert Behavior During Therapy: Flat affect Overall Cognitive Status: Within Functional Limits for tasks assessed      General Comments: Pt is A and O and cooperative. continues to be limited by orthostatic hypotension             Pertinent Vitals/Pain Pain Assessment: 0-10 Pain Score: 2  Pain Location: RUE Pain Descriptors / Indicators: Constant Pain Intervention(s): Limited activity within patient's tolerance;Monitored during session;Premedicated before session;Repositioned (midodrine issued thirty minutes prior)     PT Goals (current goals can now be found in the care plan section) Acute Rehab PT Goals Patient Stated Goal: none stated Progress towards PT goals: Progressing toward goals    Frequency    7X/week      PT Plan Current plan remains appropriate  Co-evaluation     PT goals addressed during session: Mobility/safety with mobility;Balance;Proper use of DME;Strengthening/ROM        AM-PAC PT "6 Clicks" Mobility   Outcome Measure  Help needed turning from your back to your side while in a flat bed  without using bedrails?: A Little Help needed moving from lying on your back to sitting on the side of a flat bed without using bedrails?: A Little Help needed moving to and from a bed to a chair (including a wheelchair)?: A Little Help needed standing up from a chair using your arms (e.g., wheelchair or bedside chair)?: A Little Help needed to walk in hospital room?: A Lot Help needed climbing 3-5 steps with a railing? : A Lot 6 Click Score: 16    End of Session Equipment Utilized During Treatment: Gait belt Activity Tolerance: Treatment limited secondary to medical complications (Comment) (severely limited by orthostatic hypotension) Patient left: in chair;with call bell/phone within reach;with chair alarm set;with nursing/sitter in room;with family/visitor present Nurse Communication: Mobility status;Other (comment) (MD to adjust medicine prior to next session) PT Visit Diagnosis: Unsteadiness on feet (R26.81);Repeated falls (R29.6);Muscle weakness (generalized) (M62.81);History of falling (Z91.81);Difficulty in walking, not elsewhere classified (R26.2)     Time: 5427-0623 PT Time Calculation (min) (ACUTE ONLY): 20 min  Charges:  $Therapeutic Activity: 8-22 mins                     Julaine Fusi PTA 01/02/21, 9:44 AM

## 2021-01-02 NOTE — Progress Notes (Signed)
Occupational Therapy Treatment Patient Details Name: Matthew [name] MRN: [id number] DOB: 03/26/1931 Today's Date: 01/02/2021    History of present illness Matthew [name] is a 87 y.o. male with medical history significant for diabetes mellitus with complications of end-stage renal disease on hemodialysis (dialysis days are Tuesday/Thursday/Saturday), coronary artery disease status post CABG, hypertension who presents to the ER for evaluation after he fell this morning while getting ready to go to dialysis.  Patient states that he leaned over to pick up a sock when he lost his balance and fell landing on his right shoulder.  He denied feeling dizzy or lightheaded and did not hit his head.  There was no loss of consciousness.   OT comments  Matthew [name] was seen for OT treatment on this date. Upon arrival to room pt reclined in chair, eager for return to bed. Pt requires initial MOD A standing from chair height, second trial MIN A and HHA for bed>chair. Pt c/o back/RUE pain, left in sidelying c RN aware. Orthostatics taken with no dizziness reported. Pt making good progress toward goals. Pt continues to benefit from skilled OT services to maximize return to PLOF and minimize risk of future falls, injury, caregiver burden, and readmission. Will continue to follow POC. Discharge recommendation remains appropriate.   Orthostatic Vitals  Sitting: BP 118/53, MAP 73, HR 59 Standing: BP 77/48, MAP 59, HR 79 - Denies dizziness 3 min Standing: BP 91/49, MAP 63, HR 81 Sitting: BP 126/54, MAP 74, HR 65   Follow Up Recommendations  SNF    Equipment Recommendations  Other (comment) (tbd next level of care)    Recommendations for Other Services      Precautions / Restrictions Precautions Precautions: Fall Type of Shoulder Precautions: R humerus fx- per MD note keep RUE immobilized Precaution Comments: orthostatic hypotensive Required Braces or Orthoses: Sling;Other Brace Other Brace: shoulder  immobilizer Restrictions Weight Bearing Restrictions: Yes RUE Weight Bearing: Non weight bearing       Mobility Bed Mobility Overal bed mobility: Needs Assistance Bed Mobility: Supine to Sit       Sit to supine: Min assist        Transfers Overall transfer level: Needs assistance Equipment used: 1 person hand held assist Transfers: Sit to/from [facility] Sit to Stand: Mod assist Stand pivot transfers: Min assist       General transfer comment: initial MOD A standing from chair height. second trial MIN A and HHA for bed>chair    Balance Overall balance assessment: Needs assistance Sitting-balance support: Single extremity supported;Feet supported Sitting balance-Leahy Scale: Good     Standing balance support: Single extremity supported;During functional activity Standing balance-Leahy Scale: Fair Standing balance comment: fair reaching inside BOS                           ADL either performed or assessed with clinical judgement   ADL Overall ADL's : Needs assistance/impaired                                       General ADL Comments: MOD A + HHA for ADL t/f. SETUP + SBA seated self-drinking      Cognition Arousal/Alertness: Awake/alert Behavior During Therapy: Flat affect Overall Cognitive Status: Within Functional Limits for tasks assessed  General Comments: pleasant and cooperative        Exercises Exercises: Other exercises Other Exercises Other Exercises: Pt and family educated re: DME recs, d/c recs, falls prevention Other Exercises: self-drinking, sit<>stand x2, SPT, sitting/standing balance/tolerance           Pertinent Vitals/ Pain       Pain Assessment: Faces Faces Pain Scale: Hurts even more Pain Location: RUE, back Pain Descriptors / Indicators: Constant Pain Intervention(s): Limited activity within patient's tolerance;Repositioned;Patient  requesting pain meds-RN notified   Frequency  Min 2X/week        Progress Toward Goals  OT Goals(current goals can now be found in the care plan section)  Progress towards OT goals: Progressing toward goals  Acute Rehab OT Goals Patient Stated Goal: none stated OT Goal Formulation: With patient Time For Goal Achievement: 01/12/21 Potential to Achieve Goals: Good ADL Goals Pt Will Perform Grooming: with modified independence;sitting Pt Will Perform Upper Body Dressing: with modified independence;sitting Pt Will Perform Toileting - Clothing Manipulation and hygiene: with min guard assist Pt Will Perform Tub/Shower Transfer: with min guard assist  Plan Discharge plan remains appropriate;Frequency remains appropriate    Co-evaluation                 AM-PAC OT "6 Clicks" Daily Activity     Outcome Measure   Help from another person eating meals?: A Little Help from another person taking care of personal grooming?: A Little Help from another person toileting, which includes using toliet, bedpan, or urinal?: A Little Help from another person bathing (including washing, rinsing, drying)?: A Little Help from another person to put on and taking off regular upper body clothing?: A Lot Help from another person to put on and taking off regular lower body clothing?: A Lot 6 Click Score: 16    End of Session Equipment Utilized During Treatment: Gait belt  OT Visit Diagnosis: Unsteadiness on feet (R26.81);Other abnormalities of gait and mobility (R26.89);Repeated falls (R29.6);Muscle weakness (generalized) (M62.81)   Activity Tolerance Patient tolerated treatment well   Patient Left in bed;with call bell/phone within reach;with bed alarm set;with family/visitor present   Nurse Communication Mobility status;Patient requests pain meds        Time: 7253-6644 OT Time Calculation (min): 25 min  Charges: OT General Charges $OT Visit: 1 Visit OT Treatments $Self Care/Home  Management : 8-22 mins $Therapeutic Activity: 8-22 mins  Dessie Coma, M.S. OTR/L  01/02/21, 1:43 PM  ascom [phone]

## 2021-01-03 DIAGNOSIS — I480 Paroxysmal atrial fibrillation: Secondary | ICD-10-CM

## 2021-01-03 LAB — CORTISOL-AM, BLOOD: Cortisol - AM: 11.5 ug/dL (ref 6.7–22.6)

## 2021-01-03 LAB — GLUCOSE, CAPILLARY
Glucose-Capillary: 103 mg/dL — ABNORMAL HIGH (ref 70–99)
Glucose-Capillary: 116 mg/dL — ABNORMAL HIGH (ref 70–99)

## 2021-01-03 LAB — RENAL FUNCTION PANEL
Albumin: 3 g/dL — ABNORMAL LOW (ref 3.5–5.0)
Anion gap: 13 (ref 5–15)
BUN: 52 mg/dL — ABNORMAL HIGH (ref 8–23)
CO2: 25 mmol/L (ref 22–32)
Calcium: 8.8 mg/dL — ABNORMAL LOW (ref 8.9–10.3)
Chloride: 95 mmol/L — ABNORMAL LOW (ref 98–111)
Creatinine, Ser: 3.42 mg/dL — ABNORMAL HIGH (ref 0.61–1.24)
GFR, Estimated: 17 mL/min — ABNORMAL LOW (ref >60)
Glucose, Bld: 110 mg/dL — ABNORMAL HIGH (ref 70–99)
Phosphorus: 4.4 mg/dL (ref 2.5–4.6)
Potassium: 4.2 mmol/L (ref 3.5–5.1)
Sodium: 133 mmol/L — ABNORMAL LOW (ref 135–145)

## 2021-01-03 MED ORDER — HEPARIN SODIUM (PORCINE) 1000 UNIT/ML IJ SOLN
4200.0000 [IU] | Freq: Once | INTRAMUSCULAR | Status: AC
  Administered 2021-01-03: 4200 [IU]

## 2021-01-03 MED ORDER — COSYNTROPIN 0.25 MG IJ SOLR
0.2500 mg | Freq: Once | INTRAMUSCULAR | Status: AC
  Administered 2021-01-04: 0.25 mg via INTRAVENOUS
  Filled 2021-01-03: qty 0.25

## 2021-01-03 MED ORDER — HEPARIN SODIUM (PORCINE) 1000 UNIT/ML IJ SOLN
INTRAMUSCULAR | Status: AC
  Filled 2021-01-03: qty 1

## 2021-01-03 NOTE — Progress Notes (Addendum)
Patient ID: Matthew [name], male   DOB: October 27, 1930, 87 y.o.   MRN: [id number] [facility] PROGRESS NOTE  Matthew [name] NOB:[id number] DOB: 1930-08-12 DOA: 12/25/2020 PCP: Jodi Marble, MD  HPI/Subjective: Patient not feeling great.  Had dialysis today.  Still feels weak.  Has not gotten up much.  Received midodrine after dialysis.  Soreness on his back.  Also developed a buttock sore.  Initially came in with fall and right humerus fracture.  Objective: Vitals:   01/03/21 1300 01/03/21 1343  BP: (!) 141/69 (!) 115/48  Pulse: 61 (!) 59  Resp:    Temp:  98.2 F (36.8 C)  SpO2: 100% 98%    Intake/Output Summary (Last 24 hours) at 01/03/2021 1447 Last data filed at 01/03/2021 1258 Gross per 24 hour  Intake 240 ml  Output 300 ml  Net -60 ml   Filed Weights   12/25/20 0627  Weight: 97.5 kg    ROS: Review of Systems  Respiratory:  Negative for shortness of breath.   Cardiovascular:  Negative for chest pain.  Gastrointestinal:  Negative for abdominal pain.  Musculoskeletal:  Positive for joint pain.  Exam: Physical Exam HENT:     Head: Normocephalic.     Mouth/Throat:     Pharynx: No oropharyngeal exudate.  Eyes:     General: Lids are normal.     Conjunctiva/sclera: Conjunctivae normal.  Cardiovascular:     Rate and Rhythm: Normal rate and regular rhythm.     Heart sounds: Normal heart sounds, S1 normal and S2 normal.  Pulmonary:     Breath sounds: Normal breath sounds. No decreased breath sounds, wheezing, rhonchi or rales.  Abdominal:     Palpations: Abdomen is soft.     Tenderness: There is no abdominal tenderness.  Musculoskeletal:     Right lower leg: Swelling present.     Left lower leg: Swelling present.  Skin:    General: Skin is warm.     Comments: Stage II decubitus buttock. Dried shingle rash right back  Neurological:     Mental Status: He is alert and oriented to person, place, and time.      Scheduled Meds:  amiodarone  200 mg Oral Daily    Chlorhexidine Gluconate Cloth  6 each Topical Q0600   cholecalciferol  1,000 Units Oral Q breakfast   clopidogrel  75 mg Oral Q breakfast   [START ON 01/04/2021] cosyntropin  0.25 mg Intravenous Once   ezetimibe  10 mg Oral QAC breakfast   ferrous sulfate  325 mg Oral BID WC   heparin  5,000 Units Subcutaneous Q8H   insulin aspart  0-6 Units Subcutaneous TID WC   levothyroxine  75 mcg Oral QAC breakfast   loratadine  10 mg Oral q morning   metoprolol succinate  12.5 mg Oral QHS   midodrine  10 mg Oral QID   multivitamin  1 tablet Oral Daily   omega-3 acid ethyl esters  4,000 mg Oral Q lunch   polyethylene glycol  17 g Oral Daily   rosuvastatin  40 mg Oral QHS   senna-docusate  2 tablet Oral BID   sodium chloride flush  3 mL Intravenous Q12H   Continuous Infusions:  sodium chloride      Assessment/Plan:  Orthostatic hypotension.  Continue midodrine 4 times a day.  A.m. cortisol in the indeterminate range we will get a cosyntropin test tomorrow and ACTH.  TED hose. Proximal right humerus fracture.  Follow-up with Dr. Harlow Mares as outpatient  continue mobilization. Chronic systolic congestive heart failure.  Dialysis to manage fluid with blood pressure being on the lower side. Paroxysmal atrial fibrillation on metoprolol and amiodarone. Hypothyroidism unspecified on levothyroxine History of CAD on metoprolol, Plavix, Crestor and Zetia Type 2 diabetes mellitus with end-stage renal disease.  Continue dialysis Tuesday Thursday and Saturday but will be switched over to Monday Wednesday Friday at rehab so likely will get dialysis again on Monday.  Holding glipizide at this point.  Sugars are acceptable. Stage II decubitus on buttocks.  Protective foam dressing.        Code Status:     Code Status Orders  (From admission, onward)           Start     Ordered   12/25/20 1343  Do not attempt resuscitation (DNR)  Continuous       Question Answer Comment  In the event of cardiac or  respiratory ARREST Do not call a "code blue"   In the event of cardiac or respiratory ARREST Do not perform Intubation, CPR, defibrillation or ACLS   In the event of cardiac or respiratory ARREST Use medication by any route, position, wound care, and other measures to relive pain and suffering. May use oxygen, suction and manual treatment of airway obstruction as needed for comfort.   Comments Code status was discussed with patient and he is a DNR      12/25/20 1342           Code Status History     Date Active Date Inactive Code Status Order ID Comments User Context   11/01/2020 1643 11/05/2020 2001 DNR [id number]  Kayleen Memos, DO ED   11/01/2020 1554 11/01/2020 1643 Full Code [id number]  Kayleen Memos, DO ED   11/01/2020 1553 11/01/2020 1554 Full Code [id number]  Kayleen Memos, DO ED   05/24/2019 0431 05/26/2019 1925 Full Code [id number]  Mansy, Arvella Merles, MD ED   02/15/2018 1910 02/17/2018 1529 DNR [id number]  Saundra Shelling, MD Inpatient   01/19/2018 0104 01/20/2018 1812 DNR [id number]  Amelia Jo, MD Inpatient   01/10/2018 1248 01/15/2018 1546 DNR [id number]  Loletha Grayer, MD Inpatient   01/10/2018 1224 01/10/2018 1248 Full Code [id number]  Loletha Grayer, MD Inpatient   12/09/2017 1835 12/11/2017 1508 Full Code [id number]  Loletha Grayer, MD Inpatient   12/06/2013 1125 12/07/2013 1536 Full Code [id number]  Belva Crome, MD Inpatient   12/04/2013 0955 12/06/2013 1125 Full Code [id number]  Leonie Man, MD Inpatient   12/03/2013 0407 12/04/2013 0955 Full Code [id number]  Rise Patience, MD Inpatient      Advance Directive Documentation    Flowsheet Row Most Recent Value  Type of Advance Directive Healthcare Power of Attorney, Living will  Pre-existing out of facility DNR order (yellow form or pink MOST form) --  "MOST" Form in Place? --      Family Communication: Spoke with daughter at the bedside Disposition Plan: Status is: Inpatient  Dispo: The patient is from: Home               Anticipated d/c is to: Rehab              Patient currently will go out to rehab once dialysis set up and blood pressure improved with standing.   Difficult to place patient.  No.  Time spent: 27 minutes  [place]

## 2021-01-03 NOTE — Progress Notes (Signed)
[facility]  ROUNDING NOTE   Subjective:   [name] was admitted to [facility] on 12/25/2020 for Fall [W19.XXXA] Closed fracture of proximal end of right humerus, unspecified fracture morphology, initial encounter [S42.201A] Hypotension [I95.9]  Patient seen during dialysis Patient tolerating treatment well    Objective:  Vital signs in last 24 hours:  Temp:  [97.7 F (36.5 C)-98.8 F (37.1 C)] 98.2 F (36.8 C) (08/13 1343) Pulse Rate:  [49-66] 59 (08/13 1343) Resp:  [16-22] 17 (08/13 1145) BP: (101-141)/(48-69) 115/48 (08/13 1343) SpO2:  [88 %-100 %] 98 % (08/13 1343)  Weight change:  Filed Weights   12/25/20 0627  Weight: 97.5 kg    Intake/Output: I/O last 3 completed shifts: In: 720 [P.O.:720] Out: 475 [Urine:475]   Intake/Output this shift:  No intake/output data recorded.  Physical Exam: General: NAD, sitting in chair  Head: Normocephalic, atraumatic. Moist oral mucosal membranes  Eyes: Anicteric  Lungs:  Clear to auscultation,normal effort  Heart: Regular rate and rhythm  Abdomen:  Soft, nontender  Extremities: No peripheral edema.  Neurologic: Nonfocal, moving all four extremities  Skin: No lesions  Access: RIJ permcath    Basic Metabolic Panel: Recent Labs  Lab 12/28/20 0900 01/01/21 0452 01/03/21 0459  NA 135 133* 133*  K 4.4 4.6 4.2  CL 97* 99 95*  CO2 29 25 25   GLUCOSE 142* 139* 110*  BUN 34* 53* 52*  CREATININE 3.29* 3.47* 3.42*  CALCIUM 8.7* 8.8* 8.8*  PHOS  --  4.4 4.4    Liver Function Tests: Recent Labs  Lab 01/03/21 0459  ALBUMIN 3.0*   No results for input(s): LIPASE, AMYLASE in the last 168 hours. No results for input(s): AMMONIA in the last 168 hours.  CBC: Recent Labs  Lab 12/28/20 0900 01/01/21 0452  WBC 7.5 6.9  HGB 9.5* 9.6*  HCT 27.9* 28.0*  MCV 96.9 94.6  PLT 133* 163    Cardiac Enzymes: No results for input(s): CKTOTAL, CKMB, CKMBINDEX, TROPONINI in the last 168  hours.  BNP: Invalid input(s): POCBNP  CBG: Recent Labs  Lab 01/02/21 0809 01/02/21 1203 01/02/21 1645 01/02/21 2056 01/03/21 0754  GLUCAP 132* 135* 137* 156* 103*    Microbiology: Results for orders placed or performed during the hospital encounter of 12/25/20  Resp Panel by RT-PCR (Flu A&B, Covid) Nasopharyngeal Swab     Status: None   Collection Time: 12/25/20  8:46 AM   Specimen: Nasopharyngeal Swab; Nasopharyngeal(NP) swabs in vial transport medium  Result Value Ref Range Status   SARS Coronavirus 2 by RT PCR NEGATIVE NEGATIVE Final    Comment: (NOTE) SARS-CoV-2 target nucleic acids are NOT DETECTED.  The SARS-CoV-2 RNA is generally detectable in upper respiratory specimens during the acute phase of infection. The lowest concentration of SARS-CoV-2 viral copies this assay can detect is 138 copies/mL. A negative result does not preclude SARS-Cov-2 infection and should not be used as the sole basis for treatment or other patient management decisions. A negative result may occur with  improper specimen collection/handling, submission of specimen other than nasopharyngeal swab, presence of viral mutation(s) within the areas targeted by this assay, and inadequate number of viral copies(<138 copies/mL). A negative result must be combined with clinical observations, patient history, and epidemiological information. The expected result is Negative.  Fact Sheet for Patients:  EntrepreneurPulse.com.au  Fact Sheet for Healthcare Providers:  IncredibleEmployment.be  This test is no t yet approved or cleared by the [place] FDA and  has been  authorized for detection and/or diagnosis of SARS-CoV-2 by FDA under an Emergency Use Authorization (EUA). This EUA will remain  in effect (meaning this test can be used) for the duration of the COVID-19 declaration under Section 564(b)(1) of the Act, 21 U.S.C.section 360bbb-3(b)(1), unless the  authorization is terminated  or revoked sooner.       Influenza A by PCR NEGATIVE NEGATIVE Final   Influenza B by PCR NEGATIVE NEGATIVE Final    Comment: (NOTE) The Xpert Xpress SARS-CoV-2/FLU/RSV plus assay is intended as an aid in the diagnosis of influenza from Nasopharyngeal swab specimens and should not be used as a sole basis for treatment. Nasal washings and aspirates are unacceptable for Xpert Xpress SARS-CoV-2/FLU/RSV testing.  Fact Sheet for Patients: EntrepreneurPulse.com.au  Fact Sheet for Healthcare Providers: IncredibleEmployment.be  This test is not yet approved or cleared by the [place] FDA and has been authorized for detection and/or diagnosis of SARS-CoV-2 by FDA under an Emergency Use Authorization (EUA). This EUA will remain in effect (meaning this test can be used) for the duration of the COVID-19 declaration under Section 564(b)(1) of the Act, 21 U.S.C. section 360bbb-3(b)(1), unless the authorization is terminated or revoked.  Performed at [facility], [address] [place], [place] [address]     Coagulation Studies: No results for input(s): LABPROT, INR in the last 72 hours.  Urinalysis: No results for input(s): COLORURINE, LABSPEC, PHURINE, GLUCOSEU, HGBUR, BILIRUBINUR, KETONESUR, PROTEINUR, UROBILINOGEN, NITRITE, LEUKOCYTESUR in the last 72 hours.  Invalid input(s): APPERANCEUR    Imaging: No results found.   Medications:    sodium chloride      amiodarone  200 mg Oral Daily   Chlorhexidine Gluconate Cloth  6 each Topical Q0600   cholecalciferol  1,000 Units Oral Q breakfast   clopidogrel  75 mg Oral Q breakfast   [START ON 01/04/2021] cosyntropin  0.25 mg Intravenous Once   ezetimibe  10 mg Oral QAC breakfast   ferrous sulfate  325 mg Oral BID WC   heparin  5,000 Units Subcutaneous Q8H   insulin aspart  0-6 Units Subcutaneous TID WC   levothyroxine  75 mcg Oral QAC breakfast    loratadine  10 mg Oral q morning   metoprolol succinate  12.5 mg Oral QHS   midodrine  10 mg Oral QID   multivitamin  1 tablet Oral Daily   omega-3 acid ethyl esters  4,000 mg Oral Q lunch   polyethylene glycol  17 g Oral Daily   rosuvastatin  40 mg Oral QHS   senna-docusate  2 tablet Oral BID   sodium chloride flush  3 mL Intravenous Q12H   sodium chloride, bisacodyl, fluticasone, HYDROcodone-acetaminophen, nitroGLYCERIN, ondansetron **OR** ondansetron (ZOFRAN) IV, sodium chloride flush, traMADol  Assessment/ Plan:  [name] is a 87 y.o. white male with end stage renal disease on hemodialysis, hypertension, atrial fibrillation, coronary artery disease status post CABG, diabetes mellitus type II, hypothyroidism, who is admitted to [facility] on 12/25/2020 for Fall [W19.XXXA] Closed fracture of proximal end of right humerus, unspecified fracture morphology, initial encounter [S42.201A] Hypotension [I95.9] Found to have a right humerus fracture.   [facility] TTS [address] RIJ permcath 98.5kg       1)Renal    End-stage renal disease Patient is on hemodialysis Patient is on Tuesday Thursday Saturday schedule Patient currently being dialyzed Dialysis coordinator is arranging outpatient clinic based on the patient's rehab station There is currently a bed offer in [place] with facilities with a  chair time of Monday Wednesday Friday 7:30 AM  2) hypotension  Patient is on midodrine  Blood pressure is stable    3)Anemia of chronic disease  CBC Latest Ref Rng & Units 01/01/2021 12/28/2020 12/25/2020  WBC 4.0 - 10.5 K/uL 6.9 7.5 7.8  Hemoglobin 13.0 - 17.0 g/dL 9.6(L) 9.5(L) 11.5(L)  Hematocrit 39.0 - 52.0 % 28.0(L) 27.9(L) 34.0(L)  Platelets 150 - 400 K/uL 163 133(L) 136(L)       HGb at goal (9--11)   4) Secondary hyperparathyroidism -CKD Mineral-Bone Disorder    Lab Results  Component Value Date   CALCIUM 8.8 (L) 01/03/2021   PHOS 4.4 01/03/2021    Patient has  a history of secondary hyperparathyroidism Patient last intact PTH was 134, below the target of 150 Patient is currently not on any vitamin D analogs  Phosphorus at goal.   5) diabetes mellitus type 2 Patient primary team is following   6) Electrolytes   BMP Latest Ref Rng & Units 01/03/2021 01/01/2021 12/28/2020  Glucose 70 - 99 mg/dL 110(H) 139(H) 142(H)  BUN 8 - 23 mg/dL 52(H) 53(H) 34(H)  Creatinine 0.61 - 1.24 mg/dL 3.42(H) 3.47(H) 3.29(H)  Sodium 135 - 145 mmol/L 133(L) 133(L) 135  Potassium 3.5 - 5.1 mmol/L 4.2 4.6 4.4  Chloride 98 - 111 mmol/L 95(L) 99 97(L)  CO2 22 - 32 mmol/L 25 25 29   Calcium 8.9 - 10.3 mg/dL 8.8(L) 8.8(L) 8.7(L)     Sodium Hyponatremia Secondary to ESRD Inability to get rid of free water   Potassium Normokalemic    7)Acid base    Co2 at goal    Plan   Patient currently being dialyzed. Sodium should improve with dialysis We will continue to follow   LOS: 8 Faiga Stones s [facility] 8/13/20223:10 PM

## 2021-01-03 NOTE — Progress Notes (Signed)
This patient arrived alert and denies complaints, afib on monitor on arrival and bradycardia in the high 40's and low 50's Dr. Theador Hawthorne was made aware pre hd and ok to initiate Hd with a net UF of zero as per orders. Pt denies symptoms at this time will continue to monitor

## 2021-01-03 NOTE — Progress Notes (Signed)
This patient had 3 hours of hemodialysis today and tolerated it with no complaints. Afib pre Hd and post Hd. MD aware as well as care RN. Net Uf of zero was achieved as per orders. Catheter site is benign, dressing clean dry intact and not due for changing today. Handoff report has been given to unit care RN

## 2021-01-03 NOTE — Progress Notes (Addendum)
PT Cancellation Note  Patient Details Name: Matthew [name] MRN: [id number] DOB: July 27, 1930   Cancelled Treatment:     PT attempt. Pt off floor for HD. Will continue to follow and progress as able per current POC. Will return tomorrow and continue to monitor BPs with mobility, transfers, ambulation.    Willette Pa 01/03/2021, 10:09 AM

## 2021-01-04 LAB — ACTH STIMULATION, 3 TIME POINTS
Cortisol, 30 Min: 18.4 ug/dL
Cortisol, 60 Min: 23.4 ug/dL
Cortisol, Base: 11.6 ug/dL

## 2021-01-04 LAB — GLUCOSE, CAPILLARY
Glucose-Capillary: 92 mg/dL (ref 70–99)
Glucose-Capillary: 139 mg/dL — ABNORMAL HIGH (ref 70–99)
Glucose-Capillary: 140 mg/dL — ABNORMAL HIGH (ref 70–99)
Glucose-Capillary: 182 mg/dL — ABNORMAL HIGH (ref 70–99)

## 2021-01-04 NOTE — Progress Notes (Signed)
Patient ID: Matthew [name], male   DOB: 12/14/1930, 87 y.o.   MRN: [id number] [facility] PROGRESS NOTE  Matthew [name] DUK:[id number] DOB: 1930/10/21 DOA: 12/25/2020 PCP: Jodi Marble, MD  HPI/Subjective: Patient sat up in the room when I was with him and did not have any dizziness or any symptoms.  Patient was admitted initially with a fall and found to have a right humerus fracture.  Patient has been orthostatic during the hospital course requiring midodrine up to 4 times a day.  Objective: Vitals:   01/04/21 0727 01/04/21 0924  BP: (!) 138/50 (!) 102/53  Pulse: (!) 57 62  Resp: 18 18  Temp: 98.5 F (36.9 C) 97.9 F (36.6 C)  SpO2: 100% 97%    Intake/Output Summary (Last 24 hours) at 01/04/2021 1436 Last data filed at 01/04/2021 1300 Gross per 24 hour  Intake 480 ml  Output --  Net 480 ml   Filed Weights   12/25/20 0627  Weight: 97.5 kg    ROS: Review of Systems  Respiratory:  Positive for cough and shortness of breath.   Cardiovascular:  Negative for chest pain.  Gastrointestinal:  Negative for abdominal pain.  Musculoskeletal:  Positive for joint pain.  Exam: Physical Exam HENT:     Head: Normocephalic.     Mouth/Throat:     Pharynx: No oropharyngeal exudate.  Eyes:     General: Lids are normal.     Conjunctiva/sclera: Conjunctivae normal.     Pupils: Pupils are equal, round, and reactive to light.  Cardiovascular:     Rate and Rhythm: Normal rate and regular rhythm.     Heart sounds: Normal heart sounds, S1 normal and S2 normal.  Pulmonary:     Breath sounds: Examination of the right-lower field reveals decreased breath sounds. Examination of the left-lower field reveals decreased breath sounds. Decreased breath sounds present. No wheezing, rhonchi or rales.  Abdominal:     Palpations: Abdomen is soft.     Tenderness: There is no abdominal tenderness.  Musculoskeletal:     Right lower leg: Swelling present.     Left lower leg: Swelling present.   Skin:    General: Skin is warm.     Comments: Right arm a lot of bruising seen right upper arm.  Dried shingles rash right back.  Neurological:     Mental Status: He is alert and oriented to person, place, and time.      Scheduled Meds:  amiodarone  200 mg Oral Daily   Chlorhexidine Gluconate Cloth  6 each Topical Q0600   cholecalciferol  1,000 Units Oral Q breakfast   clopidogrel  75 mg Oral Q breakfast   ezetimibe  10 mg Oral QAC breakfast   ferrous sulfate  325 mg Oral BID WC   heparin  5,000 Units Subcutaneous Q8H   insulin aspart  0-6 Units Subcutaneous TID WC   levothyroxine  75 mcg Oral QAC breakfast   loratadine  10 mg Oral q morning   metoprolol succinate  12.5 mg Oral QHS   midodrine  10 mg Oral QID   multivitamin  1 tablet Oral Daily   omega-3 acid ethyl esters  4,000 mg Oral Q lunch   polyethylene glycol  17 g Oral Daily   rosuvastatin  40 mg Oral QHS   senna-docusate  2 tablet Oral BID   sodium chloride flush  3 mL Intravenous Q12H   Continuous Infusions:  sodium chloride     Brief history.  Patient  admitted 12/25/2020 with fall and found to have a right humerus fracture.  Past medical history of congestive heart failure, end-stage renal disease, type 2 diabetes mellitus, arrhythmia history.  During the hospital course he has been very orthostatic requiring midodrine to be titrated up to 4 times a day.  Physical therapy recommending rehab.  Assessment/Plan:  Orthostatic hypotension.  Continue midodrine 4 times a day.  Patient was able to walk to the bathroom today with the nursing staff and his daughter.  Was still orthostatic with physical therapy today but not having symptoms.  Cosyntropin test cortisol 30 minutes went up to 18.4 and 60 minutes up to 23.4. Proximal right humerus fracture.  Follow-up with Dr. Harlow Mares as outpatient.  Continue immobilization right shoulder. Chronic systolic congestive heart failure.  Dialysis to manage fluid.  Unable to give Lasix at  this time with blood pressure being on the lower side. Paroxysmal atrial fibrillation on metoprolol and amiodarone.  We will get an EKG.  Spoke with patient and patient's daughter about potential increase in blood thinner but nervous at this point with the patient's orthostatic hypotension and falls. Hypothyroidism unspecified on levothyroxine History of CAD on metoprolol, Plavix, Crestor and Zetia Type 2 diabetes mellitus with end-stage renal disease.  Continue dialysis.  The rehab will switch patient's dialysis days over to Wednesday Friday and Monday.  Likely will need dialysis here tomorrow.  Holding glipizide at this point. Stage II decubitus on buttocks.  Protective foam dressing.  Continue mobilization. Weakness.  Physical therapy recommending rehab.        Code Status:     Code Status Orders  (From admission, onward)           Start     Ordered   12/25/20 1343  Do not attempt resuscitation (DNR)  Continuous       Question Answer Comment  In the event of cardiac or respiratory ARREST Do not call a "code blue"   In the event of cardiac or respiratory ARREST Do not perform Intubation, CPR, defibrillation or ACLS   In the event of cardiac or respiratory ARREST Use medication by any route, position, wound care, and other measures to relive pain and suffering. May use oxygen, suction and manual treatment of airway obstruction as needed for comfort.   Comments Code status was discussed with patient and he is a DNR      12/25/20 1342           Code Status History     Date Active Date Inactive Code Status Order ID Comments User Context   11/01/2020 1643 11/05/2020 2001 DNR [id number]  Kayleen Memos, DO ED   11/01/2020 1554 11/01/2020 1643 Full Code [id number]  Kayleen Memos, DO ED   11/01/2020 1553 11/01/2020 1554 Full Code [id number]  Kayleen Memos, DO ED   05/24/2019 0431 05/26/2019 1925 Full Code [id number]  Mansy, Arvella Merles, MD ED   02/15/2018 1910 02/17/2018 1529 DNR [id number]   Saundra Shelling, MD Inpatient   01/19/2018 0104 01/20/2018 1812 DNR [id number]  Amelia Jo, MD Inpatient   01/10/2018 1248 01/15/2018 1546 DNR [id number]  Loletha Grayer, MD Inpatient   01/10/2018 1224 01/10/2018 1248 Full Code [id number]  Loletha Grayer, MD Inpatient   12/09/2017 1835 12/11/2017 1508 Full Code [id number]  Loletha Grayer, MD Inpatient   12/06/2013 1125 12/07/2013 1536 Full Code [id number]  Belva Crome, MD Inpatient   12/04/2013 0955 12/06/2013 1125 Full Code [id number]  Leonie Man, MD Inpatient  12/03/2013 0407 12/04/2013 0955 Full Code [id number]  Rise Patience, MD Inpatient      Advance Directive Documentation    Flowsheet Row Most Recent Value  Type of Advance Directive Healthcare Power of Attorney, Living will  Pre-existing out of facility DNR order (yellow form or pink MOST form) --  "MOST" Form in Place? --      Family Communication: Updated daughter on the phone Disposition Plan: Status is: Inpatient  Dispo: The patient is from: Home              Anticipated d/c is to: Rehab              Patient currently doing better with regards to symptoms when standing up.  Was able to walk to the bathroom with nursing staff and daughter today.   Difficult to place patient.  No.  Consultants: Nephrology  Time spent: 27 minutes  [place]

## 2021-01-04 NOTE — Progress Notes (Signed)
Physical Therapy Treatment Patient Details Name: Matthew [name] MRN: [id number] DOB: [birth year] Today's Date: 01/04/2021    History of Present Illness Matthew [name] is a 87 y.o. male with medical history significant for diabetes mellitus with complications of end-stage renal disease on hemodialysis (dialysis days are Tuesday/Thursday/Saturday), coronary artery disease status post CABG, hypertension who presents to the ER for evaluation after he fell this morning while getting ready to go to dialysis.  Patient states that he leaned over to pick up a sock when he lost his balance and fell landing on his right shoulder.  He denied feeling dizzy or lightheaded and did not hit his head.  There was no loss of consciousness.    PT Comments    Ted hose applied prior to OOB per discussion with MD.  Pt had Midodrine 1 hour prior to session. EOB with supervision. BP sitting 112/58 P 53 Standing 136/87 Standing 3 minutes 86/49 Sitting 128/64 P 58 Of note HR read 130 during standing BP but when rec-checked had returned to 50's.  Pt does have A-fib.  He continues to deny dizziness but does request to sit after about 4 minutes in standing for vitals and marching in place with cane due to feeling "weak".  After short seated rest, he is able to stand again, transfer to recliner at bedside and when fatigued he request to sit.  He is left sitting in recliner for lunch.  Daughter stated he tolerated sitting about 1 1/2 hours yesterday before fatigue.  Will continue to goals and mobility.  Will progress gait tomorrow as tolerated when +2 assist is more readily available for safety given continued orthostatic BP.   Follow Up Recommendations  SNF     Equipment Recommendations  None recommended by PT    Recommendations for Other Services       Precautions / Restrictions Precautions Precautions: Fall Type of Shoulder Precautions: R humerus fx- per MD note keep RUE immobilized Precaution Comments:  orthostatic hypotensive Required Braces or Orthoses: Sling;Other Brace Other Brace: shoulder immobilizer Restrictions Weight Bearing Restrictions: Yes RUE Weight Bearing: Non weight bearing Other Position/Activity Restrictions: in sling immobilized    Mobility  Bed Mobility Overal bed mobility: Needs Assistance Bed Mobility: Supine to Sit     Supine to sit: Supervision;Min guard          Transfers Overall transfer level: Needs assistance Equipment used: 1 person hand held assist Transfers: Sit to/from [facility] Sit to Stand: Min assist Stand pivot transfers: Min assist          Ambulation/Gait Ambulation/Gait assistance: Min assist Gait Distance (Feet): 3 Feet Assistive device: Straight cane Gait Pattern/deviations: Step-to pattern Gait velocity: decreased   General Gait Details: did take 3 steps to recliner to promote upright in hopes of helping with orthostatic hypotension   Stairs             Wheelchair Mobility    Modified Rankin (Stroke Patients Only)       Balance Overall balance assessment: Needs assistance Sitting-balance support: Single extremity supported;Feet supported Sitting balance-Leahy Scale: Good     Standing balance support: Single extremity supported;During functional activity Standing balance-Leahy Scale: Fair Standing balance comment: fair reaching inside BOS                            Cognition Arousal/Alertness: Awake/alert Behavior During Therapy: WFL for tasks assessed/performed Overall Cognitive Status: Within Functional Limits for tasks assessed  Exercises Other Exercises Other Exercises: standing activities and increasing tolerance, marching in place.    General Comments        Pertinent Vitals/Pain Pain Assessment: Faces Faces Pain Scale: Hurts a little bit Pain Location: RUE, back Pain Descriptors / Indicators:  Constant;Sore Pain Intervention(s): Limited activity within patient's tolerance;Monitored during session;Repositioned    Home Living                      Prior Function            PT Goals (current goals can now be found in the care plan section) Progress towards PT goals: Progressing toward goals    Frequency    7X/week      PT Plan Current plan remains appropriate    Co-evaluation              AM-PAC PT "6 Clicks" Mobility   Outcome Measure  Help needed turning from your back to your side while in a flat bed without using bedrails?: A Little Help needed moving from lying on your back to sitting on the side of a flat bed without using bedrails?: A Little Help needed moving to and from a bed to a chair (including a wheelchair)?: A Little Help needed standing up from a chair using your arms (e.g., wheelchair or bedside chair)?: A Little Help needed to walk in hospital room?: A Lot Help needed climbing 3-5 steps with a railing? : A Lot 6 Click Score: 16    End of Session Equipment Utilized During Treatment: Gait belt Activity Tolerance: Patient tolerated treatment well Patient left: in chair;with call bell/phone within reach;with chair alarm set;with family/visitor present Nurse Communication: Mobility status;Other (comment) PT Visit Diagnosis: Unsteadiness on feet (R26.81);Repeated falls (R29.6);Muscle weakness (generalized) (M62.81);History of falling (Z91.81);Difficulty in walking, not elsewhere classified (R26.2)     Time: 3299-2426 PT Time Calculation (min) (ACUTE ONLY): 31 min  Charges:  $Therapeutic Activity: 23-37 mins                    [name], PTA 01/04/21, 12:27 PM 01/04/2021, 12:21 PM

## 2021-01-04 NOTE — Progress Notes (Signed)
[facility]  ROUNDING NOTE   Subjective:   [name] was admitted to [facility] on 12/25/2020 for Fall [W19.XXXA] Closed fracture of proximal end of right humerus, unspecified fracture morphology, initial encounter [S42.201A] Hypotension [I95.9]  Patient seen today on second floor Patient resting comfortably in the bed Patient offers no new specific physical complaint    Objective:  Vital signs in last 24 hours:  Temp:  [97.7 F (36.5 C)-98.6 F (37 C)] 98.4 F (36.9 C) (08/14 0523) Pulse Rate:  [49-77] 62 (08/14 0523) Resp:  [16-22] 18 (08/14 0523) BP: (95-141)/(46-69) 105/55 (08/14 0523) SpO2:  [97 %-100 %] 97 % (08/13 2005)  Weight change:  Filed Weights   12/25/20 0627  Weight: 97.5 kg    Intake/Output: I/O last 3 completed shifts: In: -  Out: 300 [Urine:300]   Intake/Output this shift:  No intake/output data recorded.  Physical Exam: General: NAD, sitting in chair  Head: Normocephalic, atraumatic. Moist oral mucosal membranes  Eyes: Anicteric  Lungs:  Clear to auscultation,normal effort  Heart: Regular rate and rhythm  Abdomen:  Soft, nontender  Extremities: No peripheral edema.  Neurologic: Nonfocal, moving all four extremities  Skin: No lesions  Access: RIJ permcath    Basic Metabolic Panel: Recent Labs  Lab 12/28/20 0900 01/01/21 0452 01/03/21 0459  NA 135 133* 133*  K 4.4 4.6 4.2  CL 97* 99 95*  CO2 29 25 25   GLUCOSE 142* 139* 110*  BUN 34* 53* 52*  CREATININE 3.29* 3.47* 3.42*  CALCIUM 8.7* 8.8* 8.8*  PHOS  --  4.4 4.4    Liver Function Tests: Recent Labs  Lab 01/03/21 0459  ALBUMIN 3.0*   No results for input(s): LIPASE, AMYLASE in the last 168 hours. No results for input(s): AMMONIA in the last 168 hours.  CBC: Recent Labs  Lab 12/28/20 0900 01/01/21 0452  WBC 7.5 6.9  HGB 9.5* 9.6*  HCT 27.9* 28.0*  MCV 96.9 94.6  PLT 133* 163    Cardiac Enzymes: No results for input(s): CKTOTAL, CKMB, CKMBINDEX,  TROPONINI in the last 168 hours.  BNP: Invalid input(s): POCBNP  CBG: Recent Labs  Lab 01/02/21 1203 01/02/21 1645 01/02/21 2056 01/03/21 0754 01/03/21 1651  GLUCAP 135* 137* 156* 103* 116*    Microbiology: Results for orders placed or performed during the hospital encounter of 12/25/20  Resp Panel by RT-PCR (Flu A&B, Covid) Nasopharyngeal Swab     Status: None   Collection Time: 12/25/20  8:46 AM   Specimen: Nasopharyngeal Swab; Nasopharyngeal(NP) swabs in vial transport medium  Result Value Ref Range Status   SARS Coronavirus 2 by RT PCR NEGATIVE NEGATIVE Final    Comment: (NOTE) SARS-CoV-2 target nucleic acids are NOT DETECTED.  The SARS-CoV-2 RNA is generally detectable in upper respiratory specimens during the acute phase of infection. The lowest concentration of SARS-CoV-2 viral copies this assay can detect is 138 copies/mL. A negative result does not preclude SARS-Cov-2 infection and should not be used as the sole basis for treatment or other patient management decisions. A negative result may occur with  improper specimen collection/handling, submission of specimen other than nasopharyngeal swab, presence of viral mutation(s) within the areas targeted by this assay, and inadequate number of viral copies(<138 copies/mL). A negative result must be combined with clinical observations, patient history, and epidemiological information. The expected result is Negative.  Fact Sheet for Patients:  EntrepreneurPulse.com.au  Fact Sheet for Healthcare Providers:  IncredibleEmployment.be  This test is no t yet approved  or cleared by the [place] and  has been authorized for detection and/or diagnosis of SARS-CoV-2 by FDA under an Emergency Use Authorization (EUA). This EUA will remain  in effect (meaning this test can be used) for the duration of the COVID-19 declaration under Section 564(b)(1) of the Act, 21 U.S.C.section  360bbb-3(b)(1), unless the authorization is terminated  or revoked sooner.       Influenza A by PCR NEGATIVE NEGATIVE Final   Influenza B by PCR NEGATIVE NEGATIVE Final    Comment: (NOTE) The Xpert Xpress SARS-CoV-2/FLU/RSV plus assay is intended as an aid in the diagnosis of influenza from Nasopharyngeal swab specimens and should not be used as a sole basis for treatment. Nasal washings and aspirates are unacceptable for Xpert Xpress SARS-CoV-2/FLU/RSV testing.  Fact Sheet for Patients: EntrepreneurPulse.com.au  Fact Sheet for Healthcare Providers: IncredibleEmployment.be  This test is not yet approved or cleared by the [place] FDA and has been authorized for detection and/or diagnosis of SARS-CoV-2 by FDA under an Emergency Use Authorization (EUA). This EUA will remain in effect (meaning this test can be used) for the duration of the COVID-19 declaration under Section 564(b)(1) of the Act, 21 U.S.C. section 360bbb-3(b)(1), unless the authorization is terminated or revoked.  Performed at [facility], [address] [place], [place] [address]     Coagulation Studies: No results for input(s): LABPROT, INR in the last 72 hours.  Urinalysis: No results for input(s): COLORURINE, LABSPEC, PHURINE, GLUCOSEU, HGBUR, BILIRUBINUR, KETONESUR, PROTEINUR, UROBILINOGEN, NITRITE, LEUKOCYTESUR in the last 72 hours.  Invalid input(s): APPERANCEUR    Imaging: No results found.   Medications:    sodium chloride      amiodarone  200 mg Oral Daily   Chlorhexidine Gluconate Cloth  6 each Topical Q0600   cholecalciferol  1,000 Units Oral Q breakfast   clopidogrel  75 mg Oral Q breakfast   ezetimibe  10 mg Oral QAC breakfast   ferrous sulfate  325 mg Oral BID WC   heparin  5,000 Units Subcutaneous Q8H   insulin aspart  0-6 Units Subcutaneous TID WC   levothyroxine  75 mcg Oral QAC breakfast   loratadine  10 mg Oral q morning    metoprolol succinate  12.5 mg Oral QHS   midodrine  10 mg Oral QID   multivitamin  1 tablet Oral Daily   omega-3 acid ethyl esters  4,000 mg Oral Q lunch   polyethylene glycol  17 g Oral Daily   rosuvastatin  40 mg Oral QHS   senna-docusate  2 tablet Oral BID   sodium chloride flush  3 mL Intravenous Q12H   sodium chloride, bisacodyl, fluticasone, HYDROcodone-acetaminophen, nitroGLYCERIN, ondansetron **OR** ondansetron (ZOFRAN) IV, sodium chloride flush, traMADol  Assessment/ Plan:  [name] is a 87 y.o. white male with end stage renal disease on hemodialysis, hypertension, atrial fibrillation, coronary artery disease status post CABG, diabetes mellitus type II, hypothyroidism, who is admitted to [facility] on 12/25/2020 for Fall [W19.XXXA] Closed fracture of proximal end of right humerus, unspecified fracture morphology, initial encounter [S42.201A] Hypotension [I95.9] Found to have a right humerus fracture.   [facility] TTS [address] RIJ permcath 98.5kg       1)Renal    End-stage renal disease Patient is on hemodialysis Patient is on Tuesday Thursday Saturday schedule Patient was  dialyzed yesterday NO need for HD today  Dialysis coordinator is arranging outpatient clinic based on the patient's rehab station There is currently a bed  offer in [place] with facilities with a chair time of Monday Wednesday Friday 7:30 AM  2) hypotension  Patient is on midodrine  Blood pressure is stable    3)Anemia of chronic disease  CBC Latest Ref Rng & Units 01/01/2021 12/28/2020 12/25/2020  WBC 4.0 - 10.5 K/uL 6.9 7.5 7.8  Hemoglobin 13.0 - 17.0 g/dL 9.6(L) 9.5(L) 11.5(L)  Hematocrit 39.0 - 52.0 % 28.0(L) 27.9(L) 34.0(L)  Platelets 150 - 400 K/uL 163 133(L) 136(L)       HGb at goal (9--11)   4) Secondary hyperparathyroidism -CKD Mineral-Bone Disorder    Lab Results  Component Value Date   CALCIUM 8.8 (L) 01/03/2021   PHOS 4.4 01/03/2021    Patient has a history of  secondary hyperparathyroidism Patient last intact PTH was 134, below the target of 150 Patient is currently not on any vitamin D analogs  Phosphorus at goal.   5) diabetes mellitus type 2 Patient primary team is following   6) Electrolytes   BMP Latest Ref Rng & Units 01/03/2021 01/01/2021 12/28/2020  Glucose 70 - 99 mg/dL 110(H) 139(H) 142(H)  BUN 8 - 23 mg/dL 52(H) 53(H) 34(H)  Creatinine 0.61 - 1.24 mg/dL 3.42(H) 3.47(H) 3.29(H)  Sodium 135 - 145 mmol/L 133(L) 133(L) 135  Potassium 3.5 - 5.1 mmol/L 4.2 4.6 4.4  Chloride 98 - 111 mmol/L 95(L) 99 97(L)  CO2 22 - 32 mmol/L 25 25 29   Calcium 8.9 - 10.3 mg/dL 8.8(L) 8.8(L) 8.7(L)     Sodium Hyponatremia Secondary to ESRD Inability to get rid of free water   Potassium Normokalemic    7)Acid base    Co2 at goal    Plan   No need for renal replacement therapy today We will continue to follow   LOS: 9 Danile Trier s [facility] 8/14/20227:08 AM

## 2021-01-05 LAB — RESP PANEL BY RT-PCR (FLU A&B, COVID) ARPGX2
Influenza A by PCR: NEGATIVE
Influenza B by PCR: NEGATIVE
SARS Coronavirus 2 by RT PCR: NEGATIVE

## 2021-01-05 LAB — GLUCOSE, CAPILLARY
Glucose-Capillary: 112 mg/dL — ABNORMAL HIGH (ref 70–99)
Glucose-Capillary: 90 mg/dL (ref 70–99)
Glucose-Capillary: 154 mg/dL — ABNORMAL HIGH (ref 70–99)

## 2021-01-05 MED ORDER — SENNOSIDES-DOCUSATE SODIUM 8.6-50 MG PO TABS
2.0000 | ORAL_TABLET | Freq: Two times a day (BID) | ORAL | 0 refills | Status: DC
Start: 2021-01-05 — End: 2024-03-20

## 2021-01-05 MED ORDER — INSULIN ASPART 100 UNIT/ML IJ SOLN: 0.0000 [IU] | Freq: Three times a day (TID) | INTRAMUSCULAR | 11 refills | Status: DC

## 2021-01-05 MED ORDER — METOPROLOL SUCCINATE ER 25 MG PO TB24: 12.5000 mg | ORAL_TABLET | Freq: Every day | ORAL | 0 refills | Status: DC

## 2021-01-05 MED ORDER — HEPARIN SODIUM (PORCINE) 1000 UNIT/ML IJ SOLN
INTRAMUSCULAR | Status: AC
  Filled 2021-01-05: qty 1

## 2021-01-05 MED ORDER — MIDODRINE HCL 10 MG PO TABS: 10.0000 mg | ORAL_TABLET | Freq: Four times a day (QID) | ORAL | 0 refills | Status: DC

## 2021-01-05 MED ORDER — BISACODYL 10 MG RE SUPP: 10.0000 mg | Freq: Every day | RECTAL | 0 refills | Status: DC | PRN

## 2021-01-05 MED ORDER — HYDROCODONE-ACETAMINOPHEN 5-325 MG PO TABS: 1.0000 | ORAL_TABLET | Freq: Four times a day (QID) | ORAL | 0 refills | Status: DC | PRN

## 2021-01-05 MED ORDER — POLYETHYLENE GLYCOL 3350 17 G PO PACK: 17.0000 g | PACK | Freq: Every day | ORAL | 0 refills | Status: DC

## 2021-01-05 MED ORDER — TRAMADOL HCL 50 MG PO TABS: 50.0000 mg | ORAL_TABLET | Freq: Four times a day (QID) | ORAL | 0 refills | Status: DC | PRN

## 2021-01-05 NOTE — Progress Notes (Signed)
[place] [facility] [place] NAME: Matthew [name]    MR#:  [id number]  DATE OF BIRTH:  05/24/1931  SUBJECTIVE:   patient denies any complaints. Patient is sitting at the edge of the bed. Denies any dizziness. Overall feeling better. No family REVIEW OF SYSTEMS:   Review of Systems  Constitutional:  Negative for chills, fever and weight loss.  HENT:  Negative for ear discharge, ear pain and nosebleeds.   Eyes:  Negative for blurred vision, pain and discharge.  Respiratory:  Negative for sputum production, shortness of breath, wheezing and stridor.   Cardiovascular:  Negative for chest pain, palpitations, orthopnea and PND.  Gastrointestinal:  Negative for abdominal pain, diarrhea, nausea and vomiting.  Genitourinary:  Negative for frequency and urgency.  Musculoskeletal:  Positive for joint pain. Negative for back pain.  Neurological:  Positive for weakness. Negative for sensory change, speech change and focal weakness.  Psychiatric/Behavioral:  Negative for depression and hallucinations. The patient is not nervous/anxious.   Tolerating Diet:yes Tolerating PT: rehab  DRUG ALLERGIES:   Allergies  Allergen Reactions   Other     Other reaction(s): Myalgias (intolerance), Other (See Comments) Statin Drugs  Statin Drugs     Amoxicillin Diarrhea    VITALS:  Blood pressure 126/61, pulse 89, temperature 98.7 F (37.1 C), resp. rate 18, height 6' (1.829 m), weight 97.5 kg, SpO2 91 %.  PHYSICAL EXAMINATION:   Physical Exam  GENERAL:  87 y.o.-year-old patient lying in the bed with no acute distress.  LUNGS: Normal breath sounds bilaterally, no wheezing, rales, rhonchi. No use of accessory muscles of respiration.  CARDIOVASCULAR: S1, S2 normal. No murmurs, rubs, or gallops.  ABDOMEN: Soft, nontender, nondistended. Bowel sounds present. No organomegaly or mass.  EXTREMITIES: No cyanosis, clubbing or edema b/l.  Right arm sling+  NEUROLOGIC:  Cranial nerves II through XII are intact. No focal Motor or sensory deficits b/l.   PSYCHIATRIC:  patient is alert and oriented x 3.  SKIN: No obvious rash, lesion, or ulcer.   LABORATORY PANEL:  CBC Recent Labs  Lab 01/01/21 0452  WBC 6.9  HGB 9.6*  HCT 28.0*  PLT 163     Chemistries  Recent Labs  Lab 01/03/21 0459  NA 133*  K 4.2  CL 95*  CO2 25  GLUCOSE 110*  BUN 52*  CREATININE 3.42*  CALCIUM 8.8*    Cardiac Enzymes No results for input(s): TROPONINI in the last 168 hours. RADIOLOGY:  No results found. ASSESSMENT AND PLAN:  87 year old gentleman with prior history of type 2 diabetes mellitus, coronary artery disease, end-stage renal disease on dialysis, hypo-thyroidism, chronic systolic heart failure presents from home with a fall was found to have a right humerus fracture.  Orthopedics consulted on admission recommended conservative management with sling and outpatient follow-up in 1 week to 10 days  Proximal humerus fracture on the right --Case discussed with orthopedic surgery  ( I believe that would be Dr Harlow Mares on call on aug 4th--no ortho MD name documented by previous MD's)recommended outpatient follow-up in 7 to 10 days.  -- recommend a right arm immobilizer/sling --Pain control and therapy evaluations recommending SNF. --8/11-- d/w dr Harlow Mares who has spoken with pt's dter regarding f/u plan after discharge  Orthostatic Hypotension BP stays well with  midodrine 10 mg qid   Coronary artery disease s/p CABG --Continue with Plavix, Crestor and Zetia.   Hypothyroidism --Continue with Synthroid.   Chronic systolic heart failure --Further management  as per dialysis.   --Patient was on furosemide at home currently on hold due to hypotension.  If his blood pressure improves he can be  started on Lasix on nondialysis days--will defer to Nephrology to resume as out pt   End-stage renal disease on dialysis with TTS Nephrology on board. . Type 2 diabetes  mellitus with end-stage renal disease--Continue with sliding scale insulin at this time. --Last A1c is 6.3 % in 10/2020    Anemia of chronic disease --Hemoglobin stable around 9 --Transfuse to keep hemoglobin greater than 7.      Mild thrombocytopenia Continue to monitor--appears stable     DVT prophylaxis: Heparin Code Status: DNR Family Communication: none today Disposition: to rehab once bed available   Status is: Inpatient     Dispo: The patient is from: Home              Anticipated d/c is to: SNF once HD chair time is confirmed              Patient currently is medically stable to d/c.                 patient has been medically stable. Awaiting final dialysis chair time         TOTAL TIME TAKING CARE OF THIS PATIENT: 25 minutes.  >50% time spent on counselling and coordination of care  Note: This dictation was prepared with Dragon dictation along with smaller phrase technology. Any transcriptional errors that result from this process are unintentional.  Fritzi Mandes M.D    [facility]   CC: Primary care physician; Jodi Marble, MD Patient ID: Matthew [name], male   DOB: 04-Jun-1930, 87 y.o.   MRN: [id number]

## 2021-01-05 NOTE — TOC Progression Note (Signed)
Transition of Care [facility]) - Progression Note    Patient Details  Name: Matthew [name] MRN: [id number] Date of Birth: 04-09-1931  Transition of [facility] CM/SW Contact  Beverly Sessions, RN Phone Number: 01/05/2021, 10:35 AM  Clinical Narrative:      Per HD coordinator conformation of HD center/time still pending  Expected Discharge Plan: [place] Barriers to Discharge: Continued Medical Work up  Expected Discharge Plan and Services Expected Discharge Plan: [place] Choice: [place] arrangements for the past 2 months: Single Family Home                                       Social Determinants of Health (SDOH) Interventions    Readmission Risk Interventions No flowsheet data found.

## 2021-01-05 NOTE — Progress Notes (Signed)
Received confirmation that patient can start on Wednesday 8/17 at 10:30am at [facility]. Patient chair time will be MWF 11:15.

## 2021-01-05 NOTE — Progress Notes (Signed)
[facility]  ROUNDING NOTE   Subjective:   [name] was admitted to [facility] on 12/25/2020 for Fall [W19.XXXA] Closed fracture of proximal end of right humerus, unspecified fracture morphology, initial encounter [S42.201A] Hypotension [I95.9]  Patient seen during dialysis   HEMODIALYSIS FLOWSHEET:  Blood Flow Rate (mL/min): 400 mL/min Arterial Pressure (mmHg): -150 mmHg Venous Pressure (mmHg): 130 mmHg Transmembrane Pressure (mmHg): 60 mmHg Ultrafiltration Rate (mL/min): 290 mL/min Dialysate Flow Rate (mL/min): 500 ml/min Conductivity: Machine : 13.7 Conductivity: Machine : 13.7 Dialysis Fluid Bolus: Normal Saline Bolus Amount (mL): 300 mL  No complaints at this time Tolerating meals  Objective:  Vital signs in last 24 hours:  Temp:  [97.9 F (36.6 C)-98.7 F (37.1 C)] 98.2 F (36.8 C) (08/15 1100) Pulse Rate:  [51-105] 60 (08/15 1245) Resp:  [11-23] 22 (08/15 1245) BP: (126-151)/(52-85) 139/79 (08/15 1245) SpO2:  [91 %-98 %] 91 % (08/15 0813)  Weight change:  Filed Weights   12/25/20 0627  Weight: 97.5 kg    Intake/Output: I/O last 3 completed shifts: In: 26 [P.O.:580] Out: -    Intake/Output this shift:  Total I/O In: 240 [P.O.:240] Out: -   Physical Exam: General: NAD, laying in bed  Head: Normocephalic, atraumatic. Moist oral mucosal membranes  Eyes: Anicteric  Lungs:  Clear to auscultation,normal effort  Heart: Regular rate and rhythm  Abdomen:  Soft, nontender  Extremities: No peripheral edema.  Neurologic: Nonfocal, moving all four extremities  Skin: No lesions  Access: RIJ permcath    Basic Metabolic Panel: Recent Labs  Lab 01/01/21 0452 01/03/21 0459  NA 133* 133*  K 4.6 4.2  CL 99 95*  CO2 25 25  GLUCOSE 139* 110*  BUN 53* 52*  CREATININE 3.47* 3.42*  CALCIUM 8.8* 8.8*  PHOS 4.4 4.4     Liver Function Tests: Recent Labs  Lab 01/03/21 0459  ALBUMIN 3.0*    No results for input(s): LIPASE, AMYLASE  in the last 168 hours. No results for input(s): AMMONIA in the last 168 hours.  CBC: Recent Labs  Lab 01/01/21 0452  WBC 6.9  HGB 9.6*  HCT 28.0*  MCV 94.6  PLT 163     Cardiac Enzymes: No results for input(s): CKTOTAL, CKMB, CKMBINDEX, TROPONINI in the last 168 hours.  BNP: Invalid input(s): POCBNP  CBG: Recent Labs  Lab 01/04/21 0733 01/04/21 1128 01/04/21 1625 01/04/21 2302 01/05/21 0756  GLUCAP 92 139* 140* 182* 112*     Microbiology: Results for orders placed or performed during the hospital encounter of 12/25/20  Resp Panel by RT-PCR (Flu A&B, Covid) Nasopharyngeal Swab     Status: None   Collection Time: 12/25/20  8:46 AM   Specimen: Nasopharyngeal Swab; Nasopharyngeal(NP) swabs in vial transport medium  Result Value Ref Range Status   SARS Coronavirus 2 by RT PCR NEGATIVE NEGATIVE Final    Comment: (NOTE) SARS-CoV-2 target nucleic acids are NOT DETECTED.  The SARS-CoV-2 RNA is generally detectable in upper respiratory specimens during the acute phase of infection. The lowest concentration of SARS-CoV-2 viral copies this assay can detect is 138 copies/mL. A negative result does not preclude SARS-Cov-2 infection and should not be used as the sole basis for treatment or other patient management decisions. A negative result may occur with  improper specimen collection/handling, submission of specimen other than nasopharyngeal swab, presence of viral mutation(s) within the areas targeted by this assay, and inadequate number of viral copies(<138 copies/mL). A negative result must be combined with clinical  observations, patient history, and epidemiological information. The expected result is Negative.  Fact Sheet for Patients:  EntrepreneurPulse.com.au  Fact Sheet for Healthcare Providers:  IncredibleEmployment.be  This test is no t yet approved or cleared by the [place] FDA and  has been authorized for detection  and/or diagnosis of SARS-CoV-2 by FDA under an Emergency Use Authorization (EUA). This EUA will remain  in effect (meaning this test can be used) for the duration of the COVID-19 declaration under Section 564(b)(1) of the Act, 21 U.S.C.section 360bbb-3(b)(1), unless the authorization is terminated  or revoked sooner.       Influenza A by PCR NEGATIVE NEGATIVE Final   Influenza B by PCR NEGATIVE NEGATIVE Final    Comment: (NOTE) The Xpert Xpress SARS-CoV-2/FLU/RSV plus assay is intended as an aid in the diagnosis of influenza from Nasopharyngeal swab specimens and should not be used as a sole basis for treatment. Nasal washings and aspirates are unacceptable for Xpert Xpress SARS-CoV-2/FLU/RSV testing.  Fact Sheet for Patients: EntrepreneurPulse.com.au  Fact Sheet for Healthcare Providers: IncredibleEmployment.be  This test is not yet approved or cleared by the [place] FDA and has been authorized for detection and/or diagnosis of SARS-CoV-2 by FDA under an Emergency Use Authorization (EUA). This EUA will remain in effect (meaning this test can be used) for the duration of the COVID-19 declaration under Section 564(b)(1) of the Act, 21 U.S.C. section 360bbb-3(b)(1), unless the authorization is terminated or revoked.  Performed at [facility], [address] [place], [place] [address]     Coagulation Studies: No results for input(s): LABPROT, INR in the last 72 hours.  Urinalysis: No results for input(s): COLORURINE, LABSPEC, PHURINE, GLUCOSEU, HGBUR, BILIRUBINUR, KETONESUR, PROTEINUR, UROBILINOGEN, NITRITE, LEUKOCYTESUR in the last 72 hours.  Invalid input(s): APPERANCEUR    Imaging: No results found.   Medications:    sodium chloride      heparin sodium (porcine)       amiodarone  200 mg Oral Daily   Chlorhexidine Gluconate Cloth  6 each Topical Q0600   cholecalciferol  1,000 Units Oral Q breakfast    clopidogrel  75 mg Oral Q breakfast   ezetimibe  10 mg Oral QAC breakfast   ferrous sulfate  325 mg Oral BID WC   heparin  5,000 Units Subcutaneous Q8H   insulin aspart  0-6 Units Subcutaneous TID WC   levothyroxine  75 mcg Oral QAC breakfast   loratadine  10 mg Oral q morning   metoprolol succinate  12.5 mg Oral QHS   midodrine  10 mg Oral QID   multivitamin  1 tablet Oral Daily   omega-3 acid ethyl esters  4,000 mg Oral Q lunch   polyethylene glycol  17 g Oral Daily   rosuvastatin  40 mg Oral QHS   senna-docusate  2 tablet Oral BID   sodium chloride flush  3 mL Intravenous Q12H   sodium chloride, bisacodyl, fluticasone, HYDROcodone-acetaminophen, nitroGLYCERIN, ondansetron **OR** ondansetron (ZOFRAN) IV, sodium chloride flush, traMADol  Assessment/ Plan:  Mr. DAMANTE SPRAGG is a 87 y.o. white male with end stage renal disease on hemodialysis, hypertension, atrial fibrillation, coronary artery disease status post CABG, diabetes mellitus type II, hypothyroidism, who is admitted to [facility] on 12/25/2020 for Fall [W19.XXXA] Closed fracture of proximal end of right humerus, unspecified fracture morphology, initial encounter [S42.201A] Hypotension [I95.9] Found to have a right humerus fracture.   [facility] TTS [address] RIJ permcath 98.5kg  End Stage Renal Disease: TTS, Receiving dialysis today,  low UF. Tolerating well. Next treatment scheduled for Wednesday  Dialysis coordinator arranged outpatient clinic at [facility] in [place] with chair time of MWF at 730.   Hypotension:  Home regimen of midodrine before dialysis and for hypertension he takes metoprolol and furosemide. Furosemide used for volume control and will be transitioned to non-dialysis days. Continue to hold Furosemide.  Midodrine scheduled four times a day with ability to give another dose after 93min.  Anemia of chronic kidney disease: hemoglobin at goal 9.6.   Secondary Hyperparathyroidism: outpatient PTH on 7/12 was  low at 134. Not currently on binders.  Phosphorus at goal. Hold vitamin D agents.     LOS: [place] 8/15/20222:26 PM

## 2021-01-05 NOTE — Progress Notes (Signed)
Pt. with rt. chest CVC tolerated tx well, BFR 400; met targeted UF at 0.5 liters. VSS. No s/sx of infection. Pt. returned to unit.

## 2021-01-05 NOTE — Progress Notes (Signed)
Physical Therapy Treatment Patient Details Name: Matthew [name] MRN: [id number] DOB: 03-Nov-1930 Today's Date: 01/05/2021    History of Present Illness Matthew [name] is a 87 y.o. male with medical history significant for diabetes mellitus with complications of end-stage renal disease on hemodialysis (dialysis days are Tuesday/Thursday/Saturday), coronary artery disease status post CABG, hypertension who presents to the ER for evaluation after he fell this morning while getting ready to go to dialysis.  Patient states that he leaned over to pick up a sock when he lost his balance and fell landing on his right shoulder.  He denied feeling dizzy or lightheaded and did not hit his head.  There was no loss of consciousness.    PT Comments    Ready for session.  BP's not taken this session.  Midodrine taken about 90 minutes prior to session.  Pt awaiting transport to dialysis so opportunity for gait taken.  To EOB with supervision.  He is able to stand with [facility] and walk 50' then 77' after seated rest break.  Recliner follow for safety.  Gait remains unsteady and a high fall risk.  He has several small LOB's where he continues to walk instead of stopping to regain balance and needs cues to do so.  He will walk close to rail in hallway and at times will opt to use rail vs cane for stability.  He does endorse fatigue and needs cues to sit and rest.  He denies dizziness but he typically will describe decrease in BP as "weakness" vs dizziness/light headedness. Returned to bed after session to await transport.   Follow Up Recommendations  SNF     Equipment Recommendations  None recommended by PT    Recommendations for Other Services       Precautions / Restrictions Precautions Precautions: Fall Type of Shoulder Precautions: R humerus fx- per MD note keep RUE immobilized Precaution Comments: orthostatic hypotensive Required Braces or Orthoses: Sling;Other Brace Other Brace: shoulder  immobilizer Restrictions Weight Bearing Restrictions: Yes RUE Weight Bearing: Non weight bearing Other Position/Activity Restrictions: in sling immobilized    Mobility  Bed Mobility Overal bed mobility: Needs Assistance Bed Mobility: Supine to Sit     Supine to sit: Supervision;Min guard          Transfers Overall transfer level: Needs assistance Equipment used: 1 person hand held assist;Straight cane Transfers: Sit to/from [facility] Sit to Stand: Min assist Stand pivot transfers: Min assist          Ambulation/Gait Ambulation/Gait assistance: Min assist;+2 safety/equipment Gait Distance (Feet): 50 Feet Assistive device: Straight cane Gait Pattern/deviations: Step-to pattern;Staggering right;Staggering left;Narrow base of support;Decreased step length - right;Decreased step length - left Gait velocity: decreased   General Gait Details: 3' then 40' after seated rest.  generally unsteady with wheelchair follow for safety   Stairs             Wheelchair Mobility    Modified Rankin (Stroke Patients Only)       Balance Overall balance assessment: Needs assistance Sitting-balance support: Single extremity supported;Feet supported Sitting balance-Leahy Scale: Good     Standing balance support: Single extremity supported;During functional activity Standing balance-Leahy Scale: Fair Standing balance comment: fair reaching inside BOS                            Cognition Arousal/Alertness: Awake/alert Behavior During Therapy: WFL for tasks assessed/performed Overall Cognitive Status: Within Functional Limits for tasks assessed  Exercises      General Comments        Pertinent Vitals/Pain Pain Assessment: No/denies pain    Home Living                      Prior Function            PT Goals (current goals can now be found in the care plan section)  Progress towards PT goals: Progressing toward goals    Frequency    7X/week      PT Plan Current plan remains appropriate    Co-evaluation              AM-PAC PT "6 Clicks" Mobility   Outcome Measure  Help needed turning from your back to your side while in a flat bed without using bedrails?: A Little Help needed moving from lying on your back to sitting on the side of a flat bed without using bedrails?: A Little Help needed moving to and from a bed to a chair (including a wheelchair)?: A Little Help needed standing up from a chair using your arms (e.g., wheelchair or bedside chair)?: A Little Help needed to walk in hospital room?: A Lot Help needed climbing 3-5 steps with a railing? : A Lot 6 Click Score: 16    End of Session Equipment Utilized During Treatment: Gait belt Activity Tolerance: Patient tolerated treatment well Patient left: in chair;with call bell/phone within reach;with chair alarm set;with family/visitor present Nurse Communication: Mobility status;Other (comment) PT Visit Diagnosis: Unsteadiness on feet (R26.81);Repeated falls (R29.6);Muscle weakness (generalized) (M62.81);History of falling (Z91.81);Difficulty in walking, not elsewhere classified (R26.2)     Time: 0786-7544 PT Time Calculation (min) (ACUTE ONLY): 12 min  Charges:  $Gait Training: 8-22 mins                    [name], PTA 01/05/21, 11:16 AM , 11:13 AM

## 2021-01-06 DIAGNOSIS — I200 Unstable angina: Secondary | ICD-10-CM | POA: Diagnosis not present

## 2021-01-06 DIAGNOSIS — C4442 Squamous cell carcinoma of skin of scalp and neck: Secondary | ICD-10-CM | POA: Diagnosis not present

## 2021-01-06 DIAGNOSIS — E669 Obesity, unspecified: Secondary | ICD-10-CM | POA: Diagnosis not present

## 2021-01-06 DIAGNOSIS — E039 Hypothyroidism, unspecified: Secondary | ICD-10-CM | POA: Diagnosis not present

## 2021-01-06 DIAGNOSIS — I509 Heart failure, unspecified: Secondary | ICD-10-CM | POA: Diagnosis not present

## 2021-01-06 DIAGNOSIS — N186 End stage renal disease: Secondary | ICD-10-CM | POA: Diagnosis not present

## 2021-01-06 DIAGNOSIS — R279 Unspecified lack of coordination: Secondary | ICD-10-CM | POA: Diagnosis not present

## 2021-01-06 DIAGNOSIS — S42201A Unspecified fracture of upper end of right humerus, initial encounter for closed fracture: Secondary | ICD-10-CM | POA: Diagnosis not present

## 2021-01-06 DIAGNOSIS — D649 Anemia, unspecified: Secondary | ICD-10-CM | POA: Diagnosis not present

## 2021-01-06 DIAGNOSIS — R251 Tremor, unspecified: Secondary | ICD-10-CM | POA: Diagnosis not present

## 2021-01-06 DIAGNOSIS — I10 Essential (primary) hypertension: Secondary | ICD-10-CM | POA: Diagnosis not present

## 2021-01-06 DIAGNOSIS — I480 Paroxysmal atrial fibrillation: Secondary | ICD-10-CM | POA: Diagnosis not present

## 2021-01-06 DIAGNOSIS — K219 Gastro-esophageal reflux disease without esophagitis: Secondary | ICD-10-CM | POA: Diagnosis not present

## 2021-01-06 DIAGNOSIS — E119 Type 2 diabetes mellitus without complications: Secondary | ICD-10-CM | POA: Diagnosis not present

## 2021-01-06 DIAGNOSIS — I5022 Chronic systolic (congestive) heart failure: Secondary | ICD-10-CM | POA: Diagnosis not present

## 2021-01-06 DIAGNOSIS — I959 Hypotension, unspecified: Secondary | ICD-10-CM | POA: Diagnosis not present

## 2021-01-06 DIAGNOSIS — N2581 Secondary hyperparathyroidism of renal origin: Secondary | ICD-10-CM | POA: Diagnosis not present

## 2021-01-06 DIAGNOSIS — E1022 Type 1 diabetes mellitus with diabetic chronic kidney disease: Secondary | ICD-10-CM | POA: Diagnosis not present

## 2021-01-06 DIAGNOSIS — R2689 Other abnormalities of gait and mobility: Secondary | ICD-10-CM | POA: Diagnosis not present

## 2021-01-06 DIAGNOSIS — R4182 Altered mental status, unspecified: Secondary | ICD-10-CM | POA: Diagnosis not present

## 2021-01-06 DIAGNOSIS — S42291D Other displaced fracture of upper end of right humerus, subsequent encounter for fracture with routine healing: Secondary | ICD-10-CM | POA: Diagnosis not present

## 2021-01-06 DIAGNOSIS — S42301A Unspecified fracture of shaft of humerus, right arm, initial encounter for closed fracture: Secondary | ICD-10-CM | POA: Diagnosis not present

## 2021-01-06 DIAGNOSIS — E1122 Type 2 diabetes mellitus with diabetic chronic kidney disease: Secondary | ICD-10-CM | POA: Diagnosis not present

## 2021-01-06 DIAGNOSIS — S72471D Torus fracture of lower end of right femur, subsequent encounter for fracture with routine healing: Secondary | ICD-10-CM | POA: Diagnosis not present

## 2021-01-06 DIAGNOSIS — R5381 Other malaise: Secondary | ICD-10-CM | POA: Diagnosis not present

## 2021-01-06 DIAGNOSIS — I2510 Atherosclerotic heart disease of native coronary artery without angina pectoris: Secondary | ICD-10-CM | POA: Diagnosis not present

## 2021-01-06 DIAGNOSIS — R2681 Unsteadiness on feet: Secondary | ICD-10-CM | POA: Diagnosis not present

## 2021-01-06 DIAGNOSIS — S42471D Displaced transcondylar fracture of right humerus, subsequent encounter for fracture with routine healing: Secondary | ICD-10-CM | POA: Diagnosis not present

## 2021-01-06 DIAGNOSIS — M25511 Pain in right shoulder: Secondary | ICD-10-CM | POA: Diagnosis not present

## 2021-01-06 DIAGNOSIS — D509 Iron deficiency anemia, unspecified: Secondary | ICD-10-CM | POA: Diagnosis not present

## 2021-01-06 DIAGNOSIS — D631 Anemia in chronic kidney disease: Secondary | ICD-10-CM | POA: Diagnosis not present

## 2021-01-06 DIAGNOSIS — R52 Pain, unspecified: Secondary | ICD-10-CM | POA: Diagnosis not present

## 2021-01-06 DIAGNOSIS — Z992 Dependence on renal dialysis: Secondary | ICD-10-CM | POA: Diagnosis not present

## 2021-01-06 DIAGNOSIS — S42294K Other nondisplaced fracture of upper end of right humerus, subsequent encounter for fracture with nonunion: Secondary | ICD-10-CM | POA: Diagnosis not present

## 2021-01-06 DIAGNOSIS — I519 Heart disease, unspecified: Secondary | ICD-10-CM | POA: Diagnosis not present

## 2021-01-06 DIAGNOSIS — M6281 Muscle weakness (generalized): Secondary | ICD-10-CM | POA: Diagnosis not present

## 2021-01-06 DIAGNOSIS — E785 Hyperlipidemia, unspecified: Secondary | ICD-10-CM | POA: Diagnosis not present

## 2021-01-06 LAB — GLUCOSE, CAPILLARY: Glucose-Capillary: 114 mg/dL — ABNORMAL HIGH (ref 70–99)

## 2021-01-06 LAB — ACTH: C206 ACTH: 7.5 pg/mL (ref 7.2–63.3)

## 2021-01-06 NOTE — Progress Notes (Signed)
[facility]  ROUNDING NOTE   Subjective:   [name] was admitted to [facility] on 12/25/2020 for Fall [W19.XXXA] Closed fracture of proximal end of right humerus, unspecified fracture morphology, initial encounter [S42.201A] Hypotension [I95.9]  Patient sitting up in bed Alert and oriented Denies nausea and vomiting Denies shortness of breath    Objective:  Vital signs in last 24 hours:  Temp:  [97.8 F (36.6 C)-98.5 F (36.9 C)] 98.5 F (36.9 C) (08/16 0802) Pulse Rate:  [51-116] 93 (08/16 0802) Resp:  [11-23] 18 (08/16 0802) BP: (114-155)/(52-86) 124/68 (08/16 0802) SpO2:  [92 %-100 %] 99 % (08/16 0802)  Weight change:  Filed Weights   12/25/20 0627  Weight: 97.5 kg    Intake/Output: I/O last 3 completed shifts: In: 240 [P.O.:240] Out: 700 [Urine:200; Other:500]   Intake/Output this shift:  No intake/output data recorded.  Physical Exam: General: NAD, sitting up in bed  Head: Normocephalic, atraumatic. Moist oral mucosal membranes  Eyes: Anicteric  Lungs:  Clear to auscultation,normal effort  Heart: Regular rate and rhythm  Abdomen:  Soft, nontender  Extremities: No peripheral edema.  Neurologic: Nonfocal, moving all four extremities  Skin: No lesions  Access: RIJ permcath    Basic Metabolic Panel: Recent Labs  Lab 01/01/21 0452 01/03/21 0459  NA 133* 133*  K 4.6 4.2  CL 99 95*  CO2 25 25  GLUCOSE 139* 110*  BUN 53* 52*  CREATININE 3.47* 3.42*  CALCIUM 8.8* 8.8*  PHOS 4.4 4.4     Liver Function Tests: Recent Labs  Lab 01/03/21 0459  ALBUMIN 3.0*    No results for input(s): LIPASE, AMYLASE in the last 168 hours. No results for input(s): AMMONIA in the last 168 hours.  CBC: Recent Labs  Lab 01/01/21 0452  WBC 6.9  HGB 9.6*  HCT 28.0*  MCV 94.6  PLT 163     Cardiac Enzymes: No results for input(s): CKTOTAL, CKMB, CKMBINDEX, TROPONINI in the last 168 hours.  BNP: Invalid input(s): POCBNP  CBG: Recent  Labs  Lab 01/04/21 2302 01/05/21 0756 01/05/21 1638 01/05/21 2117 01/06/21 0747  GLUCAP 182* 112* 90 154* 114*     Microbiology: Results for orders placed or performed during the hospital encounter of 12/25/20  Resp Panel by RT-PCR (Flu A&B, Covid) Nasopharyngeal Swab     Status: None   Collection Time: 12/25/20  8:46 AM   Specimen: Nasopharyngeal Swab; Nasopharyngeal(NP) swabs in vial transport medium  Result Value Ref Range Status   SARS Coronavirus 2 by RT PCR NEGATIVE NEGATIVE Final    Comment: (NOTE) SARS-CoV-2 target nucleic acids are NOT DETECTED.  The SARS-CoV-2 RNA is generally detectable in upper respiratory specimens during the acute phase of infection. The lowest concentration of SARS-CoV-2 viral copies this assay can detect is 138 copies/mL. A negative result does not preclude SARS-Cov-2 infection and should not be used as the sole basis for treatment or other patient management decisions. A negative result may occur with  improper specimen collection/handling, submission of specimen other than nasopharyngeal swab, presence of viral mutation(s) within the areas targeted by this assay, and inadequate number of viral copies(<138 copies/mL). A negative result must be combined with clinical observations, patient history, and epidemiological information. The expected result is Negative.  Fact Sheet for Patients:  EntrepreneurPulse.com.au  Fact Sheet for Healthcare Providers:  IncredibleEmployment.be  This test is no t yet approved or cleared by the [place] FDA and  has been authorized for detection and/or diagnosis of  SARS-CoV-2 by FDA under an Emergency Use Authorization (EUA). This EUA will remain  in effect (meaning this test can be used) for the duration of the COVID-19 declaration under Section 564(b)(1) of the Act, 21 U.S.C.section 360bbb-3(b)(1), unless the authorization is terminated  or revoked sooner.        Influenza A by PCR NEGATIVE NEGATIVE Final   Influenza B by PCR NEGATIVE NEGATIVE Final    Comment: (NOTE) The Xpert Xpress SARS-CoV-2/FLU/RSV plus assay is intended as an aid in the diagnosis of influenza from Nasopharyngeal swab specimens and should not be used as a sole basis for treatment. Nasal washings and aspirates are unacceptable for Xpert Xpress SARS-CoV-2/FLU/RSV testing.  Fact Sheet for Patients: EntrepreneurPulse.com.au  Fact Sheet for Healthcare Providers: IncredibleEmployment.be  This test is not yet approved or cleared by the [place] FDA and has been authorized for detection and/or diagnosis of SARS-CoV-2 by FDA under an Emergency Use Authorization (EUA). This EUA will remain in effect (meaning this test can be used) for the duration of the COVID-19 declaration under Section 564(b)(1) of the Act, 21 U.S.C. section 360bbb-3(b)(1), unless the authorization is terminated or revoked.  Performed at [facility], [address] [place], [place] [address]   Resp Panel by RT-PCR (Flu A&B, Covid) Nasopharyngeal Swab     Status: None   Collection Time: 01/05/21  2:54 PM   Specimen: Nasopharyngeal Swab; Nasopharyngeal(NP) swabs in vial transport medium  Result Value Ref Range Status   SARS Coronavirus 2 by RT PCR NEGATIVE NEGATIVE Final    Comment: (NOTE) SARS-CoV-2 target nucleic acids are NOT DETECTED.  The SARS-CoV-2 RNA is generally detectable in upper respiratory specimens during the acute phase of infection. The lowest concentration of SARS-CoV-2 viral copies this assay can detect is 138 copies/mL. A negative result does not preclude SARS-Cov-2 infection and should not be used as the sole basis for treatment or other patient management decisions. A negative result may occur with  improper specimen collection/handling, submission of specimen other than nasopharyngeal swab, presence of viral mutation(s) within  the areas targeted by this assay, and inadequate number of viral copies(<138 copies/mL). A negative result must be combined with clinical observations, patient history, and epidemiological information. The expected result is Negative.  Fact Sheet for Patients:  EntrepreneurPulse.com.au  Fact Sheet for Healthcare Providers:  IncredibleEmployment.be  This test is no t yet approved or cleared by the [place] FDA and  has been authorized for detection and/or diagnosis of SARS-CoV-2 by FDA under an Emergency Use Authorization (EUA). This EUA will remain  in effect (meaning this test can be used) for the duration of the COVID-19 declaration under Section 564(b)(1) of the Act, 21 U.S.C.section 360bbb-3(b)(1), unless the authorization is terminated  or revoked sooner.       Influenza A by PCR NEGATIVE NEGATIVE Final   Influenza B by PCR NEGATIVE NEGATIVE Final    Comment: (NOTE) The Xpert Xpress SARS-CoV-2/FLU/RSV plus assay is intended as an aid in the diagnosis of influenza from Nasopharyngeal swab specimens and should not be used as a sole basis for treatment. Nasal washings and aspirates are unacceptable for Xpert Xpress SARS-CoV-2/FLU/RSV testing.  Fact Sheet for Patients: EntrepreneurPulse.com.au  Fact Sheet for Healthcare Providers: IncredibleEmployment.be  This test is not yet approved or cleared by the [place] FDA and has been authorized for detection and/or diagnosis of SARS-CoV-2 by FDA under an Emergency Use Authorization (EUA). This EUA will remain in effect (meaning this test can be used) for  the duration of the COVID-19 declaration under Section 564(b)(1) of the Act, 21 U.S.C. section 360bbb-3(b)(1), unless the authorization is terminated or revoked.  Performed at [facility], [address] [place], [place] [address]     Coagulation Studies: No results for input(s):  LABPROT, INR in the last 72 hours.  Urinalysis: No results for input(s): COLORURINE, LABSPEC, PHURINE, GLUCOSEU, HGBUR, BILIRUBINUR, KETONESUR, PROTEINUR, UROBILINOGEN, NITRITE, LEUKOCYTESUR in the last 72 hours.  Invalid input(s): APPERANCEUR    Imaging: No results found.   Medications:    sodium chloride      amiodarone  200 mg Oral Daily   Chlorhexidine Gluconate Cloth  6 each Topical Q0600   cholecalciferol  1,000 Units Oral Q breakfast   clopidogrel  75 mg Oral Q breakfast   ezetimibe  10 mg Oral QAC breakfast   ferrous sulfate  325 mg Oral BID WC   heparin  5,000 Units Subcutaneous Q8H   insulin aspart  0-6 Units Subcutaneous TID WC   levothyroxine  75 mcg Oral QAC breakfast   loratadine  10 mg Oral q morning   metoprolol succinate  12.5 mg Oral QHS   midodrine  10 mg Oral QID   multivitamin  1 tablet Oral Daily   omega-3 acid ethyl esters  4,000 mg Oral Q lunch   polyethylene glycol  17 g Oral Daily   rosuvastatin  40 mg Oral QHS   senna-docusate  2 tablet Oral BID   sodium chloride flush  3 mL Intravenous Q12H   sodium chloride, bisacodyl, fluticasone, HYDROcodone-acetaminophen, nitroGLYCERIN, ondansetron **OR** ondansetron (ZOFRAN) IV, sodium chloride flush, traMADol  Assessment/ Plan:  [name] is a 87 y.o. white male with end stage renal disease on hemodialysis, hypertension, atrial fibrillation, coronary artery disease status post CABG, diabetes mellitus type II, hypothyroidism, who is admitted to [facility] on 12/25/2020 for Fall [W19.XXXA] Closed fracture of proximal end of right humerus, unspecified fracture morphology, initial encounter [S42.201A] Hypotension [I95.9] Found to have a right humerus fracture.   [facility] TTS [address] RIJ permcath 98.5kg  End Stage Renal Disease: TTS, Received dialysis yesterday, low UF 533ml acheived. Tolerating well. Next treatment scheduled for Wednesday  Dialysis coordinator arranged outpatient clinic at [facility] in [place] with chair time of MWF at 730.   Hypotension:  Home regimen of midodrine before dialysis and for hypertension he takes metoprolol and furosemide. Furosemide used for volume control and will be transitioned to non-dialysis days. Continue to hold Furosemide.  Midodrine scheduled QID with ability to give another dose after 65min if BP remains low.  Anemia of chronic kidney disease: hemoglobin at goal 9.6.   Secondary Hyperparathyroidism: outpatient PTH on 7/12 was low at 134. Not currently on binders.  Phosphorus 4.4.     LOS: [place] 8/16/20228:58 AM

## 2021-01-06 NOTE — TOC Transition Note (Addendum)
Transition of Care [facility]) - CM/SW Discharge Note   Patient Details  Name: Matthew [name] MRN: [id number] Date of Birth: June 21, 1930  Transition of Care [facility]) CM/SW Contact:  Beverly Sessions, RN Phone Number: 01/06/2021, 9:56 AM   Clinical Narrative:    Patient will DC to:[facility] [place] date:8/16 Family notified:Bonnie Transport QA:[facility]   Per MD patient ready for DC to . RN, patient, patient's family, and facility notified of DC. Discharge Summary sent to facility. RN given number for report. DC packet on chart. Ambulance transport requested for patient.    Elvera Bicker dialysis liaison notified of discharge       Barriers to Discharge: Continued Medical Work up   Patient Goals and CMS Choice Patient states their goals for this hospitalization and ongoing recovery are:: Patient not available.      Discharge Placement                       Discharge Plan and Services     Post Acute Care Choice: Skilled Nursing Facility                               Social Determinants of Health (SDOH) Interventions     Readmission Risk Interventions No flowsheet data found.

## 2021-01-06 NOTE — Care Management Important Message (Signed)
Important Message  Patient Details  Name: Matthew [name] MRN: [id number] Date of Birth: Aug 18, 1930   Medicare Important Message Given:  Yes     Dannette Barbara 01/06/2021, 10:54 AM

## 2021-01-06 NOTE — Discharge Instructions (Signed)
Ie with Right arm sling as per instruction

## 2021-01-06 NOTE — Discharge Summary (Signed)
[facility] [place] NAME: Matthew [name]    MR#:  [id number]  DATE OF BIRTH:  Jun 06, 1930  DATE OF ADMISSION:  12/25/2020 ADMITTING PHYSICIAN: Loletha Grayer, MD  DATE OF DISCHARGE: 01/06/2021  PRIMARY CARE PHYSICIAN: Jodi Marble, MD    ADMISSION DIAGNOSIS:  Fall [W19.XXXA] Closed fracture of proximal end of right humerus, unspecified fracture morphology, initial encounter [S42.201A] Hypotension [I95.9]  DISCHARGE DIAGNOSIS:  Right proximal Humerus fracture  SECONDARY DIAGNOSIS:   Past Medical History:  Diagnosis Date   Anemia    Arthritis    Broken arm 05/2013   Left   CHF (congestive heart failure) (HCC)    Chronic kidney disease    Coronary artery disease    Diabetes mellitus without complication (HCC)    Dyspnea    DOE   Edema    FEET/LEGS OCCAS   Hypertension    Myocardial infarction ([place])    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  Orthopedics consulted on admission recommended conservative management with sling and outpatient follow-up in 1 week to 10 days   Proximal humerus fracture on the right --Dr Harlow Mares recommended outpatient follow-up in 7 to 10 days.  -- He recommends a right arm immobilizer/sling --Pain control and therapy evaluations recommending SNF. --8/11-- d/w dr Harlow Mares who has spoken with pt's dter regarding f/u plan after discharge   Orthostatic Hypotension BP stays well with  midodrine 10 mg qid    Coronary artery disease s/p CABG --Continue with Plavix, Crestor and Zetia.   Hypothyroidism --Continue  with Synthroid.   Chronic systolic heart failure --Further management as per dialysis.   --Patient was on furosemide at home currently on hold due to hypotension.  If his blood pressure improves he can be  started on Lasix on nondialysis days--will defer to Nephrology to resume as out pt   End-stage renal disease on dialysis with TTS Nephrology on board. . Type 2 diabetes mellitus with end-stage renal disease--Continue with sliding scale insulin at this time. --Last A1c is 6.3 % in 10/2020    Anemia of chronic disease --Hemoglobin stable around 9 --Transfuse to keep hemoglobin greater than 7.      Mild thrombocytopenia Continue to monitor--appears stable     DVT prophylaxis: Heparin Code Status: DNR Family Communication: dter in the room Disposition: to rehab    Status is: Inpatient     Dispo: patient is from: Home              Anticipated d/c is to: SNF today since HD chair time is confirmed              Patient currently is medically stable to d/c.  CONSULTS OBTAINED:    DRUG ALLERGIES:   Allergies  Allergen Reactions   Other     Other reaction(s): Myalgias (intolerance), Other (See Comments) Statin Drugs  Statin Drugs     Amoxicillin Diarrhea    DISCHARGE MEDICATIONS:   Allergies as of 01/06/2021       Reactions   Other    Other reaction(s): Myalgias (intolerance), Other (See Comments) Statin  Drugs  Statin Drugs    Amoxicillin Diarrhea        Medication List     STOP taking these medications    furosemide 40 MG tablet Commonly known as: LASIX   glipiZIDE 5 MG tablet Commonly known as: GLUCOTROL   potassium chloride SA 20 MEQ tablet Commonly known as: KLOR-CON       TAKE these medications    acyclovir ointment 5 % Commonly known as: ZOVIRAX Apply 1 application topically daily as needed (shingles).   amiodarone 200 MG tablet Commonly known as: PACERONE Take 200 mg by mouth daily.   bisacodyl 10 MG suppository Commonly known as:  DULCOLAX Place 1 suppository (10 mg total) rectally daily as needed for moderate constipation.   cholecalciferol 25 MCG (1000 UNIT) tablet Commonly known as: VITAMIN D Take 1,000 Units by mouth daily with breakfast.   clopidogrel 75 MG tablet Commonly known as: PLAVIX Take 1 tablet (75 mg total) by mouth daily with breakfast.   ezetimibe 10 MG tablet Commonly known as: ZETIA Take 10 mg by mouth daily before breakfast.   ferrous sulfate 325 (65 FE) MG tablet Take 325 mg by mouth 2 (two) times daily with a meal.   Fish Oil 1000 MG Caps Take 4,000 mg by mouth daily with lunch.   fluticasone 50 MCG/ACT nasal spray Commonly known as: FLONASE Place 2 sprays into both nostrils daily as needed for allergies or rhinitis.   gabapentin 300 MG capsule Commonly known as: NEURONTIN Take 300 mg by mouth 3 (three) times daily.   HYDROcodone-acetaminophen 5-325 MG tablet Commonly known as: NORCO/VICODIN Take 1 tablet by mouth every 6 (six) hours as needed for severe pain.   insulin aspart 100 UNIT/ML injection Commonly known as: novoLOG Inject 0-6 Units into the skin 3 (three) times daily with meals.   levocetirizine 5 MG tablet Commonly known as: XYZAL Take 10 mg by mouth every morning.   levothyroxine 75 MCG tablet Commonly known as: SYNTHROID Take 75 mcg by mouth daily before breakfast.   metoprolol succinate 25 MG 24 hr tablet Commonly known as: TOPROL-XL Take 0.5 tablets (12.5 mg total) by mouth at bedtime. What changed:  medication strength how much to take when to take this   midodrine 10 MG tablet Commonly known as: PROAMATINE Take 1 tablet (10 mg total) by mouth 4 (four) times daily. What changed:  when to take this additional instructions   multivitamin Tabs tablet Take 1 tablet by mouth daily.   nitroGLYCERIN 0.4 MG SL tablet Commonly known as: NITROSTAT Place 0.4 mg under the tongue every 5 (five) minutes as needed for chest pain.   polyethylene glycol 17  g packet Commonly known as: MIRALAX / GLYCOLAX Take 17 g by mouth daily.   rosuvastatin 40 MG tablet Commonly known as: CRESTOR Take 40 mg by mouth at bedtime.   senna-docusate 8.6-50 MG tablet Commonly known as: Senokot-S Take 2 tablets by mouth 2 (two) times daily.   traMADol 50 MG tablet Commonly known as: ULTRAM Take 1 tablet (50 mg total) by mouth every 6 (six) hours as needed for moderate pain.        If you experience worsening of your admission symptoms, develop shortness of breath, life threatening emergency, suicidal or homicidal thoughts you must seek medical attention immediately by calling 911 or calling your MD immediately  if symptoms less severe.  You Must read complete instructions/literature along with all the possible adverse reactions/side effects for all the Medicines you take  and that have been prescribed to you. Take any new Medicines after you have completely understood and accept all the possible adverse reactions/side effects.   Please note  You were cared for by a hospitalist during your hospital stay. If you have any questions about your discharge medications or the care you received while you were in the hospital after you are discharged, you can call the unit and asked to speak with the hospitalist on call if the hospitalist that took care of you is not available. Once you are discharged, your primary care physician will handle any further medical issues. Please note that NO REFILLS for any discharge medications will be authorized once you are discharged, as it is imperative that you return to your primary care physician (or establish a relationship with a primary care physician if you do not have one) for your aftercare needs so that they can reassess your need for medications and monitor your lab values. Today   SUBJECTIVE   No new complaints  VITAL SIGNS:  Blood pressure 124/68, pulse 93, temperature 98.5 F (36.9 C), temperature source Oral, resp.  rate 18, height 6' (1.829 m), weight 97.5 kg, SpO2 99 %.  I/O:   Intake/Output Summary (Last 24 hours) at 01/06/2021 0833 Last data filed at 01/06/2021 0603 Gross per 24 hour  Intake 240 ml  Output 700 ml  Net -460 ml    PHYSICAL EXAMINATION:  GENERAL:  87 y.o.-year-old patient lying in the bed with no acute distress.  LUNGS: Normal breath sounds bilaterally, no wheezing, rales, rhonchi. No use of accessory muscles of respiration.  CARDIOVASCULAR: S1, S2 normal. No murmurs, rubs, or gallops.  ABDOMEN: Soft, nontender, nondistended. Bowel sounds present. No organomegaly or mass.  EXTREMITIES: No cyanosis, clubbing or edema b/l.  Right arm sling+  NEUROLOGIC: Cranial nerves II through XII are intact. No focal Motor or sensory deficits b/l.   PSYCHIATRIC:  patient is alert and oriented x 3.  SKIN: No obvious rash, lesion, or ulcer.    DATA REVIEW:   CBC  Recent Labs  Lab 01/01/21 0452  WBC 6.9  HGB 9.6*  HCT 28.0*  PLT 163    Chemistries  Recent Labs  Lab 01/03/21 0459  NA 133*  K 4.2  CL 95*  CO2 25  GLUCOSE 110*  BUN 52*  CREATININE 3.42*  CALCIUM 8.8*    Microbiology Results   Recent Results (from the past 240 hour(s))  Resp Panel by RT-PCR (Flu A&B, Covid) Nasopharyngeal Swab     Status: None   Collection Time: 01/05/21  2:54 PM   Specimen: Nasopharyngeal Swab; Nasopharyngeal(NP) swabs in vial transport medium  Result Value Ref Range Status   SARS Coronavirus 2 by RT PCR NEGATIVE NEGATIVE Final    Comment: (NOTE) SARS-CoV-2 target nucleic acids are NOT DETECTED.  The SARS-CoV-2 RNA is generally detectable in upper respiratory specimens during the acute phase of infection. The lowest concentration of SARS-CoV-2 viral copies this assay can detect is 138 copies/mL. A negative result does not preclude SARS-Cov-2 infection and should not be used as the sole basis for treatment or other patient management decisions. A negative result may occur with  improper  specimen collection/handling, submission of specimen other than nasopharyngeal swab, presence of viral mutation(s) within the areas targeted by this assay, and inadequate number of viral copies(<138 copies/mL). A negative result must be combined with clinical observations, patient history, and epidemiological information. The expected result is Negative.  Fact Sheet for Patients:  EntrepreneurPulse.com.au  Fact Sheet for Healthcare Providers:  IncredibleEmployment.be  This test is no t yet approved or cleared by the [place] FDA and  has been authorized for detection and/or diagnosis of SARS-CoV-2 by FDA under an Emergency Use Authorization (EUA). This EUA will remain  in effect (meaning this test can be used) for the duration of the COVID-19 declaration under Section 564(b)(1) of the Act, 21 U.S.C.section 360bbb-3(b)(1), unless the authorization is terminated  or revoked sooner.       Influenza A by PCR NEGATIVE NEGATIVE Final   Influenza B by PCR NEGATIVE NEGATIVE Final    Comment: (NOTE) The Xpert Xpress SARS-CoV-2/FLU/RSV plus assay is intended as an aid in the diagnosis of influenza from Nasopharyngeal swab specimens and should not be used as a sole basis for treatment. Nasal washings and aspirates are unacceptable for Xpert Xpress SARS-CoV-2/FLU/RSV testing.  Fact Sheet for Patients: EntrepreneurPulse.com.au  Fact Sheet for Healthcare Providers: IncredibleEmployment.be  This test is not yet approved or cleared by the [place] FDA and has been authorized for detection and/or diagnosis of SARS-CoV-2 by FDA under an Emergency Use Authorization (EUA). This EUA will remain in effect (meaning this test can be used) for the duration of the COVID-19 declaration under Section 564(b)(1) of the Act, 21 U.S.C. section 360bbb-3(b)(1), unless the authorization is terminated or revoked.  Performed at  [facility], [address], [place], [place] [address]     RADIOLOGY:  No results found.   CODE STATUS:     Code Status Orders  (From admission, onward)           Start     Ordered   12/25/20 1343  Do not attempt resuscitation (DNR)  Continuous       Question Answer Comment  In the event of cardiac or respiratory ARREST Do not call a "code blue"   In the event of cardiac or respiratory ARREST Do not perform Intubation, CPR, defibrillation or ACLS   In the event of cardiac or respiratory ARREST Use medication by any route, position, wound care, and other measures to relive pain and suffering. May use oxygen, suction and manual treatment of airway obstruction as needed for comfort.   Comments Code status was discussed with patient and he is a DNR      12/25/20 1342           Code Status History     Date Active Date Inactive Code Status Order ID Comments User Context   11/01/2020 1643 11/05/2020 2001 DNR [id number]  Kayleen Memos, DO ED   11/01/2020 1554 11/01/2020 1643 Full Code [id number]  Kayleen Memos, DO ED   11/01/2020 1553 11/01/2020 1554 Full Code [id number]  Kayleen Memos, DO ED   05/24/2019 0431 05/26/2019 1925 Full Code [id number]  Mansy, Arvella Merles, MD ED   02/15/2018 1910 02/17/2018 1529 DNR [id number]  Saundra Shelling, MD Inpatient   01/19/2018 0104 01/20/2018 1812 DNR [id number]  Amelia Jo, MD Inpatient   01/10/2018 1248 01/15/2018 1546 DNR [id number]  Loletha Grayer, MD Inpatient   01/10/2018 1224 01/10/2018 1248 Full Code [id number]  Loletha Grayer, MD Inpatient   12/09/2017 1835 12/11/2017 1508 Full Code [id number]  Loletha Grayer, MD Inpatient   12/06/2013 1125 12/07/2013 1536 Full Code [id number]  Belva Crome, MD Inpatient   12/04/2013 0955 12/06/2013 1125 Full Code [id number]  Leonie Man, MD Inpatient   12/03/2013 0407 12/04/2013 0955 Full Code [id number]  Rise Patience, MD Inpatient  Advance Directive Documentation    Flowsheet Row Most  Recent Value  Type of Advance Directive Healthcare Power of Attorney, Living will  Pre-existing out of facility DNR order (yellow form or pink MOST form) --  "MOST" Form in Place? --        TOTAL TIME TAKING CARE OF THIS PATIENT: 40 minutes.    Fritzi Mandes M.D  [facility]    CC: Primary care physician; Jodi Marble, MD

## 2021-01-06 NOTE — Progress Notes (Signed)
Patient discharged to facility via ems, report called to [facility].  Patient discharged with all pertinent information in package, personal belongings, and discharge instructions.  Daughter made aware and bedside.  No IV access.  Permcath intact.  No acute distress noted. Care relinquished.

## 2021-01-07 DIAGNOSIS — D631 Anemia in chronic kidney disease: Secondary | ICD-10-CM | POA: Diagnosis not present

## 2021-01-07 DIAGNOSIS — E1122 Type 2 diabetes mellitus with diabetic chronic kidney disease: Secondary | ICD-10-CM | POA: Diagnosis not present

## 2021-01-07 DIAGNOSIS — Z992 Dependence on renal dialysis: Secondary | ICD-10-CM | POA: Diagnosis not present

## 2021-01-07 DIAGNOSIS — N186 End stage renal disease: Secondary | ICD-10-CM | POA: Diagnosis not present

## 2021-01-08 DIAGNOSIS — S42301A Unspecified fracture of shaft of humerus, right arm, initial encounter for closed fracture: Secondary | ICD-10-CM | POA: Diagnosis not present

## 2021-01-08 DIAGNOSIS — E039 Hypothyroidism, unspecified: Secondary | ICD-10-CM | POA: Diagnosis not present

## 2021-01-08 DIAGNOSIS — I2510 Atherosclerotic heart disease of native coronary artery without angina pectoris: Secondary | ICD-10-CM | POA: Diagnosis not present

## 2021-01-08 DIAGNOSIS — N186 End stage renal disease: Secondary | ICD-10-CM | POA: Diagnosis not present

## 2021-01-08 DIAGNOSIS — Z992 Dependence on renal dialysis: Secondary | ICD-10-CM | POA: Diagnosis not present

## 2021-01-08 DIAGNOSIS — I509 Heart failure, unspecified: Secondary | ICD-10-CM | POA: Diagnosis not present

## 2021-01-08 DIAGNOSIS — I959 Hypotension, unspecified: Secondary | ICD-10-CM | POA: Diagnosis not present

## 2021-01-08 DIAGNOSIS — E119 Type 2 diabetes mellitus without complications: Secondary | ICD-10-CM | POA: Diagnosis not present

## 2021-01-12 DIAGNOSIS — M25511 Pain in right shoulder: Secondary | ICD-10-CM | POA: Diagnosis not present

## 2021-01-13 DIAGNOSIS — S42201A Unspecified fracture of upper end of right humerus, initial encounter for closed fracture: Secondary | ICD-10-CM | POA: Diagnosis not present

## 2021-01-14 ENCOUNTER — Encounter (INDEPENDENT_AMBULATORY_CARE_PROVIDER_SITE_OTHER): Payer: Self-pay

## 2021-01-15 DIAGNOSIS — E669 Obesity, unspecified: Secondary | ICD-10-CM | POA: Diagnosis not present

## 2021-01-15 DIAGNOSIS — E119 Type 2 diabetes mellitus without complications: Secondary | ICD-10-CM | POA: Diagnosis not present

## 2021-01-15 DIAGNOSIS — D649 Anemia, unspecified: Secondary | ICD-10-CM | POA: Diagnosis not present

## 2021-01-15 DIAGNOSIS — S42301A Unspecified fracture of shaft of humerus, right arm, initial encounter for closed fracture: Secondary | ICD-10-CM | POA: Diagnosis not present

## 2021-01-15 DIAGNOSIS — E039 Hypothyroidism, unspecified: Secondary | ICD-10-CM | POA: Diagnosis not present

## 2021-01-19 ENCOUNTER — Inpatient Hospital Stay: Admit: 2021-01-19 | Payer: Medicare Other

## 2021-01-21 ENCOUNTER — Telehealth (INDEPENDENT_AMBULATORY_CARE_PROVIDER_SITE_OTHER): Payer: Self-pay

## 2021-01-21 NOTE — Telephone Encounter (Signed)
WE likely need to move this out a few weeks.  Patient will not be able to tolerate full weight on that arm for a couple of weeks post surgery

## 2021-01-21 NOTE — Telephone Encounter (Signed)
Patient daughter will call to reschedule surgery after her father appointment with the orthopedic provider on 02/11/21

## 2021-01-22 ENCOUNTER — Inpatient Hospital Stay: Admission: RE | Admit: 2021-01-22 | Payer: Medicare Other | Source: Ambulatory Visit

## 2021-01-22 DIAGNOSIS — Z992 Dependence on renal dialysis: Secondary | ICD-10-CM | POA: Diagnosis not present

## 2021-01-22 DIAGNOSIS — N186 End stage renal disease: Secondary | ICD-10-CM | POA: Diagnosis not present

## 2021-01-22 DIAGNOSIS — E1022 Type 1 diabetes mellitus with diabetic chronic kidney disease: Secondary | ICD-10-CM | POA: Diagnosis not present

## 2021-01-23 ENCOUNTER — Other Ambulatory Visit: Payer: Medicare Other

## 2021-01-23 DIAGNOSIS — D509 Iron deficiency anemia, unspecified: Secondary | ICD-10-CM | POA: Diagnosis not present

## 2021-01-23 DIAGNOSIS — N186 End stage renal disease: Secondary | ICD-10-CM | POA: Diagnosis not present

## 2021-01-23 DIAGNOSIS — D631 Anemia in chronic kidney disease: Secondary | ICD-10-CM | POA: Diagnosis not present

## 2021-01-23 DIAGNOSIS — Z992 Dependence on renal dialysis: Secondary | ICD-10-CM | POA: Diagnosis not present

## 2021-01-26 DIAGNOSIS — R52 Pain, unspecified: Secondary | ICD-10-CM | POA: Diagnosis not present

## 2021-01-26 DIAGNOSIS — S42301A Unspecified fracture of shaft of humerus, right arm, initial encounter for closed fracture: Secondary | ICD-10-CM | POA: Diagnosis not present

## 2021-01-29 DIAGNOSIS — E119 Type 2 diabetes mellitus without complications: Secondary | ICD-10-CM | POA: Diagnosis not present

## 2021-01-29 DIAGNOSIS — N186 End stage renal disease: Secondary | ICD-10-CM | POA: Diagnosis not present

## 2021-01-29 DIAGNOSIS — S42201A Unspecified fracture of upper end of right humerus, initial encounter for closed fracture: Secondary | ICD-10-CM | POA: Diagnosis not present

## 2021-01-29 DIAGNOSIS — I2510 Atherosclerotic heart disease of native coronary artery without angina pectoris: Secondary | ICD-10-CM | POA: Diagnosis not present

## 2021-02-02 DIAGNOSIS — R52 Pain, unspecified: Secondary | ICD-10-CM | POA: Diagnosis not present

## 2021-02-02 DIAGNOSIS — E119 Type 2 diabetes mellitus without complications: Secondary | ICD-10-CM | POA: Diagnosis not present

## 2021-02-02 DIAGNOSIS — S42301A Unspecified fracture of shaft of humerus, right arm, initial encounter for closed fracture: Secondary | ICD-10-CM | POA: Diagnosis not present

## 2021-02-02 DIAGNOSIS — I2510 Atherosclerotic heart disease of native coronary artery without angina pectoris: Secondary | ICD-10-CM | POA: Diagnosis not present

## 2021-02-02 DIAGNOSIS — I959 Hypotension, unspecified: Secondary | ICD-10-CM | POA: Diagnosis not present

## 2021-02-02 DIAGNOSIS — E039 Hypothyroidism, unspecified: Secondary | ICD-10-CM | POA: Diagnosis not present

## 2021-02-02 DIAGNOSIS — Z992 Dependence on renal dialysis: Secondary | ICD-10-CM | POA: Diagnosis not present

## 2021-02-02 DIAGNOSIS — N186 End stage renal disease: Secondary | ICD-10-CM | POA: Diagnosis not present

## 2021-02-03 DIAGNOSIS — Z992 Dependence on renal dialysis: Secondary | ICD-10-CM | POA: Diagnosis not present

## 2021-02-03 DIAGNOSIS — N2581 Secondary hyperparathyroidism of renal origin: Secondary | ICD-10-CM | POA: Diagnosis not present

## 2021-02-03 DIAGNOSIS — N186 End stage renal disease: Secondary | ICD-10-CM | POA: Diagnosis not present

## 2021-02-04 DIAGNOSIS — Z23 Encounter for immunization: Secondary | ICD-10-CM | POA: Diagnosis not present

## 2021-02-05 ENCOUNTER — Ambulatory Visit: Admission: RE | Admit: 2021-02-05 | Payer: Medicare Other | Source: Home / Self Care | Admitting: Vascular Surgery

## 2021-02-05 ENCOUNTER — Encounter: Admission: RE | Payer: Self-pay | Source: Home / Self Care

## 2021-02-05 DIAGNOSIS — N186 End stage renal disease: Secondary | ICD-10-CM | POA: Diagnosis not present

## 2021-02-05 DIAGNOSIS — N2581 Secondary hyperparathyroidism of renal origin: Secondary | ICD-10-CM | POA: Diagnosis not present

## 2021-02-05 DIAGNOSIS — Z992 Dependence on renal dialysis: Secondary | ICD-10-CM | POA: Diagnosis not present

## 2021-02-05 SURGERY — INSERTION OF ARTERIOVENOUS (AV) GORE-TEX GRAFT ARM
Anesthesia: General | Laterality: Left

## 2021-02-07 DIAGNOSIS — Z992 Dependence on renal dialysis: Secondary | ICD-10-CM | POA: Diagnosis not present

## 2021-02-07 DIAGNOSIS — N186 End stage renal disease: Secondary | ICD-10-CM | POA: Diagnosis not present

## 2021-02-07 DIAGNOSIS — N2581 Secondary hyperparathyroidism of renal origin: Secondary | ICD-10-CM | POA: Diagnosis not present

## 2021-02-09 DIAGNOSIS — B029 Zoster without complications: Secondary | ICD-10-CM | POA: Diagnosis not present

## 2021-02-09 DIAGNOSIS — D649 Anemia, unspecified: Secondary | ICD-10-CM | POA: Diagnosis not present

## 2021-02-09 DIAGNOSIS — G4700 Insomnia, unspecified: Secondary | ICD-10-CM | POA: Diagnosis not present

## 2021-02-09 DIAGNOSIS — E1122 Type 2 diabetes mellitus with diabetic chronic kidney disease: Secondary | ICD-10-CM | POA: Diagnosis not present

## 2021-02-09 DIAGNOSIS — S42301D Unspecified fracture of shaft of humerus, right arm, subsequent encounter for fracture with routine healing: Secondary | ICD-10-CM | POA: Diagnosis not present

## 2021-02-09 DIAGNOSIS — I2510 Atherosclerotic heart disease of native coronary artery without angina pectoris: Secondary | ICD-10-CM | POA: Diagnosis not present

## 2021-02-09 DIAGNOSIS — L89159 Pressure ulcer of sacral region, unspecified stage: Secondary | ICD-10-CM | POA: Diagnosis not present

## 2021-02-09 DIAGNOSIS — E785 Hyperlipidemia, unspecified: Secondary | ICD-10-CM | POA: Diagnosis not present

## 2021-02-09 DIAGNOSIS — I4891 Unspecified atrial fibrillation: Secondary | ICD-10-CM | POA: Diagnosis not present

## 2021-02-09 DIAGNOSIS — I10 Essential (primary) hypertension: Secondary | ICD-10-CM | POA: Diagnosis not present

## 2021-02-09 DIAGNOSIS — I351 Nonrheumatic aortic (valve) insufficiency: Secondary | ICD-10-CM | POA: Diagnosis not present

## 2021-02-09 DIAGNOSIS — E032 Hypothyroidism due to medicaments and other exogenous substances: Secondary | ICD-10-CM | POA: Diagnosis not present

## 2021-02-10 DIAGNOSIS — N186 End stage renal disease: Secondary | ICD-10-CM | POA: Diagnosis not present

## 2021-02-10 DIAGNOSIS — Z992 Dependence on renal dialysis: Secondary | ICD-10-CM | POA: Diagnosis not present

## 2021-02-10 DIAGNOSIS — N2581 Secondary hyperparathyroidism of renal origin: Secondary | ICD-10-CM | POA: Diagnosis not present

## 2021-02-11 ENCOUNTER — Other Ambulatory Visit: Payer: Self-pay

## 2021-02-11 ENCOUNTER — Inpatient Hospital Stay
Admission: EM | Admit: 2021-02-11 | Discharge: 2021-02-17 | DRG: 535 | Disposition: A | Discharge status: Skilled Nursing Facility | Payer: Medicare Other | Attending: Internal Medicine | Admitting: Internal Medicine

## 2021-02-11 ENCOUNTER — Emergency Department: Payer: Medicare Other

## 2021-02-11 DIAGNOSIS — Z951 Presence of aortocoronary bypass graft: Secondary | ICD-10-CM

## 2021-02-11 DIAGNOSIS — Z7984 Long term (current) use of oral hypoglycemic drugs: Secondary | ICD-10-CM | POA: Diagnosis not present

## 2021-02-11 DIAGNOSIS — E7800 Pure hypercholesterolemia, unspecified: Secondary | ICD-10-CM | POA: Diagnosis not present

## 2021-02-11 DIAGNOSIS — S32591A Other specified fracture of right pubis, initial encounter for closed fracture: Secondary | ICD-10-CM | POA: Diagnosis present

## 2021-02-11 DIAGNOSIS — R600 Localized edema: Secondary | ICD-10-CM | POA: Diagnosis not present

## 2021-02-11 DIAGNOSIS — I951 Orthostatic hypotension: Secondary | ICD-10-CM | POA: Diagnosis not present

## 2021-02-11 DIAGNOSIS — W19XXXD Unspecified fall, subsequent encounter: Secondary | ICD-10-CM | POA: Diagnosis not present

## 2021-02-11 DIAGNOSIS — S32511A Fracture of superior rim of right pubis, initial encounter for closed fracture: Secondary | ICD-10-CM | POA: Diagnosis not present

## 2021-02-11 DIAGNOSIS — I252 Old myocardial infarction: Secondary | ICD-10-CM | POA: Diagnosis not present

## 2021-02-11 DIAGNOSIS — J309 Allergic rhinitis, unspecified: Secondary | ICD-10-CM | POA: Diagnosis not present

## 2021-02-11 DIAGNOSIS — B029 Zoster without complications: Secondary | ICD-10-CM | POA: Diagnosis not present

## 2021-02-11 DIAGNOSIS — F1090 Alcohol use, unspecified, uncomplicated: Secondary | ICD-10-CM | POA: Diagnosis not present

## 2021-02-11 DIAGNOSIS — S3289XA Fracture of other parts of pelvis, initial encounter for closed fracture: Secondary | ICD-10-CM | POA: Diagnosis not present

## 2021-02-11 DIAGNOSIS — Z20822 Contact with and (suspected) exposure to covid-19: Secondary | ICD-10-CM | POA: Diagnosis present

## 2021-02-11 DIAGNOSIS — W010XXA Fall on same level from slipping, tripping and stumbling without subsequent striking against object, initial encounter: Secondary | ICD-10-CM | POA: Diagnosis present

## 2021-02-11 DIAGNOSIS — D649 Anemia, unspecified: Secondary | ICD-10-CM | POA: Diagnosis not present

## 2021-02-11 DIAGNOSIS — Z955 Presence of coronary angioplasty implant and graft: Secondary | ICD-10-CM | POA: Diagnosis not present

## 2021-02-11 DIAGNOSIS — S32810A Multiple fractures of pelvis with stable disruption of pelvic ring, initial encounter for closed fracture: Secondary | ICD-10-CM | POA: Diagnosis not present

## 2021-02-11 DIAGNOSIS — Z66 Do not resuscitate: Secondary | ICD-10-CM | POA: Diagnosis present

## 2021-02-11 DIAGNOSIS — Z79899 Other long term (current) drug therapy: Secondary | ICD-10-CM

## 2021-02-11 DIAGNOSIS — I2510 Atherosclerotic heart disease of native coronary artery without angina pectoris: Secondary | ICD-10-CM | POA: Diagnosis present

## 2021-02-11 DIAGNOSIS — I5042 Chronic combined systolic (congestive) and diastolic (congestive) heart failure: Secondary | ICD-10-CM | POA: Diagnosis present

## 2021-02-11 DIAGNOSIS — Z880 Allergy status to penicillin: Secondary | ICD-10-CM | POA: Diagnosis not present

## 2021-02-11 DIAGNOSIS — Z87891 Personal history of nicotine dependence: Secondary | ICD-10-CM | POA: Diagnosis not present

## 2021-02-11 DIAGNOSIS — D631 Anemia in chronic kidney disease: Secondary | ICD-10-CM | POA: Diagnosis present

## 2021-02-11 DIAGNOSIS — B0229 Other postherpetic nervous system involvement: Secondary | ICD-10-CM | POA: Diagnosis present

## 2021-02-11 DIAGNOSIS — E1122 Type 2 diabetes mellitus with diabetic chronic kidney disease: Secondary | ICD-10-CM | POA: Diagnosis present

## 2021-02-11 DIAGNOSIS — I5022 Chronic systolic (congestive) heart failure: Secondary | ICD-10-CM | POA: Diagnosis not present

## 2021-02-11 DIAGNOSIS — L89312 Pressure ulcer of right buttock, stage 2: Secondary | ICD-10-CM | POA: Diagnosis present

## 2021-02-11 DIAGNOSIS — Z7401 Bed confinement status: Secondary | ICD-10-CM | POA: Diagnosis not present

## 2021-02-11 DIAGNOSIS — M25552 Pain in left hip: Secondary | ICD-10-CM | POA: Diagnosis not present

## 2021-02-11 DIAGNOSIS — S42301D Unspecified fracture of shaft of humerus, right arm, subsequent encounter for fracture with routine healing: Secondary | ICD-10-CM

## 2021-02-11 DIAGNOSIS — M25551 Pain in right hip: Secondary | ICD-10-CM | POA: Diagnosis not present

## 2021-02-11 DIAGNOSIS — R0902 Hypoxemia: Secondary | ICD-10-CM | POA: Diagnosis not present

## 2021-02-11 DIAGNOSIS — I480 Paroxysmal atrial fibrillation: Secondary | ICD-10-CM | POA: Diagnosis not present

## 2021-02-11 DIAGNOSIS — N2581 Secondary hyperparathyroidism of renal origin: Secondary | ICD-10-CM | POA: Diagnosis present

## 2021-02-11 DIAGNOSIS — I10 Essential (primary) hypertension: Secondary | ICD-10-CM | POA: Diagnosis not present

## 2021-02-11 DIAGNOSIS — Z992 Dependence on renal dialysis: Secondary | ICD-10-CM | POA: Diagnosis not present

## 2021-02-11 DIAGNOSIS — E039 Hypothyroidism, unspecified: Secondary | ICD-10-CM | POA: Diagnosis present

## 2021-02-11 DIAGNOSIS — Z888 Allergy status to other drugs, medicaments and biological substances status: Secondary | ICD-10-CM | POA: Diagnosis not present

## 2021-02-11 DIAGNOSIS — D638 Anemia in other chronic diseases classified elsewhere: Secondary | ICD-10-CM | POA: Diagnosis not present

## 2021-02-11 DIAGNOSIS — B028 Zoster with other complications: Secondary | ICD-10-CM | POA: Diagnosis not present

## 2021-02-11 DIAGNOSIS — Z8249 Family history of ischemic heart disease and other diseases of the circulatory system: Secondary | ICD-10-CM

## 2021-02-11 DIAGNOSIS — L8990 Pressure ulcer of unspecified site, unspecified stage: Secondary | ICD-10-CM | POA: Insufficient documentation

## 2021-02-11 DIAGNOSIS — I959 Hypotension, unspecified: Secondary | ICD-10-CM | POA: Diagnosis not present

## 2021-02-11 DIAGNOSIS — Z794 Long term (current) use of insulin: Secondary | ICD-10-CM

## 2021-02-11 DIAGNOSIS — I517 Cardiomegaly: Secondary | ICD-10-CM | POA: Diagnosis not present

## 2021-02-11 DIAGNOSIS — I132 Hypertensive heart and chronic kidney disease with heart failure and with stage 5 chronic kidney disease, or end stage renal disease: Secondary | ICD-10-CM | POA: Diagnosis present

## 2021-02-11 DIAGNOSIS — M25572 Pain in left ankle and joints of left foot: Secondary | ICD-10-CM | POA: Diagnosis not present

## 2021-02-11 DIAGNOSIS — L8942 Pressure ulcer of contiguous site of back, buttock and hip, stage 2: Secondary | ICD-10-CM | POA: Diagnosis not present

## 2021-02-11 DIAGNOSIS — S329XXD Fracture of unspecified parts of lumbosacral spine and pelvis, subsequent encounter for fracture with routine healing: Secondary | ICD-10-CM | POA: Diagnosis not present

## 2021-02-11 DIAGNOSIS — S329XXA Fracture of unspecified parts of lumbosacral spine and pelvis, initial encounter for closed fracture: Secondary | ICD-10-CM | POA: Diagnosis present

## 2021-02-11 DIAGNOSIS — S42291D Other displaced fracture of upper end of right humerus, subsequent encounter for fracture with routine healing: Secondary | ICD-10-CM | POA: Diagnosis not present

## 2021-02-11 DIAGNOSIS — S42209D Unspecified fracture of upper end of unspecified humerus, subsequent encounter for fracture with routine healing: Secondary | ICD-10-CM | POA: Diagnosis not present

## 2021-02-11 DIAGNOSIS — W19XXXA Unspecified fall, initial encounter: Secondary | ICD-10-CM

## 2021-02-11 DIAGNOSIS — S3282XA Multiple fractures of pelvis without disruption of pelvic ring, initial encounter for closed fracture: Secondary | ICD-10-CM | POA: Diagnosis not present

## 2021-02-11 DIAGNOSIS — M1990 Unspecified osteoarthritis, unspecified site: Secondary | ICD-10-CM | POA: Diagnosis not present

## 2021-02-11 DIAGNOSIS — Z7902 Long term (current) use of antithrombotics/antiplatelets: Secondary | ICD-10-CM

## 2021-02-11 DIAGNOSIS — Z79890 Hormone replacement therapy (postmenopausal): Secondary | ICD-10-CM

## 2021-02-11 DIAGNOSIS — N186 End stage renal disease: Secondary | ICD-10-CM | POA: Diagnosis not present

## 2021-02-11 DIAGNOSIS — Z85828 Personal history of other malignant neoplasm of skin: Secondary | ICD-10-CM

## 2021-02-11 DIAGNOSIS — K219 Gastro-esophageal reflux disease without esophagitis: Secondary | ICD-10-CM | POA: Diagnosis not present

## 2021-02-11 DIAGNOSIS — Z09 Encounter for follow-up examination after completed treatment for conditions other than malignant neoplasm: Secondary | ICD-10-CM

## 2021-02-11 LAB — CBC WITH DIFFERENTIAL/PLATELET
Abs Immature Granulocytes: 0.12 10*3/uL — ABNORMAL HIGH (ref 0.00–0.07)
Basophils Absolute: 0.1 10*3/uL (ref 0.0–0.1)
Basophils Relative: 1 %
Eosinophils Absolute: 0.5 10*3/uL (ref 0.0–0.5)
Eosinophils Relative: 5 %
HCT: 29.6 % — ABNORMAL LOW (ref 39.0–52.0)
Hemoglobin: 9.8 g/dL — ABNORMAL LOW (ref 13.0–17.0)
Immature Granulocytes: 1 %
Lymphocytes Relative: 8 %
Lymphs Abs: 0.7 10*3/uL (ref 0.7–4.0)
MCH: 31.8 pg (ref 26.0–34.0)
MCHC: 33.1 g/dL (ref 30.0–36.0)
MCV: 96.1 fL (ref 80.0–100.0)
Monocytes Absolute: 0.9 10*3/uL (ref 0.1–1.0)
Monocytes Relative: 10 %
Neutro Abs: 6.6 10*3/uL (ref 1.7–7.7)
Neutrophils Relative %: 75 %
Platelets: 153 10*3/uL (ref 150–400)
RBC: 3.08 MIL/uL — ABNORMAL LOW (ref 4.22–5.81)
RDW: 14.7 % (ref 11.5–15.5)
WBC: 9 10*3/uL (ref 4.0–10.5)
nRBC: 0 % (ref 0.0–0.2)

## 2021-02-11 LAB — COMPREHENSIVE METABOLIC PANEL
ALT: 27 U/L (ref 0–44)
AST: 33 U/L (ref 15–41)
Albumin: 3.2 g/dL — ABNORMAL LOW (ref 3.5–5.0)
Alkaline Phosphatase: 149 U/L — ABNORMAL HIGH (ref 38–126)
Anion gap: 9 (ref 5–15)
BUN: 36 mg/dL — ABNORMAL HIGH (ref 8–23)
CO2: 31 mmol/L (ref 22–32)
Calcium: 8.7 mg/dL — ABNORMAL LOW (ref 8.9–10.3)
Chloride: 100 mmol/L (ref 98–111)
Creatinine, Ser: 2.96 mg/dL — ABNORMAL HIGH (ref 0.61–1.24)
GFR, Estimated: 20 mL/min — ABNORMAL LOW (ref >60)
Glucose, Bld: 109 mg/dL — ABNORMAL HIGH (ref 70–99)
Potassium: 4.1 mmol/L (ref 3.5–5.1)
Sodium: 140 mmol/L (ref 135–145)
Total Bilirubin: 1 mg/dL (ref 0.3–1.2)
Total Protein: 6.3 g/dL — ABNORMAL LOW (ref 6.5–8.1)

## 2021-02-11 LAB — GLUCOSE, CAPILLARY: Glucose-Capillary: 121 mg/dL — ABNORMAL HIGH (ref 70–99)

## 2021-02-11 LAB — RESP PANEL BY RT-PCR (FLU A&B, COVID) ARPGX2
Influenza A by PCR: NEGATIVE
Influenza B by PCR: NEGATIVE
SARS Coronavirus 2 by RT PCR: NEGATIVE

## 2021-02-11 LAB — CBG MONITORING, ED
Glucose-Capillary: 208 mg/dL — ABNORMAL HIGH (ref 70–99)
Glucose-Capillary: 117 mg/dL — ABNORMAL HIGH (ref 70–99)

## 2021-02-11 LAB — PROTIME-INR
INR: 1.1 (ref 0.8–1.2)
Prothrombin Time: 13.8 seconds (ref 11.4–15.2)

## 2021-02-11 MED ORDER — VITAMIN D3 25 MCG (1000 UNIT) PO TABS
1000.0000 [IU] | ORAL_TABLET | Freq: Every day | ORAL | Status: DC
  Administered 2021-02-12 – 2021-02-17 (×6): 1000 [IU] via ORAL
  Filled 2021-02-11 (×12): qty 1

## 2021-02-11 MED ORDER — LEVOTHYROXINE SODIUM 75 MCG PO TABS
75.0000 ug | ORAL_TABLET | Freq: Every day | ORAL | Status: DC
  Administered 2021-02-12 – 2021-02-17 (×6): 75 ug via ORAL
  Filled 2021-02-11 (×4): qty 1
  Filled 2021-02-11 (×4): qty 3
  Filled 2021-02-11 (×4): qty 1

## 2021-02-11 MED ORDER — NITROGLYCERIN 0.4 MG SL SUBL: 0.4000 mg | SUBLINGUAL_TABLET | SUBLINGUAL | Status: DC | PRN

## 2021-02-11 MED ORDER — SODIUM CHLORIDE 0.9 % IV SOLN: 100.0000 mL | INTRAVENOUS | Status: DC | PRN

## 2021-02-11 MED ORDER — AMIODARONE HCL 200 MG PO TABS
200.0000 mg | ORAL_TABLET | Freq: Every day | ORAL | Status: DC
  Administered 2021-02-11 – 2021-02-17 (×7): 200 mg via ORAL
  Filled 2021-02-11 (×7): qty 1

## 2021-02-11 MED ORDER — CHLORHEXIDINE GLUCONATE CLOTH 2 % EX PADS
6.0000 | MEDICATED_PAD | Freq: Every day | CUTANEOUS | Status: DC
  Administered 2021-02-12 – 2021-02-17 (×6): 6 via TOPICAL
  Filled 2021-02-11: qty 6

## 2021-02-11 MED ORDER — ACETAMINOPHEN 500 MG PO TABS
1000.0000 mg | ORAL_TABLET | Freq: Once | ORAL | Status: AC
  Administered 2021-02-11: 1000 mg via ORAL
  Filled 2021-02-11: qty 2

## 2021-02-11 MED ORDER — BISACODYL 10 MG RE SUPP
10.0000 mg | Freq: Every day | RECTAL | Status: DC | PRN
  Filled 2021-02-11: qty 1

## 2021-02-11 MED ORDER — ROSUVASTATIN CALCIUM 10 MG PO TABS
40.0000 mg | ORAL_TABLET | Freq: Every day | ORAL | Status: DC
  Administered 2021-02-11 – 2021-02-16 (×6): 40 mg via ORAL
  Filled 2021-02-11 (×6): qty 4
  Filled 2021-02-11: qty 2

## 2021-02-11 MED ORDER — EZETIMIBE 10 MG PO TABS
10.0000 mg | ORAL_TABLET | Freq: Every day | ORAL | Status: DC
  Administered 2021-02-12 – 2021-02-16 (×5): 10 mg via ORAL
  Filled 2021-02-11 (×7): qty 1

## 2021-02-11 MED ORDER — TRAMADOL HCL 50 MG PO TABS
50.0000 mg | ORAL_TABLET | Freq: Four times a day (QID) | ORAL | Status: DC | PRN
  Administered 2021-02-12 – 2021-02-17 (×4): 50 mg via ORAL
  Filled 2021-02-11 (×4): qty 1

## 2021-02-11 MED ORDER — LIDOCAINE HCL (PF) 1 % IJ SOLN
5.0000 mL | INTRAMUSCULAR | Status: DC | PRN
  Filled 2021-02-11: qty 5

## 2021-02-11 MED ORDER — RENA-VITE PO TABS
1.0000 | ORAL_TABLET | Freq: Every day | ORAL | Status: DC
  Administered 2021-02-12 – 2021-02-16 (×5): 1 via ORAL
  Filled 2021-02-11 (×6): qty 1

## 2021-02-11 MED ORDER — MIDODRINE HCL 5 MG PO TABS
10.0000 mg | ORAL_TABLET | Freq: Four times a day (QID) | ORAL | Status: DC
  Administered 2021-02-11 – 2021-02-14 (×11): 10 mg via ORAL
  Filled 2021-02-11 (×13): qty 2

## 2021-02-11 MED ORDER — ACYCLOVIR 5 % EX OINT
1.0000 "application " | TOPICAL_OINTMENT | Freq: Every day | CUTANEOUS | Status: DC | PRN
  Filled 2021-02-11: qty 15

## 2021-02-11 MED ORDER — FENTANYL CITRATE PF 50 MCG/ML IJ SOSY
50.0000 ug | PREFILLED_SYRINGE | Freq: Once | INTRAMUSCULAR | Status: AC
  Administered 2021-02-11: 50 ug via INTRAVENOUS
  Filled 2021-02-11: qty 1

## 2021-02-11 MED ORDER — INSULIN ASPART 100 UNIT/ML IJ SOLN: 0.0000 [IU] | Freq: Three times a day (TID) | INTRAMUSCULAR | Status: DC

## 2021-02-11 MED ORDER — GABAPENTIN 300 MG PO CAPS
300.0000 mg | ORAL_CAPSULE | Freq: Three times a day (TID) | ORAL | Status: DC
  Administered 2021-02-11 – 2021-02-14 (×8): 300 mg via ORAL
  Filled 2021-02-11 (×8): qty 1

## 2021-02-11 MED ORDER — LIDOCAINE-PRILOCAINE 2.5-2.5 % EX CREA
1.0000 "application " | TOPICAL_CREAM | CUTANEOUS | Status: DC | PRN
  Filled 2021-02-11: qty 5

## 2021-02-11 MED ORDER — CLOPIDOGREL BISULFATE 75 MG PO TABS
75.0000 mg | ORAL_TABLET | Freq: Every day | ORAL | Status: DC
  Administered 2021-02-12 – 2021-02-17 (×6): 75 mg via ORAL
  Filled 2021-02-11 (×6): qty 1

## 2021-02-11 MED ORDER — HEPARIN SODIUM (PORCINE) 1000 UNIT/ML DIALYSIS
1000.0000 [IU] | INTRAMUSCULAR | Status: DC | PRN
  Filled 2021-02-11: qty 1

## 2021-02-11 MED ORDER — FERROUS SULFATE 325 (65 FE) MG PO TABS
325.0000 mg | ORAL_TABLET | Freq: Two times a day (BID) | ORAL | Status: DC
  Administered 2021-02-11 – 2021-02-17 (×11): 325 mg via ORAL
  Filled 2021-02-11 (×11): qty 1

## 2021-02-11 MED ORDER — SENNOSIDES-DOCUSATE SODIUM 8.6-50 MG PO TABS
2.0000 | ORAL_TABLET | Freq: Two times a day (BID) | ORAL | Status: DC
  Administered 2021-02-11 – 2021-02-17 (×12): 2 via ORAL
  Filled 2021-02-11 (×12): qty 2

## 2021-02-11 MED ORDER — HYDROCODONE-ACETAMINOPHEN 5-325 MG PO TABS
1.0000 | ORAL_TABLET | Freq: Four times a day (QID) | ORAL | Status: DC | PRN
Start: 2021-02-11 — End: 2021-02-17
  Administered 2021-02-12 – 2021-02-17 (×8): 1 via ORAL
  Filled 2021-02-11 (×10): qty 1

## 2021-02-11 MED ORDER — INSULIN ASPART 100 UNIT/ML IJ SOLN: 0.0000 [IU] | Freq: Every day | INTRAMUSCULAR | Status: DC

## 2021-02-11 MED ORDER — PENTAFLUOROPROP-TETRAFLUOROETH EX AERO
1.0000 | INHALATION_SPRAY | CUTANEOUS | Status: DC | PRN
Start: 2021-02-11 — End: 2021-02-12
  Filled 2021-02-11: qty 30

## 2021-02-11 MED ORDER — HEPARIN SODIUM (PORCINE) 5000 UNIT/ML IJ SOLN
5000.0000 [IU] | Freq: Three times a day (TID) | INTRAMUSCULAR | Status: DC
  Administered 2021-02-11 – 2021-02-17 (×19): 5000 [IU] via SUBCUTANEOUS
  Filled 2021-02-11 (×19): qty 1

## 2021-02-11 MED ORDER — POLYETHYLENE GLYCOL 3350 17 G PO PACK
17.0000 g | PACK | Freq: Every day | ORAL | Status: DC
  Administered 2021-02-12 – 2021-02-16 (×5): 17 g via ORAL
  Filled 2021-02-11 (×7): qty 1

## 2021-02-11 MED ORDER — LORATADINE 10 MG PO TABS
10.0000 mg | ORAL_TABLET | Freq: Every day | ORAL | Status: DC
  Administered 2021-02-11 – 2021-02-17 (×7): 10 mg via ORAL
  Filled 2021-02-11 (×7): qty 1

## 2021-02-11 MED ORDER — SODIUM CHLORIDE 0.9 % IV SOLN
100.0000 mL | INTRAVENOUS | Status: DC | PRN
Start: 2021-02-11 — End: 2021-02-12

## 2021-02-11 MED ORDER — INSULIN ASPART 100 UNIT/ML IJ SOLN
0.0000 [IU] | Freq: Three times a day (TID) | INTRAMUSCULAR | Status: DC
  Administered 2021-02-11: 3 [IU] via SUBCUTANEOUS
  Administered 2021-02-12 – 2021-02-13 (×2): 2 [IU] via SUBCUTANEOUS
  Administered 2021-02-14: 1 [IU] via SUBCUTANEOUS
  Filled 2021-02-11 (×4): qty 1

## 2021-02-11 MED ORDER — FLUTICASONE PROPIONATE 50 MCG/ACT NA SUSP
2.0000 | Freq: Every day | NASAL | Status: DC | PRN
  Filled 2021-02-11: qty 16

## 2021-02-11 MED ORDER — LEVOCETIRIZINE DIHYDROCHLORIDE 5 MG PO TABS: 10.0000 mg | ORAL_TABLET | Freq: Every morning | ORAL | Status: DC

## 2021-02-11 MED ORDER — ALTEPLASE 2 MG IJ SOLR
2.0000 mg | Freq: Once | INTRAMUSCULAR | Status: DC | PRN
Start: 2021-02-11 — End: 2021-02-12
  Filled 2021-02-11: qty 2

## 2021-02-11 NOTE — ED Provider Notes (Signed)
[facility] Emergency Department Provider Note  ____________________________________________   Event Date/Time   First MD Initiated Contact with Patient 02/11/21 [phone]     (approximate)  I have reviewed the triage vital signs and the nursing notes.   HISTORY  Chief Complaint Fall   HPI Matthew [name] is a 87 y.o. male history of DM, CAD, ESRD on dialysis, hypo-thyroidism, CHF, and recent admission for right humeral fracture who presents via EMS from home after he states he slipped while stepping off a scale falling on his right hip today.  Patient states he did not feel lightheaded dizzy or passed out before falling.  He did not hit his head and has no headache, neck pain, back pain, chest pain, abdominal pain or any other clear acute pain other than the right hip.  States he is still sore in his right upper arm from recent fall earlier this month but otherwise has no new pain in the extremities.  He is otherwise been in his usual state health without any recent fevers, chills, cough, nausea, vomiting, diarrhea, burning with urination, recently missed dialysis sessions or other acute complaints.  He does note he was started on acyclovir for shingles rash that came back couple days ago.         Past Medical History:  Diagnosis Date   Anemia    Arthritis    Broken arm 05/2013   Left   CHF (congestive heart failure) (HCC)    Chronic kidney disease    Coronary artery disease    Diabetes mellitus without complication (HCC)    Dyspnea    DOE   Edema    FEET/LEGS OCCAS   Hypertension    Myocardial infarction ([place])    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   left frontal scalp, [facility]   Squamous cell carcinoma of skin 05/05/2020   left temporal scalp, in situ, [facility] 05/13/20    Patient Active Problem List   Diagnosis Date Noted   AF (paroxysmal atrial fibrillation) (HCC)    ESRD (end stage renal disease) ([place])    Hypotension 12/26/2020   Tremor     Hypothyroidism    Chronic systolic CHF (congestive heart failure) ([place])    Fall 12/25/2020   Proximal humerus fracture 12/25/2020   Malposition of peritoneal dialysis catheter ([place]) 11/01/2020   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   Sepsis ([place]) 02/15/2018   Fluid overload 01/10/2018   Hypoglycemia 12/09/2017   Chronic kidney disease 07/18/2015   GERD (gastroesophageal reflux disease) 07/18/2015   CAD (coronary artery disease) 12/04/2013   Unstable angina; Class III Angina 12/03/2013   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 ([place]) 12/03/2013   HLD (hyperlipidemia) 12/03/2013   Heart disease 12/03/2013    Past Surgical History:  Procedure Laterality Date   CATARACT EXTRACTION W/PHACO Left 07/26/2017   Procedure: CATARACT EXTRACTION PHACO AND INTRAOCULAR LENS PLACEMENT ([place]);  Surgeon: Birder Robson, MD;  Location: [facility] ORS;  Service: Ophthalmology;  Laterality: Left;  [place]   00:45.8 AP%  13.1 CDE  6.00 Fluid Pack Lot # [id number]   CATARACT EXTRACTION W/PHACO Right 08/17/2017   Procedure: CATARACT EXTRACTION PHACO AND INTRAOCULAR LENS PLACEMENT (IOC);  Surgeon: Birder Robson, MD;  Location: [facility] ORS;  Service: Ophthalmology;  Laterality: Right;  [place]  01:30 AP% 16.8 CDE 15.24 Fluid pack lot # [id number]   CHOLECYSTECTOMY     CORONARY ANGIOPLASTY     STENTS   CORONARY ARTERY BYPASS GRAFT  DIALYSIS/PERMA CATHETER INSERTION N/A 02/09/2018   Procedure: DIALYSIS/PERMA CATHETER INSERTION;  Surgeon: Algernon Huxley, MD;  Location: [facility] CV LAB;  Service: Cardiovascular;  Laterality: N/A;   FRACTURE SURGERY     ARM   INSERTION OF DIALYSIS CATHETER Right 11/03/2020   Procedure: INSERTION OF Perm Cath in the RIGHT INTERNAL JUGULAR;  Surgeon: Algernon Huxley, MD;  Location: [facility] ORS;  Service: Vascular;  Laterality: Right;   LEFT HEART CATHETERIZATION WITH CORONARY/GRAFT ANGIOGRAM Left 12/04/2013   Procedure: LEFT HEART CATHETERIZATION WITH Beatrix Fetters;  Surgeon:  Leonie Man, MD;  Location: [facility] CATH LAB;  Service: Cardiovascular;  Laterality: Left;   PERCUTANEOUS CORONARY STENT INTERVENTION (PCI-S) N/A 12/06/2013   Procedure: PERCUTANEOUS CORONARY STENT INTERVENTION (PCI-S);  Surgeon: Sinclair Grooms, MD;  Location: [facility] CATH LAB;  Service: Cardiovascular;  Laterality: N/A;   REMOVAL OF A DIALYSIS CATHETER N/A 11/03/2020   Procedure: REMOVAL OF A DIALYSIS CATHETER;  Surgeon: Algernon Huxley, MD;  Location: [facility] ORS;  Service: Vascular;  Laterality: N/A;   VEIN BYPASS SURGERY     [place] Bypass Surgery     Prior to Admission medications   Medication Sig Start Date End Date Taking? Authorizing Provider  acyclovir ointment (ZOVIRAX) 5 % Apply 1 application topically daily as needed (shingles).    [provider]  amiodarone (PACERONE) 200 MG tablet Take 200 mg by mouth daily.    [provider]  bisacodyl (DULCOLAX) 10 MG suppository Place 1 suppository (10 mg total) rectally daily as needed for moderate constipation. 01/05/21   Fritzi Mandes, MD  cholecalciferol (VITAMIN D) 25 MCG (1000 UNIT) tablet Take 1,000 Units by mouth daily with breakfast.    [provider]  clopidogrel (PLAVIX) 75 MG tablet Take 1 tablet (75 mg total) by mouth daily with breakfast. 12/07/13   Almyra Deforest, PA  ezetimibe (ZETIA) 10 MG tablet Take 10 mg by mouth daily before breakfast.    [provider]  ferrous sulfate 325 (65 FE) MG tablet Take 325 mg by mouth 2 (two) times daily with a meal.     [provider]  fluticasone (FLONASE) 50 MCG/ACT nasal spray Place 2 sprays into both nostrils daily as needed for allergies or rhinitis.    [provider]  gabapentin (NEURONTIN) 300 MG capsule Take 300 mg by mouth 3 (three) times daily.    [provider]  HYDROcodone-acetaminophen (NORCO/VICODIN) 5-325 MG tablet Take 1 tablet by mouth every 6 (six) hours as needed for severe pain. 01/05/21   Fritzi Mandes, MD  insulin aspart  (NOVOLOG) 100 UNIT/ML injection Inject 0-6 Units into the skin 3 (three) times daily with meals. 01/05/21   Fritzi Mandes, MD  levocetirizine (XYZAL) 5 MG tablet Take 10 mg by mouth every morning.     [provider]  levothyroxine (SYNTHROID) 75 MCG tablet Take 75 mcg by mouth daily before breakfast.    [provider]  metoprolol succinate (TOPROL-XL) 25 MG 24 hr tablet Take 0.5 tablets (12.5 mg total) by mouth at bedtime. 01/05/21   Fritzi Mandes, MD  midodrine (PROAMATINE) 10 MG tablet Take 1 tablet (10 mg total) by mouth 4 (four) times daily. 01/05/21   Fritzi Mandes, MD  multivitamin (RENA-VIT) TABS tablet Take 1 tablet by mouth daily.    [provider]  nitroGLYCERIN (NITROSTAT) 0.4 MG SL tablet Place 0.4 mg under the tongue every 5 (five) minutes as needed for chest pain.    [provider]  Omega-3 Fatty Acids (FISH OIL) 1000 MG CAPS Take 4,000 mg by mouth daily with lunch.    [provider]  polyethylene glycol (MIRALAX / GLYCOLAX) 17 g packet Take 17 g by mouth daily. 01/06/21   Fritzi Mandes, MD  rosuvastatin (CRESTOR) 40 MG tablet Take 40 mg by mouth at bedtime. 04/21/19   [provider]  senna-docusate (SENOKOT-S) 8.6-50 MG tablet Take 2 tablets by mouth 2 (two) times daily. 01/05/21   Fritzi Mandes, MD  traMADol (ULTRAM) 50 MG tablet Take 1 tablet (50 mg total) by mouth every 6 (six) hours as needed for moderate pain. 01/05/21   Fritzi Mandes, MD    Allergies Other and Amoxicillin  Family History  Problem Relation Age of Onset   Heart attack Father 70       died first MI at age 68   Heart failure Mother     Social History Social History   Tobacco Use   Smoking status: Former    Types: Cigarettes   Smokeless tobacco: Never   Tobacco comments:    quit 30 yrs ago, smoked 14-15 yrs, mainly pipe  Vaping Use   Vaping Use: Never used  Substance Use Topics   Alcohol use: Not Currently    Alcohol/week: 1.0 standard drink    Types: 1  Cans of beer per week    Comment: occasionally drinking only,once a month   Drug use: No    Review of Systems  Review of Systems  Constitutional:  Negative for chills and fever.  HENT:  Negative for sore throat.   Eyes:  Negative for pain.  Respiratory:  Negative for cough and stridor.   Cardiovascular:  Negative for chest pain.  Gastrointestinal:  Negative for vomiting.  Genitourinary:  Negative for dysuria.  Musculoskeletal:  Positive for joint pain (R hip) and myalgias (R arm, subacute from recent fall, not worse today). Negative for falls.  Skin:  Positive for rash.  Neurological:  Negative for seizures, loss of consciousness and headaches.  Psychiatric/Behavioral:  Negative for suicidal ideas.   All other systems reviewed and are negative.    ____________________________________________   PHYSICAL EXAM:  VITAL SIGNS: ED Triage Vitals  Enc Vitals Group     BP      Pulse      Resp      Temp      Temp src      SpO2      Weight      Height      Head Circumference      Peak Flow      Pain Score      Pain Loc      Pain Edu?      Excl. in [place]?    Vitals:   02/11/21 0945 02/11/21 1000  BP: (!) 130/53 (!) 122/56  Pulse: 69 68  Resp: 11 14  Temp:    SpO2:  92%   Physical Exam Vitals and nursing note reviewed.  Constitutional:      Appearance: He is well-developed.  HENT:     Head: Normocephalic and atraumatic.     Right Ear: External ear normal.     Left Ear: External ear normal.     Nose: Nose normal.  Eyes:     Conjunctiva/sclera: Conjunctivae normal.  Cardiovascular:     Rate and Rhythm: Normal rate and regular rhythm.     Heart sounds: No murmur heard. Pulmonary:     Effort: Pulmonary effort is normal. No  respiratory distress.     Breath sounds: Normal breath sounds.  Abdominal:     Palpations: Abdomen is soft.     Tenderness: There is no abdominal tenderness.  Musculoskeletal:     Cervical back: Neck supple.  Skin:    General: Skin is warm  and dry.  Neurological:     Mental Status: He is alert and oriented to person, place, and time.  Psychiatric:        Mood and Affect: Mood normal.    Patient is able to give this examiner thumbs up on both hands and has symmetric grip strength.  Sensation is intact to light touch in the bilateral upper extremities although patient is very limited range of motion right elbow and shoulder which he states has been ongoing since his recent humerus fracture.  There is no tenderness step-offs or deformities over the C/T/L-spine.  There are some erythema over the right mid back and right chest consisting of some red spots.  No actively purulent vesicles or blistering.  Abdomen is soft nontender throughout.  Cranial nerves II through XII are grossly intact.  Patient has full strength and range of motion throughout the left lower extremity but is unable to flex at the right hip.  He is able to move toes on the right lower extremity.  2+ radial and DP pulses.  No other obvious acute trauma on exam.  Right chest port is in place. ____________________________________________   LABS (all labs ordered are listed, but only abnormal results are displayed)  Labs Reviewed  CBC WITH DIFFERENTIAL/PLATELET - Abnormal; Notable for the following components:      Result Value   RBC 3.08 (*)    Hemoglobin 9.8 (*)    HCT 29.6 (*)    Abs Immature Granulocytes 0.12 (*)    All other components within normal limits  COMPREHENSIVE METABOLIC PANEL - Abnormal; Notable for the following components:   Glucose, Bld 109 (*)    BUN 36 (*)    Creatinine, Ser 2.96 (*)    Calcium 8.7 (*)    Total Protein 6.3 (*)    Albumin 3.2 (*)    Alkaline Phosphatase 149 (*)    GFR, Estimated 20 (*)    All other components within normal limits  RESP PANEL BY RT-PCR (FLU A&B, COVID) [id number]  PROTIME-INR  TYPE AND SCREEN    ____________________________________________  EKG  ____________________________________________  RADIOLOGY  ED MD interpretation: Chest x-ray remarkable for small left pleural effusion and chronic cardiomegaly without evidence of other acute thoracic abnormality.  Plain film of the left hip shows no acute fracture dislocation.  Plain film of the right hip remarkable for superior-inferior pubic rami fractures without other hip fracture noted.   Official radiology report(s): DG Chest 1 View  Result Date: 02/11/2021 CLINICAL DATA:  87 year old male status post fall. EXAM: CHEST  1 VIEW COMPARISON:  Chest radiographs 11/03/2020 and earlier. FINDINGS: AP view at 0906 hours. New dual lumen dialysis type right chest catheter. Chronic cardiomegaly and tortuosity of the thoracic aorta. Prior CABG. Stable cardiac size and mediastinal contours. Visualized tracheal air column is within normal limits. No pneumothorax, pulmonary edema. The right lung remains clear. Increased hypo ventilation at the left lung base. Small left pleural effusion difficult to exclude. No consolidation. No acute osseous abnormality identified. IMPRESSION: 1. Difficult to exclude small left pleural effusion. But otherwise no acute cardiopulmonary abnormality or acute traumatic injury identified. 2. Chronic cardiomegaly, tortuous aorta. Electronically Signed   By: Lemmie Evens  [place] Crane M.D.   On: 02/11/2021 09:55   DG HIP UNILAT WITH PELVIS 2-3 VIEWS LEFT  Result Date: 02/11/2021 CLINICAL DATA:  Left hip pain after fall. EXAM: DG HIP (WITH OR WITHOUT PELVIS) 2-3V LEFT COMPARISON:  None. FINDINGS: There is no evidence of hip fracture or dislocation. No significant joint space narrowing is noted. Mild osteophyte formation is noted. 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 No acute abnormality is noted. Electronically Signed   By: Marijo Conception M.D.   On: 02/11/2021 09:57   DG Hip Unilat  With Pelvis 2-3 Views Right  Result  Date: 02/11/2021 CLINICAL DATA:  Status post fall. EXAM: DG HIP (WITH OR WITHOUT PELVIS) 2-3V RIGHT COMPARISON:  None FINDINGS: The bones appear diffusely osteopenic. There is no evidence of hip fracture or dislocation. There are acute right superior and inferior pubic rami fractures. Mild degenerative changes are noted involving the right hip. IMPRESSION: Acute right superior and inferior pubic rami fractures. Electronically Signed   By: Kerby Moors M.D.   On: 02/11/2021 09:56    ____________________________________________   PROCEDURES  Procedure(s) performed (including Critical Care):  Procedures   ____________________________________________   INITIAL IMPRESSION / ASSESSMENT AND PLAN / ED COURSE        Patient presents with above-stated history and exam for assessment of right hip pain after mechanical ground-level fall described above.  On arrival he is afebrile hemodynamically stable.  He does have some pain in the right hip on range of motion and is weaker in the right hip.  He also has some pain in his right humerus from fall several days ago and has had to have a rash consistent with recent shingles recurrence.  He is otherwise neurovascularly intact without other evidence of significant trauma.  Differential considered fracture, leg fracture, contusion and hematoma in the right leg.   Chest x-ray remarkable for small left pleural effusion and chronic cardiomegaly without evidence of other acute thoracic abnormality.  Plain film of the left hip shows no acute fracture dislocation.  Plain film of the right hip remarkable for superior-inferior pubic rami fractures without other hip fracture noted.   CMP shows no significant lecture light derangements and kidney function consistent with patient's history of ESRD.  CBC shows no leukocytosis and stable hemoglobin.  INR is within normal limits  I discussed with on-call orthopedist Dr. Sharlet Salina patient's acute fractures and  recommended hospitalist admission for PT OT and pain control with recommendation for no acute surgical interventions at this time.  I will admit to medicine service for further evaluation and management.     ____________________________________________   FINAL CLINICAL IMPRESSION(S) / ED DIAGNOSES  Final diagnoses:  Multiple closed fractures of pelvis without disruption of pelvic ring, initial encounter (HCC)    Medications  acetaminophen (TYLENOL) tablet 1,000 mg (has no administration in time range)  fentaNYL (SUBLIMAZE) injection 50 mcg (50 mcg Intravenous Given 02/11/21 0940)     ED Discharge Orders     None        Note:  This document was prepared using Dragon voice recognition software and may include unintentional dictation errors.    Lucrezia Starch, MD 02/11/21 1051

## 2021-02-11 NOTE — H&P (Signed)
[facility] [place] NAME: Matthew [name]    MR#:  [id number]  DATE OF BIRTH:  [birth year]  DATE OF ADMISSION:  02/11/2021  Patient comes from home  PRIMARY CARE PHYSICIAN: Jodi Marble, MD   REQUESTING/REFERRING PHYSICIAN: Hulan Saas MD  CHIEF COMPLAINT:   Chief Complaint  Patient presents with   Fall    HISTORY OF PRESENT ILLNESS:  Matthew [name]  is a 87 y.o. male with a known history of DM type II, ESRD on hemodialysis (Tuesday/Thursday/Saturday), CAD s/p CABG, hypertension, hypothyroidism, history of CHF who was recently admitted for right humeral fracture is being readmitted for pubic rami fracture status post fall.  Patient slipped while stepping off a scale falling on his right hip and subsequently pain at the right hip area.  He denies any head injury.  ED course: Right hip x-ray shows superior-inferior pubic rami fracture.  Case was discussed with orthopedic Dr. Sharlet Salina who requested admission to the hospital, PT, OT eval and pain control.  Patient will likely need SNF placement at discharge.  Patient's pain is controlled at this time. PAST MEDICAL HISTORY:   Past Medical History:  Diagnosis Date   Anemia    Arthritis    Broken arm 05/2013   Left   CHF (congestive heart failure) (HCC)    Chronic kidney disease    Coronary artery disease    Diabetes mellitus without complication (HCC)    Dyspnea    DOE   Edema    FEET/LEGS OCCAS   Hypertension    Myocardial infarction ([place])    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   left frontal scalp, [facility]   Squamous cell carcinoma of skin 05/05/2020   left temporal scalp, in situ, [facility] 05/13/20   PAST SURGICAL HISTORY:   Past Surgical History:  Procedure Laterality Date   CATARACT EXTRACTION W/PHACO Left 07/26/2017   Procedure: CATARACT EXTRACTION PHACO AND INTRAOCULAR LENS PLACEMENT ([place]);  Surgeon: Birder Robson, MD;  Location: [facility] ORS;  Service: Ophthalmology;  Laterality: Left;   [place]   00:45.8 AP%  13.1 CDE  6.00 Fluid Pack Lot # [id number]   CATARACT EXTRACTION W/PHACO Right 08/17/2017   Procedure: CATARACT EXTRACTION PHACO AND INTRAOCULAR LENS PLACEMENT ([facility]);  Surgeon: Birder Robson, MD;  Location: [facility] ORS;  Service: Ophthalmology;  Laterality: Right;  [place]  01:30 AP% 16.8 CDE 15.24 Fluid pack lot # [id number]   CHOLECYSTECTOMY     CORONARY ANGIOPLASTY     STENTS   CORONARY ARTERY BYPASS GRAFT     DIALYSIS/PERMA CATHETER INSERTION N/A 02/09/2018   Procedure: DIALYSIS/PERMA CATHETER INSERTION;  Surgeon: Algernon Huxley, MD;  Location: [facility] [facility];  Service: Cardiovascular;  Laterality: N/A;   FRACTURE SURGERY     ARM   INSERTION OF DIALYSIS CATHETER Right 11/03/2020   Procedure: INSERTION OF Perm Cath in the RIGHT INTERNAL JUGULAR;  Surgeon: Algernon Huxley, MD;  Location: [facility] ORS;  Service: Vascular;  Laterality: Right;   LEFT HEART CATHETERIZATION WITH CORONARY/GRAFT ANGIOGRAM Left 12/04/2013   Procedure: LEFT HEART CATHETERIZATION WITH Beatrix Fetters;  Surgeon: Leonie Man, MD;  Location: [facility] CATH LAB;  Service: Cardiovascular;  Laterality: Left;   PERCUTANEOUS CORONARY STENT INTERVENTION (PCI-S) N/A 12/06/2013   Procedure: PERCUTANEOUS CORONARY STENT INTERVENTION (PCI-S);  Surgeon: Sinclair Grooms, MD;  Location: [facility] CATH LAB;  Service: Cardiovascular;  Laterality: N/A;   REMOVAL OF A DIALYSIS CATHETER N/A 11/03/2020   Procedure: REMOVAL OF A DIALYSIS CATHETER;  Surgeon: Algernon Huxley, MD;  Location: [facility] ORS;  Service: Vascular;  Laterality: N/A;   VEIN BYPASS SURGERY     [place] Bypass Surgery    SOCIAL HISTORY:   Social History   Tobacco Use   Smoking status: Former    Types: Cigarettes   Smokeless tobacco: Never   Tobacco comments:    quit 30 yrs ago, smoked 14-15 yrs, mainly pipe  Substance Use Topics   Alcohol use: Not Currently    Alcohol/week: 1.0 standard drink    Types: 1 Cans of beer per week    Comment: occasionally  drinking only,once a month   FAMILY HISTORY:   Family History  Problem Relation Age of Onset   Heart attack Father 7       died first MI at age 34   Heart failure Mother    DRUG ALLERGIES:   Allergies  Allergen Reactions   Other     Other reaction(s): Myalgias (intolerance), Other (See Comments) Statin Drugs  Statin Drugs     Amoxicillin Diarrhea   REVIEW OF SYSTEMS:  Review of Systems  Constitutional:  Negative for diaphoresis, fever, malaise/fatigue and weight loss.  HENT:  Negative for ear discharge, ear pain, hearing loss, nosebleeds, sore throat and tinnitus.   Eyes:  Negative for blurred vision and pain.  Respiratory:  Negative for cough, hemoptysis, shortness of breath and wheezing.   Cardiovascular:  Negative for chest pain, palpitations, orthopnea and leg swelling.  Gastrointestinal:  Negative for abdominal pain, blood in stool, constipation, diarrhea, heartburn, nausea and vomiting.  Genitourinary:  Negative for dysuria, frequency and urgency.  Musculoskeletal:  Positive for falls, joint pain and myalgias. Negative for back pain.  Skin:  Positive for rash. Negative for itching.  Neurological:  Negative for dizziness, tingling, tremors, focal weakness, seizures, weakness and headaches.  Psychiatric/Behavioral:  Negative for depression. The patient is not nervous/anxious.   MEDICATIONS AT HOME:   Prior to Admission medications   Medication Sig Start Date End Date Taking? Authorizing Provider  acyclovir ointment (ZOVIRAX) 5 % Apply 1 application topically daily as needed (shingles).   Yes [provider]  amiodarone (PACERONE) 200 MG tablet Take 200 mg by mouth daily.   Yes [provider]  cholecalciferol (VITAMIN D) 25 MCG (1000 UNIT) tablet Take 1,000 Units by mouth daily with breakfast.   Yes [provider]  clopidogrel (PLAVIX) 75 MG tablet Take 1 tablet (75 mg total) by mouth daily with breakfast. 12/07/13  Yes Almyra Deforest, PA   ezetimibe (ZETIA) 10 MG tablet Take 10 mg by mouth daily before breakfast.   Yes [provider]  ferrous sulfate 325 (65 FE) MG tablet Take 325 mg by mouth 2 (two) times daily with a meal.    Yes [provider]  furosemide (LASIX) 40 MG tablet Take 40 mg by mouth.   Yes [provider]  gabapentin (NEURONTIN) 300 MG capsule Take 300 mg by mouth 3 (three) times daily.   Yes [provider]  glipiZIDE (GLUCOTROL) 5 MG tablet Take 2.5 mg by mouth daily at 12 noon. 01/13/21  Yes [provider]  levocetirizine (XYZAL) 5 MG tablet Take 10 mg by mouth every morning.    Yes [provider]  levothyroxine (SYNTHROID) 88 MCG tablet Take 88 mcg by mouth daily before breakfast.   Yes [provider]  metoprolol succinate (TOPROL-XL) 25 MG 24 hr tablet Take 0.5 tablets (12.5 mg total) by mouth at bedtime. 01/05/21  Yes  Fritzi Mandes, MD  midodrine (PROAMATINE) 10 MG tablet Take 1 tablet (10 mg total) by mouth 4 (four) times daily. 01/05/21  Yes Fritzi Mandes, MD  multivitamin (RENA-VIT) TABS tablet Take 1 tablet by mouth daily.   Yes [provider]  Omega-3 Fatty Acids (FISH OIL) 1000 MG CAPS Take 4,000 mg by mouth daily with lunch.   Yes [provider]  potassium chloride SA (KLOR-CON) 20 MEQ tablet Take 20 mEq by mouth daily.   Yes [provider]  rosuvastatin (CRESTOR) 40 MG tablet Take 40 mg by mouth at bedtime. 04/21/19  Yes [provider]  valACYclovir (VALTREX) 1000 MG tablet Take 1,000 mg by mouth daily. 02/09/21  Yes [provider]  bisacodyl (DULCOLAX) 10 MG suppository Place 1 suppository (10 mg total) rectally daily as needed for moderate constipation. 01/05/21   Fritzi Mandes, MD  fluticasone [facility]) 50 MCG/ACT nasal spray Place 2 sprays into both nostrils daily as needed for allergies or rhinitis.    [provider]  HYDROcodone-acetaminophen (NORCO/VICODIN) 5-325 MG tablet Take 1  tablet by mouth every 6 (six) hours as needed for severe pain. Patient not taking: Reported on 02/11/2021 01/05/21   Fritzi Mandes, MD  insulin aspart (NOVOLOG) 100 UNIT/ML injection Inject 0-6 Units into the skin 3 (three) times daily with meals. Patient not taking: Reported on 02/11/2021 01/05/21   Fritzi Mandes, MD  nitroGLYCERIN (NITROSTAT) 0.4 MG SL tablet Place 0.4 mg under the tongue every 5 (five) minutes as needed for chest pain.    [provider]  polyethylene glycol (MIRALAX / GLYCOLAX) 17 g packet Take 17 g by mouth daily. 01/06/21   Fritzi Mandes, MD  senna-docusate (SENOKOT-S) 8.6-50 MG tablet Take 2 tablets by mouth 2 (two) times daily. 01/05/21   Fritzi Mandes, MD  traMADol (ULTRAM) 50 MG tablet Take 1 tablet (50 mg total) by mouth every 6 (six) hours as needed for moderate pain. Patient not taking: Reported on 02/11/2021 01/05/21   Fritzi Mandes, MD    VITAL SIGNS:  Blood pressure (!) 106/53, pulse 71, temperature 98.1 F (36.7 C), resp. rate 20, height 6' (1.829 m), weight 94.8 kg, SpO2 93 %. PHYSICAL EXAMINATION:  Physical Exam  GENERAL:  87 y.o.-year-old patient lying in the bed with no acute distress.  EYES: Pupils equal, round, reactive to light and accommodation. No scleral icterus. Extraocular muscles intact.  HEENT: Head atraumatic, normocephalic. Oropharynx and nasopharynx clear.  NECK:  Supple, no jugular venous distention. No thyroid enlargement, no tenderness.  LUNGS: Normal breath sounds bilaterally, no wheezing, rales,rhonchi or crepitation. No use of accessory muscles of respiration.  CARDIOVASCULAR: S1, S2 normal. No murmurs, rubs, or gallops.  ABDOMEN: Soft, nontender, nondistended. Bowel sounds present. No organomegaly or mass.  EXTREMITIES: Tenderness in the right hip area and right arm from recent fracture.  NEUROLOGIC: Cranial nerves II through XII are intact. Muscle strength 5/5 in all extremities. Sensation intact. Gait not checked.  PSYCHIATRIC: The patient  is alert and oriented x 3.  SKIN: Post herpetic rash LABORATORY PANEL:   CBC Recent Labs  Lab 02/11/21 0847  WBC 9.0  HGB 9.8*  HCT 29.6*  PLT 153   ------------------------------------------------------------------------------------------------------------------  Chemistries  Recent Labs  Lab 02/11/21 0847  NA 140  K 4.1  CL 100  CO2 31  GLUCOSE 109*  BUN 36*  CREATININE 2.96*  CALCIUM 8.7*  AST 33  ALT 27  ALKPHOS 149*  BILITOT 1.0   ------------------------------------------------------------------------------------------------------------------  Cardiac Enzymes No results  for input(s): TROPONINI in the last 168 hours. ------------------------------------------------------------------------------------------------------------------  RADIOLOGY:  DG Chest 1 View  Result Date: 02/11/2021 CLINICAL DATA:  87 year old male status post fall. EXAM: CHEST  1 VIEW COMPARISON:  Chest radiographs 11/03/2020 and earlier. FINDINGS: AP view at 0906 hours. New dual lumen dialysis type right chest catheter. Chronic cardiomegaly and tortuosity of the thoracic aorta. Prior CABG. Stable cardiac size and mediastinal contours. Visualized tracheal air column is within normal limits. No pneumothorax, pulmonary edema. The right lung remains clear. Increased hypo ventilation at the left lung base. Small left pleural effusion difficult to exclude. No consolidation. No acute osseous abnormality identified. IMPRESSION: 1. Difficult to exclude small left pleural effusion. But otherwise no acute cardiopulmonary abnormality or acute traumatic injury identified. 2. Chronic cardiomegaly, tortuous aorta. Electronically Signed   By: Genevie Ann M.D.   On: 02/11/2021 09:55   DG HIP UNILAT WITH PELVIS 2-3 VIEWS LEFT  Result Date: 02/11/2021 CLINICAL DATA:  Left hip pain after fall. EXAM: DG HIP (WITH OR WITHOUT PELVIS) 2-3V LEFT COMPARISON:  None. FINDINGS: There is no evidence of hip fracture or dislocation.  No significant joint space narrowing is noted. Mild osteophyte formation is noted. 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 No acute abnormality is noted. Electronically Signed   By: Marijo Conception M.D.   On: 02/11/2021 09:57   DG Hip Unilat  With Pelvis 2-3 Views Right  Result Date: 02/11/2021 CLINICAL DATA:  Status post fall. EXAM: DG HIP (WITH OR WITHOUT PELVIS) 2-3V RIGHT COMPARISON:  None FINDINGS: The bones appear diffusely osteopenic. There is no evidence of hip fracture or dislocation. There are acute right superior and inferior pubic rami fractures. Mild degenerative changes are noted involving the right hip. IMPRESSION: Acute right superior and inferior pubic rami fractures. Electronically Signed   By: Kerby Moors M.D.   On: 02/11/2021 09:56      IMPRESSION AND PLAN:  87 year old male with a known history of type 2 diabetes, CAD, ESRD on dialysis, hypothyroidism, history of CHF who was recently admitted for right humeral fracture is being readmitted for pubic rami fracture status post fall.  Pubic rami fracture Ortho consult, pain management -PT, OT eval once Ortho input is in place -[facility] consult for SNF placement  ESRD on HD (T/TH/Sat) -Nephro consult  CAD status post CABG Continue Plavix, Crestor, Zetia  Hypothyroidism Continue Synthroid  Chronic systolic heart failure Well compensated at this time.  Type 2 diabetes mellitus with ESRD Last A1c of 6.3 in 10/2020 SSI for now  Anemia of chronic kidney disease Stable hemoglobin  History of orthostatic hypotension Midodrine 10 mg po QID    Status is: Observation  The patient remains OBS appropriate and will d/c before 2 midnights.  Dispo: The patient is from: Home              Anticipated d/c is to: Home              Patient currently is not medically stable to d/c.   Difficult to place patient No    DVT prophylaxis:       heparin injection 5,000 Units Start: 02/11/21 1400 SCDs Start:  02/11/21 1057     Family Communication: Updated Daughter Horris Latino over phone  All the records are reviewed and case discussed with ED provider. Management plans discussed with the patient, family and they are in agreement.  CODE STATUS: DNR  TOTAL TIME TAKING CARE OF THIS PATIENT: 45 minutes.  Max Sane M.D on 02/11/2021 at 2:45 PM  [facility]   CC: Primary care physician; Jodi Marble, MD   Note: This dictation was prepared with Dragon dictation along with smaller phrase technology. Any transcriptional errors that result from this process are unintentional.

## 2021-02-11 NOTE — ED Triage Notes (Addendum)
Pt comes via EMS from home with c/o mechanical fall while stepping off scale. No LOC or hitting head. Pt already had right humerus fracture from previous fall.  Pt c/o right hip pain. Pt on blood thinners. Hx of aortic dissection and dialysis pt.  VSS  Pt does have shingles

## 2021-02-11 NOTE — ED Notes (Signed)
Pt given lunch tray.

## 2021-02-11 NOTE — ED Notes (Signed)
PT assessing pt at this time.  

## 2021-02-11 NOTE — ED Notes (Signed)
Pt given decaf coffee and snack per POA request.

## 2021-02-11 NOTE — ED Notes (Addendum)
Rn to bedside to assess patient. Pt sleeping but easy to wake, denying shortness of breath and interacting appropriately. Spo2 monitor and BP cuff readjusted and vitals assessed.

## 2021-02-11 NOTE — Evaluation (Signed)
Physical Therapy Evaluation Patient Details Name: Matthew [name] MRN: [id number] DOB: [birth year] Today's Date: 02/11/2021  History of Present Illness  Matthew [name] is a 87 y.o. male who was here in early August with fall and R humeral fracture, went to rehab and now here with another fall and pelvic fracture.  Medical history significant for diabetes mellitus with complications of end-stage renal disease on hemodialysis (dialysis days are Tuesday/Thursday/Saturday), coronary artery disease status post CABG, hypertension.  Clinical Impression  Pt still having quite a bit of acute pain but was willing to participate.  He needed assist with bed mobility (to/from supine/sitting) and had R hip/groin pain with any movement.  Attempts at standing required at least mod assist to attain upright and pain with WBing did not allow for very much weight shift/stepping/mobility and despite great effort pt ultimately needed to sit back down having only shuffled along the EOB <6".  He apparently has been able to be generally active until his fall early last month, okayed to start bearing weight through R UE by ortho as he is now >6 weeks post fx, will trial standing/WBing with walker (platform?) next visit.      Recommendations for follow up therapy are one component of a multi-disciplinary discharge planning process, led by the attending physician.  Recommendations may be updated based on patient status, additional functional criteria and insurance authorization.  Follow Up Recommendations SNF    Equipment Recommendations   (TBD at next venue)    Recommendations for Other Services       Precautions / Restrictions Precautions Precautions: Fall Restrictions Weight Bearing Restrictions: Yes RUE Weight Bearing: Weight bearing as tolerated (per ortho now >6 weeks out, okay to bear WBAT through UE) RLE Weight Bearing: Weight bearing as tolerated      Mobility  Bed Mobility Overal bed mobility: Needs  Assistance Bed Mobility: Sidelying to Sit;Rolling;Sit to Supine Rolling: Min assist Sidelying to sit: Min assist;Mod assist   Sit to supine: Mod assist;Max assist   General bed mobility comments: Pt unable to use R UE, pain with R LE movement, showed good effort but ultimately needed considerable assist    Transfers Overall transfer level: Needs assistance Equipment used: Hemi-walker Transfers: Sit to/from Stand Sit to Stand: Mod assist         General transfer comment: X 3, unsuccessful with min assist on first attempt.  Mod assist on second only allowed for brief standing effort.  Increased cuing and assist on final attempt did attain standing for more than a few seconds, however with much pain, reliance on the walker and generally poor safety  Ambulation/Gait             General Gait Details: unable to ambulation, made great effort with attempting side stepping along EOB, struggled with being able to put weight through the R LE to move the L  Stairs            Wheelchair Mobility    Modified Rankin (Stroke Patients Only)       Balance Overall balance assessment: Needs assistance Sitting-balance support: Single extremity supported Sitting balance-Leahy Scale: Good     Standing balance support: Single extremity supported Standing balance-Leahy Scale: Poor Standing balance comment: pt leaning heavily to the L and needing constant direct assist to maintain upright                             Pertinent Vitals/Pain Pain Assessment:  0-10 Pain Score: 8  Pain Location: reports little to no pain at rest, a lot with R LE movement, groin/adductors    Home Living Family/patient expects to be discharged to:: Skilled nursing facility                      Prior Function Level of Independence: Independent with assistive device(s)         Comments: aopparently prior to fall 7 weeks ago he needed the cane but was out walking almost every day      Hand Dominance        Extremity/Trunk Assessment   Upper Extremity Assessment Upper Extremity Assessment: Generalized weakness (R in sling)    Lower Extremity Assessment Lower Extremity Assessment: Generalized weakness (pain limited on R, no SLR, R hurts stabilizing to move L LE)       Communication   Communication: No difficulties  Cognition Arousal/Alertness: Awake/alert Behavior During Therapy: WFL for tasks assessed/performed Overall Cognitive Status: Within Functional Limits for tasks assessed                                        General Comments      Exercises     Assessment/Plan    PT Assessment Patient needs continued PT services  PT Problem List Decreased strength;Decreased range of motion;Decreased activity tolerance;Decreased balance;Decreased mobility;Decreased knowledge of use of DME;Decreased safety awareness;Pain       PT Treatment Interventions DME instruction;Gait training;Stair training;Functional mobility training;Therapeutic activities;Therapeutic exercise;Balance training;Neuromuscular re-education;Patient/family education    PT Goals (Current goals can be found in the Care Plan section)  Acute Rehab PT Goals Patient Stated Goal: control pain PT Goal Formulation: With patient Time For Goal Achievement: 02/25/21 Potential to Achieve Goals: Fair    Frequency Min 2X/week   Barriers to discharge        Co-evaluation               AM-PAC PT "6 Clicks" Mobility  Outcome Measure Help needed turning from your back to your side while in a flat bed without using bedrails?: A Lot Help needed moving from lying on your back to sitting on the side of a flat bed without using bedrails?: A Lot Help needed moving to and from a bed to a chair (including a wheelchair)?: A Lot Help needed standing up from a chair using your arms (e.g., wheelchair or bedside chair)?: A Lot Help needed to walk in hospital room?: Total Help  needed climbing 3-5 steps with a railing? : Total 6 Click Score: 10    End of Session Equipment Utilized During Treatment: Gait belt Activity Tolerance: Patient tolerated treatment well;Patient limited by pain Patient left: in bed;with call bell/phone within reach;with family/visitor present Nurse Communication: Mobility status PT Visit Diagnosis: Muscle weakness (generalized) (M62.81);Difficulty in walking, not elsewhere classified (R26.2);Pain;Unsteadiness on feet (R26.81) Pain - Right/Left: Right Pain - part of body: Hip    Time: 0315-9458 PT Time Calculation (min) (ACUTE ONLY): 45 min   Charges:   PT Evaluation $PT Eval Low Complexity: 1 Low PT Treatments $Therapeutic Activity: 8-22 mins        Kreg Shropshire, DPT 02/11/2021, 6:13 PM

## 2021-02-11 NOTE — Consult Note (Signed)
ORTHOPAEDIC CONSULTATION  REQUESTING PHYSICIAN: Max Sane, MD  Chief Complaint: Right hip pain  HPI: Matthew [name] is a 87 y.o. male who complains of right hip pain after mechanical fall.  X-rays in the ER showed presence of right-sided superior and inferior pubic rami fracture.  Past medical history notable for DM type II, ESRD on hemodialysis (Tuesday/Thursday/Saturday), CAD s/p CABG, hypertension, hypothyroidism, history of CHF.  Admitted after recent injury with right proximal humerus fracture (8/4).  Past Medical History:  Diagnosis Date   Anemia    Arthritis    Broken arm 05/2013   Left   CHF (congestive heart failure) (HCC)    Chronic kidney disease    Coronary artery disease    Diabetes mellitus without complication (HCC)    Dyspnea    DOE   Edema    FEET/LEGS OCCAS   Hypertension    Myocardial infarction ([place])    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   left frontal scalp, [facility]   Squamous cell carcinoma of skin 05/05/2020   left temporal scalp, in situ, [facility] 05/13/20   Past Surgical History:  Procedure Laterality Date   CATARACT EXTRACTION W/PHACO Left 07/26/2017   Procedure: CATARACT EXTRACTION PHACO AND INTRAOCULAR LENS PLACEMENT ([place]);  Surgeon: Birder Robson, MD;  Location: [facility] ORS;  Service: Ophthalmology;  Laterality: Left;  [place]   00:45.8 AP%  13.1 CDE  6.00 Fluid Pack Lot # [id number]   CATARACT EXTRACTION W/PHACO Right 08/17/2017   Procedure: CATARACT EXTRACTION PHACO AND INTRAOCULAR LENS PLACEMENT (IOC);  Surgeon: Birder Robson, MD;  Location: [facility] ORS;  Service: Ophthalmology;  Laterality: Right;  [place]  01:30 AP% 16.8 CDE 15.24 Fluid pack lot # [id number]   CHOLECYSTECTOMY     CORONARY ANGIOPLASTY     STENTS   CORONARY ARTERY BYPASS GRAFT     DIALYSIS/PERMA CATHETER INSERTION N/A 02/09/2018   Procedure: DIALYSIS/PERMA CATHETER INSERTION;  Surgeon: Algernon Huxley, MD;  Location: [place] CV LAB;  Service: Cardiovascular;  Laterality: N/A;    FRACTURE SURGERY     ARM   INSERTION OF DIALYSIS CATHETER Right 11/03/2020   Procedure: INSERTION OF Perm Cath in the RIGHT INTERNAL JUGULAR;  Surgeon: Algernon Huxley, MD;  Location: [facility] ORS;  Service: Vascular;  Laterality: Right;   LEFT HEART CATHETERIZATION WITH CORONARY/GRAFT ANGIOGRAM Left 12/04/2013   Procedure: LEFT HEART CATHETERIZATION WITH Beatrix Fetters;  Surgeon: Leonie Man, MD;  Location: [facility] CATH LAB;  Service: Cardiovascular;  Laterality: Left;   PERCUTANEOUS CORONARY STENT INTERVENTION (PCI-S) N/A 12/06/2013   Procedure: PERCUTANEOUS CORONARY STENT INTERVENTION (PCI-S);  Surgeon: Sinclair Grooms, MD;  Location: [facility] CATH LAB;  Service: Cardiovascular;  Laterality: N/A;   REMOVAL OF A DIALYSIS CATHETER N/A 11/03/2020   Procedure: REMOVAL OF A DIALYSIS CATHETER;  Surgeon: Algernon Huxley, MD;  Location: [facility] ORS;  Service: Vascular;  Laterality: N/A;   VEIN BYPASS SURGERY     [place] Bypass Surgery    Social History   Socioeconomic History   Marital status: Married    Spouse name: Not on file   Number of children: Not on file   Years of education: Not on file   Highest education level: Not on file  Occupational History   Not on file  Tobacco Use   Smoking status: Former    Types: Cigarettes   Smokeless tobacco: Never   Tobacco comments:    quit 30 yrs ago, smoked 14-15 yrs, mainly pipe  Vaping Use   Vaping  Use: Never used  Substance and Sexual Activity   Alcohol use: Not Currently    Alcohol/week: 1.0 standard drink    Types: 1 Cans of beer per week    Comment: occasionally drinking only,once a month   Drug use: No   Sexual activity: Never  Other Topics Concern   Not on file  Social History Narrative   Not on file   Social Determinants of Health   Financial Resource Strain: Not on file  Food Insecurity: Not on file  Transportation Needs: Not on file  Physical Activity: Not on file  Stress: Not on file  Social Connections: Not on file   Family  History  Problem Relation Age of Onset   Heart attack Father 22       died first MI at age 29   Heart failure Mother    Allergies  Allergen Reactions   Other     Other reaction(s): Myalgias (intolerance), Other (See Comments) Statin Drugs  Statin Drugs     Amoxicillin Diarrhea   Prior to Admission medications   Medication Sig Start Date End Date Taking? Authorizing Provider  acyclovir ointment (ZOVIRAX) 5 % Apply 1 application topically daily as needed (shingles).   Yes [provider]  amiodarone (PACERONE) 200 MG tablet Take 200 mg by mouth daily.   Yes [provider]  cholecalciferol (VITAMIN D) 25 MCG (1000 UNIT) tablet Take 1,000 Units by mouth daily with breakfast.   Yes [provider]  clopidogrel (PLAVIX) 75 MG tablet Take 1 tablet (75 mg total) by mouth daily with breakfast. 12/07/13  Yes Almyra Deforest, PA  ezetimibe (ZETIA) 10 MG tablet Take 10 mg by mouth daily before breakfast.   Yes [provider]  ferrous sulfate 325 (65 FE) MG tablet Take 325 mg by mouth 2 (two) times daily with a meal.    Yes [provider]  furosemide (LASIX) 40 MG tablet Take 40 mg by mouth.   Yes [provider]  gabapentin (NEURONTIN) 300 MG capsule Take 300 mg by mouth 3 (three) times daily.   Yes [provider]  glipiZIDE (GLUCOTROL) 5 MG tablet Take 2.5 mg by mouth daily at 12 noon. 01/13/21  Yes [provider]  levocetirizine (XYZAL) 5 MG tablet Take 10 mg by mouth every morning.    Yes [provider]  levothyroxine (SYNTHROID) 88 MCG tablet Take 88 mcg by mouth daily before breakfast.   Yes [provider]  metoprolol succinate (TOPROL-XL) 25 MG 24 hr tablet Take 0.5 tablets (12.5 mg total) by mouth at bedtime. 01/05/21  Yes Fritzi Mandes, MD  midodrine (PROAMATINE) 10 MG tablet Take 1 tablet (10 mg total) by mouth 4 (four) times daily. 01/05/21  Yes Fritzi Mandes, MD  multivitamin (RENA-VIT) TABS tablet Take 1  tablet by mouth daily.   Yes [provider]  Omega-3 Fatty Acids (FISH OIL) 1000 MG CAPS Take 4,000 mg by mouth daily with lunch.   Yes [provider]  potassium chloride SA (KLOR-CON) 20 MEQ tablet Take 20 mEq by mouth daily.   Yes [provider]  rosuvastatin (CRESTOR) 40 MG tablet Take 40 mg by mouth at bedtime. 04/21/19  Yes [provider]  valACYclovir (VALTREX) 1000 MG tablet Take 1,000 mg by mouth daily. 02/09/21  Yes [provider]  bisacodyl (DULCOLAX) 10 MG suppository Place 1 suppository (10 mg total) rectally daily as needed for moderate constipation. 01/05/21   Fritzi Mandes, MD  fluticasone [facility]) 50  MCG/ACT nasal spray Place 2 sprays into both nostrils daily as needed for allergies or rhinitis.    [provider]  HYDROcodone-acetaminophen (NORCO/VICODIN) 5-325 MG tablet Take 1 tablet by mouth every 6 (six) hours as needed for severe pain. Patient not taking: Reported on 02/11/2021 01/05/21   Fritzi Mandes, MD  insulin aspart (NOVOLOG) 100 UNIT/ML injection Inject 0-6 Units into the skin 3 (three) times daily with meals. Patient not taking: Reported on 02/11/2021 01/05/21   Fritzi Mandes, MD  nitroGLYCERIN (NITROSTAT) 0.4 MG SL tablet Place 0.4 mg under the tongue every 5 (five) minutes as needed for chest pain.    [provider]  polyethylene glycol (MIRALAX / GLYCOLAX) 17 g packet Take 17 g by mouth daily. 01/06/21   Fritzi Mandes, MD  senna-docusate (SENOKOT-S) 8.6-50 MG tablet Take 2 tablets by mouth 2 (two) times daily. 01/05/21   Fritzi Mandes, MD  traMADol (ULTRAM) 50 MG tablet Take 1 tablet (50 mg total) by mouth every 6 (six) hours as needed for moderate pain. Patient not taking: Reported on 02/11/2021 01/05/21   Fritzi Mandes, MD   DG Chest 1 View  Result Date: 02/11/2021 CLINICAL DATA:  87 year old male status post fall. EXAM: CHEST  1 VIEW COMPARISON:  Chest radiographs 11/03/2020 and earlier. FINDINGS: AP view at 0906  hours. New dual lumen dialysis type right chest catheter. Chronic cardiomegaly and tortuosity of the thoracic aorta. Prior CABG. Stable cardiac size and mediastinal contours. Visualized tracheal air column is within normal limits. No pneumothorax, pulmonary edema. The right lung remains clear. Increased hypo ventilation at the left lung base. Small left pleural effusion difficult to exclude. No consolidation. No acute osseous abnormality identified. IMPRESSION: 1. Difficult to exclude small left pleural effusion. But otherwise no acute cardiopulmonary abnormality or acute traumatic injury identified. 2. Chronic cardiomegaly, tortuous aorta. Electronically Signed   By: Genevie Ann M.D.   On: 02/11/2021 09:55   DG HIP UNILAT WITH PELVIS 2-3 VIEWS LEFT  Result Date: 02/11/2021 CLINICAL DATA:  Left hip pain after fall. EXAM: DG HIP (WITH OR WITHOUT PELVIS) 2-3V LEFT COMPARISON:  None. FINDINGS: There is no evidence of hip fracture or dislocation. No significant joint space narrowing is noted. Mild osteophyte formation is noted. 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 No acute abnormality is noted. Electronically Signed   By: Marijo Conception M.D.   On: 02/11/2021 09:57   DG Hip Unilat  With Pelvis 2-3 Views Right  Result Date: 02/11/2021 CLINICAL DATA:  Status post fall. EXAM: DG HIP (WITH OR WITHOUT PELVIS) 2-3V RIGHT COMPARISON:  None FINDINGS: The bones appear diffusely osteopenic. There is no evidence of hip fracture or dislocation. There are acute right superior and inferior pubic rami fractures. Mild degenerative changes are noted involving the right hip. IMPRESSION: Acute right superior and inferior pubic rami fractures. Electronically Signed   By: Kerby Moors M.D.   On: 02/11/2021 09:56    Positive ROS: All other systems have been reviewed and were otherwise negative with the exception of those mentioned in the HPI and as above.  Physical Exam: General: Alert, no acute  distress Cardiovascular: No pedal edema  MUSCULOSKELETAL:  Right upper extremity: Mild tenderness about the right shoulder, range of motion deferred Right lower extremity: No tenderness about the right hip, range of motion of the right hip deferred Right upper and lower extremities are neurovascular intact  Assessment: 86 year old male with multiple medical comorbidities admitted after recent fall with minimally displaced  superior and inferior pubic rami fractures.  Also status post recent right proximal humerus fracture (8/4).  We will continue plan for nonoperative treatment of both fractures.  Plan: -Appreciate hospitalist team support -PT/OT: Weight-bear as tolerated for right lower extremity. Okay for use of walker with right upper extremity -Patient can follow-up in clinic for reevaluation in 2 to 3 weeks   Renee Harder, MD    02/11/2021 5:42 PM

## 2021-02-11 NOTE — ED Notes (Signed)
Pharmacy called- requested medication review per POA request.

## 2021-02-11 NOTE — Progress Notes (Signed)
OT Cancellation Note  Patient Details Name: Matthew [name] MRN: [id number] DOB: 03/11/1931   Cancelled Treatment:    Reason Eval/Treat Not Completed: Patient at procedure or test/ unavailable. Thank you for the OT consult. Order received and chart reviewed. Upon arrival to pt room, pt with hospital staff for extended period of time. Unavailable for OT evaluation. Will re-attempt at a later date/time as available and pt medically appropriate for OT services.   Shara Blazing, M.S., OTR/L Ascom: [phone] 02/11/21, 2:52 PM

## 2021-02-12 ENCOUNTER — Inpatient Hospital Stay: Payer: Medicare Other

## 2021-02-12 DIAGNOSIS — Z955 Presence of coronary angioplasty implant and graft: Secondary | ICD-10-CM | POA: Diagnosis not present

## 2021-02-12 DIAGNOSIS — W19XXXA Unspecified fall, initial encounter: Secondary | ICD-10-CM | POA: Diagnosis not present

## 2021-02-12 DIAGNOSIS — S3282XA Multiple fractures of pelvis without disruption of pelvic ring, initial encounter for closed fracture: Secondary | ICD-10-CM | POA: Diagnosis not present

## 2021-02-12 DIAGNOSIS — E039 Hypothyroidism, unspecified: Secondary | ICD-10-CM | POA: Diagnosis present

## 2021-02-12 DIAGNOSIS — Z87891 Personal history of nicotine dependence: Secondary | ICD-10-CM | POA: Diagnosis not present

## 2021-02-12 DIAGNOSIS — Z79899 Other long term (current) drug therapy: Secondary | ICD-10-CM | POA: Diagnosis not present

## 2021-02-12 DIAGNOSIS — Z20822 Contact with and (suspected) exposure to covid-19: Secondary | ICD-10-CM | POA: Diagnosis present

## 2021-02-12 DIAGNOSIS — Z85828 Personal history of other malignant neoplasm of skin: Secondary | ICD-10-CM | POA: Diagnosis not present

## 2021-02-12 DIAGNOSIS — S32591A Other specified fracture of right pubis, initial encounter for closed fracture: Secondary | ICD-10-CM | POA: Diagnosis present

## 2021-02-12 DIAGNOSIS — I132 Hypertensive heart and chronic kidney disease with heart failure and with stage 5 chronic kidney disease, or end stage renal disease: Secondary | ICD-10-CM | POA: Diagnosis present

## 2021-02-12 DIAGNOSIS — N186 End stage renal disease: Secondary | ICD-10-CM | POA: Diagnosis present

## 2021-02-12 DIAGNOSIS — N2581 Secondary hyperparathyroidism of renal origin: Secondary | ICD-10-CM | POA: Diagnosis present

## 2021-02-12 DIAGNOSIS — B0229 Other postherpetic nervous system involvement: Secondary | ICD-10-CM | POA: Diagnosis present

## 2021-02-12 DIAGNOSIS — I2510 Atherosclerotic heart disease of native coronary artery without angina pectoris: Secondary | ICD-10-CM | POA: Diagnosis present

## 2021-02-12 DIAGNOSIS — Z880 Allergy status to penicillin: Secondary | ICD-10-CM | POA: Diagnosis not present

## 2021-02-12 DIAGNOSIS — L8942 Pressure ulcer of contiguous site of back, buttock and hip, stage 2: Secondary | ICD-10-CM | POA: Diagnosis not present

## 2021-02-12 DIAGNOSIS — S329XXA Fracture of unspecified parts of lumbosacral spine and pelvis, initial encounter for closed fracture: Secondary | ICD-10-CM | POA: Diagnosis present

## 2021-02-12 DIAGNOSIS — S32511A Fracture of superior rim of right pubis, initial encounter for closed fracture: Secondary | ICD-10-CM | POA: Diagnosis not present

## 2021-02-12 DIAGNOSIS — I252 Old myocardial infarction: Secondary | ICD-10-CM | POA: Diagnosis not present

## 2021-02-12 DIAGNOSIS — Z888 Allergy status to other drugs, medicaments and biological substances status: Secondary | ICD-10-CM | POA: Diagnosis not present

## 2021-02-12 DIAGNOSIS — Z951 Presence of aortocoronary bypass graft: Secondary | ICD-10-CM | POA: Diagnosis not present

## 2021-02-12 DIAGNOSIS — D631 Anemia in chronic kidney disease: Secondary | ICD-10-CM | POA: Diagnosis present

## 2021-02-12 DIAGNOSIS — I5042 Chronic combined systolic (congestive) and diastolic (congestive) heart failure: Secondary | ICD-10-CM | POA: Diagnosis present

## 2021-02-12 DIAGNOSIS — Z8249 Family history of ischemic heart disease and other diseases of the circulatory system: Secondary | ICD-10-CM | POA: Diagnosis not present

## 2021-02-12 DIAGNOSIS — I951 Orthostatic hypotension: Secondary | ICD-10-CM | POA: Diagnosis present

## 2021-02-12 DIAGNOSIS — L89312 Pressure ulcer of right buttock, stage 2: Secondary | ICD-10-CM | POA: Diagnosis present

## 2021-02-12 DIAGNOSIS — Z66 Do not resuscitate: Secondary | ICD-10-CM | POA: Diagnosis present

## 2021-02-12 DIAGNOSIS — W010XXA Fall on same level from slipping, tripping and stumbling without subsequent striking against object, initial encounter: Secondary | ICD-10-CM | POA: Diagnosis present

## 2021-02-12 DIAGNOSIS — Z992 Dependence on renal dialysis: Secondary | ICD-10-CM | POA: Diagnosis not present

## 2021-02-12 DIAGNOSIS — E1122 Type 2 diabetes mellitus with diabetic chronic kidney disease: Secondary | ICD-10-CM | POA: Diagnosis present

## 2021-02-12 LAB — GLUCOSE, CAPILLARY
Glucose-Capillary: 97 mg/dL (ref 70–99)
Glucose-Capillary: 171 mg/dL — ABNORMAL HIGH (ref 70–99)
Glucose-Capillary: 108 mg/dL — ABNORMAL HIGH (ref 70–99)
Glucose-Capillary: 107 mg/dL — ABNORMAL HIGH (ref 70–99)

## 2021-02-12 LAB — BASIC METABOLIC PANEL
Anion gap: 8 (ref 5–15)
BUN: 42 mg/dL — ABNORMAL HIGH (ref 8–23)
CO2: 27 mmol/L (ref 22–32)
Calcium: 8.7 mg/dL — ABNORMAL LOW (ref 8.9–10.3)
Chloride: 101 mmol/L (ref 98–111)
Creatinine, Ser: 3.35 mg/dL — ABNORMAL HIGH (ref 0.61–1.24)
GFR, Estimated: 17 mL/min — ABNORMAL LOW (ref >60)
Glucose, Bld: 120 mg/dL — ABNORMAL HIGH (ref 70–99)
Potassium: 4.7 mmol/L (ref 3.5–5.1)
Sodium: 136 mmol/L (ref 135–145)

## 2021-02-12 LAB — CBC
HCT: 28.1 % — ABNORMAL LOW (ref 39.0–52.0)
Hemoglobin: 9.2 g/dL — ABNORMAL LOW (ref 13.0–17.0)
MCH: 31.4 pg (ref 26.0–34.0)
MCHC: 32.7 g/dL (ref 30.0–36.0)
MCV: 95.9 fL (ref 80.0–100.0)
Platelets: 136 10*3/uL — ABNORMAL LOW (ref 150–400)
RBC: 2.93 MIL/uL — ABNORMAL LOW (ref 4.22–5.81)
RDW: 14.6 % (ref 11.5–15.5)
WBC: 7.8 10*3/uL (ref 4.0–10.5)
nRBC: 0 % (ref 0.0–0.2)

## 2021-02-12 LAB — MRSA NEXT GEN BY PCR, NASAL: MRSA by PCR Next Gen: NOT DETECTED

## 2021-02-12 LAB — HEMOGLOBIN A1C
Hgb A1c MFr Bld: 6.2 % — ABNORMAL HIGH (ref 4.8–5.6)
Mean Plasma Glucose: 131 mg/dL

## 2021-02-12 LAB — HEPATITIS B SURFACE ANTIBODY,QUALITATIVE: Hep B S Ab: REACTIVE — AB

## 2021-02-12 LAB — HEPATITIS B SURFACE ANTIGEN: Hepatitis B Surface Ag: NONREACTIVE

## 2021-02-12 MED ORDER — HEPARIN SODIUM (PORCINE) 1000 UNIT/ML IJ SOLN
INTRAMUSCULAR | Status: AC
  Filled 2021-02-12: qty 1

## 2021-02-12 NOTE — NC FL2 (Signed)
[place] LEVEL OF CARE SCREENING TOOL     IDENTIFICATION  Patient Name: Matthew [name] Birthdate: 1931/03/31 Sex: male Admission Date (Current Location): 02/11/2021  [facility] and [place] Number:  Engineering geologist and Address:         Provider Number: [id number]  Attending Physician Name and Address:  Richarda Osmond, MD  Relative Name and Phone Number:       Current Level of Care: Hospital Recommended Level of Care: [place] Prior Approval Number:    Date Approved/Denied:   PASRR Number: [id number]  Discharge Plan: SNF    Current Diagnoses: Patient Active Problem List   Diagnosis Date Noted   Pelvic fracture ([place]) 02/11/2021   AF (paroxysmal atrial fibrillation) ([place])    ESRD (end stage renal disease) ([place])    Hypotension 12/26/2020   Tremor    Hypothyroidism    Chronic systolic CHF (congestive heart failure) ([place])    Fall 12/25/2020   Proximal humerus fracture 12/25/2020   Malposition of peritoneal dialysis catheter ([place]) 11/01/2020   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   Sepsis ([place]) 02/15/2018   Fluid overload 01/10/2018   Hypoglycemia 12/09/2017   Chronic kidney disease 07/18/2015   GERD (gastroesophageal reflux disease) 07/18/2015   CAD (coronary artery disease) 12/04/2013   Unstable angina; Class III Angina 12/03/2013   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 ([place]) 12/03/2013   HLD (hyperlipidemia) 12/03/2013   Heart disease 12/03/2013    Orientation RESPIRATION BLADDER Height & Weight     Self, Time, Situation, Place  Normal Continent Weight: 98.6 kg Height:  6' (182.9 cm)  BEHAVIORAL SYMPTOMS/MOOD NEUROLOGICAL BOWEL NUTRITION STATUS      Continent Diet (Heart healthy carb modified)  AMBULATORY STATUS COMMUNICATION OF NEEDS Skin   Total Care (mod assist for transfers) Verbally Skin abrasions                       Personal Care Assistance Level of Assistance               Functional Limitations Info             SPECIAL CARE FACTORS FREQUENCY  PT (By licensed PT), OT (By licensed OT)                    Contractures Contractures Info: Not present    Additional Factors Info  Code Status, Allergies Code Status Info: DNR Allergies Info: Other reaction(s): Myalgias (intolerance), Other (See Comments) Statin Drugs Statin Drugs, amoxicillin           Current Medications (02/12/2021):  This is the current hospital active medication list Current Facility-Administered Medications  Medication Dose Route Frequency Provider Last Rate Last Admin   0.9 %  sodium chloride infusion  100 mL Intravenous PRN Breeze, Shantelle, NP       0.9 %  sodium chloride infusion  100 mL Intravenous PRN Colon Flattery, NP       acyclovir ointment (ZOVIRAX) 5 % 1 application  1 application Topical Daily PRN Manuella Ghazi, Vipul, MD       alteplase (CATHFLO ACTIVASE) injection 2 mg  2 mg Intracatheter Once PRN Colon Flattery, NP       amiodarone (PACERONE) tablet 200 mg  200 mg Oral Daily Manuella Ghazi, Vipul, MD   200 mg at 02/12/21 0936   bisacodyl (DULCOLAX) suppository 10 mg  10 mg Rectal Daily PRN Max Sane, MD  Chlorhexidine Gluconate Cloth 2 % PADS 6 each  6 each Topical Q0600 Colon Flattery, NP   6 each at 02/12/21 0553   cholecalciferol (VITAMIN D) tablet 1,000 Units  1,000 Units Oral Q breakfast Max Sane, MD   1,000 Units at 02/12/21 1610   clopidogrel (PLAVIX) tablet 75 mg  75 mg Oral Q breakfast Max Sane, MD   75 mg at 02/12/21 9604   ezetimibe (ZETIA) tablet 10 mg  10 mg Oral QAC breakfast Max Sane, MD   10 mg at 02/12/21 5409   ferrous sulfate tablet 325 mg  325 mg Oral BID WC Max Sane, MD   325 mg at 02/12/21 0819   fluticasone (FLONASE) 50 MCG/ACT nasal spray 2 spray  2 spray Each Nare Daily PRN Max Sane, MD       gabapentin (NEURONTIN) capsule 300 mg  300 mg Oral TID Max Sane, MD   300 mg at 02/12/21 0936   heparin injection 1,000 Units   1,000 Units Dialysis PRN Colon Flattery, NP       heparin injection 5,000 Units  5,000 Units Subcutaneous Q8H Max Sane, MD   5,000 Units at 02/12/21 1338   HYDROcodone-acetaminophen (NORCO/VICODIN) 5-325 MG per tablet 1 tablet  1 tablet Oral Q6H PRN Max Sane, MD   1 tablet at 02/12/21 0936   insulin aspart (novoLOG) injection 0-5 Units  0-5 Units Subcutaneous QHS Max Sane, MD       insulin aspart (novoLOG) injection 0-9 Units  0-9 Units Subcutaneous TID WC Max Sane, MD   2 Units at 02/12/21 1242   levothyroxine (SYNTHROID) tablet 75 mcg  75 mcg Oral QAC breakfast Max Sane, MD   75 mcg at 02/12/21 0537   lidocaine (PF) (XYLOCAINE) 1 % injection 5 mL  5 mL Intradermal PRN Colon Flattery, NP       lidocaine-prilocaine (EMLA) cream 1 application  1 application Topical PRN Colon Flattery, NP       loratadine (CLARITIN) tablet 10 mg  10 mg Oral Daily Max Sane, MD   10 mg at 02/12/21 0936   midodrine (PROAMATINE) tablet 10 mg  10 mg Oral QID Max Sane, MD   10 mg at 02/12/21 1241   multivitamin (RENA-VIT) tablet 1 tablet  1 tablet Oral Daily Max Sane, MD   1 tablet at 02/12/21 0936   nitroGLYCERIN (NITROSTAT) SL tablet 0.4 mg  0.4 mg Sublingual Q5 min PRN Max Sane, MD       pentafluoroprop-tetrafluoroeth (GEBAUERS) aerosol 1 application  1 application Topical PRN Colon Flattery, NP       polyethylene glycol (MIRALAX / GLYCOLAX) packet 17 g  17 g Oral Daily Manuella Ghazi, Vipul, MD   17 g at 02/12/21 0935   rosuvastatin (CRESTOR) tablet 40 mg  40 mg Oral QHS Max Sane, MD   40 mg at 02/11/21 2248   senna-docusate (Senokot-S) tablet 2 tablet  2 tablet Oral BID Max Sane, MD   2 tablet at 02/12/21 0936   traMADol (ULTRAM) tablet 50 mg  50 mg Oral Q6H PRN Max Sane, MD   50 mg at 02/12/21 1418     Discharge Medications: Please see discharge summary for a list of discharge medications.  Relevant Imaging Results:  Relevant Lab Results:   Additional Information SS#:  [id number]. HD TTS [employer]. Per daughter fully vaccinated (05/30/2019, 06/20/2019) with 3boosters (unknown dates). Inpatient on 9/22  Beverly Sessions, RN

## 2021-02-12 NOTE — Progress Notes (Signed)
Patient tolerated treatment without incident. Catheter functioned well, no s/sx of infection, drsg changed. UF goal met 1.5 liters removed. VSS, afebrile 97.8. Pt returned to room.

## 2021-02-12 NOTE — Progress Notes (Signed)
Physical Therapy Treatment Patient Details Name: Matthew [name] MRN: [id number] DOB: 12-23-1930 Today's Date: 02/12/2021   History of Present Illness Matthew [name] is a 87 y.o. male who was here in early August with fall and R humeral fracture, went to rehab and now here with another fall and pelvic fracture.  Medical history significant for diabetes mellitus with complications of end-stage renal disease on hemodialysis (dialysis days are Tuesday/Thursday/Saturday), coronary artery disease status post CABG, hypertension.    PT Comments    Pt eager to work with PT despite pain and general fatigue. He showed great effort with getting to/from supine and during standing/weight shift acts.  He was able to stand ~5 minutes with laborious attempts to side steps long EOB.  Did manage to clear L foot a few times, but clearly very uncomfortable/pain limited.   Recommendations for follow up therapy are one component of a multi-disciplinary discharge planning process, led by the attending physician.  Recommendations may be updated based on patient status, additional functional criteria and insurance authorization.  Follow Up Recommendations  SNF     Equipment Recommendations   (TBD at next venue)    Recommendations for Other Services       Precautions / Restrictions Precautions Precautions: Fall Precaution Comments: per ortho: can start lightly using R UE as tolerated Restrictions Weight Bearing Restrictions: Yes RUE Weight Bearing: Weight bearing as tolerated RLE Weight Bearing: Weight bearing as tolerated Other Position/Activity Restrictions: per ortho can use R UE as tolerated     Mobility  Bed Mobility Overal bed mobility: Needs Assistance Bed Mobility: Supine to Sit;Sit to Supine     Supine to sit: Mod assist;Min assist Sit to supine: Mod assist   General bed mobility comments: good effort, improved from yesterday but still very guarded and labored    Transfers Overall  transfer level: Needs assistance Equipment used: Rolling walker (2 wheeled) Transfers: Sit to/from Stand Sit to Stand: From elevated surface;Mod assist         General transfer comment: Elevated bed ~2", heavy cuing and set up.  Difficult to make initial upward effort but with much effort attained standing  Ambulation/Gait             General Gait Details: again unable to ambulate, but did manage to maintain standing ~5 minutes with a few side heel-toe shuffles, did manage to take enough weight through walker and R LE to allow L foot to clear the ground on 3 seperate side step efforts   Stairs             Wheelchair Mobility    Modified Rankin (Stroke Patients Only)       Balance Overall balance assessment: Needs assistance Sitting-balance support: Single extremity supported Sitting balance-Leahy Scale: Good     Standing balance support: Bilateral upper extremity supported Standing balance-Leahy Scale: Fair Standing balance comment: Pt did maintain EOB standing with walker for ~5 minutes.  Did not require constant assist, but much of the time at least min assist to maintain upright                            Cognition Arousal/Alertness: Awake/alert Behavior During Therapy: WFL for tasks assessed/performed Overall Cognitive Status: Within Functional Limits for tasks assessed                                 General  Comments: appropriate and pleasant throughout      Exercises Other Exercises Other Exercises: OT ed with pt and his daughter re: role of OT in acute setting. Importance of OOB activity. Risks/benefirts. Pt and his dtr with good understanding.    General Comments        Pertinent Vitals/Pain Pain Assessment: 0-10 Pain Score: 7  Pain Location: back, lateral pelvis, groin Pain Descriptors / Indicators: Guarding;Grimacing Pain Intervention(s): Limited activity within patient's tolerance;Monitored during  session;Repositioned;Patient requesting pain meds-RN notified    Home Living Family/patient expects to be discharged to:: Private residence Living Arrangements: Children Available Help at Discharge: Family Type of Home: House Home Access: Stairs to enter Entrance Stairs-Rails: Can reach both Home Layout: One level Home Equipment: Cane - single point      Prior Function Level of Independence: Independent with assistive device(s)      Comments: prior to fall 7 WA, pt was using cane for fxl mobility, walked every day, and was INDEP for ADLs. Has since been requiring increased assistance d/t significant pain as well as conservative mgt limitations for R UE WB (now lifted)   PT Goals (current goals can now be found in the care plan section) Acute Rehab PT Goals Patient Stated Goal: control pain Progress towards PT goals: Progressing toward goals    Frequency    Min 2X/week      PT Plan Current plan remains appropriate    Co-evaluation              AM-PAC PT "6 Clicks" Mobility   Outcome Measure  Help needed turning from your back to your side while in a flat bed without using bedrails?: A Lot Help needed moving from lying on your back to sitting on the side of a flat bed without using bedrails?: A Lot Help needed moving to and from a bed to a chair (including a wheelchair)?: A Lot Help needed standing up from a chair using your arms (e.g., wheelchair or bedside chair)?: A Lot Help needed to walk in hospital room?: Total Help needed climbing 3-5 steps with a railing? : Total 6 Click Score: 10    End of Session Equipment Utilized During Treatment: Gait belt Activity Tolerance: Patient tolerated treatment well;Patient limited by pain Patient left: in bed;with call bell/phone within reach;with family/visitor present Nurse Communication: Mobility status PT Visit Diagnosis: Muscle weakness (generalized) (M62.81);Difficulty in walking, not elsewhere classified  (R26.2);Pain;Unsteadiness on feet (R26.81) Pain - Right/Left: Right Pain - part of body: Hip     Time: 9826-4158 PT Time Calculation (min) (ACUTE ONLY): 17 min  Charges:  $Therapeutic Activity: 8-22 mins                     Kreg Shropshire, DPT 02/12/2021, 6:46 PM

## 2021-02-12 NOTE — TOC Initial Note (Signed)
Transition of Care [facility]) - Initial/Assessment Note    Patient Details  Name: Matthew [name] MRN: [id number] Date of Birth: 03/16/1931  Transition of Care [facility]) CM/SW Contact:    Beverly Sessions, RN Phone Number: 02/12/2021, 3:29 PM  Clinical Narrative:                 Patient admitted with Pubic rami fracture  Discussed disposition with daughter Horris Latino Patient lives at home with daughter PCP Elijio Miles  Daughter was providing transportation to appointments prior to fracture  Patient was discharged from [place] [facility] on 9/16  Due to fracture and limited mobility PT recommending SNF.  Daughter in agreement. She does not want patient to return to [place].   At this time wishes to pursue SNF in [facility] so patient can remain at his home HD center  Elvera Bicker dialysis liaison notified of admission.    There is potential they may want to pursue SNF in the [facility], if so she will notify TOC  of their facilities they would like to pursue.   Existing PASRR Fl2 sent for signature Bed search initiated     Expected Discharge Plan: Skilled Nursing Facility Barriers to Discharge: Continued Medical Work up   Patient Goals and CMS Choice        Expected Discharge Plan and Services Expected Discharge Plan: [place]                                              Prior Living Arrangements/Services   Lives with:: Adult Children Patient language and need for interpreter reviewed:: Yes Do you feel safe going back to the place where you live?: Yes      Need for Family Participation in Patient Care: Yes (Comment) Care giver support system in place?: Yes (comment) Current home services: DME Criminal Activity/Legal Involvement Pertinent to Current Situation/Hospitalization: No - Comment as needed  Activities of Daily Living Home Assistive Devices/Equipment: CBG Meter, Blood pressure cuff, Cane (specify quad or straight), Scales ADL  Screening (condition at time of admission) Patient's cognitive ability adequate to safely complete daily activities?: Yes Is the patient deaf or have difficulty hearing?: No Does the patient have difficulty seeing, even when wearing glasses/contacts?: No Does the patient have difficulty concentrating, remembering, or making decisions?: No Patient able to express need for assistance with ADLs?: Yes Does the patient have difficulty dressing or bathing?: Yes Independently performs ADLs?: Yes (appropriate for developmental age) Does the patient have difficulty walking or climbing stairs?: Yes Weakness of Legs: Both Weakness of Arms/Hands: Right (Fracture)  Permission Sought/Granted                  Emotional Assessment       Orientation: : Oriented to Self, Oriented to Place, Oriented to  Time, Oriented to Situation Alcohol / Substance Use: Not Applicable Psych Involvement: No (comment)  Admission diagnosis:  Pelvic fracture ([place]) [S32.9XXA] Fall [W19.XXXA] Multiple closed fractures of pelvis without disruption of pelvic ring, initial encounter [facility]) [S32.82XA] Patient Active Problem List   Diagnosis Date Noted   Pelvic fracture ([place]) 02/11/2021   AF (paroxysmal atrial fibrillation) (HCC)    ESRD (end stage renal disease) ([place])    Hypotension 12/26/2020   Tremor    Hypothyroidism    Chronic systolic CHF (congestive heart failure) ([place])    Fall 12/25/2020  Proximal humerus fracture 12/25/2020   Malposition of peritoneal dialysis catheter ([place]) 11/01/2020   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   Sepsis ([place]) 02/15/2018   Fluid overload 01/10/2018   Hypoglycemia 12/09/2017   Chronic kidney disease 07/18/2015   GERD (gastroesophageal reflux disease) 07/18/2015   CAD (coronary artery disease) 12/04/2013   Unstable angina; Class III Angina 12/03/2013   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 [place] [address] Phone: [phone] Fax: [phone]     Social Determinants of Health (SDOH) Interventions    Readmission Risk Interventions Readmission Risk Prevention Plan 02/12/2021  Transportation Screening Complete  Medication Review Press photographer) Complete  SW Recovery Care/Counseling Consult Complete  Skilled Nursing Facility Complete  Some recent data might be hidden

## 2021-02-12 NOTE — Progress Notes (Signed)
[facility]  ROUNDING NOTE   Subjective:   Matthew [name] is an 87 year old male with medical concerns including CAD status post CABG, hypothyroidism, hypertension, CHF, type 2 diabetes, and end-stage renal disease on hemodialysis.  Patient presents to the ED after falling on his right hip and complaining of right hip pain.  He has been admitted to [facility] for Pelvic fracture ([place]) [S32.9XXA] Fall [W19.XXXA] Multiple closed fractures of pelvis without disruption of pelvic ring, initial encounter [facility]) [S32.82XA]  Patient is known to our practice and receives outpatient treatments at [facility], supervised by Dr. Holley Raring.  He has maintained all outpatient dialysis treatments.  He was recently admitted for a fall that resulted in a right humeral fracture, right arm remains in a sling.  Patient seen resting in bed, daughter at bedside.  States patient was stepping off a scale and slipped.  Reports pain with movement but is able to move both legs freely.  Denies fever and chest pain.  ED exams include a right hip x-ray showing a superior-inferior pubic rami fracture.  Orthopedics consulted and recommended pain management.  PT, OT consults placed.  Probable rehab placement at discharge.  Patient also found to have initial stages of shingles.  Recently had shingles at the beginning of August, recovered and received the shingles vaccine.  Complains of tingling and burning around right chest to back area.  We will manage dialysis needs during hospitalization.   Objective:  Vital signs in last 24 hours:  Temp:  [97.9 F (36.6 C)-99.2 F (37.3 C)] 98.1 F (36.7 C) (09/22 1216) Pulse Rate:  [69-90] 69 (09/22 1216) Resp:  [15-22] 16 (09/22 1216) BP: (116-167)/(60-74) 135/64 (09/22 1216) SpO2:  [93 %-99 %] 98 % (09/22 1216) Weight:  [96.6 kg-98.6 kg] 98.6 kg (09/22 0607)  Weight change:  Filed Weights   02/11/21 0844 02/11/21 2150 02/12/21 0607  Weight: 94.8 kg 96.6 kg 98.6 kg     Intake/Output: I/O last 3 completed shifts: In: 240 [P.O.:240] Out: 525 [Urine:525]   Intake/Output this shift:  Total I/O In: -  Out: 250 [Urine:250]  Physical Exam: General: NAD, laying in bed  Head: Normocephalic, atraumatic. Moist oral mucosal membranes  Eyes: Anicteric  Lungs:  Clear to auscultation, normal effort  Heart: Regular rate and rhythm  Abdomen:  Soft, nontender, nondistended  Extremities: No peripheral edema.  Neurologic: Alert, moving all four extremities  Skin: Red, macular rash right breast to back  Access: Right PermCath    Basic Metabolic Panel: Recent Labs  Lab 02/11/21 0847 02/12/21 0325  NA 140 136  K 4.1 4.7  CL 100 101  CO2 31 27  GLUCOSE 109* 120*  BUN 36* 42*  CREATININE 2.96* 3.35*  CALCIUM 8.7* 8.7*    Liver Function Tests: Recent Labs  Lab 02/11/21 0847  AST 33  ALT 27  ALKPHOS 149*  BILITOT 1.0  PROT 6.3*  ALBUMIN 3.2*   No results for input(s): LIPASE, AMYLASE in the last 168 hours. No results for input(s): AMMONIA in the last 168 hours.  CBC: Recent Labs  Lab 02/11/21 0847 02/12/21 0325  WBC 9.0 7.8  NEUTROABS 6.6  --   HGB 9.8* 9.2*  HCT 29.6* 28.1*  MCV 96.1 95.9  PLT 153 136*    Cardiac Enzymes: No results for input(s): CKTOTAL, CKMB, CKMBINDEX, TROPONINI in the last 168 hours.  BNP: Invalid input(s): POCBNP  CBG: Recent Labs  Lab 02/11/21 1640 02/11/21 2053 02/11/21 2153 02/12/21 0817 02/12/21 1142  GLUCAP  208* 117* 121* 97 171*    Microbiology: Results for orders placed or performed during the hospital encounter of 02/11/21  Resp Panel by RT-PCR (Flu A&B, Covid) Nasopharyngeal Swab     Status: None   Collection Time: 02/11/21  9:45 AM   Specimen: Nasopharyngeal Swab; Nasopharyngeal(NP) swabs in vial transport medium  Result Value Ref Range Status   SARS Coronavirus 2 by RT PCR NEGATIVE NEGATIVE Final    Comment: (NOTE) SARS-CoV-2 target nucleic acids are NOT DETECTED.  The  SARS-CoV-2 RNA is generally detectable in upper respiratory specimens during the acute phase of infection. The lowest concentration of SARS-CoV-2 viral copies this assay can detect is 138 copies/mL. A negative result does not preclude SARS-Cov-2 infection and should not be used as the sole basis for treatment or other patient management decisions. A negative result may occur with  improper specimen collection/handling, submission of specimen other than nasopharyngeal swab, presence of viral mutation(s) within the areas targeted by this assay, and inadequate number of viral copies(<138 copies/mL). A negative result must be combined with clinical observations, patient history, and epidemiological information. The expected result is Negative.  Fact Sheet for Patients:  EntrepreneurPulse.com.au  Fact Sheet for Healthcare Providers:  IncredibleEmployment.be  This test is no t yet approved or cleared by the [place] FDA and  has been authorized for detection and/or diagnosis of SARS-CoV-2 by FDA under an Emergency Use Authorization (EUA). This EUA will remain  in effect (meaning this test can be used) for the duration of the COVID-19 declaration under Section 564(b)(1) of the Act, 21 U.S.C.section 360bbb-3(b)(1), unless the authorization is terminated  or revoked sooner.       Influenza A by PCR NEGATIVE NEGATIVE Final   Influenza B by PCR NEGATIVE NEGATIVE Final    Comment: (NOTE) The Xpert Xpress SARS-CoV-2/FLU/RSV plus assay is intended as an aid in the diagnosis of influenza from Nasopharyngeal swab specimens and should not be used as a sole basis for treatment. Nasal washings and aspirates are unacceptable for Xpert Xpress SARS-CoV-2/FLU/RSV testing.  Fact Sheet for Patients: EntrepreneurPulse.com.au  Fact Sheet for Healthcare Providers: IncredibleEmployment.be  This test is not yet approved or  cleared by the [place] FDA and has been authorized for detection and/or diagnosis of SARS-CoV-2 by FDA under an Emergency Use Authorization (EUA). This EUA will remain in effect (meaning this test can be used) for the duration of the COVID-19 declaration under Section 564(b)(1) of the Act, 21 U.S.C. section 360bbb-3(b)(1), unless the authorization is terminated or revoked.  Performed at [facility], [address] [place], [place] [address]   MRSA Next Gen by PCR, Nasal     Status: None   Collection Time: 02/11/21 10:58 PM   Specimen: Nasal Mucosa; Nasal Swab  Result Value Ref Range Status   MRSA by PCR Next Gen NOT DETECTED NOT DETECTED Final    Comment: (NOTE) The GeneXpert MRSA Assay (FDA approved for NASAL specimens only), is one component of a comprehensive MRSA colonization surveillance program. It is not intended to diagnose MRSA infection nor to guide or monitor treatment for MRSA infections. Test performance is not FDA approved in patients less than 77 years old. Performed at [facility], [address] [place], [place] [address]     Coagulation Studies: Recent Labs    02/11/21 0847  LABPROT 13.8  INR 1.1    Urinalysis: No results for input(s): COLORURINE, LABSPEC, PHURINE, GLUCOSEU, HGBUR, BILIRUBINUR, KETONESUR, PROTEINUR, UROBILINOGEN, NITRITE, LEUKOCYTESUR in the last  72 hours.  Invalid input(s): APPERANCEUR    Imaging: DG Chest 1 View  Result Date: 02/11/2021 CLINICAL DATA:  87 year old male status post fall. EXAM: CHEST  1 VIEW COMPARISON:  Chest radiographs 11/03/2020 and earlier. FINDINGS: AP view at 0906 hours. New dual lumen dialysis type right chest catheter. Chronic cardiomegaly and tortuosity of the thoracic aorta. Prior CABG. Stable cardiac size and mediastinal contours. Visualized tracheal air column is within normal limits. No pneumothorax, pulmonary edema. The right lung remains clear. Increased hypo ventilation at  the left lung base. Small left pleural effusion difficult to exclude. No consolidation. No acute osseous abnormality identified. IMPRESSION: 1. Difficult to exclude small left pleural effusion. But otherwise no acute cardiopulmonary abnormality or acute traumatic injury identified. 2. Chronic cardiomegaly, tortuous aorta. Electronically Signed   By: Genevie Ann M.D.   On: 02/11/2021 09:55   DG HIP UNILAT WITH PELVIS 2-3 VIEWS LEFT  Result Date: 02/11/2021 CLINICAL DATA:  Left hip pain after fall. EXAM: DG HIP (WITH OR WITHOUT PELVIS) 2-3V LEFT COMPARISON:  None. FINDINGS: There is no evidence of hip fracture or dislocation. No significant joint space narrowing is noted. Mild osteophyte formation is noted. 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 No acute abnormality is noted. Electronically Signed   By: Marijo Conception M.D.   On: 02/11/2021 09:57   DG Hip Unilat  With Pelvis 2-3 Views Right  Result Date: 02/11/2021 CLINICAL DATA:  Status post fall. EXAM: DG HIP (WITH OR WITHOUT PELVIS) 2-3V RIGHT COMPARISON:  None FINDINGS: The bones appear diffusely osteopenic. There is no evidence of hip fracture or dislocation. There are acute right superior and inferior pubic rami fractures. Mild degenerative changes are noted involving the right hip. IMPRESSION: Acute right superior and inferior pubic rami fractures. Electronically Signed   By: Kerby Moors M.D.   On: 02/11/2021 09:56     Medications:    sodium chloride     sodium chloride      amiodarone  200 mg Oral Daily   Chlorhexidine Gluconate Cloth  6 each Topical Q0600   cholecalciferol  1,000 Units Oral Q breakfast   clopidogrel  75 mg Oral Q breakfast   ezetimibe  10 mg Oral QAC breakfast   ferrous sulfate  325 mg Oral BID WC   gabapentin  300 mg Oral TID   heparin  5,000 Units Subcutaneous Q8H   insulin aspart  0-5 Units Subcutaneous QHS   insulin aspart  0-9 Units Subcutaneous TID WC   levothyroxine  75 mcg Oral QAC  breakfast   loratadine  10 mg Oral Daily   midodrine  10 mg Oral QID   multivitamin  1 tablet Oral Daily   polyethylene glycol  17 g Oral Daily   rosuvastatin  40 mg Oral QHS   senna-docusate  2 tablet Oral BID   sodium chloride, sodium chloride, acyclovir ointment, alteplase, bisacodyl, fluticasone, heparin, HYDROcodone-acetaminophen, lidocaine (PF), lidocaine-prilocaine, nitroGLYCERIN, pentafluoroprop-tetrafluoroeth, traMADol  Assessment/ Plan:  Matthew [name] is a 87 y.o.  male medical concerns including CAD status post CABG, hypothyroidism, hypertension, CHF, type 2 diabetes, and end-stage renal disease on hemodialysis.  Patient presents to the ED after falling on his right hip and complaining of right hip pain.  He has been admitted to [facility] for Pelvic fracture ([place]) [S32.9XXA] Fall [W19.XXXA] Multiple closed fractures of pelvis without disruption of pelvic ring, initial encounter (HCC) [S32.82XA]   [facility] [address] RIJ permcath 98.5kg  End  stage renal disease on dialysis.  Will maintain outpatient schedule if possible.  Last dialysis received 02/10/21.  Scheduled to receive dialysis this afternoon.  UF goal 1L as tolerated.  Next treatment scheduled for Saturday.  Anemia of chronic disease; hemoglobin 9.2, within acceptable range.  Secondary Hyperparathyroidism:  Lab Results  Component Value Date   CALCIUM 8.7 (L) 02/12/2021   PHOS 4.4 01/03/2021  Calcium slightly low,  will continue to monitor  Hypertension 135/64. Home regimen includes midodrine, metoprolol and furosemide. Midodrine continued, others held.  Right Pubic rami fracture-nonsurgical interventions per orthopedics.  Continue pain management, PT/OT prescribed.  Probable rehab at discharge.    LOS: 0   9/22/20222:26 PM

## 2021-02-12 NOTE — Evaluation (Signed)
Occupational Therapy Evaluation Patient Details Name: Matthew [name] MRN: [id number] DOB: 1931/01/26 Today's Date: 02/12/2021   History of Present Illness Matthew [name] is a 87 y.o. male who was here in early August with fall and R humeral fracture, went to rehab and now here with another fall and pelvic fracture.  Medical history significant for diabetes mellitus with complications of end-stage renal disease on hemodialysis (dialysis days are Tuesday/Thursday/Saturday), coronary artery disease status post CABG, hypertension.   Clinical Impression   Pt seen for OT Evaluation this date in setting of acute hospitalization d/t pelvic fx. Pt reports being INDEP at baseline (before initial fall 7 WA) including walking for exercise. Pt presents this date with decreased fxl activity tolerance, pain, weakness, and general deconditioning. On OT Assessment this date, pt requires: SETUP to SUPV for seated UB ADLs, MOD A for seated LB ADLs (can don socks at bed level with cross-leg technique). Requires MOD A from elevated durface for arm in arm transfers, unsuccessful coming to stand with RW. Will continue to follow acutely to improve pt's strength and tolerance. Anticipate he will require ongoing f/u OT services in STR upon d/c.      Recommendations for follow up therapy are one component of a multi-disciplinary discharge planning process, led by the attending physician.  Recommendations may be updated based on patient status, additional functional criteria and insurance authorization.   Follow Up Recommendations  SNF    Equipment Recommendations  3 in 1 bedside commode;Tub/shower seat;Other (comment) (2ww)    Recommendations for Other Services       Precautions / Restrictions Precautions Precautions: Fall Precaution Comments: pt presents still wearing sling from fall 7 WA. Ortho confirmed he can start lightly using R UE. F/u imaging ordered by attending to confirm WBAT Restrictions Weight Bearing  Restrictions: Yes RUE Weight Bearing: Weight bearing as tolerated (per ortho now >6 weeks out, okay to WBAT through UE) RLE Weight Bearing: Weight bearing as tolerated      Mobility Bed Mobility Overal bed mobility: Needs Assistance Bed Mobility: Supine to Sit;Sit to Supine     Supine to sit: Mod assist;HOB elevated Sit to supine: Mod assist;Max assist   General bed mobility comments: increased assist for LEs back to bed    Transfers Overall transfer level: Needs assistance Equipment used: 1 person hand held assist Transfers: Sit to/from Stand Sit to Stand: Mod assist;From elevated surface         General transfer comment: bed elevated ~5-6 inches, increased time, gait belt, arm in arm technique (2 trials, ~10-15 seconds each). Pt unsuccessful on CTS x2 trials with RW.    Balance Overall balance assessment: Needs assistance Sitting-balance support: Single extremity supported Sitting balance-Matthew [name]: Good     Standing balance support: Bilateral upper extremity supported Standing balance-Matthew [name]: Poor Standing balance comment: requires MOD A and at least unilateral support on stronger L UE side to sustain static balance. Initially hips flexed, requires increased time, cues and assist to extend to more erect static standing and cnanot tolerate more than ~5-10 seconds.                           ADL either performed or assessed with clinical judgement   ADL Overall ADL's : Needs assistance/impaired  General ADL Comments: requires SETUP to SUPV for seated UB ADLs, MOD A for seated LB ADLs (can don socks at bed level with cross-leg technique). Requires MOD A from elevated durface for arm in arm transfers, unsuccessful coming to stand with RW.     Vision Patient Visual Report: No change from baseline       Perception     Praxis      Pertinent Vitals/Pain Pain Assessment: 0-10 Pain Score: 8  Pain  Location: significant pain in back and pelvis with mobilization. Pain Descriptors / Indicators: Guarding;Grimacing Pain Intervention(s): Limited activity within patient's tolerance;Monitored during session;Repositioned;Patient requesting pain meds-RN notified     Hand Dominance Right   Extremity/Trunk Assessment Upper Extremity Assessment Upper Extremity Assessment: Generalized weakness;RUE deficits/detail;LUE deficits/detail RUE Deficits / Details: still utilizing sling upon OT presentation to room despite okay'ed by ortho to start WBAT. OT f/u with attending for imaging (per family request as pt was scheduled for f/u imaging as an outpt that he missed d/t this hospitalization). Limited shld flexion to ~1/4 range. Grip MMT grossly 3+/5 LUE Deficits / Details: ROM WFL, MMT Grossly 4-/5   Lower Extremity Assessment Lower Extremity Assessment: Generalized weakness (some increased limitation to R side than L esp with hip flexion/rotation as it pertains to LB ADLs, but overall functional ROM for mobilizing)       Communication Communication Communication: No difficulties   Cognition Arousal/Alertness: Awake/alert Behavior During Therapy: WFL for tasks assessed/performed Overall Cognitive Status: Within Functional Limits for tasks assessed                                 General Comments: appropriate and pleasant throughout   General Comments       Exercises Other Exercises Other Exercises: OT ed with pt and his daughter re: role of OT in acute setting. Importance of OOB activity. Risks/benefirts. Pt and his dtr with good understanding.   Shoulder Instructions      Home Living Family/patient expects to be discharged to:: Private residence Living Arrangements: Children Available Help at Discharge: Family Type of Home: House Home Access: Stairs to enter Technical brewer of Steps: 8 Entrance Stairs-Rails: Can reach both Home Layout: One [place]: [place] - single point          Prior Functioning/Environment Level of Independence: Independent with assistive device(s)        Comments: prior to fall 7 WA, pt was using cane for fxl mobility, walked every day, and was INDEP for ADLs. Has since been requiring increased assistance d/t significant pain as well as conservative mgt limitations for R UE WB (now lifted)        OT Problem List: Decreased strength;Decreased activity tolerance;Impaired balance (sitting and/or standing);Pain      OT Treatment/Interventions: Self-care/ADL training;Therapeutic exercise;DME and/or AE instruction;Therapeutic activities;Patient/family education;Balance training    OT Goals(Current goals can be found in the care plan section) Acute Rehab OT Goals Patient Stated Goal: control pain OT Goal Formulation: With patient Time For Goal Achievement: 02/26/21 Potential to Achieve Goals: Good ADL Goals Pt Will Perform Upper Body Dressing: with supervision;sitting (with modified hemi dressing technique as needed d/t R shoulder limitations) Pt Will Perform Lower Body Dressing: with min assist;with adaptive equipment;sit to/from stand (for clothing mgt over hips) Pt Will Transfer to Toilet: with min assist;stand pivot transfer;bedside commode Pt/caregiver  will Perform Home Exercise Program: Increased ROM;Right Upper extremity;Increased strength;Left upper extremity;With Supervision (midful of status of healing of R UE)  OT Frequency: Min 1X/week   Barriers to D/C:            Co-evaluation              AM-PAC OT "6 Clicks" Daily Activity     Outcome Measure Help from another person eating meals?: None Help from another person taking care of personal grooming?: A Little Help from another person toileting, which includes using toliet, bedpan, or urinal?: A Lot Help from another person bathing (including washing, rinsing, drying)?: A Lot Help from another person to put on and  taking off regular upper body clothing?: A Little Help from another person to put on and taking off regular lower body clothing?: A Lot 6 Click Score: 16   End of Session Equipment Utilized During Treatment: Gait belt;Rolling walker Nurse Communication: Mobility status;Patient requests pain meds  Activity Tolerance: Patient tolerated treatment well Patient left: in bed;with call bell/phone within reach;with bed alarm set  OT Visit Diagnosis: Unsteadiness on feet (R26.81);Muscle weakness (generalized) (M62.81);History of falling (Z91.81)                Time: 7867-6720 OT Time Calculation (min): 26 min Charges:  OT General Charges $OT Visit: 1 Visit OT Evaluation $OT Eval Moderate Complexity: 1 Mod OT Treatments $Self Care/Home Management : 8-22 mins  Matthew Scale, MS, OTR/L ascom [phone] 02/12/21, 4:05 PM

## 2021-02-12 NOTE — Progress Notes (Addendum)
PROGRESS NOTE  Matthew [name]    DOB: [birth year], 87 y.o.  MVH:[id number]  PCP: Jodi Marble, MD   Code Status: DNR   DOA: 02/11/2021   LOS: 0  Brief Narrative of Current Hospitalization  Matthew [name] is a 87 y.o. male with a PMH significant for DM type II, ESRD on hemodialysis (Tuesday/Thursday/Saturday), CAD s/p CABG, hypertension, hypothyroidism, CHF. They presented from home to the ED on 02/11/2021 with fall and R hip fx. In the ED, it was found that they had acute right superior and inferior rami fx. They were treated with pain control.  Patient was admitted to medicine service for further workup and management of chronic conditions as outlined in detail below.  02/12/21 -stable, ortho has signed off for outpatient follow up in 2-3 weeks  Assessment & Plan  Active Problems:   Pelvic fracture ([place])  R Pubic rami fracture- non-surgical, per ortho consult. Patient is able to move legs and bear weight with minimal pain.  - pain management -PT, OT  -TOC consult for SNF placement - f/u ortho outpatient in 2-3 weeks - WBAT  Recent R humerus fracture- was supposed to follow up with ortho this week for monitoring  - R humerus xray ordered to monitor healing   ESRD on HD (T/TH/Sat). Stable vitals and labs. Reached out to nephro for routine dialysis today - HD management per nephro   CAD s/p CABG  CHF - Continue Plavix, Crestor, Zetia, amiodarone  H/o shingles with recent flare - continue acyclovir   Hypothyroidism - Continue Synthroid   Type 2 diabetes mellitus with ESRD- Last A1c of 6.3 in 10/2020 which is well below goal of 8 for age.  - continue CBG monitoring with labs. Hold off on insulin for now - continue home gabapentin   Anemia of chronic kidney disease- Hgb slightly reduced at 9.2. - CBC am - continue home iron   History of orthostatic hypotension - continue home Midodrine 10 mg po QID  DVT prophylaxis: heparin injection 5,000 Units Start: 02/11/21  1400 SCDs Start: 02/11/21 1057   Diet:  Diet Orders (From admission, onward)     Start     Ordered   02/11/21 1057  Diet heart healthy/carb modified Room service appropriate? Yes; Fluid consistency: Thin  Diet effective now       Question Answer Comment  Diet-HS Snack? Nothing   Room service appropriate? Yes   Fluid consistency: Thin      02/11/21 1058            Subjective 02/12/21    Pt reports minimal pain. He has no complaints or questions at this time  Disposition Plan & Communication  Status is: Observation  The patient will require care spanning > 2 midnights and should be moved to inpatient because: Unsafe d/c plan. Will need SNF placement but is stable to be discharged to facility.  Dispo: The patient is from: Home              Anticipated d/c is to: SNF              Patient currently is medically stable to d/c.   Difficult to place patient No  Family Communication: daughter at bedside   Consults, Procedures, Significant Events  Consultants:  Ortho nephro  Procedures/significant events:  HD  Antimicrobials:  Anti-infectives (From admission, onward)    None        Objective   Vitals:   02/11/21 2150 02/11/21 2344 02/12/21 0009  02/12/21 0607  BP: (!) 149/65  (!) 148/74 (!) 143/70  Pulse: 86  84 75  Resp: 20  18 16   Temp:   99.2 F (37.3 C) 98.2 F (36.8 C)  TempSrc:  Oral Oral   SpO2: 97%  97% 99%  Weight: 96.6 kg   98.6 kg  Height: 6' (1.829 m)       Intake/Output Summary (Last 24 hours) at 02/12/2021 4431 Last data filed at 02/12/2021 5400 Gross per 24 hour  Intake 240 ml  Output 525 ml  Net -285 ml   Filed Weights   02/11/21 0844 02/11/21 2150 02/12/21 0607  Weight: 94.8 kg 96.6 kg 98.6 kg    Patient BMI: Body mass index is 29.47 kg/m.   Physical Exam: General: awake, alert, NAD HEENT: atraumatic, clear conjunctiva, anicteric sclera, moist mucus membranes, hearing grossly normal Respiratory: normal respiratory  effort. Cardiovascular: normal S1/S2,  RRR, no JVD, murmurs, rubs, gallops, quick capillary refill  Nervous: A&O x4. no gross focal neurologic deficits, normal speech Extremities: moves all, no edema, normal tone Skin: dry, intact, normal temperature, normal color, No rashes, lesions or ulcers Psychiatry: normal mood, congruent affect, judgement and insight appear normal  Labs   I have personally reviewed following labs and imaging studies Admission on 02/11/2021  Component Date Value Ref Range Status   WBC 02/11/2021 9.0  4.0 - 10.5 K/uL Final   RBC 02/11/2021 3.08 (A) 4.22 - 5.81 MIL/uL Final   Hemoglobin 02/11/2021 9.8 (A) 13.0 - 17.0 g/dL Final   HCT 02/11/2021 29.6 (A) 39.0 - 52.0 % Final   MCV 02/11/2021 96.1  80.0 - 100.0 fL Final   MCH 02/11/2021 31.8  26.0 - 34.0 pg Final   MCHC 02/11/2021 33.1  30.0 - 36.0 g/dL Final   RDW 02/11/2021 14.7  11.5 - 15.5 % Final   Platelets 02/11/2021 153  150 - 400 K/uL Final   nRBC 02/11/2021 0.0  0.0 - 0.2 % Final   Neutrophils Relative % 02/11/2021 75  % Final   Neutro Abs 02/11/2021 6.6  1.7 - 7.7 K/uL Final   Lymphocytes Relative 02/11/2021 8  % Final   Lymphs Abs 02/11/2021 0.7  0.7 - 4.0 K/uL Final   Monocytes Relative 02/11/2021 10  % Final   Monocytes Absolute 02/11/2021 0.9  0.1 - 1.0 K/uL Final   Eosinophils Relative 02/11/2021 5  % Final   Eosinophils Absolute 02/11/2021 0.5  0.0 - 0.5 K/uL Final   Basophils Relative 02/11/2021 1  % Final   Basophils Absolute 02/11/2021 0.1  0.0 - 0.1 K/uL Final   Immature Granulocytes 02/11/2021 1  % Final   Abs Immature Granulocytes 02/11/2021 0.12 (A) 0.00 - 0.07 K/uL Final   Sodium 02/11/2021 140  135 - 145 mmol/L Final   Potassium 02/11/2021 4.1  3.5 - 5.1 mmol/L Final   Chloride 02/11/2021 100  98 - 111 mmol/L Final   CO2 02/11/2021 31  22 - 32 mmol/L Final   Glucose, Bld 02/11/2021 109 (A) 70 - 99 mg/dL Final   BUN 02/11/2021 36 (A) 8 - 23 mg/dL Final   Creatinine, Ser 02/11/2021 2.96  (A) 0.61 - 1.24 mg/dL Final   Calcium 02/11/2021 8.7 (A) 8.9 - 10.3 mg/dL Final   Total Protein 02/11/2021 6.3 (A) 6.5 - 8.1 g/dL Final   Albumin 02/11/2021 3.2 (A) 3.5 - 5.0 g/dL Final   AST 02/11/2021 33  15 - 41 U/L Final   ALT 02/11/2021 27  0 - 44  U/L Final   Alkaline Phosphatase 02/11/2021 149 (A) 38 - 126 U/L Final   Total Bilirubin 02/11/2021 1.0  0.3 - 1.2 mg/dL Final   GFR, Estimated 02/11/2021 20 (A) >60 mL/min Final   Anion gap 02/11/2021 9  5 - 15 Final   Prothrombin Time 02/11/2021 13.8  11.4 - 15.2 seconds Final   INR 02/11/2021 1.1  0.8 - 1.2 Final   SARS Coronavirus 2 by RT PCR 02/11/2021 NEGATIVE  NEGATIVE Final   Influenza A by PCR 02/11/2021 NEGATIVE  NEGATIVE Final   Influenza B by PCR 02/11/2021 NEGATIVE  NEGATIVE Final   Hgb A1c MFr Bld 02/11/2021 6.2 (A) 4.8 - 5.6 % Final   Mean Plasma Glucose 02/11/2021 131  mg/dL Final   Glucose-Capillary 02/11/2021 208 (A) 70 - 99 mg/dL Final   Sodium 02/12/2021 136  135 - 145 mmol/L Final   Potassium 02/12/2021 4.7  3.5 - 5.1 mmol/L Final   Chloride 02/12/2021 101  98 - 111 mmol/L Final   CO2 02/12/2021 27  22 - 32 mmol/L Final   Glucose, Bld 02/12/2021 120 (A) 70 - 99 mg/dL Final   BUN 02/12/2021 42 (A) 8 - 23 mg/dL Final   Creatinine, Ser 02/12/2021 3.35 (A) 0.61 - 1.24 mg/dL Final   Calcium 02/12/2021 8.7 (A) 8.9 - 10.3 mg/dL Final   GFR, Estimated 02/12/2021 17 (A) >60 mL/min Final   Anion gap 02/12/2021 8  5 - 15 Final   WBC 02/12/2021 7.8  4.0 - 10.5 K/uL Final   RBC 02/12/2021 2.93 (A) 4.22 - 5.81 MIL/uL Final   Hemoglobin 02/12/2021 9.2 (A) 13.0 - 17.0 g/dL Final   HCT 02/12/2021 28.1 (A) 39.0 - 52.0 % Final   MCV 02/12/2021 95.9  80.0 - 100.0 fL Final   MCH 02/12/2021 31.4  26.0 - 34.0 pg Final   MCHC 02/12/2021 32.7  30.0 - 36.0 g/dL Final   RDW 02/12/2021 14.6  11.5 - 15.5 % Final   Platelets 02/12/2021 136 (A) 150 - 400 K/uL Final   nRBC 02/12/2021 0.0  0.0 - 0.2 % Final   Glucose-Capillary 02/11/2021 117  (A) 70 - 99 mg/dL Final   Glucose-Capillary 02/11/2021 121 (A) 70 - 99 mg/dL Final   Comment 1 02/11/2021 Notify RN   Final   Comment 2 02/11/2021 Document in Chart   Final   MRSA by PCR Next Gen 02/11/2021 NOT DETECTED  NOT DETECTED Final     Recent Results (from the past 240 hour(s))  Resp Panel by RT-PCR (Flu A&B, Covid) Nasopharyngeal Swab     Status: None   Collection Time: 02/11/21  9:45 AM   Specimen: Nasopharyngeal Swab; Nasopharyngeal(NP) swabs in vial transport medium  Result Value Ref Range Status   SARS Coronavirus 2 by RT PCR NEGATIVE NEGATIVE Final    Comment: (NOTE) SARS-CoV-2 target nucleic acids are NOT DETECTED.  The SARS-CoV-2 RNA is generally detectable in upper respiratory specimens during the acute phase of infection. The lowest concentration of SARS-CoV-2 viral copies this assay can detect is 138 copies/mL. A negative result does not preclude SARS-Cov-2 infection and should not be used as the sole basis for treatment or other patient management decisions. A negative result may occur with  improper specimen collection/handling, submission of specimen other than nasopharyngeal swab, presence of viral mutation(s) within the areas targeted by this assay, and inadequate number of viral copies(<138 copies/mL). A negative result must be combined with clinical observations, patient history, and epidemiological information. The expected result  is Negative.  Fact Sheet for Patients:  EntrepreneurPulse.com.au  Fact Sheet for Healthcare Providers:  IncredibleEmployment.be  This test is no t yet approved or cleared by the [place] FDA and  has been authorized for detection and/or diagnosis of SARS-CoV-2 by FDA under an Emergency Use Authorization (EUA). This EUA will remain  in effect (meaning this test can be used) for the duration of the COVID-19 declaration under Section 564(b)(1) of the Act, 21 U.S.C.section 360bbb-3(b)(1),  unless the authorization is terminated  or revoked sooner.       Influenza A by PCR NEGATIVE NEGATIVE Final   Influenza B by PCR NEGATIVE NEGATIVE Final    Comment: (NOTE) The Xpert Xpress SARS-CoV-2/FLU/RSV plus assay is intended as an aid in the diagnosis of influenza from Nasopharyngeal swab specimens and should not be used as a sole basis for treatment. Nasal washings and aspirates are unacceptable for Xpert Xpress SARS-CoV-2/FLU/RSV testing.  Fact Sheet for Patients: EntrepreneurPulse.com.au  Fact Sheet for Healthcare Providers: IncredibleEmployment.be  This test is not yet approved or cleared by the [place] FDA and has been authorized for detection and/or diagnosis of SARS-CoV-2 by FDA under an Emergency Use Authorization (EUA). This EUA will remain in effect (meaning this test can be used) for the duration of the COVID-19 declaration under Section 564(b)(1) of the Act, 21 U.S.C. section 360bbb-3(b)(1), unless the authorization is terminated or revoked.  Performed at [facility], [address] [place], [place] [address]   MRSA Next Gen by PCR, Nasal     Status: None   Collection Time: 02/11/21 10:58 PM   Specimen: Nasal Mucosa; Nasal Swab  Result Value Ref Range Status   MRSA by PCR Next Gen NOT DETECTED NOT DETECTED Final    Comment: (NOTE) The GeneXpert MRSA Assay (FDA approved for NASAL specimens only), is one component of a comprehensive MRSA colonization surveillance program. It is not intended to diagnose MRSA infection nor to guide or monitor treatment for MRSA infections. Test performance is not FDA approved in patients less than 79 years old. Performed at [facility], [place], [place] [address]      Imaging Studies  DG Chest 1 View  Result Date: 02/11/2021 CLINICAL DATA:  87 year old male status post fall. EXAM: CHEST  1 VIEW COMPARISON:  Chest radiographs 11/03/2020 and  earlier. FINDINGS: AP view at 0906 hours. New dual lumen dialysis type right chest catheter. Chronic cardiomegaly and tortuosity of the thoracic aorta. Prior CABG. Stable cardiac size and mediastinal contours. Visualized tracheal air column is within normal limits. No pneumothorax, pulmonary edema. The right lung remains clear. Increased hypo ventilation at the left lung base. Small left pleural effusion difficult to exclude. No consolidation. No acute osseous abnormality identified. IMPRESSION: 1. Difficult to exclude small left pleural effusion. But otherwise no acute cardiopulmonary abnormality or acute traumatic injury identified. 2. Chronic cardiomegaly, tortuous aorta. Electronically Signed   By: Genevie Ann M.D.   On: 02/11/2021 09:55   DG HIP UNILAT WITH PELVIS 2-3 VIEWS LEFT  Result Date: 02/11/2021 CLINICAL DATA:  Left hip pain after fall. EXAM: DG HIP (WITH OR WITHOUT PELVIS) 2-3V LEFT COMPARISON:  None. FINDINGS: There is no evidence of hip fracture or dislocation. No significant joint space narrowing is noted. Mild osteophyte formation is noted. 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 No acute abnormality is noted. Electronically Signed   By: Marijo Conception M.D.   On: 02/11/2021 09:57   DG Hip  Unilat  With Pelvis 2-3 Views Right  Result Date: 02/11/2021 CLINICAL DATA:  Status post fall. EXAM: DG HIP (WITH OR WITHOUT PELVIS) 2-3V RIGHT COMPARISON:  None FINDINGS: The bones appear diffusely osteopenic. There is no evidence of hip fracture or dislocation. There are acute right superior and inferior pubic rami fractures. Mild degenerative changes are noted involving the right hip. IMPRESSION: Acute right superior and inferior pubic rami fractures. Electronically Signed   By: Kerby Moors M.D.   On: 02/11/2021 09:56   Medications   Scheduled Meds:  amiodarone  200 mg Oral Daily   Chlorhexidine Gluconate Cloth  6 each Topical Q0600   cholecalciferol  1,000 Units Oral Q  breakfast   clopidogrel  75 mg Oral Q breakfast   ezetimibe  10 mg Oral QAC breakfast   ferrous sulfate  325 mg Oral BID WC   gabapentin  300 mg Oral TID   heparin  5,000 Units Subcutaneous Q8H   insulin aspart  0-5 Units Subcutaneous QHS   insulin aspart  0-9 Units Subcutaneous TID WC   levothyroxine  75 mcg Oral QAC breakfast   loratadine  10 mg Oral Daily   midodrine  10 mg Oral QID   multivitamin  1 tablet Oral Daily   polyethylene glycol  17 g Oral Daily   rosuvastatin  40 mg Oral QHS   senna-docusate  2 tablet Oral BID   Continuous Infusions:  sodium chloride     sodium chloride       LOS: 0 days   Time spent: >35 min   Richarda Osmond, DO [facility] 02/12/2021, 7:22 AM   To contact the [facility] Attending or Consulting provider for this patient: Check the care team in [facility] for a) attending/consulting [place] provider listed and b) the [facility] team listed Log into [email or web address] and use [place]'s universal password to access. If you do not have the password, please contact the hospital operator. Locate the [facility] provider you are looking for under [facility] and page to a number that you can be directly reached. If you still have difficulty reaching the provider, please page the [facility] (Director on Call) for the Hospitalists listed on amion for assistance.

## 2021-02-13 DIAGNOSIS — S32511A Fracture of superior rim of right pubis, initial encounter for closed fracture: Secondary | ICD-10-CM | POA: Diagnosis not present

## 2021-02-13 LAB — CBC
HCT: 29.2 % — ABNORMAL LOW (ref 39.0–52.0)
Hemoglobin: 9.5 g/dL — ABNORMAL LOW (ref 13.0–17.0)
MCH: 31.4 pg (ref 26.0–34.0)
MCHC: 32.5 g/dL (ref 30.0–36.0)
MCV: 96.4 fL (ref 80.0–100.0)
Platelets: 137 10*3/uL — ABNORMAL LOW (ref 150–400)
RBC: 3.03 MIL/uL — ABNORMAL LOW (ref 4.22–5.81)
RDW: 14.6 % (ref 11.5–15.5)
WBC: 6.4 10*3/uL (ref 4.0–10.5)
nRBC: 0 % (ref 0.0–0.2)

## 2021-02-13 LAB — GLUCOSE, CAPILLARY
Glucose-Capillary: 93 mg/dL (ref 70–99)
Glucose-Capillary: 119 mg/dL — ABNORMAL HIGH (ref 70–99)
Glucose-Capillary: 155 mg/dL — ABNORMAL HIGH (ref 70–99)
Glucose-Capillary: 171 mg/dL — ABNORMAL HIGH (ref 70–99)

## 2021-02-13 LAB — HEPATITIS B SURFACE ANTIBODY, QUANTITATIVE: Hep B S AB Quant (Post): 29.5 m[IU]/mL (ref >9.9)

## 2021-02-13 NOTE — Progress Notes (Signed)
[facility]  ROUNDING NOTE   Subjective:   Matthew [name] is an 87 year old male with medical concerns including CAD status post CABG, hypothyroidism, hypertension, CHF, type 2 diabetes, and end-stage renal disease on hemodialysis.  Patient presents to the ED after falling on his right hip and complaining of right hip pain.  He has been admitted to [facility] for Pelvic fracture ([place]) [S32.9XXA] Fall [W19.XXXA] Multiple closed fractures of pelvis without disruption of pelvic ring, initial encounter [facility]) [S32.82XA]  Patient is known to our practice and receives outpatient treatments at [facility], supervised by Dr. Holley Raring.  He has maintained all outpatient dialysis treatments.  He was recently admitted for a fall that resulted in a right humeral fracture, right arm remains in a sling.   Patient seen sitting up in bed Tolerating meals States pain controlled  Objective:  Vital signs in last 24 hours:  Temp:  [97.9 F (36.6 C)-98.4 F (36.9 C)] 97.9 F (36.6 C) (09/23 1001) Pulse Rate:  [68-78] 75 (09/23 1001) Resp:  [9-18] 16 (09/23 1001) BP: (97-148)/(51-105) 97/51 (09/23 1001) SpO2:  [93 %-100 %] 93 % (09/23 1001) Weight:  [94.8 kg] 94.8 kg (09/23 0550)  Weight change: -0.002 kg Filed Weights   02/11/21 2150 02/12/21 0607 02/13/21 0550  Weight: 96.6 kg 98.6 kg 94.8 kg    Intake/Output: I/O last 3 completed shifts: In: 240 [P.O.:240] Out: 1025 [Urine:1025]   Intake/Output this shift:  No intake/output data recorded.  Physical Exam: General: NAD, sitting up in bed  Head: Normocephalic, atraumatic. Moist oral mucosal membranes  Eyes: Anicteric  Lungs:  Clear to auscultation, normal effort  Heart: Regular rate and rhythm  Abdomen:  Soft, nontender, nondistended  Extremities: No peripheral edema.  Neurologic: Alert, moving all four extremities  Skin: Red, macular rash right breast to back  Access: Right PermCath    Basic Metabolic Panel: Recent Labs   Lab 02/11/21 0847 02/12/21 0325  NA 140 136  K 4.1 4.7  CL 100 101  CO2 31 27  GLUCOSE 109* 120*  BUN 36* 42*  CREATININE 2.96* 3.35*  CALCIUM 8.7* 8.7*     Liver Function Tests: Recent Labs  Lab 02/11/21 0847  AST 33  ALT 27  ALKPHOS 149*  BILITOT 1.0  PROT 6.3*  ALBUMIN 3.2*    No results for input(s): LIPASE, AMYLASE in the last 168 hours. No results for input(s): AMMONIA in the last 168 hours.  CBC: Recent Labs  Lab 02/11/21 0847 02/12/21 0325 02/13/21 0634  WBC 9.0 7.8 6.4  NEUTROABS 6.6  --   --   HGB 9.8* 9.2* 9.5*  HCT 29.6* 28.1* 29.2*  MCV 96.1 95.9 96.4  PLT 153 136* 137*     Cardiac Enzymes: No results for input(s): CKTOTAL, CKMB, CKMBINDEX, TROPONINI in the last 168 hours.  BNP: Invalid input(s): POCBNP  CBG: Recent Labs  Lab 02/12/21 0817 02/12/21 1142 02/12/21 1748 02/12/21 2304 02/13/21 0800  GLUCAP 97 171* 108* 107* 93     Microbiology: Results for orders placed or performed during the hospital encounter of 02/11/21  Resp Panel by RT-PCR (Flu A&B, Covid) Nasopharyngeal Swab     Status: None   Collection Time: 02/11/21  9:45 AM   Specimen: Nasopharyngeal Swab; Nasopharyngeal(NP) swabs in vial transport medium  Result Value Ref Range Status   SARS Coronavirus 2 by RT PCR NEGATIVE NEGATIVE Final    Comment: (NOTE) SARS-CoV-2 target nucleic acids are NOT DETECTED.  The SARS-CoV-2 RNA is generally detectable  in upper respiratory specimens during the acute phase of infection. The lowest concentration of SARS-CoV-2 viral copies this assay can detect is 138 copies/mL. A negative result does not preclude SARS-Cov-2 infection and should not be used as the sole basis for treatment or other patient management decisions. A negative result may occur with  improper specimen collection/handling, submission of specimen other than nasopharyngeal swab, presence of viral mutation(s) within the areas targeted by this assay, and inadequate  number of viral copies(<138 copies/mL). A negative result must be combined with clinical observations, patient history, and epidemiological information. The expected result is Negative.  Fact Sheet for Patients:  EntrepreneurPulse.com.au  Fact Sheet for Healthcare Providers:  IncredibleEmployment.be  This test is no t yet approved or cleared by the [place] FDA and  has been authorized for detection and/or diagnosis of SARS-CoV-2 by FDA under an Emergency Use Authorization (EUA). This EUA will remain  in effect (meaning this test can be used) for the duration of the COVID-19 declaration under Section 564(b)(1) of the Act, 21 U.S.C.section 360bbb-3(b)(1), unless the authorization is terminated  or revoked sooner.       Influenza A by PCR NEGATIVE NEGATIVE Final   Influenza B by PCR NEGATIVE NEGATIVE Final    Comment: (NOTE) The Xpert Xpress SARS-CoV-2/FLU/RSV plus assay is intended as an aid in the diagnosis of influenza from Nasopharyngeal swab specimens and should not be used as a sole basis for treatment. Nasal washings and aspirates are unacceptable for Xpert Xpress SARS-CoV-2/FLU/RSV testing.  Fact Sheet for Patients: EntrepreneurPulse.com.au  Fact Sheet for Healthcare Providers: IncredibleEmployment.be  This test is not yet approved or cleared by the [place] FDA and has been authorized for detection and/or diagnosis of SARS-CoV-2 by FDA under an Emergency Use Authorization (EUA). This EUA will remain in effect (meaning this test can be used) for the duration of the COVID-19 declaration under Section 564(b)(1) of the Act, 21 U.S.C. section 360bbb-3(b)(1), unless the authorization is terminated or revoked.  Performed at [facility], [address] [place], [place] [address]   MRSA Next Gen by PCR, Nasal     Status: None   Collection Time: 02/11/21 10:58 PM   Specimen: Nasal  Mucosa; Nasal Swab  Result Value Ref Range Status   MRSA by PCR Next Gen NOT DETECTED NOT DETECTED Final    Comment: (NOTE) The GeneXpert MRSA Assay (FDA approved for NASAL specimens only), is one component of a comprehensive MRSA colonization surveillance program. It is not intended to diagnose MRSA infection nor to guide or monitor treatment for MRSA infections. Test performance is not FDA approved in patients less than 92 years old. Performed at [facility], [address] [place], [place] [address]     Coagulation Studies: Recent Labs    02/11/21 0847  LABPROT 13.8  INR 1.1     Urinalysis: No results for input(s): COLORURINE, LABSPEC, PHURINE, GLUCOSEU, HGBUR, BILIRUBINUR, KETONESUR, PROTEINUR, UROBILINOGEN, NITRITE, LEUKOCYTESUR in the last 72 hours.  Invalid input(s): APPERANCEUR    Imaging: DG Humerus Right  Result Date: 02/12/2021 CLINICAL DATA:  Follow-up humeral fracture EXAM: RIGHT HUMERUS - 2+ VIEW COMPARISON:  12/25/2020 FINDINGS: Proximal right humeral fracture is again identified with some slight reduction in the degree of displacement of the fracture fragments. Humeral head is well seated. Increased callus formation is noted consistent with interval healing. No new fracture is seen. Degenerative changes of the acromioclavicular joint are again noted. IMPRESSION: Healing proximal right humeral fracture with some reduction of  the fracture fragments. Electronically Signed   By: Inez Catalina M.D.   On: 02/12/2021 16:34     Medications:      amiodarone  200 mg Oral Daily   Chlorhexidine Gluconate Cloth  6 each Topical Q0600   cholecalciferol  1,000 Units Oral Q breakfast   clopidogrel  75 mg Oral Q breakfast   ezetimibe  10 mg Oral QAC breakfast   ferrous sulfate  325 mg Oral BID WC   gabapentin  300 mg Oral TID   heparin  5,000 Units Subcutaneous Q8H   insulin aspart  0-5 Units Subcutaneous QHS   insulin aspart  0-9 Units Subcutaneous TID WC    levothyroxine  75 mcg Oral QAC breakfast   loratadine  10 mg Oral Daily   midodrine  10 mg Oral QID   multivitamin  1 tablet Oral Daily   polyethylene glycol  17 g Oral Daily   rosuvastatin  40 mg Oral QHS   senna-docusate  2 tablet Oral BID   acyclovir ointment, bisacodyl, fluticasone, HYDROcodone-acetaminophen, nitroGLYCERIN, traMADol  Assessment/ Plan:  Mr. KASTEN LEVEQUE is a 87 y.o.  male medical concerns including CAD status post CABG, hypothyroidism, hypertension, CHF, type 2 diabetes, and end-stage renal disease on hemodialysis.  Patient presents to the ED after falling on his right hip and complaining of right hip pain.  He has been admitted to [facility] for Pelvic fracture ([place]) [S32.9XXA] Fall [W19.XXXA] Multiple closed fractures of pelvis without disruption of pelvic ring, initial encounter ([place]) [S32.82XA]   [facility] TTS [address] RIJ permcath 98.5kg  End stage renal disease on dialysis.  Will maintain outpatient schedule if possible.  Last dialysis received 02/10/21.  Received dialysis yesterday,  UF goal 1.5L.  Next treatment scheduled for Saturday.  Anemia of chronic disease; hemoglobin 9.5, within acceptable range.  Secondary Hyperparathyroidism:  Lab Results  Component Value Date   CALCIUM 8.7 (L) 02/12/2021   PHOS 4.4 01/03/2021  Will continue to monitor  Hypertension 97/51. Home regimen includes midodrine, metoprolol and furosemide. Midodrine continued  Right Pubic rami fracture-nonsurgical interventions per orthopedics.  Continue pain management, PT/OT prescribed.   Plan for rehab at discharge.    LOS: 1   9/23/202210:55 AM

## 2021-02-13 NOTE — Progress Notes (Signed)
Pt just got back from dialysis. Will continue to monitor.

## 2021-02-13 NOTE — TOC Progression Note (Signed)
Transition of Care [facility]) - Progression Note    Patient Details  Name: Matthew [name] MRN: [id number] Date of Birth: December 13, 1930  Transition of Care [facility]) CM/SW [place], RN Phone Number: 02/13/2021, 11:09 AM  Clinical Narrative:    The patient's daughter requested a bed search be sent to the [facility] in [place], I sent the request thru the Hub and starred as preffered   Expected Discharge Plan: [place] Barriers to Discharge: Continued Medical Work up  Expected Discharge Plan and Services Expected Discharge Plan: [place]                                               Social Determinants of Health (SDOH) Interventions    Readmission Risk Interventions Readmission Risk Prevention Plan 02/12/2021  Transportation Screening Complete  Medication Review Press photographer) Complete  SW Recovery Care/Counseling Consult Complete  Skilled Nursing Facility Complete  Some recent data might be hidden

## 2021-02-13 NOTE — Progress Notes (Signed)
PROGRESS NOTE    Matthew [name]  ZJQ:[id number] DOB: Sep 16, 1930 DOA: 02/11/2021 PCP: Jodi Marble, MD   Brief Narrative:  This 87 y.o. male with PMH significant for DM type II, ESRD on hemodialysis (Tuesday/Thursday/Saturday), CAD s/p CABG, hypertension, hypothyroidism, CHF presented from home to the ED on 02/11/2021 status post fall with Right hip fracture. In the ED,  He was found to have acute right superior and inferior rami fx. it was treated with pain control.  Orthopedics was consulted recommended nonoperative management.  Advised weightbearing as tolerated.  Patient is awaiting SNF placement.  Assessment & Plan:   Active Problems:   Pelvic fracture (HCC)  Right pubic ramus fracture: Patient presented s/p fall with right pubic ramus fracture. Orthopedic consulted,  recommended nonoperative management. Patient is able to move legs and bear weight with minimal pain. Continue adequate pain control. PT and OT recommended  skilled nursing facility. Follow-up orthopedics in 2 to 3 weeks.  Weightbearing as tolerated  Recent right humeral fracture: Patient was supposed to follow-up with orthopedics this week X-ray right humerus : Healing proximal right humeral fracture with some reduction of the fracture fragments. Continue adequate pain control.  ESRD on hemodialysis(TTS): Continue hemodialysis as per schedule. Nephrology consulted for continuation.  CAD s/p CABG /CHF: Continue Plavix, Crestor, Zetia, amiodarone   History of shingles with recent flare: Continue acyclovir  Hypothyroidism Continue Synthroid  Type 2 diabetes Last hemoglobin A1c 6.3. Hold off insulin for now. Continue home gabapentin  Anemia of chronic kidney disease: Hemoglobin remained stable at 9.2  History of orthostatic hypotension: Continue midodrine 10 mg 4 times daily     DVT prophylaxis: heparin sq Code Status: DNR Family Communication: No family at bedside Disposition Plan:     Status is: Inpatient  Remains inpatient appropriate because:Inpatient level of care appropriate due to severity of illness  Dispo: The patient is from: Home              Anticipated d/c is to: SNF              Patient currently is not medically stable to d/c.   Difficult to place patient No   Consultants:  Orthopedics  Procedures: Nonoperative management. Antimicrobials:   Anti-infectives (From admission, onward)    None       Subjective: Patient was seen and examined at bedside.  Patient reports feeling much improved.   He still reports having right hip pain when ambulates.  Denies any other concerns.   Patient is awaiting SNF.  Objective: Vitals:   02/12/21 2308 02/13/21 0356 02/13/21 0550 02/13/21 1001  BP: (!) 142/66 133/69  (!) 97/51  Pulse: 78 72  75  Resp: 18 16  16   Temp: 98.4 F (36.9 C) 98.3 F (36.8 C)  97.9 F (36.6 C)  TempSrc: Oral Oral  Oral  SpO2: 98% 96%  93%  Weight:   94.8 kg   Height:        Intake/Output Summary (Last 24 hours) at 02/13/2021 1324 Last data filed at 02/13/2021 0359 Gross per 24 hour  Intake --  Output 250 ml  Net -250 ml   Filed Weights   02/11/21 2150 02/12/21 0607 02/13/21 0550  Weight: 96.6 kg 98.6 kg 94.8 kg    Examination:  General exam: Appears comfortable, not in any acute distress. Respiratory system: Clear to auscultation bilaterally, no wheezing, no murmur. Cardiovascular system: S1-S2 positive, regular rate and rhythm, no murmur. Gastrointestinal system: Abdomen is soft, nontender, nondistended, BS +  Central nervous system: Alert and oriented x 3 . No focal neurological deficits. Extremities: No edema, no cyanosis, no clubbing.  Right hip tenderness+, restricted movement. Skin: No rashes, lesions or ulcers Psychiatry: Judgement and insight appear normal. Mood & affect appropriate.     Data Reviewed: I have personally reviewed following labs and imaging studies  CBC: Recent Labs  Lab 02/11/21 0847  02/12/21 0325 02/13/21 0634  WBC 9.0 7.8 6.4  NEUTROABS 6.6  --   --   HGB 9.8* 9.2* 9.5*  HCT 29.6* 28.1* 29.2*  MCV 96.1 95.9 96.4  PLT 153 136* 376*   Basic Metabolic Panel: Recent Labs  Lab 02/11/21 0847 02/12/21 0325  NA 140 136  K 4.1 4.7  CL 100 101  CO2 31 27  GLUCOSE 109* 120*  BUN 36* 42*  CREATININE 2.96* 3.35*  CALCIUM 8.7* 8.7*   GFR: Estimated Creatinine Clearance: 18.2 mL/min (A) (by C-G formula based on SCr of 3.35 mg/dL (H)). Liver Function Tests: Recent Labs  Lab 02/11/21 0847  AST 33  ALT 27  ALKPHOS 149*  BILITOT 1.0  PROT 6.3*  ALBUMIN 3.2*   No results for input(s): LIPASE, AMYLASE in the last 168 hours. No results for input(s): AMMONIA in the last 168 hours. Coagulation Profile: Recent Labs  Lab 02/11/21 0847  INR 1.1   Cardiac Enzymes: No results for input(s): CKTOTAL, CKMB, CKMBINDEX, TROPONINI in the last 168 hours. BNP (last 3 results) No results for input(s): PROBNP in the last 8760 hours. HbA1C: Recent Labs    02/11/21 1415  HGBA1C 6.2*   CBG: Recent Labs  Lab 02/12/21 1142 02/12/21 1748 02/12/21 2304 02/13/21 0800 02/13/21 1215  GLUCAP 171* 108* 107* 93 119*   Lipid Profile: No results for input(s): CHOL, HDL, LDLCALC, TRIG, CHOLHDL, LDLDIRECT in the last 72 hours. Thyroid Function Tests: No results for input(s): TSH, T4TOTAL, FREET4, T3FREE, THYROIDAB in the last 72 hours. Anemia Panel: No results for input(s): VITAMINB12, FOLATE, FERRITIN, TIBC, IRON, RETICCTPCT in the last 72 hours. Sepsis Labs: No results for input(s): PROCALCITON, LATICACIDVEN in the last 168 hours.  Recent Results (from the past 240 hour(s))  Resp Panel by RT-PCR (Flu A&B, Covid) Nasopharyngeal Swab     Status: None   Collection Time: 02/11/21  9:45 AM   Specimen: Nasopharyngeal Swab; Nasopharyngeal(NP) swabs in vial transport medium  Result Value Ref Range Status   SARS Coronavirus 2 by RT PCR NEGATIVE NEGATIVE Final    Comment:  (NOTE) SARS-CoV-2 target nucleic acids are NOT DETECTED.  The SARS-CoV-2 RNA is generally detectable in upper respiratory specimens during the acute phase of infection. The lowest concentration of SARS-CoV-2 viral copies this assay can detect is 138 copies/mL. A negative result does not preclude SARS-Cov-2 infection and should not be used as the sole basis for treatment or other patient management decisions. A negative result may occur with  improper specimen collection/handling, submission of specimen other than nasopharyngeal swab, presence of viral mutation(s) within the areas targeted by this assay, and inadequate number of viral copies(<138 copies/mL). A negative result must be combined with clinical observations, patient history, and epidemiological information. The expected result is Negative.  Fact Sheet for Patients:  EntrepreneurPulse.com.au  Fact Sheet for Healthcare Providers:  IncredibleEmployment.be  This test is no t yet approved or cleared by the [place] FDA and  has been authorized for detection and/or diagnosis of SARS-CoV-2 by FDA under an Emergency Use Authorization (EUA). This EUA will remain  in effect (meaning  this test can be used) for the duration of the COVID-19 declaration under Section 564(b)(1) of the Act, 21 U.S.C.section 360bbb-3(b)(1), unless the authorization is terminated  or revoked sooner.       Influenza A by PCR NEGATIVE NEGATIVE Final   Influenza B by PCR NEGATIVE NEGATIVE Final    Comment: (NOTE) The Xpert Xpress SARS-CoV-2/FLU/RSV plus assay is intended as an aid in the diagnosis of influenza from Nasopharyngeal swab specimens and should not be used as a sole basis for treatment. Nasal washings and aspirates are unacceptable for Xpert Xpress SARS-CoV-2/FLU/RSV testing.  Fact Sheet for Patients: EntrepreneurPulse.com.au  Fact Sheet for Healthcare  Providers: IncredibleEmployment.be  This test is not yet approved or cleared by the [place] FDA and has been authorized for detection and/or diagnosis of SARS-CoV-2 by FDA under an Emergency Use Authorization (EUA). This EUA will remain in effect (meaning this test can be used) for the duration of the COVID-19 declaration under Section 564(b)(1) of the Act, 21 U.S.C. section 360bbb-3(b)(1), unless the authorization is terminated or revoked.  Performed at [facility], [address] [place], [place] [address]   MRSA Next Gen by PCR, Nasal     Status: None   Collection Time: 02/11/21 10:58 PM   Specimen: Nasal Mucosa; Nasal Swab  Result Value Ref Range Status   MRSA by PCR Next Gen NOT DETECTED NOT DETECTED Final    Comment: (NOTE) The GeneXpert MRSA Assay (FDA approved for NASAL specimens only), is one component of a comprehensive MRSA colonization surveillance program. It is not intended to diagnose MRSA infection nor to guide or monitor treatment for MRSA infections. Test performance is not FDA approved in patients less than 70 years old. Performed at [facility], [address] [place], [place] [address]     Radiology Studies: DG Humerus Right  Result Date: 02/12/2021 CLINICAL DATA:  Follow-up humeral fracture EXAM: RIGHT HUMERUS - 2+ VIEW COMPARISON:  12/25/2020 FINDINGS: Proximal right humeral fracture is again identified with some slight reduction in the degree of displacement of the fracture fragments. Humeral head is well seated. Increased callus formation is noted consistent with interval healing. No new fracture is seen. Degenerative changes of the acromioclavicular joint are again noted. IMPRESSION: Healing proximal right humeral fracture with some reduction of the fracture fragments. Electronically Signed   By: Inez Catalina M.D.   On: 02/12/2021 16:34     Scheduled Meds:  amiodarone  200 mg Oral Daily   Chlorhexidine  Gluconate Cloth  6 each Topical Q0600   cholecalciferol  1,000 Units Oral Q breakfast   clopidogrel  75 mg Oral Q breakfast   ezetimibe  10 mg Oral QAC breakfast   ferrous sulfate  325 mg Oral BID WC   gabapentin  300 mg Oral TID   heparin  5,000 Units Subcutaneous Q8H   insulin aspart  0-5 Units Subcutaneous QHS   insulin aspart  0-9 Units Subcutaneous TID WC   levothyroxine  75 mcg Oral QAC breakfast   loratadine  10 mg Oral Daily   midodrine  10 mg Oral QID   multivitamin  1 tablet Oral Daily   polyethylene glycol  17 g Oral Daily   rosuvastatin  40 mg Oral QHS   senna-docusate  2 tablet Oral BID   Continuous Infusions:   LOS: 1 day    Time spent: 35 mins    Decarlo Rivet, MD [facility]   If 7PM-7AM, please contact night-coverage

## 2021-02-13 NOTE — Plan of Care (Signed)

## 2021-02-14 DIAGNOSIS — S32511A Fracture of superior rim of right pubis, initial encounter for closed fracture: Secondary | ICD-10-CM | POA: Diagnosis not present

## 2021-02-14 LAB — CBC
HCT: 28.2 % — ABNORMAL LOW (ref 39.0–52.0)
Hemoglobin: 9.2 g/dL — ABNORMAL LOW (ref 13.0–17.0)
MCH: 31.3 pg (ref 26.0–34.0)
MCHC: 32.6 g/dL (ref 30.0–36.0)
MCV: 95.9 fL (ref 80.0–100.0)
Platelets: 140 10*3/uL — ABNORMAL LOW (ref 150–400)
RBC: 2.94 MIL/uL — ABNORMAL LOW (ref 4.22–5.81)
RDW: 14.4 % (ref 11.5–15.5)
WBC: 6.4 10*3/uL (ref 4.0–10.5)
nRBC: 0 % (ref 0.0–0.2)

## 2021-02-14 LAB — GLUCOSE, CAPILLARY
Glucose-Capillary: 95 mg/dL (ref 70–99)
Glucose-Capillary: 140 mg/dL — ABNORMAL HIGH (ref 70–99)
Glucose-Capillary: 140 mg/dL — ABNORMAL HIGH (ref 70–99)
Glucose-Capillary: 135 mg/dL — ABNORMAL HIGH (ref 70–99)

## 2021-02-14 LAB — BASIC METABOLIC PANEL
Anion gap: 9 (ref 5–15)
BUN: 39 mg/dL — ABNORMAL HIGH (ref 8–23)
CO2: 28 mmol/L (ref 22–32)
Calcium: 8.6 mg/dL — ABNORMAL LOW (ref 8.9–10.3)
Chloride: 97 mmol/L — ABNORMAL LOW (ref 98–111)
Creatinine, Ser: 2.77 mg/dL — ABNORMAL HIGH (ref 0.61–1.24)
GFR, Estimated: 21 mL/min — ABNORMAL LOW (ref >60)
Glucose, Bld: 122 mg/dL — ABNORMAL HIGH (ref 70–99)
Potassium: 4.3 mmol/L (ref 3.5–5.1)
Sodium: 134 mmol/L — ABNORMAL LOW (ref 135–145)

## 2021-02-14 LAB — PHOSPHORUS: Phosphorus: 4.3 mg/dL (ref 2.5–4.6)

## 2021-02-14 LAB — MAGNESIUM: Magnesium: 1.9 mg/dL (ref 1.7–2.4)

## 2021-02-14 MED ORDER — MIDODRINE HCL 5 MG PO TABS
ORAL_TABLET | ORAL | Status: AC
  Filled 2021-02-14: qty 2

## 2021-02-14 MED ORDER — GABAPENTIN 300 MG PO CAPS
300.0000 mg | ORAL_CAPSULE | Freq: Every day | ORAL | Status: DC
  Administered 2021-02-14 – 2021-02-16 (×3): 300 mg via ORAL
  Filled 2021-02-14 (×3): qty 1

## 2021-02-14 NOTE — Progress Notes (Signed)
Patient attends dialysis session as 1:1, has right chest catheter, without s/sx of infection, flushes well, able to maintain prescribed BFR throughout session. Unable to meet targeted UF due to low B/P, evening dose of Midodrine given, UF held for lowing pressure, fluid removal 727. Patient expresses no concerns, returned to unit.

## 2021-02-14 NOTE — Progress Notes (Signed)
PROGRESS NOTE    Matthew [name]  ZSW:[id number] DOB: 02/02/1931 DOA: 02/11/2021 PCP: Jodi Marble, MD   Brief Narrative:  This 87 y.o. male with PMH significant for DM type II, ESRD on hemodialysis (Tuesday/Thursday/Saturday), CAD s/p CABG, hypertension, hypothyroidism, CHF presented from home to the ED on 02/11/2021 status post fall with Right hip fracture. In the ED,  He was found to have acute right superior and inferior rami fx. it was treated with pain control.  Orthopedics was consulted recommended nonoperative management.  Advised weightbearing as tolerated.  Patient is awaiting SNF placement.  Assessment & Plan:   Active Problems:   Pelvic fracture (HCC)  Right pubic ramus fracture: Orthopedic consulted,  recommended nonoperative management. Plan: --Weight-bear as tolerated for right lower extremity. --Follow-up orthopedics in 2 to 3 weeks.    Recent right humeral fracture: X-ray right humerus : Healing proximal right humeral fracture with some reduction of the fracture fragments. --Okay for use of walker with right upper extremity  ESRD on hemodialysis (TTS): --cont iHD per nephrology  CAD s/p CABG --cont Plavix, statin, Zetia  Chronic combined CHF --stable.  History of shingles with recent flare: Continue acyclovir  Hypothyroidism Continue Synthroid  Type 2 diabetes Last hemoglobin A1c 6.3. --BG have been within inpatient goal --d/c BG checks and SSI  Anemia of chronic kidney disease: Hemoglobin remained stable at 9.2  History of orthostatic hypotension: --cont midodrine 10 mg QID   DVT prophylaxis: heparin sq Code Status: DNR Family Communication: Disposition Plan:    Status is: Inpatient  Dispo: The patient is from: Home              Anticipated d/c is to: SNF              Patient currently is medically stable to d/c.   Difficult to place patient No   Consultants:  Orthopedics  Procedures: Nonoperative management. Antimicrobials:    Anti-infectives (From admission, onward)    None       Subjective: Pt reported pain only when working with PT.  Normal oral intake and having BM's.   Objective: Vitals:   02/13/21 2339 02/14/21 0529 02/14/21 0739 02/14/21 1543  BP: (!) 121/55 (!) 115/52 (!) 119/55 (!) 133/56  Pulse: 71 66 64 63  Resp: 18 18 16 16   Temp: 97.9 F (36.6 C) (!) 97.5 F (36.4 C) 97.8 F (36.6 C) 97.6 F (36.4 C)  TempSrc: Oral Oral Oral Oral  SpO2: 94% 95% 94% 100%  Weight:      Height:        Intake/Output Summary (Last 24 hours) at 02/14/2021 1611 Last data filed at 02/14/2021 1231 Gross per 24 hour  Intake --  Output 150 ml  Net -150 ml   Filed Weights   02/11/21 2150 02/12/21 0607 02/13/21 0550  Weight: 96.6 kg 98.6 kg 94.8 kg    Examination:  Constitutional: NAD, AAOx3 HEENT: conjunctivae and lids normal, EOMI CV: No cyanosis.   RESP: normal respiratory effort, on RA SKIN: warm, dry Neuro: II - XII grossly intact.   Psych: Normal mood and affect.  Appropriate judgement and reason   Data Reviewed: I have personally reviewed following labs and imaging studies  CBC: Recent Labs  Lab 02/11/21 0847 02/12/21 0325 02/13/21 0634 02/14/21 0446  WBC 9.0 7.8 6.4 6.4  NEUTROABS 6.6  --   --   --   HGB 9.8* 9.2* 9.5* 9.2*  HCT 29.6* 28.1* 29.2* 28.2*  MCV 96.1 95.9 96.4 95.9  PLT 153 136* 137* 315*   Basic Metabolic Panel: Recent Labs  Lab 02/11/21 0847 02/12/21 0325 02/14/21 0446  NA 140 136 134*  K 4.1 4.7 4.3  CL 100 101 97*  CO2 31 27 28   GLUCOSE 109* 120* 122*  BUN 36* 42* 39*  CREATININE 2.96* 3.35* 2.77*  CALCIUM 8.7* 8.7* 8.6*  MG  --   --  1.9  PHOS  --   --  4.3   GFR: Estimated Creatinine Clearance: 22 mL/min (A) (by C-G formula based on SCr of 2.77 mg/dL (H)). Liver Function Tests: Recent Labs  Lab 02/11/21 0847  AST 33  ALT 27  ALKPHOS 149*  BILITOT 1.0  PROT 6.3*  ALBUMIN 3.2*   No results for input(s): LIPASE, AMYLASE in the last 168  hours. No results for input(s): AMMONIA in the last 168 hours. Coagulation Profile: Recent Labs  Lab 02/11/21 0847  INR 1.1   Cardiac Enzymes: No results for input(s): CKTOTAL, CKMB, CKMBINDEX, TROPONINI in the last 168 hours. BNP (last 3 results) No results for input(s): PROBNP in the last 8760 hours. HbA1C: No results for input(s): HGBA1C in the last 72 hours.  CBG: Recent Labs  Lab 02/13/21 1215 02/13/21 1653 02/13/21 2125 02/14/21 0826 02/14/21 1230  GLUCAP 119* 155* 171* 95 140*   Lipid Profile: No results for input(s): CHOL, HDL, LDLCALC, TRIG, CHOLHDL, LDLDIRECT in the last 72 hours. Thyroid Function Tests: No results for input(s): TSH, T4TOTAL, FREET4, T3FREE, THYROIDAB in the last 72 hours. Anemia Panel: No results for input(s): VITAMINB12, FOLATE, FERRITIN, TIBC, IRON, RETICCTPCT in the last 72 hours. Sepsis Labs: No results for input(s): PROCALCITON, LATICACIDVEN in the last 168 hours.  Recent Results (from the past 240 hour(s))  Resp Panel by RT-PCR (Flu A&B, Covid) Nasopharyngeal Swab     Status: None   Collection Time: 02/11/21  9:45 AM   Specimen: Nasopharyngeal Swab; Nasopharyngeal(NP) swabs in vial transport medium  Result Value Ref Range Status   SARS Coronavirus 2 by RT PCR NEGATIVE NEGATIVE Final    Comment: (NOTE) SARS-CoV-2 target nucleic acids are NOT DETECTED.  The SARS-CoV-2 RNA is generally detectable in upper respiratory specimens during the acute phase of infection. The lowest concentration of SARS-CoV-2 viral copies this assay can detect is 138 copies/mL. A negative result does not preclude SARS-Cov-2 infection and should not be used as the sole basis for treatment or other patient management decisions. A negative result may occur with  improper specimen collection/handling, submission of specimen other than nasopharyngeal swab, presence of viral mutation(s) within the areas targeted by this assay, and inadequate number of  viral copies(<138 copies/mL). A negative result must be combined with clinical observations, patient history, and epidemiological information. The expected result is Negative.  Fact Sheet for Patients:  EntrepreneurPulse.com.au  Fact Sheet for Healthcare Providers:  IncredibleEmployment.be  This test is no t yet approved or cleared by the [place] FDA and  has been authorized for detection and/or diagnosis of SARS-CoV-2 by FDA under an Emergency Use Authorization (EUA). This EUA will remain  in effect (meaning this test can be used) for the duration of the COVID-19 declaration under Section 564(b)(1) of the Act, 21 U.S.C.section 360bbb-3(b)(1), unless the authorization is terminated  or revoked sooner.       Influenza A by PCR NEGATIVE NEGATIVE Final   Influenza B by PCR NEGATIVE NEGATIVE Final    Comment: (NOTE) The Xpert Xpress SARS-CoV-2/FLU/RSV plus assay is intended as an aid in the diagnosis  of influenza from Nasopharyngeal swab specimens and should not be used as a sole basis for treatment. Nasal washings and aspirates are unacceptable for Xpert Xpress SARS-CoV-2/FLU/RSV testing.  Fact Sheet for Patients: EntrepreneurPulse.com.au  Fact Sheet for Healthcare Providers: IncredibleEmployment.be  This test is not yet approved or cleared by the [place] FDA and has been authorized for detection and/or diagnosis of SARS-CoV-2 by FDA under an Emergency Use Authorization (EUA). This EUA will remain in effect (meaning this test can be used) for the duration of the COVID-19 declaration under Section 564(b)(1) of the Act, 21 U.S.C. section 360bbb-3(b)(1), unless the authorization is terminated or revoked.  Performed at [facility], [address] [place], [place] [address]   MRSA Next Gen by PCR, Nasal     Status: None   Collection Time: 02/11/21 10:58 PM   Specimen: Nasal Mucosa;  Nasal Swab  Result Value Ref Range Status   MRSA by PCR Next Gen NOT DETECTED NOT DETECTED Final    Comment: (NOTE) The GeneXpert MRSA Assay (FDA approved for NASAL specimens only), is one component of a comprehensive MRSA colonization surveillance program. It is not intended to diagnose MRSA infection nor to guide or monitor treatment for MRSA infections. Test performance is not FDA approved in patients less than 52 years old. Performed at [facility], [address] [place], [place] [address]     Radiology Studies: No results found.   Scheduled Meds:  amiodarone  200 mg Oral Daily   Chlorhexidine Gluconate Cloth  6 each Topical Q0600   cholecalciferol  1,000 Units Oral Q breakfast   clopidogrel  75 mg Oral Q breakfast   ezetimibe  10 mg Oral QAC breakfast   ferrous sulfate  325 mg Oral BID WC   gabapentin  300 mg Oral QHS   heparin  5,000 Units Subcutaneous Q8H   insulin aspart  0-5 Units Subcutaneous QHS   insulin aspart  0-9 Units Subcutaneous TID WC   levothyroxine  75 mcg Oral QAC breakfast   loratadine  10 mg Oral Daily   midodrine  10 mg Oral QID   multivitamin  1 tablet Oral Daily   polyethylene glycol  17 g Oral Daily   rosuvastatin  40 mg Oral QHS   senna-docusate  2 tablet Oral BID   Continuous Infusions:   LOS: 2 days     Enzo Bi, MD [facility]   If 7PM-7AM, please contact night-coverage

## 2021-02-14 NOTE — Progress Notes (Signed)
Physical Therapy Treatment Patient Details Name: Matthew [name] MRN: [id number] DOB: 12/23/1930 Today's Date: 02/14/2021   History of Present Illness RAMIL EDGINGTON is a 87 y.o. male who was here in early August with fall and R humeral fracture, went to rehab and now here with another fall and pelvic fracture.  Medical history significant for diabetes mellitus with complications of end-stage renal disease on hemodialysis (dialysis days are Tuesday/Thursday/Saturday), coronary artery disease status post CABG, hypertension.    PT Comments    Pt in bed upon arrival, agreeable to session. Pt had not gone to HD today, but is scheduled. minA to EOB. A variety of transfers techniques are taught, most of which requires modA to rise to standing. Pt is very limited with his pivot transfers for a few reasons, some of which are addressed with success, but ultimately he is more successful and safe pivoting toward the left. His RLE is often too laterally displaced from his COM making any potential swingphase of LLE nearly impossible, even with BUE on RW. When cued for centralized Rt foot positioning efforts are easier, moverments better controlled. HR stable throughout. Pt able to  work on gait training in straight plane, educated on use of RW support to facilitate passive swing phase rather than labored SLR swing phase anterior to COM. Pt left at EOB at EOS, family at bedside.   Recommendations for follow up therapy are one component of a multi-disciplinary discharge planning process, led by the attending physician.  Recommendations may be updated based on patient status, additional functional criteria and insurance authorization.  Follow Up Recommendations  SNF;Supervision for mobility/OOB     Equipment Recommendations  Wheelchair (measurements PT);Wheelchair cushion (measurements PT);Rolling walker with 5" wheels    Recommendations for Other Services       Precautions / Restrictions  Precautions Precautions: Fall Restrictions RUE Weight Bearing: Weight bearing as tolerated RLE Weight Bearing: Weight bearing as tolerated     Mobility  Bed Mobility Overal bed mobility: Modified Independent Bed Mobility: Supine to Sit     Supine to sit: Min assist          Transfers Overall transfer level: Needs assistance Equipment used: Rolling walker (2 wheeled) Transfers: Sit to/from [facility] Sit to Stand: Mod assist Stand pivot transfers: Min guard;Supervision       General transfer comment: SPT labored efforts, extensive cues for technique to improve ergonomics  Ambulation/Gait Ambulation/Gait assistance: Min guard (chair follow) Gait Distance (Feet): 5 Feet Assistive device: Rolling walker (2 wheeled) Gait Pattern/deviations: Step-to pattern Gait velocity: very slow and labored, requires >25 steps and 2-3 minutes   General Gait Details: 3-point step-to gait, emphasis on anterior progression of RW for more passive swing phase of each leg from hip extension to neutral.   Stairs             Wheelchair Mobility    Modified Rankin (Stroke Patients Only)       Balance Overall balance assessment: Needs assistance   Sitting balance-Leahy Scale: Good       Standing balance-Leahy Scale: Poor                              Cognition Arousal/Alertness: Awake/alert Behavior During Therapy: WFL for tasks assessed/performed Overall Cognitive Status: Within Functional Limits for tasks assessed  Exercises Other Exercises Other Exercises: STS from EOB c RW modA; STS from recliner c bila tarm push off from chair arms ModA; dependent STS c author modA from chair + pillow, standing transition from author to RW prior to pivot steps Other Exercises: reviewed heel slides, etc; has been doing on RLE independently    General Comments        Pertinent Vitals/Pain Pain  Assessment: Faces Faces Pain Scale: Hurts even more Pain Location: back, lateral pelvis, groin    Home Living                      Prior Function            PT Goals (current goals can now be found in the care plan section) Acute Rehab PT Goals Patient Stated Goal: control pain PT Goal Formulation: With patient Time For Goal Achievement: 02/25/21 Potential to Achieve Goals: Fair Progress towards PT goals: Progressing toward goals    Frequency    Min 2X/week      PT Plan Current plan remains appropriate    Co-evaluation              AM-PAC PT "6 Clicks" Mobility   Outcome Measure  Help needed turning from your back to your side while in a flat bed without using bedrails?: A Lot Help needed moving from lying on your back to sitting on the side of a flat bed without using bedrails?: A Lot Help needed moving to and from a bed to a chair (including a wheelchair)?: A Lot Help needed standing up from a chair using your arms (e.g., wheelchair or bedside chair)?: A Lot Help needed to walk in hospital room?: A Little Help needed climbing 3-5 steps with a railing? : Total 6 Click Score: 12    End of Session   Activity Tolerance: Patient tolerated treatment well;Patient limited by pain;Patient limited by fatigue Patient left: in bed;with call bell/phone within reach;with family/visitor present   PT Visit Diagnosis: Muscle weakness (generalized) (M62.81);Difficulty in walking, not elsewhere classified (R26.2);Pain;Unsteadiness on feet (R26.81) Pain - Right/Left: Right Pain - part of body:  (pelvis)     Time: 8889-1694 PT Time Calculation (min) (ACUTE ONLY): 33 min  Charges:  $Gait Training: 8-22 mins $Therapeutic Exercise: 8-22 mins                    5:06 PM, 02/14/21 Etta Grandchild, PT, DPT Physical Therapist - [facility]  [phone] ([place])      [place]  C 02/14/2021, 5:00 PM

## 2021-02-14 NOTE — Progress Notes (Signed)
[facility]  PROGRESS NOTE   Subjective:   Patient seen at bedside.  Comfortable. Scheduled for dialysis later today.  Objective:  Vital signs in last 24 hours:  Temp:  [97.5 F (36.4 C)-98.7 F (37.1 C)] 97.8 F (36.6 C) (09/24 0739) Pulse Rate:  [64-71] 64 (09/24 0739) Resp:  [16-19] 16 (09/24 0739) BP: (115-134)/(52-58) 119/55 (09/24 0739) SpO2:  [94 %-98 %] 94 % (09/24 0739)  Weight change:  Filed Weights   02/11/21 2150 02/12/21 0607 02/13/21 0550  Weight: 96.6 kg 98.6 kg 94.8 kg    Intake/Output: I/O last 3 completed shifts: In: -  Out: 450 [Urine:450]   Intake/Output this shift:  Total I/O In: -  Out: 150 [Urine:150]  Physical Exam: General:  No acute distress  Head:  Normocephalic, atraumatic. Moist oral mucosal membranes  Eyes:  Anicteric  Neck:  Supple  Lungs:   Clear to auscultation, normal effort  Heart:  S1S2 no rubs  Abdomen:   Soft, nontender, bowel sounds present  Extremities:  peripheral edema.  Neurologic:  Awake, alert, following commands  Skin:  No lesions  Access:     Basic Metabolic Panel: Recent Labs  Lab 02/11/21 0847 02/12/21 0325 02/14/21 0446  NA 140 136 134*  K 4.1 4.7 4.3  CL 100 101 97*  CO2 31 27 28   GLUCOSE 109* 120* 122*  BUN 36* 42* 39*  CREATININE 2.96* 3.35* 2.77*  CALCIUM 8.7* 8.7* 8.6*  MG  --   --  1.9  PHOS  --   --  4.3    CBC: Recent Labs  Lab 02/11/21 0847 02/12/21 0325 02/13/21 0634 02/14/21 0446  WBC 9.0 7.8 6.4 6.4  NEUTROABS 6.6  --   --   --   HGB 9.8* 9.2* 9.5* 9.2*  HCT 29.6* 28.1* 29.2* 28.2*  MCV 96.1 95.9 96.4 95.9  PLT 153 136* 137* 140*     Urinalysis: No results for input(s): COLORURINE, LABSPEC, PHURINE, GLUCOSEU, HGBUR, BILIRUBINUR, KETONESUR, PROTEINUR, UROBILINOGEN, NITRITE, LEUKOCYTESUR in the last 72 hours.  Invalid input(s): APPERANCEUR    Imaging: DG Humerus Right  Result Date: 02/12/2021 CLINICAL DATA:  Follow-up humeral fracture EXAM: RIGHT  HUMERUS - 2+ VIEW COMPARISON:  12/25/2020 FINDINGS: Proximal right humeral fracture is again identified with some slight reduction in the degree of displacement of the fracture fragments. Humeral head is well seated. Increased callus formation is noted consistent with interval healing. No new fracture is seen. Degenerative changes of the acromioclavicular joint are again noted. IMPRESSION: Healing proximal right humeral fracture with some reduction of the fracture fragments. Electronically Signed   By: Inez Catalina M.D.   On: 02/12/2021 16:34     Medications:     amiodarone  200 mg Oral Daily   Chlorhexidine Gluconate Cloth  6 each Topical Q0600   cholecalciferol  1,000 Units Oral Q breakfast   clopidogrel  75 mg Oral Q breakfast   ezetimibe  10 mg Oral QAC breakfast   ferrous sulfate  325 mg Oral BID WC   gabapentin  300 mg Oral QHS   heparin  5,000 Units Subcutaneous Q8H   insulin aspart  0-5 Units Subcutaneous QHS   insulin aspart  0-9 Units Subcutaneous TID WC   levothyroxine  75 mcg Oral QAC breakfast   loratadine  10 mg Oral Daily   midodrine  10 mg Oral QID   multivitamin  1 tablet Oral Daily   polyethylene glycol  17 g Oral Daily   rosuvastatin  40 mg Oral QHS   senna-docusate  2 tablet Oral BID    Assessment/ Plan:     Active Problems:   Pelvic fracture [facility])  87 year old male with history of diabetes, hypertension, coronary artery disease, congestive heart failure, CABG, ESRD on hemodialysis Saturday schedule.  He is now admitted with history of fall and right hip fracture.  Patient also found to have acute zoster.  #1: ESRD: Patient has been on TTS schedule for dialysis.  We will dialyze today.  #2: Anemia: Anemia secondary to chronic kidney disease.  We will continue the protocol.  #3: Right hip fracture: Opted for medical management.  #4: Herpes zoster: We will continue the acyclovir.  Other medications reviewed.  We will continue to follow.   LOS: [place], [facility] 9/24/20221:30 PM

## 2021-02-15 DIAGNOSIS — L8990 Pressure ulcer of unspecified site, unspecified stage: Secondary | ICD-10-CM | POA: Insufficient documentation

## 2021-02-15 DIAGNOSIS — S32511A Fracture of superior rim of right pubis, initial encounter for closed fracture: Secondary | ICD-10-CM | POA: Diagnosis not present

## 2021-02-15 LAB — GLUCOSE, CAPILLARY
Glucose-Capillary: 105 mg/dL — ABNORMAL HIGH (ref 70–99)
Glucose-Capillary: 177 mg/dL — ABNORMAL HIGH (ref 70–99)
Glucose-Capillary: 141 mg/dL — ABNORMAL HIGH (ref 70–99)
Glucose-Capillary: 153 mg/dL — ABNORMAL HIGH (ref 70–99)

## 2021-02-15 LAB — BASIC METABOLIC PANEL
Anion gap: 11 (ref 5–15)
BUN: 22 mg/dL (ref 8–23)
CO2: 29 mmol/L (ref 22–32)
Calcium: 8.6 mg/dL — ABNORMAL LOW (ref 8.9–10.3)
Chloride: 96 mmol/L — ABNORMAL LOW (ref 98–111)
Creatinine, Ser: 2.09 mg/dL — ABNORMAL HIGH (ref 0.61–1.24)
GFR, Estimated: 30 mL/min — ABNORMAL LOW (ref >60)
Glucose, Bld: 111 mg/dL — ABNORMAL HIGH (ref 70–99)
Potassium: 4.1 mmol/L (ref 3.5–5.1)
Sodium: 136 mmol/L (ref 135–145)

## 2021-02-15 LAB — CBC
HCT: 27.4 % — ABNORMAL LOW (ref 39.0–52.0)
Hemoglobin: 9.1 g/dL — ABNORMAL LOW (ref 13.0–17.0)
MCH: 31.7 pg (ref 26.0–34.0)
MCHC: 33.2 g/dL (ref 30.0–36.0)
MCV: 95.5 fL (ref 80.0–100.0)
Platelets: 147 10*3/uL — ABNORMAL LOW (ref 150–400)
RBC: 2.87 MIL/uL — ABNORMAL LOW (ref 4.22–5.81)
RDW: 14.2 % (ref 11.5–15.5)
WBC: 5.7 10*3/uL (ref 4.0–10.5)
nRBC: 0 % (ref 0.0–0.2)

## 2021-02-15 LAB — MAGNESIUM: Magnesium: 1.8 mg/dL (ref 1.7–2.4)

## 2021-02-15 MED ORDER — MIDODRINE HCL 5 MG PO TABS
5.0000 mg | ORAL_TABLET | Freq: Three times a day (TID) | ORAL | Status: DC
  Administered 2021-02-15 – 2021-02-17 (×7): 5 mg via ORAL
  Filled 2021-02-15 (×7): qty 1

## 2021-02-15 MED ORDER — MIDODRINE HCL 5 MG PO TABS
5.0000 mg | ORAL_TABLET | Freq: Three times a day (TID) | ORAL | Status: DC
  Administered 2021-02-15: 5 mg via ORAL

## 2021-02-15 NOTE — Progress Notes (Signed)
[facility]  PROGRESS NOTE   Subjective:   Patient was dialyzed last night. Received midodrine and was able to tolerate a fluid removal of 727 cc.  Objective:  Vital signs in last 24 hours:  Temp:  [97.6 F (36.4 C)-98.8 F (37.1 C)] 98 F (36.7 C) (09/25 1126) Pulse Rate:  [63-74] 71 (09/25 1126) Resp:  [12-23] 16 (09/25 1126) BP: (98-147)/(54-119) 115/54 (09/25 1126) SpO2:  [97 %-100 %] 100 % (09/25 1126)  Weight change:  Filed Weights   02/11/21 2150 02/12/21 0607 02/13/21 0550  Weight: 96.6 kg 98.6 kg 94.8 kg    Intake/Output: I/O last 3 completed shifts: In: -  Out: 877 [Urine:150; Other:727]   Intake/Output this shift:  Total I/O In: -  Out: 250 [Urine:250]  Physical Exam: General:  No acute distress  Head:  Normocephalic, atraumatic. Moist oral mucosal membranes  Eyes:  Anicteric  Neck:  Supple  Lungs:   Clear to auscultation, normal effort  Heart:  S1S2 no rubs  Abdomen:   Soft, nontender, bowel sounds present  Extremities:  peripheral edema.  Neurologic:  Awake, alert, following commands  Skin:  No lesions  Access:     Basic Metabolic Panel: Recent Labs  Lab 02/11/21 0847 02/12/21 0325 02/14/21 0446 02/15/21 0546  NA 140 136 134* 136  K 4.1 4.7 4.3 4.1  CL 100 101 97* 96*  CO2 31 27 28 29   GLUCOSE 109* 120* 122* 111*  BUN 36* 42* 39* 22  CREATININE 2.96* 3.35* 2.77* 2.09*  CALCIUM 8.7* 8.7* 8.6* 8.6*  MG  --   --  1.9 1.8  PHOS  --   --  4.3  --     CBC: Recent Labs  Lab 02/11/21 0847 02/12/21 0325 02/13/21 0634 02/14/21 0446 02/15/21 0546  WBC 9.0 7.8 6.4 6.4 5.7  NEUTROABS 6.6  --   --   --   --   HGB 9.8* 9.2* 9.5* 9.2* 9.1*  HCT 29.6* 28.1* 29.2* 28.2* 27.4*  MCV 96.1 95.9 96.4 95.9 95.5  PLT 153 136* 137* 140* 147*     Urinalysis: No results for input(s): COLORURINE, LABSPEC, PHURINE, GLUCOSEU, HGBUR, BILIRUBINUR, KETONESUR, PROTEINUR, UROBILINOGEN, NITRITE, LEUKOCYTESUR in the last 72 hours.  Invalid  input(s): APPERANCEUR    Imaging: No results found.   Medications:     amiodarone  200 mg Oral Daily   Chlorhexidine Gluconate Cloth  6 each Topical Q0600   cholecalciferol  1,000 Units Oral Q breakfast   clopidogrel  75 mg Oral Q breakfast   ezetimibe  10 mg Oral QAC breakfast   ferrous sulfate  325 mg Oral BID WC   gabapentin  300 mg Oral QHS   heparin  5,000 Units Subcutaneous Q8H   levothyroxine  75 mcg Oral QAC breakfast   loratadine  10 mg Oral Daily   midodrine  5 mg Oral TID WC   multivitamin  1 tablet Oral Daily   polyethylene glycol  17 g Oral Daily   rosuvastatin  40 mg Oral QHS   senna-docusate  2 tablet Oral BID    Assessment/ Plan:     Active Problems:   Pelvic fracture [facility])  87 year old male with history of diabetes, hypertension, coronary artery disease, congestive heart failure, CABG, ESRD on hemodialysis Saturday schedule.  He is now admitted with history of fall and right hip fracture.  Patient also found to have acute zoster.   #1: ESRD: Patient has been on TTS schedule for dialysis.  Was dialyzed yesterday.   #2: Anemia: Anemia secondary to chronic kidney disease.  We will continue the protocol.   #3: Right hip fracture: Opted for medical management.   #4: Herpes zoster: We will continue the acyclovir.   Other medications reviewed.  We will continue to follow.   LOS: [place], [facility] 9/25/20222:50 PM

## 2021-02-15 NOTE — Progress Notes (Signed)
PROGRESS NOTE    Matthew [name]  GMW:[id number] DOB: 1930/12/09 DOA: 02/11/2021 PCP: Jodi Marble, MD   Brief Narrative:  This 87 y.o. male with PMH significant for DM type II, ESRD on hemodialysis (Tuesday/Thursday/Saturday), CAD s/p CABG, hypertension, hypothyroidism, CHF presented from home to the ED on 02/11/2021 status post fall with Right hip fracture. In the ED,  He was found to have acute right superior and inferior rami fx. it was treated with pain control.  Orthopedics was consulted recommended nonoperative management.  Advised weightbearing as tolerated.  Patient is awaiting SNF placement.  Assessment & Plan:   Active Problems:   Pelvic fracture (HCC)  Right pubic ramus fracture: Orthopedic consulted,  recommended nonoperative management. Plan: --Weight-bear as tolerated for right lower extremity. --Follow-up orthopedics in 2 to 3 weeks.    Recent right humeral fracture: X-ray right humerus : Healing proximal right humeral fracture with some reduction of the fracture fragments. --Okay for use of walker with right upper extremity  ESRD on hemodialysis (TTS): --cont iHD per nephrology  CAD s/p CABG --cont Plavix, statin, Zetia  Chronic combined CHF --stable.  History of shingles with recent flare: Continue acyclovir  Hypothyroidism Continue Synthroid  Type 2 diabetes Last hemoglobin A1c 6.3. --BG have been within inpatient goal --no need for BG checks and SSI  Anemia of chronic kidney disease: Hemoglobin remained stable at 9.2  History of orthostatic hypotension: --BP improving --decrease midodrine today from 10 mg QID to 5 mg TID  Stage 2 pressure ulcer on right buttock, POA   DVT prophylaxis: heparin sq Code Status: DNR Family Communication: Disposition Plan:    Status is: Inpatient  Dispo: The patient is from: Home              Anticipated d/c is to: SNF              Patient currently is medically stable to d/c.   Difficult to place  patient No   Consultants:  Orthopedics  Procedures: Nonoperative management. Antimicrobials:   Anti-infectives (From admission, onward)    None       Subjective: Normal oral intake.  Pt and RN both mentioned a pressure ulcer on his buttock.     Objective: Vitals:   02/14/21 2328 02/15/21 0727 02/15/21 1126 02/15/21 1500  BP: (!) 131/58 129/61 (!) 115/54 134/69  Pulse: 69 72 71 66  Resp: 17 16 16 18   Temp: 98.7 F (37.1 C) 98.4 F (36.9 C) 98 F (36.7 C) 98 F (36.7 C)  TempSrc:   Oral Oral  SpO2: 97% 98% 100% 97%  Weight:      Height:        Intake/Output Summary (Last 24 hours) at 02/15/2021 1523 Last data filed at 02/15/2021 1204 Gross per 24 hour  Intake --  Output 977 ml  Net -977 ml   Filed Weights   02/11/21 2150 02/12/21 0607 02/13/21 0550  Weight: 96.6 kg 98.6 kg 94.8 kg    Examination:  Constitutional: NAD, AAOx3, sitting in recliner HEENT: conjunctivae and lids normal, EOMI CV: No cyanosis.   RESP: normal respiratory effort, on RA Extremities: No effusions, edema in BLE SKIN: warm, dry Neuro: II - XII grossly intact.   Psych: Normal mood and affect.  Appropriate judgement and reason   Data Reviewed: I have personally reviewed following labs and imaging studies  CBC: Recent Labs  Lab 02/11/21 0847 02/12/21 0325 02/13/21 0634 02/14/21 0446 02/15/21 0546  WBC 9.0 7.8 6.4 6.4 5.7  NEUTROABS 6.6  --   --   --   --   HGB 9.8* 9.2* 9.5* 9.2* 9.1*  HCT 29.6* 28.1* 29.2* 28.2* 27.4*  MCV 96.1 95.9 96.4 95.9 95.5  PLT 153 136* 137* 140* 734*   Basic Metabolic Panel: Recent Labs  Lab 02/11/21 0847 02/12/21 0325 02/14/21 0446 02/15/21 0546  NA 140 136 134* 136  K 4.1 4.7 4.3 4.1  CL 100 101 97* 96*  CO2 31 27 28 29   GLUCOSE 109* 120* 122* 111*  BUN 36* 42* 39* 22  CREATININE 2.96* 3.35* 2.77* 2.09*  CALCIUM 8.7* 8.7* 8.6* 8.6*  MG  --   --  1.9 1.8  PHOS  --   --  4.3  --    GFR: Estimated Creatinine Clearance: 29.2 mL/min (A)  (by C-G formula based on SCr of 2.09 mg/dL (H)). Liver Function Tests: Recent Labs  Lab 02/11/21 0847  AST 33  ALT 27  ALKPHOS 149*  BILITOT 1.0  PROT 6.3*  ALBUMIN 3.2*   No results for input(s): LIPASE, AMYLASE in the last 168 hours. No results for input(s): AMMONIA in the last 168 hours. Coagulation Profile: Recent Labs  Lab 02/11/21 0847  INR 1.1   Cardiac Enzymes: No results for input(s): CKTOTAL, CKMB, CKMBINDEX, TROPONINI in the last 168 hours. BNP (last 3 results) No results for input(s): PROBNP in the last 8760 hours. HbA1C: No results for input(s): HGBA1C in the last 72 hours.  CBG: Recent Labs  Lab 02/14/21 1230 02/14/21 1625 02/14/21 2147 02/15/21 0729 02/15/21 1200  GLUCAP 140* 140* 135* 105* 177*   Lipid Profile: No results for input(s): CHOL, HDL, LDLCALC, TRIG, CHOLHDL, LDLDIRECT in the last 72 hours. Thyroid Function Tests: No results for input(s): TSH, T4TOTAL, FREET4, T3FREE, THYROIDAB in the last 72 hours. Anemia Panel: No results for input(s): VITAMINB12, FOLATE, FERRITIN, TIBC, IRON, RETICCTPCT in the last 72 hours. Sepsis Labs: No results for input(s): PROCALCITON, LATICACIDVEN in the last 168 hours.  Recent Results (from the past 240 hour(s))  Resp Panel by RT-PCR (Flu A&B, Covid) Nasopharyngeal Swab     Status: None   Collection Time: 02/11/21  9:45 AM   Specimen: Nasopharyngeal Swab; Nasopharyngeal(NP) swabs in vial transport medium  Result Value Ref Range Status   SARS Coronavirus 2 by RT PCR NEGATIVE NEGATIVE Final    Comment: (NOTE) SARS-CoV-2 target nucleic acids are NOT DETECTED.  The SARS-CoV-2 RNA is generally detectable in upper respiratory specimens during the acute phase of infection. The lowest concentration of SARS-CoV-2 viral copies this assay can detect is 138 copies/mL. A negative result does not preclude SARS-Cov-2 infection and should not be used as the sole basis for treatment or other patient management  decisions. A negative result may occur with  improper specimen collection/handling, submission of specimen other than nasopharyngeal swab, presence of viral mutation(s) within the areas targeted by this assay, and inadequate number of viral copies(<138 copies/mL). A negative result must be combined with clinical observations, patient history, and epidemiological information. The expected result is Negative.  Fact Sheet for Patients:  EntrepreneurPulse.com.au  Fact Sheet for Healthcare Providers:  IncredibleEmployment.be  This test is no t yet approved or cleared by the [place] FDA and  has been authorized for detection and/or diagnosis of SARS-CoV-2 by FDA under an Emergency Use Authorization (EUA). This EUA will remain  in effect (meaning this test can be used) for the duration of the COVID-19 declaration under Section 564(b)(1) of the Act, 21 U.S.C.section  360bbb-3(b)(1), unless the authorization is terminated  or revoked sooner.       Influenza A by PCR NEGATIVE NEGATIVE Final   Influenza B by PCR NEGATIVE NEGATIVE Final    Comment: (NOTE) The Xpert Xpress SARS-CoV-2/FLU/RSV plus assay is intended as an aid in the diagnosis of influenza from Nasopharyngeal swab specimens and should not be used as a sole basis for treatment. Nasal washings and aspirates are unacceptable for Xpert Xpress SARS-CoV-2/FLU/RSV testing.  Fact Sheet for Patients: EntrepreneurPulse.com.au  Fact Sheet for Healthcare Providers: IncredibleEmployment.be  This test is not yet approved or cleared by the [place] FDA and has been authorized for detection and/or diagnosis of SARS-CoV-2 by FDA under an Emergency Use Authorization (EUA). This EUA will remain in effect (meaning this test can be used) for the duration of the COVID-19 declaration under Section 564(b)(1) of the Act, 21 U.S.C. section 360bbb-3(b)(1), unless the  authorization is terminated or revoked.  Performed at [facility], [address] [place], [place] [address]   MRSA Next Gen by PCR, Nasal     Status: None   Collection Time: 02/11/21 10:58 PM   Specimen: Nasal Mucosa; Nasal Swab  Result Value Ref Range Status   MRSA by PCR Next Gen NOT DETECTED NOT DETECTED Final    Comment: (NOTE) The GeneXpert MRSA Assay (FDA approved for NASAL specimens only), is one component of a comprehensive MRSA colonization surveillance program. It is not intended to diagnose MRSA infection nor to guide or monitor treatment for MRSA infections. Test performance is not FDA approved in patients less than 30 years old. Performed at [facility], [address], [place], [place] [address]     Radiology Studies: No results found.   Scheduled Meds:  amiodarone  200 mg Oral Daily   Chlorhexidine Gluconate Cloth  6 each Topical Q0600   cholecalciferol  1,000 Units Oral Q breakfast   clopidogrel  75 mg Oral Q breakfast   ezetimibe  10 mg Oral QAC breakfast   ferrous sulfate  325 mg Oral BID WC   gabapentin  300 mg Oral QHS   heparin  5,000 Units Subcutaneous Q8H   levothyroxine  75 mcg Oral QAC breakfast   loratadine  10 mg Oral Daily   midodrine  5 mg Oral TID WC   multivitamin  1 tablet Oral Daily   polyethylene glycol  17 g Oral Daily   rosuvastatin  40 mg Oral QHS   senna-docusate  2 tablet Oral BID   Continuous Infusions:   LOS: 3 days     Enzo Bi, MD [facility]   If 7PM-7AM, please contact night-coverage

## 2021-02-15 NOTE — TOC Progression Note (Signed)
Transition of Care [facility]) - Progression Note    Patient Details  Name: Matthew [name] MRN: [id number] Date of Birth: 05-11-1931  Transition of [facility]) CM/SW Contact  Su Hilt, RN Phone Number: 02/15/2021, 8:24 AM  Clinical Narrative:    Called the [facility] in [place], left a voice mail requesting a bed offer. Awaiting a response    Expected Discharge Plan: [place] Barriers to Discharge: Continued Medical Work up  Expected Discharge Plan and Services Expected Discharge Plan: [place]                                               Social Determinants of Health (SDOH) Interventions    Readmission Risk Interventions Readmission Risk Prevention Plan 02/12/2021  Transportation Screening Complete  Medication Review [employer]) Complete  SW Recovery Care/Counseling Consult Complete  Skilled Nursing Facility Complete  Some recent data might be hidden

## 2021-02-16 DIAGNOSIS — S32511A Fracture of superior rim of right pubis, initial encounter for closed fracture: Secondary | ICD-10-CM | POA: Diagnosis not present

## 2021-02-16 DIAGNOSIS — B0229 Other postherpetic nervous system involvement: Secondary | ICD-10-CM

## 2021-02-16 LAB — BASIC METABOLIC PANEL
Anion gap: 9 (ref 5–15)
BUN: 32 mg/dL — ABNORMAL HIGH (ref 8–23)
CO2: 29 mmol/L (ref 22–32)
Calcium: 8.7 mg/dL — ABNORMAL LOW (ref 8.9–10.3)
Chloride: 98 mmol/L (ref 98–111)
Creatinine, Ser: 2.65 mg/dL — ABNORMAL HIGH (ref 0.61–1.24)
GFR, Estimated: 22 mL/min — ABNORMAL LOW (ref >60)
Glucose, Bld: 122 mg/dL — ABNORMAL HIGH (ref 70–99)
Potassium: 3.9 mmol/L (ref 3.5–5.1)
Sodium: 136 mmol/L (ref 135–145)

## 2021-02-16 LAB — CBC
HCT: 28 % — ABNORMAL LOW (ref 39.0–52.0)
Hemoglobin: 9.2 g/dL — ABNORMAL LOW (ref 13.0–17.0)
MCH: 31.4 pg (ref 26.0–34.0)
MCHC: 32.9 g/dL (ref 30.0–36.0)
MCV: 95.6 fL (ref 80.0–100.0)
Platelets: 149 10*3/uL — ABNORMAL LOW (ref 150–400)
RBC: 2.93 MIL/uL — ABNORMAL LOW (ref 4.22–5.81)
RDW: 14.5 % (ref 11.5–15.5)
WBC: 5.2 10*3/uL (ref 4.0–10.5)
nRBC: 0 % (ref 0.0–0.2)

## 2021-02-16 LAB — GLUCOSE, CAPILLARY
Glucose-Capillary: 113 mg/dL — ABNORMAL HIGH (ref 70–99)
Glucose-Capillary: 192 mg/dL — ABNORMAL HIGH (ref 70–99)
Glucose-Capillary: 164 mg/dL — ABNORMAL HIGH (ref 70–99)
Glucose-Capillary: 169 mg/dL — ABNORMAL HIGH (ref 70–99)

## 2021-02-16 LAB — MAGNESIUM: Magnesium: 1.9 mg/dL (ref 1.7–2.4)

## 2021-02-16 LAB — SARS CORONAVIRUS 2 (TAT 6-24 HRS): SARS Coronavirus 2: NEGATIVE

## 2021-02-16 NOTE — Progress Notes (Signed)
PROGRESS NOTE    Matthew [name]  QIH:[id number] DOB: 11/17/1930 DOA: 02/11/2021 PCP: Jodi Marble, MD   Brief Narrative:  This 87 y.o. male with PMH significant for DM type II, ESRD on hemodialysis (Tuesday/Thursday/Saturday), CAD s/p CABG, hypertension, hypothyroidism, CHF presented from home to the ED on 02/11/2021 status post fall with Right hip fracture. In the ED,  He was found to have acute right superior and inferior rami fx. it was treated with pain control.  Orthopedics was consulted recommended nonoperative management.  Advised weightbearing as tolerated.  Patient is awaiting SNF placement tomorrow  Assessment & Plan:   Active Problems:   Pelvic fracture (HCC)   Pressure injury of skin  Right pubic ramus fracture: Orthopedic recommend conservative mgmt --Weight-bear as tolerated for right lower extremity. --Follow-up orthopedics in 2 to 3 weeks.    Recent right humeral fracture: X-ray right humerus : Healing proximal right humeral fracture with some reduction of the fracture fragments. --Okay for use of walker with right upper extremity  ESRD on hemodialysis (TTS): --cont iHD per nephrology  CAD s/p CABG --cont Plavix, statin, Zetia  Chronic combined CHF --stable.  History of shingles with recent flare: Continue acyclovir ointment and neurontin for post herpetic neuralgia.  - Still has lesion on right chest although doesn't look vesicular. They're tender per patient  - will get ID input to decide isolation course and if any treatment need  Hypothyroidism Continue Synthroid  Type 2 diabetes Last hemoglobin A1c 6.3. --BG have been within inpatient goal --no need for BG checks and SSI  Anemia of chronic kidney disease: Hemoglobin remained stable at 9.2  History of orthostatic hypotension: --BP improving --midodrine 5 mg TID  Stage 2 pressure ulcer on right buttock, POA Dressing change per [place].  DVT prophylaxis: heparin sq Code Status:  DNR Family Communication: Disposition Plan:  SNF tomorrow (facility didn't have isolation bed available today).  Status is: Inpatient  Dispo: The patient is from: Home              Anticipated d/c is to: SNF              Patient currently is medically stable to d/c.   Difficult to place patient No   Consultants:  Orthopedics ID  Procedures: Nonoperative management. Antimicrobials:   Anti-infectives (From admission, onward)    None       Subjective: No new c/o,. Very pleasant   Objective: Vitals:   02/15/21 1500 02/15/21 2105 02/16/21 0529 02/16/21 0743  BP: 134/69 (!) 132/59 140/60 (!) 128/55  Pulse: 66 60 70 65  Resp: 18 20 20 18   Temp: 98 F (36.7 C) 97.6 F (36.4 C) (!) 97.5 F (36.4 C) 97.9 F (36.6 C)  TempSrc: Oral Oral Oral   SpO2: 97% 96% 95% 98%  Weight:      Height:        Intake/Output Summary (Last 24 hours) at 02/16/2021 1454 Last data filed at 02/16/2021 0549 Gross per 24 hour  Intake --  Output 600 ml  Net -600 ml   Filed Weights   02/11/21 2150 02/12/21 0607 02/13/21 0550  Weight: 96.6 kg 98.6 kg 94.8 kg    Examination:  Constitutional: NAD, AAOx3, sitting in recliner HEENT: conjunctivae and lids normal, EOMI CV: No cyanosis. Red dermatomal distribution rash on upper chest RESP: normal respiratory effort, on RA Extremities: No effusions, edema in BLE SKIN: warm, dry Neuro: II - XII grossly intact.   Psych: Normal mood and  affect.  Appropriate judgement and reason   Data Reviewed: I have personally reviewed following labs and imaging studies  CBC: Recent Labs  Lab 02/11/21 0847 02/12/21 0325 02/13/21 0634 02/14/21 0446 02/15/21 0546 02/16/21 0412  WBC 9.0 7.8 6.4 6.4 5.7 5.2  NEUTROABS 6.6  --   --   --   --   --   HGB 9.8* 9.2* 9.5* 9.2* 9.1* 9.2*  HCT 29.6* 28.1* 29.2* 28.2* 27.4* 28.0*  MCV 96.1 95.9 96.4 95.9 95.5 95.6  PLT 153 136* 137* 140* 147* 662*   Basic Metabolic Panel: Recent Labs  Lab 02/11/21 0847  02/12/21 0325 02/14/21 0446 02/15/21 0546 02/16/21 0412  NA 140 136 134* 136 136  K 4.1 4.7 4.3 4.1 3.9  CL 100 101 97* 96* 98  CO2 31 27 28 29 29   GLUCOSE 109* 120* 122* 111* 122*  BUN 36* 42* 39* 22 32*  CREATININE 2.96* 3.35* 2.77* 2.09* 2.65*  CALCIUM 8.7* 8.7* 8.6* 8.6* 8.7*  MG  --   --  1.9 1.8 1.9  PHOS  --   --  4.3  --   --    GFR: Estimated Creatinine Clearance: 23 mL/min (A) (by C-G formula based on SCr of 2.65 mg/dL (H)). Liver Function Tests: Recent Labs  Lab 02/11/21 0847  AST 33  ALT 27  ALKPHOS 149*  BILITOT 1.0  PROT 6.3*  ALBUMIN 3.2*   No results for input(s): LIPASE, AMYLASE in the last 168 hours. No results for input(s): AMMONIA in the last 168 hours. Coagulation Profile: Recent Labs  Lab 02/11/21 0847  INR 1.1   Cardiac Enzymes: No results for input(s): CKTOTAL, CKMB, CKMBINDEX, TROPONINI in the last 168 hours. BNP (last 3 results) No results for input(s): PROBNP in the last 8760 hours. HbA1C: No results for input(s): HGBA1C in the last 72 hours.  CBG: Recent Labs  Lab 02/15/21 1200 02/15/21 1636 02/15/21 2102 02/16/21 0812 02/16/21 1204  GLUCAP 177* 141* 153* 113* 192*   Lipid Profile: No results for input(s): CHOL, HDL, LDLCALC, TRIG, CHOLHDL, LDLDIRECT in the last 72 hours. Thyroid Function Tests: No results for input(s): TSH, T4TOTAL, FREET4, T3FREE, THYROIDAB in the last 72 hours. Anemia Panel: No results for input(s): VITAMINB12, FOLATE, FERRITIN, TIBC, IRON, RETICCTPCT in the last 72 hours. Sepsis Labs: No results for input(s): PROCALCITON, LATICACIDVEN in the last 168 hours.  Recent Results (from the past 240 hour(s))  Resp Panel by RT-PCR (Flu A&B, Covid) Nasopharyngeal Swab     Status: None   Collection Time: 02/11/21  9:45 AM   Specimen: Nasopharyngeal Swab; Nasopharyngeal(NP) swabs in vial transport medium  Result Value Ref Range Status   SARS Coronavirus 2 by RT PCR NEGATIVE NEGATIVE Final    Comment:  (NOTE) SARS-CoV-2 target nucleic acids are NOT DETECTED.  The SARS-CoV-2 RNA is generally detectable in upper respiratory specimens during the acute phase of infection. The lowest concentration of SARS-CoV-2 viral copies this assay can detect is 138 copies/mL. A negative result does not preclude SARS-Cov-2 infection and should not be used as the sole basis for treatment or other patient management decisions. A negative result may occur with  improper specimen collection/handling, submission of specimen other than nasopharyngeal swab, presence of viral mutation(s) within the areas targeted by this assay, and inadequate number of viral copies(<138 copies/mL). A negative result must be combined with clinical observations, patient history, and epidemiological information. The expected result is Negative.  Fact Sheet for Patients:  EntrepreneurPulse.com.au  Fact Sheet  for Healthcare Providers:  IncredibleEmployment.be  This test is no t yet approved or cleared by the [place] and  has been authorized for detection and/or diagnosis of SARS-CoV-2 by FDA under an Emergency Use Authorization (EUA). This EUA will remain  in effect (meaning this test can be used) for the duration of the COVID-19 declaration under Section 564(b)(1) of the Act, 21 U.S.C.section 360bbb-3(b)(1), unless the authorization is terminated  or revoked sooner.       Influenza A by PCR NEGATIVE NEGATIVE Final   Influenza B by PCR NEGATIVE NEGATIVE Final    Comment: (NOTE) The Xpert Xpress SARS-CoV-2/FLU/RSV plus assay is intended as an aid in the diagnosis of influenza from Nasopharyngeal swab specimens and should not be used as a sole basis for treatment. Nasal washings and aspirates are unacceptable for Xpert Xpress SARS-CoV-2/FLU/RSV testing.  Fact Sheet for Patients: EntrepreneurPulse.com.au  Fact Sheet for Healthcare  Providers: IncredibleEmployment.be  This test is not yet approved or cleared by the [place] FDA and has been authorized for detection and/or diagnosis of SARS-CoV-2 by FDA under an Emergency Use Authorization (EUA). This EUA will remain in effect (meaning this test can be used) for the duration of the COVID-19 declaration under Section 564(b)(1) of the Act, 21 U.S.C. section 360bbb-3(b)(1), unless the authorization is terminated or revoked.  Performed at [facility], [address] [place], [place] [address]   MRSA Next Gen by PCR, Nasal     Status: None   Collection Time: 02/11/21 10:58 PM   Specimen: Nasal Mucosa; Nasal Swab  Result Value Ref Range Status   MRSA by PCR Next Gen NOT DETECTED NOT DETECTED Final    Comment: (NOTE) The GeneXpert MRSA Assay (FDA approved for NASAL specimens only), is one component of a comprehensive MRSA colonization surveillance program. It is not intended to diagnose MRSA infection nor to guide or monitor treatment for MRSA infections. Test performance is not FDA approved in patients less than 15 years old. Performed at [facility], [address], [place], [place] [address]     Radiology Studies: No results found.   Scheduled Meds:  amiodarone  200 mg Oral Daily   Chlorhexidine Gluconate Cloth  6 each Topical Q0600   cholecalciferol  1,000 Units Oral Q breakfast   clopidogrel  75 mg Oral Q breakfast   ezetimibe  10 mg Oral QAC breakfast   ferrous sulfate  325 mg Oral BID WC   gabapentin  300 mg Oral QHS   heparin  5,000 Units Subcutaneous Q8H   levothyroxine  75 mcg Oral QAC breakfast   loratadine  10 mg Oral Daily   midodrine  5 mg Oral TID WC   multivitamin  1 tablet Oral Daily   polyethylene glycol  17 g Oral Daily   rosuvastatin  40 mg Oral QHS   senna-docusate  2 tablet Oral BID   Continuous Infusions:   LOS: 4 days     Max Sane, MD [facility]   If 7PM-7AM,  please contact night-coverage

## 2021-02-16 NOTE — Progress Notes (Signed)
[facility]  PROGRESS NOTE   Subjective:   Patient seen sitting on side of bed with PT States he feels well today Tolerating meals Denies shortness of breath  Objective:  Vital signs in last 24 hours:  Temp:  [97.5 F (36.4 C)-98 F (36.7 C)] 97.9 F (36.6 C) (09/26 0743) Pulse Rate:  [60-71] 65 (09/26 0743) Resp:  [16-20] 18 (09/26 0743) BP: (115-140)/(54-69) 128/55 (09/26 0743) SpO2:  [95 %-100 %] 98 % (09/26 0743)  Weight change:  Filed Weights   02/11/21 2150 02/12/21 0607 02/13/21 0550  Weight: 96.6 kg 98.6 kg 94.8 kg    Intake/Output: I/O last 3 completed shifts: In: -  Out: 1577 [Urine:850; Other:727]   Intake/Output this shift:  No intake/output data recorded.  Physical Exam: General:  No acute distress  Head:  Normocephalic, atraumatic. Moist oral mucosal membranes  Eyes:  Anicteric  Lungs:   Clear to auscultation, normal effort  Heart:  S1S2 no rubs  Abdomen:   Soft, nontender, bowel sounds present  Extremities:  No peripheral edema.  Neurologic:  Awake, alert, following commands  Skin:  Red, macular rash right breast to back  Access: Rt Permcath     Basic Metabolic Panel: Recent Labs  Lab 02/11/21 0847 02/12/21 0325 02/14/21 0446 02/15/21 0546 02/16/21 0412  NA 140 136 134* 136 136  K 4.1 4.7 4.3 4.1 3.9  CL 100 101 97* 96* 98  CO2 31 27 28 29 29   GLUCOSE 109* 120* 122* 111* 122*  BUN 36* 42* 39* 22 32*  CREATININE 2.96* 3.35* 2.77* 2.09* 2.65*  CALCIUM 8.7* 8.7* 8.6* 8.6* 8.7*  MG  --   --  1.9 1.8 1.9  PHOS  --   --  4.3  --   --      CBC: Recent Labs  Lab 02/11/21 0847 02/12/21 0325 02/13/21 0634 02/14/21 0446 02/15/21 0546 02/16/21 0412  WBC 9.0 7.8 6.4 6.4 5.7 5.2  NEUTROABS 6.6  --   --   --   --   --   HGB 9.8* 9.2* 9.5* 9.2* 9.1* 9.2*  HCT 29.6* 28.1* 29.2* 28.2* 27.4* 28.0*  MCV 96.1 95.9 96.4 95.9 95.5 95.6  PLT 153 136* 137* 140* 147* 149*      Urinalysis: No results for input(s): COLORURINE,  LABSPEC, PHURINE, GLUCOSEU, HGBUR, BILIRUBINUR, KETONESUR, PROTEINUR, UROBILINOGEN, NITRITE, LEUKOCYTESUR in the last 72 hours.  Invalid input(s): APPERANCEUR    Imaging: No results found.   Medications:     amiodarone  200 mg Oral Daily   Chlorhexidine Gluconate Cloth  6 each Topical Q0600   cholecalciferol  1,000 Units Oral Q breakfast   clopidogrel  75 mg Oral Q breakfast   ezetimibe  10 mg Oral QAC breakfast   ferrous sulfate  325 mg Oral BID WC   gabapentin  300 mg Oral QHS   heparin  5,000 Units Subcutaneous Q8H   levothyroxine  75 mcg Oral QAC breakfast   loratadine  10 mg Oral Daily   midodrine  5 mg Oral TID WC   multivitamin  1 tablet Oral Daily   polyethylene glycol  17 g Oral Daily   rosuvastatin  40 mg Oral QHS   senna-docusate  2 tablet Oral BID    Assessment/ Plan:     87 year old male with history of diabetes, hypertension, coronary artery disease, congestive heart failure, CABG, ESRD on hemodialysis Saturday schedule.  He is now admitted with history of fall and right hip fracture.  Patient also found to have acute zoster.  Active Problems:   Pelvic fracture (HCC)   Pressure injury of skin    #1: ESRD: Patient has been on TTS schedule for dialysis.  Next treatment scheduled for Tuesday. Pending rehab placement. Dialysis coordinator aware of discharge plan and following for pending outpatient needs.   #2: Anemia: secondary to chronic kidney disease.  Hgb 9.2 Will continue to monitor  #3. Secondary Hyperparathyroidism:  Lab Results  Component Value Date   CALCIUM 8.7 (L) 02/16/2021   PHOS 4.3 02/14/2021   Calcium low, continue cholecalciferol Phosphorus at goal    #4: Right hip fracture: Opted for medical management. Rehab at discharge   #5: Herpes zoster: Acyclovir.     LOS: [place],  [facility] 9/26/202210:30 AM

## 2021-02-16 NOTE — Progress Notes (Signed)
Physical Therapy Treatment Patient Details Name: AARAN ENBERG MRN: [id number] DOB: 12-23-1930 Today's Date: 02/16/2021   History of Present Illness MERLE CIRELLI is a 87 y.o. male who was here in early August with fall and R humeral fracture, went to rehab and now here with another fall and pelvic fracture.  Medical history significant for diabetes mellitus with complications of end-stage renal disease on hemodialysis (dialysis days are Tuesday/Thursday/Saturday), coronary artery disease status post CABG, hypertension.    PT Comments    Patient cooperative and eager to participate with PT today. Patient continues to need assistance for all mobility. Patient required assistance for standing and for weight shifting/rolling walker negotiation assistance for pivot transfer to chair. Increased time and effort required for all mobility with mild shortness of breath with exertional activity. Limited standing tolerance and overall endurance. Continue to recommend SNF at discharge and ongoing PT to maximize independence and decrease caregiver burden.    Recommendations for follow up therapy are one component of a multi-disciplinary discharge planning process, led by the attending physician.  Recommendations may be updated based on patient status, additional functional criteria and insurance authorization.  Follow Up Recommendations  SNF;Supervision for mobility/OOB     Equipment Recommendations  Other (comment) (to be determined at next level of care)    Recommendations for Other Services       Precautions / Restrictions Precautions Precautions: Fall Restrictions Weight Bearing Restrictions: Yes RUE Weight Bearing: Weight bearing as tolerated RLE Weight Bearing: Weight bearing as tolerated     Mobility  Bed Mobility Overal bed mobility: Needs Assistance Bed Mobility: Supine to Sit     Supine to sit: Mod assist;HOB elevated     General bed mobility comments: patient needs most  assistance for facilitation for upright trunk. increased time and effort required. verbal cues provided for technique    Transfers Overall transfer level: Needs assistance Equipment used: Rolling walker (2 wheeled) Transfers: Sit to/from Stand Sit to Stand: Min assist Stand pivot transfers: Min assist       General transfer comment: patient needs Mod A for lift off and cues for forward weight shifting. bed height was raised prior to standing per patient request. Min A required for pivoting to right for weight shifting, intermittent assistance for rolling walker negotiation. patient relying heavily on rolling walker for support in standing  Ambulation/Gait             General Gait Details: unable to progress to ambulation due to limited standing tolerance, pain with upright activity, difficulty weightshifting for advancement of LE during pivot transfer   Stairs             Wheelchair Mobility    Modified Rankin (Stroke Patients Only)       Balance Overall balance assessment: Needs assistance Sitting-balance support: Single extremity supported Sitting balance-Leahy Scale: Good     Standing balance support: Bilateral upper extremity supported Standing balance-Leahy Scale: Fair Standing balance comment: close stand by assistance for static standing balance                            Cognition Arousal/Alertness: Awake/alert Behavior During Therapy: WFL for tasks assessed/performed Overall Cognitive Status: Within Functional Limits for tasks assessed  Exercises      General Comments        Pertinent Vitals/Pain Pain Assessment: 0-10 Pain Score: 9  Pain Location: right RLE pelvis and thigh; minimal pain reported RUE Pain Descriptors / Indicators: Discomfort;Guarding Pain Intervention(s): Ice applied;Repositioned    Home Living                      Prior Function             PT Goals (current goals can now be found in the care plan section) Acute Rehab PT Goals PT Goal Formulation: With patient Time For Goal Achievement: 02/25/21 Potential to Achieve Goals: Fair Progress towards PT goals: Progressing toward goals    Frequency    Min 2X/week      PT Plan Current plan remains appropriate    Co-evaluation              AM-PAC PT "6 Clicks" Mobility   Outcome Measure  Help needed turning from your back to your side while in a flat bed without using bedrails?: A Lot Help needed moving from lying on your back to sitting on the side of a flat bed without using bedrails?: A Lot Help needed moving to and from a bed to a chair (including a wheelchair)?: A Lot Help needed standing up from a chair using your arms (e.g., wheelchair or bedside chair)?: A Lot Help needed to walk in hospital room?: A Lot Help needed climbing 3-5 steps with a railing? : Total 6 Click Score: 11    End of Session   Activity Tolerance: Patient tolerated treatment well;Patient limited by pain;Patient limited by fatigue Patient left: in chair;with call bell/phone within reach;with nursing/sitter in room (ice pack given for patient to apply to thigh/pelvic area as needed for pain) Nurse Communication: Mobility status PT Visit Diagnosis: Muscle weakness (generalized) (M62.81);Difficulty in walking, not elsewhere classified (R26.2);Pain;Unsteadiness on feet (R26.81) Pain - Right/Left: Right Pain - part of body: Hip     Time: 7564-3329 PT Time Calculation (min) (ACUTE ONLY): 25 min  Charges:  $Therapeutic Activity: 23-37 mins                     Minna Merritts, PT, MPT   Percell Locus 02/16/2021, 10:15 AM

## 2021-02-16 NOTE — Consult Note (Signed)
WOC Nurse Consult Note: Reason for Consult: Stage 2 pressure injury to right buttock at gluteal cleft, POA Wound type: Pressure Pressure Injury POA: Yes Measurement:1cm x 1cm x 0.2cm per Nursing Flow Sheet Wound FTD:DUKG, moist Drainage (amount, consistency, odor) scant serous Periwound: intact Dressing procedure/placement/frequency: GUidence is provided for nursing for the topical care of this wound using a xeroform gauze (antimicrobial nonadherent) as a wound contact layer topped with a dry gauze 2x2 and covered with a silicone foam dressing. The patient is to be turned from side to side and the supine position minimized. Heel pressure injury prevention will be accomplished with pressure redistribution heel boots (Prevalon).  [place] nursing team will not follow, but will remain available to this patient, the nursing and medical teams.  Please re-consult if needed. Thanks, Maudie Flakes, MSN, RN, [place], Arther Abbott  Pager# [phone]

## 2021-02-16 NOTE — TOC Progression Note (Signed)
Transition of Care [facility]) - Progression Note    Patient Details  Name: DAVONTAY WATLINGTON MRN: [id number] Date of Birth: [birth year]  Transition of Care [facility]) CM/SW Contact  Su Hilt, RN Phone Number: 02/16/2021, 11:09 AM  Clinical Narrative:    Spoke with the patient's daughters Horris Latino and Diane on the phone they agree to [facility], I explained that [facility] can transport on Sat but in the even they cant The family will need to transport, Horris Latino stated that they would be able to except for this Sat, the person that would transport will not be available until after 12 noon on Sat, notified Magda Paganini at [facility]   Expected Discharge Plan: [place] Barriers to Discharge: Continued Medical Work up  Expected Discharge Plan and Services Expected Discharge Plan: [place]                                               Social Determinants of Health (SDOH) Interventions    Readmission Risk Interventions Readmission Risk Prevention Plan 02/12/2021  Transportation Screening Complete  Medication Review Press photographer) Complete  SW Recovery Care/Counseling Consult Complete  Skilled Nursing Facility Complete  Some recent data might be hidden

## 2021-02-16 NOTE — Consult Note (Signed)
NAME: Matthew [name]  DOB: Oct 19, 1930  MRN: [id number]  Date/Time: 02/16/2021 3:58 PM  REQUESTING PROVIDER: Dr.Shah Subjective:  REASON FOR CONSULT: Herpes zoster ? Matthew [name] is a 87 y.o. male with a history of ESRD on dialysis by HD catheter, Cad s/p CABG, HTN, CHF, DM presented to the ED on 02/11/21 after a fall when he was stepping off a weighing machine.He fractured the pubic rami . No surgery was done- He is waiting to go to SNF and I am asked to see him for herpes zoster. As per patient he had shingles in Aug and was treated- he had some sharp pain lingering on the rt side and thought he had another recrudescence and given another course in Sept. Pt is doing fine  No fever Has pain  rt shoulder and fracture from a previous fall    Past Medical History:  Diagnosis Date   Anemia    Arthritis    Broken arm 05/2013   Left   CHF (congestive heart failure) ([place])    Chronic kidney disease    Coronary artery disease    Diabetes mellitus without complication ([place])    Dyspnea    DOE   Edema    FEET/LEGS OCCAS   Hypertension    Myocardial infarction ([place])    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   left frontal scalp, [facility]   Squamous cell carcinoma of skin 05/05/2020   left temporal scalp, in situ, [facility] 05/13/20    Past Surgical History:  Procedure Laterality Date   CATARACT EXTRACTION W/PHACO Left 07/26/2017   Procedure: CATARACT EXTRACTION PHACO AND INTRAOCULAR LENS PLACEMENT ([place]);  Surgeon: Birder Robson, MD;  Location: [facility] ORS;  Service: Ophthalmology;  Laterality: Left;  [place]   00:45.8 AP%  13.1 CDE  6.00 Fluid Pack Lot # [id number]   CATARACT EXTRACTION W/PHACO Right 08/17/2017   Procedure: CATARACT EXTRACTION PHACO AND INTRAOCULAR LENS PLACEMENT ([facility]);  Surgeon: Birder Robson, MD;  Location: [facility] ORS;  Service: Ophthalmology;  Laterality: Right;  [place]  01:30 AP% 16.8 CDE 15.24 Fluid pack lot # [id number]   CHOLECYSTECTOMY     CORONARY ANGIOPLASTY     STENTS    CORONARY ARTERY BYPASS GRAFT     DIALYSIS/PERMA CATHETER INSERTION N/A 02/09/2018   Procedure: DIALYSIS/PERMA CATHETER INSERTION;  Surgeon: Algernon Huxley, MD;  Location: [facility] CV LAB;  Service: Cardiovascular;  Laterality: N/A;   FRACTURE SURGERY     ARM   INSERTION OF DIALYSIS CATHETER Right 11/03/2020   Procedure: INSERTION OF Perm Cath in the RIGHT INTERNAL JUGULAR;  Surgeon: Algernon Huxley, MD;  Location: [facility] ORS;  Service: Vascular;  Laterality: Right;   LEFT HEART CATHETERIZATION WITH CORONARY/GRAFT ANGIOGRAM Left 12/04/2013   Procedure: LEFT HEART CATHETERIZATION WITH Beatrix Fetters;  Surgeon: Leonie Man, MD;  Location: [facility] CATH LAB;  Service: Cardiovascular;  Laterality: Left;   PERCUTANEOUS CORONARY STENT INTERVENTION (PCI-S) N/A 12/06/2013   Procedure: PERCUTANEOUS CORONARY STENT INTERVENTION (PCI-S);  Surgeon: Sinclair Grooms, MD;  Location: [facility] CATH LAB;  Service: Cardiovascular;  Laterality: N/A;   REMOVAL OF A DIALYSIS CATHETER N/A 11/03/2020   Procedure: REMOVAL OF A DIALYSIS CATHETER;  Surgeon: Algernon Huxley, MD;  Location: [facility] ORS;  Service: Vascular;  Laterality: N/A;   VEIN BYPASS SURGERY     [place] Bypass Surgery     Social History   Socioeconomic History   Marital status: Married    Spouse name: Not on file   Number  of children: Not on file   Years of education: Not on file   Highest education level: Not on file  Occupational History   Not on file  Tobacco Use   Smoking status: Former    Types: Cigarettes   Smokeless tobacco: Never   Tobacco comments:    quit 30 yrs ago, smoked 14-15 yrs, mainly pipe  Vaping Use   Vaping Use: Never used  Substance and Sexual Activity   Alcohol use: Not Currently    Alcohol/week: 1.0 standard drink    Types: 1 Cans of beer per week    Comment: occasionally drinking only,once a month   Drug use: No   Sexual activity: Never  Other Topics Concern   Not on file  Social History Narrative   Not on file    Social Determinants of Health   Financial Resource Strain: Not on file  Food Insecurity: Not on file  Transportation Needs: Not on file  Physical Activity: Not on file  Stress: Not on file  Social Connections: Not on file  Intimate Partner Violence: Not on file    Family History  Problem Relation Age of Onset   Heart attack Father 10       died first MI at age 55   Heart failure Mother    Allergies  Allergen Reactions   Other     Other reaction(s): Myalgias (intolerance), Other (See Comments) Statin Drugs  Statin Drugs     Amoxicillin Diarrhea   I? Current Facility-Administered Medications  Medication Dose Route Frequency Provider Last Rate Last Admin   acyclovir ointment (ZOVIRAX) 5 % 1 application  1 application Topical Daily PRN Max Sane, MD       amiodarone (PACERONE) tablet 200 mg  200 mg Oral Daily Manuella Ghazi, Vipul, MD   200 mg at 02/16/21 0951   bisacodyl (DULCOLAX) suppository 10 mg  10 mg Rectal Daily PRN Max Sane, MD       Chlorhexidine Gluconate Cloth 2 % PADS 6 each  6 each Topical Q0600 Colon Flattery, NP   6 each at 02/16/21 0600   cholecalciferol (VITAMIN D) tablet 1,000 Units  1,000 Units Oral Q breakfast Max Sane, MD   1,000 Units at 02/16/21 0951   clopidogrel (PLAVIX) tablet 75 mg  75 mg Oral Q breakfast Max Sane, MD   75 mg at 02/16/21 0951   ezetimibe (ZETIA) tablet 10 mg  10 mg Oral QAC breakfast Max Sane, MD   10 mg at 02/16/21 0950   ferrous sulfate tablet 325 mg  325 mg Oral BID WC Max Sane, MD   325 mg at 02/16/21 0951   fluticasone (FLONASE) 50 MCG/ACT nasal spray 2 spray  2 spray Each Nare Daily PRN Max Sane, MD       gabapentin (NEURONTIN) capsule 300 mg  300 mg Oral QHS Lu Duffel, RPH   300 mg at 02/15/21 2108   heparin injection 5,000 Units  5,000 Units Subcutaneous Q8H Max Sane, MD   5,000 Units at 02/16/21 1430   HYDROcodone-acetaminophen (NORCO/VICODIN) 5-325 MG per tablet 1 tablet  1 tablet Oral Q6H PRN  Max Sane, MD   1 tablet at 02/16/21 1011   levothyroxine (SYNTHROID) tablet 75 mcg  75 mcg Oral QAC breakfast Max Sane, MD   75 mcg at 02/16/21 0643   loratadine (CLARITIN) tablet 10 mg  10 mg Oral Daily Max Sane, MD   10 mg at 02/16/21 0951   midodrine (PROAMATINE) tablet 5 mg  5 mg Oral TID WC Dorothe Pea, RPH   5 mg at 02/16/21 1217   multivitamin (RENA-VIT) tablet 1 tablet  1 tablet Oral Daily Max Sane, MD   1 tablet at 02/16/21 0951   nitroGLYCERIN (NITROSTAT) SL tablet 0.4 mg  0.4 mg Sublingual Q5 min PRN Max Sane, MD       polyethylene glycol (MIRALAX / GLYCOLAX) packet 17 g  17 g Oral Daily Max Sane, MD   17 g at 02/16/21 0951   rosuvastatin (CRESTOR) tablet 40 mg  40 mg Oral QHS Max Sane, MD   40 mg at 02/15/21 2107   senna-docusate (Senokot-S) tablet 2 tablet  2 tablet Oral BID Max Sane, MD   2 tablet at 02/16/21 0951   traMADol (ULTRAM) tablet 50 mg  50 mg Oral Q6H PRN Max Sane, MD   50 mg at 02/15/21 1257     Abtx:  Anti-infectives (From admission, onward)    None       REVIEW OF SYSTEMS:  Const: negative fever, negative chills, negative weight loss Eyes: negative diplopia or visual changes, negative eye pain ENT: negative coryza, negative sore throat Resp: negative cough, hemoptysis, dyspnea Cards: negative for chest pain, palpitations, lower extremity edema GU: negative for frequency, dysuria and hematuria GI: Negative for abdominal pain, diarrhea, bleeding, constipation Skin: skin tears and bruises Heme:  easy bruising  MS: pain rt shoulder and hips Neurolo:falls  Psych: negative for feelings of anxiety, depression  Endocrine: , diabetes Allergy/Immunology-as above  Objective:  VITALS:  BP 128/60 (BP Location: Left Arm)   Pulse 68   Temp 98 F (36.7 C) (Oral)   Resp 18   Ht 6' (1.829 m)   Wt 94.8 kg   SpO2 100%   BMI 28.34 kg/m  PHYSICAL EXAM:  General: Alert, cooperative, some distress, appears stated age.  Head:  Normocephalic, without obvious abnormality, atraumatic. Eyes: Conjunctivae clear, anicteric sclerae. Pupils are equal ENT Nares normal. No drainage or sinus tenderness. Lips, mucosa, and tongue normal. No Thrush Neck: Supple, symmetrical, no adenopathy, thyroid: non tender no carotid bruit and no JVD. Back: No CVA tenderness. Lungs: Clear to auscultation bilaterally. No Wheezing or Rhonchi. No rales. Heart:irregular  Abdomen: Soft, non-tender,not distended. Bowel sounds normal. No masses Extremities: rt shoulder restricted abduction Skin: bruises and tears arms Rt side of chest- pigmented, erythematous macular lesions- no vesicles or pustules     Lymph: Cervical, supraclavicular normal. Neurologic: Grossly non-focal Pertinent Labs Lab Results CBC    Component Value Date/Time   WBC 5.2 02/16/2021 0412   RBC 2.93 (L) 02/16/2021 0412   HGB 9.2 (L) 02/16/2021 0412   HCT 28.0 (L) 02/16/2021 0412   PLT 149 (L) 02/16/2021 0412   MCV 95.6 02/16/2021 0412   MCH 31.4 02/16/2021 0412   MCHC 32.9 02/16/2021 0412   RDW 14.5 02/16/2021 0412   LYMPHSABS 0.7 02/11/2021 0847   MONOABS 0.9 02/11/2021 0847   EOSABS 0.5 02/11/2021 0847   BASOSABS 0.1 02/11/2021 0847    CMP Latest Ref Rng & Units 02/16/2021 02/15/2021 02/14/2021  Glucose 70 - 99 mg/dL 122(H) 111(H) 122(H)  BUN 8 - 23 mg/dL 32(H) 22 39(H)  Creatinine 0.61 - 1.24 mg/dL 2.65(H) 2.09(H) 2.77(H)  Sodium 135 - 145 mmol/L 136 136 134(L)  Potassium 3.5 - 5.1 mmol/L 3.9 4.1 4.3  Chloride 98 - 111 mmol/L 98 96(L) 97(L)  CO2 22 - 32 mmol/L 29 29 28   Calcium 8.9 - 10.3 mg/dL 8.7(L) 8.6(L) 8.6(L)  Total Protein 6.5 - 8.1 g/dL - - -  Total Bilirubin 0.3 - 1.2 mg/dL - - -  Alkaline Phos 38 - 126 U/L - - -  AST 15 - 41 U/L - - -  ALT 0 - 44 U/L - - -      Microbiology: Recent Results (from the past 240 hour(s))  Resp Panel by RT-PCR (Flu A&B, Covid) Nasopharyngeal Swab     Status: None   Collection Time: 02/11/21  9:45 AM    Specimen: Nasopharyngeal Swab; Nasopharyngeal(NP) swabs in vial transport medium  Result Value Ref Range Status   SARS Coronavirus 2 by RT PCR NEGATIVE NEGATIVE Final    Comment: (NOTE) SARS-CoV-2 target nucleic acids are NOT DETECTED.  The SARS-CoV-2 RNA is generally detectable in upper respiratory specimens during the acute phase of infection. The lowest concentration of SARS-CoV-2 viral copies this assay can detect is 138 copies/mL. A negative result does not preclude SARS-Cov-2 infection and should not be used as the sole basis for treatment or other patient management decisions. A negative result may occur with  improper specimen collection/handling, submission of specimen other than nasopharyngeal swab, presence of viral mutation(s) within the areas targeted by this assay, and inadequate number of viral copies(<138 copies/mL). A negative result must be combined with clinical observations, patient history, and epidemiological information. The expected result is Negative.  Fact Sheet for Patients:  EntrepreneurPulse.com.au  Fact Sheet for Healthcare Providers:  IncredibleEmployment.be  This test is no t yet approved or cleared by the [place] FDA and  has been authorized for detection and/or diagnosis of SARS-CoV-2 by FDA under an Emergency Use Authorization (EUA). This EUA will remain  in effect (meaning this test can be used) for the duration of the COVID-19 declaration under Section 564(b)(1) of the Act, 21 U.S.C.section 360bbb-3(b)(1), unless the authorization is terminated  or revoked sooner.       Influenza A by PCR NEGATIVE NEGATIVE Final   Influenza B by PCR NEGATIVE NEGATIVE Final    Comment: (NOTE) The Xpert Xpress SARS-CoV-2/FLU/RSV plus assay is intended as an aid in the diagnosis of influenza from Nasopharyngeal swab specimens and should not be used as a sole basis for treatment. Nasal washings and aspirates are  unacceptable for Xpert Xpress SARS-CoV-2/FLU/RSV testing.  Fact Sheet for Patients: EntrepreneurPulse.com.au  Fact Sheet for Healthcare Providers: IncredibleEmployment.be  This test is not yet approved or cleared by the [place] FDA and has been authorized for detection and/or diagnosis of SARS-CoV-2 by FDA under an Emergency Use Authorization (EUA). This EUA will remain in effect (meaning this test can be used) for the duration of the COVID-19 declaration under Section 564(b)(1) of the Act, 21 U.S.C. section 360bbb-3(b)(1), unless the authorization is terminated or revoked.  Performed at [facility], [address] [place], [place] [address]   MRSA Next Gen by PCR, Nasal     Status: None   Collection Time: 02/11/21 10:58 PM   Specimen: Nasal Mucosa; Nasal Swab  Result Value Ref Range Status   MRSA by PCR Next Gen NOT DETECTED NOT DETECTED Final    Comment: (NOTE) The GeneXpert MRSA Assay (FDA approved for NASAL specimens only), is one component of a comprehensive MRSA colonization surveillance program. It is not intended to diagnose MRSA infection nor to guide or monitor treatment for MRSA infections. Test performance is not FDA approved in patients less than 76 years old. Performed at [facility], [address], [place], [place] [address]  IMAGING RESULTS: Cxr cardiomegaly I have personally reviewed the films ? Impression/Recommendation ?Healed herpes zoster on the rt side of chest- no active lesions- not treatment or airborne precaution needed POSt herpetic neuralgia  H/o fall and fracture Acute right superior and inferior pubic rami  Recent Rt shoulder fracture ESRD on dialysis   Discussed the management with patient, friend at bedside and Requesting provider  ID will sign off - call if needed ? ? ___________________________________________________ Discussed with patient, requesting  provider Note:  This document was prepared using Dragon voice recognition software and may include unintentional dictation errors.

## 2021-02-16 NOTE — TOC Progression Note (Signed)
Transition of Care [facility]) - Progression Note    Patient Details  Name: LINDBERG ZENON MRN: [id number] Date of Birth: [birth year]  Transition of Care [facility]) CM/SW Contact  Su Hilt, RN Phone Number: 02/16/2021, 9:30 AM  Clinical Narrative:   Spoke to the patient's daughter Horris Latino on the phone, She gave approval to accept the bed offer from [facility], He goes to Dialysis on TTS at [address] at 615 AM, transported by [facility], [facility] may not  be able to transport on Sat,  In the event they are unable to transport the family will need to  take him to the Sat Chair time, The patient has had all 5 covid vaccines, 05/30/19, 06/20/19, 02/22/20, 09/03/20, 02/04/21. He will need to be quarantined for Shingles and Magda Paganini at [facility] is aware, The bed will be available tomorrow for DC    Expected Discharge Plan: [place] Barriers to Discharge: Continued Medical Work up  Expected Discharge Plan and Services Expected Discharge Plan: [place]                                               Social Determinants of Health (SDOH) Interventions    Readmission Risk Interventions Readmission Risk Prevention Plan 02/12/2021  Transportation Screening Complete  Medication Review Press photographer) Complete  SW Recovery Care/Counseling Consult Complete  Skilled Nursing Facility Complete  Some recent data might be hidden

## 2021-02-16 NOTE — TOC Progression Note (Signed)
Transition of Care [facility]) - Progression Note    Patient Details  Name: Matthew [name] MRN: [id number] Date of Birth: [birth year]  Transition of Care [facility]) CM/SW [place], RN Phone Number: 02/16/2021, 9:11 AM  Clinical Narrative:     [facility] and spoke with [facility], She stated they do not have access to the [facility] and requested that the referral be faxed to them at [phone], she stated that she is not sure if They have a bed or not and will not give that information until they review the fax and they review, He is still contagious with lesions from shingles and will need Quarantined. [facility] also would not be able to accept the patient due to needing quarantine and not having Quarantine beds, they will check with [facility] and let me know if they have beds   Expected Discharge Plan: [place] Barriers to Discharge: Continued Medical Work up  Expected Discharge Plan and Services Expected Discharge Plan: [place]                                               Social Determinants of Health (SDOH) Interventions    Readmission Risk Interventions Readmission Risk Prevention Plan 02/12/2021  Transportation Screening Complete  Medication Review Press photographer) Complete  SW Recovery Care/Counseling Consult Complete  Skilled Nursing Facility Complete  Some recent data might be hidden

## 2021-02-16 NOTE — Progress Notes (Signed)
Occupational Therapy Treatment Patient Details Name: Matthew [name] MRN: [id number] DOB: 11/12/1930 Today's Date: 02/16/2021   History of present illness Matthew [name] is a 87 y.o. male who was here in early August with fall and R humeral fracture, went to rehab and now here with another fall and pelvic fracture.  Medical history significant for diabetes mellitus with complications of end-stage renal disease on hemodialysis (dialysis days are Tuesday/Thursday/Saturday), coronary artery disease status post CABG, hypertension.   OT comments  Pt seen for OT treatment on this date. Upon arrival to room, pt awake and seated upright in recliner. Pt reporting 6/10 pain however agreeable to OT tx. This date, pt able to perform sit<>stand transfer with MOD A from recliner and standing grooming tasks with MIN GUARD. Following x2 standing grooming tasks, pt with increased work of breath and reporting fatigue. Pt able to complete remaining grooming tasks seated following SET-UP assist. At end of session, pt left in recliner, with daughter present, and pt in no acute distress. Pt continues to present with pain, decreased strength, and decreased activity tolerance, and would benefit from additional skilled OT services to maximize return to PLOF and minimize risk of future falls, injury, caregiver burden, and readmission. Will continue to follow POC. Discharge recommendation remains appropriate.     Recommendations for follow up therapy are one component of a multi-disciplinary discharge planning process, led by the attending physician.  Recommendations may be updated based on patient status, additional functional criteria and insurance authorization.    Follow Up Recommendations  SNF    Equipment Recommendations  3 in 1 bedside commode;Tub/shower seat;Other (comment) (2ww)       Precautions / Restrictions Precautions Precautions: Fall Precaution Comments: per ortho: can start lightly using R UE as  tolerated Restrictions Weight Bearing Restrictions: Yes RUE Weight Bearing: Weight bearing as tolerated RLE Weight Bearing: Weight bearing as tolerated       Mobility Bed Mobility Overal bed mobility: Needs Assistance          General bed mobility comments: not assessed, pt in recliner at beginning/end of session    Transfers Overall transfer level: Needs assistance Equipment used: Rolling walker (2 wheeled) Transfers: Sit to/from Stand Sit to Stand: Mod assist        General transfer comment: Requires MOD A for upward momentum from recliner and verbal cues for upright posture in standing    Balance Overall balance assessment: Needs assistance Sitting-balance support: No upper extremity supported;Feet supported Sitting balance-Leahy Scale: Good Sitting balance - Comments: good sitting balance reaching within BOS   Standing balance support: During functional activity;No upper extremity supported Standing balance-Leahy Scale: Fair Standing balance comment: MIN GUARD for standing balance at sink during grooming tasks                           ADL either performed or assessed with clinical judgement   ADL Overall ADL's : Needs assistance/impaired     Grooming: Wash/dry hands;Oral care;Brushing hair;Min guard;Standing;Wash/dry face;Set up;Sitting                                        Cognition Arousal/Alertness: Awake/alert Behavior During Therapy: WFL for tasks assessed/performed Overall Cognitive Status: Within Functional Limits for tasks assessed  Pertinent Vitals/ Pain       Pain Assessment: 0-10 Pain Score: 6  Pain Location: right RLE pelvis and thigh; minimal pain reported RUE Pain Descriptors / Indicators: Discomfort;Guarding Pain Intervention(s): Monitored during session;Premedicated before session;Repositioned;Ice applied         Frequency  Min  1X/week        Progress Toward Goals  OT Goals(current goals can now be found in the care plan section)  Progress towards OT goals: Progressing toward goals  Acute Rehab OT Goals Patient Stated Goal: to walk again OT Goal Formulation: With patient Time For Goal Achievement: 02/26/21 Potential to Achieve Goals: Good  Plan Discharge plan remains appropriate;Frequency remains appropriate       AM-PAC OT "6 Clicks" Daily Activity     Outcome Measure   Help from another person eating meals?: None Help from another person taking care of personal grooming?: A Little Help from another person toileting, which includes using toliet, bedpan, or urinal?: A Lot Help from another person bathing (including washing, rinsing, drying)?: A Lot Help from another person to put on and taking off regular upper body clothing?: A Little Help from another person to put on and taking off regular lower body clothing?: A Lot 6 Click Score: 16    End of Session Equipment Utilized During Treatment: Gait belt;Rolling walker  OT Visit Diagnosis: Unsteadiness on feet (R26.81);Muscle weakness (generalized) (M62.81);History of falling (Z91.81)   Activity Tolerance Patient tolerated treatment well   Patient Left in chair;with call bell/phone within reach;with chair alarm set;with family/visitor present   Nurse Communication Mobility status        Time: 8889-1694 OT Time Calculation (min): 23 min  Charges: OT General Charges $OT Visit: 1 Visit OT Treatments $Self Care/Home Management : 23-37 mins  Fredirick Maudlin, OTR/L [place]

## 2021-02-17 DIAGNOSIS — Z992 Dependence on renal dialysis: Secondary | ICD-10-CM | POA: Diagnosis not present

## 2021-02-17 DIAGNOSIS — I25810 Atherosclerosis of coronary artery bypass graft(s) without angina pectoris: Secondary | ICD-10-CM | POA: Diagnosis not present

## 2021-02-17 DIAGNOSIS — I2510 Atherosclerotic heart disease of native coronary artery without angina pectoris: Secondary | ICD-10-CM | POA: Diagnosis not present

## 2021-02-17 DIAGNOSIS — J309 Allergic rhinitis, unspecified: Secondary | ICD-10-CM | POA: Diagnosis not present

## 2021-02-17 DIAGNOSIS — W19XXXA Unspecified fall, initial encounter: Secondary | ICD-10-CM | POA: Diagnosis not present

## 2021-02-17 DIAGNOSIS — S3289XA Fracture of other parts of pelvis, initial encounter for closed fracture: Secondary | ICD-10-CM | POA: Diagnosis not present

## 2021-02-17 DIAGNOSIS — I480 Paroxysmal atrial fibrillation: Secondary | ICD-10-CM | POA: Diagnosis not present

## 2021-02-17 DIAGNOSIS — Z9861 Coronary angioplasty status: Secondary | ICD-10-CM | POA: Diagnosis not present

## 2021-02-17 DIAGNOSIS — I5042 Chronic combined systolic (congestive) and diastolic (congestive) heart failure: Secondary | ICD-10-CM | POA: Diagnosis not present

## 2021-02-17 DIAGNOSIS — B028 Zoster with other complications: Secondary | ICD-10-CM | POA: Diagnosis not present

## 2021-02-17 DIAGNOSIS — K219 Gastro-esophageal reflux disease without esophagitis: Secondary | ICD-10-CM | POA: Diagnosis not present

## 2021-02-17 DIAGNOSIS — L8942 Pressure ulcer of contiguous site of back, buttock and hip, stage 2: Secondary | ICD-10-CM | POA: Diagnosis not present

## 2021-02-17 DIAGNOSIS — L89312 Pressure ulcer of right buttock, stage 2: Secondary | ICD-10-CM | POA: Diagnosis not present

## 2021-02-17 DIAGNOSIS — I951 Orthostatic hypotension: Secondary | ICD-10-CM

## 2021-02-17 DIAGNOSIS — S42209D Unspecified fracture of upper end of unspecified humerus, subsequent encounter for fracture with routine healing: Secondary | ICD-10-CM | POA: Diagnosis not present

## 2021-02-17 DIAGNOSIS — W19XXXD Unspecified fall, subsequent encounter: Secondary | ICD-10-CM | POA: Diagnosis not present

## 2021-02-17 DIAGNOSIS — D638 Anemia in other chronic diseases classified elsewhere: Secondary | ICD-10-CM | POA: Diagnosis not present

## 2021-02-17 DIAGNOSIS — E039 Hypothyroidism, unspecified: Secondary | ICD-10-CM | POA: Diagnosis not present

## 2021-02-17 DIAGNOSIS — E1122 Type 2 diabetes mellitus with diabetic chronic kidney disease: Secondary | ICD-10-CM | POA: Diagnosis not present

## 2021-02-17 DIAGNOSIS — E785 Hyperlipidemia, unspecified: Secondary | ICD-10-CM | POA: Diagnosis not present

## 2021-02-17 DIAGNOSIS — F1090 Alcohol use, unspecified, uncomplicated: Secondary | ICD-10-CM | POA: Diagnosis not present

## 2021-02-17 DIAGNOSIS — N2581 Secondary hyperparathyroidism of renal origin: Secondary | ICD-10-CM | POA: Diagnosis not present

## 2021-02-17 DIAGNOSIS — Z7401 Bed confinement status: Secondary | ICD-10-CM | POA: Diagnosis not present

## 2021-02-17 DIAGNOSIS — S32511A Fracture of superior rim of right pubis, initial encounter for closed fracture: Secondary | ICD-10-CM | POA: Diagnosis not present

## 2021-02-17 DIAGNOSIS — N186 End stage renal disease: Secondary | ICD-10-CM | POA: Diagnosis not present

## 2021-02-17 DIAGNOSIS — I10 Essential (primary) hypertension: Secondary | ICD-10-CM | POA: Diagnosis not present

## 2021-02-17 DIAGNOSIS — E663 Overweight: Secondary | ICD-10-CM | POA: Diagnosis not present

## 2021-02-17 DIAGNOSIS — E7800 Pure hypercholesterolemia, unspecified: Secondary | ICD-10-CM | POA: Diagnosis not present

## 2021-02-17 DIAGNOSIS — I959 Hypotension, unspecified: Secondary | ICD-10-CM | POA: Diagnosis not present

## 2021-02-17 DIAGNOSIS — S329XXD Fracture of unspecified parts of lumbosacral spine and pelvis, subsequent encounter for fracture with routine healing: Secondary | ICD-10-CM | POA: Diagnosis not present

## 2021-02-17 DIAGNOSIS — M1990 Unspecified osteoarthritis, unspecified site: Secondary | ICD-10-CM | POA: Diagnosis not present

## 2021-02-17 DIAGNOSIS — M8000XD Age-related osteoporosis with current pathological fracture, unspecified site, subsequent encounter for fracture with routine healing: Secondary | ICD-10-CM | POA: Diagnosis not present

## 2021-02-17 DIAGNOSIS — Z85828 Personal history of other malignant neoplasm of skin: Secondary | ICD-10-CM | POA: Diagnosis not present

## 2021-02-17 DIAGNOSIS — I5022 Chronic systolic (congestive) heart failure: Secondary | ICD-10-CM | POA: Diagnosis not present

## 2021-02-17 DIAGNOSIS — M25511 Pain in right shoulder: Secondary | ICD-10-CM | POA: Diagnosis not present

## 2021-02-17 DIAGNOSIS — D631 Anemia in chronic kidney disease: Secondary | ICD-10-CM | POA: Diagnosis not present

## 2021-02-17 DIAGNOSIS — I252 Old myocardial infarction: Secondary | ICD-10-CM | POA: Diagnosis not present

## 2021-02-17 DIAGNOSIS — Z7984 Long term (current) use of oral hypoglycemic drugs: Secondary | ICD-10-CM | POA: Diagnosis not present

## 2021-02-17 DIAGNOSIS — Z87891 Personal history of nicotine dependence: Secondary | ICD-10-CM | POA: Diagnosis not present

## 2021-02-17 DIAGNOSIS — R600 Localized edema: Secondary | ICD-10-CM | POA: Diagnosis not present

## 2021-02-17 LAB — CBC
HCT: 31.7 % — ABNORMAL LOW (ref 39.0–52.0)
Hemoglobin: 10.4 g/dL — ABNORMAL LOW (ref 13.0–17.0)
MCH: 31.5 pg (ref 26.0–34.0)
MCHC: 32.8 g/dL (ref 30.0–36.0)
MCV: 96.1 fL (ref 80.0–100.0)
Platelets: 159 10*3/uL (ref 150–400)
RBC: 3.3 MIL/uL — ABNORMAL LOW (ref 4.22–5.81)
RDW: 14.5 % (ref 11.5–15.5)
WBC: 5.2 10*3/uL (ref 4.0–10.5)
nRBC: 0 % (ref 0.0–0.2)

## 2021-02-17 LAB — BASIC METABOLIC PANEL
Anion gap: 7 (ref 5–15)
BUN: 36 mg/dL — ABNORMAL HIGH (ref 8–23)
CO2: 30 mmol/L (ref 22–32)
Calcium: 8.9 mg/dL (ref 8.9–10.3)
Chloride: 99 mmol/L (ref 98–111)
Creatinine, Ser: 2.93 mg/dL — ABNORMAL HIGH (ref 0.61–1.24)
GFR, Estimated: 20 mL/min — ABNORMAL LOW (ref >60)
Glucose, Bld: 173 mg/dL — ABNORMAL HIGH (ref 70–99)
Potassium: 4.2 mmol/L (ref 3.5–5.1)
Sodium: 136 mmol/L (ref 135–145)

## 2021-02-17 LAB — MAGNESIUM: Magnesium: 2.1 mg/dL (ref 1.7–2.4)

## 2021-02-17 LAB — GLUCOSE, CAPILLARY
Glucose-Capillary: 144 mg/dL — ABNORMAL HIGH (ref 70–99)
Glucose-Capillary: 210 mg/dL — ABNORMAL HIGH (ref 70–99)

## 2021-02-17 MED ORDER — GABAPENTIN 300 MG PO CAPS: 300.0000 mg | ORAL_CAPSULE | Freq: Every day | ORAL | Status: DC

## 2021-02-17 MED ORDER — TRAMADOL HCL 50 MG PO TABS: 50.0000 mg | ORAL_TABLET | Freq: Four times a day (QID) | ORAL | 0 refills | Status: DC | PRN

## 2021-02-17 MED ORDER — MIDODRINE HCL 5 MG PO TABS: 5.0000 mg | ORAL_TABLET | Freq: Three times a day (TID) | ORAL | Status: DC

## 2021-02-17 MED ORDER — HYDROCODONE-ACETAMINOPHEN 5-325 MG PO TABS: 1.0000 | ORAL_TABLET | Freq: Four times a day (QID) | ORAL | 0 refills | Status: DC | PRN

## 2021-02-17 MED ORDER — LEVOTHYROXINE SODIUM 75 MCG PO TABS: 75.0000 ug | ORAL_TABLET | Freq: Every day | ORAL | Status: DC

## 2021-02-17 NOTE — Progress Notes (Signed)
Patient completed dialysis treatment as ordered. 1 liter removal over 3 hours. Patient tolerated well. Report given to floor nurse Gerrie Nordmann. Owens Shark, RN.

## 2021-02-17 NOTE — Progress Notes (Signed)
[facility]  PROGRESS NOTE   Subjective:   Patient seen and evaluated during dialysis   HEMODIALYSIS FLOWSHEET:  Blood Flow Rate (mL/min): 400 mL/min Arterial Pressure (mmHg): -170 mmHg Venous Pressure (mmHg): 110 mmHg Transmembrane Pressure (mmHg): 60 mmHg Ultrafiltration Rate (mL/min): 500 mL/min Dialysate Flow Rate (mL/min): 500 ml/min Conductivity: Machine : 13.7 Conductivity: Machine : 13.7  No complaints during this time  Objective:  Vital signs in last 24 hours:  Temp:  [97.9 F (36.6 C)-98.9 F (37.2 C)] 97.9 F (36.6 C) (09/27 0845) Pulse Rate:  [68-88] 75 (09/27 1115) Resp:  [12-22] 16 (09/27 1115) BP: (102-159)/(27-65) 107/55 (09/27 1115) SpO2:  [97 %-100 %] 98 % (09/27 1000) Weight:  [95.6 kg] 95.6 kg (09/27 0500)  Weight change:  Filed Weights   02/12/21 0607 02/13/21 0550 02/17/21 0500  Weight: 98.6 kg 94.8 kg 95.6 kg    Intake/Output: I/O last 3 completed shifts: In: -  Out: 1750 [Urine:1750]   Intake/Output this shift:  No intake/output data recorded.  Physical Exam: General:  No acute distress  Head:  Normocephalic, atraumatic. Moist oral mucosal membranes  Eyes:  Anicteric  Lungs:   Clear to auscultation, normal effort  Heart:  S1S2 no rubs  Abdomen:   Soft, nontender, bowel sounds present  Extremities:  No peripheral edema.  Neurologic:  Awake, alert, following commands  Skin:  Red, macular rash right breast to back  Access: Rt Permcath     Basic Metabolic Panel: Recent Labs  Lab 02/12/21 0325 02/14/21 0446 02/15/21 0546 02/16/21 0412 02/17/21 0413  NA 136 134* 136 136 136  K 4.7 4.3 4.1 3.9 4.2  CL 101 97* 96* 98 99  CO2 27 28 29 29 30   GLUCOSE 120* 122* 111* 122* 173*  BUN 42* 39* 22 32* 36*  CREATININE 3.35* 2.77* 2.09* 2.65* 2.93*  CALCIUM 8.7* 8.6* 8.6* 8.7* 8.9  MG  --  1.9 1.8 1.9 2.1  PHOS  --  4.3  --   --   --      CBC: Recent Labs  Lab 02/11/21 0847 02/12/21 0325 02/13/21 0634  02/14/21 0446 02/15/21 0546 02/16/21 0412 02/17/21 0413  WBC 9.0   < > 6.4 6.4 5.7 5.2 5.2  NEUTROABS 6.6  --   --   --   --   --   --   HGB 9.8*   < > 9.5* 9.2* 9.1* 9.2* 10.4*  HCT 29.6*   < > 29.2* 28.2* 27.4* 28.0* 31.7*  MCV 96.1   < > 96.4 95.9 95.5 95.6 96.1  PLT 153   < > 137* 140* 147* 149* 159   < > = values in this interval not displayed.      Urinalysis: No results for input(s): COLORURINE, LABSPEC, PHURINE, GLUCOSEU, HGBUR, BILIRUBINUR, KETONESUR, PROTEINUR, UROBILINOGEN, NITRITE, LEUKOCYTESUR in the last 72 hours.  Invalid input(s): APPERANCEUR    Imaging: No results found.   Medications:     amiodarone  200 mg Oral Daily   Chlorhexidine Gluconate Cloth  6 each Topical Q0600   cholecalciferol  1,000 Units Oral Q breakfast   clopidogrel  75 mg Oral Q breakfast   ezetimibe  10 mg Oral QAC breakfast   ferrous sulfate  325 mg Oral BID WC   gabapentin  300 mg Oral QHS   heparin  5,000 Units Subcutaneous Q8H   levothyroxine  75 mcg Oral QAC breakfast   loratadine  10 mg Oral Daily   midodrine  5 mg  Oral TID WC   multivitamin  1 tablet Oral Daily   polyethylene glycol  17 g Oral Daily   rosuvastatin  40 mg Oral QHS   senna-docusate  2 tablet Oral BID    Assessment/ Plan:     87 year old male with history of diabetes, hypertension, coronary artery disease, congestive heart failure, CABG, ESRD on hemodialysis Saturday schedule.  He is now admitted with history of fall and right hip fracture.  Patient also found to have acute zoster.  Active Problems:   Pelvic fracture (HCC)   Pressure injury of skin    #1: ESRD: Patient has been on TTS schedule for dialysis.  Receiving dialysis today, tolerating well. Next treatment scheduled for Thursday. Planned discharge today after dialysis, no changes needed to outpatient clinic   #2: Anemia: secondary to chronic kidney disease.  Hgb 10.4, at goal  #3. Secondary Hyperparathyroidism:  Lab Results  Component Value  Date   CALCIUM 8.9 02/17/2021   PHOS 4.3 02/14/2021  Calcium and phosphorus at goal. Will continue to monitor   #4: Right hip fracture: Opted for medical management. Pain management    #5: Herpes zoster: Acyclovir. Airborne isolation discontinued today     LOS: 5 [facility] 9/27/202211:47 AM

## 2021-02-17 NOTE — Progress Notes (Signed)
Physical Therapy Treatment Patient Details Name: Matthew [name] MRN: [id number] DOB: December 12, 1930 Today's Date: 02/17/2021   History of Present Illness Matthew [name] is a 87 y.o. male who was here in early August with fall and R humeral fracture, went to rehab and now here with another fall and pelvic fracture.  Medical history significant for diabetes mellitus with complications of end-stage renal disease on hemodialysis (dialysis days are Tuesday/Thursday/Saturday), coronary artery disease status post CABG, hypertension.    PT Comments    Patient agreeable to PT. He was sitting up in the chair and reports feeling tired after his morning dialysis. Daughter in room for part of session. Patient required moderate assistance for sit to stand transfer from chair. Pre-gait activity performed in standing position with rolling walker for support. Ambulation efforts limited by pain and dizziness in standing. Continue to recommend SNF placement at discharge. PT will continue to follow.     Recommendations for follow up therapy are one component of a multi-disciplinary discharge planning process, led by the attending physician.  Recommendations may be updated based on patient status, additional functional criteria and insurance authorization.  Follow Up Recommendations  SNF;Supervision for mobility/OOB     Equipment Recommendations   (to be determined at next level of care)    Recommendations for Other Services       Precautions / Restrictions Precautions Precautions: Fall Restrictions Weight Bearing Restrictions: Yes RUE Weight Bearing: Weight bearing as tolerated RLE Weight Bearing: Weight bearing as tolerated     Mobility  Bed Mobility               General bed mobility comments: not addressed as patient sitting up in chair on arrival and post session    Transfers Overall transfer level: Needs assistance Equipment used: Rolling walker (2 wheeled) Transfers: Sit to/from  Stand Sit to Stand: Mod assist         General transfer comment: lifting and lowering assistance provided. verbal cues for technique, hand placement, weight shifting forward  Ambulation/Gait             General Gait Details: pre-gait activity performed in standing. facilitation for weight shifting provided. patient able to take one step forward and backward with RLE with difficulty. unable to advance ambulation further due to pain and mild dizziness reported in standing. Sp02 99% on room air and heart rate in the 90's after activity.   Stairs             Wheelchair Mobility    Modified Rankin (Stroke Patients Only)       Balance Overall balance assessment: Needs assistance Sitting-balance support: Feet supported Sitting balance-Leahy Scale: Good     Standing balance support: During functional activity Standing balance-Leahy Scale: Fair Standing balance comment: relying heavily on rolling walker for support in standing                            Cognition Arousal/Alertness: Awake/alert Behavior During Therapy: WFL for tasks assessed/performed Overall Cognitive Status: Within Functional Limits for tasks assessed                                        Exercises General Exercises - Lower Extremity Long Arc Quad: AROM;Strengthening;Both;20 reps;Seated Other Exercises Other Exercises: verbal cues for technique    General Comments  Pertinent Vitals/Pain Pain Assessment: 0-10 Pain Score: 7  Pain Location: right pelvic area Pain Descriptors / Indicators: Discomfort Pain Intervention(s): Repositioned;Premedicated before session;Ice applied    Home Living                      Prior Function            PT Goals (current goals can now be found in the care plan section) Acute Rehab PT Goals Patient Stated Goal: to walk again PT Goal Formulation: With patient Time For Goal Achievement: 02/25/21 Potential to  Achieve Goals: Fair Progress towards PT goals: Progressing toward goals    Frequency    Min 2X/week      PT Plan Current plan remains appropriate    Co-evaluation              AM-PAC PT "6 Clicks" Mobility   Outcome Measure  Help needed turning from your back to your side while in a flat bed without using bedrails?: A Lot Help needed moving from lying on your back to sitting on the side of a flat bed without using bedrails?: A Lot Help needed moving to and from a bed to a chair (including a wheelchair)?: A Lot Help needed standing up from a chair using your arms (e.g., wheelchair or bedside chair)?: A Lot Help needed to walk in hospital room?: A Lot Help needed climbing 3-5 steps with a railing? : Total 6 Click Score: 11    End of Session Equipment Utilized During Treatment: Gait belt Activity Tolerance: Patient limited by fatigue Patient left: in chair;with call bell/phone within reach;with chair alarm set;with family/visitor present Nurse Communication: Mobility status PT Visit Diagnosis: Muscle weakness (generalized) (M62.81);Difficulty in walking, not elsewhere classified (R26.2);Pain;Unsteadiness on feet (R26.81) Pain - Right/Left: Right Pain - part of body: Hip     Time: 8138-8719 PT Time Calculation (min) (ACUTE ONLY): 23 min  Charges:  $Therapeutic Activity: 23-37 mins                     Minna Merritts, PT, MPT    Percell Locus 02/17/2021, 2:21 PM

## 2021-02-17 NOTE — Discharge Summary (Addendum)
[facility] [place] NAME: Matthew [name]    MR#:  [id number]  DATE OF BIRTH:  March 12, 1931  DATE OF ADMISSION:  02/11/2021   ADMITTING PHYSICIAN: Richarda Osmond, MD  DATE OF DISCHARGE: 02/17/2021  PRIMARY CARE PHYSICIAN: Jodi Marble, MD   ADMISSION DIAGNOSIS:  Pelvic fracture ([place]) [S32.9XXA] Fall [W19.XXXA] Multiple closed fractures of pelvis without disruption of pelvic ring, initial encounter ([place]) [S32.82XA] DISCHARGE DIAGNOSIS:  Active Problems:   Pelvic fracture (HCC)   Pressure injury of skin  SECONDARY DIAGNOSIS:   Past Medical History:  Diagnosis Date   Anemia    Arthritis    Broken arm 05/2013   Left   CHF (congestive heart failure) (HCC)    Chronic kidney disease    Coronary artery disease    Diabetes mellitus without complication (HCC)    Dyspnea    DOE   Edema    FEET/LEGS OCCAS   Hypertension    Myocardial infarction ([place])    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  Orthopedic recommended conservative management  Right pubic rami fracture status post fall present on admission Nonoperative management per Ortho Weight-bear as tolerated for right lower extremity and outpatient follow-up with Ortho in 2 to 3 weeks  Recent right humerus fracture Orthopedic recommends use of walker with right upper extremity  ESRD on hemodialysis (TTS) CAD status post CABG: Stable on medicines Chronic combined CHF: Well compensated at this time Healed herpes zoster on the right side of chest -no active lesions -no treatment or airborne precaution needed Continue gabapentin for postherpetic neuralgia  symptoms Hypothyroidism: On Synthroid Type 2 diabetes with hemoglobin A1c of 6.3 Anemia of chronic kidney disease: Stable H&H History of orthostatic hypotension: On midodrine 5 mg p.o. 3 times daily Stage II pressure ulcer on the right buttock present on admission Xeroform gauze which is a wound contact layer topped with a dry 2 x 2 and covered with silicone foam dressing.  Patient to be turned from side to side and supine position minimize.  Heel pressure injury prevention to be accomplished with pressure redistribution heel boots/Prevalon  Pressure Injury 02/15/21 Buttocks Right Stage 2 -  Partial thickness loss of dermis presenting as a shallow open injury with a red, pink wound bed without slough. (Active)  02/15/21 1556  Location: Buttocks  Location Orientation: Right  Staging: Stage 2 -  Partial thickness loss of dermis presenting as a shallow open injury with a red, pink wound bed without slough.  Wound Description (Comments):   Present on Admission: Yes   DISCHARGE CONDITIONS:  Stable CONSULTS OBTAINED:   DRUG ALLERGIES:   Allergies  Allergen Reactions   Other     Other reaction(s): Myalgias (intolerance), Other (See Comments) Statin Drugs  Statin Drugs     Amoxicillin Diarrhea   DISCHARGE MEDICATIONS:   Allergies as of 02/17/2021       Reactions   Other    Other reaction(s): Myalgias (intolerance), Other (See Comments) Statin Drugs  Statin Drugs    Amoxicillin Diarrhea        Medication List     STOP taking  these medications    insulin aspart 100 UNIT/ML injection Commonly known as: novoLOG   valACYclovir 1000 MG tablet Commonly known as: VALTREX       TAKE these medications    acyclovir ointment 5 % Commonly known as: ZOVIRAX Apply 1 application topically daily as needed (shingles). Notes to patient: Not given while in hospital.    amiodarone 200 MG tablet Commonly known as: PACERONE Take 200 mg by mouth daily. Notes to patient: Last dose  given 02/17/2021 at 0758am   bisacodyl 10 MG suppository Commonly known as: DULCOLAX Place 1 suppository (10 mg total) rectally daily as needed for moderate constipation. Notes to patient: Not given while in hospital.    cholecalciferol 25 MCG (1000 UNIT) tablet Commonly known as: VITAMIN D Take 1,000 Units by mouth daily with breakfast. Notes to patient: Last dose given 02/17/2021 at 0758am   clopidogrel 75 MG tablet Commonly known as: PLAVIX Take 1 tablet (75 mg total) by mouth daily with breakfast. Notes to patient: Last dose given 02/17/2021 at 0758am   ezetimibe 10 MG tablet Commonly known as: ZETIA Take 10 mg by mouth daily before breakfast. Notes to patient: Last dose given 02/16/2021 at 0950am   ferrous sulfate 325 (65 FE) MG tablet Take 325 mg by mouth 2 (two) times daily with a meal. Notes to patient: Last dose given 02/17/2021 at 0758am   Fish Oil 1000 MG Caps Take 4,000 mg by mouth daily with lunch. Notes to patient: Not given while in hospital.    fluticasone 50 MCG/ACT nasal spray Commonly known as: FLONASE Place 2 sprays into both nostrils daily as needed for allergies or rhinitis. Notes to patient: Not given while in hospital.    furosemide 40 MG tablet Commonly known as: LASIX Take 40 mg by mouth. Notes to patient: Not given while in hospital.    gabapentin 300 MG capsule Commonly known as: NEURONTIN Take 1 capsule (300 mg total) by mouth at bedtime. What changed: when to take this Notes to patient: Last dose given 02/16/2021 at 922pm   glipiZIDE 5 MG tablet Commonly known as: GLUCOTROL Take 2.5 mg by mouth daily at 12 noon. Notes to patient: Not given while in hospital.   HYDROcodone-acetaminophen 5-325 MG tablet Commonly known as: NORCO/VICODIN Take 1 tablet by mouth every 6 (six) hours as needed for severe pain.   levocetirizine 5 MG tablet Commonly known as: XYZAL Take 10 mg by mouth every morning. Notes to patient: Not given while in  hospital.    levothyroxine 75 MCG tablet Commonly known as: SYNTHROID Take 1 tablet (75 mcg total) by mouth daily before breakfast. Start taking on: February 18, 2021 What changed:  medication strength how much to take Notes to patient: Last dose given 02/17/2021 at 0503am   metoprolol succinate 25 MG 24 hr tablet Commonly known as: TOPROL-XL Take 0.5 tablets (12.5 mg total) by mouth at bedtime. Notes to patient: Not given while in hospital.    midodrine 5 MG tablet Commonly known as: PROAMATINE Take 1 tablet (5 mg total) by mouth 3 (three) times daily with meals. What changed:  medication strength how much to take when to take this Notes to patient: Last dose given 02/17/2021 at 0758am   multivitamin Tabs tablet Take 1 tablet by mouth daily. Notes to patient: Last dose given 02/16/2021 at 0951am   nitroGLYCERIN 0.4 MG SL tablet Commonly known as: NITROSTAT Place 0.4 mg under the tongue every 5 (five) minutes as needed for chest pain. Notes  to patient: Not given while in hospital.    polyethylene glycol 17 g packet Commonly known as: MIRALAX / GLYCOLAX Take 17 g by mouth daily. Notes to patient: Last dose given 02/16/2021 at 0951am   potassium chloride SA 20 MEQ tablet Commonly known as: KLOR-CON Take 20 mEq by mouth daily. Notes to patient: Not given while in hospital.    rosuvastatin 40 MG tablet Commonly known as: CRESTOR Take 40 mg by mouth at bedtime. Notes to patient: Last dose given 02/16/2021 at 922pm   senna-docusate 8.6-50 MG tablet Commonly known as: Senokot-S Take 2 tablets by mouth 2 (two) times daily. Notes to patient: Last dose given 02/17/2021 at 0757am   traMADol 50 MG tablet Commonly known as: ULTRAM Take 1 tablet (50 mg total) by mouth every 6 (six) hours as needed for moderate pain. Notes to patient: Last dose given 02/17/2021 at 0759am               Discharge Care Instructions  (From admission, onward)           Start      Ordered   02/17/21 0000  Discharge wound care:       Comments: AS ABOVE   02/17/21 1048           DISCHARGE INSTRUCTIONS:   DIET:  Diabetic diet DISCHARGE CONDITION:  Stable ACTIVITY:  Weight-bear as tolerated on right lower extremity.  Okay for use of walker with right upper extremity. OXYGEN:  Home Oxygen: No.  Oxygen Delivery: room air DISCHARGE LOCATION:  nursing home   If you experience worsening of your admission symptoms, develop shortness of breath, life threatening emergency, suicidal or homicidal thoughts you must seek medical attention immediately by calling 911 or calling your MD immediately  if symptoms less severe.  You Must read complete instructions/literature along with all the possible adverse reactions/side effects for all the Medicines you take and that have been prescribed to you. Take any new Medicines after you have completely understood and accpet all the possible adverse reactions/side effects.   Please note  You were cared for by a hospitalist during your hospital stay. If you have any questions about your discharge medications or the care you received while you were in the hospital after you are discharged, you can call the unit and asked to speak with the hospitalist on call if the hospitalist that took care of you is not available. Once you are discharged, your primary care physician will handle any further medical issues. Please note that NO REFILLS for any discharge medications will be authorized once you are discharged, as it is imperative that you return to your primary care physician (or establish a relationship with a primary care physician if you do not have one) for your aftercare needs so that they can reassess your need for medications and monitor your lab values.    On the day of Discharge:  VITAL SIGNS:  Blood pressure (!) 109/56, pulse 72, temperature 98.5 F (36.9 C), temperature source Oral, resp. rate 15, height 6' (1.829 m), weight  95.6 kg, SpO2 98 %. PHYSICAL EXAMINATION:  GENERAL:  87 y.o.-year-old patient lying in the bed with no acute distress.  EYES: Pupils equal, round, reactive to light and accommodation. No scleral icterus. Extraocular muscles intact.  HEENT: Head atraumatic, normocephalic. Oropharynx and nasopharynx clear.  NECK:  Supple, no jugular venous distention. No thyroid enlargement, no tenderness.  LUNGS: Normal breath sounds bilaterally, no wheezing, rales,rhonchi or crepitation. No use of  accessory muscles of respiration.  CARDIOVASCULAR: S1, S2 normal. No murmurs, rubs, or gallops.  ABDOMEN: Soft, non-tender, non-distended. Bowel sounds present. No organomegaly or mass.  EXTREMITIES: No pedal edema, cyanosis, or clubbing.  NEUROLOGIC: Cranial nerves II through XII are intact. Muscle strength 5/5 in all extremities. Sensation intact. Gait not checked.  PSYCHIATRIC: The patient is alert and oriented x 3.  SKIN: Pressure ulcer as documented above   DATA REVIEW:   CBC Recent Labs  Lab 02/17/21 0413  WBC 5.2  HGB 10.4*  HCT 31.7*  PLT 159    Chemistries  Recent Labs  Lab 02/11/21 0847 02/12/21 0325 02/17/21 0413  NA 140   < > 136  K 4.1   < > 4.2  CL 100   < > 99  CO2 31   < > 30  GLUCOSE 109*   < > 173*  BUN 36*   < > 36*  CREATININE 2.96*   < > 2.93*  CALCIUM 8.7*   < > 8.9  MG  --    < > 2.1  AST 33  --   --   ALT 27  --   --   ALKPHOS 149*  --   --   BILITOT 1.0  --   --    < > = values in this interval not displayed.     Outpatient follow-up  Contact information for follow-up providers     Jodi Marble, MD. Schedule an appointment as soon as possible for a visit in 1 week(s).   Specialty: Internal Medicine Why: [facility] Discharge F/UP Contact information: [place] [address] [phone]         Renee Harder, MD. Schedule an appointment as soon as possible for a visit in 2 week(s).   Specialty: Orthopedic Surgery Why: [facility] Discharge F/UP Contact information: [place] [address] [phone]              Contact information for after-discharge care     [facility] Preferred SNF .   Service: Skilled Nursing Contact information: [place] [facility] [facility] [phone]                     30 Day Unplanned Readmission Risk Score    Flowsheet Row ED to Hosp-Admission (Current) from 02/11/2021 in [place]  30 Day Unplanned Readmission Risk Score (%) 30.03 Filed at 02/17/2021 0801       This score is the patient's risk of an unplanned readmission within 30 days of being discharged (0 -100%). The score is based on dignosis, age, lab data, medications, orders, and past utilization.   Low:  0-14.9   Medium: 15-21.9   High: 22-29.9   Extreme: 30 and above          Management plans discussed with the patient, family and they are in agreement.  CODE STATUS: DNR   TOTAL TIME TAKING CARE OF THIS PATIENT: 45 minutes.    Max Sane M.D on 02/17/2021 at 1:01 PM  [facility]   CC: Primary care physician; Jodi Marble, MD   Note: This dictation was prepared with Dragon dictation along with smaller phrase technology. Any transcriptional errors that result from this process are unintentional.

## 2021-02-17 NOTE — Progress Notes (Signed)
Pt d/c to [facility] this afternoon via [facility].  IV removed from pts L hand without issue.  All personal belongings were taken by daughter prior to pt leaving.  Report was called earlier today and given to [place], Therapist, sports at [facility].  NAD noted VSS.

## 2021-02-17 NOTE — TOC Progression Note (Signed)
Transition of Care [facility]) - Progression Note    Patient Details  Name: Matthew [name] MRN: [id number] Date of Birth: Dec 10, 1930  Transition of Care [facility]) CM/SW [place], RN Phone Number: 02/17/2021, 11:57 AM  Clinical Narrative:   As per Magda Paganini at [facility], patient can go to room 502 today.  EMS will transport at 4pm today.    Expected Discharge Plan: Skilled Nursing Facility Barriers to Discharge: Continued Medical Work up  Expected Discharge Plan and Services Expected Discharge Plan: [place]         Expected Discharge Date: 02/17/21                                     Social Determinants of Health (SDOH) Interventions    Readmission Risk Interventions Readmission Risk Prevention Plan 02/12/2021  Transportation Screening Complete  Medication Review [employer]) Complete  SW Recovery Care/Counseling Consult Complete  Skilled Nursing Facility Complete  Some recent data might be hidden

## 2021-02-17 NOTE — Plan of Care (Signed)
  Problem: Clinical Measurements: Goal: Ability to maintain clinical measurements within normal limits will improve Outcome: Progressing   Problem: Clinical Measurements: Goal: Will remain free from infection Outcome: Progressing   Problem: Clinical Measurements: Goal: Diagnostic test results will improve Outcome: Progressing   

## 2021-02-17 NOTE — Progress Notes (Signed)
PT Cancellation Note  Patient Details Name: DAT DERKSEN MRN: [id number] DOB: May 04, 1931   Cancelled Treatment:    Reason Eval/Treat Not Completed: Patient at procedure or test/unavailable. Patient is off the floor. PT will continue with attempts.   Minna Merritts, PT, MPT  Percell Locus 02/17/2021, 9:07 AM

## 2021-02-17 NOTE — Care Management Important Message (Signed)
Important Message  Patient Details  Name: Matthew [name] MRN: [id number] Date of Birth: [birth year]   Medicare Important Message Given:  Other (see comment) (spoke with daughter Vinnie Langton, no additional copy needed.)     Tommy Medal 02/17/2021, 11:21 AM

## 2021-02-18 ENCOUNTER — Other Ambulatory Visit: Payer: Self-pay | Admitting: Internal Medicine

## 2021-02-18 DIAGNOSIS — M8000XD Age-related osteoporosis with current pathological fracture, unspecified site, subsequent encounter for fracture with routine healing: Secondary | ICD-10-CM | POA: Insufficient documentation

## 2021-02-18 DIAGNOSIS — I5042 Chronic combined systolic (congestive) and diastolic (congestive) heart failure: Secondary | ICD-10-CM | POA: Diagnosis not present

## 2021-02-18 DIAGNOSIS — N186 End stage renal disease: Secondary | ICD-10-CM | POA: Diagnosis not present

## 2021-02-18 DIAGNOSIS — I2510 Atherosclerotic heart disease of native coronary artery without angina pectoris: Secondary | ICD-10-CM | POA: Diagnosis not present

## 2021-02-18 MED ORDER — HYDROCODONE-ACETAMINOPHEN 5-325 MG PO TABS: 1.0000 | ORAL_TABLET | Freq: Four times a day (QID) | ORAL | 0 refills | Status: AC | PRN

## 2021-02-18 MED ORDER — HYDROCODONE-ACETAMINOPHEN 5-325 MG PO TABS: 1.0000 | ORAL_TABLET | Freq: Four times a day (QID) | ORAL | 0 refills | Status: DC | PRN

## 2021-02-19 DIAGNOSIS — N186 End stage renal disease: Secondary | ICD-10-CM | POA: Diagnosis not present

## 2021-02-19 DIAGNOSIS — N2581 Secondary hyperparathyroidism of renal origin: Secondary | ICD-10-CM | POA: Diagnosis not present

## 2021-02-19 DIAGNOSIS — Z992 Dependence on renal dialysis: Secondary | ICD-10-CM | POA: Diagnosis not present

## 2021-02-19 NOTE — Progress Notes (Addendum)
Unfortunately, Patient never got the Norco prescription at D/C (I had sent electronically and had printed-signed copy for him).   His facility [facility]) would not give him any pain meds so family came back y'day afternoon to get new script. I tried my best and spent 30-40 mins to get him this but [facility] won't allow me to print/send new scripts as he's no longer here. Hospital doesn't keep physical blank script.  Hospital pharmacist was finally able to procure one blank physical script at the end of day. I've written and script has been faxed over this am to [facility]

## 2021-02-20 DIAGNOSIS — N186 End stage renal disease: Secondary | ICD-10-CM | POA: Diagnosis not present

## 2021-02-20 DIAGNOSIS — Z992 Dependence on renal dialysis: Secondary | ICD-10-CM | POA: Diagnosis not present

## 2021-02-21 DIAGNOSIS — Z992 Dependence on renal dialysis: Secondary | ICD-10-CM | POA: Diagnosis not present

## 2021-02-21 DIAGNOSIS — N2581 Secondary hyperparathyroidism of renal origin: Secondary | ICD-10-CM | POA: Diagnosis not present

## 2021-02-21 DIAGNOSIS — N186 End stage renal disease: Secondary | ICD-10-CM | POA: Diagnosis not present

## 2021-02-23 DIAGNOSIS — E663 Overweight: Secondary | ICD-10-CM | POA: Diagnosis not present

## 2021-02-23 DIAGNOSIS — Z9861 Coronary angioplasty status: Secondary | ICD-10-CM | POA: Diagnosis not present

## 2021-02-23 DIAGNOSIS — E785 Hyperlipidemia, unspecified: Secondary | ICD-10-CM | POA: Diagnosis not present

## 2021-02-23 DIAGNOSIS — I2510 Atherosclerotic heart disease of native coronary artery without angina pectoris: Secondary | ICD-10-CM | POA: Diagnosis not present

## 2021-02-23 DIAGNOSIS — I10 Essential (primary) hypertension: Secondary | ICD-10-CM | POA: Diagnosis not present

## 2021-02-23 DIAGNOSIS — I25810 Atherosclerosis of coronary artery bypass graft(s) without angina pectoris: Secondary | ICD-10-CM | POA: Diagnosis not present

## 2021-02-24 DIAGNOSIS — N186 End stage renal disease: Secondary | ICD-10-CM | POA: Diagnosis not present

## 2021-02-24 DIAGNOSIS — N2581 Secondary hyperparathyroidism of renal origin: Secondary | ICD-10-CM | POA: Diagnosis not present

## 2021-02-24 DIAGNOSIS — Z992 Dependence on renal dialysis: Secondary | ICD-10-CM | POA: Diagnosis not present

## 2021-02-26 DIAGNOSIS — N186 End stage renal disease: Secondary | ICD-10-CM | POA: Diagnosis not present

## 2021-02-26 DIAGNOSIS — N2581 Secondary hyperparathyroidism of renal origin: Secondary | ICD-10-CM | POA: Diagnosis not present

## 2021-02-26 DIAGNOSIS — Z992 Dependence on renal dialysis: Secondary | ICD-10-CM | POA: Diagnosis not present

## 2021-02-28 DIAGNOSIS — Z992 Dependence on renal dialysis: Secondary | ICD-10-CM | POA: Diagnosis not present

## 2021-02-28 DIAGNOSIS — N2581 Secondary hyperparathyroidism of renal origin: Secondary | ICD-10-CM | POA: Diagnosis not present

## 2021-02-28 DIAGNOSIS — N186 End stage renal disease: Secondary | ICD-10-CM | POA: Diagnosis not present

## 2021-03-02 DIAGNOSIS — S32511A Fracture of superior rim of right pubis, initial encounter for closed fracture: Secondary | ICD-10-CM | POA: Diagnosis not present

## 2021-03-02 DIAGNOSIS — M25511 Pain in right shoulder: Secondary | ICD-10-CM | POA: Diagnosis not present

## 2021-03-03 DIAGNOSIS — Z992 Dependence on renal dialysis: Secondary | ICD-10-CM | POA: Diagnosis not present

## 2021-03-03 DIAGNOSIS — N2581 Secondary hyperparathyroidism of renal origin: Secondary | ICD-10-CM | POA: Diagnosis not present

## 2021-03-03 DIAGNOSIS — N186 End stage renal disease: Secondary | ICD-10-CM | POA: Diagnosis not present

## 2021-03-05 DIAGNOSIS — N2581 Secondary hyperparathyroidism of renal origin: Secondary | ICD-10-CM | POA: Diagnosis not present

## 2021-03-05 DIAGNOSIS — Z992 Dependence on renal dialysis: Secondary | ICD-10-CM | POA: Diagnosis not present

## 2021-03-05 DIAGNOSIS — N186 End stage renal disease: Secondary | ICD-10-CM | POA: Diagnosis not present

## 2021-03-07 DIAGNOSIS — N186 End stage renal disease: Secondary | ICD-10-CM | POA: Diagnosis not present

## 2021-03-07 DIAGNOSIS — N2581 Secondary hyperparathyroidism of renal origin: Secondary | ICD-10-CM | POA: Diagnosis not present

## 2021-03-07 DIAGNOSIS — Z992 Dependence on renal dialysis: Secondary | ICD-10-CM | POA: Diagnosis not present

## 2021-03-10 DIAGNOSIS — N2581 Secondary hyperparathyroidism of renal origin: Secondary | ICD-10-CM | POA: Diagnosis not present

## 2021-03-10 DIAGNOSIS — Z992 Dependence on renal dialysis: Secondary | ICD-10-CM | POA: Diagnosis not present

## 2021-03-10 DIAGNOSIS — N186 End stage renal disease: Secondary | ICD-10-CM | POA: Diagnosis not present

## 2021-03-12 DIAGNOSIS — N186 End stage renal disease: Secondary | ICD-10-CM | POA: Diagnosis not present

## 2021-03-12 DIAGNOSIS — N2581 Secondary hyperparathyroidism of renal origin: Secondary | ICD-10-CM | POA: Diagnosis not present

## 2021-03-12 DIAGNOSIS — Z992 Dependence on renal dialysis: Secondary | ICD-10-CM | POA: Diagnosis not present

## 2021-03-14 DIAGNOSIS — N2581 Secondary hyperparathyroidism of renal origin: Secondary | ICD-10-CM | POA: Diagnosis not present

## 2021-03-14 DIAGNOSIS — Z992 Dependence on renal dialysis: Secondary | ICD-10-CM | POA: Diagnosis not present

## 2021-03-14 DIAGNOSIS — Z23 Encounter for immunization: Secondary | ICD-10-CM | POA: Diagnosis not present

## 2021-03-14 DIAGNOSIS — N186 End stage renal disease: Secondary | ICD-10-CM | POA: Diagnosis not present

## 2021-03-17 DIAGNOSIS — F1090 Alcohol use, unspecified, uncomplicated: Secondary | ICD-10-CM | POA: Diagnosis not present

## 2021-03-17 DIAGNOSIS — Z992 Dependence on renal dialysis: Secondary | ICD-10-CM | POA: Diagnosis not present

## 2021-03-17 DIAGNOSIS — K219 Gastro-esophageal reflux disease without esophagitis: Secondary | ICD-10-CM | POA: Diagnosis not present

## 2021-03-17 DIAGNOSIS — N186 End stage renal disease: Secondary | ICD-10-CM | POA: Diagnosis not present

## 2021-03-17 DIAGNOSIS — K7460 Unspecified cirrhosis of liver: Secondary | ICD-10-CM | POA: Diagnosis not present

## 2021-03-17 DIAGNOSIS — D631 Anemia in chronic kidney disease: Secondary | ICD-10-CM | POA: Diagnosis not present

## 2021-03-17 DIAGNOSIS — E039 Hypothyroidism, unspecified: Secondary | ICD-10-CM | POA: Diagnosis not present

## 2021-03-17 DIAGNOSIS — S3282XD Multiple fractures of pelvis without disruption of pelvic ring, subsequent encounter for fracture with routine healing: Secondary | ICD-10-CM | POA: Diagnosis not present

## 2021-03-17 DIAGNOSIS — I480 Paroxysmal atrial fibrillation: Secondary | ICD-10-CM | POA: Diagnosis not present

## 2021-03-17 DIAGNOSIS — B028 Zoster with other complications: Secondary | ICD-10-CM | POA: Diagnosis not present

## 2021-03-17 DIAGNOSIS — J309 Allergic rhinitis, unspecified: Secondary | ICD-10-CM | POA: Diagnosis not present

## 2021-03-17 DIAGNOSIS — M6281 Muscle weakness (generalized): Secondary | ICD-10-CM | POA: Diagnosis not present

## 2021-03-17 DIAGNOSIS — I712 Thoracic aortic aneurysm, without rupture, unspecified: Secondary | ICD-10-CM | POA: Diagnosis not present

## 2021-03-17 DIAGNOSIS — M1990 Unspecified osteoarthritis, unspecified site: Secondary | ICD-10-CM | POA: Diagnosis not present

## 2021-03-17 DIAGNOSIS — E7800 Pure hypercholesterolemia, unspecified: Secondary | ICD-10-CM | POA: Diagnosis not present

## 2021-03-17 DIAGNOSIS — I2510 Atherosclerotic heart disease of native coronary artery without angina pectoris: Secondary | ICD-10-CM | POA: Diagnosis not present

## 2021-03-17 DIAGNOSIS — I69398 Other sequelae of cerebral infarction: Secondary | ICD-10-CM | POA: Diagnosis not present

## 2021-03-17 DIAGNOSIS — I132 Hypertensive heart and chronic kidney disease with heart failure and with stage 5 chronic kidney disease, or end stage renal disease: Secondary | ICD-10-CM | POA: Diagnosis not present

## 2021-03-17 DIAGNOSIS — W19XXXD Unspecified fall, subsequent encounter: Secondary | ICD-10-CM | POA: Diagnosis not present

## 2021-03-17 DIAGNOSIS — N2581 Secondary hyperparathyroidism of renal origin: Secondary | ICD-10-CM | POA: Diagnosis not present

## 2021-03-17 DIAGNOSIS — E1122 Type 2 diabetes mellitus with diabetic chronic kidney disease: Secondary | ICD-10-CM | POA: Diagnosis not present

## 2021-03-17 DIAGNOSIS — I951 Orthostatic hypotension: Secondary | ICD-10-CM | POA: Diagnosis not present

## 2021-03-17 DIAGNOSIS — D61818 Other pancytopenia: Secondary | ICD-10-CM | POA: Diagnosis not present

## 2021-03-17 DIAGNOSIS — M810 Age-related osteoporosis without current pathological fracture: Secondary | ICD-10-CM | POA: Diagnosis not present

## 2021-03-17 DIAGNOSIS — I252 Old myocardial infarction: Secondary | ICD-10-CM | POA: Diagnosis not present

## 2021-03-17 DIAGNOSIS — I5042 Chronic combined systolic (congestive) and diastolic (congestive) heart failure: Secondary | ICD-10-CM | POA: Diagnosis not present

## 2021-03-18 DIAGNOSIS — I2510 Atherosclerotic heart disease of native coronary artery without angina pectoris: Secondary | ICD-10-CM | POA: Diagnosis not present

## 2021-03-18 DIAGNOSIS — I340 Nonrheumatic mitral (valve) insufficiency: Secondary | ICD-10-CM | POA: Diagnosis not present

## 2021-03-18 DIAGNOSIS — I10 Essential (primary) hypertension: Secondary | ICD-10-CM | POA: Diagnosis not present

## 2021-03-18 DIAGNOSIS — I4891 Unspecified atrial fibrillation: Secondary | ICD-10-CM | POA: Diagnosis not present

## 2021-03-18 DIAGNOSIS — I509 Heart failure, unspecified: Secondary | ICD-10-CM | POA: Diagnosis not present

## 2021-03-18 DIAGNOSIS — E785 Hyperlipidemia, unspecified: Secondary | ICD-10-CM | POA: Diagnosis not present

## 2021-03-19 DIAGNOSIS — N2581 Secondary hyperparathyroidism of renal origin: Secondary | ICD-10-CM | POA: Diagnosis not present

## 2021-03-19 DIAGNOSIS — Z992 Dependence on renal dialysis: Secondary | ICD-10-CM | POA: Diagnosis not present

## 2021-03-19 DIAGNOSIS — N186 End stage renal disease: Secondary | ICD-10-CM | POA: Diagnosis not present

## 2021-03-21 DIAGNOSIS — N186 End stage renal disease: Secondary | ICD-10-CM | POA: Diagnosis not present

## 2021-03-21 DIAGNOSIS — Z992 Dependence on renal dialysis: Secondary | ICD-10-CM | POA: Diagnosis not present

## 2021-03-21 DIAGNOSIS — N2581 Secondary hyperparathyroidism of renal origin: Secondary | ICD-10-CM | POA: Diagnosis not present

## 2021-03-23 DIAGNOSIS — I2510 Atherosclerotic heart disease of native coronary artery without angina pectoris: Secondary | ICD-10-CM | POA: Diagnosis not present

## 2021-03-23 DIAGNOSIS — N186 End stage renal disease: Secondary | ICD-10-CM | POA: Diagnosis not present

## 2021-03-23 DIAGNOSIS — Z992 Dependence on renal dialysis: Secondary | ICD-10-CM | POA: Diagnosis not present

## 2021-03-23 DIAGNOSIS — E785 Hyperlipidemia, unspecified: Secondary | ICD-10-CM | POA: Diagnosis not present

## 2021-03-23 DIAGNOSIS — I509 Heart failure, unspecified: Secondary | ICD-10-CM | POA: Diagnosis not present

## 2021-03-23 DIAGNOSIS — I10 Essential (primary) hypertension: Secondary | ICD-10-CM | POA: Diagnosis not present

## 2021-03-23 DIAGNOSIS — I25810 Atherosclerosis of coronary artery bypass graft(s) without angina pectoris: Secondary | ICD-10-CM | POA: Diagnosis not present

## 2021-03-23 DIAGNOSIS — I4891 Unspecified atrial fibrillation: Secondary | ICD-10-CM | POA: Diagnosis not present

## 2021-03-23 DIAGNOSIS — E663 Overweight: Secondary | ICD-10-CM | POA: Diagnosis not present

## 2021-03-23 DIAGNOSIS — Z9861 Coronary angioplasty status: Secondary | ICD-10-CM | POA: Diagnosis not present

## 2021-03-23 DIAGNOSIS — I340 Nonrheumatic mitral (valve) insufficiency: Secondary | ICD-10-CM | POA: Diagnosis not present

## 2021-03-24 DIAGNOSIS — D631 Anemia in chronic kidney disease: Secondary | ICD-10-CM | POA: Diagnosis not present

## 2021-03-24 DIAGNOSIS — Z992 Dependence on renal dialysis: Secondary | ICD-10-CM | POA: Diagnosis not present

## 2021-03-24 DIAGNOSIS — N186 End stage renal disease: Secondary | ICD-10-CM | POA: Diagnosis not present

## 2021-03-25 DIAGNOSIS — I351 Nonrheumatic aortic (valve) insufficiency: Secondary | ICD-10-CM | POA: Diagnosis not present

## 2021-03-25 DIAGNOSIS — I2510 Atherosclerotic heart disease of native coronary artery without angina pectoris: Secondary | ICD-10-CM | POA: Diagnosis not present

## 2021-03-25 DIAGNOSIS — E1122 Type 2 diabetes mellitus with diabetic chronic kidney disease: Secondary | ICD-10-CM | POA: Diagnosis not present

## 2021-03-25 DIAGNOSIS — I10 Essential (primary) hypertension: Secondary | ICD-10-CM | POA: Diagnosis not present

## 2021-03-25 DIAGNOSIS — B029 Zoster without complications: Secondary | ICD-10-CM | POA: Diagnosis not present

## 2021-03-25 DIAGNOSIS — G4700 Insomnia, unspecified: Secondary | ICD-10-CM | POA: Diagnosis not present

## 2021-03-25 DIAGNOSIS — L89159 Pressure ulcer of sacral region, unspecified stage: Secondary | ICD-10-CM | POA: Diagnosis not present

## 2021-03-25 DIAGNOSIS — D649 Anemia, unspecified: Secondary | ICD-10-CM | POA: Diagnosis not present

## 2021-03-25 DIAGNOSIS — E785 Hyperlipidemia, unspecified: Secondary | ICD-10-CM | POA: Diagnosis not present

## 2021-03-25 DIAGNOSIS — E032 Hypothyroidism due to medicaments and other exogenous substances: Secondary | ICD-10-CM | POA: Diagnosis not present

## 2021-03-25 DIAGNOSIS — I498 Other specified cardiac arrhythmias: Secondary | ICD-10-CM | POA: Diagnosis not present

## 2021-03-25 DIAGNOSIS — I4891 Unspecified atrial fibrillation: Secondary | ICD-10-CM | POA: Diagnosis not present

## 2021-03-26 DIAGNOSIS — D631 Anemia in chronic kidney disease: Secondary | ICD-10-CM | POA: Diagnosis not present

## 2021-03-26 DIAGNOSIS — N186 End stage renal disease: Secondary | ICD-10-CM | POA: Diagnosis not present

## 2021-03-26 DIAGNOSIS — Z992 Dependence on renal dialysis: Secondary | ICD-10-CM | POA: Diagnosis not present

## 2021-03-28 DIAGNOSIS — Z992 Dependence on renal dialysis: Secondary | ICD-10-CM | POA: Diagnosis not present

## 2021-03-28 DIAGNOSIS — N186 End stage renal disease: Secondary | ICD-10-CM | POA: Diagnosis not present

## 2021-03-28 DIAGNOSIS — D631 Anemia in chronic kidney disease: Secondary | ICD-10-CM | POA: Diagnosis not present

## 2021-03-31 DIAGNOSIS — D631 Anemia in chronic kidney disease: Secondary | ICD-10-CM | POA: Diagnosis not present

## 2021-03-31 DIAGNOSIS — N186 End stage renal disease: Secondary | ICD-10-CM | POA: Diagnosis not present

## 2021-03-31 DIAGNOSIS — Z992 Dependence on renal dialysis: Secondary | ICD-10-CM | POA: Diagnosis not present

## 2021-04-02 DIAGNOSIS — D631 Anemia in chronic kidney disease: Secondary | ICD-10-CM | POA: Diagnosis not present

## 2021-04-02 DIAGNOSIS — N186 End stage renal disease: Secondary | ICD-10-CM | POA: Diagnosis not present

## 2021-04-02 DIAGNOSIS — Z992 Dependence on renal dialysis: Secondary | ICD-10-CM | POA: Diagnosis not present

## 2021-04-04 DIAGNOSIS — N186 End stage renal disease: Secondary | ICD-10-CM | POA: Diagnosis not present

## 2021-04-04 DIAGNOSIS — Z992 Dependence on renal dialysis: Secondary | ICD-10-CM | POA: Diagnosis not present

## 2021-04-04 DIAGNOSIS — D631 Anemia in chronic kidney disease: Secondary | ICD-10-CM | POA: Diagnosis not present

## 2021-04-06 ENCOUNTER — Encounter (INDEPENDENT_AMBULATORY_CARE_PROVIDER_SITE_OTHER): Payer: Self-pay | Admitting: Nurse Practitioner

## 2021-04-06 ENCOUNTER — Other Ambulatory Visit: Payer: Self-pay

## 2021-04-06 ENCOUNTER — Ambulatory Visit (INDEPENDENT_AMBULATORY_CARE_PROVIDER_SITE_OTHER): Payer: Medicare Other | Admitting: Nurse Practitioner

## 2021-04-06 VITALS — BP 102/62 | HR 92 | Ht 72.0 in | Wt 213.0 lb

## 2021-04-06 DIAGNOSIS — E785 Hyperlipidemia, unspecified: Secondary | ICD-10-CM

## 2021-04-06 DIAGNOSIS — N186 End stage renal disease: Secondary | ICD-10-CM | POA: Diagnosis not present

## 2021-04-06 NOTE — H&P (View-Only) (Signed)
Subjective:    Patient ID: Matthew [name], male    DOB: 1930/07/05, 87 y.o.   MRN: [id number] Chief Complaint  Patient presents with   Follow-up    Consult ( v- mapping done 7/22)    Matthew [name] is an 87 year old male that is seen for evaluation for dialysis access.  The patient has recently began hemodialysis with utilization of a PermCath.  He denies any issues with his PermCath currently.  The patient is right-handed.   The patient has been considering the various methods of dialysis and wishes to proceed with hemodialysis and therefore creation of AV access.   The patient denies amaurosis fugax or recent TIA symptoms. There are no recent neurological changes noted. The patient denies claudication symptoms or rest pain symptoms. The patient denies history of DVT, PE or superficial thrombophlebitis. The patient denies recent episodes of angina or shortness of breath.     Noninvasive studies show adequate access for creation of a left brachial axillary AV graft.   Review of Systems  Musculoskeletal:  Positive for gait problem.  Neurological:  Positive for weakness.  All other systems reviewed and are negative.     Objective:   Physical Exam Vitals reviewed.  HENT:     Head: Normocephalic.  Cardiovascular:     Rate and Rhythm: Normal rate.     Pulses: Normal pulses.  Pulmonary:     Effort: Pulmonary effort is normal.  Skin:    General: Skin is warm and dry.  Neurological:     Mental Status: He is alert and oriented to person, place, and time.     Gait: Gait abnormal.  Psychiatric:        Mood and Affect: Mood normal.        Behavior: Behavior normal.        Thought Content: Thought content normal.        Judgment: Judgment normal.    BP 102/62   Pulse 92   Ht 6' (1.829 m)   Wt 213 lb (96.6 kg)   BMI 28.89 kg/m   Past Medical History:  Diagnosis Date   Anemia    Arthritis    Broken arm 05/2013   Left   CHF (congestive heart failure) (HCC)    Chronic  kidney disease    Coronary artery disease    Diabetes mellitus without complication (HCC)    Dyspnea    DOE   Edema    FEET/LEGS OCCAS   Hypertension    Myocardial infarction ([place])    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   left frontal scalp, [facility]   Squamous cell carcinoma of skin 05/05/2020   left temporal scalp, in situ, [facility] 05/13/20    Social History   Socioeconomic History   Marital status: Married    Spouse name: Not on file   Number of children: Not on file   Years of education: Not on file   Highest education level: Not on file  Occupational History   Not on file  Tobacco Use   Smoking status: Former    Types: Cigarettes   Smokeless tobacco: Never   Tobacco comments:    quit 30 yrs ago, smoked 14-15 yrs, mainly pipe  Vaping Use   Vaping Use: Never used  Substance and Sexual Activity   Alcohol use: Not Currently    Alcohol/week: 1.0 standard drink    Types: 1 Cans of beer per week    Comment: occasionally drinking only,once a month  Drug use: No   Sexual activity: Never  Other Topics Concern   Not on file  Social History Narrative   Not on file   Social Determinants of Health   Financial Resource Strain: Not on file  Food Insecurity: Not on file  Transportation Needs: Not on file  Physical Activity: Not on file  Stress: Not on file  Social Connections: Not on file  Intimate Partner Violence: Not on file    Past Surgical History:  Procedure Laterality Date   CATARACT EXTRACTION W/PHACO Left 07/26/2017   Procedure: CATARACT EXTRACTION PHACO AND INTRAOCULAR LENS PLACEMENT ([place]);  Surgeon: Birder Robson, MD;  Location: [facility] ORS;  Service: Ophthalmology;  Laterality: Left;  [place]   00:45.8 AP%  13.1 CDE  6.00 Fluid Pack Lot # [id number]   CATARACT EXTRACTION W/PHACO Right 08/17/2017   Procedure: CATARACT EXTRACTION PHACO AND INTRAOCULAR LENS PLACEMENT ([facility]);  Surgeon: Birder Robson, MD;  Location: [facility] ORS;  Service: Ophthalmology;  Laterality:  Right;  [place]  01:30 AP% 16.8 CDE 15.24 Fluid pack lot # [id number]   CHOLECYSTECTOMY     CORONARY ANGIOPLASTY     STENTS   CORONARY ARTERY BYPASS GRAFT     DIALYSIS/PERMA CATHETER INSERTION N/A 02/09/2018   Procedure: DIALYSIS/PERMA CATHETER INSERTION;  Surgeon: Algernon Huxley, MD;  Location: [place] [facility];  Service: Cardiovascular;  Laterality: N/A;   FRACTURE SURGERY     ARM   INSERTION OF DIALYSIS CATHETER Right 11/03/2020   Procedure: INSERTION OF Perm Cath in the RIGHT INTERNAL JUGULAR;  Surgeon: Algernon Huxley, MD;  Location: [facility] ORS;  Service: Vascular;  Laterality: Right;   LEFT HEART CATHETERIZATION WITH CORONARY/GRAFT ANGIOGRAM Left 12/04/2013   Procedure: LEFT HEART CATHETERIZATION WITH Beatrix Fetters;  Surgeon: Leonie Man, MD;  Location: [facility] CATH LAB;  Service: Cardiovascular;  Laterality: Left;   PERCUTANEOUS CORONARY STENT INTERVENTION (PCI-S) N/A 12/06/2013   Procedure: PERCUTANEOUS CORONARY STENT INTERVENTION (PCI-S);  Surgeon: Sinclair Grooms, MD;  [facility] LAB;  Service: Cardiovascular;  Laterality: N/A;   REMOVAL OF A DIALYSIS CATHETER N/A 11/03/2020   Procedure: REMOVAL OF A DIALYSIS CATHETER;  Surgeon: Algernon Huxley, MD;  Location: [facility] ORS;  Service: Vascular;  Laterality: N/A;   VEIN BYPASS SURGERY     [place] Bypass Surgery     Family History  Problem Relation Age of Onset   Heart attack Father 65       died first MI at age 50   Heart failure Mother     Allergies  Allergen Reactions   Other     Other reaction(s): Myalgias (intolerance), Other (See Comments) Statin Drugs  Statin Drugs     Amoxicillin Diarrhea    CBC Latest Ref Rng & Units 02/17/2021 02/16/2021 02/15/2021  WBC 4.0 - 10.5 K/uL 5.2 5.2 5.7  Hemoglobin 13.0 - 17.0 g/dL 10.4(L) 9.2(L) 9.1(L)  Hematocrit 39.0 - 52.0 % 31.7(L) 28.0(L) 27.4(L)  Platelets 150 - 400 K/uL 159 149(L) 147(L)      CMP     Component Value Date/Time   NA 136 02/17/2021 0413   K 4.2  02/17/2021 0413   CL 99 02/17/2021 0413   CO2 30 02/17/2021 0413   GLUCOSE 173 (H) 02/17/2021 0413   BUN 36 (H) 02/17/2021 0413   CREATININE 2.93 (H) 02/17/2021 0413   CALCIUM 8.9 02/17/2021 0413   PROT 6.3 (L) 02/11/2021 0847   ALBUMIN 3.2 (L) 02/11/2021 0847   AST 33 02/11/2021 0847   ALT  27 02/11/2021 0847   ALKPHOS 149 (H) 02/11/2021 0847   BILITOT 1.0 02/11/2021 0847   GFRNONAA 20 (L) 02/17/2021 0413   GFRAA 25 (L) 05/26/2019 0503     No results found.     Assessment & Plan:   1. ESRD (end stage renal disease) ([place]) Recommend:   At this time the patient does not have appropriate extremity access for dialysis   Patient should have a left brachial axillary graft created.   The risks, benefits and alternative therapies were reviewed in detail with the patient.  All questions were answered.  The patient agrees to proceed with surgery.      2. Hyperlipidemia, unspecified hyperlipidemia type Continue statin as ordered and reviewed, no changes at this time    Current Outpatient Medications on File Prior to Visit  Medication Sig Dispense Refill   acyclovir ointment (ZOVIRAX) 5 % Apply 1 application topically daily as needed (shingles).     amiodarone (PACERONE) 200 MG tablet Take 200 mg by mouth daily.     bisacodyl (DULCOLAX) 10 MG suppository Place 1 suppository (10 mg total) rectally daily as needed for moderate constipation. 12 suppository 0   cholecalciferol (VITAMIN D) 25 MCG (1000 UNIT) tablet Take 1,000 Units by mouth daily with breakfast.     clopidogrel (PLAVIX) 75 MG tablet Take 1 tablet (75 mg total) by mouth daily with breakfast. 30 tablet 12   ezetimibe (ZETIA) 10 MG tablet Take 10 mg by mouth daily before breakfast.     ferrous sulfate 325 (65 FE) MG tablet Take 325 mg by mouth 2 (two) times daily with a meal.      fluticasone (FLONASE) 50 MCG/ACT nasal spray Place 2 sprays into both nostrils daily as needed for allergies or rhinitis.     gabapentin  (NEURONTIN) 300 MG capsule Take 1 capsule (300 mg total) by mouth at bedtime.     glipiZIDE (GLUCOTROL) 5 MG tablet Take 2.5 mg by mouth daily at 12 noon.     levocetirizine (XYZAL) 5 MG tablet Take 10 mg by mouth every morning.      levothyroxine (SYNTHROID) 75 MCG tablet Take 1 tablet (75 mcg total) by mouth daily before breakfast.     metoprolol succinate (TOPROL-XL) 25 MG 24 hr tablet Take 0.5 tablets (12.5 mg total) by mouth at bedtime. 30 tablet 0   midodrine (PROAMATINE) 5 MG tablet Take 1 tablet (5 mg total) by mouth 3 (three) times daily with meals.     multivitamin (RENA-VIT) TABS tablet Take 1 tablet by mouth daily.     nitroGLYCERIN (NITROSTAT) 0.4 MG SL tablet Place 0.4 mg under the tongue every 5 (five) minutes as needed for chest pain.     Omega-3 Fatty Acids (FISH OIL) 1000 MG CAPS Take 4,000 mg by mouth daily with lunch.     polyethylene glycol (MIRALAX / GLYCOLAX) 17 g packet Take 17 g by mouth daily. 14 each 0   potassium chloride SA (KLOR-CON) 20 MEQ tablet Take 20 mEq by mouth daily.     rosuvastatin (CRESTOR) 40 MG tablet Take 40 mg by mouth at bedtime.     senna-docusate (SENOKOT-S) 8.6-50 MG tablet Take 2 tablets by mouth 2 (two) times daily. 30 tablet 0   traMADol (ULTRAM) 50 MG tablet Take 1 tablet (50 mg total) by mouth every 6 (six) hours as needed for moderate pain. 10 tablet 0   furosemide (LASIX) 40 MG tablet Take 40 mg by mouth. (Patient not taking: Reported on  04/06/2021)     No current facility-administered medications on file prior to visit.    There are no Patient Instructions on file for this visit. No follow-ups on file.   Kris Hartmann, NP

## 2021-04-06 NOTE — Progress Notes (Signed)
Subjective:    Patient ID: Matthew [name], male    DOB: 1931/04/26, 87 y.o.   MRN: [id number] Chief Complaint  Patient presents with   Follow-up    Consult ( v- mapping done 7/22)    Matthew [name] is an 87 year old male that is seen for evaluation for dialysis access.  The patient has recently began hemodialysis with utilization of a PermCath.  He denies any issues with his PermCath currently.  The patient is right-handed.   The patient has been considering the various methods of dialysis and wishes to proceed with hemodialysis and therefore creation of AV access.   The patient denies amaurosis fugax or recent TIA symptoms. There are no recent neurological changes noted. The patient denies claudication symptoms or rest pain symptoms. The patient denies history of DVT, PE or superficial thrombophlebitis. The patient denies recent episodes of angina or shortness of breath.     Noninvasive studies show adequate access for creation of a left brachial axillary AV graft.   Review of Systems  Musculoskeletal:  Positive for gait problem.  Neurological:  Positive for weakness.  All other systems reviewed and are negative.     Objective:   Physical Exam Vitals reviewed.  HENT:     Head: Normocephalic.  Cardiovascular:     Rate and Rhythm: Normal rate.     Pulses: Normal pulses.  Pulmonary:     Effort: Pulmonary effort is normal.  Skin:    General: Skin is warm and dry.  Neurological:     Mental Status: He is alert and oriented to person, place, and time.     Gait: Gait abnormal.  Psychiatric:        Mood and Affect: Mood normal.        Behavior: Behavior normal.        Thought Content: Thought content normal.        Judgment: Judgment normal.    BP 102/62   Pulse 92   Ht 6' (1.829 m)   Wt 213 lb (96.6 kg)   BMI 28.89 kg/m   Past Medical History:  Diagnosis Date   Anemia    Arthritis    Broken arm 05/2013   Left   CHF (congestive heart failure) (HCC)    Chronic  kidney disease    Coronary artery disease    Diabetes mellitus without complication (HCC)    Dyspnea    DOE   Edema    FEET/LEGS OCCAS   Hypertension    Myocardial infarction ([place])    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   left frontal scalp, [facility]   Squamous cell carcinoma of skin 05/05/2020   left temporal scalp, in situ, [facility] 05/13/20    Social History   Socioeconomic History   Marital status: Married    Spouse name: Not on file   Number of children: Not on file   Years of education: Not on file   Highest education level: Not on file  Occupational History   Not on file  Tobacco Use   Smoking status: Former    Types: Cigarettes   Smokeless tobacco: Never   Tobacco comments:    quit 30 yrs ago, smoked 14-15 yrs, mainly pipe  Vaping Use   Vaping Use: Never used  Substance and Sexual Activity   Alcohol use: Not Currently    Alcohol/week: 1.0 standard drink    Types: 1 Cans of beer per week    Comment: occasionally drinking only,once a month  Drug use: No   Sexual activity: Never  Other Topics Concern   Not on file  Social History Narrative   Not on file   Social Determinants of Health   Financial Resource Strain: Not on file  Food Insecurity: Not on file  Transportation Needs: Not on file  Physical Activity: Not on file  Stress: Not on file  Social Connections: Not on file  Intimate Partner Violence: Not on file    Past Surgical History:  Procedure Laterality Date   CATARACT EXTRACTION W/PHACO Left 07/26/2017   Procedure: CATARACT EXTRACTION PHACO AND INTRAOCULAR LENS PLACEMENT ([place]);  Surgeon: Birder Robson, MD;  Location: [facility] ORS;  Service: Ophthalmology;  Laterality: Left;  [place]   00:45.8 AP%  13.1 CDE  6.00 Fluid Pack Lot # [id number]   CATARACT EXTRACTION W/PHACO Right 08/17/2017   Procedure: CATARACT EXTRACTION PHACO AND INTRAOCULAR LENS PLACEMENT ([facility]);  Surgeon: Birder Robson, MD;  Location: [facility] ORS;  Service: Ophthalmology;  Laterality:  Right;  [place]  01:30 AP% 16.8 CDE 15.24 Fluid pack lot # [id number]   CHOLECYSTECTOMY     CORONARY ANGIOPLASTY     STENTS   CORONARY ARTERY BYPASS GRAFT     DIALYSIS/PERMA CATHETER INSERTION N/A 02/09/2018   Procedure: DIALYSIS/PERMA CATHETER INSERTION;  Surgeon: Algernon Huxley, MD;  Location: [place] CV LAB;  Service: Cardiovascular;  Laterality: N/A;   FRACTURE SURGERY     ARM   INSERTION OF DIALYSIS CATHETER Right 11/03/2020   Procedure: INSERTION OF Perm Cath in the RIGHT INTERNAL JUGULAR;  Surgeon: Algernon Huxley, MD;  Location: [facility] ORS;  Service: Vascular;  Laterality: Right;   LEFT HEART CATHETERIZATION WITH CORONARY/GRAFT ANGIOGRAM Left 12/04/2013   Procedure: LEFT HEART CATHETERIZATION WITH Beatrix Fetters;  Surgeon: Leonie Man, MD;  Location: [facility] CATH LAB;  Service: Cardiovascular;  Laterality: Left;   PERCUTANEOUS CORONARY STENT INTERVENTION (PCI-S) N/A 12/06/2013   Procedure: PERCUTANEOUS CORONARY STENT INTERVENTION (PCI-S);  Surgeon: Sinclair Grooms, MD;  Location: [facility] CATH LAB;  Service: Cardiovascular;  Laterality: N/A;   REMOVAL OF A DIALYSIS CATHETER N/A 11/03/2020   Procedure: REMOVAL OF A DIALYSIS CATHETER;  Surgeon: Algernon Huxley, MD;  Location: [facility] ORS;  Service: Vascular;  Laterality: N/A;   VEIN BYPASS SURGERY     [place] Bypass Surgery     Family History  Problem Relation Age of Onset   Heart attack Father 66       died first MI at age 33   Heart failure Mother     Allergies  Allergen Reactions   Other     Other reaction(s): Myalgias (intolerance), Other (See Comments) Statin Drugs  Statin Drugs     Amoxicillin Diarrhea    CBC Latest Ref Rng & Units 02/17/2021 02/16/2021 02/15/2021  WBC 4.0 - 10.5 K/uL 5.2 5.2 5.7  Hemoglobin 13.0 - 17.0 g/dL 10.4(L) 9.2(L) 9.1(L)  Hematocrit 39.0 - 52.0 % 31.7(L) 28.0(L) 27.4(L)  Platelets 150 - 400 K/uL 159 149(L) 147(L)      CMP     Component Value Date/Time   NA 136 02/17/2021 0413   K 4.2  02/17/2021 0413   CL 99 02/17/2021 0413   CO2 30 02/17/2021 0413   GLUCOSE 173 (H) 02/17/2021 0413   BUN 36 (H) 02/17/2021 0413   CREATININE 2.93 (H) 02/17/2021 0413   CALCIUM 8.9 02/17/2021 0413   PROT 6.3 (L) 02/11/2021 0847   ALBUMIN 3.2 (L) 02/11/2021 0847   AST 33 02/11/2021 0847   ALT  27 02/11/2021 0847   ALKPHOS 149 (H) 02/11/2021 0847   BILITOT 1.0 02/11/2021 0847   GFRNONAA 20 (L) 02/17/2021 0413   GFRAA 25 (L) 05/26/2019 0503     No results found.     Assessment & Plan:   1. ESRD (end stage renal disease) ([place]) Recommend:   At this time the patient does not have appropriate extremity access for dialysis   Patient should have a left brachial axillary graft created.   The risks, benefits and alternative therapies were reviewed in detail with the patient.  All questions were answered.  The patient agrees to proceed with surgery.      2. Hyperlipidemia, unspecified hyperlipidemia type Continue statin as ordered and reviewed, no changes at this time    Current Outpatient Medications on File Prior to Visit  Medication Sig Dispense Refill   acyclovir ointment (ZOVIRAX) 5 % Apply 1 application topically daily as needed (shingles).     amiodarone (PACERONE) 200 MG tablet Take 200 mg by mouth daily.     bisacodyl (DULCOLAX) 10 MG suppository Place 1 suppository (10 mg total) rectally daily as needed for moderate constipation. 12 suppository 0   cholecalciferol (VITAMIN D) 25 MCG (1000 UNIT) tablet Take 1,000 Units by mouth daily with breakfast.     clopidogrel (PLAVIX) 75 MG tablet Take 1 tablet (75 mg total) by mouth daily with breakfast. 30 tablet 12   ezetimibe (ZETIA) 10 MG tablet Take 10 mg by mouth daily before breakfast.     ferrous sulfate 325 (65 FE) MG tablet Take 325 mg by mouth 2 (two) times daily with a meal.      fluticasone (FLONASE) 50 MCG/ACT nasal spray Place 2 sprays into both nostrils daily as needed for allergies or rhinitis.     gabapentin  (NEURONTIN) 300 MG capsule Take 1 capsule (300 mg total) by mouth at bedtime.     glipiZIDE (GLUCOTROL) 5 MG tablet Take 2.5 mg by mouth daily at 12 noon.     levocetirizine (XYZAL) 5 MG tablet Take 10 mg by mouth every morning.      levothyroxine (SYNTHROID) 75 MCG tablet Take 1 tablet (75 mcg total) by mouth daily before breakfast.     metoprolol succinate (TOPROL-XL) 25 MG 24 hr tablet Take 0.5 tablets (12.5 mg total) by mouth at bedtime. 30 tablet 0   midodrine (PROAMATINE) 5 MG tablet Take 1 tablet (5 mg total) by mouth 3 (three) times daily with meals.     multivitamin (RENA-VIT) TABS tablet Take 1 tablet by mouth daily.     nitroGLYCERIN (NITROSTAT) 0.4 MG SL tablet Place 0.4 mg under the tongue every 5 (five) minutes as needed for chest pain.     Omega-3 Fatty Acids (FISH OIL) 1000 MG CAPS Take 4,000 mg by mouth daily with lunch.     polyethylene glycol (MIRALAX / GLYCOLAX) 17 g packet Take 17 g by mouth daily. 14 each 0   potassium chloride SA (KLOR-CON) 20 MEQ tablet Take 20 mEq by mouth daily.     rosuvastatin (CRESTOR) 40 MG tablet Take 40 mg by mouth at bedtime.     senna-docusate (SENOKOT-S) 8.6-50 MG tablet Take 2 tablets by mouth 2 (two) times daily. 30 tablet 0   traMADol (ULTRAM) 50 MG tablet Take 1 tablet (50 mg total) by mouth every 6 (six) hours as needed for moderate pain. 10 tablet 0   furosemide (LASIX) 40 MG tablet Take 40 mg by mouth. (Patient not taking: Reported on  04/06/2021)     No current facility-administered medications on file prior to visit.    There are no Patient Instructions on file for this visit. No follow-ups on file.   Kris Hartmann, NP

## 2021-04-07 ENCOUNTER — Telehealth (INDEPENDENT_AMBULATORY_CARE_PROVIDER_SITE_OTHER): Payer: Self-pay

## 2021-04-07 DIAGNOSIS — Z992 Dependence on renal dialysis: Secondary | ICD-10-CM | POA: Diagnosis not present

## 2021-04-07 DIAGNOSIS — N186 End stage renal disease: Secondary | ICD-10-CM | POA: Diagnosis not present

## 2021-04-07 DIAGNOSIS — D631 Anemia in chronic kidney disease: Secondary | ICD-10-CM | POA: Diagnosis not present

## 2021-04-07 NOTE — Telephone Encounter (Signed)
Spoke with the patient's relative Matthew [name] and the patient is scheduled with Dr. Lucky Cowboy for a left brachial axillary graft on 04/15/21 at the [facility]. Patient will have a pre-op phone call on 04/09/21 between 8-1 pm. Pre-surgical instructions were discussed and will be mailed.

## 2021-04-09 ENCOUNTER — Other Ambulatory Visit
Admission: RE | Admit: 2021-04-09 | Discharge: 2021-04-09 | Disposition: A | Discharge status: Home / Self Care | Payer: Medicare Other | Source: Ambulatory Visit | Attending: Vascular Surgery | Admitting: Vascular Surgery

## 2021-04-09 ENCOUNTER — Other Ambulatory Visit: Payer: Self-pay

## 2021-04-09 ENCOUNTER — Other Ambulatory Visit (INDEPENDENT_AMBULATORY_CARE_PROVIDER_SITE_OTHER): Payer: Self-pay | Admitting: Nurse Practitioner

## 2021-04-09 DIAGNOSIS — N186 End stage renal disease: Secondary | ICD-10-CM

## 2021-04-09 DIAGNOSIS — D631 Anemia in chronic kidney disease: Secondary | ICD-10-CM | POA: Diagnosis not present

## 2021-04-09 DIAGNOSIS — Z992 Dependence on renal dialysis: Secondary | ICD-10-CM | POA: Diagnosis not present

## 2021-04-09 HISTORY — DX: Pneumonia, unspecified organism: J18.9

## 2021-04-09 HISTORY — DX: Angina pectoris, unspecified: I20.9

## 2021-04-09 NOTE — Patient Instructions (Addendum)
Your procedure is scheduled on: 04/15/21 - Wednesday Report to the Registration Desk on the 1st floor of the [facility]. To find out your arrival time, please call [phone] between 1PM - 3PM on: 04/14/21 Tuesday  REMEMBER: Instructions that are not followed completely may result in serious medical risk, up to and including death; or upon the discretion of your surgeon and anesthesiologist your surgery may need to be rescheduled.  Do not eat food after midnight the night before surgery.  No gum chewing, lozengers or hard candies.  You may however, drink CLEAR liquids up to 2 hours before you are scheduled to arrive for your surgery. Do not drink anything within 2 hours of your scheduled arrival time.  Clear liquids include: - water  Type 1 and Type 2 diabetics should only drink water.  TAKE THESE MEDICATIONS THE MORNING OF SURGERY WITH A SIP OF WATER:  - amiodarone (PACERONE) 200 MG tablet - ezetimibe (ZETIA) 10 MG tablet - levocetirizine (XYZAL) 5 MG tablet - levothyroxine (SYNTHROID) 75 MCG tablet - midodrine (PROAMATINE) 5 MG tablet if needed  Stop taking clopidogrel (PLAVIX) 75 MG tablet beginning 04/10/21.   One week prior to surgery: Stop Anti-inflammatories (NSAIDS) such as Advil, Aleve, Ibuprofen, Motrin, Naproxen, Naprosyn and Aspirin based products such as Excedrin, Goodys Powder, BC Powder.  Stop ANY OVER THE COUNTER supplements until after surgery - Omega-3 Fatty Acids (FISH OIL) 1000 MG CAPS  You may however, continue to take Tylenol if needed for pain up until the day of surgery.  No Alcohol for 24 hours before or after surgery.  No Smoking including e-cigarettes for 24 hours prior to surgery.  No chewable tobacco products for at least 6 hours prior to surgery.  No nicotine patches on the day of surgery.  Do not use any "recreational" drugs for at least a week prior to your surgery.  Please be advised that the combination of cocaine and anesthesia may have  negative outcomes, up to and including death. If you test positive for cocaine, your surgery will be cancelled.  On the morning of surgery brush your teeth with toothpaste and water, you may rinse your mouth with mouthwash if you wish. Do not swallow any toothpaste or mouthwash.  Take a fresh shower/bath the morning of surgery, you may apply deodorant.  Do not wear jewelry, make-up, hairpins, clips or nail polish.  Do not wear lotions, powders, or perfumes.   Do not shave body from the neck down 48 hours prior to surgery just in case you cut yourself which could leave a site for infection.  Also, freshly shaved skin may become irritated if using the CHG soap.  Contact lenses, hearing aids and dentures may not be worn into surgery.  Do not bring valuables to the hospital. [facility] is not responsible for any missing/lost belongings or valuables.   Notify your doctor if there is any change in your medical condition (cold, fever, infection).  Wear comfortable clothing (specific to your surgery type) to the hospital.  After surgery, you can help prevent lung complications by doing breathing exercises.  Take deep breaths and cough every 1-2 hours. Your doctor may order a device called an Incentive Spirometer to help you take deep breaths. When coughing or sneezing, hold a pillow firmly against your incision with both hands. This is called "splinting." Doing this helps protect your incision. It also decreases belly discomfort.  If you are being admitted to the hospital overnight, leave your suitcase in the  car. After surgery it may be brought to your room.  If you are being discharged the day of surgery, you will not be allowed to drive home. You will need a responsible adult (18 years or older) to drive you home and stay with you that night.   If you are taking public transportation, you will need to have a responsible adult (18 years or older) with you. Please confirm with your  physician that it is acceptable to use public transportation.   Please call the [facility] at [phone] if you have any questions about these instructions.  Surgery Visitation Policy:  Patients undergoing a surgery or procedure may have one family member or support person with them as long as that person is not COVID-19 positive or experiencing its symptoms.  That person may remain in the waiting area during the procedure and may rotate out with other people.  Inpatient Visitation:    Visiting hours are 7 a.m. to 8 p.m. Up to two visitors ages 16+ are allowed at one time in a patient room. The visitors may rotate out with other people during the day. Visitors must check out when they leave, or other visitors will not be allowed. One designated support person may remain overnight. The visitor must pass COVID-19 screenings, use hand sanitizer when entering and exiting the patient's room and wear a mask at all times, including in the patient's room. Patients must also wear a mask when staff or their visitor are in the room. Masking is required regardless of vaccination status.

## 2021-04-10 ENCOUNTER — Other Ambulatory Visit
Admission: RE | Admit: 2021-04-10 | Discharge: 2021-04-10 | Disposition: A | Discharge status: Home / Self Care | Payer: Medicare Other | Source: Ambulatory Visit | Attending: Vascular Surgery | Admitting: Vascular Surgery

## 2021-04-10 DIAGNOSIS — Z01818 Encounter for other preprocedural examination: Secondary | ICD-10-CM | POA: Diagnosis not present

## 2021-04-10 DIAGNOSIS — I440 Atrioventricular block, first degree: Secondary | ICD-10-CM | POA: Insufficient documentation

## 2021-04-10 DIAGNOSIS — I4891 Unspecified atrial fibrillation: Secondary | ICD-10-CM | POA: Insufficient documentation

## 2021-04-10 DIAGNOSIS — N186 End stage renal disease: Secondary | ICD-10-CM

## 2021-04-10 LAB — TYPE AND SCREEN
ABO/RH(D): O POS
Antibody Screen: NEGATIVE

## 2021-04-10 LAB — BASIC METABOLIC PANEL
Anion gap: 9 (ref 5–15)
BUN: 47 mg/dL — ABNORMAL HIGH (ref 8–23)
CO2: 25 mmol/L (ref 22–32)
Calcium: 9.1 mg/dL (ref 8.9–10.3)
Chloride: 99 mmol/L (ref 98–111)
Creatinine, Ser: 2.84 mg/dL — ABNORMAL HIGH (ref 0.61–1.24)
GFR, Estimated: 21 mL/min — ABNORMAL LOW (ref >60)
Glucose, Bld: 104 mg/dL — ABNORMAL HIGH (ref 70–99)
Potassium: 4.8 mmol/L (ref 3.5–5.1)
Sodium: 133 mmol/L — ABNORMAL LOW (ref 135–145)

## 2021-04-10 LAB — CBC WITH DIFFERENTIAL/PLATELET
Abs Immature Granulocytes: 0.01 10*3/uL (ref 0.00–0.07)
Basophils Absolute: 0.1 10*3/uL (ref 0.0–0.1)
Basophils Relative: 1 %
Eosinophils Absolute: 0.5 10*3/uL (ref 0.0–0.5)
Eosinophils Relative: 8 %
HCT: 32.7 % — ABNORMAL LOW (ref 39.0–52.0)
Hemoglobin: 10.9 g/dL — ABNORMAL LOW (ref 13.0–17.0)
Immature Granulocytes: 0 %
Lymphocytes Relative: 18 %
Lymphs Abs: 1 10*3/uL (ref 0.7–4.0)
MCH: 31.3 pg (ref 26.0–34.0)
MCHC: 33.3 g/dL (ref 30.0–36.0)
MCV: 94 fL (ref 80.0–100.0)
Monocytes Absolute: 0.8 10*3/uL (ref 0.1–1.0)
Monocytes Relative: 14 %
Neutro Abs: 3.4 10*3/uL (ref 1.7–7.7)
Neutrophils Relative %: 59 %
Platelets: 169 10*3/uL (ref 150–400)
RBC: 3.48 MIL/uL — ABNORMAL LOW (ref 4.22–5.81)
RDW: 15.4 % (ref 11.5–15.5)
WBC: 5.8 10*3/uL (ref 4.0–10.5)
nRBC: 0 % (ref 0.0–0.2)

## 2021-04-11 DIAGNOSIS — Z992 Dependence on renal dialysis: Secondary | ICD-10-CM | POA: Diagnosis not present

## 2021-04-11 DIAGNOSIS — D631 Anemia in chronic kidney disease: Secondary | ICD-10-CM | POA: Diagnosis not present

## 2021-04-11 DIAGNOSIS — N186 End stage renal disease: Secondary | ICD-10-CM | POA: Diagnosis not present

## 2021-04-12 ENCOUNTER — Encounter: Payer: Self-pay | Admitting: Vascular Surgery

## 2021-04-12 NOTE — Progress Notes (Signed)
Perioperative Services  Pre-Admission/Anesthesia Testing Clinical Review  Date: 04/12/21  Patient Demographics:  Name: Matthew [name] DOB:   [birth year] MRN:   [id number]  Planned Surgical Procedure(s):    Case: [id number] Date/Time: 04/15/21 0715   Procedure: INSERTION OF ARTERIOVENOUS (AV) GORE-TEX GRAFT ARM ( BRACHIAL AXILLARY) (Left)   Anesthesia type: General   Pre-op diagnosis: ESRD   Location: [facility] OR ROOM 03 / [place] [facility]   Surgeons: Algernon Huxley, MD   NOTE: Available PAT nursing documentation and vital signs have been reviewed. Clinical nursing staff has updated patient's PMH/PSHx, current medication list, and drug allergies/intolerances to ensure comprehensive history available to assist in medical decision making as it pertains to the aforementioned surgical procedure and anticipated anesthetic course. Extensive review of available clinical information performed. [place] PMH and PSHx updated with any diagnoses/procedures that  may have been inadvertently omitted during his intake with the pre-admission testing department's nursing staff.  Clinical Discussion:  Matthew [name] is a 87 y.o. male who is submitted for pre-surgical anesthesia review and clearance prior to him undergoing the above procedure. Patient is a Former Smoker (quit in approximately 1992). Pertinent PMH includes: CAD (s/p CABG), MI, atrial fibrillation, HFrEF, angina, cardiomyopathy, valvular insufficiency, ascending aortic aneurysm, bradycardia, cardiac murmur, HTN, HLD, T2DM, ESRD, DOE, GERD (no daily Tx), IDA, OA, peripheral edema.  Patient is followed by cardiology Humphrey Rolls, MD). He was last seen in the cardiology clinic on 03/23/2021; notes reviewed.  At the time of his clinic visit, patient doing well overall from a cardiovascular perspective.  Patient denied any episodes of chest pain, significant shortness breath, PND, orthopnea, palpitations, vertiginous symptoms, or presyncope/syncope.   Patient with chronic peripheral edema that is at baseline for which patient uses both diuretic medications and antiembolism stockings.  Patient with a PMH of significant cardiovascular disease.  Patient reported to have suffered an MI back in 2001.  Records related to this cardiac event not available for review at time of consult.  He subsequently underwent a 5 vessel CABG procedure, whereby LIMA-LAD, SVG-D1, SVG-OM1, SVG-OM2, and SVG-PDA bypass grafts were placed.  Cardiac catheterization performed on 02/06/2009 revealed multivessel CAD; 100% mid LAD, 90% D1, 100% proximal LCx, 99% OM1, 50% proximal RCA, 50% mid RCA, and high percent RPDA.  All previously placed bypass grafts were widely patent.  TTE performed on 12/03/2013 revealed a moderately reduced left ventricular systolic function with a reduced ejection fraction of 35-40%.  There was severe inferolateral and inferior myocardial hypokinesis.  Left atrium was mildly dilated.  There was mild aortic and mitral valve regurgitation.  PASP moderately elevated at 52 mmHg.  There was no significant transvalvular gradient to suggest any valvular stenosis.  Repeat cardiac catheterization performed on 12/04/2013 revealing multivessel CAD; 95% proximal LAD, 100% stenosis just after SP2 (LAD), 100% proximal LCx, 30% proximal RCA, and 40% mid RCA.  LIMA-LAD with 40% stenosis at the anastomosis.  SVG-D1, SVG-OM1, and SVG-OM2 all noted to be patent.  SVG-RPDA bypass graft with 90% ostial and 70 to 90% mid graft stenoses.  Decision was made to perform a staged PCI procedure.  Patient underwent the staged PCI procedure on 12/06/2013 whereby overlapping 4.0 x 38 mm (mid) and 4.0 x 28 mm (ostial) Promus Premier DES x 2 were placed to the SVG-RPDA graft resulting in 0% residual stenosis and restoration of TIMI-3 flow.  TTE performed on 07/18/2015 revealed a reduced left ventricular systolic function with an EF of 45%.  There was mild  AR/PR, moderate TR, and severe  MR.  There was no significant transvalvular gradient to suggest stenosis.  Ascending aorta severely dilated at 7.0 cm.  Patient was seen in consult by CVTS, however ultimately refused intervention for repair.  Last TTE was performed on 01/10/2018 revealing a moderately reduced left ventricular systolic function with an EF of 45%.  There was diffuse hypokinesis.  There was moderate tricuspid valve regurgitation noted.  Aortic root dilated at 3.8 cm and ascending aorta dilatation stable at 6.8 cm.  Again, patient has been seen in consult by CVTS and vascular surgery, however he is not interested in having aneurysmal defect repaired.  Patient with an atrial fibrillation diagnosis; CHA2DS2-VASc Score = 6 (age x 2, HTN, HFrEF, previous MI, T2DM).  Rate and rhythm maintained on oral amiodarone and metoprolol succinate. Given atrial fibrillation, history of multivessel CABG, and presence of cardiac stents, patient is on daily antiplatelet therapy using clopidogrel; compliant with therapy with no evidence or reports of GI bleeding.  Blood pressure well controlled at 100/60 on currently prescribed CCB, diuretic, and beta-blocker therapies.  Patient is on a statin + and omega-3 fatty acid capsule daily for his HLD and further ASCVD prevention. T2DM well-controlled on currently prescribed regimen; last Hgb A1c was 6.2% when checked on 02/11/2021.  Functional capacity limited by age, general debility, and multiple medical comorbidities.  It is not felt that patient can achieve 4 METS of activity without angina/anginal equivalent symptoms. No changes were made to his medication regimen.  Patient to follow-up with outpatient cardiology in 1 month or sooner if needed.  Matthew [name] is scheduled for an INSERTION OF A LEFT ARTERIOVENOUS (AV) GORE-TEX GRAFT (BRACHIAL AXILLARY) on 04/15/2021 with Dr. [name], MD.  Given patient's past medical history significant for cardiovascular diagnoses, presurgical cardiac clearance  was sought by the PAT team. Per cardiology, "this patient is optimized for surgery and may proceed with the planned procedural course with an ACCEPTABLE risk of significant perioperative cardiovascular complications".  Again, this patient is on daily antiplatelet therapy.  He has been instructed on recommendations for holding his clopidogrel for 5 days prior to his procedure with plans to restart as soon as postoperative bleeding risk felt to be minimized by his primary attending surgeon. The patient is aware that his last dose of clopidogrel will be on 04/09/2021.  Patient denies previous perioperative complications with anesthesia in the past. In review of the available records, it is noted that patient underwent a general anesthetic course here (ASA III) in 10/2020 without documented complications.   Vitals with BMI 04/06/2021 02/17/2021 02/17/2021  Height 6\' 0"  - -  Weight 213 lbs - -  BMI 70.78 - -  Systolic 675 449 -  Diastolic 62 55 -  Pulse 92 76 72    Providers/Specialists:   NOTE: Primary physician provider listed below. Patient may have been seen by APP or partner within same practice.   PROVIDER ROLE / SPECIALTY LAST Imelda Pillow, MD Vascular Surgery 04/06/2021  Jodi Marble, MD Primary Care Provider 03/25/2021  Matthew Laming, MD Cardiology 03/23/2021  Matthew Hives, MD Endocrinology 12/18/2020  Anthonette Legato, MD Nephrology 02/17/2021   Allergies:  Other and Amoxicillin  Current Home Medications:   No current facility-administered medications for this encounter.    acyclovir ointment (ZOVIRAX) 5 %   amiodarone (PACERONE) 200 MG tablet   bisacodyl (DULCOLAX) 10 MG suppository   cholecalciferol (VITAMIN D) 25 MCG (1000 UNIT) tablet   clopidogrel (PLAVIX)  75 MG tablet   ezetimibe (ZETIA) 10 MG tablet   ferrous sulfate 325 (65 FE) MG tablet   fluticasone (FLONASE) 50 MCG/ACT nasal spray   furosemide (LASIX) 40 MG tablet   gabapentin (NEURONTIN) 300 MG  capsule   glipiZIDE (GLUCOTROL) 5 MG tablet   levocetirizine (XYZAL) 5 MG tablet   levothyroxine (SYNTHROID) 75 MCG tablet   metoprolol succinate (TOPROL-XL) 25 MG 24 hr tablet   midodrine (PROAMATINE) 5 MG tablet   multivitamin (RENA-VIT) TABS tablet   nitroGLYCERIN (NITROSTAT) 0.4 MG SL tablet   Omega-3 Fatty Acids (FISH OIL) 1000 MG CAPS   polyethylene glycol (MIRALAX / GLYCOLAX) 17 g packet   potassium chloride SA (KLOR-CON) 20 MEQ tablet   rosuvastatin (CRESTOR) 40 MG tablet   senna-docusate (SENOKOT-S) 8.6-50 MG tablet   traMADol (ULTRAM) 50 MG tablet   History:   Past Medical History:  Diagnosis Date   Anginal pain (HCC)    Arthritis    Ascending aortic aneurysm 07/18/2015   a.) TTE 07/18/2015: severe dilation of ascending aorta measuring 7.0 cm. b.) TTE 01/10/2018: aortic root 3.8 cm; ascending aorta 6.8 cm; refused surgical intervention/repair.   Atrial fibrillation ([place])    a.) CHA2DS2-VASc = 6 (age x 2, HFrEF, HTN, previous MI, T2DM). b.) rate/rhythm maintained on amiodarone + metoprolol succinate; chronic antiplatelet therapy using clopidogrel   Bradycardia    Broken arm 05/2013   Left   Cardiac murmur    a.) RIGHT upper sternal border   Cardiomyopathy [facility])    Coronary artery disease    DOE (dyspnea on exertion)    ESRD (end stage renal disease) (HCC)    GERD (gastroesophageal reflux disease)    HFrEF (heart failure with reduced ejection fraction) ([place])    a.) TTE 12/03/2013: mod dec LV function; EF 35-40%; severe inferolateral and inferior HK; LA dilated; G3DD; PASP 52 mmHg. b.) TTE 07/18/2015: mild LV dysfunction with mild LVH; EF 45%; mild BAE. c.) TTE 01/10/2018: mod LV dysfunction; EF 45%; diffuse HK   HLD (hyperlipidemia)    Hypertension    IDA (iron deficiency anemia)    Long term current use of antithrombotics/antiplatelets    a.) clopidogrel   Myocardial infarction ([place]) 2000   Peripheral edema    Pneumonia    S/P CABG x 4 2001   a.) LIMA-LAD,  SVG-D1, SVG-OM1, SVG-OM2, SVG-PDA   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   left frontal scalp, EDC   Squamous cell carcinoma of skin 05/05/2020   left temporal scalp, in situ, EDC 05/13/20   T2DM (type 2 diabetes mellitus) ([place])    Valvular insufficiency 12/03/2013   a.) TTE 12/03/2013: EF 35-40%; mild AR/MR, b.) TTE 07/18/2015: EF 45%; mild AR/PR, mod TR, severe MR. c.) TTE 01/10/2018: mod AR/TR   Past Surgical History:  Procedure Laterality Date   CATARACT EXTRACTION W/PHACO Left 07/26/2017   Procedure: CATARACT EXTRACTION PHACO AND INTRAOCULAR LENS PLACEMENT ([place]);  Surgeon: Birder Robson, MD;  Location: [facility] ORS;  Service: Ophthalmology;  Laterality: Left;  [place]   00:45.8 AP%  13.1 CDE  6.00 Fluid Pack Lot # [id number]   CATARACT EXTRACTION W/PHACO Right 08/17/2017   Procedure: CATARACT EXTRACTION PHACO AND INTRAOCULAR LENS PLACEMENT ([facility]);  Surgeon: Birder Robson, MD;  Location: [facility] ORS;  Service: Ophthalmology;  Laterality: Right;  [place]  01:30 AP% 16.8 CDE 15.24 Fluid pack lot # [id number]   CHOLECYSTECTOMY     CORONARY ARTERY BYPASS GRAFT N/A 2001   Procedure: 5v CABG (LIMA-LAD, SVG-D1, SVG-OM1,  SVG-OM2, SVG-PDA)   DIALYSIS/PERMA CATHETER INSERTION N/A 02/09/2018   Procedure: DIALYSIS/PERMA CATHETER INSERTION;  Surgeon: Algernon Huxley, MD;  Location: [place] CV LAB;  Service: Cardiovascular;  Laterality: N/A;   FRACTURE SURGERY Left 2015   LEFT arm   INSERTION OF DIALYSIS CATHETER Right 11/03/2020   Procedure: INSERTION OF Perm Cath in the [place];  Surgeon: Algernon Huxley, MD;  Location: [facility] ORS;  Service: Vascular;  Laterality: Right;   LEFT HEART CATHETERIZATION WITH CORONARY/GRAFT ANGIOGRAM Left 12/04/2013   Procedure: LEFT HEART CATHETERIZATION WITH Beatrix Fetters;  Surgeon: Leonie Man, MD;  Location: [facility] C[facility] LAB;  Service: Cardiovascular;  Laterality: Left;   LEFT HEART CATHETERIZATION WITH CORONARY/GRAFT ANGIOGRAM Left 02/06/2009    Procedure: LEFT HEART CATHETERIZATION WITH CORONARY/GRAFT ANGIOGRAM; Location: [place]; Surgeon: Lowanda Foster, MD   PERCUTANEOUS CORONARY STENT INTERVENTION (PCI-S) N/A 12/06/2013   Procedure: STAGED PERCUTANEOUS CORONARY STENT INTERVENTION (overlapping 4.0 x 38 mm (mid) and 4.0 x 28 mm (ostial) Promus Primier DES to SVG-RPDA graft);  Surgeon: Sinclair Grooms, MD;  Location: [facility];  Service: Cardiovascular   REMOVAL OF A DIALYSIS CATHETER N/A 11/03/2020   Procedure: REMOVAL OF A DIALYSIS CATHETER;  Surgeon: Algernon Huxley, MD;  Location: [facility] ORS;  Service: Vascular;  Laterality: N/A;   Family History  Problem Relation Age of Onset   Heart attack Father 79       died first MI at age 56   Heart failure Mother    Social History   Tobacco Use   Smoking status: Former    Types: Pipe, Cigarettes    Quit date: 1992    Years since quitting: 30.9   Smokeless tobacco: Never  Vaping Use   Vaping Use: Never used  Substance Use Topics   Alcohol use: Not Currently    Alcohol/week: 1.0 standard drink    Types: 1 Cans of beer per week    Comment: occasionally drinking only,once a month   Drug use: No    Pertinent Clinical Results:  LABS: Labs reviewed: Acceptable for surgery.  Hospital Outpatient Visit on 04/10/2021  Component Date Value Ref Range Status   WBC 04/10/2021 5.8  4.0 - 10.5 K/uL Final   RBC 04/10/2021 3.48 (L)  4.22 - 5.81 MIL/uL Final   Hemoglobin 04/10/2021 10.9 (L)  13.0 - 17.0 g/dL Final   HCT 04/10/2021 32.7 (L)  39.0 - 52.0 % Final   MCV 04/10/2021 94.0  80.0 - 100.0 fL Final   MCH 04/10/2021 31.3  26.0 - 34.0 pg Final   MCHC 04/10/2021 33.3  30.0 - 36.0 g/dL Final   RDW 04/10/2021 15.4  11.5 - 15.5 % Final   Platelets 04/10/2021 169  150 - 400 K/uL Final   nRBC 04/10/2021 0.0  0.0 - 0.2 % Final   Neutrophils Relative % 04/10/2021 59  % Final   Neutro Abs 04/10/2021 3.4  1.7 - 7.7 K/uL Final   Lymphocytes Relative 04/10/2021 18  % Final   Lymphs Abs 04/10/2021  1.0  0.7 - 4.0 K/uL Final   Monocytes Relative 04/10/2021 14  % Final   Monocytes Absolute 04/10/2021 0.8  0.1 - 1.0 K/uL Final   Eosinophils Relative 04/10/2021 8  % Final   Eosinophils Absolute 04/10/2021 0.5  0.0 - 0.5 K/uL Final   Basophils Relative 04/10/2021 1  % Final   Basophils Absolute 04/10/2021 0.1  0.0 - 0.1 K/uL Final   Immature Granulocytes 04/10/2021 0  % Final  Abs Immature Granulocytes 04/10/2021 0.01  0.00 - 0.07 K/uL Final   Performed at [facility], [place], [place] [address]   Sodium 04/10/2021 133 (L)  135 - 145 mmol/L Final   Potassium 04/10/2021 4.8  3.5 - 5.1 mmol/L Final   Chloride 04/10/2021 99  98 - 111 mmol/L Final   CO2 04/10/2021 25  22 - 32 mmol/L Final   Glucose, Bld 04/10/2021 104 (H)  70 - 99 mg/dL Final   Glucose reference range applies only to samples taken after fasting for at least 8 hours.   BUN 04/10/2021 47 (H)  8 - 23 mg/dL Final   Creatinine, Ser 04/10/2021 2.84 (H)  0.61 - 1.24 mg/dL Final   Calcium 04/10/2021 9.1  8.9 - 10.3 mg/dL Final   GFR, Estimated 04/10/2021 21 (L)  >60 mL/min Final   Comment: (NOTE) Calculated using the CKD-EPI Creatinine Equation (2021)    Anion gap 04/10/2021 9  5 - 15 Final   Performed at [facility], [address] [place], [place] [address]   ABO/RH(D) 04/10/2021 O POS   Final   Antibody Screen 04/10/2021 NEG   Final   Sample Expiration 04/10/2021 04/24/2021,2359   Final   Extend sample reason 04/10/2021    Final                   Value:NO TRANSFUSIONS OR PREGNANCY IN THE PAST 3 MONTHS Performed at [facility], [address] [place], [place] [address]    ECG: Date: 04/10/2021 Time ECG obtained: 1447 PM Rate: 71 bpm Rhythm:  Sinus rhythm with first-degree AV block; nonspecific IVCD Axis (leads I and aVF): Left axis deviation Intervals: PR 248 ms. QRS 126 ms. QTc 475 ms. ST segment and T wave changes: No evidence of acute ST segment elevation or  depression Comparison: Previous tracing on 01/04/2021 showed atrial fibrillation with criteria for inferior infarct; both absent on most recent tracing   IMAGING / PROCEDURES: DIAGNOSTIC RADIOGRAPHS OF CHEST 1 VIEW performed on 02/11/2021 New dual lumen dialysis type right chest catheter.  Chronic cardiomegaly and tortuosity of the thoracic aorta. Prior CABG. Stable cardiac size and mediastinal contours.  Visualized tracheal air column is within normal limits.  No pneumothorax or pulmonary edema.  The right lung remains clear.  Increased hypo ventilation at the left lung base.  Small left pleural effusion difficult to exclude. No consolidation. No acute osseous abnormality identified.  TRANSTHORACIC ECHOCARDIOGRAM performed on 01/10/2018 Left ventricular systolic function decreased with an EF of 45% Left ventricular cavity size moderately dilated Diffuse left ventricular hypokinesis Moderate aortic valve regurgitation Moderate tricuspid valve regurgitation Aortic root dilated at 3.8 cm Ascending aorta dilated at 6.8 cm  LEFT HEART CATHETERIZATION AND CORONARY ANGIOGRAPHY performed on 12/04/2013 Multivessel CAD 95% proximal LAD 100% stenosis of the LAD just after SP2 100% stenosis of the proximal LCx 30% stenosis of the proximal RCA 40% stenosis of the mid RCA Previously placed grafts LIMA-LAD with 40% disease at the anastomosis SVG-D1 patent SVG-OM1 patent SVG-OM2 patent SVG-RPDA with serial 90% ostial and 70-90% mid graft stenoses Successful PCI with overlapping stents resulting in 0% residual stenosis and restoration of TIMI-3 flow 4.0 x 38 mm Promus Premier DES placed to the mid SVG-RPDA graft 4.0 x 28 mm Promus Premier DES placed to the ostial SVG-RPDA graft  Impression and Plan:  Matthew [name] has been referred for pre-anesthesia review and clearance prior to him undergoing the planned anesthetic and procedural  courses. Available labs, pertinent testing, and imaging  results were personally reviewed by me. This patient has been appropriately cleared by cardiology with an overall ACCEPTABLE risk of significant perioperative cardiovascular complications.  Based on clinical review performed today (04/12/21), barring any significant acute changes in the patient's overall condition, it is anticipated that he will be able to proceed with the planned surgical intervention. Any acute changes in clinical condition may necessitate his procedure being postponed and/or cancelled. Patient will meet with anesthesia team (MD and/or CRNA) on the day of his procedure for preoperative evaluation/assessment. Questions regarding anesthetic course will be fielded at that time.   Pre-surgical instructions were reviewed with the patient during his PAT appointment and questions were fielded by PAT clinical staff. Patient was advised that if any questions or concerns arise prior to his procedure then he should return a call to PAT and/or his surgeon's office to discuss.  Honor Loh, MSN, APRN, FNP-C, CEN [facility]  Peri-operative Services Nurse Practitioner Phone: [phone] Fax: [phone] 04/12/21 3:41 PM  NOTE: This note has been prepared using Dragon dictation software. Despite my best ability to proofread, there is always the potential that unintentional transcriptional errors may still occur from this process.

## 2021-04-13 DIAGNOSIS — N186 End stage renal disease: Secondary | ICD-10-CM | POA: Diagnosis not present

## 2021-04-13 DIAGNOSIS — D631 Anemia in chronic kidney disease: Secondary | ICD-10-CM | POA: Diagnosis not present

## 2021-04-13 DIAGNOSIS — Z992 Dependence on renal dialysis: Secondary | ICD-10-CM | POA: Diagnosis not present

## 2021-04-14 DIAGNOSIS — Z992 Dependence on renal dialysis: Secondary | ICD-10-CM | POA: Diagnosis not present

## 2021-04-14 DIAGNOSIS — N186 End stage renal disease: Secondary | ICD-10-CM | POA: Diagnosis not present

## 2021-04-14 DIAGNOSIS — D631 Anemia in chronic kidney disease: Secondary | ICD-10-CM | POA: Diagnosis not present

## 2021-04-14 MED ORDER — FAMOTIDINE 20 MG PO TABS: 20.0000 mg | ORAL_TABLET | Freq: Once | ORAL | Status: AC

## 2021-04-14 MED ORDER — CHLORHEXIDINE GLUCONATE 0.12 % MT SOLN: 15.0000 mL | Freq: Once | OROMUCOSAL | Status: AC

## 2021-04-14 MED ORDER — ORAL CARE MOUTH RINSE: 15.0000 mL | Freq: Once | OROMUCOSAL | Status: AC

## 2021-04-14 MED ORDER — SODIUM CHLORIDE 0.9 % IV SOLN: INTRAVENOUS | Status: DC

## 2021-04-14 MED ORDER — CHLORHEXIDINE GLUCONATE CLOTH 2 % EX PADS
6.0000 | MEDICATED_PAD | Freq: Once | CUTANEOUS | Status: AC
  Administered 2021-04-15: 6 via TOPICAL

## 2021-04-14 MED ORDER — VANCOMYCIN HCL IN DEXTROSE 1-5 GM/200ML-% IV SOLN
1000.0000 mg | INTRAVENOUS | Status: AC
  Administered 2021-04-15: 1000 mg via INTRAVENOUS

## 2021-04-14 MED ORDER — CHLORHEXIDINE GLUCONATE CLOTH 2 % EX PADS: 6.0000 | MEDICATED_PAD | Freq: Once | CUTANEOUS | Status: DC

## 2021-04-15 ENCOUNTER — Ambulatory Visit: Payer: Medicare Other | Admitting: Urgent Care

## 2021-04-15 ENCOUNTER — Ambulatory Visit: Payer: Medicare Other

## 2021-04-15 ENCOUNTER — Encounter
Admission: RE | Disposition: A | Discharge status: Home / Self Care | Payer: Self-pay | Source: Home / Self Care | Attending: Vascular Surgery

## 2021-04-15 ENCOUNTER — Ambulatory Visit
Admission: RE | Admit: 2021-04-15 | Discharge: 2021-04-15 | Disposition: A | Discharge status: Home / Self Care | Payer: Medicare Other | Attending: Vascular Surgery | Admitting: Vascular Surgery

## 2021-04-15 ENCOUNTER — Encounter: Payer: Self-pay | Admitting: Vascular Surgery

## 2021-04-15 ENCOUNTER — Other Ambulatory Visit: Payer: Self-pay

## 2021-04-15 DIAGNOSIS — I132 Hypertensive heart and chronic kidney disease with heart failure and with stage 5 chronic kidney disease, or end stage renal disease: Secondary | ICD-10-CM | POA: Insufficient documentation

## 2021-04-15 DIAGNOSIS — E1122 Type 2 diabetes mellitus with diabetic chronic kidney disease: Secondary | ICD-10-CM | POA: Diagnosis not present

## 2021-04-15 DIAGNOSIS — I252 Old myocardial infarction: Secondary | ICD-10-CM | POA: Insufficient documentation

## 2021-04-15 DIAGNOSIS — E039 Hypothyroidism, unspecified: Secondary | ICD-10-CM | POA: Diagnosis not present

## 2021-04-15 DIAGNOSIS — G8918 Other acute postprocedural pain: Secondary | ICD-10-CM | POA: Diagnosis not present

## 2021-04-15 DIAGNOSIS — Z7984 Long term (current) use of oral hypoglycemic drugs: Secondary | ICD-10-CM | POA: Insufficient documentation

## 2021-04-15 DIAGNOSIS — I5022 Chronic systolic (congestive) heart failure: Secondary | ICD-10-CM | POA: Diagnosis not present

## 2021-04-15 DIAGNOSIS — Z85828 Personal history of other malignant neoplasm of skin: Secondary | ICD-10-CM | POA: Insufficient documentation

## 2021-04-15 DIAGNOSIS — I2510 Atherosclerotic heart disease of native coronary artery without angina pectoris: Secondary | ICD-10-CM | POA: Insufficient documentation

## 2021-04-15 DIAGNOSIS — D631 Anemia in chronic kidney disease: Secondary | ICD-10-CM | POA: Diagnosis not present

## 2021-04-15 DIAGNOSIS — E785 Hyperlipidemia, unspecified: Secondary | ICD-10-CM | POA: Diagnosis not present

## 2021-04-15 DIAGNOSIS — K219 Gastro-esophageal reflux disease without esophagitis: Secondary | ICD-10-CM | POA: Insufficient documentation

## 2021-04-15 DIAGNOSIS — Z992 Dependence on renal dialysis: Secondary | ICD-10-CM | POA: Insufficient documentation

## 2021-04-15 DIAGNOSIS — N185 Chronic kidney disease, stage 5: Secondary | ICD-10-CM | POA: Diagnosis not present

## 2021-04-15 DIAGNOSIS — Z87891 Personal history of nicotine dependence: Secondary | ICD-10-CM | POA: Diagnosis not present

## 2021-04-15 DIAGNOSIS — Z955 Presence of coronary angioplasty implant and graft: Secondary | ICD-10-CM | POA: Diagnosis not present

## 2021-04-15 DIAGNOSIS — I429 Cardiomyopathy, unspecified: Secondary | ICD-10-CM | POA: Insufficient documentation

## 2021-04-15 DIAGNOSIS — I4891 Unspecified atrial fibrillation: Secondary | ICD-10-CM | POA: Diagnosis not present

## 2021-04-15 DIAGNOSIS — Z951 Presence of aortocoronary bypass graft: Secondary | ICD-10-CM | POA: Diagnosis not present

## 2021-04-15 DIAGNOSIS — M1990 Unspecified osteoarthritis, unspecified site: Secondary | ICD-10-CM | POA: Insufficient documentation

## 2021-04-15 DIAGNOSIS — N186 End stage renal disease: Secondary | ICD-10-CM | POA: Diagnosis not present

## 2021-04-15 DIAGNOSIS — Z419 Encounter for procedure for purposes other than remedying health state, unspecified: Secondary | ICD-10-CM

## 2021-04-15 DIAGNOSIS — M79621 Pain in right upper arm: Secondary | ICD-10-CM | POA: Diagnosis not present

## 2021-04-15 HISTORY — DX: Edema, unspecified: R60.9

## 2021-04-15 HISTORY — PX: AV FISTULA PLACEMENT: SHX1204

## 2021-04-15 HISTORY — DX: Long term (current) use of antithrombotics/antiplatelets: Z79.02

## 2021-04-15 HISTORY — DX: Bradycardia, unspecified: R00.1

## 2021-04-15 HISTORY — DX: End stage renal disease: N18.6

## 2021-04-15 HISTORY — DX: Iron deficiency anemia, unspecified: D50.9

## 2021-04-15 HISTORY — DX: Other forms of dyspnea: R06.09

## 2021-04-15 HISTORY — DX: Cardiomyopathy, unspecified: I42.9

## 2021-04-15 HISTORY — DX: Hyperlipidemia, unspecified: E78.5

## 2021-04-15 HISTORY — DX: Type 2 diabetes mellitus without complications: E11.9

## 2021-04-15 HISTORY — DX: Localized edema: R60.0

## 2021-04-15 HISTORY — DX: Cardiac murmur, unspecified: R01.1

## 2021-04-15 HISTORY — DX: Unspecified atrial fibrillation: I48.91

## 2021-04-15 HISTORY — DX: Gastro-esophageal reflux disease without esophagitis: K21.9

## 2021-04-15 HISTORY — DX: Unspecified systolic (congestive) heart failure: I50.20

## 2021-04-15 LAB — POCT I-STAT, CHEM 8
BUN: 28 mg/dL — ABNORMAL HIGH (ref 8–23)
Calcium, Ion: 1.16 mmol/L (ref 1.15–1.40)
Chloride: 100 mmol/L (ref 98–111)
Creatinine, Ser: 2.8 mg/dL — ABNORMAL HIGH (ref 0.61–1.24)
Glucose, Bld: 95 mg/dL (ref 70–99)
HCT: 31 % — ABNORMAL LOW (ref 39.0–52.0)
Hemoglobin: 10.5 g/dL — ABNORMAL LOW (ref 13.0–17.0)
Potassium: 4.3 mmol/L (ref 3.5–5.1)
Sodium: 137 mmol/L (ref 135–145)
TCO2: 27 mmol/L (ref 22–32)

## 2021-04-15 LAB — GLUCOSE, CAPILLARY: Glucose-Capillary: 117 mg/dL — ABNORMAL HIGH (ref 70–99)

## 2021-04-15 SURGERY — INSERTION OF ARTERIOVENOUS (AV) GORE-TEX GRAFT ARM
Anesthesia: General | Laterality: Left

## 2021-04-15 MED ORDER — FAMOTIDINE 20 MG PO TABS
ORAL_TABLET | ORAL | Status: AC
  Administered 2021-04-15: 20 mg via ORAL
  Filled 2021-04-15: qty 1

## 2021-04-15 MED ORDER — HYDROMORPHONE HCL 1 MG/ML IJ SOLN: 1.0000 mg | Freq: Once | INTRAMUSCULAR | Status: DC | PRN

## 2021-04-15 MED ORDER — ROPIVACAINE HCL 5 MG/ML IJ SOLN
INTRAMUSCULAR | Status: DC | PRN
  Administered 2021-04-15: 30 mL via PERINEURAL

## 2021-04-15 MED ORDER — PROPOFOL 1000 MG/100ML IV EMUL
INTRAVENOUS | Status: AC
  Filled 2021-04-15: qty 200

## 2021-04-15 MED ORDER — HEPARIN SODIUM (PORCINE) 5000 UNIT/ML IJ SOLN
INTRAMUSCULAR | Status: AC
  Filled 2021-04-15: qty 1

## 2021-04-15 MED ORDER — CHLORHEXIDINE GLUCONATE 0.12 % MT SOLN
OROMUCOSAL | Status: AC
  Administered 2021-04-15: 15 mL via OROMUCOSAL
  Filled 2021-04-15: qty 15

## 2021-04-15 MED ORDER — FENTANYL CITRATE PF 50 MCG/ML IJ SOSY
PREFILLED_SYRINGE | INTRAMUSCULAR | Status: AC
  Administered 2021-04-15: 50 ug via INTRAVENOUS
  Filled 2021-04-15: qty 2

## 2021-04-15 MED ORDER — PHENYLEPHRINE HCL-NACL 20-0.9 MG/250ML-% IV SOLN
INTRAVENOUS | Status: DC | PRN
  Administered 2021-04-15: 20 ug/min via INTRAVENOUS

## 2021-04-15 MED ORDER — "VISTASEAL 4 ML SINGLE DOSE KIT "
PACK | CUTANEOUS | Status: DC | PRN
  Administered 2021-04-15: 4 mL via TOPICAL

## 2021-04-15 MED ORDER — PHENYLEPHRINE HCL-NACL 20-0.9 MG/250ML-% IV SOLN
INTRAVENOUS | Status: AC
  Filled 2021-04-15: qty 250

## 2021-04-15 MED ORDER — MIDAZOLAM HCL 2 MG/2ML IJ SOLN
INTRAMUSCULAR | Status: AC
  Administered 2021-04-15: 1 mg via INTRAVENOUS
  Filled 2021-04-15: qty 2

## 2021-04-15 MED ORDER — PROPOFOL 10 MG/ML IV BOLUS
INTRAVENOUS | Status: DC | PRN
  Administered 2021-04-15: 30 mg via INTRAVENOUS

## 2021-04-15 MED ORDER — EPHEDRINE SULFATE 50 MG/ML IJ SOLN
INTRAMUSCULAR | Status: DC | PRN
  Administered 2021-04-15: 5 mg via INTRAVENOUS
  Administered 2021-04-15: 10 mg via INTRAVENOUS
  Administered 2021-04-15 (×2): 5 mg via INTRAVENOUS

## 2021-04-15 MED ORDER — EPHEDRINE 5 MG/ML INJ
INTRAVENOUS | Status: AC
  Filled 2021-04-15: qty 5

## 2021-04-15 MED ORDER — HEPARIN SODIUM (PORCINE) 1000 UNIT/ML IJ SOLN
INTRAMUSCULAR | Status: DC | PRN
  Administered 2021-04-15: 3000 [IU] via INTRAVENOUS

## 2021-04-15 MED ORDER — HEMOSTATIC AGENTS (NO CHARGE) OPTIME
TOPICAL | Status: DC | PRN
  Administered 2021-04-15: 2 via TOPICAL

## 2021-04-15 MED ORDER — ACETAMINOPHEN 10 MG/ML IV SOLN
INTRAVENOUS | Status: DC | PRN
  Administered 2021-04-15: 1000 mg via INTRAVENOUS

## 2021-04-15 MED ORDER — LIDOCAINE HCL (PF) 1 % IJ SOLN
INTRAMUSCULAR | Status: AC
  Filled 2021-04-15: qty 5

## 2021-04-15 MED ORDER — ONDANSETRON HCL 4 MG/2ML IJ SOLN: 4.0000 mg | Freq: Four times a day (QID) | INTRAMUSCULAR | Status: DC | PRN

## 2021-04-15 MED ORDER — FENTANYL CITRATE (PF) 100 MCG/2ML IJ SOLN: 25.0000 ug | INTRAMUSCULAR | Status: DC | PRN

## 2021-04-15 MED ORDER — MIDAZOLAM HCL 2 MG/2ML IJ SOLN: 1.0000 mg | Freq: Once | INTRAMUSCULAR | Status: AC

## 2021-04-15 MED ORDER — HYDROCODONE-ACETAMINOPHEN 5-325 MG PO TABS: 2.0000 | ORAL_TABLET | Freq: Four times a day (QID) | ORAL | 0 refills | Status: AC | PRN

## 2021-04-15 MED ORDER — PHENYLEPHRINE HCL (PRESSORS) 10 MG/ML IV SOLN
INTRAVENOUS | Status: DC | PRN
  Administered 2021-04-15: 160 ug via INTRAVENOUS
  Administered 2021-04-15: 80 ug via INTRAVENOUS

## 2021-04-15 MED ORDER — VANCOMYCIN HCL IN DEXTROSE 1-5 GM/200ML-% IV SOLN
INTRAVENOUS | Status: AC
  Filled 2021-04-15: qty 200

## 2021-04-15 MED ORDER — ONDANSETRON HCL 4 MG/2ML IJ SOLN: 4.0000 mg | Freq: Once | INTRAMUSCULAR | Status: DC | PRN

## 2021-04-15 MED ORDER — FENTANYL CITRATE PF 50 MCG/ML IJ SOSY
PREFILLED_SYRINGE | INTRAMUSCULAR | Status: AC
  Filled 2021-04-15: qty 1

## 2021-04-15 MED ORDER — 0.9 % SODIUM CHLORIDE (POUR BTL) OPTIME
TOPICAL | Status: DC | PRN
  Administered 2021-04-15: 500 mL

## 2021-04-15 MED ORDER — BUPIVACAINE-EPINEPHRINE (PF) 0.5% -1:200000 IJ SOLN
INTRAMUSCULAR | Status: AC
  Filled 2021-04-15: qty 30

## 2021-04-15 MED ORDER — HEPARIN 5000 UNITS IN NS 1000 ML (FLUSH)
INTRAMUSCULAR | Status: DC | PRN
  Administered 2021-04-15: 1 mL via INTRAMUSCULAR

## 2021-04-15 MED ORDER — PROPOFOL 500 MG/50ML IV EMUL
INTRAVENOUS | Status: DC | PRN
  Administered 2021-04-15: 75 ug/kg/min via INTRAVENOUS

## 2021-04-15 MED ORDER — FENTANYL CITRATE PF 50 MCG/ML IJ SOSY: 50.0000 ug | PREFILLED_SYRINGE | Freq: Once | INTRAMUSCULAR | Status: AC

## 2021-04-15 MED ORDER — ROPIVACAINE HCL 5 MG/ML IJ SOLN
INTRAMUSCULAR | Status: AC
  Filled 2021-04-15: qty 30

## 2021-04-15 SURGICAL SUPPLY — 59 items
ADH SKN CLS APL DERMABOND .7 (GAUZE/BANDAGES/DRESSINGS) ×2
APL PRP STRL LF DISP 70% ISPRP (MISCELLANEOUS) ×1
BAG DECANTER FOR FLEXI CONT (MISCELLANEOUS) ×2 IMPLANT
BLADE SURG SZ11 CARB STEEL (BLADE) ×2 IMPLANT
BOOT SUTURE AID YELLOW STND (SUTURE) ×2 IMPLANT
BRUSH SCRUB EZ  4% CHG (MISCELLANEOUS) ×2
BRUSH SCRUB EZ 4% CHG (MISCELLANEOUS) ×1 IMPLANT
CHLORAPREP W/TINT 26 (MISCELLANEOUS) ×2 IMPLANT
CLIP SPRNG 6MM S-JAW DBL (CLIP) ×2
DECANTER SPIKE VIAL GLASS SM (MISCELLANEOUS) ×2 IMPLANT
DERMABOND ADVANCED (GAUZE/BANDAGES/DRESSINGS) ×2
DERMABOND ADVANCED .7 DNX12 (GAUZE/BANDAGES/DRESSINGS) ×2 IMPLANT
DRSG OPSITE POSTOP 4X6 (GAUZE/BANDAGES/DRESSINGS) ×4 IMPLANT
ELECT CAUTERY BLADE 6.4 (BLADE) ×2 IMPLANT
ELECT REM PT RETURN 9FT ADLT (ELECTROSURGICAL) ×2
ELECTRODE REM PT RTRN 9FT ADLT (ELECTROSURGICAL) ×1 IMPLANT
GAUZE 4X4 16PLY ~~LOC~~+RFID DBL (SPONGE) ×2 IMPLANT
GLOVE SURG ENC MOIS LTX SZ7 (GLOVE) ×2 IMPLANT
GLOVE SURG SYN 7.0 (GLOVE) ×2 IMPLANT
GLOVE SURG UNDER LTX SZ7.5 (GLOVE) ×2 IMPLANT
GOWN STRL REUS W/ TWL LRG LVL3 (GOWN DISPOSABLE) ×1 IMPLANT
GOWN STRL REUS W/ TWL XL LVL3 (GOWN DISPOSABLE) ×2 IMPLANT
GOWN STRL REUS W/TWL LRG LVL3 (GOWN DISPOSABLE) ×2
GOWN STRL REUS W/TWL XL LVL3 (GOWN DISPOSABLE) ×4
GRAFT PROPATEN STD WALL 6X40 (Vascular Products) ×2 IMPLANT
HEMOSTAT SURGICEL 2X3 (HEMOSTASIS) ×4 IMPLANT
IV NS 500ML (IV SOLUTION) ×2
IV NS 500ML BAXH (IV SOLUTION) ×1 IMPLANT
KIT TURNOVER KIT A (KITS) ×2 IMPLANT
LABEL OR SOLS (LABEL) ×2 IMPLANT
LOOP RED MAXI  1X406MM (MISCELLANEOUS) ×1
LOOP VESSEL MAXI  1X406 RED (MISCELLANEOUS) ×1
LOOP VESSEL MAXI 1X406 RED (MISCELLANEOUS) ×1 IMPLANT
LOOP VESSEL MINI 0.8X406 BLUE (MISCELLANEOUS) ×1 IMPLANT
LOOPS BLUE MINI 0.8X406MM (MISCELLANEOUS) ×1
MANIFOLD NEPTUNE II (INSTRUMENTS) ×2 IMPLANT
NEEDLE FILTER BLUNT 18X 1/2SAF (NEEDLE) ×1
NEEDLE FILTER BLUNT 18X1 1/2 (NEEDLE) ×1 IMPLANT
NS IRRIG 500ML POUR BTL (IV SOLUTION) ×2 IMPLANT
PACK EXTREMITY ARMC (MISCELLANEOUS) ×2 IMPLANT
PAD PREP 24X41 OB/GYN DISP (PERSONAL CARE ITEMS) ×2 IMPLANT
SOLUTION CELL SAVER (CLIP) ×1 IMPLANT
STOCKINETTE STRL 4IN 9604848 (GAUZE/BANDAGES/DRESSINGS) ×2 IMPLANT
SUT GORETEX 6.0 TT9 (SUTURE) ×2 IMPLANT
SUT GORETEX CV-6TTC-13 36IN (SUTURE) IMPLANT
SUT MNCRL AB 4-0 PS2 18 (SUTURE) ×2 IMPLANT
SUT PROLENE 6 0 BV (SUTURE) ×6 IMPLANT
SUT SILK 0 SH 30 (SUTURE) ×2 IMPLANT
SUT SILK 2 0 (SUTURE)
SUT SILK 2-0 18XBRD TIE 12 (SUTURE) IMPLANT
SUT SILK 3 0 (SUTURE)
SUT SILK 3-0 18XBRD TIE 12 (SUTURE) IMPLANT
SUT SILK 4 0 (SUTURE)
SUT SILK 4-0 18XBRD TIE 12 (SUTURE) IMPLANT
SUT VIC AB 3-0 SH 27 (SUTURE) ×2
SUT VIC AB 3-0 SH 27X BRD (SUTURE) ×1 IMPLANT
SYR 20ML LL LF (SYRINGE) ×2 IMPLANT
SYR 3ML LL SCALE MARK (SYRINGE) ×2 IMPLANT
WATER STERILE IRR 500ML POUR (IV SOLUTION) ×2 IMPLANT

## 2021-04-15 NOTE — Anesthesia Preprocedure Evaluation (Addendum)
Anesthesia Evaluation  Patient identified by MRN, date of birth, ID band Patient awake    Reviewed: Allergy & Precautions, H&P , NPO status , Patient's Chart, lab work & pertinent test results, reviewed documented beta blocker date and time   Airway Mallampati: III  TM Distance: >3 FB Neck ROM: Full    Dental  (+) Partial Upper, Partial Lower, Chipped, Poor Dentition, Missing   Pulmonary shortness of breath, pneumonia, resolved, former smoker,    Pulmonary exam normal        Cardiovascular hypertension, Pt. on medications and Pt. on home beta blockers + angina with exertion + CAD, + Past MI, + Cardiac Stents, + CABG, +CHF and + DOE  Normal cardiovascular exam+ Valvular Problems/Murmurs AI   Left ventricle: The cavity size was moderately dilated. The  estimated ejection fraction was 45%. Diffuse hypokinesis.  - Aortic valve: There was moderate regurgitation.  - Right ventricle: The cavity size was moderately dilated.  - Atrial septum: No defect or patent foramen ovale was identified.  - Tricuspid valve: There was moderate regurgitation.    Neuro/Psych    GI/Hepatic GERD  ,  Endo/Other  diabetes, Well Controlled, Oral Hypoglycemic AgentsHypothyroidism   Renal/GU ESRF and DialysisRenal disease     Musculoskeletal  (+) Arthritis , Osteoarthritis,    Abdominal   Peds  Hematology  (+) anemia ,   Anesthesia Other Findings Anemia    Anginal pain (HCC)    Arthritis    Broken arm 05/2013 Left  CHF (congestive heart failure) ([place]) Chronic kidney disease  Coronary artery disease   Diabetes mellitus without complication Dyspnea  DOE  Dysrhythmia    Edema  FEET/LEGS OCCAS  Hypertension    Myocardial infarction ([place])   Pneumonia    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 left frontal scalp, EDC  Squamous cell carcinoma of skin 05/05/2020 left temporal scalp, in situ, [facility] 05/13/20   01/10/18 ECHO - Left ventricle: The  cavity size was moderately dilated. The  estimated ejection fraction was 45%. Diffuse hypokinesis.  - Aortic valve: There was moderate regurgitation.  - Right ventricle: The cavity size was moderately dilated.  - Atrial septum: No defect or patent foramen ovale was identified.  - Tricuspid valve: There was moderate regurgitation.     Reproductive/Obstetrics                            Anesthesia Physical  Anesthesia Plan  ASA: 4  Anesthesia Plan: General   Post-op Pain Management: Regional block   Induction: Intravenous  PONV Risk Score and Plan: 2 and TIVA, Propofol infusion and Midazolam  Airway Management Planned: Natural Airway and Nasal Cannula  Additional Equipment:   Intra-op Plan:   Post-operative Plan:   Informed Consent: I have reviewed the patients History and Physical, chart, labs and discussed the procedure including the risks, benefits and alternatives for the proposed anesthesia with the patient or authorized representative who has indicated his/her understanding and acceptance.     Dental Advisory Given  Plan Discussed with: CRNA, Anesthesiologist and Surgeon  Anesthesia Plan Comments:       Anesthesia Quick Evaluation

## 2021-04-15 NOTE — Discharge Instructions (Signed)

## 2021-04-15 NOTE — Interval H&P Note (Signed)
History and Physical Interval Note:  04/15/2021 7:13 AM  Matthew [name]  has presented today for surgery, with the diagnosis of ESRD.  The various methods of treatment have been discussed with the patient and family. After consideration of risks, benefits and other options for treatment, the patient has consented to  Procedure(s): INSERTION OF ARTERIOVENOUS (AV) GORE-TEX GRAFT ARM ( BRACHIAL AXILLARY) (Left) as a surgical intervention.  The patient's history has been reviewed, patient examined, no change in status, stable for surgery.  I have reviewed the patient's chart and labs.  Questions were answered to the patient's satisfaction.     [name]

## 2021-04-15 NOTE — Transfer of Care (Signed)
Immediate Anesthesia Transfer of Care Note  Patient: Matthew [name]  Procedure(s) Performed: INSERTION OF ARTERIOVENOUS (AV) GORE-TEX GRAFT ARM ( BRACHIAL AXILLARY) (Left)  Patient Location: PACU  Anesthesia Type:General and Regional  Level of Consciousness: drowsy  Airway & Oxygen Therapy: Patient Spontanous Breathing  Post-op Assessment: Report given to RN and Post -op Vital signs reviewed and stable  Post vital signs: Reviewed and stable  Last Vitals:  Vitals Value Taken Time  BP 89/43 04/15/21 0923  Temp    Pulse 59 04/15/21 0926  Resp 13 04/15/21 0926  SpO2 96 % 04/15/21 0926  Vitals shown include unvalidated device data.  Last Pain:  Vitals:   04/15/21 0725  TempSrc: Temporal  PainSc: 0-No pain         Complications: No notable events documented.

## 2021-04-15 NOTE — Anesthesia Postprocedure Evaluation (Signed)
Anesthesia Post Note  Patient: MYLAN SCHWARZ  Procedure(s) Performed: INSERTION OF ARTERIOVENOUS (AV) GORE-TEX GRAFT ARM ( BRACHIAL AXILLARY) (Left)  Patient location during evaluation: PACU Anesthesia Type: General Level of consciousness: awake and alert, awake and oriented Pain management: pain level controlled Vital Signs Assessment: post-procedure vital signs reviewed and stable Respiratory status: spontaneous breathing, nonlabored ventilation and respiratory function stable Cardiovascular status: blood pressure returned to baseline and stable Postop Assessment: no apparent nausea or vomiting Anesthetic complications: no   No notable events documented.   Last Vitals:  Vitals:   04/15/21 1016 04/15/21 1028  BP: (!) 103/51 (!) 96/53  Pulse: 60 63  Resp: 17 18  Temp:  (!) 36.1 C  SpO2: 98% 95%    Last Pain:  Vitals:   04/15/21 1028  TempSrc: Temporal  PainSc: 0-No pain                 Phill Mutter

## 2021-04-15 NOTE — Anesthesia Procedure Notes (Signed)
Anesthesia Regional Block: Supraclavicular block   Pre-Anesthetic Checklist: , timeout performed,  Correct Patient, Correct Site, Correct Laterality,  Correct Procedure, Correct Position, site marked,  Risks and benefits discussed,  Surgical consent,  Pre-op evaluation,  At surgeon's request and post-op pain management  Laterality: Upper and Right  Prep: chloraprep       Needles:  Injection technique: Single-shot  Needle Type: Stimiplex     Needle Length: 9cm  Needle Gauge: 22   Needle insertion depth: 3 cm   Additional Needles:   Procedures:, nerve stimulator,,, ultrasound used (permanent image in chart),,     Nerve Stimulator or Paresthesia:  Response: R Hand, 0.6 mA, 2 ms, 3 cm  Additional Responses:   Narrative:  Start time: 04/15/2021 7:11 AM End time: 04/15/2021 7:17 AM Injection made incrementally with aspirations every 5 mL.  Performed by: Personally  Anesthesiologist: Piscitello, Precious Haws, MD  Additional Notes: Patient consented for risk and benefits of nerve block including but not limited to nerve damage, failed block, bleeding and infection.  Patient voiced understanding.  Functioning IV was confirmed and monitors were applied.  Timeout done prior to procedure and prior to any sedation being given to the patient.  Patient confirmed procedure site prior to any sedation given to the patient.  A 37mm 22ga Stimuplex needle was used. Sterile prep,hand hygiene and sterile gloves were used.  Minimal sedation used for procedure.  IVCS With Fent 50 mcg + Versed 1 mg. No paresthesia endorsed by patient during the procedure.  Negative aspiration and negative test dose prior to incremental administration of local anesthetic. The patient tolerated the procedure well with no immediate complications.  Injectate:  0.5% Ropivacaine

## 2021-04-15 NOTE — Op Note (Signed)
[facility]  OPERATIVE NOTE   PROCEDURE:  Left upper arm brachial artery to axillary vein arteriovenous graft with 6 mm Propaten graft  PRE-OPERATIVE DIAGNOSIS: 1. end stage renal disease    POST-OPERATIVE DIAGNOSIS: same  SURGEON: [name]  ASSISTANT(S): Hezzie Bump, PA-C  ANESTHESIA: Regional block  ESTIMATED BLOOD LOSS: 25 cc  FINDING(S): none  SPECIMEN(S):  None  INDICATIONS:   Matthew [name] is a 87 y.o. male who presents with end stage renal disease and need for permanent access.  Risk, benefits, and alternatives to access surgery were discussed.  The patient is aware the risks include but are not limited to: bleeding, infection, steal syndrome, nerve damage, ischemic monomelic neuropathy, failure to mature, and need for additional procedures.  The patient is aware of the risks and elects to proceed forward. An assistant was present during the procedure to help facilitate the exposure and expedite the procedure.   DESCRIPTION: After full informed written consent was obtained from the patient, the patient was brought back to the operating room and placed supine upon the operating table.  The patient was given IV antibiotics prior to proceeding. The assistant provided retraction and mobilization to help facilitate exposure and expedite the procedure throughout the entire procedure.  This included following suture, using retractors, and optimizing lighting.  After obtaining adequate sedation, the patient was prepped and draped in standard fashion for a left arm access procedure.  I turned my attention first to the antecubitum.   I made an incision over the brachial artery, and dissected down through the subcutaneous tissue to the fascia carefully and was able to dissect out the brachial artery.  The artery was about patent and of adequate size to support a graft.  It was controlled proximally and distally with vessel loops and then I turned my attention  to the high bicipital groove in the axilla.  I made an incision and dissected down through the subcutaneous tissue and fascia until I reached the axillary vein.  It was patent and adequate size for graft creation.  I then dissected this vein proximally and distal and prepared it for control with Bulldog clamps.  I took a Dietitian and dissected from the antecubital up to the axillary incision.  Then I delivered the 6 mm Propaten pTFE graft, through this metal tunneler and then pulled out the metal tunneler leaving the graft in place and making sure the line was up for orientation.  I then gave the patient 3000 units of heparin to gain some anticoagulation.  After waiting 3 minutes, I placed the brachial artery under tension proximally and distally with vessel loops, made an arteriotomy and extended it with a Potts scissor.  I sewed the graft to this arteriotomy with a running stitch of CV-6 suture.  At this point, then I completed the anastomosis in the usual fashion.  I released the vessel loops on the inflow and allowed the artery to decompress through the graft. There was good pulsatile bleeding through this graft.  I clamped the graft near its arterial anastomosis and sucked out all the blood in the graft and loaded the graft with heparinized saline.  At this point, I pulled the graft to appropriate length and reset my exposure of the axillary vein.  Then, I controlled the vein with Bulldog clamps.  An anterior wall venotomy was created with an 11 blade and extended with Potts scissors.  I then spatulated the graft to facilitate an end-to- side  anastomosis matching the arteriotomy.  In the process of spatulating, I cut the graft to appropriate length for this anastomosis.  This graft was sewn to the vein in an end-to-side configuration with a running CV-6 suture.  Prior to completing this anastomosis, I allowed the vein to back bleed and then I also allowed the artery to bleed in an antegrade fashion.   I completed this anastomosis in the usual fashion, and then irrigated out the high bicipital exposure and then placed Surgicel and Evicel.  I then turned my attention back to the antecubitum.  There was pulsatile flow in the artery beyond the graft.   At this point, I washed out the antecubital incision. Surgicel and Evicel were then placed. There was no more active bleeding.  The subcutaneous tissue was reapproximated with a running stitch of 3-0 Vicryl.  The skin was then reapproximated with a running subcuticular 4-0 Monocryl.  The skin was then cleaned, dried, and Dermabond used to reinforce the skin closure.  We then turned our attention to the axillary incision.  The subcutaneous tissue was repaired with running stitch of 3-0 Vicryl.  The skin was then reapproximated with running subcuticular 4-0 Monocryl.  The skin was then cleaned, dried, and then the skin closure was reinforced with Dermabond.  The patient was then awakened from anesthesia and taken to the recovery room in stable condition having tolerated the procedure well.    COMPLICATIONS: None  CONDITION: Stable   [name] 04/15/2021 9:12 AM   This note was created with Dragon Medical transcription system. Any errors in dictation are purely unintentional.

## 2021-04-16 DIAGNOSIS — I480 Paroxysmal atrial fibrillation: Secondary | ICD-10-CM | POA: Diagnosis not present

## 2021-04-16 DIAGNOSIS — E039 Hypothyroidism, unspecified: Secondary | ICD-10-CM | POA: Diagnosis not present

## 2021-04-16 DIAGNOSIS — N186 End stage renal disease: Secondary | ICD-10-CM | POA: Diagnosis not present

## 2021-04-16 DIAGNOSIS — M6281 Muscle weakness (generalized): Secondary | ICD-10-CM | POA: Diagnosis not present

## 2021-04-16 DIAGNOSIS — W19XXXD Unspecified fall, subsequent encounter: Secondary | ICD-10-CM | POA: Diagnosis not present

## 2021-04-16 DIAGNOSIS — E7800 Pure hypercholesterolemia, unspecified: Secondary | ICD-10-CM | POA: Diagnosis not present

## 2021-04-16 DIAGNOSIS — D631 Anemia in chronic kidney disease: Secondary | ICD-10-CM | POA: Diagnosis not present

## 2021-04-16 DIAGNOSIS — B028 Zoster with other complications: Secondary | ICD-10-CM | POA: Diagnosis not present

## 2021-04-16 DIAGNOSIS — I712 Thoracic aortic aneurysm, without rupture, unspecified: Secondary | ICD-10-CM | POA: Diagnosis not present

## 2021-04-16 DIAGNOSIS — I252 Old myocardial infarction: Secondary | ICD-10-CM | POA: Diagnosis not present

## 2021-04-16 DIAGNOSIS — K7460 Unspecified cirrhosis of liver: Secondary | ICD-10-CM | POA: Diagnosis not present

## 2021-04-16 DIAGNOSIS — I132 Hypertensive heart and chronic kidney disease with heart failure and with stage 5 chronic kidney disease, or end stage renal disease: Secondary | ICD-10-CM | POA: Diagnosis not present

## 2021-04-16 DIAGNOSIS — I5042 Chronic combined systolic (congestive) and diastolic (congestive) heart failure: Secondary | ICD-10-CM | POA: Diagnosis not present

## 2021-04-16 DIAGNOSIS — D61818 Other pancytopenia: Secondary | ICD-10-CM | POA: Diagnosis not present

## 2021-04-16 DIAGNOSIS — J309 Allergic rhinitis, unspecified: Secondary | ICD-10-CM | POA: Diagnosis not present

## 2021-04-16 DIAGNOSIS — Z992 Dependence on renal dialysis: Secondary | ICD-10-CM | POA: Diagnosis not present

## 2021-04-16 DIAGNOSIS — F1090 Alcohol use, unspecified, uncomplicated: Secondary | ICD-10-CM | POA: Diagnosis not present

## 2021-04-16 DIAGNOSIS — I69398 Other sequelae of cerebral infarction: Secondary | ICD-10-CM | POA: Diagnosis not present

## 2021-04-16 DIAGNOSIS — K219 Gastro-esophageal reflux disease without esophagitis: Secondary | ICD-10-CM | POA: Diagnosis not present

## 2021-04-16 DIAGNOSIS — M1990 Unspecified osteoarthritis, unspecified site: Secondary | ICD-10-CM | POA: Diagnosis not present

## 2021-04-16 DIAGNOSIS — M810 Age-related osteoporosis without current pathological fracture: Secondary | ICD-10-CM | POA: Diagnosis not present

## 2021-04-16 DIAGNOSIS — I951 Orthostatic hypotension: Secondary | ICD-10-CM | POA: Diagnosis not present

## 2021-04-16 DIAGNOSIS — I2510 Atherosclerotic heart disease of native coronary artery without angina pectoris: Secondary | ICD-10-CM | POA: Diagnosis not present

## 2021-04-16 DIAGNOSIS — S3282XD Multiple fractures of pelvis without disruption of pelvic ring, subsequent encounter for fracture with routine healing: Secondary | ICD-10-CM | POA: Diagnosis not present

## 2021-04-16 DIAGNOSIS — E1122 Type 2 diabetes mellitus with diabetic chronic kidney disease: Secondary | ICD-10-CM | POA: Diagnosis not present

## 2021-04-18 DIAGNOSIS — D631 Anemia in chronic kidney disease: Secondary | ICD-10-CM | POA: Diagnosis not present

## 2021-04-18 DIAGNOSIS — N186 End stage renal disease: Secondary | ICD-10-CM | POA: Diagnosis not present

## 2021-04-18 DIAGNOSIS — Z992 Dependence on renal dialysis: Secondary | ICD-10-CM | POA: Diagnosis not present

## 2021-04-20 DIAGNOSIS — I10 Essential (primary) hypertension: Secondary | ICD-10-CM | POA: Diagnosis not present

## 2021-04-20 DIAGNOSIS — I340 Nonrheumatic mitral (valve) insufficiency: Secondary | ICD-10-CM | POA: Diagnosis not present

## 2021-04-20 DIAGNOSIS — I509 Heart failure, unspecified: Secondary | ICD-10-CM | POA: Diagnosis not present

## 2021-04-20 DIAGNOSIS — I2510 Atherosclerotic heart disease of native coronary artery without angina pectoris: Secondary | ICD-10-CM | POA: Diagnosis not present

## 2021-04-20 DIAGNOSIS — I25810 Atherosclerosis of coronary artery bypass graft(s) without angina pectoris: Secondary | ICD-10-CM | POA: Diagnosis not present

## 2021-04-20 DIAGNOSIS — Z9861 Coronary angioplasty status: Secondary | ICD-10-CM | POA: Diagnosis not present

## 2021-04-20 DIAGNOSIS — E785 Hyperlipidemia, unspecified: Secondary | ICD-10-CM | POA: Diagnosis not present

## 2021-04-20 DIAGNOSIS — I4891 Unspecified atrial fibrillation: Secondary | ICD-10-CM | POA: Diagnosis not present

## 2021-04-20 DIAGNOSIS — E663 Overweight: Secondary | ICD-10-CM | POA: Diagnosis not present

## 2021-04-20 DIAGNOSIS — E119 Type 2 diabetes mellitus without complications: Secondary | ICD-10-CM | POA: Diagnosis not present

## 2021-04-21 DIAGNOSIS — D631 Anemia in chronic kidney disease: Secondary | ICD-10-CM | POA: Diagnosis not present

## 2021-04-21 DIAGNOSIS — Z992 Dependence on renal dialysis: Secondary | ICD-10-CM | POA: Diagnosis not present

## 2021-04-21 DIAGNOSIS — N186 End stage renal disease: Secondary | ICD-10-CM | POA: Diagnosis not present

## 2021-04-22 DIAGNOSIS — Z992 Dependence on renal dialysis: Secondary | ICD-10-CM | POA: Diagnosis not present

## 2021-04-22 DIAGNOSIS — N186 End stage renal disease: Secondary | ICD-10-CM | POA: Diagnosis not present

## 2021-04-23 DIAGNOSIS — Z992 Dependence on renal dialysis: Secondary | ICD-10-CM | POA: Diagnosis not present

## 2021-04-23 DIAGNOSIS — D631 Anemia in chronic kidney disease: Secondary | ICD-10-CM | POA: Diagnosis not present

## 2021-04-23 DIAGNOSIS — N186 End stage renal disease: Secondary | ICD-10-CM | POA: Diagnosis not present

## 2021-04-24 DIAGNOSIS — E785 Hyperlipidemia, unspecified: Secondary | ICD-10-CM | POA: Diagnosis not present

## 2021-04-24 DIAGNOSIS — I2510 Atherosclerotic heart disease of native coronary artery without angina pectoris: Secondary | ICD-10-CM | POA: Diagnosis not present

## 2021-04-24 DIAGNOSIS — I4891 Unspecified atrial fibrillation: Secondary | ICD-10-CM | POA: Diagnosis not present

## 2021-04-24 DIAGNOSIS — E1122 Type 2 diabetes mellitus with diabetic chronic kidney disease: Secondary | ICD-10-CM | POA: Diagnosis not present

## 2021-04-24 DIAGNOSIS — B029 Zoster without complications: Secondary | ICD-10-CM | POA: Diagnosis not present

## 2021-04-24 DIAGNOSIS — I498 Other specified cardiac arrhythmias: Secondary | ICD-10-CM | POA: Diagnosis not present

## 2021-04-24 DIAGNOSIS — I10 Essential (primary) hypertension: Secondary | ICD-10-CM | POA: Diagnosis not present

## 2021-04-24 DIAGNOSIS — E032 Hypothyroidism due to medicaments and other exogenous substances: Secondary | ICD-10-CM | POA: Diagnosis not present

## 2021-04-24 DIAGNOSIS — I351 Nonrheumatic aortic (valve) insufficiency: Secondary | ICD-10-CM | POA: Diagnosis not present

## 2021-04-24 DIAGNOSIS — G4700 Insomnia, unspecified: Secondary | ICD-10-CM | POA: Diagnosis not present

## 2021-04-24 DIAGNOSIS — D649 Anemia, unspecified: Secondary | ICD-10-CM | POA: Diagnosis not present

## 2021-04-24 DIAGNOSIS — L89151 Pressure ulcer of sacral region, stage 1: Secondary | ICD-10-CM | POA: Diagnosis not present

## 2021-04-25 DIAGNOSIS — Z992 Dependence on renal dialysis: Secondary | ICD-10-CM | POA: Diagnosis not present

## 2021-04-25 DIAGNOSIS — D631 Anemia in chronic kidney disease: Secondary | ICD-10-CM | POA: Diagnosis not present

## 2021-04-25 DIAGNOSIS — N186 End stage renal disease: Secondary | ICD-10-CM | POA: Diagnosis not present

## 2021-04-28 DIAGNOSIS — D631 Anemia in chronic kidney disease: Secondary | ICD-10-CM | POA: Diagnosis not present

## 2021-04-28 DIAGNOSIS — N186 End stage renal disease: Secondary | ICD-10-CM | POA: Diagnosis not present

## 2021-04-28 DIAGNOSIS — Z992 Dependence on renal dialysis: Secondary | ICD-10-CM | POA: Diagnosis not present

## 2021-04-30 DIAGNOSIS — N186 End stage renal disease: Secondary | ICD-10-CM | POA: Diagnosis not present

## 2021-04-30 DIAGNOSIS — D631 Anemia in chronic kidney disease: Secondary | ICD-10-CM | POA: Diagnosis not present

## 2021-04-30 DIAGNOSIS — Z992 Dependence on renal dialysis: Secondary | ICD-10-CM | POA: Diagnosis not present

## 2021-05-02 DIAGNOSIS — Z992 Dependence on renal dialysis: Secondary | ICD-10-CM | POA: Diagnosis not present

## 2021-05-02 DIAGNOSIS — N186 End stage renal disease: Secondary | ICD-10-CM | POA: Diagnosis not present

## 2021-05-02 DIAGNOSIS — D631 Anemia in chronic kidney disease: Secondary | ICD-10-CM | POA: Diagnosis not present

## 2021-05-05 ENCOUNTER — Other Ambulatory Visit (INDEPENDENT_AMBULATORY_CARE_PROVIDER_SITE_OTHER): Payer: Self-pay | Admitting: Nurse Practitioner

## 2021-05-05 DIAGNOSIS — Z95828 Presence of other vascular implants and grafts: Secondary | ICD-10-CM

## 2021-05-05 DIAGNOSIS — N186 End stage renal disease: Secondary | ICD-10-CM

## 2021-05-05 DIAGNOSIS — Z992 Dependence on renal dialysis: Secondary | ICD-10-CM | POA: Diagnosis not present

## 2021-05-05 DIAGNOSIS — D631 Anemia in chronic kidney disease: Secondary | ICD-10-CM | POA: Diagnosis not present

## 2021-05-06 ENCOUNTER — Encounter (INDEPENDENT_AMBULATORY_CARE_PROVIDER_SITE_OTHER): Payer: Self-pay | Admitting: Nurse Practitioner

## 2021-05-06 ENCOUNTER — Other Ambulatory Visit: Payer: Self-pay

## 2021-05-06 ENCOUNTER — Ambulatory Visit (INDEPENDENT_AMBULATORY_CARE_PROVIDER_SITE_OTHER): Payer: Medicare Other | Admitting: Nurse Practitioner

## 2021-05-06 ENCOUNTER — Ambulatory Visit (INDEPENDENT_AMBULATORY_CARE_PROVIDER_SITE_OTHER): Payer: Medicare Other

## 2021-05-06 ENCOUNTER — Encounter (INDEPENDENT_AMBULATORY_CARE_PROVIDER_SITE_OTHER): Payer: Medicare Other

## 2021-05-06 VITALS — BP 114/54 | HR 73 | Ht 72.0 in | Wt 213.0 lb

## 2021-05-06 DIAGNOSIS — E785 Hyperlipidemia, unspecified: Secondary | ICD-10-CM

## 2021-05-06 DIAGNOSIS — N186 End stage renal disease: Secondary | ICD-10-CM

## 2021-05-06 DIAGNOSIS — Z95828 Presence of other vascular implants and grafts: Secondary | ICD-10-CM | POA: Diagnosis not present

## 2021-05-07 DIAGNOSIS — Z992 Dependence on renal dialysis: Secondary | ICD-10-CM | POA: Diagnosis not present

## 2021-05-07 DIAGNOSIS — D631 Anemia in chronic kidney disease: Secondary | ICD-10-CM | POA: Diagnosis not present

## 2021-05-07 DIAGNOSIS — N186 End stage renal disease: Secondary | ICD-10-CM | POA: Diagnosis not present

## 2021-05-09 DIAGNOSIS — N186 End stage renal disease: Secondary | ICD-10-CM | POA: Diagnosis not present

## 2021-05-09 DIAGNOSIS — Z992 Dependence on renal dialysis: Secondary | ICD-10-CM | POA: Diagnosis not present

## 2021-05-09 DIAGNOSIS — D631 Anemia in chronic kidney disease: Secondary | ICD-10-CM | POA: Diagnosis not present

## 2021-05-11 ENCOUNTER — Ambulatory Visit: Payer: Medicare Other | Admitting: Physician Assistant

## 2021-05-12 DIAGNOSIS — D631 Anemia in chronic kidney disease: Secondary | ICD-10-CM | POA: Diagnosis not present

## 2021-05-12 DIAGNOSIS — Z992 Dependence on renal dialysis: Secondary | ICD-10-CM | POA: Diagnosis not present

## 2021-05-12 DIAGNOSIS — N186 End stage renal disease: Secondary | ICD-10-CM | POA: Diagnosis not present

## 2021-05-14 DIAGNOSIS — N186 End stage renal disease: Secondary | ICD-10-CM | POA: Diagnosis not present

## 2021-05-14 DIAGNOSIS — D631 Anemia in chronic kidney disease: Secondary | ICD-10-CM | POA: Diagnosis not present

## 2021-05-14 DIAGNOSIS — Z992 Dependence on renal dialysis: Secondary | ICD-10-CM | POA: Diagnosis not present

## 2021-05-16 DIAGNOSIS — D631 Anemia in chronic kidney disease: Secondary | ICD-10-CM | POA: Diagnosis not present

## 2021-05-16 DIAGNOSIS — Z992 Dependence on renal dialysis: Secondary | ICD-10-CM | POA: Diagnosis not present

## 2021-05-16 DIAGNOSIS — N186 End stage renal disease: Secondary | ICD-10-CM | POA: Diagnosis not present

## 2021-05-17 ENCOUNTER — Encounter (INDEPENDENT_AMBULATORY_CARE_PROVIDER_SITE_OTHER): Payer: Self-pay | Admitting: Nurse Practitioner

## 2021-05-17 NOTE — Progress Notes (Signed)
Subjective:    Patient ID: Matthew [name], male    DOB: [birth year], 87 y.o.   MRN: [id number] Chief Complaint  Patient presents with   Follow-up    3 wk FU HDA    Is an 87 year old male that returns to the office for followup of their dialysis access.  This is the first look at his AV graft.  The patient denies redness or swelling at the access site. The patient denies fever or chills at home or while on dialysis.  The patient denies amaurosis fugax or recent TIA symptoms. There are no recent neurological changes noted. The patient denies claudication symptoms or rest pain symptoms. The patient denies history of DVT, PE or superficial thrombophlebitis. The patient denies recent episodes of angina or shortness of breath.    Today the patient has a flow volume of 1405 with no obvious area of stricture.      Review of Systems  Skin:  Positive for wound.  Hematological:  Does not bruise/bleed easily.  All other systems reviewed and are negative.     Objective:   Physical Exam Vitals reviewed.  HENT:     Head: Normocephalic.  Cardiovascular:     Rate and Rhythm: Normal rate.     Pulses:          Radial pulses are 1+ on the left side.     Arteriovenous access: Left arteriovenous access is present.    Comments: Good thrill and bruit of the left brachial ax graft Pulmonary:     Effort: Pulmonary effort is normal.  Skin:    General: Skin is warm and dry.  Neurological:     Mental Status: He is alert and oriented to person, place, and time.  Psychiatric:        Mood and Affect: Mood normal.        Behavior: Behavior normal.        Thought Content: Thought content normal.        Judgment: Judgment normal.    BP (!) 114/54    Pulse 73    Ht 6' (1.829 m)    Wt 213 lb (96.6 kg)    BMI 28.89 kg/m   Past Medical History:  Diagnosis Date   Anginal pain (HCC)    Arthritis    Ascending aortic aneurysm 07/18/2015   a.) TTE 07/18/2015: severe dilation of ascending aorta  measuring 7.0 cm. b.) TTE 01/10/2018: aortic root 3.8 cm; ascending aorta 6.8 cm; refused surgical intervention/repair.   Atrial fibrillation ([place])    a.) CHA2DS2-VASc = 6 (age x 2, HFrEF, HTN, previous MI, T2DM). b.) rate/rhythm maintained on amiodarone + metoprolol succinate; chronic antiplatelet therapy using clopidogrel   Bradycardia    Broken arm 05/2013   Left   Cardiac murmur    a.) RIGHT upper sternal border   Cardiomyopathy [facility])    Coronary artery disease    DOE (dyspnea on exertion)    ESRD (end stage renal disease) (HCC)    GERD (gastroesophageal reflux disease)    HFrEF (heart failure with reduced ejection fraction) ([place])    a.) TTE 12/03/2013: mod dec LV function; EF 35-40%; severe inferolateral and inferior HK; LA dilated; G3DD; PASP 52 mmHg. b.) TTE 07/18/2015: mild LV dysfunction with mild LVH; EF 45%; mild BAE. c.) TTE 01/10/2018: mod LV dysfunction; EF 45%; diffuse HK   HLD (hyperlipidemia)    Hypertension    IDA (iron deficiency anemia)    Long term current use of antithrombotics/antiplatelets  a.) clopidogrel   Myocardial infarction [facility]) 2000   Peripheral edema    Pneumonia    S/P CABG x 4 2001   a.) LIMA-LAD, SVG-D1, SVG-OM1, SVG-OM2, SVG-PDA   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   left frontal scalp, EDC   Squamous cell carcinoma of skin 05/05/2020   left temporal scalp, in situ, EDC 05/13/20   T2DM (type 2 diabetes mellitus) ([place])    Valvular insufficiency 12/03/2013   a.) TTE 12/03/2013: EF 35-40%; mild AR/MR, b.) TTE 07/18/2015: EF 45%; mild AR/PR, mod TR, severe MR. c.) TTE 01/10/2018: mod AR/TR    Social History   Socioeconomic History   Marital status: Widowed    Spouse name: Not on file   Number of children: Not on file   Years of education: Not on file   Highest education level: Not on file  Occupational History   Not on file  Tobacco Use   Smoking status: Former    Types: Pipe, Cigarettes    Quit date: 66    Years since  quitting: 31.0   Smokeless tobacco: Never  Vaping Use   Vaping Use: Never used  Substance and Sexual Activity   Alcohol use: Not Currently    Alcohol/week: 1.0 standard drink    Types: 1 Cans of beer per week    Comment: occasionally drinking only,once a month   Drug use: No   Sexual activity: Never  Other Topics Concern   Not on file  Social History Narrative   Lives with daughter and son in law   Social Determinants of Health   Financial Resource Strain: Not on file  Food Insecurity: Not on file  Transportation Needs: Not on file  Physical Activity: Not on file  Stress: Not on file  Social Connections: Not on file  Intimate Partner Violence: Not on file    Past Surgical History:  Procedure Laterality Date   AV FISTULA PLACEMENT Left 04/15/2021   Procedure: INSERTION OF ARTERIOVENOUS (AV) GORE-TEX GRAFT ARM ( BRACHIAL AXILLARY);  Surgeon: Algernon Huxley, MD;  Location: [facility] ORS;  Service: Vascular;  Laterality: Left;   CATARACT EXTRACTION W/PHACO Left 07/26/2017   Procedure: CATARACT EXTRACTION PHACO AND INTRAOCULAR LENS PLACEMENT ([place]);  Surgeon: Birder Robson, MD;  Location: [facility] OR[facility];  Service: Ophthalmology;  Laterality: Left;  [place]   00:45.8 AP%  13.1 CDE  6.00 Fluid Pack Lot # [id number]   CATARACT EXTRACTION W/PHACO Right 08/17/2017   Procedure: CATARACT EXTRACTION PHACO AND INTRAOCULAR LENS PLACEMENT ([facility]);  Surgeon: Birder Robson, MD;  Location: [facility] OR[facility];  Service: Ophthalmology;  Laterality: Right;  [place]  01:30 AP% 16.8 CDE 15.24 Fluid pack lot # [id number]   CHOLECYSTECTOMY     CORONARY ARTERY BYPASS GRAFT N/A 2001   Procedure: 5v CABG (LIMA-LAD, SVG-D1, SVG-OM1, SVG-OM2, SVG-PDA)   DIALYSIS/PERMA CATHETER INSERTION N/A 02/09/2018   Procedure: DIALYSIS/PERMA CATHETER INSERTION;  Surgeon: Algernon Huxley, MD;  Location: [place] [facility];  Service: Cardiovascular;  Laterality: N/A;   FRACTURE SURGERY Left 2015   LEFT arm   INSERTION OF DIALYSIS CATHETER Right  11/03/2020   Procedure: INSERTION OF Perm Cath in the [place];  Surgeon: Algernon Huxley, MD;  Location: [facility] OR[facility];  Service: Vascular;  Laterality: Right;   LEFT HEART CATHETERIZATION WITH CORONARY/GRAFT ANGIOGRAM Left 12/04/2013   Procedure: LEFT HEART CATHETERIZATION WITH Beatrix Fetters;  Surgeon: Leonie Man, MD;  Location: [facility] CATH LAB;  Service: Cardiovascular;  Laterality: Left;   LEFT HEART CATHETERIZATION  WITH CORONARY/GRAFT ANGIOGRAM Left 02/06/2009   Procedure: LEFT HEART CATHETERIZATION WITH CORONARY/GRAFT ANGIOGRAM; Location: [place]; Surgeon: Lowanda Foster, MD   PERCUTANEOUS CORONARY STENT INTERVENTION (PCI-S) N/A 12/06/2013   Procedure: STAGED PERCUTANEOUS CORONARY STENT INTERVENTION (overlapping 4.0 x 38 mm (mid) and 4.0 x 28 mm (ostial) Promus Primier DES to SVG-RPDA graft);  Surgeon: Sinclair Grooms, MD;  Location: [facility] C[facility] LAB;  Service: Cardiovascular   REMOVAL OF A DIALYSIS CATHETER N/A 11/03/2020   Procedure: REMOVAL OF A DIALYSIS CATHETER;  Surgeon: Algernon Huxley, MD;  Location: [facility] ORS;  Service: Vascular;  Laterality: N/A;    Family History  Problem Relation Age of Onset   Heart attack Father 47       died first MI at age 10   Heart failure Mother     Allergies  Allergen Reactions   Other     Other reaction(s): Myalgias (intolerance), Other (See Comments) Statin Drugs  Statin Drugs     Amoxicillin Diarrhea    CBC Latest Ref Rng & Units 04/15/2021 04/10/2021 02/17/2021  WBC 4.0 - 10.5 K/uL - 5.8 5.2  Hemoglobin 13.0 - 17.0 g/dL 10.5(L) 10.9(L) 10.4(L)  Hematocrit 39.0 - 52.0 % 31.0(L) 32.7(L) 31.7(L)  Platelets 150 - 400 K/uL - 169 159      CMP     Component Value Date/Time   NA 137 04/15/2021 0652   K 4.3 04/15/2021 0652   CL 100 04/15/2021 0652   CO2 25 04/10/2021 1434   GLUCOSE 95 04/15/2021 0652   BUN 28 (H) 04/15/2021 0652   CREATININE 2.80 (H) 04/15/2021 0652   CALCIUM 9.1 04/10/2021 1434   PROT 6.3 (L) 02/11/2021 0847    ALBUMIN 3.2 (L) 02/11/2021 0847   AST 33 02/11/2021 0847   ALT 27 02/11/2021 0847   ALKPHOS 149 (H) 02/11/2021 0847   BILITOT 1.0 02/11/2021 0847   GFRNONAA 21 (L) 04/10/2021 1434   GFRAA 25 (L) 05/26/2019 0503     No results found.     Assessment & Plan:   1. ESRD (end stage renal disease) ([place]) Recommend:  The patient is doing well and currently has adequate dialysis access.  This is the first look at his access.  We will send a letter to his dialysis center indicating that it can be used in approximately 2 weeks.  The patient should have a duplex ultrasound of the dialysis access in 6 months. The patient will follow-up with me in the office after each ultrasound     2. Hyperlipidemia, unspecified hyperlipidemia type Continue statin as ordered and reviewed, no changes at this time    Current Outpatient Medications on File Prior to Visit  Medication Sig Dispense Refill   acyclovir ointment (ZOVIRAX) 5 % Apply 1 application topically daily as needed (shingles).     amiodarone (PACERONE) 200 MG tablet Take 200 mg by mouth daily.     bisacodyl (DULCOLAX) 10 MG suppository Place 1 suppository (10 mg total) rectally daily as needed for moderate constipation. 12 suppository 0   cholecalciferol (VITAMIN D) 25 MCG (1000 UNIT) tablet Take 1,000 Units by mouth daily with breakfast.     clopidogrel (PLAVIX) 75 MG tablet Take 1 tablet (75 mg total) by mouth daily with breakfast. 30 tablet 12   ezetimibe (ZETIA) 10 MG tablet Take 10 mg by mouth daily before breakfast.     ferrous sulfate 325 (65 FE) MG tablet Take 325 mg by mouth 2 (two) times daily with a meal.  fluticasone (FLONASE) 50 MCG/ACT nasal spray Place 2 sprays into both nostrils daily as needed for allergies or rhinitis.     furosemide (LASIX) 40 MG tablet Take 40 mg by mouth.     gabapentin (NEURONTIN) 300 MG capsule Take 1 capsule (300 mg total) by mouth at bedtime.     glipiZIDE (GLUCOTROL) 5 MG tablet Take 2.5 mg by  mouth daily at 12 noon.     HYDROcodone-acetaminophen (NORCO/VICODIN) 5-325 MG tablet Take 2 tablets by mouth every 6 (six) hours as needed for moderate pain. 20 tablet 0   levocetirizine (XYZAL) 5 MG tablet Take 10 mg by mouth every morning.      levothyroxine (SYNTHROID) 75 MCG tablet Take 1 tablet (75 mcg total) by mouth daily before breakfast.     metoprolol succinate (TOPROL-XL) 25 MG 24 hr tablet Take 0.5 tablets (12.5 mg total) by mouth at bedtime. 30 tablet 0   midodrine (PROAMATINE) 5 MG tablet Take 1 tablet (5 mg total) by mouth 3 (three) times daily with meals. (Patient taking differently: Take 5 mg by mouth 3 (three) times daily as needed.)     multivitamin (RENA-VIT) TABS tablet Take 1 tablet by mouth daily.     nitroGLYCERIN (NITROSTAT) 0.4 MG SL tablet Place 0.4 mg under the tongue every 5 (five) minutes as needed for chest pain.     Omega-3 Fatty Acids (FISH OIL) 1000 MG CAPS Take 4,000 mg by mouth daily with lunch.     polyethylene glycol (MIRALAX / GLYCOLAX) 17 g packet Take 17 g by mouth daily. 14 each 0   potassium chloride SA (KLOR-CON) 20 MEQ tablet Take 20 mEq by mouth daily.     rosuvastatin (CRESTOR) 40 MG tablet Take 40 mg by mouth at bedtime.     senna-docusate (SENOKOT-S) 8.6-50 MG tablet Take 2 tablets by mouth 2 (two) times daily. 30 tablet 0   traMADol (ULTRAM) 50 MG tablet Take 1 tablet (50 mg total) by mouth every 6 (six) hours as needed for moderate pain. 10 tablet 0   No current facility-administered medications on file prior to visit.    There are no Patient Instructions on file for this visit. No follow-ups on file.   Kris Hartmann, NP

## 2021-05-19 DIAGNOSIS — N186 End stage renal disease: Secondary | ICD-10-CM | POA: Diagnosis not present

## 2021-05-19 DIAGNOSIS — D631 Anemia in chronic kidney disease: Secondary | ICD-10-CM | POA: Diagnosis not present

## 2021-05-19 DIAGNOSIS — Z992 Dependence on renal dialysis: Secondary | ICD-10-CM | POA: Diagnosis not present

## 2021-05-21 DIAGNOSIS — Z992 Dependence on renal dialysis: Secondary | ICD-10-CM | POA: Diagnosis not present

## 2021-05-21 DIAGNOSIS — N186 End stage renal disease: Secondary | ICD-10-CM | POA: Diagnosis not present

## 2021-05-21 DIAGNOSIS — D631 Anemia in chronic kidney disease: Secondary | ICD-10-CM | POA: Diagnosis not present

## 2021-05-23 DIAGNOSIS — N186 End stage renal disease: Secondary | ICD-10-CM | POA: Diagnosis not present

## 2021-05-23 DIAGNOSIS — D631 Anemia in chronic kidney disease: Secondary | ICD-10-CM | POA: Diagnosis not present

## 2021-05-23 DIAGNOSIS — Z992 Dependence on renal dialysis: Secondary | ICD-10-CM | POA: Diagnosis not present

## 2021-05-26 DIAGNOSIS — Z992 Dependence on renal dialysis: Secondary | ICD-10-CM | POA: Diagnosis not present

## 2021-05-26 DIAGNOSIS — N2581 Secondary hyperparathyroidism of renal origin: Secondary | ICD-10-CM | POA: Diagnosis not present

## 2021-05-26 DIAGNOSIS — N186 End stage renal disease: Secondary | ICD-10-CM | POA: Diagnosis not present

## 2021-05-26 DIAGNOSIS — D631 Anemia in chronic kidney disease: Secondary | ICD-10-CM | POA: Diagnosis not present

## 2021-05-28 DIAGNOSIS — N2581 Secondary hyperparathyroidism of renal origin: Secondary | ICD-10-CM | POA: Diagnosis not present

## 2021-05-28 DIAGNOSIS — Z992 Dependence on renal dialysis: Secondary | ICD-10-CM | POA: Diagnosis not present

## 2021-05-28 DIAGNOSIS — D631 Anemia in chronic kidney disease: Secondary | ICD-10-CM | POA: Diagnosis not present

## 2021-05-28 DIAGNOSIS — N186 End stage renal disease: Secondary | ICD-10-CM | POA: Diagnosis not present

## 2021-05-30 DIAGNOSIS — D631 Anemia in chronic kidney disease: Secondary | ICD-10-CM | POA: Diagnosis not present

## 2021-05-30 DIAGNOSIS — N2581 Secondary hyperparathyroidism of renal origin: Secondary | ICD-10-CM | POA: Diagnosis not present

## 2021-05-30 DIAGNOSIS — Z992 Dependence on renal dialysis: Secondary | ICD-10-CM | POA: Diagnosis not present

## 2021-05-30 DIAGNOSIS — N186 End stage renal disease: Secondary | ICD-10-CM | POA: Diagnosis not present

## 2021-06-01 DIAGNOSIS — N186 End stage renal disease: Secondary | ICD-10-CM | POA: Diagnosis not present

## 2021-06-01 DIAGNOSIS — D631 Anemia in chronic kidney disease: Secondary | ICD-10-CM | POA: Diagnosis not present

## 2021-06-01 DIAGNOSIS — Z992 Dependence on renal dialysis: Secondary | ICD-10-CM | POA: Diagnosis not present

## 2021-06-01 DIAGNOSIS — N2581 Secondary hyperparathyroidism of renal origin: Secondary | ICD-10-CM | POA: Diagnosis not present

## 2021-06-03 DIAGNOSIS — N2581 Secondary hyperparathyroidism of renal origin: Secondary | ICD-10-CM | POA: Diagnosis not present

## 2021-06-03 DIAGNOSIS — D631 Anemia in chronic kidney disease: Secondary | ICD-10-CM | POA: Diagnosis not present

## 2021-06-03 DIAGNOSIS — Z992 Dependence on renal dialysis: Secondary | ICD-10-CM | POA: Diagnosis not present

## 2021-06-03 DIAGNOSIS — N186 End stage renal disease: Secondary | ICD-10-CM | POA: Diagnosis not present

## 2021-06-05 DIAGNOSIS — N2581 Secondary hyperparathyroidism of renal origin: Secondary | ICD-10-CM | POA: Diagnosis not present

## 2021-06-05 DIAGNOSIS — D631 Anemia in chronic kidney disease: Secondary | ICD-10-CM | POA: Diagnosis not present

## 2021-06-05 DIAGNOSIS — N186 End stage renal disease: Secondary | ICD-10-CM | POA: Diagnosis not present

## 2021-06-05 DIAGNOSIS — Z992 Dependence on renal dialysis: Secondary | ICD-10-CM | POA: Diagnosis not present

## 2021-06-08 DIAGNOSIS — N2581 Secondary hyperparathyroidism of renal origin: Secondary | ICD-10-CM | POA: Diagnosis not present

## 2021-06-08 DIAGNOSIS — D631 Anemia in chronic kidney disease: Secondary | ICD-10-CM | POA: Diagnosis not present

## 2021-06-08 DIAGNOSIS — Z992 Dependence on renal dialysis: Secondary | ICD-10-CM | POA: Diagnosis not present

## 2021-06-08 DIAGNOSIS — N186 End stage renal disease: Secondary | ICD-10-CM | POA: Diagnosis not present

## 2021-06-10 DIAGNOSIS — N2581 Secondary hyperparathyroidism of renal origin: Secondary | ICD-10-CM | POA: Diagnosis not present

## 2021-06-10 DIAGNOSIS — N186 End stage renal disease: Secondary | ICD-10-CM | POA: Diagnosis not present

## 2021-06-10 DIAGNOSIS — D631 Anemia in chronic kidney disease: Secondary | ICD-10-CM | POA: Diagnosis not present

## 2021-06-10 DIAGNOSIS — Z992 Dependence on renal dialysis: Secondary | ICD-10-CM | POA: Diagnosis not present

## 2021-06-12 DIAGNOSIS — Z992 Dependence on renal dialysis: Secondary | ICD-10-CM | POA: Diagnosis not present

## 2021-06-12 DIAGNOSIS — D631 Anemia in chronic kidney disease: Secondary | ICD-10-CM | POA: Diagnosis not present

## 2021-06-12 DIAGNOSIS — N186 End stage renal disease: Secondary | ICD-10-CM | POA: Diagnosis not present

## 2021-06-12 DIAGNOSIS — N2581 Secondary hyperparathyroidism of renal origin: Secondary | ICD-10-CM | POA: Diagnosis not present

## 2021-06-15 DIAGNOSIS — N2581 Secondary hyperparathyroidism of renal origin: Secondary | ICD-10-CM | POA: Diagnosis not present

## 2021-06-15 DIAGNOSIS — N186 End stage renal disease: Secondary | ICD-10-CM | POA: Diagnosis not present

## 2021-06-15 DIAGNOSIS — Z992 Dependence on renal dialysis: Secondary | ICD-10-CM | POA: Diagnosis not present

## 2021-06-15 DIAGNOSIS — D631 Anemia in chronic kidney disease: Secondary | ICD-10-CM | POA: Diagnosis not present

## 2021-06-17 ENCOUNTER — Telehealth (INDEPENDENT_AMBULATORY_CARE_PROVIDER_SITE_OTHER): Payer: Self-pay

## 2021-06-17 DIAGNOSIS — D631 Anemia in chronic kidney disease: Secondary | ICD-10-CM | POA: Diagnosis not present

## 2021-06-17 DIAGNOSIS — Z992 Dependence on renal dialysis: Secondary | ICD-10-CM | POA: Diagnosis not present

## 2021-06-17 DIAGNOSIS — N2581 Secondary hyperparathyroidism of renal origin: Secondary | ICD-10-CM | POA: Diagnosis not present

## 2021-06-17 DIAGNOSIS — N186 End stage renal disease: Secondary | ICD-10-CM | POA: Diagnosis not present

## 2021-06-17 NOTE — Telephone Encounter (Signed)
Spoke with the patient's son in law and he is scheduled for a permcath removal with Dr. Lucky Cowboy on 06/25/21 with a 11:45 am arrival time to the [facility]. Pre-procedure instructions were discussed and will be mailed.

## 2021-06-19 DIAGNOSIS — D631 Anemia in chronic kidney disease: Secondary | ICD-10-CM | POA: Diagnosis not present

## 2021-06-19 DIAGNOSIS — N186 End stage renal disease: Secondary | ICD-10-CM | POA: Diagnosis not present

## 2021-06-19 DIAGNOSIS — N2581 Secondary hyperparathyroidism of renal origin: Secondary | ICD-10-CM | POA: Diagnosis not present

## 2021-06-19 DIAGNOSIS — Z992 Dependence on renal dialysis: Secondary | ICD-10-CM | POA: Diagnosis not present

## 2021-06-22 DIAGNOSIS — D631 Anemia in chronic kidney disease: Secondary | ICD-10-CM | POA: Diagnosis not present

## 2021-06-22 DIAGNOSIS — N2581 Secondary hyperparathyroidism of renal origin: Secondary | ICD-10-CM | POA: Diagnosis not present

## 2021-06-22 DIAGNOSIS — N186 End stage renal disease: Secondary | ICD-10-CM | POA: Diagnosis not present

## 2021-06-22 DIAGNOSIS — Z992 Dependence on renal dialysis: Secondary | ICD-10-CM | POA: Diagnosis not present

## 2021-06-23 DIAGNOSIS — R0602 Shortness of breath: Secondary | ICD-10-CM | POA: Diagnosis not present

## 2021-06-23 DIAGNOSIS — Z992 Dependence on renal dialysis: Secondary | ICD-10-CM | POA: Diagnosis not present

## 2021-06-23 DIAGNOSIS — I509 Heart failure, unspecified: Secondary | ICD-10-CM | POA: Diagnosis not present

## 2021-06-23 DIAGNOSIS — E785 Hyperlipidemia, unspecified: Secondary | ICD-10-CM | POA: Diagnosis not present

## 2021-06-23 DIAGNOSIS — N186 End stage renal disease: Secondary | ICD-10-CM | POA: Diagnosis not present

## 2021-06-23 DIAGNOSIS — I340 Nonrheumatic mitral (valve) insufficiency: Secondary | ICD-10-CM | POA: Diagnosis not present

## 2021-06-23 DIAGNOSIS — I25810 Atherosclerosis of coronary artery bypass graft(s) without angina pectoris: Secondary | ICD-10-CM | POA: Diagnosis not present

## 2021-06-23 DIAGNOSIS — I2510 Atherosclerotic heart disease of native coronary artery without angina pectoris: Secondary | ICD-10-CM | POA: Diagnosis not present

## 2021-06-23 DIAGNOSIS — I10 Essential (primary) hypertension: Secondary | ICD-10-CM | POA: Diagnosis not present

## 2021-06-23 DIAGNOSIS — I4891 Unspecified atrial fibrillation: Secondary | ICD-10-CM | POA: Diagnosis not present

## 2021-06-24 DIAGNOSIS — D631 Anemia in chronic kidney disease: Secondary | ICD-10-CM | POA: Diagnosis not present

## 2021-06-24 DIAGNOSIS — N186 End stage renal disease: Secondary | ICD-10-CM | POA: Diagnosis not present

## 2021-06-24 DIAGNOSIS — Z992 Dependence on renal dialysis: Secondary | ICD-10-CM | POA: Diagnosis not present

## 2021-06-24 DIAGNOSIS — Z23 Encounter for immunization: Secondary | ICD-10-CM | POA: Diagnosis not present

## 2021-06-25 ENCOUNTER — Other Ambulatory Visit (INDEPENDENT_AMBULATORY_CARE_PROVIDER_SITE_OTHER): Payer: Self-pay | Admitting: Nurse Practitioner

## 2021-06-25 ENCOUNTER — Ambulatory Visit
Admission: RE | Admit: 2021-06-25 | Discharge: 2021-06-25 | Disposition: A | Discharge status: Home / Self Care | Payer: Medicare Other | Attending: Vascular Surgery | Admitting: Vascular Surgery

## 2021-06-25 ENCOUNTER — Encounter
Admission: RE | Disposition: A | Discharge status: Home / Self Care | Payer: Self-pay | Source: Home / Self Care | Attending: Vascular Surgery

## 2021-06-25 ENCOUNTER — Encounter: Payer: Self-pay | Admitting: Vascular Surgery

## 2021-06-25 DIAGNOSIS — Z87891 Personal history of nicotine dependence: Secondary | ICD-10-CM | POA: Insufficient documentation

## 2021-06-25 DIAGNOSIS — I5022 Chronic systolic (congestive) heart failure: Secondary | ICD-10-CM | POA: Insufficient documentation

## 2021-06-25 DIAGNOSIS — I2510 Atherosclerotic heart disease of native coronary artery without angina pectoris: Secondary | ICD-10-CM | POA: Diagnosis not present

## 2021-06-25 DIAGNOSIS — N186 End stage renal disease: Secondary | ICD-10-CM | POA: Insufficient documentation

## 2021-06-25 DIAGNOSIS — I132 Hypertensive heart and chronic kidney disease with heart failure and with stage 5 chronic kidney disease, or end stage renal disease: Secondary | ICD-10-CM | POA: Insufficient documentation

## 2021-06-25 DIAGNOSIS — E1122 Type 2 diabetes mellitus with diabetic chronic kidney disease: Secondary | ICD-10-CM | POA: Diagnosis not present

## 2021-06-25 DIAGNOSIS — Z4901 Encounter for fitting and adjustment of extracorporeal dialysis catheter: Secondary | ICD-10-CM | POA: Insufficient documentation

## 2021-06-25 DIAGNOSIS — Z992 Dependence on renal dialysis: Secondary | ICD-10-CM | POA: Diagnosis not present

## 2021-06-25 HISTORY — PX: DIALYSIS/PERMA CATHETER REMOVAL: CATH118289

## 2021-06-25 SURGERY — DIALYSIS/PERMA CATHETER REMOVAL
Anesthesia: LOCAL

## 2021-06-25 SURGICAL SUPPLY — 1 items: TRAY LACERAT/PLASTIC (MISCELLANEOUS) ×1 IMPLANT

## 2021-06-25 NOTE — Discharge Instructions (Signed)
Tunneled Catheter Removal, Care After Refer to this sheet in the next few weeks. These instructions provide you with information about caring for yourself after your procedure. Your health care provider may also give you more specific instructions. Your treatment has been planned according to current medical practices, but problems sometimes occur. Call your health care provider if you have any problems or questions after your procedure. What can I expect after the procedure? After the procedure, it is common to have: Some mild redness, swelling, and pain around your catheter site.   Follow these instructions at home: Incision care  Check your removal site  every day for signs of infection. Check for: More redness, swelling, or pain. More fluid or blood. Warmth. Pus or a bad smell. Remove your dressing in 48hrs leave open to air  Activity  Return to your normal activities as told by your health care provider. Ask your health care provider what activities are safe for you. Do not lift anything that is heavier than 10 lb (4.5 kg) for 3 days  You may shower tomorrow  Contact a health care provider if: You have more fluid or blood coming from your removal site You have more redness, swelling, or pain at your incisions or around the area where your catheter was removed Your removal site feel warm to the touch. You feel unusually weak. You feel nauseous.. Get help right away if You have swelling in your arm, shoulder, neck, or face. You develop chest pain. You have difficulty breathing. You feel dizzy or light-headed. You have pus or a bad smell coming from your removal site You have a fever. You develop bleeding from your removal site, and your bleeding does not stop. This information is not intended to replace advice given to you by your health care provider. Make sure you discuss any questions you have with your health care provider. Document Released: 04/26/2012 Document Revised:  01/11/2016 Document Reviewed: 02/03/2015 Elsevier Interactive Patient Education  2017 [employer] 

## 2021-06-25 NOTE — H&P (Signed)
[facility] Admission History & Physical  MRN : [id number]  Matthew [name] is a 88 y.o. (02-03-1931) male who presents with chief complaint of No chief complaint on file. Marland Kitchen  History of Present Illness: I am asked to evaluate the patient by the dialysis center. The patient was sent here because they have a nonfunctioning tunneled catheter and a functioning arm access.  The patient reports they're not been any problems with any of their dialysis runs. They are reporting good flows with good parameters at dialysis.   Patient denies pain or tenderness overlying the access.  There is no pain with dialysis.  The patient denies hand pain or finger pain consistent with steal syndrome.  No fevers or chills while on dialysis.    No current facility-administered medications for this encounter.    Past Medical History:  Diagnosis Date   Anginal pain ([place])    Arthritis    Ascending aortic aneurysm 07/18/2015   a.) TTE 07/18/2015: severe dilation of ascending aorta measuring 7.0 cm. b.) TTE 01/10/2018: aortic root 3.8 cm; ascending aorta 6.8 cm; refused surgical intervention/repair.   Atrial fibrillation ([place])    a.) CHA2DS2-VASc = 6 (age x 2, HFrEF, HTN, previous MI, T2DM). b.) rate/rhythm maintained on amiodarone + metoprolol succinate; chronic antiplatelet therapy using clopidogrel   Bradycardia    Broken arm 05/2013   Left   Cardiac murmur    a.) RIGHT upper sternal border   Cardiomyopathy [facility])    Coronary artery disease    DOE (dyspnea on exertion)    ESRD (end stage renal disease) (HCC)    GERD (gastroesophageal reflux disease)    HFrEF (heart failure with reduced ejection fraction) ([place])    a.) TTE 12/03/2013: mod dec LV function; EF 35-40%; severe inferolateral and inferior HK; LA dilated; G3DD; PASP 52 mmHg. b.) TTE 07/18/2015: mild LV dysfunction with mild LVH; EF 45%; mild BAE. c.) TTE 01/10/2018: mod LV dysfunction; EF 45%; diffuse HK   HLD (hyperlipidemia)     Hypertension    IDA (iron deficiency anemia)    Long term current use of antithrombotics/antiplatelets    a.) clopidogrel   Myocardial infarction ([place]) 2000   Peripheral edema    Pneumonia    S/P CABG x 4 2001   a.) LIMA-LAD, SVG-D1, SVG-OM1, SVG-OM2, SVG-PDA   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   left frontal scalp, EDC   Squamous cell carcinoma of skin 05/05/2020   left temporal scalp, in situ, EDC 05/13/20   T2DM (type 2 diabetes mellitus) ([place])    Valvular insufficiency 12/03/2013   a.) TTE 12/03/2013: EF 35-40%; mild AR/MR, b.) TTE 07/18/2015: EF 45%; mild AR/PR, mod TR, severe MR. c.) TTE 01/10/2018: mod AR/TR    Past Surgical History:  Procedure Laterality Date   AV FISTULA PLACEMENT Left 04/15/2021   Procedure: INSERTION OF ARTERIOVENOUS (AV) GORE-TEX GRAFT ARM ( BRACHIAL AXILLARY);  Surgeon: Algernon Huxley, MD;  Location: [facility] ORS;  Service: Vascular;  Laterality: Left;   CATARACT EXTRACTION W/PHACO Left 07/26/2017   Procedure: CATARACT EXTRACTION PHACO AND INTRAOCULAR LENS PLACEMENT ([place]);  Surgeon: Birder Robson, MD;  Location: [facility] ORS;  Service: Ophthalmology;  Laterality: Left;  [place]   00:45.8 AP%  13.1 CDE  6.00 Fluid Pack Lot # [id number]   CATARACT EXTRACTION W/PHACO Right 08/17/2017   Procedure: CATARACT EXTRACTION PHACO AND INTRAOCULAR LENS PLACEMENT (IOC);  Surgeon: Birder Robson, MD;  Location: [facility] ORS;  Service: Ophthalmology;  Laterality: Right;  [place]  01:30 AP% 16.8 CDE 15.24 Fluid pack lot # [id number]   CHOLECYSTECTOMY     CORONARY ARTERY BYPASS GRAFT N/A 2001   Procedure: 5v CABG (LIMA-LAD, SVG-D1, SVG-OM1, SVG-OM2, SVG-PDA)   DIALYSIS/PERMA CATHETER INSERTION N/A 02/09/2018   Procedure: DIALYSIS/PERMA CATHETER INSERTION;  Surgeon: Algernon Huxley, MD;  Location: [place] [facility];  Service: Cardiovascular;  Laterality: N/A;   FRACTURE SURGERY Left 2015   LEFT arm   INSERTION OF DIALYSIS CATHETER Right 11/03/2020   Procedure: INSERTION OF  Perm Cath in the [place];  Surgeon: Algernon Huxley, MD;  Location: [facility] ORS;  Service: Vascular;  Laterality: Right;   LEFT HEART CATHETERIZATION WITH CORONARY/GRAFT ANGIOGRAM Left 12/04/2013   Procedure: LEFT HEART CATHETERIZATION WITH Beatrix Fetters;  Surgeon: Leonie Man, MD;  Location: [facility] CATH LAB;  Service: Cardiovascular;  Laterality: Left;   LEFT HEART CATHETERIZATION WITH CORONARY/GRAFT ANGIOGRAM Left 02/06/2009   Procedure: LEFT HEART CATHETERIZATION WITH CORONARY/GRAFT ANGIOGRAM; Location: [place]; Surgeon: Lowanda Foster, MD   PERCUTANEOUS CORONARY STENT INTERVENTION (PCI-S) N/A 12/06/2013   Procedure: STAGED PERCUTANEOUS CORONARY STENT INTERVENTION (overlapping 4.0 x 38 mm (mid) and 4.0 x 28 mm (ostial) Promus Primier DES to SVG-RPDA graft);  Surgeon: Sinclair Grooms, MD;  Location[facility] LAB;  Service: Cardiovascular   REMOVAL OF A DIALYSIS CATHETER N/A 11/03/2020   Procedure: REMOVAL OF A DIALYSIS CATHETER;  Surgeon: Algernon Huxley, MD;  Location: [facility] ORS;  Service: Vascular;  Laterality: N/A;    Social History   Tobacco Use   Smoking status: Former    Types: Pipe, Cigarettes    Quit date: 1992    Years since quitting: 31.1   Smokeless tobacco: Never  Vaping Use   Vaping Use: Never used  Substance Use Topics   Alcohol use: Not Currently    Alcohol/week: 1.0 standard drink    Types: 1 Cans of beer per week    Comment: occasionally drinking only,once a month   Drug use: No     Family History  Problem Relation Age of Onset   Heart attack Father 58       died first MI at age 50   Heart failure Mother     No family history of bleeding or clotting disorders, autoimmune disease or porphyria  Allergies  Allergen Reactions   Other     Other reaction(s): Myalgias (intolerance), Other (See Comments) Statin Drugs  Statin Drugs     Amoxicillin Diarrhea     REVIEW OF SYSTEMS (Negative unless checked)  Constitutional: [] Weight loss  [] Fever   [] Chills Cardiac: [] Chest pain   [] Chest pressure   [] Palpitations   [] Shortness of breath when laying flat   [] Shortness of breath at rest   [x] Shortness of breath with exertion. Vascular:  [] Pain in legs with walking   [] Pain in legs at rest   [] Pain in legs when laying flat   [] Claudication   [] Pain in feet when walking  [] Pain in feet at rest  [] Pain in feet when laying flat   [] History of DVT   [] Phlebitis   [] Swelling in legs   [] Varicose veins   [] Non-healing ulcers Pulmonary:   [] Uses home oxygen   [] Productive cough   [] Hemoptysis   [] Wheeze  [] COPD   [] Asthma Neurologic:  [] Dizziness  [] Blackouts   [] Seizures   [] History of stroke   [] History of TIA  [] Aphasia   [] Temporary blindness   [] Dysphagia   [] Weakness or numbness in arms   [] Weakness or  numbness in legs Musculoskeletal:  [] Arthritis   [] Joint swelling   [] Joint pain   [] Low back pain Hematologic:  [] Easy bruising  [] Easy bleeding   [] Hypercoagulable state   [] Anemic  [] Hepatitis Gastrointestinal:  [] Blood in stool   [] Vomiting blood  [] Gastroesophageal reflux/heartburn   [] Difficulty swallowing. Genitourinary:  [x] Chronic kidney disease   [] Difficult urination  [] Frequent urination  [] Burning with urination   [] Blood in urine Skin:  [] Rashes   [] Ulcers   [] Wounds Psychological:  [] History of anxiety   []  History of major depression.  Physical Examination  There were no vitals filed for this visit. There is no height or weight on file to calculate BMI. Gen: WD/WN, NAD Head: [place]/AT, No temporalis wasting.  Ear/Nose/Throat: Hearing grossly intact, nares w/o erythema or drainage, oropharynx w/o Erythema/Exudate,  Eyes: Conjunctiva clear, sclera non-icteric Neck: Trachea midline.  No JVD.  Pulmonary:  Good air movement, respirations not labored, no use of accessory muscles.  Cardiac: RRR, normal S1, S2. Vascular: thrill in AVG Vessel Right Left  Radial Palpable Palpable   Musculoskeletal: M/S 5/5 throughout.  Extremities  without ischemic changes.  No deformity or atrophy.  Neurologic: Sensation grossly intact in extremities.  Symmetrical.  Speech is fluent. Motor exam as listed above. Psychiatric: Judgment intact, Mood & affect appropriate for pt's clinical situation. Dermatologic: No rashes or ulcers noted.  No cellulitis or open wounds.    CBC Lab Results  Component Value Date   WBC 5.8 04/10/2021   HGB 10.5 (L) 04/15/2021   HCT 31.0 (L) 04/15/2021   MCV 94.0 04/10/2021   PLT 169 04/10/2021    BMET    Component Value Date/Time   NA 137 04/15/2021 0652   K 4.3 04/15/2021 0652   CL 100 04/15/2021 0652   CO2 25 04/10/2021 1434   GLUCOSE 95 04/15/2021 0652   BUN 28 (H) 04/15/2021 0652   CREATININE 2.80 (H) 04/15/2021 0652   CALCIUM 9.1 04/10/2021 1434   GFRNONAA 21 (L) 04/10/2021 1434   GFRAA 25 (L) 05/26/2019 0503   CrCl cannot be calculated (Patient's most recent lab result is older than the maximum 21 days allowed.).  COAG Lab Results  Component Value Date   INR 1.1 02/11/2021   INR 1.0 05/24/2019   INR 1.0 05/24/2019    Radiology No results found.  Assessment/Plan 1.  Complication dialysis device with thrombosis AV access:  Patient's Tunneled catheter is malfunctioning. The patient has an extremity access that is functioning well. Therefore, the patient will undergo removal of the tunneled catheter under local anesthesia.  The risks and benefits were described to the patient.  All questions were answered.  The patient agrees to proceed with angiography and intervention. Potassium will be drawn to ensure that it is an appropriate level prior to performing intervention. 2.  End-stage renal disease requiring hemodialysis:  Patient will continue dialysis therapy without further interruption if a successful intervention is not achieved then a tunneled catheter will be placed. Dialysis has already been arranged. 3.  Hypertension:  Patient will continue medical management; nephrology is  following no changes in oral medications. 4. Diabetes mellitus:  Glucose will be monitored and oral medications been held this morning once the patient has undergone the patient's procedure po intake will be reinitiated and again Accu-Cheks will be used to assess the blood glucose level and treat as needed. The patient will be restarted on the patient's usual hypoglycemic regime 5.  Coronary artery disease:  EKG will be monitored. Nitrates will be  used if needed. The patient's oral cardiac medications will be continued.    [name], MD  06/25/2021 12:03 PM

## 2021-06-25 NOTE — Op Note (Signed)
Operative Note     Preoperative diagnosis:   1. ESRD with functional permanent access  Postoperative diagnosis:  1. ESRD with functional permanent access  Procedure:  Removal of right jugular Permcath  Surgeon:  [name], MD  Anesthesia:  Local  EBL:  Minimal  Indication for the Procedure:  The patient has a functional permanent dialysis access and no longer needs their permcath.  This can be removed.  Risks and benefits are discussed and informed consent is obtained.  Description of the Procedure:  The patient's right neck, chest and existing catheter were sterilely prepped and draped. The area around the catheter was anesthetized copiously with 1% lidocaine. The catheter was dissected out with curved hemostats until the cuff was freed from the surrounding fibrous sheath. The fiber sheath was transected, and the catheter was then removed in its entirety using gentle traction. Pressure was held and sterile dressings were placed. The patient tolerated the procedure well and was taken to the recovery room in stable condition.     [name]  06/25/2021, 1:06 PM This note was created with Dragon Medical transcription system. Any errors in dictation are purely unintentional.

## 2021-06-26 DIAGNOSIS — Z23 Encounter for immunization: Secondary | ICD-10-CM | POA: Diagnosis not present

## 2021-06-26 DIAGNOSIS — D631 Anemia in chronic kidney disease: Secondary | ICD-10-CM | POA: Diagnosis not present

## 2021-06-26 DIAGNOSIS — N186 End stage renal disease: Secondary | ICD-10-CM | POA: Diagnosis not present

## 2021-06-26 DIAGNOSIS — Z992 Dependence on renal dialysis: Secondary | ICD-10-CM | POA: Diagnosis not present

## 2021-06-29 DIAGNOSIS — Z23 Encounter for immunization: Secondary | ICD-10-CM | POA: Diagnosis not present

## 2021-06-29 DIAGNOSIS — Z992 Dependence on renal dialysis: Secondary | ICD-10-CM | POA: Diagnosis not present

## 2021-06-29 DIAGNOSIS — D631 Anemia in chronic kidney disease: Secondary | ICD-10-CM | POA: Diagnosis not present

## 2021-06-29 DIAGNOSIS — N186 End stage renal disease: Secondary | ICD-10-CM | POA: Diagnosis not present

## 2021-07-01 DIAGNOSIS — D631 Anemia in chronic kidney disease: Secondary | ICD-10-CM | POA: Diagnosis not present

## 2021-07-01 DIAGNOSIS — Z23 Encounter for immunization: Secondary | ICD-10-CM | POA: Diagnosis not present

## 2021-07-01 DIAGNOSIS — Z992 Dependence on renal dialysis: Secondary | ICD-10-CM | POA: Diagnosis not present

## 2021-07-01 DIAGNOSIS — N186 End stage renal disease: Secondary | ICD-10-CM | POA: Diagnosis not present

## 2021-07-03 DIAGNOSIS — Z23 Encounter for immunization: Secondary | ICD-10-CM | POA: Diagnosis not present

## 2021-07-03 DIAGNOSIS — D631 Anemia in chronic kidney disease: Secondary | ICD-10-CM | POA: Diagnosis not present

## 2021-07-03 DIAGNOSIS — Z992 Dependence on renal dialysis: Secondary | ICD-10-CM | POA: Diagnosis not present

## 2021-07-03 DIAGNOSIS — N186 End stage renal disease: Secondary | ICD-10-CM | POA: Diagnosis not present

## 2021-07-06 DIAGNOSIS — Z992 Dependence on renal dialysis: Secondary | ICD-10-CM | POA: Diagnosis not present

## 2021-07-06 DIAGNOSIS — D631 Anemia in chronic kidney disease: Secondary | ICD-10-CM | POA: Diagnosis not present

## 2021-07-06 DIAGNOSIS — Z23 Encounter for immunization: Secondary | ICD-10-CM | POA: Diagnosis not present

## 2021-07-06 DIAGNOSIS — N186 End stage renal disease: Secondary | ICD-10-CM | POA: Diagnosis not present

## 2021-07-08 DIAGNOSIS — Z992 Dependence on renal dialysis: Secondary | ICD-10-CM | POA: Diagnosis not present

## 2021-07-08 DIAGNOSIS — Z23 Encounter for immunization: Secondary | ICD-10-CM | POA: Diagnosis not present

## 2021-07-08 DIAGNOSIS — N186 End stage renal disease: Secondary | ICD-10-CM | POA: Diagnosis not present

## 2021-07-08 DIAGNOSIS — D631 Anemia in chronic kidney disease: Secondary | ICD-10-CM | POA: Diagnosis not present

## 2021-07-10 DIAGNOSIS — Z992 Dependence on renal dialysis: Secondary | ICD-10-CM | POA: Diagnosis not present

## 2021-07-10 DIAGNOSIS — N186 End stage renal disease: Secondary | ICD-10-CM | POA: Diagnosis not present

## 2021-07-10 DIAGNOSIS — D631 Anemia in chronic kidney disease: Secondary | ICD-10-CM | POA: Diagnosis not present

## 2021-07-10 DIAGNOSIS — Z23 Encounter for immunization: Secondary | ICD-10-CM | POA: Diagnosis not present

## 2021-07-13 DIAGNOSIS — D631 Anemia in chronic kidney disease: Secondary | ICD-10-CM | POA: Diagnosis not present

## 2021-07-13 DIAGNOSIS — Z23 Encounter for immunization: Secondary | ICD-10-CM | POA: Diagnosis not present

## 2021-07-13 DIAGNOSIS — N186 End stage renal disease: Secondary | ICD-10-CM | POA: Diagnosis not present

## 2021-07-13 DIAGNOSIS — Z992 Dependence on renal dialysis: Secondary | ICD-10-CM | POA: Diagnosis not present

## 2021-07-15 DIAGNOSIS — Z992 Dependence on renal dialysis: Secondary | ICD-10-CM | POA: Diagnosis not present

## 2021-07-15 DIAGNOSIS — N186 End stage renal disease: Secondary | ICD-10-CM | POA: Diagnosis not present

## 2021-07-15 DIAGNOSIS — Z23 Encounter for immunization: Secondary | ICD-10-CM | POA: Diagnosis not present

## 2021-07-15 DIAGNOSIS — D631 Anemia in chronic kidney disease: Secondary | ICD-10-CM | POA: Diagnosis not present

## 2021-07-17 DIAGNOSIS — N186 End stage renal disease: Secondary | ICD-10-CM | POA: Diagnosis not present

## 2021-07-17 DIAGNOSIS — D631 Anemia in chronic kidney disease: Secondary | ICD-10-CM | POA: Diagnosis not present

## 2021-07-17 DIAGNOSIS — Z23 Encounter for immunization: Secondary | ICD-10-CM | POA: Diagnosis not present

## 2021-07-17 DIAGNOSIS — Z992 Dependence on renal dialysis: Secondary | ICD-10-CM | POA: Diagnosis not present

## 2021-07-20 DIAGNOSIS — Z23 Encounter for immunization: Secondary | ICD-10-CM | POA: Diagnosis not present

## 2021-07-20 DIAGNOSIS — D631 Anemia in chronic kidney disease: Secondary | ICD-10-CM | POA: Diagnosis not present

## 2021-07-20 DIAGNOSIS — Z992 Dependence on renal dialysis: Secondary | ICD-10-CM | POA: Diagnosis not present

## 2021-07-20 DIAGNOSIS — N186 End stage renal disease: Secondary | ICD-10-CM | POA: Diagnosis not present

## 2021-07-21 DIAGNOSIS — N186 End stage renal disease: Secondary | ICD-10-CM | POA: Diagnosis not present

## 2021-07-21 DIAGNOSIS — Z992 Dependence on renal dialysis: Secondary | ICD-10-CM | POA: Diagnosis not present

## 2021-07-22 DIAGNOSIS — N186 End stage renal disease: Secondary | ICD-10-CM | POA: Diagnosis not present

## 2021-07-22 DIAGNOSIS — D631 Anemia in chronic kidney disease: Secondary | ICD-10-CM | POA: Diagnosis not present

## 2021-07-22 DIAGNOSIS — Z992 Dependence on renal dialysis: Secondary | ICD-10-CM | POA: Diagnosis not present

## 2021-07-24 DIAGNOSIS — D631 Anemia in chronic kidney disease: Secondary | ICD-10-CM | POA: Diagnosis not present

## 2021-07-24 DIAGNOSIS — Z992 Dependence on renal dialysis: Secondary | ICD-10-CM | POA: Diagnosis not present

## 2021-07-24 DIAGNOSIS — N186 End stage renal disease: Secondary | ICD-10-CM | POA: Diagnosis not present

## 2021-07-27 DIAGNOSIS — N186 End stage renal disease: Secondary | ICD-10-CM | POA: Diagnosis not present

## 2021-07-27 DIAGNOSIS — Z992 Dependence on renal dialysis: Secondary | ICD-10-CM | POA: Diagnosis not present

## 2021-07-27 DIAGNOSIS — D631 Anemia in chronic kidney disease: Secondary | ICD-10-CM | POA: Diagnosis not present

## 2021-07-29 DIAGNOSIS — N186 End stage renal disease: Secondary | ICD-10-CM | POA: Diagnosis not present

## 2021-07-29 DIAGNOSIS — D631 Anemia in chronic kidney disease: Secondary | ICD-10-CM | POA: Diagnosis not present

## 2021-07-29 DIAGNOSIS — Z992 Dependence on renal dialysis: Secondary | ICD-10-CM | POA: Diagnosis not present

## 2021-07-30 DIAGNOSIS — E032 Hypothyroidism due to medicaments and other exogenous substances: Secondary | ICD-10-CM | POA: Diagnosis not present

## 2021-07-30 DIAGNOSIS — E1122 Type 2 diabetes mellitus with diabetic chronic kidney disease: Secondary | ICD-10-CM | POA: Diagnosis not present

## 2021-07-31 DIAGNOSIS — D631 Anemia in chronic kidney disease: Secondary | ICD-10-CM | POA: Diagnosis not present

## 2021-07-31 DIAGNOSIS — N186 End stage renal disease: Secondary | ICD-10-CM | POA: Diagnosis not present

## 2021-07-31 DIAGNOSIS — Z992 Dependence on renal dialysis: Secondary | ICD-10-CM | POA: Diagnosis not present

## 2021-08-03 DIAGNOSIS — D631 Anemia in chronic kidney disease: Secondary | ICD-10-CM | POA: Diagnosis not present

## 2021-08-03 DIAGNOSIS — Z992 Dependence on renal dialysis: Secondary | ICD-10-CM | POA: Diagnosis not present

## 2021-08-03 DIAGNOSIS — N186 End stage renal disease: Secondary | ICD-10-CM | POA: Diagnosis not present

## 2021-08-04 DIAGNOSIS — I2510 Atherosclerotic heart disease of native coronary artery without angina pectoris: Secondary | ICD-10-CM | POA: Diagnosis not present

## 2021-08-04 DIAGNOSIS — E1122 Type 2 diabetes mellitus with diabetic chronic kidney disease: Secondary | ICD-10-CM | POA: Diagnosis not present

## 2021-08-04 DIAGNOSIS — E032 Hypothyroidism due to medicaments and other exogenous substances: Secondary | ICD-10-CM | POA: Diagnosis not present

## 2021-08-04 DIAGNOSIS — L89151 Pressure ulcer of sacral region, stage 1: Secondary | ICD-10-CM | POA: Diagnosis not present

## 2021-08-04 DIAGNOSIS — I498 Other specified cardiac arrhythmias: Secondary | ICD-10-CM | POA: Diagnosis not present

## 2021-08-04 DIAGNOSIS — D649 Anemia, unspecified: Secondary | ICD-10-CM | POA: Diagnosis not present

## 2021-08-04 DIAGNOSIS — I4891 Unspecified atrial fibrillation: Secondary | ICD-10-CM | POA: Diagnosis not present

## 2021-08-04 DIAGNOSIS — E785 Hyperlipidemia, unspecified: Secondary | ICD-10-CM | POA: Diagnosis not present

## 2021-08-04 DIAGNOSIS — M545 Low back pain, unspecified: Secondary | ICD-10-CM | POA: Diagnosis not present

## 2021-08-04 DIAGNOSIS — G4700 Insomnia, unspecified: Secondary | ICD-10-CM | POA: Diagnosis not present

## 2021-08-04 DIAGNOSIS — I10 Essential (primary) hypertension: Secondary | ICD-10-CM | POA: Diagnosis not present

## 2021-08-04 DIAGNOSIS — I351 Nonrheumatic aortic (valve) insufficiency: Secondary | ICD-10-CM | POA: Diagnosis not present

## 2021-08-05 DIAGNOSIS — D631 Anemia in chronic kidney disease: Secondary | ICD-10-CM | POA: Diagnosis not present

## 2021-08-05 DIAGNOSIS — Z992 Dependence on renal dialysis: Secondary | ICD-10-CM | POA: Diagnosis not present

## 2021-08-05 DIAGNOSIS — N186 End stage renal disease: Secondary | ICD-10-CM | POA: Diagnosis not present

## 2021-08-07 DIAGNOSIS — N186 End stage renal disease: Secondary | ICD-10-CM | POA: Diagnosis not present

## 2021-08-07 DIAGNOSIS — D631 Anemia in chronic kidney disease: Secondary | ICD-10-CM | POA: Diagnosis not present

## 2021-08-07 DIAGNOSIS — Z992 Dependence on renal dialysis: Secondary | ICD-10-CM | POA: Diagnosis not present

## 2021-08-10 DIAGNOSIS — D631 Anemia in chronic kidney disease: Secondary | ICD-10-CM | POA: Diagnosis not present

## 2021-08-10 DIAGNOSIS — N186 End stage renal disease: Secondary | ICD-10-CM | POA: Diagnosis not present

## 2021-08-10 DIAGNOSIS — Z992 Dependence on renal dialysis: Secondary | ICD-10-CM | POA: Diagnosis not present

## 2021-08-12 DIAGNOSIS — Z992 Dependence on renal dialysis: Secondary | ICD-10-CM | POA: Diagnosis not present

## 2021-08-12 DIAGNOSIS — D631 Anemia in chronic kidney disease: Secondary | ICD-10-CM | POA: Diagnosis not present

## 2021-08-12 DIAGNOSIS — N186 End stage renal disease: Secondary | ICD-10-CM | POA: Diagnosis not present

## 2021-08-14 DIAGNOSIS — D631 Anemia in chronic kidney disease: Secondary | ICD-10-CM | POA: Diagnosis not present

## 2021-08-14 DIAGNOSIS — N186 End stage renal disease: Secondary | ICD-10-CM | POA: Diagnosis not present

## 2021-08-14 DIAGNOSIS — Z992 Dependence on renal dialysis: Secondary | ICD-10-CM | POA: Diagnosis not present

## 2021-08-17 DIAGNOSIS — D631 Anemia in chronic kidney disease: Secondary | ICD-10-CM | POA: Diagnosis not present

## 2021-08-17 DIAGNOSIS — Z992 Dependence on renal dialysis: Secondary | ICD-10-CM | POA: Diagnosis not present

## 2021-08-17 DIAGNOSIS — N186 End stage renal disease: Secondary | ICD-10-CM | POA: Diagnosis not present

## 2021-08-18 DIAGNOSIS — E785 Hyperlipidemia, unspecified: Secondary | ICD-10-CM | POA: Diagnosis not present

## 2021-08-18 DIAGNOSIS — I509 Heart failure, unspecified: Secondary | ICD-10-CM | POA: Diagnosis not present

## 2021-08-18 DIAGNOSIS — I25810 Atherosclerosis of coronary artery bypass graft(s) without angina pectoris: Secondary | ICD-10-CM | POA: Diagnosis not present

## 2021-08-18 DIAGNOSIS — I10 Essential (primary) hypertension: Secondary | ICD-10-CM | POA: Diagnosis not present

## 2021-08-18 DIAGNOSIS — R0602 Shortness of breath: Secondary | ICD-10-CM | POA: Diagnosis not present

## 2021-08-18 DIAGNOSIS — I4891 Unspecified atrial fibrillation: Secondary | ICD-10-CM | POA: Diagnosis not present

## 2021-08-18 DIAGNOSIS — I340 Nonrheumatic mitral (valve) insufficiency: Secondary | ICD-10-CM | POA: Diagnosis not present

## 2021-08-18 DIAGNOSIS — I2510 Atherosclerotic heart disease of native coronary artery without angina pectoris: Secondary | ICD-10-CM | POA: Diagnosis not present

## 2021-08-19 DIAGNOSIS — D631 Anemia in chronic kidney disease: Secondary | ICD-10-CM | POA: Diagnosis not present

## 2021-08-19 DIAGNOSIS — Z992 Dependence on renal dialysis: Secondary | ICD-10-CM | POA: Diagnosis not present

## 2021-08-19 DIAGNOSIS — N186 End stage renal disease: Secondary | ICD-10-CM | POA: Diagnosis not present

## 2021-08-21 DIAGNOSIS — Z992 Dependence on renal dialysis: Secondary | ICD-10-CM | POA: Diagnosis not present

## 2021-08-21 DIAGNOSIS — N186 End stage renal disease: Secondary | ICD-10-CM | POA: Diagnosis not present

## 2021-08-21 DIAGNOSIS — D631 Anemia in chronic kidney disease: Secondary | ICD-10-CM | POA: Diagnosis not present

## 2021-08-24 DIAGNOSIS — D509 Iron deficiency anemia, unspecified: Secondary | ICD-10-CM | POA: Diagnosis not present

## 2021-08-24 DIAGNOSIS — Z992 Dependence on renal dialysis: Secondary | ICD-10-CM | POA: Diagnosis not present

## 2021-08-24 DIAGNOSIS — N186 End stage renal disease: Secondary | ICD-10-CM | POA: Diagnosis not present

## 2021-08-24 DIAGNOSIS — N250 Renal osteodystrophy: Secondary | ICD-10-CM | POA: Diagnosis not present

## 2021-08-24 DIAGNOSIS — D631 Anemia in chronic kidney disease: Secondary | ICD-10-CM | POA: Diagnosis not present

## 2021-08-25 DIAGNOSIS — J208 Acute bronchitis due to other specified organisms: Secondary | ICD-10-CM | POA: Diagnosis not present

## 2021-08-25 DIAGNOSIS — E785 Hyperlipidemia, unspecified: Secondary | ICD-10-CM | POA: Diagnosis not present

## 2021-08-25 DIAGNOSIS — D649 Anemia, unspecified: Secondary | ICD-10-CM | POA: Diagnosis not present

## 2021-08-25 DIAGNOSIS — M545 Low back pain, unspecified: Secondary | ICD-10-CM | POA: Diagnosis not present

## 2021-08-25 DIAGNOSIS — L89151 Pressure ulcer of sacral region, stage 1: Secondary | ICD-10-CM | POA: Diagnosis not present

## 2021-08-25 DIAGNOSIS — E032 Hypothyroidism due to medicaments and other exogenous substances: Secondary | ICD-10-CM | POA: Diagnosis not present

## 2021-08-25 DIAGNOSIS — I2510 Atherosclerotic heart disease of native coronary artery without angina pectoris: Secondary | ICD-10-CM | POA: Diagnosis not present

## 2021-08-25 DIAGNOSIS — G4700 Insomnia, unspecified: Secondary | ICD-10-CM | POA: Diagnosis not present

## 2021-08-25 DIAGNOSIS — I10 Essential (primary) hypertension: Secondary | ICD-10-CM | POA: Diagnosis not present

## 2021-08-25 DIAGNOSIS — E1122 Type 2 diabetes mellitus with diabetic chronic kidney disease: Secondary | ICD-10-CM | POA: Diagnosis not present

## 2021-08-25 DIAGNOSIS — I498 Other specified cardiac arrhythmias: Secondary | ICD-10-CM | POA: Diagnosis not present

## 2021-08-25 DIAGNOSIS — I4891 Unspecified atrial fibrillation: Secondary | ICD-10-CM | POA: Diagnosis not present

## 2021-08-26 DIAGNOSIS — D631 Anemia in chronic kidney disease: Secondary | ICD-10-CM | POA: Diagnosis not present

## 2021-08-26 DIAGNOSIS — N250 Renal osteodystrophy: Secondary | ICD-10-CM | POA: Diagnosis not present

## 2021-08-26 DIAGNOSIS — Z992 Dependence on renal dialysis: Secondary | ICD-10-CM | POA: Diagnosis not present

## 2021-08-26 DIAGNOSIS — N186 End stage renal disease: Secondary | ICD-10-CM | POA: Diagnosis not present

## 2021-08-26 DIAGNOSIS — D509 Iron deficiency anemia, unspecified: Secondary | ICD-10-CM | POA: Diagnosis not present

## 2021-08-27 DIAGNOSIS — I351 Nonrheumatic aortic (valve) insufficiency: Secondary | ICD-10-CM | POA: Diagnosis not present

## 2021-08-27 DIAGNOSIS — E663 Overweight: Secondary | ICD-10-CM | POA: Diagnosis not present

## 2021-08-27 DIAGNOSIS — E785 Hyperlipidemia, unspecified: Secondary | ICD-10-CM | POA: Diagnosis not present

## 2021-08-27 DIAGNOSIS — Z9861 Coronary angioplasty status: Secondary | ICD-10-CM | POA: Diagnosis not present

## 2021-08-27 DIAGNOSIS — R0600 Dyspnea, unspecified: Secondary | ICD-10-CM | POA: Diagnosis not present

## 2021-08-27 DIAGNOSIS — I340 Nonrheumatic mitral (valve) insufficiency: Secondary | ICD-10-CM | POA: Diagnosis not present

## 2021-08-27 DIAGNOSIS — E119 Type 2 diabetes mellitus without complications: Secondary | ICD-10-CM | POA: Diagnosis not present

## 2021-08-27 DIAGNOSIS — I25810 Atherosclerosis of coronary artery bypass graft(s) without angina pectoris: Secondary | ICD-10-CM | POA: Diagnosis not present

## 2021-08-27 DIAGNOSIS — I2510 Atherosclerotic heart disease of native coronary artery without angina pectoris: Secondary | ICD-10-CM | POA: Diagnosis not present

## 2021-08-27 DIAGNOSIS — I509 Heart failure, unspecified: Secondary | ICD-10-CM | POA: Diagnosis not present

## 2021-08-27 DIAGNOSIS — I10 Essential (primary) hypertension: Secondary | ICD-10-CM | POA: Diagnosis not present

## 2021-08-27 DIAGNOSIS — I4891 Unspecified atrial fibrillation: Secondary | ICD-10-CM | POA: Diagnosis not present

## 2021-08-28 DIAGNOSIS — N186 End stage renal disease: Secondary | ICD-10-CM | POA: Diagnosis not present

## 2021-08-28 DIAGNOSIS — N250 Renal osteodystrophy: Secondary | ICD-10-CM | POA: Diagnosis not present

## 2021-08-28 DIAGNOSIS — D509 Iron deficiency anemia, unspecified: Secondary | ICD-10-CM | POA: Diagnosis not present

## 2021-08-28 DIAGNOSIS — D631 Anemia in chronic kidney disease: Secondary | ICD-10-CM | POA: Diagnosis not present

## 2021-08-28 DIAGNOSIS — Z992 Dependence on renal dialysis: Secondary | ICD-10-CM | POA: Diagnosis not present

## 2021-08-31 DIAGNOSIS — N250 Renal osteodystrophy: Secondary | ICD-10-CM | POA: Diagnosis not present

## 2021-08-31 DIAGNOSIS — N186 End stage renal disease: Secondary | ICD-10-CM | POA: Diagnosis not present

## 2021-08-31 DIAGNOSIS — D509 Iron deficiency anemia, unspecified: Secondary | ICD-10-CM | POA: Diagnosis not present

## 2021-08-31 DIAGNOSIS — D631 Anemia in chronic kidney disease: Secondary | ICD-10-CM | POA: Diagnosis not present

## 2021-08-31 DIAGNOSIS — Z992 Dependence on renal dialysis: Secondary | ICD-10-CM | POA: Diagnosis not present

## 2021-09-01 DIAGNOSIS — Z9861 Coronary angioplasty status: Secondary | ICD-10-CM | POA: Diagnosis not present

## 2021-09-01 DIAGNOSIS — E663 Overweight: Secondary | ICD-10-CM | POA: Diagnosis not present

## 2021-09-01 DIAGNOSIS — E119 Type 2 diabetes mellitus without complications: Secondary | ICD-10-CM | POA: Diagnosis not present

## 2021-09-01 DIAGNOSIS — E785 Hyperlipidemia, unspecified: Secondary | ICD-10-CM | POA: Diagnosis not present

## 2021-09-01 DIAGNOSIS — I2510 Atherosclerotic heart disease of native coronary artery without angina pectoris: Secondary | ICD-10-CM | POA: Diagnosis not present

## 2021-09-01 DIAGNOSIS — I340 Nonrheumatic mitral (valve) insufficiency: Secondary | ICD-10-CM | POA: Diagnosis not present

## 2021-09-01 DIAGNOSIS — I10 Essential (primary) hypertension: Secondary | ICD-10-CM | POA: Diagnosis not present

## 2021-09-01 DIAGNOSIS — R0600 Dyspnea, unspecified: Secondary | ICD-10-CM | POA: Diagnosis not present

## 2021-09-01 DIAGNOSIS — I25810 Atherosclerosis of coronary artery bypass graft(s) without angina pectoris: Secondary | ICD-10-CM | POA: Diagnosis not present

## 2021-09-01 DIAGNOSIS — I351 Nonrheumatic aortic (valve) insufficiency: Secondary | ICD-10-CM | POA: Diagnosis not present

## 2021-09-01 DIAGNOSIS — I509 Heart failure, unspecified: Secondary | ICD-10-CM | POA: Diagnosis not present

## 2021-09-01 DIAGNOSIS — I4891 Unspecified atrial fibrillation: Secondary | ICD-10-CM | POA: Diagnosis not present

## 2021-09-02 DIAGNOSIS — D509 Iron deficiency anemia, unspecified: Secondary | ICD-10-CM | POA: Diagnosis not present

## 2021-09-02 DIAGNOSIS — Z992 Dependence on renal dialysis: Secondary | ICD-10-CM | POA: Diagnosis not present

## 2021-09-02 DIAGNOSIS — N186 End stage renal disease: Secondary | ICD-10-CM | POA: Diagnosis not present

## 2021-09-02 DIAGNOSIS — D631 Anemia in chronic kidney disease: Secondary | ICD-10-CM | POA: Diagnosis not present

## 2021-09-02 DIAGNOSIS — N250 Renal osteodystrophy: Secondary | ICD-10-CM | POA: Diagnosis not present

## 2021-09-04 DIAGNOSIS — Z992 Dependence on renal dialysis: Secondary | ICD-10-CM | POA: Diagnosis not present

## 2021-09-04 DIAGNOSIS — N250 Renal osteodystrophy: Secondary | ICD-10-CM | POA: Diagnosis not present

## 2021-09-04 DIAGNOSIS — N186 End stage renal disease: Secondary | ICD-10-CM | POA: Diagnosis not present

## 2021-09-04 DIAGNOSIS — D631 Anemia in chronic kidney disease: Secondary | ICD-10-CM | POA: Diagnosis not present

## 2021-09-04 DIAGNOSIS — D509 Iron deficiency anemia, unspecified: Secondary | ICD-10-CM | POA: Diagnosis not present

## 2021-09-07 DIAGNOSIS — N186 End stage renal disease: Secondary | ICD-10-CM | POA: Diagnosis not present

## 2021-09-07 DIAGNOSIS — Z992 Dependence on renal dialysis: Secondary | ICD-10-CM | POA: Diagnosis not present

## 2021-09-07 DIAGNOSIS — D509 Iron deficiency anemia, unspecified: Secondary | ICD-10-CM | POA: Diagnosis not present

## 2021-09-07 DIAGNOSIS — N250 Renal osteodystrophy: Secondary | ICD-10-CM | POA: Diagnosis not present

## 2021-09-07 DIAGNOSIS — D631 Anemia in chronic kidney disease: Secondary | ICD-10-CM | POA: Diagnosis not present

## 2021-09-09 DIAGNOSIS — N250 Renal osteodystrophy: Secondary | ICD-10-CM | POA: Diagnosis not present

## 2021-09-09 DIAGNOSIS — N186 End stage renal disease: Secondary | ICD-10-CM | POA: Diagnosis not present

## 2021-09-09 DIAGNOSIS — Z992 Dependence on renal dialysis: Secondary | ICD-10-CM | POA: Diagnosis not present

## 2021-09-09 DIAGNOSIS — D509 Iron deficiency anemia, unspecified: Secondary | ICD-10-CM | POA: Diagnosis not present

## 2021-09-09 DIAGNOSIS — D631 Anemia in chronic kidney disease: Secondary | ICD-10-CM | POA: Diagnosis not present

## 2021-09-11 DIAGNOSIS — D509 Iron deficiency anemia, unspecified: Secondary | ICD-10-CM | POA: Diagnosis not present

## 2021-09-11 DIAGNOSIS — N186 End stage renal disease: Secondary | ICD-10-CM | POA: Diagnosis not present

## 2021-09-11 DIAGNOSIS — D631 Anemia in chronic kidney disease: Secondary | ICD-10-CM | POA: Diagnosis not present

## 2021-09-11 DIAGNOSIS — N250 Renal osteodystrophy: Secondary | ICD-10-CM | POA: Diagnosis not present

## 2021-09-11 DIAGNOSIS — Z992 Dependence on renal dialysis: Secondary | ICD-10-CM | POA: Diagnosis not present

## 2021-09-14 DIAGNOSIS — N250 Renal osteodystrophy: Secondary | ICD-10-CM | POA: Diagnosis not present

## 2021-09-14 DIAGNOSIS — Z992 Dependence on renal dialysis: Secondary | ICD-10-CM | POA: Diagnosis not present

## 2021-09-14 DIAGNOSIS — D509 Iron deficiency anemia, unspecified: Secondary | ICD-10-CM | POA: Diagnosis not present

## 2021-09-14 DIAGNOSIS — N186 End stage renal disease: Secondary | ICD-10-CM | POA: Diagnosis not present

## 2021-09-14 DIAGNOSIS — D631 Anemia in chronic kidney disease: Secondary | ICD-10-CM | POA: Diagnosis not present

## 2021-09-16 DIAGNOSIS — N250 Renal osteodystrophy: Secondary | ICD-10-CM | POA: Diagnosis not present

## 2021-09-16 DIAGNOSIS — N186 End stage renal disease: Secondary | ICD-10-CM | POA: Diagnosis not present

## 2021-09-16 DIAGNOSIS — Z992 Dependence on renal dialysis: Secondary | ICD-10-CM | POA: Diagnosis not present

## 2021-09-16 DIAGNOSIS — D509 Iron deficiency anemia, unspecified: Secondary | ICD-10-CM | POA: Diagnosis not present

## 2021-09-16 DIAGNOSIS — D631 Anemia in chronic kidney disease: Secondary | ICD-10-CM | POA: Diagnosis not present

## 2021-09-18 DIAGNOSIS — D631 Anemia in chronic kidney disease: Secondary | ICD-10-CM | POA: Diagnosis not present

## 2021-09-18 DIAGNOSIS — Z992 Dependence on renal dialysis: Secondary | ICD-10-CM | POA: Diagnosis not present

## 2021-09-18 DIAGNOSIS — N250 Renal osteodystrophy: Secondary | ICD-10-CM | POA: Diagnosis not present

## 2021-09-18 DIAGNOSIS — N186 End stage renal disease: Secondary | ICD-10-CM | POA: Diagnosis not present

## 2021-09-18 DIAGNOSIS — D509 Iron deficiency anemia, unspecified: Secondary | ICD-10-CM | POA: Diagnosis not present

## 2021-09-20 DIAGNOSIS — Z992 Dependence on renal dialysis: Secondary | ICD-10-CM | POA: Diagnosis not present

## 2021-09-20 DIAGNOSIS — N186 End stage renal disease: Secondary | ICD-10-CM | POA: Diagnosis not present

## 2021-09-21 DIAGNOSIS — D631 Anemia in chronic kidney disease: Secondary | ICD-10-CM | POA: Diagnosis not present

## 2021-09-21 DIAGNOSIS — N186 End stage renal disease: Secondary | ICD-10-CM | POA: Diagnosis not present

## 2021-09-21 DIAGNOSIS — N250 Renal osteodystrophy: Secondary | ICD-10-CM | POA: Diagnosis not present

## 2021-09-21 DIAGNOSIS — D509 Iron deficiency anemia, unspecified: Secondary | ICD-10-CM | POA: Diagnosis not present

## 2021-09-21 DIAGNOSIS — Z992 Dependence on renal dialysis: Secondary | ICD-10-CM | POA: Diagnosis not present

## 2021-09-22 DIAGNOSIS — Z23 Encounter for immunization: Secondary | ICD-10-CM | POA: Diagnosis not present

## 2021-09-23 DIAGNOSIS — Z992 Dependence on renal dialysis: Secondary | ICD-10-CM | POA: Diagnosis not present

## 2021-09-23 DIAGNOSIS — N250 Renal osteodystrophy: Secondary | ICD-10-CM | POA: Diagnosis not present

## 2021-09-23 DIAGNOSIS — N186 End stage renal disease: Secondary | ICD-10-CM | POA: Diagnosis not present

## 2021-09-23 DIAGNOSIS — D631 Anemia in chronic kidney disease: Secondary | ICD-10-CM | POA: Diagnosis not present

## 2021-09-23 DIAGNOSIS — D509 Iron deficiency anemia, unspecified: Secondary | ICD-10-CM | POA: Diagnosis not present

## 2021-09-25 DIAGNOSIS — Z992 Dependence on renal dialysis: Secondary | ICD-10-CM | POA: Diagnosis not present

## 2021-09-25 DIAGNOSIS — D509 Iron deficiency anemia, unspecified: Secondary | ICD-10-CM | POA: Diagnosis not present

## 2021-09-25 DIAGNOSIS — N250 Renal osteodystrophy: Secondary | ICD-10-CM | POA: Diagnosis not present

## 2021-09-25 DIAGNOSIS — N186 End stage renal disease: Secondary | ICD-10-CM | POA: Diagnosis not present

## 2021-09-25 DIAGNOSIS — D631 Anemia in chronic kidney disease: Secondary | ICD-10-CM | POA: Diagnosis not present

## 2021-09-28 DIAGNOSIS — N250 Renal osteodystrophy: Secondary | ICD-10-CM | POA: Diagnosis not present

## 2021-09-28 DIAGNOSIS — N186 End stage renal disease: Secondary | ICD-10-CM | POA: Diagnosis not present

## 2021-09-28 DIAGNOSIS — D509 Iron deficiency anemia, unspecified: Secondary | ICD-10-CM | POA: Diagnosis not present

## 2021-09-28 DIAGNOSIS — D631 Anemia in chronic kidney disease: Secondary | ICD-10-CM | POA: Diagnosis not present

## 2021-09-28 DIAGNOSIS — Z992 Dependence on renal dialysis: Secondary | ICD-10-CM | POA: Diagnosis not present

## 2021-09-30 DIAGNOSIS — Z992 Dependence on renal dialysis: Secondary | ICD-10-CM | POA: Diagnosis not present

## 2021-09-30 DIAGNOSIS — D631 Anemia in chronic kidney disease: Secondary | ICD-10-CM | POA: Diagnosis not present

## 2021-09-30 DIAGNOSIS — N186 End stage renal disease: Secondary | ICD-10-CM | POA: Diagnosis not present

## 2021-09-30 DIAGNOSIS — D509 Iron deficiency anemia, unspecified: Secondary | ICD-10-CM | POA: Diagnosis not present

## 2021-09-30 DIAGNOSIS — N250 Renal osteodystrophy: Secondary | ICD-10-CM | POA: Diagnosis not present

## 2021-10-02 DIAGNOSIS — D631 Anemia in chronic kidney disease: Secondary | ICD-10-CM | POA: Diagnosis not present

## 2021-10-02 DIAGNOSIS — D509 Iron deficiency anemia, unspecified: Secondary | ICD-10-CM | POA: Diagnosis not present

## 2021-10-02 DIAGNOSIS — N250 Renal osteodystrophy: Secondary | ICD-10-CM | POA: Diagnosis not present

## 2021-10-02 DIAGNOSIS — N186 End stage renal disease: Secondary | ICD-10-CM | POA: Diagnosis not present

## 2021-10-02 DIAGNOSIS — Z992 Dependence on renal dialysis: Secondary | ICD-10-CM | POA: Diagnosis not present

## 2021-10-05 DIAGNOSIS — D509 Iron deficiency anemia, unspecified: Secondary | ICD-10-CM | POA: Diagnosis not present

## 2021-10-05 DIAGNOSIS — D631 Anemia in chronic kidney disease: Secondary | ICD-10-CM | POA: Diagnosis not present

## 2021-10-05 DIAGNOSIS — N250 Renal osteodystrophy: Secondary | ICD-10-CM | POA: Diagnosis not present

## 2021-10-05 DIAGNOSIS — N186 End stage renal disease: Secondary | ICD-10-CM | POA: Diagnosis not present

## 2021-10-05 DIAGNOSIS — Z992 Dependence on renal dialysis: Secondary | ICD-10-CM | POA: Diagnosis not present

## 2021-10-07 DIAGNOSIS — Z992 Dependence on renal dialysis: Secondary | ICD-10-CM | POA: Diagnosis not present

## 2021-10-07 DIAGNOSIS — D631 Anemia in chronic kidney disease: Secondary | ICD-10-CM | POA: Diagnosis not present

## 2021-10-07 DIAGNOSIS — N250 Renal osteodystrophy: Secondary | ICD-10-CM | POA: Diagnosis not present

## 2021-10-07 DIAGNOSIS — N186 End stage renal disease: Secondary | ICD-10-CM | POA: Diagnosis not present

## 2021-10-07 DIAGNOSIS — D509 Iron deficiency anemia, unspecified: Secondary | ICD-10-CM | POA: Diagnosis not present

## 2021-10-09 DIAGNOSIS — D509 Iron deficiency anemia, unspecified: Secondary | ICD-10-CM | POA: Diagnosis not present

## 2021-10-09 DIAGNOSIS — Z992 Dependence on renal dialysis: Secondary | ICD-10-CM | POA: Diagnosis not present

## 2021-10-09 DIAGNOSIS — D631 Anemia in chronic kidney disease: Secondary | ICD-10-CM | POA: Diagnosis not present

## 2021-10-09 DIAGNOSIS — N250 Renal osteodystrophy: Secondary | ICD-10-CM | POA: Diagnosis not present

## 2021-10-09 DIAGNOSIS — N186 End stage renal disease: Secondary | ICD-10-CM | POA: Diagnosis not present

## 2021-10-12 DIAGNOSIS — N186 End stage renal disease: Secondary | ICD-10-CM | POA: Diagnosis not present

## 2021-10-12 DIAGNOSIS — D509 Iron deficiency anemia, unspecified: Secondary | ICD-10-CM | POA: Diagnosis not present

## 2021-10-12 DIAGNOSIS — N250 Renal osteodystrophy: Secondary | ICD-10-CM | POA: Diagnosis not present

## 2021-10-12 DIAGNOSIS — Z992 Dependence on renal dialysis: Secondary | ICD-10-CM | POA: Diagnosis not present

## 2021-10-12 DIAGNOSIS — D631 Anemia in chronic kidney disease: Secondary | ICD-10-CM | POA: Diagnosis not present

## 2021-10-14 DIAGNOSIS — N186 End stage renal disease: Secondary | ICD-10-CM | POA: Diagnosis not present

## 2021-10-14 DIAGNOSIS — N250 Renal osteodystrophy: Secondary | ICD-10-CM | POA: Diagnosis not present

## 2021-10-14 DIAGNOSIS — D631 Anemia in chronic kidney disease: Secondary | ICD-10-CM | POA: Diagnosis not present

## 2021-10-14 DIAGNOSIS — Z992 Dependence on renal dialysis: Secondary | ICD-10-CM | POA: Diagnosis not present

## 2021-10-14 DIAGNOSIS — D509 Iron deficiency anemia, unspecified: Secondary | ICD-10-CM | POA: Diagnosis not present

## 2021-10-16 DIAGNOSIS — D509 Iron deficiency anemia, unspecified: Secondary | ICD-10-CM | POA: Diagnosis not present

## 2021-10-16 DIAGNOSIS — N250 Renal osteodystrophy: Secondary | ICD-10-CM | POA: Diagnosis not present

## 2021-10-16 DIAGNOSIS — D631 Anemia in chronic kidney disease: Secondary | ICD-10-CM | POA: Diagnosis not present

## 2021-10-16 DIAGNOSIS — N186 End stage renal disease: Secondary | ICD-10-CM | POA: Diagnosis not present

## 2021-10-16 DIAGNOSIS — Z992 Dependence on renal dialysis: Secondary | ICD-10-CM | POA: Diagnosis not present

## 2021-10-19 DIAGNOSIS — N186 End stage renal disease: Secondary | ICD-10-CM | POA: Diagnosis not present

## 2021-10-19 DIAGNOSIS — Z992 Dependence on renal dialysis: Secondary | ICD-10-CM | POA: Diagnosis not present

## 2021-10-19 DIAGNOSIS — N250 Renal osteodystrophy: Secondary | ICD-10-CM | POA: Diagnosis not present

## 2021-10-19 DIAGNOSIS — D631 Anemia in chronic kidney disease: Secondary | ICD-10-CM | POA: Diagnosis not present

## 2021-10-19 DIAGNOSIS — D509 Iron deficiency anemia, unspecified: Secondary | ICD-10-CM | POA: Diagnosis not present

## 2021-10-21 DIAGNOSIS — N250 Renal osteodystrophy: Secondary | ICD-10-CM | POA: Diagnosis not present

## 2021-10-21 DIAGNOSIS — D631 Anemia in chronic kidney disease: Secondary | ICD-10-CM | POA: Diagnosis not present

## 2021-10-21 DIAGNOSIS — N186 End stage renal disease: Secondary | ICD-10-CM | POA: Diagnosis not present

## 2021-10-21 DIAGNOSIS — D509 Iron deficiency anemia, unspecified: Secondary | ICD-10-CM | POA: Diagnosis not present

## 2021-10-21 DIAGNOSIS — Z992 Dependence on renal dialysis: Secondary | ICD-10-CM | POA: Diagnosis not present

## 2021-10-23 DIAGNOSIS — N250 Renal osteodystrophy: Secondary | ICD-10-CM | POA: Diagnosis not present

## 2021-10-23 DIAGNOSIS — D509 Iron deficiency anemia, unspecified: Secondary | ICD-10-CM | POA: Diagnosis not present

## 2021-10-23 DIAGNOSIS — Z992 Dependence on renal dialysis: Secondary | ICD-10-CM | POA: Diagnosis not present

## 2021-10-23 DIAGNOSIS — N186 End stage renal disease: Secondary | ICD-10-CM | POA: Diagnosis not present

## 2021-10-23 DIAGNOSIS — D631 Anemia in chronic kidney disease: Secondary | ICD-10-CM | POA: Diagnosis not present

## 2021-10-26 DIAGNOSIS — Z992 Dependence on renal dialysis: Secondary | ICD-10-CM | POA: Diagnosis not present

## 2021-10-26 DIAGNOSIS — N250 Renal osteodystrophy: Secondary | ICD-10-CM | POA: Diagnosis not present

## 2021-10-26 DIAGNOSIS — D509 Iron deficiency anemia, unspecified: Secondary | ICD-10-CM | POA: Diagnosis not present

## 2021-10-26 DIAGNOSIS — D631 Anemia in chronic kidney disease: Secondary | ICD-10-CM | POA: Diagnosis not present

## 2021-10-26 DIAGNOSIS — N186 End stage renal disease: Secondary | ICD-10-CM | POA: Diagnosis not present

## 2021-10-28 DIAGNOSIS — Z992 Dependence on renal dialysis: Secondary | ICD-10-CM | POA: Diagnosis not present

## 2021-10-28 DIAGNOSIS — N250 Renal osteodystrophy: Secondary | ICD-10-CM | POA: Diagnosis not present

## 2021-10-28 DIAGNOSIS — D631 Anemia in chronic kidney disease: Secondary | ICD-10-CM | POA: Diagnosis not present

## 2021-10-28 DIAGNOSIS — D509 Iron deficiency anemia, unspecified: Secondary | ICD-10-CM | POA: Diagnosis not present

## 2021-10-28 DIAGNOSIS — N186 End stage renal disease: Secondary | ICD-10-CM | POA: Diagnosis not present

## 2021-10-30 DIAGNOSIS — D509 Iron deficiency anemia, unspecified: Secondary | ICD-10-CM | POA: Diagnosis not present

## 2021-10-30 DIAGNOSIS — D631 Anemia in chronic kidney disease: Secondary | ICD-10-CM | POA: Diagnosis not present

## 2021-10-30 DIAGNOSIS — N186 End stage renal disease: Secondary | ICD-10-CM | POA: Diagnosis not present

## 2021-10-30 DIAGNOSIS — N250 Renal osteodystrophy: Secondary | ICD-10-CM | POA: Diagnosis not present

## 2021-10-30 DIAGNOSIS — Z992 Dependence on renal dialysis: Secondary | ICD-10-CM | POA: Diagnosis not present

## 2021-11-02 DIAGNOSIS — D631 Anemia in chronic kidney disease: Secondary | ICD-10-CM | POA: Diagnosis not present

## 2021-11-02 DIAGNOSIS — N250 Renal osteodystrophy: Secondary | ICD-10-CM | POA: Diagnosis not present

## 2021-11-02 DIAGNOSIS — Z992 Dependence on renal dialysis: Secondary | ICD-10-CM | POA: Diagnosis not present

## 2021-11-02 DIAGNOSIS — N186 End stage renal disease: Secondary | ICD-10-CM | POA: Diagnosis not present

## 2021-11-02 DIAGNOSIS — D509 Iron deficiency anemia, unspecified: Secondary | ICD-10-CM | POA: Diagnosis not present

## 2021-11-04 ENCOUNTER — Other Ambulatory Visit (INDEPENDENT_AMBULATORY_CARE_PROVIDER_SITE_OTHER): Payer: Self-pay | Admitting: Nurse Practitioner

## 2021-11-04 DIAGNOSIS — Z992 Dependence on renal dialysis: Secondary | ICD-10-CM | POA: Diagnosis not present

## 2021-11-04 DIAGNOSIS — N250 Renal osteodystrophy: Secondary | ICD-10-CM | POA: Diagnosis not present

## 2021-11-04 DIAGNOSIS — N186 End stage renal disease: Secondary | ICD-10-CM

## 2021-11-04 DIAGNOSIS — D631 Anemia in chronic kidney disease: Secondary | ICD-10-CM | POA: Diagnosis not present

## 2021-11-04 DIAGNOSIS — D509 Iron deficiency anemia, unspecified: Secondary | ICD-10-CM | POA: Diagnosis not present

## 2021-11-06 ENCOUNTER — Ambulatory Visit (INDEPENDENT_AMBULATORY_CARE_PROVIDER_SITE_OTHER): Payer: Medicare Other

## 2021-11-06 ENCOUNTER — Ambulatory Visit (INDEPENDENT_AMBULATORY_CARE_PROVIDER_SITE_OTHER): Payer: Medicare Other | Admitting: Vascular Surgery

## 2021-11-06 DIAGNOSIS — N186 End stage renal disease: Secondary | ICD-10-CM

## 2021-11-06 DIAGNOSIS — N250 Renal osteodystrophy: Secondary | ICD-10-CM | POA: Diagnosis not present

## 2021-11-06 DIAGNOSIS — Z992 Dependence on renal dialysis: Secondary | ICD-10-CM | POA: Diagnosis not present

## 2021-11-06 DIAGNOSIS — D509 Iron deficiency anemia, unspecified: Secondary | ICD-10-CM | POA: Diagnosis not present

## 2021-11-06 DIAGNOSIS — D631 Anemia in chronic kidney disease: Secondary | ICD-10-CM | POA: Diagnosis not present

## 2021-11-09 DIAGNOSIS — N250 Renal osteodystrophy: Secondary | ICD-10-CM | POA: Diagnosis not present

## 2021-11-09 DIAGNOSIS — Z992 Dependence on renal dialysis: Secondary | ICD-10-CM | POA: Diagnosis not present

## 2021-11-09 DIAGNOSIS — N186 End stage renal disease: Secondary | ICD-10-CM | POA: Diagnosis not present

## 2021-11-09 DIAGNOSIS — D509 Iron deficiency anemia, unspecified: Secondary | ICD-10-CM | POA: Diagnosis not present

## 2021-11-09 DIAGNOSIS — D631 Anemia in chronic kidney disease: Secondary | ICD-10-CM | POA: Diagnosis not present

## 2021-11-11 DIAGNOSIS — D509 Iron deficiency anemia, unspecified: Secondary | ICD-10-CM | POA: Diagnosis not present

## 2021-11-11 DIAGNOSIS — N250 Renal osteodystrophy: Secondary | ICD-10-CM | POA: Diagnosis not present

## 2021-11-11 DIAGNOSIS — D631 Anemia in chronic kidney disease: Secondary | ICD-10-CM | POA: Diagnosis not present

## 2021-11-11 DIAGNOSIS — N186 End stage renal disease: Secondary | ICD-10-CM | POA: Diagnosis not present

## 2021-11-11 DIAGNOSIS — Z992 Dependence on renal dialysis: Secondary | ICD-10-CM | POA: Diagnosis not present

## 2021-11-13 DIAGNOSIS — Z992 Dependence on renal dialysis: Secondary | ICD-10-CM | POA: Diagnosis not present

## 2021-11-13 DIAGNOSIS — N250 Renal osteodystrophy: Secondary | ICD-10-CM | POA: Diagnosis not present

## 2021-11-13 DIAGNOSIS — N186 End stage renal disease: Secondary | ICD-10-CM | POA: Diagnosis not present

## 2021-11-13 DIAGNOSIS — D509 Iron deficiency anemia, unspecified: Secondary | ICD-10-CM | POA: Diagnosis not present

## 2021-11-13 DIAGNOSIS — D631 Anemia in chronic kidney disease: Secondary | ICD-10-CM | POA: Diagnosis not present

## 2021-11-16 DIAGNOSIS — Z992 Dependence on renal dialysis: Secondary | ICD-10-CM | POA: Diagnosis not present

## 2021-11-16 DIAGNOSIS — N186 End stage renal disease: Secondary | ICD-10-CM | POA: Diagnosis not present

## 2021-11-16 DIAGNOSIS — D631 Anemia in chronic kidney disease: Secondary | ICD-10-CM | POA: Diagnosis not present

## 2021-11-16 DIAGNOSIS — D509 Iron deficiency anemia, unspecified: Secondary | ICD-10-CM | POA: Diagnosis not present

## 2021-11-16 DIAGNOSIS — N250 Renal osteodystrophy: Secondary | ICD-10-CM | POA: Diagnosis not present

## 2021-11-18 DIAGNOSIS — D631 Anemia in chronic kidney disease: Secondary | ICD-10-CM | POA: Diagnosis not present

## 2021-11-18 DIAGNOSIS — Z992 Dependence on renal dialysis: Secondary | ICD-10-CM | POA: Diagnosis not present

## 2021-11-18 DIAGNOSIS — D509 Iron deficiency anemia, unspecified: Secondary | ICD-10-CM | POA: Diagnosis not present

## 2021-11-18 DIAGNOSIS — N250 Renal osteodystrophy: Secondary | ICD-10-CM | POA: Diagnosis not present

## 2021-11-18 DIAGNOSIS — N186 End stage renal disease: Secondary | ICD-10-CM | POA: Diagnosis not present

## 2021-11-20 DIAGNOSIS — D509 Iron deficiency anemia, unspecified: Secondary | ICD-10-CM | POA: Diagnosis not present

## 2021-11-20 DIAGNOSIS — D631 Anemia in chronic kidney disease: Secondary | ICD-10-CM | POA: Diagnosis not present

## 2021-11-20 DIAGNOSIS — N250 Renal osteodystrophy: Secondary | ICD-10-CM | POA: Diagnosis not present

## 2021-11-20 DIAGNOSIS — Z992 Dependence on renal dialysis: Secondary | ICD-10-CM | POA: Diagnosis not present

## 2021-11-20 DIAGNOSIS — N186 End stage renal disease: Secondary | ICD-10-CM | POA: Diagnosis not present

## 2021-11-23 DIAGNOSIS — N250 Renal osteodystrophy: Secondary | ICD-10-CM | POA: Diagnosis not present

## 2021-11-23 DIAGNOSIS — N186 End stage renal disease: Secondary | ICD-10-CM | POA: Diagnosis not present

## 2021-11-23 DIAGNOSIS — Z992 Dependence on renal dialysis: Secondary | ICD-10-CM | POA: Diagnosis not present

## 2021-11-23 DIAGNOSIS — D509 Iron deficiency anemia, unspecified: Secondary | ICD-10-CM | POA: Diagnosis not present

## 2021-11-25 DIAGNOSIS — N250 Renal osteodystrophy: Secondary | ICD-10-CM | POA: Diagnosis not present

## 2021-11-25 DIAGNOSIS — D509 Iron deficiency anemia, unspecified: Secondary | ICD-10-CM | POA: Diagnosis not present

## 2021-11-25 DIAGNOSIS — N186 End stage renal disease: Secondary | ICD-10-CM | POA: Diagnosis not present

## 2021-11-25 DIAGNOSIS — Z992 Dependence on renal dialysis: Secondary | ICD-10-CM | POA: Diagnosis not present

## 2021-11-27 DIAGNOSIS — D509 Iron deficiency anemia, unspecified: Secondary | ICD-10-CM | POA: Diagnosis not present

## 2021-11-27 DIAGNOSIS — N250 Renal osteodystrophy: Secondary | ICD-10-CM | POA: Diagnosis not present

## 2021-11-27 DIAGNOSIS — Z992 Dependence on renal dialysis: Secondary | ICD-10-CM | POA: Diagnosis not present

## 2021-11-27 DIAGNOSIS — N186 End stage renal disease: Secondary | ICD-10-CM | POA: Diagnosis not present

## 2021-11-30 DIAGNOSIS — N250 Renal osteodystrophy: Secondary | ICD-10-CM | POA: Diagnosis not present

## 2021-11-30 DIAGNOSIS — Z992 Dependence on renal dialysis: Secondary | ICD-10-CM | POA: Diagnosis not present

## 2021-11-30 DIAGNOSIS — D509 Iron deficiency anemia, unspecified: Secondary | ICD-10-CM | POA: Diagnosis not present

## 2021-11-30 DIAGNOSIS — N186 End stage renal disease: Secondary | ICD-10-CM | POA: Diagnosis not present

## 2021-12-01 DIAGNOSIS — E119 Type 2 diabetes mellitus without complications: Secondary | ICD-10-CM | POA: Diagnosis not present

## 2021-12-01 DIAGNOSIS — I351 Nonrheumatic aortic (valve) insufficiency: Secondary | ICD-10-CM | POA: Diagnosis not present

## 2021-12-01 DIAGNOSIS — I10 Essential (primary) hypertension: Secondary | ICD-10-CM | POA: Diagnosis not present

## 2021-12-01 DIAGNOSIS — I509 Heart failure, unspecified: Secondary | ICD-10-CM | POA: Diagnosis not present

## 2021-12-01 DIAGNOSIS — I4891 Unspecified atrial fibrillation: Secondary | ICD-10-CM | POA: Diagnosis not present

## 2021-12-01 DIAGNOSIS — I25810 Atherosclerosis of coronary artery bypass graft(s) without angina pectoris: Secondary | ICD-10-CM | POA: Diagnosis not present

## 2021-12-01 DIAGNOSIS — E785 Hyperlipidemia, unspecified: Secondary | ICD-10-CM | POA: Diagnosis not present

## 2021-12-01 DIAGNOSIS — I340 Nonrheumatic mitral (valve) insufficiency: Secondary | ICD-10-CM | POA: Diagnosis not present

## 2021-12-01 DIAGNOSIS — Z9861 Coronary angioplasty status: Secondary | ICD-10-CM | POA: Diagnosis not present

## 2021-12-01 DIAGNOSIS — I2510 Atherosclerotic heart disease of native coronary artery without angina pectoris: Secondary | ICD-10-CM | POA: Diagnosis not present

## 2021-12-02 DIAGNOSIS — Z992 Dependence on renal dialysis: Secondary | ICD-10-CM | POA: Diagnosis not present

## 2021-12-02 DIAGNOSIS — D509 Iron deficiency anemia, unspecified: Secondary | ICD-10-CM | POA: Diagnosis not present

## 2021-12-02 DIAGNOSIS — N250 Renal osteodystrophy: Secondary | ICD-10-CM | POA: Diagnosis not present

## 2021-12-02 DIAGNOSIS — N186 End stage renal disease: Secondary | ICD-10-CM | POA: Diagnosis not present

## 2021-12-03 DIAGNOSIS — E032 Hypothyroidism due to medicaments and other exogenous substances: Secondary | ICD-10-CM | POA: Diagnosis not present

## 2021-12-03 DIAGNOSIS — E1122 Type 2 diabetes mellitus with diabetic chronic kidney disease: Secondary | ICD-10-CM | POA: Diagnosis not present

## 2021-12-04 ENCOUNTER — Encounter (INDEPENDENT_AMBULATORY_CARE_PROVIDER_SITE_OTHER): Payer: Self-pay | Admitting: *Deleted

## 2021-12-04 DIAGNOSIS — N186 End stage renal disease: Secondary | ICD-10-CM | POA: Diagnosis not present

## 2021-12-04 DIAGNOSIS — D509 Iron deficiency anemia, unspecified: Secondary | ICD-10-CM | POA: Diagnosis not present

## 2021-12-04 DIAGNOSIS — N250 Renal osteodystrophy: Secondary | ICD-10-CM | POA: Diagnosis not present

## 2021-12-04 DIAGNOSIS — Z992 Dependence on renal dialysis: Secondary | ICD-10-CM | POA: Diagnosis not present

## 2021-12-07 DIAGNOSIS — N250 Renal osteodystrophy: Secondary | ICD-10-CM | POA: Diagnosis not present

## 2021-12-07 DIAGNOSIS — Z992 Dependence on renal dialysis: Secondary | ICD-10-CM | POA: Diagnosis not present

## 2021-12-07 DIAGNOSIS — N186 End stage renal disease: Secondary | ICD-10-CM | POA: Diagnosis not present

## 2021-12-07 DIAGNOSIS — D509 Iron deficiency anemia, unspecified: Secondary | ICD-10-CM | POA: Diagnosis not present

## 2021-12-08 DIAGNOSIS — I351 Nonrheumatic aortic (valve) insufficiency: Secondary | ICD-10-CM | POA: Diagnosis not present

## 2021-12-08 DIAGNOSIS — I2510 Atherosclerotic heart disease of native coronary artery without angina pectoris: Secondary | ICD-10-CM | POA: Diagnosis not present

## 2021-12-08 DIAGNOSIS — E1122 Type 2 diabetes mellitus with diabetic chronic kidney disease: Secondary | ICD-10-CM | POA: Diagnosis not present

## 2021-12-08 DIAGNOSIS — M545 Low back pain, unspecified: Secondary | ICD-10-CM | POA: Diagnosis not present

## 2021-12-08 DIAGNOSIS — I10 Essential (primary) hypertension: Secondary | ICD-10-CM | POA: Diagnosis not present

## 2021-12-08 DIAGNOSIS — E032 Hypothyroidism due to medicaments and other exogenous substances: Secondary | ICD-10-CM | POA: Diagnosis not present

## 2021-12-08 DIAGNOSIS — L89151 Pressure ulcer of sacral region, stage 1: Secondary | ICD-10-CM | POA: Diagnosis not present

## 2021-12-08 DIAGNOSIS — I498 Other specified cardiac arrhythmias: Secondary | ICD-10-CM | POA: Diagnosis not present

## 2021-12-08 DIAGNOSIS — E785 Hyperlipidemia, unspecified: Secondary | ICD-10-CM | POA: Diagnosis not present

## 2021-12-08 DIAGNOSIS — D649 Anemia, unspecified: Secondary | ICD-10-CM | POA: Diagnosis not present

## 2021-12-08 DIAGNOSIS — I4891 Unspecified atrial fibrillation: Secondary | ICD-10-CM | POA: Diagnosis not present

## 2021-12-08 DIAGNOSIS — G4700 Insomnia, unspecified: Secondary | ICD-10-CM | POA: Diagnosis not present

## 2021-12-09 DIAGNOSIS — N250 Renal osteodystrophy: Secondary | ICD-10-CM | POA: Diagnosis not present

## 2021-12-09 DIAGNOSIS — N186 End stage renal disease: Secondary | ICD-10-CM | POA: Diagnosis not present

## 2021-12-09 DIAGNOSIS — Z992 Dependence on renal dialysis: Secondary | ICD-10-CM | POA: Diagnosis not present

## 2021-12-09 DIAGNOSIS — D509 Iron deficiency anemia, unspecified: Secondary | ICD-10-CM | POA: Diagnosis not present

## 2021-12-11 DIAGNOSIS — Z992 Dependence on renal dialysis: Secondary | ICD-10-CM | POA: Diagnosis not present

## 2021-12-11 DIAGNOSIS — N2581 Secondary hyperparathyroidism of renal origin: Secondary | ICD-10-CM | POA: Diagnosis not present

## 2021-12-11 DIAGNOSIS — N186 End stage renal disease: Secondary | ICD-10-CM | POA: Diagnosis not present

## 2021-12-11 DIAGNOSIS — E1110 Type 2 diabetes mellitus with ketoacidosis without coma: Secondary | ICD-10-CM | POA: Diagnosis not present

## 2021-12-12 DIAGNOSIS — Z992 Dependence on renal dialysis: Secondary | ICD-10-CM | POA: Diagnosis not present

## 2021-12-12 DIAGNOSIS — N186 End stage renal disease: Secondary | ICD-10-CM | POA: Diagnosis not present

## 2021-12-12 DIAGNOSIS — E1110 Type 2 diabetes mellitus with ketoacidosis without coma: Secondary | ICD-10-CM | POA: Diagnosis not present

## 2021-12-13 DIAGNOSIS — Z992 Dependence on renal dialysis: Secondary | ICD-10-CM | POA: Diagnosis not present

## 2021-12-13 DIAGNOSIS — E1110 Type 2 diabetes mellitus with ketoacidosis without coma: Secondary | ICD-10-CM | POA: Diagnosis not present

## 2021-12-13 DIAGNOSIS — N186 End stage renal disease: Secondary | ICD-10-CM | POA: Diagnosis not present

## 2021-12-14 DIAGNOSIS — N186 End stage renal disease: Secondary | ICD-10-CM | POA: Diagnosis not present

## 2021-12-14 DIAGNOSIS — Z992 Dependence on renal dialysis: Secondary | ICD-10-CM | POA: Diagnosis not present

## 2021-12-14 DIAGNOSIS — N2581 Secondary hyperparathyroidism of renal origin: Secondary | ICD-10-CM | POA: Diagnosis not present

## 2021-12-14 DIAGNOSIS — E1110 Type 2 diabetes mellitus with ketoacidosis without coma: Secondary | ICD-10-CM | POA: Diagnosis not present

## 2021-12-15 DIAGNOSIS — N186 End stage renal disease: Secondary | ICD-10-CM | POA: Diagnosis not present

## 2021-12-15 DIAGNOSIS — E1110 Type 2 diabetes mellitus with ketoacidosis without coma: Secondary | ICD-10-CM | POA: Diagnosis not present

## 2021-12-15 DIAGNOSIS — Z992 Dependence on renal dialysis: Secondary | ICD-10-CM | POA: Diagnosis not present

## 2021-12-16 DIAGNOSIS — N186 End stage renal disease: Secondary | ICD-10-CM | POA: Diagnosis not present

## 2021-12-16 DIAGNOSIS — E1110 Type 2 diabetes mellitus with ketoacidosis without coma: Secondary | ICD-10-CM | POA: Diagnosis not present

## 2021-12-16 DIAGNOSIS — Z992 Dependence on renal dialysis: Secondary | ICD-10-CM | POA: Diagnosis not present

## 2021-12-16 DIAGNOSIS — N2581 Secondary hyperparathyroidism of renal origin: Secondary | ICD-10-CM | POA: Diagnosis not present

## 2021-12-17 DIAGNOSIS — E1110 Type 2 diabetes mellitus with ketoacidosis without coma: Secondary | ICD-10-CM | POA: Diagnosis not present

## 2021-12-17 DIAGNOSIS — Z992 Dependence on renal dialysis: Secondary | ICD-10-CM | POA: Diagnosis not present

## 2021-12-17 DIAGNOSIS — N186 End stage renal disease: Secondary | ICD-10-CM | POA: Diagnosis not present

## 2021-12-18 DIAGNOSIS — N186 End stage renal disease: Secondary | ICD-10-CM | POA: Diagnosis not present

## 2021-12-18 DIAGNOSIS — N2581 Secondary hyperparathyroidism of renal origin: Secondary | ICD-10-CM | POA: Diagnosis not present

## 2021-12-18 DIAGNOSIS — E1110 Type 2 diabetes mellitus with ketoacidosis without coma: Secondary | ICD-10-CM | POA: Diagnosis not present

## 2021-12-18 DIAGNOSIS — Z992 Dependence on renal dialysis: Secondary | ICD-10-CM | POA: Diagnosis not present

## 2021-12-19 DIAGNOSIS — E1110 Type 2 diabetes mellitus with ketoacidosis without coma: Secondary | ICD-10-CM | POA: Diagnosis not present

## 2021-12-19 DIAGNOSIS — Z992 Dependence on renal dialysis: Secondary | ICD-10-CM | POA: Diagnosis not present

## 2021-12-19 DIAGNOSIS — N186 End stage renal disease: Secondary | ICD-10-CM | POA: Diagnosis not present

## 2021-12-20 DIAGNOSIS — N186 End stage renal disease: Secondary | ICD-10-CM | POA: Diagnosis not present

## 2021-12-20 DIAGNOSIS — E1110 Type 2 diabetes mellitus with ketoacidosis without coma: Secondary | ICD-10-CM | POA: Diagnosis not present

## 2021-12-20 DIAGNOSIS — Z992 Dependence on renal dialysis: Secondary | ICD-10-CM | POA: Diagnosis not present

## 2021-12-21 DIAGNOSIS — N2581 Secondary hyperparathyroidism of renal origin: Secondary | ICD-10-CM | POA: Diagnosis not present

## 2021-12-21 DIAGNOSIS — E1110 Type 2 diabetes mellitus with ketoacidosis without coma: Secondary | ICD-10-CM | POA: Diagnosis not present

## 2021-12-21 DIAGNOSIS — N186 End stage renal disease: Secondary | ICD-10-CM | POA: Diagnosis not present

## 2021-12-21 DIAGNOSIS — Z992 Dependence on renal dialysis: Secondary | ICD-10-CM | POA: Diagnosis not present

## 2021-12-23 DIAGNOSIS — E1110 Type 2 diabetes mellitus with ketoacidosis without coma: Secondary | ICD-10-CM | POA: Diagnosis not present

## 2021-12-23 DIAGNOSIS — N2581 Secondary hyperparathyroidism of renal origin: Secondary | ICD-10-CM | POA: Diagnosis not present

## 2021-12-23 DIAGNOSIS — D631 Anemia in chronic kidney disease: Secondary | ICD-10-CM | POA: Diagnosis not present

## 2021-12-23 DIAGNOSIS — Z992 Dependence on renal dialysis: Secondary | ICD-10-CM | POA: Diagnosis not present

## 2021-12-23 DIAGNOSIS — N186 End stage renal disease: Secondary | ICD-10-CM | POA: Diagnosis not present

## 2021-12-24 DIAGNOSIS — Z992 Dependence on renal dialysis: Secondary | ICD-10-CM | POA: Diagnosis not present

## 2021-12-24 DIAGNOSIS — N186 End stage renal disease: Secondary | ICD-10-CM | POA: Diagnosis not present

## 2021-12-24 DIAGNOSIS — E1110 Type 2 diabetes mellitus with ketoacidosis without coma: Secondary | ICD-10-CM | POA: Diagnosis not present

## 2021-12-25 DIAGNOSIS — N2581 Secondary hyperparathyroidism of renal origin: Secondary | ICD-10-CM | POA: Diagnosis not present

## 2021-12-25 DIAGNOSIS — N186 End stage renal disease: Secondary | ICD-10-CM | POA: Diagnosis not present

## 2021-12-25 DIAGNOSIS — D631 Anemia in chronic kidney disease: Secondary | ICD-10-CM | POA: Diagnosis not present

## 2021-12-25 DIAGNOSIS — E1110 Type 2 diabetes mellitus with ketoacidosis without coma: Secondary | ICD-10-CM | POA: Diagnosis not present

## 2021-12-25 DIAGNOSIS — Z992 Dependence on renal dialysis: Secondary | ICD-10-CM | POA: Diagnosis not present

## 2021-12-28 DIAGNOSIS — N250 Renal osteodystrophy: Secondary | ICD-10-CM | POA: Diagnosis not present

## 2021-12-28 DIAGNOSIS — N186 End stage renal disease: Secondary | ICD-10-CM | POA: Diagnosis not present

## 2021-12-28 DIAGNOSIS — Z992 Dependence on renal dialysis: Secondary | ICD-10-CM | POA: Diagnosis not present

## 2021-12-28 DIAGNOSIS — D509 Iron deficiency anemia, unspecified: Secondary | ICD-10-CM | POA: Diagnosis not present

## 2021-12-30 DIAGNOSIS — N186 End stage renal disease: Secondary | ICD-10-CM | POA: Diagnosis not present

## 2021-12-30 DIAGNOSIS — Z992 Dependence on renal dialysis: Secondary | ICD-10-CM | POA: Diagnosis not present

## 2021-12-30 DIAGNOSIS — N250 Renal osteodystrophy: Secondary | ICD-10-CM | POA: Diagnosis not present

## 2021-12-30 DIAGNOSIS — D509 Iron deficiency anemia, unspecified: Secondary | ICD-10-CM | POA: Diagnosis not present

## 2022-01-01 DIAGNOSIS — N250 Renal osteodystrophy: Secondary | ICD-10-CM | POA: Diagnosis not present

## 2022-01-01 DIAGNOSIS — Z992 Dependence on renal dialysis: Secondary | ICD-10-CM | POA: Diagnosis not present

## 2022-01-01 DIAGNOSIS — N186 End stage renal disease: Secondary | ICD-10-CM | POA: Diagnosis not present

## 2022-01-01 DIAGNOSIS — D509 Iron deficiency anemia, unspecified: Secondary | ICD-10-CM | POA: Diagnosis not present

## 2022-01-04 DIAGNOSIS — N250 Renal osteodystrophy: Secondary | ICD-10-CM | POA: Diagnosis not present

## 2022-01-04 DIAGNOSIS — N186 End stage renal disease: Secondary | ICD-10-CM | POA: Diagnosis not present

## 2022-01-04 DIAGNOSIS — D509 Iron deficiency anemia, unspecified: Secondary | ICD-10-CM | POA: Diagnosis not present

## 2022-01-04 DIAGNOSIS — Z992 Dependence on renal dialysis: Secondary | ICD-10-CM | POA: Diagnosis not present

## 2022-01-06 DIAGNOSIS — D509 Iron deficiency anemia, unspecified: Secondary | ICD-10-CM | POA: Diagnosis not present

## 2022-01-06 DIAGNOSIS — Z992 Dependence on renal dialysis: Secondary | ICD-10-CM | POA: Diagnosis not present

## 2022-01-06 DIAGNOSIS — N250 Renal osteodystrophy: Secondary | ICD-10-CM | POA: Diagnosis not present

## 2022-01-06 DIAGNOSIS — N186 End stage renal disease: Secondary | ICD-10-CM | POA: Diagnosis not present

## 2022-01-08 DIAGNOSIS — N250 Renal osteodystrophy: Secondary | ICD-10-CM | POA: Diagnosis not present

## 2022-01-08 DIAGNOSIS — D509 Iron deficiency anemia, unspecified: Secondary | ICD-10-CM | POA: Diagnosis not present

## 2022-01-08 DIAGNOSIS — Z992 Dependence on renal dialysis: Secondary | ICD-10-CM | POA: Diagnosis not present

## 2022-01-08 DIAGNOSIS — N186 End stage renal disease: Secondary | ICD-10-CM | POA: Diagnosis not present

## 2022-01-12 DIAGNOSIS — Z992 Dependence on renal dialysis: Secondary | ICD-10-CM | POA: Diagnosis not present

## 2022-01-12 DIAGNOSIS — N186 End stage renal disease: Secondary | ICD-10-CM | POA: Diagnosis not present

## 2022-01-14 DIAGNOSIS — Z992 Dependence on renal dialysis: Secondary | ICD-10-CM | POA: Diagnosis not present

## 2022-01-14 DIAGNOSIS — N186 End stage renal disease: Secondary | ICD-10-CM | POA: Diagnosis not present

## 2022-01-18 DIAGNOSIS — N186 End stage renal disease: Secondary | ICD-10-CM | POA: Diagnosis not present

## 2022-01-18 DIAGNOSIS — Z992 Dependence on renal dialysis: Secondary | ICD-10-CM | POA: Diagnosis not present

## 2022-01-18 DIAGNOSIS — D509 Iron deficiency anemia, unspecified: Secondary | ICD-10-CM | POA: Diagnosis not present

## 2022-01-18 DIAGNOSIS — N250 Renal osteodystrophy: Secondary | ICD-10-CM | POA: Diagnosis not present

## 2022-01-20 DIAGNOSIS — Z992 Dependence on renal dialysis: Secondary | ICD-10-CM | POA: Diagnosis not present

## 2022-01-20 DIAGNOSIS — D509 Iron deficiency anemia, unspecified: Secondary | ICD-10-CM | POA: Diagnosis not present

## 2022-01-20 DIAGNOSIS — N186 End stage renal disease: Secondary | ICD-10-CM | POA: Diagnosis not present

## 2022-01-20 DIAGNOSIS — N250 Renal osteodystrophy: Secondary | ICD-10-CM | POA: Diagnosis not present

## 2022-01-21 DIAGNOSIS — Z992 Dependence on renal dialysis: Secondary | ICD-10-CM | POA: Diagnosis not present

## 2022-01-21 DIAGNOSIS — N186 End stage renal disease: Secondary | ICD-10-CM | POA: Diagnosis not present

## 2022-01-22 DIAGNOSIS — D509 Iron deficiency anemia, unspecified: Secondary | ICD-10-CM | POA: Diagnosis not present

## 2022-01-22 DIAGNOSIS — Z992 Dependence on renal dialysis: Secondary | ICD-10-CM | POA: Diagnosis not present

## 2022-01-22 DIAGNOSIS — N186 End stage renal disease: Secondary | ICD-10-CM | POA: Diagnosis not present

## 2022-01-22 DIAGNOSIS — N250 Renal osteodystrophy: Secondary | ICD-10-CM | POA: Diagnosis not present

## 2022-01-22 DIAGNOSIS — D631 Anemia in chronic kidney disease: Secondary | ICD-10-CM | POA: Diagnosis not present

## 2022-01-25 DIAGNOSIS — N186 End stage renal disease: Secondary | ICD-10-CM | POA: Diagnosis not present

## 2022-01-25 DIAGNOSIS — D509 Iron deficiency anemia, unspecified: Secondary | ICD-10-CM | POA: Diagnosis not present

## 2022-01-25 DIAGNOSIS — Z992 Dependence on renal dialysis: Secondary | ICD-10-CM | POA: Diagnosis not present

## 2022-01-25 DIAGNOSIS — D631 Anemia in chronic kidney disease: Secondary | ICD-10-CM | POA: Diagnosis not present

## 2022-01-25 DIAGNOSIS — N250 Renal osteodystrophy: Secondary | ICD-10-CM | POA: Diagnosis not present

## 2022-01-27 DIAGNOSIS — D509 Iron deficiency anemia, unspecified: Secondary | ICD-10-CM | POA: Diagnosis not present

## 2022-01-27 DIAGNOSIS — N250 Renal osteodystrophy: Secondary | ICD-10-CM | POA: Diagnosis not present

## 2022-01-27 DIAGNOSIS — D631 Anemia in chronic kidney disease: Secondary | ICD-10-CM | POA: Diagnosis not present

## 2022-01-27 DIAGNOSIS — N186 End stage renal disease: Secondary | ICD-10-CM | POA: Diagnosis not present

## 2022-01-27 DIAGNOSIS — Z992 Dependence on renal dialysis: Secondary | ICD-10-CM | POA: Diagnosis not present

## 2022-01-29 DIAGNOSIS — D631 Anemia in chronic kidney disease: Secondary | ICD-10-CM | POA: Diagnosis not present

## 2022-01-29 DIAGNOSIS — N186 End stage renal disease: Secondary | ICD-10-CM | POA: Diagnosis not present

## 2022-01-29 DIAGNOSIS — D509 Iron deficiency anemia, unspecified: Secondary | ICD-10-CM | POA: Diagnosis not present

## 2022-01-29 DIAGNOSIS — Z992 Dependence on renal dialysis: Secondary | ICD-10-CM | POA: Diagnosis not present

## 2022-01-29 DIAGNOSIS — N250 Renal osteodystrophy: Secondary | ICD-10-CM | POA: Diagnosis not present

## 2022-02-01 ENCOUNTER — Emergency Department: Payer: Medicare Other

## 2022-02-01 ENCOUNTER — Emergency Department
Admission: EM | Admit: 2022-02-01 | Discharge: 2022-02-01 | Disposition: A | Discharge status: Home / Self Care | Payer: Medicare Other | Attending: Emergency Medicine | Admitting: Emergency Medicine

## 2022-02-01 DIAGNOSIS — I2510 Atherosclerotic heart disease of native coronary artery without angina pectoris: Secondary | ICD-10-CM | POA: Insufficient documentation

## 2022-02-01 DIAGNOSIS — W010XXA Fall on same level from slipping, tripping and stumbling without subsequent striking against object, initial encounter: Secondary | ICD-10-CM | POA: Insufficient documentation

## 2022-02-01 DIAGNOSIS — R9082 White matter disease, unspecified: Secondary | ICD-10-CM | POA: Diagnosis not present

## 2022-02-01 DIAGNOSIS — Z992 Dependence on renal dialysis: Secondary | ICD-10-CM | POA: Diagnosis not present

## 2022-02-01 DIAGNOSIS — S0990XA Unspecified injury of head, initial encounter: Secondary | ICD-10-CM | POA: Diagnosis not present

## 2022-02-01 DIAGNOSIS — S80212A Abrasion, left knee, initial encounter: Secondary | ICD-10-CM | POA: Insufficient documentation

## 2022-02-01 DIAGNOSIS — M4313 Spondylolisthesis, cervicothoracic region: Secondary | ICD-10-CM | POA: Diagnosis not present

## 2022-02-01 DIAGNOSIS — Z23 Encounter for immunization: Secondary | ICD-10-CM | POA: Insufficient documentation

## 2022-02-01 DIAGNOSIS — D509 Iron deficiency anemia, unspecified: Secondary | ICD-10-CM | POA: Diagnosis not present

## 2022-02-01 DIAGNOSIS — S4992XA Unspecified injury of left shoulder and upper arm, initial encounter: Secondary | ICD-10-CM

## 2022-02-01 DIAGNOSIS — N250 Renal osteodystrophy: Secondary | ICD-10-CM | POA: Diagnosis not present

## 2022-02-01 DIAGNOSIS — I509 Heart failure, unspecified: Secondary | ICD-10-CM | POA: Insufficient documentation

## 2022-02-01 DIAGNOSIS — M2578 Osteophyte, vertebrae: Secondary | ICD-10-CM | POA: Diagnosis not present

## 2022-02-01 DIAGNOSIS — M25512 Pain in left shoulder: Secondary | ICD-10-CM | POA: Diagnosis not present

## 2022-02-01 DIAGNOSIS — I4891 Unspecified atrial fibrillation: Secondary | ICD-10-CM | POA: Insufficient documentation

## 2022-02-01 DIAGNOSIS — N186 End stage renal disease: Secondary | ICD-10-CM | POA: Diagnosis not present

## 2022-02-01 DIAGNOSIS — I132 Hypertensive heart and chronic kidney disease with heart failure and with stage 5 chronic kidney disease, or end stage renal disease: Secondary | ICD-10-CM | POA: Diagnosis not present

## 2022-02-01 DIAGNOSIS — S199XXA Unspecified injury of neck, initial encounter: Secondary | ICD-10-CM | POA: Diagnosis not present

## 2022-02-01 DIAGNOSIS — S51812A Laceration without foreign body of left forearm, initial encounter: Secondary | ICD-10-CM | POA: Insufficient documentation

## 2022-02-01 DIAGNOSIS — D631 Anemia in chronic kidney disease: Secondary | ICD-10-CM | POA: Diagnosis not present

## 2022-02-01 DIAGNOSIS — E039 Hypothyroidism, unspecified: Secondary | ICD-10-CM | POA: Insufficient documentation

## 2022-02-01 DIAGNOSIS — Q046 Congenital cerebral cysts: Secondary | ICD-10-CM | POA: Diagnosis not present

## 2022-02-01 DIAGNOSIS — M47812 Spondylosis without myelopathy or radiculopathy, cervical region: Secondary | ICD-10-CM | POA: Insufficient documentation

## 2022-02-01 DIAGNOSIS — S59912A Unspecified injury of left forearm, initial encounter: Secondary | ICD-10-CM | POA: Diagnosis present

## 2022-02-01 DIAGNOSIS — I672 Cerebral atherosclerosis: Secondary | ICD-10-CM | POA: Diagnosis not present

## 2022-02-01 DIAGNOSIS — W19XXXA Unspecified fall, initial encounter: Secondary | ICD-10-CM

## 2022-02-01 LAB — CBC WITH DIFFERENTIAL/PLATELET
Abs Immature Granulocytes: 0.02 10*3/uL (ref 0.00–0.07)
Basophils Absolute: 0.1 10*3/uL (ref 0.0–0.1)
Basophils Relative: 1 %
Eosinophils Absolute: 0.2 10*3/uL (ref 0.0–0.5)
Eosinophils Relative: 5 %
HCT: 35.1 % — ABNORMAL LOW (ref 39.0–52.0)
Hemoglobin: 11.3 g/dL — ABNORMAL LOW (ref 13.0–17.0)
Immature Granulocytes: 0 %
Lymphocytes Relative: 13 %
Lymphs Abs: 0.6 10*3/uL — ABNORMAL LOW (ref 0.7–4.0)
MCH: 30.7 pg (ref 26.0–34.0)
MCHC: 32.2 g/dL (ref 30.0–36.0)
MCV: 95.4 fL (ref 80.0–100.0)
Monocytes Absolute: 0.5 10*3/uL (ref 0.1–1.0)
Monocytes Relative: 11 %
Neutro Abs: 3.3 10*3/uL (ref 1.7–7.7)
Neutrophils Relative %: 70 %
Platelets: 115 10*3/uL — ABNORMAL LOW (ref 150–400)
RBC: 3.68 MIL/uL — ABNORMAL LOW (ref 4.22–5.81)
RDW: 14.3 % (ref 11.5–15.5)
WBC: 4.8 10*3/uL (ref 4.0–10.5)
nRBC: 0 % (ref 0.0–0.2)

## 2022-02-01 LAB — PROTIME-INR
INR: 1.1 (ref 0.8–1.2)
Prothrombin Time: 13.8 seconds (ref 11.4–15.2)

## 2022-02-01 LAB — BASIC METABOLIC PANEL
Anion gap: 12 (ref 5–15)
BUN: 33 mg/dL — ABNORMAL HIGH (ref 8–23)
CO2: 29 mmol/L (ref 22–32)
Calcium: 9.1 mg/dL (ref 8.9–10.3)
Chloride: 97 mmol/L — ABNORMAL LOW (ref 98–111)
Creatinine, Ser: 2.01 mg/dL — ABNORMAL HIGH (ref 0.61–1.24)
GFR, Estimated: 31 mL/min — ABNORMAL LOW (ref >60)
Glucose, Bld: 106 mg/dL — ABNORMAL HIGH (ref 70–99)
Potassium: 4.3 mmol/L (ref 3.5–5.1)
Sodium: 138 mmol/L (ref 135–145)

## 2022-02-01 MED ORDER — ACETAMINOPHEN 325 MG PO TABS
650.0000 mg | ORAL_TABLET | Freq: Once | ORAL | Status: AC
  Administered 2022-02-01: 650 mg via ORAL
  Filled 2022-02-01: qty 2

## 2022-02-01 MED ORDER — TETANUS-DIPHTH-ACELL PERTUSSIS 5-2.5-18.5 LF-MCG/0.5 IM SUSY
0.5000 mL | PREFILLED_SYRINGE | Freq: Once | INTRAMUSCULAR | Status: AC
Start: 2022-02-01 — End: 2022-02-01
  Administered 2022-02-01: 0.5 mL via INTRAMUSCULAR
  Filled 2022-02-01: qty 0.5

## 2022-02-01 NOTE — ED Triage Notes (Signed)
Pt had a mechanical fall tripped over curb

## 2022-02-01 NOTE — Discharge Instructions (Addendum)
Your CT scans and x-ray and blood tests were normal today.  You were given a sling, but remember to take your arm out several times per day to perform gentle range of motion exercises so you do not get frozen shoulder as we discussed.  You may continue to take Tylenol 650 mg every 6-8 hours to help with your pain.  Please follow-up with your outpatient provider.  Please return for any new, worsening, or change in symptoms or other concerns.  It was a pleasure caring for you today.

## 2022-02-01 NOTE — ED Provider Notes (Signed)
[facility] Provider Note    Event Date/Time   First MD Initiated Contact with Patient 02/01/22 2003     (approximate)   History   Fall   HPI  Matthew [name] is a 88 y.o. male with a past medical history of end-stage renal disease, heart failure, CKD, atrial fibrillation, hyperlipidemia who presents today after mechanical fall.  Patient reports that he tripped over the curb.  He reports that he fell to his left side.  Patient presents with his daughter who witnessed this.  No reports that this occurred at 6 PM this evening.  Patient did not strike his head or lose consciousness.  He was able to get up and has been ambulatory ever since.  He sustained skin tears to his left arm and is also complaining of left shoulder pain.  He has not had any vomiting.  He denies neck pain.  Patient Active Problem List   Diagnosis Date Noted   Age-related osteoporosis with current pathological fracture with routine healing 02/18/2021   Pressure injury of skin 02/15/2021   Pelvic fracture ([place]) 02/11/2021   ESRD (end stage renal disease) ([place])    Hypotension 12/26/2020   Tremor    Hypothyroidism    Chronic systolic CHF (congestive heart failure) ([place])    Fall 12/25/2020   Proximal humerus fracture 12/25/2020   Malposition of peritoneal dialysis catheter ([place]) 11/01/2020   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   Secondary hyperparathyroidism ([place]) 04/30/2018   Sepsis ([place]) 02/15/2018   Cardiomyopathy ([place]) 01/24/2018   Fluid overload 01/10/2018   Climacteric arthritis, lower leg 12/15/2017   Mitral regurgitation 12/15/2017   Hypoglycemia 12/09/2017   Osteoarthritis of knee 06/23/2016   Undiagnosed cardiac murmurs 09/04/2015   Aneurysm of thoracic aorta ([place]) 08/04/2015   Congestive heart failure ([place]) 08/04/2015   Postsurgical aortocoronary bypass status 08/04/2015   Chronic kidney disease 07/18/2015   GERD (gastroesophageal reflux disease) 07/18/2015   Aortic  root dilatation ([place]) 07/18/2015   Dissecting aortic aneurysm, 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[place]) 07/18/2015   Edema of both legs 07/18/2015   Shortness of breath 07/18/2015   Atrial fibrillation ([place]) 01/10/2014   Abnormal electrocardiogram 01/10/2014   Encounter for counseling 01/10/2014   Other general symptoms 01/10/2014   CAD (coronary artery disease) 12/04/2013   Unstable angina; Class III Angina 12/03/2013   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 ([place]) 12/03/2013   HLD (hyperlipidemia) 12/03/2013   Heart disease 12/03/2013   Disorder of kidney and ureter 05/04/2013   Gastrointestinal hemorrhage 05/02/2012   Allergic rhinitis due to pollen 01/06/2007   Reflux esophagitis 01/06/2007   Essential hypertension 10/06/2006   Overweight 10/06/2006          Physical Exam   Triage Vital Signs: ED Triage Vitals  Enc Vitals Group     BP --      Pulse Rate 02/01/22 1850 66     Resp 02/01/22 1850 17     Temp 02/01/22 1850 98.3 F (36.8 C)     Temp Source 02/01/22 1850 Oral     SpO2 02/01/22 1850 93 %     Weight 02/01/22 1851 220 lb 7.4 oz (100 kg)     Height 02/01/22 1851 6' (1.829 m)     Head Circumference --      Peak Flow --      Pain Score 02/01/22 1850 7     Pain Loc --      Pain Edu? --  Excl. in [place]? --     Most recent vital signs: Vitals:   02/01/22 1850 02/01/22 2147  BP:  (!) 119/56  Pulse: 66 67  Resp: 17 14  Temp: 98.3 F (36.8 C)   SpO2: 93% 97%    Physical Exam Vitals and nursing note reviewed.  Constitutional:      General: Awake and alert. No acute distress.    Appearance: Normal appearance. The patient is normal weight.  HENT:     Head: Normocephalic and atraumatic.     Mouth: Mucous membranes are moist.  Eyes:     General: PERRL. Normal EOMs        Right eye: No discharge.        Left eye: No discharge.     Conjunctiva/sclera: Conjunctivae normal.  Cardiovascular:     Rate and Rhythm: Normal rate and regular rhythm.     Pulses: Normal  pulses.  Pulmonary:     Effort: Pulmonary effort is normal. No respiratory distress.     Breath sounds: Normal breath sounds. No chest wall tenderness Abdominal:     Abdomen is soft. There is no abdominal tenderness. No rebound or guarding. No distention. Musculoskeletal:        General: No swelling. Normal range of motion.     Cervical back: Normal range of motion and neck supple.  No midline cervical spine tenderness.  Full range of motion of neck.  Negative Spurling test.  Negative Lhermitte sign.  Normal strength and sensation in bilateral upper extremities. Normal grip strength bilaterally.  Normal intrinsic muscle function of the hand bilaterally.  Normal radial pulses bilaterally. Pelvis stable.  Negative logroll of hip bilaterally.  Normal active and passive range of motion of bilateral hips, knees, ankles against resistance.  Superficial abrasion noted to left knee, nontender to palpation.  Extensor mechanism intact bilaterally Skin:    General: Skin is warm and dry.     Capillary Refill: Capillary refill takes less than 2 seconds.     Findings: No rash. Skin tears to left forearm. No active bleeding. Fistula in LUE with normal thrill Neurological:     Mental Status: The patient is awake and alert.  Neurological: GCS 15 alert and oriented x3 Normal speech, no expressive or receptive aphasia or dysarthria Cranial nerves II through XII intact Normal visual fields 5 out of 5 strength in all 4 extremities with intact sensation throughout No extremity drift Normal finger-to-nose testing, no limb or truncal ataxia      ED Results / Procedures / Treatments   Labs (all labs ordered are listed, but only abnormal results are displayed) Labs Reviewed  CBC WITH DIFFERENTIAL/PLATELET - Abnormal; Notable for the following components:      Result Value   RBC 3.68 (*)    Hemoglobin 11.3 (*)    HCT 35.1 (*)    Platelets 115 (*)    Lymphs Abs 0.6 (*)    All other components within  normal limits  BASIC METABOLIC PANEL - Abnormal; Notable for the following components:   Chloride 97 (*)    Glucose, Bld 106 (*)    BUN 33 (*)    Creatinine, Ser 2.01 (*)    GFR, Estimated 31 (*)    All other components within normal limits  PROTIME-INR     EKG     RADIOLOGY I independently reviewed and interpreted imaging and agree with radiologists findings.     PROCEDURES:  Critical Care performed:   Procedures   MEDICATIONS  ORDERED IN ED: Medications  acetaminophen (TYLENOL) tablet 650 mg (650 mg Oral Given 02/01/22 2058)  Tdap (BOOSTRIX) injection 0.5 mL (0.5 mLs Intramuscular Given 02/01/22 2058)     IMPRESSION / MDM / ASSESSMENT AND PLAN / ED COURSE  I reviewed the triage vital signs and the nursing notes.   Differential diagnosis includes, but is not limited to, skin tear, contusion, intracranial hemorrhage, cervical spine injury, shoulder dislocation, fracture.  Patient is awake and alert, hemodynamically stable and afebrile.  Patient presents with his daughter.  He is at his mental baseline.  He is alert and oriented x3.  Complains of pain to his left shoulder only.  Labs obtained in triage are overall reassuring.  His wounds were cleaned and bandaged.  CT head and neck obtained per [place] criteria and are normal.  X-ray of his shoulder also obtained which demonstrates no acute osseous injury.  He is able to range his shoulder.  No other joint pain or other injuries.  He denies pain elsewhere.  It does show evidence of remote humeral fracture, patient does admit to breaking his arm in the past.  He was offered a sling for comfort which he does want.  We discussed the risk of adhesive capsulitis if he does not remove his shoulder several times a day to perform gentle range of motion exercises.  Patient and family members understand and agree.  Patient's presentation is most consistent with acute complicated illness / injury requiring diagnostic  workup.    FINAL CLINICAL IMPRESSION(S) / ED DIAGNOSES   Final diagnoses:  Fall, initial encounter  Injury of left shoulder, initial encounter  Skin tear of left forearm without complication, initial encounter     Rx / DC Orders   ED Discharge Orders     None        Note:  This document was prepared using Dragon voice recognition software and may include unintentional dictation errors.   Emeline Gins 02/01/22 2228    Vanessa [place], MD 02/01/22 [phone]

## 2022-02-01 NOTE — ED Triage Notes (Signed)
Pt fell to left side , shoulder , no hit head, pt on thinners

## 2022-02-01 NOTE — ED Notes (Signed)
Writer cleansed wounds on left knee left forearm, and left upper arm and placed clean nonadherent dressings on each.

## 2022-02-03 DIAGNOSIS — N186 End stage renal disease: Secondary | ICD-10-CM | POA: Diagnosis not present

## 2022-02-03 DIAGNOSIS — Z992 Dependence on renal dialysis: Secondary | ICD-10-CM | POA: Diagnosis not present

## 2022-02-03 DIAGNOSIS — D631 Anemia in chronic kidney disease: Secondary | ICD-10-CM | POA: Diagnosis not present

## 2022-02-03 DIAGNOSIS — D509 Iron deficiency anemia, unspecified: Secondary | ICD-10-CM | POA: Diagnosis not present

## 2022-02-03 DIAGNOSIS — N250 Renal osteodystrophy: Secondary | ICD-10-CM | POA: Diagnosis not present

## 2022-02-05 DIAGNOSIS — N250 Renal osteodystrophy: Secondary | ICD-10-CM | POA: Diagnosis not present

## 2022-02-05 DIAGNOSIS — Z992 Dependence on renal dialysis: Secondary | ICD-10-CM | POA: Diagnosis not present

## 2022-02-05 DIAGNOSIS — N186 End stage renal disease: Secondary | ICD-10-CM | POA: Diagnosis not present

## 2022-02-05 DIAGNOSIS — D631 Anemia in chronic kidney disease: Secondary | ICD-10-CM | POA: Diagnosis not present

## 2022-02-05 DIAGNOSIS — D509 Iron deficiency anemia, unspecified: Secondary | ICD-10-CM | POA: Diagnosis not present

## 2022-02-08 DIAGNOSIS — N186 End stage renal disease: Secondary | ICD-10-CM | POA: Diagnosis not present

## 2022-02-08 DIAGNOSIS — D631 Anemia in chronic kidney disease: Secondary | ICD-10-CM | POA: Diagnosis not present

## 2022-02-08 DIAGNOSIS — D509 Iron deficiency anemia, unspecified: Secondary | ICD-10-CM | POA: Diagnosis not present

## 2022-02-08 DIAGNOSIS — N250 Renal osteodystrophy: Secondary | ICD-10-CM | POA: Diagnosis not present

## 2022-02-08 DIAGNOSIS — Z992 Dependence on renal dialysis: Secondary | ICD-10-CM | POA: Diagnosis not present

## 2022-02-11 ENCOUNTER — Other Ambulatory Visit (INDEPENDENT_AMBULATORY_CARE_PROVIDER_SITE_OTHER): Payer: Self-pay | Admitting: Nurse Practitioner

## 2022-02-11 ENCOUNTER — Ambulatory Visit (INDEPENDENT_AMBULATORY_CARE_PROVIDER_SITE_OTHER): Payer: Medicare Other

## 2022-02-11 ENCOUNTER — Ambulatory Visit (INDEPENDENT_AMBULATORY_CARE_PROVIDER_SITE_OTHER): Payer: Medicare Other | Admitting: Nurse Practitioner

## 2022-02-11 ENCOUNTER — Encounter (INDEPENDENT_AMBULATORY_CARE_PROVIDER_SITE_OTHER): Payer: Self-pay | Admitting: Nurse Practitioner

## 2022-02-11 DIAGNOSIS — T829XXS Unspecified complication of cardiac and vascular prosthetic device, implant and graft, sequela: Secondary | ICD-10-CM

## 2022-02-11 DIAGNOSIS — N186 End stage renal disease: Secondary | ICD-10-CM

## 2022-02-11 NOTE — Progress Notes (Signed)
Subjective:    Patient ID: Matthew [name], male    DOB: 10/16/1930, 88 y.o.   MRN: [id number] Chief Complaint  Patient presents with   Follow-up    Consult [place] whistling access left ue    The patient is an 88 year old male that presents today from his dialysis center with complaints of a whistling improvement in his AV access.  He denies any issues with dialysis.  He denies any issues with his sessions other than when they are instructed due to his hypotension.  He denies any extensive bleeding.  He recently had a fall on his left side but he does have significant swelling and bruising in his left arm and upper extremity.  Previous noninvasive studies show flow volume to be 990.  Today the flow volume is 578.  There is no area of significant stenosis but there is some narrowing at the venous anastomosis.    Review of Systems  Musculoskeletal:  Positive for gait problem.  All other systems reviewed and are negative.      Objective:   Physical Exam Vitals reviewed.  HENT:     Head: Normocephalic.  Cardiovascular:     Rate and Rhythm: Normal rate.     Pulses: Normal pulses.  Pulmonary:     Effort: Pulmonary effort is normal.  Skin:    General: Skin is warm and dry.  Neurological:     Mental Status: He is alert and oriented to person, place, and time.     Motor: Weakness present.     Gait: Gait abnormal.  Psychiatric:        Mood and Affect: Mood normal.        Behavior: Behavior normal.        Thought Content: Thought content normal.        Judgment: Judgment normal.     BP (!) 100/58 (BP Location: Right Arm)   Pulse 66   Resp 15   Wt 209 lb 12.8 oz (95.2 kg)   BMI 28.45 kg/m   Past Medical History:  Diagnosis Date   Anginal pain (HCC)    Arthritis    Ascending aortic aneurysm ([place]) 07/18/2015   a.) TTE 07/18/2015: severe dilation of ascending aorta measuring 7.0 cm. b.) TTE 01/10/2018: aortic root 3.8 cm; ascending aorta 6.8 cm; refused surgical  intervention/repair.   Atrial fibrillation ([place])    a.) CHA2DS2-VASc = 6 (age x 2, HFrEF, HTN, previous MI, T2DM). b.) rate/rhythm maintained on amiodarone + metoprolol succinate; chronic antiplatelet therapy using clopidogrel   Bradycardia    Broken arm 05/2013   Left   Cardiac murmur    a.) RIGHT upper sternal border   Cardiomyopathy [facility])    Coronary artery disease    DOE (dyspnea on exertion)    ESRD (end stage renal disease) (HCC)    GERD (gastroesophageal reflux disease)    HFrEF (heart failure with reduced ejection fraction) ([place])    a.) TTE 12/03/2013: mod dec LV function; EF 35-40%; severe inferolateral and inferior HK; LA dilated; G3DD; PASP 52 mmHg. b.) TTE 07/18/2015: mild LV dysfunction with mild LVH; EF 45%; mild BAE. c.) TTE 01/10/2018: mod LV dysfunction; EF 45%; diffuse HK   HLD (hyperlipidemia)    Hypertension    IDA (iron deficiency anemia)    Long term current use of antithrombotics/antiplatelets    a.) clopidogrel   Myocardial infarction ([place]) 2000   Peripheral edema    Pneumonia    S/P CABG x 4 2001  a.) LIMA-LAD, SVG-D1, SVG-OM1, SVG-OM2, SVG-PDA   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   left frontal scalp, EDC   Squamous cell carcinoma of skin 05/05/2020   left temporal scalp, in situ, EDC 05/13/20   T2DM (type 2 diabetes mellitus) ([place])    Valvular insufficiency 12/03/2013   a.) TTE 12/03/2013: EF 35-40%; mild AR/MR, b.) TTE 07/18/2015: EF 45%; mild AR/PR, mod TR, severe MR. c.) TTE 01/10/2018: mod AR/TR    Social History   Socioeconomic History   Marital status: Widowed    Spouse name: Not on file   Number of children: Not on file   Years of education: Not on file   Highest education level: Not on file  Occupational History   Not on file  Tobacco Use   Smoking status: Former    Types: Pipe, Cigarettes    Quit date: 12    Years since quitting: 31.7   Smokeless tobacco: Never  Vaping Use   Vaping Use: Never used  Substance and Sexual  Activity   Alcohol use: Not Currently    Alcohol/week: 1.0 standard drink of alcohol    Types: 1 Cans of beer per week    Comment: occasionally drinking only,once a month   Drug use: No   Sexual activity: Never  Other Topics Concern   Not on file  Social History Narrative   Lives with daughter and son in law   Social Determinants of Health   Financial Resource Strain: Low Risk  (01/10/2018)   Overall Financial Resource Strain (CARDIA)    Difficulty of Paying Living Expenses: Not hard at all  Food Insecurity: No [place] (01/10/2018)   Hunger Vital Sign    Worried About Running Out of Food in the Last Year: Never true    [place] in the Last Year: Never true  Transportation Needs: No Transportation Needs (01/10/2018)   PRAPARE - Hydrologist (Medical): No    Lack of Transportation (Non-Medical): No  Physical Activity: Inactive (01/10/2018)   Exercise Vital Sign    Days of Exercise per Week: 0 days    Minutes of Exercise per Session: 0 min  Stress: No Stress Concern Present (01/10/2018)   [place]    Feeling of Stress : Not at all  Social Connections: [place] (01/10/2018)   Social Connection and Isolation Panel [NHANES]    Frequency of Communication with Friends and Family: More than three times a week    Frequency of Social Gatherings with Friends and Family: More than three times a week    Attends Religious Services: More than 4 times per year    Active Member of [employer] or Organizations: Yes    Attends Music therapist: More than 4 times per year    Marital Status: Married  Human resources officer Violence: Not At Risk (01/10/2018)   Humiliation, Afraid, Rape, and Kick questionnaire    Fear of Current or Ex-Partner: No    Emotionally Abused: No    Physically Abused: No    Sexually Abused: No    Past Surgical History:  Procedure Laterality Date   AV  FISTULA PLACEMENT Left 04/15/2021   Procedure: INSERTION OF ARTERIOVENOUS (AV) GORE-TEX GRAFT ARM ( BRACHIAL AXILLARY);  Surgeon: Algernon Huxley, MD;  Location: [facility] ORS;  Service: Vascular;  Laterality: Left;   CATARACT EXTRACTION W/PHACO Left 07/26/2017   Procedure: CATARACT EXTRACTION PHACO AND INTRAOCULAR LENS PLACEMENT ([place]);  Surgeon: Birder Robson, MD;  Location: [facility];  Service: Ophthalmology;  Laterality: Left;  [place]   00:45.8 AP%  13.1 CDE  6.00 Fluid Pack Lot # [id number]   CATARACT EXTRACTION W/PHACO Right 08/17/2017   Procedure: CATARACT EXTRACTION PHACO AND INTRAOCULAR LENS PLACEMENT (IOC);  Surgeon: Birder Robson, MD;  Location: [facility];  Service: Ophthalmology;  Laterality: Right;  [place]  01:30 AP% 16.8 CDE 15.24 Fluid pack lot # [id number]   CHOLECYSTECTOMY     CORONARY ARTERY BYPASS GRAFT N/A 2001   Procedure: 5v CABG (LIMA-LAD, SVG-D1, SVG-OM1, SVG-OM2, SVG-PDA)   DIALYSIS/PERMA CATHETER INSERTION N/A 02/09/2018   Procedure: DIALYSIS/PERMA CATHETER INSERTION;  Surgeon: Algernon Huxley, MD;  Location: [place] [facility];  Service: Cardiovascular;  Laterality: N/A;   DIALYSIS/PERMA CATHETER REMOVAL N/A 06/25/2021   Procedure: DIALYSIS/PERMA CATHETER REMOVAL;  Surgeon: Algernon Huxley, MD;  Location: [place] [facility];  Service: Cardiovascular;  Laterality: N/A;   FRACTURE SURGERY Left 2015   LEFT arm   INSERTION OF DIALYSIS CATHETER Right 11/03/2020   Procedure: INSERTION OF Perm Cath in the [place];  Surgeon: Algernon Huxley, MD;  Location: [facility];  Service: Vascular;  Laterality: Right;   LEFT HEART CATHETERIZATION WITH CORONARY/GRAFT ANGIOGRAM Left 12/04/2013   Procedure: LEFT HEART CATHETERIZATION WITH Beatrix Fetters;  Surgeon: Leonie Man, MD;  Location: [facility] CATH LAB;  Service: Cardiovascular;  Laterality: Left;   LEFT HEART CATHETERIZATION WITH CORONARY/GRAFT ANGIOGRAM Left 02/06/2009   Procedure: LEFT HEART CATHETERIZATION WITH CORONARY/GRAFT  ANGIOGRAM; Location: [place]; Surgeon: Lowanda Foster, MD   PERCUTANEOUS CORONARY STENT INTERVENTION (PCI-S) N/A 12/06/2013   Procedure: STAGED PERCUTANEOUS CORONARY STENT INTERVENTION (overlapping 4.0 x 38 mm (mid) and 4.0 x 28 mm (ostial) Promus Primier DES to SVG-RPDA graft);  Surgeon: Sinclair Grooms, MD;  Location: [facility] CATH LAB;  Service: Cardiovascular   REMOVAL OF A DIALYSIS CATHETER N/A 11/03/2020   Procedure: REMOVAL OF A DIALYSIS CATHETER;  Surgeon: Algernon Huxley, MD;  Location: [facility];  Service: Vascular;  Laterality: N/A;    Family History  Problem Relation Age of Onset   Heart attack Father 63       died first MI at age 17   Heart failure Mother     Allergies  Allergen Reactions   Other     Other reaction(s): Myalgias (intolerance), Other (See Comments) Statin Drugs  Statin Drugs     Amoxicillin Diarrhea       Latest Ref Rng & Units 02/01/2022    6:54 PM 04/15/2021    6:52 AM 04/10/2021    2:34 PM  CBC  WBC 4.0 - 10.5 K/uL 4.8   5.8   Hemoglobin 13.0 - 17.0 g/dL 11.3  10.5  10.9   Hematocrit 39.0 - 52.0 % 35.1  31.0  32.7   Platelets 150 - 400 K/uL 115   169       CMP     Component Value Date/Time   NA 138 02/01/2022 1854   K 4.3 02/01/2022 1854   CL 97 (L) 02/01/2022 1854   CO2 29 02/01/2022 1854   GLUCOSE 106 (H) 02/01/2022 1854   BUN 33 (H) 02/01/2022 1854   CREATININE 2.01 (H) 02/01/2022 1854   CALCIUM 9.1 02/01/2022 1854   PROT 6.3 (L) 02/11/2021 0847   ALBUMIN 3.2 (L) 02/11/2021 0847   AST 33 02/11/2021 0847   ALT 27 02/11/2021 0847   ALKPHOS 149 (H) 02/11/2021 0847   BILITOT 1.0 02/11/2021 0847  GFRNONAA 31 (L) 02/01/2022 1854   GFRAA 25 (L) 05/26/2019 0503     No results found.     Assessment & Plan:   1. ESRD (end stage renal disease) ([place]) The patient does not have swelling but there is a slightly abnormal bruit.  His noninvasive studies today indicate that there is an increase in flow volume in his access but no significant  stenosis.  The patient has had a recent fall in that arm and with significant bruising and swelling.  Some of the swelling may be causing compression affecting his access.  He currently denies that there is any issues during dialysis.  Based on this we will allow him time to heal and we will reevaluate the wound to see if there is any noted improvement. - VAS US DUPLEX DIALYSIS ACCESS (AVF, AVG)   Current Outpatient Medications on File Prior to Visit  Medication Sig Dispense Refill   acyclovir ointment (ZOVIRAX) 5 % Apply 1 application topically daily as needed (shingles).     amiodarone (PACERONE) 200 MG tablet Take 200 mg by mouth daily.     bisacodyl (DULCOLAX) 10 MG suppository Place 1 suppository (10 mg total) rectally daily as needed for moderate constipation. 12 suppository 0   cholecalciferol (VITAMIN D) 25 MCG (1000 UNIT) tablet Take 1,000 Units by mouth daily with breakfast.     clopidogrel (PLAVIX) 75 MG tablet Take 1 tablet (75 mg total) by mouth daily with breakfast. 30 tablet 12   ezetimibe (ZETIA) 10 MG tablet Take 10 mg by mouth daily before breakfast.     ferrous sulfate 325 (65 FE) MG tablet Take 325 mg by mouth 2 (two) times daily with a meal.      fluticasone (FLONASE) 50 MCG/ACT nasal spray Place 2 sprays into both nostrils daily as needed for allergies or rhinitis.     furosemide (LASIX) 40 MG tablet Take 40 mg by mouth.     gabapentin (NEURONTIN) 300 MG capsule Take 1 capsule (300 mg total) by mouth at bedtime.     glipiZIDE (GLUCOTROL) 5 MG tablet Take 2.5 mg by mouth daily at 12 noon.     levocetirizine (XYZAL) 5 MG tablet Take 10 mg by mouth every morning.      levothyroxine (SYNTHROID) 75 MCG tablet Take 1 tablet (75 mcg total) by mouth daily before breakfast.     metoprolol succinate (TOPROL-XL) 25 MG 24 hr tablet Take 0.5 tablets (12.5 mg total) by mouth at bedtime. 30 tablet 0   midodrine (PROAMATINE) 5 MG tablet Take 1 tablet (5 mg total) by mouth 3 (three) times  daily with meals. (Patient taking differently: Take 5 mg by mouth 3 (three) times daily as needed.)     multivitamin (RENA-VIT) TABS tablet Take 1 tablet by mouth daily.     nitroGLYCERIN (NITROSTAT) 0.4 MG SL tablet Place 0.4 mg under the tongue every 5 (five) minutes as needed for chest pain.     Omega-3 Fatty Acids (FISH OIL) 1000 MG CAPS Take 4,000 mg by mouth daily with lunch.     polyethylene glycol (MIRALAX / GLYCOLAX) 17 g packet Take 17 g by mouth daily. 14 each 0   potassium chloride SA (KLOR-CON) 20 MEQ tablet Take 20 mEq by mouth daily.     rosuvastatin (CRESTOR) 40 MG tablet Take 40 mg by mouth at bedtime.     senna-docusate (SENOKOT-S) 8.6-50 MG tablet Take 2 tablets by mouth 2 (two) times daily. 30 tablet 0   HYDROcodone-acetaminophen (  NORCO/VICODIN) 5-325 MG tablet Take 2 tablets by mouth every 6 (six) hours as needed for moderate pain. (Patient not taking: Reported on 02/11/2022) 20 tablet 0   traMADol (ULTRAM) 50 MG tablet Take 1 tablet (50 mg total) by mouth every 6 (six) hours as needed for moderate pain. (Patient not taking: Reported on 02/11/2022) 10 tablet 0   No current facility-administered medications on file prior to visit.    There are no Patient Instructions on file for this visit. No follow-ups on file.   Kris Hartmann, NP

## 2022-02-12 ENCOUNTER — Encounter (INDEPENDENT_AMBULATORY_CARE_PROVIDER_SITE_OTHER): Payer: Self-pay | Admitting: Nurse Practitioner

## 2022-02-12 DIAGNOSIS — I351 Nonrheumatic aortic (valve) insufficiency: Secondary | ICD-10-CM | POA: Diagnosis not present

## 2022-02-12 DIAGNOSIS — L89151 Pressure ulcer of sacral region, stage 1: Secondary | ICD-10-CM | POA: Diagnosis not present

## 2022-02-12 DIAGNOSIS — E785 Hyperlipidemia, unspecified: Secondary | ICD-10-CM | POA: Diagnosis not present

## 2022-02-12 DIAGNOSIS — N186 End stage renal disease: Secondary | ICD-10-CM | POA: Diagnosis not present

## 2022-02-12 DIAGNOSIS — E1122 Type 2 diabetes mellitus with diabetic chronic kidney disease: Secondary | ICD-10-CM | POA: Diagnosis not present

## 2022-02-12 DIAGNOSIS — E032 Hypothyroidism due to medicaments and other exogenous substances: Secondary | ICD-10-CM | POA: Diagnosis not present

## 2022-02-12 DIAGNOSIS — D631 Anemia in chronic kidney disease: Secondary | ICD-10-CM | POA: Diagnosis not present

## 2022-02-12 DIAGNOSIS — N250 Renal osteodystrophy: Secondary | ICD-10-CM | POA: Diagnosis not present

## 2022-02-12 DIAGNOSIS — G4700 Insomnia, unspecified: Secondary | ICD-10-CM | POA: Diagnosis not present

## 2022-02-12 DIAGNOSIS — I2510 Atherosclerotic heart disease of native coronary artery without angina pectoris: Secondary | ICD-10-CM | POA: Diagnosis not present

## 2022-02-12 DIAGNOSIS — I10 Essential (primary) hypertension: Secondary | ICD-10-CM | POA: Diagnosis not present

## 2022-02-12 DIAGNOSIS — M545 Low back pain, unspecified: Secondary | ICD-10-CM | POA: Diagnosis not present

## 2022-02-12 DIAGNOSIS — S41101D Unspecified open wound of right upper arm, subsequent encounter: Secondary | ICD-10-CM | POA: Diagnosis not present

## 2022-02-12 DIAGNOSIS — Z992 Dependence on renal dialysis: Secondary | ICD-10-CM | POA: Diagnosis not present

## 2022-02-12 DIAGNOSIS — D509 Iron deficiency anemia, unspecified: Secondary | ICD-10-CM | POA: Diagnosis not present

## 2022-02-12 DIAGNOSIS — D649 Anemia, unspecified: Secondary | ICD-10-CM | POA: Diagnosis not present

## 2022-02-15 DIAGNOSIS — D509 Iron deficiency anemia, unspecified: Secondary | ICD-10-CM | POA: Diagnosis not present

## 2022-02-15 DIAGNOSIS — N186 End stage renal disease: Secondary | ICD-10-CM | POA: Diagnosis not present

## 2022-02-15 DIAGNOSIS — N250 Renal osteodystrophy: Secondary | ICD-10-CM | POA: Diagnosis not present

## 2022-02-15 DIAGNOSIS — D631 Anemia in chronic kidney disease: Secondary | ICD-10-CM | POA: Diagnosis not present

## 2022-02-15 DIAGNOSIS — Z992 Dependence on renal dialysis: Secondary | ICD-10-CM | POA: Diagnosis not present

## 2022-02-16 DIAGNOSIS — K219 Gastro-esophageal reflux disease without esophagitis: Secondary | ICD-10-CM | POA: Diagnosis not present

## 2022-02-16 DIAGNOSIS — N186 End stage renal disease: Secondary | ICD-10-CM | POA: Diagnosis not present

## 2022-02-16 DIAGNOSIS — Z7984 Long term (current) use of oral hypoglycemic drugs: Secondary | ICD-10-CM | POA: Diagnosis not present

## 2022-02-16 DIAGNOSIS — Z940 Kidney transplant status: Secondary | ICD-10-CM | POA: Diagnosis not present

## 2022-02-16 DIAGNOSIS — Z79899 Other long term (current) drug therapy: Secondary | ICD-10-CM | POA: Diagnosis not present

## 2022-02-16 DIAGNOSIS — I4891 Unspecified atrial fibrillation: Secondary | ICD-10-CM | POA: Diagnosis not present

## 2022-02-16 DIAGNOSIS — L89151 Pressure ulcer of sacral region, stage 1: Secondary | ICD-10-CM | POA: Diagnosis not present

## 2022-02-16 DIAGNOSIS — I509 Heart failure, unspecified: Secondary | ICD-10-CM | POA: Diagnosis not present

## 2022-02-16 DIAGNOSIS — I429 Cardiomyopathy, unspecified: Secondary | ICD-10-CM | POA: Diagnosis not present

## 2022-02-16 DIAGNOSIS — I1311 Hypertensive heart and chronic kidney disease without heart failure, with stage 5 chronic kidney disease, or end stage renal disease: Secondary | ICD-10-CM | POA: Diagnosis not present

## 2022-02-16 DIAGNOSIS — I2510 Atherosclerotic heart disease of native coronary artery without angina pectoris: Secondary | ICD-10-CM | POA: Diagnosis not present

## 2022-02-16 DIAGNOSIS — S51812D Laceration without foreign body of left forearm, subsequent encounter: Secondary | ICD-10-CM | POA: Diagnosis not present

## 2022-02-16 DIAGNOSIS — E785 Hyperlipidemia, unspecified: Secondary | ICD-10-CM | POA: Diagnosis not present

## 2022-02-16 DIAGNOSIS — I080 Rheumatic disorders of both mitral and aortic valves: Secondary | ICD-10-CM | POA: Diagnosis not present

## 2022-02-16 DIAGNOSIS — E039 Hypothyroidism, unspecified: Secondary | ICD-10-CM | POA: Diagnosis not present

## 2022-02-16 DIAGNOSIS — D631 Anemia in chronic kidney disease: Secondary | ICD-10-CM | POA: Diagnosis not present

## 2022-02-16 DIAGNOSIS — B029 Zoster without complications: Secondary | ICD-10-CM | POA: Diagnosis not present

## 2022-02-16 DIAGNOSIS — W19XXXD Unspecified fall, subsequent encounter: Secondary | ICD-10-CM | POA: Diagnosis not present

## 2022-02-16 DIAGNOSIS — E669 Obesity, unspecified: Secondary | ICD-10-CM | POA: Diagnosis not present

## 2022-02-16 DIAGNOSIS — G4700 Insomnia, unspecified: Secondary | ICD-10-CM | POA: Diagnosis not present

## 2022-02-16 DIAGNOSIS — I252 Old myocardial infarction: Secondary | ICD-10-CM | POA: Diagnosis not present

## 2022-02-16 DIAGNOSIS — Z7902 Long term (current) use of antithrombotics/antiplatelets: Secondary | ICD-10-CM | POA: Diagnosis not present

## 2022-02-16 DIAGNOSIS — Z992 Dependence on renal dialysis: Secondary | ICD-10-CM | POA: Diagnosis not present

## 2022-02-16 DIAGNOSIS — E1122 Type 2 diabetes mellitus with diabetic chronic kidney disease: Secondary | ICD-10-CM | POA: Diagnosis not present

## 2022-02-16 DIAGNOSIS — M170 Bilateral primary osteoarthritis of knee: Secondary | ICD-10-CM | POA: Diagnosis not present

## 2022-02-17 DIAGNOSIS — D509 Iron deficiency anemia, unspecified: Secondary | ICD-10-CM | POA: Diagnosis not present

## 2022-02-17 DIAGNOSIS — Z992 Dependence on renal dialysis: Secondary | ICD-10-CM | POA: Diagnosis not present

## 2022-02-17 DIAGNOSIS — N250 Renal osteodystrophy: Secondary | ICD-10-CM | POA: Diagnosis not present

## 2022-02-17 DIAGNOSIS — D631 Anemia in chronic kidney disease: Secondary | ICD-10-CM | POA: Diagnosis not present

## 2022-02-17 DIAGNOSIS — N186 End stage renal disease: Secondary | ICD-10-CM | POA: Diagnosis not present

## 2022-02-19 DIAGNOSIS — D631 Anemia in chronic kidney disease: Secondary | ICD-10-CM | POA: Diagnosis not present

## 2022-02-19 DIAGNOSIS — N250 Renal osteodystrophy: Secondary | ICD-10-CM | POA: Diagnosis not present

## 2022-02-19 DIAGNOSIS — N186 End stage renal disease: Secondary | ICD-10-CM | POA: Diagnosis not present

## 2022-02-19 DIAGNOSIS — D509 Iron deficiency anemia, unspecified: Secondary | ICD-10-CM | POA: Diagnosis not present

## 2022-02-19 DIAGNOSIS — Z992 Dependence on renal dialysis: Secondary | ICD-10-CM | POA: Diagnosis not present

## 2022-02-20 DIAGNOSIS — N186 End stage renal disease: Secondary | ICD-10-CM | POA: Diagnosis not present

## 2022-02-20 DIAGNOSIS — Z992 Dependence on renal dialysis: Secondary | ICD-10-CM | POA: Diagnosis not present

## 2022-02-22 DIAGNOSIS — N250 Renal osteodystrophy: Secondary | ICD-10-CM | POA: Diagnosis not present

## 2022-02-22 DIAGNOSIS — Z23 Encounter for immunization: Secondary | ICD-10-CM | POA: Diagnosis not present

## 2022-02-22 DIAGNOSIS — Z992 Dependence on renal dialysis: Secondary | ICD-10-CM | POA: Diagnosis not present

## 2022-02-22 DIAGNOSIS — D509 Iron deficiency anemia, unspecified: Secondary | ICD-10-CM | POA: Diagnosis not present

## 2022-02-22 DIAGNOSIS — N186 End stage renal disease: Secondary | ICD-10-CM | POA: Diagnosis not present

## 2022-02-22 DIAGNOSIS — D631 Anemia in chronic kidney disease: Secondary | ICD-10-CM | POA: Diagnosis not present

## 2022-02-23 DIAGNOSIS — I509 Heart failure, unspecified: Secondary | ICD-10-CM | POA: Diagnosis not present

## 2022-02-23 DIAGNOSIS — I1311 Hypertensive heart and chronic kidney disease without heart failure, with stage 5 chronic kidney disease, or end stage renal disease: Secondary | ICD-10-CM | POA: Diagnosis not present

## 2022-02-23 DIAGNOSIS — W19XXXD Unspecified fall, subsequent encounter: Secondary | ICD-10-CM | POA: Diagnosis not present

## 2022-02-23 DIAGNOSIS — E1122 Type 2 diabetes mellitus with diabetic chronic kidney disease: Secondary | ICD-10-CM | POA: Diagnosis not present

## 2022-02-23 DIAGNOSIS — S51812D Laceration without foreign body of left forearm, subsequent encounter: Secondary | ICD-10-CM | POA: Diagnosis not present

## 2022-02-23 DIAGNOSIS — L89151 Pressure ulcer of sacral region, stage 1: Secondary | ICD-10-CM | POA: Diagnosis not present

## 2022-02-24 DIAGNOSIS — N250 Renal osteodystrophy: Secondary | ICD-10-CM | POA: Diagnosis not present

## 2022-02-24 DIAGNOSIS — D631 Anemia in chronic kidney disease: Secondary | ICD-10-CM | POA: Diagnosis not present

## 2022-02-24 DIAGNOSIS — Z23 Encounter for immunization: Secondary | ICD-10-CM | POA: Diagnosis not present

## 2022-02-24 DIAGNOSIS — D509 Iron deficiency anemia, unspecified: Secondary | ICD-10-CM | POA: Diagnosis not present

## 2022-02-24 DIAGNOSIS — N186 End stage renal disease: Secondary | ICD-10-CM | POA: Diagnosis not present

## 2022-02-24 DIAGNOSIS — Z992 Dependence on renal dialysis: Secondary | ICD-10-CM | POA: Diagnosis not present

## 2022-02-25 DIAGNOSIS — L89151 Pressure ulcer of sacral region, stage 1: Secondary | ICD-10-CM | POA: Diagnosis not present

## 2022-02-25 DIAGNOSIS — E1122 Type 2 diabetes mellitus with diabetic chronic kidney disease: Secondary | ICD-10-CM | POA: Diagnosis not present

## 2022-02-25 DIAGNOSIS — S51812D Laceration without foreign body of left forearm, subsequent encounter: Secondary | ICD-10-CM | POA: Diagnosis not present

## 2022-02-25 DIAGNOSIS — I509 Heart failure, unspecified: Secondary | ICD-10-CM | POA: Diagnosis not present

## 2022-02-25 DIAGNOSIS — W19XXXD Unspecified fall, subsequent encounter: Secondary | ICD-10-CM | POA: Diagnosis not present

## 2022-02-25 DIAGNOSIS — I1311 Hypertensive heart and chronic kidney disease without heart failure, with stage 5 chronic kidney disease, or end stage renal disease: Secondary | ICD-10-CM | POA: Diagnosis not present

## 2022-02-26 DIAGNOSIS — N250 Renal osteodystrophy: Secondary | ICD-10-CM | POA: Diagnosis not present

## 2022-02-26 DIAGNOSIS — Z23 Encounter for immunization: Secondary | ICD-10-CM | POA: Diagnosis not present

## 2022-02-26 DIAGNOSIS — Z992 Dependence on renal dialysis: Secondary | ICD-10-CM | POA: Diagnosis not present

## 2022-02-26 DIAGNOSIS — D509 Iron deficiency anemia, unspecified: Secondary | ICD-10-CM | POA: Diagnosis not present

## 2022-02-26 DIAGNOSIS — D631 Anemia in chronic kidney disease: Secondary | ICD-10-CM | POA: Diagnosis not present

## 2022-02-26 DIAGNOSIS — N186 End stage renal disease: Secondary | ICD-10-CM | POA: Diagnosis not present

## 2022-03-01 DIAGNOSIS — N250 Renal osteodystrophy: Secondary | ICD-10-CM | POA: Diagnosis not present

## 2022-03-01 DIAGNOSIS — Z992 Dependence on renal dialysis: Secondary | ICD-10-CM | POA: Diagnosis not present

## 2022-03-01 DIAGNOSIS — D631 Anemia in chronic kidney disease: Secondary | ICD-10-CM | POA: Diagnosis not present

## 2022-03-01 DIAGNOSIS — Z23 Encounter for immunization: Secondary | ICD-10-CM | POA: Diagnosis not present

## 2022-03-01 DIAGNOSIS — N186 End stage renal disease: Secondary | ICD-10-CM | POA: Diagnosis not present

## 2022-03-01 DIAGNOSIS — D509 Iron deficiency anemia, unspecified: Secondary | ICD-10-CM | POA: Diagnosis not present

## 2022-03-02 DIAGNOSIS — I1311 Hypertensive heart and chronic kidney disease without heart failure, with stage 5 chronic kidney disease, or end stage renal disease: Secondary | ICD-10-CM | POA: Diagnosis not present

## 2022-03-02 DIAGNOSIS — S51812D Laceration without foreign body of left forearm, subsequent encounter: Secondary | ICD-10-CM | POA: Diagnosis not present

## 2022-03-02 DIAGNOSIS — L89151 Pressure ulcer of sacral region, stage 1: Secondary | ICD-10-CM | POA: Diagnosis not present

## 2022-03-02 DIAGNOSIS — I509 Heart failure, unspecified: Secondary | ICD-10-CM | POA: Diagnosis not present

## 2022-03-02 DIAGNOSIS — W19XXXD Unspecified fall, subsequent encounter: Secondary | ICD-10-CM | POA: Diagnosis not present

## 2022-03-02 DIAGNOSIS — E1122 Type 2 diabetes mellitus with diabetic chronic kidney disease: Secondary | ICD-10-CM | POA: Diagnosis not present

## 2022-03-03 DIAGNOSIS — D631 Anemia in chronic kidney disease: Secondary | ICD-10-CM | POA: Diagnosis not present

## 2022-03-03 DIAGNOSIS — N186 End stage renal disease: Secondary | ICD-10-CM | POA: Diagnosis not present

## 2022-03-03 DIAGNOSIS — N250 Renal osteodystrophy: Secondary | ICD-10-CM | POA: Diagnosis not present

## 2022-03-03 DIAGNOSIS — D509 Iron deficiency anemia, unspecified: Secondary | ICD-10-CM | POA: Diagnosis not present

## 2022-03-03 DIAGNOSIS — Z992 Dependence on renal dialysis: Secondary | ICD-10-CM | POA: Diagnosis not present

## 2022-03-03 DIAGNOSIS — Z23 Encounter for immunization: Secondary | ICD-10-CM | POA: Diagnosis not present

## 2022-03-04 DIAGNOSIS — I509 Heart failure, unspecified: Secondary | ICD-10-CM | POA: Diagnosis not present

## 2022-03-04 DIAGNOSIS — I4891 Unspecified atrial fibrillation: Secondary | ICD-10-CM | POA: Diagnosis not present

## 2022-03-04 DIAGNOSIS — E1122 Type 2 diabetes mellitus with diabetic chronic kidney disease: Secondary | ICD-10-CM | POA: Diagnosis not present

## 2022-03-04 DIAGNOSIS — S51812D Laceration without foreign body of left forearm, subsequent encounter: Secondary | ICD-10-CM | POA: Diagnosis not present

## 2022-03-04 DIAGNOSIS — Z9861 Coronary angioplasty status: Secondary | ICD-10-CM | POA: Diagnosis not present

## 2022-03-04 DIAGNOSIS — E785 Hyperlipidemia, unspecified: Secondary | ICD-10-CM | POA: Diagnosis not present

## 2022-03-04 DIAGNOSIS — I10 Essential (primary) hypertension: Secondary | ICD-10-CM | POA: Diagnosis not present

## 2022-03-04 DIAGNOSIS — I340 Nonrheumatic mitral (valve) insufficiency: Secondary | ICD-10-CM | POA: Diagnosis not present

## 2022-03-04 DIAGNOSIS — I1311 Hypertensive heart and chronic kidney disease without heart failure, with stage 5 chronic kidney disease, or end stage renal disease: Secondary | ICD-10-CM | POA: Diagnosis not present

## 2022-03-04 DIAGNOSIS — I25810 Atherosclerosis of coronary artery bypass graft(s) without angina pectoris: Secondary | ICD-10-CM | POA: Diagnosis not present

## 2022-03-04 DIAGNOSIS — I351 Nonrheumatic aortic (valve) insufficiency: Secondary | ICD-10-CM | POA: Diagnosis not present

## 2022-03-04 DIAGNOSIS — L89151 Pressure ulcer of sacral region, stage 1: Secondary | ICD-10-CM | POA: Diagnosis not present

## 2022-03-04 DIAGNOSIS — E663 Overweight: Secondary | ICD-10-CM | POA: Diagnosis not present

## 2022-03-04 DIAGNOSIS — W19XXXD Unspecified fall, subsequent encounter: Secondary | ICD-10-CM | POA: Diagnosis not present

## 2022-03-04 DIAGNOSIS — E119 Type 2 diabetes mellitus without complications: Secondary | ICD-10-CM | POA: Diagnosis not present

## 2022-03-04 DIAGNOSIS — I2510 Atherosclerotic heart disease of native coronary artery without angina pectoris: Secondary | ICD-10-CM | POA: Diagnosis not present

## 2022-03-05 DIAGNOSIS — Z23 Encounter for immunization: Secondary | ICD-10-CM | POA: Diagnosis not present

## 2022-03-05 DIAGNOSIS — D509 Iron deficiency anemia, unspecified: Secondary | ICD-10-CM | POA: Diagnosis not present

## 2022-03-05 DIAGNOSIS — Z992 Dependence on renal dialysis: Secondary | ICD-10-CM | POA: Diagnosis not present

## 2022-03-05 DIAGNOSIS — N186 End stage renal disease: Secondary | ICD-10-CM | POA: Diagnosis not present

## 2022-03-05 DIAGNOSIS — D631 Anemia in chronic kidney disease: Secondary | ICD-10-CM | POA: Diagnosis not present

## 2022-03-05 DIAGNOSIS — N250 Renal osteodystrophy: Secondary | ICD-10-CM | POA: Diagnosis not present

## 2022-03-08 DIAGNOSIS — N186 End stage renal disease: Secondary | ICD-10-CM | POA: Diagnosis not present

## 2022-03-08 DIAGNOSIS — D509 Iron deficiency anemia, unspecified: Secondary | ICD-10-CM | POA: Diagnosis not present

## 2022-03-08 DIAGNOSIS — D631 Anemia in chronic kidney disease: Secondary | ICD-10-CM | POA: Diagnosis not present

## 2022-03-08 DIAGNOSIS — Z992 Dependence on renal dialysis: Secondary | ICD-10-CM | POA: Diagnosis not present

## 2022-03-08 DIAGNOSIS — N250 Renal osteodystrophy: Secondary | ICD-10-CM | POA: Diagnosis not present

## 2022-03-08 DIAGNOSIS — Z23 Encounter for immunization: Secondary | ICD-10-CM | POA: Diagnosis not present

## 2022-03-09 DIAGNOSIS — Z23 Encounter for immunization: Secondary | ICD-10-CM | POA: Diagnosis not present

## 2022-03-09 DIAGNOSIS — E1122 Type 2 diabetes mellitus with diabetic chronic kidney disease: Secondary | ICD-10-CM | POA: Diagnosis not present

## 2022-03-09 DIAGNOSIS — L89151 Pressure ulcer of sacral region, stage 1: Secondary | ICD-10-CM | POA: Diagnosis not present

## 2022-03-09 DIAGNOSIS — W19XXXD Unspecified fall, subsequent encounter: Secondary | ICD-10-CM | POA: Diagnosis not present

## 2022-03-09 DIAGNOSIS — I509 Heart failure, unspecified: Secondary | ICD-10-CM | POA: Diagnosis not present

## 2022-03-09 DIAGNOSIS — S51812D Laceration without foreign body of left forearm, subsequent encounter: Secondary | ICD-10-CM | POA: Diagnosis not present

## 2022-03-09 DIAGNOSIS — I1311 Hypertensive heart and chronic kidney disease without heart failure, with stage 5 chronic kidney disease, or end stage renal disease: Secondary | ICD-10-CM | POA: Diagnosis not present

## 2022-03-10 DIAGNOSIS — N186 End stage renal disease: Secondary | ICD-10-CM | POA: Diagnosis not present

## 2022-03-10 DIAGNOSIS — D631 Anemia in chronic kidney disease: Secondary | ICD-10-CM | POA: Diagnosis not present

## 2022-03-10 DIAGNOSIS — N250 Renal osteodystrophy: Secondary | ICD-10-CM | POA: Diagnosis not present

## 2022-03-10 DIAGNOSIS — Z992 Dependence on renal dialysis: Secondary | ICD-10-CM | POA: Diagnosis not present

## 2022-03-10 DIAGNOSIS — Z23 Encounter for immunization: Secondary | ICD-10-CM | POA: Diagnosis not present

## 2022-03-10 DIAGNOSIS — D509 Iron deficiency anemia, unspecified: Secondary | ICD-10-CM | POA: Diagnosis not present

## 2022-03-11 DIAGNOSIS — S51812D Laceration without foreign body of left forearm, subsequent encounter: Secondary | ICD-10-CM | POA: Diagnosis not present

## 2022-03-11 DIAGNOSIS — L89151 Pressure ulcer of sacral region, stage 1: Secondary | ICD-10-CM | POA: Diagnosis not present

## 2022-03-11 DIAGNOSIS — I959 Hypotension, unspecified: Secondary | ICD-10-CM | POA: Diagnosis not present

## 2022-03-11 DIAGNOSIS — I1311 Hypertensive heart and chronic kidney disease without heart failure, with stage 5 chronic kidney disease, or end stage renal disease: Secondary | ICD-10-CM | POA: Diagnosis not present

## 2022-03-11 DIAGNOSIS — E785 Hyperlipidemia, unspecified: Secondary | ICD-10-CM | POA: Diagnosis not present

## 2022-03-11 DIAGNOSIS — I10 Essential (primary) hypertension: Secondary | ICD-10-CM | POA: Diagnosis not present

## 2022-03-11 DIAGNOSIS — E669 Obesity, unspecified: Secondary | ICD-10-CM | POA: Diagnosis not present

## 2022-03-11 DIAGNOSIS — E032 Hypothyroidism due to medicaments and other exogenous substances: Secondary | ICD-10-CM | POA: Diagnosis not present

## 2022-03-11 DIAGNOSIS — W19XXXD Unspecified fall, subsequent encounter: Secondary | ICD-10-CM | POA: Diagnosis not present

## 2022-03-11 DIAGNOSIS — E1122 Type 2 diabetes mellitus with diabetic chronic kidney disease: Secondary | ICD-10-CM | POA: Diagnosis not present

## 2022-03-11 DIAGNOSIS — I509 Heart failure, unspecified: Secondary | ICD-10-CM | POA: Diagnosis not present

## 2022-03-12 DIAGNOSIS — Z23 Encounter for immunization: Secondary | ICD-10-CM | POA: Diagnosis not present

## 2022-03-12 DIAGNOSIS — D631 Anemia in chronic kidney disease: Secondary | ICD-10-CM | POA: Diagnosis not present

## 2022-03-12 DIAGNOSIS — N186 End stage renal disease: Secondary | ICD-10-CM | POA: Diagnosis not present

## 2022-03-12 DIAGNOSIS — Z992 Dependence on renal dialysis: Secondary | ICD-10-CM | POA: Diagnosis not present

## 2022-03-12 DIAGNOSIS — D509 Iron deficiency anemia, unspecified: Secondary | ICD-10-CM | POA: Diagnosis not present

## 2022-03-12 DIAGNOSIS — N250 Renal osteodystrophy: Secondary | ICD-10-CM | POA: Diagnosis not present

## 2022-03-15 ENCOUNTER — Other Ambulatory Visit (INDEPENDENT_AMBULATORY_CARE_PROVIDER_SITE_OTHER): Payer: Self-pay | Admitting: Nurse Practitioner

## 2022-03-15 DIAGNOSIS — N250 Renal osteodystrophy: Secondary | ICD-10-CM | POA: Diagnosis not present

## 2022-03-15 DIAGNOSIS — D509 Iron deficiency anemia, unspecified: Secondary | ICD-10-CM | POA: Diagnosis not present

## 2022-03-15 DIAGNOSIS — D631 Anemia in chronic kidney disease: Secondary | ICD-10-CM | POA: Diagnosis not present

## 2022-03-15 DIAGNOSIS — Z23 Encounter for immunization: Secondary | ICD-10-CM | POA: Diagnosis not present

## 2022-03-15 DIAGNOSIS — N186 End stage renal disease: Secondary | ICD-10-CM | POA: Diagnosis not present

## 2022-03-15 DIAGNOSIS — Z992 Dependence on renal dialysis: Secondary | ICD-10-CM | POA: Diagnosis not present

## 2022-03-16 ENCOUNTER — Ambulatory Visit (INDEPENDENT_AMBULATORY_CARE_PROVIDER_SITE_OTHER): Payer: Medicare Other | Admitting: Vascular Surgery

## 2022-03-16 ENCOUNTER — Ambulatory Visit (INDEPENDENT_AMBULATORY_CARE_PROVIDER_SITE_OTHER): Payer: Medicare Other

## 2022-03-16 ENCOUNTER — Encounter (INDEPENDENT_AMBULATORY_CARE_PROVIDER_SITE_OTHER): Payer: Self-pay | Admitting: Vascular Surgery

## 2022-03-16 VITALS — BP 120/52 | HR 59 | Resp 16 | Wt 210.0 lb

## 2022-03-16 DIAGNOSIS — I120 Hypertensive chronic kidney disease with stage 5 chronic kidney disease or end stage renal disease: Secondary | ICD-10-CM | POA: Diagnosis not present

## 2022-03-16 DIAGNOSIS — M545 Low back pain, unspecified: Secondary | ICD-10-CM | POA: Diagnosis not present

## 2022-03-16 DIAGNOSIS — E785 Hyperlipidemia, unspecified: Secondary | ICD-10-CM

## 2022-03-16 DIAGNOSIS — I351 Nonrheumatic aortic (valve) insufficiency: Secondary | ICD-10-CM | POA: Diagnosis not present

## 2022-03-16 DIAGNOSIS — Z992 Dependence on renal dialysis: Secondary | ICD-10-CM | POA: Diagnosis not present

## 2022-03-16 DIAGNOSIS — I1311 Hypertensive heart and chronic kidney disease without heart failure, with stage 5 chronic kidney disease, or end stage renal disease: Secondary | ICD-10-CM | POA: Diagnosis not present

## 2022-03-16 DIAGNOSIS — I2510 Atherosclerotic heart disease of native coronary artery without angina pectoris: Secondary | ICD-10-CM | POA: Diagnosis not present

## 2022-03-16 DIAGNOSIS — S51812D Laceration without foreign body of left forearm, subsequent encounter: Secondary | ICD-10-CM | POA: Diagnosis not present

## 2022-03-16 DIAGNOSIS — N186 End stage renal disease: Secondary | ICD-10-CM | POA: Diagnosis not present

## 2022-03-16 DIAGNOSIS — I10 Essential (primary) hypertension: Secondary | ICD-10-CM

## 2022-03-16 DIAGNOSIS — I509 Heart failure, unspecified: Secondary | ICD-10-CM | POA: Diagnosis not present

## 2022-03-16 DIAGNOSIS — E032 Hypothyroidism due to medicaments and other exogenous substances: Secondary | ICD-10-CM | POA: Diagnosis not present

## 2022-03-16 DIAGNOSIS — E1122 Type 2 diabetes mellitus with diabetic chronic kidney disease: Secondary | ICD-10-CM | POA: Diagnosis not present

## 2022-03-16 DIAGNOSIS — R7989 Other specified abnormal findings of blood chemistry: Secondary | ICD-10-CM | POA: Diagnosis not present

## 2022-03-16 DIAGNOSIS — L89151 Pressure ulcer of sacral region, stage 1: Secondary | ICD-10-CM | POA: Diagnosis not present

## 2022-03-16 DIAGNOSIS — G4700 Insomnia, unspecified: Secondary | ICD-10-CM | POA: Diagnosis not present

## 2022-03-16 DIAGNOSIS — W19XXXD Unspecified fall, subsequent encounter: Secondary | ICD-10-CM | POA: Diagnosis not present

## 2022-03-16 DIAGNOSIS — D649 Anemia, unspecified: Secondary | ICD-10-CM | POA: Diagnosis not present

## 2022-03-16 NOTE — Assessment & Plan Note (Signed)
blood pressure control important in reducing the progression of atherosclerotic disease. On appropriate oral medications.  

## 2022-03-16 NOTE — Assessment & Plan Note (Signed)
Duplex today shows stenosis at the venous anastomosis with relatively low flow through the AV graft.  Particular with prolonged bleeding this is a threatened graft and should have an intervention.  Risks and benefits of shuntogram with possible intervention were discussed with the patient and his son and they are agreeable to proceed.  This will be scheduled in the near future at his convenience.

## 2022-03-16 NOTE — H&P (View-Only) (Signed)
MRN : [id number]  Matthew [name] is a 88 y.o. (1930/09/05) male who presents with chief complaint of  Chief Complaint  Patient presents with   Follow-up    Ultrasound follow up  .  History of Present Illness: Patient returns today in follow up of his dialysis access.  He has been using his left arm AV graft for almost a year now.  He has some issues with prolonged bleeding but no difficulty with access and the flows have been fine.  Duplex today shows stenosis at the venous anastomosis with relatively low flow through the AV graft.  Current Outpatient Medications  Medication Sig Dispense Refill   acyclovir ointment (ZOVIRAX) 5 % Apply 1 application topically daily as needed (shingles).     amiodarone (PACERONE) 200 MG tablet Take 200 mg by mouth daily.     bisacodyl (DULCOLAX) 10 MG suppository Place 1 suppository (10 mg total) rectally daily as needed for moderate constipation. 12 suppository 0   cholecalciferol (VITAMIN D) 25 MCG (1000 UNIT) tablet Take 1,000 Units by mouth daily with breakfast.     clopidogrel (PLAVIX) 75 MG tablet Take 1 tablet (75 mg total) by mouth daily with breakfast. 30 tablet 12   ezetimibe (ZETIA) 10 MG tablet Take 10 mg by mouth daily before breakfast.     ferrous sulfate 325 (65 FE) MG tablet Take 325 mg by mouth 2 (two) times daily with a meal.      fluticasone (FLONASE) 50 MCG/ACT nasal spray Place 2 sprays into both nostrils daily as needed for allergies or rhinitis.     furosemide (LASIX) 40 MG tablet Take 40 mg by mouth.     gabapentin (NEURONTIN) 300 MG capsule Take 1 capsule (300 mg total) by mouth at bedtime.     glipiZIDE (GLUCOTROL) 5 MG tablet Take 2.5 mg by mouth daily at 12 noon.     levocetirizine (XYZAL) 5 MG tablet Take 10 mg by mouth every morning.      levothyroxine (SYNTHROID) 75 MCG tablet Take 1 tablet (75 mcg total) by mouth daily before breakfast.     metoprolol succinate (TOPROL-XL) 25 MG 24 hr tablet Take 0.5 tablets (12.5 mg total)  by mouth at bedtime. 30 tablet 0   midodrine (PROAMATINE) 5 MG tablet Take 1 tablet (5 mg total) by mouth 3 (three) times daily with meals. (Patient taking differently: Take 5 mg by mouth 3 (three) times daily as needed.)     multivitamin (RENA-VIT) TABS tablet Take 1 tablet by mouth daily.     nitroGLYCERIN (NITROSTAT) 0.4 MG SL tablet Place 0.4 mg under the tongue every 5 (five) minutes as needed for chest pain.     Omega-3 Fatty Acids (FISH OIL) 1000 MG CAPS Take 4,000 mg by mouth daily with lunch.     polyethylene glycol (MIRALAX / GLYCOLAX) 17 g packet Take 17 g by mouth daily. 14 each 0   potassium chloride SA (KLOR-CON) 20 MEQ tablet Take 20 mEq by mouth daily.     rosuvastatin (CRESTOR) 40 MG tablet Take 40 mg by mouth at bedtime.     senna-docusate (SENOKOT-S) 8.6-50 MG tablet Take 2 tablets by mouth 2 (two) times daily. 30 tablet 0   Suvorexant (BELSOMRA) 10 MG TABS Take 10 mg by mouth at bedtime.     HYDROcodone-acetaminophen (NORCO/VICODIN) 5-325 MG tablet Take 2 tablets by mouth every 6 (six) hours as needed for moderate pain. (Patient not taking: Reported on 02/11/2022) 20 tablet 0  traMADol (ULTRAM) 50 MG tablet Take 1 tablet (50 mg total) by mouth every 6 (six) hours as needed for moderate pain. (Patient not taking: Reported on 02/11/2022) 10 tablet 0   No current facility-administered medications for this visit.    Past Medical History:  Diagnosis Date   Anginal pain ([place])    Arthritis    Ascending aortic aneurysm ([place]) 07/18/2015   a.) TTE 07/18/2015: severe dilation of ascending aorta measuring 7.0 cm. b.) TTE 01/10/2018: aortic root 3.8 cm; ascending aorta 6.8 cm; refused surgical intervention/repair.   Atrial fibrillation ([place])    a.) CHA2DS2-VASc = 6 (age x 2, HFrEF, HTN, previous MI, T2DM). b.) rate/rhythm maintained on amiodarone + metoprolol succinate; chronic antiplatelet therapy using clopidogrel   Bradycardia    Broken arm 05/2013   Left   Cardiac murmur    a.)  RIGHT upper sternal border   Cardiomyopathy [facility])    Coronary artery disease    DOE (dyspnea on exertion)    ESRD (end stage renal disease) ([facility])    GERD (gastroesophageal reflux disease)    HFrEF (heart failure with reduced ejection fraction) ([place])    a.) TTE 12/03/2013: mod dec LV function; EF 35-40%; severe inferolateral and inferior HK; LA dilated; G3DD; PASP 52 mmHg. b.) TTE 07/18/2015: mild LV dysfunction with mild LVH; EF 45%; mild BAE. c.) TTE 01/10/2018: mod LV dysfunction; EF 45%; diffuse HK   HLD (hyperlipidemia)    Hypertension    IDA (iron deficiency anemia)    Long term current use of antithrombotics/antiplatelets    a.) clopidogrel   Myocardial infarction ([place]) 2000   Peripheral edema    Pneumonia    S/P CABG x 4 2001   a.) LIMA-LAD, SVG-D1, SVG-OM1, SVG-OM2, SVG-PDA   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   left frontal scalp, EDC   Squamous cell carcinoma of skin 05/05/2020   left temporal scalp, in situ, EDC 05/13/20   T2DM (type 2 diabetes mellitus) ([place])    Valvular insufficiency 12/03/2013   a.) TTE 12/03/2013: EF 35-40%; mild AR/MR, b.) TTE 07/18/2015: EF 45%; mild AR/PR, mod TR, severe MR. c.) TTE 01/10/2018: mod AR/TR    Past Surgical History:  Procedure Laterality Date   AV FISTULA PLACEMENT Left 04/15/2021   Procedure: INSERTION OF ARTERIOVENOUS (AV) GORE-TEX GRAFT ARM ( BRACHIAL AXILLARY);  Surgeon: Algernon Huxley, MD;  Location: [facility]S;  Service: Vascular;  Laterality: Left;   CATARACT EXTRACTION W/PHACO Left 07/26/2017   Procedure: CATARACT EXTRACTION PHACO AND INTRAOCULAR LENS PLACEMENT ([place]);  Surgeon: Birder Robson, MD;  Location: [facility];  Service: Ophthalmology;  Laterality: Left;  [place]   00:45.8 AP%  13.1 CDE  6.00 Fluid Pack Lot # [id number]   CATARACT EXTRACTION W/PHACO Right 08/17/2017   Procedure: CATARACT EXTRACTION PHACO AND INTRAOCULAR LENS PLACEMENT (IOC);  Surgeon: Birder Robson, MD;  Location: [facility];  Service: Ophthalmology;   Laterality: Right;  [place]  01:30 AP% 16.8 CDE 15.24 Fluid pack lot # [id number]   CHOLECYSTECTOMY     CORONARY ARTERY BYPASS GRAFT N/A 2001   Procedure: 5v CABG (LIMA-LAD, SVG-D1, SVG-OM1, SVG-OM2, SVG-PDA)   DIALYSIS/PERMA CATHETER INSERTION N/A 02/09/2018   Procedure: DIALYSIS/PERMA CATHETER INSERTION;  Surgeon: Algernon Huxley, MD;  Location: [place] [facility];  Service: Cardiovascular;  Laterality: N/A;   DIALYSIS/PERMA CATHETER REMOVAL N/A 06/25/2021   Procedure: DIALYSIS/PERMA CATHETER REMOVAL;  Surgeon: Algernon Huxley, MD;  Location: [place] [facility];  Service: Cardiovascular;  Laterality: N/A;   FRACTURE  SURGERY Left 2015   LEFT arm   INSERTION OF DIALYSIS CATHETER Right 11/03/2020   Procedure: INSERTION OF Perm Cath in the [place];  Surgeon: Algernon Huxley, MD;  Location: [facility];  Service: Vascular;  Laterality: Right;   LEFT HEART CATHETERIZATION WITH CORONARY/GRAFT ANGIOGRAM Left 12/04/2013   Procedure: LEFT HEART CATHETERIZATION WITH Beatrix Fetters;  Surgeon: Leonie Man, MD;  Location: [facility] CATH LAB;  Service: Cardiovascular;  Laterality: Left;   LEFT HEART CATHETERIZATION WITH CORONARY/GRAFT ANGIOGRAM Left 02/06/2009   Procedure: LEFT HEART CATHETERIZATION WITH CORONARY/GRAFT ANGIOGRAM; Location: [place]; Surgeon: Lowanda Foster, MD   PERCUTANEOUS CORONARY STENT INTERVENTION (PCI-S) N/A 12/06/2013   Procedure: STAGED PERCUTANEOUS CORONARY STENT INTERVENTION (overlapping 4.0 x 38 mm (mid) and 4.0 x 28 mm (ostial) Promus Primier DES to SVG-RPDA graft);  Surgeon: Sinclair Grooms, MD;  Location: [facility] CATH LAB;  Service: Cardiovascular   REMOVAL OF A DIALYSIS CATHETER N/A 11/03/2020   Procedure: REMOVAL OF A DIALYSIS CATHETER;  Surgeon: Algernon Huxley, MD;  Location: [facility];  Service: Vascular;  Laterality: N/A;     Social History   Tobacco Use   Smoking status: Former    Types: Pipe, Cigarettes    Quit date: 1992    Years since quitting: 31.8   Smokeless  tobacco: Never  Vaping Use   Vaping Use: Never used  Substance Use Topics   Alcohol use: Not Currently    Alcohol/week: 1.0 standard drink of alcohol    Types: 1 Cans of beer per week    Comment: occasionally drinking only,once a month   Drug use: No      Family History  Problem Relation Age of Onset   Heart attack Father 64       died first MI at age 75   Heart failure Mother   No bleeding or clotting disorders  Allergies  Allergen Reactions   Other     Other reaction(s): Myalgias (intolerance), Other (See Comments) Statin Drugs  Statin Drugs     Amoxicillin Diarrhea     REVIEW OF SYSTEMS (Negative unless checked)  Constitutional: '[]'$ Weight loss  '[]'$ Fever  '[]'$ Chills Cardiac: '[]'$ Chest pain   '[]'$ Chest pressure   '[x]'$ Palpitations   '[]'$ Shortness of breath when laying flat   '[]'$ Shortness of breath at rest   '[x]'$ Shortness of breath with exertion. Vascular:  '[]'$ Pain in legs with walking   '[]'$ Pain in legs at rest   '[]'$ Pain in legs when laying flat   '[]'$ Claudication   '[]'$ Pain in feet when walking  '[]'$ Pain in feet at rest  '[]'$ Pain in feet when laying flat   '[]'$ History of DVT   '[]'$ Phlebitis   '[]'$ Swelling in legs   '[]'$ Varicose veins   '[]'$ Non-healing ulcers Pulmonary:   '[]'$ Uses home oxygen   '[]'$ Productive cough   '[]'$ Hemoptysis   '[]'$ Wheeze  '[]'$ COPD   '[]'$ Asthma Neurologic:  '[]'$ Dizziness  '[]'$ Blackouts   '[]'$ Seizures   '[]'$ History of stroke   '[]'$ History of TIA  '[]'$ Aphasia   '[]'$ Temporary blindness   '[]'$ Dysphagia   '[]'$ Weakness or numbness in arms   '[]'$ Weakness or numbness in legs Musculoskeletal:  '[x]'$ Arthritis   '[]'$ Joint swelling   '[x]'$ Joint pain   '[]'$ Low back pain Hematologic:  '[]'$ Easy bruising  '[]'$ Easy bleeding   '[]'$ Hypercoagulable state   '[x]'$ Anemic   Gastrointestinal:  '[]'$ Blood in stool   '[]'$ Vomiting blood  '[]'$ Gastroesophageal reflux/heartburn   '[]'$ Abdominal pain Genitourinary:  '[x]'$ Chronic kidney disease   '[]'$ Difficult urination  '[]'$ Frequent urination  '[]'$ Burning with urination   '[]'$ Hematuria Skin:  '[]'$ Rashes   '[]'$   Ulcers    '[]'$ Wounds Psychological:  '[]'$ History of anxiety   '[]'$  History of major depression.  Physical Examination  BP (!) 120/52 (BP Location: Right Arm)   Pulse (!) 59   Resp 16   Wt 210 lb (95.3 kg)   BMI 28.48 kg/m  Gen:  WD/WN, NAD.  Appears younger than stated age Head: [place]/AT, No temporalis wasting. Ear/Nose/Throat: Hearing grossly intact, nares w/o erythema or drainage Eyes: Conjunctiva clear. Sclera non-icteric Neck: Supple.  Trachea midline Pulmonary:  Good air movement, no use of accessory muscles.  Cardiac: irregular Vascular:  Vessel Right Left  Radial Palpable Palpable           Musculoskeletal: M/S 5/5 throughout.  No deformity or atrophy.  Left upper arm AV graft somewhat pulsatile Neurologic: Sensation grossly intact in extremities.  Symmetrical.  Speech is fluent.  Psychiatric: Judgment intact, Mood & affect appropriate for pt's clinical situation. Dermatologic: No rashes or ulcers noted.  No cellulitis or open wounds.      Labs Recent Results (from the past 2160 hour(s))  CBC with Differential     Status: Abnormal   Collection Time: 02/01/22  6:54 PM  Result Value Ref Range   WBC 4.8 4.0 - 10.5 K/uL   RBC 3.68 (L) 4.22 - 5.81 MIL/uL   Hemoglobin 11.3 (L) 13.0 - 17.0 g/dL   HCT 35.1 (L) 39.0 - 52.0 %   MCV 95.4 80.0 - 100.0 fL   MCH 30.7 26.0 - 34.0 pg   MCHC 32.2 30.0 - 36.0 g/dL   RDW 14.3 11.5 - 15.5 %   Platelets 115 (L) 150 - 400 K/uL   nRBC 0.0 0.0 - 0.2 %   Neutrophils Relative % 70 %   Neutro Abs 3.3 1.7 - 7.7 K/uL   Lymphocytes Relative 13 %   Lymphs Abs 0.6 (L) 0.7 - 4.0 K/uL   Monocytes Relative 11 %   Monocytes Absolute 0.5 0.1 - 1.0 K/uL   Eosinophils Relative 5 %   Eosinophils Absolute 0.2 0.0 - 0.5 K/uL   Basophils Relative 1 %   Basophils Absolute 0.1 0.0 - 0.1 K/uL   Immature Granulocytes 0 %   Abs Immature Granulocytes 0.02 0.00 - 0.07 K/uL    Comment: Performed at [facility], [address] [place], [place] [address]   Basic metabolic panel     Status: Abnormal   Collection Time: 02/01/22  6:54 PM  Result Value Ref Range   Sodium 138 135 - 145 mmol/L   Potassium 4.3 3.5 - 5.1 mmol/L   Chloride 97 (L) 98 - 111 mmol/L   CO2 29 22 - 32 mmol/L   Glucose, Bld 106 (H) 70 - 99 mg/dL    Comment: Glucose reference range applies only to samples taken after fasting for at least 8 hours.   BUN 33 (H) 8 - 23 mg/dL   Creatinine, Ser 2.01 (H) 0.61 - 1.24 mg/dL   Calcium 9.1 8.9 - 10.3 mg/dL   GFR, Estimated 31 (L) >60 mL/min    Comment: (NOTE) Calculated using the CKD-EPI Creatinine Equation (2021)    Anion gap 12 5 - 15    Comment: Performed at [facility], [address] [place], [place] [address]  Protime-INR     Status: None   Collection Time: 02/01/22  6:54 PM  Result Value Ref Range   Prothrombin Time 13.8 11.4 - 15.2 seconds   INR 1.1 0.8 - 1.2    Comment: (NOTE) INR goal  varies based on device and disease states. Performed at [facility], [address] [place], [place] [address]     Radiology No results found.  Assessment/Plan  HLD (hyperlipidemia) lipid control important in reducing the progression of atherosclerotic disease. Continue statin therapy   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 ([place]) blood glucose control important in reducing the progression of atherosclerotic disease. Also, involved in wound healing. On appropriate medications.   Essential hypertension blood pressure control important in reducing the progression of atherosclerotic disease. On appropriate oral medications.   ESRD (end stage renal disease) ([place]) Duplex today shows stenosis at the venous anastomosis with relatively low flow through the AV graft.  Particular with prolonged bleeding this is a threatened graft and should have an intervention.  Risks and benefits of shuntogram with possible intervention were discussed with the patient and his son and they are agreeable to  proceed.  This will be scheduled in the near future at his convenience.    [name], MD  03/16/2022 11:38 AM    This note was created with Dragon medical transcription system.  Any errors from dictation are purely unintentional

## 2022-03-16 NOTE — Progress Notes (Signed)
MRN : [id number]  Matthew [name] is a 88 y.o. (1930-08-23) male who presents with chief complaint of  Chief Complaint  Patient presents with   Follow-up    Ultrasound follow up  .  History of Present Illness: Patient returns today in follow up of his dialysis access.  He has been using his left arm AV graft for almost a year now.  He has some issues with prolonged bleeding but no difficulty with access and the flows have been fine.  Duplex today shows stenosis at the venous anastomosis with relatively low flow through the AV graft.  Current Outpatient Medications  Medication Sig Dispense Refill   acyclovir ointment (ZOVIRAX) 5 % Apply 1 application topically daily as needed (shingles).     amiodarone (PACERONE) 200 MG tablet Take 200 mg by mouth daily.     bisacodyl (DULCOLAX) 10 MG suppository Place 1 suppository (10 mg total) rectally daily as needed for moderate constipation. 12 suppository 0   cholecalciferol (VITAMIN D) 25 MCG (1000 UNIT) tablet Take 1,000 Units by mouth daily with breakfast.     clopidogrel (PLAVIX) 75 MG tablet Take 1 tablet (75 mg total) by mouth daily with breakfast. 30 tablet 12   ezetimibe (ZETIA) 10 MG tablet Take 10 mg by mouth daily before breakfast.     ferrous sulfate 325 (65 FE) MG tablet Take 325 mg by mouth 2 (two) times daily with a meal.      fluticasone (FLONASE) 50 MCG/ACT nasal spray Place 2 sprays into both nostrils daily as needed for allergies or rhinitis.     furosemide (LASIX) 40 MG tablet Take 40 mg by mouth.     gabapentin (NEURONTIN) 300 MG capsule Take 1 capsule (300 mg total) by mouth at bedtime.     glipiZIDE (GLUCOTROL) 5 MG tablet Take 2.5 mg by mouth daily at 12 noon.     levocetirizine (XYZAL) 5 MG tablet Take 10 mg by mouth every morning.      levothyroxine (SYNTHROID) 75 MCG tablet Take 1 tablet (75 mcg total) by mouth daily before breakfast.     metoprolol succinate (TOPROL-XL) 25 MG 24 hr tablet Take 0.5 tablets (12.5 mg total)  by mouth at bedtime. 30 tablet 0   midodrine (PROAMATINE) 5 MG tablet Take 1 tablet (5 mg total) by mouth 3 (three) times daily with meals. (Patient taking differently: Take 5 mg by mouth 3 (three) times daily as needed.)     multivitamin (RENA-VIT) TABS tablet Take 1 tablet by mouth daily.     nitroGLYCERIN (NITROSTAT) 0.4 MG SL tablet Place 0.4 mg under the tongue every 5 (five) minutes as needed for chest pain.     Omega-3 Fatty Acids (FISH OIL) 1000 MG CAPS Take 4,000 mg by mouth daily with lunch.     polyethylene glycol (MIRALAX / GLYCOLAX) 17 g packet Take 17 g by mouth daily. 14 each 0   potassium chloride SA (KLOR-CON) 20 MEQ tablet Take 20 mEq by mouth daily.     rosuvastatin (CRESTOR) 40 MG tablet Take 40 mg by mouth at bedtime.     senna-docusate (SENOKOT-S) 8.6-50 MG tablet Take 2 tablets by mouth 2 (two) times daily. 30 tablet 0   Suvorexant (BELSOMRA) 10 MG TABS Take 10 mg by mouth at bedtime.     HYDROcodone-acetaminophen (NORCO/VICODIN) 5-325 MG tablet Take 2 tablets by mouth every 6 (six) hours as needed for moderate pain. (Patient not taking: Reported on 02/11/2022) 20 tablet 0  traMADol (ULTRAM) 50 MG tablet Take 1 tablet (50 mg total) by mouth every 6 (six) hours as needed for moderate pain. (Patient not taking: Reported on 02/11/2022) 10 tablet 0   No current facility-administered medications for this visit.    Past Medical History:  Diagnosis Date   Anginal pain ([place])    Arthritis    Ascending aortic aneurysm ([place]) 07/18/2015   a.) TTE 07/18/2015: severe dilation of ascending aorta measuring 7.0 cm. b.) TTE 01/10/2018: aortic root 3.8 cm; ascending aorta 6.8 cm; refused surgical intervention/repair.   Atrial fibrillation ([place])    a.) CHA2DS2-VASc = 6 (age x 2, HFrEF, HTN, previous MI, T2DM). b.) rate/rhythm maintained on amiodarone + metoprolol succinate; chronic antiplatelet therapy using clopidogrel   Bradycardia    Broken arm 05/2013   Left   Cardiac murmur    a.)  RIGHT upper sternal border   Cardiomyopathy [facility])    Coronary artery disease    DOE (dyspnea on exertion)    ESRD (end stage renal disease) ([facility])    GERD (gastroesophageal reflux disease)    HFrEF (heart failure with reduced ejection fraction) ([place])    a.) TTE 12/03/2013: mod dec LV function; EF 35-40%; severe inferolateral and inferior HK; LA dilated; G3DD; PASP 52 mmHg. b.) TTE 07/18/2015: mild LV dysfunction with mild LVH; EF 45%; mild BAE. c.) TTE 01/10/2018: mod LV dysfunction; EF 45%; diffuse HK   HLD (hyperlipidemia)    Hypertension    IDA (iron deficiency anemia)    Long term current use of antithrombotics/antiplatelets    a.) clopidogrel   Myocardial infarction ([place]) 2000   Peripheral edema    Pneumonia    S/P CABG x 4 2001   a.) LIMA-LAD, SVG-D1, SVG-OM1, SVG-OM2, SVG-PDA   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   left frontal scalp, EDC   Squamous cell carcinoma of skin 05/05/2020   left temporal scalp, in situ, EDC 05/13/20   T2DM (type 2 diabetes mellitus) ([place])    Valvular insufficiency 12/03/2013   a.) TTE 12/03/2013: EF 35-40%; mild AR/MR, b.) TTE 07/18/2015: EF 45%; mild AR/PR, mod TR, severe MR. c.) TTE 01/10/2018: mod AR/TR    Past Surgical History:  Procedure Laterality Date   AV FISTULA PLACEMENT Left 04/15/2021   Procedure: INSERTION OF ARTERIOVENOUS (AV) GORE-TEX GRAFT ARM ( BRACHIAL AXILLARY);  Surgeon: Algernon Huxley, MD;  Location: [facility] ORS;  Service: Vascular;  Laterality: Left;   CATARACT EXTRACTION W/PHACO Left 07/26/2017   Procedure: CATARACT EXTRACTION PHACO AND INTRAOCULAR LENS PLACEMENT ([employer]);  Surgeon: Birder Robson, MD;  Location: [facility] ORS;  Service: Ophthalmology;  Laterality: Left;  [place]   00:45.8 AP%  13.1 CDE  6.00 Fluid Pack Lot # [id number]   CATARACT EXTRACTION W/PHACO Right 08/17/2017   Procedure: CATARACT EXTRACTION PHACO AND INTRAOCULAR LENS PLACEMENT ([facility]);  Surgeon: Birder Robson, MD;  Location: [facility] ORS;  Service: Ophthalmology;   Laterality: Right;  [place]  01:30 AP% 16.8 CDE 15.24 Fluid pack lot # [id number]   CHOLECYSTECTOMY     CORONARY ARTERY BYPASS GRAFT N/A 2001   Procedure: 5v CABG (LIMA-LAD, SVG-D1, SVG-OM1, SVG-OM2, SVG-PDA)   DIALYSIS/PERMA CATHETER INSERTION N/A 02/09/2018   Procedure: DIALYSIS/PERMA CATHETER INSERTION;  Surgeon: Algernon Huxley, MD;  Location: [place] [facility];  Service: Cardiovascular;  Laterality: N/A;   DIALYSIS/PERMA CATHETER REMOVAL N/A 06/25/2021   Procedure: DIALYSIS/PERMA CATHETER REMOVAL;  Surgeon: Algernon Huxley, MD;  Location: [place] [facility];  Service: Cardiovascular;  Laterality: N/A;   FRACTURE  SURGERY Left 2015   LEFT arm   INSERTION OF DIALYSIS CATHETER Right 11/03/2020   Procedure: INSERTION OF Perm Cath in the [place];  Surgeon: Algernon Huxley, MD;  Location: [facility];  Service: Vascular;  Laterality: Right;   LEFT HEART CATHETERIZATION WITH CORONARY/GRAFT ANGIOGRAM Left 12/04/2013   Procedure: LEFT HEART CATHETERIZATION WITH Beatrix Fetters;  Surgeon: Leonie Man, MD;  Location: [facility] CATH LAB;  Service: Cardiovascular;  Laterality: Left;   LEFT HEART CATHETERIZATION WITH CORONARY/GRAFT ANGIOGRAM Left 02/06/2009   Procedure: LEFT HEART CATHETERIZATION WITH CORONARY/GRAFT ANGIOGRAM; Location: [place]; Surgeon: Lowanda Foster, MD   PERCUTANEOUS CORONARY STENT INTERVENTION (PCI-S) N/A 12/06/2013   Procedure: STAGED PERCUTANEOUS CORONARY STENT INTERVENTION (overlapping 4.0 x 38 mm (mid) and 4.0 x 28 mm (ostial) Promus Primier DES to SVG-RPDA graft);  Surgeon: Sinclair Grooms, MD;  Location: [facility] C[facility] LAB;  Service: Cardiovascular   REMOVAL OF A DIALYSIS CATHETER N/A 11/03/2020   Procedure: REMOVAL OF A DIALYSIS CATHETER;  Surgeon: Algernon Huxley, MD;  Location: [facility] ORS;  Service: Vascular;  Laterality: N/A;     Social History   Tobacco Use   Smoking status: Former    Types: Pipe, Cigarettes    Quit date: 1992    Years since quitting: 31.8   Smokeless  tobacco: Never  Vaping Use   Vaping Use: Never used  Substance Use Topics   Alcohol use: Not Currently    Alcohol/week: 1.0 standard drink of alcohol    Types: 1 Cans of beer per week    Comment: occasionally drinking only,once a month   Drug use: No      Family History  Problem Relation Age of Onset   Heart attack Father 98       died first MI at age 80   Heart failure Mother   No bleeding or clotting disorders  Allergies  Allergen Reactions   Other     Other reaction(s): Myalgias (intolerance), Other (See Comments) Statin Drugs  Statin Drugs     Amoxicillin Diarrhea     REVIEW OF SYSTEMS (Negative unless checked)  Constitutional: '[]'$ Weight loss  '[]'$ Fever  '[]'$ Chills Cardiac: '[]'$ Chest pain   '[]'$ Chest pressure   '[x]'$ Palpitations   '[]'$ Shortness of breath when laying flat   '[]'$ Shortness of breath at rest   '[x]'$ Shortness of breath with exertion. Vascular:  '[]'$ Pain in legs with walking   '[]'$ Pain in legs at rest   '[]'$ Pain in legs when laying flat   '[]'$ Claudication   '[]'$ Pain in feet when walking  '[]'$ Pain in feet at rest  '[]'$ Pain in feet when laying flat   '[]'$ History of DVT   '[]'$ Phlebitis   '[]'$ Swelling in legs   '[]'$ Varicose veins   '[]'$ Non-healing ulcers Pulmonary:   '[]'$ Uses home oxygen   '[]'$ Productive cough   '[]'$ Hemoptysis   '[]'$ Wheeze  '[]'$ COPD   '[]'$ Asthma Neurologic:  '[]'$ Dizziness  '[]'$ Blackouts   '[]'$ Seizures   '[]'$ History of stroke   '[]'$ History of TIA  '[]'$ Aphasia   '[]'$ Temporary blindness   '[]'$ Dysphagia   '[]'$ Weakness or numbness in arms   '[]'$ Weakness or numbness in legs Musculoskeletal:  '[x]'$ Arthritis   '[]'$ Joint swelling   '[x]'$ Joint pain   '[]'$ Low back pain Hematologic:  '[]'$ Easy bruising  '[]'$ Easy bleeding   '[]'$ Hypercoagulable state   '[x]'$ Anemic   Gastrointestinal:  '[]'$ Blood in stool   '[]'$ Vomiting blood  '[]'$ Gastroesophageal reflux/heartburn   '[]'$ Abdominal pain Genitourinary:  '[x]'$ Chronic kidney disease   '[]'$ Difficult urination  '[]'$ Frequent urination  '[]'$ Burning with urination   '[]'$ Hematuria Skin:  '[]'$ Rashes   '[]'$   Ulcers    '[]'$ Wounds Psychological:  '[]'$ History of anxiety   '[]'$  History of major depression.  Physical Examination  BP (!) 120/52 (BP Location: Right Arm)   Pulse (!) 59   Resp 16   Wt 210 lb (95.3 kg)   BMI 28.48 kg/m  Gen:  WD/WN, NAD.  Appears younger than stated age Head: [place]/AT, No temporalis wasting. Ear/Nose/Throat: Hearing grossly intact, nares w/o erythema or drainage Eyes: Conjunctiva clear. Sclera non-icteric Neck: Supple.  Trachea midline Pulmonary:  Good air movement, no use of accessory muscles.  Cardiac: irregular Vascular:  Vessel Right Left  Radial Palpable Palpable           Musculoskeletal: M/S 5/5 throughout.  No deformity or atrophy.  Left upper arm AV graft somewhat pulsatile Neurologic: Sensation grossly intact in extremities.  Symmetrical.  Speech is fluent.  Psychiatric: Judgment intact, Mood & affect appropriate for pt's clinical situation. Dermatologic: No rashes or ulcers noted.  No cellulitis or open wounds.      Labs Recent Results (from the past 2160 hour(s))  CBC with Differential     Status: Abnormal   Collection Time: 02/01/22  6:54 PM  Result Value Ref Range   WBC 4.8 4.0 - 10.5 K/uL   RBC 3.68 (L) 4.22 - 5.81 MIL/uL   Hemoglobin 11.3 (L) 13.0 - 17.0 g/dL   HCT 35.1 (L) 39.0 - 52.0 %   MCV 95.4 80.0 - 100.0 fL   MCH 30.7 26.0 - 34.0 pg   MCHC 32.2 30.0 - 36.0 g/dL   RDW 14.3 11.5 - 15.5 %   Platelets 115 (L) 150 - 400 K/uL   nRBC 0.0 0.0 - 0.2 %   Neutrophils Relative % 70 %   Neutro Abs 3.3 1.7 - 7.7 K/uL   Lymphocytes Relative 13 %   Lymphs Abs 0.6 (L) 0.7 - 4.0 K/uL   Monocytes Relative 11 %   Monocytes Absolute 0.5 0.1 - 1.0 K/uL   Eosinophils Relative 5 %   Eosinophils Absolute 0.2 0.0 - 0.5 K/uL   Basophils Relative 1 %   Basophils Absolute 0.1 0.0 - 0.1 K/uL   Immature Granulocytes 0 %   Abs Immature Granulocytes 0.02 0.00 - 0.07 K/uL    Comment: Performed at [facility], [address] [place], [place] [address]   Basic metabolic panel     Status: Abnormal   Collection Time: 02/01/22  6:54 PM  Result Value Ref Range   Sodium 138 135 - 145 mmol/L   Potassium 4.3 3.5 - 5.1 mmol/L   Chloride 97 (L) 98 - 111 mmol/L   CO2 29 22 - 32 mmol/L   Glucose, Bld 106 (H) 70 - 99 mg/dL    Comment: Glucose reference range applies only to samples taken after fasting for at least 8 hours.   BUN 33 (H) 8 - 23 mg/dL   Creatinine, Ser 2.01 (H) 0.61 - 1.24 mg/dL   Calcium 9.1 8.9 - 10.3 mg/dL   GFR, Estimated 31 (L) >60 mL/min    Comment: (NOTE) Calculated using the CKD-EPI Creatinine Equation (2021)    Anion gap 12 5 - 15    Comment: Performed at [facility], [address] [place], [place] [address]  Protime-INR     Status: None   Collection Time: 02/01/22  6:54 PM  Result Value Ref Range   Prothrombin Time 13.8 11.4 - 15.2 seconds   INR 1.1 0.8 - 1.2    Comment: (NOTE) INR goal  varies based on device and disease states. Performed at [facility], [address], [place], [place] [address]     Radiology No results found.  Assessment/Plan  HLD (hyperlipidemia) lipid control important in reducing the progression of atherosclerotic disease. Continue statin therapy   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 ([place]) blood glucose control important in reducing the progression of atherosclerotic disease. Also, involved in wound healing. On appropriate medications.   Essential hypertension blood pressure control important in reducing the progression of atherosclerotic disease. On appropriate oral medications.   ESRD (end stage renal disease) ([place]) Duplex today shows stenosis at the venous anastomosis with relatively low flow through the AV graft.  Particular with prolonged bleeding this is a threatened graft and should have an intervention.  Risks and benefits of shuntogram with possible intervention were discussed with the patient and his son and they are agreeable to  proceed.  This will be scheduled in the near future at his convenience.    [name], MD  03/16/2022 11:38 AM    This note was created with Dragon medical transcription system.  Any errors from dictation are purely unintentional

## 2022-03-16 NOTE — Assessment & Plan Note (Signed)
lipid control important in reducing the progression of atherosclerotic disease. Continue statin therapy  

## 2022-03-16 NOTE — Assessment & Plan Note (Signed)
blood glucose control important in reducing the progression of atherosclerotic disease. Also, involved in wound healing. On appropriate medications.  

## 2022-03-17 DIAGNOSIS — D631 Anemia in chronic kidney disease: Secondary | ICD-10-CM | POA: Diagnosis not present

## 2022-03-17 DIAGNOSIS — N250 Renal osteodystrophy: Secondary | ICD-10-CM | POA: Diagnosis not present

## 2022-03-17 DIAGNOSIS — Z23 Encounter for immunization: Secondary | ICD-10-CM | POA: Diagnosis not present

## 2022-03-17 DIAGNOSIS — Z992 Dependence on renal dialysis: Secondary | ICD-10-CM | POA: Diagnosis not present

## 2022-03-17 DIAGNOSIS — N186 End stage renal disease: Secondary | ICD-10-CM | POA: Diagnosis not present

## 2022-03-17 DIAGNOSIS — D509 Iron deficiency anemia, unspecified: Secondary | ICD-10-CM | POA: Diagnosis not present

## 2022-03-18 DIAGNOSIS — S51812D Laceration without foreign body of left forearm, subsequent encounter: Secondary | ICD-10-CM | POA: Diagnosis not present

## 2022-03-18 DIAGNOSIS — I252 Old myocardial infarction: Secondary | ICD-10-CM | POA: Diagnosis not present

## 2022-03-18 DIAGNOSIS — Z79899 Other long term (current) drug therapy: Secondary | ICD-10-CM | POA: Diagnosis not present

## 2022-03-18 DIAGNOSIS — E669 Obesity, unspecified: Secondary | ICD-10-CM | POA: Diagnosis not present

## 2022-03-18 DIAGNOSIS — M170 Bilateral primary osteoarthritis of knee: Secondary | ICD-10-CM | POA: Diagnosis not present

## 2022-03-18 DIAGNOSIS — Z992 Dependence on renal dialysis: Secondary | ICD-10-CM | POA: Diagnosis not present

## 2022-03-18 DIAGNOSIS — I429 Cardiomyopathy, unspecified: Secondary | ICD-10-CM | POA: Diagnosis not present

## 2022-03-18 DIAGNOSIS — I080 Rheumatic disorders of both mitral and aortic valves: Secondary | ICD-10-CM | POA: Diagnosis not present

## 2022-03-18 DIAGNOSIS — N186 End stage renal disease: Secondary | ICD-10-CM | POA: Diagnosis not present

## 2022-03-18 DIAGNOSIS — I2510 Atherosclerotic heart disease of native coronary artery without angina pectoris: Secondary | ICD-10-CM | POA: Diagnosis not present

## 2022-03-18 DIAGNOSIS — Z940 Kidney transplant status: Secondary | ICD-10-CM | POA: Diagnosis not present

## 2022-03-18 DIAGNOSIS — Z7902 Long term (current) use of antithrombotics/antiplatelets: Secondary | ICD-10-CM | POA: Diagnosis not present

## 2022-03-18 DIAGNOSIS — W19XXXD Unspecified fall, subsequent encounter: Secondary | ICD-10-CM | POA: Diagnosis not present

## 2022-03-18 DIAGNOSIS — I1311 Hypertensive heart and chronic kidney disease without heart failure, with stage 5 chronic kidney disease, or end stage renal disease: Secondary | ICD-10-CM | POA: Diagnosis not present

## 2022-03-18 DIAGNOSIS — K219 Gastro-esophageal reflux disease without esophagitis: Secondary | ICD-10-CM | POA: Diagnosis not present

## 2022-03-18 DIAGNOSIS — G4700 Insomnia, unspecified: Secondary | ICD-10-CM | POA: Diagnosis not present

## 2022-03-18 DIAGNOSIS — Z7984 Long term (current) use of oral hypoglycemic drugs: Secondary | ICD-10-CM | POA: Diagnosis not present

## 2022-03-18 DIAGNOSIS — L89151 Pressure ulcer of sacral region, stage 1: Secondary | ICD-10-CM | POA: Diagnosis not present

## 2022-03-18 DIAGNOSIS — E1122 Type 2 diabetes mellitus with diabetic chronic kidney disease: Secondary | ICD-10-CM | POA: Diagnosis not present

## 2022-03-18 DIAGNOSIS — B029 Zoster without complications: Secondary | ICD-10-CM | POA: Diagnosis not present

## 2022-03-18 DIAGNOSIS — D631 Anemia in chronic kidney disease: Secondary | ICD-10-CM | POA: Diagnosis not present

## 2022-03-18 DIAGNOSIS — E039 Hypothyroidism, unspecified: Secondary | ICD-10-CM | POA: Diagnosis not present

## 2022-03-18 DIAGNOSIS — I509 Heart failure, unspecified: Secondary | ICD-10-CM | POA: Diagnosis not present

## 2022-03-18 DIAGNOSIS — I4891 Unspecified atrial fibrillation: Secondary | ICD-10-CM | POA: Diagnosis not present

## 2022-03-18 DIAGNOSIS — E785 Hyperlipidemia, unspecified: Secondary | ICD-10-CM | POA: Diagnosis not present

## 2022-03-19 DIAGNOSIS — N186 End stage renal disease: Secondary | ICD-10-CM | POA: Diagnosis not present

## 2022-03-19 DIAGNOSIS — N250 Renal osteodystrophy: Secondary | ICD-10-CM | POA: Diagnosis not present

## 2022-03-19 DIAGNOSIS — Z23 Encounter for immunization: Secondary | ICD-10-CM | POA: Diagnosis not present

## 2022-03-19 DIAGNOSIS — Z992 Dependence on renal dialysis: Secondary | ICD-10-CM | POA: Diagnosis not present

## 2022-03-19 DIAGNOSIS — D631 Anemia in chronic kidney disease: Secondary | ICD-10-CM | POA: Diagnosis not present

## 2022-03-19 DIAGNOSIS — D509 Iron deficiency anemia, unspecified: Secondary | ICD-10-CM | POA: Diagnosis not present

## 2022-03-22 DIAGNOSIS — Z992 Dependence on renal dialysis: Secondary | ICD-10-CM | POA: Diagnosis not present

## 2022-03-22 DIAGNOSIS — N250 Renal osteodystrophy: Secondary | ICD-10-CM | POA: Diagnosis not present

## 2022-03-22 DIAGNOSIS — D631 Anemia in chronic kidney disease: Secondary | ICD-10-CM | POA: Diagnosis not present

## 2022-03-22 DIAGNOSIS — Z23 Encounter for immunization: Secondary | ICD-10-CM | POA: Diagnosis not present

## 2022-03-22 DIAGNOSIS — N186 End stage renal disease: Secondary | ICD-10-CM | POA: Diagnosis not present

## 2022-03-22 DIAGNOSIS — D509 Iron deficiency anemia, unspecified: Secondary | ICD-10-CM | POA: Diagnosis not present

## 2022-03-23 DIAGNOSIS — Z992 Dependence on renal dialysis: Secondary | ICD-10-CM | POA: Diagnosis not present

## 2022-03-23 DIAGNOSIS — N186 End stage renal disease: Secondary | ICD-10-CM | POA: Diagnosis not present

## 2022-03-24 ENCOUNTER — Telehealth (INDEPENDENT_AMBULATORY_CARE_PROVIDER_SITE_OTHER): Payer: Self-pay

## 2022-03-24 DIAGNOSIS — N186 End stage renal disease: Secondary | ICD-10-CM | POA: Diagnosis not present

## 2022-03-24 DIAGNOSIS — D631 Anemia in chronic kidney disease: Secondary | ICD-10-CM | POA: Diagnosis not present

## 2022-03-24 DIAGNOSIS — N250 Renal osteodystrophy: Secondary | ICD-10-CM | POA: Diagnosis not present

## 2022-03-24 DIAGNOSIS — D509 Iron deficiency anemia, unspecified: Secondary | ICD-10-CM | POA: Diagnosis not present

## 2022-03-24 DIAGNOSIS — Z992 Dependence on renal dialysis: Secondary | ICD-10-CM | POA: Diagnosis not present

## 2022-03-24 NOTE — Telephone Encounter (Signed)
Spoke with the patient's brother in law and the patient is scheduled Dr. Lucky Cowboy for a left arm shuntogram with a 8:30 am arrival time to the MM. Pre-procedure instructions were discussed and will be mailed.

## 2022-03-26 DIAGNOSIS — N186 End stage renal disease: Secondary | ICD-10-CM | POA: Diagnosis not present

## 2022-03-26 DIAGNOSIS — D631 Anemia in chronic kidney disease: Secondary | ICD-10-CM | POA: Diagnosis not present

## 2022-03-26 DIAGNOSIS — Z992 Dependence on renal dialysis: Secondary | ICD-10-CM | POA: Diagnosis not present

## 2022-03-26 DIAGNOSIS — D509 Iron deficiency anemia, unspecified: Secondary | ICD-10-CM | POA: Diagnosis not present

## 2022-03-26 DIAGNOSIS — N250 Renal osteodystrophy: Secondary | ICD-10-CM | POA: Diagnosis not present

## 2022-03-29 DIAGNOSIS — D631 Anemia in chronic kidney disease: Secondary | ICD-10-CM | POA: Diagnosis not present

## 2022-03-29 DIAGNOSIS — Z992 Dependence on renal dialysis: Secondary | ICD-10-CM | POA: Diagnosis not present

## 2022-03-29 DIAGNOSIS — N186 End stage renal disease: Secondary | ICD-10-CM | POA: Diagnosis not present

## 2022-03-29 DIAGNOSIS — N250 Renal osteodystrophy: Secondary | ICD-10-CM | POA: Diagnosis not present

## 2022-03-29 DIAGNOSIS — D509 Iron deficiency anemia, unspecified: Secondary | ICD-10-CM | POA: Diagnosis not present

## 2022-03-30 DIAGNOSIS — L89151 Pressure ulcer of sacral region, stage 1: Secondary | ICD-10-CM | POA: Diagnosis not present

## 2022-03-30 DIAGNOSIS — W19XXXD Unspecified fall, subsequent encounter: Secondary | ICD-10-CM | POA: Diagnosis not present

## 2022-03-30 DIAGNOSIS — S51812D Laceration without foreign body of left forearm, subsequent encounter: Secondary | ICD-10-CM | POA: Diagnosis not present

## 2022-03-30 DIAGNOSIS — E1122 Type 2 diabetes mellitus with diabetic chronic kidney disease: Secondary | ICD-10-CM | POA: Diagnosis not present

## 2022-03-30 DIAGNOSIS — I1311 Hypertensive heart and chronic kidney disease without heart failure, with stage 5 chronic kidney disease, or end stage renal disease: Secondary | ICD-10-CM | POA: Diagnosis not present

## 2022-03-30 DIAGNOSIS — I509 Heart failure, unspecified: Secondary | ICD-10-CM | POA: Diagnosis not present

## 2022-03-31 DIAGNOSIS — N186 End stage renal disease: Secondary | ICD-10-CM | POA: Diagnosis not present

## 2022-03-31 DIAGNOSIS — D631 Anemia in chronic kidney disease: Secondary | ICD-10-CM | POA: Diagnosis not present

## 2022-03-31 DIAGNOSIS — Z992 Dependence on renal dialysis: Secondary | ICD-10-CM | POA: Diagnosis not present

## 2022-03-31 DIAGNOSIS — N250 Renal osteodystrophy: Secondary | ICD-10-CM | POA: Diagnosis not present

## 2022-03-31 DIAGNOSIS — D509 Iron deficiency anemia, unspecified: Secondary | ICD-10-CM | POA: Diagnosis not present

## 2022-04-01 ENCOUNTER — Encounter
Admission: RE | Disposition: A | Discharge status: Home / Self Care | Payer: Self-pay | Source: Home / Self Care | Attending: Vascular Surgery

## 2022-04-01 ENCOUNTER — Encounter (INDEPENDENT_AMBULATORY_CARE_PROVIDER_SITE_OTHER): Payer: Self-pay | Admitting: Nurse Practitioner

## 2022-04-01 ENCOUNTER — Ambulatory Visit
Admission: RE | Admit: 2022-04-01 | Discharge: 2022-04-01 | Disposition: A | Discharge status: Home / Self Care | Payer: Medicare Other | Attending: Vascular Surgery | Admitting: Vascular Surgery

## 2022-04-01 ENCOUNTER — Encounter: Payer: Self-pay | Admitting: Vascular Surgery

## 2022-04-01 DIAGNOSIS — Z992 Dependence on renal dialysis: Secondary | ICD-10-CM

## 2022-04-01 DIAGNOSIS — Y841 Kidney dialysis as the cause of abnormal reaction of the patient, or of later complication, without mention of misadventure at the time of the procedure: Secondary | ICD-10-CM | POA: Diagnosis not present

## 2022-04-01 DIAGNOSIS — E1122 Type 2 diabetes mellitus with diabetic chronic kidney disease: Secondary | ICD-10-CM | POA: Diagnosis not present

## 2022-04-01 DIAGNOSIS — I120 Hypertensive chronic kidney disease with stage 5 chronic kidney disease or end stage renal disease: Secondary | ICD-10-CM | POA: Insufficient documentation

## 2022-04-01 DIAGNOSIS — N186 End stage renal disease: Secondary | ICD-10-CM | POA: Diagnosis not present

## 2022-04-01 DIAGNOSIS — Z7982 Long term (current) use of aspirin: Secondary | ICD-10-CM | POA: Insufficient documentation

## 2022-04-01 DIAGNOSIS — T82858A Stenosis of vascular prosthetic devices, implants and grafts, initial encounter: Secondary | ICD-10-CM

## 2022-04-01 DIAGNOSIS — E785 Hyperlipidemia, unspecified: Secondary | ICD-10-CM | POA: Diagnosis not present

## 2022-04-01 DIAGNOSIS — Z87891 Personal history of nicotine dependence: Secondary | ICD-10-CM | POA: Diagnosis not present

## 2022-04-01 HISTORY — PX: A/V SHUNTOGRAM: CATH118297

## 2022-04-01 LAB — POTASSIUM (ARMC VASCULAR LAB ONLY): Potassium (ARMC vascular lab): 4.5 mmol/L (ref 3.5–5.1)

## 2022-04-01 LAB — GLUCOSE, CAPILLARY
Glucose-Capillary: 123 mg/dL — ABNORMAL HIGH (ref 70–99)
Glucose-Capillary: 114 mg/dL — ABNORMAL HIGH (ref 70–99)

## 2022-04-01 SURGERY — A/V SHUNTOGRAM
Anesthesia: Moderate Sedation | Laterality: Left

## 2022-04-01 MED ORDER — CEFAZOLIN SODIUM-DEXTROSE 1-4 GM/50ML-% IV SOLN: 1.0000 g | INTRAVENOUS | Status: AC

## 2022-04-01 MED ORDER — HEPARIN SODIUM (PORCINE) 1000 UNIT/ML IJ SOLN
INTRAMUSCULAR | Status: DC | PRN
  Administered 2022-04-01: 3000 [IU] via INTRAVENOUS

## 2022-04-01 MED ORDER — IODIXANOL 320 MG/ML IV SOLN
INTRAVENOUS | Status: DC | PRN
  Administered 2022-04-01: 45 mL

## 2022-04-01 MED ORDER — DIPHENHYDRAMINE HCL 50 MG/ML IJ SOLN: 50.0000 mg | Freq: Once | INTRAMUSCULAR | Status: DC | PRN

## 2022-04-01 MED ORDER — FAMOTIDINE 20 MG PO TABS: 40.0000 mg | ORAL_TABLET | Freq: Once | ORAL | Status: DC | PRN

## 2022-04-01 MED ORDER — FENTANYL CITRATE (PF) 100 MCG/2ML IJ SOLN
INTRAMUSCULAR | Status: DC | PRN
  Administered 2022-04-01: 50 ug via INTRAVENOUS

## 2022-04-01 MED ORDER — CEFAZOLIN SODIUM-DEXTROSE 1-4 GM/50ML-% IV SOLN
INTRAVENOUS | Status: AC
  Administered 2022-04-01: 1 g via INTRAVENOUS
  Filled 2022-04-01: qty 50

## 2022-04-01 MED ORDER — MIDAZOLAM HCL 2 MG/2ML IJ SOLN
INTRAMUSCULAR | Status: DC | PRN
  Administered 2022-04-01: 1 mg via INTRAVENOUS

## 2022-04-01 MED ORDER — METHYLPREDNISOLONE SODIUM SUCC 125 MG IJ SOLR: 125.0000 mg | Freq: Once | INTRAMUSCULAR | Status: DC | PRN

## 2022-04-01 MED ORDER — HYDROMORPHONE HCL 1 MG/ML IJ SOLN: 1.0000 mg | Freq: Once | INTRAMUSCULAR | Status: DC | PRN

## 2022-04-01 MED ORDER — MIDAZOLAM HCL 2 MG/2ML IJ SOLN
INTRAMUSCULAR | Status: AC
  Filled 2022-04-01: qty 4

## 2022-04-01 MED ORDER — HEPARIN SODIUM (PORCINE) 1000 UNIT/ML IJ SOLN
INTRAMUSCULAR | Status: AC
  Filled 2022-04-01: qty 10

## 2022-04-01 MED ORDER — MIDAZOLAM HCL 2 MG/ML PO SYRP: 8.0000 mg | ORAL_SOLUTION | Freq: Once | ORAL | Status: DC | PRN

## 2022-04-01 MED ORDER — SODIUM CHLORIDE 0.9 % IV SOLN: INTRAVENOUS | Status: DC

## 2022-04-01 MED ORDER — FENTANYL CITRATE (PF) 100 MCG/2ML IJ SOLN
INTRAMUSCULAR | Status: AC
  Filled 2022-04-01: qty 2

## 2022-04-01 MED ORDER — ONDANSETRON HCL 4 MG/2ML IJ SOLN: 4.0000 mg | Freq: Four times a day (QID) | INTRAMUSCULAR | Status: DC | PRN

## 2022-04-01 SURGICAL SUPPLY — 17 items
BALLN DORADO 7X80X80 (BALLOONS) ×1
BALLN ULTRVRSE 5X100X75 (BALLOONS) ×1
BALLOON DORADO 7X80X80 (BALLOONS) IMPLANT
BALLOON ULTRVRSE 5X100X75 (BALLOONS) IMPLANT
CANNULA 5F STIFF (CANNULA) IMPLANT
CATH BEACON 5 .035 40 KMP TP (CATHETERS) IMPLANT
CATH BEACON 5 .038 40 KMP TP (CATHETERS) ×1
COVER PROBE ULTRASOUND 5X96 (MISCELLANEOUS) IMPLANT
DRAPE BRACHIAL (DRAPES) IMPLANT
GLIDEWIRE ADV .035X180CM (WIRE) IMPLANT
KIT ENCORE 26 ADVANTAGE (KITS) IMPLANT
PACK ANGIOGRAPHY (CUSTOM PROCEDURE TRAY) ×1 IMPLANT
SHEATH BRITE TIP 6FRX5.5 (SHEATH) IMPLANT
SHEATH BRITE TIP 7FRX5.5 (SHEATH) IMPLANT
STENT VIABAHN 8X7.5X120 (Permanent Stent) IMPLANT
SUT MNCRL AB 4-0 PS2 18 (SUTURE) IMPLANT
WIRE G 018X200 V18 (WIRE) IMPLANT

## 2022-04-01 NOTE — Interval H&P Note (Signed)
History and Physical Interval Note:  04/01/2022 9:36 AM  Matthew [name]  has presented today for surgery, with the diagnosis of L Arm Shuntogram    ESR.  The various methods of treatment have been discussed with the patient and family. After consideration of risks, benefits and other options for treatment, the patient has consented to  Procedure(s): A/V SHUNTOGRAM (Left) as a surgical intervention.  The patient's history has been reviewed, patient examined, no change in status, stable for surgery.  I have reviewed the patient's chart and labs.  Questions were answered to the patient's satisfaction.     [name]

## 2022-04-01 NOTE — Op Note (Signed)
[facility]    OPERATIVE NOTE   PROCEDURE: 1.  Left brachial artery to axillary vein arteriovenous graft cannulation under ultrasound guidance 2.  Left arm shuntogram 3.  Stent placement of the venous anastomosis and the axillary vein with an 8 mm diameter by 7.5 cm length Viabahn stent  PRE-OPERATIVE DIAGNOSIS: 1. ESRD 2. 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  Arterial anastomosis was patent as was the graft to the venous anastomosis and the central venous circulation was patent.  SPECIMEN(S):  None  CONTRAST: 45 cc  FLUORO TIME: 4.6 minutes  MODERATE CONSCIOUS SEDATION TIME:  Approximately 39 minutes using 1 mg of Versed and 50 mcg of Fentanyl  INDICATIONS: Matthew [name] is a 88 y.o. male who presents with malfunctioning left brachial artery to axillary vein arteriovenous graft.  The patient is scheduled for left arm shuntogram.  The patient is aware the risks include but are not limited to: bleeding, infection, thrombosis of the cannulated access, and possible anaphylactic reaction to the contrast.  The patient is aware of the risks of the procedure and elects to proceed forward.  DESCRIPTION: After full informed written consent was obtained, the patient was brought back to the angiography suite and placed supine upon the angiography table.  The patient was connected to monitoring equipment. Moderate conscious sedation was administered during a face to face encounter throughout the procedure with my supervision of the RN administering medicines and monitoring the patient's vital signs, pulse oximetry, telemetry and mental status throughout from the start of the  procedure until the patient was taken to the recovery room The left arm was prepped and draped in the standard fashion for a percutaneous access intervention.  Under ultrasound guidance, the left brachial artery to axillary vein arteriovenous graft was cannulated with a micropuncture needle under direct ultrasound guidance were it was patent and a permanent image was performed.  The microwire was advanced into the graft and the needle was exchanged for the a microsheath.  I then upsized to a 6 Fr Sheath later a 7 French sheath and imaging was performed.  Hand injections were completed to image the access including the central venous system. This demonstrated Occlusion of the axillary vein just proximal to the venous anastomosis with the graft only being kept open by collaterals.  Arterial anastomosis was patent as was the graft to the venous anastomosis and the central venous circulation was patent.  Based on the images, this patient will need intervention to the venous anastomosis and the axillary vein to salvage the graft. I then gave the patient 3000 units of intravenous heparin.  I then crossed the stenosis with a Kumpe catheter and an advantage wire.  Based on the imaging, a 5 mm x ten cm  angioplasty balloon was selected to predilate the lesion.  The balloon was centered around the stenosis and inflated to 14 ATM for 1 minute(s).  On completion imaging, a greater than 50% residual stenosis was present.  After exchanging for a 0.018 wire, an 8 mm diameter by 7.5 cm length Viabahn stent across the venous anastomosis and the axillary vein and then postdilated with a 7 mm diameter high-pressure angioplasty balloon.  Completion imaging showed less  than 10% residual stenosis with brisk flow through the graft   Based on the completion imaging, no further intervention is necessary.  The wire and balloon were removed from the sheath.  A 4-0 Monocryl purse-string suture was sewn around the sheath.  The sheath was  removed while tying down the suture.  A sterile bandage was applied to the puncture site.  COMPLICATIONS: None  CONDITION: Stable   [name]  04/01/2022 11:57 AM    This note was created with Dragon Medical transcription system. Any errors in dictation are purely unintentional.

## 2022-04-02 DIAGNOSIS — N250 Renal osteodystrophy: Secondary | ICD-10-CM | POA: Diagnosis not present

## 2022-04-02 DIAGNOSIS — D631 Anemia in chronic kidney disease: Secondary | ICD-10-CM | POA: Diagnosis not present

## 2022-04-02 DIAGNOSIS — N186 End stage renal disease: Secondary | ICD-10-CM | POA: Diagnosis not present

## 2022-04-02 DIAGNOSIS — D509 Iron deficiency anemia, unspecified: Secondary | ICD-10-CM | POA: Diagnosis not present

## 2022-04-02 DIAGNOSIS — Z992 Dependence on renal dialysis: Secondary | ICD-10-CM | POA: Diagnosis not present

## 2022-04-05 DIAGNOSIS — N186 End stage renal disease: Secondary | ICD-10-CM | POA: Diagnosis not present

## 2022-04-05 DIAGNOSIS — D509 Iron deficiency anemia, unspecified: Secondary | ICD-10-CM | POA: Diagnosis not present

## 2022-04-05 DIAGNOSIS — D631 Anemia in chronic kidney disease: Secondary | ICD-10-CM | POA: Diagnosis not present

## 2022-04-05 DIAGNOSIS — N250 Renal osteodystrophy: Secondary | ICD-10-CM | POA: Diagnosis not present

## 2022-04-05 DIAGNOSIS — Z992 Dependence on renal dialysis: Secondary | ICD-10-CM | POA: Diagnosis not present

## 2022-04-07 DIAGNOSIS — D509 Iron deficiency anemia, unspecified: Secondary | ICD-10-CM | POA: Diagnosis not present

## 2022-04-07 DIAGNOSIS — Z992 Dependence on renal dialysis: Secondary | ICD-10-CM | POA: Diagnosis not present

## 2022-04-07 DIAGNOSIS — N250 Renal osteodystrophy: Secondary | ICD-10-CM | POA: Diagnosis not present

## 2022-04-07 DIAGNOSIS — D631 Anemia in chronic kidney disease: Secondary | ICD-10-CM | POA: Diagnosis not present

## 2022-04-07 DIAGNOSIS — N186 End stage renal disease: Secondary | ICD-10-CM | POA: Diagnosis not present

## 2022-04-08 DIAGNOSIS — E1122 Type 2 diabetes mellitus with diabetic chronic kidney disease: Secondary | ICD-10-CM | POA: Diagnosis not present

## 2022-04-08 DIAGNOSIS — I1311 Hypertensive heart and chronic kidney disease without heart failure, with stage 5 chronic kidney disease, or end stage renal disease: Secondary | ICD-10-CM | POA: Diagnosis not present

## 2022-04-08 DIAGNOSIS — L89151 Pressure ulcer of sacral region, stage 1: Secondary | ICD-10-CM | POA: Diagnosis not present

## 2022-04-08 DIAGNOSIS — S51812D Laceration without foreign body of left forearm, subsequent encounter: Secondary | ICD-10-CM | POA: Diagnosis not present

## 2022-04-08 DIAGNOSIS — I509 Heart failure, unspecified: Secondary | ICD-10-CM | POA: Diagnosis not present

## 2022-04-08 DIAGNOSIS — W19XXXD Unspecified fall, subsequent encounter: Secondary | ICD-10-CM | POA: Diagnosis not present

## 2022-04-09 DIAGNOSIS — D509 Iron deficiency anemia, unspecified: Secondary | ICD-10-CM | POA: Diagnosis not present

## 2022-04-09 DIAGNOSIS — N250 Renal osteodystrophy: Secondary | ICD-10-CM | POA: Diagnosis not present

## 2022-04-09 DIAGNOSIS — N186 End stage renal disease: Secondary | ICD-10-CM | POA: Diagnosis not present

## 2022-04-09 DIAGNOSIS — D631 Anemia in chronic kidney disease: Secondary | ICD-10-CM | POA: Diagnosis not present

## 2022-04-09 DIAGNOSIS — Z992 Dependence on renal dialysis: Secondary | ICD-10-CM | POA: Diagnosis not present

## 2022-04-11 DIAGNOSIS — N250 Renal osteodystrophy: Secondary | ICD-10-CM | POA: Diagnosis not present

## 2022-04-11 DIAGNOSIS — D509 Iron deficiency anemia, unspecified: Secondary | ICD-10-CM | POA: Diagnosis not present

## 2022-04-11 DIAGNOSIS — D631 Anemia in chronic kidney disease: Secondary | ICD-10-CM | POA: Diagnosis not present

## 2022-04-11 DIAGNOSIS — N186 End stage renal disease: Secondary | ICD-10-CM | POA: Diagnosis not present

## 2022-04-11 DIAGNOSIS — Z992 Dependence on renal dialysis: Secondary | ICD-10-CM | POA: Diagnosis not present

## 2022-04-13 DIAGNOSIS — Z992 Dependence on renal dialysis: Secondary | ICD-10-CM | POA: Diagnosis not present

## 2022-04-13 DIAGNOSIS — N186 End stage renal disease: Secondary | ICD-10-CM | POA: Diagnosis not present

## 2022-04-13 DIAGNOSIS — N250 Renal osteodystrophy: Secondary | ICD-10-CM | POA: Diagnosis not present

## 2022-04-13 DIAGNOSIS — D631 Anemia in chronic kidney disease: Secondary | ICD-10-CM | POA: Diagnosis not present

## 2022-04-13 DIAGNOSIS — D509 Iron deficiency anemia, unspecified: Secondary | ICD-10-CM | POA: Diagnosis not present

## 2022-04-16 DIAGNOSIS — N186 End stage renal disease: Secondary | ICD-10-CM | POA: Diagnosis not present

## 2022-04-16 DIAGNOSIS — D631 Anemia in chronic kidney disease: Secondary | ICD-10-CM | POA: Diagnosis not present

## 2022-04-16 DIAGNOSIS — D509 Iron deficiency anemia, unspecified: Secondary | ICD-10-CM | POA: Diagnosis not present

## 2022-04-16 DIAGNOSIS — Z992 Dependence on renal dialysis: Secondary | ICD-10-CM | POA: Diagnosis not present

## 2022-04-16 DIAGNOSIS — N250 Renal osteodystrophy: Secondary | ICD-10-CM | POA: Diagnosis not present

## 2022-04-19 DIAGNOSIS — N250 Renal osteodystrophy: Secondary | ICD-10-CM | POA: Diagnosis not present

## 2022-04-19 DIAGNOSIS — D631 Anemia in chronic kidney disease: Secondary | ICD-10-CM | POA: Diagnosis not present

## 2022-04-19 DIAGNOSIS — D509 Iron deficiency anemia, unspecified: Secondary | ICD-10-CM | POA: Diagnosis not present

## 2022-04-19 DIAGNOSIS — N186 End stage renal disease: Secondary | ICD-10-CM | POA: Diagnosis not present

## 2022-04-19 DIAGNOSIS — Z992 Dependence on renal dialysis: Secondary | ICD-10-CM | POA: Diagnosis not present

## 2022-04-21 DIAGNOSIS — D631 Anemia in chronic kidney disease: Secondary | ICD-10-CM | POA: Diagnosis not present

## 2022-04-21 DIAGNOSIS — N250 Renal osteodystrophy: Secondary | ICD-10-CM | POA: Diagnosis not present

## 2022-04-21 DIAGNOSIS — N186 End stage renal disease: Secondary | ICD-10-CM | POA: Diagnosis not present

## 2022-04-21 DIAGNOSIS — Z992 Dependence on renal dialysis: Secondary | ICD-10-CM | POA: Diagnosis not present

## 2022-04-21 DIAGNOSIS — D509 Iron deficiency anemia, unspecified: Secondary | ICD-10-CM | POA: Diagnosis not present

## 2022-04-22 DIAGNOSIS — Z992 Dependence on renal dialysis: Secondary | ICD-10-CM | POA: Diagnosis not present

## 2022-04-22 DIAGNOSIS — N186 End stage renal disease: Secondary | ICD-10-CM | POA: Diagnosis not present

## 2022-04-23 DIAGNOSIS — N186 End stage renal disease: Secondary | ICD-10-CM | POA: Diagnosis not present

## 2022-04-23 DIAGNOSIS — N250 Renal osteodystrophy: Secondary | ICD-10-CM | POA: Diagnosis not present

## 2022-04-23 DIAGNOSIS — D631 Anemia in chronic kidney disease: Secondary | ICD-10-CM | POA: Diagnosis not present

## 2022-04-23 DIAGNOSIS — D509 Iron deficiency anemia, unspecified: Secondary | ICD-10-CM | POA: Diagnosis not present

## 2022-04-23 DIAGNOSIS — Z992 Dependence on renal dialysis: Secondary | ICD-10-CM | POA: Diagnosis not present

## 2022-04-26 DIAGNOSIS — N186 End stage renal disease: Secondary | ICD-10-CM | POA: Diagnosis not present

## 2022-04-26 DIAGNOSIS — N250 Renal osteodystrophy: Secondary | ICD-10-CM | POA: Diagnosis not present

## 2022-04-26 DIAGNOSIS — D509 Iron deficiency anemia, unspecified: Secondary | ICD-10-CM | POA: Diagnosis not present

## 2022-04-26 DIAGNOSIS — Z992 Dependence on renal dialysis: Secondary | ICD-10-CM | POA: Diagnosis not present

## 2022-04-26 DIAGNOSIS — D631 Anemia in chronic kidney disease: Secondary | ICD-10-CM | POA: Diagnosis not present

## 2022-04-28 DIAGNOSIS — N250 Renal osteodystrophy: Secondary | ICD-10-CM | POA: Diagnosis not present

## 2022-04-28 DIAGNOSIS — D509 Iron deficiency anemia, unspecified: Secondary | ICD-10-CM | POA: Diagnosis not present

## 2022-04-28 DIAGNOSIS — Z992 Dependence on renal dialysis: Secondary | ICD-10-CM | POA: Diagnosis not present

## 2022-04-28 DIAGNOSIS — D631 Anemia in chronic kidney disease: Secondary | ICD-10-CM | POA: Diagnosis not present

## 2022-04-28 DIAGNOSIS — N186 End stage renal disease: Secondary | ICD-10-CM | POA: Diagnosis not present

## 2022-04-30 DIAGNOSIS — Z992 Dependence on renal dialysis: Secondary | ICD-10-CM | POA: Diagnosis not present

## 2022-04-30 DIAGNOSIS — D509 Iron deficiency anemia, unspecified: Secondary | ICD-10-CM | POA: Diagnosis not present

## 2022-04-30 DIAGNOSIS — N250 Renal osteodystrophy: Secondary | ICD-10-CM | POA: Diagnosis not present

## 2022-04-30 DIAGNOSIS — N186 End stage renal disease: Secondary | ICD-10-CM | POA: Diagnosis not present

## 2022-04-30 DIAGNOSIS — D631 Anemia in chronic kidney disease: Secondary | ICD-10-CM | POA: Diagnosis not present

## 2022-05-03 DIAGNOSIS — Z992 Dependence on renal dialysis: Secondary | ICD-10-CM | POA: Diagnosis not present

## 2022-05-03 DIAGNOSIS — N186 End stage renal disease: Secondary | ICD-10-CM | POA: Diagnosis not present

## 2022-05-03 DIAGNOSIS — D631 Anemia in chronic kidney disease: Secondary | ICD-10-CM | POA: Diagnosis not present

## 2022-05-03 DIAGNOSIS — N250 Renal osteodystrophy: Secondary | ICD-10-CM | POA: Diagnosis not present

## 2022-05-03 DIAGNOSIS — D509 Iron deficiency anemia, unspecified: Secondary | ICD-10-CM | POA: Diagnosis not present

## 2022-05-04 ENCOUNTER — Other Ambulatory Visit (INDEPENDENT_AMBULATORY_CARE_PROVIDER_SITE_OTHER): Payer: Self-pay | Admitting: Vascular Surgery

## 2022-05-04 DIAGNOSIS — Z95820 Peripheral vascular angioplasty status with implants and grafts: Secondary | ICD-10-CM

## 2022-05-04 DIAGNOSIS — N186 End stage renal disease: Secondary | ICD-10-CM

## 2022-05-05 DIAGNOSIS — D631 Anemia in chronic kidney disease: Secondary | ICD-10-CM | POA: Diagnosis not present

## 2022-05-05 DIAGNOSIS — D509 Iron deficiency anemia, unspecified: Secondary | ICD-10-CM | POA: Diagnosis not present

## 2022-05-05 DIAGNOSIS — Z992 Dependence on renal dialysis: Secondary | ICD-10-CM | POA: Diagnosis not present

## 2022-05-05 DIAGNOSIS — N250 Renal osteodystrophy: Secondary | ICD-10-CM | POA: Diagnosis not present

## 2022-05-05 DIAGNOSIS — N186 End stage renal disease: Secondary | ICD-10-CM | POA: Diagnosis not present

## 2022-05-06 ENCOUNTER — Encounter (INDEPENDENT_AMBULATORY_CARE_PROVIDER_SITE_OTHER): Payer: Medicare Other

## 2022-05-06 ENCOUNTER — Encounter (INDEPENDENT_AMBULATORY_CARE_PROVIDER_SITE_OTHER): Payer: Self-pay | Admitting: Nurse Practitioner

## 2022-05-06 ENCOUNTER — Ambulatory Visit (INDEPENDENT_AMBULATORY_CARE_PROVIDER_SITE_OTHER): Payer: Medicare Other | Admitting: Nurse Practitioner

## 2022-05-06 ENCOUNTER — Ambulatory Visit (INDEPENDENT_AMBULATORY_CARE_PROVIDER_SITE_OTHER): Payer: Medicare Other

## 2022-05-06 VITALS — BP 97/49 | HR 60 | Resp 16

## 2022-05-06 DIAGNOSIS — N186 End stage renal disease: Secondary | ICD-10-CM

## 2022-05-06 DIAGNOSIS — Z95820 Peripheral vascular angioplasty status with implants and grafts: Secondary | ICD-10-CM | POA: Diagnosis not present

## 2022-05-06 DIAGNOSIS — E1122 Type 2 diabetes mellitus with diabetic chronic kidney disease: Secondary | ICD-10-CM

## 2022-05-06 DIAGNOSIS — Z992 Dependence on renal dialysis: Secondary | ICD-10-CM | POA: Diagnosis not present

## 2022-05-06 DIAGNOSIS — I120 Hypertensive chronic kidney disease with stage 5 chronic kidney disease or end stage renal disease: Secondary | ICD-10-CM | POA: Diagnosis not present

## 2022-05-07 ENCOUNTER — Encounter (INDEPENDENT_AMBULATORY_CARE_PROVIDER_SITE_OTHER): Payer: Medicare Other

## 2022-05-07 ENCOUNTER — Ambulatory Visit (INDEPENDENT_AMBULATORY_CARE_PROVIDER_SITE_OTHER): Payer: Medicare Other | Admitting: Nurse Practitioner

## 2022-05-07 DIAGNOSIS — D509 Iron deficiency anemia, unspecified: Secondary | ICD-10-CM | POA: Diagnosis not present

## 2022-05-07 DIAGNOSIS — D631 Anemia in chronic kidney disease: Secondary | ICD-10-CM | POA: Diagnosis not present

## 2022-05-07 DIAGNOSIS — N250 Renal osteodystrophy: Secondary | ICD-10-CM | POA: Diagnosis not present

## 2022-05-07 DIAGNOSIS — Z992 Dependence on renal dialysis: Secondary | ICD-10-CM | POA: Diagnosis not present

## 2022-05-07 DIAGNOSIS — N186 End stage renal disease: Secondary | ICD-10-CM | POA: Diagnosis not present

## 2022-05-10 DIAGNOSIS — N186 End stage renal disease: Secondary | ICD-10-CM | POA: Diagnosis not present

## 2022-05-10 DIAGNOSIS — D631 Anemia in chronic kidney disease: Secondary | ICD-10-CM | POA: Diagnosis not present

## 2022-05-10 DIAGNOSIS — Z992 Dependence on renal dialysis: Secondary | ICD-10-CM | POA: Diagnosis not present

## 2022-05-10 DIAGNOSIS — N250 Renal osteodystrophy: Secondary | ICD-10-CM | POA: Diagnosis not present

## 2022-05-10 DIAGNOSIS — D509 Iron deficiency anemia, unspecified: Secondary | ICD-10-CM | POA: Diagnosis not present

## 2022-05-12 DIAGNOSIS — D631 Anemia in chronic kidney disease: Secondary | ICD-10-CM | POA: Diagnosis not present

## 2022-05-12 DIAGNOSIS — D509 Iron deficiency anemia, unspecified: Secondary | ICD-10-CM | POA: Diagnosis not present

## 2022-05-12 DIAGNOSIS — Z992 Dependence on renal dialysis: Secondary | ICD-10-CM | POA: Diagnosis not present

## 2022-05-12 DIAGNOSIS — N250 Renal osteodystrophy: Secondary | ICD-10-CM | POA: Diagnosis not present

## 2022-05-12 DIAGNOSIS — N186 End stage renal disease: Secondary | ICD-10-CM | POA: Diagnosis not present

## 2022-05-14 DIAGNOSIS — Z992 Dependence on renal dialysis: Secondary | ICD-10-CM | POA: Diagnosis not present

## 2022-05-14 DIAGNOSIS — N250 Renal osteodystrophy: Secondary | ICD-10-CM | POA: Diagnosis not present

## 2022-05-14 DIAGNOSIS — D631 Anemia in chronic kidney disease: Secondary | ICD-10-CM | POA: Diagnosis not present

## 2022-05-14 DIAGNOSIS — D509 Iron deficiency anemia, unspecified: Secondary | ICD-10-CM | POA: Diagnosis not present

## 2022-05-14 DIAGNOSIS — N186 End stage renal disease: Secondary | ICD-10-CM | POA: Diagnosis not present

## 2022-05-16 DIAGNOSIS — Z992 Dependence on renal dialysis: Secondary | ICD-10-CM | POA: Diagnosis not present

## 2022-05-16 DIAGNOSIS — N186 End stage renal disease: Secondary | ICD-10-CM | POA: Diagnosis not present

## 2022-05-16 DIAGNOSIS — D631 Anemia in chronic kidney disease: Secondary | ICD-10-CM | POA: Diagnosis not present

## 2022-05-16 DIAGNOSIS — D509 Iron deficiency anemia, unspecified: Secondary | ICD-10-CM | POA: Diagnosis not present

## 2022-05-16 DIAGNOSIS — N250 Renal osteodystrophy: Secondary | ICD-10-CM | POA: Diagnosis not present

## 2022-05-19 DIAGNOSIS — N250 Renal osteodystrophy: Secondary | ICD-10-CM | POA: Diagnosis not present

## 2022-05-19 DIAGNOSIS — N186 End stage renal disease: Secondary | ICD-10-CM | POA: Diagnosis not present

## 2022-05-19 DIAGNOSIS — D631 Anemia in chronic kidney disease: Secondary | ICD-10-CM | POA: Diagnosis not present

## 2022-05-19 DIAGNOSIS — D509 Iron deficiency anemia, unspecified: Secondary | ICD-10-CM | POA: Diagnosis not present

## 2022-05-19 DIAGNOSIS — Z992 Dependence on renal dialysis: Secondary | ICD-10-CM | POA: Diagnosis not present

## 2022-05-20 ENCOUNTER — Encounter: Payer: Self-pay | Admitting: Dermatology

## 2022-05-20 ENCOUNTER — Ambulatory Visit (INDEPENDENT_AMBULATORY_CARE_PROVIDER_SITE_OTHER): Payer: Medicare Other | Admitting: Dermatology

## 2022-05-20 VITALS — BP 132/62 | HR 75

## 2022-05-20 DIAGNOSIS — L570 Actinic keratosis: Secondary | ICD-10-CM | POA: Diagnosis not present

## 2022-05-20 DIAGNOSIS — L578 Other skin changes due to chronic exposure to nonionizing radiation: Secondary | ICD-10-CM

## 2022-05-20 NOTE — Progress Notes (Signed)
Follow-Up Visit   Subjective  Matthew [name] is a 88 y.o. male who presents for the following: lesions (Scalp. Hx of AK's on scalp. 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 Hx of PDT treatment in 2022).  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  The patient has spots, moles and lesions to be evaluated, some may be new or changing and the patient has concerns that these could be cancer.   The following portions of the chart were reviewed this encounter and updated as appropriate:      Review of Systems: No other skin or systemic complaints except as noted in HPI or Assessment and Plan.   Objective  Well appearing patient in no apparent distress; mood and affect are within normal limits.  A focused examination was performed including head, including the scalp, face, neck, nose, ears, eyelids, and lips. Relevant physical exam findings are noted in the Assessment and Plan.  left frontal scalp and left temporal scalp (EDC sites) x2, left upper temple x1, crown scalp x10, right temple x3, right eyebrow x1, left paranasal x1, R upper nasal dorsum x1, left zygoma x2 (21) Erythematous thin papules/macules with gritty scale.  Photos taken of previous EDC sites after cryotherapy.        Assessment & Plan   Actinic Damage with PreCancerous Actinic Keratoses Counseling for Topical Chemotherapy Management: Patient exhibits: - Severe, confluent actinic changes with pre-cancerous actinic keratoses that is secondary to cumulative UV radiation exposure over time - Condition that is severe; chronic, not at goal. - diffuse scaly erythematous macules and papules with underlying dyspigmentation - Discussed Prescription "Field Treatment" topical Chemotherapy for Severe, Chronic Confluent Actinic Changes with Pre-Cancerous Actinic Keratoses Field treatment involves treatment of an entire area of skin that has confluent Actinic Changes (Sun/ Ultraviolet light damage) and PreCancerous Actinic Keratoses by method of  PhotoDynamic Therapy (PDT) and/or prescription Topical Chemotherapy agents such as 5-fluorouracil, 5-fluorouracil/calcipotriene, and/or imiquimod.  The purpose is to decrease the number of clinically evident and subclinical PreCancerous lesions to prevent progression to development of skin cancer by chemically destroying early precancer changes that may or may not be visible.  It has been shown to reduce the risk of developing skin cancer in the treated area. As a result of treatment, redness, scaling, crusting, and open sores may occur during treatment course. One or more than one of these methods may be used and may have to be used several times to control, suppress and eliminate the PreCancerous changes. Discussed treatment course, expected reaction, and possible side effects. - Recommend daily broad spectrum sunscreen SPF 30+ to sun-exposed areas, reapply every 2 hours as needed.  - Staying in the shade or wearing long sleeves, sun glasses (UVA+UVB protection) and wide brim hats (4-inch brim around the entire circumference of the hat) are also recommended. - Call for new or changing lesions.  - Start 5-fluorouracil/calcipotriene cream twice a day for 14 days to affected areas on left scalp. Prescription sent to [facility]. Patient advised they will receive an email to purchase the medication online and have it sent to their home. Patient provided with handout reviewing treatment course and side effects and advised to call or message [place] on MyChart with any concerns.  Reviewed course of treatment and expected reaction.  Patient advised to expect inflammation and crusting and advised that erosions are possible.  Patient advised to be diligent with sun protection during and after treatment. Counseled to keep medication out of reach of children  and pets.   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 They are chronic with expected duration over 1 year. A portion of actinic keratoses will progress to squamous cell carcinoma of the skin. It is not possible to reliably predict which spots will progress to skin cancer and so treatment is recommended to prevent development of skin cancer.  Recommend daily broad spectrum sunscreen SPF 30+ to sun-exposed areas, reapply every 2 hours as needed.  Recommend staying in the shade or wearing long sleeves, sun glasses (UVA+UVB protection) and wide brim hats (4-inch brim around the entire circumference of the hat). Call for new or changing lesions.  Once healed: - Start 5-fluorouracil/calcipotriene cream twice a day for 14 days to affected areas including L scalp. Prescription sent to [facility]. Patient advised they will receive an email to purchase the medication online and have it sent to their home. Patient provided with handout reviewing treatment course and side effects and advised to call or message [place] on MyChart with any concerns.  Reviewed course of treatment and expected reaction.  Patient advised to expect inflammation and crusting and advised that erosions are possible.  Patient advised to be diligent with sun protection during and after treatment. Counseled to keep medication out of reach of children and pets.   Recheck on f/up  Destruction of lesion - left frontal scalp and left temporal scalp (EDC sites) x2, left upper temple x1, crown scalp x10, right temple x3, right eyebrow x1, left paranasal x1, R upper nasal dorsum x1, left zygoma x2  Destruction method: cryotherapy   Informed consent: discussed and consent obtained   Lesion destroyed using liquid nitrogen: Yes   Region frozen until ice ball  extended beyond lesion: Yes   Outcome: patient tolerated procedure well with no complications   Post-procedure details: wound care instructions given   Additional details:  Prior to procedure, discussed risks of blister formation, small wound, skin dyspigmentation, or rare scar following cryotherapy. Recommend Vaseline ointment to treated areas while healing.    Return in about 2 months (around 07/21/2022) for AK Follow Up.  I, Emelia Salisbury, CMA, am acting as scribe for Brendolyn Patty, MD.  Documentation: I have reviewed the above documentation for accuracy and completeness, and I agree with the above.  Brendolyn Patty MD

## 2022-05-20 NOTE — Patient Instructions (Addendum)
- Start 5-fluorouracil/calcipotriene cream twice a day for 14 days to affected areas on left scalp. Prescription sent to [facility]. Patient advised they will receive an email to purchase the medication online and have it sent to their home. Patient provided with handout reviewing treatment course and side effects and advised to call or message [place] on MyChart with any concerns. Begin once areas treated today have been healed.   Reviewed course of treatment and expected reaction.  Patient advised to expect inflammation and crusting and advised that erosions are possible.  Patient advised to be diligent with sun protection during and after treatment. Counseled to keep medication out of reach of children and pets.    Instructions for [facility]  One or more of your medications was sent to the [facility]. You will receive an email from them and can purchase the medicine through that link. It will then be mailed to your home at the address you confirmed. If for any reason you do not receive an email from them, please check your spam folder. If you still do not find the email, please let us know. [facility] phone number is [phone].    Cryotherapy Aftercare  Wash gently with soap and water everyday.   Apply Vaseline and Band-Aid daily until healed.    5-Fluorouracil/Calcipotriene Patient Education   Actinic keratoses are the dry, red scaly spots on the skin caused by sun damage. A portion of these spots can turn into skin cancer with time, and treating them can help prevent development of skin cancer.   Treatment of these spots requires removal of the defective skin cells. There are various ways to remove actinic keratoses, including freezing with liquid nitrogen, treatment with creams, or treatment with a blue light procedure in the office.   5-fluorouracil cream is a topical cream used to treat actinic  keratoses. It works by interfering with the growth of abnormal fast-growing skin cells, such as actinic keratoses. These cells peel off and are replaced by healthy ones.   5-fluorouracil/calcipotriene is a combination of the 5-fluorouracil cream with a vitamin D analog cream called calcipotriene. The calcipotriene alone does not treat actinic keratoses. However, when it is combined with 5-fluorouracil, it helps the 5-fluorouracil treat the actinic keratoses much faster so that the same results can be achieved with a much shorter treatment time.  INSTRUCTIONS FOR 5-FLUOROURACIL/CALCIPOTRIENE CREAM:   5-fluorouracil/calcipotriene cream typically only needs to be used for 4-7 days. A thin layer should be applied twice a day to the treatment areas recommended by your physician.   If your physician prescribed you separate tubes of 5-fluourouracil and calcipotriene, apply a thin layer of 5-fluorouracil followed by a thin layer of calcipotriene.   Avoid contact with your eyes, nostrils, and mouth. Do not use 5-fluorouracil/calcipotriene cream on infected or open wounds.   You will develop redness, irritation and some crusting at areas where you have pre-cancer damage/actinic keratoses. IF YOU DEVELOP PAIN, BLEEDING, OR SIGNIFICANT CRUSTING, STOP THE TREATMENT EARLY - you have already gotten a good response and the actinic keratoses should clear up well.  Wash your hands after applying 5-fluorouracil 5% cream on your skin.   A moisturizer or sunscreen with a minimum SPF 30 should be applied each morning.   Once you have finished the treatment, you can apply a thin layer of Vaseline twice a day to irritated areas to soothe and calm the areas more quickly. If you experience significant discomfort,  contact your physician.  For some patients it is necessary to repeat the treatment for best results.  SIDE EFFECTS: When using 5-fluorouracil/calcipotriene cream, you may have mild irritation, such as redness,  dryness, swelling, or a mild burning sensation. This usually resolves within 2 weeks. The more actinic keratoses you have, the more redness and inflammation you can expect during treatment. Eye irritation has been reported rarely. If this occurs, please let us know.  If you have any trouble using this cream, please call the office. If you have any other questions about this information, please do not hesitate to ask me before you leave the office.   Recommend daily broad spectrum sunscreen SPF 30+ to sun-exposed areas, reapply every 2 hours as needed. Call for new or changing lesions.  Staying in the shade or wearing long sleeves, sun glasses (UVA+UVB protection) and wide brim hats (4-inch brim around the entire circumference of the hat) are also recommended for sun protection.     Due to recent changes in healthcare laws, you may see results of your pathology and/or laboratory studies on MyChart before the doctors have had a chance to review them. We understand that in some cases there may be results that are confusing or concerning to you. Please understand that not all results are received at the same time and often the doctors may need to interpret multiple results in order to provide you with the best plan of care or course of treatment. Therefore, we ask that you please give [place] 2 business days to thoroughly review all your results before contacting the office for clarification. Should we see a critical lab result, you will be contacted sooner.   If You Need Anything After Your Visit  If you have any questions or concerns for your doctor, please call our main line at [phone] and press option 4 to reach your doctor's medical assistant. If no one answers, please leave a voicemail as directed and we will return your call as soon as possible. Messages left after 4 pm will be answered the following business day.   You may also send [place] a message via [place]. We typically respond to MyChart  messages within 1-2 business days.  For prescription refills, please ask your pharmacy to contact our office. Our fax number is [phone].  If you have an urgent issue when the clinic is closed that cannot wait until the next business day, you can page your doctor at the number below.    Please note that while we do our best to be available for urgent issues outside of office hours, we are not available 24/7.   If you have an urgent issue and are unable to reach [place], you may choose to seek medical care at your doctor's office, retail clinic, urgent care center, or emergency room.  If you have a medical emergency, please immediately call 911 or go to the emergency department.  Pager Numbers  - Dr. Nehemiah Massed: [phone]  - Dr. Laurence Ferrari: [phone]  - Dr. Nicole Kindred: [phone]  In the event of inclement weather, please call our main line at [phone] for an update on the status of any delays or closures.  Dermatology Medication Tips: Please keep the boxes that topical medications come in in order to help keep track of the instructions about where and how to use these. Pharmacies typically print the medication instructions only on the boxes and not directly on the medication tubes.   If your medication is too expensive, please contact our  office at [phone] option 4 or send [place] a message through [place].   We are unable to tell what your co-pay for medications will be in advance as this is different depending on your insurance coverage. However, we may be able to find a substitute medication at lower cost or fill out paperwork to get insurance to cover a needed medication.   If a prior authorization is required to get your medication covered by your insurance company, please allow [place] 1-2 business days to complete this process.  Drug prices often vary depending on where the prescription is filled and some pharmacies may offer cheaper prices.  The website www.goodrx.com contains  coupons for medications through different pharmacies. The prices here do not account for what the cost may be with help from insurance (it may be cheaper with your insurance), but the website can give you the price if you did not use any insurance.  - You can print the associated coupon and take it with your prescription to the pharmacy.  - You may also stop by our office during regular business hours and pick up a GoodRx coupon card.  - If you need your prescription sent electronically to a different pharmacy, notify our office through [facility] or by phone at [phone] option 4.     Si Usted Necesita Algo Despus de Su Visita  Tambin puede enviarnos un mensaje a travs de Pharmacist, community. Por lo general respondemos a los mensajes de MyChart en el transcurso de 1 a 2 das hbiles.  Para renovar recetas, por favor pida a su farmacia que se ponga en contacto con nuestra oficina. Harland Dingwall de fax es [place] [phone].  Si tiene un asunto urgente cuando la clnica est cerrada y que no puede esperar hasta el siguiente da hbil, puede llamar/localizar a su doctor(a) al nmero que aparece a continuacin.   Por favor, tenga en cuenta que aunque hacemos todo lo posible para estar disponibles para asuntos urgentes fuera del horario de [place], no estamos disponibles las 24 horas del da, los 7 das de la [place].   Si tiene un problema urgente y no puede comunicarse con nosotros, puede optar por buscar atencin mdica  en el consultorio de su doctor(a), en una clnica privada, en un centro de atencin urgente o en una sala de emergencias.  Si tiene Engineering geologist, por favor llame inmediatamente al 911 o vaya a la sala de emergencias.  Nmeros de bper  - Dr. Nehemiah Massed: [phone]  - Dra. Moye: [phone]  - Dra. Nicole Kindred: [phone]  En caso de inclemencias del [place], por favor llame a Johnsie Kindred principal al [phone] para una actualizacin sobre el [place] de  cualquier retraso o cierre.  Consejos para la medicacin en dermatologa: Por favor, guarde las cajas en las que vienen los medicamentos de uso tpico para ayudarle a seguir las instrucciones sobre dnde y cmo usarlos. Las farmacias generalmente imprimen las instrucciones del medicamento slo en las cajas y no directamente en los tubos del [place].   Si su medicamento es muy caro, por favor, pngase en contacto con Zigmund Daniel llamando al [phone] y presione la opcin 4 o envenos un mensaje a travs de Pharmacist, community.   No podemos decirle cul ser su copago por los medicamentos por adelantado ya que esto es diferente dependiendo de la cobertura de su seguro. Sin embargo, es posible que podamos encontrar un medicamento sustituto a Electrical engineer un formulario para que el seguro cubra el medicamento que  se considera necesario.   Si se requiere una autorizacin previa para que su compaa de seguros [place] su medicamento, por favor permtanos de 1 a 2 das hbiles para completar este proceso.  Los precios de los medicamentos varan con frecuencia dependiendo del Environmental consultant de dnde se surte la receta y alguna farmacias pueden ofrecer precios ms baratos.  El sitio web www.goodrx.com tiene cupones para medicamentos de Airline pilot. Los precios aqu no tienen en cuenta lo que podra costar con la ayuda del seguro (puede ser ms barato con su seguro), pero el sitio web puede darle el precio si no utiliz Research scientist (physical sciences).  - Puede imprimir el cupn correspondiente y llevarlo con su receta a la farmacia.  - Tambin puede pasar por nuestra oficina durante el horario de atencin regular y Charity fundraiser una tarjeta de cupones de GoodRx.  - Si necesita que su receta se enve electrnicamente a una farmacia diferente, informe a nuestra oficina a travs de MyChart de [place] o por telfono llamando al [phone] y presione la opcin 4.

## 2022-05-21 DIAGNOSIS — Z992 Dependence on renal dialysis: Secondary | ICD-10-CM | POA: Diagnosis not present

## 2022-05-21 DIAGNOSIS — D631 Anemia in chronic kidney disease: Secondary | ICD-10-CM | POA: Diagnosis not present

## 2022-05-21 DIAGNOSIS — N250 Renal osteodystrophy: Secondary | ICD-10-CM | POA: Diagnosis not present

## 2022-05-21 DIAGNOSIS — N186 End stage renal disease: Secondary | ICD-10-CM | POA: Diagnosis not present

## 2022-05-21 DIAGNOSIS — D509 Iron deficiency anemia, unspecified: Secondary | ICD-10-CM | POA: Diagnosis not present

## 2022-05-23 ENCOUNTER — Encounter (INDEPENDENT_AMBULATORY_CARE_PROVIDER_SITE_OTHER): Payer: Self-pay | Admitting: Nurse Practitioner

## 2022-05-23 DIAGNOSIS — Z992 Dependence on renal dialysis: Secondary | ICD-10-CM | POA: Diagnosis not present

## 2022-05-23 DIAGNOSIS — N186 End stage renal disease: Secondary | ICD-10-CM | POA: Diagnosis not present

## 2022-05-23 NOTE — Progress Notes (Signed)
Subjective:    Patient ID: Matthew [name], male    DOB: 22-Aug-1930, 88 y.o.   MRN: [id number] Chief Complaint  Patient presents with   Follow-up    [facility] 4 week with HDA    The patient returns to the office for followup status post intervention of their dialysis access left brachial axillary AV graft who underwent fistulogram on 04/01/2022.   Following the intervention the access function has significantly improved, with better flow rates and improved KT/V. The patient has not been experiencing increased bleeding times following decannulation and the patient denies increased recirculation. The patient denies an increase in arm swelling. At the present time the patient denies hand pain.  No recent shortening of the patient's walking distance or new symptoms consistent with claudication.  No history of rest pain symptoms. No new ulcers or wounds of the lower extremities have occurred.  The patient denies amaurosis fugax or recent TIA symptoms. There are no recent neurological changes noted. There is no history of DVT, PE or superficial thrombophlebitis. No recent episodes of angina or shortness of breath documented.   Duplex ultrasound of the AV access shows a patent access.  The previously noted stenosis is improved compared to last study.  Flow volume today is 1341 cc/min (previous flow volume was 396 cc/min)       Review of Systems  Hematological:  Does not bruise/bleed easily.  All other systems reviewed and are negative.      Objective:   Physical Exam Vitals reviewed.  HENT:     Head: Normocephalic.  Cardiovascular:     Rate and Rhythm: Normal rate.     Pulses:          Radial pulses are 2+ on the left side.     Arteriovenous access: Left arteriovenous access is present.    Comments: Good thrill and bruit Pulmonary:     Effort: Pulmonary effort is normal.  Skin:    General: Skin is warm and dry.  Neurological:     Mental Status: He is alert and oriented to person,  place, and time.  Psychiatric:        Mood and Affect: Mood normal.        Behavior: Behavior normal.        Thought Content: Thought content normal.        Judgment: Judgment normal.     BP (!) 97/49 (BP Location: Right Arm)   Pulse 60   Resp 16   Past Medical History:  Diagnosis Date   Anginal pain (HCC)    Arthritis    Ascending aortic aneurysm ([place]) 07/18/2015   a.) TTE 07/18/2015: severe dilation of ascending aorta measuring 7.0 cm. b.) TTE 01/10/2018: aortic root 3.8 cm; ascending aorta 6.8 cm; refused surgical intervention/repair.   Atrial fibrillation ([place])    a.) CHA2DS2-VASc = 6 (age x 2, HFrEF, HTN, previous MI, T2DM). b.) rate/rhythm maintained on amiodarone + metoprolol succinate; chronic antiplatelet therapy using clopidogrel   Bradycardia    Broken arm 05/2013   Left   Cardiac murmur    a.) RIGHT upper sternal border   Cardiomyopathy [facility])    Coronary artery disease    DOE (dyspnea on exertion)    ESRD (end stage renal disease) (HCC)    GERD (gastroesophageal reflux disease)    HFrEF (heart failure with reduced ejection fraction) ([place])    a.) TTE 12/03/2013: mod dec LV function; EF 35-40%; severe inferolateral and inferior HK; LA dilated; G3DD; PASP  52 mmHg. b.) TTE 07/18/2015: mild LV dysfunction with mild LVH; EF 45%; mild BAE. c.) TTE 01/10/2018: mod LV dysfunction; EF 45%; diffuse HK   HLD (hyperlipidemia)    Hypertension    IDA (iron deficiency anemia)    Long term current use of antithrombotics/antiplatelets    a.) clopidogrel   Myocardial infarction ([place]) 2000   Peripheral edema    Pneumonia    S/P CABG x 4 2001   a.) LIMA-LAD, SVG-D1, SVG-OM1, SVG-OM2, SVG-PDA   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   left frontal scalp, EDC   Squamous cell carcinoma of skin 05/05/2020   left temporal scalp, in situ, EDC 05/13/20   T2DM (type 2 diabetes mellitus) ([place])    Valvular insufficiency 12/03/2013   a.) TTE 12/03/2013: EF 35-40%; mild AR/MR, b.) TTE  07/18/2015: EF 45%; mild AR/PR, mod TR, severe MR. c.) TTE 01/10/2018: mod AR/TR    Social History   Socioeconomic History   Marital status: Widowed    Spouse name: Not on file   Number of children: Not on file   Years of education: Not on file   Highest education level: Not on file  Occupational History   Not on file  Tobacco Use   Smoking status: Former    Types: Pipe, Cigarettes    Quit date: 72    Years since quitting: 32.0   Smokeless tobacco: Never  Vaping Use   Vaping Use: Never used  Substance and Sexual Activity   Alcohol use: Not Currently    Alcohol/week: 1.0 standard drink of alcohol    Types: 1 Cans of beer per week    Comment: occasionally drinking only,once a month   Drug use: No   Sexual activity: Never  Other Topics Concern   Not on file  Social History Narrative   Lives with daughter and son in law   Social Determinants of Health   Financial Resource Strain: Low Risk  (01/10/2018)   Overall Financial Resource Strain (CARDIA)    Difficulty of Paying Living Expenses: Not hard at all  Food Insecurity: No [place] (01/10/2018)   Hunger Vital Sign    Worried About Running Out of Food in the Last Year: Never true    [place] in the Last Year: Never true  Transportation Needs: No Transportation Needs (01/10/2018)   PRAPARE - Hydrologist (Medical): No    Lack of Transportation (Non-Medical): No  Physical Activity: Inactive (01/10/2018)   Exercise Vital Sign    Days of Exercise per Week: 0 days    Minutes of Exercise per Session: 0 min  Stress: No Stress Concern Present (01/10/2018)   [place]    Feeling of Stress : Not at all  Social Connections: [place] (01/10/2018)   Social Connection and Isolation Panel [NHANES]    Frequency of Communication with Friends and Family: More than three times a week    Frequency of Social Gatherings  with Friends and Family: More than three times a week    Attends Religious Services: More than 4 times per year    Active Member of [employer] or Organizations: Yes    Attends Archivist Meetings: More than 4 times per year    Marital Status: Married  Human resources officer Violence: Not At Risk (01/10/2018)   Humiliation, Afraid, Rape, and Kick questionnaire    Fear of Current or Ex-Partner: No    Emotionally  Abused: No    Physically Abused: No    Sexually Abused: No    Past Surgical History:  Procedure Laterality Date   A/V SHUNTOGRAM Left 04/01/2022   Procedure: A/V SHUNTOGRAM;  Surgeon: Algernon Huxley, MD;  Location: [place] [facility];  Service: Cardiovascular;  Laterality: Left;   AV FISTULA PLACEMENT Left 04/15/2021   Procedure: INSERTION OF ARTERIOVENOUS (AV) GORE-TEX GRAFT ARM ( BRACHIAL AXILLARY);  Surgeon: Algernon Huxley, MD;  Location: [facility] ORS;  Service: Vascular;  Laterality: Left;   CATARACT EXTRACTION W/PHACO Left 07/26/2017   Procedure: CATARACT EXTRACTION PHACO AND INTRAOCULAR LENS PLACEMENT ([place]);  Surgeon: Birder Robson, MD;  Location: [facility] ORS;  Service: Ophthalmology;  Laterality: Left;  [place]   00:45.8 AP%  13.1 CDE  6.00 Fluid Pack Lot # [id number]   CATARACT EXTRACTION W/PHACO Right 08/17/2017   Procedure: CATARACT EXTRACTION PHACO AND INTRAOCULAR LENS PLACEMENT (IOC);  Surgeon: Birder Robson, MD;  Location: [facility] OR[facility];  Service: Ophthalmology;  Laterality: Right;  [place]  01:30 AP% 16.8 CDE 15.24 Fluid pack lot # [id number]   CHOLECYSTECTOMY     CORONARY ARTERY BYPASS GRAFT N/A 2001   Procedure: 5v CABG (LIMA-LAD, SVG-D1, SVG-OM1, SVG-OM2, SVG-PDA)   DIALYSIS/PERMA CATHETER INSERTION N/A 02/09/2018   Procedure: DIALYSIS/PERMA CATHETER INSERTION;  Surgeon: Algernon Huxley, MD;  Location: [place] [facility];  Service: Cardiovascular;  Laterality: N/A;   DIALYSIS/PERMA CATHETER REMOVAL N/A 06/25/2021   Procedure: DIALYSIS/PERMA CATHETER REMOVAL;  Surgeon: Algernon Huxley, MD;   Location: [place] [facility];  Service: Cardiovascular;  Laterality: N/A;   FRACTURE SURGERY Left 2015   LEFT arm   INSERTION OF DIALYSIS CATHETER Right 11/03/2020   Procedure: INSERTION OF [place];  Surgeon: Algernon Huxley, MD;  Location: [facility] OR[facility];  Service: Vascular;  Laterality: Right;   LEFT HEART CATHETERIZATION WITH CORONARY/GRAFT ANGIOGRAM Left 12/04/2013   Procedure: LEFT HEART CATHETERIZATION WITH Beatrix Fetters;  Surgeon: Leonie Man, MD;  [facility] CATH LAB;  Service: Cardiovascular;  Laterality: Left;   LEFT HEART CATHETERIZATION WITH CORONARY/GRAFT ANGIOGRAM Left 02/06/2009   Procedure: LEFT HEART CATHETERIZATION WITH CORONARY/GRAFT ANGIOGRAM; Location: [place]; Surgeon: Lowanda Foster, MD   PERCUTANEOUS CORONARY STENT INTERVENTION (PCI-S) N/A 12/06/2013   Procedure: STAGED PERCUTANEOUS CORONARY STENT INTERVENTION (overlapping 4.0 x 38 mm (mid) and 4.0 x 28 mm (ostial) Promus Primier DES to SVG-RPDA graft);  Surgeon: Sinclair Grooms, MD;  Location: [facility] CATH LAB;  Service: Cardiovascular   REMOVAL OF A DIALYSIS CATHETER N/A 11/03/2020   Procedure: REMOVAL OF A DIALYSIS CATHETER;  Surgeon: Algernon Huxley, MD;  Location: [facility] ORS;  Service: Vascular;  Laterality: N/A;    Family History  Problem Relation Age of Onset   Heart attack Father 58       died first MI at age 104   Heart failure Mother     Allergies  Allergen Reactions   Other     Other reaction(s): Myalgias (intolerance), Other (See Comments) Statin Drugs  Statin Drugs     Amoxicillin Diarrhea       Latest Ref Rng & Units 02/01/2022    6:54 PM 04/15/2021    6:52 AM 04/10/2021    2:34 PM  CBC  WBC 4.0 - 10.5 K/uL 4.8   5.8   Hemoglobin 13.0 - 17.0 g/dL 11.3  10.5  10.9   Hematocrit 39.0 - 52.0 % 35.1  31.0  32.7   Platelets 150 - 400 K/uL 115  169       CMP     Component Value Date/Time   NA 138 02/01/2022 1854   K 4.3 02/01/2022 1854   CL 97 (L)  02/01/2022 1854   CO2 29 02/01/2022 1854   GLUCOSE 106 (H) 02/01/2022 1854   BUN 33 (H) 02/01/2022 1854   CREATININE 2.01 (H) 02/01/2022 1854   CALCIUM 9.1 02/01/2022 1854   PROT 6.3 (L) 02/11/2021 0847   ALBUMIN 3.2 (L) 02/11/2021 0847   AST 33 02/11/2021 0847   ALT 27 02/11/2021 0847   ALKPHOS 149 (H) 02/11/2021 0847   BILITOT 1.0 02/11/2021 0847   GFRNONAA 31 (L) 02/01/2022 1854   GFRAA 25 (L) 05/26/2019 0503     No results found.     Assessment & Plan:   1. ESRD (end stage renal disease) ([place]) Recommend:  The patient is doing well and currently has adequate dialysis access. The patient's dialysis center is not reporting any access issues. Flow pattern is stable when compared to the prior ultrasound.  The patient should have a duplex ultrasound of the dialysis access in 6 months. The patient will follow-up with me in the office after each ultrasound   - VAS [place] (AVF, AVG)  2. 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 [facility]) Continue hypoglycemic medications as already ordered, these medications have been reviewed and there are no changes at this time.  Hgb A1C to be monitored as already arranged by primary service   Current Outpatient Medications on File Prior to Visit  Medication Sig Dispense Refill   acyclovir ointment (ZOVIRAX) 5 % Apply 1 application topically daily as needed (shingles).     amiodarone (PACERONE) 200 MG tablet Take 200 mg by mouth daily.     bisacodyl (DULCOLAX) 10 MG suppository Place 1 suppository (10 mg total) rectally daily as needed for moderate constipation. 12 suppository 0   cholecalciferol (VITAMIN D) 25 MCG (1000 UNIT) tablet Take 1,000 Units by mouth daily with breakfast.     clopidogrel (PLAVIX) 75 MG tablet Take 1 tablet (75 mg total) by mouth daily with breakfast. 30 tablet 12   ezetimibe (ZETIA) 10 MG tablet Take 10 mg by mouth daily before breakfast.     ferrous sulfate 325 (65 FE) MG tablet Take 325  mg by mouth 2 (two) times daily with a meal.      fluticasone (FLONASE) 50 MCG/ACT nasal spray Place 2 sprays into both nostrils daily as needed for allergies or rhinitis.     furosemide (LASIX) 40 MG tablet Take 40 mg by mouth.     gabapentin (NEURONTIN) 300 MG capsule Take 1 capsule (300 mg total) by mouth at bedtime.     glipiZIDE (GLUCOTROL) 5 MG tablet Take 2.5 mg by mouth daily at 12 noon.     levocetirizine (XYZAL) 5 MG tablet Take 10 mg by mouth every morning.      levothyroxine (SYNTHROID) 75 MCG tablet Take 1 tablet (75 mcg total) by mouth daily before breakfast.     metoprolol succinate (TOPROL-XL) 25 MG 24 hr tablet Take 0.5 tablets (12.5 mg total) by mouth at bedtime. 30 tablet 0   midodrine (PROAMATINE) 5 MG tablet Take 1 tablet (5 mg total) by mouth 3 (three) times daily with meals. (Patient taking differently: Take 5 mg by mouth 3 (three) times daily as needed.)     multivitamin (RENA-VIT) TABS tablet Take 1 tablet by mouth daily.     nitroGLYCERIN (NITROSTAT) 0.4  MG SL tablet Place 0.4 mg under the tongue every 5 (five) minutes as needed for chest pain.     Omega-3 Fatty Acids (FISH OIL) 1000 MG CAPS Take 4,000 mg by mouth daily with lunch.     polyethylene glycol (MIRALAX / GLYCOLAX) 17 g packet Take 17 g by mouth daily. 14 each 0   potassium chloride SA (KLOR-CON) 20 MEQ tablet Take 20 mEq by mouth daily.     rosuvastatin (CRESTOR) 40 MG tablet Take 40 mg by mouth at bedtime.     senna-docusate (SENOKOT-S) 8.6-50 MG tablet Take 2 tablets by mouth 2 (two) times daily. 30 tablet 0   Suvorexant (BELSOMRA) 10 MG TABS Take 10 mg by mouth at bedtime.     traMADol (ULTRAM) 50 MG tablet Take 1 tablet (50 mg total) by mouth every 6 (six) hours as needed for moderate pain. (Patient not taking: Reported on 02/11/2022) 10 tablet 0   No current facility-administered medications on file prior to visit.    There are no Patient Instructions on file for this visit. No follow-ups on  file.   Kris Hartmann, NP

## 2022-05-24 DIAGNOSIS — D509 Iron deficiency anemia, unspecified: Secondary | ICD-10-CM | POA: Diagnosis not present

## 2022-05-24 DIAGNOSIS — N250 Renal osteodystrophy: Secondary | ICD-10-CM | POA: Diagnosis not present

## 2022-05-24 DIAGNOSIS — Z992 Dependence on renal dialysis: Secondary | ICD-10-CM | POA: Diagnosis not present

## 2022-05-24 DIAGNOSIS — D631 Anemia in chronic kidney disease: Secondary | ICD-10-CM | POA: Diagnosis not present

## 2022-05-24 DIAGNOSIS — N186 End stage renal disease: Secondary | ICD-10-CM | POA: Diagnosis not present

## 2022-05-26 DIAGNOSIS — N186 End stage renal disease: Secondary | ICD-10-CM | POA: Diagnosis not present

## 2022-05-26 DIAGNOSIS — D631 Anemia in chronic kidney disease: Secondary | ICD-10-CM | POA: Diagnosis not present

## 2022-05-26 DIAGNOSIS — Z992 Dependence on renal dialysis: Secondary | ICD-10-CM | POA: Diagnosis not present

## 2022-05-26 DIAGNOSIS — D509 Iron deficiency anemia, unspecified: Secondary | ICD-10-CM | POA: Diagnosis not present

## 2022-05-26 DIAGNOSIS — N250 Renal osteodystrophy: Secondary | ICD-10-CM | POA: Diagnosis not present

## 2022-05-28 DIAGNOSIS — D509 Iron deficiency anemia, unspecified: Secondary | ICD-10-CM | POA: Diagnosis not present

## 2022-05-28 DIAGNOSIS — D631 Anemia in chronic kidney disease: Secondary | ICD-10-CM | POA: Diagnosis not present

## 2022-05-28 DIAGNOSIS — N250 Renal osteodystrophy: Secondary | ICD-10-CM | POA: Diagnosis not present

## 2022-05-28 DIAGNOSIS — N186 End stage renal disease: Secondary | ICD-10-CM | POA: Diagnosis not present

## 2022-05-28 DIAGNOSIS — Z992 Dependence on renal dialysis: Secondary | ICD-10-CM | POA: Diagnosis not present

## 2022-05-31 DIAGNOSIS — D509 Iron deficiency anemia, unspecified: Secondary | ICD-10-CM | POA: Diagnosis not present

## 2022-05-31 DIAGNOSIS — Z992 Dependence on renal dialysis: Secondary | ICD-10-CM | POA: Diagnosis not present

## 2022-05-31 DIAGNOSIS — D631 Anemia in chronic kidney disease: Secondary | ICD-10-CM | POA: Diagnosis not present

## 2022-05-31 DIAGNOSIS — N250 Renal osteodystrophy: Secondary | ICD-10-CM | POA: Diagnosis not present

## 2022-05-31 DIAGNOSIS — N186 End stage renal disease: Secondary | ICD-10-CM | POA: Diagnosis not present

## 2022-06-02 DIAGNOSIS — Z992 Dependence on renal dialysis: Secondary | ICD-10-CM | POA: Diagnosis not present

## 2022-06-02 DIAGNOSIS — N250 Renal osteodystrophy: Secondary | ICD-10-CM | POA: Diagnosis not present

## 2022-06-02 DIAGNOSIS — D509 Iron deficiency anemia, unspecified: Secondary | ICD-10-CM | POA: Diagnosis not present

## 2022-06-02 DIAGNOSIS — N186 End stage renal disease: Secondary | ICD-10-CM | POA: Diagnosis not present

## 2022-06-02 DIAGNOSIS — D631 Anemia in chronic kidney disease: Secondary | ICD-10-CM | POA: Diagnosis not present

## 2022-06-04 DIAGNOSIS — N186 End stage renal disease: Secondary | ICD-10-CM | POA: Diagnosis not present

## 2022-06-04 DIAGNOSIS — D509 Iron deficiency anemia, unspecified: Secondary | ICD-10-CM | POA: Diagnosis not present

## 2022-06-04 DIAGNOSIS — N250 Renal osteodystrophy: Secondary | ICD-10-CM | POA: Diagnosis not present

## 2022-06-04 DIAGNOSIS — Z992 Dependence on renal dialysis: Secondary | ICD-10-CM | POA: Diagnosis not present

## 2022-06-04 DIAGNOSIS — D631 Anemia in chronic kidney disease: Secondary | ICD-10-CM | POA: Diagnosis not present

## 2022-06-07 DIAGNOSIS — D631 Anemia in chronic kidney disease: Secondary | ICD-10-CM | POA: Diagnosis not present

## 2022-06-07 DIAGNOSIS — N186 End stage renal disease: Secondary | ICD-10-CM | POA: Diagnosis not present

## 2022-06-07 DIAGNOSIS — Z992 Dependence on renal dialysis: Secondary | ICD-10-CM | POA: Diagnosis not present

## 2022-06-07 DIAGNOSIS — D509 Iron deficiency anemia, unspecified: Secondary | ICD-10-CM | POA: Diagnosis not present

## 2022-06-07 DIAGNOSIS — N250 Renal osteodystrophy: Secondary | ICD-10-CM | POA: Diagnosis not present

## 2022-06-08 DIAGNOSIS — I351 Nonrheumatic aortic (valve) insufficiency: Secondary | ICD-10-CM | POA: Diagnosis not present

## 2022-06-08 DIAGNOSIS — I25810 Atherosclerosis of coronary artery bypass graft(s) without angina pectoris: Secondary | ICD-10-CM | POA: Diagnosis not present

## 2022-06-08 DIAGNOSIS — E119 Type 2 diabetes mellitus without complications: Secondary | ICD-10-CM | POA: Diagnosis not present

## 2022-06-08 DIAGNOSIS — I509 Heart failure, unspecified: Secondary | ICD-10-CM | POA: Diagnosis not present

## 2022-06-08 DIAGNOSIS — Z9861 Coronary angioplasty status: Secondary | ICD-10-CM | POA: Diagnosis not present

## 2022-06-08 DIAGNOSIS — I340 Nonrheumatic mitral (valve) insufficiency: Secondary | ICD-10-CM | POA: Diagnosis not present

## 2022-06-08 DIAGNOSIS — E785 Hyperlipidemia, unspecified: Secondary | ICD-10-CM | POA: Diagnosis not present

## 2022-06-08 DIAGNOSIS — E663 Overweight: Secondary | ICD-10-CM | POA: Diagnosis not present

## 2022-06-08 DIAGNOSIS — I4891 Unspecified atrial fibrillation: Secondary | ICD-10-CM | POA: Diagnosis not present

## 2022-06-08 DIAGNOSIS — I2510 Atherosclerotic heart disease of native coronary artery without angina pectoris: Secondary | ICD-10-CM | POA: Diagnosis not present

## 2022-06-08 DIAGNOSIS — I10 Essential (primary) hypertension: Secondary | ICD-10-CM | POA: Diagnosis not present

## 2022-06-09 DIAGNOSIS — N186 End stage renal disease: Secondary | ICD-10-CM | POA: Diagnosis not present

## 2022-06-09 DIAGNOSIS — N250 Renal osteodystrophy: Secondary | ICD-10-CM | POA: Diagnosis not present

## 2022-06-09 DIAGNOSIS — D631 Anemia in chronic kidney disease: Secondary | ICD-10-CM | POA: Diagnosis not present

## 2022-06-09 DIAGNOSIS — Z992 Dependence on renal dialysis: Secondary | ICD-10-CM | POA: Diagnosis not present

## 2022-06-09 DIAGNOSIS — D509 Iron deficiency anemia, unspecified: Secondary | ICD-10-CM | POA: Diagnosis not present

## 2022-06-10 DIAGNOSIS — E032 Hypothyroidism due to medicaments and other exogenous substances: Secondary | ICD-10-CM | POA: Diagnosis not present

## 2022-06-10 DIAGNOSIS — E1122 Type 2 diabetes mellitus with diabetic chronic kidney disease: Secondary | ICD-10-CM | POA: Diagnosis not present

## 2022-06-11 DIAGNOSIS — Z992 Dependence on renal dialysis: Secondary | ICD-10-CM | POA: Diagnosis not present

## 2022-06-11 DIAGNOSIS — N250 Renal osteodystrophy: Secondary | ICD-10-CM | POA: Diagnosis not present

## 2022-06-11 DIAGNOSIS — D631 Anemia in chronic kidney disease: Secondary | ICD-10-CM | POA: Diagnosis not present

## 2022-06-11 DIAGNOSIS — D509 Iron deficiency anemia, unspecified: Secondary | ICD-10-CM | POA: Diagnosis not present

## 2022-06-11 DIAGNOSIS — N186 End stage renal disease: Secondary | ICD-10-CM | POA: Diagnosis not present

## 2022-06-14 DIAGNOSIS — Z992 Dependence on renal dialysis: Secondary | ICD-10-CM | POA: Diagnosis not present

## 2022-06-14 DIAGNOSIS — D631 Anemia in chronic kidney disease: Secondary | ICD-10-CM | POA: Diagnosis not present

## 2022-06-14 DIAGNOSIS — N186 End stage renal disease: Secondary | ICD-10-CM | POA: Diagnosis not present

## 2022-06-14 DIAGNOSIS — N250 Renal osteodystrophy: Secondary | ICD-10-CM | POA: Diagnosis not present

## 2022-06-14 DIAGNOSIS — D509 Iron deficiency anemia, unspecified: Secondary | ICD-10-CM | POA: Diagnosis not present

## 2022-06-15 DIAGNOSIS — M545 Low back pain, unspecified: Secondary | ICD-10-CM | POA: Diagnosis not present

## 2022-06-15 DIAGNOSIS — D649 Anemia, unspecified: Secondary | ICD-10-CM | POA: Diagnosis not present

## 2022-06-15 DIAGNOSIS — I351 Nonrheumatic aortic (valve) insufficiency: Secondary | ICD-10-CM | POA: Diagnosis not present

## 2022-06-15 DIAGNOSIS — R7989 Other specified abnormal findings of blood chemistry: Secondary | ICD-10-CM | POA: Diagnosis not present

## 2022-06-15 DIAGNOSIS — E785 Hyperlipidemia, unspecified: Secondary | ICD-10-CM | POA: Diagnosis not present

## 2022-06-15 DIAGNOSIS — G4700 Insomnia, unspecified: Secondary | ICD-10-CM | POA: Diagnosis not present

## 2022-06-15 DIAGNOSIS — I10 Essential (primary) hypertension: Secondary | ICD-10-CM | POA: Diagnosis not present

## 2022-06-15 DIAGNOSIS — L89151 Pressure ulcer of sacral region, stage 1: Secondary | ICD-10-CM | POA: Diagnosis not present

## 2022-06-15 DIAGNOSIS — J309 Allergic rhinitis, unspecified: Secondary | ICD-10-CM | POA: Diagnosis not present

## 2022-06-15 DIAGNOSIS — E1122 Type 2 diabetes mellitus with diabetic chronic kidney disease: Secondary | ICD-10-CM | POA: Diagnosis not present

## 2022-06-15 DIAGNOSIS — I2510 Atherosclerotic heart disease of native coronary artery without angina pectoris: Secondary | ICD-10-CM | POA: Diagnosis not present

## 2022-06-15 DIAGNOSIS — E032 Hypothyroidism due to medicaments and other exogenous substances: Secondary | ICD-10-CM | POA: Diagnosis not present

## 2022-06-16 DIAGNOSIS — Z992 Dependence on renal dialysis: Secondary | ICD-10-CM | POA: Diagnosis not present

## 2022-06-16 DIAGNOSIS — D631 Anemia in chronic kidney disease: Secondary | ICD-10-CM | POA: Diagnosis not present

## 2022-06-16 DIAGNOSIS — N186 End stage renal disease: Secondary | ICD-10-CM | POA: Diagnosis not present

## 2022-06-16 DIAGNOSIS — N250 Renal osteodystrophy: Secondary | ICD-10-CM | POA: Diagnosis not present

## 2022-06-16 DIAGNOSIS — D509 Iron deficiency anemia, unspecified: Secondary | ICD-10-CM | POA: Diagnosis not present

## 2022-06-18 DIAGNOSIS — D631 Anemia in chronic kidney disease: Secondary | ICD-10-CM | POA: Diagnosis not present

## 2022-06-18 DIAGNOSIS — N250 Renal osteodystrophy: Secondary | ICD-10-CM | POA: Diagnosis not present

## 2022-06-18 DIAGNOSIS — N186 End stage renal disease: Secondary | ICD-10-CM | POA: Diagnosis not present

## 2022-06-18 DIAGNOSIS — D509 Iron deficiency anemia, unspecified: Secondary | ICD-10-CM | POA: Diagnosis not present

## 2022-06-18 DIAGNOSIS — Z992 Dependence on renal dialysis: Secondary | ICD-10-CM | POA: Diagnosis not present

## 2022-06-21 DIAGNOSIS — N186 End stage renal disease: Secondary | ICD-10-CM | POA: Diagnosis not present

## 2022-06-21 DIAGNOSIS — D509 Iron deficiency anemia, unspecified: Secondary | ICD-10-CM | POA: Diagnosis not present

## 2022-06-21 DIAGNOSIS — D631 Anemia in chronic kidney disease: Secondary | ICD-10-CM | POA: Diagnosis not present

## 2022-06-21 DIAGNOSIS — Z992 Dependence on renal dialysis: Secondary | ICD-10-CM | POA: Diagnosis not present

## 2022-06-21 DIAGNOSIS — N250 Renal osteodystrophy: Secondary | ICD-10-CM | POA: Diagnosis not present

## 2022-06-23 DIAGNOSIS — Z992 Dependence on renal dialysis: Secondary | ICD-10-CM | POA: Diagnosis not present

## 2022-06-23 DIAGNOSIS — N250 Renal osteodystrophy: Secondary | ICD-10-CM | POA: Diagnosis not present

## 2022-06-23 DIAGNOSIS — N186 End stage renal disease: Secondary | ICD-10-CM | POA: Diagnosis not present

## 2022-06-23 DIAGNOSIS — D631 Anemia in chronic kidney disease: Secondary | ICD-10-CM | POA: Diagnosis not present

## 2022-06-23 DIAGNOSIS — D509 Iron deficiency anemia, unspecified: Secondary | ICD-10-CM | POA: Diagnosis not present

## 2022-06-25 DIAGNOSIS — N250 Renal osteodystrophy: Secondary | ICD-10-CM | POA: Diagnosis not present

## 2022-06-25 DIAGNOSIS — Z992 Dependence on renal dialysis: Secondary | ICD-10-CM | POA: Diagnosis not present

## 2022-06-25 DIAGNOSIS — D509 Iron deficiency anemia, unspecified: Secondary | ICD-10-CM | POA: Diagnosis not present

## 2022-06-25 DIAGNOSIS — D631 Anemia in chronic kidney disease: Secondary | ICD-10-CM | POA: Diagnosis not present

## 2022-06-25 DIAGNOSIS — N186 End stage renal disease: Secondary | ICD-10-CM | POA: Diagnosis not present

## 2022-06-28 DIAGNOSIS — D509 Iron deficiency anemia, unspecified: Secondary | ICD-10-CM | POA: Diagnosis not present

## 2022-06-28 DIAGNOSIS — Z992 Dependence on renal dialysis: Secondary | ICD-10-CM | POA: Diagnosis not present

## 2022-06-28 DIAGNOSIS — D631 Anemia in chronic kidney disease: Secondary | ICD-10-CM | POA: Diagnosis not present

## 2022-06-28 DIAGNOSIS — N186 End stage renal disease: Secondary | ICD-10-CM | POA: Diagnosis not present

## 2022-06-28 DIAGNOSIS — N250 Renal osteodystrophy: Secondary | ICD-10-CM | POA: Diagnosis not present

## 2022-06-30 DIAGNOSIS — Z992 Dependence on renal dialysis: Secondary | ICD-10-CM | POA: Diagnosis not present

## 2022-06-30 DIAGNOSIS — N186 End stage renal disease: Secondary | ICD-10-CM | POA: Diagnosis not present

## 2022-06-30 DIAGNOSIS — D509 Iron deficiency anemia, unspecified: Secondary | ICD-10-CM | POA: Diagnosis not present

## 2022-06-30 DIAGNOSIS — N250 Renal osteodystrophy: Secondary | ICD-10-CM | POA: Diagnosis not present

## 2022-06-30 DIAGNOSIS — D631 Anemia in chronic kidney disease: Secondary | ICD-10-CM | POA: Diagnosis not present

## 2022-07-02 DIAGNOSIS — D631 Anemia in chronic kidney disease: Secondary | ICD-10-CM | POA: Diagnosis not present

## 2022-07-02 DIAGNOSIS — N250 Renal osteodystrophy: Secondary | ICD-10-CM | POA: Diagnosis not present

## 2022-07-02 DIAGNOSIS — Z992 Dependence on renal dialysis: Secondary | ICD-10-CM | POA: Diagnosis not present

## 2022-07-02 DIAGNOSIS — D509 Iron deficiency anemia, unspecified: Secondary | ICD-10-CM | POA: Diagnosis not present

## 2022-07-02 DIAGNOSIS — N186 End stage renal disease: Secondary | ICD-10-CM | POA: Diagnosis not present

## 2022-07-05 DIAGNOSIS — N186 End stage renal disease: Secondary | ICD-10-CM | POA: Diagnosis not present

## 2022-07-05 DIAGNOSIS — N250 Renal osteodystrophy: Secondary | ICD-10-CM | POA: Diagnosis not present

## 2022-07-05 DIAGNOSIS — Z992 Dependence on renal dialysis: Secondary | ICD-10-CM | POA: Diagnosis not present

## 2022-07-05 DIAGNOSIS — D631 Anemia in chronic kidney disease: Secondary | ICD-10-CM | POA: Diagnosis not present

## 2022-07-05 DIAGNOSIS — D509 Iron deficiency anemia, unspecified: Secondary | ICD-10-CM | POA: Diagnosis not present

## 2022-07-07 DIAGNOSIS — N250 Renal osteodystrophy: Secondary | ICD-10-CM | POA: Diagnosis not present

## 2022-07-07 DIAGNOSIS — N186 End stage renal disease: Secondary | ICD-10-CM | POA: Diagnosis not present

## 2022-07-07 DIAGNOSIS — D631 Anemia in chronic kidney disease: Secondary | ICD-10-CM | POA: Diagnosis not present

## 2022-07-07 DIAGNOSIS — Z992 Dependence on renal dialysis: Secondary | ICD-10-CM | POA: Diagnosis not present

## 2022-07-07 DIAGNOSIS — D509 Iron deficiency anemia, unspecified: Secondary | ICD-10-CM | POA: Diagnosis not present

## 2022-07-08 ENCOUNTER — Other Ambulatory Visit: Payer: Self-pay | Admitting: Cardiovascular Disease

## 2022-07-09 DIAGNOSIS — Z992 Dependence on renal dialysis: Secondary | ICD-10-CM | POA: Diagnosis not present

## 2022-07-09 DIAGNOSIS — N250 Renal osteodystrophy: Secondary | ICD-10-CM | POA: Diagnosis not present

## 2022-07-09 DIAGNOSIS — D509 Iron deficiency anemia, unspecified: Secondary | ICD-10-CM | POA: Diagnosis not present

## 2022-07-09 DIAGNOSIS — N186 End stage renal disease: Secondary | ICD-10-CM | POA: Diagnosis not present

## 2022-07-09 DIAGNOSIS — D631 Anemia in chronic kidney disease: Secondary | ICD-10-CM | POA: Diagnosis not present

## 2022-07-12 DIAGNOSIS — Z992 Dependence on renal dialysis: Secondary | ICD-10-CM | POA: Diagnosis not present

## 2022-07-12 DIAGNOSIS — N250 Renal osteodystrophy: Secondary | ICD-10-CM | POA: Diagnosis not present

## 2022-07-12 DIAGNOSIS — D509 Iron deficiency anemia, unspecified: Secondary | ICD-10-CM | POA: Diagnosis not present

## 2022-07-12 DIAGNOSIS — N186 End stage renal disease: Secondary | ICD-10-CM | POA: Diagnosis not present

## 2022-07-12 DIAGNOSIS — D631 Anemia in chronic kidney disease: Secondary | ICD-10-CM | POA: Diagnosis not present

## 2022-07-13 ENCOUNTER — Other Ambulatory Visit: Payer: Self-pay | Admitting: Cardiovascular Disease

## 2022-07-14 DIAGNOSIS — D509 Iron deficiency anemia, unspecified: Secondary | ICD-10-CM | POA: Diagnosis not present

## 2022-07-14 DIAGNOSIS — N186 End stage renal disease: Secondary | ICD-10-CM | POA: Diagnosis not present

## 2022-07-14 DIAGNOSIS — D631 Anemia in chronic kidney disease: Secondary | ICD-10-CM | POA: Diagnosis not present

## 2022-07-14 DIAGNOSIS — N250 Renal osteodystrophy: Secondary | ICD-10-CM | POA: Diagnosis not present

## 2022-07-14 DIAGNOSIS — Z992 Dependence on renal dialysis: Secondary | ICD-10-CM | POA: Diagnosis not present

## 2022-07-16 DIAGNOSIS — D631 Anemia in chronic kidney disease: Secondary | ICD-10-CM | POA: Diagnosis not present

## 2022-07-16 DIAGNOSIS — D509 Iron deficiency anemia, unspecified: Secondary | ICD-10-CM | POA: Diagnosis not present

## 2022-07-16 DIAGNOSIS — N250 Renal osteodystrophy: Secondary | ICD-10-CM | POA: Diagnosis not present

## 2022-07-16 DIAGNOSIS — Z992 Dependence on renal dialysis: Secondary | ICD-10-CM | POA: Diagnosis not present

## 2022-07-16 DIAGNOSIS — N186 End stage renal disease: Secondary | ICD-10-CM | POA: Diagnosis not present

## 2022-07-19 DIAGNOSIS — D509 Iron deficiency anemia, unspecified: Secondary | ICD-10-CM | POA: Diagnosis not present

## 2022-07-19 DIAGNOSIS — N186 End stage renal disease: Secondary | ICD-10-CM | POA: Diagnosis not present

## 2022-07-19 DIAGNOSIS — D631 Anemia in chronic kidney disease: Secondary | ICD-10-CM | POA: Diagnosis not present

## 2022-07-19 DIAGNOSIS — Z992 Dependence on renal dialysis: Secondary | ICD-10-CM | POA: Diagnosis not present

## 2022-07-19 DIAGNOSIS — N250 Renal osteodystrophy: Secondary | ICD-10-CM | POA: Diagnosis not present

## 2022-07-21 DIAGNOSIS — N250 Renal osteodystrophy: Secondary | ICD-10-CM | POA: Diagnosis not present

## 2022-07-21 DIAGNOSIS — D631 Anemia in chronic kidney disease: Secondary | ICD-10-CM | POA: Diagnosis not present

## 2022-07-21 DIAGNOSIS — D509 Iron deficiency anemia, unspecified: Secondary | ICD-10-CM | POA: Diagnosis not present

## 2022-07-21 DIAGNOSIS — Z992 Dependence on renal dialysis: Secondary | ICD-10-CM | POA: Diagnosis not present

## 2022-07-21 DIAGNOSIS — N186 End stage renal disease: Secondary | ICD-10-CM | POA: Diagnosis not present

## 2022-07-22 DIAGNOSIS — N186 End stage renal disease: Secondary | ICD-10-CM | POA: Diagnosis not present

## 2022-07-22 DIAGNOSIS — Z992 Dependence on renal dialysis: Secondary | ICD-10-CM | POA: Diagnosis not present

## 2022-07-23 DIAGNOSIS — D631 Anemia in chronic kidney disease: Secondary | ICD-10-CM | POA: Diagnosis not present

## 2022-07-23 DIAGNOSIS — N250 Renal osteodystrophy: Secondary | ICD-10-CM | POA: Diagnosis not present

## 2022-07-23 DIAGNOSIS — Z992 Dependence on renal dialysis: Secondary | ICD-10-CM | POA: Diagnosis not present

## 2022-07-23 DIAGNOSIS — D509 Iron deficiency anemia, unspecified: Secondary | ICD-10-CM | POA: Diagnosis not present

## 2022-07-23 DIAGNOSIS — N186 End stage renal disease: Secondary | ICD-10-CM | POA: Diagnosis not present

## 2022-07-26 DIAGNOSIS — D631 Anemia in chronic kidney disease: Secondary | ICD-10-CM | POA: Diagnosis not present

## 2022-07-26 DIAGNOSIS — N250 Renal osteodystrophy: Secondary | ICD-10-CM | POA: Diagnosis not present

## 2022-07-26 DIAGNOSIS — D509 Iron deficiency anemia, unspecified: Secondary | ICD-10-CM | POA: Diagnosis not present

## 2022-07-26 DIAGNOSIS — N186 End stage renal disease: Secondary | ICD-10-CM | POA: Diagnosis not present

## 2022-07-26 DIAGNOSIS — Z992 Dependence on renal dialysis: Secondary | ICD-10-CM | POA: Diagnosis not present

## 2022-07-27 ENCOUNTER — Encounter: Payer: Self-pay | Admitting: Dermatology

## 2022-07-27 ENCOUNTER — Ambulatory Visit (INDEPENDENT_AMBULATORY_CARE_PROVIDER_SITE_OTHER): Payer: Medicare Other | Admitting: Dermatology

## 2022-07-27 VITALS — BP 99/44 | HR 80

## 2022-07-27 DIAGNOSIS — L570 Actinic keratosis: Secondary | ICD-10-CM

## 2022-07-27 DIAGNOSIS — L578 Other skin changes due to chronic exposure to nonionizing radiation: Secondary | ICD-10-CM

## 2022-07-27 DIAGNOSIS — L821 Other seborrheic keratosis: Secondary | ICD-10-CM

## 2022-07-27 DIAGNOSIS — Z872 Personal history of diseases of the skin and subcutaneous tissue: Secondary | ICD-10-CM | POA: Diagnosis not present

## 2022-07-27 DIAGNOSIS — Z85828 Personal history of other malignant neoplasm of skin: Secondary | ICD-10-CM

## 2022-07-27 DIAGNOSIS — Z7189 Other specified counseling: Secondary | ICD-10-CM | POA: Diagnosis not present

## 2022-07-27 DIAGNOSIS — Z79899 Other long term (current) drug therapy: Secondary | ICD-10-CM

## 2022-07-27 DIAGNOSIS — Z5111 Encounter for antineoplastic chemotherapy: Secondary | ICD-10-CM | POA: Diagnosis not present

## 2022-07-27 NOTE — Progress Notes (Unsigned)
Follow-Up Visit   Subjective  Matthew [name] is a 89 y.o. male who presents for the following: Actinic Keratosis (2 month ak follow up).  The patient has spots, moles and lesions to be evaluated, some may be new or changing and the patient has concerns that these could be cancer.   The following portions of the chart were reviewed this encounter and updated as appropriate:      Review of Systems: No other skin or systemic complaints except as noted in HPI or Assessment and Plan.   Objective  Well appearing patient in no apparent distress; mood and affect are within normal limits.  A focused examination was performed including face and scalp. Relevant physical exam findings are noted in the Assessment and Plan.  right temple x 1, left paranasal x 1, left lateral cheek x 1 (3) Erythematous thin papules/macules with gritty scale.    Assessment & Plan  Actinic keratosis (3) right temple x 1, left paranasal x 1, left lateral cheek x 1  Hypertrophic ak at right temple Small residual ak at left paranasal   Actinic keratoses are precancerous spots that appear secondary to cumulative UV radiation exposure/sun exposure over time. They are chronic with expected duration over 1 year. A portion of actinic keratoses will progress to squamous cell carcinoma of the skin. It is not possible to reliably predict which spots will progress to skin cancer and so treatment is recommended to prevent development of skin cancer.  Recommend daily broad spectrum sunscreen SPF 30+ to sun-exposed areas, reapply every 2 hours as needed.  Recommend staying in the shade or wearing long sleeves, sun glasses (UVA+UVB protection) and wide brim hats (4-inch brim around the entire circumference of the hat). Call for new or changing lesions.  Destruction of lesion - right temple x 1, left paranasal x 1, left lateral cheek x 1  Destruction method: cryotherapy   Informed consent: discussed and consent obtained    Lesion destroyed using liquid nitrogen: Yes   Region frozen until ice ball extended beyond lesion: Yes   Outcome: patient tolerated procedure well with no complications   Post-procedure details: wound care instructions given   Additional details:  Prior to procedure, discussed risks of blister formation, small wound, skin dyspigmentation, or rare scar following cryotherapy. Recommend Vaseline ointment to treated areas while healing.    Seborrheic Keratoses - Stuck-on, waxy, tan-brown papules and/or plaques  - Benign-appearing - Discussed benign etiology and prognosis. - Observe - Call for any changes  Actinic Damage with PreCancerous Actinic Keratoses Counseling for Topical Chemotherapy Management: Patient exhibits: - Severe, confluent actinic changes with pre-cancerous actinic keratoses that is secondary to cumulative UV radiation exposure over time - Condition that is severe; chronic, not at goal. - diffuse scaly erythematous macules and papules with underlying dyspigmentation - Discussed Prescription "Field Treatment" topical Chemotherapy for Severe, Chronic Confluent Actinic Changes with Pre-Cancerous Actinic Keratoses Field treatment involves treatment of an entire area of skin that has confluent Actinic Changes (Sun/ Ultraviolet light damage) and PreCancerous Actinic Keratoses by method of PhotoDynamic Therapy (PDT) and/or prescription Topical Chemotherapy agents such as 5-fluorouracil, 5-fluorouracil/calcipotriene, and/or imiquimod.  The purpose is to decrease the number of clinically evident and subclinical PreCancerous lesions to prevent progression to development of skin cancer by chemically destroying early precancer changes that may or may not be visible.  It has been shown to reduce the risk of developing skin cancer in the treated area. As a result of treatment, redness, scaling, crusting,  and open sores may occur during treatment course. One or more than one of these methods may  be used and may have to be used several times to control, suppress and eliminate the PreCancerous changes. Discussed treatment course, expected reaction, and possible side effects. - Recommend daily broad spectrum sunscreen SPF 30+ to sun-exposed areas, reapply every 2 hours as needed.  - Staying in the shade or wearing long sleeves, sun glasses (UVA+UVB protection) and wide brim hats (4-inch brim around the entire circumference of the hat) are also recommended. - Call for new or changing lesions. Patient advised If spot at right temple doesn't clear up - restart  5-fluorouracil/calcipotriene cream cream for twice a day for 1 week. Can use for up to 2 weeks if you do not see any reaction.   History of PreCancerous Actinic Keratosis  Left frontal scalp, left temporal scalp, left upper temple, crown scalp, right temple, right eyebrow, right upper nasal dorsum, left zygoma - site(s) of PreCancerous Actinic Keratosis clear today. - these may recur and new lesions may form requiring treatment to prevent transformation into skin cancer - observe for new or changing spots and contact [place] for appointment if occur - photoprotection with sun protective clothing; sunglasses and broad spectrum sunscreen with SPF of at least 30 + and frequent self skin exams recommended - yearly exams by a dermatologist recommended for persons with history of PreCancerous Actinic Keratoses  Return in about 6 months (around 01/27/2023) for ubse hx of scc hx of aks. I, Ruthell Rummage, CMA, am acting as scribe for Brendolyn Patty, MD.

## 2022-07-27 NOTE — Patient Instructions (Addendum)
If spot at right temple doesn't clear up - restart  5-fluorouracil/calcipotriene cream cream for twice a day for 1 week. Can use for up to 2 weeks if you do not see any reaction.  Avoid eye areas.    Reviewed course of treatment and expected reaction.  Patient advised to expect inflammation and crusting and advised that erosions are possible.  Patient advised to be diligent with sun protection during and after treatment. Counseled to keep medication out of reach of children and pets.   Cryotherapy Aftercare  Wash gently with soap and water everyday.   Apply Vaseline and Band-Aid daily until healed.   Actinic keratoses are precancerous spots that appear secondary to cumulative UV radiation exposure/sun exposure over time. They are chronic with expected duration over 1 year. A portion of actinic keratoses will progress to squamous cell carcinoma of the skin. It is not possible to reliably predict which spots will progress to skin cancer and so treatment is recommended to prevent development of skin cancer.  Recommend daily broad spectrum sunscreen SPF 30+ to sun-exposed areas, reapply every 2 hours as needed.  Recommend staying in the shade or wearing long sleeves, sun glasses (UVA+UVB protection) and wide brim hats (4-inch brim around the entire circumference of the hat). Call for new or changing lesions.    Due to recent changes in healthcare laws, you may see results of your pathology and/or laboratory studies on MyChart before the doctors have had a chance to review them. We understand that in some cases there may be results that are confusing or concerning to you. Please understand that not all results are received at the same time and often the doctors may need to interpret multiple results in order to provide you with the best plan of care or course of treatment. Therefore, we ask that you please give [place] 2 business days to thoroughly review all your results before contacting the office for  clarification. Should we see a critical lab result, you will be contacted sooner.   If You Need Anything After Your Visit  If you have any questions or concerns for your doctor, please call our main line at [phone] and press option 4 to reach your doctor's medical assistant. If no one answers, please leave a voicemail as directed and we will return your call as soon as possible. Messages left after 4 pm will be answered the following business day.   You may also send [place] a message via [place]. We typically respond to MyChart messages within 1-2 business days.  For prescription refills, please ask your pharmacy to contact our office. Our fax number is [phone].  If you have an urgent issue when the clinic is closed that cannot wait until the next business day, you can page your doctor at the number below.    Please note that while we do our best to be available for urgent issues outside of office hours, we are not available 24/7.   If you have an urgent issue and are unable to reach [place], you may choose to seek medical care at your doctor's office, retail clinic, urgent care center, or emergency room.  If you have a medical emergency, please immediately call 911 or go to the emergency department.  Pager Numbers  - Dr. Nehemiah Massed: [phone]  - Dr. Laurence Ferrari: [phone]  - Dr. Nicole Kindred: [phone]  In the event of inclement weather, please call our main line at [phone] for an update on the status of any delays or  closures.  Dermatology Medication Tips: Please keep the boxes that topical medications come in in order to help keep track of the instructions about where and how to use these. Pharmacies typically print the medication instructions only on the boxes and not directly on the medication tubes.   If your medication is too expensive, please contact our office at [phone] option 4 or send [place] a message through [place].   We are unable to tell what your co-pay for  medications will be in advance as this is different depending on your insurance coverage. However, we may be able to find a substitute medication at lower cost or fill out paperwork to get insurance to cover a needed medication.   If a prior authorization is required to get your medication covered by your insurance company, please allow [place] 1-2 business days to complete this process.  Drug prices often vary depending on where the prescription is filled and some pharmacies may offer cheaper prices.  The website www.goodrx.com contains coupons for medications through different pharmacies. The prices here do not account for what the cost may be with help from insurance (it may be cheaper with your insurance), but the website can give you the price if you did not use any insurance.  - You can print the associated coupon and take it with your prescription to the pharmacy.  - You may also stop by our office during regular business hours and pick up a GoodRx coupon card.  - If you need your prescription sent electronically to a different pharmacy, notify our office through [facility] or by phone at [phone] option 4.     Si Usted Necesita Algo Despus de Su Visita  Tambin puede enviarnos un mensaje a travs de Pharmacist, community. Por lo general respondemos a los mensajes de MyChart en el transcurso de 1 a 2 das hbiles.  Para renovar recetas, por favor pida a su farmacia que se ponga en contacto con nuestra oficina. Harland Dingwall de fax es [place] [phone].  Si tiene un asunto urgente cuando la clnica est cerrada y que no puede esperar hasta el siguiente da hbil, puede llamar/localizar a su doctor(a) al nmero que aparece a continuacin.   Por favor, tenga en cuenta que aunque hacemos todo lo posible para estar disponibles para asuntos urgentes fuera del horario de [place], no estamos disponibles las 24 horas del da, los 7 das de la [place].   Si tiene un problema urgente y no puede  comunicarse con nosotros, puede optar por buscar atencin mdica  en el consultorio de su doctor(a), en una clnica privada, en un centro de atencin urgente o en una sala de emergencias.  Si tiene Engineering geologist, por favor llame inmediatamente al 911 o vaya a la sala de emergencias.  Nmeros de bper  - Dr. Nehemiah Massed: [phone]  - Dra. Moye: [phone]  - Dra. Nicole Kindred: [phone]  En caso de inclemencias del [place], por favor llame a Johnsie Kindred principal al [phone] para una actualizacin sobre el [place] de cualquier retraso o cierre.  Consejos para la medicacin en dermatologa: Por favor, guarde las cajas en las que vienen los medicamentos de uso tpico para ayudarle a seguir las instrucciones sobre dnde y cmo usarlos. Las farmacias generalmente imprimen las instrucciones del medicamento slo en las cajas y no directamente en los tubos del [place].   Si su medicamento es muy caro, por favor, pngase en contacto con Zigmund Daniel llamando al [phone] y presione la opcin 4 o  envenos un mensaje a travs de MyChart.   No podemos decirle cul ser su copago por los medicamentos por adelantado ya que esto es diferente dependiendo de la cobertura de su seguro. Sin embargo, es posible que podamos encontrar un medicamento sustituto a Electrical engineer un formulario para que el seguro cubra el medicamento que se considera necesario.   Si se requiere una autorizacin previa para que su compaa de seguros [place] su medicamento, por favor permtanos de 1 a 2 das hbiles para completar este proceso.  Los precios de los medicamentos varan con frecuencia dependiendo del Environmental consultant de dnde se surte la receta y alguna farmacias pueden ofrecer precios ms baratos.  El sitio web www.goodrx.com tiene cupones para medicamentos de Airline pilot. Los precios aqu no tienen en cuenta lo que podra costar con la ayuda del seguro (puede ser ms barato con su seguro), pero  el sitio web puede darle el precio si no utiliz Research scientist (physical sciences).  - Puede imprimir el cupn correspondiente y llevarlo con su receta a la farmacia.  - Tambin puede pasar por nuestra oficina durante el horario de atencin regular y Charity fundraiser una tarjeta de cupones de GoodRx.  - Si necesita que su receta se enve electrnicamente a una farmacia diferente, informe a nuestra oficina a travs de MyChart de [place] o por telfono llamando al [phone] y presione la opcin 4.

## 2022-07-28 DIAGNOSIS — N250 Renal osteodystrophy: Secondary | ICD-10-CM | POA: Diagnosis not present

## 2022-07-28 DIAGNOSIS — D631 Anemia in chronic kidney disease: Secondary | ICD-10-CM | POA: Diagnosis not present

## 2022-07-28 DIAGNOSIS — N186 End stage renal disease: Secondary | ICD-10-CM | POA: Diagnosis not present

## 2022-07-28 DIAGNOSIS — Z992 Dependence on renal dialysis: Secondary | ICD-10-CM | POA: Diagnosis not present

## 2022-07-28 DIAGNOSIS — D509 Iron deficiency anemia, unspecified: Secondary | ICD-10-CM | POA: Diagnosis not present

## 2022-07-30 DIAGNOSIS — N250 Renal osteodystrophy: Secondary | ICD-10-CM | POA: Diagnosis not present

## 2022-07-30 DIAGNOSIS — D509 Iron deficiency anemia, unspecified: Secondary | ICD-10-CM | POA: Diagnosis not present

## 2022-07-30 DIAGNOSIS — N186 End stage renal disease: Secondary | ICD-10-CM | POA: Diagnosis not present

## 2022-07-30 DIAGNOSIS — D631 Anemia in chronic kidney disease: Secondary | ICD-10-CM | POA: Diagnosis not present

## 2022-07-30 DIAGNOSIS — Z992 Dependence on renal dialysis: Secondary | ICD-10-CM | POA: Diagnosis not present

## 2022-08-02 DIAGNOSIS — N186 End stage renal disease: Secondary | ICD-10-CM | POA: Diagnosis not present

## 2022-08-02 DIAGNOSIS — N250 Renal osteodystrophy: Secondary | ICD-10-CM | POA: Diagnosis not present

## 2022-08-02 DIAGNOSIS — D631 Anemia in chronic kidney disease: Secondary | ICD-10-CM | POA: Diagnosis not present

## 2022-08-02 DIAGNOSIS — D509 Iron deficiency anemia, unspecified: Secondary | ICD-10-CM | POA: Diagnosis not present

## 2022-08-02 DIAGNOSIS — Z992 Dependence on renal dialysis: Secondary | ICD-10-CM | POA: Diagnosis not present

## 2022-08-03 ENCOUNTER — Other Ambulatory Visit: Payer: Self-pay | Admitting: Internal Medicine

## 2022-08-04 DIAGNOSIS — Z992 Dependence on renal dialysis: Secondary | ICD-10-CM | POA: Diagnosis not present

## 2022-08-04 DIAGNOSIS — D509 Iron deficiency anemia, unspecified: Secondary | ICD-10-CM | POA: Diagnosis not present

## 2022-08-04 DIAGNOSIS — N250 Renal osteodystrophy: Secondary | ICD-10-CM | POA: Diagnosis not present

## 2022-08-04 DIAGNOSIS — D631 Anemia in chronic kidney disease: Secondary | ICD-10-CM | POA: Diagnosis not present

## 2022-08-04 DIAGNOSIS — N186 End stage renal disease: Secondary | ICD-10-CM | POA: Diagnosis not present

## 2022-08-06 DIAGNOSIS — N250 Renal osteodystrophy: Secondary | ICD-10-CM | POA: Diagnosis not present

## 2022-08-06 DIAGNOSIS — D631 Anemia in chronic kidney disease: Secondary | ICD-10-CM | POA: Diagnosis not present

## 2022-08-06 DIAGNOSIS — N186 End stage renal disease: Secondary | ICD-10-CM | POA: Diagnosis not present

## 2022-08-06 DIAGNOSIS — Z992 Dependence on renal dialysis: Secondary | ICD-10-CM | POA: Diagnosis not present

## 2022-08-06 DIAGNOSIS — D509 Iron deficiency anemia, unspecified: Secondary | ICD-10-CM | POA: Diagnosis not present

## 2022-08-09 DIAGNOSIS — Z992 Dependence on renal dialysis: Secondary | ICD-10-CM | POA: Diagnosis not present

## 2022-08-09 DIAGNOSIS — D631 Anemia in chronic kidney disease: Secondary | ICD-10-CM | POA: Diagnosis not present

## 2022-08-09 DIAGNOSIS — D509 Iron deficiency anemia, unspecified: Secondary | ICD-10-CM | POA: Diagnosis not present

## 2022-08-09 DIAGNOSIS — N186 End stage renal disease: Secondary | ICD-10-CM | POA: Diagnosis not present

## 2022-08-09 DIAGNOSIS — N250 Renal osteodystrophy: Secondary | ICD-10-CM | POA: Diagnosis not present

## 2022-08-11 DIAGNOSIS — N250 Renal osteodystrophy: Secondary | ICD-10-CM | POA: Diagnosis not present

## 2022-08-11 DIAGNOSIS — N186 End stage renal disease: Secondary | ICD-10-CM | POA: Diagnosis not present

## 2022-08-11 DIAGNOSIS — D631 Anemia in chronic kidney disease: Secondary | ICD-10-CM | POA: Diagnosis not present

## 2022-08-11 DIAGNOSIS — D509 Iron deficiency anemia, unspecified: Secondary | ICD-10-CM | POA: Diagnosis not present

## 2022-08-11 DIAGNOSIS — Z992 Dependence on renal dialysis: Secondary | ICD-10-CM | POA: Diagnosis not present

## 2022-08-13 DIAGNOSIS — D631 Anemia in chronic kidney disease: Secondary | ICD-10-CM | POA: Diagnosis not present

## 2022-08-13 DIAGNOSIS — N250 Renal osteodystrophy: Secondary | ICD-10-CM | POA: Diagnosis not present

## 2022-08-13 DIAGNOSIS — D509 Iron deficiency anemia, unspecified: Secondary | ICD-10-CM | POA: Diagnosis not present

## 2022-08-13 DIAGNOSIS — N186 End stage renal disease: Secondary | ICD-10-CM | POA: Diagnosis not present

## 2022-08-13 DIAGNOSIS — Z992 Dependence on renal dialysis: Secondary | ICD-10-CM | POA: Diagnosis not present

## 2022-08-16 ENCOUNTER — Other Ambulatory Visit: Payer: Self-pay | Admitting: Internal Medicine

## 2022-08-16 DIAGNOSIS — N186 End stage renal disease: Secondary | ICD-10-CM | POA: Diagnosis not present

## 2022-08-16 DIAGNOSIS — Z992 Dependence on renal dialysis: Secondary | ICD-10-CM | POA: Diagnosis not present

## 2022-08-16 DIAGNOSIS — D509 Iron deficiency anemia, unspecified: Secondary | ICD-10-CM | POA: Diagnosis not present

## 2022-08-16 DIAGNOSIS — N250 Renal osteodystrophy: Secondary | ICD-10-CM | POA: Diagnosis not present

## 2022-08-16 DIAGNOSIS — D631 Anemia in chronic kidney disease: Secondary | ICD-10-CM | POA: Diagnosis not present

## 2022-08-18 DIAGNOSIS — N250 Renal osteodystrophy: Secondary | ICD-10-CM | POA: Diagnosis not present

## 2022-08-18 DIAGNOSIS — Z992 Dependence on renal dialysis: Secondary | ICD-10-CM | POA: Diagnosis not present

## 2022-08-18 DIAGNOSIS — D631 Anemia in chronic kidney disease: Secondary | ICD-10-CM | POA: Diagnosis not present

## 2022-08-18 DIAGNOSIS — D509 Iron deficiency anemia, unspecified: Secondary | ICD-10-CM | POA: Diagnosis not present

## 2022-08-18 DIAGNOSIS — N186 End stage renal disease: Secondary | ICD-10-CM | POA: Diagnosis not present

## 2022-08-21 DIAGNOSIS — N250 Renal osteodystrophy: Secondary | ICD-10-CM | POA: Diagnosis not present

## 2022-08-21 DIAGNOSIS — Z992 Dependence on renal dialysis: Secondary | ICD-10-CM | POA: Diagnosis not present

## 2022-08-21 DIAGNOSIS — N186 End stage renal disease: Secondary | ICD-10-CM | POA: Diagnosis not present

## 2022-08-21 DIAGNOSIS — D631 Anemia in chronic kidney disease: Secondary | ICD-10-CM | POA: Diagnosis not present

## 2022-08-21 DIAGNOSIS — D509 Iron deficiency anemia, unspecified: Secondary | ICD-10-CM | POA: Diagnosis not present

## 2022-08-22 DIAGNOSIS — N186 End stage renal disease: Secondary | ICD-10-CM | POA: Diagnosis not present

## 2022-08-22 DIAGNOSIS — Z992 Dependence on renal dialysis: Secondary | ICD-10-CM | POA: Diagnosis not present

## 2022-08-23 DIAGNOSIS — N186 End stage renal disease: Secondary | ICD-10-CM | POA: Diagnosis not present

## 2022-08-23 DIAGNOSIS — D631 Anemia in chronic kidney disease: Secondary | ICD-10-CM | POA: Diagnosis not present

## 2022-08-23 DIAGNOSIS — D509 Iron deficiency anemia, unspecified: Secondary | ICD-10-CM | POA: Diagnosis not present

## 2022-08-23 DIAGNOSIS — N250 Renal osteodystrophy: Secondary | ICD-10-CM | POA: Diagnosis not present

## 2022-08-23 DIAGNOSIS — Z992 Dependence on renal dialysis: Secondary | ICD-10-CM | POA: Diagnosis not present

## 2022-08-25 DIAGNOSIS — N186 End stage renal disease: Secondary | ICD-10-CM | POA: Diagnosis not present

## 2022-08-25 DIAGNOSIS — Z992 Dependence on renal dialysis: Secondary | ICD-10-CM | POA: Diagnosis not present

## 2022-08-25 DIAGNOSIS — N250 Renal osteodystrophy: Secondary | ICD-10-CM | POA: Diagnosis not present

## 2022-08-25 DIAGNOSIS — D631 Anemia in chronic kidney disease: Secondary | ICD-10-CM | POA: Diagnosis not present

## 2022-08-25 DIAGNOSIS — D509 Iron deficiency anemia, unspecified: Secondary | ICD-10-CM | POA: Diagnosis not present

## 2022-08-27 DIAGNOSIS — D631 Anemia in chronic kidney disease: Secondary | ICD-10-CM | POA: Diagnosis not present

## 2022-08-27 DIAGNOSIS — D509 Iron deficiency anemia, unspecified: Secondary | ICD-10-CM | POA: Diagnosis not present

## 2022-08-27 DIAGNOSIS — Z992 Dependence on renal dialysis: Secondary | ICD-10-CM | POA: Diagnosis not present

## 2022-08-27 DIAGNOSIS — N250 Renal osteodystrophy: Secondary | ICD-10-CM | POA: Diagnosis not present

## 2022-08-27 DIAGNOSIS — N186 End stage renal disease: Secondary | ICD-10-CM | POA: Diagnosis not present

## 2022-08-30 DIAGNOSIS — N186 End stage renal disease: Secondary | ICD-10-CM | POA: Diagnosis not present

## 2022-08-30 DIAGNOSIS — D631 Anemia in chronic kidney disease: Secondary | ICD-10-CM | POA: Diagnosis not present

## 2022-08-30 DIAGNOSIS — D509 Iron deficiency anemia, unspecified: Secondary | ICD-10-CM | POA: Diagnosis not present

## 2022-08-30 DIAGNOSIS — N250 Renal osteodystrophy: Secondary | ICD-10-CM | POA: Diagnosis not present

## 2022-08-30 DIAGNOSIS — Z992 Dependence on renal dialysis: Secondary | ICD-10-CM | POA: Diagnosis not present

## 2022-09-01 DIAGNOSIS — D509 Iron deficiency anemia, unspecified: Secondary | ICD-10-CM | POA: Diagnosis not present

## 2022-09-01 DIAGNOSIS — N250 Renal osteodystrophy: Secondary | ICD-10-CM | POA: Diagnosis not present

## 2022-09-01 DIAGNOSIS — D631 Anemia in chronic kidney disease: Secondary | ICD-10-CM | POA: Diagnosis not present

## 2022-09-01 DIAGNOSIS — Z992 Dependence on renal dialysis: Secondary | ICD-10-CM | POA: Diagnosis not present

## 2022-09-01 DIAGNOSIS — N186 End stage renal disease: Secondary | ICD-10-CM | POA: Diagnosis not present

## 2022-09-03 DIAGNOSIS — N186 End stage renal disease: Secondary | ICD-10-CM | POA: Diagnosis not present

## 2022-09-03 DIAGNOSIS — Z992 Dependence on renal dialysis: Secondary | ICD-10-CM | POA: Diagnosis not present

## 2022-09-03 DIAGNOSIS — D631 Anemia in chronic kidney disease: Secondary | ICD-10-CM | POA: Diagnosis not present

## 2022-09-03 DIAGNOSIS — N250 Renal osteodystrophy: Secondary | ICD-10-CM | POA: Diagnosis not present

## 2022-09-03 DIAGNOSIS — D509 Iron deficiency anemia, unspecified: Secondary | ICD-10-CM | POA: Diagnosis not present

## 2022-09-06 DIAGNOSIS — N250 Renal osteodystrophy: Secondary | ICD-10-CM | POA: Diagnosis not present

## 2022-09-06 DIAGNOSIS — D631 Anemia in chronic kidney disease: Secondary | ICD-10-CM | POA: Diagnosis not present

## 2022-09-06 DIAGNOSIS — D509 Iron deficiency anemia, unspecified: Secondary | ICD-10-CM | POA: Diagnosis not present

## 2022-09-06 DIAGNOSIS — Z992 Dependence on renal dialysis: Secondary | ICD-10-CM | POA: Diagnosis not present

## 2022-09-06 DIAGNOSIS — N186 End stage renal disease: Secondary | ICD-10-CM | POA: Diagnosis not present

## 2022-09-08 DIAGNOSIS — D509 Iron deficiency anemia, unspecified: Secondary | ICD-10-CM | POA: Diagnosis not present

## 2022-09-08 DIAGNOSIS — Z992 Dependence on renal dialysis: Secondary | ICD-10-CM | POA: Diagnosis not present

## 2022-09-08 DIAGNOSIS — D631 Anemia in chronic kidney disease: Secondary | ICD-10-CM | POA: Diagnosis not present

## 2022-09-08 DIAGNOSIS — N186 End stage renal disease: Secondary | ICD-10-CM | POA: Diagnosis not present

## 2022-09-08 DIAGNOSIS — N250 Renal osteodystrophy: Secondary | ICD-10-CM | POA: Diagnosis not present

## 2022-09-09 ENCOUNTER — Other Ambulatory Visit: Payer: Self-pay | Admitting: Cardiovascular Disease

## 2022-09-10 DIAGNOSIS — N250 Renal osteodystrophy: Secondary | ICD-10-CM | POA: Diagnosis not present

## 2022-09-10 DIAGNOSIS — N186 End stage renal disease: Secondary | ICD-10-CM | POA: Diagnosis not present

## 2022-09-10 DIAGNOSIS — D631 Anemia in chronic kidney disease: Secondary | ICD-10-CM | POA: Diagnosis not present

## 2022-09-10 DIAGNOSIS — Z992 Dependence on renal dialysis: Secondary | ICD-10-CM | POA: Diagnosis not present

## 2022-09-10 DIAGNOSIS — D509 Iron deficiency anemia, unspecified: Secondary | ICD-10-CM | POA: Diagnosis not present

## 2022-09-13 DIAGNOSIS — Z992 Dependence on renal dialysis: Secondary | ICD-10-CM | POA: Diagnosis not present

## 2022-09-13 DIAGNOSIS — N186 End stage renal disease: Secondary | ICD-10-CM | POA: Diagnosis not present

## 2022-09-13 DIAGNOSIS — D631 Anemia in chronic kidney disease: Secondary | ICD-10-CM | POA: Diagnosis not present

## 2022-09-13 DIAGNOSIS — D509 Iron deficiency anemia, unspecified: Secondary | ICD-10-CM | POA: Diagnosis not present

## 2022-09-13 DIAGNOSIS — N250 Renal osteodystrophy: Secondary | ICD-10-CM | POA: Diagnosis not present

## 2022-09-14 ENCOUNTER — Encounter: Payer: Self-pay | Admitting: Cardiovascular Disease

## 2022-09-14 ENCOUNTER — Ambulatory Visit (INDEPENDENT_AMBULATORY_CARE_PROVIDER_SITE_OTHER): Payer: Medicare Other | Admitting: Cardiovascular Disease

## 2022-09-14 VITALS — BP 110/70 | HR 71 | Ht 72.0 in | Wt 206.8 lb

## 2022-09-14 DIAGNOSIS — I429 Cardiomyopathy, unspecified: Secondary | ICD-10-CM | POA: Diagnosis not present

## 2022-09-14 DIAGNOSIS — I5022 Chronic systolic (congestive) heart failure: Secondary | ICD-10-CM

## 2022-09-14 DIAGNOSIS — I480 Paroxysmal atrial fibrillation: Secondary | ICD-10-CM | POA: Diagnosis not present

## 2022-09-14 DIAGNOSIS — E782 Mixed hyperlipidemia: Secondary | ICD-10-CM | POA: Diagnosis not present

## 2022-09-14 DIAGNOSIS — I25810 Atherosclerosis of coronary artery bypass graft(s) without angina pectoris: Secondary | ICD-10-CM

## 2022-09-14 NOTE — Assessment & Plan Note (Signed)
Patient feeling well. Denies chest pain. Shortness of breath unchanged. Recommend doing echocardiogram. Echo 08/2021 EF 55%, mod-severe MVR, mod.-severe AVR, mod TVR, trace PVR. Patient declines at this time. Patient reports he is feeling well. Will notify office if he changes his mind.

## 2022-09-14 NOTE — Assessment & Plan Note (Signed)
EF 55% on echo 08/2021, improved from 39% on echo 11/2020.

## 2022-09-14 NOTE — Assessment & Plan Note (Signed)
Patient in sinus rhythm on auscultation. Continue amiodarone.  

## 2022-09-14 NOTE — Assessment & Plan Note (Signed)
Denies chest pain 

## 2022-09-14 NOTE — Assessment & Plan Note (Signed)
05/2022 LDL 39. Continue rosuvastatin 40 mg.

## 2022-09-14 NOTE — Progress Notes (Signed)
Cardiology Office Note   Date:  09/14/2022   ID:  Matthew [name], DOB Nov 01, 1930, MRN [id number]  PCP:  Sherron Monday, MD  Cardiologist:  Adrian Blackwater, MD      History of Present Illness: Matthew [name] is a 88 y.o. male who presents for  Chief Complaint  Patient presents with   Follow-up    3-4 month follow up    Patient in office for routine cardiac exam. Denies chest pain, shortness of breath, edema, palpitations.     Past Medical History:  Diagnosis Date   Anginal pain    Arthritis    Ascending aortic aneurysm 07/18/2015   a.) TTE 07/18/2015: severe dilation of ascending aorta measuring 7.0 cm. b.) TTE 01/10/2018: aortic root 3.8 cm; ascending aorta 6.8 cm; refused surgical intervention/repair.   Atrial fibrillation    a.) CHA2DS2-VASc = 6 (age x 2, HFrEF, HTN, previous MI, T2DM). b.) rate/rhythm maintained on amiodarone + metoprolol succinate; chronic antiplatelet therapy using clopidogrel   Bradycardia    Broken arm 05/2013   Left   Cardiac murmur    a.) RIGHT upper sternal border   Cardiomyopathy    Coronary artery disease    DOE (dyspnea on exertion)    ESRD (end stage renal disease)    GERD (gastroesophageal reflux disease)    HFrEF (heart failure with reduced ejection fraction)    a.) TTE 12/03/2013: mod dec LV function; EF 35-40%; severe inferolateral and inferior HK; LA dilated; G3DD; PASP 52 mmHg. b.) TTE 07/18/2015: mild LV dysfunction with mild LVH; EF 45%; mild BAE. c.) TTE 01/10/2018: mod LV dysfunction; EF 45%; diffuse HK   HLD (hyperlipidemia)    Hypertension    IDA (iron deficiency anemia)    Long term current use of antithrombotics/antiplatelets    a.) clopidogrel   Myocardial infarction 2000   Peripheral edema    Pneumonia    S/P CABG x 4 2001   a.) LIMA-LAD, SVG-D1, SVG-OM1, SVG-OM2, SVG-PDA   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   left frontal scalp, EDC   Squamous cell carcinoma of skin 05/05/2020   left temporal scalp, in  situ, EDC 05/13/20   T2DM (type 2 diabetes mellitus)    Valvular insufficiency 12/03/2013   a.) TTE 12/03/2013: EF 35-40%; mild AR/MR, b.) TTE 07/18/2015: EF 45%; mild AR/PR, mod TR, severe MR. c.) TTE 01/10/2018: mod AR/TR     Past Surgical History:  Procedure Laterality Date   A/V SHUNTOGRAM Left 04/01/2022   Procedure: A/V SHUNTOGRAM;  Surgeon: Annice Needy, MD;  Location: [facility];  Service: Cardiovascular;  Laterality: Left;   AV FISTULA PLACEMENT Left 04/15/2021   Procedure: INSERTION OF ARTERIOVENOUS (AV) GORE-TEX GRAFT ARM ( BRACHIAL AXILLARY);  Surgeon: Annice Needy, MD;  Location: [facility];  Service: Vascular;  Laterality: Left;   CATARACT EXTRACTION W/PHACO Left 07/26/2017   Procedure: CATARACT EXTRACTION PHACO AND INTRAOCULAR LENS PLACEMENT (IOC);  Surgeon: Galen Manila, MD;  Location: [facility];  Service: Ophthalmology;  Laterality: Left;  [place]   00:45.8 AP%  13.1 CDE  6.00 Fluid Pack Lot # [id number]   CATARACT EXTRACTION W/PHACO Right 08/17/2017   Procedure: CATARACT EXTRACTION PHACO AND INTRAOCULAR LENS PLACEMENT (IOC);  Surgeon: Galen Manila, MD;  Location: [facility];  Service: Ophthalmology;  Laterality: Right;  [place]  01:30 AP% 16.8 CDE 15.24 Fluid pack lot # [id number]   CHOLECYSTECTOMY     CORONARY ARTERY BYPASS GRAFT N/A 2001   Procedure: 5v  CABG (LIMA-LAD, SVG-D1, SVG-OM1, SVG-OM2, SVG-PDA)   DIALYSIS/PERMA CATHETER INSERTION N/A 02/09/2018   Procedure: DIALYSIS/PERMA CATHETER INSERTION;  Surgeon: Annice Needy, MD;  Location: [facility];  Service: Cardiovascular;  Laterality: N/A;   DIALYSIS/PERMA CATHETER REMOVAL N/A 06/25/2021   Procedure: DIALYSIS/PERMA CATHETER REMOVAL;  Surgeon: Annice Needy, MD;  Location: [facility];  Service: Cardiovascular;  Laterality: N/A;   FRACTURE SURGERY Left 2015   LEFT arm   INSERTION OF DIALYSIS CATHETER Right 11/03/2020   Procedure: INSERTION OF Perm Cath in the RIGHT INTERNAL JUGULAR;  Surgeon: Annice Needy, MD;  Location: [facility];  Service: Vascular;  Laterality: Right;   LEFT HEART CATHETERIZATION WITH CORONARY/GRAFT ANGIOGRAM Left 12/04/2013   Procedure: LEFT HEART CATHETERIZATION WITH Isabel Caprice;  Surgeon: Marykay Lex, MD;  Location: [facility] CATH [facility];  Service: Cardiovascular;  Laterality: Left;   LEFT HEART CATHETERIZATION WITH CORONARY/GRAFT ANGIOGRAM Left 02/06/2009   Procedure: LEFT HEART CATHETERIZATION WITH CORONARY/GRAFT ANGIOGRAM; Location: [facility]; Surgeon: Despina Hick, MD   PERCUTANEOUS CORONARY STENT INTERVENTION (PCI-S) N/A 12/06/2013   Procedure: STAGED PERCUTANEOUS CORONARY STENT INTERVENTION (overlapping 4.0 x 38 mm (mid) and 4.0 x 28 mm (ostial) Promus Primier DES to SVG-RPDA graft);  Surgeon: Lesleigh Noe, MD;  Location: [facility];  Service: Cardiovascular   REMOVAL OF A DIALYSIS CATHETER N/A 11/03/2020   Procedure: REMOVAL OF A DIALYSIS CATHETER;  Surgeon: Annice Needy, MD;  Location: [facility];  Service: Vascular;  Laterality: N/A;     Current Outpatient Medications  Medication Sig Dispense Refill   acyclovir ointment (ZOVIRAX) 5 % Apply 1 application topically daily as needed (shingles).     amiodarone (PACERONE) 200 MG tablet TAKE 1 TABLET BY MOUTH ONCE  DAILY 90 tablet 3   bisacodyl (DULCOLAX) 10 MG suppository Place 1 suppository (10 mg total) rectally daily as needed for moderate constipation. 12 suppository 0   cholecalciferol (VITAMIN D) 25 MCG (1000 UNIT) tablet Take 1,000 Units by mouth daily with breakfast.     clopidogrel (PLAVIX) 75 MG tablet TAKE 1 TABLET BY MOUTH ONCE  DAILY 90 tablet 1   ezetimibe (ZETIA) 10 MG tablet TAKE 1 TABLET BY MOUTH ONCE  DAILY 90 tablet 3   ferrous sulfate 325 (65 FE) MG tablet Take 325 mg by mouth 2 (two) times daily with a meal.      fluticasone (FLONASE) 50 MCG/ACT nasal spray Place 2 sprays into both nostrils daily as needed for allergies or rhinitis.     furosemide (LASIX) 40 MG tablet Take 40 mg by  mouth.     gabapentin (NEURONTIN) 300 MG capsule Take 1 capsule (300 mg total) by mouth at bedtime.     glipiZIDE (GLUCOTROL) 5 MG tablet Take 2.5 mg by mouth daily at 12 noon.     levocetirizine (XYZAL) 5 MG tablet Take 10 mg by mouth every morning.      levothyroxine (SYNTHROID) 100 MCG tablet TAKE 1 TABLET BY MOUTH IN THE  MORNING 90 tablet 1   levothyroxine (SYNTHROID) 75 MCG tablet Take 1 tablet (75 mcg total) by mouth daily before breakfast.     metoprolol succinate (TOPROL-XL) 25 MG 24 hr tablet TAKE ONE-HALF TABLET BY MOUTH  DAILY 45 tablet 3   midodrine (PROAMATINE) 5 MG tablet Take 1 tablet (5 mg total) by mouth 3 (three) times daily with meals. (Patient taking differently: Take 5 mg by mouth 3 (three) times daily as needed.)     multivitamin (  RENA-VIT) TABS tablet Take 1 tablet by mouth daily.     nitroGLYCERIN (NITROSTAT) 0.4 MG SL tablet Place 0.4 mg under the tongue every 5 (five) minutes as needed for chest pain.     Omega-3 Fatty Acids (FISH OIL) 1000 MG CAPS Take 4,000 mg by mouth daily with lunch.     polyethylene glycol (MIRALAX / GLYCOLAX) 17 g packet Take 17 g by mouth daily. 14 each 0   potassium chloride SA (KLOR-CON) 20 MEQ tablet Take 20 mEq by mouth daily.     QUEtiapine (SEROQUEL) 25 MG tablet TAKE 1 TABLET BY MOUTH AT  BEDTIME 90 tablet 0   rosuvastatin (CRESTOR) 40 MG tablet Take 40 mg by mouth at bedtime.     senna-docusate (SENOKOT-S) 8.6-50 MG tablet Take 2 tablets by mouth 2 (two) times daily. 30 tablet 0   Suvorexant (BELSOMRA) 10 MG TABS Take 10 mg by mouth at bedtime.     traMADol (ULTRAM) 50 MG tablet Take 1 tablet (50 mg total) by mouth every 6 (six) hours as needed for moderate pain. 10 tablet 0   No current facility-administered medications for this visit.    Allergies:   Other and Amoxicillin    Social History:   reports that he quit smoking about 32 years ago. His smoking use included pipe and cigarettes. He has never used smokeless tobacco. He  reports that he does not currently use alcohol after a past usage of about 1.0 standard drink of alcohol per week. He reports that he does not use drugs.   Family History:  family history includes Heart attack (age of onset: 31) in his father; Heart failure in his mother.    ROS:     Review of Systems  Constitutional: Negative.   HENT: Negative.    Eyes: Negative.   Respiratory: Negative.    Cardiovascular: Negative.   Gastrointestinal: Negative.   Genitourinary: Negative.   Musculoskeletal: Negative.   Skin: Negative.   Neurological: Negative.   Endo/Heme/Allergies: Negative.   Psychiatric/Behavioral: Negative.    All other systems reviewed and are negative.   All other systems are reviewed and negative.   PHYSICAL EXAM: VS:  BP 110/70   Pulse 71   Ht 6' (1.829 m)   Wt 206 lb 12.8 oz (93.8 kg)   SpO2 96%   BMI 28.05 kg/m  , BMI Body mass index is 28.05 kg/m. Last weight:  Wt Readings from Last 3 Encounters:  09/14/22 206 lb 12.8 oz (93.8 kg)  04/01/22 210 lb (95.3 kg)  03/16/22 210 lb (95.3 kg)    Physical Exam Vitals reviewed.  Constitutional:      Appearance: Normal appearance. He is normal weight.  HENT:     Head: Normocephalic.     Nose: Nose normal.     Mouth/Throat:     Mouth: Mucous membranes are moist.  Eyes:     Pupils: Pupils are equal, round, and reactive to light.  Cardiovascular:     Rate and Rhythm: Normal rate and regular rhythm.     Pulses: Normal pulses.     Heart sounds: Normal heart sounds.  Pulmonary:     Effort: Pulmonary effort is normal.  Abdominal:     General: Abdomen is flat. Bowel sounds are normal.  Musculoskeletal:        General: Normal range of motion.     Cervical back: Normal range of motion.  Skin:    General: Skin is warm.  Neurological:  General: No focal deficit present.     Mental Status: He is alert.  Psychiatric:        Mood and Affect: Mood normal.     EKG: none today  Recent Labs: 02/01/2022: BUN 33;  Creatinine, Ser 2.01; Hemoglobin 11.3; Platelets 115; Potassium 4.3; Sodium 138    Lipid Panel No results found for: "CHOL", "TRIG", "HDL", "CHOLHDL", "VLDL", "LDLCALC", "LDLDIRECT"    Other studies Reviewed: Patient: 1344.0 Matthew [name] DOB:  [birth year]  Date:  08/27/2021 11:00 Provider: Adrian Blackwater MD Encounter: ECHO   Page 2 REASON FOR VISIT  Visit for: Echocardiogram/R 06.00  Sex:   Male         wt= 210   lbs.  BP=108/64  Height=72    inches.    TESTS  Imaging: Echocardiogram:  An echocardiogram in (2-d) mode was performed and in Doppler mode with color flow velocity mapping was performed. The aortic valve cusps are abnormal 2.2   cm, flow velocity 1.42    m/s, and systolic calculated mean flow gradient 4  mmHg. Mitral valve diastolic peak flow velocity E 1.2   m/s and E/A ratio 2.1. Aortic root diameter 3.8   cm. The LVOT internal diameter 3.4  cm and flow velocity was abnormal .835  m/s. LV systolic dimension 4.05   cm, diastolic 5.74  cm, posterior wall thickness 1.06   cm, fractional shortening 29.4 %, and EF 55.8  %. IVS thickness 1.15   cm. LA dimension 5.1 cm. Mitral Valve has Moderate-Severe Regurgitation. Aortic Valve has Moderate-Severe Regurgitation. Tricuspid Valve has Moderate Regurgitation. Pulmonic Valve has Trace Regurgitation.     ASSESSMENT  Technically adequate study.  Normal left ventricular systolic function.  Mild left ventricular hypertrophy with GRADE 2 (psuedonormalization ) diastolic dysfunction.  Normal right ventricular systolic function.  Normal right ventricular diastolic function.  Normal left ventricular wall motion.  Normal right ventricular wall motion.  Trace pulmonary regurgitation.  Moderate tricuspid regurgitation.  Moderate pulmonary hypertension.  Moderate to Severe mitral regurgitation.  Moderate to Severe aortic regurgitation.  No pericardial effusion.  Severely dilated Left atrium  Moderately dilated Right  atrium  Mildly dilated Left ventricle  Severely dilated Ao Root  Mild LVH.   THERAPY   Referring physician: Laurier Nancy  Sonographer: Adrian Blackwater.   Adrian Blackwater MD  Electronically signed by: Adrian Blackwater     Date: 08/31/2021 08:43  Patient: 1344.0 - Matthew [name] DOB:  January 31, 1931  Date:  11/02/2019 13:32 Provider: Adrian Blackwater MD Encounter: NUCLEAR STRESS TEST   Page 1 TESTS    [facility] [address] [place], [place] [address] [phone] STUDY:  Gated Stress / Rest Myocardial Perfusion Imaging Tomographic (SPECT) Including attenuation correction Wall Motion, Left Ventricular Ejection Fraction By Gated Technique.Persantine Stress Test. SEX: Male       WEIGHT: 215 lbs    HEIGHT: 72 in               ARMS UP: YES/NO  REFERRING PHYSICIAN: Seward Meth NP-C                                                                                                                                                                                                                      INDICATION FOR STUDY: CAD                                                                                                                                                                                                                    TECHNIQUE:  Approximately 20 minutes following the intravenous administration of 10.2 mCi of Tc-21m Sestamibi after stress testing in a reclined supine position with arms above their head if able to do so, gated SPECT imaging of the heart was performed. After about a 2hr break, the patient was injected intravenously with 35.2 mCi of Tc-62m Sestamibi.  Approximately 45 minutes later in the same position as stress imaging SPECT rest imaging of the heart was performed.  STRESS BY:  Adrian Blackwater, MD PROTOCOL:  Persantine     DOSE ADMIN: 10 cc     ROUTE OF ADMINISTRATION: IV  MAX PRED HR: 134                     85%: 114               75%: 101                                                                                                                   RESTING BP: 108/48    RESTING HR: 51   PEAK BP: 92/44    PEAK HR: 66                                                                    EXERCISE DURATION:    4 min injection                                            REASON FOR TEST TERMINATION:    Protocol end                                                                                                                              SYMPTOMS:   None  EKG RESULTS: NSR. 63/min. Incomplete RBBB. No significant changes with persantine.                                                              IMAGE QUALITY:  Fair                                                                                                                                                                                                                                                                                                                                PERFUSION/WALL MOTION FINDINGS: EF = 58%. Moderate size and intensity fixed mid inferolateral, apical inferior, apical lateral, and apex (17) defects, normal wall motion.                                                                           IMPRESSION: Equivocal stress test with normal LVEF, advise CCTA.  Adrian Blackwater, MD Stress Interpreting Physician / Nuclear Interpreting Physician  Adrian Blackwater MD  Electronically signed by: Adrian Blackwater     Date: 11/06/2019 11:22    ASSESSMENT AND PLAN:    ICD-10-CM   1. Paroxysmal atrial fibrillation  I48.0     2. Coronary artery disease involving coronary bypass graft of native heart without angina pectoris  I25.810     3. Cardiomyopathy, unspecified type  I42.9     4. Mixed hyperlipidemia  E78.2     5. Chronic systolic CHF (congestive heart failure)  I50.22        Problem List Items Addressed This Visit       Cardiovascular and Mediastinum   CAD (coronary artery disease)    Denies chest pain.       Chronic systolic CHF (congestive heart failure)    Patient feeling well. Denies chest pain. Shortness of breath unchanged. Recommend doing echocardiogram. Echo 08/2021 EF 55%, mod-severe MVR, mod.-severe AVR, mod TVR, trace PVR. Patient declines at this time. Patient reports he is feeling well. Will notify office if he changes his mind.       Atrial fibrillation - Primary    Patient in sinus rhythm on auscultation. Continue amiodarone.       Cardiomyopathy    EF 55% on echo 08/2021, improved from 39% on echo 11/2020.        Other   HLD (hyperlipidemia)    05/2022 LDL 39. Continue rosuvastatin 40 mg.         Disposition:   Return in about 4 months (around 01/14/2023).    Total time spent: 30 minutes  Signed,  Adrian Blackwater, MD  09/14/2022 2:22 PM    [facility]

## 2022-09-15 DIAGNOSIS — D509 Iron deficiency anemia, unspecified: Secondary | ICD-10-CM | POA: Diagnosis not present

## 2022-09-15 DIAGNOSIS — Z992 Dependence on renal dialysis: Secondary | ICD-10-CM | POA: Diagnosis not present

## 2022-09-15 DIAGNOSIS — N250 Renal osteodystrophy: Secondary | ICD-10-CM | POA: Diagnosis not present

## 2022-09-15 DIAGNOSIS — N186 End stage renal disease: Secondary | ICD-10-CM | POA: Diagnosis not present

## 2022-09-15 DIAGNOSIS — D631 Anemia in chronic kidney disease: Secondary | ICD-10-CM | POA: Diagnosis not present

## 2022-09-16 ENCOUNTER — Other Ambulatory Visit: Payer: Self-pay | Admitting: Internal Medicine

## 2022-09-16 ENCOUNTER — Other Ambulatory Visit: Payer: Medicare Other

## 2022-09-16 DIAGNOSIS — E1122 Type 2 diabetes mellitus with diabetic chronic kidney disease: Secondary | ICD-10-CM | POA: Diagnosis not present

## 2022-09-16 DIAGNOSIS — E032 Hypothyroidism due to medicaments and other exogenous substances: Secondary | ICD-10-CM | POA: Diagnosis not present

## 2022-09-16 DIAGNOSIS — I2510 Atherosclerotic heart disease of native coronary artery without angina pectoris: Secondary | ICD-10-CM | POA: Diagnosis not present

## 2022-09-17 DIAGNOSIS — D631 Anemia in chronic kidney disease: Secondary | ICD-10-CM | POA: Diagnosis not present

## 2022-09-17 DIAGNOSIS — N186 End stage renal disease: Secondary | ICD-10-CM | POA: Diagnosis not present

## 2022-09-17 DIAGNOSIS — D509 Iron deficiency anemia, unspecified: Secondary | ICD-10-CM | POA: Diagnosis not present

## 2022-09-17 DIAGNOSIS — Z992 Dependence on renal dialysis: Secondary | ICD-10-CM | POA: Diagnosis not present

## 2022-09-17 DIAGNOSIS — N250 Renal osteodystrophy: Secondary | ICD-10-CM | POA: Diagnosis not present

## 2022-09-17 LAB — CBC WITH DIFFERENTIAL/PLATELET
Basophils Absolute: 0.1 10*3/uL (ref 0.0–0.2)
Basos: 1 %
EOS (ABSOLUTE): 0.3 10*3/uL (ref 0.0–0.4)
Eos: 7 %
Hematocrit: 36.9 % — ABNORMAL LOW (ref 37.5–51.0)
Hemoglobin: 12.1 g/dL — ABNORMAL LOW (ref 13.0–17.7)
Immature Grans (Abs): 0 10*3/uL (ref 0.0–0.1)
Immature Granulocytes: 0 %
Lymphocytes Absolute: 0.8 10*3/uL (ref 0.7–3.1)
Lymphs: 19 %
MCH: 31.6 pg (ref 26.6–33.0)
MCHC: 32.8 g/dL (ref 31.5–35.7)
MCV: 96 fL (ref 79–97)
Monocytes Absolute: 0.5 10*3/uL (ref 0.1–0.9)
Monocytes: 13 %
Neutrophils Absolute: 2.5 10*3/uL (ref 1.4–7.0)
Neutrophils: 60 %
Platelets: 115 10*3/uL — ABNORMAL LOW (ref 150–450)
RBC: 3.83 x10E6/uL — ABNORMAL LOW (ref 4.14–5.80)
RDW: 13.5 % (ref 11.6–15.4)
WBC: 4.2 10*3/uL (ref 3.4–10.8)

## 2022-09-17 LAB — LIPID PANEL W/O CHOL/HDL RATIO
Cholesterol, Total: 116 mg/dL (ref 100–199)
HDL: 63 mg/dL (ref >39)
LDL Chol Calc (NIH): 41 mg/dL (ref 0–99)
Triglycerides: 48 mg/dL (ref 0–149)
VLDL Cholesterol Cal: 12 mg/dL (ref 5–40)

## 2022-09-17 LAB — COMPREHENSIVE METABOLIC PANEL
ALT: 326 IU/L — ABNORMAL HIGH (ref 0–44)
AST: 345 IU/L — ABNORMAL HIGH (ref 0–40)
Albumin/Globulin Ratio: 1.4 (ref 1.2–2.2)
Albumin: 4 g/dL (ref 3.7–4.7)
Alkaline Phosphatase: 303 IU/L — ABNORMAL HIGH (ref 44–121)
BUN/Creatinine Ratio: 16 (ref 10–24)
BUN: 50 mg/dL — ABNORMAL HIGH (ref 8–27)
Bilirubin Total: 0.7 mg/dL (ref 0.0–1.2)
CO2: 28 mmol/L (ref 20–29)
Calcium: 9.4 mg/dL (ref 8.6–10.2)
Chloride: 96 mmol/L (ref 96–106)
Creatinine, Ser: 3.12 mg/dL — ABNORMAL HIGH (ref 0.76–1.27)
Globulin, Total: 2.9 g/dL (ref 1.5–4.5)
Glucose: 99 mg/dL (ref 70–99)
Potassium: 5 mmol/L (ref 3.5–5.2)
Sodium: 140 mmol/L (ref 134–144)
Total Protein: 6.9 g/dL (ref 6.0–8.5)
eGFR: 18 mL/min/{1.73_m2} — ABNORMAL LOW (ref >59)

## 2022-09-17 LAB — TSH: TSH: 2.69 u[IU]/mL (ref 0.450–4.500)

## 2022-09-17 LAB — HGB A1C W/O EAG: Hgb A1c MFr Bld: 5.9 % — ABNORMAL HIGH (ref 4.8–5.6)

## 2022-09-20 DIAGNOSIS — D509 Iron deficiency anemia, unspecified: Secondary | ICD-10-CM | POA: Diagnosis not present

## 2022-09-20 DIAGNOSIS — N186 End stage renal disease: Secondary | ICD-10-CM | POA: Diagnosis not present

## 2022-09-20 DIAGNOSIS — D631 Anemia in chronic kidney disease: Secondary | ICD-10-CM | POA: Diagnosis not present

## 2022-09-20 DIAGNOSIS — N250 Renal osteodystrophy: Secondary | ICD-10-CM | POA: Diagnosis not present

## 2022-09-20 DIAGNOSIS — Z992 Dependence on renal dialysis: Secondary | ICD-10-CM | POA: Diagnosis not present

## 2022-09-21 ENCOUNTER — Encounter: Payer: Self-pay | Admitting: Internal Medicine

## 2022-09-21 ENCOUNTER — Ambulatory Visit (INDEPENDENT_AMBULATORY_CARE_PROVIDER_SITE_OTHER): Payer: Medicare Other | Admitting: Internal Medicine

## 2022-09-21 VITALS — BP 96/48 | HR 65 | Ht 72.0 in | Wt 206.8 lb

## 2022-09-21 DIAGNOSIS — I959 Hypotension, unspecified: Secondary | ICD-10-CM | POA: Diagnosis not present

## 2022-09-21 DIAGNOSIS — R748 Abnormal levels of other serum enzymes: Secondary | ICD-10-CM | POA: Diagnosis not present

## 2022-09-21 DIAGNOSIS — E1122 Type 2 diabetes mellitus with diabetic chronic kidney disease: Secondary | ICD-10-CM | POA: Diagnosis not present

## 2022-09-21 DIAGNOSIS — E782 Mixed hyperlipidemia: Secondary | ICD-10-CM

## 2022-09-21 DIAGNOSIS — Z0001 Encounter for general adult medical examination with abnormal findings: Secondary | ICD-10-CM | POA: Diagnosis not present

## 2022-09-21 DIAGNOSIS — E039 Hypothyroidism, unspecified: Secondary | ICD-10-CM

## 2022-09-21 DIAGNOSIS — I120 Hypertensive chronic kidney disease with stage 5 chronic kidney disease or end stage renal disease: Secondary | ICD-10-CM

## 2022-09-21 DIAGNOSIS — Z992 Dependence on renal dialysis: Secondary | ICD-10-CM

## 2022-09-21 DIAGNOSIS — N186 End stage renal disease: Secondary | ICD-10-CM | POA: Diagnosis not present

## 2022-09-21 NOTE — Progress Notes (Signed)
Established Patient Office Visit  Subjective:  Patient ID: Matthew [name], male    DOB: 1930/11/21  Age: 89 y.o. MRN: [id number]  Chief Complaint  Patient presents with   Annual Exam    AWV, 3 month follow up, discuss lab results.    No new complaints, here for AWV and refer to scanned documents. No new complaints, here for lab review and medication refills. Labs reviewed and notable for well controlled diabetes, A1c at target and lipids at target but cmp notable for marked elevation in transaminases. Denies any hypoglycemic episodes and home bg readings have been at target. Denies any recent flu like illness.    No other concerns at this time.   Past Medical History:  Diagnosis Date   Anginal pain (HCC)    Arthritis    Ascending aortic aneurysm (HCC) 07/18/2015   a.) TTE 07/18/2015: severe dilation of ascending aorta measuring 7.0 cm. b.) TTE 01/10/2018: aortic root 3.8 cm; ascending aorta 6.8 cm; refused surgical intervention/repair.   Atrial fibrillation (HCC)    a.) CHA2DS2-VASc = 6 (age x 2, HFrEF, HTN, previous MI, T2DM). b.) rate/rhythm maintained on amiodarone + metoprolol succinate; chronic antiplatelet therapy using clopidogrel   Bradycardia    Broken arm 05/2013   Left   Cardiac murmur    a.) RIGHT upper sternal border   Cardiomyopathy [facility])    Coronary artery disease    DOE (dyspnea on exertion)    ESRD (end stage renal disease) (HCC)    GERD (gastroesophageal reflux disease)    HFrEF (heart failure with reduced ejection fraction) (HCC)    a.) TTE 12/03/2013: mod dec LV function; EF 35-40%; severe inferolateral and inferior HK; LA dilated; G3DD; PASP 52 mmHg. b.) TTE 07/18/2015: mild LV dysfunction with mild LVH; EF 45%; mild BAE. c.) TTE 01/10/2018: mod LV dysfunction; EF 45%; diffuse HK   HLD (hyperlipidemia)    Hypertension    IDA (iron deficiency anemia)    Long term current use of antithrombotics/antiplatelets    a.) clopidogrel   Myocardial infarction  (HCC) 2000   Peripheral edema    Pneumonia    Evonda Enge/P CABG x 4 2001   a.) LIMA-LAD, SVG-D1, SVG-OM1, SVG-OM2, SVG-PDA   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   left frontal scalp, EDC   Squamous cell carcinoma of skin 05/05/2020   left temporal scalp, in situ, EDC 05/13/20   T2DM (type 2 diabetes mellitus) (HCC)    Valvular insufficiency 12/03/2013   a.) TTE 12/03/2013: EF 35-40%; mild AR/MR, b.) TTE 07/18/2015: EF 45%; mild AR/PR, mod TR, severe MR. c.) TTE 01/10/2018: mod AR/TR    Past Surgical History:  Procedure Laterality Date   A/V SHUNTOGRAM Left 04/01/2022   Procedure: A/V SHUNTOGRAM;  Surgeon: Annice Needy, MD;  Location: [facility] INVASIVE CV LAB;  Service: Cardiovascular;  Laterality: Left;   AV FISTULA PLACEMENT Left 04/15/2021   Procedure: INSERTION OF ARTERIOVENOUS (AV) GORE-TEX GRAFT ARM ( BRACHIAL AXILLARY);  Surgeon: Annice Needy, MD;  Location: [facility] ORS;  Service: Vascular;  Laterality: Left;   CATARACT EXTRACTION W/PHACO Left 07/26/2017   Procedure: CATARACT EXTRACTION PHACO AND INTRAOCULAR LENS PLACEMENT (IOC);  Surgeon: Galen Manila, MD;  Location: [facility] ORS;  Service: Ophthalmology;  Laterality: Left;  [place]   00:45.8 AP%  13.1 CDE  6.00 Fluid Pack Lot # [id number]   CATARACT EXTRACTION W/PHACO Right 08/17/2017   Procedure: CATARACT EXTRACTION PHACO AND INTRAOCULAR LENS PLACEMENT (IOC);  Surgeon: Galen Manila, MD;  Location: [facility]  ORS;  Service: Ophthalmology;  Laterality: Right;  [place]  01:30 AP% 16.8 CDE 15.24 Fluid pack lot # [id number]   CHOLECYSTECTOMY     CORONARY ARTERY BYPASS GRAFT N/A 2001   Procedure: 5v CABG (LIMA-LAD, SVG-D1, SVG-OM1, SVG-OM2, SVG-PDA)   DIALYSIS/PERMA CATHETER INSERTION N/A 02/09/2018   Procedure: DIALYSIS/PERMA CATHETER INSERTION;  Surgeon: Annice Needy, MD;  Location: [facility] INVASIVE CV LAB;  Service: Cardiovascular;  Laterality: N/A;   DIALYSIS/PERMA CATHETER REMOVAL N/A 06/25/2021   Procedure: DIALYSIS/PERMA CATHETER REMOVAL;  Surgeon:  Annice Needy, MD;  Location: [facility] INVASIVE CV LAB;  Service: Cardiovascular;  Laterality: N/A;   FRACTURE SURGERY Left 2015   LEFT arm   INSERTION OF DIALYSIS CATHETER Right 11/03/2020   Procedure: INSERTION OF Perm Cath in the RIGHT INTERNAL JUGULAR;  Surgeon: Annice Needy, MD;  Location: [facility] ORS;  Service: Vascular;  Laterality: Right;   LEFT HEART CATHETERIZATION WITH CORONARY/GRAFT ANGIOGRAM Left 12/04/2013   Procedure: LEFT HEART CATHETERIZATION WITH Isabel Caprice;  Surgeon: Marykay Lex, MD;  Location: [facility] CATH LAB;  Service: Cardiovascular;  Laterality: Left;   LEFT HEART CATHETERIZATION WITH CORONARY/GRAFT ANGIOGRAM Left 02/06/2009   Procedure: LEFT HEART CATHETERIZATION WITH CORONARY/GRAFT ANGIOGRAM; Location: [facility]; Surgeon: Despina Hick, MD   PERCUTANEOUS CORONARY STENT INTERVENTION (PCI-Kelsye Loomer) N/A 12/06/2013   Procedure: STAGED PERCUTANEOUS CORONARY STENT INTERVENTION (overlapping 4.0 x 38 mm (mid) and 4.0 x 28 mm (ostial) Promus Primier DES to SVG-RPDA graft);  Surgeon: Lesleigh Noe, MD;  Location: [facility] CATH LAB;  Service: Cardiovascular   REMOVAL OF A DIALYSIS CATHETER N/A 11/03/2020   Procedure: REMOVAL OF A DIALYSIS CATHETER;  Surgeon: Annice Needy, MD;  Location: [facility] ORS;  Service: Vascular;  Laterality: N/A;    Social History   Socioeconomic History   Marital status: Widowed    Spouse name: Not on file   Number of children: Not on file   Years of education: Not on file   Highest education level: Not on file  Occupational History   Not on file  Tobacco Use   Smoking status: Former    Types: Pipe, Cigarettes    Quit date: 51    Years since quitting: 32.3   Smokeless tobacco: Never  Vaping Use   Vaping Use: Never used  Substance and Sexual Activity   Alcohol use: Not Currently    Alcohol/week: 1.0 standard drink of alcohol    Types: 1 Cans of beer per week    Comment: occasionally drinking only,once a month   Drug use: No   Sexual activity: Never   Other Topics Concern   Not on file  Social History Narrative   Lives with daughter and son in law   Social Determinants of Health   Financial Resource Strain: Low Risk  (01/10/2018)   Overall Financial Resource Strain (CARDIA)    Difficulty of Paying Living Expenses: Not hard at all  Food Insecurity: No Food Insecurity (01/10/2018)   Hunger Vital Sign    Worried About Running Out of Food in the Last Year: Never true    Ran Out of Food in the Last Year: Never true  Transportation Needs: No Transportation Needs (01/10/2018)   PRAPARE - Administrator, Civil Service (Medical): No    Lack of Transportation (Non-Medical): No  Physical Activity: Inactive (01/10/2018)   Exercise Vital Sign    Days of Exercise per Week: 0 days    Minutes of Exercise per Session: 0 min  Stress: No Stress Concern  Present (01/10/2018)   [employer] of Occupational Health - Occupational Stress Questionnaire    Feeling of Stress : Not at all  Social Connections: Socially Integrated (01/10/2018)   Social Connection and Isolation Panel [NHANES]    Frequency of Communication with Friends and Family: More than three times a week    Frequency of Social Gatherings with Friends and Family: More than three times a week    Attends Religious Services: More than 4 times per year    Active Member of [employer] or Organizations: Yes    Attends Engineer, structural: More than 4 times per year    Marital Status: Married  Catering manager Violence: Not At Risk (01/10/2018)   Humiliation, Afraid, Rape, and Kick questionnaire    Fear of Current or Ex-Partner: No    Emotionally Abused: No    Physically Abused: No    Sexually Abused: No    Family History  Problem Relation Age of Onset   Heart attack Father 31       died first MI at age 46   Heart failure Mother     Allergies  Allergen Reactions   Other     Other reaction(Zyliah Schier): Myalgias (intolerance), Other (See Comments) Statin Drugs  Statin Drugs      Amoxicillin Diarrhea    Review of Systems  Constitutional: Negative.   HENT: Negative.    Eyes: Negative.   Respiratory: Negative.    Cardiovascular: Negative.   Gastrointestinal: Negative.   Genitourinary: Negative.   Skin: Negative.   Neurological: Negative.   Endo/Heme/Allergies: Negative.        Objective:   BP (!) 96/48   Pulse 65   Ht 6' (1.829 m)   Wt 206 lb 12.8 oz (93.8 kg)   SpO2 97%   BMI 28.05 kg/m   Vitals:   09/21/22 1259  BP: (!) 96/48  Pulse: 65  Height: 6' (1.829 m)  Weight: 206 lb 12.8 oz (93.8 kg)  SpO2: 97%  BMI (Calculated): 28.04    Physical Exam Vitals reviewed.  Constitutional:      Appearance: Normal appearance. He is obese.  HENT:     Head: Normocephalic.     Left Ear: There is no impacted cerumen.     Nose: Nose normal.     Mouth/Throat:     Mouth: Mucous membranes are moist.     Pharynx: No posterior oropharyngeal erythema.  Eyes:     Extraocular Movements: Extraocular movements intact.     Pupils: Pupils are equal, round, and reactive to light.  Cardiovascular:     Rate and Rhythm: Regular rhythm.     Chest Wall: PMI is not displaced.     Pulses: Normal pulses.     Heart sounds: Normal heart sounds. No murmur heard. Pulmonary:     Effort: Pulmonary effort is normal.     Breath sounds: Normal air entry. No rhonchi or rales.  Abdominal:     General: Abdomen is flat. Bowel sounds are normal. There is no distension.     Palpations: Abdomen is soft. There is no hepatomegaly, splenomegaly or mass.     Tenderness: There is no abdominal tenderness.  Musculoskeletal:        General: Normal range of motion.     Cervical back: Normal range of motion and neck supple.     Right lower leg: No edema.     Left lower leg: No edema.  Skin:    General: Skin is warm and dry.  Neurological:     General: No focal deficit present.     Mental Status: He is alert and oriented to person, place, and time.     Cranial Nerves: No cranial  nerve deficit.     Motor: No weakness.  Psychiatric:        Mood and Affect: Mood normal.        Behavior: Behavior normal.      No results found for any visits on 09/21/22.  Recent Results (from the past 2160 hour(Johnavon Mcclafferty))  CBC with Differential/Platelet     Status: Abnormal   Collection Time: 09/16/22 10:03 AM  Result Value Ref Range   WBC 4.2 3.4 - 10.8 x10E3/uL   RBC 3.83 (L) 4.14 - 5.80 x10E6/uL   Hemoglobin 12.1 (L) 13.0 - 17.7 g/dL   Hematocrit 11.9 (L) 14.7 - 51.0 %   MCV 96 79 - 97 fL   MCH 31.6 26.6 - 33.0 pg   MCHC 32.8 31.5 - 35.7 g/dL   RDW 82.9 56.2 - 13.0 %   Platelets 115 (L) 150 - 450 x10E3/uL   Neutrophils 60 Not Estab. %   Lymphs 19 Not Estab. %   Monocytes 13 Not Estab. %   Eos 7 Not Estab. %   Basos 1 Not Estab. %   Neutrophils Absolute 2.5 1.4 - 7.0 x10E3/uL   Lymphocytes Absolute 0.8 0.7 - 3.1 x10E3/uL   Monocytes Absolute 0.5 0.1 - 0.9 x10E3/uL   EOS (ABSOLUTE) 0.3 0.0 - 0.4 x10E3/uL   Basophils Absolute 0.1 0.0 - 0.2 x10E3/uL   Immature Granulocytes 0 Not Estab. %   Immature Grans (Abs) 0.0 0.0 - 0.1 x10E3/uL  Comprehensive metabolic panel     Status: Abnormal   Collection Time: 09/16/22 10:03 AM  Result Value Ref Range   Glucose 99 70 - 99 mg/dL   BUN 50 (H) 8 - 27 mg/dL   Creatinine, Ser 8.65 (H) 0.76 - 1.27 mg/dL   eGFR 18 (L) >78 IO/NGE/9.52   BUN/Creatinine Ratio 16 10 - 24   Sodium 140 134 - 144 mmol/L   Potassium 5.0 3.5 - 5.2 mmol/L   Chloride 96 96 - 106 mmol/L   CO2 28 20 - 29 mmol/L   Calcium 9.4 8.6 - 10.2 mg/dL   Total Protein 6.9 6.0 - 8.5 g/dL   Albumin 4.0 3.7 - 4.7 g/dL   Globulin, Total 2.9 1.5 - 4.5 g/dL   Albumin/Globulin Ratio 1.4 1.2 - 2.2   Bilirubin Total 0.7 0.0 - 1.2 mg/dL   Alkaline Phosphatase 303 (H) 44 - 121 IU/L   AST 345 (H) 0 - 40 IU/L   ALT 326 (H) 0 - 44 IU/L  Lipid Panel w/o Chol/HDL Ratio     Status: None   Collection Time: 09/16/22 10:03 AM  Result Value Ref Range   Cholesterol, Total 116 100 - 199  mg/dL   Triglycerides 48 0 - 149 mg/dL   HDL 63 >84 mg/dL   VLDL Cholesterol Cal 12 5 - 40 mg/dL   LDL Chol Calc (NIH) 41 0 - 99 mg/dL  Hgb X3K w/o eAG     Status: Abnormal   Collection Time: 09/16/22 10:03 AM  Result Value Ref Range   Hgb A1c MFr Bld 5.9 (H) 4.8 - 5.6 %    Comment:          Prediabetes: 5.7 - 6.4          Diabetes: >6.4  Glycemic control for adults with diabetes: <7.0   TSH     Status: None   Collection Time: 09/16/22 10:03 AM  Result Value Ref Range   TSH 2.690 0.450 - 4.500 uIU/mL      Assessment & Plan:   Problem List Items Addressed This Visit       Cardiovascular and Mediastinum   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 DW Cardiology suspension of Amiodarone  Return in about 2 weeks (around 10/05/2022).   Total time spent: 35 minutes  Luna Fuse, MD  09/21/2022

## 2022-09-22 DIAGNOSIS — Z992 Dependence on renal dialysis: Secondary | ICD-10-CM | POA: Diagnosis not present

## 2022-09-22 DIAGNOSIS — D509 Iron deficiency anemia, unspecified: Secondary | ICD-10-CM | POA: Diagnosis not present

## 2022-09-22 DIAGNOSIS — D631 Anemia in chronic kidney disease: Secondary | ICD-10-CM | POA: Diagnosis not present

## 2022-09-22 DIAGNOSIS — N250 Renal osteodystrophy: Secondary | ICD-10-CM | POA: Diagnosis not present

## 2022-09-22 DIAGNOSIS — N186 End stage renal disease: Secondary | ICD-10-CM | POA: Diagnosis not present

## 2022-09-23 ENCOUNTER — Ambulatory Visit (INDEPENDENT_AMBULATORY_CARE_PROVIDER_SITE_OTHER): Payer: Medicare Other | Admitting: Cardiovascular Disease

## 2022-09-23 ENCOUNTER — Encounter: Payer: Self-pay | Admitting: Cardiovascular Disease

## 2022-09-23 VITALS — BP 118/54 | HR 66 | Ht 72.0 in | Wt 205.8 lb

## 2022-09-23 DIAGNOSIS — E782 Mixed hyperlipidemia: Secondary | ICD-10-CM | POA: Diagnosis not present

## 2022-09-23 DIAGNOSIS — I480 Paroxysmal atrial fibrillation: Secondary | ICD-10-CM | POA: Diagnosis not present

## 2022-09-23 MED ORDER — SOTALOL HCL 80 MG PO TABS
80.0000 mg | ORAL_TABLET | Freq: Two times a day (BID) | ORAL | 4 refills | Status: DC
Start: 2022-09-23 — End: 2022-10-14

## 2022-09-23 NOTE — Progress Notes (Signed)
Cardiology Office Note   Date:  09/23/2022   ID:  Matthew [name], DOB [birth year], MRN [id number]  PCP:  Sherron Monday, MD  Cardiologist:  Adrian Blackwater, MD      History of Present Illness: Matthew [name] is a 89 y.o. male who presents for  Chief Complaint  Patient presents with   Follow-up    Follow up     Patient in office to follow up on abnormal LFTs. Amiodarone stopped earlier this week.     Past Medical History:  Diagnosis Date   Anginal pain (HCC)    Arthritis    Ascending aortic aneurysm (HCC) 07/18/2015   a.) TTE 07/18/2015: severe dilation of ascending aorta measuring 7.0 cm. b.) TTE 01/10/2018: aortic root 3.8 cm; ascending aorta 6.8 cm; refused surgical intervention/repair.   Atrial fibrillation (HCC)    a.) CHA2DS2-VASc = 6 (age x 2, HFrEF, HTN, previous MI, T2DM). b.) rate/rhythm maintained on amiodarone + metoprolol succinate; chronic antiplatelet therapy using clopidogrel   Bradycardia    Broken arm 05/2013   Left   Cardiac murmur    a.) RIGHT upper sternal border   Cardiomyopathy [facility])    Coronary artery disease    DOE (dyspnea on exertion)    ESRD (end stage renal disease) (HCC)    GERD (gastroesophageal reflux disease)    HFrEF (heart failure with reduced ejection fraction) (HCC)    a.) TTE 12/03/2013: mod dec LV function; EF 35-40%; severe inferolateral and inferior HK; LA dilated; G3DD; PASP 52 mmHg. b.) TTE 07/18/2015: mild LV dysfunction with mild LVH; EF 45%; mild BAE. c.) TTE 01/10/2018: mod LV dysfunction; EF 45%; diffuse HK   HLD (hyperlipidemia)    Hypertension    IDA (iron deficiency anemia)    Long term current use of antithrombotics/antiplatelets    a.) clopidogrel   Myocardial infarction (HCC) 2000   Peripheral edema    Pneumonia    S/P CABG x 4 2001   a.) LIMA-LAD, SVG-D1, SVG-OM1, SVG-OM2, SVG-PDA   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   left frontal scalp, EDC   Squamous cell carcinoma of skin 05/05/2020   left  temporal scalp, in situ, EDC 05/13/20   T2DM (type 2 diabetes mellitus) (HCC)    Valvular insufficiency 12/03/2013   a.) TTE 12/03/2013: EF 35-40%; mild AR/MR, b.) TTE 07/18/2015: EF 45%; mild AR/PR, mod TR, severe MR. c.) TTE 01/10/2018: mod AR/TR     Past Surgical History:  Procedure Laterality Date   A/V SHUNTOGRAM Left 04/01/2022   Procedure: A/V SHUNTOGRAM;  Surgeon: Annice Needy, MD;  Location: [facility] INVASIVE CV LAB;  Service: Cardiovascular;  Laterality: Left;   AV FISTULA PLACEMENT Left 04/15/2021   Procedure: INSERTION OF ARTERIOVENOUS (AV) GORE-TEX GRAFT ARM ( BRACHIAL AXILLARY);  Surgeon: Annice Needy, MD;  Location: [facility] ORS;  Service: Vascular;  Laterality: Left;   CATARACT EXTRACTION W/PHACO Left 07/26/2017   Procedure: CATARACT EXTRACTION PHACO AND INTRAOCULAR LENS PLACEMENT (IOC);  Surgeon: Galen Manila, MD;  Location: [facility] ORS;  Service: Ophthalmology;  Laterality: Left;  [place]   00:45.8 AP%  13.1 CDE  6.00 Fluid Pack Lot # [id number]   CATARACT EXTRACTION W/PHACO Right 08/17/2017   Procedure: CATARACT EXTRACTION PHACO AND INTRAOCULAR LENS PLACEMENT (IOC);  Surgeon: Galen Manila, MD;  Location: [facility] ORS;  Service: Ophthalmology;  Laterality: Right;  [place]  01:30 AP% 16.8 CDE 15.24 Fluid pack lot # [id number]   CHOLECYSTECTOMY     CORONARY ARTERY BYPASS GRAFT  N/A 2001   Procedure: 5v CABG (LIMA-LAD, SVG-D1, SVG-OM1, SVG-OM2, SVG-PDA)   DIALYSIS/PERMA CATHETER INSERTION N/A 02/09/2018   Procedure: DIALYSIS/PERMA CATHETER INSERTION;  Surgeon: Annice Needy, MD;  Location: [facility] INVASIVE CV LAB;  Service: Cardiovascular;  Laterality: N/A;   DIALYSIS/PERMA CATHETER REMOVAL N/A 06/25/2021   Procedure: DIALYSIS/PERMA CATHETER REMOVAL;  Surgeon: Annice Needy, MD;  Location: [facility] INVASIVE CV LAB;  Service: Cardiovascular;  Laterality: N/A;   FRACTURE SURGERY Left 2015   LEFT arm   INSERTION OF DIALYSIS CATHETER Right 11/03/2020   Procedure: INSERTION OF Perm Cath in the RIGHT  INTERNAL JUGULAR;  Surgeon: Annice Needy, MD;  Location: [facility] ORS;  Service: Vascular;  Laterality: Right;   LEFT HEART CATHETERIZATION WITH CORONARY/GRAFT ANGIOGRAM Left 12/04/2013   Procedure: LEFT HEART CATHETERIZATION WITH Isabel Caprice;  Surgeon: Marykay Lex, MD;  Location: [facility] CATH LAB;  Service: Cardiovascular;  Laterality: Left;   LEFT HEART CATHETERIZATION WITH CORONARY/GRAFT ANGIOGRAM Left 02/06/2009   Procedure: LEFT HEART CATHETERIZATION WITH CORONARY/GRAFT ANGIOGRAM; Location: [facility]; Surgeon: Despina Hick, MD   PERCUTANEOUS CORONARY STENT INTERVENTION (PCI-S) N/A 12/06/2013   Procedure: STAGED PERCUTANEOUS CORONARY STENT INTERVENTION (overlapping 4.0 x 38 mm (mid) and 4.0 x 28 mm (ostial) Promus Primier DES to SVG-RPDA graft);  Surgeon: Lesleigh Noe, MD;  Location: [facility] CATH LAB;  Service: Cardiovascular   REMOVAL OF A DIALYSIS CATHETER N/A 11/03/2020   Procedure: REMOVAL OF A DIALYSIS CATHETER;  Surgeon: Annice Needy, MD;  Location: [facility] ORS;  Service: Vascular;  Laterality: N/A;     Current Outpatient Medications  Medication Sig Dispense Refill   acyclovir ointment (ZOVIRAX) 5 % Apply 1 application topically daily as needed (shingles).     bisacodyl (DULCOLAX) 10 MG suppository Place 1 suppository (10 mg total) rectally daily as needed for moderate constipation. 12 suppository 0   cholecalciferol (VITAMIN D) 25 MCG (1000 UNIT) tablet Take 1,000 Units by mouth daily with breakfast.     clopidogrel (PLAVIX) 75 MG tablet TAKE 1 TABLET BY MOUTH ONCE  DAILY 90 tablet 1   ferrous sulfate 325 (65 FE) MG tablet Take 325 mg by mouth 2 (two) times daily with a meal.      fluticasone (FLONASE) 50 MCG/ACT nasal spray Place 2 sprays into both nostrils daily as needed for allergies or rhinitis.     furosemide (LASIX) 40 MG tablet Take 40 mg by mouth.     gabapentin (NEURONTIN) 300 MG capsule Take 1 capsule (300 mg total) by mouth at bedtime.     glipiZIDE (GLUCOTROL) 5 MG  tablet Take 2.5 mg by mouth daily at 12 noon.     levocetirizine (XYZAL) 5 MG tablet Take 10 mg by mouth every morning.      levothyroxine (SYNTHROID) 100 MCG tablet TAKE 1 TABLET BY MOUTH IN THE  MORNING 90 tablet 1   levothyroxine (SYNTHROID) 75 MCG tablet Take 1 tablet (75 mcg total) by mouth daily before breakfast.     midodrine (PROAMATINE) 5 MG tablet Take 1 tablet (5 mg total) by mouth 3 (three) times daily with meals. (Patient taking differently: Take 5 mg by mouth 3 (three) times daily as needed.)     multivitamin (RENA-VIT) TABS tablet Take 1 tablet by mouth daily.     nitroGLYCERIN (NITROSTAT) 0.4 MG SL tablet Place 0.4 mg under the tongue every 5 (five) minutes as needed for chest pain.     Omega-3 Fatty Acids (FISH OIL) 1000 MG CAPS Take 4,000 mg by  mouth daily with lunch.     polyethylene glycol (MIRALAX / GLYCOLAX) 17 g packet Take 17 g by mouth daily. 14 each 0   potassium chloride SA (KLOR-CON) 20 MEQ tablet Take 20 mEq by mouth daily.     QUEtiapine (SEROQUEL) 25 MG tablet TAKE 1 TABLET BY MOUTH AT  BEDTIME 90 tablet 0   senna-docusate (SENOKOT-S) 8.6-50 MG tablet Take 2 tablets by mouth 2 (two) times daily. 30 tablet 0   sotalol (BETAPACE) 80 MG tablet Take 1 tablet (80 mg total) by mouth 2 (two) times daily. 60 tablet 4   Suvorexant (BELSOMRA) 10 MG TABS Take 10 mg by mouth at bedtime.     traMADol (ULTRAM) 50 MG tablet Take 1 tablet (50 mg total) by mouth every 6 (six) hours as needed for moderate pain. 10 tablet 0   No current facility-administered medications for this visit.    Allergies:   Other and Amoxicillin    Social History:   reports that he quit smoking about 32 years ago. His smoking use included pipe and cigarettes. He has never used smokeless tobacco. He reports that he does not currently use alcohol after a past usage of about 1.0 standard drink of alcohol per week. He reports that he does not use drugs.   Family History:  family history includes Heart  attack (age of onset: 91) in his father; Heart failure in his mother.    ROS:     Review of Systems  Constitutional: Negative.   HENT: Negative.    Eyes: Negative.   Respiratory: Negative.    Cardiovascular: Negative.   Gastrointestinal: Negative.   Genitourinary: Negative.   Musculoskeletal: Negative.   Skin: Negative.   Neurological: Negative.   Endo/Heme/Allergies: Negative.   Psychiatric/Behavioral: Negative.    All other systems reviewed and are negative.   All other systems are reviewed and negative.   PHYSICAL EXAM: VS:  BP (!) 118/54   Pulse 66   Ht 6' (1.829 m)   Wt 205 lb 12.8 oz (93.4 kg)   SpO2 96%   BMI 27.91 kg/m  , BMI Body mass index is 27.91 kg/m. Last weight:  Wt Readings from Last 3 Encounters:  09/23/22 205 lb 12.8 oz (93.4 kg)  09/21/22 206 lb 12.8 oz (93.8 kg)  09/14/22 206 lb 12.8 oz (93.8 kg)   Physical Exam Vitals reviewed.  Constitutional:      Appearance: Normal appearance. He is normal weight.  HENT:     Head: Normocephalic.     Nose: Nose normal.     Mouth/Throat:     Mouth: Mucous membranes are moist.  Eyes:     Pupils: Pupils are equal, round, and reactive to light.  Cardiovascular:     Rate and Rhythm: Normal rate and regular rhythm.     Pulses: Normal pulses.     Heart sounds: Normal heart sounds.  Pulmonary:     Effort: Pulmonary effort is normal.  Abdominal:     General: Abdomen is flat. Bowel sounds are normal.  Musculoskeletal:        General: Normal range of motion.     Cervical back: Normal range of motion.  Skin:    General: Skin is warm.  Neurological:     General: No focal deficit present.     Mental Status: He is alert.  Psychiatric:        Mood and Affect: Mood normal.     EKG: sinus rhythm, HR 67 bpm  Recent  Labs: 09/16/2022: ALT 326; BUN 50; Creatinine, Ser 3.12; Hemoglobin 12.1; Platelets 115; Potassium 5.0; Sodium 140; TSH 2.690    Lipid Panel    Component Value Date/Time   CHOL 116 09/16/2022  1003   TRIG 48 09/16/2022 1003   HDL 63 09/16/2022 1003   LDLCALC 41 09/16/2022 1003    ASSESSMENT AND PLAN:    ICD-10-CM   1. Paroxysmal atrial fibrillation (HCC)  I48.0 sotalol (BETAPACE) 80 MG tablet    2. Mixed hyperlipidemia  E78.2        Problem List Items Addressed This Visit       Cardiovascular and Mediastinum   Atrial fibrillation (HCC) - Primary    Patient in sinus rhythm on EKG. Amiodarone stopped 2 days ago due to elevated LFTs. Stop metoprolol in one week. Add sotalol, start on 09/29/22.       Relevant Medications   sotalol (BETAPACE) 80 MG tablet     Other   HLD (hyperlipidemia)    Zetia and rosuvastatin stopped due to elevated LFTs. Will re-evaluate cholesterol and lipid lowering medications when LFTs normalize.      Relevant Medications   sotalol (BETAPACE) 80 MG tablet     Disposition:   Return in about 3 weeks (around 10/14/2022).    Total time spent: 30 minutes  Signed,  Adrian Blackwater, MD  09/23/2022 1:46 PM    [facility]

## 2022-09-23 NOTE — Patient Instructions (Addendum)
Start Sotalol on Wednesday, 5/8. Stop metoprolol on 5/8

## 2022-09-23 NOTE — Assessment & Plan Note (Signed)
Zetia and rosuvastatin stopped due to elevated LFTs. Will re-evaluate cholesterol and lipid lowering medications when LFTs normalize.

## 2022-09-23 NOTE — Assessment & Plan Note (Addendum)
Patient in sinus rhythm on EKG. Amiodarone stopped 2 days ago due to elevated LFTs. Stop metoprolol in one week. Add sotalol, start on 09/29/22.

## 2022-09-24 DIAGNOSIS — D631 Anemia in chronic kidney disease: Secondary | ICD-10-CM | POA: Diagnosis not present

## 2022-09-24 DIAGNOSIS — N186 End stage renal disease: Secondary | ICD-10-CM | POA: Diagnosis not present

## 2022-09-24 DIAGNOSIS — D509 Iron deficiency anemia, unspecified: Secondary | ICD-10-CM | POA: Diagnosis not present

## 2022-09-24 DIAGNOSIS — Z992 Dependence on renal dialysis: Secondary | ICD-10-CM | POA: Diagnosis not present

## 2022-09-24 DIAGNOSIS — N250 Renal osteodystrophy: Secondary | ICD-10-CM | POA: Diagnosis not present

## 2022-09-24 LAB — HEPATITIS B SURFACE ANTIGEN: Hepatitis B Surface Ag: NEGATIVE

## 2022-09-24 LAB — HEPATITIS C GENOTYPE

## 2022-09-24 LAB — HEPATIC FUNCTION PANEL
ALT: 252 IU/L — ABNORMAL HIGH (ref 0–44)
AST: 293 IU/L — ABNORMAL HIGH (ref 0–40)
Albumin: 4.2 g/dL (ref 3.7–4.7)
Alkaline Phosphatase: 340 IU/L — ABNORMAL HIGH (ref 44–121)
Bilirubin Total: 1.2 mg/dL (ref 0.0–1.2)
Bilirubin, Direct: 0.81 mg/dL — ABNORMAL HIGH (ref 0.00–0.40)
Total Protein: 6.8 g/dL (ref 6.0–8.5)

## 2022-09-27 DIAGNOSIS — D509 Iron deficiency anemia, unspecified: Secondary | ICD-10-CM | POA: Diagnosis not present

## 2022-09-27 DIAGNOSIS — D631 Anemia in chronic kidney disease: Secondary | ICD-10-CM | POA: Diagnosis not present

## 2022-09-27 DIAGNOSIS — N250 Renal osteodystrophy: Secondary | ICD-10-CM | POA: Diagnosis not present

## 2022-09-27 DIAGNOSIS — N186 End stage renal disease: Secondary | ICD-10-CM | POA: Diagnosis not present

## 2022-09-27 DIAGNOSIS — Z992 Dependence on renal dialysis: Secondary | ICD-10-CM | POA: Diagnosis not present

## 2022-09-29 ENCOUNTER — Other Ambulatory Visit: Payer: Medicare Other

## 2022-09-29 DIAGNOSIS — D631 Anemia in chronic kidney disease: Secondary | ICD-10-CM | POA: Diagnosis not present

## 2022-09-29 DIAGNOSIS — Z992 Dependence on renal dialysis: Secondary | ICD-10-CM | POA: Diagnosis not present

## 2022-09-29 DIAGNOSIS — N186 End stage renal disease: Secondary | ICD-10-CM | POA: Diagnosis not present

## 2022-09-29 DIAGNOSIS — N250 Renal osteodystrophy: Secondary | ICD-10-CM | POA: Diagnosis not present

## 2022-09-29 DIAGNOSIS — D509 Iron deficiency anemia, unspecified: Secondary | ICD-10-CM | POA: Diagnosis not present

## 2022-10-01 DIAGNOSIS — D509 Iron deficiency anemia, unspecified: Secondary | ICD-10-CM | POA: Diagnosis not present

## 2022-10-01 DIAGNOSIS — Z992 Dependence on renal dialysis: Secondary | ICD-10-CM | POA: Diagnosis not present

## 2022-10-01 DIAGNOSIS — N250 Renal osteodystrophy: Secondary | ICD-10-CM | POA: Diagnosis not present

## 2022-10-01 DIAGNOSIS — D631 Anemia in chronic kidney disease: Secondary | ICD-10-CM | POA: Diagnosis not present

## 2022-10-01 DIAGNOSIS — N186 End stage renal disease: Secondary | ICD-10-CM | POA: Diagnosis not present

## 2022-10-04 DIAGNOSIS — Z992 Dependence on renal dialysis: Secondary | ICD-10-CM | POA: Diagnosis not present

## 2022-10-04 DIAGNOSIS — N250 Renal osteodystrophy: Secondary | ICD-10-CM | POA: Diagnosis not present

## 2022-10-04 DIAGNOSIS — N186 End stage renal disease: Secondary | ICD-10-CM | POA: Diagnosis not present

## 2022-10-04 DIAGNOSIS — D509 Iron deficiency anemia, unspecified: Secondary | ICD-10-CM | POA: Diagnosis not present

## 2022-10-04 DIAGNOSIS — D631 Anemia in chronic kidney disease: Secondary | ICD-10-CM | POA: Diagnosis not present

## 2022-10-05 ENCOUNTER — Ambulatory Visit: Payer: Medicare Other | Admitting: Internal Medicine

## 2022-10-06 ENCOUNTER — Ambulatory Visit (INDEPENDENT_AMBULATORY_CARE_PROVIDER_SITE_OTHER): Payer: Medicare Other

## 2022-10-06 DIAGNOSIS — R748 Abnormal levels of other serum enzymes: Secondary | ICD-10-CM | POA: Diagnosis not present

## 2022-10-06 DIAGNOSIS — D631 Anemia in chronic kidney disease: Secondary | ICD-10-CM | POA: Diagnosis not present

## 2022-10-06 DIAGNOSIS — N250 Renal osteodystrophy: Secondary | ICD-10-CM | POA: Diagnosis not present

## 2022-10-06 DIAGNOSIS — Z992 Dependence on renal dialysis: Secondary | ICD-10-CM | POA: Diagnosis not present

## 2022-10-06 DIAGNOSIS — N186 End stage renal disease: Secondary | ICD-10-CM | POA: Diagnosis not present

## 2022-10-06 DIAGNOSIS — D509 Iron deficiency anemia, unspecified: Secondary | ICD-10-CM | POA: Diagnosis not present

## 2022-10-08 DIAGNOSIS — N186 End stage renal disease: Secondary | ICD-10-CM | POA: Diagnosis not present

## 2022-10-08 DIAGNOSIS — D509 Iron deficiency anemia, unspecified: Secondary | ICD-10-CM | POA: Diagnosis not present

## 2022-10-08 DIAGNOSIS — Z992 Dependence on renal dialysis: Secondary | ICD-10-CM | POA: Diagnosis not present

## 2022-10-08 DIAGNOSIS — N250 Renal osteodystrophy: Secondary | ICD-10-CM | POA: Diagnosis not present

## 2022-10-08 DIAGNOSIS — D631 Anemia in chronic kidney disease: Secondary | ICD-10-CM | POA: Diagnosis not present

## 2022-10-11 DIAGNOSIS — Z992 Dependence on renal dialysis: Secondary | ICD-10-CM | POA: Diagnosis not present

## 2022-10-11 DIAGNOSIS — D509 Iron deficiency anemia, unspecified: Secondary | ICD-10-CM | POA: Diagnosis not present

## 2022-10-11 DIAGNOSIS — N250 Renal osteodystrophy: Secondary | ICD-10-CM | POA: Diagnosis not present

## 2022-10-11 DIAGNOSIS — N186 End stage renal disease: Secondary | ICD-10-CM | POA: Diagnosis not present

## 2022-10-11 DIAGNOSIS — D631 Anemia in chronic kidney disease: Secondary | ICD-10-CM | POA: Diagnosis not present

## 2022-10-13 DIAGNOSIS — N250 Renal osteodystrophy: Secondary | ICD-10-CM | POA: Diagnosis not present

## 2022-10-13 DIAGNOSIS — Z992 Dependence on renal dialysis: Secondary | ICD-10-CM | POA: Diagnosis not present

## 2022-10-13 DIAGNOSIS — D631 Anemia in chronic kidney disease: Secondary | ICD-10-CM | POA: Diagnosis not present

## 2022-10-13 DIAGNOSIS — D509 Iron deficiency anemia, unspecified: Secondary | ICD-10-CM | POA: Diagnosis not present

## 2022-10-13 DIAGNOSIS — N186 End stage renal disease: Secondary | ICD-10-CM | POA: Diagnosis not present

## 2022-10-14 ENCOUNTER — Encounter: Payer: Self-pay | Admitting: Cardiovascular Disease

## 2022-10-14 ENCOUNTER — Ambulatory Visit (INDEPENDENT_AMBULATORY_CARE_PROVIDER_SITE_OTHER): Payer: Medicare Other | Admitting: Cardiovascular Disease

## 2022-10-14 VITALS — BP 128/60 | HR 66 | Ht 72.0 in | Wt 205.0 lb

## 2022-10-14 DIAGNOSIS — I25810 Atherosclerosis of coronary artery bypass graft(s) without angina pectoris: Secondary | ICD-10-CM

## 2022-10-14 DIAGNOSIS — I480 Paroxysmal atrial fibrillation: Secondary | ICD-10-CM | POA: Diagnosis not present

## 2022-10-14 MED ORDER — SOTALOL HCL 80 MG PO TABS: 80.0000 mg | ORAL_TABLET | Freq: Two times a day (BID) | ORAL | 1 refills | Status: DC

## 2022-10-14 NOTE — Assessment & Plan Note (Signed)
Patient in sinus rhythm, continue sotalol. Amiodarone stopped due to elevated LFTs.

## 2022-10-14 NOTE — Progress Notes (Signed)
Cardiology Office Note   Date:  10/14/2022   ID:  Matthew [name], DOB 03/15/1931, MRN [id number]  PCP:  Sherron Monday, MD  Cardiologist:  Adrian Blackwater, MD      History of Present Illness: Matthew [name] is a 89 y.o. male who presents for  Chief Complaint  Patient presents with   Follow-up    3 week follow up    Patient in office for 3 week follow up since changing amiodarone to sotalol.     Past Medical History:  Diagnosis Date   Anginal pain (HCC)    Arthritis    Ascending aortic aneurysm (HCC) 07/18/2015   a.) TTE 07/18/2015: severe dilation of ascending aorta measuring 7.0 cm. b.) TTE 01/10/2018: aortic root 3.8 cm; ascending aorta 6.8 cm; refused surgical intervention/repair.   Atrial fibrillation (HCC)    a.) CHA2DS2-VASc = 6 (age x 2, HFrEF, HTN, previous MI, T2DM). b.) rate/rhythm maintained on amiodarone + metoprolol succinate; chronic antiplatelet therapy using clopidogrel   Bradycardia    Broken arm 05/2013   Left   Cardiac murmur    a.) RIGHT upper sternal border   Cardiomyopathy [facility])    Coronary artery disease    DOE (dyspnea on exertion)    ESRD (end stage renal disease) (HCC)    GERD (gastroesophageal reflux disease)    HFrEF (heart failure with reduced ejection fraction) (HCC)    a.) TTE 12/03/2013: mod dec LV function; EF 35-40%; severe inferolateral and inferior HK; LA dilated; G3DD; PASP 52 mmHg. b.) TTE 07/18/2015: mild LV dysfunction with mild LVH; EF 45%; mild BAE. c.) TTE 01/10/2018: mod LV dysfunction; EF 45%; diffuse HK   HLD (hyperlipidemia)    Hypertension    IDA (iron deficiency anemia)    Long term current use of antithrombotics/antiplatelets    a.) clopidogrel   Myocardial infarction (HCC) 2000   Peripheral edema    Pneumonia    S/P CABG x 4 2001   a.) LIMA-LAD, SVG-D1, SVG-OM1, SVG-OM2, SVG-PDA   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   left frontal scalp, EDC   Squamous cell carcinoma of skin 05/05/2020   left  temporal scalp, in situ, EDC 05/13/20   T2DM (type 2 diabetes mellitus) (HCC)    Valvular insufficiency 12/03/2013   a.) TTE 12/03/2013: EF 35-40%; mild AR/MR, b.) TTE 07/18/2015: EF 45%; mild AR/PR, mod TR, severe MR. c.) TTE 01/10/2018: mod AR/TR     Past Surgical History:  Procedure Laterality Date   A/V SHUNTOGRAM Left 04/01/2022   Procedure: A/V SHUNTOGRAM;  Surgeon: Annice Needy, MD;  Location: [facility] INVASIVE CV LAB;  Service: Cardiovascular;  Laterality: Left;   AV FISTULA PLACEMENT Left 04/15/2021   Procedure: INSERTION OF ARTERIOVENOUS (AV) GORE-TEX GRAFT ARM ( BRACHIAL AXILLARY);  Surgeon: Annice Needy, MD;  Location: [facility] ORS;  Service: Vascular;  Laterality: Left;   CATARACT EXTRACTION W/PHACO Left 07/26/2017   Procedure: CATARACT EXTRACTION PHACO AND INTRAOCULAR LENS PLACEMENT (IOC);  Surgeon: Galen Manila, MD;  Location: [facility] ORS;  Service: Ophthalmology;  Laterality: Left;  [place]   00:45.8 AP%  13.1 CDE  6.00 Fluid Pack Lot # [id number]   CATARACT EXTRACTION W/PHACO Right 08/17/2017   Procedure: CATARACT EXTRACTION PHACO AND INTRAOCULAR LENS PLACEMENT (IOC);  Surgeon: Galen Manila, MD;  Location: [facility] ORS;  Service: Ophthalmology;  Laterality: Right;  [place]  01:30 AP% 16.8 CDE 15.24 Fluid pack lot # [id number]   CHOLECYSTECTOMY     CORONARY ARTERY BYPASS GRAFT  N/A 2001   Procedure: 5v CABG (LIMA-LAD, SVG-D1, SVG-OM1, SVG-OM2, SVG-PDA)   DIALYSIS/PERMA CATHETER INSERTION N/A 02/09/2018   Procedure: DIALYSIS/PERMA CATHETER INSERTION;  Surgeon: Annice Needy, MD;  Location: [facility] INVASIVE CV LAB;  Service: Cardiovascular;  Laterality: N/A;   DIALYSIS/PERMA CATHETER REMOVAL N/A 06/25/2021   Procedure: DIALYSIS/PERMA CATHETER REMOVAL;  Surgeon: Annice Needy, MD;  Location: [facility] INVASIVE CV LAB;  Service: Cardiovascular;  Laterality: N/A;   FRACTURE SURGERY Left 2015   LEFT arm   INSERTION OF DIALYSIS CATHETER Right 11/03/2020   Procedure: INSERTION OF Perm Cath in the RIGHT  INTERNAL JUGULAR;  Surgeon: Annice Needy, MD;  Location: [facility] ORS;  Service: Vascular;  Laterality: Right;   LEFT HEART CATHETERIZATION WITH CORONARY/GRAFT ANGIOGRAM Left 12/04/2013   Procedure: LEFT HEART CATHETERIZATION WITH Isabel Caprice;  Surgeon: Marykay Lex, MD;  Location: [facility] CATH LAB;  Service: Cardiovascular;  Laterality: Left;   LEFT HEART CATHETERIZATION WITH CORONARY/GRAFT ANGIOGRAM Left 02/06/2009   Procedure: LEFT HEART CATHETERIZATION WITH CORONARY/GRAFT ANGIOGRAM; Location: [facility]; Surgeon: Despina Hick, MD   PERCUTANEOUS CORONARY STENT INTERVENTION (PCI-S) N/A 12/06/2013   Procedure: STAGED PERCUTANEOUS CORONARY STENT INTERVENTION (overlapping 4.0 x 38 mm (mid) and 4.0 x 28 mm (ostial) Promus Primier DES to SVG-RPDA graft);  Surgeon: Lesleigh Noe, MD;  Location: [facility] CATH LAB;  Service: Cardiovascular   REMOVAL OF A DIALYSIS CATHETER N/A 11/03/2020   Procedure: REMOVAL OF A DIALYSIS CATHETER;  Surgeon: Annice Needy, MD;  Location: [facility] ORS;  Service: Vascular;  Laterality: N/A;     Current Outpatient Medications  Medication Sig Dispense Refill   acyclovir ointment (ZOVIRAX) 5 % Apply 1 application topically daily as needed (shingles).     bisacodyl (DULCOLAX) 10 MG suppository Place 1 suppository (10 mg total) rectally daily as needed for moderate constipation. 12 suppository 0   cholecalciferol (VITAMIN D) 25 MCG (1000 UNIT) tablet Take 1,000 Units by mouth daily with breakfast.     clopidogrel (PLAVIX) 75 MG tablet TAKE 1 TABLET BY MOUTH ONCE  DAILY 90 tablet 1   ferrous sulfate 325 (65 FE) MG tablet Take 325 mg by mouth 2 (two) times daily with a meal.      fluticasone (FLONASE) 50 MCG/ACT nasal spray Place 2 sprays into both nostrils daily as needed for allergies or rhinitis.     furosemide (LASIX) 40 MG tablet Take 40 mg by mouth.     gabapentin (NEURONTIN) 300 MG capsule Take 1 capsule (300 mg total) by mouth at bedtime.     glipiZIDE (GLUCOTROL) 5 MG  tablet Take 2.5 mg by mouth daily at 12 noon.     levocetirizine (XYZAL) 5 MG tablet Take 10 mg by mouth every morning.      levothyroxine (SYNTHROID) 100 MCG tablet TAKE 1 TABLET BY MOUTH IN THE  MORNING 90 tablet 1   levothyroxine (SYNTHROID) 75 MCG tablet Take 1 tablet (75 mcg total) by mouth daily before breakfast.     midodrine (PROAMATINE) 5 MG tablet Take 1 tablet (5 mg total) by mouth 3 (three) times daily with meals. (Patient taking differently: Take 5 mg by mouth 3 (three) times daily as needed.)     multivitamin (RENA-VIT) TABS tablet Take 1 tablet by mouth daily.     nitroGLYCERIN (NITROSTAT) 0.4 MG SL tablet Place 0.4 mg under the tongue every 5 (five) minutes as needed for chest pain.     Omega-3 Fatty Acids (FISH OIL) 1000 MG CAPS Take 4,000 mg by  mouth daily with lunch.     polyethylene glycol (MIRALAX / GLYCOLAX) 17 g packet Take 17 g by mouth daily. 14 each 0   potassium chloride SA (KLOR-CON) 20 MEQ tablet Take 20 mEq by mouth daily.     QUEtiapine (SEROQUEL) 25 MG tablet TAKE 1 TABLET BY MOUTH AT  BEDTIME 90 tablet 0   senna-docusate (SENOKOT-S) 8.6-50 MG tablet Take 2 tablets by mouth 2 (two) times daily. 30 tablet 0   Suvorexant (BELSOMRA) 10 MG TABS Take 10 mg by mouth at bedtime.     traMADol (ULTRAM) 50 MG tablet Take 1 tablet (50 mg total) by mouth every 6 (six) hours as needed for moderate pain. 10 tablet 0   sotalol (BETAPACE) 80 MG tablet Take 1 tablet (80 mg total) by mouth 2 (two) times daily. 180 tablet 1   No current facility-administered medications for this visit.    Allergies:   Other and Amoxicillin    Social History:   reports that he quit smoking about 32 years ago. His smoking use included pipe and cigarettes. He has never used smokeless tobacco. He reports that he does not currently use alcohol after a past usage of about 1.0 standard drink of alcohol per week. He reports that he does not use drugs.   Family History:  family history includes Heart  attack (age of onset: 21) in his father; Heart failure in his mother.    ROS:     Review of Systems  Constitutional: Negative.   HENT: Negative.    Eyes: Negative.   Respiratory: Negative.    Cardiovascular: Negative.   Gastrointestinal: Negative.   Genitourinary: Negative.   Musculoskeletal: Negative.   Skin: Negative.   Neurological: Negative.   Endo/Heme/Allergies: Negative.   Psychiatric/Behavioral: Negative.    All other systems reviewed and are negative.    All other systems are reviewed and negative.    PHYSICAL EXAM: VS:  BP 128/60   Pulse 66   Ht 6' (1.829 m)   Wt 205 lb (93 kg)   SpO2 99%   BMI 27.80 kg/m  , BMI Body mass index is 27.8 kg/m. Last weight:  Wt Readings from Last 3 Encounters:  10/14/22 205 lb (93 kg)  09/23/22 205 lb 12.8 oz (93.4 kg)  09/21/22 206 lb 12.8 oz (93.8 kg)     Physical Exam Vitals reviewed.  Constitutional:      Appearance: Normal appearance. He is normal weight.  HENT:     Head: Normocephalic.     Nose: Nose normal.     Mouth/Throat:     Mouth: Mucous membranes are moist.  Eyes:     Pupils: Pupils are equal, round, and reactive to light.  Cardiovascular:     Rate and Rhythm: Normal rate and regular rhythm.     Pulses: Normal pulses.     Heart sounds: Normal heart sounds.  Pulmonary:     Effort: Pulmonary effort is normal.  Abdominal:     General: Abdomen is flat. Bowel sounds are normal.  Musculoskeletal:        General: Normal range of motion.     Cervical back: Normal range of motion.  Skin:    General: Skin is warm.  Neurological:     General: No focal deficit present.     Mental Status: He is alert.  Psychiatric:        Mood and Affect: Mood normal.      EKG: none today  Recent Labs: 09/16/2022: BUN  50; Creatinine, Ser 3.12; Hemoglobin 12.1; Platelets 115; Potassium 5.0; Sodium 140; TSH 2.690 09/21/2022: ALT 252    Lipid Panel    Component Value Date/Time   CHOL 116 09/16/2022 1003   TRIG 48  09/16/2022 1003   HDL 63 09/16/2022 1003   LDLCALC 41 09/16/2022 1003     ASSESSMENT AND PLAN:    ICD-10-CM   1. Paroxysmal atrial fibrillation (HCC)  I48.0 sotalol (BETAPACE) 80 MG tablet    2. Coronary artery disease involving coronary bypass graft of native heart without angina pectoris  I25.810        Problem List Items Addressed This Visit       Cardiovascular and Mediastinum   CAD (coronary artery disease)    Statin stopped due to elevated LFTs. Will reconsider statin when LFTs have normalized.       Relevant Medications   sotalol (BETAPACE) 80 MG tablet   Atrial fibrillation (HCC) - Primary    Patient in sinus rhythm, continue sotalol. Amiodarone stopped due to elevated LFTs.      Relevant Medications   sotalol (BETAPACE) 80 MG tablet      Disposition:   Return in about 3 months (around 01/14/2023).    Total time spent: 30 minutes  Signed,  Adrian Blackwater, MD  10/14/2022 2:32 PM    [facility]

## 2022-10-14 NOTE — Assessment & Plan Note (Deleted)
Patient in sinus rhythm, continue sotalol.

## 2022-10-14 NOTE — Assessment & Plan Note (Signed)
Statin stopped due to elevated LFTs. Will reconsider statin when LFTs have normalized.

## 2022-10-15 ENCOUNTER — Ambulatory Visit (INDEPENDENT_AMBULATORY_CARE_PROVIDER_SITE_OTHER): Payer: Medicare Other | Admitting: Internal Medicine

## 2022-10-15 ENCOUNTER — Ambulatory Visit: Payer: Medicare Other | Admitting: Cardiovascular Disease

## 2022-10-15 VITALS — BP 88/50 | HR 65 | Ht 70.0 in | Wt 204.0 lb

## 2022-10-15 DIAGNOSIS — N3000 Acute cystitis without hematuria: Secondary | ICD-10-CM | POA: Diagnosis not present

## 2022-10-15 DIAGNOSIS — R8290 Unspecified abnormal findings in urine: Secondary | ICD-10-CM

## 2022-10-15 LAB — POCT URINALYSIS DIPSTICK
Bilirubin, UA: NEGATIVE
Glucose, UA: NEGATIVE
Ketones, UA: NEGATIVE
Nitrite, UA: NEGATIVE
Protein, UA: POSITIVE — AB
Spec Grav, UA: 1.01 (ref 1.010–1.025)
Urobilinogen, UA: 0.2 E.U./dL
pH, UA: 6.5 (ref 5.0–8.0)

## 2022-10-15 MED ORDER — CIPROFLOXACIN HCL 500 MG PO TABS
500.0000 mg | ORAL_TABLET | Freq: Two times a day (BID) | ORAL | 0 refills | Status: AC
Start: 2022-10-15 — End: 2022-10-20

## 2022-10-15 NOTE — Progress Notes (Signed)
Established Patient Office Visit  Subjective:  Patient ID: Matthew [name], male    DOB: Mar 06, 1931  Age: 89 y.o. MRN: [id number]  Chief Complaint  Patient presents with   Urinary Tract Infection    Possible UTI    SUBJECTIVE: Matthew [name] is a 89 y.o. male who complains of urinary frequency, urgency and dysuria x 1 days, without flank pain, fever, chills or bleeding.   OBJECTIVE: Appears well, in no apparent distress.  Vital signs are normal. The abdomen is soft without tenderness, guarding, mass, rebound or organomegaly. No CVA tenderness or inguinal adenopathy noted. Urine dipstick shows positive for WBC'Yehonatan Grandison.  Micro exam: not done.   ASSESSMENT: UTI uncomplicated without evidence of pyelonephritis  PLAN: Treatment per orders - also push fluids, may use Pyridium OTC prn. Call or return to clinic prn if these symptoms worsen or fail to improve as anticipated.     No other concerns at this time.   Past Medical History:  Diagnosis Date   Anginal pain (HCC)    Arthritis    Ascending aortic aneurysm (HCC) 07/18/2015   a.) TTE 07/18/2015: severe dilation of ascending aorta measuring 7.0 cm. b.) TTE 01/10/2018: aortic root 3.8 cm; ascending aorta 6.8 cm; refused surgical intervention/repair.   Atrial fibrillation (HCC)    a.) CHA2DS2-VASc = 6 (age x 2, HFrEF, HTN, previous MI, T2DM). b.) rate/rhythm maintained on amiodarone + metoprolol succinate; chronic antiplatelet therapy using clopidogrel   Bradycardia    Broken arm 05/2013   Left   Cardiac murmur    a.) RIGHT upper sternal border   Cardiomyopathy [facility])    Coronary artery disease    DOE (dyspnea on exertion)    ESRD (end stage renal disease) (HCC)    GERD (gastroesophageal reflux disease)    HFrEF (heart failure with reduced ejection fraction) (HCC)    a.) TTE 12/03/2013: mod dec LV function; EF 35-40%; severe inferolateral and inferior HK; LA dilated; G3DD; PASP 52 mmHg. b.) TTE 07/18/2015: mild LV dysfunction with mild  LVH; EF 45%; mild BAE. c.) TTE 01/10/2018: mod LV dysfunction; EF 45%; diffuse HK   HLD (hyperlipidemia)    Hypertension    IDA (iron deficiency anemia)    Long term current use of antithrombotics/antiplatelets    a.) clopidogrel   Myocardial infarction (HCC) 2000   Peripheral edema    Pneumonia    Jessina Marse/P CABG x 4 2001   a.) LIMA-LAD, SVG-D1, SVG-OM1, SVG-OM2, SVG-PDA   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   left frontal scalp, EDC   Squamous cell carcinoma of skin 05/05/2020   left temporal scalp, in situ, EDC 05/13/20   T2DM (type 2 diabetes mellitus) (HCC)    Valvular insufficiency 12/03/2013   a.) TTE 12/03/2013: EF 35-40%; mild AR/MR, b.) TTE 07/18/2015: EF 45%; mild AR/PR, mod TR, severe MR. c.) TTE 01/10/2018: mod AR/TR    Past Surgical History:  Procedure Laterality Date   A/V SHUNTOGRAM Left 04/01/2022   Procedure: A/V SHUNTOGRAM;  Surgeon: Annice Needy, MD;  Location: [facility] INVASIVE CV LAB;  Service: Cardiovascular;  Laterality: Left;   AV FISTULA PLACEMENT Left 04/15/2021   Procedure: INSERTION OF ARTERIOVENOUS (AV) GORE-TEX GRAFT ARM ( BRACHIAL AXILLARY);  Surgeon: Annice Needy, MD;  Location: [facility] ORS;  Service: Vascular;  Laterality: Left;   CATARACT EXTRACTION W/PHACO Left 07/26/2017   Procedure: CATARACT EXTRACTION PHACO AND INTRAOCULAR LENS PLACEMENT (IOC);  Surgeon: Matthew Manila, MD;  Location: [facility] ORS;  Service: Ophthalmology;  Laterality:  Left;  [place]   00:45.8 AP%  13.1 CDE  6.00 Fluid Pack Lot # [id number]   CATARACT EXTRACTION W/PHACO Right 08/17/2017   Procedure: CATARACT EXTRACTION PHACO AND INTRAOCULAR LENS PLACEMENT (IOC);  Surgeon: Matthew Manila, MD;  Location: [facility] ORS;  Service: Ophthalmology;  Laterality: Right;  [place]  01:30 AP% 16.8 CDE 15.24 Fluid pack lot # [id number]   CHOLECYSTECTOMY     CORONARY ARTERY BYPASS GRAFT N/A 2001   Procedure: 5v CABG (LIMA-LAD, SVG-D1, SVG-OM1, SVG-OM2, SVG-PDA)   DIALYSIS/PERMA CATHETER INSERTION N/A 02/09/2018    Procedure: DIALYSIS/PERMA CATHETER INSERTION;  Surgeon: Annice Needy, MD;  Location: [facility] INVASIVE CV LAB;  Service: Cardiovascular;  Laterality: N/A;   DIALYSIS/PERMA CATHETER REMOVAL N/A 06/25/2021   Procedure: DIALYSIS/PERMA CATHETER REMOVAL;  Surgeon: Annice Needy, MD;  Location: [facility] INVASIVE CV LAB;  Service: Cardiovascular;  Laterality: N/A;   FRACTURE SURGERY Left 2015   LEFT arm   INSERTION OF DIALYSIS CATHETER Right 11/03/2020   Procedure: INSERTION OF Perm Cath in the RIGHT INTERNAL JUGULAR;  Surgeon: Annice Needy, MD;  Location: [facility] ORS;  Service: Vascular;  Laterality: Right;   LEFT HEART CATHETERIZATION WITH CORONARY/GRAFT ANGIOGRAM Left 12/04/2013   Procedure: LEFT HEART CATHETERIZATION WITH Isabel Caprice;  Surgeon: Matthew Lex, MD;  Location: [facility] CATH LAB;  Service: Cardiovascular;  Laterality: Left;   LEFT HEART CATHETERIZATION WITH CORONARY/GRAFT ANGIOGRAM Left 02/06/2009   Procedure: LEFT HEART CATHETERIZATION WITH CORONARY/GRAFT ANGIOGRAM; Location: [facility]; Surgeon: Matthew Hick, MD   PERCUTANEOUS CORONARY STENT INTERVENTION (PCI-Matthew [name]) N/A 12/06/2013   Procedure: STAGED PERCUTANEOUS CORONARY STENT INTERVENTION (overlapping 4.0 x 38 mm (mid) and 4.0 x 28 mm (ostial) Promus Primier DES to SVG-RPDA graft);  Surgeon: Lesleigh Noe, MD;  Location: [facility] CATH LAB;  Service: Cardiovascular   REMOVAL OF A DIALYSIS CATHETER N/A 11/03/2020   Procedure: REMOVAL OF A DIALYSIS CATHETER;  Surgeon: Annice Needy, MD;  Location: [facility] ORS;  Service: Vascular;  Laterality: N/A;    Social History   Socioeconomic History   Marital status: Widowed    Spouse name: Not on file   Number of children: Not on file   Years of education: Not on file   Highest education level: Not on file  Occupational History   Not on file  Tobacco Use   Smoking status: Former    Types: Pipe, Cigarettes    Quit date: 76    Years since quitting: 32.4   Smokeless tobacco: Never  Vaping Use   Vaping  Use: Never used  Substance and Sexual Activity   Alcohol use: Not Currently    Alcohol/week: 1.0 standard drink of alcohol    Types: 1 Cans of beer per week    Comment: occasionally drinking only,once a month   Drug use: No   Sexual activity: Never  Other Topics Concern   Not on file  Social History Narrative   Lives with daughter and son in law   Social Determinants of Health   Financial Resource Strain: Low Risk  (01/10/2018)   Overall Financial Resource Strain (CARDIA)    Difficulty of Paying Living Expenses: Not hard at all  Food Insecurity: No Food Insecurity (01/10/2018)   Hunger Vital Sign    Worried About Running Out of Food in the Last Year: Never true    Ran Out of Food in the Last Year: Never true  Transportation Needs: No Transportation Needs (01/10/2018)   PRAPARE - Administrator, Civil Service (Medical): No  Lack of Transportation (Non-Medical): No  Physical Activity: Inactive (01/10/2018)   Exercise Vital Sign    Days of Exercise per Week: 0 days    Minutes of Exercise per Session: 0 min  Stress: No Stress Concern Present (01/10/2018)   [employer] of Occupational Health - Occupational Stress Questionnaire    Feeling of Stress : Not at all  Social Connections: Socially Integrated (01/10/2018)   Social Connection and Isolation Panel [NHANES]    Frequency of Communication with Friends and Family: More than three times a week    Frequency of Social Gatherings with Friends and Family: More than three times a week    Attends Religious Services: More than 4 times per year    Active Member of [employer] or Organizations: Yes    Attends Engineer, structural: More than 4 times per year    Marital Status: Married  Catering manager Violence: Not At Risk (01/10/2018)   Humiliation, Afraid, Rape, and Kick questionnaire    Fear of Current or Ex-Partner: No    Emotionally Abused: No    Physically Abused: No    Sexually Abused: No    Family History   Problem Relation Age of Onset   Heart attack Father 2       died first MI at age 43   Heart failure Mother     Allergies  Allergen Reactions   Other     Other reaction(Vangie Henthorn): Myalgias (intolerance), Other (See Comments) Statin Drugs  Statin Drugs     Amoxicillin Diarrhea    ROS     Objective:   BP (!) 88/50   Pulse 65   Ht 5\' 10"  (1.778 m)   Wt 204 lb (92.5 kg)   SpO2 98%   BMI 29.27 kg/m   Vitals:   10/15/22 1151  BP: (!) 88/50  Pulse: 65  Height: 5\' 10"  (1.778 m)  Weight: 204 lb (92.5 kg)  SpO2: 98%  BMI (Calculated): 29.27    Physical Exam   Results for orders placed or performed in visit on 10/15/22  POCT Urinalysis Dipstick (81002)  Result Value Ref Range   Color, UA     Clarity, UA     Glucose, UA Negative Negative   Bilirubin, UA Negative    Ketones, UA Negative    Spec Grav, UA 1.010 1.010 - 1.025   Blood, UA 3+    pH, UA 6.5 5.0 - 8.0   Protein, UA Positive (A) Negative   Urobilinogen, UA 0.2 0.2 or 1.0 E.U./dL   Nitrite, UA Negative    Leukocytes, UA Large (3+) (A) Negative   Appearance     Odor      Recent Results (from the past 2160 hour(Md Smola))  CBC with Differential/Platelet     Status: Abnormal   Collection Time: 09/16/22 10:03 AM  Result Value Ref Range   WBC 4.2 3.4 - 10.8 x10E3/uL   RBC 3.83 (L) 4.14 - 5.80 x10E6/uL   Hemoglobin 12.1 (L) 13.0 - 17.7 g/dL   Hematocrit 16.1 (L) 09.6 - 51.0 %   MCV 96 79 - 97 fL   MCH 31.6 26.6 - 33.0 pg   MCHC 32.8 31.5 - 35.7 g/dL   RDW 04.5 40.9 - 81.1 %   Platelets 115 (L) 150 - 450 x10E3/uL   Neutrophils 60 Not Estab. %   Lymphs 19 Not Estab. %   Monocytes 13 Not Estab. %   Eos 7 Not Estab. %   Basos 1 Not Estab. %  Neutrophils Absolute 2.5 1.4 - 7.0 x10E3/uL   Lymphocytes Absolute 0.8 0.7 - 3.1 x10E3/uL   Monocytes Absolute 0.5 0.1 - 0.9 x10E3/uL   EOS (ABSOLUTE) 0.3 0.0 - 0.4 x10E3/uL   Basophils Absolute 0.1 0.0 - 0.2 x10E3/uL   Immature Granulocytes 0 Not Estab. %   Immature  Grans (Abs) 0.0 0.0 - 0.1 x10E3/uL  Comprehensive metabolic panel     Status: Abnormal   Collection Time: 09/16/22 10:03 AM  Result Value Ref Range   Glucose 99 70 - 99 mg/dL   BUN 50 (H) 8 - 27 mg/dL   Creatinine, Ser 1.47 (H) 0.76 - 1.27 mg/dL   eGFR 18 (L) >82 NF/AOZ/3.08   BUN/Creatinine Ratio 16 10 - 24   Sodium 140 134 - 144 mmol/L   Potassium 5.0 3.5 - 5.2 mmol/L   Chloride 96 96 - 106 mmol/L   CO2 28 20 - 29 mmol/L   Calcium 9.4 8.6 - 10.2 mg/dL   Total Protein 6.9 6.0 - 8.5 g/dL   Albumin 4.0 3.7 - 4.7 g/dL   Globulin, Total 2.9 1.5 - 4.5 g/dL   Albumin/Globulin Ratio 1.4 1.2 - 2.2   Bilirubin Total 0.7 0.0 - 1.2 mg/dL   Alkaline Phosphatase 303 (H) 44 - 121 IU/L   AST 345 (H) 0 - 40 IU/L   ALT 326 (H) 0 - 44 IU/L  Lipid Panel w/o Chol/HDL Ratio     Status: None   Collection Time: 09/16/22 10:03 AM  Result Value Ref Range   Cholesterol, Total 116 100 - 199 mg/dL   Triglycerides 48 0 - 149 mg/dL   HDL 63 >65 mg/dL   VLDL Cholesterol Cal 12 5 - 40 mg/dL   LDL Chol Calc (NIH) 41 0 - 99 mg/dL  Hgb H8I w/o eAG     Status: Abnormal   Collection Time: 09/16/22 10:03 AM  Result Value Ref Range   Hgb A1c MFr Bld 5.9 (H) 4.8 - 5.6 %    Comment:          Prediabetes: 5.7 - 6.4          Diabetes: >6.4          Glycemic control for adults with diabetes: <7.0   TSH     Status: None   Collection Time: 09/16/22 10:03 AM  Result Value Ref Range   TSH 2.690 0.450 - 4.500 uIU/mL  Hepatic function panel     Status: Abnormal   Collection Time: 09/21/22  1:46 PM  Result Value Ref Range   Total Protein 6.8 6.0 - 8.5 g/dL   Albumin 4.2 3.7 - 4.7 g/dL   Bilirubin Total 1.2 0.0 - 1.2 mg/dL   Bilirubin, Direct 6.96 (H) 0.00 - 0.40 mg/dL   Alkaline Phosphatase 340 (H) 44 - 121 IU/L   AST 293 (H) 0 - 40 IU/L   ALT 252 (H) 0 - 44 IU/L  Hepatitis B Surface Antigen     Status: None   Collection Time: 09/21/22  1:46 PM  Result Value Ref Range   Hepatitis B Surface Ag Negative Negative   Hepatitis C genotype     Status: None   Collection Time: 09/21/22  1:46 PM  Result Value Ref Range   Hepatitis C Genotype CANCELED     Comment: Test not performed. Unable to provide an HCV genotype for this sample. The most common reason an HCV genotype cannot be determined is due to a viral load of <1,000 IU/mL, or more  rarely, the presence of an untypable HCV genotype.  Result canceled by the ancillary.   POCT Urinalysis Dipstick (16109)     Status: Abnormal   Collection Time: 10/15/22 12:02 PM  Result Value Ref Range   Color, UA     Clarity, UA     Glucose, UA Negative Negative   Bilirubin, UA Negative    Ketones, UA Negative    Spec Grav, UA 1.010 1.010 - 1.025   Blood, UA 3+    pH, UA 6.5 5.0 - 8.0   Protein, UA Positive (A) Negative   Urobilinogen, UA 0.2 0.2 or 1.0 E.U./dL   Nitrite, UA Negative    Leukocytes, UA Large (3+) (A) Negative   Appearance     Odor        Assessment & Plan:   Problem List Items Addressed This Visit   None Visit Diagnoses     Abnormal urine    -  Primary   Relevant Orders   POCT Urinalysis Dipstick (60454) (Completed)       No follow-ups on file.   Total time spent: 20 minutes  Luna Fuse, MD  10/15/2022   This document may have been prepared by [facility] Voice Recognition software and as such may include unintentional dictation errors.

## 2022-10-16 DIAGNOSIS — D509 Iron deficiency anemia, unspecified: Secondary | ICD-10-CM | POA: Diagnosis not present

## 2022-10-16 DIAGNOSIS — Z992 Dependence on renal dialysis: Secondary | ICD-10-CM | POA: Diagnosis not present

## 2022-10-16 DIAGNOSIS — N250 Renal osteodystrophy: Secondary | ICD-10-CM | POA: Diagnosis not present

## 2022-10-16 DIAGNOSIS — D631 Anemia in chronic kidney disease: Secondary | ICD-10-CM | POA: Diagnosis not present

## 2022-10-16 DIAGNOSIS — N186 End stage renal disease: Secondary | ICD-10-CM | POA: Diagnosis not present

## 2022-10-18 DIAGNOSIS — N250 Renal osteodystrophy: Secondary | ICD-10-CM | POA: Diagnosis not present

## 2022-10-18 DIAGNOSIS — N186 End stage renal disease: Secondary | ICD-10-CM | POA: Diagnosis not present

## 2022-10-18 DIAGNOSIS — D631 Anemia in chronic kidney disease: Secondary | ICD-10-CM | POA: Diagnosis not present

## 2022-10-18 DIAGNOSIS — Z992 Dependence on renal dialysis: Secondary | ICD-10-CM | POA: Diagnosis not present

## 2022-10-18 DIAGNOSIS — D509 Iron deficiency anemia, unspecified: Secondary | ICD-10-CM | POA: Diagnosis not present

## 2022-10-19 LAB — URINE CULTURE

## 2022-10-20 DIAGNOSIS — D631 Anemia in chronic kidney disease: Secondary | ICD-10-CM | POA: Diagnosis not present

## 2022-10-20 DIAGNOSIS — N186 End stage renal disease: Secondary | ICD-10-CM | POA: Diagnosis not present

## 2022-10-20 DIAGNOSIS — Z992 Dependence on renal dialysis: Secondary | ICD-10-CM | POA: Diagnosis not present

## 2022-10-20 DIAGNOSIS — D509 Iron deficiency anemia, unspecified: Secondary | ICD-10-CM | POA: Diagnosis not present

## 2022-10-20 DIAGNOSIS — N250 Renal osteodystrophy: Secondary | ICD-10-CM | POA: Diagnosis not present

## 2022-10-21 ENCOUNTER — Other Ambulatory Visit (INDEPENDENT_AMBULATORY_CARE_PROVIDER_SITE_OTHER): Payer: Self-pay | Admitting: Nurse Practitioner

## 2022-10-21 DIAGNOSIS — N186 End stage renal disease: Secondary | ICD-10-CM

## 2022-10-22 DIAGNOSIS — N250 Renal osteodystrophy: Secondary | ICD-10-CM | POA: Diagnosis not present

## 2022-10-22 DIAGNOSIS — D631 Anemia in chronic kidney disease: Secondary | ICD-10-CM | POA: Diagnosis not present

## 2022-10-22 DIAGNOSIS — D509 Iron deficiency anemia, unspecified: Secondary | ICD-10-CM | POA: Diagnosis not present

## 2022-10-22 DIAGNOSIS — N186 End stage renal disease: Secondary | ICD-10-CM | POA: Diagnosis not present

## 2022-10-22 DIAGNOSIS — Z992 Dependence on renal dialysis: Secondary | ICD-10-CM | POA: Diagnosis not present

## 2022-10-25 DIAGNOSIS — D509 Iron deficiency anemia, unspecified: Secondary | ICD-10-CM | POA: Diagnosis not present

## 2022-10-25 DIAGNOSIS — N250 Renal osteodystrophy: Secondary | ICD-10-CM | POA: Diagnosis not present

## 2022-10-25 DIAGNOSIS — Z992 Dependence on renal dialysis: Secondary | ICD-10-CM | POA: Diagnosis not present

## 2022-10-25 DIAGNOSIS — D631 Anemia in chronic kidney disease: Secondary | ICD-10-CM | POA: Diagnosis not present

## 2022-10-25 DIAGNOSIS — N186 End stage renal disease: Secondary | ICD-10-CM | POA: Diagnosis not present

## 2022-10-25 DIAGNOSIS — E119 Type 2 diabetes mellitus without complications: Secondary | ICD-10-CM | POA: Diagnosis not present

## 2022-10-26 ENCOUNTER — Encounter (INDEPENDENT_AMBULATORY_CARE_PROVIDER_SITE_OTHER): Payer: Self-pay | Admitting: Vascular Surgery

## 2022-10-26 ENCOUNTER — Ambulatory Visit (INDEPENDENT_AMBULATORY_CARE_PROVIDER_SITE_OTHER): Payer: Medicare Other

## 2022-10-26 ENCOUNTER — Ambulatory Visit (INDEPENDENT_AMBULATORY_CARE_PROVIDER_SITE_OTHER): Payer: Medicare Other | Admitting: Vascular Surgery

## 2022-10-26 VITALS — BP 119/68 | HR 57 | Resp 18 | Ht 72.0 in | Wt 204.0 lb

## 2022-10-26 DIAGNOSIS — N186 End stage renal disease: Secondary | ICD-10-CM | POA: Diagnosis not present

## 2022-10-26 DIAGNOSIS — I959 Hypotension, unspecified: Secondary | ICD-10-CM

## 2022-10-26 DIAGNOSIS — I120 Hypertensive chronic kidney disease with stage 5 chronic kidney disease or end stage renal disease: Secondary | ICD-10-CM | POA: Diagnosis not present

## 2022-10-26 DIAGNOSIS — I10 Essential (primary) hypertension: Secondary | ICD-10-CM | POA: Diagnosis not present

## 2022-10-26 DIAGNOSIS — Z992 Dependence on renal dialysis: Secondary | ICD-10-CM

## 2022-10-26 DIAGNOSIS — E1122 Type 2 diabetes mellitus with diabetic chronic kidney disease: Secondary | ICD-10-CM | POA: Diagnosis not present

## 2022-10-26 NOTE — Assessment & Plan Note (Signed)
His duplex today shows a widely patent left brachial artery to axillary vein AV graft.  Continue to use the graft as current.  Support blood pressure as necessary.  Follow-up in 6 months with duplex.

## 2022-10-26 NOTE — Assessment & Plan Note (Signed)
Previously had hypertension before starting dialysis.  Now has low blood pressure and is on midodrine

## 2022-10-26 NOTE — Progress Notes (Signed)
MRN : [id number]  Matthew [name] is a 89 y.o. ([birth year]) male who presents with chief complaint of  Chief Complaint  Patient presents with   Follow-up    f/u in 6 months with HDA  .  History of Present Illness: Patient returns today in follow up of his dialysis access.  He has a left brachial artery to axillary vein AV graft.  This is had a previous venous anastomotic intervention.  This was months ago.  He is doing well.  They are currently using his access without any difficulties.  He said they pulled clots 1 time but his blood pressure was running very low.  He is on midodrine for hypotension.  His duplex today shows a widely patent left brachial artery to axillary vein AV graft.  Current Outpatient Medications  Medication Sig Dispense Refill   acyclovir ointment (ZOVIRAX) 5 % Apply 1 application topically daily as needed (shingles).     bisacodyl (DULCOLAX) 10 MG suppository Place 1 suppository (10 mg total) rectally daily as needed for moderate constipation. 12 suppository 0   cholecalciferol (VITAMIN D) 25 MCG (1000 UNIT) tablet Take 1,000 Units by mouth daily with breakfast.     clopidogrel (PLAVIX) 75 MG tablet TAKE 1 TABLET BY MOUTH ONCE  DAILY 90 tablet 1   ferrous sulfate 325 (65 FE) MG tablet Take 325 mg by mouth 2 (two) times daily with a meal.      fluticasone (FLONASE) 50 MCG/ACT nasal spray Place 2 sprays into both nostrils daily as needed for allergies or rhinitis.     furosemide (LASIX) 40 MG tablet Take 40 mg by mouth.     gabapentin (NEURONTIN) 300 MG capsule Take 1 capsule (300 mg total) by mouth at bedtime.     glipiZIDE (GLUCOTROL) 5 MG tablet Take 2.5 mg by mouth daily at 12 noon.     levocetirizine (XYZAL) 5 MG tablet Take 10 mg by mouth every morning.      levothyroxine (SYNTHROID) 100 MCG tablet TAKE 1 TABLET BY MOUTH IN THE  MORNING 90 tablet 1   levothyroxine (SYNTHROID) 75 MCG tablet Take 1 tablet (75 mcg total) by mouth daily before breakfast.      midodrine (PROAMATINE) 5 MG tablet Take 1 tablet (5 mg total) by mouth 3 (three) times daily with meals. (Patient taking differently: Take 5 mg by mouth 3 (three) times daily as needed.)     multivitamin (RENA-VIT) TABS tablet Take 1 tablet by mouth daily.     nitroGLYCERIN (NITROSTAT) 0.4 MG SL tablet Place 0.4 mg under the tongue every 5 (five) minutes as needed for chest pain.     Omega-3 Fatty Acids (FISH OIL) 1000 MG CAPS Take 4,000 mg by mouth daily with lunch.     polyethylene glycol (MIRALAX / GLYCOLAX) 17 g packet Take 17 g by mouth daily. 14 each 0   potassium chloride SA (KLOR-CON) 20 MEQ tablet Take 20 mEq by mouth daily.     QUEtiapine (SEROQUEL) 25 MG tablet TAKE 1 TABLET BY MOUTH AT  BEDTIME 90 tablet 0   senna-docusate (SENOKOT-S) 8.6-50 MG tablet Take 2 tablets by mouth 2 (two) times daily. 30 tablet 0   sotalol (BETAPACE) 80 MG tablet Take 1 tablet (80 mg total) by mouth 2 (two) times daily. 180 tablet 1   Suvorexant (BELSOMRA) 10 MG TABS Take 10 mg by mouth at bedtime.     traMADol (ULTRAM) 50 MG tablet Take 1 tablet (50 mg total) by  mouth every 6 (six) hours as needed for moderate pain. 10 tablet 0   No current facility-administered medications for this visit.    Past Medical History:  Diagnosis Date   Anginal pain (HCC)    Arthritis    Ascending aortic aneurysm (HCC) 07/18/2015   a.) TTE 07/18/2015: severe dilation of ascending aorta measuring 7.0 cm. b.) TTE 01/10/2018: aortic root 3.8 cm; ascending aorta 6.8 cm; refused surgical intervention/repair.   Atrial fibrillation (HCC)    a.) CHA2DS2-VASc = 6 (age x 2, HFrEF, HTN, previous MI, T2DM). b.) rate/rhythm maintained on amiodarone + metoprolol succinate; chronic antiplatelet therapy using clopidogrel   Bradycardia    Broken arm 05/2013   Left   Cardiac murmur    a.) RIGHT upper sternal border   Cardiomyopathy [facility])    Coronary artery disease    DOE (dyspnea on exertion)    ESRD (end stage renal disease) (HCC)     GERD (gastroesophageal reflux disease)    HFrEF (heart failure with reduced ejection fraction) (HCC)    a.) TTE 12/03/2013: mod dec LV function; EF 35-40%; severe inferolateral and inferior HK; LA dilated; G3DD; PASP 52 mmHg. b.) TTE 07/18/2015: mild LV dysfunction with mild LVH; EF 45%; mild BAE. c.) TTE 01/10/2018: mod LV dysfunction; EF 45%; diffuse HK   HLD (hyperlipidemia)    Hypertension    IDA (iron deficiency anemia)    Long term current use of antithrombotics/antiplatelets    a.) clopidogrel   Myocardial infarction (HCC) 2000   Peripheral edema    Pneumonia    S/P CABG x 4 2001   a.) LIMA-LAD, SVG-D1, SVG-OM1, SVG-OM2, SVG-PDA   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   left frontal scalp, EDC   Squamous cell carcinoma of skin 05/05/2020   left temporal scalp, in situ, EDC 05/13/20   T2DM (type 2 diabetes mellitus) (HCC)    Valvular insufficiency 12/03/2013   a.) TTE 12/03/2013: EF 35-40%; mild AR/MR, b.) TTE 07/18/2015: EF 45%; mild AR/PR, mod TR, severe MR. c.) TTE 01/10/2018: mod AR/TR    Past Surgical History:  Procedure Laterality Date   A/V SHUNTOGRAM Left 04/01/2022   Procedure: A/V SHUNTOGRAM;  Surgeon: Annice Needy, MD;  Location: [facility] INVASIVE CV LAB;  Service: Cardiovascular;  Laterality: Left;   AV FISTULA PLACEMENT Left 04/15/2021   Procedure: INSERTION OF ARTERIOVENOUS (AV) GORE-TEX GRAFT ARM ( BRACHIAL AXILLARY);  Surgeon: Annice Needy, MD;  Location: [facility] ORS;  Service: Vascular;  Laterality: Left;   CATARACT EXTRACTION W/PHACO Left 07/26/2017   Procedure: CATARACT EXTRACTION PHACO AND INTRAOCULAR LENS PLACEMENT (IOC);  Surgeon: Galen Manila, MD;  Location: [facility] ORS;  Service: Ophthalmology;  Laterality: Left;  [place]   00:45.8 AP%  13.1 CDE  6.00 Fluid Pack Lot # [id number]   CATARACT EXTRACTION W/PHACO Right 08/17/2017   Procedure: CATARACT EXTRACTION PHACO AND INTRAOCULAR LENS PLACEMENT (IOC);  Surgeon: Galen Manila, MD;  Location: [facility] ORS;   Service: Ophthalmology;  Laterality: Right;  [place]  01:30 AP% 16.8 CDE 15.24 Fluid pack lot # [id number]   CHOLECYSTECTOMY     CORONARY ARTERY BYPASS GRAFT N/A 2001   Procedure: 5v CABG (LIMA-LAD, SVG-D1, SVG-OM1, SVG-OM2, SVG-PDA)   DIALYSIS/PERMA CATHETER INSERTION N/A 02/09/2018   Procedure: DIALYSIS/PERMA CATHETER INSERTION;  Surgeon: Annice Needy, MD;  Location: [facility] INVASIVE CV LAB;  Service: Cardiovascular;  Laterality: N/A;   DIALYSIS/PERMA CATHETER REMOVAL N/A 06/25/2021   Procedure: DIALYSIS/PERMA CATHETER REMOVAL;  Surgeon: Annice Needy, MD;  Location: [facility] INVASIVE  CV LAB;  Service: Cardiovascular;  Laterality: N/A;   FRACTURE SURGERY Left 2015   LEFT arm   INSERTION OF DIALYSIS CATHETER Right 11/03/2020   Procedure: INSERTION OF Perm Cath in the RIGHT INTERNAL JUGULAR;  Surgeon: Annice Needy, MD;  Location: [facility] ORS;  Service: Vascular;  Laterality: Right;   LEFT HEART CATHETERIZATION WITH CORONARY/GRAFT ANGIOGRAM Left 12/04/2013   Procedure: LEFT HEART CATHETERIZATION WITH Isabel Caprice;  Surgeon: Marykay Lex, MD;  Location: [facility] CATH LAB;  Service: Cardiovascular;  Laterality: Left;   LEFT HEART CATHETERIZATION WITH CORONARY/GRAFT ANGIOGRAM Left 02/06/2009   Procedure: LEFT HEART CATHETERIZATION WITH CORONARY/GRAFT ANGIOGRAM; Location: [facility]; Surgeon: Despina Hick, MD   PERCUTANEOUS CORONARY STENT INTERVENTION (PCI-S) N/A 12/06/2013   Procedure: STAGED PERCUTANEOUS CORONARY STENT INTERVENTION (overlapping 4.0 x 38 mm (mid) and 4.0 x 28 mm (ostial) Promus Primier DES to SVG-RPDA graft);  Surgeon: Lesleigh Noe, MD;  Location: [facility] CATH LAB;  Service: Cardiovascular   REMOVAL OF A DIALYSIS CATHETER N/A 11/03/2020   Procedure: REMOVAL OF A DIALYSIS CATHETER;  Surgeon: Annice Needy, MD;  Location: [facility] ORS;  Service: Vascular;  Laterality: N/A;     Social History   Tobacco Use   Smoking status: Former    Types: Pipe, Cigarettes    Quit date: 1992    Years since  quitting: 32.4   Smokeless tobacco: Never  Vaping Use   Vaping Use: Never used  Substance Use Topics   Alcohol use: Not Currently    Alcohol/week: 1.0 standard drink of alcohol    Types: 1 Cans of beer per week    Comment: occasionally drinking only,once a month   Drug use: No       Family History  Problem Relation Age of Onset   Heart attack Father 106       died first MI at age 22   Heart failure Mother      Allergies  Allergen Reactions   Other     Other reaction(s): Myalgias (intolerance), Other (See Comments) Statin Drugs  Statin Drugs     Amoxicillin Diarrhea     REVIEW OF SYSTEMS (Negative unless checked)  Constitutional: [] Weight loss  [] Fever  [] Chills Cardiac: [] Chest pain   [] Chest pressure   [] Palpitations   [] Shortness of breath when laying flat   [] Shortness of breath at rest   [x] Shortness of breath with exertion. Vascular:  [x] Pain in legs with walking   [] Pain in legs at rest   [] Pain in legs when laying flat   [] Claudication   [] Pain in feet when walking  [] Pain in feet at rest  [] Pain in feet when laying flat   [] History of DVT   [] Phlebitis   [] Swelling in legs   [] Varicose veins   [] Non-healing ulcers Pulmonary:   [] Uses home oxygen   [] Productive cough   [] Hemoptysis   [] Wheeze  [] COPD   [] Asthma Neurologic:  [x] Dizziness  [] Blackouts   [] Seizures   [] History of stroke   [] History of TIA  [] Aphasia   [] Temporary blindness   [] Dysphagia   [] Weakness or numbness in arms   [] Weakness or numbness in legs Musculoskeletal:  [x] Arthritis   [] Joint swelling   [x] Joint pain   [] Low back pain Hematologic:  [] Easy bruising  [] Easy bleeding   [] Hypercoagulable state   [x] Anemic   Gastrointestinal:  [] Blood in stool   [] Vomiting blood  [x] Gastroesophageal reflux/heartburn   [] Abdominal pain Genitourinary:  [x] Chronic kidney disease   [] Difficult urination  [] Frequent urination  []   Burning with urination   [] Hematuria Skin:  [] Rashes   [] Ulcers    [] Wounds Psychological:  [] History of anxiety   []  History of major depression.  Physical Examination  BP 119/68 (BP Location: Right Wrist)   Pulse (!) 57   Resp 18   Ht 6' (1.829 m)   Wt 204 lb (92.5 kg)   BMI 27.67 kg/m  Gen:  WD/WN, NAD. Appears younger than stated age. Head: [place]/AT, No temporalis wasting. Ear/Nose/Throat: Hearing grossly intact, nares w/o erythema or drainage Eyes: Conjunctiva clear. Sclera non-icteric Neck: Supple.  Trachea midline Pulmonary:  Good air movement, no use of accessory muscles.  Cardiac: irregular Vascular: excellent thrill in left arm AVG Vessel Right Left  Radial Palpable Palpable               Musculoskeletal: M/S 5/5 throughout.  No deformity or atrophy. Uses a walker.  Mild LE edema. Neurologic: Sensation grossly intact in extremities.  Symmetrical.  Speech is fluent.  Psychiatric: Judgment intact, Mood & affect appropriate for pt's clinical situation. Dermatologic: No rashes or ulcers noted.  No cellulitis or open wounds.      Labs Recent Results (from the past 2160 hour(s))  CBC with Differential/Platelet     Status: Abnormal   Collection Time: 09/16/22 10:03 AM  Result Value Ref Range   WBC 4.2 3.4 - 10.8 x10E3/uL   RBC 3.83 (L) 4.14 - 5.80 x10E6/uL   Hemoglobin 12.1 (L) 13.0 - 17.7 g/dL   Hematocrit 16.1 (L) 09.6 - 51.0 %   MCV 96 79 - 97 fL   MCH 31.6 26.6 - 33.0 pg   MCHC 32.8 31.5 - 35.7 g/dL   RDW 04.5 40.9 - 81.1 %   Platelets 115 (L) 150 - 450 x10E3/uL   Neutrophils 60 Not Estab. %   Lymphs 19 Not Estab. %   Monocytes 13 Not Estab. %   Eos 7 Not Estab. %   Basos 1 Not Estab. %   Neutrophils Absolute 2.5 1.4 - 7.0 x10E3/uL   Lymphocytes Absolute 0.8 0.7 - 3.1 x10E3/uL   Monocytes Absolute 0.5 0.1 - 0.9 x10E3/uL   EOS (ABSOLUTE) 0.3 0.0 - 0.4 x10E3/uL   Basophils Absolute 0.1 0.0 - 0.2 x10E3/uL   Immature Granulocytes 0 Not Estab. %   Immature Grans (Abs) 0.0 0.0 - 0.1 x10E3/uL  Comprehensive metabolic panel      Status: Abnormal   Collection Time: 09/16/22 10:03 AM  Result Value Ref Range   Glucose 99 70 - 99 mg/dL   BUN 50 (H) 8 - 27 mg/dL   Creatinine, Ser 9.14 (H) 0.76 - 1.27 mg/dL   eGFR 18 (L) >78 GN/FAO/1.30   BUN/Creatinine Ratio 16 10 - 24   Sodium 140 134 - 144 mmol/L   Potassium 5.0 3.5 - 5.2 mmol/L   Chloride 96 96 - 106 mmol/L   CO2 28 20 - 29 mmol/L   Calcium 9.4 8.6 - 10.2 mg/dL   Total Protein 6.9 6.0 - 8.5 g/dL   Albumin 4.0 3.7 - 4.7 g/dL   Globulin, Total 2.9 1.5 - 4.5 g/dL   Albumin/Globulin Ratio 1.4 1.2 - 2.2   Bilirubin Total 0.7 0.0 - 1.2 mg/dL   Alkaline Phosphatase 303 (H) 44 - 121 IU/L   AST 345 (H) 0 - 40 IU/L   ALT 326 (H) 0 - 44 IU/L  Lipid Panel w/o Chol/HDL Ratio     Status: None   Collection Time: 09/16/22 10:03 AM  Result Value Ref  Range   Cholesterol, Total 116 100 - 199 mg/dL   Triglycerides 48 0 - 149 mg/dL   HDL 63 >16 mg/dL   VLDL Cholesterol Cal 12 5 - 40 mg/dL   LDL Chol Calc (NIH) 41 0 - 99 mg/dL  Hgb X0R w/o eAG     Status: Abnormal   Collection Time: 09/16/22 10:03 AM  Result Value Ref Range   Hgb A1c MFr Bld 5.9 (H) 4.8 - 5.6 %    Comment:          Prediabetes: 5.7 - 6.4          Diabetes: >6.4          Glycemic control for adults with diabetes: <7.0   TSH     Status: None   Collection Time: 09/16/22 10:03 AM  Result Value Ref Range   TSH 2.690 0.450 - 4.500 uIU/mL  Hepatic function panel     Status: Abnormal   Collection Time: 09/21/22  1:46 PM  Result Value Ref Range   Total Protein 6.8 6.0 - 8.5 g/dL   Albumin 4.2 3.7 - 4.7 g/dL   Bilirubin Total 1.2 0.0 - 1.2 mg/dL   Bilirubin, Direct 6.04 (H) 0.00 - 0.40 mg/dL   Alkaline Phosphatase 340 (H) 44 - 121 IU/L   AST 293 (H) 0 - 40 IU/L   ALT 252 (H) 0 - 44 IU/L  Hepatitis B Surface Antigen     Status: None   Collection Time: 09/21/22  1:46 PM  Result Value Ref Range   Hepatitis B Surface Ag Negative Negative  Hepatitis C genotype     Status: None   Collection Time: 09/21/22   1:46 PM  Result Value Ref Range   Hepatitis C Genotype CANCELED     Comment: Test not performed. Unable to provide an HCV genotype for this sample. The most common reason an HCV genotype cannot be determined is due to a viral load of <1,000 IU/mL, or more rarely, the presence of an untypable HCV genotype.  Result canceled by the ancillary.   POCT Urinalysis Dipstick (54098)     Status: Abnormal   Collection Time: 10/15/22 12:02 PM  Result Value Ref Range   Color, UA     Clarity, UA     Glucose, UA Negative Negative   Bilirubin, UA Negative    Ketones, UA Negative    Spec Grav, UA 1.010 1.010 - 1.025   Blood, UA 3+    pH, UA 6.5 5.0 - 8.0   Protein, UA Positive (A) Negative   Urobilinogen, UA 0.2 0.2 or 1.0 E.U./dL   Nitrite, UA Negative    Leukocytes, UA Large (3+) (A) Negative   Appearance     Odor    Culture, Urine     Status: Abnormal   Collection Time: 10/15/22  2:05 PM   Specimen: Urine   UC  Result Value Ref Range   Urine Culture, Routine Final report (A)    Organism ID, Bacteria Escherichia coli (A)     Comment: Cefazolin <=4 ug/mL Cefazolin with an MIC <=16 predicts susceptibility to the oral agents cefaclor, cefdinir, cefpodoxime, cefprozil, cefuroxime, cephalexin, and loracarbef when used for therapy of uncomplicated urinary tract infections due to E. coli, Klebsiella pneumoniae, and Proteus mirabilis. Greater than 100,000 colony forming units per mL    Antimicrobial Susceptibility Comment     Comment:       ** S = Susceptible; I = Intermediate; R = Resistant **  P = Positive; N = Negative             MICS are expressed in micrograms per mL    Antibiotic                 RSLT#1    RSLT#2    RSLT#3    RSLT#4 Amoxicillin/Clavulanic Acid    S Ampicillin                     S Cefepime                       S Ceftriaxone                    S Cefuroxime                     S Ciprofloxacin                  S Ertapenem                       S Gentamicin                     S Imipenem                       S Levofloxacin                   S Meropenem                      S Nitrofurantoin                 S Piperacillin/Tazobactam        S Tetracycline                   S Tobramycin                     S Trimethoprim/Sulfa             S     Radiology No results found.  Assessment/Plan  ESRD (end stage renal disease) (HCC) His duplex today shows a widely patent left brachial artery to axillary vein AV graft.  Continue to use the graft as current.  Support blood pressure as necessary.  Follow-up in 6 months with duplex.  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) blood glucose control important in reducing the progression of atherosclerotic disease. Also, involved in wound healing. On appropriate medications.   Hypotension Previously had hypertension before starting dialysis.  Now has low blood pressure and is on midodrine    Festus Barren, MD  10/26/2022 2:10 PM    This note was created with Dragon medical transcription system.  Any errors from dictation are purely unintentional

## 2022-10-26 NOTE — Assessment & Plan Note (Signed)
blood glucose control important in reducing the progression of atherosclerotic disease. Also, involved in wound healing. On appropriate medications.  

## 2022-10-27 ENCOUNTER — Other Ambulatory Visit: Payer: Self-pay | Admitting: Cardiovascular Disease

## 2022-10-27 DIAGNOSIS — N186 End stage renal disease: Secondary | ICD-10-CM | POA: Diagnosis not present

## 2022-10-27 DIAGNOSIS — Z992 Dependence on renal dialysis: Secondary | ICD-10-CM | POA: Diagnosis not present

## 2022-10-27 DIAGNOSIS — D631 Anemia in chronic kidney disease: Secondary | ICD-10-CM | POA: Diagnosis not present

## 2022-10-27 DIAGNOSIS — D509 Iron deficiency anemia, unspecified: Secondary | ICD-10-CM | POA: Diagnosis not present

## 2022-10-27 DIAGNOSIS — N250 Renal osteodystrophy: Secondary | ICD-10-CM | POA: Diagnosis not present

## 2022-10-29 DIAGNOSIS — N250 Renal osteodystrophy: Secondary | ICD-10-CM | POA: Diagnosis not present

## 2022-10-29 DIAGNOSIS — D509 Iron deficiency anemia, unspecified: Secondary | ICD-10-CM | POA: Diagnosis not present

## 2022-10-29 DIAGNOSIS — N186 End stage renal disease: Secondary | ICD-10-CM | POA: Diagnosis not present

## 2022-10-29 DIAGNOSIS — D631 Anemia in chronic kidney disease: Secondary | ICD-10-CM | POA: Diagnosis not present

## 2022-10-29 DIAGNOSIS — Z992 Dependence on renal dialysis: Secondary | ICD-10-CM | POA: Diagnosis not present

## 2022-11-02 DIAGNOSIS — Z992 Dependence on renal dialysis: Secondary | ICD-10-CM | POA: Diagnosis not present

## 2022-11-02 DIAGNOSIS — N2581 Secondary hyperparathyroidism of renal origin: Secondary | ICD-10-CM | POA: Diagnosis not present

## 2022-11-02 DIAGNOSIS — N186 End stage renal disease: Secondary | ICD-10-CM | POA: Diagnosis not present

## 2022-11-04 DIAGNOSIS — N186 End stage renal disease: Secondary | ICD-10-CM | POA: Diagnosis not present

## 2022-11-04 DIAGNOSIS — N2581 Secondary hyperparathyroidism of renal origin: Secondary | ICD-10-CM | POA: Diagnosis not present

## 2022-11-04 DIAGNOSIS — Z992 Dependence on renal dialysis: Secondary | ICD-10-CM | POA: Diagnosis not present

## 2022-11-08 ENCOUNTER — Other Ambulatory Visit: Payer: Self-pay

## 2022-11-08 DIAGNOSIS — D509 Iron deficiency anemia, unspecified: Secondary | ICD-10-CM | POA: Diagnosis not present

## 2022-11-08 DIAGNOSIS — N186 End stage renal disease: Secondary | ICD-10-CM | POA: Diagnosis not present

## 2022-11-08 DIAGNOSIS — Z992 Dependence on renal dialysis: Secondary | ICD-10-CM | POA: Diagnosis not present

## 2022-11-08 DIAGNOSIS — D631 Anemia in chronic kidney disease: Secondary | ICD-10-CM | POA: Diagnosis not present

## 2022-11-08 DIAGNOSIS — N250 Renal osteodystrophy: Secondary | ICD-10-CM | POA: Diagnosis not present

## 2022-11-09 ENCOUNTER — Ambulatory Visit: Payer: Medicare Other | Admitting: Podiatry

## 2022-11-10 DIAGNOSIS — Z992 Dependence on renal dialysis: Secondary | ICD-10-CM | POA: Diagnosis not present

## 2022-11-10 DIAGNOSIS — N250 Renal osteodystrophy: Secondary | ICD-10-CM | POA: Diagnosis not present

## 2022-11-10 DIAGNOSIS — D631 Anemia in chronic kidney disease: Secondary | ICD-10-CM | POA: Diagnosis not present

## 2022-11-10 DIAGNOSIS — N186 End stage renal disease: Secondary | ICD-10-CM | POA: Diagnosis not present

## 2022-11-10 DIAGNOSIS — D509 Iron deficiency anemia, unspecified: Secondary | ICD-10-CM | POA: Diagnosis not present

## 2022-11-12 ENCOUNTER — Inpatient Hospital Stay: Payer: Medicare Other | Admitting: Certified Registered"

## 2022-11-12 ENCOUNTER — Other Ambulatory Visit: Payer: Self-pay

## 2022-11-12 ENCOUNTER — Inpatient Hospital Stay: Payer: Medicare Other

## 2022-11-12 ENCOUNTER — Inpatient Hospital Stay
Admission: EM | Admit: 2022-11-12 | Discharge: 2022-11-19 | DRG: 480 | Disposition: A | Discharge status: Skilled Nursing Facility | Payer: Medicare Other | Attending: Internal Medicine | Admitting: Internal Medicine

## 2022-11-12 ENCOUNTER — Emergency Department: Payer: Medicare Other

## 2022-11-12 ENCOUNTER — Encounter
Admission: EM | Disposition: A | Discharge status: Skilled Nursing Facility | Payer: Self-pay | Source: Home / Self Care | Attending: Internal Medicine

## 2022-11-12 DIAGNOSIS — S72009A Fracture of unspecified part of neck of unspecified femur, initial encounter for closed fracture: Secondary | ICD-10-CM | POA: Diagnosis present

## 2022-11-12 DIAGNOSIS — I252 Old myocardial infarction: Secondary | ICD-10-CM

## 2022-11-12 DIAGNOSIS — I480 Paroxysmal atrial fibrillation: Secondary | ICD-10-CM | POA: Diagnosis not present

## 2022-11-12 DIAGNOSIS — Z9049 Acquired absence of other specified parts of digestive tract: Secondary | ICD-10-CM

## 2022-11-12 DIAGNOSIS — N186 End stage renal disease: Secondary | ICD-10-CM

## 2022-11-12 DIAGNOSIS — I482 Chronic atrial fibrillation, unspecified: Secondary | ICD-10-CM | POA: Diagnosis not present

## 2022-11-12 DIAGNOSIS — E559 Vitamin D deficiency, unspecified: Secondary | ICD-10-CM | POA: Diagnosis not present

## 2022-11-12 DIAGNOSIS — W19XXXA Unspecified fall, initial encounter: Secondary | ICD-10-CM

## 2022-11-12 DIAGNOSIS — R710 Precipitous drop in hematocrit: Secondary | ICD-10-CM | POA: Diagnosis not present

## 2022-11-12 DIAGNOSIS — Z79890 Hormone replacement therapy (postmenopausal): Secondary | ICD-10-CM | POA: Diagnosis not present

## 2022-11-12 DIAGNOSIS — I120 Hypertensive chronic kidney disease with stage 5 chronic kidney disease or end stage renal disease: Secondary | ICD-10-CM | POA: Diagnosis not present

## 2022-11-12 DIAGNOSIS — Z6827 Body mass index (BMI) 27.0-27.9, adult: Secondary | ICD-10-CM

## 2022-11-12 DIAGNOSIS — I2510 Atherosclerotic heart disease of native coronary artery without angina pectoris: Secondary | ICD-10-CM | POA: Diagnosis not present

## 2022-11-12 DIAGNOSIS — Z66 Do not resuscitate: Secondary | ICD-10-CM | POA: Diagnosis not present

## 2022-11-12 DIAGNOSIS — Z7984 Long term (current) use of oral hypoglycemic drugs: Secondary | ICD-10-CM | POA: Diagnosis not present

## 2022-11-12 DIAGNOSIS — Z7401 Bed confinement status: Secondary | ICD-10-CM | POA: Diagnosis not present

## 2022-11-12 DIAGNOSIS — Z888 Allergy status to other drugs, medicaments and biological substances status: Secondary | ICD-10-CM

## 2022-11-12 DIAGNOSIS — E860 Dehydration: Secondary | ICD-10-CM | POA: Diagnosis not present

## 2022-11-12 DIAGNOSIS — I7121 Aneurysm of the ascending aorta, without rupture: Secondary | ICD-10-CM | POA: Diagnosis present

## 2022-11-12 DIAGNOSIS — E1122 Type 2 diabetes mellitus with diabetic chronic kidney disease: Secondary | ICD-10-CM

## 2022-11-12 DIAGNOSIS — Z9842 Cataract extraction status, left eye: Secondary | ICD-10-CM

## 2022-11-12 DIAGNOSIS — I4891 Unspecified atrial fibrillation: Secondary | ICD-10-CM | POA: Diagnosis not present

## 2022-11-12 DIAGNOSIS — I130 Hypertensive heart and chronic kidney disease with heart failure and stage 1 through stage 4 chronic kidney disease, or unspecified chronic kidney disease: Secondary | ICD-10-CM | POA: Diagnosis not present

## 2022-11-12 DIAGNOSIS — D631 Anemia in chronic kidney disease: Secondary | ICD-10-CM | POA: Diagnosis present

## 2022-11-12 DIAGNOSIS — R7401 Elevation of levels of liver transaminase levels: Secondary | ICD-10-CM | POA: Diagnosis not present

## 2022-11-12 DIAGNOSIS — E119 Type 2 diabetes mellitus without complications: Secondary | ICD-10-CM

## 2022-11-12 DIAGNOSIS — Z992 Dependence on renal dialysis: Secondary | ICD-10-CM

## 2022-11-12 DIAGNOSIS — I132 Hypertensive heart and chronic kidney disease with heart failure and with stage 5 chronic kidney disease, or end stage renal disease: Secondary | ICD-10-CM | POA: Diagnosis present

## 2022-11-12 DIAGNOSIS — I10 Essential (primary) hypertension: Secondary | ICD-10-CM

## 2022-11-12 DIAGNOSIS — W06XXXA Fall from bed, initial encounter: Secondary | ICD-10-CM | POA: Diagnosis present

## 2022-11-12 DIAGNOSIS — I5022 Chronic systolic (congestive) heart failure: Secondary | ICD-10-CM | POA: Diagnosis not present

## 2022-11-12 DIAGNOSIS — E039 Hypothyroidism, unspecified: Secondary | ICD-10-CM | POA: Diagnosis not present

## 2022-11-12 DIAGNOSIS — Z951 Presence of aortocoronary bypass graft: Secondary | ICD-10-CM

## 2022-11-12 DIAGNOSIS — E669 Obesity, unspecified: Secondary | ICD-10-CM | POA: Diagnosis present

## 2022-11-12 DIAGNOSIS — E785 Hyperlipidemia, unspecified: Secondary | ICD-10-CM | POA: Diagnosis not present

## 2022-11-12 DIAGNOSIS — Z880 Allergy status to penicillin: Secondary | ICD-10-CM

## 2022-11-12 DIAGNOSIS — Z751 Person awaiting admission to adequate facility elsewhere: Secondary | ICD-10-CM

## 2022-11-12 DIAGNOSIS — Z7902 Long term (current) use of antithrombotics/antiplatelets: Secondary | ICD-10-CM

## 2022-11-12 DIAGNOSIS — N2581 Secondary hyperparathyroidism of renal origin: Secondary | ICD-10-CM | POA: Diagnosis present

## 2022-11-12 DIAGNOSIS — U071 COVID-19: Secondary | ICD-10-CM | POA: Diagnosis not present

## 2022-11-12 DIAGNOSIS — K219 Gastro-esophageal reflux disease without esophagitis: Secondary | ICD-10-CM | POA: Diagnosis not present

## 2022-11-12 DIAGNOSIS — I951 Orthostatic hypotension: Secondary | ICD-10-CM | POA: Diagnosis not present

## 2022-11-12 DIAGNOSIS — Z961 Presence of intraocular lens: Secondary | ICD-10-CM | POA: Diagnosis present

## 2022-11-12 DIAGNOSIS — Z7982 Long term (current) use of aspirin: Secondary | ICD-10-CM | POA: Diagnosis not present

## 2022-11-12 DIAGNOSIS — Y92009 Unspecified place in unspecified non-institutional (private) residence as the place of occurrence of the external cause: Secondary | ICD-10-CM

## 2022-11-12 DIAGNOSIS — I428 Other cardiomyopathies: Secondary | ICD-10-CM | POA: Diagnosis present

## 2022-11-12 DIAGNOSIS — I440 Atrioventricular block, first degree: Secondary | ICD-10-CM | POA: Diagnosis not present

## 2022-11-12 DIAGNOSIS — S72141A Displaced intertrochanteric fracture of right femur, initial encounter for closed fracture: Principal | ICD-10-CM

## 2022-11-12 DIAGNOSIS — Z8249 Family history of ischemic heart disease and other diseases of the circulatory system: Secondary | ICD-10-CM

## 2022-11-12 DIAGNOSIS — S72001A Fracture of unspecified part of neck of right femur, initial encounter for closed fracture: Secondary | ICD-10-CM

## 2022-11-12 DIAGNOSIS — I5020 Unspecified systolic (congestive) heart failure: Secondary | ICD-10-CM

## 2022-11-12 DIAGNOSIS — S72001S Fracture of unspecified part of neck of right femur, sequela: Secondary | ICD-10-CM | POA: Diagnosis not present

## 2022-11-12 DIAGNOSIS — M1990 Unspecified osteoarthritis, unspecified site: Secondary | ICD-10-CM | POA: Diagnosis not present

## 2022-11-12 DIAGNOSIS — I9589 Other hypotension: Secondary | ICD-10-CM | POA: Diagnosis not present

## 2022-11-12 DIAGNOSIS — Z85828 Personal history of other malignant neoplasm of skin: Secondary | ICD-10-CM

## 2022-11-12 DIAGNOSIS — W19XXXD Unspecified fall, subsequent encounter: Secondary | ICD-10-CM | POA: Diagnosis not present

## 2022-11-12 DIAGNOSIS — Z9841 Cataract extraction status, right eye: Secondary | ICD-10-CM

## 2022-11-12 DIAGNOSIS — Z0389 Encounter for observation for other suspected diseases and conditions ruled out: Secondary | ICD-10-CM | POA: Diagnosis not present

## 2022-11-12 DIAGNOSIS — F1090 Alcohol use, unspecified, uncomplicated: Secondary | ICD-10-CM | POA: Diagnosis not present

## 2022-11-12 DIAGNOSIS — I959 Hypotension, unspecified: Secondary | ICD-10-CM | POA: Diagnosis not present

## 2022-11-12 DIAGNOSIS — Z87891 Personal history of nicotine dependence: Secondary | ICD-10-CM | POA: Diagnosis not present

## 2022-11-12 DIAGNOSIS — D638 Anemia in other chronic diseases classified elsewhere: Secondary | ICD-10-CM | POA: Diagnosis not present

## 2022-11-12 DIAGNOSIS — E7800 Pure hypercholesterolemia, unspecified: Secondary | ICD-10-CM | POA: Diagnosis not present

## 2022-11-12 DIAGNOSIS — Z01810 Encounter for preprocedural cardiovascular examination: Secondary | ICD-10-CM | POA: Diagnosis not present

## 2022-11-12 DIAGNOSIS — B029 Zoster without complications: Secondary | ICD-10-CM | POA: Diagnosis not present

## 2022-11-12 DIAGNOSIS — D509 Iron deficiency anemia, unspecified: Secondary | ICD-10-CM | POA: Diagnosis present

## 2022-11-12 DIAGNOSIS — S72141D Displaced intertrochanteric fracture of right femur, subsequent encounter for closed fracture with routine healing: Secondary | ICD-10-CM | POA: Diagnosis not present

## 2022-11-12 DIAGNOSIS — K5900 Constipation, unspecified: Secondary | ICD-10-CM | POA: Diagnosis not present

## 2022-11-12 DIAGNOSIS — Z79899 Other long term (current) drug therapy: Secondary | ICD-10-CM

## 2022-11-12 DIAGNOSIS — E875 Hyperkalemia: Secondary | ICD-10-CM | POA: Diagnosis not present

## 2022-11-12 DIAGNOSIS — R0989 Other specified symptoms and signs involving the circulatory and respiratory systems: Secondary | ICD-10-CM | POA: Diagnosis not present

## 2022-11-12 DIAGNOSIS — M1711 Unilateral primary osteoarthritis, right knee: Secondary | ICD-10-CM | POA: Diagnosis not present

## 2022-11-12 DIAGNOSIS — M85851 Other specified disorders of bone density and structure, right thigh: Secondary | ICD-10-CM | POA: Diagnosis not present

## 2022-11-12 DIAGNOSIS — M8580 Other specified disorders of bone density and structure, unspecified site: Secondary | ICD-10-CM | POA: Diagnosis not present

## 2022-11-12 DIAGNOSIS — J309 Allergic rhinitis, unspecified: Secondary | ICD-10-CM | POA: Diagnosis not present

## 2022-11-12 HISTORY — PX: INTRAMEDULLARY (IM) NAIL INTERTROCHANTERIC: SHX5875

## 2022-11-12 LAB — HEPATITIS B SURFACE ANTIGEN: Hepatitis B Surface Ag: NONREACTIVE

## 2022-11-12 LAB — CBC WITH DIFFERENTIAL/PLATELET
Abs Immature Granulocytes: 0.03 10*3/uL (ref 0.00–0.07)
Basophils Absolute: 0 10*3/uL (ref 0.0–0.1)
Basophils Relative: 1 %
Eosinophils Absolute: 0.5 10*3/uL (ref 0.0–0.5)
Eosinophils Relative: 11 %
HCT: 37.1 % — ABNORMAL LOW (ref 39.0–52.0)
Hemoglobin: 12.3 g/dL — ABNORMAL LOW (ref 13.0–17.0)
Immature Granulocytes: 1 %
Lymphocytes Relative: 15 %
Lymphs Abs: 0.7 10*3/uL (ref 0.7–4.0)
MCH: 32.5 pg (ref 26.0–34.0)
MCHC: 33.2 g/dL (ref 30.0–36.0)
MCV: 98.1 fL (ref 80.0–100.0)
Monocytes Absolute: 0.8 10*3/uL (ref 0.1–1.0)
Monocytes Relative: 17 %
Neutro Abs: 2.6 10*3/uL (ref 1.7–7.7)
Neutrophils Relative %: 55 %
Platelets: 110 10*3/uL — ABNORMAL LOW (ref 150–400)
RBC: 3.78 MIL/uL — ABNORMAL LOW (ref 4.22–5.81)
RDW: 14.5 % (ref 11.5–15.5)
WBC: 4.6 10*3/uL (ref 4.0–10.5)
nRBC: 0 % (ref 0.0–0.2)

## 2022-11-12 LAB — BASIC METABOLIC PANEL
Anion gap: 9 (ref 5–15)
BUN: 87 mg/dL — ABNORMAL HIGH (ref 8–23)
CO2: 27 mmol/L (ref 22–32)
Calcium: 8.6 mg/dL — ABNORMAL LOW (ref 8.9–10.3)
Chloride: 98 mmol/L (ref 98–111)
Creatinine, Ser: 3.93 mg/dL — ABNORMAL HIGH (ref 0.61–1.24)
GFR, Estimated: 14 mL/min — ABNORMAL LOW (ref >60)
Glucose, Bld: 131 mg/dL — ABNORMAL HIGH (ref 70–99)
Potassium: 4.7 mmol/L (ref 3.5–5.1)
Sodium: 134 mmol/L — ABNORMAL LOW (ref 135–145)

## 2022-11-12 LAB — GLUCOSE, CAPILLARY: Glucose-Capillary: 169 mg/dL — ABNORMAL HIGH (ref 70–99)

## 2022-11-12 LAB — APTT: aPTT: 26 seconds (ref 24–36)

## 2022-11-12 LAB — PROTIME-INR
INR: 1.1 (ref 0.8–1.2)
Prothrombin Time: 14.2 seconds (ref 11.4–15.2)

## 2022-11-12 LAB — CBG MONITORING, ED
Glucose-Capillary: 105 mg/dL — ABNORMAL HIGH (ref 70–99)
Glucose-Capillary: 177 mg/dL — ABNORMAL HIGH (ref 70–99)

## 2022-11-12 LAB — ACETAMINOPHEN LEVEL: Acetaminophen (Tylenol), Serum: <10 ug/mL — ABNORMAL LOW (ref 10–30)

## 2022-11-12 SURGERY — FIXATION, FRACTURE, INTERTROCHANTERIC, WITH INTRAMEDULLARY ROD
Anesthesia: General | Site: Hip | Laterality: Right

## 2022-11-12 MED ORDER — ACETAMINOPHEN 325 MG PO TABS: 325.0000 mg | ORAL_TABLET | Freq: Four times a day (QID) | ORAL | Status: DC | PRN

## 2022-11-12 MED ORDER — POLYETHYLENE GLYCOL 3350 17 G PO PACK: 17.0000 g | PACK | Freq: Every day | ORAL | Status: DC | PRN

## 2022-11-12 MED ORDER — PHENYLEPHRINE HCL-NACL 20-0.9 MG/250ML-% IV SOLN
INTRAVENOUS | Status: DC | PRN
  Administered 2022-11-12: 25 ug/min via INTRAVENOUS

## 2022-11-12 MED ORDER — ALTEPLASE 2 MG IJ SOLR: 2.0000 mg | Freq: Once | INTRAMUSCULAR | Status: DC | PRN

## 2022-11-12 MED ORDER — LIDOCAINE-PRILOCAINE 2.5-2.5 % EX CREA: 1.0000 | TOPICAL_CREAM | CUTANEOUS | Status: DC | PRN

## 2022-11-12 MED ORDER — CEFAZOLIN SODIUM-DEXTROSE 1-4 GM/50ML-% IV SOLN
INTRAVENOUS | Status: DC | PRN
  Administered 2022-11-12: 1 g via INTRAVENOUS

## 2022-11-12 MED ORDER — DOCUSATE SODIUM 100 MG PO CAPS
100.0000 mg | ORAL_CAPSULE | Freq: Two times a day (BID) | ORAL | Status: DC
  Administered 2022-11-12 – 2022-11-18 (×11): 100 mg via ORAL
  Filled 2022-11-12 (×12): qty 1

## 2022-11-12 MED ORDER — TRAMADOL HCL 50 MG PO TABS
50.0000 mg | ORAL_TABLET | Freq: Four times a day (QID) | ORAL | Status: DC
  Administered 2022-11-12 – 2022-11-19 (×27): 50 mg via ORAL
  Filled 2022-11-12 (×28): qty 1

## 2022-11-12 MED ORDER — 0.9 % SODIUM CHLORIDE (POUR BTL) OPTIME
TOPICAL | Status: DC | PRN
  Administered 2022-11-12: 1000 mL

## 2022-11-12 MED ORDER — POTASSIUM CHLORIDE CRYS ER 20 MEQ PO TBCR
40.0000 meq | EXTENDED_RELEASE_TABLET | Freq: Every day | ORAL | Status: DC
  Administered 2022-11-12 – 2022-11-15 (×4): 40 meq via ORAL
  Filled 2022-11-12 (×4): qty 2

## 2022-11-12 MED ORDER — SODIUM CHLORIDE 0.9 % IV BOLUS
250.0000 mL | Freq: Once | INTRAVENOUS | Status: AC
  Administered 2022-11-12: 250 mL via INTRAVENOUS

## 2022-11-12 MED ORDER — CEFAZOLIN SODIUM-DEXTROSE 2-4 GM/100ML-% IV SOLN
INTRAVENOUS | Status: AC
  Filled 2022-11-12: qty 100

## 2022-11-12 MED ORDER — ACETAMINOPHEN 500 MG PO TABS
500.0000 mg | ORAL_TABLET | Freq: Four times a day (QID) | ORAL | Status: AC
  Administered 2022-11-12 – 2022-11-13 (×3): 500 mg via ORAL
  Filled 2022-11-12 (×3): qty 1

## 2022-11-12 MED ORDER — PROPOFOL 10 MG/ML IV BOLUS
INTRAVENOUS | Status: AC
  Filled 2022-11-12: qty 20

## 2022-11-12 MED ORDER — GLYCOPYRROLATE 0.2 MG/ML IJ SOLN
INTRAMUSCULAR | Status: DC | PRN
  Administered 2022-11-12 (×2): .1 mg via INTRAVENOUS

## 2022-11-12 MED ORDER — ACETAMINOPHEN 10 MG/ML IV SOLN
INTRAVENOUS | Status: AC
  Filled 2022-11-12: qty 100

## 2022-11-12 MED ORDER — EPHEDRINE 5 MG/ML INJ
INTRAVENOUS | Status: AC
  Filled 2022-11-12: qty 5

## 2022-11-12 MED ORDER — LIDOCAINE HCL (PF) 2 % IJ SOLN
INTRAMUSCULAR | Status: AC
  Filled 2022-11-12: qty 5

## 2022-11-12 MED ORDER — ETOMIDATE 2 MG/ML IV SOLN
INTRAVENOUS | Status: DC | PRN
  Administered 2022-11-12: 14 mg via INTRAVENOUS

## 2022-11-12 MED ORDER — CEFAZOLIN SODIUM-DEXTROSE 2-4 GM/100ML-% IV SOLN
2.0000 g | Freq: Four times a day (QID) | INTRAVENOUS | Status: AC
  Administered 2022-11-12 – 2022-11-13 (×2): 2 g via INTRAVENOUS
  Filled 2022-11-12 (×2): qty 100

## 2022-11-12 MED ORDER — PENTAFLUOROPROP-TETRAFLUOROETH EX AERO: 1.0000 | INHALATION_SPRAY | CUTANEOUS | Status: DC | PRN

## 2022-11-12 MED ORDER — GLIPIZIDE 5 MG PO TABS
2.5000 mg | ORAL_TABLET | Freq: Every day | ORAL | Status: DC
  Administered 2022-11-13 – 2022-11-18 (×6): 2.5 mg via ORAL
  Filled 2022-11-12 (×7): qty 0.5

## 2022-11-12 MED ORDER — INSULIN ASPART 100 UNIT/ML IJ SOLN
0.0000 [IU] | Freq: Three times a day (TID) | INTRAMUSCULAR | Status: DC
  Administered 2022-11-12 – 2022-11-13 (×3): 1 [IU] via SUBCUTANEOUS
  Filled 2022-11-12 (×2): qty 1

## 2022-11-12 MED ORDER — ONDANSETRON HCL 4 MG PO TABS: 4.0000 mg | ORAL_TABLET | Freq: Four times a day (QID) | ORAL | Status: DC | PRN

## 2022-11-12 MED ORDER — ONDANSETRON HCL 4 MG/2ML IJ SOLN: 4.0000 mg | Freq: Four times a day (QID) | INTRAMUSCULAR | Status: DC | PRN

## 2022-11-12 MED ORDER — DEXAMETHASONE SODIUM PHOSPHATE 10 MG/ML IJ SOLN
INTRAMUSCULAR | Status: AC
  Filled 2022-11-12: qty 1

## 2022-11-12 MED ORDER — FLUTICASONE PROPIONATE 50 MCG/ACT NA SUSP: 2.0000 | Freq: Every day | NASAL | Status: DC | PRN

## 2022-11-12 MED ORDER — EPINEPHRINE PF 1 MG/ML IJ SOLN
INTRAMUSCULAR | Status: AC
  Filled 2022-11-12: qty 1

## 2022-11-12 MED ORDER — METHOCARBAMOL 500 MG PO TABS
500.0000 mg | ORAL_TABLET | Freq: Four times a day (QID) | ORAL | Status: DC | PRN
  Administered 2022-11-16: 500 mg via ORAL
  Filled 2022-11-12: qty 1

## 2022-11-12 MED ORDER — FENTANYL CITRATE (PF) 100 MCG/2ML IJ SOLN
INTRAMUSCULAR | Status: AC
  Filled 2022-11-12: qty 2

## 2022-11-12 MED ORDER — DEXAMETHASONE SODIUM PHOSPHATE 10 MG/ML IJ SOLN
INTRAMUSCULAR | Status: DC | PRN
  Administered 2022-11-12: 4 mg via INTRAVENOUS

## 2022-11-12 MED ORDER — ROCURONIUM BROMIDE 10 MG/ML (PF) SYRINGE
PREFILLED_SYRINGE | INTRAVENOUS | Status: AC
  Filled 2022-11-12: qty 10

## 2022-11-12 MED ORDER — LEVOTHYROXINE SODIUM 50 MCG PO TABS
100.0000 ug | ORAL_TABLET | Freq: Every morning | ORAL | Status: DC
  Administered 2022-11-13 – 2022-11-19 (×7): 100 ug via ORAL
  Filled 2022-11-12 (×7): qty 2

## 2022-11-12 MED ORDER — BUPIVACAINE HCL (PF) 0.25 % IJ SOLN
INTRAMUSCULAR | Status: AC
  Filled 2022-11-12: qty 30

## 2022-11-12 MED ORDER — BUPIVACAINE-EPINEPHRINE 0.25% -1:200000 IJ SOLN
INTRAMUSCULAR | Status: DC | PRN
  Administered 2022-11-12: 30 mL

## 2022-11-12 MED ORDER — SODIUM CHLORIDE 0.9% FLUSH: 3.0000 mL | INTRAVENOUS | Status: DC | PRN

## 2022-11-12 MED ORDER — PHENYLEPHRINE HCL-NACL 20-0.9 MG/250ML-% IV SOLN
INTRAVENOUS | Status: AC
  Filled 2022-11-12: qty 250

## 2022-11-12 MED ORDER — MENTHOL 3 MG MT LOZG: 1.0000 | LOZENGE | OROMUCOSAL | Status: DC | PRN

## 2022-11-12 MED ORDER — SOTALOL HCL 80 MG PO TABS
80.0000 mg | ORAL_TABLET | Freq: Two times a day (BID) | ORAL | Status: DC
  Administered 2022-11-13 – 2022-11-18 (×11): 80 mg via ORAL
  Filled 2022-11-12 (×16): qty 1

## 2022-11-12 MED ORDER — HYDROMORPHONE HCL 1 MG/ML IJ SOLN
1.0000 mg | INTRAMUSCULAR | Status: DC | PRN
  Administered 2022-11-12: 1 mg via INTRAVENOUS
  Filled 2022-11-12: qty 1

## 2022-11-12 MED ORDER — ONDANSETRON HCL 4 MG/2ML IJ SOLN
INTRAMUSCULAR | Status: AC
  Filled 2022-11-12: qty 2

## 2022-11-12 MED ORDER — ACETAMINOPHEN 10 MG/ML IV SOLN
INTRAVENOUS | Status: DC | PRN
  Administered 2022-11-12: 1000 mg via INTRAVENOUS

## 2022-11-12 MED ORDER — CEFAZOLIN SODIUM-DEXTROSE 2-4 GM/100ML-% IV SOLN: 2.0000 g | INTRAVENOUS | Status: DC

## 2022-11-12 MED ORDER — SENNA 8.6 MG PO TABS
1.0000 | ORAL_TABLET | Freq: Two times a day (BID) | ORAL | Status: DC
  Administered 2022-11-12 – 2022-11-18 (×9): 8.6 mg via ORAL
  Filled 2022-11-12 (×10): qty 1

## 2022-11-12 MED ORDER — BISACODYL 10 MG RE SUPP: 10.0000 mg | Freq: Every day | RECTAL | Status: DC | PRN

## 2022-11-12 MED ORDER — ETOMIDATE 2 MG/ML IV SOLN
INTRAVENOUS | Status: AC
  Filled 2022-11-12: qty 10

## 2022-11-12 MED ORDER — SODIUM CHLORIDE 0.9 % IV SOLN: INTRAVENOUS | Status: DC

## 2022-11-12 MED ORDER — HYDROMORPHONE HCL 1 MG/ML IJ SOLN
0.5000 mg | Freq: Once | INTRAMUSCULAR | Status: AC
  Administered 2022-11-12: 0.5 mg via INTRAVENOUS
  Filled 2022-11-12: qty 0.5

## 2022-11-12 MED ORDER — SODIUM CHLORIDE 0.9% FLUSH
3.0000 mL | Freq: Two times a day (BID) | INTRAVENOUS | Status: DC
  Administered 2022-11-12 – 2022-11-14 (×5): 3 mL via INTRAVENOUS

## 2022-11-12 MED ORDER — HYDROMORPHONE HCL 1 MG/ML IJ SOLN: 0.5000 mg | INTRAMUSCULAR | Status: DC | PRN

## 2022-11-12 MED ORDER — CHLORHEXIDINE GLUCONATE CLOTH 2 % EX PADS
6.0000 | MEDICATED_PAD | Freq: Every day | CUTANEOUS | Status: DC
  Administered 2022-11-13 – 2022-11-19 (×6): 6 via TOPICAL
  Filled 2022-11-12: qty 6

## 2022-11-12 MED ORDER — ALUM & MAG HYDROXIDE-SIMETH 200-200-20 MG/5ML PO SUSP: 30.0000 mL | ORAL | Status: DC | PRN

## 2022-11-12 MED ORDER — METHOCARBAMOL 1000 MG/10ML IJ SOLN: 500.0000 mg | Freq: Four times a day (QID) | INTRAVENOUS | Status: DC | PRN

## 2022-11-12 MED ORDER — ASPIRIN 325 MG PO TBEC
325.0000 mg | DELAYED_RELEASE_TABLET | Freq: Every day | ORAL | Status: DC
  Administered 2022-11-13 – 2022-11-18 (×6): 325 mg via ORAL
  Filled 2022-11-12 (×6): qty 1

## 2022-11-12 MED ORDER — MIDODRINE HCL 5 MG PO TABS
5.0000 mg | ORAL_TABLET | Freq: Three times a day (TID) | ORAL | Status: DC
  Administered 2022-11-13 – 2022-11-19 (×21): 5 mg via ORAL
  Filled 2022-11-12 (×19): qty 1

## 2022-11-12 MED ORDER — LIDOCAINE HCL (PF) 1 % IJ SOLN: 5.0000 mL | INTRAMUSCULAR | Status: DC | PRN

## 2022-11-12 MED ORDER — CLOPIDOGREL BISULFATE 75 MG PO TABS
75.0000 mg | ORAL_TABLET | Freq: Every day | ORAL | Status: DC
  Administered 2022-11-13 – 2022-11-19 (×7): 75 mg via ORAL
  Filled 2022-11-12 (×7): qty 1

## 2022-11-12 MED ORDER — EPHEDRINE SULFATE (PRESSORS) 50 MG/ML IJ SOLN
INTRAMUSCULAR | Status: DC | PRN
  Administered 2022-11-12 (×2): 5 mg via INTRAVENOUS
  Administered 2022-11-12: 2.5 mg via INTRAVENOUS
  Administered 2022-11-12: 5 mg via INTRAVENOUS
  Administered 2022-11-12: 7.5 mg via INTRAVENOUS
  Administered 2022-11-12: 5 mg via INTRAVENOUS

## 2022-11-12 MED ORDER — HEPARIN SODIUM (PORCINE) 1000 UNIT/ML DIALYSIS: 1000.0000 [IU] | INTRAMUSCULAR | Status: DC | PRN

## 2022-11-12 MED ORDER — FUROSEMIDE 40 MG PO TABS
40.0000 mg | ORAL_TABLET | Freq: Every day | ORAL | Status: DC
  Administered 2022-11-15 – 2022-11-19 (×5): 40 mg via ORAL
  Filled 2022-11-12 (×7): qty 1

## 2022-11-12 MED ORDER — ONDANSETRON HCL 4 MG/2ML IJ SOLN
INTRAMUSCULAR | Status: DC | PRN
  Administered 2022-11-12: 4 mg via INTRAVENOUS

## 2022-11-12 MED ORDER — FENTANYL CITRATE (PF) 100 MCG/2ML IJ SOLN
INTRAMUSCULAR | Status: DC | PRN
  Administered 2022-11-12: 25 ug via INTRAVENOUS

## 2022-11-12 MED ORDER — MORPHINE SULFATE (PF) 2 MG/ML IV SOLN
0.5000 mg | INTRAVENOUS | Status: DC | PRN
  Administered 2022-11-15: 1 mg via INTRAVENOUS
  Filled 2022-11-12: qty 1

## 2022-11-12 MED ORDER — VASOPRESSIN 20 UNIT/ML IV SOLN
INTRAVENOUS | Status: DC | PRN
  Administered 2022-11-12 (×11): 1 [IU] via INTRAVENOUS

## 2022-11-12 MED ORDER — ANTICOAGULANT SODIUM CITRATE 4% (200MG/5ML) IV SOLN: 5.0000 mL | Status: DC | PRN

## 2022-11-12 MED ORDER — SODIUM CHLORIDE 0.9 % IV SOLN: 250.0000 mL | INTRAVENOUS | Status: DC | PRN

## 2022-11-12 MED ORDER — MIDODRINE HCL 5 MG PO TABS
5.0000 mg | ORAL_TABLET | Freq: Once | ORAL | Status: AC
  Administered 2022-11-12: 5 mg via ORAL
  Filled 2022-11-12: qty 1

## 2022-11-12 MED ORDER — PHENOL 1.4 % MT LIQD: 1.0000 | OROMUCOSAL | Status: DC | PRN

## 2022-11-12 MED ORDER — HYDROCODONE-ACETAMINOPHEN 5-325 MG PO TABS: 1.0000 | ORAL_TABLET | ORAL | Status: DC | PRN

## 2022-11-12 SURGICAL SUPPLY — 44 items
BIT DRILL 4.3MMS DISTAL GRDTED (BIT) IMPLANT
BNDG CMPR 5X6 CHSV STRCH STRL (GAUZE/BANDAGES/DRESSINGS) ×2
BNDG COHESIVE 6X5 TAN ST LF (GAUZE/BANDAGES/DRESSINGS) ×2 IMPLANT
CORTICAL BONE SCR 5.0MM X 46MM (Screw) ×1 IMPLANT
DRAPE 3/4 80X56 (DRAPES) ×2 IMPLANT
DRAPE SURG 17X11 SM STRL (DRAPES) ×2 IMPLANT
DRAPE U-SHAPE 47X51 STRL (DRAPES) ×2 IMPLANT
DRILL 4.3MMS DISTAL GRADUATED (BIT) ×1
DRSG OPSITE POSTOP 3X4 (GAUZE/BANDAGES/DRESSINGS) ×2 IMPLANT
DRSG OPSITE POSTOP 4X14 (GAUZE/BANDAGES/DRESSINGS) IMPLANT
DRSG OPSITE POSTOP 4X6 (GAUZE/BANDAGES/DRESSINGS) ×1 IMPLANT
DURAPREP 26ML APPLICATOR (WOUND CARE) ×2 IMPLANT
ELECT REM PT RETURN 9FT ADLT (ELECTROSURGICAL) ×1
ELECTRODE REM PT RTRN 9FT ADLT (ELECTROSURGICAL) ×1 IMPLANT
GLOVE BIOGEL PI IND STRL 9 (GLOVE) ×1 IMPLANT
GLOVE BIOGEL PI ORTHO SZ9 (GLOVE) ×4 IMPLANT
GOWN STRL REUS TWL 2XL XL LVL4 (GOWN DISPOSABLE) ×1 IMPLANT
GOWN STRL REUS W/ TWL LRG LVL3 (GOWN DISPOSABLE) ×1 IMPLANT
GOWN STRL REUS W/TWL LRG LVL3 (GOWN DISPOSABLE) ×1
GUIDEPIN VERSANAIL DSP 3.2X444 (ORTHOPEDIC DISPOSABLE SUPPLIES) IMPLANT
GUIDEWIRE BALL NOSE 100CM (WIRE) IMPLANT
HEMOVAC 400CC 10FR (MISCELLANEOUS) IMPLANT
HFN LAG SCREW 10.5MM X 115MM (Orthopedic Implant) IMPLANT
HFN RH 130 DEG 11MM X 420MM (Nail) IMPLANT
KIT TURNOVER CYSTO (KITS) ×1 IMPLANT
MANIFOLD NEPTUNE II (INSTRUMENTS) ×1 IMPLANT
MAT ABSORB FLUID 56X50 GRAY (MISCELLANEOUS) ×1 IMPLANT
NDL HYPO 22X1.5 SAFETY MO (MISCELLANEOUS) IMPLANT
NEEDLE HYPO 22X1.5 SAFETY MO (MISCELLANEOUS) ×1 IMPLANT
NS IRRIG 500ML POUR BTL (IV SOLUTION) ×1 IMPLANT
PACK HIP COMPR (MISCELLANEOUS) ×1 IMPLANT
PAD ARMBOARD 7.5X6 YLW CONV (MISCELLANEOUS) ×1 IMPLANT
SCREW BONE CORTICAL 5.0X58 (Screw) IMPLANT
SCREW CORTICL BON 5.0MM X 46MM (Screw) IMPLANT
SCREWDRIVER HEX TIP 3.5MM (MISCELLANEOUS) IMPLANT
STAPLER SKIN PROX 35W (STAPLE) ×1 IMPLANT
SUCTION TUBE FRAZIER 10FR DISP (SUCTIONS) ×1 IMPLANT
SUT VIC AB 0 CT1 36 (SUTURE) ×2 IMPLANT
SUT VIC AB 2-0 CT1 27 (SUTURE) ×2
SUT VIC AB 2-0 CT1 TAPERPNT 27 (SUTURE) ×1 IMPLANT
SUT VICRYL 0 UR6 27IN ABS (SUTURE) ×1 IMPLANT
SYR 30ML LL (SYRINGE) ×1 IMPLANT
TRAP FLUID SMOKE EVACUATOR (MISCELLANEOUS) ×1 IMPLANT
WATER STERILE IRR 500ML POUR (IV SOLUTION) ×1 IMPLANT

## 2022-11-12 NOTE — Progress Notes (Signed)
[facility]  ROUNDING NOTE   Subjective:   Matthew [name] is an 89 year old male with medical concerns including CAD status post CABG, hypothyroidism, hypertension, CHF, type 2 diabetes, and end-stage renal disease on hemodialysis. He presents to the ED after suffering a fall and rotation and shortened right leg. He has been admitted for Hip fracture [facility]) [S72.009A] End-stage renal disease on hemodialysis (HCC) [N18.6, Z99.2] Fall, initial encounter [W19.XXXA] Intertrochanteric fracture of right femur, closed, initial encounter [facility]) [S72.141A]   Patient is known to our practice and receives outpatient treatments at [facility], supervised by Dr. Cherylann Ratel. His last treatment was on Wednesday. He states she was sitting at side of bed and slipped onto floor. Patient had right hip fracture in sept of 2022 as well.   Labs on ED arrival significant for BUN 87, creatinine 3.93 with GFR 14, sodium 134, and hemoglobin 12.3.Chest x-ray shows mild vascular congestion.  Right femur x-ray shows fracture of the right femoral neck.  Orthopedic surgery has been consulted.  We have been consulted to manage dialysis needs.  Objective:  Vital signs in last 24 hours:  Temp:  [97.5 F (36.4 C)-98.4 F (36.9 C)] 97.5 F (36.4 C) (06/21 1452) Pulse Rate:  [57-75] 59 (06/21 1456) Resp:  [16-19] 18 (06/21 1452) BP: (101-156)/(48-75) 156/48 (06/21 1452) SpO2:  [95 %-100 %] 100 % (06/21 1452) Weight:  [92.5 kg] 92.5 kg (06/21 1204)  Weight change:  Filed Weights   11/12/22 0338 11/12/22 1204  Weight: 92.5 kg 92.5 kg    Intake/Output: No intake/output data recorded.   Intake/Output this shift:  Total I/O In: 200 [I.V.:200] Out: 50 [Blood:50]  Physical Exam: General: NAD  Head: Normocephalic, atraumatic. Moist oral mucosal membranes  Eyes: Anicteric  Lungs:  Clear to auscultation  Heart: Regular rate and rhythm  Abdomen:  Soft, nontender  Extremities:  No peripheral  edema.RLE-shortened, lateral rotation   Neurologic: Nonfocal, moving all four extremities  Skin: No lesions  Access: Rt Permcath    Basic Metabolic Panel: Recent Labs  Lab 11/12/22 0404  NA 134*  K 4.7  CL 98  CO2 27  GLUCOSE 131*  BUN 87*  CREATININE 3.93*  CALCIUM 8.6*    Liver Function Tests: No results for input(s): "AST", "ALT", "ALKPHOS", "BILITOT", "PROT", "ALBUMIN" in the last 168 hours. No results for input(s): "LIPASE", "AMYLASE" in the last 168 hours. No results for input(s): "AMMONIA" in the last 168 hours.  CBC: Recent Labs  Lab 11/12/22 0404  WBC 4.6  NEUTROABS 2.6  HGB 12.3*  HCT 37.1*  MCV 98.1  PLT 110*    Cardiac Enzymes: No results for input(s): "CKTOTAL", "CKMB", "CKMBINDEX", "TROPONINI" in the last 168 hours.  BNP: Invalid input(s): "POCBNP"  CBG: Recent Labs  Lab 11/12/22 1148 11/12/22 1456  GLUCAP 105* 177*    Microbiology: Results for orders placed or performed in visit on 10/15/22  Culture, Urine     Status: Abnormal   Collection Time: 10/15/22  2:05 PM   Specimen: Urine   UC  Result Value Ref Range Status   Urine Culture, Routine Final report (A)  Final   Organism ID, Bacteria Escherichia coli (A)  Final    Comment: Cefazolin <=4 ug/mL Cefazolin with an MIC <=16 predicts susceptibility to the oral agents cefaclor, cefdinir, cefpodoxime, cefprozil, cefuroxime, cephalexin, and loracarbef when used for therapy of uncomplicated urinary tract infections due to E. coli, Klebsiella pneumoniae, and Proteus mirabilis. Greater than 100,000 colony forming units per mL  Antimicrobial Susceptibility Comment  Final    Comment:       ** S = Susceptible; I = Intermediate; R = Resistant **                    P = Positive; N = Negative             MICS are expressed in micrograms per mL    Antibiotic                 RSLT#1    RSLT#2    RSLT#3    RSLT#4 Amoxicillin/Clavulanic Acid    S Ampicillin                     S Cefepime                        S Ceftriaxone                    S Cefuroxime                     S Ciprofloxacin                  S Ertapenem                      S Gentamicin                     S Imipenem                       S Levofloxacin                   S Meropenem                      S Nitrofurantoin                 S Piperacillin/Tazobactam        S Tetracycline                   S Tobramycin                     S Trimethoprim/Sulfa             S     Coagulation Studies: Recent Labs    11/12/22 0618  LABPROT 14.2  INR 1.1    Urinalysis: No results for input(s): "COLORURINE", "LABSPEC", "PHURINE", "GLUCOSEU", "HGBUR", "BILIRUBINUR", "KETONESUR", "PROTEINUR", "UROBILINOGEN", "NITRITE", "LEUKOCYTESUR" in the last 72 hours.  Invalid input(s): "APPERANCEUR"    Imaging: DG HIP UNILAT WITH PELVIS 2-3 VIEWS RIGHT  Result Date: 11/12/2022 CLINICAL DATA:  Surgery, elective EXAM: DG HIP (WITH OR WITHOUT PELVIS) 2-3V RIGHT COMPARISON:  None Available. FINDINGS: Fluoro time: 1 minute and 30 seconds. Four C-arm fluoroscopic images were obtained intraoperatively and submitted for post operative interpretation. These images demonstrate surgical changes associated with intramedullary nail and screw fixation of the femur. Please see the performing provider's procedural report for further detail. IMPRESSION: Intraoperative fluoroscopic imaging, as detailed above. Electronically Signed   By: Feliberto Harts M.D.   On: 11/12/2022 14:35   DG C-Arm 1-60 Min-No Report  Result Date: 11/12/2022 Fluoroscopy was utilized by the requesting physician.  No radiographic interpretation.   DG Chest Port 1 View  Result Date: 11/12/2022 CLINICAL DATA:  Right femoral neck  fracture. EXAM: PORTABLE CHEST 1 VIEW COMPARISON:  02/11/2021 FINDINGS: The cardio pericardial silhouette is enlarged. Status post CABG. Mild vascular congestion without edema. No focal consolidation or pleural effusion. Vascular stent device  noted medial left arm. Bones are diffusely demineralized. IMPRESSION: Enlargement of the cardiopericardial silhouette with mild vascular congestion. Electronically Signed   By: Kennith Center M.D.   On: 11/12/2022 04:59   DG Hip Unilat W or Wo Pelvis 2-3 Views Right  Result Date: 11/12/2022 CLINICAL DATA:  Fall.  Right hip pain. EXAM: DG HIP (WITH OR WITHOUT PELVIS) 2-3V RIGHT COMPARISON:  None Available. FINDINGS: Diffuse osteopenia. SI joints and symphysis pubis unremarkable. Deformity of the right superior inferior pubic rami compatible with healed fractures. Acute comminuted intertrochanteric fracture of the right femoral neck evident with varus angulation. IMPRESSION: Acute comminuted intertrochanteric fracture of the right femoral neck with varus angulation. Electronically Signed   By: Kennith Center M.D.   On: 11/12/2022 04:58   DG Femur Min 2 Views Right  Result Date: 11/12/2022 CLINICAL DATA:  Fall. EXAM: RIGHT FEMUR 2 VIEWS COMPARISON:  None Available. FINDINGS: Two view show study shows diffuse osteopenia. Comminuted intertrochanteric fracture of the femoral neck evident with varus angulation. Deformity in the right superior inferior pubic rami suggest remote trauma. Degenerative changes are evident at the knee. IMPRESSION: Comminuted intertrochanteric fracture of the right femoral neck with varus angulation. Electronically Signed   By: Kennith Center M.D.   On: 11/12/2022 04:56     Medications:    [MAR Hold] sodium chloride     [MAR Hold] anticoagulant sodium citrate     [MAR Hold]  ceFAZolin (ANCEF) IV      [MAR Hold] Chlorhexidine Gluconate Cloth  6 each Topical Q0600   [MAR Hold] insulin aspart  0-6 Units Subcutaneous TID WC   [MAR Hold] sodium chloride flush  3 mL Intravenous Q12H   [MAR Hold] sodium chloride, [MAR Hold] alteplase, [MAR Hold] anticoagulant sodium citrate, [MAR Hold]  HYDROmorphone (DILAUDID) injection, [MAR Hold] lidocaine (PF), [MAR Hold] lidocaine-prilocaine, [MAR  Hold] ondansetron **OR** [MAR Hold] ondansetron (ZOFRAN) IV, [MAR Hold] pentafluoroprop-tetrafluoroeth, [MAR Hold] sodium chloride flush  Assessment/ Plan:  Mr. BAYLEE MCCORKEL is a 89 y.o.  male with medical concerns including CAD status post CABG, hypothyroidism, hypertension, CHF, type 2 diabetes, and end-stage renal disease on hemodialysis.   [facility] MWF [facility] [address] RIJ permcath 98.5kg   End stage renal disease on hemodialysis. Will defer treatment today in lieu of surgery. Labs stable. Will provide hemodialysis on Saturday. Will return to outpatient schedule next week  2. Anemia of chronic kidney disease Lab Results  Component Value Date   HGB 12.3 (L) 11/12/2022    Hgb acceptable, patient receives mircera at outpatient clinic  3. .Secondary Hyperparathyroidism: with outpatient labs: PTH 320, phosphorus 5.5, calcium 9.0 on 11/10/22.   Lab Results  Component Value Date   CALCIUM 8.6 (L) 11/12/2022   CAION 1.16 04/15/2021   PHOS 4.3 02/14/2021    Patient receives calcium carbonate outpatient. Bone minerals acceptable at this time.   4.  Hypertension with chronic kidney disease.  Home regimen includes furosemide and metoprolol.   LOS: 0   6/21/20242:58 PM

## 2022-11-12 NOTE — Assessment & Plan Note (Signed)
2d ECHO 12/2017 w/ EF 45% Looks relatively euvolemic on presentation  Follow up formal recommendations by Dr. Welton Flakes

## 2022-11-12 NOTE — Anesthesia Procedure Notes (Signed)
Procedure Name: LMA Insertion Date/Time: 11/12/2022 1:19 PM  Performed by: Malva Cogan, CRNAPre-anesthesia Checklist: Patient identified, Patient being monitored, Timeout performed, Emergency Drugs available and Suction available Patient Re-evaluated:Patient Re-evaluated prior to induction Oxygen Delivery Method: Circle system utilized Preoxygenation: Pre-oxygenation with 100% oxygen Induction Type: IV induction Ventilation: Mask ventilation without difficulty LMA: LMA inserted LMA Size: 4.0 Tube type: Oral Number of attempts: 1 Placement Confirmation: positive ETCO2 and breath sounds checked- equal and bilateral Tube secured with: Tape Dental Injury: Teeth and Oropharynx as per pre-operative assessment

## 2022-11-12 NOTE — Plan of Care (Signed)
Patient A&Ox4, from home, up with assist in room. Pain controlled with medication, ice, and repositioning. Plan is for dialysis 6/22 and work with PT/OT.

## 2022-11-12 NOTE — Assessment & Plan Note (Signed)
Rate controlled present Cont home regimen  Pending formal cardiology consult pre-operatively

## 2022-11-12 NOTE — Assessment & Plan Note (Signed)
Baseline ESRD on HD MWF Dr. Cherylann Ratel consutled  Tentative plan for HD in am

## 2022-11-12 NOTE — Consult Note (Signed)
Matthew [name] is a 89 y.o. male  [id number]  Primary Cardiologist: Adrian Blackwater Reason for Consultation: Hip fracture  HPI: This is a 89 year old white male with a history of CABG and atrial fibrillation and renal insufficiency presented to the hospital after hip fracture.  Patient states he was sitting on commode and he felt a pop and then he was brought to the hospital where he had a hip fracture and needs surgery today.  Patient denies any chest pain shortness of breath palpitation orthopnea PND or leg swelling.   Review of Systems: No chest pain   Past Medical History:  Diagnosis Date   Anginal pain (HCC)    Arthritis    Ascending aortic aneurysm (HCC) 07/18/2015   a.) TTE 07/18/2015: severe dilation of ascending aorta measuring 7.0 cm. b.) TTE 01/10/2018: aortic root 3.8 cm; ascending aorta 6.8 cm; refused surgical intervention/repair.   Atrial fibrillation (HCC)    a.) CHA2DS2-VASc = 6 (age x 2, HFrEF, HTN, previous MI, T2DM). b.) rate/rhythm maintained on amiodarone + metoprolol succinate; chronic antiplatelet therapy using clopidogrel   Bradycardia    Broken arm 05/2013   Left   Cardiac murmur    a.) RIGHT upper sternal border   Cardiomyopathy [facility])    Coronary artery disease    DOE (dyspnea on exertion)    ESRD (end stage renal disease) (HCC)    GERD (gastroesophageal reflux disease)    HFrEF (heart failure with reduced ejection fraction) (HCC)    a.) TTE 12/03/2013: mod dec LV function; EF 35-40%; severe inferolateral and inferior HK; LA dilated; G3DD; PASP 52 mmHg. b.) TTE 07/18/2015: mild LV dysfunction with mild LVH; EF 45%; mild BAE. c.) TTE 01/10/2018: mod LV dysfunction; EF 45%; diffuse HK   HLD (hyperlipidemia)    Hypertension    IDA (iron deficiency anemia)    Long term current use of antithrombotics/antiplatelets    a.) clopidogrel   Myocardial infarction (HCC) 2000   Peripheral edema    Pneumonia    S/P CABG x 4 2001   a.) LIMA-LAD, SVG-D1, SVG-OM1,  SVG-OM2, SVG-PDA   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   left frontal scalp, EDC   Squamous cell carcinoma of skin 05/05/2020   left temporal scalp, in situ, EDC 05/13/20   T2DM (type 2 diabetes mellitus) (HCC)    Valvular insufficiency 12/03/2013   a.) TTE 12/03/2013: EF 35-40%; mild AR/MR, b.) TTE 07/18/2015: EF 45%; mild AR/PR, mod TR, severe MR. c.) TTE 01/10/2018: mod AR/TR    (Not in a hospital admission)     Chlorhexidine Gluconate Cloth  6 each Topical Q0600   insulin aspart  0-6 Units Subcutaneous TID WC   sodium chloride flush  3 mL Intravenous Q12H    Infusions:  sodium chloride     anticoagulant sodium citrate      ceFAZolin (ANCEF) IV      Allergies  Allergen Reactions   Other     Other reaction(s): Myalgias (intolerance), Other (See Comments) Statin Drugs  Statin Drugs     Amoxicillin Diarrhea    Social History   Socioeconomic History   Marital status: Widowed    Spouse name: Not on file   Number of children: Not on file   Years of education: Not on file   Highest education level: Not on file  Occupational History   Not on file  Tobacco Use   Smoking status: Former    Types: Pipe, Cigarettes    Quit date:  1992    Years since quitting: 32.4   Smokeless tobacco: Never  Vaping Use   Vaping Use: Never used  Substance and Sexual Activity   Alcohol use: Not Currently    Alcohol/week: 1.0 standard drink of alcohol    Types: 1 Cans of beer per week    Comment: occasionally drinking only,once a month   Drug use: No   Sexual activity: Never  Other Topics Concern   Not on file  Social History Narrative   Lives with daughter and son in law   Social Determinants of Health   Financial Resource Strain: Low Risk  (01/10/2018)   Overall Financial Resource Strain (CARDIA)    Difficulty of Paying Living Expenses: Not hard at all  Food Insecurity: No Food Insecurity (11/12/2022)   Hunger Vital Sign    Worried About Running Out of Food in the Last  Year: Never true    Ran Out of Food in the Last Year: Never true  Transportation Needs: No Transportation Needs (11/12/2022)   PRAPARE - Administrator, Civil Service (Medical): No    Lack of Transportation (Non-Medical): No  Physical Activity: Inactive (01/10/2018)   Exercise Vital Sign    Days of Exercise per Week: 0 days    Minutes of Exercise per Session: 0 min  Stress: No Stress Concern Present (01/10/2018)   [employer] of Occupational Health - Occupational Stress Questionnaire    Feeling of Stress : Not at all  Social Connections: Socially Integrated (01/10/2018)   Social Connection and Isolation Panel [NHANES]    Frequency of Communication with Friends and Family: More than three times a week    Frequency of Social Gatherings with Friends and Family: More than three times a week    Attends Religious Services: More than 4 times per year    Active Member of [employer] or Organizations: Yes    Attends Engineer, structural: More than 4 times per year    Marital Status: Married  Catering manager Violence: Not At Risk (11/12/2022)   Humiliation, Afraid, Rape, and Kick questionnaire    Fear of Current or Ex-Partner: No    Emotionally Abused: No    Physically Abused: No    Sexually Abused: No    Family History  Problem Relation Age of Onset   Heart attack Father 25       died first MI at age 20   Heart failure Mother     PHYSICAL EXAM: Vitals:   11/12/22 0337 11/12/22 0515  BP: (!) 123/57 137/75  Pulse: (!) 57 75  Resp: 16 19  Temp: 98.3 F (36.8 C)   SpO2: 100% 99%    No intake or output data in the 24 hours ending 11/12/22 1032  General:  Well appearing. No respiratory difficulty HEENT: normal Neck: supple. no JVD. Carotids 2+ bilat; no bruits. No lymphadenopathy or thryomegaly appreciated. Cor: PMI nondisplaced. Regular rate & rhythm. No rubs, gallops or murmurs. Lungs: clear Abdomen: soft, nontender, nondistended. No hepatosplenomegaly. No  bruits or masses. Good bowel sounds. Extremities: no cyanosis, clubbing, rash, edema Neuro: alert & oriented x 3, cranial nerves grossly intact. moves all 4 extremities w/o difficulty. Affect pleasant.  ECG: Atrial fibrillation with controlled ventricular rate nonspecific ST-T changes  Results for orders placed or performed during the hospital encounter of 11/12/22 (from the past 24 hour(s))  Basic metabolic panel     Status: Abnormal   Collection Time: 11/12/22  4:04 AM  Result Value Ref  Range   Sodium 134 (L) 135 - 145 mmol/L   Potassium 4.7 3.5 - 5.1 mmol/L   Chloride 98 98 - 111 mmol/L   CO2 27 22 - 32 mmol/L   Glucose, Bld 131 (H) 70 - 99 mg/dL   BUN 87 (H) 8 - 23 mg/dL   Creatinine, Ser 1.61 (H) 0.61 - 1.24 mg/dL   Calcium 8.6 (L) 8.9 - 10.3 mg/dL   GFR, Estimated 14 (L) >60 mL/min   Anion gap 9 5 - 15  CBC with Differential     Status: Abnormal   Collection Time: 11/12/22  4:04 AM  Result Value Ref Range   WBC 4.6 4.0 - 10.5 K/uL   RBC 3.78 (L) 4.22 - 5.81 MIL/uL   Hemoglobin 12.3 (L) 13.0 - 17.0 g/dL   HCT 09.6 (L) 04.5 - 40.9 %   MCV 98.1 80.0 - 100.0 fL   MCH 32.5 26.0 - 34.0 pg   MCHC 33.2 30.0 - 36.0 g/dL   RDW 81.1 91.4 - 78.2 %   Platelets 110 (L) 150 - 400 K/uL   nRBC 0.0 0.0 - 0.2 %   Neutrophils Relative % 55 %   Neutro Abs 2.6 1.7 - 7.7 K/uL   Lymphocytes Relative 15 %   Lymphs Abs 0.7 0.7 - 4.0 K/uL   Monocytes Relative 17 %   Monocytes Absolute 0.8 0.1 - 1.0 K/uL   Eosinophils Relative 11 %   Eosinophils Absolute 0.5 0.0 - 0.5 K/uL   Basophils Relative 1 %   Basophils Absolute 0.0 0.0 - 0.1 K/uL   Immature Granulocytes 1 %   Abs Immature Granulocytes 0.03 0.00 - 0.07 K/uL  Protime-INR     Status: None   Collection Time: 11/12/22  6:18 AM  Result Value Ref Range   Prothrombin Time 14.2 11.4 - 15.2 seconds   INR 1.1 0.8 - 1.2  APTT     Status: None   Collection Time: 11/12/22  6:18 AM  Result Value Ref Range   aPTT 26 24 - 36 seconds   Acetaminophen level     Status: Abnormal   Collection Time: 11/12/22  9:03 AM  Result Value Ref Range   Acetaminophen (Tylenol), Serum <10 (L) 10 - 30 ug/mL   DG Chest Port 1 View  Result Date: 11/12/2022 CLINICAL DATA:  Right femoral neck fracture. EXAM: PORTABLE CHEST 1 VIEW COMPARISON:  02/11/2021 FINDINGS: The cardio pericardial silhouette is enlarged. Status post CABG. Mild vascular congestion without edema. No focal consolidation or pleural effusion. Vascular stent device noted medial left arm. Bones are diffusely demineralized. IMPRESSION: Enlargement of the cardiopericardial silhouette with mild vascular congestion. Electronically Signed   By: Kennith Center M.D.   On: 11/12/2022 04:59   DG Hip Unilat W or Wo Pelvis 2-3 Views Right  Result Date: 11/12/2022 CLINICAL DATA:  Fall.  Right hip pain. EXAM: DG HIP (WITH OR WITHOUT PELVIS) 2-3V RIGHT COMPARISON:  None Available. FINDINGS: Diffuse osteopenia. SI joints and symphysis pubis unremarkable. Deformity of the right superior inferior pubic rami compatible with healed fractures. Acute comminuted intertrochanteric fracture of the right femoral neck evident with varus angulation. IMPRESSION: Acute comminuted intertrochanteric fracture of the right femoral neck with varus angulation. Electronically Signed   By: Kennith Center M.D.   On: 11/12/2022 04:58   DG Femur Min 2 Views Right  Result Date: 11/12/2022 CLINICAL DATA:  Fall. EXAM: RIGHT FEMUR 2 VIEWS COMPARISON:  None Available. FINDINGS: Two view show study shows diffuse osteopenia.  Comminuted intertrochanteric fracture of the femoral neck evident with varus angulation. Deformity in the right superior inferior pubic rami suggest remote trauma. Degenerative changes are evident at the knee. IMPRESSION: Comminuted intertrochanteric fracture of the right femoral neck with varus angulation. Electronically Signed   By: Kennith Center M.D.   On: 11/12/2022 04:56     ASSESSMENT AND PLAN:  Preoperative clearance, for hip fracture.  Patient has extensive history of coronary artery disease with history of CABG and atrial fibrillation and stage IV renal failure.  Patient denies chest pain shortness of breath orthopnea PND or leg swelling.  EKG has no acute changes.  Advise proceeding with surgery.  Izabellah Dadisman Welton Flakes

## 2022-11-12 NOTE — Discharge Planning (Signed)
ESTABLISHED HEMODIALYSIS Outpatient Facility: [facility]  [address] [place], [place] [address] [phone]  Scheduled days: Monday Wednesday and Friday  Treatment time: 11:00am

## 2022-11-12 NOTE — Assessment & Plan Note (Signed)
PPI ?

## 2022-11-12 NOTE — Assessment & Plan Note (Signed)
Mechanical fall at home w/ secondary R hip fracture  No head trauma or LOC  Pending operative repair w/ Dr. Rico Ala  PT/OT eval  Follow

## 2022-11-12 NOTE — Assessment & Plan Note (Signed)
SSI  A1C  

## 2022-11-12 NOTE — Plan of Care (Signed)
  Problem: Education: Goal: Knowledge of General Education information will improve Description: Including pain rating scale, medication(s)/side effects and non-pharmacologic comfort measures Outcome: Progressing   Problem: Health Behavior/Discharge Planning: Goal: Ability to manage health-related needs will improve Outcome: Progressing   Problem: Clinical Measurements: Goal: Ability to maintain clinical measurements within normal limits will improve Outcome: Progressing Goal: Will remain free from infection Outcome: Progressing Goal: Diagnostic test results will improve Outcome: Progressing Goal: Respiratory complications will improve Outcome: Progressing Goal: Cardiovascular complication will be avoided Outcome: Progressing   Problem: Activity: Goal: Risk for activity intolerance will decrease Outcome: Progressing   Problem: Nutrition: Goal: Adequate nutrition will be maintained Outcome: Progressing   Problem: Coping: Goal: Level of anxiety will decrease Outcome: Progressing   Problem: Elimination: Goal: Will not experience complications related to bowel motility Outcome: Progressing Goal: Will not experience complications related to urinary retention Outcome: Progressing   Problem: Pain Managment: Goal: General experience of comfort will improve Outcome: Progressing   Problem: Safety: Goal: Ability to remain free from injury will improve Outcome: Progressing   Problem: Skin Integrity: Goal: Risk for impaired skin integrity will decrease Outcome: Progressing   Problem: Education: Goal: Ability to describe self-care measures that may prevent or decrease complications (Diabetes Survival Skills Education) will improve Outcome: Progressing Goal: Individualized Educational Video(s) Outcome: Progressing   Problem: Coping: Goal: Ability to adjust to condition or change in health will improve Outcome: Progressing   Problem: Fluid Volume: Goal: Ability to  maintain a balanced intake and output will improve Outcome: Progressing   Problem: Health Behavior/Discharge Planning: Goal: Ability to identify and utilize available resources and services will improve Outcome: Progressing Goal: Ability to manage health-related needs will improve Outcome: Progressing   Problem: Metabolic: Goal: Ability to maintain appropriate glucose levels will improve Outcome: Progressing   Problem: Nutritional: Goal: Maintenance of adequate nutrition will improve Outcome: Progressing Goal: Progress toward achieving an optimal weight will improve Outcome: Progressing   Problem: Skin Integrity: Goal: Risk for impaired skin integrity will decrease Outcome: Progressing   Problem: Tissue Perfusion: Goal: Adequacy of tissue perfusion will improve Outcome: Progressing   Problem: Education: Goal: Verbalization of understanding the information provided (i.e., activity precautions, restrictions, etc) will improve Outcome: Progressing Goal: Individualized Educational Video(s) Outcome: Progressing   Problem: Activity: Goal: Ability to ambulate and perform ADLs will improve Outcome: Progressing   Problem: Clinical Measurements: Goal: Postoperative complications will be avoided or minimized Outcome: Progressing   Problem: Self-Concept: Goal: Ability to maintain and perform role responsibilities to the fullest extent possible will improve Outcome: Progressing   Problem: Pain Management: Goal: Pain level will decrease Outcome: Progressing   

## 2022-11-12 NOTE — Assessment & Plan Note (Signed)
AST of 293 and ALT of 252 Appears to have been evaluated outpatient for this issue May 2024 right upper quadrant ultrasound negative for any acute findings Recent hepatic serologies within normal limits Will check Tylenol level ?  Element of hepatic congestion in setting of HFrEF and ESRD Monitor for now Consider formal GI consult as clinically indicated

## 2022-11-12 NOTE — Transfer of Care (Signed)
Immediate Anesthesia Transfer of Care Note  Patient: Matthew [name]  Procedure(s) Performed: INTRAMEDULLARY (IM) NAIL INTERTROCHANTERIC (Right: Hip)  Patient Location: PACU  Anesthesia Type:General  Level of Consciousness: drowsy  Airway & Oxygen Therapy: Patient Spontanous Breathing and Patient connected to face mask oxygen  Post-op Assessment: Report given to RN and Post -op Vital signs reviewed and stable  Post vital signs: Reviewed and stable  Last Vitals:  Vitals Value Taken Time  BP 156/48 11/12/22 1452  Temp 36.4 C 11/12/22 1452  Pulse 58 11/12/22 1458  Resp 23 11/12/22 1458  SpO2 100 % 11/12/22 1458  Vitals shown include unvalidated device data.  Last Pain:  Vitals:   11/12/22 1452  TempSrc:   PainSc: Asleep         Complications: No notable events documented.

## 2022-11-12 NOTE — ED Provider Notes (Addendum)
[facility] Provider Note    Event Date/Time   First MD Initiated Contact with Patient 11/12/22 [phone]     (approximate)   History   Hip Injury   HPI  Matthew [name] is a 89 y.o. male   Past medical history of end-stage renal disease on hemodialysis on a MWF, CAD, atrial fibrillation most recently noted to be in sinus rhythm on sotalol (no ac) , heart failure with reduced ejection fraction, hypertension, hyperlipidemia, iron deficient anemia, type II diabetic who presents emergency department with a slip and fall while sitting on the edge of his bed getting ready to get up to use the bathroom he slid off the bed onto his buttocks and sustained a right hip injury.  He denies head strike or loss of consciousness.  He denies any other acute injuries.  He has otherwise been in his regular state of health no recent illnesses no other acute medical complaints.  EMS gave him 75 mcg of fentanyl en route.  Independent Historian contributed to assessment above: His daughter who is at bedside lives with him corroborates information given above.      Physical Exam   Triage Vital Signs: ED Triage Vitals  Enc Vitals Group     BP 11/12/22 0337 (!) 123/57     Pulse Rate 11/12/22 0337 (!) 57     Resp 11/12/22 0337 16     Temp 11/12/22 0337 98.3 F (36.8 C)     Temp Source 11/12/22 0337 Oral     SpO2 11/12/22 0336 100 %     Weight 11/12/22 0338 204 lb (92.5 kg)     Height --      Head Circumference --      Peak Flow --      Pain Score 11/12/22 0338 2     Pain Loc --      Pain Edu? --      Excl. in GC? --     Most recent vital signs: Vitals:   11/12/22 0337 11/12/22 0515  BP: (!) 123/57 137/75  Pulse: (!) 57 75  Resp: 16 19  Temp: 98.3 F (36.8 C)   SpO2: 100% 99%    General: Awake, no distress.  CV:  Good peripheral perfusion.  Resp:  Normal effort.  Abd:  No distention.  Other:  Shortening externally rotated right hip.  The remainder of that  extremity appears atraumatic, neurovascular intact.  No signs of head trauma.  Awake alert oriented.   ED Results / Procedures / Treatments   Labs (all labs ordered are listed, but only abnormal results are displayed) Labs Reviewed  BASIC METABOLIC PANEL - Abnormal; Notable for the following components:      Result Value   Sodium 134 (*)    Glucose, Bld 131 (*)    BUN 87 (*)    Creatinine, Ser 3.93 (*)    Calcium 8.6 (*)    GFR, Estimated 14 (*)    All other components within normal limits  CBC WITH DIFFERENTIAL/PLATELET - Abnormal; Notable for the following components:   RBC 3.78 (*)    Hemoglobin 12.3 (*)    HCT 37.1 (*)    Platelets 110 (*)    All other components within normal limits     I ordered and reviewed the above labs they are notable for creatinine 3.93   RADIOLOGY I independently reviewed and interpreted x-ray of the right hip and I see an intertrochanteric femur fracture  PROCEDURES:  Critical Care performed: No  Procedures   MEDICATIONS ORDERED IN ED: Medications  HYDROmorphone (DILAUDID) injection 0.5 mg (0.5 mg Intravenous Given 11/12/22 0447)     I spoke with orthopedic surgeon Dr. Martha Clan about the care plan for this patient.  IMPRESSION / MDM / ASSESSMENT AND PLAN / ED COURSE  I reviewed the triage vital signs and the nursing notes.                                Patient's presentation is most consistent with acute presentation with potential threat to life or bodily function.  Differential diagnosis includes, but is not limited to, hip fracture, dislocation   The patient is on the cardiac monitor to evaluate for evidence of arrhythmia and/or significant heart rate changes.  MDM: Mechanical slip and fall from bedside with right hip injury found to have a right-sided intertrochanteric femoral neck fracture.  Orthopedics consulted.  Admit to hospitalist service.  Will require renal consultation for hemodialysis while admitted.         FINAL CLINICAL IMPRESSION(S) / ED DIAGNOSES   Final diagnoses:  Intertrochanteric fracture of right femur, closed, initial encounter (HCC)  Fall, initial encounter  End-stage renal disease on hemodialysis (HCC)     Rx / DC Orders   ED Discharge Orders     None        Note:  This document was prepared using Dragon voice recognition software and may include unintentional dictation errors.    Pilar Jarvis, MD 11/12/22 [facility]    Pilar Jarvis, MD 11/12/22 [phone]

## 2022-11-12 NOTE — Assessment & Plan Note (Signed)
S/p 5 vessel CABG 2001  No active CP  EKG stable  Follow up formal cardiology recommendations

## 2022-11-12 NOTE — Anesthesia Preprocedure Evaluation (Addendum)
Anesthesia Evaluation  Patient identified by MRN, date of birth, ID band Patient awake    Reviewed: Allergy & Precautions, H&P , NPO status , Patient's Chart, lab work & pertinent test results, reviewed documented beta blocker date and time   Airway Mallampati: II   Neck ROM: full    Dental  (+) Poor Dentition   Pulmonary shortness of breath, pneumonia, former smoker   Pulmonary exam normal        Cardiovascular Exercise Tolerance: Poor hypertension, On Medications + angina  + CAD, + Past MI, +CHF and + DOE  Normal cardiovascular exam+ Valvular Problems/Murmurs  Rhythm:regular Rate:Normal     Neuro/Psych negative neurological ROS  negative psych ROS   GI/Hepatic Neg liver ROS,GERD  Medicated,,  Endo/Other  diabetes, Well ControlledHypothyroidism    Renal/GU CRFRenal disease  negative genitourinary   Musculoskeletal  (+) Arthritis ,    Abdominal   Peds  Hematology  (+) Blood dyscrasia, anemia   Anesthesia Other Findings Past Medical History: No date: Anginal pain (HCC) No date: Arthritis 07/18/2015: Ascending aortic aneurysm (HCC)     Comment:  a.) TTE 07/18/2015: severe dilation of ascending aorta               measuring 7.0 cm. b.) TTE 01/10/2018: aortic root 3.8 cm;              ascending aorta 6.8 cm; refused surgical               intervention/repair. No date: Atrial fibrillation (HCC)     Comment:  a.) CHA2DS2-VASc = 6 (age x 2, HFrEF, HTN, previous MI,               T2DM). b.) rate/rhythm maintained on amiodarone +               metoprolol succinate; chronic antiplatelet therapy using               clopidogrel No date: Bradycardia 05/2013: Broken arm     Comment:  Left No date: Cardiac murmur     Comment:  a.) RIGHT upper sternal border No date: Cardiomyopathy (HCC) No date: Coronary artery disease No date: DOE (dyspnea on exertion) No date: ESRD (end stage renal disease) (HCC) No date: GERD  (gastroesophageal reflux disease) No date: HFrEF (heart failure with reduced ejection fraction) (HCC)     Comment:  a.) TTE 12/03/2013: mod dec LV function; EF 35-40%;               severe inferolateral and inferior HK; LA dilated; G3DD;               PASP 52 mmHg. b.) TTE 07/18/2015: mild LV dysfunction               with mild LVH; EF 45%; mild BAE. c.) TTE 01/10/2018: mod               LV dysfunction; EF 45%; diffuse HK No date: HLD (hyperlipidemia) No date: Hypertension No date: IDA (iron deficiency anemia) No date: Long term current use of antithrombotics/antiplatelets     Comment:  a.) clopidogrel 2000: Myocardial infarction (HCC) No date: Peripheral edema No date: Pneumonia 2001: S/P CABG x 4     Comment:  a.) LIMA-LAD, SVG-D1, SVG-OM1, SVG-OM2, SVG-PDA 10/2019: Squamous cell carcinoma of scalp     Comment:  left frontal scalp, [facility] 05/05/2020: Squamous cell carcinoma of skin     Comment:  left temporal scalp, in situ, [facility] 05/13/20  No date: T2DM (type 2 diabetes mellitus) (HCC) 12/03/2013: Valvular insufficiency     Comment:  a.) TTE 12/03/2013: EF 35-40%; mild AR/MR, b.) TTE               07/18/2015: EF 45%; mild AR/PR, mod TR, severe MR. c.)               TTE 01/10/2018: mod AR/TR Past Surgical History: 04/01/2022: A/V SHUNTOGRAM; Left     Comment:  Procedure: A/V SHUNTOGRAM;  Surgeon: Annice Needy, MD;                Location: [facility];  Service: Cardiovascular;              Laterality: Left; 04/15/2021: AV FISTULA PLACEMENT; Left     Comment:  Procedure: INSERTION OF ARTERIOVENOUS (AV) GORE-TEX               GRAFT ARM ( BRACHIAL AXILLARY);  Surgeon: Annice Needy,               MD;  Location: [facility] ORS;  Service: Vascular;  Laterality:              Left; 07/26/2017: CATARACT EXTRACTION W/PHACO; Left     Comment:  Procedure: CATARACT EXTRACTION PHACO AND INTRAOCULAR               LENS PLACEMENT (IOC);  Surgeon: Galen Manila, MD;                Location:  [facility] ORS;  Service: Ophthalmology;  Laterality:              Left;  [place]   00:45.8 AP%  13.1 CDE  6.00 Fluid Pack Lot              # [id number] 08/17/2017: CATARACT EXTRACTION W/PHACO; Right     Comment:  Procedure: CATARACT EXTRACTION PHACO AND INTRAOCULAR               LENS PLACEMENT (IOC);  Surgeon: Galen Manila, MD;                Location: [facility] ORS;  Service: Ophthalmology;  Laterality:              Right;  [place]  01:30 AP% 16.8 CDE 15.24 Fluid pack lot #               [id number] No date: CHOLECYSTECTOMY 2001: CORONARY ARTERY BYPASS GRAFT; N/A     Comment:  Procedure: 5v CABG (LIMA-LAD, SVG-D1, SVG-OM1, SVG-OM2,               SVG-PDA) 02/09/2018: DIALYSIS/PERMA CATHETER INSERTION; N/A     Comment:  Procedure: DIALYSIS/PERMA CATHETER INSERTION;  Surgeon:               Annice Needy, MD;  Location: [facility];                Service: Cardiovascular;  Laterality: N/A; 06/25/2021: DIALYSIS/PERMA CATHETER REMOVAL; N/A     Comment:  Procedure: DIALYSIS/PERMA CATHETER REMOVAL;  Surgeon:               Annice Needy, MD;  Location: [facility];                Service: Cardiovascular;  Laterality: N/A; 2015: FRACTURE SURGERY; Left     Comment:  LEFT arm 11/03/2020: INSERTION OF DIALYSIS CATHETER; Right     Comment:  Procedure:  INSERTION OF Perm Cath in the RIGHT INTERNAL               JUGULAR;  Surgeon: Annice Needy, MD;  Location: [facility] ORS;              Service: Vascular;  Laterality: Right; 12/04/2013: LEFT HEART CATHETERIZATION WITH CORONARY/GRAFT ANGIOGRAM;  Left     Comment:  Procedure: LEFT HEART CATHETERIZATION WITH               Isabel Caprice;  Surgeon: Marykay Lex, MD;               Location: [facility] CATH LAB;  Service: Cardiovascular;                Laterality: Left; 02/06/2009: LEFT HEART CATHETERIZATION WITH CORONARY/GRAFT ANGIOGRAM;  Left     Comment:  Procedure: LEFT HEART CATHETERIZATION WITH               CORONARY/GRAFT ANGIOGRAM; Location:  [facility]; Surgeon:               Despina Hick, MD 12/06/2013: PERCUTANEOUS CORONARY STENT INTERVENTION (PCI-S); N/A     Comment:  Procedure: STAGED PERCUTANEOUS CORONARY STENT               INTERVENTION (overlapping 4.0 x 38 mm (mid) and 4.0 x 28               mm (ostial) Promus Primier DES to SVG-RPDA graft);                Surgeon: Lesleigh Noe, MD;  Location: [facility] CATH LAB;                Service: Cardiovascular 11/03/2020: REMOVAL OF A DIALYSIS CATHETER; N/A     Comment:  Procedure: REMOVAL OF A DIALYSIS CATHETER;  Surgeon:               Annice Needy, MD;  Location: [facility] ORS;  Service:               Vascular;  Laterality: N/A; BMI    Body Mass Index: 27.67 kg/m     Reproductive/Obstetrics negative OB ROS                             Anesthesia Physical Anesthesia Plan  ASA: 4 and emergent  Anesthesia Plan: General   Post-op Pain Management:    Induction:   PONV Risk Score and Plan:   Airway Management Planned:   Additional Equipment:   Intra-op Plan:   Post-operative Plan:   Informed Consent: I have reviewed the patients History and Physical, chart, labs and discussed the procedure including the risks, benefits and alternatives for the proposed anesthesia with the patient or authorized representative who has indicated his/her understanding and acceptance.     Dental Advisory Given  Plan Discussed with: CRNA  Anesthesia Plan Comments: (Based on cardiac parameters, patient is at high perioperative  risk.  Pt is aware. Risks and Benefits have been reviewed in detail JA)       Anesthesia Quick Evaluation

## 2022-11-12 NOTE — H&P (Signed)
History and Physical    Patient: Matthew [name] VHQ:[id number] DOB: [birth year] DOA: 11/12/2022 DOS: the patient was seen and examined on 11/12/2022 PCP: Sherron Monday, MD  Patient coming from: Home  Chief Complaint:  Chief Complaint  Patient presents with   Hip Injury   HPI: CAIUS SILBERNAGEL is a 89 y.o. male with medical history significant of multiple medical issues including atrial fibrillation, HFrEF, coronary artery disease, end-stage renal disease on hemodialysis Monday Wednesday Friday, GERD, hypertension, hyperlipidemia, type 2 diabetes presenting with fall and right hip fracture.  History from patient as well as daughter.  Per the daughter, patient went to get up to use the bathroom.  Slipped and fell on his right hip with secondary hip fracture.  No reported head trauma loss conscious.  No reported weakness or dizziness prior to episode.  Baseline ESRD on hemodialysis Monday Wednesday Friday.  No missed dialysis sessions.  Follows Dr. Welton Flakes outpatient from a cardiology standpoint.  Has had some mild medication changes secondary to elevated liver function, though daughter is not fully aware of which medications.  No chest pain or belly pain.  No nausea or vomiting.  No hemiparesis or confusion.  No dizziness. Presented to the ER afebrile, hemodynamically stable.  Satting well on room air.  White count 4.6, hemoglobin 12.3, platelets 110, creatinine 3.93.  Imaging with comminuted intertrochanteric fracture of the right femoral neck with varus angulation.  Chest x-ray with mild vascular congestion.  Dr. Martha Clan with orthopedic surgery aware of case planning operative intervention. Review of Systems: As mentioned in the history of present illness. All other systems reviewed and are negative. Past Medical History:  Diagnosis Date   Anginal pain (HCC)    Arthritis    Ascending aortic aneurysm (HCC) 07/18/2015   a.) TTE 07/18/2015: severe dilation of ascending aorta measuring 7.0 cm. b.)  TTE 01/10/2018: aortic root 3.8 cm; ascending aorta 6.8 cm; refused surgical intervention/repair.   Atrial fibrillation (HCC)    a.) CHA2DS2-VASc = 6 (age x 2, HFrEF, HTN, previous MI, T2DM). b.) rate/rhythm maintained on amiodarone + metoprolol succinate; chronic antiplatelet therapy using clopidogrel   Bradycardia    Broken arm 05/2013   Left   Cardiac murmur    a.) RIGHT upper sternal border   Cardiomyopathy [facility])    Coronary artery disease    DOE (dyspnea on exertion)    ESRD (end stage renal disease) (HCC)    GERD (gastroesophageal reflux disease)    HFrEF (heart failure with reduced ejection fraction) (HCC)    a.) TTE 12/03/2013: mod dec LV function; EF 35-40%; severe inferolateral and inferior HK; LA dilated; G3DD; PASP 52 mmHg. b.) TTE 07/18/2015: mild LV dysfunction with mild LVH; EF 45%; mild BAE. c.) TTE 01/10/2018: mod LV dysfunction; EF 45%; diffuse HK   HLD (hyperlipidemia)    Hypertension    IDA (iron deficiency anemia)    Long term current use of antithrombotics/antiplatelets    a.) clopidogrel   Myocardial infarction (HCC) 2000   Peripheral edema    Pneumonia    S/P CABG x 4 2001   a.) LIMA-LAD, SVG-D1, SVG-OM1, SVG-OM2, SVG-PDA   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   left frontal scalp, EDC   Squamous cell carcinoma of skin 05/05/2020   left temporal scalp, in situ, EDC 05/13/20   T2DM (type 2 diabetes mellitus) (HCC)    Valvular insufficiency 12/03/2013   a.) TTE 12/03/2013: EF 35-40%; mild AR/MR, b.) TTE 07/18/2015: EF 45%; mild AR/PR, mod  TR, severe MR. c.) TTE 01/10/2018: mod AR/TR   Past Surgical History:  Procedure Laterality Date   A/V SHUNTOGRAM Left 04/01/2022   Procedure: A/V SHUNTOGRAM;  Surgeon: Annice Needy, MD;  Location: [facility] INVASIVE CV LAB;  Service: Cardiovascular;  Laterality: Left;   AV FISTULA PLACEMENT Left 04/15/2021   Procedure: INSERTION OF ARTERIOVENOUS (AV) GORE-TEX GRAFT ARM ( BRACHIAL AXILLARY);  Surgeon: Annice Needy, MD;   Location: [facility] ORS;  Service: Vascular;  Laterality: Left;   CATARACT EXTRACTION W/PHACO Left 07/26/2017   Procedure: CATARACT EXTRACTION PHACO AND INTRAOCULAR LENS PLACEMENT (IOC);  Surgeon: Galen Manila, MD;  Location: [facility] ORS;  Service: Ophthalmology;  Laterality: Left;  [place]   00:45.8 AP%  13.1 CDE  6.00 Fluid Pack Lot # [id number]   CATARACT EXTRACTION W/PHACO Right 08/17/2017   Procedure: CATARACT EXTRACTION PHACO AND INTRAOCULAR LENS PLACEMENT (IOC);  Surgeon: Galen Manila, MD;  Location: [facility] ORS;  Service: Ophthalmology;  Laterality: Right;  [place]  01:30 AP% 16.8 CDE 15.24 Fluid pack lot # [id number]   CHOLECYSTECTOMY     CORONARY ARTERY BYPASS GRAFT N/A 2001   Procedure: 5v CABG (LIMA-LAD, SVG-D1, SVG-OM1, SVG-OM2, SVG-PDA)   DIALYSIS/PERMA CATHETER INSERTION N/A 02/09/2018   Procedure: DIALYSIS/PERMA CATHETER INSERTION;  Surgeon: Annice Needy, MD;  Location: [facility] INVAS[facility] CV LAB;  Service: Cardiovascular;  Laterality: N/A;   DIALYSIS/PERMA CATHETER REMOVAL N/A 06/25/2021   Procedure: DIALYSIS/PERMA CATHETER REMOVAL;  Surgeon: Annice Needy, MD;  Location: [facility] INVASIVE CV LAB;  Service: Cardiovascular;  Laterality: N/A;   FRACTURE SURGERY Left 2015   LEFT arm   INSERTION OF DIALYSIS CATHETER Right 11/03/2020   Procedure: INSERTION OF Perm Cath in the RIGHT INTERNAL JUGULAR;  Surgeon: Annice Needy, MD;  Location: [facility] ORS;  Service: Vascular;  Laterality: Right;   LEFT HEART CATHETERIZATION WITH CORONARY/GRAFT ANGIOGRAM Left 12/04/2013   Procedure: LEFT HEART CATHETERIZATION WITH Isabel Caprice;  Surgeon: Marykay Lex, MD;  Location: [facility] CATH LAB;  Service: Cardiovascular;  Laterality: Left;   LEFT HEART CATHETERIZATION WITH CORONARY/GRAFT ANGIOGRAM Left 02/06/2009   Procedure: LEFT HEART CATHETERIZATION WITH CORONARY/GRAFT ANGIOGRAM; Location: [facility]; Surgeon: Despina Hick, MD   PERCUTANEOUS CORONARY STENT INTERVENTION (PCI-S) N/A 12/06/2013   Procedure: STAGED  PERCUTANEOUS CORONARY STENT INTERVENTION (overlapping 4.0 x 38 mm (mid) and 4.0 x 28 mm (ostial) Promus Primier DES to SVG-RPDA graft);  Surgeon: Lesleigh Noe, MD;  Location: [facility] CATH LAB;  Service: Cardiovascular   REMOVAL OF A DIALYSIS CATHETER N/A 11/03/2020   Procedure: REMOVAL OF A DIALYSIS CATHETER;  Surgeon: Annice Needy, MD;  Location: [facility] ORS;  Service: Vascular;  Laterality: N/A;   Social History:  reports that he quit smoking about 32 years ago. His smoking use included pipe and cigarettes. He has never used smokeless tobacco. He reports that he does not currently use alcohol after a past usage of about 1.0 standard drink of alcohol per week. He reports that he does not use drugs.  Allergies  Allergen Reactions   Other     Other reaction(s): Myalgias (intolerance), Other (See Comments) Statin Drugs  Statin Drugs     Amoxicillin Diarrhea    Family History  Problem Relation Age of Onset   Heart attack Father 22       died first MI at age 58   Heart failure Mother     Prior to Admission medications   Medication Sig Start Date End Date Taking? Authorizing Provider  acyclovir ointment (ZOVIRAX) 5 %  Apply 1 application topically daily as needed (shingles).    [provider]  bisacodyl (DULCOLAX) 10 MG suppository Place 1 suppository (10 mg total) rectally daily as needed for moderate constipation. 01/05/21   Enedina Finner, MD  cholecalciferol (VITAMIN D) 25 MCG (1000 UNIT) tablet Take 1,000 Units by mouth daily with breakfast.    [provider]  clopidogrel (PLAVIX) 75 MG tablet TAKE 1 TABLET BY MOUTH ONCE  DAILY 07/13/22   Laurier Nancy, MD  ferrous sulfate 325 (65 FE) MG tablet Take 325 mg by mouth 2 (two) times daily with a meal.     [provider]  fluticasone (FLONASE) 50 MCG/ACT nasal spray Place 2 sprays into both nostrils daily as needed for allergies or rhinitis.    [provider]  furosemide (LASIX) 40 MG tablet Take 40 mg by  mouth.    [provider]  gabapentin (NEURONTIN) 300 MG capsule Take 1 capsule (300 mg total) by mouth at bedtime. 02/17/21   Delfino Lovett, MD  glipiZIDE (GLUCOTROL) 5 MG tablet Take 2.5 mg by mouth daily at 12 noon. 01/13/21   [provider]  levocetirizine (XYZAL) 5 MG tablet Take 10 mg by mouth every morning.     [provider]  levothyroxine (SYNTHROID) 100 MCG tablet TAKE 1 TABLET BY MOUTH IN THE  MORNING 08/04/22   Sherron Monday, MD  levothyroxine (SYNTHROID) 75 MCG tablet Take 1 tablet (75 mcg total) by mouth daily before breakfast. 02/18/21   Delfino Lovett, MD  metoprolol succinate (TOPROL-XL) 50 MG 24 hr tablet Take 50 mg by mouth daily.    [provider]  midodrine (PROAMATINE) 5 MG tablet Take 1 tablet (5 mg total) by mouth 3 (three) times daily with meals. Patient taking differently: Take 5 mg by mouth 3 (three) times daily as needed. 02/17/21   Delfino Lovett, MD  multivitamin (RENA-VIT) TABS tablet Take 1 tablet by mouth daily.    [provider]  nitroGLYCERIN (NITROSTAT) 0.4 MG SL tablet Place 0.4 mg under the tongue every 5 (five) minutes as needed for chest pain.    [provider]  Omega-3 Fatty Acids (FISH OIL) 1000 MG CAPS Take 4,000 mg by mouth daily with lunch.    [provider]  polyethylene glycol (MIRALAX / GLYCOLAX) 17 g packet Take 17 g by mouth daily. 01/06/21   Enedina Finner, MD  potassium chloride SA (KLOR-CON) 20 MEQ tablet Take 20 mEq by mouth daily.    [provider]  QUEtiapine (SEROQUEL) 25 MG tablet TAKE 1 TABLET BY MOUTH AT  BEDTIME 08/16/22   Sherron Monday, MD  rosuvastatin (CRESTOR) 40 MG tablet TAKE 1 TABLET BY MOUTH ONCE  DAILY 10/28/22   Laurier Nancy, MD  senna-docusate (SENOKOT-S) 8.6-50 MG tablet Take 2 tablets by mouth 2 (two) times daily. 01/05/21   Enedina Finner, MD  sotalol (BETAPACE) 80 MG tablet Take 1 tablet (80 mg total) by mouth 2 (two) times daily. 10/14/22   Laurier Nancy,  MD  Suvorexant (BELSOMRA) 10 MG TABS Take 10 mg by mouth at bedtime.    [provider]  traMADol (ULTRAM) 50 MG tablet Take 1 tablet (50 mg total) by mouth every 6 (six) hours as needed for moderate pain. 02/17/21   Delfino Lovett, MD    Physical Exam: Vitals:   11/12/22 8469 11/12/22 0338 11/12/22 0515 11/12/22 0738  BP: (!) 123/57  137/75   Pulse: (!) 57  75  Resp: 16  19   Temp: 98.3 F (36.8 C)     TempSrc: Oral     SpO2: 100%  99%   Weight:  92.5 kg    Height:    6' (1.829 m)   Physical Exam Constitutional:      Appearance: He is obese.  HENT:     Head: Normocephalic and atraumatic.     Nose: Nose normal.     Mouth/Throat:     Mouth: Mucous membranes are moist.  Eyes:     Pupils: Pupils are equal, round, and reactive to light.  Cardiovascular:     Rate and Rhythm: Normal rate and regular rhythm.  Pulmonary:     Effort: Pulmonary effort is normal.  Abdominal:     General: Bowel sounds are normal.  Musculoskeletal:     Comments: + TTP and decreased ROM on R hip    Skin:    General: Skin is warm.  Neurological:     General: No focal deficit present.  Psychiatric:        Mood and Affect: Mood normal.     Data Reviewed:  There are no new results to review at this time. DG Chest Port 1 View CLINICAL DATA:  Right femoral neck fracture.  EXAM: PORTABLE CHEST 1 VIEW  COMPARISON:  02/11/2021  FINDINGS: The cardio pericardial silhouette is enlarged. Status post CABG. Mild vascular congestion without edema. No focal consolidation or pleural effusion. Vascular stent device noted medial left arm. Bones are diffusely demineralized.  IMPRESSION: Enlargement of the cardiopericardial silhouette with mild vascular congestion.  Electronically Signed   By: Kennith Center M.D.   On: 11/12/2022 04:59 DG Hip Unilat W or Wo Pelvis 2-3 Views Right CLINICAL DATA:  Fall.  Right hip pain.  EXAM: DG HIP (WITH OR WITHOUT PELVIS) 2-3V RIGHT  COMPARISON:  None  Available.  FINDINGS: Diffuse osteopenia. SI joints and symphysis pubis unremarkable. Deformity of the right superior inferior pubic rami compatible with healed fractures. Acute comminuted intertrochanteric fracture of the right femoral neck evident with varus angulation.  IMPRESSION: Acute comminuted intertrochanteric fracture of the right femoral neck with varus angulation.  Electronically Signed   By: Kennith Center M.D.   On: 11/12/2022 04:58 DG Femur Min 2 Views Right CLINICAL DATA:  Fall.  EXAM: RIGHT FEMUR 2 VIEWS  COMPARISON:  None Available.  FINDINGS: Two view show study shows diffuse osteopenia. Comminuted intertrochanteric fracture of the femoral neck evident with varus angulation. Deformity in the right superior inferior pubic rami suggest remote trauma. Degenerative changes are evident at the knee.  IMPRESSION: Comminuted intertrochanteric fracture of the right femoral neck with varus angulation.  Electronically Signed   By: Kennith Center M.D.   On: 11/12/2022 04:56  Lab Results  Component Value Date   WBC 4.6 11/12/2022   HGB 12.3 (L) 11/12/2022   HCT 37.1 (L) 11/12/2022   MCV 98.1 11/12/2022   PLT 110 (L) 11/12/2022   Last metabolic panel Lab Results  Component Value Date   GLUCOSE 131 (H) 11/12/2022   NA 134 (L) 11/12/2022   K 4.7 11/12/2022   CL 98 11/12/2022   CO2 27 11/12/2022   BUN 87 (H) 11/12/2022   CREATININE 3.93 (H) 11/12/2022   GFRNONAA 14 (L) 11/12/2022   CALCIUM 8.6 (L) 11/12/2022   PHOS 4.3 02/14/2021   PROT 6.8 09/21/2022   ALBUMIN 4.2 09/21/2022   LABGLOB 2.9 09/16/2022   AGRATIO 1.4 09/16/2022   BILITOT 1.2 09/21/2022  ALKPHOS 340 (H) 09/21/2022   AST 293 (H) 09/21/2022   ALT 252 (H) 09/21/2022   ANIONGAP 9 11/12/2022    Assessment and Plan: * Hip fracture (HCC) Mechanical fall with Comminuted intertrochanteric fracture of the right femoral neck with varus angulation on imaging.  Case reviewed by Dr. Martha Clan  with orthopedic surgery Plan for operative repair Follow-up formal orthopedic recommendations PT OT evaluation ?  Rehab versus placement Follow  Fall Mechanical fall at home w/ secondary R hip fracture  No head trauma or LOC  Pending operative repair w/ Dr. Rico Ala  PT/OT eval  Follow    Transaminitis AST of 293 and ALT of 252 Appears to have been evaluated outpatient for this issue May 2024 right upper quadrant ultrasound negative for any acute findings Recent hepatic serologies within normal limits Will check Tylenol level ?  Element of hepatic congestion in setting of HFrEF and ESRD Monitor for now Consider formal GI consult as clinically indicated  HFrEF (heart failure with reduced ejection fraction) (HCC) 2d ECHO 12/2017 w/ EF 45% Looks relatively euvolemic on presentation  Follow up formal recommendations by Dr. Welton Flakes   Essential hypertension BP stable  Titrate home regimen    Atrial fibrillation (HCC) Rate controlled present Cont home regimen  Pending formal cardiology consult pre-operatively   ESRD (end stage renal disease) (HCC) Baseline ESRD on HD MWF Dr. Cherylann Ratel consutled  Tentative plan for HD in am    GERD (gastroesophageal reflux disease) PPI   CAD (coronary artery disease) S/p 5 vessel CABG 2001  No active CP  EKG stable  Follow up formal cardiology recommendations     HLD (hyperlipidemia) On crestor  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) SSI  A1C      Advance Care Planning:   Code Status: DNR   Consults: Dr. Shela Nevin w/ ortho, Dr. Cherylann Ratel w/ nephrology, Dr. Welton Flakes w/ cardiology   Family Communication: Daughter at the bedside   Greater than 50% was spent in counseling and coordination of care with patient Total encounter time 80 minutes or more]  Severity of Illness: The appropriate patient status for this patient is INPATIENT. Inpatient status is judged to be reasonable and necessary in order to provide the  required intensity of service to ensure the patient's safety. The patient's presenting symptoms, physical exam findings, and initial radiographic and laboratory data in the context of their chronic comorbidities is felt to place them at high risk for further clinical deterioration. Furthermore, it is not anticipated that the patient will be medically stable for discharge from the hospital within 2 midnights of admission.   * I certify that at the point of admission it is my clinical judgment that the patient will require inpatient hospital care spanning beyond 2 midnights from the point of admission due to high intensity of service, high risk for further deterioration and high frequency of surveillance required.*  Author: Floydene Flock, MD 11/12/2022 8:18 AM  For on call review [email or web address].

## 2022-11-12 NOTE — ED Triage Notes (Signed)
BIB EMS/ pt was sitting on side of bed and slipped off onto hardwood floor/ obvious shortening and rotation to right leg/ pt c/o right hip pain/ on plavix, denies hitting head/ dialysis pt, left arm fistula/ pt is A&Ox4

## 2022-11-12 NOTE — Assessment & Plan Note (Addendum)
Mechanical fall with Comminuted intertrochanteric fracture of the right femoral neck with varus angulation on imaging.  Case reviewed by Dr. Martha Clan with orthopedic surgery Plan for operative repair Follow-up formal orthopedic recommendations PT OT evaluation ?  Rehab versus placement Follow

## 2022-11-12 NOTE — Assessment & Plan Note (Signed)
BP stable Titrate home regimen 

## 2022-11-12 NOTE — Assessment & Plan Note (Signed)
On crestor.   

## 2022-11-12 NOTE — ED Notes (Signed)
Report given to SDS RN.  

## 2022-11-12 NOTE — Consult Note (Signed)
ORTHOPAEDIC CONSULTATION  REQUESTING PHYSICIAN: Andris Baumann, MD  Chief Complaint: Right intertrochanteric hip fracture  HPI: Matthew [name] is a 89 y.o. male who was admitted overnight after falling out of bed during his right hip.  X-ray films in the [facility] emergency department revealed an intertrochanteric hip fracture.  Orthopedics is consulted for management of fracture.  Patient takes Plavix at baseline for atrial fibrillation.  Patient has a history of aortic aneurysm, status post CABG, cardiomyopathy and end-stage renal disease requiring hemodialysis.  Patient is seen in the emergency department this morning with his daughter at the bedside.  Patient is currently not having significant right hip pain at rest.  Patient is DNR.  Past Medical History:  Diagnosis Date   Anginal pain (HCC)    Arthritis    Ascending aortic aneurysm (HCC) 07/18/2015   a.) TTE 07/18/2015: severe dilation of ascending aorta measuring 7.0 cm. b.) TTE 01/10/2018: aortic root 3.8 cm; ascending aorta 6.8 cm; refused surgical intervention/repair.   Atrial fibrillation (HCC)    a.) CHA2DS2-VASc = 6 (age x 2, HFrEF, HTN, previous MI, T2DM). b.) rate/rhythm maintained on amiodarone + metoprolol succinate; chronic antiplatelet therapy using clopidogrel   Bradycardia    Broken arm 05/2013   Left   Cardiac murmur    a.) RIGHT upper sternal border   Cardiomyopathy [facility])    Coronary artery disease    DOE (dyspnea on exertion)    ESRD (end stage renal disease) (HCC)    GERD (gastroesophageal reflux disease)    HFrEF (heart failure with reduced ejection fraction) (HCC)    a.) TTE 12/03/2013: mod dec LV function; EF 35-40%; severe inferolateral and inferior HK; LA dilated; G3DD; PASP 52 mmHg. b.) TTE 07/18/2015: mild LV dysfunction with mild LVH; EF 45%; mild BAE. c.) TTE 01/10/2018: mod LV dysfunction; EF 45%; diffuse HK   HLD (hyperlipidemia)    Hypertension    IDA (iron deficiency anemia)    Long  term current use of antithrombotics/antiplatelets    a.) clopidogrel   Myocardial infarction (HCC) 2000   Peripheral edema    Pneumonia    S/P CABG x 4 2001   a.) LIMA-LAD, SVG-D1, SVG-OM1, SVG-OM2, SVG-PDA   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   left frontal scalp, EDC   Squamous cell carcinoma of skin 05/05/2020   left temporal scalp, in situ, EDC 05/13/20   T2DM (type 2 diabetes mellitus) (HCC)    Valvular insufficiency 12/03/2013   a.) TTE 12/03/2013: EF 35-40%; mild AR/MR, b.) TTE 07/18/2015: EF 45%; mild AR/PR, mod TR, severe MR. c.) TTE 01/10/2018: mod AR/TR   Past Surgical History:  Procedure Laterality Date   A/V SHUNTOGRAM Left 04/01/2022   Procedure: A/V SHUNTOGRAM;  Surgeon: Annice Needy, MD;  Location: [facility] INVASIVE CV LAB;  Service: Cardiovascular;  Laterality: Left;   AV FISTULA PLACEMENT Left 04/15/2021   Procedure: INSERTION OF ARTERIOVENOUS (AV) GORE-TEX GRAFT ARM ( BRACHIAL AXILLARY);  Surgeon: Annice Needy, MD;  Location: [facility] ORS;  Service: Vascular;  Laterality: Left;   CATARACT EXTRACTION W/PHACO Left 07/26/2017   Procedure: CATARACT EXTRACTION PHACO AND INTRAOCULAR LENS PLACEMENT (IOC);  Surgeon: Galen Manila, MD;  Location: [facility] ORS;  Service: Ophthalmology;  Laterality: Left;  [place]   00:45.8 AP%  13.1 CDE  6.00 Fluid Pack Lot # [id number]   CATARACT EXTRACTION W/PHACO Right 08/17/2017   Procedure: CATARACT EXTRACTION PHACO AND INTRAOCULAR LENS PLACEMENT (IOC);  Surgeon: Galen Manila, MD;  Location: [facility] ORS;  Service:  Ophthalmology;  Laterality: Right;  [place]  01:30 AP% 16.8 CDE 15.24 Fluid pack lot # [id number]   CHOLECYSTECTOMY     CORONARY ARTERY BYPASS GRAFT N/A 2001   Procedure: 5v CABG (LIMA-LAD, SVG-D1, SVG-OM1, SVG-OM2, SVG-PDA)   DIALYSIS/PERMA CATHETER INSERTION N/A 02/09/2018   Procedure: DIALYSIS/PERMA CATHETER INSERTION;  Surgeon: Annice Needy, MD;  Location: [facility] INVASIVE CV LAB;  Service: Cardiovascular;  Laterality: N/A;    DIALYSIS/PERMA CATHETER REMOVAL N/A 06/25/2021   Procedure: DIALYSIS/PERMA CATHETER REMOVAL;  Surgeon: Annice Needy, MD;  Location: [facility] INVASIVE CV LAB;  Service: Cardiovascular;  Laterality: N/A;   FRACTURE SURGERY Left 2015   LEFT arm   INSERTION OF DIALYSIS CATHETER Right 11/03/2020   Procedure: INSERTION OF Perm Cath in the RIGHT INTERNAL JUGULAR;  Surgeon: Annice Needy, MD;  Location: [facility] ORS;  Service: Vascular;  Laterality: Right;   LEFT HEART CATHETERIZATION WITH CORONARY/GRAFT ANGIOGRAM Left 12/04/2013   Procedure: LEFT HEART CATHETERIZATION WITH Isabel Caprice;  Surgeon: Marykay Lex, MD;  Location: [facility] CATH LAB;  Service: Cardiovascular;  Laterality: Left;   LEFT HEART CATHETERIZATION WITH CORONARY/GRAFT ANGIOGRAM Left 02/06/2009   Procedure: LEFT HEART CATHETERIZATION WITH CORONARY/GRAFT ANGIOGRAM; Location: [facility]; Surgeon: Despina Hick, MD   PERCUTANEOUS CORONARY STENT INTERVENTION (PCI-S) N/A 12/06/2013   Procedure: STAGED PERCUTANEOUS CORONARY STENT INTERVENTION (overlapping 4.0 x 38 mm (mid) and 4.0 x 28 mm (ostial) Promus Primier DES to SVG-RPDA graft);  Surgeon: Lesleigh Noe, MD;  Location: [facility] CATH LAB;  Service: Cardiovascular   REMOVAL OF A DIALYSIS CATHETER N/A 11/03/2020   Procedure: REMOVAL OF A DIALYSIS CATHETER;  Surgeon: Annice Needy, MD;  Location: [facility] ORS;  Service: Vascular;  Laterality: N/A;   Social History   Socioeconomic History   Marital status: Widowed    Spouse name: Not on file   Number of children: Not on file   Years of education: Not on file   Highest education level: Not on file  Occupational History   Not on file  Tobacco Use   Smoking status: Former    Types: Pipe, Cigarettes    Quit date: 74    Years since quitting: 32.4   Smokeless tobacco: Never  Vaping Use   Vaping Use: Never used  Substance and Sexual Activity   Alcohol use: Not Currently    Alcohol/week: 1.0 standard drink of alcohol    Types: 1 Cans of beer per  week    Comment: occasionally drinking only,once a month   Drug use: No   Sexual activity: Never  Other Topics Concern   Not on file  Social History Narrative   Lives with daughter and son in law   Social Determinants of Health   Financial Resource Strain: Low Risk  (01/10/2018)   Overall Financial Resource Strain (CARDIA)    Difficulty of Paying Living Expenses: Not hard at all  Food Insecurity: No Food Insecurity (11/12/2022)   Hunger Vital Sign    Worried About Running Out of Food in the Last Year: Never true    Ran Out of Food in the Last Year: Never true  Transportation Needs: No Transportation Needs (11/12/2022)   PRAPARE - Administrator, Civil Service (Medical): No    Lack of Transportation (Non-Medical): No  Physical Activity: Inactive (01/10/2018)   Exercise Vital Sign    Days of Exercise per Week: 0 days    Minutes of Exercise per Session: 0 min  Stress: No Stress Concern Present (01/10/2018)  [employer] of Occupational Health - Occupational Stress Questionnaire    Feeling of Stress : Not at all  Social Connections: Socially Integrated (01/10/2018)   Social Connection and Isolation Panel [NHANES]    Frequency of Communication with Friends and Family: More than three times a week    Frequency of Social Gatherings with Friends and Family: More than three times a week    Attends Religious Services: More than 4 times per year    Active Member of [employer] or Organizations: Yes    Attends Engineer, structural: More than 4 times per year    Marital Status: Married   Family History  Problem Relation Age of Onset   Heart attack Father 71       died first MI at age 29   Heart failure Mother    Allergies  Allergen Reactions   Other     Other reaction(s): Myalgias (intolerance), Other (See Comments) Statin Drugs  Statin Drugs     Amoxicillin Diarrhea   Prior to Admission medications   Medication Sig Start Date End Date Taking? Authorizing  Provider  acyclovir ointment (ZOVIRAX) 5 % Apply 1 application topically daily as needed (shingles).    [provider]  bisacodyl (DULCOLAX) 10 MG suppository Place 1 suppository (10 mg total) rectally daily as needed for moderate constipation. 01/05/21   Enedina Finner, MD  cholecalciferol (VITAMIN D) 25 MCG (1000 UNIT) tablet Take 1,000 Units by mouth daily with breakfast.    [provider]  clopidogrel (PLAVIX) 75 MG tablet TAKE 1 TABLET BY MOUTH ONCE  DAILY 07/13/22   Laurier Nancy, MD  ferrous sulfate 325 (65 FE) MG tablet Take 325 mg by mouth 2 (two) times daily with a meal.     [provider]  fluticasone (FLONASE) 50 MCG/ACT nasal spray Place 2 sprays into both nostrils daily as needed for allergies or rhinitis.    [provider]  furosemide (LASIX) 40 MG tablet Take 40 mg by mouth.    [provider]  gabapentin (NEURONTIN) 300 MG capsule Take 1 capsule (300 mg total) by mouth at bedtime. 02/17/21   Delfino Lovett, MD  glipiZIDE (GLUCOTROL) 5 MG tablet Take 2.5 mg by mouth daily at 12 noon. 01/13/21   [provider]  levocetirizine (XYZAL) 5 MG tablet Take 10 mg by mouth every morning.     [provider]  levothyroxine (SYNTHROID) 100 MCG tablet TAKE 1 TABLET BY MOUTH IN THE  MORNING 08/04/22   Sherron Monday, MD  levothyroxine (SYNTHROID) 75 MCG tablet Take 1 tablet (75 mcg total) by mouth daily before breakfast. 02/18/21   Delfino Lovett, MD  metoprolol succinate (TOPROL-XL) 50 MG 24 hr tablet Take 50 mg by mouth daily.    [provider]  midodrine (PROAMATINE) 5 MG tablet Take 1 tablet (5 mg total) by mouth 3 (three) times daily with meals. Patient taking differently: Take 5 mg by mouth 3 (three) times daily as needed. 02/17/21   Delfino Lovett, MD  multivitamin (RENA-VIT) TABS tablet Take 1 tablet by mouth daily.    [provider]  nitroGLYCERIN (NITROSTAT) 0.4 MG SL tablet Place 0.4 mg under the tongue every 5  (five) minutes as needed for chest pain.    [provider]  Omega-3 Fatty Acids (FISH OIL) 1000 MG CAPS Take 4,000 mg by mouth daily with lunch.    [provider]  polyethylene glycol (MIRALAX / GLYCOLAX) 17 g packet Take 17 g  by mouth daily. 01/06/21   Enedina Finner, MD  potassium chloride SA (KLOR-CON) 20 MEQ tablet Take 20 mEq by mouth daily.    [provider]  QUEtiapine (SEROQUEL) 25 MG tablet TAKE 1 TABLET BY MOUTH AT  BEDTIME 08/16/22   Sherron Monday, MD  rosuvastatin (CRESTOR) 40 MG tablet TAKE 1 TABLET BY MOUTH ONCE  DAILY 10/28/22   Laurier Nancy, MD  senna-docusate (SENOKOT-S) 8.6-50 MG tablet Take 2 tablets by mouth 2 (two) times daily. 01/05/21   Enedina Finner, MD  sotalol (BETAPACE) 80 MG tablet Take 1 tablet (80 mg total) by mouth 2 (two) times daily. 10/14/22   Laurier Nancy, MD  Suvorexant (BELSOMRA) 10 MG TABS Take 10 mg by mouth at bedtime.    [provider]  traMADol (ULTRAM) 50 MG tablet Take 1 tablet (50 mg total) by mouth every 6 (six) hours as needed for moderate pain. 02/17/21   Delfino Lovett, MD   DG Chest Port 1 View  Result Date: 11/12/2022 CLINICAL DATA:  Right femoral neck fracture. EXAM: PORTABLE CHEST 1 VIEW COMPARISON:  02/11/2021 FINDINGS: The cardio pericardial silhouette is enlarged. Status post CABG. Mild vascular congestion without edema. No focal consolidation or pleural effusion. Vascular stent device noted medial left arm. Bones are diffusely demineralized. IMPRESSION: Enlargement of the cardiopericardial silhouette with mild vascular congestion. Electronically Signed   By: Kennith Center M.D.   On: 11/12/2022 04:59   DG Hip Unilat W or Wo Pelvis 2-3 Views Right  Result Date: 11/12/2022 CLINICAL DATA:  Fall.  Right hip pain. EXAM: DG HIP (WITH OR WITHOUT PELVIS) 2-3V RIGHT COMPARISON:  None Available. FINDINGS: Diffuse osteopenia. SI joints and symphysis pubis unremarkable. Deformity of the right superior inferior pubic rami  compatible with healed fractures. Acute comminuted intertrochanteric fracture of the right femoral neck evident with varus angulation. IMPRESSION: Acute comminuted intertrochanteric fracture of the right femoral neck with varus angulation. Electronically Signed   By: Kennith Center M.D.   On: 11/12/2022 04:58   DG Femur Min 2 Views Right  Result Date: 11/12/2022 CLINICAL DATA:  Fall. EXAM: RIGHT FEMUR 2 VIEWS COMPARISON:  None Available. FINDINGS: Two view show study shows diffuse osteopenia. Comminuted intertrochanteric fracture of the femoral neck evident with varus angulation. Deformity in the right superior inferior pubic rami suggest remote trauma. Degenerative changes are evident at the knee. IMPRESSION: Comminuted intertrochanteric fracture of the right femoral neck with varus angulation. Electronically Signed   By: Kennith Center M.D.   On: 11/12/2022 04:56    Positive ROS: All other systems have been reviewed and were otherwise negative with the exception of those mentioned in the HPI and as above.  Physical Exam: General: Alert, no acute distress  MUSCULOSKELETAL: Right lower extremity: Foot and externally rotated.  Skin is intact overlying the right hip.  Compartments are soft in the right thigh and lower leg.  Patient is intact sensation light touch and intact motor function distally.  He has palpable pedal pulses.  Assessment: Right intertrochanteric hip fracture  Plan: I explained to the patient and his daughter the nature of his fracture.  I have recommended intramedullary fixation for this fracture. Patient is on the OR schedule for approximately 1 PM today pending medical clearance.  I spoke with Dr. Lourdes Sledge his nephrologist who will schedule his hemodialysis for tomorrow morning so that surgery can be done today.  Cardiology consultation has been ordered by the hospitalist and is pending.  I discussed the details of  the operation as well as the postoperative course with the patient  and his daughter. I discussed the risks and benefits of surgery. The risks include but are not limited to infection, bleeding requiring blood transfusion, nerve or blood vessel injury, joint stiffness or loss of motion, persistent pain, weakness or instability, malunion, nonunion and hardware failure and the need for further surgery. Medical risks include but are not limited to DVT and pulmonary embolism, myocardial infarction, stroke, pneumonia, respiratory failure and death. Patient and his daughter understood these risks and wished to proceed.    Juanell Fairly, MD    11/12/2022 8:25 AM

## 2022-11-12 NOTE — Op Note (Signed)
11/12/2022  3:10 PM  PATIENT:  Matthew [name]    PRE-OPERATIVE DIAGNOSIS:  Right intertrochanteric hip fracture  POST-OPERATIVE DIAGNOSIS:  Same  PROCEDURE: Intramedullary fixation for right intertrochanteric hip fracture   SURGEON:  Juanell Fairly, MD  ANESTHESIA:   General  EBL:  50cc  IMPLANT:  ZIMMER BIOMET AFFIXUS NAIL 11mm x 420 mm with a 115 mm lag screw and distal interlocking screws 46 mm  and 58 mm in length.  PREOPERATIVE INDICATIONS:  KHAIRI GARMAN is a  89 y.o. male with a diagnosis of Right intertrochanteric hip fracture was recommended for intramedullary fixation of his fracture.    The risks, benefits and alternatives were discussed with the patient and their family.  The risks include but are not limited to infection, bleeding requiring blood transfusion, nerve or blood vessel injury, malunion, nonunion, hardware prominence, hardware failure, leg length discrepancy or change in lower extremity rotation and need for further surgery including hardware removal with conversion to a total hip arthroplasty. Medical risks include but are not limited to DVT and pulmonary embolism, myocardial infarction, stroke, pneumonia, respiratory failure and death. The patient and their daughter understood these risks and wished to proceed with surgery.  OPERATIVE PROCEDURE:  The patient was brought to the operating room and placed in the supine position on the fracture table. The patient received general anesthesia.  A closed reduction was performed under C-arm guidance.  The fracture reduction was confirmed on both AP and lateral views. After adequate reduction was achieved, a time out was performed to verify the patient's name, date of birth, medical record number, correct site of surgery correct procedure to be performed. The timeout was also used to verify the patient received antibiotics and all appropriate instruments, implants and radiographic studies were available in the room.  Once all in attendance were in agreement, the case began. The patient was prepped and draped in a sterile fashion.  The received preoperative antibiotics with 2 g of Ancef IV.  An incision was made proximal to the greater trochanter in line with the femur. A guidewire was placed over the tip of the greater trochanter and advanced by drill into the proximal femur to the level of the lesser trochanter.  Confirmation of the drill pin position was made on AP and lateral C-arm images.  The threaded guidepin was then overdrilled with the proximal femoral entry reamer.  A ball-tipped guidewire was then advanced down the intramedullary canal, across the fracture, and down the femoral shaft to the knee.  The ball tip guidewire's position was confirmed at both the knee and hip via C-arm imaging. A depth gauge was used to measure the length of the long nail to be used. It was measured to be 420 mm. The actual nail was then inserted into the proximal femur, across the fracture site and down the femoral shaft. Its position was confirmed on AP and lateral C-arm images.  The ball tip guidewire was removed.  Once the nail was completely seated, the drill guide for the lag screw was placed through the guide arm for the Affixus nail. A guidepin was then placed through this drill guide and advanced through the lateral cortex of the femur, across the fracture site and into the femoral head achieving a tip apex distance of less than 25 mm. The length of the drill pin was measured to be 150 mm, and then the drill for the lag screw was advanced through the lateral cortex, across the fracture site and  up into the femoral head to a depth of 115 mm.  The lag screw was then advanced by hand into position across the fracture site into the femoral head. Its final position was confirmed on AP and lateral C-arm images. Compression was applied as traction was carefully released. The set screw in the top of the intramedullary rod was tightened  by hand using a screwdriver. It was backed off a quarter turn to allow for compression at the fracture site.  The attention was then turned to placement of the distal interlocking screws. A perfect circle technique was used. 2 small stab incisions were made over the distal interlocking screw holes.  A free hand technique was used to drill both distal interlocking screws. The depth of the screw holes was measured with a depth gauge. The 46 mm and 58 mm screws were then advanced into position and tightened by hand. Final C-arm images of the entire intramedullary construct were taken in both the AP and lateral planes.   The wounds were irrigated copiously and closed with 0 Vicryl for closure of the deep fascia and 2-0 Vicryl for subcutaneous closure. The skin was approximated with staples.  0.25% Marcaine with epi was injected x 30 cc into the subcutaneous tissue to assist with postoperative pain control. A dry sterile dressing was applied. I was scrubbed and present the entire case and all sharp, sponge and instrument counts were correct at the conclusion of the case. Patient was transferred to a hospital bed and brought to PACU in stable condition.     Kathreen Devoid, MD

## 2022-11-12 NOTE — Progress Notes (Signed)
Subjective:  POST OP CHECK s/p intramedullary fixation for right intertrochanteric hip fracture..   Patient reports right hip pain as mild (3/10).  Patient is alert and appropriate.  His nursing staff is in the room along with family members.  Objective:   VITALS:   Vitals:   11/12/22 1500 11/12/22 1515 11/12/22 1530 11/12/22 1545  BP: (!) 142/50 (!) 136/47 (!) 110/48 (!) 102/41  Pulse: 60 62 61 (!) 59  Resp: 18 20 18 13   Temp:    98.1 F (36.7 C)  TempSrc:      SpO2: 100% 100% 97% 95%  Weight:      Height:        PHYSICAL EXAM: Right lower extremity Neurovascular intact Sensation intact distally Intact pulses distally Dorsiflexion/Plantar flexion intact Incision: dressing C/D/I No cellulitis present Compartment soft  LABS  Results for orders placed or performed during the hospital encounter of 11/12/22 (from the past 24 hour(s))  Basic metabolic panel     Status: Abnormal   Collection Time: 11/12/22  4:04 AM  Result Value Ref Range   Sodium 134 (L) 135 - 145 mmol/L   Potassium 4.7 3.5 - 5.1 mmol/L   Chloride 98 98 - 111 mmol/L   CO2 27 22 - 32 mmol/L   Glucose, Bld 131 (H) 70 - 99 mg/dL   BUN 87 (H) 8 - 23 mg/dL   Creatinine, Ser 1.61 (H) 0.61 - 1.24 mg/dL   Calcium 8.6 (L) 8.9 - 10.3 mg/dL   GFR, Estimated 14 (L) >60 mL/min   Anion gap 9 5 - 15  CBC with Differential     Status: Abnormal   Collection Time: 11/12/22  4:04 AM  Result Value Ref Range   WBC 4.6 4.0 - 10.5 K/uL   RBC 3.78 (L) 4.22 - 5.81 MIL/uL   Hemoglobin 12.3 (L) 13.0 - 17.0 g/dL   HCT 09.6 (L) 04.5 - 40.9 %   MCV 98.1 80.0 - 100.0 fL   MCH 32.5 26.0 - 34.0 pg   MCHC 33.2 30.0 - 36.0 g/dL   RDW 81.1 91.4 - 78.2 %   Platelets 110 (L) 150 - 400 K/uL   nRBC 0.0 0.0 - 0.2 %   Neutrophils Relative % 55 %   Neutro Abs 2.6 1.7 - 7.7 K/uL   Lymphocytes Relative 15 %   Lymphs Abs 0.7 0.7 - 4.0 K/uL   Monocytes Relative 17 %   Monocytes Absolute 0.8 0.1 - 1.0 K/uL   Eosinophils Relative 11 %    Eosinophils Absolute 0.5 0.0 - 0.5 K/uL   Basophils Relative 1 %   Basophils Absolute 0.0 0.0 - 0.1 K/uL   Immature Granulocytes 1 %   Abs Immature Granulocytes 0.03 0.00 - 0.07 K/uL  Protime-INR     Status: None   Collection Time: 11/12/22  6:18 AM  Result Value Ref Range   Prothrombin Time 14.2 11.4 - 15.2 seconds   INR 1.1 0.8 - 1.2  APTT     Status: None   Collection Time: 11/12/22  6:18 AM  Result Value Ref Range   aPTT 26 24 - 36 seconds  Hepatitis B surface antigen     Status: None   Collection Time: 11/12/22  9:03 AM  Result Value Ref Range   Hepatitis B Surface Ag NON REACTIVE NON REACTIVE  Acetaminophen level     Status: Abnormal   Collection Time: 11/12/22  9:03 AM  Result Value Ref Range   Acetaminophen (Tylenol), Serum <10 (  L) 10 - 30 ug/mL  CBG monitoring, ED     Status: Abnormal   Collection Time: 11/12/22 11:48 AM  Result Value Ref Range   Glucose-Capillary 105 (H) 70 - 99 mg/dL  CBG monitoring, ED     Status: Abnormal   Collection Time: 11/12/22  2:56 PM  Result Value Ref Range   Glucose-Capillary 177 (H) 70 - 99 mg/dL    DG FEMUR, MIN 2 VIEWS RIGHT  Result Date: 11/12/2022 CLINICAL DATA:  Status post intramedullary rod fixation of right femur fracture. EXAM: RIGHT FEMUR 2 VIEWS COMPARISON:  November 12, 2022. FINDINGS: Status post intramedullary rod fixation of intertrochanteric fracture involving proximal right femur. Good alignment of fracture components is noted. Expected postsurgical changes are seen in the surrounding soft tissues. IMPRESSION: Status post intramedullary rod fixation of proximal right femoral intertrochanteric fracture. Electronically Signed   By: Lupita Raider M.D.   On: 11/12/2022 15:36   DG HIP UNILAT WITH PELVIS 2-3 VIEWS RIGHT  Result Date: 11/12/2022 CLINICAL DATA:  Surgery, elective EXAM: DG HIP (WITH OR WITHOUT PELVIS) 2-3V RIGHT COMPARISON:  None Available. FINDINGS: Fluoro time: 1 minute and 30 seconds. Four C-arm fluoroscopic  images were obtained intraoperatively and submitted for post operative interpretation. These images demonstrate surgical changes associated with intramedullary nail and screw fixation of the femur. Please see the performing provider's procedural report for further detail. IMPRESSION: Intraoperative fluoroscopic imaging, as detailed above. Electronically Signed   By: Feliberto Harts M.D.   On: 11/12/2022 14:35   DG C-Arm 1-60 Min-No Report  Result Date: 11/12/2022 Fluoroscopy was utilized by the requesting physician.  No radiographic interpretation.   DG Chest Port 1 View  Result Date: 11/12/2022 CLINICAL DATA:  Right femoral neck fracture. EXAM: PORTABLE CHEST 1 VIEW COMPARISON:  02/11/2021 FINDINGS: The cardio pericardial silhouette is enlarged. Status post CABG. Mild vascular congestion without edema. No focal consolidation or pleural effusion. Vascular stent device noted medial left arm. Bones are diffusely demineralized. IMPRESSION: Enlargement of the cardiopericardial silhouette with mild vascular congestion. Electronically Signed   By: Kennith Center M.D.   On: 11/12/2022 04:59   DG Hip Unilat W or Wo Pelvis 2-3 Views Right  Result Date: 11/12/2022 CLINICAL DATA:  Fall.  Right hip pain. EXAM: DG HIP (WITH OR WITHOUT PELVIS) 2-3V RIGHT COMPARISON:  None Available. FINDINGS: Diffuse osteopenia. SI joints and symphysis pubis unremarkable. Deformity of the right superior inferior pubic rami compatible with healed fractures. Acute comminuted intertrochanteric fracture of the right femoral neck evident with varus angulation. IMPRESSION: Acute comminuted intertrochanteric fracture of the right femoral neck with varus angulation. Electronically Signed   By: Kennith Center M.D.   On: 11/12/2022 04:58   DG Femur Min 2 Views Right  Result Date: 11/12/2022 CLINICAL DATA:  Fall. EXAM: RIGHT FEMUR 2 VIEWS COMPARISON:  None Available. FINDINGS: Two view show study shows diffuse osteopenia. Comminuted  intertrochanteric fracture of the femoral neck evident with varus angulation. Deformity in the right superior inferior pubic rami suggest remote trauma. Degenerative changes are evident at the knee. IMPRESSION: Comminuted intertrochanteric fracture of the right femoral neck with varus angulation. Electronically Signed   By: Kennith Center M.D.   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  Patient will receive hemodialysis tomorrow per Dr. Cherylann Ratel.  I reviewed the postoperative x-rays which demonstrate the fracture is well aligned and the intramedullary hardware is well-positioned.  No evidence of postop complication.  Patient will have labs rechecked in the morning.  He will begin physical therapy tomorrow.  Patient will restart his Plavix tomorrow the addition of aspirin for DVT prophylaxis.  He may weight-bear as tolerated in the right lower extremity.    Juanell Fairly , MD 11/12/2022, 4:12 PM

## 2022-11-13 DIAGNOSIS — I9589 Other hypotension: Secondary | ICD-10-CM

## 2022-11-13 DIAGNOSIS — N186 End stage renal disease: Secondary | ICD-10-CM | POA: Diagnosis not present

## 2022-11-13 DIAGNOSIS — R7401 Elevation of levels of liver transaminase levels: Secondary | ICD-10-CM

## 2022-11-13 DIAGNOSIS — I480 Paroxysmal atrial fibrillation: Secondary | ICD-10-CM

## 2022-11-13 DIAGNOSIS — I120 Hypertensive chronic kidney disease with stage 5 chronic kidney disease or end stage renal disease: Secondary | ICD-10-CM

## 2022-11-13 DIAGNOSIS — E1122 Type 2 diabetes mellitus with diabetic chronic kidney disease: Secondary | ICD-10-CM

## 2022-11-13 DIAGNOSIS — I5020 Unspecified systolic (congestive) heart failure: Secondary | ICD-10-CM

## 2022-11-13 DIAGNOSIS — S72001S Fracture of unspecified part of neck of right femur, sequela: Secondary | ICD-10-CM | POA: Diagnosis not present

## 2022-11-13 LAB — COMPREHENSIVE METABOLIC PANEL
ALT: 30 U/L (ref 0–44)
AST: 35 U/L (ref 15–41)
Albumin: 2.4 g/dL — ABNORMAL LOW (ref 3.5–5.0)
Alkaline Phosphatase: 103 U/L (ref 38–126)
Anion gap: 10 (ref 5–15)
BUN: 85 mg/dL — ABNORMAL HIGH (ref 8–23)
CO2: 18 mmol/L — ABNORMAL LOW (ref 22–32)
Calcium: 7.2 mg/dL — ABNORMAL LOW (ref 8.9–10.3)
Chloride: 107 mmol/L (ref 98–111)
Creatinine, Ser: 3.72 mg/dL — ABNORMAL HIGH (ref 0.61–1.24)
GFR, Estimated: 15 mL/min — ABNORMAL LOW (ref >60)
Glucose, Bld: 155 mg/dL — ABNORMAL HIGH (ref 70–99)
Potassium: 4.9 mmol/L (ref 3.5–5.1)
Sodium: 135 mmol/L (ref 135–145)
Total Bilirubin: 0.6 mg/dL (ref 0.3–1.2)
Total Protein: 5.2 g/dL — ABNORMAL LOW (ref 6.5–8.1)

## 2022-11-13 LAB — CBC
HCT: 33.1 % — ABNORMAL LOW (ref 39.0–52.0)
Hemoglobin: 10.9 g/dL — ABNORMAL LOW (ref 13.0–17.0)
MCH: 32.4 pg (ref 26.0–34.0)
MCHC: 32.9 g/dL (ref 30.0–36.0)
MCV: 98.5 fL (ref 80.0–100.0)
Platelets: 103 10*3/uL — ABNORMAL LOW (ref 150–400)
RBC: 3.36 MIL/uL — ABNORMAL LOW (ref 4.22–5.81)
RDW: 14.3 % (ref 11.5–15.5)
WBC: 9.7 10*3/uL (ref 4.0–10.5)
nRBC: 0 % (ref 0.0–0.2)

## 2022-11-13 LAB — GLUCOSE, CAPILLARY
Glucose-Capillary: 158 mg/dL — ABNORMAL HIGH (ref 70–99)
Glucose-Capillary: 179 mg/dL — ABNORMAL HIGH (ref 70–99)
Glucose-Capillary: 150 mg/dL — ABNORMAL HIGH (ref 70–99)

## 2022-11-13 LAB — HEPATITIS B SURFACE ANTIBODY, QUANTITATIVE: Hep B S AB Quant (Post): 36.6 m[IU]/mL (ref >9.9)

## 2022-11-13 MED ORDER — MIDODRINE HCL 5 MG PO TABS
ORAL_TABLET | ORAL | Status: AC
  Filled 2022-11-13: qty 1

## 2022-11-13 MED ORDER — POLYETHYLENE GLYCOL 3350 17 G PO PACK
17.0000 g | PACK | Freq: Every day | ORAL | Status: DC
  Administered 2022-11-13 – 2022-11-14 (×2): 17 g via ORAL
  Filled 2022-11-13 (×2): qty 1

## 2022-11-13 MED ORDER — GLUCERNA SHAKE PO LIQD
237.0000 mL | Freq: Three times a day (TID) | ORAL | Status: DC
  Administered 2022-11-13 – 2022-11-16 (×9): 237 mL via ORAL

## 2022-11-13 NOTE — Progress Notes (Signed)
[facility]  ROUNDING NOTE   Subjective:   Matthew [name] is an 89 year old male with medical concerns including CAD status post CABG, hypothyroidism, hypertension, CHF, type 2 diabetes, and end-stage renal disease on hemodialysis. He presents to the ED after suffering a fall and rotation and shortened right leg. He has been admitted for Hip fracture [facility]) [S72.009A] End-stage renal disease on hemodialysis (HCC) [N18.6, Z99.2] Fall, initial encounter [W19.XXXA] Intertrochanteric fracture of right femur, closed, initial encounter (HCC) [S72.141A]  Daughter at bedside.   Patient scheduled for dialysis later today.   Objective:  Vital signs in last 24 hours:  Temp:  [97.5 F (36.4 C)-98.1 F (36.7 C)] 97.6 F (36.4 C) (06/22 0835) Pulse Rate:  [55-62] 58 (06/22 0835) Resp:  [13-20] 16 (06/22 0835) BP: (88-156)/(41-66) 102/62 (06/22 0835) SpO2:  [95 %-100 %] 99 % (06/22 0835)  Weight change: -0.034 kg Filed Weights   11/12/22 0338 11/12/22 1204  Weight: 92.5 kg 92.5 kg    Intake/Output: I/O last 3 completed shifts: In: 2503.6 [P.O.:480; I.V.:317.4; IV Piggyback:1706.2] Out: 250 [Urine:200; Blood:50]   Intake/Output this shift:  Total I/O In: 480 [P.O.:480] Out: -   Physical Exam: General: NAD  Head: Normocephalic, atraumatic. Moist oral mucosal membranes  Eyes: Anicteric  Lungs:  Clear to auscultation  Heart: Regular rate and rhythm  Abdomen:  Soft, nontender  Extremities:  No peripheral edema, right hip with clean dressings.    Neurologic: Nonfocal, moving all four extremities  Skin: No lesions  Access: Left AVF    Basic Metabolic Panel: Recent Labs  Lab 11/12/22 0404 11/13/22 0522  NA 134* 135  K 4.7 4.9  CL 98 107  CO2 27 18*  GLUCOSE 131* 155*  BUN 87* 85*  CREATININE 3.93* 3.72*  CALCIUM 8.6* 7.2*     Liver Function Tests: Recent Labs  Lab 11/13/22 0522  AST 35  ALT 30  ALKPHOS 103  BILITOT 0.6  PROT 5.2*  ALBUMIN 2.4*    No results for input(s): "LIPASE", "AMYLASE" in the last 168 hours. No results for input(s): "AMMONIA" in the last 168 hours.  CBC: Recent Labs  Lab 11/12/22 0404 11/13/22 0656  WBC 4.6 9.7  NEUTROABS 2.6  --   HGB 12.3* 10.9*  HCT 37.1* 33.1*  MCV 98.1 98.5  PLT 110* 103*     Cardiac Enzymes: No results for input(s): "CKTOTAL", "CKMB", "CKMBINDEX", "TROPONINI" in the last 168 hours.  BNP: Invalid input(s): "POCBNP"  CBG: Recent Labs  Lab 11/12/22 1148 11/12/22 1456 11/12/22 1817 11/13/22 0825 11/13/22 1140  GLUCAP 105* 177* 169* 158* 179*     Microbiology: Results for orders placed or performed in visit on 10/15/22  Culture, Urine     Status: Abnormal   Collection Time: 10/15/22  2:05 PM   Specimen: Urine   UC  Result Value Ref Range Status   Urine Culture, Routine Final report (A)  Final   Organism ID, Bacteria Escherichia coli (A)  Final    Comment: Cefazolin <=4 ug/mL Cefazolin with an MIC <=16 predicts susceptibility to the oral agents cefaclor, cefdinir, cefpodoxime, cefprozil, cefuroxime, cephalexin, and loracarbef when used for therapy of uncomplicated urinary tract infections due to E. coli, Klebsiella pneumoniae, and Proteus mirabilis. Greater than 100,000 colony forming units per mL    Antimicrobial Susceptibility Comment  Final    Comment:       ** S = Susceptible; I = Intermediate; R = Resistant **  P = Positive; N = Negative             MICS are expressed in micrograms per mL    Antibiotic                 RSLT#1    RSLT#2    RSLT#3    RSLT#4 Amoxicillin/Clavulanic Acid    S Ampicillin                     S Cefepime                       S Ceftriaxone                    S Cefuroxime                     S Ciprofloxacin                  S Ertapenem                      S Gentamicin                     S Imipenem                       S Levofloxacin                   S Meropenem                      S Nitrofurantoin                  S Piperacillin/Tazobactam        S Tetracycline                   S Tobramycin                     S Trimethoprim/Sulfa             S     Coagulation Studies: Recent Labs    11/12/22 0618  LABPROT 14.2  INR 1.1     Urinalysis: No results for input(s): "COLORURINE", "LABSPEC", "PHURINE", "GLUCOSEU", "HGBUR", "BILIRUBINUR", "KETONESUR", "PROTEINUR", "UROBILINOGEN", "NITRITE", "LEUKOCYTESUR" in the last 72 hours.  Invalid input(s): "APPERANCEUR"    Imaging: DG FEMUR, MIN 2 VIEWS RIGHT  Result Date: 11/12/2022 CLINICAL DATA:  Status post intramedullary rod fixation of right femur fracture. EXAM: RIGHT FEMUR 2 VIEWS COMPARISON:  November 12, 2022. FINDINGS: Status post intramedullary rod fixation of intertrochanteric fracture involving proximal right femur. Good alignment of fracture components is noted. Expected postsurgical changes are seen in the surrounding soft tissues. IMPRESSION: Status post intramedullary rod fixation of proximal right femoral intertrochanteric fracture. Electronically Signed   By: Lupita Raider M.D.   On: 11/12/2022 15:36   DG HIP UNILAT WITH PELVIS 2-3 VIEWS RIGHT  Result Date: 11/12/2022 CLINICAL DATA:  Surgery, elective EXAM: DG HIP (WITH OR WITHOUT PELVIS) 2-3V RIGHT COMPARISON:  None Available. FINDINGS: Fluoro time: 1 minute and 30 seconds. Four C-arm fluoroscopic images were obtained intraoperatively and submitted for post operative interpretation. These images demonstrate surgical changes associated with intramedullary nail and screw fixation of the femur. Please see the performing provider's procedural report for further detail. IMPRESSION: Intraoperative fluoroscopic imaging, as detailed above. Electronically Signed   By: Gelene Mink  Barnett Applebaum M.D.   On: 11/12/2022 14:35   DG C-Arm 1-60 Min-No Report  Result Date: 11/12/2022 Fluoroscopy was utilized by the requesting physician.  No radiographic interpretation.   DG Chest Port 1 View  Result  Date: 11/12/2022 CLINICAL DATA:  Right femoral neck fracture. EXAM: PORTABLE CHEST 1 VIEW COMPARISON:  02/11/2021 FINDINGS: The cardio pericardial silhouette is enlarged. Status post CABG. Mild vascular congestion without edema. No focal consolidation or pleural effusion. Vascular stent device noted medial left arm. Bones are diffusely demineralized. IMPRESSION: Enlargement of the cardiopericardial silhouette with mild vascular congestion. Electronically Signed   By: Kennith Center M.D.   On: 11/12/2022 04:59   DG Hip Unilat W or Wo Pelvis 2-3 Views Right  Result Date: 11/12/2022 CLINICAL DATA:  Fall.  Right hip pain. EXAM: DG HIP (WITH OR WITHOUT PELVIS) 2-3V RIGHT COMPARISON:  None Available. FINDINGS: Diffuse osteopenia. SI joints and symphysis pubis unremarkable. Deformity of the right superior inferior pubic rami compatible with healed fractures. Acute comminuted intertrochanteric fracture of the right femoral neck evident with varus angulation. IMPRESSION: Acute comminuted intertrochanteric fracture of the right femoral neck with varus angulation. Electronically Signed   By: Kennith Center M.D.   On: 11/12/2022 04:58   DG Femur Min 2 Views Right  Result Date: 11/12/2022 CLINICAL DATA:  Fall. EXAM: RIGHT FEMUR 2 VIEWS COMPARISON:  None Available. FINDINGS: Two view show study shows diffuse osteopenia. Comminuted intertrochanteric fracture of the femoral neck evident with varus angulation. Deformity in the right superior inferior pubic rami suggest remote trauma. Degenerative changes are evident at the knee. IMPRESSION: Comminuted intertrochanteric fracture of the right femoral neck with varus angulation. Electronically Signed   By: Kennith Center M.D.   On: 11/12/2022 04:56     Medications:    sodium chloride     anticoagulant sodium citrate     methocarbamol (ROBAXIN) IV      acetaminophen  500 mg Oral Q6H   aspirin EC  325 mg Oral Q breakfast   Chlorhexidine Gluconate Cloth  6 each Topical  Q0600   clopidogrel  75 mg Oral Daily   docusate sodium  100 mg Oral BID   feeding supplement (GLUCERNA SHAKE)  237 mL Oral TID BM   furosemide  40 mg Oral Daily   glipiZIDE  2.5 mg Oral Q1200   insulin aspart  0-6 Units Subcutaneous TID WC   levothyroxine  100 mcg Oral q morning   midodrine  5 mg Oral TID WC   polyethylene glycol  17 g Oral Daily   potassium chloride SA  40 mEq Oral Daily   senna  1 tablet Oral BID   sodium chloride flush  3 mL Intravenous Q12H   sotalol  80 mg Oral BID   traMADol  50 mg Oral Q6H   sodium chloride, acetaminophen, alteplase, alum & mag hydroxide-simeth, anticoagulant sodium citrate, bisacodyl, fluticasone, HYDROcodone-acetaminophen, HYDROmorphone (DILAUDID) injection, lidocaine (PF), lidocaine-prilocaine, menthol-cetylpyridinium **OR** phenol, methocarbamol **OR** methocarbamol (ROBAXIN) IV, morphine injection, ondansetron **OR** ondansetron (ZOFRAN) IV, pentafluoroprop-tetrafluoroeth, sodium chloride flush  Assessment/ Plan:  Matthew [name] is a 88 y.o.  male with end stage renal disease on hemodialysis, coronary artery disease status post CABG, hypotension, hypothyroidism, congestive heart failure, diabetes mellitus type II who is admitted to [facility] on 11/12/2022 for Hip fracture (HCC) [S72.009A] End-stage renal disease on hemodialysis (HCC) [N18.6, Z99.2] Fall, initial encounter [W19.XXXA] Intertrochanteric fracture of right femur, closed, initial encounter (HCC) [S72.141A]  Underwent right hip surgery by Dr. Martha Clan  on 6/21.   [facility] MWF [address] RIJ permcath 98.5kg   End stage renal disease on hemodialysis. Dialysis today and then resume MWF schedule.   2. Anemia of chronic kidney disease Lab Results  Component Value Date   HGB 10.9 (L) 11/13/2022    mircera as outpatient.   3. Secondary Hyperparathyroidism: with hip fracture. Corrected calcium of 8.6  Lab Results  Component Value Date   CALCIUM 7.2 (L) 11/13/2022   CAION  1.16 04/15/2021   PHOS 4.3 02/14/2021    Previously on calcium carbonate with meals.   4.  Hypotension: midodrine before dialysis treatments. On metoprolol and furosemide on nondialysis days.    LOS: 1 Nakaya Mishkin 6/22/20242:45 PM

## 2022-11-13 NOTE — Assessment & Plan Note (Signed)
Paroxysmal in nature.  On sotalol.

## 2022-11-13 NOTE — Evaluation (Signed)
Occupational Therapy Evaluation Patient Details Name: Matthew [name] MRN: [id number] DOB: 1930-06-24 Today's Date: 11/13/2022   History of Present Illness Matthew [name] is a 89 y.o. male with medical history significant of multiple medical issues including atrial fibrillation, HFrEF, coronary artery disease, end-stage renal disease on hemodialysis Monday Wednesday Friday, GERD, hypertension, hyperlipidemia, type 2 diabetes presenting with fall and right hip fracture. He is s/p ORIF,11/12/22; He typically gets HD on MWF however due to surgery yesterday will be getting Dialysis today, Saturday 11/13/22.   Clinical Impression   89 yo Male s/p right hip ORIF on 11/12/22 after falling at home with subsequent hip fracture. He lives at home with his daughter and son-in-law. Lives in a 2 story home but his bed/bathroom is on the main floor. Patient has a rollator using at home and  being mostly modified independent in self care ADLs including bathing and dressing prior to fall. His daughter does the cooking, cleaning and driving- and they are retired and at home. Pt needs Min A supine to sitting with elevated head of bed and extra time/effort. He requires  mod A  +2 with maximum verbal cues and RW for sit<>stand and stand pivot transfer. Pt is Mod to Max A in LB  dressing using AE - had use it in the past, Min A for UE  dressing and bathing but decrease bilateral shoulder ROM. Ind in eating and grooming with setup. Because of weakness in R LE and pain he was unable to shift weight to RLE for ambulation or transfers. He would benefit from additional skilled OT intervention to improve and increase balance , safety , transfers in ADLs and IADLs.patient very motivated and eager to participate and improve independence.      Recommendations for follow up therapy are one component of a multi-disciplinary discharge planning process, led by the attending physician.  Recommendations may be updated based on patient status,  additional functional criteria and insurance authorization.   Assistance Recommended at Discharge    Patient can return home with the following      Functional Status Assessment     Equipment Recommendations       Recommendations for Other Services       Precautions / Restrictions Precautions Precautions: Fall Restrictions Weight Bearing Restrictions: Yes RLE Weight Bearing: Weight bearing as tolerated      Mobility Bed Mobility Overal bed mobility: Needs Assistance Bed Mobility: Sit to Supine       Sit to supine: Mod assist, +2 for safety/equipment, HOB elevated   General bed mobility comments: with maximum cues for trunk positioning and assistance to lift LE into bed;    Transfers Overall transfer level: Needs assistance Equipment used: Rolling walker (2 wheels) Transfers: Sit to/from Stand, Bed to chair/wheelchair/BSC Sit to Stand: +2 safety/equipment, Mod assist Stand pivot transfers: Min assist, +2 safety/equipment         General transfer comment: requires max VCs for hand placement and positioning including to advance RLE and keep LLE back for increased weight bearing in LLE for tolerance, able to pivot on RLE to transfer from chair to bed;      Balance Overall balance assessment: Needs assistance Sitting-balance support: Bilateral upper extremity supported, Feet supported Sitting balance-Leahy Scale: Fair Sitting balance - Comments: exhibits decreased forward lean due to right hip pain     Standing balance-Leahy Scale: Poor Standing balance comment: requires +2 assistance for balance during stand pivot transfer;  ADL either performed or assessed with clinical judgement   ADL                                         General ADL Comments: Independent with set of upper body dressing and bathing.  Max assist lower body dressing would recommend adaptive equipment.  Eating and grooming independent  with set up     Vision Baseline Vision/History: 1 Wears glasses Patient Visual Report: No change from baseline       Perception     Praxis      Pertinent Vitals/Pain Pain Assessment Pain Score: 6  Pain Location: right hip Pain Descriptors / Indicators: Aching, Sore     Hand Dominance Right   Extremity/Trunk Assessment Upper Extremity Assessment Upper Extremity Assessment: Overall WFL for tasks assessed (Decreased bilateral shoulder range of motion.  Arthritis.)   Lower Extremity Assessment Lower Extremity Assessment: Defer to PT evaluation RLE Deficits / Details: strength grossly 3-/5 with difficulty lifting against gravity; RLE Sensation: WNL LLE Deficits / Details: strength grossly 4-/5 LLE Sensation: WNL LLE Coordination: WNL   Cervical / Trunk Assessment Cervical / Trunk Assessment: Kyphotic   Communication Communication Communication: No difficulties   Cognition Arousal/Alertness: Awake/alert Behavior During Therapy: WFL for tasks assessed/performed Overall Cognitive Status: Within Functional Limits for tasks assessed                                 General Comments: oriented x4     General Comments       Exercises     Shoulder Instructions      Home Living Family/patient expects to be discharged to:: Private residence Living Arrangements: Children Available Help at Discharge: Family;Available 24 hours/day Type of Home: House Home Access: Stairs to enter [employer] of Steps: 7 total (3 at driveway and 4 at doorway) Entrance Stairs-Rails: Right;Left;Can reach both Home Layout: Able to live on main level with bedroom/bathroom;Two level     Bathroom Shower/Tub: Producer, television/film/video: Handicapped height Bathroom Accessibility: Yes   Home Equipment: Cane - single point;Rollator (4 wheels);Shower seat;Grab bars - toilet;Grab bars - tub/shower   Additional Comments: Lives with daughter and son-in-law.       Prior Functioning/Environment Prior Level of Function : Independent/Modified Independent             Mobility Comments: uses rollator for ambulation as needed; not driving; ADLs Comments: daughter does cooking/cleaning; he is Mod I for bathing/dressing;        OT Problem List: Decreased strength;Pain;Decreased range of motion;Decreased activity tolerance;Impaired balance (sitting and/or standing);Decreased knowledge of use of DME or AE;Impaired UE functional use;Decreased safety awareness      OT Treatment/Interventions: Self-care/ADL training;Therapeutic exercise;DME and/or AE instruction;Balance training;Patient/family education;Therapeutic activities    OT Goals(Current goals can be found in the care plan section) Acute Rehab OT Goals Patient Stated Goal: Want to get stronger so I can walk and go home OT Goal Formulation: With patient Time For Goal Achievement: 11/27/22 Potential to Achieve Goals: Good  OT Frequency: Min 1X/week    Co-evaluation   Reason for Co-Treatment: For patient/therapist safety;To address functional/ADL transfers PT goals addressed during session: Mobility/safety with mobility;Strengthening/ROM;Proper use of DME OT goals addressed during session: Proper use of Adaptive equipment and DME;Strengthening/ROM;ADL's and self-care      AM-PAC OT "  6 Clicks" Daily Activity     Outcome Measure Help from another person eating meals?: None Help from another person taking care of personal grooming?: A Little Help from another person toileting, which includes using toliet, bedpan, or urinal?: Total Help from another person bathing (including washing, rinsing, drying)?: A Lot Help from another person to put on and taking off regular upper body clothing?: A Little Help from another person to put on and taking off regular lower body clothing?: A Lot 6 Click Score: 15   End of Session Equipment Utilized During Treatment: Gait belt;Rolling walker (2 wheels) Nurse  Communication: Mobility status  Activity Tolerance: Patient tolerated treatment well Patient left: in bed;with bed alarm set  OT Visit Diagnosis: Repeated falls (R29.6);Muscle weakness (generalized) (M62.81);History of falling (Z91.81);Pain Pain - Right/Left: Right                Time: 3474-2595 OT Time Calculation (min): 31 min Charges:  OT General Charges $OT Visit: 1 Visit OT Evaluation $OT Eval Low Complexity: 1 Low    Randie Bloodgood OTR/L,CLT 11/13/2022, 4:13 PM

## 2022-11-13 NOTE — Plan of Care (Signed)
m °

## 2022-11-13 NOTE — Assessment & Plan Note (Signed)
Dialysis today since missed yesterday.

## 2022-11-13 NOTE — Evaluation (Signed)
Physical Therapy Evaluation (Co-eval with OT) Patient Details Name: Matthew [name] MRN: [id number] DOB: February 05, 1931 Today's Date: 11/13/2022  History of Present Illness  Matthew [name] is a 89 y.o. male with medical history significant of multiple medical issues including atrial fibrillation, HFrEF, coronary artery disease, end-stage renal disease on hemodialysis Monday Wednesday Friday, GERD, hypertension, hyperlipidemia, type 2 diabetes presenting with fall and right hip fracture. He is s/p ORIF,11/12/22; He typically gets HD on MWF however due to surgery yesterday will be getting Dialysis today, Saturday 11/13/22.  Clinical Impression  89 yo Male s/p right hip ORIF on 11/12/22 after falling at home with subsequent hip fracture. He lives at home with his daughter and son-in-law. Patient has a rollator which he uses for ambulation. He reports being mostly modified independent in self care ADLs including bathing and dressing prior to fall. His daughter does the cooking and cleaning. He is no longer driving. Mr. Sledge does have 7 steps total to enter his home (3 from driveway and then 4 more to the door). He lives in a 2 story home but his bed/bathroom is on the main floor. Currently he requires minimal physical assistance to transition from supine to sitting with elevated head of bed and extra time/effort. He requires moderate physical assistance +2 with maximum verbal cues and RW for sit<>stand and stand pivot transfer. Mr. Loll exhibits increased weakness in RLE grossly 3-/5 having difficulty lifting LE against gravity. He also has difficulty leaning forward for sit to stand transfer due to pain in right hip. Due to weakness and pain he was unable to shift weight to RLE for ambulation. He would benefit from additional skilled PT intervention to improve strength, balance and mobility. He is motivated and eager to improve strength and mobility.      Recommendations for follow up therapy are one  component of a multi-disciplinary discharge planning process, led by the attending physician.  Recommendations may be updated based on patient status, additional functional criteria and insurance authorization.  Follow Up Recommendations Can patient physically be transported by private vehicle: No     Assistance Recommended at Discharge Frequent or constant Supervision/Assistance  Patient can return home with the following  Two people to help with walking and/or transfers;A lot of help with bathing/dressing/bathroom;Assistance with cooking/housework;Assist for transportation;Help with stairs or ramp for entrance    Equipment Recommendations None recommended by PT  Recommendations for Other Services       Functional Status Assessment Patient has had a recent decline in their functional status and demonstrates the ability to make significant improvements in function in a reasonable and predictable amount of time.     Precautions / Restrictions Precautions Precautions: Fall Restrictions Weight Bearing Restrictions: Yes RLE Weight Bearing: Weight bearing as tolerated      Mobility  Bed Mobility Overal bed mobility: Needs Assistance Bed Mobility: Supine to Sit     Supine to sit: Min assist, HOB elevated     General bed mobility comments: with cues for hand placement and positioning, requires extra time;    Transfers Overall transfer level: Needs assistance Equipment used: Rolling walker (2 wheels) Transfers: Sit to/from Stand, Bed to chair/wheelchair/BSC Sit to Stand: Mod assist, +2 physical assistance Stand pivot transfers: Mod assist, +2 physical assistance         General transfer comment: requires max VCs for hand placement and positioning;    Ambulation/Gait               General Gait Details:  pt unable to ambulate at this time as he is unable to step with LLE due to weakness in RLE;  Stairs            Wheelchair Mobility    Modified Rankin  (Stroke Patients Only)       Balance Overall balance assessment: Needs assistance Sitting-balance support: Bilateral upper extremity supported, Feet supported Sitting balance-Leahy Scale: Fair Sitting balance - Comments: exhibits decreased forward lean due to right hip pain     Standing balance-Leahy Scale: Poor Standing balance comment: requires +2 assistance for balance during stand pivot transfer;                             Pertinent Vitals/Pain Pain Assessment Pain Assessment: 0-10 Pain Score: 3  Pain Location: right hip Pain Descriptors / Indicators: Aching, Sore Pain Intervention(s): Limited activity within patient's tolerance, Monitored during session, Repositioned    Home Living Family/patient expects to be discharged to:: Private residence Living Arrangements: Children Available Help at Discharge: Family;Available 24 hours/day Type of Home: House Home Access: Stairs to enter Entrance Stairs-Rails: Right;Left;Can reach both Entrance Stairs-Number of Steps: 7 total (3 at driveway and 4 at doorway)   Home Layout: Able to live on main level with bedroom/bathroom;Two level Home Equipment: Cane - single point;Rollator (4 wheels);Shower seat;Grab bars - toilet;Grab bars - tub/shower Additional Comments: Lives with daughter and son-in-law.    Prior Function Prior Level of Function : Independent/Modified Independent             Mobility Comments: uses rollator for ambulation as needed; not driving; ADLs Comments: daughter does cooking/cleaning; he is Mod I for bathing/dressing;     Hand Dominance   Dominant Hand: Right    Extremity/Trunk Assessment   Upper Extremity Assessment Upper Extremity Assessment: Defer to OT evaluation    Lower Extremity Assessment Lower Extremity Assessment: RLE deficits/detail;LLE deficits/detail RLE Deficits / Details: strength grossly 3-/5 with difficulty lifting against gravity; RLE Sensation: WNL LLE Deficits /  Details: strength grossly 4-/5 LLE Sensation: WNL LLE Coordination: WNL    Cervical / Trunk Assessment Cervical / Trunk Assessment: Kyphotic  Communication   Communication: No difficulties  Cognition Arousal/Alertness: Awake/alert Behavior During Therapy: WFL for tasks assessed/performed Overall Cognitive Status: Within Functional Limits for tasks assessed                                 General Comments: oriented x4        General Comments      Exercises Total Joint Exercises Ankle Circles/Pumps: AROM, Both, 5 reps Gluteal Sets: AROM, Both, 5 reps Short Arc Quad: AROM, Right, 5 reps Hip ABduction/ADduction: AAROM, Right, 5 reps Straight Leg Raises: AAROM, Right, 5 reps Marching in Standing:  (attempted marching but patient unable to shift weight to RLE) Other Exercises Other Exercises: Instructed patient in LE seated exercise as part of HEP including ankle pump, SAQ and hip abduction/adduction to increase flexibility and reduce stiffness while sitting up in chair. Pt verbalized understanding; (x8 minutes)   Assessment/Plan    PT Assessment Patient needs continued PT services  PT Problem List Decreased strength;Decreased balance;Pain;Decreased range of motion;Decreased mobility;Decreased activity tolerance       PT Treatment Interventions DME instruction;Functional mobility training;Balance training;Patient/family education;Gait training;Therapeutic activities;Stair training;Therapeutic exercise    PT Goals (Current goals can be found in the Care Plan section)  Acute Rehab PT  Goals Patient Stated Goal: to get stronger PT Goal Formulation: With patient Time For Goal Achievement: 11/27/22 Potential to Achieve Goals: Good    Frequency BID     Co-evaluation PT/OT/SLP Co-Evaluation/Treatment: Yes Reason for Co-Treatment: For patient/therapist safety;To address functional/ADL transfers PT goals addressed during session: Mobility/safety with  mobility;Balance;Strengthening/ROM;Proper use of DME OT goals addressed during session: ADL's and self-care;Proper use of Adaptive equipment and DME;Strengthening/ROM       AM-PAC PT "6 Clicks" Mobility  Outcome Measure Help needed turning from your back to your side while in a flat bed without using bedrails?: A Little Help needed moving from lying on your back to sitting on the side of a flat bed without using bedrails?: A Lot Help needed moving to and from a bed to a chair (including a wheelchair)?: A Lot Help needed standing up from a chair using your arms (e.g., wheelchair or bedside chair)?: A Lot Help needed to walk in hospital room?: Total Help needed climbing 3-5 steps with a railing? : Total 6 Click Score: 11    End of Session Equipment Utilized During Treatment: Gait belt Activity Tolerance: Patient tolerated treatment well Patient left: in chair;with call bell/phone within reach;with chair alarm set Nurse Communication: Mobility status PT Visit Diagnosis: Muscle weakness (generalized) (M62.81);History of falling (Z91.81);Difficulty in walking, not elsewhere classified (R26.2);Pain Pain - Right/Left: Right Pain - part of body: Hip    Time: 2956-2130 PT Time Calculation (min) (ACUTE ONLY): 43 min   Charges:   PT Evaluation $PT Eval Low Complexity: 1 Low PT Treatments $Therapeutic Exercise: 8-22 mins         Casee Knepp PT, DPT 11/13/2022, 3:22 PM

## 2022-11-13 NOTE — Hospital Course (Signed)
89 y.o. male with medical history significant of multiple medical issues including atrial fibrillation, HFrEF, coronary artery disease, end-stage renal disease on hemodialysis Monday Wednesday Friday, GERD, hypertension, hyperlipidemia, type 2 diabetes presenting with fall and right hip fracture.  History from patient as well as daughter.  Per the daughter, patient went to get up to use the bathroom.  Slipped and fell on his right hip with secondary hip fracture.  No reported head trauma loss conscious.  No reported weakness or dizziness prior to episode.  Baseline ESRD on hemodialysis Monday Wednesday Friday.  No missed dialysis sessions.  Follows Dr. Welton Flakes outpatient from a cardiology standpoint.  Has had some mild medication changes secondary to elevated liver function.  6/22.  Postoperative day 1 for right intertrochanteric hip fracture with intramedullary fixation.  AST and ALT normalized. 6/23.  Postoperative day 2.  Continue pain control. 6/24.  Postoperative day 3.  Patient needs to have a bowel movement. 6/25.  Postoperative day 4.  Patient still has not had a bowel movement despite medications prescribed.  Treated hyperkalemia this morning.

## 2022-11-13 NOTE — Assessment & Plan Note (Signed)
On crestor.   

## 2022-11-13 NOTE — Assessment & Plan Note (Addendum)
S/p 5 vessel CABG 2001.  On aspirin and Plavix.

## 2022-11-13 NOTE — Assessment & Plan Note (Deleted)
PPI ?

## 2022-11-13 NOTE — Assessment & Plan Note (Signed)
2d ECHO 12/2017 w/ EF 45% Dialysis to manage fluid.  Blood pressure too low for other cardiac meds.

## 2022-11-13 NOTE — Progress Notes (Addendum)
Subjective: 1 Day Post-Op Procedure(s) (LRB): INTRAMEDULLARY (IM) NAIL INTERTROCHANTERIC (Right)   Patient is awake alert and sitting up in chair.  Is comfortable.  He is waiting on dialysis later today.  Dressings are dry.  His hemoglobin is stable at 10.9.  Patient reports pain as mild.  Objective:   VITALS:   Vitals:   11/13/22 0410 11/13/22 0835  BP: 102/66 102/62  Pulse:  (!) 58  Resp:  16  Temp:  97.6 F (36.4 C)  SpO2:  99%    Neurologically intact Incision: dressing C/D/I  LABS Recent Labs    11/12/22 0404 11/13/22 0656  HGB 12.3* 10.9*  HCT 37.1* 33.1*  WBC 4.6 9.7  PLT 110* 103*    Recent Labs    11/12/22 0404 11/13/22 0522  NA 134* 135  K 4.7 4.9  BUN 87* 85*  CREATININE 3.93* 3.72*  GLUCOSE 131* 155*    Recent Labs    11/12/22 0618  INR 1.1     Assessment/Plan: 1 Day Post-Op Procedure(s) (LRB): INTRAMEDULLARY (IM) NAIL INTERTROCHANTERIC (Right)   Advance diet Up with therapy Discharge to SNF

## 2022-11-13 NOTE — Assessment & Plan Note (Addendum)
Last hemoglobin A1c 5.9.  On Glucotrol.

## 2022-11-13 NOTE — Plan of Care (Signed)
Patient A&Ox4, from home, up with 2 assist and walker in room. Patient had difficulties with attempting to use commode. His left leg did not want to move while getting out of bed. Patient was sat back down in bed and offered bedpan, but he declined at this time. Pain partially controlled with medication, repositioning, and rest.

## 2022-11-13 NOTE — Assessment & Plan Note (Signed)
AST and ALT normalized.

## 2022-11-13 NOTE — Progress Notes (Signed)
Progress Note   Patient: Matthew [name] UEA:[id number] DOB: [birth year] DOA: 11/12/2022     1 DOS: the patient was seen and examined on 11/13/2022   Brief hospital course: 89 y.o. male with medical history significant of multiple medical issues including atrial fibrillation, HFrEF, coronary artery disease, end-stage renal disease on hemodialysis Monday Wednesday Friday, GERD, hypertension, hyperlipidemia, type 2 diabetes presenting with fall and right hip fracture.  History from patient as well as daughter.  Per the daughter, patient went to get up to use the bathroom.  Slipped and fell on his right hip with secondary hip fracture.  No reported head trauma loss conscious.  No reported weakness or dizziness prior to episode.  Baseline ESRD on hemodialysis Monday Wednesday Friday.  No missed dialysis sessions.  Follows Dr. Welton Flakes outpatient from a cardiology standpoint.  Has had some mild medication changes secondary to elevated liver function.  6/22.  Postoperative day 1 for right intertrochanteric hip fracture with intramedullary fixation.  AST and ALT normalized.  Assessment and Plan: * Closed hip fracture requiring operative repair, right, sequela Postoperative day 1.  Patient not having any pain this morning when I saw him.  PT evaluation.  Likely will end up needing rehab.  End-stage renal disease on hemodialysis [facility]) Dialysis today since missed yesterday.   Chronic hypotension On midodrine  Atrial fibrillation (HCC) Paroxysmal in nature.  On sotalol.  CAD (coronary artery disease) S/p 5 vessel CABG 2001.  On aspirin and Plavix.     HFrEF (heart failure with reduced ejection fraction) (HCC) 2d ECHO 12/2017 w/ EF 45% Dialysis to manage fluid.  Blood pressure too low for other cardiac meds.  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  Dialysis will be done today.  On Glucotrol.  HLD (hyperlipidemia) On crestor  Transaminitis AST and ALT  normalized        Subjective: Patient seen this morning.  Offers no complaints.  Not even having pain in his hip.  Postoperative day 1 for right hip fracture.  Physical Exam: Vitals:   11/13/22 0100 11/13/22 0401 11/13/22 0410 11/13/22 0835  BP: (!) 106/49 (!) 97/47 102/66 102/62  Pulse: (!) 55 (!) 55  (!) 58  Resp:  16  16  Temp:  97.9 F (36.6 C)  97.6 F (36.4 C)  TempSrc:      SpO2:  96%  99%  Weight:      Height:       Physical Exam HENT:     Head: Normocephalic.     Mouth/Throat:     Pharynx: No oropharyngeal exudate.  Eyes:     General: Lids are normal.     Conjunctiva/sclera: Conjunctivae normal.  Cardiovascular:     Rate and Rhythm: Normal rate and regular rhythm.     Heart sounds: Normal heart sounds, S1 normal and S2 normal.  Pulmonary:     Breath sounds: No decreased breath sounds, wheezing, rhonchi or rales.  Abdominal:     Palpations: Abdomen is soft.     Tenderness: There is no abdominal tenderness.  Musculoskeletal:     Right lower leg: No swelling.     Left lower leg: No swelling.  Skin:    General: Skin is warm.     Findings: No rash.  Neurological:     Mental Status: He is alert and oriented to person, place, and time.     Comments: Able to straight leg raise with left leg.  Unable  to straight leg raise with right leg     Data Reviewed: Creatinine 3.72, AST 35, ALT 30, hemoglobin 10.9, platelet count 103  Family Communication: Spoke with daughter at bedside.  Disposition: Status is: Inpatient Remains inpatient appropriate because: Postoperative day 1 right hip fracture intramedullary fixation  Planned Discharge Destination: Rehab    Time spent: 28 minutes  Author: Alford Highland, MD 11/13/2022 1:32 PM  For on call review [email or web address].

## 2022-11-13 NOTE — Progress Notes (Signed)
Hemodialysis note  Received patient in bed to unit. Alert and oriented.  Informed consent signed and in chart.  Treatment initiated: 1457 Treatment completed: 1841  Patient tolerated well. Transported back to room, alert without acute distress.  Report given to patient's RN.   Access used: LUA AVF Access issues: High venous pressure  Total UF removed: 500 ml Medication(s) given:  Midodrine 5 mg tab PO  Post HD weight: 99.2 kg   [name]

## 2022-11-13 NOTE — Assessment & Plan Note (Signed)
On midodrine 

## 2022-11-13 NOTE — Progress Notes (Signed)
Physical Therapy Treatment Co-treat with OT Patient Details Name: Matthew [name] MRN: [id number] DOB: 1931/04/21 Today's Date: 11/13/2022   History of Present Illness Matthew [name] is a 89 y.o. male with medical history significant of multiple medical issues including atrial fibrillation, HFrEF, coronary artery disease, end-stage renal disease on hemodialysis Monday Wednesday Friday, GERD, hypertension, hyperlipidemia, type 2 diabetes presenting with fall and right hip fracture. He is s/p ORIF,11/12/22; He typically gets HD on MWF however due to surgery yesterday will be getting Dialysis today, Saturday 11/13/22.    PT Comments    89 yo Male was having difficulty transferring from bedside chair to bed to transfer to dialysis. RN requested therapy to assist with transfer. PT/OT went into patient room. Adjusted room set up moving chair to other side of bed to allow patient to pivot towards stronger side (Left side). PT instructed patient in LE ROM exercise to facilitate increased flexibility and strength. He reports doing HEP this morning while sitting in chair and has been trying to move RLE as tolerated. Patient continues to require assistance for increased ROM (AAROM) when lifting right leg against gravity due to weakness. He required mod A +2 for sit to stand transfer but then was able to pivot to bed with min A +2 with increased upright posture and better weight acceptance. Patient required mod A +2 to transition to supine and be repositioned in bed. He reports increased RLE hip pain due to recent transfer, less pain when resting in bed. Patient would benefit from additional skilled PT intervention to improve strength, balance and gait mobility.   Recommendations for follow up therapy are one component of a multi-disciplinary discharge planning process, led by the attending physician.  Recommendations may be updated based on patient status, additional functional criteria and insurance  authorization.  Follow Up Recommendations  Can patient physically be transported by private vehicle: No    Assistance Recommended at Discharge Frequent or constant Supervision/Assistance  Patient can return home with the following Two people to help with walking and/or transfers;A lot of help with bathing/dressing/bathroom;Assistance with cooking/housework;Assist for transportation;Help with stairs or ramp for entrance   Equipment Recommendations  None recommended by PT    Recommendations for Other Services       Precautions / Restrictions Precautions Precautions: Fall Restrictions Weight Bearing Restrictions: Yes RLE Weight Bearing: Weight bearing as tolerated     Mobility  Bed Mobility Overal bed mobility: Needs Assistance Bed Mobility: Sit to Supine     Supine to sit: Min assist, HOB elevated Sit to supine: Mod assist, +2 for safety/equipment, HOB elevated   General bed mobility comments: with maximum cues for trunk positioning and assistance to lift LE into bed;    Transfers Overall transfer level: Needs assistance Equipment used: Rolling walker (2 wheels) Transfers: Sit to/from Stand, Bed to chair/wheelchair/BSC Sit to Stand: +2 safety/equipment, Mod assist Stand pivot transfers: Min assist, +2 safety/equipment         General transfer comment: requires max VCs for hand placement and positioning including to advance RLE and keep LLE back for increased weight bearing in LLE for tolerance, able to pivot on RLE to transfer from chair to bed;    Ambulation/Gait               General Gait Details: pt unable to ambulate at this time as he is unable to step with LLE due to weakness in RLE;   Stairs  Wheelchair Mobility    Modified Rankin (Stroke Patients Only)       Balance Overall balance assessment: Needs assistance Sitting-balance support: Bilateral upper extremity supported, Feet supported Sitting balance-Leahy Scale:  Fair Sitting balance - Comments: exhibits decreased forward lean due to right hip pain     Standing balance-Leahy Scale: Poor Standing balance comment: requires +2 assistance for balance during stand pivot transfer;                            Cognition Arousal/Alertness: Awake/alert Behavior During Therapy: WFL for tasks assessed/performed Overall Cognitive Status: Within Functional Limits for tasks assessed                                 General Comments: oriented x4        Exercises Total Joint Exercises Ankle Circles/Pumps: AROM, Both, 5 reps Gluteal Sets: AROM, Both, 5 reps Short Arc Quad: AROM, Right, 5 reps Hip ABduction/ADduction: AAROM, Right, 5 reps Straight Leg Raises: AAROM, Right, 5 reps Long Arc Quad: AROM, Both, 5 reps Marching in Standing:  (attempted marching but patient unable to shift weight to RLE) Other Exercises Other Exercises: Instructed patient in seated hip flexion march x5 reps each LE, requiring AAROM on RLE for increased ROM to facilitate increased mobility prior to transfer;    General Comments        Pertinent Vitals/Pain Pain Assessment Pain Assessment: 0-10 Pain Score: 6  Pain Location: right hip Pain Descriptors / Indicators: Aching, Sore Pain Intervention(s): Limited activity within patient's tolerance, Monitored during session, Repositioned    Home Living Family/patient expects to be discharged to:: Private residence Living Arrangements: Children Available Help at Discharge: Family;Available 24 hours/day Type of Home: House Home Access: Stairs to enter Entrance Stairs-Rails: Right;Left;Can reach both Entrance Stairs-Number of Steps: 7 total (3 at driveway and 4 at doorway)   Home Layout: Able to live on main level with bedroom/bathroom;Two level Home Equipment: Cane - single point;Rollator (4 wheels);Shower seat;Grab bars - toilet;Grab bars - tub/shower Additional Comments: Lives with daughter and  son-in-law.    Prior Function            PT Goals (current goals can now be found in the care plan section) Acute Rehab PT Goals Patient Stated Goal: to get stronger PT Goal Formulation: With patient Time For Goal Achievement: 11/27/22 Potential to Achieve Goals: Good Progress towards PT goals: Progressing toward goals    Frequency    BID      PT Plan Current plan remains appropriate    Co-evaluation PT/OT/SLP Co-Evaluation/Treatment: Yes Reason for Co-Treatment: For patient/therapist safety;To address functional/ADL transfers PT goals addressed during session: Mobility/safety with mobility;Strengthening/ROM;Proper use of DME OT goals addressed during session: Proper use of Adaptive equipment and DME;Strengthening/ROM;ADL's and self-care      AM-PAC PT "6 Clicks" Mobility   Outcome Measure  Help needed turning from your back to your side while in a flat bed without using bedrails?: A Little Help needed moving from lying on your back to sitting on the side of a flat bed without using bedrails?: A Lot Help needed moving to and from a bed to a chair (including a wheelchair)?: A Lot Help needed standing up from a chair using your arms (e.g., wheelchair or bedside chair)?: A Lot Help needed to walk in hospital room?: Total Help needed climbing 3-5 steps with a railing? :  Total 6 Click Score: 11    End of Session Equipment Utilized During Treatment: Gait belt Activity Tolerance: Patient tolerated treatment well Patient left: with call bell/phone within reach;in bed;with bed alarm set Nurse Communication: Mobility status PT Visit Diagnosis: Muscle weakness (generalized) (M62.81);History of falling (Z91.81);Difficulty in walking, not elsewhere classified (R26.2);Pain Pain - Right/Left: Right Pain - part of body: Hip     Time: 6433-2951 PT Time Calculation (min) (ACUTE ONLY): 18 min  Charges:   $Therapeutic Activity: 8-22 mins                        Malayzia Laforte PT, DPT 11/13/2022, 3:33 PM

## 2022-11-13 NOTE — Assessment & Plan Note (Signed)
Postoperative day 4.  Patient having soreness in the hip.  PT evaluation appreciated.  Will end up needing rehab.  Increased MiraLAX to twice daily.  Lactulose and Dulcolax ordered.

## 2022-11-14 DIAGNOSIS — S72001S Fracture of unspecified part of neck of right femur, sequela: Secondary | ICD-10-CM | POA: Diagnosis not present

## 2022-11-14 DIAGNOSIS — N186 End stage renal disease: Secondary | ICD-10-CM | POA: Diagnosis not present

## 2022-11-14 DIAGNOSIS — I480 Paroxysmal atrial fibrillation: Secondary | ICD-10-CM | POA: Diagnosis not present

## 2022-11-14 DIAGNOSIS — I9589 Other hypotension: Secondary | ICD-10-CM | POA: Diagnosis not present

## 2022-11-14 LAB — CBC
HCT: 29.6 % — ABNORMAL LOW (ref 39.0–52.0)
Hemoglobin: 9.8 g/dL — ABNORMAL LOW (ref 13.0–17.0)
MCH: 32.7 pg (ref 26.0–34.0)
MCHC: 33.1 g/dL (ref 30.0–36.0)
MCV: 98.7 fL (ref 80.0–100.0)
Platelets: 100 10*3/uL — ABNORMAL LOW (ref 150–400)
RBC: 3 MIL/uL — ABNORMAL LOW (ref 4.22–5.81)
RDW: 14.6 % (ref 11.5–15.5)
WBC: 8.1 10*3/uL (ref 4.0–10.5)
nRBC: 0 % (ref 0.0–0.2)

## 2022-11-14 LAB — GLUCOSE, CAPILLARY
Glucose-Capillary: 108 mg/dL — ABNORMAL HIGH (ref 70–99)
Glucose-Capillary: 122 mg/dL — ABNORMAL HIGH (ref 70–99)
Glucose-Capillary: 143 mg/dL — ABNORMAL HIGH (ref 70–99)
Glucose-Capillary: 122 mg/dL — ABNORMAL HIGH (ref 70–99)

## 2022-11-14 MED ORDER — POLYETHYLENE GLYCOL 3350 17 G PO PACK
17.0000 g | PACK | Freq: Two times a day (BID) | ORAL | Status: DC
  Administered 2022-11-14 – 2022-11-18 (×5): 17 g via ORAL
  Filled 2022-11-14 (×5): qty 1

## 2022-11-14 NOTE — Plan of Care (Signed)
  Problem: Activity: Goal: Risk for activity intolerance will decrease Outcome: Progressing   Problem: Nutrition: Goal: Adequate nutrition will be maintained Outcome: Progressing   Problem: Pain Managment: Goal: General experience of comfort will improve Outcome: Progressing   

## 2022-11-14 NOTE — Progress Notes (Signed)
[facility]  ROUNDING NOTE   Subjective:   Matthew [name] is an 89 year old male with medical concerns including CAD status post CABG, hypothyroidism, hypertension, CHF, type 2 diabetes, and end-stage renal disease on hemodialysis. He presents to the ED after suffering a fall and rotation and shortened right leg. He has been admitted for Hip fracture [facility]) [S72.009A] End-stage renal disease on hemodialysis (HCC) [N18.6, Z99.2] Fall, initial encounter [W19.XXXA] Intertrochanteric fracture of right femur, closed, initial encounter (HCC) [S72.141A]  Hemodialysis treatment yesterday. Tolerated treatment well. UF of  Objective:  Vital signs in last 24 hours:  Temp:  [97.6 F (36.4 C)-98 F (36.7 C)] 97.6 F (36.4 C) (06/23 0744) Pulse Rate:  [53-73] 64 (06/23 0744) Resp:  [11-24] 16 (06/23 0744) BP: (85-107)/(40-50) 106/46 (06/23 0744) SpO2:  [96 %-100 %] 100 % (06/23 0744) Weight:  [92.2 kg-92.5 kg] 92.2 kg (06/22 1848)  Weight change: 0 kg Filed Weights   11/12/22 1204 11/13/22 1443 11/13/22 1848  Weight: 92.5 kg 92.5 kg 92.2 kg    Intake/Output: I/O last 3 completed shifts: In: 2783.6 [P.O.:960; I.V.:117.4; IV Piggyback:1706.2] Out: 700 [Urine:200; Other:500]   Intake/Output this shift:  Total I/O In: -  Out: 100 [Urine:100]  Physical Exam: General: NAD, seated in chair  Head: Normocephalic, atraumatic. Moist oral mucosal membranes  Eyes: Anicteric  Lungs:  Clear to auscultation  Heart: Regular rate and rhythm  Abdomen:  Soft, nontender  Extremities:  No peripheral edema, right hip with clean dressings.    Neurologic: Nonfocal, moving all four extremities  Skin: No lesions  Access: Left AVF    Basic Metabolic Panel: Recent Labs  Lab 11/12/22 0404 11/13/22 0522  NA 134* 135  K 4.7 4.9  CL 98 107  CO2 27 18*  GLUCOSE 131* 155*  BUN 87* 85*  CREATININE 3.93* 3.72*  CALCIUM 8.6* 7.2*     Liver Function Tests: Recent Labs  Lab  11/13/22 0522  AST 35  ALT 30  ALKPHOS 103  BILITOT 0.6  PROT 5.2*  ALBUMIN 2.4*    No results for input(s): "LIPASE", "AMYLASE" in the last 168 hours. No results for input(s): "AMMONIA" in the last 168 hours.  CBC: Recent Labs  Lab 11/12/22 0404 11/13/22 0656 11/14/22 0856  WBC 4.6 9.7 8.1  NEUTROABS 2.6  --   --   HGB 12.3* 10.9* 9.8*  HCT 37.1* 33.1* 29.6*  MCV 98.1 98.5 98.7  PLT 110* 103* 100*     Cardiac Enzymes: No results for input(s): "CKTOTAL", "CKMB", "CKMBINDEX", "TROPONINI" in the last 168 hours.  BNP: Invalid input(s): "POCBNP"  CBG: Recent Labs  Lab 11/12/22 1817 11/13/22 0825 11/13/22 1140 11/13/22 2151 11/14/22 0800  GLUCAP 169* 158* 179* 150* 108*     Microbiology: Results for orders placed or performed in visit on 10/15/22  Culture, Urine     Status: Abnormal   Collection Time: 10/15/22  2:05 PM   Specimen: Urine   UC  Result Value Ref Range Status   Urine Culture, Routine Final report (A)  Final   Organism ID, Bacteria Escherichia coli (A)  Final    Comment: Cefazolin <=4 ug/mL Cefazolin with an MIC <=16 predicts susceptibility to the oral agents cefaclor, cefdinir, cefpodoxime, cefprozil, cefuroxime, cephalexin, and loracarbef when used for therapy of uncomplicated urinary tract infections due to E. coli, Klebsiella pneumoniae, and Proteus mirabilis. Greater than 100,000 colony forming units per mL    Antimicrobial Susceptibility Comment  Final    Comment:       **  S = Susceptible; I = Intermediate; R = Resistant **                    P = Positive; N = Negative             MICS are expressed in micrograms per mL    Antibiotic                 RSLT#1    RSLT#2    RSLT#3    RSLT#4 Amoxicillin/Clavulanic Acid    S Ampicillin                     S Cefepime                       S Ceftriaxone                    S Cefuroxime                     S Ciprofloxacin                  S Ertapenem                      S Gentamicin                      S Imipenem                       S Levofloxacin                   S Meropenem                      S Nitrofurantoin                 S Piperacillin/Tazobactam        S Tetracycline                   S Tobramycin                     S Trimethoprim/Sulfa             S     Coagulation Studies: Recent Labs    11/12/22 0618  LABPROT 14.2  INR 1.1     Urinalysis: No results for input(s): "COLORURINE", "LABSPEC", "PHURINE", "GLUCOSEU", "HGBUR", "BILIRUBINUR", "KETONESUR", "PROTEINUR", "UROBILINOGEN", "NITRITE", "LEUKOCYTESUR" in the last 72 hours.  Invalid input(s): "APPERANCEUR"    Imaging: DG FEMUR, MIN 2 VIEWS RIGHT  Result Date: 11/12/2022 CLINICAL DATA:  Status post intramedullary rod fixation of right femur fracture. EXAM: RIGHT FEMUR 2 VIEWS COMPARISON:  November 12, 2022. FINDINGS: Status post intramedullary rod fixation of intertrochanteric fracture involving proximal right femur. Good alignment of fracture components is noted. Expected postsurgical changes are seen in the surrounding soft tissues. IMPRESSION: Status post intramedullary rod fixation of proximal right femoral intertrochanteric fracture. Electronically Signed   By: Lupita Raider M.D.   On: 11/12/2022 15:36   DG HIP UNILAT WITH PELVIS 2-3 VIEWS RIGHT  Result Date: 11/12/2022 CLINICAL DATA:  Surgery, elective EXAM: DG HIP (WITH OR WITHOUT PELVIS) 2-3V RIGHT COMPARISON:  None Available. FINDINGS: Fluoro time: 1 minute and 30 seconds. Four C-arm fluoroscopic images were obtained intraoperatively and submitted for post operative interpretation. These images demonstrate surgical changes associated with intramedullary nail  and screw fixation of the femur. Please see the performing provider's procedural report for further detail. IMPRESSION: Intraoperative fluoroscopic imaging, as detailed above. Electronically Signed   By: Feliberto Harts M.D.   On: 11/12/2022 14:35   DG C-Arm 1-60 Min-No Report  Result  Date: 11/12/2022 Fluoroscopy was utilized by the requesting physician.  No radiographic interpretation.     Medications:    sodium chloride     anticoagulant sodium citrate     methocarbamol (ROBAXIN) IV      aspirin EC  325 mg Oral Q breakfast   Chlorhexidine Gluconate Cloth  6 each Topical Q0600   clopidogrel  75 mg Oral Daily   docusate sodium  100 mg Oral BID   feeding supplement (GLUCERNA SHAKE)  237 mL Oral TID BM   furosemide  40 mg Oral Daily   glipiZIDE  2.5 mg Oral Q1200   insulin aspart  0-6 Units Subcutaneous TID WC   levothyroxine  100 mcg Oral q morning   midodrine  5 mg Oral TID WC   polyethylene glycol  17 g Oral Daily   potassium chloride SA  40 mEq Oral Daily   senna  1 tablet Oral BID   sodium chloride flush  3 mL Intravenous Q12H   sotalol  80 mg Oral BID   traMADol  50 mg Oral Q6H   sodium chloride, acetaminophen, alteplase, alum & mag hydroxide-simeth, anticoagulant sodium citrate, bisacodyl, fluticasone, HYDROcodone-acetaminophen, HYDROmorphone (DILAUDID) injection, lidocaine (PF), lidocaine-prilocaine, menthol-cetylpyridinium **OR** phenol, methocarbamol **OR** methocarbamol (ROBAXIN) IV, morphine injection, ondansetron **OR** ondansetron (ZOFRAN) IV, pentafluoroprop-tetrafluoroeth, sodium chloride flush  Assessment/ Plan:  Mr. JAHIEM FRANZONI is a 89 y.o.  male with end stage renal disease on hemodialysis, coronary artery disease status post CABG, hypotension, hypothyroidism, congestive heart failure, diabetes mellitus type II who is admitted to [facility] on 11/12/2022 for Hip fracture (HCC) [S72.009A] End-stage renal disease on hemodialysis (HCC) [N18.6, Z99.2] Fall, initial encounter [W19.XXXA] Intertrochanteric fracture of right femur, closed, initial encounter (HCC) [S72.141A]  Underwent right hip surgery by Dr. Martha Clan on 6/21.   [facility] MWF [address] RIJ permcath 98.5kg   End stage renal disease on hemodialysis. Continue MWF schedule.   2.  Anemia of chronic kidney disease Lab Results  Component Value Date   HGB 9.8 (L) 11/14/2022    mircera as outpatient.   3. Secondary Hyperparathyroidism: with hip fracture.   Lab Results  Component Value Date   CALCIUM 7.2 (L) 11/13/2022   CAION 1.16 04/15/2021   PHOS 4.3 02/14/2021    Previously on calcium carbonate with meals.   4.  Hypotension: midodrine before dialysis treatments. On metoprolol and furosemide on nondialysis days.    LOS: 2 Rachelle Edwards 6/23/202411:27 AM

## 2022-11-14 NOTE — Progress Notes (Signed)
Subjective: 2 Days Post-Op Procedure(s) (LRB): INTRAMEDULLARY (IM) NAIL INTERTROCHANTERIC (Right) Patient is out of bed to chair and looks and feels well.  His daughter is present.  He has little pain.  He does not walk very far yet.  Hemoglobin stable at 9.8.  He had dialysis yesterday.  Exam shows the range of motion of the hip and knee are satisfactory.  Wound dressings are dry.  Patient reports pain as mild.  Objective:   VITALS:   Vitals:   11/13/22 2153 11/14/22 0744  BP: (!) 107/46 (!) 106/46  Pulse: 66 64  Resp: 20 16  Temp: 98 F (36.7 C) 97.6 F (36.4 C)  SpO2: 100% 100%    Neurologically intact Incision: dressing C/D/I  LABS Recent Labs    11/12/22 0404 11/13/22 0656 11/14/22 0856  HGB 12.3* 10.9* 9.8*  HCT 37.1* 33.1* 29.6*  WBC 4.6 9.7 8.1  PLT 110* 103* 100*    Recent Labs    11/12/22 0404 11/13/22 0522  NA 134* 135  K 4.7 4.9  BUN 87* 85*  CREATININE 3.93* 3.72*  GLUCOSE 131* 155*    Recent Labs    11/12/22 0618  INR 1.1     Assessment/Plan: 2 Days Post-Op Procedure(s) (LRB): INTRAMEDULLARY (IM) NAIL INTERTROCHANTERIC (Right)   Advance diet Up with therapy Discharge to SNF

## 2022-11-14 NOTE — Progress Notes (Signed)
[facility]    SUBJECTIVE: Resting comfortably in bed denies palpitations tachycardia or pain   Vitals:   11/13/22 1848 11/13/22 2153 11/14/22 0744 11/14/22 1627  BP:  (!) 107/46 (!) 106/46 (!) 109/48  Pulse:  66 64 63  Resp:  20 16 16   Temp:  98 F (36.7 C) 97.6 F (36.4 C) (!) 97.4 F (36.3 C)  TempSrc:      SpO2:  100% 100% 96%  Weight: 92.2 kg     Height:         Intake/Output Summary (Last 24 hours) at 11/14/2022 1719 Last data filed at 11/14/2022 0847 Gross per 24 hour  Intake --  Output 600 ml  Net -600 ml      PHYSICAL EXAM  General: Well developed, well nourished, in no acute distress HEENT:  Normocephalic and atramatic Neck:  No JVD.  Lungs: Clear bilaterally to auscultation and percussion. Heart: HRRR . Normal S1 and S2 without gallops or murmurs.  Abdomen: Bowel sounds are positive, abdomen soft and non-tender  Msk:  Back normal, normal gait. Normal strength and tone for age. Extremities: No clubbing, cyanosis or edema.   Neuro: Alert and oriented X 3. Psych:  Good affect, responds appropriately   LABS: Basic Metabolic Panel: Recent Labs    11/12/22 0404 11/13/22 0522  NA 134* 135  K 4.7 4.9  CL 98 107  CO2 27 18*  GLUCOSE 131* 155*  BUN 87* 85*  CREATININE 3.93* 3.72*  CALCIUM 8.6* 7.2*   Liver Function Tests: Recent Labs    11/13/22 0522  AST 35  ALT 30  ALKPHOS 103  BILITOT 0.6  PROT 5.2*  ALBUMIN 2.4*   No results for input(s): "LIPASE", "AMYLASE" in the last 72 hours. CBC: Recent Labs    11/12/22 0404 11/13/22 0656 11/14/22 0856  WBC 4.6 9.7 8.1  NEUTROABS 2.6  --   --   HGB 12.3* 10.9* 9.8*  HCT 37.1* 33.1* 29.6*  MCV 98.1 98.5 98.7  PLT 110* 103* 100*   Cardiac Enzymes: No results for input(s): "CKTOTAL", "CKMB", "CKMBINDEX", "TROPONINI" in the last 72 hours. BNP: Invalid input(s): "POCBNP" D-Dimer: No results for input(s): "DDIMER" in the last 72 hours. Hemoglobin A1C: No results for input(s): "HGBA1C"  in the last 72 hours. Fasting Lipid Panel: No results for input(s): "CHOL", "HDL", "LDLCALC", "TRIG", "CHOLHDL", "LDLDIRECT" in the last 72 hours. Thyroid Function Tests: No results for input(s): "TSH", "T4TOTAL", "T3FREE", "THYROIDAB" in the last 72 hours.  Invalid input(s): "FREET3" Anemia Panel: No results for input(s): "VITAMINB12", "FOLATE", "FERRITIN", "TIBC", "IRON", "RETICCTPCT" in the last 72 hours.  No results found.   Echo   TELEMETRY: Normal sinus rhythm first-degree AV block rate around 80 nonspecific ST-T wave changes:  ASSESSMENT AND PLAN:  Principal Problem:   Closed hip fracture requiring operative repair, right, sequela Active Problems:   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)   HLD (hyperlipidemia)   CAD (coronary artery disease)   Chronic hypotension   End-stage renal disease on hemodialysis (HCC)   Atrial fibrillation (HCC)   HFrEF (heart failure with reduced ejection fraction) (HCC)   Transaminitis    Plan Atrial fibrillation controlled continue current therapy on sotalol Status post hip fracture with repair postop day 2 continue current management recommend physical therapy End-stage renal disease on dialysis agree with nephrology input dialysis management  Chronic hypotension continue midodrine to help with blood pressure support Multivessel coronary disease coronary bypass times 09/1999 continue aspirin Plavix  statin therapy Mild systolic cardiomyopathy EF around 45% continue conservative management Hyperlipidemia maintain Crestor therapy for lipid management No invasive cardiac procedures recommended    Alwyn Pea, MD 11/14/2022 5:19 PM

## 2022-11-14 NOTE — Progress Notes (Signed)
Physical Therapy Treatment Patient Details Name: Matthew [name] MRN: [id number] DOB: Sep 28, 1930 Today's Date: 11/14/2022   History of Present Illness Matthew [name] is a 89 y.o. male with medical history significant of multiple medical issues including atrial fibrillation, HFrEF, coronary artery disease, end-stage renal disease on hemodialysis Monday Wednesday Friday, GERD, hypertension, hyperlipidemia, type 2 diabetes presenting with fall and right hip fracture. He is s/p ORIF,11/12/22; He typically gets HD on MWF however due to surgery yesterday will be getting Dialysis today, Saturday 11/13/22.    PT Comments    Pt ready to get up.  Min a x 1 with HOB raised and assist for LE with increased time.  Steady in sitting today.  He is able to stand from elevated bed with mod a +1 but unable to step.  +2 called and he is able to slowly transition to [facility].  Extended time for BM but unable at this time.  Does void.  He stands to transition to recliner but stepping is challenging and BSC is eventually removed and recliner brought behind him as he is unable to step.  Remained up with needs met.   Recommendations for follow up therapy are one component of a multi-disciplinary discharge planning process, led by the attending physician.  Recommendations may be updated based on patient status, additional functional criteria and insurance authorization.  Follow Up Recommendations       Assistance Recommended at Discharge Frequent or constant Supervision/Assistance  Patient can return home with the following Two people to help with walking and/or transfers;A lot of help with bathing/dressing/bathroom;Assistance with cooking/housework;Assist for transportation;Help with stairs or ramp for entrance   Equipment Recommendations  None recommended by PT    Recommendations for Other Services       Precautions / Restrictions Precautions Precautions: Fall Restrictions Weight Bearing Restrictions: Yes RLE  Weight Bearing: Weight bearing as tolerated     Mobility  Bed Mobility Overal bed mobility: Needs Assistance Bed Mobility: Supine to Sit     Supine to sit: Min assist, HOB elevated          Transfers Overall transfer level: Needs assistance Equipment used: Rolling walker (2 wheels) Transfers: Sit to/from Stand Sit to Stand: +2 safety/equipment, Mod assist Stand pivot transfers: Min assist, +2 safety/equipment         General transfer comment: stood +1 from EOB but unable to step.  needs +2 to stand from [facility] and chair due to lower height    Ambulation/Gait Ambulation/Gait assistance: Min assist, +2 physical assistance Gait Distance (Feet): 1 Feet Assistive device: Rolling walker (2 wheels)   Gait velocity: dec     General Gait Details: very poor quality side steps and unable to do any functional gait.   Stairs             Wheelchair Mobility    Modified Rankin (Stroke Patients Only)       Balance Overall balance assessment: Needs assistance Sitting-balance support: Bilateral upper extremity supported, Feet supported Sitting balance-Leahy Scale: Good     Standing balance support: Bilateral upper extremity supported Standing balance-Leahy Scale: Poor Standing balance comment: requires +2 assistance for balance during stand pivot transfer;                            Cognition Arousal/Alertness: Awake/alert Behavior During Therapy: WFL for tasks assessed/performed Overall Cognitive Status: Within Functional Limits for tasks assessed  Exercises Other Exercises Other Exercises: supine and seated A/AAROM x 10    General Comments        Pertinent Vitals/Pain Pain Assessment Pain Assessment: Faces Faces Pain Scale: Hurts even more Pain Location: right hip - no pain at rest Pain Descriptors / Indicators: Aching, Sore Pain Intervention(s): Limited activity within patient's  tolerance, Monitored during session, Repositioned, RN gave pain meds during session    Home Living                          Prior Function            PT Goals (current goals can now be found in the care plan section) Progress towards PT goals: Progressing toward goals    Frequency    BID      PT Plan Current plan remains appropriate    Co-evaluation              AM-PAC PT "6 Clicks" Mobility   Outcome Measure  Help needed turning from your back to your side while in a flat bed without using bedrails?: A Little Help needed moving from lying on your back to sitting on the side of a flat bed without using bedrails?: A Lot Help needed moving to and from a bed to a chair (including a wheelchair)?: A Lot Help needed standing up from a chair using your arms (e.g., wheelchair or bedside chair)?: A Lot Help needed to walk in hospital room?: Total Help needed climbing 3-5 steps with a railing? : Total 6 Click Score: 11    End of Session Equipment Utilized During Treatment: Gait belt Activity Tolerance: Patient tolerated treatment well Patient left: in chair;with call bell/phone within reach;with chair alarm set Nurse Communication: Mobility status PT Visit Diagnosis: Muscle weakness (generalized) (M62.81);History of falling (Z91.81);Difficulty in walking, not elsewhere classified (R26.2);Pain Pain - Right/Left: Right Pain - part of body: Hip     Time: 1610-9604 PT Time Calculation (min) (ACUTE ONLY): 34 min  Charges:  $Therapeutic Exercise: 8-22 mins $Therapeutic Activity: 8-22 mins                   Danielle Dess, PTA 11/14/22, 9:51 AM

## 2022-11-14 NOTE — Progress Notes (Signed)
Progress Note   Patient: Matthew [name] QIO:[id number] DOB: 02-Dec-1930 DOA: 11/12/2022     2 DOS: the patient was seen and examined on 11/14/2022   Brief hospital course: 89 y.o. male with medical history significant of multiple medical issues including atrial fibrillation, HFrEF, coronary artery disease, end-stage renal disease on hemodialysis Monday Wednesday Friday, GERD, hypertension, hyperlipidemia, type 2 diabetes presenting with fall and right hip fracture.  History from patient as well as daughter.  Per the daughter, patient went to get up to use the bathroom.  Slipped and fell on his right hip with secondary hip fracture.  No reported head trauma loss conscious.  No reported weakness or dizziness prior to episode.  Baseline ESRD on hemodialysis Monday Wednesday Friday.  No missed dialysis sessions.  Follows Dr. Welton Flakes outpatient from a cardiology standpoint.  Has had some mild medication changes secondary to elevated liver function.  6/22.  Postoperative day 1 for right intertrochanteric hip fracture with intramedullary fixation.  AST and ALT normalized. 6/23.  Postoperative day 2.  Continue pain control.  Assessment and Plan: * Closed hip fracture requiring operative repair, right, sequela Postoperative day 2.  Patient having soreness in the hip.  PT evaluation appreciated.  Will end up needing rehab.  Increase MiraLAX to twice daily.  End-stage renal disease on hemodialysis [facility]) Dialysis done Saturday since he missed Friday.  Will end up keeping on Monday Wednesday Friday schedule.   Chronic hypotension On midodrine  Atrial fibrillation (HCC) Paroxysmal in nature.  On sotalol.  CAD (coronary artery disease) S/p 5 vessel CABG 2001.  On aspirin and Plavix.     HFrEF (heart failure with reduced ejection fraction) (HCC) 2d ECHO 12/2017 w/ EF 45% Dialysis to manage fluid.  Blood pressure too low for other cardiac meds.  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  (HCC) Last hemoglobin A1c 5.9.  On Glucotrol.  HLD (hyperlipidemia) On crestor  Transaminitis AST and ALT normalized        Subjective: Patient seen this morning.  Felt okay.  Some soreness in his hip.  Offered no other complaints.  Eating well.  No bowel movement as of this morning.  Admitted after hip fracture.  Physical Exam: Vitals:   11/13/22 1843 11/13/22 1848 11/13/22 2153 11/14/22 0744  BP: (!) 102/40  (!) 107/46 (!) 106/46  Pulse: 62  66 64  Resp: 14  20 16   Temp:   98 F (36.7 C) 97.6 F (36.4 C)  TempSrc:      SpO2: 100%  100% 100%  Weight:  92.2 kg    Height:       Physical Exam HENT:     Head: Normocephalic.     Mouth/Throat:     Pharynx: No oropharyngeal exudate.  Eyes:     General: Lids are normal.     Conjunctiva/sclera: Conjunctivae normal.  Cardiovascular:     Rate and Rhythm: Normal rate and regular rhythm.     Heart sounds: Normal heart sounds, S1 normal and S2 normal.  Pulmonary:     Breath sounds: No decreased breath sounds, wheezing, rhonchi or rales.  Abdominal:     Palpations: Abdomen is soft.     Tenderness: There is no abdominal tenderness.  Musculoskeletal:     Right lower leg: No swelling.     Left lower leg: No swelling.  Skin:    General: Skin is warm.     Findings: No rash.  Neurological:     Mental Status:  He is alert and oriented to person, place, and time.     Comments: Able to straight leg raise with left leg.  Unable to straight leg raise with right leg     Data Reviewed: Hemoglobin 9.8, platelet count 100, white blood cell count 8.1  Family Communication: Updated patient's daughter on the phone  Disposition: Status is: Inpatient Remains inpatient appropriate because: Postoperative day 2 for right intertrochanteric hip fracture with intramedullary fixation.  Planned Discharge Destination: Rehab    Time spent: 27 minutes  Author: Alford Highland, MD 11/14/2022 2:31 PM  For on call review [email or web address].

## 2022-11-14 NOTE — TOC Initial Note (Signed)
Transition of Care [facility]) - Initial/Assessment Note    Patient Details  Name: Matthew [name] MRN: [id number] Date of Birth: [birth year]  Transition of Care [facility]) CM/SW Contact:    Kemper Durie, RN Phone Number: 11/14/2022, 9:35 AM  Clinical Narrative:                  Patient admitted from home where he lives with daughter.  Dr. Ellsworth Lennox is PCP, preferred pharmacy is [facility] in [place].  He is on HD 3 days a week, MWF.  Has DME in the home (walker, lift chair, raised commode, rails on bed and in bathroom).  Agrees to recommendation for SNF, but also hoping he improves in order to go home for HHPT.  SNF preference is [facility], second choice is [facility].  FL2 completed, bed search initiated.   Expected Discharge Plan: Skilled Nursing Facility Barriers to Discharge: Continued Medical Work up   Patient Goals and CMS Choice Patient states their goals for this hospitalization and ongoing recovery are:: SNF for short term rehab   Choice offered to / list presented to : Patient      Expected Discharge Plan and Services     Post Acute Care Choice: Skilled Nursing Facility Living arrangements for the past 2 months: Single Family Home                                      Prior Living Arrangements/Services Living arrangements for the past 2 months: Single Family Home Lives with:: Adult Children Patient language and need for interpreter reviewed:: Yes Do you feel safe going back to the place where you live?: Yes      Need for Family Participation in Patient Care: Yes (Comment) Care giver support system in place?: Yes (comment) Current home services: DME Criminal Activity/Legal Involvement Pertinent to Current Situation/Hospitalization: No - Comment as needed  Activities of Daily Living Home Assistive Devices/Equipment: Walker (specify type), Dentures (specify type), Eyeglasses ADL Screening (condition at time of admission) Patient's cognitive ability  adequate to safely complete daily activities?: Yes Is the patient deaf or have difficulty hearing?: No Does the patient have difficulty seeing, even when wearing glasses/contacts?: No Does the patient have difficulty concentrating, remembering, or making decisions?: No Patient able to express need for assistance with ADLs?: Yes Does the patient have difficulty dressing or bathing?: No Independently performs ADLs?: Yes (appropriate for developmental age) Does the patient have difficulty walking or climbing stairs?: No Weakness of Legs: None Weakness of Arms/Hands: None  Permission Sought/Granted   Permission granted to share information with : Yes, Verbal Permission Granted     Permission granted to share info w AGENCY: SNF        Emotional Assessment   Attitude/Demeanor/Rapport: Gracious, Engaged Affect (typically observed): Calm Orientation: : Oriented to Self, Oriented to Place, Oriented to  Time, Oriented to Situation   Psych Involvement: No (comment)  Admission diagnosis:  Hip fracture (HCC) [S72.009A] End-stage renal disease on hemodialysis (HCC) [N18.6, Z99.2] Fall, initial encounter [W19.XXXA] Intertrochanteric fracture of right femur, closed, initial encounter [facility]) [S72.141A] Patient Active Problem List   Diagnosis Date Noted   Closed hip fracture requiring operative repair, right, sequela 11/13/2022   HFrEF (heart failure with reduced ejection fraction) (HCC) 11/12/2022   Transaminitis 11/12/2022   Age-related osteoporosis with current pathological fracture with routine healing 02/18/2021   Pressure injury of skin 02/15/2021   Pelvic  fracture (HCC) 02/11/2021   End-stage renal disease on hemodialysis (HCC)    Chronic hypotension 12/26/2020   Tremor    Hypothyroidism    Chronic systolic CHF (congestive heart failure) (HCC)    Proximal humerus fracture 12/25/2020   Malposition of peritoneal dialysis catheter (HCC) 11/01/2020   Squamous cell carcinoma of scalp  10/2019   Secondary hyperparathyroidism (HCC) 04/30/2018   Sepsis (HCC) 02/15/2018   Cardiomyopathy (HCC) 01/24/2018   Fluid overload 01/10/2018   Climacteric arthritis, lower leg 12/15/2017   Mitral regurgitation 12/15/2017   Hypoglycemia 12/09/2017   Osteoarthritis of knee 06/23/2016   Undiagnosed cardiac murmurs 09/04/2015   Aneurysm of thoracic aorta (HCC) 08/04/2015   Congestive heart failure (HCC) 08/04/2015   Postsurgical aortocoronary bypass status 08/04/2015   Chronic kidney disease 07/18/2015   Aortic root dilatation (HCC) 07/18/2015   Dissecting aortic aneurysm, 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/2015   Edema of both legs 07/18/2015   Shortness of breath 07/18/2015   Atrial fibrillation (HCC) 01/10/2014   Abnormal electrocardiogram 01/10/2014   Encounter for counseling 01/10/2014   Other general symptoms 01/10/2014   CAD (coronary artery disease) 12/04/2013   Unstable angina; Class III Angina 12/03/2013   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) 12/03/2013   HLD (hyperlipidemia) 12/03/2013   Heart disease 12/03/2013   Disorder of kidney and ureter 05/04/2013   Gastrointestinal hemorrhage 05/02/2012   Allergic rhinitis due to pollen 01/06/2007   Reflux esophagitis 01/06/2007   Overweight 10/06/2006   PCP:  Sherron Monday, MD Pharmacy:   [facility] [address], [place] - [address] [place] [address] Phone: [phone] Fax: [phone]  [facility] - [place], [place] - [address] [place] [address] Phone: [phone] Fax: [phone]  [facility] [place], [place] - [address] [place] [address] Phone: [phone] Fax: [phone]     Social Determinants of Health (SDOH) Social History: SDOH Screenings   Food Insecurity: No Food Insecurity (11/12/2022)  Housing: Low Risk  (11/12/2022)  Transportation Needs: No Transportation Needs  (11/12/2022)  Utilities: Not At Risk (11/12/2022)  Depression (PHQ2-9): Low Risk  (09/21/2022)  Financial Resource Strain: Low Risk  (01/10/2018)  Physical Activity: Inactive (01/10/2018)  Social Connections: Socially Integrated (01/10/2018)  Stress: No Stress Concern Present (01/10/2018)  Tobacco Use: Medium Risk (11/12/2022)   SDOH Interventions:     Readmission Risk Interventions    02/12/2021    3:28 PM  Readmission Risk Prevention Plan  Transportation Screening Complete  Medication Review [employer]) Complete  SW Recovery Care/Counseling Consult Complete  Skilled Nursing Facility Complete

## 2022-11-14 NOTE — [place] FL2 (Signed)
[place] MEDICAID FL2 LEVEL OF CARE FORM     IDENTIFICATION  Patient Name: Matthew [name] Birthdate: 07-01-1930 Sex: male Admission Date (Current Location): 11/12/2022  [place] and [place] Number:  Chiropodist and Address:  [facility], [address] [place], [place] [address]      Provider Number: [id number]  Attending Physician Name and Address:  Alford Highland, MD  Relative Name and Phone Number:  Yehuda Savannah (Daughter) [phone] (Mobile)    Current Level of Care: Hospital Recommended Level of Care: Skilled Nursing Facility Prior Approval Number:    Date Approved/Denied:   PASRR Number: [id number]  Discharge Plan: SNF    Current Diagnoses: Patient Active Problem List   Diagnosis Date Noted   Closed hip fracture requiring operative repair, right, sequela 11/13/2022   HFrEF (heart failure with reduced ejection fraction) (HCC) 11/12/2022   Transaminitis 11/12/2022   Age-related osteoporosis with current pathological fracture with routine healing 02/18/2021   Pressure injury of skin 02/15/2021   Pelvic fracture (HCC) 02/11/2021   End-stage renal disease on hemodialysis (HCC)    Chronic hypotension 12/26/2020   Tremor    Hypothyroidism    Chronic systolic CHF (congestive heart failure) (HCC)    Proximal humerus fracture 12/25/2020   Malposition of peritoneal dialysis catheter (HCC) 11/01/2020   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   Secondary hyperparathyroidism (HCC) 04/30/2018   Sepsis (HCC) 02/15/2018   Cardiomyopathy (HCC) 01/24/2018   Fluid overload 01/10/2018   Climacteric arthritis, lower leg 12/15/2017   Mitral regurgitation 12/15/2017   Hypoglycemia 12/09/2017   Osteoarthritis of knee 06/23/2016   Undiagnosed cardiac murmurs 09/04/2015   Aneurysm of thoracic aorta (HCC) 08/04/2015   Congestive heart failure (HCC) 08/04/2015   Postsurgical aortocoronary bypass status 08/04/2015   Chronic kidney disease  07/18/2015   Aortic root dilatation (HCC) 07/18/2015   Dissecting aortic aneurysm, 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/2015   Edema of both legs 07/18/2015   Shortness of breath 07/18/2015   Atrial fibrillation (HCC) 01/10/2014   Abnormal electrocardiogram 01/10/2014   Encounter for counseling 01/10/2014   Other general symptoms 01/10/2014   CAD (coronary artery disease) 12/04/2013   Unstable angina; Class III Angina 12/03/2013   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) 12/03/2013   HLD (hyperlipidemia) 12/03/2013   Heart disease 12/03/2013   Disorder of kidney and ureter 05/04/2013   Gastrointestinal hemorrhage 05/02/2012   Allergic rhinitis due to pollen 01/06/2007   Reflux esophagitis 01/06/2007   Overweight 10/06/2006    Orientation RESPIRATION BLADDER Height & Weight     Self, Time, Situation, Place  Normal Continent Weight: 92.2 kg Height:  6' (182.9 cm)  BEHAVIORAL SYMPTOMS/MOOD NEUROLOGICAL BOWEL NUTRITION STATUS      Continent Diet  AMBULATORY STATUS COMMUNICATION OF NEEDS Skin   Extensive Assist Verbally Surgical wounds                       Personal Care Assistance Level of Assistance  Bathing, Dressing Bathing Assistance: Limited assistance   Dressing Assistance: Limited assistance     Functional Limitations Info             SPECIAL CARE FACTORS FREQUENCY  PT (By licensed PT), OT (By licensed OT) (HD Monday, Wednesday, Friday)     PT Frequency: 5 times a week OT Frequency: 5 times a week            Contractures Contractures Info: Not present    Additional Factors  Info  Code Status, Allergies Code Status Info: DNR Allergies Info: Other High   Other reaction(s): Myalgias (intolerance), Other (See Comments) Statin Drugs Statin Drugs Amoxicillin           Current Medications (11/14/2022):  This is the current hospital active medication list Current Facility-Administered Medications  Medication Dose Route Frequency Provider Last Rate Last  Admin   0.9 %  sodium chloride infusion  250 mL Intravenous PRN Juanell Fairly, MD   New Bag at 11/12/22 1246   acetaminophen (TYLENOL) tablet 325-650 mg  325-650 mg Oral Q6H PRN Juanell Fairly, MD       alteplase (CATHFLO ACTIVASE) injection 2 mg  2 mg Intracatheter Once PRN Juanell Fairly, MD       alum & mag hydroxide-simeth (MAALOX/MYLANTA) 200-200-20 MG/5ML suspension 30 mL  30 mL Oral Q4H PRN Juanell Fairly, MD       anticoagulant sodium citrate solution 5 mL  5 mL Intracatheter PRN Juanell Fairly, MD       aspirin EC tablet 325 mg  325 mg Oral Q breakfast Juanell Fairly, MD   325 mg at 11/14/22 0847   bisacodyl (DULCOLAX) suppository 10 mg  10 mg Rectal Daily PRN Juanell Fairly, MD       Chlorhexidine Gluconate Cloth 2 % PADS 6 each  6 each Topical Q0600 Juanell Fairly, MD   6 each at 11/14/22 0532   clopidogrel (PLAVIX) tablet 75 mg  75 mg Oral Daily Juanell Fairly, MD   75 mg at 11/13/22 0840   docusate sodium (COLACE) capsule 100 mg  100 mg Oral BID Juanell Fairly, MD   100 mg at 11/13/22 2202   feeding supplement (GLUCERNA SHAKE) (GLUCERNA SHAKE) liquid 237 mL  237 mL Oral TID BM Alford Highland, MD   237 mL at 11/13/22 1952   fluticasone (FLONASE) 50 MCG/ACT nasal spray 2 spray  2 spray Each Nare Daily PRN Juanell Fairly, MD       furosemide (LASIX) tablet 40 mg  40 mg Oral Daily Juanell Fairly, MD       glipiZIDE (GLUCOTROL) tablet 2.5 mg  2.5 mg Oral Q1200 Juanell Fairly, MD   2.5 mg at 11/13/22 1216   HYDROcodone-acetaminophen (NORCO/VICODIN) 5-325 MG per tablet 1-2 tablet  1-2 tablet Oral Q4H PRN Juanell Fairly, MD       HYDROmorphone (DILAUDID) injection 1 mg  1 mg Intravenous Q2H PRN Juanell Fairly, MD   1 mg at 11/12/22 0910   insulin aspart (novoLOG) injection 0-6 Units  0-6 Units Subcutaneous TID WC Juanell Fairly, MD   1 Units at 11/13/22 1216   levothyroxine (SYNTHROID) tablet 100 mcg  100 mcg Oral q morning Juanell Fairly, MD   100 mcg at  11/14/22 0520   lidocaine (PF) (XYLOCAINE) 1 % injection 5 mL  5 mL Intradermal PRN Juanell Fairly, MD       lidocaine-prilocaine (EMLA) cream 1 Application  1 Application Topical PRN Juanell Fairly, MD       menthol-cetylpyridinium (CEPACOL) lozenge 3 mg  1 lozenge Oral PRN Juanell Fairly, MD       Or   phenol (CHLORASEPTIC) mouth spray 1 spray  1 spray Mouth/Throat PRN Juanell Fairly, MD       methocarbamol (ROBAXIN) tablet 500 mg  500 mg Oral Q6H PRN Juanell Fairly, MD       Or   methocarbamol (ROBAXIN) 500 mg in dextrose 5 % 50 mL IVPB  500 mg Intravenous Q6H PRN Juanell Fairly, MD  midodrine (PROAMATINE) tablet 5 mg  5 mg Oral TID WC Floydene Flock, MD   5 mg at 11/14/22 0847   morphine (PF) 2 MG/ML injection 0.5-1 mg  0.5-1 mg Intravenous Q2H PRN Juanell Fairly, MD       ondansetron [facility]) tablet 4 mg  4 mg Oral Q6H PRN Juanell Fairly, MD       Or   ondansetron [facility]) injection 4 mg  4 mg Intravenous Q6H PRN Juanell Fairly, MD       pentafluoroprop-tetrafluoroeth (GEBAUERS) aerosol 1 Application  1 Application Topical PRN Juanell Fairly, MD       polyethylene glycol (MIRALAX / GLYCOLAX) packet 17 g  17 g Oral Daily Alford Highland, MD   17 g at 11/13/22 0842   potassium chloride SA (KLOR-CON M) CR tablet 40 mEq  40 mEq Oral Daily Juanell Fairly, MD   40 mEq at 11/13/22 0841   senna (SENOKOT) tablet 8.6 mg  1 tablet Oral BID Juanell Fairly, MD   8.6 mg at 11/13/22 2202   sodium chloride flush (NS) 0.9 % injection 3 mL  3 mL Intravenous Q12H Juanell Fairly, MD   3 mL at 11/13/22 2202   sodium chloride flush (NS) 0.9 % injection 3 mL  3 mL Intravenous PRN Juanell Fairly, MD       sotalol (BETAPACE) tablet 80 mg  80 mg Oral BID Juanell Fairly, MD   80 mg at 11/13/22 2202   traMADol (ULTRAM) tablet 50 mg  50 mg Oral Q6H Juanell Fairly, MD   50 mg at 11/14/22 8657     Discharge Medications: Please see discharge summary for a list of discharge  medications.  Relevant Imaging Results:  Relevant Lab Results:   Additional Information SS#: [id number]. HD MWF  Kemper Durie, RN

## 2022-11-15 DIAGNOSIS — E875 Hyperkalemia: Secondary | ICD-10-CM | POA: Diagnosis not present

## 2022-11-15 DIAGNOSIS — I9589 Other hypotension: Secondary | ICD-10-CM | POA: Diagnosis not present

## 2022-11-15 DIAGNOSIS — N186 End stage renal disease: Secondary | ICD-10-CM | POA: Diagnosis not present

## 2022-11-15 DIAGNOSIS — S72001S Fracture of unspecified part of neck of right femur, sequela: Secondary | ICD-10-CM | POA: Diagnosis not present

## 2022-11-15 LAB — GLUCOSE, CAPILLARY
Glucose-Capillary: 91 mg/dL (ref 70–99)
Glucose-Capillary: 123 mg/dL — ABNORMAL HIGH (ref 70–99)
Glucose-Capillary: 97 mg/dL (ref 70–99)
Glucose-Capillary: 117 mg/dL — ABNORMAL HIGH (ref 70–99)

## 2022-11-15 LAB — RENAL FUNCTION PANEL
Albumin: 2.9 g/dL — ABNORMAL LOW (ref 3.5–5.0)
Anion gap: 11 (ref 5–15)
BUN: 82 mg/dL — ABNORMAL HIGH (ref 8–23)
CO2: 26 mmol/L (ref 22–32)
Calcium: 8.4 mg/dL — ABNORMAL LOW (ref 8.9–10.3)
Chloride: 95 mmol/L — ABNORMAL LOW (ref 98–111)
Creatinine, Ser: 3.72 mg/dL — ABNORMAL HIGH (ref 0.61–1.24)
GFR, Estimated: 15 mL/min — ABNORMAL LOW (ref >60)
Glucose, Bld: 127 mg/dL — ABNORMAL HIGH (ref 70–99)
Phosphorus: 5.8 mg/dL — ABNORMAL HIGH (ref 2.5–4.6)
Potassium: 5.9 mmol/L — ABNORMAL HIGH (ref 3.5–5.1)
Sodium: 132 mmol/L — ABNORMAL LOW (ref 135–145)

## 2022-11-15 MED ORDER — LACTULOSE 10 GM/15ML PO SOLN
20.0000 g | Freq: Once | ORAL | Status: AC
  Administered 2022-11-15: 20 g via ORAL
  Filled 2022-11-15: qty 30

## 2022-11-15 MED ORDER — MIDODRINE HCL 5 MG PO TABS
ORAL_TABLET | ORAL | Status: AC
  Filled 2022-11-15: qty 1

## 2022-11-15 NOTE — Progress Notes (Addendum)
Subjective:  POD #3 s/p medullary fixation for right intertrochanteric hip fracture.   Patient reports right hip pain as mild.  Patient's daughter is at the bedside.  Patient had hemodialysis today.  He has no complaints.  Objective:   VITALS:   Vitals:   11/15/22 1038 11/15/22 1100 11/15/22 1130 11/15/22 1203  BP: (!) 103/44 (!) 100/43 (!) 98/45 (!) 106/38  Pulse:  61 63 (!) 58  Resp:  12 12 13   Temp:    98.4 F (36.9 C)  TempSrc:    Oral  SpO2:  96% 95% 98%  Weight:    96.9 kg  Height:        PHYSICAL EXAM: Right lower extremity Neurovascular intact Sensation intact distally Intact pulses distally Dorsiflexion/Plantar flexion intact Incision: dressing C/D/I No cellulitis present Compartment soft  LABS  Results for orders placed or performed during the hospital encounter of 11/12/22 (from the past 24 hour(s))  Glucose, capillary     Status: Abnormal   Collection Time: 11/14/22  4:45 PM  Result Value Ref Range   Glucose-Capillary 143 (H) 70 - 99 mg/dL  Glucose, capillary     Status: Abnormal   Collection Time: 11/14/22  9:08 PM  Result Value Ref Range   Glucose-Capillary 122 (H) 70 - 99 mg/dL  Glucose, capillary     Status: None   Collection Time: 11/15/22  7:28 AM  Result Value Ref Range   Glucose-Capillary 91 70 - 99 mg/dL  Renal function panel     Status: Abnormal   Collection Time: 11/15/22  8:32 AM  Result Value Ref Range   Sodium 132 (L) 135 - 145 mmol/L   Potassium 5.9 (H) 3.5 - 5.1 mmol/L   Chloride 95 (L) 98 - 111 mmol/L   CO2 26 22 - 32 mmol/L   Glucose, Bld 127 (H) 70 - 99 mg/dL   BUN 82 (H) 8 - 23 mg/dL   Creatinine, Ser 4.09 (H) 0.61 - 1.24 mg/dL   Calcium 8.4 (L) 8.9 - 10.3 mg/dL   Phosphorus 5.8 (H) 2.5 - 4.6 mg/dL   Albumin 2.9 (L) 3.5 - 5.0 g/dL   GFR, Estimated 15 (L) >60 mL/min   Anion gap 11 5 - 15    No results found.  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  Continue physical and Occupational Therapy.  Continue hemodialysis Monday Wednesday Friday.  Patient may be discharged to a skilled nursing facility from an orthopedic standpoint once cleared medically.  The patient will follow-up in at [facility] in [place] in 10 to 14 days after discharge.  Patient will continue Plavix and aspirin for DVT prophylaxis postop if not medically contraindicated.  Patient's daughter has requested that the patient receive physical therapy in the mornings on Monday Wednesday Friday before hemodialysis as he is often very fatigued after hemodialysis and she is worried that he will not get the maximal benefit from therapy in the afternoon after hemodialysis.  I have alerted the care team to this request.    Juanell Fairly , MD 11/15/2022, 1:54 PM

## 2022-11-15 NOTE — Progress Notes (Signed)
Progress Note   Patient: Matthew [name] VWU:[id number] DOB: [birth year] DOA: 11/12/2022     3 DOS: the patient was seen and examined on 11/15/2022   Brief hospital course: 89 y.o. male with medical history significant of multiple medical issues including atrial fibrillation, HFrEF, coronary artery disease, end-stage renal disease on hemodialysis Monday Wednesday Friday, GERD, hypertension, hyperlipidemia, type 2 diabetes presenting with fall and right hip fracture.  History from patient as well as daughter.  Per the daughter, patient went to get up to use the bathroom.  Slipped and fell on his right hip with secondary hip fracture.  No reported head trauma loss conscious.  No reported weakness or dizziness prior to episode.  Baseline ESRD on hemodialysis Monday Wednesday Friday.  No missed dialysis sessions.  Follows Dr. Welton Flakes outpatient from a cardiology standpoint.  Has had some mild medication changes secondary to elevated liver function.  6/22.  Postoperative day 1 for right intertrochanteric hip fracture with intramedullary fixation.  AST and ALT normalized. 6/23.  Postoperative day 2.  Continue pain control. 6/24.  Postoperative day 3.  Patient needs to have a bowel movement.  Assessment and Plan: * Closed hip fracture requiring operative repair, right, sequela Postoperative day 3.  Patient having soreness in the hip.  PT evaluation appreciated.  Will end up needing rehab.  Increased MiraLAX to twice daily.  Dose of lactulose ordered.  End-stage renal disease on hemodialysis [facility]) Dialysis done Saturday since he missed Friday.  Had dialysis this morning   Hyperkalemia Had dialysis today.  Chronic hypotension On midodrine  Atrial fibrillation (HCC) Paroxysmal in nature.  On sotalol.  HFrEF (heart failure with reduced ejection fraction) (HCC) 2d ECHO 12/2017 w/ EF 45% Dialysis to manage fluid.  Blood pressure too low for other cardiac meds.  CAD (coronary artery disease) S/p 5  vessel CABG 2001.  On aspirin and Plavix.     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  On Glucotrol.  HLD (hyperlipidemia) On crestor  Transaminitis AST and ALT normalized        Subjective: Patient seen while on dialysis.  They took him on the early shift today.  Still did not have a bowel movement as of yet.  No bed offers for rehab yet.  Physical Exam: Vitals:   11/15/22 1038 11/15/22 1100 11/15/22 1130 11/15/22 1203  BP: (!) 103/44 (!) 100/43 (!) 98/45 (!) 106/38  Pulse:  61 63 (!) 58  Resp:  12 12 13   Temp:    98.4 F (36.9 C)  TempSrc:    Oral  SpO2:  96% 95% 98%  Weight:    96.9 kg  Height:       Physical Exam HENT:     Head: Normocephalic.     Mouth/Throat:     Pharynx: No oropharyngeal exudate.  Eyes:     General: Lids are normal.     Conjunctiva/sclera: Conjunctivae normal.  Cardiovascular:     Rate and Rhythm: Normal rate and regular rhythm.     Heart sounds: Normal heart sounds, S1 normal and S2 normal.  Pulmonary:     Breath sounds: No decreased breath sounds, wheezing, rhonchi or rales.  Abdominal:     Palpations: Abdomen is soft.     Tenderness: There is no abdominal tenderness.  Musculoskeletal:     Right lower leg: No swelling.     Left lower leg: No swelling.  Skin:    General: Skin is warm.  Findings: No rash.  Neurological:     Mental Status: He is alert and oriented to person, place, and time.     Comments: Able to straight leg raise with left leg.  Barely able to straight leg raise with right leg     Data Reviewed: Potassium 5.9.  Family Communication: Spoke with daughter on the phone  Disposition: Status is: Inpatient Remains inpatient appropriate because: Patient needs to have a bowel movement postoperatively.  Patient needs a rehab bed.  Planned Discharge Destination: Rehab    Time spent: 28 minutes Case discussed with nephrology and orthopedics.  Author: Alford Highland,  MD 11/15/2022 2:28 PM  For on call review [email or web address].

## 2022-11-15 NOTE — Progress Notes (Signed)
Physical Therapy Treatment Patient Details Name: Matthew [name] MRN: [id number] DOB: 1931/02/09 Today's Date: 11/15/2022   History of Present Illness Matthew [name] is a 89 y.o. male with medical history significant of multiple medical issues including atrial fibrillation, HFrEF, coronary artery disease, end-stage renal disease on hemodialysis Monday Wednesday Friday, GERD, hypertension, hyperlipidemia, type 2 diabetes presenting with fall and right hip fracture. He is s/p ORIF,11/12/22; He typically gets HD on MWF however due to surgery yesterday will be getting Dialysis today, Saturday 11/13/22.    PT Comments    Pt unavailable this AM due to dialysis.  He is ready for session this PM.  Supine and seated A/AAROM BLE.  He is able to get to EOB with time and min a x 1 and bed features.  Steady in sitting.  Stands to RW with mod a x 2 and is able to take improved steps to recliner with min a x 2 with increased time.  While he generally tolerates well, he is fatigued but does opt to remain up in chair.    Discussed with MD and daughter.  Daughter concerned about his ability to tolerate PT in pm on dialysis days.  Explained that he was off unit this AM before we were on the unit.  We do have a couple therapists that do arrive earlier in the AM and that we would try to schedule him with them so he could be seen prior to going to dialysis if able.  Pt did do generally well this PM after dialysis with less assist and an expected progression despite dialysis this AM.   Recommendations for follow up therapy are one component of a multi-disciplinary discharge planning process, led by the attending physician.  Recommendations may be updated based on patient status, additional functional criteria and insurance authorization.  Follow Up Recommendations       Assistance Recommended at Discharge Frequent or constant Supervision/Assistance  Patient can return home with the following Two people to help with  walking and/or transfers;A lot of help with bathing/dressing/bathroom;Assistance with cooking/housework;Assist for transportation;Help with stairs or ramp for entrance   Equipment Recommendations  None recommended by PT    Recommendations for Other Services       Precautions / Restrictions Precautions Precautions: Fall Restrictions Weight Bearing Restrictions: Yes RLE Weight Bearing: Weight bearing as tolerated     Mobility  Bed Mobility Overal bed mobility: Needs Assistance Bed Mobility: Supine to Sit     Supine to sit: Min assist, HOB elevated       Patient Response: Cooperative  Transfers Overall transfer level: Needs assistance Equipment used: Rolling walker (2 wheels)   Sit to Stand: +2 safety/equipment, Mod assist Stand pivot transfers: Min assist, +2 safety/equipment         General transfer comment: improved steps today    Ambulation/Gait Ambulation/Gait assistance: Min assist, +2 physical assistance Gait Distance (Feet): 2 Feet Assistive device: Rolling walker (2 wheels) Gait Pattern/deviations: Step-to pattern Gait velocity: dec     General Gait Details: improved steps today to chair   Stairs             Wheelchair Mobility    Modified Rankin (Stroke Patients Only)       Balance Overall balance assessment: Needs assistance Sitting-balance support: Bilateral upper extremity supported, Feet supported Sitting balance-Leahy Scale: Good     Standing balance support: Bilateral upper extremity supported Standing balance-Leahy Scale: Fair Standing balance comment: +2 but generally less assist today and better standing  quality                            Cognition Arousal/Alertness: Awake/alert Behavior During Therapy: WFL for tasks assessed/performed Overall Cognitive Status: Within Functional Limits for tasks assessed                                          Exercises Other Exercises Other Exercises:  supine and seated A/AAROM x 10    General Comments        Pertinent Vitals/Pain Pain Assessment Pain Assessment: Faces Faces Pain Scale: Hurts little more Pain Location: right hip - no pain at rest Pain Descriptors / Indicators: Aching, Sore Pain Intervention(s): RN gave pain meds during session, Repositioned, Limited activity within patient's tolerance    Home Living                          Prior Function            PT Goals (current goals can now be found in the care plan section) Progress towards PT goals: Progressing toward goals    Frequency    BID      PT Plan Current plan remains appropriate    Co-evaluation              AM-PAC PT "6 Clicks" Mobility   Outcome Measure  Help needed turning from your back to your side while in a flat bed without using bedrails?: A Little Help needed moving from lying on your back to sitting on the side of a flat bed without using bedrails?: A Lot Help needed moving to and from a bed to a chair (including a wheelchair)?: A Lot Help needed standing up from a chair using your arms (e.g., wheelchair or bedside chair)?: A Lot Help needed to walk in hospital room?: A Lot Help needed climbing 3-5 steps with a railing? : Total 6 Click Score: 12    End of Session Equipment Utilized During Treatment: Gait belt Activity Tolerance: Patient tolerated treatment well Patient left: in chair;with call bell/phone within reach;with chair alarm set;with family/visitor present Nurse Communication: Mobility status PT Visit Diagnosis: Muscle weakness (generalized) (M62.81);History of falling (Z91.81);Difficulty in walking, not elsewhere classified (R26.2);Pain Pain - Right/Left: Right Pain - part of body: Hip     Time: 1027-2536 PT Time Calculation (min) (ACUTE ONLY): 22 min  Charges:  $Therapeutic Exercise: 8-22 mins                   Danielle Dess, PTA 11/15/22, 2:55 PM

## 2022-11-15 NOTE — Progress Notes (Signed)
Hemodialysis note  Received patient in bed to unit. Alert and oriented.  Informed consent signed and in chart.  Treatment initiated: 0830 Treatment completed: 1203  Patient tolerated well. Transported back to room, alert without acute distress.  Report given to patient's RN.   Access used: LUA AVF Access issues: none  Total UF removed: 0 Medication(s) given:  Midodrine 5 mg tab PO Post HD weight: 96.9 kg  Wolfgang Phoenix [facility]

## 2022-11-15 NOTE — Progress Notes (Signed)
OT Cancellation Note  Patient Details Name: Matthew [name] MRN: [id number] DOB: [birth year]   Cancelled Treatment:    Reason Eval/Treat Not Completed: Patient at procedure or test/ unavailable. Pt at dialysis, unavailable for OT at this time. Will re-attempt at later date/time as appropriate.   Arman Filter., MPH, MS, OTR/L ascom [phone] 11/15/22, 8:13 AM

## 2022-11-15 NOTE — Progress Notes (Signed)
[facility]  ROUNDING NOTE   Subjective:   Matthew [name] is an 89 year old male with medical concerns including CAD status post CABG, hypothyroidism, hypertension, CHF, type 2 diabetes, and end-stage renal disease on hemodialysis. He presents to the ED after suffering a fall and rotation and shortened right leg. He has been admitted for Hip fracture [facility]) [S72.009A] End-stage renal disease on hemodialysis (HCC) [N18.6, Z99.2] Fall, initial encounter [W19.XXXA] Intertrochanteric fracture of right femur, closed, initial encounter (HCC) [S72.141A]  Patient seen and evaluated during dialysis   HEMODIALYSIS FLOWSHEET:  Blood Flow Rate (mL/min): 350 mL/min Arterial Pressure (mmHg): -160 mmHg Venous Pressure (mmHg): 160 mmHg TMP (mmHg): 0 mmHg Ultrafiltration Rate (mL/min): 0 mL/min Dialysate Flow Rate (mL/min): 300 ml/min Bolus Amount (mL): 100 mL  No complaints to offer  Objective:  Vital signs in last 24 hours:  Temp:  [97.4 F (36.3 C)-98.6 F (37 C)] 98.6 F (37 C) (06/24 0809) Pulse Rate:  [57-87] 61 (06/24 1100) Resp:  [12-17] 12 (06/24 1100) BP: (66-117)/(37-48) 100/43 (06/24 1100) SpO2:  [93 %-100 %] 96 % (06/24 1100) Weight:  [96.9 kg] 96.9 kg (06/24 0809)  Weight change:  Filed Weights   11/13/22 1443 11/13/22 1848 11/15/22 0809  Weight: 92.5 kg 92.2 kg 96.9 kg    Intake/Output: I/O last 3 completed shifts: In: 237 [Other:237] Out: 575 [Urine:575]   Intake/Output this shift:  Total I/O In: 120 [P.O.:120] Out: 100 [Urine:100]  Physical Exam: General: NAD  Head: Normocephalic, atraumatic. Moist oral mucosal membranes  Eyes: Anicteric  Lungs:  Clear to auscultation  Heart: Regular rate and rhythm  Abdomen:  Soft, nontender  Extremities:  No peripheral edema, right hip with clean dressings.    Neurologic: Nonfocal, moving all four extremities  Skin: No lesions  Access: Left AVF    Basic Metabolic Panel: Recent Labs  Lab 11/12/22 0404  11/13/22 0522 11/15/22 0832  NA 134* 135 132*  K 4.7 4.9 5.9*  CL 98 107 95*  CO2 27 18* 26  GLUCOSE 131* 155* 127*  BUN 87* 85* 82*  CREATININE 3.93* 3.72* 3.72*  CALCIUM 8.6* 7.2* 8.4*  PHOS  --   --  5.8*     Liver Function Tests: Recent Labs  Lab 11/13/22 0522 11/15/22 0832  AST 35  --   ALT 30  --   ALKPHOS 103  --   BILITOT 0.6  --   PROT 5.2*  --   ALBUMIN 2.4* 2.9*    No results for input(s): "LIPASE", "AMYLASE" in the last 168 hours. No results for input(s): "AMMONIA" in the last 168 hours.  CBC: Recent Labs  Lab 11/12/22 0404 11/13/22 0656 11/14/22 0856  WBC 4.6 9.7 8.1  NEUTROABS 2.6  --   --   HGB 12.3* 10.9* 9.8*  HCT 37.1* 33.1* 29.6*  MCV 98.1 98.5 98.7  PLT 110* 103* 100*     Cardiac Enzymes: No results for input(s): "CKTOTAL", "CKMB", "CKMBINDEX", "TROPONINI" in the last 168 hours.  BNP: Invalid input(s): "POCBNP"  CBG: Recent Labs  Lab 11/14/22 0800 11/14/22 1125 11/14/22 1645 11/14/22 2108 11/15/22 0728  GLUCAP 108* 122* 143* 122* 91     Microbiology: Results for orders placed or performed in visit on 10/15/22  Culture, Urine     Status: Abnormal   Collection Time: 10/15/22  2:05 PM   Specimen: Urine   UC  Result Value Ref Range Status   Urine Culture, Routine Final report (A)  Final   Organism ID,  Bacteria Escherichia coli (A)  Final    Comment: Cefazolin <=4 ug/mL Cefazolin with an MIC <=16 predicts susceptibility to the oral agents cefaclor, cefdinir, cefpodoxime, cefprozil, cefuroxime, cephalexin, and loracarbef when used for therapy of uncomplicated urinary tract infections due to E. coli, Klebsiella pneumoniae, and Proteus mirabilis. Greater than 100,000 colony forming units per mL    Antimicrobial Susceptibility Comment  Final    Comment:       ** S = Susceptible; I = Intermediate; R = Resistant **                    P = Positive; N = Negative             MICS are expressed in micrograms per mL     Antibiotic                 RSLT#1    RSLT#2    RSLT#3    RSLT#4 Amoxicillin/Clavulanic Acid    S Ampicillin                     S Cefepime                       S Ceftriaxone                    S Cefuroxime                     S Ciprofloxacin                  S Ertapenem                      S Gentamicin                     S Imipenem                       S Levofloxacin                   S Meropenem                      S Nitrofurantoin                 S Piperacillin/Tazobactam        S Tetracycline                   S Tobramycin                     S Trimethoprim/Sulfa             S     Coagulation Studies: No results for input(s): "LABPROT", "INR" in the last 72 hours.   Urinalysis: No results for input(s): "COLORURINE", "LABSPEC", "PHURINE", "GLUCOSEU", "HGBUR", "BILIRUBINUR", "KETONESUR", "PROTEINUR", "UROBILINOGEN", "NITRITE", "LEUKOCYTESUR" in the last 72 hours.  Invalid input(s): "APPERANCEUR"    Imaging: No results found.   Medications:    anticoagulant sodium citrate     methocarbamol (ROBAXIN) IV      aspirin EC  325 mg Oral Q breakfast   Chlorhexidine Gluconate Cloth  6 each Topical Q0600   clopidogrel  75 mg Oral Daily   docusate sodium  100 mg Oral BID   feeding supplement (GLUCERNA SHAKE)  237 mL Oral TID BM   furosemide  40 mg Oral  Daily   glipiZIDE  2.5 mg Oral Q1200   insulin aspart  0-6 Units Subcutaneous TID WC   levothyroxine  100 mcg Oral q morning   midodrine  5 mg Oral TID WC   polyethylene glycol  17 g Oral BID   potassium chloride SA  40 mEq Oral Daily   senna  1 tablet Oral BID   sotalol  80 mg Oral BID   traMADol  50 mg Oral Q6H   acetaminophen, alteplase, alum & mag hydroxide-simeth, anticoagulant sodium citrate, bisacodyl, fluticasone, HYDROcodone-acetaminophen, HYDROmorphone (DILAUDID) injection, lidocaine (PF), lidocaine-prilocaine, menthol-cetylpyridinium **OR** phenol, methocarbamol **OR** methocarbamol (ROBAXIN) IV, morphine  injection, ondansetron **OR** ondansetron (ZOFRAN) IV, pentafluoroprop-tetrafluoroeth  Assessment/ Plan:  [name] is a 89 y.o.  male with end stage renal disease on hemodialysis, coronary artery disease status post CABG, hypotension, hypothyroidism, congestive heart failure, diabetes mellitus type II who is admitted to [facility] on 11/12/2022 for Hip fracture (HCC) [S72.009A] End-stage renal disease on hemodialysis (HCC) [N18.6, Z99.2] Fall, initial encounter [W19.XXXA] Intertrochanteric fracture of right femur, closed, initial encounter (HCC) [S72.141A]  Underwent right hip surgery by Dr. Martha Clan on 6/21.   [facility] MWF [address] RIJ permcath 98.5kg   End stage renal disease on hemodialysis.  Receiving dialysis treatment today, UF 1.5 L as tolerated.  Patient currently hypotensive despite midodrine, goal reduced.  Next treatment scheduled for Wednesday.  Due to hip surgery, rehab placement if possible at discharge.  Will monitor discharge planning and evaluate outpatient dialysis needs.  2. Anemia of chronic kidney disease Lab Results  Component Value Date   HGB 9.8 (L) 11/14/2022    Patient receives Mircera at outpatient clinic.  Hemoglobin within desired range.  3. Secondary Hyperparathyroidism: with hip fracture.   Lab Results  Component Value Date   CALCIUM 8.4 (L) 11/15/2022   CAION 1.16 04/15/2021   PHOS 5.8 (H) 11/15/2022  Calcium acceptable however phosphorus slightly elevated. Previously on calcium carbonate with meals.   4.  Hypotension: midodrine before dialysis treatments. On metoprolol and furosemide on nondialysis days.  Blood pressure currently 98/45 during dialysis.  Patient did receive midodrine prior to treatment.   LOS: 3   6/24/202411:46 AM

## 2022-11-15 NOTE — Plan of Care (Signed)
  Problem: Education: Goal: Knowledge of General Education information will improve Description: Including pain rating scale, medication(s)/side effects and non-pharmacologic comfort measures Outcome: Progressing   Problem: Health Behavior/Discharge Planning: Goal: Ability to manage health-related needs will improve Outcome: Progressing   Problem: Clinical Measurements: Goal: Ability to maintain clinical measurements within normal limits will improve Outcome: Progressing Goal: Will remain free from infection Outcome: Progressing Goal: Diagnostic test results will improve Outcome: Progressing Goal: Respiratory complications will improve Outcome: Progressing Goal: Cardiovascular complication will be avoided Outcome: Progressing   Problem: Activity: Goal: Risk for activity intolerance will decrease Outcome: Progressing   Problem: Nutrition: Goal: Adequate nutrition will be maintained Outcome: Progressing   Problem: Coping: Goal: Level of anxiety will decrease Outcome: Progressing   Problem: Elimination: Goal: Will not experience complications related to bowel motility Outcome: Progressing   Problem: Pain Managment: Goal: General experience of comfort will improve Outcome: Progressing   Problem: Safety: Goal: Ability to remain free from injury will improve Outcome: Progressing   Problem: Skin Integrity: Goal: Risk for impaired skin integrity will decrease Outcome: Progressing   Problem: Education: Goal: Verbalization of understanding the information provided (i.e., activity precautions, restrictions, etc) will improve Outcome: Progressing   Problem: Activity: Goal: Ability to ambulate and perform ADLs will improve Outcome: Progressing   Problem: Clinical Measurements: Goal: Postoperative complications will be avoided or minimized Outcome: Progressing   Problem: Self-Concept: Goal: Ability to maintain and perform role responsibilities to the fullest extent  possible will improve Outcome: Progressing   Problem: Pain Management: Goal: Pain level will decrease Outcome: Progressing

## 2022-11-15 NOTE — Assessment & Plan Note (Signed)
Potassium 5.7 this morning.  Given Lokelma, insulin, D50, sodium bicarb and calcium.

## 2022-11-16 DIAGNOSIS — S72001S Fracture of unspecified part of neck of right femur, sequela: Secondary | ICD-10-CM | POA: Diagnosis not present

## 2022-11-16 DIAGNOSIS — I9589 Other hypotension: Secondary | ICD-10-CM | POA: Diagnosis not present

## 2022-11-16 DIAGNOSIS — E875 Hyperkalemia: Secondary | ICD-10-CM | POA: Diagnosis not present

## 2022-11-16 DIAGNOSIS — I480 Paroxysmal atrial fibrillation: Secondary | ICD-10-CM | POA: Diagnosis not present

## 2022-11-16 DIAGNOSIS — R710 Precipitous drop in hematocrit: Secondary | ICD-10-CM

## 2022-11-16 LAB — GLUCOSE, CAPILLARY
Glucose-Capillary: 92 mg/dL (ref 70–99)
Glucose-Capillary: 114 mg/dL — ABNORMAL HIGH (ref 70–99)
Glucose-Capillary: 122 mg/dL — ABNORMAL HIGH (ref 70–99)
Glucose-Capillary: 138 mg/dL — ABNORMAL HIGH (ref 70–99)

## 2022-11-16 LAB — HEMOGLOBIN: Hemoglobin: 9 g/dL — ABNORMAL LOW (ref 13.0–17.0)

## 2022-11-16 LAB — POTASSIUM
Potassium: 5.7 mmol/L — ABNORMAL HIGH (ref 3.5–5.1)
Potassium: 6.5 mmol/L (ref 3.5–5.1)

## 2022-11-16 MED ORDER — CALCIUM GLUCONATE-NACL 1-0.675 GM/50ML-% IV SOLN
1.0000 g | Freq: Once | INTRAVENOUS | Status: AC
  Administered 2022-11-16: 1000 mg via INTRAVENOUS
  Filled 2022-11-16: qty 50

## 2022-11-16 MED ORDER — DEXTROSE 50 % IV SOLN
25.0000 g | Freq: Once | INTRAVENOUS | Status: AC
  Administered 2022-11-16: 25 g via INTRAVENOUS
  Filled 2022-11-16: qty 50

## 2022-11-16 MED ORDER — LACTULOSE 10 GM/15ML PO SOLN
30.0000 g | Freq: Two times a day (BID) | ORAL | Status: DC
  Administered 2022-11-16 – 2022-11-18 (×2): 30 g via ORAL
  Filled 2022-11-16 (×3): qty 60

## 2022-11-16 MED ORDER — NEPRO/CARBSTEADY PO LIQD
237.0000 mL | Freq: Three times a day (TID) | ORAL | Status: DC
  Administered 2022-11-16 – 2022-11-19 (×7): 237 mL via ORAL

## 2022-11-16 MED ORDER — SODIUM ZIRCONIUM CYCLOSILICATE 10 G PO PACK
10.0000 g | PACK | Freq: Once | ORAL | Status: AC
  Administered 2022-11-16: 10 g via ORAL
  Filled 2022-11-16: qty 1

## 2022-11-16 MED ORDER — FERROUS SULFATE 325 (65 FE) MG PO TABS
325.0000 mg | ORAL_TABLET | Freq: Every day | ORAL | Status: DC
  Administered 2022-11-17 – 2022-11-18 (×2): 325 mg via ORAL
  Filled 2022-11-16 (×2): qty 1

## 2022-11-16 MED ORDER — INSULIN ASPART 100 UNIT/ML IV SOLN
10.0000 [IU] | Freq: Once | INTRAVENOUS | Status: AC
  Administered 2022-11-16: 10 [IU] via INTRAVENOUS
  Filled 2022-11-16: qty 0.1

## 2022-11-16 MED ORDER — BISACODYL 5 MG PO TBEC
10.0000 mg | DELAYED_RELEASE_TABLET | Freq: Once | ORAL | Status: AC
  Administered 2022-11-16: 10 mg via ORAL
  Filled 2022-11-16: qty 2

## 2022-11-16 MED ORDER — FLEET ENEMA 7-19 GM/118ML RE ENEM: 1.0000 | ENEMA | Freq: Every day | RECTAL | Status: DC | PRN

## 2022-11-16 MED ORDER — SODIUM BICARBONATE 8.4 % IV SOLN
50.0000 meq | Freq: Once | INTRAVENOUS | Status: AC
  Administered 2022-11-16: 50 meq via INTRAVENOUS
  Filled 2022-11-16: qty 50

## 2022-11-16 NOTE — Progress Notes (Signed)
  Subjective:  POD #4 s/p intramedullary fixation for right intertrochanteric hip fracture.   Patient reports right hip pain as mild.  Patient with hyperkalemia today and is receiving bicarb infusion by his RN.  Male family members at the bedside.  The patient had a bowel movement.  Objective:   VITALS:   Vitals:   11/16/22 1200 11/16/22 1202 11/16/22 1606 11/16/22 1607  BP: (!) 111/45 (!) 111/45 (!) 96/47 (!) 96/47  Pulse: 62 60 62 62  Resp: 18  17 17   Temp:   97.7 F (36.5 C) 97.7 F (36.5 C)  TempSrc:      SpO2: 100% 100% 97% 100%  Weight:      Height:        PHYSICAL EXAM: Right lower extremity Neurovascular intact Sensation intact distally Intact pulses distally Dorsiflexion/Plantar flexion intact Incision: scant drainage No cellulitis present Compartment soft  LABS  Results for orders placed or performed during the hospital encounter of 11/12/22 (from the past 24 hour(s))  Glucose, capillary     Status: None   Collection Time: 11/15/22  5:53 PM  Result Value Ref Range   Glucose-Capillary 97 70 - 99 mg/dL  Glucose, capillary     Status: Abnormal   Collection Time: 11/15/22  9:54 PM  Result Value Ref Range   Glucose-Capillary 117 (H) 70 - 99 mg/dL  Hemoglobin     Status: Abnormal   Collection Time: 11/16/22  4:50 AM  Result Value Ref Range   Hemoglobin 9.0 (L) 13.0 - 17.0 g/dL  Potassium     Status: Abnormal   Collection Time: 11/16/22  4:50 AM  Result Value Ref Range   Potassium 5.7 (H) 3.5 - 5.1 mmol/L  Glucose, capillary     Status: None   Collection Time: 11/16/22  7:49 AM  Result Value Ref Range   Glucose-Capillary 92 70 - 99 mg/dL  Glucose, capillary     Status: Abnormal   Collection Time: 11/16/22 11:30 AM  Result Value Ref Range   Glucose-Capillary 114 (H) 70 - 99 mg/dL  Potassium     Status: Abnormal   Collection Time: 11/16/22  1:00 PM  Result Value Ref Range   Potassium 6.5 (HH) 3.5 - 5.1 mmol/L    No results found.  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  He states he got up a couple times with physical therapy today.  Patient will continue with physical therapy.  He will keep hemodialysis tomorrow.  He is currently being treated for hyperkalemia.  Patient will need a skilled nursing facility upon discharge.  Continue Plavix and aspirin.    Juanell Fairly , MD 11/16/2022, 5:17 PM

## 2022-11-16 NOTE — Progress Notes (Signed)
Physical Therapy Treatment Patient Details Name: Matthew [name] MRN: [id number] DOB: 1931/02/13 Today's Date: 11/16/2022   History of Present Illness Matthew [name] is a 89 y.o. male with medical history significant of multiple medical issues including atrial fibrillation, HFrEF, coronary artery disease, end-stage renal disease on hemodialysis Monday Wednesday Friday, GERD, hypertension, hyperlipidemia, type 2 diabetes presenting with fall and right hip fracture. He is s/p ORIF,11/12/22; He typically gets HD on MWF however due to surgery yesterday will be getting Dialysis today, Saturday 11/13/22.    PT Comments    Pt ready for second attempt.  Stood with mod a x 2.  He struggles to step forward and backward despite increased assist.  He is able to stand and do AROM RLE x 10.  Seated AROM.  Pt fatigued with activity and declined further at this time.   Recommendations for follow up therapy are one component of a multi-disciplinary discharge planning process, led by the attending physician.  Recommendations may be updated based on patient status, additional functional criteria and insurance authorization.  Follow Up Recommendations       Assistance Recommended at Discharge Frequent or constant Supervision/Assistance  Patient can return home with the following Two people to help with walking and/or transfers;A lot of help with bathing/dressing/bathroom;Assistance with cooking/housework;Assist for transportation;Help with stairs or ramp for entrance   Equipment Recommendations  None recommended by PT    Recommendations for Other Services       Precautions / Restrictions Precautions Precautions: Fall Restrictions Weight Bearing Restrictions: Yes RLE Weight Bearing: Weight bearing as tolerated     Mobility  Bed Mobility Overal bed mobility: Needs Assistance Bed Mobility: Supine to Sit     Supine to sit: Min assist, HOB elevated       Patient Response:  Cooperative  Transfers Overall transfer level: Needs assistance Equipment used: Rolling walker (2 wheels) Transfers: Sit to/from Stand Sit to Stand: Mod assist, +2 physical assistance   Step pivot transfers: Mod assist       General transfer comment: improved steps today    Ambulation/Gait Ambulation/Gait assistance: Mod assist, +2 physical assistance Gait Distance (Feet): 2 Feet Assistive device: Rolling walker (2 wheels) Gait Pattern/deviations: Step-to pattern Gait velocity: dec     General Gait Details: struggled stepping today   Stairs             Wheelchair Mobility    Modified Rankin (Stroke Patients Only)       Balance Overall balance assessment: Needs assistance Sitting-balance support: Bilateral upper extremity supported, Feet supported Sitting balance-Leahy Scale: Good     Standing balance support: Bilateral upper extremity supported Standing balance-Leahy Scale: Poor Standing balance comment: attempted with +1 but he still should have +2 today                            Cognition Arousal/Alertness: Awake/alert Behavior During Therapy: WFL for tasks assessed/performed Overall Cognitive Status: Within Functional Limits for tasks assessed                                          Exercises Other Exercises Other Exercises: seated and standing AROM    General Comments        Pertinent Vitals/Pain Pain Assessment Pain Assessment: Faces Faces Pain Scale: Hurts little more Pain Location: right hip - no pain at rest Pain  Descriptors / Indicators: Aching, Sore Pain Intervention(s): Limited activity within patient's tolerance, Monitored during session, Repositioned    Home Living                          Prior Function            PT Goals (current goals can now be found in the care plan section) Progress towards PT goals: Progressing toward goals    Frequency    BID      PT Plan Current  plan remains appropriate    Co-evaluation              AM-PAC PT "6 Clicks" Mobility   Outcome Measure  Help needed turning from your back to your side while in a flat bed without using bedrails?: A Little Help needed moving from lying on your back to sitting on the side of a flat bed without using bedrails?: A Little Help needed moving to and from a bed to a chair (including a wheelchair)?: A Lot Help needed standing up from a chair using your arms (e.g., wheelchair or bedside chair)?: A Lot Help needed to walk in hospital room?: A Lot Help needed climbing 3-5 steps with a railing? : Total 6 Click Score: 13    End of Session Equipment Utilized During Treatment: Gait belt Activity Tolerance: Patient tolerated treatment well Patient left: in chair;with call bell/phone within reach;with chair alarm set;with family/visitor present Nurse Communication: Mobility status PT Visit Diagnosis: Muscle weakness (generalized) (M62.81);History of falling (Z91.81);Difficulty in walking, not elsewhere classified (R26.2);Pain Pain - Right/Left: Right Pain - part of body: Hip     Time: 0948-1000 PT Time Calculation (min) (ACUTE ONLY): 12 min  Charges:  $Gait Training: 8-22 mins $Therapeutic Exercise: 8-22 mins                   Danielle Dess, PTA 11/16/22, 10:13 AM

## 2022-11-16 NOTE — Progress Notes (Signed)
Progress Note   Patient: Matthew [name] XBJ:[id number] DOB: [birth year] DOA: 11/12/2022     4 DOS: the patient was seen and examined on 11/16/2022   Brief hospital course: 89 y.o. male with medical history significant of multiple medical issues including atrial fibrillation, HFrEF, coronary artery disease, end-stage renal disease on hemodialysis Monday Wednesday Friday, GERD, hypertension, hyperlipidemia, type 2 diabetes presenting with fall and right hip fracture.  History from patient as well as daughter.  Per the daughter, patient went to get up to use the bathroom.  Slipped and fell on his right hip with secondary hip fracture.  No reported head trauma loss conscious.  No reported weakness or dizziness prior to episode.  Baseline ESRD on hemodialysis Monday Wednesday Friday.  No missed dialysis sessions.  Follows Dr. Welton Flakes outpatient from a cardiology standpoint.  Has had some mild medication changes secondary to elevated liver function.  6/22.  Postoperative day 1 for right intertrochanteric hip fracture with intramedullary fixation.  AST and ALT normalized. 6/23.  Postoperative day 2.  Continue pain control. 6/24.  Postoperative day 3.  Patient needs to have a bowel movement. 6/25.  Postoperative day 4.  Patient still has not had a bowel movement despite medications prescribed.  Treated hyperkalemia this morning.  Assessment and Plan: * Closed hip fracture requiring operative repair, right, sequela Postoperative day 4.  Patient having soreness in the hip.  PT evaluation appreciated.  Will end up needing rehab.  Increased MiraLAX to twice daily.  Lactulose and Dulcolax ordered.  End-stage renal disease on hemodialysis [facility]) Dialysis done Saturday since he missed Friday.  Had dialysis Monday and next will be on Wednesday.   Hyperkalemia Potassium 5.7 this morning.  Given Lokelma, insulin, D50, sodium bicarb and calcium.  Chronic hypotension On midodrine  Atrial fibrillation  (HCC) Paroxysmal in nature.  On sotalol.  HFrEF (heart failure with reduced ejection fraction) (HCC) 2d ECHO 12/2017 w/ EF 45% Dialysis to manage fluid.  Blood pressure too low for other cardiac meds.  CAD (coronary artery disease) S/p 5 vessel CABG 2001.  On aspirin and Plavix.     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  On Glucotrol.  HLD (hyperlipidemia) On crestor  Transaminitis AST and ALT normalized  Drop in hemoglobin Hemoglobin came down from 12.3 on admission down to 9.0.  Continue to monitor.  Likely was a little dehydrated upon coming in.        Subjective: Patient feeling okay.  Offers no complaints except for some soreness in his right hip.  Admitted after hip fracture.  Physical Exam: Vitals:   11/15/22 1743 11/15/22 2344 11/16/22 0747 11/16/22 1200  BP: (!) 104/53 (!) 122/50 (!) 98/53 (!) 111/45  Pulse: 80 63 60 62  Resp: 16 16 16 18   Temp: 98 F (36.7 C) 97.9 F (36.6 C) (!) 97.5 F (36.4 C)   TempSrc:      SpO2: (!) 85% 98% 96% 100%  Weight:      Height:       Physical Exam HENT:     Head: Normocephalic.     Mouth/Throat:     Pharynx: No oropharyngeal exudate.  Eyes:     General: Lids are normal.     Conjunctiva/sclera: Conjunctivae normal.  Cardiovascular:     Rate and Rhythm: Normal rate and regular rhythm.     Heart sounds: Normal heart sounds, S1 normal and S2 normal.  Pulmonary:     Breath  sounds: No decreased breath sounds, wheezing, rhonchi or rales.  Abdominal:     Palpations: Abdomen is soft.     Tenderness: There is no abdominal tenderness.  Musculoskeletal:     Right lower leg: No swelling.     Left lower leg: No swelling.  Skin:    General: Skin is warm.     Findings: No rash.  Neurological:     Mental Status: He is alert and oriented to person, place, and time.     Comments: Able to straight leg raise with left leg.  Barely able to straight leg raise with right leg     Data  Reviewed: Potassium 5.7 and hemoglobin 9.0.  Family Communication: updated daughter on phone  Disposition: Status is: Inpatient Remains inpatient appropriate because: Still working on having a bowel movement postoperatively.  Awaiting to hear back on a rehab bed.  Treating hyperkalemia.  Planned Discharge Destination: Rehab    Time spent: 28 minutes  Author: Alford Highland, MD 11/16/2022 1:09 PM  For on call review [email or web address].

## 2022-11-16 NOTE — Progress Notes (Signed)
Physical Therapy Treatment Patient Details Name: LAYNE DILAURO MRN: [id number] DOB: [birth year] Today's Date: 11/16/2022   History of Present Illness BRONISLAUS VERDELL is a 89 y.o. male with medical history significant of multiple medical issues including atrial fibrillation, HFrEF, coronary artery disease, end-stage renal disease on hemodialysis Monday Wednesday Friday, GERD, hypertension, hyperlipidemia, type 2 diabetes presenting with fall and right hip fracture. He is s/p ORIF,11/12/22; He typically gets HD on MWF however due to surgery yesterday will be getting Dialysis today, Saturday 11/13/22.    PT Comments    Participated in exercises as described below.  Stood at EOB with mod a x 1 and able to do standing ex today with good ROM RLE.  After seated rest, he is able to stand again and given improvement in mobility, transfer to chair with +1 is attempted.  Pt turns 1/2 way to chair and opts to sit before fully turning.  He is guided safely to chair.  Remained up with needs met.    Recommendations for follow up therapy are one component of a multi-disciplinary discharge planning process, led by the attending physician.  Recommendations may be updated based on patient status, additional functional criteria and insurance authorization.  Follow Up Recommendations       Assistance Recommended at Discharge Frequent or constant Supervision/Assistance  Patient can return home with the following Two people to help with walking and/or transfers;A lot of help with bathing/dressing/bathroom;Assistance with cooking/housework;Assist for transportation;Help with stairs or ramp for entrance   Equipment Recommendations  None recommended by PT    Recommendations for Other Services       Precautions / Restrictions Precautions Precautions: Fall Restrictions Weight Bearing Restrictions: Yes RLE Weight Bearing: Weight bearing as tolerated     Mobility  Bed Mobility Overal bed mobility: Needs  Assistance Bed Mobility: Supine to Sit     Supine to sit: Min assist, HOB elevated       Patient Response: Cooperative  Transfers Overall transfer level: Needs assistance Equipment used: Rolling walker (2 wheels) Transfers: Sit to/from Stand Sit to Stand: Mod assist, Min assist   Step pivot transfers: Mod assist       General transfer comment: improved steps today    Ambulation/Gait Ambulation/Gait assistance: Mod assist Gait Distance (Feet): 2 Feet Assistive device: Rolling walker (2 wheels) Gait Pattern/deviations: Step-to pattern Gait velocity: dec     General Gait Details: pt sat prior to turning fully to chair.   Stairs             Wheelchair Mobility    Modified Rankin (Stroke Patients Only)       Balance Overall balance assessment: Needs assistance Sitting-balance support: Bilateral upper extremity supported, Feet supported Sitting balance-Leahy Scale: Good     Standing balance support: Bilateral upper extremity supported Standing balance-Leahy Scale: Poor Standing balance comment: attempted with +1 but he still should have +2 today                            Cognition Arousal/Alertness: Awake/alert Behavior During Therapy: WFL for tasks assessed/performed Overall Cognitive Status: Within Functional Limits for tasks assessed                                          Exercises Other Exercises Other Exercises: supine and seated A/AAROM x 10    General Comments  Pertinent Vitals/Pain Pain Assessment Pain Assessment: Faces Faces Pain Scale: Hurts little more Pain Location: right hip - no pain at rest Pain Descriptors / Indicators: Aching, Sore Pain Intervention(s): Limited activity within patient's tolerance, Monitored during session, Repositioned    Home Living                          Prior Function            PT Goals (current goals can now be found in the care plan section)  Progress towards PT goals: Progressing toward goals    Frequency    BID      PT Plan Current plan remains appropriate    Co-evaluation              AM-PAC PT "6 Clicks" Mobility   Outcome Measure  Help needed turning from your back to your side while in a flat bed without using bedrails?: A Little Help needed moving from lying on your back to sitting on the side of a flat bed without using bedrails?: A Little Help needed moving to and from a bed to a chair (including a wheelchair)?: A Lot Help needed standing up from a chair using your arms (e.g., wheelchair or bedside chair)?: A Lot Help needed to walk in hospital room?: A Lot Help needed climbing 3-5 steps with a railing? : Total 6 Click Score: 13    End of Session Equipment Utilized During Treatment: Gait belt Activity Tolerance: Patient tolerated treatment well Patient left: in chair;with call bell/phone within reach;with chair alarm set;with family/visitor present Nurse Communication: Mobility status PT Visit Diagnosis: Muscle weakness (generalized) (M62.81);History of falling (Z91.81);Difficulty in walking, not elsewhere classified (R26.2);Pain Pain - Right/Left: Right Pain - part of body: Hip     Time: 0852-0903 PT Time Calculation (min) (ACUTE ONLY): 11 min  Charges:  $Therapeutic Exercise: 8-22 mins                   Danielle Dess, PTA 11/16/22, 9:34 AM

## 2022-11-16 NOTE — Assessment & Plan Note (Signed)
Hemoglobin came down from 12.3 on admission down to 9.0.  Continue to monitor.  Likely was a little dehydrated upon coming in.

## 2022-11-16 NOTE — Anesthesia Postprocedure Evaluation (Signed)
Anesthesia Post Note  Patient: Matthew [name]  Procedure(s) Performed: INTRAMEDULLARY (IM) NAIL INTERTROCHANTERIC (Right: Hip)  Patient location during evaluation: PACU Anesthesia Type: General Level of consciousness: awake and alert Pain management: pain level controlled Vital Signs Assessment: post-procedure vital signs reviewed and stable Respiratory status: spontaneous breathing, nonlabored ventilation, respiratory function stable and patient connected to nasal cannula oxygen Cardiovascular status: blood pressure returned to baseline and stable Postop Assessment: no apparent nausea or vomiting Anesthetic complications: no   No notable events documented.   Last Vitals:  Vitals:   11/15/22 2344 11/16/22 0747  BP: (!) 122/50 (!) 98/53  Pulse: 63 60  Resp: 16 16  Temp: 36.6 C (!) 36.4 C  SpO2: 98% 96%    Last Pain:  Vitals:   11/15/22 2200  TempSrc:   PainSc: 0-No pain                 Yevette Edwards

## 2022-11-16 NOTE — Consult Note (Signed)
[facility] Customer service manager [facility]) Accountable Care Organization (ACO) [facility] Liaison Note  11/16/2022  Matthew [name] October 31, 1930 [id number]  Location: RN Hospital Liaison screened the patient remotely at [facility].  Insurance: MCR ACO   Matthew [name] is a 89 y.o. male who is a Primary Care Patient of Tejan-Sie, Marcelino Freestone, MD. The patient was screened for readmission hospitalization with noted extreme risk score for unplanned readmission risk with 1 IP in 6 months.  The patient was assessed for potential [facility]) Care Management service needs for post hospital transition for care coordination. Review of patient's electronic medical record reveals patient was admitted Intertrochanteric Fracture. Pt will discharge to a STR for SNF level of care. Based upon the pending facility of choice Environmental consultant or [facility]). Liaison will collaborate with the PAC-RN for ongoing follow up interventions.   Plan:  [facility] Liaison will continue to follow progress and disposition to asess for post hospital community care coordination/management needs.  Referral request for community care coordination: pending disposition.   Care Management/Population Health does not replace or interfere with any arrangements made by the Inpatient Transition of Care team.   For questions contact:   Elliot Cousin, RN, [facility] Liaison [place]   Population Health Office Hours MTWF  8:00 am-6:00 pm Off on Thursday [phone] mobile [phone] [Office toll free line] Office Hours are M-F 8:30 - 5 pm 24 hour nurse advise line [phone] Concierge  [email or web address]

## 2022-11-16 NOTE — Care Management Important Message (Signed)
Important Message  Patient Details  Name: Matthew [name] MRN: [id number] Date of Birth: February 13, 1931   Medicare Important Message Given:  N/A - LOS <3 / Initial given by admissions     Olegario Messier A Saprina Chuong 11/16/2022, 10:04 AM

## 2022-11-16 NOTE — Progress Notes (Addendum)
[facility]  ROUNDING NOTE   Subjective:   Matthew [name] is an 89 year old male with medical concerns including CAD status post CABG, hypothyroidism, hypertension, CHF, type 2 diabetes, and end-stage renal disease on hemodialysis. He presents to the ED after suffering a fall and rotation and shortened right leg. He has been admitted for Hip fracture [facility]) [S72.009A] End-stage renal disease on hemodialysis (HCC) [N18.6, Z99.2] Fall, initial encounter [W19.XXXA] Intertrochanteric fracture of right femur, closed, initial encounter (HCC) [S72.141A]  Patient seen sitting up in chair No family at bedside States he feels well.  Tolerating meals  Objective:  Vital signs in last 24 hours:  Temp:  [97.5 F (36.4 C)-98 F (36.7 C)] 97.5 F (36.4 C) (06/25 0747) Pulse Rate:  [60-80] 62 (06/25 1200) Resp:  [16-18] 18 (06/25 1200) BP: (98-122)/(45-53) 111/45 (06/25 1200) SpO2:  [85 %-100 %] 100 % (06/25 1200)  Weight change:  Filed Weights   11/13/22 1848 11/15/22 0809 11/15/22 1203  Weight: 92.2 kg 96.9 kg 96.9 kg    Intake/Output: I/O last 3 completed shifts: In: 597 [P.O.:360; Other:237] Out: 575 [Urine:575]   Intake/Output this shift:  No intake/output data recorded.  Physical Exam: General: NAD  Head: Normocephalic, atraumatic. Moist oral mucosal membranes  Eyes: Anicteric  Lungs:  Clear to auscultation  Heart: Regular rate and rhythm  Abdomen:  Soft, nontender  Extremities:  No peripheral edema, right hip with clean dressings.    Neurologic: Nonfocal, moving all four extremities  Skin: No lesions  Access: Left AVF    Basic Metabolic Panel: Recent Labs  Lab 11/12/22 0404 11/13/22 0522 11/15/22 0832 11/16/22 0450  NA 134* 135 132*  --   K 4.7 4.9 5.9* 5.7*  CL 98 107 95*  --   CO2 27 18* 26  --   GLUCOSE 131* 155* 127*  --   BUN 87* 85* 82*  --   CREATININE 3.93* 3.72* 3.72*  --   CALCIUM 8.6* 7.2* 8.4*  --   PHOS  --   --  5.8*  --       Liver Function Tests: Recent Labs  Lab 11/13/22 0522 11/15/22 0832  AST 35  --   ALT 30  --   ALKPHOS 103  --   BILITOT 0.6  --   PROT 5.2*  --   ALBUMIN 2.4* 2.9*    No results for input(s): "LIPASE", "AMYLASE" in the last 168 hours. No results for input(s): "AMMONIA" in the last 168 hours.  CBC: Recent Labs  Lab 11/12/22 0404 11/13/22 0656 11/14/22 0856 11/16/22 0450  WBC 4.6 9.7 8.1  --   NEUTROABS 2.6  --   --   --   HGB 12.3* 10.9* 9.8* 9.0*  HCT 37.1* 33.1* 29.6*  --   MCV 98.1 98.5 98.7  --   PLT 110* 103* 100*  --      Cardiac Enzymes: No results for input(s): "CKTOTAL", "CKMB", "CKMBINDEX", "TROPONINI" in the last 168 hours.  BNP: Invalid input(s): "POCBNP"  CBG: Recent Labs  Lab 11/15/22 0728 11/15/22 1423 11/15/22 1753 11/15/22 2154 11/16/22 0749  GLUCAP 91 123* 97 117* 92     Microbiology: Results for orders placed or performed in visit on 10/15/22  Culture, Urine     Status: Abnormal   Collection Time: 10/15/22  2:05 PM   Specimen: Urine   UC  Result Value Ref Range Status   Urine Culture, Routine Final report (A)  Final   Organism ID, Bacteria  Escherichia coli (A)  Final    Comment: Cefazolin <=4 ug/mL Cefazolin with an MIC <=16 predicts susceptibility to the oral agents cefaclor, cefdinir, cefpodoxime, cefprozil, cefuroxime, cephalexin, and loracarbef when used for therapy of uncomplicated urinary tract infections due to E. coli, Klebsiella pneumoniae, and Proteus mirabilis. Greater than 100,000 colony forming units per mL    Antimicrobial Susceptibility Comment  Final    Comment:       ** S = Susceptible; I = Intermediate; R = Resistant **                    P = Positive; N = Negative             MICS are expressed in micrograms per mL    Antibiotic                 RSLT#1    RSLT#2    RSLT#3    RSLT#4 Amoxicillin/Clavulanic Acid    S Ampicillin                     S Cefepime                       S Ceftriaxone                     S Cefuroxime                     S Ciprofloxacin                  S Ertapenem                      S Gentamicin                     S Imipenem                       S Levofloxacin                   S Meropenem                      S Nitrofurantoin                 S Piperacillin/Tazobactam        S Tetracycline                   S Tobramycin                     S Trimethoprim/Sulfa             S     Coagulation Studies: No results for input(s): "LABPROT", "INR" in the last 72 hours.   Urinalysis: No results for input(s): "COLORURINE", "LABSPEC", "PHURINE", "GLUCOSEU", "HGBUR", "BILIRUBINUR", "KETONESUR", "PROTEINUR", "UROBILINOGEN", "NITRITE", "LEUKOCYTESUR" in the last 72 hours.  Invalid input(s): "APPERANCEUR"    Imaging: No results found.   Medications:    anticoagulant sodium citrate     methocarbamol (ROBAXIN) IV      aspirin EC  325 mg Oral Q breakfast   bisacodyl  10 mg Oral Once   Chlorhexidine Gluconate Cloth  6 each Topical Q0600   clopidogrel  75 mg Oral Daily   docusate sodium  100 mg Oral BID   feeding supplement (NEPRO CARB STEADY)  237 mL Oral TID  BM   [START ON 11/17/2022] ferrous sulfate  325 mg Oral Q breakfast   furosemide  40 mg Oral Daily   glipiZIDE  2.5 mg Oral Q1200   insulin aspart  0-6 Units Subcutaneous TID WC   lactulose  30 g Oral BID   levothyroxine  100 mcg Oral q morning   midodrine  5 mg Oral TID WC   polyethylene glycol  17 g Oral BID   senna  1 tablet Oral BID   sotalol  80 mg Oral BID   traMADol  50 mg Oral Q6H   acetaminophen, alteplase, alum & mag hydroxide-simeth, anticoagulant sodium citrate, bisacodyl, fluticasone, HYDROcodone-acetaminophen, HYDROmorphone (DILAUDID) injection, lidocaine (PF), lidocaine-prilocaine, menthol-cetylpyridinium **OR** phenol, methocarbamol **OR** methocarbamol (ROBAXIN) IV, morphine injection, ondansetron **OR** ondansetron (ZOFRAN) IV, pentafluoroprop-tetrafluoroeth, sodium  phosphate  Assessment/ Plan:  Mr. JAX KENTNER is a 89 y.o.  male with end stage renal disease on hemodialysis, coronary artery disease status post CABG, hypotension, hypothyroidism, congestive heart failure, diabetes mellitus type II who is admitted to [facility] on 11/12/2022 for Hip fracture (HCC) [S72.009A] End-stage renal disease on hemodialysis (HCC) [N18.6, Z99.2] Fall, initial encounter [W19.XXXA] Intertrochanteric fracture of right femur, closed, initial encounter (HCC) [S72.141A]  Underwent right hip surgery by Dr. Martha Clan on 6/21.   [facility] [address] RIJ permcath 98.5kg   End stage renal disease on hemodialysis.  Dialysis received yesterday, UF 0 due to hypotension. Midodrine 5mg  given. Next treatment scheduled for Wednesday.  2. Anemia of chronic kidney disease Lab Results  Component Value Date   HGB 9.0 (L) 11/16/2022    Patient receives Mircera at outpatient clinic.  Hemoglobin within desired range.  3. Secondary Hyperparathyroidism: with hip fracture.   Lab Results  Component Value Date   CALCIUM 8.4 (L) 11/15/2022   CAION 1.16 04/15/2021   PHOS 5.8 (H) 11/15/2022  Phosphorus improving  Previously on calcium carbonate with meals.   4.  Hypotension: midodrine before dialysis treatments. On metoprolol and furosemide on nondialysis days. Midodrine ordered during dialysis  5. Hyperkalemia, 5.7. Primary team has ordered calcium gluconate, induline and sodium bicarb for management. Will change supplements to Nepro.    LOS: 4   6/25/20241:51 PM

## 2022-11-16 NOTE — Progress Notes (Signed)
OT Cancellation Note  Patient Details Name: Matthew [name] MRN: [id number] DOB: November 01, 1930   Cancelled Treatment:    Reason Eval/Treat Not Completed: Medical issues which prohibited therapy;Other (comment) Chart reviewed - pt noted to have potassium critically high at 6.5; contraindicated for exertional activity at this time. Will continue to follow and initiate services as pt medically appropriate to participate in therapy.    Adessa Primiano L. Carlos Quackenbush, OTR/L  11/16/22, 3:42 PM

## 2022-11-17 DIAGNOSIS — S72001S Fracture of unspecified part of neck of right femur, sequela: Secondary | ICD-10-CM | POA: Diagnosis not present

## 2022-11-17 LAB — GLUCOSE, CAPILLARY
Glucose-Capillary: 95 mg/dL (ref 70–99)
Glucose-Capillary: 104 mg/dL — ABNORMAL HIGH (ref 70–99)
Glucose-Capillary: 90 mg/dL (ref 70–99)
Glucose-Capillary: 115 mg/dL — ABNORMAL HIGH (ref 70–99)

## 2022-11-17 LAB — BASIC METABOLIC PANEL
Anion gap: 8 (ref 5–15)
BUN: 55 mg/dL — ABNORMAL HIGH (ref 8–23)
CO2: 30 mmol/L (ref 22–32)
Calcium: 8.6 mg/dL — ABNORMAL LOW (ref 8.9–10.3)
Chloride: 95 mmol/L — ABNORMAL LOW (ref 98–111)
Creatinine, Ser: 3.32 mg/dL — ABNORMAL HIGH (ref 0.61–1.24)
GFR, Estimated: 17 mL/min — ABNORMAL LOW (ref >60)
Glucose, Bld: 139 mg/dL — ABNORMAL HIGH (ref 70–99)
Potassium: 5.7 mmol/L — ABNORMAL HIGH (ref 3.5–5.1)
Sodium: 133 mmol/L — ABNORMAL LOW (ref 135–145)

## 2022-11-17 LAB — CBC
HCT: 26.6 % — ABNORMAL LOW (ref 39.0–52.0)
Hemoglobin: 8.6 g/dL — ABNORMAL LOW (ref 13.0–17.0)
MCH: 32.3 pg (ref 26.0–34.0)
MCHC: 32.3 g/dL (ref 30.0–36.0)
MCV: 100 fL (ref 80.0–100.0)
Platelets: 127 10*3/uL — ABNORMAL LOW (ref 150–400)
RBC: 2.66 MIL/uL — ABNORMAL LOW (ref 4.22–5.81)
RDW: 14.6 % (ref 11.5–15.5)
WBC: 6.4 10*3/uL (ref 4.0–10.5)
nRBC: 0 % (ref 0.0–0.2)

## 2022-11-17 MED ORDER — SODIUM ZIRCONIUM CYCLOSILICATE 10 G PO PACK
10.0000 g | PACK | Freq: Once | ORAL | Status: AC
  Administered 2022-11-17: 10 g via ORAL
  Filled 2022-11-17: qty 1

## 2022-11-17 NOTE — Progress Notes (Signed)
[facility]  ROUNDING NOTE   Subjective:   Matthew [name] is an 89 year old male with medical concerns including CAD status post CABG, hypothyroidism, hypertension, CHF, type 2 diabetes, and end-stage renal disease on hemodialysis. He presents to the ED after suffering a fall and rotation and shortened right leg. He has been admitted for Hip fracture [facility]) [S72.009A] End-stage renal disease on hemodialysis (HCC) [N18.6, Z99.2] Fall, initial encounter [W19.XXXA] Intertrochanteric fracture of right femur, closed, initial encounter [facility]) [S72.141A]  Patient seen sitting up in chair Denies discomfort  Scheduled for dialysis later today  Rehab bed accepted   Objective:  Vital signs in last 24 hours:  Temp:  [97.7 F (36.5 C)-97.8 F (36.6 C)] 97.8 F (36.6 C) (06/26 0807) Pulse Rate:  [62-65] 65 (06/26 0807) Resp:  [17-18] 18 (06/26 0807) BP: (96-142)/(47-71) 130/71 (06/26 0807) SpO2:  [97 %-100 %] 100 % (06/26 0807)  Weight change:  Filed Weights   11/13/22 1848 11/15/22 0809 11/15/22 1203  Weight: 92.2 kg 96.9 kg 96.9 kg    Intake/Output: I/O last 3 completed shifts: In: -  Out: 101 [Urine:100; Stool:1]   Intake/Output this shift:  Total I/O In: 240 [P.O.:240] Out: 125 [Urine:125]  Physical Exam: General: NAD  Head: Normocephalic, atraumatic. Moist oral mucosal membranes  Eyes: Anicteric  Lungs:  Clear to auscultation  Heart: Regular rate and rhythm  Abdomen:  Soft, nontender  Extremities:  No peripheral edema, right hip with clean dressings.    Neurologic: Nonfocal, moving all four extremities  Skin: No lesions  Access: Left AVF    Basic Metabolic Panel: Recent Labs  Lab 11/12/22 0404 11/13/22 0522 11/15/22 0832 11/16/22 0450 11/16/22 1300 11/17/22 0436  NA 134* 135 132*  --   --  133*  K 4.7 4.9 5.9* 5.7* 6.5* 5.7*  CL 98 107 95*  --   --  95*  CO2 27 18* 26  --   --  30  GLUCOSE 131* 155* 127*  --   --  139*  BUN 87* 85* 82*  --    --  55*  CREATININE 3.93* 3.72* 3.72*  --   --  3.32*  CALCIUM 8.6* 7.2* 8.4*  --   --  8.6*  PHOS  --   --  5.8*  --   --   --      Liver Function Tests: Recent Labs  Lab 11/13/22 0522 11/15/22 0832  AST 35  --   ALT 30  --   ALKPHOS 103  --   BILITOT 0.6  --   PROT 5.2*  --   ALBUMIN 2.4* 2.9*    No results for input(s): "LIPASE", "AMYLASE" in the last 168 hours. No results for input(s): "AMMONIA" in the last 168 hours.  CBC: Recent Labs  Lab 11/12/22 0404 11/13/22 0656 11/14/22 0856 11/16/22 0450 11/17/22 0436  WBC 4.6 9.7 8.1  --  6.4  NEUTROABS 2.6  --   --   --   --   HGB 12.3* 10.9* 9.8* 9.0* 8.6*  HCT 37.1* 33.1* 29.6*  --  26.6*  MCV 98.1 98.5 98.7  --  100.0  PLT 110* 103* 100*  --  127*     Cardiac Enzymes: No results for input(s): "CKTOTAL", "CKMB", "CKMBINDEX", "TROPONINI" in the last 168 hours.  BNP: Invalid input(s): "POCBNP"  CBG: Recent Labs  Lab 11/16/22 1130 11/16/22 1801 11/16/22 2212 11/17/22 0807 11/17/22 1116  GLUCAP 114* 122* 138* 95 104*  Microbiology: Results for orders placed or performed in visit on 10/15/22  Culture, Urine     Status: Abnormal   Collection Time: 10/15/22  2:05 PM   Specimen: Urine   UC  Result Value Ref Range Status   Urine Culture, Routine Final report (A)  Final   Organism ID, Bacteria Escherichia coli (A)  Final    Comment: Cefazolin <=4 ug/mL Cefazolin with an MIC <=16 predicts susceptibility to the oral agents cefaclor, cefdinir, cefpodoxime, cefprozil, cefuroxime, cephalexin, and loracarbef when used for therapy of uncomplicated urinary tract infections due to E. coli, Klebsiella pneumoniae, and Proteus mirabilis. Greater than 100,000 colony forming units per mL    Antimicrobial Susceptibility Comment  Final    Comment:       ** S = Susceptible; I = Intermediate; R = Resistant **                    P = Positive; N = Negative             MICS are expressed in micrograms per mL     Antibiotic                 RSLT#1    RSLT#2    RSLT#3    RSLT#4 Amoxicillin/Clavulanic Acid    S Ampicillin                     S Cefepime                       S Ceftriaxone                    S Cefuroxime                     S Ciprofloxacin                  S Ertapenem                      S Gentamicin                     S Imipenem                       S Levofloxacin                   S Meropenem                      S Nitrofurantoin                 S Piperacillin/Tazobactam        S Tetracycline                   S Tobramycin                     S Trimethoprim/Sulfa             S     Coagulation Studies: No results for input(s): "LABPROT", "INR" in the last 72 hours.   Urinalysis: No results for input(s): "COLORURINE", "LABSPEC", "PHURINE", "GLUCOSEU", "HGBUR", "BILIRUBINUR", "KETONESUR", "PROTEINUR", "UROBILINOGEN", "NITRITE", "LEUKOCYTESUR" in the last 72 hours.  Invalid input(s): "APPERANCEUR"    Imaging: No results found.   Medications:    anticoagulant sodium citrate     methocarbamol (ROBAXIN) IV  aspirin EC  325 mg Oral Q breakfast   Chlorhexidine Gluconate Cloth  6 each Topical Q0600   clopidogrel  75 mg Oral Daily   docusate sodium  100 mg Oral BID   feeding supplement (NEPRO CARB STEADY)  237 mL Oral TID BM   ferrous sulfate  325 mg Oral Q breakfast   furosemide  40 mg Oral Daily   glipiZIDE  2.5 mg Oral Q1200   insulin aspart  0-6 Units Subcutaneous TID WC   lactulose  30 g Oral BID   levothyroxine  100 mcg Oral q morning   midodrine  5 mg Oral TID WC   polyethylene glycol  17 g Oral BID   senna  1 tablet Oral BID   sotalol  80 mg Oral BID   traMADol  50 mg Oral Q6H   acetaminophen, alteplase, alum & mag hydroxide-simeth, anticoagulant sodium citrate, bisacodyl, fluticasone, HYDROcodone-acetaminophen, HYDROmorphone (DILAUDID) injection, lidocaine (PF), lidocaine-prilocaine, menthol-cetylpyridinium **OR** phenol, methocarbamol **OR**  methocarbamol (ROBAXIN) IV, morphine injection, ondansetron **OR** ondansetron (ZOFRAN) IV, pentafluoroprop-tetrafluoroeth, sodium phosphate  Assessment/ Plan:  Matthew [name] is a 89 y.o.  male with end stage renal disease on hemodialysis, coronary artery disease status post CABG, hypotension, hypothyroidism, congestive heart failure, diabetes mellitus type II who is admitted to [facility] on 11/12/2022 for Hip fracture (HCC) [S72.009A] End-stage renal disease on hemodialysis (HCC) [N18.6, Z99.2] Fall, initial encounter [W19.XXXA] Intertrochanteric fracture of right femur, closed, initial encounter (HCC) [S72.141A]  Underwent right hip surgery by Dr. Martha Clan on 6/21.   [facility] [address] RIJ permcath 98.5kg   End stage renal disease on hemodialysis.  Scheduled for dialysis later today, seated in chair. Will receive Midodrine and monitor blood pressure.   2. Anemia of chronic kidney disease Lab Results  Component Value Date   HGB 8.6 (L) 11/17/2022    Patient receives Mircera at outpatient clinic.  Hemoglobin within desired range.  3. Secondary Hyperparathyroidism: with hip fracture.   Lab Results  Component Value Date   CALCIUM 8.6 (L) 11/17/2022   CAION 1.16 04/15/2021   PHOS 5.8 (H) 11/15/2022  Will continue to monitor bone minerals  Previously on calcium carbonate with meals.   4.  Hypotension: midodrine before dialysis treatments. On metoprolol and furosemide on nondialysis days. Midodrine ordered during dialysis.  Acceptable blood pressure recorded, 130/71  5. Hyperkalemia, 5.7. Treated by primary team yesterday, Will obtain labs with dialysis and assist with further correction.    LOS: 5   6/26/202412:15 PM

## 2022-11-17 NOTE — Progress Notes (Signed)
Physical Therapy Treatment Patient Details Name: Matthew [name] MRN: [id number] DOB: 1931-01-18 Today's Date: 11/17/2022   History of Present Illness Matthew [name] is a 89 y.o. male with medical history significant of multiple medical issues including atrial fibrillation, HFrEF, coronary artery disease, end-stage renal disease on hemodialysis Monday Wednesday Friday, GERD, hypertension, hyperlipidemia, type 2 diabetes presenting with fall and right hip fracture. He is s/p ORIF,11/12/22; He typically gets HD on MWF however due to surgery yesterday will be getting Dialysis today, Saturday 11/13/22.    PT Comments    Pt was long sitting in bed upon arrival. He is A and O x 4 and agreeable to session. Endorses feeling better. Elevated K+ noted however slightly improved from previous PT session. HR and vitals were stable throughout this session. Pt was able to exit R side of bed with increased time + assistance. Sat EOB and perform STS a few times prior to advancing to taking steps to dialysis chair. Will have HD later this date. Pt struggles with tolerating wt on RLE during advancement of LLE. Pt has heavy reliance of Rw with all standing activity. Pt did urinate ~ prior to getting to chair. Pt was cued to increase RLE there ex while in bed and throughout the day. Author will return later this afternoon after HD to assist with advancing gait, strength, and overall safe functional mobility. DC recs remain appropriate.   Recommendations for follow up therapy are one component of a multi-disciplinary discharge planning process, led by the attending physician.  Recommendations may be updated based on patient status, additional functional criteria and insurance authorization.     Assistance Recommended at Discharge Frequent or constant Supervision/Assistance  Patient can return home with the following A lot of help with walking and/or transfers;A lot of help with bathing/dressing/bathroom;Assistance  with cooking/housework;Direct supervision/assist for medications management;Direct supervision/assist for financial management;Assist for transportation;Help with stairs or ramp for entrance   Equipment Recommendations  Other (comment) (Defer to next level of care)       Precautions / Restrictions Precautions Precautions: Fall Restrictions Weight Bearing Restrictions: Yes RLE Weight Bearing: Weight bearing as tolerated     Mobility  Bed Mobility Overal bed mobility: Needs Assistance Bed Mobility: Supine to Sit  Supine to sit: Min assist, HOB elevated  General bed mobility comments: Min assist + increased time required to exit R side of bed. Vcs for technique and sequencing improvements    Transfers Overall transfer level: Needs assistance Equipment used: Rolling walker (2 wheels) Transfers: Sit to/from Stand Sit to Stand: Min assist, Mod assist, From elevated surface  General transfer comment: Pt stood 2 x from elevated bed height with 2nd attempt from slightly lowered bed height. Pt was cued for improved technique    Ambulation/Gait Ambulation/Gait assistance: Min assist, Mod assist Gait Distance (Feet): 3 Feet Assistive device: Rolling walker (2 wheels) Gait Pattern/deviations: Antalgic, Step-to pattern Gait velocity: dec  General Gait Details: Pt was able to take steps from EOB to recliner with increased time + heavy UE support. Pain limited. struggles to tolerate wt on RLE to advance LLE    Balance Overall balance assessment: Needs assistance Sitting-balance support: Feet supported, Bilateral upper extremity supported Sitting balance-Leahy Scale: Good  Standing balance support: Bilateral upper extremity supported, During functional activity, Reliant on assistive device for balance Standing balance-Leahy Scale: Poor Standing balance comment: Reliant on RW for support during all standing activity     Cognition Arousal/Alertness: Awake/alert Behavior During Therapy:  [facility] for tasks  assessed/performed Overall Cognitive Status: Within Functional Limits for tasks assessed    General Comments: A and O x 4               Pertinent Vitals/Pain Pain Assessment Pain Assessment: 0-10 Pain Score: 6  Pain Location: right hip - no pain at rest Pain Descriptors / Indicators: Aching, Sore Pain Intervention(s): Limited activity within patient's tolerance, Monitored during session, Premedicated before session, Repositioned, Ice applied     PT Goals (current goals can now be found in the care plan section) Acute Rehab PT Goals Patient Stated Goal: Get better and walk again Progress towards PT goals: Progressing toward goals    Frequency    BID      PT Plan Current plan remains appropriate    Co-evaluation     PT goals addressed during session: Mobility/safety with mobility;Strengthening/ROM;Proper use of DME        AM-PAC PT "6 Clicks" Mobility   Outcome Measure  Help needed turning from your back to your side while in a flat bed without using bedrails?: A Little Help needed moving from lying on your back to sitting on the side of a flat bed without using bedrails?: A Little Help needed moving to and from a bed to a chair (including a wheelchair)?: A Lot Help needed standing up from a chair using your arms (e.g., wheelchair or bedside chair)?: A Lot Help needed to walk in hospital room?: A Lot Help needed climbing 3-5 steps with a railing? : Total 6 Click Score: 13    End of Session Equipment Utilized During Treatment: Gait belt Activity Tolerance: Patient tolerated treatment well Patient left: in chair;with call bell/phone within reach;with chair alarm set;with family/visitor present (in HD chair with call bell + phone  in reach) Nurse Communication: Mobility status PT Visit Diagnosis: Muscle weakness (generalized) (M62.81);History of falling (Z91.81);Difficulty in walking, not elsewhere classified (R26.2);Pain Pain - Right/Left:  Right Pain - part of body: Hip     Time: 1610-9604 PT Time Calculation (min) (ACUTE ONLY): 30 min  Charges:  $Therapeutic Activity: 23-37 mins                     Jetta Lout PTA 11/17/22, 8:27 AM

## 2022-11-17 NOTE — Progress Notes (Signed)
PT Cancellation Note  Patient Details Name: CHIRON CAMPIONE MRN: [id number] DOB: 27-Sep-1930   Cancelled Treatment:     PT attempt. Pt off floor for HD. Will return tomorrow and continue to follow per current POC.   Rushie Chestnut 11/17/2022, 3:40 PM

## 2022-11-17 NOTE — TOC Progression Note (Signed)
Transition of Care [facility]) - Progression Note    Patient Details  Name: Matthew [name] MRN: [id number] Date of Birth: June 04, 1930  Transition of Care [facility]) CM/SW Contact  Garret Reddish, RN Phone Number: 11/17/2022, 2:51 PM  Clinical Narrative:   I have reviewed bed offers with patient's daughter [name].  She has accepted bed offer at [facility].  I have informed her that [facility] can accept [name] on Friday.    I have asked Neurology to schedule dialysis for an early morning dialysis treatment.   I have informed Tiffany, Admission Coordinator with [facility] that patient's outpatient Dialysis schedule will be M-W-F at 11 am at [facility].   Tiffany reports that [facility] can manage the outpatient Dialysis schedule.   TOC will continue to follow for discharge planning.      Expected Discharge Plan: Skilled Nursing Facility Barriers to Discharge: Continued Medical Work up  Expected Discharge Plan and Services     Post Acute Care Choice: Skilled Nursing Facility Living arrangements for the past 2 months: Single Family Home                                       Social Determinants of Health (SDOH) Interventions SDOH Screenings   Food Insecurity: No Food Insecurity (11/12/2022)  Housing: Low Risk  (11/12/2022)  Transportation Needs: No Transportation Needs (11/12/2022)  Utilities: Not At Risk (11/12/2022)  Depression (PHQ2-9): Low Risk  (09/21/2022)  Financial Resource Strain: Low Risk  (01/10/2018)  Physical Activity: Inactive (01/10/2018)  Social Connections: Socially Integrated (01/10/2018)  Stress: No Stress Concern Present (01/10/2018)  Tobacco Use: Medium Risk (11/12/2022)    Readmission Risk Interventions    02/12/2021    3:28 PM  Readmission Risk Prevention Plan  Transportation Screening Complete  Medication Review [employer]) Complete  SW Recovery Care/Counseling Consult Complete  Skilled Nursing Facility Complete

## 2022-11-17 NOTE — Progress Notes (Signed)
Subjective:  POD #5 s/p medullary fixation for right intertrochanteric hip fracture..   Patient reports right hip pain as mild.  Patient is on his way to hemodialysis when I walked in the room.  He has no complaints today.  Objective:   VITALS:   Vitals:   11/16/22 1606 11/16/22 1607 11/16/22 2052 11/17/22 0807  BP: (!) 96/47 (!) 96/47 (!) 142/63 130/71  Pulse: 62 62 62 65  Resp: 17 17 18 18   Temp: 97.7 F (36.5 C) 97.7 F (36.5 C)  97.8 F (36.6 C)  TempSrc:      SpO2: 97% 100% 100% 100%  Weight:      Height:        PHYSICAL EXAM: Right lower extremity Neurovascular intact Sensation intact distally Intact pulses distally Dorsiflexion/Plantar flexion intact Incision: scant drainage No cellulitis present Compartment soft  LABS  Results for orders placed or performed during the hospital encounter of 11/12/22 (from the past 24 hour(s))  Glucose, capillary     Status: Abnormal   Collection Time: 11/16/22  6:01 PM  Result Value Ref Range   Glucose-Capillary 122 (H) 70 - 99 mg/dL  Glucose, capillary     Status: Abnormal   Collection Time: 11/16/22 10:12 PM  Result Value Ref Range   Glucose-Capillary 138 (H) 70 - 99 mg/dL  CBC     Status: Abnormal   Collection Time: 11/17/22  4:36 AM  Result Value Ref Range   WBC 6.4 4.0 - 10.5 K/uL   RBC 2.66 (L) 4.22 - 5.81 MIL/uL   Hemoglobin 8.6 (L) 13.0 - 17.0 g/dL   HCT 16.1 (L) 09.6 - 04.5 %   MCV 100.0 80.0 - 100.0 fL   MCH 32.3 26.0 - 34.0 pg   MCHC 32.3 30.0 - 36.0 g/dL   RDW 40.9 81.1 - 91.4 %   Platelets 127 (L) 150 - 400 K/uL   nRBC 0.0 0.0 - 0.2 %  Basic metabolic panel     Status: Abnormal   Collection Time: 11/17/22  4:36 AM  Result Value Ref Range   Sodium 133 (L) 135 - 145 mmol/L   Potassium 5.7 (H) 3.5 - 5.1 mmol/L   Chloride 95 (L) 98 - 111 mmol/L   CO2 30 22 - 32 mmol/L   Glucose, Bld 139 (H) 70 - 99 mg/dL   BUN 55 (H) 8 - 23 mg/dL   Creatinine, Ser 7.82 (H) 0.61 - 1.24 mg/dL   Calcium 8.6 (L) 8.9 -  10.3 mg/dL   GFR, Estimated 17 (L) >60 mL/min   Anion gap 8 5 - 15  Glucose, capillary     Status: None   Collection Time: 11/17/22  8:07 AM  Result Value Ref Range   Glucose-Capillary 95 70 - 99 mg/dL  Glucose, capillary     Status: Abnormal   Collection Time: 11/17/22 11:16 AM  Result Value Ref Range   Glucose-Capillary 104 (H) 70 - 99 mg/dL    No results found.  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  He will continue physical therapy and discharged to a skilled nursing facility once cleared medically.  Patient will continue Plavix and aspirin for DVT prophylaxis.  She will follow-up with [facility] in [place] in 10 to 14 days after discharge.  He is weightbearing as tolerated on the right lower extremity.    Juanell Fairly , MD 11/17/2022, 1:57 PM

## 2022-11-17 NOTE — Progress Notes (Signed)
Hemodialysis note  Received patient in bed to unit. Alert and oriented.  Informed consent signed and in chart.  Treatment initiated: 1403 Treatment completed: 1737  Patient tolerated well. Transported back to room, alert without acute distress.  Report given to patient's RN.   Access used: LUA AVF Access issues: none  Total UF removed: 1L Medication(s) given:  none Post HD VS: BP: 109/75 Post HD weight: 99.1 kg   [name]

## 2022-11-17 NOTE — Progress Notes (Signed)
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 PT/OT recs SNF. Continue on aspirin, plavix for DVT prophylaxis as per ortho surg    ESRD: on HD. HD as per nephro. Nephro recs apprec    Hyperkalemia: continue on lokelma. Can be managed w/ HD   Chronic hypotension: continue on midodrine    PAF: continue on sotalol    Chronic systolic CHF: echo in 2019 w/ EF 45%. Fluid management w/ HD   Hx of CAD: s/p 5 vessel CABG in 2001. Continue on aspirin, plavix   DM2: well controlled, HbA1c 5.9. Continue on SSI w/ accuchecks    HLD: continue on statin   Transaminitis: resolved    ACD: likely secondary to ESRD. No need for a transfusion currently      DVT prophylaxis: SCDs Code Status: DNR Family Communication: Disposition Plan: d/c to SNF on 11/19/22 when pt's bed is available  Level of care: Med-Surg  Status is: Inpatient Remains inpatient appropriate because: will d/c to SNF on 11/19/22 when pt's bed is available     Consultants:  Nephro   Procedures:   Antimicrobials:   Subjective: Pt c/o generalized weakness  Objective: Vitals:   11/16/22 1606 11/16/22 1607 11/16/22 2052 11/17/22 0807  BP: (!) 96/47 (!) 96/47 (!) 142/63 130/71  Pulse: 62 62 62 65  Resp: 17 17 18 18   Temp: 97.7 F (36.5 C) 97.7 F (36.5 C)  97.8 F (36.6 C)  TempSrc:      SpO2: 97% 100% 100%  100%  Weight:      Height:        Intake/Output Summary (Last 24 hours) at 11/17/2022 0833 Last data filed at 11/16/2022 1705 Gross per 24 hour  Intake --  Output 1 ml  Net -1 ml   Filed Weights   11/13/22 1848 11/15/22 0809 11/15/22 1203  Weight: 92.2 kg 96.9 kg 96.9 kg    Examination:  General exam: Appears calm and comfortable  Respiratory system: Clear to auscultation. Respiratory effort normal. Cardiovascular system: S1 & S2+. No  rubs, gallops or clicks. Gastrointestinal system: Abdomen is nondistended, soft and nontender. Normal bowel sounds heard. Central nervous system: Alert and awake. Moves all extremities  Psychiatry: Judgement and insight appears at baseline. Flat mood and affect    Data Reviewed: I have personally reviewed following labs and imaging studies  CBC: Recent Labs  Lab 11/12/22 0404 11/13/22 0656 11/14/22 0856 11/16/22 0450 11/17/22 0436  WBC 4.6 9.7 8.1  --  6.4  NEUTROABS 2.6  --   --   --   --   HGB 12.3* 10.9* 9.8* 9.0* 8.6*  HCT 37.1* 33.1* 29.6*  --  26.6*  MCV 98.1 98.5 98.7  --  100.0  PLT 110* 103* 100*  --  127*   Basic Metabolic Panel: Recent Labs  Lab 11/12/22 0404 11/13/22 0522 11/15/22 0832 11/16/22 0450 11/16/22  1300 11/17/22 0436  NA 134* 135 132*  --   --  133*  K 4.7 4.9 5.9* 5.7* 6.5* 5.7*  CL 98 107 95*  --   --  95*  CO2 27 18* 26  --   --  30  GLUCOSE 131* 155* 127*  --   --  139*  BUN 87* 85* 82*  --   --  55*  CREATININE 3.93* 3.72* 3.72*  --   --  3.32*  CALCIUM 8.6* 7.2* 8.4*  --   --  8.6*  PHOS  --   --  5.8*  --   --   --    GFR: Estimated Creatinine Clearance: 18.2 mL/min (A) (by C-G formula based on SCr of 3.32 mg/dL (H)). Liver Function Tests: Recent Labs  Lab 11/13/22 0522 11/15/22 0832  AST 35  --   ALT 30  --   ALKPHOS 103  --   BILITOT 0.6  --   PROT 5.2*  --   ALBUMIN 2.4* 2.9*   No results for input(s): "LIPASE", "AMYLASE" in the last 168 hours. No results for input(s):  "AMMONIA" in the last 168 hours. Coagulation Profile: Recent Labs  Lab 11/12/22 0618  INR 1.1   Cardiac Enzymes: No results for input(s): "CKTOTAL", "CKMB", "CKMBINDEX", "TROPONINI" in the last 168 hours. BNP (last 3 results) No results for input(s): "PROBNP" in the last 8760 hours. HbA1C: No results for input(s): "HGBA1C" in the last 72 hours. CBG: Recent Labs  Lab 11/16/22 0749 11/16/22 1130 11/16/22 1801 11/16/22 2212 11/17/22 0807  GLUCAP 92 114* 122* 138* 95   Lipid Profile: No results for input(s): "CHOL", "HDL", "LDLCALC", "TRIG", "CHOLHDL", "LDLDIRECT" in the last 72 hours. Thyroid Function Tests: No results for input(s): "TSH", "T4TOTAL", "FREET4", "T3FREE", "THYROIDAB" in the last 72 hours. Anemia Panel: No results for input(s): "VITAMINB12", "FOLATE", "FERRITIN", "TIBC", "IRON", "RETICCTPCT" in the last 72 hours. Sepsis Labs: No results for input(s): "PROCALCITON", "LATICACIDVEN" in the last 168 hours.  No results found for this or any previous visit (from the past 240 hour(s)).       Radiology Studies: No results found.      Scheduled Meds:  aspirin EC  325 mg Oral Q breakfast   Chlorhexidine Gluconate Cloth  6 each Topical Q0600   clopidogrel  75 mg Oral Daily   docusate sodium  100 mg Oral BID   feeding supplement (NEPRO CARB STEADY)  237 mL Oral TID BM   ferrous sulfate  325 mg Oral Q breakfast   furosemide  40 mg Oral Daily   glipiZIDE  2.5 mg Oral Q1200   insulin aspart  0-6 Units Subcutaneous TID WC   lactulose  30 g Oral BID   levothyroxine  100 mcg Oral q morning   midodrine  5 mg Oral TID WC   polyethylene glycol  17 g Oral BID   senna  1 tablet Oral BID   sotalol  80 mg Oral BID   traMADol  50 mg Oral Q6H   Continuous Infusions:  anticoagulant sodium citrate     methocarbamol (ROBAXIN) IV       LOS: 5 days    Time spent: 25 mins     Charise Killian, MD [facility] Pager [phone]  If 7PM-7AM, please  contact night-coverage [email or web address] 11/17/2022, 8:33 AM

## 2022-11-18 DIAGNOSIS — S72001S Fracture of unspecified part of neck of right femur, sequela: Secondary | ICD-10-CM | POA: Diagnosis not present

## 2022-11-18 LAB — BASIC METABOLIC PANEL
Anion gap: 9 (ref 5–15)
BUN: 39 mg/dL — ABNORMAL HIGH (ref 8–23)
CO2: 30 mmol/L (ref 22–32)
Calcium: 8.2 mg/dL — ABNORMAL LOW (ref 8.9–10.3)
Chloride: 96 mmol/L — ABNORMAL LOW (ref 98–111)
Creatinine, Ser: 2.38 mg/dL — ABNORMAL HIGH (ref 0.61–1.24)
GFR, Estimated: 25 mL/min — ABNORMAL LOW (ref >60)
Glucose, Bld: 120 mg/dL — ABNORMAL HIGH (ref 70–99)
Potassium: 4.8 mmol/L (ref 3.5–5.1)
Sodium: 135 mmol/L (ref 135–145)

## 2022-11-18 LAB — CBC
HCT: 27.4 % — ABNORMAL LOW (ref 39.0–52.0)
Hemoglobin: 8.7 g/dL — ABNORMAL LOW (ref 13.0–17.0)
MCH: 32.7 pg (ref 26.0–34.0)
MCHC: 31.8 g/dL (ref 30.0–36.0)
MCV: 103 fL — ABNORMAL HIGH (ref 80.0–100.0)
Platelets: 132 10*3/uL — ABNORMAL LOW (ref 150–400)
RBC: 2.66 MIL/uL — ABNORMAL LOW (ref 4.22–5.81)
RDW: 14.9 % (ref 11.5–15.5)
WBC: 5.7 10*3/uL (ref 4.0–10.5)
nRBC: 0 % (ref 0.0–0.2)

## 2022-11-18 LAB — GLUCOSE, CAPILLARY
Glucose-Capillary: 117 mg/dL — ABNORMAL HIGH (ref 70–99)
Glucose-Capillary: 105 mg/dL — ABNORMAL HIGH (ref 70–99)
Glucose-Capillary: 150 mg/dL — ABNORMAL HIGH (ref 70–99)
Glucose-Capillary: 119 mg/dL — ABNORMAL HIGH (ref 70–99)

## 2022-11-18 NOTE — Progress Notes (Signed)
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 PT/OT recs SNF and will d/c to SNF tomorrow. Continue on plavix, aspirin for DVT prophylaxis as per ortho surg    ESRD: on HD MWF. Nephro following and recs apprec    Hyperkalemia: WNL today    Chronic hypotension: continue on midodrine    PAF: continue on sotatol. Not on chronic anticoagulation, high fall risk    Chronic systolic CHF: echo in 2019 w/ EF 45%. Fluid management w/ HD    Hx of CAD: s/p 5 vessel CABG in 2001. Continue on plavix, aspirin    DM2: HbA1c 5.9, well controlled. Continue on SSI w/ accuchecks    HLD: continue on statin    Transaminitis: resolved    ACD: likely secondary to ESRD. Will transfuse if Hb < 7.0      DVT prophylaxis: SCDs Code Status: DNR Family Communication: Disposition Plan: d/c to SNF on 11/19/22 when pt's bed is available  Level of care: Med-Surg  Status is: Inpatient Remains inpatient appropriate because: will d/c to SNF on 11/19/22 when pt's bed is available     Consultants:  Nephro   Procedures:   Antimicrobials:   Subjective: Pt c/o fatigue   Objective: Vitals:   11/17/22 1730 11/17/22 1737 11/17/22 1744 11/18/22 0100  BP: (!) 108/42 (!) 108/43  (!) 115/49  Pulse: 60 61  64  Resp: 13 16  18   Temp:  97.7 F (36.5 C)  98.8 F (37.1 C)  TempSrc:  Oral  Oral  SpO2: 97%  98%  96%  Weight:   99.1 kg   Height:        Intake/Output Summary (Last 24 hours) at 11/18/2022 0756 Last data filed at 11/17/2022 1737 Gross per 24 hour  Intake 240 ml  Output 1125 ml  Net -885 ml   Filed Weights   11/15/22 0809 11/15/22 1203 11/17/22 1744  Weight: 96.9 kg 96.9 kg 99.1 kg    Examination:  General exam: Appears comfortable  Respiratory system: clear breath sounds b/l  Cardiovascular system: S1/S2+. No rubs or clicks Gastrointestinal system: abd is soft, NT, ND & normal bowel sounds  Central nervous system: alert & awake. Moves all extremities  Psychiatry: judgement and insight appears at baseline. Appropriate mood and affect     Data Reviewed: I have personally reviewed following labs and imaging studies  CBC: Recent Labs  Lab 11/12/22 0404 11/13/22 0656 11/14/22 0856 11/16/22 0450 11/17/22 0436 11/18/22 0543  WBC 4.6 9.7 8.1  --  6.4 5.7  NEUTROABS 2.6  --   --   --   --   --   HGB 12.3* 10.9* 9.8* 9.0* 8.6* 8.7*  HCT 37.1* 33.1* 29.6*  --  26.6* 27.4*  MCV 98.1 98.5 98.7  --  100.0 103.0*  PLT 110* 103* 100*  --  127* 132*   Basic Metabolic Panel:  Recent Labs  Lab 11/12/22 0404 11/13/22 0522 11/15/22 0832 11/16/22 0450 11/16/22 1300 11/17/22 0436 11/18/22 0543  NA 134* 135 132*  --   --  133* 135  K 4.7 4.9 5.9* 5.7* 6.5* 5.7* 4.8  CL 98 107 95*  --   --  95* 96*  CO2 27 18* 26  --   --  30 30  GLUCOSE 131* 155* 127*  --   --  139* 120*  BUN 87* 85* 82*  --   --  55* 39*  CREATININE 3.93* 3.72* 3.72*  --   --  3.32* 2.38*  CALCIUM 8.6* 7.2* 8.4*  --   --  8.6* 8.2*  PHOS  --   --  5.8*  --   --   --   --    GFR: Estimated Creatinine Clearance: 25.7 mL/min (A) (by C-G formula based on SCr of 2.38 mg/dL (H)). Liver Function Tests: Recent Labs  Lab 11/13/22 0522 11/15/22 0832  AST 35  --   ALT 30  --   ALKPHOS 103  --   BILITOT 0.6  --   PROT 5.2*  --   ALBUMIN 2.4* 2.9*   No results for input(s): "LIPASE", "AMYLASE" in  the last 168 hours. No results for input(s): "AMMONIA" in the last 168 hours. Coagulation Profile: Recent Labs  Lab 11/12/22 0618  INR 1.1   Cardiac Enzymes: No results for input(s): "CKTOTAL", "CKMB", "CKMBINDEX", "TROPONINI" in the last 168 hours. BNP (last 3 results) No results for input(s): "PROBNP" in the last 8760 hours. HbA1C: No results for input(s): "HGBA1C" in the last 72 hours. CBG: Recent Labs  Lab 11/16/22 2212 11/17/22 0807 11/17/22 1116 11/17/22 1821 11/17/22 2106  GLUCAP 138* 95 104* 90 115*   Lipid Profile: No results for input(s): "CHOL", "HDL", "LDLCALC", "TRIG", "CHOLHDL", "LDLDIRECT" in the last 72 hours. Thyroid Function Tests: No results for input(s): "TSH", "T4TOTAL", "FREET4", "T3FREE", "THYROIDAB" in the last 72 hours. Anemia Panel: No results for input(s): "VITAMINB12", "FOLATE", "FERRITIN", "TIBC", "IRON", "RETICCTPCT" in the last 72 hours. Sepsis Labs: No results for input(s): "PROCALCITON", "LATICACIDVEN" in the last 168 hours.  No results found for this or any previous visit (from the past 240 hour(s)).       Radiology Studies: No results found.      Scheduled Meds:  aspirin EC  325 mg Oral Q breakfast   Chlorhexidine Gluconate Cloth  6 each Topical Q0600   clopidogrel  75 mg Oral Daily   docusate sodium  100 mg Oral BID   feeding supplement (NEPRO CARB STEADY)  237 mL Oral TID BM   ferrous sulfate  325 mg Oral Q breakfast   furosemide  40 mg Oral Daily   glipiZIDE  2.5 mg Oral Q1200   insulin aspart  0-6 Units Subcutaneous TID WC   lactulose  30 g Oral BID   levothyroxine  100 mcg Oral q morning   midodrine  5 mg Oral TID WC   polyethylene glycol  17 g Oral BID   senna  1 tablet Oral BID   sotalol  80 mg Oral BID   traMADol  50 mg Oral Q6H   Continuous Infusions:  anticoagulant sodium citrate     methocarbamol (ROBAXIN) IV       LOS: 6 days    Time spent: 25 mins     Charise Killian, MD [facility] Pager [phone]  If 7PM-7AM, please contact night-coverage [email or web address] 11/18/2022, 7:56 AM

## 2022-11-18 NOTE — Progress Notes (Signed)
[facility]  ROUNDING NOTE   Subjective:   Matthew [name] is an 89 year old male with medical concerns including CAD status post CABG, hypothyroidism, hypertension, CHF, type 2 diabetes, and end-stage renal disease on hemodialysis. He presents to the ED after suffering a fall and rotation and shortened right leg. He has been admitted for Hip fracture [facility]) [S72.009A] End-stage renal disease on hemodialysis (HCC) [N18.6, Z99.2] Fall, initial encounter [W19.XXXA] Intertrochanteric fracture of right femur, closed, initial encounter [facility]) [S72.141A]  Patient seen sitting in chair Reports soreness when working with PT/OT  Dialysis went well yesterday  Objective:  Vital signs in last 24 hours:  Temp:  [97.6 F (36.4 C)-99.2 F (37.3 C)] 99.2 F (37.3 C) (06/27 0831) Pulse Rate:  [60-74] 64 (06/27 0831) Resp:  [11-18] 17 (06/27 0831) BP: (90-115)/(42-49) 101/49 (06/27 0831) SpO2:  [94 %-100 %] 97 % (06/27 0831) Weight:  [99.1 kg] 99.1 kg (06/26 1744)  Weight change:  Filed Weights   11/15/22 0809 11/15/22 1203 11/17/22 1744  Weight: 96.9 kg 96.9 kg 99.1 kg    Intake/Output: I/O last 3 completed shifts: In: 240 [P.O.:240] Out: 1125 [Urine:125; Other:1000]   Intake/Output this shift:  Total I/O In: 240 [P.O.:240] Out: 100 [Urine:100]  Physical Exam: General: NAD  Head: Normocephalic, atraumatic. Moist oral mucosal membranes  Eyes: Anicteric  Lungs:  Clear to auscultation  Heart: Regular rate and rhythm  Abdomen:  Soft, nontender  Extremities:  No peripheral edema, right hip with clean dressings.    Neurologic: Nonfocal, moving all four extremities  Skin: No lesions  Access: Left AVF    Basic Metabolic Panel: Recent Labs  Lab 11/12/22 0404 11/13/22 0522 11/15/22 0832 11/16/22 0450 11/16/22 1300 11/17/22 0436 11/18/22 0543  NA 134* 135 132*  --   --  133* 135  K 4.7 4.9 5.9* 5.7* 6.5* 5.7* 4.8  CL 98 107 95*  --   --  95* 96*  CO2 27 18* 26  --    --  30 30  GLUCOSE 131* 155* 127*  --   --  139* 120*  BUN 87* 85* 82*  --   --  55* 39*  CREATININE 3.93* 3.72* 3.72*  --   --  3.32* 2.38*  CALCIUM 8.6* 7.2* 8.4*  --   --  8.6* 8.2*  PHOS  --   --  5.8*  --   --   --   --      Liver Function Tests: Recent Labs  Lab 11/13/22 0522 11/15/22 0832  AST 35  --   ALT 30  --   ALKPHOS 103  --   BILITOT 0.6  --   PROT 5.2*  --   ALBUMIN 2.4* 2.9*    No results for input(s): "LIPASE", "AMYLASE" in the last 168 hours. No results for input(s): "AMMONIA" in the last 168 hours.  CBC: Recent Labs  Lab 11/12/22 0404 11/13/22 0656 11/14/22 0856 11/16/22 0450 11/17/22 0436 11/18/22 0543  WBC 4.6 9.7 8.1  --  6.4 5.7  NEUTROABS 2.6  --   --   --   --   --   HGB 12.3* 10.9* 9.8* 9.0* 8.6* 8.7*  HCT 37.1* 33.1* 29.6*  --  26.6* 27.4*  MCV 98.1 98.5 98.7  --  100.0 103.0*  PLT 110* 103* 100*  --  127* 132*     Cardiac Enzymes: No results for input(s): "CKTOTAL", "CKMB", "CKMBINDEX", "TROPONINI" in the last 168 hours.  BNP: Invalid input(s): "  POCBNP"  CBG: Recent Labs  Lab 11/17/22 1116 11/17/22 1821 11/17/22 2106 11/18/22 1013 11/18/22 1150  GLUCAP 104* 90 115* 117* 105*     Microbiology: Results for orders placed or performed in visit on 10/15/22  Culture, Urine     Status: Abnormal   Collection Time: 10/15/22  2:05 PM   Specimen: Urine   UC  Result Value Ref Range Status   Urine Culture, Routine Final report (A)  Final   Organism ID, Bacteria Escherichia coli (A)  Final    Comment: Cefazolin <=4 ug/mL Cefazolin with an MIC <=16 predicts susceptibility to the oral agents cefaclor, cefdinir, cefpodoxime, cefprozil, cefuroxime, cephalexin, and loracarbef when used for therapy of uncomplicated urinary tract infections due to E. coli, Klebsiella pneumoniae, and Proteus mirabilis. Greater than 100,000 colony forming units per mL    Antimicrobial Susceptibility Comment  Final    Comment:       ** S =  Susceptible; I = Intermediate; R = Resistant **                    P = Positive; N = Negative             MICS are expressed in micrograms per mL    Antibiotic                 RSLT#1    RSLT#2    RSLT#3    RSLT#4 Amoxicillin/Clavulanic Acid    S Ampicillin                     S Cefepime                       S Ceftriaxone                    S Cefuroxime                     S Ciprofloxacin                  S Ertapenem                      S Gentamicin                     S Imipenem                       S Levofloxacin                   S Meropenem                      S Nitrofurantoin                 S Piperacillin/Tazobactam        S Tetracycline                   S Tobramycin                     S Trimethoprim/Sulfa             S     Coagulation Studies: No results for input(s): "LABPROT", "INR" in the last 72 hours.   Urinalysis: No results for input(s): "COLORURINE", "LABSPEC", "PHURINE", "GLUCOSEU", "HGBUR", "BILIRUBINUR", "KETONESUR", "PROTEINUR", "UROBILINOGEN", "NITRITE", "LEUKOCYTESUR" in the last 72 hours.  Invalid input(s): "  APPERANCEUR"    Imaging: No results found.   Medications:    anticoagulant sodium citrate     methocarbamol (ROBAXIN) IV      aspirin EC  325 mg Oral Q breakfast   Chlorhexidine Gluconate Cloth  6 each Topical Q0600   clopidogrel  75 mg Oral Daily   docusate sodium  100 mg Oral BID   feeding supplement (NEPRO CARB STEADY)  237 mL Oral TID BM   ferrous sulfate  325 mg Oral Q breakfast   furosemide  40 mg Oral Daily   glipiZIDE  2.5 mg Oral Q1200   insulin aspart  0-6 Units Subcutaneous TID WC   lactulose  30 g Oral BID   levothyroxine  100 mcg Oral q morning   midodrine  5 mg Oral TID WC   polyethylene glycol  17 g Oral BID   senna  1 tablet Oral BID   sotalol  80 mg Oral BID   traMADol  50 mg Oral Q6H   acetaminophen, alteplase, alum & mag hydroxide-simeth, anticoagulant sodium citrate, bisacodyl, fluticasone,  HYDROcodone-acetaminophen, HYDROmorphone (DILAUDID) injection, lidocaine (PF), lidocaine-prilocaine, menthol-cetylpyridinium **OR** phenol, methocarbamol **OR** methocarbamol (ROBAXIN) IV, morphine injection, ondansetron **OR** ondansetron (ZOFRAN) IV, pentafluoroprop-tetrafluoroeth, sodium phosphate  Assessment/ Plan:  [name] is a 89 y.o.  male with end stage renal disease on hemodialysis, coronary artery disease status post CABG, hypotension, hypothyroidism, congestive heart failure, diabetes mellitus type II who is admitted to [facility] on 11/12/2022 for Hip fracture (HCC) [S72.009A] End-stage renal disease on hemodialysis (HCC) [N18.6, Z99.2] Fall, initial encounter [W19.XXXA] Intertrochanteric fracture of right femur, closed, initial encounter (HCC) [S72.141A]  Underwent right hip surgery by Dr. Martha Clan on 6/21.   [facility] MWF [address] RIJ permcath 98.5kg   End stage renal disease on hemodialysis.  Dialysis yesterday with UF 1L achieved. Next treatment scheduled for Friday. Discharge plan to include rehab at [facility] at discharge.  2. Anemia of chronic kidney disease Lab Results  Component Value Date   HGB 8.7 (L) 11/18/2022    Patient receives Mircera at outpatient clinic.  Hemoglobin within desired range.  3. Secondary Hyperparathyroidism: with hip fracture.   Lab Results  Component Value Date   CALCIUM 8.2 (L) 11/18/2022   CAION 1.16 04/15/2021   PHOS 5.8 (H) 11/15/2022  Bone minerals within desired range Previously on calcium carbonate with meals.   4.  Hypotension: midodrine before dialysis treatments. On metoprolol and furosemide on nondialysis days. Midodrine ordered during dialysis.  Blood pressure stable  5. Hyperkalemia, Corrected to 4.8.    LOS: 6   6/27/202412:27 PM

## 2022-11-18 NOTE — Progress Notes (Signed)
Subjective:  POD #6 s/p medullary fixation for right intertrochanteric hip fracture.   Patient reports hip pain as moderate as he just finished physical therapy.  He is sitting up in a chair.  He states he needs to use the restroom.  Objective:   VITALS:   Vitals:   11/17/22 1737 11/17/22 1744 11/18/22 0100 11/18/22 0831  BP: (!) 108/43  (!) 115/49 (!) 101/49  Pulse: 61  64 64  Resp: 16  18 17   Temp: 97.7 F (36.5 C)  98.8 F (37.1 C) 99.2 F (37.3 C)  TempSrc: Oral  Oral   SpO2: 98%  96% 97%  Weight:  99.1 kg    Height:        PHYSICAL EXAM: Right lower extremity Neurovascular intact Sensation intact distally Intact pulses distally Dorsiflexion/Plantar flexion intact Incision: moderate serous drainage the proximal honeycomb dressing.  There is scant sanguinous drainage on the distal honeycomb dressings No cellulitis present Compartment soft  LABS  Results for orders placed or performed during the hospital encounter of 11/12/22 (from the past 24 hour(s))  Glucose, capillary     Status: None   Collection Time: 11/17/22  6:21 PM  Result Value Ref Range   Glucose-Capillary 90 70 - 99 mg/dL  Glucose, capillary     Status: Abnormal   Collection Time: 11/17/22  9:06 PM  Result Value Ref Range   Glucose-Capillary 115 (H) 70 - 99 mg/dL  CBC     Status: Abnormal   Collection Time: 11/18/22  5:43 AM  Result Value Ref Range   WBC 5.7 4.0 - 10.5 K/uL   RBC 2.66 (L) 4.22 - 5.81 MIL/uL   Hemoglobin 8.7 (L) 13.0 - 17.0 g/dL   HCT 62.9 (L) 52.8 - 41.3 %   MCV 103.0 (H) 80.0 - 100.0 fL   MCH 32.7 26.0 - 34.0 pg   MCHC 31.8 30.0 - 36.0 g/dL   RDW 24.4 01.0 - 27.2 %   Platelets 132 (L) 150 - 400 K/uL   nRBC 0.0 0.0 - 0.2 %  Basic metabolic panel     Status: Abnormal   Collection Time: 11/18/22  5:43 AM  Result Value Ref Range   Sodium 135 135 - 145 mmol/L   Potassium 4.8 3.5 - 5.1 mmol/L   Chloride 96 (L) 98 - 111 mmol/L   CO2 30 22 - 32 mmol/L   Glucose, Bld 120 (H) 70  - 99 mg/dL   BUN 39 (H) 8 - 23 mg/dL   Creatinine, Ser 5.36 (H) 0.61 - 1.24 mg/dL   Calcium 8.2 (L) 8.9 - 10.3 mg/dL   GFR, Estimated 25 (L) >60 mL/min   Anion gap 9 5 - 15  Glucose, capillary     Status: Abnormal   Collection Time: 11/18/22 10:13 AM  Result Value Ref Range   Glucose-Capillary 117 (H) 70 - 99 mg/dL  Glucose, capillary     Status: Abnormal   Collection Time: 11/18/22 11:50 AM  Result Value Ref Range   Glucose-Capillary 105 (H) 70 - 99 mg/dL    No results found.  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  Case management notes indicate that the patient will be discharged to rehab tomorrow for hemodialysis in the morning.  Patient is ready to go for an orthopedic standpoint and will follow-up at [facility] in [place] in 10 to 14 days after discharge.  He is weightbearing as tolerated on the right lower extremity and should continue physical therapy for right hip range of motion, lower extremity strengthening and gait training until follow-up.  The patient will remain on Plavix and aspirin for DVT prophylaxis until follow-up.    Juanell Fairly , MD 11/18/2022, 3:38 PM

## 2022-11-18 NOTE — Progress Notes (Signed)
PT Cancellation Note  Patient Details Name: Matthew [name] MRN: [id number] DOB: 1930-08-18   Cancelled Treatment:     Several attempts made this morning to work with pt. 2 x pt on BSC and 1 x pt eating. Will return this afternoon and continue to progress per current POC.    Rushie Chestnut 11/18/2022, 3:32 PM

## 2022-11-18 NOTE — Progress Notes (Signed)
Physical Therapy Treatment Patient Details Name: Matthew [name] MRN: [id number] DOB: Sep 21, 1930 Today's Date: 11/18/2022   History of Present Illness Matthew [name] is a 89 y.o. male with medical history significant of multiple medical issues including atrial fibrillation, HFrEF, coronary artery disease, end-stage renal disease on hemodialysis Monday Wednesday Friday, GERD, hypertension, hyperlipidemia, type 2 diabetes presenting with fall and right hip fracture. He is s/p ORIF,11/12/22; He typically gets HD on MWF however due to surgery yesterday will be getting Dialysis today, Saturday 11/13/22.    PT Comments    Pt was sitting in recliner upon arrival. His supportive daughter was at bedside. Pt is highly motivated and cooperative throughout. Still struggles with wt acceptance on RLE. Only able to clear LLE from floor 2 x. Pt puts forth great effort. Re-issued HEP handout and had pt perform all exercises (see listed below). Overall tolerated session well but will benefit from continued skilled PT to maximize independence and safety with all ADLs.    Recommendations for follow up therapy are one component of a multi-disciplinary discharge planning process, led by the attending physician.  Recommendations may be updated based on patient status, additional functional criteria and insurance authorization.     Assistance Recommended at Discharge Frequent or constant Supervision/Assistance  Patient can return home with the following A lot of help with walking and/or transfers;A lot of help with bathing/dressing/bathroom   Equipment Recommendations  Other (comment) (Defer to next level of care)       Precautions / Restrictions Precautions Precautions: Fall Restrictions Weight Bearing Restrictions: Yes RLE Weight Bearing: Weight bearing as tolerated     Mobility  Bed Mobility  General bed mobility comments: pt was seated in recliner pre/post session. Supportive daughter at bedside     Transfers Overall transfer level: Needs assistance Equipment used: Rolling walker (2 wheels) Transfers: Sit to/from Stand Sit to Stand: Min assist, Mod assist  General transfer comment: pt stood 4 x from HD recliner to RW with mod assist at first that progressed to min assist only. Vcs for technique improvements.    Ambulation/Gait  General Gait Details: Pt continues to struggle with wt acceptance on RLE to allow LLE advancement. Easily able to clear RLE form floor. only able to clear LLE 2 x prior to pain and fatigue limiting.   Balance Overall balance assessment: Needs assistance Sitting-balance support: Feet supported, Bilateral upper extremity supported Sitting balance-Leahy Scale: Good     Standing balance support: Bilateral upper extremity supported, During functional activity, Reliant on assistive device for balance Standing balance-Leahy Scale: Fair       Cognition Arousal/Alertness: Awake/alert Behavior During Therapy: WFL for tasks assessed/performed Overall Cognitive Status: Within Functional Limits for tasks assessed   General Comments: Pt is A and O x 4        Exercises General Exercises - Lower Extremity Ankle Circles/Pumps: AROM, 10 reps Quad Sets: AROM, 10 reps Gluteal Sets: 10 reps Heel Slides: AROM, 10 reps Hip ABduction/ADduction: AROM, 10 reps Straight Leg Raises: AAROM, 10 reps    General Comments General comments (skin integrity, edema, etc.): Pt was re-issued HEP handout and pt perform.      Pertinent Vitals/Pain Pain Assessment Pain Assessment: 0-10 Pain Score: 4  Faces Pain Scale: Hurts a little bit Pain Location: right hip - no pain at rest Pain Descriptors / Indicators: Aching, Sore Pain Intervention(s): Limited activity within patient's tolerance, Monitored during session, Premedicated before session, Repositioned     PT Goals (current goals can now be  found in the care plan section) Acute Rehab PT Goals Patient Stated Goal: Rehab  at [facility] then home Progress towards PT goals: Progressing toward goals    Frequency    BID      PT Plan Current plan remains appropriate    Co-evaluation     PT goals addressed during session: Mobility/safety with mobility;Balance;Proper use of DME;Strengthening/ROM        AM-PAC PT "6 Clicks" Mobility   Outcome Measure  Help needed turning from your back to your side while in a flat bed without using bedrails?: A Little Help needed moving from lying on your back to sitting on the side of a flat bed without using bedrails?: A Little Help needed moving to and from a bed to a chair (including a wheelchair)?: A Lot Help needed standing up from a chair using your arms (e.g., wheelchair or bedside chair)?: A Lot Help needed to walk in hospital room?: A Lot Help needed climbing 3-5 steps with a railing? : Total 6 Click Score: 13    End of Session Equipment Utilized During Treatment: Gait belt Activity Tolerance: Patient tolerated treatment well Patient left: in chair;with call bell/phone within reach;with chair alarm set;with family/visitor present Nurse Communication: Mobility status PT Visit Diagnosis: Muscle weakness (generalized) (M62.81);History of falling (Z91.81);Difficulty in walking, not elsewhere classified (R26.2);Pain Pain - Right/Left: Right Pain - part of body: Hip     Time: 1440-1505 PT Time Calculation (min) (ACUTE ONLY): 25 min  Charges:  $Gait Training: 8-22 mins $Therapeutic Exercise: 8-22 mins                     Jetta Lout PTA 11/18/22, 3:31 PM

## 2022-11-19 DIAGNOSIS — I480 Paroxysmal atrial fibrillation: Secondary | ICD-10-CM | POA: Diagnosis not present

## 2022-11-19 DIAGNOSIS — S72141D Displaced intertrochanteric fracture of right femur, subsequent encounter for closed fracture with routine healing: Secondary | ICD-10-CM | POA: Diagnosis not present

## 2022-11-19 DIAGNOSIS — J309 Allergic rhinitis, unspecified: Secondary | ICD-10-CM | POA: Diagnosis not present

## 2022-11-19 DIAGNOSIS — Z7982 Long term (current) use of aspirin: Secondary | ICD-10-CM | POA: Diagnosis not present

## 2022-11-19 DIAGNOSIS — Z87891 Personal history of nicotine dependence: Secondary | ICD-10-CM | POA: Diagnosis not present

## 2022-11-19 DIAGNOSIS — U071 COVID-19: Secondary | ICD-10-CM | POA: Diagnosis not present

## 2022-11-19 DIAGNOSIS — D631 Anemia in chronic kidney disease: Secondary | ICD-10-CM | POA: Diagnosis not present

## 2022-11-19 DIAGNOSIS — K5900 Constipation, unspecified: Secondary | ICD-10-CM | POA: Diagnosis not present

## 2022-11-19 DIAGNOSIS — F1090 Alcohol use, unspecified, uncomplicated: Secondary | ICD-10-CM | POA: Diagnosis not present

## 2022-11-19 DIAGNOSIS — R52 Pain, unspecified: Secondary | ICD-10-CM | POA: Diagnosis not present

## 2022-11-19 DIAGNOSIS — Z7401 Bed confinement status: Secondary | ICD-10-CM | POA: Diagnosis not present

## 2022-11-19 DIAGNOSIS — I252 Old myocardial infarction: Secondary | ICD-10-CM | POA: Diagnosis not present

## 2022-11-19 DIAGNOSIS — R600 Localized edema: Secondary | ICD-10-CM | POA: Diagnosis not present

## 2022-11-19 DIAGNOSIS — I10 Essential (primary) hypertension: Secondary | ICD-10-CM | POA: Diagnosis not present

## 2022-11-19 DIAGNOSIS — I5022 Chronic systolic (congestive) heart failure: Secondary | ICD-10-CM | POA: Diagnosis not present

## 2022-11-19 DIAGNOSIS — W19XXXD Unspecified fall, subsequent encounter: Secondary | ICD-10-CM | POA: Diagnosis not present

## 2022-11-19 DIAGNOSIS — E1121 Type 2 diabetes mellitus with diabetic nephropathy: Secondary | ICD-10-CM | POA: Diagnosis not present

## 2022-11-19 DIAGNOSIS — E785 Hyperlipidemia, unspecified: Secondary | ICD-10-CM | POA: Diagnosis not present

## 2022-11-19 DIAGNOSIS — M1990 Unspecified osteoarthritis, unspecified site: Secondary | ICD-10-CM | POA: Diagnosis not present

## 2022-11-19 DIAGNOSIS — D509 Iron deficiency anemia, unspecified: Secondary | ICD-10-CM | POA: Diagnosis not present

## 2022-11-19 DIAGNOSIS — R601 Generalized edema: Secondary | ICD-10-CM | POA: Diagnosis not present

## 2022-11-19 DIAGNOSIS — E039 Hypothyroidism, unspecified: Secondary | ICD-10-CM | POA: Diagnosis not present

## 2022-11-19 DIAGNOSIS — Z85828 Personal history of other malignant neoplasm of skin: Secondary | ICD-10-CM | POA: Diagnosis not present

## 2022-11-19 DIAGNOSIS — S72001S Fracture of unspecified part of neck of right femur, sequela: Secondary | ICD-10-CM | POA: Diagnosis not present

## 2022-11-19 DIAGNOSIS — Z992 Dependence on renal dialysis: Secondary | ICD-10-CM | POA: Diagnosis not present

## 2022-11-19 DIAGNOSIS — S32511A Fracture of superior rim of right pubis, initial encounter for closed fracture: Secondary | ICD-10-CM | POA: Diagnosis not present

## 2022-11-19 DIAGNOSIS — E7800 Pure hypercholesterolemia, unspecified: Secondary | ICD-10-CM | POA: Diagnosis not present

## 2022-11-19 DIAGNOSIS — I130 Hypertensive heart and chronic kidney disease with heart failure and stage 1 through stage 4 chronic kidney disease, or unspecified chronic kidney disease: Secondary | ICD-10-CM | POA: Diagnosis not present

## 2022-11-19 DIAGNOSIS — E875 Hyperkalemia: Secondary | ICD-10-CM | POA: Diagnosis not present

## 2022-11-19 DIAGNOSIS — N186 End stage renal disease: Secondary | ICD-10-CM | POA: Diagnosis not present

## 2022-11-19 DIAGNOSIS — D638 Anemia in other chronic diseases classified elsewhere: Secondary | ICD-10-CM | POA: Diagnosis not present

## 2022-11-19 DIAGNOSIS — E559 Vitamin D deficiency, unspecified: Secondary | ICD-10-CM | POA: Diagnosis not present

## 2022-11-19 DIAGNOSIS — I2510 Atherosclerotic heart disease of native coronary artery without angina pectoris: Secondary | ICD-10-CM | POA: Diagnosis not present

## 2022-11-19 DIAGNOSIS — I959 Hypotension, unspecified: Secondary | ICD-10-CM | POA: Diagnosis not present

## 2022-11-19 DIAGNOSIS — B029 Zoster without complications: Secondary | ICD-10-CM | POA: Diagnosis not present

## 2022-11-19 DIAGNOSIS — I951 Orthostatic hypotension: Secondary | ICD-10-CM | POA: Diagnosis not present

## 2022-11-19 DIAGNOSIS — K219 Gastro-esophageal reflux disease without esophagitis: Secondary | ICD-10-CM | POA: Diagnosis not present

## 2022-11-19 DIAGNOSIS — E8779 Other fluid overload: Secondary | ICD-10-CM | POA: Diagnosis not present

## 2022-11-19 DIAGNOSIS — E1122 Type 2 diabetes mellitus with diabetic chronic kidney disease: Secondary | ICD-10-CM | POA: Diagnosis not present

## 2022-11-19 DIAGNOSIS — N250 Renal osteodystrophy: Secondary | ICD-10-CM | POA: Diagnosis not present

## 2022-11-19 DIAGNOSIS — N2581 Secondary hyperparathyroidism of renal origin: Secondary | ICD-10-CM | POA: Diagnosis not present

## 2022-11-19 LAB — GLUCOSE, CAPILLARY
Glucose-Capillary: 58 mg/dL — ABNORMAL LOW (ref 70–99)
Glucose-Capillary: 58 mg/dL — ABNORMAL LOW (ref 70–99)
Glucose-Capillary: 59 mg/dL — ABNORMAL LOW (ref 70–99)
Glucose-Capillary: 62 mg/dL — ABNORMAL LOW (ref 70–99)
Glucose-Capillary: 60 mg/dL — ABNORMAL LOW (ref 70–99)
Glucose-Capillary: 64 mg/dL — ABNORMAL LOW (ref 70–99)
Glucose-Capillary: 78 mg/dL (ref 70–99)
Glucose-Capillary: 110 mg/dL — ABNORMAL HIGH (ref 70–99)
Glucose-Capillary: 111 mg/dL — ABNORMAL HIGH (ref 70–99)

## 2022-11-19 LAB — CBC
HCT: 27.6 % — ABNORMAL LOW (ref 39.0–52.0)
Hemoglobin: 8.9 g/dL — ABNORMAL LOW (ref 13.0–17.0)
MCH: 33 pg (ref 26.0–34.0)
MCHC: 32.2 g/dL (ref 30.0–36.0)
MCV: 102.2 fL — ABNORMAL HIGH (ref 80.0–100.0)
Platelets: 147 10*3/uL — ABNORMAL LOW (ref 150–400)
RBC: 2.7 MIL/uL — ABNORMAL LOW (ref 4.22–5.81)
RDW: 14.9 % (ref 11.5–15.5)
WBC: 6.4 10*3/uL (ref 4.0–10.5)
nRBC: 0 % (ref 0.0–0.2)

## 2022-11-19 LAB — BASIC METABOLIC PANEL
Anion gap: 11 (ref 5–15)
BUN: 58 mg/dL — ABNORMAL HIGH (ref 8–23)
CO2: 28 mmol/L (ref 22–32)
Calcium: 8.6 mg/dL — ABNORMAL LOW (ref 8.9–10.3)
Chloride: 96 mmol/L — ABNORMAL LOW (ref 98–111)
Creatinine, Ser: 2.98 mg/dL — ABNORMAL HIGH (ref 0.61–1.24)
GFR, Estimated: 19 mL/min — ABNORMAL LOW (ref >60)
Glucose, Bld: 81 mg/dL (ref 70–99)
Potassium: 4.1 mmol/L (ref 3.5–5.1)
Sodium: 135 mmol/L (ref 135–145)

## 2022-11-19 MED ORDER — TRAMADOL HCL 50 MG PO TABS: 50.0000 mg | ORAL_TABLET | Freq: Four times a day (QID) | ORAL | 0 refills | Status: AC | PRN

## 2022-11-19 MED ORDER — DEXTROSE 50 % IV SOLN: 25.0000 g | Freq: Once | INTRAVENOUS | Status: DC

## 2022-11-19 MED ORDER — ASPIRIN 325 MG PO TBEC: 325.0000 mg | DELAYED_RELEASE_TABLET | Freq: Every day | ORAL | 0 refills | Status: AC

## 2022-11-19 MED ORDER — TRAMADOL HCL 50 MG PO TABS: 50.0000 mg | ORAL_TABLET | Freq: Four times a day (QID) | ORAL | 0 refills | Status: DC | PRN

## 2022-11-19 NOTE — TOC Progression Note (Signed)
Transition of Care [facility]) - Progression Note    Patient Details  Name: Matthew [name] MRN: [id number] Date of Birth: [birth year]  Transition of Care [facility]) CM/SW Contact  Liliana Cline, LCSW Phone Number: 11/19/2022, 11:44 AM  Clinical Narrative:    Elmarie Shiley at [facility] confirms patient can come to [facility] after finishing dialysis today, will just need DC summary by 12 noon. MD notified.    Expected Discharge Plan: Skilled Nursing Facility Barriers to Discharge: Continued Medical Work up  Expected Discharge Plan and Services     Post Acute Care Choice: Skilled Nursing Facility Living arrangements for the past 2 months: Single Family Home                                       Social Determinants of Health (SDOH) Interventions SDOH Screenings   Food Insecurity: No Food Insecurity (11/12/2022)  Housing: Low Risk  (11/12/2022)  Transportation Needs: No Transportation Needs (11/12/2022)  Utilities: Not At Risk (11/12/2022)  Depression (PHQ2-9): Low Risk  (09/21/2022)  Financial Resource Strain: Low Risk  (01/10/2018)  Physical Activity: Inactive (01/10/2018)  Social Connections: Socially Integrated (01/10/2018)  Stress: No Stress Concern Present (01/10/2018)  Tobacco Use: Medium Risk (11/12/2022)    Readmission Risk Interventions    02/12/2021    3:28 PM  Readmission Risk Prevention Plan  Transportation Screening Complete  Medication Review [employer]) Complete  SW Recovery Care/Counseling Consult Complete  Skilled Nursing Facility Complete

## 2022-11-19 NOTE — Plan of Care (Signed)
  Problem: Education: Goal: Knowledge of General Education information will improve Description: Including pain rating scale, medication(s)/side effects and non-pharmacologic comfort measures Outcome: Progressing   Problem: Health Behavior/Discharge Planning: Goal: Ability to manage health-related needs will improve Outcome: Progressing   Problem: Clinical Measurements: Goal: Ability to maintain clinical measurements within normal limits will improve Outcome: Progressing Goal: Will remain free from infection Outcome: Progressing Goal: Diagnostic test results will improve Outcome: Progressing Goal: Respiratory complications will improve Outcome: Progressing Goal: Cardiovascular complication will be avoided Outcome: Progressing   Problem: Activity: Goal: Risk for activity intolerance will decrease Outcome: Progressing   Problem: Nutrition: Goal: Adequate nutrition will be maintained Outcome: Progressing   Problem: Coping: Goal: Level of anxiety will decrease Outcome: Progressing   Problem: Elimination: Goal: Will not experience complications related to bowel motility Outcome: Progressing Goal: Will not experience complications related to urinary retention Outcome: Progressing   Problem: Pain Managment: Goal: General experience of comfort will improve Outcome: Progressing   Problem: Safety: Goal: Ability to remain free from injury will improve Outcome: Progressing   Problem: Skin Integrity: Goal: Risk for impaired skin integrity will decrease Outcome: Progressing   Problem: Education: Goal: Ability to describe self-care measures that may prevent or decrease complications (Diabetes Survival Skills Education) will improve Outcome: Progressing Goal: Individualized Educational Video(s) Outcome: Progressing   Problem: Coping: Goal: Ability to adjust to condition or change in health will improve Outcome: Progressing   Problem: Fluid Volume: Goal: Ability to  maintain a balanced intake and output will improve Outcome: Progressing   Problem: Health Behavior/Discharge Planning: Goal: Ability to identify and utilize available resources and services will improve Outcome: Progressing Goal: Ability to manage health-related needs will improve Outcome: Progressing   Problem: Metabolic: Goal: Ability to maintain appropriate glucose levels will improve Outcome: Progressing   Problem: Nutritional: Goal: Maintenance of adequate nutrition will improve Outcome: Progressing Goal: Progress toward achieving an optimal weight will improve Outcome: Progressing   Problem: Skin Integrity: Goal: Risk for impaired skin integrity will decrease Outcome: Progressing   Problem: Tissue Perfusion: Goal: Adequacy of tissue perfusion will improve Outcome: Progressing   Problem: Education: Goal: Verbalization of understanding the information provided (i.e., activity precautions, restrictions, etc) will improve Outcome: Progressing Goal: Individualized Educational Video(s) Outcome: Progressing   Problem: Activity: Goal: Ability to ambulate and perform ADLs will improve Outcome: Progressing   Problem: Clinical Measurements: Goal: Postoperative complications will be avoided or minimized Outcome: Progressing   Problem: Self-Concept: Goal: Ability to maintain and perform role responsibilities to the fullest extent possible will improve Outcome: Progressing   Problem: Pain Management: Goal: Pain level will decrease Outcome: Progressing   

## 2022-11-19 NOTE — Progress Notes (Signed)
Pt in transport since 0700am

## 2022-11-19 NOTE — Progress Notes (Signed)
[facility]  ROUNDING NOTE   Subjective:   Matthew [name] is an 89 year old male with medical concerns including CAD status post CABG, hypothyroidism, hypertension, CHF, type 2 diabetes, and end-stage renal disease on hemodialysis. He presents to the ED after suffering a fall and rotation and shortened right leg. He has been admitted for Hip fracture  [facility]) [S72.009A] End-stage renal disease on hemodialysis (HCC) [N18.6, Z99.2] Fall, initial encounter [W19.XXXA] Intertrochanteric fracture of right femur, closed, initial encounter (HCC) [S72.141A]  Patient seen and evaluated during dialysis   HEMODIALYSIS FLOWSHEET:  Blood Flow Rate (mL/min): 400 mL/min Arterial Pressure (mmHg): -170 mmHg Venous Pressure (mmHg): 210 mmHg TMP (mmHg): 7 mmHg Ultrafiltration Rate (mL/min): 685 mL/min Dialysate Flow Rate (mL/min): 300 ml/min Dialysis Fluid Bolus: Normal Saline Bolus Amount (mL): 100 mL  Tolerating treatment seated in chair  Objective:  Vital signs in last 24 hours:  Temp:  [97.5 F (36.4 C)-98.1 F (36.7 C)] 97.5 F (36.4 C) (06/28 1000) Pulse Rate:  [58-66] 62 (06/28 1300) Resp:  [10-18] 14 (06/28 1300) BP: (88-135)/(40-71) 99/40 (06/28 1303) SpO2:  [95 %-100 %] 100 % (06/28 1300) Weight:  [100.4 kg] 100.4 kg (06/28 1000)  Weight change:  Filed Weights   11/15/22 1203 11/17/22 1744 11/19/22 1000  Weight: 96.9 kg 99.1 kg 100.4 kg    Intake/Output: I/O last 3 completed shifts: In: 240 [P.O.:240] Out: 300 [Urine:300]   Intake/Output this shift:  No intake/output data recorded.  Physical Exam: General: NAD  Head: Normocephalic, atraumatic. Moist oral mucosal membranes  Eyes: Anicteric  Lungs:  Clear to auscultation  Heart: Regular rate and rhythm  Abdomen:  Soft, nontender  Extremities:  No peripheral edema, right hip with clean incision  Neurologic: Nonfocal, moving all four extremities  Skin: No lesions  Access: Left AVF    Basic Metabolic  Panel: Recent Labs  Lab 11/13/22 0522 11/15/22 0832 11/16/22 0450 11/16/22 1300 11/17/22 0436 11/18/22 0543 11/19/22 0610  NA 135 132*  --   --  133* 135 135  K 4.9 5.9* 5.7* 6.5* 5.7* 4.8 4.1  CL 107 95*  --   --  95* 96* 96*  CO2 18* 26  --   --  30 30 28   GLUCOSE 155* 127*  --   --  139* 120* 81  BUN 85* 82*  --   --  55* 39* 58*  CREATININE 3.72* 3.72*  --   --  3.32* 2.38* 2.98*  CALCIUM 7.2* 8.4*  --   --  8.6* 8.2* 8.6*  PHOS  --  5.8*  --   --   --   --   --      Liver Function Tests: Recent Labs  Lab 11/13/22 0522 11/15/22 0832  AST 35  --   ALT 30  --   ALKPHOS 103  --   BILITOT 0.6  --   PROT 5.2*  --   ALBUMIN 2.4* 2.9*    No results for input(s): "LIPASE", "AMYLASE" in the last 168 hours. No results for input(s): "AMMONIA" in the last 168 hours.  CBC: Recent Labs  Lab 11/13/22 0656 11/14/22 0856 11/16/22 0450 11/17/22 0436 11/18/22 0543 11/19/22 0610  WBC 9.7 8.1  --  6.4 5.7 6.4  HGB 10.9* 9.8* 9.0* 8.6* 8.7* 8.9*  HCT 33.1* 29.6*  --  26.6* 27.4* 27.6*  MCV 98.5 98.7  --  100.0 103.0* 102.2*  PLT 103* 100*  --  127* 132* 147*     Cardiac Enzymes:  No results for input(s): "CKTOTAL", "CKMB", "CKMBINDEX", "TROPONINI" in the last 168 hours.  BNP: Invalid input(s): "POCBNP"  CBG: Recent Labs  Lab 11/19/22 0824 11/19/22 0830 11/19/22 0845 11/19/22 0906 11/19/22 1027  GLUCAP 58* 60* 64* 78 110*     Microbiology: Results for orders placed or performed in visit on 10/15/22  Culture, Urine     Status: Abnormal   Collection Time: 10/15/22  2:05 PM   Specimen: Urine   UC  Result Value Ref Range Status   Urine Culture, Routine Final report (A)  Final   Organism ID, Bacteria Escherichia coli (A)  Final    Comment: Cefazolin <=4 ug/mL Cefazolin with an MIC <=16 predicts susceptibility to the oral agents cefaclor, cefdinir, cefpodoxime, cefprozil, cefuroxime, cephalexin, and loracarbef when used for therapy of uncomplicated urinary  tract infections due to E. coli, Klebsiella pneumoniae, and Proteus mirabilis. Greater than 100,000 colony forming units per mL    Antimicrobial Susceptibility Comment  Final    Comment:       ** S = Susceptible; I = Intermediate; R = Resistant **                    P = Positive; N = Negative             MICS are expressed in micrograms per mL    Antibiotic                 RSLT#1    RSLT#2    RSLT#3    RSLT#4 Amoxicillin/Clavulanic Acid    S Ampicillin                     S Cefepime                       S Ceftriaxone                    S Cefuroxime                     S Ciprofloxacin                  S Ertapenem                      S Gentamicin                     S Imipenem                       S Levofloxacin                   S Meropenem                      S Nitrofurantoin                 S Piperacillin/Tazobactam        S Tetracycline                   S Tobramycin                     S Trimethoprim/Sulfa             S     Coagulation Studies: No results for input(s): "LABPROT", "INR" in the last 72 hours.   Urinalysis: No results for input(s): "COLORURINE", "LABSPEC", "  PHURINE", "GLUCOSEU", "HGBUR", "BILIRUBINUR", "KETONESUR", "PROTEINUR", "UROBILINOGEN", "NITRITE", "LEUKOCYTESUR" in the last 72 hours.  Invalid input(s): "APPERANCEUR"    Imaging: No results found.   Medications:    anticoagulant sodium citrate     methocarbamol (ROBAXIN) IV      aspirin EC  325 mg Oral Q breakfast   Chlorhexidine Gluconate Cloth  6 each Topical Q0600   clopidogrel  75 mg Oral Daily   dextrose  25 g Intravenous Once   docusate sodium  100 mg Oral BID   feeding supplement (NEPRO CARB STEADY)  237 mL Oral TID BM   ferrous sulfate  325 mg Oral Q breakfast   furosemide  40 mg Oral Daily   insulin aspart  0-6 Units Subcutaneous TID WC   lactulose  30 g Oral BID   levothyroxine  100 mcg Oral q morning   midodrine  5 mg Oral TID WC   polyethylene glycol  17 g Oral BID    senna  1 tablet Oral BID   sotalol  80 mg Oral BID   traMADol  50 mg Oral Q6H   acetaminophen, alteplase, alum & mag hydroxide-simeth, anticoagulant sodium citrate, bisacodyl, fluticasone, HYDROcodone-acetaminophen, HYDROmorphone (DILAUDID) injection, lidocaine (PF), lidocaine-prilocaine, menthol-cetylpyridinium **OR** phenol, methocarbamol **OR** methocarbamol (ROBAXIN) IV, morphine injection, ondansetron **OR** ondansetron (ZOFRAN) IV, pentafluoroprop-tetrafluoroeth, sodium phosphate  Assessment/ Plan:  [name] is a 89 y.o.  male with end stage renal disease on hemodialysis, coronary artery disease status post CABG, hypotension, hypothyroidism, congestive heart failure, diabetes mellitus type II who is admitted to [facility] on 11/12/2022 for Hip fracture (HCC) [S72.009A] End-stage renal disease on hemodialysis (HCC) [N18.6, Z99.2] Fall, initial encounter [W19.XXXA] Intertrochanteric fracture of right femur, closed, initial encounter (HCC) [S72.141A]  Underwent right hip surgery by Dr. Martha Clan on 6/21.   [facility] MWF [address] RIJ permcath 98.5kg   End stage renal disease on hemodialysis.  Receiving dialysis today, UF goal 1L. Discharge plan to include rehab at [facility] at discharge. Next treatment scheduled for Monday  2. Anemia of chronic kidney disease Lab Results  Component Value Date   HGB 8.9 (L) 11/19/2022    Patient receives Mircera at outpatient clinic.  Hemoglobin stable  3. Secondary Hyperparathyroidism: with hip fracture.   Lab Results  Component Value Date   CALCIUM 8.6 (L) 11/19/2022   CAION 1.16 04/15/2021   PHOS 5.8 (H) 11/15/2022  Phosphorus slightly elevated, will continue to monitor Previously on calcium carbonate with meals.   4.  Hypotension: midodrine before dialysis treatments. On metoprolol and furosemide on nondialysis days. Midodrine ordered during dialysis.  Blood pressure soft during dialysis.  5. Hyperkalemia, Corrected    LOS:  7   6/28/20241:29 PM

## 2022-11-19 NOTE — Progress Notes (Signed)
Hemodialysis note  Received patient in bed to unit. Alert and oriented.  Informed consent signed and in chart.  Treatment initiated: 1004 Treatment completed: 1337  Patient tolerated well. Transported back to room, alert without acute distress.  Report given to patient's RN.   Access used: LUA AVF Access issues: none  Total UF removed: 1500 ml Medication(s) given:  none  Post HD weight: 99.9 kg   [facility] [name] [name]

## 2022-11-19 NOTE — Progress Notes (Addendum)
0745: delay in patient's transport to Dialysis. Floor RN called due to low blood glucose and is currently doing interventions.

## 2022-11-19 NOTE — Progress Notes (Signed)
Changed patient into paper gown and obtained last set of vitals. EMS is here to get patient. Currently alert and oriented x4. On stretcher to [facility] via EMS.

## 2022-11-19 NOTE — Care Management Important Message (Signed)
Important Message  Patient Details  Name: Matthew [name] MRN: [id number] Date of Birth: [birth year]   Medicare Important Message Given:  Yes     Olegario Messier A Tavis Kring 11/19/2022, 12:45 PM

## 2022-11-19 NOTE — Progress Notes (Signed)
PT Cancellation Note  Patient Details Name: Matthew [name] MRN: [id number] DOB: 02-26-1931   Cancelled Treatment:     PT attempt. Pt already in HD chair eating breakfast. Blood sugar has been low this AM with RN staff aware and addressing concern. Plan id for HD this AM with possible DC to rehab afterwards. Author will continue to follow throughout the day and return after HD.    Rushie Chestnut 11/19/2022, 8:50 AM

## 2022-11-19 NOTE — Consult Note (Signed)
   [facility] CM Inpatient Consult   11/19/2022  Matthew [name] [birth year] [id number]  Follow up:  Disposition, Remote coverage for Matthew [name] [facility] Liaison on behalf of [facility] for post hospital readmission prevention measures  [facility] [[facility]]  Accountable Care Organization [ACO] Patient:  Medicare ACO REACH  Primary Care Provider:  Sherron Monday, MD with [facility]  Patient was screened for follow up hospitalization and on behalf of [facility] Care Coordination to assess for post hospital community care needs.  Patient is being considered for a skilled nursing facility level of care for post hospital transition.  If the patient goes to a [facility] affiliated facility then, patient can be followed by [facility] RN with traditional Medicare and approved Medicare Advantage plans.    Plan:   Will notify the Community [facility] RN can follow for any known or needs for transitional care needs for returning to post facility care coordination needs to return to community.  For questions or referrals, please contact:   Charlesetta Shanks, RN BSN CCM [facility]  [phone] business mobile phone Toll free office [phone]  *Concierge Line  [phone] Fax number: [phone] [email or web address]

## 2022-11-19 NOTE — Progress Notes (Addendum)
Gave report to [employer]. Right hand IV was taken out and in tact. Patient waiting for EMS transportation to [facility].

## 2022-11-19 NOTE — Inpatient Diabetes Management (Signed)
Inpatient Diabetes Program Recommendations  AACE/ADA: New Consensus Statement on Inpatient Glycemic Control   Target Ranges:  Prepandial:   less than 140 mg/dL      Peak postprandial:   less than 180 mg/dL (1-2 hours)      Critically ill patients:  140 - 180 mg/dL    Latest Reference Range & Units 11/18/22 10:13 11/18/22 11:50 11/18/22 17:33 11/18/22 21:02 11/19/22 07:42  Glucose-Capillary 70 - 99 mg/dL 409 (H) 811 (H) 914 (H) 119 (H) 58 (L)   Review of Glycemic Control  Diabetes history: DM2 Outpatient Diabetes medications: Glipizide 2.5 mg daily Current orders for Inpatient glycemic control: Glipizide 2.5 mg daily, Novolog 0-6 units TID with meals  Inpatient Diabetes Program Recommendations:    Oral DM medication: CBG 58 mg/dl today. Please consider discontinuing Glipizide while inpatient.  Thanks, Orlando Penner, RN, MSN, CDCES Diabetes Coordinator Inpatient Diabetes Program [phone] (Team Pager from 8am to 5pm)

## 2022-11-19 NOTE — TOC Transition Note (Addendum)
Transition of Care [facility]) - CM/SW Discharge Note   Patient Details  Name: Matthew [name] MRN: [id number] Date of Birth: 08-Aug-1930  Transition of Care [facility]) CM/SW Contact:  Liliana Cline, LCSW Phone Number: 11/19/2022, 11:55 AM   Clinical Narrative:    [facility] after HD treatment. Room 610. Confirmed with Admissions Worker Tiffany. Updated MD, RN, and patient's daughter. Asked RN to call report. EMS paperwork completed. Will call for [facility] when patient is back from dialysis and notified that patient is ready for transport.   3:02- [facility] called for transport. Patient is 4th on the list. RN notified.     Final next level of care: Skilled Nursing Facility Barriers to Discharge: Barriers Resolved   Patient Goals and CMS Choice CMS Medicare.gov Compare Post Acute Care list provided to:: Patient Represenative (must comment) Choice offered to / list presented to : Adult Children  Discharge Placement                Patient chooses bed at: [facility] Patient to be transferred to facility by: [facility] Name of family member notified: [email or web address]- [name] Patient and family notified of of transfer: 11/19/22  Discharge Plan and Services Additional resources added to the After Visit Summary for       Post Acute Care Choice: Skilled Nursing Facility                               Social Determinants of Health (SDOH) Interventions SDOH Screenings   Food Insecurity: No Food Insecurity (11/12/2022)  Housing: Low Risk  (11/12/2022)  Transportation Needs: No Transportation Needs (11/12/2022)  Utilities: Not At Risk (11/12/2022)  Depression (PHQ2-9): Low Risk  (09/21/2022)  Financial Resource Strain: Low Risk  (01/10/2018)  Physical Activity: Inactive (01/10/2018)  Social Connections: Socially Integrated (01/10/2018)  Stress: No Stress Concern Present (01/10/2018)  Tobacco Use: Medium Risk (11/12/2022)     Readmission Risk Interventions     02/12/2021    3:28 PM  Readmission Risk Prevention Plan  Transportation Screening Complete  Medication Review Oceanographer) Complete  SW Recovery Care/Counseling Consult Complete  Skilled Nursing Facility Complete

## 2022-11-19 NOTE — Discharge Summary (Addendum)
Physician Discharge Summary  Matthew [name] UJW:[id number] DOB: [birth year] DOA: 11/12/2022  PCP: Sherron Monday, MD  Admit date: 11/12/2022 Discharge date: 11/19/2022  Admitted From: home  Disposition:  SNF  Recommendations for Outpatient Follow-up:  Follow up with PCP in 1-2 weeks F/u w/ ortho surg, Dr. Martha Clan, in 10-14 days   Home Health: no  Equipment/Devices:  Discharge Condition: stable  CODE STATUS: DNR  Diet recommendation: Heart Healthy / Carb Modified  Brief/Interim Summary: HPI was taken from Dr. Alvester Morin: Matthew [name] is a 89 y.o. male with medical history significant of multiple medical issues including atrial fibrillation, HFrEF, coronary artery disease, end-stage renal disease on hemodialysis Monday Wednesday Friday, GERD, hypertension, hyperlipidemia, type 2 diabetes presenting with fall and right hip fracture.  History from patient as well as daughter.  Per the daughter, patient went to get up to use the bathroom.  Slipped and fell on his right hip with secondary hip fracture.  No reported head trauma loss conscious.  No reported weakness or dizziness prior to episode.  Baseline ESRD on hemodialysis Monday Wednesday Friday.  No missed dialysis sessions.  Follows Dr. Welton Flakes outpatient from a cardiology standpoint.  Has had some mild medication changes secondary to elevated liver function, though daughter is not fully aware of which medications.  No chest pain or belly pain.  No nausea or vomiting.  No hemiparesis or confusion.  No dizziness. Presented to the ER afebrile, hemodynamically stable.  Satting well on room air.  White count 4.6, hemoglobin 12.3, platelets 110, creatinine 3.93.  Imaging with comminuted intertrochanteric fracture of the right femoral neck with varus angulation.  Chest x-ray with mild vascular congestion.  Dr. Martha Clan with orthopedic surgery aware of case planning operative intervention.  Discharge Diagnoses:  Principal Problem:   Closed hip  fracture requiring operative repair, right, sequela Active Problems:   End-stage renal disease on hemodialysis (HCC)   Hyperkalemia   Chronic hypotension   Atrial fibrillation (HCC)   CAD (coronary artery disease)   HFrEF (heart failure with reduced ejection fraction) (HCC)   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)   HLD (hyperlipidemia)   Transaminitis   Drop in hemoglobin  Closed hip fracture: s/p intramedullary fixation as per ortho surg. PT/OT recs SNF and will d/c to SNF tomorrow. Continue on plavix, aspirin for DVT prophylaxis as per ortho surg. Weightbearing as tolerated on the right lower extremity and should continue physical therapy for right hip range of motion, lower extremity strengthening and gait training until follow-up as per ortho surg    ESRD: on HD MWF. Nephro following and recs apprec    Hyperkalemia: resolved    Chronic hypotension: continue on midodrine    PAF: continue on sotatol. Not on chronic anticoagulation, high fall risk    Chronic systolic CHF: echo in 2019 w/ EF 45%. Fluid management w/ HD    Hx of CAD: s/p 5 vessel CABG in 2001. Continue on plavix, aspirin    DM2: HbA1c 5.9, well controlled. D/c glipizide, diet controlled only is needed at this time. Will need repeat HbA1c in 3 months.    HLD: continue on statin     Transaminitis: resolved    ACD: likely secondary to ESRD. Will transfuse if Hb < 7.0   Discharge Instructions  Discharge Instructions     Diet - low sodium heart healthy   Complete by: As directed    Diet Carb Modified   Complete by: As directed  Discharge instructions   Complete by: As directed    F/u w/ PCP in 1-2 weeks. F/u w/ ortho surg, Dr. Martha Clan, in 10-14 days. Weightbearing as tolerated on the right lower extremity and should continue physical therapy for right hip range of motion, lower extremity strengthening and gait training until follow-up.   Increase activity slowly   Complete by: As  directed    No wound care   Complete by: As directed       Allergies as of 11/19/2022       Reactions   Other    Other reaction(s): Myalgias (intolerance), Other (See Comments) Statin Drugs  Statin Drugs    Amoxicillin Diarrhea        Medication List     STOP taking these medications    Belsomra 10 MG Tabs Generic drug: Suvorexant   gabapentin 300 MG capsule Commonly known as: NEURONTIN   glipiZIDE 5 MG tablet Commonly known as: GLUCOTROL   levocetirizine 5 MG tablet Commonly known as: XYZAL   metoprolol succinate 50 MG 24 hr tablet Commonly known as: TOPROL-XL   QUEtiapine 25 MG tablet Commonly known as: SEROQUEL       TAKE these medications    acyclovir ointment 5 % Commonly known as: ZOVIRAX Apply 1 application topically daily as needed (shingles).   aspirin EC 325 MG tablet Take 1 tablet (325 mg total) by mouth daily with breakfast.   bisacodyl 10 MG suppository Commonly known as: DULCOLAX Place 1 suppository (10 mg total) rectally daily as needed for moderate constipation.   cetirizine 10 MG tablet Commonly known as: ZYRTEC Take 10 mg by mouth daily.   cholecalciferol 25 MCG (1000 UNIT) tablet Commonly known as: VITAMIN D3 Take 1,000 Units by mouth daily with breakfast.   clopidogrel 75 MG tablet Commonly known as: PLAVIX TAKE 1 TABLET BY MOUTH ONCE  DAILY   ferrous sulfate 325 (65 FE) MG tablet Take 325 mg by mouth 2 (two) times daily with a meal.   Fish Oil 1000 MG Caps Take 4,000 mg by mouth daily with lunch.   fluticasone 50 MCG/ACT nasal spray Commonly known as: FLONASE Place 2 sprays into both nostrils daily as needed for allergies or rhinitis.   furosemide 40 MG tablet Commonly known as: LASIX Take 40 mg by mouth.   levothyroxine 100 MCG tablet Commonly known as: SYNTHROID TAKE 1 TABLET BY MOUTH IN THE  MORNING What changed: Another medication with the same name was removed. Continue taking this medication, and follow  the directions you see here.   lidocaine-prilocaine cream Commonly known as: EMLA Apply 1 Application topically as needed (before dialysis).   midodrine 5 MG tablet Commonly known as: PROAMATINE Take 1 tablet (5 mg total) by mouth 3 (three) times daily with meals. What changed:  when to take this reasons to take this   multivitamin Tabs tablet Take 1 tablet by mouth daily.   nitroGLYCERIN 0.4 MG SL tablet Commonly known as: NITROSTAT Place 0.4 mg under the tongue every 5 (five) minutes as needed for chest pain.   polyethylene glycol 17 g packet Commonly known as: MIRALAX / GLYCOLAX Take 17 g by mouth daily.   potassium chloride SA 20 MEQ tablet Commonly known as: KLOR-CON M Take 40 mEq by mouth daily.   rosuvastatin 40 MG tablet Commonly known as: CRESTOR TAKE 1 TABLET BY MOUTH ONCE  DAILY   senna-docusate 8.6-50 MG tablet Commonly known as: Senokot-S Take 2 tablets by mouth 2 (two) times daily.  sotalol 80 MG tablet Commonly known as: Betapace Take 1 tablet (80 mg total) by mouth 2 (two) times daily.   traMADol 50 MG tablet Commonly known as: ULTRAM Take 1 tablet (50 mg total) by mouth every 6 (six) hours as needed for up to 2 days for moderate pain or severe pain. What changed: reasons to take this        Follow-up Information     Juanell Fairly, MD Follow up.   Specialty: Orthopedic Surgery Why: F/u in 10-14 days Contact information: [address] [place] [address] [phone]                Allergies  Allergen Reactions   Other     Other reaction(s): Myalgias (intolerance), Other (See Comments) Statin Drugs  Statin Drugs     Amoxicillin Diarrhea    Consultations: Nephro Ortho surg    Procedures/Studies: DG FEMUR, MIN 2 VIEWS RIGHT  Result Date: 11/12/2022 CLINICAL DATA:  Status post intramedullary rod fixation of right femur fracture. EXAM: RIGHT FEMUR 2 VIEWS COMPARISON:  November 12, 2022. FINDINGS: Status post  intramedullary rod fixation of intertrochanteric fracture involving proximal right femur. Good alignment of fracture components is noted. Expected postsurgical changes are seen in the surrounding soft tissues. IMPRESSION: Status post intramedullary rod fixation of proximal right femoral intertrochanteric fracture. Electronically Signed   By: Lupita Raider M.D.   On: 11/12/2022 15:36   DG HIP UNILAT WITH PELVIS 2-3 VIEWS RIGHT  Result Date: 11/12/2022 CLINICAL DATA:  Surgery, elective EXAM: DG HIP (WITH OR WITHOUT PELVIS) 2-3V RIGHT COMPARISON:  None Available. FINDINGS: Fluoro time: 1 minute and 30 seconds. Four C-arm fluoroscopic images were obtained intraoperatively and submitted for post operative interpretation. These images demonstrate surgical changes associated with intramedullary nail and screw fixation of the femur. Please see the performing provider's procedural report for further detail. IMPRESSION: Intraoperative fluoroscopic imaging, as detailed above. Electronically Signed   By: Feliberto Harts M.D.   On: 11/12/2022 14:35   DG C-Arm 1-60 Min-No Report  Result Date: 11/12/2022 Fluoroscopy was utilized by the requesting physician.  No radiographic interpretation.   DG Chest Port 1 View  Result Date: 11/12/2022 CLINICAL DATA:  Right femoral neck fracture. EXAM: PORTABLE CHEST 1 VIEW COMPARISON:  02/11/2021 FINDINGS: The cardio pericardial silhouette is enlarged. Status post CABG. Mild vascular congestion without edema. No focal consolidation or pleural effusion. Vascular stent device noted medial left arm. Bones are diffusely demineralized. IMPRESSION: Enlargement of the cardiopericardial silhouette with mild vascular congestion. Electronically Signed   By: Kennith Center M.D.   On: 11/12/2022 04:59   DG Hip Unilat W or Wo Pelvis 2-3 Views Right  Result Date: 11/12/2022 CLINICAL DATA:  Fall.  Right hip pain. EXAM: DG HIP (WITH OR WITHOUT PELVIS) 2-3V RIGHT COMPARISON:  None Available.  FINDINGS: Diffuse osteopenia. SI joints and symphysis pubis unremarkable. Deformity of the right superior inferior pubic rami compatible with healed fractures. Acute comminuted intertrochanteric fracture of the right femoral neck evident with varus angulation. IMPRESSION: Acute comminuted intertrochanteric fracture of the right femoral neck with varus angulation. Electronically Signed   By: Kennith Center M.D.   On: 11/12/2022 04:58   DG Femur Min 2 Views Right  Result Date: 11/12/2022 CLINICAL DATA:  Fall. EXAM: RIGHT FEMUR 2 VIEWS COMPARISON:  None Available. FINDINGS: Two view show study shows diffuse osteopenia. Comminuted intertrochanteric fracture of the femoral neck evident with varus angulation. Deformity in the right superior inferior pubic rami suggest remote  trauma. Degenerative changes are evident at the knee. IMPRESSION: Comminuted intertrochanteric fracture of the right femoral neck with varus angulation. Electronically Signed   By: Kennith Center M.D.   On: 11/12/2022 04:56   VAS US DUPLEX DIALYSIS ACCESS (AVF,AVG)  Result Date: 10/27/2022 DIALYSIS ACCESS Patient Name:  DANZIG HELMKAMP  Date of Exam:   10/26/2022 Medical Rec #: [id number]        Accession #:    [id number] Date of Birth: [birth year]         Patient Gender: M Patient Age:   40 years Exam Location:  [facility] Procedure:      VAS US DUPLEX DIALYSIS ACCESS (AVF, AVG) Referring Phys: Sheppard Plumber --------------------------------------------------------------------------------  Reason for Exam: Routine follow up. Access Site: Left Upper Extremity. Access Type: Upper arm straight graft. History: 04/15/2021 Insertion of left arm AVGG brachial artery to axillary vein           04/01/2022: Left Brachial Artery to Axillary vein AVG cannulation under          [place]. Left Arm Shuntogram. Stent placement of the Venous Anastomosis and          the Axillary vein with a 8 mm diameter by 7.5 cm length Viabahn stent. Performing Technologist:  Hardie Lora RVT  Examination Guidelines: A complete evaluation includes B-mode imaging, spectral Doppler, color Doppler, and power Doppler as needed of all accessible portions of each vessel. Unilateral testing is considered an integral part of a complete examination. Limited examinations for reoccurring indications may be performed as noted.  Findings:   +--------------------+----------+-----------------+--------+ AVG                 PSV (cm/s)Flow Vol (mL/min)Describe +--------------------+----------+-----------------+--------+ Native artery inflow   191          1604                +--------------------+----------+-----------------+--------+ Arterial anastomosis   470                              +--------------------+----------+-----------------+--------+ Prox graft             302                              +--------------------+----------+-----------------+--------+ Mid graft              315                              +--------------------+----------+-----------------+--------+ Distal graft           243                              +--------------------+----------+-----------------+--------+ Venous anastomosis     270                      stent   +--------------------+----------+-----------------+--------+ Venous outflow         135                      stent   +--------------------+----------+-----------------+--------+  Summary: Patent arteriovenous graft.  No obvious restenosis of AVGG,  *See table(s) above for measurements and observations.  Diagnosing physician: Festus Barren MD Electronically signed by Festus Barren MD on 10/27/2022 at 8:47:20  AM.   --------------------------------------------------------------------------------   Final    (Echo, Carotid, EGD, Colonoscopy, ERCP)    Subjective: Pt c/o fatigue    Discharge Exam: Vitals:   11/19/22 1100 11/19/22 1130  BP: (!) 104/50 (!) 104/56  Pulse: 62 64  Resp: 11 11  Temp:    SpO2: 100% 100%    Vitals:   11/19/22 1004 11/19/22 1030 11/19/22 1100 11/19/22 1130  BP: (!) 105/46 (!) 105/49 (!) 104/50 (!) 104/56  Pulse: (!) 59 (!) 58 62 64  Resp: 15 13 11 11   Temp:      TempSrc:      SpO2: 100% 99% 100% 100%  Weight:      Height:        General: Pt is alert, awake, not in acute distress Cardiovascular:  S1/S2 +, no rubs, no gallops Respiratory: CTA bilaterally, no wheezing, no rhonchi Abdominal: Soft, NT, obese, bowel sounds + Extremities: no cyanosis    The results of significant diagnostics from this hospitalization (including imaging, microbiology, ancillary and laboratory) are listed below for reference.     Microbiology: No results found for this or any previous visit (from the past 240 hour(s)).   Labs: BNP (last 3 results) No results for input(s): "BNP" in the last 8760 hours. Basic Metabolic Panel: Recent Labs  Lab 11/13/22 0522 11/15/22 0832 11/16/22 0450 11/16/22 1300 11/17/22 0436 11/18/22 0543 11/19/22 0610  NA 135 132*  --   --  133* 135 135  K 4.9 5.9* 5.7* 6.5* 5.7* 4.8 4.1  CL 107 95*  --   --  95* 96* 96*  CO2 18* 26  --   --  30 30 28   GLUCOSE 155* 127*  --   --  139* 120* 81  BUN 85* 82*  --   --  55* 39* 58*  CREATININE 3.72* 3.72*  --   --  3.32* 2.38* 2.98*  CALCIUM 7.2* 8.4*  --   --  8.6* 8.2* 8.6*  PHOS  --  5.8*  --   --   --   --   --    Liver Function Tests: Recent Labs  Lab 11/13/22 0522 11/15/22 0832  AST 35  --   ALT 30  --   ALKPHOS 103  --   BILITOT 0.6  --   PROT 5.2*  --   ALBUMIN 2.4* 2.9*   No results for input(s): "LIPASE", "AMYLASE" in the last 168 hours. No results for input(s): "AMMONIA" in the last 168 hours. CBC: Recent Labs  Lab 11/13/22 0656 11/14/22 0856 11/16/22 0450 11/17/22 0436 11/18/22 0543 11/19/22 0610  WBC 9.7 8.1  --  6.4 5.7 6.4  HGB 10.9* 9.8* 9.0* 8.6* 8.7* 8.9*  HCT 33.1* 29.6*  --  26.6* 27.4* 27.6*  MCV 98.5 98.7  --  100.0 103.0* 102.2*  PLT 103* 100*  --  127* 132* 147*    Cardiac Enzymes: No results for input(s): "CKTOTAL", "CKMB", "CKMBINDEX", "TROPONINI" in the last 168 hours. BNP: Invalid input(s): "POCBNP" CBG: Recent Labs  Lab 11/19/22 0824 11/19/22 0830 11/19/22 0845 11/19/22 0906 11/19/22 1027  GLUCAP 58* 60* 64* 78 110*   D-Dimer No results for input(s): "DDIMER" in the last 72 hours. Hgb A1c No results for input(s): "HGBA1C" in the last 72 hours. Lipid Profile No results for input(s): "CHOL", "HDL", "LDLCALC", "TRIG", "CHOLHDL", "LDLDIRECT" in the last 72 hours. Thyroid function studies No results for input(s): "TSH", "T4TOTAL", "T3FREE", "THYROIDAB" in the last 72 hours.  Invalid  input(s): "FREET3" Anemia work up No results for input(s): "VITAMINB12", "FOLATE", "FERRITIN", "TIBC", "IRON", "RETICCTPCT" in the last 72 hours. Urinalysis    Component Value Date/Time   COLORURINE YELLOW (A) 05/24/2019 0021   APPEARANCEUR CLEAR (A) 05/24/2019 0021   LABSPEC 1.012 05/24/2019 0021   PHURINE 6.0 05/24/2019 0021   GLUCOSEU NEGATIVE 05/24/2019 0021   HGBUR NEGATIVE 05/24/2019 0021   BILIRUBINUR Negative 10/15/2022 1202   KETONESUR NEGATIVE 05/24/2019 0021   PROTEINUR Positive (A) 10/15/2022 1202   PROTEINUR 30 (A) 05/24/2019 0021   UROBILINOGEN 0.2 10/15/2022 1202   NITRITE Negative 10/15/2022 1202   NITRITE NEGATIVE 05/24/2019 0021   LEUKOCYTESUR Large (3+) (A) 10/15/2022 1202   LEUKOCYTESUR NEGATIVE 05/24/2019 0021   Sepsis Labs Recent Labs  Lab 11/14/22 0856 11/17/22 0436 11/18/22 0543 11/19/22 0610  WBC 8.1 6.4 5.7 6.4   Microbiology No results found for this or any previous visit (from the past 240 hour(s)).   Time coordinating discharge: Over 30 minutes  SIGNED:   Charise Killian, MD  [facility] 11/19/2022, 11:54 AM Pager   If 7PM-7AM, please contact night-coverage [email or web address]

## 2022-11-21 DIAGNOSIS — Z992 Dependence on renal dialysis: Secondary | ICD-10-CM | POA: Diagnosis not present

## 2022-11-21 DIAGNOSIS — N186 End stage renal disease: Secondary | ICD-10-CM | POA: Diagnosis not present

## 2022-11-22 DIAGNOSIS — N250 Renal osteodystrophy: Secondary | ICD-10-CM | POA: Diagnosis not present

## 2022-11-22 DIAGNOSIS — D509 Iron deficiency anemia, unspecified: Secondary | ICD-10-CM | POA: Diagnosis not present

## 2022-11-22 DIAGNOSIS — N186 End stage renal disease: Secondary | ICD-10-CM | POA: Diagnosis not present

## 2022-11-22 DIAGNOSIS — D631 Anemia in chronic kidney disease: Secondary | ICD-10-CM | POA: Diagnosis not present

## 2022-11-22 DIAGNOSIS — Z992 Dependence on renal dialysis: Secondary | ICD-10-CM | POA: Diagnosis not present

## 2022-11-24 DIAGNOSIS — S72141D Displaced intertrochanteric fracture of right femur, subsequent encounter for closed fracture with routine healing: Secondary | ICD-10-CM | POA: Diagnosis not present

## 2022-11-24 DIAGNOSIS — N186 End stage renal disease: Secondary | ICD-10-CM | POA: Diagnosis not present

## 2022-11-24 DIAGNOSIS — E1121 Type 2 diabetes mellitus with diabetic nephropathy: Secondary | ICD-10-CM | POA: Diagnosis not present

## 2022-11-24 DIAGNOSIS — I130 Hypertensive heart and chronic kidney disease with heart failure and stage 1 through stage 4 chronic kidney disease, or unspecified chronic kidney disease: Secondary | ICD-10-CM | POA: Diagnosis not present

## 2022-11-24 DIAGNOSIS — D638 Anemia in other chronic diseases classified elsewhere: Secondary | ICD-10-CM | POA: Diagnosis not present

## 2022-11-24 DIAGNOSIS — I5022 Chronic systolic (congestive) heart failure: Secondary | ICD-10-CM | POA: Diagnosis not present

## 2022-11-24 DIAGNOSIS — I951 Orthostatic hypotension: Secondary | ICD-10-CM | POA: Diagnosis not present

## 2022-11-24 DIAGNOSIS — I480 Paroxysmal atrial fibrillation: Secondary | ICD-10-CM | POA: Diagnosis not present

## 2022-11-24 DIAGNOSIS — N250 Renal osteodystrophy: Secondary | ICD-10-CM | POA: Diagnosis not present

## 2022-11-24 DIAGNOSIS — B029 Zoster without complications: Secondary | ICD-10-CM | POA: Diagnosis not present

## 2022-11-24 DIAGNOSIS — I2510 Atherosclerotic heart disease of native coronary artery without angina pectoris: Secondary | ICD-10-CM | POA: Diagnosis not present

## 2022-11-24 DIAGNOSIS — D631 Anemia in chronic kidney disease: Secondary | ICD-10-CM | POA: Diagnosis not present

## 2022-11-24 DIAGNOSIS — Z992 Dependence on renal dialysis: Secondary | ICD-10-CM | POA: Diagnosis not present

## 2022-11-24 DIAGNOSIS — D509 Iron deficiency anemia, unspecified: Secondary | ICD-10-CM | POA: Diagnosis not present

## 2022-11-24 DIAGNOSIS — W19XXXD Unspecified fall, subsequent encounter: Secondary | ICD-10-CM | POA: Diagnosis not present

## 2022-11-26 DIAGNOSIS — Z992 Dependence on renal dialysis: Secondary | ICD-10-CM | POA: Diagnosis not present

## 2022-11-26 DIAGNOSIS — N186 End stage renal disease: Secondary | ICD-10-CM | POA: Diagnosis not present

## 2022-11-26 DIAGNOSIS — D631 Anemia in chronic kidney disease: Secondary | ICD-10-CM | POA: Diagnosis not present

## 2022-11-26 DIAGNOSIS — D509 Iron deficiency anemia, unspecified: Secondary | ICD-10-CM | POA: Diagnosis not present

## 2022-11-26 DIAGNOSIS — N250 Renal osteodystrophy: Secondary | ICD-10-CM | POA: Diagnosis not present

## 2022-11-27 DIAGNOSIS — E8779 Other fluid overload: Secondary | ICD-10-CM | POA: Diagnosis not present

## 2022-11-27 DIAGNOSIS — Z992 Dependence on renal dialysis: Secondary | ICD-10-CM | POA: Diagnosis not present

## 2022-11-27 DIAGNOSIS — N186 End stage renal disease: Secondary | ICD-10-CM | POA: Diagnosis not present

## 2022-11-29 DIAGNOSIS — N250 Renal osteodystrophy: Secondary | ICD-10-CM | POA: Diagnosis not present

## 2022-11-29 DIAGNOSIS — D631 Anemia in chronic kidney disease: Secondary | ICD-10-CM | POA: Diagnosis not present

## 2022-11-29 DIAGNOSIS — N186 End stage renal disease: Secondary | ICD-10-CM | POA: Diagnosis not present

## 2022-11-29 DIAGNOSIS — D509 Iron deficiency anemia, unspecified: Secondary | ICD-10-CM | POA: Diagnosis not present

## 2022-11-29 DIAGNOSIS — Z992 Dependence on renal dialysis: Secondary | ICD-10-CM | POA: Diagnosis not present

## 2022-11-30 ENCOUNTER — Other Ambulatory Visit: Payer: Self-pay

## 2022-11-30 DIAGNOSIS — S32511A Fracture of superior rim of right pubis, initial encounter for closed fracture: Secondary | ICD-10-CM | POA: Diagnosis not present

## 2022-12-01 DIAGNOSIS — I480 Paroxysmal atrial fibrillation: Secondary | ICD-10-CM | POA: Diagnosis not present

## 2022-12-01 DIAGNOSIS — E1121 Type 2 diabetes mellitus with diabetic nephropathy: Secondary | ICD-10-CM | POA: Diagnosis not present

## 2022-12-01 DIAGNOSIS — I130 Hypertensive heart and chronic kidney disease with heart failure and stage 1 through stage 4 chronic kidney disease, or unspecified chronic kidney disease: Secondary | ICD-10-CM | POA: Diagnosis not present

## 2022-12-01 DIAGNOSIS — N186 End stage renal disease: Secondary | ICD-10-CM | POA: Diagnosis not present

## 2022-12-01 DIAGNOSIS — N250 Renal osteodystrophy: Secondary | ICD-10-CM | POA: Diagnosis not present

## 2022-12-01 DIAGNOSIS — D509 Iron deficiency anemia, unspecified: Secondary | ICD-10-CM | POA: Diagnosis not present

## 2022-12-01 DIAGNOSIS — B029 Zoster without complications: Secondary | ICD-10-CM | POA: Diagnosis not present

## 2022-12-01 DIAGNOSIS — S72141D Displaced intertrochanteric fracture of right femur, subsequent encounter for closed fracture with routine healing: Secondary | ICD-10-CM | POA: Diagnosis not present

## 2022-12-01 DIAGNOSIS — Z992 Dependence on renal dialysis: Secondary | ICD-10-CM | POA: Diagnosis not present

## 2022-12-01 DIAGNOSIS — D631 Anemia in chronic kidney disease: Secondary | ICD-10-CM | POA: Diagnosis not present

## 2022-12-01 DIAGNOSIS — I5022 Chronic systolic (congestive) heart failure: Secondary | ICD-10-CM | POA: Diagnosis not present

## 2022-12-02 ENCOUNTER — Other Ambulatory Visit: Payer: Self-pay

## 2022-12-02 MED ORDER — CLOPIDOGREL BISULFATE 75 MG PO TABS: 75.0000 mg | ORAL_TABLET | Freq: Every day | ORAL | 1 refills | Status: DC

## 2022-12-03 DIAGNOSIS — I130 Hypertensive heart and chronic kidney disease with heart failure and stage 1 through stage 4 chronic kidney disease, or unspecified chronic kidney disease: Secondary | ICD-10-CM | POA: Diagnosis not present

## 2022-12-03 DIAGNOSIS — I951 Orthostatic hypotension: Secondary | ICD-10-CM | POA: Diagnosis not present

## 2022-12-03 DIAGNOSIS — N250 Renal osteodystrophy: Secondary | ICD-10-CM | POA: Diagnosis not present

## 2022-12-03 DIAGNOSIS — I480 Paroxysmal atrial fibrillation: Secondary | ICD-10-CM | POA: Diagnosis not present

## 2022-12-03 DIAGNOSIS — K5900 Constipation, unspecified: Secondary | ICD-10-CM | POA: Diagnosis not present

## 2022-12-03 DIAGNOSIS — D509 Iron deficiency anemia, unspecified: Secondary | ICD-10-CM | POA: Diagnosis not present

## 2022-12-03 DIAGNOSIS — N186 End stage renal disease: Secondary | ICD-10-CM | POA: Diagnosis not present

## 2022-12-03 DIAGNOSIS — I5022 Chronic systolic (congestive) heart failure: Secondary | ICD-10-CM | POA: Diagnosis not present

## 2022-12-03 DIAGNOSIS — D638 Anemia in other chronic diseases classified elsewhere: Secondary | ICD-10-CM | POA: Diagnosis not present

## 2022-12-03 DIAGNOSIS — Z992 Dependence on renal dialysis: Secondary | ICD-10-CM | POA: Diagnosis not present

## 2022-12-03 DIAGNOSIS — D631 Anemia in chronic kidney disease: Secondary | ICD-10-CM | POA: Diagnosis not present

## 2022-12-03 DIAGNOSIS — S72141D Displaced intertrochanteric fracture of right femur, subsequent encounter for closed fracture with routine healing: Secondary | ICD-10-CM | POA: Diagnosis not present

## 2022-12-06 DIAGNOSIS — S72141D Displaced intertrochanteric fracture of right femur, subsequent encounter for closed fracture with routine healing: Secondary | ICD-10-CM | POA: Diagnosis not present

## 2022-12-06 DIAGNOSIS — D509 Iron deficiency anemia, unspecified: Secondary | ICD-10-CM | POA: Diagnosis not present

## 2022-12-06 DIAGNOSIS — E785 Hyperlipidemia, unspecified: Secondary | ICD-10-CM | POA: Diagnosis not present

## 2022-12-06 DIAGNOSIS — D631 Anemia in chronic kidney disease: Secondary | ICD-10-CM | POA: Diagnosis not present

## 2022-12-06 DIAGNOSIS — I5022 Chronic systolic (congestive) heart failure: Secondary | ICD-10-CM | POA: Diagnosis not present

## 2022-12-06 DIAGNOSIS — E875 Hyperkalemia: Secondary | ICD-10-CM | POA: Diagnosis not present

## 2022-12-06 DIAGNOSIS — N250 Renal osteodystrophy: Secondary | ICD-10-CM | POA: Diagnosis not present

## 2022-12-06 DIAGNOSIS — I951 Orthostatic hypotension: Secondary | ICD-10-CM | POA: Diagnosis not present

## 2022-12-06 DIAGNOSIS — N186 End stage renal disease: Secondary | ICD-10-CM | POA: Diagnosis not present

## 2022-12-06 DIAGNOSIS — E1121 Type 2 diabetes mellitus with diabetic nephropathy: Secondary | ICD-10-CM | POA: Diagnosis not present

## 2022-12-06 DIAGNOSIS — I480 Paroxysmal atrial fibrillation: Secondary | ICD-10-CM | POA: Diagnosis not present

## 2022-12-06 DIAGNOSIS — Z992 Dependence on renal dialysis: Secondary | ICD-10-CM | POA: Diagnosis not present

## 2022-12-07 DIAGNOSIS — E1121 Type 2 diabetes mellitus with diabetic nephropathy: Secondary | ICD-10-CM | POA: Diagnosis not present

## 2022-12-07 DIAGNOSIS — K5900 Constipation, unspecified: Secondary | ICD-10-CM | POA: Diagnosis not present

## 2022-12-07 DIAGNOSIS — I5022 Chronic systolic (congestive) heart failure: Secondary | ICD-10-CM | POA: Diagnosis not present

## 2022-12-07 DIAGNOSIS — I951 Orthostatic hypotension: Secondary | ICD-10-CM | POA: Diagnosis not present

## 2022-12-07 DIAGNOSIS — S72141D Displaced intertrochanteric fracture of right femur, subsequent encounter for closed fracture with routine healing: Secondary | ICD-10-CM | POA: Diagnosis not present

## 2022-12-07 DIAGNOSIS — E785 Hyperlipidemia, unspecified: Secondary | ICD-10-CM | POA: Diagnosis not present

## 2022-12-07 DIAGNOSIS — I480 Paroxysmal atrial fibrillation: Secondary | ICD-10-CM | POA: Diagnosis not present

## 2022-12-07 DIAGNOSIS — N186 End stage renal disease: Secondary | ICD-10-CM | POA: Diagnosis not present

## 2022-12-07 DIAGNOSIS — D638 Anemia in other chronic diseases classified elsewhere: Secondary | ICD-10-CM | POA: Diagnosis not present

## 2022-12-07 DIAGNOSIS — I130 Hypertensive heart and chronic kidney disease with heart failure and stage 1 through stage 4 chronic kidney disease, or unspecified chronic kidney disease: Secondary | ICD-10-CM | POA: Diagnosis not present

## 2022-12-07 DIAGNOSIS — B029 Zoster without complications: Secondary | ICD-10-CM | POA: Diagnosis not present

## 2022-12-07 DIAGNOSIS — Z992 Dependence on renal dialysis: Secondary | ICD-10-CM | POA: Diagnosis not present

## 2022-12-08 DIAGNOSIS — D509 Iron deficiency anemia, unspecified: Secondary | ICD-10-CM | POA: Diagnosis not present

## 2022-12-08 DIAGNOSIS — R52 Pain, unspecified: Secondary | ICD-10-CM | POA: Diagnosis not present

## 2022-12-08 DIAGNOSIS — I5022 Chronic systolic (congestive) heart failure: Secondary | ICD-10-CM | POA: Diagnosis not present

## 2022-12-08 DIAGNOSIS — D638 Anemia in other chronic diseases classified elsewhere: Secondary | ICD-10-CM | POA: Diagnosis not present

## 2022-12-08 DIAGNOSIS — R600 Localized edema: Secondary | ICD-10-CM | POA: Diagnosis not present

## 2022-12-08 DIAGNOSIS — N186 End stage renal disease: Secondary | ICD-10-CM | POA: Diagnosis not present

## 2022-12-08 DIAGNOSIS — K5900 Constipation, unspecified: Secondary | ICD-10-CM | POA: Diagnosis not present

## 2022-12-08 DIAGNOSIS — N250 Renal osteodystrophy: Secondary | ICD-10-CM | POA: Diagnosis not present

## 2022-12-08 DIAGNOSIS — Z992 Dependence on renal dialysis: Secondary | ICD-10-CM | POA: Diagnosis not present

## 2022-12-08 DIAGNOSIS — D631 Anemia in chronic kidney disease: Secondary | ICD-10-CM | POA: Diagnosis not present

## 2022-12-08 DIAGNOSIS — S72141D Displaced intertrochanteric fracture of right femur, subsequent encounter for closed fracture with routine healing: Secondary | ICD-10-CM | POA: Diagnosis not present

## 2022-12-10 DIAGNOSIS — Z992 Dependence on renal dialysis: Secondary | ICD-10-CM | POA: Diagnosis not present

## 2022-12-10 DIAGNOSIS — D509 Iron deficiency anemia, unspecified: Secondary | ICD-10-CM | POA: Diagnosis not present

## 2022-12-10 DIAGNOSIS — S72141D Displaced intertrochanteric fracture of right femur, subsequent encounter for closed fracture with routine healing: Secondary | ICD-10-CM | POA: Diagnosis not present

## 2022-12-10 DIAGNOSIS — I480 Paroxysmal atrial fibrillation: Secondary | ICD-10-CM | POA: Diagnosis not present

## 2022-12-10 DIAGNOSIS — E785 Hyperlipidemia, unspecified: Secondary | ICD-10-CM | POA: Diagnosis not present

## 2022-12-10 DIAGNOSIS — N250 Renal osteodystrophy: Secondary | ICD-10-CM | POA: Diagnosis not present

## 2022-12-10 DIAGNOSIS — R600 Localized edema: Secondary | ICD-10-CM | POA: Diagnosis not present

## 2022-12-10 DIAGNOSIS — D631 Anemia in chronic kidney disease: Secondary | ICD-10-CM | POA: Diagnosis not present

## 2022-12-10 DIAGNOSIS — N186 End stage renal disease: Secondary | ICD-10-CM | POA: Diagnosis not present

## 2022-12-13 DIAGNOSIS — Z992 Dependence on renal dialysis: Secondary | ICD-10-CM | POA: Diagnosis not present

## 2022-12-13 DIAGNOSIS — N186 End stage renal disease: Secondary | ICD-10-CM | POA: Diagnosis not present

## 2022-12-13 DIAGNOSIS — D631 Anemia in chronic kidney disease: Secondary | ICD-10-CM | POA: Diagnosis not present

## 2022-12-13 DIAGNOSIS — N250 Renal osteodystrophy: Secondary | ICD-10-CM | POA: Diagnosis not present

## 2022-12-13 DIAGNOSIS — D509 Iron deficiency anemia, unspecified: Secondary | ICD-10-CM | POA: Diagnosis not present

## 2022-12-15 DIAGNOSIS — N250 Renal osteodystrophy: Secondary | ICD-10-CM | POA: Diagnosis not present

## 2022-12-15 DIAGNOSIS — D509 Iron deficiency anemia, unspecified: Secondary | ICD-10-CM | POA: Diagnosis not present

## 2022-12-15 DIAGNOSIS — N186 End stage renal disease: Secondary | ICD-10-CM | POA: Diagnosis not present

## 2022-12-15 DIAGNOSIS — Z992 Dependence on renal dialysis: Secondary | ICD-10-CM | POA: Diagnosis not present

## 2022-12-15 DIAGNOSIS — D631 Anemia in chronic kidney disease: Secondary | ICD-10-CM | POA: Diagnosis not present

## 2022-12-17 DIAGNOSIS — Z992 Dependence on renal dialysis: Secondary | ICD-10-CM | POA: Diagnosis not present

## 2022-12-17 DIAGNOSIS — I480 Paroxysmal atrial fibrillation: Secondary | ICD-10-CM | POA: Diagnosis not present

## 2022-12-17 DIAGNOSIS — D509 Iron deficiency anemia, unspecified: Secondary | ICD-10-CM | POA: Diagnosis not present

## 2022-12-17 DIAGNOSIS — N186 End stage renal disease: Secondary | ICD-10-CM | POA: Diagnosis not present

## 2022-12-17 DIAGNOSIS — D631 Anemia in chronic kidney disease: Secondary | ICD-10-CM | POA: Diagnosis not present

## 2022-12-17 DIAGNOSIS — E1121 Type 2 diabetes mellitus with diabetic nephropathy: Secondary | ICD-10-CM | POA: Diagnosis not present

## 2022-12-17 DIAGNOSIS — R600 Localized edema: Secondary | ICD-10-CM | POA: Diagnosis not present

## 2022-12-17 DIAGNOSIS — I2510 Atherosclerotic heart disease of native coronary artery without angina pectoris: Secondary | ICD-10-CM | POA: Diagnosis not present

## 2022-12-17 DIAGNOSIS — N250 Renal osteodystrophy: Secondary | ICD-10-CM | POA: Diagnosis not present

## 2022-12-17 DIAGNOSIS — S72141D Displaced intertrochanteric fracture of right femur, subsequent encounter for closed fracture with routine healing: Secondary | ICD-10-CM | POA: Diagnosis not present

## 2022-12-17 DIAGNOSIS — I5022 Chronic systolic (congestive) heart failure: Secondary | ICD-10-CM | POA: Diagnosis not present

## 2022-12-20 DIAGNOSIS — Z992 Dependence on renal dialysis: Secondary | ICD-10-CM | POA: Diagnosis not present

## 2022-12-20 DIAGNOSIS — N186 End stage renal disease: Secondary | ICD-10-CM | POA: Diagnosis not present

## 2022-12-21 DIAGNOSIS — R601 Generalized edema: Secondary | ICD-10-CM | POA: Diagnosis not present

## 2022-12-22 DIAGNOSIS — Z992 Dependence on renal dialysis: Secondary | ICD-10-CM | POA: Diagnosis not present

## 2022-12-22 DIAGNOSIS — N186 End stage renal disease: Secondary | ICD-10-CM | POA: Diagnosis not present

## 2022-12-24 DIAGNOSIS — I2510 Atherosclerotic heart disease of native coronary artery without angina pectoris: Secondary | ICD-10-CM | POA: Diagnosis not present

## 2022-12-24 DIAGNOSIS — I5022 Chronic systolic (congestive) heart failure: Secondary | ICD-10-CM | POA: Diagnosis not present

## 2022-12-24 DIAGNOSIS — S72141D Displaced intertrochanteric fracture of right femur, subsequent encounter for closed fracture with routine healing: Secondary | ICD-10-CM | POA: Diagnosis not present

## 2022-12-24 DIAGNOSIS — I130 Hypertensive heart and chronic kidney disease with heart failure and stage 1 through stage 4 chronic kidney disease, or unspecified chronic kidney disease: Secondary | ICD-10-CM | POA: Diagnosis not present

## 2022-12-24 DIAGNOSIS — E1121 Type 2 diabetes mellitus with diabetic nephropathy: Secondary | ICD-10-CM | POA: Diagnosis not present

## 2022-12-24 DIAGNOSIS — E785 Hyperlipidemia, unspecified: Secondary | ICD-10-CM | POA: Diagnosis not present

## 2022-12-24 DIAGNOSIS — K5900 Constipation, unspecified: Secondary | ICD-10-CM | POA: Diagnosis not present

## 2022-12-24 DIAGNOSIS — N186 End stage renal disease: Secondary | ICD-10-CM | POA: Diagnosis not present

## 2022-12-24 DIAGNOSIS — I480 Paroxysmal atrial fibrillation: Secondary | ICD-10-CM | POA: Diagnosis not present

## 2022-12-24 DIAGNOSIS — R600 Localized edema: Secondary | ICD-10-CM | POA: Diagnosis not present

## 2022-12-24 DIAGNOSIS — Z992 Dependence on renal dialysis: Secondary | ICD-10-CM | POA: Diagnosis not present

## 2022-12-27 DIAGNOSIS — Z992 Dependence on renal dialysis: Secondary | ICD-10-CM | POA: Diagnosis not present

## 2022-12-27 DIAGNOSIS — N186 End stage renal disease: Secondary | ICD-10-CM | POA: Diagnosis not present

## 2022-12-27 DIAGNOSIS — D631 Anemia in chronic kidney disease: Secondary | ICD-10-CM | POA: Diagnosis not present

## 2022-12-27 DIAGNOSIS — D509 Iron deficiency anemia, unspecified: Secondary | ICD-10-CM | POA: Diagnosis not present

## 2022-12-27 DIAGNOSIS — N250 Renal osteodystrophy: Secondary | ICD-10-CM | POA: Diagnosis not present

## 2022-12-28 ENCOUNTER — Telehealth: Payer: Self-pay | Admitting: Internal Medicine

## 2022-12-28 DIAGNOSIS — I5022 Chronic systolic (congestive) heart failure: Secondary | ICD-10-CM | POA: Diagnosis not present

## 2022-12-28 DIAGNOSIS — Z7984 Long term (current) use of oral hypoglycemic drugs: Secondary | ICD-10-CM | POA: Diagnosis not present

## 2022-12-28 DIAGNOSIS — K219 Gastro-esophageal reflux disease without esophagitis: Secondary | ICD-10-CM | POA: Diagnosis not present

## 2022-12-28 DIAGNOSIS — Z7902 Long term (current) use of antithrombotics/antiplatelets: Secondary | ICD-10-CM | POA: Diagnosis not present

## 2022-12-28 DIAGNOSIS — Z87891 Personal history of nicotine dependence: Secondary | ICD-10-CM | POA: Diagnosis not present

## 2022-12-28 DIAGNOSIS — Z981 Arthrodesis status: Secondary | ICD-10-CM | POA: Diagnosis not present

## 2022-12-28 DIAGNOSIS — Z9841 Cataract extraction status, right eye: Secondary | ICD-10-CM | POA: Diagnosis not present

## 2022-12-28 DIAGNOSIS — I429 Cardiomyopathy, unspecified: Secondary | ICD-10-CM | POA: Diagnosis not present

## 2022-12-28 DIAGNOSIS — E1122 Type 2 diabetes mellitus with diabetic chronic kidney disease: Secondary | ICD-10-CM | POA: Diagnosis not present

## 2022-12-28 DIAGNOSIS — Z9842 Cataract extraction status, left eye: Secondary | ICD-10-CM | POA: Diagnosis not present

## 2022-12-28 DIAGNOSIS — K5909 Other constipation: Secondary | ICD-10-CM | POA: Diagnosis not present

## 2022-12-28 DIAGNOSIS — E785 Hyperlipidemia, unspecified: Secondary | ICD-10-CM | POA: Diagnosis not present

## 2022-12-28 DIAGNOSIS — Z9049 Acquired absence of other specified parts of digestive tract: Secondary | ICD-10-CM | POA: Diagnosis not present

## 2022-12-28 DIAGNOSIS — I2510 Atherosclerotic heart disease of native coronary artery without angina pectoris: Secondary | ICD-10-CM | POA: Diagnosis not present

## 2022-12-28 DIAGNOSIS — Z9181 History of falling: Secondary | ICD-10-CM | POA: Diagnosis not present

## 2022-12-28 DIAGNOSIS — D631 Anemia in chronic kidney disease: Secondary | ICD-10-CM | POA: Diagnosis not present

## 2022-12-28 DIAGNOSIS — Z992 Dependence on renal dialysis: Secondary | ICD-10-CM | POA: Diagnosis not present

## 2022-12-28 DIAGNOSIS — M1990 Unspecified osteoarthritis, unspecified site: Secondary | ICD-10-CM | POA: Diagnosis not present

## 2022-12-28 DIAGNOSIS — I132 Hypertensive heart and chronic kidney disease with heart failure and with stage 5 chronic kidney disease, or end stage renal disease: Secondary | ICD-10-CM | POA: Diagnosis not present

## 2022-12-28 DIAGNOSIS — E039 Hypothyroidism, unspecified: Secondary | ICD-10-CM | POA: Diagnosis not present

## 2022-12-28 DIAGNOSIS — S72141D Displaced intertrochanteric fracture of right femur, subsequent encounter for closed fracture with routine healing: Secondary | ICD-10-CM | POA: Diagnosis not present

## 2022-12-28 DIAGNOSIS — I480 Paroxysmal atrial fibrillation: Secondary | ICD-10-CM | POA: Diagnosis not present

## 2022-12-28 DIAGNOSIS — N186 End stage renal disease: Secondary | ICD-10-CM | POA: Diagnosis not present

## 2022-12-28 NOTE — Telephone Encounter (Signed)
Patient left VM wanting to know if he is supposed to be taking metoprolol? I don't see on his med list. Please advise.

## 2022-12-28 NOTE — Telephone Encounter (Signed)
[name], PT with [facility] left VM requesting verbal orders for PT 2 x 4 weeks, then 1 x 4 weeks for balance, strength, transfers, and gait.   Callback # [phone]

## 2022-12-29 ENCOUNTER — Other Ambulatory Visit: Payer: Self-pay | Admitting: Internal Medicine

## 2022-12-29 DIAGNOSIS — Z992 Dependence on renal dialysis: Secondary | ICD-10-CM | POA: Diagnosis not present

## 2022-12-29 DIAGNOSIS — N186 End stage renal disease: Secondary | ICD-10-CM | POA: Diagnosis not present

## 2022-12-29 DIAGNOSIS — D631 Anemia in chronic kidney disease: Secondary | ICD-10-CM | POA: Diagnosis not present

## 2022-12-29 DIAGNOSIS — N250 Renal osteodystrophy: Secondary | ICD-10-CM | POA: Diagnosis not present

## 2022-12-29 DIAGNOSIS — D509 Iron deficiency anemia, unspecified: Secondary | ICD-10-CM | POA: Diagnosis not present

## 2022-12-30 DIAGNOSIS — S72141D Displaced intertrochanteric fracture of right femur, subsequent encounter for closed fracture with routine healing: Secondary | ICD-10-CM | POA: Diagnosis not present

## 2022-12-30 DIAGNOSIS — E1122 Type 2 diabetes mellitus with diabetic chronic kidney disease: Secondary | ICD-10-CM | POA: Diagnosis not present

## 2022-12-30 DIAGNOSIS — I5022 Chronic systolic (congestive) heart failure: Secondary | ICD-10-CM | POA: Diagnosis not present

## 2022-12-30 DIAGNOSIS — N186 End stage renal disease: Secondary | ICD-10-CM | POA: Diagnosis not present

## 2022-12-30 DIAGNOSIS — I132 Hypertensive heart and chronic kidney disease with heart failure and with stage 5 chronic kidney disease, or end stage renal disease: Secondary | ICD-10-CM | POA: Diagnosis not present

## 2022-12-30 DIAGNOSIS — D631 Anemia in chronic kidney disease: Secondary | ICD-10-CM | POA: Diagnosis not present

## 2023-01-01 DIAGNOSIS — D631 Anemia in chronic kidney disease: Secondary | ICD-10-CM | POA: Diagnosis not present

## 2023-01-01 DIAGNOSIS — D509 Iron deficiency anemia, unspecified: Secondary | ICD-10-CM | POA: Diagnosis not present

## 2023-01-01 DIAGNOSIS — N250 Renal osteodystrophy: Secondary | ICD-10-CM | POA: Diagnosis not present

## 2023-01-01 DIAGNOSIS — N186 End stage renal disease: Secondary | ICD-10-CM | POA: Diagnosis not present

## 2023-01-01 DIAGNOSIS — Z992 Dependence on renal dialysis: Secondary | ICD-10-CM | POA: Diagnosis not present

## 2023-01-03 DIAGNOSIS — D509 Iron deficiency anemia, unspecified: Secondary | ICD-10-CM | POA: Diagnosis not present

## 2023-01-03 DIAGNOSIS — N250 Renal osteodystrophy: Secondary | ICD-10-CM | POA: Diagnosis not present

## 2023-01-03 DIAGNOSIS — D631 Anemia in chronic kidney disease: Secondary | ICD-10-CM | POA: Diagnosis not present

## 2023-01-03 DIAGNOSIS — N186 End stage renal disease: Secondary | ICD-10-CM | POA: Diagnosis not present

## 2023-01-03 DIAGNOSIS — Z992 Dependence on renal dialysis: Secondary | ICD-10-CM | POA: Diagnosis not present

## 2023-01-04 DIAGNOSIS — I132 Hypertensive heart and chronic kidney disease with heart failure and with stage 5 chronic kidney disease, or end stage renal disease: Secondary | ICD-10-CM | POA: Diagnosis not present

## 2023-01-04 DIAGNOSIS — S72141D Displaced intertrochanteric fracture of right femur, subsequent encounter for closed fracture with routine healing: Secondary | ICD-10-CM | POA: Diagnosis not present

## 2023-01-04 DIAGNOSIS — D631 Anemia in chronic kidney disease: Secondary | ICD-10-CM | POA: Diagnosis not present

## 2023-01-04 DIAGNOSIS — I5022 Chronic systolic (congestive) heart failure: Secondary | ICD-10-CM | POA: Diagnosis not present

## 2023-01-04 DIAGNOSIS — N186 End stage renal disease: Secondary | ICD-10-CM | POA: Diagnosis not present

## 2023-01-04 DIAGNOSIS — E1122 Type 2 diabetes mellitus with diabetic chronic kidney disease: Secondary | ICD-10-CM | POA: Diagnosis not present

## 2023-01-05 DIAGNOSIS — D631 Anemia in chronic kidney disease: Secondary | ICD-10-CM | POA: Diagnosis not present

## 2023-01-05 DIAGNOSIS — Z992 Dependence on renal dialysis: Secondary | ICD-10-CM | POA: Diagnosis not present

## 2023-01-05 DIAGNOSIS — N250 Renal osteodystrophy: Secondary | ICD-10-CM | POA: Diagnosis not present

## 2023-01-05 DIAGNOSIS — N186 End stage renal disease: Secondary | ICD-10-CM | POA: Diagnosis not present

## 2023-01-05 DIAGNOSIS — D509 Iron deficiency anemia, unspecified: Secondary | ICD-10-CM | POA: Diagnosis not present

## 2023-01-06 DIAGNOSIS — I132 Hypertensive heart and chronic kidney disease with heart failure and with stage 5 chronic kidney disease, or end stage renal disease: Secondary | ICD-10-CM | POA: Diagnosis not present

## 2023-01-06 DIAGNOSIS — I5022 Chronic systolic (congestive) heart failure: Secondary | ICD-10-CM | POA: Diagnosis not present

## 2023-01-06 DIAGNOSIS — D631 Anemia in chronic kidney disease: Secondary | ICD-10-CM | POA: Diagnosis not present

## 2023-01-06 DIAGNOSIS — N186 End stage renal disease: Secondary | ICD-10-CM | POA: Diagnosis not present

## 2023-01-06 DIAGNOSIS — E1122 Type 2 diabetes mellitus with diabetic chronic kidney disease: Secondary | ICD-10-CM | POA: Diagnosis not present

## 2023-01-06 DIAGNOSIS — S72141D Displaced intertrochanteric fracture of right femur, subsequent encounter for closed fracture with routine healing: Secondary | ICD-10-CM | POA: Diagnosis not present

## 2023-01-07 ENCOUNTER — Telehealth: Payer: Self-pay | Admitting: Internal Medicine

## 2023-01-07 DIAGNOSIS — D631 Anemia in chronic kidney disease: Secondary | ICD-10-CM | POA: Diagnosis not present

## 2023-01-07 DIAGNOSIS — Z992 Dependence on renal dialysis: Secondary | ICD-10-CM | POA: Diagnosis not present

## 2023-01-07 DIAGNOSIS — D509 Iron deficiency anemia, unspecified: Secondary | ICD-10-CM | POA: Diagnosis not present

## 2023-01-07 DIAGNOSIS — N186 End stage renal disease: Secondary | ICD-10-CM | POA: Diagnosis not present

## 2023-01-07 DIAGNOSIS — I429 Cardiomyopathy, unspecified: Secondary | ICD-10-CM

## 2023-01-07 DIAGNOSIS — I5022 Chronic systolic (congestive) heart failure: Secondary | ICD-10-CM

## 2023-01-07 DIAGNOSIS — N250 Renal osteodystrophy: Secondary | ICD-10-CM | POA: Diagnosis not present

## 2023-01-07 NOTE — Telephone Encounter (Signed)
Matthew [name] from [place] called wanting home health orders to add nursing for a pressure ulcer on his buttocks.  Also wanted to inform us that he is 30% weight bearing but he is not tolerating anything.  They also would like an order for a wheelchair.   Callback # [phone]

## 2023-01-08 ENCOUNTER — Encounter: Payer: Self-pay | Admitting: *Deleted

## 2023-01-08 ENCOUNTER — Inpatient Hospital Stay
Admission: EM | Admit: 2023-01-08 | Discharge: 2023-01-10 | DRG: 559 | Disposition: A | Discharge status: Other Acute Inpatient Hospital | Payer: Medicare Other | Attending: Obstetrics and Gynecology | Admitting: Obstetrics and Gynecology

## 2023-01-08 ENCOUNTER — Emergency Department: Payer: Medicare Other

## 2023-01-08 ENCOUNTER — Other Ambulatory Visit: Payer: Self-pay

## 2023-01-08 DIAGNOSIS — Z85828 Personal history of other malignant neoplasm of skin: Secondary | ICD-10-CM | POA: Diagnosis not present

## 2023-01-08 DIAGNOSIS — I5020 Unspecified systolic (congestive) heart failure: Secondary | ICD-10-CM | POA: Diagnosis present

## 2023-01-08 DIAGNOSIS — I120 Hypertensive chronic kidney disease with stage 5 chronic kidney disease or end stage renal disease: Secondary | ICD-10-CM

## 2023-01-08 DIAGNOSIS — I9589 Other hypotension: Secondary | ICD-10-CM | POA: Diagnosis not present

## 2023-01-08 DIAGNOSIS — Z7984 Long term (current) use of oral hypoglycemic drugs: Secondary | ICD-10-CM | POA: Diagnosis not present

## 2023-01-08 DIAGNOSIS — M80051D Age-related osteoporosis with current pathological fracture, right femur, subsequent encounter for fracture with routine healing: Secondary | ICD-10-CM

## 2023-01-08 DIAGNOSIS — Z880 Allergy status to penicillin: Secondary | ICD-10-CM

## 2023-01-08 DIAGNOSIS — Z87891 Personal history of nicotine dependence: Secondary | ICD-10-CM | POA: Diagnosis not present

## 2023-01-08 DIAGNOSIS — M25551 Pain in right hip: Secondary | ICD-10-CM

## 2023-01-08 DIAGNOSIS — I4819 Other persistent atrial fibrillation: Secondary | ICD-10-CM | POA: Diagnosis not present

## 2023-01-08 DIAGNOSIS — I5022 Chronic systolic (congestive) heart failure: Secondary | ICD-10-CM | POA: Diagnosis present

## 2023-01-08 DIAGNOSIS — S72001A Fracture of unspecified part of neck of right femur, initial encounter for closed fracture: Secondary | ICD-10-CM | POA: Diagnosis not present

## 2023-01-08 DIAGNOSIS — I2510 Atherosclerotic heart disease of native coronary artery without angina pectoris: Secondary | ICD-10-CM | POA: Diagnosis not present

## 2023-01-08 DIAGNOSIS — S72001G Fracture of unspecified part of neck of right femur, subsequent encounter for closed fracture with delayed healing: Secondary | ICD-10-CM

## 2023-01-08 DIAGNOSIS — I132 Hypertensive heart and chronic kidney disease with heart failure and with stage 5 chronic kidney disease, or end stage renal disease: Secondary | ICD-10-CM | POA: Diagnosis present

## 2023-01-08 DIAGNOSIS — Z992 Dependence on renal dialysis: Secondary | ICD-10-CM

## 2023-01-08 DIAGNOSIS — I959 Hypotension, unspecified: Secondary | ICD-10-CM | POA: Diagnosis not present

## 2023-01-08 DIAGNOSIS — N2581 Secondary hyperparathyroidism of renal origin: Secondary | ICD-10-CM | POA: Diagnosis present

## 2023-01-08 DIAGNOSIS — D631 Anemia in chronic kidney disease: Secondary | ICD-10-CM | POA: Diagnosis not present

## 2023-01-08 DIAGNOSIS — D696 Thrombocytopenia, unspecified: Secondary | ICD-10-CM | POA: Diagnosis present

## 2023-01-08 DIAGNOSIS — Z66 Do not resuscitate: Secondary | ICD-10-CM | POA: Diagnosis not present

## 2023-01-08 DIAGNOSIS — R296 Repeated falls: Secondary | ICD-10-CM | POA: Diagnosis present

## 2023-01-08 DIAGNOSIS — Z79890 Hormone replacement therapy (postmenopausal): Secondary | ICD-10-CM | POA: Diagnosis not present

## 2023-01-08 DIAGNOSIS — E785 Hyperlipidemia, unspecified: Secondary | ICD-10-CM | POA: Diagnosis not present

## 2023-01-08 DIAGNOSIS — G4700 Insomnia, unspecified: Secondary | ICD-10-CM | POA: Diagnosis present

## 2023-01-08 DIAGNOSIS — M8000XD Age-related osteoporosis with current pathological fracture, unspecified site, subsequent encounter for fracture with routine healing: Secondary | ICD-10-CM

## 2023-01-08 DIAGNOSIS — Z8616 Personal history of COVID-19: Secondary | ICD-10-CM | POA: Diagnosis not present

## 2023-01-08 DIAGNOSIS — S72001P Fracture of unspecified part of neck of right femur, subsequent encounter for closed fracture with malunion: Secondary | ICD-10-CM | POA: Diagnosis not present

## 2023-01-08 DIAGNOSIS — R937 Abnormal findings on diagnostic imaging of other parts of musculoskeletal system: Secondary | ICD-10-CM | POA: Diagnosis not present

## 2023-01-08 DIAGNOSIS — M9689 Other intraoperative and postprocedural complications and disorders of the musculoskeletal system: Principal | ICD-10-CM

## 2023-01-08 DIAGNOSIS — E119 Type 2 diabetes mellitus without complications: Secondary | ICD-10-CM

## 2023-01-08 DIAGNOSIS — S72009A Fracture of unspecified part of neck of unspecified femur, initial encounter for closed fracture: Secondary | ICD-10-CM | POA: Diagnosis present

## 2023-01-08 DIAGNOSIS — S72001S Fracture of unspecified part of neck of right femur, sequela: Secondary | ICD-10-CM

## 2023-01-08 DIAGNOSIS — R9431 Abnormal electrocardiogram [ECG] [EKG]: Secondary | ICD-10-CM | POA: Diagnosis not present

## 2023-01-08 DIAGNOSIS — I71019 Dissection of thoracic aorta, unspecified: Secondary | ICD-10-CM | POA: Diagnosis not present

## 2023-01-08 DIAGNOSIS — M25572 Pain in left ankle and joints of left foot: Secondary | ICD-10-CM | POA: Diagnosis not present

## 2023-01-08 DIAGNOSIS — Z951 Presence of aortocoronary bypass graft: Secondary | ICD-10-CM

## 2023-01-08 DIAGNOSIS — Z9049 Acquired absence of other specified parts of digestive tract: Secondary | ICD-10-CM

## 2023-01-08 DIAGNOSIS — E039 Hypothyroidism, unspecified: Secondary | ICD-10-CM | POA: Diagnosis present

## 2023-01-08 DIAGNOSIS — I480 Paroxysmal atrial fibrillation: Secondary | ICD-10-CM | POA: Diagnosis present

## 2023-01-08 DIAGNOSIS — I951 Orthostatic hypotension: Secondary | ICD-10-CM | POA: Diagnosis present

## 2023-01-08 DIAGNOSIS — G8918 Other acute postprocedural pain: Secondary | ICD-10-CM | POA: Diagnosis not present

## 2023-01-08 DIAGNOSIS — Y792 Prosthetic and other implants, materials and accessory orthopedic devices associated with adverse incidents: Secondary | ICD-10-CM | POA: Diagnosis present

## 2023-01-08 DIAGNOSIS — N186 End stage renal disease: Secondary | ICD-10-CM | POA: Diagnosis not present

## 2023-01-08 DIAGNOSIS — I953 Hypotension of hemodialysis: Secondary | ICD-10-CM | POA: Diagnosis not present

## 2023-01-08 DIAGNOSIS — I4891 Unspecified atrial fibrillation: Secondary | ICD-10-CM | POA: Diagnosis present

## 2023-01-08 DIAGNOSIS — E1122 Type 2 diabetes mellitus with diabetic chronic kidney disease: Secondary | ICD-10-CM | POA: Diagnosis not present

## 2023-01-08 DIAGNOSIS — Z888 Allergy status to other drugs, medicaments and biological substances status: Secondary | ICD-10-CM

## 2023-01-08 DIAGNOSIS — I482 Chronic atrial fibrillation, unspecified: Secondary | ICD-10-CM | POA: Diagnosis present

## 2023-01-08 DIAGNOSIS — S72141A Displaced intertrochanteric fracture of right femur, initial encounter for closed fracture: Secondary | ICD-10-CM | POA: Diagnosis not present

## 2023-01-08 DIAGNOSIS — K219 Gastro-esophageal reflux disease without esophagitis: Secondary | ICD-10-CM | POA: Diagnosis present

## 2023-01-08 DIAGNOSIS — I25810 Atherosclerosis of coronary artery bypass graft(s) without angina pectoris: Secondary | ICD-10-CM | POA: Diagnosis not present

## 2023-01-08 DIAGNOSIS — Z8249 Family history of ischemic heart disease and other diseases of the circulatory system: Secondary | ICD-10-CM

## 2023-01-08 DIAGNOSIS — T84124A Displacement of internal fixation device of right femur, initial encounter: Secondary | ICD-10-CM | POA: Diagnosis not present

## 2023-01-08 DIAGNOSIS — Z79899 Other long term (current) drug therapy: Secondary | ICD-10-CM

## 2023-01-08 DIAGNOSIS — I10 Essential (primary) hypertension: Secondary | ICD-10-CM | POA: Diagnosis not present

## 2023-01-08 DIAGNOSIS — I252 Old myocardial infarction: Secondary | ICD-10-CM

## 2023-01-08 DIAGNOSIS — Z7902 Long term (current) use of antithrombotics/antiplatelets: Secondary | ICD-10-CM

## 2023-01-08 LAB — CBC WITH DIFFERENTIAL/PLATELET
Abs Immature Granulocytes: 0.02 10*3/uL (ref 0.00–0.07)
Basophils Absolute: 0 10*3/uL (ref 0.0–0.1)
Basophils Relative: 1 %
Eosinophils Absolute: 0.3 10*3/uL (ref 0.0–0.5)
Eosinophils Relative: 4 %
HCT: 35.5 % — ABNORMAL LOW (ref 39.0–52.0)
Hemoglobin: 11.2 g/dL — ABNORMAL LOW (ref 13.0–17.0)
Immature Granulocytes: 0 %
Lymphocytes Relative: 10 %
Lymphs Abs: 0.6 10*3/uL — ABNORMAL LOW (ref 0.7–4.0)
MCH: 31.5 pg (ref 26.0–34.0)
MCHC: 31.5 g/dL (ref 30.0–36.0)
MCV: 99.7 fL (ref 80.0–100.0)
Monocytes Absolute: 1 10*3/uL (ref 0.1–1.0)
Monocytes Relative: 16 %
Neutro Abs: 4.2 10*3/uL (ref 1.7–7.7)
Neutrophils Relative %: 69 %
Platelets: 131 10*3/uL — ABNORMAL LOW (ref 150–400)
RBC: 3.56 MIL/uL — ABNORMAL LOW (ref 4.22–5.81)
RDW: 15.9 % — ABNORMAL HIGH (ref 11.5–15.5)
WBC: 6 10*3/uL (ref 4.0–10.5)
nRBC: 0 % (ref 0.0–0.2)

## 2023-01-08 LAB — GLUCOSE, CAPILLARY: Glucose-Capillary: 78 mg/dL (ref 70–99)

## 2023-01-08 LAB — BASIC METABOLIC PANEL
Anion gap: 12 (ref 5–15)
BUN: 65 mg/dL — ABNORMAL HIGH (ref 8–23)
CO2: 28 mmol/L (ref 22–32)
Calcium: 8.7 mg/dL — ABNORMAL LOW (ref 8.9–10.3)
Chloride: 94 mmol/L — ABNORMAL LOW (ref 98–111)
Creatinine, Ser: 2.93 mg/dL — ABNORMAL HIGH (ref 0.61–1.24)
GFR, Estimated: 20 mL/min — ABNORMAL LOW (ref >60)
Glucose, Bld: 154 mg/dL — ABNORMAL HIGH (ref 70–99)
Potassium: 4.4 mmol/L (ref 3.5–5.1)
Sodium: 134 mmol/L — ABNORMAL LOW (ref 135–145)

## 2023-01-08 MED ORDER — HYDROCODONE-ACETAMINOPHEN 5-325 MG PO TABS
1.0000 | ORAL_TABLET | Freq: Four times a day (QID) | ORAL | Status: DC | PRN
  Administered 2023-01-09 – 2023-01-10 (×3): 1 via ORAL
  Filled 2023-01-08 (×3): qty 1

## 2023-01-08 MED ORDER — BISACODYL 5 MG PO TBEC: 5.0000 mg | DELAYED_RELEASE_TABLET | Freq: Every day | ORAL | Status: DC | PRN

## 2023-01-08 MED ORDER — HEPARIN SODIUM (PORCINE) 5000 UNIT/ML IJ SOLN
5000.0000 [IU] | Freq: Two times a day (BID) | INTRAMUSCULAR | Status: DC
  Administered 2023-01-08 – 2023-01-09 (×3): 5000 [IU] via SUBCUTANEOUS
  Filled 2023-01-08 (×3): qty 1

## 2023-01-08 MED ORDER — LEVOTHYROXINE SODIUM 100 MCG PO TABS: 100.0000 ug | ORAL_TABLET | Freq: Every morning | ORAL | Status: DC

## 2023-01-08 MED ORDER — SOTALOL HCL 80 MG PO TABS
80.0000 mg | ORAL_TABLET | Freq: Two times a day (BID) | ORAL | Status: DC
  Administered 2023-01-09 (×2): 80 mg via ORAL
  Filled 2023-01-08 (×5): qty 1

## 2023-01-08 MED ORDER — SENNOSIDES-DOCUSATE SODIUM 8.6-50 MG PO TABS: 1.0000 | ORAL_TABLET | Freq: Every evening | ORAL | Status: DC | PRN

## 2023-01-08 MED ORDER — CLOPIDOGREL BISULFATE 75 MG PO TABS
75.0000 mg | ORAL_TABLET | Freq: Every day | ORAL | Status: DC
  Administered 2023-01-09: 75 mg via ORAL
  Filled 2023-01-08: qty 1

## 2023-01-08 MED ORDER — HYDROMORPHONE HCL 1 MG/ML IJ SOLN
0.5000 mg | INTRAMUSCULAR | Status: DC | PRN
  Administered 2023-01-08: 0.5 mg via INTRAVENOUS
  Filled 2023-01-08 (×2): qty 0.5

## 2023-01-08 MED ORDER — LORATADINE 10 MG PO TABS
10.0000 mg | ORAL_TABLET | Freq: Every day | ORAL | Status: DC
  Administered 2023-01-09: 10 mg via ORAL
  Filled 2023-01-08 (×2): qty 1

## 2023-01-08 MED ORDER — POLYETHYLENE GLYCOL 3350 17 G PO PACK
17.0000 g | PACK | Freq: Every day | ORAL | Status: DC
  Administered 2023-01-09: 17 g via ORAL
  Filled 2023-01-08 (×2): qty 1

## 2023-01-08 MED ORDER — ROSUVASTATIN CALCIUM 20 MG PO TABS: 40.0000 mg | ORAL_TABLET | Freq: Every day | ORAL | Status: DC

## 2023-01-08 MED ORDER — FLUTICASONE PROPIONATE 50 MCG/ACT NA SUSP
2.0000 | Freq: Every day | NASAL | Status: DC | PRN
  Filled 2023-01-08: qty 16

## 2023-01-08 MED ORDER — SENNOSIDES-DOCUSATE SODIUM 8.6-50 MG PO TABS
2.0000 | ORAL_TABLET | Freq: Two times a day (BID) | ORAL | Status: DC
  Administered 2023-01-08 – 2023-01-09 (×3): 2 via ORAL
  Filled 2023-01-08 (×3): qty 2

## 2023-01-08 MED ORDER — VITAMIN D 25 MCG (1000 UNIT) PO TABS
1000.0000 [IU] | ORAL_TABLET | Freq: Every day | ORAL | Status: DC
  Administered 2023-01-09: 1000 [IU] via ORAL
  Filled 2023-01-08: qty 1

## 2023-01-08 NOTE — ED Notes (Signed)
Family at bedside and requested a warm blanket for the patient who is sleeping.

## 2023-01-08 NOTE — Consult Note (Signed)
ORTHOPAEDIC CONSULTATION  REQUESTING PHYSICIAN: Emeline General, MD  Chief Complaint: Right hip pain  HPI: Matthew [name] is a 89 y.o. male who complains of right hip pain.  On 11/12/2022 the patient underwent right hip nailing by Dr. Martha Clan for an intertrochanteric fracture.  He progressed satisfactorily for the most part.  However recently he has developed increased right hip pain.  He saw Dr. Collier Flowers in the office 2 days ago where x-rays showed the nail penetrating right up to the edge of the femoral head.  He was advised to remain nonweightbearing but is unable to do so at home.  He was brought to the emergency room today for further care.  I spoke to Dr. Martha Clan who agrees that it is appropriate to admit the patient for pain control and possible skilled nursing placement.  He also may decide to have the patient transferred for further surgical treatment.  Past Medical History:  Diagnosis Date   Anginal pain (HCC)    Arthritis    Ascending aortic aneurysm (HCC) 07/18/2015   a.) TTE 07/18/2015: severe dilation of ascending aorta measuring 7.0 cm. b.) TTE 01/10/2018: aortic root 3.8 cm; ascending aorta 6.8 cm; refused surgical intervention/repair.   Atrial fibrillation (HCC)    a.) CHA2DS2-VASc = 6 (age x 2, HFrEF, HTN, previous MI, T2DM). b.) rate/rhythm maintained on amiodarone + metoprolol succinate; chronic antiplatelet therapy using clopidogrel   Bradycardia    Broken arm 05/2013   Left   Cardiac murmur    a.) RIGHT upper sternal border   Cardiomyopathy [facility])    Coronary artery disease    DOE (dyspnea on exertion)    ESRD (end stage renal disease) (HCC)    GERD (gastroesophageal reflux disease)    HFrEF (heart failure with reduced ejection fraction) (HCC)    a.) TTE 12/03/2013: mod dec LV function; EF 35-40%; severe inferolateral and inferior HK; LA dilated; G3DD; PASP 52 mmHg. b.) TTE 07/18/2015: mild LV dysfunction with mild LVH; EF 45%; mild BAE. c.) TTE 01/10/2018: mod LV  dysfunction; EF 45%; diffuse HK   HLD (hyperlipidemia)    Hypertension    IDA (iron deficiency anemia)    Long term current use of antithrombotics/antiplatelets    a.) clopidogrel   Myocardial infarction (HCC) 2000   Peripheral edema    Pneumonia    S/P CABG x 4 2001   a.) LIMA-LAD, SVG-D1, SVG-OM1, SVG-OM2, SVG-PDA   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   left frontal scalp, EDC   Squamous cell carcinoma of skin 05/05/2020   left temporal scalp, in situ, EDC 05/13/20   T2DM (type 2 diabetes mellitus) (HCC)    Valvular insufficiency 12/03/2013   a.) TTE 12/03/2013: EF 35-40%; mild AR/MR, b.) TTE 07/18/2015: EF 45%; mild AR/PR, mod TR, severe MR. c.) TTE 01/10/2018: mod AR/TR   Past Surgical History:  Procedure Laterality Date   A/V SHUNTOGRAM Left 04/01/2022   Procedure: A/V SHUNTOGRAM;  Surgeon: Annice Needy, MD;  Location: [facility] INVASIVE CV LAB;  Service: Cardiovascular;  Laterality: Left;   AV FISTULA PLACEMENT Left 04/15/2021   Procedure: INSERTION OF ARTERIOVENOUS (AV) GORE-TEX GRAFT ARM ( BRACHIAL AXILLARY);  Surgeon: Annice Needy, MD;  Location: [facility] ORS;  Service: Vascular;  Laterality: Left;   CATARACT EXTRACTION W/PHACO Left 07/26/2017   Procedure: CATARACT EXTRACTION PHACO AND INTRAOCULAR LENS PLACEMENT (IOC);  Surgeon: Galen Manila, MD;  Location: [facility] ORS;  Service: Ophthalmology;  Laterality: Left;  [place]   00:45.8 AP%  13.1 CDE  6.00 Fluid Pack Lot # [id number]   CATARACT EXTRACTION W/PHACO Right 08/17/2017   Procedure: CATARACT EXTRACTION PHACO AND INTRAOCULAR LENS PLACEMENT (IOC);  Surgeon: Galen Manila, MD;  Location: [facility] ORS;  Service: Ophthalmology;  Laterality: Right;  [place]  01:30 AP% 16.8 CDE 15.24 Fluid pack lot # [id number]   CHOLECYSTECTOMY     CORONARY ARTERY BYPASS GRAFT N/A 2001   Procedure: 5v CABG (LIMA-LAD, SVG-D1, SVG-OM1, SVG-OM2, SVG-PDA)   DIALYSIS/PERMA CATHETER INSERTION N/A 02/09/2018   Procedure: DIALYSIS/PERMA CATHETER INSERTION;   Surgeon: Annice Needy, MD;  Location: [facility] INVASIVE CV LAB;  Service: Cardiovascular;  Laterality: N/A;   DIALYSIS/PERMA CATHETER REMOVAL N/A 06/25/2021   Procedure: DIALYSIS/PERMA CATHETER REMOVAL;  Surgeon: Annice Needy, MD;  Location: [facility] INVASIVE CV LAB;  Service: Cardiovascular;  Laterality: N/A;   FRACTURE SURGERY Left 2015   LEFT arm   INSERTION OF DIALYSIS CATHETER Right 11/03/2020   Procedure: INSERTION OF Perm Cath in the RIGHT INTERNAL JUGULAR;  Surgeon: Annice Needy, MD;  Location: [facility] ORS;  Service: Vascular;  Laterality: Right;   INTRAMEDULLARY (IM) NAIL INTERTROCHANTERIC Right 11/12/2022   Procedure: INTRAMEDULLARY (IM) NAIL INTERTROCHANTERIC;  Surgeon: Juanell Fairly, MD;  Location: [facility] ORS;  Service: Orthopedics;  Laterality: Right;   LEFT HEART CATHETERIZATION WITH CORONARY/GRAFT ANGIOGRAM Left 12/04/2013   Procedure: LEFT HEART CATHETERIZATION WITH Isabel Caprice;  Surgeon: Marykay Lex, MD;  Location: [facility] CATH LAB;  Service: Cardiovascular;  Laterality: Left;   LEFT HEART CATHETERIZATION WITH CORONARY/GRAFT ANGIOGRAM Left 02/06/2009   Procedure: LEFT HEART CATHETERIZATION WITH CORONARY/GRAFT ANGIOGRAM; Location: [facility]; Surgeon: Despina Hick, MD   PERCUTANEOUS CORONARY STENT INTERVENTION (PCI-S) N/A 12/06/2013   Procedure: STAGED PERCUTANEOUS CORONARY STENT INTERVENTION (overlapping 4.0 x 38 mm (mid) and 4.0 x 28 mm (ostial) Promus Primier DES to SVG-RPDA graft);  Surgeon: Lesleigh Noe, MD;  Location: [facility] CATH LAB;  Service: Cardiovascular   REMOVAL OF A DIALYSIS CATHETER N/A 11/03/2020   Procedure: REMOVAL OF A DIALYSIS CATHETER;  Surgeon: Annice Needy, MD;  Location: [facility] ORS;  Service: Vascular;  Laterality: N/A;   Social History   Socioeconomic History   Marital status: Widowed    Spouse name: Not on file   Number of children: Not on file   Years of education: Not on file   Highest education level: Not on file  Occupational History   Not on file   Tobacco Use   Smoking status: Former    Current packs/day: 0.00    Types: Pipe, Cigarettes    Quit date: 74    Years since quitting: 32.6   Smokeless tobacco: Never  Vaping Use   Vaping status: Never Used  Substance and Sexual Activity   Alcohol use: Not Currently    Alcohol/week: 1.0 standard drink of alcohol    Types: 1 Cans of beer per week    Comment: occasionally drinking only,once a month   Drug use: No   Sexual activity: Never  Other Topics Concern   Not on file  Social History Narrative   Lives with daughter and son in law   Social Determinants of Health   Financial Resource Strain: Low Risk  (01/10/2018)   Overall Financial Resource Strain (CARDIA)    Difficulty of Paying Living Expenses: Not hard at all  Food Insecurity: No Food Insecurity (11/12/2022)   Hunger Vital Sign    Worried About Running Out of Food in the Last Year: Never true    Ran Out of Food in the  Last Year: Never true  Transportation Needs: No Transportation Needs (11/12/2022)   PRAPARE - Administrator, Civil Service (Medical): No    Lack of Transportation (Non-Medical): No  Physical Activity: Inactive (01/10/2018)   Exercise Vital Sign    Days of Exercise per Week: 0 days    Minutes of Exercise per Session: 0 min  Stress: No Stress Concern Present (01/10/2018)   [employer] of Occupational Health - Occupational Stress Questionnaire    Feeling of Stress : Not at all  Social Connections: Socially Integrated (01/10/2018)   Social Connection and Isolation Panel [NHANES]    Frequency of Communication with Friends and Family: More than three times a week    Frequency of Social Gatherings with Friends and Family: More than three times a week    Attends Religious Services: More than 4 times per year    Active Member of [employer] or Organizations: Yes    Attends Engineer, structural: More than 4 times per year    Marital Status: Married   Family History  Problem Relation Age of  Onset   Heart attack Father 75       died first MI at age 42   Heart failure Mother    Allergies  Allergen Reactions   Other     Other reaction(s): Myalgias (intolerance), Other (See Comments) Statin Drugs  Statin Drugs     Statins    Amoxicillin Diarrhea   Prior to Admission medications   Medication Sig Start Date End Date Taking? Authorizing Provider  cetirizine (ZYRTEC) 10 MG tablet Take 10 mg by mouth daily.   Yes [provider]  cholecalciferol (VITAMIN D) 25 MCG (1000 UNIT) tablet Take 1,000 Units by mouth daily with breakfast.   Yes [provider]  clopidogrel (PLAVIX) 75 MG tablet Take 1 tablet (75 mg total) by mouth daily. 12/02/22  Yes Laurier Nancy, MD  ferrous sulfate 325 (65 FE) MG tablet Take 325 mg by mouth 2 (two) times daily with a meal.    Yes [provider]  fluticasone (FLONASE) 50 MCG/ACT nasal spray Place 2 sprays into both nostrils daily as needed for allergies or rhinitis.   Yes [provider]  furosemide (LASIX) 40 MG tablet Take 40 mg by mouth.   Yes [provider]  glipiZIDE (GLUCOTROL) 5 MG tablet Take 2.5 mg by mouth daily before breakfast. 12/31/22  Yes [provider]  levothyroxine (SYNTHROID) 88 MCG tablet Take 88 mcg by mouth daily before breakfast.   Yes [provider]  lidocaine-prilocaine (EMLA) cream Apply 1 Application topically as needed (before dialysis).   Yes [provider]  midodrine (PROAMATINE) 5 MG tablet Take 1 tablet (5 mg total) by mouth 3 (three) times daily with meals. Patient taking differently: Take 5 mg by mouth. 5MG  1 HOUR BEFORE DIALYSIS 02/17/21  Yes Delfino Lovett, MD  multivitamin (RENA-VIT) TABS tablet Take 1 tablet by mouth daily.   Yes [provider]  potassium chloride SA (KLOR-CON) 20 MEQ tablet Take 40 mEq by mouth daily.   Yes [provider]  sotalol (BETAPACE) 80 MG tablet Take 1 tablet (80 mg total) by mouth 2 (two) times  daily. 10/14/22  Yes Laurier Nancy, MD  Suvorexant (BELSOMRA) 10 MG TABS Take 10 mg by mouth at bedtime.   Yes [provider]  acyclovir ointment (ZOVIRAX) 5 % Apply 1 application topically daily as needed (shingles). Patient not taking: Reported on 11/12/2022  [provider]  bisacodyl (DULCOLAX) 10 MG suppository Place 1 suppository (10 mg total) rectally daily as needed for moderate constipation. Patient not taking: Reported on 11/12/2022 01/05/21   Enedina Finner, MD  levothyroxine (SYNTHROID) 100 MCG tablet TAKE 1 TABLET BY MOUTH IN THE  MORNING Patient not taking: Reported on 01/08/2023 12/31/22   Sherron Monday, MD  nitroGLYCERIN (NITROSTAT) 0.4 MG SL tablet Place 0.4 mg under the tongue every 5 (five) minutes as needed for chest pain.    [provider]  Omega-3 Fatty Acids (FISH OIL) 1000 MG CAPS Take 4,000 mg by mouth daily with lunch.    [provider]  polyethylene glycol (MIRALAX / GLYCOLAX) 17 g packet Take 17 g by mouth daily. 01/06/21   Enedina Finner, MD  rosuvastatin (CRESTOR) 40 MG tablet TAKE 1 TABLET BY MOUTH ONCE  DAILY Patient not taking: Reported on 01/08/2023 10/28/22   Laurier Nancy, MD  senna-docusate (SENOKOT-S) 8.6-50 MG tablet Take 2 tablets by mouth 2 (two) times daily. 01/05/21   Enedina Finner, MD   DG Femur Portable Min 2 Views Right  Result Date: 01/08/2023 CLINICAL DATA:  Right hip surgery in June with worsening pain in the right hip over the past week. EXAM: RIGHT FEMUR PORTABLE 2 VIEW COMPARISON:  Right hip radiographs dated 11/12/2022. FINDINGS: The patient is status post placement of a femoral shaft intramedullary nail and femoral neck screw on the right. Compared to 11/12/2022, the femoral neck screw has advanced with the tip now extending slightly beyond the right femoral head and into the right hip joint space. No new fracture is identified, however there is suggestion of compression of the intertrochanteric fracture of the right  femur compared to prior exam. A displaced fracture fragment of the right lesser trochanter is redemonstrated. IMPRESSION: Suggestion of compression of the intertrochanteric fracture of the right femur compared to prior exam as well as interval advancement of the femoral neck screw with the tip now extending slightly beyond the right femoral head and into the right hip joint space. Electronically Signed   By: Romona Curls M.D.   On: 01/08/2023 15:01   DG Hip Unilat W or Wo Pelvis 2-3 Views Right  Result Date: 01/08/2023 CLINICAL DATA:  Right hip surgery in June with worsening pain in the right hip over the past week. EXAM: DG HIP (WITH OR WITHOUT PELVIS) 2-3V RIGHT COMPARISON:  Right hip radiographs dated 11/12/2022. FINDINGS: The patient is status post placement of a femoral shaft intramedullary nail and femoral neck screw on the right. Compared to 11/12/2022, the femoral neck screw has advanced with the tip now extending slightly beyond the right femoral head and into the right hip joint space. No new fracture is identified, however there is suggestion of compression of the intertrochanteric fracture of the right femur compared to prior exam. A displaced fracture fragment of the right lesser trochanter is redemonstrated. IMPRESSION: Suggestion of compression of the intertrochanteric fracture of the right femur compared to prior exam as well as interval advancement of the femoral neck screw with the tip now extending slightly beyond the right femoral head and into the right hip joint space. Electronically Signed   By: Romona Curls M.D.   On: 01/08/2023 15:00    Positive ROS: All other systems have been reviewed and were otherwise negative with the exception of those mentioned in the HPI and as above.  Physical Exam: General: Alert, no acute distress Cardiovascular: No pedal edema Respiratory: No cyanosis, no  use of accessory musculature GI: No organomegaly, abdomen is soft and non-tender Skin: No  lesions in the area of chief complaint Neurologic: Sensation intact distally Psychiatric: Patient is competent for consent with normal mood and affect Lymphatic: No axillary or cervical lymphadenopathy  MUSCULOSKELETAL: Patient is alert oriented and sitting in his hospital bed.  He is just finished dinner.  Wounds are well-healed on his right hip.  Passive motion of the hip does not produce any significant grinding or grating.  There is mild discomfort.  Neurovascular status good distally.  Assessment: Right hip pain secondary to femoral nail migration  Plan: Recommend pain control. He should be totally nonweightbearing with PT. He will need skilled nursing placement.     Valinda Hoar, MD [phone]   01/08/2023 8:27 PM

## 2023-01-08 NOTE — H&P (Signed)
History and Physical    Matthew [name]:[id number] DOB: 09-29-1930 DOA: 01/08/2023  PCP: Sherron Monday, MD (Confirm with patient/family/NH records and if not entered, this has to be entered at [facility] point of entry) Patient coming from: Home  I have personally briefly reviewed patient's old medical records in [facility]  Chief Complaint: Right hip pain  HPI: Matthew [name] is a 89 y.o. male with medical history significant of osteoporosis, recent right hip fracture status post intramedullary rod and femoral neck screw, ESRD on HD MWF, chronic orthostatic hypotension on as needed midodrine, PAF not on anticoagulation due to fall risk, IIDM, presented with worsening right hip pain.  Patient recently underwent right hip surgery with intramedullary rod and neck screw after a fall and fracture of right hip and went to local SNF for rehab.  SNF stay was prolonged due to COVID infection was treated with Paxlovid.  Patient was discharged home from SNF 2 weeks ago but's ambulation significant deteriorated compared to when he was in the nursing home.  No significant fall.  This week patient has had significant difficulty to walk up stairs and PT sent him to see orthopedic surgery on Tuesday.  X-ray showed concerning signs for loosening of the screws and orthopedic surgeon recommended patient only does 30% weightbearing on right leg with the aid of a rolling walker.  Patient followed the instruction however the pain became unbearable and even standing up caused significant pain for the last couple of days.  Patient is ESRD on HD and went regular dialysis yesterday. ED Course: Afebrile not hypotensive.  Right hip x-ray showed compression of intratrochanteric fracture of the right femur and interval advancement of the femoral neck screw extending slightly beyond the right femoral head into the right hip joint space.  Review of Systems: As per HPI otherwise 14 point review of systems negative.     Past Medical History:  Diagnosis Date   Anginal pain (HCC)    Arthritis    Ascending aortic aneurysm (HCC) 07/18/2015   a.) TTE 07/18/2015: severe dilation of ascending aorta measuring 7.0 cm. b.) TTE 01/10/2018: aortic root 3.8 cm; ascending aorta 6.8 cm; refused surgical intervention/repair.   Atrial fibrillation (HCC)    a.) CHA2DS2-VASc = 6 (age x 2, HFrEF, HTN, previous MI, T2DM). b.) rate/rhythm maintained on amiodarone + metoprolol succinate; chronic antiplatelet therapy using clopidogrel   Bradycardia    Broken arm 05/2013   Left   Cardiac murmur    a.) RIGHT upper sternal border   Cardiomyopathy [facility])    Coronary artery disease    DOE (dyspnea on exertion)    ESRD (end stage renal disease) (HCC)    GERD (gastroesophageal reflux disease)    HFrEF (heart failure with reduced ejection fraction) (HCC)    a.) TTE 12/03/2013: mod dec LV function; EF 35-40%; severe inferolateral and inferior HK; LA dilated; G3DD; PASP 52 mmHg. b.) TTE 07/18/2015: mild LV dysfunction with mild LVH; EF 45%; mild BAE. c.) TTE 01/10/2018: mod LV dysfunction; EF 45%; diffuse HK   HLD (hyperlipidemia)    Hypertension    IDA (iron deficiency anemia)    Long term current use of antithrombotics/antiplatelets    a.) clopidogrel   Myocardial infarction (HCC) 2000   Peripheral edema    Pneumonia    S/P CABG x 4 2001   a.) LIMA-LAD, SVG-D1, SVG-OM1, SVG-OM2, SVG-PDA   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   left frontal scalp, EDC   Squamous cell  carcinoma of skin 05/05/2020   left temporal scalp, in situ, EDC 05/13/20   T2DM (type 2 diabetes mellitus) (HCC)    Valvular insufficiency 12/03/2013   a.) TTE 12/03/2013: EF 35-40%; mild AR/MR, b.) TTE 07/18/2015: EF 45%; mild AR/PR, mod TR, severe MR. c.) TTE 01/10/2018: mod AR/TR    Past Surgical History:  Procedure Laterality Date   A/V SHUNTOGRAM Left 04/01/2022   Procedure: A/V SHUNTOGRAM;  Surgeon: Annice Needy, MD;  Location: [facility] INVASIVE CV  LAB;  Service: Cardiovascular;  Laterality: Left;   AV FISTULA PLACEMENT Left 04/15/2021   Procedure: INSERTION OF ARTERIOVENOUS (AV) GORE-TEX GRAFT ARM ( BRACHIAL AXILLARY);  Surgeon: Annice Needy, MD;  Location: [facility] ORS;  Service: Vascular;  Laterality: Left;   CATARACT EXTRACTION W/PHACO Left 07/26/2017   Procedure: CATARACT EXTRACTION PHACO AND INTRAOCULAR LENS PLACEMENT (IOC);  Surgeon: Galen Manila, MD;  Location: [facility] ORS;  Service: Ophthalmology;  Laterality: Left;  [place]   00:45.8 AP%  13.1 CDE  6.00 Fluid Pack Lot # [id number]   CATARACT EXTRACTION W/PHACO Right 08/17/2017   Procedure: CATARACT EXTRACTION PHACO AND INTRAOCULAR LENS PLACEMENT (IOC);  Surgeon: Galen Manila, MD;  Location: [facility] ORS;  Service: Ophthalmology;  Laterality: Right;  [place]  01:30 AP% 16.8 CDE 15.24 Fluid pack lot # [id number]   CHOLECYSTECTOMY     CORONARY ARTERY BYPASS GRAFT N/A 2001   Procedure: 5v CABG (LIMA-LAD, SVG-D1, SVG-OM1, SVG-OM2, SVG-PDA)   DIALYSIS/PERMA CATHETER INSERTION N/A 02/09/2018   Procedure: DIALYSIS/PERMA CATHETER INSERTION;  Surgeon: Annice Needy, MD;  Location: [facility] INVASIVE CV LAB;  Service: Cardiovascular;  Laterality: N/A;   DIALYSIS/PERMA CATHETER REMOVAL N/A 06/25/2021   Procedure: DIALYSIS/PERMA CATHETER REMOVAL;  Surgeon: Annice Needy, MD;  Location: [facility] INVASIVE CV LAB;  Service: Cardiovascular;  Laterality: N/A;   FRACTURE SURGERY Left 2015   LEFT arm   INSERTION OF DIALYSIS CATHETER Right 11/03/2020   Procedure: INSERTION OF Perm Cath in the RIGHT INTERNAL JUGULAR;  Surgeon: Annice Needy, MD;  Location: [facility] ORS;  Service: Vascular;  Laterality: Right;   INTRAMEDULLARY (IM) NAIL INTERTROCHANTERIC Right 11/12/2022   Procedure: INTRAMEDULLARY (IM) NAIL INTERTROCHANTERIC;  Surgeon: Juanell Fairly, MD;  Location: [facility] ORS;  Service: Orthopedics;  Laterality: Right;   LEFT HEART CATHETERIZATION WITH CORONARY/GRAFT ANGIOGRAM Left 12/04/2013   Procedure: LEFT HEART  CATHETERIZATION WITH Isabel Caprice;  Surgeon: Marykay Lex, MD;  Location[facility] CATH LAB;  Service: Cardiovascular;  Laterality: Left;   LEFT HEART CATHETERIZATION WITH CORONARY/GRAFT ANGIOGRAM Left 02/06/2009   Procedure: LEFT HEART CATHETERIZATION WITH CORONARY/GRAFT ANGIOGRAM; Location: [facility]; Surgeon: Despina Hick, MD   PERCUTANEOUS CORONARY STENT INTERVENTION (PCI-S) N/A 12/06/2013   Procedure: STAGED PERCUTANEOUS CORONARY STENT INTERVENTION (overlapping 4.0 x 38 mm (mid) and 4.0 x 28 mm (ostial) Promus Primier DES to SVG-RPDA graft);  Surgeon: Lesleigh Noe, MD;  Location: [name] CATH LAB;  Service: Cardiovascular   REMOVAL OF A DIALYSIS CATHETER N/A 11/03/2020   Procedure: REMOVAL OF A DIALYSIS CATHETER;  Surgeon: Annice Needy, MD;  Location: [facility] ORS;  Service: Vascular;  Laterality: N/A;     reports that he quit smoking about 32 years ago. His smoking use included pipe and cigarettes. He has never used smokeless tobacco. He reports that he does not currently use alcohol after a past usage of about 1.0 standard drink of alcohol per week. He reports that he does not use drugs.  Allergies  Allergen Reactions   Other     Other reaction(s): Myalgias (  intolerance), Other (See Comments) Statin Drugs  Statin Drugs     Amoxicillin Diarrhea    Family History  Problem Relation Age of Onset   Heart attack Father 35       died first MI at age 22   Heart failure Mother      Prior to Admission medications   Medication Sig Start Date End Date Taking? Authorizing Provider  acyclovir ointment (ZOVIRAX) 5 % Apply 1 application topically daily as needed (shingles). Patient not taking: Reported on 11/12/2022    [provider]  bisacodyl (DULCOLAX) 10 MG suppository Place 1 suppository (10 mg total) rectally daily as needed for moderate constipation. Patient not taking: Reported on 11/12/2022 01/05/21   Enedina Finner, MD  cetirizine (ZYRTEC) 10 MG tablet Take 10 mg by mouth  daily.    [provider]  cholecalciferol (VITAMIN D) 25 MCG (1000 UNIT) tablet Take 1,000 Units by mouth daily with breakfast.    [provider]  clopidogrel (PLAVIX) 75 MG tablet Take 1 tablet (75 mg total) by mouth daily. 12/02/22   Laurier Nancy, MD  ferrous sulfate 325 (65 FE) MG tablet Take 325 mg by mouth 2 (two) times daily with a meal.     [provider]  fluticasone (FLONASE) 50 MCG/ACT nasal spray Place 2 sprays into both nostrils daily as needed for allergies or rhinitis.    [provider]  furosemide (LASIX) 40 MG tablet Take 40 mg by mouth.    [provider]  levothyroxine (SYNTHROID) 100 MCG tablet TAKE 1 TABLET BY MOUTH IN THE  MORNING 12/31/22   Sherron Monday, MD  lidocaine-prilocaine (EMLA) cream Apply 1 Application topically as needed (before dialysis).    [provider]  midodrine (PROAMATINE) 5 MG tablet Take 1 tablet (5 mg total) by mouth 3 (three) times daily with meals. Patient taking differently: Take 5 mg by mouth 3 (three) times daily as needed. 02/17/21   Delfino Lovett, MD  multivitamin (RENA-VIT) TABS tablet Take 1 tablet by mouth daily.    [provider]  nitroGLYCERIN (NITROSTAT) 0.4 MG SL tablet Place 0.4 mg under the tongue every 5 (five) minutes as needed for chest pain.    [provider]  Omega-3 Fatty Acids (FISH OIL) 1000 MG CAPS Take 4,000 mg by mouth daily with lunch.    [provider]  polyethylene glycol (MIRALAX / GLYCOLAX) 17 g packet Take 17 g by mouth daily. 01/06/21   Enedina Finner, MD  potassium chloride SA (KLOR-CON) 20 MEQ tablet Take 40 mEq by mouth daily.    [provider]  rosuvastatin (CRESTOR) 40 MG tablet TAKE 1 TABLET BY MOUTH ONCE  DAILY 10/28/22   Adrian Blackwater A, MD  senna-docusate (SENOKOT-S) 8.6-50 MG tablet Take 2 tablets by mouth 2 (two) times daily. 01/05/21   Enedina Finner, MD  sotalol (BETAPACE) 80 MG tablet Take 1 tablet (80 mg total) by  mouth 2 (two) times daily. 10/14/22   Laurier Nancy, MD    Physical Exam: Vitals:   01/08/23 1400 01/08/23 1401 01/08/23 1500 01/08/23 1515  BP: (!) 98/45     Pulse: (!) 47  92 86  Resp:      Temp:      TempSrc:      SpO2: 97%  96% 98%  Weight:  91.2 kg    Height:  6\' 1"  (1.854 m)      Constitutional: NAD, calm, comfortable Vitals:   01/08/23 1400 01/08/23  1401 01/08/23 1500 01/08/23 1515  BP: (!) 98/45     Pulse: (!) 47  92 86  Resp:      Temp:      TempSrc:      SpO2: 97%  96% 98%  Weight:  91.2 kg    Height:  6\' 1"  (1.854 m)     Eyes: PERRL, lids and conjunctivae normal ENMT: Mucous membranes are moist. Posterior pharynx clear of any exudate or lesions.Normal dentition.  Neck: normal, supple, no masses, no thyromegaly Respiratory: clear to auscultation bilaterally, no wheezing, no crackles. Normal respiratory effort. No accessory muscle use.  Cardiovascular: Regular rate and rhythm, no murmurs / rubs / gallops. No extremity edema. 2+ pedal pulses. No carotid bruits.  Abdomen: no tenderness, no masses palpated. No hepatosplenomegaly. Bowel sounds positive.  Musculoskeletal: Severe tenderness on right hip and shortening of right leg and significant decreased ROM Skin: no rashes, lesions, ulcers. No induration Neurologic: CN 2-12 grossly intact. Sensation intact, DTR normal. Strength 5/5 in all 4.  Psychiatric: Normal judgment and insight. Alert and oriented x 3. Normal mood.     Labs on Admission: I have personally reviewed following labs and imaging studies  CBC: Recent Labs  Lab 01/08/23 1358  WBC 6.0  NEUTROABS 4.2  HGB 11.2*  HCT 35.5*  MCV 99.7  PLT 131*   Basic Metabolic Panel: Recent Labs  Lab 01/08/23 1358  NA 134*  K 4.4  CL 94*  CO2 28  GLUCOSE 154*  BUN 65*  CREATININE 2.93*  CALCIUM 8.7*   GFR: Estimated Creatinine Clearance: 18.9 mL/min (A) (by C-G formula based on SCr of 2.93 mg/dL (H)). Liver Function Tests: No results for  input(s): "AST", "ALT", "ALKPHOS", "BILITOT", "PROT", "ALBUMIN" in the last 168 hours. No results for input(s): "LIPASE", "AMYLASE" in the last 168 hours. No results for input(s): "AMMONIA" in the last 168 hours. Coagulation Profile: No results for input(s): "INR", "PROTIME" in the last 168 hours. Cardiac Enzymes: No results for input(s): "CKTOTAL", "CKMB", "CKMBINDEX", "TROPONINI" in the last 168 hours. BNP (last 3 results) No results for input(s): "PROBNP" in the last 8760 hours. HbA1C: No results for input(s): "HGBA1C" in the last 72 hours. CBG: No results for input(s): "GLUCAP" in the last 168 hours. Lipid Profile: No results for input(s): "CHOL", "HDL", "LDLCALC", "TRIG", "CHOLHDL", "LDLDIRECT" in the last 72 hours. Thyroid Function Tests: No results for input(s): "TSH", "T4TOTAL", "FREET4", "T3FREE", "THYROIDAB" in the last 72 hours. Anemia Panel: No results for input(s): "VITAMINB12", "FOLATE", "FERRITIN", "TIBC", "IRON", "RETICCTPCT" in the last 72 hours. Urine analysis:    Component Value Date/Time   COLORURINE YELLOW (A) 05/24/2019 0021   APPEARANCEUR CLEAR (A) 05/24/2019 0021   LABSPEC 1.012 05/24/2019 0021   PHURINE 6.0 05/24/2019 0021   GLUCOSEU NEGATIVE 05/24/2019 0021   HGBUR NEGATIVE 05/24/2019 0021   BILIRUBINUR Negative 10/15/2022 1202   KETONESUR NEGATIVE 05/24/2019 0021   PROTEINUR Positive (A) 10/15/2022 1202   PROTEINUR 30 (A) 05/24/2019 0021   UROBILINOGEN 0.2 10/15/2022 1202   NITRITE Negative 10/15/2022 1202   NITRITE NEGATIVE 05/24/2019 0021   LEUKOCYTESUR Large (3+) (A) 10/15/2022 1202   LEUKOCYTESUR NEGATIVE 05/24/2019 0021    Radiological Exams on Admission: DG Femur Portable Min 2 Views Right  Result Date: 01/08/2023 CLINICAL DATA:  Right hip surgery in June with worsening pain in the right hip over the past week. EXAM: RIGHT FEMUR PORTABLE 2 VIEW COMPARISON:  Right hip radiographs dated 11/12/2022. FINDINGS: The patient is status post placement  of a femoral shaft intramedullary nail and femoral neck screw on the right. Compared to 11/12/2022, the femoral neck screw has advanced with the tip now extending slightly beyond the right femoral head and into the right hip joint space. No new fracture is identified, however there is suggestion of compression of the intertrochanteric fracture of the right femur compared to prior exam. A displaced fracture fragment of the right lesser trochanter is redemonstrated. IMPRESSION: Suggestion of compression of the intertrochanteric fracture of the right femur compared to prior exam as well as interval advancement of the femoral neck screw with the tip now extending slightly beyond the right femoral head and into the right hip joint space. Electronically Signed   By: Romona Curls M.D.   On: 01/08/2023 15:01   DG Hip Unilat W or Wo Pelvis 2-3 Views Right  Result Date: 01/08/2023 CLINICAL DATA:  Right hip surgery in June with worsening pain in the right hip over the past week. EXAM: DG HIP (WITH OR WITHOUT PELVIS) 2-3V RIGHT COMPARISON:  Right hip radiographs dated 11/12/2022. FINDINGS: The patient is status post placement of a femoral shaft intramedullary nail and femoral neck screw on the right. Compared to 11/12/2022, the femoral neck screw has advanced with the tip now extending slightly beyond the right femoral head and into the right hip joint space. No new fracture is identified, however there is suggestion of compression of the intertrochanteric fracture of the right femur compared to prior exam. A displaced fracture fragment of the right lesser trochanter is redemonstrated. IMPRESSION: Suggestion of compression of the intertrochanteric fracture of the right femur compared to prior exam as well as interval advancement of the femoral neck screw with the tip now extending slightly beyond the right femoral head and into the right hip joint space. Electronically Signed   By: Romona Curls M.D.   On: 01/08/2023 15:00     EKG: Independently reviewed.  Sinus rhythm, borderline bradycardia, frequent PVCs  Assessment/Plan Principal Problem:   Hip fracture (HCC) Active Problems:   Closed hip fracture requiring operative repair, right, sequela   Atrial fibrillation (HCC)   CAD (coronary artery disease)   Age-related osteoporosis with current pathological fracture with routine healing  (please populate well all problems here in Problem List. (For example, if patient is on BP meds at home and you resume or decide to hold them, it is a problem that needs to be her. Same for CAD, COPD, HLD and so on)  Defective right hip hardware -Significant compression of the previous intertrochanteric fracture with femoral neck screw extending into the right hip inter joint space -Orthopedic surgery aware, who will discuss the case with patient's own orthopedic surgeon Dr. Lesleigh Noe to decide revision plan.  Family aware. -Admit to MedSurg for pain control.  IV Dilaudid for severe pain -Appears that at least part of the problem is severe osteoporosis, will check vitamin D level and recommend patient continue to follow-up for possible outpatient Prolia treatment  ESRD on HD -Euvolemic, no significant electrolyte imbalance, next HD on Monday  Chronic orthostatic hypotension -Blood pressure at baseline, asymptomatic  Chronic HFrEF -Euvolemic, no acute concerns  CAD status post CABG -Continue Plavix and statin  PAF -In sinus rhythm, continue sotalol -Not on systemic anticoagulation due to frequent falls    DVT prophylaxis: Heparin subcu Code Status: DNR Family Communication: Daughter at bedside Disposition Plan: Patient is sick with defected right hip hardware requiring revision, expect more than 2 midnight hospital stay Consults called: Orthopedic surgery  Admission status: MedSurg admission   Emeline General MD [facility] Pager [phone] 01/08/2023, 4:48 PM

## 2023-01-08 NOTE — ED Provider Notes (Signed)
[facility] Provider Note    Event Date/Time   First MD Initiated Contact with Patient 01/08/23 1348     (approximate)   History   Hip Pain   HPI Matthew [name] is a 89 y.o. male with ESRD on dialysis, HTN, HLD, HFrEF, DM2, A-fib who presents today for right hip pain.  Patient had right intertrochanteric hip fracture on 11/12/2022 with IM nail placed.  He was in rehab for several weeks afterwards and was able to ambulate and climb stairs.  Patient notes being discharged from rehab 1 week ago.  Over the past 3 to 4 days he has had worsening hip pain and has not been able to ambulate on it in the past 3 days when he was able to 1 week ago.  He denies fevers or chills.  He denies any recent falls.  He denies pain anywhere else.     Physical Exam   Triage Vital Signs: ED Triage Vitals  Encounter Vitals Group     BP      Systolic BP Percentile      Diastolic BP Percentile      Pulse      Resp      Temp      Temp src      SpO2      Weight      Height      Head Circumference      Peak Flow      Pain Score      Pain Loc      Pain Education      Exclude from Growth Chart     Most recent vital signs: Vitals:   01/08/23 1515 01/08/23 1650  BP:  (!) 110/51  Pulse: 86 90  Resp:  18  Temp:  98 F (36.7 C)  SpO2: 98% 94%    Physical Exam: I have reviewed the vital signs and nursing notes. General: Awake, alert, no acute distress.  Nontoxic appearing. Head:  Atraumatic, normocephalic.   ENT:  EOM intact, PERRL. Oral mucosa is pink and moist with no lesions. Neck: Neck is supple with full range of motion, No meningeal signs. Cardiovascular:  RRR, No murmurs. Peripheral pulses palpable and equal bilaterally. Respiratory:  Symmetrical chest wall expansion.  No rhonchi, rales, or wheezes.  Good air movement throughout.  No use of accessory muscles.   Musculoskeletal:  No cyanosis or edema.  Positive logroll test to right lower extremity.  No  tenderness palpation to right hip.  Well-healed surgical scar without obvious evidence of protruding foreign bodies, erythema, or drainage.  Neurovascular intact throughout bilateral lower extremities. Abdomen:  Soft, nontender, nondistended. Neuro:  GCS 15, moving all four extremities, interacting appropriately. Speech clear. Psych:  Calm, appropriate.   Skin:  Warm, dry, no rash.     ED Results / Procedures / Treatments   Labs (all labs ordered are listed, but only abnormal results are displayed) Labs Reviewed  CBC WITH DIFFERENTIAL/PLATELET - Abnormal; Notable for the following components:      Result Value   RBC 3.56 (*)    Hemoglobin 11.2 (*)    HCT 35.5 (*)    RDW 15.9 (*)    Platelets 131 (*)    Lymphs Abs 0.6 (*)    All other components within normal limits  BASIC METABOLIC PANEL - Abnormal; Notable for the following components:   Sodium 134 (*)    Chloride 94 (*)    Glucose, Bld 154 (*)  BUN 65 (*)    Creatinine, Ser 2.93 (*)    Calcium 8.7 (*)    GFR, Estimated 20 (*)    All other components within normal limits  VITAMIN D 25 HYDROXY (VIT D DEFICIENCY, FRACTURES)     EKG My EKG interpretation 1655: Rate of 60, normal sinus rhythm with frequent PVCs.  Left axis deviation.  No acute ST elevations or depressions.   RADIOLOGY X-rays right hip per my interpretation show concern for hardware loosening but no obvious new fracture.  See separate radiology read.   PROCEDURES:  Critical Care performed: No  Procedures   MEDICATIONS ORDERED IN ED: Medications  rosuvastatin (CRESTOR) tablet 40 mg (has no administration in time range)  sotalol (BETAPACE) tablet 80 mg (has no administration in time range)  levothyroxine (SYNTHROID) tablet 100 mcg (has no administration in time range)  polyethylene glycol (MIRALAX / GLYCOLAX) packet 17 g (has no administration in time range)  senna-docusate (Senokot-S) tablet 2 tablet (has no administration in time range)   clopidogrel (PLAVIX) tablet 75 mg (has no administration in time range)  cholecalciferol (VITAMIN D3) 25 MCG (1000 UNIT) tablet 1,000 Units (has no administration in time range)  loratadine (CLARITIN) tablet 10 mg (has no administration in time range)  fluticasone (FLONASE) 50 MCG/ACT nasal spray 2 spray (has no administration in time range)  HYDROcodone-acetaminophen (NORCO/VICODIN) 5-325 MG per tablet 1-2 tablet (has no administration in time range)  heparin injection 5,000 Units (has no administration in time range)  HYDROmorphone (DILAUDID) injection 0.5 mg (has no administration in time range)  senna-docusate (Senokot-S) tablet 1 tablet (has no administration in time range)  bisacodyl (DULCOLAX) EC tablet 5 mg (has no administration in time range)     IMPRESSION / MDM / ASSESSMENT AND PLAN / ED COURSE  I reviewed the triage vital signs and the nursing notes.                              Differential diagnosis includes, but is not limited to, right hip fracture, hardware loosening, hardware fracture, musculoskeletal injury, septic hip.  Patient's presentation is most consistent with acute presentation with potential threat to life or bodily function.  Patient is a 84-year-old male presenting today with right hip pain in the setting of recent right IM nail placement for hip fracture.  X-rays show concern for loosening of hardware as well as migration of the screw into the joint space.  Patient is nonweightbearing now that extremity.  Discussed the case with orthopedics, Dr. Hyacinth Meeker, who states patient may likely need revision surgery of the screws.  He is discussing the case with patient's primary orthopedic Dr. Martha Clan.  Since patient is not ambulatory with multiple chronic comorbidities, family is no longer able to take care of him.  He is unsafe to go home at this time and needs admission to the hospital while orthopedics continues to discuss his current plan as well as likely placement  into a facility afterwards.  Patient was admitted to hospitalist service for further care.  The patient is on the cardiac monitor to evaluate for evidence of arrhythmia and/or significant heart rate changes. Clinical Course as of 01/08/23 1709  Sat Jan 08, 2023  1414 CBC with Differential(!) Mild anemia and thrombocytopenia overall comparable or improved from his baseline.  No other concerning findings. [DW]  1450 Basic metabolic panel(!) Largely consistent with patient's known ESRD on dialysis [DW]  1521 Orthopedics - pain  control, placement issue, ortho. They will see him in the hospital tomorrow. Suspect he will need to be transferred to Dr. Samuel Germany new surgical suite for repair [DW]    Clinical Course User Index [DW] Matthew Crete Donetta Potts, MD     FINAL CLINICAL IMPRESSION(S) / ED DIAGNOSES   Final diagnoses:  Postoperative surgical complication involving musculoskeletal system associated with musculoskeletal procedure, unspecified complication  Right hip pain     Rx / DC Orders   ED Discharge Orders     None        Note:  This document was prepared using Dragon voice recognition software and may include unintentional dictation errors.   Janith Lima, MD 01/08/23 [phone]

## 2023-01-08 NOTE — ED Triage Notes (Addendum)
Per EMT report, Patient had a right hip replacement on 6/23. Patient was in rehab until one week ago and now is at home.. Patient was d/c'd from rehab with 100% mobility, which has gradually decreased until now is not weight-bearing due to pain. X-rays taken showed movement in the screws. PT has been to home.  CBG 174 96% on room air 115/53 70 pulse

## 2023-01-08 NOTE — ED Notes (Signed)
Patient was given [place] ED sandwich tray and a drink by PA-C.

## 2023-01-09 DIAGNOSIS — S72001P Fracture of unspecified part of neck of right femur, subsequent encounter for closed fracture with malunion: Secondary | ICD-10-CM | POA: Diagnosis not present

## 2023-01-09 LAB — GLUCOSE, CAPILLARY
Glucose-Capillary: 91 mg/dL (ref 70–99)
Glucose-Capillary: 146 mg/dL — ABNORMAL HIGH (ref 70–99)
Glucose-Capillary: 144 mg/dL — ABNORMAL HIGH (ref 70–99)
Glucose-Capillary: 131 mg/dL — ABNORMAL HIGH (ref 70–99)

## 2023-01-09 LAB — VITAMIN D 25 HYDROXY (VIT D DEFICIENCY, FRACTURES): Vit D, 25-Hydroxy: 40.64 ng/mL (ref 30–100)

## 2023-01-09 MED ORDER — FUROSEMIDE 40 MG PO TABS
40.0000 mg | ORAL_TABLET | Freq: Every day | ORAL | Status: DC
  Administered 2023-01-09: 40 mg via ORAL
  Filled 2023-01-09: qty 1

## 2023-01-09 MED ORDER — SUVOREXANT 10 MG PO TABS: 10.0000 mg | ORAL_TABLET | Freq: Every day | ORAL | Status: DC

## 2023-01-09 MED ORDER — SODIUM CHLORIDE 0.9 % IV SOLN: INTRAVENOUS | Status: DC

## 2023-01-09 MED ORDER — ROSUVASTATIN CALCIUM 10 MG PO TABS
40.0000 mg | ORAL_TABLET | Freq: Every day | ORAL | Status: DC
  Filled 2023-01-09: qty 4

## 2023-01-09 MED ORDER — MIDODRINE HCL 5 MG PO TABS
10.0000 mg | ORAL_TABLET | ORAL | Status: DC
  Administered 2023-01-10: 10 mg via ORAL

## 2023-01-09 MED ORDER — MIDODRINE HCL 5 MG PO TABS
5.0000 mg | ORAL_TABLET | ORAL | Status: DC | PRN
  Administered 2023-01-10: 5 mg via ORAL
  Filled 2023-01-09: qty 1

## 2023-01-09 MED ORDER — ZOLPIDEM TARTRATE 5 MG PO TABS: 5.0000 mg | ORAL_TABLET | Freq: Every evening | ORAL | Status: DC | PRN

## 2023-01-09 MED ORDER — RENA-VITE PO TABS
1.0000 | ORAL_TABLET | Freq: Every day | ORAL | Status: DC
  Administered 2023-01-09: 1 via ORAL
  Filled 2023-01-09 (×2): qty 1

## 2023-01-09 MED ORDER — LEVOTHYROXINE SODIUM 88 MCG PO TABS
88.0000 ug | ORAL_TABLET | Freq: Every day | ORAL | Status: DC
  Administered 2023-01-10: 88 ug via ORAL
  Filled 2023-01-09: qty 1

## 2023-01-09 MED ORDER — ENSURE ENLIVE PO LIQD
237.0000 mL | Freq: Two times a day (BID) | ORAL | Status: DC
  Administered 2023-01-09: 237 mL via ORAL

## 2023-01-09 NOTE — Progress Notes (Signed)
Subjective:    Dr. Myrene Galas, the orthopedic trauma surgeon in [place] has agreed to take Matthew [name] case at [facility].  He will call tomorrow to let us know when to transfer him to [facility].  There should be a hospitalist to hospitalist transfer he thinks.  We are to keep him n.p.o. in case surgery is done tomorrow.  It may not be until the next day. He has also requested a CT scan of the hip which I have ordered for tomorrow morning hopefully.  Patient reports pain as moderate.  Objective:   VITALS:   Vitals:   01/09/23 0929 01/09/23 1652  BP: 119/74 121/65  Pulse: 60 67  Resp: 18 16  Temp: 97.7 F (36.5 C) 97.6 F (36.4 C)  SpO2: 96% 95%    Unchanged  LABS Recent Labs    01/08/23 1358  HGB 11.2*  HCT 35.5*  WBC 6.0  PLT 131*    Recent Labs    01/08/23 1358  NA 134*  K 4.4  BUN 65*  CREATININE 2.93*  GLUCOSE 154*    No results for input(s): "LABPT", "INR" in the last 72 hours.   Assessment/Plan:    Transfer to [facility] tomorrow to the care of Dr. Myrene Galas for revision surgery on his hip.

## 2023-01-09 NOTE — NC FL2 (Signed)
[place] MEDICAID FL2 LEVEL OF CARE FORM     IDENTIFICATION  Patient Name: Matthew [name] Birthdate: 02/02/1931 Sex: male Admission Date (Current Location): 01/08/2023  [facility] and [place] Number:  Chiropodist and Address:         Provider Number: [id number]  Attending Physician Name and Address:  Kathrynn Running, MD  Relative Name and Phone Number:  Yehuda Savannah (Daughter)  [phone] (Mobile)    Current Level of Care: Hospital Recommended Level of Care: Skilled Nursing Facility Prior Approval Number:    Date Approved/Denied:   PASRR Number: [id number]  Discharge Plan:      Current Diagnoses: Patient Active Problem List   Diagnosis Date Noted   Hip fracture (HCC) 01/08/2023   Drop in hemoglobin 11/16/2022   Hyperkalemia 11/15/2022   Closed hip fracture requiring operative repair, right, sequela 11/13/2022   HFrEF (heart failure with reduced ejection fraction) (HCC) 11/12/2022   Transaminitis 11/12/2022   Age-related osteoporosis with current pathological fracture with routine healing 02/18/2021   Pressure injury of skin 02/15/2021   Pelvic fracture (HCC) 02/11/2021   End-stage renal disease on hemodialysis (HCC)    Chronic hypotension 12/26/2020   Tremor    Hypothyroidism    Chronic systolic CHF (congestive heart failure) (HCC)    Proximal humerus fracture 12/25/2020   Malposition of peritoneal dialysis catheter (HCC) 11/01/2020   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   Secondary hyperparathyroidism (HCC) 04/30/2018   Sepsis (HCC) 02/15/2018   Cardiomyopathy (HCC) 01/24/2018   Fluid overload 01/10/2018   Climacteric arthritis, lower leg 12/15/2017   Mitral regurgitation 12/15/2017   Hypoglycemia 12/09/2017   Osteoarthritis of knee 06/23/2016   Undiagnosed cardiac murmurs 09/04/2015   Aneurysm of thoracic aorta (HCC) 08/04/2015   Congestive heart failure (HCC) 08/04/2015   Postsurgical aortocoronary bypass status 08/04/2015   Chronic  kidney disease 07/18/2015   Aortic root dilatation (HCC) 07/18/2015   Dissecting aortic aneurysm, 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/2015   Edema of both legs 07/18/2015   Shortness of breath 07/18/2015   Atrial fibrillation (HCC) 01/10/2014   Abnormal electrocardiogram 01/10/2014   Encounter for counseling 01/10/2014   Other general symptoms 01/10/2014   CAD (coronary artery disease) 12/04/2013   Unstable angina; Class III Angina 12/03/2013   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) 12/03/2013   HLD (hyperlipidemia) 12/03/2013   Heart disease 12/03/2013   Disorder of kidney and ureter 05/04/2013   Gastrointestinal hemorrhage 05/02/2012   Allergic rhinitis due to pollen 01/06/2007   Reflux esophagitis 01/06/2007   Overweight 10/06/2006    Orientation RESPIRATION BLADDER Height & Weight     Self, Time, Situation, Place  Normal Continent Weight: 201 lb (91.2 kg) Height:  6\' 1"  (185.4 cm)  BEHAVIORAL SYMPTOMS/MOOD NEUROLOGICAL BOWEL NUTRITION STATUS        Diet (Diet renal/carb modified with fluid restriction Diet-HS Snack? Nothing; Fluid restriction: 1800 mL Fluid; Room service appropriate? Yes; Fluid consistency: Thin)  AMBULATORY STATUS COMMUNICATION OF NEEDS Skin   Extensive Assist Verbally Skin abrasions, Bruising                       Personal Care Assistance Level of Assistance  Bathing, Feeding, Dressing Bathing Assistance: Maximum assistance Feeding assistance: Limited assistance Dressing Assistance: Maximum assistance     Functional Limitations Info             SPECIAL CARE FACTORS FREQUENCY  PT (By licensed PT), OT (By licensed OT)  PT Frequency: 5 times per week OT Frequency: 5 times per week            Contractures      Additional Factors Info  Code Status, Allergies Code Status Info: DNR Allergies Info: Other, Amoxicillin           Current Medications (01/09/2023):  This is the current hospital active medication list Current  Facility-Administered Medications  Medication Dose Route Frequency Provider Last Rate Last Admin   bisacodyl (DULCOLAX) EC tablet 5 mg  5 mg Oral Daily PRN Mikey College T, MD       cholecalciferol (VITAMIN D3) 25 MCG (1000 UNIT) tablet 1,000 Units  1,000 Units Oral Q breakfast Mikey College T, MD   1,000 Units at 01/09/23 0926   clopidogrel (PLAVIX) tablet 75 mg  75 mg Oral Daily Mikey College T, MD   75 mg at 01/09/23 0926   feeding supplement (ENSURE ENLIVE / ENSURE PLUS) liquid 237 mL  237 mL Oral BID BM Wouk, Wilfred Curtis, MD   237 mL at 01/09/23 1008   fluticasone (FLONASE) 50 MCG/ACT nasal spray 2 spray  2 spray Each Nare Daily PRN Mikey College T, MD       furosemide (LASIX) tablet 40 mg  40 mg Oral Daily Kathrynn Running, MD   40 mg at 01/09/23 1145   heparin injection 5,000 Units  5,000 Units Subcutaneous Q12H Mikey College T, MD   5,000 Units at 01/09/23 0927   HYDROcodone-acetaminophen (NORCO/VICODIN) 5-325 MG per tablet 1-2 tablet  1-2 tablet Oral Q6H PRN Mikey College T, MD   1 tablet at 01/09/23 1407   HYDROmorphone (DILAUDID) injection 0.5 mg  0.5 mg Intravenous Q4H PRN Mikey College T, MD   0.5 mg at 01/08/23 1823   [START ON 01/10/2023] levothyroxine (SYNTHROID) tablet 88 mcg  88 mcg Oral QAC breakfast Wouk, Wilfred Curtis, MD       loratadine (CLARITIN) tablet 10 mg  10 mg Oral Daily Mikey College T, MD   10 mg at 01/09/23 0926   [START ON 01/10/2023] midodrine (PROAMATINE) tablet 10 mg  10 mg Oral Q M,W,F-HD Lateef, Munsoor, MD       midodrine (PROAMATINE) tablet 5 mg  5 mg Oral Q dialysis Wouk, Wilfred Curtis, MD       multivitamin (RENA-VIT) tablet 1 tablet  1 tablet Oral QHS Wouk, Wilfred Curtis, MD       polyethylene glycol (MIRALAX / GLYCOLAX) packet 17 g  17 g Oral Daily Mikey College T, MD   17 g at 01/09/23 1308   senna-docusate (Senokot-S) tablet 2 tablet  2 tablet Oral BID Emeline General, MD   2 tablet at 01/09/23 6578   sotalol (BETAPACE) tablet 80 mg  80 mg Oral BID Mikey College T, MD   80 mg  at 01/09/23 0926   zolpidem (AMBIEN) tablet 5 mg  5 mg Oral QHS PRN Wouk, Wilfred Curtis, MD         Discharge Medications: Please see discharge summary for a list of discharge medications.  Relevant Imaging Results:  Relevant Lab Results:   Additional Information SS #: [id number]. [facility] MWF 11:15  Marchetta Navratil E Lavoris Sparling, LCSW

## 2023-01-09 NOTE — Progress Notes (Addendum)
PROGRESS NOTE    Matthew [name]  ZOX:[id number] DOB: [birth year] DOA: 01/08/2023 PCP: Sherron Monday, MD  Outpatient Specialists:    Brief Narrative:   Matthew [name] is a 89 y.o. male with medical history significant of osteoporosis, recent right hip fracture status post intramedullary rod and femoral neck screw, ESRD on HD MWF, chronic orthostatic hypotension on as needed midodrine, PAF not on anticoagulation due to fall risk, IIDM, presented with worsening right hip pain.   Patient recently underwent right hip surgery with intramedullary rod and neck screw after a fall and fracture of right hip and went to local SNF for rehab.  SNF stay was prolonged due to COVID infection was treated with Paxlovid.  Patient was discharged home from SNF 2 weeks ago but's ambulation significant deteriorated compared to when he was in the nursing home.  No significant fall.  This week patient has had significant difficulty to walk up stairs and PT sent him to see orthopedic surgery on Tuesday.  X-ray showed concerning signs for loosening of the screws and orthopedic surgeon recommended patient only does 30% weightbearing on right leg with the aid of a rolling walker.  Patient followed the instruction however the pain became unbearable and even standing up caused significant pain for the last couple of days.  Patient is ESRD on HD and went regular dialysis yesterday.   Assessment & Plan:   Principal Problem:   Hip fracture (HCC) Active Problems:   Closed hip fracture requiring operative repair, right, sequela   End-stage renal disease on hemodialysis (HCC)   Atrial fibrillation (HCC)   CAD (coronary artery disease)   HFrEF (heart failure with reduced ejection fraction) (HCC)   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)   Age-related osteoporosis with current pathological fracture with routine healing  # Recent hip fracture complicated by femoral nail migration Ortho is in the process of  determining the next course of action - pain control - bowel regimen - NWB RLE - PT/OT consults  # ESRD on hemodialysis Mwf hemodialysis - nephro consulted  # CAD # Hx CABG Asymptomatic. Reports intolerance to statins - continue plavix  # HFmrEF No dyspnea - cont home lasix  # T2DM Glucose appropriate - holding home glipizide - monitor fasting sugars for now  # Hypothyroid - cont home levothyroxine  # Insomnia - home suvorexant  # A-fib Not anticoagulated - cont home sotalol   DVT prophylaxis: heparin Code Status: dnr Family Communication: daughter updated @ bedside 8/18  Level of care: Med-Surg Status is: Inpatient Remains inpatient appropriate because: severity of illness    Consultants:  ortho  Procedures: pending  Antimicrobials:  none    Subjective: Right hip pain  Objective: Vitals:   01/08/23 1650 01/08/23 1834 01/09/23 0011 01/09/23 0929  BP: (!) 110/51 (!) 99/49 (!) 100/57 119/74  Pulse: 90 77 (!) 55 60  Resp: 18 17 16 18   Temp: 98 F (36.7 C) 98.1 F (36.7 C) (!) 97.5 F (36.4 C) 97.7 F (36.5 C)  TempSrc: Oral     SpO2: 94% 97% 96% 96%  Weight:      Height:        Intake/Output Summary (Last 24 hours) at 01/09/2023 1003 Last data filed at 01/09/2023 0444 Gross per 24 hour  Intake 240 ml  Output 125 ml  Net 115 ml   Filed Weights   01/08/23 1401  Weight: 91.2 kg    Examination:  General exam: Appears calm  and comfortable  Respiratory system: Clear to auscultation. Respiratory effort normal. Cardiovascular system: S1 & S2 heard, RRR. No JVD, murmurs, rubs, gallops or clicks.   Gastrointestinal system: Abdomen is nondistended, soft and nontender.  Central nervous system: Alert and oriented. No focal neurological deficits. Extremities: warm Mild LE edema Skin: No rashes, lesions or ulcers Psychiatry: Judgement and insight appear normal. Mood & affect appropriate.     Data Reviewed: I have personally reviewed  following labs and imaging studies  CBC: Recent Labs  Lab 01/08/23 1358  WBC 6.0  NEUTROABS 4.2  HGB 11.2*  HCT 35.5*  MCV 99.7  PLT 131*   Basic Metabolic Panel: Recent Labs  Lab 01/08/23 1358  NA 134*  K 4.4  CL 94*  CO2 28  GLUCOSE 154*  BUN 65*  CREATININE 2.93*  CALCIUM 8.7*   GFR: Estimated Creatinine Clearance: 18.9 mL/min (A) (by C-G formula based on SCr of 2.93 mg/dL (H)). Liver Function Tests: No results for input(s): "AST", "ALT", "ALKPHOS", "BILITOT", "PROT", "ALBUMIN" in the last 168 hours. No results for input(s): "LIPASE", "AMYLASE" in the last 168 hours. No results for input(s): "AMMONIA" in the last 168 hours. Coagulation Profile: No results for input(s): "INR", "PROTIME" in the last 168 hours. Cardiac Enzymes: No results for input(s): "CKTOTAL", "CKMB", "CKMBINDEX", "TROPONINI" in the last 168 hours. BNP (last 3 results) No results for input(s): "PROBNP" in the last 8760 hours. HbA1C: No results for input(s): "HGBA1C" in the last 72 hours. CBG: Recent Labs  Lab 01/08/23 2106 01/09/23 0744  GLUCAP 78 91   Lipid Profile: No results for input(s): "CHOL", "HDL", "LDLCALC", "TRIG", "CHOLHDL", "LDLDIRECT" in the last 72 hours. Thyroid Function Tests: No results for input(s): "TSH", "T4TOTAL", "FREET4", "T3FREE", "THYROIDAB" in the last 72 hours. Anemia Panel: No results for input(s): "VITAMINB12", "FOLATE", "FERRITIN", "TIBC", "IRON", "RETICCTPCT" in the last 72 hours. Urine analysis:    Component Value Date/Time   COLORURINE YELLOW (A) 05/24/2019 0021   APPEARANCEUR CLEAR (A) 05/24/2019 0021   LABSPEC 1.012 05/24/2019 0021   PHURINE 6.0 05/24/2019 0021   GLUCOSEU NEGATIVE 05/24/2019 0021   HGBUR NEGATIVE 05/24/2019 0021   BILIRUBINUR Negative 10/15/2022 1202   KETONESUR NEGATIVE 05/24/2019 0021   PROTEINUR Positive (A) 10/15/2022 1202   PROTEINUR 30 (A) 05/24/2019 0021   UROBILINOGEN 0.2 10/15/2022 1202   NITRITE Negative 10/15/2022 1202    NITRITE NEGATIVE 05/24/2019 0021   LEUKOCYTESUR Large (3+) (A) 10/15/2022 1202   LEUKOCYTESUR NEGATIVE 05/24/2019 0021   Sepsis Labs: @LABRCNTIP (procalcitonin:4,lacticidven:4)  )No results found for this or any previous visit (from the past 240 hour(s)).       Radiology Studies: DG Femur Portable Min 2 Views Right  Result Date: 01/08/2023 CLINICAL DATA:  Right hip surgery in June with worsening pain in the right hip over the past week. EXAM: RIGHT FEMUR PORTABLE 2 VIEW COMPARISON:  Right hip radiographs dated 11/12/2022. FINDINGS: The patient is status post placement of a femoral shaft intramedullary nail and femoral neck screw on the right. Compared to 11/12/2022, the femoral neck screw has advanced with the tip now extending slightly beyond the right femoral head and into the right hip joint space. No new fracture is identified, however there is suggestion of compression of the intertrochanteric fracture of the right femur compared to prior exam. A displaced fracture fragment of the right lesser trochanter is redemonstrated. IMPRESSION: Suggestion of compression of the intertrochanteric fracture of the right femur compared to prior exam as well as interval advancement of  the femoral neck screw with the tip now extending slightly beyond the right femoral head and into the right hip joint space. Electronically Signed   By: Romona Curls M.D.   On: 01/08/2023 15:01   DG Hip Unilat W or Wo Pelvis 2-3 Views Right  Result Date: 01/08/2023 CLINICAL DATA:  Right hip surgery in June with worsening pain in the right hip over the past week. EXAM: DG HIP (WITH OR WITHOUT PELVIS) 2-3V RIGHT COMPARISON:  Right hip radiographs dated 11/12/2022. FINDINGS: The patient is status post placement of a femoral shaft intramedullary nail and femoral neck screw on the right. Compared to 11/12/2022, the femoral neck screw has advanced with the tip now extending slightly beyond the right femoral head and into the  right hip joint space. No new fracture is identified, however there is suggestion of compression of the intertrochanteric fracture of the right femur compared to prior exam. A displaced fracture fragment of the right lesser trochanter is redemonstrated. IMPRESSION: Suggestion of compression of the intertrochanteric fracture of the right femur compared to prior exam as well as interval advancement of the femoral neck screw with the tip now extending slightly beyond the right femoral head and into the right hip joint space. Electronically Signed   By: Romona Curls M.D.   On: 01/08/2023 15:00        Scheduled Meds:  cholecalciferol  1,000 Units Oral Q breakfast   clopidogrel  75 mg Oral Daily   feeding supplement  237 mL Oral BID BM   heparin  5,000 Units Subcutaneous Q12H   loratadine  10 mg Oral Daily   multivitamin  1 tablet Oral QHS   polyethylene glycol  17 g Oral Daily   senna-docusate  2 tablet Oral BID   sotalol  80 mg Oral BID   Continuous Infusions:   LOS: 1 day     Silvano Bilis, MD [facility]   If 7PM-7AM, please contact night-coverage [email or web address] Password [facility] 01/09/2023, 10:03 AM

## 2023-01-09 NOTE — Progress Notes (Signed)
Initial Nutrition Assessment  DOCUMENTATION CODES:   Not applicable  INTERVENTION:  - Liberalize to Regular diet.   - Add Ensure Enlive po BID, each supplement provides 350 kcal and 20 grams of protein.  - Add Rena-vit.   NUTRITION DIAGNOSIS:   Increased nutrient needs related to post-op healing (potential for surgery) as evidenced by estimated needs.  GOAL:   Patient will meet greater than or equal to 90% of their needs  MONITOR:   PO intake  REASON FOR ASSESSMENT:   Consult Assessment of nutrition requirement/status  ASSESSMENT:   89 y.o. male admits related to right hip pain. PMH includes: arthritis, ascending aortic aneurysm, afib, cardiac murmur, CAD, cardiomyopathy, GERD, ESRD, HFrEF, HLD, HTN, MI, T2DM. Pt is currently receiving medical management related to defective right hip hardware.  Meds reviewed: Vit D3, miralax, senokot. Labs reviewed: Na low, BUN/Creatinine elevated.   RD unable to reach patient via phone. No intakes documented, but RN notes that the pt is eating well. No significant wt loss per record. The pt with potential for further surgical intervention per Orthopedic MD. However, plan for now is for pain control and SNF placement. RD will liberalize to a Regular diet and add supplements. RD will continue to monitor PO intakes.   NUTRITION - FOCUSED PHYSICAL EXAM:  Remote assessment.  Diet Order:   Diet Order             Diet Heart Room service appropriate? Yes; Fluid consistency: Thin  Diet effective now                   EDUCATION NEEDS:   Not appropriate for education at this time  Skin:  Skin Assessment: Reviewed RN Assessment  Last BM:  8/16  Height:   Ht Readings from Last 1 Encounters:  01/08/23 6\' 1"  (1.854 m)    Weight:   Wt Readings from Last 1 Encounters:  01/08/23 91.2 kg    Ideal Body Weight:     BMI:  Body mass index is 26.52 kg/m.  Estimated Nutritional Needs:   Kcal:  2300-2600 kcals  Protein:   115-130 gm  Fluid:  >/= 2.3 L  Bethann Humble, RD, LDN, CNSC.

## 2023-01-09 NOTE — Evaluation (Signed)
Occupational Therapy Evaluation Patient Details Name: Matthew [name] MRN: [id number] DOB: 09-08-1930 Today's Date: 01/09/2023   History of Present Illness Matthew [name] is a 89 y.o. male with medical history significant of osteoporosis, recent right hip fracture status post intramedullary rod and femoral neck screw, ESRD on HD MWF, chronic orthostatic hypotension on as needed midodrine, PAF not on anticoagulation due to fall risk, IIDM, presented with worsening right hip pain  Patient recently underwent right hip surgery with intramedullary rod and neck screw after a fall and fracture of right hip and went to local SNF for rehab.  SNF stay was prolonged due to COVID infection was treated with Paxlovid.  Patient was discharged home from SNF 2 weeks ago but's ambulation significant deteriorated compared to when he was in the nursing home.  No significant fall.  This week patient has had significant difficulty to walk up stairs and PT sent him to see orthopedic surgery on Tuesday.  X-ray showed concerning signs for loosening of the screws; now NWB, pending ortho surgery possibly.   Clinical Impression   Patient received for OT evaluation. See flowsheet below for details of function. Generally, patient requiring supervision with HOB raised and bed rails for bed mobility, unable to t/f to standing even with MAX A x2, and set up from seated for UB ADLs and MAX A bed level for LB ADLs. Patient will benefit from continued OT while in acute care. Per pt/family, ortho team is still deciding whether surgical intervention is recommended.       If plan is discharge home, recommend the following: Two people to help with walking and/or transfers;A lot of help with bathing/dressing/bathroom;Assistance with cooking/housework;Assist for transportation;Help with stairs or ramp for entrance    Functional Status Assessment  Patient has had a recent decline in their functional status and demonstrates the ability to  make significant improvements in function in a reasonable and predictable amount of time.  Equipment Recommendations  Other (comment) (defer)    Recommendations for Other Services       Precautions / Restrictions Precautions Precautions: Fall Restrictions Weight Bearing Restrictions: Yes RLE Weight Bearing: Non weight bearing      Mobility Bed Mobility Overal bed mobility: Needs Assistance Bed Mobility: Supine to Sit, Sit to Supine     Supine to sit: Supervision, HOB elevated, Used rails (extra time) Sit to supine: Min assist, +2 for physical assistance   General bed mobility comments: Able to sit EOB for approx 10 minutes;  no significant increase in pain during sitting EOB.    Transfers Overall transfer level: Needs assistance Equipment used: Rolling walker (2 wheels) Transfers: Sit to/from Stand (attempted)             General transfer comment: Attempted sit to stand with MAX A x2 at [facility] with support to maintain RLE in NWB, but pt unable to stand (lifted hips slightly off bed).  Was able to scoot at EOB with MIN A x2 towards the L (HOB).      Balance Overall balance assessment: Needs assistance Sitting-balance support: Feet supported Sitting balance-Leahy Scale: Good                                     ADL either performed or assessed with clinical judgement   ADL Overall ADL's : Needs assistance/impaired  General ADL Comments: Pt is able to sit at EOB with BIL feet supported; would be able to do UB ADLs with set up while seated; LB ADLs will require assistance 2/2 inability to stand even with assistance today. BIL UE WFL strength and movement (to 90 degrees shoulder flexion) today.     Vision Patient Visual Report: No change from baseline       Perception         Praxis         Pertinent Vitals/Pain Pain Assessment Pain Assessment: 0-10 Pain Score: 6  Pain Location: R  leg Pain Descriptors / Indicators: Sore Pain Intervention(s): Limited activity within patient's tolerance, Monitored during session     Extremity/Trunk Assessment Upper Extremity Assessment Upper Extremity Assessment: Defer to OT evaluation   Lower Extremity Assessment Lower Extremity Assessment: Generalized weakness (able to move BLE against gravity but painful on RLE)   Cervical / Trunk Assessment Cervical / Trunk Assessment: Kyphotic (while seated)   Communication Communication Communication: No apparent difficulties   Cognition Arousal: Alert Behavior During Therapy: WFL for tasks assessed/performed Overall Cognitive Status: Within Functional Limits for tasks assessed                                 General Comments: pleasant, oriented to self and visitors and situation     General Comments  On room air. Pleasant and motivated.    Exercises     Shoulder Instructions      Home Living Family/patient expects to be discharged to:: Private residence Living Arrangements: Children Available Help at Discharge: Family;Available PRN/intermittently Type of Home: House Home Access: Stairs to enter [employer] of Steps: 7 total (3 at driveway and 4 at doorway) Entrance Stairs-Rails: Right;Left Home Layout: Able to live on main level with bedroom/bathroom;Two level     Bathroom Shower/Tub: Producer, television/film/video: Handicapped height Bathroom Accessibility: Yes   Home Equipment: Cane - single point;Rollator (4 wheels);Shower seat;Grab bars - toilet;Grab bars - tub/shower;Wheelchair - manual   Additional Comments: Lives with daughter and son-in-law.      Prior Functioning/Environment Prior Level of Function : Independent/Modified Independent             Mobility Comments: uses rollator for ambulation as needed; not driving; ADLs Comments: daughter does cooking/cleaning; he is Mod I for bathing/dressing;        OT Problem List:  Decreased activity tolerance;Decreased range of motion;Decreased strength;Impaired balance (sitting and/or standing)      OT Treatment/Interventions: Self-care/ADL training;Therapeutic exercise;Patient/family education;Therapeutic activities    OT Goals(Current goals can be found in the care plan section) Acute Rehab OT Goals Patient Stated Goal: Get better OT Goal Formulation: With patient/family Time For Goal Achievement: 01/23/23 Potential to Achieve Goals: Good ADL Goals Pt Will Perform Grooming: with modified independence;standing Pt Will Perform Lower Body Dressing: with modified independence;sit to/from stand Pt Will Transfer to Toilet: with min assist;bedside commode Pt Will Perform Toileting - Clothing Manipulation and hygiene: with min assist;sit to/from stand  OT Frequency: Min 1X/week    Co-evaluation PT/OT/SLP Co-Evaluation/Treatment: Yes Reason for Co-Treatment: Complexity of the patient's impairments (multi-system involvement);For patient/therapist safety PT goals addressed during session: Mobility/safety with mobility;Balance;Strengthening/ROM OT goals addressed during session: Proper use of Adaptive equipment and DME      AM-PAC OT "6 Clicks" Daily Activity     Outcome Measure Help from another person eating meals?: None Help from another person  taking care of personal grooming?: None Help from another person toileting, which includes using toliet, bedpan, or urinal?: A Lot Help from another person bathing (including washing, rinsing, drying)?: A Lot Help from another person to put on and taking off regular upper body clothing?: None Help from another person to put on and taking off regular lower body clothing?: A Lot 6 Click Score: 18   End of Session Equipment Utilized During Treatment: Rolling walker (2 wheels) Nurse Communication: Mobility status  Activity Tolerance: Patient tolerated treatment well Patient left: in bed;with call bell/phone within reach;with  bed alarm set;with family/visitor present (PT in room doing exercises)  OT Visit Diagnosis: Muscle weakness (generalized) (M62.81);History of falling (Z91.81)                Time: 1335-1350 OT Time Calculation (min): 15 min Charges:  OT General Charges $OT Visit: 1 Visit OT Evaluation $OT Eval Moderate Complexity: 1 Mod  Kysa Calais Junie Panning, MS, OTR/L  Kelijah Meek 01/09/2023, 3:40 PM

## 2023-01-09 NOTE — Progress Notes (Signed)
[facility]  ROUNDING NOTE   Subjective:  Patient well-known to [place] as we follow him for outpatient hemodialysis treatments. Did undergo full treatment on Monday. Patient had recent right hip fracture and was status post intramedullary rod and femoral neck screw. He subsequently went to SNF and developed COVID-19 infection. X-ray now shows loosening of screws in the right hip. He is currently nonweightbearing.   Objective:  Vital signs in last 24 hours:  Temp:  [97.5 F (36.4 C)-98.1 F (36.7 C)] 97.7 F (36.5 C) (08/18 0929) Pulse Rate:  [55-90] 60 (08/18 0929) Resp:  [16-18] 18 (08/18 0929) BP: (99-119)/(49-74) 119/74 (08/18 0929) SpO2:  [94 %-97 %] 96 % (08/18 0929)  Weight change:  Filed Weights   01/08/23 1401  Weight: 91.2 kg    Intake/Output: I/O last 3 completed shifts: In: 240 [P.O.:240] Out: 125 [Urine:125]   Intake/Output this shift:  Total I/O In: 720 [P.O.:720] Out: -   Physical Exam: General: No acute distress  Head: Normocephalic, atraumatic. Moist oral mucosal membranes  Neck: Supple  Lungs:  Clear to auscultation, normal effort  Heart: S1S2 no rubs  Abdomen:  Soft, nontender, bowel sounds present  Extremities: Trace peripheral edema.  Neurologic: Awake, alert, following commands  Skin: No acute rash  Access: Left upper extremity AV graft    Basic Metabolic Panel: Recent Labs  Lab 01/08/23 1358  NA 134*  K 4.4  CL 94*  CO2 28  GLUCOSE 154*  BUN 65*  CREATININE 2.93*  CALCIUM 8.7*    Liver Function Tests: No results for input(s): "AST", "ALT", "ALKPHOS", "BILITOT", "PROT", "ALBUMIN" in the last 168 hours. No results for input(s): "LIPASE", "AMYLASE" in the last 168 hours. No results for input(s): "AMMONIA" in the last 168 hours.  CBC: Recent Labs  Lab 01/08/23 1358  WBC 6.0  NEUTROABS 4.2  HGB 11.2*  HCT 35.5*  MCV 99.7  PLT 131*    Cardiac Enzymes: No results for input(s): "CKTOTAL", "CKMB", "CKMBINDEX",  "TROPONINI" in the last 168 hours.  BNP: Invalid input(s): "POCBNP"  CBG: Recent Labs  Lab 01/08/23 2106 01/09/23 0744 01/09/23 1126  GLUCAP 78 91 146*    Microbiology: Results for orders placed or performed in visit on 10/15/22  Culture, Urine     Status: Abnormal   Collection Time: 10/15/22  2:05 PM   Specimen: Urine   UC  Result Value Ref Range Status   Urine Culture, Routine Final report (A)  Final   Organism ID, Bacteria Escherichia coli (A)  Final    Comment: Cefazolin <=4 ug/mL Cefazolin with an MIC <=16 predicts susceptibility to the oral agents cefaclor, cefdinir, cefpodoxime, cefprozil, cefuroxime, cephalexin, and loracarbef when used for therapy of uncomplicated urinary tract infections due to E. coli, Klebsiella pneumoniae, and Proteus mirabilis. Greater than 100,000 colony forming units per mL    Antimicrobial Susceptibility Comment  Final    Comment:       ** S = Susceptible; I = Intermediate; R = Resistant **                    P = Positive; N = Negative             MICS are expressed in micrograms per mL    Antibiotic                 RSLT#1    RSLT#2    RSLT#3    RSLT#4 Amoxicillin/Clavulanic Acid    S Ampicillin  S Cefepime                       S Ceftriaxone                    S Cefuroxime                     S Ciprofloxacin                  S Ertapenem                      S Gentamicin                     S Imipenem                       S Levofloxacin                   S Meropenem                      S Nitrofurantoin                 S Piperacillin/Tazobactam        S Tetracycline                   S Tobramycin                     S Trimethoprim/Sulfa             S     Coagulation Studies: No results for input(s): "LABPROT", "INR" in the last 72 hours.  Urinalysis: No results for input(s): "COLORURINE", "LABSPEC", "PHURINE", "GLUCOSEU", "HGBUR", "BILIRUBINUR", "KETONESUR", "PROTEINUR", "UROBILINOGEN", "NITRITE",  "LEUKOCYTESUR" in the last 72 hours.  Invalid input(s): "APPERANCEUR"    Imaging: DG Femur Portable Min 2 Views Right  Result Date: 01/08/2023 CLINICAL DATA:  Right hip surgery in June with worsening pain in the right hip over the past week. EXAM: RIGHT FEMUR PORTABLE 2 VIEW COMPARISON:  Right hip radiographs dated 11/12/2022. FINDINGS: The patient is status post placement of a femoral shaft intramedullary nail and femoral neck screw on the right. Compared to 11/12/2022, the femoral neck screw has advanced with the tip now extending slightly beyond the right femoral head and into the right hip joint space. No new fracture is identified, however there is suggestion of compression of the intertrochanteric fracture of the right femur compared to prior exam. A displaced fracture fragment of the right lesser trochanter is redemonstrated. IMPRESSION: Suggestion of compression of the intertrochanteric fracture of the right femur compared to prior exam as well as interval advancement of the femoral neck screw with the tip now extending slightly beyond the right femoral head and into the right hip joint space. Electronically Signed   By: Romona Curls M.D.   On: 01/08/2023 15:01   DG Hip Unilat W or Wo Pelvis 2-3 Views Right  Result Date: 01/08/2023 CLINICAL DATA:  Right hip surgery in June with worsening pain in the right hip over the past week. EXAM: DG HIP (WITH OR WITHOUT PELVIS) 2-3V RIGHT COMPARISON:  Right hip radiographs dated 11/12/2022. FINDINGS: The patient is status post placement of a femoral shaft intramedullary nail and femoral neck screw on the right. Compared to 11/12/2022, the femoral neck screw has advanced with the tip now extending slightly beyond the  right femoral head and into the right hip joint space. No new fracture is identified, however there is suggestion of compression of the intertrochanteric fracture of the right femur compared to prior exam. A displaced fracture fragment of the  right lesser trochanter is redemonstrated. IMPRESSION: Suggestion of compression of the intertrochanteric fracture of the right femur compared to prior exam as well as interval advancement of the femoral neck screw with the tip now extending slightly beyond the right femoral head and into the right hip joint space. Electronically Signed   By: Romona Curls M.D.   On: 01/08/2023 15:00     Medications:     cholecalciferol  1,000 Units Oral Q breakfast   clopidogrel  75 mg Oral Daily   feeding supplement  237 mL Oral BID BM   furosemide  40 mg Oral Daily   heparin  5,000 Units Subcutaneous Q12H   [START ON 01/10/2023] levothyroxine  88 mcg Oral QAC breakfast   loratadine  10 mg Oral Daily   multivitamin  1 tablet Oral QHS   polyethylene glycol  17 g Oral Daily   senna-docusate  2 tablet Oral BID   sotalol  80 mg Oral BID   bisacodyl, fluticasone, HYDROcodone-acetaminophen, HYDROmorphone (DILAUDID) injection, midodrine, zolpidem  Assessment/ Plan:  89 y.o. male with past medical history of ESRD on HD MWF, right hip fracture status post intramedullary rod and femoral neck screw, anemia of chronic kidney disease, secondary hyperparathyroidism, chronic orthostatic hypotension, atrial fibrillation not on anticoagulation due to fall risk, diabetes mellitus type 2 who presented with worsening right hip pain and found to have loosening of screw in the hip.  1.  ESRD on HD MWF.  No acute indication for dialysis treatment today.  We will plan for hemodialysis treatment again tomorrow.  2.  Hypotension.  Restart the patient on midodrine 10 mg prior to dialysis treatments.  3.  Anemia chronic kidney disease.  Patient receives Mircera as outpatient.  Hemoglobin currently 11.2.  Continue to monitor CBC.  4.  Secondary hyperparathyroidism.  Monitor bone metabolism parameters over the course of hospitalization.  5.  Recent right hip fracture status post intramedullary rod and femoral neck screw.  It  appears that the screw has loosened and possibly displaced.  Awaiting further input from orthopedics.   LOS: 1 Meggie Laseter 8/18/20244:26 PM

## 2023-01-09 NOTE — Plan of Care (Signed)
Patient A+Ox4, denies pain. Bedrest. NWB to RLE. Eating well.   Problem: Education: Goal: Knowledge of General Education information will improve Description: Including pain rating scale, medication(s)/side effects and non-pharmacologic comfort measures Outcome: Progressing   Problem: Clinical Measurements: Goal: Will remain free from infection Outcome: Progressing   Problem: Nutrition: Goal: Adequate nutrition will be maintained Outcome: Progressing   Problem: Pain Managment: Goal: General experience of comfort will improve Outcome: Progressing   Problem: Safety: Goal: Ability to remain free from injury will improve Outcome: Progressing

## 2023-01-09 NOTE — Evaluation (Signed)
Physical Therapy Evaluation Patient Details Name: Matthew [name] MRN: [id number] DOB: [birth year] Today's Date: 01/09/2023  History of Present Illness  Matthew [name] is a 89 y.o. male with medical history significant of osteoporosis, recent right hip fracture status post intramedullary rod and femoral neck screw, ESRD on HD MWF, chronic orthostatic hypotension on as needed midodrine, PAF not on anticoagulation due to fall risk, IIDM, presented with worsening right hip pain  Patient recently underwent right hip surgery with intramedullary rod and neck screw after a fall and fracture of right hip and went to local SNF for rehab.  SNF stay was prolonged due to COVID infection was treated with Paxlovid.  Patient was discharged home from SNF 2 weeks ago but's ambulation significant deteriorated compared to when he was in the nursing home.  No significant fall.  This week patient has had significant difficulty to walk up stairs and PT sent him to see orthopedic surgery on Tuesday.  X-ray showed concerning signs for loosening of the screws; now NWB, pending ortho surgery possibly.'   Clinical Impression  Pt alert, oriented to self, family and situation upon PT entrance for OT/PT co-evaluation. Per family/pt he has had a decline in mobility since returning to home due to worsening hip pain, but still performing as much of ADLs and mobility as independently as possible, currently unable to ambulate and maintain NWB.   Sit <> stand with RW and maxX2 and PT assist for maintaining RLE NWB. Pt unable to avoid weight in RLE, despite two attempts. He was able to laterally scoot on EOB with minAx2 but also exhibited fatigue. Sit to supine with minA for RLE assist.  Overall the patient demonstrated deficits (see "PT Problem List") that impede the patient's functional abilities, safety, and mobility and would benefit from skilled PT intervention.          If plan is discharge home, recommend the following: Two people  to help with walking and/or transfers;Two people to help with bathing/dressing/bathroom;Help with stairs or ramp for entrance;Assist for transportation;Assistance with cooking/housework   Can travel by private vehicle   No    Equipment Recommendations Other (comment) (TBD at next venue of care)  Recommendations for Other Services       Functional Status Assessment Patient has had a recent decline in their functional status and demonstrates the ability to make significant improvements in function in a reasonable and predictable amount of time.     Precautions / Restrictions Precautions Precautions: Fall Restrictions Weight Bearing Restrictions: Yes RLE Weight Bearing: Non weight bearing      Mobility  Bed Mobility Overal bed mobility: Needs Assistance Bed Mobility: Sit to Supine       Sit to supine: Min assist, +2 for safety/equipment   General bed mobility comments: sitting EOB with OT upon PT entrance, minA for RLE assistance    Transfers Overall transfer level: Needs assistance Equipment used: Rolling walker (2 wheels) Transfers: Sit to/from Stand, Bed to chair/wheelchair/BSC Sit to Stand: Max assist, +2 physical assistance           General transfer comment: unable to maintain NWB; able to scoot EOB with minAx2 to L, PT maintaining NWB in RLE    Ambulation/Gait               General Gait Details: unable at this time  Stairs            Wheelchair Mobility     Tilt Bed    Modified Rankin (Stroke  Patients Only)       Balance Overall balance assessment: Needs assistance Sitting-balance support: Feet supported Sitting balance-Leahy Scale: Good       Standing balance-Leahy Scale: Zero                               Pertinent Vitals/Pain Pain Assessment Pain Assessment: 0-10 Pain Score: 6  Pain Location: R leg pain Pain Descriptors / Indicators: Sore Pain Intervention(s): Limited activity within patient's tolerance,  Monitored during session, Repositioned    Home Living Family/patient expects to be discharged to:: Private residence Living Arrangements: Children Available Help at Discharge: Family;Available PRN/intermittently Type of Home: House Home Access: Stairs to enter Entrance Stairs-Rails: Doctor, general practice of Steps: 7 total (3 at driveway and 4 at doorway)   Home Layout: Able to live on main level with bedroom/bathroom;Two level Home Equipment: Cane - single point;Rollator (4 wheels);Shower seat;Grab bars - toilet;Grab bars - tub/shower;Wheelchair - manual Additional Comments: Lives with daughter and son-in-law.    Prior Function Prior Level of Function : Independent/Modified Independent             Mobility Comments: uses rollator for ambulation as needed; not driving; ADLs Comments: daughter does cooking/cleaning; he is Mod I for bathing/dressing;     Extremity/Trunk Assessment   Upper Extremity Assessment Upper Extremity Assessment: Defer to OT evaluation    Lower Extremity Assessment Lower Extremity Assessment: Generalized weakness (able to move BLE against gravity but painful on RLE)       Communication      Cognition Arousal: Alert Behavior During Therapy: [facility] for tasks assessed/performed                                   General Comments: pt oriented to self, visitors, situation        General Comments      Exercises Other Exercises Other Exercises: SAQ and heel slides x10 bilaterally heel slides AAROM for RLE   Assessment/Plan    PT Assessment Patient needs continued PT services  PT Problem List Decreased strength;Decreased range of motion;Decreased activity tolerance;Decreased balance;Decreased mobility;Decreased knowledge of precautions;Decreased knowledge of use of DME       PT Treatment Interventions DME instruction;Neuromuscular re-education;Gait training;Stair training;Patient/family education;Functional mobility  training;Therapeutic activities;Therapeutic exercise;Balance training    PT Goals (Current goals can be found in the Care Plan section)  Acute Rehab PT Goals Patient Stated Goal: to return to PLOF PT Goal Formulation: With patient Time For Goal Achievement: 01/23/23 Potential to Achieve Goals: Good    Frequency Min 1X/week     Co-evaluation PT/OT/SLP Co-Evaluation/Treatment: Yes Reason for Co-Treatment: Complexity of the patient's impairments (multi-system involvement);For patient/therapist safety PT goals addressed during session: Mobility/safety with mobility;Balance;Strengthening/ROM OT goals addressed during session: Proper use of Adaptive equipment and DME       AM-PAC PT "6 Clicks" Mobility  Outcome Measure Help needed turning from your back to your side while in a flat bed without using bedrails?: A Little Help needed moving from lying on your back to sitting on the side of a flat bed without using bedrails?: A Little Help needed moving to and from a bed to a chair (including a wheelchair)?: A Lot Help needed standing up from a chair using your arms (e.g., wheelchair or bedside chair)?: Total Help needed to walk in hospital room?: Total Help needed climbing 3-5 steps  with a railing? : Total 6 Click Score: 11    End of Session Equipment Utilized During Treatment: Gait belt Activity Tolerance: Patient limited by fatigue Patient left: in bed;with call bell/phone within reach;with bed alarm set Nurse Communication: Mobility status PT Visit Diagnosis: Other abnormalities of gait and mobility (R26.89);Difficulty in walking, not elsewhere classified (R26.2);Pain;Muscle weakness (generalized) (M62.81) Pain - Right/Left: Right Pain - part of body: Hip    Time: 1345-1359 PT Time Calculation (min) (ACUTE ONLY): 14 min   Charges:   PT Evaluation $PT Eval Low Complexity: 1 Low PT Treatments $Therapeutic Exercise: 8-22 mins PT General Charges $$ ACUTE PT VISIT: 1 Visit        Olga Coaster PT, DPT 3:18 PM,01/09/23

## 2023-01-09 NOTE — TOC Initial Note (Signed)
Transition of Care [facility]) - Initial/Assessment Note    Patient Details  Name: Matthew [name] MRN: [id number] Date of Birth: 1930-07-04  Transition of Care [facility]) CM/SW Contact:    Liliana Cline, LCSW Phone Number: 01/09/2023, 4:42 PM  Clinical Narrative:                 CSW spoke with patient's daughter Matthew [name] by phone.  Patient is from home with Matthew [name]. Patient goes to HD MWF at [facility] at 11:15.  Patient was at [facility] for STR from 6/28 to 8/3.  Matthew [name] states they are agreeable to PT rec for additional STR. She states at baseline patient was very active and wants to get back to his baseline. She is not sure if they want to go to [facility] or somewhere else. She is aware patient will need a 3 night stay.  SNF workup started.    Expected Discharge Plan: Skilled Nursing Facility Barriers to Discharge: Continued Medical Work up   Patient Goals and CMS Choice Patient states their goals for this hospitalization and ongoing recovery are:: SNF CMS Medicare.gov Compare Post Acute Care list provided to:: Patient Represenative (must comment) Choice offered to / list presented to : [facility] POA / Guardian      Expected Discharge Plan and Services       Living arrangements for the past 2 months: Single Family Home                                      Prior Living Arrangements/Services Living arrangements for the past 2 months: Single Family Home Lives with:: Adult Children Patient language and need for interpreter reviewed:: Yes Do you feel safe going back to the place where you live?: Yes      Need for Family Participation in Patient Care: Yes (Comment) Care giver support system in place?: Yes (comment) Current home services: DME Criminal Activity/Legal Involvement Pertinent to Current Situation/Hospitalization: No - Comment as needed  Activities of Daily Living      Permission Sought/Granted Permission sought to share information with : Facility Games developer granted to share information with : Yes, Verbal Permission Granted     Permission granted to share info w AGENCY: SNF        Emotional Assessment       Orientation: : Oriented to Self, Oriented to Place, Oriented to  Time, Oriented to Situation Alcohol / Substance Use: Not Applicable Psych Involvement: No (comment)  Admission diagnosis:  Hip fracture (HCC) [S72.009A] Right hip pain [M25.551] Postoperative surgical complication involving musculoskeletal system associated with musculoskeletal procedure, unspecified complication [M96.89] Patient Active Problem List   Diagnosis Date Noted   Hip fracture (HCC) 01/08/2023   Drop in hemoglobin 11/16/2022   Hyperkalemia 11/15/2022   Closed hip fracture requiring operative repair, right, sequela 11/13/2022   HFrEF (heart failure with reduced ejection fraction) (HCC) 11/12/2022   Transaminitis 11/12/2022   Age-related osteoporosis with current pathological fracture with routine healing 02/18/2021   Pressure injury of skin 02/15/2021   Pelvic fracture (HCC) 02/11/2021   End-stage renal disease on hemodialysis (HCC)    Chronic hypotension 12/26/2020   Tremor    Hypothyroidism    Chronic systolic CHF (congestive heart failure) (HCC)    Proximal humerus fracture 12/25/2020   Malposition of peritoneal dialysis catheter (HCC) 11/01/2020   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   Secondary hyperparathyroidism (HCC)  04/30/2018   Sepsis (HCC) 02/15/2018   Cardiomyopathy (HCC) 01/24/2018   Fluid overload 01/10/2018   Climacteric arthritis, lower leg 12/15/2017   Mitral regurgitation 12/15/2017   Hypoglycemia 12/09/2017   Osteoarthritis of knee 06/23/2016   Undiagnosed cardiac murmurs 09/04/2015   Aneurysm of thoracic aorta (HCC) 08/04/2015   Congestive heart failure (HCC) 08/04/2015   Postsurgical aortocoronary bypass status 08/04/2015   Chronic kidney disease 07/18/2015   Aortic root dilatation (HCC)  07/18/2015   Dissecting aortic aneurysm, 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/2015   Edema of both legs 07/18/2015   Shortness of breath 07/18/2015   Atrial fibrillation (HCC) 01/10/2014   Abnormal electrocardiogram 01/10/2014   Encounter for counseling 01/10/2014   Other general symptoms 01/10/2014   CAD (coronary artery disease) 12/04/2013   Unstable angina; Class III Angina 12/03/2013   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 [place] [address] Phone: [phone] Fax: [phone]  [facility] - [place], [place] - [address] [place] [address] Phone: [phone] Fax: [phone]     Social Determinants of Health (SDOH) Social History: SDOH Screenings   Food Insecurity: No Food Insecurity (11/12/2022)  Housing: Low Risk  (11/12/2022)  Transportation Needs: No Transportation Needs (11/12/2022)  Utilities: Not At Risk (11/12/2022)  Depression (PHQ2-9): Low Risk  (09/21/2022)  Financial Resource Strain: Low Risk  (01/10/2018)  Physical Activity: Inactive (01/10/2018)  Social Connections: Socially Integrated (01/10/2018)  Stress: No Stress Concern Present (01/10/2018)  Tobacco Use: Medium Risk (01/08/2023)   SDOH Interventions:     Readmission Risk Interventions    01/09/2023    4:42 PM 02/12/2021    3:28 PM  Readmission Risk Prevention Plan  Transportation Screening Complete Complete  PCP or Specialist Appt within 3-5  Days Complete   HRI or Home Care Consult Complete   Social Work Consult for Recovery Care Planning/Counseling Complete   Palliative Care Screening Not Applicable   Medication Review Oceanographer) Complete Complete  SW Recovery Care/Counseling Consult  Complete  Skilled Nursing Facility  Complete

## 2023-01-09 NOTE — Plan of Care (Signed)

## 2023-01-10 ENCOUNTER — Inpatient Hospital Stay (HOSPITAL_COMMUNITY): Payer: Medicare Other

## 2023-01-10 ENCOUNTER — Inpatient Hospital Stay: Payer: Medicare Other

## 2023-01-10 ENCOUNTER — Encounter (HOSPITAL_COMMUNITY): Payer: Self-pay | Admitting: Anesthesiology

## 2023-01-10 ENCOUNTER — Inpatient Hospital Stay (HOSPITAL_COMMUNITY)
Admit: 2023-01-10 | Discharge: 2023-01-17 | DRG: 536 | Disposition: A | Discharge status: Skilled Nursing Facility | Payer: Medicare Other | Source: Other Acute Inpatient Hospital | Attending: Internal Medicine | Admitting: Internal Medicine

## 2023-01-10 ENCOUNTER — Encounter (HOSPITAL_COMMUNITY): Payer: Self-pay

## 2023-01-10 DIAGNOSIS — D696 Thrombocytopenia, unspecified: Secondary | ICD-10-CM | POA: Diagnosis present

## 2023-01-10 DIAGNOSIS — E039 Hypothyroidism, unspecified: Secondary | ICD-10-CM | POA: Diagnosis present

## 2023-01-10 DIAGNOSIS — E8889 Other specified metabolic disorders: Secondary | ICD-10-CM | POA: Diagnosis not present

## 2023-01-10 DIAGNOSIS — I9589 Other hypotension: Secondary | ICD-10-CM | POA: Diagnosis not present

## 2023-01-10 DIAGNOSIS — Z992 Dependence on renal dialysis: Secondary | ICD-10-CM | POA: Diagnosis not present

## 2023-01-10 DIAGNOSIS — S72001A Fracture of unspecified part of neck of right femur, initial encounter for closed fracture: Secondary | ICD-10-CM | POA: Diagnosis not present

## 2023-01-10 DIAGNOSIS — Z85828 Personal history of other malignant neoplasm of skin: Secondary | ICD-10-CM | POA: Diagnosis not present

## 2023-01-10 DIAGNOSIS — D631 Anemia in chronic kidney disease: Secondary | ICD-10-CM | POA: Diagnosis present

## 2023-01-10 DIAGNOSIS — S72141D Displaced intertrochanteric fracture of right femur, subsequent encounter for closed fracture with routine healing: Secondary | ICD-10-CM | POA: Diagnosis not present

## 2023-01-10 DIAGNOSIS — T84418A Breakdown (mechanical) of other internal orthopedic devices, implants and grafts, initial encounter: Secondary | ICD-10-CM | POA: Diagnosis not present

## 2023-01-10 DIAGNOSIS — Y838 Other surgical procedures as the cause of abnormal reaction of the patient, or of later complication, without mention of misadventure at the time of the procedure: Secondary | ICD-10-CM | POA: Diagnosis present

## 2023-01-10 DIAGNOSIS — M810 Age-related osteoporosis without current pathological fracture: Secondary | ICD-10-CM | POA: Diagnosis not present

## 2023-01-10 DIAGNOSIS — I5022 Chronic systolic (congestive) heart failure: Secondary | ICD-10-CM | POA: Diagnosis present

## 2023-01-10 DIAGNOSIS — Z79890 Hormone replacement therapy (postmenopausal): Secondary | ICD-10-CM

## 2023-01-10 DIAGNOSIS — I252 Old myocardial infarction: Secondary | ICD-10-CM | POA: Diagnosis not present

## 2023-01-10 DIAGNOSIS — M25551 Pain in right hip: Secondary | ICD-10-CM | POA: Diagnosis not present

## 2023-01-10 DIAGNOSIS — R609 Edema, unspecified: Secondary | ICD-10-CM | POA: Diagnosis not present

## 2023-01-10 DIAGNOSIS — I480 Paroxysmal atrial fibrillation: Secondary | ICD-10-CM | POA: Diagnosis not present

## 2023-01-10 DIAGNOSIS — S72141A Displaced intertrochanteric fracture of right femur, initial encounter for closed fracture: Principal | ICD-10-CM | POA: Diagnosis present

## 2023-01-10 DIAGNOSIS — E785 Hyperlipidemia, unspecified: Secondary | ICD-10-CM | POA: Diagnosis present

## 2023-01-10 DIAGNOSIS — Z79899 Other long term (current) drug therapy: Secondary | ICD-10-CM

## 2023-01-10 DIAGNOSIS — Z951 Presence of aortocoronary bypass graft: Secondary | ICD-10-CM

## 2023-01-10 DIAGNOSIS — I5020 Unspecified systolic (congestive) heart failure: Secondary | ICD-10-CM | POA: Diagnosis not present

## 2023-01-10 DIAGNOSIS — K5900 Constipation, unspecified: Secondary | ICD-10-CM | POA: Diagnosis not present

## 2023-01-10 DIAGNOSIS — E8809 Other disorders of plasma-protein metabolism, not elsewhere classified: Secondary | ICD-10-CM | POA: Diagnosis present

## 2023-01-10 DIAGNOSIS — S72141K Displaced intertrochanteric fracture of right femur, subsequent encounter for closed fracture with nonunion: Secondary | ICD-10-CM | POA: Diagnosis not present

## 2023-01-10 DIAGNOSIS — R296 Repeated falls: Secondary | ICD-10-CM | POA: Diagnosis present

## 2023-01-10 DIAGNOSIS — Z888 Allergy status to other drugs, medicaments and biological substances status: Secondary | ICD-10-CM

## 2023-01-10 DIAGNOSIS — T8484XA Pain due to internal orthopedic prosthetic devices, implants and grafts, initial encounter: Secondary | ICD-10-CM | POA: Diagnosis not present

## 2023-01-10 DIAGNOSIS — I4891 Unspecified atrial fibrillation: Secondary | ICD-10-CM | POA: Diagnosis present

## 2023-01-10 DIAGNOSIS — I71019 Dissection of thoracic aorta, unspecified: Secondary | ICD-10-CM | POA: Diagnosis present

## 2023-01-10 DIAGNOSIS — F1090 Alcohol use, unspecified, uncomplicated: Secondary | ICD-10-CM | POA: Diagnosis not present

## 2023-01-10 DIAGNOSIS — K219 Gastro-esophageal reflux disease without esophagitis: Secondary | ICD-10-CM | POA: Diagnosis present

## 2023-01-10 DIAGNOSIS — E1122 Type 2 diabetes mellitus with diabetic chronic kidney disease: Secondary | ICD-10-CM | POA: Diagnosis present

## 2023-01-10 DIAGNOSIS — Z87891 Personal history of nicotine dependence: Secondary | ICD-10-CM

## 2023-01-10 DIAGNOSIS — I132 Hypertensive heart and chronic kidney disease with heart failure and with stage 5 chronic kidney disease, or end stage renal disease: Secondary | ICD-10-CM | POA: Diagnosis not present

## 2023-01-10 DIAGNOSIS — Z66 Do not resuscitate: Secondary | ICD-10-CM | POA: Diagnosis not present

## 2023-01-10 DIAGNOSIS — D638 Anemia in other chronic diseases classified elsewhere: Secondary | ICD-10-CM | POA: Diagnosis not present

## 2023-01-10 DIAGNOSIS — M1990 Unspecified osteoarthritis, unspecified site: Secondary | ICD-10-CM | POA: Diagnosis not present

## 2023-01-10 DIAGNOSIS — E559 Vitamin D deficiency, unspecified: Secondary | ICD-10-CM | POA: Diagnosis not present

## 2023-01-10 DIAGNOSIS — I953 Hypotension of hemodialysis: Secondary | ICD-10-CM | POA: Diagnosis not present

## 2023-01-10 DIAGNOSIS — E11649 Type 2 diabetes mellitus with hypoglycemia without coma: Secondary | ICD-10-CM | POA: Diagnosis not present

## 2023-01-10 DIAGNOSIS — M1611 Unilateral primary osteoarthritis, right hip: Secondary | ICD-10-CM | POA: Diagnosis not present

## 2023-01-10 DIAGNOSIS — N250 Renal osteodystrophy: Secondary | ICD-10-CM | POA: Diagnosis not present

## 2023-01-10 DIAGNOSIS — Z96641 Presence of right artificial hip joint: Secondary | ICD-10-CM | POA: Diagnosis not present

## 2023-01-10 DIAGNOSIS — R54 Age-related physical debility: Secondary | ICD-10-CM | POA: Diagnosis present

## 2023-01-10 DIAGNOSIS — R600 Localized edema: Secondary | ICD-10-CM | POA: Diagnosis not present

## 2023-01-10 DIAGNOSIS — T84124A Displacement of internal fixation device of right femur, initial encounter: Secondary | ICD-10-CM | POA: Diagnosis present

## 2023-01-10 DIAGNOSIS — I2510 Atherosclerotic heart disease of native coronary artery without angina pectoris: Secondary | ICD-10-CM | POA: Diagnosis present

## 2023-01-10 DIAGNOSIS — I429 Cardiomyopathy, unspecified: Secondary | ICD-10-CM | POA: Diagnosis present

## 2023-01-10 DIAGNOSIS — N186 End stage renal disease: Secondary | ICD-10-CM

## 2023-01-10 DIAGNOSIS — I5023 Acute on chronic systolic (congestive) heart failure: Secondary | ICD-10-CM | POA: Diagnosis present

## 2023-01-10 DIAGNOSIS — I25810 Atherosclerosis of coronary artery bypass graft(s) without angina pectoris: Secondary | ICD-10-CM | POA: Diagnosis not present

## 2023-01-10 DIAGNOSIS — I4819 Other persistent atrial fibrillation: Secondary | ICD-10-CM | POA: Diagnosis not present

## 2023-01-10 DIAGNOSIS — I130 Hypertensive heart and chronic kidney disease with heart failure and stage 1 through stage 4 chronic kidney disease, or unspecified chronic kidney disease: Secondary | ICD-10-CM | POA: Diagnosis not present

## 2023-01-10 DIAGNOSIS — I517 Cardiomegaly: Secondary | ICD-10-CM | POA: Diagnosis not present

## 2023-01-10 DIAGNOSIS — Z01810 Encounter for preprocedural cardiovascular examination: Secondary | ICD-10-CM

## 2023-01-10 DIAGNOSIS — S72001S Fracture of unspecified part of neck of right femur, sequela: Secondary | ICD-10-CM | POA: Diagnosis not present

## 2023-01-10 DIAGNOSIS — W19XXXA Unspecified fall, initial encounter: Secondary | ICD-10-CM | POA: Diagnosis present

## 2023-01-10 DIAGNOSIS — J9811 Atelectasis: Secondary | ICD-10-CM | POA: Diagnosis not present

## 2023-01-10 DIAGNOSIS — I951 Orthostatic hypotension: Secondary | ICD-10-CM | POA: Diagnosis present

## 2023-01-10 DIAGNOSIS — G4700 Insomnia, unspecified: Secondary | ICD-10-CM | POA: Diagnosis present

## 2023-01-10 DIAGNOSIS — L89152 Pressure ulcer of sacral region, stage 2: Secondary | ICD-10-CM | POA: Diagnosis present

## 2023-01-10 DIAGNOSIS — I482 Chronic atrial fibrillation, unspecified: Secondary | ICD-10-CM | POA: Diagnosis present

## 2023-01-10 DIAGNOSIS — R9431 Abnormal electrocardiogram [ECG] [EKG]: Secondary | ICD-10-CM | POA: Diagnosis not present

## 2023-01-10 DIAGNOSIS — D509 Iron deficiency anemia, unspecified: Secondary | ICD-10-CM | POA: Diagnosis present

## 2023-01-10 DIAGNOSIS — R011 Cardiac murmur, unspecified: Secondary | ICD-10-CM | POA: Diagnosis present

## 2023-01-10 DIAGNOSIS — S72001P Fracture of unspecified part of neck of right femur, subsequent encounter for closed fracture with malunion: Secondary | ICD-10-CM | POA: Diagnosis not present

## 2023-01-10 DIAGNOSIS — R0989 Other specified symptoms and signs involving the circulatory and respiratory systems: Secondary | ICD-10-CM | POA: Diagnosis not present

## 2023-01-10 DIAGNOSIS — Z8249 Family history of ischemic heart disease and other diseases of the circulatory system: Secondary | ICD-10-CM

## 2023-01-10 DIAGNOSIS — S72141G Displaced intertrochanteric fracture of right femur, subsequent encounter for closed fracture with delayed healing: Secondary | ICD-10-CM | POA: Diagnosis not present

## 2023-01-10 DIAGNOSIS — Z7984 Long term (current) use of oral hypoglycemic drugs: Secondary | ICD-10-CM

## 2023-01-10 DIAGNOSIS — E7800 Pure hypercholesterolemia, unspecified: Secondary | ICD-10-CM | POA: Diagnosis not present

## 2023-01-10 DIAGNOSIS — Z7982 Long term (current) use of aspirin: Secondary | ICD-10-CM | POA: Diagnosis not present

## 2023-01-10 DIAGNOSIS — Z471 Aftercare following joint replacement surgery: Secondary | ICD-10-CM | POA: Diagnosis not present

## 2023-01-10 DIAGNOSIS — Z7902 Long term (current) use of antithrombotics/antiplatelets: Secondary | ICD-10-CM

## 2023-01-10 DIAGNOSIS — Z880 Allergy status to penicillin: Secondary | ICD-10-CM

## 2023-01-10 LAB — BASIC METABOLIC PANEL
Anion gap: 12 (ref 5–15)
BUN: 85 mg/dL — ABNORMAL HIGH (ref 8–23)
CO2: 28 mmol/L (ref 22–32)
Calcium: 8.8 mg/dL — ABNORMAL LOW (ref 8.9–10.3)
Chloride: 94 mmol/L — ABNORMAL LOW (ref 98–111)
Creatinine, Ser: 3.76 mg/dL — ABNORMAL HIGH (ref 0.61–1.24)
GFR, Estimated: 15 mL/min — ABNORMAL LOW (ref >60)
Glucose, Bld: 116 mg/dL — ABNORMAL HIGH (ref 70–99)
Potassium: 4.7 mmol/L (ref 3.5–5.1)
Sodium: 134 mmol/L — ABNORMAL LOW (ref 135–145)

## 2023-01-10 LAB — COMPREHENSIVE METABOLIC PANEL
ALT: 24 U/L (ref 0–44)
AST: 27 U/L (ref 15–41)
Albumin: 2.7 g/dL — ABNORMAL LOW (ref 3.5–5.0)
Alkaline Phosphatase: 236 U/L — ABNORMAL HIGH (ref 38–126)
Anion gap: 14 (ref 5–15)
BUN: 46 mg/dL — ABNORMAL HIGH (ref 8–23)
CO2: 28 mmol/L (ref 22–32)
Calcium: 8.8 mg/dL — ABNORMAL LOW (ref 8.9–10.3)
Chloride: 92 mmol/L — ABNORMAL LOW (ref 98–111)
Creatinine, Ser: 2.72 mg/dL — ABNORMAL HIGH (ref 0.61–1.24)
GFR, Estimated: 22 mL/min — ABNORMAL LOW (ref >60)
Glucose, Bld: 123 mg/dL — ABNORMAL HIGH (ref 70–99)
Potassium: 4.4 mmol/L (ref 3.5–5.1)
Sodium: 134 mmol/L — ABNORMAL LOW (ref 135–145)
Total Bilirubin: 0.4 mg/dL (ref 0.3–1.2)
Total Protein: 6.5 g/dL (ref 6.5–8.1)

## 2023-01-10 LAB — TSH: TSH: 4.027 u[IU]/mL (ref 0.350–4.500)

## 2023-01-10 LAB — GLUCOSE, CAPILLARY
Glucose-Capillary: 105 mg/dL — ABNORMAL HIGH (ref 70–99)
Glucose-Capillary: 111 mg/dL — ABNORMAL HIGH (ref 70–99)
Glucose-Capillary: 115 mg/dL — ABNORMAL HIGH (ref 70–99)

## 2023-01-10 LAB — IRON AND TIBC
Iron: 29 ug/dL — ABNORMAL LOW (ref 45–182)
Saturation Ratios: 11 % — ABNORMAL LOW (ref 17.9–39.5)
TIBC: 255 ug/dL (ref 250–450)
UIBC: 226 ug/dL

## 2023-01-10 LAB — RETICULOCYTES
Immature Retic Fract: 19.6 % — ABNORMAL HIGH (ref 2.3–15.9)
RBC.: 3.47 MIL/uL — ABNORMAL LOW (ref 4.22–5.81)
Retic Count, Absolute: 76.7 10*3/uL (ref 19.0–186.0)
Retic Ct Pct: 2.2 % (ref 0.4–3.1)

## 2023-01-10 LAB — CBC
HCT: 32.8 % — ABNORMAL LOW (ref 39.0–52.0)
Hemoglobin: 10.5 g/dL — ABNORMAL LOW (ref 13.0–17.0)
MCH: 31.4 pg (ref 26.0–34.0)
MCHC: 32 g/dL (ref 30.0–36.0)
MCV: 98.2 fL (ref 80.0–100.0)
Platelets: 130 10*3/uL — ABNORMAL LOW (ref 150–400)
RBC: 3.34 MIL/uL — ABNORMAL LOW (ref 4.22–5.81)
RDW: 15.6 % — ABNORMAL HIGH (ref 11.5–15.5)
WBC: 6 10*3/uL (ref 4.0–10.5)
nRBC: 0 % (ref 0.0–0.2)

## 2023-01-10 LAB — TYPE AND SCREEN
ABO/RH(D): O POS
Antibody Screen: NEGATIVE

## 2023-01-10 LAB — FOLATE: Folate: 35.6 ng/mL (ref >5.9)

## 2023-01-10 LAB — VITAMIN D 25 HYDROXY (VIT D DEFICIENCY, FRACTURES): Vit D, 25-Hydroxy: 45.9 ng/mL (ref 30–100)

## 2023-01-10 LAB — FERRITIN: Ferritin: 920 ng/mL — ABNORMAL HIGH (ref 24–336)

## 2023-01-10 LAB — VITAMIN B12: Vitamin B-12: 782 pg/mL (ref 180–914)

## 2023-01-10 LAB — HEPATITIS B SURFACE ANTIGEN: Hepatitis B Surface Ag: NONREACTIVE

## 2023-01-10 LAB — PHOSPHORUS: Phosphorus: 3.4 mg/dL (ref 2.5–4.6)

## 2023-01-10 LAB — HEMOGLOBIN A1C
Hgb A1c MFr Bld: 6 % — ABNORMAL HIGH (ref 4.8–5.6)
Mean Plasma Glucose: 125.5 mg/dL

## 2023-01-10 LAB — MAGNESIUM: Magnesium: 1.9 mg/dL (ref 1.7–2.4)

## 2023-01-10 MED ORDER — METHOCARBAMOL 1000 MG/10ML IJ SOLN
500.0000 mg | Freq: Four times a day (QID) | INTRAVENOUS | Status: DC | PRN
  Filled 2023-01-10: qty 5

## 2023-01-10 MED ORDER — METHOCARBAMOL 500 MG PO TABS
500.0000 mg | ORAL_TABLET | Freq: Four times a day (QID) | ORAL | Status: DC | PRN
  Administered 2023-01-16: 500 mg via ORAL
  Filled 2023-01-10: qty 1

## 2023-01-10 MED ORDER — INSULIN ASPART 100 UNIT/ML IJ SOLN
0.0000 [IU] | INTRAMUSCULAR | Status: DC
  Administered 2023-01-11 – 2023-01-12 (×3): 1 [IU] via SUBCUTANEOUS
  Administered 2023-01-13: 2 [IU] via SUBCUTANEOUS
  Administered 2023-01-14: 3 [IU] via SUBCUTANEOUS
  Administered 2023-01-14 (×4): 2 [IU] via SUBCUTANEOUS
  Administered 2023-01-14: 3 [IU] via SUBCUTANEOUS
  Administered 2023-01-15 (×2): 1 [IU] via SUBCUTANEOUS
  Administered 2023-01-15 (×2): 2 [IU] via SUBCUTANEOUS
  Administered 2023-01-15 (×2): 1 [IU] via SUBCUTANEOUS
  Administered 2023-01-16: 3 [IU] via SUBCUTANEOUS
  Administered 2023-01-16: 2 [IU] via SUBCUTANEOUS

## 2023-01-10 MED ORDER — SOTALOL HCL 80 MG PO TABS
80.0000 mg | ORAL_TABLET | Freq: Two times a day (BID) | ORAL | Status: DC
  Administered 2023-01-10 – 2023-01-13 (×4): 80 mg via ORAL
  Filled 2023-01-10 (×7): qty 1

## 2023-01-10 MED ORDER — MIDODRINE HCL 5 MG PO TABS
ORAL_TABLET | ORAL | Status: AC
  Filled 2023-01-10: qty 2

## 2023-01-10 MED ORDER — LEVOTHYROXINE SODIUM 88 MCG PO TABS
88.0000 ug | ORAL_TABLET | Freq: Every day | ORAL | Status: DC
  Administered 2023-01-11 – 2023-01-17 (×7): 88 ug via ORAL
  Filled 2023-01-10 (×7): qty 1

## 2023-01-10 MED ORDER — HYDROCODONE-ACETAMINOPHEN 5-325 MG PO TABS
1.0000 | ORAL_TABLET | Freq: Four times a day (QID) | ORAL | Status: DC | PRN
  Administered 2023-01-10 – 2023-01-13 (×3): 1 via ORAL
  Administered 2023-01-14 – 2023-01-15 (×2): 2 via ORAL
  Administered 2023-01-17: 1 via ORAL
  Filled 2023-01-10: qty 1
  Filled 2023-01-10: qty 2
  Filled 2023-01-10 (×2): qty 1
  Filled 2023-01-10 (×2): qty 2

## 2023-01-10 MED ORDER — MIDODRINE HCL 5 MG PO TABS
5.0000 mg | ORAL_TABLET | Freq: Three times a day (TID) | ORAL | Status: DC
  Administered 2023-01-11 – 2023-01-17 (×14): 5 mg via ORAL
  Filled 2023-01-10 (×17): qty 1

## 2023-01-10 MED ORDER — MORPHINE SULFATE (PF) 2 MG/ML IV SOLN: 0.5000 mg | INTRAVENOUS | Status: DC | PRN

## 2023-01-10 MED ORDER — ZOLPIDEM TARTRATE 5 MG PO TABS
5.0000 mg | ORAL_TABLET | Freq: Every day | ORAL | Status: DC
  Administered 2023-01-10 – 2023-01-16 (×6): 5 mg via ORAL
  Filled 2023-01-10 (×6): qty 1

## 2023-01-10 NOTE — Anesthesia Preprocedure Evaluation (Signed)
Anesthesia Evaluation    Reviewed: Allergy & Precautions, Patient's Chart, lab work & pertinent test results  Airway        Dental   Pulmonary neg pulmonary ROS, former smoker          Cardiovascular hypertension, Pt. on medications + Past MI, + CABG (2001), +CHF (LVEF 40-45%) and + DOE  + dysrhythmias Atrial Fibrillation + Valvular Problems/Murmurs (mod AI) AI   Echo 2019 - Left ventricle: The cavity size was moderately dilated. The    estimated ejection fraction was 45%. Diffuse hypokinesis.  - Aortic valve: There was moderate regurgitation.  - Right ventricle: The cavity size was moderately dilated.  - Atrial septum: No defect or patent foramen ovale was identified.  - Tricuspid valve: There was moderate regurgitation.     Neuro/Psych negative neurological ROS     GI/Hepatic Neg liver ROS,GERD  ,,  Endo/Other  diabetes, Well Controlled, Type 2Hypothyroidism    Renal/GU ESRFRenal disease     Musculoskeletal negative musculoskeletal ROS (+)    Abdominal   Peds  Hematology negative hematology ROS (+)   Anesthesia Other Findings   Reproductive/Obstetrics                              Anesthesia Physical Anesthesia Plan  ASA:   Anesthesia Plan:    Post-op Pain Management:    Induction:   PONV Risk Score and Plan:   Airway Management Planned:   Additional Equipment:   Intra-op Plan:   Post-operative Plan:   Informed Consent:    Patient has DNR.     Plan Discussed with:   Anesthesia Plan Comments:          Anesthesia Quick Evaluation

## 2023-01-10 NOTE — Assessment & Plan Note (Signed)
Patient no longer on statin.  Will hold Plavix given need for surgical intervention

## 2023-01-10 NOTE — Assessment & Plan Note (Signed)
Anesthesiology should be aware of his diagnosis for the purposes of surgical intervention

## 2023-01-10 NOTE — Assessment & Plan Note (Signed)
Currently is to be euvolemic will avoid over aggressive fluid resuscitation hold Lasix for tonight given soft blood pressures and recent dialysis Repeat echogram

## 2023-01-10 NOTE — Assessment & Plan Note (Signed)
Continue midodrine when able to tolerate

## 2023-01-10 NOTE — Subjective & Objective (Signed)
Patient was initially admitted at [facility] where he presented with right hip pain He originally had right hip surgery and intramedullary rod rod and neck screw placed after a fall was residing at a SNF but developed worsening pain repeat imaging showed signs of loosening screws and orthopedic surgeon recommended patient only does 30% weightbearing Despite patient following instructions he still has severe pain Plan was for patient to be transferred to [facility] under care of Dr. Carola Frost for possible revision Patient is on hemodialysis Monday Wednesday Friday and has received hemodialysis on 19 August

## 2023-01-10 NOTE — Progress Notes (Addendum)
PT Cancellation Note  Patient Details Name: Matthew [name] MRN: [id number] DOB: 12-08-1930   Cancelled Treatment:     Pt currently off floor in HD.Per ortho, planned transfer to [facility] hospital for surgery. Will continue to follow and progress if unable to transfer this date.    Rushie Chestnut 01/10/2023, 9:41 AM

## 2023-01-10 NOTE — Progress Notes (Signed)
PROGRESS NOTE    Matthew [name]  SWF:[id number] DOB: 1930-07-21 DOA: 01/08/2023 PCP: Sherron Monday, MD  Outpatient Specialists:    Brief Narrative:   Matthew [name] is a 89 y.o. male with medical history significant of osteoporosis, recent right hip fracture status post intramedullary rod and femoral neck screw, ESRD on HD MWF, chronic orthostatic hypotension on as needed midodrine, PAF not on anticoagulation due to fall risk, IIDM, presented with worsening right hip pain.   Patient recently underwent right hip surgery with intramedullary rod and neck screw after a fall and fracture of right hip and went to local SNF for rehab.  SNF stay was prolonged due to COVID infection was treated with Paxlovid.  Patient was discharged home from SNF 2 weeks ago but's ambulation significant deteriorated compared to when he was in the nursing home.  No significant fall.  This week patient has had significant difficulty to walk up stairs and PT sent him to see orthopedic surgery on Tuesday.  X-ray showed concerning signs for loosening of the screws and orthopedic surgeon recommended patient only does 30% weightbearing on right leg with the aid of a rolling walker.  Patient followed the instruction however the pain became unbearable and even standing up caused significant pain for the last couple of days.  Patient is ESRD on HD and went regular dialysis yesterday.   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 Carola Frost will attempt revision. Surgery scheduled for this PM but now canceled, re-scheduled likely for tomorrow. D/c orders are in.  # ESRD on hemodialysis Mwf hemodialysis, received today w/o incident  # CAD # Hx CABG Asymptomatic. Reports intolerance to statins - plavix on hold pending surgery  # HFmrEF No dyspnea - cont home lasix  # T2DM Glucose appropriate - holding home glipizide - monitor fasting sugars for now  # Hypothyroid - cont home levothyroxine  # Insomnia - home suvorexant  # A-fib Not anticoagulated - cont home sotalol   DVT prophylaxis: heparin Code Status: dnr Family Communication: daughter updated @ bedside 8/19  Level of care: Med-Surg Status is: Inpatient Remains inpatient appropriate because: severity of illness    Consultants:  ortho  Procedures: pending  Antimicrobials:  none    Subjective: Tolerating dialysis no complaints  Objective: Vitals:   01/10/23 1130 01/10/23 1200 01/10/23 1257 01/10/23 1431  BP: (!) 107/55 (!) 127/57 124/61 (!) 110/56  Pulse: 81 92 79 77  Resp: 13 13 14 14   Temp:   97.8 F (36.6 C) 98.3 F (36.8 C)  TempSrc:   Oral   SpO2: 99% 99% 96% 98%  Weight:   91 kg   Height:        Intake/Output Summary (Last 24 hours) at 01/10/2023 1436 Last data filed at 01/10/2023 1257 Gross per 24 hour  Intake --  Output 1775 ml  Net -1775 ml   Filed Weights   01/08/23 1401 01/10/23 0844 01/10/23 1257  Weight: 91.2 kg 92.5 kg 91 kg  Examination:  General exam: Appears calm and comfortable  Respiratory system: Clear to auscultation. Respiratory effort normal. Cardiovascular system: S1 & S2 heard, RRR. No JVD, murmurs, rubs, gallops or clicks.   Gastrointestinal system: Abdomen is nondistended, soft and nontender.  Central nervous system: Alert and oriented. No focal neurological deficits. Extremities: warm Mild LE edema Skin: No rashes, lesions or ulcers Psychiatry: Judgement and insight  appear normal. Mood & affect appropriate.     Data Reviewed: I have personally reviewed following labs and imaging studies  CBC: Recent Labs  Lab 01/08/23 1358 01/10/23 0929  WBC 6.0 6.0  NEUTROABS 4.2  --   HGB 11.2* 10.5*  HCT 35.5* 32.8*  MCV 99.7 98.2  PLT 131* 130*   Basic Metabolic Panel: Recent Labs  Lab 01/08/23 1358 01/10/23 0408  NA 134* 134*  K 4.4 4.7  CL 94* 94*  CO2 28 28  GLUCOSE 154* 116*  BUN 65* 85*  CREATININE 2.93* 3.76*  CALCIUM 8.7* 8.8*   GFR: Estimated Creatinine Clearance: 14.8 mL/min (A) (by C-G formula based on SCr of 3.76 mg/dL (H)). Liver Function Tests: No results for input(s): "AST", "ALT", "ALKPHOS", "BILITOT", "PROT", "ALBUMIN" in the last 168 hours. No results for input(s): "LIPASE", "AMYLASE" in the last 168 hours. No results for input(s): "AMMONIA" in the last 168 hours. Coagulation Profile: No results for input(s): "INR", "PROTIME" in the last 168 hours. Cardiac Enzymes: No results for input(s): "CKTOTAL", "CKMB", "CKMBINDEX", "TROPONINI" in the last 168 hours. BNP (last 3 results) No results for input(s): "PROBNP" in the last 8760 hours. HbA1C: No results for input(s): "HGBA1C" in the last 72 hours. CBG: Recent Labs  Lab 01/09/23 0744 01/09/23 1126 01/09/23 1751 01/09/23 2122 01/10/23 0814  GLUCAP 91 146* 144* 131* 105*   Lipid Profile: No results for input(s): "CHOL", "HDL", "LDLCALC", "TRIG", "CHOLHDL", "LDLDIRECT" in the last 72 hours. Thyroid Function Tests: No results for input(s): "TSH", "T4TOTAL", "FREET4", "T3FREE", "THYROIDAB" in the last 72 hours. Anemia Panel: No results for input(s): "VITAMINB12", "FOLATE", "FERRITIN", "TIBC", "IRON", "RETICCTPCT" in the last 72 hours. Urine analysis:    Component Value Date/Time   COLORURINE YELLOW (A) 05/24/2019 0021   APPEARANCEUR CLEAR (A) 05/24/2019 0021   LABSPEC 1.012 05/24/2019 0021   PHURINE 6.0 05/24/2019 0021   GLUCOSEU NEGATIVE 05/24/2019 0021   HGBUR  NEGATIVE 05/24/2019 0021   BILIRUBINUR Negative 10/15/2022 1202   KETONESUR NEGATIVE 05/24/2019 0021   PROTEINUR Positive (A) 10/15/2022 1202   PROTEINUR 30 (A) 05/24/2019 0021   UROBILINOGEN 0.2 10/15/2022 1202   NITRITE Negative 10/15/2022 1202   NITRITE NEGATIVE 05/24/2019 0021   LEUKOCYTESUR Large (3+) (A) 10/15/2022 1202   LEUKOCYTESUR NEGATIVE 05/24/2019 0021   Sepsis Labs: @LABRCNTIP (procalcitonin:4,lacticidven:4)  )No results found for this or any previous visit (from the past 240 hour(s)).       Radiology Studies: CT HIP RIGHT WO CONTRAST  Result Date: 01/10/2023 CLINICAL DATA:  Hip replacement, loosening suspected Hip surgical planning EXAM: CT OF THE RIGHT HIP WITHOUT CONTRAST TECHNIQUE: Multidetector CT imaging of the right hip was performed according to the standard protocol. Multiplanar CT image reconstructions were also generated. RADIATION DOSE REDUCTION: This exam was performed according to the departmental dose-optimization program which includes automated exposure control, adjustment of the mA and/or kV according to patient size and/or use of iterative reconstruction technique. COMPARISON:  X-ray 01/08/2023, 11/12/2022 FINDINGS: Bones/Joint/Cartilage Postsurgical changes from right hip ORIF associated with a intertrochanteric fracture of the proximal right femur. Fracture lines remain visible. There  is mild periosteal new bone formation at the fracture location. Femoral neck lag screw has migrated within the femoral head and the distal tip is now positioned intra-articularly extending 2 mm beyond the femoral head cortex. No definite new fracture. Hip joint alignment is maintained without dislocation. Mild to moderate right hip joint osteoarthritis. Old healed fractures of the right superior and inferior pubic rami. Ligaments Suboptimally assessed by CT. Muscles and Tendons No acute musculotendinous abnormality by CT. Soft tissues Nonspecific soft tissue swelling/edema over  the lateral aspect of the right hip and proximal thigh. No organized fluid collections. Atherosclerotic vascular calcification. No right inguinal lymphadenopathy. IMPRESSION: 1. Postsurgical changes from right hip ORIF associated with a intertrochanteric fracture of the proximal right femur. Fracture lines remain visible. There is mild periosteal new bone formation at the fracture location. 2. Femoral neck lag screw has migrated within the femoral head and the distal tip is now positioned intra-articularly. 3. Mild to moderate right hip joint osteoarthritis. 4. Old healed fractures of the right superior and inferior pubic rami. 5. Nonspecific soft tissue swelling/edema over the lateral aspect of the right hip and proximal thigh. No organized fluid collections. Electronically Signed   By: Duanne Guess D.O.   On: 01/10/2023 11:44   DG Femur Portable Min 2 Views Right  Result Date: 01/08/2023 CLINICAL DATA:  Right hip surgery in June with worsening pain in the right hip over the past week. EXAM: RIGHT FEMUR PORTABLE 2 VIEW COMPARISON:  Right hip radiographs dated 11/12/2022. FINDINGS: The patient is status post placement of a femoral shaft intramedullary nail and femoral neck screw on the right. Compared to 11/12/2022, the femoral neck screw has advanced with the tip now extending slightly beyond the right femoral head and into the right hip joint space. No new fracture is identified, however there is suggestion of compression of the intertrochanteric fracture of the right femur compared to prior exam. A displaced fracture fragment of the right lesser trochanter is redemonstrated. IMPRESSION: Suggestion of compression of the intertrochanteric fracture of the right femur compared to prior exam as well as interval advancement of the femoral neck screw with the tip now extending slightly beyond the right femoral head and into the right hip joint space. Electronically Signed   By: Romona Curls M.D.   On:  01/08/2023 15:01   DG Hip Unilat W or Wo Pelvis 2-3 Views Right  Result Date: 01/08/2023 CLINICAL DATA:  Right hip surgery in June with worsening pain in the right hip over the past week. EXAM: DG HIP (WITH OR WITHOUT PELVIS) 2-3V RIGHT COMPARISON:  Right hip radiographs dated 11/12/2022. FINDINGS: The patient is status post placement of a femoral shaft intramedullary nail and femoral neck screw on the right. Compared to 11/12/2022, the femoral neck screw has advanced with the tip now extending slightly beyond the right femoral head and into the right hip joint space. No new fracture is identified, however there is suggestion of compression of the intertrochanteric fracture of the right femur compared to prior exam. A displaced fracture fragment of the right lesser trochanter is redemonstrated. IMPRESSION: Suggestion of compression of the intertrochanteric fracture of the right femur compared to prior exam as well as interval advancement of the femoral neck screw with the tip now extending slightly beyond the right femoral head and into the right hip joint space. Electronically Signed   By: Romona Curls M.D.   On: 01/08/2023 15:00        Scheduled Meds:  cholecalciferol  1,000 Units Oral Q breakfast   feeding supplement  237 mL Oral BID BM   furosemide  40 mg Oral Daily   levothyroxine  88 mcg Oral QAC breakfast   loratadine  10 mg Oral Daily   midodrine  10 mg Oral Q M,W,F-HD   multivitamin  1 tablet Oral QHS   polyethylene glycol  17 g Oral Daily   senna-docusate  2 tablet Oral BID   sotalol  80 mg Oral BID   Continuous Infusions:  sodium chloride       LOS: 2 days     Silvano Bilis, MD [facility]   If 7PM-7AM, please contact night-coverage [email or web address] Password [facility] 01/10/2023, 2:36 PM

## 2023-01-10 NOTE — TOC Progression Note (Signed)
Transition of Care [facility]) - Progression Note    Patient Details  Name: Matthew [name] MRN: [id number] Date of Birth: [birth year]  Transition of Care [facility]) CM/SW Contact  Margarito Liner, LCSW Phone Number: 01/10/2023, 8:50 AM  Clinical Narrative:   No bed offers this morning. Sent therapy notes to try and trigger responses.  Expected Discharge Plan: Skilled Nursing Facility Barriers to Discharge: Continued Medical Work up  Expected Discharge Plan and Services       Living arrangements for the past 2 months: Single Family Home                                       Social Determinants of Health (SDOH) Interventions SDOH Screenings   Food Insecurity: No Food Insecurity (11/12/2022)  Housing: Low Risk  (11/12/2022)  Transportation Needs: No Transportation Needs (11/12/2022)  Utilities: Not At Risk (11/12/2022)  Depression (PHQ2-9): Low Risk  (09/21/2022)  Financial Resource Strain: Low Risk  (01/10/2018)  Physical Activity: Inactive (01/10/2018)  Social Connections: Socially Integrated (01/10/2018)  Stress: No Stress Concern Present (01/10/2018)  Tobacco Use: Medium Risk (01/08/2023)    Readmission Risk Interventions    01/09/2023    4:42 PM 02/12/2021    3:28 PM  Readmission Risk Prevention Plan  Transportation Screening Complete Complete  PCP or Specialist Appt within 3-5 Days Complete   HRI or Home Care Consult Complete   Social Work Consult for Recovery Care Planning/Counseling Complete   Palliative Care Screening Not Applicable   Medication Review [employer]) Complete Complete  SW Recovery Care/Counseling Consult  Complete  Skilled Nursing Facility  Complete

## 2023-01-10 NOTE — Assessment & Plan Note (Signed)
Continue sotalol no longer on any anticoagulation appreciate cardiology involvement

## 2023-01-10 NOTE — Assessment & Plan Note (Signed)
Obtain echogram is no echogram has been done in our system since 2017 Patient have had minimal ambulation for the past 1 month Appreciate cardiology involvement for preop clearance

## 2023-01-10 NOTE — Plan of Care (Signed)
  Problem: Activity: Goal: Risk for activity intolerance will decrease Outcome: Progressing   Problem: Coping: Goal: Level of anxiety will decrease Outcome: Progressing   Problem: Elimination: Goal: Will not experience complications related to bowel motility Outcome: Progressing Goal: Will not experience complications related to urinary retention Outcome: Progressing   Problem: Pain Managment: Goal: General experience of comfort will improve Outcome: Progressing   

## 2023-01-10 NOTE — Progress Notes (Signed)
Hemodialysis note  Received patient in bed to unit. Alert and oriented.  Informed consent signed and in chart.  Treatment initiated: 0919 Treatment completed: 1257  Patient tolerated well. Transported back to room, alert without acute distress.  Report given to patient's RN.   Access used: LUA AVG Access issues: none  Total UF removed: 1.5 L Medication(s) given:  Midodrine 10 mg tab PO Post HD weight: 91 kg   Matthew [name] [facility]

## 2023-01-10 NOTE — Consult Note (Signed)
[facility] Customer service manager [facility]) Accountable Care Organization (ACO) [facility] Liaison Note  01/10/2023  [name] Sep 16, 1930 [id number]  Location: [facility] RN Hospital Liaison screened the patient remotely at [facility].  Insurance:  Medicare   Matthew [name] is a 89 y.o. male who is a Primary Care Patient of Tejan-Sie, Marcelino Freestone, MD. The patient was screened for  readmission hospitalization with noted medium risk score for unplanned readmission risk with 2 IP in 6 months.  The patient was assessed for potential [facility]) Care Management service needs for post hospital transition for care coordination. Review of patient's electronic medical record reveals patient currently in HD pending placement for SNF level of care. Will alert PAC-RN if pt is discharged to an affiliated facility followed by [name].   Plan: [facility] Liaison will continue to follow progress and disposition to asess for post hospital community care coordination/management needs.  Referral request for community care coordination: pending disposition.   [facility] Care Management/Population Health does not replace or interfere with any arrangements made by the Inpatient Transition of Care team.   For questions contact:   Elliot Cousin, RN, [facility] Liaison [place]   Population Health Office Hours MTWF  8:00 am-6:00 pm Off on Thursday [phone] mobile [phone] [Office toll free line] Office Hours are M-F 8:30 - 5 pm [name]

## 2023-01-10 NOTE — Assessment & Plan Note (Addendum)
-   management as per orthopedics,  plan to operate  tomorrow pm   Keep nothing by mouth post midnight. Patient antiplatelet agents  on hold Ordered type and screen , order a vitamin D level  Patient at baseline unable to walk a flight of stairs or 100 feet due to pain     Patient denies any chest pain or shortness of breath currently and/or with exertion,   ECG showing no evidence of acute ischemia   known history of coronary artery disease,ESRD systolic CHF and aortic dissection  Given advanced age patient is at least moderate  risk which has been discussed with family  further cardiac workup is indicated.   will order echo     Cardiology consult for further pre-op clearance and help w management given extensive cardiac disease

## 2023-01-10 NOTE — Progress Notes (Addendum)
[facility]  ROUNDING NOTE   Subjective:  Patient well-known to [place] as we follow him for outpatient hemodialysis treatments. Did undergo full treatment on Monday. Patient had recent right hip fracture and was status post intramedullary rod and femoral neck screw. He subsequently went to SNF and developed COVID-19 infection. X-ray now shows loosening of screws in the right hip.  Update Patient seen and evaluated during dialysis   HEMODIALYSIS FLOWSHEET:  Blood Flow Rate (mL/min): 399 mL/min Arterial Pressure (mmHg): -255.75 mmHg Venous Pressure (mmHg): 268.47 mmHg TMP (mmHg): -3.84 mmHg Ultrafiltration Rate (mL/min): 686 mL/min Dialysate Flow Rate (mL/min): 299 ml/min  Denies pain or discomfort Remains on room air   Objective:  Vital signs in last 24 hours:  Temp:  [97.6 F (36.4 C)-98.1 F (36.7 C)] 97.9 F (36.6 C) (08/19 0844) Pulse Rate:  [66-89] 80 (08/19 1100) Resp:  [12-20] 14 (08/19 1100) BP: (102-121)/(48-71) 108/48 (08/19 1100) SpO2:  [94 %-99 %] 94 % (08/19 1100) Weight:  [92.5 kg] 92.5 kg (08/19 0844)  Weight change:  Filed Weights   01/08/23 1401 01/10/23 0844  Weight: 91.2 kg 92.5 kg    Intake/Output: I/O last 3 completed shifts: In: 960 [P.O.:960] Out: 400 [Urine:400]   Intake/Output this shift:  No intake/output data recorded.  Physical Exam: General: No acute distress  Head: Normocephalic, atraumatic. Moist oral mucosal membranes  Lungs:  Clear to auscultation, normal effort  Heart: S1S2 no rubs  Abdomen:  Soft, nontender, bowel sounds present  Extremities: Trace peripheral edema.  Neurologic: Awake, alert, following commands  Skin: No acute rash  Access: Left upper extremity AV graft    Basic Metabolic Panel: Recent Labs  Lab 01/08/23 1358 01/10/23 0408  NA 134* 134*  K 4.4 4.7  CL 94* 94*  CO2 28 28  GLUCOSE 154* 116*  BUN 65* 85*  CREATININE 2.93* 3.76*  CALCIUM 8.7* 8.8*    Liver Function Tests: No  results for input(s): "AST", "ALT", "ALKPHOS", "BILITOT", "PROT", "ALBUMIN" in the last 168 hours. No results for input(s): "LIPASE", "AMYLASE" in the last 168 hours. No results for input(s): "AMMONIA" in the last 168 hours.  CBC: Recent Labs  Lab 01/08/23 1358 01/10/23 0929  WBC 6.0 6.0  NEUTROABS 4.2  --   HGB 11.2* 10.5*  HCT 35.5* 32.8*  MCV 99.7 98.2  PLT 131* 130*    Cardiac Enzymes: No results for input(s): "CKTOTAL", "CKMB", "CKMBINDEX", "TROPONINI" in the last 168 hours.  BNP: Invalid input(s): "POCBNP"  CBG: Recent Labs  Lab 01/09/23 0744 01/09/23 1126 01/09/23 1751 01/09/23 2122 01/10/23 0814  GLUCAP 91 146* 144* 131* 105*    Microbiology: Results for orders placed or performed in visit on 10/15/22  Culture, Urine     Status: Abnormal   Collection Time: 10/15/22  2:05 PM   Specimen: Urine   UC  Result Value Ref Range Status   Urine Culture, Routine Final report (A)  Final   Organism ID, Bacteria Escherichia coli (A)  Final    Comment: Cefazolin <=4 ug/mL Cefazolin with an MIC <=16 predicts susceptibility to the oral agents cefaclor, cefdinir, cefpodoxime, cefprozil, cefuroxime, cephalexin, and loracarbef when used for therapy of uncomplicated urinary tract infections due to E. coli, Klebsiella pneumoniae, and Proteus mirabilis. Greater than 100,000 colony forming units per mL    Antimicrobial Susceptibility Comment  Final    Comment:       ** S = Susceptible; I = Intermediate; R = Resistant **  P = Positive; N = Negative             MICS are expressed in micrograms per mL    Antibiotic                 RSLT#1    RSLT#2    RSLT#3    RSLT#4 Amoxicillin/Clavulanic Acid    S Ampicillin                     S Cefepime                       S Ceftriaxone                    S Cefuroxime                     S Ciprofloxacin                  S Ertapenem                      S Gentamicin                     S Imipenem                        S Levofloxacin                   S Meropenem                      S Nitrofurantoin                 S Piperacillin/Tazobactam        S Tetracycline                   S Tobramycin                     S Trimethoprim/Sulfa             S     Coagulation Studies: No results for input(s): "LABPROT", "INR" in the last 72 hours.  Urinalysis: No results for input(s): "COLORURINE", "LABSPEC", "PHURINE", "GLUCOSEU", "HGBUR", "BILIRUBINUR", "KETONESUR", "PROTEINUR", "UROBILINOGEN", "NITRITE", "LEUKOCYTESUR" in the last 72 hours.  Invalid input(s): "APPERANCEUR"    Imaging: DG Femur Portable Min 2 Views Right  Result Date: 01/08/2023 CLINICAL DATA:  Right hip surgery in June with worsening pain in the right hip over the past week. EXAM: RIGHT FEMUR PORTABLE 2 VIEW COMPARISON:  Right hip radiographs dated 11/12/2022. FINDINGS: The patient is status post placement of a femoral shaft intramedullary nail and femoral neck screw on the right. Compared to 11/12/2022, the femoral neck screw has advanced with the tip now extending slightly beyond the right femoral head and into the right hip joint space. No new fracture is identified, however there is suggestion of compression of the intertrochanteric fracture of the right femur compared to prior exam. A displaced fracture fragment of the right lesser trochanter is redemonstrated. IMPRESSION: Suggestion of compression of the intertrochanteric fracture of the right femur compared to prior exam as well as interval advancement of the femoral neck screw with the tip now extending slightly beyond the right femoral head and into the right hip joint space. Electronically Signed   By: Romona Curls M.D.   On: 01/08/2023 15:01   DG  Hip Unilat W or Wo Pelvis 2-3 Views Right  Result Date: 01/08/2023 CLINICAL DATA:  Right hip surgery in June with worsening pain in the right hip over the past week. EXAM: DG HIP (WITH OR WITHOUT PELVIS) 2-3V RIGHT COMPARISON:  Right hip  radiographs dated 11/12/2022. FINDINGS: The patient is status post placement of a femoral shaft intramedullary nail and femoral neck screw on the right. Compared to 11/12/2022, the femoral neck screw has advanced with the tip now extending slightly beyond the right femoral head and into the right hip joint space. No new fracture is identified, however there is suggestion of compression of the intertrochanteric fracture of the right femur compared to prior exam. A displaced fracture fragment of the right lesser trochanter is redemonstrated. IMPRESSION: Suggestion of compression of the intertrochanteric fracture of the right femur compared to prior exam as well as interval advancement of the femoral neck screw with the tip now extending slightly beyond the right femoral head and into the right hip joint space. Electronically Signed   By: Romona Curls M.D.   On: 01/08/2023 15:00     Medications:    sodium chloride      cholecalciferol  1,000 Units Oral Q breakfast   feeding supplement  237 mL Oral BID BM   furosemide  40 mg Oral Daily   levothyroxine  88 mcg Oral QAC breakfast   loratadine  10 mg Oral Daily   midodrine  10 mg Oral Q M,W,F-HD   multivitamin  1 tablet Oral QHS   polyethylene glycol  17 g Oral Daily   senna-docusate  2 tablet Oral BID   sotalol  80 mg Oral BID   bisacodyl, fluticasone, HYDROcodone-acetaminophen, HYDROmorphone (DILAUDID) injection, midodrine, zolpidem  Assessment/ Plan:  89 y.o. male with past medical history of ESRD on HD MWF, right hip fracture status post intramedullary rod and femoral neck screw, anemia of chronic kidney disease, secondary hyperparathyroidism, chronic orthostatic hypotension, atrial fibrillation not on anticoagulation due to fall risk, diabetes mellitus type 2 who presented with worsening right hip pain and found to have loosening of screw in the hip.  1.  ESRD on HD MWF.  Receiving dialysis treatment today, UF goal 1.5L as tolerated. Next  treatment scheduled for Wednesday.   2.  Hypotension.  Blood pressure 112/53 during dialysis. Patient has received Midodrine 10mg  prior to treatment.   3.  Anemia chronic kidney disease.  Patient receives Mircera as outpatient.  Hemoglobin 10.5, at goal.   4.  Secondary hyperparathyroidism.  Monitor bone metabolism parameters over the course of hospitalization.  Calcium within desired range.   5.  Recent right hip fracture status post intramedullary rod and femoral neck screw.  It appears that the screw has loosened and possibly displaced.  Awaiting transfer to [facility] for surgical revision. .   LOS: 2   8/19/202411:13 AM

## 2023-01-10 NOTE — Progress Notes (Signed)
Admitted to [facility] from [facility] by [facility]. Denies pain/nausea at this time. Daughter at bedside Bed in low position, bed alarm on,call light within reach.

## 2023-01-10 NOTE — Assessment & Plan Note (Signed)
-   Check TSH continue home medications Synthroid at 88mcg po q day  

## 2023-01-10 NOTE — Assessment & Plan Note (Signed)
Continue midodrine Next hemodialysis on Wednesday Will need to let nephrology know patient has been admitted

## 2023-01-10 NOTE — Discharge Summary (Addendum)
Matthew [name]:[id number] DOB: 1930-05-31 DOA: 01/08/2023  PCP: Sherron Monday, MD  Admit date: 01/08/2023 Discharge date: 01/10/2023  Time spent: 35 minutes     Discharge Diagnoses:  Principal Problem:   Hip fracture [facility]) Active Problems:   Closed hip fracture requiring operative repair, right, sequela   End-stage renal disease on hemodialysis (HCC)   Atrial fibrillation (HCC)   CAD (coronary artery disease)   HFrEF (heart failure with reduced ejection fraction) (HCC)   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 [facility])   Age-related osteoporosis with current pathological fracture with routine healing   Discharge Condition: stable  Diet recommendation: low sodium  Filed Weights   01/08/23 1401 01/10/23 0844  Weight: 91.2 kg 92.5 kg    History of present illness:  From admission h and p Matthew [name] is a 89 y.o. male with medical history significant of osteoporosis, recent right hip fracture status post intramedullary rod and femoral neck screw, ESRD on HD MWF, chronic orthostatic hypotension on as needed midodrine, PAF not on anticoagulation due to fall risk, IIDM, presented with worsening right hip pain.   Patient recently underwent right hip surgery with intramedullary rod and neck screw after a fall and fracture of right hip and went to local SNF for rehab.  SNF stay was prolonged due to COVID infection was treated with Paxlovid.  Patient was discharged home from SNF 2 weeks ago but's ambulation significant deteriorated compared to when he was in the nursing home.  No significant fall.  This week patient has had significant difficulty to walk up stairs and PT sent him to see orthopedic surgery on Tuesday.  X-ray showed concerning signs for loosening of the screws and orthopedic surgeon recommended patient only does 30% weightbearing on right leg with the aid of a rolling walker.  Patient followed the instruction however the pain became unbearable and even standing  up caused significant pain for the last couple of days.  Patient is ESRD on HD and went regular dialysis yesterday.  Hospital Course:  Recent right hip fracture presenting with worsening hip pain and inability to bear weight on that leg. Imaging here shows migration of the femoral nail. Ortho consulted, plan is transfer to [facility] where Dr. Myrene Galas is planning on surgical revision. Received hemodialysis the morning of 8/19, next due 8/21. Other medical problems stable. Please notify Dr. Carola Frost upon patient's arrival.  Procedures: hemodialysis   Consultations: orthopedics  Discharge Exam: Vitals:   01/10/23 1130 01/10/23 1200  BP: (!) 107/55 (!) 127/57  Pulse: 81 92  Resp: 13 13  Temp:    SpO2: 99% 99%    General exam: Appears calm and comfortable  Respiratory system: Clear to auscultation. Respiratory effort normal. Cardiovascular system: S1 & S2 heard, RRR. No JVD, murmurs, rubs, gallops or clicks.   Gastrointestinal system: Abdomen is nondistended, soft and nontender.  Central nervous system: Alert and oriented. No focal neurological deficits. Extremities: warm Mild LE edema Skin: No rashes, lesions or ulcers Psychiatry: Judgement and insight appear normal. Mood & affect appropriate.   Discharge Instructions   Discharge Instructions     Diet - low sodium heart healthy   Complete by: As directed    Increase activity slowly   Complete by: As directed       Allergies as of 01/10/2023       Reactions   Other    Other reaction(s): Myalgias (intolerance), Other (See Comments) Statin Drugs  Statin Drugs  Amoxicillin Diarrhea        Medication List     STOP taking these medications    acyclovir ointment 5 % Commonly known as: ZOVIRAX       TAKE these medications    Belsomra 10 MG Tabs Generic drug: Suvorexant Take 10 mg by mouth at bedtime.   bisacodyl 10 MG suppository Commonly known as: DULCOLAX Place 1 suppository (10 mg total) rectally  daily as needed for moderate constipation.   cetirizine 10 MG tablet Commonly known as: ZYRTEC Take 10 mg by mouth daily.   cholecalciferol 25 MCG (1000 UNIT) tablet Commonly known as: VITAMIN D3 Take 1,000 Units by mouth daily with breakfast.   clopidogrel 75 MG tablet Commonly known as: PLAVIX Take 1 tablet (75 mg total) by mouth daily.   ferrous sulfate 325 (65 FE) MG tablet Take 325 mg by mouth 2 (two) times daily with a meal.   Fish Oil 1000 MG Caps Take 4,000 mg by mouth daily with lunch.   fluticasone 50 MCG/ACT nasal spray Commonly known as: FLONASE Place 2 sprays into both nostrils daily as needed for allergies or rhinitis.   furosemide 40 MG tablet Commonly known as: LASIX Take 40 mg by mouth.   glipiZIDE 5 MG tablet Commonly known as: GLUCOTROL Take 2.5 mg by mouth daily before breakfast.   levothyroxine 88 MCG tablet Commonly known as: SYNTHROID Take 88 mcg by mouth daily before breakfast. What changed: Another medication with the same name was removed. Continue taking this medication, and follow the directions you see here.   lidocaine-prilocaine cream Commonly known as: EMLA Apply 1 Application topically as needed (before dialysis).   midodrine 5 MG tablet Commonly known as: PROAMATINE Take 1 tablet (5 mg total) by mouth 3 (three) times daily with meals. What changed:  when to take this additional instructions   multivitamin Tabs tablet Take 1 tablet by mouth daily.   nitroGLYCERIN 0.4 MG SL tablet Commonly known as: NITROSTAT Place 0.4 mg under the tongue every 5 (five) minutes as needed for chest pain.   polyethylene glycol 17 g packet Commonly known as: MIRALAX / GLYCOLAX Take 17 g by mouth daily.   potassium chloride SA 20 MEQ tablet Commonly known as: KLOR-CON M Take 40 mEq by mouth daily.   rosuvastatin 40 MG tablet Commonly known as: CRESTOR TAKE 1 TABLET BY MOUTH ONCE  DAILY   senna-docusate 8.6-50 MG tablet Commonly known as:  Senokot-S Take 2 tablets by mouth 2 (two) times daily.   sotalol 80 MG tablet Commonly known as: Betapace Take 1 tablet (80 mg total) by mouth 2 (two) times daily.       Allergies  Allergen Reactions   Other     Other reaction(s): Myalgias (intolerance), Other (See Comments) Statin Drugs  Statin Drugs     Amoxicillin Diarrhea      The results of significant diagnostics from this hospitalization (including imaging, microbiology, ancillary and laboratory) are listed below for reference.    Significant Diagnostic Studies: CT HIP RIGHT WO CONTRAST  Result Date: 01/10/2023 CLINICAL DATA:  Hip replacement, loosening suspected Hip surgical planning EXAM: CT OF THE RIGHT HIP WITHOUT CONTRAST TECHNIQUE: Multidetector CT imaging of the right hip was performed according to the standard protocol. Multiplanar CT image reconstructions were also generated. RADIATION DOSE REDUCTION: This exam was performed according to the departmental dose-optimization program which includes automated exposure control, adjustment of the mA and/or kV according to patient size and/or use of iterative reconstruction technique.  COMPARISON:  X-ray 01/08/2023, 11/12/2022 FINDINGS: Bones/Joint/Cartilage Postsurgical changes from right hip ORIF associated with a intertrochanteric fracture of the proximal right femur. Fracture lines remain visible. There is mild periosteal new bone formation at the fracture location. Femoral neck lag screw has migrated within the femoral head and the distal tip is now positioned intra-articularly extending 2 mm beyond the femoral head cortex. No definite new fracture. Hip joint alignment is maintained without dislocation. Mild to moderate right hip joint osteoarthritis. Old healed fractures of the right superior and inferior pubic rami. Ligaments Suboptimally assessed by CT. Muscles and Tendons No acute musculotendinous abnormality by CT. Soft tissues Nonspecific soft tissue swelling/edema over  the lateral aspect of the right hip and proximal thigh. No organized fluid collections. Atherosclerotic vascular calcification. No right inguinal lymphadenopathy. IMPRESSION: 1. Postsurgical changes from right hip ORIF associated with a intertrochanteric fracture of the proximal right femur. Fracture lines remain visible. There is mild periosteal new bone formation at the fracture location. 2. Femoral neck lag screw has migrated within the femoral head and the distal tip is now positioned intra-articularly. 3. Mild to moderate right hip joint osteoarthritis. 4. Old healed fractures of the right superior and inferior pubic rami. 5. Nonspecific soft tissue swelling/edema over the lateral aspect of the right hip and proximal thigh. No organized fluid collections. Electronically Signed   By: Duanne Guess D.O.   On: 01/10/2023 11:44   DG Femur Portable Min 2 Views Right  Result Date: 01/08/2023 CLINICAL DATA:  Right hip surgery in June with worsening pain in the right hip over the past week. EXAM: RIGHT FEMUR PORTABLE 2 VIEW COMPARISON:  Right hip radiographs dated 11/12/2022. FINDINGS: The patient is status post placement of a femoral shaft intramedullary nail and femoral neck screw on the right. Compared to 11/12/2022, the femoral neck screw has advanced with the tip now extending slightly beyond the right femoral head and into the right hip joint space. No new fracture is identified, however there is suggestion of compression of the intertrochanteric fracture of the right femur compared to prior exam. A displaced fracture fragment of the right lesser trochanter is redemonstrated. IMPRESSION: Suggestion of compression of the intertrochanteric fracture of the right femur compared to prior exam as well as interval advancement of the femoral neck screw with the tip now extending slightly beyond the right femoral head and into the right hip joint space. Electronically Signed   By: Romona Curls M.D.   On:  01/08/2023 15:01   DG Hip Unilat W or Wo Pelvis 2-3 Views Right  Result Date: 01/08/2023 CLINICAL DATA:  Right hip surgery in June with worsening pain in the right hip over the past week. EXAM: DG HIP (WITH OR WITHOUT PELVIS) 2-3V RIGHT COMPARISON:  Right hip radiographs dated 11/12/2022. FINDINGS: The patient is status post placement of a femoral shaft intramedullary nail and femoral neck screw on the right. Compared to 11/12/2022, the femoral neck screw has advanced with the tip now extending slightly beyond the right femoral head and into the right hip joint space. No new fracture is identified, however there is suggestion of compression of the intertrochanteric fracture of the right femur compared to prior exam. A displaced fracture fragment of the right lesser trochanter is redemonstrated. IMPRESSION: Suggestion of compression of the intertrochanteric fracture of the right femur compared to prior exam as well as interval advancement of the femoral neck screw with the tip now extending slightly beyond the right femoral head and into the right  hip joint space. Electronically Signed   By: Romona Curls M.D.   On: 01/08/2023 15:00    Microbiology: No results found for this or any previous visit (from the past 240 hour(s)).   Labs: Basic Metabolic Panel: Recent Labs  Lab 01/08/23 1358 01/10/23 0408  NA 134* 134*  K 4.4 4.7  CL 94* 94*  CO2 28 28  GLUCOSE 154* 116*  BUN 65* 85*  CREATININE 2.93* 3.76*  CALCIUM 8.7* 8.8*   Liver Function Tests: No results for input(s): "AST", "ALT", "ALKPHOS", "BILITOT", "PROT", "ALBUMIN" in the last 168 hours. No results for input(s): "LIPASE", "AMYLASE" in the last 168 hours. No results for input(s): "AMMONIA" in the last 168 hours. CBC: Recent Labs  Lab 01/08/23 1358 01/10/23 0929  WBC 6.0 6.0  NEUTROABS 4.2  --   HGB 11.2* 10.5*  HCT 35.5* 32.8*  MCV 99.7 98.2  PLT 131* 130*   Cardiac Enzymes: No results for input(s): "CKTOTAL", "CKMB",  "CKMBINDEX", "TROPONINI" in the last 168 hours. BNP: BNP (last 3 results) No results for input(s): "BNP" in the last 8760 hours.  ProBNP (last 3 results) No results for input(s): "PROBNP" in the last 8760 hours.  CBG: Recent Labs  Lab 01/09/23 0744 01/09/23 1126 01/09/23 1751 01/09/23 2122 01/10/23 0814  GLUCAP 91 146* 144* 131* 105*       Signed:  Silvano Bilis MD.  [facility] 01/10/2023, 12:16 PM

## 2023-01-10 NOTE — H&P (Signed)
Matthew [name] UJW:[id number] DOB: 07-19-1930 DOA: 01/10/2023     PCP: Sherron Monday, MD   Outpatient Specialists:     Cardiology [facility] NEphrology:  Cherylann Ratel Patient arrived to ER on  at  Referred by Attending Ashok Pall, Wilfred Curtis, MD   Patient coming from:   Home w family Chief Complaint: Right hip pain   HPI: Matthew [name] is a 89 y.o. male with medical history significant of recent history of right hip fracture status post nail fixation, end-stage renal disease on hemodialysis Monday Wednesday Friday, A-fib, CAD, systolic CHF, type 2 diabetes, osteoporosis, HLD, family states has hx of aortic dissection    Presented with right hip pain  Patient was initially admitted at [facility] where he presented with right hip pain He originally had right hip surgery and intramedullary rod rod and neck screw placed after a fall was residing at a SNF but developed worsening pain repeat imaging showed signs of loosening screws and orthopedic surgeon recommended patient only does 30% weightbearing Despite patient following instructions he still has severe pain Plan was for patient to be transferred to [facility] under care of Dr. Carola Frost for possible revision Patient is on hemodialysis Monday Wednesday Friday and has received hemodialysis on 19 August  June 21 had hip repair at [facility] He had recent covid that prolonged his stay at rehab he was treated with Paxlovid went home August 3 As soon as he went home he continued to deteriorate    Denies significant ETOH intake   Does not smoke   Lab Results  Component Value Date   SARSCOV2NAA NEGATIVE 02/16/2021   SARSCOV2NAA NEGATIVE 02/11/2021   SARSCOV2NAA NEGATIVE 01/05/2021   SARSCOV2NAA NEGATIVE 12/25/2020       Regarding pertinent Chronic problems:     Hyperlipidemia -  no longer on statin Lipid Panel     Component Value Date/Time   CHOL 116 09/16/2022 1003   TRIG 48 09/16/2022 1003   HDL 63 09/16/2022 1003   LDLCALC 41  09/16/2022 1003   LABVLDL 12 09/16/2022 1003   Hypotension requires midodrine   chronic CHF  systolic -on Lasix 40 mg a day  CAD sp CABG 1999  Plavix on hold                -  followed by cardiology Last stent around 89 yo               DM 2 -  Lab Results  Component Value Date   HGBA1C 5.9 (H) 09/16/2022   on   PO meds only    Hypothyroidism:   Lab Results  Component Value Date   TSH 2.690 09/16/2022   on synthroid                        A. Fib -   atrial fibrillation CHA2DS2 vas score   6     Not on anticoagulation secondary to Risk of Falls,          -  Rate control:  Currently controlled with sotalol   end stage renal disease on hemodialysis Monday Wednesday Friday Lab Results  Component Value Date   CREATININE 3.76 (H) 01/10/2023   CREATININE 2.93 (H) 01/08/2023   CREATININE 2.98 (H) 11/19/2022   Lab Results  Component Value Date   NA 134 (L) 01/10/2023   CL 94 (L) 01/10/2023   K 4.7 01/10/2023   CO2 28 01/10/2023   BUN  85 (H) 01/10/2023   CREATININE 3.76 (H) 01/10/2023   GFRNONAA 15 (L) 01/10/2023   CALCIUM 8.8 (L) 01/10/2023   PHOS 5.8 (H) 11/15/2022   ALBUMIN 2.9 (L) 11/15/2022   GLUCOSE 116 (H) 01/10/2023     Chronic anemia - baseline hg Hemoglobin & Hematocrit  Recent Labs    11/19/22 0610 01/08/23 1358 01/10/23 0929  HGB 8.9* 11.2* 10.5*     Lab Orders  No laboratory test(s) ordered today   CT hip showed postsurgical changes from right ORIF associated with intertrochanteric fracture of the proximal right femur femoral neck screw has migrated Following Medications were ordered in ER: Medications - No data to display  __   Orthopedics Dr. Carola Frost was made aware     ED Triage Vitals  Encounter Vitals Group     BP      Systolic BP Percentile      Diastolic BP Percentile      Pulse      Resp      Temp      Temp src      SpO2      Weight      Height      Head Circumference      Peak Flow      Pain Score      Pain Loc      Pain  Education      Exclude from Growth Chart   TMAX(24)@     _________________________________________ Significant initial  Findings: Abnormal Labs Reviewed - No data to display      ECG: Ordered Personally reviewed and interpreted by me showing: HR : 60  Rhythm:  bigemeny LBBB,    QTC 478    COVID-19 Labs  No results for input(s): "DDIMER", "FERRITIN", "LDH", "CRP" in the last 72 hours.  Lab Results  Component Value Date   SARSCOV2NAA NEGATIVE 02/16/2021   SARSCOV2NAA NEGATIVE 02/11/2021   SARSCOV2NAA NEGATIVE 01/05/2021   SARSCOV2NAA NEGATIVE 12/25/2020       WBC     Component Value Date/Time   WBC 6.0 01/10/2023 0929   LYMPHSABS 0.6 (L) 01/08/2023 1358   LYMPHSABS 0.8 09/16/2022 1003   MONOABS 1.0 01/08/2023 1358   EOSABS 0.3 01/08/2023 1358   EOSABS 0.3 09/16/2022 1003   BASOSABS 0.0 01/08/2023 1358   BASOSABS 0.1 09/16/2022 1003      Results for orders placed or performed in visit on 10/15/22  Culture, Urine     Status: Abnormal   Collection Time: 10/15/22  2:05 PM   Specimen: Urine   UC  Result Value Ref Range Status   Urine Culture, Routine Final report (A)  Final   Organism ID, Bacteria Escherichia coli (A)  Final    Comment: Cefazolin <=4 ug/mL Cefazolin with an MIC <=16 predicts susceptibility to the oral agents cefaclor, cefdinir, cefpodoxime, cefprozil, cefuroxime, cephalexin, and loracarbef when used for therapy of uncomplicated urinary tract infections due to E. coli, Klebsiella pneumoniae, and Proteus mirabilis. Greater than 100,000 colony forming units per mL    Antimicrobial Susceptibility Comment  Final    Comment:       ** S = Susceptible; I = Intermediate; R = Resistant **                    P = Positive; N = Negative             MICS are expressed in micrograms per mL    Antibiotic  RSLT#1    RSLT#2    RSLT#3    RSLT#4 Amoxicillin/Clavulanic Acid    S Ampicillin                     S Cefepime                        S Ceftriaxone                    S Cefuroxime                     S Ciprofloxacin                  S Ertapenem                      S Gentamicin                     S Imipenem                       S Levofloxacin                   S Meropenem                      S Nitrofurantoin                 S Piperacillin/Tazobactam        S Tetracycline                   S Tobramycin                     S Trimethoprim/Sulfa             S     ABX started Antibiotics Given (last 72 hours)     None       No results found for the last 90 days.    __________________________________________________________ Recent Labs  Lab 01/08/23 1358 01/10/23 0408  NA 134* 134*  K 4.4 4.7  CO2 28 28  GLUCOSE 154* 116*  BUN 65* 85*  CREATININE 2.93* 3.76*  CALCIUM 8.7* 8.8*    Cr  stable,    Lab Results  Component Value Date   CREATININE 3.76 (H) 01/10/2023   CREATININE 2.93 (H) 01/08/2023   CREATININE 2.98 (H) 11/19/2022    No results for input(s): "AST", "ALT", "ALKPHOS", "BILITOT", "PROT", "ALBUMIN" in the last 168 hours. Lab Results  Component Value Date   CALCIUM 8.8 (L) 01/10/2023   PHOS 5.8 (H) 11/15/2022       Plt: Lab Results  Component Value Date   PLT 130 (L) 01/10/2023       Recent Labs  Lab 01/08/23 1358 01/10/23 0929 01/11/23 0019  WBC 6.0 6.0 5.1  NEUTROABS 4.2  --   --   HGB 11.2* 10.5* 11.2*  HCT 35.5* 32.8* 34.6*  MCV 99.7 98.2 99.7  PLT 131* 130* 129*    HG/HCT  stable,   from baseline see below    Component Value Date/Time   HGB 11.2 (L) 01/11/2023 0019   HGB 12.1 (L) 09/16/2022 1003   HCT 34.6 (L) 01/11/2023 0019   HCT 36.9 (L) 09/16/2022 1003   MCV 99.7 01/11/2023 0019   MCV 96 09/16/2022 1003     _______________________________________________ Hospitalist was called for admission for  right hip pain  The following Work up has been ordered so far:  No orders of the defined types were placed in this encounter.    OTHER Significant  initial  Findings:  labs showing:     DM  labs:  HbA1C: Recent Labs    09/16/22 1003  HGBA1C 5.9*      CBG (last 3)  Recent Labs    01/09/23 2122 01/10/23 0814 01/10/23 1853  GLUCAP 131* 105* 111*       Cultures:    Component Value Date/Time   SDES BLOOD BLOOD LEFT ARM 05/24/2019 0021   SDES BLOOD BLOOD LEFT HAND 05/24/2019 0021   SDES  05/24/2019 0021    IN/OUT CATH URINE Performed at [facility], [address] [place] [place], [place] [address]    SPECREQUEST  05/24/2019 0021    BOTTLES DRAWN AEROBIC AND ANAEROBIC Blood Culture adequate volume   SPECREQUEST  05/24/2019 0021    BOTTLES DRAWN AEROBIC AND ANAEROBIC Blood Culture adequate volume   SPECREQUEST  05/24/2019 0021    NONE Performed at [facility], [address] [place] [place], [place] [address]    CULT  05/24/2019 0021    NO GROWTH 5 DAYS Performed at [facility], [address] [place] [place], [place] [address]    CULT  05/24/2019 0021    NO GROWTH 5 DAYS Performed at [facility], [address] [place] [place], [place] [address]    CULT  05/24/2019 0021    NO GROWTH Performed at [facility], [address], [place], [place] [address]    REPTSTATUS 05/29/2019 FINAL 05/24/2019 0021   REPTSTATUS 05/29/2019 FINAL 05/24/2019 0021   REPTSTATUS 05/25/2019 FINAL 05/24/2019 0021     Radiological Exams on Admission: CT HIP RIGHT WO CONTRAST  Result Date: 01/10/2023 CLINICAL DATA:  Hip replacement, loosening suspected Hip surgical planning EXAM: CT OF THE RIGHT HIP WITHOUT CONTRAST TECHNIQUE: Multidetector CT imaging of the right hip was performed according to the standard protocol. Multiplanar CT image reconstructions were also generated. RADIATION DOSE REDUCTION: This exam was performed according to the departmental dose-optimization program which includes automated exposure control, adjustment of the mA and/or kV according to patient size and/or use of iterative reconstruction  technique. COMPARISON:  X-ray 01/08/2023, 11/12/2022 FINDINGS: Bones/Joint/Cartilage Postsurgical changes from right hip ORIF associated with a intertrochanteric fracture of the proximal right femur. Fracture lines remain visible. There is mild periosteal new bone formation at the fracture location. Femoral neck lag screw has migrated within the femoral head and the distal tip is now positioned intra-articularly extending 2 mm beyond the femoral head cortex. No definite new fracture. Hip joint alignment is maintained without dislocation. Mild to moderate right hip joint osteoarthritis. Old healed fractures of the right superior and inferior pubic rami. Ligaments Suboptimally assessed by CT. Muscles and Tendons No acute musculotendinous abnormality by CT. Soft tissues Nonspecific soft tissue swelling/edema over the lateral aspect of the right hip and proximal thigh. No organized fluid collections. Atherosclerotic vascular calcification. No right inguinal lymphadenopathy. IMPRESSION: 1. Postsurgical changes from right hip ORIF associated with a intertrochanteric fracture of the proximal right femur. Fracture lines remain visible. There is mild periosteal new bone formation at the fracture location. 2. Femoral neck lag screw has migrated within the femoral head and the distal tip is now positioned intra-articularly. 3. Mild to moderate right hip joint osteoarthritis. 4. Old healed fractures of the right superior and inferior pubic rami. 5. Nonspecific soft tissue swelling/edema over the  lateral aspect of the right hip and proximal thigh. No organized fluid collections. Electronically Signed   By: Duanne Guess D.O.   On: 01/10/2023 11:44   _______________________________________________________________________________________________________ Latest  There were no vitals taken for this visit.   Vitals  labs and radiology finding personally reviewed  Review of Systems:    Pertinent positives include: Right hip  pain Constitutional:  No weight loss, night sweats, Fevers, chills, fatigue, weight loss  HEENT:  No headaches, Difficulty swallowing,Tooth/dental problems,Sore throat,  No sneezing, itching, ear ache, nasal congestion, post nasal drip,  Cardio-vascular:  No chest pain, Orthopnea, PND, anasarca, dizziness, palpitations.no Bilateral lower extremity swelling  GI:  No heartburn, indigestion, abdominal pain, nausea, vomiting, diarrhea, change in bowel habits, loss of appetite, melena, blood in stool, hematemesis Resp:  no shortness of breath at rest. No dyspnea on exertion, No excess mucus, no productive cough, No non-productive cough, No coughing up of blood.No change in color of mucus.No wheezing. Skin:  no rash or lesions. No jaundice GU:  no dysuria, change in color of urine, no urgency or frequency. No straining to urinate.  No flank pain.  Musculoskeletal:  No joint pain or no joint swelling. No decreased range of motion. No back pain.  Psych:  No change in mood or affect. No depression or anxiety. No memory loss.  Neuro: no localizing neurological complaints, no tingling, no weakness, no double vision, no gait abnormality, no slurred speech, no confusion  All systems reviewed and apart from HOPI all are negative _______________________________________________________________________________________________ Past Medical History:   Past Medical History:  Diagnosis Date   Anginal pain (HCC)    Arthritis    Ascending aortic aneurysm (HCC) 07/18/2015   a.) TTE 07/18/2015: severe dilation of ascending aorta measuring 7.0 cm. b.) TTE 01/10/2018: aortic root 3.8 cm; ascending aorta 6.8 cm; refused surgical intervention/repair.   Atrial fibrillation (HCC)    a.) CHA2DS2-VASc = 6 (age x 2, HFrEF, HTN, previous MI, T2DM). b.) rate/rhythm maintained on amiodarone + metoprolol succinate; chronic antiplatelet therapy using clopidogrel   Bradycardia    Broken arm 05/2013   Left   Cardiac  murmur    a.) RIGHT upper sternal border   Cardiomyopathy [facility])    Coronary artery disease    DOE (dyspnea on exertion)    ESRD (end stage renal disease) (HCC)    GERD (gastroesophageal reflux disease)    HFrEF (heart failure with reduced ejection fraction) (HCC)    a.) TTE 12/03/2013: mod dec LV function; EF 35-40%; severe inferolateral and inferior HK; LA dilated; G3DD; PASP 52 mmHg. b.) TTE 07/18/2015: mild LV dysfunction with mild LVH; EF 45%; mild BAE. c.) TTE 01/10/2018: mod LV dysfunction; EF 45%; diffuse HK   HLD (hyperlipidemia)    Hypertension    IDA (iron deficiency anemia)    Long term current use of antithrombotics/antiplatelets    a.) clopidogrel   Myocardial infarction (HCC) 2000   Peripheral edema    Pneumonia    S/P CABG x 4 2001   a.) LIMA-LAD, SVG-D1, SVG-OM1, SVG-OM2, SVG-PDA   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   left frontal scalp, EDC   Squamous cell carcinoma of skin 05/05/2020   left temporal scalp, in situ, EDC 05/13/20   T2DM (type 2 diabetes mellitus) (HCC)    Valvular insufficiency 12/03/2013   a.) TTE 12/03/2013: EF 35-40%; mild AR/MR, b.) TTE 07/18/2015: EF 45%; mild AR/PR, mod TR, severe MR. c.) TTE 01/10/2018: mod AR/TR      Past  Surgical History:  Procedure Laterality Date   A/V SHUNTOGRAM Left 04/01/2022   Procedure: A/V SHUNTOGRAM;  Surgeon: Annice Needy, MD;  Location: [facility] INVASIVE CV LAB;  Service: Cardiovascular;  Laterality: Left;   AV FISTULA PLACEMENT Left 04/15/2021   Procedure: INSERTION OF ARTERIOVENOUS (AV) GORE-TEX GRAFT ARM ( BRACHIAL AXILLARY);  Surgeon: Annice Needy, MD;  Location: [facility] ORS;  Service: Vascular;  Laterality: Left;   CATARACT EXTRACTION W/PHACO Left 07/26/2017   Procedure: CATARACT EXTRACTION PHACO AND INTRAOCULAR LENS PLACEMENT (IOC);  Surgeon: Galen Manila, MD;  Location: [facility] ORS;  Service: Ophthalmology;  Laterality: Left;  [place]   00:45.8 AP%  13.1 CDE  6.00 Fluid Pack Lot # [id number]   CATARACT  EXTRACTION W/PHACO Right 08/17/2017   Procedure: CATARACT EXTRACTION PHACO AND INTRAOCULAR LENS PLACEMENT (IOC);  Surgeon: Galen Manila, MD;  Location: [facility] ORS;  Service: Ophthalmology;  Laterality: Right;  [place]  01:30 AP% 16.8 CDE 15.24 Fluid pack lot # [id number]   CHOLECYSTECTOMY     CORONARY ARTERY BYPASS GRAFT N/A 2001   Procedure: 5v CABG (LIMA-LAD, SVG-D1, SVG-OM1, SVG-OM2, SVG-PDA)   DIALYSIS/PERMA CATHETER INSERTION N/A 02/09/2018   Procedure: DIALYSIS/PERMA CATHETER INSERTION;  Surgeon: Annice Needy, MD;  Location: [facility] CV LAB;  Service: Cardiovascular;  Laterality: N/A;   DIALYSIS/PERMA CATHETER REMOVAL N/A 06/25/2021   Procedure: DIALYSIS/PERMA CATHETER REMOVAL;  Surgeon: Annice Needy, MD;  Location: [facility] CV [facility];  Service: Cardiovascular;  Laterality: N/A;   FRACTURE SURGERY Left 2015   LEFT arm   INSERTION OF DIALYSIS CATHETER Right 11/03/2020   Procedure: INSERTION OF Perm Cath in the RIGHT INTERNAL JUGULAR;  Surgeon: Annice Needy, MD;  Location: [facility] ORS;  Service: Vascular;  Laterality: Right;   INTRAMEDULLARY (IM) NAIL INTERTROCHANTERIC Right 11/12/2022   Procedure: INTRAMEDULLARY (IM) NAIL INTERTROCHANTERIC;  Surgeon: Juanell Fairly, MD;  Location: [facility] ORS;  Service: Orthopedics;  Laterality: Right;   LEFT HEART CATHETERIZATION WITH CORONARY/GRAFT ANGIOGRAM Left 12/04/2013   Procedure: LEFT HEART CATHETERIZATION WITH Isabel Caprice;  Surgeon: Marykay Lex, MD;  Location: [facility] CATH LAB;  Service: Cardiovascular;  Laterality: Left;   LEFT HEART CATHETERIZATION WITH CORONARY/GRAFT ANGIOGRAM Left 02/06/2009   Procedure: LEFT HEART CATHETERIZATION WITH CORONARY/GRAFT ANGIOGRAM; Location: [facility]; Surgeon: Despina Hick, MD   PERCUTANEOUS CORONARY STENT INTERVENTION (PCI-S) N/A 12/06/2013   Procedure: STAGED PERCUTANEOUS CORONARY STENT INTERVENTION (overlapping 4.0 x 38 mm (mid) and 4.0 x 28 mm (ostial) Promus Primier DES to SVG-RPDA graft);  Surgeon:  Lesleigh Noe, MD;  Location: [facility] CATH LAB;  Service: Cardiovascular   REMOVAL OF A DIALYSIS CATHETER N/A 11/03/2020   Procedure: REMOVAL OF A DIALYSIS CATHETER;  Surgeon: Annice Needy, MD;  Location: [facility] ORS;  Service: Vascular;  Laterality: N/A;    Social History:  Ambulatory   walker      reports that he quit smoking about 32 years ago. His smoking use included pipe and cigarettes. He has never used smokeless tobacco. He reports that he does not currently use alcohol after a past usage of about 1.0 standard drink of alcohol per week. He reports that he does not use drugs.    Family History:   Family History  Problem Relation Age of Onset   Heart attack Father 29       died first MI at age 54   Heart failure Mother    ______________________________________________________________________________________________ Allergies: Allergies  Allergen Reactions   Other     Other reaction(s): Myalgias (intolerance), Other (See  Comments) Statin Drugs  Statin Drugs     Amoxicillin Diarrhea     Prior to Admission medications   Medication Sig Start Date End Date Taking? Authorizing Provider  bisacodyl (DULCOLAX) 10 MG suppository Place 1 suppository (10 mg total) rectally daily as needed for moderate constipation. Patient not taking: Reported on 11/12/2022 01/05/21   Enedina Finner, MD  cetirizine (ZYRTEC) 10 MG tablet Take 10 mg by mouth daily.    [provider]  cholecalciferol (VITAMIN D) 25 MCG (1000 UNIT) tablet Take 1,000 Units by mouth daily with breakfast.    [provider]  clopidogrel (PLAVIX) 75 MG tablet Take 1 tablet (75 mg total) by mouth daily. 12/02/22   Laurier Nancy, MD  ferrous sulfate 325 (65 FE) MG tablet Take 325 mg by mouth 2 (two) times daily with a meal.     [provider]  fluticasone (FLONASE) 50 MCG/ACT nasal spray Place 2 sprays into both nostrils daily as needed for allergies or rhinitis.    [provider]  furosemide  (LASIX) 40 MG tablet Take 40 mg by mouth.    [provider]  glipiZIDE (GLUCOTROL) 5 MG tablet Take 2.5 mg by mouth daily before breakfast. 12/31/22   [provider]  levothyroxine (SYNTHROID) 88 MCG tablet Take 88 mcg by mouth daily before breakfast.    [provider]  lidocaine-prilocaine (EMLA) cream Apply 1 Application topically as needed (before dialysis).    [provider]  midodrine (PROAMATINE) 5 MG tablet Take 1 tablet (5 mg total) by mouth 3 (three) times daily with meals. Patient taking differently: Take 5 mg by mouth. 5MG  1 HOUR BEFORE DIALYSIS 02/17/21   Delfino Lovett, MD  multivitamin (RENA-VIT) TABS tablet Take 1 tablet by mouth daily.    [provider]  nitroGLYCERIN (NITROSTAT) 0.4 MG SL tablet Place 0.4 mg under the tongue every 5 (five) minutes as needed for chest pain.    [provider]  Omega-3 Fatty Acids (FISH OIL) 1000 MG CAPS Take 4,000 mg by mouth daily with lunch.    [provider]  polyethylene glycol (MIRALAX / GLYCOLAX) 17 g packet Take 17 g by mouth daily. 01/06/21   Enedina Finner, MD  potassium chloride SA (KLOR-CON) 20 MEQ tablet Take 40 mEq by mouth daily.    [provider]  rosuvastatin (CRESTOR) 40 MG tablet TAKE 1 TABLET BY MOUTH ONCE  DAILY 10/28/22   Adrian Blackwater A, MD  senna-docusate (SENOKOT-S) 8.6-50 MG tablet Take 2 tablets by mouth 2 (two) times daily. 01/05/21   Enedina Finner, MD  sotalol (BETAPACE) 80 MG tablet Take 1 tablet (80 mg total) by mouth 2 (two) times daily. 10/14/22   Laurier Nancy, MD  Suvorexant (BELSOMRA) 10 MG TABS Take 10 mg by mouth at bedtime.    [provider]    ___________________________________________________________________________________________________ Physical Exam:    01/10/2023    8:53 PM 01/10/2023    7:54 PM 01/10/2023    5:42 PM  Vitals with BMI  Systolic 107 92 102  Diastolic 54 46 57  Pulse 75 96 62     1. General:  in No  Acute  distress    Chronically ill   -appearing 2. Psychological: Alert and   Oriented 3. Head/ENT:    Dry Mucous Membranes                          Head Non traumatic, neck supple  Poor Dentition 4. SKIN decreased Skin turgor,  Skin clean Dry and intact no rash    5. Heart: Regular rate and rhythm no  Murmur, no Rub or gallop 6. Lungs:  Clear to auscultation bilaterally, no wheezes or crackles   7. Abdomen: Soft,  non-tender, Non distended   obese  bowel sounds present 8. Lower extremities: no clubbing, cyanosis, no  edema 9. Neurologically Grossly intact, moving all 4 extremities equally  10. MSK: Normal range of motion    Chart has been reviewed  ______________________________________________________________________________________________  Assessment/Plan 89 y.o. male with medical history significant of recent history of right hip fracture status post nail fixation, end-stage renal disease on hemodialysis Monday Wednesday Friday, A-fib, CAD, systolic CHF, type 2 diabetes, osteoporosis, HLD  Admitted for right hip fracture    Present on Admission:  Closed right hip fracture (HCC)  Atrial fibrillation (HCC)  Chronic hypotension  CAD (coronary artery disease)  HFrEF (heart failure with reduced ejection fraction) (HCC)  Hypothyroidism  Dissecting aortic aneurysm, thoracic (HCC)  Chronic systolic CHF (congestive heart failure) (HCC)     Closed hip fracture requiring operative repair, right, sequela  - management as per orthopedics,  plan to operate  tomorrow pm   Keep nothing by mouth post midnight. Patient antiplatelet agents  on hold Ordered type and screen , order a vitamin D level  Patient at baseline unable to walk a flight of stairs or 100 feet due to pain     Patient denies any chest pain or shortness of breath currently and/or with exertion,   ECG showing no evidence of acute ischemia   known history of coronary artery disease,ESRD systolic CHF  and aortic dissection  Given advanced age patient is at least moderate  risk which has been discussed with family  further cardiac workup is indicated.   will order echo     Cardiology consult for further pre-op clearance and help w management given extensive cardiac disease    End-stage renal disease on hemodialysis (HCC) Continue midodrine Next hemodialysis on Wednesday Will need to let nephrology know patient has been admitted  Atrial fibrillation (HCC) Continue sotalol no longer on any anticoagulation appreciate cardiology involvement  Chronic hypotension Continue midodrine when able to tolerate  CAD (coronary artery disease) Patient no longer on statin.  Will hold Plavix given need for surgical intervention  HFrEF (heart failure with reduced ejection fraction) (HCC) Obtain echogram is no echogram has been done in our system since 2017 Patient have had minimal ambulation for the past 1 month Appreciate cardiology involvement for preop clearance  Hypothyroidism - Check TSH continue home medications Synthroid at po q day   Dissecting aortic aneurysm, thoracic [facility]) Anesthesiology should be aware of his diagnosis for the purposes of surgical intervention  Chronic systolic CHF (congestive heart failure) (HCC) Currently is to be euvolemic will avoid over aggressive fluid resuscitation hold Lasix for tonight given soft blood pressures and recent dialysis Repeat echogram  Other plan as per orders.  DVT prophylaxis:  SCD  Code Status:DNR/DNI  as per patient family  I had personally discussed CODE STATUS with patient and family  ACP  has been reviewed   Family Communication:   Family not at  Bedside  plan of care was discussed on the phone with   Daughter   Diet npo post midnight   Disposition Plan:     likely will need placement for rehabilitation  Following barriers for discharge:                           Hip repair                            Will need consultants to evaluate patient prior to discharge                         Transition of care consulted                      Consults called: orthopedics are aware Sent page to nephrology Dr. Signe Colt   Admission status:      inpatient     I Expect 2 midnight stay secondary to severity of patient's current illness need for inpatient interventions justified by the following:    Severe lab/radiological/exam abnormalities including:    Right hip fractures and extensive comorbidities including:  DM2   CHF  CAD  ESRD   That are currently affecting medical management.   I expect  patient to be hospitalized for 2 midnights requiring inpatient medical care.  Patient is at high risk for adverse outcome (such as loss of life or disability) if not treated.  Indication for inpatient stay as follows:    severe pain requiring acute inpatient management,  I  Need for operative/procedural  intervention     Need for IV fluids IV pain medications, I    Level of care     tele  For 24H     Lab Results  Component Value Date   SARSCOV2NAA NEGATIVE 02/16/2021     Enna Warwick 01/11/2023, 1:15 AM    [facility]     after 2 AM please page floor coverage PA If 7AM-7PM, please contact the day team taking care of the patient using [email or web address]

## 2023-01-10 NOTE — Progress Notes (Signed)
Subjective:   I have discussed the treatment plan with the patient and his son and daughter-in-law.  The plan is for him to be transferred to [facility] to Dr. Magdalene Patricia care.  It appears that this will be tomorrow due to his dialysis today and bed shortage at [facility].  Dr. Carola Frost will discuss treatment options in depth with them.    Patient reports pain as mild.  Objective:   VITALS:   Vitals:   01/10/23 1257 01/10/23 1431  BP: 124/61 (!) 110/56  Pulse: 79 77  Resp: 14 14  Temp: 97.8 F (36.6 C) 98.3 F (36.8 C)  SpO2: 96% 98%    Neurologically intact Dorsiflexion/Plantar flexion intact  LABS Recent Labs    01/08/23 1358 01/10/23 0929  HGB 11.2* 10.5*  HCT 35.5* 32.8*  WBC 6.0 6.0  PLT 131* 130*    Recent Labs    01/08/23 1358 01/10/23 0408  NA 134* 134*  K 4.4 4.7  BUN 65* 85*  CREATININE 2.93* 3.76*  GLUCOSE 154* 116*    No results for input(s): "LABPT", "INR" in the last 72 hours.   Assessment/Plan:      Plan for discharge tomorrow to [facility] for revision surgery on his right hip.

## 2023-01-10 NOTE — Plan of Care (Signed)

## 2023-01-10 NOTE — Progress Notes (Addendum)
Report was called to [facility], Given to Nurse Blima Singer, she acknowledge understanding of patient care.(Patient will be going [address] [place], Room 16, Nurses station number [phone]). Patient would be transferred by [facility] undetermined time frame at this time

## 2023-01-11 ENCOUNTER — Inpatient Hospital Stay (HOSPITAL_COMMUNITY): Admission: RE | Admit: 2023-01-11 | Payer: Medicare Other | Source: Home / Self Care | Admitting: Orthopedic Surgery

## 2023-01-11 ENCOUNTER — Other Ambulatory Visit: Payer: Self-pay

## 2023-01-11 ENCOUNTER — Encounter (HOSPITAL_COMMUNITY): Payer: Self-pay | Admitting: Internal Medicine

## 2023-01-11 ENCOUNTER — Encounter (HOSPITAL_COMMUNITY)
Disposition: A | Discharge status: Skilled Nursing Facility | Payer: Self-pay | Source: Other Acute Inpatient Hospital | Attending: Internal Medicine

## 2023-01-11 ENCOUNTER — Other Ambulatory Visit: Payer: Self-pay | Admitting: Internal Medicine

## 2023-01-11 ENCOUNTER — Inpatient Hospital Stay (HOSPITAL_COMMUNITY): Payer: Medicare Other

## 2023-01-11 DIAGNOSIS — R9431 Abnormal electrocardiogram [ECG] [EKG]: Secondary | ICD-10-CM | POA: Diagnosis not present

## 2023-01-11 DIAGNOSIS — I4819 Other persistent atrial fibrillation: Secondary | ICD-10-CM | POA: Diagnosis not present

## 2023-01-11 DIAGNOSIS — S72001A Fracture of unspecified part of neck of right femur, initial encounter for closed fracture: Secondary | ICD-10-CM

## 2023-01-11 DIAGNOSIS — I2510 Atherosclerotic heart disease of native coronary artery without angina pectoris: Secondary | ICD-10-CM

## 2023-01-11 LAB — COMPREHENSIVE METABOLIC PANEL
ALT: 24 U/L (ref 0–44)
AST: 25 U/L (ref 15–41)
Albumin: 2.7 g/dL — ABNORMAL LOW (ref 3.5–5.0)
Alkaline Phosphatase: 250 U/L — ABNORMAL HIGH (ref 38–126)
Anion gap: 10 (ref 5–15)
BUN: 48 mg/dL — ABNORMAL HIGH (ref 8–23)
CO2: 29 mmol/L (ref 22–32)
Calcium: 8.7 mg/dL — ABNORMAL LOW (ref 8.9–10.3)
Chloride: 94 mmol/L — ABNORMAL LOW (ref 98–111)
Creatinine, Ser: 2.72 mg/dL — ABNORMAL HIGH (ref 0.61–1.24)
GFR, Estimated: 22 mL/min — ABNORMAL LOW (ref >60)
Glucose, Bld: 131 mg/dL — ABNORMAL HIGH (ref 70–99)
Potassium: 4.7 mmol/L (ref 3.5–5.1)
Sodium: 133 mmol/L — ABNORMAL LOW (ref 135–145)
Total Bilirubin: 0.7 mg/dL (ref 0.3–1.2)
Total Protein: 6.6 g/dL (ref 6.5–8.1)

## 2023-01-11 LAB — CBC
HCT: 34.6 % — ABNORMAL LOW (ref 39.0–52.0)
Hemoglobin: 11.2 g/dL — ABNORMAL LOW (ref 13.0–17.0)
MCH: 32.3 pg (ref 26.0–34.0)
MCHC: 32.4 g/dL (ref 30.0–36.0)
MCV: 99.7 fL (ref 80.0–100.0)
Platelets: 129 10*3/uL — ABNORMAL LOW (ref 150–400)
RBC: 3.47 MIL/uL — ABNORMAL LOW (ref 4.22–5.81)
RDW: 15.3 % (ref 11.5–15.5)
WBC: 5.1 10*3/uL (ref 4.0–10.5)
nRBC: 0 % (ref 0.0–0.2)

## 2023-01-11 LAB — GLUCOSE, CAPILLARY
Glucose-Capillary: 68 mg/dL — ABNORMAL LOW (ref 70–99)
Glucose-Capillary: 135 mg/dL — ABNORMAL HIGH (ref 70–99)
Glucose-Capillary: 110 mg/dL — ABNORMAL HIGH (ref 70–99)
Glucose-Capillary: 77 mg/dL (ref 70–99)
Glucose-Capillary: 109 mg/dL — ABNORMAL HIGH (ref 70–99)
Glucose-Capillary: 138 mg/dL — ABNORMAL HIGH (ref 70–99)

## 2023-01-11 LAB — SURGICAL PCR SCREEN
MRSA, PCR: NEGATIVE
Staphylococcus aureus: NEGATIVE

## 2023-01-11 LAB — HEPATITIS B SURFACE ANTIBODY, QUANTITATIVE: Hep B S AB Quant (Post): 24.9 m[IU]/mL

## 2023-01-11 LAB — HEPATITIS B SURFACE ANTIGEN: Hepatitis B Surface Ag: NONREACTIVE

## 2023-01-11 SURGERY — FIXATION, FRACTURE, INTERTROCHANTERIC, WITH INTRAMEDULLARY ROD
Anesthesia: General | Laterality: Right

## 2023-01-11 MED ORDER — MIDODRINE HCL 5 MG PO TABS
10.0000 mg | ORAL_TABLET | ORAL | Status: DC
  Administered 2023-01-13 – 2023-01-17 (×4): 10 mg via ORAL
  Filled 2023-01-11 (×5): qty 2

## 2023-01-11 MED ORDER — DEXTROSE 50 % IV SOLN
25.0000 mL | Freq: Once | INTRAVENOUS | Status: AC
  Administered 2023-01-11: 25 mL via INTRAVENOUS

## 2023-01-11 MED ORDER — CHLORHEXIDINE GLUCONATE CLOTH 2 % EX PADS
6.0000 | MEDICATED_PAD | Freq: Every day | CUTANEOUS | Status: DC
  Administered 2023-01-11 – 2023-01-13 (×3): 6 via TOPICAL

## 2023-01-11 MED ORDER — ENSURE MAX PROTEIN PO LIQD
11.0000 [oz_av] | Freq: Two times a day (BID) | ORAL | Status: DC
  Filled 2023-01-11 (×2): qty 330

## 2023-01-11 MED ORDER — DEXTROSE 50 % IV SOLN
INTRAVENOUS | Status: AC
  Filled 2023-01-11: qty 50

## 2023-01-11 MED ORDER — RENA-VITE PO TABS
1.0000 | ORAL_TABLET | Freq: Every day | ORAL | Status: DC
  Administered 2023-01-11 – 2023-01-16 (×6): 1 via ORAL
  Filled 2023-01-11 (×6): qty 1

## 2023-01-11 MED ORDER — ENSURE MAX PROTEIN PO LIQD
11.0000 [oz_av] | Freq: Every day | ORAL | Status: DC
  Administered 2023-01-11 – 2023-01-17 (×6): 11 [oz_av] via ORAL
  Filled 2023-01-11 (×7): qty 330

## 2023-01-11 NOTE — Progress Notes (Signed)
Initial Nutrition Assessment  DOCUMENTATION CODES:   Not applicable  INTERVENTION:  Renal Multivitamin w/ minerals daily Liberalize pt diet to Carb Modified due to increased needs for healing Ensure Max po Daily, each supplement provides 150 kcal and 30 grams of protein.   NUTRITION DIAGNOSIS:   Increased nutrient needs related to post-op healing as evidenced by estimated needs.  GOAL:   Patient will meet greater than or equal to 90% of their needs  MONITOR:   PO intake, Supplement acceptance, Labs, Weight trends, I & O's  REASON FOR ASSESSMENT:   Consult Assessment of nutrition requirement/status  ASSESSMENT:   89 y.o. male presented to the ED with R hip pain. PMH includes ESRD on HD (MWF), CAD, CHF, T2DM, HLD, hypothyroidism, and HTN. Pt transferred from [facility] with R hip nail migration.   8/17 - admitted to [facility] 8/19 - transferred to [facility] for procedure  Pt laying in bed, family at bedside. Reports that pt has a fairly good appetite at home, has decreased over the years and not eating as much as he use to. Eats 3 meals/day on non-HD days and 2 meals + a snack on HD days. Typically includes  Breakfast: eggs, bacon Snack: banana + peanut butter w/ protein shake Dinner: meat and vegetables Occasionally pt does not want a big dinner after dialysis and will just drinks a protein shake or grilled cheese sandwich. Takes Rena-vit daily at home and is not on a binder.  Reports a steady weight of ~200#, denies any changes in his EDW in the past year. Pt has been not as mobile lately after hip fracture and is using a Rolator to ambulate.   Plan for OR tomorrow to repair prior hip fracture.   Medications reviewed and include: NovoLog SSI Labs reviewed: Sodium 133, Potassium 4.7, Hgb A1c 6.0 CBG: 68-135 x 24 hrs   NUTRITION - FOCUSED PHYSICAL EXAM:   Flowsheet Row Most Recent Value  Orbital Region No depletion  Upper Arm Region Mild depletion  Thoracic and Lumbar Region No  depletion  Buccal Region No depletion  Temple Region No depletion  Clavicle Bone Region Mild depletion  Clavicle and Acromion Bone Region No depletion  Scapular Bone Region Mild depletion  Dorsal Hand Mild depletion  Patellar Region Mild depletion  Anterior Thigh Region Mild depletion  Posterior Calf Region Mild depletion   Diet Order:   Diet Order             Diet NPO time specified  Diet effective midnight           Diet Carb Modified Fluid consistency: Thin; Room service appropriate? Yes  Diet effective now                   EDUCATION NEEDS:   No education needs have been identified at this time  Skin:  Skin Assessment: Skin Integrity Issues: Skin Integrity Issues:: Stage II Stage II: Coccyx  Last BM:  8/19  Height:   Ht Readings from Last 1 Encounters:  01/10/23 6\' 1"  (1.854 m)    Weight:   Wt Readings from Last 1 Encounters:  01/10/23 98.2 kg    Ideal Body Weight:  83.6 kg  BMI:  Body mass index is 28.56 kg/m.  Estimated Nutritional Needs:  Kcal:  2200-2400 Protein:  110-130 grams Fluid:  1L + UOP   Kirby Crigler RD, LDN Clinical Dietitian See [facility] for contact information.

## 2023-01-11 NOTE — Hospital Course (Addendum)
89 y.o.M W/ recent history of right hip fracture s/p intramedullary rod and femoral neck screw ESRD on HD MWF,A-fib, CAD, systolic CHF, type 2 diabetes, osteoporosis, HLD, PAF not on anticoagulation due to fall risk, chronic orthostatic hypotension on midodrine, CAD/history of CABG, HFmrEF, hypothyroidism, insomnia,hx of aortic dissection, presented with right hip pain and admitted to [facility] from 8/17 to 8/19 and transferred to [facility] for to be evaluated by Dr. Carola Frost for migration of the femoral nail for possible surgical revision. Seen by cardiology and preop evaluation: Echo shows a decrease in EF to 35-40% from previous 55.8% form 08/2021 8/21: s/p conversion THA with femoral neck fracture. Postop he is doing well> waiting for skilled nursing facility.  He remains medically stable for discharge and facility can accept him on 01/17/23

## 2023-01-11 NOTE — Progress Notes (Signed)
CBG 68 . Johann Capers NP notified. Gave 25ml D50. Will rcheck in 15 mins.Marland Kitchen

## 2023-01-11 NOTE — Consult Note (Signed)
Renal Service Consult Note [facility]  Matthew [name] 01/11/2023 Maree Krabbe, MD Requesting Physician: Dr. Jonathon Bellows  Reason for Consult: ESRD pt needs redo hip surgery HPI: The patient is a 89 y.o. year-old w/ PMH as below who presented to [facility] on 8/17 w/ worsening hip pain. Pt has esrd on MWF HD, on midodrine, PAF no a/c, DM2. Pt had R hip ORIF surgery in June 2024. He went to rehab then home about 2 wks ago but w/ ambulation he has having progressive R hip pain. Skip Mayer showed concern for loosening of the screws and penetration into the joint space. Orthopedics consulted and plan is to redo the hip surgery. Pt was transferred to [facility] and surgery is scheduled for today.  We are asked to see for dialysis.   Pt seen in room. Family provides hx, last HD was yesterday at [facility]. Next HD due Wednesday. Going to surgery today. Pt is sedated and no hx taken.    ROS - n/a  Past Medical History  Past Medical History:  Diagnosis Date   Anginal pain (HCC)    Arthritis    Ascending aortic aneurysm (HCC) 07/18/2015   a.) TTE 07/18/2015: severe dilation of ascending aorta measuring 7.0 cm. b.) TTE 01/10/2018: aortic root 3.8 cm; ascending aorta 6.8 cm; refused surgical intervention/repair.   Atrial fibrillation (HCC)    a.) CHA2DS2-VASc = 6 (age x 2, HFrEF, HTN, previous MI, T2DM). b.) rate/rhythm maintained on amiodarone + metoprolol succinate; chronic antiplatelet therapy using clopidogrel   Bradycardia    Broken arm 05/2013   Left   Cardiac murmur    a.) RIGHT upper sternal border   Cardiomyopathy [facility])    Coronary artery disease    DOE (dyspnea on exertion)    ESRD (end stage renal disease) (HCC)    GERD (gastroesophageal reflux disease)    HFrEF (heart failure with reduced ejection fraction) (HCC)    a.) TTE 12/03/2013: mod dec LV function; EF 35-40%; severe inferolateral and inferior HK; LA dilated; G3DD; PASP 52 mmHg. b.) TTE 07/18/2015: mild LV dysfunction with mild LVH; EF  45%; mild BAE. c.) TTE 01/10/2018: mod LV dysfunction; EF 45%; diffuse HK   HLD (hyperlipidemia)    Hypertension    IDA (iron deficiency anemia)    Long term current use of antithrombotics/antiplatelets    a.) clopidogrel   Myocardial infarction [facility]) 2000   Peripheral edema    Pneumonia    S/P CABG x 4 2001   a.) LIMA-LAD, SVG-D1, SVG-OM1, SVG-OM2, SVG-PDA   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   left frontal scalp, EDC   Squamous cell carcinoma of skin 05/05/2020   left temporal scalp, in situ, EDC 05/13/20   T2DM (type 2 diabetes mellitus) (HCC)    Valvular insufficiency 12/03/2013   a.) TTE 12/03/2013: EF 35-40%; mild AR/MR, b.) TTE 07/18/2015: EF 45%; mild AR/PR, mod TR, severe MR. c.) TTE 01/10/2018: mod AR/TR   Past Surgical History  Past Surgical History:  Procedure Laterality Date   A/V SHUNTOGRAM Left 04/01/2022   Procedure: A/V SHUNTOGRAM;  Surgeon: Annice Needy, MD;  Location: [facility] INVASIVE CV LAB;  Service: Cardiovascular;  Laterality: Left;   AV FISTULA PLACEMENT Left 04/15/2021   Procedure: INSERTION OF ARTERIOVENOUS (AV) GORE-TEX GRAFT ARM ( BRACHIAL AXILLARY);  Surgeon: Annice Needy, MD;  Location: [facility] ORS;  Service: Vascular;  Laterality: Left;   CATARACT EXTRACTION W/PHACO Left 07/26/2017   Procedure: CATARACT EXTRACTION PHACO AND INTRAOCULAR LENS PLACEMENT (IOC);  Surgeon: Galen Manila, MD;  Location: [facility] ORS;  Service: Ophthalmology;  Laterality: Left;  [place]   00:45.8 AP%  13.1 CDE  6.00 Fluid Pack Lot # [id number]   CATARACT EXTRACTION W/PHACO Right 08/17/2017   Procedure: CATARACT EXTRACTION PHACO AND INTRAOCULAR LENS PLACEMENT (IOC);  Surgeon: Galen Manila, MD;  Location: [facility] ORS;  Service: Ophthalmology;  Laterality: Right;  [place]  01:30 AP% 16.8 CDE 15.24 Fluid pack lot # [id number]   CHOLECYSTECTOMY     CORONARY ARTERY BYPASS GRAFT N/A 2001   Procedure: 5v CABG (LIMA-LAD, SVG-D1, SVG-OM1, SVG-OM2, SVG-PDA)   DIALYSIS/PERMA CATHETER INSERTION N/A  02/09/2018   Procedure: DIALYSIS/PERMA CATHETER INSERTION;  Surgeon: Annice Needy, MD;  Location: [facility] INVASIVE CV LAB;  Service: Cardiovascular;  Laterality: N/A;   DIALYSIS/PERMA CATHETER REMOVAL N/A 06/25/2021   Procedure: DIALYSIS/PERMA CATHETER REMOVAL;  Surgeon: Annice Needy, MD;  Location: [facility] INVASIVE CV LAB;  Service: Cardiovascular;  Laterality: N/A;   FRACTURE SURGERY Left 2015   LEFT arm   INSERTION OF DIALYSIS CATHETER Right 11/03/2020   Procedure: INSERTION OF Perm Cath in the RIGHT INTERNAL JUGULAR;  Surgeon: Annice Needy, MD;  Location: [facility] ORS;  Service: Vascular;  Laterality: Right;   INTRAMEDULLARY (IM) NAIL INTERTROCHANTERIC Right 11/12/2022   Procedure: INTRAMEDULLARY (IM) NAIL INTERTROCHANTERIC;  Surgeon: Juanell Fairly, MD;  Location: [facility] ORS;  Service: Orthopedics;  Laterality: Right;   LEFT HEART CATHETERIZATION WITH CORONARY/GRAFT ANGIOGRAM Left 12/04/2013   Procedure: LEFT HEART CATHETERIZATION WITH Isabel Caprice;  Surgeon: Marykay Lex, MD;  Location: [facility] CATH LAB;  Service: Cardiovascular;  Laterality: Left;   LEFT HEART CATHETERIZATION WITH CORONARY/GRAFT ANGIOGRAM Left 02/06/2009   Procedure: LEFT HEART CATHETERIZATION WITH CORONARY/GRAFT ANGIOGRAM; Location: [facility]; Surgeon: Despina Hick, MD   PERCUTANEOUS CORONARY STENT INTERVENTION (PCI-S) N/A 12/06/2013   Procedure: STAGED PERCUTANEOUS CORONARY STENT INTERVENTION (overlapping 4.0 x 38 mm (mid) and 4.0 x 28 mm (ostial) Promus Primier DES to SVG-RPDA graft);  Surgeon: Lesleigh Noe, MD;  Location: [facility] CATH LAB;  Service: Cardiovascular   REMOVAL OF A DIALYSIS CATHETER N/A 11/03/2020   Procedure: REMOVAL OF A DIALYSIS CATHETER;  Surgeon: Annice Needy, MD;  Location: [facility] ORS;  Service: Vascular;  Laterality: N/A;   Family History  Family History  Problem Relation Age of Onset   Heart attack Father 52       died first MI at age 79   Heart failure Mother    Social History  reports that he quit  smoking about 32 years ago. His smoking use included pipe and cigarettes. He has never used smokeless tobacco. He reports that he does not currently use alcohol after a past usage of about 1.0 standard drink of alcohol per week. He reports that he does not use drugs. Allergies  Allergies  Allergen Reactions   Other     Other reaction(s): Myalgias (intolerance), Other (See Comments) Statin Drugs  Statin Drugs     Amoxicillin Diarrhea   Home medications Prior to Admission medications   Medication Sig Start Date End Date Taking? Authorizing Provider  bisacodyl (DULCOLAX) 10 MG suppository Place 1 suppository (10 mg total) rectally daily as needed for moderate constipation. Patient not taking: Reported on 11/12/2022 01/05/21   Enedina Finner, MD  cetirizine (ZYRTEC) 10 MG tablet Take 10 mg by mouth daily.    [provider]  cholecalciferol (VITAMIN D) 25 MCG (1000 UNIT) tablet Take 1,000 Units by mouth daily with breakfast.    [provider]  clopidogrel (PLAVIX) 75 MG tablet Take 1 tablet (75 mg total) by mouth daily. 12/02/22   Laurier Nancy, MD  ferrous sulfate 325 (65 FE) MG tablet Take 325 mg by mouth 2 (two) times daily with a meal.     [provider]  fluticasone (FLONASE) 50 MCG/ACT nasal spray Place 2 sprays into both nostrils daily as needed for allergies or rhinitis.    [provider]  furosemide (LASIX) 40 MG tablet Take 40 mg by mouth.    [provider]  glipiZIDE (GLUCOTROL) 5 MG tablet Take 2.5 mg by mouth daily before breakfast. 12/31/22   [provider]  levothyroxine (SYNTHROID) 88 MCG tablet Take 88 mcg by mouth daily before breakfast.    [provider]  lidocaine-prilocaine (EMLA) cream Apply 1 Application topically as needed (before dialysis).    [provider]  midodrine (PROAMATINE) 5 MG tablet Take 1 tablet (5 mg total) by mouth 3 (three) times daily with meals. Patient taking differently: Take 5  mg by mouth. 5MG  1 HOUR BEFORE DIALYSIS 02/17/21   Delfino Lovett, MD  multivitamin (RENA-VIT) TABS tablet Take 1 tablet by mouth daily.    [provider]  nitroGLYCERIN (NITROSTAT) 0.4 MG SL tablet Place 0.4 mg under the tongue every 5 (five) minutes as needed for chest pain.    [provider]  Omega-3 Fatty Acids (FISH OIL) 1000 MG CAPS Take 4,000 mg by mouth daily with lunch.    [provider]  polyethylene glycol (MIRALAX / GLYCOLAX) 17 g packet Take 17 g by mouth daily. 01/06/21   Enedina Finner, MD  potassium chloride SA (KLOR-CON) 20 MEQ tablet Take 40 mEq by mouth daily.    [provider]  rosuvastatin (CRESTOR) 40 MG tablet TAKE 1 TABLET BY MOUTH ONCE  DAILY 10/28/22   Adrian Blackwater A, MD  senna-docusate (SENOKOT-S) 8.6-50 MG tablet Take 2 tablets by mouth 2 (two) times daily. 01/05/21   Enedina Finner, MD  sotalol (BETAPACE) 80 MG tablet Take 1 tablet (80 mg total) by mouth 2 (two) times daily. 10/14/22   Laurier Nancy, MD  Suvorexant (BELSOMRA) 10 MG TABS Take 10 mg by mouth at bedtime.    [provider]     Vitals:   01/10/23 2100 01/11/23 0442 01/11/23 0749  BP:  97/63 (!) 88/54  Pulse:  78 85  Resp:   17  Temp:  (!) 97.5 F (36.4 C) 97.6 F (36.4 C)  TempSrc:  Oral Oral  SpO2:  95% 100%  Weight: 98.2 kg    Height: 6\' 1"  (1.854 m)     Exam Gen alert, no distress, elderly WM No rash, cyanosis or gangrene Sclera anicteric, throat clear  No jvd or bruits Chest clear bilat to bases, no rales/ wheezing RRR no RG Abd soft ntnd no mass or ascites +bs GU normal male MS no joint effusions or deformity Ext no LE or UE edema, no wounds or ulcers Neuro is alert, Ox 3 , nf    LUE AVF+bruit       Home meds include - plavix, lasix 40, glipizide, renvavite, klorcon 40, crestor, synthroid, midodrine 5 tid, sotalol 80 bid, suvorexant, prns     OP HD: MWF [facility]   3.5h  92.5kg   400/ 500   2/2.5 bath   15ga   LUA AVF  Heparin  2000u then 600/hr   Assessment/ Plan: R hip pain - secondary to femoral nail migration. To OR today.  ESRD - on HD MWF. Last HD yesterday at [facility]. Plan HD tomorrow.  Hypotension - chronic on midodrine Volume - no sig vol excess on exam, up 4-5kg by wts. UF goal 2-3 L as tol.  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 Cont binder while here.  Atrial fib - on BB HFrEF / hx CABG x 4      Rob Tyan Dy  MD [facility] 01/11/2023, 10:54 AM  Recent Labs  Lab 01/10/23 0929 01/10/23 2214 01/11/23 0019  HGB 10.5*  --  11.2*  ALBUMIN  --  2.7* 2.7*  CALCIUM  --  8.8* 8.7*  PHOS  --  3.4  --   CREATININE  --  2.72* 2.72*  K  --  4.4 4.7   Inpatient medications:  insulin aspart  0-9 Units Subcutaneous Q4H   levothyroxine  88 mcg Oral Q0600   midodrine  5 mg Oral TID WC   sotalol  80 mg Oral BID   zolpidem  5 mg Oral QHS    methocarbamol (ROBAXIN) IV     HYDROcodone-acetaminophen, methocarbamol **OR** methocarbamol (ROBAXIN) IV, morphine injection

## 2023-01-11 NOTE — TOC Initial Note (Addendum)
Transition of Care [facility]) - Initial/Assessment Note    Patient Details  Name: Matthew [name] MRN: [id number] Date of Birth: 1931-03-11  Transition of Care [facility]) CM/SW Contact:    Kingsley Plan, RN Phone Number: 01/11/2023, 1:04 PM  Clinical Narrative:                  Spoke to patient at bedside.   Patient was at [facility] for rehab, went home and then had to go to hospital.   Patient from home with daughter and son in law. Patient has a walker, rollator and wheel chair at home.   Patient currently has home health, he cannot recall name of the agency. Per chart it's [facility] . Left message with Elnita Maxwell with [facility]   Patient planned for surgery tomorrow. TOC team will continue to follow for discharge needs  Expected Discharge Plan:  (await post op evaluation) Barriers to Discharge: Continued Medical Work up   Patient Goals and CMS Choice Patient states their goals for this hospitalization and ongoing recovery are:: hopefully to go home          Expected Discharge Plan and Services       Living arrangements for the past 2 months: Single Family Home                 DME Arranged:  (await post op evaluation)         HH Arranged:  (Await post op evaluation)          Prior Living Arrangements/Services Living arrangements for the past 2 months: Single Family Home Lives with:: Adult Children Patient language and need for interpreter reviewed:: Yes        Need for Family Participation in Patient Care: Yes (Comment) Care giver support system in place?: Yes (comment) Current home services: DME Criminal Activity/Legal Involvement Pertinent to Current Situation/Hospitalization: No - Comment as needed  Activities of Daily Living      Permission Sought/Granted   Permission granted to share information with : No              Emotional Assessment Appearance:: Appears stated age Attitude/Demeanor/Rapport: Engaged, Gracious Affect (typically  observed): Accepting Orientation: : Oriented to Self, Oriented to Place, Oriented to  Time, Oriented to Situation Alcohol / Substance Use: Not Applicable Psych Involvement: No (comment)  Admission diagnosis:  Closed right hip fracture (HCC) [S72.001A] Patient Active Problem List   Diagnosis Date Noted   Closed right hip fracture (HCC) 01/10/2023   Hip fracture (HCC) 01/08/2023   Drop in hemoglobin 11/16/2022   Hyperkalemia 11/15/2022   Closed hip fracture requiring operative repair, right, sequela 11/13/2022   HFrEF (heart failure with reduced ejection fraction) (HCC) 11/12/2022   Transaminitis 11/12/2022   Age-related osteoporosis with current pathological fracture with routine healing 02/18/2021   Pressure injury of skin 02/15/2021   Pelvic fracture (HCC) 02/11/2021   End-stage renal disease on hemodialysis (HCC)    Chronic hypotension 12/26/2020   Tremor    Hypothyroidism    Chronic systolic CHF (congestive heart failure) (HCC)    Proximal humerus fracture 12/25/2020   Malposition of peritoneal dialysis catheter (HCC) 11/01/2020   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   Secondary hyperparathyroidism (HCC) 04/30/2018   Sepsis (HCC) 02/15/2018   Cardiomyopathy (HCC) 01/24/2018   Fluid overload 01/10/2018   Climacteric arthritis, lower leg 12/15/2017   Mitral regurgitation 12/15/2017   Hypoglycemia 12/09/2017   Osteoarthritis of knee 06/23/2016   Undiagnosed cardiac murmurs 09/04/2015  Aneurysm of thoracic aorta (HCC) 08/04/2015   Congestive heart failure (HCC) 08/04/2015   Postsurgical aortocoronary bypass status 08/04/2015   Chronic kidney disease 07/18/2015   Aortic root dilatation (HCC) 07/18/2015   Dissecting aortic aneurysm, 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/2015   Edema of both legs 07/18/2015   Shortness of breath 07/18/2015   Atrial fibrillation (HCC) 01/10/2014   Abnormal electrocardiogram 01/10/2014   Encounter for counseling 01/10/2014   Other general symptoms  01/10/2014   CAD (coronary artery disease) 12/04/2013   Unstable angina; Class III Angina 12/03/2013   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) 12/03/2013   HLD (hyperlipidemia) 12/03/2013   Heart disease 12/03/2013   Disorder of kidney and ureter 05/04/2013   Gastrointestinal hemorrhage 05/02/2012   Allergic rhinitis due to pollen 01/06/2007   Reflux esophagitis 01/06/2007   Overweight 10/06/2006   PCP:  Sherron Monday, MD Pharmacy:   [facility] [address], [place] - [address] [place] [address] Phone: [phone] Fax: [phone]  [facility] - [place], [place] - [address] [place] [address] Phone: [phone] Fax: [phone]  [facility] - [place], [place] - [address] [place]  [address] Phone: [phone] Fax: [phone]     Social Determinants of Health (SDOH) Social History: SDOH Screenings   Food Insecurity: No Food Insecurity (11/12/2022)  Housing: Low Risk  (11/12/2022)  Transportation Needs: No Transportation Needs (11/12/2022)  Utilities: Not At Risk (11/12/2022)  Depression (PHQ2-9): Low Risk  (09/21/2022)  Financial Resource Strain: Low Risk  (01/10/2018)  Physical Activity: Inactive (01/10/2018)  Social Connections: Socially Integrated (01/10/2018)  Stress: No Stress Concern Present (01/10/2018)  Tobacco Use: Medium Risk (01/08/2023)   SDOH Interventions:     Readmission Risk Interventions    01/09/2023    4:42 PM 02/12/2021    3:28 PM  Readmission Risk Prevention Plan  Transportation Screening Complete Complete  PCP or Specialist Appt within 3-5 Days Complete   HRI or Home Care Consult Complete   Social Work Consult for Recovery Care Planning/Counseling Complete   Palliative Care Screening Not Applicable   Medication Review Oceanographer) Complete Complete  SW Recovery Care/Counseling Consult  Complete  Skilled  Nursing Facility  Complete

## 2023-01-11 NOTE — Consult Note (Addendum)
Cardiology Consultation   Patient ID: MANO SITU MRN: [id number]; DOB: Aug 05, 1930  Admit date: 01/10/2023 Date of Consult: 01/11/2023  PCP:  Sherron Monday, MD   [facility] Providers Cardiologist:  None  Gavin Potters   Patient Profile:   Matthew [name] is a 89 y.o. male with a hx of CAD status post CABG 2001, paroxysmal atrial fibrillation (not on DOAC due to falls), chronic HFmrEF (normalization of EF), ESRD, hyperlipidemia, chronic orthostatic hypotension on midodrine, recent right hip fracture status post intramedullary rod and femoral neck screw, diabetes, osteoporosis, aortic dissection, who is being seen 01/11/2023 for the evaluation of preoperative evaluation at the request of Dr Jonathon Bellows.  History of Present Illness:   Matthew [name] has extensive cardiac history however seems to be stable.  He is followed by he has Dr. Welton Flakes at [facility].  He has history of CABG x 4 in 2001 managed chronically on Plavix and history of heart failure as well with EF as as low 35 to 40% however most recent echocardiogram in April 2023 shows normalization of EF.  Also has significant valvular disease however managed conservatively.  Goes to dialysis regularly and takes midodrine around these times. Has history of paroxysmal atrial fibrillation not on anticoagulation due to recurrent falls.  He was on amiodarone however has been switched to sotalol due to elevated LFTs.  Patient's daughter also states that he has prior aortic dissection felt to be chronic without any need for intervention.  Recently patient was seen in June for closed hip fracture repair.  Now he has been transferred from [facility] to [facility] for migration of the femoral nail and possible surgical revision.  Cardiology has been asked to preoperatively evaluate patient.  Patient is accompanied with daughter and son-in-law.  Overall they state that he does very well and is functionally independent with some mechanical  assistance.  He had CABG in 2001 and stents placed 10 years after however has done very well without any complaints of shortness of breath or chest pain.  He has nitroglycerin prescribed however never takes this.  Also has not had any significant issues with volume status.  Patient has no complaints currently and has denied any prior complaints of chest pain, shortness of breath, peripheral edema, syncope, palpitations.  He is compliant with all of his medications and functionally capable without any significant interference with ADLs. Ambulates well with a rollator.  Past Medical History:  Diagnosis Date   Anginal pain (HCC)    Arthritis    Ascending aortic aneurysm (HCC) 07/18/2015   a.) TTE 07/18/2015: severe dilation of ascending aorta measuring 7.0 cm. b.) TTE 01/10/2018: aortic root 3.8 cm; ascending aorta 6.8 cm; refused surgical intervention/repair.   Atrial fibrillation (HCC)    a.) CHA2DS2-VASc = 6 (age x 2, HFrEF, HTN, previous MI, T2DM). b.) rate/rhythm maintained on amiodarone + metoprolol succinate; chronic antiplatelet therapy using clopidogrel   Bradycardia    Coronary artery disease    ESRD (end stage renal disease) (HCC)    GERD (gastroesophageal reflux disease)    HFrEF (heart failure with reduced ejection fraction) (HCC)    a.) TTE 12/03/2013: mod dec LV function; EF 35-40%; severe inferolateral and inferior HK; LA dilated; G3DD; PASP 52 mmHg. b.) TTE 07/18/2015: mild LV dysfunction with mild LVH; EF 45%; mild BAE. c.) TTE 01/10/2018: mod LV dysfunction; EF 45%; diffuse HK   HLD (hyperlipidemia)    Hypertension    IDA (iron deficiency anemia)  Long term current use of antithrombotics/antiplatelets    a.) clopidogrel   Myocardial infarction (HCC) 2000   Pneumonia    S/P CABG x 4 2001   a.) LIMA-LAD, SVG-D1, SVG-OM1, SVG-OM2, SVG-PDA   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   left frontal scalp, EDC   Squamous cell carcinoma of skin 05/05/2020   left temporal scalp,  in situ, EDC 05/13/20   T2DM (type 2 diabetes mellitus) (HCC)    Valvular insufficiency 12/03/2013   a.) TTE 12/03/2013: EF 35-40%; mild AR/MR, b.) TTE 07/18/2015: EF 45%; mild AR/PR, mod TR, severe MR. c.) TTE 01/10/2018: mod AR/TR    Past Surgical History:  Procedure Laterality Date   A/V SHUNTOGRAM Left 04/01/2022   Procedure: A/V SHUNTOGRAM;  Surgeon: Annice Needy, MD;  Location: [facility] INVASIVE CV LAB;  Service: Cardiovascular;  Laterality: Left;   AV FISTULA PLACEMENT Left 04/15/2021   Procedure: INSERTION OF ARTERIOVENOUS (AV) GORE-TEX GRAFT ARM ( BRACHIAL AXILLARY);  Surgeon: Annice Needy, MD;  Location: [facility] ORS;  Service: Vascular;  Laterality: Left;   CATARACT EXTRACTION W/PHACO Left 07/26/2017   Procedure: CATARACT EXTRACTION PHACO AND INTRAOCULAR LENS PLACEMENT (IOC);  Surgeon: Galen Manila, MD;  Location: [facility] ORS;  Service: Ophthalmology;  Laterality: Left;  [place]   00:45.8 AP%  13.1 CDE  6.00 Fluid Pack Lot # [id number]   CATARACT EXTRACTION W/PHACO Right 08/17/2017   Procedure: CATARACT EXTRACTION PHACO AND INTRAOCULAR LENS PLACEMENT (IOC);  Surgeon: Galen Manila, MD;  Location: [facility] ORS;  Service: Ophthalmology;  Laterality: Right;  [place]  01:30 AP% 16.8 CDE 15.24 Fluid pack lot # [id number]   CHOLECYSTECTOMY     CORONARY ARTERY BYPASS GRAFT N/A 2001   Procedure: 5v CABG (LIMA-LAD, SVG-D1, SVG-OM1, SVG-OM2, SVG-PDA)   DIALYSIS/PERMA CATHETER INSERTION N/A 02/09/2018   Procedure: DIALYSIS/PERMA CATHETER INSERTION;  Surgeon: Annice Needy, MD;  Location: [facility] CV [facility];  Service: Cardiovascular;  Laterality: N/A;   DIALYSIS/PERMA CATHETER REMOVAL N/A 06/25/2021   Procedure: DIALYSIS/PERMA CATHETER REMOVAL;  Surgeon: Annice Needy, MD;  Location: [facility];  Service: Cardiovascular;  Laterality: N/A;   FRACTURE SURGERY Left 2015   LEFT arm   INSERTION OF DIALYSIS CATHETER Right 11/03/2020   Procedure: INSERTION OF Perm Cath in the RIGHT INTERNAL JUGULAR;   Surgeon: Annice Needy, MD;  Location: [facility] ORS;  Service: Vascular;  Laterality: Right;   INTRAMEDULLARY (IM) NAIL INTERTROCHANTERIC Right 11/12/2022   Procedure: INTRAMEDULLARY (IM) NAIL INTERTROCHANTERIC;  Surgeon: Juanell Fairly, MD;  Location: [facility] ORS;  Service: Orthopedics;  Laterality: Right;   LEFT HEART CATHETERIZATION WITH CORONARY/GRAFT ANGIOGRAM Left 12/04/2013   Procedure: LEFT HEART CATHETERIZATION WITH Isabel Caprice;  Surgeon: Marykay Lex, MD;  Location: [facility] CATH LAB;  Service: Cardiovascular;  Laterality: Left;   LEFT HEART CATHETERIZATION WITH CORONARY/GRAFT ANGIOGRAM Left 02/06/2009   Procedure: LEFT HEART CATHETERIZATION WITH CORONARY/GRAFT ANGIOGRAM; Location: [facility]; Surgeon: Despina Hick, MD   PERCUTANEOUS CORONARY STENT INTERVENTION (PCI-S) N/A 12/06/2013   Procedure: STAGED PERCUTANEOUS CORONARY STENT INTERVENTION (overlapping 4.0 x 38 mm (mid) and 4.0 x 28 mm (ostial) Promus Primier DES to SVG-RPDA graft);  Surgeon: Lesleigh Noe, MD;  Location: [facility] CATH LAB;  Service: Cardiovascular   REMOVAL OF A DIALYSIS CATHETER N/A 11/03/2020   Procedure: REMOVAL OF A DIALYSIS CATHETER;  Surgeon: Annice Needy, MD;  Location: [facility] ORS;  Service: Vascular;  Laterality: N/A;     Inpatient Medications: Scheduled Meds:  Chlorhexidine Gluconate Cloth  6 each Topical [id number]  insulin aspart  0-9 Units Subcutaneous Q4H   levothyroxine  88 mcg Oral Q0600   [START ON 01/12/2023] midodrine  10 mg Oral Q M,W,F-HD   midodrine  5 mg Oral TID WC   multivitamin  1 tablet Oral QHS   sotalol  80 mg Oral BID   zolpidem  5 mg Oral QHS   Continuous Infusions:  methocarbamol (ROBAXIN) IV     PRN Meds: HYDROcodone-acetaminophen, methocarbamol **OR** methocarbamol (ROBAXIN) IV, morphine injection  Allergies:    Allergies  Allergen Reactions   Other     Other reaction(s): Myalgias (intolerance), Other (See Comments) Statin Drugs  Statin Drugs     Amoxicillin Diarrhea     Social History:   Social History   Socioeconomic History   Marital status: Widowed    Spouse name: Not on file   Number of children: Not on file   Years of education: Not on file   Highest education level: Not on file  Occupational History   Not on file  Tobacco Use   Smoking status: Former    Current packs/day: 0.00    Types: Pipe, Cigarettes    Quit date: 70    Years since quitting: 32.6   Smokeless tobacco: Never  Vaping Use   Vaping status: Never Used  Substance and Sexual Activity   Alcohol use: Not Currently    Alcohol/week: 1.0 standard drink of alcohol    Types: 1 Cans of beer per week    Comment: occasionally drinking only,once a month   Drug use: No   Sexual activity: Never  Other Topics Concern   Not on file  Social History Narrative   Lives with daughter and son in law   Social Determinants of Health   Financial Resource Strain: Low Risk  (01/10/2018)   Overall Financial Resource Strain (CARDIA)    Difficulty of Paying Living Expenses: Not hard at all  Food Insecurity: No Food Insecurity (11/12/2022)   Hunger Vital Sign    Worried About Running Out of Food in the Last Year: Never true    Ran Out of Food in the Last Year: Never true  Transportation Needs: No Transportation Needs (11/12/2022)   PRAPARE - Administrator, Civil Service (Medical): No    Lack of Transportation (Non-Medical): No  Physical Activity: Inactive (01/10/2018)   Exercise Vital Sign    Days of Exercise per Week: 0 days    Minutes of Exercise per Session: 0 min  Stress: No Stress Concern Present (01/10/2018)   [employer] of Occupational Health - Occupational Stress Questionnaire    Feeling of Stress : Not at all  Social Connections: Socially Integrated (01/10/2018)   Social Connection and Isolation Panel [NHANES]    Frequency of Communication with Friends and Family: More than three times a week    Frequency of Social Gatherings with Friends and Family: More than  three times a week    Attends Religious Services: More than 4 times per year    Active Member of [employer] or Organizations: Yes    Attends Banker Meetings: More than 4 times per year    Marital Status: Married  Catering manager Violence: Not At Risk (11/12/2022)   Humiliation, Afraid, Rape, and Kick questionnaire    Fear of Current or Ex-Partner: No    Emotionally Abused: No    Physically Abused: No    Sexually Abused: No    Family History:   Family History  Problem Relation Age of  Onset   Heart attack Father 41       died first MI at age 51   Heart failure Mother      ROS:  Please see the history of present illness.  All other ROS reviewed and negative.     Physical Exam/Data:   Vitals:   01/10/23 2100 01/11/23 0442 01/11/23 0749 01/11/23 1213  BP:  97/63 (!) 88/54 (!) 100/59  Pulse:  78 85 63  Resp:   17 17  Temp:  (!) 97.5 F (36.4 C) 97.6 F (36.4 C) (!) 97.5 F (36.4 C)  TempSrc:  Oral Oral Oral  SpO2:  95% 100% 99%  Weight: 98.2 kg     Height: 6\' 1"  (1.854 m)       Intake/Output Summary (Last 24 hours) at 01/11/2023 1423 Last data filed at 01/11/2023 1212 Gross per 24 hour  Intake 50 ml  Output 200 ml  Net -150 ml      01/10/2023    9:00 PM 01/10/2023   12:57 PM 01/10/2023    8:44 AM  Last 3 Weights  Weight (lbs) 216 lb 7.9 oz 200 lb 9.9 oz 203 lb 14.8 oz  Weight (kg) 98.2 kg 91 kg 92.5 kg     Body mass index is 28.56 kg/m.  General:  Well nourished, well developed, in no acute distress HEENT: normal Neck: no JVD Vascular: No carotid bruits; Distal pulses 2+ bilaterally Cardiac: Irregular, +murmur Lungs:  clear to auscultation bilaterally, no wheezing, rhonchi or rales  Abd: soft, nontender, no hepatomegaly  Ext: no edema Musculoskeletal:  No deformities, BUE and BLE strength normal and equal Skin: warm and dry  Neuro:  CNs 2-12 intact, no focal abnormalities noted Psych:  Normal affect   EKG:  The EKG was personally reviewed and  demonstrates: Atrial fibrillation with PVCs, heart rate 74.  Left axis deviation.  Telemetry:  Telemetry was personally reviewed and demonstrates: Atrial fibrillation heart rate 60s  Relevant CV Studies: Echocardiogram 08/2021 An echocardiogram in (2-d) mode was performed and in Doppler mode with color flow velocity mapping was performed. The aortic valve cusps are abnormal 2.2   cm, flow velocity 1.42    m/s, and systolic calculated mean flow gradient 4  mmHg. Mitral valve diastolic peak flow velocity E 1.2   m/s and E/A ratio 2.1. Aortic root diameter 3.8   cm. The LVOT internal diameter 3.4  cm and flow velocity was abnormal .835  m/s. LV systolic dimension 4.05   cm, diastolic 5.74  cm, posterior wall thickness 1.06   cm, fractional shortening 29.4 %, and EF 55.8  %. IVS thickness 1.15   cm. LA dimension 5.1 cm. Mitral Valve has Moderate-Severe Regurgitation. Aortic Valve has Moderate-Severe Regurgitation. Tricuspid Valve has Moderate Regurgitation. Pulmonic Valve has Trace Regurgitation.  Laboratory Data:  High Sensitivity Troponin:  No results for input(s): "TROPONINIHS" in the last 720 hours.   Chemistry Recent Labs  Lab 01/10/23 0408 01/10/23 2214 01/11/23 0019  NA 134* 134* 133*  K 4.7 4.4 4.7  CL 94* 92* 94*  CO2 28 28 29   GLUCOSE 116* 123* 131*  BUN 85* 46* 48*  CREATININE 3.76* 2.72* 2.72*  CALCIUM 8.8* 8.8* 8.7*  MG  --  1.9  --   GFRNONAA 15* 22* 22*  ANIONGAP 12 14 10     Recent Labs  Lab 01/10/23 2214 01/11/23 0019  PROT 6.5 6.6  ALBUMIN 2.7* 2.7*  AST 27 25  ALT 24 24  ALKPHOS 236*  250*  BILITOT 0.4 0.7   Lipids No results for input(s): "CHOL", "TRIG", "HDL", "LABVLDL", "LDLCALC", "CHOLHDL" in the last 168 hours.  Hematology Recent Labs  Lab 01/08/23 1358 01/10/23 0929 01/10/23 2216 01/11/23 0019  WBC 6.0 6.0  --  5.1  RBC 3.56* 3.34* 3.47* 3.47*  HGB 11.2* 10.5*  --  11.2*  HCT 35.5* 32.8*  --  34.6*  MCV 99.7 98.2  --  99.7  MCH 31.5  31.4  --  32.3  MCHC 31.5 32.0  --  32.4  RDW 15.9* 15.6*  --  15.3  PLT 131* 130*  --  129*   Thyroid  Recent Labs  Lab 01/10/23 2214  TSH 4.027    BNPNo results for input(s): "BNP", "PROBNP" in the last 168 hours.  DDimer No results for input(s): "DDIMER" in the last 168 hours.   Radiology/Studies:  DG CHEST PORT 1 VIEW  Result Date: 01/10/2023 CLINICAL DATA:  Preop cardiovascular exam EXAM: PORTABLE CHEST 1 VIEW COMPARISON:  11/12/2022 FINDINGS: Prior CABG. Cardiomegaly. Aortic atherosclerosis. Vascular congestion. No overt edema. Linear atelectasis in the left mid lung. No visible effusions. No acute bony abnormality. IMPRESSION: Cardiomegaly, vascular congestion. Left mid lung subsegmental atelectasis. Electronically Signed   By: Charlett Nose M.D.   On: 01/10/2023 22:55   CT HIP RIGHT WO CONTRAST  Result Date: 01/10/2023 CLINICAL DATA:  Hip replacement, loosening suspected Hip surgical planning EXAM: CT OF THE RIGHT HIP WITHOUT CONTRAST TECHNIQUE: Multidetector CT imaging of the right hip was performed according to the standard protocol. Multiplanar CT image reconstructions were also generated. RADIATION DOSE REDUCTION: This exam was performed according to the departmental dose-optimization program which includes automated exposure control, adjustment of the mA and/or kV according to patient size and/or use of iterative reconstruction technique. COMPARISON:  X-ray 01/08/2023, 11/12/2022 FINDINGS: Bones/Joint/Cartilage Postsurgical changes from right hip ORIF associated with a intertrochanteric fracture of the proximal right femur. Fracture lines remain visible. There is mild periosteal new bone formation at the fracture location. Femoral neck lag screw has migrated within the femoral head and the distal tip is now positioned intra-articularly extending 2 mm beyond the femoral head cortex. No definite new fracture. Hip joint alignment is maintained without dislocation. Mild to moderate  right hip joint osteoarthritis. Old healed fractures of the right superior and inferior pubic rami. Ligaments Suboptimally assessed by CT. Muscles and Tendons No acute musculotendinous abnormality by CT. Soft tissues Nonspecific soft tissue swelling/edema over the lateral aspect of the right hip and proximal thigh. No organized fluid collections. Atherosclerotic vascular calcification. No right inguinal lymphadenopathy. IMPRESSION: 1. Postsurgical changes from right hip ORIF associated with a intertrochanteric fracture of the proximal right femur. Fracture lines remain visible. There is mild periosteal new bone formation at the fracture location. 2. Femoral neck lag screw has migrated within the femoral head and the distal tip is now positioned intra-articularly. 3. Mild to moderate right hip joint osteoarthritis. 4. Old healed fractures of the right superior and inferior pubic rami. 5. Nonspecific soft tissue swelling/edema over the lateral aspect of the right hip and proximal thigh. No organized fluid collections. Electronically Signed   By: Duanne Guess D.O.   On: 01/10/2023 11:44   DG Femur Portable Min 2 Views Right  Result Date: 01/08/2023 CLINICAL DATA:  Right hip surgery in June with worsening pain in the right hip over the past week. EXAM: RIGHT FEMUR PORTABLE 2 VIEW COMPARISON:  Right hip radiographs dated 11/12/2022. FINDINGS: The patient is status post  placement of a femoral shaft intramedullary nail and femoral neck screw on the right. Compared to 11/12/2022, the femoral neck screw has advanced with the tip now extending slightly beyond the right femoral head and into the right hip joint space. No new fracture is identified, however there is suggestion of compression of the intertrochanteric fracture of the right femur compared to prior exam. A displaced fracture fragment of the right lesser trochanter is redemonstrated. IMPRESSION: Suggestion of compression of the intertrochanteric fracture of  the right femur compared to prior exam as well as interval advancement of the femoral neck screw with the tip now extending slightly beyond the right femoral head and into the right hip joint space. Electronically Signed   By: Romona Curls M.D.   On: 01/08/2023 15:01   DG Hip Unilat W or Wo Pelvis 2-3 Views Right  Result Date: 01/08/2023 CLINICAL DATA:  Right hip surgery in June with worsening pain in the right hip over the past week. EXAM: DG HIP (WITH OR WITHOUT PELVIS) 2-3V RIGHT COMPARISON:  Right hip radiographs dated 11/12/2022. FINDINGS: The patient is status post placement of a femoral shaft intramedullary nail and femoral neck screw on the right. Compared to 11/12/2022, the femoral neck screw has advanced with the tip now extending slightly beyond the right femoral head and into the right hip joint space. No new fracture is identified, however there is suggestion of compression of the intertrochanteric fracture of the right femur compared to prior exam. A displaced fracture fragment of the right lesser trochanter is redemonstrated. IMPRESSION: Suggestion of compression of the intertrochanteric fracture of the right femur compared to prior exam as well as interval advancement of the femoral neck screw with the tip now extending slightly beyond the right femoral head and into the right hip joint space. Electronically Signed   By: Romona Curls M.D.   On: 01/08/2023 15:00     Assessment and Plan:   Preoperative evaluation for right hip fracture (surgical revision due to migrating nail) Patient has extensive cardiac history that puts him at high risk however his conditions seem to be stable and overall without symptoms.  Not able to calculate METS however he does seem functionally capable and without interference of ADLs despite having known CAD status post CABG and history of heart failure that has now normalized.  RCRI indicates class IV risk with 15% chance of MACE. There is little to do from a  cardiac perspective to further optimize him for his upcoming procedure.  Likely can proceed as long as there is no abnormal findings on echocardiogram.  Will discuss with MD about any further recommendations. Continue to hold Plavix as needed perioperatively  Hx HFmrEF Valvular disease Chronic hypotension on midodrine Per chart review he had echocardiogram in April 2023 that showed normalization of EF 55%.  He does have extensive valvular disease however this has been managed conservatively.  GDMT limited by ESRD and hypotension and chronically on midodrine.  He is on 40 of Lasix p.o. daily, uncertain whether he really needs this given volume status is managed by dialysis. Hold this for now.   Paroxysmal atrial fibrillation Currently in A-fib right now with heart rates in the 60s.  Not on anticoagulation due to recurrent falls and bleeding risk.  Managed on sotalol.  Hypotensive and heart rates are low, would recommend holding for now.  History of dissecting aortic aneurysm Per patient's daughter this is a chronic finding and did not require any intervention.  Hypothyroidism Recent COVID  Diabetes ESRD Per primary team   Risk Assessment/Risk Scores:   CHA2DS2-VASc Score = 6  1} This indicates a 9.7% annual risk of stroke. The patient's score is based upon: CHF History: 1 HTN History: 1 Diabetes History: 1 Stroke History: 0 Vascular Disease History: 1 Age Score: 2 Gender Score: 0    For questions or updates, please contact [facility] Please consult [email or web address] for contact info under    Signed, Rollene Rotunda, MD  01/11/2023 2:23 PM   History and all data above reviewed.  Patient examined.  I agree with the findings as above.    The patient is known to another cardiology practice.  He has a history 3 of chronic atrial fibrillation.  He is not on anticoagulation because of fall risk.  He has had a history of CABG and stenting.  He had a reduced ejection fraction  although it is improved as above.  He does have a history of ascending aortic aneurysm.  There is a mention by his family of chronic aortic dissection which I do not see documented.  He does have some moderate aortic insufficiency and tricuspid regurgitation.  He is dialysis dependent renal failure.  Despite this he apparently gets around fairly well before he injured himself.  He says he would walk with a walker for balance.  He did his own self-care.  He would tolerate dialysis.  He does not report chest discomfort or shortness of breath or PND that is acute.  He does not notice his atrial fibrillation.  Apparently this has been paroxysmal although it looks to be persisting currently.  He was in sinus in June of this year.  The patient exam reveals COR: Irregular,  Lungs: Clear to auscultation bilaterally,  Abd: Positive bowel sounds normal frequency pitch, bruits, rebound, guarding, Ext 2+ pulses, trace edema.  All available labs, radiology testing, previous records reviewed. Agree with documented assessment and plan.  Preop patient is at an elevated risk for cardiovascular complications given his extensive past history and his age and decreased mobility.  However, this is understood by the patient and the family and the surgery is necessary.  There is no preoperative testing or change in therapy that we will mitigate that risk although we will check an echocardiogram.  Atrial fib:  OK to stop Sotalol with bradycardia.  Likely can discharge off of this since he does not seem to be symptomatic with his fibrillation and it does not appear to be keeping him from prolonged paroxysms so it is not reducing his thromboembolic risk.  Fayrene Fearing Shemicka Cohrs  2:24 PM  01/11/2023

## 2023-01-11 NOTE — Progress Notes (Addendum)
Orthopaedic Trauma Service   Pt seen and evaluated  Full consult to follo Family at bedside as well  Pt 87 months s/p IMN R intertrochanteric femur fracture who presented with [facility] with increasing hip pain on 01/08/2023.  Imaging shows screw cutout of the proximal lag screw with penetration into the hip joint.  Pt transferred to [facility] for further treatment.   Pt ambulates with rollator/walker at baseline He lives with his daughter and son-in-law but is very independent around the home   His activity has been severely restricted these last several months due to his hip fracture He was in the hospital about 8 days after his fracture surgery then went to a SNF for about 3 weeks and was discharged the first week of August.  His las week at the SNF he did not have any therapy as he was in isolation for COVID   Multiple treatment options were discussed (including risks and benefits) with the pt and family including hardware exchange or hip arthroplasty.  After lengthy discussion and review of imaging the family and pt wish to proceed with conversion to arthroplasty  Plan for OR tomorrow with Dr. Blanchie Dessert This info has been relayed to the hospitalist and nephrology services  Nephrology requests update on surgery time when available to arrange dialysis   Renal/carb mod diet ordered for today NPO after MN   Mearl Latin, PA-C [phone] (C) 01/11/2023, 11:21 AM  [facility] [address] [place] [address] [phone] Collier Bullock (F)      Patient ID: Matthew [name], male   DOB: 25-Feb-1931, 89 y.o.   MRN: [id number]

## 2023-01-11 NOTE — Progress Notes (Signed)
Echocardiogram 2D Echocardiogram has been performed.  [name] 01/11/2023, 5:49 PM

## 2023-01-11 NOTE — Consult Note (Signed)
   [facility] Inpatient Consult   01/11/2023  Matthew [name] [birth year] [id number]  [facility] [[facility]]  Accountable Care Organization [ACO] Patient: Medicare ACO REACH  Primary Care Provider: Sherron Monday, MD  Referral:  [facility] Liaison [facility]   Patient at [facility] from [facility] for potential surgery noted.  Plan: Will follow for post hospital transitional needs when medically ready.    Of note, [facility] Care Management services does not replace or interfere with any services that are arranged by inpatient case management or social work.  For additional questions or referrals please contact:    Charlesetta Shanks, RN BSN CCM [place]  [facility]  [phone] business mobile phone Toll free office [phone]  Fax number: [phone] [email or web address]

## 2023-01-11 NOTE — Progress Notes (Signed)
Pt receives out-pt HD at [facility] [address]) on MWF. Pt arrives at 10:45 for 11:00 chair time. Will assist as needed.   Olivia Canter Renal Navigator [phone]

## 2023-01-11 NOTE — Consult Note (Incomplete)
Orthopaedic Trauma Service (OTS) Consult   Patient ID: Matthew [name] MRN: [id number] DOB/AGE: 08/12/1930 89 y.o.   Reason for Consult:*** Referring Physician: ***   HPI: Matthew [name] is an 89 y.o. male ***  Past Medical History:  Diagnosis Date   Anginal pain (HCC)    Arthritis    Ascending aortic aneurysm (HCC) 07/18/2015   a.) TTE 07/18/2015: severe dilation of ascending aorta measuring 7.0 cm. b.) TTE 01/10/2018: aortic root 3.8 cm; ascending aorta 6.8 cm; refused surgical intervention/repair.   Atrial fibrillation (HCC)    a.) CHA2DS2-VASc = 6 (age x 2, HFrEF, HTN, previous MI, T2DM). b.) rate/rhythm maintained on amiodarone + metoprolol succinate; chronic antiplatelet therapy using clopidogrel   Bradycardia    Broken arm 05/2013   Left   Cardiac murmur    a.) RIGHT upper sternal border   Cardiomyopathy [facility])    Coronary artery disease    DOE (dyspnea on exertion)    ESRD (end stage renal disease) (HCC)    GERD (gastroesophageal reflux disease)    HFrEF (heart failure with reduced ejection fraction) (HCC)    a.) TTE 12/03/2013: mod dec LV function; EF 35-40%; severe inferolateral and inferior HK; LA dilated; G3DD; PASP 52 mmHg. b.) TTE 07/18/2015: mild LV dysfunction with mild LVH; EF 45%; mild BAE. c.) TTE 01/10/2018: mod LV dysfunction; EF 45%; diffuse HK   HLD (hyperlipidemia)    Hypertension    IDA (iron deficiency anemia)    Long term current use of antithrombotics/antiplatelets    a.) clopidogrel   Myocardial infarction (HCC) 2000   Peripheral edema    Pneumonia    S/P CABG x 4 2001   a.) LIMA-LAD, SVG-D1, SVG-OM1, SVG-OM2, SVG-PDA   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   left frontal scalp, EDC   Squamous cell carcinoma of skin 05/05/2020   left temporal scalp, in situ, EDC 05/13/20   T2DM (type 2 diabetes mellitus) (HCC)    Valvular insufficiency 12/03/2013   a.) TTE 12/03/2013: EF 35-40%; mild AR/MR, b.) TTE 07/18/2015: EF 45%;  mild AR/PR, mod TR, severe MR. c.) TTE 01/10/2018: mod AR/TR    Past Surgical History:  Procedure Laterality Date   A/V SHUNTOGRAM Left 04/01/2022   Procedure: A/V SHUNTOGRAM;  Surgeon: Annice Needy, MD;  Location: [facility] INVASIVE CV LAB;  Service: Cardiovascular;  Laterality: Left;   AV FISTULA PLACEMENT Left 04/15/2021   Procedure: INSERTION OF ARTERIOVENOUS (AV) GORE-TEX GRAFT ARM ( BRACHIAL AXILLARY);  Surgeon: Annice Needy, MD;  Location: [facility] ORS;  Service: Vascular;  Laterality: Left;   CATARACT EXTRACTION W/PHACO Left 07/26/2017   Procedure: CATARACT EXTRACTION PHACO AND INTRAOCULAR LENS PLACEMENT (IOC);  Surgeon: Galen Manila, MD;  Location: [facility] ORS;  Service: Ophthalmology;  Laterality: Left;  [place]   00:45.8 AP%  13.1 CDE  6.00 Fluid Pack Lot # [id number]   CATARACT EXTRACTION W/PHACO Right 08/17/2017   Procedure: CATARACT EXTRACTION PHACO AND INTRAOCULAR LENS PLACEMENT (IOC);  Surgeon: Galen Manila, MD;  Location: [facility] ORS;  Service: Ophthalmology;  Laterality: Right;  [place]  01:30 AP% 16.8 CDE 15.24 Fluid pack lot # [id number]   CHOLECYSTECTOMY     CORONARY ARTERY BYPASS GRAFT N/A 2001   Procedure: 5v CABG (LIMA-LAD, SVG-D1, SVG-OM1, SVG-OM2, SVG-PDA)   DIALYSIS/PERMA CATHETER INSERTION N/A 02/09/2018   Procedure: DIALYSIS/PERMA CATHETER INSERTION;  Surgeon: Annice Needy, MD;  Location: [facility] INVASIVE CV LAB;  Service: Cardiovascular;  Laterality: N/A;   DIALYSIS/PERMA CATHETER REMOVAL N/A 06/25/2021   Procedure: DIALYSIS/PERMA CATHETER REMOVAL;  Surgeon: Annice Needy, MD;  Location: [facility] INVASIVE CV LAB;  Service: Cardiovascular;  Laterality: N/A;   FRACTURE SURGERY Left 2015   LEFT arm   INSERTION OF DIALYSIS CATHETER Right 11/03/2020   Procedure: INSERTION OF Perm Cath in the RIGHT INTERNAL JUGULAR;  Surgeon: Annice Needy, MD;  Location: [facility] ORS;  Service: Vascular;  Laterality: Right;   INTRAMEDULLARY (IM) NAIL INTERTROCHANTERIC Right 11/12/2022   Procedure: INTRAMEDULLARY  (IM) NAIL INTERTROCHANTERIC;  Surgeon: Juanell Fairly, MD;  Location: [facility] ORS;  Service: Orthopedics;  Laterality: Right;   LEFT HEART CATHETERIZATION WITH CORONARY/GRAFT ANGIOGRAM Left 12/04/2013   Procedure: LEFT HEART CATHETERIZATION WITH Isabel Caprice;  Surgeon: Marykay Lex, MD;  Location[facility] CATH LAB;  Service: Cardiovascular;  Laterality: Left;   LEFT HEART CATHETERIZATION WITH CORONARY/GRAFT ANGIOGRAM Left 02/06/2009   Procedure: LEFT HEART CATHETERIZATION WITH CORONARY/GRAFT ANGIOGRAM; Location: [facility]; Surgeon: Despina Hick, MD   PERCUTANEOUS CORONARY STENT INTERVENTION (PCI-S) N/A 12/06/2013   Procedure: STAGED PERCUTANEOUS CORONARY STENT INTERVENTION (overlapping 4.0 x 38 mm (mid) and 4.0 x 28 mm (ostial) Promus Primier DES to SVG-RPDA graft);  Surgeon: Lesleigh Noe, MD;  Location: [facility] CATH LAB;  Service: Cardiovascular   REMOVAL OF A DIALYSIS CATHETER N/A 11/03/2020   Procedure: REMOVAL OF A DIALYSIS CATHETER;  Surgeon: Annice Needy, MD;  Location: [facility] ORS;  Service: Vascular;  Laterality: N/A;    Family History  Problem Relation Age of Onset   Heart attack Father 16       died first MI at age 74   Heart failure Mother     Social History:  reports that he quit smoking about 32 years ago. His smoking use included pipe and cigarettes. He has never used smokeless tobacco. He reports that he does not currently use alcohol after a past usage of about 1.0 standard drink of alcohol per week. He reports that he does not use drugs.  Allergies:  Allergies  Allergen Reactions   Other     Other reaction(s): Myalgias (intolerance), Other (See Comments) Statin Drugs  Statin Drugs     Amoxicillin Diarrhea    Medications: {medication reviewed/display:3041432}  Results for orders placed or performed during the hospital encounter of 01/10/23 (from the past 48 hour(s))  Type and screen [facility]     Status: None   Collection Time: 01/10/23 10:10 PM   Result Value Ref Range   ABO/RH(D) O POS    Antibody Screen NEG    Sample Expiration      01/13/2023,2359 Performed at [facility] Lab, [address] [place], [place] [address]   Comprehensive metabolic panel     Status: Abnormal   Collection Time: 01/10/23 10:14 PM  Result Value Ref Range   Sodium 134 (L) 135 - 145 mmol/L   Potassium 4.4 3.5 - 5.1 mmol/L   Chloride 92 (L) 98 - 111 mmol/L   CO2 28 22 - 32 mmol/L   Glucose, Bld 123 (H) 70 - 99 mg/dL    Comment: Glucose reference range applies only to samples taken after fasting for at least 8 hours.   BUN 46 (H) 8 - 23 mg/dL   Creatinine, Ser 1.32 (H) 0.61 - 1.24 mg/dL   Calcium 8.8 (L) 8.9 - 10.3 mg/dL   Total Protein 6.5 6.5 - 8.1 g/dL   Albumin 2.7 (L) 3.5 - 5.0 g/dL   AST  27 15 - 41 U/L   ALT 24 0 - 44 U/L   Alkaline Phosphatase 236 (H) 38 - 126 U/L   Total Bilirubin 0.4 0.3 - 1.2 mg/dL   GFR, Estimated 22 (L) >60 mL/min    Comment: (NOTE) Calculated using the CKD-EPI Creatinine Equation (2021)    Anion gap 14 5 - 15    Comment: Performed at [facility] Lab, [address], [place], [place] [address]  Magnesium     Status: None   Collection Time: 01/10/23 10:14 PM  Result Value Ref Range   Magnesium 1.9 1.7 - 2.4 mg/dL    Comment: Performed at [facility] Lab, [address] [place], [place] [address]  Phosphorus     Status: None   Collection Time: 01/10/23 10:14 PM  Result Value Ref Range   Phosphorus 3.4 2.5 - 4.6 mg/dL    Comment: Performed at [facility] Lab, [address], [place], [place] [address]  TSH     Status: None   Collection Time: 01/10/23 10:14 PM  Result Value Ref Range   TSH 4.027 0.350 - 4.500 uIU/mL    Comment: Performed by a 3rd Generation assay with a functional sensitivity of <=0.01 uIU/mL. Performed at [facility] Lab, [address] [place], [place] [address]   VITAMIN D 25 Hydroxy (Vit-D Deficiency, Fractures)     Status: None   Collection Time: 01/10/23 10:14 PM   Result Value Ref Range   Vit D, 25-Hydroxy 45.90 30 - 100 ng/mL    Comment: (NOTE) Vitamin D deficiency has been defined by the Institute of Medicine  and an Endocrine Society practice guideline as a level of serum 25-OH  vitamin D less than 20 ng/mL (1,2). The Endocrine Society went on to  further define vitamin D insufficiency as a level between 21 and 29  ng/mL (2).  1. IOM (Institute of Medicine). 2010. Dietary reference intakes for  calcium and D. [place]: [employer]. 2. Holick MF, Binkley [place], Bischoff-Ferrari HA, et al. Evaluation,  treatment, and prevention of vitamin D deficiency: an Endocrine  Society clinical practice guideline, JCEM. 2011 Jul; 96(7): 1911-30.  Performed at [facility] Lab, [address] [place], [place] [address]   Hemoglobin A1c     Status: Abnormal   Collection Time: 01/10/23 10:14 PM  Result Value Ref Range   Hgb A1c MFr Bld 6.0 (H) 4.8 - 5.6 %    Comment: (NOTE) Pre diabetes:          5.7%-6.4%  Diabetes:              >6.4%  Glycemic control for   <7.0% adults with diabetes    Mean Plasma Glucose 125.5 mg/dL    Comment: Performed at [facility] Lab, [address] [place], [place] [address]  Vitamin B12     Status: None   Collection Time: 01/10/23 10:16 PM  Result Value Ref Range   Vitamin B-12 782 180 - 914 pg/mL    Comment: (NOTE) This assay is not validated for testing neonatal or myeloproliferative syndrome specimens for Vitamin B12 levels. Performed at [facility] Lab, [address], [place], [place] [address]   Folate     Status: None   Collection Time: 01/10/23 10:16 PM  Result Value Ref Range   Folate 35.6 >5.9 ng/mL    Comment: Performed at [facility] Lab, [address], [place], [place] [address]  Iron  and TIBC     Status: Abnormal   Collection Time: 01/10/23 10:16 PM  Result Value Ref Range   Iron 29 (L) 45 - 182 ug/dL   TIBC 829 562 - 130 ug/dL   Saturation Ratios 11 (L) 17.9 -  39.5 %   UIBC 226 ug/dL    Comment: Performed at [facility] Lab, [address] [place], [place] [address]  Ferritin     Status: Abnormal   Collection Time: 01/10/23 10:16 PM  Result Value Ref Range   Ferritin 920 (H) 24 - 336 ng/mL    Comment: Performed at [facility] Lab, [address], [place], [place] [address]  Reticulocytes     Status: Abnormal   Collection Time: 01/10/23 10:16 PM  Result Value Ref Range   Retic Ct Pct 2.2 0.4 - 3.1 %   RBC. 3.47 (L) 4.22 - 5.81 MIL/uL   Retic Count, Absolute 76.7 19.0 - 186.0 K/uL   Immature Retic Fract 19.6 (H) 2.3 - 15.9 %    Comment: Performed at [facility] Lab, [address], [place], [place] [address]  Glucose, capillary     Status: Abnormal   Collection Time: 01/10/23 11:07 PM  Result Value Ref Range   Glucose-Capillary 115 (H) 70 - 99 mg/dL    Comment: Glucose reference range applies only to samples taken after fasting for at least 8 hours.  CBC     Status: Abnormal   Collection Time: 01/11/23 12:19 AM  Result Value Ref Range   WBC 5.1 4.0 - 10.5 K/uL   RBC 3.47 (L) 4.22 - 5.81 MIL/uL   Hemoglobin 11.2 (L) 13.0 - 17.0 g/dL   HCT 13.2 (L) 44.0 - 10.2 %   MCV 99.7 80.0 - 100.0 fL   MCH 32.3 26.0 - 34.0 pg   MCHC 32.4 30.0 - 36.0 g/dL   RDW 72.5 36.6 - 44.0 %   Platelets 129 (L) 150 - 400 K/uL   nRBC 0.0 0.0 - 0.2 %    Comment: Performed at [facility] Lab, [address] [place], [place] [address]  Comprehensive metabolic panel     Status: Abnormal   Collection Time: 01/11/23 12:19 AM  Result Value Ref Range   Sodium 133 (L) 135 - 145 mmol/L   Potassium 4.7 3.5 - 5.1 mmol/L   Chloride 94 (L) 98 - 111 mmol/L   CO2 29 22 - 32 mmol/L   Glucose, Bld 131 (H) 70 - 99 mg/dL    Comment: Glucose reference range applies only to samples taken after fasting for at least 8 hours.   BUN 48 (H) 8 - 23 mg/dL   Creatinine, Ser 5.95 (H) 0.61 - 1.24 mg/dL   Calcium 8.7 (L) 8.9 - 10.3 mg/dL   Total Protein 6.6 6.5 - 8.1 g/dL    Albumin 2.7 (L) 3.5 - 5.0 g/dL   AST 25 15 - 41 U/L   ALT 24 0 - 44 U/L   Alkaline Phosphatase 250 (H) 38 - 126 U/L   Total Bilirubin 0.7 0.3 - 1.2 mg/dL   GFR, Estimated 22 (L) >60 mL/min    Comment: (NOTE) Calculated using the CKD-EPI Creatinine Equation (2021)    Anion gap 10 5 - 15    Comment: Performed at [facility] Lab, [address], [place], [place] [address]  Glucose, capillary     Status: Abnormal   Collection Time: 01/11/23  4:47 AM  Result Value Ref Range  Glucose-Capillary 68 (L) 70 - 99 mg/dL    Comment: Glucose reference range applies only to samples taken after fasting for at least 8 hours.  Surgical pcr screen     Status: None   Collection Time: 01/11/23  5:10 AM   Specimen: Nasal Mucosa; Nasal Swab  Result Value Ref Range   MRSA, PCR NEGATIVE NEGATIVE   Staphylococcus aureus NEGATIVE NEGATIVE    Comment: (NOTE) The Xpert SA Assay (FDA approved for NASAL specimens in patients 14 years of age and older), is one component of a comprehensive surveillance program. It is not intended to diagnose infection nor to guide or monitor treatment. Performed at [facility] Lab, [address], [place], [place] [address]   Glucose, capillary     Status: Abnormal   Collection Time: 01/11/23  5:33 AM  Result Value Ref Range   Glucose-Capillary 135 (H) 70 - 99 mg/dL    Comment: Glucose reference range applies only to samples taken after fasting for at least 8 hours.  Glucose, capillary     Status: Abnormal   Collection Time: 01/11/23  7:50 AM  Result Value Ref Range   Glucose-Capillary 110 (H) 70 - 99 mg/dL    Comment: Glucose reference range applies only to samples taken after fasting for at least 8 hours.    DG CHEST PORT 1 VIEW  Result Date: 01/10/2023 CLINICAL DATA:  Preop cardiovascular exam EXAM: PORTABLE CHEST 1 VIEW COMPARISON:  11/12/2022 FINDINGS: Prior CABG. Cardiomegaly. Aortic atherosclerosis. Vascular congestion. No overt edema. Linear atelectasis  in the left mid lung. No visible effusions. No acute bony abnormality. IMPRESSION: Cardiomegaly, vascular congestion. Left mid lung subsegmental atelectasis. Electronically Signed   By: Charlett Nose M.D.   On: 01/10/2023 22:55   CT HIP RIGHT WO CONTRAST  Result Date: 01/10/2023 CLINICAL DATA:  Hip replacement, loosening suspected Hip surgical planning EXAM: CT OF THE RIGHT HIP WITHOUT CONTRAST TECHNIQUE: Multidetector CT imaging of the right hip was performed according to the standard protocol. Multiplanar CT image reconstructions were also generated. RADIATION DOSE REDUCTION: This exam was performed according to the departmental dose-optimization program which includes automated exposure control, adjustment of the mA and/or kV according to patient size and/or use of iterative reconstruction technique. COMPARISON:  X-ray 01/08/2023, 11/12/2022 FINDINGS: Bones/Joint/Cartilage Postsurgical changes from right hip ORIF associated with a intertrochanteric fracture of the proximal right femur. Fracture lines remain visible. There is mild periosteal new bone formation at the fracture location. Femoral neck lag screw has migrated within the femoral head and the distal tip is now positioned intra-articularly extending 2 mm beyond the femoral head cortex. No definite new fracture. Hip joint alignment is maintained without dislocation. Mild to moderate right hip joint osteoarthritis. Old healed fractures of the right superior and inferior pubic rami. Ligaments Suboptimally assessed by CT. Muscles and Tendons No acute musculotendinous abnormality by CT. Soft tissues Nonspecific soft tissue swelling/edema over the lateral aspect of the right hip and proximal thigh. No organized fluid collections. Atherosclerotic vascular calcification. No right inguinal lymphadenopathy. IMPRESSION: 1. Postsurgical changes from right hip ORIF associated with a intertrochanteric fracture of the proximal right femur. Fracture lines remain  visible. There is mild periosteal new bone formation at the fracture location. 2. Femoral neck lag screw has migrated within the femoral head and the distal tip is now positioned intra-articularly. 3. Mild to moderate right hip joint osteoarthritis. 4. Old healed fractures of the right superior and inferior pubic rami. 5. Nonspecific soft tissue swelling/edema over the  lateral aspect of the right hip and proximal thigh. No organized fluid collections. Electronically Signed   By: Duanne Guess D.O.   On: 01/10/2023 11:44    Intake/Output      08/19 0701 08/20 0700 08/20 0701 08/21 0700   P.O.  0   Total Intake(mL/kg)  0 (0)   Urine (mL/kg/hr) 150    Stool 0    Total Output 150    Net -150 0        Stool Occurrence 1 x       ROS Blood pressure (!) 88/54, pulse 85, temperature 97.6 F (36.4 C), temperature source Oral, resp. rate 17, height 6\' 1"  (1.854 m), weight 98.2 kg, SpO2 100%. Physical Exam Ortho Exam         Gait: *** Coordination and balance: ***   Assessment/Plan:  ***  -(ortho injury):  - Pain management:  *** - ABL anemia/Hemodynamics  *** - Medical issues   *** - DVT/PE prophylaxis:  *** - ID:   *** - Metabolic Bone Disease:  *** - Activity:  *** - FEN/GI prophylaxis/Foley/Lines:  *** -Ex-fix/Splint care:  *** - Impediments to fracture healing:  *** - Dispo:  ***  {ORTHOADMISSIONSTATUS:21269}  Weightbearing: {weightbearing/status:21254} {affected extremity :21255} Insicional and dressing care: {Insicional and dressing care:21256} Orthopedic device(s): {CHL orthopedic devices:21267::"None"} Showering: *** VTE prophylaxis: {Type:21257} {Duration:21258} Pain control: *** Follow - up plan: {Follow-up plan:21259} Contact information:  *** MD, *** PA   Mearl Latin, PA-C [phone] (C) 01/11/2023, 11:29 AM  [facility] [address] [place] [address] [phone] [place]) [phone] (F)    After 5pm and  on the weekends please log on to Amion, go to orthopaedics and the look under the [facility] Call for the provider(s) on call. You can also call our office at [phone] and then follow the prompts to be connected to the call team.

## 2023-01-11 NOTE — Progress Notes (Signed)
PROGRESS NOTE Matthew [name]  UJW:[id number] DOB: 02/18/1931 DOA: 01/10/2023 PCP: Sherron Monday, MD  Brief Narrative/Hospital Course: 89 y.o.M W/ recent history of right hip fracture s/p intramedullary rod and femoral neck screw ESRD on HD MWF,A-fib, CAD, systolic CHF, type 2 diabetes, osteoporosis, HLD, PAF not on anticoagulation due to fall risk, chronic orthostatic hypotension on midodrine, CAD/history of CABG, HFmrEF, hypothyroidism, insomnia,hx of aortic dissection, presented with right hip pain and admitted to [facility] from 8/17 to 8/19 and transferred to [facility] for to be evaluated by Dr. Carola Frost for migration of the femoral nail for possible surgical revision.    Subjective: Seen and examined Daughter and SIL at bedside No sp/sob no complaints hadBM after laxatives last night Overnight afebrile BP 88-97, was also hypoglycemic up to 68 needing IV dextrose  Assessment and Plan: Principal Problem:   Closed right hip fracture (HCC) Active Problems:   Closed hip fracture requiring operative repair, right, sequela   End-stage renal disease on hemodialysis (HCC)   Chronic hypotension   Atrial fibrillation (HCC)   CAD (coronary artery disease)   HFrEF (heart failure with reduced ejection fraction) (HCC)   Hypothyroidism   Chronic systolic CHF (congestive heart failure) (HCC)   Dissecting aortic aneurysm, thoracic (HCC)   Recent right hip fracture s/p intramedullary rod and femoral neck screw Right hip pain with migration of femoral nail: Dr. Carola Frost has been consulted and transferred from [facility] to [facility] for surgical revision. S/p CT hip at [facility].  Echo has been ordered preop Plavix has been held, given patient's cardiac history, cardiology has been consulted for preop eval.  ESRD on HD MWF Anemia CKD MBD: Had HD 8/19, nephro consult for HD 8/21. Trend ca, mag phos hb. Hb in 11 gm now Recent Labs    11/12/22 0404 11/13/22 0522 11/15/22 0832 11/17/22 0436 11/18/22 0543  11/19/22 0610 01/08/23 1358 01/10/23 0408 01/10/23 2214 01/11/23 0019  BUN 87* 85* 82* 55* 39* 58* 65* 85* 46* 48*  CREATININE 3.93* 3.72* 3.72* 3.32* 2.38* 2.98* 2.93* 3.76* 2.72* 2.72*  CO2 27 18* 26 30 30 28 28 28 28 29    TDM with hypoglycemia: Hold home glipizide, keep on SSI. Recent Labs  Lab 01/10/23 2214 01/10/23 2307 01/11/23 0447 01/11/23 0533 01/11/23 0750 01/11/23 1210  GLUCAP  --  115* 68* 135* 110* 77  HGBA1C 6.0*  --   --   --   --   --     Chronic hypotension: Continues midodrine.  History of dissecting aortic aneurysm  CAD status post CABG: Asymptomatic.Intolerant to statin.  Home Plavix on hold for surgery  HFmrEF: Monitor volume status. Echo has been ordered preop  PAF not on anticoagulation due to fall risk: Continue sotalol.  Hypothyroidism: Continue home Synthroid  Insomnia: On suvorexant  Recent COVID complicating his rehab stay and had treatment with Paxlovid and went home on August 3  Hypoalbuminemia: RD consult augment diet  Pressure injury coccyx stage II POA see below Pressure Injury 01/10/23 Coccyx Mid;Lower Stage 2 -  Partial thickness loss of dermis presenting as a shallow open injury with a red, pink wound bed without slough. 2 small areas on sacrum (Active)  01/10/23 2100  Location: Coccyx  Location Orientation: Mid;Lower  Staging: Stage 2 -  Partial thickness loss of dermis presenting as a shallow open injury with a red, pink wound bed without slough.  Wound Description (Comments): 2 small areas on sacrum  Present on Admission: Yes  Dressing Type Foam - Lift  dressing to assess site every shift 01/10/23 2200   DVT prophylaxis: SCDs Start: 01/10/23 2214 Code Status:   Code Status: DNR Family Communication: plan of care discussed with patient/daughter at bedside. Patient status is: Inpatient because of pain in rt hip needing surgical revision Level of care: Telemetry Medical   Dispo:The patient is from: home with daughter,  independent with mobility            Anticipated disposition: SNF  Objective: Vitals last 24 hrs: Vitals:   01/10/23 2100 01/11/23 0442 01/11/23 0749  BP:  97/63 (!) 88/54  Pulse:  78 85  Resp:   17  Temp:  (!) 97.5 F (36.4 C) 97.6 F (36.4 C)  TempSrc:  Oral Oral  SpO2:  95% 100%  Weight: 98.2 kg    Height: 6\' 1"  (1.854 m)     Weight change:   Physical Examination: General exam: alert awake, older than stated age HEENT:Oral mucosa moist, Ear/Nose WNL grossly Respiratory system: bilaterally clear BS, no use of accessory muscle Cardiovascular system: S1 & S2 +, No JVD. Gastrointestinal system: Abdomen soft,NT,ND, BS+ Nervous System:Alert, awake, moving extremities. Extremities: LE edema neg,distal peripheral pulses palpable.  Skin: No rashes,no icterus. MSK: Normal muscle bulk,tone, power LUE w/ avf+  Medications reviewed:  Scheduled Meds:  Chlorhexidine Gluconate Cloth  6 each Topical Q0600   insulin aspart  0-9 Units Subcutaneous Q4H   levothyroxine  88 mcg Oral Q0600   [START ON 01/12/2023] midodrine  10 mg Oral Q M,W,F-HD   midodrine  5 mg Oral TID WC   multivitamin  1 tablet Oral QHS   sotalol  80 mg Oral BID   zolpidem  5 mg Oral QHS   Continuous Infusions:  methocarbamol (ROBAXIN) IV     Diet Order             Diet NPO time specified  Diet effective midnight           Diet Carb Modified Fluid consistency: Thin; Room service appropriate? Yes  Diet effective now                  Intake/Output Summary (Last 24 hours) at 01/11/2023 1214 Last data filed at 01/11/2023 1212 Gross per 24 hour  Intake 50 ml  Output 200 ml  Net -150 ml   Net IO Since Admission: -150 mL [01/11/23 1214]  Wt Readings from Last 3 Encounters:  01/10/23 98.2 kg  01/10/23 91 kg  11/19/22 99.9 kg     Unresulted Labs (From admission, onward)     Start     Ordered   01/12/23 0500  Basic metabolic panel  Daily,   R      01/11/23 0843   01/12/23 0500  CBC  Daily,   R       01/11/23 0843   01/11/23 1113  Hepatitis B surface antigen  (New Admission Hemo Labs (Hepatitis B))  Once,   R        01/11/23 1113   01/11/23 1113  Hepatitis B surface antibody,quantitative  (New Admission Hemo Labs (Hepatitis B))  Once,   R        01/11/23 1113          Data Reviewed: I have personally reviewed following labs and imaging studies CBC: Recent Labs  Lab 01/08/23 1358 01/10/23 0929 01/11/23 0019  WBC 6.0 6.0 5.1  NEUTROABS 4.2  --   --   HGB 11.2* 10.5* 11.2*  HCT 35.5* 32.8* 34.6*  MCV 99.7 98.2 99.7  PLT 131* 130* 129*   Basic Metabolic Panel: Recent Labs  Lab 01/08/23 1358 01/10/23 0408 01/10/23 2214 01/11/23 0019  NA 134* 134* 134* 133*  K 4.4 4.7 4.4 4.7  CL 94* 94* 92* 94*  CO2 28 28 28 29   GLUCOSE 154* 116* 123* 131*  BUN 65* 85* 46* 48*  CREATININE 2.93* 3.76* 2.72* 2.72*  CALCIUM 8.7* 8.8* 8.8* 8.7*  MG  --   --  1.9  --   PHOS  --   --  3.4  --   GFR: Estimated Creatinine Clearance: 22.3 mL/min (A) (by C-G formula based on SCr of 2.72 mg/dL (H)). Liver Function Tests: Recent Labs  Lab 01/10/23 2214 01/11/23 0019  AST 27 25  ALT 24 24  ALKPHOS 236* 250*  BILITOT 0.4 0.7  PROT 6.5 6.6  ALBUMIN 2.7* 2.7*   Recent Labs    01/10/23 2214  HGBA1C 6.0*   CBG: Recent Labs  Lab 01/10/23 2307 01/11/23 0447 01/11/23 0533 01/11/23 0750 01/11/23 1210  GLUCAP 115* 68* 135* 110* 77  Lipid Profile: No results for input(s): "CHOL", "HDL", "LDLCALC", "TRIG", "CHOLHDL", "LDLDIRECT" in the last 72 hours. Thyroid Function Tests: Recent Labs    01/10/23 2214  TSH 4.027   Sepsis Labs: No results for input(s): "PROCALCITON", "LATICACIDVEN" in the last 168 hours.  Recent Results (from the past 240 hour(s))  Surgical pcr screen     Status: None   Collection Time: 01/11/23  5:10 AM   Specimen: Nasal Mucosa; Nasal Swab  Result Value Ref Range Status   MRSA, PCR NEGATIVE NEGATIVE Final   Staphylococcus aureus NEGATIVE NEGATIVE Final     Comment: (NOTE) The Xpert SA Assay (FDA approved for NASAL specimens in patients 55 years of age and older), is one component of a comprehensive surveillance program. It is not intended to diagnose infection nor to guide or monitor treatment. Performed at [facility], [address], [place], [place] [address]     Antimicrobials: Anti-infectives (From admission, onward)    None      Culture/Microbiology    Component Value Date/Time   SDES BLOOD BLOOD LEFT ARM 05/24/2019 0021   SDES BLOOD BLOOD LEFT HAND 05/24/2019 0021   SDES  05/24/2019 0021    IN/OUT CATH URINE Performed at [facility], [address] [place], [place] [address]    [facility]  05/24/2019 0021    BOTTLES DRAWN AEROBIC AND ANAEROBIC Blood Culture adequate volume   SPECREQUEST  05/24/2019 0021    BOTTLES DRAWN AEROBIC AND ANAEROBIC Blood Culture adequate volume   SPECREQUEST  05/24/2019 0021    NONE Performed at [facility], [address] [place], [place] [address]    CULT  05/24/2019 0021    NO GROWTH 5 DAYS Performed at [facility], [address] [place] [place], [place] [address]    CULT  05/24/2019 0021    NO GROWTH 5 DAYS Performed at [facility], [address] [place] [place], [place] [address]    CULT  05/24/2019 0021    NO GROWTH Performed at [facility], [address] [place], [place] [address]    REPTSTATUS 05/29/2019 FINAL 05/24/2019 0021   REPTSTATUS 05/29/2019 FINAL 05/24/2019 0021   REPTSTATUS 05/25/2019 FINAL 05/24/2019 0021    Radiology Studies: DG CHEST PORT 1 VIEW  Result Date: 01/10/2023 CLINICAL DATA:  Preop cardiovascular exam EXAM: PORTABLE CHEST 1 VIEW COMPARISON:  11/12/2022 FINDINGS: Prior CABG. Cardiomegaly.  Aortic atherosclerosis. Vascular congestion. No overt edema. Linear atelectasis in the left mid lung. No visible effusions. No acute bony abnormality. IMPRESSION: Cardiomegaly, vascular congestion. Left mid lung  subsegmental atelectasis. Electronically Signed   By: Charlett Nose M.D.   On: 01/10/2023 22:55   CT HIP RIGHT WO CONTRAST  Result Date: 01/10/2023 CLINICAL DATA:  Hip replacement, loosening suspected Hip surgical planning EXAM: CT OF THE RIGHT HIP WITHOUT CONTRAST TECHNIQUE: Multidetector CT imaging of the right hip was performed according to the standard protocol. Multiplanar CT image reconstructions were also generated. RADIATION DOSE REDUCTION: This exam was performed according to the departmental dose-optimization program which includes automated exposure control, adjustment of the mA and/or kV according to patient size and/or use of iterative reconstruction technique. COMPARISON:  X-ray 01/08/2023, 11/12/2022 FINDINGS: Bones/Joint/Cartilage Postsurgical changes from right hip ORIF associated with a intertrochanteric fracture of the proximal right femur. Fracture lines remain visible. There is mild periosteal new bone formation at the fracture location. Femoral neck lag screw has migrated within the femoral head and the distal tip is now positioned intra-articularly extending 2 mm beyond the femoral head cortex. No definite new fracture. Hip joint alignment is maintained without dislocation. Mild to moderate right hip joint osteoarthritis. Old healed fractures of the right superior and inferior pubic rami. Ligaments Suboptimally assessed by CT. Muscles and Tendons No acute musculotendinous abnormality by CT. Soft tissues Nonspecific soft tissue swelling/edema over the lateral aspect of the right hip and proximal thigh. No organized fluid collections. Atherosclerotic vascular calcification. No right inguinal lymphadenopathy. IMPRESSION: 1. Postsurgical changes from right hip ORIF associated with a intertrochanteric fracture of the proximal right femur. Fracture lines remain visible. There is mild periosteal new bone formation at the fracture location. 2. Femoral neck lag screw has migrated within the femoral  head and the distal tip is now positioned intra-articularly. 3. Mild to moderate right hip joint osteoarthritis. 4. Old healed fractures of the right superior and inferior pubic rami. 5. Nonspecific soft tissue swelling/edema over the lateral aspect of the right hip and proximal thigh. No organized fluid collections. Electronically Signed   By: Duanne Guess D.O.   On: 01/10/2023 11:44     LOS: 1 day   Lanae Boast, MD [facility]  01/11/2023, 12:14 PM

## 2023-01-12 ENCOUNTER — Inpatient Hospital Stay (HOSPITAL_COMMUNITY): Payer: Medicare Other

## 2023-01-12 ENCOUNTER — Other Ambulatory Visit: Payer: Self-pay

## 2023-01-12 ENCOUNTER — Encounter (HOSPITAL_COMMUNITY): Payer: Self-pay | Admitting: Internal Medicine

## 2023-01-12 ENCOUNTER — Inpatient Hospital Stay (HOSPITAL_COMMUNITY): Payer: Medicare Other | Admitting: Anesthesiology

## 2023-01-12 ENCOUNTER — Encounter (HOSPITAL_COMMUNITY)
Disposition: A | Discharge status: Skilled Nursing Facility | Payer: Self-pay | Source: Other Acute Inpatient Hospital | Attending: Internal Medicine

## 2023-01-12 DIAGNOSIS — S72001A Fracture of unspecified part of neck of right femur, initial encounter for closed fracture: Secondary | ICD-10-CM | POA: Diagnosis not present

## 2023-01-12 DIAGNOSIS — Z01810 Encounter for preprocedural cardiovascular examination: Secondary | ICD-10-CM

## 2023-01-12 HISTORY — PX: TOTAL HIP REVISION: SHX763

## 2023-01-12 LAB — ECHOCARDIOGRAM COMPLETE
AR max vel: 2.26 cm2
AV Area VTI: 1.89 cm2
AV Area mean vel: 1.97 cm2
AV Mean grad: 5 mmHg
AV Peak grad: 7.2 mmHg
Ao pk vel: 1.35 m/s
Area-P 1/2: 4.39 cm2
Height: 73 in
MV M vel: 4.16 m/s
MV Peak grad: 69.2 mmHg
P 1/2 time: 550 ms
S' Lateral: 4.4 cm
Weight: 3463.87 oz

## 2023-01-12 LAB — GLUCOSE, CAPILLARY
Glucose-Capillary: 105 mg/dL — ABNORMAL HIGH (ref 70–99)
Glucose-Capillary: 106 mg/dL — ABNORMAL HIGH (ref 70–99)
Glucose-Capillary: 137 mg/dL — ABNORMAL HIGH (ref 70–99)
Glucose-Capillary: 141 mg/dL — ABNORMAL HIGH (ref 70–99)
Glucose-Capillary: 142 mg/dL — ABNORMAL HIGH (ref 70–99)
Glucose-Capillary: 146 mg/dL — ABNORMAL HIGH (ref 70–99)
Glucose-Capillary: 89 mg/dL (ref 70–99)
Glucose-Capillary: 91 mg/dL (ref 70–99)
Glucose-Capillary: 94 mg/dL (ref 70–99)

## 2023-01-12 LAB — BASIC METABOLIC PANEL
Anion gap: 13 (ref 5–15)
BUN: 63 mg/dL — ABNORMAL HIGH (ref 8–23)
CO2: 27 mmol/L (ref 22–32)
Calcium: 8.7 mg/dL — ABNORMAL LOW (ref 8.9–10.3)
Chloride: 92 mmol/L — ABNORMAL LOW (ref 98–111)
Creatinine, Ser: 3.29 mg/dL — ABNORMAL HIGH (ref 0.61–1.24)
GFR, Estimated: 17 mL/min — ABNORMAL LOW (ref >60)
Glucose, Bld: 151 mg/dL — ABNORMAL HIGH (ref 70–99)
Potassium: 4.6 mmol/L (ref 3.5–5.1)
Sodium: 132 mmol/L — ABNORMAL LOW (ref 135–145)

## 2023-01-12 LAB — POCT I-STAT, CHEM 8
BUN: 77 mg/dL — ABNORMAL HIGH (ref 8–23)
Calcium, Ion: 1.07 mmol/L — ABNORMAL LOW (ref 1.15–1.40)
Chloride: 96 mmol/L — ABNORMAL LOW (ref 98–111)
Creatinine, Ser: 3.5 mg/dL — ABNORMAL HIGH (ref 0.61–1.24)
Glucose, Bld: 108 mg/dL — ABNORMAL HIGH (ref 70–99)
HCT: 37 % — ABNORMAL LOW (ref 39.0–52.0)
Hemoglobin: 12.6 g/dL — ABNORMAL LOW (ref 13.0–17.0)
Potassium: 5 mmol/L (ref 3.5–5.1)
Sodium: 134 mmol/L — ABNORMAL LOW (ref 135–145)
TCO2: 30 mmol/L (ref 22–32)

## 2023-01-12 LAB — CBC
HCT: 35.3 % — ABNORMAL LOW (ref 39.0–52.0)
Hemoglobin: 11.2 g/dL — ABNORMAL LOW (ref 13.0–17.0)
MCH: 31.5 pg (ref 26.0–34.0)
MCHC: 31.7 g/dL (ref 30.0–36.0)
MCV: 99.4 fL (ref 80.0–100.0)
Platelets: 140 10*3/uL — ABNORMAL LOW (ref 150–400)
RBC: 3.55 MIL/uL — ABNORMAL LOW (ref 4.22–5.81)
RDW: 15.3 % (ref 11.5–15.5)
WBC: 5.2 10*3/uL (ref 4.0–10.5)
nRBC: 0 % (ref 0.0–0.2)

## 2023-01-12 LAB — HEPATITIS B SURFACE ANTIBODY, QUANTITATIVE: Hep B S AB Quant (Post): 25.3 m[IU]/mL

## 2023-01-12 SURGERY — TOTAL HIP REVISION
Anesthesia: General | Site: Hip | Laterality: Right

## 2023-01-12 MED ORDER — SODIUM CHLORIDE 0.9 % IV SOLN: INTRAVENOUS | Status: DC | PRN

## 2023-01-12 MED ORDER — BUPIVACAINE LIPOSOME 1.3 % IJ SUSP
INTRAMUSCULAR | Status: DC | PRN
  Administered 2023-01-12: 20 mL

## 2023-01-12 MED ORDER — ONDANSETRON HCL 4 MG/2ML IJ SOLN
INTRAMUSCULAR | Status: DC | PRN
  Administered 2023-01-12: 4 mg via INTRAVENOUS

## 2023-01-12 MED ORDER — SUGAMMADEX SODIUM 200 MG/2ML IV SOLN
INTRAVENOUS | Status: DC | PRN
  Administered 2023-01-12: 200 mg via INTRAVENOUS

## 2023-01-12 MED ORDER — FENTANYL CITRATE (PF) 100 MCG/2ML IJ SOLN
25.0000 ug | INTRAMUSCULAR | Status: DC | PRN
  Administered 2023-01-12: 25 ug via INTRAVENOUS

## 2023-01-12 MED ORDER — PHENYLEPHRINE HCL-NACL 20-0.9 MG/250ML-% IV SOLN
INTRAVENOUS | Status: DC | PRN
  Administered 2023-01-12: 20 ug/min via INTRAVENOUS

## 2023-01-12 MED ORDER — CEFAZOLIN SODIUM-DEXTROSE 2-4 GM/100ML-% IV SOLN
2.0000 g | Freq: Once | INTRAVENOUS | Status: AC
  Administered 2023-01-12: 2 g via INTRAVENOUS
  Filled 2023-01-12: qty 100

## 2023-01-12 MED ORDER — FENTANYL CITRATE (PF) 250 MCG/5ML IJ SOLN
INTRAMUSCULAR | Status: DC | PRN
  Administered 2023-01-12 (×4): 50 ug via INTRAVENOUS

## 2023-01-12 MED ORDER — ORAL CARE MOUTH RINSE: 15.0000 mL | Freq: Once | OROMUCOSAL | Status: AC

## 2023-01-12 MED ORDER — SODIUM CHLORIDE 0.9 % IV SOLN: INTRAVENOUS | Status: DC

## 2023-01-12 MED ORDER — SODIUM CHLORIDE (PF) 0.9 % IJ SOLN
INTRAMUSCULAR | Status: AC
  Filled 2023-01-12: qty 10

## 2023-01-12 MED ORDER — CLOPIDOGREL BISULFATE 75 MG PO TABS
75.0000 mg | ORAL_TABLET | Freq: Every day | ORAL | Status: DC
  Administered 2023-01-14 – 2023-01-17 (×4): 75 mg via ORAL
  Filled 2023-01-12 (×4): qty 1

## 2023-01-12 MED ORDER — CHLORHEXIDINE GLUCONATE 0.12 % MT SOLN: 15.0000 mL | Freq: Once | OROMUCOSAL | Status: AC

## 2023-01-12 MED ORDER — SODIUM CHLORIDE (PF) 0.9 % IJ SOLN
INTRAMUSCULAR | Status: DC | PRN
  Administered 2023-01-12: 30 mL via INTRAVENOUS

## 2023-01-12 MED ORDER — HYDROMORPHONE HCL 1 MG/ML IJ SOLN: 0.5000 mg | INTRAMUSCULAR | Status: DC | PRN

## 2023-01-12 MED ORDER — SURGIRINSE WOUND IRRIGATION SYSTEM - OPTIME: TOPICAL | Status: DC | PRN

## 2023-01-12 MED ORDER — FENTANYL CITRATE (PF) 100 MCG/2ML IJ SOLN
INTRAMUSCULAR | Status: AC
  Filled 2023-01-12: qty 2

## 2023-01-12 MED ORDER — ASPIRIN 81 MG PO TBEC
81.0000 mg | DELAYED_RELEASE_TABLET | Freq: Two times a day (BID) | ORAL | Status: AC
  Administered 2023-01-13 (×2): 81 mg via ORAL
  Filled 2023-01-12 (×2): qty 1

## 2023-01-12 MED ORDER — ACETAMINOPHEN 10 MG/ML IV SOLN
INTRAVENOUS | Status: AC
  Filled 2023-01-12: qty 100

## 2023-01-12 MED ORDER — ROCURONIUM BROMIDE 10 MG/ML (PF) SYRINGE
PREFILLED_SYRINGE | INTRAVENOUS | Status: DC | PRN
  Administered 2023-01-12 (×4): 10 mg via INTRAVENOUS
  Administered 2023-01-12: 50 mg via INTRAVENOUS

## 2023-01-12 MED ORDER — LIDOCAINE 2% (20 MG/ML) 5 ML SYRINGE
INTRAMUSCULAR | Status: DC | PRN
  Administered 2023-01-12: 80 mg via INTRAVENOUS

## 2023-01-12 MED ORDER — BUPIVACAINE-EPINEPHRINE 0.25% -1:200000 IJ SOLN
INTRAMUSCULAR | Status: DC | PRN
  Administered 2023-01-12: 30 mL

## 2023-01-12 MED ORDER — SODIUM CHLORIDE 0.9 % IR SOLN
Status: DC | PRN
  Administered 2023-01-12: 3000 mL

## 2023-01-12 MED ORDER — CHLORHEXIDINE GLUCONATE CLOTH 2 % EX PADS: 6.0000 | MEDICATED_PAD | Freq: Every day | CUTANEOUS | Status: DC

## 2023-01-12 MED ORDER — PROCHLORPERAZINE EDISYLATE 10 MG/2ML IJ SOLN: 5.0000 mg | Freq: Four times a day (QID) | INTRAMUSCULAR | Status: DC | PRN

## 2023-01-12 MED ORDER — ETOMIDATE 2 MG/ML IV SOLN
INTRAVENOUS | Status: DC | PRN
Start: 2023-01-12 — End: 2023-01-12
  Administered 2023-01-12: 12 mg via INTRAVENOUS

## 2023-01-12 MED ORDER — FENTANYL CITRATE (PF) 250 MCG/5ML IJ SOLN
INTRAMUSCULAR | Status: AC
  Filled 2023-01-12: qty 5

## 2023-01-12 MED ORDER — PROPOFOL 10 MG/ML IV BOLUS
INTRAVENOUS | Status: AC
  Filled 2023-01-12: qty 20

## 2023-01-12 MED ORDER — ALBUMIN HUMAN 5 % IV SOLN: INTRAVENOUS | Status: DC | PRN

## 2023-01-12 MED ORDER — PHENYLEPHRINE HCL-NACL 20-0.9 MG/250ML-% IV SOLN
INTRAVENOUS | Status: AC
  Filled 2023-01-12: qty 250

## 2023-01-12 MED ORDER — TRANEXAMIC ACID-NACL 1000-0.7 MG/100ML-% IV SOLN: 1000.0000 mg | INTRAVENOUS | Status: DC

## 2023-01-12 MED ORDER — 0.9 % SODIUM CHLORIDE (POUR BTL) OPTIME
TOPICAL | Status: DC | PRN
  Administered 2023-01-12: 1000 mL

## 2023-01-12 MED ORDER — BUPIVACAINE LIPOSOME 1.3 % IJ SUSP
INTRAMUSCULAR | Status: AC
  Filled 2023-01-12: qty 20

## 2023-01-12 MED ORDER — DEXAMETHASONE SODIUM PHOSPHATE 10 MG/ML IJ SOLN
INTRAMUSCULAR | Status: DC | PRN
  Administered 2023-01-12: 10 mg via INTRAVENOUS

## 2023-01-12 MED ORDER — CHLORHEXIDINE GLUCONATE 0.12 % MT SOLN
OROMUCOSAL | Status: AC
  Administered 2023-01-12: 15 mL via OROMUCOSAL
  Filled 2023-01-12: qty 15

## 2023-01-12 MED ORDER — ACETAMINOPHEN 10 MG/ML IV SOLN
INTRAVENOUS | Status: DC | PRN
  Administered 2023-01-12: 1000 mg via INTRAVENOUS

## 2023-01-12 MED ORDER — CEFAZOLIN SODIUM-DEXTROSE 2-4 GM/100ML-% IV SOLN
2.0000 g | Freq: Two times a day (BID) | INTRAVENOUS | Status: AC
  Administered 2023-01-13 – 2023-01-14 (×3): 2 g via INTRAVENOUS
  Filled 2023-01-12 (×4): qty 100

## 2023-01-12 MED ORDER — DROPERIDOL 2.5 MG/ML IJ SOLN: 0.6250 mg | Freq: Once | INTRAMUSCULAR | Status: DC | PRN

## 2023-01-12 MED ORDER — BUPIVACAINE-EPINEPHRINE (PF) 0.25% -1:200000 IJ SOLN
INTRAMUSCULAR | Status: AC
  Filled 2023-01-12: qty 30

## 2023-01-12 SURGICAL SUPPLY — 74 items
ADH SKN CLS APL DERMABOND .7 (GAUZE/BANDAGES/DRESSINGS) ×1
APL PRP STRL LF DISP 70% ISPRP (MISCELLANEOUS) ×1
BLADE SAGITTAL 25.0X1.27X90 (BLADE) ×1 IMPLANT
BNDG CMPR 5X4 CHSV STRCH STRL (GAUZE/BANDAGES/DRESSINGS) ×1
BNDG COHESIVE 4X5 TAN STRL LF (GAUZE/BANDAGES/DRESSINGS) IMPLANT
BRUSH FEMORAL CANAL (MISCELLANEOUS) IMPLANT
CEMENT BONE SIMPLEX SPEEDSET (Cement) IMPLANT
CEMENT RESTRICTOR BONE PREP ST (Cement) IMPLANT
CHLORAPREP W/TINT 26 (MISCELLANEOUS) ×2 IMPLANT
COVER SURGICAL LIGHT HANDLE (MISCELLANEOUS) ×1 IMPLANT
DERMABOND ADVANCED .7 DNX12 (GAUZE/BANDAGES/DRESSINGS) IMPLANT
DRAPE HALF SHEET 40X57 (DRAPES) ×1 IMPLANT
DRAPE HIP W/POCKET STRL (MISCELLANEOUS) ×1 IMPLANT
DRAPE INCISE IOBAN 85X60 (DRAPES) ×1 IMPLANT
DRAPE POUCH INSTRU U-SHP 10X18 (DRAPES) ×1 IMPLANT
DRAPE U-SHAPE 47X51 STRL (DRAPES) ×2 IMPLANT
DRSG AQUACEL AG ADV 3.5X10 (GAUZE/BANDAGES/DRESSINGS) ×1 IMPLANT
DRSG TEGADERM 4X4.5 CHG (GAUZE/BANDAGES/DRESSINGS) IMPLANT
ELECT BLADE 4.0 EZ CLEAN MEGAD (MISCELLANEOUS) ×1
ELECTRODE BLDE 4.0 EZ CLN MEGD (MISCELLANEOUS) ×1 IMPLANT
GAUZE SPONGE 4X4 12PLY STRL (GAUZE/BANDAGES/DRESSINGS) IMPLANT
GLOVE BIOGEL PI IND STRL 8 (GLOVE) ×1 IMPLANT
GLOVE SRG 8 PF TXTR STRL LF DI (GLOVE) ×1 IMPLANT
GLOVE SURG ORTHO 8.0 STRL STRW (GLOVE) ×2 IMPLANT
GLOVE SURG UNDER POLY LF SZ8 (GLOVE) ×1
GOWN STRL REUS W/ TWL LRG LVL3 (GOWN DISPOSABLE) ×2 IMPLANT
GOWN STRL REUS W/ TWL XL LVL3 (GOWN DISPOSABLE) ×1 IMPLANT
GOWN STRL REUS W/TWL LRG LVL3 (GOWN DISPOSABLE) ×2
GOWN STRL REUS W/TWL XL LVL3 (GOWN DISPOSABLE) ×1
GUIDEPIN VERSANAIL DSP 3.2X444 (ORTHOPEDIC DISPOSABLE SUPPLIES) IMPLANT
HANDPIECE INTERPULSE COAX TIP (DISPOSABLE) ×1
HEAD BIOLOX HIP 28/+4 (Joint) IMPLANT
HIP BIOLOX HD 28/+4 (Joint) ×1 IMPLANT
HOOD PEEL AWAY T7 (MISCELLANEOUS) ×3 IMPLANT
INSERT 0 DEG POLY 36 F (Miscellaneous) IMPLANT
INSERT ADM X3 REST 28/52 (Insert) IMPLANT
KIT BASIN OR (CUSTOM PROCEDURE TRAY) ×1 IMPLANT
KIT TURNOVER KIT A (KITS) ×1 IMPLANT
LINER MDM 46MM (Orthopedic Implant) IMPLANT
MANIFOLD NEPTUNE II (INSTRUMENTS) ×1 IMPLANT
MARKER SKIN DUAL TIP RULER LAB (MISCELLANEOUS) ×1 IMPLANT
NDL 18GX1X1/2 (RX/OR ONLY) (NEEDLE) ×1 IMPLANT
NEEDLE 18GX1X1/2 (RX/OR ONLY) (NEEDLE) ×1 IMPLANT
NS IRRIG 1000ML POUR BTL (IV SOLUTION) ×1 IMPLANT
PACK TOTAL JOINT (CUSTOM PROCEDURE TRAY) ×1 IMPLANT
PRESSURIZER FEMORAL UNIV (MISCELLANEOUS) IMPLANT
RETRIEVER SUT HEWSON (MISCELLANEOUS) ×1 IMPLANT
SCREW HEX LP 6.5X20 (Screw) IMPLANT
SCREW HEX LP 6.5X25 (Screw) IMPLANT
SCREW HEX LP 6.5X35 (Screw) IMPLANT
SEALER BIPOLAR AQUA 6.0 (INSTRUMENTS) ×1 IMPLANT
SET HNDPC FAN SPRY TIP SCT (DISPOSABLE) ×1 IMPLANT
SHELL ACETAB TRIDENT 58 (Shell) IMPLANT
SLEEVE BEADED CABLE (Orthopedic Implant) IMPLANT
SLEEVE CABLE 2M VIT (Orthopedic Implant) IMPLANT
SOLUTION IRRIG SURGIPHOR (IV SOLUTION) ×1 IMPLANT
STEM FEM CMT SZ7 V40 40X158 (Stem) IMPLANT
SUCTION TUBE FRAZIER 10FR DISP (SUCTIONS) ×1 IMPLANT
SUT BONE WAX W31G (SUTURE) ×1 IMPLANT
SUT ETHIBOND 2 V 37 (SUTURE) ×1 IMPLANT
SUT ETHILON 3 0 PS 1 (SUTURE) IMPLANT
SUT MNCRL AB 3-0 PS2 18 (SUTURE) ×1 IMPLANT
SUT STRATAFIX 1PDS 45CM VIOLET (SUTURE) ×2 IMPLANT
SUT VIC AB 0 CT1 27 (SUTURE) ×1
SUT VIC AB 0 CT1 27XBRD ANBCTR (SUTURE) ×1 IMPLANT
SUT VIC AB 0 CT1 36 (SUTURE) IMPLANT
SUT VIC AB 2-0 CT2 27 (SUTURE) ×2 IMPLANT
SYR 20ML LL LF (SYRINGE) ×1 IMPLANT
SYR 50ML LL SCALE MARK (SYRINGE) ×1 IMPLANT
TOWEL GREEN STERILE (TOWEL DISPOSABLE) ×1 IMPLANT
TOWER CARTRIDGE SMART MIX (DISPOSABLE) IMPLANT
TRAY FOLEY MTR SLVR 16FR STAT (SET/KITS/TRAYS/PACK) ×1 IMPLANT
TUBE SUCT ARGYLE STRL (TUBING) ×1 IMPLANT
WATER STERILE IRR 1000ML POUR (IV SOLUTION) ×1 IMPLANT

## 2023-01-12 NOTE — Consult Note (Signed)
ORTHOPAEDIC CONSULTATION  REQUESTING PHYSICIAN: Lanae Boast, MD  Chief Complaint: Right hip fracture with fixation failure  HPI: Matthew [name] is a 89 y.o. male who had sustained a right intertrochanteric femur fracture underwent ORIF of Seph medullary nail about 2 months ago.  He had been doing well postoperatively.  Unfortunately recently start developing severe pain and ability to weight-bear.  He denies any new injury.  Denies pain in the joints or extremities.  He denies distal numbness and tingling.   Past Medical History:  Diagnosis Date   Anginal pain (HCC)    Arthritis    Ascending aortic aneurysm (HCC) 07/18/2015   a.) TTE 07/18/2015: severe dilation of ascending aorta measuring 7.0 cm. b.) TTE 01/10/2018: aortic root 3.8 cm; ascending aorta 6.8 cm; refused surgical intervention/repair.   Atrial fibrillation (HCC)    a.) CHA2DS2-VASc = 6 (age x 2, HFrEF, HTN, previous MI, T2DM). b.) rate/rhythm maintained on amiodarone + metoprolol succinate; chronic antiplatelet therapy using clopidogrel   Bradycardia    Coronary artery disease    ESRD (end stage renal disease) (HCC)    GERD (gastroesophageal reflux disease)    HFrEF (heart failure with reduced ejection fraction) (HCC)    a.) TTE 12/03/2013: mod dec LV function; EF 35-40%; severe inferolateral and inferior HK; LA dilated; G3DD; PASP 52 mmHg. b.) TTE 07/18/2015: mild LV dysfunction with mild LVH; EF 45%; mild BAE. c.) TTE 01/10/2018: mod LV dysfunction; EF 45%; diffuse HK   HLD (hyperlipidemia)    Hypertension    IDA (iron deficiency anemia)    Long term current use of antithrombotics/antiplatelets    a.) clopidogrel   Myocardial infarction (HCC) 2000   Pneumonia    S/P CABG x 4 2001   a.) LIMA-LAD, SVG-D1, SVG-OM1, SVG-OM2, SVG-PDA   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   left frontal scalp, EDC   Squamous cell carcinoma of skin 05/05/2020   left temporal scalp, in situ, EDC 05/13/20   T2DM (type 2 diabetes  mellitus) (HCC)    Valvular insufficiency 12/03/2013   a.) TTE 12/03/2013: EF 35-40%; mild AR/MR, b.) TTE 07/18/2015: EF 45%; mild AR/PR, mod TR, severe MR. c.) TTE 01/10/2018: mod AR/TR   Past Surgical History:  Procedure Laterality Date   A/V SHUNTOGRAM Left 04/01/2022   Procedure: A/V SHUNTOGRAM;  Surgeon: Annice Needy, MD;  Location: [facility] INVASIVE CV LAB;  Service: Cardiovascular;  Laterality: Left;   AV FISTULA PLACEMENT Left 04/15/2021   Procedure: INSERTION OF ARTERIOVENOUS (AV) GORE-TEX GRAFT ARM ( BRACHIAL AXILLARY);  Surgeon: Annice Needy, MD;  Location: [facility] ORS;  Service: Vascular;  Laterality: Left;   CATARACT EXTRACTION W/PHACO Left 07/26/2017   Procedure: CATARACT EXTRACTION PHACO AND INTRAOCULAR LENS PLACEMENT (IOC);  Surgeon: Galen Manila, MD;  Location: [facility] ORS;  Service: Ophthalmology;  Laterality: Left;  [place]   00:45.8 AP%  13.1 CDE  6.00 Fluid Pack Lot # [id number]   CATARACT EXTRACTION W/PHACO Right 08/17/2017   Procedure: CATARACT EXTRACTION PHACO AND INTRAOCULAR LENS PLACEMENT (IOC);  Surgeon: Galen Manila, MD;  Location: [facility] ORS;  Service: Ophthalmology;  Laterality: Right;  [place]  01:30 AP% 16.8 CDE 15.24 Fluid pack lot # [id number]   CHOLECYSTECTOMY     CORONARY ARTERY BYPASS GRAFT N/A 2001   Procedure: 5v CABG (LIMA-LAD, SVG-D1, SVG-OM1, SVG-OM2, SVG-PDA)   DIALYSIS/PERMA CATHETER INSERTION N/A 02/09/2018   Procedure: DIALYSIS/PERMA CATHETER INSERTION;  Surgeon: Annice Needy, MD;  Location: [facility];  Service: Cardiovascular;  Laterality: N/A;  DIALYSIS/PERMA CATHETER REMOVAL N/A 06/25/2021   Procedure: DIALYSIS/PERMA CATHETER REMOVAL;  Surgeon: Annice Needy, MD;  Location: [facility];  Service: Cardiovascular;  Laterality: N/A;   FRACTURE SURGERY Left 2015   LEFT arm   INSERTION OF DIALYSIS CATHETER Right 11/03/2020   Procedure: INSERTION OF Perm Cath in the RIGHT INTERNAL JUGULAR;  Surgeon: Annice Needy, MD;  Location: [facility] ORS;   Service: Vascular;  Laterality: Right;   INTRAMEDULLARY (IM) NAIL INTERTROCHANTERIC Right 11/12/2022   Procedure: INTRAMEDULLARY (IM) NAIL INTERTROCHANTERIC;  Surgeon: Juanell Fairly, MD;  Location: [facility] OR[facility];  Service: Orthopedics;  Laterality: Right;   LEFT HEART CATHETERIZATION WITH CORONARY/GRAFT ANGIOGRAM Left 12/04/2013   Procedure: LEFT HEART CATHETERIZATION WITH Isabel Caprice;  Surgeon: Marykay Lex, MD;  Location: [facility] CATH LAB;  Service: Cardiovascular;  Laterality: Left;   LEFT HEART CATHETERIZATION WITH CORONARY/GRAFT ANGIOGRAM Left 02/06/2009   Procedure: LEFT HEART CATHETERIZATION WITH CORONARY/GRAFT ANGIOGRAM; Location: [facility]; Surgeon: Despina Hick, MD   PERCUTANEOUS CORONARY STENT INTERVENTION (PCI-S) N/A 12/06/2013   Procedure: STAGED PERCUTANEOUS CORONARY STENT INTERVENTION (overlapping 4.0 x 38 mm (mid) and 4.0 x 28 mm (ostial) Promus Primier DES to SVG-RPDA graft);  Surgeon: Lesleigh Noe, MD;  Location: [facility] CATH LAB;  Service: Cardiovascular   REMOVAL OF A DIALYSIS CATHETER N/A 11/03/2020   Procedure: REMOVAL OF A DIALYSIS CATHETER;  Surgeon: Annice Needy, MD;  Location: [facility] ORS;  Service: Vascular;  Laterality: N/A;   Social History   Socioeconomic History   Marital status: Widowed    Spouse name: Not on file   Number of children: Not on file   Years of education: Not on file   Highest education level: Not on file  Occupational History   Not on file  Tobacco Use   Smoking status: Former    Current packs/day: 0.00    Types: Pipe, Cigarettes    Quit date: 72    Years since quitting: 32.6   Smokeless tobacco: Never  Vaping Use   Vaping status: Never Used  Substance and Sexual Activity   Alcohol use: Not Currently    Alcohol/week: 1.0 standard drink of alcohol    Types: 1 Cans of beer per week    Comment: occasionally drinking only,once a month   Drug use: No   Sexual activity: Never  Other Topics Concern   Not on file  Social History  Narrative   Lives with daughter and son in law   Social Determinants of Health   Financial Resource Strain: Low Risk  (01/10/2018)   Overall Financial Resource Strain (CARDIA)    Difficulty of Paying Living Expenses: Not hard at all  Food Insecurity: No Food Insecurity (01/11/2023)   Hunger Vital Sign    Worried About Running Out of Food in the Last Year: Never true    Ran Out of Food in the Last Year: Never true  Transportation Needs: No Transportation Needs (01/11/2023)   PRAPARE - Administrator, Civil Service (Medical): No    Lack of Transportation (Non-Medical): No  Physical Activity: Inactive (01/10/2018)   Exercise Vital Sign    Days of Exercise per Week: 0 days    Minutes of Exercise per Session: 0 min  Stress: No Stress Concern Present (01/10/2018)   [employer] of Occupational Health - Occupational Stress Questionnaire    Feeling of Stress : Not at all  Social Connections: Socially Integrated (01/10/2018)   Social Connection and Isolation Panel [NHANES]    Frequency  of Communication with Friends and Family: More than three times a week    Frequency of Social Gatherings with Friends and Family: More than three times a week    Attends Religious Services: More than 4 times per year    Active Member of [employer] or Organizations: Yes    Attends Engineer, structural: More than 4 times per year    Marital Status: Married   Family History  Problem Relation Age of Onset   Heart attack Father 23       died first MI at age 51   Heart failure Mother    Allergies  Allergen Reactions   Other     Other reaction(s): Myalgias (intolerance), Other (See Comments) Statin Drugs  Statin Drugs     Amoxicillin Diarrhea     Positive ROS: All other systems have been reviewed and were otherwise negative with the exception of those mentioned in the HPI and as above.  Physical Exam: General: Alert, no acute distress Cardiovascular: No pedal edema Respiratory: No  cyanosis, no use of accessory musculature Skin: No lesions in the area of chief complaint Neurologic: Sensation intact distally Psychiatric: Patient is competent for consent with normal mood and affect  MUSCULOSKELETAL:  RLE healing incisions from prior surgery.  No erythema or drainage  Nontender  groin pain with log roll  No knee or ankle effusion  Sens DPN, SPN, TN intact  Motor EHL, ext, flex 5/5  DP 2+, PT 2+, No significant edema   IMAGING: X-rays and CT scan reviewed demonstrates compression of the intra intertrochanteric femur fracture with penetration of the screw through the femoral head.  Assessment: Principal Problem:   Closed right hip fracture (HCC) Active Problems:   CAD (coronary artery disease)   Chronic hypotension   Hypothyroidism   Chronic systolic CHF (congestive heart failure) (HCC)   End-stage renal disease on hemodialysis (HCC)   Atrial fibrillation (HCC)   Dissecting aortic aneurysm, thoracic (HCC)   HFrEF (heart failure with reduced ejection fraction) (HCC)   Closed hip fracture requiring operative repair, right, sequela   Right intertrochanteric femur fracture with fixation failure and screw penetration through the femoral head  Plan: X-rays and CT scan demonstrated penetration of the cephallomedullary screw through the femoral head due to compression of the fracture.  Given the severe debilitating pain and damage to the cartilage conversion to total hip arthroplasty with hardware removal was recommended.  The risks benefits and alternatives were discussed with the patient including but not limited to the risks of nonoperative treatment, versus surgical intervention including infection, bleeding, nerve injury, malunion, nonunion, the need for revision surgery, hardware prominence, hardware failure, the need for hardware removal, blood clots, cardiopulmonary complications, morbidity, mortality, among others, and they were willing to proceed.  Consent was  signed by myself and the patient.  Right hip was marked.    Joen Laura, MD  Contact information:   [email or web address] 7am-5pm epic message Dr. Blanchie Dessert, or call office for patient follow up: [phone] After hours and holidays please check [email or web address] for group call information for [facility]

## 2023-01-12 NOTE — Anesthesia Procedure Notes (Addendum)
Arterial Line Insertion Start/End8/21/2024 2:30 PM, 01/12/2023 2:50 PM Performed by: [name], MD, anesthesiologist  Patient location: Pre-op. Preanesthetic checklist: patient identified, IV checked, site marked, risks and benefits discussed, surgical consent, monitors and equipment checked, pre-op evaluation, timeout performed and anesthesia consent Lidocaine 1% used for infiltration Right, radial was placed Catheter size: 20 G Hand hygiene performed , maximum sterile barriers used  and Seldinger technique used  Attempts: 1 Procedure performed using ultrasound guided technique. Ultrasound Notes:anatomy identified, needle tip was noted to be adjacent to the nerve/plexus identified, no ultrasound evidence of intravascular and/or intraneural injection and image(s) printed for medical record Following insertion, dressing applied. Post procedure assessment: normal and unchanged  Patient tolerated the procedure well with no immediate complications.

## 2023-01-12 NOTE — Discharge Instructions (Signed)
INSTRUCTIONS AFTER JOINT REPLACEMENT   Remove items at home which could result in a fall. This includes throw rugs or furniture in walking pathways ICE to the affected joint every three hours while awake for 30 minutes at a time, for at least the first 3-5 days, and then as needed for pain and swelling.  Continue to use ice for pain and swelling. You may notice swelling that will progress down to the foot and ankle.  This is normal after surgery.  Elevate your leg when you are not up walking on it.   Continue to use the breathing machine you got in the hospital (incentive spirometer) which will help keep your temperature down.  It is common for your temperature to cycle up and down following surgery, especially at night when you are not up moving around and exerting yourself.  The breathing machine keeps your lungs expanded and your temperature down.   DIET:  As you were doing prior to hospitalization, we recommend a well-balanced diet.  DRESSING / WOUND CARE / SHOWERING  Keep the surgical dressing until follow up.  The dressing is water proof, so you can shower without any extra covering.  IF THE DRESSING FALLS OFF or the wound gets wet inside, change the dressing with sterile gauze.  Please use good hand washing techniques before changing the dressing.  Do not use any lotions or creams on the incision until instructed by your surgeon.    ACTIVITY  Increase activity slowly as tolerated, but follow the weight bearing instructions below.   No driving for 6 weeks or until further direction given by your physician.  You cannot drive while taking narcotics.  No lifting or carrying greater than 10 lbs. until further directed by your surgeon. Avoid periods of inactivity such as sitting longer than an hour when not asleep. This helps prevent blood clots.  You may return to work once you are authorized by your doctor.     WEIGHT BEARING   Weight bearing as tolerated with assist device (walker, cane,  etc) as directed, use it as long as suggested by your surgeon or therapist, typically at least 4-6 weeks.   EXERCISES  Results after joint replacement surgery are often greatly improved when you follow the exercise, range of motion and muscle strengthening exercises prescribed by your doctor. Safety measures are also important to protect the joint from further injury. Any time any of these exercises cause you to have increased pain or swelling, decrease what you are doing until you are comfortable again and then slowly increase them. If you have problems or questions, call your caregiver or physical therapist for advice.   Rehabilitation is important following a joint replacement. After just a few days of immobilization, the muscles of the leg can become weakened and shrink (atrophy).  These exercises are designed to build up the tone and strength of the thigh and leg muscles and to improve motion. Often times heat used for twenty to thirty minutes before working out will loosen up your tissues and help with improving the range of motion but do not use heat for the first two weeks following surgery (sometimes heat can increase post-operative swelling).   These exercises can be done on a training (exercise) mat, on the floor, on a table or on a bed. Use whatever works the best and is most comfortable for you.    Use music or television while you are exercising so that the exercises are a pleasant break in your  day. This will make your life better with the exercises acting as a break in your routine that you can look forward to.   Perform all exercises about fifteen times, three times per day or as directed.  You should exercise both the operative leg and the other leg as well.  Exercises include:   Quad Sets - Tighten up the muscle on the front of the thigh (Quad) and hold for 5-10 seconds.   Straight Leg Raises - With your knee straight (if you were given a brace, keep it on), lift the leg to 60  degrees, hold for 3 seconds, and slowly lower the leg.  Perform this exercise against resistance later as your leg gets stronger.  Leg Slides: Lying on your back, slowly slide your foot toward your buttocks, bending your knee up off the floor (only go as far as is comfortable). Then slowly slide your foot back down until your leg is flat on the floor again.  Angel Wings: Lying on your back spread your legs to the side as far apart as you can without causing discomfort.  Hamstring Strength:  Lying on your back, push your heel against the floor with your leg straight by tightening up the muscles of your buttocks.  Repeat, but this time bend your knee to a comfortable angle, and push your heel against the floor.  You may put a pillow under the heel to make it more comfortable if necessary.   A rehabilitation program following joint replacement surgery can speed recovery and prevent re-injury in the future due to weakened muscles. Contact your doctor or a physical therapist for more information on knee rehabilitation.    CONSTIPATION  Constipation is defined medically as fewer than three stools per week and severe constipation as less than one stool per week.  Even if you have a regular bowel pattern at home, your normal regimen is likely to be disrupted due to multiple reasons following surgery.  Combination of anesthesia, postoperative narcotics, change in appetite and fluid intake all can affect your bowels.   YOU MUST use at least one of the following options; they are listed in order of increasing strength to get the job done.  They are all available over the counter, and you may need to use some, POSSIBLY even all of these options:    Drink plenty of fluids (prune juice may be helpful) and high fiber foods Colace 100 mg by mouth twice a day  Senokot for constipation as directed and as needed Dulcolax (bisacodyl), take with full glass of water  Miralax (polyethylene glycol) once or twice a day as  needed.  If you have tried all these things and are unable to have a bowel movement in the first 3-4 days after surgery call either your surgeon or your primary doctor.    If you experience loose stools or diarrhea, hold the medications until you stool forms back up.  If your symptoms do not get better within 1 week or if they get worse, check with your doctor.  If you experience "the worst abdominal pain ever" or develop nausea or vomiting, please contact the office immediately for further recommendations for treatment.   ITCHING:  If you experience itching with your medications, try taking only a single pain pill, or even half a pain pill at a time.  You can also use Benadryl over the counter for itching or also to help with sleep.   TED HOSE STOCKINGS:  Use stockings on both  legs until for at least 2 weeks or as directed by physician office. They may be removed at night for sleeping.  MEDICATIONS:  See your medication summary on the "After Visit Summary" that nursing will review with you.  You may have some home medications which will be placed on hold until you complete the course of blood thinner medication.  It is important for you to complete the blood thinner medication as prescribed.   Blood clot prevention (DVT Prophylaxis): After surgery you are at an increased risk for a blood clot. You are to presume your Plavix for a total of 4 weeks from surgery to help reduce your risk of getting a blood clot. Then you may take it as directed by your normal prescribing provider. Signs of a pulmonary embolus (blood clot in the lungs) include sudden short of breath, feeling lightheaded or dizzy, chest pain with a deep breath, rapid pulse rapid breathing. Signs of a blood clot in your arms or legs include new unexplained swelling and cramping, warm, red or darkened skin around the painful area. Please call the office or 911 right away if these signs or symptoms develop.  PRECAUTIONS:  If you experience  chest pain or shortness of breath - call 911 immediately for transfer to the hospital emergency department.   If you develop a fever greater that 101 F, purulent drainage from wound, increased redness or drainage from wound, foul odor from the wound/dressing, or calf pain - CONTACT YOUR SURGEON.                                                   FOLLOW-UP APPOINTMENTS:  If you do not already have a post-op appointment, please call the office for an appointment to be seen by your surgeon.  Guidelines for how soon to be seen are listed in your "After Visit Summary", but are typically between 2-3 weeks after surgery.   POST-OPERATIVE OPIOID TAPER INSTRUCTIONS: It is important to wean off of your opioid medication as soon as possible. If you do not need pain medication after your surgery it is ok to stop day one. Opioids include: Codeine, Hydrocodone(Norco, Vicodin), Oxycodone(Percocet, oxycontin) and hydromorphone amongst others.  Long term and even short term use of opiods can cause: Increased pain response Dependence Constipation Depression Respiratory depression And more.  Withdrawal symptoms can include Flu like symptoms Nausea, vomiting And more Techniques to manage these symptoms Hydrate well Eat regular healthy meals Stay active Use relaxation techniques(deep breathing, meditating, yoga) Do Not substitute Alcohol to help with tapering If you have been on opioids for less than two weeks and do not have pain than it is ok to stop all together.  Plan to wean off of opioids This plan should start within one week post op of your joint replacement. Maintain the same interval or time between taking each dose and first decrease the dose.  Cut the total daily intake of opioids by one tablet each day Next start to increase the time between doses. The last dose that should be eliminated is the evening dose.   MAKE SURE YOU:  Understand these instructions.  Get help right away if you are  not doing well or get worse.    Thank you for letting us be a part of your medical care team.  It is a privilege we respect  greatly.  We hope these instructions will help you stay on track for a fast and full recovery!

## 2023-01-12 NOTE — Progress Notes (Signed)
Heart Failure Navigator Progress Note  Assessed for Heart & Vascular TOC clinic readiness.  Patient does not meet criteria due to ESRD on hemodialysis.   Navigator will sign off at this time.    Dawn Fields, BSN, RN Heart Failure Nurse Navigator Secure Chat Only   

## 2023-01-12 NOTE — Anesthesia Procedure Notes (Signed)
Procedure Name: Intubation Date/Time: 01/12/2023 3:27 PM  Performed by: [name], MDPre-anesthesia Checklist: Patient identified, Emergency Drugs available, Suction available and Patient being monitored Patient Re-evaluated:Patient Re-evaluated prior to induction Oxygen Delivery Method: Circle system utilized Preoxygenation: Pre-oxygenation with 100% oxygen Induction Type: IV induction Ventilation: Mask ventilation without difficulty Laryngoscope Size: Mac and 4 Grade View: Grade II Tube type: Oral Tube size: 8.0 mm Number of attempts: 1 Airway Equipment and Method: Stylet and Oral airway Placement Confirmation: ETT inserted through vocal cords under direct vision, positive ETCO2 and breath sounds checked- equal and bilateral Secured at: 23 cm Tube secured with: Tape Dental Injury: Teeth and Oropharynx as per pre-operative assessment

## 2023-01-12 NOTE — Plan of Care (Signed)

## 2023-01-12 NOTE — Progress Notes (Signed)
PROGRESS NOTE Matthew [name]  OAC:[id number] DOB: [birth year] DOA: 01/10/2023 PCP: Sherron Monday, MD  Brief Narrative/Hospital Course: 89 y.o.M W/ recent history of right hip fracture s/p intramedullary rod and femoral neck screw ESRD on HD MWF,A-fib, CAD, systolic CHF, type 2 diabetes, osteoporosis, HLD, PAF not on anticoagulation due to fall risk, chronic orthostatic hypotension on midodrine, CAD/history of CABG, HFmrEF, hypothyroidism, insomnia,hx of aortic dissection, presented with right hip pain and admitted to [facility] from 8/17 to 8/19 and transferred to [facility] for to be evaluated by Dr. Carola Frost for migration of the femoral nail for possible surgical revision.  Subjective: Seen and examined this morning  Slightly hard of hearing but is alert awake oriented x 3, no pain.   Overnight afebrile BP stable on room air Potassium 4.6 bicarb 27 today  Assessment and Plan: Principal Problem:   Closed right hip fracture (HCC) Active Problems:   Closed hip fracture requiring operative repair, right, sequela   End-stage renal disease on hemodialysis (HCC)   Chronic hypotension   Atrial fibrillation (HCC)   CAD (coronary artery disease)   HFrEF (heart failure with reduced ejection fraction) (HCC)   Hypothyroidism   Chronic systolic CHF (congestive heart failure) (HCC)   Dissecting aortic aneurysm, thoracic (HCC)   Recent right hip fracture s/p intramedullary rod and femoral neck screw Right hip pain with migration of femoral nail: Dr. Carola Frost has been consulted and transferred from [facility] to [facility] for surgical revision. S/p CT hip at [facility].  Appreciate cardiology input - echo from 08/2021 showed EF 55.8% now EF is low at 35 to 40%  8/20 W/ LEft ventricular global hypokinesia aortic valve tricuspid, moderately elevated pulmonary artery systolic pressure-msg sent to Dr Antoine Poche from cardio. Patient is unaware of his A-fib and heart rate at times Loceryl discontinued..  Patient is at an elevated risk for  cardiovascular complication given his extensive past history this is and decreased mobility however patient family wants to proceed [place] his quality of life and no further preop testing or changes in therapy per cardiology  ESRD on HD MWF Anemia CKD MBD: Had HD 8/19, nephro is following closely continue HD per nephrology HD is due today  Trend ca, mag phos hb. Hb in 11 gm now.  Avoid nephrotoxic medication including morphine switch to Dilaudid Recent Labs    11/13/22 0522 11/15/22 0832 11/17/22 0436 11/18/22 0543 11/19/22 0610 01/08/23 1358 01/10/23 0408 01/10/23 2214 01/11/23 0019 01/12/23 0137  BUN 85* 82* 55* 39* 58* 65* 85* 46* 48* 63*  CREATININE 3.72* 3.72* 3.32* 2.38* 2.98* 2.93* 3.76* 2.72* 2.72* 3.29*  CO2 18* 26 30 30 28 28 28 28 29 27    TDM with hypoglycemia: Hold home glipizide, keep on SSI. Recent Labs  Lab 01/10/23 2214 01/10/23 2307 01/11/23 1616 01/11/23 2000 01/12/23 0017 01/12/23 0459 01/12/23 0824  GLUCAP  --    < > 109* 138* 142* 146* 94  HGBA1C 6.0*  --   --   --   --   --   --    < > = values in this interval not displayed.    Chronic hypotension: Continues midodrine.  BP stable.  History of dissecting aortic aneurysm Monitor BP.  CAD status post CABG: Asymptomatic.Intolerant to statin. Home Plavix on hold for surgery  HFmrEF: Monitor volume status.  Adjust fluid with dialysis, echo from 08/2021 shows EF 55.8% now EF is low at 35 to 40%   PAF not on anticoagulation due to fall risk: Continue  sotalol.  Hypothyroidism: Continue home Synthroid  Insomnia: On suvorexant  Recent COVID complicating his rehab stay and had treatment with Paxlovid and went home on August 3  Hypoalbuminemia: RD consult augment diet  Pressure injury coccyx stage II POA see below Pressure Injury 01/10/23 Coccyx Mid;Lower Stage 2 -  Partial thickness loss of dermis presenting as a shallow open injury with a red, pink wound bed without slough. 2 small areas on  sacrum (Active)  01/10/23 2100  Location: Coccyx  Location Orientation: Mid;Lower  Staging: Stage 2 -  Partial thickness loss of dermis presenting as a shallow open injury with a red, pink wound bed without slough.  Wound Description (Comments): 2 small areas on sacrum  Present on Admission: Yes  Dressing Type Foam - Lift dressing to assess site every shift 01/10/23 2200   DVT prophylaxis: SCDs Start: 01/10/23 2214 Code Status:   Code Status: DNR Family Communication: plan of care discussed with patient/daughter at bedside. Patient status is: Inpatient because of pain in rt hip needing surgical revision Level of care: Telemetry Medical   Dispo:The patient is from: home with daughter, independent with mobility            Anticipated disposition: SNF  Objective: Vitals last 24 hrs: Vitals:   01/11/23 1618 01/11/23 1959 01/12/23 0458 01/12/23 0820  BP: (!) 102/45 (!) 98/51 (!) 114/48 (!) 121/59  Pulse: 98 76 89 (!) 59  Resp: 17 20 18 17   Temp: 98.2 F (36.8 C) (!) 97.5 F (36.4 C) 98.7 F (37.1 C) 98 F (36.7 C)  TempSrc:  Oral Oral Oral  SpO2: 96% 90% 96% 99%  Weight:      Height:       Weight change:   Physical Examination: General exam: alert awake, frail-appearing elderly  HEENT:Oral mucosa moist, Ear/Nose WNL grossly Respiratory system: Bilaterally clear BS,no use of accessory muscle Cardiovascular system: S1 & S2 +, No JVD. Gastrointestinal system: Abdomen soft,NT,ND, BS+ Nervous System: Alert, awake, moving all extremities,and following commands. Extremities: LE edema neg,distal peripheral pulses palpable and warm.  Lue avf+ Skin: No rashes,no icterus. MSK: Normal muscle bulk,tone, power   Medications reviewed:  Scheduled Meds:  Chlorhexidine Gluconate Cloth  6 each Topical Q0600   insulin aspart  0-9 Units Subcutaneous Q4H   levothyroxine  88 mcg Oral Q0600   midodrine  10 mg Oral Q M,W,F-HD   midodrine  5 mg Oral TID WC   multivitamin  1 tablet Oral QHS    Ensure Max Protein  11 oz Oral Daily   sotalol  80 mg Oral BID   zolpidem  5 mg Oral QHS   Continuous Infusions:  methocarbamol (ROBAXIN) IV     Diet Order             Diet NPO time specified  Diet effective midnight                  Intake/Output Summary (Last 24 hours) at 01/12/2023 1026 Last data filed at 01/12/2023 0600 Gross per 24 hour  Intake 510 ml  Output 250 ml  Net 260 ml   Net IO Since Admission: 110 mL [01/12/23 1026]  Wt Readings from Last 3 Encounters:  01/10/23 98.2 kg  01/10/23 91 kg  11/19/22 99.9 kg     Unresulted Labs (From admission, onward)     Start     Ordered   01/12/23 0500  Basic metabolic panel  Daily,   R      01/11/23  5284   01/12/23 0500  CBC  Daily,   R      01/11/23 0843          Data Reviewed: I have personally reviewed following labs and imaging studies CBC: Recent Labs  Lab 01/08/23 1358 01/10/23 0929 01/11/23 0019 01/12/23 0137  WBC 6.0 6.0 5.1 5.2  NEUTROABS 4.2  --   --   --   HGB 11.2* 10.5* 11.2* 11.2*  HCT 35.5* 32.8* 34.6* 35.3*  MCV 99.7 98.2 99.7 99.4  PLT 131* 130* 129* 140*   Basic Metabolic Panel: Recent Labs  Lab 01/08/23 1358 01/10/23 0408 01/10/23 2214 01/11/23 0019 01/12/23 0137  NA 134* 134* 134* 133* 132*  K 4.4 4.7 4.4 4.7 4.6  CL 94* 94* 92* 94* 92*  CO2 28 28 28 29 27   GLUCOSE 154* 116* 123* 131* 151*  BUN 65* 85* 46* 48* 63*  CREATININE 2.93* 3.76* 2.72* 2.72* 3.29*  CALCIUM 8.7* 8.8* 8.8* 8.7* 8.7*  MG  --   --  1.9  --   --   PHOS  --   --  3.4  --   --   GFR: Estimated Creatinine Clearance: 18.4 mL/min (A) (by C-G formula based on SCr of 3.29 mg/dL (H)). Liver Function Tests: Recent Labs  Lab 01/10/23 2214 01/11/23 0019  AST 27 25  ALT 24 24  ALKPHOS 236* 250*  BILITOT 0.4 0.7  PROT 6.5 6.6  ALBUMIN 2.7* 2.7*   Recent Labs    01/10/23 2214  HGBA1C 6.0*   CBG: Recent Labs  Lab 01/11/23 1616 01/11/23 2000 01/12/23 0017 01/12/23 0459 01/12/23 0824  GLUCAP  109* 138* 142* 146* 94  Lipid Profile: No results for input(s): "CHOL", "HDL", "LDLCALC", "TRIG", "CHOLHDL", "LDLDIRECT" in the last 72 hours. Thyroid Function Tests: Recent Labs    01/10/23 2214  TSH 4.027   Sepsis Labs: No results for input(s): "PROCALCITON", "LATICACIDVEN" in the last 168 hours.  Recent Results (from the past 240 hour(s))  Surgical pcr screen     Status: None   Collection Time: 01/11/23  5:10 AM   Specimen: Nasal Mucosa; Nasal Swab  Result Value Ref Range Status   MRSA, PCR NEGATIVE NEGATIVE Final   Staphylococcus aureus NEGATIVE NEGATIVE Final    Comment: (NOTE) The Xpert SA Assay (FDA approved for NASAL specimens in patients 103 years of age and older), is one component of a comprehensive surveillance program. It is not intended to diagnose infection nor to guide or monitor treatment. Performed at [facility], [address], [place], [place] [address]     Antimicrobials: Anti-infectives (From admission, onward)    None      Culture/Microbiology    Component Value Date/Time   SDES BLOOD BLOOD LEFT ARM 05/24/2019 0021   SDES BLOOD BLOOD LEFT HAND 05/24/2019 0021   SDES  05/24/2019 0021    IN/OUT CATH URINE Performed at [facility], [address] [place] [place], [place] [address]    [facility]  05/24/2019 0021    BOTTLES DRAWN AEROBIC AND ANAEROBIC Blood Culture adequate volume   SPECREQUEST  05/24/2019 0021    BOTTLES DRAWN AEROBIC AND ANAEROBIC Blood Culture adequate volume   SPECREQUEST  05/24/2019 0021    NONE Performed at [facility], [address] [place] [place], [place] [address]    CULT  05/24/2019 0021    NO GROWTH 5 DAYS Performed at [facility], [address], [place], [place] [address]    CULT  05/24/2019 0021    NO GROWTH 5 DAYS Performed at [facility], [address] [place] [place], [place] [address]    CULT  05/24/2019 0021    NO GROWTH Performed at [facility], [address] [place], [place] [address]    REPTSTATUS 05/29/2019 FINAL 05/24/2019 0021   REPTSTATUS 05/29/2019 FINAL 05/24/2019 0021   REPTSTATUS 05/25/2019 FINAL 05/24/2019 0021    Radiology Studies: ECHOCARDIOGRAM COMPLETE  Result Date: 01/12/2023    ECHOCARDIOGRAM REPORT   Patient Name:   MAKARI ROPER Date of Exam: 01/11/2023 Medical Rec #:  [id number]       Height:       73.0 in Accession #:    [id number]      Weight:       216.5 lb Date of Birth:  Jan 13, 1931        BSA:          2.225 m Patient Age:    89 years        BP:           102/45 mmHg Patient Gender: M               HR:           60 bpm. Exam Location:  Inpatient Procedure: 2D Echo, Cardiac Doppler and Color Doppler Indications:    Abnormal ECG R94.31  History:        Patient has prior history of Echocardiogram examinations, most                 recent 01/11/2018. Cardiomyopathy and CHF, CAD, Angina and                 Previous Myocardial Infarction, Prior CABG, Arrythmias:Atrial                 Fibrillation, Signs/Symptoms:Hypotension and Shortness of                 Breath; Risk Factors:Diabetes and Dyslipidemia. ESRD.  Sonographer:    [name] Referring Phys: [id number] ANASTASSIA DOUTOVA IMPRESSIONS  1. Left ventricular ejection fraction, by estimation, is 35 to 40%. The left ventricle has moderately decreased function. The left ventricle demonstrates global hypokinesis. There is mild concentric left ventricular hypertrophy. Left ventricular diastolic parameters are indeterminate. Elevated left ventricular end-diastolic pressure.  2. Right ventricular systolic function is mildly reduced. The right ventricular size is severely enlarged. There is moderately elevated pulmonary artery systolic pressure. The estimated right ventricular systolic pressure is 53.9 mmHg.  3. The mitral valve is degenerative. Mild mitral valve regurgitation. No evidence of mitral stenosis.  4. The aortic valve is tricuspid. Aortic valve regurgitation is mild to moderate.  Aortic valve sclerosis/calcification is present, without any evidence of aortic stenosis. Aortic valve area, by VTI measures 1.89 cm. Aortic valve mean gradient measures 5.0 mmHg. Aortic valve Vmax measures 1.34 m/s.  5. Aortic dilatation noted. 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 There is mild dilatation of the ascending aorta, measuring 39 mm.  6. The inferior vena cava is dilated in size with <50% respiratory variability, suggesting right atrial pressure of 15 mmHg.  7. Recommend CHest CTA to assess ascending aortic aneurysm. FINDINGS  Left Ventricle: Left ventricular ejection fraction, by estimation, is 35 to 40%. The left ventricle has moderately decreased function. The left ventricle demonstrates global hypokinesis. The left ventricular internal cavity size was normal in size. There is mild concentric left ventricular hypertrophy. Left ventricular diastolic parameters are  indeterminate. Elevated left ventricular end-diastolic pressure. Right Ventricle: The right ventricular size is severely enlarged. No increase in right ventricular wall thickness. Right ventricular systolic function is mildly reduced. There is moderately elevated pulmonary artery systolic pressure. The tricuspid regurgitant velocity is 3.12 m/s, and with an assumed right atrial pressure of 15 mmHg, the estimated right ventricular systolic pressure is 53.9 mmHg. Left Atrium: Left atrial size was normal in size. Right Atrium: Right atrial size was normal in size. Pericardium: There is no evidence of pericardial effusion. Mitral Valve: The mitral valve is degenerative in appearance. There is mild thickening of the mitral valve leaflet(s). There is mild calcification of the mitral valve leaflet(s). Mild to moderate mitral annular calcification. Mild mitral valve regurgitation. No evidence of mitral valve stenosis. Tricuspid Valve: The tricuspid valve is normal in structure. Tricuspid valve regurgitation is mild . No evidence of  tricuspid stenosis. Aortic Valve: The aortic valve is tricuspid. Aortic valve regurgitation is mild to moderate. Aortic regurgitation PHT measures 550 msec. Aortic valve sclerosis/calcification is present, without any evidence of aortic stenosis. Aortic valve mean gradient measures 5.0 mmHg. Aortic valve peak gradient measures 7.2 mmHg. Aortic valve area, by VTI measures 1.89 cm. Pulmonic Valve: The pulmonic valve was normal in structure. Pulmonic valve regurgitation is mild. No evidence of pulmonic stenosis. Aorta: Aortic dilatation noted. There is mild dilatation of the ascending aorta, measuring 39 mm. There is an aneurysm involving the ascending aorta measuring 65 mm. Venous: The inferior vena cava is dilated in size with less than 50% respiratory variability, suggesting right atrial pressure of 15 mmHg. IAS/Shunts: No atrial level shunt detected by color flow Doppler.  LEFT VENTRICLE PLAX 2D LVIDd:         5.30 cm   Diastology LVIDs:         4.40 cm   LV e' medial:    5.25 cm/s LV PW:         1.20 cm   LV E/e' medial:  19.8 LV IVS:        1.20 cm   LV e' lateral:   9.75 cm/s LVOT diam:     2.10 cm   LV E/e' lateral: 10.7 LV SV:         67 LV SV Index:   30 LVOT Area:     3.46 cm  RIGHT VENTRICLE             IVC RV S prime:     11.20 cm/s  IVC diam: 2.40 cm TAPSE (M-mode): 1.2 cm LEFT ATRIUM             Index        RIGHT ATRIUM           Index LA diam:        4.70 cm 2.11 cm/m   RA Area:     24.80 cm LA Vol (A2C):   71.4 ml 32.09 ml/m  RA Volume:   69.90 ml  31.41 ml/m LA Vol (A4C):   70.9 ml 31.86 ml/m LA Biplane Vol: 73.6 ml 33.07 ml/m  AORTIC VALVE                    PULMONIC VALVE AV Area (Vmax):    2.26 cm     PR End Diast Vel: 3.33 msec AV Area (Vmean):   1.97 cm AV Area (VTI):     1.89 cm AV Vmax:           134.50 cm/s  AV Vmean:          99.800 cm/s AV VTI:            0.353 m AV Peak Grad:      7.2 mmHg AV Mean Grad:      5.0 mmHg LVOT Vmax:         87.68 cm/s LVOT Vmean:        56.625 cm/s  LVOT VTI:          0.192 m LVOT/AV VTI ratio: 0.55 AI PHT:            550 msec  AORTA Ao Root diam: 3.90 cm Ao Asc diam:  6.10 cm MITRAL VALVE                TRICUSPID VALVE MV Area (PHT): 4.39 cm     TR Peak grad:   38.9 mmHg MV Decel Time: 173 msec     TR Vmax:        312.00 cm/s MR Peak grad: 69.2 mmHg MR Vmax:      416.00 cm/s   SHUNTS MV E velocity: 104.00 cm/s  Systemic VTI:  0.19 m MV A velocity: 56.60 cm/s   Systemic Diam: 2.10 cm MV E/A ratio:  1.84 Armanda Magic MD Electronically signed by Armanda Magic MD Signature Date/Time: 01/12/2023/8:09:45 AM    Final    DG CHEST PORT 1 VIEW  Result Date: 01/10/2023 CLINICAL DATA:  Preop cardiovascular exam EXAM: PORTABLE CHEST 1 VIEW COMPARISON:  11/12/2022 FINDINGS: Prior CABG. Cardiomegaly. Aortic atherosclerosis. Vascular congestion. No overt edema. Linear atelectasis in the left mid lung. No visible effusions. No acute bony abnormality. IMPRESSION: Cardiomegaly, vascular congestion. Left mid lung subsegmental atelectasis. Electronically Signed   By: Charlett Nose M.D.   On: 01/10/2023 22:55     LOS: 2 days   Lanae Boast, MD [facility]  01/12/2023, 10:26 AM

## 2023-01-12 NOTE — Progress Notes (Signed)
[facility] Progress Note  Subjective: seen in room, no c/o's today. Up 5-6kg today  Vitals:   01/11/23 1959 01/12/23 0458 01/12/23 0820 01/12/23 1235  BP: (!) 98/51 (!) 114/48 (!) 121/59 (!) 111/48  Pulse: 76 89 (!) 59 76  Resp: 20 18 17 20   Temp: (!) 97.5 F (36.4 C) 98.7 F (37.1 C) 98 F (36.7 C) 97.8 F (36.6 C)  TempSrc: Oral Oral Oral Oral  SpO2: 90% 96% 99% 96%  Weight:      Height:        Exam: Gen alert, no distress, elderly WM No jvd or bruits Chest clear bilat to bases RRR no RG Abd soft ntnd no mass or ascites +bs Ext no LE edema Neuro is alert, Ox 3 , nf    LUE AVF+bruit       Home meds include - plavix, lasix 40, glipizide, renvavite, klorcon 40, crestor, synthroid, midodrine 5 tid, sotalol 80 bid, suvorexant, prns        OP HD: MWF [facility]   3.5h  92.5kg   400/ 500   2/2.5 bath   15ga   LUA AVF  Heparin 2000u then 600/hr     Assessment/ Plan: R hip pain - secondary to femoral nail migration. To OR today.  ESRD - on HD MWF. Last HD yesterday at [facility]. Plan HD tomorrow.  Hypotension - chronic on midodrine Volume - no sig vol excess on exam, up 6kg by wts. UF goal 3 L as tol.  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 Cont binder while here.  Atrial fib - on BB HFrEF / hx CABG x 4    Rob Cortavius Montesinos MD  [facility] 01/12/2023, 5:23 PM  Recent Labs  Lab 01/10/23 2214 01/11/23 0019 01/12/23 0137 01/12/23 1301  HGB  --  11.2* 11.2* 12.6*  ALBUMIN 2.7* 2.7*  --   --   CALCIUM 8.8* 8.7* 8.7*  --   PHOS 3.4  --   --   --   CREATININE 2.72* 2.72* 3.29* 3.50*  K 4.4 4.7 4.6 5.0   Recent Labs  Lab 01/10/23 2216  IRON 29*  TIBC 255  FERRITIN 920*   Inpatient medications:  [MAR Hold] Chlorhexidine Gluconate Cloth  6 each Topical Q0600   [MAR Hold] insulin aspart  0-9 Units Subcutaneous Q4H   [MAR Hold] levothyroxine  88 mcg Oral Q0600   [MAR Hold] midodrine  10 mg Oral Q M,W,F-HD   [MAR  Hold] midodrine  5 mg Oral TID WC   [MAR Hold] multivitamin  1 tablet Oral QHS   [MAR Hold] Ensure Max Protein  11 oz Oral Daily   [MAR Hold] sotalol  80 mg Oral BID   [MAR Hold] zolpidem  5 mg Oral QHS    sodium chloride 10 mL/hr at 01/12/23 1506   [MAR Hold] methocarbamol (ROBAXIN) IV     tranexamic acid     0.9 % irrigation (POUR BTL), bupivacaine liposome, bupivacaine-EPINEPHrine, [MAR Hold] HYDROcodone-acetaminophen, [MAR Hold]  HYDROmorphone (DILAUDID) injection, [MAR Hold] methocarbamol **OR** [MAR Hold] methocarbamol (ROBAXIN) IV, [MAR Hold]  morphine injection, sodium chloride (PF), sodium chloride irrigation, Surgiphor Wound Irrigation System - Optime 450 mL, Surgirinse Wound Irrigation System - Optime 450 mL irrigation

## 2023-01-12 NOTE — Care Management Important Message (Signed)
Important Message  Patient Details  Name: Matthew [name] MRN: [id number] Date of Birth: [birth year]   Medicare Important Message Given:  Yes     Sherilyn Banker 01/12/2023, 1:53 PM

## 2023-01-12 NOTE — Transfer of Care (Signed)
Immediate Anesthesia Transfer of Care Note  Patient: Matthew [name]  Procedure(s) Performed: CONVERSION TO  RIGHT TOTAL HIP REPLACEMENT (Right: Hip)  Patient Location: PACU  Anesthesia Type:General  Level of Consciousness: sedated, cooperative  Airway & Oxygen Therapy: Patient Spontanous Breathing and Patient connected to nasal cannula oxygen  Post-op Assessment: Report given to RN, Post -op Vital signs reviewed and stable, and Patient moving all extremities X 4  Post vital signs: Reviewed and stable  Last Vitals:  Vitals Value Taken Time  BP 139/71 01/12/23 1930  Temp    Pulse 72 01/12/23 1936  Resp 16 01/12/23 1936  SpO2 96 % 01/12/23 1936  Vitals shown include unfiled device data.  Last Pain:  Vitals:   01/12/23 1303  TempSrc:   PainSc: 0-No pain      Patients Stated Pain Goal: 0 (01/12/23 1303)  Complications: No notable events documented.

## 2023-01-12 NOTE — Anesthesia Preprocedure Evaluation (Addendum)
Anesthesia Evaluation  Patient identified by MRN, date of birth, ID band Patient awake    Reviewed: Allergy & Precautions, NPO status , Patient's Chart, lab work & pertinent test results  Airway Mallampati: II  TM Distance: >3 FB Neck ROM: Full    Dental  (+) Dental Advisory Given, Missing, Poor Dentition, Teeth Intact   Pulmonary shortness of breath, former smoker   Pulmonary exam normal breath sounds clear to auscultation       Cardiovascular hypertension, Pt. on medications and Pt. on home beta blockers pulmonary hypertension+ CAD, + Past MI, + CABG (2001), +CHF (LVEF 40-45%) and + DOE  + dysrhythmias Atrial Fibrillation + Valvular Problems/Murmurs (mod AI) AI and MR  Rhythm:Irregular Rate:Normal + Systolic murmurs Echo 01/11/2023  1. Left ventricular ejection fraction, by estimation, is 35 to 40%. The left ventricle has moderately decreased function. The left ventricle demonstrates global hypokinesis. There is mild concentric left ventricular hypertrophy. Left ventricular diastolic parameters are indeterminate. Elevated left ventricular end-diastolic pressure.   2. Right ventricular systolic function is mildly reduced. The right ventricular size is severely enlarged. There is moderately elevated pulmonary artery systolic pressure. The estimated right ventricular systolic pressure is 53.9 mmHg.   3. The mitral valve is degenerative. Mild mitral valve regurgitation. No evidence of mitral stenosis.   4. The aortic valve is tricuspid. Aortic valve regurgitation is mild to moderate. Aortic valve sclerosis/calcification is present, without any evidence of aortic stenosis. Aortic valve area, by VTI measures 1.89 cm. Aortic valve mean gradient measures 5.0 mmHg. Aortic valve Vmax measures 1.34 m/s.   5. Aortic dilatation noted. 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 There is mild dilatation of the ascending aorta, measuring 39 mm.    6. The inferior vena cava is dilated in size with <50% respiratory variability, suggesting right atrial pressure of 15 mmHg.   7. Recommend CHest CTA to assess ascending aortic aneurysm.    Echo 2019 - Left ventricle: The cavity size was moderately dilated. The    estimated ejection fraction was 45%. Diffuse hypokinesis.  - Aortic valve: There was moderate regurgitation.  - Right ventricle: The cavity size was moderately dilated.  - Atrial septum: No defect or patent foramen ovale was identified.  - Tricuspid valve: There was moderate regurgitation.     Neuro/Psych negative neurological ROS     GI/Hepatic negative GI ROS, Neg liver ROS,GERD  ,,  Endo/Other  diabetes, Well Controlled, Type 2, Oral Hypoglycemic AgentsHypothyroidism    Renal/GU ESRFRenal disease     Musculoskeletal  (+) Arthritis ,    Abdominal   Peds  Hematology  (+) Blood dyscrasia, anemia   Anesthesia Other Findings RIGHT HIP FRACTURE  Reproductive/Obstetrics                             Anesthesia Physical Anesthesia Plan  ASA: 4  Anesthesia Plan: General   Post-op Pain Management: Ofirmev IV (intra-op)*   Induction: Intravenous  PONV Risk Score and Plan: 3 and Ondansetron, Dexamethasone and Treatment may vary due to age or medical condition  Airway Management Planned: Oral ETT  Additional Equipment: Arterial line  Intra-op Plan:   Post-operative Plan: Possible Post-op intubation/ventilation  Informed Consent: I have reviewed the patients History and Physical, chart, labs and discussed the procedure including the risks, benefits and alternatives for the proposed anesthesia with the patient or authorized representative who has indicated his/her understanding and acceptance.   Patient has DNR.  Discussed DNR with patient, Discussed DNR with power of attorney and Suspend DNR.   Dental advisory given  Plan Discussed with: CRNA  Anesthesia Plan Comments:          Anesthesia Quick Evaluation

## 2023-01-12 NOTE — Op Note (Signed)
01/12/2023  6:32 PM  PATIENT:  Matthew [name]   MRN: [id number]  PRE-OPERATIVE DIAGNOSIS: Malunion right intertrochanteric femur fracture  POST-OPERATIVE DIAGNOSIS:  same  PROCEDURE:  Procedure(s): CONVERSION TO  RIGHT TOTAL HIP REPLACEMENT  PREOPERATIVE INDICATIONS:    Matthew [name] is an 89 y.o. male who has a diagnosis of right intertrochanteric femur fracture that went on to malunion with subsequent screw penetration through the femoral head.  This fracture was fixed approximately 2 months ago and [place] and patient had been doing well until a couple of days ago.  X-rays demonstrated fixation failure.  Recommended conversion to total hip arthroplasty.  The risks benefits and alternatives were discussed with the patient including but not limited to the risks of nonoperative treatment, versus surgical intervention including infection, bleeding, nerve injury, periprosthetic fracture, the need for revision surgery, dislocation, leg length discrepancy, blood clots, cardiopulmonary complications, morbidity, mortality, among others, and they were willing to proceed.     OPERATIVE REPORT     SURGEON:  Weber Cooks, MD    ASSISTANT: Kathie Dike, PA-C, (Present throughout the entire procedure,  necessary for completion of procedure in a timely manner, assisting with retraction, instrumentation, and closure)     ANESTHESIA: General  ESTIMATED BLOOD LOSS: 350cc    COMPLICATIONS:  None.     COMPONENTS:   Stryker Trident 258 mm acetabular shell, 6.5 hex screws x 4, Accolade 2 cemented stem size 7 with 127 degree neck angle, MDM dual mobility liner, dual mobility head ball with 28+4 ceramic inner ball Implant Name Type Inv. Item Serial No. Manufacturer Lot No. LRB No. Used Action  SLEEVE BEADED CABLE - [id number] Orthopedic Implant SLEEVE BEADED CABLE  [employer] [id number] Right 1 Implanted  SHELL ACETAB TRIDENT 58 - [id number] Shell SHELL ACETAB TRIDENT 58  [employer] [id number] Right 1 Implanted  SCREW HEX LP 6.5X35 - [id number] Screw SCREW HEX LP 6.5X35  [employer] [id number] Right 1 Implanted  SCREW HEX LP 6.5X25 - [id number] Screw SCREW HEX LP 6.5X25  [employer] [id number] Right 1 Implanted  SCREW HEX LP 6.5X35 - [id number] Screw SCREW HEX LP 6.5X35  [employer] [id number] Right 1 Implanted  SCREW HEX LP 6.5X20 - [id number] Screw SCREW HEX LP 6.5X20  [employer] [id number] Right 1 Implanted  CEMENT BONE SIMPLEX SPEEDSET - [id number] Cement CEMENT BONE SIMPLEX SPEEDSET  [employer] [id number] Right 3 Implanted  CEMENT RESTRICTOR BONE PREP ST - [id number] Cement CEMENT RESTRICTOR BONE PREP ST  [employer] [id number] Right 1 Implanted  STEM FEM CMT SZ7 V40 40X158 - [id number] Stem STEM FEM CMT SZ7 V40 40X158  [employer] [id number] Right 1 Implanted  LINER MDM - [id number] Orthopedic Implant LINER MDM  [employer] [id number] Right 1 Implanted  INSERT ADM X3 REST 28/52 - [id number] Insert INSERT ADM X3 REST 28/52  [employer] [id number] Right 1 Implanted  HIP BIOLOX HD 28/+4 - [id number] Joint HIP BIOLOX HD 28/+4  [employer] [id number] Right 1 Implanted       PROCEDURE IN DETAIL:   The patient was met in the holding area and  identified.  The appropriate hip was identified and marked at the operative site.  The patient was then transported to the OR  and  placed under anesthesia.  At that point, the patient was  placed in the lateral decubitus position with the operative side up and  secured to the operating room table  and all bony prominences padded. A subaxillary role was  also placed.    The operative lower extremity was prepped from the iliac crest to the distal leg.  Sterile draping was performed.  Preoperative antibiotics, 2 gm of ancef,1 gm of Tranexamic Acid, and 8 mg of Decadron administered. Time out was performed prior to incision.      A routine posterolateral approach was utilized via  sharp dissection  carried down to the subcutaneous tissue.  Gross bleeders were Bovie coagulated.  The iliotibial band was identified and incised along the length of the skin incision through the glute max fascia.  Charnley retractor was placed with care to protect the sciatic nerve posteriorly.  With the hip internally rotated, the piriformis tendon was identified and released from the femoral insertion and tagged with a #5 Ethibond.  A capsulotomy was then performed off the femoral insertion and also tagged with a #5 Ethibond.    The femoral neck was exposed, and I resected the femoral neck based on preoperative templating relative to the lesser trochanter.    I then exposed the deep acetabulum, cleared out any tissue including the ligamentum teres.  After adequate visualization, I excised the labrum.  I then started reaming with a *** mm reamer, first medializing to the floor of the cotyloid fossa, and then in the position of the cup aiming towards the greater sciatic notch, matching the version of the transverse acetabular ligament and tucked under the anterior wall. I reamed up to *** mm reamer with good bony bed preparation and a *** mm cup was chosen.  The real cup was then impacted into place.  Appropriate version and inclination was confirmed clinically matching their bony anatomy, and also with the use of the jig.  I placed 2 screws in the posterior superior quadrant to augment fixation.  A *** liner was placed and impacted. It was confirmed to be appropriately seated and the acetabular retractors were removed.    I then prepared the proximal femur using the box cutter, Charnley awl, and then sequentially broached starting with 0 up to a size ***.  A trial broach, neck, and head was utilized, and I reduced the hip and it was found to have excellent stability.  There was no impingement with full extension and 90 degrees external rotation.  The hip was stable at the position of sleep and with 90  degrees flexion and ***degrees of internal rotation.  Leg lengths were also clinically assessed in the lateral position and felt to be equal. Intra-Op flatplate was obtained and confirmed appropriate component positions.  Good fill of the femur with the size *** broach.  And restoration of leg length and offset. No evidence or concern for fracture.  A final femoral prosthesis size *** was selected. I then impacted the real femoral prosthesis into place.I again trialed and selected a 36+ ***mm ball. The hip was then reduced and taken through a range of motion. There was no impingement with full extension and 90 degrees external rotation.  The hip was stable at the position of sleep and with 90 degrees flexion and ***degrees of internal rotation. Leg lengths were  again assessed and felt to be restored.  We then opened, and I impacted the real head ball into place.  The posterior capsule was then closed with #5 Ethibond.  The piriformis was repaired through the base of the abductor tendon using a [place] suture passer.  I then irrigated the hip copiously with dilute Betadine and with normal saline pulse lavage. Periarticular injection was then  performed with Exparel.   We repaired the fascia #1 barbed suture, followed by 0 barbed suture for the subcutaneous fat.  Skin was closed with 2-0 Vicryl and 3-0 Monocryl.  Dermabond and Aquacel dressing were applied. The patient was then awakened and returned to PACU in stable and satisfactory condition.  Leg lengths in the supine position were assessed and felt to be clinically equal. There were no complications.  Post op recs: WB: 50% partial weightbearing right lower extremity, posterior precautions x 6 weeks Abx: ancef post op  Imaging: PACU pelvis Xray Dressing: Aquacell, keep intact until follow up DVT prophylaxis: Aspirin 81BID starting POD1, resume Plavix postop day 2 Follow up: 2 weeks after surgery for a wound check with Dr. Blanchie Dessert at [facility].  Address: [address], [place], [place] [address]  Office Phone: [phone]   Weber Cooks, MD Orthopedic Surgeon

## 2023-01-13 ENCOUNTER — Encounter (HOSPITAL_COMMUNITY): Payer: Self-pay | Admitting: Orthopedic Surgery

## 2023-01-13 ENCOUNTER — Ambulatory Visit: Payer: Medicare Other | Admitting: Cardiovascular Disease

## 2023-01-13 DIAGNOSIS — I5022 Chronic systolic (congestive) heart failure: Secondary | ICD-10-CM | POA: Diagnosis not present

## 2023-01-13 DIAGNOSIS — S72001A Fracture of unspecified part of neck of right femur, initial encounter for closed fracture: Secondary | ICD-10-CM | POA: Diagnosis not present

## 2023-01-13 LAB — BASIC METABOLIC PANEL
Anion gap: 13 (ref 5–15)
BUN: 79 mg/dL — ABNORMAL HIGH (ref 8–23)
CO2: 23 mmol/L (ref 22–32)
Calcium: 8.6 mg/dL — ABNORMAL LOW (ref 8.9–10.3)
Chloride: 98 mmol/L (ref 98–111)
Creatinine, Ser: 3.67 mg/dL — ABNORMAL HIGH (ref 0.61–1.24)
GFR, Estimated: 15 mL/min — ABNORMAL LOW (ref >60)
Glucose, Bld: 217 mg/dL — ABNORMAL HIGH (ref 70–99)
Potassium: 5.1 mmol/L (ref 3.5–5.1)
Sodium: 134 mmol/L — ABNORMAL LOW (ref 135–145)

## 2023-01-13 LAB — CBC
HCT: 32 % — ABNORMAL LOW (ref 39.0–52.0)
Hemoglobin: 10.1 g/dL — ABNORMAL LOW (ref 13.0–17.0)
MCH: 31.7 pg (ref 26.0–34.0)
MCHC: 31.6 g/dL (ref 30.0–36.0)
MCV: 100.3 fL — ABNORMAL HIGH (ref 80.0–100.0)
Platelets: 141 10*3/uL — ABNORMAL LOW (ref 150–400)
RBC: 3.19 MIL/uL — ABNORMAL LOW (ref 4.22–5.81)
RDW: 15 % (ref 11.5–15.5)
WBC: 8.6 10*3/uL (ref 4.0–10.5)
nRBC: 0 % (ref 0.0–0.2)

## 2023-01-13 LAB — GLUCOSE, CAPILLARY
Glucose-Capillary: 147 mg/dL — ABNORMAL HIGH (ref 70–99)
Glucose-Capillary: 164 mg/dL — ABNORMAL HIGH (ref 70–99)
Glucose-Capillary: 198 mg/dL — ABNORMAL HIGH (ref 70–99)
Glucose-Capillary: 221 mg/dL — ABNORMAL HIGH (ref 70–99)

## 2023-01-13 MED ORDER — HEPARIN SODIUM (PORCINE) 1000 UNIT/ML IJ SOLN
INTRAMUSCULAR | Status: AC
  Administered 2023-01-13: 1500 [IU] via INTRAVENOUS_CENTRAL
  Filled 2023-01-13: qty 3

## 2023-01-13 MED ORDER — KETOROLAC TROMETHAMINE 0.5 % OP SOLN
1.0000 [drp] | Freq: Three times a day (TID) | OPHTHALMIC | Status: DC | PRN
  Administered 2023-01-13: 1 [drp] via OPHTHALMIC
  Filled 2023-01-13: qty 5

## 2023-01-13 MED ORDER — CHLORHEXIDINE GLUCONATE CLOTH 2 % EX PADS
6.0000 | MEDICATED_PAD | Freq: Every day | CUTANEOUS | Status: DC
  Administered 2023-01-14 – 2023-01-17 (×4): 6 via TOPICAL

## 2023-01-13 MED ORDER — HEPARIN SODIUM (PORCINE) 1000 UNIT/ML DIALYSIS: 1500.0000 [IU] | INTRAMUSCULAR | Status: AC | PRN

## 2023-01-13 MED ORDER — HEPARIN SODIUM (PORCINE) 1000 UNIT/ML DIALYSIS
1500.0000 [IU] | Freq: Once | INTRAMUSCULAR | Status: AC
  Administered 2023-01-13: 1500 [IU] via INTRAVENOUS_CENTRAL

## 2023-01-13 NOTE — Progress Notes (Signed)
PROGRESS NOTE Matthew [name]  ZOX:[id number] DOB: 05-Aug-1930 DOA: 01/10/2023 PCP: Sherron Monday, MD  Brief Narrative/Hospital Course: 89 y.o.M W/ recent history of right hip fracture s/p intramedullary rod and femoral neck screw ESRD on HD MWF,A-fib, CAD, systolic CHF, type 2 diabetes, osteoporosis, HLD, PAF not on anticoagulation due to fall risk, chronic orthostatic hypotension on midodrine, CAD/history of CABG, HFmrEF, hypothyroidism, insomnia,hx of aortic dissection, presented with right hip pain and admitted to [facility] from 8/17 to 8/19 and transferred to [facility] for to be evaluated by Dr. Carola Frost for migration of the femoral nail for possible surgical revision. Seen by cardiology and preop evaluation: Echo shows a decrease in EF to 35-40% from previous 55.8% form 08/2021 8/21: s/p conversion THA with femoral neck fracture. Subjective: Seen and examined this morning Complaints of discomfort on the left Eye with redness on lower lid Daughter at the bedside No new complaints Overnight afebrile Labs are pending. Assessment and Plan: Principal Problem:   Closed right hip fracture (HCC) Active Problems:   Closed hip fracture requiring operative repair, right, sequela   End-stage renal disease on hemodialysis (HCC)   Chronic hypotension   Atrial fibrillation (HCC)   CAD (coronary artery disease)   HFrEF (heart failure with reduced ejection fraction) (HCC)   Hypothyroidism   Chronic systolic CHF (congestive heart failure) (HCC)   Dissecting aortic aneurysm, thoracic (HCC)   Preop cardiovascular exam   Recent right hip fracture s/p intramedullary rod and femoral neck screw Right hip pain with migration of femoral nail: Transferred from [facility] to [facility] for surgical revision 8/21: s/p conversion THA with femoral neck fracture. 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 Recent Labs    11/13/22 0522 11/15/22 0832 11/17/22 0436 11/18/22 0543 11/19/22 0610 01/08/23 1358 01/10/23 0408 01/10/23 2214 01/11/23 0019 01/12/23 0137 01/12/23 1301  BUN 85* 82* 55* 39* 58* 65* 85* 46* 48* 63* 77*  CREATININE 3.72* 3.72* 3.32* 2.38* 2.98* 2.93* 3.76* 2.72* 2.72* 3.29* 3.50*  CO2 18* 26 30 30 28 28 28 28 29 27   --    TDM with hypoglycemia: Hold home glipizide, keep on SSI.  No more hypoglycemia Recent Labs  Lab 01/10/23 2214 01/10/23 2307 01/12/23 1651 01/12/23 1806 01/12/23 1926 01/12/23 2211 01/13/23 0413  GLUCAP  --    < > 106* 137* 105* 141* 147*  HGBA1C 6.0*  --   --   --   --   --   --    < > = values in this interval not displayed.    Chronic hypotension: Continue midodrine.  BP stable.  History of dissecting aortic aneurysm Monitor BP.  CAD status post CABG: Asymptomatic.Intolerant to statin. Home Plavix to be resumed on postop day 2  HFrEF: echo from 08/2021 shows EF 55.8% now EF is low at 35 to 40% acute on chronic systolic dysfunction  PAF not on anticoagulation due to fall risk: off sotalol due to bradycardia per cardiology.  Hypothyroidism: Continue home Synthroid  Insomnia: On suvorexant  Recent COVID complicating his rehab stay and had treatment with Paxlovid and went home on August 3  Hypoalbuminemia: RD consult augment diet  Pressure injury coccyx stage II POA see below Pressure Injury  01/10/23 Coccyx Mid;Lower Stage 2 -  Partial thickness loss of dermis presenting as a shallow open injury with a red, pink wound bed without slough. 2 small areas on sacrum (Active)  01/10/23 2100  Location: Coccyx  Location Orientation: Mid;Lower  Staging: Stage 2 -  Partial thickness loss of dermis presenting as a shallow open injury with  a red, pink wound bed without slough.  Wound Description (Comments): 2 small areas on sacrum  Present on Admission: Yes  Dressing Type Foam - Lift dressing to assess site every shift 01/10/23 2200   DVT prophylaxis: SCDs Start: 01/10/23 2214 Code Status:   Code Status: DNR Family Communication: plan of care discussed with patient/daughter at bedside. Patient status is: Inpatient because of pain in rt hip needing surgical revision Level of care: Telemetry Medical   Dispo:The patient is from: home with daughter, independent with mobility            Anticipated disposition: SNF TBD  Objective: Vitals last 24 hrs: Vitals:   01/12/23 2144 01/13/23 0005 01/13/23 0015 01/13/23 0401  BP: (!) 134/51 135/63  (!) 123/51  Pulse: 76 83  97  Resp: 16 16  15   Temp: (!) 97.4 F (36.3 C) (!) 97.4 F (36.3 C)  (!) 97.5 F (36.4 C)  TempSrc: Oral Oral  Axillary  SpO2: 92% (!) 87% 100% 97%  Weight:      Height:       Weight change:   Physical Examination: General exam: alert awake, oriented  HEENT:Oral mucosa moist, Ear/Nose WNL grossly Respiratory system: Bilaterally clear BS,no use of accessory muscle Cardiovascular system: S1 & S2 +, No JVD. Gastrointestinal system: Abdomen soft,NT,ND, BS+ Nervous System: Alert, awake, moving all extremities,and following commands. Extremities: RT HIP W/ aquacel dressing +, LE edema neg,distal peripheral pulses palpable and warm.  Skin: No rashes,no icterus. MSK: Normal muscle bulk,tone, power   Medications reviewed:  Scheduled Meds:  aspirin EC  81 mg Oral BID   Chlorhexidine Gluconate Cloth  6 each Topical Q0600   [START ON 01/14/2023] clopidogrel  75 mg Oral Daily   insulin aspart  0-9 Units Subcutaneous Q4H   levothyroxine  88 mcg Oral Q0600   midodrine  10 mg Oral Q M,W,F-HD   midodrine  5 mg Oral TID WC   multivitamin  1 tablet Oral QHS   Ensure Max Protein  11 oz Oral Daily   sotalol  80 mg Oral BID   zolpidem  5 mg Oral QHS   Continuous  Infusions:   ceFAZolin (ANCEF) IV 200 mL/hr at 01/13/23 0600   methocarbamol (ROBAXIN) IV     Diet Order             Diet regular Room service appropriate? Yes; Fluid consistency: Thin  Diet effective now                  Intake/Output Summary (Last 24 hours) at 01/13/2023 0928 Last data filed at 01/13/2023 0645 Gross per 24 hour  Intake 850 ml  Output 700 ml  Net 150 ml   Net IO Since Admission: 260 mL [01/13/23 0928]  Wt Readings from Last 3 Encounters:  01/10/23 98.2 kg  01/10/23 91 kg  11/19/22 99.9 kg     Unresulted Labs (From admission, onward)     Start     Ordered   01/12/23 1119  Surgical pcr screen  Once,   R        01/12/23 1118   01/12/23 0500  Basic metabolic panel  Daily,   R      01/11/23 0843   01/12/23 0500  CBC  Daily,   R      01/11/23 0843          Data Reviewed: I have personally reviewed following labs and imaging studies CBC: Recent Labs  Lab 01/08/23 1358 01/10/23 0929 01/11/23 0019 01/12/23 0137 01/12/23 1301  WBC 6.0 6.0 5.1 5.2  --   NEUTROABS 4.2  --   --   --   --   HGB 11.2* 10.5* 11.2* 11.2* 12.6*  HCT 35.5* 32.8* 34.6* 35.3* 37.0*  MCV 99.7 98.2 99.7 99.4  --   PLT 131* 130* 129* 140*  --    Basic Metabolic Panel: Recent Labs  Lab 01/08/23 1358 01/10/23 0408 01/10/23 2214 01/11/23 0019 01/12/23 0137 01/12/23 1301  NA 134* 134* 134* 133* 132* 134*  K 4.4 4.7 4.4 4.7 4.6 5.0  CL 94* 94* 92* 94* 92* 96*  CO2 28 28 28 29 27   --   GLUCOSE 154* 116* 123* 131* 151* 108*  BUN 65* 85* 46* 48* 63* 77*  CREATININE 2.93* 3.76* 2.72* 2.72* 3.29* 3.50*  CALCIUM 8.7* 8.8* 8.8* 8.7* 8.7*  --   MG  --   --  1.9  --   --   --   PHOS  --   --  3.4  --   --   --   GFR: Estimated Creatinine Clearance: 17.3 mL/min (A) (by C-G formula based on SCr of 3.5 mg/dL (H)). Liver Function Tests: Recent Labs  Lab 01/10/23 2214 01/11/23 0019  AST 27 25  ALT 24 24  ALKPHOS 236* 250*  BILITOT 0.4 0.7  PROT 6.5 6.6  ALBUMIN 2.7* 2.7*    Recent Labs    01/10/23 2214  HGBA1C 6.0*   CBG: Recent Labs  Lab 01/12/23 1651 01/12/23 1806 01/12/23 1926 01/12/23 2211 01/13/23 0413  GLUCAP 106* 137* 105* 141* 147*  Lipid Profile: No results for input(s): "CHOL", "HDL", "LDLCALC", "TRIG", "CHOLHDL", "LDLDIRECT" in the last 72 hours. Thyroid Function Tests: Recent Labs    01/10/23 2214  TSH 4.027   Sepsis Labs: No results for input(s): "PROCALCITON", "LATICACIDVEN" in the last 168 hours.  Recent Results (from the past 240 hour(s))  Surgical pcr screen     Status: None   Collection Time: 01/11/23  5:10 AM   Specimen: Nasal Mucosa; Nasal Swab  Result Value Ref Range Status   MRSA, PCR NEGATIVE NEGATIVE Final   Staphylococcus aureus NEGATIVE NEGATIVE Final    Comment: (NOTE) The Xpert SA Assay (FDA approved for NASAL specimens in patients 40 years of age and older), is one component of a comprehensive surveillance program. It is not intended to diagnose infection nor to guide or monitor treatment. Performed at [facility], [address], [place], [place] [address]     Antimicrobials: Anti-infectives (From admission, onward)    Start     Dose/Rate Route Frequency Ordered Stop   01/13/23 0300  ceFAZolin (ANCEF) IVPB 2g/100 mL premix        2 g 200 mL/hr over 30 Minutes Intravenous Every 12 hours 01/12/23 2151 01/14/23 2159   01/12/23 1400  ceFAZolin (ANCEF) IVPB 2g/100 mL premix        2 g 200 mL/hr over 30 Minutes Intravenous  Once 01/12/23 1353 01/12/23 1558      Culture/Microbiology    Component Value Date/Time   SDES BLOOD BLOOD LEFT ARM  05/24/2019 0021   SDES BLOOD BLOOD LEFT HAND 05/24/2019 0021   SDES  05/24/2019 0021    IN/OUT CATH URINE Performed at [facility], [address] [place] [place], [place] [address]    SPECREQUEST  05/24/2019 0021    BOTTLES DRAWN AEROBIC AND ANAEROBIC Blood Culture adequate volume   SPECREQUEST  05/24/2019 0021    BOTTLES DRAWN AEROBIC AND  ANAEROBIC Blood Culture adequate volume   SPECREQUEST  05/24/2019 0021    NONE Performed at [facility], [address] [place] [place], [place] [address]    CULT  05/24/2019 0021    NO GROWTH 5 DAYS Performed at [facility], [address] [place] [place], [place] [address]    CULT  05/24/2019 0021    NO GROWTH 5 DAYS Performed at [facility], [address] [place] [place], [place] [address]    CULT  05/24/2019 0021    NO GROWTH Performed at [facility], [address] [place], [place] [address]    REPTSTATUS 05/29/2019 FINAL 05/24/2019 0021   REPTSTATUS 05/29/2019 FINAL 05/24/2019 0021   REPTSTATUS 05/25/2019 FINAL 05/24/2019 0021    Radiology Studies: DG HIP UNILAT W OR W/O PELVIS 2-3 VIEWS RIGHT  Result Date: 01/12/2023 CLINICAL DATA:  Postop. EXAM: DG HIP (WITH OR WITHOUT PELVIS) 2-3V RIGHT COMPARISON:  Preoperative imaging. FINDINGS: Removal of femoral intramedullary nail and trans trochanteric screw fixation. Interval right hip arthroplasty in expected alignment. Cerclage wire about the femoral stem. Chronic displacement of the lesser trochanter. The bones are subjectively under mineralized. Remote pubic rami fractures. Recent postsurgical change includes air and edema in the soft tissues. IMPRESSION: Interval right hip arthroplasty without immediate postoperative complication. Electronically Signed   By: Narda Rutherford M.D.   On: 01/12/2023 19:53   DG HIP UNILAT WITH PELVIS 1V RIGHT  Result Date: 01/12/2023 CLINICAL DATA:  Elective surgery. EXAM: DG HIP (WITH OR WITHOUT PELVIS) 1V RIGHT COMPARISON:  Preoperative imaging FINDINGS: Single fluoroscopic spot view of the right hip obtained in the operating room. Image obtained during hip arthroplasty. Fluoroscopy time 10 seconds. Dose 4.91 mGy. IMPRESSION: Intraoperative fluoroscopy during right hip arthroplasty. Electronically Signed   By: Narda Rutherford M.D.   On: 01/12/2023 18:26   DG C-Arm 1-60 Min-No  Report  Result Date: 01/12/2023 Fluoroscopy was utilized by the requesting physician.  No radiographic interpretation.   DG C-Arm 1-60 Min-No Report  Result Date: 01/12/2023 Fluoroscopy was utilized by the requesting physician.  No radiographic interpretation.   ECHOCARDIOGRAM COMPLETE  Result Date: 01/12/2023    ECHOCARDIOGRAM REPORT   Patient Name:   Matthew [name] Date of Exam: 01/11/2023 Medical Rec #:  [id number]       Height:       73.0 in Accession #:    [id number]      Weight:       216.5 lb Date of Birth:  07/10/1930        BSA:          2.225 m Patient Age:    89 years        BP:           102/45 mmHg Patient Gender: M               HR:           60 bpm. Exam Location:  Inpatient Procedure: 2D Echo, Cardiac Doppler and Color Doppler Indications:    Abnormal ECG R94.31  History:  Patient has prior history of Echocardiogram examinations, most                 recent 01/11/2018. Cardiomyopathy and CHF, CAD, Angina and                 Previous Myocardial Infarction, Prior CABG, Arrythmias:Atrial                 Fibrillation, Signs/Symptoms:Hypotension and Shortness of                 Breath; Risk Factors:Diabetes and Dyslipidemia. ESRD.  Sonographer:    [name] Referring Phys: [id number] ANASTASSIA DOUTOVA IMPRESSIONS  1. Left ventricular ejection fraction, by estimation, is 35 to 40%. The left ventricle has moderately decreased function. The left ventricle demonstrates global hypokinesis. There is mild concentric left ventricular hypertrophy. Left ventricular diastolic parameters are indeterminate. Elevated left ventricular end-diastolic pressure.  2. Right ventricular systolic function is mildly reduced. The right ventricular size is severely enlarged. There is moderately elevated pulmonary artery systolic pressure. The estimated right ventricular systolic pressure is 53.9 mmHg.  3. The mitral valve is degenerative. Mild mitral valve regurgitation. No evidence of mitral stenosis.  4. The  aortic valve is tricuspid. Aortic valve regurgitation is mild to moderate. Aortic valve sclerosis/calcification is present, without any evidence of aortic stenosis. Aortic valve area, by VTI measures 1.89 cm. Aortic valve mean gradient measures 5.0 mmHg. Aortic valve Vmax measures 1.34 m/s.  5. Aortic dilatation noted. 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 There is mild dilatation of the ascending aorta, measuring 39 mm.  6. The inferior vena cava is dilated in size with <50% respiratory variability, suggesting right atrial pressure of 15 mmHg.  7. Recommend CHest CTA to assess ascending aortic aneurysm. FINDINGS  Left Ventricle: Left ventricular ejection fraction, by estimation, is 35 to 40%. The left ventricle has moderately decreased function. The left ventricle demonstrates global hypokinesis. The left ventricular internal cavity size was normal in size. There is mild concentric left ventricular hypertrophy. Left ventricular diastolic parameters are indeterminate. Elevated left ventricular end-diastolic pressure. Right Ventricle: The right ventricular size is severely enlarged. No increase in right ventricular wall thickness. Right ventricular systolic function is mildly reduced. There is moderately elevated pulmonary artery systolic pressure. The tricuspid regurgitant velocity is 3.12 m/s, and with an assumed right atrial pressure of 15 mmHg, the estimated right ventricular systolic pressure is 53.9 mmHg. Left Atrium: Left atrial size was normal in size. Right Atrium: Right atrial size was normal in size. Pericardium: There is no evidence of pericardial effusion. Mitral Valve: The mitral valve is degenerative in appearance. There is mild thickening of the mitral valve leaflet(s). There is mild calcification of the mitral valve leaflet(s). Mild to moderate mitral annular calcification. Mild mitral valve regurgitation. No evidence of mitral valve stenosis. Tricuspid Valve: The tricuspid valve is  normal in structure. Tricuspid valve regurgitation is mild . No evidence of tricuspid stenosis. Aortic Valve: The aortic valve is tricuspid. Aortic valve regurgitation is mild to moderate. Aortic regurgitation PHT measures 550 msec. Aortic valve sclerosis/calcification is present, without any evidence of aortic stenosis. Aortic valve mean gradient measures 5.0 mmHg. Aortic valve peak gradient measures 7.2 mmHg. Aortic valve area, by VTI measures 1.89 cm. Pulmonic Valve: The pulmonic valve was normal in structure. Pulmonic valve regurgitation is mild. No evidence of pulmonic stenosis. Aorta: Aortic dilatation noted. There is mild dilatation of the ascending aorta, measuring 39 mm. There is an aneurysm involving the ascending  aorta measuring 65 mm. Venous: The inferior vena cava is dilated in size with less than 50% respiratory variability, suggesting right atrial pressure of 15 mmHg. IAS/Shunts: No atrial level shunt detected by color flow Doppler.  LEFT VENTRICLE PLAX 2D LVIDd:         5.30 cm   Diastology LVIDs:         4.40 cm   LV e' medial:    5.25 cm/s LV PW:         1.20 cm   LV E/e' medial:  19.8 LV IVS:        1.20 cm   LV e' lateral:   9.75 cm/s LVOT diam:     2.10 cm   LV E/e' lateral: 10.7 LV SV:         67 LV SV Index:   30 LVOT Area:     3.46 cm  RIGHT VENTRICLE             IVC RV S prime:     11.20 cm/s  IVC diam: 2.40 cm TAPSE (M-mode): 1.2 cm LEFT ATRIUM             Index        RIGHT ATRIUM           Index LA diam:        4.70 cm 2.11 cm/m   RA Area:     24.80 cm LA Vol (A2C):   71.4 ml 32.09 ml/m  RA Volume:   69.90 ml  31.41 ml/m LA Vol (A4C):   70.9 ml 31.86 ml/m LA Biplane Vol: 73.6 ml 33.07 ml/m  AORTIC VALVE                    PULMONIC VALVE AV Area (Vmax):    2.26 cm     PR End Diast Vel: 3.33 msec AV Area (Vmean):   1.97 cm AV Area (VTI):     1.89 cm AV Vmax:           134.50 cm/s AV Vmean:          99.800 cm/s AV VTI:            0.353 m AV Peak Grad:      7.2 mmHg AV Mean Grad:       5.0 mmHg LVOT Vmax:         87.68 cm/s LVOT Vmean:        56.625 cm/s LVOT VTI:          0.192 m LVOT/AV VTI ratio: 0.55 AI PHT:            550 msec  AORTA Ao Root diam: 3.90 cm Ao Asc diam:  6.10 cm MITRAL VALVE                TRICUSPID VALVE MV Area (PHT): 4.39 cm     TR Peak grad:   38.9 mmHg MV Decel Time: 173 msec     TR Vmax:        312.00 cm/s MR Peak grad: 69.2 mmHg MR Vmax:      416.00 cm/s   SHUNTS MV E velocity: 104.00 cm/s  Systemic VTI:  0.19 m MV A velocity: 56.60 cm/s   Systemic Diam: 2.10 cm MV E/A ratio:  1.84 Armanda Magic MD Electronically signed by Armanda Magic MD Signature Date/Time: 01/12/2023/8:09:45 AM    Final      LOS: 3 days   Lanae Boast, MD [facility]  01/13/2023, 9:28 AM

## 2023-01-13 NOTE — Evaluation (Signed)
Physical Therapy Evaluation Patient Details Name: Matthew [name] MRN: [id number] DOB: 1931-01-08 Today's Date: 01/13/2023  History of Present Illness  Pt is 89 presenting to [facility] due to developing severe pain and inability to WB on the R LE. Pt previously sustained a R intertrochanteric femur fracutre and underwent ORIF of ceph medullary nail 2 months ago. Pt is current s/p R THA on 01/12/23. PMH: Anginal pain, arthritis, AAA, A fib, bradycardia, CAD, ESRD, GERD, HFrEF, HLD, HTN, IDA, MI, S/p CABG, squamous cell carcinoma, DM II, valvular insufficiency.  Clinical Impression  Pt is presenting below baseline. Previously pt was Mod I for functional mobility. Currently pt is Mod I for sit to stand and Max A for bed mobility. Secondary to pt WB restrictions and difficulty in standing did not progress gait this session. Due to pt current functional status, home set up and available assistance at home recommending skilled physical therapy services <3 hours/day at a higher level of care and frequency (5x/weekly) on discharge from acute care hospital setting in order to decrease risk for falls, injury and re-hospitalization to return to PLOF and decrease burden of care. Pt tolerated treatment session well.         If plan is discharge home, recommend the following: Two people to help with walking and/or transfers;Help with stairs or ramp for entrance;Assist for transportation;Assistance with cooking/housework   Can travel by private vehicle   No    Equipment Recommendations Other (comment) (defer to post acute)     Functional Status Assessment Patient has had a recent decline in their functional status and demonstrates the ability to make significant improvements in function in a reasonable and predictable amount of time.     Precautions / Restrictions Precautions Precautions: Fall;Posterior Hip Restrictions Weight Bearing Restrictions: Yes RLE Weight Bearing: Partial weight bearing RLE Partial  Weight Bearing Percentage or Pounds: 50% WB      Mobility  Bed Mobility Overal bed mobility: Needs Assistance Bed Mobility: Sit to Supine, Supine to Sit     Supine to sit: Mod assist Sit to supine: Max assist   General bed mobility comments: Mod A at trunk and LE for supine to sitting and Max A at LE and for straightening out in bed for sitting to supine with verbal cues for sequencing.    Transfers Overall transfer level: Needs assistance Equipment used: Rolling walker (2 wheels) Transfers: Sit to/from Stand Sit to Stand: Mod assist, From elevated surface           General transfer comment: pt was able to scoot EOB at Mod a and Mod A for sit to stand 2x from EOB with RW.    Ambulation/Gait   General Gait Details: Did not progress gait at this time due to pt difficulty off loading operative leg. Pt is currently 50% PWD.  Stairs Stairs:  (Unable to attempt at this time.)               Balance Overall balance assessment: Needs assistance Sitting-balance support: Feet supported Sitting balance-Leahy Scale: Good       Standing balance-Leahy Scale: Poor Standing balance comment: reliance on physical therapist and AD.       Pertinent Vitals/Pain Pain Assessment Pain Location: R leg pain Pain Descriptors / Indicators: Sore Pain Intervention(s): Monitored during session, RN gave pain meds during session    Home Living Family/patient expects to be discharged to:: Private residence Living Arrangements: Children (Daughter and SIL) Available Help at Discharge: Family;Available PRN/intermittently Type of Home:  House Home Access: Stairs to enter Entrance Stairs-Rails: Doctor, general practice of Steps: 7 total (3 at driveway and 4 at doorway)   Home Layout: Able to live on main level with bedroom/bathroom;Two level Home Equipment: Cane - single point;Rollator (4 wheels);Shower seat;Grab bars - toilet;Grab bars - tub/shower;Wheelchair -  manual Additional Comments: Lives with daughter and son-in-law.    Prior Function Prior Level of Function : Independent/Modified Independent             Mobility Comments: uses rollator for ambulation as needed; not driving; ADLs Comments: daughter does cooking/cleaning; he is Mod I for bathing/dressing;     Extremity/Trunk Assessment   Upper Extremity Assessment Upper Extremity Assessment: Defer to OT evaluation    Lower Extremity Assessment Lower Extremity Assessment: Generalized weakness    Cervical / Trunk Assessment Cervical / Trunk Assessment: Normal  Communication   Communication Communication: No apparent difficulties Cueing Techniques: Verbal cues;Tactile cues;Visual cues  Cognition Arousal: Alert Behavior During Therapy: WFL for tasks assessed/performed Overall Cognitive Status: Within Functional Limits for tasks assessed   General Comments: pt oriented to self, visitors, situation        General Comments General comments (skin integrity, edema, etc.): no excessive drainage noted at incision site. Daughter present throughout session.        Assessment/Plan    PT Assessment Patient needs continued PT services  PT Problem List Decreased strength;Decreased range of motion;Decreased activity tolerance;Decreased balance;Decreased mobility;Decreased knowledge of precautions;Decreased knowledge of use of DME       PT Treatment Interventions DME instruction;Neuromuscular re-education;Gait training;Stair training;Patient/family education;Functional mobility training;Therapeutic activities;Therapeutic exercise;Balance training    PT Goals (Current goals can be found in the Care Plan section)  Acute Rehab PT Goals Patient Stated Goal: to return to PLOF PT Goal Formulation: With patient/family Time For Goal Achievement: 01/27/23 Potential to Achieve Goals: Good    Frequency Min 1X/week        AM-PAC PT "6 Clicks" Mobility  Outcome Measure Help needed  turning from your back to your side while in a flat bed without using bedrails?: A Lot Help needed moving from lying on your back to sitting on the side of a flat bed without using bedrails?: A Lot Help needed moving to and from a bed to a chair (including a wheelchair)?: A Lot Help needed standing up from a chair using your arms (e.g., wheelchair or bedside chair)?: A Lot Help needed to walk in hospital room?: Total Help needed climbing 3-5 steps with a railing? : Total 6 Click Score: 10    End of Session Equipment Utilized During Treatment: Gait belt Activity Tolerance: Patient tolerated treatment well Patient left: in bed;with call bell/phone within reach;with bed alarm set;with family/visitor present Nurse Communication: Mobility status PT Visit Diagnosis: Other abnormalities of gait and mobility (R26.89);Difficulty in walking, not elsewhere classified (R26.2);Pain;Muscle weakness (generalized) (M62.81) Pain - Right/Left: Right Pain - part of body: Hip    Time: 1610-9604 PT Time Calculation (min) (ACUTE ONLY): 41 min   Charges:   PT Evaluation $PT Eval Low Complexity: 1 Low PT Treatments $Therapeutic Activity: 23-37 mins PT General Charges $$ ACUTE PT VISIT: 1 Visit        Harrel Carina, DPT, CLT  Acute Rehabilitation Services Office: [phone] (Secure chat preferred)   Claudia Desanctis 01/13/2023, 2:02 PM

## 2023-01-13 NOTE — Plan of Care (Signed)
  Problem: Education: Goal: Knowledge of General Education information will improve Description: Including pain rating scale, medication(s)/side effects and non-pharmacologic comfort measures Outcome: Progressing   Problem: Health Behavior/Discharge Planning: Goal: Ability to manage health-related needs will improve Outcome: Progressing   Problem: Clinical Measurements: Goal: Ability to maintain clinical measurements within normal limits will improve Outcome: Progressing Goal: Will remain free from infection Outcome: Progressing Goal: Diagnostic test results will improve Outcome: Progressing Goal: Respiratory complications will improve Outcome: Progressing Goal: Cardiovascular complication will be avoided Outcome: Progressing   Problem: Activity: Goal: Risk for activity intolerance will decrease Outcome: Progressing   Problem: Nutrition: Goal: Adequate nutrition will be maintained Outcome: Progressing   Problem: Coping: Goal: Level of anxiety will decrease Outcome: Progressing   Problem: Elimination: Goal: Will not experience complications related to bowel motility Outcome: Progressing Goal: Will not experience complications related to urinary retention Outcome: Progressing   Problem: Pain Managment: Goal: General experience of comfort will improve Outcome: Progressing   Problem: Safety: Goal: Ability to remain free from injury will improve Outcome: Progressing   Problem: Skin Integrity: Goal: Risk for impaired skin integrity will decrease Outcome: Progressing   Problem: Education: Goal: Ability to describe self-care measures that may prevent or decrease complications (Diabetes Survival Skills Education) will improve Outcome: Progressing Goal: Individualized Educational Video(s) Outcome: Progressing   

## 2023-01-13 NOTE — Progress Notes (Signed)
Received patient in bed to unit.  Alert and oriented.  Informed consent signed and in chart.   TX duration: 3hours68mins  Patient tolerated well.  Transported back to the room  Alert, without acute distress.  Hand-off given to patient's nurse.   Access used: fistula Access issues: none  Total UF removed: 1300 ml Medication(s) given: none Post HD VS: 118/65 Post HD weight: unable to obtain    01/13/23 2130  Vitals  Temp (!) 97.5 F (36.4 C)  Temp Source Oral  BP 118/65  MAP (mmHg) 80  BP Location Right Leg  BP Method Automatic  Patient Position (if appropriate) Lying  Pulse Rate (!) 106  Pulse Rate Source Monitor  ECG Heart Rate 84  Resp 16  Oxygen Therapy  SpO2 100 %  O2 Device Room Air  Patient Activity (if Appropriate) In bed  Pulse Oximetry Type Continuous  Oximetry Probe Site Changed No  Post Treatment  Dialyzer Clearance Lightly streaked  Hemodialysis Intake (mL) 0 mL  Liters Processed 84  Fluid Removed (mL) 1300 mL  Tolerated HD Treatment Yes  Post-Hemodialysis Comments HD tx completed as scheduled, tolerated but UF goal not reached due to hypotension, pt has been asymptomatic.  AVG/AVF Arterial Site Held (minutes) 10 minutes  AVG/AVF Venous Site Held (minutes) 10 minutes  Note  Patient Observations pt is in bed resting  Fistula / Graft Left Upper arm Arteriovenous fistula  Placement Date/Time: 06/25/21 1255   Orientation: Left  Access Location: Upper arm  Access Type: Arteriovenous fistula  Site Condition No complications  Fistula / Graft Assessment Present;Thrill;Bruit  Status Deaccessed  Drainage Description None

## 2023-01-13 NOTE — Procedures (Signed)
HD Note:  Some information was entered later than the data was gathered due to patient care needs. The stated time with the data is accurate.  Received patient in bed to unit.   Alert and oriented.   Informed consent signed and in chart.   Access used: Left Upper Arm Fistula Access issues: None  Patient SBP was 74 at pre vitals.  Patient stated that the rest of the staff use his right lower leg for measurement.  Once the cuff was moved to the right lower leg, SBP was 95.  Continued to use this location for measurement.  Machine dialyzer clotted within the first hour of treatment.  Machine set up again and treatment re initiative.  UF was stopped at times due to lower BP.  See flow sheet for details  Hand-off given to oncoming dialysis nurse.   Matthew [name] L. Dareen Piano, RN [facility].

## 2023-01-13 NOTE — Progress Notes (Signed)
Progress Note  Patient Name: Matthew [name] Date of Encounter: 01/13/2023  Primary Cardiologist:   None   Subjective   Denies chest pain.  No SOB.  Lying flat in bed.   Inpatient Medications    Scheduled Meds:  aspirin EC  81 mg Oral BID   Chlorhexidine Gluconate Cloth  6 each Topical Q0600   [START ON 01/14/2023] clopidogrel  75 mg Oral Daily   fentaNYL       insulin aspart  0-9 Units Subcutaneous Q4H   levothyroxine  88 mcg Oral Q0600   midodrine  10 mg Oral Q M,W,F-HD   midodrine  5 mg Oral TID WC   multivitamin  1 tablet Oral QHS   Ensure Max Protein  11 oz Oral Daily   sotalol  80 mg Oral BID   zolpidem  5 mg Oral QHS   Continuous Infusions:   ceFAZolin (ANCEF) IV 200 mL/hr at 01/13/23 0600   methocarbamol (ROBAXIN) IV     PRN Meds: fentaNYL, HYDROcodone-acetaminophen, HYDROmorphone (DILAUDID) injection, methocarbamol **OR** methocarbamol (ROBAXIN) IV, morphine injection, prochlorperazine   Vital Signs    Vitals:   01/12/23 2144 01/13/23 0005 01/13/23 0015 01/13/23 0401  BP: (!) 134/51 135/63  (!) 123/51  Pulse: 76 83  97  Resp: 16 16  15   Temp: (!) 97.4 F (36.3 C) (!) 97.4 F (36.3 C)  (!) 97.5 F (36.4 C)  TempSrc: Oral Oral  Axillary  SpO2: 92% (!) 87% 100% 97%  Weight:      Height:        Intake/Output Summary (Last 24 hours) at 01/13/2023 0827 Last data filed at 01/13/2023 0645 Gross per 24 hour  Intake 850 ml  Output 700 ml  Net 150 ml   Filed Weights   01/10/23 2100  Weight: 98.2 kg    Telemetry    NA - Personally Reviewed  ECG    NA - Personally Reviewed  Physical Exam   GEN: No acute distress.   Neck: No  JVD Cardiac: RRR, 3/6 apical systolic murmur, no diastolic murmurs, rubs, or gallops.  Respiratory: Clear  to auscultation bilaterally. GI: Soft, nontender, non-distended  MS: No  edema; No deformity. Neuro:  Nonfocal  Psych: Normal affect   Labs    Chemistry Recent Labs  Lab 01/10/23 2214 01/11/23 0019  01/12/23 0137 01/12/23 1301  NA 134* 133* 132* 134*  K 4.4 4.7 4.6 5.0  CL 92* 94* 92* 96*  CO2 28 29 27   --   GLUCOSE 123* 131* 151* 108*  BUN 46* 48* 63* 77*  CREATININE 2.72* 2.72* 3.29* 3.50*  CALCIUM 8.8* 8.7* 8.7*  --   PROT 6.5 6.6  --   --   ALBUMIN 2.7* 2.7*  --   --   AST 27 25  --   --   ALT 24 24  --   --   ALKPHOS 236* 250*  --   --   BILITOT 0.4 0.7  --   --   GFRNONAA 22* 22* 17*  --   ANIONGAP 14 10 13   --      Hematology Recent Labs  Lab 01/10/23 0929 01/10/23 2216 01/11/23 0019 01/12/23 0137 01/12/23 1301  WBC 6.0  --  5.1 5.2  --   RBC 3.34* 3.47* 3.47* 3.55*  --   HGB 10.5*  --  11.2* 11.2* 12.6*  HCT 32.8*  --  34.6* 35.3* 37.0*  MCV 98.2  --  99.7 99.4  --  MCH 31.4  --  32.3 31.5  --   MCHC 32.0  --  32.4 31.7  --   RDW 15.6*  --  15.3 15.3  --   PLT 130*  --  129* 140*  --     Cardiac EnzymesNo results for input(s): "TROPONINI" in the last 168 hours. No results for input(s): "TROPIPOC" in the last 168 hours.   BNPNo results for input(s): "BNP", "PROBNP" in the last 168 hours.   DDimer No results for input(s): "DDIMER" in the last 168 hours.   Radiology    DG HIP UNILAT W OR W/O PELVIS 2-3 VIEWS RIGHT  Result Date: 01/12/2023 CLINICAL DATA:  Postop. EXAM: DG HIP (WITH OR WITHOUT PELVIS) 2-3V RIGHT COMPARISON:  Preoperative imaging. FINDINGS: Removal of femoral intramedullary nail and trans trochanteric screw fixation. Interval right hip arthroplasty in expected alignment. Cerclage wire about the femoral stem. Chronic displacement of the lesser trochanter. The bones are subjectively under mineralized. Remote pubic rami fractures. Recent postsurgical change includes air and edema in the soft tissues. IMPRESSION: Interval right hip arthroplasty without immediate postoperative complication. Electronically Signed   By: Narda Rutherford M.D.   On: 01/12/2023 19:53   DG HIP UNILAT WITH PELVIS 1V RIGHT  Result Date: 01/12/2023 CLINICAL DATA:   Elective surgery. EXAM: DG HIP (WITH OR WITHOUT PELVIS) 1V RIGHT COMPARISON:  Preoperative imaging FINDINGS: Single fluoroscopic spot view of the right hip obtained in the operating room. Image obtained during hip arthroplasty. Fluoroscopy time 10 seconds. Dose 4.91 mGy. IMPRESSION: Intraoperative fluoroscopy during right hip arthroplasty. Electronically Signed   By: Narda Rutherford M.D.   On: 01/12/2023 18:26   DG C-Arm 1-60 Min-No Report  Result Date: 01/12/2023 Fluoroscopy was utilized by the requesting physician.  No radiographic interpretation.   DG C-Arm 1-60 Min-No Report  Result Date: 01/12/2023 Fluoroscopy was utilized by the requesting physician.  No radiographic interpretation.   ECHOCARDIOGRAM COMPLETE  Result Date: 01/12/2023    ECHOCARDIOGRAM REPORT   Patient Name:   Matthew [name] Date of Exam: 01/11/2023 Medical Rec #:  [id number]       Height:       73.0 in Accession #:    [id number]      Weight:       216.5 lb Date of Birth:  07-Feb-1931        BSA:          2.225 m Patient Age:    89 years        BP:           102/45 mmHg Patient Gender: M               HR:           60 bpm. Exam Location:  Inpatient Procedure: 2D Echo, Cardiac Doppler and Color Doppler Indications:    Abnormal ECG R94.31  History:        Patient has prior history of Echocardiogram examinations, most                 recent 01/11/2018. Cardiomyopathy and CHF, CAD, Angina and                 Previous Myocardial Infarction, Prior CABG, Arrythmias:Atrial                 Fibrillation, Signs/Symptoms:Hypotension and Shortness of                 Breath; Risk Factors:Diabetes and Dyslipidemia. ESRD.  Sonographer:    [name] Referring Phys: [id number] ANASTASSIA DOUTOVA IMPRESSIONS  1. Left ventricular ejection fraction, by estimation, is 35 to 40%. The left ventricle has moderately decreased function. The left ventricle demonstrates global hypokinesis. There is mild concentric left ventricular hypertrophy. Left ventricular  diastolic parameters are indeterminate. Elevated left ventricular end-diastolic pressure.  2. Right ventricular systolic function is mildly reduced. The right ventricular size is severely enlarged. There is moderately elevated pulmonary artery systolic pressure. The estimated right ventricular systolic pressure is 53.9 mmHg.  3. The mitral valve is degenerative. Mild mitral valve regurgitation. No evidence of mitral stenosis.  4. The aortic valve is tricuspid. Aortic valve regurgitation is mild to moderate. Aortic valve sclerosis/calcification is present, without any evidence of aortic stenosis. Aortic valve area, by VTI measures 1.89 cm. Aortic valve mean gradient measures 5.0 mmHg. Aortic valve Vmax measures 1.34 m/s.  5. Aortic dilatation noted. 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 There is mild dilatation of the ascending aorta, measuring 39 mm.  6. The inferior vena cava is dilated in size with <50% respiratory variability, suggesting right atrial pressure of 15 mmHg.  7. Recommend CHest CTA to assess ascending aortic aneurysm. FINDINGS  Left Ventricle: Left ventricular ejection fraction, by estimation, is 35 to 40%. The left ventricle has moderately decreased function. The left ventricle demonstrates global hypokinesis. The left ventricular internal cavity size was normal in size. There is mild concentric left ventricular hypertrophy. Left ventricular diastolic parameters are indeterminate. Elevated left ventricular end-diastolic pressure. Right Ventricle: The right ventricular size is severely enlarged. No increase in right ventricular wall thickness. Right ventricular systolic function is mildly reduced. There is moderately elevated pulmonary artery systolic pressure. The tricuspid regurgitant velocity is 3.12 m/s, and with an assumed right atrial pressure of 15 mmHg, the estimated right ventricular systolic pressure is 53.9 mmHg. Left Atrium: Left atrial size was normal in size. Right Atrium:  Right atrial size was normal in size. Pericardium: There is no evidence of pericardial effusion. Mitral Valve: The mitral valve is degenerative in appearance. There is mild thickening of the mitral valve leaflet(s). There is mild calcification of the mitral valve leaflet(s). Mild to moderate mitral annular calcification. Mild mitral valve regurgitation. No evidence of mitral valve stenosis. Tricuspid Valve: The tricuspid valve is normal in structure. Tricuspid valve regurgitation is mild . No evidence of tricuspid stenosis. Aortic Valve: The aortic valve is tricuspid. Aortic valve regurgitation is mild to moderate. Aortic regurgitation PHT measures 550 msec. Aortic valve sclerosis/calcification is present, without any evidence of aortic stenosis. Aortic valve mean gradient measures 5.0 mmHg. Aortic valve peak gradient measures 7.2 mmHg. Aortic valve area, by VTI measures 1.89 cm. Pulmonic Valve: The pulmonic valve was normal in structure. Pulmonic valve regurgitation is mild. No evidence of pulmonic stenosis. Aorta: Aortic dilatation noted. There is mild dilatation of the ascending aorta, measuring 39 mm. There is an aneurysm involving the ascending aorta measuring 65 mm. Venous: The inferior vena cava is dilated in size with less than 50% respiratory variability, suggesting right atrial pressure of 15 mmHg. IAS/Shunts: No atrial level shunt detected by color flow Doppler.  LEFT VENTRICLE PLAX 2D LVIDd:         5.30 cm   Diastology LVIDs:         4.40 cm   LV e' medial:    5.25 cm/s LV PW:         1.20 cm   LV E/e' medial:  19.8 LV IVS:  1.20 cm   LV e' lateral:   9.75 cm/s LVOT diam:     2.10 cm   LV E/e' lateral: 10.7 LV SV:         67 LV SV Index:   30 LVOT Area:     3.46 cm  RIGHT VENTRICLE             IVC RV S prime:     11.20 cm/s  IVC diam: 2.40 cm TAPSE (M-mode): 1.2 cm LEFT ATRIUM             Index        RIGHT ATRIUM           Index LA diam:        4.70 cm 2.11 cm/m   RA Area:     24.80 cm LA Vol  (A2C):   71.4 ml 32.09 ml/m  RA Volume:   69.90 ml  31.41 ml/m LA Vol (A4C):   70.9 ml 31.86 ml/m LA Biplane Vol: 73.6 ml 33.07 ml/m  AORTIC VALVE                    PULMONIC VALVE AV Area (Vmax):    2.26 cm     PR End Diast Vel: 3.33 msec AV Area (Vmean):   1.97 cm AV Area (VTI):     1.89 cm AV Vmax:           134.50 cm/s AV Vmean:          99.800 cm/s AV VTI:            0.353 m AV Peak Grad:      7.2 mmHg AV Mean Grad:      5.0 mmHg LVOT Vmax:         87.68 cm/s LVOT Vmean:        56.625 cm/s LVOT VTI:          0.192 m LVOT/AV VTI ratio: 0.55 AI PHT:            550 msec  AORTA Ao Root diam: 3.90 cm Ao Asc diam:  6.10 cm MITRAL VALVE                TRICUSPID VALVE MV Area (PHT): 4.39 cm     TR Peak grad:   38.9 mmHg MV Decel Time: 173 msec     TR Vmax:        312.00 cm/s MR Peak grad: 69.2 mmHg MR Vmax:      416.00 cm/s   SHUNTS MV E velocity: 104.00 cm/s  Systemic VTI:  0.19 m MV A velocity: 56.60 cm/s   Systemic Diam: 2.10 cm MV E/A ratio:  1.84 Armanda Magic MD Electronically signed by Armanda Magic MD Signature Date/Time: 01/12/2023/8:09:45 AM    Final     Cardiac Studies   Echo:  1. Left ventricular ejection fraction, by estimation, is 35 to 40%. The  left ventricle has moderately decreased function. The left ventricle  demonstrates global hypokinesis. There is mild concentric left ventricular  hypertrophy. Left ventricular  diastolic parameters are indeterminate. Elevated left ventricular  end-diastolic pressure.   2. Right ventricular systolic function is mildly reduced. The right  ventricular size is severely enlarged. There is moderately elevated  pulmonary artery systolic pressure. The estimated right ventricular  systolic pressure is 53.9 mmHg.   3. The mitral valve is degenerative. Mild mitral valve regurgitation. No  evidence of mitral stenosis.   4. The aortic valve  is tricuspid. Aortic valve regurgitation is mild to  moderate. Aortic valve sclerosis/calcification is present,  without any  evidence of aortic stenosis. Aortic valve area, by VTI measures 1.89 cm.  Aortic valve mean gradient measures  5.0 mmHg. Aortic valve Vmax measures 1.34 m/s.   5. Aortic dilatation noted. Aneurysm of the ascending aorta, measuring 65  mm. There is mild dilatation of the ascending aorta, measuring 39 mm.   6. The inferior vena cava is dilated in size with <50% respiratory  variability, suggesting right atrial pressure of 15 mmHg.   7. Recommend CHest CTA to assess ascending aortic aneurysm.    Patient Profile     89 y.o. male with a hx of CAD status post CABG 2001, paroxysmal atrial fibrillation (not on DOAC due to falls), chronic HFmrEF (normalization of EF), ESRD, hyperlipidemia, chronic orthostatic hypotension on midodrine, recent right hip fracture status post intramedullary rod and femoral neck screw, diabetes, osteoporosis, aortic dissection, who is being seen 01/11/2023 for the evaluation of preoperative evaluation at the request of Dr Jonathon Bellows.   Assessment & Plan    Post rght hip fracture (surgical revision due to migrating nail):    Doing well post surgery without obvious cardiac complications.  No change in therapy.  See below.    Hx HFmrEF:  EF above is lower than previous.   It had been in the 45% range in the past.  Most recently reported to be 55%.  Seems to be euvolemic.  Continue volume management per dialysis.  I will stop Lasix.    I don't suspect a recent event to explain the moderately reduced EF.  At this point I will not titrate meds as he is followed closely by another cardiology group and I don't have access to those out patient records to understand previous attempts at GDMT.    Paroxysmal atrial fibrillation:  Looks like persistent atrial fib.  Rate was slow and sotalol held.  I will discontinue this.  I discussed this with the patient and his daughter.  He is asymptomatic in fib and again rate is controlled or slow.  I think he can pursue rate control.  He is  not on DOAC per his primary cardiologist.     History of dissecting aortic aneurysm:  Medical management.      For questions or updates, please contact [facility] Please consult [email or web address] for contact info under Cardiology/STEMI.   Signed, Rollene Rotunda, MD  01/13/2023, 8:27 AM

## 2023-01-13 NOTE — Progress Notes (Signed)
[facility] Progress Note  Subjective: awaiting HD today  Vitals:   01/13/23 0005 01/13/23 0015 01/13/23 0401 01/13/23 0800  BP: 135/63  (!) 123/51 (!) 98/52  Pulse: 83  97 60  Resp: 16  15 17   Temp: (!) 97.4 F (36.3 C)  (!) 97.5 F (36.4 C) 98.2 F (36.8 C)  TempSrc: Oral  Axillary Oral  SpO2: (!) 87% 100% 97%   Weight:      Height:        Exam: Gen alert, no distress, elderly WM No jvd or bruits Chest clear bilat to bases RRR no RG Abd soft ntnd no mass or ascites +bs Ext no LE edema Neuro is alert, Ox 3 , nf    LUE AVF+bruit       Home meds include - plavix, lasix 40, glipizide, renvavite, klorcon 40, crestor, synthroid, midodrine 5 tid, sotalol 80 bid, suvorexant, prns        OP HD: MWF [facility]   3.5h  92.5kg   400/ 500   2/2.5 bath   15ga   LUA AVF  Heparin 2000u then 600/hr     Assessment/ Plan: R hip pain - secondary to femoral nail migration. To OR today.  ESRD - on HD MWF. Last HD Monday at [facility]. HD today off schedule, then again tomorrow to get back on schedule.  Hypotension - chronic on midodrine Volume - no sig vol excess on exam, up 6kg by wts. UF goal 3 L as tol.  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 Cont binder while here.  Atrial fib - on BB HFrEF / hx CABG x 4    Rob Trust Crago MD  [facility] 01/13/2023, 1:13 PM  Recent Labs  Lab 01/10/23 2214 01/11/23 0019 01/12/23 0137 01/12/23 1301 01/13/23 1235  HGB  --  11.2* 11.2* 12.6* 10.1*  ALBUMIN 2.7* 2.7*  --   --   --   CALCIUM 8.8* 8.7* 8.7*  --  8.6*  PHOS 3.4  --   --   --   --   CREATININE 2.72* 2.72* 3.29* 3.50* 3.67*  K 4.4 4.7 4.6 5.0 5.1   Recent Labs  Lab 01/10/23 2216  IRON 29*  TIBC 255  FERRITIN 920*   Inpatient medications:  aspirin EC  81 mg Oral BID   Chlorhexidine Gluconate Cloth  6 each Topical Q0600   [START ON 01/14/2023] clopidogrel  75 mg Oral Daily   insulin aspart  0-9 Units Subcutaneous Q4H    levothyroxine  88 mcg Oral Q0600   midodrine  10 mg Oral Q M,W,F-HD   midodrine  5 mg Oral TID WC   multivitamin  1 tablet Oral QHS   Ensure Max Protein  11 oz Oral Daily   zolpidem  5 mg Oral QHS     ceFAZolin (ANCEF) IV 2 g (01/13/23 1107)   methocarbamol (ROBAXIN) IV     HYDROcodone-acetaminophen, HYDROmorphone (DILAUDID) injection, ketorolac, methocarbamol **OR** methocarbamol (ROBAXIN) IV, morphine injection, prochlorperazine

## 2023-01-13 NOTE — Progress Notes (Signed)
     Subjective:  Patient reports pain as mild.  Doing well this morning. Was confused overnight but answering questions appropriately this morning. Daughter at bedside.   Objective:   VITALS:   Vitals:   01/12/23 2144 01/13/23 0005 01/13/23 0015 01/13/23 0401  BP: (!) 134/51 135/63  (!) 123/51  Pulse: 76 83  97  Resp: 16 16  15   Temp: (!) 97.4 F (36.3 C) (!) 97.4 F (36.3 C)  (!) 97.5 F (36.4 C)  TempSrc: Oral Oral  Axillary  SpO2: 92% (!) 87% 100% 97%  Weight:      Height:        Sensation intact distally Intact pulses distally Dorsiflexion/Plantar flexion intact Incision: dressing C/D/I, minimal strikethrough on distal incision Compartment soft    Lab Results  Component Value Date   WBC 5.2 01/12/2023   HGB 12.6 (L) 01/12/2023   HCT 37.0 (L) 01/12/2023   MCV 99.4 01/12/2023   PLT 140 (L) 01/12/2023   BMET    Component Value Date/Time   NA 134 (L) 01/12/2023 1301   NA 140 09/16/2022 1003   K 5.0 01/12/2023 1301   CL 96 (L) 01/12/2023 1301   CO2 27 01/12/2023 0137   GLUCOSE 108 (H) 01/12/2023 1301   BUN 77 (H) 01/12/2023 1301   BUN 50 (H) 09/16/2022 1003   CREATININE 3.50 (H) 01/12/2023 1301   CALCIUM 8.7 (L) 01/12/2023 0137   EGFR 18 (L) 09/16/2022 1003   GFRNONAA 17 (L) 01/12/2023 0137    Xray: THA components in good position no adverse features  Assessment/Plan: 1 Day Post-Op   Principal Problem:   Closed right hip fracture (HCC) Active Problems:   CAD (coronary artery disease)   Chronic hypotension   Hypothyroidism   Chronic systolic CHF (congestive heart failure) (HCC)   End-stage renal disease on hemodialysis (HCC)   Atrial fibrillation (HCC)   Dissecting aortic aneurysm, thoracic (HCC)   HFrEF (heart failure with reduced ejection fraction) (HCC)   Closed hip fracture requiring operative repair, right, sequela   Preop cardiovascular exam  S/p Conversion THA with femoral neck fracture 01/12/23  Post op recs: WB: 50% partial  weightbearing right lower extremity, posterior precautions x 6 weeks Abx: ancef post op  Imaging: PACU pelvis Xray Dressing: Aquacell, keep intact until follow up DVT prophylaxis: Aspirin 81BID starting POD1, resume Plavix postop day 2 Follow up: 2 weeks after surgery for a wound check with Dr. Blanchie Dessert at [facility].  Address: [address], [place], [place] [address]  Office Phone: [phone]   Joen Laura 01/13/2023, 7:04 AM   Weber Cooks, MD  Contact information:   [phone] 7am-5pm epic message Dr. Blanchie Dessert, or call office for patient follow up: [phone] After hours and holidays please check [email or web address] for group call information for [facility]

## 2023-01-14 ENCOUNTER — Ambulatory Visit: Payer: Medicare Other | Admitting: Internal Medicine

## 2023-01-14 DIAGNOSIS — S72001A Fracture of unspecified part of neck of right femur, initial encounter for closed fracture: Secondary | ICD-10-CM | POA: Diagnosis not present

## 2023-01-14 LAB — GLUCOSE, CAPILLARY
Glucose-Capillary: 171 mg/dL — ABNORMAL HIGH (ref 70–99)
Glucose-Capillary: 218 mg/dL — ABNORMAL HIGH (ref 70–99)
Glucose-Capillary: 191 mg/dL — ABNORMAL HIGH (ref 70–99)
Glucose-Capillary: 232 mg/dL — ABNORMAL HIGH (ref 70–99)
Glucose-Capillary: 191 mg/dL — ABNORMAL HIGH (ref 70–99)

## 2023-01-14 LAB — CBC
HCT: 26.6 % — ABNORMAL LOW (ref 39.0–52.0)
Hemoglobin: 8.6 g/dL — ABNORMAL LOW (ref 13.0–17.0)
MCH: 30.9 pg (ref 26.0–34.0)
MCHC: 32.3 g/dL (ref 30.0–36.0)
MCV: 95.7 fL (ref 80.0–100.0)
Platelets: 148 10*3/uL — ABNORMAL LOW (ref 150–400)
RBC: 2.78 MIL/uL — ABNORMAL LOW (ref 4.22–5.81)
RDW: 15.1 % (ref 11.5–15.5)
WBC: 10.1 10*3/uL (ref 4.0–10.5)
nRBC: 0.2 % (ref 0.0–0.2)

## 2023-01-14 LAB — BASIC METABOLIC PANEL
Anion gap: 13 (ref 5–15)
BUN: 44 mg/dL — ABNORMAL HIGH (ref 8–23)
CO2: 25 mmol/L (ref 22–32)
Calcium: 8.2 mg/dL — ABNORMAL LOW (ref 8.9–10.3)
Chloride: 93 mmol/L — ABNORMAL LOW (ref 98–111)
Creatinine, Ser: 2.59 mg/dL — ABNORMAL HIGH (ref 0.61–1.24)
GFR, Estimated: 23 mL/min — ABNORMAL LOW (ref >60)
Glucose, Bld: 200 mg/dL — ABNORMAL HIGH (ref 70–99)
Potassium: 3.9 mmol/L (ref 3.5–5.1)
Sodium: 131 mmol/L — ABNORMAL LOW (ref 135–145)

## 2023-01-14 MED ORDER — CEFADROXIL 500 MG PO CAPS
500.0000 mg | ORAL_CAPSULE | Freq: Two times a day (BID) | ORAL | 0 refills | Status: DC
Start: 2023-01-14 — End: 2023-01-14

## 2023-01-14 MED ORDER — CEFADROXIL 500 MG PO CAPS: 500.0000 mg | ORAL_CAPSULE | Freq: Two times a day (BID) | ORAL | 0 refills | Status: DC

## 2023-01-14 MED ORDER — HEPARIN SODIUM (PORCINE) 1000 UNIT/ML IJ SOLN
2000.0000 [IU] | Freq: Once | INTRAMUSCULAR | Status: AC
  Administered 2023-01-14: 2000 [IU]

## 2023-01-14 MED ORDER — HYDROCODONE-ACETAMINOPHEN 5-325 MG PO TABS: 1.0000 | ORAL_TABLET | ORAL | 0 refills | Status: DC | PRN

## 2023-01-14 MED ORDER — HEPARIN SODIUM (PORCINE) 1000 UNIT/ML IJ SOLN
2000.0000 [IU] | Freq: Once | INTRAMUSCULAR | Status: AC
  Administered 2023-01-14: 2000 [IU] via INTRAVENOUS

## 2023-01-14 MED ORDER — HEPARIN SODIUM (PORCINE) 1000 UNIT/ML DIALYSIS
2000.0000 [IU] | Freq: Once | INTRAMUSCULAR | Status: DC
  Filled 2023-01-14: qty 2

## 2023-01-14 MED ORDER — HEPARIN SODIUM (PORCINE) 1000 UNIT/ML DIALYSIS
2000.0000 [IU] | INTRAMUSCULAR | Status: DC | PRN
  Filled 2023-01-14: qty 2

## 2023-01-14 MED ORDER — ALBUMIN HUMAN 25 % IV SOLN
25.0000 g | Freq: Once | INTRAVENOUS | Status: AC | PRN
  Administered 2023-01-14: 25 g via INTRAVENOUS
  Filled 2023-01-14: qty 100

## 2023-01-14 NOTE — TOC Progression Note (Addendum)
Transition of Care [facility]) - Progression Note    Patient Details  Name: Matthew [name] MRN: [id number] Date of Birth: 1931/02/18  [facility] CM/SW Contact  Carley Hammed, LCSW Phone Number: 01/14/2023, 12:20 PM  Clinical Narrative:     CSW noted pt's dtr is listed as Legal Guardian and contacted her to discuss disposition. [name] confirms she is POA, not LG, and that her father still maintains full legal decision making. She states they have had the conversation and pt is agreeable to returning to [facility]. CSW will follow up with pt when he is back from HD to confirm. CSW to send referral to [facility]. Facility notified, awaiting response. TOC will continue to follow for DC needs.   Per [facility], there is no admissions person or admissions until Monday.   CSW spoke with pt and family at bedside and advised that [facility] is not taking admissions until Monday. TOC will continue to follow for DC needs. Expected Discharge Plan: Skilled Nursing Facility Barriers to Discharge: SNF Pending bed offer  Expected Discharge Plan and Services       Living arrangements for the past 2 months: Single Family Home                 DME Arranged:  (await post op evaluation)         HH Arranged:  (Await post op evaluation)           Social Determinants of Health (SDOH) Interventions SDOH Screenings   Food Insecurity: No Food Insecurity (01/11/2023)  Housing: Low Risk  (01/11/2023)  Transportation Needs: No Transportation Needs (01/11/2023)  Utilities: Not At Risk (01/11/2023)  Depression (PHQ2-9): Low Risk  (09/21/2022)  Financial Resource Strain: Low Risk  (01/10/2018)  Physical Activity: Inactive (01/10/2018)  Social Connections: Socially Integrated (01/10/2018)  Stress: No Stress Concern Present (01/10/2018)  Tobacco Use: Medium Risk (01/12/2023)    Readmission Risk Interventions    01/09/2023    4:42 PM 02/12/2021    3:28 PM  Readmission Risk  Prevention Plan  Transportation Screening Complete Complete  PCP or Specialist Appt within 3-5 Days Complete   HRI or Home Care Consult Complete   Social Work Consult for Recovery Care Planning/Counseling Complete   Palliative Care Screening Not Applicable   Medication Review Oceanographer) Complete Complete  SW Recovery Care/Counseling Consult  Complete  Skilled Nursing Facility  Complete

## 2023-01-14 NOTE — Progress Notes (Signed)
 [facility] Progress Note  Subjective: pt seen in HD. Got 2.5 L off w/ HD today, brief drop in BP's to 80s.    Vitals:   01/14/23 1230 01/14/23 1236 01/14/23 1242 01/14/23 1306  BP: (!) 88/64 (!) 111/56 (!) 119/51   Pulse: (!) 108 98 83   Resp: 13 15 14    Temp:  97.6 F (36.4 C)    TempSrc:  Oral    SpO2: 97% 100% 98%   Weight:    90.9 kg  Height:        Exam: Gen alert, no distress, elderly WM No jvd or bruits Chest clear bilat to bases RRR no RG Abd soft ntnd no mass or ascites +bs Ext no LE edema Neuro is alert, Ox 3 , nf    LUE AVF+bruit       Home meds include - plavix, lasix 40, glipizide, renvavite, klorcon 40, crestor, synthroid, midodrine 5 tid, sotalol 80 bid, suvorexant, prns        OP HD: MWF [facility]   3.5h  92.5kg   400/ 500   2/2.5 bath   15ga   LUA AVF  Heparin 2000u then 600/hr     Assessment/ Plan: R hip pain - secondary to femoral nail migration. To OR today.  ESRD - on HD MWF. HD today on schedule.  Hypotension - chronic on midodrine Volume - no sig vol excess on exam, UF 2.5 L today. 1kg under dry wt post hd.  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 Cont binder while here.  Atrial fib - on BB HFrEF / hx CABG x 4    Rob Laveyah Oriol MD  [facility] 01/14/2023, 2:03 PM  Recent Labs  Lab 01/10/23 2214 01/11/23 0019 01/12/23 0137 01/13/23 1235 01/14/23 0404  HGB  --  11.2*   < > 10.1* 8.6*  ALBUMIN 2.7* 2.7*  --   --   --   CALCIUM 8.8* 8.7*   < > 8.6* 8.2*  PHOS 3.4  --   --   --   --   CREATININE 2.72* 2.72*   < > 3.67* 2.59*  K 4.4 4.7   < > 5.1 3.9   < > = values in this interval not displayed.   Recent Labs  Lab 01/10/23 2216  IRON 29*  TIBC 255  FERRITIN 920*   Inpatient medications:  Chlorhexidine Gluconate Cloth  6 each Topical Q0600   clopidogrel  75 mg Oral Daily   insulin aspart  0-9 Units Subcutaneous Q4H   levothyroxine  88 mcg Oral Q0600   midodrine  10 mg Oral  Q M,W,F-HD   midodrine  5 mg Oral TID WC   multivitamin  1 tablet Oral QHS   Ensure Max Protein  11 oz Oral Daily   zolpidem  5 mg Oral QHS     ceFAZolin (ANCEF) IV 2 g (01/14/23 0809)   methocarbamol (ROBAXIN) IV     HYDROcodone-acetaminophen, HYDROmorphone (DILAUDID) injection, ketorolac, methocarbamol **OR** methocarbamol (ROBAXIN) IV, morphine injection, prochlorperazine

## 2023-01-14 NOTE — Plan of Care (Signed)

## 2023-01-14 NOTE — [place] FL2 (Signed)
[place] MEDICAID FL2 LEVEL OF CARE FORM     IDENTIFICATION  Patient Name: Matthew [name] Birthdate: Jan 23, 1931 Sex: male Admission Date (Current Location): 01/10/2023  [facility] and [place] Number:  Producer, television/film/video and Address:  [facility] [place], [address] [place], [place] [address]      Provider Number: [id number]  Attending Physician Name and Address:  Lanae Boast, MD  Relative Name and Phone Number:  [name] (Daughter)    Current Level of Care: Hospital Recommended Level of Care: Skilled Nursing Facility Prior Approval Number:    Date Approved/Denied:   PASRR Number: [id number]  Discharge Plan: SNF    Current Diagnoses: Patient Active Problem List   Diagnosis Date Noted   Preop cardiovascular exam 01/12/2023   Closed right hip fracture (HCC) 01/10/2023   Hip fracture (HCC) 01/08/2023   Drop in hemoglobin 11/16/2022   Hyperkalemia 11/15/2022   Closed hip fracture requiring operative repair, right, sequela 11/13/2022   HFrEF (heart failure with reduced ejection fraction) (HCC) 11/12/2022   Transaminitis 11/12/2022   Age-related osteoporosis with current pathological fracture with routine healing 02/18/2021   Pressure injury of skin 02/15/2021   Pelvic fracture (HCC) 02/11/2021   End-stage renal disease on hemodialysis (HCC)    Chronic hypotension 12/26/2020   Tremor    Hypothyroidism    Chronic systolic HF (heart failure) (HCC)    Proximal humerus fracture 12/25/2020   Malposition of peritoneal dialysis catheter (HCC) 11/01/2020   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   Secondary hyperparathyroidism (HCC) 04/30/2018   Sepsis (HCC) 02/15/2018   Cardiomyopathy (HCC) 01/24/2018   Fluid overload 01/10/2018   Climacteric arthritis, lower leg 12/15/2017   Mitral regurgitation 12/15/2017   Hypoglycemia 12/09/2017   Osteoarthritis of knee 06/23/2016   Undiagnosed cardiac murmurs 09/04/2015   Aneurysm of thoracic aorta (HCC)  08/04/2015   Congestive heart failure (HCC) 08/04/2015   Postsurgical aortocoronary bypass status 08/04/2015   Chronic kidney disease 07/18/2015   Aortic root dilatation (HCC) 07/18/2015   Dissecting aortic aneurysm, 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/2015   Edema of both legs 07/18/2015   Shortness of breath 07/18/2015   Atrial fibrillation (HCC) 01/10/2014   Abnormal electrocardiogram 01/10/2014   Encounter for counseling 01/10/2014   Other general symptoms 01/10/2014   CAD (coronary artery disease) 12/04/2013   Unstable angina; Class III Angina 12/03/2013   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) 12/03/2013   HLD (hyperlipidemia) 12/03/2013   Heart disease 12/03/2013   Disorder of kidney and ureter 05/04/2013   Gastrointestinal hemorrhage 05/02/2012   Allergic rhinitis due to pollen 01/06/2007   Reflux esophagitis 01/06/2007   Overweight 10/06/2006    Orientation RESPIRATION BLADDER Height & Weight     Self, Time, Situation, Place  Normal Continent Weight: 206 lb 2.1 oz (93.5 kg) (bed) Height:  6\' 1"  (185.4 cm)  BEHAVIORAL SYMPTOMS/MOOD NEUROLOGICAL BOWEL NUTRITION STATUS      Continent Diet (See DC summary)  AMBULATORY STATUS COMMUNICATION OF NEEDS Skin   Extensive Assist Verbally Surgical wounds, PU Stage and Appropriate Care                       Personal Care Assistance Level of Assistance  Bathing, Feeding, Dressing Bathing Assistance: Maximum assistance Feeding assistance: Limited assistance Dressing Assistance: Maximum assistance     Functional Limitations Info  Sight, Hearing, Speech Sight Info: Adequate Hearing Info: Adequate Speech Info: Adequate    SPECIAL CARE FACTORS FREQUENCY  PT (By licensed PT), OT (By licensed OT)     PT Frequency: 5x week OT Frequency: 5 x week            Contractures Contractures Info: Not present    Additional Factors Info  Code Status, Allergies, Insulin Sliding Scale Code Status Info: DNR Allergies Info:  Statin drugs, Amoxicillin   Insulin Sliding Scale Info: See DC summary       Current Medications (01/14/2023):  This is the current hospital active medication list Current Facility-Administered Medications  Medication Dose Route Frequency Provider Last Rate Last Admin   ceFAZolin (ANCEF) IVPB 2g/100 mL premix  2 g Intravenous Q12H Joen Laura, MD 200 mL/hr at 01/14/23 0809 2 g at 01/14/23 0809   Chlorhexidine Gluconate Cloth 2 % PADS 6 each  6 each Topical Q0600 Delano Metz, MD   6 each at 01/14/23 [phone]   clopidogrel (PLAVIX) tablet 75 mg  75 mg Oral Daily Kathie Dike M, PA-C   75 mg at 01/14/23 0800   [START ON 01/15/2023] heparin injection 2,000 Units  2,000 Units Dialysis PRN Delano Metz, MD       Melene Muller ON 01/15/2023] heparin injection 2,000 Units  2,000 Units Dialysis Once in dialysis Delano Metz, MD       HYDROcodone-acetaminophen (NORCO/VICODIN) 5-325 MG per tablet 1-2 tablet  1-2 tablet Oral Q6H PRN Cecil Cobbs, PA-C   1 tablet at 01/13/23 1307   HYDROmorphone (DILAUDID) injection 0.5-1 mg  0.5-1 mg Intravenous Q3H PRN Kathie Dike M, PA-C       insulin aspart (novoLOG) injection 0-9 Units  0-9 Units Subcutaneous Q4H Cecil Cobbs, PA-C   3 Units at 01/14/23 9811   ketorolac (ACULAR) 0.5 % ophthalmic solution 1 drop  1 drop Left Eye TID PRN Lanae Boast, MD   1 drop at 01/13/23 1104   levothyroxine (SYNTHROID) tablet 88 mcg  88 mcg Oral Q0600 Cecil Cobbs, PA-C   88 mcg at 01/14/23 9147   methocarbamol (ROBAXIN) tablet 500 mg  500 mg Oral Q6H PRN Kathie Dike M, PA-C       Or   methocarbamol (ROBAXIN) 500 mg in dextrose 5 % 50 mL IVPB  500 mg Intravenous Q6H PRN Kathie Dike M, PA-C       midodrine (PROAMATINE) tablet 10 mg  10 mg Oral Q M,W,F-HD Kathie Dike M, PA-C   10 mg at 01/14/23 1114   midodrine (PROAMATINE) tablet 5 mg  5 mg Oral TID WC Kathie Dike M, PA-C   5 mg at 01/13/23 1104   morphine (PF) 2 MG/ML  injection 0.5 mg  0.5 mg Intravenous Q2H PRN Kathie Dike M, PA-C       multivitamin (RENA-VIT) tablet 1 tablet  1 tablet Oral QHS Kathie Dike M, PA-C   1 tablet at 01/13/23 2252   prochlorperazine (COMPAZINE) injection 5 mg  5 mg Intravenous Q6H PRN Joen Laura, MD       protein supplement (ENSURE MAX) liquid  11 oz Oral Daily Kathie Dike M, PA-C   11 oz at 01/14/23 0810   zolpidem (AMBIEN) tablet 5 mg  5 mg Oral QHS Kathie Dike M, PA-C   5 mg at 01/13/23 2252     Discharge Medications: Please see discharge summary for a list of discharge medications.  Relevant Imaging Results:  Relevant Lab Results:   Additional Information SS #: [id number]. [facility] MWF 11:15  Carley Hammed, LCSW

## 2023-01-14 NOTE — Plan of Care (Signed)
  Problem: Education: Goal: Knowledge of General Education information will improve Description: Including pain rating scale, medication(s)/side effects and non-pharmacologic comfort measures Outcome: Progressing   Problem: Clinical Measurements: Goal: Will remain free from infection Outcome: Progressing   Problem: Pain Managment: Goal: General experience of comfort will improve Outcome: Progressing   

## 2023-01-14 NOTE — Anesthesia Postprocedure Evaluation (Signed)
Anesthesia Post Note  Patient: Matthew [name]  Procedure(s) Performed: CONVERSION TO  RIGHT TOTAL HIP REPLACEMENT (Right: Hip)     Patient location during evaluation: PACU Anesthesia Type: General Level of consciousness: sedated and patient cooperative Pain management: pain level controlled Vital Signs Assessment: post-procedure vital signs reviewed and stable Respiratory status: spontaneous breathing Cardiovascular status: stable Anesthetic complications: no   No notable events documented.  Last Vitals:  Vitals:   01/14/23 1242 01/14/23 1645  BP: (!) 119/51 (!) 109/57  Pulse: 83 85  Resp: 14 17  Temp:  36.6 C  SpO2: 98% 95%    Last Pain:  Vitals:   01/14/23 1413  TempSrc:   PainSc: 6                  [name]

## 2023-01-14 NOTE — Progress Notes (Signed)
Physical Therapy Treatment Patient Details Name: Matthew [name] MRN: [id number] DOB: Apr 14, 1931 Today's Date: 01/14/2023   History of Present Illness Pt is 89 presenting to [facility] due to developing severe pain and inability to WB on the R LE. Pt previously sustained a R intertrochanteric femur fracutre and underwent ORIF of ceph medullary nail 2 months ago. Pt is current s/p R THA on 01/12/23. PMH: Anginal pain, arthritis, AAA, A fib, bradycardia, CAD, ESRD, GERD, HFrEF, HLD, HTN, IDA, MI, S/p CABG, squamous cell carcinoma, DM II, valvular insufficiency.    PT Comments  Pt progressing slowly, limited mostly by PWB R LE.  Emphasis on THA exercise, transition via bridging to EOB then up and scooting symetrically to EOB and sit to stands into the RW at EOB with feedback for maintaining PWB 50%.     If plan is discharge home, recommend the following: Two people to help with walking and/or transfers;Help with stairs or ramp for entrance;Assist for transportation;Assistance with cooking/housework   Can travel by private vehicle     No  Equipment Recommendations  Other (comment) (TBD)    Recommendations for Other Services       Precautions / Restrictions Precautions Precautions: Fall;Posterior Hip Restrictions Weight Bearing Restrictions: Yes RLE Weight Bearing: Partial weight bearing RLE Partial Weight Bearing Percentage or Pounds: 50% WB     Mobility  Bed Mobility Overal bed mobility: Needs Assistance Bed Mobility: Sit to Supine, Supine to Sit     Supine to sit: Mod assist Sit to supine: Max assist   General bed mobility comments: hooklying to bridge with mod A and cues.  Mod to max for truncal assist up and forward.  pt also with some difficulty scooting forward without throwing his weight forward more than 90 deg    Transfers Overall transfer level: Needs assistance Equipment used: Rolling walker (2 wheels) Transfers: Sit to/from Stand Sit to Stand: Max assist (from non  elevated surface x2 with R LE feedback for [facility])                Ambulation/Gait             Pre-gait activities: stood in the RW x2  working on unweighting R LE with bil LE.  Attempting swing to with PWB R LE     Stairs             Wheelchair Mobility     Tilt Bed    Modified Rankin (Stroke Patients Only)       Balance Overall balance assessment: Needs assistance   Sitting balance-Leahy Scale: Good       Standing balance-Leahy Scale: Poor Standing balance comment: reliance on physical therapist and AD.                            Cognition Arousal: Alert Behavior During Therapy: WFL for tasks assessed/performed Overall Cognitive Status: Within Functional Limits for tasks assessed                                          Exercises Total Joint Exercises Quad Sets: AROM, Both, 10 reps, Supine Gluteal Sets: AROM, Both, 10 reps, Supine Heel Slides: AROM, AAROM, Both, 10 reps, Supine Hip ABduction/ADduction: AAROM, Right, 10 reps, Supine Straight Leg Raises: AROM, AAROM, Both, 10 reps, Supine Knee Flexion: AAROM, Right, 10 reps, Supine  General Comments        Pertinent Vitals/Pain Pain Assessment Pain Assessment: Faces Faces Pain Scale: Hurts little more Pain Location: R leg pain Pain Descriptors / Indicators: Sore Pain Intervention(s): Monitored during session, Limited activity within patient's tolerance    Home Living                          Prior Function            PT Goals (current goals can now be found in the care plan section) Acute Rehab PT Goals PT Goal Formulation: With patient Time For Goal Achievement: 01/27/23 Potential to Achieve Goals: Good Progress towards PT goals: Progressing toward goals    Frequency    Min 1X/week      PT Plan      Co-evaluation PT/OT/SLP Co-Evaluation/Treatment:  (OOB /w/bearing activity)            AM-PAC PT "6 Clicks" Mobility    Outcome Measure  Help needed turning from your back to your side while in a flat bed without using bedrails?: A Lot Help needed moving from lying on your back to sitting on the side of a flat bed without using bedrails?: A Lot Help needed moving to and from a bed to a chair (including a wheelchair)?: A Lot Help needed standing up from a chair using your arms (e.g., wheelchair or bedside chair)?: Total Help needed to walk in hospital room?: Total Help needed climbing 3-5 steps with a railing? : Total 6 Click Score: 9    End of Session Equipment Utilized During Treatment: Gait belt Activity Tolerance: Patient tolerated treatment well Patient left: in bed;with call bell/phone within reach;with bed alarm set;with family/visitor present Nurse Communication: Mobility status PT Visit Diagnosis: Other abnormalities of gait and mobility (R26.89);Difficulty in walking, not elsewhere classified (R26.2);Pain;Muscle weakness (generalized) (M62.81) Pain - Right/Left: Right Pain - part of body: Hip     Time: 6295-2841 PT Time Calculation (min) (ACUTE ONLY): 27 min  Charges:    $Therapeutic Exercise: 8-22 mins $Therapeutic Activity: 8-22 mins PT General Charges $$ ACUTE PT VISIT: 1 Visit                     01/14/2023  Jacinto Halim., PT Acute Rehabilitation Services [phone]  (office)   Eliseo Gum Sible Straley 01/14/2023, 6:58 PM

## 2023-01-14 NOTE — Progress Notes (Signed)
   01/14/23 1100  During Treatment Monitoring  Intra-Hemodialysis Comments  (UL off due to low BP, denies symptoms of SOB, dizziness, Chest pain, lightheadness.  Albumin and midodrine given per Dr. Arlean Hopping.)

## 2023-01-14 NOTE — Progress Notes (Signed)
    2 Days Post-Op Procedure(s) (LRB): CONVERSION TO  RIGHT TOTAL HIP REPLACEMENT (Right)  Subjective:  Patient reports pain as mild, slept okay. Doing well this morning. Participated with PT though unable to ambulate. Daughter at bedside. PWB of RLE.  Hgb appears stable, can restart Plavix today.  Objective:   VITALS:   Vitals:   01/13/23 2109 01/13/23 2130 01/13/23 2222 01/14/23 0352  BP:  118/65 (!) 110/59 (!) 107/59  Pulse: 74 (!) 106 69 79  Resp: 16 16 18 17   Temp:  (!) 97.5 F (36.4 C) 98.2 F (36.8 C) 97.8 F (36.6 C)  TempSrc:  Oral Oral Oral  SpO2: 98% 100% 97% 98%  Weight:      Height:        AAOx4, sitting comfortably Sensation intact distally Intact pulses distally Dorsiflexion/Plantar flexion intact Incision: dressing C/D/I, minimal strikethrough on distal incision Compartment soft Wiggles toes appropriately   Lab Results  Component Value Date   WBC 10.1 01/14/2023   HGB 8.6 (L) 01/14/2023   HCT 26.6 (L) 01/14/2023   MCV 95.7 01/14/2023   PLT 148 (L) 01/14/2023   BMET    Component Value Date/Time   NA 131 (L) 01/14/2023 0404   NA 140 09/16/2022 1003   K 3.9 01/14/2023 0404   CL 93 (L) 01/14/2023 0404   CO2 25 01/14/2023 0404   GLUCOSE 200 (H) 01/14/2023 0404   BUN 44 (H) 01/14/2023 0404   BUN 50 (H) 09/16/2022 1003   CREATININE 2.59 (H) 01/14/2023 0404   CALCIUM 8.2 (L) 01/14/2023 0404   EGFR 18 (L) 09/16/2022 1003   GFRNONAA 23 (L) 01/14/2023 0404    Xray: THA components in good position no adverse features  Assessment/Plan: 2 Days Post-Op   Principal Problem:   Closed right hip fracture (HCC) Active Problems:   CAD (coronary artery disease)   Chronic hypotension   Hypothyroidism   Chronic systolic HF (heart failure) (HCC)   End-stage renal disease on hemodialysis (HCC)   Atrial fibrillation (HCC)   Dissecting aortic aneurysm, thoracic (HCC)   HFrEF (heart failure with reduced ejection fraction) (HCC)   Closed hip fracture  requiring operative repair, right, sequela   Preop cardiovascular exam  S/p Conversion THA with femoral neck fracture 01/12/23  Post op recs: WB: 50% partial weightbearing right lower extremity, posterior precautions x 6 weeks Abx: ancef post op  Imaging: PACU pelvis Xray Dressing: Aquacell, keep intact until follow up DVT prophylaxis: Aspirin 81BID starting POD1, resume Plavix postop day 2 Follow up: 2 weeks after surgery for a wound check with Dr. Blanchie Dessert at [facility].  Address: [address], [place], [place] [address]  Office Phone: [phone]   Cecil Cobbs 01/14/2023, 6:39 AM   Weber Cooks, MD  Contact information:   [phone] 7am-5pm epic message Dr. Blanchie Dessert, or call office for patient follow up: [phone] After hours and holidays please check [email or web address] for group call information for [facility]

## 2023-01-14 NOTE — Progress Notes (Signed)
Received patient in bed to unit.  Alert and oriented.  Informed consent signed and in chart.   TX duration:3 hours 15 min  Patient blood pressure was low.  Dr. Tomasa Blase made aware and midodrine and albumin (see MAR) used to stabilize BP. Transported back to the room  Alert, without acute distress.  Hand-off given to patient's nurse.   Access used: LUA fistula Access issues: none  Total UF removed: Medication(s) given: Midodrine, albumin, per Dr. Tomasa Blase order   01/14/23 1236  Vitals  Temp 97.6 F (36.4 C)  Temp Source Oral  BP (!) 111/56  MAP (mmHg) 69  BP Location Right Leg  BP Method Automatic  Patient Position (if appropriate) Lying  Pulse Rate 98  Pulse Rate Source Monitor  ECG Heart Rate 88  Resp 15  MEWS COLOR  MEWS Score Color Green  Oxygen Therapy  SpO2 100 %  O2 Device Room Air  MEWS Score  MEWS Temp 0  MEWS Systolic 0  MEWS Pulse 0  MEWS RR 0  MEWS LOC 0  MEWS Score 0     Stacie Glaze LPN [facility]

## 2023-01-14 NOTE — Progress Notes (Signed)
PT Cancellation Note  Patient Details Name: Matthew [name] MRN: [id number] DOB: [birth year]   Cancelled Treatment:    Reason Eval/Treat Not Completed: Patient at procedure or test/unavailable (Pt in HD in AM. Will continue to follow up as able and appropriate. Pt had HD yesterday as well and is now caught up for MWF schedule.)  Harrel Carina, DPT, CLT  Acute Rehabilitation Services Office: [phone] (Secure chat preferred)   Claudia Desanctis 01/14/2023, 4:37 PM

## 2023-01-14 NOTE — Addendum Note (Signed)
Addendum  created 01/14/23 1710 by [name], MD   Intraprocedure Event deleted

## 2023-01-14 NOTE — Addendum Note (Signed)
Addendum  created 01/14/23 1659 by [name], MD   Clinical Note Signed

## 2023-01-14 NOTE — Progress Notes (Signed)
PROGRESS NOTE Matthew [name]  IHK:[id number] DOB: [birth year] DOA: 01/10/2023 PCP: Sherron Monday, MD  Brief Narrative/Hospital Course: 89 y.o.M W/ recent history of right hip fracture s/p intramedullary rod and femoral neck screw ESRD on HD MWF,A-fib, CAD, systolic CHF, type 2 diabetes, osteoporosis, HLD, PAF not on anticoagulation due to fall risk, chronic orthostatic hypotension on midodrine, CAD/history of CABG, HFmrEF, hypothyroidism, insomnia,hx of aortic dissection, presented with right hip pain and admitted to [facility] from 8/17 to 8/19 and transferred to [facility] for to be evaluated by Dr. Carola Frost for migration of the femoral nail for possible surgical revision. Seen by cardiology and preop evaluation: Echo shows a decrease in EF to 35-40% from previous 55.8% form 08/2021 8/21: s/p conversion THA with femoral neck fracture. Postop he is doing well  Subjective:  Seen and examined this morning in dialysis resting comfortably no complaints playing on the phone.  He is on room air.  Labs reviewed hemoglobin at 8.6 g  Labs are pending. Assessment and Plan: Principal Problem:   Closed right hip fracture (HCC) Active Problems:   Closed hip fracture requiring operative repair, right, sequela   End-stage renal disease on hemodialysis (HCC)   Chronic hypotension   Atrial fibrillation (HCC)   CAD (coronary artery disease)   HFrEF (heart failure with reduced ejection fraction) (HCC)   Hypothyroidism   Chronic systolic HF (heart failure) (HCC)   Dissecting aortic aneurysm, thoracic (HCC)   Preop cardiovascular exam   Recent right hip fracture s/p intramedullary rod and femoral neck screw Right hip pain with migration of femoral nail: Transferred from [facility] to [facility] for surgical revision 8/21: s/p conversion THA with femoral neck fracture. Postop surgically stable appreciate input from orthopedic continue aspirin for DVT prophylaxis back on Plavix.  Continue follow-up in 2 weeks for surgery for  wound check continue Aquacel until follow-up follow-up x-ray Cont PTOT  ESRD on HD MWF Anemia CKD MBD: Getting dialysis 8/23 per nephrology appreciate input.  Does have some expected drop in hemoglobin with acute blood loss anemia asymptomatic.  Monitor Recent Labs    11/17/22 0436 11/18/22 0543 11/19/22 0610 01/08/23 1358 01/10/23 0408 01/10/23 2214 01/11/23 0019 01/12/23 0137 01/12/23 1301 01/13/23 1235 01/14/23 0404  BUN 55* 39* 58* 65* 85* 46* 48* 63* 77* 79* 44*  CREATININE 3.32* 2.38* 2.98* 2.93* 3.76* 2.72* 2.72* 3.29* 3.50* 3.67* 2.59*  CO2 30 30 28 28 28 28 29 27   --  23 25   Recent Labs  Lab 01/11/23 0019 01/12/23 0137 01/12/23 1301 01/13/23 1235 01/14/23 0404  HGB 11.2* 11.2* 12.6* 10.1* 8.6*  HCT 34.6* 35.3* 37.0* 32.0* 26.6*    TDM with hypoglycemia: Hold home glipizide, keep on SSI.  No more hypoglycemia Recent Labs  Lab 01/10/23 2214 01/10/23 2307 01/13/23 1121 01/13/23 1553 01/13/23 2354 01/14/23 0354 01/14/23 0814  GLUCAP  --    < > 164* 221* 198* 171* 218*  HGBA1C 6.0*  --   --   --   --   --   --    < > = values in this interval not displayed.   History of dissecting aortic aneurysm  Chronic hypotension: Continue midodrine  CAD status post CABG: Asymptomatic.Intolerant to statin. Cont his asa, plavix  HFrEF: echo from 08/2021 shows EF 55.8% now EF is low at 35 to 40% acute on chronic systolic dysfunction  PAF not on anticoagulation due to fall risk: off sotalol due to bradycardia per cardiology.  Hypothyroidism: Continue home Synthroid  Insomnia: On suvorexant  Recent COVID complicating his rehab stay and had treatment with Paxlovid and went home on August 3  Hypoalbuminemia: RD consult augment diet  Pressure injury coccyx stage II POA see below Pressure Injury 01/10/23 Coccyx Mid;Lower Stage 2 -  Partial thickness loss of dermis presenting as a shallow open injury with a red, pink wound bed without slough. 2 small areas on  sacrum (Active)  01/10/23 2100  Location: Coccyx  Location Orientation: Mid;Lower  Staging: Stage 2 -  Partial thickness loss of dermis presenting as a shallow open injury with a red, pink wound bed without slough.  Wound Description (Comments): 2 small areas on sacrum  Present on Admission: Yes  Dressing Type Foam - Lift dressing to assess site every shift 01/10/23 2200   DVT prophylaxis: SCDs Start: 01/10/23 2214 Code Status:   Code Status: DNR Family Communication: plan of care discussed with patient/daughter at bedside. Patient status is: Inpatient because of pain in rt hip needing surgical revision Level of care: Telemetry Medical   Dispo:The patient is from: home with daughter, independent with mobility            Anticipated disposition: SNF once available.  Medically stable  Objective: Vitals last 24 hrs: Vitals:   01/14/23 1055 01/14/23 1100 01/14/23 1115 01/14/23 1130  BP: (!) 83/63 (!) 89/64 (!) 118/59 121/60  Pulse: 87 78 93 90  Resp: 13 15 15 13   Temp:      TempSrc:      SpO2: 96% 98% 98% 97%  Weight:      Height:       Weight change:   Physical Examination: General exam: alert awake, oriented  HEENT:Oral mucosa moist, Ear/Nose WNL grossly Respiratory system: Bilaterally clear BS,no use of accessory muscle Cardiovascular system: S1 & S2 +, No JVD. Gastrointestinal system: Abdomen soft,NT,ND, BS+ Nervous System: Alert, awake, moving all extremities,and following commands. Extremities: Right hip surgical site with dressing in place,distal peripheral pulses palpable and warm.  Skin: No rashes,no icterus. MSK: Normal muscle bulk,tone, power   Medications reviewed:  Scheduled Meds:  Chlorhexidine Gluconate Cloth  6 each Topical Q0600   clopidogrel  75 mg Oral Daily   [START ON 01/15/2023] heparin  2,000 Units Dialysis Once in dialysis   insulin aspart  0-9 Units Subcutaneous Q4H   levothyroxine  88 mcg Oral Q0600   midodrine  10 mg Oral Q M,W,F-HD    midodrine  5 mg Oral TID WC   multivitamin  1 tablet Oral QHS   Ensure Max Protein  11 oz Oral Daily   zolpidem  5 mg Oral QHS   Continuous Infusions:   ceFAZolin (ANCEF) IV 2 g (01/14/23 0809)   methocarbamol (ROBAXIN) IV     Diet Order             Diet regular Room service appropriate? Yes; Fluid consistency: Thin  Diet effective now                  Intake/Output Summary (Last 24 hours) at 01/14/2023 1155 Last data filed at 01/14/2023 0409 Gross per 24 hour  Intake 350 ml  Output 1300 ml  Net -950 ml   Net IO Since Admission: -690 mL [01/14/23 1155]  Wt Readings from Last 3 Encounters:  01/14/23 93.5 kg  01/10/23 91 kg  11/19/22 99.9 kg     Unresulted Labs (From admission, onward)     Start     Ordered   01/12/23 1119  Surgical  pcr screen  Once,   R        01/12/23 1118   01/12/23 0500  Basic metabolic panel  Daily,   R      01/11/23 0843   01/12/23 0500  CBC  Daily,   R      01/11/23 0843          Data Reviewed: I have personally reviewed following labs and imaging studies CBC: Recent Labs  Lab 01/08/23 1358 01/10/23 0929 01/11/23 0019 01/12/23 0137 01/12/23 1301 01/13/23 1235 01/14/23 0404  WBC 6.0 6.0 5.1 5.2  --  8.6 10.1  NEUTROABS 4.2  --   --   --   --   --   --   HGB 11.2* 10.5* 11.2* 11.2* 12.6* 10.1* 8.6*  HCT 35.5* 32.8* 34.6* 35.3* 37.0* 32.0* 26.6*  MCV 99.7 98.2 99.7 99.4  --  100.3* 95.7  PLT 131* 130* 129* 140*  --  141* 148*   Basic Metabolic Panel: Recent Labs  Lab 01/10/23 2214 01/11/23 0019 01/12/23 0137 01/12/23 1301 01/13/23 1235 01/14/23 0404  NA 134* 133* 132* 134* 134* 131*  K 4.4 4.7 4.6 5.0 5.1 3.9  CL 92* 94* 92* 96* 98 93*  CO2 28 29 27   --  23 25  GLUCOSE 123* 131* 151* 108* 217* 200*  BUN 46* 48* 63* 77* 79* 44*  CREATININE 2.72* 2.72* 3.29* 3.50* 3.67* 2.59*  CALCIUM 8.8* 8.7* 8.7*  --  8.6* 8.2*  MG 1.9  --   --   --   --   --   PHOS 3.4  --   --   --   --   --   GFR: Estimated Creatinine  Clearance: 21.4 mL/min (A) (by C-G formula based on SCr of 2.59 mg/dL (H)). Liver Function Tests: Recent Labs  Lab 01/10/23 2214 01/11/23 0019  AST 27 25  ALT 24 24  ALKPHOS 236* 250*  BILITOT 0.4 0.7  PROT 6.5 6.6  ALBUMIN 2.7* 2.7*   No results for input(s): "HGBA1C" in the last 72 hours.  CBG: Recent Labs  Lab 01/13/23 1121 01/13/23 1553 01/13/23 2354 01/14/23 0354 01/14/23 0814  GLUCAP 164* 221* 198* 171* 218*  Lipid Profile: No results for input(s): "CHOL", "HDL", "LDLCALC", "TRIG", "CHOLHDL", "LDLDIRECT" in the last 72 hours. Thyroid Function Tests: No results for input(s): "TSH", "T4TOTAL", "FREET4", "T3FREE", "THYROIDAB" in the last 72 hours.  Sepsis Labs: No results for input(s): "PROCALCITON", "LATICACIDVEN" in the last 168 hours.  Recent Results (from the past 240 hour(s))  Surgical pcr screen     Status: None   Collection Time: 01/11/23  5:10 AM   Specimen: Nasal Mucosa; Nasal Swab  Result Value Ref Range Status   MRSA, PCR NEGATIVE NEGATIVE Final   Staphylococcus aureus NEGATIVE NEGATIVE Final    Comment: (NOTE) The Xpert SA Assay (FDA approved for NASAL specimens in patients 72 years of age and older), is one component of a comprehensive surveillance program. It is not intended to diagnose infection nor to guide or monitor treatment. Performed at [facility], [address], [place], [place] [address]     Antimicrobials: Anti-infectives (From admission, onward)    Start     Dose/Rate Route Frequency Ordered Stop   01/14/23 0000  cefadroxil (DURICEF) 500 MG capsule  Status:  Discontinued        500 mg Oral 2 times daily 01/14/23 0645 01/14/23    01/14/23 0000  cefadroxil (DURICEF) 500 MG capsule  500 mg Oral 2 times daily 01/14/23 0646 01/21/23 2359   01/13/23 0300  ceFAZolin (ANCEF) IVPB 2g/100 mL premix        2 g 200 mL/hr over 30 Minutes Intravenous Every 12 hours 01/12/23 2151 01/14/23 2159   01/12/23 1400  ceFAZolin (ANCEF)  IVPB 2g/100 mL premix        2 g 200 mL/hr over 30 Minutes Intravenous  Once 01/12/23 1353 01/12/23 1558      Culture/Microbiology    Component Value Date/Time   SDES BLOOD BLOOD LEFT ARM 05/24/2019 0021   SDES BLOOD BLOOD LEFT HAND 05/24/2019 0021   SDES  05/24/2019 0021    IN/OUT CATH URINE Performed at [facility], [address] [place] [place], [place] [address]    SPECREQUEST  05/24/2019 0021    BOTTLES DRAWN AEROBIC AND ANAEROBIC Blood Culture adequate volume   SPECREQUEST  05/24/2019 0021    BOTTLES DRAWN AEROBIC AND ANAEROBIC Blood Culture adequate volume   SPECREQUEST  05/24/2019 0021    NONE Performed at [facility], [address] [place] [place], [place] [address]    CULT  05/24/2019 0021    NO GROWTH 5 DAYS Performed at [facility], [address] [place] [place], [place] [address]    CULT  05/24/2019 0021    NO GROWTH 5 DAYS Performed at [facility], [address] [place] [place], [place] [address]    CULT  05/24/2019 0021    NO GROWTH Performed at [facility], [address], [place], [place] [address]    REPTSTATUS 05/29/2019 FINAL 05/24/2019 0021   REPTSTATUS 05/29/2019 FINAL 05/24/2019 0021   REPTSTATUS 05/25/2019 FINAL 05/24/2019 0021    Radiology Studies: DG HIP UNILAT W OR W/O PELVIS 2-3 VIEWS RIGHT  Result Date: 01/12/2023 CLINICAL DATA:  Postop. EXAM: DG HIP (WITH OR WITHOUT PELVIS) 2-3V RIGHT COMPARISON:  Preoperative imaging. FINDINGS: Removal of femoral intramedullary nail and trans trochanteric screw fixation. Interval right hip arthroplasty in expected alignment. Cerclage wire about the femoral stem. Chronic displacement of the lesser trochanter. The bones are subjectively under mineralized. Remote pubic rami fractures. Recent postsurgical change includes air and edema in the soft tissues. IMPRESSION: Interval right hip arthroplasty without immediate postoperative complication. Electronically Signed   By: Narda Rutherford  M.D.   On: 01/12/2023 19:53   DG HIP UNILAT WITH PELVIS 1V RIGHT  Result Date: 01/12/2023 CLINICAL DATA:  Elective surgery. EXAM: DG HIP (WITH OR WITHOUT PELVIS) 1V RIGHT COMPARISON:  Preoperative imaging FINDINGS: Single fluoroscopic spot view of the right hip obtained in the operating room. Image obtained during hip arthroplasty. Fluoroscopy time 10 seconds. Dose 4.91 mGy. IMPRESSION: Intraoperative fluoroscopy during right hip arthroplasty. Electronically Signed   By: Narda Rutherford M.D.   On: 01/12/2023 18:26   DG C-Arm 1-60 Min-No Report  Result Date: 01/12/2023 Fluoroscopy was utilized by the requesting physician.  No radiographic interpretation.   DG C-Arm 1-60 Min-No Report  Result Date: 01/12/2023 Fluoroscopy was utilized by the requesting physician.  No radiographic interpretation.     LOS: 4 days   Lanae Boast, MD [facility]  01/14/2023, 11:55 AM

## 2023-01-15 DIAGNOSIS — S72001A Fracture of unspecified part of neck of right femur, initial encounter for closed fracture: Secondary | ICD-10-CM | POA: Diagnosis not present

## 2023-01-15 LAB — BASIC METABOLIC PANEL
Anion gap: 13 (ref 5–15)
BUN: 38 mg/dL — ABNORMAL HIGH (ref 8–23)
CO2: 28 mmol/L (ref 22–32)
Calcium: 8.4 mg/dL — ABNORMAL LOW (ref 8.9–10.3)
Chloride: 93 mmol/L — ABNORMAL LOW (ref 98–111)
Creatinine, Ser: 2.63 mg/dL — ABNORMAL HIGH (ref 0.61–1.24)
GFR, Estimated: 22 mL/min — ABNORMAL LOW (ref >60)
Glucose, Bld: 144 mg/dL — ABNORMAL HIGH (ref 70–99)
Potassium: 4.3 mmol/L (ref 3.5–5.1)
Sodium: 134 mmol/L — ABNORMAL LOW (ref 135–145)

## 2023-01-15 LAB — CBC
HCT: 26.6 % — ABNORMAL LOW (ref 39.0–52.0)
Hemoglobin: 8.5 g/dL — ABNORMAL LOW (ref 13.0–17.0)
MCH: 31.6 pg (ref 26.0–34.0)
MCHC: 32 g/dL (ref 30.0–36.0)
MCV: 98.9 fL (ref 80.0–100.0)
Platelets: 136 10*3/uL — ABNORMAL LOW (ref 150–400)
RBC: 2.69 MIL/uL — ABNORMAL LOW (ref 4.22–5.81)
RDW: 15.7 % — ABNORMAL HIGH (ref 11.5–15.5)
WBC: 10.4 10*3/uL (ref 4.0–10.5)
nRBC: 0.2 % (ref 0.0–0.2)

## 2023-01-15 LAB — GLUCOSE, CAPILLARY
Glucose-Capillary: 147 mg/dL — ABNORMAL HIGH (ref 70–99)
Glucose-Capillary: 125 mg/dL — ABNORMAL HIGH (ref 70–99)
Glucose-Capillary: 139 mg/dL — ABNORMAL HIGH (ref 70–99)
Glucose-Capillary: 136 mg/dL — ABNORMAL HIGH (ref 70–99)
Glucose-Capillary: 152 mg/dL — ABNORMAL HIGH (ref 70–99)
Glucose-Capillary: 186 mg/dL — ABNORMAL HIGH (ref 70–99)

## 2023-01-15 NOTE — Progress Notes (Signed)
[facility] Progress Note  Subjective: pt seen in room. 2.5 L off w/ HD yesterday.   Vitals:   01/14/23 2046 01/14/23 2313 01/15/23 0443 01/15/23 0814  BP: (!) 88/38 (!) 92/58 (!) 102/50 (!) 109/49  Pulse: 82   64  Resp: 19  16 17   Temp: 97.6 F (36.4 C)  98.3 F (36.8 C) 97.8 F (36.6 C)  TempSrc: Oral  Oral Oral  SpO2: 100%  99% 99%  Weight:      Height:        Exam: Gen alert, no distress, elderly WM No jvd or bruits Chest clear bilat to bases RRR no RG Abd soft ntnd no mass or ascites +bs Ext no LE edema Neuro is alert, Ox 3 , nf    LUE AVF+bruit       Home meds include - plavix, lasix 40, glipizide, renvavite, klorcon 40, crestor, synthroid, midodrine 5 tid, sotalol 80 bid, suvorexant, prns        OP HD: MWF [facility]   3.5h  92.5kg   400/ 500   2/2.5 bath   15ga   LUA AVF  Heparin 2000u then 600/hr     Assessment/ Plan: R hip pain - secondary to femoral nail migration. SP THR surgery on 8/21.  ESRD - on HD MWF. Next HD Monday. .  Hypotension - chronic on midodrine Volume - no sig vol excess on exam, UF 2.5 L yesterday, down to just below dry wt. Stable.  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 Cont binder while here.  Atrial fib - on BB HFrEF / hx CABG x 4    Rob Srah Ake MD  [facility] 01/15/2023, 12:48 PM  Recent Labs  Lab 01/10/23 2214 01/11/23 0019 01/12/23 0137 01/14/23 0404 01/15/23 0316  HGB  --  11.2*   < > 8.6* 8.5*  ALBUMIN 2.7* 2.7*  --   --   --   CALCIUM 8.8* 8.7*   < > 8.2* 8.4*  PHOS 3.4  --   --   --   --   CREATININE 2.72* 2.72*   < > 2.59* 2.63*  K 4.4 4.7   < > 3.9 4.3   < > = values in this interval not displayed.   Recent Labs  Lab 01/10/23 2216  IRON 29*  TIBC 255  FERRITIN 920*   Inpatient medications:  Chlorhexidine Gluconate Cloth  6 each Topical Q0600   clopidogrel  75 mg Oral Daily   insulin aspart  0-9 Units Subcutaneous Q4H   levothyroxine  88 mcg Oral  Q0600   midodrine  10 mg Oral Q M,W,F-HD   midodrine  5 mg Oral TID WC   multivitamin  1 tablet Oral QHS   Ensure Max Protein  11 oz Oral Daily   zolpidem  5 mg Oral QHS    methocarbamol (ROBAXIN) IV     HYDROcodone-acetaminophen, HYDROmorphone (DILAUDID) injection, ketorolac, methocarbamol **OR** methocarbamol (ROBAXIN) IV, morphine injection, prochlorperazine

## 2023-01-15 NOTE — Progress Notes (Addendum)
Physical Therapy Treatment Patient Details Name: Matthew [name] MRN: [id number] DOB: 1931/03/07 Today's Date: 01/15/2023   History of Present Illness Pt is 89 presenting to [facility] due to developing severe pain and inability to WB on the R LE. Pt previously sustained a R intertrochanteric femur fracutre and underwent ORIF of ceph medullary nail 2 months ago. Pt is current s/p R THA on 01/12/23. PMH: Anginal pain, arthritis, AAA, A fib, bradycardia, CAD, ESRD, GERD, HFrEF, HLD, HTN, IDA, MI, S/p CABG, squamous cell carcinoma, DM II, valvular insufficiency.    PT Comments  Pt received in supine, agreeable to therapy session, and able to recall 2/3 posterior hip precautions. Pt limited due to pain, has not received pain meds in >24 hours, RN called to room to given pain meds during session. Pt able to perform bed mobility and sit<>stand with up to +2 maxA. Emphasis on supine/seated RLE therapeutic exercises with good tolerance but ROM somewhat limited due to pain, benefits of mobility and safety with transfers given posterior hip precautions. Pt would benefit from OT evaluation as he is typically modI for ADLs and currently requiring up to +2 external physical assist to perform all functional tasks. Pt continues to benefit from PT services to progress toward functional mobility goals, continue to recommend short term post-acute rehab <3 hours/day.    If plan is discharge home, recommend the following: Two people to help with walking and/or transfers;Help with stairs or ramp for entrance;Assist for transportation;Assistance with cooking/housework   Can travel by private vehicle     No  Equipment Recommendations  Other (comment) (TBD)    Recommendations for Other Services       Precautions / Restrictions Precautions Precautions: Fall;Posterior Hip Precaution Booklet Issued: Yes (comment) Precaution Comments: recalls 2/3 today Restrictions Weight Bearing Restrictions: Yes RLE Weight Bearing: Partial  weight bearing RLE Partial Weight Bearing Percentage or Pounds: 50     Mobility  Bed Mobility Overal bed mobility: Needs Assistance Bed Mobility: Sit to Supine, Supine to Sit     Supine to sit: Used rails, HOB elevated, Max assist Sit to supine: Max assist, +2 for physical assistance   General bed mobility comments: hooklying to bridge with mod A and cues.  MaxA for truncal assist up and forward.  pt also with some difficulty scooting forward without angling his hips more than 90 deg. +2 assist for return to supine due to pain/fatigue.    Transfers Overall transfer level: Needs assistance Equipment used: Rolling walker (2 wheels) Transfers: Sit to/from Stand Sit to Stand: Max assist, +2 physical assistance, From elevated surface   Step pivot transfers: Max assist, From elevated surface       General transfer comment: elevated bed<>RW, pt took ~4 lateral steps toward [facility] wtih RW and +1-2 maxA (progressive posterior lean as pain/fatigue increased). Needs assist to weight shift laterally and shuffled steps only, unable to lift either foot fully off the floor. Mod cues for BUE and core activation to reduce pressure through RLE while stepping with LLE. Pt tolerates standing ~3 minutes total prior to fatigue. RN also performed peri-care while pt standing at EOB.    Ambulation/Gait                   Stairs             Wheelchair Mobility     Tilt Bed    Modified Rankin (Stroke Patients Only)       Balance Overall balance assessment: Needs assistance Sitting-balance  support: Feet supported, Bilateral upper extremity supported, Single extremity supported Sitting balance-Leahy Scale: Fair Sitting balance - Comments: some posterior bias initially, improves with anterior scooting. min guard to supervision with BUE support Postural control: Posterior lean Standing balance support: Bilateral upper extremity supported Standing balance-Leahy Scale: Poor Standing  balance comment: reliance on PTA and RW. +1 mod/maxA for static standing                            Cognition Arousal: Alert Behavior During Therapy: WFL for tasks assessed/performed Overall Cognitive Status: Within Functional Limits for tasks assessed                                 General Comments: pt oriented to self, situation, location. Pt recalls 2/3 posterior hip precs        Exercises Total Joint Exercises Ankle Circles/Pumps: AROM, Both, 10 reps, Supine Quad Sets: AROM, Supine, Both, 5 reps Heel Slides: AROM, AAROM, Both, Supine, 5 reps (AA on RLE) Hip ABduction/ADduction: AAROM, Right, 10 reps, Supine Long Arc Quad: AROM, Right, 5 reps, Seated Bridges: AROM, Left, 5 reps, Supine (limited hip clearance, x5 small bridges during bed mobility, pt keeping RLE straight/extended) Other Exercises Other Exercises: STS and static standing 3 mins for strengthening    General Comments General comments (skin integrity, edema, etc.): bed pad changed out while pt standing and NT performed standing peri-care.      Pertinent Vitals/Pain Pain Assessment Pain Assessment: Faces Faces Pain Scale: Hurts whole lot Pain Location: RLE (hip/knee) Pain Descriptors / Indicators: Sore, Discomfort, Grimacing, Operative site guarding, Moaning Pain Intervention(s): Limited activity within patient's tolerance, Monitored during session, Repositioned, Patient requesting pain meds-RN notified (NT to give him ice pack for hip after bed bath)    Home Living                          Prior Function            PT Goals (current goals can now be found in the care plan section) Acute Rehab PT Goals Patient Stated Goal: to return to PLOF PT Goal Formulation: With patient Time For Goal Achievement: 01/27/23 Progress towards PT goals: Progressing toward goals    Frequency    Min 1X/week      PT Plan      Co-evaluation              AM-PAC PT "6  Clicks" Mobility   Outcome Measure  Help needed turning from your back to your side while in a flat bed without using bedrails?: A Lot Help needed moving from lying on your back to sitting on the side of a flat bed without using bedrails?: A Lot Help needed moving to and from a bed to a chair (including a wheelchair)?: Total (+2 lift assist) Help needed standing up from a chair using your arms (e.g., wheelchair or bedside chair)?: Total (+2 lift assist) Help needed to walk in hospital room?: Total Help needed climbing 3-5 steps with a railing? : Total 6 Click Score: 8    End of Session Equipment Utilized During Treatment: Gait belt Activity Tolerance: Patient tolerated treatment well;Patient limited by pain Patient left: in bed;with call bell/phone within reach;with bed alarm set;with nursing/sitter in room;Other (comment) (NT providing hygiene and changing male purewick) Nurse Communication: Mobility status;Patient requests pain meds PT  Visit Diagnosis: Other abnormalities of gait and mobility (R26.89);Difficulty in walking, not elsewhere classified (R26.2);Pain;Muscle weakness (generalized) (M62.81) Pain - Right/Left: Right Pain - part of body: Hip     Time: 3244-0102 PT Time Calculation (min) (ACUTE ONLY): 21 min  Charges:    $Therapeutic Exercise: 8-22 mins PT General Charges $$ ACUTE PT VISIT: 1 Visit                     Matthew [name] P., PTA Acute Rehabilitation Services Secure Chat Preferred 9a-5:30pm Office: [phone]    Dorathy Kinsman [facility] 01/15/2023, 5:44 PM

## 2023-01-15 NOTE — Progress Notes (Signed)
PROGRESS NOTE FUTURE MONRROY  MWU:[id number] DOB: [birth year] DOA: 01/10/2023 PCP: Sherron Monday, MD  Brief Narrative/Hospital Course: 89 y.o.M W/ recent history of right hip fracture s/p intramedullary rod and femoral neck screw ESRD on HD MWF,A-fib, CAD, systolic CHF, type 2 diabetes, osteoporosis, HLD, PAF not on anticoagulation due to fall risk, chronic orthostatic hypotension on midodrine, CAD/history of CABG, HFmrEF, hypothyroidism, insomnia,hx of aortic dissection, presented with right hip pain and admitted to [facility] from 8/17 to 8/19 and transferred to [facility] for to be evaluated by Dr. Carola Frost for migration of the femoral nail for possible surgical revision. Seen by cardiology and preop evaluation: Echo shows a decrease in EF to 35-40% from previous 55.8% form 08/2021 8/21: s/p conversion THA with femoral neck fracture. Postop he is doing well  Subjective: Seen examined Aaox3 ordering his meal Overnight afebrile BP 90s to 109 stable had some drop during HD yesterday Labs reviewed this morning hemoglobin stable 8.5 g mild thrombocytopenia 136 Last BM 8/21  Assessment and Plan: Principal Problem:   Closed right hip fracture (HCC) Active Problems:   Closed hip fracture requiring operative repair, right, sequela   End-stage renal disease on hemodialysis (HCC)   Chronic hypotension   Atrial fibrillation (HCC)   CAD (coronary artery disease)   HFrEF (heart failure with reduced ejection fraction) (HCC)   Hypothyroidism   Chronic systolic HF (heart failure) (HCC)   Dissecting aortic aneurysm, thoracic (HCC)   Preop cardiovascular exam   Recent right hip fracture s/p intramedullary rod and femoral neck screw Right hip pain with migration of femoral nail: Transferred from [facility] to [facility] for surgical revision 8/21: s/p conversion THA with femoral neck fracture. Postop surgically stable appreciate input from orthopedic continue aspirin for DVT prophylaxis back on Plavix. Continue  follow-up in 2 weeks for surgery for wound check continue Aquacel until follow-up follow-up x-ray Cont PTOT and awaiting for placement  ESRD on HD MWF Anemia CKD MBD: Got hd 8/23 had hypotension during HD.  Continue to monitor hemoglobin, continue midodrine  Recent Labs    11/18/22 0543 11/19/22 0610 01/08/23 1358 01/10/23 0408 01/10/23 2214 01/11/23 0019 01/12/23 0137 01/12/23 1301 01/13/23 1235 01/14/23 0404 01/15/23 0316  BUN 39* 58* 65* 85* 46* 48* 63* 77* 79* 44* 38*  CREATININE 2.38* 2.98* 2.93* 3.76* 2.72* 2.72* 3.29* 3.50* 3.67* 2.59* 2.63*  CO2 30 28 28 28 28 29 27   --  23 25 28    Recent Labs  Lab 01/12/23 0137 01/12/23 1301 01/13/23 1235 01/14/23 0404 01/15/23 0316  HGB 11.2* 12.6* 10.1* 8.6* 8.5*  HCT 35.3* 37.0* 32.0* 26.6* 26.6*    TDM with hypoglycemia: Blood sugar stable.  Hold home glipizide, keep on SSI.  No more hypoglycemia Recent Labs  Lab 01/10/23 2214 01/10/23 2307 01/14/23 1616 01/14/23 2031 01/14/23 2308 01/15/23 0442 01/15/23 0813  GLUCAP  --    < > 191* 232* 191* 147* 125*  HGBA1C 6.0*  --   --   --   --   --   --    < > = values in this interval not displayed.   History of dissecting aortic aneurysm  Chronic hypotension: Continue midodrine, blood pressure remains soft  CAD status post CABG: Asymptomatic.Intolerant to statin. Cont his asa, plavix  HFrEF: echo from 08/2021 shows EF 55.8% now EF is low at 35 to 40% acute on chronic systolic dysfunction, euvolemic and being managed with dialysis-GDMT limited with hypotension and ESRD status-seen by cardiology.  PAF not on  anticoagulation due to fall risk: off sotalol due to bradycardia per cardiology.  Patient to follow-up with outpatient cardiologist -was seen by cardiology here   Hypothyroidism: Continue home Synthroid  Insomnia: On suvorexant , stable.  Recent COVID: complicating his rehab stay and had treatment with Paxlovid and went home on August  3  Hypoalbuminemia: RD consult augment diet  Pressure injury coccyx stage II POA see below Pressure Injury 01/10/23 Coccyx Mid;Lower Stage 2 -  Partial thickness loss of dermis presenting as a shallow open injury with a red, pink wound bed without slough. 2 small areas on sacrum (Active)  01/10/23 2100  Location: Coccyx  Location Orientation: Mid;Lower  Staging: Stage 2 -  Partial thickness loss of dermis presenting as a shallow open injury with a red, pink wound bed without slough.  Wound Description (Comments): 2 small areas on sacrum  Present on Admission: Yes  Dressing Type Foam - Lift dressing to assess site every shift 01/10/23 2200   DVT prophylaxis: SCDs Start: 01/10/23 2214 Code Status:   Code Status: DNR Family Communication: plan of care discussed with patient/daughter at bedside. Patient status is: Inpatient because of pain in rt hip needing surgical revision Level of care: Telemetry Medical   Dispo:The patient is from: home with daughter, independent with mobility            Anticipated disposition: SNF once available.  Medically stable  Objective: Vitals last 24 hrs: Vitals:   01/14/23 2046 01/14/23 2313 01/15/23 0443 01/15/23 0814  BP: (!) 88/38 (!) 92/58 (!) 102/50 (!) 109/49  Pulse: 82   64  Resp: 19  16 17   Temp: 97.6 F (36.4 C)  98.3 F (36.8 C) 97.8 F (36.6 C)  TempSrc: Oral  Oral Oral  SpO2: 100%  99% 99%  Weight:      Height:       Weight change:   Physical Examination: General exam: alert awake, oriented at baseline, older than stated age HEENT:Oral mucosa moist, Ear/Nose WNL grossly Respiratory system: Bilaterally clear BS,no use of accessory muscle Cardiovascular system: S1 & S2 +, No JVD. Gastrointestinal system: Abdomen soft,NT,ND, BS+ Nervous System: Alert, awake, moving all extremities,and following commands. Extremities: rt hip surgical site with dressing in place  w/ aquacel Skin: No rashes,no icterus. MSK: Normal muscle bulk,tone,  power   Medications reviewed:  Scheduled Meds:  Chlorhexidine Gluconate Cloth  6 each Topical Q0600   clopidogrel  75 mg Oral Daily   insulin aspart  0-9 Units Subcutaneous Q4H   levothyroxine  88 mcg Oral Q0600   midodrine  10 mg Oral Q M,W,F-HD   midodrine  5 mg Oral TID WC   multivitamin  1 tablet Oral QHS   Ensure Max Protein  11 oz Oral Daily   zolpidem  5 mg Oral QHS   Continuous Infusions:  methocarbamol (ROBAXIN) IV     Diet Order             Diet regular Room service appropriate? Yes; Fluid consistency: Thin  Diet effective now                  Intake/Output Summary (Last 24 hours) at 01/15/2023 1008 Last data filed at 01/15/2023 0616 Gross per 24 hour  Intake 950.83 ml  Output 2700 ml  Net -1749.17 ml   Net IO Since Admission: -2,439.17 mL [01/15/23 1008]  Wt Readings from Last 3 Encounters:  01/14/23 90.9 kg  01/10/23 91 kg  11/19/22 99.9 kg  Unresulted Labs (From admission, onward)     Start     Ordered   01/12/23 1119  Surgical pcr screen  Once,   R        01/12/23 1118   01/12/23 0500  Basic metabolic panel  Daily,   R      01/11/23 0843   01/12/23 0500  CBC  Daily,   R      01/11/23 0843          Data Reviewed: I have personally reviewed following labs and imaging studies CBC: Recent Labs  Lab 01/08/23 1358 01/10/23 0929 01/11/23 0019 01/12/23 0137 01/12/23 1301 01/13/23 1235 01/14/23 0404 01/15/23 0316  WBC 6.0   < > 5.1 5.2  --  8.6 10.1 10.4  NEUTROABS 4.2  --   --   --   --   --   --   --   HGB 11.2*   < > 11.2* 11.2* 12.6* 10.1* 8.6* 8.5*  HCT 35.5*   < > 34.6* 35.3* 37.0* 32.0* 26.6* 26.6*  MCV 99.7   < > 99.7 99.4  --  100.3* 95.7 98.9  PLT 131*   < > 129* 140*  --  141* 148* 136*   < > = values in this interval not displayed.   Basic Metabolic Panel: Recent Labs  Lab 01/10/23 2214 01/11/23 0019 01/12/23 0137 01/12/23 1301 01/13/23 1235 01/14/23 0404 01/15/23 0316  NA 134* 133* 132* 134* 134* 131* 134*  K  4.4 4.7 4.6 5.0 5.1 3.9 4.3  CL 92* 94* 92* 96* 98 93* 93*  CO2 28 29 27   --  23 25 28   GLUCOSE 123* 131* 151* 108* 217* 200* 144*  BUN 46* 48* 63* 77* 79* 44* 38*  CREATININE 2.72* 2.72* 3.29* 3.50* 3.67* 2.59* 2.63*  CALCIUM 8.8* 8.7* 8.7*  --  8.6* 8.2* 8.4*  MG 1.9  --   --   --   --   --   --   PHOS 3.4  --   --   --   --   --   --   GFR: Estimated Creatinine Clearance: 21.1 mL/min (A) (by C-G formula based on SCr of 2.63 mg/dL (H)). Liver Function Tests: Recent Labs  Lab 01/10/23 2214 01/11/23 0019  AST 27 25  ALT 24 24  ALKPHOS 236* 250*  BILITOT 0.4 0.7  PROT 6.5 6.6  ALBUMIN 2.7* 2.7*   No results for input(s): "HGBA1C" in the last 72 hours.  CBG: Recent Labs  Lab 01/14/23 1616 01/14/23 2031 01/14/23 2308 01/15/23 0442 01/15/23 0813  GLUCAP 191* 232* 191* 147* 125*  Lipid Profile: No results for input(s): "CHOL", "HDL", "LDLCALC", "TRIG", "CHOLHDL", "LDLDIRECT" in the last 72 hours. Thyroid Function Tests: No results for input(s): "TSH", "T4TOTAL", "FREET4", "T3FREE", "THYROIDAB" in the last 72 hours.  Sepsis Labs: No results for input(s): "PROCALCITON", "LATICACIDVEN" in the last 168 hours.  Recent Results (from the past 240 hour(s))  Surgical pcr screen     Status: None   Collection Time: 01/11/23  5:10 AM   Specimen: Nasal Mucosa; Nasal Swab  Result Value Ref Range Status   MRSA, PCR NEGATIVE NEGATIVE Final   Staphylococcus aureus NEGATIVE NEGATIVE Final    Comment: (NOTE) The Xpert SA Assay (FDA approved for NASAL specimens in patients 63 years of age and older), is one component of a comprehensive surveillance program. It is not intended to diagnose infection nor to guide or monitor treatment. Performed at [facility], [address] [place], [place] [address]     Antimicrobials: Anti-infectives (From admission, onward)    Start     Dose/Rate Route Frequency Ordered Stop   01/14/23 0000  cefadroxil (DURICEF) 500 MG capsule  Status:   Discontinued        500 mg Oral 2 times daily 01/14/23 0645 01/14/23    01/14/23 0000  cefadroxil (DURICEF) 500 MG capsule        500 mg Oral 2 times daily 01/14/23 0646 01/21/23 2359   01/13/23 0300  ceFAZolin (ANCEF) IVPB 2g/100 mL premix        2 g 200 mL/hr over 30 Minutes Intravenous Every 12 hours 01/12/23 2151 01/14/23 2159   01/12/23 1400  ceFAZolin (ANCEF) IVPB 2g/100 mL premix        2 g 200 mL/hr over 30 Minutes Intravenous  Once 01/12/23 1353 01/12/23 1558      Culture/Microbiology    Component Value Date/Time   SDES BLOOD BLOOD LEFT ARM 05/24/2019 0021   SDES BLOOD BLOOD LEFT HAND 05/24/2019 0021   SDES  05/24/2019 0021    IN/OUT CATH URINE Performed at [facility], [address] [place] [place], [place] [address]    SPECREQUEST  05/24/2019 0021    BOTTLES DRAWN AEROBIC AND ANAEROBIC Blood Culture adequate volume   SPECREQUEST  05/24/2019 0021    BOTTLES DRAWN AEROBIC AND ANAEROBIC Blood Culture adequate volume   SPECREQUEST  05/24/2019 0021    NONE Performed at [facility], [address] [place] [place], [place] [address]    CULT  05/24/2019 0021    NO GROWTH 5 DAYS Performed at [facility], [address] [place] [place], [place] [address]    CULT  05/24/2019 0021    NO GROWTH 5 DAYS Performed at [facility], [address] [place] [place], [place] [address]    CULT  05/24/2019 0021    NO GROWTH Performed at [facility], [address], [place], [place] [address]    REPTSTATUS 05/29/2019 FINAL 05/24/2019 0021   REPTSTATUS 05/29/2019 FINAL 05/24/2019 0021   REPTSTATUS 05/25/2019 FINAL 05/24/2019 0021    Radiology Studies: No results found.   LOS: 5 days   Lanae Boast, MD [facility]  01/15/2023, 10:08 AM

## 2023-01-16 DIAGNOSIS — S72001A Fracture of unspecified part of neck of right femur, initial encounter for closed fracture: Secondary | ICD-10-CM | POA: Diagnosis not present

## 2023-01-16 LAB — BASIC METABOLIC PANEL
Anion gap: 9 (ref 5–15)
BUN: 65 mg/dL — ABNORMAL HIGH (ref 8–23)
CO2: 28 mmol/L (ref 22–32)
Calcium: 8.3 mg/dL — ABNORMAL LOW (ref 8.9–10.3)
Chloride: 93 mmol/L — ABNORMAL LOW (ref 98–111)
Creatinine, Ser: 3.81 mg/dL — ABNORMAL HIGH (ref 0.61–1.24)
GFR, Estimated: 14 mL/min — ABNORMAL LOW (ref >60)
Glucose, Bld: 162 mg/dL — ABNORMAL HIGH (ref 70–99)
Potassium: 4.1 mmol/L (ref 3.5–5.1)
Sodium: 130 mmol/L — ABNORMAL LOW (ref 135–145)

## 2023-01-16 LAB — GLUCOSE, CAPILLARY
Glucose-Capillary: 167 mg/dL — ABNORMAL HIGH (ref 70–99)
Glucose-Capillary: 135 mg/dL — ABNORMAL HIGH (ref 70–99)
Glucose-Capillary: 212 mg/dL — ABNORMAL HIGH (ref 70–99)
Glucose-Capillary: 170 mg/dL — ABNORMAL HIGH (ref 70–99)
Glucose-Capillary: 142 mg/dL — ABNORMAL HIGH (ref 70–99)

## 2023-01-16 LAB — CBC
HCT: 27.1 % — ABNORMAL LOW (ref 39.0–52.0)
Hemoglobin: 8.6 g/dL — ABNORMAL LOW (ref 13.0–17.0)
MCH: 31 pg (ref 26.0–34.0)
MCHC: 31.7 g/dL (ref 30.0–36.0)
MCV: 97.8 fL (ref 80.0–100.0)
Platelets: 148 10*3/uL — ABNORMAL LOW (ref 150–400)
RBC: 2.77 MIL/uL — ABNORMAL LOW (ref 4.22–5.81)
RDW: 15.9 % — ABNORMAL HIGH (ref 11.5–15.5)
WBC: 9 10*3/uL (ref 4.0–10.5)
nRBC: 0.3 % — ABNORMAL HIGH (ref 0.0–0.2)

## 2023-01-16 MED ORDER — INSULIN ASPART 100 UNIT/ML IJ SOLN
0.0000 [IU] | Freq: Three times a day (TID) | INTRAMUSCULAR | Status: DC
  Administered 2023-01-16: 2 [IU] via SUBCUTANEOUS
  Administered 2023-01-16: 1 [IU] via SUBCUTANEOUS
  Administered 2023-01-17: 2 [IU] via SUBCUTANEOUS

## 2023-01-16 MED ORDER — CHLORHEXIDINE GLUCONATE CLOTH 2 % EX PADS
6.0000 | MEDICATED_PAD | Freq: Every day | CUTANEOUS | Status: DC
  Administered 2023-01-16 – 2023-01-17 (×2): 6 via TOPICAL

## 2023-01-16 NOTE — Progress Notes (Signed)
[facility] Progress Note  Subjective: pt seen in room. Stable today, no c/o's.   Vitals:   01/15/23 1654 01/15/23 1932 01/16/23 0405 01/16/23 0757  BP: (!) 105/51 (!) 93/45 (!) 103/56 (!) 107/54  Pulse: 72 84 96 87  Resp: 18 17 17 18   Temp: 97.8 F (36.6 C) 97.8 F (36.6 C) 97.8 F (36.6 C) 98 F (36.7 C)  TempSrc: Oral Oral Oral Oral  SpO2: 99% 100% 100% 99%  Weight:      Height:        Exam: Gen alert, no distress, elderly WM No jvd or bruits Chest clear bilat to bases RRR no RG Abd soft ntnd no mass or ascites +bs Ext no LE edema Neuro is alert, Ox 3 , nf    LUE AVF+bruit       Home meds include - plavix, lasix 40, glipizide, renvavite, klorcon 40, crestor, synthroid, midodrine 5 tid, sotalol 80 bid, suvorexant, prns        OP HD: MWF [facility]   3.5h  92.5kg   400/ 500   2/2.5 bath   15ga   LUA AVF  Heparin 2000u then 600/hr     Assessment/ Plan: R hip pain - secondary to femoral nail migration. SP THR per ortho on 8/21.  ESRD - on HD MWF. HD tomorrow.  Hypotension - chronic, on midodrine 5 tid, continue Volume - no sig vol excess on exam. Just below dry wt. Stable on exam.  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  MBD ckd - CCa is in range. Not on any binders, phos 3.4. Follow.  Atrial fib - on BB HFrEF H/o CABG x 4    Rob Javier Mamone MD  [facility] 01/16/2023, 9:52 AM  Recent Labs  Lab 01/10/23 2214 01/11/23 0019 01/12/23 0137 01/15/23 0316 01/16/23 0327  HGB  --  11.2*   < > 8.5* 8.6*  ALBUMIN 2.7* 2.7*  --   --   --   CALCIUM 8.8* 8.7*   < > 8.4* 8.3*  PHOS 3.4  --   --   --   --   CREATININE 2.72* 2.72*   < > 2.63* 3.81*  K 4.4 4.7   < > 4.3 4.1   < > = values in this interval not displayed.   Recent Labs  Lab 01/10/23 2216  IRON 29*  TIBC 255  FERRITIN 920*   Inpatient medications:  Chlorhexidine Gluconate Cloth  6 each Topical Q0600   clopidogrel  75 mg Oral Daily   insulin aspart  0-9 Units Subcutaneous Q4H    levothyroxine  88 mcg Oral Q0600   midodrine  10 mg Oral Q M,W,F-HD   midodrine  5 mg Oral TID WC   multivitamin  1 tablet Oral QHS   Ensure Max Protein  11 oz Oral Daily   zolpidem  5 mg Oral QHS    methocarbamol (ROBAXIN) IV     HYDROcodone-acetaminophen, HYDROmorphone (DILAUDID) injection, ketorolac, methocarbamol **OR** methocarbamol (ROBAXIN) IV, morphine injection, prochlorperazine

## 2023-01-16 NOTE — Discharge Summary (Signed)
Physician Discharge Summary  Matthew [name] ZOX:[id number] DOB: 10-Apr-1931 DOA: 01/10/2023  PCP: Sherron Monday, MD  Admit date: 01/10/2023 Discharge date: 01/17/2023 Recommendations for Outpatient Follow-up:  Follow up with PCP in 1 weeks-call for appointment Please obtain BMP/CBC in one week Follow-up with orthopedics in 2 weeks postdischarge  Discharge Dispo: SNF Discharge Condition: Stable Code Status:   Code Status: DNR Diet recommendation:  Diet Order             Diet - low sodium heart healthy           Diet regular Room service appropriate? Yes; Fluid consistency: Thin  Diet effective now                    Brief/Interim Summary: 89 y.o.M W/ recent history of right hip fracture s/p intramedullary rod and femoral neck screw ESRD on HD MWF,A-fib, CAD, systolic CHF, type 2 diabetes, osteoporosis, HLD, PAF not on anticoagulation due to fall risk, chronic orthostatic hypotension on midodrine, CAD/history of CABG, HFmrEF, hypothyroidism, insomnia,hx of aortic dissection, presented with right hip pain and admitted to [facility] from 8/17 to 8/19 and transferred to [facility] for to be evaluated by Dr. Carola Frost for migration of the femoral nail for possible surgical revision. Seen by cardiology and preop evaluation: Echo shows a decrease in EF to 35-40% from previous 55.8% form 08/2021 8/21: s/p conversion THA with femoral neck fracture. Postop he is doing well> waiting for skilled nursing facility.  He remains medically stable for discharge and facility can accept him on 01/17/23 He will be discharged after dialysis to skilled nursing facility bed available today   Discharge Diagnoses:  Principal Problem:   Closed right hip fracture [facility]) Active Problems:   Closed hip fracture requiring operative repair, right, sequela   End-stage renal disease on hemodialysis (HCC)   Chronic hypotension   Atrial fibrillation (HCC)   CAD (coronary artery disease)   HFrEF (heart failure with reduced  ejection fraction) (HCC)   Hypothyroidism   Chronic systolic HF (heart failure) (HCC)   Dissecting aortic aneurysm, thoracic (HCC)   Preop cardiovascular exam  Recent right hip fracture s/p intramedullary rod and femoral neck screw Right hip pain with migration of femoral nail: Transferred from [facility] to [facility] for surgical revision 8/21: s/p conversion THA with femoral neck fracture. Postop surgically stable appreciate input from orthopedic continue aspirin for DVT prophylaxis back on Plavix. Continue follow-up in 2 weeks for surgery for wound check continue Aquacel until follow-up follow-up x-ray Cont PTOT. Awaiting SNF  ESRD on HD MWF Anemia CKD MBD: Got HD 8/23 had hypotension during HD.Continue to monitor hemoglobin, transfuse if less than 7 g.Continue midodrine  Recent Labs    11/19/22 0610 01/08/23 1358 01/10/23 0408 01/10/23 2214 01/11/23 0019 01/12/23 0137 01/12/23 1301 01/13/23 1235 01/14/23 0404 01/15/23 0316 01/16/23 0327  BUN 58* 65* 85* 46* 48* 63* 77* 79* 44* 38* 65*  CREATININE 2.98* 2.93* 3.76* 2.72* 2.72* 3.29* 3.50* 3.67* 2.59* 2.63* 3.81*  CO2 28 28 28 28 29 27   --  23 25 28 28    Recent Labs  Lab 01/12/23 1301 01/13/23 1235 01/14/23 0404 01/15/23 0316 01/16/23 0327  HGB 12.6* 10.1* 8.6* 8.5* 8.6*  HCT 37.0* 32.0* 26.6* 26.6* 27.1*    TDM with hypoglycemia: Blood sugar stable.  Resume home meds upon discharge  Recent Labs  Lab 01/10/23 2214 01/10/23 2307 01/16/23 0756 01/16/23 1159 01/16/23 1635 01/16/23 2104 01/17/23 0745  GLUCAP  --    < >  135* 212* 170* 142* 198*  HGBA1C 6.0*  --   --   --   --   --   --    < > = values in this interval not displayed.   History of dissecting aortic aneurysm  Chronic hypotension: Continue midodrine, blood pressure remains soft  CAD status post CABG: Asymptomatic.Intolerant to statin. Cont his asa, plavix  HFrEF: echo from 08/2021 shows EF 55.8% now EF is low at 35 to 40% acute on chronic systolic  dysfunction, euvolemic and being managed with dialysis-GDMT limited with hypotension and ESRD status-seen by cardiology and advised to follow-up with outpatient cardiology.  PAF not on anticoagulation due to fall risk: off sotalol due to bradycardia per cardiology.  Patient to follow-up with outpatient cardiologist -was seen by cardiology here   Hypothyroidism: Continue home Synthroid  Insomnia: On suvorexant , stable.  Recent COVID: complicating his rehab stay and had treatment with Paxlovid and went home on August 3  Hypoalbuminemia: RD consult augment diet  Pressure injury coccyx stage II POA see below Pressure Injury 01/10/23 Coccyx Mid;Lower Stage 2 -  Partial thickness loss of dermis presenting as a shallow open injury with a red, pink wound bed without slough. 2 small areas on sacrum (Active)  01/10/23 2100  Location: Coccyx  Location Orientation: Mid;Lower  Staging: Stage 2 -  Partial thickness loss of dermis presenting as a shallow open injury with a red, pink wound bed without slough.  Wound Description (Comments): 2 small areas on sacrum  Present on Admission: Yes  Dressing Type Foam - Lift dressing to assess site every shift 01/10/23 2200   Consults: Orthopedics, cardiology, nephrology Subjective: Patient is alert awake oriented resting comfortably agreeable for discharge after dialysis today  Discharge Exam: Vitals:   01/17/23 0518 01/17/23 0742  BP: (!) 153/75 127/72  Pulse: 87 82  Resp: 20 18  Temp: 98.2 F (36.8 C) 98.1 F (36.7 C)  SpO2: 95% 98%   General: Pt is alert, awake, not in acute distress Cardiovascular: RRR, S1/S2 +, no rubs, no gallops Respiratory: CTA bilaterally, no wheezing, no rhonchi Abdominal: Soft, NT, ND, bowel sounds + Extremities: no edema, no cyanosis  Discharge Instructions  Discharge Instructions     Diet - low sodium heart healthy   Complete by: As directed    Discharge instructions   Complete by: As directed     Follow-up with orthopedics 2 weeks post surgery  Please call call MD or return to ER for similar or worsening recurring problem that brought you to hospital or if any fever,nausea/vomiting,abdominal pain, uncontrolled pain, chest pain,  shortness of breath or any other alarming symptoms.  Please follow-up your doctor as instructed in a week time and call the office for appointment.  Please avoid alcohol, smoking, or any other illicit substance and maintain healthy habits including taking your regular medications as prescribed.  You were cared for by a hospitalist during your hospital stay. If you have any questions about your discharge medications or the care you received while you were in the hospital after you are discharged, you can call the unit and ask to speak with the hospitalist on call if the hospitalist that took care of you is not available.  Once you are discharged, your primary care physician will handle any further medical issues. Please note that NO REFILLS for any discharge medications will be authorized once you are discharged, as it is imperative that you return to your primary care physician (or establish a  relationship with a primary care physician if you do not have one) for your aftercare needs so that they can reassess your need for medications and monitor your lab values   Discharge wound care:   Complete by: As directed    Keep Aquacel dressing and reinforce until follow-up   Increase activity slowly   Complete by: As directed       Allergies as of 01/17/2023       Reactions   Other    Other reaction(s): Myalgias (intolerance), Other (See Comments) Statin Drugs  Statin Drugs    Amoxicillin Diarrhea        Medication List     STOP taking these medications    furosemide 40 MG tablet Commonly known as: LASIX   glipiZIDE 5 MG tablet Commonly known as: GLUCOTROL   potassium chloride SA 20 MEQ tablet Commonly known as: KLOR-CON M   sotalol 80 MG  tablet Commonly known as: Betapace       TAKE these medications    Belsomra 10 MG Tabs Generic drug: Suvorexant Take 10 mg by mouth at bedtime.   cefadroxil 500 MG capsule Commonly known as: DURICEF Take 1 capsule (500 mg total) by mouth 2 (two) times daily for 7 days.   cetirizine 10 MG tablet Commonly known as: ZYRTEC Take 10 mg by mouth daily.   cholecalciferol 25 MCG (1000 UNIT) tablet Commonly known as: VITAMIN D3 Take 1,000 Units by mouth daily with breakfast.   clopidogrel 75 MG tablet Commonly known as: PLAVIX Take 1 tablet (75 mg total) by mouth daily.   ferrous sulfate 325 (65 FE) MG tablet Take 325 mg by mouth 2 (two) times daily with a meal.   Fish Oil 1000 MG Caps Take 4,000 mg by mouth daily with lunch.   fluticasone 50 MCG/ACT nasal spray Commonly known as: FLONASE Place 2 sprays into both nostrils daily as needed for allergies or rhinitis.   HYDROcodone-acetaminophen 5-325 MG tablet Commonly known as: NORCO/VICODIN Take 1 tablet by mouth every 4 (four) hours as needed for up to 7 days for moderate pain.   levothyroxine 88 MCG tablet Commonly known as: SYNTHROID Take 88 mcg by mouth daily before breakfast.   lidocaine-prilocaine cream Commonly known as: EMLA Apply 1 Application topically as needed (before dialysis).   midodrine 5 MG tablet Commonly known as: PROAMATINE Take 1 tablet (5 mg total) by mouth 3 (three) times daily with meals. What changed:  when to take this additional instructions   multivitamin Tabs tablet Take 1 tablet by mouth daily.   nitroGLYCERIN 0.4 MG SL tablet Commonly known as: NITROSTAT Place 0.4 mg under the tongue every 5 (five) minutes as needed for chest pain.   polyethylene glycol 17 g packet Commonly known as: MIRALAX / GLYCOLAX Take 17 g by mouth daily.   rosuvastatin 40 MG tablet Commonly known as: CRESTOR TAKE 1 TABLET BY MOUTH ONCE  DAILY   senna-docusate 8.6-50 MG tablet Commonly known as:  Senokot-S Take 2 tablets by mouth 2 (two) times daily.               Discharge Care Instructions  (From admission, onward)           Start     Ordered   01/17/23 0000  Discharge wound care:       Comments: Keep Aquacel dressing and reinforce until follow-up   01/17/23 0931            Follow-up Information  Joen Laura, MD Follow up in 2 week(s).   Specialty: Orthopedic Surgery Contact information: [address] [place] [address] [phone]                Allergies  Allergen Reactions   Other     Other reaction(s): Myalgias (intolerance), Other (See Comments) Statin Drugs  Statin Drugs     Amoxicillin Diarrhea    The results of significant diagnostics from this hospitalization (including imaging, microbiology, ancillary and laboratory) are listed below for reference.    Microbiology: Recent Results (from the past 240 hour(s))  Surgical pcr screen     Status: None   Collection Time: 01/11/23  5:10 AM   Specimen: Nasal Mucosa; Nasal Swab  Result Value Ref Range Status   MRSA, PCR NEGATIVE NEGATIVE Final   Staphylococcus aureus NEGATIVE NEGATIVE Final    Comment: (NOTE) The Xpert SA Assay (FDA approved for NASAL specimens in patients 65 years of age and older), is one component of a comprehensive surveillance program. It is not intended to diagnose infection nor to guide or monitor treatment. Performed at [facility], [address] [place], [place] [address]     Procedures/Studies: [place] HIP UNILAT W OR W/O PELVIS 2-3 VIEWS RIGHT  Result Date: 01/12/2023 CLINICAL DATA:  Postop. EXAM: DG HIP (WITH OR WITHOUT PELVIS) 2-3V RIGHT COMPARISON:  Preoperative imaging. FINDINGS: Removal of femoral intramedullary nail and trans trochanteric screw fixation. Interval right hip arthroplasty in expected alignment. Cerclage wire about the femoral stem. Chronic displacement of the lesser trochanter. The bones are subjectively  under mineralized. Remote pubic rami fractures. Recent postsurgical change includes air and edema in the soft tissues. IMPRESSION: Interval right hip arthroplasty without immediate postoperative complication. Electronically Signed   By: Narda Rutherford M.D.   On: 01/12/2023 19:53   DG HIP UNILAT WITH PELVIS 1V RIGHT  Result Date: 01/12/2023 CLINICAL DATA:  Elective surgery. EXAM: DG HIP (WITH OR WITHOUT PELVIS) 1V RIGHT COMPARISON:  Preoperative imaging FINDINGS: Single fluoroscopic spot view of the right hip obtained in the operating room. Image obtained during hip arthroplasty. Fluoroscopy time 10 seconds. Dose 4.91 mGy. IMPRESSION: Intraoperative fluoroscopy during right hip arthroplasty. Electronically Signed   By: Narda Rutherford M.D.   On: 01/12/2023 18:26   DG C-Arm 1-60 Min-No Report  Result Date: 01/12/2023 Fluoroscopy was utilized by the requesting physician.  No radiographic interpretation.   DG C-Arm 1-60 Min-No Report  Result Date: 01/12/2023 Fluoroscopy was utilized by the requesting physician.  No radiographic interpretation.   ECHOCARDIOGRAM COMPLETE  Result Date: 01/12/2023    ECHOCARDIOGRAM REPORT   Patient Name:   Matthew [name] Date of Exam: 01/11/2023 Medical Rec #:  [id number]       Height:       73.0 in Accession #:    [id number]      Weight:       216.5 lb Date of Birth:  01/23/1931        BSA:          2.225 m Patient Age:    89 years        BP:           102/45 mmHg Patient Gender: M               HR:           60 bpm. Exam Location:  Inpatient Procedure: 2D Echo, Cardiac Doppler and Color Doppler Indications:  Abnormal ECG R94.31  History:        Patient has prior history of Echocardiogram examinations, most                 recent 01/11/2018. Cardiomyopathy and CHF, CAD, Angina and                 Previous Myocardial Infarction, Prior CABG, Arrythmias:Atrial                 Fibrillation, Signs/Symptoms:Hypotension and Shortness of                 Breath; Risk  Factors:Diabetes and Dyslipidemia. ESRD.  Sonographer:    [name] Referring Phys: [id number] ANASTASSIA DOUTOVA IMPRESSIONS  1. Left ventricular ejection fraction, by estimation, is 35 to 40%. The left ventricle has moderately decreased function. The left ventricle demonstrates global hypokinesis. There is mild concentric left ventricular hypertrophy. Left ventricular diastolic parameters are indeterminate. Elevated left ventricular end-diastolic pressure.  2. Right ventricular systolic function is mildly reduced. The right ventricular size is severely enlarged. There is moderately elevated pulmonary artery systolic pressure. The estimated right ventricular systolic pressure is 53.9 mmHg.  3. The mitral valve is degenerative. Mild mitral valve regurgitation. No evidence of mitral stenosis.  4. The aortic valve is tricuspid. Aortic valve regurgitation is mild to moderate. Aortic valve sclerosis/calcification is present, without any evidence of aortic stenosis. Aortic valve area, by VTI measures 1.89 cm. Aortic valve mean gradient measures 5.0 mmHg. Aortic valve Vmax measures 1.34 m/s.  5. Aortic dilatation noted. 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 There is mild dilatation of the ascending aorta, measuring 39 mm.  6. The inferior vena cava is dilated in size with <50% respiratory variability, suggesting right atrial pressure of 15 mmHg.  7. Recommend CHest CTA to assess ascending aortic aneurysm. FINDINGS  Left Ventricle: Left ventricular ejection fraction, by estimation, is 35 to 40%. The left ventricle has moderately decreased function. The left ventricle demonstrates global hypokinesis. The left ventricular internal cavity size was normal in size. There is mild concentric left ventricular hypertrophy. Left ventricular diastolic parameters are indeterminate. Elevated left ventricular end-diastolic pressure. Right Ventricle: The right ventricular size is severely enlarged. No increase in right  ventricular wall thickness. Right ventricular systolic function is mildly reduced. There is moderately elevated pulmonary artery systolic pressure. The tricuspid regurgitant velocity is 3.12 m/s, and with an assumed right atrial pressure of 15 mmHg, the estimated right ventricular systolic pressure is 53.9 mmHg. Left Atrium: Left atrial size was normal in size. Right Atrium: Right atrial size was normal in size. Pericardium: There is no evidence of pericardial effusion. Mitral Valve: The mitral valve is degenerative in appearance. There is mild thickening of the mitral valve leaflet(s). There is mild calcification of the mitral valve leaflet(s). Mild to moderate mitral annular calcification. Mild mitral valve regurgitation. No evidence of mitral valve stenosis. Tricuspid Valve: The tricuspid valve is normal in structure. Tricuspid valve regurgitation is mild . No evidence of tricuspid stenosis. Aortic Valve: The aortic valve is tricuspid. Aortic valve regurgitation is mild to moderate. Aortic regurgitation PHT measures 550 msec. Aortic valve sclerosis/calcification is present, without any evidence of aortic stenosis. Aortic valve mean gradient measures 5.0 mmHg. Aortic valve peak gradient measures 7.2 mmHg. Aortic valve area, by VTI measures 1.89 cm. Pulmonic Valve: The pulmonic valve was normal in structure. Pulmonic valve regurgitation is mild. No evidence of pulmonic stenosis. Aorta: Aortic dilatation noted. There is mild dilatation of the  ascending aorta, measuring 39 mm. There is an aneurysm involving the ascending aorta measuring 65 mm. Venous: The inferior vena cava is dilated in size with less than 50% respiratory variability, suggesting right atrial pressure of 15 mmHg. IAS/Shunts: No atrial level shunt detected by color flow Doppler.  LEFT VENTRICLE PLAX 2D LVIDd:         5.30 cm   Diastology LVIDs:         4.40 cm   LV e' medial:    5.25 cm/s LV PW:         1.20 cm   LV E/e' medial:  19.8 LV IVS:         1.20 cm   LV e' lateral:   9.75 cm/s LVOT diam:     2.10 cm   LV E/e' lateral: 10.7 LV SV:         67 LV SV Index:   30 LVOT Area:     3.46 cm  RIGHT VENTRICLE             IVC RV S prime:     11.20 cm/s  IVC diam: 2.40 cm TAPSE (M-mode): 1.2 cm LEFT ATRIUM             Index        RIGHT ATRIUM           Index LA diam:        4.70 cm 2.11 cm/m   RA Area:     24.80 cm LA Vol (A2C):   71.4 ml 32.09 ml/m  RA Volume:   69.90 ml  31.41 ml/m LA Vol (A4C):   70.9 ml 31.86 ml/m LA Biplane Vol: 73.6 ml 33.07 ml/m  AORTIC VALVE                    PULMONIC VALVE AV Area (Vmax):    2.26 cm     PR End Diast Vel: 3.33 msec AV Area (Vmean):   1.97 cm AV Area (VTI):     1.89 cm AV Vmax:           134.50 cm/s AV Vmean:          99.800 cm/s AV VTI:            0.353 m AV Peak Grad:      7.2 mmHg AV Mean Grad:      5.0 mmHg LVOT Vmax:         87.68 cm/s LVOT Vmean:        56.625 cm/s LVOT VTI:          0.192 m LVOT/AV VTI ratio: 0.55 AI PHT:            550 msec  AORTA Ao Root diam: 3.90 cm Ao Asc diam:  6.10 cm MITRAL VALVE                TRICUSPID VALVE MV Area (PHT): 4.39 cm     TR Peak grad:   38.9 mmHg MV Decel Time: 173 msec     TR Vmax:        312.00 cm/s MR Peak grad: 69.2 mmHg MR Vmax:      416.00 cm/s   SHUNTS MV E velocity: 104.00 cm/s  Systemic VTI:  0.19 m MV A velocity: 56.60 cm/s   Systemic Diam: 2.10 cm MV E/A ratio:  1.84 Armanda Magic MD Electronically signed by Armanda Magic MD Signature Date/Time: 01/12/2023/8:09:45 AM    Final  DG CHEST PORT 1 VIEW  Result Date: 01/10/2023 CLINICAL DATA:  Preop cardiovascular exam EXAM: PORTABLE CHEST 1 VIEW COMPARISON:  11/12/2022 FINDINGS: Prior CABG. Cardiomegaly. Aortic atherosclerosis. Vascular congestion. No overt edema. Linear atelectasis in the left mid lung. No visible effusions. No acute bony abnormality. IMPRESSION: Cardiomegaly, vascular congestion. Left mid lung subsegmental atelectasis. Electronically Signed   By: Charlett Nose M.D.   On: 01/10/2023 22:55    CT HIP RIGHT WO CONTRAST  Result Date: 01/10/2023 CLINICAL DATA:  Hip replacement, loosening suspected Hip surgical planning EXAM: CT OF THE RIGHT HIP WITHOUT CONTRAST TECHNIQUE: Multidetector CT imaging of the right hip was performed according to the standard protocol. Multiplanar CT image reconstructions were also generated. RADIATION DOSE REDUCTION: This exam was performed according to the departmental dose-optimization program which includes automated exposure control, adjustment of the mA and/or kV according to patient size and/or use of iterative reconstruction technique. COMPARISON:  X-ray 01/08/2023, 11/12/2022 FINDINGS: Bones/Joint/Cartilage Postsurgical changes from right hip ORIF associated with a intertrochanteric fracture of the proximal right femur. Fracture lines remain visible. There is mild periosteal new bone formation at the fracture location. Femoral neck lag screw has migrated within the femoral head and the distal tip is now positioned intra-articularly extending 2 mm beyond the femoral head cortex. No definite new fracture. Hip joint alignment is maintained without dislocation. Mild to moderate right hip joint osteoarthritis. Old healed fractures of the right superior and inferior pubic rami. Ligaments Suboptimally assessed by CT. Muscles and Tendons No acute musculotendinous abnormality by CT. Soft tissues Nonspecific soft tissue swelling/edema over the lateral aspect of the right hip and proximal thigh. No organized fluid collections. Atherosclerotic vascular calcification. No right inguinal lymphadenopathy. IMPRESSION: 1. Postsurgical changes from right hip ORIF associated with a intertrochanteric fracture of the proximal right femur. Fracture lines remain visible. There is mild periosteal new bone formation at the fracture location. 2. Femoral neck lag screw has migrated within the femoral head and the distal tip is now positioned intra-articularly. 3. Mild to moderate right hip  joint osteoarthritis. 4. Old healed fractures of the right superior and inferior pubic rami. 5. Nonspecific soft tissue swelling/edema over the lateral aspect of the right hip and proximal thigh. No organized fluid collections. Electronically Signed   By: Duanne Guess D.O.   On: 01/10/2023 11:44   DG Femur Portable Min 2 Views Right  Result Date: 01/08/2023 CLINICAL DATA:  Right hip surgery in June with worsening pain in the right hip over the past week. EXAM: RIGHT FEMUR PORTABLE 2 VIEW COMPARISON:  Right hip radiographs dated 11/12/2022. FINDINGS: The patient is status post placement of a femoral shaft intramedullary nail and femoral neck screw on the right. Compared to 11/12/2022, the femoral neck screw has advanced with the tip now extending slightly beyond the right femoral head and into the right hip joint space. No new fracture is identified, however there is suggestion of compression of the intertrochanteric fracture of the right femur compared to prior exam. A displaced fracture fragment of the right lesser trochanter is redemonstrated. IMPRESSION: Suggestion of compression of the intertrochanteric fracture of the right femur compared to prior exam as well as interval advancement of the femoral neck screw with the tip now extending slightly beyond the right femoral head and into the right hip joint space. Electronically Signed   By: Romona Curls M.D.   On: 01/08/2023 15:01   DG Hip Unilat W or Wo Pelvis 2-3 Views Right  Result Date: 01/08/2023 CLINICAL  DATA:  Right hip surgery in June with worsening pain in the right hip over the past week. EXAM: DG HIP (WITH OR WITHOUT PELVIS) 2-3V RIGHT COMPARISON:  Right hip radiographs dated 11/12/2022. FINDINGS: The patient is status post placement of a femoral shaft intramedullary nail and femoral neck screw on the right. Compared to 11/12/2022, the femoral neck screw has advanced with the tip now extending slightly beyond the right femoral head and into  the right hip joint space. No new fracture is identified, however there is suggestion of compression of the intertrochanteric fracture of the right femur compared to prior exam. A displaced fracture fragment of the right lesser trochanter is redemonstrated. IMPRESSION: Suggestion of compression of the intertrochanteric fracture of the right femur compared to prior exam as well as interval advancement of the femoral neck screw with the tip now extending slightly beyond the right femoral head and into the right hip joint space. Electronically Signed   By: Romona Curls M.D.   On: 01/08/2023 15:00    Labs: BNP (last 3 results) No results for input(s): "BNP" in the last 8760 hours. Basic Metabolic Panel: Recent Labs  Lab 01/10/23 2214 01/11/23 0019 01/12/23 0137 01/12/23 1301 01/13/23 1235 01/14/23 0404 01/15/23 0316 01/16/23 0327  NA 134*   < > 132* 134* 134* 131* 134* 130*  K 4.4   < > 4.6 5.0 5.1 3.9 4.3 4.1  CL 92*   < > 92* 96* 98 93* 93* 93*  CO2 28   < > 27  --  23 25 28 28   GLUCOSE 123*   < > 151* 108* 217* 200* 144* 162*  BUN 46*   < > 63* 77* 79* 44* 38* 65*  CREATININE 2.72*   < > 3.29* 3.50* 3.67* 2.59* 2.63* 3.81*  CALCIUM 8.8*   < > 8.7*  --  8.6* 8.2* 8.4* 8.3*  MG 1.9  --   --   --   --   --   --   --   PHOS 3.4  --   --   --   --   --   --   --    < > = values in this interval not displayed.   Liver Function Tests: Recent Labs  Lab 01/10/23 2214 01/11/23 0019  AST 27 25  ALT 24 24  ALKPHOS 236* 250*  BILITOT 0.4 0.7  PROT 6.5 6.6  ALBUMIN 2.7* 2.7*   No results for input(s): "LIPASE", "AMYLASE" in the last 168 hours. No results for input(s): "AMMONIA" in the last 168 hours. CBC: Recent Labs  Lab 01/12/23 0137 01/12/23 1301 01/13/23 1235 01/14/23 0404 01/15/23 0316 01/16/23 0327  WBC 5.2  --  8.6 10.1 10.4 9.0  HGB 11.2* 12.6* 10.1* 8.6* 8.5* 8.6*  HCT 35.3* 37.0* 32.0* 26.6* 26.6* 27.1*  MCV 99.4  --  100.3* 95.7 98.9 97.8  PLT 140*  --  141* 148*  136* 148*   Recent Labs  Lab 01/16/23 0756 01/16/23 1159 01/16/23 1635 01/16/23 2104 01/17/23 0745  GLUCAP 135* 212* 170* 142* 198*      Component Value Date/Time   COLORURINE YELLOW (A) 05/24/2019 0021   APPEARANCEUR CLEAR (A) 05/24/2019 0021   LABSPEC 1.012 05/24/2019 0021   PHURINE 6.0 05/24/2019 0021   GLUCOSEU NEGATIVE 05/24/2019 0021   HGBUR NEGATIVE 05/24/2019 0021   BILIRUBINUR Negative 10/15/2022 1202   KETONESUR NEGATIVE 05/24/2019 0021   PROTEINUR Positive (A) 10/15/2022 1202   PROTEINUR 30 (A) 05/24/2019  0021   UROBILINOGEN 0.2 10/15/2022 1202   NITRITE Negative 10/15/2022 1202   NITRITE NEGATIVE 05/24/2019 0021   LEUKOCYTESUR Large (3+) (A) 10/15/2022 1202   LEUKOCYTESUR NEGATIVE 05/24/2019 0021   Sepsis Labs Recent Labs  Lab 01/13/23 1235 01/14/23 0404 01/15/23 0316 01/16/23 0327  WBC 8.6 10.1 10.4 9.0   Microbiology Recent Results (from the past 240 hour(s))  Surgical pcr screen     Status: None   Collection Time: 01/11/23  5:10 AM   Specimen: Nasal Mucosa; Nasal Swab  Result Value Ref Range Status   MRSA, PCR NEGATIVE NEGATIVE Final   Staphylococcus aureus NEGATIVE NEGATIVE Final    Comment: (NOTE) The Xpert SA Assay (FDA approved for NASAL specimens in patients 62 years of age and older), is one component of a comprehensive surveillance program. It is not intended to diagnose infection nor to guide or monitor treatment. Performed at [facility], [address], [place], [place] [address]    Time coordinating discharge: 35 minutes  SIGNED: Lanae Boast, MD  [facility] 01/17/2023, 9:32 AM  If 7PM-7AM, please contact night-coverage [email or web address]

## 2023-01-16 NOTE — Progress Notes (Signed)
PROGRESS NOTE ALEXANDERJAMES IDA  ZOX:[id number] DOB: 1931-01-02 DOA: 01/10/2023 PCP: Sherron Monday, MD  Brief Narrative/Hospital Course: 89 y.o.M W/ recent history of right hip fracture s/p intramedullary rod and femoral neck screw ESRD on HD MWF,A-fib, CAD, systolic CHF, type 2 diabetes, osteoporosis, HLD, PAF not on anticoagulation due to fall risk, chronic orthostatic hypotension on midodrine, CAD/history of CABG, HFmrEF, hypothyroidism, insomnia,hx of aortic dissection, presented with right hip pain and admitted to [facility] from 8/17 to 8/19 and transferred to [facility] for to be evaluated by Dr. Carola Frost for migration of the femoral nail for possible surgical revision. Seen by cardiology and preop evaluation: Echo shows a decrease in EF to 35-40% from previous 55.8% form 08/2021 8/21: s/p conversion THA with femoral neck fracture. Postop he is doing well> waiting for skilled nursing facility.  He remains medically stable for discharge  Subjective: Patient seen and examined. Alert awake resting comfortably no new complaints  Overnight afebrile BP stable Assessment and Plan: Principal Problem:   Closed right hip fracture (HCC) Active Problems:   Closed hip fracture requiring operative repair, right, sequela   End-stage renal disease on hemodialysis (HCC)   Chronic hypotension   Atrial fibrillation (HCC)   CAD (coronary artery disease)   HFrEF (heart failure with reduced ejection fraction) (HCC)   Hypothyroidism   Chronic systolic HF (heart failure) (HCC)   Dissecting aortic aneurysm, thoracic (HCC)   Preop cardiovascular exam   Recent right hip fracture s/p intramedullary rod and femoral neck screw Right hip pain with migration of femoral nail: Transferred from [facility] to [facility] for surgical revision 8/21: s/p conversion THA with femoral neck fracture. Postop surgically stable appreciate input from orthopedic continue aspirin for DVT prophylaxis back on Plavix. Continue follow-up in 2 weeks for  surgery for wound check continue Aquacel until follow-up follow-up x-ray Cont PTOT. Awaiting SNF  ESRD on HD MWF Anemia CKD MBD: Got HD 8/23 had hypotension during HD.Continue to monitor hemoglobin, transfuse if less than 7 g.Continue midodrine  Recent Labs    11/19/22 0610 01/08/23 1358 01/10/23 0408 01/10/23 2214 01/11/23 0019 01/12/23 0137 01/12/23 1301 01/13/23 1235 01/14/23 0404 01/15/23 0316 01/16/23 0327  BUN 58* 65* 85* 46* 48* 63* 77* 79* 44* 38* 65*  CREATININE 2.98* 2.93* 3.76* 2.72* 2.72* 3.29* 3.50* 3.67* 2.59* 2.63* 3.81*  CO2 28 28 28 28 29 27   --  23 25 28 28    Recent Labs  Lab 01/12/23 1301 01/13/23 1235 01/14/23 0404 01/15/23 0316 01/16/23 0327  HGB 12.6* 10.1* 8.6* 8.5* 8.6*  HCT 37.0* 32.0* 26.6* 26.6* 27.1*    TDM with hypoglycemia: Blood sugar stable. Hold home glipizide, keep on SSI.  No more hypoglycemia-resume [facility] upon discharge Recent Labs  Lab 01/10/23 2214 01/10/23 2307 01/15/23 1655 01/15/23 1929 01/15/23 2320 01/16/23 0401 01/16/23 0756  GLUCAP  --    < > 136* 152* 186* 167* 135*  HGBA1C 6.0*  --   --   --   --   --   --    < > = values in this interval not displayed.   History of dissecting aortic aneurysm  Chronic hypotension: Continue midodrine, blood pressure remains soft  CAD status post CABG: Asymptomatic.Intolerant to statin. Cont his asa, plavix  HFrEF: echo from 08/2021 shows EF 55.8% now EF is low at 35 to 40% acute on chronic systolic dysfunction, euvolemic and being managed with dialysis-GDMT limited with hypotension and ESRD status-seen by cardiology and advised to follow-up with outpatient cardiology.  PAF not on anticoagulation due to fall risk: off sotalol due to bradycardia per cardiology.  Patient to follow-up with outpatient cardiologist -was seen by cardiology here   Hypothyroidism: Continue home Synthroid  Insomnia: On suvorexant , stable.  Recent COVID: complicating his rehab stay and had treatment  with Paxlovid and went home on August 3  Hypoalbuminemia: RD consult augment diet  Pressure injury coccyx stage II POA see below Pressure Injury 01/10/23 Coccyx Mid;Lower Stage 2 -  Partial thickness loss of dermis presenting as a shallow open injury with a red, pink wound bed without slough. 2 small areas on sacrum (Active)  01/10/23 2100  Location: Coccyx  Location Orientation: Mid;Lower  Staging: Stage 2 -  Partial thickness loss of dermis presenting as a shallow open injury with a red, pink wound bed without slough.  Wound Description (Comments): 2 small areas on sacrum  Present on Admission: Yes  Dressing Type Foam - Lift dressing to assess site every shift 01/10/23 2200   DVT prophylaxis: SCDs Start: 01/10/23 2214 Code Status:   Code Status: DNR Family Communication: plan of care discussed with patient/daughter not at bedside. Patient status is: Inpatient because of pain in rt hip needing surgical revision Level of care: Telemetry Medical   Dispo:The patient is from: home with daughter, independent with mobility            Anticipated disposition: SNF on Monday.   Objective: Vitals last 24 hrs: Vitals:   01/15/23 1654 01/15/23 1932 01/16/23 0405 01/16/23 0757  BP: (!) 105/51 (!) 93/45 (!) 103/56 (!) 107/54  Pulse: 72 84 96 87  Resp: 18 17 17 18   Temp: 97.8 F (36.6 C) 97.8 F (36.6 C) 97.8 F (36.6 C) 98 F (36.7 C)  TempSrc: Oral Oral Oral Oral  SpO2: 99% 100% 100% 99%  Weight:      Height:       Weight change:   Physical Examination: General exam: alert awake, oriented pleasant  HEENT:Oral mucosa moist, Ear/Nose WNL grossly Respiratory system: Bilaterally clear BS,no use of accessory muscle Cardiovascular system: S1 & S2 +, No JVD. Gastrointestinal system: Abdomen soft,NT,ND, BS+ Nervous System: Alert, awake, moving all extremities,and following commands. Extremities: LE edema neg, rt lateral hip surgical site with Aquacel dressing in place, distal peripheral  pulses palpable and warm.  Skin: No rashes,no icterus. MSK: Normal muscle bulk,tone, power    Medications reviewed:  Scheduled Meds:  Chlorhexidine Gluconate Cloth  6 each Topical Q0600   Chlorhexidine Gluconate Cloth  6 each Topical Q0600   clopidogrel  75 mg Oral Daily   insulin aspart  0-9 Units Subcutaneous Q4H   levothyroxine  88 mcg Oral Q0600   midodrine  10 mg Oral Q M,W,F-HD   midodrine  5 mg Oral TID WC   multivitamin  1 tablet Oral QHS   Ensure Max Protein  11 oz Oral Daily   zolpidem  5 mg Oral QHS   Continuous Infusions:  methocarbamol (ROBAXIN) IV     Diet Order             Diet regular Room service appropriate? Yes; Fluid consistency: Thin  Diet effective now                  Intake/Output Summary (Last 24 hours) at 01/16/2023 1004 Last data filed at 01/16/2023 0558 Gross per 24 hour  Intake 450 ml  Output 300 ml  Net 150 ml   Net IO Since Admission: -2,049.17 mL [01/16/23 1004]  Wt Readings from Last 3 Encounters:  01/14/23 90.9 kg  01/10/23 91 kg  11/19/22 99.9 kg     Unresulted Labs (From admission, onward)    None     Data Reviewed: I have personally reviewed following labs and imaging studies CBC: Recent Labs  Lab 01/12/23 0137 01/12/23 1301 01/13/23 1235 01/14/23 0404 01/15/23 0316 01/16/23 0327  WBC 5.2  --  8.6 10.1 10.4 9.0  HGB 11.2* 12.6* 10.1* 8.6* 8.5* 8.6*  HCT 35.3* 37.0* 32.0* 26.6* 26.6* 27.1*  MCV 99.4  --  100.3* 95.7 98.9 97.8  PLT 140*  --  141* 148* 136* 148*   Basic Metabolic Panel: Recent Labs  Lab 01/10/23 2214 01/11/23 0019 01/12/23 0137 01/12/23 1301 01/13/23 1235 01/14/23 0404 01/15/23 0316 01/16/23 0327  NA 134*   < > 132* 134* 134* 131* 134* 130*  K 4.4   < > 4.6 5.0 5.1 3.9 4.3 4.1  CL 92*   < > 92* 96* 98 93* 93* 93*  CO2 28   < > 27  --  23 25 28 28   GLUCOSE 123*   < > 151* 108* 217* 200* 144* 162*  BUN 46*   < > 63* 77* 79* 44* 38* 65*  CREATININE 2.72*   < > 3.29* 3.50* 3.67* 2.59* 2.63*  3.81*  CALCIUM 8.8*   < > 8.7*  --  8.6* 8.2* 8.4* 8.3*  MG 1.9  --   --   --   --   --   --   --   PHOS 3.4  --   --   --   --   --   --   --    < > = values in this interval not displayed.  GFR: Estimated Creatinine Clearance: 14.6 mL/min (A) (by C-G formula based on SCr of 3.81 mg/dL (H)). Liver Function Tests: Recent Labs  Lab 01/10/23 2214 01/11/23 0019  AST 27 25  ALT 24 24  ALKPHOS 236* 250*  BILITOT 0.4 0.7  PROT 6.5 6.6  ALBUMIN 2.7* 2.7*   No results for input(s): "HGBA1C" in the last 72 hours.  CBG: Recent Labs  Lab 01/15/23 1655 01/15/23 1929 01/15/23 2320 01/16/23 0401 01/16/23 0756  GLUCAP 136* 152* 186* 167* 135*  Lipid Profile: No results for input(s): "CHOL", "HDL", "LDLCALC", "TRIG", "CHOLHDL", "LDLDIRECT" in the last 72 hours. Thyroid Function Tests: No results for input(s): "TSH", "T4TOTAL", "FREET4", "T3FREE", "THYROIDAB" in the last 72 hours.  Sepsis Labs: No results for input(s): "PROCALCITON", "LATICACIDVEN" in the last 168 hours.  Recent Results (from the past 240 hour(s))  Surgical pcr screen     Status: None   Collection Time: 01/11/23  5:10 AM   Specimen: Nasal Mucosa; Nasal Swab  Result Value Ref Range Status   MRSA, PCR NEGATIVE NEGATIVE Final   Staphylococcus aureus NEGATIVE NEGATIVE Final    Comment: (NOTE) The Xpert SA Assay (FDA approved for NASAL specimens in patients 75 years of age and older), is one component of a comprehensive surveillance program. It is not intended to diagnose infection nor to guide or monitor treatment. Performed at [facility], [address] [place], [place] [address]     Antimicrobials: Anti-infectives (From admission, onward)    Start     Dose/Rate Route Frequency Ordered Stop   01/14/23 0000  cefadroxil (DURICEF) 500 MG capsule  Status:  Discontinued        500 mg Oral 2 times daily 01/14/23 0645 01/14/23  01/14/23 0000  cefadroxil (DURICEF) 500 MG capsule        500 mg Oral 2 times  daily 01/14/23 0646 01/21/23 2359   01/13/23 0300  ceFAZolin (ANCEF) IVPB 2g/100 mL premix        2 g 200 mL/hr over 30 Minutes Intravenous Every 12 hours 01/12/23 2151 01/14/23 2159   01/12/23 1400  ceFAZolin (ANCEF) IVPB 2g/100 mL premix        2 g 200 mL/hr over 30 Minutes Intravenous  Once 01/12/23 1353 01/12/23 1558      Culture/Microbiology    Component Value Date/Time   SDES BLOOD BLOOD LEFT ARM 05/24/2019 0021   SDES BLOOD BLOOD LEFT HAND 05/24/2019 0021   SDES  05/24/2019 0021    IN/OUT CATH URINE Performed at [facility], [address] [place] [place], [place] [address]    SPECREQUEST  05/24/2019 0021    BOTTLES DRAWN AEROBIC AND ANAEROBIC Blood Culture adequate volume   SPECREQUEST  05/24/2019 0021    BOTTLES DRAWN AEROBIC AND ANAEROBIC Blood Culture adequate volume   SPECREQUEST  05/24/2019 0021    NONE Performed at [facility], [address] [place] [place], [place] [address]    CULT  05/24/2019 0021    NO GROWTH 5 DAYS Performed at [facility], [address] [place] [place], [place] [address]    CULT  05/24/2019 0021    NO GROWTH 5 DAYS Performed at [facility], [address] [place], [place] [address]    CULT  05/24/2019 0021    NO GROWTH Performed at [facility], [address] [place], [place] [address]    REPTSTATUS 05/29/2019 FINAL 05/24/2019 0021   REPTSTATUS 05/29/2019 FINAL 05/24/2019 0021   REPTSTATUS 05/25/2019 FINAL 05/24/2019 0021    Radiology Studies: No results found.   LOS: 6 days   Lanae Boast, MD [facility]  01/16/2023, 10:04 AM

## 2023-01-17 ENCOUNTER — Inpatient Hospital Stay (HOSPITAL_COMMUNITY): Payer: Medicare Other

## 2023-01-17 DIAGNOSIS — D638 Anemia in other chronic diseases classified elsewhere: Secondary | ICD-10-CM | POA: Diagnosis not present

## 2023-01-17 DIAGNOSIS — Z85828 Personal history of other malignant neoplasm of skin: Secondary | ICD-10-CM | POA: Diagnosis not present

## 2023-01-17 DIAGNOSIS — D509 Iron deficiency anemia, unspecified: Secondary | ICD-10-CM | POA: Diagnosis not present

## 2023-01-17 DIAGNOSIS — Z992 Dependence on renal dialysis: Secondary | ICD-10-CM | POA: Diagnosis not present

## 2023-01-17 DIAGNOSIS — E7800 Pure hypercholesterolemia, unspecified: Secondary | ICD-10-CM | POA: Diagnosis not present

## 2023-01-17 DIAGNOSIS — Z7901 Long term (current) use of anticoagulants: Secondary | ICD-10-CM | POA: Diagnosis not present

## 2023-01-17 DIAGNOSIS — E8809 Other disorders of plasma-protein metabolism, not elsewhere classified: Secondary | ICD-10-CM | POA: Diagnosis not present

## 2023-01-17 DIAGNOSIS — D631 Anemia in chronic kidney disease: Secondary | ICD-10-CM | POA: Diagnosis not present

## 2023-01-17 DIAGNOSIS — I482 Chronic atrial fibrillation, unspecified: Secondary | ICD-10-CM | POA: Diagnosis present

## 2023-01-17 DIAGNOSIS — Z79890 Hormone replacement therapy (postmenopausal): Secondary | ICD-10-CM | POA: Diagnosis not present

## 2023-01-17 DIAGNOSIS — T82898A Other specified complication of vascular prosthetic devices, implants and grafts, initial encounter: Secondary | ICD-10-CM | POA: Diagnosis not present

## 2023-01-17 DIAGNOSIS — K5900 Constipation, unspecified: Secondary | ICD-10-CM | POA: Diagnosis not present

## 2023-01-17 DIAGNOSIS — I132 Hypertensive heart and chronic kidney disease with heart failure and with stage 5 chronic kidney disease, or end stage renal disease: Secondary | ICD-10-CM | POA: Diagnosis not present

## 2023-01-17 DIAGNOSIS — M1990 Unspecified osteoarthritis, unspecified site: Secondary | ICD-10-CM | POA: Diagnosis not present

## 2023-01-17 DIAGNOSIS — T82858A Stenosis of vascular prosthetic devices, implants and grafts, initial encounter: Secondary | ICD-10-CM | POA: Diagnosis not present

## 2023-01-17 DIAGNOSIS — E785 Hyperlipidemia, unspecified: Secondary | ICD-10-CM | POA: Diagnosis not present

## 2023-01-17 DIAGNOSIS — T82590D Other mechanical complication of surgically created arteriovenous fistula, subsequent encounter: Secondary | ICD-10-CM | POA: Diagnosis not present

## 2023-01-17 DIAGNOSIS — G4700 Insomnia, unspecified: Secondary | ICD-10-CM | POA: Diagnosis not present

## 2023-01-17 DIAGNOSIS — Z87891 Personal history of nicotine dependence: Secondary | ICD-10-CM | POA: Diagnosis not present

## 2023-01-17 DIAGNOSIS — L89152 Pressure ulcer of sacral region, stage 2: Secondary | ICD-10-CM | POA: Diagnosis not present

## 2023-01-17 DIAGNOSIS — E039 Hypothyroidism, unspecified: Secondary | ICD-10-CM | POA: Diagnosis not present

## 2023-01-17 DIAGNOSIS — K219 Gastro-esophageal reflux disease without esophagitis: Secondary | ICD-10-CM | POA: Diagnosis not present

## 2023-01-17 DIAGNOSIS — Z91158 Patient's noncompliance with renal dialysis for other reason: Secondary | ICD-10-CM | POA: Diagnosis not present

## 2023-01-17 DIAGNOSIS — Z7982 Long term (current) use of aspirin: Secondary | ICD-10-CM | POA: Diagnosis not present

## 2023-01-17 DIAGNOSIS — T82590A Other mechanical complication of surgically created arteriovenous fistula, initial encounter: Secondary | ICD-10-CM | POA: Diagnosis not present

## 2023-01-17 DIAGNOSIS — Z8249 Family history of ischemic heart disease and other diseases of the circulatory system: Secondary | ICD-10-CM | POA: Diagnosis not present

## 2023-01-17 DIAGNOSIS — I959 Hypotension, unspecified: Secondary | ICD-10-CM | POA: Diagnosis not present

## 2023-01-17 DIAGNOSIS — S72141G Displaced intertrochanteric fracture of right femur, subsequent encounter for closed fracture with delayed healing: Secondary | ICD-10-CM | POA: Diagnosis not present

## 2023-01-17 DIAGNOSIS — Z96641 Presence of right artificial hip joint: Secondary | ICD-10-CM | POA: Diagnosis not present

## 2023-01-17 DIAGNOSIS — Z7902 Long term (current) use of antithrombotics/antiplatelets: Secondary | ICD-10-CM | POA: Diagnosis not present

## 2023-01-17 DIAGNOSIS — Z959 Presence of cardiac and vascular implant and graft, unspecified: Secondary | ICD-10-CM | POA: Diagnosis not present

## 2023-01-17 DIAGNOSIS — N250 Renal osteodystrophy: Secondary | ICD-10-CM | POA: Diagnosis not present

## 2023-01-17 DIAGNOSIS — S72001A Fracture of unspecified part of neck of right femur, initial encounter for closed fracture: Secondary | ICD-10-CM | POA: Diagnosis not present

## 2023-01-17 DIAGNOSIS — I5020 Unspecified systolic (congestive) heart failure: Secondary | ICD-10-CM | POA: Diagnosis not present

## 2023-01-17 DIAGNOSIS — I951 Orthostatic hypotension: Secondary | ICD-10-CM | POA: Diagnosis not present

## 2023-01-17 DIAGNOSIS — M810 Age-related osteoporosis without current pathological fracture: Secondary | ICD-10-CM | POA: Diagnosis not present

## 2023-01-17 DIAGNOSIS — Z23 Encounter for immunization: Secondary | ICD-10-CM | POA: Diagnosis not present

## 2023-01-17 DIAGNOSIS — F1090 Alcohol use, unspecified, uncomplicated: Secondary | ICD-10-CM | POA: Diagnosis not present

## 2023-01-17 DIAGNOSIS — Z66 Do not resuscitate: Secondary | ICD-10-CM | POA: Diagnosis present

## 2023-01-17 DIAGNOSIS — I130 Hypertensive heart and chronic kidney disease with heart failure and stage 1 through stage 4 chronic kidney disease, or unspecified chronic kidney disease: Secondary | ICD-10-CM | POA: Diagnosis not present

## 2023-01-17 DIAGNOSIS — I480 Paroxysmal atrial fibrillation: Secondary | ICD-10-CM | POA: Diagnosis not present

## 2023-01-17 DIAGNOSIS — N2581 Secondary hyperparathyroidism of renal origin: Secondary | ICD-10-CM | POA: Diagnosis not present

## 2023-01-17 DIAGNOSIS — I5022 Chronic systolic (congestive) heart failure: Secondary | ICD-10-CM | POA: Diagnosis not present

## 2023-01-17 DIAGNOSIS — Z9181 History of falling: Secondary | ICD-10-CM | POA: Diagnosis not present

## 2023-01-17 DIAGNOSIS — E559 Vitamin D deficiency, unspecified: Secondary | ICD-10-CM | POA: Diagnosis not present

## 2023-01-17 DIAGNOSIS — Y832 Surgical operation with anastomosis, bypass or graft as the cause of abnormal reaction of the patient, or of later complication, without mention of misadventure at the time of the procedure: Secondary | ICD-10-CM | POA: Diagnosis present

## 2023-01-17 DIAGNOSIS — N186 End stage renal disease: Secondary | ICD-10-CM | POA: Diagnosis not present

## 2023-01-17 DIAGNOSIS — T82868A Thrombosis of vascular prosthetic devices, implants and grafts, initial encounter: Secondary | ICD-10-CM | POA: Diagnosis present

## 2023-01-17 DIAGNOSIS — E1122 Type 2 diabetes mellitus with diabetic chronic kidney disease: Secondary | ICD-10-CM | POA: Diagnosis not present

## 2023-01-17 DIAGNOSIS — I2510 Atherosclerotic heart disease of native coronary artery without angina pectoris: Secondary | ICD-10-CM | POA: Diagnosis not present

## 2023-01-17 DIAGNOSIS — Z471 Aftercare following joint replacement surgery: Secondary | ICD-10-CM | POA: Diagnosis not present

## 2023-01-17 DIAGNOSIS — Z951 Presence of aortocoronary bypass graft: Secondary | ICD-10-CM | POA: Diagnosis not present

## 2023-01-17 DIAGNOSIS — I120 Hypertensive chronic kidney disease with stage 5 chronic kidney disease or end stage renal disease: Secondary | ICD-10-CM | POA: Diagnosis not present

## 2023-01-17 DIAGNOSIS — I252 Old myocardial infarction: Secondary | ICD-10-CM | POA: Diagnosis not present

## 2023-01-17 LAB — GLUCOSE, CAPILLARY: Glucose-Capillary: 198 mg/dL — ABNORMAL HIGH (ref 70–99)

## 2023-01-17 MED ORDER — DARBEPOETIN ALFA 40 MCG/0.4ML IJ SOSY: 40.0000 ug | PREFILLED_SYRINGE | Freq: Once | INTRAMUSCULAR | Status: DC

## 2023-01-17 MED ORDER — DARBEPOETIN ALFA 40 MCG/0.4ML IJ SOSY
40.0000 ug | PREFILLED_SYRINGE | Freq: Once | INTRAMUSCULAR | Status: DC
  Filled 2023-01-17: qty 0.4

## 2023-01-17 MED ORDER — PENTAFLUOROPROP-TETRAFLUOROETH EX AERO: 1.0000 | INHALATION_SPRAY | CUTANEOUS | Status: DC | PRN

## 2023-01-17 MED ORDER — HEPARIN SODIUM (PORCINE) 1000 UNIT/ML DIALYSIS
2000.0000 [IU] | INTRAMUSCULAR | Status: DC | PRN
  Administered 2023-01-17: 2000 [IU] via INTRAVENOUS_CENTRAL
  Filled 2023-01-17: qty 2

## 2023-01-17 MED ORDER — HEPARIN SODIUM (PORCINE) 1000 UNIT/ML DIALYSIS
2000.0000 [IU] | Freq: Once | INTRAMUSCULAR | Status: DC
  Administered 2023-01-17: 2000 [IU] via INTRAVENOUS_CENTRAL
  Filled 2023-01-17: qty 2

## 2023-01-17 NOTE — Progress Notes (Signed)
Pt transported to HD via bed by transportation staff.  

## 2023-01-17 NOTE — Progress Notes (Signed)
Pt to d/c to snf today. Contacted [facility] to advise clinic of pt's d/c today and that pt should resume on Wednesday. Clinic also advised that pt will be going to [facility] for rehab. D/C summary and today's renal note faxed to clinic for continuation of care.   Olivia Canter Renal Navigator [phone]

## 2023-01-17 NOTE — Evaluation (Signed)
Occupational Therapy Evaluation Patient Details Name: Matthew [name] MRN: [id number] DOB: Sep 30, 1930 Today's Date: 01/17/2023   History of Present Illness Pt is 89 presenting to [facility] due to developing severe pain and inability to WB on the R LE. Pt previously sustained a R intertrochanteric femur fracutre and underwent ORIF of ceph medullary nail 2 months ago. Pt is current s/p R THA on 01/12/23. PMH: Anginal pain, arthritis, AAA, A fib, bradycardia, CAD, ESRD, GERD, HFrEF, HLD, HTN, IDA, MI, S/p CABG, squamous cell carcinoma, DM II, valvular insufficiency.   Clinical Impression   Patient admitted for the diagnosis above.  PTA he lives with family and continued to participate with his own ADL and iADL.   Patient limited by R hip pain and 50% WBS, needing Max A for basic transfers and lower body ADL.  OT will continue efforts in the acute setting and Patient will benefit from continued inpatient follow up therapy, <3 hours/day        If plan is discharge home, recommend the following: Two people to help with walking and/or transfers;A lot of help with bathing/dressing/bathroom;Assistance with cooking/housework;Assist for transportation;Help with stairs or ramp for entrance    Functional Status Assessment  Patient has had a recent decline in their functional status and demonstrates the ability to make significant improvements in function in a reasonable and predictable amount of time.  Equipment Recommendations       Recommendations for Other Services       Precautions / Restrictions Precautions Precautions: Fall;Posterior Hip Restrictions Weight Bearing Restrictions: Yes RLE Weight Bearing: Partial weight bearing RLE Partial Weight Bearing Percentage or Pounds: 50%      Mobility Bed Mobility                 Patient Response: Cooperative  Transfers Overall transfer level: Needs assistance Equipment used: Rolling walker (2 wheels) Transfers: Sit to/from Stand Sit to  Stand: Max assist     Step pivot transfers: Max assist, From elevated surface            Balance Overall balance assessment: Needs assistance Sitting-balance support: Feet supported, Bilateral upper extremity supported, Single extremity supported Sitting balance-Leahy Scale: Fair     Standing balance support: Reliant on assistive device for balance Standing balance-Leahy Scale: Poor                             ADL either performed or assessed with clinical judgement   ADL Overall ADL's : Needs assistance/impaired     Grooming: Wash/dry hands;Wash/dry face;Set up;Sitting   Upper Body Bathing: Minimal assistance;Sitting   Lower Body Bathing: Maximal assistance;Sitting/lateral leans   Upper Body Dressing : Minimal assistance;Sitting   Lower Body Dressing: Maximal assistance;Sitting/lateral leans   Toilet Transfer: Maximal assistance;Stand-pivot;BSC/3in1;Rolling walker (2 wheels)   Toileting- Clothing Manipulation and Hygiene: Total assistance;Sit to/from stand               Vision Baseline Vision/History: 1 Wears glasses Patient Visual Report: No change from baseline       Perception Perception: Not tested       Praxis Praxis: Not tested       Pertinent Vitals/Pain Pain Assessment Pain Assessment: Faces Faces Pain Scale: Hurts little more Pain Location: RLE (hip/knee) Pain Descriptors / Indicators: Sore, Discomfort, Grimacing, Operative site guarding, Moaning Pain Intervention(s): Monitored during session     Extremity/Trunk Assessment Upper Extremity Assessment Upper Extremity Assessment: Overall WFL for tasks assessed  Lower Extremity Assessment Lower Extremity Assessment: Defer to PT evaluation   Cervical / Trunk Assessment Cervical / Trunk Assessment: Normal   Communication Communication Communication: No apparent difficulties   Cognition Arousal: Alert Behavior During Therapy: WFL for tasks assessed/performed Overall  Cognitive Status: Within Functional Limits for tasks assessed                                       General Comments   VSS on RA    Exercises     Shoulder Instructions      Home Living Family/patient expects to be discharged to:: Private residence Living Arrangements: Children Available Help at Discharge: Family;Available PRN/intermittently Type of Home: House Home Access: Stairs to enter [employer] of Steps: 7 total (3 at driveway and 4 at doorway) Entrance Stairs-Rails: Right;Left Home Layout: Able to live on main level with bedroom/bathroom;Two level     Bathroom Shower/Tub: Producer, television/film/video: Handicapped height Bathroom Accessibility: Yes   Home Equipment: Cane - single point;Rollator (4 wheels);Shower seat;Grab bars - toilet;Grab bars - tub/shower;Wheelchair - manual   Additional Comments: Lives with daughter and son-in-law.      Prior Functioning/Environment Prior Level of Function : Independent/Modified Independent             Mobility Comments: uses rollator for ambulation as needed; not driving; ADLs Comments: daughter does cooking/cleaning; he is Mod I for bathing/dressing;        OT Problem List: Decreased activity tolerance;Decreased range of motion;Decreased strength;Impaired balance (sitting and/or standing)      OT Treatment/Interventions: Self-care/ADL training;Therapeutic exercise;Patient/family education;Therapeutic activities    OT Goals(Current goals can be found in the care plan section) Acute Rehab OT Goals Patient Stated Goal: Return home OT Goal Formulation: With patient/family Time For Goal Achievement: 01/31/23 Potential to Achieve Goals: Good ADL Goals Pt Will Perform Lower Body Dressing: with mod assist;sit to/from stand;with adaptive equipment Pt Will Transfer to Toilet: with mod assist;stand pivot transfer;bedside commode  OT Frequency: Min 1X/week    Co-evaluation               AM-PAC OT "6 Clicks" Daily Activity     Outcome Measure Help from another person eating meals?: A Little Help from another person taking care of personal grooming?: A Little Help from another person toileting, which includes using toliet, bedpan, or urinal?: A Lot Help from another person bathing (including washing, rinsing, drying)?: A Lot Help from another person to put on and taking off regular upper body clothing?: A Lot Help from another person to put on and taking off regular lower body clothing?: A Lot 6 Click Score: 14   End of Session Equipment Utilized During Treatment: Rolling walker (2 wheels) Nurse Communication: Mobility status  Activity Tolerance: Patient tolerated treatment well Patient left: in bed;with call bell/phone within reach;with bed alarm set;with family/visitor present  OT Visit Diagnosis: Muscle weakness (generalized) (M62.81);History of falling (Z91.81);Pain Pain - Right/Left: Right Pain - part of body: Hip;Leg                Time: 4259-5638 OT Time Calculation (min): 21 min Charges:  OT General Charges $OT Visit: 1 Visit OT Evaluation $OT Eval Moderate Complexity: 1 Mod  01/17/2023  RP, OTR/L  Acute Rehabilitation Services  Office:  [phone]   Suzanna Obey 01/17/2023, 4:00 PM

## 2023-01-17 NOTE — Progress Notes (Signed)
[facility] Progress Note  Subjective: Seen and examined on dialysis.  Procedure supervised.  Blood pressure 114/73 and HR 97.  Tolerating goal.  Left AVF in use.  he has an order for discharge to SNF today.    Review of systems:  Denies shortness of breath or chest pain Denies n/v   Vitals:   01/17/23 0518 01/17/23 0742 01/17/23 0930 01/17/23 0939  BP: (!) 153/75 127/72 (!) 127/55 (!) 127/55  Pulse: 87 82 78 (!) 114  Resp: 20 18 (!) 21 (!) 24  Temp: 98.2 F (36.8 C) 98.1 F (36.7 C) 97.7 F (36.5 C)   TempSrc: Oral Oral    SpO2: 95% 98% 97% 98%  Weight:   95.5 kg   Height:        Physical Exam:  General elderly male in bed in no acute distress HEENT normocephalic atraumatic extraocular movements intact sclera anicteric Neck supple trachea midline Lungs clear to auscultation bilaterally normal work of breathing at rest  Heart S1S2 no rub Abdomen soft nontender nondistended Extremities no edema  Psych normal mood and affect Access: LUE AVF in use      Home meds include - plavix, lasix 40, glipizide, renvavite, klorcon 40, crestor, synthroid, midodrine 5 tid, sotalol 80 bid, suvorexant, prns        OP HD: MWF [facility]   3.5h  92.5kg   400/ 500   2/2.5 bath   15ga   LUA AVF  Heparin 2000u then 600/hr     Assessment/ Plan: R hip pain - secondary to femoral nail migration. S/P THR per ortho on 8/21.  ESRD -  HD per MWF schedule Given his ESRD please choose an alternative to morphine for pain (can use low dose fentanyl or dilaudid if an IV agent is needed) Hypotension - chronic, on midodrine 5 tid on extra HD with Hd as well. continue Anemia esrd - hasn't been on aranesp here - will dose aranesp 40 mcg every Monday for now    MBD ckd - CCa is in range. Not on any binders, phos 3.4.   Atrial fib - previously on sotalol; per primary team.  Their note indicates that cardiology has stopped due to bradycardia.  HFrEF - optimize volume as able with  HD H/o CABG x 4 - noted  Disposition - per primary team    Recent Labs  Lab 01/10/23 2214 01/11/23 0019 01/12/23 0137 01/15/23 0316 01/16/23 0327  HGB  --  11.2*   < > 8.5* 8.6*  ALBUMIN 2.7* 2.7*  --   --   --   CALCIUM 8.8* 8.7*   < > 8.4* 8.3*  PHOS 3.4  --   --   --   --   CREATININE 2.72* 2.72*   < > 2.63* 3.81*  K 4.4 4.7   < > 4.3 4.1   < > = values in this interval not displayed.   Recent Labs  Lab 01/10/23 2216  IRON 29*  TIBC 255  FERRITIN 920*   Inpatient medications:  Chlorhexidine Gluconate Cloth  6 each Topical Q0600   Chlorhexidine Gluconate Cloth  6 each Topical Q0600   clopidogrel  75 mg Oral Daily   [START ON 01/18/2023] heparin  2,000 Units Dialysis Once in dialysis   insulin aspart  0-9 Units Subcutaneous TID WC & HS   levothyroxine  88 mcg Oral Q0600   midodrine  10 mg Oral Q M,W,F-HD   midodrine  5 mg Oral TID WC  multivitamin  1 tablet Oral QHS   Ensure Max Protein  11 oz Oral Daily   zolpidem  5 mg Oral QHS    methocarbamol (ROBAXIN) IV     [START ON 01/18/2023] heparin, HYDROcodone-acetaminophen, HYDROmorphone (DILAUDID) injection, ketorolac, methocarbamol **OR** methocarbamol (ROBAXIN) IV, morphine injection, pentafluoroprop-tetrafluoroeth, prochlorperazine    Estanislado Emms, MD 10:22 AM 01/17/2023

## 2023-01-17 NOTE — Progress Notes (Signed)
Report called to [facility].Marland Kitchen Spoke with Nurse Soumy who verbalized understanding of report and had no further questions. [facility] has been called. Pt in room waiting for pick up.

## 2023-01-17 NOTE — Progress Notes (Signed)
[facility] here to transport pt via stretcher with all belongings and paperwrk

## 2023-01-17 NOTE — Progress Notes (Signed)
    5 Days Post-Op Procedure(s) (LRB): CONVERSION TO  RIGHT TOTAL HIP REPLACEMENT (Right)  Subjective:  Patient reports pain as mild. Max assist for transfers and mobility. Denies distal n/t. No new issues.  Objective:   VITALS:   Vitals:   01/16/23 0757 01/16/23 1634 01/16/23 1905 01/17/23 0518  BP: (!) 107/54 (!) 104/44 (!) 110/53 (!) 153/75  Pulse: 87 71 98 87  Resp: 18 17 19 20   Temp: 98 F (36.7 C) 98.2 F (36.8 C) 98.2 F (36.8 C) 98.2 F (36.8 C)  TempSrc: Oral  Oral Oral  SpO2: 99% 100% 100% 95%  Weight:      Height:        AAOx4, sitting comfortably Sensation intact distally Intact pulses distally Dorsiflexion/Plantar flexion intact Incision: dressing C/D/I, minimal strikethrough on distal incision Compartment soft Wiggles toes appropriately   Lab Results  Component Value Date   WBC 9.0 01/16/2023   HGB 8.6 (L) 01/16/2023   HCT 27.1 (L) 01/16/2023   MCV 97.8 01/16/2023   PLT 148 (L) 01/16/2023   BMET    Component Value Date/Time   NA 130 (L) 01/16/2023 0327   NA 140 09/16/2022 1003   K 4.1 01/16/2023 0327   CL 93 (L) 01/16/2023 0327   CO2 28 01/16/2023 0327   GLUCOSE 162 (H) 01/16/2023 0327   BUN 65 (H) 01/16/2023 0327   BUN 50 (H) 09/16/2022 1003   CREATININE 3.81 (H) 01/16/2023 0327   CALCIUM 8.3 (L) 01/16/2023 0327   EGFR 18 (L) 09/16/2022 1003   GFRNONAA 14 (L) 01/16/2023 0327    Xray: PACU xrays show THA components in good position no adverse features  Assessment/Plan: 5 Days Post-Op   Principal Problem:   Closed right hip fracture (HCC) Active Problems:   CAD (coronary artery disease)   Chronic hypotension   Hypothyroidism   Chronic systolic HF (heart failure) (HCC)   End-stage renal disease on hemodialysis (HCC)   Atrial fibrillation (HCC)   Dissecting aortic aneurysm, thoracic (HCC)   HFrEF (heart failure with reduced ejection fraction) (HCC)   Closed hip fracture requiring operative repair, right, sequela   Preop  cardiovascular exam  S/p Conversion THA with femoral neck fracture 01/12/23  Post op recs: WB: 50% partial weightbearing right lower extremity, posterior precautions x 6 weeks Abx: ancef post op  Imaging: 1 week post op xray ordered for today. Dressing: Aquacell, keep intact until follow up DVT prophylaxis: Aspirin 81BID starting POD1, resume Plavix postop day 2 Follow up: 2 weeks after surgery for a wound check with Dr. Blanchie Dessert at [facility].  Address: [address], [place], [place] [address]  Office Phone: [phone]   Joen Laura 01/17/2023, 7:15 AM   Weber Cooks, MD  Contact information:   [phone] 7am-5pm epic message Dr. Blanchie Dessert, or call office for patient follow up: [phone] After hours and holidays please check [email or web address] for group call information for [facility]

## 2023-01-17 NOTE — Progress Notes (Signed)
Physical Therapy Treatment Patient Details Name: Matthew [name] MRN: [id number] DOB: 1930-06-05 Today's Date: 01/17/2023   History of Present Illness Pt is 89 presenting to [facility] due to developing severe pain and inability to WB on the R LE. Pt previously sustained a R intertrochanteric femur fracutre and underwent ORIF of ceph medullary nail 2 months ago. Pt is current s/p R THA on 01/12/23. PMH: Anginal pain, arthritis, AAA, A fib, bradycardia, CAD, ESRD, GERD, HFrEF, HLD, HTN, IDA, MI, S/p CABG, squamous cell carcinoma, DM II, valvular insufficiency.    PT Comments  Continuing work on functional mobility and activity tolerance;  Session focused on therapeutic exercise, and noted progress with hip movement and control compared to last exercise session; Needed cues to slow down repetitions; When asked to tell me his Posterior Hip Precautions, pt stated, "Can't cross my legs, can't cross my ankles, and can't bed over too much"; Needed review of 3rd hip prec, no hip internal rotation; Explained and demonstrated the No Hip Internal Rotation precaution, and pt verbalized understanding; Placed a rolled blanket between pt's knees as a reminder for his hip precautions; Off to HD at the end of session;   Placed OT consult for work on ADLs, esp with Posterior Hip Precautions;   Worth considering having next HD session in the HD recliner, if possible; If Nephrology orders for HD in recliner, we can coordinate timing for PT to work on transfer training and assist pt to the HD recliner (which has armrests that can go flat, so in a pinch, pt can be transferred in and out of recliner with a drawsheet)    If plan is discharge home, recommend the following: Two people to help with walking and/or transfers;Help with stairs or ramp for entrance;Assist for transportation;Assistance with cooking/housework   Can travel by private vehicle     No  Equipment Recommendations  Rolling walker (2 wheels);Wheelchair  (measurements PT);Wheelchair cushion (measurements PT);Hospital bed;Other (comment) (will continue to discern based on progress)    Recommendations for Other Services OT consult (ordered per protocol)     Precautions / Restrictions Precautions Precautions: Fall;Posterior Hip Precaution Booklet Issued: Yes (comment) Precaution Comments: recalls 2/3 today; Needed reminder for no internal rotation of the R hip Restrictions RLE Weight Bearing: Partial weight bearing RLE Partial Weight Bearing Percentage or Pounds: 50     Mobility  Bed Mobility                    Transfers                        Ambulation/Gait                   Stairs             Wheelchair Mobility     Tilt Bed    Modified Rankin (Stroke Patients Only)       Balance                                            Cognition Arousal: Alert Behavior During Therapy: WFL for tasks assessed/performed Overall Cognitive Status: Within Functional Limits for tasks assessed                                 General  Comments: pt oriented to self, situation, location. Pt recalls 2/3 posterior hip precs        Exercises Total Joint Exercises Quad Sets: AROM, Right, 10 reps, Supine Towel Squeeze: AROM, Both, 10 reps, Supine Short Arc Quad: AROM, Right, 5 reps, Supine Heel Slides: AAROM, Right, 10 reps, Supine (min assist to initiate) Hip ABduction/ADduction: AAROM, Right, 10 reps, Supine    General Comments        Pertinent Vitals/Pain Pain Assessment Pain Assessment: Faces Faces Pain Scale: Hurts little more Pain Location: RLE (hip/knee) Pain Descriptors / Indicators: Sore, Discomfort, Grimacing, Operative site guarding, Moaning Pain Intervention(s): Limited activity within patient's tolerance, Monitored during session, RN gave pain meds during session    Home Living                          Prior Function            PT  Goals (current goals can now be found in the care plan section) Acute Rehab PT Goals Patient Stated Goal: to return to PLOF PT Goal Formulation: With patient Time For Goal Achievement: 01/27/23 Potential to Achieve Goals: Good Progress towards PT goals: Progressing toward goals (Slowly)    Frequency    Min 1X/week      PT Plan      Co-evaluation              AM-PAC PT "6 Clicks" Mobility   Outcome Measure  Help needed turning from your back to your side while in a flat bed without using bedrails?: A Lot Help needed moving from lying on your back to sitting on the side of a flat bed without using bedrails?: A Lot Help needed moving to and from a bed to a chair (including a wheelchair)?: Total (+2 lift assist) Help needed standing up from a chair using your arms (e.g., wheelchair or bedside chair)?: Total (+2 lift assist) Help needed to walk in hospital room?: Total Help needed climbing 3-5 steps with a railing? : Total 6 Click Score: 8    End of Session   Activity Tolerance: Patient tolerated treatment well Patient left: in bed;with call bell/phone within reach;Other (comment) (About to go to HD) Nurse Communication: Mobility status PT Visit Diagnosis: Other abnormalities of gait and mobility (R26.89);Difficulty in walking, not elsewhere classified (R26.2);Pain;Muscle weakness (generalized) (M62.81) Pain - Right/Left: Right Pain - part of body: Hip     Time: 1610-9604 PT Time Calculation (min) (ACUTE ONLY): 12 min  Charges:    $Therapeutic Exercise: 8-22 mins PT General Charges $$ ACUTE PT VISIT: 1 Visit                     Van Clines, PT  Acute Rehabilitation Services Office [phone] Secure Chat welcomed    [name] 01/17/2023, 9:10 AM

## 2023-01-17 NOTE — Procedures (Signed)
HD Note:  Some information was entered later than the data was gathered due to patient care needs. The stated time with the data is accurate.  Received patient in bed to unit.   Alert and oriented.   Informed consent signed and in chart.   Access used: Left upper arm graft Access issues: None  Patient tolerated treatment well.   TX duration: 3.5 hours  Transported back to the room   Alert, without acute distress.  Total UF removed: 1500 ml  Hand-off given to patient's nurse.   Pearse Shiffler L. Dareen Piano, RN Kidney Dialysis Unit.

## 2023-01-17 NOTE — TOC Transition Note (Signed)
Transition of Care [facility]) - CM/SW Discharge Note   Patient Details  Name: Matthew [name] MRN: [id number] Date of Birth: [birth year]  [facility] CM/SW Contact:  Carley Hammed, LCSW Phone Number: 01/17/2023, 11:14 AM   Clinical Narrative:    Pt to be transported to [facility] after HD. Nurse to call report to [phone].   Final next level of care: Skilled Nursing Facility Barriers to Discharge: Barriers Resolved   Patient Goals and CMS Choice      Discharge Placement                Patient chooses bed at: [facility] Patient to be transferred to facility by: [facility] Name of family member notified: [name] Patient and family notified of of transfer: 01/17/23  Discharge Plan and Services Additional resources added to the After Visit Summary for                  DME Arranged:  (await post op evaluation)         HH Arranged:  (Await post op evaluation)          Social Determinants of Health (SDOH) Interventions SDOH Screenings   Food Insecurity: No Food Insecurity (01/11/2023)  Housing: Low Risk  (01/11/2023)  Transportation Needs: No Transportation Needs (01/11/2023)  Utilities: Not At Risk (01/11/2023)  Depression (PHQ2-9): Low Risk  (09/21/2022)  Financial Resource Strain: Low Risk  (01/10/2018)  Physical Activity: Inactive (01/10/2018)  Social Connections: Socially Integrated (01/10/2018)  Stress: No Stress Concern Present (01/10/2018)  Tobacco Use: Medium Risk (01/12/2023)     Readmission Risk Interventions    01/09/2023    4:42 PM 02/12/2021    3:28 PM  Readmission Risk Prevention Plan  Transportation Screening Complete Complete  PCP or Specialist Appt within 3-5 Days Complete   HRI or Home Care Consult Complete   Social Work Consult for Recovery Care Planning/Counseling Complete   Palliative Care Screening Not Applicable   Medication Review Oceanographer) Complete Complete  SW Recovery Care/Counseling Consult   Complete  Skilled Nursing Facility  Complete

## 2023-01-17 NOTE — Consult Note (Signed)
   [facility] CM Inpatient Consult Follow up note   01/17/2023  Matthew [name] 10/04/1930 [id number]  [facility] [[facility]]  Accountable Care Organization [ACO] Patient: Medicare ACO REACH  Primary Care Provider:  Sherron Monday, MD  Patient is for a skilled nursing facility level of care for post hospital transition.Currently, for [facility]  If the patient goes to a [facility] affiliated facility then, patient can be followed by [facility] RN with traditional Medicare and approved Medicare Advantage plans.    Plan:   Will notify the [facility] RN can follow for any known or needs for transitional care needs for returning to post facility care coordination needs to return to community.  For questions or referrals, please contact:   Charlesetta Shanks, RN BSN CCM [facility]  [phone] business mobile phone Toll free office [phone]  Fax number: [phone] [email or web address]

## 2023-01-19 DIAGNOSIS — N186 End stage renal disease: Secondary | ICD-10-CM | POA: Diagnosis not present

## 2023-01-19 DIAGNOSIS — I5022 Chronic systolic (congestive) heart failure: Secondary | ICD-10-CM | POA: Diagnosis not present

## 2023-01-19 DIAGNOSIS — D638 Anemia in other chronic diseases classified elsewhere: Secondary | ICD-10-CM | POA: Diagnosis not present

## 2023-01-19 DIAGNOSIS — K5900 Constipation, unspecified: Secondary | ICD-10-CM | POA: Diagnosis not present

## 2023-01-19 DIAGNOSIS — G4700 Insomnia, unspecified: Secondary | ICD-10-CM | POA: Diagnosis not present

## 2023-01-19 DIAGNOSIS — I2510 Atherosclerotic heart disease of native coronary artery without angina pectoris: Secondary | ICD-10-CM | POA: Diagnosis not present

## 2023-01-19 DIAGNOSIS — I480 Paroxysmal atrial fibrillation: Secondary | ICD-10-CM | POA: Diagnosis not present

## 2023-01-19 DIAGNOSIS — L89152 Pressure ulcer of sacral region, stage 2: Secondary | ICD-10-CM | POA: Diagnosis not present

## 2023-01-19 DIAGNOSIS — K219 Gastro-esophageal reflux disease without esophagitis: Secondary | ICD-10-CM | POA: Diagnosis not present

## 2023-01-19 DIAGNOSIS — Z992 Dependence on renal dialysis: Secondary | ICD-10-CM | POA: Diagnosis not present

## 2023-01-19 DIAGNOSIS — I959 Hypotension, unspecified: Secondary | ICD-10-CM | POA: Diagnosis not present

## 2023-01-19 DIAGNOSIS — E039 Hypothyroidism, unspecified: Secondary | ICD-10-CM | POA: Diagnosis not present

## 2023-01-20 DIAGNOSIS — D509 Iron deficiency anemia, unspecified: Secondary | ICD-10-CM | POA: Diagnosis not present

## 2023-01-20 DIAGNOSIS — Z992 Dependence on renal dialysis: Secondary | ICD-10-CM | POA: Diagnosis not present

## 2023-01-20 DIAGNOSIS — N250 Renal osteodystrophy: Secondary | ICD-10-CM | POA: Diagnosis not present

## 2023-01-20 DIAGNOSIS — N186 End stage renal disease: Secondary | ICD-10-CM | POA: Diagnosis not present

## 2023-01-20 DIAGNOSIS — D631 Anemia in chronic kidney disease: Secondary | ICD-10-CM | POA: Diagnosis not present

## 2023-01-21 ENCOUNTER — Observation Stay: Payer: Medicare Other

## 2023-01-21 ENCOUNTER — Inpatient Hospital Stay
Admission: EM | Admit: 2023-01-21 | Discharge: 2023-01-26 | DRG: 270 | Disposition: A | Discharge status: Skilled Nursing Facility | Payer: Medicare Other | Attending: Internal Medicine | Admitting: Internal Medicine

## 2023-01-21 ENCOUNTER — Other Ambulatory Visit: Payer: Self-pay

## 2023-01-21 DIAGNOSIS — Z9841 Cataract extraction status, right eye: Secondary | ICD-10-CM

## 2023-01-21 DIAGNOSIS — I120 Hypertensive chronic kidney disease with stage 5 chronic kidney disease or end stage renal disease: Secondary | ICD-10-CM

## 2023-01-21 DIAGNOSIS — Z7902 Long term (current) use of antithrombotics/antiplatelets: Secondary | ICD-10-CM | POA: Diagnosis not present

## 2023-01-21 DIAGNOSIS — E1122 Type 2 diabetes mellitus with diabetic chronic kidney disease: Secondary | ICD-10-CM | POA: Diagnosis present

## 2023-01-21 DIAGNOSIS — I5022 Chronic systolic (congestive) heart failure: Secondary | ICD-10-CM | POA: Diagnosis present

## 2023-01-21 DIAGNOSIS — Z7982 Long term (current) use of aspirin: Secondary | ICD-10-CM | POA: Diagnosis not present

## 2023-01-21 DIAGNOSIS — I951 Orthostatic hypotension: Secondary | ICD-10-CM | POA: Diagnosis not present

## 2023-01-21 DIAGNOSIS — T82898A Other specified complication of vascular prosthetic devices, implants and grafts, initial encounter: Secondary | ICD-10-CM | POA: Diagnosis present

## 2023-01-21 DIAGNOSIS — Z96641 Presence of right artificial hip joint: Secondary | ICD-10-CM | POA: Diagnosis not present

## 2023-01-21 DIAGNOSIS — K219 Gastro-esophageal reflux disease without esophagitis: Secondary | ICD-10-CM | POA: Diagnosis present

## 2023-01-21 DIAGNOSIS — I252 Old myocardial infarction: Secondary | ICD-10-CM

## 2023-01-21 DIAGNOSIS — Z8249 Family history of ischemic heart disease and other diseases of the circulatory system: Secondary | ICD-10-CM | POA: Diagnosis not present

## 2023-01-21 DIAGNOSIS — F1090 Alcohol use, unspecified, uncomplicated: Secondary | ICD-10-CM | POA: Diagnosis not present

## 2023-01-21 DIAGNOSIS — Z992 Dependence on renal dialysis: Secondary | ICD-10-CM | POA: Diagnosis not present

## 2023-01-21 DIAGNOSIS — N186 End stage renal disease: Secondary | ICD-10-CM | POA: Diagnosis present

## 2023-01-21 DIAGNOSIS — J309 Allergic rhinitis, unspecified: Secondary | ICD-10-CM | POA: Diagnosis not present

## 2023-01-21 DIAGNOSIS — I130 Hypertensive heart and chronic kidney disease with heart failure and stage 1 through stage 4 chronic kidney disease, or unspecified chronic kidney disease: Secondary | ICD-10-CM | POA: Diagnosis not present

## 2023-01-21 DIAGNOSIS — Z87891 Personal history of nicotine dependence: Secondary | ICD-10-CM

## 2023-01-21 DIAGNOSIS — Z959 Presence of cardiac and vascular implant and graft, unspecified: Secondary | ICD-10-CM | POA: Diagnosis not present

## 2023-01-21 DIAGNOSIS — Z91158 Patient's noncompliance with renal dialysis for other reason: Secondary | ICD-10-CM | POA: Diagnosis not present

## 2023-01-21 DIAGNOSIS — S72141G Displaced intertrochanteric fracture of right femur, subsequent encounter for closed fracture with delayed healing: Secondary | ICD-10-CM | POA: Diagnosis not present

## 2023-01-21 DIAGNOSIS — Z79890 Hormone replacement therapy (postmenopausal): Secondary | ICD-10-CM

## 2023-01-21 DIAGNOSIS — E039 Hypothyroidism, unspecified: Secondary | ICD-10-CM | POA: Diagnosis not present

## 2023-01-21 DIAGNOSIS — I480 Paroxysmal atrial fibrillation: Secondary | ICD-10-CM | POA: Diagnosis present

## 2023-01-21 DIAGNOSIS — Y832 Surgical operation with anastomosis, bypass or graft as the cause of abnormal reaction of the patient, or of later complication, without mention of misadventure at the time of the procedure: Secondary | ICD-10-CM | POA: Diagnosis present

## 2023-01-21 DIAGNOSIS — T82868D Thrombosis of vascular prosthetic devices, implants and grafts, subsequent encounter: Secondary | ICD-10-CM | POA: Diagnosis not present

## 2023-01-21 DIAGNOSIS — E785 Hyperlipidemia, unspecified: Secondary | ICD-10-CM | POA: Diagnosis not present

## 2023-01-21 DIAGNOSIS — E119 Type 2 diabetes mellitus without complications: Secondary | ICD-10-CM

## 2023-01-21 DIAGNOSIS — K5900 Constipation, unspecified: Secondary | ICD-10-CM | POA: Diagnosis not present

## 2023-01-21 DIAGNOSIS — T82590D Other mechanical complication of surgically created arteriovenous fistula, subsequent encounter: Secondary | ICD-10-CM | POA: Diagnosis not present

## 2023-01-21 DIAGNOSIS — Z23 Encounter for immunization: Secondary | ICD-10-CM | POA: Diagnosis not present

## 2023-01-21 DIAGNOSIS — I2510 Atherosclerotic heart disease of native coronary artery without angina pectoris: Secondary | ICD-10-CM | POA: Diagnosis not present

## 2023-01-21 DIAGNOSIS — I959 Hypotension, unspecified: Secondary | ICD-10-CM | POA: Diagnosis not present

## 2023-01-21 DIAGNOSIS — E559 Vitamin D deficiency, unspecified: Secondary | ICD-10-CM | POA: Diagnosis not present

## 2023-01-21 DIAGNOSIS — Z7901 Long term (current) use of anticoagulants: Secondary | ICD-10-CM | POA: Diagnosis not present

## 2023-01-21 DIAGNOSIS — Z79899 Other long term (current) drug therapy: Secondary | ICD-10-CM

## 2023-01-21 DIAGNOSIS — Z741 Need for assistance with personal care: Secondary | ICD-10-CM | POA: Diagnosis not present

## 2023-01-21 DIAGNOSIS — I132 Hypertensive heart and chronic kidney disease with heart failure and with stage 5 chronic kidney disease, or end stage renal disease: Secondary | ICD-10-CM | POA: Diagnosis present

## 2023-01-21 DIAGNOSIS — T82868A Thrombosis of vascular prosthetic devices, implants and grafts, initial encounter: Secondary | ICD-10-CM | POA: Diagnosis not present

## 2023-01-21 DIAGNOSIS — Z9181 History of falling: Secondary | ICD-10-CM | POA: Diagnosis not present

## 2023-01-21 DIAGNOSIS — I482 Chronic atrial fibrillation, unspecified: Secondary | ICD-10-CM | POA: Diagnosis present

## 2023-01-21 DIAGNOSIS — D638 Anemia in other chronic diseases classified elsewhere: Secondary | ICD-10-CM | POA: Diagnosis not present

## 2023-01-21 DIAGNOSIS — E8809 Other disorders of plasma-protein metabolism, not elsewhere classified: Secondary | ICD-10-CM | POA: Diagnosis not present

## 2023-01-21 DIAGNOSIS — D631 Anemia in chronic kidney disease: Secondary | ICD-10-CM | POA: Diagnosis present

## 2023-01-21 DIAGNOSIS — L89152 Pressure ulcer of sacral region, stage 2: Secondary | ICD-10-CM | POA: Diagnosis not present

## 2023-01-21 DIAGNOSIS — Z951 Presence of aortocoronary bypass graft: Secondary | ICD-10-CM | POA: Diagnosis not present

## 2023-01-21 DIAGNOSIS — Z66 Do not resuscitate: Secondary | ICD-10-CM | POA: Diagnosis present

## 2023-01-21 DIAGNOSIS — Z9842 Cataract extraction status, left eye: Secondary | ICD-10-CM

## 2023-01-21 DIAGNOSIS — N2581 Secondary hyperparathyroidism of renal origin: Secondary | ICD-10-CM | POA: Diagnosis present

## 2023-01-21 DIAGNOSIS — Z880 Allergy status to penicillin: Secondary | ICD-10-CM

## 2023-01-21 DIAGNOSIS — G4700 Insomnia, unspecified: Secondary | ICD-10-CM | POA: Diagnosis not present

## 2023-01-21 DIAGNOSIS — M810 Age-related osteoporosis without current pathological fracture: Secondary | ICD-10-CM | POA: Diagnosis not present

## 2023-01-21 DIAGNOSIS — T82590A Other mechanical complication of surgically created arteriovenous fistula, initial encounter: Secondary | ICD-10-CM | POA: Diagnosis not present

## 2023-01-21 DIAGNOSIS — R600 Localized edema: Secondary | ICD-10-CM | POA: Diagnosis not present

## 2023-01-21 DIAGNOSIS — Z85828 Personal history of other malignant neoplasm of skin: Secondary | ICD-10-CM | POA: Diagnosis not present

## 2023-01-21 DIAGNOSIS — Z961 Presence of intraocular lens: Secondary | ICD-10-CM | POA: Diagnosis present

## 2023-01-21 DIAGNOSIS — M1990 Unspecified osteoarthritis, unspecified site: Secondary | ICD-10-CM | POA: Diagnosis not present

## 2023-01-21 DIAGNOSIS — T82858A Stenosis of vascular prosthetic devices, implants and grafts, initial encounter: Secondary | ICD-10-CM | POA: Diagnosis not present

## 2023-01-21 LAB — COMPREHENSIVE METABOLIC PANEL
ALT: <5 U/L (ref 0–44)
AST: 21 U/L (ref 15–41)
Albumin: 2.7 g/dL — ABNORMAL LOW (ref 3.5–5.0)
Alkaline Phosphatase: 145 U/L — ABNORMAL HIGH (ref 38–126)
Anion gap: 8 (ref 5–15)
BUN: 58 mg/dL — ABNORMAL HIGH (ref 8–23)
CO2: 29 mmol/L (ref 22–32)
Calcium: 8.3 mg/dL — ABNORMAL LOW (ref 8.9–10.3)
Chloride: 98 mmol/L (ref 98–111)
Creatinine, Ser: 3.9 mg/dL — ABNORMAL HIGH (ref 0.61–1.24)
GFR, Estimated: 14 mL/min — ABNORMAL LOW (ref >60)
Glucose, Bld: 172 mg/dL — ABNORMAL HIGH (ref 70–99)
Potassium: 3.8 mmol/L (ref 3.5–5.1)
Sodium: 135 mmol/L (ref 135–145)
Total Bilirubin: 0.9 mg/dL (ref 0.3–1.2)
Total Protein: 6.2 g/dL — ABNORMAL LOW (ref 6.5–8.1)

## 2023-01-21 LAB — CBC WITH DIFFERENTIAL/PLATELET
Abs Immature Granulocytes: 0.09 10*3/uL — ABNORMAL HIGH (ref 0.00–0.07)
Basophils Absolute: 0 10*3/uL (ref 0.0–0.1)
Basophils Relative: 0 %
Eosinophils Absolute: 0.2 10*3/uL (ref 0.0–0.5)
Eosinophils Relative: 2 %
HCT: 26.8 % — ABNORMAL LOW (ref 39.0–52.0)
Hemoglobin: 8.4 g/dL — ABNORMAL LOW (ref 13.0–17.0)
Immature Granulocytes: 1 %
Lymphocytes Relative: 5 %
Lymphs Abs: 0.5 10*3/uL — ABNORMAL LOW (ref 0.7–4.0)
MCH: 31.1 pg (ref 26.0–34.0)
MCHC: 31.3 g/dL (ref 30.0–36.0)
MCV: 99.3 fL (ref 80.0–100.0)
Monocytes Absolute: 1.4 10*3/uL — ABNORMAL HIGH (ref 0.1–1.0)
Monocytes Relative: 14 %
Neutro Abs: 8 10*3/uL — ABNORMAL HIGH (ref 1.7–7.7)
Neutrophils Relative %: 78 %
Platelets: 173 10*3/uL (ref 150–400)
RBC: 2.7 MIL/uL — ABNORMAL LOW (ref 4.22–5.81)
RDW: 16.2 % — ABNORMAL HIGH (ref 11.5–15.5)
WBC: 10.3 10*3/uL (ref 4.0–10.5)
nRBC: 0 % (ref 0.0–0.2)

## 2023-01-21 LAB — GLUCOSE, CAPILLARY: Glucose-Capillary: 136 mg/dL — ABNORMAL HIGH (ref 70–99)

## 2023-01-21 MED ORDER — FLUTICASONE PROPIONATE 50 MCG/ACT NA SUSP: 2.0000 | Freq: Every day | NASAL | Status: DC | PRN

## 2023-01-21 MED ORDER — MAGNESIUM HYDROXIDE 400 MG/5ML PO SUSP: 30.0000 mL | Freq: Every day | ORAL | Status: DC | PRN

## 2023-01-21 MED ORDER — VITAMIN D 25 MCG (1000 UNIT) PO TABS
1000.0000 [IU] | ORAL_TABLET | Freq: Every day | ORAL | Status: DC
  Administered 2023-01-22 – 2023-01-26 (×3): 1000 [IU] via ORAL
  Filled 2023-01-21 (×3): qty 1

## 2023-01-21 MED ORDER — ONDANSETRON HCL 4 MG/2ML IJ SOLN: 4.0000 mg | Freq: Four times a day (QID) | INTRAMUSCULAR | Status: DC | PRN

## 2023-01-21 MED ORDER — FERROUS SULFATE 325 (65 FE) MG PO TABS
325.0000 mg | ORAL_TABLET | Freq: Two times a day (BID) | ORAL | Status: DC
  Administered 2023-01-22 – 2023-01-26 (×8): 325 mg via ORAL
  Filled 2023-01-21 (×8): qty 1

## 2023-01-21 MED ORDER — LIDOCAINE-PRILOCAINE 2.5-2.5 % EX CREA: 1.0000 | TOPICAL_CREAM | CUTANEOUS | Status: DC | PRN

## 2023-01-21 MED ORDER — HEPARIN SODIUM (PORCINE) 5000 UNIT/ML IJ SOLN
5000.0000 [IU] | Freq: Three times a day (TID) | INTRAMUSCULAR | Status: DC
  Administered 2023-01-21 – 2023-01-26 (×13): 5000 [IU] via SUBCUTANEOUS
  Filled 2023-01-21 (×15): qty 1

## 2023-01-21 MED ORDER — ROSUVASTATIN CALCIUM 10 MG PO TABS
40.0000 mg | ORAL_TABLET | Freq: Every day | ORAL | Status: DC
  Administered 2023-01-21 – 2023-01-26 (×5): 40 mg via ORAL
  Filled 2023-01-21 (×7): qty 4

## 2023-01-21 MED ORDER — LIDOCAINE HCL (PF) 1 % IJ SOLN
10.0000 mL | Freq: Once | INTRAMUSCULAR | Status: DC
  Filled 2023-01-21: qty 10

## 2023-01-21 MED ORDER — NITROGLYCERIN 0.4 MG SL SUBL: 0.4000 mg | SUBLINGUAL_TABLET | SUBLINGUAL | Status: DC | PRN

## 2023-01-21 MED ORDER — INSULIN ASPART 100 UNIT/ML IJ SOLN
0.0000 [IU] | Freq: Every day | INTRAMUSCULAR | Status: DC
  Filled 2023-01-21: qty 1

## 2023-01-21 MED ORDER — CLOPIDOGREL BISULFATE 75 MG PO TABS
75.0000 mg | ORAL_TABLET | Freq: Every day | ORAL | Status: DC
  Administered 2023-01-22 – 2023-01-26 (×5): 75 mg via ORAL
  Filled 2023-01-21 (×5): qty 1

## 2023-01-21 MED ORDER — ONDANSETRON HCL 4 MG PO TABS: 4.0000 mg | ORAL_TABLET | Freq: Four times a day (QID) | ORAL | Status: DC | PRN

## 2023-01-21 MED ORDER — RENA-VITE PO TABS
1.0000 | ORAL_TABLET | Freq: Every day | ORAL | Status: DC
  Administered 2023-01-22 – 2023-01-26 (×5): 1 via ORAL
  Filled 2023-01-21 (×5): qty 1

## 2023-01-21 MED ORDER — ACETAMINOPHEN 325 MG PO TABS
650.0000 mg | ORAL_TABLET | Freq: Four times a day (QID) | ORAL | Status: DC | PRN
  Filled 2023-01-21: qty 2

## 2023-01-21 MED ORDER — MIDODRINE HCL 5 MG PO TABS
5.0000 mg | ORAL_TABLET | Freq: Three times a day (TID) | ORAL | Status: DC
  Administered 2023-01-22 – 2023-01-26 (×14): 5 mg via ORAL
  Filled 2023-01-21 (×13): qty 1

## 2023-01-21 MED ORDER — HYDROCODONE-ACETAMINOPHEN 5-325 MG PO TABS
1.0000 | ORAL_TABLET | ORAL | Status: DC | PRN
  Administered 2023-01-21 – 2023-01-24 (×7): 1 via ORAL
  Filled 2023-01-21 (×9): qty 1

## 2023-01-21 MED ORDER — HEPARIN SOD (PORK) LOCK FLUSH 100 UNIT/ML IV SOLN: 500.0000 [IU] | Freq: Once | INTRAVENOUS | Status: AC

## 2023-01-21 MED ORDER — SUVOREXANT 10 MG PO TABS: 10.0000 mg | ORAL_TABLET | Freq: Every day | ORAL | Status: DC

## 2023-01-21 MED ORDER — INSULIN ASPART 100 UNIT/ML IJ SOLN
0.0000 [IU] | Freq: Three times a day (TID) | INTRAMUSCULAR | Status: DC
  Administered 2023-01-22 – 2023-01-23 (×4): 2 [IU] via SUBCUTANEOUS
  Administered 2023-01-23 – 2023-01-24 (×4): 1 [IU] via SUBCUTANEOUS
  Filled 2023-01-21 (×8): qty 1

## 2023-01-21 MED ORDER — LORATADINE 10 MG PO TABS
10.0000 mg | ORAL_TABLET | Freq: Every day | ORAL | Status: DC
  Administered 2023-01-22 – 2023-01-26 (×5): 10 mg via ORAL
  Filled 2023-01-21 (×5): qty 1

## 2023-01-21 MED ORDER — HEPARIN SOD (PORK) LOCK FLUSH 100 UNIT/ML IV SOLN
INTRAVENOUS | Status: AC
  Administered 2023-01-21: 500 [IU] via INTRAVENOUS
  Filled 2023-01-21: qty 15

## 2023-01-21 MED ORDER — TRAZODONE HCL 50 MG PO TABS: 25.0000 mg | ORAL_TABLET | Freq: Every evening | ORAL | Status: DC | PRN

## 2023-01-21 MED ORDER — ACETAMINOPHEN 650 MG RE SUPP: 650.0000 mg | Freq: Four times a day (QID) | RECTAL | Status: DC | PRN

## 2023-01-21 MED ORDER — OMEGA-3-ACID ETHYL ESTERS 1 G PO CAPS
1.0000 g | ORAL_CAPSULE | Freq: Every day | ORAL | Status: DC
  Administered 2023-01-22 – 2023-01-26 (×5): 1 g via ORAL
  Filled 2023-01-21 (×5): qty 1

## 2023-01-21 NOTE — ED Notes (Signed)
Placed in recliner for comfort .

## 2023-01-21 NOTE — Assessment & Plan Note (Addendum)
-   We will continue statin therapy and Lovaza. 

## 2023-01-21 NOTE — H&P (Signed)
[place]   PATIENT NAME: Matthew [name]    MR#:  [id number]  DATE OF BIRTH:  February 13, 1931  DATE OF ADMISSION:  01/21/2023  PRIMARY CARE PHYSICIAN: Sherron Monday, MD   Patient is coming from: Home  REQUESTING/REFERRING PHYSICIAN: [name], MD  CHIEF COMPLAINT:   Chief Complaint  Patient presents with   Vascular Access Problem    HISTORY OF PRESENT ILLNESS:  Matthew [name] is a 89 y.o. Caucasian male with medical history significant for HFrEF, ESRD on HD, AAA, dyslipidemia, hypertension, and type diabetes mellitus, who presented to the emergency room with acute onset of left arm AV fistula occlusion.  The patient missed hemodialysis on Wednesday and had a session yesterday.  He was going for another session today that could not be performed due to his left AVF occlusion.  No chest pain or palpitations.  No nausea or vomiting or abdominal pain.  No cough or wheezing or dyspnea.  No dysuria, hematuria or urinary frequency or urgency or flank pain.  ED Course: Upon presenting to the emergency room, temperature was 99.1, BP was 100/47 with heart rate of 104 with otherwise normal vital signs.  Labs revealed a BUN of 58 and creatinine 3.9 with calcium 8.3, alk phos 145, albumin 2.7, total protein 6.2 and CBC showed hemoglobin 8.4 hematocrit 26.8 close to previous levels. EKG as reviewed by me : None Imaging: None.  Vascular surgery was contacted about the patient and Dr. Gilda Crease and recommended having a temporary catheter.  I discussed the case with Dr. Wynelle Link as well.  The patient will be placed in observation and medical bed for further evaluation and management. PAST MEDICAL HISTORY:   Past Medical History:  Diagnosis Date   Anginal pain (HCC)    Arthritis    Ascending aortic aneurysm (HCC) 07/18/2015   a.) TTE 07/18/2015: severe dilation of ascending aorta measuring 7.0 cm. b.) TTE 01/10/2018: aortic root 3.8 cm; ascending aorta 6.8 cm; refused surgical  intervention/repair.   Atrial fibrillation (HCC)    a.) CHA2DS2-VASc = 6 (age x 2, HFrEF, HTN, previous MI, T2DM). b.) rate/rhythm maintained on amiodarone + metoprolol succinate; chronic antiplatelet therapy using clopidogrel   Bradycardia    Coronary artery disease    ESRD (end stage renal disease) (HCC)    GERD (gastroesophageal reflux disease)    HFrEF (heart failure with reduced ejection fraction) (HCC)    a.) TTE 12/03/2013: mod dec LV function; EF 35-40%; severe inferolateral and inferior HK; LA dilated; G3DD; PASP 52 mmHg. b.) TTE 07/18/2015: mild LV dysfunction with mild LVH; EF 45%; mild BAE. c.) TTE 01/10/2018: mod LV dysfunction; EF 45%; diffuse HK   HLD (hyperlipidemia)    Hypertension    IDA (iron deficiency anemia)    Long term current use of antithrombotics/antiplatelets    a.) clopidogrel   Myocardial infarction (HCC) 2000   Pneumonia    S/P CABG x 4 2001   a.) LIMA-LAD, SVG-D1, SVG-OM1, SVG-OM2, SVG-PDA   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   left frontal scalp, EDC   Squamous cell carcinoma of skin 05/05/2020   left temporal scalp, in situ, EDC 05/13/20   T2DM (type 2 diabetes mellitus) (HCC)    Valvular insufficiency 12/03/2013   a.) TTE 12/03/2013: EF 35-40%; mild AR/MR, b.) TTE 07/18/2015: EF 45%; mild AR/PR, mod TR, severe MR. c.) TTE 01/10/2018: mod AR/TR    PAST SURGICAL HISTORY:   Past Surgical History:  Procedure Laterality Date  A/V SHUNTOGRAM Left 04/01/2022   Procedure: A/V SHUNTOGRAM;  Surgeon: Annice Needy, MD;  Location: [facility];  Service: Cardiovascular;  Laterality: Left;   AV FISTULA PLACEMENT Left 04/15/2021   Procedure: INSERTION OF ARTERIOVENOUS (AV) GORE-TEX GRAFT ARM ( BRACHIAL AXILLARY);  Surgeon: Annice Needy, MD;  Location: [facility];  Service: Vascular;  Laterality: Left;   CATARACT EXTRACTION W/PHACO Left 07/26/2017   Procedure: CATARACT EXTRACTION PHACO AND INTRAOCULAR LENS PLACEMENT (IOC);  Surgeon: Galen Manila, MD;  Location: [facility];  Service: Ophthalmology;  Laterality: Left;  [place]   00:45.8 AP%  13.1 CDE  6.00 Fluid Pack Lot # [id number]   CATARACT EXTRACTION W/PHACO Right 08/17/2017   Procedure: CATARACT EXTRACTION PHACO AND INTRAOCULAR LENS PLACEMENT (IOC);  Surgeon: Galen Manila, MD;  Location: [facility];  Service: Ophthalmology;  Laterality: Right;  [place]  01:30 AP% 16.8 CDE 15.24 Fluid pack lot # [id number]   CHOLECYSTECTOMY     CORONARY ARTERY BYPASS GRAFT N/A 2001   Procedure: 5v CABG (LIMA-LAD, SVG-D1, SVG-OM1, SVG-OM2, SVG-PDA)   DIALYSIS/PERMA CATHETER INSERTION N/A 02/09/2018   Procedure: DIALYSIS/PERMA CATHETER INSERTION;  Surgeon: Annice Needy, MD;  Location: [facility];  Service: Cardiovascular;  Laterality: N/A;   DIALYSIS/PERMA CATHETER REMOVAL N/A 06/25/2021   Procedure: DIALYSIS/PERMA CATHETER REMOVAL;  Surgeon: Annice Needy, MD;  Location: [facility];  Service: Cardiovascular;  Laterality: N/A;   FRACTURE SURGERY Left 2015   LEFT arm   INSERTION OF DIALYSIS CATHETER Right 11/03/2020   Procedure: INSERTION OF Perm Cath in the RIGHT INTERNAL JUGULAR;  Surgeon: Annice Needy, MD;  Location: [facility];  Service: Vascular;  Laterality: Right;   INTRAMEDULLARY (IM) NAIL INTERTROCHANTERIC Right 11/12/2022   Procedure: INTRAMEDULLARY (IM) NAIL INTERTROCHANTERIC;  Surgeon: Juanell Fairly, MD;  Location: [facility];  Service: Orthopedics;  Laterality: Right;   LEFT HEART CATHETERIZATION WITH CORONARY/GRAFT ANGIOGRAM Left 12/04/2013   Procedure: LEFT HEART CATHETERIZATION WITH Isabel Caprice;  Surgeon: Marykay Lex, MD;  Location: [facility] CATH LAB;  Service: Cardiovascular;  Laterality: Left;   LEFT HEART CATHETERIZATION WITH CORONARY/GRAFT ANGIOGRAM Left 02/06/2009   Procedure: LEFT HEART CATHETERIZATION WITH CORONARY/GRAFT ANGIOGRAM; Location: [facility]; Surgeon: Despina Hick, MD   PERCUTANEOUS CORONARY STENT INTERVENTION (PCI-S) N/A 12/06/2013   Procedure:  STAGED PERCUTANEOUS CORONARY STENT INTERVENTION (overlapping 4.0 x 38 mm (mid) and 4.0 x 28 mm (ostial) Promus Primier DES to SVG-RPDA graft);  Surgeon: Lesleigh Noe, MD;  Location: [facility] CATH LAB;  Service: Cardiovascular   REMOVAL OF A DIALYSIS CATHETER N/A 11/03/2020   Procedure: REMOVAL OF A DIALYSIS CATHETER;  Surgeon: Annice Needy, MD;  Location: [facility];  Service: Vascular;  Laterality: N/A;   TOTAL HIP REVISION Right 01/12/2023   Procedure: CONVERSION TO  RIGHT TOTAL HIP REPLACEMENT;  Surgeon: Joen Laura, MD;  Location: [facility] OR;  Service: Orthopedics;  Laterality: Right;    SOCIAL HISTORY:   Social History   Tobacco Use   Smoking status: Former    Current packs/day: 0.00    Types: Pipe, Cigarettes    Quit date: 1992    Years since quitting: 32.6   Smokeless tobacco: Never  Substance Use Topics   Alcohol use: Not Currently    Alcohol/week: 1.0 standard drink of alcohol    Types: 1 Cans of beer per week    Comment: occasionally drinking only,once a month    FAMILY HISTORY:   Family History  Problem Relation Age of Onset  Heart attack Father 17       died first MI at age 51   Heart failure Mother     DRUG ALLERGIES:   Allergies  Allergen Reactions   Other     Other reaction(s): Myalgias (intolerance), Other (See Comments) Statin Drugs  Statin Drugs     Amoxicillin Diarrhea    REVIEW OF SYSTEMS:   ROS As per history of present illness. All pertinent systems were reviewed above. Constitutional, HEENT, cardiovascular, respiratory, GI, GU, musculoskeletal, neuro, psychiatric, endocrine, integumentary and hematologic systems were reviewed and are otherwise negative/unremarkable except for positive findings mentioned above in the HPI.   MEDICATIONS AT HOME:   Prior to Admission medications   Medication Sig Start Date End Date Taking? Authorizing Provider  cefadroxil (DURICEF) 500 MG capsule Take 1 capsule (500 mg total) by mouth 2 (two) times daily  for 7 days. 01/14/23 01/21/23  Cecil Cobbs, PA-C  cetirizine (ZYRTEC) 10 MG tablet Take 10 mg by mouth daily.    [provider]  cholecalciferol (VITAMIN D) 25 MCG (1000 UNIT) tablet Take 1,000 Units by mouth daily with breakfast.    [provider]  clopidogrel (PLAVIX) 75 MG tablet Take 1 tablet (75 mg total) by mouth daily. 12/02/22   Laurier Nancy, MD  ferrous sulfate 325 (65 FE) MG tablet Take 325 mg by mouth 2 (two) times daily with a meal.     [provider]  fluticasone (FLONASE) 50 MCG/ACT nasal spray Place 2 sprays into both nostrils daily as needed for allergies or rhinitis.    [provider]  HYDROcodone-acetaminophen (NORCO/VICODIN) 5-325 MG tablet Take 1 tablet by mouth every 4 (four) hours as needed for up to 7 days for moderate pain. 01/14/23 01/21/23  Cecil Cobbs, PA-C  levothyroxine (SYNTHROID) 88 MCG tablet Take 88 mcg by mouth daily before breakfast.    [provider]  lidocaine-prilocaine (EMLA) cream Apply 1 Application topically as needed (before dialysis).    [provider]  midodrine (PROAMATINE) 5 MG tablet Take 1 tablet (5 mg total) by mouth 3 (three) times daily with meals. Patient taking differently: Take 5 mg by mouth. 5MG  1 HOUR BEFORE DIALYSIS 02/17/21   Delfino Lovett, MD  multivitamin (RENA-VIT) TABS tablet Take 1 tablet by mouth daily.    [provider]  nitroGLYCERIN (NITROSTAT) 0.4 MG SL tablet Place 0.4 mg under the tongue every 5 (five) minutes as needed for chest pain.    [provider]  Omega-3 Fatty Acids (FISH OIL) 1000 MG CAPS Take 4,000 mg by mouth daily with lunch.    [provider]  polyethylene glycol (MIRALAX / GLYCOLAX) 17 g packet Take 17 g by mouth daily. 01/06/21   Enedina Finner, MD  rosuvastatin (CRESTOR) 40 MG tablet TAKE 1 TABLET BY MOUTH ONCE  DAILY 10/28/22   Laurier Nancy, MD  senna-docusate (SENOKOT-S) 8.6-50 MG tablet Take 2 tablets by mouth 2  (two) times daily. 01/05/21   Enedina Finner, MD  Suvorexant (BELSOMRA) 10 MG TABS Take 10 mg by mouth at bedtime.    [provider]      VITAL SIGNS:  Blood pressure 119/67, pulse 75, temperature 98.3 F (36.8 C), temperature source Oral, resp. rate 16, weight 95.5 kg, SpO2 100%.  PHYSICAL EXAMINATION:  Physical Exam  GENERAL:  89 y.o.-year-old patient lying in the bed with no acute distress.  EYES: Pupils equal, round, reactive to light and accommodation. No scleral icterus. Extraocular muscles intact.  HEENT: Head atraumatic, normocephalic. Oropharynx and nasopharynx clear.  NECK:  Supple, no jugular venous distention. No thyroid enlargement, no tenderness.  LUNGS: Normal breath sounds bilaterally, no wheezing, rales,rhonchi or crepitation. No use of accessory muscles of respiration.  CARDIOVASCULAR: Regular rate and rhythm, S1, S2 normal. No murmurs, rubs, or gallops.  ABDOMEN: Soft, nondistended, nontender. Bowel sounds present. No organomegaly or mass.  EXTREMITIES: No pedal edema, cyanosis, or clubbing.  NEUROLOGIC: Cranial nerves II through XII are intact. Muscle strength 5/5 in all extremities. Sensation intact. Gait not checked.  PSYCHIATRIC: The patient is alert and oriented x 3.  Normal affect and good eye contact. SKIN: No obvious rash, lesion, or ulcer.   LABORATORY PANEL:   CBC Recent Labs  Lab 01/21/23 1400  WBC 10.3  HGB 8.4*  HCT 26.8*  PLT 173   ------------------------------------------------------------------------------------------------------------------  Chemistries  Recent Labs  Lab 01/21/23 1400  NA 135  K 3.8  CL 98  CO2 29  GLUCOSE 172*  BUN 58*  CREATININE 3.90*  CALCIUM 8.3*  AST 21  ALT <5  ALKPHOS 145*  BILITOT 0.9   ------------------------------------------------------------------------------------------------------------------  Cardiac Enzymes No results for input(s): "TROPONINI" in the last 168  hours. ------------------------------------------------------------------------------------------------------------------  RADIOLOGY:  No results found.    IMPRESSION AND PLAN:  Assessment and Plan: * AV fistula occlusion, initial encounter [facility]) - The patient will be admitted to an observation medical bed - The patient has end-stage renal disease on hemodialysis on MWF.  He missed Wednesday and underwent dialysis yesterday. - Vascular surgery consult was obtained. - The patient will have a temporary catheter placed for hemodialysis. - Dr. Gilda Crease was notified about the patient and Dr. Cheree Ditto is covering him. - Dr. Wynelle Link was consulted and is aware about the patient.  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 [facility]) - The patient will placed on supplemental coverage with NovoLog.  Coronary artery disease - We will continue statin therapy and as needed sublingual nitroglycerin as well as Plavix.  Dyslipidemia - We will continue statin therapy and Lovaza.   DVT prophylaxis: SQ heparin. Advanced Care Planning:  Code Status: The patient DNR and DNI Family Communication:  The plan of care was discussed in details with the patient (and family). I answered all questions. The patient agreed to proceed with the above mentioned plan. Further management will depend upon hospital course. Disposition Plan: Back to previous home environment Consults called: Nephrology and vascular surgery All the records are reviewed and case discussed with ED provider.  Status is: Observation  I certify that at the time of admission, it is my clinical judgment that the patient will inpatient hospital care extending less than 2 midnights.                            Dispo: The patient is from: Home              Anticipated d/c is to: Home              Patient currently is not medically stable to d/c.              Difficult to place patient: No  Hannah Beat M.D on 01/21/2023 at 6:04 PM  [facility]   From 7 PM-7 AM, contact night-coverage [email or web address]  CC: Primary care physician; Sherron Monday, MD

## 2023-01-21 NOTE — Consult Note (Signed)
[facility] Vascular Consult Note  MRN : [id number]  Matthew [name] is a 89 y.o. (08-22-1930) male who presents with chief complaint of  Chief Complaint  Patient presents with   Vascular Access Problem  .  History of Present Illness: Patient is admitted to the hospital with occlusion of his left brachio-axillary arteriovenous graft.  It has worked Proofreader for 2.5 years and this is the initial failure.  It was used yesterday but could not be accessed today and has no bruit.  The patient reports years of CKD initially on PD but on HD for the past 3 years.  He had one Permacath on the right before the graft was placed.  The patient is not critically ill.  The nephrology service has decided to perform hemodialysis tomorrow and needs access.  I called his Nephrologist to confirm.  We are asked to place a temporary dialysis catheter for immediate dialysis use (tomorrow).    Current Facility-Administered Medications  Medication Dose Route Frequency Provider Last Rate Last Admin   acetaminophen (TYLENOL) tablet 650 mg  650 mg Oral Q6H PRN Mansy, Jan A, MD       Or   acetaminophen (TYLENOL) suppository 650 mg  650 mg Rectal Q6H PRN Mansy, Vernetta Honey, MD       [START ON 01/22/2023] cholecalciferol (VITAMIN D3) 25 MCG (1000 UNIT) tablet 1,000 Units  1,000 Units Oral Q breakfast Mansy, Jan A, MD       [START ON 01/22/2023] clopidogrel (PLAVIX) tablet 75 mg  75 mg Oral Daily Mansy, Jan A, MD       ferrous sulfate tablet 325 mg  325 mg Oral BID WC Mansy, Jan A, MD       fluticasone (FLONASE) 50 MCG/ACT nasal spray 2 spray  2 spray Each Nare Daily PRN Mansy, Jan A, MD       heparin injection 5,000 Units  5,000 Units Subcutaneous Q8H Mansy, Jan A, MD       HYDROcodone-acetaminophen (NORCO/VICODIN) 5-325 MG per tablet 1 tablet  1 tablet Oral Q4H PRN Mansy, Jan A, MD   1 tablet at 01/21/23 2029   insulin aspart (novoLOG) injection 0-5 Units  0-5 Units Subcutaneous QHS Mansy, Jan A, MD        [START ON 01/22/2023] insulin aspart (novoLOG) injection 0-9 Units  0-9 Units Subcutaneous TID WC Mansy, Jan A, MD       lidocaine (PF) (XYLOCAINE) 1 % injection 10 mL  10 mL Infiltration Once Minna Antis, MD       lidocaine-prilocaine (EMLA) cream 1 Application  1 Application Topical PRN Mansy, Vernetta Honey, MD       [START ON 01/22/2023] loratadine (CLARITIN) tablet 10 mg  10 mg Oral Daily Mansy, Jan A, MD       magnesium hydroxide (MILK OF MAGNESIA) suspension 30 mL  30 mL Oral Daily PRN Mansy, Vernetta Honey, MD       [START ON 01/22/2023] midodrine (PROAMATINE) tablet 5 mg  5 mg Oral TID WC Mansy, Vernetta Honey, MD       [START ON 01/22/2023] multivitamin (RENA-VIT) tablet 1 tablet  1 tablet Oral Daily Mansy, Jan A, MD       nitroGLYCERIN (NITROSTAT) SL tablet 0.4 mg  0.4 mg Sublingual Q5 min PRN Mansy, Jan A, MD       [START ON 01/22/2023] omega-3 acid ethyl esters (LOVAZA) capsule 1 g  1 g Oral Daily Mansy, Vernetta Honey, MD  ondansetron (ZOFRAN) tablet 4 mg  4 mg Oral Q6H PRN Mansy, Jan A, MD       Or   ondansetron [facility]) injection 4 mg  4 mg Intravenous Q6H PRN Mansy, Jan A, MD       rosuvastatin (CRESTOR) tablet 40 mg  40 mg Oral Daily Mansy, Jan A, MD   40 mg at 01/21/23 2028   traZODone (DESYREL) tablet 25 mg  25 mg Oral QHS PRN Mansy, Vernetta Honey, MD        Past Medical History:  Diagnosis Date   Anginal pain (HCC)    Arthritis    Ascending aortic aneurysm (HCC) 07/18/2015   a.) TTE 07/18/2015: severe dilation of ascending aorta measuring 7.0 cm. b.) TTE 01/10/2018: aortic root 3.8 cm; ascending aorta 6.8 cm; refused surgical intervention/repair.   Atrial fibrillation (HCC)    a.) CHA2DS2-VASc = 6 (age x 2, HFrEF, HTN, previous MI, T2DM). b.) rate/rhythm maintained on amiodarone + metoprolol succinate; chronic antiplatelet therapy using clopidogrel   Bradycardia    Coronary artery disease    ESRD (end stage renal disease) (HCC)    GERD (gastroesophageal reflux disease)    HFrEF (heart failure with  reduced ejection fraction) (HCC)    a.) TTE 12/03/2013: mod dec LV function; EF 35-40%; severe inferolateral and inferior HK; LA dilated; G3DD; PASP 52 mmHg. b.) TTE 07/18/2015: mild LV dysfunction with mild LVH; EF 45%; mild BAE. c.) TTE 01/10/2018: mod LV dysfunction; EF 45%; diffuse HK   HLD (hyperlipidemia)    Hypertension    IDA (iron deficiency anemia)    Long term current use of antithrombotics/antiplatelets    a.) clopidogrel   Myocardial infarction (HCC) 2000   Pneumonia    S/P CABG x 4 2001   a.) LIMA-LAD, SVG-D1, SVG-OM1, SVG-OM2, SVG-PDA   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   left frontal scalp, EDC   Squamous cell carcinoma of skin 05/05/2020   left temporal scalp, in situ, EDC 05/13/20   T2DM (type 2 diabetes mellitus) (HCC)    Valvular insufficiency 12/03/2013   a.) TTE 12/03/2013: EF 35-40%; mild AR/MR, b.) TTE 07/18/2015: EF 45%; mild AR/PR, mod TR, severe MR. c.) TTE 01/10/2018: mod AR/TR    Past Surgical History:  Procedure Laterality Date   A/V SHUNTOGRAM Left 04/01/2022   Procedure: A/V SHUNTOGRAM;  Surgeon: Annice Needy, MD;  Location: [facility] INVASIVE CV LAB;  Service: Cardiovascular;  Laterality: Left;   AV FISTULA PLACEMENT Left 04/15/2021   Procedure: INSERTION OF ARTERIOVENOUS (AV) GORE-TEX GRAFT ARM ( BRACHIAL AXILLARY);  Surgeon: Annice Needy, MD;  Location: [facility] ORS;  Service: Vascular;  Laterality: Left;   CATARACT EXTRACTION W/PHACO Left 07/26/2017   Procedure: CATARACT EXTRACTION PHACO AND INTRAOCULAR LENS PLACEMENT (IOC);  Surgeon: Galen Manila, MD;  Location: [facility] ORS;  Service: Ophthalmology;  Laterality: Left;  [place]   00:45.8 AP%  13.1 CDE  6.00 Fluid Pack Lot # [id number]   CATARACT EXTRACTION W/PHACO Right 08/17/2017   Procedure: CATARACT EXTRACTION PHACO AND INTRAOCULAR LENS PLACEMENT (IOC);  Surgeon: Galen Manila, MD;  Location: [facility] ORS;  Service: Ophthalmology;  Laterality: Right;  [place]  01:30 AP% 16.8 CDE 15.24 Fluid pack lot #  [id number]   CHOLECYSTECTOMY     CORONARY ARTERY BYPASS GRAFT N/A 2001   Procedure: 5v CABG (LIMA-LAD, SVG-D1, SVG-OM1, SVG-OM2, SVG-PDA)   DIALYSIS/PERMA CATHETER INSERTION N/A 02/09/2018   Procedure: DIALYSIS/PERMA CATHETER INSERTION;  Surgeon: Annice Needy, MD;  Location: [facility] INVASIVE CV LAB;  Service: Cardiovascular;  Laterality: N/A;   DIALYSIS/PERMA CATHETER REMOVAL N/A 06/25/2021   Procedure: DIALYSIS/PERMA CATHETER REMOVAL;  Surgeon: Annice Needy, MD;  Location: [facility] INVASIVE CV LAB;  Service: Cardiovascular;  Laterality: N/A;   FRACTURE SURGERY Left 2015   LEFT arm   INSERTION OF DIALYSIS CATHETER Right 11/03/2020   Procedure: INSERTION OF Perm Cath in the RIGHT INTERNAL JUGULAR;  Surgeon: Annice Needy, MD;  Location: [facility] ORS;  Service: Vascular;  Laterality: Right;   INTRAMEDULLARY (IM) NAIL INTERTROCHANTERIC Right 11/12/2022   Procedure: INTRAMEDULLARY (IM) NAIL INTERTROCHANTERIC;  Surgeon: Juanell Fairly, MD;  Location: [facility] ORS;  Service: Orthopedics;  Laterality: Right;   LEFT HEART CATHETERIZATION WITH CORONARY/GRAFT ANGIOGRAM Left 12/04/2013   Procedure: LEFT HEART CATHETERIZATION WITH Isabel Caprice;  Surgeon: Marykay Lex, MD;  [facility] CATH LAB;  Service: Cardiovascular;  Laterality: Left;   LEFT HEART CATHETERIZATION WITH CORONARY/GRAFT ANGIOGRAM Left 02/06/2009   Procedure: LEFT HEART CATHETERIZATION WITH CORONARY/GRAFT ANGIOGRAM; Location: [facility]; Surgeon: Despina Hick, MD   PERCUTANEOUS CORONARY STENT INTERVENTION (PCI-S) N/A 12/06/2013   Procedure: STAGED PERCUTANEOUS CORONARY STENT INTERVENTION (overlapping 4.0 x 38 mm (mid) and 4.0 x 28 mm (ostial) Promus Primier DES to SVG-RPDA graft);  Surgeon: Lesleigh Noe, MD;  Location: [facility] CATH LAB;  Service: Cardiovascular   REMOVAL OF A DIALYSIS CATHETER N/A 11/03/2020   Procedure: REMOVAL OF A DIALYSIS CATHETER;  Surgeon: Annice Needy, MD;  Location: [facility] ORS;  Service: Vascular;  Laterality: N/A;   TOTAL HIP  REVISION Right 01/12/2023   Procedure: CONVERSION TO  RIGHT TOTAL HIP REPLACEMENT;  Surgeon: Joen Laura, MD;  Location: [facility] OR;  Service: Orthopedics;  Laterality: Right;    Social History Social History   Tobacco Use   Smoking status: Former    Current packs/day: 0.00    Types: Pipe, Cigarettes    Quit date: 1992    Years since quitting: 32.6   Smokeless tobacco: Never  Vaping Use   Vaping status: Never Used  Substance Use Topics   Alcohol use: Not Currently    Alcohol/week: 1.0 standard drink of alcohol    Types: 1 Cans of beer per week    Comment: occasionally drinking only,once a month   Drug use: No    Family History Family History  Problem Relation Age of Onset   Heart attack Father 16       died first MI at age 26   Heart failure Mother     Allergies  Allergen Reactions   Other     Other reaction(s): Myalgias (intolerance), Other (See Comments) Statin Drugs  Statin Drugs     Amoxicillin Diarrhea     REVIEW OF SYSTEMS (Negative unless checked)  Constitutional: [] Weight loss  [] Fever  [] Chills Cardiac: [] Chest pain   [] Chest pressure   [] Palpitations   [] Shortness of breath when laying flat   [] Shortness of breath at rest   [] Shortness of breath with exertion. Vascular:  [] Pain in legs with walking   [] Pain in legs at rest   [] Pain in legs when laying flat   [] Claudication   [] Pain in feet when walking  [] Pain in feet at rest  [] Pain in feet when laying flat   [] History of DVT   [] Phlebitis   [] Swelling in legs   [] Varicose veins   [] Non-healing ulcers Pulmonary:   [] Uses home oxygen   [] Productive cough   [] Hemoptysis   [] Wheeze  [] COPD   [] Asthma Neurologic:  [] Dizziness  [] Blackouts   []   Seizures   [] History of stroke   [] History of TIA  [] Aphasia   [] Temporary blindness   [] Dysphagia   [] Weakness or numbness in arms   [] Weakness or numbness in legs Musculoskeletal:  [] Arthritis   [] Joint swelling   [] Joint pain   [] Low back pain Hematologic:  [] Easy  bruising  [] Easy bleeding   [] Hypercoagulable state   [] Anemic  [] Hepatitis Gastrointestinal:  [] Blood in stool   [] Vomiting blood  [] Gastroesophageal reflux/heartburn   [] Difficulty swallowing. Genitourinary:  [] Chronic kidney disease   [] Difficult urination  [] Frequent urination  [] Burning with urination   [] Blood in urine Skin:  [] Rashes   [] Ulcers   [] Wounds Psychological:  [] History of anxiety   []  History of major depression.     Physical Examination  Vitals:   01/21/23 1630 01/21/23 1700 01/21/23 1741 01/21/23 2015  BP: (!) 102/46 119/67  (!) 102/59  Pulse: (!) 113 75  83  Resp:    20  Temp:   98.3 F (36.8 C) 97.9 F (36.6 C)  TempSrc:   Oral Oral  SpO2: 97% 100%  98%  Weight:       Body mass index is 27.78 kg/m. Gen: WD/WN Head: [place]/AT, No temporalis wasting Ear/Nose/Throat: Hearing grossly intact, nares w/o erythema or drainage Eyes: Sclera non-icteric, conjunctiva clear Neck: Supple, no nuchal rigidity.  No JVD.  Pulmonary:  Good air movement, clear to auscultation bilaterally.  Cardiac: RRR, normal S1, S2, no Murmurs, rubs or gallops. Vascular: Palpable pulses except left radial pulse.  Right arm AV graft with erythema at arterial end (likely from thrombophlebitis) and no bruit or thrill.  No sign of infection. Gastrointestinal: soft, non-tender/non-distended. No guarding/reflex.  Musculoskeletal: M/S 5/5 throughout.  Extremities without ischemic changes.  No deformity or atrophy.  Edema in the lower extremities bilaterally Neurologic: awake and appropriate. Psychiatric: Difficult to assess due to the severity of patient's illness. Dermatologic: No rashes or ulcers noted.   Lymph : No Cervical, Axillary, or Inguinal lymphadenopathy.     CBC Lab Results  Component Value Date   WBC 10.3 01/21/2023   HGB 8.4 (L) 01/21/2023   HCT 26.8 (L) 01/21/2023   MCV 99.3 01/21/2023   PLT 173 01/21/2023    BMET    Component Value Date/Time   NA 135 01/21/2023 1400    NA 140 09/16/2022 1003   K 3.8 01/21/2023 1400   CL 98 01/21/2023 1400   CO2 29 01/21/2023 1400   GLUCOSE 172 (H) 01/21/2023 1400   BUN 58 (H) 01/21/2023 1400   BUN 50 (H) 09/16/2022 1003   CREATININE 3.90 (H) 01/21/2023 1400   CALCIUM 8.3 (L) 01/21/2023 1400   GFRNONAA 14 (L) 01/21/2023 1400   GFRAA 25 (L) 05/26/2019 0503   Estimated Creatinine Clearance: 14.2 mL/min (A) (by C-G formula based on SCr of 3.9 mg/dL (H)).  COAG Lab Results  Component Value Date   INR 1.1 11/12/2022   INR 1.1 02/01/2022   INR 1.1 02/11/2021    Radiology DG Abdomen 1 View  Result Date: 01/21/2023 CLINICAL DATA:  Post Vas-Cath insertion EXAM: ABDOMEN - 1 VIEW COMPARISON:  08/18/2020 FINDINGS: Right groin Vas-Cath in place with the tip in the upper pelvis. Nonobstructive bowel gas pattern. No organomegaly or free air. Prior cholecystectomy. IMPRESSION: Right groin Vas-Cath in the upper pelvis. No acute findings. Electronically Signed   By: Charlett Nose M.D.   On: 01/21/2023 20:19   DG HIP UNILAT WITH PELVIS 2-3 VIEWS RIGHT  Result Date: 01/17/2023 CLINICAL DATA:  Right hip surgery EXAM: DG HIP (WITH OR WITHOUT PELVIS) 2-3V RIGHT COMPARISON:  01/12/2023 FINDINGS: Status post right total hip arthroplasty. Arthroplasty components are in their expected alignment without periprosthetic lucency or fracture. Bones are diffusely demineralized. Postoperative soft tissue air has resolved. IMPRESSION: Status post right total hip arthroplasty without evidence of hardware complication. Electronically Signed   By: Duanne Guess D.O.   On: 01/17/2023 11:59   DG HIP UNILAT W OR W/O PELVIS 2-3 VIEWS RIGHT  Result Date: 01/12/2023 CLINICAL DATA:  Postop. EXAM: DG HIP (WITH OR WITHOUT PELVIS) 2-3V RIGHT COMPARISON:  Preoperative imaging. FINDINGS: Removal of femoral intramedullary nail and trans trochanteric screw fixation. Interval right hip arthroplasty in expected alignment. Cerclage wire about the femoral stem. Chronic  displacement of the lesser trochanter. The bones are subjectively under mineralized. Remote pubic rami fractures. Recent postsurgical change includes air and edema in the soft tissues. IMPRESSION: Interval right hip arthroplasty without immediate postoperative complication. Electronically Signed   By: Narda Rutherford M.D.   On: 01/12/2023 19:53   DG HIP UNILAT WITH PELVIS 1V RIGHT  Result Date: 01/12/2023 CLINICAL DATA:  Elective surgery. EXAM: DG HIP (WITH OR WITHOUT PELVIS) 1V RIGHT COMPARISON:  Preoperative imaging FINDINGS: Single fluoroscopic spot view of the right hip obtained in the operating room. Image obtained during hip arthroplasty. Fluoroscopy time 10 seconds. Dose 4.91 mGy. IMPRESSION: Intraoperative fluoroscopy during right hip arthroplasty. Electronically Signed   By: Narda Rutherford M.D.   On: 01/12/2023 18:26   DG C-Arm 1-60 Min-No Report  Result Date: 01/12/2023 Fluoroscopy was utilized by the requesting physician.  No radiographic interpretation.   DG C-Arm 1-60 Min-No Report  Result Date: 01/12/2023 Fluoroscopy was utilized by the requesting physician.  No radiographic interpretation.   ECHOCARDIOGRAM COMPLETE  Result Date: 01/12/2023    ECHOCARDIOGRAM REPORT   Patient Name:   CAMBRIDGE WHITFIELD Date of Exam: 01/11/2023 Medical Rec #:  [id number]       Height:       73.0 in Accession #:    [id number]      Weight:       216.5 lb Date of Birth:  Aug 30, 1930        BSA:          2.225 m Patient Age:    89 years        BP:           102/45 mmHg Patient Gender: M               HR:           60 bpm. Exam Location:  Inpatient Procedure: 2D Echo, Cardiac Doppler and Color Doppler Indications:    Abnormal ECG R94.31  History:        Patient has prior history of Echocardiogram examinations, most                 recent 01/11/2018. Cardiomyopathy and CHF, CAD, Angina and                 Previous Myocardial Infarction, Prior CABG, Arrythmias:Atrial                 Fibrillation,  Signs/Symptoms:Hypotension and Shortness of                 Breath; Risk Factors:Diabetes and Dyslipidemia. ESRD.  Sonographer:    [name] Referring Phys: [id number] ANASTASSIA DOUTOVA IMPRESSIONS  1. Left ventricular ejection fraction, by estimation, is 35 to 40%. The left  ventricle has moderately decreased function. The left ventricle demonstrates global hypokinesis. There is mild concentric left ventricular hypertrophy. Left ventricular diastolic parameters are indeterminate. Elevated left ventricular end-diastolic pressure.  2. Right ventricular systolic function is mildly reduced. The right ventricular size is severely enlarged. There is moderately elevated pulmonary artery systolic pressure. The estimated right ventricular systolic pressure is 53.9 mmHg.  3. The mitral valve is degenerative. Mild mitral valve regurgitation. No evidence of mitral stenosis.  4. The aortic valve is tricuspid. Aortic valve regurgitation is mild to moderate. Aortic valve sclerosis/calcification is present, without any evidence of aortic stenosis. Aortic valve area, by VTI measures 1.89 cm. Aortic valve mean gradient measures 5.0 mmHg. Aortic valve Vmax measures 1.34 m/s.  5. Aortic dilatation noted. 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 There is mild dilatation of the ascending aorta, measuring 39 mm.  6. The inferior vena cava is dilated in size with <50% respiratory variability, suggesting right atrial pressure of 15 mmHg.  7. Recommend CHest CTA to assess ascending aortic aneurysm. FINDINGS  Left Ventricle: Left ventricular ejection fraction, by estimation, is 35 to 40%. The left ventricle has moderately decreased function. The left ventricle demonstrates global hypokinesis. The left ventricular internal cavity size was normal in size. There is mild concentric left ventricular hypertrophy. Left ventricular diastolic parameters are indeterminate. Elevated left ventricular end-diastolic pressure. Right Ventricle:  The right ventricular size is severely enlarged. No increase in right ventricular wall thickness. Right ventricular systolic function is mildly reduced. There is moderately elevated pulmonary artery systolic pressure. The tricuspid regurgitant velocity is 3.12 m/s, and with an assumed right atrial pressure of 15 mmHg, the estimated right ventricular systolic pressure is 53.9 mmHg. Left Atrium: Left atrial size was normal in size. Right Atrium: Right atrial size was normal in size. Pericardium: There is no evidence of pericardial effusion. Mitral Valve: The mitral valve is degenerative in appearance. There is mild thickening of the mitral valve leaflet(s). There is mild calcification of the mitral valve leaflet(s). Mild to moderate mitral annular calcification. Mild mitral valve regurgitation. No evidence of mitral valve stenosis. Tricuspid Valve: The tricuspid valve is normal in structure. Tricuspid valve regurgitation is mild . No evidence of tricuspid stenosis. Aortic Valve: The aortic valve is tricuspid. Aortic valve regurgitation is mild to moderate. Aortic regurgitation PHT measures 550 msec. Aortic valve sclerosis/calcification is present, without any evidence of aortic stenosis. Aortic valve mean gradient measures 5.0 mmHg. Aortic valve peak gradient measures 7.2 mmHg. Aortic valve area, by VTI measures 1.89 cm. Pulmonic Valve: The pulmonic valve was normal in structure. Pulmonic valve regurgitation is mild. No evidence of pulmonic stenosis. Aorta: Aortic dilatation noted. There is mild dilatation of the ascending aorta, measuring 39 mm. There is an aneurysm involving the ascending aorta measuring 65 mm. Venous: The inferior vena cava is dilated in size with less than 50% respiratory variability, suggesting right atrial pressure of 15 mmHg. IAS/Shunts: No atrial level shunt detected by color flow Doppler.  LEFT VENTRICLE PLAX 2D LVIDd:         5.30 cm   Diastology LVIDs:         4.40 cm   LV e' medial:     5.25 cm/s LV PW:         1.20 cm   LV E/e' medial:  19.8 LV IVS:        1.20 cm   LV e' lateral:   9.75 cm/s LVOT diam:     2.10 cm  LV E/e' lateral: 10.7 LV SV:         67 LV SV Index:   30 LVOT Area:     3.46 cm  RIGHT VENTRICLE             IVC RV S prime:     11.20 cm/s  IVC diam: 2.40 cm TAPSE (M-mode): 1.2 cm LEFT ATRIUM             Index        RIGHT ATRIUM           Index LA diam:        4.70 cm 2.11 cm/m   RA Area:     24.80 cm LA Vol (A2C):   71.4 ml 32.09 ml/m  RA Volume:   69.90 ml  31.41 ml/m LA Vol (A4C):   70.9 ml 31.86 ml/m LA Biplane Vol: 73.6 ml 33.07 ml/m  AORTIC VALVE                    PULMONIC VALVE AV Area (Vmax):    2.26 cm     PR End Diast Vel: 3.33 msec AV Area (Vmean):   1.97 cm AV Area (VTI):     1.89 cm AV Vmax:           134.50 cm/s AV Vmean:          99.800 cm/s AV VTI:            0.353 m AV Peak Grad:      7.2 mmHg AV Mean Grad:      5.0 mmHg LVOT Vmax:         87.68 cm/s LVOT Vmean:        56.625 cm/s LVOT VTI:          0.192 m LVOT/AV VTI ratio: 0.55 AI PHT:            550 msec  AORTA Ao Root diam: 3.90 cm Ao Asc diam:  6.10 cm MITRAL VALVE                TRICUSPID VALVE MV Area (PHT): 4.39 cm     TR Peak grad:   38.9 mmHg MV Decel Time: 173 msec     TR Vmax:        312.00 cm/s MR Peak grad: 69.2 mmHg MR Vmax:      416.00 cm/s   SHUNTS MV E velocity: 104.00 cm/s  Systemic VTI:  0.19 m MV A velocity: 56.60 cm/s   Systemic Diam: 2.10 cm MV E/A ratio:  1.84 Armanda Magic MD Electronically signed by Armanda Magic MD Signature Date/Time: 01/12/2023/8:09:45 AM    Final    DG CHEST PORT 1 VIEW  Result Date: 01/10/2023 CLINICAL DATA:  Preop cardiovascular exam EXAM: PORTABLE CHEST 1 VIEW COMPARISON:  11/12/2022 FINDINGS: Prior CABG. Cardiomegaly. Aortic atherosclerosis. Vascular congestion. No overt edema. Linear atelectasis in the left mid lung. No visible effusions. No acute bony abnormality. IMPRESSION: Cardiomegaly, vascular congestion. Left mid lung subsegmental  atelectasis. Electronically Signed   By: Charlett Nose M.D.   On: 01/10/2023 22:55   CT HIP RIGHT WO CONTRAST  Result Date: 01/10/2023 CLINICAL DATA:  Hip replacement, loosening suspected Hip surgical planning EXAM: CT OF THE RIGHT HIP WITHOUT CONTRAST TECHNIQUE: Multidetector CT imaging of the right hip was performed according to the standard protocol. Multiplanar CT image reconstructions were also generated. RADIATION DOSE REDUCTION: This exam was performed according to the departmental dose-optimization program which  includes automated exposure control, adjustment of the mA and/or kV according to patient size and/or use of iterative reconstruction technique. COMPARISON:  X-ray 01/08/2023, 11/12/2022 FINDINGS: Bones/Joint/Cartilage Postsurgical changes from right hip ORIF associated with a intertrochanteric fracture of the proximal right femur. Fracture lines remain visible. There is mild periosteal new bone formation at the fracture location. Femoral neck lag screw has migrated within the femoral head and the distal tip is now positioned intra-articularly extending 2 mm beyond the femoral head cortex. No definite new fracture. Hip joint alignment is maintained without dislocation. Mild to moderate right hip joint osteoarthritis. Old healed fractures of the right superior and inferior pubic rami. Ligaments Suboptimally assessed by CT. Muscles and Tendons No acute musculotendinous abnormality by CT. Soft tissues Nonspecific soft tissue swelling/edema over the lateral aspect of the right hip and proximal thigh. No organized fluid collections. Atherosclerotic vascular calcification. No right inguinal lymphadenopathy. IMPRESSION: 1. Postsurgical changes from right hip ORIF associated with a intertrochanteric fracture of the proximal right femur. Fracture lines remain visible. There is mild periosteal new bone formation at the fracture location. 2. Femoral neck lag screw has migrated within the femoral head and the  distal tip is now positioned intra-articularly. 3. Mild to moderate right hip joint osteoarthritis. 4. Old healed fractures of the right superior and inferior pubic rami. 5. Nonspecific soft tissue swelling/edema over the lateral aspect of the right hip and proximal thigh. No organized fluid collections. Electronically Signed   By: Duanne Guess D.O.   On: 01/10/2023 11:44   DG Femur Portable Min 2 Views Right  Result Date: 01/08/2023 CLINICAL DATA:  Right hip surgery in June with worsening pain in the right hip over the past week. EXAM: RIGHT FEMUR PORTABLE 2 VIEW COMPARISON:  Right hip radiographs dated 11/12/2022. FINDINGS: The patient is status post placement of a femoral shaft intramedullary nail and femoral neck screw on the right. Compared to 11/12/2022, the femoral neck screw has advanced with the tip now extending slightly beyond the right femoral head and into the right hip joint space. No new fracture is identified, however there is suggestion of compression of the intertrochanteric fracture of the right femur compared to prior exam. A displaced fracture fragment of the right lesser trochanter is redemonstrated. IMPRESSION: Suggestion of compression of the intertrochanteric fracture of the right femur compared to prior exam as well as interval advancement of the femoral neck screw with the tip now extending slightly beyond the right femoral head and into the right hip joint space. Electronically Signed   By: Romona Curls M.D.   On: 01/08/2023 15:01   DG Hip Unilat W or Wo Pelvis 2-3 Views Right  Result Date: 01/08/2023 CLINICAL DATA:  Right hip surgery in June with worsening pain in the right hip over the past week. EXAM: DG HIP (WITH OR WITHOUT PELVIS) 2-3V RIGHT COMPARISON:  Right hip radiographs dated 11/12/2022. FINDINGS: The patient is status post placement of a femoral shaft intramedullary nail and femoral neck screw on the right. Compared to 11/12/2022, the femoral neck screw has  advanced with the tip now extending slightly beyond the right femoral head and into the right hip joint space. No new fracture is identified, however there is suggestion of compression of the intertrochanteric fracture of the right femur compared to prior exam. A displaced fracture fragment of the right lesser trochanter is redemonstrated. IMPRESSION: Suggestion of compression of the intertrochanteric fracture of the right femur compared to prior exam as well as interval advancement  of the femoral neck screw with the tip now extending slightly beyond the right femoral head and into the right hip joint space. Electronically Signed   By: Romona Curls M.D.   On: 01/08/2023 15:00      Assessment/Plan 1. CRI on chronic HD with thrombosed left UE AV graft (initial failure). 2. Plan to place Temp HD cath is right groin.  Discussed plan with Nephrology Attending.  Discussed with daughter and POA (in his room).  Discussed with patient as well.  Risks and plan discussed and accepted.  No allergies except Amoxicillin.   We will proceed with temporary dialysis catheter placement at this time.  Risks and benefits discussed with patient and/or family, and the catheter will be placed to allow immediate initiation of dialysis.  On Tuesday he will proceed to the Vascular Lab for thrombectomy/revision of the left arm AV Graft with attempt to salvage.  I personally obtained the consent.     Iline Oven, MD  01/21/2023 9:00 PM

## 2023-01-21 NOTE — Op Note (Signed)
  OPERATIVE NOTE   PROCEDURE: Ultrasound guidance for vascular access Right Common Femoral vein Placement of a 20 cm dialysis catheter right common femoral vein with tip in the right common iliac vein or distal IVC  PRE-OPERATIVE DIAGNOSIS: 1. Chronic renal failure with thrombosed left arm arteriovenous access 2. Recent right hip fracture repeat repair  POST-OPERATIVE DIAGNOSIS: Same  SURGEON: Festus Barren, MD  ASSISTANT(S): None  ANESTHESIA: local  ESTIMATED BLOOD LOSS: Minimal   FINDING(S): 1.  None  SPECIMEN(S):  None  INDICATIONS:    Patient is a 89 y.o.male who presents with loss of his chronic HD access in need of routine maintenance HD.  Risks and benefits were discussed, and informed consent was obtained. His daughter and POA is physically present as well.  DESCRIPTION: After obtaining full informed written consent, the patient was laid flat in the bed.  The right groin was sterilely prepped and draped in a sterile surgical field was created. The right common femoral vein was visualized with ultrasound and found to be widely patent. It was then accessed under direct guidance without difficulty with a Seldinger needle and a permanent image was recorded. A J-wire was then placed. After skin nick and dilatation, a 20 cm non-cuffed dialysis catheter was placed over the wire and the wire was removed. The lumens withdrew dark red nonpulsatile blood and flushed easily with sterile saline. The catheter was secured to the skin with 3 nylon sutures. Sterile dressing was placed.  A KUB shows the catheter tip to be in the region of the right common iliac vein or possibly distal IVC.  The course appears typical and there is no evidence of a complication.  A replaced right hip is also noted.  COMPLICATIONS: None  CONDITION: Stable  Iline Oven 01/21/2023 9:11 PM

## 2023-01-21 NOTE — Assessment & Plan Note (Signed)
-   The patient will be admitted to an observation medical bed - The patient has end-stage renal disease on hemodialysis on MWF.  He missed Wednesday and underwent dialysis yesterday. - Vascular surgery consult was obtained. - The patient will have a temporary catheter placed for hemodialysis. - Dr. Gilda Crease was notified about the patient and Dr. Cheree Ditto is covering him. - Dr. Wynelle Link was consulted and is aware about the patient.

## 2023-01-21 NOTE — Assessment & Plan Note (Signed)
-   The patient will placed on supplemental coverage with NovoLog.

## 2023-01-21 NOTE — Assessment & Plan Note (Signed)
-   We will continue statin therapy and as needed sublingual nitroglycerin as well as Plavix.

## 2023-01-21 NOTE — ED Triage Notes (Addendum)
Arrives from [facility] for 'clogged  access' EMS unsure if AV fisutla or vas cath.  VS wnl.  AAOx3.  Skin warm and dry.  AV fistula to right upper arm.  Was dialyzed yesterday, per patient.  Patient states he was going again today because he was behind a day.

## 2023-01-21 NOTE — ED Provider Notes (Signed)
[facility] Provider Note    Event Date/Time   First MD Initiated Contact with Patient 01/21/23 1524     (approximate)   History   Chief Complaint Vascular Access Problem   HPI  Matthew [name] is a 89 y.o. male with past medical history of diabetes, CAD status post CABG, atrial fibrillation, ESRD on HD, CHF, and orthostatic hypotension on midodrine who presents to the ED complaining of vascular access problem.  Patient reports that he went to his usual dialysis appointment today and was told that his fistula was "clogged."  He denies any pain or swelling in the area, states he got a full run of dialysis yesterday after missing his appointment on Wednesday.  He has not had any difficulty breathing, does report some leg swelling that is unchanged from his baseline.     Physical Exam   Triage Vital Signs: ED Triage Vitals  Encounter Vitals Group     BP 01/21/23 1307 (!) 100/47     Systolic BP Percentile --      Diastolic BP Percentile --      Pulse Rate 01/21/23 1602 98     Resp 01/21/23 1307 16     Temp 01/21/23 1307 99.1 F (37.3 C)     Temp Source 01/21/23 1307 Oral     SpO2 01/21/23 1307 95 %     Weight 01/21/23 1207 210 lb 8.6 oz (95.5 kg)     Height --      Head Circumference --      Peak Flow --      Pain Score 01/21/23 1207 0     Pain Loc --      Pain Education --      Exclude from Growth Chart --     Most recent vital signs: Vitals:   01/21/23 1307 01/21/23 1602  BP: (!) 100/47 (!) 106/49  Pulse:  98  Resp: 16   Temp: 99.1 F (37.3 C)   SpO2: 95% 100%    Constitutional: Alert and oriented. Eyes: Conjunctivae are normal. Head: Atraumatic. Nose: No congestion/rhinnorhea. Mouth/Throat: Mucous membranes are moist.  Cardiovascular: Normal rate, regular rhythm. Grossly normal heart sounds.  2+ radial pulses bilaterally.  Left upper extremity AV fistula without palpable thrill, no associated tenderness. Respiratory: Normal  respiratory effort.  No retractions. Lungs CTAB. Gastrointestinal: Soft and nontender. No distention. Musculoskeletal: No lower extremity tenderness, 2+ pitting edema to knees bilaterally. Neurologic:  Normal speech and language. No gross focal neurologic deficits are appreciated.    ED Results / Procedures / Treatments   Labs (all labs ordered are listed, but only abnormal results are displayed) Labs Reviewed  CBC WITH DIFFERENTIAL/PLATELET - Abnormal; Notable for the following components:      Result Value   RBC 2.70 (*)    Hemoglobin 8.4 (*)    HCT 26.8 (*)    RDW 16.2 (*)    Neutro Abs 8.0 (*)    Lymphs Abs 0.5 (*)    Monocytes Absolute 1.4 (*)    Abs Immature Granulocytes 0.09 (*)    All other components within normal limits  COMPREHENSIVE METABOLIC PANEL - Abnormal; Notable for the following components:   Glucose, Bld 172 (*)    BUN 58 (*)    Creatinine, Ser 3.90 (*)    Calcium 8.3 (*)    Total Protein 6.2 (*)    Albumin 2.7 (*)    Alkaline Phosphatase 145 (*)    GFR, Estimated 14 (*)  All other components within normal limits    PROCEDURES:  Critical Care performed: No  Procedures   MEDICATIONS ORDERED IN ED: Medications - No data to display   IMPRESSION / MDM / ASSESSMENT AND PLAN / ED COURSE  I reviewed the triage vital signs and the nursing notes.                              89 y.o. male with past medical history of diabetes, CAD status post CABG, atrial fibrillation, CHF, ESRD on HD, and orthostatic hypotension who presents to the ED complaining of problem with his dialysis access.  Patient's presentation is most consistent with acute presentation with potential threat to life or bodily function.  Differential diagnosis includes, but is not limited to, clotted fistula, infected fistula, hyperkalemia, pulmonary edema.  Patient nontoxic-appearing and in no acute distress, vital signs are remarkable for borderline hypotension, although this appears  to be a chronic issue for the patient.  His left upper extremity has no palpable thrill however there is no tenderness or signs of infection.  Labs with stable chronic anemia, no significant leukocytosis or electrolyte abnormality noted.  LFTs are also unremarkable.  Case reviewed with Dr. Gilda Crease of vascular surgery, who recommends admission to the hospitalist service for temporary catheter placement and restarting of dialysis.  Case discussed with hospitalist for admission.      FINAL CLINICAL IMPRESSION(S) / ED DIAGNOSES   Final diagnoses:  ESRD on hemodialysis (HCC)  Problem with dialysis access, initial encounter [facility])     Rx / DC Orders   ED Discharge Orders     None        Note:  This document was prepared using Dragon voice recognition software and may include unintentional dictation errors.   [name], MD 01/21/23 [phone]

## 2023-01-21 NOTE — Progress Notes (Signed)
[facility]  ROUNDING NOTE   Subjective:   [name] was admitted to [facility] on 01/21/2023 for AV fistula occlusion, initial encounter [facility]) [T82.898A]  Last hemodialysis treatment was yesterday. However today, patient presented to dialysis center and his AVF was thrombosed with no bruit or thrill.   Family at bedside.   Patient recent discharged from [facility] from 8/19 to 8/26 for migration of hip nail after right hip surgery. He was discharged to SNF.   Objective:  Vital signs in last 24 hours:  Temp:  [99.1 F (37.3 C)] 99.1 F (37.3 C) (08/30 1307) Pulse Rate:  [98] 98 (08/30 1602) Resp:  [16] 16 (08/30 1307) BP: (100-106)/(47-49) 106/49 (08/30 1602) SpO2:  [95 %-100 %] 100 % (08/30 1602) Weight:  [95.5 kg] 95.5 kg (08/30 1207)  Weight change:  Filed Weights   01/21/23 1207  Weight: 95.5 kg    Intake/Output: No intake/output data recorded.   Intake/Output this shift:  No intake/output data recorded.  Physical Exam: General: NAD, laying in stretcher  Head: Normocephalic, atraumatic. Moist oral mucosal membranes  Eyes: Anicteric, PERRL  Neck: Supple, trachea midline  Lungs:  Clear to auscultation  Heart: Regular rate and rhythm  Abdomen:  Soft, nontender,   Extremities:  no peripheral edema.  Neurologic: Nonfocal, moving all four extremities  Skin: No lesions  Access: Left AVF with no bruit or thrill    Basic Metabolic Panel: Recent Labs  Lab 01/15/23 0316 01/16/23 0327 01/21/23 1400  NA 134* 130* 135  K 4.3 4.1 3.8  CL 93* 93* 98  CO2 28 28 29   GLUCOSE 144* 162* 172*  BUN 38* 65* 58*  CREATININE 2.63* 3.81* 3.90*  CALCIUM 8.4* 8.3* 8.3*    Liver Function Tests: Recent Labs  Lab 01/21/23 1400  AST 21  ALT <5  ALKPHOS 145*  BILITOT 0.9  PROT 6.2*  ALBUMIN 2.7*   No results for input(s): "LIPASE", "AMYLASE" in the last 168 hours. No results for input(s): "AMMONIA" in the last 168 hours.  CBC: Recent Labs  Lab  01/15/23 0316 01/16/23 0327 01/21/23 1400  WBC 10.4 9.0 10.3  NEUTROABS  --   --  8.0*  HGB 8.5* 8.6* 8.4*  HCT 26.6* 27.1* 26.8*  MCV 98.9 97.8 99.3  PLT 136* 148* 173    Cardiac Enzymes: No results for input(s): "CKTOTAL", "CKMB", "CKMBINDEX", "TROPONINI" in the last 168 hours.  BNP: Invalid input(s): "POCBNP"  CBG: Recent Labs  Lab 01/16/23 0756 01/16/23 1159 01/16/23 1635 01/16/23 2104 01/17/23 0745  GLUCAP 135* 212* 170* 142* 198*    Microbiology: Results for orders placed or performed during the hospital encounter of 01/10/23  Surgical pcr screen     Status: None   Collection Time: 01/11/23  5:10 AM   Specimen: Nasal Mucosa; Nasal Swab  Result Value Ref Range Status   MRSA, PCR NEGATIVE NEGATIVE Final   Staphylococcus aureus NEGATIVE NEGATIVE Final    Comment: (NOTE) The Xpert SA Assay (FDA approved for NASAL specimens in patients 58 years of age and older), is one component of a comprehensive surveillance program. It is not intended to diagnose infection nor to guide or monitor treatment. Performed at [facility] Lab, [address] [place], [place] [address]     Coagulation Studies: No results for input(s): "LABPROT", "INR" in the last 72 hours.  Urinalysis: No results for input(s): "COLORURINE", "LABSPEC", "PHURINE", "GLUCOSEU", "HGBUR", "BILIRUBINUR", "KETONESUR", "PROTEINUR", "UROBILINOGEN", "NITRITE", "LEUKOCYTESUR" in the last 72 hours.  Invalid input(s): "APPERANCEUR"    Imaging: No results found.   Medications:       Assessment/ Plan:  [name] is a 89 y.o.  male with end stage renal disease on hemodialysis MWF, right hip fracture, chronic orthostatic hypotension, atrial fibrillation not on anticoagulation, diabetes mellitus type II who presents to [facility] for AV fistula occlusion, initial encounter (HCC) [T82.898A]  [facility] MWF [address] 92.5kg   1.  ESRD on HD MWF.  With complication of dialysis device.   -  Appreciate vascular input  - plan for dialysis treatment tomorrow after dialysis access obtained.    2.  Hypotension  - midodrine with HD treatments.    3.  Anemia chronic kidney disease: hemoglobin 8.4.    4.  Secondary hyperparathyroidism.    - not currently on phosphate binders or vitamin D agents.      LOS: 0 Copeland Neisen 8/30/20245:02 PM

## 2023-01-22 ENCOUNTER — Encounter: Payer: Self-pay | Admitting: Family Medicine

## 2023-01-22 DIAGNOSIS — I5022 Chronic systolic (congestive) heart failure: Secondary | ICD-10-CM | POA: Diagnosis present

## 2023-01-22 DIAGNOSIS — M1990 Unspecified osteoarthritis, unspecified site: Secondary | ICD-10-CM | POA: Diagnosis not present

## 2023-01-22 DIAGNOSIS — D631 Anemia in chronic kidney disease: Secondary | ICD-10-CM | POA: Diagnosis present

## 2023-01-22 DIAGNOSIS — Z96641 Presence of right artificial hip joint: Secondary | ICD-10-CM | POA: Diagnosis present

## 2023-01-22 DIAGNOSIS — I132 Hypertensive heart and chronic kidney disease with heart failure and with stage 5 chronic kidney disease, or end stage renal disease: Secondary | ICD-10-CM | POA: Diagnosis present

## 2023-01-22 DIAGNOSIS — N186 End stage renal disease: Secondary | ICD-10-CM | POA: Diagnosis present

## 2023-01-22 DIAGNOSIS — T82868A Thrombosis of vascular prosthetic devices, implants and grafts, initial encounter: Secondary | ICD-10-CM | POA: Diagnosis present

## 2023-01-22 DIAGNOSIS — D638 Anemia in other chronic diseases classified elsewhere: Secondary | ICD-10-CM | POA: Diagnosis not present

## 2023-01-22 DIAGNOSIS — Z951 Presence of aortocoronary bypass graft: Secondary | ICD-10-CM | POA: Diagnosis not present

## 2023-01-22 DIAGNOSIS — Z9181 History of falling: Secondary | ICD-10-CM | POA: Diagnosis not present

## 2023-01-22 DIAGNOSIS — Z7982 Long term (current) use of aspirin: Secondary | ICD-10-CM | POA: Diagnosis not present

## 2023-01-22 DIAGNOSIS — R600 Localized edema: Secondary | ICD-10-CM | POA: Diagnosis not present

## 2023-01-22 DIAGNOSIS — T82590A Other mechanical complication of surgically created arteriovenous fistula, initial encounter: Secondary | ICD-10-CM | POA: Diagnosis not present

## 2023-01-22 DIAGNOSIS — I480 Paroxysmal atrial fibrillation: Secondary | ICD-10-CM | POA: Diagnosis present

## 2023-01-22 DIAGNOSIS — N2581 Secondary hyperparathyroidism of renal origin: Secondary | ICD-10-CM | POA: Diagnosis present

## 2023-01-22 DIAGNOSIS — S72141G Displaced intertrochanteric fracture of right femur, subsequent encounter for closed fracture with delayed healing: Secondary | ICD-10-CM | POA: Diagnosis not present

## 2023-01-22 DIAGNOSIS — E785 Hyperlipidemia, unspecified: Secondary | ICD-10-CM | POA: Diagnosis present

## 2023-01-22 DIAGNOSIS — K5900 Constipation, unspecified: Secondary | ICD-10-CM | POA: Diagnosis not present

## 2023-01-22 DIAGNOSIS — T82898A Other specified complication of vascular prosthetic devices, implants and grafts, initial encounter: Secondary | ICD-10-CM | POA: Diagnosis not present

## 2023-01-22 DIAGNOSIS — I951 Orthostatic hypotension: Secondary | ICD-10-CM | POA: Diagnosis present

## 2023-01-22 DIAGNOSIS — Z79890 Hormone replacement therapy (postmenopausal): Secondary | ICD-10-CM | POA: Diagnosis not present

## 2023-01-22 DIAGNOSIS — E8809 Other disorders of plasma-protein metabolism, not elsewhere classified: Secondary | ICD-10-CM | POA: Diagnosis not present

## 2023-01-22 DIAGNOSIS — J309 Allergic rhinitis, unspecified: Secondary | ICD-10-CM | POA: Diagnosis not present

## 2023-01-22 DIAGNOSIS — M810 Age-related osteoporosis without current pathological fracture: Secondary | ICD-10-CM | POA: Diagnosis not present

## 2023-01-22 DIAGNOSIS — Z8249 Family history of ischemic heart disease and other diseases of the circulatory system: Secondary | ICD-10-CM | POA: Diagnosis not present

## 2023-01-22 DIAGNOSIS — Z992 Dependence on renal dialysis: Secondary | ICD-10-CM | POA: Diagnosis not present

## 2023-01-22 DIAGNOSIS — Z87891 Personal history of nicotine dependence: Secondary | ICD-10-CM | POA: Diagnosis not present

## 2023-01-22 DIAGNOSIS — F1090 Alcohol use, unspecified, uncomplicated: Secondary | ICD-10-CM | POA: Diagnosis not present

## 2023-01-22 DIAGNOSIS — Z85828 Personal history of other malignant neoplasm of skin: Secondary | ICD-10-CM | POA: Diagnosis not present

## 2023-01-22 DIAGNOSIS — K219 Gastro-esophageal reflux disease without esophagitis: Secondary | ICD-10-CM | POA: Diagnosis not present

## 2023-01-22 DIAGNOSIS — Z66 Do not resuscitate: Secondary | ICD-10-CM | POA: Diagnosis present

## 2023-01-22 DIAGNOSIS — E559 Vitamin D deficiency, unspecified: Secondary | ICD-10-CM | POA: Diagnosis not present

## 2023-01-22 DIAGNOSIS — Z91158 Patient's noncompliance with renal dialysis for other reason: Secondary | ICD-10-CM | POA: Diagnosis not present

## 2023-01-22 DIAGNOSIS — T82858A Stenosis of vascular prosthetic devices, implants and grafts, initial encounter: Secondary | ICD-10-CM | POA: Diagnosis not present

## 2023-01-22 DIAGNOSIS — I482 Chronic atrial fibrillation, unspecified: Secondary | ICD-10-CM | POA: Diagnosis present

## 2023-01-22 DIAGNOSIS — Z7902 Long term (current) use of antithrombotics/antiplatelets: Secondary | ICD-10-CM | POA: Diagnosis not present

## 2023-01-22 DIAGNOSIS — I252 Old myocardial infarction: Secondary | ICD-10-CM | POA: Diagnosis not present

## 2023-01-22 DIAGNOSIS — Y832 Surgical operation with anastomosis, bypass or graft as the cause of abnormal reaction of the patient, or of later complication, without mention of misadventure at the time of the procedure: Secondary | ICD-10-CM | POA: Diagnosis present

## 2023-01-22 DIAGNOSIS — L89152 Pressure ulcer of sacral region, stage 2: Secondary | ICD-10-CM | POA: Diagnosis not present

## 2023-01-22 DIAGNOSIS — I2510 Atherosclerotic heart disease of native coronary artery without angina pectoris: Secondary | ICD-10-CM | POA: Diagnosis present

## 2023-01-22 DIAGNOSIS — Z741 Need for assistance with personal care: Secondary | ICD-10-CM | POA: Diagnosis not present

## 2023-01-22 DIAGNOSIS — I130 Hypertensive heart and chronic kidney disease with heart failure and stage 1 through stage 4 chronic kidney disease, or unspecified chronic kidney disease: Secondary | ICD-10-CM | POA: Diagnosis not present

## 2023-01-22 DIAGNOSIS — G4700 Insomnia, unspecified: Secondary | ICD-10-CM | POA: Diagnosis not present

## 2023-01-22 DIAGNOSIS — I959 Hypotension, unspecified: Secondary | ICD-10-CM | POA: Diagnosis not present

## 2023-01-22 DIAGNOSIS — E039 Hypothyroidism, unspecified: Secondary | ICD-10-CM | POA: Diagnosis present

## 2023-01-22 DIAGNOSIS — E1122 Type 2 diabetes mellitus with diabetic chronic kidney disease: Secondary | ICD-10-CM | POA: Diagnosis present

## 2023-01-22 DIAGNOSIS — T82868D Thrombosis of vascular prosthetic devices, implants and grafts, subsequent encounter: Secondary | ICD-10-CM | POA: Diagnosis not present

## 2023-01-22 LAB — BASIC METABOLIC PANEL
Anion gap: 13 (ref 5–15)
BUN: 65 mg/dL — ABNORMAL HIGH (ref 8–23)
CO2: 28 mmol/L (ref 22–32)
Calcium: 8.3 mg/dL — ABNORMAL LOW (ref 8.9–10.3)
Chloride: 93 mmol/L — ABNORMAL LOW (ref 98–111)
Creatinine, Ser: 4.72 mg/dL — ABNORMAL HIGH (ref 0.61–1.24)
GFR, Estimated: 11 mL/min — ABNORMAL LOW (ref >60)
Glucose, Bld: 134 mg/dL — ABNORMAL HIGH (ref 70–99)
Potassium: 4 mmol/L (ref 3.5–5.1)
Sodium: 134 mmol/L — ABNORMAL LOW (ref 135–145)

## 2023-01-22 LAB — HEPATITIS B SURFACE ANTIGEN: Hepatitis B Surface Ag: NONREACTIVE

## 2023-01-22 LAB — CBC
HCT: 24.9 % — ABNORMAL LOW (ref 39.0–52.0)
Hemoglobin: 8 g/dL — ABNORMAL LOW (ref 13.0–17.0)
MCH: 31.7 pg (ref 26.0–34.0)
MCHC: 32.1 g/dL (ref 30.0–36.0)
MCV: 98.8 fL (ref 80.0–100.0)
Platelets: 181 10*3/uL (ref 150–400)
RBC: 2.52 MIL/uL — ABNORMAL LOW (ref 4.22–5.81)
RDW: 16.1 % — ABNORMAL HIGH (ref 11.5–15.5)
WBC: 8.6 10*3/uL (ref 4.0–10.5)
nRBC: 0 % (ref 0.0–0.2)

## 2023-01-22 LAB — GLUCOSE, CAPILLARY
Glucose-Capillary: 171 mg/dL — ABNORMAL HIGH (ref 70–99)
Glucose-Capillary: 157 mg/dL — ABNORMAL HIGH (ref 70–99)
Glucose-Capillary: 168 mg/dL — ABNORMAL HIGH (ref 70–99)
Glucose-Capillary: 131 mg/dL — ABNORMAL HIGH (ref 70–99)

## 2023-01-22 LAB — MRSA NEXT GEN BY PCR, NASAL: MRSA by PCR Next Gen: NOT DETECTED

## 2023-01-22 MED ORDER — ACETAMINOPHEN 325 MG PO TABS: 650.0000 mg | ORAL_TABLET | Freq: Four times a day (QID) | ORAL | Status: DC | PRN

## 2023-01-22 MED ORDER — LEVOTHYROXINE SODIUM 100 MCG PO TABS
100.0000 ug | ORAL_TABLET | Freq: Every day | ORAL | Status: DC
  Administered 2023-01-23 – 2023-01-26 (×3): 100 ug via ORAL
  Filled 2023-01-22 (×4): qty 1

## 2023-01-22 MED ORDER — POLYETHYLENE GLYCOL 3350 17 G PO PACK
17.0000 g | PACK | Freq: Every day | ORAL | Status: DC
  Administered 2023-01-22 – 2023-01-26 (×4): 17 g via ORAL
  Filled 2023-01-22 (×4): qty 1

## 2023-01-22 MED ORDER — MIDODRINE HCL 5 MG PO TABS
ORAL_TABLET | ORAL | Status: AC
  Filled 2023-01-22: qty 1

## 2023-01-22 MED ORDER — SENNOSIDES-DOCUSATE SODIUM 8.6-50 MG PO TABS
2.0000 | ORAL_TABLET | Freq: Two times a day (BID) | ORAL | Status: DC
  Administered 2023-01-22 – 2023-01-26 (×6): 2 via ORAL
  Filled 2023-01-22 (×7): qty 2

## 2023-01-22 MED ORDER — CHLORHEXIDINE GLUCONATE CLOTH 2 % EX PADS
6.0000 | MEDICATED_PAD | Freq: Every day | CUTANEOUS | Status: DC
  Administered 2023-01-22 – 2023-01-26 (×5): 6 via TOPICAL

## 2023-01-22 NOTE — Evaluation (Signed)
Occupational Therapy Evaluation Patient Details Name: Matthew [name] MRN: [id number] DOB: 1931/05/20 Today's Date: 01/22/2023   History of Present Illness Pt is 89 that presented to hospital from SNF due to L AVF occulsion, temp cath placed 01/21/23. Per chart pt is s/p R THA on 01/12/23. PMH: Anginal pain, arthritis, AAA, A fib, bradycardia, CAD, ESRD, GERD, HFrEF, HLD, HTN, IDA, MI, S/p CABG, squamous cell carcinoma, DM II, valvular insufficiency.   Clinical Impression   Pt. presents with weakness, limited activity tolerance, posterior hip precautions,  and limited functional mobility which limits the ability to complete basic ADL and IADL functioning. Pt. Resides at home with his daughter, and son-in-law. Pt. resides downstairs on the main level, while family uses the upstairs. Pt. required assist with ADL, and IADL tasks from his family as needed. Pt.'s family assists with home management, and meal preparation. Pt. Was independent with set-up of weekly pillbox, and is responsible for taking them at the appropriate times. Pt. Currently has a temporary Femoral HD catheter. Permanent catheter placement anticipated for Tuesday 9/3.  Pt. requires MinA for UE, and MaxA for LE ADLs. Pt. education was provided verbally about A/E use for LE ADLs 2/2 Posterior hip precautions. Pt. continues to benefit from OT services for ADL training, A/E training, and pt. Education about posterior hip precautions, home modification, and DME.    If plan is discharge home, recommend the following: A lot of help with bathing/dressing/bathroom;Assist for transportation;Help with stairs or ramp for entrance;A lot of help with walking and/or transfers    Functional Status Assessment  Patient has had a recent decline in their functional status and demonstrates the ability to make significant improvements in function in a reasonable and predictable amount of time.  Equipment Recommendations       Recommendations for Other  Services       Precautions / Restrictions Precautions Precautions: Fall;Posterior Hip Precaution Comments: recalls 2/3 today; Needed reminder for no internal rotation of the R hip Restrictions Weight Bearing Restrictions: Yes RLE Weight Bearing: Non weight bearing RLE Partial Weight Bearing Percentage or Pounds: 50% Other Position/Activity Restrictions: pt reporting 30% weight bearing on RLE      Mobility Bed Mobility               General bed mobility comments: deferred due to temp Femoral HD cath    Transfers                   General transfer comment: Deferred 2/2 temporary Femoral HD cath      Balance                                           ADL either performed or assessed with clinical judgement   ADL                                         General ADL Comments: Anticipate minA UE, and MaxA LE ADLs.     Vision Baseline Vision/History: 1 Wears glasses (reading glasses) Patient Visual Report: No change from baseline       Perception         Praxis         Pertinent Vitals/Pain Pain Assessment Pain Score: 3  Pain Location: Right LE Pain Descriptors /  Indicators: Sore, Discomfort, Grimacing, Operative site guarding, Moaning Pain Intervention(s): Monitored during session     Extremity/Trunk Assessment Upper Extremity Assessment Upper Extremity Assessment: Generalized weakness   Lower Extremity Assessment Lower Extremity Assessment: Generalized weakness (s/p R hip surgery, limited by pain)       Communication Communication Communication: No apparent difficulties   Cognition Arousal: Alert                                           General Comments       Exercises     Shoulder Instructions      Home Living Family/patient expects to be discharged to:: Private residence Living Arrangements: Children Available Help at Discharge: Family;Available PRN/intermittently Type of  Home: House Home Access: Stairs to enter [employer] of Steps: 7 total (3 at driveway and 4 at doorway) Entrance Stairs-Rails: Right;Left Home Layout: Able to live on main level with bedroom/bathroom;Two level (Children reside on the 2nd floor.)     Bathroom Shower/Tub: Walk-in shower   Bathroom Toilet: Handicapped height Bathroom Accessibility: Yes   Home Equipment: Cane - single point;Rollator (4 wheels);Shower seat;Grab bars - toilet;Grab bars - tub/shower;Wheelchair - manual   Additional Comments: Lives with daughter and son-in-law.      Prior Functioning/Environment Prior Level of Function : Independent/Modified Independent             Mobility Comments: uses rollator for ambulation as needed; not driving; ADLs Comments: Pt. reports daughter does cooking/cleaning. Pt. reports family assists with showers, an dmorning care as needed. Pt. independently sets up weekly pillbox, and is responsible for taking medications at the appropriate times.        OT Problem List:        OT Treatment/Interventions: Self-care/ADL training;Therapeutic exercise;Patient/family education;Therapeutic activities    OT Goals(Current goals can be found in the care plan section) Acute Rehab OT Goals Patient Stated Goal: To return home OT Goal Formulation: With patient/family Time For Goal Achievement: 02/05/23 Potential to Achieve Goals: Good  OT Frequency: Min 2X/week    Co-evaluation              AM-PAC OT "6 Clicks" Daily Activity     Outcome Measure Help from another person eating meals?: A Little Help from another person taking care of personal grooming?: A Little Help from another person toileting, which includes using toliet, bedpan, or urinal?: A Lot Help from another person bathing (including washing, rinsing, drying)?: A Lot Help from another person to put on and taking off regular upper body clothing?: A Little Help from another person to put on and taking off  regular lower body clothing?: A Lot 6 Click Score: 15   End of Session Nurse Communication: Mobility status  Activity Tolerance: Patient tolerated treatment well Patient left: in bed;with call bell/phone within reach;with bed alarm set  OT Visit Diagnosis: Muscle weakness (generalized) (M62.81);History of falling (Z91.81);Pain Pain - Right/Left: Right                Time: 1350-1410 OT Time Calculation (min): 20 min Charges:  OT General Charges $OT Visit: 1 Visit OT Evaluation $OT Eval Low Complexity: 1 Low  Olegario Messier, MS, OTR/L   Olegario Messier 01/22/2023, 2:25 PM

## 2023-01-22 NOTE — Progress Notes (Signed)
[facility]  ROUNDING NOTE   Subjective:   Mr. JEFFERY MCPHETERS was admitted to [facility] on 01/21/2023 for ESRD on hemodialysis [facility]) [N18.6, Z99.2] Problem with dialysis access, initial encounter [facility]) [T82.898A] AV fistula occlusion, initial encounter (HCC) [T82.898A] Hemodialysis AV fistula thrombosis (HCC) [W10.272Z]  Patient seen laying in bed Daughter at bedside Recently discharged from rehab  Denies pain or discomfort from femoral line  Objective:  Vital signs in last 24 hours:  Temp:  [97.7 F (36.5 C)-98.3 F (36.8 C)] 97.7 F (36.5 C) (08/31 0828) Pulse Rate:  [75-113] 98 (08/31 1600) Resp:  [14-25] 19 (08/31 1606) BP: (88-127)/(46-74) 109/74 (08/31 1606) SpO2:  [96 %-100 %] 96 % (08/31 1600)  Weight change:  Filed Weights   01/21/23 1207  Weight: 95.5 kg    Intake/Output: No intake/output data recorded.   Intake/Output this shift:  Total I/O In: 240 [P.O.:240] Out: -   Physical Exam: General: NAD, laying in stretcher  Head: Normocephalic, atraumatic. Moist oral mucosal membranes  Eyes: Anicteric  Lungs:  Clear to auscultation  Heart: Regular rate and rhythm  Abdomen:  Soft, nontender,   Extremities:  no peripheral edema.  Neurologic: Nonfocal, moving all four extremities  Skin: No lesions  Access: Left AVF with no bruit or thrill, rt femoral temp cath placed on 01/21/23    Basic Metabolic Panel: Recent Labs  Lab 01/16/23 0327 01/21/23 1400 01/22/23 0404  NA 130* 135 134*  K 4.1 3.8 4.0  CL 93* 98 93*  CO2 28 29 28   GLUCOSE 162* 172* 134*  BUN 65* 58* 65*  CREATININE 3.81* 3.90* 4.72*  CALCIUM 8.3* 8.3* 8.3*    Liver Function Tests: Recent Labs  Lab 01/21/23 1400  AST 21  ALT <5  ALKPHOS 145*  BILITOT 0.9  PROT 6.2*  ALBUMIN 2.7*   No results for input(s): "LIPASE", "AMYLASE" in the last 168 hours. No results for input(s): "AMMONIA" in the last 168 hours.  CBC: Recent Labs  Lab 01/16/23 0327 01/21/23 1400  01/22/23 0404  WBC 9.0 10.3 8.6  NEUTROABS  --  8.0*  --   HGB 8.6* 8.4* 8.0*  HCT 27.1* 26.8* 24.9*  MCV 97.8 99.3 98.8  PLT 148* 173 181    Cardiac Enzymes: No results for input(s): "CKTOTAL", "CKMB", "CKMBINDEX", "TROPONINI" in the last 168 hours.  BNP: Invalid input(s): "POCBNP"  CBG: Recent Labs  Lab 01/16/23 2104 01/17/23 0745 01/21/23 2159 01/22/23 0941 01/22/23 1156  GLUCAP 142* 198* 136* 171* 157*    Microbiology: Results for orders placed or performed during the hospital encounter of 01/21/23  MRSA Next Gen by PCR, Nasal     Status: None   Collection Time: 01/22/23  6:33 AM   Specimen: Nasal Mucosa; Nasal Swab  Result Value Ref Range Status   MRSA by PCR Next Gen NOT DETECTED NOT DETECTED Final    Comment: (NOTE) The GeneXpert MRSA Assay (FDA approved for NASAL specimens only), is one component of a comprehensive MRSA colonization surveillance program. It is not intended to diagnose MRSA infection nor to guide or monitor treatment for MRSA infections. Test performance is not FDA approved in patients less than 37 years old. Performed at [facility], [address] [place], [place] [address]     Coagulation Studies: No results for input(s): "LABPROT", "INR" in the last 72 hours.  Urinalysis: No results for input(s): "COLORURINE", "LABSPEC", "PHURINE", "GLUCOSEU", "HGBUR", "BILIRUBINUR", "KETONESUR", "PROTEINUR", "UROBILINOGEN", "NITRITE", "LEUKOCYTESUR" in the last 72 hours.  Invalid  input(s): "APPERANCEUR"    Imaging: DG Abdomen 1 View  Result Date: 01/21/2023 CLINICAL DATA:  Post Vas-Cath insertion EXAM: ABDOMEN - 1 VIEW COMPARISON:  08/18/2020 FINDINGS: Right groin Vas-Cath in place with the tip in the upper pelvis. Nonobstructive bowel gas pattern. No organomegaly or free air. Prior cholecystectomy. IMPRESSION: Right groin Vas-Cath in the upper pelvis. No acute findings. Electronically Signed   By: Charlett Nose M.D.   On: 01/21/2023  20:19     Medications:     Chlorhexidine Gluconate Cloth  6 each Topical Daily   cholecalciferol  1,000 Units Oral Q breakfast   clopidogrel  75 mg Oral Daily   ferrous sulfate  325 mg Oral BID WC   heparin  5,000 Units Subcutaneous Q8H   insulin aspart  0-5 Units Subcutaneous QHS   insulin aspart  0-9 Units Subcutaneous TID WC   [START ON 01/23/2023] levothyroxine  100 mcg Oral QAC breakfast   lidocaine (PF)  10 mL Infiltration Once   loratadine  10 mg Oral Daily   midodrine  5 mg Oral TID WC   multivitamin  1 tablet Oral Daily   omega-3 acid ethyl esters  1 g Oral Daily   polyethylene glycol  17 g Oral Daily   rosuvastatin  40 mg Oral Daily   senna-docusate  2 tablet Oral BID   acetaminophen, fluticasone, HYDROcodone-acetaminophen, lidocaine-prilocaine, magnesium hydroxide, nitroGLYCERIN, ondansetron **OR** ondansetron (ZOFRAN) IV, traZODone  Assessment/ Plan:  Mr. KEONTAE EBSEN is a 89 y.o.  male with end stage renal disease on hemodialysis MWF, right hip fracture, chronic orthostatic hypotension, atrial fibrillation not on anticoagulation, diabetes mellitus type II who presents to [facility] for ESRD on hemodialysis (HCC) [N18.6, Z99.2] Problem with dialysis access, initial encounter (HCC) [T82.898A] AV fistula occlusion, initial encounter (HCC) [T82.898A] Hemodialysis AV fistula thrombosis (HCC) [T82.868A]  [facility] MWF [address] 92.5kg   1.  ESRD on HD MWF.  With complication of dialysis device.   - Appreciate vascular input placing an HD temp cath  - Will plan for dialysis later today.   2.  Hypotension with chronic kidney disease  - midodrine ordered with HD treatments.    3.  Anemia chronic kidney disease: hemoglobin 8.0. Patient prescribed mircera outpatient.    4.  Secondary hyperparathyroidism.    - not currently on phosphate binders or vitamin D agents.    - Calcium acceptable  - Will monitor bone minerals    LOS: 0   8/31/20244:16 PM

## 2023-01-22 NOTE — Progress Notes (Signed)
Hemodialysis note  Received patient in bed to unit. Alert and oriented.  Informed consent signed and in chart.  Treatment initiated: 1425 Treatment completed: 1759  Patient tolerated well. Transported back to room, alert without acute distress.  Report given to patient's RN.   Access used: right femoral HD Catheter Access issues: none  Total UF removed: 1.7L Medication(s) given:  Midodrine 5 mg tab PO  Post HD weight: 93.5 kg   Wolfgang Phoenix [facility]

## 2023-01-22 NOTE — Plan of Care (Signed)
Patient denies complaints and reports his pain from the hip is improving with medication. Patient to HD this afternoon. VSS and patient remains free of injury or infection. MD will continue to monitor for discharge planning. PT eval done and PT recommends transfer and pivot at this time due to patient having a femoral line for HD. Nursing will continue to monitor plan of care.

## 2023-01-22 NOTE — Progress Notes (Signed)
VS done by [employer] RN

## 2023-01-22 NOTE — Evaluation (Signed)
Physical Therapy Evaluation Patient Details Name: Matthew [name] MRN: [id number] DOB: 14-Aug-1930 Today's Date: 01/22/2023  History of Present Illness  Pt is 89 that presented to hospital from SNF due to L AVF occulsion, temp cath placed 01/21/23. Per chart pt is s/p R THA on 01/12/23. PMH: Anginal pain, arthritis, AAA, A fib, bradycardia, CAD, ESRD, GERD, HFrEF, HLD, HTN, IDA, MI, S/p CABG, squamous cell carcinoma, DM II, valvular insufficiency.   Clinical Impression  Pt alert, agreeable to PT denied pain initially but reported 3/10 at end of session. Per pt/family, he was recently at [facility] and was working on transfers and ambulation. The pt was able to recall 2/3 posterior hip precautions, educated on hip IR and positioning in bed to avoid this motion. Session limited due to temp HD cath; exercises performed at bed level, AAROM from RLE due to pain. Post session nephrology agreeable for pt to stand pivot to recliner after HD today moving forward, care team updated.  Overall the patient demonstrated deficits (see "PT Problem List") that impede the patient's functional abilities, safety, and mobility and would benefit from skilled PT intervention.          If plan is discharge home, recommend the following: Two people to help with walking and/or transfers;Help with stairs or ramp for entrance;Assist for transportation;Assistance with cooking/housework   Can travel by private vehicle   No    Equipment Recommendations Other (comment) (TBD at next venue of care)  Recommendations for Other Services  OT consult    Functional Status Assessment Patient has had a recent decline in their functional status and demonstrates the ability to make significant improvements in function in a reasonable and predictable amount of time.     Precautions / Restrictions Precautions Precautions: Fall;Posterior Hip Precaution Comments: recalls 2/3 today; Needed reminder for no internal rotation of the R  hip Restrictions Weight Bearing Restrictions: Yes RLE Weight Bearing: Partial weight bearing RLE Partial Weight Bearing Percentage or Pounds: 50% Other Position/Activity Restrictions: pt reporting 30% weight bearing on RLE      Mobility  Bed Mobility               General bed mobility comments: deferred due to temp HD cath    Transfers                        Ambulation/Gait                  Stairs            Wheelchair Mobility     Tilt Bed    Modified Rankin (Stroke Patients Only)       Balance                                             Pertinent Vitals/Pain Pain Assessment Pain Assessment: 0-10 Pain Score: 3  Pain Location: RLE (hip/knee) Pain Descriptors / Indicators: Sore, Discomfort, Grimacing, Operative site guarding, Moaning Pain Intervention(s): Monitored during session, Patient requesting pain meds-RN notified    Home Living Family/patient expects to be discharged to:: Private residence Living Arrangements: Children Available Help at Discharge: Family;Available PRN/intermittently Type of Home: House Home Access: Stairs to enter Entrance Stairs-Rails: Doctor, general practice of Steps: 7 total (3 at driveway and 4 at doorway)   Home Layout: Able to live on main level with  bedroom/bathroom;Two level Home Equipment: Cane - single point;Rollator (4 wheels);Shower seat;Grab bars - toilet;Grab bars - tub/shower;Wheelchair - manual Additional Comments: Lives with daughter and son-in-law.    Prior Function Prior Level of Function : Independent/Modified Independent             Mobility Comments: uses rollator for ambulation as needed; not driving; ADLs Comments: daughter does cooking/cleaning; he is Mod I for bathing/dressing;     Extremity/Trunk Assessment   Upper Extremity Assessment Upper Extremity Assessment: Defer to OT evaluation    Lower Extremity Assessment Lower Extremity  Assessment: Generalized weakness (s/p R hip surgery, limited by pain)       Communication   Communication Communication: No apparent difficulties  Cognition Arousal: Alert Behavior During Therapy: WFL for tasks assessed/performed Overall Cognitive Status: Within Functional Limits for tasks assessed                                 General Comments: pt oriented to self, situation, location. Pt recalls 2/3 posterior hip precs        General Comments      Exercises General Exercises - Lower Extremity Ankle Circles/Pumps: AROM, Both, 20 reps Short Arc Quad: AROM, Strengthening, Both, 15 reps Heel Slides: AROM, Left, 20 reps, AAROM, Right, Strengthening Hip ABduction/ADduction: AAROM, Strengthening, Right, 20 reps, AROM, Left   Assessment/Plan    PT Assessment Patient needs continued PT services  PT Problem List Decreased strength;Decreased range of motion;Decreased activity tolerance;Decreased balance;Decreased mobility;Decreased knowledge of precautions;Decreased knowledge of use of DME       PT Treatment Interventions DME instruction;Neuromuscular re-education;Gait training;Stair training;Patient/family education;Functional mobility training;Therapeutic activities;Therapeutic exercise;Balance training    PT Goals (Current goals can be found in the Care Plan section)  Acute Rehab PT Goals Patient Stated Goal: to return to PLOF Time For Goal Achievement: 02/05/23 Potential to Achieve Goals: Good    Frequency Min 1X/week     Co-evaluation               AM-PAC PT "6 Clicks" Mobility  Outcome Measure Help needed turning from your back to your side while in a flat bed without using bedrails?: A Lot Help needed moving from lying on your back to sitting on the side of a flat bed without using bedrails?: A Lot Help needed moving to and from a bed to a chair (including a wheelchair)?: A Lot Help needed standing up from a chair using your arms (e.g.,  wheelchair or bedside chair)?: A Lot Help needed to walk in hospital room?: A Lot Help needed climbing 3-5 steps with a railing? : Total 6 Click Score: 11    End of Session   Activity Tolerance: Patient tolerated treatment well Patient left: in bed;with call bell/phone within reach;with bed alarm set;with family/visitor present Nurse Communication: Mobility status;Weight bearing status PT Visit Diagnosis: Other abnormalities of gait and mobility (R26.89);Difficulty in walking, not elsewhere classified (R26.2);Pain;Muscle weakness (generalized) (M62.81) Pain - Right/Left: Right Pain - part of body: Hip    Time: 1610-9604 PT Time Calculation (min) (ACUTE ONLY): 20 min   Charges:   PT Evaluation $PT Eval Low Complexity: 1 Low PT Treatments $Therapeutic Exercise: 8-22 mins PT General Charges $$ ACUTE PT VISIT: 1 Visit        Olga Coaster PT, DPT 10:44 AM,01/22/23

## 2023-01-22 NOTE — Progress Notes (Signed)
Matthew [name]  VHQ:[id number] DOB: 05-02-1931 DOA: 01/21/2023 PCP: Sherron Monday, MD    Brief Narrative:  89 year old with a history of chronic systolic CHF, ESRD on HD, chronic atrial fibrillation not on anticoagulation due to high fall risk, AAA, HLD, HTN, DM2, and admission to [facility] 8/19-8/26 due to migration of a hip nail after a hip surgery with subsequent discharge to SNF for rehab who presented to the ER 8/30 with the acute onset of left arm AV fistula occlusion which was discovered when he presented to his dialysis center for HD.  He denied other symptoms.  Goals of Care:   Code Status: Limited: Do not attempt resuscitation (DNR) -DNR-LIMITED -Do Not Intubate/DNI    DVT prophylaxis: heparin injection 5,000 Units Start: 01/21/23 1730  Interim Hx: Afebrile.  Vital signs stable.  No acute events recorded overnight.  Resting comfortable in his hospital bed with no new complaints.  Tolerated insertion of right groin HD catheter without complication.  No questions at this time.  Assessment & Plan:  L brachio-axillary AV graft occlusion Care per Vascular Surgery - s/p placement of a right common femoral vein dialysis catheter 8/30 -definitive management of AV graft occlusion per Vascular Surgery  ESRD on HD MWF Care as per Nephrology  Chronic hypotension Continue usual midodrine dosing with HD treatments -blood pressure stable at this time  Chronic paroxysmal atrial fibrillation Not on chronic anticoagulation due to high fall risk -off sotalol due to bradycardia per Cardiology -heart rate controlled presently  Anemia of chronic kidney disease Care per Nephrology  DM2 CBG presently well-controlled  Chronic systolic CHF No gross volume overload at present -volume management per dialysis  HLD Continue usual home medications  Family Communication: Spoke with daughter at bedside Disposition: Anticipate discharge home when plan in place to assure for outpatient  dialysis   Objective: Blood pressure 127/70, pulse 88, temperature 97.7 F (36.5 C), resp. rate (!) 25, height 6\' 1"  (1.854 m), weight 95.5 kg, SpO2 100%. No intake or output data in the 24 hours ending 01/22/23 0901 Filed Weights   01/21/23 1207  Weight: 95.5 kg    Examination: General: No acute respiratory distress Lungs: Clear to auscultation bilaterally without wheezes or crackles Cardiovascular: Regular rate and rhythm without murmur gallop or rub normal S1 and S2 Abdomen: Nontender, nondistended, soft, bowel sounds positive, no rebound, no ascites, no appreciable mass -right groin hemodialysis catheter in place with no significant hematoma and no induration of hip Extremities: No significant cyanosis, clubbing, or edema bilateral lower extremities  CBC: Recent Labs  Lab 01/16/23 0327 01/21/23 1400 01/22/23 0404  WBC 9.0 10.3 8.6  NEUTROABS  --  8.0*  --   HGB 8.6* 8.4* 8.0*  HCT 27.1* 26.8* 24.9*  MCV 97.8 99.3 98.8  PLT 148* 173 181   Basic Metabolic Panel: Recent Labs  Lab 01/16/23 0327 01/21/23 1400 01/22/23 0404  NA 130* 135 134*  K 4.1 3.8 4.0  CL 93* 98 93*  CO2 28 29 28   GLUCOSE 162* 172* 134*  BUN 65* 58* 65*  CREATININE 3.81* 3.90* 4.72*  CALCIUM 8.3* 8.3* 8.3*   GFR: Estimated Creatinine Clearance: 11.8 mL/min (A) (by C-G formula based on SCr of 4.72 mg/dL (H)).   Scheduled Meds:  Chlorhexidine Gluconate Cloth  6 each Topical Daily   cholecalciferol  1,000 Units Oral Q breakfast   clopidogrel  75 mg Oral Daily   ferrous sulfate  325 mg Oral BID WC   heparin  5,000 Units Subcutaneous Q8H   insulin aspart  0-5 Units Subcutaneous QHS   insulin aspart  0-9 Units Subcutaneous TID WC   lidocaine (PF)  10 mL Infiltration Once   loratadine  10 mg Oral Daily   midodrine  5 mg Oral TID WC   multivitamin  1 tablet Oral Daily   omega-3 acid ethyl esters  1 g Oral Daily   rosuvastatin  40 mg Oral Daily      LOS: 0 days   Lonia Blood,  MD [facility] Office  [phone] Pager - Text Page per Loretha Stapler  If 7PM-7AM, please contact night-coverage per Amion 01/22/2023, 9:01 AM

## 2023-01-23 DIAGNOSIS — T82898A Other specified complication of vascular prosthetic devices, implants and grafts, initial encounter: Secondary | ICD-10-CM | POA: Diagnosis not present

## 2023-01-23 LAB — RENAL FUNCTION PANEL
Albumin: 2.4 g/dL — ABNORMAL LOW (ref 3.5–5.0)
Anion gap: 10 (ref 5–15)
BUN: 39 mg/dL — ABNORMAL HIGH (ref 8–23)
CO2: 27 mmol/L (ref 22–32)
Calcium: 8.1 mg/dL — ABNORMAL LOW (ref 8.9–10.3)
Chloride: 94 mmol/L — ABNORMAL LOW (ref 98–111)
Creatinine, Ser: 3.3 mg/dL — ABNORMAL HIGH (ref 0.61–1.24)
GFR, Estimated: 17 mL/min — ABNORMAL LOW (ref >60)
Glucose, Bld: 142 mg/dL — ABNORMAL HIGH (ref 70–99)
Phosphorus: 3.9 mg/dL (ref 2.5–4.6)
Potassium: 3.8 mmol/L (ref 3.5–5.1)
Sodium: 131 mmol/L — ABNORMAL LOW (ref 135–145)

## 2023-01-23 LAB — CBC
HCT: 24 % — ABNORMAL LOW (ref 39.0–52.0)
Hemoglobin: 7.8 g/dL — ABNORMAL LOW (ref 13.0–17.0)
MCH: 31.3 pg (ref 26.0–34.0)
MCHC: 32.5 g/dL (ref 30.0–36.0)
MCV: 96.4 fL (ref 80.0–100.0)
Platelets: 187 10*3/uL (ref 150–400)
RBC: 2.49 MIL/uL — ABNORMAL LOW (ref 4.22–5.81)
RDW: 15.8 % — ABNORMAL HIGH (ref 11.5–15.5)
WBC: 9 10*3/uL (ref 4.0–10.5)
nRBC: 0 % (ref 0.0–0.2)

## 2023-01-23 LAB — GLUCOSE, CAPILLARY
Glucose-Capillary: 146 mg/dL — ABNORMAL HIGH (ref 70–99)
Glucose-Capillary: 155 mg/dL — ABNORMAL HIGH (ref 70–99)
Glucose-Capillary: 138 mg/dL — ABNORMAL HIGH (ref 70–99)

## 2023-01-23 NOTE — TOC Progression Note (Signed)
Transition of Care [facility]) - Progression Note    Patient Details  Name: Matthew [name] MRN: [id number] Date of Birth: 03-14-1931  Transition of Care [facility]) CM/SW Contact  Bing Quarry, RN Phone Number: 01/23/2023, 2:58 PM  Clinical Narrative: 9/1: From [facility]. Admitted 8/30 Htx of ESRD on HD MWF at [facility] for occluded AV fistula/issue during HD. Replaced on 01/21/23 by vascular. Had been at [facility] 01/08/23 to 01/10/23 and then transferred  to [facility]. Was discharged from [facility] on 8/26 after total hip replacement surgery from IM nailing issues from procedure several months. Went to [facility] till this current admission.  HD MWF at [facility] from prior admission completed 01/14/23.  PASRR #[id number]   TOC to follow through disposition plan to return to [facility] as of this time.   NOTE: Procedure Tuesday for thrombectomy of access, NPO Monday night.   Gabriel Cirri MSN RN CM  Transitions of Care Department [facility] [phone] Weekends Only          Expected Discharge Plan and Services                                               Social Determinants of Health (SDOH) Interventions SDOH Screenings   Food Insecurity: No Food Insecurity (01/21/2023)  Housing: Low Risk  (01/21/2023)  Transportation Needs: No Transportation Needs (01/21/2023)  Utilities: Not At Risk (01/21/2023)  Depression (PHQ2-9): Low Risk  (09/21/2022)  Financial Resource Strain: Low Risk  (01/10/2018)  Physical Activity: Inactive (01/10/2018)  Social Connections: Socially Integrated (01/10/2018)  Stress: No Stress Concern Present (01/10/2018)  Tobacco Use: Medium Risk (01/22/2023)    Readmission Risk Interventions    01/09/2023    4:42 PM 02/12/2021    3:28 PM  Readmission Risk Prevention Plan  Transportation Screening Complete Complete  PCP or Specialist Appt within 3-5 Days Complete   HRI or Home Care Consult Complete   Social Work Consult for  Recovery Care Planning/Counseling Complete   Palliative Care Screening Not Applicable   Medication Review [employer]) Complete Complete  SW Recovery Care/Counseling Consult  Complete  Skilled Nursing Facility  Complete

## 2023-01-23 NOTE — Progress Notes (Signed)
[facility]  ROUNDING NOTE   Subjective:   [name] was admitted to [facility] on 01/21/2023 for ESRD on hemodialysis [facility]) [N18.6, Z99.2] Problem with dialysis access, initial encounter [facility]) [T82.898A] AV fistula occlusion, initial encounter (HCC) [T82.898A] Hemodialysis AV fistula thrombosis (HCC) [A54.098J]  Patient seen laying in bed Recently discharged from rehab Denies pain or discomfort from femoral line  Objective:  Vital signs in last 24 hours:  Temp:  [97.8 F (36.6 C)-98.8 F (37.1 C)] 98 F (36.7 C) (09/01 0955) Pulse Rate:  [79-98] 82 (09/01 0955) Resp:  [13-19] 18 (09/01 0955) BP: (88-126)/(52-82) 93/52 (09/01 0955) SpO2:  [96 %-100 %] 98 % (09/01 0955) Weight:  [93.5 kg-96.1 kg] 93.5 kg (08/31 1808)  Weight change: 0.6 kg Filed Weights   01/21/23 1207 01/22/23 1425 01/22/23 1808  Weight: 95.5 kg 96.1 kg 93.5 kg    Intake/Output: I/O last 3 completed shifts: In: 240 [P.O.:240] Out: 1700 [Other:1700]   Intake/Output this shift:  Total I/O In: 240 [P.O.:240] Out: -   Physical Exam: General: NAD, laying in stretcher  Head: Normocephalic, atraumatic. Moist oral mucosal membranes  Eyes: Anicteric  Lungs:  Clear to auscultation  Heart: Regular rate and rhythm  Abdomen:  Soft, nontender,   Extremities:  ++ peripheral edema.  Neurologic: Nonfocal, moving all four extremities  Skin: No lesions  Access: Left AVF with no bruit or thrill, rt femoral temp cath placed on 01/21/23    Basic Metabolic Panel: Recent Labs  Lab 01/21/23 1400 01/22/23 0404 01/23/23 0644  NA 135 134* 131*  K 3.8 4.0 3.8  CL 98 93* 94*  CO2 29 28 27   GLUCOSE 172* 134* 142*  BUN 58* 65* 39*  CREATININE 3.90* 4.72* 3.30*  CALCIUM 8.3* 8.3* 8.1*  PHOS  --   --  3.9    Liver Function Tests: Recent Labs  Lab 01/21/23 1400 01/23/23 0644  AST 21  --   ALT <5  --   ALKPHOS 145*  --   BILITOT 0.9  --   PROT 6.2*  --   ALBUMIN 2.7* 2.4*   No results  for input(s): "LIPASE", "AMYLASE" in the last 168 hours. No results for input(s): "AMMONIA" in the last 168 hours.  CBC: Recent Labs  Lab 01/21/23 1400 01/22/23 0404 01/23/23 0644  WBC 10.3 8.6 9.0  NEUTROABS 8.0*  --   --   HGB 8.4* 8.0* 7.8*  HCT 26.8* 24.9* 24.0*  MCV 99.3 98.8 96.4  PLT 173 181 187    Cardiac Enzymes: No results for input(s): "CKTOTAL", "CKMB", "CKMBINDEX", "TROPONINI" in the last 168 hours.  BNP: Invalid input(s): "POCBNP"  CBG: Recent Labs  Lab 01/22/23 0941 01/22/23 1156 01/22/23 1840 01/22/23 2127 01/23/23 0748  GLUCAP 171* 157* 168* 131* 146*    Microbiology: Results for orders placed or performed during the hospital encounter of 01/21/23  MRSA Next Gen by PCR, Nasal     Status: None   Collection Time: 01/22/23  6:33 AM   Specimen: Nasal Mucosa; Nasal Swab  Result Value Ref Range Status   MRSA by PCR Next Gen NOT DETECTED NOT DETECTED Final    Comment: (NOTE) The GeneXpert MRSA Assay (FDA approved for NASAL specimens only), is one component of a comprehensive MRSA colonization surveillance program. It is not intended to diagnose MRSA infection nor to guide or monitor treatment for MRSA infections. Test performance is not FDA approved in patients less than 91 years old. Performed at [facility],  [address], [place], [place] [address]     Coagulation Studies: No results for input(s): "LABPROT", "INR" in the last 72 hours.  Urinalysis: No results for input(s): "COLORURINE", "LABSPEC", "PHURINE", "GLUCOSEU", "HGBUR", "BILIRUBINUR", "KETONESUR", "PROTEINUR", "UROBILINOGEN", "NITRITE", "LEUKOCYTESUR" in the last 72 hours.  Invalid input(s): "APPERANCEUR"    Imaging: DG Abdomen 1 View  Result Date: 01/21/2023 CLINICAL DATA:  Post Vas-Cath insertion EXAM: ABDOMEN - 1 VIEW COMPARISON:  08/18/2020 FINDINGS: Right groin Vas-Cath in place with the tip in the upper pelvis. Nonobstructive bowel gas pattern. No organomegaly or  free air. Prior cholecystectomy. IMPRESSION: Right groin Vas-Cath in the upper pelvis. No acute findings. Electronically Signed   By: Charlett Nose M.D.   On: 01/21/2023 20:19     Medications:     Chlorhexidine Gluconate Cloth  6 each Topical Daily   cholecalciferol  1,000 Units Oral Q breakfast   clopidogrel  75 mg Oral Daily   ferrous sulfate  325 mg Oral BID WC   heparin  5,000 Units Subcutaneous Q8H   insulin aspart  0-5 Units Subcutaneous QHS   insulin aspart  0-9 Units Subcutaneous TID WC   levothyroxine  100 mcg Oral QAC breakfast   loratadine  10 mg Oral Daily   midodrine  5 mg Oral TID WC   multivitamin  1 tablet Oral Daily   omega-3 acid ethyl esters  1 g Oral Daily   polyethylene glycol  17 g Oral Daily   rosuvastatin  40 mg Oral Daily   senna-docusate  2 tablet Oral BID   acetaminophen, fluticasone, HYDROcodone-acetaminophen, lidocaine-prilocaine, magnesium hydroxide, nitroGLYCERIN, ondansetron **OR** ondansetron (ZOFRAN) IV, traZODone  Assessment/ Plan:  [name] is a 89 y.o.  male with end stage renal disease on hemodialysis MWF, right hip fracture, chronic orthostatic hypotension, atrial fibrillation not on anticoagulation, diabetes mellitus type II who presents to [facility] for ESRD on hemodialysis (HCC) [N18.6, Z99.2] Problem with dialysis access, initial encounter (HCC) [T82.898A] AV fistula occlusion, initial encounter (HCC) [T82.898A] Hemodialysis AV fistula thrombosis (HCC) [T82.868A]  [facility] MWF [address] 92.5kg   1.  ESRD on HD MWF.  With complication of dialysis access..   - Appreciate vascular input placing an HD temp cath  -Next hemodialysis planned for Monday  -Possibly declot or PermCath placement on Tuesday.   2.  Chronic hypotension with peripheral edema  -Fluid removal is limited by low blood pressure  - midodrine ordered with HD treatments.    3.  Anemia chronic kidney disease: hemoglobin 7.8. Patient prescribed mircera outpatient.    -Continue low-dose EPO with HD    4.  Secondary hyperparathyroidism.    - not currently on phosphate binders or vitamin D agents.    - Calcium acceptable  - Will monitor bone minerals    LOS: 1 Ari Engelbrecht 9/1/202411:14 AM

## 2023-01-23 NOTE — Plan of Care (Signed)

## 2023-01-23 NOTE — Progress Notes (Signed)
 Vein and Vascular Surgery  Daily Progress Note   Subjective  -   No complaints  Objective Vitals:   01/22/23 1843 01/22/23 2010 01/23/23 0542 01/23/23 0955  BP: (!) 93/55 124/66 126/82 (!) 93/52  Pulse: 89 92 96 82  Resp: 15 18 18 18   Temp: 97.8 F (36.6 C) 98.2 F (36.8 C) 98.8 F (37.1 C) 98 F (36.7 C)  TempSrc:      SpO2: 97% 100% 98% 98%  Weight:      Height:        Intake/Output Summary (Last 24 hours) at 01/23/2023 1350 Last data filed at 01/23/2023 1100 Gross per 24 hour  Intake 240 ml  Output 1800 ml  Net -1560 ml    PULM  CTAB CV  RRR VASC  Right femoral Temporary HD catheter site is clean and dry with intact dressing.  Worked well for HD yesterday and plans per Nephrology are HD tomorrow.  Will plan for thrombectomy of access on Tuesday.  NPO on Monday evening for Tuesday procedure.  Laboratory CBC    Component Value Date/Time   WBC 9.0 01/23/2023 0644   HGB 7.8 (L) 01/23/2023 0644   HGB 12.1 (L) 09/16/2022 1003   HCT 24.0 (L) 01/23/2023 0644   HCT 36.9 (L) 09/16/2022 1003   PLT 187 01/23/2023 0644   PLT 115 (L) 09/16/2022 1003    BMET    Component Value Date/Time   NA 131 (L) 01/23/2023 0644   NA 140 09/16/2022 1003   K 3.8 01/23/2023 0644   CL 94 (L) 01/23/2023 0644   CO2 27 01/23/2023 0644   GLUCOSE 142 (H) 01/23/2023 0644   BUN 39 (H) 01/23/2023 0644   BUN 50 (H) 09/16/2022 1003   CREATININE 3.30 (H) 01/23/2023 0644   CALCIUM 8.1 (L) 01/23/2023 0644   GFRNONAA 17 (L) 01/23/2023 0644   GFRAA 25 (L) 05/26/2019 0503    Assessment/Planning: POD #2 s/p right femoral temp cath  NPO on Monday evening for OR Tuesday in the Vascular Angio Suite   Iline Oven  01/23/2023, 1:50 PM

## 2023-01-23 NOTE — Progress Notes (Signed)
Matthew [name]  CBJ:[id number] DOB: 1930/11/23 DOA: 01/21/2023 PCP: Sherron Monday, MD    Brief Narrative:  89 year old with a history of chronic systolic CHF, ESRD on HD, chronic atrial fibrillation not on anticoagulation due to high fall risk, AAA, HLD, HTN, DM2, and admission to [facility] 8/19-8/26 due to migration of a hip nail after a hip surgery with subsequent discharge to SNF for rehab who presented to the ER 8/30 with the acute onset of left arm AV fistula occlusion which was discovered when he presented to his dialysis center for HD.  He denied other symptoms.  Goals of Care:   Code Status: Limited: Do not attempt resuscitation (DNR) -DNR-LIMITED -Do Not Intubate/DNI    DVT prophylaxis: heparin injection 5,000 Units Start: 01/21/23 1730  Interim Hx: No acute events recorded overnight.  Afebrile.  Vital signs stable.  CBG well-controlled.  Successfully completed hemodialysis via his temporary right groin catheter 8/31.  Resting comfortably in bed at the time of visit.  Alert and oriented with no new complaints.  Dressings were removed from his right lateral thigh and lower extremity revealing surgical wounds that were both well-healed with no evidence of erythema or purulent discharge.  Assessment & Plan:  L brachio-axillary AV graft occlusion Care per Vascular Surgery - s/p placement of a right common femoral vein dialysis catheter 8/30 - definitive management of AV graft occlusion per Vascular Surgery early this week  ESRD on HD MWF Care as per Nephrology - underwent successful HD 8/31 via new temporary catheter  Chronic hypotension Continue usual midodrine dosing with HD treatments -blood pressure stable  Chronic paroxysmal atrial fibrillation Not on chronic anticoagulation due to high fall risk -off sotalol due to bradycardia per Cardiology -heart rate controlled presently  Anemia of chronic kidney disease Care per Nephrology -hemoglobin stable presently  DM2 CBG  well-controlled  Chronic systolic CHF No gross volume overload at present - volume management per dialysis  HLD Continue usual home medications  Family Communication: No family present at time of exam Disposition: Anticipate discharge home when plan in place to assure for outpatient dialysis   Objective: Blood pressure 126/82, pulse 96, temperature 98.8 F (37.1 C), resp. rate 18, height 6\' 1"  (1.854 m), weight 93.5 kg, SpO2 98%.  Intake/Output Summary (Last 24 hours) at 01/23/2023 0827 Last data filed at 01/22/2023 1759 Gross per 24 hour  Intake 240 ml  Output 1700 ml  Net -1460 ml   Filed Weights   01/21/23 1207 01/22/23 1425 01/22/23 1808  Weight: 95.5 kg 96.1 kg 93.5 kg    Examination: General: No acute respiratory distress Lungs: CTA B without wheeze Cardiovascular: RRR without murmur Abdomen: NT/ND, soft, BS positive-right groin hemodialysis catheter in place with no significant hematoma and no induration of hip Extremities: No significant cyanosis, clubbing, or edema BLE  CBC: Recent Labs  Lab 01/21/23 1400 01/22/23 0404 01/23/23 0644  WBC 10.3 8.6 9.0  NEUTROABS 8.0*  --   --   HGB 8.4* 8.0* 7.8*  HCT 26.8* 24.9* 24.0*  MCV 99.3 98.8 96.4  PLT 173 181 187   Basic Metabolic Panel: Recent Labs  Lab 01/21/23 1400 01/22/23 0404 01/23/23 0644  NA 135 134* 131*  K 3.8 4.0 3.8  CL 98 93* 94*  CO2 29 28 27   GLUCOSE 172* 134* 142*  BUN 58* 65* 39*  CREATININE 3.90* 4.72* 3.30*  CALCIUM 8.3* 8.3* 8.1*  PHOS  --   --  3.9   GFR: Estimated Creatinine  Clearance: 16.8 mL/min (A) (by C-G formula based on SCr of 3.3 mg/dL (H)).   Scheduled Meds:  Chlorhexidine Gluconate Cloth  6 each Topical Daily   cholecalciferol  1,000 Units Oral Q breakfast   clopidogrel  75 mg Oral Daily   ferrous sulfate  325 mg Oral BID WC   heparin  5,000 Units Subcutaneous Q8H   insulin aspart  0-5 Units Subcutaneous QHS   insulin aspart  0-9 Units Subcutaneous TID WC    levothyroxine  100 mcg Oral QAC breakfast   lidocaine (PF)  10 mL Infiltration Once   loratadine  10 mg Oral Daily   midodrine  5 mg Oral TID WC   multivitamin  1 tablet Oral Daily   omega-3 acid ethyl esters  1 g Oral Daily   polyethylene glycol  17 g Oral Daily   rosuvastatin  40 mg Oral Daily   senna-docusate  2 tablet Oral BID      LOS: 1 day   Lonia Blood, MD [facility] Office  [phone] Pager - Text Page per Amion  If 7PM-7AM, please contact night-coverage per Amion 01/23/2023, 8:27 AM

## 2023-01-24 DIAGNOSIS — T82898A Other specified complication of vascular prosthetic devices, implants and grafts, initial encounter: Secondary | ICD-10-CM | POA: Diagnosis not present

## 2023-01-24 LAB — GLUCOSE, CAPILLARY
Glucose-Capillary: 138 mg/dL — ABNORMAL HIGH (ref 70–99)
Glucose-Capillary: 148 mg/dL — ABNORMAL HIGH (ref 70–99)
Glucose-Capillary: 115 mg/dL — ABNORMAL HIGH (ref 70–99)

## 2023-01-24 MED ORDER — EPOETIN ALFA 10000 UNIT/ML IJ SOLN
4000.0000 [IU] | INTRAMUSCULAR | Status: DC
  Administered 2023-01-26: 4000 [IU] via INTRAVENOUS
  Filled 2023-01-24 (×2): qty 1

## 2023-01-24 MED ORDER — HEPARIN SODIUM (PORCINE) 1000 UNIT/ML IJ SOLN
INTRAMUSCULAR | Status: AC
  Filled 2023-01-24: qty 10

## 2023-01-24 NOTE — Progress Notes (Signed)
  Progress Note   Patient: Matthew [name] VOJ:[id number] DOB: 16-Nov-1930 DOA: 01/21/2023     2 DOS: the patient was seen and examined on 01/24/2023   Brief hospital course:  89 year old with a history of chronic systolic CHF, ESRD on HD, chronic atrial fibrillation not on anticoagulation due to high fall risk, AAA, HLD, HTN, DM2, and admission to [facility] 8/19-8/26 due to migration of a hip nail after a hip surgery with subsequent discharge to SNF for rehab who presented to the ER 8/30 with the acute onset of left arm AV fistula occlusion which was discovered when he presented to his dialysis center for HD. He denied other symptoms.    Assessment and Plan:   L brachio-axillary AV graft occlusion Care per Vascular Surgery - s/p placement of a temporal right common femoral vein dialysis catheter 8/30  Patient has an occlusion of his left brachial axillary AV graft which has worked [employer] for 2.5 years and this is the first time it has failed. Plan is for thrombectomy of AV graft in a.m.   Diabetes mellitus ESRD on HD MWF Care as per Nephrology - underwent successful HD 8/31 via new temporary catheter Continue dialysis on his scheduled days M/W/F Maintain consistent carbohydrate diet    Chronic hypotension Continue usual midodrine dosing with HD treatments -blood pressure stable    Chronic paroxysmal atrial fibrillation Not on chronic anticoagulation due to high fall risk -off sotalol due to bradycardia per Cardiology -heart rate controlled presently    Anemia of chronic kidney disease H&H is stable     Chronic systolic CHF No gross volume overload at present - volume management per dialysis Last known LVEF of 35 to 40% with moderately decreased LV function and LV global hypokinesis. LV systolic function is mildly reduced and RV size is severely enlarged with moderately elevated pulmonary artery systolic pressure.     Hypothyroidism Continue Synthroid   Coronary  artery disease Continue Plavix and statins      Subjective: Patient is seen and examined in the dialysis unit.  He has no new complaints.  Physical Exam: Vitals:   01/24/23 1130 01/24/23 1135 01/24/23 1145 01/24/23 1208  BP: (!) 94/59 124/60  98/60  Pulse: (!) 106 (!) 110  92  Resp: 19 (!) 23  20  Temp:  (!) 97.5 F (36.4 C)  98.2 F (36.8 C)  TempSrc:  Oral    SpO2: 98% 100%  97%  Weight:   93.3 kg   Height:       General: No acute respiratory distress Lungs: CTA B without wheeze Cardiovascular: RRR without murmur Abdomen: NT/ND, soft, BS positive-right groin hemodialysis catheter in place with no significant hematoma and no induration of hip Extremities: No significant cyanosis, clubbing, or edema BLE   Data Reviewed:  There are no new results to review at this time.  Family Communication: Plan of care discussed with patient in detail.  He verbalizes understanding and agrees with the plan.  Disposition: Status is: Inpatient Remains inpatient appropriate because: For thrombectomy in a.m.  Planned Discharge Destination:  TBD    Time spent: 33 minutes  Author: Lucile Shutters, MD 01/24/2023 12:44 PM  For on call review [email or web address].

## 2023-01-24 NOTE — Progress Notes (Signed)
[facility]  ROUNDING NOTE   Subjective:   [name] was admitted to [facility] on 01/21/2023 for ESRD on hemodialysis [facility]) [N18.6, Z99.2] Problem with dialysis access, initial encounter [facility]) [T82.898A] AV fistula occlusion, initial encounter (HCC) [T82.898A] Hemodialysis AV fistula thrombosis (HCC) [Z30.865H]  Patient seen during HD Denies pain or discomfort from femoral line   HEMODIALYSIS FLOWSHEET:  Blood Flow Rate (mL/min): 399 mL/min Arterial Pressure (mmHg): -147.67 mmHg Venous Pressure (mmHg): 162.21 mmHg TMP (mmHg): 11.31 mmHg Ultrafiltration Rate (mL/min): 1012 mL/min Dialysate Flow Rate (mL/min): 299 ml/min     Objective:  Vital signs in last 24 hours:  Temp:  [97.6 F (36.4 C)-99.2 F (37.3 C)] 99.2 F (37.3 C) (09/02 0749) Pulse Rate:  [73-107] 106 (09/02 1030) Resp:  [17-20] 18 (09/02 1030) BP: (89-122)/(42-76) 95/58 (09/02 1030) SpO2:  [91 %-100 %] 100 % (09/02 1030) Weight:  [95.2 kg] 95.2 kg (09/02 0757)  Weight change:  Filed Weights   01/22/23 1425 01/22/23 1808 01/24/23 0757  Weight: 96.1 kg 93.5 kg 95.2 kg    Intake/Output: I/O last 3 completed shifts: In: 600 [P.O.:600] Out: 100 [Urine:100]   Intake/Output this shift:  No intake/output data recorded.  Physical Exam: General: NAD, laying in stretcher  Head: Normocephalic, atraumatic. Moist oral mucosal membranes  Eyes: Anicteric  Lungs:  Clear to auscultation  Heart: Regular rate and rhythm  Abdomen:  Soft, nontender,   Extremities:  ++ peripheral edema.  Neurologic: Nonfocal, moving all four extremities  Skin: No lesions  Access: Left AVF -no bruit or thrill, rt femoral temp cath placed on 01/21/23    Basic Metabolic Panel: Recent Labs  Lab 01/21/23 1400 01/22/23 0404 01/23/23 0644  NA 135 134* 131*  K 3.8 4.0 3.8  CL 98 93* 94*  CO2 29 28 27   GLUCOSE 172* 134* 142*  BUN 58* 65* 39*  CREATININE 3.90* 4.72* 3.30*  CALCIUM 8.3* 8.3* 8.1*  PHOS  --   --   3.9    Liver Function Tests: Recent Labs  Lab 01/21/23 1400 01/23/23 0644  AST 21  --   ALT <5  --   ALKPHOS 145*  --   BILITOT 0.9  --   PROT 6.2*  --   ALBUMIN 2.7* 2.4*   No results for input(s): "LIPASE", "AMYLASE" in the last 168 hours. No results for input(s): "AMMONIA" in the last 168 hours.  CBC: Recent Labs  Lab 01/21/23 1400 01/22/23 0404 01/23/23 0644  WBC 10.3 8.6 9.0  NEUTROABS 8.0*  --   --   HGB 8.4* 8.0* 7.8*  HCT 26.8* 24.9* 24.0*  MCV 99.3 98.8 96.4  PLT 173 181 187    Cardiac Enzymes: No results for input(s): "CKTOTAL", "CKMB", "CKMBINDEX", "TROPONINI" in the last 168 hours.  BNP: Invalid input(s): "POCBNP"  CBG: Recent Labs  Lab 01/22/23 1840 01/22/23 2127 01/23/23 0748 01/23/23 1149 01/23/23 1705  GLUCAP 168* 131* 146* 155* 138*    Microbiology: Results for orders placed or performed during the hospital encounter of 01/21/23  MRSA Next Gen by PCR, Nasal     Status: None   Collection Time: 01/22/23  6:33 AM   Specimen: Nasal Mucosa; Nasal Swab  Result Value Ref Range Status   MRSA by PCR Next Gen NOT DETECTED NOT DETECTED Final    Comment: (NOTE) The GeneXpert MRSA Assay (FDA approved for NASAL specimens only), is one component of a comprehensive MRSA colonization surveillance program. It is not intended to diagnose MRSA infection  nor to guide or monitor treatment for MRSA infections. Test performance is not FDA approved in patients less than 51 years old. Performed at [facility], [address] [place], [place] [address]     Coagulation Studies: No results for input(s): "LABPROT", "INR" in the last 72 hours.  Urinalysis: No results for input(s): "COLORURINE", "LABSPEC", "PHURINE", "GLUCOSEU", "HGBUR", "BILIRUBINUR", "KETONESUR", "PROTEINUR", "UROBILINOGEN", "NITRITE", "LEUKOCYTESUR" in the last 72 hours.  Invalid input(s): "APPERANCEUR"    Imaging: No results found.   Medications:     Chlorhexidine  Gluconate Cloth  6 each Topical Daily   cholecalciferol  1,000 Units Oral Q breakfast   clopidogrel  75 mg Oral Daily   ferrous sulfate  325 mg Oral BID WC   heparin  5,000 Units Subcutaneous Q8H   insulin aspart  0-5 Units Subcutaneous QHS   insulin aspart  0-9 Units Subcutaneous TID WC   levothyroxine  100 mcg Oral QAC breakfast   loratadine  10 mg Oral Daily   midodrine  5 mg Oral TID WC   multivitamin  1 tablet Oral Daily   omega-3 acid ethyl esters  1 g Oral Daily   polyethylene glycol  17 g Oral Daily   rosuvastatin  40 mg Oral Daily   senna-docusate  2 tablet Oral BID   acetaminophen, fluticasone, HYDROcodone-acetaminophen, lidocaine-prilocaine, magnesium hydroxide, nitroGLYCERIN, ondansetron **OR** ondansetron (ZOFRAN) IV, traZODone  Assessment/ Plan:  [name] is a 89 y.o.  male with end stage renal disease on hemodialysis MWF, right hip fracture, chronic orthostatic hypotension, atrial fibrillation not on anticoagulation, diabetes mellitus type II who presents to [facility] for ESRD on hemodialysis (HCC) [N18.6, Z99.2] Problem with dialysis access, initial encounter (HCC) [T82.898A] AV fistula occlusion, initial encounter (HCC) [T82.898A] Hemodialysis AV fistula thrombosis (HCC) [T82.868A]  [facility] MWF [address] 92.5kg   1.  ESRD on HD MWF.  With complication of dialysis access..   - Appreciate vascular input placing an HD temp cath  -Patient seen during dialysis, Tolerating well   -Possibly declot or PermCath placement on Tuesday.will place NPO   2.  Chronic hypotension with peripheral edema  -Fluid removal is limited by low blood pressure  - midodrine ordered with HD treatments.    3.  Anemia chronic kidney disease: hemoglobin 7.8. Patient prescribed mircera outpatient.   -Continue low-dose EPO with HD    4.  Secondary hyperparathyroidism.    - not currently on phosphate binders or vitamin D agents.    - Calcium acceptable  - Will monitor bone minerals     LOS: 2 Damyan Corne 9/2/202411:04 AM

## 2023-01-24 NOTE — Progress Notes (Signed)
  Received patient in bed to unit.   Informed consent signed and in chart.    TX duration:  3.5hrs     Transported back to floor  Hand-off given to patient's nurse. No c/o and no distress noted    Access used: R femoral HD Catheter   Access issues: none   Total UF removed: 2.0L Medication(s) given: none Post HD VS: 124/60 Post HD weight: 93.3kg     Lynann Beaver  [facility]

## 2023-01-24 NOTE — Progress Notes (Signed)
[facility]  Daily Progress Note   Subjective  -   He underwent successful HD today with the right fem temp cath.  He is NPO after MN for intervention in the Vascular Suite to address the occluded left BA Graft.  Objective Vitals:   01/24/23 1130 01/24/23 1135 01/24/23 1145 01/24/23 1208  BP: (!) 94/59 124/60  98/60  Pulse: (!) 106 (!) 110  92  Resp: 19 (!) 23  20  Temp:  (!) 97.5 F (36.4 C)  98.2 F (36.8 C)  TempSrc:  Oral    SpO2: 98% 100%  97%  Weight:   93.3 kg   Height:        Intake/Output Summary (Last 24 hours) at 01/24/2023 1536 Last data filed at 01/24/2023 1426 Gross per 24 hour  Intake 600 ml  Output 2000 ml  Net -1400 ml    PULM  CTAB CV  RRR VASC  Thrombosed LUE AV Graft.  No redness, bruit.  Laboratory CBC    Component Value Date/Time   WBC 9.0 01/23/2023 0644   HGB 7.8 (L) 01/23/2023 0644   HGB 12.1 (L) 09/16/2022 1003   HCT 24.0 (L) 01/23/2023 0644   HCT 36.9 (L) 09/16/2022 1003   PLT 187 01/23/2023 0644   PLT 115 (L) 09/16/2022 1003    BMET    Component Value Date/Time   NA 131 (L) 01/23/2023 0644   NA 140 09/16/2022 1003   K 3.8 01/23/2023 0644   CL 94 (L) 01/23/2023 0644   CO2 27 01/23/2023 0644   GLUCOSE 142 (H) 01/23/2023 0644   BUN 39 (H) 01/23/2023 0644   BUN 50 (H) 09/16/2022 1003   CREATININE 3.30 (H) 01/23/2023 0644   CALCIUM 8.1 (L) 01/23/2023 0644   GFRNONAA 17 (L) 01/23/2023 0644   GFRAA 25 (L) 05/26/2019 0503    Assessment/Planning: POD #3 s/p R fem temp cath for LUE AVG occlusion  Vascular IR suite tomorrow for thrombosed AV graft. Remove temp cath if successful HD done today so no need tomorrow.  Iline Oven  01/24/2023, 3:36 PM

## 2023-01-24 NOTE — Progress Notes (Signed)
R femoral HD catheter  No complications Cath ports dwelled with heparin 1000u/ml Venous port 1.85ml Arterial port 1.70ml Dressing clean dry and intact  next dressing change due 01/30/23

## 2023-01-25 ENCOUNTER — Encounter
Admission: EM | Disposition: A | Discharge status: Skilled Nursing Facility | Payer: Medicare Other | Source: Home / Self Care | Attending: Internal Medicine

## 2023-01-25 DIAGNOSIS — T82868A Thrombosis of vascular prosthetic devices, implants and grafts, initial encounter: Secondary | ICD-10-CM | POA: Diagnosis not present

## 2023-01-25 DIAGNOSIS — N186 End stage renal disease: Secondary | ICD-10-CM | POA: Diagnosis not present

## 2023-01-25 DIAGNOSIS — T82858A Stenosis of vascular prosthetic devices, implants and grafts, initial encounter: Secondary | ICD-10-CM | POA: Diagnosis not present

## 2023-01-25 DIAGNOSIS — T82898A Other specified complication of vascular prosthetic devices, implants and grafts, initial encounter: Secondary | ICD-10-CM | POA: Diagnosis not present

## 2023-01-25 DIAGNOSIS — Z992 Dependence on renal dialysis: Secondary | ICD-10-CM | POA: Diagnosis not present

## 2023-01-25 HISTORY — PX: A/V SHUNT INTERVENTION: CATH118220

## 2023-01-25 LAB — GLUCOSE, CAPILLARY
Glucose-Capillary: 81 mg/dL (ref 70–99)
Glucose-Capillary: 77 mg/dL (ref 70–99)
Glucose-Capillary: 91 mg/dL (ref 70–99)
Glucose-Capillary: 122 mg/dL — ABNORMAL HIGH (ref 70–99)
Glucose-Capillary: 169 mg/dL — ABNORMAL HIGH (ref 70–99)
Glucose-Capillary: 143 mg/dL — ABNORMAL HIGH (ref 70–99)

## 2023-01-25 LAB — CBC
HCT: 22.4 % — ABNORMAL LOW (ref 39.0–52.0)
Hemoglobin: 7.1 g/dL — ABNORMAL LOW (ref 13.0–17.0)
MCH: 31.1 pg (ref 26.0–34.0)
MCHC: 31.7 g/dL (ref 30.0–36.0)
MCV: 98.2 fL (ref 80.0–100.0)
Platelets: 184 10*3/uL (ref 150–400)
RBC: 2.28 MIL/uL — ABNORMAL LOW (ref 4.22–5.81)
RDW: 15.9 % — ABNORMAL HIGH (ref 11.5–15.5)
WBC: 7.6 10*3/uL (ref 4.0–10.5)
nRBC: 0 % (ref 0.0–0.2)

## 2023-01-25 LAB — RENAL FUNCTION PANEL
Albumin: 2.2 g/dL — ABNORMAL LOW (ref 3.5–5.0)
Anion gap: 12 (ref 5–15)
BUN: 46 mg/dL — ABNORMAL HIGH (ref 8–23)
CO2: 26 mmol/L (ref 22–32)
Calcium: 7.9 mg/dL — ABNORMAL LOW (ref 8.9–10.3)
Chloride: 95 mmol/L — ABNORMAL LOW (ref 98–111)
Creatinine, Ser: 3.97 mg/dL — ABNORMAL HIGH (ref 0.61–1.24)
GFR, Estimated: 14 mL/min — ABNORMAL LOW (ref >60)
Glucose, Bld: 141 mg/dL — ABNORMAL HIGH (ref 70–99)
Phosphorus: 4 mg/dL (ref 2.5–4.6)
Potassium: 4.3 mmol/L (ref 3.5–5.1)
Sodium: 133 mmol/L — ABNORMAL LOW (ref 135–145)

## 2023-01-25 SURGERY — A/V SHUNT INTERVENTION
Anesthesia: Moderate Sedation

## 2023-01-25 MED ORDER — ONDANSETRON HCL 4 MG/2ML IJ SOLN: 4.0000 mg | Freq: Four times a day (QID) | INTRAMUSCULAR | Status: DC | PRN

## 2023-01-25 MED ORDER — FENTANYL CITRATE PF 50 MCG/ML IJ SOSY: 12.5000 ug | PREFILLED_SYRINGE | Freq: Once | INTRAMUSCULAR | Status: DC | PRN

## 2023-01-25 MED ORDER — PENTAFLUOROPROP-TETRAFLUOROETH EX AERO
INHALATION_SPRAY | CUTANEOUS | Status: AC
  Filled 2023-01-25: qty 30

## 2023-01-25 MED ORDER — MIDAZOLAM HCL 2 MG/2ML IJ SOLN
INTRAMUSCULAR | Status: AC
  Filled 2023-01-25: qty 2

## 2023-01-25 MED ORDER — CEFAZOLIN SODIUM-DEXTROSE 1-4 GM/50ML-% IV SOLN: 1.0000 g | INTRAVENOUS | Status: DC

## 2023-01-25 MED ORDER — MIDAZOLAM HCL 2 MG/ML PO SYRP: 8.0000 mg | ORAL_SOLUTION | Freq: Once | ORAL | Status: DC | PRN

## 2023-01-25 MED ORDER — MIDODRINE HCL 5 MG PO TABS
10.0000 mg | ORAL_TABLET | Freq: Once | ORAL | Status: AC
  Administered 2023-01-25: 10 mg via ORAL

## 2023-01-25 MED ORDER — FENTANYL CITRATE (PF) 100 MCG/2ML IJ SOLN
INTRAMUSCULAR | Status: DC | PRN
  Administered 2023-01-25 (×2): 12.5 ug via INTRAVENOUS

## 2023-01-25 MED ORDER — DIPHENHYDRAMINE HCL 50 MG/ML IJ SOLN: 50.0000 mg | Freq: Once | INTRAMUSCULAR | Status: DC | PRN

## 2023-01-25 MED ORDER — HEPARIN SODIUM (PORCINE) 1000 UNIT/ML IJ SOLN
INTRAMUSCULAR | Status: AC
  Filled 2023-01-25: qty 10

## 2023-01-25 MED ORDER — FENTANYL CITRATE PF 50 MCG/ML IJ SOSY
PREFILLED_SYRINGE | INTRAMUSCULAR | Status: AC
  Filled 2023-01-25: qty 1

## 2023-01-25 MED ORDER — FAMOTIDINE 20 MG PO TABS: 40.0000 mg | ORAL_TABLET | Freq: Once | ORAL | Status: DC | PRN

## 2023-01-25 MED ORDER — DEXTROSE-SODIUM CHLORIDE 5-0.9 % IV SOLN: INTRAVENOUS | Status: AC

## 2023-01-25 MED ORDER — IODIXANOL 320 MG/ML IV SOLN
INTRAVENOUS | Status: DC | PRN
  Administered 2023-01-25: 55 mL

## 2023-01-25 MED ORDER — CEFAZOLIN SODIUM-DEXTROSE 1-4 GM/50ML-% IV SOLN
INTRAVENOUS | Status: AC
  Filled 2023-01-25: qty 50

## 2023-01-25 MED ORDER — HEPARIN (PORCINE) IN NACL 2000-0.9 UNIT/L-% IV SOLN
INTRAVENOUS | Status: DC | PRN
  Administered 2023-01-25: 1000 mL

## 2023-01-25 MED ORDER — LIDOCAINE HCL (PF) 1 % IJ SOLN
INTRAMUSCULAR | Status: DC | PRN
  Administered 2023-01-25: 10 mL via INTRADERMAL
  Administered 2023-01-25: 5 mL via INTRADERMAL

## 2023-01-25 MED ORDER — MIDODRINE HCL 5 MG PO TABS
ORAL_TABLET | ORAL | Status: AC
  Filled 2023-01-25: qty 2

## 2023-01-25 MED ORDER — HYDROMORPHONE HCL 1 MG/ML IJ SOLN: 1.0000 mg | Freq: Once | INTRAMUSCULAR | Status: DC | PRN

## 2023-01-25 MED ORDER — METHYLPREDNISOLONE SODIUM SUCC 125 MG IJ SOLR: 125.0000 mg | Freq: Once | INTRAMUSCULAR | Status: DC | PRN

## 2023-01-25 MED ORDER — HEPARIN SODIUM (PORCINE) 1000 UNIT/ML IJ SOLN
INTRAMUSCULAR | Status: DC | PRN
  Administered 2023-01-25: 5000 [IU] via INTRAVENOUS

## 2023-01-25 MED ORDER — CEFAZOLIN SODIUM-DEXTROSE 1-4 GM/50ML-% IV SOLN
INTRAVENOUS | Status: AC | PRN
  Administered 2023-01-25: 1 g via INTRAVENOUS

## 2023-01-25 MED ORDER — SODIUM CHLORIDE 0.9 % IV SOLN: INTRAVENOUS | Status: DC

## 2023-01-25 MED ORDER — MIDAZOLAM HCL 2 MG/2ML IJ SOLN
INTRAMUSCULAR | Status: DC | PRN
  Administered 2023-01-25 (×2): .5 mg via INTRAVENOUS

## 2023-01-25 SURGICAL SUPPLY — 23 items
BALLN DORADO 8X60X80 (BALLOONS) ×1
BALLN LUTONIX AV 10X40X75 (BALLOONS) ×1
BALLOON DORADO 8X60X80 (BALLOONS) IMPLANT
BALLOON LUTONIX AV 10X40X75 (BALLOONS) IMPLANT
CANISTER PENUMBRA ENGINE (MISCELLANEOUS) IMPLANT
CATH BEACON 5 .035 40 KMP TP (CATHETERS) IMPLANT
CATH EMBOLECTOMY 5FR (BALLOONS) IMPLANT
CATH INDIGO 7D KIT (CATHETERS) IMPLANT
CATH INDIGO SEP 7D (CATHETERS) IMPLANT
CATH THROMBEC CLEANERXT 6X65 (CATHETERS) IMPLANT
COVER PROBE ULTRASOUND 5X96 (MISCELLANEOUS) IMPLANT
KIT DIALYSIS CATH TRI 30X13 (CATHETERS) IMPLANT
KIT ENCORE 26 ADVANTAGE (KITS) IMPLANT
NDL ENTRY 21GA 7CM ECHOTIP (NEEDLE) IMPLANT
NEEDLE ENTRY 21GA 7CM ECHOTIP (NEEDLE) ×1 IMPLANT
PACK ANGIOGRAPHY (CUSTOM PROCEDURE TRAY) IMPLANT
SET INTRO CAPELLA COAXIAL (SET/KITS/TRAYS/PACK) IMPLANT
SHEATH BRITE TIP 5FRX11 (SHEATH) IMPLANT
SHEATH BRITE TIP 6FRX5.5 (SHEATH) IMPLANT
SHEATH BRITE TIP 7FRX5.5 (SHEATH) IMPLANT
STENT COVERA FLARED 8X60X80 (Permanent Stent) IMPLANT
SUT MNCRL AB 4-0 PS2 18 (SUTURE) IMPLANT
WIRE SUPRACORE 190CM (WIRE) IMPLANT

## 2023-01-25 NOTE — Op Note (Signed)
OPERATIVE NOTE   PROCEDURE: Contrast injection left arm brachial axillary AV graft. Mechanical thrombectomy left arm brachial axillary AV graft Mechanical thrombectomy of the left subclavian vein Percutaneous transluminal angioplasty and stent placement of the venous outflow of the AV graft peripheral segment. Percutaneous transluminal angioplasty of the subclavian vein  PRE-OPERATIVE DIAGNOSIS: Complication of dialysis access                                                       End Stage Renal Disease  POST-OPERATIVE DIAGNOSIS: same as above   SURGEON: Renford Dills, M.D.  ANESTHESIA: Conscious sedation was administered by the radiology RN under my direct supervision. IV Versed plus fentanyl were utilized. Continuous ECG, pulse oximetry and blood pressure was monitored throughout the entire procedure. Conscious sedation was for a total of 80 minutes.    ESTIMATED BLOOD LOSS: minimal  FINDING(S): Thrombus within the AV graft.  Thrombus also found in the left subclavian vein.  Stricture or stenosis within the left subclavian vein 65 to 70%, greater than 80% stenosis at the venous outflow of the AV graft  SPECIMEN(S):  None  CONTRAST: 55 cc  FLUOROSCOPY TIME: 13.6 minutes  INDICATIONS: Matthew [name] is a 89 y.o. male who  presents with thrombosed AV access.  The patient is scheduled for angiography with possible intervention of the AV access.  The patient is aware the risks include but are not limited to: bleeding, infection, thrombosis of the cannulated access, and possible anaphylactic reaction to the contrast.  The patient acknowledges if the access can not be salvaged a tunneled catheter will be needed and will be placed during this procedure.  The patient is aware of the risks of the procedure and elects to proceed with the angiogram and intervention.  DESCRIPTION: After full informed written consent was obtained, the patient was brought back to the Special Procedure  suite and placed supine position.  Appropriate cardiopulmonary monitors were placed.  The left arm was prepped and draped in the standard fashion.  Appropriate timeout is called. The left brachial axillary AV graft was cannulated with a micropuncture needle using ultrasound guidance.  With the ultrasound the AV access appeared to be filled with heterogeneous material and was poorly compressible indicating thrombosis of the AV access. The puncture was made under direct ultrasound visualization and an image was recorded for the permanent record.  The microwire was advanced and the needle was exchanged for  a microsheath.  The J-wire was then advanced and a 6 Fr sheath inserted.  Hand was then performed which demonstrated thrombus within the AV access.  The central venous structures were also imaged by hand injections.  6000 units of heparin was given and allowed to circulate as well.  Initially a cleaner device was used to perform thrombectomy with partial success and then the penumbra CAT 7 the was also opened onto the field and used to complete the thrombectomy in the venous portion.  The cleaner device was also used to perform mechanical thrombectomy of the subclavian vein.  Was also device was then advanced beginning centrally and pulling back performing.  Several passes were made through the venous portion of the graft. Follow-up imaging now demonstrates the vast majority of the clot had been treated. Therefore a retrograde sheath was inserted. This was a 6 [place] sheath and  was positioned more proximally on the arm and angled in the retrograde direction. The left brachial axillary AV graft was cannulated with a micropuncture needle using ultrasound guidance.  With the ultrasound the AV access appeared to be filled with heterogeneous material and was poorly compressible indicating thrombosis of the AV access. The puncture was made under direct ultrasound visualization and an image was recorded for the  permanent record.  Subsequently a floppy Glidewire and a KMP catheter were negotiated into the arterial system hand injection contrast was then utilized to demonstrate patency of the artery as well as the location for the anastomosis. The penumbra CAT 7 as well as the cleaner device was now advanced through the retrograde sheath was extended out into the artery the basket was opened and it was slowly pulled back into the graft and then the basket was engaged. Several passes were made on the arterial portion and pulsatility of the access was reestablished.  Follow-up imaging demonstrated persistent thrombus and a Fogarty embolectomy catheter was advanced through the arterial portion and then used to pull all of the clot in the arterial portion into the venous segment where again the penumbra CAT 7 was used to remove this.  Follow-up imaging demonstrates there was now thrombus in the venous portion surrounding the sheath and this was treated with the CAT 7 device from the antegrade direction. After several passes imaging demonstrated resolution of thrombus within the graft and forward flow however stricture of the graft was noted in the venous outflow.  A second stricture was noted in the subclavian vein  Supra core wire was then advanced through the antegrade sheath and an 8 x 6 [place] balloon was used to angioplasty the venous portion of the AV access. Multiple inflations were performed inflation times with 30 seconds to 1 minute with maximum pressures of 20 ATM.  Narrowing was noted at the proximal edge of the previously placed outflow stent and I elected to place a 8 mm x 60 mm flared Covera.  I then advanced a 10 mm x 40 mm Lutonix drug-eluting balloon through the sheath into the subclavian where angioplasty of the subclavian vein was performed to 8 atm for 1 full minute.  I then pulled the Lutonix back and used the balloon to fully expand the Covera stent.  Contrast was then injected through the 7 French  sheath demonstrating the central veins are all now widely patent with no residual stenosis and no residual thrombus.  The graft itself in its venous outflow is now widely patent.  Kumpe catheter was then introduced through the retrograde 6 French sheath and advanced to the brachial artery.  Hand-injection contrast demonstrated patency of the brachial artery arterial anastomosis as well as the arterial portion of the graft.    A 4-0 Monocryl purse-string suture was sewn around both of the sheaths.  The sheaths were removed and light pressure was applied.  A sterile bandage was applied to the puncture site.  The left arm AV graft is now patent with good flow and is ready to be utilized for dialysis    COMPLICATIONS: None  CONDITION: Improved  Renford Dills, M.D [facility] Office: [phone]  01/25/2023 11:45 AM

## 2023-01-25 NOTE — Plan of Care (Signed)
  Problem: Education: Goal: Ability to describe self-care measures that may prevent or decrease complications (Diabetes Survival Skills Education) will improve Outcome: Progressing   

## 2023-01-25 NOTE — Progress Notes (Signed)
Occupational Therapy Treatment Patient Details Name: Matthew [name] MRN: [id number] DOB: 31-Oct-1930 Today's Date: 01/25/2023   History of present illness Pt is 89 y.o. male who  presented to hospital from SNF due to L AVF occulsion, temp cath placed 01/21/23. Per chart pt is s/p R THA on 01/12/23. PMH: Anginal pain, arthritis, AAA, A fib, bradycardia, CAD, ESRD, GERD, HFrEF, HLD, HTN, IDA, MI, S/p CABG, squamous cell carcinoma, DM II, valvular insufficiency.   OT comments  Pt seen for OT treatment on this date. Upon arrival to room pt visiting with daughter, upright in bed, agreeable to tx. Pt requires setup for brushing teeth using only RUE - further mobility deferred (pt leaving for dialysis). Discussed bed positioning and R hip precautions. Pt making good progress toward goals, will continue to follow POC. Discharge recommendation remains appropriate.        If plan is discharge home, recommend the following:  A lot of help with bathing/dressing/bathroom;Assist for transportation;Help with stairs or ramp for entrance;A lot of help with walking and/or transfers   Equipment Recommendations  Other (comment)    Recommendations for Other Services      Precautions / Restrictions Restrictions Weight Bearing Restrictions: Yes Other Position/Activity Restrictions:  (post hip RLE)       Mobility Bed Mobility               General bed mobility comments: deferred (pt heading to dialysis)    Transfers                   General transfer comment: deferred (pt heading to dialysis)     Balance                                           ADL either performed or assessed with clinical judgement   ADL Overall ADL's : Needs assistance/impaired     Grooming: Oral care;Set up;Bed level Grooming Details (indicate cue type and reason): Pt requests to brush teeth                               General ADL Comments: RN enters room, stating pt will be  taken to dialysis shortly. OT assists pt with setup so that he can perform brushing teeth sitting up in bed using RUE. Pt left in bed, needs within reach and alarm activated.    Extremity/Trunk Assessment Upper Extremity Assessment Upper Extremity Assessment:  (did not attempt UE assessment or movement (s/p L graft occlusion))   Lower Extremity Assessment Lower Extremity Assessment: Defer to PT evaluation         Cognition Arousal: Alert Behavior During Therapy: WFL for tasks assessed/performed Overall Cognitive Status: Within Functional Limits for tasks assessed                                                     Pertinent Vitals/ Pain       Pain Assessment Pain Assessment: 0-10 (R hip) Pain Score: 3  Pain Intervention(s): Limited activity within patient's tolerance   Frequency  Min 1X/week        Progress Toward Goals  OT Goals(current goals can now be found in the  care plan section)  Progress towards OT goals: Progressing toward goals  Acute Rehab OT Goals Patient Stated Goal: to go home   AM-PAC OT "6 Clicks" Daily Activity     Outcome Measure   Help from another person eating meals?: None Help from another person taking care of personal grooming?: A Little Help from another person toileting, which includes using toliet, bedpan, or urinal?: A Lot Help from another person bathing (including washing, rinsing, drying)?: A Lot Help from another person to put on and taking off regular upper body clothing?: A Little Help from another person to put on and taking off regular lower body clothing?: A Lot 6 Click Score: 16    End of Session    OT Visit Diagnosis: Muscle weakness (generalized) (M62.81);History of falling (Z91.81);Pain Pain - Right/Left: Right Pain - part of body: Hip;Leg   Activity Tolerance Patient tolerated treatment well   Patient Left in bed;with call bell/phone within reach;with bed alarm set;with family/visitor present    Nurse Communication Mobility status        Time: 9562-1308 OT Time Calculation (min): 10 min  Charges: OT General Charges $OT Visit: 1 Visit OT Treatments $Self Care/Home Management : 8-22 mins  Shayden Bobier L. Rossie Scarfone, OTR/L  01/25/23, 3:17 PM

## 2023-01-25 NOTE — Progress Notes (Signed)
Pt has not voided , states that he feels the need but is unable, c/o discomfort , bladder scan shows 568 ml. Dr. Para March made aware, I/O cath order received and done. 950 ml drained from bladder, pt tolerated well.

## 2023-01-25 NOTE — Progress Notes (Signed)
[facility]  ROUNDING NOTE   Subjective:   [name] was admitted to [facility] on 01/21/2023 for ESRD on hemodialysis [facility]) [N18.6, Z99.2] Problem with dialysis access, initial encounter [facility]) [T82.898A] AV fistula occlusion, initial encounter (HCC) [T82.898A] Hemodialysis AV fistula thrombosis (HCC) [V25.366Y]  Patient seen sitting up in bed Eating breakfast Post procedure Denies pain or discomfort   Objective:  Vital signs in last 24 hours:  Temp:  [97.2 F (36.2 C)-98.7 F (37.1 C)] 98.5 F (36.9 C) (09/03 1225) Pulse Rate:  [0-101] 81 (09/03 1225) Resp:  [14-20] 16 (09/03 1225) BP: (89-122)/(47-72) 93/62 (09/03 1225) SpO2:  [92 %-100 %] 100 % (09/03 1225) Weight:  [93.3 kg] 93.3 kg (09/03 0924)  Weight change:  Filed Weights   01/24/23 0757 01/24/23 1145 01/25/23 0924  Weight: 95.2 kg 93.3 kg 93.3 kg    Intake/Output: I/O last 3 completed shifts: In: 360 [P.O.:360] Out: 2950 [Urine:950; Other:2000]   Intake/Output this shift:  Total I/O In: -  Out: 200 [Blood:200]  Physical Exam: General: NAD  Head: Normocephalic, atraumatic. Moist oral mucosal membranes  Eyes: Anicteric  Lungs:  Clear to auscultation  Heart: Regular rate and rhythm  Abdomen:  Soft, nontender,   Extremities:  ++ peripheral edema.  Neurologic: Alert, moving all four extremities  Skin: No lesions  Access: Left AVF -+ bruit or thrill, rt femoral temp cath placed on 01/21/23    Basic Metabolic Panel: Recent Labs  Lab 01/21/23 1400 01/22/23 0404 01/23/23 0644  NA 135 134* 131*  K 3.8 4.0 3.8  CL 98 93* 94*  CO2 29 28 27   GLUCOSE 172* 134* 142*  BUN 58* 65* 39*  CREATININE 3.90* 4.72* 3.30*  CALCIUM 8.3* 8.3* 8.1*  PHOS  --   --  3.9    Liver Function Tests: Recent Labs  Lab 01/21/23 1400 01/23/23 0644  AST 21  --   ALT <5  --   ALKPHOS 145*  --   BILITOT 0.9  --   PROT 6.2*  --   ALBUMIN 2.7* 2.4*   No results for input(s): "LIPASE", "AMYLASE" in  the last 168 hours. No results for input(s): "AMMONIA" in the last 168 hours.  CBC: Recent Labs  Lab 01/21/23 1400 01/22/23 0404 01/23/23 0644  WBC 10.3 8.6 9.0  NEUTROABS 8.0*  --   --   HGB 8.4* 8.0* 7.8*  HCT 26.8* 24.9* 24.0*  MCV 99.3 98.8 96.4  PLT 173 181 187    Cardiac Enzymes: No results for input(s): "CKTOTAL", "CKMB", "CKMBINDEX", "TROPONINI" in the last 168 hours.  BNP: Invalid input(s): "POCBNP"  CBG: Recent Labs  Lab 01/24/23 1730 01/24/23 2034 01/25/23 0749 01/25/23 0926 01/25/23 1136  GLUCAP 148* 115* 81 77 91    Microbiology: Results for orders placed or performed during the hospital encounter of 01/21/23  MRSA Next Gen by PCR, Nasal     Status: None   Collection Time: 01/22/23  6:33 AM   Specimen: Nasal Mucosa; Nasal Swab  Result Value Ref Range Status   MRSA by PCR Next Gen NOT DETECTED NOT DETECTED Final    Comment: (NOTE) The GeneXpert MRSA Assay (FDA approved for NASAL specimens only), is one component of a comprehensive MRSA colonization surveillance program. It is not intended to diagnose MRSA infection nor to guide or monitor treatment for MRSA infections. Test performance is not FDA approved in patients less than 34 years old. Performed at [facility], [address], [place], [place]  [address]     Coagulation Studies: No results for input(s): "LABPROT", "INR" in the last 72 hours.  Urinalysis: No results for input(s): "COLORURINE", "LABSPEC", "PHURINE", "GLUCOSEU", "HGBUR", "BILIRUBINUR", "KETONESUR", "PROTEINUR", "UROBILINOGEN", "NITRITE", "LEUKOCYTESUR" in the last 72 hours.  Invalid input(s): "APPERANCEUR"    Imaging: PERIPHERAL VASCULAR CATHETERIZATION  Result Date: 01/25/2023 See surgical note for result.    Medications:    dextrose 5 % and 0.9 % NaCl 40 mL/hr at 01/25/23 5784    Chlorhexidine Gluconate Cloth  6 each Topical Daily   cholecalciferol  1,000 Units Oral Q breakfast   clopidogrel  75 mg Oral  Daily   epoetin (EPOGEN/PROCRIT) injection  4,000 Units Intravenous Q M,W,F-HD   ferrous sulfate  325 mg Oral BID WC   heparin  5,000 Units Subcutaneous Q8H   insulin aspart  0-5 Units Subcutaneous QHS   insulin aspart  0-9 Units Subcutaneous TID WC   levothyroxine  100 mcg Oral QAC breakfast   loratadine  10 mg Oral Daily   midodrine  5 mg Oral TID WC   multivitamin  1 tablet Oral Daily   omega-3 acid ethyl esters  1 g Oral Daily   polyethylene glycol  17 g Oral Daily   rosuvastatin  40 mg Oral Daily   senna-docusate  2 tablet Oral BID   acetaminophen, fluticasone, HYDROcodone-acetaminophen, lidocaine-prilocaine, magnesium hydroxide, nitroGLYCERIN, ondansetron **OR** ondansetron (ZOFRAN) IV, traZODone  Assessment/ Plan:  [name] is a 89 y.o.  male with end stage renal disease on hemodialysis MWF, right hip fracture, chronic orthostatic hypotension, atrial fibrillation not on anticoagulation, diabetes mellitus type II who presents to [facility] for ESRD on hemodialysis (HCC) [N18.6, Z99.2] Problem with dialysis access, initial encounter (HCC) [T82.898A] AV fistula occlusion, initial encounter (HCC) [T82.898A] Hemodialysis AV fistula thrombosis (HCC) [T82.868A]  [facility] MWF [address] 92.5kg   1.  ESRD on HD MWF.  With complication of dialysis access..   - Appreciate vascular performing thrombectomy  -Will order UF only treatment today for additional fluid removal.   - Will attempt to use AVF for treatment. If functions well, will have nursing remove HD temp cath.   -Next treatment scheduled for Wednesday to maintain outpatient schedule.    2.  Chronic hypotension with peripheral edema  -Fluid removal is limited by low blood pressure  - midodrine ordered with HD treatments.    3.  Anemia chronic kidney disease: Patient prescribed mircera outpatient.   -Continue low-dose EPO with HD   - Will obtain updated labs with dialysis    4.  Secondary hyperparathyroidism.    -  not currently on phosphate binders or vitamin D agents.   - Will monitor bone minerals    LOS: 3   9/3/20242:32 PM

## 2023-01-25 NOTE — Progress Notes (Addendum)
  Progress Note   Patient: Matthew [name] UEA:[id number] DOB: 08/16/1930 DOA: 01/21/2023     3 DOS: the patient was seen and examined on 01/25/2023   Brief hospital course:  89 year old with a history of chronic systolic CHF, ESRD on HD, chronic atrial fibrillation not on anticoagulation due to high fall risk, AAA, HLD, HTN, DM2, and admission to [facility] 8/19-8/26 due to migration of a hip nail after a hip surgery with subsequent discharge to SNF for rehab who presented to the ER 8/30 with the acute onset of left arm AV fistula occlusion which was discovered when he presented to his dialysis center for HD. He denied other symptoms.     Assessment and Plan:  L brachio-axillary AV graft occlusion Care per Vascular Surgery - s/p placement of a temporal right common femoral vein dialysis catheter 8/30  Patient has an occlusion of his left brachial axillary AV graft which has worked [employer]reader for 2.5 years and this is the first time it has failed. For thrombectomy of AV graft by vascular surgery      Diabetes mellitus ESRD on HD MWF Care as per Nephrology - underwent successful HD 8/31 via new temporary catheter Continue dialysis on his scheduled days M/W/F Patient had renal replacement therapy today using his AV graft.  Plan will be to remove temporary dialysis catheter Maintain consistent carbohydrate diet     Chronic hypotension Continue usual midodrine dosing with HD treatments -blood pressure stable     Chronic paroxysmal atrial fibrillation Not on chronic anticoagulation due to high fall risk -off sotalol due to bradycardia per Cardiology -heart rate controlled presently     Anemia of chronic kidney disease H&H is stable       Chronic systolic CHF No gross volume overload at present - volume management per dialysis Last known LVEF of 35 to 40% with moderately decreased LV function and LV global hypokinesis. LV systolic function is mildly reduced and RV size is severely  enlarged with moderately elevated pulmonary artery systolic pressure.       Hypothyroidism Continue Synthroid     Coronary artery disease Continue Plavix and statins    Physical deconditioning PT evaluation      Subjective: Patient is seen and examined at the bedside.  Physical Exam: Vitals:   01/25/23 1116 01/25/23 1121 01/25/23 1126 01/25/23 1135  BP: (!) 117/52 104/64 117/62 107/69  Pulse: 90 82 (!) 0 86  Resp: 18 17  18   Temp:      TempSrc:      SpO2: 100% 99%  92%  Weight:      Height:       General: No acute respiratory distress Lungs: CTA B without wheeze Cardiovascular: RRR without murmur Abdomen: NT/ND, soft, BS positive-right groin hemodialysis catheter in place with no significant hematoma and no induration of hip Extremities: No significant cyanosis, clubbing, or edema BLE    Data Reviewed:  There are no new results to review at this time.  Family Communication: None  Disposition: Status is: Inpatient Remains inpatient appropriate because: Requires thrombectomy of AV graft  Planned Discharge Destination: Home    Time spent: 33 minutes  Author: Lucile Shutters, MD 01/25/2023 12:00 PM  For on call review [email or web address].

## 2023-01-25 NOTE — TOC Progression Note (Addendum)
Transition of Care [facility]) - Progression Note    Patient Details  Name: Matthew [name] MRN: [id number] Date of Birth: [birth year]  Transition [facility] CM/SW Contact  Margarito Liner, LCSW Phone Number: 01/25/2023, 10:43 AM  Clinical Narrative:   [facility] confirmed they can accept patient back at discharge.  2:23 pm: CSW met with patient and daughter and confirmed plan to return to [facility] at discharge.  Expected Discharge Plan and Services                                               Social Determinants of Health (SDOH) Interventions SDOH Screenings   Food Insecurity: No Food Insecurity (01/21/2023)  Housing: Low Risk  (01/21/2023)  Transportation Needs: No Transportation Needs (01/21/2023)  Utilities: Not At Risk (01/21/2023)  Depression (PHQ2-9): Low Risk  (09/21/2022)  Financial Resource Strain: Low Risk  (01/10/2018)  Physical Activity: Inactive (01/10/2018)  Social Connections: Socially Integrated (01/10/2018)  Stress: No Stress Concern Present (01/10/2018)  Tobacco Use: Medium Risk (01/22/2023)    Readmission Risk Interventions    01/09/2023    4:42 PM 02/12/2021    3:28 PM  Readmission Risk Prevention Plan  Transportation Screening Complete Complete  PCP or Specialist Appt within 3-5 Days Complete   HRI or Home Care Consult Complete   Social Work Consult for Recovery Care Planning/Counseling Complete   Palliative Care Screening Not Applicable   Medication Review Oceanographer) Complete Complete  SW Recovery Care/Counseling Consult  Complete  Skilled Nursing Facility  Complete

## 2023-01-25 NOTE — [place] FL2 (Signed)
[place] MEDICAID FL2 LEVEL OF CARE FORM     IDENTIFICATION  Patient Name: Matthew [name] Birthdate: 1931-04-20 Sex: male Admission Date (Current Location): 01/21/2023  [place] and [place] Number:  Chiropodist and Address:  [facility], [address] [place], [place] [address]      Provider Number: [id number]  Attending Physician Name and Address:  Lucile Shutters, MD  Relative Name and Phone Number:       Current Level of Care: Hospital Recommended Level of Care: Skilled Nursing Facility Prior Approval Number:    Date Approved/Denied:   PASRR Number: [id number]  Discharge Plan: SNF    Current Diagnoses: Patient Active Problem List   Diagnosis Date Noted   Hemodialysis AV fistula thrombosis (HCC) 01/22/2023   AV fistula occlusion, initial encounter (HCC) 01/21/2023   Dyslipidemia 01/21/2023   Coronary artery disease 01/21/2023   Preop cardiovascular exam 01/12/2023   Closed right hip fracture (HCC) 01/10/2023   Hip fracture (HCC) 01/08/2023   Drop in hemoglobin 11/16/2022   Hyperkalemia 11/15/2022   Closed hip fracture requiring operative repair, right, sequela 11/13/2022   HFrEF (heart failure with reduced ejection fraction) (HCC) 11/12/2022   Transaminitis 11/12/2022   Age-related osteoporosis with current pathological fracture with routine healing 02/18/2021   Pressure injury of skin 02/15/2021   Pelvic fracture (HCC) 02/11/2021   End-stage renal disease on hemodialysis (HCC)    Chronic hypotension 12/26/2020   Tremor    Hypothyroidism    Chronic systolic HF (heart failure) (HCC)    Proximal humerus fracture 12/25/2020   Malposition of peritoneal dialysis catheter (HCC) 11/01/2020   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   Secondary hyperparathyroidism (HCC) 04/30/2018   Sepsis (HCC) 02/15/2018   Cardiomyopathy (HCC) 01/24/2018   Fluid overload 01/10/2018   Climacteric arthritis, lower leg 12/15/2017   Mitral  regurgitation 12/15/2017   Hypoglycemia 12/09/2017   Osteoarthritis of knee 06/23/2016   Undiagnosed cardiac murmurs 09/04/2015   Aneurysm of thoracic aorta (HCC) 08/04/2015   Congestive heart failure (HCC) 08/04/2015   Postsurgical aortocoronary bypass status 08/04/2015   Chronic kidney disease 07/18/2015   Aortic root dilatation (HCC) 07/18/2015   Dissecting aortic aneurysm, 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/2015   Edema of both legs 07/18/2015   Shortness of breath 07/18/2015   Atrial fibrillation (HCC) 01/10/2014   Abnormal electrocardiogram 01/10/2014   Encounter for counseling 01/10/2014   Other general symptoms 01/10/2014   CAD (coronary artery disease) 12/04/2013   Unstable angina; Class III Angina 12/03/2013   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) 12/03/2013   HLD (hyperlipidemia) 12/03/2013   Heart disease 12/03/2013   Disorder of kidney and ureter 05/04/2013   Gastrointestinal hemorrhage 05/02/2012   Allergic rhinitis due to pollen 01/06/2007   Reflux esophagitis 01/06/2007   Overweight 10/06/2006    Orientation RESPIRATION BLADDER Height & Weight     Self, Time, Situation, Place  Normal Continent Weight: 205 lb 11 oz (93.3 kg) Height:  6\' 1"  (185.4 cm)  BEHAVIORAL SYMPTOMS/MOOD NEUROLOGICAL BOWEL NUTRITION STATUS   (None)  (None) Continent Diet (Renal/carb modified with fluid restriction: 1200 mL.)  AMBULATORY STATUS COMMUNICATION OF NEEDS Skin   Extensive Assist Verbally Skin abrasions, Bruising, Other (Comment), PU Stage and Appropriate Care, Surgical wounds (Erythema/redness. Incision on right thigh: Hydrocolloid.)   PU Stage 2 Dressing:  (Foam.)                   Personal Care Assistance Level of Assistance  Bathing, Feeding, Dressing Bathing Assistance: Maximum assistance Feeding assistance: Limited assistance Dressing Assistance: Maximum assistance     Functional Limitations Info  Sight, Hearing, Speech Sight Info: Adequate Hearing Info:  Adequate Speech Info: Adequate    SPECIAL CARE FACTORS FREQUENCY  PT (By licensed PT)     PT Frequency: 5 x week              Contractures Contractures Info: Not present    Additional Factors Info  Code Status, Allergies Code Status Info: Limited DNR Allergies Info: Statin Drugs, Amoxicillin           Current Medications (01/25/2023):  This is the current hospital active medication list Current Facility-Administered Medications  Medication Dose Route Frequency Provider Last Rate Last Admin   acetaminophen (TYLENOL) tablet 650 mg  650 mg Oral Q6H PRN Schnier, Latina Craver, MD       Chlorhexidine Gluconate Cloth 2 % PADS 6 each  6 each Topical Daily Schnier, Latina Craver, MD   6 each at 01/25/23 1000   cholecalciferol (VITAMIN D3) 25 MCG (1000 UNIT) tablet 1,000 Units  1,000 Units Oral Q breakfast Schnier, Latina Craver, MD   1,000 Units at 01/23/23 0755   clopidogrel (PLAVIX) tablet 75 mg  75 mg Oral Daily Schnier, Latina Craver, MD   75 mg at 01/25/23 1239   dextrose 5 %-0.9 % sodium chloride infusion   Intravenous Continuous Schnier, Latina Craver, MD 40 mL/hr at 01/25/23 0859 New Bag at 01/25/23 0859   epoetin alfa (EPOGEN) injection 4,000 Units  4,000 Units Intravenous Q M,W,F-HD Schnier, Latina Craver, MD       ferrous sulfate tablet 325 mg  325 mg Oral BID WC Schnier, Latina Craver, MD   325 mg at 01/24/23 1750   fluticasone (FLONASE) 50 MCG/ACT nasal spray 2 spray  2 spray Each Nare Daily PRN Schnier, Latina Craver, MD       heparin injection 5,000 Units  5,000 Units Subcutaneous Q8H Schnier, Latina Craver, MD   5,000 Units at 01/25/23 0537   HYDROcodone-acetaminophen (NORCO/VICODIN) 5-325 MG per tablet 1 tablet  1 tablet Oral Q4H PRN Schnier, Latina Craver, MD   1 tablet at 01/24/23 1754   insulin aspart (novoLOG) injection 0-5 Units  0-5 Units Subcutaneous QHS Schnier, Latina Craver, MD       insulin aspart (novoLOG) injection 0-9 Units  0-9 Units Subcutaneous TID WC Schnier, Latina Craver, MD   1 Units at 01/24/23  1750   levothyroxine (SYNTHROID) tablet 100 mcg  100 mcg Oral QAC breakfast Schnier, Latina Craver, MD   100 mcg at 01/24/23 0510   lidocaine-prilocaine (EMLA) cream 1 Application  1 Application Topical PRN Schnier, Latina Craver, MD       loratadine (CLARITIN) tablet 10 mg  10 mg Oral Daily Schnier, Latina Craver, MD   10 mg at 01/25/23 1239   magnesium hydroxide (MILK OF MAGNESIA) suspension 30 mL  30 mL Oral Daily PRN Schnier, Latina Craver, MD       midodrine (PROAMATINE) tablet 5 mg  5 mg Oral TID WC Schnier, Latina Craver, MD   5 mg at 01/25/23 1241   multivitamin (RENA-VIT) tablet 1 tablet  1 tablet Oral Daily Schnier, Latina Craver, MD   1 tablet at 01/25/23 1240   nitroGLYCERIN (NITROSTAT) SL tablet 0.4 mg  0.4 mg Sublingual Q5 min PRN Schnier, Latina Craver, MD       omega-3 acid ethyl esters (LOVAZA) capsule 1 g  1 g Oral Daily Schnier,  Latina Craver, MD   1 g at 01/25/23 1240   ondansetron (ZOFRAN) tablet 4 mg  4 mg Oral Q6H PRN Schnier, Latina Craver, MD       Or   ondansetron [facility]) injection 4 mg  4 mg Intravenous Q6H PRN Schnier, Latina Craver, MD       polyethylene glycol (MIRALAX / GLYCOLAX) packet 17 g  17 g Oral Daily Schnier, Latina Craver, MD   17 g at 01/25/23 1240   rosuvastatin (CRESTOR) tablet 40 mg  40 mg Oral Daily Schnier, Latina Craver, MD   40 mg at 01/25/23 1240   senna-docusate (Senokot-S) tablet 2 tablet  2 tablet Oral BID Renford Dills, MD   2 tablet at 01/25/23 1241   traZODone (DESYREL) tablet 25 mg  25 mg Oral QHS PRN Schnier, Latina Craver, MD         Discharge Medications: Please see discharge summary for a list of discharge medications.  Relevant Imaging Results:  Relevant Lab Results:   Additional Information SS#: [id number]. HD [facility] MWF 11:15  Margarito Liner, LCSW

## 2023-01-25 NOTE — Progress Notes (Signed)
PT Cancellation Note  Patient Details Name: Matthew [name] MRN: [id number] DOB: 1930-10-07   Cancelled Treatment:     Pt is in procedure at this time and not appropriate for PT. Will attempt to see Pt at a later time.   Novalyn Lajara 01/25/2023, 8:48 AM

## 2023-01-25 NOTE — Care Management Important Message (Signed)
Important Message  Patient Details  Name: Matthew [name] MRN: [id number] Date of Birth: 1931/03/06   Medicare Important Message Given:  Yes  Patient out of room for procedure upon time of visit.  Copy of Medicare IM reviewed and left with daughter in room.     Johnell Comings 01/25/2023, 10:59 AM

## 2023-01-25 NOTE — Progress Notes (Addendum)
  Received patient in bed to unit.   Informed consent signed and in chart.    TX duration: 2hrs     Transported back to floor   Hand-off given to patient's nurse. No c/o  pt asymptomatic    Access used: LAVF  Access issues: none  cannulated 15gx2 with good blood flow    Total UF removed: 0.3L pt was given 200cc n/s during tx for low b/p Medication(s) given: 10mg  midodrine  Post HD VS: 108/68 Post HD weight: 91.8kg  Since b/p's in 70's systolic, per MD tx terminated       Lynann Beaver  [facility]

## 2023-01-25 NOTE — Progress Notes (Signed)
Dr. Gilda Crease at bedside, speaking with pt. And his daughter Matthew [name] re: fisulagram pre-op. Daughter Matthew [name] verbalized understanding of conversation.

## 2023-01-26 ENCOUNTER — Encounter: Payer: Self-pay | Admitting: Vascular Surgery

## 2023-01-26 DIAGNOSIS — L89152 Pressure ulcer of sacral region, stage 2: Secondary | ICD-10-CM | POA: Diagnosis not present

## 2023-01-26 DIAGNOSIS — R600 Localized edema: Secondary | ICD-10-CM | POA: Diagnosis not present

## 2023-01-26 DIAGNOSIS — L89319 Pressure ulcer of right buttock, unspecified stage: Secondary | ICD-10-CM | POA: Diagnosis present

## 2023-01-26 DIAGNOSIS — I5022 Chronic systolic (congestive) heart failure: Secondary | ICD-10-CM | POA: Diagnosis not present

## 2023-01-26 DIAGNOSIS — Z452 Encounter for adjustment and management of vascular access device: Secondary | ICD-10-CM | POA: Diagnosis not present

## 2023-01-26 DIAGNOSIS — F1090 Alcohol use, unspecified, uncomplicated: Secondary | ICD-10-CM | POA: Diagnosis not present

## 2023-01-26 DIAGNOSIS — T82868D Thrombosis of vascular prosthetic devices, implants and grafts, subsequent encounter: Secondary | ICD-10-CM | POA: Diagnosis not present

## 2023-01-26 DIAGNOSIS — I252 Old myocardial infarction: Secondary | ICD-10-CM | POA: Diagnosis not present

## 2023-01-26 DIAGNOSIS — E785 Hyperlipidemia, unspecified: Secondary | ICD-10-CM | POA: Diagnosis present

## 2023-01-26 DIAGNOSIS — Z992 Dependence on renal dialysis: Secondary | ICD-10-CM | POA: Diagnosis not present

## 2023-01-26 DIAGNOSIS — L89329 Pressure ulcer of left buttock, unspecified stage: Secondary | ICD-10-CM | POA: Diagnosis present

## 2023-01-26 DIAGNOSIS — I951 Orthostatic hypotension: Secondary | ICD-10-CM | POA: Diagnosis not present

## 2023-01-26 DIAGNOSIS — I2510 Atherosclerotic heart disease of native coronary artery without angina pectoris: Secondary | ICD-10-CM | POA: Diagnosis not present

## 2023-01-26 DIAGNOSIS — I132 Hypertensive heart and chronic kidney disease with heart failure and with stage 5 chronic kidney disease, or end stage renal disease: Secondary | ICD-10-CM | POA: Diagnosis present

## 2023-01-26 DIAGNOSIS — Z8249 Family history of ischemic heart disease and other diseases of the circulatory system: Secondary | ICD-10-CM | POA: Diagnosis not present

## 2023-01-26 DIAGNOSIS — T82898A Other specified complication of vascular prosthetic devices, implants and grafts, initial encounter: Secondary | ICD-10-CM | POA: Diagnosis not present

## 2023-01-26 DIAGNOSIS — E1122 Type 2 diabetes mellitus with diabetic chronic kidney disease: Secondary | ICD-10-CM | POA: Diagnosis not present

## 2023-01-26 DIAGNOSIS — Z741 Need for assistance with personal care: Secondary | ICD-10-CM | POA: Diagnosis not present

## 2023-01-26 DIAGNOSIS — Z85828 Personal history of other malignant neoplasm of skin: Secondary | ICD-10-CM | POA: Diagnosis not present

## 2023-01-26 DIAGNOSIS — F0390 Unspecified dementia without behavioral disturbance: Secondary | ICD-10-CM | POA: Diagnosis present

## 2023-01-26 DIAGNOSIS — M1990 Unspecified osteoarthritis, unspecified site: Secondary | ICD-10-CM | POA: Diagnosis not present

## 2023-01-26 DIAGNOSIS — J309 Allergic rhinitis, unspecified: Secondary | ICD-10-CM | POA: Diagnosis not present

## 2023-01-26 DIAGNOSIS — Z7982 Long term (current) use of aspirin: Secondary | ICD-10-CM | POA: Diagnosis not present

## 2023-01-26 DIAGNOSIS — G4700 Insomnia, unspecified: Secondary | ICD-10-CM | POA: Diagnosis not present

## 2023-01-26 DIAGNOSIS — I130 Hypertensive heart and chronic kidney disease with heart failure and stage 1 through stage 4 chronic kidney disease, or unspecified chronic kidney disease: Secondary | ICD-10-CM | POA: Diagnosis not present

## 2023-01-26 DIAGNOSIS — T82590A Other mechanical complication of surgically created arteriovenous fistula, initial encounter: Secondary | ICD-10-CM | POA: Diagnosis not present

## 2023-01-26 DIAGNOSIS — E876 Hypokalemia: Secondary | ICD-10-CM | POA: Diagnosis present

## 2023-01-26 DIAGNOSIS — D638 Anemia in other chronic diseases classified elsewhere: Secondary | ICD-10-CM | POA: Diagnosis not present

## 2023-01-26 DIAGNOSIS — T82868A Thrombosis of vascular prosthetic devices, implants and grafts, initial encounter: Secondary | ICD-10-CM | POA: Diagnosis not present

## 2023-01-26 DIAGNOSIS — D631 Anemia in chronic kidney disease: Secondary | ICD-10-CM | POA: Diagnosis present

## 2023-01-26 DIAGNOSIS — S72141G Displaced intertrochanteric fracture of right femur, subsequent encounter for closed fracture with delayed healing: Secondary | ICD-10-CM | POA: Diagnosis not present

## 2023-01-26 DIAGNOSIS — Z87891 Personal history of nicotine dependence: Secondary | ICD-10-CM | POA: Diagnosis not present

## 2023-01-26 DIAGNOSIS — Z96641 Presence of right artificial hip joint: Secondary | ICD-10-CM | POA: Diagnosis not present

## 2023-01-26 DIAGNOSIS — Z79890 Hormone replacement therapy (postmenopausal): Secondary | ICD-10-CM | POA: Diagnosis not present

## 2023-01-26 DIAGNOSIS — E559 Vitamin D deficiency, unspecified: Secondary | ICD-10-CM | POA: Diagnosis not present

## 2023-01-26 DIAGNOSIS — I480 Paroxysmal atrial fibrillation: Secondary | ICD-10-CM | POA: Diagnosis not present

## 2023-01-26 DIAGNOSIS — D509 Iron deficiency anemia, unspecified: Secondary | ICD-10-CM | POA: Diagnosis not present

## 2023-01-26 DIAGNOSIS — R69 Illness, unspecified: Secondary | ICD-10-CM | POA: Diagnosis not present

## 2023-01-26 DIAGNOSIS — Z951 Presence of aortocoronary bypass graft: Secondary | ICD-10-CM | POA: Diagnosis not present

## 2023-01-26 DIAGNOSIS — E8809 Other disorders of plasma-protein metabolism, not elsewhere classified: Secondary | ICD-10-CM | POA: Diagnosis not present

## 2023-01-26 DIAGNOSIS — S72141D Displaced intertrochanteric fracture of right femur, subsequent encounter for closed fracture with routine healing: Secondary | ICD-10-CM | POA: Diagnosis not present

## 2023-01-26 DIAGNOSIS — N2581 Secondary hyperparathyroidism of renal origin: Secondary | ICD-10-CM | POA: Diagnosis present

## 2023-01-26 DIAGNOSIS — Y712 Prosthetic and other implants, materials and accessory cardiovascular devices associated with adverse incidents: Secondary | ICD-10-CM | POA: Diagnosis present

## 2023-01-26 DIAGNOSIS — Z7901 Long term (current) use of anticoagulants: Secondary | ICD-10-CM | POA: Diagnosis not present

## 2023-01-26 DIAGNOSIS — I959 Hypotension, unspecified: Secondary | ICD-10-CM | POA: Diagnosis present

## 2023-01-26 DIAGNOSIS — Z66 Do not resuscitate: Secondary | ICD-10-CM | POA: Diagnosis present

## 2023-01-26 DIAGNOSIS — N250 Renal osteodystrophy: Secondary | ICD-10-CM | POA: Diagnosis not present

## 2023-01-26 DIAGNOSIS — N186 End stage renal disease: Secondary | ICD-10-CM | POA: Diagnosis not present

## 2023-01-26 DIAGNOSIS — E039 Hypothyroidism, unspecified: Secondary | ICD-10-CM | POA: Diagnosis not present

## 2023-01-26 DIAGNOSIS — E1121 Type 2 diabetes mellitus with diabetic nephropathy: Secondary | ICD-10-CM | POA: Diagnosis not present

## 2023-01-26 DIAGNOSIS — I351 Nonrheumatic aortic (valve) insufficiency: Secondary | ICD-10-CM | POA: Diagnosis present

## 2023-01-26 DIAGNOSIS — M810 Age-related osteoporosis without current pathological fracture: Secondary | ICD-10-CM | POA: Diagnosis not present

## 2023-01-26 DIAGNOSIS — T82598A Other mechanical complication of other cardiac and vascular devices and implants, initial encounter: Secondary | ICD-10-CM | POA: Diagnosis not present

## 2023-01-26 DIAGNOSIS — K5900 Constipation, unspecified: Secondary | ICD-10-CM | POA: Diagnosis not present

## 2023-01-26 DIAGNOSIS — K219 Gastro-esophageal reflux disease without esophagitis: Secondary | ICD-10-CM | POA: Diagnosis not present

## 2023-01-26 LAB — CBC
HCT: 24.1 % — ABNORMAL LOW (ref 39.0–52.0)
Hemoglobin: 7.6 g/dL — ABNORMAL LOW (ref 13.0–17.0)
MCH: 30.5 pg (ref 26.0–34.0)
MCHC: 31.5 g/dL (ref 30.0–36.0)
MCV: 96.8 fL (ref 80.0–100.0)
Platelets: 192 10*3/uL (ref 150–400)
RBC: 2.49 MIL/uL — ABNORMAL LOW (ref 4.22–5.81)
RDW: 16 % — ABNORMAL HIGH (ref 11.5–15.5)
WBC: 8.2 10*3/uL (ref 4.0–10.5)
nRBC: 0 % (ref 0.0–0.2)

## 2023-01-26 LAB — BASIC METABOLIC PANEL
Anion gap: 10 (ref 5–15)
BUN: 54 mg/dL — ABNORMAL HIGH (ref 8–23)
CO2: 25 mmol/L (ref 22–32)
Calcium: 7.9 mg/dL — ABNORMAL LOW (ref 8.9–10.3)
Chloride: 97 mmol/L — ABNORMAL LOW (ref 98–111)
Creatinine, Ser: 4.39 mg/dL — ABNORMAL HIGH (ref 0.61–1.24)
GFR, Estimated: 12 mL/min — ABNORMAL LOW (ref >60)
Glucose, Bld: 119 mg/dL — ABNORMAL HIGH (ref 70–99)
Potassium: 4.4 mmol/L (ref 3.5–5.1)
Sodium: 132 mmol/L — ABNORMAL LOW (ref 135–145)

## 2023-01-26 LAB — GLUCOSE, CAPILLARY
Glucose-Capillary: 111 mg/dL — ABNORMAL HIGH (ref 70–99)
Glucose-Capillary: 119 mg/dL — ABNORMAL HIGH (ref 70–99)

## 2023-01-26 LAB — HEPATITIS B SURFACE ANTIBODY, QUANTITATIVE: Hep B S AB Quant (Post): 18.9 m[IU]/mL

## 2023-01-26 MED ORDER — EPOETIN ALFA 4000 UNIT/ML IJ SOLN
INTRAMUSCULAR | Status: AC
  Filled 2023-01-26: qty 1

## 2023-01-26 MED ORDER — HEPARIN SODIUM (PORCINE) 1000 UNIT/ML DIALYSIS: 1000.0000 [IU] | INTRAMUSCULAR | Status: DC | PRN

## 2023-01-26 MED ORDER — ALTEPLASE 2 MG IJ SOLR: 2.0000 mg | Freq: Once | INTRAMUSCULAR | Status: DC | PRN

## 2023-01-26 MED ORDER — ALBUMIN HUMAN 25 % IV SOLN
25.0000 g | Freq: Once | INTRAVENOUS | Status: AC
  Administered 2023-01-26: 25 g via INTRAVENOUS
  Filled 2023-01-26: qty 100

## 2023-01-26 NOTE — Plan of Care (Signed)

## 2023-01-26 NOTE — Progress Notes (Signed)
  Progress Note    01/26/2023 11:42 AM 1 Day Post-Op  Subjective:  Mr Loin Beamesderfer is a 89 yo male who is now POD#1 from left upper extremity angiogram of AV fistula.  Patient is recovering as expected.  No hematoma seroma or infection to note in the fistula.  Patient has no complaints overnight.  Vitals all remained stable.  Patient's daughter is at the bedside.  She endorses the same.   Vitals:   01/26/23 0244 01/26/23 0816  BP: (!) 100/50 95/62  Pulse: 76 99  Resp: 16 18  Temp: 98.2 F (36.8 C) (!) 97.2 F (36.2 C)  SpO2: 99% 98%   Physical Exam: Cardiac:  RRR, Normal S1, S2. No murmurs.  Lungs: Clear on auscultation throughout.  No rales rhonchi or wheezing.  Normal respiratory effort. Incisions: Left upper extremity AV graft incision site.  Clean dry and intact.  Band-Aid in place Extremities: Palpable pulses throughout.  Left upper extremity warm to touch with good capillary refill. Abdomen: Positive bowel sounds throughout.  Soft, nontender and nondistended. Neurologic: Patient is alert awake and oriented in no acute distress.  CBC    Component Value Date/Time   WBC 8.2 01/26/2023 0442   RBC 2.49 (L) 01/26/2023 0442   HGB 7.6 (L) 01/26/2023 0442   HGB 12.1 (L) 09/16/2022 1003   HCT 24.1 (L) 01/26/2023 0442   HCT 36.9 (L) 09/16/2022 1003   PLT 192 01/26/2023 0442   PLT 115 (L) 09/16/2022 1003   MCV 96.8 01/26/2023 0442   MCV 96 09/16/2022 1003   MCH 30.5 01/26/2023 0442   MCHC 31.5 01/26/2023 0442   RDW 16.0 (H) 01/26/2023 0442   RDW 13.5 09/16/2022 1003   LYMPHSABS 0.5 (L) 01/21/2023 1400   LYMPHSABS 0.8 09/16/2022 1003   MONOABS 1.4 (H) 01/21/2023 1400   EOSABS 0.2 01/21/2023 1400   EOSABS 0.3 09/16/2022 1003   BASOSABS 0.0 01/21/2023 1400   BASOSABS 0.1 09/16/2022 1003    BMET    Component Value Date/Time   NA 132 (L) 01/26/2023 0442   NA 140 09/16/2022 1003   K 4.4 01/26/2023 0442   CL 97 (L) 01/26/2023 0442   CO2 25 01/26/2023 0442   GLUCOSE  119 (H) 01/26/2023 0442   BUN 54 (H) 01/26/2023 0442   BUN 50 (H) 09/16/2022 1003   CREATININE 4.39 (H) 01/26/2023 0442   CALCIUM 7.9 (L) 01/26/2023 0442   GFRNONAA 12 (L) 01/26/2023 0442   GFRAA 25 (L) 05/26/2019 0503    INR    Component Value Date/Time   INR 1.1 11/12/2022 0618     Intake/Output Summary (Last 24 hours) at 01/26/2023 1142 Last data filed at 01/26/2023 0600 Gross per 24 hour  Intake 16.41 ml  Output 300 ml  Net -283.59 ml     Assessment/Plan:  89 y.o. male is s/p left upper extremity AV fistulogram.  1 Day Post-Op   PLAN: Per vascular surgery okay for discharge today.  No further complications noted from left upper extremity AV fistulogram.  Okay to use for dialysis.  Patient can schedule follow-up appointment with vein and vascular as needed.  DVT prophylaxis: Heparin with dialysis.   Marcie Bal [facility] 01/26/2023 11:42 AM

## 2023-01-26 NOTE — TOC Transition Note (Signed)
Transition of Care [facility]) - CM/SW Discharge Note   Patient Details  Name: Matthew [name] MRN: [id number] Date of Birth: 23-Jul-1930  [facility] CM/SW Contact:  Chapman Fitch, RN Phone Number: 01/26/2023, 4:15 PM   Clinical Narrative:        Patient will DC to: [facility] Anticipated DC date: 01/26/23  Family notified: daughter [name] Transport byWendie Simmer  Per MD patient ready for DC to . RN, patient, patient's family, and facility notified of DC. Discharge Summary sent to facility. RN given number for report. DC packet on chart.   EMS packet and DNR on chart Bedside RN to call EMS after HD completed and temp cath removed  TOC signing off.      Patient Goals and CMS Choice      Discharge Placement                         Discharge Plan and Services Additional resources added to the After Visit Summary for                                       Social Determinants of Health (SDOH) Interventions SDOH Screenings   Food Insecurity: No Food Insecurity (01/21/2023)  Housing: Low Risk  (01/21/2023)  Transportation Needs: No Transportation Needs (01/21/2023)  Utilities: Not At Risk (01/21/2023)  Depression (PHQ2-9): Low Risk  (09/21/2022)  Financial Resource Strain: Low Risk  (01/10/2018)  Physical Activity: Inactive (01/10/2018)  Social Connections: Socially Integrated (01/10/2018)  Stress: No Stress Concern Present (01/10/2018)  Tobacco Use: Medium Risk (01/22/2023)     Readmission Risk Interventions    01/09/2023    4:42 PM 02/12/2021    3:28 PM  Readmission Risk Prevention Plan  Transportation Screening Complete Complete  PCP or Specialist Appt within 3-5 Days Complete   HRI or Home Care Consult Complete   Social Work Consult for Recovery Care Planning/Counseling Complete   Palliative Care Screening Not Applicable   Medication Review Oceanographer) Complete Complete  SW Recovery Care/Counseling Consult  Complete  Skilled  Nursing Facility  Complete

## 2023-01-26 NOTE — Progress Notes (Signed)
Physical Therapy Treatment Patient Details Name: Matthew [name] MRN: [id number] DOB: 08-May-1931 Today's Date: 01/26/2023   History of Present Illness Pt is 89 y.o. male who  presented to hospital from SNF due to L AVF occulsion, temp cath placed 01/21/23. Per chart pt is s/p R THA on 01/12/23. PMH: Anginal pain, arthritis, AAA, A fib, bradycardia, CAD, ESRD, GERD, HFrEF, HLD, HTN, IDA, MI, S/p CABG, squamous cell carcinoma, DM II, valvular insufficiency.    PT Comments  Pt is prepping to head to HD at the time of arrival. Will attempt to see Pt following dialysis later today for PT session.    If plan is discharge home, recommend the following:     Can travel by private vehicle        Equipment Recommendations       Recommendations for Other Services       Precautions / Restrictions       Mobility  Bed Mobility                    Transfers                        Ambulation/Gait                   Stairs             Wheelchair Mobility     Tilt Bed    Modified Rankin (Stroke Patients Only)       Balance                                            Cognition                                                Exercises      General Comments        Pertinent Vitals/Pain      Home Living                          Prior Function            PT Goals (current goals can now be found in the care plan section)      Frequency           PT Plan      Co-evaluation              AM-PAC PT "6 Clicks" Mobility   Outcome Measure                   End of Session               Time:  -     Charges:                            Elmon Else, SPT    Matthew [name] 01/26/2023, 9:02 AM

## 2023-01-26 NOTE — Discharge Summary (Signed)
Physician Discharge Summary  Matthew [name] ZOX:[id number] DOB: 1931-05-04 DOA: 01/21/2023  PCP: Sherron Monday, MD  Admit date: 01/21/2023 Discharge date: 01/26/2023  Admitted From: home  Disposition:  SNF  Recommendations for Outpatient Follow-up:  Follow up with PCP in 1-2 weeks F/u w/ nephro in 1-2 weeks   Home Health: no  Equipment/Devices:  Discharge Condition: stable  CODE STATUS: DNR Diet recommendation: Heart Healthy / Carb Modified   Brief/Interim Summary: HPI was taken from Dr. Arville Care: Matthew [name] is a 89 y.o. Caucasian male with medical history significant for HFrEF, ESRD on HD, AAA, dyslipidemia, hypertension, and type diabetes mellitus, who presented to the emergency room with acute onset of left arm AV fistula occlusion.  The patient missed hemodialysis on Wednesday and had a session yesterday.  He was going for another session today that could not be performed due to his left AVF occlusion.  No chest pain or palpitations.  No nausea or vomiting or abdominal pain.  No cough or wheezing or dyspnea.  No dysuria, hematuria or urinary frequency or urgency or flank pain.   ED Course: Upon presenting to the emergency room, temperature was 99.1, BP was 100/47 with heart rate of 104 with otherwise normal vital signs.  Labs revealed a BUN of 58 and creatinine 3.9 with calcium 8.3, alk phos 145, albumin 2.7, total protein 6.2 and CBC showed hemoglobin 8.4 hematocrit 26.8 close to previous levels. EKG as reviewed by me : None Imaging: None.   Vascular surgery was contacted about the patient and Dr. Gilda Crease and recommended having a temporary catheter.  I discussed the case with Dr. Wynelle Link as well.  The patient will be placed in observation and medical bed for further evaluation and management.  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 Joylene Igo:  L brachio-axillary AV graft occlusion Care per Vascular Surgery - s/p placement of a temporal right common femoral vein dialysis catheter 8/30. S/p mechanical thrombectomy of left arm brachial axillary AV graft as per vasc surg on 9/3   Diabetes mellitus: maintain carb modified diet  ESRD on HD MWF Care as per Nephrology - underwent successful HD 8/31 via new temporary catheter Continue dialysis on his scheduled days M/W/F   Chronic hypotension Continue usual midodrine dosing with HD treatments -blood pressure stable   Chronic paroxysmal atrial fibrillation Not on chronic anticoagulation due to high fall risk -off sotalol due to bradycardia per Cardiology -heart rate controlled presently  Anemia of chronic kidney disease H&H is stable  Chronic systolic CHF No gross volume overload at present - volume management per dialysis Last known LVEF of 35 to 40% with moderately decreased LV function and LV global hypokinesis. LV systolic function is mildly reduced and RV size is severely enlarged with moderately elevated pulmonary artery systolic pressure.   Hypothyroidism Continue Synthroid   Coronary artery disease Continue Plavix and statins   Physical deconditioning PT recs SNF    Discharge Instructions  Discharge Instructions     Diet - low sodium heart healthy   Complete by: As directed    Diet Carb Modified   Complete by: As directed    Discharge instructions   Complete by: As directed    F/u w/ PCP in 1-2 weeks. F/u w/ nephro in  1-2 weeks   Increase activity slowly   Complete by: As directed    No wound care   Complete by: As directed       Allergies as of 01/26/2023       Reactions   Other    Other reaction(s): Myalgias (intolerance), Other (See Comments) Statin Drugs  Statin Drugs    Amoxicillin Diarrhea        Medication List     STOP taking these medications     Belsomra 10 MG Tabs Generic drug: Suvorexant   cefadroxil 500 MG capsule Commonly known as: DURICEF   HYDROcodone-acetaminophen 5-325 MG tablet Commonly known as: NORCO/VICODIN       TAKE these medications    cetirizine 10 MG tablet Commonly known as: ZYRTEC Take 10 mg by mouth daily.   cholecalciferol 25 MCG (1000 UNIT) tablet Commonly known as: VITAMIN D3 Take 1,000 Units by mouth daily with breakfast.   clopidogrel 75 MG tablet Commonly known as: PLAVIX Take 1 tablet (75 mg total) by mouth daily.   ferrous sulfate 325 (65 FE) MG tablet Take 325 mg by mouth 2 (two) times daily with a meal.   Fish Oil 1000 MG Caps Take 4,000 mg by mouth daily with lunch.   fluticasone 50 MCG/ACT nasal spray Commonly known as: FLONASE Place 2 sprays into both nostrils daily as needed for allergies or rhinitis.   levothyroxine 100 MCG tablet Commonly known as: SYNTHROID Take 100 mcg by mouth daily before breakfast.   lidocaine-prilocaine cream Commonly known as: EMLA Apply 1 Application topically as needed (before dialysis).   midodrine 5 MG tablet Commonly known as: PROAMATINE Take 1 tablet (5 mg total) by mouth 3 (three) times daily with meals. What changed:  when to take this additional instructions   multivitamin Tabs tablet Take 1 tablet by mouth daily.   nitroGLYCERIN 0.4 MG SL tablet Commonly known as: NITROSTAT Place 0.4 mg under the tongue every 5 (five) minutes as needed for chest pain.   polyethylene glycol 17 g packet Commonly known as: MIRALAX / GLYCOLAX Take 17 g by mouth daily.   rosuvastatin 40 MG tablet Commonly known as: CRESTOR TAKE 1 TABLET BY MOUTH ONCE  DAILY   senna-docusate 8.6-50 MG tablet Commonly known as: Senokot-S Take 2 tablets by mouth 2 (two) times daily.        Contact information for after-discharge care     Destination     [facility] Preferred SNF .    Service: Skilled Nursing Contact information: [address] [place] [address] [phone]                    Allergies  Allergen Reactions   Other     Other reaction(s): Myalgias (intolerance), Other (See Comments) Statin Drugs  Statin Drugs     Amoxicillin Diarrhea    Consultations: Nephro Vasc surg    Procedures/Studies: PERIPHERAL VASCULAR CATHETERIZATION  Result Date: 01/25/2023 See surgical note for result.  DG Abdomen 1 View  Result Date: 01/21/2023 CLINICAL DATA:  Post Vas-Cath insertion EXAM: ABDOMEN - 1 VIEW COMPARISON:  08/18/2020 FINDINGS: Right groin Vas-Cath in place with the tip in the upper pelvis. Nonobstructive bowel gas pattern. No organomegaly or free air. Prior cholecystectomy. IMPRESSION: Right groin Vas-Cath in the upper pelvis. No acute findings. Electronically Signed   By: Charlett Nose M.D.   On: 01/21/2023 20:19   DG HIP UNILAT WITH  PELVIS 2-3 VIEWS RIGHT  Result Date: 01/17/2023 CLINICAL DATA:  Right hip surgery EXAM: DG HIP (WITH OR WITHOUT PELVIS) 2-3V RIGHT COMPARISON:  01/12/2023 FINDINGS: Status post right total hip arthroplasty. Arthroplasty components are in their expected alignment without periprosthetic lucency or fracture. Bones are diffusely demineralized. Postoperative soft tissue air has resolved. IMPRESSION: Status post right total hip arthroplasty without evidence of hardware complication. Electronically Signed   By: Duanne Guess D.O.   On: 01/17/2023 11:59   DG HIP UNILAT W OR W/O PELVIS 2-3 VIEWS RIGHT  Result Date: 01/12/2023 CLINICAL DATA:  Postop. EXAM: DG HIP (WITH OR WITHOUT PELVIS) 2-3V RIGHT COMPARISON:  Preoperative imaging. FINDINGS: Removal of femoral intramedullary nail and trans trochanteric screw fixation. Interval right hip arthroplasty in expected alignment. Cerclage wire about the femoral stem. Chronic displacement of the lesser trochanter. The bones are subjectively under  mineralized. Remote pubic rami fractures. Recent postsurgical change includes air and edema in the soft tissues. IMPRESSION: Interval right hip arthroplasty without immediate postoperative complication. Electronically Signed   By: Narda Rutherford M.D.   On: 01/12/2023 19:53   DG HIP UNILAT WITH PELVIS 1V RIGHT  Result Date: 01/12/2023 CLINICAL DATA:  Elective surgery. EXAM: DG HIP (WITH OR WITHOUT PELVIS) 1V RIGHT COMPARISON:  Preoperative imaging FINDINGS: Single fluoroscopic spot view of the right hip obtained in the operating room. Image obtained during hip arthroplasty. Fluoroscopy time 10 seconds. Dose 4.91 mGy. IMPRESSION: Intraoperative fluoroscopy during right hip arthroplasty. Electronically Signed   By: Narda Rutherford M.D.   On: 01/12/2023 18:26   DG C-Arm 1-60 Min-No Report  Result Date: 01/12/2023 Fluoroscopy was utilized by the requesting physician.  No radiographic interpretation.   DG C-Arm 1-60 Min-No Report  Result Date: 01/12/2023 Fluoroscopy was utilized by the requesting physician.  No radiographic interpretation.   ECHOCARDIOGRAM COMPLETE  Result Date: 01/12/2023    ECHOCARDIOGRAM REPORT   Patient Name:   RIGEL KOYANAGI Date of Exam: 01/11/2023 Medical Rec #:  [id number]       Height:       73.0 in Accession #:    [id number]      Weight:       216.5 lb Date of Birth:  1931-04-26        BSA:          2.225 m Patient Age:    89 years        BP:           102/45 mmHg Patient Gender: M               HR:           60 bpm. Exam Location:  Inpatient Procedure: 2D Echo, Cardiac Doppler and Color Doppler Indications:    Abnormal ECG R94.31  History:        Patient has prior history of Echocardiogram examinations, most                 recent 01/11/2018. Cardiomyopathy and CHF, CAD, Angina and                 Previous Myocardial Infarction, Prior CABG, Arrythmias:Atrial                 Fibrillation, Signs/Symptoms:Hypotension and Shortness of                 Breath; Risk Factors:Diabetes  and Dyslipidemia. ESRD.  Sonographer:    [name] Referring Phys: [id number] ANASTASSIA DOUTOVA IMPRESSIONS  1. Left  ventricular ejection fraction, by estimation, is 35 to 40%. The left ventricle has moderately decreased function. The left ventricle demonstrates global hypokinesis. There is mild concentric left ventricular hypertrophy. Left ventricular diastolic parameters are indeterminate. Elevated left ventricular end-diastolic pressure.  2. Right ventricular systolic function is mildly reduced. The right ventricular size is severely enlarged. There is moderately elevated pulmonary artery systolic pressure. The estimated right ventricular systolic pressure is 53.9 mmHg.  3. The mitral valve is degenerative. Mild mitral valve regurgitation. No evidence of mitral stenosis.  4. The aortic valve is tricuspid. Aortic valve regurgitation is mild to moderate. Aortic valve sclerosis/calcification is present, without any evidence of aortic stenosis. Aortic valve area, by VTI measures 1.89 cm. Aortic valve mean gradient measures 5.0 mmHg. Aortic valve Vmax measures 1.34 m/s.  5. Aortic dilatation noted. 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 There is mild dilatation of the ascending aorta, measuring 39 mm.  6. The inferior vena cava is dilated in size with <50% respiratory variability, suggesting right atrial pressure of 15 mmHg.  7. Recommend CHest CTA to assess ascending aortic aneurysm. FINDINGS  Left Ventricle: Left ventricular ejection fraction, by estimation, is 35 to 40%. The left ventricle has moderately decreased function. The left ventricle demonstrates global hypokinesis. The left ventricular internal cavity size was normal in size. There is mild concentric left ventricular hypertrophy. Left ventricular diastolic parameters are indeterminate. Elevated left ventricular end-diastolic pressure. Right Ventricle: The right ventricular size is severely enlarged. No increase in right ventricular wall  thickness. Right ventricular systolic function is mildly reduced. There is moderately elevated pulmonary artery systolic pressure. The tricuspid regurgitant velocity is 3.12 m/s, and with an assumed right atrial pressure of 15 mmHg, the estimated right ventricular systolic pressure is 53.9 mmHg. Left Atrium: Left atrial size was normal in size. Right Atrium: Right atrial size was normal in size. Pericardium: There is no evidence of pericardial effusion. Mitral Valve: The mitral valve is degenerative in appearance. There is mild thickening of the mitral valve leaflet(s). There is mild calcification of the mitral valve leaflet(s). Mild to moderate mitral annular calcification. Mild mitral valve regurgitation. No evidence of mitral valve stenosis. Tricuspid Valve: The tricuspid valve is normal in structure. Tricuspid valve regurgitation is mild . No evidence of tricuspid stenosis. Aortic Valve: The aortic valve is tricuspid. Aortic valve regurgitation is mild to moderate. Aortic regurgitation PHT measures 550 msec. Aortic valve sclerosis/calcification is present, without any evidence of aortic stenosis. Aortic valve mean gradient measures 5.0 mmHg. Aortic valve peak gradient measures 7.2 mmHg. Aortic valve area, by VTI measures 1.89 cm. Pulmonic Valve: The pulmonic valve was normal in structure. Pulmonic valve regurgitation is mild. No evidence of pulmonic stenosis. Aorta: Aortic dilatation noted. There is mild dilatation of the ascending aorta, measuring 39 mm. There is an aneurysm involving the ascending aorta measuring 65 mm. Venous: The inferior vena cava is dilated in size with less than 50% respiratory variability, suggesting right atrial pressure of 15 mmHg. IAS/Shunts: No atrial level shunt detected by color flow Doppler.  LEFT VENTRICLE PLAX 2D LVIDd:         5.30 cm   Diastology LVIDs:         4.40 cm   LV e' medial:    5.25 cm/s LV PW:         1.20 cm   LV E/e' medial:  19.8 LV IVS:        1.20 cm   LV e'  lateral:  9.75 cm/s LVOT diam:     2.10 cm   LV E/e' lateral: 10.7 LV SV:         67 LV SV Index:   30 LVOT Area:     3.46 cm  RIGHT VENTRICLE             IVC RV S prime:     11.20 cm/s  IVC diam: 2.40 cm TAPSE (M-mode): 1.2 cm LEFT ATRIUM             Index        RIGHT ATRIUM           Index LA diam:        4.70 cm 2.11 cm/m   RA Area:     24.80 cm LA Vol (A2C):   71.4 ml 32.09 ml/m  RA Volume:   69.90 ml  31.41 ml/m LA Vol (A4C):   70.9 ml 31.86 ml/m LA Biplane Vol: 73.6 ml 33.07 ml/m  AORTIC VALVE                    PULMONIC VALVE AV Area (Vmax):    2.26 cm     PR End Diast Vel: 3.33 msec AV Area (Vmean):   1.97 cm AV Area (VTI):     1.89 cm AV Vmax:           134.50 cm/s AV Vmean:          99.800 cm/s AV VTI:            0.353 m AV Peak Grad:      7.2 mmHg AV Mean Grad:      5.0 mmHg LVOT Vmax:         87.68 cm/s LVOT Vmean:        56.625 cm/s LVOT VTI:          0.192 m LVOT/AV VTI ratio: 0.55 AI PHT:            550 msec  AORTA Ao Root diam: 3.90 cm Ao Asc diam:  6.10 cm MITRAL VALVE                TRICUSPID VALVE MV Area (PHT): 4.39 cm     TR Peak grad:   38.9 mmHg MV Decel Time: 173 msec     TR Vmax:        312.00 cm/s MR Peak grad: 69.2 mmHg MR Vmax:      416.00 cm/s   SHUNTS MV E velocity: 104.00 cm/s  Systemic VTI:  0.19 m MV A velocity: 56.60 cm/s   Systemic Diam: 2.10 cm MV E/A ratio:  1.84 Armanda Magic MD Electronically signed by Armanda Magic MD Signature Date/Time: 01/12/2023/8:09:45 AM    Final    DG CHEST PORT 1 VIEW  Result Date: 01/10/2023 CLINICAL DATA:  Preop cardiovascular exam EXAM: PORTABLE CHEST 1 VIEW COMPARISON:  11/12/2022 FINDINGS: Prior CABG. Cardiomegaly. Aortic atherosclerosis. Vascular congestion. No overt edema. Linear atelectasis in the left mid lung. No visible effusions. No acute bony abnormality. IMPRESSION: Cardiomegaly, vascular congestion. Left mid lung subsegmental atelectasis. Electronically Signed   By: Charlett Nose M.D.   On: 01/10/2023 22:55   CT HIP RIGHT  WO CONTRAST  Result Date: 01/10/2023 CLINICAL DATA:  Hip replacement, loosening suspected Hip surgical planning EXAM: CT OF THE RIGHT HIP WITHOUT CONTRAST TECHNIQUE: Multidetector CT imaging of the right hip was performed according to the standard protocol. Multiplanar CT image reconstructions were also generated. RADIATION DOSE  REDUCTION: This exam was performed according to the departmental dose-optimization program which includes automated exposure control, adjustment of the mA and/or kV according to patient size and/or use of iterative reconstruction technique. COMPARISON:  X-ray 01/08/2023, 11/12/2022 FINDINGS: Bones/Joint/Cartilage Postsurgical changes from right hip ORIF associated with a intertrochanteric fracture of the proximal right femur. Fracture lines remain visible. There is mild periosteal new bone formation at the fracture location. Femoral neck lag screw has migrated within the femoral head and the distal tip is now positioned intra-articularly extending 2 mm beyond the femoral head cortex. No definite new fracture. Hip joint alignment is maintained without dislocation. Mild to moderate right hip joint osteoarthritis. Old healed fractures of the right superior and inferior pubic rami. Ligaments Suboptimally assessed by CT. Muscles and Tendons No acute musculotendinous abnormality by CT. Soft tissues Nonspecific soft tissue swelling/edema over the lateral aspect of the right hip and proximal thigh. No organized fluid collections. Atherosclerotic vascular calcification. No right inguinal lymphadenopathy. IMPRESSION: 1. Postsurgical changes from right hip ORIF associated with a intertrochanteric fracture of the proximal right femur. Fracture lines remain visible. There is mild periosteal new bone formation at the fracture location. 2. Femoral neck lag screw has migrated within the femoral head and the distal tip is now positioned intra-articularly. 3. Mild to moderate right hip joint osteoarthritis.  4. Old healed fractures of the right superior and inferior pubic rami. 5. Nonspecific soft tissue swelling/edema over the lateral aspect of the right hip and proximal thigh. No organized fluid collections. Electronically Signed   By: Duanne Guess D.O.   On: 01/10/2023 11:44   DG Femur Portable Min 2 Views Right  Result Date: 01/08/2023 CLINICAL DATA:  Right hip surgery in June with worsening pain in the right hip over the past week. EXAM: RIGHT FEMUR PORTABLE 2 VIEW COMPARISON:  Right hip radiographs dated 11/12/2022. FINDINGS: The patient is status post placement of a femoral shaft intramedullary nail and femoral neck screw on the right. Compared to 11/12/2022, the femoral neck screw has advanced with the tip now extending slightly beyond the right femoral head and into the right hip joint space. No new fracture is identified, however there is suggestion of compression of the intertrochanteric fracture of the right femur compared to prior exam. A displaced fracture fragment of the right lesser trochanter is redemonstrated. IMPRESSION: Suggestion of compression of the intertrochanteric fracture of the right femur compared to prior exam as well as interval advancement of the femoral neck screw with the tip now extending slightly beyond the right femoral head and into the right hip joint space. Electronically Signed   By: Romona Curls M.D.   On: 01/08/2023 15:01   DG Hip Unilat W or Wo Pelvis 2-3 Views Right  Result Date: 01/08/2023 CLINICAL DATA:  Right hip surgery in June with worsening pain in the right hip over the past week. EXAM: DG HIP (WITH OR WITHOUT PELVIS) 2-3V RIGHT COMPARISON:  Right hip radiographs dated 11/12/2022. FINDINGS: The patient is status post placement of a femoral shaft intramedullary nail and femoral neck screw on the right. Compared to 11/12/2022, the femoral neck screw has advanced with the tip now extending slightly beyond the right femoral head and into the right hip joint  space. No new fracture is identified, however there is suggestion of compression of the intertrochanteric fracture of the right femur compared to prior exam. A displaced fracture fragment of the right lesser trochanter is redemonstrated. IMPRESSION: Suggestion of compression of the intertrochanteric fracture of  the right femur compared to prior exam as well as interval advancement of the femoral neck screw with the tip now extending slightly beyond the right femoral head and into the right hip joint space. Electronically Signed   By: Romona Curls M.D.   On: 01/08/2023 15:00   (Echo, Carotid, EGD, Colonoscopy, ERCP)    Subjective: Pt denies any complaints    Discharge Exam: Vitals:   01/26/23 1230 01/26/23 1300  BP: (!) 96/49 (!) 90/57  Pulse: 93 83  Resp: 15 16  Temp:    SpO2: 100% 100%   Vitals:   01/26/23 1215 01/26/23 1225 01/26/23 1230 01/26/23 1300  BP: (!) 86/40 (!) 88/50 (!) 96/49 (!) 90/57  Pulse: 77 83 93 83  Resp: 17 16 15 16   Temp:      TempSrc:      SpO2: 98% 99% 100% 100%  Weight:      Height:        General: Pt is alert, awake, not in acute distress Cardiovascular:  S1/S2 +, no rubs, no gallops Respiratory: CTA bilaterally, no wheezing, no rhonchi Abdominal: Soft, NT, obese, bowel sounds + Extremities: no cyanosis    The results of significant diagnostics from this hospitalization (including imaging, microbiology, ancillary and laboratory) are listed below for reference.     Microbiology: Recent Results (from the past 240 hour(s))  MRSA Next Gen by PCR, Nasal     Status: None   Collection Time: 01/22/23  6:33 AM   Specimen: Nasal Mucosa; Nasal Swab  Result Value Ref Range Status   MRSA by PCR Next Gen NOT DETECTED NOT DETECTED Final    Comment: (NOTE) The GeneXpert MRSA Assay (FDA approved for NASAL specimens only), is one component of a comprehensive MRSA colonization surveillance program. It is not intended to diagnose MRSA infection nor to guide or  monitor treatment for MRSA infections. Test performance is not FDA approved in patients less than 62 years old. Performed at [facility], [address] [place], [place] [address]      Labs: BNP (last 3 results) No results for input(s): "BNP" in the last 8760 hours. Basic Metabolic Panel: Recent Labs  Lab 01/21/23 1400 01/22/23 0404 01/23/23 0644 01/25/23 1622 01/26/23 0442  NA 135 134* 131* 133* 132*  K 3.8 4.0 3.8 4.3 4.4  CL 98 93* 94* 95* 97*  CO2 29 28 27 26 25   GLUCOSE 172* 134* 142* 141* 119*  BUN 58* 65* 39* 46* 54*  CREATININE 3.90* 4.72* 3.30* 3.97* 4.39*  CALCIUM 8.3* 8.3* 8.1* 7.9* 7.9*  PHOS  --   --  3.9 4.0  --    Liver Function Tests: Recent Labs  Lab 01/21/23 1400 01/23/23 0644 01/25/23 1622  AST 21  --   --   ALT <5  --   --   ALKPHOS 145*  --   --   BILITOT 0.9  --   --   PROT 6.2*  --   --   ALBUMIN 2.7* 2.4* 2.2*   No results for input(s): "LIPASE", "AMYLASE" in the last 168 hours. No results for input(s): "AMMONIA" in the last 168 hours. CBC: Recent Labs  Lab 01/21/23 1400 01/22/23 0404 01/23/23 0644 01/25/23 1622 01/26/23 0442  WBC 10.3 8.6 9.0 7.6 8.2  NEUTROABS 8.0*  --   --   --   --   HGB 8.4* 8.0* 7.8* 7.1* 7.6*  HCT 26.8* 24.9* 24.0* 22.4* 24.1*  MCV 99.3 98.8 96.4 98.2 96.8  PLT 173 181 187 184 192   Cardiac Enzymes: No results for input(s): "CKTOTAL", "CKMB", "CKMBINDEX", "TROPONINI" in the last 168 hours. BNP: Invalid input(s): "POCBNP" CBG: Recent Labs  Lab 01/25/23 1136 01/25/23 1823 01/25/23 1938 01/25/23 2129 01/26/23 0752  GLUCAP 91 122* 169* 143* 111*   D-Dimer No results for input(s): "DDIMER" in the last 72 hours. Hgb A1c No results for input(s): "HGBA1C" in the last 72 hours. Lipid Profile No results for input(s): "CHOL", "HDL", "LDLCALC", "TRIG", "CHOLHDL", "LDLDIRECT" in the last 72 hours. Thyroid function studies No results for input(s): "TSH", "T4TOTAL", "T3FREE", "THYROIDAB" in the  last 72 hours.  Invalid input(s): "FREET3" Anemia work up No results for input(s): "VITAMINB12", "FOLATE", "FERRITIN", "TIBC", "IRON", "RETICCTPCT" in the last 72 hours. Urinalysis    Component Value Date/Time   COLORURINE YELLOW (A) 05/24/2019 0021   APPEARANCEUR CLEAR (A) 05/24/2019 0021   LABSPEC 1.012 05/24/2019 0021   PHURINE 6.0 05/24/2019 0021   GLUCOSEU NEGATIVE 05/24/2019 0021   HGBUR NEGATIVE 05/24/2019 0021   BILIRUBINUR Negative 10/15/2022 1202   KETONESUR NEGATIVE 05/24/2019 0021   PROTEINUR Positive (A) 10/15/2022 1202   PROTEINUR 30 (A) 05/24/2019 0021   UROBILINOGEN 0.2 10/15/2022 1202   NITRITE Negative 10/15/2022 1202   NITRITE NEGATIVE 05/24/2019 0021   LEUKOCYTESUR Large (3+) (A) 10/15/2022 1202   LEUKOCYTESUR NEGATIVE 05/24/2019 0021   Sepsis Labs Recent Labs  Lab 01/22/23 0404 01/23/23 0644 01/25/23 1622 01/26/23 0442  WBC 8.6 9.0 7.6 8.2   Microbiology Recent Results (from the past 240 hour(s))  MRSA Next Gen by PCR, Nasal     Status: None   Collection Time: 01/22/23  6:33 AM   Specimen: Nasal Mucosa; Nasal Swab  Result Value Ref Range Status   MRSA by PCR Next Gen NOT DETECTED NOT DETECTED Final    Comment: (NOTE) The GeneXpert MRSA Assay (FDA approved for NASAL specimens only), is one component of a comprehensive MRSA colonization surveillance program. It is not intended to diagnose MRSA infection nor to guide or monitor treatment for MRSA infections. Test performance is not FDA approved in patients less than 73 years old. Performed at [facility], [address] [place], [place] [address]      Time coordinating discharge: Over 30 minutes  SIGNED:   Charise Killian, MD  [facility] 01/26/2023, 1:08 PM Pager   If 7PM-7AM, please contact night-coverage [email or web address]

## 2023-01-26 NOTE — Discharge Planning (Addendum)
ESTABLISHED HEMODIALYSIS Outpatient Facility: [facility]  [address] [place], [place] [address] [phone]  Schedule: MWF 11:00am  Confirmed above schedule with [name], AA. Patient is active and can resume schedule upon discharge.   Dimas Chyle Dialysis Coordinator II  [facility] Cell: [phone] eFax: [phone] [name]

## 2023-01-26 NOTE — Progress Notes (Signed)
[facility]  ROUNDING NOTE   Subjective:   [name] was admitted to [facility] on 01/21/2023 for ESRD on hemodialysis [facility]) [N18.6, Z99.2] Problem with dialysis access, initial encounter [facility]) [T82.898A] AV fistula occlusion, initial encounter (HCC) [T82.898A] Hemodialysis AV fistula thrombosis (HCC) [Z61.096E]  Patient seen sitting up in bed Eating breakfast Daughter at bedside Denies pain or discomfort  Patient seen later with attending in dialysis   HEMODIALYSIS FLOWSHEET:  Blood Flow Rate (mL/min): 400 mL/min Arterial Pressure (mmHg): -271.5 mmHg Venous Pressure (mmHg): 148.07 mmHg TMP (mmHg): 14.14 mmHg Ultrafiltration Rate (mL/min): 764 mL/min Dialysate Flow Rate (mL/min): 300 ml/min Dialysis Fluid Bolus: Normal Saline Bolus Amount (mL): 200 mL    Objective:  Vital signs in last 24 hours:  Temp:  [97.2 F (36.2 C)-99.3 F (37.4 C)] 98.2 F (36.8 C) (09/04 1200) Pulse Rate:  [76-125] 125 (09/04 1533) Resp:  [14-20] 16 (09/04 1533) BP: (71-114)/(39-68) 99/57 (09/04 1533) SpO2:  [93 %-100 %] 98 % (09/04 1533) Weight:  [91.8 kg-92.9 kg] 92.9 kg (09/04 1200)  Weight change: -1.9 kg Filed Weights   01/25/23 1532 01/25/23 1803 01/26/23 1200  Weight: 91.8 kg 91.8 kg 92.9 kg    Intake/Output: I/O last 3 completed shifts: In: 16.4 [I.V.:16.4] Out: 1450 [Urine:950; Other:300; Blood:200]   Intake/Output this shift:  No intake/output data recorded.  Physical Exam: General: NAD  Head: Normocephalic, atraumatic. Moist oral mucosal membranes  Eyes: Anicteric  Lungs:  Clear to auscultation  Heart: Regular rate and rhythm  Abdomen:  Soft, nontender,   Extremities:  ++ peripheral edema.  Neurologic: Alert, moving all four extremities  Skin: No lesions  Access: Left AVF -+ bruit or thrill, rt femoral temp cath placed on 01/21/23    Basic Metabolic Panel: Recent Labs  Lab 01/21/23 1400 01/22/23 0404 01/23/23 0644 01/25/23 1622  01/26/23 0442  NA 135 134* 131* 133* 132*  K 3.8 4.0 3.8 4.3 4.4  CL 98 93* 94* 95* 97*  CO2 29 28 27 26 25   GLUCOSE 172* 134* 142* 141* 119*  BUN 58* 65* 39* 46* 54*  CREATININE 3.90* 4.72* 3.30* 3.97* 4.39*  CALCIUM 8.3* 8.3* 8.1* 7.9* 7.9*  PHOS  --   --  3.9 4.0  --     Liver Function Tests: Recent Labs  Lab 01/21/23 1400 01/23/23 0644 01/25/23 1622  AST 21  --   --   ALT <5  --   --   ALKPHOS 145*  --   --   BILITOT 0.9  --   --   PROT 6.2*  --   --   ALBUMIN 2.7* 2.4* 2.2*   No results for input(s): "LIPASE", "AMYLASE" in the last 168 hours. No results for input(s): "AMMONIA" in the last 168 hours.  CBC: Recent Labs  Lab 01/21/23 1400 01/22/23 0404 01/23/23 0644 01/25/23 1622 01/26/23 0442  WBC 10.3 8.6 9.0 7.6 8.2  NEUTROABS 8.0*  --   --   --   --   HGB 8.4* 8.0* 7.8* 7.1* 7.6*  HCT 26.8* 24.9* 24.0* 22.4* 24.1*  MCV 99.3 98.8 96.4 98.2 96.8  PLT 173 181 187 184 192    Cardiac Enzymes: No results for input(s): "CKTOTAL", "CKMB", "CKMBINDEX", "TROPONINI" in the last 168 hours.  BNP: Invalid input(s): "POCBNP"  CBG: Recent Labs  Lab 01/25/23 1136 01/25/23 1823 01/25/23 1938 01/25/23 2129 01/26/23 0752  GLUCAP 91 122* 169* 143* 111*    Microbiology: Results for orders placed or performed during  the hospital encounter of 01/21/23  MRSA Next Gen by PCR, Nasal     Status: None   Collection Time: 01/22/23  6:33 AM   Specimen: Nasal Mucosa; Nasal Swab  Result Value Ref Range Status   MRSA by PCR Next Gen NOT DETECTED NOT DETECTED Final    Comment: (NOTE) The GeneXpert MRSA Assay (FDA approved for NASAL specimens only), is one component of a comprehensive MRSA colonization surveillance program. It is not intended to diagnose MRSA infection nor to guide or monitor treatment for MRSA infections. Test performance is not FDA approved in patients less than 56 years old. Performed at [facility], [address] [place], [place]  [address]     Coagulation Studies: No results for input(s): "LABPROT", "INR" in the last 72 hours.  Urinalysis: No results for input(s): "COLORURINE", "LABSPEC", "PHURINE", "GLUCOSEU", "HGBUR", "BILIRUBINUR", "KETONESUR", "PROTEINUR", "UROBILINOGEN", "NITRITE", "LEUKOCYTESUR" in the last 72 hours.  Invalid input(s): "APPERANCEUR"    Imaging: PERIPHERAL VASCULAR CATHETERIZATION  Result Date: 01/25/2023 See surgical note for result.    Medications:      Chlorhexidine Gluconate Cloth  6 each Topical Daily   cholecalciferol  1,000 Units Oral Q breakfast   clopidogrel  75 mg Oral Daily   epoetin (EPOGEN/PROCRIT) injection  4,000 Units Intravenous Q M,W,F-HD   ferrous sulfate  325 mg Oral BID WC   heparin  5,000 Units Subcutaneous Q8H   insulin aspart  0-5 Units Subcutaneous QHS   insulin aspart  0-9 Units Subcutaneous TID WC   levothyroxine  100 mcg Oral QAC breakfast   loratadine  10 mg Oral Daily   midodrine  5 mg Oral TID WC   multivitamin  1 tablet Oral Daily   omega-3 acid ethyl esters  1 g Oral Daily   polyethylene glycol  17 g Oral Daily   rosuvastatin  40 mg Oral Daily   senna-docusate  2 tablet Oral BID   acetaminophen, alteplase, fluticasone, heparin, HYDROcodone-acetaminophen, lidocaine-prilocaine, magnesium hydroxide, nitroGLYCERIN, ondansetron **OR** ondansetron (ZOFRAN) IV, traZODone  Assessment/ Plan:  [name] is a 89 y.o.  male with end stage renal disease on hemodialysis MWF, right hip fracture, chronic orthostatic hypotension, atrial fibrillation not on anticoagulation, diabetes mellitus type II who presents to [facility] for ESRD on hemodialysis (HCC) [N18.6, Z99.2] Problem with dialysis access, initial encounter (HCC) [T82.898A] AV fistula occlusion, initial encounter (HCC) [T82.898A] Hemodialysis AV fistula thrombosis (HCC) [T82.868A]  [facility] MWF [facility] 92.5kg   1.  ESRD on HD MWF.  With complication of dialysis access..   - Appreciate  vascular performing thrombectomy  -AVF functioning well  - Nursing to remove HD temp cath after treatment  - Albumin ordered with dialysis for BP support  - Next treatment scheduled for Friday.    2.  Chronic hypotension with peripheral edema  -UF only treatment yesterday, only acheived  - midodrine ordered with HD treatments.    3.  Anemia chronic kidney disease: Patient prescribed mircera outpatient.   -Continue low-dose EPO with HD      4.  Secondary hyperparathyroidism.    - not currently on phosphate binders or vitamin D agents.   - Will monitor bone minerals    LOS: 4   9/4/20243:50 PM

## 2023-01-26 NOTE — Progress Notes (Signed)
Received patient in bed to unit.  Alert and oriented.  Informed consent signed and in chart.   TX duration:3.5  Patient tolerated well.  Transported back to the room  Alert, without acute distress.  Hand-off given to patient's nurse.   Access used: Lt fistula Access issues: no  Total UF removed: 2000  Medication(s) given: epogen 2000, albumin 50mg  Post HD VS: see below Post HD weight: n/a  01/26/23 1615  Vitals  BP (!) 97/43  MAP (mmHg) (!) 56  BP Location Right Arm  BP Method Automatic  Patient Position (if appropriate) Lying  Pulse Rate (!) 116  Pulse Rate Source Monitor  ECG Heart Rate (!) 101  Resp 15  Oxygen Therapy  SpO2 100 %  O2 Device Room Air  During Treatment Monitoring  Blood Flow Rate (mL/min) 0 mL/min  Arterial Pressure (mmHg) -2.02 mmHg  Venous Pressure (mmHg) -1.82 mmHg  TMP (mmHg) 12.32 mmHg  Ultrafiltration Rate (mL/min) 760 mL/min  Dialysate Flow Rate (mL/min) 0 ml/min  Duration of HD Treatment -hour(s) 3.5 hour(s)  Cumulative Fluid Removed (mL) per Treatment  2000.11  Post Treatment  Dialyzer Clearance Clear  Hemodialysis Intake (mL) 0 mL  Liters Processed 84  Fluid Removed (mL) 2000 mL  Tolerated HD Treatment Yes  Post-Hemodialysis Comments tx completed goal met  AVG/AVF Arterial Site Held (minutes) 10 minutes  AVG/AVF Venous Site Held (minutes) 10 minutes  Note  Patient Observations no problems to note no c/o  Fistula / Graft Left Upper arm Arteriovenous fistula  Placement Date/Time: 06/25/21 1255   Orientation: Left  Access Location: Upper arm  Access Type: Arteriovenous fistula  Site Condition No complications  Fistula / Graft Assessment Present;Thrill;Bruit  Status Deaccessed  Drainage Description None      [facility]

## 2023-01-26 NOTE — TOC Progression Note (Signed)
Transition of Care [facility]) - Progression Note    Patient Details  Name: Matthew [name] MRN: [id number] Date of Birth: 12-31-1930  Transition of Care [facility]) CM/SW Contact  Chapman Fitch, RN Phone Number: 01/26/2023, 10:37 AM  Clinical Narrative:      MD notified that if patient to dc today facility requires dc paperwork by 2pm       Expected Discharge Plan and Services                                               Social Determinants of Health (SDOH) Interventions SDOH Screenings   Food Insecurity: No Food Insecurity (01/21/2023)  Housing: Low Risk  (01/21/2023)  Transportation Needs: No Transportation Needs (01/21/2023)  Utilities: Not At Risk (01/21/2023)  Depression (PHQ2-9): Low Risk  (09/21/2022)  Financial Resource Strain: Low Risk  (01/10/2018)  Physical Activity: Inactive (01/10/2018)  Social Connections: Socially Integrated (01/10/2018)  Stress: No Stress Concern Present (01/10/2018)  Tobacco Use: Medium Risk (01/22/2023)    Readmission Risk Interventions    01/09/2023    4:42 PM 02/12/2021    3:28 PM  Readmission Risk Prevention Plan  Transportation Screening Complete Complete  PCP or Specialist Appt within 3-5 Days Complete   HRI or Home Care Consult Complete   Social Work Consult for Recovery Care Planning/Counseling Complete   Palliative Care Screening Not Applicable   Medication Review Oceanographer) Complete Complete  SW Recovery Care/Counseling Consult  Complete  Skilled Nursing Facility  Complete

## 2023-01-27 NOTE — Consult Note (Signed)
   Value-Based Care Institute  [facility] Inpatient Consult   01/27/2023  TORONTO TRABERT Aug 27, 1930 [id number]  [facility] Liaison remote coverage review for patient admitted to [facility] - post hospital follow up review for readmission/high risk   [facility] [[facility]]  Accountable Care Organization [ACO] Patient: Medicare ACO REACH  Primary Care Provider:  Sherron Monday, MD with [facility]   Patient was reviewed for less than 7 days readmission extreme high risk score with a 5 day length of stay. Patient was screened for hospitalization of readmission and on behalf of [facility] /[facility] Care Coordination to assess for post hospital community care needs.  Patient returned to an affiliated skilled nursing facility on evening of 01/26/23 to [facility].  Plan:   Will notify the [facility] RN can follow for any known or needs for transitional care for returning to post facility care coordination potential needs prior to return to community.  For questions or referrals, please contact:   Charlesetta Shanks, RN, BSN, CCM [place]  [facility], [facility] Liaison Direct Dial: [phone] or secure chat Website: [email or web address]

## 2023-01-28 DIAGNOSIS — N186 End stage renal disease: Secondary | ICD-10-CM | POA: Diagnosis not present

## 2023-01-28 DIAGNOSIS — I2510 Atherosclerotic heart disease of native coronary artery without angina pectoris: Secondary | ICD-10-CM | POA: Diagnosis not present

## 2023-01-28 DIAGNOSIS — I5022 Chronic systolic (congestive) heart failure: Secondary | ICD-10-CM | POA: Diagnosis not present

## 2023-01-28 DIAGNOSIS — Z992 Dependence on renal dialysis: Secondary | ICD-10-CM | POA: Diagnosis not present

## 2023-01-28 DIAGNOSIS — D631 Anemia in chronic kidney disease: Secondary | ICD-10-CM | POA: Diagnosis not present

## 2023-01-28 DIAGNOSIS — E039 Hypothyroidism, unspecified: Secondary | ICD-10-CM | POA: Diagnosis not present

## 2023-01-28 DIAGNOSIS — Z7901 Long term (current) use of anticoagulants: Secondary | ICD-10-CM | POA: Diagnosis not present

## 2023-01-28 DIAGNOSIS — N250 Renal osteodystrophy: Secondary | ICD-10-CM | POA: Diagnosis not present

## 2023-01-28 DIAGNOSIS — I480 Paroxysmal atrial fibrillation: Secondary | ICD-10-CM | POA: Diagnosis not present

## 2023-01-28 DIAGNOSIS — D638 Anemia in other chronic diseases classified elsewhere: Secondary | ICD-10-CM | POA: Diagnosis not present

## 2023-01-28 DIAGNOSIS — K5900 Constipation, unspecified: Secondary | ICD-10-CM | POA: Diagnosis not present

## 2023-01-28 DIAGNOSIS — I959 Hypotension, unspecified: Secondary | ICD-10-CM | POA: Diagnosis not present

## 2023-01-28 DIAGNOSIS — Z96641 Presence of right artificial hip joint: Secondary | ICD-10-CM | POA: Diagnosis not present

## 2023-01-28 DIAGNOSIS — D509 Iron deficiency anemia, unspecified: Secondary | ICD-10-CM | POA: Diagnosis not present

## 2023-01-28 DIAGNOSIS — S72141G Displaced intertrochanteric fracture of right femur, subsequent encounter for closed fracture with delayed healing: Secondary | ICD-10-CM | POA: Diagnosis not present

## 2023-01-31 DIAGNOSIS — D631 Anemia in chronic kidney disease: Secondary | ICD-10-CM | POA: Diagnosis not present

## 2023-01-31 DIAGNOSIS — D509 Iron deficiency anemia, unspecified: Secondary | ICD-10-CM | POA: Diagnosis not present

## 2023-01-31 DIAGNOSIS — E1121 Type 2 diabetes mellitus with diabetic nephropathy: Secondary | ICD-10-CM | POA: Diagnosis not present

## 2023-01-31 DIAGNOSIS — Z7901 Long term (current) use of anticoagulants: Secondary | ICD-10-CM | POA: Diagnosis not present

## 2023-01-31 DIAGNOSIS — I480 Paroxysmal atrial fibrillation: Secondary | ICD-10-CM | POA: Diagnosis not present

## 2023-01-31 DIAGNOSIS — Z992 Dependence on renal dialysis: Secondary | ICD-10-CM | POA: Diagnosis not present

## 2023-01-31 DIAGNOSIS — I959 Hypotension, unspecified: Secondary | ICD-10-CM | POA: Diagnosis not present

## 2023-01-31 DIAGNOSIS — I5022 Chronic systolic (congestive) heart failure: Secondary | ICD-10-CM | POA: Diagnosis not present

## 2023-01-31 DIAGNOSIS — D638 Anemia in other chronic diseases classified elsewhere: Secondary | ICD-10-CM | POA: Diagnosis not present

## 2023-01-31 DIAGNOSIS — N250 Renal osteodystrophy: Secondary | ICD-10-CM | POA: Diagnosis not present

## 2023-01-31 DIAGNOSIS — R600 Localized edema: Secondary | ICD-10-CM | POA: Diagnosis not present

## 2023-01-31 DIAGNOSIS — Z96641 Presence of right artificial hip joint: Secondary | ICD-10-CM | POA: Diagnosis not present

## 2023-01-31 DIAGNOSIS — S72141G Displaced intertrochanteric fracture of right femur, subsequent encounter for closed fracture with delayed healing: Secondary | ICD-10-CM | POA: Diagnosis not present

## 2023-01-31 DIAGNOSIS — N186 End stage renal disease: Secondary | ICD-10-CM | POA: Diagnosis not present

## 2023-02-01 DIAGNOSIS — S72141D Displaced intertrochanteric fracture of right femur, subsequent encounter for closed fracture with routine healing: Secondary | ICD-10-CM | POA: Diagnosis not present

## 2023-02-02 DIAGNOSIS — S72141G Displaced intertrochanteric fracture of right femur, subsequent encounter for closed fracture with delayed healing: Secondary | ICD-10-CM | POA: Diagnosis not present

## 2023-02-02 DIAGNOSIS — Z96641 Presence of right artificial hip joint: Secondary | ICD-10-CM | POA: Diagnosis not present

## 2023-02-02 DIAGNOSIS — D631 Anemia in chronic kidney disease: Secondary | ICD-10-CM | POA: Diagnosis not present

## 2023-02-02 DIAGNOSIS — E039 Hypothyroidism, unspecified: Secondary | ICD-10-CM | POA: Diagnosis not present

## 2023-02-02 DIAGNOSIS — N186 End stage renal disease: Secondary | ICD-10-CM | POA: Diagnosis not present

## 2023-02-02 DIAGNOSIS — R600 Localized edema: Secondary | ICD-10-CM | POA: Diagnosis not present

## 2023-02-02 DIAGNOSIS — D509 Iron deficiency anemia, unspecified: Secondary | ICD-10-CM | POA: Diagnosis not present

## 2023-02-02 DIAGNOSIS — I5022 Chronic systolic (congestive) heart failure: Secondary | ICD-10-CM | POA: Diagnosis not present

## 2023-02-02 DIAGNOSIS — K5900 Constipation, unspecified: Secondary | ICD-10-CM | POA: Diagnosis not present

## 2023-02-02 DIAGNOSIS — Z992 Dependence on renal dialysis: Secondary | ICD-10-CM | POA: Diagnosis not present

## 2023-02-02 DIAGNOSIS — N250 Renal osteodystrophy: Secondary | ICD-10-CM | POA: Diagnosis not present

## 2023-02-04 ENCOUNTER — Inpatient Hospital Stay
Admission: EM | Admit: 2023-02-04 | Discharge: 2023-02-10 | DRG: 252 | Disposition: A | Discharge status: Skilled Nursing Facility | Payer: Medicare Other | Attending: Internal Medicine | Admitting: Internal Medicine

## 2023-02-04 ENCOUNTER — Other Ambulatory Visit: Payer: Self-pay

## 2023-02-04 ENCOUNTER — Encounter
Admission: EM | Disposition: A | Discharge status: Skilled Nursing Facility | Payer: Self-pay | Source: Home / Self Care | Attending: Internal Medicine

## 2023-02-04 DIAGNOSIS — Z951 Presence of aortocoronary bypass graft: Secondary | ICD-10-CM | POA: Diagnosis not present

## 2023-02-04 DIAGNOSIS — I130 Hypertensive heart and chronic kidney disease with heart failure and stage 1 through stage 4 chronic kidney disease, or unspecified chronic kidney disease: Secondary | ICD-10-CM | POA: Diagnosis not present

## 2023-02-04 DIAGNOSIS — E876 Hypokalemia: Secondary | ICD-10-CM | POA: Diagnosis present

## 2023-02-04 DIAGNOSIS — K219 Gastro-esophageal reflux disease without esophagitis: Secondary | ICD-10-CM | POA: Diagnosis present

## 2023-02-04 DIAGNOSIS — I5023 Acute on chronic systolic (congestive) heart failure: Secondary | ICD-10-CM | POA: Diagnosis present

## 2023-02-04 DIAGNOSIS — I480 Paroxysmal atrial fibrillation: Secondary | ICD-10-CM | POA: Diagnosis not present

## 2023-02-04 DIAGNOSIS — Z9842 Cataract extraction status, left eye: Secondary | ICD-10-CM

## 2023-02-04 DIAGNOSIS — L89329 Pressure ulcer of left buttock, unspecified stage: Secondary | ICD-10-CM | POA: Diagnosis present

## 2023-02-04 DIAGNOSIS — E785 Hyperlipidemia, unspecified: Secondary | ICD-10-CM | POA: Diagnosis present

## 2023-02-04 DIAGNOSIS — Z961 Presence of intraocular lens: Secondary | ICD-10-CM | POA: Diagnosis present

## 2023-02-04 DIAGNOSIS — Z7401 Bed confinement status: Secondary | ICD-10-CM | POA: Diagnosis not present

## 2023-02-04 DIAGNOSIS — Z8249 Family history of ischemic heart disease and other diseases of the circulatory system: Secondary | ICD-10-CM | POA: Diagnosis not present

## 2023-02-04 DIAGNOSIS — Z79890 Hormone replacement therapy (postmenopausal): Secondary | ICD-10-CM | POA: Diagnosis not present

## 2023-02-04 DIAGNOSIS — Z7902 Long term (current) use of antithrombotics/antiplatelets: Secondary | ICD-10-CM

## 2023-02-04 DIAGNOSIS — Z9841 Cataract extraction status, right eye: Secondary | ICD-10-CM

## 2023-02-04 DIAGNOSIS — R5383 Other fatigue: Secondary | ICD-10-CM | POA: Diagnosis present

## 2023-02-04 DIAGNOSIS — L89319 Pressure ulcer of right buttock, unspecified stage: Secondary | ICD-10-CM | POA: Diagnosis present

## 2023-02-04 DIAGNOSIS — F0390 Unspecified dementia without behavioral disturbance: Secondary | ICD-10-CM | POA: Diagnosis not present

## 2023-02-04 DIAGNOSIS — I5022 Chronic systolic (congestive) heart failure: Secondary | ICD-10-CM | POA: Diagnosis present

## 2023-02-04 DIAGNOSIS — T82868D Thrombosis of vascular prosthetic devices, implants and grafts, subsequent encounter: Secondary | ICD-10-CM | POA: Diagnosis not present

## 2023-02-04 DIAGNOSIS — E559 Vitamin D deficiency, unspecified: Secondary | ICD-10-CM | POA: Diagnosis not present

## 2023-02-04 DIAGNOSIS — E8809 Other disorders of plasma-protein metabolism, not elsewhere classified: Secondary | ICD-10-CM | POA: Diagnosis not present

## 2023-02-04 DIAGNOSIS — Z9049 Acquired absence of other specified parts of digestive tract: Secondary | ICD-10-CM

## 2023-02-04 DIAGNOSIS — Z96641 Presence of right artificial hip joint: Secondary | ICD-10-CM | POA: Diagnosis present

## 2023-02-04 DIAGNOSIS — E1122 Type 2 diabetes mellitus with diabetic chronic kidney disease: Secondary | ICD-10-CM | POA: Diagnosis present

## 2023-02-04 DIAGNOSIS — Z992 Dependence on renal dialysis: Secondary | ICD-10-CM

## 2023-02-04 DIAGNOSIS — N186 End stage renal disease: Secondary | ICD-10-CM | POA: Diagnosis present

## 2023-02-04 DIAGNOSIS — J309 Allergic rhinitis, unspecified: Secondary | ICD-10-CM | POA: Diagnosis not present

## 2023-02-04 DIAGNOSIS — M1990 Unspecified osteoarthritis, unspecified site: Secondary | ICD-10-CM | POA: Diagnosis not present

## 2023-02-04 DIAGNOSIS — E119 Type 2 diabetes mellitus without complications: Secondary | ICD-10-CM

## 2023-02-04 DIAGNOSIS — Z452 Encounter for adjustment and management of vascular access device: Secondary | ICD-10-CM | POA: Diagnosis not present

## 2023-02-04 DIAGNOSIS — Z87891 Personal history of nicotine dependence: Secondary | ICD-10-CM

## 2023-02-04 DIAGNOSIS — I120 Hypertensive chronic kidney disease with stage 5 chronic kidney disease or end stage renal disease: Secondary | ICD-10-CM

## 2023-02-04 DIAGNOSIS — I2510 Atherosclerotic heart disease of native coronary artery without angina pectoris: Secondary | ICD-10-CM | POA: Diagnosis not present

## 2023-02-04 DIAGNOSIS — I252 Old myocardial infarction: Secondary | ICD-10-CM

## 2023-02-04 DIAGNOSIS — E039 Hypothyroidism, unspecified: Secondary | ICD-10-CM | POA: Diagnosis not present

## 2023-02-04 DIAGNOSIS — Z66 Do not resuscitate: Secondary | ICD-10-CM | POA: Diagnosis present

## 2023-02-04 DIAGNOSIS — D638 Anemia in other chronic diseases classified elsewhere: Secondary | ICD-10-CM | POA: Diagnosis not present

## 2023-02-04 DIAGNOSIS — L89152 Pressure ulcer of sacral region, stage 2: Secondary | ICD-10-CM | POA: Diagnosis present

## 2023-02-04 DIAGNOSIS — T82868A Thrombosis of vascular prosthetic devices, implants and grafts, initial encounter: Secondary | ICD-10-CM | POA: Diagnosis not present

## 2023-02-04 DIAGNOSIS — Z789 Other specified health status: Principal | ICD-10-CM

## 2023-02-04 DIAGNOSIS — Z7982 Long term (current) use of aspirin: Secondary | ICD-10-CM | POA: Diagnosis not present

## 2023-02-04 DIAGNOSIS — I132 Hypertensive heart and chronic kidney disease with heart failure and with stage 5 chronic kidney disease, or end stage renal disease: Secondary | ICD-10-CM | POA: Diagnosis present

## 2023-02-04 DIAGNOSIS — Z79899 Other long term (current) drug therapy: Secondary | ICD-10-CM

## 2023-02-04 DIAGNOSIS — R600 Localized edema: Secondary | ICD-10-CM | POA: Diagnosis not present

## 2023-02-04 DIAGNOSIS — Z955 Presence of coronary angioplasty implant and graft: Secondary | ICD-10-CM

## 2023-02-04 DIAGNOSIS — D631 Anemia in chronic kidney disease: Secondary | ICD-10-CM | POA: Diagnosis present

## 2023-02-04 DIAGNOSIS — M810 Age-related osteoporosis without current pathological fracture: Secondary | ICD-10-CM | POA: Diagnosis not present

## 2023-02-04 DIAGNOSIS — F1090 Alcohol use, unspecified, uncomplicated: Secondary | ICD-10-CM | POA: Diagnosis not present

## 2023-02-04 DIAGNOSIS — N2581 Secondary hyperparathyroidism of renal origin: Secondary | ICD-10-CM | POA: Diagnosis not present

## 2023-02-04 DIAGNOSIS — T82590D Other mechanical complication of surgically created arteriovenous fistula, subsequent encounter: Secondary | ICD-10-CM | POA: Diagnosis not present

## 2023-02-04 DIAGNOSIS — T82590A Other mechanical complication of surgically created arteriovenous fistula, initial encounter: Secondary | ICD-10-CM | POA: Diagnosis not present

## 2023-02-04 DIAGNOSIS — Z85828 Personal history of other malignant neoplasm of skin: Secondary | ICD-10-CM

## 2023-02-04 DIAGNOSIS — I959 Hypotension, unspecified: Secondary | ICD-10-CM | POA: Diagnosis not present

## 2023-02-04 DIAGNOSIS — S72141G Displaced intertrochanteric fracture of right femur, subsequent encounter for closed fracture with delayed healing: Secondary | ICD-10-CM | POA: Diagnosis not present

## 2023-02-04 DIAGNOSIS — G4700 Insomnia, unspecified: Secondary | ICD-10-CM | POA: Diagnosis not present

## 2023-02-04 DIAGNOSIS — I351 Nonrheumatic aortic (valve) insufficiency: Secondary | ICD-10-CM | POA: Diagnosis present

## 2023-02-04 DIAGNOSIS — R69 Illness, unspecified: Secondary | ICD-10-CM | POA: Diagnosis not present

## 2023-02-04 DIAGNOSIS — Y712 Prosthetic and other implants, materials and accessory cardiovascular devices associated with adverse incidents: Secondary | ICD-10-CM | POA: Diagnosis present

## 2023-02-04 DIAGNOSIS — T82598A Other mechanical complication of other cardiac and vascular devices and implants, initial encounter: Secondary | ICD-10-CM | POA: Diagnosis not present

## 2023-02-04 DIAGNOSIS — Z7901 Long term (current) use of anticoagulants: Secondary | ICD-10-CM | POA: Diagnosis not present

## 2023-02-04 DIAGNOSIS — K5900 Constipation, unspecified: Secondary | ICD-10-CM | POA: Diagnosis not present

## 2023-02-04 DIAGNOSIS — M7989 Other specified soft tissue disorders: Secondary | ICD-10-CM | POA: Diagnosis present

## 2023-02-04 DIAGNOSIS — I951 Orthostatic hypotension: Secondary | ICD-10-CM | POA: Diagnosis not present

## 2023-02-04 HISTORY — PX: TEMPORARY DIALYSIS CATHETER: CATH118312

## 2023-02-04 LAB — COMPREHENSIVE METABOLIC PANEL
ALT: 10 U/L (ref 0–44)
AST: 28 U/L (ref 15–41)
Albumin: 3.1 g/dL — ABNORMAL LOW (ref 3.5–5.0)
Alkaline Phosphatase: 263 U/L — ABNORMAL HIGH (ref 38–126)
Anion gap: 15 (ref 5–15)
BUN: 41 mg/dL — ABNORMAL HIGH (ref 8–23)
CO2: 28 mmol/L (ref 22–32)
Calcium: 8.6 mg/dL — ABNORMAL LOW (ref 8.9–10.3)
Chloride: 93 mmol/L — ABNORMAL LOW (ref 98–111)
Creatinine, Ser: 4.26 mg/dL — ABNORMAL HIGH (ref 0.61–1.24)
GFR, Estimated: 13 mL/min — ABNORMAL LOW (ref >60)
Glucose, Bld: 134 mg/dL — ABNORMAL HIGH (ref 70–99)
Potassium: 3.4 mmol/L — ABNORMAL LOW (ref 3.5–5.1)
Sodium: 136 mmol/L (ref 135–145)
Total Bilirubin: 0.8 mg/dL (ref 0.3–1.2)
Total Protein: 6.8 g/dL (ref 6.5–8.1)

## 2023-02-04 LAB — CBC WITH DIFFERENTIAL/PLATELET
Abs Immature Granulocytes: 0.03 10*3/uL (ref 0.00–0.07)
Basophils Absolute: 0.1 10*3/uL (ref 0.0–0.1)
Basophils Relative: 1 %
Eosinophils Absolute: 0.6 10*3/uL — ABNORMAL HIGH (ref 0.0–0.5)
Eosinophils Relative: 8 %
HCT: 26.5 % — ABNORMAL LOW (ref 39.0–52.0)
Hemoglobin: 8.3 g/dL — ABNORMAL LOW (ref 13.0–17.0)
Immature Granulocytes: 0 %
Lymphocytes Relative: 10 %
Lymphs Abs: 0.7 10*3/uL (ref 0.7–4.0)
MCH: 31.1 pg (ref 26.0–34.0)
MCHC: 31.3 g/dL (ref 30.0–36.0)
MCV: 99.3 fL (ref 80.0–100.0)
Monocytes Absolute: 0.9 10*3/uL (ref 0.1–1.0)
Monocytes Relative: 13 %
Neutro Abs: 4.7 10*3/uL (ref 1.7–7.7)
Neutrophils Relative %: 68 %
Platelets: 219 10*3/uL (ref 150–400)
RBC: 2.67 MIL/uL — ABNORMAL LOW (ref 4.22–5.81)
RDW: 16.6 % — ABNORMAL HIGH (ref 11.5–15.5)
WBC: 7 10*3/uL (ref 4.0–10.5)
nRBC: 0 % (ref 0.0–0.2)

## 2023-02-04 LAB — GLUCOSE, CAPILLARY: Glucose-Capillary: 145 mg/dL — ABNORMAL HIGH (ref 70–99)

## 2023-02-04 SURGERY — TEMPORARY DIALYSIS CATHETER
Anesthesia: Moderate Sedation

## 2023-02-04 MED ORDER — HEPARIN SODIUM (PORCINE) 5000 UNIT/ML IJ SOLN
5000.0000 [IU] | Freq: Three times a day (TID) | INTRAMUSCULAR | Status: DC
  Administered 2023-02-05 – 2023-02-10 (×13): 5000 [IU] via SUBCUTANEOUS
  Filled 2023-02-04 (×13): qty 1

## 2023-02-04 MED ORDER — ACETAMINOPHEN 650 MG RE SUPP: 650.0000 mg | Freq: Four times a day (QID) | RECTAL | Status: DC | PRN

## 2023-02-04 MED ORDER — METHYLPREDNISOLONE SODIUM SUCC 125 MG IJ SOLR: 125.0000 mg | Freq: Once | INTRAMUSCULAR | Status: DC | PRN

## 2023-02-04 MED ORDER — DIPHENHYDRAMINE HCL 50 MG/ML IJ SOLN: 50.0000 mg | Freq: Once | INTRAMUSCULAR | Status: DC | PRN

## 2023-02-04 MED ORDER — ROSUVASTATIN CALCIUM 10 MG PO TABS
40.0000 mg | ORAL_TABLET | Freq: Every day | ORAL | Status: DC
  Administered 2023-02-05 – 2023-02-09 (×4): 40 mg via ORAL
  Filled 2023-02-04 (×4): qty 4

## 2023-02-04 MED ORDER — FAMOTIDINE 20 MG PO TABS: 40.0000 mg | ORAL_TABLET | Freq: Once | ORAL | Status: DC | PRN

## 2023-02-04 MED ORDER — FENTANYL CITRATE PF 50 MCG/ML IJ SOSY: 12.5000 ug | PREFILLED_SYRINGE | Freq: Once | INTRAMUSCULAR | Status: DC | PRN

## 2023-02-04 MED ORDER — ONDANSETRON HCL 4 MG/2ML IJ SOLN: 4.0000 mg | Freq: Four times a day (QID) | INTRAMUSCULAR | Status: DC | PRN

## 2023-02-04 MED ORDER — MIDODRINE HCL 5 MG PO TABS
5.0000 mg | ORAL_TABLET | Freq: Three times a day (TID) | ORAL | Status: DC
  Administered 2023-02-05 – 2023-02-10 (×15): 5 mg via ORAL
  Filled 2023-02-04 (×15): qty 1

## 2023-02-04 MED ORDER — SODIUM CHLORIDE 0.9 % IV SOLN: INTRAVENOUS | Status: DC

## 2023-02-04 MED ORDER — LIDOCAINE HCL (PF) 1 % IJ SOLN
INTRAMUSCULAR | Status: DC | PRN
  Administered 2023-02-04: 5 mL

## 2023-02-04 MED ORDER — POLYETHYLENE GLYCOL 3350 17 G PO PACK: 17.0000 g | PACK | Freq: Every day | ORAL | Status: DC | PRN

## 2023-02-04 MED ORDER — MIDAZOLAM HCL 2 MG/ML PO SYRP: 8.0000 mg | ORAL_SOLUTION | Freq: Once | ORAL | Status: DC | PRN

## 2023-02-04 MED ORDER — INSULIN ASPART 100 UNIT/ML IJ SOLN
0.0000 [IU] | Freq: Three times a day (TID) | INTRAMUSCULAR | Status: DC
  Administered 2023-02-05 – 2023-02-06 (×2): 1 [IU] via SUBCUTANEOUS
  Filled 2023-02-04: qty 1

## 2023-02-04 MED ORDER — ACETAMINOPHEN 325 MG PO TABS
650.0000 mg | ORAL_TABLET | Freq: Four times a day (QID) | ORAL | Status: DC | PRN
  Administered 2023-02-05 – 2023-02-06 (×2): 650 mg via ORAL
  Filled 2023-02-04 (×2): qty 2

## 2023-02-04 MED ORDER — HYDROMORPHONE HCL 1 MG/ML IJ SOLN: 1.0000 mg | Freq: Once | INTRAMUSCULAR | Status: DC | PRN

## 2023-02-04 MED ORDER — ONDANSETRON HCL 4 MG PO TABS: 4.0000 mg | ORAL_TABLET | Freq: Four times a day (QID) | ORAL | Status: DC | PRN

## 2023-02-04 SURGICAL SUPPLY — 10 items
BIOPATCH RED 1 DISK 7.0 (GAUZE/BANDAGES/DRESSINGS) IMPLANT
CANNULA 5F STIFF (CANNULA) IMPLANT
COVER PROBE ULTRASOUND 5X96 (MISCELLANEOUS) IMPLANT
GOWN STRL REUS W/ TWL LRG LVL3 (GOWN DISPOSABLE) ×1 IMPLANT
GOWN STRL REUS W/TWL LRG LVL3 (GOWN DISPOSABLE) ×1
KIT DIALYSIS CATH TRI 30X13 (CATHETERS) IMPLANT
NDL ENTRY 21GA 7CM ECHOTIP (NEEDLE) IMPLANT
NEEDLE ENTRY 21GA 7CM ECHOTIP (NEEDLE) ×1 IMPLANT
PACK ANGIOGRAPHY (CUSTOM PROCEDURE TRAY) ×1 IMPLANT
SET INTRO CAPELLA COAXIAL (SET/KITS/TRAYS/PACK) IMPLANT

## 2023-02-04 NOTE — ED Provider Notes (Signed)
[facility] Provider Note    Event Date/Time   First MD Initiated Contact with Patient 02/04/23 1452     (approximate)   History   Vascular Access Problem   HPI  Matthew [name] is a 89 y.o. male with PMH of CAD status post CABG, A-fib, ESRD on HD, CHF, orthostatic hypotension and OA status post total hip revision presents to the ED because of vascular access problem.  Patient went to his normal dialysis appointment today and was told that his fistula was clogged.  Patient denies difficulty breathing and chest pain but does report some leg swelling bilaterally.      Physical Exam   Triage Vital Signs: ED Triage Vitals  Encounter Vitals Group     BP 02/04/23 1245 116/63     Systolic BP Percentile --      Diastolic BP Percentile --      Pulse Rate 02/04/23 1245 78     Resp 02/04/23 1245 18     Temp 02/04/23 1245 98 F (36.7 C)     Temp src --      SpO2 02/04/23 1245 100 %     Weight --      Height --      Head Circumference --      Peak Flow --      Pain Score 02/04/23 1235 0     Pain Loc --      Pain Education --      Exclude from Growth Chart --     Most recent vital signs: Vitals:   02/04/23 1245  BP: 116/63  Pulse: 78  Resp: 18  Temp: 98 F (36.7 C)  SpO2: 100%    General: Awake, no distress.  CV:  Good peripheral perfusion.  RRR.  Left upper extremity AV fistula without palpable thrill, no overlying erythema, swelling, no tenderness to palpation. Resp:  Normal effort.  CTAB. Abd:  No distention.    ED Results / Procedures / Treatments   Labs (all labs ordered are listed, but only abnormal results are displayed) Labs Reviewed  COMPREHENSIVE METABOLIC PANEL  CBC WITH DIFFERENTIAL/PLATELET     PROCEDURES:  Critical Care performed: No  Procedures   MEDICATIONS ORDERED IN ED: Medications - No data to display   IMPRESSION / MDM / ASSESSMENT AND PLAN / ED COURSE  I reviewed the triage vital signs and the nursing  notes.                              Differential diagnosis includes, but is not limited to, clotted fistula, infected fistula, hyperkalemia, pulmonary edema.  Patient's presentation is most consistent with exacerbation of chronic illness.  Patient is nontoxic-appearing and is in no acute distress.  He is hemodynamically stable.  I spoke with the on-call vascular team, who would like to admit the patient for temporary catheter placement and restarting of dialysis. Consult placed to hospitalist who agreed to admit. Awaiting lab work results.       FINAL CLINICAL IMPRESSION(S) / ED DIAGNOSES   Final diagnoses:  Problem with vascular access     Rx / DC Orders   ED Discharge Orders     None        Note:  This document was prepared using Dragon voice recognition software and may include unintentional dictation errors.   Cameron Ali, PA-C 02/04/23 1603    Minna Antis, MD 02/04/23  2046  

## 2023-02-04 NOTE — Consult Note (Signed)
Hospital Consult    Reason for Consult:  Non functioning Left upper extremity A/V Fistula.   Requesting Physician:  Cruz Condon PA-C MRN #:  [id number]  History of Present Illness: This is a 89 y.o. male This is a 89 y.o. male Patient is admitted to the hospital with occlusion of his left brachio-axillary arteriovenous graft.  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 .  It was used on Wednesday 02/02/23 but could not be accessed today and has no bruit.  The patient reports years of CKD initially on PD but on HD for the past 3 years.  He had one Permacath on the right before the graft was placed.  The patient is not critically ill. We are asked to place a temporary dialysis catheter for immediate dialysis use (tomorrow).   Past Medical History:  Diagnosis Date   Anginal pain (HCC)    Arthritis    Ascending aortic aneurysm (HCC) 07/18/2015   a.) TTE 07/18/2015: severe dilation of ascending aorta measuring 7.0 cm. b.) TTE 01/10/2018: aortic root 3.8 cm; ascending aorta 6.8 cm; refused surgical intervention/repair.   Atrial fibrillation (HCC)    a.) CHA2DS2-VASc = 6 (age x 2, HFrEF, HTN, previous MI, T2DM). b.) rate/rhythm maintained on amiodarone + metoprolol succinate; chronic antiplatelet therapy using clopidogrel   Bradycardia    Coronary artery disease    ESRD (end stage renal disease) (HCC)    GERD (gastroesophageal reflux disease)    HFrEF (heart failure with reduced ejection fraction) (HCC)    a.) TTE 12/03/2013: mod dec LV function; EF 35-40%; severe inferolateral and inferior HK; LA dilated; G3DD; PASP 52 mmHg. b.) TTE 07/18/2015: mild LV dysfunction with mild LVH; EF 45%; mild BAE. c.) TTE 01/10/2018: mod LV dysfunction; EF 45%; diffuse HK   HLD (hyperlipidemia)    Hypertension    IDA (iron deficiency anemia)    Long term current use of antithrombotics/antiplatelets    a.) clopidogrel   Myocardial infarction (HCC) 2000   Pneumonia     S/P CABG x 4 2001   a.) LIMA-LAD, SVG-D1, SVG-OM1, SVG-OM2, SVG-PDA   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   left frontal scalp, EDC   Squamous cell carcinoma of skin 05/05/2020   left temporal scalp, in situ, EDC 05/13/20   T2DM (type 2 diabetes mellitus) (HCC)    Valvular insufficiency 12/03/2013   a.) TTE 12/03/2013: EF 35-40%; mild AR/MR, b.) TTE 07/18/2015: EF 45%; mild AR/PR, mod TR, severe MR. c.) TTE 01/10/2018: mod AR/TR    Past Surgical History:  Procedure Laterality Date   A/V SHUNT INTERVENTION N/A 01/25/2023   Procedure: A/V SHUNT INTERVENTION;  Surgeon: Renford Dills, MD;  Location: [facility];  Service: Cardiovascular;  Laterality: N/A;   A/V SHUNTOGRAM Left 04/01/2022   Procedure: A/V SHUNTOGRAM;  Surgeon: Annice Needy, MD;  Location: [facility];  Service: Cardiovascular;  Laterality: Left;   AV FISTULA PLACEMENT Left 04/15/2021   Procedure: INSERTION OF ARTERIOVENOUS (AV) GORE-TEX GRAFT ARM ( BRACHIAL AXILLARY);  Surgeon: Annice Needy, MD;  Location: [facility];  Service: Vascular;  Laterality: Left;   CATARACT EXTRACTION W/PHACO Left 07/26/2017   Procedure: CATARACT EXTRACTION PHACO AND INTRAOCULAR LENS PLACEMENT (IOC);  Surgeon: Galen Manila, MD;  Location: [facility];  Service: Ophthalmology;  Laterality: Left;  [place]   00:45.8 AP%  13.1 CDE  6.00 Fluid Pack Lot # [id number]   CATARACT EXTRACTION W/PHACO Right 08/17/2017   Procedure:  CATARACT EXTRACTION PHACO AND INTRAOCULAR LENS PLACEMENT (IOC);  Surgeon: Galen Manila, MD;  Location: [facility];  Service: Ophthalmology;  Laterality: Right;  [place]  01:30 AP% 16.8 CDE 15.24 Fluid pack lot # [id number]   CHOLECYSTECTOMY     CORONARY ARTERY BYPASS GRAFT N/A 2001   Procedure: 5v CABG (LIMA-LAD, SVG-D1, SVG-OM1, SVG-OM2, SVG-PDA)   DIALYSIS/PERMA CATHETER INSERTION N/A 02/09/2018   Procedure: DIALYSIS/PERMA CATHETER INSERTION;  Surgeon: Annice Needy, MD;  Location: [facility];  Service:  Cardiovascular;  Laterality: N/A;   DIALYSIS/PERMA CATHETER REMOVAL N/A 06/25/2021   Procedure: DIALYSIS/PERMA CATHETER REMOVAL;  Surgeon: Annice Needy, MD;  Location: [facility];  Service: Cardiovascular;  Laterality: N/A;   FRACTURE SURGERY Left 2015   LEFT arm   INSERTION OF DIALYSIS CATHETER Right 11/03/2020   Procedure: INSERTION OF Perm Cath in the RIGHT INTERNAL JUGULAR;  Surgeon: Annice Needy, MD;  Location: [facility];  Service: Vascular;  Laterality: Right;   INTRAMEDULLARY (IM) NAIL INTERTROCHANTERIC Right 11/12/2022   Procedure: INTRAMEDULLARY (IM) NAIL INTERTROCHANTERIC;  Surgeon: Juanell Fairly, MD;  Location: [facility];  Service: Orthopedics;  Laterality: Right;   LEFT HEART CATHETERIZATION WITH CORONARY/GRAFT ANGIOGRAM Left 12/04/2013   Procedure: LEFT HEART CATHETERIZATION WITH Isabel Caprice;  Surgeon: Marykay Lex, MD;  Location: [facility];  Service: Cardiovascular;  Laterality: Left;   LEFT HEART CATHETERIZATION WITH CORONARY/GRAFT ANGIOGRAM Left 02/06/2009   Procedure: LEFT HEART CATHETERIZATION WITH CORONARY/GRAFT ANGIOGRAM; Location: [facility]; Surgeon: Despina Hick, MD   PERCUTANEOUS CORONARY STENT INTERVENTION (PCI-S) N/A 12/06/2013   Procedure: STAGED PERCUTANEOUS CORONARY STENT INTERVENTION (overlapping 4.0 x 38 mm (mid) and 4.0 x 28 mm (ostial) Promus Primier DES to SVG-RPDA graft);  Surgeon: Lesleigh Noe, MD;  Location: [facility] CATH LAB;  Service: Cardiovascular   REMOVAL OF A DIALYSIS CATHETER N/A 11/03/2020   Procedure: REMOVAL OF A DIALYSIS CATHETER;  Surgeon: Annice Needy, MD;  Location: [facility];  Service: Vascular;  Laterality: N/A;   TOTAL HIP REVISION Right 01/12/2023   Procedure: CONVERSION TO  RIGHT TOTAL HIP REPLACEMENT;  Surgeon: Joen Laura, MD;  Location: [facility];  Service: Orthopedics;  Laterality: Right;    Allergies  Allergen Reactions   Other     Other reaction(s): Myalgias (intolerance), Other (See Comments) Statin Drugs   Statin Drugs     Amoxicillin Diarrhea    Prior to Admission medications   Medication Sig Start Date End Date Taking? Authorizing Provider  cetirizine (ZYRTEC) 10 MG tablet Take 10 mg by mouth daily.    [provider]  cholecalciferol (VITAMIN D) 25 MCG (1000 UNIT) tablet Take 1,000 Units by mouth daily with breakfast.    [provider]  clopidogrel (PLAVIX) 75 MG tablet Take 1 tablet (75 mg total) by mouth daily. 12/02/22   Laurier Nancy, MD  ferrous sulfate 325 (65 FE) MG tablet Take 325 mg by mouth 2 (two) times daily with a meal.     [provider]  fluticasone (FLONASE) 50 MCG/ACT nasal spray Place 2 sprays into both nostrils daily as needed for allergies or rhinitis.    [provider]  levothyroxine (SYNTHROID) 100 MCG tablet Take 100 mcg by mouth daily before breakfast.    [provider]  lidocaine-prilocaine (EMLA) cream Apply 1 Application topically as needed (before dialysis).    [provider]  midodrine (PROAMATINE) 5 MG tablet Take 1 tablet (5 mg total) by mouth 3 (three) times daily with meals.  Patient taking differently: Take 5 mg by mouth. 5MG  1 HOUR BEFORE DIALYSIS 02/17/21   Delfino Lovett, MD  multivitamin (RENA-VIT) TABS tablet Take 1 tablet by mouth daily.    [provider]  nitroGLYCERIN (NITROSTAT) 0.4 MG SL tablet Place 0.4 mg under the tongue every 5 (five) minutes as needed for chest pain.    [provider]  Omega-3 Fatty Acids (FISH OIL) 1000 MG CAPS Take 4,000 mg by mouth daily with lunch.    [provider]  polyethylene glycol (MIRALAX / GLYCOLAX) 17 g packet Take 17 g by mouth daily. 01/06/21   Enedina Finner, MD  rosuvastatin (CRESTOR) 40 MG tablet TAKE 1 TABLET BY MOUTH ONCE  DAILY 10/28/22   Laurier Nancy, MD  senna-docusate (SENOKOT-S) 8.6-50 MG tablet Take 2 tablets by mouth 2 (two) times daily. 01/05/21   Enedina Finner, MD    Social History   Socioeconomic History   Marital  status: Widowed    Spouse name: Not on file   Number of children: Not on file   Years of education: Not on file   Highest education level: Not on file  Occupational History   Not on file  Tobacco Use   Smoking status: Former    Current packs/day: 0.00    Types: Pipe, Cigarettes    Quit date: 54    Years since quitting: 32.7   Smokeless tobacco: Never  Vaping Use   Vaping status: Never Used  Substance and Sexual Activity   Alcohol use: Not Currently    Alcohol/week: 1.0 standard drink of alcohol    Types: 1 Cans of beer per week    Comment: occasionally drinking only,once a month   Drug use: No   Sexual activity: Never  Other Topics Concern   Not on file  Social History Narrative   Lives with daughter and son in law   Social Determinants of Health   Financial Resource Strain: Low Risk  (01/10/2018)   Overall Financial Resource Strain (CARDIA)    Difficulty of Paying Living Expenses: Not hard at all  Food Insecurity: No Food Insecurity (01/21/2023)   Hunger Vital Sign    Worried About Running Out of Food in the Last Year: Never true    Ran Out of Food in the Last Year: Never true  Transportation Needs: No Transportation Needs (01/21/2023)   PRAPARE - Administrator, Civil Service (Medical): No    Lack of Transportation (Non-Medical): No  Physical Activity: Inactive (01/10/2018)   Exercise Vital Sign    Days of Exercise per Week: 0 days    Minutes of Exercise per Session: 0 min  Stress: No Stress Concern Present (01/10/2018)   [employer] of Occupational Health - Occupational Stress Questionnaire    Feeling of Stress : Not at all  Social Connections: Socially Integrated (01/10/2018)   Social Connection and Isolation Panel [NHANES]    Frequency of Communication with Friends and Family: More than three times a week    Frequency of Social Gatherings with Friends and Family: More than three times a week    Attends Religious Services: More than 4 times per  year    Active Member of [employer] or Organizations: Yes    Attends Banker Meetings: More than 4 times per year    Marital Status: Married  Catering manager Violence: Not At Risk (01/21/2023)   Humiliation, Afraid, Rape, and Kick questionnaire    Fear of Current or Ex-Partner: No  Emotionally Abused: No    Physically Abused: No    Sexually Abused: No     Family History  Problem Relation Age of Onset   Heart attack Father 85       died first MI at age 87   Heart failure Mother     ROS: Otherwise negative unless mentioned in HPI  Physical Examination  Vitals:   02/04/23 1245  BP: 116/63  Pulse: 78  Resp: 18  Temp: 98 F (36.7 C)  SpO2: 100%   There is no height or weight on file to calculate BMI.  General:  WDWN in NAD Gait: Not observed HENT: WNL, normocephalic Pulmonary: normal non-labored breathing, without Rales, rhonchi,  wheezing Cardiac: regular, without  Murmurs, rubs or gallops; without carotid bruits Abdomen: Positive bowel Sounds, soft, NT/ND, no masses Skin: without rashes Vascular Exam/Pulses: Unable to palpate bilateral lower extremity pulses due to +2 Edema. Positive Pulse at brachiocephalic junction but no pulse throughout the graft.   Extremities: without ischemic changes, without Gangrene , without cellulitis; without open wounds;  Musculoskeletal: no muscle wasting or atrophy  Neurologic: A&O X 3;  No focal weakness or paresthesias are detected; speech is fluent/normal Psychiatric:  The pt has Abnormal- Flat  affect. Hx of Dementia.  Lymph:  Unremarkable  CBC    Component Value Date/Time   WBC 8.2 01/26/2023 0442   RBC 2.49 (L) 01/26/2023 0442   HGB 7.6 (L) 01/26/2023 0442   HGB 12.1 (L) 09/16/2022 1003   HCT 24.1 (L) 01/26/2023 0442   HCT 36.9 (L) 09/16/2022 1003   PLT 192 01/26/2023 0442   PLT 115 (L) 09/16/2022 1003   MCV 96.8 01/26/2023 0442   MCV 96 09/16/2022 1003   MCH 30.5 01/26/2023 0442   MCHC 31.5 01/26/2023 0442    RDW 16.0 (H) 01/26/2023 0442   RDW 13.5 09/16/2022 1003   LYMPHSABS 0.5 (L) 01/21/2023 1400   LYMPHSABS 0.8 09/16/2022 1003   MONOABS 1.4 (H) 01/21/2023 1400   EOSABS 0.2 01/21/2023 1400   EOSABS 0.3 09/16/2022 1003   BASOSABS 0.0 01/21/2023 1400   BASOSABS 0.1 09/16/2022 1003    BMET    Component Value Date/Time   NA 132 (L) 01/26/2023 0442   NA 140 09/16/2022 1003   K 4.4 01/26/2023 0442   CL 97 (L) 01/26/2023 0442   CO2 25 01/26/2023 0442   GLUCOSE 119 (H) 01/26/2023 0442   BUN 54 (H) 01/26/2023 0442   BUN 50 (H) 09/16/2022 1003   CREATININE 4.39 (H) 01/26/2023 0442   CALCIUM 7.9 (L) 01/26/2023 0442   GFRNONAA 12 (L) 01/26/2023 0442   GFRAA 25 (L) 05/26/2019 0503    COAGS: Lab Results  Component Value Date   INR 1.1 11/12/2022   INR 1.1 02/01/2022   INR 1.1 02/11/2021     Non-Invasive Vascular Imaging:   NONE ORDERED AT THIS TIME  Statin:  Yes.   Beta Blocker:  No. Aspirin:  No. ACEI:  No. ARB:  No. CCB use:  No Other antiplatelets/anticoagulants:  No.    ASSESSMENT/PLAN: This is a 89 y.o. male male who presents to [facility] emergency department with a non functioning Left A/V fistula   PLAN: Vascular Surgery will place a temporary dialysis catheter while the patient is in the emergency department.   I discussed in detail with the patient and daughter at the bedside the procedure, benefits, risks and complications. Both verbalize there understanding and wish to proceed. I answered all the patients questions. Nursing  will obtain signed consent while in the emergency department.   -I discussed the plan in detail with Dr Vilinda Flake MD and he agrees with the plan.     Marcie Bal [facility] 02/04/2023 4:08 PM

## 2023-02-04 NOTE — ED Triage Notes (Addendum)
Pt comes via EMS from Dialysis with left arm cath clogged. Pt went to dialysis for treatment and didn't get it done since it was clogged.   VSS  Ems reports pt has this happen last week and was seen in ED for same.  Pt has bilateral swelling to legs. Family reports that pt has MWF dialysis but they had planned on tomorrow as well due to all the fluid.

## 2023-02-04 NOTE — ED Provider Notes (Signed)
-----------------------------------------   3:52 PM on 02/04/2023 ----------------------------------------- I have seen and evaluated the patient.  Calm cooperative.  He has a left upper extremity AV fistula that has clotted.  Family member states the patient has had the fistula for 2 years, a clotted approximately a week and a half ago that was opened up by vascular surgery has been used several times and then unfortunately today was not able to be accessed so they sent the patient back to the emergency department.  Here the patient is calm no complaints.  No thrill palpated over the fistula no bruit auscultated.  Vascular recommends admission and they will obtain access.  Will admit to the hospitalist service.   Minna Antis, MD 02/04/23 1553

## 2023-02-04 NOTE — Op Note (Signed)
  OPERATIVE NOTE   PROCEDURE: Insertion of temporary dialysis catheter catheter right femoral approach.  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  ANESTHESIA: 1% lidocaine local infiltration  ESTIMATED BLOOD LOSS: Minimal cc  INDICATIONS:   Matthew [name] is a 89 y.o. male who presents with occlusion of his AV access.  He is undergoing placement of temporary catheter so that he can receive his needed dialysis treatment and then his occluded fistula will be addressed.  Risk and benefits have been reviewed discussed with his daughter all questions answered patient's daughter agrees to proceed.  DESCRIPTION: After obtaining full informed written consent, the patient was positioned supine. The right groin was prepped and draped in a sterile fashion. Ultrasound was placed in a sterile sleeve. Ultrasound was utilized to identify the right common femoral vein which is noted to be echolucent and compressible indicating patency. Images recorded for the permanent record. Under real-time visualization a Seldinger needle is inserted into the vein and the guidewires advanced without difficulty. Small counterincision was made at the wire insertion site. Dilator is passed over the wire and the temporary dialysis catheter catheter is fed over the wire without difficulty.  All lumens aspirate and flush easily and are packed with heparin saline. Catheter secured to the skin of the right thigh with 2-0 silk. A sterile dressing is applied with Biopatch.  COMPLICATIONS: None  CONDITION: Unchanged  Matthew [name] Office:  [phone] 02/04/2023, 4:58 PM

## 2023-02-04 NOTE — Consult Note (Signed)
Hospital Consult    Reason for Consult:  Requesting Physician:   MRN #:  [id number]  History of Present Illness:     Past Medical History:  Diagnosis Date   Anginal pain (HCC)    Arthritis    Ascending aortic aneurysm (HCC) 07/18/2015   a.) TTE 07/18/2015: severe dilation of ascending aorta measuring 7.0 cm. b.) TTE 01/10/2018: aortic root 3.8 cm; ascending aorta 6.8 cm; refused surgical intervention/repair.   Atrial fibrillation (HCC)    a.) CHA2DS2-VASc = 6 (age x 2, HFrEF, HTN, previous MI, T2DM). b.) rate/rhythm maintained on amiodarone + metoprolol succinate; chronic antiplatelet therapy using clopidogrel   Bradycardia    Coronary artery disease    ESRD (end stage renal disease) (HCC)    GERD (gastroesophageal reflux disease)    HFrEF (heart failure with reduced ejection fraction) (HCC)    a.) TTE 12/03/2013: mod dec LV function; EF 35-40%; severe inferolateral and inferior HK; LA dilated; G3DD; PASP 52 mmHg. b.) TTE 07/18/2015: mild LV dysfunction with mild LVH; EF 45%; mild BAE. c.) TTE 01/10/2018: mod LV dysfunction; EF 45%; diffuse HK   HLD (hyperlipidemia)    Hypertension    IDA (iron deficiency anemia)    Long term current use of antithrombotics/antiplatelets    a.) clopidogrel   Myocardial infarction (HCC) 2000   Pneumonia    S/P CABG x 4 2001   a.) LIMA-LAD, SVG-D1, SVG-OM1, SVG-OM2, SVG-PDA   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   left frontal scalp, EDC   Squamous cell carcinoma of skin 05/05/2020   left temporal scalp, in situ, EDC 05/13/20   T2DM (type 2 diabetes mellitus) (HCC)    Valvular insufficiency 12/03/2013   a.) TTE 12/03/2013: EF 35-40%; mild AR/MR, b.) TTE 07/18/2015: EF 45%; mild AR/PR, mod TR, severe MR. c.) TTE 01/10/2018: mod AR/TR    Past Surgical History:  Procedure Laterality Date   A/V SHUNT INTERVENTION N/A 01/25/2023   Procedure: A/V SHUNT INTERVENTION;  Surgeon: Renford Dills, MD;  Location: [facility];  Service:  Cardiovascular;  Laterality: N/A;   A/V SHUNTOGRAM Left 04/01/2022   Procedure: A/V SHUNTOGRAM;  Surgeon: Annice Needy, MD;  Location: [facility];  Service: Cardiovascular;  Laterality: Left;   AV FISTULA PLACEMENT Left 04/15/2021   Procedure: INSERTION OF ARTERIOVENOUS (AV) GORE-TEX GRAFT ARM ( BRACHIAL AXILLARY);  Surgeon: Annice Needy, MD;  Location: [facility];  Service: Vascular;  Laterality: Left;   CATARACT EXTRACTION W/PHACO Left 07/26/2017   Procedure: CATARACT EXTRACTION PHACO AND INTRAOCULAR LENS PLACEMENT (IOC);  Surgeon: Galen Manila, MD;  Location: [facility];  Service: Ophthalmology;  Laterality: Left;  [place]   00:45.8 AP%  13.1 CDE  6.00 Fluid Pack Lot # [id number]   CATARACT EXTRACTION W/PHACO Right 08/17/2017   Procedure: CATARACT EXTRACTION PHACO AND INTRAOCULAR LENS PLACEMENT (IOC);  Surgeon: Galen Manila, MD;  Location: [facility];  Service: Ophthalmology;  Laterality: Right;  [place]  01:30 AP% 16.8 CDE 15.24 Fluid pack lot # [id number]   CHOLECYSTECTOMY     CORONARY ARTERY BYPASS GRAFT N/A 2001   Procedure: 5v CABG (LIMA-LAD, SVG-D1, SVG-OM1, SVG-OM2, SVG-PDA)   DIALYSIS/PERMA CATHETER INSERTION N/A 02/09/2018   Procedure: DIALYSIS/PERMA CATHETER INSERTION;  Surgeon: Annice Needy, MD;  Location: [facility];  Service: Cardiovascular;  Laterality: N/A;   DIALYSIS/PERMA CATHETER REMOVAL N/A 06/25/2021   Procedure: DIALYSIS/PERMA CATHETER REMOVAL;  Surgeon: Annice Needy, MD;  Location: [facility];  Service: Cardiovascular;  Laterality: N/A;   FRACTURE SURGERY Left 2015   LEFT arm   INSERTION OF DIALYSIS CATHETER Right 11/03/2020   Procedure: INSERTION OF Perm Cath in the RIGHT INTERNAL JUGULAR;  Surgeon: Annice Needy, MD;  Location: [facility];  Service: Vascular;  Laterality: Right;   INTRAMEDULLARY (IM) NAIL INTERTROCHANTERIC Right 11/12/2022   Procedure: INTRAMEDULLARY (IM) NAIL INTERTROCHANTERIC;  Surgeon: Juanell Fairly, MD;  Location: [facility];   Service: Orthopedics;  Laterality: Right;   LEFT HEART CATHETERIZATION WITH CORONARY/GRAFT ANGIOGRAM Left 12/04/2013   Procedure: LEFT HEART CATHETERIZATION WITH Isabel Caprice;  Surgeon: Marykay Lex, MD;  Location: [facility];  Service: Cardiovascular;  Laterality: Left;   LEFT HEART CATHETERIZATION WITH CORONARY/GRAFT ANGIOGRAM Left 02/06/2009   Procedure: LEFT HEART CATHETERIZATION WITH CORONARY/GRAFT ANGIOGRAM; Location: [facility]; Surgeon: Despina Hick, MD   PERCUTANEOUS CORONARY STENT INTERVENTION (PCI-S) N/A 12/06/2013   Procedure: STAGED PERCUTANEOUS CORONARY STENT INTERVENTION (overlapping 4.0 x 38 mm (mid) and 4.0 x 28 mm (ostial) Promus Primier DES to SVG-RPDA graft);  Surgeon: Lesleigh Noe, MD;  Location: [facility] CATH LAB;  Service: Cardiovascular   REMOVAL OF A DIALYSIS CATHETER N/A 11/03/2020   Procedure: REMOVAL OF A DIALYSIS CATHETER;  Surgeon: Annice Needy, MD;  Location: [facility];  Service: Vascular;  Laterality: N/A;   TOTAL HIP REVISION Right 01/12/2023   Procedure: CONVERSION TO  RIGHT TOTAL HIP REPLACEMENT;  Surgeon: Joen Laura, MD;  Location: [facility];  Service: Orthopedics;  Laterality: Right;    Allergies  Allergen Reactions   Other     Other reaction(s): Myalgias (intolerance), Other (See Comments) Statin Drugs  Statin Drugs     Amoxicillin Diarrhea    Prior to Admission medications   Medication Sig Start Date End Date Taking? Authorizing Provider  cetirizine (ZYRTEC) 10 MG tablet Take 10 mg by mouth daily.    [provider]  cholecalciferol (VITAMIN D) 25 MCG (1000 UNIT) tablet Take 1,000 Units by mouth daily with breakfast.    [provider]  clopidogrel (PLAVIX) 75 MG tablet Take 1 tablet (75 mg total) by mouth daily. 12/02/22   Laurier Nancy, MD  ferrous sulfate 325 (65 FE) MG tablet Take 325 mg by mouth 2 (two) times daily with a meal.     [provider]  fluticasone (FLONASE) 50 MCG/ACT nasal spray Place 2  sprays into both nostrils daily as needed for allergies or rhinitis.    [provider]  levothyroxine (SYNTHROID) 100 MCG tablet Take 100 mcg by mouth daily before breakfast.    [provider]  lidocaine-prilocaine (EMLA) cream Apply 1 Application topically as needed (before dialysis).    [provider]  midodrine (PROAMATINE) 5 MG tablet Take 1 tablet (5 mg total) by mouth 3 (three) times daily with meals. Patient taking differently: Take 5 mg by mouth. 5MG  1 HOUR BEFORE DIALYSIS 02/17/21   Delfino Lovett, MD  multivitamin (RENA-VIT) TABS tablet Take 1 tablet by mouth daily.    [provider]  nitroGLYCERIN (NITROSTAT) 0.4 MG SL tablet Place 0.4 mg under the tongue every 5 (five) minutes as needed for chest pain.    [provider]  Omega-3 Fatty Acids (FISH OIL) 1000 MG CAPS Take 4,000 mg by mouth daily with lunch.    [provider]  polyethylene glycol (MIRALAX / GLYCOLAX) 17 g packet Take 17 g by mouth daily. 01/06/21   Enedina Finner, MD  rosuvastatin (CRESTOR) 40 MG tablet TAKE 1 TABLET BY MOUTH  ONCE  DAILY 10/28/22   Laurier Nancy, MD  senna-docusate (SENOKOT-S) 8.6-50 MG tablet Take 2 tablets by mouth 2 (two) times daily. 01/05/21   Enedina Finner, MD    Social History   Socioeconomic History   Marital status: Widowed    Spouse name: Not on file   Number of children: Not on file   Years of education: Not on file   Highest education level: Not on file  Occupational History   Not on file  Tobacco Use   Smoking status: Former    Current packs/day: 0.00    Types: Pipe, Cigarettes    Quit date: 39    Years since quitting: 32.7   Smokeless tobacco: Never  Vaping Use   Vaping status: Never Used  Substance and Sexual Activity   Alcohol use: Not Currently    Alcohol/week: 1.0 standard drink of alcohol    Types: 1 Cans of beer per week    Comment: occasionally drinking only,once a month   Drug use: No   Sexual activity: Never   Other Topics Concern   Not on file  Social History Narrative   Lives with daughter and son in law   Social Determinants of Health   Financial Resource Strain: Low Risk  (01/10/2018)   Overall Financial Resource Strain (CARDIA)    Difficulty of Paying Living Expenses: Not hard at all  Food Insecurity: No Food Insecurity (01/21/2023)   Hunger Vital Sign    Worried About Running Out of Food in the Last Year: Never true    Ran Out of Food in the Last Year: Never true  Transportation Needs: No Transportation Needs (01/21/2023)   PRAPARE - Administrator, Civil Service (Medical): No    Lack of Transportation (Non-Medical): No  Physical Activity: Inactive (01/10/2018)   Exercise Vital Sign    Days of Exercise per Week: 0 days    Minutes of Exercise per Session: 0 min  Stress: No Stress Concern Present (01/10/2018)   [employer] of Occupational Health - Occupational Stress Questionnaire    Feeling of Stress : Not at all  Social Connections: Socially Integrated (01/10/2018)   Social Connection and Isolation Panel [NHANES]    Frequency of Communication with Friends and Family: More than three times a week    Frequency of Social Gatherings with Friends and Family: More than three times a week    Attends Religious Services: More than 4 times per year    Active Member of [employer] or Organizations: Yes    Attends Engineer, structural: More than 4 times per year    Marital Status: Married  Catering manager Violence: Not At Risk (01/21/2023)   Humiliation, Afraid, Rape, and Kick questionnaire    Fear of Current or Ex-Partner: No    Emotionally Abused: No    Physically Abused: No    Sexually Abused: No     Family History  Problem Relation Age of Onset   Heart attack Father 44       died first MI at age 37   Heart failure Mother     ROS: Otherwise negative unless mentioned in HPI  Physical Examination  Vitals:   01/17/23 1315 01/17/23 1334  BP: 109/61 127/65   Pulse: (!) 101 (!) 103  Resp: 20 17  Temp:    SpO2: 99% 98%   Body mass index is 27.78 kg/m.    CBC    Component Value Date/Time   WBC 8.2 01/26/2023 0442  RBC 2.49 (L) 01/26/2023 0442   HGB 7.6 (L) 01/26/2023 0442   HGB 12.1 (L) 09/16/2022 1003   HCT 24.1 (L) 01/26/2023 0442   HCT 36.9 (L) 09/16/2022 1003   PLT 192 01/26/2023 0442   PLT 115 (L) 09/16/2022 1003   MCV 96.8 01/26/2023 0442   MCV 96 09/16/2022 1003   MCH 30.5 01/26/2023 0442   MCHC 31.5 01/26/2023 0442   RDW 16.0 (H) 01/26/2023 0442   RDW 13.5 09/16/2022 1003   LYMPHSABS 0.5 (L) 01/21/2023 1400   LYMPHSABS 0.8 09/16/2022 1003   MONOABS 1.4 (H) 01/21/2023 1400   EOSABS 0.2 01/21/2023 1400   EOSABS 0.3 09/16/2022 1003   BASOSABS 0.0 01/21/2023 1400   BASOSABS 0.1 09/16/2022 1003    BMET    Component Value Date/Time   NA 132 (L) 01/26/2023 0442   NA 140 09/16/2022 1003   K 4.4 01/26/2023 0442   CL 97 (L) 01/26/2023 0442   CO2 25 01/26/2023 0442   GLUCOSE 119 (H) 01/26/2023 0442   BUN 54 (H) 01/26/2023 0442   BUN 50 (H) 09/16/2022 1003   CREATININE 4.39 (H) 01/26/2023 0442   CALCIUM 7.9 (L) 01/26/2023 0442   GFRNONAA 12 (L) 01/26/2023 0442   GFRAA 25 (L) 05/26/2019 0503    COAGS: Lab Results  Component Value Date   INR 1.1 11/12/2022   INR 1.1 02/01/2022   INR 1.1 02/11/2021     Non-Invasive Vascular Imaging:   None Ordered at this time.   Statin:   Beta Blocker:   Aspirin:   ACEI:  ARB:   CCB use:   Other antiplatelets/anticoagulants:     ASSESSMENT/PLAN   -   Marcie Bal [facility] 02/04/2023 3:32 PM

## 2023-02-04 NOTE — H&P (Addendum)
History and Physical:    Matthew [name]   WUX:[id number] DOB: July 01, 1930 DOA: 02/04/2023  Referring MD/provider: Cruz Condon, PA and Minna Antis, MD PCP: Sherron Monday, MD   Patient coming from: Home  Chief Complaint: Left arm AV fistula malfunction  History of Present Illness:   Matthew [name] is a 89 y.o. male with medical history significant for CAD s/p CABG and coronary stents, ESRD on hemodialysis, atrial fibrillation, ascending aortic aneurysm, HFrEF, hypertension, hyperlipidemia, type II DM, squamous cell carcinoma of scalp, recent hospitalization (from 01/21/2023 through 01/26/2023) for left arm AV fistula malfunction that was repaired with mechanical thrombectomy on 01/25/2023.  He presented from the outpatient hemodialysis to the emergency department because of left arm AV fistula malfunction.  He could not have dialysis because of clotted left arm AV fistula.  His main complaint is swelling of bilateral lower extremities.  [name], daughter and HPOA, was at the bedside and she said that patient was supposed to have dialysis today and repeat dialysis tomorrow because of extra fluid.  Patient does not report any fever, chills, cough, shortness of breath, chest pain, dizziness, vomiting, abdominal pain or diarrhea.    ROS:   ROS all other systems reviewed were negative  Past Medical History:   Past Medical History:  Diagnosis Date   Anginal pain (HCC)    Arthritis    Ascending aortic aneurysm (HCC) 07/18/2015   a.) TTE 07/18/2015: severe dilation of ascending aorta measuring 7.0 cm. b.) TTE 01/10/2018: aortic root 3.8 cm; ascending aorta 6.8 cm; refused surgical intervention/repair.   Atrial fibrillation (HCC)    a.) CHA2DS2-VASc = 6 (age x 2, HFrEF, HTN, previous MI, T2DM). b.) rate/rhythm maintained on amiodarone + metoprolol succinate; chronic antiplatelet therapy using clopidogrel   Bradycardia    Coronary artery disease    ESRD (end stage renal disease)  (HCC)    GERD (gastroesophageal reflux disease)    HFrEF (heart failure with reduced ejection fraction) (HCC)    a.) TTE 12/03/2013: mod dec LV function; EF 35-40%; severe inferolateral and inferior HK; LA dilated; G3DD; PASP 52 mmHg. b.) TTE 07/18/2015: mild LV dysfunction with mild LVH; EF 45%; mild BAE. c.) TTE 01/10/2018: mod LV dysfunction; EF 45%; diffuse HK   HLD (hyperlipidemia)    Hypertension    IDA (iron deficiency anemia)    Long term current use of antithrombotics/antiplatelets    a.) clopidogrel   Myocardial infarction (HCC) 2000   Pneumonia    S/P CABG x 4 2001   a.) LIMA-LAD, SVG-D1, SVG-OM1, SVG-OM2, SVG-PDA   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   left frontal scalp, EDC   Squamous cell carcinoma of skin 05/05/2020   left temporal scalp, in situ, EDC 05/13/20   T2DM (type 2 diabetes mellitus) (HCC)    Valvular insufficiency 12/03/2013   a.) TTE 12/03/2013: EF 35-40%; mild AR/MR, b.) TTE 07/18/2015: EF 45%; mild AR/PR, mod TR, severe MR. c.) TTE 01/10/2018: mod AR/TR    Past Surgical History:   Past Surgical History:  Procedure Laterality Date   A/V SHUNT INTERVENTION N/A 01/25/2023   Procedure: A/V SHUNT INTERVENTION;  Surgeon: Renford Dills, MD;  Location: [facility];  Service: Cardiovascular;  Laterality: N/A;   A/V SHUNTOGRAM Left 04/01/2022   Procedure: A/V SHUNTOGRAM;  Surgeon: Annice Needy, MD;  Location: [facility];  Service: Cardiovascular;  Laterality: Left;   AV FISTULA PLACEMENT Left 04/15/2021   Procedure: INSERTION OF ARTERIOVENOUS (AV) GORE-TEX GRAFT  ARM ( BRACHIAL AXILLARY);  Surgeon: Annice Needy, MD;  Location: [facility] ORS;  Service: Vascular;  Laterality: Left;   CATARACT EXTRACTION W/PHACO Left 07/26/2017   Procedure: CATARACT EXTRACTION PHACO AND INTRAOCULAR LENS PLACEMENT (IOC);  Surgeon: Galen Manila, MD;  Location: [facility] OR[facility];  Service: Ophthalmology;  Laterality: Left;  [place]   00:45.8 AP%  13.1 CDE  6.00 Fluid Pack  Lot # [id number]   CATARACT EXTRACTION W/PHACO Right 08/17/2017   Procedure: CATARACT EXTRACTION PHACO AND INTRAOCULAR LENS PLACEMENT (IOC);  Surgeon: Galen Manila, MD;  Location: [facility];  Service: Ophthalmology;  Laterality: Right;  [place]  01:30 AP% 16.8 CDE 15.24 Fluid pack lot # [id number]   CHOLECYSTECTOMY     CORONARY ARTERY BYPASS GRAFT N/A 2001   Procedure: 5v CABG (LIMA-LAD, SVG-D1, SVG-OM1, SVG-OM2, SVG-PDA)   DIALYSIS/PERMA CATHETER INSERTION N/A 02/09/2018   Procedure: DIALYSIS/PERMA CATHETER INSERTION;  Surgeon: Annice Needy, MD;  Location: [facility];  Service: Cardiovascular;  Laterality: N/A;   DIALYSIS/PERMA CATHETER REMOVAL N/A 06/25/2021   Procedure: DIALYSIS/PERMA CATHETER REMOVAL;  Surgeon: Annice Needy, MD;  Location: [facility];  Service: Cardiovascular;  Laterality: N/A;   FRACTURE SURGERY Left 2015   LEFT arm   INSERTION OF DIALYSIS CATHETER Right 11/03/2020   Procedure: INSERTION OF Perm Cath in the RIGHT INTERNAL JUGULAR;  Surgeon: Annice Needy, MD;  Location: [facility];  Service: Vascular;  Laterality: Right;   INTRAMEDULLARY (IM) NAIL INTERTROCHANTERIC Right 11/12/2022   Procedure: INTRAMEDULLARY (IM) NAIL INTERTROCHANTERIC;  Surgeon: Juanell Fairly, MD;  Location: [facility];  Service: Orthopedics;  Laterality: Right;   LEFT HEART CATHETERIZATION WITH CORONARY/GRAFT ANGIOGRAM Left 12/04/2013   Procedure: LEFT HEART CATHETERIZATION WITH Isabel Caprice;  Surgeon: Marykay Lex, MD;  [facility];  Service: Cardiovascular;  Laterality: Left;   LEFT HEART CATHETERIZATION WITH CORONARY/GRAFT ANGIOGRAM Left 02/06/2009   Procedure: LEFT HEART CATHETERIZATION WITH CORONARY/GRAFT ANGIOGRAM; Location: [facility]; Surgeon: Despina Hick, MD   PERCUTANEOUS CORONARY STENT INTERVENTION (PCI-S) N/A 12/06/2013   Procedure: STAGED PERCUTANEOUS CORONARY STENT INTERVENTION (overlapping 4.0 x 38 mm (mid) and 4.0 x 28 mm (ostial) Promus Primier DES to  SVG-RPDA graft);  Surgeon: Lesleigh Noe, MD;  Location: [facility] CATH LAB;  Service: Cardiovascular   REMOVAL OF A DIALYSIS CATHETER N/A 11/03/2020   Procedure: REMOVAL OF A DIALYSIS CATHETER;  Surgeon: Annice Needy, MD;  Location: [facility] OR[facility];  Service: Vascular;  Laterality: N/A;   TOTAL HIP REVISION Right 01/12/2023   Procedure: CONVERSION TO  RIGHT TOTAL HIP REPLACEMENT;  Surgeon: Joen Laura, MD;  Location: [facility];  Service: Orthopedics;  Laterality: Right;    Social History:   Social History   Socioeconomic History   Marital status: Widowed    Spouse name: Not on file   Number of children: Not on file   Years of education: Not on file   Highest education level: Not on file  Occupational History   Not on file  Tobacco Use   Smoking status: Former    Current packs/day: 0.00    Types: Pipe, Cigarettes    Quit date: 69    Years since quitting: 32.7   Smokeless tobacco: Never  Vaping Use   Vaping status: Never Used  Substance and Sexual Activity   Alcohol use: Not Currently    Alcohol/week: 1.0 standard drink of alcohol    Types: 1 Cans of beer per week    Comment: occasionally drinking only,once a month  Drug use: No   Sexual activity: Never  Other Topics Concern   Not on file  Social History Narrative   Lives with daughter and son in law   Social Determinants of Health   Financial Resource Strain: Low Risk  (01/10/2018)   Overall Financial Resource Strain (CARDIA)    Difficulty of Paying Living Expenses: Not hard at all  Food Insecurity: No Food Insecurity (01/21/2023)   Hunger Vital Sign    Worried About Running Out of Food in the Last Year: Never true    Ran Out of Food in the Last Year: Never true  Transportation Needs: No Transportation Needs (01/21/2023)   PRAPARE - Administrator, Civil Service (Medical): No    Lack of Transportation (Non-Medical): No  Physical Activity: Inactive (01/10/2018)   Exercise Vital Sign    Days of Exercise per  Week: 0 days    Minutes of Exercise per Session: 0 min  Stress: No Stress Concern Present (01/10/2018)   [employer] of Occupational Health - Occupational Stress Questionnaire    Feeling of Stress : Not at all  Social Connections: Socially Integrated (01/10/2018)   Social Connection and Isolation Panel [NHANES]    Frequency of Communication with Friends and Family: More than three times a week    Frequency of Social Gatherings with Friends and Family: More than three times a week    Attends Religious Services: More than 4 times per year    Active Member of [employer] or Organizations: Yes    Attends Engineer, structural: More than 4 times per year    Marital Status: Married  Catering manager Violence: Not At Risk (01/21/2023)   Humiliation, Afraid, Rape, and Kick questionnaire    Fear of Current or Ex-Partner: No    Emotionally Abused: No    Physically Abused: No    Sexually Abused: No    Allergies   Other and Amoxicillin  Family history:   Family History  Problem Relation Age of Onset   Heart attack Father 29       died first MI at age 34   Heart failure Mother     Current Medications:   Prior to Admission medications   Medication Sig Start Date End Date Taking? Authorizing Provider  cetirizine (ZYRTEC) 10 MG tablet Take 10 mg by mouth daily.    [provider]  cholecalciferol (VITAMIN D) 25 MCG (1000 UNIT) tablet Take 1,000 Units by mouth daily with breakfast.    [provider]  clopidogrel (PLAVIX) 75 MG tablet Take 1 tablet (75 mg total) by mouth daily. 12/02/22   Laurier Nancy, MD  ferrous sulfate 325 (65 FE) MG tablet Take 325 mg by mouth 2 (two) times daily with a meal.     [provider]  fluticasone (FLONASE) 50 MCG/ACT nasal spray Place 2 sprays into both nostrils daily as needed for allergies or rhinitis.    [provider]  levothyroxine (SYNTHROID) 100 MCG tablet Take 100 mcg by mouth daily before breakfast.     [provider]  lidocaine-prilocaine (EMLA) cream Apply 1 Application topically as needed (before dialysis).    [provider]  midodrine (PROAMATINE) 5 MG tablet Take 1 tablet (5 mg total) by mouth 3 (three) times daily with meals. Patient taking differently: Take 5 mg by mouth. 5MG  1 HOUR BEFORE DIALYSIS 02/17/21   Delfino Lovett, MD  multivitamin (RENA-VIT) TABS tablet Take 1 tablet by mouth daily.    [provider]  nitroGLYCERIN (NITROSTAT) 0.4 MG SL tablet Place 0.4 mg under the tongue every 5 (five) minutes as needed for chest pain.    [provider]  Omega-3 Fatty Acids (FISH OIL) 1000 MG CAPS Take 4,000 mg by mouth daily with lunch.    [provider]  polyethylene glycol (MIRALAX / GLYCOLAX) 17 g packet Take 17 g by mouth daily. 01/06/21   Enedina Finner, MD  rosuvastatin (CRESTOR) 40 MG tablet TAKE 1 TABLET BY MOUTH ONCE  DAILY 10/28/22   Laurier Nancy, MD  senna-docusate (SENOKOT-S) 8.6-50 MG tablet Take 2 tablets by mouth 2 (two) times daily. 01/05/21   Enedina Finner, MD    Physical Exam:   Vitals:   02/04/23 1245 02/04/23 1623  BP: 116/63 113/66  Pulse: 78 (!) 115  Resp: 18 18  Temp: 98 F (36.7 C)   SpO2: 100% 95%     Physical Exam: Blood pressure 113/66, pulse (!) 115, temperature 98 F (36.7 C), resp. rate 18, SpO2 95%. Gen: No acute distress. Head: Normocephalic, atraumatic. Eyes: Pupils equal, round and reactive to light. Extraocular movements intact.  Sclerae nonicteric.  Mouth: Moist mucous membranes.  No oropharyngeal exudates or erythema. Neck: Supple, no thyromegaly, no lymphadenopathy, no jugular venous distention. Chest: Air entry adequate bilaterally.  No wheezing.  Bibasilar rales. CV: Heart sounds are regular with an S1, S2. No murmurs, rubs or gallops.  Abdomen: Soft, nontender, nondistended with normal active bowel sounds. No palpable masses. Extremities: Bilateral leg and pedal edema 2+.  No erythema or  tenderness.  No palpable thrill or audible bruit heard on left arm AV fistula. Skin: Warm and dry.  Stage II coccygeal decubitus ulcer Neuro: Alert and oriented times 3; grossly nonfocal.  Psych: Insight is good and judgment is appropriate. Mood and affect normal.   Data Review:    Labs: Basic Metabolic Panel: Recent Labs  Lab 02/04/23 1625  NA 136  K 3.4*  CL 93*  CO2 28  GLUCOSE 134*  BUN 41*  CREATININE 4.26*  CALCIUM 8.6*   Liver Function Tests: Recent Labs  Lab 02/04/23 1625  AST 28  ALT 10  ALKPHOS 263*  BILITOT 0.8  PROT 6.8  ALBUMIN 3.1*   No results for input(s): "LIPASE", "AMYLASE" in the last 168 hours. No results for input(s): "AMMONIA" in the last 168 hours. CBC: Recent Labs  Lab 02/04/23 1625  WBC 7.0  NEUTROABS 4.7  HGB 8.3*  HCT 26.5*  MCV 99.3  PLT 219   Cardiac Enzymes: No results for input(s): "CKTOTAL", "CKMB", "CKMBINDEX", "TROPONINI" in the last 168 hours.  BNP (last 3 results) No results for input(s): "PROBNP" in the last 8760 hours. CBG: No results for input(s): "GLUCAP" in the last 168 hours.  Urinalysis    Component Value Date/Time   COLORURINE YELLOW (A) 05/24/2019 0021   APPEARANCEUR CLEAR (A) 05/24/2019 0021   LABSPEC 1.012 05/24/2019 0021   PHURINE 6.0 05/24/2019 0021   GLUCOSEU NEGATIVE 05/24/2019 0021   HGBUR NEGATIVE 05/24/2019 0021   BILIRUBINUR Negative 10/15/2022 1202   KETONESUR NEGATIVE 05/24/2019 0021   PROTEINUR Positive (A) 10/15/2022 1202   PROTEINUR 30 (A) 05/24/2019 0021   UROBILINOGEN 0.2 10/15/2022 1202   NITRITE Negative 10/15/2022 1202   NITRITE NEGATIVE 05/24/2019 0021   LEUKOCYTESUR Large (3+) (A) 10/15/2022 1202   LEUKOCYTESUR NEGATIVE 05/24/2019 0021      Radiographic Studies: PERIPHERAL VASCULAR CATHETERIZATION  Result Date: 02/04/2023 See surgical note for result.   EKG:  No EKG on chart   Assessment/Plan:   Principal Problem:   Dialysis AV fistula malfunction (HCC) Active  Problems:   CAD (coronary artery disease)   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)   Chronic systolic HF (heart failure) (HCC)   Hypokalemia   Left arm AV fistula function, ESRD: Admit to MedSurg.  Case discussed with Huel Cote, NP from vascular surgery.  Plan for placement of temporary femoral dialysis catheter today.  Dr. Cherylann Ratel, nephrologist, has been consulted for hemodialysis.   CAD s/p CABG and coronary stents: Continue Crestor.  Hold Plavix in anticipation of vascular procedures.   Mild hypokalemia: Will defer treatment to nephrologist.   Anemia of chronic disease: H&H is stable.   History of chronic hypotension: Continue midodrine   Paroxysmal atrial fibrillation: Not on long-term anticoagulation because of high fall risk.  Had been taken off of sotalol in the past because of bradycardia.   Chronic HFrEF: compensated.  2D echo in August 2024 showed EF estimated at 35 to 40%, mild concentric LVH, indeterminate LV diastolic parameters, moderately elevated pulmonary artery systolic pressure, mild to moderate aortic regurgitation.   Other information:   DVT prophylaxis: Heparin  Code Status: DNR.  This was confirmed by patient and [name], daughter, at the bedside Family Communication: Plan discussed with [name], daughter, at the bedside Disposition Plan: Plan to discharge home in 2 to 3 days Consults called: Vascular surgeon, nephrologist Admission status: Inpatient    Kerstyn Coryell [facility] Pager: Please check [email or web address]   How to contact the [facility] Attending or Consulting provider 7A - 7P or covering provider during after hours 7P -7A, for this patient?   Check the care team in [facility] and look for a) attending/consulting TR[facility] provider listed and b) the [facility] team listed Log into [email or web address] and use [place]'s universal password to access. If you do not have the password, please contact the hospital operator. Locate the [facility] provider you are  looking for under [facility] and page to a number that you can be directly reached. If you still have difficulty reaching the provider, please page the [facility] (Director on Call) for the Hospitalists listed on amion for assistance.  02/04/2023, 5:03 PM

## 2023-02-05 DIAGNOSIS — T82590A Other mechanical complication of surgically created arteriovenous fistula, initial encounter: Secondary | ICD-10-CM | POA: Diagnosis not present

## 2023-02-05 LAB — GLUCOSE, CAPILLARY
Glucose-Capillary: 112 mg/dL — ABNORMAL HIGH (ref 70–99)
Glucose-Capillary: 177 mg/dL — ABNORMAL HIGH (ref 70–99)
Glucose-Capillary: 250 mg/dL — ABNORMAL HIGH (ref 70–99)

## 2023-02-05 LAB — RENAL FUNCTION PANEL
Albumin: 2.6 g/dL — ABNORMAL LOW (ref 3.5–5.0)
Anion gap: 13 (ref 5–15)
BUN: 49 mg/dL — ABNORMAL HIGH (ref 8–23)
CO2: 28 mmol/L (ref 22–32)
Calcium: 8.1 mg/dL — ABNORMAL LOW (ref 8.9–10.3)
Chloride: 94 mmol/L — ABNORMAL LOW (ref 98–111)
Creatinine, Ser: 5.2 mg/dL — ABNORMAL HIGH (ref 0.61–1.24)
GFR, Estimated: 10 mL/min — ABNORMAL LOW (ref >60)
Glucose, Bld: 188 mg/dL — ABNORMAL HIGH (ref 70–99)
Phosphorus: 4.7 mg/dL — ABNORMAL HIGH (ref 2.5–4.6)
Potassium: 3.3 mmol/L — ABNORMAL LOW (ref 3.5–5.1)
Sodium: 135 mmol/L (ref 135–145)

## 2023-02-05 LAB — CBC
HCT: 24.4 % — ABNORMAL LOW (ref 39.0–52.0)
Hemoglobin: 7.6 g/dL — ABNORMAL LOW (ref 13.0–17.0)
MCH: 30.3 pg (ref 26.0–34.0)
MCHC: 31.1 g/dL (ref 30.0–36.0)
MCV: 97.2 fL (ref 80.0–100.0)
Platelets: 194 10*3/uL (ref 150–400)
RBC: 2.51 MIL/uL — ABNORMAL LOW (ref 4.22–5.81)
RDW: 16.5 % — ABNORMAL HIGH (ref 11.5–15.5)
WBC: 6.6 10*3/uL (ref 4.0–10.5)
nRBC: 0 % (ref 0.0–0.2)

## 2023-02-05 MED ORDER — ASPIRIN 81 MG PO TBEC
81.0000 mg | DELAYED_RELEASE_TABLET | Freq: Every day | ORAL | Status: DC
  Administered 2023-02-05 – 2023-02-09 (×5): 81 mg via ORAL
  Filled 2023-02-05 (×6): qty 1

## 2023-02-05 MED ORDER — EPOETIN ALFA 10000 UNIT/ML IJ SOLN
10000.0000 [IU] | INTRAMUSCULAR | Status: DC
  Administered 2023-02-07 – 2023-02-09 (×2): 10000 [IU] via INTRAVENOUS
  Filled 2023-02-05 (×2): qty 1

## 2023-02-05 MED ORDER — HEPARIN SODIUM (PORCINE) 1000 UNIT/ML IJ SOLN
3600.0000 [IU] | Freq: Once | INTRAMUSCULAR | Status: AC
  Administered 2023-02-05: 3600 [IU]

## 2023-02-05 MED ORDER — CHLORHEXIDINE GLUCONATE CLOTH 2 % EX PADS
6.0000 | MEDICATED_PAD | Freq: Every day | CUTANEOUS | Status: DC
  Administered 2023-02-05 – 2023-02-09 (×5): 6 via TOPICAL

## 2023-02-05 MED ORDER — EPOETIN ALFA 10000 UNIT/ML IJ SOLN
INTRAMUSCULAR | Status: AC
  Filled 2023-02-05: qty 1

## 2023-02-05 MED ORDER — EPOETIN ALFA 10000 UNIT/ML IJ SOLN
10000.0000 [IU] | Freq: Once | INTRAMUSCULAR | Status: AC
  Administered 2023-02-05: 10000 [IU] via INTRAVENOUS
  Filled 2023-02-05: qty 1

## 2023-02-05 NOTE — Progress Notes (Addendum)
Progress Note    Matthew [name]  ZOX:[id number] DOB: Feb 28, 1931  DOA: 02/04/2023 PCP: Sherron Monday, MD      Brief Narrative:    Medical records reviewed and are as summarized below:  Matthew [name] is a 89 y.o. male  medical history significant for CAD s/p CABG and coronary stents, ESRD on hemodialysis, atrial fibrillation, ascending aortic aneurysm, HFrEF, hypertension, hyperlipidemia, type II DM, squamous cell carcinoma of scalp, recent right total hip replacement on 01/12/2023, recent hospitalization (from 01/21/2023 through 01/26/2023) for left arm AV fistula malfunction that was repaired with mechanical thrombectomy on 01/25/2023.  He presented from the outpatient hemodialysis to the emergency department because of left arm AV fistula malfunction.  He could not have dialysis because of clotted left arm AV fistula.  His main complaint is swelling of bilateral lower extremities.       Assessment/Plan:   Principal Problem:   Dialysis AV fistula malfunction (HCC) Active Problems:   CAD (coronary artery disease)   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)   Chronic systolic HF (heart failure) (HCC)   Hypokalemia   Body mass index is 26.93 kg/m.   Left arm AV fistula function, ESRD: S/p placement of left femoral temporary dialysis cath on 02/04/2023.  Plan for hemodialysis today using temporary dialysis catheter.  Clotted left arm AV fistula repair.  At a later date.  Follow-up with vascular surgeon.   CAD s/p CABG and coronary stents: Continue Crestor.  Hold Plavix in anticipation of vascular procedures.  Use low-dose aspirin for now.     Mild hypokalemia: Will defer treatment to nephrologist.     Anemia of chronic disease: H&H is stable.     History of chronic hypotension: Continue midodrine     Paroxysmal atrial fibrillation: Not on long-term anticoagulation because of high fall risk.  Had been taken off of sotalol in the past because of  bradycardia.     Chronic HFrEF: compensated.  2D echo in August 2024 showed EF estimated at 35 to 40%, mild concentric LVH, indeterminate LV diastolic parameters, moderately elevated pulmonary artery systolic pressure, mild to moderate aortic regurgitation.    Stage II coccygeal and bilateral buttock decubitus ulcers: Present on admission.  Continue local wound care.   Recent right total hip replacement on 01/12/2023: Patient came from rehab facility.  He will need to be reevaluated by PT before he can return for short-term rehab.    Diet Order             Diet renal/carb modified with fluid restriction Diet-HS Snack? Nothing; Fluid restriction: 1200 mL Fluid; Room service appropriate? Yes; Fluid consistency: Thin  Diet effective now                            Consultants: Nephrologist Vascular surgeon  Procedures: Placement of right femoral temporary dialysis catheter on 02/04/2023    Medications:    aspirin EC  81 mg Oral Daily   Chlorhexidine Gluconate Cloth  6 each Topical Q0600   epoetin (EPOGEN/PROCRIT) injection  10,000 Units Intravenous Once   [START ON 02/07/2023] epoetin (EPOGEN/PROCRIT) injection  10,000 Units Intravenous Q M,W,F-HD   heparin  5,000 Units Subcutaneous Q8H   insulin aspart  0-6 Units Subcutaneous TID WC   midodrine  5 mg Oral TID WC   rosuvastatin  40 mg Oral Daily   Continuous Infusions:   Anti-infectives (From admission, onward)  None              Family Communication/Anticipated D/C date and plan/Code Status   DVT prophylaxis: heparin injection 5,000 Units Start: 02/05/23 0600     Code Status: Limited: Do not attempt resuscitation (DNR) -DNR-LIMITED -Do Not Intubate/DNI   Family Communication: None Disposition Plan: Plan to discharge home in 2 to 3 days   Status is: Inpatient Remains inpatient appropriate because: Left arm AV fistula malfunction       Subjective:   Interval events noted.  No  shortness of breath or chest pain.  He feels he still has swelling in the legs.  Objective:    Vitals:   02/04/23 2200 02/05/23 0215 02/05/23 0500 02/05/23 1007  BP:  131/65  (!) 102/58  Pulse:  (!) 102  75  Resp:  20  18  Temp:  98.5 F (36.9 C)  98.2 F (36.8 C)  TempSrc:  Oral  Oral  SpO2:  96%  94%  Weight: 94.1 kg  92.6 kg   Height: 6\' 1"  (1.854 m)      No data found.  No intake or output data in the 24 hours ending 02/05/23 1150 Filed Weights   02/04/23 1902 02/04/23 2200 02/05/23 0500  Weight: 92.1 kg 94.1 kg 92.6 kg    Exam:  GEN: NAD SKIN: Warm and dry EYES: No pallor or icterus ENT: MMM CV: RRR PULM: CTA B ABD: soft, ND, NT, +BS CNS: AAO x 3, non focal EXT: Bilateral leg edema (right leg is bigger than the left and he attributes this to right hip surgery). +  Right femoral dialysis cath         Data Reviewed:   I have personally reviewed following labs and imaging studies:  Labs: Labs show the following:   Basic Metabolic Panel: Recent Labs  Lab 02/04/23 1625  NA 136  K 3.4*  CL 93*  CO2 28  GLUCOSE 134*  BUN 41*  CREATININE 4.26*  CALCIUM 8.6*   GFR Estimated Creatinine Clearance: 13 mL/min (A) (by C-G formula based on SCr of 4.26 mg/dL (H)). Liver Function Tests: Recent Labs  Lab 02/04/23 1625  AST 28  ALT 10  ALKPHOS 263*  BILITOT 0.8  PROT 6.8  ALBUMIN 3.1*   No results for input(s): "LIPASE", "AMYLASE" in the last 168 hours. No results for input(s): "AMMONIA" in the last 168 hours. Coagulation profile No results for input(s): "INR", "PROTIME" in the last 168 hours.  CBC: Recent Labs  Lab 02/04/23 1625  WBC 7.0  NEUTROABS 4.7  HGB 8.3*  HCT 26.5*  MCV 99.3  PLT 219   Cardiac Enzymes: No results for input(s): "CKTOTAL", "CKMB", "CKMBINDEX", "TROPONINI" in the last 168 hours. BNP (last 3 results) No results for input(s): "PROBNP" in the last 8760 hours. CBG: Recent Labs  Lab 02/04/23 2048 02/05/23 0728   GLUCAP 145* 112*   D-Dimer: No results for input(s): "DDIMER" in the last 72 hours. Hgb A1c: No results for input(s): "HGBA1C" in the last 72 hours. Lipid Profile: No results for input(s): "CHOL", "HDL", "LDLCALC", "TRIG", "CHOLHDL", "LDLDIRECT" in the last 72 hours. Thyroid function studies: No results for input(s): "TSH", "T4TOTAL", "T3FREE", "THYROIDAB" in the last 72 hours.  Invalid input(s): "FREET3" Anemia work up: No results for input(s): "VITAMINB12", "FOLATE", "FERRITIN", "TIBC", "IRON", "RETICCTPCT" in the last 72 hours. Sepsis Labs: Recent Labs  Lab 02/04/23 1625  WBC 7.0    Microbiology No results found for this or any previous visit (  from the past 240 hour(s)).  Procedures and diagnostic studies:  PERIPHERAL VASCULAR CATHETERIZATION  Result Date: 02/04/2023 See surgical note for result.              LOS: 1 day   Nedra Mcinnis  [facility]   Pager on [email or web address]. If 7PM-7AM, please contact night-coverage at [email or web address]     02/05/2023, 11:50 AM

## 2023-02-05 NOTE — Progress Notes (Signed)
[facility]  ROUNDING NOTE   Subjective:   [name] was admitted to [facility] on 02/04/2023 for Dialysis AV fistula malfunction (HCC) [T82.590A] Problem with vascular access [Z78.9]  Last hemodialysis treatment was 02/02/23. Patient received full 3:45 at treatment. Post weight of 91.4kg - over 1 kg below his estimated dry weight.   Patient presented on 9/13 to the dialysis center with nonfunctioning AVF.   Patient admitted to [facility] where right femoral temp HD catheter placed by Dr. Gilda Crease.   Objective:  Vital signs in last 24 hours:  Temp:  [98 F (36.7 C)-98.5 F (36.9 C)] 98.2 F (36.8 C) (09/14 1007) Pulse Rate:  [75-115] 75 (09/14 1007) Resp:  [18-20] 18 (09/14 1007) BP: (102-138)/(58-80) 102/58 (09/14 1007) SpO2:  [94 %-100 %] 94 % (09/14 1007) Weight:  [92.1 kg-94.1 kg] 92.6 kg (09/14 0500)  Weight change:  Filed Weights   02/04/23 1902 02/04/23 2200 02/05/23 0500  Weight: 92.1 kg 94.1 kg 92.6 kg    Intake/Output: No intake/output data recorded.   Intake/Output this shift:  No intake/output data recorded.  Physical Exam: General: NAD,   Head: Normocephalic, atraumatic. Moist oral mucosal membranes  Eyes: Anicteric, PERRL  Neck: Supple, trachea midline  Lungs:  Clear to auscultation  Heart: Regular rate and rhythm  Abdomen:  Soft, nontender,   Extremities:  no peripheral edema.  Neurologic: Nonfocal, moving all four extremities  Skin: No lesions  Access: Left AVF with no thrill or bruit, Right femoral temp HD catheter 9/13    Basic Metabolic Panel: Recent Labs  Lab 02/04/23 1625  NA 136  K 3.4*  CL 93*  CO2 28  GLUCOSE 134*  BUN 41*  CREATININE 4.26*  CALCIUM 8.6*    Liver Function Tests: Recent Labs  Lab 02/04/23 1625  AST 28  ALT 10  ALKPHOS 263*  BILITOT 0.8  PROT 6.8  ALBUMIN 3.1*   No results for input(s): "LIPASE", "AMYLASE" in the last 168 hours. No results for input(s): "AMMONIA" in the last 168  hours.  CBC: Recent Labs  Lab 02/04/23 1625  WBC 7.0  NEUTROABS 4.7  HGB 8.3*  HCT 26.5*  MCV 99.3  PLT 219    Cardiac Enzymes: No results for input(s): "CKTOTAL", "CKMB", "CKMBINDEX", "TROPONINI" in the last 168 hours.  BNP: Invalid input(s): "POCBNP"  CBG: Recent Labs  Lab 02/04/23 2048 02/05/23 0728  GLUCAP 145* 112*    Microbiology: Results for orders placed or performed during the hospital encounter of 01/21/23  MRSA Next Gen by PCR, Nasal     Status: None   Collection Time: 01/22/23  6:33 AM   Specimen: Nasal Mucosa; Nasal Swab  Result Value Ref Range Status   MRSA by PCR Next Gen NOT DETECTED NOT DETECTED Final    Comment: (NOTE) The GeneXpert MRSA Assay (FDA approved for NASAL specimens only), is one component of a comprehensive MRSA colonization surveillance program. It is not intended to diagnose MRSA infection nor to guide or monitor treatment for MRSA infections. Test performance is not FDA approved in patients less than 87 years old. Performed at [facility], [address] [place], [place] [address]     Coagulation Studies: No results for input(s): "LABPROT", "INR" in the last 72 hours.  Urinalysis: No results for input(s): "COLORURINE", "LABSPEC", "PHURINE", "GLUCOSEU", "HGBUR", "BILIRUBINUR", "KETONESUR", "PROTEINUR", "UROBILINOGEN", "NITRITE", "LEUKOCYTESUR" in the last 72 hours.  Invalid input(s): "APPERANCEUR"    Imaging: PERIPHERAL VASCULAR CATHETERIZATION  Result Date: 02/04/2023 See surgical  note for result.    Medications:     Chlorhexidine Gluconate Cloth  6 each Topical Q0600   heparin  5,000 Units Subcutaneous Q8H   insulin aspart  0-6 Units Subcutaneous TID WC   midodrine  5 mg Oral TID WC   rosuvastatin  40 mg Oral Daily   acetaminophen **OR** acetaminophen, diphenhydrAMINE, famotidine, fentaNYL (SUBLIMAZE) injection, HYDROmorphone (DILAUDID) injection, lidocaine (PF), methylPREDNISolone (SOLU-MEDROL)  injection, midazolam, ondansetron (ZOFRAN) IV, ondansetron **OR** ondansetron (ZOFRAN) IV, polyethylene glycol  Assessment/ Plan:  [name] is a 89 y.o.  male with end stage renal disease on hemodialysis, hypotension, hyperlipidemia, coronary artery disease status post CABG, atrial fibrillation, ascending aortic aneurysm, congestive hear failure, and diabetes mellitus type II who is admitted to [facility] on 02/04/2023 for Dialysis AV fistula malfunction (HCC) [T82.590A] Problem with vascular access [Z78.9]  [facility] MWF L AVF 92.5kg  End Stage Renal Disease: on hemodialysis. With dialysis device malfunction.  - dialysis today via temp HD catheter - Appreciate vascular surgery input.   Hypotension - midodrine  Anemia with chronic kidney disease: hemoglobin 8.3 - EPO with HD treatments  Secondary Hyperparathyroidism:  - not currently on phos binders.    LOS: 1 Matthew [name] 9/14/202411:11 AM

## 2023-02-05 NOTE — Progress Notes (Signed)
  Received patient in bed to unit.   Informed consent signed and in chart.    TX duration: 3.5 hrs     Transported by  Hand-off given to patient's nurse.    Access used: Femoral R CVC Access issues: None   Total UF removed: 1500 mL Medication(s) given: Epogen Post HD VS: WDL      [facility]

## 2023-02-05 NOTE — TOC Initial Note (Addendum)
Transition of Care [facility]) - Initial/Assessment Note    Patient Details  Name: Matthew [name] MRN: [id number] Date of Birth: [birth year]  [facility]) CM/SW Contact:    Liliana Cline, LCSW Phone Number: 02/05/2023, 11:49 AM  Clinical Narrative:                 Met with patient and daughter [name]) at bedside. Patient is in from [facility] where he was recently discharged to for short term rehab. Patient and daughter state their plan is for patient to return to [facility] to resume STR when medically ready. Asked MD for PT order.   Expected Discharge Plan: Skilled Nursing Facility Barriers to Discharge: Continued Medical Work up   Patient Goals and CMS Choice Patient states their goals for this hospitalization and ongoing recovery are:: wants to go back to [facility] for more rehab CMS Medicare.gov Compare Post Acute Care list provided to:: Patient Choice offered to / list presented to : Patient, Adult Children      Expected Discharge Plan and Services       Living arrangements for the past 2 months: Single Family Home, Skilled Nursing Facility                                      Prior Living Arrangements/Services Living arrangements for the past 2 months: Single Family Home, Skilled Nursing Facility Lives with:: Adult Children Patient language and need for interpreter reviewed:: Yes Do you feel safe going back to the place where you live?: Yes      Need for Family Participation in Patient Care: Yes (Comment) Care giver support system in place?: Yes (comment) Current home services: DME Criminal Activity/Legal Involvement Pertinent to Current Situation/Hospitalization: No - Comment as needed  Activities of Daily Living Home Assistive Devices/Equipment: Dan Humphreys (specify type) ADL Screening (condition at time of admission) Patient's cognitive ability adequate to safely complete daily activities?: Yes Is the patient deaf or have difficulty  hearing?: No Does the patient have difficulty seeing, even when wearing glasses/contacts?: No Does the patient have difficulty concentrating, remembering, or making decisions?: Yes Patient able to express need for assistance with ADLs?: No Does the patient have difficulty dressing or bathing?: Yes Independently performs ADLs?: No Communication: Needs assistance Is this a change from baseline?: Pre-admission baseline Dressing (OT): Needs assistance Is this a change from baseline?: Pre-admission baseline Grooming: Needs assistance Is this a change from baseline?: Pre-admission baseline Feeding: Needs assistance Is this a change from baseline?: Pre-admission baseline Bathing: Needs assistance Is this a change from baseline?: Pre-admission baseline Is this a change from baseline?: Pre-admission baseline In/Out Bed: Needs assistance Is this a change from baseline?: Pre-admission baseline Walks in Home: Needs assistance Is this a change from baseline?: Pre-admission baseline Does the patient have difficulty walking or climbing stairs?: Yes Weakness of Legs: Right Weakness of Arms/Hands: None  Permission Sought/Granted Permission sought to share information with : Facility Medical sales representative, Family Supports Permission granted to share information with : Yes, Verbal Permission Granted  Share Information with NAME: [name] - daughter  Permission granted to share info w AGENCY: Dance movement psychotherapist       Orientation: : Oriented to Self, Oriented to Place, Oriented to  Time, Oriented to Situation Alcohol / Substance Use: Not Applicable Psych Involvement: No (comment)  Admission diagnosis:  Dialysis AV fistula  malfunction (HCC) [T82.590A] Problem with vascular access [Z78.9] Patient Active Problem List   Diagnosis Date Noted   Dialysis AV fistula malfunction (HCC) 02/04/2023   Hypokalemia 02/04/2023   Hemodialysis AV fistula thrombosis (HCC) 01/22/2023    AV fistula occlusion, initial encounter (HCC) 01/21/2023   Dyslipidemia 01/21/2023   Coronary artery disease 01/21/2023   Preop cardiovascular exam 01/12/2023   Closed right hip fracture (HCC) 01/10/2023   Hip fracture (HCC) 01/08/2023   Drop in hemoglobin 11/16/2022   Hyperkalemia 11/15/2022   Closed hip fracture requiring operative repair, right, sequela 11/13/2022   HFrEF (heart failure with reduced ejection fraction) (HCC) 11/12/2022   Transaminitis 11/12/2022   Age-related osteoporosis with current pathological fracture with routine healing 02/18/2021   Pressure injury of skin 02/15/2021   Pelvic fracture (HCC) 02/11/2021   End-stage renal disease on hemodialysis (HCC)    Chronic hypotension 12/26/2020   Tremor    Hypothyroidism    Chronic systolic HF (heart failure) (HCC)    Proximal humerus fracture 12/25/2020   Malposition of peritoneal dialysis catheter (HCC) 11/01/2020   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   Secondary hyperparathyroidism (HCC) 04/30/2018   Sepsis (HCC) 02/15/2018   Cardiomyopathy (HCC) 01/24/2018   Fluid overload 01/10/2018   Climacteric arthritis, lower leg 12/15/2017   Mitral regurgitation 12/15/2017   Hypoglycemia 12/09/2017   Osteoarthritis of knee 06/23/2016   Undiagnosed cardiac murmurs 09/04/2015   Aneurysm of thoracic aorta (HCC) 08/04/2015   Congestive heart failure (HCC) 08/04/2015   Postsurgical aortocoronary bypass status 08/04/2015   Chronic kidney disease 07/18/2015   Aortic root dilatation (HCC) 07/18/2015   Dissecting aortic aneurysm, 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/2015   Edema of both legs 07/18/2015   Shortness of breath 07/18/2015   Atrial fibrillation (HCC) 01/10/2014   Abnormal electrocardiogram 01/10/2014   Encounter for counseling 01/10/2014   Other general symptoms 01/10/2014   CAD (coronary artery disease) 12/04/2013   Unstable angina; Class III Angina 12/03/2013   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)  12/03/2013   HLD (hyperlipidemia) 12/03/2013   Heart disease 12/03/2013   Disorder of kidney and ureter 05/04/2013   Gastrointestinal hemorrhage 05/02/2012   Allergic rhinitis due to pollen 01/06/2007   Reflux esophagitis 01/06/2007   Overweight 10/06/2006   PCP:  Sherron Monday, MD Pharmacy:   [facility] [address], [place] - [address] [place] [address] Phone: [phone] Fax: [phone]  [facility] - [place], [place] - [address] [place] [address] Phone: [phone] Fax: [phone]  [facility] - [place], [place] - [address] [place] [address] Phone: [phone] Fax: [phone]     Social Determinants of Health (SDOH) Social History: SDOH Screenings   Food Insecurity: No Food Insecurity (02/04/2023)  Housing: Patient Declined (02/04/2023)  Transportation Needs: No Transportation Needs (02/04/2023)  Utilities: Not At Risk (02/04/2023)  Depression (PHQ2-9): Low Risk  (09/21/2022)  Financial Resource Strain: Low Risk  (01/10/2018)  Physical Activity: Inactive (01/10/2018)  Social Connections: Socially Integrated (01/10/2018)  Stress: No Stress Concern Present (01/10/2018)  Tobacco Use: Medium Risk (02/04/2023)   SDOH Interventions:     Readmission Risk Interventions    02/05/2023   11:48 AM 01/09/2023    4:42 PM 02/12/2021    3:28 PM  Readmission Risk Prevention Plan  Transportation Screening Complete Complete Complete  PCP or Specialist Appt within 3-5 Days  Complete   HRI or Home Care Consult  Complete   Social Work Consult for Recovery Care Planning/Counseling  Complete   Palliative Care Screening  Not Applicable   Medication Review Oceanographer) Complete Complete Complete  PCP or Specialist appointment within 3-5 days of discharge Complete    HRI or Home Care Consult Complete    SW Recovery Care/Counseling Consult Complete  Complete   Palliative Care Screening Not Applicable    Skilled Nursing Facility Complete  Complete

## 2023-02-06 DIAGNOSIS — T82590A Other mechanical complication of surgically created arteriovenous fistula, initial encounter: Secondary | ICD-10-CM | POA: Diagnosis not present

## 2023-02-06 LAB — GLUCOSE, CAPILLARY
Glucose-Capillary: 146 mg/dL — ABNORMAL HIGH (ref 70–99)
Glucose-Capillary: 187 mg/dL — ABNORMAL HIGH (ref 70–99)
Glucose-Capillary: 174 mg/dL — ABNORMAL HIGH (ref 70–99)

## 2023-02-06 NOTE — [place] FL2 (Signed)
[place] MEDICAID FL2 LEVEL OF CARE FORM     IDENTIFICATION  Patient Name: Matthew [name] Birthdate: [birth year] Sex: male Admission Date (Current Location): 02/04/2023  [facility] and [place] Number:  Chiropodist and Address:  [facility], [address] [place], [place] [address]      Provider Number: [id number]  Attending Physician Name and Address:  Lurene Shadow, MD  Relative Name and Phone Number:  [name] (daughter) [phone]    Current Level of Care: Hospital Recommended Level of Care: Skilled Nursing Facility Prior Approval Number:    Date Approved/Denied:   PASRR Number: [id number]  Discharge Plan: SNF    Current Diagnoses: Patient Active Problem List   Diagnosis Date Noted   Dialysis AV fistula malfunction (HCC) 02/04/2023   Hypokalemia 02/04/2023   Hemodialysis AV fistula thrombosis (HCC) 01/22/2023   AV fistula occlusion, initial encounter (HCC) 01/21/2023   Dyslipidemia 01/21/2023   Coronary artery disease 01/21/2023   Preop cardiovascular exam 01/12/2023   Closed right hip fracture (HCC) 01/10/2023   Hip fracture (HCC) 01/08/2023   Drop in hemoglobin 11/16/2022   Hyperkalemia 11/15/2022   Closed hip fracture requiring operative repair, right, sequela 11/13/2022   HFrEF (heart failure with reduced ejection fraction) (HCC) 11/12/2022   Transaminitis 11/12/2022   Age-related osteoporosis with current pathological fracture with routine healing 02/18/2021   Pressure injury of skin 02/15/2021   Pelvic fracture (HCC) 02/11/2021   End-stage renal disease on hemodialysis (HCC)    Chronic hypotension 12/26/2020   Tremor    Hypothyroidism    Chronic systolic HF (heart failure) (HCC)    Proximal humerus fracture 12/25/2020   Malposition of peritoneal dialysis catheter (HCC) 11/01/2020   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   Secondary hyperparathyroidism (HCC) 04/30/2018   Sepsis (HCC) 02/15/2018   Cardiomyopathy  (HCC) 01/24/2018   Fluid overload 01/10/2018   Climacteric arthritis, lower leg 12/15/2017   Mitral regurgitation 12/15/2017   Hypoglycemia 12/09/2017   Osteoarthritis of knee 06/23/2016   Undiagnosed cardiac murmurs 09/04/2015   Aneurysm of thoracic aorta (HCC) 08/04/2015   Congestive heart failure (HCC) 08/04/2015   Postsurgical aortocoronary bypass status 08/04/2015   Chronic kidney disease 07/18/2015   Aortic root dilatation (HCC) 07/18/2015   Dissecting aortic aneurysm, 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/2015   Edema of both legs 07/18/2015   Shortness of breath 07/18/2015   Atrial fibrillation (HCC) 01/10/2014   Abnormal electrocardiogram 01/10/2014   Encounter for counseling 01/10/2014   Other general symptoms 01/10/2014   CAD (coronary artery disease) 12/04/2013   Unstable angina; Class III Angina 12/03/2013   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) 12/03/2013   HLD (hyperlipidemia) 12/03/2013   Heart disease 12/03/2013   Disorder of kidney and ureter 05/04/2013   Gastrointestinal hemorrhage 05/02/2012   Allergic rhinitis due to pollen 01/06/2007   Reflux esophagitis 01/06/2007   Overweight 10/06/2006    Orientation RESPIRATION BLADDER Height & Weight     Self, Time, Situation, Place  Normal Incontinent, External catheter Weight:  (UTA) Height:  6\' 1"  (185.4 cm)  BEHAVIORAL SYMPTOMS/MOOD NEUROLOGICAL BOWEL NUTRITION STATUS      Continent Diet (see discharge summary)  AMBULATORY STATUS COMMUNICATION OF NEEDS Skin   Extensive Assist Verbally Other (Comment) (pressure injury buttox stage 2, pressure injury coccyx stage 2,)                       Personal Care Assistance Level of Assistance  Bathing, Feeding,  Total care, Dressing Bathing Assistance: Maximum assistance Feeding assistance: Limited assistance Dressing Assistance: Maximum assistance Total Care Assistance: Maximum assistance   Functional Limitations Info  Sight, Hearing, Speech Sight Info:  Adequate Hearing Info: Impaired Speech Info: Adequate    SPECIAL CARE FACTORS FREQUENCY  PT (By licensed PT), OT (By licensed OT)     PT Frequency: min 4x weekly OT Frequency: min 4x weekly            Contractures Contractures Info: Not present    Additional Factors Info  Code Status, Allergies Code Status Info: Limited DNR Allergies Info: statin, drugs, amoxicillin           Current Medications (02/06/2023):  This is the current hospital active medication list Current Facility-Administered Medications  Medication Dose Route Frequency Provider Last Rate Last Admin   acetaminophen (TYLENOL) tablet 650 mg  650 mg Oral Q6H PRN Lurene Shadow, MD   650 mg at 02/06/23 0205   Or   acetaminophen (TYLENOL) suppository 650 mg  650 mg Rectal Q6H PRN Lurene Shadow, MD       aspirin EC tablet 81 mg  81 mg Oral Daily Lurene Shadow, MD   81 mg at 02/06/23 0347   Chlorhexidine Gluconate Cloth 2 % PADS 6 each  6 each Topical Q0600 Lurene Shadow, MD   6 each at 02/06/23 0918   diphenhydrAMINE (BENADRYL) injection 50 mg  50 mg Intravenous Once PRN Marcie Bal, NP       [START ON 02/07/2023] epoetin alfa (EPOGEN) injection 10,000 Units  10,000 Units Intravenous Q M,W,F-HD Kolluru, Sarath, MD       famotidine (PEPCID) tablet 40 mg  40 mg Oral Once PRN Pace, Brien R, NP       fentaNYL (SUBLIMAZE) injection 12.5 mcg  12.5 mcg Intravenous Once PRN Rolla Plate R, NP       heparin injection 5,000 Units  5,000 Units Subcutaneous Q8H Lurene Shadow, MD   5,000 Units at 02/06/23 0553   HYDROmorphone (DILAUDID) injection 1 mg  1 mg Intravenous Once PRN Pace, Brien R, NP       insulin aspart (novoLOG) injection 0-6 Units  0-6 Units Subcutaneous TID WC Lurene Shadow, MD   1 Units at 02/05/23 1213   lidocaine (PF) (XYLOCAINE) 1 % injection    PRN Schnier, Latina Craver, MD   5 mL at 02/04/23 1700   methylPREDNISolone sodium succinate (SOLU-MEDROL) 125 mg/2 mL injection 125 mg  125 mg Intravenous Once PRN  Pace, Brien R, NP       midazolam (VERSED) 2 MG/ML syrup 8 mg  8 mg Oral Once PRN Rolla Plate R, NP       midodrine (PROAMATINE) tablet 5 mg  5 mg Oral TID WC Lurene Shadow, MD   5 mg at 02/06/23 0918   ondansetron (ZOFRAN) injection 4 mg  4 mg Intravenous Q6H PRN Pace, Brien R, NP       ondansetron (ZOFRAN) tablet 4 mg  4 mg Oral Q6H PRN Lurene Shadow, MD       Or   ondansetron (ZOFRAN) injection 4 mg  4 mg Intravenous Q6H PRN Lurene Shadow, MD       polyethylene glycol (MIRALAX / GLYCOLAX) packet 17 g  17 g Oral Daily PRN Lurene Shadow, MD       rosuvastatin (CRESTOR) tablet 40 mg  40 mg Oral Daily Lurene Shadow, MD   40 mg at 02/06/23 4259     Discharge Medications: Please see discharge  summary for a list of discharge medications.  Relevant Imaging Results:  Relevant Lab Results:   Additional Information SSN: [id number] HD [facility] MWF 11:!5 am  Darolyn Rua, LCSW

## 2023-02-06 NOTE — Evaluation (Signed)
Physical Therapy Evaluation Patient Details Name: Matthew [name] MRN: [id number] DOB: 06-13-1930 Today's Date: 02/07/2023  History of Present Illness  Matthew [name] is a 89 y.o. male with medical history significant for CAD s/p CABG and coronary stents, ESRD on hemodialysis, atrial fibrillation, ascending aortic aneurysm, HFrEF, hypertension, hyperlipidemia, type II DM, squamous cell carcinoma of scalp, recent hospitalization (from 01/21/2023 through 01/26/2023) for left arm AV fistula malfunction that was repaired with mechanical thrombectomy on 01/25/2023.  He presented from the outpatient hemodialysis to the emergency department because of left arm AV fistula malfunction.  He could not have dialysis because of clotted left arm AV fistula.  His main complaint is swelling of bilateral lower extremities.    Clinical Impression  Pt admitted with above diagnosis. Pt currently with functional limitations due to the deficits listed below (see PT Problem List). Pt received upright in bed agreeable to PT. Reports PTA being at [employer] ambulating short household distances with RW. No clear WB orders or status but 2-3 weeks ago pt was 50% PWB on RLE. Reviewed post hip precautions with pt too prior to mobility.  To date, pt does states last he heard he was 50% WB but able to ambulate these distances at rehab. Currently pt supervision with increased time and bed features to transfer from supine to sitting at R side of bed. Pt reliant on bed elevated and VC's for hand placement to stand at [facility]. Educated pt to demo how he has been walking at [employer] once standing. Pt clearly placing > 50% on RLE. Maybe upwards to WBAT on RLE completing ~64' of gait declining pain. Post gait pt minA at RLE to return to supine in bed and bridging and rolling R/L mod _ with bed rails for donut placement on sacrum and to elevate upwards in bed. Post session daughter called. Daughter stating last ortho f/u was 02/01/23 but was unclear on  whether there was new WB status updates. Educated that pt is ambulating well however if he is truly 50% WB on RLE then we need to continue to follow that to promote safe healing.  Anticipate pt would greatly be limited in mobility if he truly performs 50% WB. Pt remains weak and needing some safety cues to perform mobility. Pt will benefit from continued PT services to address functional mobility impairments prior to return home.         If plan is discharge home, recommend the following: Two people to help with walking and/or transfers;Help with stairs or ramp for entrance;Assist for transportation;Assistance with cooking/housework   Can travel by private vehicle   No    Equipment Recommendations Other (comment) (TBD by next venue of care)  Recommendations for Other Services       Functional Status Assessment Patient has had a recent decline in their functional status and demonstrates the ability to make significant improvements in function in a reasonable and predictable amount of time.     Precautions / Restrictions        Mobility  Bed Mobility                    Transfers                        Ambulation/Gait                  Stairs            Wheelchair Mobility  Tilt Bed    Modified Rankin (Stroke Patients Only)       Balance                                             Pertinent Vitals/Pain      Home Living                          Prior Function                       Extremity/Trunk Assessment                Communication      Cognition                                                General Comments      Exercises     Assessment/Plan    PT Assessment Patient needs continued PT services  PT Problem List Decreased strength;Decreased range of motion;Decreased activity tolerance;Decreased balance;Decreased mobility;Decreased knowledge of  precautions;Decreased knowledge of use of DME       PT Treatment Interventions DME instruction;Neuromuscular re-education;Gait training;Stair training;Patient/family education;Functional mobility training;Therapeutic activities;Therapeutic exercise;Balance training    PT Goals (Current goals can be found in the Care Plan section)       Frequency Min 1X/week     Co-evaluation               AM-PAC PT "6 Clicks" Mobility  Outcome Measure Help needed turning from your back to your side while in a flat bed without using bedrails?: A Lot Help needed moving from lying on your back to sitting on the side of a flat bed without using bedrails?: A Lot Help needed moving to and from a bed to a chair (including a wheelchair)?: A Lot Help needed standing up from a chair using your arms (e.g., wheelchair or bedside chair)?: A Lot Help needed to walk in hospital room?: A Lot Help needed climbing 3-5 steps with a railing? : Total 6 Click Score: 11    End of Session Equipment Utilized During Treatment: Gait belt Activity Tolerance: Patient tolerated treatment well Patient left: in bed;with call bell/phone within reach;with bed alarm set;Other (comment) Nurse Communication: Mobility status;Weight bearing status PT Visit Diagnosis: Other abnormalities of gait and mobility (R26.89);Difficulty in walking, not elsewhere classified (R26.2);Pain;Muscle weakness (generalized) (M62.81) Pain - Right/Left: Right Pain - part of body: Hip    Time:  -      Charges:                 Delphia Grates. Fairly IV, PT, DPT Physical Therapist- [place]  [facility]  02/07/2023, 11:58 AM

## 2023-02-06 NOTE — Progress Notes (Addendum)
Progress Note    Matthew [name]  BJY:[id number] DOB: Nov 02, 1930  DOA: 02/04/2023 PCP: Sherron Monday, MD      Brief Narrative:    Medical records reviewed and are as summarized below:  Matthew [name] is a 89 y.o. male  medical history significant for CAD s/p CABG and coronary stents, ESRD on hemodialysis, atrial fibrillation, ascending aortic aneurysm, HFrEF, hypertension, hyperlipidemia, type II DM, squamous cell carcinoma of scalp, recent right total hip replacement on 01/12/2023, recent hospitalization (from 01/21/2023 through 01/26/2023) for left arm AV fistula malfunction that was repaired with mechanical thrombectomy on 01/25/2023.  He presented from the outpatient hemodialysis to the emergency department because of left arm AV fistula malfunction.  He could not have dialysis because of clotted left arm AV fistula.  His main complaint is swelling of bilateral lower extremities.       Assessment/Plan:   Principal Problem:   Dialysis AV fistula malfunction (HCC) Active Problems:   CAD (coronary artery disease)   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)   Chronic systolic HF (heart failure) (HCC)   Hypokalemia   Body mass index is 26.93 kg/m.   Left arm AV fistula function, ESRD: S/p placement of left femoral temporary dialysis cath on 02/04/2023.  Plan for hemodialysis tomorrow.  Clotted left arm AV fistula repair to be repaired by vascular surgeon.  Follow-up with nephrologist and vascular surgeon.Marland Kitchen   CAD s/p CABG and coronary stents: Continue Crestor.  Hold Plavix in anticipation of vascular procedures.  Continue low-dose aspirin while Plavix is on hold.     Mild hypokalemia: Will defer treatment to nephrologist.     Anemia of chronic disease: H&H is stable.  Erythropoietin with hemodialysis.     History of chronic hypotension: Continue midodrine     Paroxysmal atrial fibrillation: Not on long-term anticoagulation because of high fall risk.   Had been taken off of sotalol in the past because of bradycardia.     Chronic HFrEF: compensated.  2D echo in August 2024 showed EF estimated at 35 to 40%, mild concentric LVH, indeterminate LV diastolic parameters, moderately elevated pulmonary artery systolic pressure, mild to moderate aortic regurgitation.    Stage II coccygeal and bilateral buttock decubitus ulcers: Present on admission.  Continue local wound care.   Recent right total hip replacement on 01/12/2023: Patient came from rehab facility.  PT recommended discharge back to SNF.   Diet Order             Diet NPO time specified  Diet effective midnight           Diet renal/carb modified with fluid restriction Diet-HS Snack? Nothing; Fluid restriction: 1200 mL Fluid; Room service appropriate? Yes; Fluid consistency: Thin  Diet effective now                            Consultants: Nephrologist Vascular surgeon  Procedures: Placement of right femoral temporary dialysis catheter on 02/04/2023    Medications:    aspirin EC  81 mg Oral Daily   Chlorhexidine Gluconate Cloth  6 each Topical Q0600   [START ON 02/07/2023] epoetin (EPOGEN/PROCRIT) injection  10,000 Units Intravenous Q M,W,F-HD   heparin  5,000 Units Subcutaneous Q8H   insulin aspart  0-6 Units Subcutaneous TID WC   midodrine  5 mg Oral TID WC   rosuvastatin  40 mg Oral Daily   Continuous Infusions:   Anti-infectives (  From admission, onward)    None              Family Communication/Anticipated D/C date and plan/Code Status   DVT prophylaxis: heparin injection 5,000 Units Start: 02/05/23 0600     Code Status: Limited: Do not attempt resuscitation (DNR) -DNR-LIMITED -Do Not Intubate/DNI   Family Communication: None Disposition Plan: Plan to discharge home in 2 to 3 days   Status is: Inpatient Remains inpatient appropriate because: Left arm AV fistula malfunction       Subjective:   Interval events noted.  He has  no complaints.  He feels better.  He was sitting up on the edge of the bed.  Physical therapist was at the bedside.  Objective:    Vitals:   02/05/23 1921 02/06/23 0430 02/06/23 0500 02/06/23 0810  BP: 113/66 (!) 83/52 112/73 107/61  Pulse: 91 91 78 96  Resp: 16 16  20   Temp: 98.6 F (37 C) 98.4 F (36.9 C)  98.3 F (36.8 C)  TempSrc: Oral Oral  Oral  SpO2: 97% 97%  98%  Weight:      Height:       No data found.   Intake/Output Summary (Last 24 hours) at 02/06/2023 1510 Last data filed at 02/06/2023 1040 Gross per 24 hour  Intake 240 ml  Output 1500 ml  Net -1260 ml   Filed Weights   02/04/23 1902 02/04/23 2200 02/05/23 0500  Weight: 92.1 kg 94.1 kg 92.6 kg    Exam:   GEN: NAD SKIN: Warm and dry EYES: No pallor or icterus ENT: MMM CV: RRR PULM: CTA B ABD: soft, ND, NT, +BS CNS: AAO x 3, non focal EXT: Right lower extremity edema (1+).  Left leg edema is resolved.      Data Reviewed:   I have personally reviewed following labs and imaging studies:  Labs: Labs show the following:   Basic Metabolic Panel: Recent Labs  Lab 02/04/23 1625 02/05/23 1545  NA 136 135  K 3.4* 3.3*  CL 93* 94*  CO2 28 28  GLUCOSE 134* 188*  BUN 41* 49*  CREATININE 4.26* 5.20*  CALCIUM 8.6* 8.1*  PHOS  --  4.7*   GFR Estimated Creatinine Clearance: 10.7 mL/min (A) (by C-G formula based on SCr of 5.2 mg/dL (H)). Liver Function Tests: Recent Labs  Lab 02/04/23 1625 02/05/23 1545  AST 28  --   ALT 10  --   ALKPHOS 263*  --   BILITOT 0.8  --   PROT 6.8  --   ALBUMIN 3.1* 2.6*   No results for input(s): "LIPASE", "AMYLASE" in the last 168 hours. No results for input(s): "AMMONIA" in the last 168 hours. Coagulation profile No results for input(s): "INR", "PROTIME" in the last 168 hours.  CBC: Recent Labs  Lab 02/04/23 1625 02/05/23 1545  WBC 7.0 6.6  NEUTROABS 4.7  --   HGB 8.3* 7.6*  HCT 26.5* 24.4*  MCV 99.3 97.2  PLT 219 194   Cardiac Enzymes: No  results for input(s): "CKTOTAL", "CKMB", "CKMBINDEX", "TROPONINI" in the last 168 hours. BNP (last 3 results) No results for input(s): "PROBNP" in the last 8760 hours. CBG: Recent Labs  Lab 02/04/23 2048 02/05/23 0728 02/05/23 1208 02/05/23 2210 02/06/23 1259  GLUCAP 145* 112* 177* 250* 146*   D-Dimer: No results for input(s): "DDIMER" in the last 72 hours. Hgb A1c: No results for input(s): "HGBA1C" in the last 72 hours. Lipid Profile: No results for input(s): "CHOL", "HDL", "  LDLCALC", "TRIG", "CHOLHDL", "LDLDIRECT" in the last 72 hours. Thyroid function studies: No results for input(s): "TSH", "T4TOTAL", "T3FREE", "THYROIDAB" in the last 72 hours.  Invalid input(s): "FREET3" Anemia work up: No results for input(s): "VITAMINB12", "FOLATE", "FERRITIN", "TIBC", "IRON", "RETICCTPCT" in the last 72 hours. Sepsis Labs: Recent Labs  Lab 02/04/23 1625 02/05/23 1545  WBC 7.0 6.6    Microbiology No results found for this or any previous visit (from the past 240 hour(s)).  Procedures and diagnostic studies:  PERIPHERAL VASCULAR CATHETERIZATION  Result Date: 02/04/2023 See surgical note for result.              LOS: 2 days   Rihan Schueler  [facility]   Pager on [email or web address]. If 7PM-7AM, please contact night-coverage at [email or web address]     02/06/2023, 3:10 PM

## 2023-02-06 NOTE — Progress Notes (Signed)
[facility]  ROUNDING NOTE   Subjective:   [name] was admitted to [facility] on 02/04/2023 for Dialysis AV fistula malfunction (HCC) [T82.590A] Problem with vascular access [Z78.9]  Hemodialysis treatment yesterday with right femoral temp HD catheter. Tolerated treatment well. UF of .   Patient today with no complaints.   Spoke over the phone to patient's daughter, Matthew [name].   Objective:  Vital signs in last 24 hours:  Temp:  [98.3 F (36.8 C)-98.7 F (37.1 C)] 98.3 F (36.8 C) (09/15 0810) Pulse Rate:  [76-113] 96 (09/15 0810) Resp:  [16-25] 20 (09/15 0810) BP: (83-129)/(48-73) 107/61 (09/15 0810) SpO2:  [97 %-100 %] 98 % (09/15 0810)  Weight change:  Filed Weights   02/04/23 1902 02/04/23 2200 02/05/23 0500  Weight: 92.1 kg 94.1 kg 92.6 kg    Intake/Output: I/O last 3 completed shifts: In: 240 [P.O.:240] Out: 1500 [Other:1500]   Intake/Output this shift:  Total I/O In: 240 [P.O.:240] Out: -   Physical Exam: General: NAD, laying in bed   Head: Normocephalic, atraumatic. Moist oral mucosal membranes  Eyes: Anicteric, PERRL  Neck: Supple, trachea midline  Lungs:  Clear to auscultation  Heart: Regular rate and rhythm  Abdomen:  Soft, nontender,   Extremities:  no peripheral edema.  Neurologic: Nonfocal, moving all four extremities  Skin: No lesions  Access: Left AVF with no thrill or bruit, Right femoral temp HD catheter 9/13    Basic Metabolic Panel: Recent Labs  Lab 02/04/23 1625 02/05/23 1545  NA 136 135  K 3.4* 3.3*  CL 93* 94*  CO2 28 28  GLUCOSE 134* 188*  BUN 41* 49*  CREATININE 4.26* 5.20*  CALCIUM 8.6* 8.1*  PHOS  --  4.7*    Liver Function Tests: Recent Labs  Lab 02/04/23 1625 02/05/23 1545  AST 28  --   ALT 10  --   ALKPHOS 263*  --   BILITOT 0.8  --   PROT 6.8  --   ALBUMIN 3.1* 2.6*   No results for input(s): "LIPASE", "AMYLASE" in the last 168 hours. No results for input(s): "AMMONIA" in the last  168 hours.  CBC: Recent Labs  Lab 02/04/23 1625 02/05/23 1545  WBC 7.0 6.6  NEUTROABS 4.7  --   HGB 8.3* 7.6*  HCT 26.5* 24.4*  MCV 99.3 97.2  PLT 219 194    Cardiac Enzymes: No results for input(s): "CKTOTAL", "CKMB", "CKMBINDEX", "TROPONINI" in the last 168 hours.  BNP: Invalid input(s): "POCBNP"  CBG: Recent Labs  Lab 02/04/23 2048 02/05/23 0728 02/05/23 1208 02/05/23 2210  GLUCAP 145* 112* 177* 250*    Microbiology: Results for orders placed or performed during the hospital encounter of 01/21/23  MRSA Next Gen by PCR, Nasal     Status: None   Collection Time: 01/22/23  6:33 AM   Specimen: Nasal Mucosa; Nasal Swab  Result Value Ref Range Status   MRSA by PCR Next Gen NOT DETECTED NOT DETECTED Final    Comment: (NOTE) The GeneXpert MRSA Assay (FDA approved for NASAL specimens only), is one component of a comprehensive MRSA colonization surveillance program. It is not intended to diagnose MRSA infection nor to guide or monitor treatment for MRSA infections. Test performance is not FDA approved in patients less than 4 years old. Performed at [facility], [address] [place], [place] [address]     Coagulation Studies: No results for input(s): "LABPROT", "INR" in the last 72 hours.  Urinalysis: No results for  input(s): "COLORURINE", "LABSPEC", "PHURINE", "GLUCOSEU", "HGBUR", "BILIRUBINUR", "KETONESUR", "PROTEINUR", "UROBILINOGEN", "NITRITE", "LEUKOCYTESUR" in the last 72 hours.  Invalid input(s): "APPERANCEUR"    Imaging: PERIPHERAL VASCULAR CATHETERIZATION  Result Date: 02/04/2023 See surgical note for result.    Medications:     aspirin EC  81 mg Oral Daily   Chlorhexidine Gluconate Cloth  6 each Topical Q0600   [START ON 02/07/2023] epoetin (EPOGEN/PROCRIT) injection  10,000 Units Intravenous Q M,W,F-HD   heparin  5,000 Units Subcutaneous Q8H   insulin aspart  0-6 Units Subcutaneous TID WC   midodrine  5 mg Oral TID WC    rosuvastatin  40 mg Oral Daily   acetaminophen **OR** acetaminophen, diphenhydrAMINE, famotidine, fentaNYL (SUBLIMAZE) injection, HYDROmorphone (DILAUDID) injection, lidocaine (PF), methylPREDNISolone (SOLU-MEDROL) injection, midazolam, ondansetron (ZOFRAN) IV, ondansetron **OR** ondansetron (ZOFRAN) IV, polyethylene glycol  Assessment/ Plan:  [name] is a 89 y.o.  male with end stage renal disease on hemodialysis, hypotension, hyperlipidemia, coronary artery disease status post CABG, atrial fibrillation, ascending aortic aneurysm, congestive hear failure, and diabetes mellitus type II who is admitted to [facility] on 02/04/2023 for Dialysis AV fistula malfunction (HCC) [T82.590A] Problem with vascular access [Z78.9]  [facility] MWF L AVF 92.5kg  End Stage Renal Disease: on hemodialysis. With dialysis device malfunction.  - Resume MWF schedule, dialysis for tomorrow.  - Appreciate vascular surgery input.   Hypotension - midodrine  Anemia with chronic kidney disease:  - EPO with MWF HD treatments  Secondary Hyperparathyroidism:  - not currently on phos binders.    LOS: 2 Song Myre 9/15/202410:52 AM

## 2023-02-07 ENCOUNTER — Encounter: Payer: Self-pay | Admitting: Vascular Surgery

## 2023-02-07 DIAGNOSIS — T82590A Other mechanical complication of surgically created arteriovenous fistula, initial encounter: Secondary | ICD-10-CM | POA: Diagnosis not present

## 2023-02-07 LAB — GLUCOSE, CAPILLARY
Glucose-Capillary: 103 mg/dL — ABNORMAL HIGH (ref 70–99)
Glucose-Capillary: 97 mg/dL (ref 70–99)
Glucose-Capillary: 99 mg/dL (ref 70–99)
Glucose-Capillary: 199 mg/dL — ABNORMAL HIGH (ref 70–99)

## 2023-02-07 LAB — CBC
HCT: 23.8 % — ABNORMAL LOW (ref 39.0–52.0)
Hemoglobin: 7.6 g/dL — ABNORMAL LOW (ref 13.0–17.0)
MCH: 30.5 pg (ref 26.0–34.0)
MCHC: 31.9 g/dL (ref 30.0–36.0)
MCV: 95.6 fL (ref 80.0–100.0)
Platelets: 184 10*3/uL (ref 150–400)
RBC: 2.49 MIL/uL — ABNORMAL LOW (ref 4.22–5.81)
RDW: 16.3 % — ABNORMAL HIGH (ref 11.5–15.5)
WBC: 6.3 10*3/uL (ref 4.0–10.5)
nRBC: 0 % (ref 0.0–0.2)

## 2023-02-07 LAB — RENAL FUNCTION PANEL
Albumin: 2.6 g/dL — ABNORMAL LOW (ref 3.5–5.0)
Anion gap: 10 (ref 5–15)
BUN: 34 mg/dL — ABNORMAL HIGH (ref 8–23)
CO2: 28 mmol/L (ref 22–32)
Calcium: 8.1 mg/dL — ABNORMAL LOW (ref 8.9–10.3)
Chloride: 97 mmol/L — ABNORMAL LOW (ref 98–111)
Creatinine, Ser: 3.91 mg/dL — ABNORMAL HIGH (ref 0.61–1.24)
GFR, Estimated: 14 mL/min — ABNORMAL LOW (ref >60)
Glucose, Bld: 105 mg/dL — ABNORMAL HIGH (ref 70–99)
Phosphorus: 3.2 mg/dL (ref 2.5–4.6)
Potassium: 3.3 mmol/L — ABNORMAL LOW (ref 3.5–5.1)
Sodium: 135 mmol/L (ref 135–145)

## 2023-02-07 MED ORDER — MELATONIN 5 MG PO TABS
5.0000 mg | ORAL_TABLET | Freq: Every day | ORAL | Status: DC
  Administered 2023-02-07 – 2023-02-09 (×3): 5 mg via ORAL
  Filled 2023-02-07 (×3): qty 1

## 2023-02-07 MED ORDER — HEPARIN SODIUM (PORCINE) 1000 UNIT/ML IJ SOLN
INTRAMUSCULAR | Status: AC
  Filled 2023-02-07: qty 10

## 2023-02-07 MED ORDER — EPOETIN ALFA 10000 UNIT/ML IJ SOLN
INTRAMUSCULAR | Status: AC
  Filled 2023-02-07: qty 1

## 2023-02-07 NOTE — TOC Progression Note (Deleted)
Transition of Care [facility]) - Progression Note    Patient Details  Name: RIZWAN MAMMONE MRN: [id number] Date of Birth: 04/06/1931  Transition of Care [facility]) CM/SW Contact  Chapman Fitch, RN Phone Number: 02/07/2023, 2:02 PM  Clinical Narrative:    Insurance auth approved through 9/18 Provided Auth information to [employer] coordinating change of HD center per days/time/location request from [place]   Expected Discharge Plan: Skilled Nursing Facility Barriers to Discharge: Continued Medical Work up  Expected Discharge Plan and Services       Living arrangements for the past 2 months: Single Family Home, Skilled Nursing Facility                                       Social Determinants of Health (SDOH) Interventions SDOH Screenings   Food Insecurity: No Food Insecurity (02/04/2023)  Housing: Patient Declined (02/04/2023)  Transportation Needs: No Transportation Needs (02/04/2023)  Utilities: Not At Risk (02/04/2023)  Depression (PHQ2-9): Low Risk  (09/21/2022)  Financial Resource Strain: Low Risk  (01/10/2018)  Physical Activity: Inactive (01/10/2018)  Social Connections: Socially Integrated (01/10/2018)  Stress: No Stress Concern Present (01/10/2018)  Tobacco Use: Medium Risk (02/04/2023)    Readmission Risk Interventions    02/05/2023   11:48 AM 01/09/2023    4:42 PM 02/12/2021    3:28 PM  Readmission Risk Prevention Plan  Transportation Screening Complete Complete Complete  PCP or Specialist Appt within 3-5 Days  Complete   HRI or Home Care Consult  Complete   Social Work Consult for Recovery Care Planning/Counseling  Complete   Palliative Care Screening  Not Applicable   Medication Review [employer]ographer) Complete Complete Complete  PCP or Specialist appointment within 3-5 days of discharge Complete    HRI or Home Care Consult Complete    SW Recovery Care/Counseling Consult Complete  Complete  Palliative Care Screening Not Applicable    Skilled  Nursing Facility Complete  Complete

## 2023-02-07 NOTE — Care Management Important Message (Signed)
Important Message  Patient Details  Name: Matthew [name] MRN: [id number] Date of Birth: 1931/01/19   Medicare Important Message Given:  Yes  Patient out of room upon time of visit, no family in room.  Copy of Medicare IM left in room for reference.   Johnell Comings 02/07/2023, 10:10 AM

## 2023-02-07 NOTE — Progress Notes (Signed)
[facility]  ROUNDING NOTE   Subjective:   [name] was admitted to [facility] on 02/04/2023 for Dialysis AV fistula malfunction (HCC) [T82.590A] Problem with vascular access [Z78.9]  Patient seen and evaluated during dialysis   HEMODIALYSIS FLOWSHEET:  Blood Flow Rate (mL/min): 400 mL/min Arterial Pressure (mmHg): -190 mmHg Venous Pressure (mmHg): 170 mmHg TMP (mmHg): 2 mmHg Ultrafiltration Rate (mL/min): 686 mL/min Dialysate Flow Rate (mL/min): 300 ml/min Dialysis Fluid Bolus: Normal Saline  Tolerating treatment well through HD temp cath  NPO for fistulogram later today.  Objective:  Vital signs in last 24 hours:  Temp:  [98.1 F (36.7 C)-98.8 F (37.1 C)] 98.4 F (36.9 C) (09/16 0926) Pulse Rate:  [69-87] 69 (09/16 0926) Resp:  [15-16] 15 (09/16 0926) BP: (114-137)/(54-67) 122/57 (09/16 1008) SpO2:  [95 %-98 %] 97 % (09/16 0926)  Weight change:  Filed Weights   02/05/23 0500  Weight: 92.6 kg    Intake/Output: I/O last 3 completed shifts: In: 480 [P.O.:480] Out: 450 [Urine:450]   Intake/Output this shift:  No intake/output data recorded.  Physical Exam: General: NAD, laying in bed   Head: Normocephalic, atraumatic. Moist oral mucosal membranes  Eyes: Anicteric  Neck: Supple  Lungs:  Clear to auscultation  Heart: Regular rate and rhythm  Abdomen:  Soft, nontender,   Extremities:  no peripheral edema.  Neurologic: Nonfocal, moving all four extremities  Skin: No lesions  Access: Left AVF with no thrill or bruit, Right femoral temp HD catheter 9/13    Basic Metabolic Panel: Recent Labs  Lab 02/04/23 1625 02/05/23 1545 02/07/23 0956  NA 136 135 135  K 3.4* 3.3* 3.3*  CL 93* 94* 97*  CO2 28 28 28   GLUCOSE 134* 188* 105*  BUN 41* 49* 34*  CREATININE 4.26* 5.20* 3.91*  CALCIUM 8.6* 8.1* 8.1*  PHOS  --  4.7* 3.2    Liver Function Tests: Recent Labs  Lab 02/04/23 1625 02/05/23 1545 02/07/23 0956  AST 28  --   --   ALT 10   --   --   ALKPHOS 263*  --   --   BILITOT 0.8  --   --   PROT 6.8  --   --   ALBUMIN 3.1* 2.6* 2.6*   No results for input(s): "LIPASE", "AMYLASE" in the last 168 hours. No results for input(s): "AMMONIA" in the last 168 hours.  CBC: Recent Labs  Lab 02/04/23 1625 02/05/23 1545 02/07/23 0956  WBC 7.0 6.6 6.3  NEUTROABS 4.7  --   --   HGB 8.3* 7.6* 7.6*  HCT 26.5* 24.4* 23.8*  MCV 99.3 97.2 95.6  PLT 219 194 184    Cardiac Enzymes: No results for input(s): "CKTOTAL", "CKMB", "CKMBINDEX", "TROPONINI" in the last 168 hours.  BNP: Invalid input(s): "POCBNP"  CBG: Recent Labs  Lab 02/05/23 2210 02/06/23 1259 02/06/23 1651 02/06/23 2122 02/07/23 0823  GLUCAP 250* 146* 187* 174* 103*    Microbiology: Results for orders placed or performed during the hospital encounter of 01/21/23  MRSA Next Gen by PCR, Nasal     Status: None   Collection Time: 01/22/23  6:33 AM   Specimen: Nasal Mucosa; Nasal Swab  Result Value Ref Range Status   MRSA by PCR Next Gen NOT DETECTED NOT DETECTED Final    Comment: (NOTE) The GeneXpert MRSA Assay (FDA approved for NASAL specimens only), is one component of a comprehensive MRSA colonization surveillance program. It is not intended to diagnose MRSA infection nor  to guide or monitor treatment for MRSA infections. Test performance is not FDA approved in patients less than 11 years old. Performed at [facility], [address] [place], [place] [address]     Coagulation Studies: No results for input(s): "LABPROT", "INR" in the last 72 hours.  Urinalysis: No results for input(s): "COLORURINE", "LABSPEC", "PHURINE", "GLUCOSEU", "HGBUR", "BILIRUBINUR", "KETONESUR", "PROTEINUR", "UROBILINOGEN", "NITRITE", "LEUKOCYTESUR" in the last 72 hours.  Invalid input(s): "APPERANCEUR"    Imaging: No results found.   Medications:     aspirin EC  81 mg Oral Daily   Chlorhexidine Gluconate Cloth  6 each Topical Q0600   epoetin  (EPOGEN/PROCRIT) injection  10,000 Units Intravenous Q M,W,F-HD   heparin  5,000 Units Subcutaneous Q8H   insulin aspart  0-6 Units Subcutaneous TID WC   midodrine  5 mg Oral TID WC   rosuvastatin  40 mg Oral Daily   acetaminophen **OR** acetaminophen, diphenhydrAMINE, famotidine, fentaNYL (SUBLIMAZE) injection, HYDROmorphone (DILAUDID) injection, lidocaine (PF), methylPREDNISolone (SOLU-MEDROL) injection, midazolam, ondansetron (ZOFRAN) IV, ondansetron **OR** ondansetron (ZOFRAN) IV, polyethylene glycol  Assessment/ Plan:  [name] is a 89 y.o.  male with end stage renal disease on hemodialysis, hypotension, hyperlipidemia, coronary artery disease status post CABG, atrial fibrillation, ascending aortic aneurysm, congestive hear failure, and diabetes mellitus type II who is admitted to [facility] on 02/04/2023 for Dialysis AV fistula malfunction (HCC) [T82.590A] Problem with vascular access [Z78.9]  [facility] MWF L AVF 92.5kg  End Stage Renal Disease: on hemodialysis. With dialysis device malfunction.  - Resume MWF schedule - Appreciate vascular surgery performing fistulogram later today  Hypotension - Continue midodrine  Anemia with chronic kidney disease:   - Hgb 7.6 - EPO with MWF HD treatments  Secondary Hyperparathyroidism:  - not currently on phos binders.  - Calcium and phosphorus within desired range   LOS: 3   9/16/202410:49 AM

## 2023-02-07 NOTE — Progress Notes (Signed)
  Received patient in bed to unit.   Informed consent signed and in chart.    TX duration: 3.5hrs     Transported back to floor  Hand-off given to patient's nurse. No c/o and no distress noted    Access used: R Femoral HD catheter Access issues: none   Total UF removed: 1.5L Medication(s) given:  10,000u epo Post HD VS: 122/83 Post HD weight: UTO     Lynann Beaver  [facility]

## 2023-02-07 NOTE — Progress Notes (Signed)
Progress Note    02/07/2023 12:37 PM 3 Days Post-Op  Subjective:  Matthew [name] is a 89 yo male who presented to [facility] emergency department last Friday for a non functioning Left Upper Extremity A/V graft. Patient had a temporary dialysis catheter placed last Friday and has been dialyzed over the weekend. No complaints overnight and vitals all remain stable. Vascular Surgery plans on taking the patient to the vascular lab later today for a left upper extremity A/V graft fistulagram with possible intervention and possible Perma Catheter placement.     Vitals:   02/07/23 1130 02/07/23 1200  BP: 136/86 124/83  Pulse: 100 69  Resp: 17 18  Temp:    SpO2: 99% 99%   Physical Exam: Cardiac:  RRR, Normal S1, S2,  No Murmurs present.  Lungs:  Clear on auscultation throughout, No rales, Rhonchi or wheezing Incisions: Right Groin with temporary dialysis catheter.  Extremities:  Bilateral lower extremities with +2 edema.  Abdomen:  Positive bowel sounds throughout, soft non tender and non distended.  Neurologic: AAOX1 name only. Hx of dementia.   CBC    Component Value Date/Time   WBC 6.3 02/07/2023 0956   RBC 2.49 (L) 02/07/2023 0956   HGB 7.6 (L) 02/07/2023 0956   HGB 12.1 (L) 09/16/2022 1003   HCT 23.8 (L) 02/07/2023 0956   HCT 36.9 (L) 09/16/2022 1003   PLT 184 02/07/2023 0956   PLT 115 (L) 09/16/2022 1003   MCV 95.6 02/07/2023 0956   MCV 96 09/16/2022 1003   MCH 30.5 02/07/2023 0956   MCHC 31.9 02/07/2023 0956   RDW 16.3 (H) 02/07/2023 0956   RDW 13.5 09/16/2022 1003   LYMPHSABS 0.7 02/04/2023 1625   LYMPHSABS 0.8 09/16/2022 1003   MONOABS 0.9 02/04/2023 1625   EOSABS 0.6 (H) 02/04/2023 1625   EOSABS 0.3 09/16/2022 1003   BASOSABS 0.1 02/04/2023 1625   BASOSABS 0.1 09/16/2022 1003    BMET    Component Value Date/Time   NA 135 02/07/2023 0956   NA 140 09/16/2022 1003   K 3.3 (L) 02/07/2023 0956   CL 97 (L) 02/07/2023 0956   CO2 28 02/07/2023 0956   GLUCOSE 105  (H) 02/07/2023 0956   BUN 34 (H) 02/07/2023 0956   BUN 50 (H) 09/16/2022 1003   CREATININE 3.91 (H) 02/07/2023 0956   CALCIUM 8.1 (L) 02/07/2023 0956   GFRNONAA 14 (L) 02/07/2023 0956   GFRAA 25 (L) 05/26/2019 0503    INR    Component Value Date/Time   INR 1.1 11/12/2022 0618     Intake/Output Summary (Last 24 hours) at 02/07/2023 1237 Last data filed at 02/07/2023 0334 Gross per 24 hour  Intake 240 ml  Output 450 ml  Net -210 ml     Assessment/Plan:  89 y.o. male is admitted for placement of temporary dialysis catheter due to non functioning left upper extremity A/V Graft.  3 Days Post-Op   PLAN: Vascular Surgery plans on taking the patient to the vascular lab for a left upper extremity AV/ fistulogram with possible intervention and possible dialysis permacatheter placement.  I have discussed the plan in detail with the patient and family member on Friday, 02/04/2023 while the patient was in the emergency department being checked again.  Family member verbalized her understanding and agrees with proceeding with the plan.  I had answered all of her questions on Friday.  Patient was made n.p.o. after midnight.  We will proceed as scheduled.  Patient's family member signed the  consent for the patient due to a history of dementia.  I discussed the plan with Dr. Festus Barren MD and he agrees with the plan.  Marcie Bal [facility] 02/07/2023 12:37 PM

## 2023-02-07 NOTE — Plan of Care (Signed)
  Problem: Education: Goal: Verbalization of understanding the information provided (i.e., activity precautions, restrictions, etc) will improve Outcome: Progressing Goal: Individualized Educational Video(s) Outcome: Progressing   Problem: Activity: Goal: Ability to ambulate and perform ADLs will improve Outcome: Progressing   Problem: Clinical Measurements: Goal: Postoperative complications will be avoided or minimized Outcome: Progressing   Problem: Self-Concept: Goal: Ability to maintain and perform role responsibilities to the fullest extent possible will improve Outcome: Progressing   Problem: Pain Management: Goal: Pain level will decrease Outcome: Progressing   Problem: Education: Goal: Knowledge of General Education information will improve Description: Including pain rating scale, medication(s)/side effects and non-pharmacologic comfort measures Outcome: Progressing   Problem: Health Behavior/Discharge Planning: Goal: Ability to manage health-related needs will improve Outcome: Progressing   Problem: Clinical Measurements: Goal: Ability to maintain clinical measurements within normal limits will improve Outcome: Progressing Goal: Will remain free from infection Outcome: Progressing Goal: Diagnostic test results will improve Outcome: Progressing Goal: Respiratory complications will improve Outcome: Progressing Goal: Cardiovascular complication will be avoided Outcome: Progressing   Problem: Activity: Goal: Risk for activity intolerance will decrease Outcome: Progressing   Problem: Nutrition: Goal: Adequate nutrition will be maintained Outcome: Progressing   Problem: Coping: Goal: Level of anxiety will decrease Outcome: Progressing   Problem: Elimination: Goal: Will not experience complications related to bowel motility Outcome: Progressing Goal: Will not experience complications related to urinary retention Outcome: Progressing   Problem: Pain  Managment: Goal: General experience of comfort will improve Outcome: Progressing   Problem: Safety: Goal: Ability to remain free from injury will improve Outcome: Progressing   Problem: Skin Integrity: Goal: Risk for impaired skin integrity will decrease Outcome: Progressing   Problem: Education: Goal: Ability to describe self-care measures that may prevent or decrease complications (Diabetes Survival Skills Education) will improve Outcome: Progressing Goal: Individualized Educational Video(s) Outcome: Progressing   Problem: Coping: Goal: Ability to adjust to condition or change in health will improve Outcome: Progressing   Problem: Fluid Volume: Goal: Ability to maintain a balanced intake and output will improve Outcome: Progressing   Problem: Health Behavior/Discharge Planning: Goal: Ability to identify and utilize available resources and services will improve Outcome: Progressing Goal: Ability to manage health-related needs will improve Outcome: Progressing   Problem: Metabolic: Goal: Ability to maintain appropriate glucose levels will improve Outcome: Progressing   Problem: Nutritional: Goal: Maintenance of adequate nutrition will improve Outcome: Progressing Goal: Progress toward achieving an optimal weight will improve Outcome: Progressing   Problem: Skin Integrity: Goal: Risk for impaired skin integrity will decrease Outcome: Progressing   Problem: Tissue Perfusion: Goal: Adequacy of tissue perfusion will improve Outcome: Progressing

## 2023-02-07 NOTE — H&P (View-Only) (Signed)
Progress Note    02/07/2023 12:37 PM 3 Days Post-Op  Subjective:  Matthew [name] is a 89 yo male who presented to [facility] emergency department last Friday for a non functioning Left Upper Extremity A/V graft. Patient had a temporary dialysis catheter placed last Friday and has been dialyzed over the weekend. No complaints overnight and vitals all remain stable. Vascular Surgery plans on taking the patient to the vascular lab later today for a left upper extremity A/V graft fistulagram with possible intervention and possible Perma Catheter placement.     Vitals:   02/07/23 1130 02/07/23 1200  BP: 136/86 124/83  Pulse: 100 69  Resp: 17 18  Temp:    SpO2: 99% 99%   Physical Exam: Cardiac:  RRR, Normal S1, S2,  No Murmurs present.  Lungs:  Clear on auscultation throughout, No rales, Rhonchi or wheezing Incisions: Right Groin with temporary dialysis catheter.  Extremities:  Bilateral lower extremities with +2 edema.  Abdomen:  Positive bowel sounds throughout, soft non tender and non distended.  Neurologic: AAOX1 name only. Hx of dementia.   CBC    Component Value Date/Time   WBC 6.3 02/07/2023 0956   RBC 2.49 (L) 02/07/2023 0956   HGB 7.6 (L) 02/07/2023 0956   HGB 12.1 (L) 09/16/2022 1003   HCT 23.8 (L) 02/07/2023 0956   HCT 36.9 (L) 09/16/2022 1003   PLT 184 02/07/2023 0956   PLT 115 (L) 09/16/2022 1003   MCV 95.6 02/07/2023 0956   MCV 96 09/16/2022 1003   MCH 30.5 02/07/2023 0956   MCHC 31.9 02/07/2023 0956   RDW 16.3 (H) 02/07/2023 0956   RDW 13.5 09/16/2022 1003   LYMPHSABS 0.7 02/04/2023 1625   LYMPHSABS 0.8 09/16/2022 1003   MONOABS 0.9 02/04/2023 1625   EOSABS 0.6 (H) 02/04/2023 1625   EOSABS 0.3 09/16/2022 1003   BASOSABS 0.1 02/04/2023 1625   BASOSABS 0.1 09/16/2022 1003    BMET    Component Value Date/Time   NA 135 02/07/2023 0956   NA 140 09/16/2022 1003   K 3.3 (L) 02/07/2023 0956   CL 97 (L) 02/07/2023 0956   CO2 28 02/07/2023 0956   GLUCOSE 105  (H) 02/07/2023 0956   BUN 34 (H) 02/07/2023 0956   BUN 50 (H) 09/16/2022 1003   CREATININE 3.91 (H) 02/07/2023 0956   CALCIUM 8.1 (L) 02/07/2023 0956   GFRNONAA 14 (L) 02/07/2023 0956   GFRAA 25 (L) 05/26/2019 0503    INR    Component Value Date/Time   INR 1.1 11/12/2022 0618     Intake/Output Summary (Last 24 hours) at 02/07/2023 1237 Last data filed at 02/07/2023 0334 Gross per 24 hour  Intake 240 ml  Output 450 ml  Net -210 ml     Assessment/Plan:  89 y.o. male is admitted for placement of temporary dialysis catheter due to non functioning left upper extremity A/V Graft.  3 Days Post-Op   PLAN: Vascular Surgery plans on taking the patient to the vascular lab for a left upper extremity AV/ fistulogram with possible intervention and possible dialysis permacatheter placement.  I have discussed the plan in detail with the patient and family member on Friday, 02/04/2023 while the patient was in the emergency department being checked again.  Family member verbalized her understanding and agrees with proceeding with the plan.  I had answered all of her questions on Friday.  Patient was made n.p.o. after midnight.  We will proceed as scheduled.  Patient's family member signed the  consent for the patient due to a history of dementia.  I discussed the plan with Dr. Festus Barren MD and he agrees with the plan.  Marcie Bal [facility] 02/07/2023 12:37 PM

## 2023-02-07 NOTE — TOC Progression Note (Addendum)
Transition of Care [facility]) - Progression Note    Patient Details  Name: Matthew [name] MRN: [id number] Date of Birth: August 17, 1930  Transition of Care [facility]) CM/SW Contact  Chapman Fitch, RN Phone Number: 02/07/2023, 2:10 PM  Clinical Narrative:    Fl2 sent to [facility] in [place].  Message sent to Tiffany at [facility] to confirm patient admitted from their facility   Update: 232pm Tiffany at [facility] confirms patient was admitted to hospital from [facility]  Expected Discharge Plan: Skilled Nursing Facility Barriers to Discharge: Continued Medical Work up  Expected Discharge Plan and Services       Living arrangements for the past 2 months: Single Family Home, Skilled Nursing Facility                                       Social Determinants of Health (SDOH) Interventions SDOH Screenings   Food Insecurity: No Food Insecurity (02/04/2023)  Housing: Patient Declined (02/04/2023)  Transportation Needs: No Transportation Needs (02/04/2023)  Utilities: Not At Risk (02/04/2023)  Depression (PHQ2-9): Low Risk  (09/21/2022)  Financial Resource Strain: Low Risk  (01/10/2018)  Physical Activity: Inactive (01/10/2018)  Social Connections: Socially Integrated (01/10/2018)  Stress: No Stress Concern Present (01/10/2018)  Tobacco Use: Medium Risk (02/04/2023)    Readmission Risk Interventions    02/05/2023   11:48 AM 01/09/2023    4:42 PM 02/12/2021    3:28 PM  Readmission Risk Prevention Plan  Transportation Screening Complete Complete Complete  PCP or Specialist Appt within 3-5 Days  Complete   HRI or Home Care Consult  Complete   Social Work Consult for Recovery Care Planning/Counseling  Complete   Palliative Care Screening  Not Applicable   Medication Review Oceanographer) Complete Complete Complete  PCP or Specialist appointment within 3-5 days of discharge Complete    HRI or Home Care Consult Complete    SW Recovery Care/Counseling Consult Complete  Complete   Palliative Care Screening Not Applicable    Skilled Nursing Facility Complete  Complete

## 2023-02-07 NOTE — Plan of Care (Signed)
  Problem: Pain Management: Goal: Pain level will decrease Outcome: Progressing   Problem: Clinical Measurements: Goal: Cardiovascular complication will be avoided Outcome: Progressing   Problem: Nutrition: Goal: Adequate nutrition will be maintained Outcome: Progressing   Problem: Elimination: Goal: Will not experience complications related to urinary retention Outcome: Progressing

## 2023-02-07 NOTE — Progress Notes (Addendum)
Progress Note    [name]  DGL:[id number] DOB: [birth year]  DOA: 02/04/2023 PCP: Sherron Monday, MD      Brief Narrative:    Medical records reviewed and are as summarized below:  [name] is a 89 y.o. male  medical history significant for CAD s/p CABG and coronary stents, ESRD on hemodialysis, atrial fibrillation, ascending aortic aneurysm, HFrEF, hypertension, hyperlipidemia, type II DM, squamous cell carcinoma of scalp, recent right total hip replacement on 01/12/2023, recent hospitalization (from 01/21/2023 through 01/26/2023) for left arm AV fistula malfunction that was repaired with mechanical thrombectomy on 01/25/2023.  He presented from the outpatient hemodialysis to the emergency department because of left arm AV fistula malfunction.  He could not have dialysis because of clotted left arm AV fistula.  His main complaint is swelling of bilateral lower extremities.       Assessment/Plan:   Principal Problem:   Dialysis AV fistula malfunction (HCC) Active Problems:   CAD (coronary artery disease)   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)   Chronic systolic HF (heart failure) (HCC)   Hypokalemia   Body mass index is 26.93 kg/m.   Left arm AV fistula function, ESRD: S/p placement of left femoral temporary dialysis cath on 02/04/2023.  Plan for hemodialysis tomorrow.  Clotted left arm AV fistula to be repaired by vascular surgeon today.  Follow-up with nephrologist and vascular surgeon.Marland Kitchen   CAD s/p CABG and coronary stents: Continue Crestor.  Hold Plavix in anticipation of vascular procedures.  Continue low-dose aspirin while Plavix is on hold.     Mild hypokalemia: Will defer treatment to nephrologist.     Anemia of chronic disease: H&H is stable.  Erythropoietin with hemodialysis.     History of chronic hypotension: Continue midodrine     Paroxysmal atrial fibrillation: Not on long-term anticoagulation because of high fall risk.  Had  been taken off of sotalol in the past because of bradycardia.     Chronic HFrEF: compensated.  2D echo in August 2024 showed EF estimated at 35 to 40%, mild concentric LVH, indeterminate LV diastolic parameters, moderately elevated pulmonary artery systolic pressure, mild to moderate aortic regurgitation.    Stage II coccygeal and bilateral buttock decubitus ulcers: Present on admission.  Continue local wound care.   Recent right total hip replacement on 01/12/2023: Patient came from rehab facility.  PT recommended discharge back to SNF.   Diet Order             Diet NPO time specified  Diet effective midnight                            Consultants: Nephrologist Vascular surgeon  Procedures: Placement of right femoral temporary dialysis catheter on 02/04/2023    Medications:    aspirin EC  81 mg Oral Daily   Chlorhexidine Gluconate Cloth  6 each Topical Q0600   epoetin (EPOGEN/PROCRIT) injection  10,000 Units Intravenous Q M,W,F-HD   heparin  5,000 Units Subcutaneous Q8H   insulin aspart  0-6 Units Subcutaneous TID WC   midodrine  5 mg Oral TID WC   rosuvastatin  40 mg Oral Daily   Continuous Infusions:   Anti-infectives (From admission, onward)    None              Family Communication/Anticipated D/C date and plan/Code Status   DVT prophylaxis: heparin injection 5,000 Units Start: 02/05/23 0600  Code Status: Limited: Do not attempt resuscitation (DNR) -DNR-LIMITED -Do Not Intubate/DNI   Family Communication: None Disposition Plan: Plan to discharge SNF tomorrow   Status is: Inpatient Remains inpatient appropriate because: Left arm AV fistula malfunction       Subjective:   Interval events noted.  Patient was seen at the hemodialysis unit.  He complains of fatigue and feeling sleepy because he did not sleep well last night.  No other complaints.  No shortness of breath or chest pain.  Objective:    Vitals:   02/07/23  1100 02/07/23 1130 02/07/23 1200 02/07/23 1230  BP: 130/81 136/86 124/83 129/82  Pulse: 85 100 69 91  Resp: 15 17 18 18   Temp:      TempSrc:      SpO2: 99% 99% 99% 100%  Weight:      Height:       No data found.   Intake/Output Summary (Last 24 hours) at 02/07/2023 1321 Last data filed at 02/07/2023 0334 Gross per 24 hour  Intake 240 ml  Output 450 ml  Net -210 ml   Filed Weights   02/05/23 0500  Weight: 92.6 kg    Exam:  GEN: NAD SKIN: Warm and dry EYES: No pallor or icterus ENT: MMM CV: RRR PULM: CTA B ABD: soft, ND, NT, +BS CNS: AAO x 3, non focal EXT: Right lower extremity edema(1+)      Data Reviewed:   I have personally reviewed following labs and imaging studies:  Labs: Labs show the following:   Basic Metabolic Panel: Recent Labs  Lab 02/04/23 1625 02/05/23 1545 02/07/23 0956  NA 136 135 135  K 3.4* 3.3* 3.3*  CL 93* 94* 97*  CO2 28 28 28   GLUCOSE 134* 188* 105*  BUN 41* 49* 34*  CREATININE 4.26* 5.20* 3.91*  CALCIUM 8.6* 8.1* 8.1*  PHOS  --  4.7* 3.2   GFR Estimated Creatinine Clearance: 14.2 mL/min (A) (by C-G formula based on SCr of 3.91 mg/dL (H)). Liver Function Tests: Recent Labs  Lab 02/04/23 1625 02/05/23 1545 02/07/23 0956  AST 28  --   --   ALT 10  --   --   ALKPHOS 263*  --   --   BILITOT 0.8  --   --   PROT 6.8  --   --   ALBUMIN 3.1* 2.6* 2.6*   No results for input(s): "LIPASE", "AMYLASE" in the last 168 hours. No results for input(s): "AMMONIA" in the last 168 hours. Coagulation profile No results for input(s): "INR", "PROTIME" in the last 168 hours.  CBC: Recent Labs  Lab 02/04/23 1625 02/05/23 1545 02/07/23 0956  WBC 7.0 6.6 6.3  NEUTROABS 4.7  --   --   HGB 8.3* 7.6* 7.6*  HCT 26.5* 24.4* 23.8*  MCV 99.3 97.2 95.6  PLT 219 194 184   Cardiac Enzymes: No results for input(s): "CKTOTAL", "CKMB", "CKMBINDEX", "TROPONINI" in the last 168 hours. BNP (last 3 results) No results for input(s): "PROBNP"  in the last 8760 hours. CBG: Recent Labs  Lab 02/05/23 2210 02/06/23 1259 02/06/23 1651 02/06/23 2122 02/07/23 0823  GLUCAP 250* 146* 187* 174* 103*   D-Dimer: No results for input(s): "DDIMER" in the last 72 hours. Hgb A1c: No results for input(s): "HGBA1C" in the last 72 hours. Lipid Profile: No results for input(s): "CHOL", "HDL", "LDLCALC", "TRIG", "CHOLHDL", "LDLDIRECT" in the last 72 hours. Thyroid function studies: No results for input(s): "TSH", "T4TOTAL", "T3FREE", "THYROIDAB" in the last 72  hours.  Invalid input(s): "FREET3" Anemia work up: No results for input(s): "VITAMINB12", "FOLATE", "FERRITIN", "TIBC", "IRON", "RETICCTPCT" in the last 72 hours. Sepsis Labs: Recent Labs  Lab 02/04/23 1625 02/05/23 1545 02/07/23 0956  WBC 7.0 6.6 6.3    Microbiology No results found for this or any previous visit (from the past 240 hour(s)).  Procedures and diagnostic studies:  No results found.             LOS: 3 days   Jeweline Reif  [facility]   Pager on [email or web address]. If 7PM-7AM, please contact night-coverage at [email or web address]     02/07/2023, 1:21 PM

## 2023-02-08 ENCOUNTER — Encounter
Admission: EM | Disposition: A | Discharge status: Skilled Nursing Facility | Payer: Self-pay | Source: Home / Self Care | Attending: Internal Medicine

## 2023-02-08 ENCOUNTER — Ambulatory Visit: Payer: Medicare Other | Admitting: Dermatology

## 2023-02-08 DIAGNOSIS — T82590A Other mechanical complication of surgically created arteriovenous fistula, initial encounter: Secondary | ICD-10-CM | POA: Diagnosis not present

## 2023-02-08 LAB — GLUCOSE, CAPILLARY
Glucose-Capillary: 97 mg/dL (ref 70–99)
Glucose-Capillary: 99 mg/dL (ref 70–99)
Glucose-Capillary: 111 mg/dL — ABNORMAL HIGH (ref 70–99)
Glucose-Capillary: 103 mg/dL — ABNORMAL HIGH (ref 70–99)
Glucose-Capillary: 168 mg/dL — ABNORMAL HIGH (ref 70–99)

## 2023-02-08 SURGERY — A/V FISTULAGRAM
Anesthesia: Moderate Sedation | Laterality: Left

## 2023-02-08 MED ORDER — DIPHENHYDRAMINE HCL 50 MG/ML IJ SOLN: 50.0000 mg | Freq: Once | INTRAMUSCULAR | Status: DC | PRN

## 2023-02-08 MED ORDER — ONDANSETRON HCL 4 MG/2ML IJ SOLN: 4.0000 mg | Freq: Four times a day (QID) | INTRAMUSCULAR | Status: DC | PRN

## 2023-02-08 MED ORDER — ALPRAZOLAM 0.5 MG PO TABS
0.5000 mg | ORAL_TABLET | Freq: Every evening | ORAL | Status: DC | PRN
  Administered 2023-02-09 (×2): 0.5 mg via ORAL
  Filled 2023-02-08 (×2): qty 1

## 2023-02-08 MED ORDER — METHYLPREDNISOLONE SODIUM SUCC 125 MG IJ SOLR: 125.0000 mg | Freq: Once | INTRAMUSCULAR | Status: DC | PRN

## 2023-02-08 MED ORDER — HYDROMORPHONE HCL 1 MG/ML IJ SOLN: 1.0000 mg | Freq: Once | INTRAMUSCULAR | Status: DC | PRN

## 2023-02-08 MED ORDER — CEFAZOLIN SODIUM-DEXTROSE 1-4 GM/50ML-% IV SOLN
INTRAVENOUS | Status: AC
  Filled 2023-02-08: qty 50

## 2023-02-08 MED ORDER — MIDAZOLAM HCL 2 MG/ML PO SYRP: 8.0000 mg | ORAL_SOLUTION | Freq: Once | ORAL | Status: DC | PRN

## 2023-02-08 MED ORDER — FENTANYL CITRATE PF 50 MCG/ML IJ SOSY: 12.5000 ug | PREFILLED_SYRINGE | Freq: Once | INTRAMUSCULAR | Status: DC | PRN

## 2023-02-08 MED ORDER — CEFAZOLIN SODIUM-DEXTROSE 1-4 GM/50ML-% IV SOLN
1.0000 g | INTRAVENOUS | Status: DC
  Filled 2023-02-08: qty 50

## 2023-02-08 MED ORDER — ALPRAZOLAM 0.25 MG PO TABS
0.2500 mg | ORAL_TABLET | Freq: Once | ORAL | Status: AC
  Administered 2023-02-08: 0.25 mg via ORAL
  Filled 2023-02-08: qty 1

## 2023-02-08 MED ORDER — SODIUM CHLORIDE 0.9 % IV SOLN: INTRAVENOUS | Status: DC

## 2023-02-08 MED ORDER — FAMOTIDINE 20 MG PO TABS: 40.0000 mg | ORAL_TABLET | Freq: Once | ORAL | Status: DC | PRN

## 2023-02-08 NOTE — Plan of Care (Signed)
  Problem: Education: Goal: Verbalization of understanding the information provided (i.e., activity precautions, restrictions, etc) will improve Outcome: Progressing Goal: Individualized Educational Video(s) Outcome: Progressing   Problem: Activity: Goal: Ability to ambulate and perform ADLs will improve Outcome: Progressing   Problem: Clinical Measurements: Goal: Postoperative complications will be avoided or minimized Outcome: Progressing   Problem: Self-Concept: Goal: Ability to maintain and perform role responsibilities to the fullest extent possible will improve Outcome: Progressing   Problem: Pain Management: Goal: Pain level will decrease Outcome: Progressing   Problem: Education: Goal: Knowledge of General Education information will improve Description: Including pain rating scale, medication(s)/side effects and non-pharmacologic comfort measures Outcome: Progressing   Problem: Health Behavior/Discharge Planning: Goal: Ability to manage health-related needs will improve Outcome: Progressing   Problem: Clinical Measurements: Goal: Ability to maintain clinical measurements within normal limits will improve Outcome: Progressing Goal: Will remain free from infection Outcome: Progressing Goal: Diagnostic test results will improve Outcome: Progressing Goal: Respiratory complications will improve Outcome: Progressing Goal: Cardiovascular complication will be avoided Outcome: Progressing   Problem: Activity: Goal: Risk for activity intolerance will decrease Outcome: Progressing   Problem: Nutrition: Goal: Adequate nutrition will be maintained Outcome: Progressing   Problem: Coping: Goal: Level of anxiety will decrease Outcome: Progressing   Problem: Elimination: Goal: Will not experience complications related to bowel motility Outcome: Progressing Goal: Will not experience complications related to urinary retention Outcome: Progressing   Problem: Pain  Managment: Goal: General experience of comfort will improve Outcome: Progressing   Problem: Safety: Goal: Ability to remain free from injury will improve Outcome: Progressing   Problem: Skin Integrity: Goal: Risk for impaired skin integrity will decrease Outcome: Progressing   Problem: Education: Goal: Ability to describe self-care measures that may prevent or decrease complications (Diabetes Survival Skills Education) will improve Outcome: Progressing Goal: Individualized Educational Video(s) Outcome: Progressing   Problem: Coping: Goal: Ability to adjust to condition or change in health will improve Outcome: Progressing   Problem: Fluid Volume: Goal: Ability to maintain a balanced intake and output will improve Outcome: Progressing   Problem: Health Behavior/Discharge Planning: Goal: Ability to identify and utilize available resources and services will improve Outcome: Progressing Goal: Ability to manage health-related needs will improve Outcome: Progressing   Problem: Metabolic: Goal: Ability to maintain appropriate glucose levels will improve Outcome: Progressing   Problem: Nutritional: Goal: Maintenance of adequate nutrition will improve Outcome: Progressing Goal: Progress toward achieving an optimal weight will improve Outcome: Progressing   Problem: Skin Integrity: Goal: Risk for impaired skin integrity will decrease Outcome: Progressing   Problem: Tissue Perfusion: Goal: Adequacy of tissue perfusion will improve Outcome: Progressing

## 2023-02-08 NOTE — Progress Notes (Signed)
Progress Note    [name]  ZOX:[id number] DOB: 01-29-1931  DOA: 02/04/2023 PCP: Sherron Monday, MD      Brief Narrative:    Medical records reviewed and are as summarized below:  [name] is a 89 y.o. male  medical history significant for CAD s/p CABG and coronary stents, ESRD on hemodialysis, atrial fibrillation, ascending aortic aneurysm, HFrEF, hypertension, hyperlipidemia, type II DM, squamous cell carcinoma of scalp, recent right total hip replacement on 01/12/2023, recent hospitalization (from 01/21/2023 through 01/26/2023) for left arm AV fistula malfunction that was repaired with mechanical thrombectomy on 01/25/2023.  He presented from the outpatient hemodialysis to the emergency department because of left arm AV fistula malfunction.  He could not have dialysis because of clotted left arm AV fistula.  His main complaint is swelling of bilateral lower extremities.       Assessment/Plan:   Principal Problem:   Dialysis AV fistula malfunction (HCC) Active Problems:   CAD (coronary artery disease)   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)   Chronic systolic HF (heart failure) (HCC)   Hypokalemia   Body mass index is 26.93 kg/m.   Left arm AV fistula function, ESRD: S/p placement of left femoral temporary dialysis cath on 02/04/2023.  Plan for left arm AV fistula repair today.  Follow-up with nephrologist and vascular surgeon.     CAD s/p CABG and coronary stents: Continue Crestor.  Hold Plavix in anticipation of vascular procedures.  Continue low-dose aspirin while Plavix is on hold.     Mild hypokalemia: Repeat BMP tomorrow.     Anemia of chronic disease: H&H is stable.  Erythropoietin with hemodialysis.     History of chronic hypotension: Continue midodrine     Paroxysmal atrial fibrillation: Not on long-term anticoagulation because of high fall risk.  Had been taken off of sotalol in the past because of bradycardia.      Chronic HFrEF: compensated.  2D echo in August 2024 showed EF estimated at 35 to 40%, mild concentric LVH, indeterminate LV diastolic parameters, moderately elevated pulmonary artery systolic pressure, mild to moderate aortic regurgitation.    Stage II coccygeal and bilateral buttock decubitus ulcers: Present on admission.  Continue local wound care.   Recent right total hip replacement on 01/12/2023: Patient came from rehab facility.  PT recommended discharge back to SNF.   Diet Order             Diet NPO time specified  Diet effective midnight                            Consultants: Nephrologist Vascular surgeon  Procedures: Placement of right femoral temporary dialysis catheter on 02/04/2023    Medications:    [MAR Hold] aspirin EC  81 mg Oral Daily   [MAR Hold] Chlorhexidine Gluconate Cloth  6 each Topical Q0600   [MAR Hold] epoetin (EPOGEN/PROCRIT) injection  10,000 Units Intravenous Q M,W,F-HD   [MAR Hold] heparin  5,000 Units Subcutaneous Q8H   [MAR Hold] insulin aspart  0-6 Units Subcutaneous TID WC   [MAR Hold] melatonin  5 mg Oral QHS   [MAR Hold] midodrine  5 mg Oral TID WC   [MAR Hold] rosuvastatin  40 mg Oral Daily   Continuous Infusions:  sodium chloride 10 mL/hr at 02/08/23 1528    ceFAZolin (ANCEF) IV       Anti-infectives (From admission, onward)  Start     Dose/Rate Route Frequency Ordered Stop   02/08/23 1442  ceFAZolin (ANCEF) IVPB 1 g/50 mL premix        1 g 100 mL/hr over 30 Minutes Intravenous 30 min pre-op 02/08/23 1442                Family Communication/Anticipated D/C date and plan/Code Status   DVT prophylaxis: heparin injection 5,000 Units Start: 02/05/23 0600     Code Status: Limited: Do not attempt resuscitation (DNR) -DNR-LIMITED -Do Not Intubate/DNI   Family Communication: None Disposition Plan: Plan to discharge SNF tomorrow   Status is: Inpatient Remains inpatient appropriate because: Left arm AV  fistula malfunction       Subjective:   Interval events noted.  He has no complaints.  He said he is just waiting to have his procedure.  Objective:    Vitals:   02/07/23 2106 02/08/23 0413 02/08/23 0936 02/08/23 1520  BP: (!) 104/55 114/62 (!) 110/59 113/69  Pulse: 91 (!) 103 86 89  Resp: 20 20 18 18   Temp: 98.2 F (36.8 C) 98.6 F (37 C) 98 F (36.7 C) 98.1 F (36.7 C)  TempSrc: Oral Oral Oral   SpO2: 97% 97% 98% 95%  Weight:      Height:       No data found.   Intake/Output Summary (Last 24 hours) at 02/08/2023 1626 Last data filed at 02/07/2023 1926 Gross per 24 hour  Intake 240 ml  Output --  Net 240 ml   Filed Weights   02/05/23 0500  Weight: 92.6 kg    Exam:  GEN: NAD SKIN: Warm and dry EYES: EOMI ENT: MMM CV: RRR PULM: CTA B ABD: soft, ND, NT, +BS CNS: AAO x 3, non focal EXT: Chronic right lower extremity edema      Data Reviewed:   I have personally reviewed following labs and imaging studies:  Labs: Labs show the following:   Basic Metabolic Panel: Recent Labs  Lab 02/04/23 1625 02/05/23 1545 02/07/23 0956  NA 136 135 135  K 3.4* 3.3* 3.3*  CL 93* 94* 97*  CO2 28 28 28   GLUCOSE 134* 188* 105*  BUN 41* 49* 34*  CREATININE 4.26* 5.20* 3.91*  CALCIUM 8.6* 8.1* 8.1*  PHOS  --  4.7* 3.2   GFR Estimated Creatinine Clearance: 14.2 mL/min (A) (by C-G formula based on SCr of 3.91 mg/dL (H)). Liver Function Tests: Recent Labs  Lab 02/04/23 1625 02/05/23 1545 02/07/23 0956  AST 28  --   --   ALT 10  --   --   ALKPHOS 263*  --   --   BILITOT 0.8  --   --   PROT 6.8  --   --   ALBUMIN 3.1* 2.6* 2.6*   No results for input(s): "LIPASE", "AMYLASE" in the last 168 hours. No results for input(s): "AMMONIA" in the last 168 hours. Coagulation profile No results for input(s): "INR", "PROTIME" in the last 168 hours.  CBC: Recent Labs  Lab 02/04/23 1625 02/05/23 1545 02/07/23 0956  WBC 7.0 6.6 6.3  NEUTROABS 4.7  --   --    HGB 8.3* 7.6* 7.6*  HCT 26.5* 24.4* 23.8*  MCV 99.3 97.2 95.6  PLT 219 194 184   Cardiac Enzymes: No results for input(s): "CKTOTAL", "CKMB", "CKMBINDEX", "TROPONINI" in the last 168 hours. BNP (last 3 results) No results for input(s): "PROBNP" in the last 8760 hours. CBG: Recent Labs  Lab 02/07/23 1629 02/07/23  2144 02/08/23 0934 02/08/23 1123 02/08/23 1525  GLUCAP 99 199* 97 99 111*   D-Dimer: No results for input(s): "DDIMER" in the last 72 hours. Hgb A1c: No results for input(s): "HGBA1C" in the last 72 hours. Lipid Profile: No results for input(s): "CHOL", "HDL", "LDLCALC", "TRIG", "CHOLHDL", "LDLDIRECT" in the last 72 hours. Thyroid function studies: No results for input(s): "TSH", "T4TOTAL", "T3FREE", "THYROIDAB" in the last 72 hours.  Invalid input(s): "FREET3" Anemia work up: No results for input(s): "VITAMINB12", "FOLATE", "FERRITIN", "TIBC", "IRON", "RETICCTPCT" in the last 72 hours. Sepsis Labs: Recent Labs  Lab 02/04/23 1625 02/05/23 1545 02/07/23 0956  WBC 7.0 6.6 6.3    Microbiology No results found for this or any previous visit (from the past 240 hour(s)).  Procedures and diagnostic studies:  No results found.             LOS: 4 days   Adylynn Hertenstein  [facility]   Pager on [email or web address]. If 7PM-7AM, please contact night-coverage at [email or web address]     02/08/2023, 4:26 PM

## 2023-02-08 NOTE — Progress Notes (Signed)
[facility]  ROUNDING NOTE   Subjective:   [name] was admitted to [facility] on 02/04/2023 for Dialysis AV fistula malfunction (HCC) [T82.590A] Problem with vascular access [Z78.9]  Patient sitting up in bed NPO for vascular procedure  No complaints to offer  Dialysis yesterday, UF 1.5L  Objective:  Vital signs in last 24 hours:  Temp:  [98 F (36.7 C)-98.6 F (37 C)] 98 F (36.7 C) (09/17 0936) Pulse Rate:  [86-103] 86 (09/17 0936) Resp:  [4-24] 18 (09/17 0936) BP: (104-129)/(55-83) 110/59 (09/17 0936) SpO2:  [97 %-100 %] 98 % (09/17 0936)  Weight change:  Filed Weights   02/05/23 0500  Weight: 92.6 kg    Intake/Output: I/O last 3 completed shifts: In: 480 [P.O.:480] Out: 1950 [Urine:450; Other:1500]   Intake/Output this shift:  No intake/output data recorded.  Physical Exam: General: NAD, laying in bed   Head: Normocephalic, atraumatic. Moist oral mucosal membranes  Eyes: Anicteric  Neck: Supple  Lungs:  Clear to auscultation  Heart: Regular rate and rhythm  Abdomen:  Soft, nontender  Extremities:  no peripheral edema.  Neurologic: Nonfocal, moving all four extremities  Skin: No lesions  Access: Left AVF with no thrill or bruit, Right femoral temp HD catheter 9/13    Basic Metabolic Panel: Recent Labs  Lab 02/04/23 1625 02/05/23 1545 02/07/23 0956  NA 136 135 135  K 3.4* 3.3* 3.3*  CL 93* 94* 97*  CO2 28 28 28   GLUCOSE 134* 188* 105*  BUN 41* 49* 34*  CREATININE 4.26* 5.20* 3.91*  CALCIUM 8.6* 8.1* 8.1*  PHOS  --  4.7* 3.2    Liver Function Tests: Recent Labs  Lab 02/04/23 1625 02/05/23 1545 02/07/23 0956  AST 28  --   --   ALT 10  --   --   ALKPHOS 263*  --   --   BILITOT 0.8  --   --   PROT 6.8  --   --   ALBUMIN 3.1* 2.6* 2.6*   No results for input(s): "LIPASE", "AMYLASE" in the last 168 hours. No results for input(s): "AMMONIA" in the last 168 hours.  CBC: Recent Labs  Lab 02/04/23 1625 02/05/23 1545  02/07/23 0956  WBC 7.0 6.6 6.3  NEUTROABS 4.7  --   --   HGB 8.3* 7.6* 7.6*  HCT 26.5* 24.4* 23.8*  MCV 99.3 97.2 95.6  PLT 219 194 184    Cardiac Enzymes: No results for input(s): "CKTOTAL", "CKMB", "CKMBINDEX", "TROPONINI" in the last 168 hours.  BNP: Invalid input(s): "POCBNP"  CBG: Recent Labs  Lab 02/07/23 1356 02/07/23 1629 02/07/23 2144 02/08/23 0934 02/08/23 1123  GLUCAP 97 99 199* 97 99    Microbiology: Results for orders placed or performed during the [name] encounter of 01/21/23  MRSA Next Gen by PCR, Nasal     Status: None   Collection Time: 01/22/23  6:33 AM   Specimen: Nasal Mucosa; Nasal Swab  Result Value Ref Range Status   MRSA by PCR Next Gen NOT DETECTED NOT DETECTED Final    Comment: (NOTE) The GeneXpert MRSA Assay (FDA approved for NASAL specimens only), is one component of a comprehensive MRSA colonization surveillance program. It is not intended to diagnose MRSA infection nor to guide or monitor treatment for MRSA infections. Test performance is not FDA approved in patients less than 45 years old. Performed at [name], [address], [place], [place] [address]     Coagulation Studies: No results for  input(s): "LABPROT", "INR" in the last 72 hours.  Urinalysis: No results for input(s): "COLORURINE", "LABSPEC", "PHURINE", "GLUCOSEU", "HGBUR", "BILIRUBINUR", "KETONESUR", "PROTEINUR", "UROBILINOGEN", "NITRITE", "LEUKOCYTESUR" in the last 72 hours.  Invalid input(s): "APPERANCEUR"    Imaging: No results found.   Medications:     aspirin EC  81 mg Oral Daily   Chlorhexidine Gluconate Cloth  6 each Topical Q0600   epoetin (EPOGEN/PROCRIT) injection  10,000 Units Intravenous Q M,W,F-HD   heparin  5,000 Units Subcutaneous Q8H   insulin aspart  0-6 Units Subcutaneous TID WC   melatonin  5 mg Oral QHS   midodrine  5 mg Oral TID WC   rosuvastatin  40 mg Oral Daily   acetaminophen **OR** acetaminophen, diphenhydrAMINE,  famotidine, HYDROmorphone (DILAUDID) injection, lidocaine (PF), methylPREDNISolone (SOLU-MEDROL) injection, midazolam, ondansetron **OR** ondansetron (ZOFRAN) IV, polyethylene glycol  Assessment/ Plan:  [name] is a 89 y.o.  male with end stage renal disease on hemodialysis, hypotension, hyperlipidemia, coronary artery disease status post CABG, atrial fibrillation, ascending aortic aneurysm, congestive hear failure, and diabetes mellitus type II who is [facility] to [name] on [facility]/04/[facility] for Dialysis AV fistula malfunction (HCC) [T82.590A] Problem with vascular access [Z78.9]  [facility] [address] MWF L AVF 92.5kg  End Stage Renal Disease: on hemodialysis. With dialysis device malfunction.  - Resume MWF schedule - Dialysis received yesterday with HD femoral temp catheter.  - Vascular rescheduled angiogram for today - Next treatment scheduled for Wednesday  Hypotension - Continue midodrine three times daily  Anemia with chronic kidney disease:   - Hgb 7.6 on 02/07/23 - EPO with MWF HD treatments  Secondary Hyperparathyroidism:  - Bone minerals acceptable. - Calcium and phosphorus within desired range   LOS: 4   9/17/202412:10 PM

## 2023-02-08 NOTE — Progress Notes (Signed)
PT Cancellation Note  Patient Details Name: KATE STONEBURNER MRN: [id number] DOB: July 24, 1930   Cancelled Treatment:     PT attempt. Pt endorses just returning to bed from sitting in recliner." I haven't slept all night and I have a procedure this afternoon. I've been NPO for last two days." Pt requested to hold PT at this time. PT will return at a later time/date but continue to follow per current POC.   Rushie Chestnut 02/08/2023, 10:21 AM

## 2023-02-08 NOTE — Plan of Care (Signed)
The patient was not able to have vascular procedure completed at this time and will have to go tomorrow. The patient is currently requesting a diet. Call bell at reach and bed alarm in place. Problem: Education: Goal: Verbalization of understanding the information provided (i.e., activity precautions, restrictions, etc) will improve Outcome: Not Progressing Goal: Individualized Educational Video(s) Outcome: Not Progressing   Problem: Activity: Goal: Ability to ambulate and perform ADLs will improve Outcome: Not Progressing   Problem: Clinical Measurements: Goal: Postoperative complications will be avoided or minimized Outcome: Not Progressing   Problem: Self-Concept: Goal: Ability to maintain and perform role responsibilities to the fullest extent possible will improve Outcome: Not Progressing   Problem: Pain Management: Goal: Pain level will decrease Outcome: Not Progressing   Problem: Education: Goal: Knowledge of General Education information will improve Description: Including pain rating scale, medication(s)/side effects and non-pharmacologic comfort measures Outcome: Not Progressing   Problem: Health Behavior/Discharge Planning: Goal: Ability to manage health-related needs will improve Outcome: Not Progressing   Problem: Clinical Measurements: Goal: Ability to maintain clinical measurements within normal limits will improve Outcome: Not Progressing Goal: Will remain free from infection Outcome: Not Progressing Goal: Diagnostic test results will improve Outcome: Not Progressing Goal: Respiratory complications will improve Outcome: Not Progressing Goal: Cardiovascular complication will be avoided Outcome: Not Progressing   Problem: Activity: Goal: Risk for activity intolerance will decrease Outcome: Not Progressing   Problem: Nutrition: Goal: Adequate nutrition will be maintained Outcome: Not Progressing   Problem: Coping: Goal: Level of anxiety will  decrease Outcome: Not Progressing   Problem: Elimination: Goal: Will not experience complications related to bowel motility Outcome: Not Progressing Goal: Will not experience complications related to urinary retention Outcome: Not Progressing   Problem: Pain Managment: Goal: General experience of comfort will improve Outcome: Not Progressing   Problem: Education: Goal: Ability to describe self-care measures that may prevent or decrease complications (Diabetes Survival Skills Education) will improve Outcome: Not Progressing Goal: Individualized Educational Video(s) Outcome: Not Progressing   Problem: Coping: Goal: Ability to adjust to condition or change in health will improve Outcome: Not Progressing   Problem: Fluid Volume: Goal: Ability to maintain a balanced intake and output will improve Outcome: Not Progressing

## 2023-02-09 ENCOUNTER — Ambulatory Visit: Payer: Medicare Other | Admitting: Dermatology

## 2023-02-09 ENCOUNTER — Encounter
Admission: EM | Disposition: A | Discharge status: Skilled Nursing Facility | Payer: Self-pay | Source: Home / Self Care | Attending: Internal Medicine

## 2023-02-09 DIAGNOSIS — T82590D Other mechanical complication of surgically created arteriovenous fistula, subsequent encounter: Secondary | ICD-10-CM

## 2023-02-09 DIAGNOSIS — N186 End stage renal disease: Secondary | ICD-10-CM

## 2023-02-09 DIAGNOSIS — T82868A Thrombosis of vascular prosthetic devices, implants and grafts, initial encounter: Secondary | ICD-10-CM | POA: Diagnosis not present

## 2023-02-09 DIAGNOSIS — Z992 Dependence on renal dialysis: Secondary | ICD-10-CM | POA: Diagnosis not present

## 2023-02-09 HISTORY — PX: DIALYSIS/PERMA CATHETER INSERTION: CATH118288

## 2023-02-09 HISTORY — PX: A/V FISTULAGRAM: CATH118298

## 2023-02-09 LAB — RENAL FUNCTION PANEL
Albumin: 2.6 g/dL — ABNORMAL LOW (ref 3.5–5.0)
Anion gap: 13 (ref 5–15)
BUN: 30 mg/dL — ABNORMAL HIGH (ref 8–23)
CO2: 28 mmol/L (ref 22–32)
Calcium: 8.4 mg/dL — ABNORMAL LOW (ref 8.9–10.3)
Chloride: 96 mmol/L — ABNORMAL LOW (ref 98–111)
Creatinine, Ser: 4.06 mg/dL — ABNORMAL HIGH (ref 0.61–1.24)
GFR, Estimated: 13 mL/min — ABNORMAL LOW (ref >60)
Glucose, Bld: 107 mg/dL — ABNORMAL HIGH (ref 70–99)
Phosphorus: 3.5 mg/dL (ref 2.5–4.6)
Potassium: 3.3 mmol/L — ABNORMAL LOW (ref 3.5–5.1)
Sodium: 137 mmol/L (ref 135–145)

## 2023-02-09 LAB — CBC
HCT: 24.9 % — ABNORMAL LOW (ref 39.0–52.0)
Hemoglobin: 7.9 g/dL — ABNORMAL LOW (ref 13.0–17.0)
MCH: 30.5 pg (ref 26.0–34.0)
MCHC: 31.7 g/dL (ref 30.0–36.0)
MCV: 96.1 fL (ref 80.0–100.0)
Platelets: 194 10*3/uL (ref 150–400)
RBC: 2.59 MIL/uL — ABNORMAL LOW (ref 4.22–5.81)
RDW: 16.4 % — ABNORMAL HIGH (ref 11.5–15.5)
WBC: 5.9 10*3/uL (ref 4.0–10.5)
nRBC: 0 % (ref 0.0–0.2)

## 2023-02-09 LAB — GLUCOSE, CAPILLARY
Glucose-Capillary: 92 mg/dL (ref 70–99)
Glucose-Capillary: 79 mg/dL (ref 70–99)
Glucose-Capillary: 91 mg/dL (ref 70–99)
Glucose-Capillary: 126 mg/dL — ABNORMAL HIGH (ref 70–99)
Glucose-Capillary: 176 mg/dL — ABNORMAL HIGH (ref 70–99)

## 2023-02-09 SURGERY — A/V FISTULAGRAM
Anesthesia: Moderate Sedation

## 2023-02-09 MED ORDER — FENTANYL CITRATE (PF) 100 MCG/2ML IJ SOLN
INTRAMUSCULAR | Status: DC | PRN
  Administered 2023-02-09: 25 ug via INTRAVENOUS

## 2023-02-09 MED ORDER — EPOETIN ALFA 10000 UNIT/ML IJ SOLN
INTRAMUSCULAR | Status: AC
  Filled 2023-02-09: qty 1

## 2023-02-09 MED ORDER — HYDROMORPHONE HCL 1 MG/ML IJ SOLN: 1.0000 mg | Freq: Once | INTRAMUSCULAR | Status: DC | PRN

## 2023-02-09 MED ORDER — ALTEPLASE 2 MG IJ SOLR: 2.0000 mg | Freq: Once | INTRAMUSCULAR | Status: DC | PRN

## 2023-02-09 MED ORDER — LIDOCAINE-PRILOCAINE 2.5-2.5 % EX CREA: 1.0000 | TOPICAL_CREAM | CUTANEOUS | Status: DC | PRN

## 2023-02-09 MED ORDER — MIDAZOLAM HCL 2 MG/ML PO SYRP: 8.0000 mg | ORAL_SOLUTION | Freq: Once | ORAL | Status: DC | PRN

## 2023-02-09 MED ORDER — MIDAZOLAM HCL 2 MG/2ML IJ SOLN
INTRAMUSCULAR | Status: DC | PRN
  Administered 2023-02-09: 1 mg via INTRAVENOUS

## 2023-02-09 MED ORDER — FENTANYL CITRATE (PF) 100 MCG/2ML IJ SOLN
INTRAMUSCULAR | Status: AC
  Filled 2023-02-09: qty 2

## 2023-02-09 MED ORDER — METHYLPREDNISOLONE SODIUM SUCC 125 MG IJ SOLR: 125.0000 mg | Freq: Once | INTRAMUSCULAR | Status: DC | PRN

## 2023-02-09 MED ORDER — DIPHENHYDRAMINE HCL 50 MG/ML IJ SOLN: 50.0000 mg | Freq: Once | INTRAMUSCULAR | Status: DC | PRN

## 2023-02-09 MED ORDER — HEPARIN SODIUM (PORCINE) 1000 UNIT/ML IJ SOLN
INTRAMUSCULAR | Status: AC
  Filled 2023-02-09: qty 10

## 2023-02-09 MED ORDER — ALTEPLASE 2 MG IJ SOLR
INTRAMUSCULAR | Status: AC
  Filled 2023-02-09: qty 4

## 2023-02-09 MED ORDER — FENTANYL CITRATE PF 50 MCG/ML IJ SOSY: 12.5000 ug | PREFILLED_SYRINGE | Freq: Once | INTRAMUSCULAR | Status: DC | PRN

## 2023-02-09 MED ORDER — CEFAZOLIN SODIUM-DEXTROSE 1-4 GM/50ML-% IV SOLN
INTRAVENOUS | Status: AC
  Filled 2023-02-09: qty 50

## 2023-02-09 MED ORDER — ALTEPLASE 1 MG/ML SYRINGE FOR VASCULAR PROCEDURE
INTRAMUSCULAR | Status: DC | PRN
  Administered 2023-02-09: 4 mg via INTRA_ARTERIAL

## 2023-02-09 MED ORDER — HEPARIN SODIUM (PORCINE) 1000 UNIT/ML DIALYSIS: 1000.0000 [IU] | INTRAMUSCULAR | Status: DC | PRN

## 2023-02-09 MED ORDER — CEFAZOLIN SODIUM-DEXTROSE 1-4 GM/50ML-% IV SOLN
1.0000 g | INTRAVENOUS | Status: AC
  Administered 2023-02-09: 1 g via INTRAVENOUS

## 2023-02-09 MED ORDER — FAMOTIDINE 20 MG PO TABS: 40.0000 mg | ORAL_TABLET | Freq: Once | ORAL | Status: DC | PRN

## 2023-02-09 MED ORDER — IODIXANOL 320 MG/ML IV SOLN
INTRAVENOUS | Status: DC | PRN
  Administered 2023-02-09: 20 mL

## 2023-02-09 MED ORDER — ONDANSETRON HCL 4 MG/2ML IJ SOLN: 4.0000 mg | Freq: Four times a day (QID) | INTRAMUSCULAR | Status: DC | PRN

## 2023-02-09 MED ORDER — HEPARIN (PORCINE) IN NACL 2000-0.9 UNIT/L-% IV SOLN
INTRAVENOUS | Status: DC | PRN
  Administered 2023-02-09: 1000 mL

## 2023-02-09 MED ORDER — SODIUM CHLORIDE 0.9 % IV SOLN: INTRAVENOUS | Status: DC

## 2023-02-09 MED ORDER — LIDOCAINE-EPINEPHRINE (PF) 1 %-1:200000 IJ SOLN
INTRAMUSCULAR | Status: DC | PRN
  Administered 2023-02-09: 10 mL via INTRADERMAL

## 2023-02-09 MED ORDER — HEPARIN SODIUM (PORCINE) 1000 UNIT/ML IJ SOLN
INTRAMUSCULAR | Status: DC | PRN
  Administered 2023-02-09: 3000 [IU] via INTRAVENOUS

## 2023-02-09 MED ORDER — PENTAFLUOROPROP-TETRAFLUOROETH EX AERO: 1.0000 | INHALATION_SPRAY | CUTANEOUS | Status: DC | PRN

## 2023-02-09 MED ORDER — MIDAZOLAM HCL 5 MG/5ML IJ SOLN
INTRAMUSCULAR | Status: AC
  Filled 2023-02-09: qty 5

## 2023-02-09 SURGICAL SUPPLY — 16 items
BALLN LUTONIX DCB 6X80X130 (BALLOONS) ×2 IMPLANT
BALLOON LUTONIX DCB 6X80X130 (BALLOONS) IMPLANT
CANNULA 5F STIFF (CANNULA) IMPLANT
CATH EMBOLECTOMY 5FR (BALLOONS) IMPLANT
CATH THROMBEC 7F 65 CLEANER15 (MISCELLANEOUS) IMPLANT
COVER PROBE ULTRASOUND 5X96 (MISCELLANEOUS) IMPLANT
DRAPE BRACHIAL (DRAPES) IMPLANT
KIT ENCORE 26 ADVANTAGE (KITS) IMPLANT
NDL ENTRY 21GA 7CM ECHOTIP (NEEDLE) IMPLANT
NEEDLE ENTRY 21GA 7CM ECHOTIP (NEEDLE) ×2 IMPLANT
PACK ANGIOGRAPHY (CUSTOM PROCEDURE TRAY) ×2 IMPLANT
SET INTRO CAPELLA COAXIAL (SET/KITS/TRAYS/PACK) IMPLANT
SHEATH BRITE TIP 6FRX5.5 (SHEATH) IMPLANT
SHEATH BRITE TIP 7FRX5.5 (SHEATH) IMPLANT
SUT MNCRL AB 4-0 PS2 18 (SUTURE) IMPLANT
WIRE SUPRACORE 190CM (WIRE) IMPLANT

## 2023-02-09 NOTE — Progress Notes (Signed)
PROGRESS NOTE    Matthew [name]  ACZ:[id number] DOB: 1930-06-03 DOA: 02/04/2023 PCP: Sherron Monday, MD    Brief Narrative:   Matthew [name] is a 89 y.o. male  medical history significant for CAD s/p CABG and coronary stents, ESRD on hemodialysis, atrial fibrillation, ascending aortic aneurysm, HFrEF, hypertension, hyperlipidemia, type II DM, squamous cell carcinoma of scalp, recent right total hip replacement on 01/12/2023, recent hospitalization (from 01/21/2023 through 01/26/2023) for left arm AV fistula malfunction that was repaired with mechanical thrombectomy on 01/25/2023.  He presented from the outpatient hemodialysis to the emergency department because of left arm AV fistula malfunction.  He could not have dialysis because of clotted left arm AV fistula.  His main complaint is swelling of bilateral lower extremities.     Assessment & Plan:   Principal Problem:   Dialysis AV fistula malfunction (HCC) Active Problems:   CAD (coronary artery disease)   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)   Chronic systolic HF (heart failure) (HCC)   Hypokalemia  Left arm AV fistula function, ESRD: S/p placement of left femoral temporary dialysis cath on 02/04/2023.  Status post fistulogram with catheter directed thrombolysis to AV graft.  Plan for HD tomorrow and discharge after if fistula functional     CAD s/p CABG and coronary stents: Continue Crestor.  Plavix is on hold.  Will continue to hold for today and restart tomorrow fistula functional     Mild hypokalemia: Resolved with HD     Anemia of chronic disease: H&H is stable.  Erythropoietin with hemodialysis.     History of chronic hypotension: Continue midodrine     Paroxysmal atrial fibrillation: Not on long-term anticoagulation because of high fall risk.  Had been taken off of sotalol in the past because of bradycardia.     Chronic HFrEF: compensated.  2D echo in August 2024 showed EF estimated at 35 to 40%, mild  concentric LVH, indeterminate LV diastolic parameters, moderately elevated pulmonary artery systolic pressure, mild to moderate aortic regurgitation.     Stage II coccygeal and bilateral buttock decubitus ulcers: Present on admission.  Continue local wound care.     Recent right total hip replacement on 01/12/2023: Patient came from rehab facility.  PT recommended discharge back to SNF.   DVT prophylaxis: SQ heparin Code Status: DNR Family Communication: None today Disposition Plan: Status is: Inpatient Remains inpatient appropriate because: Nonfunctional AV fistula.  Status post repair.  Plan for dialysis 9/19 and discharge tomorrow   Level of care: Med-Surg  Consultants:  Vascular surgery Nephrology  Procedures:  Fistulogram with mechanical thrombectomy  Antimicrobials: None   Subjective: Seen.  Resting in bed.  No visible distress  Objective: Vitals:   02/09/23 1204 02/09/23 1215 02/09/23 1230 02/09/23 1254  BP:  (!) 107/46 (!) 103/52 101/60  Pulse: (!) 0  (!) 112 95  Resp:  16 17 17   Temp:    98.3 F (36.8 C)  TempSrc:    Oral  SpO2:   97% 98%  Weight:      Height:        Intake/Output Summary (Last 24 hours) at 02/09/2023 1609 Last data filed at 02/09/2023 1528 Gross per 24 hour  Intake 94.17 ml  Output 1069.7 ml  Net -975.53 ml   Filed Weights   02/09/23 0548 02/09/23 0749  Weight: 92.8 kg 92.8 kg    Examination:  General exam: Appears calm and comfortable  Respiratory system: Clear to auscultation. Respiratory  effort normal. Cardiovascular system: S1-S2, RRR, no murmurs, no pedal edema Gastrointestinal system: Soft, NT/ND, normal bowel sounds Central nervous system: Alert.  Oriented x 1.  No focal deficits Extremities: Left extremity AV fistula with palpable thrill Skin: No rashes, lesions or ulcers Psychiatry: Judgement and insight appear impaired. Mood & affect flattened.     Data Reviewed: I have personally reviewed following labs and  imaging studies  CBC: Recent Labs  Lab 02/04/23 1625 02/05/23 1545 02/07/23 0956 02/09/23 0603 02/09/23 0807  WBC 7.0 6.6 6.3 6.0 5.9  NEUTROABS 4.7  --   --  3.4  --   HGB 8.3* 7.6* 7.6* 8.4* 7.9*  HCT 26.5* 24.4* 23.8* 26.7* 24.9*  MCV 99.3 97.2 95.6 96.4 96.1  PLT 219 194 184 198 194   Basic Metabolic Panel: Recent Labs  Lab 02/04/23 1625 02/05/23 1545 02/07/23 0956 02/09/23 0603 02/09/23 0807  NA 136 135 135 136 137  K 3.4* 3.3* 3.3* 3.3* 3.3*  CL 93* 94* 97* 96* 96*  CO2 28 28 28 29 28   GLUCOSE 134* 188* 105* 114* 107*  BUN 41* 49* 34* 29* 30*  CREATININE 4.26* 5.20* 3.91* 3.99* 4.06*  CALCIUM 8.6* 8.1* 8.1* 8.5* 8.4*  PHOS  --  4.7* 3.2  --  3.5   GFR: Estimated Creatinine Clearance: 13.7 mL/min (A) (by C-G formula based on SCr of 4.06 mg/dL (H)). Liver Function Tests: Recent Labs  Lab 02/04/23 1625 02/05/23 1545 02/07/23 0956 02/09/23 0807  AST 28  --   --   --   ALT 10  --   --   --   ALKPHOS 263*  --   --   --   BILITOT 0.8  --   --   --   PROT 6.8  --   --   --   ALBUMIN 3.1* 2.6* 2.6* 2.6*   No results for input(s): "LIPASE", "AMYLASE" in the last 168 hours. No results for input(s): "AMMONIA" in the last 168 hours. Coagulation Profile: No results for input(s): "INR", "PROTIME" in the last 168 hours. Cardiac Enzymes: No results for input(s): "CKTOTAL", "CKMB", "CKMBINDEX", "TROPONINI" in the last 168 hours. BNP (last 3 results) No results for input(s): "PROBNP" in the last 8760 hours. HbA1C: No results for input(s): "HGBA1C" in the last 72 hours. CBG: Recent Labs  Lab 02/08/23 1820 02/08/23 2046 02/09/23 0734 02/09/23 1102 02/09/23 1251  GLUCAP 103* 168* 92 79 91   Lipid Profile: No results for input(s): "CHOL", "HDL", "LDLCALC", "TRIG", "CHOLHDL", "LDLDIRECT" in the last 72 hours. Thyroid Function Tests: No results for input(s): "TSH", "T4TOTAL", "FREET4", "T3FREE", "THYROIDAB" in the last 72 hours. Anemia Panel: No results for  input(s): "VITAMINB12", "FOLATE", "FERRITIN", "TIBC", "IRON", "RETICCTPCT" in the last 72 hours. Sepsis Labs: No results for input(s): "PROCALCITON", "LATICACIDVEN" in the last 168 hours.  No results found for this or any previous visit (from the past 240 hour(s)).       Radiology Studies: PERIPHERAL VASCULAR CATHETERIZATION  Result Date: 02/09/2023 See surgical note for result.       Scheduled Meds:  aspirin EC  81 mg Oral Daily   Chlorhexidine Gluconate Cloth  6 each Topical Q0600   epoetin (EPOGEN/PROCRIT) injection  10,000 Units Intravenous Q M,W,F-HD   heparin  5,000 Units Subcutaneous Q8H   insulin aspart  0-6 Units Subcutaneous TID WC   melatonin  5 mg Oral QHS   midodrine  5 mg Oral TID WC   rosuvastatin  40 mg Oral Daily  Continuous Infusions:   LOS: 5 days     Matthew Moore, MD [facility]   If 7PM-7AM, please contact night-coverage  02/09/2023, 4:09 PM

## 2023-02-09 NOTE — Progress Notes (Signed)
PT Cancellation Note  Patient Details Name: Matthew [name] MRN: [id number] DOB: [birth year]   Cancelled Treatment:    Reason Eval/Treat Not Completed: Other (comment). Pt currently at HD at this time with pending vascular procedure after HD. Will likely defer session to tomorrow. Of note, pt with temp fem cath that is actively being used for HD. Secure chat sent to MD for mobility clearance if allowed. Will continue to progress as able.   Navraj Dreibelbis 02/09/2023, 9:52 AM Elizabeth Palau, PT, DPT, GCS [phone]

## 2023-02-09 NOTE — Interval H&P Note (Signed)
History and Physical Interval Note:  02/09/2023 11:05 AM  Matthew [name]  has presented today for surgery, with the diagnosis of ESRD.  The various methods of treatment have been discussed with the patient and family. After consideration of risks, benefits and other options for treatment, the patient has consented to  Procedure(s): A/V Fistulagram (Left) DIALYSIS/PERMA CATHETER INSERTION (N/A) as a surgical intervention.  The patient's history has been reviewed, patient examined, no change in status, stable for surgery.  I have reviewed the patient's chart and labs.  Questions were answered to the patient's satisfaction.     Festus Barren

## 2023-02-09 NOTE — Op Note (Signed)
[facility]    OPERATIVE NOTE   PROCEDURE: 1.  Brachial artery to axillary vein arteriovenous graft cannulation under ultrasound guidance in both a retrograde and then antegrade fashion crossing 2.  Left arm shuntogram and central venogram 3.  Catheter directed thrombolysis with 4 mg of TPA  4.  Mechanical thrombectomy to the left brachial artery to axillary vein AV graft with the cleaner device 5.  Fogarty embolectomy for residual arterial plug 6.  Percutaneous transluminal angioplasty of arterial anastomosis with 6 mm diameter by 8 cm length Lutonix drug-coated angioplasty balloon   PRE-OPERATIVE DIAGNOSIS: 1. ESRD 2.  Thrombosed left brachial artery to axillary vein arteriovenous graft  POST-OPERATIVE DIAGNOSIS: same as above   SURGEON: Festus Barren, MD  ANESTHESIA: local with Moderate Conscious Sedation for approximately 33 minutes using 1 mg of Versed and 25 mcg of Fentanyl  ESTIMATED BLOOD LOSS: 25 cc  FINDING(S): Thrombosed left brachial artery to axillary vein AV graft requiring thrombectomy  SPECIMEN(S):  None  CONTRAST: 20 cc  FLUORO TIME: 4.9 minutes  INDICATIONS: Patient is a 89 y.o.male who presents with a thrombosed left brachial artery to axillary vein arteriovenous graft.  The patient is scheduled for an attempted declot and shuntogram.  The patient is aware the risks include but are not limited to: bleeding, infection, thrombosis of the cannulated access, and possible anaphylactic reaction to the contrast.  The patient is aware of the risks of the procedure and elects to proceed forward.  DESCRIPTION: After full informed written consent was obtained, the patient was brought back to the angiography suite and placed supine upon the angiography table.  The patient was connected to monitoring equipment. Moderate conscious sedation was administered with a face to face encounter with the patient throughout the procedure with my supervision of the  RN administering medicines and monitoring the patient's vital signs, pulse oximetry, telemetry and mental status throughout from the start of the procedure until the patient was taken to the recovery room. The left arm was prepped and draped in the standard fashion for a percutaneous access intervention.  Under ultrasound guidance, the left brachial artery to axillary vein arteriovenous graft was cannulated with a micropuncture needle under direct ultrasound guidance due to the pulseless nature of the graft in both an antegrade and a retrograde fashion crossing, and permanent images were performed.  The microwire was advanced and the needle was exchanged for the a microsheath.  I then upsized to a 6 Fr Sheath and imaging was performed.  Hand injections were completed to image the access including the central venous system. This demonstrated no flow within the AV graft.  Based on the images, this patient will need extensive treatment to salvage the graft. I then gave the patient 3000 units of intravenous heparin.  I upsized to a 7 [place] sheath on the antegrade access site.  The retrograde access site remained a 6 [place] sheath initially.  I then placed a Supracore wire into the brachial artery from the retrograde sheath and into the axillary vein from the antegrade sheath. 4 mg of TPA were deployed. This was allowed to dwell. Mechanical thrombectomy using the cleaner device through the 7 French sheath placed in an antegrade fashion was then performed throughout the graft and into the axillary vein. This uncovered a residual arterial plug was also seen at the arterial anastomosis. An attempt to clear the arterial plug was done with 5 passes of the Fogarty embolectomy balloon. Flow-limiting arterial plug remained, and I  elected to treat this lesion with a 6 mm diameter by 8 cm length Lutonix drug-coated angioplasty balloon. This resulted in resolution of the arterial plug for the most part, but there remains some  nonocclusive thrombus in this location.  The mid and distal graft and axillary vein had been cleared with the cleaner thrombectomy and the central venous circulation was widely patent.  I then remove the antegrade sheath and placed a Monocryl suture at the access site.  I upsized to a 7 [place] sheath on the retrograde side and remove the wire and performed mechanical thrombectomy with the cleaner device at the arterial anastomosis and the initial portion of the AV graft.  This resulted in clearance of the thrombus and the graft was now widely patent.  Based on the completion imaging, no further intervention is necessary.  The wire and balloon were removed from the sheath.  A 4-0 Monocryl purse-string suture was sewn around the sheath.  The sheath was removed while tying down the suture.  A sterile bandage was applied to the puncture site.  COMPLICATIONS: None  CONDITION: Stable   Festus Barren 02/09/2023 12:18 PM   This note was created with Dragon Medical transcription system. Any errors in dictation are purely unintentional.

## 2023-02-09 NOTE — TOC Progression Note (Signed)
Transition of Care [facility]) - Progression Note    Patient Details  Name: Matthew [name] MRN: [id number] Date of Birth: August 08, 1930  Transition of Care [facility]) CM/SW Contact  Chapman Fitch, RN Phone Number: 02/09/2023, 3:32 PM  Clinical Narrative:     Per MD potential DC tomorrow.  Tiffany at [employer] [employer] updated   Expected Discharge Plan: Skilled Nursing Facility Barriers to Discharge: Continued Medical Work up  Expected Discharge Plan and Services       Living arrangements for the past 2 months: Single Family Home, Skilled Nursing Facility                                       Social Determinants of Health (SDOH) Interventions SDOH Screenings   Food Insecurity: No Food Insecurity (02/04/2023)  Housing: Patient Declined (02/04/2023)  Transportation Needs: No Transportation Needs (02/04/2023)  Utilities: Not At Risk (02/04/2023)  Depression (PHQ2-9): Low Risk  (09/21/2022)  Financial Resource Strain: Low Risk  (01/10/2018)  Physical Activity: Inactive (01/10/2018)  Social Connections: Socially Integrated (01/10/2018)  Stress: No Stress Concern Present (01/10/2018)  Tobacco Use: Medium Risk (02/04/2023)    Readmission Risk Interventions    02/05/2023   11:48 AM 01/09/2023    4:42 PM 02/12/2021    3:28 PM  Readmission Risk Prevention Plan  Transportation Screening Complete Complete Complete  PCP or Specialist Appt within 3-5 Days  Complete   HRI or Home Care Consult  Complete   Social Work Consult for Recovery Care Planning/Counseling  Complete   Palliative Care Screening  Not Applicable   Medication Review [employer]) Complete Complete Complete  PCP or Specialist appointment within 3-5 days of discharge Complete    HRI or Home Care Consult Complete    SW Recovery Care/Counseling Consult Complete  Complete  Palliative Care Screening Not Applicable    Skilled Nursing Facility Complete  Complete

## 2023-02-09 NOTE — Progress Notes (Signed)
Hemodialysis Note  Received patient in bed to unit. Alert and oriented. Informed consent signed and in chart.   Treatment initiated:0850 Treatment completed:1039  Patient tolerated treatment well. Transported back to the room alert, without acute distress. Report given to patient's RN.  Access used:Right Femoral Catheter  Access issues:None   Total UF removed:619.7 ml  Medications given:Heparin, Epogen  Post HD VS: Stable  Post HD weight:92.2 kg   Bartolo Darter, RN [facility]

## 2023-02-09 NOTE — Progress Notes (Signed)
Patient on machine #8 for one minute. Machine presented with "RO Filter Failure" error. Machine prompted to end tx. Patient moved to [facility] 3 and Machine #4 set up for remainder of tx. Nurse manager aware.

## 2023-02-09 NOTE — Progress Notes (Addendum)
[facility]  ROUNDING NOTE   Subjective:   [name] was admitted to [facility] on 02/04/2023 for Dialysis AV fistula malfunction (HCC) [T82.590A] Problem with vascular access [Z78.9]  Patient seen and evaluated during dialysos   HEMODIALYSIS FLOWSHEET:  Blood Flow Rate (mL/min): 400 mL/min Arterial Pressure (mmHg): -201.2 mmHg Venous Pressure (mmHg): 187.06 mmHg TMP (mmHg): 7.27 mmHg Ultrafiltration Rate (mL/min): 829 mL/min Dialysate Flow Rate (mL/min): 299 ml/min Dialysis Fluid Bolus: Normal Saline  Tolerating treatment well NPO for angiogram later today  Objective:  Vital signs in last 24 hours:  Temp:  [97.7 F (36.5 C)-98.3 F (36.8 C)] 97.8 F (36.6 C) (09/18 0749) Pulse Rate:  [52-100] 52 (09/18 0900) Resp:  [17-22] 22 (09/18 0900) BP: (91-115)/(48-74) 109/56 (09/18 0900) SpO2:  [95 %-100 %] 98 % (09/18 0900) Weight:  [92.8 kg] 92.8 kg (09/18 0749)  Weight change:  Filed Weights   02/09/23 0548 02/09/23 0749  Weight: 92.8 kg 92.8 kg    Intake/Output: I/O last 3 completed shifts: In: 240 [P.O.:240] Out: 450 [Urine:450]   Intake/Output this shift:  No intake/output data recorded.  Physical Exam: General: NAD, laying in bed   Head: Normocephalic, atraumatic. Moist oral mucosal membranes  Eyes: Anicteric  Neck: Supple  Lungs:  Clear to auscultation  Heart: Regular rate and rhythm  Abdomen:  Soft, nontender  Extremities:  no peripheral edema.  Neurologic: Nonfocal, moving all four extremities  Skin: No lesions  Access: Left AVF with no thrill or bruit, Right femoral temp HD catheter 9/13    Basic Metabolic Panel: Recent Labs  Lab 02/04/23 1625 02/05/23 1545 02/07/23 0956 02/09/23 0603 02/09/23 0807  NA 136 135 135 136 137  K 3.4* 3.3* 3.3* 3.3* 3.3*  CL 93* 94* 97* 96* 96*  CO2 28 28 28 29 28   GLUCOSE 134* 188* 105* 114* 107*  BUN 41* 49* 34* 29* 30*  CREATININE 4.26* 5.20* 3.91* 3.99* 4.06*  CALCIUM 8.6* 8.1* 8.1* 8.5*  8.4*  PHOS  --  4.7* 3.2  --  3.5    Liver Function Tests: Recent Labs  Lab 02/04/23 1625 02/05/23 1545 02/07/23 0956 02/09/23 0807  AST 28  --   --   --   ALT 10  --   --   --   ALKPHOS 263*  --   --   --   BILITOT 0.8  --   --   --   PROT 6.8  --   --   --   ALBUMIN 3.1* 2.6* 2.6* 2.6*   No results for input(s): "LIPASE", "AMYLASE" in the last 168 hours. No results for input(s): "AMMONIA" in the last 168 hours.  CBC: Recent Labs  Lab 02/04/23 1625 02/05/23 1545 02/07/23 0956 02/09/23 0603 02/09/23 0807  WBC 7.0 6.6 6.3 6.0 5.9  NEUTROABS 4.7  --   --  3.4  --   HGB 8.3* 7.6* 7.6* 8.4* 7.9*  HCT 26.5* 24.4* 23.8* 26.7* 24.9*  MCV 99.3 97.2 95.6 96.4 96.1  PLT 219 194 184 198 194    Cardiac Enzymes: No results for input(s): "CKTOTAL", "CKMB", "CKMBINDEX", "TROPONINI" in the last 168 hours.  BNP: Invalid input(s): "POCBNP"  CBG: Recent Labs  Lab 02/08/23 1123 02/08/23 1525 02/08/23 1820 02/08/23 2046 02/09/23 0734  GLUCAP 99 111* 103* 168* 92    Microbiology: Results for orders placed or performed during the hospital encounter of 01/21/23  MRSA Next Gen by PCR, Nasal     Status: None  Collection Time: 01/22/23  6:33 AM   Specimen: Nasal Mucosa; Nasal Swab  Result Value Ref Range Status   MRSA by PCR Next Gen NOT DETECTED NOT DETECTED Final    Comment: (NOTE) The GeneXpert MRSA Assay (FDA approved for NASAL specimens only), is one component of a comprehensive MRSA colonization surveillance program. It is not intended to diagnose MRSA infection nor to guide or monitor treatment for MRSA infections. Test performance is not FDA approved in patients less than 31 years old. Performed at [facility], [address] [place], [place] [address]     Coagulation Studies: No results for input(s): "LABPROT", "INR" in the last 72 hours.  Urinalysis: No results for input(s): "COLORURINE", "LABSPEC", "PHURINE", "GLUCOSEU", "HGBUR", "BILIRUBINUR",  "KETONESUR", "PROTEINUR", "UROBILINOGEN", "NITRITE", "LEUKOCYTESUR" in the last 72 hours.  Invalid input(s): "APPERANCEUR"    Imaging: No results found.   Medications:     aspirin EC  81 mg Oral Daily   Chlorhexidine Gluconate Cloth  6 each Topical Q0600   epoetin (EPOGEN/PROCRIT) injection  10,000 Units Intravenous Q M,W,F-HD   heparin  5,000 Units Subcutaneous Q8H   insulin aspart  0-6 Units Subcutaneous TID WC   melatonin  5 mg Oral QHS   midodrine  5 mg Oral TID WC   rosuvastatin  40 mg Oral Daily   acetaminophen **OR** acetaminophen, ALPRAZolam, alteplase, heparin, lidocaine-prilocaine, ondansetron **OR** [DISCONTINUED] ondansetron (ZOFRAN) IV, pentafluoroprop-tetrafluoroeth, polyethylene glycol  Assessment/ Plan:  [name] is a 89 y.o.  male with end stage renal disease on hemodialysis, hypotension, hyperlipidemia, coronary artery disease status post CABG, atrial fibrillation, ascending aortic aneurysm, congestive hear failure, and diabetes mellitus type II who is admitted to [facility] on 02/04/2023 for Dialysis AV fistula malfunction (HCC) [T82.590A] Problem with vascular access [Z78.9]  [facility] MWF L AVF 92.5kg  End Stage Renal Disease: on hemodialysis. With dialysis device malfunction.  - Resume MWF schedule - Receiving treatment today, UF goal 2L as tolerated. - Next treatment scheduled for Thursday ** Treatment interrupted by vascular procedure, will provide shortened dialysis treatment tomorrow.   Hypotension - Continue midodrine three times daily  Anemia with chronic kidney disease:   - Hgb 7.9 - EPO with MWF HD treatments  Secondary Hyperparathyroidism:  - Calcium and phosphorus within desired range   LOS: 5   9/18/20249:33 AM

## 2023-02-09 NOTE — Plan of Care (Signed)
  Problem: Education: Goal: Verbalization of understanding the information provided (i.e., activity precautions, restrictions, etc) will improve Outcome: Progressing Goal: Individualized Educational Video(s) Outcome: Progressing   Problem: Activity: Goal: Ability to ambulate and perform ADLs will improve Outcome: Progressing   Problem: Clinical Measurements: Goal: Postoperative complications will be avoided or minimized Outcome: Progressing   Problem: Self-Concept: Goal: Ability to maintain and perform role responsibilities to the fullest extent possible will improve Outcome: Progressing   Problem: Pain Management: Goal: Pain level will decrease Outcome: Progressing   Problem: Education: Goal: Knowledge of General Education information will improve Description: Including pain rating scale, medication(s)/side effects and non-pharmacologic comfort measures Outcome: Progressing   Problem: Health Behavior/Discharge Planning: Goal: Ability to manage health-related needs will improve Outcome: Progressing   Problem: Clinical Measurements: Goal: Ability to maintain clinical measurements within normal limits will improve Outcome: Progressing Goal: Will remain free from infection Outcome: Progressing Goal: Diagnostic test results will improve Outcome: Progressing Goal: Respiratory complications will improve Outcome: Progressing Goal: Cardiovascular complication will be avoided Outcome: Progressing   Problem: Activity: Goal: Risk for activity intolerance will decrease Outcome: Progressing   Problem: Nutrition: Goal: Adequate nutrition will be maintained Outcome: Progressing   Problem: Coping: Goal: Level of anxiety will decrease Outcome: Progressing   Problem: Elimination: Goal: Will not experience complications related to bowel motility Outcome: Progressing Goal: Will not experience complications related to urinary retention Outcome: Progressing   Problem: Pain  Managment: Goal: General experience of comfort will improve Outcome: Progressing   Problem: Safety: Goal: Ability to remain free from injury will improve Outcome: Progressing   Problem: Skin Integrity: Goal: Risk for impaired skin integrity will decrease Outcome: Progressing   Problem: Education: Goal: Ability to describe self-care measures that may prevent or decrease complications (Diabetes Survival Skills Education) will improve Outcome: Progressing Goal: Individualized Educational Video(s) Outcome: Progressing   Problem: Coping: Goal: Ability to adjust to condition or change in health will improve Outcome: Progressing   Problem: Fluid Volume: Goal: Ability to maintain a balanced intake and output will improve Outcome: Progressing   Problem: Health Behavior/Discharge Planning: Goal: Ability to identify and utilize available resources and services will improve Outcome: Progressing Goal: Ability to manage health-related needs will improve Outcome: Progressing   Problem: Metabolic: Goal: Ability to maintain appropriate glucose levels will improve Outcome: Progressing   Problem: Nutritional: Goal: Maintenance of adequate nutrition will improve Outcome: Progressing Goal: Progress toward achieving an optimal weight will improve Outcome: Progressing   Problem: Skin Integrity: Goal: Risk for impaired skin integrity will decrease Outcome: Progressing   Problem: Tissue Perfusion: Goal: Adequacy of tissue perfusion will improve Outcome: Progressing

## 2023-02-10 ENCOUNTER — Encounter: Payer: Self-pay | Admitting: Vascular Surgery

## 2023-02-10 DIAGNOSIS — I959 Hypotension, unspecified: Secondary | ICD-10-CM | POA: Diagnosis not present

## 2023-02-10 DIAGNOSIS — N2581 Secondary hyperparathyroidism of renal origin: Secondary | ICD-10-CM | POA: Diagnosis not present

## 2023-02-10 DIAGNOSIS — Z23 Encounter for immunization: Secondary | ICD-10-CM | POA: Diagnosis not present

## 2023-02-10 DIAGNOSIS — J309 Allergic rhinitis, unspecified: Secondary | ICD-10-CM | POA: Diagnosis not present

## 2023-02-10 DIAGNOSIS — Z7401 Bed confinement status: Secondary | ICD-10-CM | POA: Diagnosis not present

## 2023-02-10 DIAGNOSIS — E039 Hypothyroidism, unspecified: Secondary | ICD-10-CM | POA: Diagnosis not present

## 2023-02-10 DIAGNOSIS — D638 Anemia in other chronic diseases classified elsewhere: Secondary | ICD-10-CM | POA: Diagnosis not present

## 2023-02-10 DIAGNOSIS — L89152 Pressure ulcer of sacral region, stage 2: Secondary | ICD-10-CM | POA: Diagnosis not present

## 2023-02-10 DIAGNOSIS — N186 End stage renal disease: Secondary | ICD-10-CM | POA: Diagnosis not present

## 2023-02-10 DIAGNOSIS — D509 Iron deficiency anemia, unspecified: Secondary | ICD-10-CM | POA: Diagnosis not present

## 2023-02-10 DIAGNOSIS — I2510 Atherosclerotic heart disease of native coronary artery without angina pectoris: Secondary | ICD-10-CM | POA: Diagnosis not present

## 2023-02-10 DIAGNOSIS — E1122 Type 2 diabetes mellitus with diabetic chronic kidney disease: Secondary | ICD-10-CM | POA: Diagnosis not present

## 2023-02-10 DIAGNOSIS — M810 Age-related osteoporosis without current pathological fracture: Secondary | ICD-10-CM | POA: Diagnosis not present

## 2023-02-10 DIAGNOSIS — I951 Orthostatic hypotension: Secondary | ICD-10-CM | POA: Diagnosis not present

## 2023-02-10 DIAGNOSIS — Z992 Dependence on renal dialysis: Secondary | ICD-10-CM | POA: Diagnosis not present

## 2023-02-10 DIAGNOSIS — S72141G Displaced intertrochanteric fracture of right femur, subsequent encounter for closed fracture with delayed healing: Secondary | ICD-10-CM | POA: Diagnosis not present

## 2023-02-10 DIAGNOSIS — R600 Localized edema: Secondary | ICD-10-CM | POA: Diagnosis not present

## 2023-02-10 DIAGNOSIS — Z7901 Long term (current) use of anticoagulants: Secondary | ICD-10-CM | POA: Diagnosis not present

## 2023-02-10 DIAGNOSIS — D631 Anemia in chronic kidney disease: Secondary | ICD-10-CM | POA: Diagnosis not present

## 2023-02-10 DIAGNOSIS — M1990 Unspecified osteoarthritis, unspecified site: Secondary | ICD-10-CM | POA: Diagnosis not present

## 2023-02-10 DIAGNOSIS — Z7982 Long term (current) use of aspirin: Secondary | ICD-10-CM | POA: Diagnosis not present

## 2023-02-10 DIAGNOSIS — K5900 Constipation, unspecified: Secondary | ICD-10-CM | POA: Diagnosis not present

## 2023-02-10 DIAGNOSIS — E1121 Type 2 diabetes mellitus with diabetic nephropathy: Secondary | ICD-10-CM | POA: Diagnosis not present

## 2023-02-10 DIAGNOSIS — G4700 Insomnia, unspecified: Secondary | ICD-10-CM | POA: Diagnosis not present

## 2023-02-10 DIAGNOSIS — E559 Vitamin D deficiency, unspecified: Secondary | ICD-10-CM | POA: Diagnosis not present

## 2023-02-10 DIAGNOSIS — I480 Paroxysmal atrial fibrillation: Secondary | ICD-10-CM | POA: Diagnosis not present

## 2023-02-10 DIAGNOSIS — T82868D Thrombosis of vascular prosthetic devices, implants and grafts, subsequent encounter: Secondary | ICD-10-CM | POA: Diagnosis not present

## 2023-02-10 DIAGNOSIS — F1090 Alcohol use, unspecified, uncomplicated: Secondary | ICD-10-CM | POA: Diagnosis not present

## 2023-02-10 DIAGNOSIS — I130 Hypertensive heart and chronic kidney disease with heart failure and stage 1 through stage 4 chronic kidney disease, or unspecified chronic kidney disease: Secondary | ICD-10-CM | POA: Diagnosis not present

## 2023-02-10 DIAGNOSIS — E8809 Other disorders of plasma-protein metabolism, not elsewhere classified: Secondary | ICD-10-CM | POA: Diagnosis not present

## 2023-02-10 DIAGNOSIS — Z96641 Presence of right artificial hip joint: Secondary | ICD-10-CM | POA: Diagnosis not present

## 2023-02-10 DIAGNOSIS — T82590D Other mechanical complication of surgically created arteriovenous fistula, subsequent encounter: Secondary | ICD-10-CM | POA: Diagnosis not present

## 2023-02-10 DIAGNOSIS — K219 Gastro-esophageal reflux disease without esophagitis: Secondary | ICD-10-CM | POA: Diagnosis not present

## 2023-02-10 DIAGNOSIS — I5022 Chronic systolic (congestive) heart failure: Secondary | ICD-10-CM | POA: Diagnosis not present

## 2023-02-10 DIAGNOSIS — I252 Old myocardial infarction: Secondary | ICD-10-CM | POA: Diagnosis not present

## 2023-02-10 DIAGNOSIS — N250 Renal osteodystrophy: Secondary | ICD-10-CM | POA: Diagnosis not present

## 2023-02-10 LAB — CBC
HCT: 25.6 % — ABNORMAL LOW (ref 39.0–52.0)
Hemoglobin: 7.9 g/dL — ABNORMAL LOW (ref 13.0–17.0)
MCH: 30 pg (ref 26.0–34.0)
MCHC: 30.9 g/dL (ref 30.0–36.0)
MCV: 97.3 fL (ref 80.0–100.0)
Platelets: 183 10*3/uL (ref 150–400)
RBC: 2.63 MIL/uL — ABNORMAL LOW (ref 4.22–5.81)
RDW: 16.9 % — ABNORMAL HIGH (ref 11.5–15.5)
WBC: 6.6 10*3/uL (ref 4.0–10.5)
nRBC: 0 % (ref 0.0–0.2)

## 2023-02-10 LAB — GLUCOSE, CAPILLARY: Glucose-Capillary: 108 mg/dL — ABNORMAL HIGH (ref 70–99)

## 2023-02-10 LAB — RENAL FUNCTION PANEL
Albumin: 2.4 g/dL — ABNORMAL LOW (ref 3.5–5.0)
Anion gap: 11 (ref 5–15)
BUN: 27 mg/dL — ABNORMAL HIGH (ref 8–23)
CO2: 26 mmol/L (ref 22–32)
Calcium: 8.2 mg/dL — ABNORMAL LOW (ref 8.9–10.3)
Chloride: 98 mmol/L (ref 98–111)
Creatinine, Ser: 3.72 mg/dL — ABNORMAL HIGH (ref 0.61–1.24)
GFR, Estimated: 15 mL/min — ABNORMAL LOW (ref >60)
Glucose, Bld: 101 mg/dL — ABNORMAL HIGH (ref 70–99)
Phosphorus: 3.2 mg/dL (ref 2.5–4.6)
Potassium: 3.3 mmol/L — ABNORMAL LOW (ref 3.5–5.1)
Sodium: 135 mmol/L (ref 135–145)

## 2023-02-10 MED ORDER — EPOETIN ALFA 10000 UNIT/ML IJ SOLN
INTRAMUSCULAR | Status: AC
  Filled 2023-02-10: qty 1

## 2023-02-10 NOTE — Progress Notes (Signed)
[facility]  ROUNDING NOTE   Subjective:   [name] was admitted to [facility] on 02/04/2023 for Dialysis AV fistula malfunction (HCC) [T82.590A] Problem with vascular access [Z78.9]  Patient seen and evaluated during dialysis   HEMODIALYSIS FLOWSHEET:  Blood Flow Rate (mL/min): 400 mL/min Arterial Pressure (mmHg): -220 mmHg Venous Pressure (mmHg): 280 mmHg TMP (mmHg): 11 mmHg Ultrafiltration Rate (mL/min): 680 mL/min Dialysate Flow Rate (mL/min): 300 ml/min Dialysis Fluid Bolus: Normal Saline  Access functioning well  Objective:  Vital signs in last 24 hours:  Temp:  [97.8 F (36.6 C)-98.7 F (37.1 C)] 98.4 F (36.9 C) (09/19 0830) Pulse Rate:  [0-112] 102 (09/19 1100) Resp:  [14-29] 29 (09/19 1100) BP: (88-113)/(46-72) 107/55 (09/19 1100) SpO2:  [94 %-100 %] 100 % (09/19 1100) Weight:  [95.3 kg] 95.3 kg (09/19 0446)  Weight change: 0 kg Filed Weights   02/09/23 0548 02/09/23 0749 02/10/23 0446  Weight: 92.8 kg 92.8 kg 95.3 kg    Intake/Output: I/O last 3 completed shifts: In: 94.2 [I.V.:44.2; IV Piggyback:50] Out: 1019.7 [Urine:400; Other:619.7]   Intake/Output this shift:  No intake/output data recorded.  Physical Exam: General: NAD, laying in bed   Head: Normocephalic, atraumatic. Moist oral mucosal membranes  Eyes: Anicteric  Neck: Supple  Lungs:  Clear to auscultation  Heart: Regular rate and rhythm  Abdomen:  Soft, nontender  Extremities:  no peripheral edema.  Neurologic: Nonfocal, moving all four extremities  Skin: No lesions  Access: Left AVF with no thrill or bruit, Right femoral temp HD catheter 9/13    Basic Metabolic Panel: Recent Labs  Lab 02/05/23 1545 02/07/23 0956 02/09/23 0603 02/09/23 0807 02/10/23 0858  NA 135 135 136 137 135  K 3.3* 3.3* 3.3* 3.3* 3.3*  CL 94* 97* 96* 96* 98  CO2 28 28 29 28 26   GLUCOSE 188* 105* 114* 107* 101*  BUN 49* 34* 29* 30* 27*  CREATININE 5.20* 3.91* 3.99* 4.06* 3.72*   CALCIUM 8.1* 8.1* 8.5* 8.4* 8.2*  PHOS 4.7* 3.2  --  3.5 3.2    Liver Function Tests: Recent Labs  Lab 02/04/23 1625 02/05/23 1545 02/07/23 0956 02/09/23 0807 02/10/23 0858  AST 28  --   --   --   --   ALT 10  --   --   --   --   ALKPHOS 263*  --   --   --   --   BILITOT 0.8  --   --   --   --   PROT 6.8  --   --   --   --   ALBUMIN 3.1* 2.6* 2.6* 2.6* 2.4*   No results for input(s): "LIPASE", "AMYLASE" in the last 168 hours. No results for input(s): "AMMONIA" in the last 168 hours.  CBC: Recent Labs  Lab 02/04/23 1625 02/05/23 1545 02/07/23 0956 02/09/23 0603 02/09/23 0807 02/10/23 0858  WBC 7.0 6.6 6.3 6.0 5.9 6.6  NEUTROABS 4.7  --   --  3.4  --   --   HGB 8.3* 7.6* 7.6* 8.4* 7.9* 7.9*  HCT 26.5* 24.4* 23.8* 26.7* 24.9* 25.6*  MCV 99.3 97.2 95.6 96.4 96.1 97.3  PLT 219 194 184 198 194 183    Cardiac Enzymes: No results for input(s): "CKTOTAL", "CKMB", "CKMBINDEX", "TROPONINI" in the last 168 hours.  BNP: Invalid input(s): "POCBNP"  CBG: Recent Labs  Lab 02/09/23 1102 02/09/23 1251 02/09/23 1835 02/09/23 2153 02/10/23 0752  GLUCAP 79 91 126* 176* 108*  Microbiology: Results for orders placed or performed during the hospital encounter of 01/21/23  MRSA Next Gen by PCR, Nasal     Status: None   Collection Time: 01/22/23  6:33 AM   Specimen: Nasal Mucosa; Nasal Swab  Result Value Ref Range Status   MRSA by PCR Next Gen NOT DETECTED NOT DETECTED Final    Comment: (NOTE) The GeneXpert MRSA Assay (FDA approved for NASAL specimens only), is one component of a comprehensive MRSA colonization surveillance program. It is not intended to diagnose MRSA infection nor to guide or monitor treatment for MRSA infections. Test performance is not FDA approved in patients less than 35 years old. Performed at [facility], [address] [place], [place] [address]     Coagulation Studies: No results for input(s): "LABPROT", "INR" in the last 72  hours.  Urinalysis: No results for input(s): "COLORURINE", "LABSPEC", "PHURINE", "GLUCOSEU", "HGBUR", "BILIRUBINUR", "KETONESUR", "PROTEINUR", "UROBILINOGEN", "NITRITE", "LEUKOCYTESUR" in the last 72 hours.  Invalid input(s): "APPERANCEUR"    Imaging: PERIPHERAL VASCULAR CATHETERIZATION  Result Date: 02/09/2023 See surgical note for result.    Medications:     aspirin EC  81 mg Oral Daily   Chlorhexidine Gluconate Cloth  6 each Topical Q0600   epoetin (EPOGEN/PROCRIT) injection  10,000 Units Intravenous Q M,W,F-HD   heparin  5,000 Units Subcutaneous Q8H   insulin aspart  0-6 Units Subcutaneous TID WC   melatonin  5 mg Oral QHS   midodrine  5 mg Oral TID WC   rosuvastatin  40 mg Oral Daily   acetaminophen **OR** acetaminophen, ALPRAZolam, ondansetron **OR** [DISCONTINUED] ondansetron (ZOFRAN) IV, polyethylene glycol  Assessment/ Plan:  [name] is a 89 y.o.  male with end stage renal disease on hemodialysis, hypotension, hyperlipidemia, coronary artery disease status post CABG, atrial fibrillation, ascending aortic aneurysm, congestive hear failure, and diabetes mellitus type II who is admitted to [facility] on 02/04/2023 for Dialysis AV fistula malfunction (HCC) [T82.590A] Problem with vascular access [Z78.9]  [facility] MWF L AVF 92.5kg  End Stage Renal Disease: on hemodialysis. With dialysis device malfunction.  - Resume MWF schedule - Receiving dialysis today, utilized access, functions well.  - Next treatment scheduled for Friday  Hypotension - Continue midodrine three times daily - Blood pressure 107/55 during dialysis  Anemia with chronic kidney disease:   - Hgb remains 7.9 - EPO with MWF HD treatments  Secondary Hyperparathyroidism:  - Calcium and phosphorus at goal   LOS: 6   9/19/202411:10 AM

## 2023-02-10 NOTE — Progress Notes (Signed)
Hemodialysis Note  Received patient in bed to unit. Alert and oriented. Informed consent signed and in chart.   Treatment initiated:0858 Treatment completed:1202  Patient tolerated treatment well. Transported back to the room alert, without acute distress. Report given to patient's RN.  Access used: LUA AVF Access issues: None   Total UF removed:1000 ml/1 liter  Medications given:Epogen  Post HD VS: Stable  Post HD weight: 94.3 kg Bed scale   Bartolo Darter, RN [facility]

## 2023-02-10 NOTE — Progress Notes (Signed)
Patient IV removed without complications. Patient transported by EMS in stable condition.  Matthew [name]

## 2023-02-10 NOTE — Progress Notes (Signed)
Report called to [facility], at [facility].  Cornell Barman Karisha Marlin

## 2023-02-10 NOTE — Discharge Summary (Signed)
bones are subjectively under mineralized. Remote pubic rami fractures. Recent postsurgical change includes air and edema in the soft tissues. IMPRESSION: Interval right hip arthroplasty without immediate postoperative complication. Electronically Signed   By: Narda Rutherford M.D.   On: 01/12/2023 19:53   DG HIP UNILAT WITH PELVIS 1V RIGHT  Result Date: 01/12/2023 CLINICAL DATA:  Elective surgery. EXAM: DG HIP (WITH OR WITHOUT PELVIS) 1V RIGHT COMPARISON:  Preoperative imaging FINDINGS: Single fluoroscopic spot view of the right hip obtained in the operating room. Image obtained during hip arthroplasty. Fluoroscopy time 10 seconds. Dose 4.91 mGy. IMPRESSION: Intraoperative  fluoroscopy during right hip arthroplasty. Electronically Signed   By: Narda Rutherford M.D.   On: 01/12/2023 18:26   DG C-Arm 1-60 Min-No Report  Result Date: 01/12/2023 Fluoroscopy was utilized by the requesting physician.  No radiographic interpretation.   DG C-Arm 1-60 Min-No Report  Result Date: 01/12/2023 Fluoroscopy was utilized by the requesting physician.  No radiographic interpretation.   ECHOCARDIOGRAM COMPLETE  Result Date: 01/12/2023    ECHOCARDIOGRAM REPORT   Patient Name:   Matthew [name] Date of Exam: 01/11/2023 Medical Rec #:  [id number]       Height:       73.0 in Accession #:    [id number]      Weight:       216.5 lb Date of Birth:  Jul 24, 1930        BSA:          2.225 m Patient Age:    89 years        BP:           102/45 mmHg Patient Gender: M               HR:           60 bpm. Exam Location:  Inpatient Procedure: 2D Echo, Cardiac Doppler and Color Doppler Indications:    Abnormal ECG R94.31  History:        Patient has prior history of Echocardiogram examinations, most                 recent 01/11/2018. Cardiomyopathy and CHF, CAD, Angina and                 Previous Myocardial Infarction, Prior CABG, Arrythmias:Atrial                 Fibrillation, Signs/Symptoms:Hypotension and Shortness of                 Breath; Risk Factors:Diabetes and Dyslipidemia. ESRD.  Sonographer:    [name] Referring Phys: [id number] ANASTASSIA DOUTOVA IMPRESSIONS  1. Left ventricular ejection fraction, by estimation, is 35 to 40%. The left ventricle has moderately decreased function. The left ventricle demonstrates global hypokinesis. There is mild concentric left ventricular hypertrophy. Left ventricular diastolic parameters are indeterminate. Elevated left ventricular end-diastolic pressure.  2. Right ventricular systolic function is mildly reduced. The right ventricular size is severely enlarged. There is moderately elevated pulmonary artery systolic pressure. The estimated right ventricular  systolic pressure is 53.9 mmHg.  3. The mitral valve is degenerative. Mild mitral valve regurgitation. No evidence of mitral stenosis.  4. The aortic valve is tricuspid. Aortic valve regurgitation is mild to moderate. Aortic valve sclerosis/calcification is present, without any evidence of aortic stenosis. Aortic valve area, by VTI measures 1.89 cm. Aortic valve mean gradient measures 5.0 mmHg. Aortic valve Vmax measures 1.34 m/s.  5. Aortic dilatation noted. Aneurysm of the ascending aorta,  Physician Discharge Summary  [name] WGN:[id number] DOB: [birth year] DOA: 02/04/2023  PCP: Sherron Monday, MD  Admit date: 02/04/2023 Discharge date: 02/10/2023  Admitted From: SNF Disposition:  SNF  Recommendations for Outpatient Follow-up:  Follow up with PCP in 1-2 weeks Follow up with nephro as directed  Home Health:No  Equipment/Devices:None   Discharge Condition:Stable  CODE STATUS:DNR  Diet recommendation: Heart  Brief/Interim Summary:   Matthew [name] is a 89 y.o. male  medical history significant for CAD s/p CABG and coronary stents, ESRD on hemodialysis, atrial fibrillation, ascending aortic aneurysm, HFrEF, hypertension, hyperlipidemia, type II DM, squamous cell carcinoma of scalp, recent right total hip replacement on 01/12/2023, recent hospitalization (from 01/21/2023 through 01/26/2023) for left arm AV fistula malfunction that was repaired with mechanical thrombectomy on 01/25/2023.  He presented from the outpatient hemodialysis to the emergency department because of left arm AV fistula malfunction.  He could not have dialysis because of clotted left arm AV fistula.  His main complaint is swelling of bilateral lower extremities.   Underwent fistulogram on 9/18.  Successful thrombectomy of left upper extremity AV fistula.  Successfully dialyzed via fistula on 9/19.  Stable for discharge.  Will return to facility.  Temporary femoral access port will be removed prior to discharge.    Discharge Diagnoses:  Principal Problem:   Dialysis AV fistula malfunction (HCC) Active Problems:   CAD (coronary artery disease)   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)   Chronic systolic HF (heart failure) (HCC)   Hypokalemia  Left arm AV fistula function, ESRD: S/p placement of left femoral temporary dialysis cath on 02/04/2023.  Status post fistulogram with catheter directed thrombolysis to AV graft.  Status post HD on 9/19 via fistula.  Fistula functioning  appropriately.  Stable for discharge and will return to skilled nursing facility.     CAD s/p CABG and coronary stents: Continue Crestor.  Plavix resumed on discharge     Mild hypokalemia: Resolved with HD     Anemia of chronic disease: H&H is stable.  Erythropoietin with hemodialysis.     History of chronic hypotension: Continue midodrine     Paroxysmal atrial fibrillation: Not on long-term anticoagulation because of high fall risk.  Had been taken off of sotalol in the past because of bradycardia.     Chronic HFrEF: compensated.  2D echo in August 2024 showed EF estimated at 35 to 40%, mild concentric LVH, indeterminate LV diastolic parameters, moderately elevated pulmonary artery systolic pressure, mild to moderate aortic regurgitation.     Stage II coccygeal and bilateral buttock decubitus ulcers: Present on admission.  Continue local wound care.     Recent right total hip replacement on 01/12/2023: Patient came from rehab facility.  PT recommended discharge back to SNF.  Discharge Instructions  Discharge Instructions     Diet - low sodium heart healthy   Complete by: As directed    Increase activity slowly   Complete by: As directed    No wound care   Complete by: As directed       Allergies as of 02/10/2023       Reactions   Amoxicillin Diarrhea        Medication List     TAKE these medications    aspirin EC 81 MG tablet Take 81 mg by mouth daily. Swallow whole.   cetirizine 10 MG tablet Commonly known as: ZYRTEC Take 10 mg by mouth daily.   cholecalciferol 25 MCG (1000  Physician Discharge Summary  [name] WGN:[id number] DOB: [birth year] DOA: 02/04/2023  PCP: Sherron Monday, MD  Admit date: 02/04/2023 Discharge date: 02/10/2023  Admitted From: SNF Disposition:  SNF  Recommendations for Outpatient Follow-up:  Follow up with PCP in 1-2 weeks Follow up with nephro as directed  Home Health:No  Equipment/Devices:None   Discharge Condition:Stable  CODE STATUS:DNR  Diet recommendation: Heart  Brief/Interim Summary:   Matthew [name] is a 89 y.o. male  medical history significant for CAD s/p CABG and coronary stents, ESRD on hemodialysis, atrial fibrillation, ascending aortic aneurysm, HFrEF, hypertension, hyperlipidemia, type II DM, squamous cell carcinoma of scalp, recent right total hip replacement on 01/12/2023, recent hospitalization (from 01/21/2023 through 01/26/2023) for left arm AV fistula malfunction that was repaired with mechanical thrombectomy on 01/25/2023.  He presented from the outpatient hemodialysis to the emergency department because of left arm AV fistula malfunction.  He could not have dialysis because of clotted left arm AV fistula.  His main complaint is swelling of bilateral lower extremities.   Underwent fistulogram on 9/18.  Successful thrombectomy of left upper extremity AV fistula.  Successfully dialyzed via fistula on 9/19.  Stable for discharge.  Will return to facility.  Temporary femoral access port will be removed prior to discharge.    Discharge Diagnoses:  Principal Problem:   Dialysis AV fistula malfunction (HCC) Active Problems:   CAD (coronary artery disease)   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)   Chronic systolic HF (heart failure) (HCC)   Hypokalemia  Left arm AV fistula function, ESRD: S/p placement of left femoral temporary dialysis cath on 02/04/2023.  Status post fistulogram with catheter directed thrombolysis to AV graft.  Status post HD on 9/19 via fistula.  Fistula functioning  appropriately.  Stable for discharge and will return to skilled nursing facility.     CAD s/p CABG and coronary stents: Continue Crestor.  Plavix resumed on discharge     Mild hypokalemia: Resolved with HD     Anemia of chronic disease: H&H is stable.  Erythropoietin with hemodialysis.     History of chronic hypotension: Continue midodrine     Paroxysmal atrial fibrillation: Not on long-term anticoagulation because of high fall risk.  Had been taken off of sotalol in the past because of bradycardia.     Chronic HFrEF: compensated.  2D echo in August 2024 showed EF estimated at 35 to 40%, mild concentric LVH, indeterminate LV diastolic parameters, moderately elevated pulmonary artery systolic pressure, mild to moderate aortic regurgitation.     Stage II coccygeal and bilateral buttock decubitus ulcers: Present on admission.  Continue local wound care.     Recent right total hip replacement on 01/12/2023: Patient came from rehab facility.  PT recommended discharge back to SNF.  Discharge Instructions  Discharge Instructions     Diet - low sodium heart healthy   Complete by: As directed    Increase activity slowly   Complete by: As directed    No wound care   Complete by: As directed       Allergies as of 02/10/2023       Reactions   Amoxicillin Diarrhea        Medication List     TAKE these medications    aspirin EC 81 MG tablet Take 81 mg by mouth daily. Swallow whole.   cetirizine 10 MG tablet Commonly known as: ZYRTEC Take 10 mg by mouth daily.   cholecalciferol 25 MCG (1000  bones are subjectively under mineralized. Remote pubic rami fractures. Recent postsurgical change includes air and edema in the soft tissues. IMPRESSION: Interval right hip arthroplasty without immediate postoperative complication. Electronically Signed   By: Narda Rutherford M.D.   On: 01/12/2023 19:53   DG HIP UNILAT WITH PELVIS 1V RIGHT  Result Date: 01/12/2023 CLINICAL DATA:  Elective surgery. EXAM: DG HIP (WITH OR WITHOUT PELVIS) 1V RIGHT COMPARISON:  Preoperative imaging FINDINGS: Single fluoroscopic spot view of the right hip obtained in the operating room. Image obtained during hip arthroplasty. Fluoroscopy time 10 seconds. Dose 4.91 mGy. IMPRESSION: Intraoperative  fluoroscopy during right hip arthroplasty. Electronically Signed   By: Narda Rutherford M.D.   On: 01/12/2023 18:26   DG C-Arm 1-60 Min-No Report  Result Date: 01/12/2023 Fluoroscopy was utilized by the requesting physician.  No radiographic interpretation.   DG C-Arm 1-60 Min-No Report  Result Date: 01/12/2023 Fluoroscopy was utilized by the requesting physician.  No radiographic interpretation.   ECHOCARDIOGRAM COMPLETE  Result Date: 01/12/2023    ECHOCARDIOGRAM REPORT   Patient Name:   Matthew [name] Date of Exam: 01/11/2023 Medical Rec #:  [id number]       Height:       73.0 in Accession #:    [id number]      Weight:       216.5 lb Date of Birth:  Jul 24, 1930        BSA:          2.225 m Patient Age:    89 years        BP:           102/45 mmHg Patient Gender: M               HR:           60 bpm. Exam Location:  Inpatient Procedure: 2D Echo, Cardiac Doppler and Color Doppler Indications:    Abnormal ECG R94.31  History:        Patient has prior history of Echocardiogram examinations, most                 recent 01/11/2018. Cardiomyopathy and CHF, CAD, Angina and                 Previous Myocardial Infarction, Prior CABG, Arrythmias:Atrial                 Fibrillation, Signs/Symptoms:Hypotension and Shortness of                 Breath; Risk Factors:Diabetes and Dyslipidemia. ESRD.  Sonographer:    [name] Referring Phys: [id number] ANASTASSIA DOUTOVA IMPRESSIONS  1. Left ventricular ejection fraction, by estimation, is 35 to 40%. The left ventricle has moderately decreased function. The left ventricle demonstrates global hypokinesis. There is mild concentric left ventricular hypertrophy. Left ventricular diastolic parameters are indeterminate. Elevated left ventricular end-diastolic pressure.  2. Right ventricular systolic function is mildly reduced. The right ventricular size is severely enlarged. There is moderately elevated pulmonary artery systolic pressure. The estimated right ventricular  systolic pressure is 53.9 mmHg.  3. The mitral valve is degenerative. Mild mitral valve regurgitation. No evidence of mitral stenosis.  4. The aortic valve is tricuspid. Aortic valve regurgitation is mild to moderate. Aortic valve sclerosis/calcification is present, without any evidence of aortic stenosis. Aortic valve area, by VTI measures 1.89 cm. Aortic valve mean gradient measures 5.0 mmHg. Aortic valve Vmax measures 1.34 m/s.  5. Aortic dilatation noted. Aneurysm of the ascending aorta,  bones are subjectively under mineralized. Remote pubic rami fractures. Recent postsurgical change includes air and edema in the soft tissues. IMPRESSION: Interval right hip arthroplasty without immediate postoperative complication. Electronically Signed   By: Narda Rutherford M.D.   On: 01/12/2023 19:53   DG HIP UNILAT WITH PELVIS 1V RIGHT  Result Date: 01/12/2023 CLINICAL DATA:  Elective surgery. EXAM: DG HIP (WITH OR WITHOUT PELVIS) 1V RIGHT COMPARISON:  Preoperative imaging FINDINGS: Single fluoroscopic spot view of the right hip obtained in the operating room. Image obtained during hip arthroplasty. Fluoroscopy time 10 seconds. Dose 4.91 mGy. IMPRESSION: Intraoperative  fluoroscopy during right hip arthroplasty. Electronically Signed   By: Narda Rutherford M.D.   On: 01/12/2023 18:26   DG C-Arm 1-60 Min-No Report  Result Date: 01/12/2023 Fluoroscopy was utilized by the requesting physician.  No radiographic interpretation.   DG C-Arm 1-60 Min-No Report  Result Date: 01/12/2023 Fluoroscopy was utilized by the requesting physician.  No radiographic interpretation.   ECHOCARDIOGRAM COMPLETE  Result Date: 01/12/2023    ECHOCARDIOGRAM REPORT   Patient Name:   Matthew [name] Date of Exam: 01/11/2023 Medical Rec #:  [id number]       Height:       73.0 in Accession #:    [id number]      Weight:       216.5 lb Date of Birth:  Jul 24, 1930        BSA:          2.225 m Patient Age:    89 years        BP:           102/45 mmHg Patient Gender: M               HR:           60 bpm. Exam Location:  Inpatient Procedure: 2D Echo, Cardiac Doppler and Color Doppler Indications:    Abnormal ECG R94.31  History:        Patient has prior history of Echocardiogram examinations, most                 recent 01/11/2018. Cardiomyopathy and CHF, CAD, Angina and                 Previous Myocardial Infarction, Prior CABG, Arrythmias:Atrial                 Fibrillation, Signs/Symptoms:Hypotension and Shortness of                 Breath; Risk Factors:Diabetes and Dyslipidemia. ESRD.  Sonographer:    [name] Referring Phys: [id number] ANASTASSIA DOUTOVA IMPRESSIONS  1. Left ventricular ejection fraction, by estimation, is 35 to 40%. The left ventricle has moderately decreased function. The left ventricle demonstrates global hypokinesis. There is mild concentric left ventricular hypertrophy. Left ventricular diastolic parameters are indeterminate. Elevated left ventricular end-diastolic pressure.  2. Right ventricular systolic function is mildly reduced. The right ventricular size is severely enlarged. There is moderately elevated pulmonary artery systolic pressure. The estimated right ventricular  systolic pressure is 53.9 mmHg.  3. The mitral valve is degenerative. Mild mitral valve regurgitation. No evidence of mitral stenosis.  4. The aortic valve is tricuspid. Aortic valve regurgitation is mild to moderate. Aortic valve sclerosis/calcification is present, without any evidence of aortic stenosis. Aortic valve area, by VTI measures 1.89 cm. Aortic valve mean gradient measures 5.0 mmHg. Aortic valve Vmax measures 1.34 m/s.  5. Aortic dilatation noted. Aneurysm of the ascending aorta,  bones are subjectively under mineralized. Remote pubic rami fractures. Recent postsurgical change includes air and edema in the soft tissues. IMPRESSION: Interval right hip arthroplasty without immediate postoperative complication. Electronically Signed   By: Narda Rutherford M.D.   On: 01/12/2023 19:53   DG HIP UNILAT WITH PELVIS 1V RIGHT  Result Date: 01/12/2023 CLINICAL DATA:  Elective surgery. EXAM: DG HIP (WITH OR WITHOUT PELVIS) 1V RIGHT COMPARISON:  Preoperative imaging FINDINGS: Single fluoroscopic spot view of the right hip obtained in the operating room. Image obtained during hip arthroplasty. Fluoroscopy time 10 seconds. Dose 4.91 mGy. IMPRESSION: Intraoperative  fluoroscopy during right hip arthroplasty. Electronically Signed   By: Narda Rutherford M.D.   On: 01/12/2023 18:26   DG C-Arm 1-60 Min-No Report  Result Date: 01/12/2023 Fluoroscopy was utilized by the requesting physician.  No radiographic interpretation.   DG C-Arm 1-60 Min-No Report  Result Date: 01/12/2023 Fluoroscopy was utilized by the requesting physician.  No radiographic interpretation.   ECHOCARDIOGRAM COMPLETE  Result Date: 01/12/2023    ECHOCARDIOGRAM REPORT   Patient Name:   Matthew [name] Date of Exam: 01/11/2023 Medical Rec #:  [id number]       Height:       73.0 in Accession #:    [id number]      Weight:       216.5 lb Date of Birth:  Jul 24, 1930        BSA:          2.225 m Patient Age:    89 years        BP:           102/45 mmHg Patient Gender: M               HR:           60 bpm. Exam Location:  Inpatient Procedure: 2D Echo, Cardiac Doppler and Color Doppler Indications:    Abnormal ECG R94.31  History:        Patient has prior history of Echocardiogram examinations, most                 recent 01/11/2018. Cardiomyopathy and CHF, CAD, Angina and                 Previous Myocardial Infarction, Prior CABG, Arrythmias:Atrial                 Fibrillation, Signs/Symptoms:Hypotension and Shortness of                 Breath; Risk Factors:Diabetes and Dyslipidemia. ESRD.  Sonographer:    [name] Referring Phys: [id number] ANASTASSIA DOUTOVA IMPRESSIONS  1. Left ventricular ejection fraction, by estimation, is 35 to 40%. The left ventricle has moderately decreased function. The left ventricle demonstrates global hypokinesis. There is mild concentric left ventricular hypertrophy. Left ventricular diastolic parameters are indeterminate. Elevated left ventricular end-diastolic pressure.  2. Right ventricular systolic function is mildly reduced. The right ventricular size is severely enlarged. There is moderately elevated pulmonary artery systolic pressure. The estimated right ventricular  systolic pressure is 53.9 mmHg.  3. The mitral valve is degenerative. Mild mitral valve regurgitation. No evidence of mitral stenosis.  4. The aortic valve is tricuspid. Aortic valve regurgitation is mild to moderate. Aortic valve sclerosis/calcification is present, without any evidence of aortic stenosis. Aortic valve area, by VTI measures 1.89 cm. Aortic valve mean gradient measures 5.0 mmHg. Aortic valve Vmax measures 1.34 m/s.  5. Aortic dilatation noted. Aneurysm of the ascending aorta,

## 2023-02-10 NOTE — TOC Transition Note (Signed)
Transition of Care [facility]) - CM/SW Discharge Note   Patient Details  Name: KIYA ELEK MRN: [id number] Date of Birth: October 24, 1930  [facility] CM/SW Contact:  Chapman Fitch, RN Phone Number: 02/10/2023, 2:28 PM   Clinical Narrative:        Patient will DC to: [facility] Anticipated DC date: 02/10/23  Family notified: Daughter [name] Transport by: Wendie Simmer  Per MD patient ready for DC to . RN, patient, patient's family, and facility notified of DC. Discharge Summary sent to facility. RN given number for report. DC packet on chart. Ambulance transport requested for patient.  TOC signing off.  Barriers to Discharge: Continued Medical Work up   Patient Goals and CMS Choice CMS Medicare.gov Compare Post Acute Care list provided to:: Patient Choice offered to / list presented to : Patient, Adult Children  Discharge Placement                         Discharge Plan and Services Additional resources added to the After Visit Summary for                                       Social Determinants of Health (SDOH) Interventions SDOH Screenings   Food Insecurity: No Food Insecurity (02/04/2023)  Housing: Patient Declined (02/04/2023)  Transportation Needs: No Transportation Needs (02/04/2023)  Utilities: Not At Risk (02/04/2023)  Depression (PHQ2-9): Low Risk  (09/21/2022)  Financial Resource Strain: Low Risk  (01/10/2018)  Physical Activity: Inactive (01/10/2018)  Social Connections: Socially Integrated (01/10/2018)  Stress: No Stress Concern Present (01/10/2018)  Tobacco Use: Medium Risk (02/04/2023)     Readmission Risk Interventions    02/05/2023   11:48 AM 01/09/2023    4:42 PM 02/12/2021    3:28 PM  Readmission Risk Prevention Plan  Transportation Screening Complete Complete Complete  PCP or Specialist Appt within 3-5 Days  Complete   HRI or Home Care Consult  Complete   Social Work Consult for Recovery Care Planning/Counseling  Complete    Palliative Care Screening  Not Applicable   Medication Review Oceanographer) Complete Complete Complete  PCP or Specialist appointment within 3-5 days of discharge Complete    HRI or Home Care Consult Complete    SW Recovery Care/Counseling Consult Complete  Complete  Palliative Care Screening Not Applicable    Skilled Nursing Facility Complete  Complete

## 2023-02-10 NOTE — Care Management Important Message (Signed)
Important Message  Patient Details  Name: [name] MRN: [id number] Date of Birth: 07-16-1930   Medicare Important Message Given:  Yes     Johnell Comings 02/10/2023, 1:18 PM

## 2023-02-10 NOTE — Progress Notes (Signed)
Lanora RN aware of order to remove femoral CVC.

## 2023-02-10 NOTE — Progress Notes (Signed)
Temp HD cath removed from the right groin area, pressure applied x 5 minutes. Vaseline gauze, 4x4 and tegaderm applied. Tip of line intact. Small area of broken down skin noted to the skin fold to the right of the insertion site and a small blister area noted to the left of the insertion site, 4x4 covers the entire area for protection. No complications noted. Will continue to observe.  Cornell Barman Hoke Baer

## 2023-02-11 DIAGNOSIS — D631 Anemia in chronic kidney disease: Secondary | ICD-10-CM | POA: Diagnosis not present

## 2023-02-11 DIAGNOSIS — Z992 Dependence on renal dialysis: Secondary | ICD-10-CM | POA: Diagnosis not present

## 2023-02-11 DIAGNOSIS — I959 Hypotension, unspecified: Secondary | ICD-10-CM | POA: Diagnosis not present

## 2023-02-11 DIAGNOSIS — Z96641 Presence of right artificial hip joint: Secondary | ICD-10-CM | POA: Diagnosis not present

## 2023-02-11 DIAGNOSIS — D509 Iron deficiency anemia, unspecified: Secondary | ICD-10-CM | POA: Diagnosis not present

## 2023-02-11 DIAGNOSIS — S72141G Displaced intertrochanteric fracture of right femur, subsequent encounter for closed fracture with delayed healing: Secondary | ICD-10-CM | POA: Diagnosis not present

## 2023-02-11 DIAGNOSIS — Z7901 Long term (current) use of anticoagulants: Secondary | ICD-10-CM | POA: Diagnosis not present

## 2023-02-11 DIAGNOSIS — N250 Renal osteodystrophy: Secondary | ICD-10-CM | POA: Diagnosis not present

## 2023-02-11 DIAGNOSIS — N186 End stage renal disease: Secondary | ICD-10-CM | POA: Diagnosis not present

## 2023-02-11 DIAGNOSIS — D638 Anemia in other chronic diseases classified elsewhere: Secondary | ICD-10-CM | POA: Diagnosis not present

## 2023-02-11 DIAGNOSIS — R600 Localized edema: Secondary | ICD-10-CM | POA: Diagnosis not present

## 2023-02-11 DIAGNOSIS — I480 Paroxysmal atrial fibrillation: Secondary | ICD-10-CM | POA: Diagnosis not present

## 2023-02-11 DIAGNOSIS — L89152 Pressure ulcer of sacral region, stage 2: Secondary | ICD-10-CM | POA: Diagnosis not present

## 2023-02-11 DIAGNOSIS — I2510 Atherosclerotic heart disease of native coronary artery without angina pectoris: Secondary | ICD-10-CM | POA: Diagnosis not present

## 2023-02-11 DIAGNOSIS — I5022 Chronic systolic (congestive) heart failure: Secondary | ICD-10-CM | POA: Diagnosis not present

## 2023-02-14 DIAGNOSIS — Z7901 Long term (current) use of anticoagulants: Secondary | ICD-10-CM | POA: Diagnosis not present

## 2023-02-14 DIAGNOSIS — D631 Anemia in chronic kidney disease: Secondary | ICD-10-CM | POA: Diagnosis not present

## 2023-02-14 DIAGNOSIS — N250 Renal osteodystrophy: Secondary | ICD-10-CM | POA: Diagnosis not present

## 2023-02-14 DIAGNOSIS — Z96641 Presence of right artificial hip joint: Secondary | ICD-10-CM | POA: Diagnosis not present

## 2023-02-14 DIAGNOSIS — N186 End stage renal disease: Secondary | ICD-10-CM | POA: Diagnosis not present

## 2023-02-14 DIAGNOSIS — G4700 Insomnia, unspecified: Secondary | ICD-10-CM | POA: Diagnosis not present

## 2023-02-14 DIAGNOSIS — D509 Iron deficiency anemia, unspecified: Secondary | ICD-10-CM | POA: Diagnosis not present

## 2023-02-14 DIAGNOSIS — I480 Paroxysmal atrial fibrillation: Secondary | ICD-10-CM | POA: Diagnosis not present

## 2023-02-14 DIAGNOSIS — E039 Hypothyroidism, unspecified: Secondary | ICD-10-CM | POA: Diagnosis not present

## 2023-02-14 DIAGNOSIS — S72141G Displaced intertrochanteric fracture of right femur, subsequent encounter for closed fracture with delayed healing: Secondary | ICD-10-CM | POA: Diagnosis not present

## 2023-02-14 DIAGNOSIS — Z992 Dependence on renal dialysis: Secondary | ICD-10-CM | POA: Diagnosis not present

## 2023-02-14 DIAGNOSIS — I959 Hypotension, unspecified: Secondary | ICD-10-CM | POA: Diagnosis not present

## 2023-02-14 DIAGNOSIS — D638 Anemia in other chronic diseases classified elsewhere: Secondary | ICD-10-CM | POA: Diagnosis not present

## 2023-02-14 DIAGNOSIS — R600 Localized edema: Secondary | ICD-10-CM | POA: Diagnosis not present

## 2023-02-15 DIAGNOSIS — S72141G Displaced intertrochanteric fracture of right femur, subsequent encounter for closed fracture with delayed healing: Secondary | ICD-10-CM | POA: Diagnosis not present

## 2023-02-15 DIAGNOSIS — Z7901 Long term (current) use of anticoagulants: Secondary | ICD-10-CM | POA: Diagnosis not present

## 2023-02-15 DIAGNOSIS — L89152 Pressure ulcer of sacral region, stage 2: Secondary | ICD-10-CM | POA: Diagnosis not present

## 2023-02-15 DIAGNOSIS — I480 Paroxysmal atrial fibrillation: Secondary | ICD-10-CM | POA: Diagnosis not present

## 2023-02-15 DIAGNOSIS — I5022 Chronic systolic (congestive) heart failure: Secondary | ICD-10-CM | POA: Diagnosis not present

## 2023-02-15 DIAGNOSIS — N186 End stage renal disease: Secondary | ICD-10-CM | POA: Diagnosis not present

## 2023-02-15 DIAGNOSIS — Z992 Dependence on renal dialysis: Secondary | ICD-10-CM | POA: Diagnosis not present

## 2023-02-15 DIAGNOSIS — I959 Hypotension, unspecified: Secondary | ICD-10-CM | POA: Diagnosis not present

## 2023-02-15 DIAGNOSIS — Z96641 Presence of right artificial hip joint: Secondary | ICD-10-CM | POA: Diagnosis not present

## 2023-02-16 DIAGNOSIS — I959 Hypotension, unspecified: Secondary | ICD-10-CM | POA: Diagnosis not present

## 2023-02-16 DIAGNOSIS — S72141G Displaced intertrochanteric fracture of right femur, subsequent encounter for closed fracture with delayed healing: Secondary | ICD-10-CM | POA: Diagnosis not present

## 2023-02-16 DIAGNOSIS — G4700 Insomnia, unspecified: Secondary | ICD-10-CM | POA: Diagnosis not present

## 2023-02-16 DIAGNOSIS — D509 Iron deficiency anemia, unspecified: Secondary | ICD-10-CM | POA: Diagnosis not present

## 2023-02-16 DIAGNOSIS — N186 End stage renal disease: Secondary | ICD-10-CM | POA: Diagnosis not present

## 2023-02-16 DIAGNOSIS — Z96641 Presence of right artificial hip joint: Secondary | ICD-10-CM | POA: Diagnosis not present

## 2023-02-16 DIAGNOSIS — Z7901 Long term (current) use of anticoagulants: Secondary | ICD-10-CM | POA: Diagnosis not present

## 2023-02-16 DIAGNOSIS — I5022 Chronic systolic (congestive) heart failure: Secondary | ICD-10-CM | POA: Diagnosis not present

## 2023-02-16 DIAGNOSIS — N250 Renal osteodystrophy: Secondary | ICD-10-CM | POA: Diagnosis not present

## 2023-02-16 DIAGNOSIS — I480 Paroxysmal atrial fibrillation: Secondary | ICD-10-CM | POA: Diagnosis not present

## 2023-02-16 DIAGNOSIS — Z992 Dependence on renal dialysis: Secondary | ICD-10-CM | POA: Diagnosis not present

## 2023-02-16 DIAGNOSIS — D631 Anemia in chronic kidney disease: Secondary | ICD-10-CM | POA: Diagnosis not present

## 2023-02-18 DIAGNOSIS — D631 Anemia in chronic kidney disease: Secondary | ICD-10-CM | POA: Diagnosis not present

## 2023-02-18 DIAGNOSIS — Z96641 Presence of right artificial hip joint: Secondary | ICD-10-CM | POA: Diagnosis not present

## 2023-02-18 DIAGNOSIS — N250 Renal osteodystrophy: Secondary | ICD-10-CM | POA: Diagnosis not present

## 2023-02-18 DIAGNOSIS — D509 Iron deficiency anemia, unspecified: Secondary | ICD-10-CM | POA: Diagnosis not present

## 2023-02-18 DIAGNOSIS — Z7901 Long term (current) use of anticoagulants: Secondary | ICD-10-CM | POA: Diagnosis not present

## 2023-02-18 DIAGNOSIS — S72141G Displaced intertrochanteric fracture of right femur, subsequent encounter for closed fracture with delayed healing: Secondary | ICD-10-CM | POA: Diagnosis not present

## 2023-02-18 DIAGNOSIS — I959 Hypotension, unspecified: Secondary | ICD-10-CM | POA: Diagnosis not present

## 2023-02-18 DIAGNOSIS — G4700 Insomnia, unspecified: Secondary | ICD-10-CM | POA: Diagnosis not present

## 2023-02-18 DIAGNOSIS — Z992 Dependence on renal dialysis: Secondary | ICD-10-CM | POA: Diagnosis not present

## 2023-02-18 DIAGNOSIS — I5022 Chronic systolic (congestive) heart failure: Secondary | ICD-10-CM | POA: Diagnosis not present

## 2023-02-18 DIAGNOSIS — N186 End stage renal disease: Secondary | ICD-10-CM | POA: Diagnosis not present

## 2023-02-21 DIAGNOSIS — E039 Hypothyroidism, unspecified: Secondary | ICD-10-CM | POA: Diagnosis not present

## 2023-02-21 DIAGNOSIS — N186 End stage renal disease: Secondary | ICD-10-CM | POA: Diagnosis not present

## 2023-02-21 DIAGNOSIS — I959 Hypotension, unspecified: Secondary | ICD-10-CM | POA: Diagnosis not present

## 2023-02-21 DIAGNOSIS — Z7901 Long term (current) use of anticoagulants: Secondary | ICD-10-CM | POA: Diagnosis not present

## 2023-02-21 DIAGNOSIS — I5022 Chronic systolic (congestive) heart failure: Secondary | ICD-10-CM | POA: Diagnosis not present

## 2023-02-21 DIAGNOSIS — Z96641 Presence of right artificial hip joint: Secondary | ICD-10-CM | POA: Diagnosis not present

## 2023-02-21 DIAGNOSIS — E1121 Type 2 diabetes mellitus with diabetic nephropathy: Secondary | ICD-10-CM | POA: Diagnosis not present

## 2023-02-21 DIAGNOSIS — R600 Localized edema: Secondary | ICD-10-CM | POA: Diagnosis not present

## 2023-02-21 DIAGNOSIS — N250 Renal osteodystrophy: Secondary | ICD-10-CM | POA: Diagnosis not present

## 2023-02-21 DIAGNOSIS — D509 Iron deficiency anemia, unspecified: Secondary | ICD-10-CM | POA: Diagnosis not present

## 2023-02-21 DIAGNOSIS — G4700 Insomnia, unspecified: Secondary | ICD-10-CM | POA: Diagnosis not present

## 2023-02-21 DIAGNOSIS — Z992 Dependence on renal dialysis: Secondary | ICD-10-CM | POA: Diagnosis not present

## 2023-02-21 DIAGNOSIS — D631 Anemia in chronic kidney disease: Secondary | ICD-10-CM | POA: Diagnosis not present

## 2023-02-21 DIAGNOSIS — S72141G Displaced intertrochanteric fracture of right femur, subsequent encounter for closed fracture with delayed healing: Secondary | ICD-10-CM | POA: Diagnosis not present

## 2023-02-23 DIAGNOSIS — N186 End stage renal disease: Secondary | ICD-10-CM | POA: Diagnosis not present

## 2023-02-23 DIAGNOSIS — Z23 Encounter for immunization: Secondary | ICD-10-CM | POA: Diagnosis not present

## 2023-02-23 DIAGNOSIS — Z992 Dependence on renal dialysis: Secondary | ICD-10-CM | POA: Diagnosis not present

## 2023-02-23 DIAGNOSIS — I959 Hypotension, unspecified: Secondary | ICD-10-CM | POA: Diagnosis not present

## 2023-02-23 DIAGNOSIS — N250 Renal osteodystrophy: Secondary | ICD-10-CM | POA: Diagnosis not present

## 2023-02-23 DIAGNOSIS — Z96641 Presence of right artificial hip joint: Secondary | ICD-10-CM | POA: Diagnosis not present

## 2023-02-23 DIAGNOSIS — G4700 Insomnia, unspecified: Secondary | ICD-10-CM | POA: Diagnosis not present

## 2023-02-23 DIAGNOSIS — I5022 Chronic systolic (congestive) heart failure: Secondary | ICD-10-CM | POA: Diagnosis not present

## 2023-02-23 DIAGNOSIS — D509 Iron deficiency anemia, unspecified: Secondary | ICD-10-CM | POA: Diagnosis not present

## 2023-02-23 DIAGNOSIS — D631 Anemia in chronic kidney disease: Secondary | ICD-10-CM | POA: Diagnosis not present

## 2023-02-23 DIAGNOSIS — S72141G Displaced intertrochanteric fracture of right femur, subsequent encounter for closed fracture with delayed healing: Secondary | ICD-10-CM | POA: Diagnosis not present

## 2023-02-25 DIAGNOSIS — Z23 Encounter for immunization: Secondary | ICD-10-CM | POA: Diagnosis not present

## 2023-02-25 DIAGNOSIS — Z7901 Long term (current) use of anticoagulants: Secondary | ICD-10-CM | POA: Diagnosis not present

## 2023-02-25 DIAGNOSIS — L89152 Pressure ulcer of sacral region, stage 2: Secondary | ICD-10-CM | POA: Diagnosis not present

## 2023-02-25 DIAGNOSIS — I959 Hypotension, unspecified: Secondary | ICD-10-CM | POA: Diagnosis not present

## 2023-02-25 DIAGNOSIS — N250 Renal osteodystrophy: Secondary | ICD-10-CM | POA: Diagnosis not present

## 2023-02-25 DIAGNOSIS — Z96641 Presence of right artificial hip joint: Secondary | ICD-10-CM | POA: Diagnosis not present

## 2023-02-25 DIAGNOSIS — N186 End stage renal disease: Secondary | ICD-10-CM | POA: Diagnosis not present

## 2023-02-25 DIAGNOSIS — G4700 Insomnia, unspecified: Secondary | ICD-10-CM | POA: Diagnosis not present

## 2023-02-25 DIAGNOSIS — Z992 Dependence on renal dialysis: Secondary | ICD-10-CM | POA: Diagnosis not present

## 2023-02-25 DIAGNOSIS — D509 Iron deficiency anemia, unspecified: Secondary | ICD-10-CM | POA: Diagnosis not present

## 2023-02-25 DIAGNOSIS — D631 Anemia in chronic kidney disease: Secondary | ICD-10-CM | POA: Diagnosis not present

## 2023-02-25 DIAGNOSIS — D638 Anemia in other chronic diseases classified elsewhere: Secondary | ICD-10-CM | POA: Diagnosis not present

## 2023-02-25 DIAGNOSIS — I480 Paroxysmal atrial fibrillation: Secondary | ICD-10-CM | POA: Diagnosis not present

## 2023-02-25 DIAGNOSIS — S72141G Displaced intertrochanteric fracture of right femur, subsequent encounter for closed fracture with delayed healing: Secondary | ICD-10-CM | POA: Diagnosis not present

## 2023-02-25 DIAGNOSIS — R600 Localized edema: Secondary | ICD-10-CM | POA: Diagnosis not present

## 2023-02-25 DIAGNOSIS — I5022 Chronic systolic (congestive) heart failure: Secondary | ICD-10-CM | POA: Diagnosis not present

## 2023-02-28 DIAGNOSIS — Z23 Encounter for immunization: Secondary | ICD-10-CM | POA: Diagnosis not present

## 2023-02-28 DIAGNOSIS — Z992 Dependence on renal dialysis: Secondary | ICD-10-CM | POA: Diagnosis not present

## 2023-02-28 DIAGNOSIS — D509 Iron deficiency anemia, unspecified: Secondary | ICD-10-CM | POA: Diagnosis not present

## 2023-02-28 DIAGNOSIS — D631 Anemia in chronic kidney disease: Secondary | ICD-10-CM | POA: Diagnosis not present

## 2023-02-28 DIAGNOSIS — N250 Renal osteodystrophy: Secondary | ICD-10-CM | POA: Diagnosis not present

## 2023-02-28 DIAGNOSIS — N186 End stage renal disease: Secondary | ICD-10-CM | POA: Diagnosis not present

## 2023-03-01 DIAGNOSIS — E039 Hypothyroidism, unspecified: Secondary | ICD-10-CM | POA: Diagnosis not present

## 2023-03-01 DIAGNOSIS — Z9181 History of falling: Secondary | ICD-10-CM | POA: Diagnosis not present

## 2023-03-01 DIAGNOSIS — E785 Hyperlipidemia, unspecified: Secondary | ICD-10-CM | POA: Diagnosis not present

## 2023-03-01 DIAGNOSIS — I480 Paroxysmal atrial fibrillation: Secondary | ICD-10-CM | POA: Diagnosis not present

## 2023-03-01 DIAGNOSIS — G4700 Insomnia, unspecified: Secondary | ICD-10-CM | POA: Diagnosis not present

## 2023-03-01 DIAGNOSIS — S72141G Displaced intertrochanteric fracture of right femur, subsequent encounter for closed fracture with delayed healing: Secondary | ICD-10-CM | POA: Diagnosis not present

## 2023-03-01 DIAGNOSIS — Z951 Presence of aortocoronary bypass graft: Secondary | ICD-10-CM | POA: Diagnosis not present

## 2023-03-01 DIAGNOSIS — I429 Cardiomyopathy, unspecified: Secondary | ICD-10-CM | POA: Diagnosis not present

## 2023-03-01 DIAGNOSIS — I951 Orthostatic hypotension: Secondary | ICD-10-CM | POA: Diagnosis not present

## 2023-03-01 DIAGNOSIS — K219 Gastro-esophageal reflux disease without esophagitis: Secondary | ICD-10-CM | POA: Diagnosis not present

## 2023-03-01 DIAGNOSIS — I2510 Atherosclerotic heart disease of native coronary artery without angina pectoris: Secondary | ICD-10-CM | POA: Diagnosis not present

## 2023-03-01 DIAGNOSIS — Z7902 Long term (current) use of antithrombotics/antiplatelets: Secondary | ICD-10-CM | POA: Diagnosis not present

## 2023-03-01 DIAGNOSIS — I132 Hypertensive heart and chronic kidney disease with heart failure and with stage 5 chronic kidney disease, or end stage renal disease: Secondary | ICD-10-CM | POA: Diagnosis not present

## 2023-03-01 DIAGNOSIS — I7121 Aneurysm of the ascending aorta, without rupture: Secondary | ICD-10-CM | POA: Diagnosis not present

## 2023-03-01 DIAGNOSIS — I5022 Chronic systolic (congestive) heart failure: Secondary | ICD-10-CM | POA: Diagnosis not present

## 2023-03-01 DIAGNOSIS — N186 End stage renal disease: Secondary | ICD-10-CM | POA: Diagnosis not present

## 2023-03-01 DIAGNOSIS — D631 Anemia in chronic kidney disease: Secondary | ICD-10-CM | POA: Diagnosis not present

## 2023-03-01 DIAGNOSIS — Z992 Dependence on renal dialysis: Secondary | ICD-10-CM | POA: Diagnosis not present

## 2023-03-01 DIAGNOSIS — Z96641 Presence of right artificial hip joint: Secondary | ICD-10-CM | POA: Diagnosis not present

## 2023-03-01 DIAGNOSIS — Z87891 Personal history of nicotine dependence: Secondary | ICD-10-CM | POA: Diagnosis not present

## 2023-03-01 DIAGNOSIS — Z7984 Long term (current) use of oral hypoglycemic drugs: Secondary | ICD-10-CM | POA: Diagnosis not present

## 2023-03-01 DIAGNOSIS — E1122 Type 2 diabetes mellitus with diabetic chronic kidney disease: Secondary | ICD-10-CM | POA: Diagnosis not present

## 2023-03-02 DIAGNOSIS — D631 Anemia in chronic kidney disease: Secondary | ICD-10-CM | POA: Diagnosis not present

## 2023-03-02 DIAGNOSIS — N250 Renal osteodystrophy: Secondary | ICD-10-CM | POA: Diagnosis not present

## 2023-03-02 DIAGNOSIS — D509 Iron deficiency anemia, unspecified: Secondary | ICD-10-CM | POA: Diagnosis not present

## 2023-03-02 DIAGNOSIS — Z23 Encounter for immunization: Secondary | ICD-10-CM | POA: Diagnosis not present

## 2023-03-02 DIAGNOSIS — Z992 Dependence on renal dialysis: Secondary | ICD-10-CM | POA: Diagnosis not present

## 2023-03-02 DIAGNOSIS — N186 End stage renal disease: Secondary | ICD-10-CM | POA: Diagnosis not present

## 2023-03-03 ENCOUNTER — Telehealth: Payer: Self-pay

## 2023-03-03 DIAGNOSIS — I5022 Chronic systolic (congestive) heart failure: Secondary | ICD-10-CM | POA: Diagnosis not present

## 2023-03-03 DIAGNOSIS — N186 End stage renal disease: Secondary | ICD-10-CM | POA: Diagnosis not present

## 2023-03-03 DIAGNOSIS — E1122 Type 2 diabetes mellitus with diabetic chronic kidney disease: Secondary | ICD-10-CM | POA: Diagnosis not present

## 2023-03-03 DIAGNOSIS — I132 Hypertensive heart and chronic kidney disease with heart failure and with stage 5 chronic kidney disease, or end stage renal disease: Secondary | ICD-10-CM | POA: Diagnosis not present

## 2023-03-03 DIAGNOSIS — S72141G Displaced intertrochanteric fracture of right femur, subsequent encounter for closed fracture with delayed healing: Secondary | ICD-10-CM | POA: Diagnosis not present

## 2023-03-03 DIAGNOSIS — D631 Anemia in chronic kidney disease: Secondary | ICD-10-CM | POA: Diagnosis not present

## 2023-03-04 DIAGNOSIS — N186 End stage renal disease: Secondary | ICD-10-CM | POA: Diagnosis not present

## 2023-03-04 DIAGNOSIS — D631 Anemia in chronic kidney disease: Secondary | ICD-10-CM | POA: Diagnosis not present

## 2023-03-04 DIAGNOSIS — Z992 Dependence on renal dialysis: Secondary | ICD-10-CM | POA: Diagnosis not present

## 2023-03-04 DIAGNOSIS — D509 Iron deficiency anemia, unspecified: Secondary | ICD-10-CM | POA: Diagnosis not present

## 2023-03-04 DIAGNOSIS — Z23 Encounter for immunization: Secondary | ICD-10-CM | POA: Diagnosis not present

## 2023-03-04 DIAGNOSIS — N250 Renal osteodystrophy: Secondary | ICD-10-CM | POA: Diagnosis not present

## 2023-03-07 DIAGNOSIS — Z992 Dependence on renal dialysis: Secondary | ICD-10-CM | POA: Diagnosis not present

## 2023-03-07 DIAGNOSIS — N250 Renal osteodystrophy: Secondary | ICD-10-CM | POA: Diagnosis not present

## 2023-03-07 DIAGNOSIS — N186 End stage renal disease: Secondary | ICD-10-CM | POA: Diagnosis not present

## 2023-03-07 DIAGNOSIS — E119 Type 2 diabetes mellitus without complications: Secondary | ICD-10-CM | POA: Diagnosis not present

## 2023-03-07 DIAGNOSIS — D509 Iron deficiency anemia, unspecified: Secondary | ICD-10-CM | POA: Diagnosis not present

## 2023-03-07 DIAGNOSIS — Z23 Encounter for immunization: Secondary | ICD-10-CM | POA: Diagnosis not present

## 2023-03-07 DIAGNOSIS — D631 Anemia in chronic kidney disease: Secondary | ICD-10-CM | POA: Diagnosis not present

## 2023-03-07 NOTE — Telephone Encounter (Signed)
Spoke with [name] and verbal order given.

## 2023-03-08 DIAGNOSIS — E1122 Type 2 diabetes mellitus with diabetic chronic kidney disease: Secondary | ICD-10-CM | POA: Diagnosis not present

## 2023-03-08 DIAGNOSIS — I132 Hypertensive heart and chronic kidney disease with heart failure and with stage 5 chronic kidney disease, or end stage renal disease: Secondary | ICD-10-CM | POA: Diagnosis not present

## 2023-03-08 DIAGNOSIS — S72141G Displaced intertrochanteric fracture of right femur, subsequent encounter for closed fracture with delayed healing: Secondary | ICD-10-CM | POA: Diagnosis not present

## 2023-03-08 DIAGNOSIS — N186 End stage renal disease: Secondary | ICD-10-CM | POA: Diagnosis not present

## 2023-03-08 DIAGNOSIS — D631 Anemia in chronic kidney disease: Secondary | ICD-10-CM | POA: Diagnosis not present

## 2023-03-08 DIAGNOSIS — I5022 Chronic systolic (congestive) heart failure: Secondary | ICD-10-CM | POA: Diagnosis not present

## 2023-03-08 DIAGNOSIS — S72141D Displaced intertrochanteric fracture of right femur, subsequent encounter for closed fracture with routine healing: Secondary | ICD-10-CM | POA: Diagnosis not present

## 2023-03-09 DIAGNOSIS — N250 Renal osteodystrophy: Secondary | ICD-10-CM | POA: Diagnosis not present

## 2023-03-09 DIAGNOSIS — D509 Iron deficiency anemia, unspecified: Secondary | ICD-10-CM | POA: Diagnosis not present

## 2023-03-09 DIAGNOSIS — D631 Anemia in chronic kidney disease: Secondary | ICD-10-CM | POA: Diagnosis not present

## 2023-03-09 DIAGNOSIS — Z992 Dependence on renal dialysis: Secondary | ICD-10-CM | POA: Diagnosis not present

## 2023-03-09 DIAGNOSIS — N186 End stage renal disease: Secondary | ICD-10-CM | POA: Diagnosis not present

## 2023-03-09 DIAGNOSIS — Z23 Encounter for immunization: Secondary | ICD-10-CM | POA: Diagnosis not present

## 2023-03-10 DIAGNOSIS — E1122 Type 2 diabetes mellitus with diabetic chronic kidney disease: Secondary | ICD-10-CM | POA: Diagnosis not present

## 2023-03-10 DIAGNOSIS — S72141G Displaced intertrochanteric fracture of right femur, subsequent encounter for closed fracture with delayed healing: Secondary | ICD-10-CM | POA: Diagnosis not present

## 2023-03-10 DIAGNOSIS — I5022 Chronic systolic (congestive) heart failure: Secondary | ICD-10-CM | POA: Diagnosis not present

## 2023-03-10 DIAGNOSIS — N186 End stage renal disease: Secondary | ICD-10-CM | POA: Diagnosis not present

## 2023-03-10 DIAGNOSIS — I132 Hypertensive heart and chronic kidney disease with heart failure and with stage 5 chronic kidney disease, or end stage renal disease: Secondary | ICD-10-CM | POA: Diagnosis not present

## 2023-03-10 DIAGNOSIS — D631 Anemia in chronic kidney disease: Secondary | ICD-10-CM | POA: Diagnosis not present

## 2023-03-11 DIAGNOSIS — D631 Anemia in chronic kidney disease: Secondary | ICD-10-CM | POA: Diagnosis not present

## 2023-03-11 DIAGNOSIS — N250 Renal osteodystrophy: Secondary | ICD-10-CM | POA: Diagnosis not present

## 2023-03-11 DIAGNOSIS — N186 End stage renal disease: Secondary | ICD-10-CM | POA: Diagnosis not present

## 2023-03-11 DIAGNOSIS — D509 Iron deficiency anemia, unspecified: Secondary | ICD-10-CM | POA: Diagnosis not present

## 2023-03-11 DIAGNOSIS — Z23 Encounter for immunization: Secondary | ICD-10-CM | POA: Diagnosis not present

## 2023-03-11 DIAGNOSIS — Z992 Dependence on renal dialysis: Secondary | ICD-10-CM | POA: Diagnosis not present

## 2023-03-14 DIAGNOSIS — D631 Anemia in chronic kidney disease: Secondary | ICD-10-CM | POA: Diagnosis not present

## 2023-03-14 DIAGNOSIS — Z992 Dependence on renal dialysis: Secondary | ICD-10-CM | POA: Diagnosis not present

## 2023-03-14 DIAGNOSIS — N186 End stage renal disease: Secondary | ICD-10-CM | POA: Diagnosis not present

## 2023-03-14 DIAGNOSIS — D509 Iron deficiency anemia, unspecified: Secondary | ICD-10-CM | POA: Diagnosis not present

## 2023-03-14 DIAGNOSIS — Z23 Encounter for immunization: Secondary | ICD-10-CM | POA: Diagnosis not present

## 2023-03-14 DIAGNOSIS — N250 Renal osteodystrophy: Secondary | ICD-10-CM | POA: Diagnosis not present

## 2023-03-15 ENCOUNTER — Ambulatory Visit (INDEPENDENT_AMBULATORY_CARE_PROVIDER_SITE_OTHER): Payer: Medicare Other | Admitting: Internal Medicine

## 2023-03-15 VITALS — BP 101/62 | Ht 72.0 in | Wt 197.0 lb

## 2023-03-15 DIAGNOSIS — N186 End stage renal disease: Secondary | ICD-10-CM | POA: Diagnosis not present

## 2023-03-15 DIAGNOSIS — Z992 Dependence on renal dialysis: Secondary | ICD-10-CM

## 2023-03-15 DIAGNOSIS — I120 Hypertensive chronic kidney disease with stage 5 chronic kidney disease or end stage renal disease: Secondary | ICD-10-CM

## 2023-03-15 DIAGNOSIS — E1122 Type 2 diabetes mellitus with diabetic chronic kidney disease: Secondary | ICD-10-CM | POA: Diagnosis not present

## 2023-03-15 DIAGNOSIS — I132 Hypertensive heart and chronic kidney disease with heart failure and with stage 5 chronic kidney disease, or end stage renal disease: Secondary | ICD-10-CM | POA: Diagnosis not present

## 2023-03-15 DIAGNOSIS — D631 Anemia in chronic kidney disease: Secondary | ICD-10-CM | POA: Diagnosis not present

## 2023-03-15 DIAGNOSIS — I5022 Chronic systolic (congestive) heart failure: Secondary | ICD-10-CM | POA: Diagnosis not present

## 2023-03-15 DIAGNOSIS — S72141G Displaced intertrochanteric fracture of right femur, subsequent encounter for closed fracture with delayed healing: Secondary | ICD-10-CM | POA: Diagnosis not present

## 2023-03-15 LAB — POCT CBG (FASTING - GLUCOSE)-MANUAL ENTRY: Glucose Fasting, POC: 115 mg/dL — AB (ref 70–99)

## 2023-03-15 MED ORDER — FREESTYLE LIBRE 3 SENSOR MISC
1.0000 | 11 refills | Status: DC
Start: 2023-03-15 — End: 2023-12-15

## 2023-03-15 MED ORDER — FREESTYLE LIBRE 3 READER DEVI
1.0000 | Freq: Every day | 0 refills | Status: DC
Start: 2023-03-15 — End: 2023-12-15

## 2023-03-15 NOTE — Progress Notes (Signed)
Established Patient Office Visit  Subjective:  Patient ID: Matthew [name], male    DOB: 02/06/1931  Age: 89 y.o. MRN: [id number]  No chief complaint on file.   No new complaints, here for hospital follow up. Admitted for av fistula malfunction.  No other concerns at this time.   Past Medical History:  Diagnosis Date   Anginal pain (HCC)    Arthritis    Ascending aortic aneurysm (HCC) 07/18/2015   a.) TTE 07/18/2015: severe dilation of ascending aorta measuring 7.0 cm. b.) TTE 01/10/2018: aortic root 3.8 cm; ascending aorta 6.8 cm; refused surgical intervention/repair.   Atrial fibrillation (HCC)    a.) CHA2DS2-VASc = 6 (age x 2, HFrEF, HTN, previous MI, T2DM). b.) rate/rhythm maintained on amiodarone + metoprolol succinate; chronic antiplatelet therapy using clopidogrel   Bradycardia    Coronary artery disease    ESRD (end stage renal disease) (HCC)    GERD (gastroesophageal reflux disease)    HFrEF (heart failure with reduced ejection fraction) (HCC)    a.) TTE 12/03/2013: mod dec LV function; EF 35-40%; severe inferolateral and inferior HK; LA dilated; G3DD; PASP 52 mmHg. b.) TTE 07/18/2015: mild LV dysfunction with mild LVH; EF 45%; mild BAE. c.) TTE 01/10/2018: mod LV dysfunction; EF 45%; diffuse HK   HLD (hyperlipidemia)    Hypertension    IDA (iron deficiency anemia)    Long term current use of antithrombotics/antiplatelets    a.) clopidogrel   Myocardial infarction (HCC) 2000   Pneumonia    Norie [place]/P CABG x 4 2001   a.) LIMA-LAD, SVG-D1, SVG-OM1, SVG-OM2, SVG-PDA   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   left frontal scalp, EDC   Squamous cell carcinoma of skin 05/05/2020   left temporal scalp, in situ, EDC 05/13/20   T2DM (type 2 diabetes mellitus) (HCC)    Valvular insufficiency 12/03/2013   a.) TTE 12/03/2013: EF 35-40%; mild AR/MR, b.) TTE 07/18/2015: EF 45%; mild AR/PR, mod TR, severe MR. c.) TTE 01/10/2018: mod AR/TR    Past Surgical History:  Procedure  Laterality Date   A/V FISTULAGRAM Left 02/09/2023   Procedure: A/V Fistulagram;  Surgeon: Annice Needy, MD;  Location: [facility];  Service: Cardiovascular;  Laterality: Left;   A/V SHUNT INTERVENTION N/A 01/25/2023   Procedure: A/V SHUNT INTERVENTION;  Surgeon: Renford Dills, MD;  Location: [facility];  Service: Cardiovascular;  Laterality: N/A;   A/V SHUNTOGRAM Left 04/01/2022   Procedure: A/V SHUNTOGRAM;  Surgeon: Annice Needy, MD;  Location: [facility];  Service: Cardiovascular;  Laterality: Left;   AV FISTULA PLACEMENT Left 04/15/2021   Procedure: INSERTION OF ARTERIOVENOUS (AV) GORE-TEX GRAFT ARM ( BRACHIAL AXILLARY);  Surgeon: Annice Needy, MD;  Location: [facility];  Service: Vascular;  Laterality: Left;   CATARACT EXTRACTION W/PHACO Left 07/26/2017   Procedure: CATARACT EXTRACTION PHACO AND INTRAOCULAR LENS PLACEMENT (IOC);  Surgeon: Galen Manila, MD;  Location: [facility];  Service: Ophthalmology;  Laterality: Left;  [place]   00:45.8 AP%  13.1 CDE  6.00 Fluid Pack Lot # [id number]   CATARACT EXTRACTION W/PHACO Right 08/17/2017   Procedure: CATARACT EXTRACTION PHACO AND INTRAOCULAR LENS PLACEMENT (IOC);  Surgeon: Galen Manila, MD;  Location: [facility];  Service: Ophthalmology;  Laterality: Right;  [place]  01:30 AP% 16.8 CDE 15.24 Fluid pack lot # [id number]   CHOLECYSTECTOMY     CORONARY ARTERY BYPASS GRAFT N/A 2001   Procedure: 5v CABG (LIMA-LAD, SVG-D1, SVG-OM1, SVG-OM2, SVG-PDA)  DIALYSIS/PERMA CATHETER INSERTION N/A 02/09/2018   Procedure: DIALYSIS/PERMA CATHETER INSERTION;  Surgeon: Annice Needy, MD;  Location: [facility];  Service: Cardiovascular;  Laterality: N/A;   DIALYSIS/PERMA CATHETER INSERTION N/A 02/09/2023   Procedure: DIALYSIS/PERMA CATHETER INSERTION;  Surgeon: Annice Needy, MD;  Location: [facility];  Service: Cardiovascular;  Laterality: N/A;   DIALYSIS/PERMA CATHETER REMOVAL N/A 06/25/2021   Procedure: DIALYSIS/PERMA  CATHETER REMOVAL;  Surgeon: Annice Needy, MD;  Location: [facility];  Service: Cardiovascular;  Laterality: N/A;   FRACTURE SURGERY Left 2015   LEFT arm   INSERTION OF DIALYSIS CATHETER Right 11/03/2020   Procedure: INSERTION OF Perm Cath in the RIGHT INTERNAL JUGULAR;  Surgeon: Annice Needy, MD;  Location: [facility];  Service: Vascular;  Laterality: Right;   INTRAMEDULLARY (IM) NAIL INTERTROCHANTERIC Right 11/12/2022   Procedure: INTRAMEDULLARY (IM) NAIL INTERTROCHANTERIC;  Surgeon: Juanell Fairly, MD;  Location: [facility];  Service: Orthopedics;  Laterality: Right;   LEFT HEART CATHETERIZATION WITH CORONARY/GRAFT ANGIOGRAM Left 12/04/2013   Procedure: LEFT HEART CATHETERIZATION WITH Isabel Caprice;  Surgeon: Marykay Lex, MD;  Location: [facility] CATH LAB;  Service: Cardiovascular;  Laterality: Left;   LEFT HEART CATHETERIZATION WITH CORONARY/GRAFT ANGIOGRAM Left 02/06/2009   Procedure: LEFT HEART CATHETERIZATION WITH CORONARY/GRAFT ANGIOGRAM; Location: [facility]; Surgeon: Despina Hick, MD   PERCUTANEOUS CORONARY STENT INTERVENTION (PCI-Kynzley Dowson) N/A 12/06/2013   Procedure: STAGED PERCUTANEOUS CORONARY STENT INTERVENTION (overlapping 4.0 x 38 mm (mid) and 4.0 x 28 mm (ostial) Promus Primier DES to SVG-RPDA graft);  Surgeon: Lesleigh Noe, MD;  Location: [facility] CATH LAB;  Service: Cardiovascular   REMOVAL OF A DIALYSIS CATHETER N/A 11/03/2020   Procedure: REMOVAL OF A DIALYSIS CATHETER;  Surgeon: Annice Needy, MD;  Location: [facility];  Service: Vascular;  Laterality: N/A;   TEMPORARY DIALYSIS CATHETER N/A 02/04/2023   Procedure: TEMPORARY DIALYSIS CATHETER;  Surgeon: Renford Dills, MD;  Location: [facility];  Service: Cardiovascular;  Laterality: N/A;   TOTAL HIP REVISION Right 01/12/2023   Procedure: CONVERSION TO  RIGHT TOTAL HIP REPLACEMENT;  Surgeon: Joen Laura, MD;  Location: [facility];  Service: Orthopedics;  Laterality: Right;    Social History   Socioeconomic  History   Marital status: Widowed    Spouse name: Not on file   Number of children: Not on file   Years of education: Not on file   Highest education level: Not on file  Occupational History   Not on file  Tobacco Use   Smoking status: Former    Current packs/day: 0.00    Types: Pipe, Cigarettes    Quit date: 54    Years since quitting: 32.8   Smokeless tobacco: Never  Vaping Use   Vaping status: Never Used  Substance and Sexual Activity   Alcohol use: Not Currently    Alcohol/week: 1.0 standard drink of alcohol    Types: 1 Cans of beer per week    Comment: occasionally drinking only,once a month   Drug use: No   Sexual activity: Never  Other Topics Concern   Not on file  Social History Narrative   Lives with daughter and son in law   Social Determinants of Health   Financial Resource Strain: Low Risk  (01/10/2018)   Overall Financial Resource Strain (CARDIA)    Difficulty of Paying Living Expenses: Not hard at all  Food Insecurity: No Food Insecurity (02/04/2023)   Hunger Vital Sign    Worried About  Running Out of Food in the Last Year: Never true    Ran Out of Food in the Last Year: Never true  Transportation Needs: No Transportation Needs (02/04/2023)   PRAPARE - Administrator, Civil Service (Medical): No    Lack of Transportation (Non-Medical): No  Physical Activity: Inactive (01/10/2018)   Exercise Vital Sign    Days of Exercise per Week: 0 days    Minutes of Exercise per Session: 0 min  Stress: No Stress Concern Present (01/10/2018)   [employer] of Occupational Health - Occupational Stress Questionnaire    Feeling of Stress : Not at all  Social Connections: Socially Integrated (01/10/2018)   Social Connection and Isolation Panel [NHANES]    Frequency of Communication with Friends and Family: More than three times a week    Frequency of Social Gatherings with Friends and Family: More than three times a week    Attends Religious Services: More  than 4 times per year    Active Member of [employer] or Organizations: Yes    Attends Engineer, structural: More than 4 times per year    Marital Status: Married  Catering manager Violence: Not At Risk (02/04/2023)   Humiliation, Afraid, Rape, and Kick questionnaire    Fear of Current or Ex-Partner: No    Emotionally Abused: No    Physically Abused: No    Sexually Abused: No    Family History  Problem Relation Age of Onset   Heart attack Father 42       died first MI at age 29   Heart failure Mother     Allergies  Allergen Reactions   Amoxicillin Diarrhea    Review of Systems  Constitutional: Negative.   HENT: Negative.    Eyes: Negative.   Respiratory: Negative.    Cardiovascular: Negative.   Gastrointestinal: Negative.   Genitourinary: Negative.   Skin: Negative.   Neurological: Negative.   Endo/Heme/Allergies: Negative.        Objective:   BP 101/62   Ht 6' (1.829 m)   Wt 197 lb (89.4 kg)   SpO2 96%   BMI 26.72 kg/m   Vitals:   03/15/23 1334  BP: 101/62  Height: 6' (1.829 m)  Weight: 197 lb (89.4 kg)  SpO2: 96%  BMI (Calculated): 26.71    Physical Exam Vitals reviewed.  Constitutional:      Appearance: Normal appearance. He is obese.  HENT:     Head: Normocephalic.     Left Ear: There is no impacted cerumen.     Nose: Nose normal.     Mouth/Throat:     Mouth: Mucous membranes are moist.     Pharynx: No posterior oropharyngeal erythema.  Eyes:     Extraocular Movements: Extraocular movements intact.     Pupils: Pupils are equal, round, and reactive to light.  Cardiovascular:     Rate and Rhythm: Regular rhythm.     Chest Wall: PMI is not displaced.     Pulses: Normal pulses.     Heart sounds: Normal heart sounds. No murmur heard. Pulmonary:     Effort: Pulmonary effort is normal.     Breath sounds: Normal air entry. No rhonchi or rales.  Abdominal:     General: Abdomen is flat. Bowel sounds are normal. There is no distension.      Palpations: Abdomen is soft. There is no hepatomegaly, splenomegaly or mass.     Tenderness: There is no abdominal tenderness.  Musculoskeletal:  General: Normal range of motion.     Cervical back: Normal range of motion and neck supple.     Right lower leg: No edema.     Left lower leg: No edema.  Skin:    General: Skin is warm and dry.  Neurological:     General: No focal deficit present.     Mental Status: He is alert and oriented to person, place, and time.     Cranial Nerves: No cranial nerve deficit.     Motor: No weakness.  Psychiatric:        Mood and Affect: Mood normal.        Behavior: Behavior normal.      Results for orders placed or performed in visit on 03/15/23  POCT CBG (Fasting - Glucose)  Result Value Ref Range   Glucose Fasting, POC 115 (A) 70 - 99 mg/dL        Assessment & Plan:  As per problem list. Problem List Items Addressed This Visit       Cardiovascular and Mediastinum   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) - Primary   Relevant Medications   Continuous Glucose Sensor (FREESTYLE LIBRE 3 SENSOR) MISC   Continuous Glucose Receiver (FREESTYLE LIBRE 3 READER) DEVI   Other Relevant Orders   POCT CBG (Fasting - Glucose) (Completed)    Return in about 6 weeks (around 04/26/2023) for fu with labs prior.   Total time spent: 20 minutes  Luna Fuse, MD  03/15/2023   This document may have been prepared by [facility] Voice Recognition software and as such may include unintentional dictation errors.

## 2023-03-16 DIAGNOSIS — N250 Renal osteodystrophy: Secondary | ICD-10-CM | POA: Diagnosis not present

## 2023-03-16 DIAGNOSIS — N186 End stage renal disease: Secondary | ICD-10-CM | POA: Diagnosis not present

## 2023-03-16 DIAGNOSIS — D509 Iron deficiency anemia, unspecified: Secondary | ICD-10-CM | POA: Diagnosis not present

## 2023-03-16 DIAGNOSIS — D631 Anemia in chronic kidney disease: Secondary | ICD-10-CM | POA: Diagnosis not present

## 2023-03-16 DIAGNOSIS — Z23 Encounter for immunization: Secondary | ICD-10-CM | POA: Diagnosis not present

## 2023-03-16 DIAGNOSIS — Z992 Dependence on renal dialysis: Secondary | ICD-10-CM | POA: Diagnosis not present

## 2023-03-17 DIAGNOSIS — N186 End stage renal disease: Secondary | ICD-10-CM | POA: Diagnosis not present

## 2023-03-17 DIAGNOSIS — D631 Anemia in chronic kidney disease: Secondary | ICD-10-CM | POA: Diagnosis not present

## 2023-03-17 DIAGNOSIS — I132 Hypertensive heart and chronic kidney disease with heart failure and with stage 5 chronic kidney disease, or end stage renal disease: Secondary | ICD-10-CM | POA: Diagnosis not present

## 2023-03-17 DIAGNOSIS — I5022 Chronic systolic (congestive) heart failure: Secondary | ICD-10-CM | POA: Diagnosis not present

## 2023-03-17 DIAGNOSIS — E1122 Type 2 diabetes mellitus with diabetic chronic kidney disease: Secondary | ICD-10-CM | POA: Diagnosis not present

## 2023-03-17 DIAGNOSIS — S72141G Displaced intertrochanteric fracture of right femur, subsequent encounter for closed fracture with delayed healing: Secondary | ICD-10-CM | POA: Diagnosis not present

## 2023-03-18 DIAGNOSIS — N186 End stage renal disease: Secondary | ICD-10-CM | POA: Diagnosis not present

## 2023-03-18 DIAGNOSIS — Z23 Encounter for immunization: Secondary | ICD-10-CM | POA: Diagnosis not present

## 2023-03-18 DIAGNOSIS — D509 Iron deficiency anemia, unspecified: Secondary | ICD-10-CM | POA: Diagnosis not present

## 2023-03-18 DIAGNOSIS — N250 Renal osteodystrophy: Secondary | ICD-10-CM | POA: Diagnosis not present

## 2023-03-18 DIAGNOSIS — Z992 Dependence on renal dialysis: Secondary | ICD-10-CM | POA: Diagnosis not present

## 2023-03-18 DIAGNOSIS — D631 Anemia in chronic kidney disease: Secondary | ICD-10-CM | POA: Diagnosis not present

## 2023-03-21 DIAGNOSIS — Z23 Encounter for immunization: Secondary | ICD-10-CM | POA: Diagnosis not present

## 2023-03-21 DIAGNOSIS — N250 Renal osteodystrophy: Secondary | ICD-10-CM | POA: Diagnosis not present

## 2023-03-21 DIAGNOSIS — D631 Anemia in chronic kidney disease: Secondary | ICD-10-CM | POA: Diagnosis not present

## 2023-03-21 DIAGNOSIS — D509 Iron deficiency anemia, unspecified: Secondary | ICD-10-CM | POA: Diagnosis not present

## 2023-03-21 DIAGNOSIS — Z992 Dependence on renal dialysis: Secondary | ICD-10-CM | POA: Diagnosis not present

## 2023-03-21 DIAGNOSIS — N186 End stage renal disease: Secondary | ICD-10-CM | POA: Diagnosis not present

## 2023-03-22 ENCOUNTER — Ambulatory Visit: Payer: Medicare Other | Admitting: Internal Medicine

## 2023-03-22 DIAGNOSIS — D631 Anemia in chronic kidney disease: Secondary | ICD-10-CM | POA: Diagnosis not present

## 2023-03-22 DIAGNOSIS — E1122 Type 2 diabetes mellitus with diabetic chronic kidney disease: Secondary | ICD-10-CM | POA: Diagnosis not present

## 2023-03-22 DIAGNOSIS — I5022 Chronic systolic (congestive) heart failure: Secondary | ICD-10-CM | POA: Diagnosis not present

## 2023-03-22 DIAGNOSIS — S72141G Displaced intertrochanteric fracture of right femur, subsequent encounter for closed fracture with delayed healing: Secondary | ICD-10-CM | POA: Diagnosis not present

## 2023-03-22 DIAGNOSIS — I132 Hypertensive heart and chronic kidney disease with heart failure and with stage 5 chronic kidney disease, or end stage renal disease: Secondary | ICD-10-CM | POA: Diagnosis not present

## 2023-03-22 DIAGNOSIS — N186 End stage renal disease: Secondary | ICD-10-CM | POA: Diagnosis not present

## 2023-03-23 DIAGNOSIS — D509 Iron deficiency anemia, unspecified: Secondary | ICD-10-CM | POA: Diagnosis not present

## 2023-03-23 DIAGNOSIS — Z23 Encounter for immunization: Secondary | ICD-10-CM | POA: Diagnosis not present

## 2023-03-23 DIAGNOSIS — N250 Renal osteodystrophy: Secondary | ICD-10-CM | POA: Diagnosis not present

## 2023-03-23 DIAGNOSIS — D631 Anemia in chronic kidney disease: Secondary | ICD-10-CM | POA: Diagnosis not present

## 2023-03-23 DIAGNOSIS — N186 End stage renal disease: Secondary | ICD-10-CM | POA: Diagnosis not present

## 2023-03-23 DIAGNOSIS — Z992 Dependence on renal dialysis: Secondary | ICD-10-CM | POA: Diagnosis not present

## 2023-03-24 DIAGNOSIS — I132 Hypertensive heart and chronic kidney disease with heart failure and with stage 5 chronic kidney disease, or end stage renal disease: Secondary | ICD-10-CM | POA: Diagnosis not present

## 2023-03-24 DIAGNOSIS — I5022 Chronic systolic (congestive) heart failure: Secondary | ICD-10-CM | POA: Diagnosis not present

## 2023-03-24 DIAGNOSIS — D631 Anemia in chronic kidney disease: Secondary | ICD-10-CM | POA: Diagnosis not present

## 2023-03-24 DIAGNOSIS — N186 End stage renal disease: Secondary | ICD-10-CM | POA: Diagnosis not present

## 2023-03-24 DIAGNOSIS — S72141G Displaced intertrochanteric fracture of right femur, subsequent encounter for closed fracture with delayed healing: Secondary | ICD-10-CM | POA: Diagnosis not present

## 2023-03-24 DIAGNOSIS — E1122 Type 2 diabetes mellitus with diabetic chronic kidney disease: Secondary | ICD-10-CM | POA: Diagnosis not present

## 2023-03-24 DIAGNOSIS — Z992 Dependence on renal dialysis: Secondary | ICD-10-CM | POA: Diagnosis not present

## 2023-03-25 DIAGNOSIS — D509 Iron deficiency anemia, unspecified: Secondary | ICD-10-CM | POA: Diagnosis not present

## 2023-03-25 DIAGNOSIS — N250 Renal osteodystrophy: Secondary | ICD-10-CM | POA: Diagnosis not present

## 2023-03-25 DIAGNOSIS — Z992 Dependence on renal dialysis: Secondary | ICD-10-CM | POA: Diagnosis not present

## 2023-03-25 DIAGNOSIS — D631 Anemia in chronic kidney disease: Secondary | ICD-10-CM | POA: Diagnosis not present

## 2023-03-25 DIAGNOSIS — N186 End stage renal disease: Secondary | ICD-10-CM | POA: Diagnosis not present

## 2023-03-28 DIAGNOSIS — D509 Iron deficiency anemia, unspecified: Secondary | ICD-10-CM | POA: Diagnosis not present

## 2023-03-28 DIAGNOSIS — N250 Renal osteodystrophy: Secondary | ICD-10-CM | POA: Diagnosis not present

## 2023-03-28 DIAGNOSIS — D631 Anemia in chronic kidney disease: Secondary | ICD-10-CM | POA: Diagnosis not present

## 2023-03-28 DIAGNOSIS — Z992 Dependence on renal dialysis: Secondary | ICD-10-CM | POA: Diagnosis not present

## 2023-03-28 DIAGNOSIS — N186 End stage renal disease: Secondary | ICD-10-CM | POA: Diagnosis not present

## 2023-03-29 DIAGNOSIS — S72141G Displaced intertrochanteric fracture of right femur, subsequent encounter for closed fracture with delayed healing: Secondary | ICD-10-CM | POA: Diagnosis not present

## 2023-03-29 DIAGNOSIS — D631 Anemia in chronic kidney disease: Secondary | ICD-10-CM | POA: Diagnosis not present

## 2023-03-29 DIAGNOSIS — E1122 Type 2 diabetes mellitus with diabetic chronic kidney disease: Secondary | ICD-10-CM | POA: Diagnosis not present

## 2023-03-29 DIAGNOSIS — N186 End stage renal disease: Secondary | ICD-10-CM | POA: Diagnosis not present

## 2023-03-29 DIAGNOSIS — I5022 Chronic systolic (congestive) heart failure: Secondary | ICD-10-CM | POA: Diagnosis not present

## 2023-03-29 DIAGNOSIS — I132 Hypertensive heart and chronic kidney disease with heart failure and with stage 5 chronic kidney disease, or end stage renal disease: Secondary | ICD-10-CM | POA: Diagnosis not present

## 2023-03-30 DIAGNOSIS — N250 Renal osteodystrophy: Secondary | ICD-10-CM | POA: Diagnosis not present

## 2023-03-30 DIAGNOSIS — Z992 Dependence on renal dialysis: Secondary | ICD-10-CM | POA: Diagnosis not present

## 2023-03-30 DIAGNOSIS — N186 End stage renal disease: Secondary | ICD-10-CM | POA: Diagnosis not present

## 2023-03-30 DIAGNOSIS — D509 Iron deficiency anemia, unspecified: Secondary | ICD-10-CM | POA: Diagnosis not present

## 2023-03-30 DIAGNOSIS — D631 Anemia in chronic kidney disease: Secondary | ICD-10-CM | POA: Diagnosis not present

## 2023-03-31 DIAGNOSIS — Z87891 Personal history of nicotine dependence: Secondary | ICD-10-CM | POA: Diagnosis not present

## 2023-03-31 DIAGNOSIS — I132 Hypertensive heart and chronic kidney disease with heart failure and with stage 5 chronic kidney disease, or end stage renal disease: Secondary | ICD-10-CM | POA: Diagnosis not present

## 2023-03-31 DIAGNOSIS — E039 Hypothyroidism, unspecified: Secondary | ICD-10-CM | POA: Diagnosis not present

## 2023-03-31 DIAGNOSIS — S72141G Displaced intertrochanteric fracture of right femur, subsequent encounter for closed fracture with delayed healing: Secondary | ICD-10-CM | POA: Diagnosis not present

## 2023-03-31 DIAGNOSIS — Z7984 Long term (current) use of oral hypoglycemic drugs: Secondary | ICD-10-CM | POA: Diagnosis not present

## 2023-03-31 DIAGNOSIS — Z96641 Presence of right artificial hip joint: Secondary | ICD-10-CM | POA: Diagnosis not present

## 2023-03-31 DIAGNOSIS — Z992 Dependence on renal dialysis: Secondary | ICD-10-CM | POA: Diagnosis not present

## 2023-03-31 DIAGNOSIS — G4700 Insomnia, unspecified: Secondary | ICD-10-CM | POA: Diagnosis not present

## 2023-03-31 DIAGNOSIS — N186 End stage renal disease: Secondary | ICD-10-CM | POA: Diagnosis not present

## 2023-03-31 DIAGNOSIS — E785 Hyperlipidemia, unspecified: Secondary | ICD-10-CM | POA: Diagnosis not present

## 2023-03-31 DIAGNOSIS — K219 Gastro-esophageal reflux disease without esophagitis: Secondary | ICD-10-CM | POA: Diagnosis not present

## 2023-03-31 DIAGNOSIS — E1122 Type 2 diabetes mellitus with diabetic chronic kidney disease: Secondary | ICD-10-CM | POA: Diagnosis not present

## 2023-03-31 DIAGNOSIS — I429 Cardiomyopathy, unspecified: Secondary | ICD-10-CM | POA: Diagnosis not present

## 2023-03-31 DIAGNOSIS — Z951 Presence of aortocoronary bypass graft: Secondary | ICD-10-CM | POA: Diagnosis not present

## 2023-03-31 DIAGNOSIS — I951 Orthostatic hypotension: Secondary | ICD-10-CM | POA: Diagnosis not present

## 2023-03-31 DIAGNOSIS — I2510 Atherosclerotic heart disease of native coronary artery without angina pectoris: Secondary | ICD-10-CM | POA: Diagnosis not present

## 2023-03-31 DIAGNOSIS — D631 Anemia in chronic kidney disease: Secondary | ICD-10-CM | POA: Diagnosis not present

## 2023-03-31 DIAGNOSIS — I480 Paroxysmal atrial fibrillation: Secondary | ICD-10-CM | POA: Diagnosis not present

## 2023-03-31 DIAGNOSIS — Z9181 History of falling: Secondary | ICD-10-CM | POA: Diagnosis not present

## 2023-03-31 DIAGNOSIS — Z7902 Long term (current) use of antithrombotics/antiplatelets: Secondary | ICD-10-CM | POA: Diagnosis not present

## 2023-03-31 DIAGNOSIS — I5022 Chronic systolic (congestive) heart failure: Secondary | ICD-10-CM | POA: Diagnosis not present

## 2023-03-31 DIAGNOSIS — I7121 Aneurysm of the ascending aorta, without rupture: Secondary | ICD-10-CM | POA: Diagnosis not present

## 2023-04-01 DIAGNOSIS — D509 Iron deficiency anemia, unspecified: Secondary | ICD-10-CM | POA: Diagnosis not present

## 2023-04-01 DIAGNOSIS — D631 Anemia in chronic kidney disease: Secondary | ICD-10-CM | POA: Diagnosis not present

## 2023-04-01 DIAGNOSIS — Z992 Dependence on renal dialysis: Secondary | ICD-10-CM | POA: Diagnosis not present

## 2023-04-01 DIAGNOSIS — N186 End stage renal disease: Secondary | ICD-10-CM | POA: Diagnosis not present

## 2023-04-01 DIAGNOSIS — N250 Renal osteodystrophy: Secondary | ICD-10-CM | POA: Diagnosis not present

## 2023-04-02 DIAGNOSIS — Z23 Encounter for immunization: Secondary | ICD-10-CM | POA: Diagnosis not present

## 2023-04-04 DIAGNOSIS — D631 Anemia in chronic kidney disease: Secondary | ICD-10-CM | POA: Diagnosis not present

## 2023-04-04 DIAGNOSIS — D509 Iron deficiency anemia, unspecified: Secondary | ICD-10-CM | POA: Diagnosis not present

## 2023-04-04 DIAGNOSIS — N250 Renal osteodystrophy: Secondary | ICD-10-CM | POA: Diagnosis not present

## 2023-04-04 DIAGNOSIS — N186 End stage renal disease: Secondary | ICD-10-CM | POA: Diagnosis not present

## 2023-04-04 DIAGNOSIS — Z992 Dependence on renal dialysis: Secondary | ICD-10-CM | POA: Diagnosis not present

## 2023-04-05 DIAGNOSIS — D631 Anemia in chronic kidney disease: Secondary | ICD-10-CM | POA: Diagnosis not present

## 2023-04-05 DIAGNOSIS — N186 End stage renal disease: Secondary | ICD-10-CM | POA: Diagnosis not present

## 2023-04-05 DIAGNOSIS — I132 Hypertensive heart and chronic kidney disease with heart failure and with stage 5 chronic kidney disease, or end stage renal disease: Secondary | ICD-10-CM | POA: Diagnosis not present

## 2023-04-05 DIAGNOSIS — I5022 Chronic systolic (congestive) heart failure: Secondary | ICD-10-CM | POA: Diagnosis not present

## 2023-04-05 DIAGNOSIS — E1122 Type 2 diabetes mellitus with diabetic chronic kidney disease: Secondary | ICD-10-CM | POA: Diagnosis not present

## 2023-04-05 DIAGNOSIS — S72141G Displaced intertrochanteric fracture of right femur, subsequent encounter for closed fracture with delayed healing: Secondary | ICD-10-CM | POA: Diagnosis not present

## 2023-04-06 DIAGNOSIS — Z992 Dependence on renal dialysis: Secondary | ICD-10-CM | POA: Diagnosis not present

## 2023-04-06 DIAGNOSIS — N250 Renal osteodystrophy: Secondary | ICD-10-CM | POA: Diagnosis not present

## 2023-04-06 DIAGNOSIS — D509 Iron deficiency anemia, unspecified: Secondary | ICD-10-CM | POA: Diagnosis not present

## 2023-04-06 DIAGNOSIS — D631 Anemia in chronic kidney disease: Secondary | ICD-10-CM | POA: Diagnosis not present

## 2023-04-06 DIAGNOSIS — N186 End stage renal disease: Secondary | ICD-10-CM | POA: Diagnosis not present

## 2023-04-08 DIAGNOSIS — D631 Anemia in chronic kidney disease: Secondary | ICD-10-CM | POA: Diagnosis not present

## 2023-04-08 DIAGNOSIS — Z992 Dependence on renal dialysis: Secondary | ICD-10-CM | POA: Diagnosis not present

## 2023-04-08 DIAGNOSIS — N250 Renal osteodystrophy: Secondary | ICD-10-CM | POA: Diagnosis not present

## 2023-04-08 DIAGNOSIS — N186 End stage renal disease: Secondary | ICD-10-CM | POA: Diagnosis not present

## 2023-04-08 DIAGNOSIS — D509 Iron deficiency anemia, unspecified: Secondary | ICD-10-CM | POA: Diagnosis not present

## 2023-04-11 DIAGNOSIS — D631 Anemia in chronic kidney disease: Secondary | ICD-10-CM | POA: Diagnosis not present

## 2023-04-11 DIAGNOSIS — D509 Iron deficiency anemia, unspecified: Secondary | ICD-10-CM | POA: Diagnosis not present

## 2023-04-11 DIAGNOSIS — N186 End stage renal disease: Secondary | ICD-10-CM | POA: Diagnosis not present

## 2023-04-11 DIAGNOSIS — N250 Renal osteodystrophy: Secondary | ICD-10-CM | POA: Diagnosis not present

## 2023-04-11 DIAGNOSIS — Z992 Dependence on renal dialysis: Secondary | ICD-10-CM | POA: Diagnosis not present

## 2023-04-12 ENCOUNTER — Encounter: Payer: Self-pay | Admitting: Cardiovascular Disease

## 2023-04-12 ENCOUNTER — Ambulatory Visit (INDEPENDENT_AMBULATORY_CARE_PROVIDER_SITE_OTHER): Payer: Medicare Other | Admitting: Cardiovascular Disease

## 2023-04-12 VITALS — BP 110/75 | HR 66 | Ht 72.0 in | Wt 198.6 lb

## 2023-04-12 DIAGNOSIS — I480 Paroxysmal atrial fibrillation: Secondary | ICD-10-CM

## 2023-04-12 DIAGNOSIS — I429 Cardiomyopathy, unspecified: Secondary | ICD-10-CM

## 2023-04-12 DIAGNOSIS — I25810 Atherosclerosis of coronary artery bypass graft(s) without angina pectoris: Secondary | ICD-10-CM

## 2023-04-12 DIAGNOSIS — E782 Mixed hyperlipidemia: Secondary | ICD-10-CM | POA: Diagnosis not present

## 2023-04-12 DIAGNOSIS — I5022 Chronic systolic (congestive) heart failure: Secondary | ICD-10-CM

## 2023-04-12 DIAGNOSIS — D631 Anemia in chronic kidney disease: Secondary | ICD-10-CM | POA: Diagnosis not present

## 2023-04-12 DIAGNOSIS — S72141G Displaced intertrochanteric fracture of right femur, subsequent encounter for closed fracture with delayed healing: Secondary | ICD-10-CM | POA: Diagnosis not present

## 2023-04-12 DIAGNOSIS — I120 Hypertensive chronic kidney disease with stage 5 chronic kidney disease or end stage renal disease: Secondary | ICD-10-CM | POA: Diagnosis not present

## 2023-04-12 DIAGNOSIS — I132 Hypertensive heart and chronic kidney disease with heart failure and with stage 5 chronic kidney disease, or end stage renal disease: Secondary | ICD-10-CM | POA: Diagnosis not present

## 2023-04-12 DIAGNOSIS — N186 End stage renal disease: Secondary | ICD-10-CM

## 2023-04-12 DIAGNOSIS — E1122 Type 2 diabetes mellitus with diabetic chronic kidney disease: Secondary | ICD-10-CM | POA: Diagnosis not present

## 2023-04-12 DIAGNOSIS — Z992 Dependence on renal dialysis: Secondary | ICD-10-CM | POA: Diagnosis not present

## 2023-04-12 NOTE — Progress Notes (Signed)
Cardiology Office Note   Date:  04/12/2023   ID:  Matthew [name], DOB 07/14/1930, MRN [id number]  PCP:  Sherron Monday, MD  Cardiologist:  Adrian Blackwater, MD      History of Present Illness: Matthew [name] is a 89 y.o. male who presents for  Chief Complaint  Patient presents with   Follow-up    Came for evaluation      Past Medical History:  Diagnosis Date   Anginal pain (HCC)    Arthritis    Ascending aortic aneurysm (HCC) 07/18/2015   a.) TTE 07/18/2015: severe dilation of ascending aorta measuring 7.0 cm. b.) TTE 01/10/2018: aortic root 3.8 cm; ascending aorta 6.8 cm; refused surgical intervention/repair.   Atrial fibrillation (HCC)    a.) CHA2DS2-VASc = 6 (age x 2, HFrEF, HTN, previous MI, T2DM). b.) rate/rhythm maintained on amiodarone + metoprolol succinate; chronic antiplatelet therapy using clopidogrel   Bradycardia    Coronary artery disease    ESRD (end stage renal disease) (HCC)    GERD (gastroesophageal reflux disease)    HFrEF (heart failure with reduced ejection fraction) (HCC)    a.) TTE 12/03/2013: mod dec LV function; EF 35-40%; severe inferolateral and inferior HK; LA dilated; G3DD; PASP 52 mmHg. b.) TTE 07/18/2015: mild LV dysfunction with mild LVH; EF 45%; mild BAE. c.) TTE 01/10/2018: mod LV dysfunction; EF 45%; diffuse HK   HLD (hyperlipidemia)    Hypertension    IDA (iron deficiency anemia)    Long term current use of antithrombotics/antiplatelets    a.) clopidogrel   Myocardial infarction (HCC) 2000   Pneumonia    S/P CABG x 4 2001   a.) LIMA-LAD, SVG-D1, SVG-OM1, SVG-OM2, SVG-PDA   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   left frontal scalp, EDC   Squamous cell carcinoma of skin 05/05/2020   left temporal scalp, in situ, EDC 05/13/20   T2DM (type 2 diabetes mellitus) (HCC)    Valvular insufficiency 12/03/2013   a.) TTE 12/03/2013: EF 35-40%; mild AR/MR, b.) TTE 07/18/2015: EF 45%; mild AR/PR, mod TR, severe MR. c.) TTE  01/10/2018: mod AR/TR     Past Surgical History:  Procedure Laterality Date   A/V FISTULAGRAM Left 02/09/2023   Procedure: A/V Fistulagram;  Surgeon: Annice Needy, MD;  Location: [facility];  Service: Cardiovascular;  Laterality: Left;   A/V SHUNT INTERVENTION N/A 01/25/2023   Procedure: A/V SHUNT INTERVENTION;  Surgeon: Renford Dills, MD;  Location: [facility];  Service: Cardiovascular;  Laterality: N/A;   A/V SHUNTOGRAM Left 04/01/2022   Procedure: A/V SHUNTOGRAM;  Surgeon: Annice Needy, MD;  Location: [facility];  Service: Cardiovascular;  Laterality: Left;   AV FISTULA PLACEMENT Left 04/15/2021   Procedure: INSERTION OF ARTERIOVENOUS (AV) GORE-TEX GRAFT ARM ( BRACHIAL AXILLARY);  Surgeon: Annice Needy, MD;  Location: [facility];  Service: Vascular;  Laterality: Left;   CATARACT EXTRACTION W/PHACO Left 07/26/2017   Procedure: CATARACT EXTRACTION PHACO AND INTRAOCULAR LENS PLACEMENT (IOC);  Surgeon: Galen Manila, MD;  Location: [facility];  Service: Ophthalmology;  Laterality: Left;  [place]   00:45.8 AP%  13.1 CDE  6.00 Fluid Pack Lot # [id number]   CATARACT EXTRACTION W/PHACO Right 08/17/2017   Procedure: CATARACT EXTRACTION PHACO AND INTRAOCULAR LENS PLACEMENT (IOC);  Surgeon: Galen Manila, MD;  Location: [facility];  Service: Ophthalmology;  Laterality: Right;  [place]  01:30 AP% 16.8 CDE 15.24 Fluid pack lot # [id number]   CHOLECYSTECTOMY  CORONARY ARTERY BYPASS GRAFT N/A 2001   Procedure: 5v CABG (LIMA-LAD, SVG-D1, SVG-OM1, SVG-OM2, SVG-PDA)   DIALYSIS/PERMA CATHETER INSERTION N/A 02/09/2018   Procedure: DIALYSIS/PERMA CATHETER INSERTION;  Surgeon: Annice Needy, MD;  Location: [facility];  Service: Cardiovascular;  Laterality: N/A;   DIALYSIS/PERMA CATHETER INSERTION N/A 02/09/2023   Procedure: DIALYSIS/PERMA CATHETER INSERTION;  Surgeon: Annice Needy, MD;  Location: [facility];  Service: Cardiovascular;  Laterality: N/A;    DIALYSIS/PERMA CATHETER REMOVAL N/A 06/25/2021   Procedure: DIALYSIS/PERMA CATHETER REMOVAL;  Surgeon: Annice Needy, MD;  Location: [facility];  Service: Cardiovascular;  Laterality: N/A;   FRACTURE SURGERY Left 2015   LEFT arm   INSERTION OF DIALYSIS CATHETER Right 11/03/2020   Procedure: INSERTION OF Perm Cath in the RIGHT INTERNAL JUGULAR;  Surgeon: Annice Needy, MD;  Location: [facility];  Service: Vascular;  Laterality: Right;   INTRAMEDULLARY (IM) NAIL INTERTROCHANTERIC Right 11/12/2022   Procedure: INTRAMEDULLARY (IM) NAIL INTERTROCHANTERIC;  Surgeon: Juanell Fairly, MD;  Location: [facility];  Service: Orthopedics;  Laterality: Right;   LEFT HEART CATHETERIZATION WITH CORONARY/GRAFT ANGIOGRAM Left 12/04/2013   Procedure: LEFT HEART CATHETERIZATION WITH Isabel Caprice;  Surgeon: Marykay Lex, MD;  Location: [facility] CATH LAB;  Service: Cardiovascular;  Laterality: Left;   LEFT HEART CATHETERIZATION WITH CORONARY/GRAFT ANGIOGRAM Left 02/06/2009   Procedure: LEFT HEART CATHETERIZATION WITH CORONARY/GRAFT ANGIOGRAM; Location: [facility]; Surgeon: Despina Hick, MD   PERCUTANEOUS CORONARY STENT INTERVENTION (PCI-S) N/A 12/06/2013   Procedure: STAGED PERCUTANEOUS CORONARY STENT INTERVENTION (overlapping 4.0 x 38 mm (mid) and 4.0 x 28 mm (ostial) Promus Primier DES to SVG-RPDA graft);  Surgeon: Lesleigh Noe, MD;  Location: [facility] CATH LAB;  Service: Cardiovascular   REMOVAL OF A DIALYSIS CATHETER N/A 11/03/2020   Procedure: REMOVAL OF A DIALYSIS CATHETER;  Surgeon: Annice Needy, MD;  Location: [facility];  Service: Vascular;  Laterality: N/A;   TEMPORARY DIALYSIS CATHETER N/A 02/04/2023   Procedure: TEMPORARY DIALYSIS CATHETER;  Surgeon: Renford Dills, MD;  Location: [facility];  Service: Cardiovascular;  Laterality: N/A;   TOTAL HIP REVISION Right 01/12/2023   Procedure: CONVERSION TO  RIGHT TOTAL HIP REPLACEMENT;  Surgeon: Joen Laura, MD;  Location: [facility];  Service:  Orthopedics;  Laterality: Right;     Current Outpatient Medications  Medication Sig Dispense Refill   aspirin EC 81 MG tablet Take 81 mg by mouth daily. Swallow whole.     cetirizine (ZYRTEC) 10 MG tablet Take 10 mg by mouth daily.     cholecalciferol (VITAMIN D) 25 MCG (1000 UNIT) tablet Take 1,000 Units by mouth daily with breakfast.     clopidogrel (PLAVIX) 75 MG tablet Take 1 tablet (75 mg total) by mouth daily. 90 tablet 1   Continuous Glucose Receiver (FREESTYLE LIBRE 3 READER) DEVI 1 Device by Does not apply route daily. 1 each 0   Continuous Glucose Sensor (FREESTYLE LIBRE 3 SENSOR) MISC 1 patch by Does not apply route as directed. Place 1 sensor on the skin every 14 days. Use to check glucose continuously 2 each 11   ferrous sulfate 325 (65 FE) MG tablet Take 325 mg by mouth 2 (two) times daily with a meal.      fluticasone (FLONASE) 50 MCG/ACT nasal spray Place 2 sprays into both nostrils daily as needed for allergies or rhinitis.     glipiZIDE (GLUCOTROL) 2.5 mg TABS tablet Take 2.5 mg by mouth daily before breakfast.  levothyroxine (SYNTHROID) 100 MCG tablet Take 100 mcg by mouth daily before breakfast.     lidocaine-prilocaine (EMLA) cream Apply 1 Application topically as needed (before dialysis).     midodrine (PROAMATINE) 5 MG tablet Take 1 tablet (5 mg total) by mouth 3 (three) times daily with meals. (Patient taking differently: Take 5 mg by mouth 3 (three) times daily with meals. 5MG  1 HOUR BEFORE DIALYSIS)     multivitamin (RENA-VIT) TABS tablet Take 1 tablet by mouth daily.     nitroGLYCERIN (NITROSTAT) 0.4 MG SL tablet Place 0.4 mg under the tongue every 5 (five) minutes as needed for chest pain.     Omega-3 Fatty Acids (FISH OIL) 1000 MG CAPS Take 4,000 mg by mouth daily with lunch.     polyethylene glycol (MIRALAX / GLYCOLAX) 17 g packet Take 17 g by mouth daily. 14 each 0   rosuvastatin (CRESTOR) 40 MG tablet TAKE 1 TABLET BY MOUTH ONCE  DAILY 90 tablet 3    senna-docusate (SENOKOT-S) 8.6-50 MG tablet Take 2 tablets by mouth 2 (two) times daily. 30 tablet 0   No current facility-administered medications for this visit.    Allergies:   Amoxicillin    Social History:   reports that he quit smoking about 32 years ago. His smoking use included pipe and cigarettes. He has never used smokeless tobacco. He reports that he does not currently use alcohol after a past usage of about 1.0 standard drink of alcohol per week. He reports that he does not use drugs.   Family History:  family history includes Heart attack (age of onset: 30) in his father; Heart failure in his mother.    ROS:     Review of Systems  Constitutional: Negative.   HENT: Negative.    Eyes: Negative.   Respiratory: Negative.    Gastrointestinal: Negative.   Genitourinary: Negative.   Musculoskeletal: Negative.   Skin: Negative.   Neurological: Negative.   Endo/Heme/Allergies: Negative.   Psychiatric/Behavioral: Negative.    All other systems reviewed and are negative.     All other systems are reviewed and negative.    PHYSICAL EXAM: VS:  BP 110/75   Pulse 66   Ht 6' (1.829 m)   Wt 198 lb 9.6 oz (90.1 kg)   SpO2 98%   BMI 26.94 kg/m  , BMI Body mass index is 26.94 kg/m. Last weight:  Wt Readings from Last 3 Encounters:  04/12/23 198 lb 9.6 oz (90.1 kg)  03/15/23 197 lb (89.4 kg)  02/10/23 210 lb (95.3 kg)     Physical Exam Vitals reviewed.  Constitutional:      Appearance: Normal appearance. He is normal weight.  HENT:     Head: Normocephalic.     Nose: Nose normal.     Mouth/Throat:     Mouth: Mucous membranes are moist.  Eyes:     Pupils: Pupils are equal, round, and reactive to light.  Cardiovascular:     Rate and Rhythm: Normal rate and regular rhythm.     Pulses: Normal pulses.     Heart sounds: Normal heart sounds.  Pulmonary:     Effort: Pulmonary effort is normal.  Abdominal:     General: Abdomen is flat. Bowel sounds are normal.   Musculoskeletal:        General: Normal range of motion.     Cervical back: Normal range of motion.  Skin:    General: Skin is warm.  Neurological:     General: No  focal deficit present.     Mental Status: He is alert.  Psychiatric:        Mood and Affect: Mood normal.       EKG:   Recent Labs: 01/10/2023: Magnesium 1.9; TSH 4.027 02/04/2023: ALT 10 02/10/2023: BUN 27; Creatinine, Ser 3.72; Hemoglobin 7.9; Platelets 183; Potassium 3.3; Sodium 135    Lipid Panel    Component Value Date/Time   CHOL 116 09/16/2022 1003   TRIG 48 09/16/2022 1003   HDL 63 09/16/2022 1003   LDLCALC 41 09/16/2022 1003      Other studies Reviewed: Additional studies/ records that were reviewed today include:  Review of the above records demonstrates:       No data to display            ASSESSMENT AND PLAN:    ICD-10-CM   1. Chronic systolic CHF (congestive heart failure) (HCC)  I50.22    Compensated, but BP is low, consider losartan small dose next visit    2. Cardiomyopathy, unspecified type (HCC)  I42.9    01/11/23, LVEF  3540%    3. 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 Coronary artery disease involving coronary bypass graft of native heart without angina pectoris  I25.810    stable    5. Paroxysmal atrial fibrillation (HCC)  I48.0    In afib    6. 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    Total time spent: 30 minutes  Signed,  Adrian Blackwater, MD  04/12/2023 1:33 PM    [facility]

## 2023-04-13 DIAGNOSIS — N186 End stage renal disease: Secondary | ICD-10-CM | POA: Diagnosis not present

## 2023-04-13 DIAGNOSIS — N250 Renal osteodystrophy: Secondary | ICD-10-CM | POA: Diagnosis not present

## 2023-04-13 DIAGNOSIS — D509 Iron deficiency anemia, unspecified: Secondary | ICD-10-CM | POA: Diagnosis not present

## 2023-04-13 DIAGNOSIS — D631 Anemia in chronic kidney disease: Secondary | ICD-10-CM | POA: Diagnosis not present

## 2023-04-13 DIAGNOSIS — Z992 Dependence on renal dialysis: Secondary | ICD-10-CM | POA: Diagnosis not present

## 2023-04-15 DIAGNOSIS — Z992 Dependence on renal dialysis: Secondary | ICD-10-CM | POA: Diagnosis not present

## 2023-04-15 DIAGNOSIS — D631 Anemia in chronic kidney disease: Secondary | ICD-10-CM | POA: Diagnosis not present

## 2023-04-15 DIAGNOSIS — N250 Renal osteodystrophy: Secondary | ICD-10-CM | POA: Diagnosis not present

## 2023-04-15 DIAGNOSIS — D509 Iron deficiency anemia, unspecified: Secondary | ICD-10-CM | POA: Diagnosis not present

## 2023-04-15 DIAGNOSIS — N186 End stage renal disease: Secondary | ICD-10-CM | POA: Diagnosis not present

## 2023-04-17 DIAGNOSIS — N186 End stage renal disease: Secondary | ICD-10-CM | POA: Diagnosis not present

## 2023-04-17 DIAGNOSIS — N250 Renal osteodystrophy: Secondary | ICD-10-CM | POA: Diagnosis not present

## 2023-04-17 DIAGNOSIS — Z992 Dependence on renal dialysis: Secondary | ICD-10-CM | POA: Diagnosis not present

## 2023-04-17 DIAGNOSIS — D631 Anemia in chronic kidney disease: Secondary | ICD-10-CM | POA: Diagnosis not present

## 2023-04-17 DIAGNOSIS — D509 Iron deficiency anemia, unspecified: Secondary | ICD-10-CM | POA: Diagnosis not present

## 2023-04-18 DIAGNOSIS — I132 Hypertensive heart and chronic kidney disease with heart failure and with stage 5 chronic kidney disease, or end stage renal disease: Secondary | ICD-10-CM | POA: Diagnosis not present

## 2023-04-18 DIAGNOSIS — N186 End stage renal disease: Secondary | ICD-10-CM | POA: Diagnosis not present

## 2023-04-18 DIAGNOSIS — S72141G Displaced intertrochanteric fracture of right femur, subsequent encounter for closed fracture with delayed healing: Secondary | ICD-10-CM | POA: Diagnosis not present

## 2023-04-18 DIAGNOSIS — I5022 Chronic systolic (congestive) heart failure: Secondary | ICD-10-CM | POA: Diagnosis not present

## 2023-04-18 DIAGNOSIS — D631 Anemia in chronic kidney disease: Secondary | ICD-10-CM | POA: Diagnosis not present

## 2023-04-18 DIAGNOSIS — E1122 Type 2 diabetes mellitus with diabetic chronic kidney disease: Secondary | ICD-10-CM | POA: Diagnosis not present

## 2023-04-19 ENCOUNTER — Telehealth: Payer: Self-pay | Admitting: Internal Medicine

## 2023-04-19 DIAGNOSIS — N186 End stage renal disease: Secondary | ICD-10-CM | POA: Diagnosis not present

## 2023-04-19 DIAGNOSIS — D509 Iron deficiency anemia, unspecified: Secondary | ICD-10-CM | POA: Diagnosis not present

## 2023-04-19 DIAGNOSIS — N250 Renal osteodystrophy: Secondary | ICD-10-CM | POA: Diagnosis not present

## 2023-04-19 DIAGNOSIS — Z992 Dependence on renal dialysis: Secondary | ICD-10-CM | POA: Diagnosis not present

## 2023-04-19 DIAGNOSIS — D631 Anemia in chronic kidney disease: Secondary | ICD-10-CM | POA: Diagnosis not present

## 2023-04-19 NOTE — Telephone Encounter (Signed)
Juan with [facility] called to report a fall that patient had yesterday during their session. They were going upstairs and his right leg gave out and he fell onto Juan's leg, did not fall completely to the floor. Patient's daughter is reporting swelling in the left knee as of yesterday evening. Just FYI.

## 2023-04-22 DIAGNOSIS — D509 Iron deficiency anemia, unspecified: Secondary | ICD-10-CM | POA: Diagnosis not present

## 2023-04-22 DIAGNOSIS — N186 End stage renal disease: Secondary | ICD-10-CM | POA: Diagnosis not present

## 2023-04-22 DIAGNOSIS — D631 Anemia in chronic kidney disease: Secondary | ICD-10-CM | POA: Diagnosis not present

## 2023-04-22 DIAGNOSIS — N250 Renal osteodystrophy: Secondary | ICD-10-CM | POA: Diagnosis not present

## 2023-04-22 DIAGNOSIS — Z992 Dependence on renal dialysis: Secondary | ICD-10-CM | POA: Diagnosis not present

## 2023-04-23 DIAGNOSIS — N186 End stage renal disease: Secondary | ICD-10-CM | POA: Diagnosis not present

## 2023-04-23 DIAGNOSIS — Z992 Dependence on renal dialysis: Secondary | ICD-10-CM | POA: Diagnosis not present

## 2023-04-25 DIAGNOSIS — D509 Iron deficiency anemia, unspecified: Secondary | ICD-10-CM | POA: Diagnosis not present

## 2023-04-25 DIAGNOSIS — D631 Anemia in chronic kidney disease: Secondary | ICD-10-CM | POA: Diagnosis not present

## 2023-04-25 DIAGNOSIS — N186 End stage renal disease: Secondary | ICD-10-CM | POA: Diagnosis not present

## 2023-04-25 DIAGNOSIS — N250 Renal osteodystrophy: Secondary | ICD-10-CM | POA: Diagnosis not present

## 2023-04-25 DIAGNOSIS — Z992 Dependence on renal dialysis: Secondary | ICD-10-CM | POA: Diagnosis not present

## 2023-04-26 ENCOUNTER — Encounter: Payer: Self-pay | Admitting: Internal Medicine

## 2023-04-26 ENCOUNTER — Ambulatory Visit (INDEPENDENT_AMBULATORY_CARE_PROVIDER_SITE_OTHER): Payer: Medicare Other | Admitting: Internal Medicine

## 2023-04-26 VITALS — BP 102/71 | HR 88 | Ht 72.0 in | Wt 190.0 lb

## 2023-04-26 DIAGNOSIS — E1122 Type 2 diabetes mellitus with diabetic chronic kidney disease: Secondary | ICD-10-CM | POA: Diagnosis not present

## 2023-04-26 DIAGNOSIS — N186 End stage renal disease: Secondary | ICD-10-CM | POA: Diagnosis not present

## 2023-04-26 DIAGNOSIS — E782 Mixed hyperlipidemia: Secondary | ICD-10-CM | POA: Diagnosis not present

## 2023-04-26 DIAGNOSIS — I120 Hypertensive chronic kidney disease with stage 5 chronic kidney disease or end stage renal disease: Secondary | ICD-10-CM

## 2023-04-26 DIAGNOSIS — Z992 Dependence on renal dialysis: Secondary | ICD-10-CM

## 2023-04-26 MED ORDER — GLIPIZIDE 2.5 MG PO TABS
1.0000 | ORAL_TABLET | Freq: Every morning | ORAL | 1 refills | Status: DC
Start: 2023-04-26 — End: 2023-10-04

## 2023-04-26 NOTE — Progress Notes (Signed)
Established Patient Office Visit  Subjective:  Patient ID: Matthew [name], male    DOB: 01-03-1931  Age: 89 y.o. MRN: [id number]  Chief Complaint  Patient presents with   Follow-up    No new complaints, here for lab review and medication refills. Home bg readings have been at target.  No other concerns at this time.   Past Medical History:  Diagnosis Date   Anginal pain (HCC)    Arthritis    Ascending aortic aneurysm (HCC) 07/18/2015   a.) TTE 07/18/2015: severe dilation of ascending aorta measuring 7.0 cm. b.) TTE 01/10/2018: aortic root 3.8 cm; ascending aorta 6.8 cm; refused surgical intervention/repair.   Atrial fibrillation (HCC)    a.) CHA2DS2-VASc = 6 (age x 2, HFrEF, HTN, previous MI, T2DM). b.) rate/rhythm maintained on amiodarone + metoprolol succinate; chronic antiplatelet therapy using clopidogrel   Bradycardia    Coronary artery disease    ESRD (end stage renal disease) (HCC)    GERD (gastroesophageal reflux disease)    HFrEF (heart failure with reduced ejection fraction) (HCC)    a.) TTE 12/03/2013: mod dec LV function; EF 35-40%; severe inferolateral and inferior HK; LA dilated; G3DD; PASP 52 mmHg. b.) TTE 07/18/2015: mild LV dysfunction with mild LVH; EF 45%; mild BAE. c.) TTE 01/10/2018: mod LV dysfunction; EF 45%; diffuse HK   HLD (hyperlipidemia)    Hypertension    IDA (iron deficiency anemia)    Long term current use of antithrombotics/antiplatelets    a.) clopidogrel   Myocardial infarction (HCC) 2000   Pneumonia    Triton Heidrich/P CABG x 4 2001   a.) LIMA-LAD, SVG-D1, SVG-OM1, SVG-OM2, SVG-PDA   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   left frontal scalp, EDC   Squamous cell carcinoma of skin 05/05/2020   left temporal scalp, in situ, EDC 05/13/20   T2DM (type 2 diabetes mellitus) (HCC)    Valvular insufficiency 12/03/2013   a.) TTE 12/03/2013: EF 35-40%; mild AR/MR, b.) TTE 07/18/2015: EF 45%; mild AR/PR, mod TR, severe MR. c.) TTE 01/10/2018: mod AR/TR     Past Surgical History:  Procedure Laterality Date   A/V FISTULAGRAM Left 02/09/2023   Procedure: A/V Fistulagram;  Surgeon: Annice Needy, MD;  Location: [facility];  Service: Cardiovascular;  Laterality: Left;   A/V SHUNT INTERVENTION N/A 01/25/2023   Procedure: A/V SHUNT INTERVENTION;  Surgeon: Renford Dills, MD;  Location: [facility];  Service: Cardiovascular;  Laterality: N/A;   A/V SHUNTOGRAM Left 04/01/2022   Procedure: A/V SHUNTOGRAM;  Surgeon: Annice Needy, MD;  Location: [facility];  Service: Cardiovascular;  Laterality: Left;   AV FISTULA PLACEMENT Left 04/15/2021   Procedure: INSERTION OF ARTERIOVENOUS (AV) GORE-TEX GRAFT ARM ( BRACHIAL AXILLARY);  Surgeon: Annice Needy, MD;  Location: [facility];  Service: Vascular;  Laterality: Left;   CATARACT EXTRACTION W/PHACO Left 07/26/2017   Procedure: CATARACT EXTRACTION PHACO AND INTRAOCULAR LENS PLACEMENT (IOC);  Surgeon: Galen Manila, MD;  Location: [facility];  Service: Ophthalmology;  Laterality: Left;  [place]   00:45.8 AP%  13.1 CDE  6.00 Fluid Pack Lot # [id number]   CATARACT EXTRACTION W/PHACO Right 08/17/2017   Procedure: CATARACT EXTRACTION PHACO AND INTRAOCULAR LENS PLACEMENT (IOC);  Surgeon: Galen Manila, MD;  Location: [facility];  Service: Ophthalmology;  Laterality: Right;  [place]  01:30 AP% 16.8 CDE 15.24 Fluid pack lot # [id number]   CHOLECYSTECTOMY     CORONARY ARTERY BYPASS GRAFT N/A 2001   Procedure:  5v CABG (LIMA-LAD, SVG-D1, SVG-OM1, SVG-OM2, SVG-PDA)   DIALYSIS/PERMA CATHETER INSERTION N/A 02/09/2018   Procedure: DIALYSIS/PERMA CATHETER INSERTION;  Surgeon: Annice Needy, MD;  Location: [facility];  Service: Cardiovascular;  Laterality: N/A;   DIALYSIS/PERMA CATHETER INSERTION N/A 02/09/2023   Procedure: DIALYSIS/PERMA CATHETER INSERTION;  Surgeon: Annice Needy, MD;  Location: [facility];  Service: Cardiovascular;  Laterality: N/A;   DIALYSIS/PERMA CATHETER REMOVAL N/A  06/25/2021   Procedure: DIALYSIS/PERMA CATHETER REMOVAL;  Surgeon: Annice Needy, MD;  Location: [facility];  Service: Cardiovascular;  Laterality: N/A;   FRACTURE SURGERY Left 2015   LEFT arm   INSERTION OF DIALYSIS CATHETER Right 11/03/2020   Procedure: INSERTION OF Perm Cath in the RIGHT INTERNAL JUGULAR;  Surgeon: Annice Needy, MD;  Location: [facility];  Service: Vascular;  Laterality: Right;   INTRAMEDULLARY (IM) NAIL INTERTROCHANTERIC Right 11/12/2022   Procedure: INTRAMEDULLARY (IM) NAIL INTERTROCHANTERIC;  Surgeon: Juanell Fairly, MD;  Location: [facility];  Service: Orthopedics;  Laterality: Right;   LEFT HEART CATHETERIZATION WITH CORONARY/GRAFT ANGIOGRAM Left 12/04/2013   Procedure: LEFT HEART CATHETERIZATION WITH Isabel Caprice;  Surgeon: Marykay Lex, MD;  Location: [facility] CATH [facility];  Service: Cardiovascular;  Laterality: Left;   LEFT HEART CATHETERIZATION WITH CORONARY/GRAFT ANGIOGRAM Left 02/06/2009   Procedure: LEFT HEART CATHETERIZATION WITH CORONARY/GRAFT ANGIOGRAM; Location: [facility]; Surgeon: Despina Hick, MD   PERCUTANEOUS CORONARY STENT INTERVENTION (PCI-Wai Minotti) N/A 12/06/2013   Procedure: STAGED PERCUTANEOUS CORONARY STENT INTERVENTION (overlapping 4.0 x 38 mm (mid) and 4.0 x 28 mm (ostial) Promus Primier DES to SVG-RPDA graft);  Surgeon: Lesleigh Noe, MD;  Location: [facility] CATH LAB;  Service: Cardiovascular   REMOVAL OF A DIALYSIS CATHETER N/A 11/03/2020   Procedure: REMOVAL OF A DIALYSIS CATHETER;  Surgeon: Annice Needy, MD;  Location: [facility];  Service: Vascular;  Laterality: N/A;   TEMPORARY DIALYSIS CATHETER N/A 02/04/2023   Procedure: TEMPORARY DIALYSIS CATHETER;  Surgeon: Renford Dills, MD;  Location: [facility];  Service: Cardiovascular;  Laterality: N/A;   TOTAL HIP REVISION Right 01/12/2023   Procedure: CONVERSION TO  RIGHT TOTAL HIP REPLACEMENT;  Surgeon: Joen Laura, MD;  [facility];  Service: Orthopedics;  Laterality: Right;     Social History   Socioeconomic History   Marital status: Widowed    Spouse name: Not on file   Number of children: Not on file   Years of education: Not on file   Highest education level: Not on file  Occupational History   Not on file  Tobacco Use   Smoking status: Former    Current packs/day: 0.00    Types: Pipe, Cigarettes    Quit date: 34    Years since quitting: 32.9   Smokeless tobacco: Never  Vaping Use   Vaping status: Never Used  Substance and Sexual Activity   Alcohol use: Not Currently    Alcohol/week: 1.0 standard drink of alcohol    Types: 1 Cans of beer per week    Comment: occasionally drinking only,once a month   Drug use: No   Sexual activity: Never  Other Topics Concern   Not on file  Social History Narrative   Lives with daughter and son in law   Social Determinants of Health   Financial Resource Strain: Low Risk  (01/10/2018)   Overall Financial Resource Strain (CARDIA)    Difficulty of Paying Living Expenses: Not hard at all  Food Insecurity: No Food Insecurity (02/04/2023)  Hunger Vital Sign    Worried About Running Out of Food in the Last Year: Never true    Ran Out of Food in the Last Year: Never true  Transportation Needs: No Transportation Needs (02/04/2023)   PRAPARE - Administrator, Civil Service (Medical): No    Lack of Transportation (Non-Medical): No  Physical Activity: Inactive (01/10/2018)   Exercise Vital Sign    Days of Exercise per Week: 0 days    Minutes of Exercise per Session: 0 min  Stress: No Stress Concern Present (01/10/2018)   [employer] of Occupational Health - Occupational Stress Questionnaire    Feeling of Stress : Not at all  Social Connections: Socially Integrated (01/10/2018)   Social Connection and Isolation Panel [NHANES]    Frequency of Communication with Friends and Family: More than three times a week    Frequency of Social Gatherings with Friends and Family: More than three times a week     Attends Religious Services: More than 4 times per year    Active Member of [employer] or Organizations: Yes    Attends Engineer, structural: More than 4 times per year    Marital Status: Married  Catering manager Violence: Not At Risk (02/04/2023)   Humiliation, Afraid, Rape, and Kick questionnaire    Fear of Current or Ex-Partner: No    Emotionally Abused: No    Physically Abused: No    Sexually Abused: No    Family History  Problem Relation Age of Onset   Heart attack Father 61       died first MI at age 63   Heart failure Mother     Allergies  Allergen Reactions   Amoxicillin Diarrhea    Outpatient Medications Prior to Visit  Medication Sig   aspirin EC 81 MG tablet Take 81 mg by mouth daily. Swallow whole.   cetirizine (ZYRTEC) 10 MG tablet Take 10 mg by mouth daily.   cholecalciferol (VITAMIN D) 25 MCG (1000 UNIT) tablet Take 1,000 Units by mouth daily with breakfast.   clopidogrel (PLAVIX) 75 MG tablet Take 1 tablet (75 mg total) by mouth daily.   Continuous Glucose Receiver (FREESTYLE LIBRE 3 READER) DEVI 1 Device by Does not apply route daily.   Continuous Glucose Sensor (FREESTYLE LIBRE 3 SENSOR) MISC 1 patch by Does not apply route as directed. Place 1 sensor on the skin every 14 days. Use to check glucose continuously   ferrous sulfate 325 (65 FE) MG tablet Take 325 mg by mouth 2 (two) times daily with a meal.    fluticasone (FLONASE) 50 MCG/ACT nasal spray Place 2 sprays into both nostrils daily as needed for allergies or rhinitis.   levothyroxine (SYNTHROID) 100 MCG tablet Take 100 mcg by mouth daily before breakfast.   lidocaine-prilocaine (EMLA) cream Apply 1 Application topically as needed (before dialysis).   midodrine (PROAMATINE) 5 MG tablet Take 1 tablet (5 mg total) by mouth 3 (three) times daily with meals. (Patient taking differently: Take 5 mg by mouth 3 (three) times daily with meals. 5MG  1 HOUR BEFORE DIALYSIS)   multivitamin (RENA-VIT) TABS  tablet Take 1 tablet by mouth daily.   nitroGLYCERIN (NITROSTAT) 0.4 MG SL tablet Place 0.4 mg under the tongue every 5 (five) minutes as needed for chest pain.   Omega-3 Fatty Acids (FISH OIL) 1000 MG CAPS Take 4,000 mg by mouth daily with lunch.   polyethylene glycol (MIRALAX / GLYCOLAX) 17 g packet Take 17 g by mouth  daily.   rosuvastatin (CRESTOR) 40 MG tablet TAKE 1 TABLET BY MOUTH ONCE  DAILY   senna-docusate (SENOKOT-Shiraz Bastyr) 8.6-50 MG tablet Take 2 tablets by mouth 2 (two) times daily.   [DISCONTINUED] glipiZIDE (GLUCOTROL) 2.5 mg TABS tablet Take 2.5 mg by mouth daily before breakfast.   No facility-administered medications prior to visit.    Review of Systems  Constitutional: Negative.   HENT: Negative.    Eyes: Negative.   Respiratory: Negative.    Cardiovascular: Negative.   Gastrointestinal: Negative.   Genitourinary: Negative.   Skin: Negative.   Neurological: Negative.   Endo/Heme/Allergies: Negative.        Objective:   BP 102/71   Pulse 88   Ht 6' (1.829 m)   Wt 190 lb (86.2 kg)   SpO2 98%   BMI 25.77 kg/m   Vitals:   04/26/23 1351  BP: 102/71  Pulse: 88  Height: 6' (1.829 m)  Weight: 190 lb (86.2 kg)  SpO2: 98%  BMI (Calculated): 25.76    Physical Exam Vitals reviewed.  Constitutional:      Appearance: Normal appearance. He is obese.  HENT:     Head: Normocephalic.     Left Ear: There is no impacted cerumen.     Nose: Nose normal.     Mouth/Throat:     Mouth: Mucous membranes are moist.     Pharynx: No posterior oropharyngeal erythema.  Eyes:     Extraocular Movements: Extraocular movements intact.     Pupils: Pupils are equal, round, and reactive to light.  Cardiovascular:     Rate and Rhythm: Regular rhythm.     Chest Wall: PMI is not displaced.     Pulses: Normal pulses.     Heart sounds: Normal heart sounds. No murmur heard. Pulmonary:     Effort: Pulmonary effort is normal.     Breath sounds: Normal air entry. No rhonchi or rales.   Abdominal:     General: Abdomen is flat. Bowel sounds are normal. There is no distension.     Palpations: Abdomen is soft. There is no hepatomegaly, splenomegaly or mass.     Tenderness: There is no abdominal tenderness.  Musculoskeletal:        General: Normal range of motion.     Cervical back: Normal range of motion and neck supple.     Right lower leg: No edema.     Left lower leg: No edema.  Skin:    General: Skin is warm and dry.  Neurological:     General: No focal deficit present.     Mental Status: He is alert and oriented to person, place, and time.     Cranial Nerves: No cranial nerve deficit.     Motor: No weakness.  Psychiatric:        Mood and Affect: Mood normal.        Behavior: Behavior normal.      No results found for any visits on 04/26/23.  Recent Results (from the past 2160 hour(Aldrich Lloyd))  Glucose, capillary     Status: Abnormal   Collection Time: 01/26/23  5:31 PM  Result Value Ref Range   Glucose-Capillary 119 (H) 70 - 99 mg/dL    Comment: Glucose reference range applies only to samples taken after fasting for at least 8 hours.  Comprehensive metabolic panel     Status: Abnormal   Collection Time: 02/04/23  4:25 PM  Result Value Ref Range   Sodium 136 135 - 145 mmol/L  Potassium 3.4 (L) 3.5 - 5.1 mmol/L   Chloride 93 (L) 98 - 111 mmol/L   CO2 28 22 - 32 mmol/L   Glucose, Bld 134 (H) 70 - 99 mg/dL    Comment: Glucose reference range applies only to samples taken after fasting for at least 8 hours.   BUN 41 (H) 8 - 23 mg/dL   Creatinine, Ser 7.82 (H) 0.61 - 1.24 mg/dL   Calcium 8.6 (L) 8.9 - 10.3 mg/dL   Total Protein 6.8 6.5 - 8.1 g/dL   Albumin 3.1 (L) 3.5 - 5.0 g/dL   AST 28 15 - 41 U/L   ALT 10 0 - 44 U/L   Alkaline Phosphatase 263 (H) 38 - 126 U/L   Total Bilirubin 0.8 0.3 - 1.2 mg/dL   GFR, Estimated 13 (L) >60 mL/min    Comment: (NOTE) Calculated using the CKD-EPI Creatinine Equation (2021)    Anion gap 15 5 - 15    Comment: Performed at  [facility], [address] [place], [place] [address]  CBC with Differential     Status: Abnormal   Collection Time: 02/04/23  4:25 PM  Result Value Ref Range   WBC 7.0 4.0 - 10.5 K/uL   RBC 2.67 (L) 4.22 - 5.81 MIL/uL   Hemoglobin 8.3 (L) 13.0 - 17.0 g/dL   HCT 30.8 (L) 65.7 - 84.6 %   MCV 99.3 80.0 - 100.0 fL   MCH 31.1 26.0 - 34.0 pg   MCHC 31.3 30.0 - 36.0 g/dL   RDW 96.2 (H) 95.2 - 84.1 %   Platelets 219 150 - 400 K/uL   nRBC 0.0 0.0 - 0.2 %   Neutrophils Relative % 68 %   Neutro Abs 4.7 1.7 - 7.7 K/uL   Lymphocytes Relative 10 %   Lymphs Abs 0.7 0.7 - 4.0 K/uL   Monocytes Relative 13 %   Monocytes Absolute 0.9 0.1 - 1.0 K/uL   Eosinophils Relative 8 %   Eosinophils Absolute 0.6 (H) 0.0 - 0.5 K/uL   Basophils Relative 1 %   Basophils Absolute 0.1 0.0 - 0.1 K/uL   Immature Granulocytes 0 %   Abs Immature Granulocytes 0.03 0.00 - 0.07 K/uL    Comment: Performed at [facility], [address] [place], [place] [address]  Glucose, capillary     Status: Abnormal   Collection Time: 02/04/23  8:48 PM  Result Value Ref Range   Glucose-Capillary 145 (H) 70 - 99 mg/dL    Comment: Glucose reference range applies only to samples taken after fasting for at least 8 hours.  Glucose, capillary     Status: Abnormal   Collection Time: 02/05/23  7:28 AM  Result Value Ref Range   Glucose-Capillary 112 (H) 70 - 99 mg/dL    Comment: Glucose reference range applies only to samples taken after fasting for at least 8 hours.  Glucose, capillary     Status: Abnormal   Collection Time: 02/05/23 12:08 PM  Result Value Ref Range   Glucose-Capillary 177 (H) 70 - 99 mg/dL    Comment: Glucose reference range applies only to samples taken after fasting for at least 8 hours.  CBC     Status: Abnormal   Collection Time: 02/05/23  3:45 PM  Result Value Ref Range   WBC 6.6 4.0 - 10.5 K/uL   RBC 2.51 (L) 4.22 - 5.81 MIL/uL   Hemoglobin 7.6 (L) 13.0 - 17.0 g/dL   HCT 10.2 (L)  72.5 -  52.0 %   MCV 97.2 80.0 - 100.0 fL   MCH 30.3 26.0 - 34.0 pg   MCHC 31.1 30.0 - 36.0 g/dL   RDW 66.4 (H) 40.3 - 47.4 %   Platelets 194 150 - 400 K/uL   nRBC 0.0 0.0 - 0.2 %    Comment: Performed at [facility], [address] [place], [place] [address]  Renal function panel     Status: Abnormal   Collection Time: 02/05/23  3:45 PM  Result Value Ref Range   Sodium 135 135 - 145 mmol/L   Potassium 3.3 (L) 3.5 - 5.1 mmol/L   Chloride 94 (L) 98 - 111 mmol/L   CO2 28 22 - 32 mmol/L   Glucose, Bld 188 (H) 70 - 99 mg/dL    Comment: Glucose reference range applies only to samples taken after fasting for at least 8 hours.   BUN 49 (H) 8 - 23 mg/dL   Creatinine, Ser 3.87 (H) 0.61 - 1.24 mg/dL   Calcium 8.1 (L) 8.9 - 10.3 mg/dL   Phosphorus 4.7 (H) 2.5 - 4.6 mg/dL   Albumin 2.6 (L) 3.5 - 5.0 g/dL   GFR, Estimated 10 (L) >60 mL/min    Comment: (NOTE) Calculated using the CKD-EPI Creatinine Equation (2021)    Anion gap 13 5 - 15    Comment: Performed at [facility], [address] [place], [place] [address]  Glucose, capillary     Status: Abnormal   Collection Time: 02/05/23 10:10 PM  Result Value Ref Range   Glucose-Capillary 250 (H) 70 - 99 mg/dL    Comment: Glucose reference range applies only to samples taken after fasting for at least 8 hours.  Glucose, capillary     Status: Abnormal   Collection Time: 02/06/23 12:59 PM  Result Value Ref Range   Glucose-Capillary 146 (H) 70 - 99 mg/dL    Comment: Glucose reference range applies only to samples taken after fasting for at least 8 hours.  Glucose, capillary     Status: Abnormal   Collection Time: 02/06/23  4:51 PM  Result Value Ref Range   Glucose-Capillary 187 (H) 70 - 99 mg/dL    Comment: Glucose reference range applies only to samples taken after fasting for at least 8 hours.  Glucose, capillary     Status: Abnormal   Collection Time: 02/06/23  9:22 PM  Result Value Ref Range   Glucose-Capillary 174  (H) 70 - 99 mg/dL    Comment: Glucose reference range applies only to samples taken after fasting for at least 8 hours.  Glucose, capillary     Status: Abnormal   Collection Time: 02/07/23  8:23 AM  Result Value Ref Range   Glucose-Capillary 103 (H) 70 - 99 mg/dL    Comment: Glucose reference range applies only to samples taken after fasting for at least 8 hours.  CBC     Status: Abnormal   Collection Time: 02/07/23  9:56 AM  Result Value Ref Range   WBC 6.3 4.0 - 10.5 K/uL   RBC 2.49 (L) 4.22 - 5.81 MIL/uL   Hemoglobin 7.6 (L) 13.0 - 17.0 g/dL   HCT 29.5 (L) 18.8 - 41.6 %   MCV 95.6 80.0 - 100.0 fL   MCH 30.5 26.0 - 34.0 pg   MCHC 31.9 30.0 - 36.0 g/dL   RDW 60.6 (H) 30.1 - 60.1 %   Platelets 184 150 - 400 K/uL   nRBC 0.0 0.0 - 0.2 %  Comment: Performed at [facility], [address] [place], [place] [address]  Renal function panel     Status: Abnormal   Collection Time: 02/07/23  9:56 AM  Result Value Ref Range   Sodium 135 135 - 145 mmol/L   Potassium 3.3 (L) 3.5 - 5.1 mmol/L   Chloride 97 (L) 98 - 111 mmol/L   CO2 28 22 - 32 mmol/L   Glucose, Bld 105 (H) 70 - 99 mg/dL    Comment: Glucose reference range applies only to samples taken after fasting for at least 8 hours.   BUN 34 (H) 8 - 23 mg/dL   Creatinine, Ser 1.91 (H) 0.61 - 1.24 mg/dL   Calcium 8.1 (L) 8.9 - 10.3 mg/dL   Phosphorus 3.2 2.5 - 4.6 mg/dL   Albumin 2.6 (L) 3.5 - 5.0 g/dL   GFR, Estimated 14 (L) >60 mL/min    Comment: (NOTE) Calculated using the CKD-EPI Creatinine Equation (2021)    Anion gap 10 5 - 15    Comment: Performed at [facility], [address] [place], [place] [address]  Glucose, capillary     Status: None   Collection Time: 02/07/23  1:56 PM  Result Value Ref Range   Glucose-Capillary 97 70 - 99 mg/dL    Comment: Glucose reference range applies only to samples taken after fasting for at least 8 hours.  Glucose, capillary     Status: None   Collection Time:  02/07/23  4:29 PM  Result Value Ref Range   Glucose-Capillary 99 70 - 99 mg/dL    Comment: Glucose reference range applies only to samples taken after fasting for at least 8 hours.  Glucose, capillary     Status: Abnormal   Collection Time: 02/07/23  9:44 PM  Result Value Ref Range   Glucose-Capillary 199 (H) 70 - 99 mg/dL    Comment: Glucose reference range applies only to samples taken after fasting for at least 8 hours.  Glucose, capillary     Status: None   Collection Time: 02/08/23  9:34 AM  Result Value Ref Range   Glucose-Capillary 97 70 - 99 mg/dL    Comment: Glucose reference range applies only to samples taken after fasting for at least 8 hours.  Glucose, capillary     Status: None   Collection Time: 02/08/23 11:23 AM  Result Value Ref Range   Glucose-Capillary 99 70 - 99 mg/dL    Comment: Glucose reference range applies only to samples taken after fasting for at least 8 hours.  Glucose, capillary     Status: Abnormal   Collection Time: 02/08/23  3:25 PM  Result Value Ref Range   Glucose-Capillary 111 (H) 70 - 99 mg/dL    Comment: Glucose reference range applies only to samples taken after fasting for at least 8 hours.  Glucose, capillary     Status: Abnormal   Collection Time: 02/08/23  6:20 PM  Result Value Ref Range   Glucose-Capillary 103 (H) 70 - 99 mg/dL    Comment: Glucose reference range applies only to samples taken after fasting for at least 8 hours.   Comment 1 Notify RN   Glucose, capillary     Status: Abnormal   Collection Time: 02/08/23  8:46 PM  Result Value Ref Range   Glucose-Capillary 168 (H) 70 - 99 mg/dL    Comment: Glucose reference range applies only to samples taken after fasting for at least 8 hours.  Basic metabolic panel  Status: Abnormal   Collection Time: 02/09/23  6:03 AM  Result Value Ref Range   Sodium 136 135 - 145 mmol/L   Potassium 3.3 (L) 3.5 - 5.1 mmol/L   Chloride 96 (L) 98 - 111 mmol/L   CO2 29 22 - 32 mmol/L   Glucose, Bld  114 (H) 70 - 99 mg/dL    Comment: Glucose reference range applies only to samples taken after fasting for at least 8 hours.   BUN 29 (H) 8 - 23 mg/dL   Creatinine, Ser 0.98 (H) 0.61 - 1.24 mg/dL   Calcium 8.5 (L) 8.9 - 10.3 mg/dL   GFR, Estimated 14 (L) >60 mL/min    Comment: (NOTE) Calculated using the CKD-EPI Creatinine Equation (2021)    Anion gap 11 5 - 15    Comment: Performed at [facility], [address] [place], [place] [address]  CBC with Differential/Platelet     Status: Abnormal   Collection Time: 02/09/23  6:03 AM  Result Value Ref Range   WBC 6.0 4.0 - 10.5 K/uL   RBC 2.77 (L) 4.22 - 5.81 MIL/uL   Hemoglobin 8.4 (L) 13.0 - 17.0 g/dL   HCT 78.2 (L) 95.6 - 21.3 %   MCV 96.4 80.0 - 100.0 fL   MCH 30.3 26.0 - 34.0 pg   MCHC 31.5 30.0 - 36.0 g/dL   RDW 08.6 (H) 57.8 - 46.9 %   Platelets 198 150 - 400 K/uL   nRBC 0.0 0.0 - 0.2 %   Neutrophils Relative % 56 %   Neutro Abs 3.4 1.7 - 7.7 K/uL   Lymphocytes Relative 14 %   Lymphs Abs 0.8 0.7 - 4.0 K/uL   Monocytes Relative 17 %   Monocytes Absolute 1.0 0.1 - 1.0 K/uL   Eosinophils Relative 11 %   Eosinophils Absolute 0.7 (H) 0.0 - 0.5 K/uL   Basophils Relative 1 %   Basophils Absolute 0.1 0.0 - 0.1 K/uL   Immature Granulocytes 1 %   Abs Immature Granulocytes 0.03 0.00 - 0.07 K/uL    Comment: Performed at [facility], [address] [place], [place] [address]  Glucose, capillary     Status: None   Collection Time: 02/09/23  7:34 AM  Result Value Ref Range   Glucose-Capillary 92 70 - 99 mg/dL    Comment: Glucose reference range applies only to samples taken after fasting for at least 8 hours.  CBC     Status: Abnormal   Collection Time: 02/09/23  8:07 AM  Result Value Ref Range   WBC 5.9 4.0 - 10.5 K/uL   RBC 2.59 (L) 4.22 - 5.81 MIL/uL   Hemoglobin 7.9 (L) 13.0 - 17.0 g/dL   HCT 84.1 (L) 32.4 - 40.1 %   MCV 96.1 80.0 - 100.0 fL   MCH 30.5 26.0 - 34.0 pg   MCHC 31.7 30.0 - 36.0 g/dL   RDW  02.7 (H) 25.3 - 15.5 %   Platelets 194 150 - 400 K/uL   nRBC 0.0 0.0 - 0.2 %    Comment: Performed at [facility], [address] [place], [place] [address]  Renal function panel     Status: Abnormal   Collection Time: 02/09/23  8:07 AM  Result Value Ref Range   Sodium 137 135 - 145 mmol/L   Potassium 3.3 (L) 3.5 - 5.1 mmol/L   Chloride 96 (L) 98 - 111 mmol/L   CO2 28 22 - 32 mmol/L   Glucose, Bld  107 (H) 70 - 99 mg/dL    Comment: Glucose reference range applies only to samples taken after fasting for at least 8 hours.   BUN 30 (H) 8 - 23 mg/dL   Creatinine, Ser 1.61 (H) 0.61 - 1.24 mg/dL   Calcium 8.4 (L) 8.9 - 10.3 mg/dL   Phosphorus 3.5 2.5 - 4.6 mg/dL   Albumin 2.6 (L) 3.5 - 5.0 g/dL   GFR, Estimated 13 (L) >60 mL/min    Comment: (NOTE) Calculated using the CKD-EPI Creatinine Equation (2021)    Anion gap 13 5 - 15    Comment: Performed at [facility], [address] [address] [place], [place] [address]  Glucose, capillary     Status: None   Collection Time: 02/09/23 11:02 AM  Result Value Ref Range   Glucose-Capillary 79 70 - 99 mg/dL    Comment: Glucose reference range applies only to samples taken after fasting for at least 8 hours.  Glucose, capillary     Status: None   Collection Time: 02/09/23 12:51 PM  Result Value Ref Range   Glucose-Capillary 91 70 - 99 mg/dL    Comment: Glucose reference range applies only to samples taken after fasting for at least 8 hours.   Comment 1 Notify RN   Glucose, capillary     Status: Abnormal   Collection Time: 02/09/23  6:35 PM  Result Value Ref Range   Glucose-Capillary 126 (H) 70 - 99 mg/dL    Comment: Glucose reference range applies only to samples taken after fasting for at least 8 hours.   Comment 1 Notify RN   Glucose, capillary     Status: Abnormal   Collection Time: 02/09/23  9:53 PM  Result Value Ref Range   Glucose-Capillary 176 (H) 70 - 99 mg/dL    Comment: Glucose reference range applies only to samples  taken after fasting for at least 8 hours.  Glucose, capillary     Status: Abnormal   Collection Time: 02/10/23  7:52 AM  Result Value Ref Range   Glucose-Capillary 108 (H) 70 - 99 mg/dL    Comment: Glucose reference range applies only to samples taken after fasting for at least 8 hours.  CBC     Status: Abnormal   Collection Time: 02/10/23  8:58 AM  Result Value Ref Range   WBC 6.6 4.0 - 10.5 K/uL   RBC 2.63 (L) 4.22 - 5.81 MIL/uL   Hemoglobin 7.9 (L) 13.0 - 17.0 g/dL   HCT 54.0 (L) 98.1 - 19.1 %   MCV 97.3 80.0 - 100.0 fL   MCH 30.0 26.0 - 34.0 pg   MCHC 30.9 30.0 - 36.0 g/dL   RDW 47.8 (H) 29.5 - 62.1 %   Platelets 183 150 - 400 K/uL   nRBC 0.0 0.0 - 0.2 %    Comment: Performed at [facility], [address], [place], [place] [address]  Renal function panel     Status: Abnormal   Collection Time: 02/10/23  8:58 AM  Result Value Ref Range   Sodium 135 135 - 145 mmol/L   Potassium 3.3 (L) 3.5 - 5.1 mmol/L   Chloride 98 98 - 111 mmol/L   CO2 26 22 - 32 mmol/L   Glucose, Bld 101 (H) 70 - 99 mg/dL    Comment: Glucose reference range applies only to samples taken after fasting for at least 8 hours.   BUN 27 (H) 8 - 23 mg/dL   Creatinine, Ser 7.84 (H) 0.61 - 1.24  mg/dL   Calcium 8.2 (L) 8.9 - 10.3 mg/dL   Phosphorus 3.2 2.5 - 4.6 mg/dL   Albumin 2.4 (L) 3.5 - 5.0 g/dL   GFR, Estimated 15 (L) >60 mL/min    Comment: (NOTE) Calculated using the CKD-EPI Creatinine Equation (2021)    Anion gap 11 5 - 15    Comment: Performed at [facility], [address] [place], [place] [address]  POCT CBG (Fasting - Glucose)     Status: Abnormal   Collection Time: 03/15/23  1:41 PM  Result Value Ref Range   Glucose Fasting, POC 115 (A) 70 - 99 mg/dL      Assessment & Plan:  As per problem list  Problem List Items Addressed This Visit       Cardiovascular and Mediastinum   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   Total time spent: 20 minutes  Luna Fuse, MD  04/26/2023   This document may have been prepared by [facility] Voice Recognition software and as such may include unintentional dictation errors.

## 2023-04-27 DIAGNOSIS — D509 Iron deficiency anemia, unspecified: Secondary | ICD-10-CM | POA: Diagnosis not present

## 2023-04-27 DIAGNOSIS — N250 Renal osteodystrophy: Secondary | ICD-10-CM | POA: Diagnosis not present

## 2023-04-27 DIAGNOSIS — D631 Anemia in chronic kidney disease: Secondary | ICD-10-CM | POA: Diagnosis not present

## 2023-04-27 DIAGNOSIS — N186 End stage renal disease: Secondary | ICD-10-CM | POA: Diagnosis not present

## 2023-04-27 DIAGNOSIS — Z992 Dependence on renal dialysis: Secondary | ICD-10-CM | POA: Diagnosis not present

## 2023-04-27 LAB — LIPID PANEL
Chol/HDL Ratio: 2.3 {ratio} (ref 0.0–5.0)
Cholesterol, Total: 158 mg/dL (ref 100–199)
HDL: 69 mg/dL (ref >39)
LDL Chol Calc (NIH): 71 mg/dL (ref 0–99)
Triglycerides: 102 mg/dL (ref 0–149)
VLDL Cholesterol Cal: 18 mg/dL (ref 5–40)

## 2023-04-27 LAB — HEMOGLOBIN A1C
Est. average glucose Bld gHb Est-mCnc: 128 mg/dL
Hgb A1c MFr Bld: 6.1 % — ABNORMAL HIGH (ref 4.8–5.6)

## 2023-04-28 ENCOUNTER — Ambulatory Visit (INDEPENDENT_AMBULATORY_CARE_PROVIDER_SITE_OTHER): Payer: Medicare Other

## 2023-04-28 ENCOUNTER — Ambulatory Visit (INDEPENDENT_AMBULATORY_CARE_PROVIDER_SITE_OTHER): Payer: Medicare Other | Admitting: Nurse Practitioner

## 2023-04-28 ENCOUNTER — Encounter (INDEPENDENT_AMBULATORY_CARE_PROVIDER_SITE_OTHER): Payer: Self-pay | Admitting: Nurse Practitioner

## 2023-04-28 ENCOUNTER — Telehealth: Payer: Self-pay | Admitting: Internal Medicine

## 2023-04-28 VITALS — BP 88/56 | HR 98 | Resp 18 | Ht 72.0 in | Wt 194.6 lb

## 2023-04-28 DIAGNOSIS — Z992 Dependence on renal dialysis: Secondary | ICD-10-CM

## 2023-04-28 DIAGNOSIS — N186 End stage renal disease: Secondary | ICD-10-CM

## 2023-04-28 DIAGNOSIS — S72141G Displaced intertrochanteric fracture of right femur, subsequent encounter for closed fracture with delayed healing: Secondary | ICD-10-CM | POA: Diagnosis not present

## 2023-04-28 DIAGNOSIS — I120 Hypertensive chronic kidney disease with stage 5 chronic kidney disease or end stage renal disease: Secondary | ICD-10-CM

## 2023-04-28 DIAGNOSIS — E1122 Type 2 diabetes mellitus with diabetic chronic kidney disease: Secondary | ICD-10-CM

## 2023-04-28 DIAGNOSIS — I10 Essential (primary) hypertension: Secondary | ICD-10-CM

## 2023-04-28 DIAGNOSIS — D631 Anemia in chronic kidney disease: Secondary | ICD-10-CM | POA: Diagnosis not present

## 2023-04-28 DIAGNOSIS — I132 Hypertensive heart and chronic kidney disease with heart failure and with stage 5 chronic kidney disease, or end stage renal disease: Secondary | ICD-10-CM | POA: Diagnosis not present

## 2023-04-28 DIAGNOSIS — I5022 Chronic systolic (congestive) heart failure: Secondary | ICD-10-CM | POA: Diagnosis not present

## 2023-04-28 NOTE — Telephone Encounter (Signed)
[name], PT with [facility], left VM requesting recert for PT for strengthening for 1w8.   Also stated that patient is having leg pain that he rates 5/10. He has been taking Tylenol and Voltaren gel with no relief. Wanted to know if there was anything else that he can try because it is interfering with him being able to complete PT sessions. Please advise.

## 2023-04-29 ENCOUNTER — Other Ambulatory Visit: Payer: Self-pay | Admitting: Cardiovascular Disease

## 2023-04-29 ENCOUNTER — Other Ambulatory Visit: Payer: Self-pay | Admitting: Internal Medicine

## 2023-04-29 DIAGNOSIS — N186 End stage renal disease: Secondary | ICD-10-CM | POA: Diagnosis not present

## 2023-04-29 DIAGNOSIS — Z992 Dependence on renal dialysis: Secondary | ICD-10-CM | POA: Diagnosis not present

## 2023-04-29 DIAGNOSIS — M25569 Pain in unspecified knee: Secondary | ICD-10-CM

## 2023-04-29 DIAGNOSIS — D509 Iron deficiency anemia, unspecified: Secondary | ICD-10-CM | POA: Diagnosis not present

## 2023-04-29 DIAGNOSIS — D631 Anemia in chronic kidney disease: Secondary | ICD-10-CM | POA: Diagnosis not present

## 2023-04-29 DIAGNOSIS — N250 Renal osteodystrophy: Secondary | ICD-10-CM | POA: Diagnosis not present

## 2023-04-29 MED ORDER — CELECOXIB 200 MG PO CAPS
200.0000 mg | ORAL_CAPSULE | Freq: Every day | ORAL | 0 refills | Status: AC | PRN
Start: 2023-04-29 — End: 2023-05-29

## 2023-04-30 DIAGNOSIS — N186 End stage renal disease: Secondary | ICD-10-CM | POA: Diagnosis not present

## 2023-04-30 DIAGNOSIS — D631 Anemia in chronic kidney disease: Secondary | ICD-10-CM | POA: Diagnosis not present

## 2023-04-30 DIAGNOSIS — Z96641 Presence of right artificial hip joint: Secondary | ICD-10-CM | POA: Diagnosis not present

## 2023-04-30 DIAGNOSIS — Z87891 Personal history of nicotine dependence: Secondary | ICD-10-CM | POA: Diagnosis not present

## 2023-04-30 DIAGNOSIS — Z9181 History of falling: Secondary | ICD-10-CM | POA: Diagnosis not present

## 2023-04-30 DIAGNOSIS — G4700 Insomnia, unspecified: Secondary | ICD-10-CM | POA: Diagnosis not present

## 2023-04-30 DIAGNOSIS — S72141G Displaced intertrochanteric fracture of right femur, subsequent encounter for closed fracture with delayed healing: Secondary | ICD-10-CM | POA: Diagnosis not present

## 2023-04-30 DIAGNOSIS — I7121 Aneurysm of the ascending aorta, without rupture: Secondary | ICD-10-CM | POA: Diagnosis not present

## 2023-04-30 DIAGNOSIS — I5022 Chronic systolic (congestive) heart failure: Secondary | ICD-10-CM | POA: Diagnosis not present

## 2023-04-30 DIAGNOSIS — I951 Orthostatic hypotension: Secondary | ICD-10-CM | POA: Diagnosis not present

## 2023-04-30 DIAGNOSIS — Z992 Dependence on renal dialysis: Secondary | ICD-10-CM | POA: Diagnosis not present

## 2023-04-30 DIAGNOSIS — K219 Gastro-esophageal reflux disease without esophagitis: Secondary | ICD-10-CM | POA: Diagnosis not present

## 2023-04-30 DIAGNOSIS — I429 Cardiomyopathy, unspecified: Secondary | ICD-10-CM | POA: Diagnosis not present

## 2023-04-30 DIAGNOSIS — I2510 Atherosclerotic heart disease of native coronary artery without angina pectoris: Secondary | ICD-10-CM | POA: Diagnosis not present

## 2023-04-30 DIAGNOSIS — Z7902 Long term (current) use of antithrombotics/antiplatelets: Secondary | ICD-10-CM | POA: Diagnosis not present

## 2023-04-30 DIAGNOSIS — E785 Hyperlipidemia, unspecified: Secondary | ICD-10-CM | POA: Diagnosis not present

## 2023-04-30 DIAGNOSIS — I480 Paroxysmal atrial fibrillation: Secondary | ICD-10-CM | POA: Diagnosis not present

## 2023-04-30 DIAGNOSIS — I132 Hypertensive heart and chronic kidney disease with heart failure and with stage 5 chronic kidney disease, or end stage renal disease: Secondary | ICD-10-CM | POA: Diagnosis not present

## 2023-04-30 DIAGNOSIS — E039 Hypothyroidism, unspecified: Secondary | ICD-10-CM | POA: Diagnosis not present

## 2023-04-30 DIAGNOSIS — Z7984 Long term (current) use of oral hypoglycemic drugs: Secondary | ICD-10-CM | POA: Diagnosis not present

## 2023-04-30 DIAGNOSIS — E1122 Type 2 diabetes mellitus with diabetic chronic kidney disease: Secondary | ICD-10-CM | POA: Diagnosis not present

## 2023-04-30 DIAGNOSIS — Z951 Presence of aortocoronary bypass graft: Secondary | ICD-10-CM | POA: Diagnosis not present

## 2023-05-02 DIAGNOSIS — N186 End stage renal disease: Secondary | ICD-10-CM | POA: Diagnosis not present

## 2023-05-02 DIAGNOSIS — Z992 Dependence on renal dialysis: Secondary | ICD-10-CM | POA: Diagnosis not present

## 2023-05-02 DIAGNOSIS — D509 Iron deficiency anemia, unspecified: Secondary | ICD-10-CM | POA: Diagnosis not present

## 2023-05-02 DIAGNOSIS — N250 Renal osteodystrophy: Secondary | ICD-10-CM | POA: Diagnosis not present

## 2023-05-02 DIAGNOSIS — D631 Anemia in chronic kidney disease: Secondary | ICD-10-CM | POA: Diagnosis not present

## 2023-05-02 NOTE — Progress Notes (Signed)
Subjective:    Patient ID: Matthew [name], male    DOB: 1930/09/27, 89 y.o.   MRN: [id number] Chief Complaint  Patient presents with   Follow-up    6 month follow up with HDA    The patient returns to the office for followup status post intervention of their dialysis access 04/01/2022.   Following the intervention the access function has significantly improved, with better flow rates and improved KT/V. The patient has not been experiencing increased bleeding times following decannulation and the patient denies increased recirculation. The patient denies an increase in arm swelling. At the present time the patient denies hand pain.  No recent shortening of the patient's walking distance or new symptoms consistent with claudication.  No history of rest pain symptoms. No new ulcers or wounds of the lower extremities have occurred.  The patient denies amaurosis fugax or recent TIA symptoms. There are no recent neurological changes noted. There is no history of DVT, PE or superficial thrombophlebitis. No recent episodes of angina or shortness of breath documented.    Duplex ultrasound of the AV access shows a patent access.  The previously noted stenosis is not significantly changed compared to last study.  Flow volume today is 1841 cc/min (previously occluded)    Review of Systems  Musculoskeletal:  Positive for gait problem.  Hematological:  Does not bruise/bleed easily.  All other systems reviewed and are negative.      Objective:   Physical Exam Vitals reviewed.  HENT:     Head: Normocephalic.  Cardiovascular:     Rate and Rhythm: Normal rate.     Pulses:          Radial pulses are 2+ on the left side.  Pulmonary:     Effort: Pulmonary effort is normal.  Skin:    General: Skin is warm and dry.  Neurological:     Mental Status: He is alert and oriented to person, place, and time.  Psychiatric:        Mood and Affect: Mood normal.        Behavior: Behavior normal.         Thought Content: Thought content normal.        Judgment: Judgment normal.     BP (!) 88/56 (BP Location: Right Arm)   Pulse 98   Resp 18   Ht 6' (1.829 m)   Wt 194 lb 9.6 oz (88.3 kg)   BMI 26.39 kg/m   Past Medical History:  Diagnosis Date   Anginal pain (HCC)    Arthritis    Ascending aortic aneurysm (HCC) 07/18/2015   a.) TTE 07/18/2015: severe dilation of ascending aorta measuring 7.0 cm. b.) TTE 01/10/2018: aortic root 3.8 cm; ascending aorta 6.8 cm; refused surgical intervention/repair.   Atrial fibrillation (HCC)    a.) CHA2DS2-VASc = 6 (age x 2, HFrEF, HTN, previous MI, T2DM). b.) rate/rhythm maintained on amiodarone + metoprolol succinate; chronic antiplatelet therapy using clopidogrel   Bradycardia    Coronary artery disease    ESRD (end stage renal disease) (HCC)    GERD (gastroesophageal reflux disease)    HFrEF (heart failure with reduced ejection fraction) (HCC)    a.) TTE 12/03/2013: mod dec LV function; EF 35-40%; severe inferolateral and inferior HK; LA dilated; G3DD; PASP 52 mmHg. b.) TTE 07/18/2015: mild LV dysfunction with mild LVH; EF 45%; mild BAE. c.) TTE 01/10/2018: mod LV dysfunction; EF 45%; diffuse HK   HLD (hyperlipidemia)    Hypertension  IDA (iron deficiency anemia)    Long term current use of antithrombotics/antiplatelets    a.) clopidogrel   Myocardial infarction (HCC) 2000   Pneumonia    S/P CABG x 4 2001   a.) LIMA-LAD, SVG-D1, SVG-OM1, SVG-OM2, SVG-PDA   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   left frontal scalp, EDC   Squamous cell carcinoma of skin 05/05/2020   left temporal scalp, in situ, EDC 05/13/20   T2DM (type 2 diabetes mellitus) (HCC)    Valvular insufficiency 12/03/2013   a.) TTE 12/03/2013: EF 35-40%; mild AR/MR, b.) TTE 07/18/2015: EF 45%; mild AR/PR, mod TR, severe MR. c.) TTE 01/10/2018: mod AR/TR    Social History   Socioeconomic History   Marital status: Widowed    Spouse name: Not on file   Number of  children: Not on file   Years of education: Not on file   Highest education level: Not on file  Occupational History   Not on file  Tobacco Use   Smoking status: Former    Current packs/day: 0.00    Types: Pipe, Cigarettes    Quit date: 69    Years since quitting: 32.9   Smokeless tobacco: Never  Vaping Use   Vaping status: Never Used  Substance and Sexual Activity   Alcohol use: Not Currently    Alcohol/week: 1.0 standard drink of alcohol    Types: 1 Cans of beer per week    Comment: occasionally drinking only,once a month   Drug use: No   Sexual activity: Never  Other Topics Concern   Not on file  Social History Narrative   Lives with daughter and son in law   Social Determinants of Health   Financial Resource Strain: Low Risk  (01/10/2018)   Overall Financial Resource Strain (CARDIA)    Difficulty of Paying Living Expenses: Not hard at all  Food Insecurity: No Food Insecurity (02/04/2023)   Hunger Vital Sign    Worried About Running Out of Food in the Last Year: Never true    Ran Out of Food in the Last Year: Never true  Transportation Needs: No Transportation Needs (02/04/2023)   PRAPARE - Administrator, Civil Service (Medical): No    Lack of Transportation (Non-Medical): No  Physical Activity: Inactive (01/10/2018)   Exercise Vital Sign    Days of Exercise per Week: 0 days    Minutes of Exercise per Session: 0 min  Stress: No Stress Concern Present (01/10/2018)   [employer] of Occupational Health - Occupational Stress Questionnaire    Feeling of Stress : Not at all  Social Connections: Socially Integrated (01/10/2018)   Social Connection and Isolation Panel [NHANES]    Frequency of Communication with Friends and Family: More than three times a week    Frequency of Social Gatherings with Friends and Family: More than three times a week    Attends Religious Services: More than 4 times per year    Active Member of [employer] or Organizations: Yes     Attends Banker Meetings: More than 4 times per year    Marital Status: Married  Catering manager Violence: Not At Risk (02/04/2023)   Humiliation, Afraid, Rape, and Kick questionnaire    Fear of Current or Ex-Partner: No    Emotionally Abused: No    Physically Abused: No    Sexually Abused: No    Past Surgical History:  Procedure Laterality Date   A/V FISTULAGRAM Left 02/09/2023   Procedure: A/V Fistulagram;  Surgeon: Annice Needy, MD;  Location: [facility];  Service: Cardiovascular;  Laterality: Left;   A/V SHUNT INTERVENTION N/A 01/25/2023   Procedure: A/V SHUNT INTERVENTION;  Surgeon: Renford Dills, MD;  Location: [facility];  Service: Cardiovascular;  Laterality: N/A;   A/V SHUNTOGRAM Left 04/01/2022   Procedure: A/V SHUNTOGRAM;  Surgeon: Annice Needy, MD;  Location: [facility];  Service: Cardiovascular;  Laterality: Left;   AV FISTULA PLACEMENT Left 04/15/2021   Procedure: INSERTION OF ARTERIOVENOUS (AV) GORE-TEX GRAFT ARM ( BRACHIAL AXILLARY);  Surgeon: Annice Needy, MD;  Location: [facility];  Service: Vascular;  Laterality: Left;   CATARACT EXTRACTION W/PHACO Left 07/26/2017   Procedure: CATARACT EXTRACTION PHACO AND INTRAOCULAR LENS PLACEMENT (IOC);  Surgeon: Galen Manila, MD;  Location: [facility];  Service: Ophthalmology;  Laterality: Left;  [place]   00:45.8 AP%  13.1 CDE  6.00 Fluid Pack Lot # [id number]   CATARACT EXTRACTION W/PHACO Right 08/17/2017   Procedure: CATARACT EXTRACTION PHACO AND INTRAOCULAR LENS PLACEMENT (IOC);  Surgeon: Galen Manila, MD;  Location: [facility];  Service: Ophthalmology;  Laterality: Right;  [place]  01:30 AP% 16.8 CDE 15.24 Fluid pack lot # [id number]   CHOLECYSTECTOMY     CORONARY ARTERY BYPASS GRAFT N/A 2001   Procedure: 5v CABG (LIMA-LAD, SVG-D1, SVG-OM1, SVG-OM2, SVG-PDA)   DIALYSIS/PERMA CATHETER INSERTION N/A 02/09/2018   Procedure: DIALYSIS/PERMA CATHETER INSERTION;  Surgeon: Annice Needy, MD;   Location: [facility];  Service: Cardiovascular;  Laterality: N/A;   DIALYSIS/PERMA CATHETER INSERTION N/A 02/09/2023   Procedure: DIALYSIS/PERMA CATHETER INSERTION;  Surgeon: Annice Needy, MD;  Location: [facility];  Service: Cardiovascular;  Laterality: N/A;   DIALYSIS/PERMA CATHETER REMOVAL N/A 06/25/2021   Procedure: DIALYSIS/PERMA CATHETER REMOVAL;  Surgeon: Annice Needy, MD;  Location: [facility];  Service: Cardiovascular;  Laterality: N/A;   FRACTURE SURGERY Left 2015   LEFT arm   INSERTION OF DIALYSIS CATHETER Right 11/03/2020   Procedure: INSERTION OF Perm Cath in the RIGHT INTERNAL JUGULAR;  Surgeon: Annice Needy, MD;  Location: [facility];  Service: Vascular;  Laterality: Right;   INTRAMEDULLARY (IM) NAIL INTERTROCHANTERIC Right 11/12/2022   Procedure: INTRAMEDULLARY (IM) NAIL INTERTROCHANTERIC;  Surgeon: Juanell Fairly, MD;  Location: [facility];  Service: Orthopedics;  Laterality: Right;   LEFT HEART CATHETERIZATION WITH CORONARY/GRAFT ANGIOGRAM Left 12/04/2013   Procedure: LEFT HEART CATHETERIZATION WITH Isabel Caprice;  Surgeon: Marykay Lex, MD;  Location: [facility] CATH LAB;  Service: Cardiovascular;  Laterality: Left;   LEFT HEART CATHETERIZATION WITH CORONARY/GRAFT ANGIOGRAM Left 02/06/2009   Procedure: LEFT HEART CATHETERIZATION WITH CORONARY/GRAFT ANGIOGRAM; Location: [facility]; Surgeon: Despina Hick, MD   PERCUTANEOUS CORONARY STENT INTERVENTION (PCI-S) N/A 12/06/2013   Procedure: STAGED PERCUTANEOUS CORONARY STENT INTERVENTION (overlapping 4.0 x 38 mm (mid) and 4.0 x 28 mm (ostial) Promus Primier DES to SVG-RPDA graft);  Surgeon: Lesleigh Noe, MD;  Location: [facility] CATH LAB;  Service: Cardiovascular   REMOVAL OF A DIALYSIS CATHETER N/A 11/03/2020   Procedure: REMOVAL OF A DIALYSIS CATHETER;  Surgeon: Annice Needy, MD;  Location: [facility];  Service: Vascular;  Laterality: N/A;   TEMPORARY DIALYSIS CATHETER N/A 02/04/2023   Procedure: TEMPORARY  DIALYSIS CATHETER;  Surgeon: Renford Dills, MD;  Location: [facility];  Service: Cardiovascular;  Laterality: N/A;   TOTAL HIP REVISION Right 01/12/2023   Procedure: CONVERSION TO  RIGHT TOTAL HIP REPLACEMENT;  Surgeon: Joen Laura, MD;  Location: [facility];  Service: Orthopedics;  Laterality: Right;    Family History  Problem Relation Age of Onset   Heart attack Father 46       died first MI at age 53   Heart failure Mother     Allergies  Allergen Reactions   Amoxicillin Diarrhea       Latest Ref Rng & Units 02/10/2023    8:58 AM 02/09/2023    8:07 AM 02/09/2023    6:03 AM  CBC  WBC 4.0 - 10.5 K/uL 6.6  5.9  6.0   Hemoglobin 13.0 - 17.0 g/dL 7.9  7.9  8.4   Hematocrit 39.0 - 52.0 % 25.6  24.9  26.7   Platelets 150 - 400 K/uL 183  194  198       CMP     Component Value Date/Time   NA 135 02/10/2023 0858   NA 140 09/16/2022 1003   K 3.3 (L) 02/10/2023 0858   CL 98 02/10/2023 0858   CO2 26 02/10/2023 0858   GLUCOSE 101 (H) 02/10/2023 0858   BUN 27 (H) 02/10/2023 0858   BUN 50 (H) 09/16/2022 1003   CREATININE 3.72 (H) 02/10/2023 0858   CALCIUM 8.2 (L) 02/10/2023 0858   PROT 6.8 02/04/2023 1625   PROT 6.8 09/21/2022 1346   ALBUMIN 2.4 (L) 02/10/2023 0858   ALBUMIN 4.2 09/21/2022 1346   AST 28 02/04/2023 1625   ALT 10 02/04/2023 1625   ALKPHOS 263 (H) 02/04/2023 1625   BILITOT 0.8 02/04/2023 1625   BILITOT 1.2 09/21/2022 1346   EGFR 18 (L) 09/16/2022 1003   GFRNONAA 15 (L) 02/10/2023 0858     No results found.     Assessment & Plan:   1. ESRD (end stage renal disease) (HCC) Recommend:  The patient is doing well and currently has adequate dialysis access. The patient's dialysis center is not reporting any access issues. Flow pattern is stable when compared to the prior ultrasound.  The patient should have a duplex ultrasound of the dialysis access in 6 months. The patient will follow-up with me in the office after each ultrasound     2. 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  Hgb A1C to be monitored as already arranged by primary service  3. Essential hypertension Continue antihypertensive medications as already ordered, these medications have been reviewed and there are no changes at this time.   Current Outpatient Medications on File Prior to Visit  Medication Sig Dispense Refill   aspirin EC 81 MG tablet Take 81 mg by mouth daily. Swallow whole.     cetirizine (ZYRTEC) 10 MG tablet Take 10 mg by mouth daily.     cholecalciferol (VITAMIN D) 25 MCG (1000 UNIT) tablet Take 1,000 Units by mouth daily with breakfast.     clopidogrel (PLAVIX) 75 MG tablet Take 1 tablet (75 mg total) by mouth daily. 90 tablet 1   Continuous Glucose Receiver (FREESTYLE LIBRE 3 READER) DEVI 1 Device by Does not apply route daily. 1 each 0   Continuous Glucose Sensor (FREESTYLE LIBRE 3 SENSOR) MISC 1 patch by Does not apply route as directed. Place 1 sensor on the skin every 14 days. Use to check glucose continuously 2 each 11   ferrous sulfate 325 (65 FE) MG tablet Take 325 mg by mouth 2 (two) times daily with a meal.  fluticasone (FLONASE) 50 MCG/ACT nasal spray Place 2 sprays into both nostrils daily as needed for allergies or rhinitis.     glipiZIDE 2.5 MG TABS Take 1 tablet by mouth AC breakfast. 90 tablet 1   levothyroxine (SYNTHROID) 100 MCG tablet Take 100 mcg by mouth daily before breakfast.     lidocaine-prilocaine (EMLA) cream Apply 1 Application topically as needed (before dialysis).     midodrine (PROAMATINE) 5 MG tablet Take 1 tablet (5 mg total) by mouth 3 (three) times daily with meals. (Patient taking differently: Take 5 mg by mouth 3 (three) times daily with meals. 5MG  1 HOUR BEFORE DIALYSIS)     multivitamin (RENA-VIT) TABS tablet Take 1 tablet by mouth daily.      nitroGLYCERIN (NITROSTAT) 0.4 MG SL tablet Place 0.4 mg under the tongue every 5 (five) minutes as needed for chest pain.     Omega-3 Fatty Acids (FISH OIL) 1000 MG CAPS Take 4,000 mg by mouth daily with lunch.     polyethylene glycol (MIRALAX / GLYCOLAX) 17 g packet Take 17 g by mouth daily. 14 each 0   rosuvastatin (CRESTOR) 40 MG tablet TAKE 1 TABLET BY MOUTH ONCE  DAILY 90 tablet 3   senna-docusate (SENOKOT-S) 8.6-50 MG tablet Take 2 tablets by mouth 2 (two) times daily. 30 tablet 0   No current facility-administered medications on file prior to visit.    There are no Patient Instructions on file for this visit. No follow-ups on file.   Georgiana Spinner, NP

## 2023-05-04 DIAGNOSIS — Z992 Dependence on renal dialysis: Secondary | ICD-10-CM | POA: Diagnosis not present

## 2023-05-04 DIAGNOSIS — N186 End stage renal disease: Secondary | ICD-10-CM | POA: Diagnosis not present

## 2023-05-04 DIAGNOSIS — D509 Iron deficiency anemia, unspecified: Secondary | ICD-10-CM | POA: Diagnosis not present

## 2023-05-04 DIAGNOSIS — N250 Renal osteodystrophy: Secondary | ICD-10-CM | POA: Diagnosis not present

## 2023-05-04 DIAGNOSIS — D631 Anemia in chronic kidney disease: Secondary | ICD-10-CM | POA: Diagnosis not present

## 2023-05-05 DIAGNOSIS — I5022 Chronic systolic (congestive) heart failure: Secondary | ICD-10-CM | POA: Diagnosis not present

## 2023-05-05 DIAGNOSIS — I132 Hypertensive heart and chronic kidney disease with heart failure and with stage 5 chronic kidney disease, or end stage renal disease: Secondary | ICD-10-CM | POA: Diagnosis not present

## 2023-05-05 DIAGNOSIS — D631 Anemia in chronic kidney disease: Secondary | ICD-10-CM | POA: Diagnosis not present

## 2023-05-05 DIAGNOSIS — N186 End stage renal disease: Secondary | ICD-10-CM | POA: Diagnosis not present

## 2023-05-05 DIAGNOSIS — E1122 Type 2 diabetes mellitus with diabetic chronic kidney disease: Secondary | ICD-10-CM | POA: Diagnosis not present

## 2023-05-05 DIAGNOSIS — S72141G Displaced intertrochanteric fracture of right femur, subsequent encounter for closed fracture with delayed healing: Secondary | ICD-10-CM | POA: Diagnosis not present

## 2023-05-06 DIAGNOSIS — D509 Iron deficiency anemia, unspecified: Secondary | ICD-10-CM | POA: Diagnosis not present

## 2023-05-06 DIAGNOSIS — Z992 Dependence on renal dialysis: Secondary | ICD-10-CM | POA: Diagnosis not present

## 2023-05-06 DIAGNOSIS — N250 Renal osteodystrophy: Secondary | ICD-10-CM | POA: Diagnosis not present

## 2023-05-06 DIAGNOSIS — N186 End stage renal disease: Secondary | ICD-10-CM | POA: Diagnosis not present

## 2023-05-06 DIAGNOSIS — D631 Anemia in chronic kidney disease: Secondary | ICD-10-CM | POA: Diagnosis not present

## 2023-05-09 DIAGNOSIS — D631 Anemia in chronic kidney disease: Secondary | ICD-10-CM | POA: Diagnosis not present

## 2023-05-09 DIAGNOSIS — Z992 Dependence on renal dialysis: Secondary | ICD-10-CM | POA: Diagnosis not present

## 2023-05-09 DIAGNOSIS — N186 End stage renal disease: Secondary | ICD-10-CM | POA: Diagnosis not present

## 2023-05-09 DIAGNOSIS — N250 Renal osteodystrophy: Secondary | ICD-10-CM | POA: Diagnosis not present

## 2023-05-09 DIAGNOSIS — D509 Iron deficiency anemia, unspecified: Secondary | ICD-10-CM | POA: Diagnosis not present

## 2023-05-10 DIAGNOSIS — S72141D Displaced intertrochanteric fracture of right femur, subsequent encounter for closed fracture with routine healing: Secondary | ICD-10-CM | POA: Diagnosis not present

## 2023-05-11 DIAGNOSIS — D509 Iron deficiency anemia, unspecified: Secondary | ICD-10-CM | POA: Diagnosis not present

## 2023-05-11 DIAGNOSIS — Z992 Dependence on renal dialysis: Secondary | ICD-10-CM | POA: Diagnosis not present

## 2023-05-11 DIAGNOSIS — N186 End stage renal disease: Secondary | ICD-10-CM | POA: Diagnosis not present

## 2023-05-11 DIAGNOSIS — N250 Renal osteodystrophy: Secondary | ICD-10-CM | POA: Diagnosis not present

## 2023-05-11 DIAGNOSIS — D631 Anemia in chronic kidney disease: Secondary | ICD-10-CM | POA: Diagnosis not present

## 2023-05-12 DIAGNOSIS — D631 Anemia in chronic kidney disease: Secondary | ICD-10-CM | POA: Diagnosis not present

## 2023-05-12 DIAGNOSIS — S72141G Displaced intertrochanteric fracture of right femur, subsequent encounter for closed fracture with delayed healing: Secondary | ICD-10-CM | POA: Diagnosis not present

## 2023-05-12 DIAGNOSIS — I132 Hypertensive heart and chronic kidney disease with heart failure and with stage 5 chronic kidney disease, or end stage renal disease: Secondary | ICD-10-CM | POA: Diagnosis not present

## 2023-05-12 DIAGNOSIS — E1122 Type 2 diabetes mellitus with diabetic chronic kidney disease: Secondary | ICD-10-CM | POA: Diagnosis not present

## 2023-05-12 DIAGNOSIS — N186 End stage renal disease: Secondary | ICD-10-CM | POA: Diagnosis not present

## 2023-05-12 DIAGNOSIS — I5022 Chronic systolic (congestive) heart failure: Secondary | ICD-10-CM | POA: Diagnosis not present

## 2023-05-13 DIAGNOSIS — N186 End stage renal disease: Secondary | ICD-10-CM | POA: Diagnosis not present

## 2023-05-13 DIAGNOSIS — N250 Renal osteodystrophy: Secondary | ICD-10-CM | POA: Diagnosis not present

## 2023-05-13 DIAGNOSIS — Z992 Dependence on renal dialysis: Secondary | ICD-10-CM | POA: Diagnosis not present

## 2023-05-13 DIAGNOSIS — D509 Iron deficiency anemia, unspecified: Secondary | ICD-10-CM | POA: Diagnosis not present

## 2023-05-13 DIAGNOSIS — D631 Anemia in chronic kidney disease: Secondary | ICD-10-CM | POA: Diagnosis not present

## 2023-05-15 ENCOUNTER — Other Ambulatory Visit: Payer: Self-pay | Admitting: Internal Medicine

## 2023-05-15 DIAGNOSIS — N186 End stage renal disease: Secondary | ICD-10-CM | POA: Diagnosis not present

## 2023-05-15 DIAGNOSIS — N250 Renal osteodystrophy: Secondary | ICD-10-CM | POA: Diagnosis not present

## 2023-05-15 DIAGNOSIS — D631 Anemia in chronic kidney disease: Secondary | ICD-10-CM | POA: Diagnosis not present

## 2023-05-15 DIAGNOSIS — Z992 Dependence on renal dialysis: Secondary | ICD-10-CM | POA: Diagnosis not present

## 2023-05-15 DIAGNOSIS — D509 Iron deficiency anemia, unspecified: Secondary | ICD-10-CM | POA: Diagnosis not present

## 2023-05-17 DIAGNOSIS — D631 Anemia in chronic kidney disease: Secondary | ICD-10-CM | POA: Diagnosis not present

## 2023-05-17 DIAGNOSIS — Z992 Dependence on renal dialysis: Secondary | ICD-10-CM | POA: Diagnosis not present

## 2023-05-17 DIAGNOSIS — N250 Renal osteodystrophy: Secondary | ICD-10-CM | POA: Diagnosis not present

## 2023-05-17 DIAGNOSIS — D509 Iron deficiency anemia, unspecified: Secondary | ICD-10-CM | POA: Diagnosis not present

## 2023-05-17 DIAGNOSIS — N186 End stage renal disease: Secondary | ICD-10-CM | POA: Diagnosis not present

## 2023-05-19 DIAGNOSIS — E1122 Type 2 diabetes mellitus with diabetic chronic kidney disease: Secondary | ICD-10-CM | POA: Diagnosis not present

## 2023-05-19 DIAGNOSIS — N186 End stage renal disease: Secondary | ICD-10-CM | POA: Diagnosis not present

## 2023-05-19 DIAGNOSIS — I132 Hypertensive heart and chronic kidney disease with heart failure and with stage 5 chronic kidney disease, or end stage renal disease: Secondary | ICD-10-CM | POA: Diagnosis not present

## 2023-05-19 DIAGNOSIS — S72141G Displaced intertrochanteric fracture of right femur, subsequent encounter for closed fracture with delayed healing: Secondary | ICD-10-CM | POA: Diagnosis not present

## 2023-05-19 DIAGNOSIS — D631 Anemia in chronic kidney disease: Secondary | ICD-10-CM | POA: Diagnosis not present

## 2023-05-19 DIAGNOSIS — I5022 Chronic systolic (congestive) heart failure: Secondary | ICD-10-CM | POA: Diagnosis not present

## 2023-05-20 DIAGNOSIS — N186 End stage renal disease: Secondary | ICD-10-CM | POA: Diagnosis not present

## 2023-05-20 DIAGNOSIS — Z992 Dependence on renal dialysis: Secondary | ICD-10-CM | POA: Diagnosis not present

## 2023-05-20 DIAGNOSIS — D631 Anemia in chronic kidney disease: Secondary | ICD-10-CM | POA: Diagnosis not present

## 2023-05-20 DIAGNOSIS — D509 Iron deficiency anemia, unspecified: Secondary | ICD-10-CM | POA: Diagnosis not present

## 2023-05-20 DIAGNOSIS — N250 Renal osteodystrophy: Secondary | ICD-10-CM | POA: Diagnosis not present

## 2023-05-23 DIAGNOSIS — D509 Iron deficiency anemia, unspecified: Secondary | ICD-10-CM | POA: Diagnosis not present

## 2023-05-23 DIAGNOSIS — N186 End stage renal disease: Secondary | ICD-10-CM | POA: Diagnosis not present

## 2023-05-23 DIAGNOSIS — Z992 Dependence on renal dialysis: Secondary | ICD-10-CM | POA: Diagnosis not present

## 2023-05-23 DIAGNOSIS — D631 Anemia in chronic kidney disease: Secondary | ICD-10-CM | POA: Diagnosis not present

## 2023-05-23 DIAGNOSIS — N250 Renal osteodystrophy: Secondary | ICD-10-CM | POA: Diagnosis not present

## 2023-05-24 ENCOUNTER — Other Ambulatory Visit: Payer: Self-pay | Admitting: Cardiovascular Disease

## 2023-05-24 DIAGNOSIS — Z992 Dependence on renal dialysis: Secondary | ICD-10-CM | POA: Diagnosis not present

## 2023-05-24 DIAGNOSIS — N186 End stage renal disease: Secondary | ICD-10-CM | POA: Diagnosis not present

## 2023-05-24 DIAGNOSIS — I480 Paroxysmal atrial fibrillation: Secondary | ICD-10-CM

## 2023-05-25 DIAGNOSIS — D631 Anemia in chronic kidney disease: Secondary | ICD-10-CM | POA: Diagnosis not present

## 2023-05-25 DIAGNOSIS — D509 Iron deficiency anemia, unspecified: Secondary | ICD-10-CM | POA: Diagnosis not present

## 2023-05-25 DIAGNOSIS — N186 End stage renal disease: Secondary | ICD-10-CM | POA: Diagnosis not present

## 2023-05-25 DIAGNOSIS — Z992 Dependence on renal dialysis: Secondary | ICD-10-CM | POA: Diagnosis not present

## 2023-05-25 DIAGNOSIS — N250 Renal osteodystrophy: Secondary | ICD-10-CM | POA: Diagnosis not present

## 2023-05-26 DIAGNOSIS — E1122 Type 2 diabetes mellitus with diabetic chronic kidney disease: Secondary | ICD-10-CM | POA: Diagnosis not present

## 2023-05-26 DIAGNOSIS — N186 End stage renal disease: Secondary | ICD-10-CM | POA: Diagnosis not present

## 2023-05-26 DIAGNOSIS — I5022 Chronic systolic (congestive) heart failure: Secondary | ICD-10-CM | POA: Diagnosis not present

## 2023-05-26 DIAGNOSIS — S72141G Displaced intertrochanteric fracture of right femur, subsequent encounter for closed fracture with delayed healing: Secondary | ICD-10-CM | POA: Diagnosis not present

## 2023-05-26 DIAGNOSIS — D631 Anemia in chronic kidney disease: Secondary | ICD-10-CM | POA: Diagnosis not present

## 2023-05-26 DIAGNOSIS — I132 Hypertensive heart and chronic kidney disease with heart failure and with stage 5 chronic kidney disease, or end stage renal disease: Secondary | ICD-10-CM | POA: Diagnosis not present

## 2023-05-27 DIAGNOSIS — N250 Renal osteodystrophy: Secondary | ICD-10-CM | POA: Diagnosis not present

## 2023-05-27 DIAGNOSIS — N186 End stage renal disease: Secondary | ICD-10-CM | POA: Diagnosis not present

## 2023-05-27 DIAGNOSIS — D509 Iron deficiency anemia, unspecified: Secondary | ICD-10-CM | POA: Diagnosis not present

## 2023-05-27 DIAGNOSIS — D631 Anemia in chronic kidney disease: Secondary | ICD-10-CM | POA: Diagnosis not present

## 2023-05-27 DIAGNOSIS — Z992 Dependence on renal dialysis: Secondary | ICD-10-CM | POA: Diagnosis not present

## 2023-05-30 DIAGNOSIS — I5022 Chronic systolic (congestive) heart failure: Secondary | ICD-10-CM | POA: Diagnosis not present

## 2023-05-30 DIAGNOSIS — D509 Iron deficiency anemia, unspecified: Secondary | ICD-10-CM | POA: Diagnosis not present

## 2023-05-30 DIAGNOSIS — Z87891 Personal history of nicotine dependence: Secondary | ICD-10-CM | POA: Diagnosis not present

## 2023-05-30 DIAGNOSIS — I951 Orthostatic hypotension: Secondary | ICD-10-CM | POA: Diagnosis not present

## 2023-05-30 DIAGNOSIS — E1122 Type 2 diabetes mellitus with diabetic chronic kidney disease: Secondary | ICD-10-CM | POA: Diagnosis not present

## 2023-05-30 DIAGNOSIS — Z96641 Presence of right artificial hip joint: Secondary | ICD-10-CM | POA: Diagnosis not present

## 2023-05-30 DIAGNOSIS — G4700 Insomnia, unspecified: Secondary | ICD-10-CM | POA: Diagnosis not present

## 2023-05-30 DIAGNOSIS — D631 Anemia in chronic kidney disease: Secondary | ICD-10-CM | POA: Diagnosis not present

## 2023-05-30 DIAGNOSIS — N250 Renal osteodystrophy: Secondary | ICD-10-CM | POA: Diagnosis not present

## 2023-05-30 DIAGNOSIS — I480 Paroxysmal atrial fibrillation: Secondary | ICD-10-CM | POA: Diagnosis not present

## 2023-05-30 DIAGNOSIS — S72141G Displaced intertrochanteric fracture of right femur, subsequent encounter for closed fracture with delayed healing: Secondary | ICD-10-CM | POA: Diagnosis not present

## 2023-05-30 DIAGNOSIS — K219 Gastro-esophageal reflux disease without esophagitis: Secondary | ICD-10-CM | POA: Diagnosis not present

## 2023-05-30 DIAGNOSIS — Z951 Presence of aortocoronary bypass graft: Secondary | ICD-10-CM | POA: Diagnosis not present

## 2023-05-30 DIAGNOSIS — I7121 Aneurysm of the ascending aorta, without rupture: Secondary | ICD-10-CM | POA: Diagnosis not present

## 2023-05-30 DIAGNOSIS — I2510 Atherosclerotic heart disease of native coronary artery without angina pectoris: Secondary | ICD-10-CM | POA: Diagnosis not present

## 2023-05-30 DIAGNOSIS — Z7984 Long term (current) use of oral hypoglycemic drugs: Secondary | ICD-10-CM | POA: Diagnosis not present

## 2023-05-30 DIAGNOSIS — N186 End stage renal disease: Secondary | ICD-10-CM | POA: Diagnosis not present

## 2023-05-30 DIAGNOSIS — E785 Hyperlipidemia, unspecified: Secondary | ICD-10-CM | POA: Diagnosis not present

## 2023-05-30 DIAGNOSIS — I132 Hypertensive heart and chronic kidney disease with heart failure and with stage 5 chronic kidney disease, or end stage renal disease: Secondary | ICD-10-CM | POA: Diagnosis not present

## 2023-05-30 DIAGNOSIS — Z992 Dependence on renal dialysis: Secondary | ICD-10-CM | POA: Diagnosis not present

## 2023-05-30 DIAGNOSIS — I429 Cardiomyopathy, unspecified: Secondary | ICD-10-CM | POA: Diagnosis not present

## 2023-05-30 DIAGNOSIS — Z9181 History of falling: Secondary | ICD-10-CM | POA: Diagnosis not present

## 2023-05-30 DIAGNOSIS — E039 Hypothyroidism, unspecified: Secondary | ICD-10-CM | POA: Diagnosis not present

## 2023-05-30 DIAGNOSIS — Z7902 Long term (current) use of antithrombotics/antiplatelets: Secondary | ICD-10-CM | POA: Diagnosis not present

## 2023-05-31 DIAGNOSIS — S72141G Displaced intertrochanteric fracture of right femur, subsequent encounter for closed fracture with delayed healing: Secondary | ICD-10-CM | POA: Diagnosis not present

## 2023-05-31 DIAGNOSIS — I5022 Chronic systolic (congestive) heart failure: Secondary | ICD-10-CM | POA: Diagnosis not present

## 2023-05-31 DIAGNOSIS — I132 Hypertensive heart and chronic kidney disease with heart failure and with stage 5 chronic kidney disease, or end stage renal disease: Secondary | ICD-10-CM | POA: Diagnosis not present

## 2023-05-31 DIAGNOSIS — N186 End stage renal disease: Secondary | ICD-10-CM | POA: Diagnosis not present

## 2023-05-31 DIAGNOSIS — E1122 Type 2 diabetes mellitus with diabetic chronic kidney disease: Secondary | ICD-10-CM | POA: Diagnosis not present

## 2023-05-31 DIAGNOSIS — D631 Anemia in chronic kidney disease: Secondary | ICD-10-CM | POA: Diagnosis not present

## 2023-06-01 DIAGNOSIS — N186 End stage renal disease: Secondary | ICD-10-CM | POA: Diagnosis not present

## 2023-06-01 DIAGNOSIS — D509 Iron deficiency anemia, unspecified: Secondary | ICD-10-CM | POA: Diagnosis not present

## 2023-06-01 DIAGNOSIS — N250 Renal osteodystrophy: Secondary | ICD-10-CM | POA: Diagnosis not present

## 2023-06-01 DIAGNOSIS — Z992 Dependence on renal dialysis: Secondary | ICD-10-CM | POA: Diagnosis not present

## 2023-06-01 DIAGNOSIS — D631 Anemia in chronic kidney disease: Secondary | ICD-10-CM | POA: Diagnosis not present

## 2023-06-03 DIAGNOSIS — N186 End stage renal disease: Secondary | ICD-10-CM | POA: Diagnosis not present

## 2023-06-03 DIAGNOSIS — Z992 Dependence on renal dialysis: Secondary | ICD-10-CM | POA: Diagnosis not present

## 2023-06-03 DIAGNOSIS — N250 Renal osteodystrophy: Secondary | ICD-10-CM | POA: Diagnosis not present

## 2023-06-03 DIAGNOSIS — D509 Iron deficiency anemia, unspecified: Secondary | ICD-10-CM | POA: Diagnosis not present

## 2023-06-03 DIAGNOSIS — D631 Anemia in chronic kidney disease: Secondary | ICD-10-CM | POA: Diagnosis not present

## 2023-06-06 DIAGNOSIS — E119 Type 2 diabetes mellitus without complications: Secondary | ICD-10-CM | POA: Diagnosis not present

## 2023-06-06 DIAGNOSIS — N186 End stage renal disease: Secondary | ICD-10-CM | POA: Diagnosis not present

## 2023-06-06 DIAGNOSIS — Z992 Dependence on renal dialysis: Secondary | ICD-10-CM | POA: Diagnosis not present

## 2023-06-06 DIAGNOSIS — D509 Iron deficiency anemia, unspecified: Secondary | ICD-10-CM | POA: Diagnosis not present

## 2023-06-06 DIAGNOSIS — N250 Renal osteodystrophy: Secondary | ICD-10-CM | POA: Diagnosis not present

## 2023-06-06 DIAGNOSIS — D631 Anemia in chronic kidney disease: Secondary | ICD-10-CM | POA: Diagnosis not present

## 2023-06-07 DIAGNOSIS — D631 Anemia in chronic kidney disease: Secondary | ICD-10-CM | POA: Diagnosis not present

## 2023-06-07 DIAGNOSIS — E1122 Type 2 diabetes mellitus with diabetic chronic kidney disease: Secondary | ICD-10-CM | POA: Diagnosis not present

## 2023-06-07 DIAGNOSIS — I132 Hypertensive heart and chronic kidney disease with heart failure and with stage 5 chronic kidney disease, or end stage renal disease: Secondary | ICD-10-CM | POA: Diagnosis not present

## 2023-06-07 DIAGNOSIS — S72141G Displaced intertrochanteric fracture of right femur, subsequent encounter for closed fracture with delayed healing: Secondary | ICD-10-CM | POA: Diagnosis not present

## 2023-06-07 DIAGNOSIS — I5022 Chronic systolic (congestive) heart failure: Secondary | ICD-10-CM | POA: Diagnosis not present

## 2023-06-07 DIAGNOSIS — N186 End stage renal disease: Secondary | ICD-10-CM | POA: Diagnosis not present

## 2023-06-08 DIAGNOSIS — D509 Iron deficiency anemia, unspecified: Secondary | ICD-10-CM | POA: Diagnosis not present

## 2023-06-08 DIAGNOSIS — Z992 Dependence on renal dialysis: Secondary | ICD-10-CM | POA: Diagnosis not present

## 2023-06-08 DIAGNOSIS — N250 Renal osteodystrophy: Secondary | ICD-10-CM | POA: Diagnosis not present

## 2023-06-08 DIAGNOSIS — N186 End stage renal disease: Secondary | ICD-10-CM | POA: Diagnosis not present

## 2023-06-08 DIAGNOSIS — D631 Anemia in chronic kidney disease: Secondary | ICD-10-CM | POA: Diagnosis not present

## 2023-06-10 DIAGNOSIS — Z992 Dependence on renal dialysis: Secondary | ICD-10-CM | POA: Diagnosis not present

## 2023-06-10 DIAGNOSIS — N250 Renal osteodystrophy: Secondary | ICD-10-CM | POA: Diagnosis not present

## 2023-06-10 DIAGNOSIS — N186 End stage renal disease: Secondary | ICD-10-CM | POA: Diagnosis not present

## 2023-06-10 DIAGNOSIS — D631 Anemia in chronic kidney disease: Secondary | ICD-10-CM | POA: Diagnosis not present

## 2023-06-10 DIAGNOSIS — D509 Iron deficiency anemia, unspecified: Secondary | ICD-10-CM | POA: Diagnosis not present

## 2023-06-13 DIAGNOSIS — D631 Anemia in chronic kidney disease: Secondary | ICD-10-CM | POA: Diagnosis not present

## 2023-06-13 DIAGNOSIS — N250 Renal osteodystrophy: Secondary | ICD-10-CM | POA: Diagnosis not present

## 2023-06-13 DIAGNOSIS — Z992 Dependence on renal dialysis: Secondary | ICD-10-CM | POA: Diagnosis not present

## 2023-06-13 DIAGNOSIS — D509 Iron deficiency anemia, unspecified: Secondary | ICD-10-CM | POA: Diagnosis not present

## 2023-06-13 DIAGNOSIS — N186 End stage renal disease: Secondary | ICD-10-CM | POA: Diagnosis not present

## 2023-06-14 ENCOUNTER — Encounter: Payer: Self-pay | Admitting: Cardiovascular Disease

## 2023-06-14 ENCOUNTER — Ambulatory Visit (INDEPENDENT_AMBULATORY_CARE_PROVIDER_SITE_OTHER): Payer: Medicare Other | Admitting: Cardiovascular Disease

## 2023-06-14 VITALS — BP 98/64 | HR 68 | Ht 72.0 in | Wt 198.4 lb

## 2023-06-14 DIAGNOSIS — I25810 Atherosclerosis of coronary artery bypass graft(s) without angina pectoris: Secondary | ICD-10-CM

## 2023-06-14 DIAGNOSIS — I429 Cardiomyopathy, unspecified: Secondary | ICD-10-CM

## 2023-06-14 DIAGNOSIS — E1122 Type 2 diabetes mellitus with diabetic chronic kidney disease: Secondary | ICD-10-CM | POA: Diagnosis not present

## 2023-06-14 DIAGNOSIS — E782 Mixed hyperlipidemia: Secondary | ICD-10-CM

## 2023-06-14 DIAGNOSIS — Z992 Dependence on renal dialysis: Secondary | ICD-10-CM | POA: Diagnosis not present

## 2023-06-14 DIAGNOSIS — I132 Hypertensive heart and chronic kidney disease with heart failure and with stage 5 chronic kidney disease, or end stage renal disease: Secondary | ICD-10-CM | POA: Diagnosis not present

## 2023-06-14 DIAGNOSIS — I5022 Chronic systolic (congestive) heart failure: Secondary | ICD-10-CM | POA: Diagnosis not present

## 2023-06-14 DIAGNOSIS — I120 Hypertensive chronic kidney disease with stage 5 chronic kidney disease or end stage renal disease: Secondary | ICD-10-CM

## 2023-06-14 DIAGNOSIS — D631 Anemia in chronic kidney disease: Secondary | ICD-10-CM | POA: Diagnosis not present

## 2023-06-14 DIAGNOSIS — Z0130 Encounter for examination of blood pressure without abnormal findings: Secondary | ICD-10-CM

## 2023-06-14 DIAGNOSIS — I480 Paroxysmal atrial fibrillation: Secondary | ICD-10-CM | POA: Diagnosis not present

## 2023-06-14 DIAGNOSIS — S72141G Displaced intertrochanteric fracture of right femur, subsequent encounter for closed fracture with delayed healing: Secondary | ICD-10-CM | POA: Diagnosis not present

## 2023-06-14 DIAGNOSIS — N186 End stage renal disease: Secondary | ICD-10-CM

## 2023-06-14 NOTE — Progress Notes (Signed)
Cardiology Office Note   Date:  06/14/2023   ID:  Matthew [name], DOB 1931-02-23, MRN [id number]  PCP:  Sherron Monday, MD  Cardiologist:  Adrian Blackwater, MD      History of Present Illness: Matthew [name] is a 89 y.o. male who presents for  Chief Complaint  Patient presents with   Follow-up    2 month follow up     Doing well, no dizziness      Past Medical History:  Diagnosis Date   Anginal pain (HCC)    Arthritis    Ascending aortic aneurysm (HCC) 07/18/2015   a.) TTE 07/18/2015: severe dilation of ascending aorta measuring 7.0 cm. b.) TTE 01/10/2018: aortic root 3.8 cm; ascending aorta 6.8 cm; refused surgical intervention/repair.   Atrial fibrillation (HCC)    a.) CHA2DS2-VASc = 6 (age x 2, HFrEF, HTN, previous MI, T2DM). b.) rate/rhythm maintained on amiodarone + metoprolol succinate; chronic antiplatelet therapy using clopidogrel   Bradycardia    Coronary artery disease    ESRD (end stage renal disease) (HCC)    GERD (gastroesophageal reflux disease)    HFrEF (heart failure with reduced ejection fraction) (HCC)    a.) TTE 12/03/2013: mod dec LV function; EF 35-40%; severe inferolateral and inferior HK; LA dilated; G3DD; PASP 52 mmHg. b.) TTE 07/18/2015: mild LV dysfunction with mild LVH; EF 45%; mild BAE. c.) TTE 01/10/2018: mod LV dysfunction; EF 45%; diffuse HK   HLD (hyperlipidemia)    Hypertension    IDA (iron deficiency anemia)    Long term current use of antithrombotics/antiplatelets    a.) clopidogrel   Myocardial infarction (HCC) 2000   Pneumonia    S/P CABG x 4 2001   a.) LIMA-LAD, SVG-D1, SVG-OM1, SVG-OM2, SVG-PDA   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   left frontal scalp, EDC   Squamous cell carcinoma of skin 05/05/2020   left temporal scalp, in situ, EDC 05/13/20   T2DM (type 2 diabetes mellitus) (HCC)    Valvular insufficiency 12/03/2013   a.) TTE 12/03/2013: EF 35-40%; mild AR/MR, b.) TTE 07/18/2015: EF 45%; mild AR/PR, mod TR,  severe MR. c.) TTE 01/10/2018: mod AR/TR     Past Surgical History:  Procedure Laterality Date   A/V FISTULAGRAM Left 02/09/2023   Procedure: A/V Fistulagram;  Surgeon: Annice Needy, MD;  Location: [facility];  Service: Cardiovascular;  Laterality: Left;   A/V SHUNT INTERVENTION N/A 01/25/2023   Procedure: A/V SHUNT INTERVENTION;  Surgeon: Renford Dills, MD;  Location: [facility];  Service: Cardiovascular;  Laterality: N/A;   A/V SHUNTOGRAM Left 04/01/2022   Procedure: A/V SHUNTOGRAM;  Surgeon: Annice Needy, MD;  Location: [facility];  Service: Cardiovascular;  Laterality: Left;   AV FISTULA PLACEMENT Left 04/15/2021   Procedure: INSERTION OF ARTERIOVENOUS (AV) GORE-TEX GRAFT ARM ( BRACHIAL AXILLARY);  Surgeon: Annice Needy, MD;  Location: [facility];  Service: Vascular;  Laterality: Left;   CATARACT EXTRACTION W/PHACO Left 07/26/2017   Procedure: CATARACT EXTRACTION PHACO AND INTRAOCULAR LENS PLACEMENT (IOC);  Surgeon: Galen Manila, MD;  Location: [facility];  Service: Ophthalmology;  Laterality: Left;  [place]   00:45.8 AP%  13.1 CDE  6.00 Fluid Pack Lot # [id number]   CATARACT EXTRACTION W/PHACO Right 08/17/2017   Procedure: CATARACT EXTRACTION PHACO AND INTRAOCULAR LENS PLACEMENT (IOC);  Surgeon: Galen Manila, MD;  Location: [facility];  Service: Ophthalmology;  Laterality: Right;  [place]  01:30 AP% 16.8 CDE 15.24  Fluid pack lot # [id number]   CHOLECYSTECTOMY     CORONARY ARTERY BYPASS GRAFT N/A 2001   Procedure: 5v CABG (LIMA-LAD, SVG-D1, SVG-OM1, SVG-OM2, SVG-PDA)   DIALYSIS/PERMA CATHETER INSERTION N/A 02/09/2018   Procedure: DIALYSIS/PERMA CATHETER INSERTION;  Surgeon: Annice Needy, MD;  Location: [facility];  Service: Cardiovascular;  Laterality: N/A;   DIALYSIS/PERMA CATHETER INSERTION N/A 02/09/2023   Procedure: DIALYSIS/PERMA CATHETER INSERTION;  Surgeon: Annice Needy, MD;  Location: [facility];  Service: Cardiovascular;  Laterality:  N/A;   DIALYSIS/PERMA CATHETER REMOVAL N/A 06/25/2021   Procedure: DIALYSIS/PERMA CATHETER REMOVAL;  Surgeon: Annice Needy, MD;  Location: [facility];  Service: Cardiovascular;  Laterality: N/A;   FRACTURE SURGERY Left 2015   LEFT arm   INSERTION OF DIALYSIS CATHETER Right 11/03/2020   Procedure: INSERTION OF Perm Cath in the RIGHT INTERNAL JUGULAR;  Surgeon: Annice Needy, MD;  Location: [facility];  Service: Vascular;  Laterality: Right;   INTRAMEDULLARY (IM) NAIL INTERTROCHANTERIC Right 11/12/2022   Procedure: INTRAMEDULLARY (IM) NAIL INTERTROCHANTERIC;  Surgeon: Juanell Fairly, MD;  Location: [facility];  Service: Orthopedics;  Laterality: Right;   LEFT HEART CATHETERIZATION WITH CORONARY/GRAFT ANGIOGRAM Left 12/04/2013   Procedure: LEFT HEART CATHETERIZATION WITH Isabel Caprice;  Surgeon: Marykay Lex, MD;  Location: [facility];  Service: Cardiovascular;  Laterality: Left;   LEFT HEART CATHETERIZATION WITH CORONARY/GRAFT ANGIOGRAM Left 02/06/2009   Procedure: LEFT HEART CATHETERIZATION WITH CORONARY/GRAFT ANGIOGRAM; Location: [facility]; Surgeon: Despina Hick, MD   PERCUTANEOUS CORONARY STENT INTERVENTION (PCI-S) N/A 12/06/2013   Procedure: STAGED PERCUTANEOUS CORONARY STENT INTERVENTION (overlapping 4.0 x 38 mm (mid) and 4.0 x 28 mm (ostial) Promus Primier DES to SVG-RPDA graft);  Surgeon: Lesleigh Noe, MD;  Location: [facility];  Service: Cardiovascular   REMOVAL OF A DIALYSIS CATHETER N/A 11/03/2020   Procedure: REMOVAL OF A DIALYSIS CATHETER;  Surgeon: Annice Needy, MD;  Location: [facility];  Service: Vascular;  Laterality: N/A;   TEMPORARY DIALYSIS CATHETER N/A 02/04/2023   Procedure: TEMPORARY DIALYSIS CATHETER;  Surgeon: Renford Dills, MD;  Location: [facility];  Service: Cardiovascular;  Laterality: N/A;   TOTAL HIP REVISION Right 01/12/2023   Procedure: CONVERSION TO  RIGHT TOTAL HIP REPLACEMENT;  Surgeon: Joen Laura, MD;  Location: [facility];   Service: Orthopedics;  Laterality: Right;     Current Outpatient Medications  Medication Sig Dispense Refill   aspirin EC 81 MG tablet Take 81 mg by mouth daily. Swallow whole.     cetirizine (ZYRTEC) 10 MG tablet Take 10 mg by mouth daily.     cholecalciferol (VITAMIN D) 25 MCG (1000 UNIT) tablet Take 1,000 Units by mouth daily with breakfast.     clopidogrel (PLAVIX) 75 MG tablet TAKE 1 TABLET BY MOUTH DAILY 90 tablet 3   Continuous Glucose Receiver (FREESTYLE LIBRE 3 READER) DEVI 1 Device by Does not apply route daily. 1 each 0   Continuous Glucose Sensor (FREESTYLE LIBRE 3 SENSOR) MISC 1 patch by Does not apply route as directed. Place 1 sensor on the skin every 14 days. Use to check glucose continuously 2 each 11   ferrous sulfate 325 (65 FE) MG tablet Take 325 mg by mouth 2 (two) times daily with a meal.      fluticasone (FLONASE) 50 MCG/ACT nasal spray Place 2 sprays into both nostrils daily as needed for allergies or rhinitis.     glipiZIDE 2.5 MG TABS Take 1 tablet  by mouth AC breakfast. 90 tablet 1   levothyroxine (SYNTHROID) 100 MCG tablet Take 100 mcg by mouth daily before breakfast.     lidocaine-prilocaine (EMLA) cream Apply 1 Application topically as needed (before dialysis).     multivitamin (RENA-VIT) TABS tablet Take 1 tablet by mouth daily.     nitroGLYCERIN (NITROSTAT) 0.4 MG SL tablet DISSOLVE 1 TABLET UNDER THE  TONGUE EVERY 5 MINUTES AS NEEDED FOR CHEST PAIN. MAX OF 3 TABLETS IN 15 MINUTES. CALL 911 IF PAIN  PERSISTS. 100 tablet 3   Omega-3 Fatty Acids (FISH OIL) 1000 MG CAPS Take 4,000 mg by mouth daily with lunch.     polyethylene glycol (MIRALAX / GLYCOLAX) 17 g packet Take 17 g by mouth daily. 14 each 0   rosuvastatin (CRESTOR) 40 MG tablet TAKE 1 TABLET BY MOUTH ONCE  DAILY 90 tablet 3   senna-docusate (SENOKOT-S) 8.6-50 MG tablet Take 2 tablets by mouth 2 (two) times daily. 30 tablet 0   midodrine (PROAMATINE) 5 MG tablet Take 1 tablet (5 mg total) by mouth 3  (three) times daily with meals. (Patient taking differently: Take 5 mg by mouth 3 (three) times daily with meals. 5MG  1 HOUR BEFORE DIALYSIS)     No current facility-administered medications for this visit.    Allergies:   Amoxicillin    Social History:   reports that he quit smoking about 33 years ago. His smoking use included pipe and cigarettes. He has never used smokeless tobacco. He reports that he does not currently use alcohol after a past usage of about 1.0 standard drink of alcohol per week. He reports that he does not use drugs.   Family History:  family history includes Heart attack (age of onset: 83) in his father; Heart failure in his mother.    ROS:     Review of Systems  Constitutional: Negative.   HENT: Negative.    Eyes: Negative.   Respiratory: Negative.    Gastrointestinal: Negative.   Genitourinary: Negative.   Musculoskeletal: Negative.   Skin: Negative.   Neurological: Negative.   Endo/Heme/Allergies: Negative.   Psychiatric/Behavioral: Negative.    All other systems reviewed and are negative.     All other systems are reviewed and negative.    PHYSICAL EXAM: VS:  BP 98/64   Pulse 68   Ht 6' (1.829 m)   Wt 198 lb 6.4 oz (90 kg)   SpO2 95%   BMI 26.91 kg/m  , BMI Body mass index is 26.91 kg/m. Last weight:  Wt Readings from Last 3 Encounters:  06/14/23 198 lb 6.4 oz (90 kg)  04/28/23 194 lb 9.6 oz (88.3 kg)  04/26/23 190 lb (86.2 kg)     Physical Exam Vitals reviewed.  Constitutional:      Appearance: Normal appearance. He is normal weight.  HENT:     Head: Normocephalic.     Nose: Nose normal.     Mouth/Throat:     Mouth: Mucous membranes are moist.  Eyes:     Pupils: Pupils are equal, round, and reactive to light.  Cardiovascular:     Rate and Rhythm: Normal rate and regular rhythm.     Pulses: Normal pulses.     Heart sounds: Normal heart sounds.  Pulmonary:     Effort: Pulmonary effort is normal.  Abdominal:     General:  Abdomen is flat. Bowel sounds are normal.  Musculoskeletal:        General: Normal range of motion.  Cervical back: Normal range of motion.  Skin:    General: Skin is warm.  Neurological:     General: No focal deficit present.     Mental Status: He is alert.  Psychiatric:        Mood and Affect: Mood normal.       EKG:   Recent Labs: 01/10/2023: Magnesium 1.9; TSH 4.027 02/04/2023: ALT 10 02/10/2023: BUN 27; Creatinine, Ser 3.72; Hemoglobin 7.9; Platelets 183; Potassium 3.3; Sodium 135    Lipid Panel    Component Value Date/Time   CHOL 158 04/26/2023 1502   TRIG 102 04/26/2023 1502   HDL 69 04/26/2023 1502   CHOLHDL 2.3 04/26/2023 1502   LDLCALC 71 04/26/2023 1502      Other studies Reviewed: Additional studies/ records that were reviewed today include:  Review of the above records demonstrates:       No data to display            ASSESSMENT AND PLAN:    ICD-10-CM   1. 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)  E11.22    I12.0    Z99.2    N18.6     2. Chronic systolic CHF (congestive heart failure) (HCC)  I50.22     3. Mixed hyperlipidemia  E78.2     4. Paroxysmal atrial fibrillation (HCC)  I48.0     5. Coronary artery disease involving coronary bypass graft of native heart without angina pectoris  I25.810    Doing well, no chest pain    6. Cardiomyopathy, unspecified type (HCC)  I42.9        Problem List Items Addressed This Visit       Cardiovascular and Mediastinum   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) - Primary   Atrial fibrillation (HCC)   Cardiomyopathy (HCC)   Coronary artery disease     Other   HLD (hyperlipidemia)   Other Visit Diagnoses       Chronic systolic CHF (congestive heart failure) (HCC)              Disposition:   Return in about 3 months (around 09/12/2023).    Total time spent: 30 minutes  Signed,  Adrian Blackwater, MD  06/14/2023 11:22 AM    [facility]

## 2023-06-15 DIAGNOSIS — N250 Renal osteodystrophy: Secondary | ICD-10-CM | POA: Diagnosis not present

## 2023-06-15 DIAGNOSIS — Z992 Dependence on renal dialysis: Secondary | ICD-10-CM | POA: Diagnosis not present

## 2023-06-15 DIAGNOSIS — N186 End stage renal disease: Secondary | ICD-10-CM | POA: Diagnosis not present

## 2023-06-15 DIAGNOSIS — D509 Iron deficiency anemia, unspecified: Secondary | ICD-10-CM | POA: Diagnosis not present

## 2023-06-15 DIAGNOSIS — D631 Anemia in chronic kidney disease: Secondary | ICD-10-CM | POA: Diagnosis not present

## 2023-06-17 DIAGNOSIS — D631 Anemia in chronic kidney disease: Secondary | ICD-10-CM | POA: Diagnosis not present

## 2023-06-17 DIAGNOSIS — D509 Iron deficiency anemia, unspecified: Secondary | ICD-10-CM | POA: Diagnosis not present

## 2023-06-17 DIAGNOSIS — Z992 Dependence on renal dialysis: Secondary | ICD-10-CM | POA: Diagnosis not present

## 2023-06-17 DIAGNOSIS — N186 End stage renal disease: Secondary | ICD-10-CM | POA: Diagnosis not present

## 2023-06-17 DIAGNOSIS — N250 Renal osteodystrophy: Secondary | ICD-10-CM | POA: Diagnosis not present

## 2023-06-20 DIAGNOSIS — N186 End stage renal disease: Secondary | ICD-10-CM | POA: Diagnosis not present

## 2023-06-20 DIAGNOSIS — Z992 Dependence on renal dialysis: Secondary | ICD-10-CM | POA: Diagnosis not present

## 2023-06-20 DIAGNOSIS — D509 Iron deficiency anemia, unspecified: Secondary | ICD-10-CM | POA: Diagnosis not present

## 2023-06-20 DIAGNOSIS — N250 Renal osteodystrophy: Secondary | ICD-10-CM | POA: Diagnosis not present

## 2023-06-20 DIAGNOSIS — D631 Anemia in chronic kidney disease: Secondary | ICD-10-CM | POA: Diagnosis not present

## 2023-06-22 DIAGNOSIS — D509 Iron deficiency anemia, unspecified: Secondary | ICD-10-CM | POA: Diagnosis not present

## 2023-06-22 DIAGNOSIS — Z992 Dependence on renal dialysis: Secondary | ICD-10-CM | POA: Diagnosis not present

## 2023-06-22 DIAGNOSIS — N186 End stage renal disease: Secondary | ICD-10-CM | POA: Diagnosis not present

## 2023-06-22 DIAGNOSIS — N250 Renal osteodystrophy: Secondary | ICD-10-CM | POA: Diagnosis not present

## 2023-06-22 DIAGNOSIS — D631 Anemia in chronic kidney disease: Secondary | ICD-10-CM | POA: Diagnosis not present

## 2023-06-24 DIAGNOSIS — N250 Renal osteodystrophy: Secondary | ICD-10-CM | POA: Diagnosis not present

## 2023-06-24 DIAGNOSIS — D631 Anemia in chronic kidney disease: Secondary | ICD-10-CM | POA: Diagnosis not present

## 2023-06-24 DIAGNOSIS — Z992 Dependence on renal dialysis: Secondary | ICD-10-CM | POA: Diagnosis not present

## 2023-06-24 DIAGNOSIS — D509 Iron deficiency anemia, unspecified: Secondary | ICD-10-CM | POA: Diagnosis not present

## 2023-06-24 DIAGNOSIS — N186 End stage renal disease: Secondary | ICD-10-CM | POA: Diagnosis not present

## 2023-06-27 DIAGNOSIS — D631 Anemia in chronic kidney disease: Secondary | ICD-10-CM | POA: Diagnosis not present

## 2023-06-27 DIAGNOSIS — N250 Renal osteodystrophy: Secondary | ICD-10-CM | POA: Diagnosis not present

## 2023-06-27 DIAGNOSIS — N186 End stage renal disease: Secondary | ICD-10-CM | POA: Diagnosis not present

## 2023-06-27 DIAGNOSIS — Z992 Dependence on renal dialysis: Secondary | ICD-10-CM | POA: Diagnosis not present

## 2023-06-27 DIAGNOSIS — D509 Iron deficiency anemia, unspecified: Secondary | ICD-10-CM | POA: Diagnosis not present

## 2023-06-28 ENCOUNTER — Telehealth: Payer: Self-pay

## 2023-06-28 DIAGNOSIS — E1122 Type 2 diabetes mellitus with diabetic chronic kidney disease: Secondary | ICD-10-CM | POA: Diagnosis not present

## 2023-06-28 DIAGNOSIS — D631 Anemia in chronic kidney disease: Secondary | ICD-10-CM | POA: Diagnosis not present

## 2023-06-28 DIAGNOSIS — S72141G Displaced intertrochanteric fracture of right femur, subsequent encounter for closed fracture with delayed healing: Secondary | ICD-10-CM | POA: Diagnosis not present

## 2023-06-28 DIAGNOSIS — I5022 Chronic systolic (congestive) heart failure: Secondary | ICD-10-CM | POA: Diagnosis not present

## 2023-06-28 DIAGNOSIS — I132 Hypertensive heart and chronic kidney disease with heart failure and with stage 5 chronic kidney disease, or end stage renal disease: Secondary | ICD-10-CM | POA: Diagnosis not present

## 2023-06-28 DIAGNOSIS — N186 End stage renal disease: Secondary | ICD-10-CM | POA: Diagnosis not present

## 2023-06-28 NOTE — Telephone Encounter (Signed)
PT Certification request from [facility],   1 week 8

## 2023-06-29 DIAGNOSIS — N186 End stage renal disease: Secondary | ICD-10-CM | POA: Diagnosis not present

## 2023-06-29 DIAGNOSIS — Z7984 Long term (current) use of oral hypoglycemic drugs: Secondary | ICD-10-CM | POA: Diagnosis not present

## 2023-06-29 DIAGNOSIS — I951 Orthostatic hypotension: Secondary | ICD-10-CM | POA: Diagnosis not present

## 2023-06-29 DIAGNOSIS — E1122 Type 2 diabetes mellitus with diabetic chronic kidney disease: Secondary | ICD-10-CM | POA: Diagnosis not present

## 2023-06-29 DIAGNOSIS — Z96641 Presence of right artificial hip joint: Secondary | ICD-10-CM | POA: Diagnosis not present

## 2023-06-29 DIAGNOSIS — I2510 Atherosclerotic heart disease of native coronary artery without angina pectoris: Secondary | ICD-10-CM | POA: Diagnosis not present

## 2023-06-29 DIAGNOSIS — K219 Gastro-esophageal reflux disease without esophagitis: Secondary | ICD-10-CM | POA: Diagnosis not present

## 2023-06-29 DIAGNOSIS — I429 Cardiomyopathy, unspecified: Secondary | ICD-10-CM | POA: Diagnosis not present

## 2023-06-29 DIAGNOSIS — Z992 Dependence on renal dialysis: Secondary | ICD-10-CM | POA: Diagnosis not present

## 2023-06-29 DIAGNOSIS — I7121 Aneurysm of the ascending aorta, without rupture: Secondary | ICD-10-CM | POA: Diagnosis not present

## 2023-06-29 DIAGNOSIS — S72141G Displaced intertrochanteric fracture of right femur, subsequent encounter for closed fracture with delayed healing: Secondary | ICD-10-CM | POA: Diagnosis not present

## 2023-06-29 DIAGNOSIS — E039 Hypothyroidism, unspecified: Secondary | ICD-10-CM | POA: Diagnosis not present

## 2023-06-29 DIAGNOSIS — Z9181 History of falling: Secondary | ICD-10-CM | POA: Diagnosis not present

## 2023-06-29 DIAGNOSIS — D631 Anemia in chronic kidney disease: Secondary | ICD-10-CM | POA: Diagnosis not present

## 2023-06-29 DIAGNOSIS — Z7902 Long term (current) use of antithrombotics/antiplatelets: Secondary | ICD-10-CM | POA: Diagnosis not present

## 2023-06-29 DIAGNOSIS — Z87891 Personal history of nicotine dependence: Secondary | ICD-10-CM | POA: Diagnosis not present

## 2023-06-29 DIAGNOSIS — Z951 Presence of aortocoronary bypass graft: Secondary | ICD-10-CM | POA: Diagnosis not present

## 2023-06-29 DIAGNOSIS — E785 Hyperlipidemia, unspecified: Secondary | ICD-10-CM | POA: Diagnosis not present

## 2023-06-29 DIAGNOSIS — I480 Paroxysmal atrial fibrillation: Secondary | ICD-10-CM | POA: Diagnosis not present

## 2023-06-29 DIAGNOSIS — I132 Hypertensive heart and chronic kidney disease with heart failure and with stage 5 chronic kidney disease, or end stage renal disease: Secondary | ICD-10-CM | POA: Diagnosis not present

## 2023-06-29 DIAGNOSIS — N250 Renal osteodystrophy: Secondary | ICD-10-CM | POA: Diagnosis not present

## 2023-06-29 DIAGNOSIS — D509 Iron deficiency anemia, unspecified: Secondary | ICD-10-CM | POA: Diagnosis not present

## 2023-06-29 DIAGNOSIS — I5022 Chronic systolic (congestive) heart failure: Secondary | ICD-10-CM | POA: Diagnosis not present

## 2023-06-29 DIAGNOSIS — G4700 Insomnia, unspecified: Secondary | ICD-10-CM | POA: Diagnosis not present

## 2023-06-30 ENCOUNTER — Other Ambulatory Visit: Payer: Medicare Other

## 2023-06-30 DIAGNOSIS — E782 Mixed hyperlipidemia: Secondary | ICD-10-CM | POA: Diagnosis not present

## 2023-06-30 DIAGNOSIS — E039 Hypothyroidism, unspecified: Secondary | ICD-10-CM | POA: Diagnosis not present

## 2023-06-30 DIAGNOSIS — Z992 Dependence on renal dialysis: Secondary | ICD-10-CM | POA: Diagnosis not present

## 2023-06-30 DIAGNOSIS — I120 Hypertensive chronic kidney disease with stage 5 chronic kidney disease or end stage renal disease: Secondary | ICD-10-CM | POA: Diagnosis not present

## 2023-06-30 DIAGNOSIS — I5022 Chronic systolic (congestive) heart failure: Secondary | ICD-10-CM | POA: Diagnosis not present

## 2023-06-30 DIAGNOSIS — E1122 Type 2 diabetes mellitus with diabetic chronic kidney disease: Secondary | ICD-10-CM

## 2023-06-30 DIAGNOSIS — N186 End stage renal disease: Secondary | ICD-10-CM | POA: Diagnosis not present

## 2023-07-01 DIAGNOSIS — Z992 Dependence on renal dialysis: Secondary | ICD-10-CM | POA: Diagnosis not present

## 2023-07-01 DIAGNOSIS — D631 Anemia in chronic kidney disease: Secondary | ICD-10-CM | POA: Diagnosis not present

## 2023-07-01 DIAGNOSIS — N250 Renal osteodystrophy: Secondary | ICD-10-CM | POA: Diagnosis not present

## 2023-07-01 DIAGNOSIS — N186 End stage renal disease: Secondary | ICD-10-CM | POA: Diagnosis not present

## 2023-07-01 DIAGNOSIS — D509 Iron deficiency anemia, unspecified: Secondary | ICD-10-CM | POA: Diagnosis not present

## 2023-07-01 LAB — CMP14+EGFR
ALT: 45 [IU]/L — ABNORMAL HIGH (ref 0–44)
AST: 41 [IU]/L — ABNORMAL HIGH (ref 0–40)
Albumin: 4.1 g/dL (ref 3.6–4.6)
Alkaline Phosphatase: 246 [IU]/L — ABNORMAL HIGH (ref 44–121)
BUN/Creatinine Ratio: 16 (ref 10–24)
BUN: 50 mg/dL — ABNORMAL HIGH (ref 10–36)
Bilirubin Total: 0.7 mg/dL (ref 0.0–1.2)
CO2: 25 mmol/L (ref 20–29)
Calcium: 9.7 mg/dL (ref 8.6–10.2)
Chloride: 96 mmol/L (ref 96–106)
Creatinine, Ser: 3.06 mg/dL — ABNORMAL HIGH (ref 0.76–1.27)
Globulin, Total: 3.1 g/dL (ref 1.5–4.5)
Glucose: 106 mg/dL — ABNORMAL HIGH (ref 70–99)
Potassium: 4.8 mmol/L (ref 3.5–5.2)
Sodium: 140 mmol/L (ref 134–144)
Total Protein: 7.2 g/dL (ref 6.0–8.5)
eGFR: 19 mL/min/{1.73_m2} — ABNORMAL LOW (ref >59)

## 2023-07-01 LAB — LIPID PANEL
Chol/HDL Ratio: 2 {ratio} (ref 0.0–5.0)
Cholesterol, Total: 131 mg/dL (ref 100–199)
HDL: 64 mg/dL (ref >39)
LDL Chol Calc (NIH): 51 mg/dL (ref 0–99)
Triglycerides: 84 mg/dL (ref 0–149)
VLDL Cholesterol Cal: 16 mg/dL (ref 5–40)

## 2023-07-01 LAB — HEMOGLOBIN A1C
Est. average glucose Bld gHb Est-mCnc: 131 mg/dL
Hgb A1c MFr Bld: 6.2 % — ABNORMAL HIGH (ref 4.8–5.6)

## 2023-07-01 LAB — TSH: TSH: 3.47 u[IU]/mL (ref 0.450–4.500)

## 2023-07-04 DIAGNOSIS — Z992 Dependence on renal dialysis: Secondary | ICD-10-CM | POA: Diagnosis not present

## 2023-07-04 DIAGNOSIS — N186 End stage renal disease: Secondary | ICD-10-CM | POA: Diagnosis not present

## 2023-07-04 DIAGNOSIS — N250 Renal osteodystrophy: Secondary | ICD-10-CM | POA: Diagnosis not present

## 2023-07-04 DIAGNOSIS — D509 Iron deficiency anemia, unspecified: Secondary | ICD-10-CM | POA: Diagnosis not present

## 2023-07-04 DIAGNOSIS — D631 Anemia in chronic kidney disease: Secondary | ICD-10-CM | POA: Diagnosis not present

## 2023-07-05 ENCOUNTER — Encounter: Payer: Self-pay | Admitting: Internal Medicine

## 2023-07-05 DIAGNOSIS — D631 Anemia in chronic kidney disease: Secondary | ICD-10-CM | POA: Diagnosis not present

## 2023-07-05 DIAGNOSIS — I5022 Chronic systolic (congestive) heart failure: Secondary | ICD-10-CM | POA: Diagnosis not present

## 2023-07-05 DIAGNOSIS — S72141G Displaced intertrochanteric fracture of right femur, subsequent encounter for closed fracture with delayed healing: Secondary | ICD-10-CM | POA: Diagnosis not present

## 2023-07-05 DIAGNOSIS — N186 End stage renal disease: Secondary | ICD-10-CM | POA: Diagnosis not present

## 2023-07-05 DIAGNOSIS — E1122 Type 2 diabetes mellitus with diabetic chronic kidney disease: Secondary | ICD-10-CM | POA: Diagnosis not present

## 2023-07-05 DIAGNOSIS — I132 Hypertensive heart and chronic kidney disease with heart failure and with stage 5 chronic kidney disease, or end stage renal disease: Secondary | ICD-10-CM | POA: Diagnosis not present

## 2023-07-05 NOTE — Progress Notes (Signed)
Informed via Mychart message

## 2023-07-06 DIAGNOSIS — D631 Anemia in chronic kidney disease: Secondary | ICD-10-CM | POA: Diagnosis not present

## 2023-07-06 DIAGNOSIS — N186 End stage renal disease: Secondary | ICD-10-CM | POA: Diagnosis not present

## 2023-07-06 DIAGNOSIS — N250 Renal osteodystrophy: Secondary | ICD-10-CM | POA: Diagnosis not present

## 2023-07-06 DIAGNOSIS — D509 Iron deficiency anemia, unspecified: Secondary | ICD-10-CM | POA: Diagnosis not present

## 2023-07-06 DIAGNOSIS — Z992 Dependence on renal dialysis: Secondary | ICD-10-CM | POA: Diagnosis not present

## 2023-07-08 DIAGNOSIS — Z992 Dependence on renal dialysis: Secondary | ICD-10-CM | POA: Diagnosis not present

## 2023-07-08 DIAGNOSIS — N250 Renal osteodystrophy: Secondary | ICD-10-CM | POA: Diagnosis not present

## 2023-07-08 DIAGNOSIS — N186 End stage renal disease: Secondary | ICD-10-CM | POA: Diagnosis not present

## 2023-07-08 DIAGNOSIS — D509 Iron deficiency anemia, unspecified: Secondary | ICD-10-CM | POA: Diagnosis not present

## 2023-07-08 DIAGNOSIS — D631 Anemia in chronic kidney disease: Secondary | ICD-10-CM | POA: Diagnosis not present

## 2023-07-11 DIAGNOSIS — D631 Anemia in chronic kidney disease: Secondary | ICD-10-CM | POA: Diagnosis not present

## 2023-07-11 DIAGNOSIS — N250 Renal osteodystrophy: Secondary | ICD-10-CM | POA: Diagnosis not present

## 2023-07-11 DIAGNOSIS — N186 End stage renal disease: Secondary | ICD-10-CM | POA: Diagnosis not present

## 2023-07-11 DIAGNOSIS — D509 Iron deficiency anemia, unspecified: Secondary | ICD-10-CM | POA: Diagnosis not present

## 2023-07-11 DIAGNOSIS — Z992 Dependence on renal dialysis: Secondary | ICD-10-CM | POA: Diagnosis not present

## 2023-07-12 DIAGNOSIS — E1122 Type 2 diabetes mellitus with diabetic chronic kidney disease: Secondary | ICD-10-CM | POA: Diagnosis not present

## 2023-07-12 DIAGNOSIS — I132 Hypertensive heart and chronic kidney disease with heart failure and with stage 5 chronic kidney disease, or end stage renal disease: Secondary | ICD-10-CM | POA: Diagnosis not present

## 2023-07-12 DIAGNOSIS — N186 End stage renal disease: Secondary | ICD-10-CM | POA: Diagnosis not present

## 2023-07-12 DIAGNOSIS — I5022 Chronic systolic (congestive) heart failure: Secondary | ICD-10-CM | POA: Diagnosis not present

## 2023-07-12 DIAGNOSIS — D631 Anemia in chronic kidney disease: Secondary | ICD-10-CM | POA: Diagnosis not present

## 2023-07-12 DIAGNOSIS — S72141G Displaced intertrochanteric fracture of right femur, subsequent encounter for closed fracture with delayed healing: Secondary | ICD-10-CM | POA: Diagnosis not present

## 2023-07-13 IMAGING — CT CT CERVICAL SPINE W/O CM
3 of 4 series · 10 of 33 positions shown, 12 images · non-contrast
Comparison: Head CT 02/15/2018.  Cervical spine CT 01/18/2018.

CLINICAL DATA: Head trauma, minor (Age >= 65y); Neck trauma (Age >=
65y). Fall.

EXAM:
CT HEAD WITHOUT CONTRAST
CT CERVICAL SPINE WITHOUT CONTRAST
TECHNIQUE: Multidetector CT imaging of the head and cervical spine was
performed following the standard protocol without intravenous
contrast. Multiplanar CT image reconstructions of the cervical spine
were also generated.

[Series 6: sagittal bone · sagittal · 0.29mm/px · 5 of 61 slices shown, 6 images]
[im 21/61  bone]
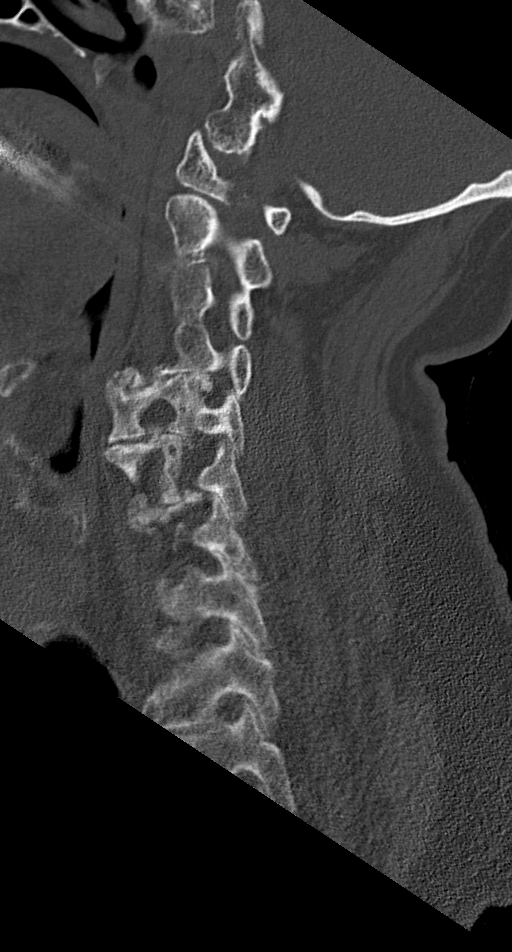
[im 26/61  bone]
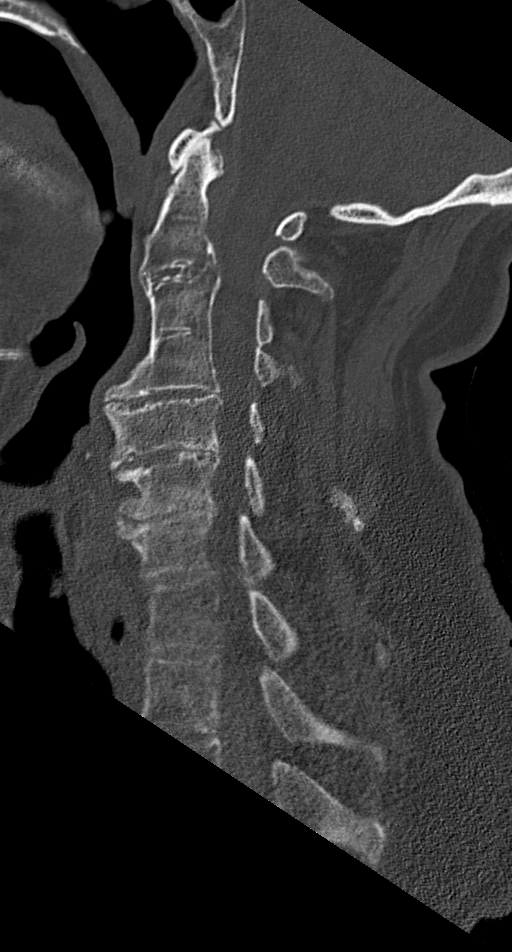
[im 31/61  soft-tissue]
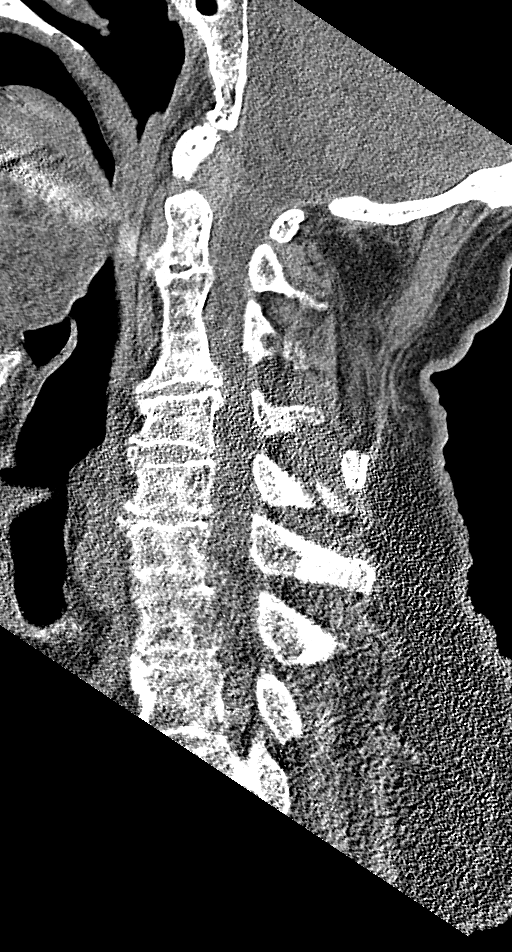
[im 31/61  bone]
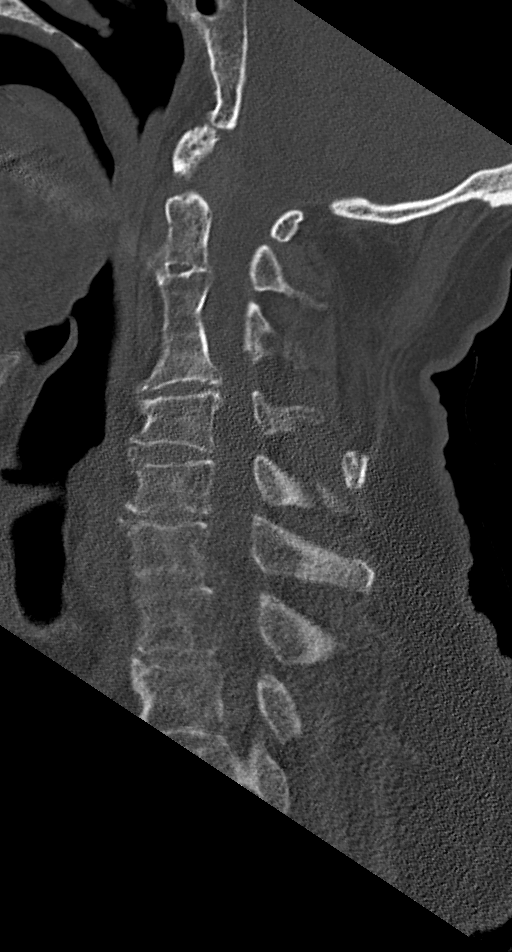
[im 36/61  bone]
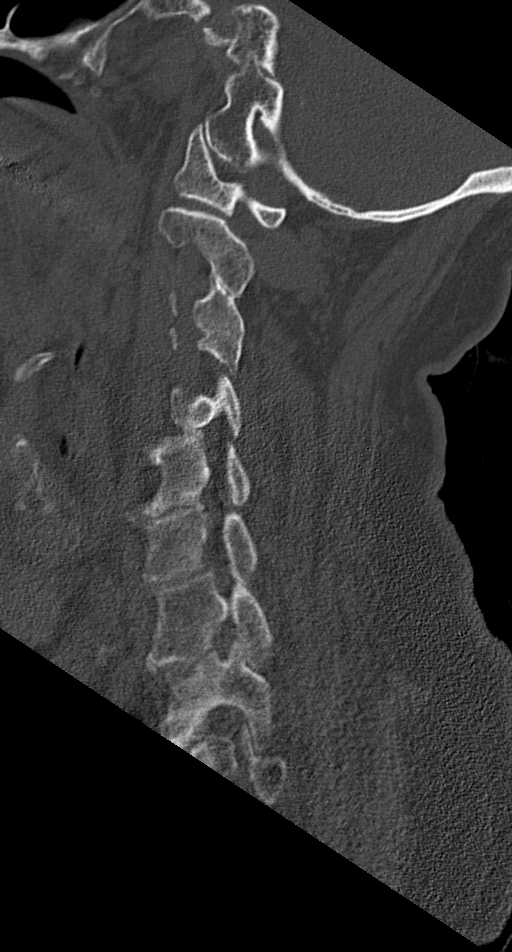
[im 41/61  bone]
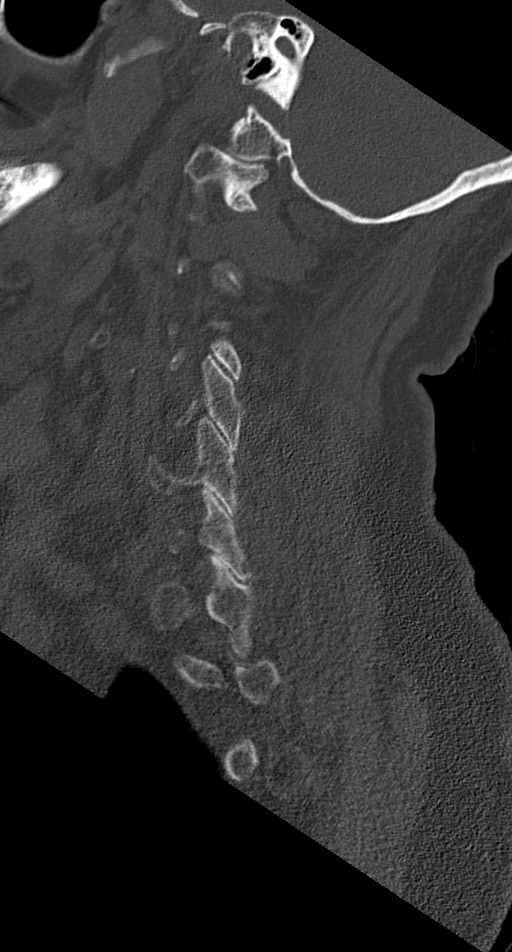

[Series 7: coronal bone · coronal · 0.29mm/px · 3 of 61 slices shown]
[im 17/61  bone]
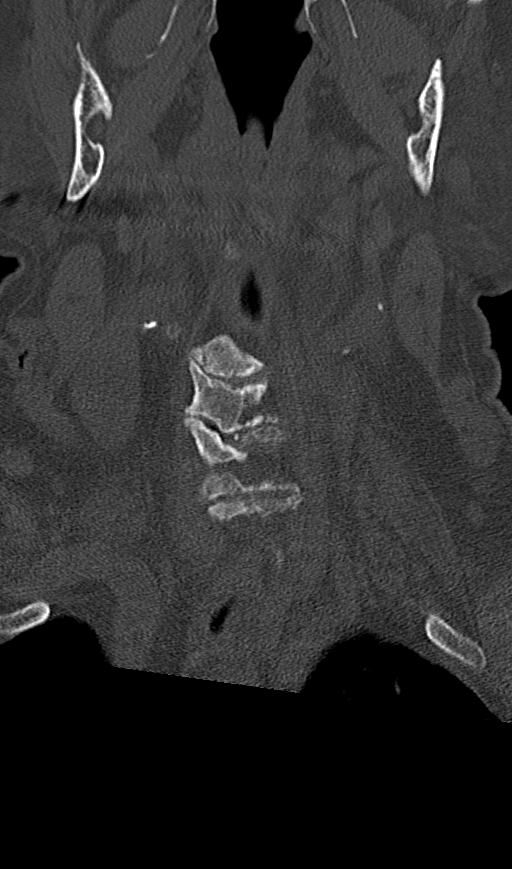
[im 26/61  bone]
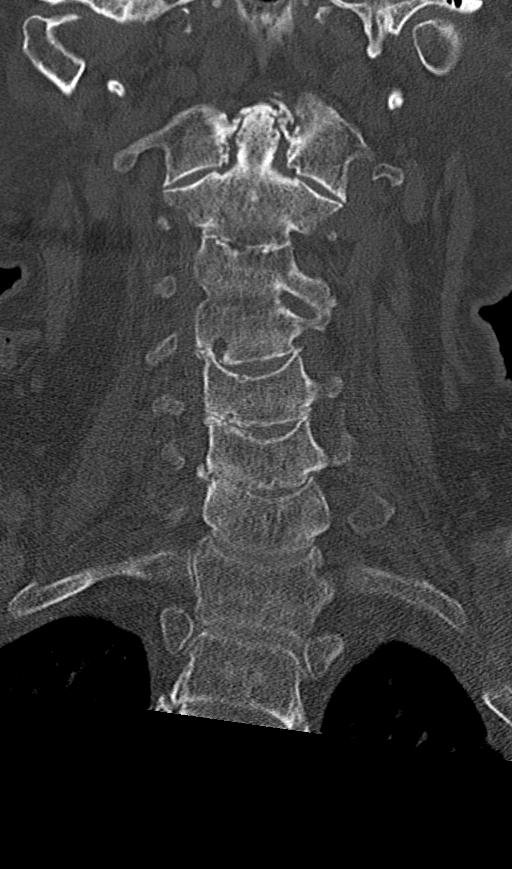
[im 35/61  bone]
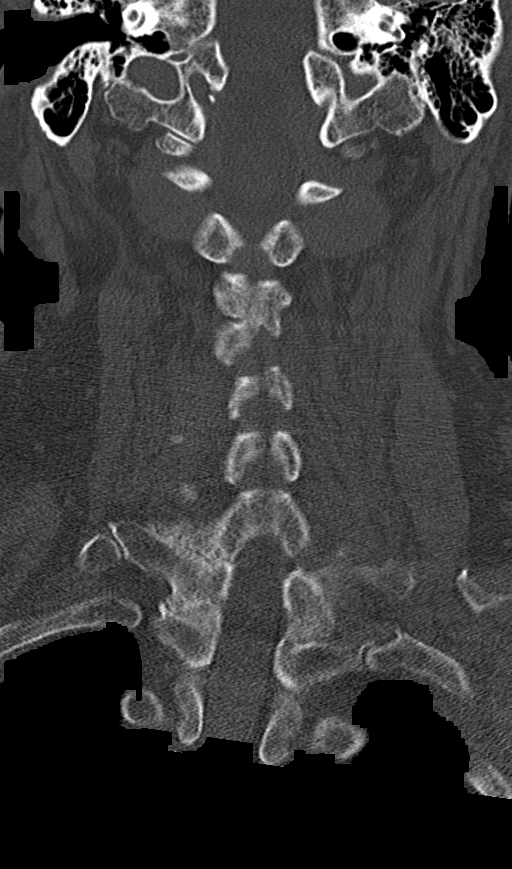

[Series 8: orthogonal bone · axial · 0.23mm/px · z∈[-324,-237]mm · 2 of 123 slices shown, 3 images]
[im 35/123  soft-tissue]
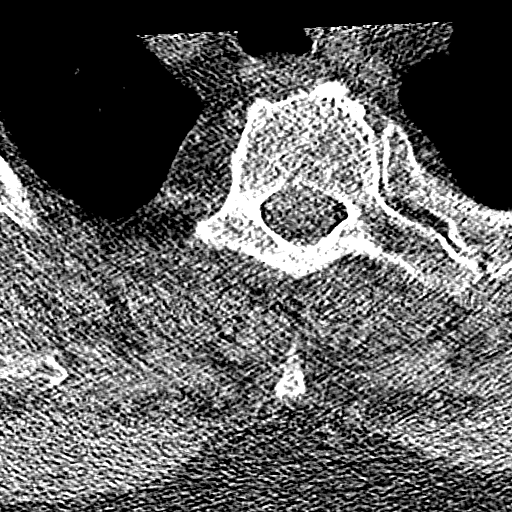
[im 35/123  bone]
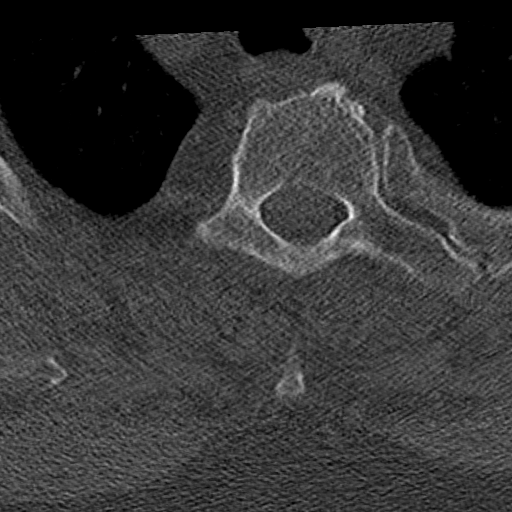
[im 88/123  bone]
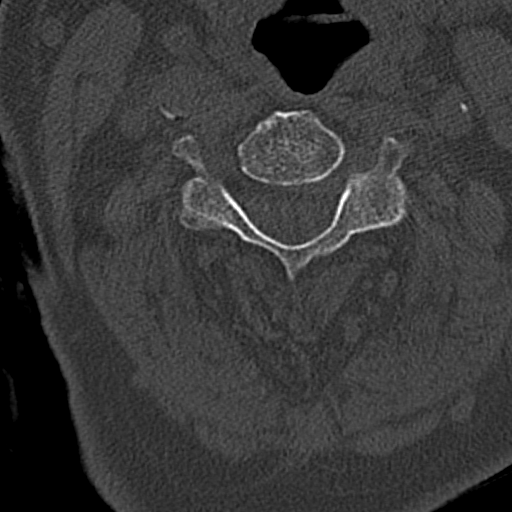

[10 of 33 positions shown; findings below may reference images not displayed]

FINDINGS: CT HEAD FINDINGS

Brain: No acute infarct, intracranial hemorrhage, intra-axial mass,
midline shift, or extra-axial fluid collection is identified.
Hypodensities in the cerebral white matter are unchanged and
nonspecific but compatible with mild chronic small vessel ischemic
disease. A chronic lacunar infarct in the right centrum semiovale is
unchanged. There is an unchanged arachnoid cyst in the right middle
cranial fossa. Mild cerebral atrophy is within normal limits for
age.

Vascular: Calcified atherosclerosis at the skull base. No hyperdense
vessel.

Skull: No fracture or suspicious osseous lesion.

Sinuses/Orbits: Small mucous retention cyst in the left maxillary
sinus. Clear mastoid air cells. Bilateral cataract extraction.

Other: None.

CT CERVICAL SPINE FINDINGS

Alignment: Chronic straightening of the normal cervical lordosis.
Unchanged trace anterolisthesis of C7 on T1.

Skull base and vertebrae: Interbody and posterior element ankylosis
at C2-3 and C3-4. No acute fracture or suspicious osseous lesion.

Soft tissues and spinal canal: No prevertebral fluid or swelling. No
visible canal hematoma.

Disc levels: Bulky anterior vertebral spurring from C4-C7 with
associated disc space narrowing, moderate at C6-7. Asymmetrically
severe right facet arthrosis at C7-T1. Moderate multilevel neural
foraminal stenosis and suspected mild-to-moderate multilevel spinal
stenosis.

Upper chest: Clear lung apices.

Other: Partially visualized right jugular catheter. Carotid
atherosclerosis.
IMPRESSION: 1. No evidence of acute intracranial abnormality.
2. Mild chronic small vessel ischemic disease.
3. Unchanged right middle cranial fossa arachnoid cyst.
4. No acute cervical spine fracture or traumatic malalignment.

## 2023-07-13 IMAGING — MR MR HEAD W/O CM
12 series · 47 of 48 positions shown · non-contrast
Comparison: None.

CLINICAL DATA: Acute neurologic deficit

EXAM:
MRI HEAD WITHOUT CONTRAST
TECHNIQUE: Multiplanar, multiecho pulse sequences of the brain and surrounding
structures were obtained without intravenous contrast.

[Series 5: ax dwi_tracew · axial · 3.0mm · 0.65mm/px · z∈[-25,+124]mm · 3 of 48 slices shown]
[im 1/48]
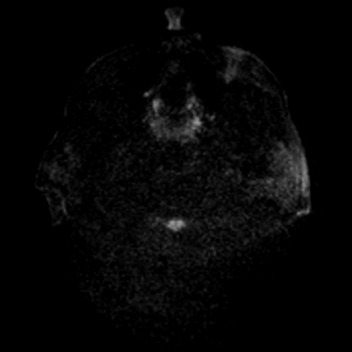
[im 24/48]
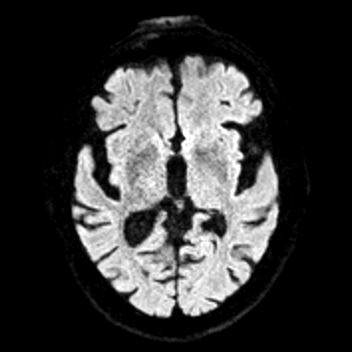
[im 48/48]
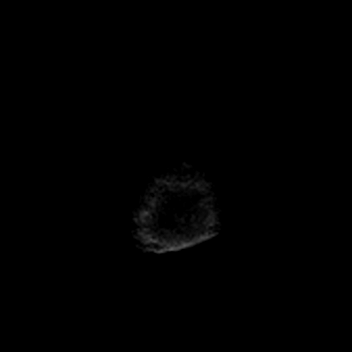

[Series 6: ax dwi_adc · axial · 3.0mm · 0.65mm/px · z∈[-25,+124]mm · 4 of 48 slices shown]
[im 1/48]
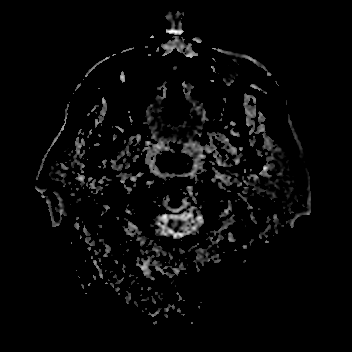
[im 16/48]
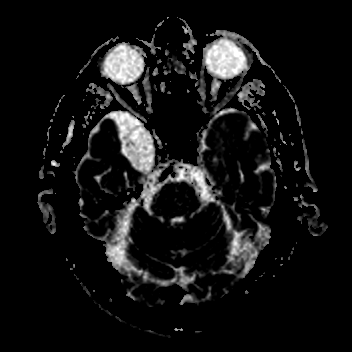
[im 32/48]
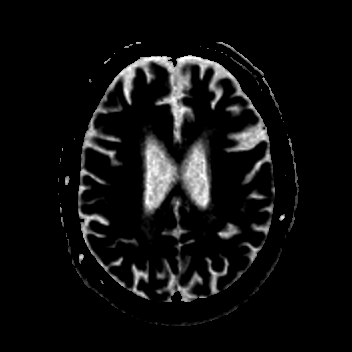
[im 48/48]
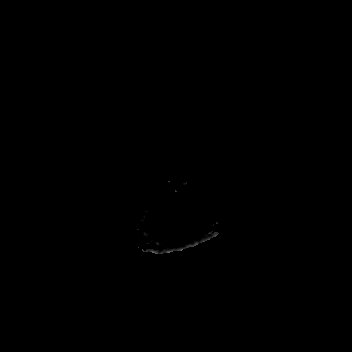

[Series 7: cor dwi_tracew · coronal · 5.0mm · 0.60mm/px · 3 of 36 slices shown]
[im 1/36]
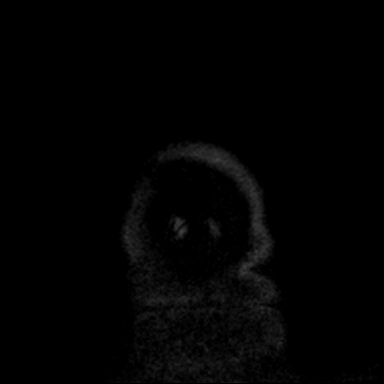
[im 18/36]
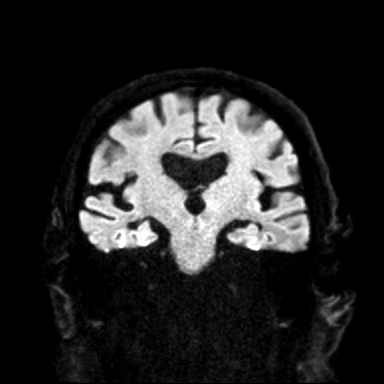
[im 36/36]
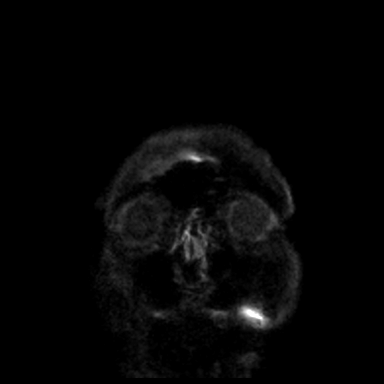

[Series 8: cor dwi_adc · coronal · 5.0mm · 0.60mm/px · 3 of 36 slices shown]
[im 1/36]
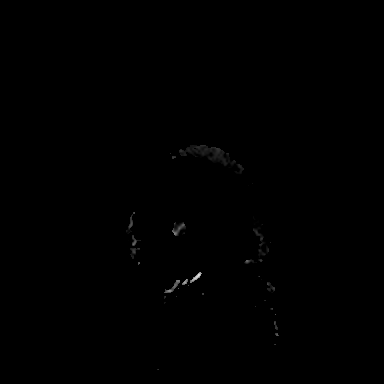
[im 18/36]
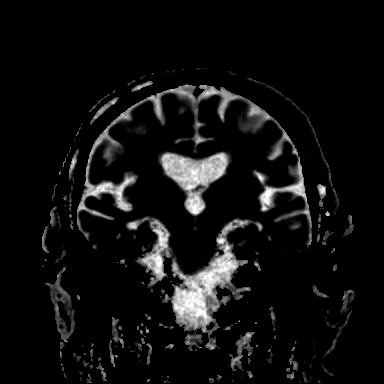
[im 36/36]
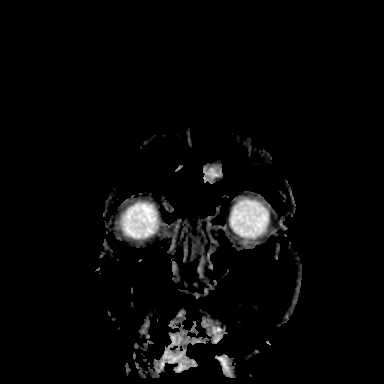

[Series 9: T1 · sagittal · 5.0mm · 0.62mm/px · 2 of 22 slices shown (1 of 2)]
[im 1/22]
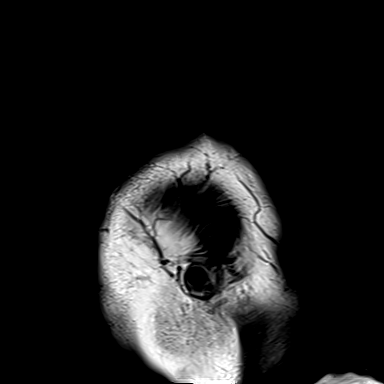
[im 22/22]
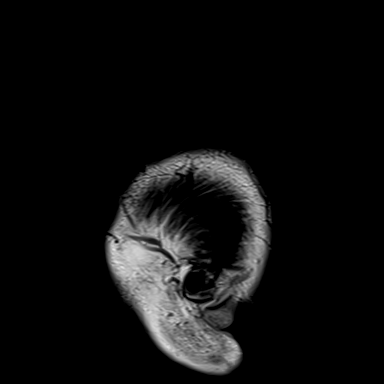

[Series 10: T2 · axial · 5.0mm · 0.53mm/px · z∈[-18,+120]mm · 2 of 25 slices shown (1 of 2)]
[im 1/25]
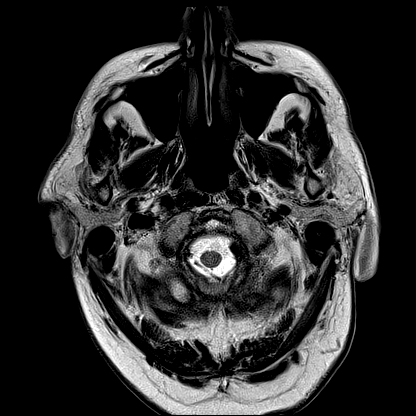
[im 25/25]
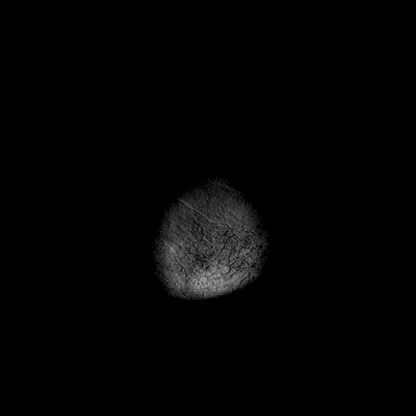

[Series 11: mag_images · axial · 3.0mm · 0.90mm/px · z∈[-33,+137]mm · 4 of 60 slices shown]
[im 1/60]
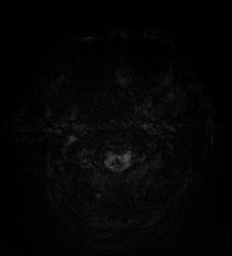
[im 20/60]
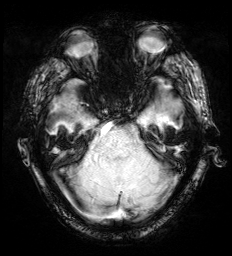
[im 40/60]
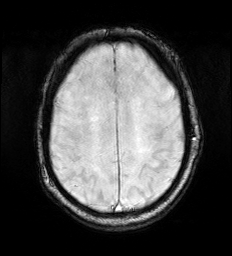
[im 60/60]
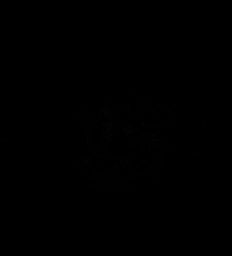

[Series 12: pha_images · axial · 3.0mm · 0.90mm/px · z∈[-33,+137]mm · 4 of 60 slices shown]
[im 1/60]
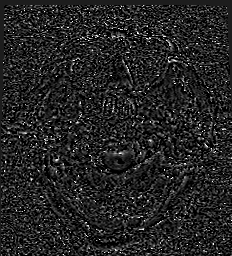
[im 20/60]
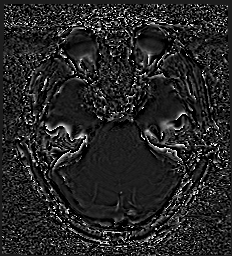
[im 40/60]
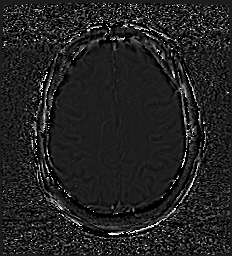
[im 60/60]
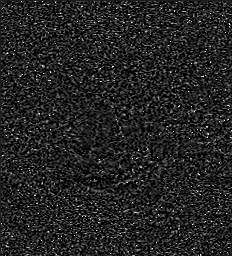

[Series 13: swi_images · axial · 3.0mm · 0.90mm/px · z∈[-33,+137]mm · 4 of 60 slices shown]
[im 1/60]
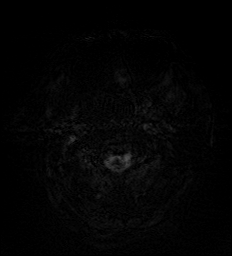
[im 20/60]
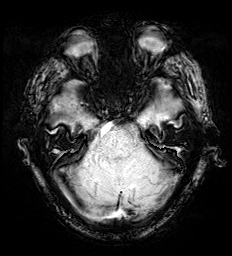
[im 40/60]
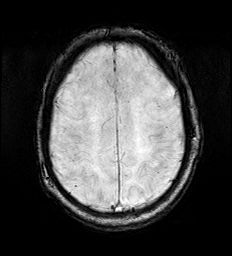
[im 60/60]
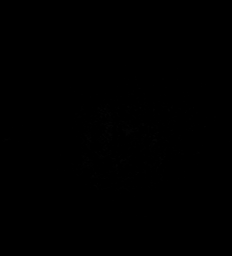

[Series 15: FLAIR · axial · 3.0mm · 0.53mm/px · z∈[-27,+129]mm · 4 of 55 slices shown]
[im 1/55]
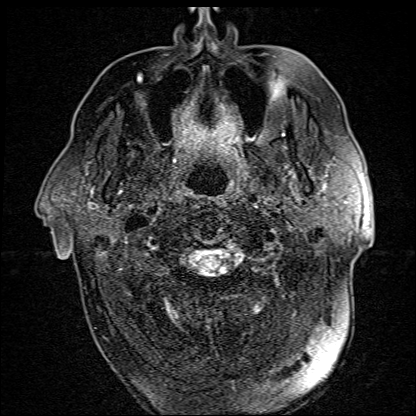
[im 19/55]
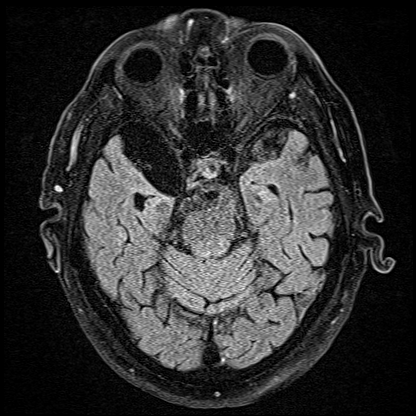
[im 37/55]
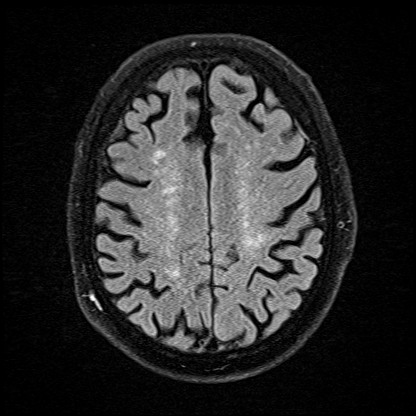
[im 55/55]
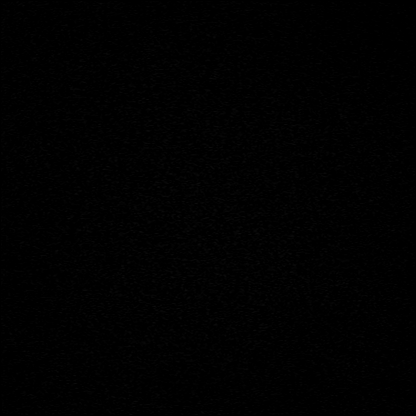

[Series 16: T1 · axial · 1.0mm · 0.98mm/px · z∈[-29,+139]mm · 12 of 175 slices shown (2 of 2)]
[im 1/175]
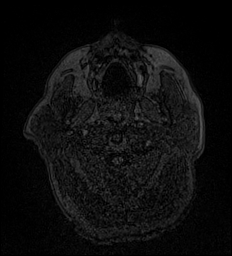
[im 15/175]
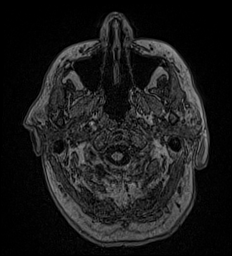
[im 30/175]
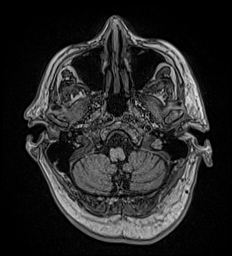
[im 44/175]
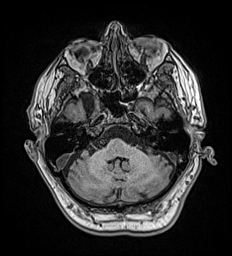
[im 59/175]
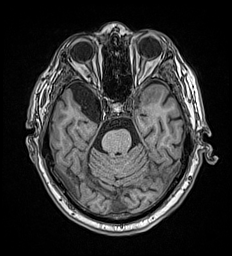
[im 73/175]
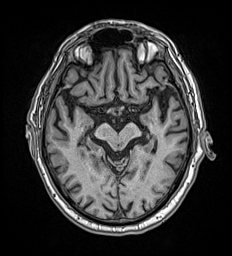
[im 88/175]
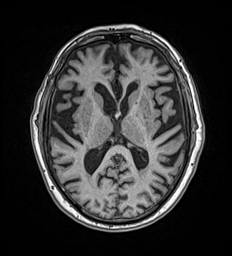
[im 102/175]
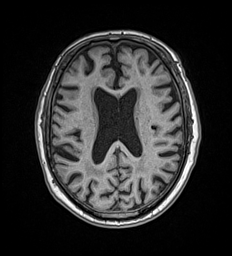
[im 117/175]
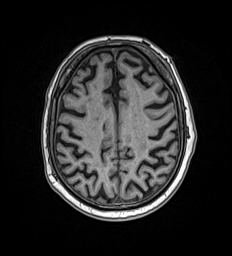
[im 131/175]
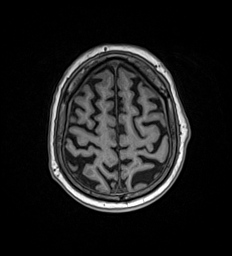
[im 146/175]
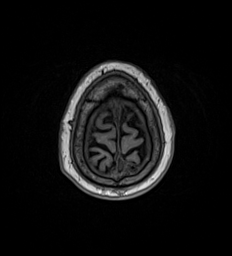
[im 175/175]
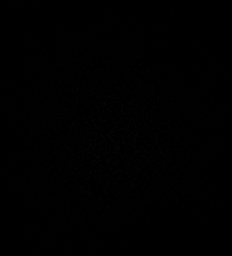

[Series 17: T2 · coronal · 5.0mm · 0.57mm/px · 2 of 29 slices shown (2 of 2)]
[im 1/29]
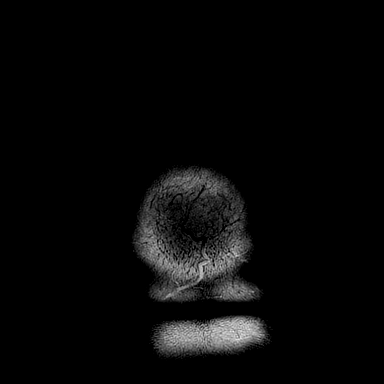
[im 29/29]
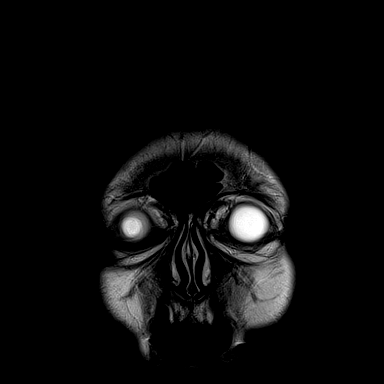

[47 of 48 positions shown; findings below may reference images not displayed]

FINDINGS: Brain: No acute infarct, mass effect or extra-axial collection. No
acute or chronic hemorrhage. There is multifocal hyperintense
T2-weighted signal within the white matter. Generalized volume loss
without a clear lobar predilection. Right middle cranial fossa
arachnoid cyst. Old left cerebellar infarct. The midline structures
are normal.

Vascular: Major flow voids are preserved.

Skull and upper cervical spine: Normal calvarium and skull base.
Visualized upper cervical spine and soft tissues are normal.

Sinuses/Orbits:No paranasal sinus fluid levels or advanced mucosal
thickening. No mastoid or middle ear effusion. Normal orbits.
IMPRESSION: 1. No acute intracranial abnormality.
2. Findings of chronic ischemic microangiopathy and generalized
volume loss.

## 2023-07-15 DIAGNOSIS — N186 End stage renal disease: Secondary | ICD-10-CM | POA: Diagnosis not present

## 2023-07-15 DIAGNOSIS — D509 Iron deficiency anemia, unspecified: Secondary | ICD-10-CM | POA: Diagnosis not present

## 2023-07-15 DIAGNOSIS — N250 Renal osteodystrophy: Secondary | ICD-10-CM | POA: Diagnosis not present

## 2023-07-15 DIAGNOSIS — D631 Anemia in chronic kidney disease: Secondary | ICD-10-CM | POA: Diagnosis not present

## 2023-07-15 DIAGNOSIS — Z992 Dependence on renal dialysis: Secondary | ICD-10-CM | POA: Diagnosis not present

## 2023-07-18 DIAGNOSIS — N250 Renal osteodystrophy: Secondary | ICD-10-CM | POA: Diagnosis not present

## 2023-07-18 DIAGNOSIS — D631 Anemia in chronic kidney disease: Secondary | ICD-10-CM | POA: Diagnosis not present

## 2023-07-18 DIAGNOSIS — N186 End stage renal disease: Secondary | ICD-10-CM | POA: Diagnosis not present

## 2023-07-18 DIAGNOSIS — Z992 Dependence on renal dialysis: Secondary | ICD-10-CM | POA: Diagnosis not present

## 2023-07-18 DIAGNOSIS — D509 Iron deficiency anemia, unspecified: Secondary | ICD-10-CM | POA: Diagnosis not present

## 2023-07-19 DIAGNOSIS — D631 Anemia in chronic kidney disease: Secondary | ICD-10-CM | POA: Diagnosis not present

## 2023-07-19 DIAGNOSIS — N186 End stage renal disease: Secondary | ICD-10-CM | POA: Diagnosis not present

## 2023-07-19 DIAGNOSIS — S72141G Displaced intertrochanteric fracture of right femur, subsequent encounter for closed fracture with delayed healing: Secondary | ICD-10-CM | POA: Diagnosis not present

## 2023-07-19 DIAGNOSIS — I5022 Chronic systolic (congestive) heart failure: Secondary | ICD-10-CM | POA: Diagnosis not present

## 2023-07-19 DIAGNOSIS — E1122 Type 2 diabetes mellitus with diabetic chronic kidney disease: Secondary | ICD-10-CM | POA: Diagnosis not present

## 2023-07-19 DIAGNOSIS — I132 Hypertensive heart and chronic kidney disease with heart failure and with stage 5 chronic kidney disease, or end stage renal disease: Secondary | ICD-10-CM | POA: Diagnosis not present

## 2023-07-20 DIAGNOSIS — Z992 Dependence on renal dialysis: Secondary | ICD-10-CM | POA: Diagnosis not present

## 2023-07-20 DIAGNOSIS — N186 End stage renal disease: Secondary | ICD-10-CM | POA: Diagnosis not present

## 2023-07-22 DIAGNOSIS — N250 Renal osteodystrophy: Secondary | ICD-10-CM | POA: Diagnosis not present

## 2023-07-22 DIAGNOSIS — D631 Anemia in chronic kidney disease: Secondary | ICD-10-CM | POA: Diagnosis not present

## 2023-07-22 DIAGNOSIS — Z992 Dependence on renal dialysis: Secondary | ICD-10-CM | POA: Diagnosis not present

## 2023-07-22 DIAGNOSIS — D509 Iron deficiency anemia, unspecified: Secondary | ICD-10-CM | POA: Diagnosis not present

## 2023-07-22 DIAGNOSIS — N186 End stage renal disease: Secondary | ICD-10-CM | POA: Diagnosis not present

## 2023-07-25 ENCOUNTER — Ambulatory Visit: Payer: Medicare Other | Admitting: Internal Medicine

## 2023-07-25 DIAGNOSIS — N250 Renal osteodystrophy: Secondary | ICD-10-CM | POA: Diagnosis not present

## 2023-07-25 DIAGNOSIS — D631 Anemia in chronic kidney disease: Secondary | ICD-10-CM | POA: Diagnosis not present

## 2023-07-25 DIAGNOSIS — D509 Iron deficiency anemia, unspecified: Secondary | ICD-10-CM | POA: Diagnosis not present

## 2023-07-25 DIAGNOSIS — Z992 Dependence on renal dialysis: Secondary | ICD-10-CM | POA: Diagnosis not present

## 2023-07-25 DIAGNOSIS — N186 End stage renal disease: Secondary | ICD-10-CM | POA: Diagnosis not present

## 2023-07-26 ENCOUNTER — Ambulatory Visit: Payer: Medicare Other | Admitting: Internal Medicine

## 2023-07-26 DIAGNOSIS — D631 Anemia in chronic kidney disease: Secondary | ICD-10-CM | POA: Diagnosis not present

## 2023-07-26 DIAGNOSIS — I5022 Chronic systolic (congestive) heart failure: Secondary | ICD-10-CM | POA: Diagnosis not present

## 2023-07-26 DIAGNOSIS — S72141G Displaced intertrochanteric fracture of right femur, subsequent encounter for closed fracture with delayed healing: Secondary | ICD-10-CM | POA: Diagnosis not present

## 2023-07-26 DIAGNOSIS — I132 Hypertensive heart and chronic kidney disease with heart failure and with stage 5 chronic kidney disease, or end stage renal disease: Secondary | ICD-10-CM | POA: Diagnosis not present

## 2023-07-26 DIAGNOSIS — E1122 Type 2 diabetes mellitus with diabetic chronic kidney disease: Secondary | ICD-10-CM | POA: Diagnosis not present

## 2023-07-26 DIAGNOSIS — N186 End stage renal disease: Secondary | ICD-10-CM | POA: Diagnosis not present

## 2023-07-27 DIAGNOSIS — Z992 Dependence on renal dialysis: Secondary | ICD-10-CM | POA: Diagnosis not present

## 2023-07-27 DIAGNOSIS — N250 Renal osteodystrophy: Secondary | ICD-10-CM | POA: Diagnosis not present

## 2023-07-27 DIAGNOSIS — D631 Anemia in chronic kidney disease: Secondary | ICD-10-CM | POA: Diagnosis not present

## 2023-07-27 DIAGNOSIS — D509 Iron deficiency anemia, unspecified: Secondary | ICD-10-CM | POA: Diagnosis not present

## 2023-07-27 DIAGNOSIS — N186 End stage renal disease: Secondary | ICD-10-CM | POA: Diagnosis not present

## 2023-07-29 DIAGNOSIS — Z87891 Personal history of nicotine dependence: Secondary | ICD-10-CM | POA: Diagnosis not present

## 2023-07-29 DIAGNOSIS — Z7984 Long term (current) use of oral hypoglycemic drugs: Secondary | ICD-10-CM | POA: Diagnosis not present

## 2023-07-29 DIAGNOSIS — G4700 Insomnia, unspecified: Secondary | ICD-10-CM | POA: Diagnosis not present

## 2023-07-29 DIAGNOSIS — N250 Renal osteodystrophy: Secondary | ICD-10-CM | POA: Diagnosis not present

## 2023-07-29 DIAGNOSIS — D509 Iron deficiency anemia, unspecified: Secondary | ICD-10-CM | POA: Diagnosis not present

## 2023-07-29 DIAGNOSIS — I429 Cardiomyopathy, unspecified: Secondary | ICD-10-CM | POA: Diagnosis not present

## 2023-07-29 DIAGNOSIS — I2510 Atherosclerotic heart disease of native coronary artery without angina pectoris: Secondary | ICD-10-CM | POA: Diagnosis not present

## 2023-07-29 DIAGNOSIS — K219 Gastro-esophageal reflux disease without esophagitis: Secondary | ICD-10-CM | POA: Diagnosis not present

## 2023-07-29 DIAGNOSIS — S72141G Displaced intertrochanteric fracture of right femur, subsequent encounter for closed fracture with delayed healing: Secondary | ICD-10-CM | POA: Diagnosis not present

## 2023-07-29 DIAGNOSIS — N186 End stage renal disease: Secondary | ICD-10-CM | POA: Diagnosis not present

## 2023-07-29 DIAGNOSIS — E1122 Type 2 diabetes mellitus with diabetic chronic kidney disease: Secondary | ICD-10-CM | POA: Diagnosis not present

## 2023-07-29 DIAGNOSIS — Z951 Presence of aortocoronary bypass graft: Secondary | ICD-10-CM | POA: Diagnosis not present

## 2023-07-29 DIAGNOSIS — I951 Orthostatic hypotension: Secondary | ICD-10-CM | POA: Diagnosis not present

## 2023-07-29 DIAGNOSIS — E039 Hypothyroidism, unspecified: Secondary | ICD-10-CM | POA: Diagnosis not present

## 2023-07-29 DIAGNOSIS — Z992 Dependence on renal dialysis: Secondary | ICD-10-CM | POA: Diagnosis not present

## 2023-07-29 DIAGNOSIS — I5022 Chronic systolic (congestive) heart failure: Secondary | ICD-10-CM | POA: Diagnosis not present

## 2023-07-29 DIAGNOSIS — I132 Hypertensive heart and chronic kidney disease with heart failure and with stage 5 chronic kidney disease, or end stage renal disease: Secondary | ICD-10-CM | POA: Diagnosis not present

## 2023-07-29 DIAGNOSIS — E785 Hyperlipidemia, unspecified: Secondary | ICD-10-CM | POA: Diagnosis not present

## 2023-07-29 DIAGNOSIS — Z9181 History of falling: Secondary | ICD-10-CM | POA: Diagnosis not present

## 2023-07-29 DIAGNOSIS — I7121 Aneurysm of the ascending aorta, without rupture: Secondary | ICD-10-CM | POA: Diagnosis not present

## 2023-07-29 DIAGNOSIS — I480 Paroxysmal atrial fibrillation: Secondary | ICD-10-CM | POA: Diagnosis not present

## 2023-07-29 DIAGNOSIS — D631 Anemia in chronic kidney disease: Secondary | ICD-10-CM | POA: Diagnosis not present

## 2023-07-29 DIAGNOSIS — Z7902 Long term (current) use of antithrombotics/antiplatelets: Secondary | ICD-10-CM | POA: Diagnosis not present

## 2023-07-29 DIAGNOSIS — Z96641 Presence of right artificial hip joint: Secondary | ICD-10-CM | POA: Diagnosis not present

## 2023-08-01 DIAGNOSIS — D631 Anemia in chronic kidney disease: Secondary | ICD-10-CM | POA: Diagnosis not present

## 2023-08-01 DIAGNOSIS — Z992 Dependence on renal dialysis: Secondary | ICD-10-CM | POA: Diagnosis not present

## 2023-08-01 DIAGNOSIS — D509 Iron deficiency anemia, unspecified: Secondary | ICD-10-CM | POA: Diagnosis not present

## 2023-08-01 DIAGNOSIS — N186 End stage renal disease: Secondary | ICD-10-CM | POA: Diagnosis not present

## 2023-08-01 DIAGNOSIS — N250 Renal osteodystrophy: Secondary | ICD-10-CM | POA: Diagnosis not present

## 2023-08-02 ENCOUNTER — Ambulatory Visit (INDEPENDENT_AMBULATORY_CARE_PROVIDER_SITE_OTHER): Payer: Medicare Other | Admitting: Internal Medicine

## 2023-08-02 ENCOUNTER — Encounter: Payer: Self-pay | Admitting: Internal Medicine

## 2023-08-02 VITALS — BP 104/64 | HR 90 | Ht 72.0 in | Wt 194.0 lb

## 2023-08-02 DIAGNOSIS — I5022 Chronic systolic (congestive) heart failure: Secondary | ICD-10-CM

## 2023-08-02 DIAGNOSIS — N186 End stage renal disease: Secondary | ICD-10-CM

## 2023-08-02 DIAGNOSIS — E782 Mixed hyperlipidemia: Secondary | ICD-10-CM

## 2023-08-02 DIAGNOSIS — Z992 Dependence on renal dialysis: Secondary | ICD-10-CM | POA: Diagnosis not present

## 2023-08-02 DIAGNOSIS — I120 Hypertensive chronic kidney disease with stage 5 chronic kidney disease or end stage renal disease: Secondary | ICD-10-CM

## 2023-08-02 DIAGNOSIS — S72141G Displaced intertrochanteric fracture of right femur, subsequent encounter for closed fracture with delayed healing: Secondary | ICD-10-CM | POA: Diagnosis not present

## 2023-08-02 DIAGNOSIS — E1122 Type 2 diabetes mellitus with diabetic chronic kidney disease: Secondary | ICD-10-CM

## 2023-08-02 DIAGNOSIS — I132 Hypertensive heart and chronic kidney disease with heart failure and with stage 5 chronic kidney disease, or end stage renal disease: Secondary | ICD-10-CM | POA: Diagnosis not present

## 2023-08-02 DIAGNOSIS — D631 Anemia in chronic kidney disease: Secondary | ICD-10-CM | POA: Diagnosis not present

## 2023-08-02 LAB — POCT CBG (FASTING - GLUCOSE)-MANUAL ENTRY: Glucose Fasting, POC: 124 mg/dL — AB (ref 70–99)

## 2023-08-02 NOTE — Progress Notes (Signed)
 Established Patient Office Visit  Subjective:  Patient ID: Matthew [name], male    DOB: 1930/07/11  Age: 89 y.o. MRN: [id number]  Chief Complaint  Patient presents with   Follow-up    3 month follow up     No new complaints, here for lab review and medication refills. Home bg have been well controlled at home. Last A1c was 6.2 a month ago.     No other concerns at this time.   Past Medical History:  Diagnosis Date   Anginal pain (HCC)    Arthritis    Ascending aortic aneurysm (HCC) 07/18/2015   a.) TTE 07/18/2015: severe dilation of ascending aorta measuring 7.0 cm. b.) TTE 01/10/2018: aortic root 3.8 cm; ascending aorta 6.8 cm; refused surgical intervention/repair.   Atrial fibrillation (HCC)    a.) CHA2DS2-VASc = 6 (age x 2, HFrEF, HTN, previous MI, T2DM). b.) rate/rhythm maintained on amiodarone + metoprolol succinate; chronic antiplatelet therapy using clopidogrel   Bradycardia    Coronary artery disease    ESRD (end stage renal disease) (HCC)    GERD (gastroesophageal reflux disease)    HFrEF (heart failure with reduced ejection fraction) (HCC)    a.) TTE 12/03/2013: mod dec LV function; EF 35-40%; severe inferolateral and inferior HK; LA dilated; G3DD; PASP 52 mmHg. b.) TTE 07/18/2015: mild LV dysfunction with mild LVH; EF 45%; mild BAE. c.) TTE 01/10/2018: mod LV dysfunction; EF 45%; diffuse HK   HLD (hyperlipidemia)    Hypertension    IDA (iron deficiency anemia)    Long term current use of antithrombotics/antiplatelets    a.) clopidogrel   Myocardial infarction (HCC) 2000   Pneumonia    Suzanne Kho/P CABG x 4 2001   a.) LIMA-LAD, SVG-D1, SVG-OM1, SVG-OM2, SVG-PDA   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   left frontal scalp, EDC   Squamous cell carcinoma of skin 05/05/2020   left temporal scalp, in situ, EDC 05/13/20   T2DM (type 2 diabetes mellitus) (HCC)    Valvular insufficiency 12/03/2013   a.) TTE 12/03/2013: EF 35-40%; mild AR/MR, b.) TTE 07/18/2015: EF 45%;  mild AR/PR, mod TR, severe MR. c.) TTE 01/10/2018: mod AR/TR    Past Surgical History:  Procedure Laterality Date   A/V FISTULAGRAM Left 02/09/2023   Procedure: A/V Fistulagram;  Surgeon: Annice Needy, MD;  Location: [facility];  Service: Cardiovascular;  Laterality: Left;   A/V SHUNT INTERVENTION N/A 01/25/2023   Procedure: A/V SHUNT INTERVENTION;  Surgeon: Renford Dills, MD;  Location: [facility];  Service: Cardiovascular;  Laterality: N/A;   A/V SHUNTOGRAM Left 04/01/2022   Procedure: A/V SHUNTOGRAM;  Surgeon: Annice Needy, MD;  Location: [facility];  Service: Cardiovascular;  Laterality: Left;   AV FISTULA PLACEMENT Left 04/15/2021   Procedure: INSERTION OF ARTERIOVENOUS (AV) GORE-TEX GRAFT ARM ( BRACHIAL AXILLARY);  Surgeon: Annice Needy, MD;  Location: [facility];  Service: Vascular;  Laterality: Left;   CATARACT EXTRACTION W/PHACO Left 07/26/2017   Procedure: CATARACT EXTRACTION PHACO AND INTRAOCULAR LENS PLACEMENT (IOC);  Surgeon: Galen Manila, MD;  Location: [facility];  Service: Ophthalmology;  Laterality: Left;  [place]   00:45.8 AP%  13.1 CDE  6.00 Fluid Pack Lot # [id number]   CATARACT EXTRACTION W/PHACO Right 08/17/2017   Procedure: CATARACT EXTRACTION PHACO AND INTRAOCULAR LENS PLACEMENT (IOC);  Surgeon: Galen Manila, MD;  Location: [facility];  Service: Ophthalmology;  Laterality: Right;  [place]  01:30 AP% 16.8 CDE 15.24 Fluid pack  lot # [id number]   CHOLECYSTECTOMY     CORONARY ARTERY BYPASS GRAFT N/A 2001   Procedure: 5v CABG (LIMA-LAD, SVG-D1, SVG-OM1, SVG-OM2, SVG-PDA)   DIALYSIS/PERMA CATHETER INSERTION N/A 02/09/2018   Procedure: DIALYSIS/PERMA CATHETER INSERTION;  Surgeon: Annice Needy, MD;  Location: [facility];  Service: Cardiovascular;  Laterality: N/A;   DIALYSIS/PERMA CATHETER INSERTION N/A 02/09/2023   Procedure: DIALYSIS/PERMA CATHETER INSERTION;  Surgeon: Annice Needy, MD;  Location: [facility];  Service:  Cardiovascular;  Laterality: N/A;   DIALYSIS/PERMA CATHETER REMOVAL N/A 06/25/2021   Procedure: DIALYSIS/PERMA CATHETER REMOVAL;  Surgeon: Annice Needy, MD;  Location: [facility];  Service: Cardiovascular;  Laterality: N/A;   FRACTURE SURGERY Left 2015   LEFT arm   INSERTION OF DIALYSIS CATHETER Right 11/03/2020   Procedure: INSERTION OF Perm Cath in the RIGHT INTERNAL JUGULAR;  Surgeon: Annice Needy, MD;  Location: [facility];  Service: Vascular;  Laterality: Right;   INTRAMEDULLARY (IM) NAIL INTERTROCHANTERIC Right 11/12/2022   Procedure: INTRAMEDULLARY (IM) NAIL INTERTROCHANTERIC;  Surgeon: Juanell Fairly, MD;  Location: [facility];  Service: Orthopedics;  Laterality: Right;   LEFT HEART CATHETERIZATION WITH CORONARY/GRAFT ANGIOGRAM Left 12/04/2013   Procedure: LEFT HEART CATHETERIZATION WITH Isabel Caprice;  Surgeon: Marykay Lex, MD;  Location: [facility];  Service: Cardiovascular;  Laterality: Left;   LEFT HEART CATHETERIZATION WITH CORONARY/GRAFT ANGIOGRAM Left 02/06/2009   Procedure: LEFT HEART CATHETERIZATION WITH CORONARY/GRAFT ANGIOGRAM; Location: [facility]; Surgeon: Despina Hick, MD   PERCUTANEOUS CORONARY STENT INTERVENTION (PCI-Farran Amsden) N/A 12/06/2013   Procedure: STAGED PERCUTANEOUS CORONARY STENT INTERVENTION (overlapping 4.0 x 38 mm (mid) and 4.0 x 28 mm (ostial) Promus Primier DES to SVG-RPDA graft);  Surgeon: Lesleigh Noe, MD;  Location: [facility];  Service: Cardiovascular   REMOVAL OF A DIALYSIS CATHETER N/A 11/03/2020   Procedure: REMOVAL OF A DIALYSIS CATHETER;  Surgeon: Annice Needy, MD;  Location: [facility];  Service: Vascular;  Laterality: N/A;   TEMPORARY DIALYSIS CATHETER N/A 02/04/2023   Procedure: TEMPORARY DIALYSIS CATHETER;  Surgeon: Renford Dills, MD;  Location: [facility];  Service: Cardiovascular;  Laterality: N/A;   TOTAL HIP REVISION Right 01/12/2023   Procedure: CONVERSION TO  RIGHT TOTAL HIP REPLACEMENT;  Surgeon: Joen Laura, MD;  Location: [facility];  Service: Orthopedics;  Laterality: Right;    Social History   Socioeconomic History   Marital status: Widowed    Spouse name: Not on file   Number of children: Not on file   Years of education: Not on file   Highest education level: Not on file  Occupational History   Not on file  Tobacco Use   Smoking status: Former    Current packs/day: 0.00    Types: Pipe, Cigarettes    Quit date: 68    Years since quitting: 33.2   Smokeless tobacco: Never  Vaping Use   Vaping status: Never Used  Substance and Sexual Activity   Alcohol use: Not Currently    Alcohol/week: 1.0 standard drink of alcohol    Types: 1 Cans of beer per week    Comment: occasionally drinking only,once a month   Drug use: No   Sexual activity: Never  Other Topics Concern   Not on file  Social History Narrative   Lives with daughter and son in law   Social Drivers of Health   Financial Resource Strain: Low Risk  (01/10/2018)   Overall Financial Resource Strain (CARDIA)  Difficulty of Paying Living Expenses: Not hard at all  Food Insecurity: No Food Insecurity (02/04/2023)   Hunger Vital Sign    Worried About Running Out of Food in the Last Year: Never true    Ran Out of Food in the Last Year: Never true  Transportation Needs: No Transportation Needs (02/04/2023)   PRAPARE - Administrator, Civil Service (Medical): No    Lack of Transportation (Non-Medical): No  Physical Activity: Inactive (01/10/2018)   Exercise Vital Sign    Days of Exercise per Week: 0 days    Minutes of Exercise per Session: 0 min  Stress: No Stress Concern Present (01/10/2018)   [employer] of Occupational Health - Occupational Stress Questionnaire    Feeling of Stress : Not at all  Social Connections: Socially Integrated (01/10/2018)   Social Connection and Isolation Panel [NHANES]    Frequency of Communication with Friends and Family: More than three times a week    Frequency of Social  Gatherings with Friends and Family: More than three times a week    Attends Religious Services: More than 4 times per year    Active Member of [employer] or Organizations: Yes    Attends Engineer, structural: More than 4 times per year    Marital Status: Married  Catering manager Violence: Not At Risk (02/04/2023)   Humiliation, Afraid, Rape, and Kick questionnaire    Fear of Current or Ex-Partner: No    Emotionally Abused: No    Physically Abused: No    Sexually Abused: No    Family History  Problem Relation Age of Onset   Heart attack Father 25       died first MI at age 5   Heart failure Mother     Allergies  Allergen Reactions   Amoxicillin Diarrhea    Outpatient Medications Prior to Visit  Medication Sig   aspirin EC 81 MG tablet Take 81 mg by mouth daily. Swallow whole.   cetirizine (ZYRTEC) 10 MG tablet Take 10 mg by mouth daily.   cholecalciferol (VITAMIN D) 25 MCG (1000 UNIT) tablet Take 1,000 Units by mouth daily with breakfast.   clopidogrel (PLAVIX) 75 MG tablet TAKE 1 TABLET BY MOUTH DAILY   Continuous Glucose Receiver (FREESTYLE LIBRE 3 READER) DEVI 1 Device by Does not apply route daily.   Continuous Glucose Sensor (FREESTYLE LIBRE 3 SENSOR) MISC 1 patch by Does not apply route as directed. Place 1 sensor on the skin every 14 days. Use to check glucose continuously   ferrous sulfate 325 (65 FE) MG tablet Take 325 mg by mouth 2 (two) times daily with a meal.    fluticasone (FLONASE) 50 MCG/ACT nasal spray Place 2 sprays into both nostrils daily as needed for allergies or rhinitis.   glipiZIDE 2.5 MG TABS Take 1 tablet by mouth AC breakfast.   levothyroxine (SYNTHROID) 100 MCG tablet Take 100 mcg by mouth daily before breakfast.   lidocaine-prilocaine (EMLA) cream Apply 1 Application topically as needed (before dialysis).   midodrine (PROAMATINE) 5 MG tablet Take 1 tablet (5 mg total) by mouth 3 (three) times daily with meals. (Patient taking differently: Take 5  mg by mouth 3 (three) times daily with meals. 5MG  1 HOUR BEFORE DIALYSIS)   multivitamin (RENA-VIT) TABS tablet Take 1 tablet by mouth daily.   nitroGLYCERIN (NITROSTAT) 0.4 MG SL tablet DISSOLVE 1 TABLET UNDER THE  TONGUE EVERY 5 MINUTES AS NEEDED FOR CHEST PAIN. MAX OF 3 TABLETS IN  15 MINUTES. CALL 911 IF PAIN  PERSISTS.   Omega-3 Fatty Acids (FISH OIL) 1000 MG CAPS Take 4,000 mg by mouth daily with lunch.   polyethylene glycol (MIRALAX / GLYCOLAX) 17 g packet Take 17 g by mouth daily.   rosuvastatin (CRESTOR) 40 MG tablet TAKE 1 TABLET BY MOUTH ONCE  DAILY   senna-docusate (SENOKOT-Whitten Andreoni) 8.6-50 MG tablet Take 2 tablets by mouth 2 (two) times daily.   No facility-administered medications prior to visit.    Review of Systems  Constitutional: Negative.   HENT: Negative.    Eyes: Negative.   Respiratory: Negative.    Cardiovascular: Negative.   Gastrointestinal: Negative.   Genitourinary: Negative.   Skin: Negative.   Neurological: Negative.   Endo/Heme/Allergies: Negative.        Objective:   BP 104/64   Pulse 90   Ht 6' (1.829 m)   Wt 194 lb (88 kg)   SpO2 97%   BMI 26.31 kg/m   Vitals:   08/02/23 1306  BP: 104/64  Pulse: 90  Height: 6' (1.829 m)  Weight: 194 lb (88 kg)  SpO2: 97%  BMI (Calculated): 26.31    Physical Exam Vitals reviewed.  Constitutional:      Appearance: Normal appearance. He is obese.  HENT:     Head: Normocephalic.     Left Ear: There is no impacted cerumen.     Nose: Nose normal.     Mouth/Throat:     Mouth: Mucous membranes are moist.     Pharynx: No posterior oropharyngeal erythema.  Eyes:     Extraocular Movements: Extraocular movements intact.     Pupils: Pupils are equal, round, and reactive to light.  Cardiovascular:     Rate and Rhythm: Regular rhythm.     Chest Wall: PMI is not displaced.     Pulses: Normal pulses.     Heart sounds: Normal heart sounds. No murmur heard. Pulmonary:     Effort: Pulmonary effort is normal.      Breath sounds: Normal air entry. No rhonchi or rales.  Abdominal:     General: Abdomen is flat. Bowel sounds are normal. There is no distension.     Palpations: Abdomen is soft. There is no hepatomegaly, splenomegaly or mass.     Tenderness: There is no abdominal tenderness.  Musculoskeletal:        General: Normal range of motion.     Cervical back: Normal range of motion and neck supple.     Right lower leg: No edema.     Left lower leg: No edema.  Skin:    General: Skin is warm and dry.  Neurological:     General: No focal deficit present.     Mental Status: He is alert and oriented to person, place, and time.     Cranial Nerves: No cranial nerve deficit.     Motor: No weakness.  Psychiatric:        Mood and Affect: Mood normal.        Behavior: Behavior normal.      Results for orders placed or performed in visit on 08/02/23  POCT CBG (Fasting - Glucose)  Result Value Ref Range   Glucose Fasting, POC 124 (A) 70 - 99 mg/dL    Recent Results (from the past 2160 hours)  Hemoglobin A1c     Status: Abnormal   Collection Time: 06/30/23 10:09 AM  Result Value Ref Range   Hgb A1c MFr Bld 6.2 (H) 4.8 - 5.6 %  Comment:          Prediabetes: 5.7 - 6.4          Diabetes: >6.4          Glycemic control for adults with diabetes: <7.0    Est. average glucose Bld gHb Est-mCnc 131 mg/dL  TSH     Status: None   Collection Time: 06/30/23 10:09 AM  Result Value Ref Range   TSH 3.470 0.450 - 4.500 uIU/mL  CMP14+EGFR     Status: Abnormal   Collection Time: 06/30/23 10:09 AM  Result Value Ref Range   Glucose 106 (H) 70 - 99 mg/dL   BUN 50 (H) 10 - 36 mg/dL   Creatinine, Ser 0.98 (H) 0.76 - 1.27 mg/dL   eGFR 19 (L) >11 BJ/YNW/2.95   BUN/Creatinine Ratio 16 10 - 24   Sodium 140 134 - 144 mmol/L   Potassium 4.8 3.5 - 5.2 mmol/L   Chloride 96 96 - 106 mmol/L   CO2 25 20 - 29 mmol/L   Calcium 9.7 8.6 - 10.2 mg/dL   Total Protein 7.2 6.0 - 8.5 g/dL   Albumin 4.1 3.6 - 4.6 g/dL    Globulin, Total 3.1 1.5 - 4.5 g/dL   Bilirubin Total 0.7 0.0 - 1.2 mg/dL   Alkaline Phosphatase 246 (H) 44 - 121 IU/L   AST 41 (H) 0 - 40 IU/L   ALT 45 (H) 0 - 44 IU/L  Lipid panel     Status: None   Collection Time: 06/30/23 10:09 AM  Result Value Ref Range   Cholesterol, Total 131 100 - 199 mg/dL   Triglycerides 84 0 - 149 mg/dL   HDL 64 >62 mg/dL   VLDL Cholesterol Cal 16 5 - 40 mg/dL   LDL Chol Calc (NIH) 51 0 - 99 mg/dL   Chol/HDL Ratio 2.0 0.0 - 5.0 ratio    Comment:                                   T. Chol/HDL Ratio                                             Men  Women                               1/2 Avg.Risk  3.4    3.3                                   Avg.Risk  5.0    4.4                                2X Avg.Risk  9.6    7.1                                3X Avg.Risk 23.4   11.0   POCT CBG (Fasting - Glucose)     Status: Abnormal   Collection Time: 08/02/23  1:09 PM  Result Value Ref Range   Glucose Fasting, POC 124 (A) 70 - 99  mg/dL      Assessment & Plan:  As per problem list  Problem List Items Addressed This Visit       Cardiovascular and Mediastinum   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) - Primary   Relevant Orders   POCT CBG (Fasting - Glucose) (Completed)   Comprehensive metabolic panel   Lipid panel   CK   Hemoglobin A1c     Other   HLD (hyperlipidemia)   Other Visit Diagnoses       Chronic systolic CHF (congestive heart failure) (HCC)       Relevant Orders   CBC With Diff/Platelet       Return in about 2 months (around 10/02/2023) for awv with labs prior.   Total time spent: 20 minutes  Luna Fuse, MD  08/02/2023   This document may have been prepared by [facility] Voice Recognition software and as such may include unintentional dictation errors.

## 2023-08-03 DIAGNOSIS — Z992 Dependence on renal dialysis: Secondary | ICD-10-CM | POA: Diagnosis not present

## 2023-08-03 DIAGNOSIS — D631 Anemia in chronic kidney disease: Secondary | ICD-10-CM | POA: Diagnosis not present

## 2023-08-03 DIAGNOSIS — D509 Iron deficiency anemia, unspecified: Secondary | ICD-10-CM | POA: Diagnosis not present

## 2023-08-03 DIAGNOSIS — N250 Renal osteodystrophy: Secondary | ICD-10-CM | POA: Diagnosis not present

## 2023-08-03 DIAGNOSIS — N186 End stage renal disease: Secondary | ICD-10-CM | POA: Diagnosis not present

## 2023-08-05 DIAGNOSIS — D509 Iron deficiency anemia, unspecified: Secondary | ICD-10-CM | POA: Diagnosis not present

## 2023-08-05 DIAGNOSIS — N250 Renal osteodystrophy: Secondary | ICD-10-CM | POA: Diagnosis not present

## 2023-08-05 DIAGNOSIS — Z992 Dependence on renal dialysis: Secondary | ICD-10-CM | POA: Diagnosis not present

## 2023-08-05 DIAGNOSIS — N186 End stage renal disease: Secondary | ICD-10-CM | POA: Diagnosis not present

## 2023-08-05 DIAGNOSIS — D631 Anemia in chronic kidney disease: Secondary | ICD-10-CM | POA: Diagnosis not present

## 2023-08-08 DIAGNOSIS — N186 End stage renal disease: Secondary | ICD-10-CM | POA: Diagnosis not present

## 2023-08-08 DIAGNOSIS — D509 Iron deficiency anemia, unspecified: Secondary | ICD-10-CM | POA: Diagnosis not present

## 2023-08-08 DIAGNOSIS — Z992 Dependence on renal dialysis: Secondary | ICD-10-CM | POA: Diagnosis not present

## 2023-08-08 DIAGNOSIS — D631 Anemia in chronic kidney disease: Secondary | ICD-10-CM | POA: Diagnosis not present

## 2023-08-08 DIAGNOSIS — N250 Renal osteodystrophy: Secondary | ICD-10-CM | POA: Diagnosis not present

## 2023-08-09 DIAGNOSIS — N186 End stage renal disease: Secondary | ICD-10-CM | POA: Diagnosis not present

## 2023-08-09 DIAGNOSIS — D631 Anemia in chronic kidney disease: Secondary | ICD-10-CM | POA: Diagnosis not present

## 2023-08-09 DIAGNOSIS — I5022 Chronic systolic (congestive) heart failure: Secondary | ICD-10-CM | POA: Diagnosis not present

## 2023-08-09 DIAGNOSIS — I132 Hypertensive heart and chronic kidney disease with heart failure and with stage 5 chronic kidney disease, or end stage renal disease: Secondary | ICD-10-CM | POA: Diagnosis not present

## 2023-08-09 DIAGNOSIS — S72141G Displaced intertrochanteric fracture of right femur, subsequent encounter for closed fracture with delayed healing: Secondary | ICD-10-CM | POA: Diagnosis not present

## 2023-08-09 DIAGNOSIS — E1122 Type 2 diabetes mellitus with diabetic chronic kidney disease: Secondary | ICD-10-CM | POA: Diagnosis not present

## 2023-08-10 DIAGNOSIS — Z992 Dependence on renal dialysis: Secondary | ICD-10-CM | POA: Diagnosis not present

## 2023-08-10 DIAGNOSIS — D509 Iron deficiency anemia, unspecified: Secondary | ICD-10-CM | POA: Diagnosis not present

## 2023-08-10 DIAGNOSIS — N186 End stage renal disease: Secondary | ICD-10-CM | POA: Diagnosis not present

## 2023-08-10 DIAGNOSIS — N250 Renal osteodystrophy: Secondary | ICD-10-CM | POA: Diagnosis not present

## 2023-08-10 DIAGNOSIS — D631 Anemia in chronic kidney disease: Secondary | ICD-10-CM | POA: Diagnosis not present

## 2023-08-12 DIAGNOSIS — N250 Renal osteodystrophy: Secondary | ICD-10-CM | POA: Diagnosis not present

## 2023-08-12 DIAGNOSIS — D631 Anemia in chronic kidney disease: Secondary | ICD-10-CM | POA: Diagnosis not present

## 2023-08-12 DIAGNOSIS — N186 End stage renal disease: Secondary | ICD-10-CM | POA: Diagnosis not present

## 2023-08-12 DIAGNOSIS — Z992 Dependence on renal dialysis: Secondary | ICD-10-CM | POA: Diagnosis not present

## 2023-08-12 DIAGNOSIS — D509 Iron deficiency anemia, unspecified: Secondary | ICD-10-CM | POA: Diagnosis not present

## 2023-08-16 DIAGNOSIS — E1122 Type 2 diabetes mellitus with diabetic chronic kidney disease: Secondary | ICD-10-CM | POA: Diagnosis not present

## 2023-08-16 DIAGNOSIS — I132 Hypertensive heart and chronic kidney disease with heart failure and with stage 5 chronic kidney disease, or end stage renal disease: Secondary | ICD-10-CM | POA: Diagnosis not present

## 2023-08-16 DIAGNOSIS — I5022 Chronic systolic (congestive) heart failure: Secondary | ICD-10-CM | POA: Diagnosis not present

## 2023-08-16 DIAGNOSIS — D631 Anemia in chronic kidney disease: Secondary | ICD-10-CM | POA: Diagnosis not present

## 2023-08-16 DIAGNOSIS — N186 End stage renal disease: Secondary | ICD-10-CM | POA: Diagnosis not present

## 2023-08-16 DIAGNOSIS — S72141G Displaced intertrochanteric fracture of right femur, subsequent encounter for closed fracture with delayed healing: Secondary | ICD-10-CM | POA: Diagnosis not present

## 2023-08-17 DIAGNOSIS — Z992 Dependence on renal dialysis: Secondary | ICD-10-CM | POA: Diagnosis not present

## 2023-08-17 DIAGNOSIS — N186 End stage renal disease: Secondary | ICD-10-CM | POA: Diagnosis not present

## 2023-08-17 DIAGNOSIS — N250 Renal osteodystrophy: Secondary | ICD-10-CM | POA: Diagnosis not present

## 2023-08-17 DIAGNOSIS — D509 Iron deficiency anemia, unspecified: Secondary | ICD-10-CM | POA: Diagnosis not present

## 2023-08-17 DIAGNOSIS — D631 Anemia in chronic kidney disease: Secondary | ICD-10-CM | POA: Diagnosis not present

## 2023-08-19 DIAGNOSIS — D509 Iron deficiency anemia, unspecified: Secondary | ICD-10-CM | POA: Diagnosis not present

## 2023-08-19 DIAGNOSIS — Z992 Dependence on renal dialysis: Secondary | ICD-10-CM | POA: Diagnosis not present

## 2023-08-19 DIAGNOSIS — N250 Renal osteodystrophy: Secondary | ICD-10-CM | POA: Diagnosis not present

## 2023-08-19 DIAGNOSIS — N186 End stage renal disease: Secondary | ICD-10-CM | POA: Diagnosis not present

## 2023-08-19 DIAGNOSIS — D631 Anemia in chronic kidney disease: Secondary | ICD-10-CM | POA: Diagnosis not present

## 2023-08-22 DIAGNOSIS — D631 Anemia in chronic kidney disease: Secondary | ICD-10-CM | POA: Diagnosis not present

## 2023-08-22 DIAGNOSIS — N186 End stage renal disease: Secondary | ICD-10-CM | POA: Diagnosis not present

## 2023-08-22 DIAGNOSIS — N250 Renal osteodystrophy: Secondary | ICD-10-CM | POA: Diagnosis not present

## 2023-08-22 DIAGNOSIS — Z992 Dependence on renal dialysis: Secondary | ICD-10-CM | POA: Diagnosis not present

## 2023-08-22 DIAGNOSIS — D509 Iron deficiency anemia, unspecified: Secondary | ICD-10-CM | POA: Diagnosis not present

## 2023-08-23 DIAGNOSIS — D631 Anemia in chronic kidney disease: Secondary | ICD-10-CM | POA: Diagnosis not present

## 2023-08-23 DIAGNOSIS — I132 Hypertensive heart and chronic kidney disease with heart failure and with stage 5 chronic kidney disease, or end stage renal disease: Secondary | ICD-10-CM | POA: Diagnosis not present

## 2023-08-23 DIAGNOSIS — N186 End stage renal disease: Secondary | ICD-10-CM | POA: Diagnosis not present

## 2023-08-23 DIAGNOSIS — I5022 Chronic systolic (congestive) heart failure: Secondary | ICD-10-CM | POA: Diagnosis not present

## 2023-08-23 DIAGNOSIS — E1122 Type 2 diabetes mellitus with diabetic chronic kidney disease: Secondary | ICD-10-CM | POA: Diagnosis not present

## 2023-08-23 DIAGNOSIS — S72141G Displaced intertrochanteric fracture of right femur, subsequent encounter for closed fracture with delayed healing: Secondary | ICD-10-CM | POA: Diagnosis not present

## 2023-08-24 DIAGNOSIS — D509 Iron deficiency anemia, unspecified: Secondary | ICD-10-CM | POA: Diagnosis not present

## 2023-08-24 DIAGNOSIS — N186 End stage renal disease: Secondary | ICD-10-CM | POA: Diagnosis not present

## 2023-08-24 DIAGNOSIS — D631 Anemia in chronic kidney disease: Secondary | ICD-10-CM | POA: Diagnosis not present

## 2023-08-24 DIAGNOSIS — Z992 Dependence on renal dialysis: Secondary | ICD-10-CM | POA: Diagnosis not present

## 2023-08-26 DIAGNOSIS — N186 End stage renal disease: Secondary | ICD-10-CM | POA: Diagnosis not present

## 2023-08-26 DIAGNOSIS — D631 Anemia in chronic kidney disease: Secondary | ICD-10-CM | POA: Diagnosis not present

## 2023-08-26 DIAGNOSIS — Z992 Dependence on renal dialysis: Secondary | ICD-10-CM | POA: Diagnosis not present

## 2023-08-26 DIAGNOSIS — D509 Iron deficiency anemia, unspecified: Secondary | ICD-10-CM | POA: Diagnosis not present

## 2023-08-30 ENCOUNTER — Encounter (INDEPENDENT_AMBULATORY_CARE_PROVIDER_SITE_OTHER): Payer: Self-pay

## 2023-08-30 ENCOUNTER — Other Ambulatory Visit (INDEPENDENT_AMBULATORY_CARE_PROVIDER_SITE_OTHER): Payer: Self-pay | Admitting: Nurse Practitioner

## 2023-08-30 ENCOUNTER — Ambulatory Visit (INDEPENDENT_AMBULATORY_CARE_PROVIDER_SITE_OTHER)

## 2023-08-30 ENCOUNTER — Encounter (INDEPENDENT_AMBULATORY_CARE_PROVIDER_SITE_OTHER): Payer: Self-pay | Admitting: Nurse Practitioner

## 2023-08-30 ENCOUNTER — Telehealth (INDEPENDENT_AMBULATORY_CARE_PROVIDER_SITE_OTHER): Payer: Self-pay

## 2023-08-30 ENCOUNTER — Ambulatory Visit (INDEPENDENT_AMBULATORY_CARE_PROVIDER_SITE_OTHER): Admitting: Nurse Practitioner

## 2023-08-30 VITALS — BP 84/41 | HR 83 | Resp 18 | Ht 72.0 in | Wt 194.0 lb

## 2023-08-30 DIAGNOSIS — N186 End stage renal disease: Secondary | ICD-10-CM

## 2023-08-30 DIAGNOSIS — T829XXS Unspecified complication of cardiac and vascular prosthetic device, implant and graft, sequela: Secondary | ICD-10-CM

## 2023-08-30 DIAGNOSIS — I120 Hypertensive chronic kidney disease with stage 5 chronic kidney disease or end stage renal disease: Secondary | ICD-10-CM | POA: Diagnosis not present

## 2023-08-30 DIAGNOSIS — I10 Essential (primary) hypertension: Secondary | ICD-10-CM | POA: Diagnosis not present

## 2023-08-30 DIAGNOSIS — E1122 Type 2 diabetes mellitus with diabetic chronic kidney disease: Secondary | ICD-10-CM

## 2023-08-30 DIAGNOSIS — Z992 Dependence on renal dialysis: Secondary | ICD-10-CM

## 2023-08-30 NOTE — Progress Notes (Signed)
 Subjective:    Patient ID: Matthew [name], male    DOB: December 23, 1930, 89 y.o.   MRN: [id number] Chief Complaint  Patient presents with   Follow-up    6 month HDA + see jd/fb- sooner appt per DAVITA- pt family aware to come back at 1:45pm to see FB    The patient presents today for evaluation of his left AV graft.  The patient went to a new dialysis center to have dialysis and he had issues with cannulation and being able to dialyze.  Since that time he has not been able to dialyze for the last several days.  Today ultrasound shows that he has an occluded AV graft.    Review of Systems  Neurological:  Positive for weakness.  All other systems reviewed and are negative.      Objective:   Physical Exam Vitals reviewed.  HENT:     Head: Normocephalic.  Cardiovascular:     Rate and Rhythm: Normal rate.  Pulmonary:     Effort: Pulmonary effort is normal.  Skin:    General: Skin is warm and dry.  Neurological:     Mental Status: He is alert and oriented to person, place, and time.  Psychiatric:        Mood and Affect: Mood normal.        Behavior: Behavior normal.        Thought Content: Thought content normal.        Judgment: Judgment normal.     BP (!) 84/41   Pulse 83   Resp 18   Ht 6' (1.829 m)   Wt 194 lb (88 kg)   BMI 26.31 kg/m   Past Medical History:  Diagnosis Date   Anginal pain (HCC)    Arthritis    Ascending aortic aneurysm (HCC) 07/18/2015   a.) TTE 07/18/2015: severe dilation of ascending aorta measuring 7.0 cm. b.) TTE 01/10/2018: aortic root 3.8 cm; ascending aorta 6.8 cm; refused surgical intervention/repair.   Atrial fibrillation (HCC)    a.) CHA2DS2-VASc = 6 (age x 2, HFrEF, HTN, previous MI, T2DM). b.) rate/rhythm maintained on amiodarone + metoprolol succinate; chronic antiplatelet therapy using clopidogrel   Bradycardia    Coronary artery disease    ESRD (end stage renal disease) (HCC)    GERD (gastroesophageal reflux disease)    HFrEF  (heart failure with reduced ejection fraction) (HCC)    a.) TTE 12/03/2013: mod dec LV function; EF 35-40%; severe inferolateral and inferior HK; LA dilated; G3DD; PASP 52 mmHg. b.) TTE 07/18/2015: mild LV dysfunction with mild LVH; EF 45%; mild BAE. c.) TTE 01/10/2018: mod LV dysfunction; EF 45%; diffuse HK   HLD (hyperlipidemia)    Hypertension    IDA (iron deficiency anemia)    Long term current use of antithrombotics/antiplatelets    a.) clopidogrel   Myocardial infarction (HCC) 2000   Pneumonia    S/P CABG x 4 2001   a.) LIMA-LAD, SVG-D1, SVG-OM1, SVG-OM2, SVG-PDA   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   left frontal scalp, EDC   Squamous cell carcinoma of skin 05/05/2020   left temporal scalp, in situ, EDC 05/13/20   T2DM (type 2 diabetes mellitus) (HCC)    Valvular insufficiency 12/03/2013   a.) TTE 12/03/2013: EF 35-40%; mild AR/MR, b.) TTE 07/18/2015: EF 45%; mild AR/PR, mod TR, severe MR. c.) TTE 01/10/2018: mod AR/TR    Social History   Socioeconomic History   Marital status: Widowed    Spouse name:  Not on file   Number of children: Not on file   Years of education: Not on file   Highest education level: Not on file  Occupational History   Not on file  Tobacco Use   Smoking status: Former    Current packs/day: 0.00    Types: Pipe, Cigarettes    Quit date: 70    Years since quitting: 33.2   Smokeless tobacco: Never  Vaping Use   Vaping status: Never Used  Substance and Sexual Activity   Alcohol use: Not Currently    Alcohol/week: 1.0 standard drink of alcohol    Types: 1 Cans of beer per week    Comment: occasionally drinking only,once a month   Drug use: No   Sexual activity: Never  Other Topics Concern   Not on file  Social History Narrative   Lives with daughter and son in law   Social Drivers of Health   Financial Resource Strain: Low Risk  (01/10/2018)   Overall Financial Resource Strain (CARDIA)    Difficulty of Paying Living Expenses: Not  hard at all  Food Insecurity: No Food Insecurity (02/04/2023)   Hunger Vital Sign    Worried About Running Out of Food in the Last Year: Never true    Ran Out of Food in the Last Year: Never true  Transportation Needs: No Transportation Needs (02/04/2023)   PRAPARE - Administrator, Civil Service (Medical): No    Lack of Transportation (Non-Medical): No  Physical Activity: Inactive (01/10/2018)   Exercise Vital Sign    Days of Exercise per Week: 0 days    Minutes of Exercise per Session: 0 min  Stress: No Stress Concern Present (01/10/2018)   [employer] of Occupational Health - Occupational Stress Questionnaire    Feeling of Stress : Not at all  Social Connections: Socially Integrated (01/10/2018)   Social Connection and Isolation Panel [NHANES]    Frequency of Communication with Friends and Family: More than three times a week    Frequency of Social Gatherings with Friends and Family: More than three times a week    Attends Religious Services: More than 4 times per year    Active Member of Clubs or Organizations: Yes    Attends Banker Meetings: More than 4 times per year    Marital Status: Married  Catering manager Violence: Not At Risk (02/04/2023)   Humiliation, Afraid, Rape, and Kick questionnaire    Fear of Current or Ex-Partner: No    Emotionally Abused: No    Physically Abused: No    Sexually Abused: No    Past Surgical History:  Procedure Laterality Date   A/V FISTULAGRAM Left 02/09/2023   Procedure: A/V Fistulagram;  Surgeon: Annice Needy, MD;  Location: [facility];  Service: Cardiovascular;  Laterality: Left;   A/V SHUNT INTERVENTION N/A 01/25/2023   Procedure: A/V SHUNT INTERVENTION;  Surgeon: Renford Dills, MD;  Location: [facility];  Service: Cardiovascular;  Laterality: N/A;   A/V SHUNTOGRAM Left 04/01/2022   Procedure: A/V SHUNTOGRAM;  Surgeon: Annice Needy, MD;  Location: [facility];  Service:  Cardiovascular;  Laterality: Left;   AV FISTULA PLACEMENT Left 04/15/2021   Procedure: INSERTION OF ARTERIOVENOUS (AV) GORE-TEX GRAFT ARM ( BRACHIAL AXILLARY);  Surgeon: Annice Needy, MD;  Location: [facility];  Service: Vascular;  Laterality: Left;   CATARACT EXTRACTION W/PHACO Left 07/26/2017   Procedure: CATARACT EXTRACTION PHACO AND INTRAOCULAR LENS PLACEMENT (IOC);  Surgeon: Galen Manila, MD;  Location: [facility];  Service: Ophthalmology;  Laterality: Left;  [place]   00:45.8 AP%  13.1 CDE  6.00 Fluid Pack Lot # [id number]   CATARACT EXTRACTION W/PHACO Right 08/17/2017   Procedure: CATARACT EXTRACTION PHACO AND INTRAOCULAR LENS PLACEMENT (IOC);  Surgeon: Galen Manila, MD;  Location: [facility];  Service: Ophthalmology;  Laterality: Right;  [place]  01:30 AP% 16.8 CDE 15.24 Fluid pack lot # [id number]   CHOLECYSTECTOMY     CORONARY ARTERY BYPASS GRAFT N/A 2001   Procedure: 5v CABG (LIMA-LAD, SVG-D1, SVG-OM1, SVG-OM2, SVG-PDA)   DIALYSIS/PERMA CATHETER INSERTION N/A 02/09/2018   Procedure: DIALYSIS/PERMA CATHETER INSERTION;  Surgeon: Annice Needy, MD;  Location: [facility];  Service: Cardiovascular;  Laterality: N/A;   DIALYSIS/PERMA CATHETER INSERTION N/A 02/09/2023   Procedure: DIALYSIS/PERMA CATHETER INSERTION;  Surgeon: Annice Needy, MD;  Location: [facility];  Service: Cardiovascular;  Laterality: N/A;   DIALYSIS/PERMA CATHETER REMOVAL N/A 06/25/2021   Procedure: DIALYSIS/PERMA CATHETER REMOVAL;  Surgeon: Annice Needy, MD;  Location: [facility];  Service: Cardiovascular;  Laterality: N/A;   FRACTURE SURGERY Left 2015   LEFT arm   INSERTION OF DIALYSIS CATHETER Right 11/03/2020   Procedure: INSERTION OF Perm Cath in the RIGHT INTERNAL JUGULAR;  Surgeon: Annice Needy, MD;  Location: [facility];  Service: Vascular;  Laterality: Right;   INTRAMEDULLARY (IM) NAIL INTERTROCHANTERIC Right 11/12/2022   Procedure: INTRAMEDULLARY (IM) NAIL INTERTROCHANTERIC;  Surgeon:  Juanell Fairly, MD;  Location: [facility];  Service: Orthopedics;  Laterality: Right;   LEFT HEART CATHETERIZATION WITH CORONARY/GRAFT ANGIOGRAM Left 12/04/2013   Procedure: LEFT HEART CATHETERIZATION WITH Isabel Caprice;  Surgeon: Marykay Lex, MD;  Location[facility];  Service: Cardiovascular;  Laterality: Left;   LEFT HEART CATHETERIZATION WITH CORONARY/GRAFT ANGIOGRAM Left 02/06/2009   Procedure: LEFT HEART CATHETERIZATION WITH CORONARY/GRAFT ANGIOGRAM; Location: [facility]; Surgeon: Despina Hick, MD   PERCUTANEOUS CORONARY STENT INTERVENTION (PCI-S) N/A 12/06/2013   Procedure: STAGED PERCUTANEOUS CORONARY STENT INTERVENTION (overlapping 4.0 x 38 mm (mid) and 4.0 x 28 mm (ostial) Promus Primier DES to SVG-RPDA graft);  Surgeon: Lesleigh Noe, MD;  Location: [facility] CATH LAB;  Service: Cardiovascular   REMOVAL OF A DIALYSIS CATHETER N/A 11/03/2020   Procedure: REMOVAL OF A DIALYSIS CATHETER;  Surgeon: Annice Needy, MD;  Location: [facility];  Service: Vascular;  Laterality: N/A;   TEMPORARY DIALYSIS CATHETER N/A 02/04/2023   Procedure: TEMPORARY DIALYSIS CATHETER;  Surgeon: Renford Dills, MD;  Location: [facility];  Service: Cardiovascular;  Laterality: N/A;   TOTAL HIP REVISION Right 01/12/2023   Procedure: CONVERSION TO  RIGHT TOTAL HIP REPLACEMENT;  Surgeon: Joen Laura, MD;  Location: [facility];  Service: Orthopedics;  Laterality: Right;    Family History  Problem Relation Age of Onset   Heart attack Father 40       died first MI at age 59   Heart failure Mother     Allergies  Allergen Reactions   Amoxicillin Diarrhea       Latest Ref Rng & Units 02/10/2023    8:58 AM 02/09/2023    8:07 AM 02/09/2023    6:03 AM  CBC  WBC 4.0 - 10.5 K/uL 6.6  5.9  6.0   Hemoglobin 13.0 - 17.0 g/dL 7.9  7.9  8.4   Hematocrit 39.0 - 52.0 % 25.6  24.9  26.7   Platelets 150 - 400 K/uL 183  194  198       CMP     Component Value Date/Time   NA 140 06/30/2023 1009    K 4.8 06/30/2023 1009   CL 96 06/30/2023 1009   CO2 25 06/30/2023 1009   GLUCOSE 106 (H) 06/30/2023 1009   GLUCOSE 101 (H) 02/10/2023 0858   BUN 50 (H) 06/30/2023 1009   CREATININE 3.06 (H) 06/30/2023 1009   CALCIUM 9.7 06/30/2023 1009   PROT 7.2 06/30/2023 1009   ALBUMIN 4.1 06/30/2023 1009   AST 41 (H) 06/30/2023 1009   ALT 45 (H) 06/30/2023 1009   ALKPHOS 246 (H) 06/30/2023 1009   BILITOT 0.7 06/30/2023 1009   EGFR 19 (L) 06/30/2023 1009   GFRNONAA 15 (L) 02/10/2023 0858     No results found.     Assessment & Plan:   1. ESRD (end stage renal disease) (HCC) (Primary) Recommend:  The patient is experiencing increasing problems with their dialysis access.  Patient should have a fistulagram with the intention for intervention.  The intention for intervention is to restore appropriate flow and prevent thrombosis and possible loss of the access.  As well as improve the quality of dialysis therapy.  The risks, benefits and alternative therapies were reviewed in detail with the patient.  All questions were answered.  The patient agrees to proceed with angio/intervention.    The patient will follow up with me in the office after the procedure.   2. 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 [facility]) Continue hypoglycemic medications as already ordered, these medications have been reviewed and there are no changes at this time.  Hgb A1C to be monitored as already arranged by primary service  3. Essential hypertension Continue antihypertensive medications as already ordered, these medications have been reviewed and there are no changes at this time.   Current Outpatient Medications on File Prior to Visit  Medication Sig Dispense Refill   aspirin EC 81 MG tablet Take 81 mg by mouth daily. Swallow whole.     cetirizine (ZYRTEC) 10 MG tablet Take 10 mg by mouth daily.     cholecalciferol (VITAMIN D) 25 MCG (1000 UNIT) tablet Take 1,000 Units by mouth daily with breakfast.      clopidogrel (PLAVIX) 75 MG tablet TAKE 1 TABLET BY MOUTH DAILY 90 tablet 3   Continuous Glucose Receiver (FREESTYLE LIBRE 3 READER) DEVI 1 Device by Does not apply route daily. 1 each 0   Continuous Glucose Sensor (FREESTYLE LIBRE 3 SENSOR) MISC 1 patch by Does not apply route as directed. Place 1 sensor on the skin every 14 days. Use to check glucose continuously 2 each 11   ferrous sulfate 325 (65 FE) MG tablet Take 325 mg by mouth 2 (two) times daily with a meal.      fluticasone (FLONASE) 50 MCG/ACT nasal spray Place 2 sprays into both nostrils daily as needed for allergies or rhinitis.     glipiZIDE 2.5 MG TABS Take 1 tablet by mouth AC breakfast. 90 tablet 1   levothyroxine (SYNTHROID) 100 MCG tablet Take 100 mcg by mouth daily before breakfast.     lidocaine-prilocaine (EMLA) cream Apply 1 Application topically as needed (before dialysis).     midodrine (PROAMATINE) 5 MG tablet Take 1 tablet (5 mg total) by mouth 3 (three) times daily with meals. (Patient taking differently: Take 5 mg by mouth 3 (three) times daily with meals. 5MG  1 HOUR BEFORE DIALYSIS)     multivitamin (RENA-VIT) TABS tablet Take 1 tablet  by mouth daily.     nitroGLYCERIN (NITROSTAT) 0.4 MG SL tablet DISSOLVE 1 TABLET UNDER THE  TONGUE EVERY 5 MINUTES AS NEEDED FOR CHEST PAIN. MAX OF 3 TABLETS IN 15 MINUTES. CALL 911 IF PAIN  PERSISTS. 100 tablet 3   Omega-3 Fatty Acids (FISH OIL) 1000 MG CAPS Take 4,000 mg by mouth daily with lunch.     polyethylene glycol (MIRALAX / GLYCOLAX) 17 g packet Take 17 g by mouth daily. 14 each 0   rosuvastatin (CRESTOR) 40 MG tablet TAKE 1 TABLET BY MOUTH ONCE  DAILY 90 tablet 3   senna-docusate (SENOKOT-S) 8.6-50 MG tablet Take 2 tablets by mouth 2 (two) times daily. 30 tablet 0   No current facility-administered medications on file prior to visit.    There are no Patient Instructions on file for this visit. No follow-ups on file.   Georgiana Spinner, NP

## 2023-08-30 NOTE — Telephone Encounter (Signed)
 Patient has been scheduled for left upper extremity declot with Dr Gilda Crease on [facility] with the arrival time of 1 pm. Patient received letter in the office

## 2023-08-30 NOTE — Telephone Encounter (Signed)
 Matthew [name] called on behalf of her dad. She stated that Matthew [name] usually goes to dialysis on [address] but he was sent to [place] a few days ago, however, 45 mins after they called and stated his shunt was clogged. Matthew [name] stated they have sent a order to [place] 2 times but we haven't received.   After speaking with the daughter her day has been going on 4 days without dialysis.   Per Matthew [name]- The patient should go to the ER

## 2023-08-30 NOTE — H&P (View-Only) (Signed)
 Subjective:    Patient ID: Matthew [name], male    DOB: December 23, 1930, 89 y.o.   MRN: [id number] Chief Complaint  Patient presents with   Follow-up    6 month HDA + see jd/fb- sooner appt per DAVITA- pt family aware to come back at 1:45pm to see FB    The patient presents today for evaluation of his left AV graft.  The patient went to a new dialysis center to have dialysis and he had issues with cannulation and being able to dialyze.  Since that time he has not been able to dialyze for the last several days.  Today ultrasound shows that he has an occluded AV graft.    Review of Systems  Neurological:  Positive for weakness.  All other systems reviewed and are negative.      Objective:   Physical Exam Vitals reviewed.  HENT:     Head: Normocephalic.  Cardiovascular:     Rate and Rhythm: Normal rate.  Pulmonary:     Effort: Pulmonary effort is normal.  Skin:    General: Skin is warm and dry.  Neurological:     Mental Status: He is alert and oriented to person, place, and time.  Psychiatric:        Mood and Affect: Mood normal.        Behavior: Behavior normal.        Thought Content: Thought content normal.        Judgment: Judgment normal.     BP (!) 84/41   Pulse 83   Resp 18   Ht 6' (1.829 m)   Wt 194 lb (88 kg)   BMI 26.31 kg/m   Past Medical History:  Diagnosis Date   Anginal pain (HCC)    Arthritis    Ascending aortic aneurysm (HCC) 07/18/2015   a.) TTE 07/18/2015: severe dilation of ascending aorta measuring 7.0 cm. b.) TTE 01/10/2018: aortic root 3.8 cm; ascending aorta 6.8 cm; refused surgical intervention/repair.   Atrial fibrillation (HCC)    a.) CHA2DS2-VASc = 6 (age x 2, HFrEF, HTN, previous MI, T2DM). b.) rate/rhythm maintained on amiodarone + metoprolol succinate; chronic antiplatelet therapy using clopidogrel   Bradycardia    Coronary artery disease    ESRD (end stage renal disease) (HCC)    GERD (gastroesophageal reflux disease)    HFrEF  (heart failure with reduced ejection fraction) (HCC)    a.) TTE 12/03/2013: mod dec LV function; EF 35-40%; severe inferolateral and inferior HK; LA dilated; G3DD; PASP 52 mmHg. b.) TTE 07/18/2015: mild LV dysfunction with mild LVH; EF 45%; mild BAE. c.) TTE 01/10/2018: mod LV dysfunction; EF 45%; diffuse HK   HLD (hyperlipidemia)    Hypertension    IDA (iron deficiency anemia)    Long term current use of antithrombotics/antiplatelets    a.) clopidogrel   Myocardial infarction (HCC) 2000   Pneumonia    S/P CABG x 4 2001   a.) LIMA-LAD, SVG-D1, SVG-OM1, SVG-OM2, SVG-PDA   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   left frontal scalp, EDC   Squamous cell carcinoma of skin 05/05/2020   left temporal scalp, in situ, EDC 05/13/20   T2DM (type 2 diabetes mellitus) (HCC)    Valvular insufficiency 12/03/2013   a.) TTE 12/03/2013: EF 35-40%; mild AR/MR, b.) TTE 07/18/2015: EF 45%; mild AR/PR, mod TR, severe MR. c.) TTE 01/10/2018: mod AR/TR    Social History   Socioeconomic History   Marital status: Widowed    Spouse name:  Not on file   Number of children: Not on file   Years of education: Not on file   Highest education level: Not on file  Occupational History   Not on file  Tobacco Use   Smoking status: Former    Current packs/day: 0.00    Types: Pipe, Cigarettes    Quit date: 70    Years since quitting: 33.2   Smokeless tobacco: Never  Vaping Use   Vaping status: Never Used  Substance and Sexual Activity   Alcohol use: Not Currently    Alcohol/week: 1.0 standard drink of alcohol    Types: 1 Cans of beer per week    Comment: occasionally drinking only,once a month   Drug use: No   Sexual activity: Never  Other Topics Concern   Not on file  Social History Narrative   Lives with daughter and son in law   Social Drivers of Health   Financial Resource Strain: Low Risk  (01/10/2018)   Overall Financial Resource Strain (CARDIA)    Difficulty of Paying Living Expenses: Not  hard at all  Food Insecurity: No Food Insecurity (02/04/2023)   Hunger Vital Sign    Worried About Running Out of Food in the Last Year: Never true    Ran Out of Food in the Last Year: Never true  Transportation Needs: No Transportation Needs (02/04/2023)   PRAPARE - Administrator, Civil Service (Medical): No    Lack of Transportation (Non-Medical): No  Physical Activity: Inactive (01/10/2018)   Exercise Vital Sign    Days of Exercise per Week: 0 days    Minutes of Exercise per Session: 0 min  Stress: No Stress Concern Present (01/10/2018)   [employer] of Occupational Health - Occupational Stress Questionnaire    Feeling of Stress : Not at all  Social Connections: Socially Integrated (01/10/2018)   Social Connection and Isolation Panel [NHANES]    Frequency of Communication with Friends and Family: More than three times a week    Frequency of Social Gatherings with Friends and Family: More than three times a week    Attends Religious Services: More than 4 times per year    Active Member of Clubs or Organizations: Yes    Attends Banker Meetings: More than 4 times per year    Marital Status: Married  Catering manager Violence: Not At Risk (02/04/2023)   Humiliation, Afraid, Rape, and Kick questionnaire    Fear of Current or Ex-Partner: No    Emotionally Abused: No    Physically Abused: No    Sexually Abused: No    Past Surgical History:  Procedure Laterality Date   A/V FISTULAGRAM Left 02/09/2023   Procedure: A/V Fistulagram;  Surgeon: Annice Needy, MD;  Location: [facility];  Service: Cardiovascular;  Laterality: Left;   A/V SHUNT INTERVENTION N/A 01/25/2023   Procedure: A/V SHUNT INTERVENTION;  Surgeon: Renford Dills, MD;  Location: [facility];  Service: Cardiovascular;  Laterality: N/A;   A/V SHUNTOGRAM Left 04/01/2022   Procedure: A/V SHUNTOGRAM;  Surgeon: Annice Needy, MD;  Location: [facility];  Service:  Cardiovascular;  Laterality: Left;   AV FISTULA PLACEMENT Left 04/15/2021   Procedure: INSERTION OF ARTERIOVENOUS (AV) GORE-TEX GRAFT ARM ( BRACHIAL AXILLARY);  Surgeon: Annice Needy, MD;  Location: [facility];  Service: Vascular;  Laterality: Left;   CATARACT EXTRACTION W/PHACO Left 07/26/2017   Procedure: CATARACT EXTRACTION PHACO AND INTRAOCULAR LENS PLACEMENT (IOC);  Surgeon: Galen Manila, MD;  Location: [facility];  Service: Ophthalmology;  Laterality: Left;  [place]   00:45.8 AP%  13.1 CDE  6.00 Fluid Pack Lot # [id number]   CATARACT EXTRACTION W/PHACO Right 08/17/2017   Procedure: CATARACT EXTRACTION PHACO AND INTRAOCULAR LENS PLACEMENT (IOC);  Surgeon: Galen Manila, MD;  Location: [facility];  Service: Ophthalmology;  Laterality: Right;  [place]  01:30 AP% 16.8 CDE 15.24 Fluid pack lot # [id number]   CHOLECYSTECTOMY     CORONARY ARTERY BYPASS GRAFT N/A 2001   Procedure: 5v CABG (LIMA-LAD, SVG-D1, SVG-OM1, SVG-OM2, SVG-PDA)   DIALYSIS/PERMA CATHETER INSERTION N/A 02/09/2018   Procedure: DIALYSIS/PERMA CATHETER INSERTION;  Surgeon: Annice Needy, MD;  Location: [facility];  Service: Cardiovascular;  Laterality: N/A;   DIALYSIS/PERMA CATHETER INSERTION N/A 02/09/2023   Procedure: DIALYSIS/PERMA CATHETER INSERTION;  Surgeon: Annice Needy, MD;  Location: [facility];  Service: Cardiovascular;  Laterality: N/A;   DIALYSIS/PERMA CATHETER REMOVAL N/A 06/25/2021   Procedure: DIALYSIS/PERMA CATHETER REMOVAL;  Surgeon: Annice Needy, MD;  Location: [facility];  Service: Cardiovascular;  Laterality: N/A;   FRACTURE SURGERY Left 2015   LEFT arm   INSERTION OF DIALYSIS CATHETER Right 11/03/2020   Procedure: INSERTION OF Perm Cath in the RIGHT INTERNAL JUGULAR;  Surgeon: Annice Needy, MD;  Location: [facility];  Service: Vascular;  Laterality: Right;   INTRAMEDULLARY (IM) NAIL INTERTROCHANTERIC Right 11/12/2022   Procedure: INTRAMEDULLARY (IM) NAIL INTERTROCHANTERIC;  Surgeon:  Juanell Fairly, MD;  Location: [facility];  Service: Orthopedics;  Laterality: Right;   LEFT HEART CATHETERIZATION WITH CORONARY/GRAFT ANGIOGRAM Left 12/04/2013   Procedure: LEFT HEART CATHETERIZATION WITH Isabel Caprice;  Surgeon: Marykay Lex, MD;  Location[facility];  Service: Cardiovascular;  Laterality: Left;   LEFT HEART CATHETERIZATION WITH CORONARY/GRAFT ANGIOGRAM Left 02/06/2009   Procedure: LEFT HEART CATHETERIZATION WITH CORONARY/GRAFT ANGIOGRAM; Location: [facility]; Surgeon: Despina Hick, MD   PERCUTANEOUS CORONARY STENT INTERVENTION (PCI-S) N/A 12/06/2013   Procedure: STAGED PERCUTANEOUS CORONARY STENT INTERVENTION (overlapping 4.0 x 38 mm (mid) and 4.0 x 28 mm (ostial) Promus Primier DES to SVG-RPDA graft);  Surgeon: Lesleigh Noe, MD;  Location: [facility] CATH LAB;  Service: Cardiovascular   REMOVAL OF A DIALYSIS CATHETER N/A 11/03/2020   Procedure: REMOVAL OF A DIALYSIS CATHETER;  Surgeon: Annice Needy, MD;  Location: [facility];  Service: Vascular;  Laterality: N/A;   TEMPORARY DIALYSIS CATHETER N/A 02/04/2023   Procedure: TEMPORARY DIALYSIS CATHETER;  Surgeon: Renford Dills, MD;  Location: [facility];  Service: Cardiovascular;  Laterality: N/A;   TOTAL HIP REVISION Right 01/12/2023   Procedure: CONVERSION TO  RIGHT TOTAL HIP REPLACEMENT;  Surgeon: Joen Laura, MD;  Location: [facility];  Service: Orthopedics;  Laterality: Right;    Family History  Problem Relation Age of Onset   Heart attack Father 40       died first MI at age 59   Heart failure Mother     Allergies  Allergen Reactions   Amoxicillin Diarrhea       Latest Ref Rng & Units 02/10/2023    8:58 AM 02/09/2023    8:07 AM 02/09/2023    6:03 AM  CBC  WBC 4.0 - 10.5 K/uL 6.6  5.9  6.0   Hemoglobin 13.0 - 17.0 g/dL 7.9  7.9  8.4   Hematocrit 39.0 - 52.0 % 25.6  24.9  26.7   Platelets 150 - 400 K/uL 183  194  198       CMP     Component Value Date/Time   NA 140 06/30/2023 1009    K 4.8 06/30/2023 1009   CL 96 06/30/2023 1009   CO2 25 06/30/2023 1009   GLUCOSE 106 (H) 06/30/2023 1009   GLUCOSE 101 (H) 02/10/2023 0858   BUN 50 (H) 06/30/2023 1009   CREATININE 3.06 (H) 06/30/2023 1009   CALCIUM 9.7 06/30/2023 1009   PROT 7.2 06/30/2023 1009   ALBUMIN 4.1 06/30/2023 1009   AST 41 (H) 06/30/2023 1009   ALT 45 (H) 06/30/2023 1009   ALKPHOS 246 (H) 06/30/2023 1009   BILITOT 0.7 06/30/2023 1009   EGFR 19 (L) 06/30/2023 1009   GFRNONAA 15 (L) 02/10/2023 0858     No results found.     Assessment & Plan:   1. ESRD (end stage renal disease) (HCC) (Primary) Recommend:  The patient is experiencing increasing problems with their dialysis access.  Patient should have a fistulagram with the intention for intervention.  The intention for intervention is to restore appropriate flow and prevent thrombosis and possible loss of the access.  As well as improve the quality of dialysis therapy.  The risks, benefits and alternative therapies were reviewed in detail with the patient.  All questions were answered.  The patient agrees to proceed with angio/intervention.    The patient will follow up with me in the office after the procedure.   2. 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 [facility]) Continue hypoglycemic medications as already ordered, these medications have been reviewed and there are no changes at this time.  Hgb A1C to be monitored as already arranged by primary service  3. Essential hypertension Continue antihypertensive medications as already ordered, these medications have been reviewed and there are no changes at this time.   Current Outpatient Medications on File Prior to Visit  Medication Sig Dispense Refill   aspirin EC 81 MG tablet Take 81 mg by mouth daily. Swallow whole.     cetirizine (ZYRTEC) 10 MG tablet Take 10 mg by mouth daily.     cholecalciferol (VITAMIN D) 25 MCG (1000 UNIT) tablet Take 1,000 Units by mouth daily with breakfast.      clopidogrel (PLAVIX) 75 MG tablet TAKE 1 TABLET BY MOUTH DAILY 90 tablet 3   Continuous Glucose Receiver (FREESTYLE LIBRE 3 READER) DEVI 1 Device by Does not apply route daily. 1 each 0   Continuous Glucose Sensor (FREESTYLE LIBRE 3 SENSOR) MISC 1 patch by Does not apply route as directed. Place 1 sensor on the skin every 14 days. Use to check glucose continuously 2 each 11   ferrous sulfate 325 (65 FE) MG tablet Take 325 mg by mouth 2 (two) times daily with a meal.      fluticasone (FLONASE) 50 MCG/ACT nasal spray Place 2 sprays into both nostrils daily as needed for allergies or rhinitis.     glipiZIDE 2.5 MG TABS Take 1 tablet by mouth AC breakfast. 90 tablet 1   levothyroxine (SYNTHROID) 100 MCG tablet Take 100 mcg by mouth daily before breakfast.     lidocaine-prilocaine (EMLA) cream Apply 1 Application topically as needed (before dialysis).     midodrine (PROAMATINE) 5 MG tablet Take 1 tablet (5 mg total) by mouth 3 (three) times daily with meals. (Patient taking differently: Take 5 mg by mouth 3 (three) times daily with meals. 5MG  1 HOUR BEFORE DIALYSIS)     multivitamin (RENA-VIT) TABS tablet Take 1 tablet  by mouth daily.     nitroGLYCERIN (NITROSTAT) 0.4 MG SL tablet DISSOLVE 1 TABLET UNDER THE  TONGUE EVERY 5 MINUTES AS NEEDED FOR CHEST PAIN. MAX OF 3 TABLETS IN 15 MINUTES. CALL 911 IF PAIN  PERSISTS. 100 tablet 3   Omega-3 Fatty Acids (FISH OIL) 1000 MG CAPS Take 4,000 mg by mouth daily with lunch.     polyethylene glycol (MIRALAX / GLYCOLAX) 17 g packet Take 17 g by mouth daily. 14 each 0   rosuvastatin (CRESTOR) 40 MG tablet TAKE 1 TABLET BY MOUTH ONCE  DAILY 90 tablet 3   senna-docusate (SENOKOT-S) 8.6-50 MG tablet Take 2 tablets by mouth 2 (two) times daily. 30 tablet 0   No current facility-administered medications on file prior to visit.    There are no Patient Instructions on file for this visit. No follow-ups on file.   Georgiana Spinner, NP

## 2023-08-31 ENCOUNTER — Encounter: Payer: Self-pay | Admitting: Vascular Surgery

## 2023-08-31 ENCOUNTER — Encounter
Admission: RE | Disposition: A | Discharge status: Home / Self Care | Payer: Self-pay | Source: Home / Self Care | Attending: Vascular Surgery

## 2023-08-31 ENCOUNTER — Other Ambulatory Visit: Payer: Self-pay

## 2023-08-31 ENCOUNTER — Ambulatory Visit
Admission: RE | Admit: 2023-08-31 | Discharge: 2023-08-31 | Disposition: A | Discharge status: Home / Self Care | Attending: Vascular Surgery | Admitting: Vascular Surgery

## 2023-08-31 DIAGNOSIS — Y832 Surgical operation with anastomosis, bypass or graft as the cause of abnormal reaction of the patient, or of later complication, without mention of misadventure at the time of the procedure: Secondary | ICD-10-CM | POA: Diagnosis not present

## 2023-08-31 DIAGNOSIS — T82868A Thrombosis of vascular prosthetic devices, implants and grafts, initial encounter: Secondary | ICD-10-CM | POA: Diagnosis not present

## 2023-08-31 DIAGNOSIS — I5022 Chronic systolic (congestive) heart failure: Secondary | ICD-10-CM | POA: Diagnosis not present

## 2023-08-31 DIAGNOSIS — N186 End stage renal disease: Secondary | ICD-10-CM | POA: Diagnosis not present

## 2023-08-31 DIAGNOSIS — Z87891 Personal history of nicotine dependence: Secondary | ICD-10-CM | POA: Diagnosis not present

## 2023-08-31 DIAGNOSIS — Z992 Dependence on renal dialysis: Secondary | ICD-10-CM | POA: Diagnosis not present

## 2023-08-31 DIAGNOSIS — Z7984 Long term (current) use of oral hypoglycemic drugs: Secondary | ICD-10-CM | POA: Insufficient documentation

## 2023-08-31 DIAGNOSIS — Z8249 Family history of ischemic heart disease and other diseases of the circulatory system: Secondary | ICD-10-CM | POA: Diagnosis not present

## 2023-08-31 DIAGNOSIS — E1122 Type 2 diabetes mellitus with diabetic chronic kidney disease: Secondary | ICD-10-CM | POA: Diagnosis not present

## 2023-08-31 DIAGNOSIS — T82858A Stenosis of vascular prosthetic devices, implants and grafts, initial encounter: Secondary | ICD-10-CM

## 2023-08-31 DIAGNOSIS — I132 Hypertensive heart and chronic kidney disease with heart failure and with stage 5 chronic kidney disease, or end stage renal disease: Secondary | ICD-10-CM | POA: Insufficient documentation

## 2023-08-31 HISTORY — PX: PERIPHERAL VASCULAR THROMBECTOMY: CATH118306

## 2023-08-31 LAB — GLUCOSE, CAPILLARY
Glucose-Capillary: 73 mg/dL (ref 70–99)
Glucose-Capillary: 105 mg/dL — ABNORMAL HIGH (ref 70–99)
Glucose-Capillary: 83 mg/dL (ref 70–99)
Glucose-Capillary: 75 mg/dL (ref 70–99)

## 2023-08-31 LAB — POTASSIUM (ARMC VASCULAR LAB ONLY): Potassium (ARMC vascular lab): 5.3 mmol/L — ABNORMAL HIGH (ref 3.5–5.1)

## 2023-08-31 SURGERY — PERIPHERAL VASCULAR THROMBECTOMY
Anesthesia: Moderate Sedation | Laterality: Left

## 2023-08-31 MED ORDER — FENTANYL CITRATE (PF) 100 MCG/2ML IJ SOLN
INTRAMUSCULAR | Status: DC | PRN
  Administered 2023-08-31: 25 ug via INTRAVENOUS
  Administered 2023-08-31: 12.5 ug via INTRAVENOUS

## 2023-08-31 MED ORDER — MIDAZOLAM HCL 2 MG/2ML IJ SOLN
INTRAMUSCULAR | Status: DC | PRN
  Administered 2023-08-31: 1 mg via INTRAVENOUS

## 2023-08-31 MED ORDER — SODIUM CHLORIDE 0.9 % IV BOLUS
INTRAVENOUS | Status: DC | PRN
  Administered 2023-08-31: 250 mL/h via INTRAVENOUS

## 2023-08-31 MED ORDER — HYDROMORPHONE HCL 1 MG/ML IJ SOLN: 1.0000 mg | Freq: Once | INTRAMUSCULAR | Status: DC | PRN

## 2023-08-31 MED ORDER — HEPARIN SODIUM (PORCINE) 1000 UNIT/ML IJ SOLN
INTRAMUSCULAR | Status: AC
  Filled 2023-08-31: qty 10

## 2023-08-31 MED ORDER — DEXTROSE 50 % IV SOLN
25.0000 mL | Freq: Once | INTRAVENOUS | Status: AC
Start: 2023-08-31 — End: 2023-08-31
  Administered 2023-08-31: 25 mL via INTRAVENOUS

## 2023-08-31 MED ORDER — HEPARIN (PORCINE) IN NACL 1000-0.9 UT/500ML-% IV SOLN
INTRAVENOUS | Status: DC | PRN
  Administered 2023-08-31: 500 mL

## 2023-08-31 MED ORDER — HEPARIN SODIUM (PORCINE) 1000 UNIT/ML IJ SOLN
INTRAMUSCULAR | Status: DC | PRN
  Administered 2023-08-31: 5000 [IU] via INTRAVENOUS

## 2023-08-31 MED ORDER — SODIUM CHLORIDE 0.9 % IV SOLN: INTRAVENOUS | Status: DC

## 2023-08-31 MED ORDER — IODIXANOL 320 MG/ML IV SOLN
INTRAVENOUS | Status: DC | PRN
  Administered 2023-08-31: 25 mL

## 2023-08-31 MED ORDER — FAMOTIDINE 20 MG PO TABS: 40.0000 mg | ORAL_TABLET | Freq: Once | ORAL | Status: DC | PRN

## 2023-08-31 MED ORDER — DEXTROSE 50 % IV SOLN
INTRAVENOUS | Status: AC
  Filled 2023-08-31: qty 50

## 2023-08-31 MED ORDER — ONDANSETRON HCL 4 MG/2ML IJ SOLN: 4.0000 mg | Freq: Four times a day (QID) | INTRAMUSCULAR | Status: DC | PRN

## 2023-08-31 MED ORDER — CEFAZOLIN SODIUM-DEXTROSE 1-4 GM/50ML-% IV SOLN
1.0000 g | INTRAVENOUS | Status: AC
  Administered 2023-08-31: 1 g via INTRAVENOUS

## 2023-08-31 MED ORDER — DIPHENHYDRAMINE HCL 50 MG/ML IJ SOLN: 50.0000 mg | Freq: Once | INTRAMUSCULAR | Status: DC | PRN

## 2023-08-31 MED ORDER — MIDAZOLAM HCL 2 MG/2ML IJ SOLN
INTRAMUSCULAR | Status: AC
  Filled 2023-08-31: qty 2

## 2023-08-31 MED ORDER — CEFAZOLIN SODIUM-DEXTROSE 1-4 GM/50ML-% IV SOLN
INTRAVENOUS | Status: AC
  Filled 2023-08-31: qty 50

## 2023-08-31 MED ORDER — MIDAZOLAM HCL 2 MG/ML PO SYRP: 8.0000 mg | ORAL_SOLUTION | Freq: Once | ORAL | Status: DC | PRN

## 2023-08-31 MED ORDER — LIDOCAINE HCL (PF) 1 % IJ SOLN
INTRAMUSCULAR | Status: DC | PRN
  Administered 2023-08-31: 10 mL via INTRADERMAL

## 2023-08-31 MED ORDER — FENTANYL CITRATE PF 50 MCG/ML IJ SOSY
PREFILLED_SYRINGE | INTRAMUSCULAR | Status: AC
  Filled 2023-08-31: qty 1

## 2023-08-31 MED ORDER — METHYLPREDNISOLONE SODIUM SUCC 125 MG IJ SOLR: 125.0000 mg | Freq: Once | INTRAMUSCULAR | Status: DC | PRN

## 2023-08-31 SURGICAL SUPPLY — 21 items
BALLN DORADO 8X100X80 (BALLOONS) ×1 IMPLANT
BALLOON DORADO 8X100X80 (BALLOONS) IMPLANT
CATH BEACON 5 .035 40 KMP TP (CATHETERS) IMPLANT
CATH EMBOLECTOMY 5FR (BALLOONS) IMPLANT
CATH THROMBEC CLEANERXT 6X65 (CATHETERS) IMPLANT
COVER PROBE ULTRASOUND 5X96 (MISCELLANEOUS) IMPLANT
DEVICE PRESTO INFLATION (MISCELLANEOUS) IMPLANT
DRAPE BRACHIAL (DRAPES) IMPLANT
GUIDEWIRE ANGLED .035 180CM (WIRE) IMPLANT
NDL ENTRY 21GA 7CM ECHOTIP (NEEDLE) IMPLANT
NEEDLE ENTRY 21GA 7CM ECHOTIP (NEEDLE) ×1 IMPLANT
PACK ANGIOGRAPHY (CUSTOM PROCEDURE TRAY) ×1 IMPLANT
SET INTRO CAPELLA COAXIAL (SET/KITS/TRAYS/PACK) IMPLANT
SHEATH BRITE TIP 6FRX5.5 (SHEATH) IMPLANT
SHEATH BRITE TIP 7FRX5.5 (SHEATH) IMPLANT
STENT COVERA FLARED 8X80X80 (Permanent Stent) IMPLANT
STENT COVERA STRAIGHT8X60X80 (Permanent Stent) IMPLANT
STENT VIABAHN 8X50X120 (Permanent Stent) IMPLANT
SUT MNCRL AB 4-0 PS2 18 (SUTURE) IMPLANT
WIRE G 018X200 V18 (WIRE) IMPLANT
WIRE SUPRACORE 190CM (WIRE) IMPLANT

## 2023-08-31 NOTE — Op Note (Signed)
 OPERATIVE NOTE   PROCEDURE: Contrast injection left brachial axillary AV graft. Thrombectomy left brachial axillary AV graft. Percutaneous transluminal angioplasty and stent placement left subclavian vein. Percutaneous transluminal angioplasty and stent placement left AV graft peripheral segment  PRE-OPERATIVE DIAGNOSIS: Complication of dialysis access with thrombosis                                                       End Stage Renal Disease  POST-OPERATIVE DIAGNOSIS: same as above   SURGEON: Renford Dills, M.D.  ANESTHESIA: Conscious sedation was administered by the radiology RN under my direct supervision. IV Versed plus fentanyl were utilized. Continuous ECG, pulse oximetry and blood pressure was monitored throughout the entire procedure. Conscious sedation was for a total of 52 minutes.    ESTIMATED BLOOD LOSS: minimal  FINDING(S): Thrombus within the graft.  The distal subclavian vein and proximal axillary vein were occluded.  There was a stenosis in the AV graft proper just before the previously placed stent  SPECIMEN(S):  None  CONTRAST: 25 cc  FLUOROSCOPY TIME: 7.8 minutes  INDICATIONS: Matthew [name] is a 89 y.o. male who  presents with malfunctioning left arm brachial axillary AV access.  The patient is scheduled for angiography with possible intervention of the AV access.  The patient is aware the risks include but are not limited to: bleeding, infection, thrombosis of the cannulated access, and possible anaphylactic reaction to the contrast.  The patient acknowledges if the access can not be salvaged a tunneled catheter will be needed and will be placed during this procedure.  The patient is aware of the risks of the procedure and elects to proceed with the angiogram and intervention.  DESCRIPTION: After full informed written consent was obtained, the patient was brought back to the Special Procedure suite and placed supine position.  Appropriate cardiopulmonary  monitors were placed.  The left arm was prepped and draped in the standard fashion.  Appropriate timeout is called. The left brachial axillary AV graft was cannulated with a micropuncture needle using ultrasound guidance.  With the ultrasound the AV access appeared to be filled with heterogeneous material and was poorly compressible indicating thrombosis of the AV access. The puncture was made under direct ultrasound visualization and an image was recorded for the permanent record.  The microwire was advanced and the needle was exchanged for  a microsheath.  The J-wire was then advanced and a 6 Fr sheath inserted.  Hand was then performed which demonstrated thrombus within the AV access.  The central venous structures were also imaged by hand injections after I was able to cross the occlusion with a Glidewire and a Kumpe catheter.  This initial imaging demonstrated thrombus within the AV graft.  It also demonstrated occlusion of the distal subclavian and proximal axillary vein.  Previously placed stents in the venous outflow are noted.  5000 units of heparin was given and allowed to circulate as well.  A cleaner 6 French device was then advanced beginning centrally and pulling back performing.  Several passes were made through the venous portion of the graft. Follow-up imaging now demonstrates the vast majority of the clot had been treated however the occluded segment remained and there did appear to be some extravasation.  Therefore, the sheath was upsized to a 7 [place] sheath and this area was  covered with Covera stents given the occlusion.  The first stent was an 8 mm x 60 mm straight and then a 8 mm x 80 mm flared Covera was utilized extending into the mid subclavian vein.  The cleaner was introduced once again for 1 more pass within the graft and follow-up imaging demonstrated near total resolution of thrombus with excellent results from the stents.  Therefore a retrograde sheath was inserted. This was a  6 [place] sheath and was positioned more proximally on the arm and angled in the retrograde direction. The left brachial axillary AV graft was cannulated with a micropuncture needle using ultrasound guidance.  With the ultrasound the AV access appeared to be filled with heterogeneous material and was poorly compressible indicating thrombosis of the AV access. The puncture was made under direct ultrasound visualization and an image was recorded for the permanent record.  Subsequently a floppy Glidewire and a KMP catheter were negotiated into the arterial system hand injection contrast was then utilized to demonstrate patency of the artery as well as the location for the anastomosis.  With the wire in the brachial artery an over-the-wire Fogarty was then positioned at the level of the anastomosis and then inflated and pulled back into the venous portion of the AV graft.  A total of 3 passes were made.  The cleaner device was now advanced through the antegrade sheath to treat the residual thrombus. Several passes were made on the venous portion of the AV graft and forward flow was now present.  Hand-injection through the 7 [place] sheath also demonstrated a focal area of narrowing just before the leading edge of the previous stent.  A V18 wire was then advanced through the 7 French sheath and an 8 mm x 50 mm Viabahn was deployed covering this area and postdilated with an 8 mm [place] balloon.  Follow-up imaging now demonstrated resolution of the thrombus there were no hemodynamically significant lesions within the graft and the central venous outflow is now widely patent.  A 4-0 Monocryl purse-string suture was sewn around both of the sheaths.  The sheaths were removed and light pressure was applied.  A sterile bandage was applied to the puncture site.    COMPLICATIONS: None  CONDITION: Almon Register, M.D [facility] Office: [phone]  08/31/2023 5:45 PM

## 2023-08-31 NOTE — Interval H&P Note (Signed)
 History and Physical Interval Note:  08/31/2023 5:44 PM  Matthew [name]  has presented today for surgery, with the diagnosis of Declot L arm    End Stage Renal.  The various methods of treatment have been discussed with the patient and family. After consideration of risks, benefits and other options for treatment, the patient has consented to  Procedure(s): PERIPHERAL VASCULAR THROMBECTOMY (Left) as a surgical intervention.  The patient's history has been reviewed, patient examined, no change in status, stable for surgery.  I have reviewed the patient's chart and labs.  Questions were answered to the patient's satisfaction.     Levora Dredge

## 2023-08-31 NOTE — Interval H&P Note (Signed)
 History and Physical Interval Note:  08/31/2023 5:43 PM  Matthew [name]  has presented today for surgery, with the diagnosis of Declot L arm    End Stage Renal.  The various methods of treatment have been discussed with the patient and family. After consideration of risks, benefits and other options for treatment, the patient has consented to  Procedure(s): PERIPHERAL VASCULAR THROMBECTOMY (Left) as a surgical intervention.  The patient's history has been reviewed, patient examined, no change in status, stable for surgery.  I have reviewed the patient's chart and labs.  Questions were answered to the patient's satisfaction.     Levora Dredge

## 2023-09-01 ENCOUNTER — Encounter: Payer: Self-pay | Admitting: Vascular Surgery

## 2023-09-01 DIAGNOSIS — Z992 Dependence on renal dialysis: Secondary | ICD-10-CM | POA: Diagnosis not present

## 2023-09-01 DIAGNOSIS — D631 Anemia in chronic kidney disease: Secondary | ICD-10-CM | POA: Diagnosis not present

## 2023-09-01 DIAGNOSIS — D509 Iron deficiency anemia, unspecified: Secondary | ICD-10-CM | POA: Diagnosis not present

## 2023-09-01 DIAGNOSIS — N186 End stage renal disease: Secondary | ICD-10-CM | POA: Diagnosis not present

## 2023-09-02 DIAGNOSIS — D631 Anemia in chronic kidney disease: Secondary | ICD-10-CM | POA: Diagnosis not present

## 2023-09-02 DIAGNOSIS — N186 End stage renal disease: Secondary | ICD-10-CM | POA: Diagnosis not present

## 2023-09-02 DIAGNOSIS — Z992 Dependence on renal dialysis: Secondary | ICD-10-CM | POA: Diagnosis not present

## 2023-09-02 DIAGNOSIS — D509 Iron deficiency anemia, unspecified: Secondary | ICD-10-CM | POA: Diagnosis not present

## 2023-09-05 DIAGNOSIS — D631 Anemia in chronic kidney disease: Secondary | ICD-10-CM | POA: Diagnosis not present

## 2023-09-05 DIAGNOSIS — D509 Iron deficiency anemia, unspecified: Secondary | ICD-10-CM | POA: Diagnosis not present

## 2023-09-05 DIAGNOSIS — N186 End stage renal disease: Secondary | ICD-10-CM | POA: Diagnosis not present

## 2023-09-05 DIAGNOSIS — Z992 Dependence on renal dialysis: Secondary | ICD-10-CM | POA: Diagnosis not present

## 2023-09-07 DIAGNOSIS — N186 End stage renal disease: Secondary | ICD-10-CM | POA: Diagnosis not present

## 2023-09-07 DIAGNOSIS — Z992 Dependence on renal dialysis: Secondary | ICD-10-CM | POA: Diagnosis not present

## 2023-09-07 DIAGNOSIS — D631 Anemia in chronic kidney disease: Secondary | ICD-10-CM | POA: Diagnosis not present

## 2023-09-07 DIAGNOSIS — D509 Iron deficiency anemia, unspecified: Secondary | ICD-10-CM | POA: Diagnosis not present

## 2023-09-09 DIAGNOSIS — Z992 Dependence on renal dialysis: Secondary | ICD-10-CM | POA: Diagnosis not present

## 2023-09-09 DIAGNOSIS — N186 End stage renal disease: Secondary | ICD-10-CM | POA: Diagnosis not present

## 2023-09-09 DIAGNOSIS — D509 Iron deficiency anemia, unspecified: Secondary | ICD-10-CM | POA: Diagnosis not present

## 2023-09-09 DIAGNOSIS — D631 Anemia in chronic kidney disease: Secondary | ICD-10-CM | POA: Diagnosis not present

## 2023-09-12 DIAGNOSIS — D509 Iron deficiency anemia, unspecified: Secondary | ICD-10-CM | POA: Diagnosis not present

## 2023-09-12 DIAGNOSIS — N186 End stage renal disease: Secondary | ICD-10-CM | POA: Diagnosis not present

## 2023-09-12 DIAGNOSIS — D631 Anemia in chronic kidney disease: Secondary | ICD-10-CM | POA: Diagnosis not present

## 2023-09-12 DIAGNOSIS — Z992 Dependence on renal dialysis: Secondary | ICD-10-CM | POA: Diagnosis not present

## 2023-09-13 ENCOUNTER — Ambulatory Visit (INDEPENDENT_AMBULATORY_CARE_PROVIDER_SITE_OTHER): Payer: Medicare Other | Admitting: Cardiovascular Disease

## 2023-09-13 ENCOUNTER — Encounter: Payer: Self-pay | Admitting: Cardiovascular Disease

## 2023-09-13 VITALS — BP 92/50 | HR 81 | Ht 72.0 in | Wt 202.0 lb

## 2023-09-13 DIAGNOSIS — I429 Cardiomyopathy, unspecified: Secondary | ICD-10-CM

## 2023-09-13 DIAGNOSIS — Z0130 Encounter for examination of blood pressure without abnormal findings: Secondary | ICD-10-CM

## 2023-09-13 DIAGNOSIS — E782 Mixed hyperlipidemia: Secondary | ICD-10-CM

## 2023-09-13 DIAGNOSIS — I5022 Chronic systolic (congestive) heart failure: Secondary | ICD-10-CM | POA: Diagnosis not present

## 2023-09-13 DIAGNOSIS — I25810 Atherosclerosis of coronary artery bypass graft(s) without angina pectoris: Secondary | ICD-10-CM

## 2023-09-13 DIAGNOSIS — I480 Paroxysmal atrial fibrillation: Secondary | ICD-10-CM

## 2023-09-13 MED ORDER — FUROSEMIDE 20 MG PO TABS: 20.0000 mg | ORAL_TABLET | Freq: Every day | ORAL | 11 refills | Status: DC

## 2023-09-13 NOTE — Progress Notes (Signed)
 Cardiology Office Note   Date:  09/13/2023   ID:  Matthew [name], DOB Feb 11, 1931, MRN [id number]  PCP:  Shari Daughters, MD  Cardiologist:  Debborah Fairly, MD      History of Present Illness: Matthew [name] is a 89 y.o. male who presents for  Chief Complaint  Patient presents with   Follow-up    3 month    Doing well      Past Medical History:  Diagnosis Date   Anginal pain (HCC)    Arthritis    Ascending aortic aneurysm (HCC) 07/18/2015   a.) TTE 07/18/2015: severe dilation of ascending aorta measuring 7.0 cm. b.) TTE 01/10/2018: aortic root 3.8 cm; ascending aorta 6.8 cm; refused surgical intervention/repair.   Atrial fibrillation (HCC)    a.) CHA2DS2-VASc = 6 (age x 2, HFrEF, HTN, previous MI, T2DM). b.) rate/rhythm maintained on amiodarone  + metoprolol  succinate; chronic antiplatelet therapy using clopidogrel    Bradycardia    Coronary artery disease    ESRD (end stage renal disease) (HCC)    GERD (gastroesophageal reflux disease)    HFrEF (heart failure with reduced ejection fraction) (HCC)    a.) TTE 12/03/2013: mod dec LV function; EF 35-40%; severe inferolateral and inferior HK; LA dilated; G3DD; PASP 52 mmHg. b.) TTE 07/18/2015: mild LV dysfunction with mild LVH; EF 45%; mild BAE. 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 c.) TTE  01/10/2018: mod AR/TR     Past Surgical History:  Procedure Laterality Date   A/V FISTULAGRAM Left 02/09/2023   Procedure: A/V Fistulagram;  Surgeon: Celso College, MD;  Location: [facility];  Service: Cardiovascular;  Laterality: Left;   A/V SHUNT INTERVENTION N/A 01/25/2023   Procedure: A/V SHUNT INTERVENTION;  Surgeon: Jackquelyn Mass, MD;  Location: [facility];  Service: Cardiovascular;  Laterality: N/A;   A/V SHUNTOGRAM Left 04/01/2022   Procedure: A/V SHUNTOGRAM;  Surgeon: Celso College, MD;  Location: [facility];  Service: Cardiovascular;  Laterality: Left;   AV FISTULA PLACEMENT Left 04/15/2021   Procedure: INSERTION OF ARTERIOVENOUS (AV) GORE-TEX GRAFT ARM ( BRACHIAL AXILLARY);  Surgeon: Celso College, MD;  Location: [facility];  Service: Vascular;  Laterality: Left;   CATARACT EXTRACTION W/PHACO Left 07/26/2017   Procedure: CATARACT EXTRACTION PHACO AND INTRAOCULAR LENS PLACEMENT (IOC);  Surgeon: Clair Crews, MD;  Location: [facility];  Service: Ophthalmology;  Laterality: Left;  US    00:45.8 AP%  13.1 CDE  6.00 Fluid Pack Lot # [id number]   CATARACT EXTRACTION W/PHACO Right 08/17/2017   Procedure: CATARACT EXTRACTION PHACO AND INTRAOCULAR LENS PLACEMENT (IOC);  Surgeon: Clair Crews, MD;  Location: [facility];  Service: Ophthalmology;  Laterality: Right;  US   01:30 AP% 16.8 CDE 15.24 Fluid pack lot # [id number]  CHOLECYSTECTOMY     CORONARY ARTERY BYPASS GRAFT N/A 2001   Procedure: 5v CABG (LIMA-LAD, SVG-D1, SVG-OM1, SVG-OM2, SVG-PDA)   DIALYSIS/PERMA CATHETER INSERTION N/A 02/09/2018   Procedure: DIALYSIS/PERMA CATHETER INSERTION;  Surgeon: Celso College, MD;  Location: [facility];  Service: Cardiovascular;  Laterality: N/A;   DIALYSIS/PERMA CATHETER INSERTION N/A 02/09/2023   Procedure: DIALYSIS/PERMA CATHETER INSERTION;  Surgeon: Celso College, MD;  Location: [facility];  Service: Cardiovascular;  Laterality: N/A;    DIALYSIS/PERMA CATHETER REMOVAL N/A 06/25/2021   Procedure: DIALYSIS/PERMA CATHETER REMOVAL;  Surgeon: Celso College, MD;  Location: [facility];  Service: Cardiovascular;  Laterality: N/A;   FRACTURE SURGERY Left 2015   LEFT arm   INSERTION OF DIALYSIS CATHETER Right 11/03/2020   Procedure: INSERTION OF Perm Cath in the RIGHT INTERNAL JUGULAR;  Surgeon: Celso College, MD;  Location: [facility];  Service: Vascular;  Laterality: Right;   INTRAMEDULLARY (IM) NAIL INTERTROCHANTERIC Right 11/12/2022   Procedure: INTRAMEDULLARY (IM) NAIL INTERTROCHANTERIC;  Surgeon: Rande Bushy, MD;  Location: [facility];  Service: Orthopedics;  Laterality: Right;   LEFT HEART CATHETERIZATION WITH CORONARY/GRAFT ANGIOGRAM Left 12/04/2013   Procedure: LEFT HEART CATHETERIZATION WITH Estella Helling;  Surgeon: Arleen Lacer, MD;  Location: [facility];  Service: Cardiovascular;  Laterality: Left;   LEFT HEART CATHETERIZATION WITH CORONARY/GRAFT ANGIOGRAM Left 02/06/2009   Procedure: LEFT HEART CATHETERIZATION WITH CORONARY/GRAFT ANGIOGRAM; Location: [facility]; Surgeon: Rinda Cheers, MD   PERCUTANEOUS CORONARY STENT INTERVENTION (PCI-S) N/A 12/06/2013   Procedure: STAGED PERCUTANEOUS CORONARY STENT INTERVENTION (overlapping 4.0 x 38 mm (mid) and 4.0 x 28 mm (ostial) Promus Primier DES to SVG-RPDA graft);  Surgeon: Mickiel Albany, MD;  Location: [facility] CATH [facility];  Service: Cardiovascular   PERIPHERAL VASCULAR THROMBECTOMY Left 08/31/2023   Procedure: PERIPHERAL VASCULAR THROMBECTOMY;  Surgeon: Jackquelyn Mass, MD;  Location: [facility];  Service: Cardiovascular;  Laterality: Left;   REMOVAL OF A DIALYSIS CATHETER N/A 11/03/2020   Procedure: REMOVAL OF A DIALYSIS CATHETER;  Surgeon: Celso College, MD;  Location: [facility];  Service: Vascular;  Laterality: N/A;   TEMPORARY DIALYSIS CATHETER N/A 02/04/2023   Procedure: TEMPORARY DIALYSIS CATHETER;  Surgeon: Jackquelyn Mass, MD;  Location: [facility];  Service: Cardiovascular;  Laterality: N/A;   TOTAL HIP REVISION Right 01/12/2023   Procedure: CONVERSION TO  RIGHT TOTAL HIP REPLACEMENT;  Surgeon: Murleen Arms, MD;  Location: [facility];  Service: Orthopedics;  Laterality: Right;     Current Outpatient Medications  Medication Sig Dispense Refill   aspirin  EC 81 MG tablet Take 81 mg by mouth daily. Swallow whole.     calcium  carbonate (TUMS EX) 750 MG chewable tablet Chew 1 tablet by mouth 3 (three) times daily.     cetirizine (ZYRTEC) 10 MG tablet Take 10 mg by mouth daily.     cholecalciferol  (VITAMIN D ) 25 MCG (1000 UNIT) tablet Take 1,000 Units by mouth daily with breakfast.     clopidogrel  (PLAVIX ) 75 MG tablet TAKE 1 TABLET BY MOUTH DAILY 90 tablet 3   Continuous Glucose Receiver (FREESTYLE LIBRE 3 READER) DEVI 1 Device by Does not apply route daily. 1 each 0   Continuous Glucose Sensor (FREESTYLE LIBRE 3 SENSOR) MISC 1 patch by Does not apply route as directed. Place 1 sensor on the skin every 14 days. Use to check glucose continuously 2 each 11   ferrous sulfate  325 (65 FE) MG tablet Take 325  mg by mouth 2 (two) times daily with a meal.      fluticasone  (FLONASE ) 50 MCG/ACT nasal spray Place 2 sprays into both nostrils daily as needed for allergies or rhinitis.     furosemide  (LASIX ) 20 MG tablet Take 1 tablet (20 mg total) by mouth daily. 30 tablet 11   glipiZIDE  2.5 MG TABS Take 1 tablet by mouth AC breakfast. 90 tablet 1   levothyroxine  (SYNTHROID ) 100 MCG tablet Take 100 mcg by mouth daily before breakfast.     lidocaine -prilocaine  (EMLA ) cream Apply 1 Application topically as needed (before dialysis).     midodrine  (PROAMATINE ) 5 MG tablet Take 1 tablet (5 mg total) by mouth 3 (three) times daily with meals. (Patient taking differently: Take 5 mg by mouth 3 (three) times daily with meals. 5MG  1 HOUR BEFORE DIALYSIS)     multivitamin (RENA-VIT) TABS tablet Take 1 tablet by mouth daily.     nitroGLYCERIN  (NITROSTAT ) 0.4 MG  SL tablet DISSOLVE 1 TABLET UNDER THE  TONGUE EVERY 5 MINUTES AS NEEDED FOR CHEST PAIN. MAX OF 3 TABLETS IN 15 MINUTES. CALL 911 IF PAIN  PERSISTS. 100 tablet 3   Omega-3 Fatty Acids (FISH OIL ) 1000 MG CAPS Take 4,000 mg by mouth daily with lunch.     polyethylene glycol (MIRALAX  / GLYCOLAX ) 17 g packet Take 17 g by mouth daily. 14 each 0   potassium chloride  SA (KLOR-CON  M) 20 MEQ tablet Take 20 mEq by mouth daily.     rosuvastatin  (CRESTOR ) 40 MG tablet TAKE 1 TABLET BY MOUTH ONCE  DAILY 90 tablet 3   senna-docusate (SENOKOT-S) 8.6-50 MG tablet Take 2 tablets by mouth 2 (two) times daily. 30 tablet 0   levocetirizine (XYZAL ) 5 MG tablet Take 10 mg by mouth every evening. (Patient not taking: Reported on 09/13/2023)     traZODone  (DESYREL ) 50 MG tablet Take 50 mg by mouth at bedtime as needed for sleep (1/2 Tablet as needed). (Patient not taking: Reported on 09/13/2023)     No current facility-administered medications for this visit.    Allergies:   Amoxicillin    Social History:   reports that he quit smoking about 33 years ago. His smoking use included pipe and cigarettes. He has never used smokeless tobacco. He reports that he does not currently use alcohol after a past usage of about 1.0 standard drink of alcohol per week. He reports that he does not use drugs.   Family History:  family history includes Heart attack (age of onset: 8) in his father; Heart failure in his mother.    ROS:     Review of Systems  Constitutional: Negative.   HENT: Negative.    Eyes: Negative.   Respiratory: Negative.    Gastrointestinal: Negative.   Genitourinary: Negative.   Musculoskeletal: Negative.   Skin: Negative.   Neurological: Negative.   Endo/Heme/Allergies: Negative.   Psychiatric/Behavioral: Negative.    All other systems reviewed and are negative.     All other systems are reviewed and negative.    PHYSICAL EXAM: VS:  BP (!) 92/50   Pulse 81   Ht 6' (1.829 m)   Wt 202 lb (91.6  kg)   SpO2 98%   BMI 27.40 kg/m  , BMI Body mass index is 27.4 kg/m. Last weight:  Wt Readings from Last 3 Encounters:  09/13/23 202 lb (91.6 kg)  08/31/23 194 lb (88 kg)  08/30/23 194 lb (88 kg)     Physical Exam Vitals reviewed.  Constitutional:      Appearance: Normal appearance. He is normal weight.  HENT:     Head: Normocephalic.     Nose: Nose normal.     Mouth/Throat:     Mouth: Mucous membranes are moist.  Eyes:     Pupils: Pupils are equal, round, and reactive to light.  Cardiovascular:     Rate and Rhythm: Normal rate and regular rhythm.     Pulses: Normal pulses.     Heart sounds: Normal heart sounds.  Pulmonary:     Effort: Pulmonary effort is normal.  Abdominal:     General: Abdomen is flat. Bowel sounds are normal.  Musculoskeletal:        General: Normal range of motion.     Cervical back: Normal range of motion.  Skin:    General: Skin is warm.  Neurological:     General: No focal deficit present.     Mental Status: He is alert.  Psychiatric:        Mood and Affect: Mood normal.       EKG:   Recent Labs: 01/10/2023: Magnesium  1.9 02/10/2023: Hemoglobin 7.9; Platelets 183 06/30/2023: ALT 45; BUN 50; Creatinine, Ser 3.06; Potassium 4.8; Sodium 140; TSH 3.470    Lipid Panel    Component Value Date/Time   CHOL 131 06/30/2023 1009   TRIG 84 06/30/2023 1009   HDL 64 06/30/2023 1009   CHOLHDL 2.0 06/30/2023 1009   LDLCALC 51 06/30/2023 1009      Other studies Reviewed: Additional studies/ records that were reviewed today include:  Review of the above records demonstrates:       No data to display            ASSESSMENT AND PLAN:    ICD-10-CM   1. Coronary artery disease involving coronary bypass graft of native heart without angina pectoris  I25.810 furosemide  (LASIX ) 20 MG tablet   No chest pains or SOB    2. Cardiomyopathy, unspecified type (HCC)  I42.9 furosemide  (LASIX ) 20 MG tablet   BP low, decrease lasix  20 daily    3.  Paroxysmal atrial fibrillation (HCC)  I48.0 furosemide  (LASIX ) 20 MG tablet    4. Chronic systolic CHF (congestive heart failure) (HCC)  I50.22 furosemide  (LASIX ) 20 MG tablet    5. Mixed hyperlipidemia  E78.2 furosemide  (LASIX ) 20 MG tablet       Problem List Items Addressed This Visit       Cardiovascular and Mediastinum   Atrial fibrillation (HCC)   Relevant Medications   furosemide  (LASIX ) 20 MG tablet   Cardiomyopathy (HCC)   Relevant Medications   furosemide  (LASIX ) 20 MG tablet   Coronary artery disease - Primary   Relevant Medications   furosemide  (LASIX ) 20 MG tablet     Other   HLD (hyperlipidemia)   Relevant Medications   furosemide  (LASIX ) 20 MG tablet   Other Visit Diagnoses       Chronic systolic CHF (congestive heart failure) (HCC)       Relevant Medications   furosemide  (LASIX ) 20 MG tablet          Disposition:   Return in about 2 months (around 11/13/2023).    Total time spent: 35 minutes  Signed,  Debborah Fairly, MD  09/13/2023 11:34 AM    [facility]

## 2023-09-14 DIAGNOSIS — D509 Iron deficiency anemia, unspecified: Secondary | ICD-10-CM | POA: Diagnosis not present

## 2023-09-14 DIAGNOSIS — D631 Anemia in chronic kidney disease: Secondary | ICD-10-CM | POA: Diagnosis not present

## 2023-09-14 DIAGNOSIS — Z992 Dependence on renal dialysis: Secondary | ICD-10-CM | POA: Diagnosis not present

## 2023-09-14 DIAGNOSIS — N186 End stage renal disease: Secondary | ICD-10-CM | POA: Diagnosis not present

## 2023-09-16 DIAGNOSIS — Z992 Dependence on renal dialysis: Secondary | ICD-10-CM | POA: Diagnosis not present

## 2023-09-16 DIAGNOSIS — N186 End stage renal disease: Secondary | ICD-10-CM | POA: Diagnosis not present

## 2023-09-16 DIAGNOSIS — D631 Anemia in chronic kidney disease: Secondary | ICD-10-CM | POA: Diagnosis not present

## 2023-09-16 DIAGNOSIS — D509 Iron deficiency anemia, unspecified: Secondary | ICD-10-CM | POA: Diagnosis not present

## 2023-09-19 DIAGNOSIS — Z992 Dependence on renal dialysis: Secondary | ICD-10-CM | POA: Diagnosis not present

## 2023-09-19 DIAGNOSIS — D509 Iron deficiency anemia, unspecified: Secondary | ICD-10-CM | POA: Diagnosis not present

## 2023-09-19 DIAGNOSIS — D631 Anemia in chronic kidney disease: Secondary | ICD-10-CM | POA: Diagnosis not present

## 2023-09-19 DIAGNOSIS — N186 End stage renal disease: Secondary | ICD-10-CM | POA: Diagnosis not present

## 2023-09-21 DIAGNOSIS — Z992 Dependence on renal dialysis: Secondary | ICD-10-CM | POA: Diagnosis not present

## 2023-09-21 DIAGNOSIS — D509 Iron deficiency anemia, unspecified: Secondary | ICD-10-CM | POA: Diagnosis not present

## 2023-09-21 DIAGNOSIS — D631 Anemia in chronic kidney disease: Secondary | ICD-10-CM | POA: Diagnosis not present

## 2023-09-21 DIAGNOSIS — N186 End stage renal disease: Secondary | ICD-10-CM | POA: Diagnosis not present

## 2023-09-22 ENCOUNTER — Other Ambulatory Visit (INDEPENDENT_AMBULATORY_CARE_PROVIDER_SITE_OTHER): Payer: Self-pay | Admitting: Vascular Surgery

## 2023-09-22 DIAGNOSIS — N186 End stage renal disease: Secondary | ICD-10-CM

## 2023-09-23 DIAGNOSIS — D509 Iron deficiency anemia, unspecified: Secondary | ICD-10-CM | POA: Diagnosis not present

## 2023-09-23 DIAGNOSIS — Z992 Dependence on renal dialysis: Secondary | ICD-10-CM | POA: Diagnosis not present

## 2023-09-23 DIAGNOSIS — N186 End stage renal disease: Secondary | ICD-10-CM | POA: Diagnosis not present

## 2023-09-23 DIAGNOSIS — D631 Anemia in chronic kidney disease: Secondary | ICD-10-CM | POA: Diagnosis not present

## 2023-09-23 DIAGNOSIS — E119 Type 2 diabetes mellitus without complications: Secondary | ICD-10-CM | POA: Diagnosis not present

## 2023-09-23 DIAGNOSIS — N2581 Secondary hyperparathyroidism of renal origin: Secondary | ICD-10-CM | POA: Diagnosis not present

## 2023-09-26 DIAGNOSIS — D631 Anemia in chronic kidney disease: Secondary | ICD-10-CM | POA: Diagnosis not present

## 2023-09-26 DIAGNOSIS — N186 End stage renal disease: Secondary | ICD-10-CM | POA: Diagnosis not present

## 2023-09-26 DIAGNOSIS — D509 Iron deficiency anemia, unspecified: Secondary | ICD-10-CM | POA: Diagnosis not present

## 2023-09-26 DIAGNOSIS — Z992 Dependence on renal dialysis: Secondary | ICD-10-CM | POA: Diagnosis not present

## 2023-09-26 DIAGNOSIS — N2581 Secondary hyperparathyroidism of renal origin: Secondary | ICD-10-CM | POA: Diagnosis not present

## 2023-09-28 DIAGNOSIS — D509 Iron deficiency anemia, unspecified: Secondary | ICD-10-CM | POA: Diagnosis not present

## 2023-09-28 DIAGNOSIS — D631 Anemia in chronic kidney disease: Secondary | ICD-10-CM | POA: Diagnosis not present

## 2023-09-28 DIAGNOSIS — Z992 Dependence on renal dialysis: Secondary | ICD-10-CM | POA: Diagnosis not present

## 2023-09-28 DIAGNOSIS — N186 End stage renal disease: Secondary | ICD-10-CM | POA: Diagnosis not present

## 2023-09-28 DIAGNOSIS — N2581 Secondary hyperparathyroidism of renal origin: Secondary | ICD-10-CM | POA: Diagnosis not present

## 2023-09-29 ENCOUNTER — Ambulatory Visit (INDEPENDENT_AMBULATORY_CARE_PROVIDER_SITE_OTHER): Admitting: Vascular Surgery

## 2023-09-29 ENCOUNTER — Other Ambulatory Visit

## 2023-09-29 ENCOUNTER — Encounter (INDEPENDENT_AMBULATORY_CARE_PROVIDER_SITE_OTHER): Payer: Self-pay | Admitting: Vascular Surgery

## 2023-09-29 ENCOUNTER — Ambulatory Visit (INDEPENDENT_AMBULATORY_CARE_PROVIDER_SITE_OTHER)

## 2023-09-29 VITALS — BP 102/62 | HR 89 | Resp 16 | Wt 196.0 lb

## 2023-09-29 DIAGNOSIS — E1122 Type 2 diabetes mellitus with diabetic chronic kidney disease: Secondary | ICD-10-CM

## 2023-09-29 DIAGNOSIS — Z992 Dependence on renal dialysis: Secondary | ICD-10-CM | POA: Diagnosis not present

## 2023-09-29 DIAGNOSIS — I5022 Chronic systolic (congestive) heart failure: Secondary | ICD-10-CM | POA: Diagnosis not present

## 2023-09-29 DIAGNOSIS — E782 Mixed hyperlipidemia: Secondary | ICD-10-CM

## 2023-09-29 DIAGNOSIS — I480 Paroxysmal atrial fibrillation: Secondary | ICD-10-CM

## 2023-09-29 DIAGNOSIS — N186 End stage renal disease: Secondary | ICD-10-CM

## 2023-09-29 DIAGNOSIS — I25810 Atherosclerosis of coronary artery bypass graft(s) without angina pectoris: Secondary | ICD-10-CM

## 2023-09-29 DIAGNOSIS — I120 Hypertensive chronic kidney disease with stage 5 chronic kidney disease or end stage renal disease: Secondary | ICD-10-CM | POA: Diagnosis not present

## 2023-09-30 DIAGNOSIS — N2581 Secondary hyperparathyroidism of renal origin: Secondary | ICD-10-CM | POA: Diagnosis not present

## 2023-09-30 DIAGNOSIS — D631 Anemia in chronic kidney disease: Secondary | ICD-10-CM | POA: Diagnosis not present

## 2023-09-30 DIAGNOSIS — D509 Iron deficiency anemia, unspecified: Secondary | ICD-10-CM | POA: Diagnosis not present

## 2023-09-30 DIAGNOSIS — N186 End stage renal disease: Secondary | ICD-10-CM | POA: Diagnosis not present

## 2023-09-30 DIAGNOSIS — Z992 Dependence on renal dialysis: Secondary | ICD-10-CM | POA: Diagnosis not present

## 2023-09-30 LAB — CBC WITH DIFF/PLATELET
Basophils Absolute: 0.1 10*3/uL (ref 0.0–0.2)
Basos: 1 %
EOS (ABSOLUTE): 0.5 10*3/uL — ABNORMAL HIGH (ref 0.0–0.4)
Eos: 9 %
Hematocrit: 36.3 % — ABNORMAL LOW (ref 37.5–51.0)
Hemoglobin: 11.9 g/dL — ABNORMAL LOW (ref 13.0–17.7)
Immature Grans (Abs): 0 10*3/uL (ref 0.0–0.1)
Immature Granulocytes: 0 %
Lymphocytes Absolute: 1 10*3/uL (ref 0.7–3.1)
Lymphs: 17 %
MCH: 32.6 pg (ref 26.6–33.0)
MCHC: 32.8 g/dL (ref 31.5–35.7)
MCV: 100 fL — ABNORMAL HIGH (ref 79–97)
Monocytes Absolute: 0.6 10*3/uL (ref 0.1–0.9)
Monocytes: 10 %
Neutrophils Absolute: 3.6 10*3/uL (ref 1.4–7.0)
Neutrophils: 63 %
Platelets: 120 10*3/uL — ABNORMAL LOW (ref 150–450)
RBC: 3.65 x10E6/uL — ABNORMAL LOW (ref 4.14–5.80)
RDW: 13 % (ref 11.6–15.4)
WBC: 5.8 10*3/uL (ref 3.4–10.8)

## 2023-09-30 LAB — COMPREHENSIVE METABOLIC PANEL WITH GFR
ALT: 39 IU/L (ref 0–44)
AST: 51 IU/L — ABNORMAL HIGH (ref 0–40)
Albumin: 4.2 g/dL (ref 3.6–4.6)
Alkaline Phosphatase: 402 IU/L — ABNORMAL HIGH (ref 44–121)
BUN/Creatinine Ratio: 19 (ref 10–24)
BUN: 56 mg/dL — ABNORMAL HIGH (ref 10–36)
Bilirubin Total: 0.6 mg/dL (ref 0.0–1.2)
CO2: 24 mmol/L (ref 20–29)
Calcium: 9.4 mg/dL (ref 8.6–10.2)
Chloride: 100 mmol/L (ref 96–106)
Creatinine, Ser: 2.94 mg/dL — ABNORMAL HIGH (ref 0.76–1.27)
Globulin, Total: 2.7 g/dL (ref 1.5–4.5)
Glucose: 96 mg/dL (ref 70–99)
Potassium: 4.6 mmol/L (ref 3.5–5.2)
Sodium: 143 mmol/L (ref 134–144)
Total Protein: 6.9 g/dL (ref 6.0–8.5)
eGFR: 20 mL/min/{1.73_m2} — ABNORMAL LOW (ref >59)

## 2023-09-30 LAB — LIPID PANEL
Chol/HDL Ratio: 1.9 ratio (ref 0.0–5.0)
Cholesterol, Total: 122 mg/dL (ref 100–199)
HDL: 65 mg/dL (ref >39)
LDL Chol Calc (NIH): 45 mg/dL (ref 0–99)
Triglycerides: 53 mg/dL (ref 0–149)
VLDL Cholesterol Cal: 12 mg/dL (ref 5–40)

## 2023-09-30 LAB — HEMOGLOBIN A1C
Est. average glucose Bld gHb Est-mCnc: 123 mg/dL
Hgb A1c MFr Bld: 5.9 % — ABNORMAL HIGH (ref 4.8–5.6)

## 2023-10-02 ENCOUNTER — Encounter (INDEPENDENT_AMBULATORY_CARE_PROVIDER_SITE_OTHER): Payer: Self-pay | Admitting: Vascular Surgery

## 2023-10-02 NOTE — Progress Notes (Signed)
 MRN : [id number]  Matthew [name] is a 89 y.o. ([birth year]) male who presents with chief complaint of check access.  History of Present Illness:  The patient returns to the office for followup status post intervention of their dialysis access.   Following the intervention the access function has significantly improved, with better flow rates and improved KT/V. The patient has not been experiencing increased bleeding times following decannulation and the patient denies increased recirculation. The patient denies an increase in arm swelling. At the present time the patient denies hand pain.  No recent shortening of the patient's walking distance or new symptoms consistent with claudication.  No history of rest pain symptoms. No new ulcers or wounds of the lower extremities have occurred.  The patient denies amaurosis fugax or recent TIA symptoms. There are no recent neurological changes noted. There is no history of DVT, PE or superficial thrombophlebitis. No recent episodes of angina or shortness of breath documented.   Duplex ultrasound of the AV access shows a patent access.  The previously noted stenosis is improved compared to last study.  Flow volume today is 1987 cc/min      Current Meds  Medication Sig   aspirin  EC 81 MG tablet Take 81 mg by mouth daily. Swallow whole.   calcium  carbonate (TUMS EX) 750 MG chewable tablet Chew 1 tablet by mouth 3 (three) times daily.   cetirizine (ZYRTEC) 10 MG tablet Take 10 mg by mouth daily.   cholecalciferol  (VITAMIN D ) 25 MCG (1000 UNIT) tablet Take 1,000 Units by mouth daily with breakfast.   clopidogrel  (PLAVIX ) 75 MG tablet TAKE 1 TABLET BY MOUTH DAILY   Continuous Glucose Receiver (FREESTYLE LIBRE 3 READER) DEVI 1 Device by Does not apply route daily.   Continuous Glucose Sensor (FREESTYLE LIBRE 3 SENSOR) MISC 1 patch by Does not apply route as directed. Place 1 sensor on the skin every 14 days. Use to check glucose  continuously   ferrous sulfate  325 (65 FE) MG tablet Take 325 mg by mouth 2 (two) times daily with a meal.    fluticasone  (FLONASE ) 50 MCG/ACT nasal spray Place 2 sprays into both nostrils daily as needed for allergies or rhinitis.   furosemide  (LASIX ) 20 MG tablet Take 1 tablet (20 mg total) by mouth daily.   glipiZIDE  2.5 MG TABS Take 1 tablet by mouth AC breakfast.   levothyroxine  (SYNTHROID ) 100 MCG tablet Take 100 mcg by mouth daily before breakfast.   lidocaine -prilocaine  (EMLA ) cream Apply 1 Application topically as needed (before dialysis).   midodrine  (PROAMATINE ) 5 MG tablet Take 1 tablet (5 mg total) by mouth 3 (three) times daily with meals. (Patient taking differently: Take 5 mg by mouth 3 (three) times daily with meals. 5MG  1 HOUR BEFORE DIALYSIS)   multivitamin (RENA-VIT) TABS tablet Take 1 tablet by mouth daily.   nitroGLYCERIN  (NITROSTAT ) 0.4 MG SL tablet DISSOLVE 1 TABLET UNDER THE  TONGUE EVERY 5 MINUTES AS NEEDED FOR CHEST PAIN. MAX OF 3 TABLETS IN 15 MINUTES. CALL 911 IF PAIN  PERSISTS.   Omega-3 Fatty Acids (FISH OIL ) 1000 MG CAPS Take 4,000 mg by mouth daily with lunch.   polyethylene glycol (MIRALAX  / GLYCOLAX ) 17 g packet Take 17 g by mouth daily.   potassium chloride  SA (KLOR-CON  M) 20 MEQ tablet Take 20 mEq by mouth daily.   rosuvastatin  (CRESTOR ) 40 MG tablet TAKE  1 TABLET BY MOUTH ONCE  DAILY   senna-docusate (SENOKOT-S) 8.6-50 MG tablet Take 2 tablets by mouth 2 (two) times daily.    Past Medical History:  Diagnosis Date   Anginal pain (HCC)    Arthritis    Ascending aortic aneurysm (HCC) 07/18/2015   a.) TTE 07/18/2015: severe dilation of ascending aorta measuring 7.0 cm. b.) TTE 01/10/2018: aortic root 3.8 cm; ascending aorta 6.8 cm; refused surgical intervention/repair.   Atrial fibrillation (HCC)    a.) CHA2DS2-VASc = 6 (age x 2, HFrEF, HTN, previous MI, T2DM). b.) rate/rhythm maintained on amiodarone  + metoprolol  succinate; chronic antiplatelet therapy using  clopidogrel    Bradycardia    Coronary artery disease    ESRD (end stage renal disease) (HCC)    GERD (gastroesophageal reflux disease)    HFrEF (heart failure with reduced ejection fraction) (HCC)    a.) TTE 12/03/2013: mod dec LV function; EF 35-40%; severe inferolateral and inferior HK; LA dilated; G3DD; PASP 52 mmHg. b.) TTE 07/18/2015: mild LV dysfunction with mild LVH; EF 45%; mild BAE. 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 c.) TTE 01/10/2018: mod AR/TR    Past Surgical History:  Procedure Laterality Date   A/V FISTULAGRAM Left 02/09/2023   Procedure: A/V Fistulagram;  Surgeon: Celso College, MD;  Location: [facility];  Service: Cardiovascular;  Laterality: Left;   A/V SHUNT INTERVENTION N/A 01/25/2023   Procedure: A/V SHUNT INTERVENTION;  Surgeon: Jackquelyn Mass, MD;  Location: [facility];  Service: Cardiovascular;  Laterality: N/A;   A/V SHUNTOGRAM Left 04/01/2022   Procedure: A/V SHUNTOGRAM;  Surgeon: Celso College, MD;  Location: [facility];  Service: Cardiovascular;  Laterality: Left;   AV FISTULA PLACEMENT Left 04/15/2021   Procedure: INSERTION OF ARTERIOVENOUS (AV) GORE-TEX GRAFT ARM ( BRACHIAL AXILLARY);  Surgeon: Celso College, MD;  Location: [facility];  Service: Vascular;  Laterality: Left;   CATARACT EXTRACTION W/PHACO Left  07/26/2017   Procedure: CATARACT EXTRACTION PHACO AND INTRAOCULAR LENS PLACEMENT (IOC);  Surgeon: Clair Crews, MD;  Location: [facility];  Service: Ophthalmology;  Laterality: Left;  US    00:45.8 AP%  13.1 CDE  6.00 Fluid Pack Lot # [id number]   CATARACT EXTRACTION W/PHACO Right 08/17/2017   Procedure: CATARACT EXTRACTION PHACO AND INTRAOCULAR LENS PLACEMENT (IOC);  Surgeon: Clair Crews, MD;  Location: [facility];  Service: Ophthalmology;  Laterality: Right;  US   01:30 AP% 16.8 CDE 15.24 Fluid pack lot # [id number]   CHOLECYSTECTOMY     CORONARY ARTERY BYPASS GRAFT N/A 2001   Procedure: 5v CABG (LIMA-LAD, SVG-D1, SVG-OM1, SVG-OM2, SVG-PDA)   DIALYSIS/PERMA CATHETER INSERTION N/A 02/09/2018   Procedure: DIALYSIS/PERMA CATHETER INSERTION;  Surgeon: Celso College, MD;  Location: [facility];  Service: Cardiovascular;  Laterality:  N/A;   DIALYSIS/PERMA CATHETER INSERTION N/A 02/09/2023   Procedure: DIALYSIS/PERMA CATHETER INSERTION;  Surgeon: Celso College, MD;  Location: [facility];  Service: Cardiovascular;  Laterality: N/A;   DIALYSIS/PERMA CATHETER REMOVAL N/A 06/25/2021   Procedure: DIALYSIS/PERMA CATHETER REMOVAL;  Surgeon: Celso College, MD;  Location: [facility];  Service: Cardiovascular;  Laterality: N/A;   FRACTURE SURGERY Left 2015   LEFT arm   INSERTION OF DIALYSIS CATHETER Right 11/03/2020   Procedure: INSERTION OF Perm Cath in the RIGHT INTERNAL JUGULAR;  Surgeon: Celso College, MD;  Location: [facility];  Service: Vascular;  Laterality: Right;   INTRAMEDULLARY (IM) NAIL INTERTROCHANTERIC Right 11/12/2022   Procedure: INTRAMEDULLARY (IM) NAIL INTERTROCHANTERIC;  Surgeon: Rande Bushy, MD;  Location: [facility];  Service: Orthopedics;  Laterality: Right;   LEFT HEART CATHETERIZATION WITH CORONARY/GRAFT ANGIOGRAM Left 12/04/2013   Procedure: LEFT HEART CATHETERIZATION WITH Estella Helling;  Surgeon: Arleen Lacer, MD;  Location: [facility] CATH LAB;   Service: Cardiovascular;  Laterality: Left;   LEFT HEART CATHETERIZATION WITH CORONARY/GRAFT ANGIOGRAM Left 02/06/2009   Procedure: LEFT HEART CATHETERIZATION WITH CORONARY/GRAFT ANGIOGRAM; Location: [facility]; Surgeon: Rinda Cheers, MD   PERCUTANEOUS CORONARY STENT INTERVENTION (PCI-S) N/A 12/06/2013   Procedure: STAGED PERCUTANEOUS CORONARY STENT INTERVENTION (overlapping 4.0 x 38 mm (mid) and 4.0 x 28 mm (ostial) Promus Primier DES to SVG-RPDA graft);  Surgeon: Mickiel Albany, MD;  Location: [facility];  Service: Cardiovascular   PERIPHERAL VASCULAR THROMBECTOMY Left 08/31/2023   Procedure: PERIPHERAL VASCULAR THROMBECTOMY;  Surgeon: Jackquelyn Mass, MD;  Location: [facility];  Service: Cardiovascular;  Laterality: Left;   REMOVAL OF A DIALYSIS CATHETER N/A 11/03/2020   Procedure: REMOVAL OF A DIALYSIS CATHETER;  Surgeon: Celso College, MD;  Location: [facility];  Service: Vascular;  Laterality: N/A;   TEMPORARY DIALYSIS CATHETER N/A 02/04/2023   Procedure: TEMPORARY DIALYSIS CATHETER;  Surgeon: Jackquelyn Mass, MD;  Location: [facility];  Service: Cardiovascular;  Laterality: N/A;   TOTAL HIP REVISION Right 01/12/2023   Procedure: CONVERSION TO  RIGHT TOTAL HIP REPLACEMENT;  Surgeon: Murleen Arms, MD;  Location: [facility];  Service: Orthopedics;  Laterality: Right;    Social History Social History   Tobacco Use   Smoking status: Former    Current packs/day: 0.00    Types: Pipe, Cigarettes    Quit date: 1992    Years since quitting: 33.3   Smokeless tobacco: Never  Vaping Use   Vaping status: Never Used  Substance Use Topics   Alcohol use: Not Currently    Alcohol/week: 1.0 standard drink of alcohol    Types: 1 Cans of beer per week    Comment: occasionally drinking only,once a month   Drug use: No    Family History Family History  Problem Relation Age of Onset   Heart attack Father 60       died first MI at age 30   Heart failure Mother      Allergies  Allergen Reactions   Amoxicillin Diarrhea     REVIEW OF SYSTEMS (Negative unless checked)  Constitutional: [] Weight loss  [] Fever  [] Chills Cardiac: [] Chest pain   [] Chest pressure   [] Palpitations   [] Shortness of breath when laying flat   [] Shortness of breath with exertion. Vascular:  [] Pain in legs with walking   [] Pain in legs at rest  [] History of DVT   [] Phlebitis   [] Swelling in legs   [] Varicose veins   []   Non-healing ulcers Pulmonary:   [] Uses home oxygen   [] Productive cough   [] Hemoptysis   [] Wheeze  [] COPD   [] Asthma Neurologic:  [] Dizziness   [] Seizures   [] History of stroke   [] History of TIA  [] Aphasia   [] Vissual changes   [] Weakness or numbness in arm   [] Weakness or numbness in leg Musculoskeletal:   [] Joint swelling   [] Joint pain   [] Low back pain Hematologic:  [] Easy bruising  [] Easy bleeding   [] Hypercoagulable state   [] Anemic Gastrointestinal:  [] Diarrhea   [] Vomiting  [] Gastroesophageal reflux/heartburn   [] Difficulty swallowing. Genitourinary:  [x] Chronic kidney disease   [] Difficult urination  [] Frequent urination   [] Blood in urine Skin:  [] Rashes   [] Ulcers  Psychological:  [] History of anxiety   []  History of major depression.  Physical Examination  Vitals:   09/29/23 1517  BP: 102/62  Pulse: 89  Resp: 16  Weight: 196 lb (88.9 kg)   Body mass index is 26.58 kg/m. Gen: WD/WN, NAD Head: [place]/AT, No temporalis wasting.  Ear/Nose/Throat: Hearing grossly intact, nares w/o erythema or drainage Eyes: PER, EOMI, sclera nonicteric.  Neck: Supple, no gross masses or lesions.  No JVD.  Pulmonary:  Good air movement, no audible wheezing, no use of accessory muscles.  Cardiac: RRR, precordium non-hyperdynamic. Vascular:   good thrill good bruit Vessel Right Left  Radial Palpable Palpable  Brachial Palpable Palpable  Gastrointestinal: soft, non-distended. No guarding/no peritoneal signs.  Musculoskeletal: M/S 5/5 throughout.  No deformity.   Neurologic: CN 2-12 intact. Pain and light touch intact in extremities.  Symmetrical.  Speech is fluent. Motor exam as listed above. Psychiatric: Judgment intact, Mood & affect appropriate for pt's clinical situation. Dermatologic: No rashes or ulcers noted.  No changes consistent with cellulitis.   CBC Lab Results  Component Value Date   WBC 5.8 09/29/2023   HGB 11.9 (L) 09/29/2023   HCT 36.3 (L) 09/29/2023   MCV 100 (H) 09/29/2023   PLT 120 (L) 09/29/2023    BMET    Component Value Date/Time   NA 143 09/29/2023 0959   K 4.6 09/29/2023 0959   CL 100 09/29/2023 0959   CO2 24 09/29/2023 0959   GLUCOSE 96 09/29/2023 0959   GLUCOSE 101 (H) 02/10/2023 0858   BUN 56 (H) 09/29/2023 0959   CREATININE 2.94 (H) 09/29/2023 0959   CALCIUM  9.4 09/29/2023 0959   GFRNONAA 15 (L) 02/10/2023 0858   GFRAA 25 (L) 05/26/2019 0503   Estimated Creatinine Clearance: 18.3 mL/min (A) (by C-G formula based on SCr of 2.94 mg/dL (H)).  COAG Lab Results  Component Value Date   INR 1.1 11/12/2022   INR 1.1 02/01/2022   INR 1.1 02/11/2021    Radiology VAS US  DUPLEX DIALYSIS ACCESS (AVF, AVG) Result Date: 09/30/2023 DIALYSIS ACCESS Patient Name:  Matthew [name]  Date of Exam:   09/29/2023 Medical Rec #: [id number]        Accession #:    [id number] Date of Birth: 10-12-1930         Patient Gender: M Patient Age:   71 years Exam Location:  [facility] Procedure:      VAS US  DUPLEX DIALYSIS ACCESS (AVF, AVG) Referring Phys: Devon Fogo --------------------------------------------------------------------------------  Reason for Exam: Routine follow up. Access Site: Left Upper Extremity. Access Type: Upper arm straight graft. History: 08/31/2023 Thrombectomy and stent AVGG and left SCV           04/15/2021 Insertion of left arm AVGG brachial artery to  axillary vein           04/01/2022: Left Brachial Artery to Axillary vein AVG cannulation under          US . Left Arm Shuntogram. Stent placement  of the Venous Anastomosis and          the Axillary vein with a 8 mm diameter by 7.5 cm length Viabahn stent.          01/2023 Mechanical thrombectomy to the left brachial artery to axillary          vein AV graft with the cleaner device          5. Fogarty embolectomy for residual arterial plug          6. Percutaneous transluminal angioplasty of arterial anastomosis with 6          mm diameter by 8 cm length Lutonix drug-coated angioplasty balloon. Performing Technologist: Oneta Bilberry RVT  Examination Guidelines: A complete evaluation includes B-mode imaging, spectral Doppler, color Doppler, and power Doppler as needed of all accessible portions of each vessel. Unilateral testing is considered an integral part of a complete examination. Limited examinations for reoccurring indications may be performed as noted.  Findings:   +--------------------+----------+-----------------+--------+ AVG                 PSV (cm/s)Flow Vol (mL/min)Describe +--------------------+----------+-----------------+--------+ Native artery inflow   276          1987                +--------------------+----------+-----------------+--------+ Arterial anastomosis   512                              +--------------------+----------+-----------------+--------+ Prox graft             326                              +--------------------+----------+-----------------+--------+ Mid graft              256                              +--------------------+----------+-----------------+--------+ Distal graft           223                              +--------------------+----------+-----------------+--------+ Venous anastomosis     221                              +--------------------+----------+-----------------+--------+ Venous outflow         170                              +--------------------+----------+-----------------+--------+  Summary: Patent arteriovenous graft. *See table(s) above for  measurements and observations.  Diagnosing physician: Devon Fogo MD Electronically signed by Devon Fogo MD on 09/30/2023 at 11:20:30 AM.   --------------------------------------------------------------------------------   Final      Assessment/Plan: 1. End-stage renal disease on hemodialysis (HCC) (Primary) Recommend:  The patient is doing well and currently has adequate dialysis access. The patient's dialysis center is not reporting any access issues. Flow pattern is stable when compared to the prior ultrasound.  The patient should have a  duplex ultrasound of the dialysis access in 6 months. The patient will follow-up with me in the office after each ultrasound   - VAS US  DUPLEX DIALYSIS ACCESS (AVF, AVG); Future  2. 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 [facility]) Continue hypoglycemic medications as already ordered, these medications have been reviewed and there are no changes at this time.  Hgb A1C to be monitored as already arranged by primary service  3. Paroxysmal atrial fibrillation (HCC) Continue antiarrhythmia medications as already ordered, these medications have been reviewed and there are no changes at this time.  Continue anticoagulation as ordered by Cardiology Service  4. Coronary artery disease involving coronary bypass graft of native heart without angina pectoris Continue cardiac and antihypertensive medications as already ordered and reviewed, no changes at this time.  Continue statin as ordered and reviewed, no changes at this time  Nitrates PRN for chest pain  5. Mixed hyperlipidemia Continue statin as ordered and reviewed, no changes at this time    Devon Fogo, MD  10/02/2023 12:39 PM

## 2023-10-03 DIAGNOSIS — N2581 Secondary hyperparathyroidism of renal origin: Secondary | ICD-10-CM | POA: Diagnosis not present

## 2023-10-03 DIAGNOSIS — D509 Iron deficiency anemia, unspecified: Secondary | ICD-10-CM | POA: Diagnosis not present

## 2023-10-03 DIAGNOSIS — D631 Anemia in chronic kidney disease: Secondary | ICD-10-CM | POA: Diagnosis not present

## 2023-10-03 DIAGNOSIS — N186 End stage renal disease: Secondary | ICD-10-CM | POA: Diagnosis not present

## 2023-10-03 DIAGNOSIS — Z992 Dependence on renal dialysis: Secondary | ICD-10-CM | POA: Diagnosis not present

## 2023-10-04 ENCOUNTER — Encounter: Payer: Self-pay | Admitting: Internal Medicine

## 2023-10-04 ENCOUNTER — Ambulatory Visit (INDEPENDENT_AMBULATORY_CARE_PROVIDER_SITE_OTHER): Admitting: Internal Medicine

## 2023-10-04 ENCOUNTER — Ambulatory Visit: Payer: Self-pay | Admitting: Internal Medicine

## 2023-10-04 VITALS — BP 105/60 | HR 102 | Temp 96.6°F | Ht 72.0 in | Wt 200.6 lb

## 2023-10-04 DIAGNOSIS — E1122 Type 2 diabetes mellitus with diabetic chronic kidney disease: Secondary | ICD-10-CM | POA: Diagnosis not present

## 2023-10-04 DIAGNOSIS — Z992 Dependence on renal dialysis: Secondary | ICD-10-CM

## 2023-10-04 DIAGNOSIS — I120 Hypertensive chronic kidney disease with stage 5 chronic kidney disease or end stage renal disease: Secondary | ICD-10-CM | POA: Diagnosis not present

## 2023-10-04 DIAGNOSIS — Z0001 Encounter for general adult medical examination with abnormal findings: Secondary | ICD-10-CM

## 2023-10-04 DIAGNOSIS — E782 Mixed hyperlipidemia: Secondary | ICD-10-CM

## 2023-10-04 DIAGNOSIS — N186 End stage renal disease: Secondary | ICD-10-CM

## 2023-10-04 LAB — GLUCOSE, POCT (MANUAL RESULT ENTRY): POC Glucose: 134 mg/dL — AB (ref 70–99)

## 2023-10-04 MED ORDER — ROSUVASTATIN CALCIUM 40 MG PO TABS: 40.0000 mg | ORAL_TABLET | Freq: Every day | ORAL | 3 refills | Status: DC

## 2023-10-04 MED ORDER — GLIPIZIDE 2.5 MG PO TABS: 1.0000 | ORAL_TABLET | Freq: Every morning | ORAL | 1 refills | Status: DC

## 2023-10-04 NOTE — Progress Notes (Signed)
 Established Patient Office Visit  Subjective:  Patient ID: Matthew [name], male    DOB: [birth year]  Age: 89 y.o. MRN: [id number]  Chief Complaint  Patient presents with   Annual Exam    No new complaints, here for AWV refer to quality metrics and scanned documents.   No new complaints, here for lab review and medication refills. Labs reviewed and notable for well controlled diabetes, A1c improved and remains at target, lipids at target with cmp notable for stable renal function. Denies any hypoglycemic episodes and home bg readings have been at target.     No other concerns at this time.   Past Medical History:  Diagnosis Date   Anginal pain (HCC)    Arthritis    Ascending aortic aneurysm (HCC) 07/18/2015   a.) TTE 07/18/2015: severe dilation of ascending aorta measuring 7.0 cm. b.) TTE 01/10/2018: aortic root 3.8 cm; ascending aorta 6.8 cm; refused surgical intervention/repair.   Atrial fibrillation (HCC)    a.) CHA2DS2-VASc = 6 (age x 2, HFrEF, HTN, previous MI, T2DM). b.) rate/rhythm maintained on amiodarone  + metoprolol  succinate; chronic antiplatelet therapy using clopidogrel    Bradycardia    Coronary artery disease    ESRD (end stage renal disease) (HCC)    GERD (gastroesophageal reflux disease)    HFrEF (heart failure with reduced ejection fraction) (HCC)    a.) TTE 12/03/2013: mod dec LV function; EF 35-40%; severe inferolateral and inferior HK; LA dilated; G3DD; PASP 52 mmHg. b.) TTE 07/18/2015: mild LV dysfunction with mild LVH; EF 45%; mild BAE. c.) TTE 01/10/2018: mod LV dysfunction; EF 45%; diffuse HK   HLD (hyperlipidemia)    Hypertension    IDA (iron deficiency anemia)    Long term current use of antithrombotics/antiplatelets    a.) clopidogrel    Myocardial infarction (HCC) 2000   Pneumonia    Samiyah Stupka/P CABG x 4 2001   a.) LIMA-LAD, SVG-D1, SVG-OM1, SVG-OM2, SVG-PDA   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   left frontal scalp, EDC   Squamous cell carcinoma of  skin 05/05/2020   left temporal scalp, in situ, EDC 05/13/20   T2DM (type 2 diabetes mellitus) (HCC)    Valvular insufficiency 12/03/2013   a.) TTE 12/03/2013: EF 35-40%; mild AR/MR, b.) TTE 07/18/2015: EF 45%; mild AR/PR, mod TR, severe MR. c.) TTE 01/10/2018: mod AR/TR    Past Surgical History:  Procedure Laterality Date   A/V FISTULAGRAM Left 02/09/2023   Procedure: A/V Fistulagram;  Surgeon: Celso College, MD;  Location: [facility];  Service: Cardiovascular;  Laterality: Left;   A/V SHUNT INTERVENTION N/A 01/25/2023   Procedure: A/V SHUNT INTERVENTION;  Surgeon: Jackquelyn Mass, MD;  Location: [facility];  Service: Cardiovascular;  Laterality: N/A;   A/V SHUNTOGRAM Left 04/01/2022   Procedure: A/V SHUNTOGRAM;  Surgeon: Celso College, MD;  Location: [facility];  Service: Cardiovascular;  Laterality: Left;   AV FISTULA PLACEMENT Left 04/15/2021   Procedure: INSERTION OF ARTERIOVENOUS (AV) GORE-TEX GRAFT ARM ( BRACHIAL AXILLARY);  Surgeon: Celso College, MD;  Location: [facility];  Service: Vascular;  Laterality: Left;   CATARACT EXTRACTION W/PHACO Left 07/26/2017   Procedure: CATARACT EXTRACTION PHACO AND INTRAOCULAR LENS PLACEMENT (IOC);  Surgeon: Clair Crews, MD;  Location: [facility];  Service: Ophthalmology;  Laterality: Left;  US    00:45.8 AP%  13.1 CDE  6.00 Fluid Pack Lot # [id number]   CATARACT EXTRACTION W/PHACO Right 08/17/2017   Procedure: CATARACT EXTRACTION PHACO AND  INTRAOCULAR LENS PLACEMENT (IOC);  Surgeon: Clair Crews, MD;  Location: [facility];  Service: Ophthalmology;  Laterality: Right;  US   01:30 AP% 16.8 CDE 15.24 Fluid pack lot # [id number]   CHOLECYSTECTOMY     CORONARY ARTERY BYPASS GRAFT N/A 2001   Procedure: 5v CABG (LIMA-LAD, SVG-D1, SVG-OM1, SVG-OM2, SVG-PDA)   DIALYSIS/PERMA CATHETER INSERTION N/A 02/09/2018   Procedure: DIALYSIS/PERMA CATHETER INSERTION;  Surgeon: Celso College, MD;  Location: [facility];  Service:  Cardiovascular;  Laterality: N/A;   DIALYSIS/PERMA CATHETER INSERTION N/A 02/09/2023   Procedure: DIALYSIS/PERMA CATHETER INSERTION;  Surgeon: Celso College, MD;  Location: [facility];  Service: Cardiovascular;  Laterality: N/A;   DIALYSIS/PERMA CATHETER REMOVAL N/A 06/25/2021   Procedure: DIALYSIS/PERMA CATHETER REMOVAL;  Surgeon: Celso College, MD;  Location: [facility];  Service: Cardiovascular;  Laterality: N/A;   FRACTURE SURGERY Left 2015   LEFT arm   INSERTION OF DIALYSIS CATHETER Right 11/03/2020   Procedure: INSERTION OF Perm Cath in the RIGHT INTERNAL JUGULAR;  Surgeon: Celso College, MD;  Location: [facility];  Service: Vascular;  Laterality: Right;   INTRAMEDULLARY (IM) NAIL INTERTROCHANTERIC Right 11/12/2022   Procedure: INTRAMEDULLARY (IM) NAIL INTERTROCHANTERIC;  Surgeon: Rande Bushy, MD;  Location: [facility];  Service: Orthopedics;  Laterality: Right;   LEFT HEART CATHETERIZATION WITH CORONARY/GRAFT ANGIOGRAM Left 12/04/2013   Procedure: LEFT HEART CATHETERIZATION WITH Estella Helling;  Surgeon: Arleen Lacer, MD;  Location: [facility] CATH LAB;  Service: Cardiovascular;  Laterality: Left;   LEFT HEART CATHETERIZATION WITH CORONARY/GRAFT ANGIOGRAM Left 02/06/2009   Procedure: LEFT HEART CATHETERIZATION WITH CORONARY/GRAFT ANGIOGRAM; Location: [facility]; Surgeon: Rinda Cheers, MD   PERCUTANEOUS CORONARY STENT INTERVENTION (PCI-Vandell Kun) N/A 12/06/2013   Procedure: STAGED PERCUTANEOUS CORONARY STENT INTERVENTION (overlapping 4.0 x 38 mm (mid) and 4.0 x 28 mm (ostial) Promus Primier DES to SVG-RPDA graft);  Surgeon: Mickiel Albany, MD;  Location: [facility] CATH LAB;  Service: Cardiovascular   PERIPHERAL VASCULAR THROMBECTOMY Left 08/31/2023   Procedure: PERIPHERAL VASCULAR THROMBECTOMY;  Surgeon: Jackquelyn Mass, MD;  Location: [facility];  Service: Cardiovascular;  Laterality: Left;   REMOVAL OF A DIALYSIS CATHETER N/A 11/03/2020   Procedure: REMOVAL OF A DIALYSIS  CATHETER;  Surgeon: Celso College, MD;  Location: [facility];  Service: Vascular;  Laterality: N/A;   TEMPORARY DIALYSIS CATHETER N/A 02/04/2023   Procedure: TEMPORARY DIALYSIS CATHETER;  Surgeon: Jackquelyn Mass, MD;  Location: [facility];  Service: Cardiovascular;  Laterality: N/A;   TOTAL HIP REVISION Right 01/12/2023   Procedure: CONVERSION TO  RIGHT TOTAL HIP REPLACEMENT;  Surgeon: Murleen Arms, MD;  Location: [facility];  Service: Orthopedics;  Laterality: Right;    Social History   Socioeconomic History   Marital status: Widowed    Spouse name: Not on file   Number of children: Not on file   Years of education: Not on file   Highest education level: Not on file  Occupational History   Not on file  Tobacco Use   Smoking status: Former    Current packs/day: 0.00    Types: Pipe, Cigarettes    Quit date: 55    Years since quitting: 33.3   Smokeless tobacco: Never  Vaping Use   Vaping status: Never Used  Substance and Sexual Activity   Alcohol use: Not Currently    Alcohol/week: 1.0 standard drink of alcohol    Types: 1 Cans of beer per week    Comment:  occasionally drinking only,once a month   Drug use: No   Sexual activity: Never  Other Topics Concern   Not on file  Social History Narrative   Lives with daughter and son in law   Social Drivers of Health   Financial Resource Strain: Low Risk  (01/10/2018)   Overall Financial Resource Strain (CARDIA)    Difficulty of Paying Living Expenses: Not hard at all  Food Insecurity: No Food Insecurity (02/04/2023)   Hunger Vital Sign    Worried About Running Out of Food in the Last Year: Never true    Ran Out of Food in the Last Year: Never true  Transportation Needs: No Transportation Needs (02/04/2023)   PRAPARE - Administrator, Civil Service (Medical): No    Lack of Transportation (Non-Medical): No  Physical Activity: Inactive (01/10/2018)   Exercise Vital Sign    Days of Exercise per Week: 0 days     Minutes of Exercise per Session: 0 min  Stress: No Stress Concern Present (01/10/2018)   [employer] of Occupational Health - Occupational Stress Questionnaire    Feeling of Stress : Not at all  Social Connections: Socially Integrated (01/10/2018)   Social Connection and Isolation Panel [NHANES]    Frequency of Communication with Friends and Family: More than three times a week    Frequency of Social Gatherings with Friends and Family: More than three times a week    Attends Religious Services: More than 4 times per year    Active Member of [employer] or Organizations: Yes    Attends Engineer, structural: More than 4 times per year    Marital Status: Married  Catering manager Violence: Not At Risk (02/04/2023)   Humiliation, Afraid, Rape, and Kick questionnaire    Fear of Current or Ex-Partner: No    Emotionally Abused: No    Physically Abused: No    Sexually Abused: No    Family History  Problem Relation Age of Onset   Heart attack Father 85       died first MI at age 28   Heart failure Mother     Allergies  Allergen Reactions   Amoxicillin Diarrhea    Outpatient Medications Prior to Visit  Medication Sig   aspirin  EC 81 MG tablet Take 81 mg by mouth daily. Swallow whole.   calcium  carbonate (TUMS EX) 750 MG chewable tablet Chew 1 tablet by mouth 3 (three) times daily.   cetirizine (ZYRTEC) 10 MG tablet Take 10 mg by mouth daily.   cholecalciferol  (VITAMIN D ) 25 MCG (1000 UNIT) tablet Take 1,000 Units by mouth daily with breakfast.   clopidogrel  (PLAVIX ) 75 MG tablet TAKE 1 TABLET BY MOUTH DAILY   ferrous sulfate  325 (65 FE) MG tablet Take 325 mg by mouth 2 (two) times daily with a meal.    fluticasone  (FLONASE ) 50 MCG/ACT nasal spray Place 2 sprays into both nostrils daily as needed for allergies or rhinitis.   furosemide  (LASIX ) 20 MG tablet Take 1 tablet (20 mg total) by mouth daily.   levothyroxine  (SYNTHROID ) 100 MCG tablet Take 100 mcg by mouth daily before  breakfast.   lidocaine -prilocaine  (EMLA ) cream Apply 1 Application topically as needed (before dialysis).   midodrine  (PROAMATINE ) 5 MG tablet Take 1 tablet (5 mg total) by mouth 3 (three) times daily with meals. (Patient taking differently: Take 5 mg by mouth 3 (three) times daily with meals. 5MG  1 HOUR BEFORE DIALYSIS)   multivitamin (RENA-VIT) TABS tablet Take  1 tablet by mouth daily.   nitroGLYCERIN  (NITROSTAT ) 0.4 MG SL tablet DISSOLVE 1 TABLET UNDER THE  TONGUE EVERY 5 MINUTES AS NEEDED FOR CHEST PAIN. MAX OF 3 TABLETS IN 15 MINUTES. CALL 911 IF PAIN  PERSISTS.   Omega-3 Fatty Acids (FISH OIL ) 1000 MG CAPS Take 4,000 mg by mouth daily with lunch.   polyethylene glycol (MIRALAX  / GLYCOLAX ) 17 g packet Take 17 g by mouth daily.   potassium chloride  SA (KLOR-CON  M) 20 MEQ tablet Take 20 mEq by mouth daily.   senna-docusate (SENOKOT-Collyns Mcquigg) 8.6-50 MG tablet Take 2 tablets by mouth 2 (two) times daily.   traZODone  (DESYREL ) 50 MG tablet Take 50 mg by mouth at bedtime as needed for sleep (1/2 Tablet as needed).   [DISCONTINUED] glipiZIDE  2.5 MG TABS Take 1 tablet by mouth AC breakfast.   [DISCONTINUED] rosuvastatin  (CRESTOR ) 40 MG tablet TAKE 1 TABLET BY MOUTH ONCE  DAILY   Continuous Glucose Receiver (FREESTYLE LIBRE 3 READER) DEVI 1 Device by Does not apply route daily. (Patient not taking: Reported on 10/04/2023)   Continuous Glucose Sensor (FREESTYLE LIBRE 3 SENSOR) MISC 1 patch by Does not apply route as directed. Place 1 sensor on the skin every 14 days. Use to check glucose continuously (Patient not taking: Reported on 10/04/2023)   levocetirizine (XYZAL ) 5 MG tablet Take 10 mg by mouth every evening. (Patient not taking: Reported on 09/13/2023)   No facility-administered medications prior to visit.    Review of Systems  Constitutional: Negative.   HENT: Negative.    Eyes: Negative.   Respiratory: Negative.    Cardiovascular: Negative.   Gastrointestinal: Negative.   Genitourinary: Negative.    Skin: Negative.   Neurological: Negative.   Endo/Heme/Allergies: Negative.        Objective:   BP 105/60   Pulse (!) 102   Temp (!) 96.6 F (35.9 C)   Ht 6' (1.829 m)   Wt 200 lb 9.6 oz (91 kg)   SpO2 97%   BMI 27.21 kg/m   Vitals:   10/04/23 1325  BP: 105/60  Pulse: (!) 102  Temp: (!) 96.6 F (35.9 C)  Height: 6' (1.829 m)  Weight: 200 lb 9.6 oz (91 kg)  SpO2: 97%  BMI (Calculated): 27.2    Physical Exam Vitals reviewed.  Constitutional:      Appearance: Normal appearance. He is obese.  HENT:     Head: Normocephalic.     Left Ear: There is no impacted cerumen.     Nose: Nose normal.     Mouth/Throat:     Mouth: Mucous membranes are moist.     Pharynx: No posterior oropharyngeal erythema.  Eyes:     Extraocular Movements: Extraocular movements intact.     Pupils: Pupils are equal, round, and reactive to light.  Cardiovascular:     Rate and Rhythm: Regular rhythm.     Chest Wall: PMI is not displaced.     Pulses: Normal pulses.     Heart sounds: Normal heart sounds. No murmur heard. Pulmonary:     Effort: Pulmonary effort is normal.     Breath sounds: Normal air entry. No rhonchi or rales.  Abdominal:     General: Abdomen is flat. Bowel sounds are normal. There is no distension.     Palpations: Abdomen is soft. There is no hepatomegaly, splenomegaly or mass.     Tenderness: There is no abdominal tenderness.  Musculoskeletal:        General: Normal range of  motion.     Cervical back: Normal range of motion and neck supple.     Right lower leg: No edema.     Left lower leg: No edema.  Skin:    General: Skin is warm and dry.  Neurological:     General: No focal deficit present.     Mental Status: He is alert and oriented to person, place, and time.     Cranial Nerves: No cranial nerve deficit.     Motor: No weakness.  Psychiatric:        Mood and Affect: Mood normal.        Behavior: Behavior normal.      Results for orders placed or  performed in visit on 10/04/23  POCT Glucose (CBG)  Result Value Ref Range   POC Glucose 134 (A) 70 - 99 mg/dl    Recent Results (from the past 2160 hours)  POCT CBG (Fasting - Glucose)     Status: Abnormal   Collection Time: 08/02/23  1:09 PM  Result Value Ref Range   Glucose Fasting, POC 124 (A) 70 - 99 mg/dL  Potassium [facility] vascular lab only)     Status: Abnormal   Collection Time: 08/31/23  1:18 PM  Result Value Ref Range   Potassium [facility] vascular lab) 5.3 (H) 3.5 - 5.1 mmol/L    Comment: Performed at [facility], [address] [place], [place] [address]  Glucose, capillary     Status: None   Collection Time: 08/31/23  1:23 PM  Result Value Ref Range   Glucose-Capillary 73 70 - 99 mg/dL    Comment: Glucose reference range applies only to samples taken after fasting for at least 8 hours.  Glucose, capillary     Status: Abnormal   Collection Time: 08/31/23  1:52 PM  Result Value Ref Range   Glucose-Capillary 105 (H) 70 - 99 mg/dL    Comment: Glucose reference range applies only to samples taken after fasting for at least 8 hours.  Glucose, capillary     Status: None   Collection Time: 08/31/23  4:06 PM  Result Value Ref Range   Glucose-Capillary 83 70 - 99 mg/dL    Comment: Glucose reference range applies only to samples taken after fasting for at least 8 hours.  Glucose, capillary     Status: None   Collection Time: 08/31/23  5:38 PM  Result Value Ref Range   Glucose-Capillary 75 70 - 99 mg/dL    Comment: Glucose reference range applies only to samples taken after fasting for at least 8 hours.  Comprehensive metabolic panel     Status: Abnormal   Collection Time: 09/29/23  9:59 AM  Result Value Ref Range   Glucose 96 70 - 99 mg/dL   BUN 56 (H) 10 - 36 mg/dL   Creatinine, Ser 6.04 (H) 0.76 - 1.27 mg/dL   eGFR 20 (L) >54 UJ/WJX/9.14   BUN/Creatinine Ratio 19 10 - 24   Sodium 143 134 - 144 mmol/L   Potassium 4.6 3.5 - 5.2 mmol/L   Chloride 100 96 - 106  mmol/L   CO2 24 20 - 29 mmol/L   Calcium  9.4 8.6 - 10.2 mg/dL   Total Protein 6.9 6.0 - 8.5 g/dL   Albumin  4.2 3.6 - 4.6 g/dL   Globulin, Total 2.7 1.5 - 4.5 g/dL   Bilirubin Total 0.6 0.0 - 1.2 mg/dL   Alkaline Phosphatase 402 (H) 44 - 121 IU/L   AST 51 (H) 0 -  40 IU/L   ALT 39 0 - 44 IU/L  Lipid panel     Status: None   Collection Time: 09/29/23  9:59 AM  Result Value Ref Range   Cholesterol, Total 122 100 - 199 mg/dL   Triglycerides 53 0 - 149 mg/dL   HDL 65 >40 mg/dL   VLDL Cholesterol Cal 12 5 - 40 mg/dL   LDL Chol Calc (NIH) 45 0 - 99 mg/dL   Chol/HDL Ratio 1.9 0.0 - 5.0 ratio    Comment:                                   T. Chol/HDL Ratio                                             Men  Women                               1/2 Avg.Risk  3.4    3.3                                   Avg.Risk  5.0    4.4                                2X Avg.Risk  9.6    7.1                                3X Avg.Risk 23.4   11.0   CBC With Diff/Platelet     Status: Abnormal   Collection Time: 09/29/23  9:59 AM  Result Value Ref Range   WBC 5.8 3.4 - 10.8 x10E3/uL   RBC 3.65 (L) 4.14 - 5.80 x10E6/uL   Hemoglobin 11.9 (L) 13.0 - 17.7 g/dL   Hematocrit 08.6 (L) 76.1 - 51.0 %   MCV 100 (H) 79 - 97 fL   MCH 32.6 26.6 - 33.0 pg   MCHC 32.8 31.5 - 35.7 g/dL   RDW 95.0 93.2 - 67.1 %   Platelets 120 (L) 150 - 450 x10E3/uL   Neutrophils 63 Not Estab. %   Lymphs 17 Not Estab. %   Monocytes 10 Not Estab. %   Eos 9 Not Estab. %   Basos 1 Not Estab. %   Neutrophils Absolute 3.6 1.4 - 7.0 x10E3/uL   Lymphocytes Absolute 1.0 0.7 - 3.1 x10E3/uL   Monocytes Absolute 0.6 0.1 - 0.9 x10E3/uL   EOS (ABSOLUTE) 0.5 (H) 0.0 - 0.4 x10E3/uL   Basophils Absolute 0.1 0.0 - 0.2 x10E3/uL   Immature Granulocytes 0 Not Estab. %   Immature Grans (Abs) 0.0 0.0 - 0.1 x10E3/uL  Hemoglobin A1c     Status: Abnormal   Collection Time: 09/29/23  9:59 AM  Result Value Ref Range   Hgb A1c MFr Bld 5.9 (H) 4.8 - 5.6 %     Comment:          Prediabetes: 5.7 - 6.4          Diabetes: >6.4  Glycemic control for adults with diabetes: <7.0    Est. average glucose Bld gHb Est-mCnc 123 mg/dL  POCT Glucose (CBG)     Status: Abnormal   Collection Time: 10/04/23  1:36 PM  Result Value Ref Range   POC Glucose 134 (A) 70 - 99 mg/dl      Assessment & Plan:  As per problem list.  Problem List Items Addressed This Visit       Cardiovascular and Mediastinum   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) - Primary   Relevant Medications   glipiZIDE  2.5 MG TABS   rosuvastatin  (CRESTOR ) 40 MG tablet   Other Relevant Orders   POCT Glucose (CBG) (Completed)   Hemoglobin A1c     Other   HLD (hyperlipidemia)   Relevant Medications   rosuvastatin  (CRESTOR ) 40 MG tablet   Other Relevant Orders   Lipid panel    Return in 3 months (on 01/04/2024) for fu with labs prior.   Total time spent: 20 minutes  Arzella Bitters, MD  10/04/2023   This document may have been prepared by [facility] Voice Recognition software and as such may include unintentional dictation errors.

## 2023-10-05 DIAGNOSIS — Z992 Dependence on renal dialysis: Secondary | ICD-10-CM | POA: Diagnosis not present

## 2023-10-05 DIAGNOSIS — D631 Anemia in chronic kidney disease: Secondary | ICD-10-CM | POA: Diagnosis not present

## 2023-10-05 DIAGNOSIS — N186 End stage renal disease: Secondary | ICD-10-CM | POA: Diagnosis not present

## 2023-10-05 DIAGNOSIS — N2581 Secondary hyperparathyroidism of renal origin: Secondary | ICD-10-CM | POA: Diagnosis not present

## 2023-10-05 DIAGNOSIS — D509 Iron deficiency anemia, unspecified: Secondary | ICD-10-CM | POA: Diagnosis not present

## 2023-10-07 DIAGNOSIS — Z992 Dependence on renal dialysis: Secondary | ICD-10-CM | POA: Diagnosis not present

## 2023-10-07 DIAGNOSIS — N186 End stage renal disease: Secondary | ICD-10-CM | POA: Diagnosis not present

## 2023-10-07 DIAGNOSIS — N2581 Secondary hyperparathyroidism of renal origin: Secondary | ICD-10-CM | POA: Diagnosis not present

## 2023-10-07 DIAGNOSIS — D509 Iron deficiency anemia, unspecified: Secondary | ICD-10-CM | POA: Diagnosis not present

## 2023-10-07 DIAGNOSIS — D631 Anemia in chronic kidney disease: Secondary | ICD-10-CM | POA: Diagnosis not present

## 2023-10-10 DIAGNOSIS — N2581 Secondary hyperparathyroidism of renal origin: Secondary | ICD-10-CM | POA: Diagnosis not present

## 2023-10-10 DIAGNOSIS — Z992 Dependence on renal dialysis: Secondary | ICD-10-CM | POA: Diagnosis not present

## 2023-10-10 DIAGNOSIS — N186 End stage renal disease: Secondary | ICD-10-CM | POA: Diagnosis not present

## 2023-10-10 DIAGNOSIS — D631 Anemia in chronic kidney disease: Secondary | ICD-10-CM | POA: Diagnosis not present

## 2023-10-10 DIAGNOSIS — D509 Iron deficiency anemia, unspecified: Secondary | ICD-10-CM | POA: Diagnosis not present

## 2023-10-11 ENCOUNTER — Encounter (INDEPENDENT_AMBULATORY_CARE_PROVIDER_SITE_OTHER): Payer: Self-pay

## 2023-10-12 DIAGNOSIS — D509 Iron deficiency anemia, unspecified: Secondary | ICD-10-CM | POA: Diagnosis not present

## 2023-10-12 DIAGNOSIS — D631 Anemia in chronic kidney disease: Secondary | ICD-10-CM | POA: Diagnosis not present

## 2023-10-12 DIAGNOSIS — Z992 Dependence on renal dialysis: Secondary | ICD-10-CM | POA: Diagnosis not present

## 2023-10-12 DIAGNOSIS — N2581 Secondary hyperparathyroidism of renal origin: Secondary | ICD-10-CM | POA: Diagnosis not present

## 2023-10-12 DIAGNOSIS — N186 End stage renal disease: Secondary | ICD-10-CM | POA: Diagnosis not present

## 2023-10-14 DIAGNOSIS — D631 Anemia in chronic kidney disease: Secondary | ICD-10-CM | POA: Diagnosis not present

## 2023-10-14 DIAGNOSIS — N2581 Secondary hyperparathyroidism of renal origin: Secondary | ICD-10-CM | POA: Diagnosis not present

## 2023-10-14 DIAGNOSIS — Z992 Dependence on renal dialysis: Secondary | ICD-10-CM | POA: Diagnosis not present

## 2023-10-14 DIAGNOSIS — N186 End stage renal disease: Secondary | ICD-10-CM | POA: Diagnosis not present

## 2023-10-14 DIAGNOSIS — D509 Iron deficiency anemia, unspecified: Secondary | ICD-10-CM | POA: Diagnosis not present

## 2023-10-17 DIAGNOSIS — N186 End stage renal disease: Secondary | ICD-10-CM | POA: Diagnosis not present

## 2023-10-17 DIAGNOSIS — D631 Anemia in chronic kidney disease: Secondary | ICD-10-CM | POA: Diagnosis not present

## 2023-10-17 DIAGNOSIS — Z992 Dependence on renal dialysis: Secondary | ICD-10-CM | POA: Diagnosis not present

## 2023-10-17 DIAGNOSIS — N2581 Secondary hyperparathyroidism of renal origin: Secondary | ICD-10-CM | POA: Diagnosis not present

## 2023-10-17 DIAGNOSIS — D509 Iron deficiency anemia, unspecified: Secondary | ICD-10-CM | POA: Diagnosis not present

## 2023-10-19 DIAGNOSIS — Z992 Dependence on renal dialysis: Secondary | ICD-10-CM | POA: Diagnosis not present

## 2023-10-19 DIAGNOSIS — D509 Iron deficiency anemia, unspecified: Secondary | ICD-10-CM | POA: Diagnosis not present

## 2023-10-19 DIAGNOSIS — N2581 Secondary hyperparathyroidism of renal origin: Secondary | ICD-10-CM | POA: Diagnosis not present

## 2023-10-19 DIAGNOSIS — D631 Anemia in chronic kidney disease: Secondary | ICD-10-CM | POA: Diagnosis not present

## 2023-10-19 DIAGNOSIS — N186 End stage renal disease: Secondary | ICD-10-CM | POA: Diagnosis not present

## 2023-10-22 DIAGNOSIS — N186 End stage renal disease: Secondary | ICD-10-CM | POA: Diagnosis not present

## 2023-10-22 DIAGNOSIS — Z992 Dependence on renal dialysis: Secondary | ICD-10-CM | POA: Diagnosis not present

## 2023-10-24 DIAGNOSIS — D631 Anemia in chronic kidney disease: Secondary | ICD-10-CM | POA: Diagnosis not present

## 2023-10-24 DIAGNOSIS — N186 End stage renal disease: Secondary | ICD-10-CM | POA: Diagnosis not present

## 2023-10-24 DIAGNOSIS — D509 Iron deficiency anemia, unspecified: Secondary | ICD-10-CM | POA: Diagnosis not present

## 2023-10-24 DIAGNOSIS — Z992 Dependence on renal dialysis: Secondary | ICD-10-CM | POA: Diagnosis not present

## 2023-10-24 DIAGNOSIS — N250 Renal osteodystrophy: Secondary | ICD-10-CM | POA: Diagnosis not present

## 2023-10-26 DIAGNOSIS — N250 Renal osteodystrophy: Secondary | ICD-10-CM | POA: Diagnosis not present

## 2023-10-26 DIAGNOSIS — N186 End stage renal disease: Secondary | ICD-10-CM | POA: Diagnosis not present

## 2023-10-26 DIAGNOSIS — D631 Anemia in chronic kidney disease: Secondary | ICD-10-CM | POA: Diagnosis not present

## 2023-10-26 DIAGNOSIS — Z992 Dependence on renal dialysis: Secondary | ICD-10-CM | POA: Diagnosis not present

## 2023-10-26 DIAGNOSIS — D509 Iron deficiency anemia, unspecified: Secondary | ICD-10-CM | POA: Diagnosis not present

## 2023-10-27 ENCOUNTER — Ambulatory Visit (INDEPENDENT_AMBULATORY_CARE_PROVIDER_SITE_OTHER): Payer: Medicare Other | Admitting: Vascular Surgery

## 2023-10-27 ENCOUNTER — Encounter (INDEPENDENT_AMBULATORY_CARE_PROVIDER_SITE_OTHER): Payer: Medicare Other

## 2023-10-28 DIAGNOSIS — D631 Anemia in chronic kidney disease: Secondary | ICD-10-CM | POA: Diagnosis not present

## 2023-10-28 DIAGNOSIS — N250 Renal osteodystrophy: Secondary | ICD-10-CM | POA: Diagnosis not present

## 2023-10-28 DIAGNOSIS — Z992 Dependence on renal dialysis: Secondary | ICD-10-CM | POA: Diagnosis not present

## 2023-10-28 DIAGNOSIS — N186 End stage renal disease: Secondary | ICD-10-CM | POA: Diagnosis not present

## 2023-10-28 DIAGNOSIS — D509 Iron deficiency anemia, unspecified: Secondary | ICD-10-CM | POA: Diagnosis not present

## 2023-10-30 DIAGNOSIS — Z23 Encounter for immunization: Secondary | ICD-10-CM | POA: Diagnosis not present

## 2023-10-31 ENCOUNTER — Other Ambulatory Visit: Payer: Self-pay | Admitting: Internal Medicine

## 2023-10-31 DIAGNOSIS — Z992 Dependence on renal dialysis: Secondary | ICD-10-CM | POA: Diagnosis not present

## 2023-10-31 DIAGNOSIS — N186 End stage renal disease: Secondary | ICD-10-CM | POA: Diagnosis not present

## 2023-10-31 DIAGNOSIS — N250 Renal osteodystrophy: Secondary | ICD-10-CM | POA: Diagnosis not present

## 2023-10-31 DIAGNOSIS — D509 Iron deficiency anemia, unspecified: Secondary | ICD-10-CM | POA: Diagnosis not present

## 2023-10-31 DIAGNOSIS — D631 Anemia in chronic kidney disease: Secondary | ICD-10-CM | POA: Diagnosis not present

## 2023-11-02 DIAGNOSIS — D631 Anemia in chronic kidney disease: Secondary | ICD-10-CM | POA: Diagnosis not present

## 2023-11-02 DIAGNOSIS — D509 Iron deficiency anemia, unspecified: Secondary | ICD-10-CM | POA: Diagnosis not present

## 2023-11-02 DIAGNOSIS — Z992 Dependence on renal dialysis: Secondary | ICD-10-CM | POA: Diagnosis not present

## 2023-11-02 DIAGNOSIS — N186 End stage renal disease: Secondary | ICD-10-CM | POA: Diagnosis not present

## 2023-11-02 DIAGNOSIS — N250 Renal osteodystrophy: Secondary | ICD-10-CM | POA: Diagnosis not present

## 2023-11-04 DIAGNOSIS — D509 Iron deficiency anemia, unspecified: Secondary | ICD-10-CM | POA: Diagnosis not present

## 2023-11-04 DIAGNOSIS — Z992 Dependence on renal dialysis: Secondary | ICD-10-CM | POA: Diagnosis not present

## 2023-11-04 DIAGNOSIS — N186 End stage renal disease: Secondary | ICD-10-CM | POA: Diagnosis not present

## 2023-11-04 DIAGNOSIS — N250 Renal osteodystrophy: Secondary | ICD-10-CM | POA: Diagnosis not present

## 2023-11-04 DIAGNOSIS — D631 Anemia in chronic kidney disease: Secondary | ICD-10-CM | POA: Diagnosis not present

## 2023-11-07 DIAGNOSIS — N186 End stage renal disease: Secondary | ICD-10-CM | POA: Diagnosis not present

## 2023-11-07 DIAGNOSIS — Z992 Dependence on renal dialysis: Secondary | ICD-10-CM | POA: Diagnosis not present

## 2023-11-10 DIAGNOSIS — I509 Heart failure, unspecified: Secondary | ICD-10-CM | POA: Diagnosis not present

## 2023-11-10 DIAGNOSIS — I340 Nonrheumatic mitral (valve) insufficiency: Secondary | ICD-10-CM | POA: Diagnosis not present

## 2023-11-10 DIAGNOSIS — I2510 Atherosclerotic heart disease of native coronary artery without angina pectoris: Secondary | ICD-10-CM | POA: Diagnosis not present

## 2023-11-10 DIAGNOSIS — Z992 Dependence on renal dialysis: Secondary | ICD-10-CM | POA: Diagnosis not present

## 2023-11-10 DIAGNOSIS — Z79899 Other long term (current) drug therapy: Secondary | ICD-10-CM | POA: Diagnosis not present

## 2023-11-10 DIAGNOSIS — Z01818 Encounter for other preprocedural examination: Secondary | ICD-10-CM | POA: Diagnosis not present

## 2023-11-10 DIAGNOSIS — Z794 Long term (current) use of insulin: Secondary | ICD-10-CM | POA: Diagnosis not present

## 2023-11-10 DIAGNOSIS — N186 End stage renal disease: Secondary | ICD-10-CM | POA: Diagnosis not present

## 2023-11-10 DIAGNOSIS — E7800 Pure hypercholesterolemia, unspecified: Secondary | ICD-10-CM | POA: Diagnosis not present

## 2023-11-10 DIAGNOSIS — Z79890 Hormone replacement therapy (postmenopausal): Secondary | ICD-10-CM | POA: Diagnosis not present

## 2023-11-10 DIAGNOSIS — Z01810 Encounter for preprocedural cardiovascular examination: Secondary | ICD-10-CM | POA: Diagnosis not present

## 2023-11-10 DIAGNOSIS — E039 Hypothyroidism, unspecified: Secondary | ICD-10-CM | POA: Diagnosis not present

## 2023-11-10 DIAGNOSIS — Z9049 Acquired absence of other specified parts of digestive tract: Secondary | ICD-10-CM | POA: Diagnosis not present

## 2023-11-10 DIAGNOSIS — T82868A Thrombosis of vascular prosthetic devices, implants and grafts, initial encounter: Secondary | ICD-10-CM | POA: Diagnosis not present

## 2023-11-10 DIAGNOSIS — I71019 Dissection of thoracic aorta, unspecified: Secondary | ICD-10-CM | POA: Diagnosis not present

## 2023-11-10 DIAGNOSIS — I4891 Unspecified atrial fibrillation: Secondary | ICD-10-CM | POA: Diagnosis not present

## 2023-11-10 DIAGNOSIS — I252 Old myocardial infarction: Secondary | ICD-10-CM | POA: Diagnosis not present

## 2023-11-10 DIAGNOSIS — T82856A Stenosis of peripheral vascular stent, initial encounter: Secondary | ICD-10-CM | POA: Diagnosis not present

## 2023-11-10 DIAGNOSIS — M810 Age-related osteoporosis without current pathological fracture: Secondary | ICD-10-CM | POA: Diagnosis not present

## 2023-11-10 DIAGNOSIS — I4581 Long QT syndrome: Secondary | ICD-10-CM | POA: Diagnosis not present

## 2023-11-10 DIAGNOSIS — I447 Left bundle-branch block, unspecified: Secondary | ICD-10-CM | POA: Diagnosis not present

## 2023-11-10 DIAGNOSIS — K219 Gastro-esophageal reflux disease without esophagitis: Secondary | ICD-10-CM | POA: Diagnosis not present

## 2023-11-10 DIAGNOSIS — I7121 Aneurysm of the ascending aorta, without rupture: Secondary | ICD-10-CM | POA: Diagnosis not present

## 2023-11-10 DIAGNOSIS — Z85828 Personal history of other malignant neoplasm of skin: Secondary | ICD-10-CM | POA: Diagnosis not present

## 2023-11-10 DIAGNOSIS — D631 Anemia in chronic kidney disease: Secondary | ICD-10-CM | POA: Diagnosis not present

## 2023-11-10 DIAGNOSIS — I5042 Chronic combined systolic (congestive) and diastolic (congestive) heart failure: Secondary | ICD-10-CM | POA: Diagnosis not present

## 2023-11-10 DIAGNOSIS — I132 Hypertensive heart and chronic kidney disease with heart failure and with stage 5 chronic kidney disease, or end stage renal disease: Secondary | ICD-10-CM | POA: Diagnosis not present

## 2023-11-10 DIAGNOSIS — N2581 Secondary hyperparathyroidism of renal origin: Secondary | ICD-10-CM | POA: Diagnosis not present

## 2023-11-10 DIAGNOSIS — E1122 Type 2 diabetes mellitus with diabetic chronic kidney disease: Secondary | ICD-10-CM | POA: Diagnosis not present

## 2023-11-10 DIAGNOSIS — I429 Cardiomyopathy, unspecified: Secondary | ICD-10-CM | POA: Diagnosis not present

## 2023-11-10 NOTE — H&P (Signed)
 History and Physical Update  I have examined the patient and reviewed the history and physical, which was completed within 30 days. The patient is a suitable candidate for surgery and there are no changes in the history and physical .     The risks, benefits, complications, treatment options, non-operative alternatives, expected recovery and outcomes were discussed with patient .  The possibilities of reaction to medication, pulmonary aspiration, injury to surrounding structures, bleeding, recurrent infection, the need for additional procedures, failure to diagnose a condition, and creating a complication requiring transfusion or operation were discussed with  patient.  They concurred with the proposed plan, giving informed consent.  The site of surgery was properly noted/marked if necessary per policy.

## 2023-11-10 NOTE — Procedures (Signed)
 Operative Note   Date: 11/10/2023  Location: [facility] MAIN OR  Name: Matthew [name], DOB: [birth year], MRN: [id number]  Diagnosis Preop : Clotted left AV graft     Post op: Same Procedures Declotting of left AV graft with angioplasty 63094, central segment angioplasty 63092     Surgeons     * Laine Alm RAMAN - Primary  Procedure Summary Anesthesia: General  ASA: ASA status not filed in the log. Estimated Blood Loss: Minimal IV Fluid Intake       Intake                     Drains: * None in log *   Staff/Assistants:  Circulator: Carletha Perkins, RN; Jeneane Search, RN Scrub Person: Loni Moats, RT; Billy Remak  Indications: Clotted left AV graft  Specimen: No specimens collected    Findings: Culprit lesion was probably the graft itself in the cannulation zone due to deterioration of the graft.  This was treated up to 6 mm diameter and is smooth and patent now.  The proximal stent edge at the subclavian vein was treated with a 9 mm balloon as well as in-stent stenosis in the axillary segment.   Procedure Details: The access was punctured on the arterial aspect close to the arterial anastomosis antegrade. A wire was directed under fluoroscopy and an 8 French sheath was placed. The wire was then directed centrally with catheter guidance and a pullback venogram showed patency centrally and to the level of the subclavian vein. Heparin  was given. A 8 mm charger balloon was then utilized for balloon angioplasty throughout the outflow lesion and clots in the access.  Careful antegrade contrast injection demonstrated patency. Care was taken not to have any retrograde injection. The access was then punctured retrograde on the venous limb and a 7 French sheath was placed. A wire was directed into the proximal brachial artery with catheter guidance, and antegrade injection in the artery showed good arterial anatomy to the fistula and to the hand. The catheter was extended into  the proximal arterial system to obtain appropriate imaging of the feeding artery to the access. This also confirmed the lack of clot in the arterial vasculature beyond the access arterial anastomosis A Fogarty catheter was then utilized to pull the arterial plug with 3 passages made. Follow-up catheter injection from the artery antegrade shows patency of the arterial anastomosis and flow to the hand. There is reestablishment of flow in the access. Residual clot was treated with balloon angioplasty.  The graft itself in the cannulation zone was likely the area of most stenosis and occlusion.  The cleaner device was used at the proximal stent edge of the subclavian edge where clot harbored and a 9 mm balloon was used in this segment as well.  Some in-stent stenosis where there were overlapping stents was treated with a 9 mm balloon.  Wires and catheters were removed. The sheaths were removed. Figure-of-eight sutures were placed. Sterile dressings were applied. The patient was returned to the recovery room in good condition.        Disclaimer: Portions of this note have been made using Conservation officer, historic buildings.  Variances in spelling, vocabulary and language are possible and unintentional.  A reasonable attempt at proofreading has been made.  Nonetheless unintentional transcriptional errors  may occur and may not be caught and/or corrected.  Please notify the dino if any discrepancies are noted and/or if the meaning of any statement is unclear.  Complications:  none Disposition: DAC Condition: stable  Alm Chroman MD Odie Register D[facility] [phone]

## 2023-11-10 NOTE — Anesthesia Postprocedure Evaluation (Signed)
 Patient: Matthew [name]  Procedure Summary     Date: 11/10/23 Room / Location: [facility] OR 04 / [facility] Main OR   Anesthesia Start: 1635 Anesthesia Stop: 1725   Procedure: DECLOT OF LEFT ARTERIOVENOUS GRAFT WITH ANGIOPLASTY AND CENTRAL SEGMENT ANGIOPLASTY (Left: Arm Upper) Diagnosis:      ESRD (end stage renal disease) on dialysis (HCC)     (ESRD (end stage renal disease) on dialysis (HCC) [N18.6, Z99.2])   Surgeons: Laine Alm RAMAN, MD Responsible Provider: Crawford Sharper, MD   Anesthesia Type: MAC ASA Status: 4       Anesthesia Type: MAC  Vitals Value Taken Time  BP 113/71 11/10/23 17:55  Temp 36.4 C (97.5 F) 11/10/23 17:55  Pulse 70 11/10/23 18:53  Resp 18 11/10/23 17:55  SpO2 99 % 11/10/23 17:55    Anesthesia Post Evaluation  Pain score: 0 Pain management: adequate Patient location during evaluation: PACU Patient participation: complete - patient participated Level of consciousness: awake and alert Cardiovascular status: acceptable Respiratory status: acceptable Hydration status: acceptable Airway patency: patent Multimodal analgesia pain management approach Two or more strategies used to mitigate risk of obstructive sleep apnea Gastrointestinal: Nausea and vomitting controlled     No notable events documented.

## 2023-11-10 NOTE — Patient Instructions (Signed)
 Patient Education Table of Contents Dialysis Vascular Access Problems: What to Know Monitored Anesthesia Care, Care After  To view videos and all your education online visit, [email or web address] or scan this QR code with your smartphone. Access to this content will expire in one year. Dialysis Vascular Access Problems: What to Know    A dialysis vascular access is the place on your body where your blood is taken out and then put back in during hemodialysis. Hemodialysis is a process where a filter, called a dialyzer, is used to clean your blood when your kidneys can't do it. There are three types of vascular access: An arteriovenous fistula (AVF). This is made by connecting an artery and a vein to make a bigger blood vessel called a fistula. An arteriovenous graft (AVG). This connects an artery and a vein using a soft tube called a graft. A central venous catheter (CVC). A CVC is a long, thin tube that's put in a big vein in your neck, chest, or groin. Any kind of access can have problems. Some problems depend on the type of access. And some types of access tend to have more problems than others. You'll learn what your access should look and feel like. Let your health care provider know about any changes. Getting treatment right away can help keep problems from getting worse and could save your access. What are the causes? Here are some of the common problems, what causes them, and how they might be treated: Blockage Blood clots Clots are the most common cause of problems in all types of vascular access. When a blood clot forms inside the access, it can block all or some blood flow. Blood clots can form if blood isn't flowing through the access like it should. This is more likely if you have low blood pressure, heart problems, or clotting issues. Clots can be treated with procedures that break up or remove the clot. Clot-busting medicines may also be used. If clots keep  forming, you may need to have a second access site made as a backup option to use. Kinks A kink in the graft or catheter can block it. Surgery is needed to fix the kink or replace the access. Stenosis Stenosis is when a fistula or graft gets narrow. Over time it can become so narrow that it's fully blocked. This narrowing can be treated by using a balloon to stretch it open. A small, stiff tube called a stent can also be put in to keep the fistula or graft open. Infection Any type of access can get infected, but it's more common in CVCs. This is because the skin isn't closed where the CVC comes out. Most infections can be treated with antibiotics. Dialysis access steal syndrome (DASS) DASS happens when the limb with the fistula or graft isn't getting a good blood supply. This is because the access steals the blood and sends it back to the body before it goes through the whole limb. DASS may be closely watched. Sometimes it gets better on its own. But some people need surgery to get better blood flow to the limb with the access. Aneurysm An aneurysm is a sudden bulge in a fistula or graft access. It can be caused by a tear or weakness in the blood vessels of the access. An aneurysm may be treated with surgery to fix the tear or take out the weak part. Sometimes treatment isn't needed. It depends on where and how big the aneurysm is, and what symptoms it's causing.  What are the signs or symptoms? Access flow issues and clots are the leading causes of vascular access loss. You'll be taught how to check your access to be sure it's OK. Some common symptoms of vascular access problems that you can watch for are: A change in the vibration or buzz of your fistula or graft. This is called a thrill. Feeling no thrill in your fistula or graft. New or unusual swelling or pain around your access. Pale skin or the skin feeling cool or numb in the area with the access. Signs of infection, such  as: Pain, swelling, redness, or red streaks near your access. Blood or pus coming from the access. Other problems may be noticed during dialysis, such as: The health care team having trouble using your access. Slow flow of blood through your access. Dialysis will not work well if this happens. Bleeding that can't be easily stopped when the needles are removed after treatment. How is this diagnosed? Access problems may be diagnosed with: An X-ray that uses dye, or contrast. Ultrasound of the fistula or graft. Blood tests. Contact a health care provider if: You notice any changes in how your access looks or feels. Swelling around your access gets worse. You have new pain at your access. You have pus or other fluid coming from your access. You have chills or a fever. You have any of these problems on the hand of your arm with the access: Blue fingers or numbness. A pale skin color. Sores at the tips of the fingers. You have redness or red streaks on the skin around, above, or below your access. If you can't reach your provider, go to an urgent care or emergency room. Get help right away if: You have bleeding at your access that can't be easily stopped. Your access is suddenly hot, swollen, red, and very painful. You can suddenly see parts of the access that were below the skin. These symptoms may be an emergency. Call 911 right away. Do not wait to see if the symptoms will go away. Do not drive yourself to the hospital. This information is not intended to replace advice given to you by your health care provider. Make sure you discuss any questions you have with your health care provider. Document Released: 2006-04-12 Document Updated: 2022-10-15 Document Reviewed: 2022-10-15 Elsevier Patient Education  2025 [employer] Monitored Anesthesia Care, Care After The following information offers guidance on how to care for yourself after your procedure. Your health care provider may also  give you more specific instructions. If you have problems or questions, contact your health care provider. What can I expect after the procedure? After the procedure, it is common to have: Tiredness. Little or no memory about what happened during or after the procedure. Impaired judgment when it comes to making decisions. Nausea or vomiting. Some trouble with balance. Follow these instructions at home: For the time period you were told by your health care provider:  Rest. Do not participate in activities where you could fall or become injured. Do not drive or use machinery. Do not drink alcohol. Do not take sleeping pills or medicines that cause drowsiness. Do not make important decisions or sign legal documents. Do not take care of children on your own. Medicines Take over-the-counter and prescription medicines only as told by your health care provider. If you were prescribed antibiotics, take them as told by your health care provider. Do not stop using the antibiotic even if you start to feel better. Eating  and drinking Follow instructions from your health care provider about what you may eat and drink. Drink enough fluid to keep your urine pale yellow. If you vomit: Drink clear fluids slowly and in small amounts as you are able. Clear fluids include water, ice chips, low-calorie sports drinks, and fruit juice that has water added to it (diluted fruit juice). Eat light and bland foods in small amounts as you are able. These foods include bananas, applesauce, rice, lean meats, toast, and crackers. General instructions  Have a responsible adult stay with you for the time you are told. It is important to have someone help care for you until you are awake and alert. If you have sleep apnea, surgery and some medicines can increase your risk for breathing problems. Follow instructions from your health care provider about wearing your sleep device: When you are sleeping. This includes  during daytime naps. While taking prescription pain medicines, sleeping medicines, or medicines that make you drowsy. Do not use any products that contain nicotine or tobacco. These products include cigarettes, chewing tobacco, and vaping devices, such as e-cigarettes. If you need help quitting, ask your health care provider. Contact a health care provider if: You feel nauseous or vomit every time you eat or drink. You feel light-headed. You are still sleepy or having trouble with balance after 24 hours. You get a rash. You have a fever. You have redness or swelling around the IV site. Get help right away if: You have trouble breathing. You have new confusion after you get home. These symptoms may be an emergency. Get help right away. Call 911. Do not wait to see if the symptoms will go away. Do not drive yourself to the hospital. This information is not intended to replace advice given to you by your health care provider. Make sure you discuss any questions you have with your health care provider. Document Released: 2015-08-31 Document Updated: 2021-10-05 Document Reviewed: 2021-10-05 Elsevier Patient Education  2025 [employer].

## 2023-11-11 DIAGNOSIS — Z992 Dependence on renal dialysis: Secondary | ICD-10-CM | POA: Diagnosis not present

## 2023-11-11 DIAGNOSIS — N186 End stage renal disease: Secondary | ICD-10-CM | POA: Diagnosis not present

## 2023-11-14 DIAGNOSIS — Z992 Dependence on renal dialysis: Secondary | ICD-10-CM | POA: Diagnosis not present

## 2023-11-14 DIAGNOSIS — D631 Anemia in chronic kidney disease: Secondary | ICD-10-CM | POA: Diagnosis not present

## 2023-11-14 DIAGNOSIS — N186 End stage renal disease: Secondary | ICD-10-CM | POA: Diagnosis not present

## 2023-11-14 DIAGNOSIS — N250 Renal osteodystrophy: Secondary | ICD-10-CM | POA: Diagnosis not present

## 2023-11-14 DIAGNOSIS — D509 Iron deficiency anemia, unspecified: Secondary | ICD-10-CM | POA: Diagnosis not present

## 2023-11-17 ENCOUNTER — Other Ambulatory Visit: Payer: Self-pay

## 2023-11-17 DIAGNOSIS — Z96641 Presence of right artificial hip joint: Secondary | ICD-10-CM | POA: Diagnosis not present

## 2023-11-17 DIAGNOSIS — T82858A Stenosis of vascular prosthetic devices, implants and grafts, initial encounter: Secondary | ICD-10-CM | POA: Diagnosis not present

## 2023-11-17 DIAGNOSIS — I480 Paroxysmal atrial fibrillation: Secondary | ICD-10-CM

## 2023-11-17 DIAGNOSIS — I4891 Unspecified atrial fibrillation: Secondary | ICD-10-CM | POA: Diagnosis not present

## 2023-11-17 DIAGNOSIS — T82868A Thrombosis of vascular prosthetic devices, implants and grafts, initial encounter: Secondary | ICD-10-CM | POA: Diagnosis not present

## 2023-11-17 DIAGNOSIS — I25810 Atherosclerosis of coronary artery bypass graft(s) without angina pectoris: Secondary | ICD-10-CM

## 2023-11-17 DIAGNOSIS — I871 Compression of vein: Secondary | ICD-10-CM | POA: Diagnosis not present

## 2023-11-17 DIAGNOSIS — N186 End stage renal disease: Secondary | ICD-10-CM | POA: Diagnosis not present

## 2023-11-17 DIAGNOSIS — I429 Cardiomyopathy, unspecified: Secondary | ICD-10-CM

## 2023-11-17 DIAGNOSIS — E1122 Type 2 diabetes mellitus with diabetic chronic kidney disease: Secondary | ICD-10-CM | POA: Diagnosis not present

## 2023-11-17 DIAGNOSIS — Z951 Presence of aortocoronary bypass graft: Secondary | ICD-10-CM | POA: Diagnosis not present

## 2023-11-17 DIAGNOSIS — Z992 Dependence on renal dialysis: Secondary | ICD-10-CM | POA: Diagnosis not present

## 2023-11-17 DIAGNOSIS — E782 Mixed hyperlipidemia: Secondary | ICD-10-CM

## 2023-11-17 DIAGNOSIS — I5022 Chronic systolic (congestive) heart failure: Secondary | ICD-10-CM

## 2023-11-17 MED ORDER — FUROSEMIDE 20 MG PO TABS: 20.0000 mg | ORAL_TABLET | Freq: Every day | ORAL | 11 refills | Status: DC

## 2023-11-18 DIAGNOSIS — D631 Anemia in chronic kidney disease: Secondary | ICD-10-CM | POA: Diagnosis not present

## 2023-11-18 DIAGNOSIS — Z992 Dependence on renal dialysis: Secondary | ICD-10-CM | POA: Diagnosis not present

## 2023-11-18 DIAGNOSIS — N250 Renal osteodystrophy: Secondary | ICD-10-CM | POA: Diagnosis not present

## 2023-11-18 DIAGNOSIS — D509 Iron deficiency anemia, unspecified: Secondary | ICD-10-CM | POA: Diagnosis not present

## 2023-11-18 DIAGNOSIS — N186 End stage renal disease: Secondary | ICD-10-CM | POA: Diagnosis not present

## 2023-11-21 DIAGNOSIS — Z992 Dependence on renal dialysis: Secondary | ICD-10-CM | POA: Diagnosis not present

## 2023-11-21 DIAGNOSIS — N250 Renal osteodystrophy: Secondary | ICD-10-CM | POA: Diagnosis not present

## 2023-11-21 DIAGNOSIS — N186 End stage renal disease: Secondary | ICD-10-CM | POA: Diagnosis not present

## 2023-11-21 DIAGNOSIS — D631 Anemia in chronic kidney disease: Secondary | ICD-10-CM | POA: Diagnosis not present

## 2023-11-21 DIAGNOSIS — D509 Iron deficiency anemia, unspecified: Secondary | ICD-10-CM | POA: Diagnosis not present

## 2023-11-23 DIAGNOSIS — D509 Iron deficiency anemia, unspecified: Secondary | ICD-10-CM | POA: Diagnosis not present

## 2023-11-23 DIAGNOSIS — D631 Anemia in chronic kidney disease: Secondary | ICD-10-CM | POA: Diagnosis not present

## 2023-11-23 DIAGNOSIS — Z992 Dependence on renal dialysis: Secondary | ICD-10-CM | POA: Diagnosis not present

## 2023-11-23 DIAGNOSIS — N250 Renal osteodystrophy: Secondary | ICD-10-CM | POA: Diagnosis not present

## 2023-11-23 DIAGNOSIS — N186 End stage renal disease: Secondary | ICD-10-CM | POA: Diagnosis not present

## 2023-11-25 ENCOUNTER — Other Ambulatory Visit: Payer: Self-pay

## 2023-11-25 ENCOUNTER — Emergency Department
Admission: EM | Admit: 2023-11-25 | Discharge: 2023-11-26 | Disposition: A | Discharge status: Home / Self Care | Attending: Emergency Medicine | Admitting: Emergency Medicine

## 2023-11-25 DIAGNOSIS — T82818A Embolism of vascular prosthetic devices, implants and grafts, initial encounter: Secondary | ICD-10-CM | POA: Diagnosis not present

## 2023-11-25 DIAGNOSIS — R944 Abnormal results of kidney function studies: Secondary | ICD-10-CM | POA: Insufficient documentation

## 2023-11-25 DIAGNOSIS — N186 End stage renal disease: Secondary | ICD-10-CM

## 2023-11-25 DIAGNOSIS — Z992 Dependence on renal dialysis: Secondary | ICD-10-CM | POA: Diagnosis not present

## 2023-11-25 DIAGNOSIS — T82898A Other specified complication of vascular prosthetic devices, implants and grafts, initial encounter: Principal | ICD-10-CM

## 2023-11-25 DIAGNOSIS — I770 Arteriovenous fistula, acquired: Secondary | ICD-10-CM | POA: Insufficient documentation

## 2023-11-25 DIAGNOSIS — T82868A Thrombosis of vascular prosthetic devices, implants and grafts, initial encounter: Secondary | ICD-10-CM | POA: Insufficient documentation

## 2023-11-25 LAB — CBC WITH DIFFERENTIAL/PLATELET
Abs Immature Granulocytes: 0.02 K/uL (ref 0.00–0.07)
Basophils Absolute: 0.1 K/uL (ref 0.0–0.1)
Basophils Relative: 1 %
Eosinophils Absolute: 0.5 K/uL (ref 0.0–0.5)
Eosinophils Relative: 9 %
HCT: 35.7 % — ABNORMAL LOW (ref 39.0–52.0)
Hemoglobin: 11.8 g/dL — ABNORMAL LOW (ref 13.0–17.0)
Immature Granulocytes: 0 %
Lymphocytes Relative: 11 %
Lymphs Abs: 0.6 K/uL — ABNORMAL LOW (ref 0.7–4.0)
MCH: 32.3 pg (ref 26.0–34.0)
MCHC: 33.1 g/dL (ref 30.0–36.0)
MCV: 97.8 fL (ref 80.0–100.0)
Monocytes Absolute: 0.6 K/uL (ref 0.1–1.0)
Monocytes Relative: 11 %
Neutro Abs: 3.7 K/uL (ref 1.7–7.7)
Neutrophils Relative %: 68 %
Platelets: 102 K/uL — ABNORMAL LOW (ref 150–400)
RBC: 3.65 MIL/uL — ABNORMAL LOW (ref 4.22–5.81)
RDW: 15.1 % (ref 11.5–15.5)
WBC: 5.5 K/uL (ref 4.0–10.5)
nRBC: 0 % (ref 0.0–0.2)

## 2023-11-25 LAB — COMPREHENSIVE METABOLIC PANEL WITH GFR
ALT: 24 U/L (ref 0–44)
AST: 31 U/L (ref 15–41)
Albumin: 3.4 g/dL — ABNORMAL LOW (ref 3.5–5.0)
Alkaline Phosphatase: 227 U/L — ABNORMAL HIGH (ref 38–126)
Anion gap: 11 (ref 5–15)
BUN: 70 mg/dL — ABNORMAL HIGH (ref 8–23)
CO2: 24 mmol/L (ref 22–32)
Calcium: 9.7 mg/dL (ref 8.9–10.3)
Chloride: 102 mmol/L (ref 98–111)
Creatinine, Ser: 3.33 mg/dL — ABNORMAL HIGH (ref 0.61–1.24)
GFR, Estimated: 17 mL/min — ABNORMAL LOW (ref >60)
Glucose, Bld: 117 mg/dL — ABNORMAL HIGH (ref 70–99)
Potassium: 4.8 mmol/L (ref 3.5–5.1)
Sodium: 137 mmol/L (ref 135–145)
Total Bilirubin: 0.9 mg/dL (ref 0.0–1.2)
Total Protein: 7.1 g/dL (ref 6.5–8.1)

## 2023-11-25 MED ORDER — CHLORHEXIDINE GLUCONATE CLOTH 2 % EX PADS
6.0000 | MEDICATED_PAD | Freq: Every day | CUTANEOUS | Status: DC
  Filled 2023-11-25: qty 6

## 2023-11-25 MED ORDER — LIDOCAINE HCL (PF) 1 % IJ SOLN: 5.0000 mL | INTRAMUSCULAR | Status: DC | PRN

## 2023-11-25 MED ORDER — LIDOCAINE-PRILOCAINE 2.5-2.5 % EX CREA: 1.0000 | TOPICAL_CREAM | CUTANEOUS | Status: DC | PRN

## 2023-11-25 MED ORDER — MIDODRINE HCL 5 MG PO TABS
5.0000 mg | ORAL_TABLET | Freq: Once | ORAL | Status: AC
  Administered 2023-11-25: 5 mg via ORAL
  Filled 2023-11-25: qty 1

## 2023-11-25 MED ORDER — ANTICOAGULANT SODIUM CITRATE 4% (200MG/5ML) IV SOLN: 5.0000 mL | Status: DC | PRN

## 2023-11-25 MED ORDER — HEPARIN SODIUM (PORCINE) 1000 UNIT/ML DIALYSIS: 1000.0000 [IU] | INTRAMUSCULAR | Status: DC | PRN

## 2023-11-25 MED ORDER — NEPRO/CARBSTEADY PO LIQD: 237.0000 mL | ORAL | Status: DC | PRN

## 2023-11-25 MED ORDER — PENTAFLUOROPROP-TETRAFLUOROETH EX AERO: 1.0000 | INHALATION_SPRAY | CUTANEOUS | Status: DC | PRN

## 2023-11-25 MED ORDER — FENTANYL CITRATE PF 50 MCG/ML IJ SOSY
25.0000 ug | PREFILLED_SYRINGE | Freq: Once | INTRAMUSCULAR | Status: AC
  Administered 2023-11-25: 25 ug via INTRAVENOUS
  Filled 2023-11-25: qty 1

## 2023-11-25 MED ORDER — ALTEPLASE 2 MG IJ SOLR: 2.0000 mg | Freq: Once | INTRAMUSCULAR | Status: DC | PRN

## 2023-11-25 NOTE — ED Provider Notes (Signed)
 [facility] Provider Note    Event Date/Time   First MD Initiated Contact with Patient 11/25/23 1341     (approximate)   History   Vascular Access Problem   HPI  UNNAMED ZEIEN is a 89 y.o. male past medical history significant for ESRD on HD MWF with a left AV fistula, who presents to the emergency department with vascular access problems.  Patient went to dialysis today and was unable to access his left AV fistula.  Patient has recently had multiple problems with his left AV fistula and has had to have it declotted.  Last procedure was done on 6/19.  Last went to dialysis on Wednesday.  No other further complaints at this time.  Denies chest pain or shortness of breath.  Patient is here with his daughter who is at bedside.  Does take home midodrine  and last took his midodrine  earlier today.     Physical Exam   Triage Vital Signs: ED Triage Vitals [11/25/23 1254]  Encounter Vitals Group     BP 106/71     Girls Systolic BP Percentile      Girls Diastolic BP Percentile      Boys Systolic BP Percentile      Boys Diastolic BP Percentile      Pulse Rate 85     Resp 18     Temp 98.1 F (36.7 C)     Temp Source Oral     SpO2 96 %     Weight 196 lb (88.9 kg)     Height 6' (1.829 m)     Head Circumference      Peak Flow      Pain Score 0     Pain Loc      Pain Education      Exclude from Growth Chart     Most recent vital signs: Vitals:   11/25/23 1500 11/25/23 1530  BP: 108/68 105/62  Pulse: (!) 45 60  Resp: 20 (!) 22  Temp:    SpO2: 90% 91%    Physical Exam Constitutional:      Appearance: He is well-developed.  HENT:     Head: Atraumatic.  Eyes:     Conjunctiva/sclera: Conjunctivae normal.  Cardiovascular:     Rate and Rhythm: Regular rhythm.  Pulmonary:     Effort: No respiratory distress.  Abdominal:     Tenderness: There is no abdominal tenderness.  Musculoskeletal:     Cervical back: Normal range of motion.  Skin:     General: Skin is warm.     Comments: Left AV fistula with no palpable thrill  Neurological:     Mental Status: He is alert. Mental status is at baseline.  Psychiatric:        Mood and Affect: Mood normal.     IMPRESSION / MDM / ASSESSMENT AND PLAN / ED COURSE  I reviewed the triage vital signs and the nursing notes.  Differential diagnosis including AV fistula problem, need of emergent dialysis, electrolyte abnormality, volume overload  On arrival patient had soft blood pressure.  Chronically soft blood pressure and takes home midodrine .  Reordered home midodrine  5 mg  LABS (all labs ordered are listed, but only abnormal results are displayed) Labs interpreted as -    Labs Reviewed  CBC WITH DIFFERENTIAL/PLATELET - Abnormal; Notable for the following components:      Result Value   RBC 3.65 (*)    Hemoglobin 11.8 (*)    HCT  35.7 (*)    Platelets 102 (*)    Lymphs Abs 0.6 (*)    All other components within normal limits  COMPREHENSIVE METABOLIC PANEL WITH GFR - Abnormal; Notable for the following components:   Glucose, Bld 117 (*)    BUN 70 (*)    Creatinine, Ser 3.33 (*)    Albumin  3.4 (*)    Alkaline Phosphatase 227 (*)    GFR, Estimated 17 (*)    All other components within normal limits     MDM  Patient with no leukocytosis.  Anemia but hemoglobin is stable.  Worsening thrombocytopenia but does not appear to be present in May of this year.  No signs or symptoms of a bleed.  Potassium within normal limits at 4.8.  Does have an elevated BUN of 70.  Creatinine 3.3.  Discussed the patient's case with vascular surgery, Dr. Serene, who stated that they would not be able to do a AV declotting until Monday.  Recommended reaching out to nephrology to discuss whether he needs to be admitted to the hospital for a temporary catheter or if he could wait until Monday for outpatient follow-up.  Will reach out to nephrology, discussed with Nephrology, Dr. Douglas, recommended  admission for a temporary Vas-Cath.  Discussed with vascular surgery who will come into the ER to place the Vas-Cath.  Consulted hospitalist for admission.     PROCEDURES:  Critical Care performed: No  Procedures  Patient's presentation is most consistent with acute presentation with potential threat to life or bodily function.   MEDICATIONS ORDERED IN ED: Medications  midodrine  (PROAMATINE ) tablet 5 mg (5 mg Oral Given 11/25/23 1434)    FINAL CLINICAL IMPRESSION(S) / ED DIAGNOSES   Final diagnoses:  AV fistula occlusion, initial encounter (HCC)     Rx / DC Orders   ED Discharge Orders     None        Note:  This document was prepared using Dragon voice recognition software and may include unintentional dictation errors.   Suzanne Kirsch, MD 11/25/23 [phone]

## 2023-11-25 NOTE — Consult Note (Signed)
 [facility] [place]  Patient name: Matthew [name] MRN: [id number] DOB: 1930-08-01 Sex: male   REQUESTING PROVIDER:   ER   REASON FOR CONSULT:    Clotted dialysis graft  HISTORY OF PRESENT ILLNESS:   Matthew [name] is a 89 y.o. male, who has end-stage renal disease on dialysis Monday Wednesday Friday.  He dialyzes through a left upper arm graft.  He has had 3 de-clots within the past month or 2, 2 of which were done at different institutions.  He went to dialysis earlier today and his graft has reoccluded.  He was sent to the ER.  PAST MEDICAL HISTORY    Past Medical History:  Diagnosis Date   Anginal pain (HCC)    Arthritis    Ascending aortic aneurysm (HCC) 07/18/2015   a.) TTE 07/18/2015: severe dilation of ascending aorta measuring 7.0 cm. b.) TTE 01/10/2018: aortic root 3.8 cm; ascending aorta 6.8 cm; refused surgical intervention/repair.   Atrial fibrillation (HCC)    a.) CHA2DS2-VASc = 6 (age x 2, HFrEF, HTN, previous MI, T2DM). b.) rate/rhythm maintained on amiodarone  + metoprolol  succinate; chronic antiplatelet therapy using clopidogrel    Bradycardia    Coronary artery disease    ESRD (end stage renal disease) (HCC)    GERD (gastroesophageal reflux disease)    HFrEF (heart failure with reduced ejection fraction) (HCC)    a.) TTE 12/03/2013: mod dec LV function; EF 35-40%; severe inferolateral and inferior HK; LA dilated; G3DD; PASP 52 mmHg. b.) TTE 07/18/2015: mild LV dysfunction with mild LVH; EF 45%; mild BAE. c.) TTE 01/10/2018: mod LV dysfunction; EF 45%; diffuse HK   HLD (hyperlipidemia)    Hypertension    IDA (iron deficiency anemia)    Long term current use of antithrombotics/antiplatelets    a.) clopidogrel    Myocardial infarction (HCC) 2000   Pneumonia    S/P CABG x 4 2001   a.) LIMA-LAD, SVG-D1, SVG-OM1, SVG-OM2, SVG-PDA   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   left frontal scalp, EDC    Squamous cell carcinoma of skin 05/05/2020   left temporal scalp, in situ, EDC 05/13/20   T2DM (type 2 diabetes mellitus) (HCC)    Valvular insufficiency 12/03/2013   a.) TTE 12/03/2013: EF 35-40%; mild AR/MR, b.) TTE 07/18/2015: EF 45%; mild AR/PR, mod TR, severe MR. c.) TTE 01/10/2018: mod AR/TR     FAMILY HISTORY   Family History  Problem Relation Age of Onset   Heart attack Father 54       died first MI at age 60   Heart failure Mother     SOCIAL HISTORY:   Social History   Socioeconomic History   Marital status: Widowed    Spouse name: Not on file   Number of children: Not on file   Years of education: Not on file   Highest education level: Not on file  Occupational History   Not on file  Tobacco Use   Smoking status: Former    Current packs/day: 0.00    Types: Pipe, Cigarettes    Quit date: 78    Years since quitting: 33.5   Smokeless tobacco: Never  Vaping Use   Vaping status: Never Used  Substance and Sexual Activity   Alcohol use: Not Currently    Alcohol/week: 1.0 standard drink of alcohol    Types: 1 Cans of beer per week    Comment: occasionally drinking only,once a month   Drug use: No   Sexual activity:  Never  Other Topics Concern   Not on file  Social History Narrative   Lives with daughter and son in law   Social Drivers of Health   Financial Resource Strain: Low Risk  (01/10/2018)   Overall Financial Resource Strain (CARDIA)    Difficulty of Paying Living Expenses: Not hard at all  Food Insecurity: No Food Insecurity (02/04/2023)   Hunger Vital Sign    Worried About Running Out of Food in the Last Year: Never true    Ran Out of Food in the Last Year: Never true  Transportation Needs: No Transportation Needs (02/04/2023)   PRAPARE - Administrator, Civil Service (Medical): No    Lack of Transportation (Non-Medical): No  Physical Activity: Inactive (01/10/2018)   Exercise Vital Sign    Days of Exercise per Week: 0 days     Minutes of Exercise per Session: 0 min  Stress: No Stress Concern Present (01/10/2018)   [employer] of Occupational Health - Occupational Stress Questionnaire    Feeling of Stress : Not at all  Social Connections: Socially Integrated (01/10/2018)   Social Connection and Isolation Panel    Frequency of Communication with Friends and Family: More than three times a week    Frequency of Social Gatherings with Friends and Family: More than three times a week    Attends Religious Services: More than 4 times per year    Active Member of [employer] or Organizations: Yes    Attends Banker Meetings: More than 4 times per year    Marital Status: Married  Catering manager Violence: Not At Risk (02/04/2023)   Humiliation, Afraid, Rape, and Kick questionnaire    Fear of Current or Ex-Partner: No    Emotionally Abused: No    Physically Abused: No    Sexually Abused: No    ALLERGIES:    Allergies  Allergen Reactions   Amoxicillin Diarrhea    CURRENT MEDICATIONS:    Current Facility-Administered Medications  Medication Dose Route Frequency Provider Last Rate Last Admin   [START ON 11/26/2023] Chlorhexidine  Gluconate Cloth 2 % PADS 6 each  6 each Topical Q0600 Kolluru, Sarath, MD       Current Outpatient Medications  Medication Sig Dispense Refill   midodrine  (PROAMATINE ) 10 MG tablet Take 10 mg by mouth 3 (three) times daily. Take 10 mg by mouth See administration instructions. Pt takes twice a day only on dialysis days     aspirin  EC 81 MG tablet Take 81 mg by mouth daily. Swallow whole.     calcium  carbonate (TUMS EX) 750 MG chewable tablet Chew 1 tablet by mouth 3 (three) times daily.     cetirizine (ZYRTEC) 10 MG tablet Take 10 mg by mouth daily.     cholecalciferol  (VITAMIN D ) 25 MCG (1000 UNIT) tablet Take 1,000 Units by mouth daily with breakfast.     clopidogrel  (PLAVIX ) 75 MG tablet TAKE 1 TABLET BY MOUTH DAILY 90 tablet 3   Continuous Glucose Receiver (FREESTYLE LIBRE  3 READER) DEVI 1 Device by Does not apply route daily. (Patient not taking: Reported on 10/04/2023) 1 each 0   Continuous Glucose Sensor (FREESTYLE LIBRE 3 SENSOR) MISC 1 patch by Does not apply route as directed. Place 1 sensor on the skin every 14 days. Use to check glucose continuously (Patient not taking: Reported on 10/04/2023) 2 each 11   ferrous sulfate  325 (65 FE) MG tablet Take 325 mg by mouth 2 (two) times daily with a  meal.      fluticasone  (FLONASE ) 50 MCG/ACT nasal spray Place 2 sprays into both nostrils daily as needed for allergies or rhinitis.     furosemide  (LASIX ) 20 MG tablet Take 1 tablet (20 mg total) by mouth daily. 30 tablet 11   glipiZIDE  2.5 MG TABS Take 1 tablet by mouth AC breakfast. 90 tablet 1   levocetirizine (XYZAL ) 5 MG tablet Take 10 mg by mouth every evening. (Patient not taking: Reported on 09/13/2023)     levothyroxine  (SYNTHROID ) 100 MCG tablet TAKE 1 TABLET BY MOUTH IN THE  MORNING 90 tablet 3   lidocaine -prilocaine  (EMLA ) cream Apply 1 Application topically as needed (before dialysis).     midodrine  (PROAMATINE ) 5 MG tablet Take 1 tablet (5 mg total) by mouth 3 (three) times daily with meals. (Patient taking differently: Take 5 mg by mouth 3 (three) times daily with meals. 5MG  1 HOUR BEFORE DIALYSIS)     multivitamin (RENA-VIT) TABS tablet Take 1 tablet by mouth daily.     nitroGLYCERIN  (NITROSTAT ) 0.4 MG SL tablet DISSOLVE 1 TABLET UNDER THE  TONGUE EVERY 5 MINUTES AS NEEDED FOR CHEST PAIN. MAX OF 3 TABLETS IN 15 MINUTES. CALL 911 IF PAIN  PERSISTS. 100 tablet 3   Omega-3 Fatty Acids (FISH OIL ) 1000 MG CAPS Take 4,000 mg by mouth daily with lunch.     polyethylene glycol (MIRALAX  / GLYCOLAX ) 17 g packet Take 17 g by mouth daily. 14 each 0   potassium chloride  SA (KLOR-CON  M) 20 MEQ tablet Take 20 mEq by mouth daily.     rosuvastatin  (CRESTOR ) 40 MG tablet Take 1 tablet (40 mg total) by mouth daily. 90 tablet 3   senna-docusate (SENOKOT-S) 8.6-50 MG tablet Take 2  tablets by mouth 2 (two) times daily. 30 tablet 0   traZODone  (DESYREL ) 50 MG tablet Take 50 mg by mouth at bedtime as needed for sleep (1/2 Tablet as needed).      REVIEW OF SYSTEMS:   [X]  denotes positive finding, [ ]  denotes negative finding Cardiac  Comments:  Chest pain or chest pressure:    Shortness of breath upon exertion:    Short of breath when lying flat:    Irregular heart rhythm:        Vascular    Pain in calf, thigh, or hip brought on by ambulation:    Pain in feet at night that wakes you up from your sleep:     Blood clot in your veins:    Leg swelling:         Pulmonary    Oxygen at home:    Productive cough:     Wheezing:         Neurologic    Sudden weakness in arms or legs:     Sudden numbness in arms or legs:     Sudden onset of difficulty speaking or slurred speech:    Temporary loss of vision in one eye:     Problems with dizziness:         Gastrointestinal    Blood in stool:      Vomited blood:         Genitourinary    Burning when urinating:     Blood in urine:        Psychiatric    Major depression:         Hematologic    Bleeding problems:    Problems with blood clotting too easily:  Skin    Rashes or ulcers:        Constitutional    Fever or chills:     PHYSICAL EXAM:   Vitals:   11/25/23 1430 11/25/23 1500 11/25/23 1530 11/25/23 1730  BP: (!) 111/50 108/68 105/62 105/68  Pulse: 69 (!) 45 60 96  Resp: 16 20 (!) 22 14  Temp:      TempSrc:      SpO2: 96% 90% 91% 97%  Weight:      Height:        GENERAL: The patient is a well-nourished male, in no acute distress. The vital signs are documented above. CARDIAC: There is a regular rate and rhythm.  VASCULAR: Occluded left upper arm graft PULMONARY: Nonlabored respirations ABDOMEN: Soft and non-tender with normal pitched bowel sounds.  MUSCULOSKELETAL: There are no major deformities or cyanosis. NEUROLOGIC: No focal weakness or paresthesias are detected. SKIN: There  are no ulcers or rashes noted. PSYCHIATRIC: The patient has a normal affect.  STUDIES:   None  ASSESSMENT and PLAN   End-stage renal disease: I discussed with the patient and his daughter that we will place a temporary catheter for him to receive dialysis today and I will schedule him for a tunneled catheter as an outpatient on Monday.  Since his graft has clotted 3 times in the last 6-8 weeks, I think the best course of action is to explore new access.  He has never had access in the right arm.  This will be addressed when he comes in for his catheter.   Malvina Serene CLORE, MD, FACS [facility] [phone] Pager [phone]

## 2023-11-25 NOTE — Consult Note (Signed)
 Medical Consultation  Matthew [name] FMW:[id number] DOB: 1930/06/27 DOA: 11/25/2023 PCP: Albina GORMAN Dine, MD   Requesting physician: Clotilda Polo, MD Date of consultation: 11/25/23   Impression/Recommendations Principal Problem:   AV fistula thrombosis (HCC)  At time of call to [facility] for admission the plan was to admit patient, place temporary dialysis catheter tomorrow, and dialyze patient tomorrow.  Dr. Serene with vascular surgery available and at bedside now to place temporary catheter.  Mr. Alkhatib preference is to be dialyzed as soon as possible and not remain inpatient until Monday for his vascular procedure, he would prefer to go home tonight if possible.  Dr. Douglas with nephrology has placed orders for dialysis and dialysis charge nurse has confirmed that patient will be dialyzed at bedside in the ED tonight.  Plan to remove temporary catheter after dialysis session tonight and discharge patient home. I have spoken to house supervisor Landry Bunker, RN to request that ICU nurses be available to pull catheter tonight if ED RN is unable to.  Also discussed with ICU charge RN Megan.  Dr. Serene will arrange follow-up Monday for [name] and daughter has been instructed to call heart and vascular scheduler on Monday at 8 AM to get procedure time.  Patient to remain n.p.o. after 12AM 11/27/23.  [facility] will sign off at this time, thank you for involving us  in the care of this patient  Chief Complaint: Vascular access problem  HPI:  Matthew [name] is a 89 y.o. year old male with past medical history of CAD s/p CABG and coronary stents, ESRD on hemodialysis, atrial fibrillation, ascending aortic aneurysm, HFrEF, hypertension, hyperlipidemia, type II DM, squamous cell carcinoma of scalp.  Mr. Loeper was in his usual state of health and went to dialysis the day where they were unable to access his left AV fistula.  Patient and daughter at bedside report multiple recent issues with  his AV fistula requiring declotting with the most recent being on 6/19.   Review of Systems:  As per HPI  Past Medical History:  Diagnosis Date   Anginal pain (HCC)    Arthritis    Ascending aortic aneurysm (HCC) 07/18/2015   a.) TTE 07/18/2015: severe dilation of ascending aorta measuring 7.0 cm. b.) TTE 01/10/2018: aortic root 3.8 cm; ascending aorta 6.8 cm; refused surgical intervention/repair.   Atrial fibrillation (HCC)    a.) CHA2DS2-VASc = 6 (age x 2, HFrEF, HTN, previous MI, T2DM). b.) rate/rhythm maintained on amiodarone  + metoprolol  succinate; chronic antiplatelet therapy using clopidogrel    Bradycardia    Coronary artery disease    ESRD (end stage renal disease) (HCC)    GERD (gastroesophageal reflux disease)    HFrEF (heart failure with reduced ejection fraction) (HCC)    a.) TTE 12/03/2013: mod dec LV function; EF 35-40%; severe inferolateral and inferior HK; LA dilated; G3DD; PASP 52 mmHg. b.) TTE 07/18/2015: mild LV dysfunction with mild LVH; EF 45%; mild BAE. c.) TTE 01/10/2018: mod LV dysfunction; EF 45%; diffuse HK   HLD (hyperlipidemia)    Hypertension    IDA (iron deficiency anemia)    Long term current use of antithrombotics/antiplatelets    a.) clopidogrel    Myocardial infarction (HCC) 2000   Pneumonia    S/P CABG x 4 2001   a.) LIMA-LAD, SVG-D1, SVG-OM1, SVG-OM2, SVG-PDA   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   left frontal scalp, [facility]   Squamous cell carcinoma of skin 05/05/2020   left temporal scalp, in situ, [facility] 05/13/20  T2DM (type 2 diabetes mellitus) (HCC)    Valvular insufficiency 12/03/2013   a.) TTE 12/03/2013: EF 35-40%; mild AR/MR, b.) TTE 07/18/2015: EF 45%; mild AR/PR, mod TR, severe MR. c.) TTE 01/10/2018: mod AR/TR   Past Surgical History:  Procedure Laterality Date   A/V FISTULAGRAM Left 02/09/2023   Procedure: A/V Fistulagram;  Surgeon: Marea Selinda RAMAN, MD;  Location: [facility];  Service: Cardiovascular;  Laterality: Left;    A/V SHUNT INTERVENTION N/A 01/25/2023   Procedure: A/V SHUNT INTERVENTION;  Surgeon: Jama Cordella MATSU, MD;  Location: [facility];  Service: Cardiovascular;  Laterality: N/A;   A/V SHUNTOGRAM Left 04/01/2022   Procedure: A/V SHUNTOGRAM;  Surgeon: Marea Selinda RAMAN, MD;  Location: [facility];  Service: Cardiovascular;  Laterality: Left;   AV FISTULA PLACEMENT Left 04/15/2021   Procedure: INSERTION OF ARTERIOVENOUS (AV) GORE-TEX GRAFT ARM ( BRACHIAL AXILLARY);  Surgeon: Marea Selinda RAMAN, MD;  Location: [facility];  Service: Vascular;  Laterality: Left;   CATARACT EXTRACTION W/PHACO Left 07/26/2017   Procedure: CATARACT EXTRACTION PHACO AND INTRAOCULAR LENS PLACEMENT (IOC);  Surgeon: Jaye Fallow, MD;  Location: [facility];  Service: Ophthalmology;  Laterality: Left;  US    00:45.8 AP%  13.1 CDE  6.00 Fluid Pack Lot # [id number]   CATARACT EXTRACTION W/PHACO Right 08/17/2017   Procedure: CATARACT EXTRACTION PHACO AND INTRAOCULAR LENS PLACEMENT (IOC);  Surgeon: Jaye Fallow, MD;  Location: [facility];  Service: Ophthalmology;  Laterality: Right;  US   01:30 AP% 16.8 CDE 15.24 Fluid pack lot # [id number]   CHOLECYSTECTOMY     CORONARY ARTERY BYPASS GRAFT N/A 2001   Procedure: 5v CABG (LIMA-LAD, SVG-D1, SVG-OM1, SVG-OM2, SVG-PDA)   DIALYSIS/PERMA CATHETER INSERTION N/A 02/09/2018   Procedure: DIALYSIS/PERMA CATHETER INSERTION;  Surgeon: Marea Selinda RAMAN, MD;  Location: [facility];  Service: Cardiovascular;  Laterality: N/A;   DIALYSIS/PERMA CATHETER INSERTION N/A 02/09/2023   Procedure: DIALYSIS/PERMA CATHETER INSERTION;  Surgeon: Marea Selinda RAMAN, MD;  Location: [facility];  Service: Cardiovascular;  Laterality: N/A;   DIALYSIS/PERMA CATHETER REMOVAL N/A 06/25/2021   Procedure: DIALYSIS/PERMA CATHETER REMOVAL;  Surgeon: Marea Selinda RAMAN, MD;  Location: [facility];  Service: Cardiovascular;  Laterality: N/A;   FRACTURE SURGERY Left 2015   LEFT arm   INSERTION OF DIALYSIS  CATHETER Right 11/03/2020   Procedure: INSERTION OF Perm Cath in the RIGHT INTERNAL JUGULAR;  Surgeon: Marea Selinda RAMAN, MD;  Location: [facility];  Service: Vascular;  Laterality: Right;   INTRAMEDULLARY (IM) NAIL INTERTROCHANTERIC Right 11/12/2022   Procedure: INTRAMEDULLARY (IM) NAIL INTERTROCHANTERIC;  Surgeon: Marchia Drivers, MD;  Location: [facility];  Service: Orthopedics;  Laterality: Right;   LEFT HEART CATHETERIZATION WITH CORONARY/GRAFT ANGIOGRAM Left 12/04/2013   Procedure: LEFT HEART CATHETERIZATION WITH EL BILE;  Surgeon: Alm LELON Clay, MD;  Location: [facility] CATH LAB;  Service: Cardiovascular;  Laterality: Left;   LEFT HEART CATHETERIZATION WITH CORONARY/GRAFT ANGIOGRAM Left 02/06/2009   Procedure: LEFT HEART CATHETERIZATION WITH CORONARY/GRAFT ANGIOGRAM; Location: [facility]; Surgeon: Vita Bathe, MD   PERCUTANEOUS CORONARY STENT INTERVENTION (PCI-S) N/A 12/06/2013   Procedure: STAGED PERCUTANEOUS CORONARY STENT INTERVENTION (overlapping 4.0 x 38 mm (mid) and 4.0 x 28 mm (ostial) Promus Primier DES to SVG-RPDA graft);  Surgeon: Victory LELON Claudene DOUGLAS, MD;  Location: [facility] CATH LAB;  Service: Cardiovascular   PERIPHERAL VASCULAR THROMBECTOMY Left 08/31/2023   Procedure: PERIPHERAL VASCULAR THROMBECTOMY;  Surgeon: Jama Cordella MATSU, MD;  Location: [facility];  Service: Cardiovascular;  Laterality: Left;   REMOVAL OF A DIALYSIS CATHETER N/A 11/03/2020   Procedure: REMOVAL OF A DIALYSIS CATHETER;  Surgeon: Marea Selinda RAMAN, MD;  Location: [facility];  Service: Vascular;  Laterality: N/A;   TEMPORARY DIALYSIS CATHETER N/A 02/04/2023   Procedure: TEMPORARY DIALYSIS CATHETER;  Surgeon: Jama Cordella MATSU, MD;  Location: [facility];  Service: Cardiovascular;  Laterality: N/A;   TOTAL HIP REVISION Right 01/12/2023   Procedure: CONVERSION TO  RIGHT TOTAL HIP REPLACEMENT;  Surgeon: Edna Toribio LABOR, MD;  Location: [facility];  Service: Orthopedics;  Laterality: Right;   Social History:   reports that he quit smoking about 33 years ago. His smoking use included pipe and cigarettes. He has never used smokeless tobacco. He reports that he does not currently use alcohol after a past usage of about 1.0 standard drink of alcohol per week. He reports that he does not use drugs.  Allergies  Allergen Reactions   Amoxicillin Diarrhea   Family History  Problem Relation Age of Onset   Heart attack Father 6       died first MI at age 20   Heart failure Mother     Prior to Admission medications   Medication Sig Start Date End Date Taking? Authorizing Provider  midodrine  (PROAMATINE ) 10 MG tablet Take 10 mg by mouth 3 (three) times daily. Take 10 mg by mouth See administration instructions. Pt takes twice a day only on dialysis days 01/15/21  Yes [provider]  aspirin  EC 81 MG tablet Take 81 mg by mouth daily. Swallow whole.    [provider]  calcium  carbonate (TUMS EX) 750 MG chewable tablet Chew 1 tablet by mouth 3 (three) times daily.    [provider]  cetirizine (ZYRTEC) 10 MG tablet Take 10 mg by mouth daily.    [provider]  cholecalciferol  (VITAMIN D ) 25 MCG (1000 UNIT) tablet Take 1,000 Units by mouth daily with breakfast.    [provider]  clopidogrel  (PLAVIX ) 75 MG tablet TAKE 1 TABLET BY MOUTH DAILY 05/02/23   Fernand Denyse LABOR, MD  Continuous Glucose Receiver (FREESTYLE LIBRE 3 READER) DEVI 1 Device by Does not apply route daily. Patient not taking: Reported on 10/04/2023 03/15/23 12/08/25  Albina RAMAN Dine, MD  Continuous Glucose Sensor (FREESTYLE LIBRE 3 SENSOR) MISC 1 patch by Does not apply route as directed. Place 1 sensor on the skin every 14 days. Use to check glucose continuously Patient not taking: Reported on 10/04/2023 03/15/23 03/14/24  Tejan-Sie, S Ahmed, MD  ferrous sulfate  325 (65 FE) MG tablet Take 325 mg by mouth 2 (two) times daily with a meal.     [provider]  fluticasone  (FLONASE ) 50 MCG/ACT  nasal spray Place 2 sprays into both nostrils daily as needed for allergies or rhinitis.    [provider]  furosemide  (LASIX ) 20 MG tablet Take 1 tablet (20 mg total) by mouth daily. 11/17/23 11/16/24  Fernand Denyse LABOR, MD  glipiZIDE  2.5 MG TABS Take 1 tablet by mouth AC breakfast. 10/04/23 04/01/24  Tejan-Sie, S Ahmed, MD  levocetirizine (XYZAL ) 5 MG tablet Take 10 mg by mouth every evening. Patient not taking: Reported on 09/13/2023    [provider]  levothyroxine  (SYNTHROID ) 100 MCG tablet TAKE 1 TABLET BY MOUTH IN THE  MORNING 11/01/23   Albina RAMAN Dine, MD  lidocaine -prilocaine  (EMLA ) cream Apply 1 Application topically as needed (before dialysis).    [provider]  midodrine  (PROAMATINE ) 5 MG  tablet Take 1 tablet (5 mg total) by mouth 3 (three) times daily with meals. Patient taking differently: Take 5 mg by mouth 3 (three) times daily with meals. 5MG  1 HOUR BEFORE DIALYSIS 02/17/21   Shah, Vipul, MD  multivitamin (RENA-VIT) TABS tablet Take 1 tablet by mouth daily.    [provider]  nitroGLYCERIN  (NITROSTAT ) 0.4 MG SL tablet DISSOLVE 1 TABLET UNDER THE  TONGUE EVERY 5 MINUTES AS NEEDED FOR CHEST PAIN. MAX OF 3 TABLETS IN 15 MINUTES. CALL 911 IF PAIN  PERSISTS. 05/02/23   Fernand Denyse LABOR, MD  Omega-3 Fatty Acids (FISH OIL ) 1000 MG CAPS Take 4,000 mg by mouth daily with lunch.    [provider]  polyethylene glycol (MIRALAX  / GLYCOLAX ) 17 g packet Take 17 g by mouth daily. 01/06/21   Patel, Sona, MD  potassium chloride  SA (KLOR-CON  M) 20 MEQ tablet Take 20 mEq by mouth daily.    [provider]  rosuvastatin  (CRESTOR ) 40 MG tablet Take 1 tablet (40 mg total) by mouth daily. 10/04/23   Tejan-Sie, S Ahmed, MD  senna-docusate (SENOKOT-S) 8.6-50 MG tablet Take 2 tablets by mouth 2 (two) times daily. 01/05/21   Patel, Sona, MD  traZODone  (DESYREL ) 50 MG tablet Take 50 mg by mouth at bedtime as needed for sleep (1/2 Tablet as needed).    [provider]   Physical Exam: Blood pressure 105/62, pulse 60, temperature 98.1 F (36.7 C), temperature source Oral, resp. rate (!) 22, height 6' (1.829 m), weight 88.9 kg, SpO2 91%. Vitals:   11/25/23 1500 11/25/23 1530  BP: 108/68 105/62  Pulse: (!) 45 60  Resp: 20 (!) 22  Temp:    SpO2: 90% 91%    Constitutional: Elderly Caucasian gentleman NAD, calm, comfortable Respiratory: clear to auscultation bilaterally, no wheezing, no crackles. Normal respiratory effort. No accessory muscle use.  Cardiovascular: Regular rate and rhythm, no murmurs / rubs / gallops. No extremity edema. 2+ radial and pedal pulses.  Abdomen: Soft, nontender.  No tenderness, no masses palpated. Bowel sounds positive x4 quadrants.  Musculoskeletal: no clubbing / cyanosis. No joint deformity upper and lower extremities. Good ROM, no contractures. Normal muscle tone.  Skin: Warm, dry.  No rashes, lesions, ulcers.  Neurologic: CN 2-12 grossly intact. Alert and oriented x 3.    Labs on Admission:  Basic Metabolic Panel: Recent Labs  Lab 11/25/23 1320  NA 137  K 4.8  CL 102  CO2 24  GLUCOSE 117*  BUN 70*  CREATININE 3.33*  CALCIUM  9.7   Liver Function Tests: Recent Labs  Lab 11/25/23 1320  AST 31  ALT 24  ALKPHOS 227*  BILITOT 0.9  PROT 7.1  ALBUMIN  3.4*   No results for input(s): LIPASE, AMYLASE in the last 168 hours. No results for input(s): AMMONIA in the last 168 hours. CBC: Recent Labs  Lab 11/25/23 1320  WBC 5.5  NEUTROABS 3.7  HGB 11.8*  HCT 35.7*  MCV 97.8  PLT 102*   Cardiac Enzymes: No results for input(s): CKTOTAL, CKMB, CKMBINDEX, TROPONINI in the last 168 hours. BNP: Invalid input(s): POCBNP CBG: No results for input(s): GLUCAP in the last 168 hours.  Radiological Exams on Admission: No results found.   Time spent: 45 mins  To reach the provider On-Call:   7AM- 7PM see care teams to locate the attending and reach out to them via  [email or web address] Password: [email or web address] 7PM-7AM contact night-coverage If you still have difficulty reaching the appropriate provider, please page the  DOC (Director on Call) for [facility] on amion for assistance  This document was prepared using Conservation officer, historic buildings and may include unintentional dictation errors.  Rockie Rams FNP-BC, PMHNP-BC Nurse Practitioner [facility]

## 2023-11-25 NOTE — ED Notes (Signed)
 Fall precautions in place for Pt. This RN placed fall band, fall grip socks, bed alarm and fall sign.

## 2023-11-25 NOTE — Progress Notes (Signed)
 Treatment End

## 2023-11-25 NOTE — ED Notes (Signed)
 Advised nurse that patient has ready bed

## 2023-11-25 NOTE — Progress Notes (Signed)
Hd start 

## 2023-11-25 NOTE — ED Notes (Signed)
 MD Brabham at the bedside for dialysis cath placement.

## 2023-11-25 NOTE — ED Notes (Signed)
 EDP notified of soft BP see new orders.

## 2023-11-25 NOTE — ED Provider Notes (Signed)
 Per discussion with Kolluru patient will be okay for discharge after dialysis. Will just need temp catheter removed. Plan will then have patient follow up on Monday for further management.   Floy Roberts, MD 11/25/23 [phone]

## 2023-11-25 NOTE — Progress Notes (Signed)
Pre HD  

## 2023-11-25 NOTE — ED Triage Notes (Addendum)
 Pt has had multiple surgical procedures to his dialysis access over the past 2 weeks. Pt dialyzes MWF, last treatment was Wednesday. Thursday last week, he went to [facility] to have his access unclogged. No thrill or bruit heard/felt. Pt follows with Dr Marcelino from nephrology.

## 2023-11-25 NOTE — H&P (View-Only) (Signed)
 [facility] [place]  Patient name: Matthew [name] MRN: [id number] DOB: 1930-08-01 Sex: male   REQUESTING PROVIDER:   ER   REASON FOR CONSULT:    Clotted dialysis graft  HISTORY OF PRESENT ILLNESS:   Matthew [name] is a 89 y.o. male, who has end-stage renal disease on dialysis Monday Wednesday Friday.  He dialyzes through a left upper arm graft.  He has had 3 de-clots within the past month or 2, 2 of which were done at different institutions.  He went to dialysis earlier today and his graft has reoccluded.  He was sent to the ER.  PAST MEDICAL HISTORY    Past Medical History:  Diagnosis Date   Anginal pain (HCC)    Arthritis    Ascending aortic aneurysm (HCC) 07/18/2015   a.) TTE 07/18/2015: severe dilation of ascending aorta measuring 7.0 cm. b.) TTE 01/10/2018: aortic root 3.8 cm; ascending aorta 6.8 cm; refused surgical intervention/repair.   Atrial fibrillation (HCC)    a.) CHA2DS2-VASc = 6 (age x 2, HFrEF, HTN, previous MI, T2DM). b.) rate/rhythm maintained on amiodarone  + metoprolol  succinate; chronic antiplatelet therapy using clopidogrel    Bradycardia    Coronary artery disease    ESRD (end stage renal disease) (HCC)    GERD (gastroesophageal reflux disease)    HFrEF (heart failure with reduced ejection fraction) (HCC)    a.) TTE 12/03/2013: mod dec LV function; EF 35-40%; severe inferolateral and inferior HK; LA dilated; G3DD; PASP 52 mmHg. b.) TTE 07/18/2015: mild LV dysfunction with mild LVH; EF 45%; mild BAE. c.) TTE 01/10/2018: mod LV dysfunction; EF 45%; diffuse HK   HLD (hyperlipidemia)    Hypertension    IDA (iron deficiency anemia)    Long term current use of antithrombotics/antiplatelets    a.) clopidogrel    Myocardial infarction (HCC) 2000   Pneumonia    S/P CABG x 4 2001   a.) LIMA-LAD, SVG-D1, SVG-OM1, SVG-OM2, SVG-PDA   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   left frontal scalp, EDC    Squamous cell carcinoma of skin 05/05/2020   left temporal scalp, in situ, EDC 05/13/20   T2DM (type 2 diabetes mellitus) (HCC)    Valvular insufficiency 12/03/2013   a.) TTE 12/03/2013: EF 35-40%; mild AR/MR, b.) TTE 07/18/2015: EF 45%; mild AR/PR, mod TR, severe MR. c.) TTE 01/10/2018: mod AR/TR     FAMILY HISTORY   Family History  Problem Relation Age of Onset   Heart attack Father 54       died first MI at age 60   Heart failure Mother     SOCIAL HISTORY:   Social History   Socioeconomic History   Marital status: Widowed    Spouse name: Not on file   Number of children: Not on file   Years of education: Not on file   Highest education level: Not on file  Occupational History   Not on file  Tobacco Use   Smoking status: Former    Current packs/day: 0.00    Types: Pipe, Cigarettes    Quit date: 78    Years since quitting: 33.5   Smokeless tobacco: Never  Vaping Use   Vaping status: Never Used  Substance and Sexual Activity   Alcohol use: Not Currently    Alcohol/week: 1.0 standard drink of alcohol    Types: 1 Cans of beer per week    Comment: occasionally drinking only,once a month   Drug use: No   Sexual activity:  Never  Other Topics Concern   Not on file  Social History Narrative   Lives with daughter and son in law   Social Drivers of Health   Financial Resource Strain: Low Risk  (01/10/2018)   Overall Financial Resource Strain (CARDIA)    Difficulty of Paying Living Expenses: Not hard at all  Food Insecurity: No Food Insecurity (02/04/2023)   Hunger Vital Sign    Worried About Running Out of Food in the Last Year: Never true    Ran Out of Food in the Last Year: Never true  Transportation Needs: No Transportation Needs (02/04/2023)   PRAPARE - Administrator, Civil Service (Medical): No    Lack of Transportation (Non-Medical): No  Physical Activity: Inactive (01/10/2018)   Exercise Vital Sign    Days of Exercise per Week: 0 days     Minutes of Exercise per Session: 0 min  Stress: No Stress Concern Present (01/10/2018)   [employer] of Occupational Health - Occupational Stress Questionnaire    Feeling of Stress : Not at all  Social Connections: Socially Integrated (01/10/2018)   Social Connection and Isolation Panel    Frequency of Communication with Friends and Family: More than three times a week    Frequency of Social Gatherings with Friends and Family: More than three times a week    Attends Religious Services: More than 4 times per year    Active Member of [employer] or Organizations: Yes    Attends Banker Meetings: More than 4 times per year    Marital Status: Married  Catering manager Violence: Not At Risk (02/04/2023)   Humiliation, Afraid, Rape, and Kick questionnaire    Fear of Current or Ex-Partner: No    Emotionally Abused: No    Physically Abused: No    Sexually Abused: No    ALLERGIES:    Allergies  Allergen Reactions   Amoxicillin Diarrhea    CURRENT MEDICATIONS:    Current Facility-Administered Medications  Medication Dose Route Frequency Provider Last Rate Last Admin   [START ON 11/26/2023] Chlorhexidine  Gluconate Cloth 2 % PADS 6 each  6 each Topical Q0600 Kolluru, Sarath, MD       Current Outpatient Medications  Medication Sig Dispense Refill   midodrine  (PROAMATINE ) 10 MG tablet Take 10 mg by mouth 3 (three) times daily. Take 10 mg by mouth See administration instructions. Pt takes twice a day only on dialysis days     aspirin  EC 81 MG tablet Take 81 mg by mouth daily. Swallow whole.     calcium  carbonate (TUMS EX) 750 MG chewable tablet Chew 1 tablet by mouth 3 (three) times daily.     cetirizine (ZYRTEC) 10 MG tablet Take 10 mg by mouth daily.     cholecalciferol  (VITAMIN D ) 25 MCG (1000 UNIT) tablet Take 1,000 Units by mouth daily with breakfast.     clopidogrel  (PLAVIX ) 75 MG tablet TAKE 1 TABLET BY MOUTH DAILY 90 tablet 3   Continuous Glucose Receiver (FREESTYLE LIBRE  3 READER) DEVI 1 Device by Does not apply route daily. (Patient not taking: Reported on 10/04/2023) 1 each 0   Continuous Glucose Sensor (FREESTYLE LIBRE 3 SENSOR) MISC 1 patch by Does not apply route as directed. Place 1 sensor on the skin every 14 days. Use to check glucose continuously (Patient not taking: Reported on 10/04/2023) 2 each 11   ferrous sulfate  325 (65 FE) MG tablet Take 325 mg by mouth 2 (two) times daily with a  meal.      fluticasone  (FLONASE ) 50 MCG/ACT nasal spray Place 2 sprays into both nostrils daily as needed for allergies or rhinitis.     furosemide  (LASIX ) 20 MG tablet Take 1 tablet (20 mg total) by mouth daily. 30 tablet 11   glipiZIDE  2.5 MG TABS Take 1 tablet by mouth AC breakfast. 90 tablet 1   levocetirizine (XYZAL ) 5 MG tablet Take 10 mg by mouth every evening. (Patient not taking: Reported on 09/13/2023)     levothyroxine  (SYNTHROID ) 100 MCG tablet TAKE 1 TABLET BY MOUTH IN THE  MORNING 90 tablet 3   lidocaine -prilocaine  (EMLA ) cream Apply 1 Application topically as needed (before dialysis).     midodrine  (PROAMATINE ) 5 MG tablet Take 1 tablet (5 mg total) by mouth 3 (three) times daily with meals. (Patient taking differently: Take 5 mg by mouth 3 (three) times daily with meals. 5MG  1 HOUR BEFORE DIALYSIS)     multivitamin (RENA-VIT) TABS tablet Take 1 tablet by mouth daily.     nitroGLYCERIN  (NITROSTAT ) 0.4 MG SL tablet DISSOLVE 1 TABLET UNDER THE  TONGUE EVERY 5 MINUTES AS NEEDED FOR CHEST PAIN. MAX OF 3 TABLETS IN 15 MINUTES. CALL 911 IF PAIN  PERSISTS. 100 tablet 3   Omega-3 Fatty Acids (FISH OIL ) 1000 MG CAPS Take 4,000 mg by mouth daily with lunch.     polyethylene glycol (MIRALAX  / GLYCOLAX ) 17 g packet Take 17 g by mouth daily. 14 each 0   potassium chloride  SA (KLOR-CON  M) 20 MEQ tablet Take 20 mEq by mouth daily.     rosuvastatin  (CRESTOR ) 40 MG tablet Take 1 tablet (40 mg total) by mouth daily. 90 tablet 3   senna-docusate (SENOKOT-S) 8.6-50 MG tablet Take 2  tablets by mouth 2 (two) times daily. 30 tablet 0   traZODone  (DESYREL ) 50 MG tablet Take 50 mg by mouth at bedtime as needed for sleep (1/2 Tablet as needed).      REVIEW OF SYSTEMS:   [X]  denotes positive finding, [ ]  denotes negative finding Cardiac  Comments:  Chest pain or chest pressure:    Shortness of breath upon exertion:    Short of breath when lying flat:    Irregular heart rhythm:        Vascular    Pain in calf, thigh, or hip brought on by ambulation:    Pain in feet at night that wakes you up from your sleep:     Blood clot in your veins:    Leg swelling:         Pulmonary    Oxygen at home:    Productive cough:     Wheezing:         Neurologic    Sudden weakness in arms or legs:     Sudden numbness in arms or legs:     Sudden onset of difficulty speaking or slurred speech:    Temporary loss of vision in one eye:     Problems with dizziness:         Gastrointestinal    Blood in stool:      Vomited blood:         Genitourinary    Burning when urinating:     Blood in urine:        Psychiatric    Major depression:         Hematologic    Bleeding problems:    Problems with blood clotting too easily:  Skin    Rashes or ulcers:        Constitutional    Fever or chills:     PHYSICAL EXAM:   Vitals:   11/25/23 1430 11/25/23 1500 11/25/23 1530 11/25/23 1730  BP: (!) 111/50 108/68 105/62 105/68  Pulse: 69 (!) 45 60 96  Resp: 16 20 (!) 22 14  Temp:      TempSrc:      SpO2: 96% 90% 91% 97%  Weight:      Height:        GENERAL: The patient is a well-nourished male, in no acute distress. The vital signs are documented above. CARDIAC: There is a regular rate and rhythm.  VASCULAR: Occluded left upper arm graft PULMONARY: Nonlabored respirations ABDOMEN: Soft and non-tender with normal pitched bowel sounds.  MUSCULOSKELETAL: There are no major deformities or cyanosis. NEUROLOGIC: No focal weakness or paresthesias are detected. SKIN: There  are no ulcers or rashes noted. PSYCHIATRIC: The patient has a normal affect.  STUDIES:   None  ASSESSMENT and PLAN   End-stage renal disease: I discussed with the patient and his daughter that we will place a temporary catheter for him to receive dialysis today and I will schedule him for a tunneled catheter as an outpatient on Monday.  Since his graft has clotted 3 times in the last 6-8 weeks, I think the best course of action is to explore new access.  He has never had access in the right arm.  This will be addressed when he comes in for his catheter.   Malvina Serene CLORE, MD, FACS [facility] [phone] Pager [phone]

## 2023-11-25 NOTE — Op Note (Signed)
    Patient name: Matthew [name] MRN: [id number] DOB: 04-Dec-1930 Sex: male   Pre-operative Diagnosis: Occluded left upper arm dialysis graft Post-operative diagnosis:  Same Surgeon:  Malvina New Procedure:   Ultrasound guided placement of a right femoral trialysis catheter  Indications: This is a 89 year old gentleman in need of dialysis.  He has a clotted left upper arm graft.  He will get a temporary catheter.  This was discussed with his daughter and the patient and he wishes to proceed.  Procedure: The patient is in ER room 12.  The right groin was prepped and draped in usual sterile fashion.  Ultrasound was used to evaluate the right common femoral vein which was widely patent and easily compressible.  1% lidocaine  was used for local anesthesia.  The right common femoral vein was cannulated under ultrasound guidance with an 18-gauge needle.  This was done with a single pass.  A J-wire was then inserted.  11 blade was used to make a skin nick.  The skin was dilated with the dilators and a 30 cm trialysis catheter was placed.  All 3 ports flushed and aspirated without difficulty.  The catheter was sutured to the skin and sterile dressings were applied.   Impression: Successful placement of temporary right femoral dialysis catheter which is ready for use.   ALONSO Malvina New, M.D., [facility] [place] Office: [phone] Pager:  [phone]

## 2023-11-26 LAB — HEPATITIS B SURFACE ANTIGEN: Hepatitis B Surface Ag: NONREACTIVE

## 2023-11-26 NOTE — Progress Notes (Signed)
 Right Femoral HD catheter removed per policy, pressure held x 20 minutes, vaseline gauze and 4x4 applied. Patient tolerated procedure well, VSS.

## 2023-11-27 DIAGNOSIS — N186 End stage renal disease: Secondary | ICD-10-CM

## 2023-11-28 ENCOUNTER — Ambulatory Visit: Admit: 2023-11-28 | Admitting: Vascular Surgery

## 2023-11-28 ENCOUNTER — Other Ambulatory Visit: Payer: Self-pay

## 2023-11-28 ENCOUNTER — Telehealth (INDEPENDENT_AMBULATORY_CARE_PROVIDER_SITE_OTHER): Payer: Self-pay

## 2023-11-28 ENCOUNTER — Ambulatory Visit
Admission: RE | Admit: 2023-11-28 | Discharge: 2023-11-28 | Disposition: A | Discharge status: Home / Self Care | Attending: Vascular Surgery | Admitting: Vascular Surgery

## 2023-11-28 ENCOUNTER — Other Ambulatory Visit (INDEPENDENT_AMBULATORY_CARE_PROVIDER_SITE_OTHER): Payer: Self-pay | Admitting: Nurse Practitioner

## 2023-11-28 ENCOUNTER — Encounter: Payer: Self-pay | Admitting: Vascular Surgery

## 2023-11-28 ENCOUNTER — Encounter
Admission: RE | Disposition: A | Discharge status: Home / Self Care | Payer: Self-pay | Source: Home / Self Care | Attending: Vascular Surgery

## 2023-11-28 DIAGNOSIS — Z7984 Long term (current) use of oral hypoglycemic drugs: Secondary | ICD-10-CM | POA: Diagnosis not present

## 2023-11-28 DIAGNOSIS — Z992 Dependence on renal dialysis: Secondary | ICD-10-CM | POA: Diagnosis not present

## 2023-11-28 DIAGNOSIS — T82898A Other specified complication of vascular prosthetic devices, implants and grafts, initial encounter: Secondary | ICD-10-CM

## 2023-11-28 DIAGNOSIS — N186 End stage renal disease: Secondary | ICD-10-CM | POA: Diagnosis not present

## 2023-11-28 DIAGNOSIS — Z79899 Other long term (current) drug therapy: Secondary | ICD-10-CM | POA: Insufficient documentation

## 2023-11-28 DIAGNOSIS — Y832 Surgical operation with anastomosis, bypass or graft as the cause of abnormal reaction of the patient, or of later complication, without mention of misadventure at the time of the procedure: Secondary | ICD-10-CM | POA: Diagnosis not present

## 2023-11-28 DIAGNOSIS — E1122 Type 2 diabetes mellitus with diabetic chronic kidney disease: Secondary | ICD-10-CM | POA: Insufficient documentation

## 2023-11-28 DIAGNOSIS — Z87891 Personal history of nicotine dependence: Secondary | ICD-10-CM | POA: Diagnosis not present

## 2023-11-28 DIAGNOSIS — I132 Hypertensive heart and chronic kidney disease with heart failure and with stage 5 chronic kidney disease, or end stage renal disease: Secondary | ICD-10-CM | POA: Insufficient documentation

## 2023-11-28 DIAGNOSIS — T82868A Thrombosis of vascular prosthetic devices, implants and grafts, initial encounter: Secondary | ICD-10-CM | POA: Insufficient documentation

## 2023-11-28 DIAGNOSIS — I5022 Chronic systolic (congestive) heart failure: Secondary | ICD-10-CM | POA: Diagnosis not present

## 2023-11-28 HISTORY — PX: DIALYSIS/PERMA CATHETER INSERTION: CATH118288

## 2023-11-28 LAB — HEPATITIS B SURFACE ANTIBODY, QUANTITATIVE: Hep B S AB Quant (Post): 16.4 m[IU]/mL

## 2023-11-28 LAB — GLUCOSE, CAPILLARY: Glucose-Capillary: 94 mg/dL (ref 70–99)

## 2023-11-28 LAB — POTASSIUM (ARMC VASCULAR LAB ONLY): Potassium (ARMC vascular lab): 4.8 mmol/L (ref 3.5–5.1)

## 2023-11-28 SURGERY — DIALYSIS/PERMA CATHETER INSERTION
Anesthesia: Moderate Sedation

## 2023-11-28 MED ORDER — LIDOCAINE-EPINEPHRINE (PF) 1 %-1:200000 IJ SOLN
INTRAMUSCULAR | Status: DC | PRN
  Administered 2023-11-28: 20 mL via INTRADERMAL

## 2023-11-28 MED ORDER — FAMOTIDINE 20 MG PO TABS: 40.0000 mg | ORAL_TABLET | Freq: Once | ORAL | Status: DC | PRN

## 2023-11-28 MED ORDER — VANCOMYCIN HCL IN DEXTROSE 1-5 GM/200ML-% IV SOLN
INTRAVENOUS | Status: AC
  Filled 2023-11-28: qty 200

## 2023-11-28 MED ORDER — HYDROMORPHONE HCL 1 MG/ML IJ SOLN: 1.0000 mg | Freq: Once | INTRAMUSCULAR | Status: DC | PRN

## 2023-11-28 MED ORDER — MIDAZOLAM HCL 2 MG/2ML IJ SOLN
INTRAMUSCULAR | Status: DC | PRN
  Administered 2023-11-28: 1 mg via INTRAVENOUS

## 2023-11-28 MED ORDER — FENTANYL CITRATE (PF) 100 MCG/2ML IJ SOLN
INTRAMUSCULAR | Status: DC | PRN
  Administered 2023-11-28: 25 ug via INTRAVENOUS

## 2023-11-28 MED ORDER — HEPARIN SODIUM (PORCINE) 10000 UNIT/ML IJ SOLN
INTRAMUSCULAR | Status: DC | PRN
  Administered 2023-11-28: 10000 [IU]

## 2023-11-28 MED ORDER — FENTANYL CITRATE PF 50 MCG/ML IJ SOSY
PREFILLED_SYRINGE | INTRAMUSCULAR | Status: AC
Start: 2023-11-28 — End: 2023-11-28
  Filled 2023-11-28: qty 1

## 2023-11-28 MED ORDER — SODIUM CHLORIDE 0.9 % IV SOLN: INTRAVENOUS | Status: DC

## 2023-11-28 MED ORDER — HEPARIN (PORCINE) IN NACL 1000-0.9 UT/500ML-% IV SOLN
INTRAVENOUS | Status: DC | PRN
  Administered 2023-11-28: 500 mL

## 2023-11-28 MED ORDER — METHYLPREDNISOLONE SODIUM SUCC 125 MG IJ SOLR: 125.0000 mg | Freq: Once | INTRAMUSCULAR | Status: DC | PRN

## 2023-11-28 MED ORDER — HEPARIN SODIUM (PORCINE) 1000 UNIT/ML IJ SOLN
INTRAMUSCULAR | Status: AC
  Filled 2023-11-28: qty 10

## 2023-11-28 MED ORDER — ONDANSETRON HCL 4 MG/2ML IJ SOLN: 4.0000 mg | Freq: Four times a day (QID) | INTRAMUSCULAR | Status: DC | PRN

## 2023-11-28 MED ORDER — DIPHENHYDRAMINE HCL 50 MG/ML IJ SOLN: 50.0000 mg | Freq: Once | INTRAMUSCULAR | Status: DC | PRN

## 2023-11-28 MED ORDER — CEFAZOLIN SODIUM-DEXTROSE 1-4 GM/50ML-% IV SOLN
INTRAVENOUS | Status: AC
  Filled 2023-11-28: qty 50

## 2023-11-28 MED ORDER — MIDAZOLAM HCL 2 MG/2ML IJ SOLN
INTRAMUSCULAR | Status: AC
  Filled 2023-11-28: qty 2

## 2023-11-28 MED ORDER — VANCOMYCIN HCL IN DEXTROSE 1-5 GM/200ML-% IV SOLN
1000.0000 mg | INTRAVENOUS | Status: AC
  Administered 2023-11-28: 1000 mg via INTRAVENOUS

## 2023-11-28 MED ORDER — MIDAZOLAM HCL 2 MG/ML PO SYRP
8.0000 mg | ORAL_SOLUTION | Freq: Once | ORAL | Status: DC | PRN
Start: 2023-11-28 — End: 2023-11-28

## 2023-11-28 SURGICAL SUPPLY — 6 items
BIOPATCH RED 1 DISK 7.0 (GAUZE/BANDAGES/DRESSINGS) IMPLANT
CATH CANNON HEMO 15FR 19 (HEMODIALYSIS SUPPLIES) IMPLANT
DERMABOND ADVANCED .7 DNX12 (GAUZE/BANDAGES/DRESSINGS) IMPLANT
PACK ANGIOGRAPHY (CUSTOM PROCEDURE TRAY) ×1 IMPLANT
SUT MNCRL AB 4-0 PS2 18 (SUTURE) IMPLANT
SUT PROLENE 0 CT 1 30 (SUTURE) IMPLANT

## 2023-11-28 NOTE — Telephone Encounter (Signed)
 Patient's daughter started calling our office at 7:30 am this morning asking about the patient being scheduled for a permcath insertion with Dr. Marea. The patient went to he ED over the weekend and was seen, had a temp cath placed, then removed and sent home. Patient's daughter was told to call our office to get the time the patient would have his procedure on Monday with Dr. Marea. There wasn't any information sent to our office regarding this patient. I did speak with Specials and per them advised the daughter to bring the patient to the [facility] and make sure he is NPO. The daughter was informed to bring the patient to the [facility].

## 2023-11-28 NOTE — Interval H&P Note (Signed)
 History and Physical Interval Note:  11/28/2023 11:44 AM  Matthew [name]  has presented today for surgery, with the diagnosis of End stage renal disease.  The various methods of treatment have been discussed with the patient and family. After consideration of risks, benefits and other options for treatment, the patient has consented to  Procedure(s): DIALYSIS/PERMA CATHETER INSERTION (N/A) as a surgical intervention.  The patient's history has been reviewed, patient examined, no change in status, stable for surgery.  I have reviewed the patient's chart and labs.  Questions were answered to the patient's satisfaction.     Corvette Orser

## 2023-11-28 NOTE — Op Note (Signed)
 OPERATIVE NOTE    PRE-OPERATIVE DIAGNOSIS: 1. ESRD   POST-OPERATIVE DIAGNOSIS: same as above  PROCEDURE: Ultrasound guidance for vascular access to the right internal jugular vein Fluoroscopic guidance for placement of catheter Placement of a 19 cm tip to cuff tunneled hemodialysis catheter via the right internal jugular vein  SURGEON: Selinda Gu, MD  ANESTHESIA:  Local with Moderate conscious sedation for approximately 19 minutes using 1 mg of Versed  and 25 mcg of Fentanyl   ESTIMATED BLOOD LOSS: 5 cc  FLUORO TIME: less than one minute  CONTRAST: none  FINDING(S): 1.  Patent right internal jugular vein  SPECIMEN(S):  None  INDICATIONS:   Matthew [name] is a 89 y.o.male who presents with renal failure and a failed arm access.  The patient needs long term dialysis access for their ESRD, and a Permcath is necessary.  Risks and benefits are discussed and informed consent is obtained.    DESCRIPTION: After obtaining full informed written consent, the patient was brought back to the vascular suited. The patient's right neck and chest were sterilely prepped and draped in a sterile surgical field was created. Moderate conscious sedation was administered during a face to face encounter with the patient throughout the procedure with my supervision of the RN administering medicines and monitoring the patient's vital signs, pulse oximetry, telemetry and mental status throughout from the start of the procedure until the patient was taken to the recovery room.  The right internal jugular vein was visualized with ultrasound and found to be patent. It was then accessed under direct ultrasound guidance and a permanent image was recorded. A wire was placed. After skin nick and dilatation, the peel-away sheath was placed over the wire. I then turned my attention to an area under the clavicle. Approximately 1-2 fingerbreadths below the clavicle a small counterincision was created and tunneled from the  subclavicular incision to the access site. Using fluoroscopic guidance, a 19 centimeter tip to cuff tunneled hemodialysis catheter was selected, and tunneled from the subclavicular incision to the access site. It was then placed through the peel-away sheath and the peel-away sheath was removed. Using fluoroscopic guidance the catheter tips were parked in the right atrium. The appropriate distal connectors were placed. It withdrew blood well and flushed easily with heparinized saline and a concentrated heparin  solution was then placed. It was secured to the chest wall with 2 Prolene sutures. The access incision was closed single 4-0 Monocryl. A 4-0 Monocryl pursestring suture was placed around the exit site. Sterile dressings were placed. The patient tolerated the procedure well and was taken to the recovery room in stable condition.  COMPLICATIONS: None  CONDITION: Stable  Selinda Gu, MD 11/28/2023 11:45 AM   This note was created with Dragon Medical transcription system. Any errors in dictation are purely unintentional.

## 2023-11-29 DIAGNOSIS — N186 End stage renal disease: Secondary | ICD-10-CM | POA: Diagnosis not present

## 2023-11-29 DIAGNOSIS — D631 Anemia in chronic kidney disease: Secondary | ICD-10-CM | POA: Diagnosis not present

## 2023-11-29 DIAGNOSIS — N250 Renal osteodystrophy: Secondary | ICD-10-CM | POA: Diagnosis not present

## 2023-11-29 DIAGNOSIS — Z992 Dependence on renal dialysis: Secondary | ICD-10-CM | POA: Diagnosis not present

## 2023-11-29 DIAGNOSIS — D509 Iron deficiency anemia, unspecified: Secondary | ICD-10-CM | POA: Diagnosis not present

## 2023-11-30 DIAGNOSIS — N250 Renal osteodystrophy: Secondary | ICD-10-CM | POA: Diagnosis not present

## 2023-11-30 DIAGNOSIS — D631 Anemia in chronic kidney disease: Secondary | ICD-10-CM | POA: Diagnosis not present

## 2023-11-30 DIAGNOSIS — D509 Iron deficiency anemia, unspecified: Secondary | ICD-10-CM | POA: Diagnosis not present

## 2023-11-30 DIAGNOSIS — Z992 Dependence on renal dialysis: Secondary | ICD-10-CM | POA: Diagnosis not present

## 2023-11-30 DIAGNOSIS — N186 End stage renal disease: Secondary | ICD-10-CM | POA: Diagnosis not present

## 2023-12-02 DIAGNOSIS — N250 Renal osteodystrophy: Secondary | ICD-10-CM | POA: Diagnosis not present

## 2023-12-02 DIAGNOSIS — Z992 Dependence on renal dialysis: Secondary | ICD-10-CM | POA: Diagnosis not present

## 2023-12-02 DIAGNOSIS — N186 End stage renal disease: Secondary | ICD-10-CM | POA: Diagnosis not present

## 2023-12-02 DIAGNOSIS — D509 Iron deficiency anemia, unspecified: Secondary | ICD-10-CM | POA: Diagnosis not present

## 2023-12-02 DIAGNOSIS — D631 Anemia in chronic kidney disease: Secondary | ICD-10-CM | POA: Diagnosis not present

## 2023-12-06 DIAGNOSIS — N186 End stage renal disease: Secondary | ICD-10-CM | POA: Diagnosis not present

## 2023-12-06 DIAGNOSIS — D509 Iron deficiency anemia, unspecified: Secondary | ICD-10-CM | POA: Diagnosis not present

## 2023-12-06 DIAGNOSIS — Z992 Dependence on renal dialysis: Secondary | ICD-10-CM | POA: Diagnosis not present

## 2023-12-06 DIAGNOSIS — N250 Renal osteodystrophy: Secondary | ICD-10-CM | POA: Diagnosis not present

## 2023-12-06 DIAGNOSIS — D631 Anemia in chronic kidney disease: Secondary | ICD-10-CM | POA: Diagnosis not present

## 2023-12-07 DIAGNOSIS — D631 Anemia in chronic kidney disease: Secondary | ICD-10-CM | POA: Diagnosis not present

## 2023-12-07 DIAGNOSIS — N186 End stage renal disease: Secondary | ICD-10-CM | POA: Diagnosis not present

## 2023-12-07 DIAGNOSIS — D509 Iron deficiency anemia, unspecified: Secondary | ICD-10-CM | POA: Diagnosis not present

## 2023-12-07 DIAGNOSIS — N250 Renal osteodystrophy: Secondary | ICD-10-CM | POA: Diagnosis not present

## 2023-12-07 DIAGNOSIS — Z992 Dependence on renal dialysis: Secondary | ICD-10-CM | POA: Diagnosis not present

## 2023-12-09 DIAGNOSIS — N250 Renal osteodystrophy: Secondary | ICD-10-CM | POA: Diagnosis not present

## 2023-12-09 DIAGNOSIS — Z992 Dependence on renal dialysis: Secondary | ICD-10-CM | POA: Diagnosis not present

## 2023-12-09 DIAGNOSIS — N186 End stage renal disease: Secondary | ICD-10-CM | POA: Diagnosis not present

## 2023-12-09 DIAGNOSIS — D509 Iron deficiency anemia, unspecified: Secondary | ICD-10-CM | POA: Diagnosis not present

## 2023-12-09 DIAGNOSIS — D631 Anemia in chronic kidney disease: Secondary | ICD-10-CM | POA: Diagnosis not present

## 2023-12-12 DIAGNOSIS — Z992 Dependence on renal dialysis: Secondary | ICD-10-CM | POA: Diagnosis not present

## 2023-12-12 DIAGNOSIS — D509 Iron deficiency anemia, unspecified: Secondary | ICD-10-CM | POA: Diagnosis not present

## 2023-12-12 DIAGNOSIS — N250 Renal osteodystrophy: Secondary | ICD-10-CM | POA: Diagnosis not present

## 2023-12-12 DIAGNOSIS — D631 Anemia in chronic kidney disease: Secondary | ICD-10-CM | POA: Diagnosis not present

## 2023-12-12 DIAGNOSIS — N186 End stage renal disease: Secondary | ICD-10-CM | POA: Diagnosis not present

## 2023-12-14 DIAGNOSIS — D631 Anemia in chronic kidney disease: Secondary | ICD-10-CM | POA: Diagnosis not present

## 2023-12-14 DIAGNOSIS — N186 End stage renal disease: Secondary | ICD-10-CM | POA: Diagnosis not present

## 2023-12-14 DIAGNOSIS — D509 Iron deficiency anemia, unspecified: Secondary | ICD-10-CM | POA: Diagnosis not present

## 2023-12-14 DIAGNOSIS — Z992 Dependence on renal dialysis: Secondary | ICD-10-CM | POA: Diagnosis not present

## 2023-12-14 DIAGNOSIS — N250 Renal osteodystrophy: Secondary | ICD-10-CM | POA: Diagnosis not present

## 2023-12-15 ENCOUNTER — Ambulatory Visit: Admitting: Cardiovascular Disease

## 2023-12-15 ENCOUNTER — Encounter: Payer: Self-pay | Admitting: Cardiovascular Disease

## 2023-12-15 VITALS — BP 104/60 | HR 89 | Ht 72.0 in | Wt 192.4 lb

## 2023-12-15 DIAGNOSIS — N186 End stage renal disease: Secondary | ICD-10-CM | POA: Diagnosis not present

## 2023-12-15 DIAGNOSIS — I25810 Atherosclerosis of coronary artery bypass graft(s) without angina pectoris: Secondary | ICD-10-CM

## 2023-12-15 DIAGNOSIS — I429 Cardiomyopathy, unspecified: Secondary | ICD-10-CM

## 2023-12-15 DIAGNOSIS — I480 Paroxysmal atrial fibrillation: Secondary | ICD-10-CM | POA: Diagnosis not present

## 2023-12-15 DIAGNOSIS — I5022 Chronic systolic (congestive) heart failure: Secondary | ICD-10-CM

## 2023-12-15 NOTE — Progress Notes (Signed)
 Cardiology Office Note   Date:  12/15/2023   ID:  Matthew [name], DOB Aug 17, 1930, MRN [id number]  PCP:  Albina GORMAN Dine, MD  Cardiologist:  Denyse Bathe, MD      History of Present Illness: Matthew [name] is a 89 y.o. male who presents for No chief complaint on file.   Has SOB unchanged.      Past Medical History:  Diagnosis Date   Anginal pain (HCC)    Arthritis    Ascending aortic aneurysm (HCC) 07/18/2015   a.) TTE 07/18/2015: severe dilation of ascending aorta measuring 7.0 cm. b.) TTE 01/10/2018: aortic root 3.8 cm; ascending aorta 6.8 cm; refused surgical intervention/repair.   Atrial fibrillation (HCC)    a.) CHA2DS2-VASc = 6 (age x 2, HFrEF, HTN, previous MI, T2DM). b.) rate/rhythm maintained on amiodarone  + metoprolol  succinate; chronic antiplatelet therapy using clopidogrel    Bradycardia    Coronary artery disease    ESRD (end stage renal disease) (HCC)    GERD (gastroesophageal reflux disease)    HFrEF (heart failure with reduced ejection fraction) (HCC)    a.) TTE 12/03/2013: mod dec LV function; EF 35-40%; severe inferolateral and inferior HK; LA dilated; G3DD; PASP 52 mmHg. b.) TTE 07/18/2015: mild LV dysfunction with mild LVH; EF 45%; mild BAE. 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 c.) TTE 01/10/2018: mod AR/TR     Past  Surgical History:  Procedure Laterality Date   A/V FISTULAGRAM Left 02/09/2023   Procedure: A/V Fistulagram;  Surgeon: Marea Selinda GORMAN, MD;  Location: [facility];  Service: Cardiovascular;  Laterality: Left;   A/V SHUNT INTERVENTION N/A 01/25/2023   Procedure: A/V SHUNT INTERVENTION;  Surgeon: Jama Cordella MATSU, MD;  Location: [facility];  Service: Cardiovascular;  Laterality: N/A;   A/V SHUNTOGRAM Left 04/01/2022   Procedure: A/V SHUNTOGRAM;  Surgeon: Marea Selinda GORMAN, MD;  Location: [facility];  Service: Cardiovascular;  Laterality: Left;   AV FISTULA PLACEMENT Left 04/15/2021   Procedure: INSERTION OF ARTERIOVENOUS (AV) GORE-TEX GRAFT ARM ( BRACHIAL AXILLARY);  Surgeon: Marea Selinda GORMAN, MD;  Location: [facility];  Service: Vascular;  Laterality: Left;   CATARACT EXTRACTION W/PHACO Left 07/26/2017   Procedure: CATARACT EXTRACTION PHACO AND INTRAOCULAR LENS PLACEMENT (IOC);  Surgeon: Jaye Fallow, MD;  Location: [facility];  Service: Ophthalmology;  Laterality: Left;  US    00:45.8 AP%  13.1 CDE  6.00 Fluid Pack Lot # [id number]   CATARACT EXTRACTION W/PHACO Right 08/17/2017   Procedure: CATARACT EXTRACTION PHACO AND INTRAOCULAR LENS PLACEMENT (IOC);  Surgeon: Jaye Fallow, MD;  Location: [facility];  Service: Ophthalmology;  Laterality: Right;  US   01:30 AP% 16.8 CDE 15.24 Fluid pack lot # [id number]   CHOLECYSTECTOMY     CORONARY ARTERY BYPASS  GRAFT N/A 2001   Procedure: 5v CABG (LIMA-LAD, SVG-D1, SVG-OM1, SVG-OM2, SVG-PDA)   DIALYSIS/PERMA CATHETER INSERTION N/A 02/09/2018   Procedure: DIALYSIS/PERMA CATHETER INSERTION;  Surgeon: Marea Selinda RAMAN, MD;  Location: [facility];  Service: Cardiovascular;  Laterality: N/A;   DIALYSIS/PERMA CATHETER INSERTION N/A 02/09/2023   Procedure: DIALYSIS/PERMA CATHETER INSERTION;  Surgeon: Marea Selinda RAMAN, MD;  Location: [facility];  Service: Cardiovascular;  Laterality: N/A;   DIALYSIS/PERMA CATHETER INSERTION N/A 11/28/2023    Procedure: DIALYSIS/PERMA CATHETER INSERTION;  Surgeon: Marea Selinda RAMAN, MD;  Location: [facility];  Service: Cardiovascular;  Laterality: N/A;   DIALYSIS/PERMA CATHETER REMOVAL N/A 06/25/2021   Procedure: DIALYSIS/PERMA CATHETER REMOVAL;  Surgeon: Marea Selinda RAMAN, MD;  Location: [facility];  Service: Cardiovascular;  Laterality: N/A;   FRACTURE SURGERY Left 2015   LEFT arm   INSERTION OF DIALYSIS CATHETER Right 11/03/2020   Procedure: INSERTION OF Perm Cath in the RIGHT INTERNAL JUGULAR;  Surgeon: Marea Selinda RAMAN, MD;  Location: [facility];  Service: Vascular;  Laterality: Right;   INTRAMEDULLARY (IM) NAIL INTERTROCHANTERIC Right 11/12/2022   Procedure: INTRAMEDULLARY (IM) NAIL INTERTROCHANTERIC;  Surgeon: Marchia Drivers, MD;  Location: [facility];  Service: Orthopedics;  Laterality: Right;   LEFT HEART CATHETERIZATION WITH CORONARY/GRAFT ANGIOGRAM Left 12/04/2013   Procedure: LEFT HEART CATHETERIZATION WITH EL BILE;  Surgeon: Alm LELON Clay, MD;  Location: [facility];  Service: Cardiovascular;  Laterality: Left;   LEFT HEART CATHETERIZATION WITH CORONARY/GRAFT ANGIOGRAM Left 02/06/2009   Procedure: LEFT HEART CATHETERIZATION WITH CORONARY/GRAFT ANGIOGRAM; Location: [facility]; Surgeon: Vita Bathe, MD   PERCUTANEOUS CORONARY STENT INTERVENTION (PCI-S) N/A 12/06/2013   Procedure: STAGED PERCUTANEOUS CORONARY STENT INTERVENTION (overlapping 4.0 x 38 mm (mid) and 4.0 x 28 mm (ostial) Promus Primier DES to SVG-RPDA graft);  Surgeon: Victory LELON Claudene DOUGLAS, MD;  Location: [facility];  Service: Cardiovascular   PERIPHERAL VASCULAR THROMBECTOMY Left 08/31/2023   Procedure: PERIPHERAL VASCULAR THROMBECTOMY;  Surgeon: Jama Cordella MATSU, MD;  Location: [facility];  Service: Cardiovascular;  Laterality: Left;   REMOVAL OF A DIALYSIS CATHETER N/A 11/03/2020   Procedure: REMOVAL OF A DIALYSIS CATHETER;  Surgeon: Marea Selinda RAMAN, MD;  Location: [facility];  Service: Vascular;   Laterality: N/A;   TEMPORARY DIALYSIS CATHETER N/A 02/04/2023   Procedure: TEMPORARY DIALYSIS CATHETER;  Surgeon: Jama Cordella MATSU, MD;  Location: [facility];  Service: Cardiovascular;  Laterality: N/A;   TOTAL HIP REVISION Right 01/12/2023   Procedure: CONVERSION TO  RIGHT TOTAL HIP REPLACEMENT;  Surgeon: Edna Toribio LABOR, MD;  Location: [facility];  Service: Orthopedics;  Laterality: Right;     Current Outpatient Medications  Medication Sig Dispense Refill   aspirin  EC 81 MG tablet Take 81 mg by mouth daily. Swallow whole.     calcium  carbonate (TUMS EX) 750 MG chewable tablet Chew 1 tablet by mouth 3 (three) times daily.     cetirizine (ZYRTEC) 10 MG tablet Take 10 mg by mouth daily.     cholecalciferol  (VITAMIN D ) 25 MCG (1000 UNIT) tablet Take 1,000 Units by mouth daily with breakfast.     clopidogrel  (PLAVIX ) 75 MG tablet TAKE 1 TABLET BY MOUTH DAILY 90 tablet 3   ferrous sulfate  325 (65 FE) MG tablet Take 325 mg by mouth 2 (two) times daily with a meal.      fluticasone  (FLONASE ) 50 MCG/ACT nasal spray Place 2 sprays into both nostrils daily as needed for allergies or rhinitis.  furosemide  (LASIX ) 20 MG tablet Take 1 tablet (20 mg total) by mouth daily. 30 tablet 11   glipiZIDE  (GLUCOTROL ) 5 MG tablet Take 2.5 mg by mouth daily before breakfast.     levothyroxine  (SYNTHROID ) 100 MCG tablet TAKE 1 TABLET BY MOUTH IN THE  MORNING 90 tablet 3   lidocaine -prilocaine  (EMLA ) cream Apply 1 Application topically as needed (before dialysis).     midodrine  (PROAMATINE ) 10 MG tablet See administration instructions. Take 10 mg by mouth 1 hour before dialysis and 30 minutes before procedure ends.     multivitamin (RENA-VIT) TABS tablet Take 1 tablet by mouth daily.     nitroGLYCERIN  (NITROSTAT ) 0.4 MG SL tablet DISSOLVE 1 TABLET UNDER THE  TONGUE EVERY 5 MINUTES AS NEEDED FOR CHEST PAIN. MAX OF 3 TABLETS IN 15 MINUTES. CALL 911 IF PAIN  PERSISTS. 100 tablet 3   Omega-3 Fatty Acids (FISH  OIL) 1000 MG CAPS Take 4,000 mg by mouth daily with lunch.     polyethylene glycol (MIRALAX  / GLYCOLAX ) 17 g packet Take 17 g by mouth daily. 14 each 0   potassium chloride  SA (KLOR-CON  M) 20 MEQ tablet Take 20 mEq by mouth daily.     rosuvastatin  (CRESTOR ) 40 MG tablet Take 1 tablet (40 mg total) by mouth daily. 90 tablet 3   senna-docusate (SENOKOT-S) 8.6-50 MG tablet Take 2 tablets by mouth 2 (two) times daily. 30 tablet 0   Continuous Glucose Receiver (FREESTYLE LIBRE 3 READER) DEVI 1 Device by Does not apply route daily. (Patient not taking: Reported on 12/15/2023) 1 each 0   Continuous Glucose Sensor (FREESTYLE LIBRE 3 SENSOR) MISC 1 patch by Does not apply route as directed. Place 1 sensor on the skin every 14 days. Use to check glucose continuously (Patient not taking: Reported on 12/15/2023) 2 each 11   levocetirizine (XYZAL ) 5 MG tablet Take 10 mg by mouth every evening. (Patient not taking: Reported on 12/15/2023)     midodrine  (PROAMATINE ) 5 MG tablet Take 1 tablet (5 mg total) by mouth 3 (three) times daily with meals. (Patient not taking: Reported on 12/15/2023)     traZODone  (DESYREL ) 50 MG tablet Take 50 mg by mouth at bedtime as needed for sleep (1/2 Tablet as needed). (Patient not taking: Reported on 12/15/2023)     No current facility-administered medications for this visit.    Allergies:   Amoxicillin    Social History:   reports that he quit smoking about 33 years ago. His smoking use included pipe and cigarettes. He has never used smokeless tobacco. He reports that he does not currently use alcohol after a past usage of about 1.0 standard drink of alcohol per week. He reports that he does not use drugs.   Family History:  family history includes Heart attack (age of onset: 68) in his father; Heart failure in his mother.    ROS:     Review of Systems  Constitutional: Negative.   HENT: Negative.    Eyes: Negative.   Respiratory: Negative.    Gastrointestinal: Negative.    Genitourinary: Negative.   Musculoskeletal: Negative.   Skin: Negative.   Neurological: Negative.   Endo/Heme/Allergies: Negative.   Psychiatric/Behavioral: Negative.    All other systems reviewed and are negative.     All other systems are reviewed and negative.    PHYSICAL EXAM: VS:  BP 104/60   Pulse 89   Ht 6' (1.829 m)   Wt 192 lb 6.4 oz (87.3 kg)   SpO2  96%   BMI 26.09 kg/m  , BMI Body mass index is 26.09 kg/m. Last weight:  Wt Readings from Last 3 Encounters:  12/15/23 192 lb 6.4 oz (87.3 kg)  11/28/23 196 lb (88.9 kg)  11/25/23 196 lb (88.9 kg)     Physical Exam Vitals reviewed.  Constitutional:      Appearance: Normal appearance. He is normal weight.  HENT:     Head: Normocephalic.     Nose: Nose normal.     Mouth/Throat:     Mouth: Mucous membranes are moist.  Eyes:     Pupils: Pupils are equal, round, and reactive to light.  Cardiovascular:     Rate and Rhythm: Normal rate and regular rhythm.     Pulses: Normal pulses.     Heart sounds: Normal heart sounds.  Pulmonary:     Effort: Pulmonary effort is normal.  Abdominal:     General: Abdomen is flat. Bowel sounds are normal.  Musculoskeletal:        General: Normal range of motion.     Cervical back: Normal range of motion.  Skin:    General: Skin is warm.  Neurological:     General: No focal deficit present.     Mental Status: He is alert.  Psychiatric:        Mood and Affect: Mood normal.       EKG:   Recent Labs: 01/10/2023: Magnesium  1.9 06/30/2023: TSH 3.470 11/25/2023: ALT 24; BUN 70; Creatinine, Ser 3.33; Hemoglobin 11.8; Platelets 102; Potassium 4.8; Sodium 137    Lipid Panel    Component Value Date/Time   CHOL 122 09/29/2023 0959   TRIG 53 09/29/2023 0959   HDL 65 09/29/2023 0959   CHOLHDL 1.9 09/29/2023 0959   LDLCALC 45 09/29/2023 0959      Other studies Reviewed: Additional studies/ records that were reviewed today include:  Review of the above records  demonstrates:       No data to display            ASSESSMENT AND PLAN:  No diagnosis found.   Problem List Items Addressed This Visit   None      Disposition:   No follow-ups on file.    Total time spent: 30 minutes  Signed,  Denyse Bathe, MD  12/15/2023 11:19 AM    [facility]

## 2023-12-16 DIAGNOSIS — N186 End stage renal disease: Secondary | ICD-10-CM | POA: Diagnosis not present

## 2023-12-16 DIAGNOSIS — Z992 Dependence on renal dialysis: Secondary | ICD-10-CM | POA: Diagnosis not present

## 2023-12-16 DIAGNOSIS — D509 Iron deficiency anemia, unspecified: Secondary | ICD-10-CM | POA: Diagnosis not present

## 2023-12-16 DIAGNOSIS — N250 Renal osteodystrophy: Secondary | ICD-10-CM | POA: Diagnosis not present

## 2023-12-16 DIAGNOSIS — D631 Anemia in chronic kidney disease: Secondary | ICD-10-CM | POA: Diagnosis not present

## 2023-12-20 DIAGNOSIS — D631 Anemia in chronic kidney disease: Secondary | ICD-10-CM | POA: Diagnosis not present

## 2023-12-20 DIAGNOSIS — N250 Renal osteodystrophy: Secondary | ICD-10-CM | POA: Diagnosis not present

## 2023-12-20 DIAGNOSIS — Z992 Dependence on renal dialysis: Secondary | ICD-10-CM | POA: Diagnosis not present

## 2023-12-20 DIAGNOSIS — N186 End stage renal disease: Secondary | ICD-10-CM | POA: Diagnosis not present

## 2023-12-20 DIAGNOSIS — D509 Iron deficiency anemia, unspecified: Secondary | ICD-10-CM | POA: Diagnosis not present

## 2023-12-21 DIAGNOSIS — N250 Renal osteodystrophy: Secondary | ICD-10-CM | POA: Diagnosis not present

## 2023-12-21 DIAGNOSIS — N186 End stage renal disease: Secondary | ICD-10-CM | POA: Diagnosis not present

## 2023-12-21 DIAGNOSIS — D631 Anemia in chronic kidney disease: Secondary | ICD-10-CM | POA: Diagnosis not present

## 2023-12-21 DIAGNOSIS — D509 Iron deficiency anemia, unspecified: Secondary | ICD-10-CM | POA: Diagnosis not present

## 2023-12-21 DIAGNOSIS — Z992 Dependence on renal dialysis: Secondary | ICD-10-CM | POA: Diagnosis not present

## 2023-12-22 DIAGNOSIS — Z992 Dependence on renal dialysis: Secondary | ICD-10-CM | POA: Diagnosis not present

## 2023-12-22 DIAGNOSIS — N186 End stage renal disease: Secondary | ICD-10-CM | POA: Diagnosis not present

## 2023-12-23 DIAGNOSIS — Z992 Dependence on renal dialysis: Secondary | ICD-10-CM | POA: Diagnosis not present

## 2023-12-23 DIAGNOSIS — N250 Renal osteodystrophy: Secondary | ICD-10-CM | POA: Diagnosis not present

## 2023-12-23 DIAGNOSIS — D631 Anemia in chronic kidney disease: Secondary | ICD-10-CM | POA: Diagnosis not present

## 2023-12-23 DIAGNOSIS — Z23 Encounter for immunization: Secondary | ICD-10-CM | POA: Diagnosis not present

## 2023-12-23 DIAGNOSIS — N186 End stage renal disease: Secondary | ICD-10-CM | POA: Diagnosis not present

## 2023-12-23 DIAGNOSIS — D509 Iron deficiency anemia, unspecified: Secondary | ICD-10-CM | POA: Diagnosis not present

## 2023-12-26 DIAGNOSIS — N186 End stage renal disease: Secondary | ICD-10-CM | POA: Diagnosis not present

## 2023-12-26 DIAGNOSIS — N250 Renal osteodystrophy: Secondary | ICD-10-CM | POA: Diagnosis not present

## 2023-12-26 DIAGNOSIS — D509 Iron deficiency anemia, unspecified: Secondary | ICD-10-CM | POA: Diagnosis not present

## 2023-12-26 DIAGNOSIS — Z23 Encounter for immunization: Secondary | ICD-10-CM | POA: Diagnosis not present

## 2023-12-26 DIAGNOSIS — Z992 Dependence on renal dialysis: Secondary | ICD-10-CM | POA: Diagnosis not present

## 2023-12-26 DIAGNOSIS — D631 Anemia in chronic kidney disease: Secondary | ICD-10-CM | POA: Diagnosis not present

## 2023-12-28 DIAGNOSIS — D631 Anemia in chronic kidney disease: Secondary | ICD-10-CM | POA: Diagnosis not present

## 2023-12-28 DIAGNOSIS — N186 End stage renal disease: Secondary | ICD-10-CM | POA: Diagnosis not present

## 2023-12-28 DIAGNOSIS — N250 Renal osteodystrophy: Secondary | ICD-10-CM | POA: Diagnosis not present

## 2023-12-28 DIAGNOSIS — D509 Iron deficiency anemia, unspecified: Secondary | ICD-10-CM | POA: Diagnosis not present

## 2023-12-28 DIAGNOSIS — Z23 Encounter for immunization: Secondary | ICD-10-CM | POA: Diagnosis not present

## 2023-12-28 DIAGNOSIS — Z992 Dependence on renal dialysis: Secondary | ICD-10-CM | POA: Diagnosis not present

## 2023-12-30 DIAGNOSIS — D509 Iron deficiency anemia, unspecified: Secondary | ICD-10-CM | POA: Diagnosis not present

## 2023-12-30 DIAGNOSIS — N250 Renal osteodystrophy: Secondary | ICD-10-CM | POA: Diagnosis not present

## 2023-12-30 DIAGNOSIS — D631 Anemia in chronic kidney disease: Secondary | ICD-10-CM | POA: Diagnosis not present

## 2023-12-30 DIAGNOSIS — Z23 Encounter for immunization: Secondary | ICD-10-CM | POA: Diagnosis not present

## 2023-12-30 DIAGNOSIS — N186 End stage renal disease: Secondary | ICD-10-CM | POA: Diagnosis not present

## 2023-12-30 DIAGNOSIS — Z992 Dependence on renal dialysis: Secondary | ICD-10-CM | POA: Diagnosis not present

## 2024-01-02 DIAGNOSIS — N186 End stage renal disease: Secondary | ICD-10-CM | POA: Diagnosis not present

## 2024-01-02 DIAGNOSIS — D509 Iron deficiency anemia, unspecified: Secondary | ICD-10-CM | POA: Diagnosis not present

## 2024-01-02 DIAGNOSIS — D631 Anemia in chronic kidney disease: Secondary | ICD-10-CM | POA: Diagnosis not present

## 2024-01-02 DIAGNOSIS — N250 Renal osteodystrophy: Secondary | ICD-10-CM | POA: Diagnosis not present

## 2024-01-02 DIAGNOSIS — Z23 Encounter for immunization: Secondary | ICD-10-CM | POA: Diagnosis not present

## 2024-01-02 DIAGNOSIS — Z992 Dependence on renal dialysis: Secondary | ICD-10-CM | POA: Diagnosis not present

## 2024-01-04 DIAGNOSIS — J029 Acute pharyngitis, unspecified: Secondary | ICD-10-CM | POA: Diagnosis not present

## 2024-01-04 DIAGNOSIS — D509 Iron deficiency anemia, unspecified: Secondary | ICD-10-CM | POA: Diagnosis not present

## 2024-01-04 DIAGNOSIS — N250 Renal osteodystrophy: Secondary | ICD-10-CM | POA: Diagnosis not present

## 2024-01-04 DIAGNOSIS — R590 Localized enlarged lymph nodes: Secondary | ICD-10-CM | POA: Diagnosis not present

## 2024-01-04 DIAGNOSIS — N186 End stage renal disease: Secondary | ICD-10-CM | POA: Diagnosis not present

## 2024-01-04 DIAGNOSIS — Z03818 Encounter for observation for suspected exposure to other biological agents ruled out: Secondary | ICD-10-CM | POA: Diagnosis not present

## 2024-01-04 DIAGNOSIS — Z23 Encounter for immunization: Secondary | ICD-10-CM | POA: Diagnosis not present

## 2024-01-04 DIAGNOSIS — D631 Anemia in chronic kidney disease: Secondary | ICD-10-CM | POA: Diagnosis not present

## 2024-01-04 DIAGNOSIS — Z992 Dependence on renal dialysis: Secondary | ICD-10-CM | POA: Diagnosis not present

## 2024-01-05 ENCOUNTER — Encounter (INDEPENDENT_AMBULATORY_CARE_PROVIDER_SITE_OTHER)

## 2024-01-05 ENCOUNTER — Ambulatory Visit (INDEPENDENT_AMBULATORY_CARE_PROVIDER_SITE_OTHER): Admitting: Vascular Surgery

## 2024-01-06 DIAGNOSIS — N186 End stage renal disease: Secondary | ICD-10-CM | POA: Diagnosis not present

## 2024-01-06 DIAGNOSIS — D631 Anemia in chronic kidney disease: Secondary | ICD-10-CM | POA: Diagnosis not present

## 2024-01-06 DIAGNOSIS — N250 Renal osteodystrophy: Secondary | ICD-10-CM | POA: Diagnosis not present

## 2024-01-06 DIAGNOSIS — D509 Iron deficiency anemia, unspecified: Secondary | ICD-10-CM | POA: Diagnosis not present

## 2024-01-06 DIAGNOSIS — Z23 Encounter for immunization: Secondary | ICD-10-CM | POA: Diagnosis not present

## 2024-01-06 DIAGNOSIS — Z992 Dependence on renal dialysis: Secondary | ICD-10-CM | POA: Diagnosis not present

## 2024-01-09 ENCOUNTER — Other Ambulatory Visit

## 2024-01-09 DIAGNOSIS — I120 Hypertensive chronic kidney disease with stage 5 chronic kidney disease or end stage renal disease: Secondary | ICD-10-CM | POA: Diagnosis not present

## 2024-01-09 DIAGNOSIS — N186 End stage renal disease: Secondary | ICD-10-CM | POA: Diagnosis not present

## 2024-01-09 DIAGNOSIS — E1122 Type 2 diabetes mellitus with diabetic chronic kidney disease: Secondary | ICD-10-CM | POA: Diagnosis not present

## 2024-01-09 DIAGNOSIS — Z992 Dependence on renal dialysis: Secondary | ICD-10-CM | POA: Diagnosis not present

## 2024-01-10 ENCOUNTER — Encounter: Payer: Self-pay | Admitting: Internal Medicine

## 2024-01-10 ENCOUNTER — Ambulatory Visit (INDEPENDENT_AMBULATORY_CARE_PROVIDER_SITE_OTHER): Admitting: Internal Medicine

## 2024-01-10 VITALS — BP 110/54 | HR 99 | Temp 96.3°F | Ht 70.0 in | Wt 192.2 lb

## 2024-01-10 DIAGNOSIS — Z992 Dependence on renal dialysis: Secondary | ICD-10-CM

## 2024-01-10 DIAGNOSIS — E782 Mixed hyperlipidemia: Secondary | ICD-10-CM | POA: Diagnosis not present

## 2024-01-10 DIAGNOSIS — I120 Hypertensive chronic kidney disease with stage 5 chronic kidney disease or end stage renal disease: Secondary | ICD-10-CM

## 2024-01-10 DIAGNOSIS — E1122 Type 2 diabetes mellitus with diabetic chronic kidney disease: Secondary | ICD-10-CM

## 2024-01-10 DIAGNOSIS — N186 End stage renal disease: Secondary | ICD-10-CM | POA: Diagnosis not present

## 2024-01-10 LAB — POCT CBG (FASTING - GLUCOSE)-MANUAL ENTRY: Glucose Fasting, POC: 99 mg/dL (ref 70–99)

## 2024-01-10 LAB — CK: Total CK: 20 U/L — ABNORMAL LOW (ref 30–208)

## 2024-01-10 NOTE — Progress Notes (Signed)
 Established Patient Office Visit  Subjective:  Patient ID: Matthew [name], male    DOB: 22-Mar-1931  Age: 89 y.o. MRN: [id number]  No chief complaint on file.   No new complaints, recovering from a URI and also here for lab review and medication refills. Labs reviewed and notable for normal CK, A1c not done, lipids at target in May. Denies any hypoglycemic episodes and home bg readings have been at target.     No other concerns at this time.   Past Medical History:  Diagnosis Date   Anginal pain (HCC)    Arthritis    Ascending aortic aneurysm (HCC) 07/18/2015   a.) TTE 07/18/2015: severe dilation of ascending aorta measuring 7.0 cm. b.) TTE 01/10/2018: aortic root 3.8 cm; ascending aorta 6.8 cm; refused surgical intervention/repair.   Atrial fibrillation (HCC)    a.) CHA2DS2-VASc = 6 (age x 2, HFrEF, HTN, previous MI, T2DM). b.) rate/rhythm maintained on amiodarone  + metoprolol  succinate; chronic antiplatelet therapy using clopidogrel    Bradycardia    Coronary artery disease    ESRD (end stage renal disease) (HCC)    GERD (gastroesophageal reflux disease)    HFrEF (heart failure with reduced ejection fraction) (HCC)    a.) TTE 12/03/2013: mod dec LV function; EF 35-40%; severe inferolateral and inferior HK; LA dilated; G3DD; PASP 52 mmHg. b.) TTE 07/18/2015: mild LV dysfunction with mild LVH; EF 45%; mild BAE. c.) TTE 01/10/2018: mod LV dysfunction; EF 45%; diffuse HK   HLD (hyperlipidemia)    Hypertension    IDA (iron deficiency anemia)    Long term current use of antithrombotics/antiplatelets    a.) clopidogrel    Myocardial infarction (HCC) 2000   Pneumonia    Handsome Anglin/P CABG x 4 2001   a.) LIMA-LAD, SVG-D1, SVG-OM1, SVG-OM2, SVG-PDA   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   left frontal scalp, EDC   Squamous cell carcinoma of skin 05/05/2020   left temporal scalp, in situ, EDC 05/13/20   T2DM (type 2 diabetes mellitus) (HCC)    Valvular insufficiency 12/03/2013   a.) TTE  12/03/2013: EF 35-40%; mild AR/MR, b.) TTE 07/18/2015: EF 45%; mild AR/PR, mod TR, severe MR. c.) TTE 01/10/2018: mod AR/TR    Past Surgical History:  Procedure Laterality Date   A/V FISTULAGRAM Left 02/09/2023   Procedure: A/V Fistulagram;  Surgeon: Marea Selinda RAMAN, MD;  Location: [facility];  Service: Cardiovascular;  Laterality: Left;   A/V SHUNT INTERVENTION N/A 01/25/2023   Procedure: A/V SHUNT INTERVENTION;  Surgeon: Jama Cordella MATSU, MD;  Location: [facility];  Service: Cardiovascular;  Laterality: N/A;   A/V SHUNTOGRAM Left 04/01/2022   Procedure: A/V SHUNTOGRAM;  Surgeon: Marea Selinda RAMAN, MD;  Location: [facility];  Service: Cardiovascular;  Laterality: Left;   AV FISTULA PLACEMENT Left 04/15/2021   Procedure: INSERTION OF ARTERIOVENOUS (AV) GORE-TEX GRAFT ARM ( BRACHIAL AXILLARY);  Surgeon: Marea Selinda RAMAN, MD;  Location: [facility];  Service: Vascular;  Laterality: Left;   CATARACT EXTRACTION W/PHACO Left 07/26/2017   Procedure: CATARACT EXTRACTION PHACO AND INTRAOCULAR LENS PLACEMENT (IOC);  Surgeon: Jaye Fallow, MD;  Location: [facility];  Service: Ophthalmology;  Laterality: Left;  US    00:45.8 AP%  13.1 CDE  6.00 Fluid Pack Lot # [id number]   CATARACT EXTRACTION W/PHACO Right 08/17/2017   Procedure: CATARACT EXTRACTION PHACO AND INTRAOCULAR LENS PLACEMENT (IOC);  Surgeon: Jaye Fallow, MD;  Location: [facility];  Service: Ophthalmology;  Laterality: Right;  US   01:30 AP%  16.8 CDE 15.24 Fluid pack lot # [id number]   CHOLECYSTECTOMY     CORONARY ARTERY BYPASS GRAFT N/A 2001   Procedure: 5v CABG (LIMA-LAD, SVG-D1, SVG-OM1, SVG-OM2, SVG-PDA)   DIALYSIS/PERMA CATHETER INSERTION N/A 02/09/2018   Procedure: DIALYSIS/PERMA CATHETER INSERTION;  Surgeon: Marea Selinda RAMAN, MD;  Location: [facility];  Service: Cardiovascular;  Laterality: N/A;   DIALYSIS/PERMA CATHETER INSERTION N/A 02/09/2023   Procedure: DIALYSIS/PERMA CATHETER INSERTION;  Surgeon: Marea Selinda RAMAN, MD;  Location: [facility];  Service: Cardiovascular;  Laterality: N/A;   DIALYSIS/PERMA CATHETER INSERTION N/A 11/28/2023   Procedure: DIALYSIS/PERMA CATHETER INSERTION;  Surgeon: Marea Selinda RAMAN, MD;  Location: [facility];  Service: Cardiovascular;  Laterality: N/A;   DIALYSIS/PERMA CATHETER REMOVAL N/A 06/25/2021   Procedure: DIALYSIS/PERMA CATHETER REMOVAL;  Surgeon: Marea Selinda RAMAN, MD;  Location: [facility];  Service: Cardiovascular;  Laterality: N/A;   FRACTURE SURGERY Left 2015   LEFT arm   INSERTION OF DIALYSIS CATHETER Right 11/03/2020   Procedure: INSERTION OF Perm Cath in the RIGHT INTERNAL JUGULAR;  Surgeon: Marea Selinda RAMAN, MD;  Location: [facility];  Service: Vascular;  Laterality: Right;   INTRAMEDULLARY (IM) NAIL INTERTROCHANTERIC Right 11/12/2022   Procedure: INTRAMEDULLARY (IM) NAIL INTERTROCHANTERIC;  Surgeon: Marchia Drivers, MD;  Location: [facility];  Service: Orthopedics;  Laterality: Right;   LEFT HEART CATHETERIZATION WITH CORONARY/GRAFT ANGIOGRAM Left 12/04/2013   Procedure: LEFT HEART CATHETERIZATION WITH EL BILE;  Surgeon: Alm LELON Clay, MD;  Location: [facility] LAB;  Service: Cardiovascular;  Laterality: Left;   LEFT HEART CATHETERIZATION WITH CORONARY/GRAFT ANGIOGRAM Left 02/06/2009   Procedure: LEFT HEART CATHETERIZATION WITH CORONARY/GRAFT ANGIOGRAM; Location: [facility]; Surgeon: Vita Bathe, MD   PERCUTANEOUS CORONARY STENT INTERVENTION (PCI-Jennalee Greaves) N/A 12/06/2013   Procedure: STAGED PERCUTANEOUS CORONARY STENT INTERVENTION (overlapping 4.0 x 38 mm (mid) and 4.0 x 28 mm (ostial) Promus Primier DES to SVG-RPDA graft);  Surgeon: Victory LELON Claudene DOUGLAS, MD;  Location: [facility] CATH LAB;  Service: Cardiovascular   PERIPHERAL VASCULAR THROMBECTOMY Left 08/31/2023   Procedure: PERIPHERAL VASCULAR THROMBECTOMY;  Surgeon: Jama Cordella MATSU, MD;  Location: [facility];  Service: Cardiovascular;  Laterality: Left;   REMOVAL OF A DIALYSIS CATHETER N/A  11/03/2020   Procedure: REMOVAL OF A DIALYSIS CATHETER;  Surgeon: Marea Selinda RAMAN, MD;  Location: [facility];  Service: Vascular;  Laterality: N/A;   TEMPORARY DIALYSIS CATHETER N/A 02/04/2023   Procedure: TEMPORARY DIALYSIS CATHETER;  Surgeon: Jama Cordella MATSU, MD;  Location: [facility];  Service: Cardiovascular;  Laterality: N/A;   TOTAL HIP REVISION Right 01/12/2023   Procedure: CONVERSION TO  RIGHT TOTAL HIP REPLACEMENT;  Surgeon: Edna Toribio LABOR, MD;  [facility];  Service: Orthopedics;  Laterality: Right;    Social History   Socioeconomic History   Marital status: Widowed    Spouse name: Not on file   Number of children: Not on file   Years of education: Not on file   Highest education level: Not on file  Occupational History   Not on file  Tobacco Use   Smoking status: Former    Current packs/day: 0.00    Types: Pipe, Cigarettes    Quit date: 25    Years since quitting: 33.6   Smokeless tobacco: Never  Vaping Use   Vaping status: Never Used  Substance and Sexual Activity   Alcohol use: Not Currently    Alcohol/week: 1.0 standard drink of alcohol    Types: 1 Cans of  beer per week    Comment: occasionally drinking only,once a month   Drug use: No   Sexual activity: Never  Other Topics Concern   Not on file  Social History Narrative   Lives with daughter and son in law   Social Drivers of Health   Financial Resource Strain: Low Risk  (01/04/2024)   Received from [facility]   Overall Financial Resource Strain (CARDIA)    Difficulty of Paying Living Expenses: Not hard at all  Food Insecurity: No Food Insecurity (01/04/2024)   [facility]   Hunger Vital Sign    Within the past 12 months, you worried that your food would run out before you got the money to buy more.: Never true    Within the past 12 months, the food you bought just didn't last and you didn't have money to get more.: Never true   Transportation Needs: No Transportation Needs (01/04/2024)   Received from [facility] - Transportation    In the past 12 months, has lack of transportation kept you from medical appointments or from getting medications?: No    Lack of Transportation (Non-Medical): No  Physical Activity: Inactive (01/10/2018)   Exercise Vital Sign    Days of Exercise per Week: 0 days    Minutes of Exercise per Session: 0 min  Stress: No Stress Concern Present (01/10/2018)   [employer] of Occupational Health - Occupational Stress Questionnaire    Feeling of Stress : Not at all  Social Connections: Socially Integrated (01/10/2018)   Social Connection and Isolation Panel    Frequency of Communication with Friends and Family: More than three times a week    Frequency of Social Gatherings with Friends and Family: More than three times a week    Attends Religious Services: More than 4 times per year    Active Member of [employer] or Organizations: Yes    Attends Engineer, structural: More than 4 times per year    Marital Status: Married  Catering manager Violence: Not At Risk (02/04/2023)   Humiliation, Afraid, Rape, and Kick questionnaire    Fear of Current or Ex-Partner: No    Emotionally Abused: No    Physically Abused: No    Sexually Abused: No    Family History  Problem Relation Age of Onset   Heart attack Father 78       died first MI at age 68   Heart failure Mother     Allergies  Allergen Reactions   Amoxicillin Diarrhea    Outpatient Medications Prior to Visit  Medication Sig   aspirin  EC 81 MG tablet Take 81 mg by mouth daily. Swallow whole. (Patient taking differently: Take 81 mg by mouth as needed. Swallow whole.)   calcium  carbonate (TUMS EX) 750 MG chewable tablet Chew 1 tablet by mouth 3 (three) times daily.   cetirizine (ZYRTEC) 10 MG tablet Take 10 mg by mouth daily.   cholecalciferol  (VITAMIN D ) 25 MCG (1000 UNIT) tablet Take 1,000 Units by  mouth daily with breakfast.   clopidogrel  (PLAVIX ) 75 MG tablet TAKE 1 TABLET BY MOUTH DAILY   ferrous sulfate  325 (65 FE) MG tablet Take 325 mg by mouth 2 (two) times daily with a meal.    fluticasone  (FLONASE ) 50 MCG/ACT nasal spray Place 2 sprays into both nostrils daily as needed for allergies or rhinitis.   furosemide  (LASIX ) 20 MG tablet Take 1 tablet (20 mg  total) by mouth daily.   glipiZIDE  (GLUCOTROL ) 5 MG tablet Take 2.5 mg by mouth daily before breakfast.   levothyroxine  (SYNTHROID ) 100 MCG tablet TAKE 1 TABLET BY MOUTH IN THE  MORNING   midodrine  (PROAMATINE ) 10 MG tablet See administration instructions. Take 10 mg by mouth 1 hour before dialysis and 30 minutes before procedure ends.   multivitamin (RENA-VIT) TABS tablet Take 1 tablet by mouth daily.   nitroGLYCERIN  (NITROSTAT ) 0.4 MG SL tablet DISSOLVE 1 TABLET UNDER THE  TONGUE EVERY 5 MINUTES AS NEEDED FOR CHEST PAIN. MAX OF 3 TABLETS IN 15 MINUTES. CALL 911 IF PAIN  PERSISTS.   Omega-3 Fatty Acids (FISH OIL ) 1000 MG CAPS Take 4,000 mg by mouth daily with lunch.   potassium chloride  SA (KLOR-CON  M) 20 MEQ tablet Take 20 mEq by mouth daily.   rosuvastatin  (CRESTOR ) 40 MG tablet Take 1 tablet (40 mg total) by mouth daily.   lidocaine -prilocaine  (EMLA ) cream Apply 1 Application topically as needed (before dialysis). (Patient not taking: Reported on 01/10/2024)   polyethylene glycol (MIRALAX  / GLYCOLAX ) 17 g packet Take 17 g by mouth daily. (Patient not taking: Reported on 01/10/2024)   senna-docusate (SENOKOT-Abdulkareem Badolato) 8.6-50 MG tablet Take 2 tablets by mouth 2 (two) times daily. (Patient not taking: Reported on 01/10/2024)   No facility-administered medications prior to visit.    ROS     Objective:   BP (!) 110/54   Pulse 99   Temp (!) 96.3 F (35.7 C)   Ht 5' 10 (1.778 m)   Wt 192 lb 3.2 oz (87.2 kg)   SpO2 99%   BMI 27.58 kg/m   Vitals:   01/10/24 1329  BP: (!) 110/54  Pulse: 99  Temp: (!) 96.3 F (35.7 C)  Height: 5'  10 (1.778 m)  Weight: 192 lb 3.2 oz (87.2 kg)  SpO2: 99%  BMI (Calculated): 27.58    Physical Exam   Results for orders placed or performed in visit on 01/10/24  POCT CBG (Fasting - Glucose)  Result Value Ref Range   Glucose Fasting, POC 99 70 - 99 mg/dL    Recent Results (from the past 2160 hours)  CBC with Differential     Status: Abnormal   Collection Time: 11/25/23  1:20 PM  Result Value Ref Range   WBC 5.5 4.0 - 10.5 K/uL   RBC 3.65 (L) 4.22 - 5.81 MIL/uL   Hemoglobin 11.8 (L) 13.0 - 17.0 g/dL   HCT 64.2 (L) 60.9 - 47.9 %   MCV 97.8 80.0 - 100.0 fL   MCH 32.3 26.0 - 34.0 pg   MCHC 33.1 30.0 - 36.0 g/dL   RDW 84.8 88.4 - 84.4 %   Platelets 102 (L) 150 - 400 K/uL   nRBC 0.0 0.0 - 0.2 %   Neutrophils Relative % 68 %   Neutro Abs 3.7 1.7 - 7.7 K/uL   Lymphocytes Relative 11 %   Lymphs Abs 0.6 (L) 0.7 - 4.0 K/uL   Monocytes Relative 11 %   Monocytes Absolute 0.6 0.1 - 1.0 K/uL   Eosinophils Relative 9 %   Eosinophils Absolute 0.5 0.0 - 0.5 K/uL   Basophils Relative 1 %   Basophils Absolute 0.1 0.0 - 0.1 K/uL   Immature Granulocytes 0 %   Abs Immature Granulocytes 0.02 0.00 - 0.07 K/uL    Comment: Performed at [facility], [address], [place], [place] [address]  Comprehensive metabolic panel     Status: Abnormal   Collection  Time: 11/25/23  1:20 PM  Result Value Ref Range   Sodium 137 135 - 145 mmol/L   Potassium 4.8 3.5 - 5.1 mmol/L   Chloride 102 98 - 111 mmol/L   CO2 24 22 - 32 mmol/L   Glucose, Bld 117 (H) 70 - 99 mg/dL    Comment: Glucose reference range applies only to samples taken after fasting for at least 8 hours.   BUN 70 (H) 8 - 23 mg/dL   Creatinine, Ser 6.66 (H) 0.61 - 1.24 mg/dL   Calcium  9.7 8.9 - 10.3 mg/dL   Total Protein 7.1 6.5 - 8.1 g/dL   Albumin  3.4 (L) 3.5 - 5.0 g/dL   AST 31 15 - 41 U/L   ALT 24 0 - 44 U/L   Alkaline Phosphatase 227 (H) 38 - 126 U/L   Total Bilirubin 0.9 0.0 - 1.2 mg/dL   GFR, Estimated 17 (L) >60  mL/min    Comment: (NOTE) Calculated using the CKD-EPI Creatinine Equation (2021)    Anion gap 11 5 - 15    Comment: Performed at [facility], [address] [place], [place] [address]  Hepatitis B surface antigen     Status: None   Collection Time: 11/25/23  5:20 PM  Result Value Ref Range   Hepatitis B Surface Ag NON REACTIVE NON REACTIVE    Comment: Performed at [facility], [address], [place], [place] [address]  Hepatitis B surface antibody,quantitative     Status: None   Collection Time: 11/25/23  5:20 PM  Result Value Ref Range   Hep B Mckennon Zwart AB Quant (Post) 16.4 Immunity>10 mIU/mL    Comment: (NOTE)  Status of Immunity                     Anti-HBs Level  ------------------                     -------------- Inconsistent with Immunity                  0.0 - 10.0 Consistent with Immunity                         >10.0 Performed At: [facility] [address] [place], [place] [id number] Jennette Shorter MD Ey:[id number]   Potassium [facility] vascular lab only)     Status: None   Collection Time: 11/28/23  9:37 AM  Result Value Ref Range   Potassium [facility] vascular lab) 4.8 3.5 - 5.1 mmol/L    Comment: Performed at [facility], [address] [place], [place] [address]  Glucose, capillary     Status: None   Collection Time: 11/28/23 10:09 AM  Result Value Ref Range   Glucose-Capillary 94 70 - 99 mg/dL    Comment: Glucose reference range applies only to samples taken after fasting for at least 8 hours.  CK     Status: Abnormal   Collection Time: 01/09/24  9:29 AM  Result Value Ref Range   Total CK 20 (L) 30 - 208 U/L  POCT CBG (Fasting - Glucose)     Status: None   Collection Time: 01/10/24  1:38 PM  Result Value Ref Range   Glucose Fasting, POC 99 70 - 99 mg/dL      Assessment & Plan:  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) -     POCT CBG (Fasting - Glucose) -  Hemoglobin A1c  Mixed hyperlipidemia -     Lipid  panel    Problem List Items Addressed This Visit       Cardiovascular and Mediastinum   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 [facility] - Primary   Relevant Orders   POCT CBG (Fasting - Glucose) (Completed)     Other   HLD (hyperlipidemia)    Return in about 3 months (around 04/11/2024) for fu with labs prior.   Total time spent: 20 minutes  Sherrill Cinderella Perry, MD  01/10/2024   This document may have been prepared by [facility] Voice Recognition software and as such may include unintentional dictation errors.

## 2024-01-11 DIAGNOSIS — N186 End stage renal disease: Secondary | ICD-10-CM | POA: Diagnosis not present

## 2024-01-11 DIAGNOSIS — Z992 Dependence on renal dialysis: Secondary | ICD-10-CM | POA: Diagnosis not present

## 2024-01-13 DIAGNOSIS — Z992 Dependence on renal dialysis: Secondary | ICD-10-CM | POA: Diagnosis not present

## 2024-01-13 DIAGNOSIS — D509 Iron deficiency anemia, unspecified: Secondary | ICD-10-CM | POA: Diagnosis not present

## 2024-01-13 DIAGNOSIS — N250 Renal osteodystrophy: Secondary | ICD-10-CM | POA: Diagnosis not present

## 2024-01-13 DIAGNOSIS — Z23 Encounter for immunization: Secondary | ICD-10-CM | POA: Diagnosis not present

## 2024-01-13 DIAGNOSIS — D631 Anemia in chronic kidney disease: Secondary | ICD-10-CM | POA: Diagnosis not present

## 2024-01-13 DIAGNOSIS — N186 End stage renal disease: Secondary | ICD-10-CM | POA: Diagnosis not present

## 2024-01-16 DIAGNOSIS — D509 Iron deficiency anemia, unspecified: Secondary | ICD-10-CM | POA: Diagnosis not present

## 2024-01-16 DIAGNOSIS — D631 Anemia in chronic kidney disease: Secondary | ICD-10-CM | POA: Diagnosis not present

## 2024-01-16 DIAGNOSIS — Z992 Dependence on renal dialysis: Secondary | ICD-10-CM | POA: Diagnosis not present

## 2024-01-16 DIAGNOSIS — Z23 Encounter for immunization: Secondary | ICD-10-CM | POA: Diagnosis not present

## 2024-01-16 DIAGNOSIS — N186 End stage renal disease: Secondary | ICD-10-CM | POA: Diagnosis not present

## 2024-01-16 DIAGNOSIS — N250 Renal osteodystrophy: Secondary | ICD-10-CM | POA: Diagnosis not present

## 2024-01-18 DIAGNOSIS — D509 Iron deficiency anemia, unspecified: Secondary | ICD-10-CM | POA: Diagnosis not present

## 2024-01-18 DIAGNOSIS — Z23 Encounter for immunization: Secondary | ICD-10-CM | POA: Diagnosis not present

## 2024-01-18 DIAGNOSIS — N186 End stage renal disease: Secondary | ICD-10-CM | POA: Diagnosis not present

## 2024-01-18 DIAGNOSIS — N250 Renal osteodystrophy: Secondary | ICD-10-CM | POA: Diagnosis not present

## 2024-01-18 DIAGNOSIS — D631 Anemia in chronic kidney disease: Secondary | ICD-10-CM | POA: Diagnosis not present

## 2024-01-18 DIAGNOSIS — Z992 Dependence on renal dialysis: Secondary | ICD-10-CM | POA: Diagnosis not present

## 2024-01-20 DIAGNOSIS — N250 Renal osteodystrophy: Secondary | ICD-10-CM | POA: Diagnosis not present

## 2024-01-20 DIAGNOSIS — D631 Anemia in chronic kidney disease: Secondary | ICD-10-CM | POA: Diagnosis not present

## 2024-01-20 DIAGNOSIS — N186 End stage renal disease: Secondary | ICD-10-CM | POA: Diagnosis not present

## 2024-01-20 DIAGNOSIS — Z992 Dependence on renal dialysis: Secondary | ICD-10-CM | POA: Diagnosis not present

## 2024-01-20 DIAGNOSIS — Z23 Encounter for immunization: Secondary | ICD-10-CM | POA: Diagnosis not present

## 2024-01-20 DIAGNOSIS — D509 Iron deficiency anemia, unspecified: Secondary | ICD-10-CM | POA: Diagnosis not present

## 2024-01-22 DIAGNOSIS — Z992 Dependence on renal dialysis: Secondary | ICD-10-CM | POA: Diagnosis not present

## 2024-01-22 DIAGNOSIS — N186 End stage renal disease: Secondary | ICD-10-CM | POA: Diagnosis not present

## 2024-01-23 DIAGNOSIS — Z992 Dependence on renal dialysis: Secondary | ICD-10-CM | POA: Diagnosis not present

## 2024-01-23 DIAGNOSIS — D631 Anemia in chronic kidney disease: Secondary | ICD-10-CM | POA: Diagnosis not present

## 2024-01-23 DIAGNOSIS — D509 Iron deficiency anemia, unspecified: Secondary | ICD-10-CM | POA: Diagnosis not present

## 2024-01-23 DIAGNOSIS — N186 End stage renal disease: Secondary | ICD-10-CM | POA: Diagnosis not present

## 2024-01-23 DIAGNOSIS — N250 Renal osteodystrophy: Secondary | ICD-10-CM | POA: Diagnosis not present

## 2024-01-25 DIAGNOSIS — N186 End stage renal disease: Secondary | ICD-10-CM | POA: Diagnosis not present

## 2024-01-25 DIAGNOSIS — D631 Anemia in chronic kidney disease: Secondary | ICD-10-CM | POA: Diagnosis not present

## 2024-01-25 DIAGNOSIS — N250 Renal osteodystrophy: Secondary | ICD-10-CM | POA: Diagnosis not present

## 2024-01-25 DIAGNOSIS — D509 Iron deficiency anemia, unspecified: Secondary | ICD-10-CM | POA: Diagnosis not present

## 2024-01-25 DIAGNOSIS — Z992 Dependence on renal dialysis: Secondary | ICD-10-CM | POA: Diagnosis not present

## 2024-01-27 DIAGNOSIS — N250 Renal osteodystrophy: Secondary | ICD-10-CM | POA: Diagnosis not present

## 2024-01-27 DIAGNOSIS — Z992 Dependence on renal dialysis: Secondary | ICD-10-CM | POA: Diagnosis not present

## 2024-01-27 DIAGNOSIS — N186 End stage renal disease: Secondary | ICD-10-CM | POA: Diagnosis not present

## 2024-01-27 DIAGNOSIS — D631 Anemia in chronic kidney disease: Secondary | ICD-10-CM | POA: Diagnosis not present

## 2024-01-27 DIAGNOSIS — D509 Iron deficiency anemia, unspecified: Secondary | ICD-10-CM | POA: Diagnosis not present

## 2024-01-30 DIAGNOSIS — D509 Iron deficiency anemia, unspecified: Secondary | ICD-10-CM | POA: Diagnosis not present

## 2024-01-30 DIAGNOSIS — N250 Renal osteodystrophy: Secondary | ICD-10-CM | POA: Diagnosis not present

## 2024-01-30 DIAGNOSIS — Z992 Dependence on renal dialysis: Secondary | ICD-10-CM | POA: Diagnosis not present

## 2024-01-30 DIAGNOSIS — N186 End stage renal disease: Secondary | ICD-10-CM | POA: Diagnosis not present

## 2024-01-30 DIAGNOSIS — D631 Anemia in chronic kidney disease: Secondary | ICD-10-CM | POA: Diagnosis not present

## 2024-02-03 DIAGNOSIS — D631 Anemia in chronic kidney disease: Secondary | ICD-10-CM | POA: Diagnosis not present

## 2024-02-03 DIAGNOSIS — Z992 Dependence on renal dialysis: Secondary | ICD-10-CM | POA: Diagnosis not present

## 2024-02-03 DIAGNOSIS — N250 Renal osteodystrophy: Secondary | ICD-10-CM | POA: Diagnosis not present

## 2024-02-03 DIAGNOSIS — D509 Iron deficiency anemia, unspecified: Secondary | ICD-10-CM | POA: Diagnosis not present

## 2024-02-03 DIAGNOSIS — N186 End stage renal disease: Secondary | ICD-10-CM | POA: Diagnosis not present

## 2024-02-06 DIAGNOSIS — N250 Renal osteodystrophy: Secondary | ICD-10-CM | POA: Diagnosis not present

## 2024-02-06 DIAGNOSIS — D631 Anemia in chronic kidney disease: Secondary | ICD-10-CM | POA: Diagnosis not present

## 2024-02-06 DIAGNOSIS — N186 End stage renal disease: Secondary | ICD-10-CM | POA: Diagnosis not present

## 2024-02-06 DIAGNOSIS — Z992 Dependence on renal dialysis: Secondary | ICD-10-CM | POA: Diagnosis not present

## 2024-02-06 DIAGNOSIS — D509 Iron deficiency anemia, unspecified: Secondary | ICD-10-CM | POA: Diagnosis not present

## 2024-02-08 DIAGNOSIS — D509 Iron deficiency anemia, unspecified: Secondary | ICD-10-CM | POA: Diagnosis not present

## 2024-02-08 DIAGNOSIS — Z992 Dependence on renal dialysis: Secondary | ICD-10-CM | POA: Diagnosis not present

## 2024-02-08 DIAGNOSIS — D631 Anemia in chronic kidney disease: Secondary | ICD-10-CM | POA: Diagnosis not present

## 2024-02-08 DIAGNOSIS — N186 End stage renal disease: Secondary | ICD-10-CM | POA: Diagnosis not present

## 2024-02-08 DIAGNOSIS — N250 Renal osteodystrophy: Secondary | ICD-10-CM | POA: Diagnosis not present

## 2024-02-10 DIAGNOSIS — D631 Anemia in chronic kidney disease: Secondary | ICD-10-CM | POA: Diagnosis not present

## 2024-02-10 DIAGNOSIS — N250 Renal osteodystrophy: Secondary | ICD-10-CM | POA: Diagnosis not present

## 2024-02-10 DIAGNOSIS — N186 End stage renal disease: Secondary | ICD-10-CM | POA: Diagnosis not present

## 2024-02-10 DIAGNOSIS — Z992 Dependence on renal dialysis: Secondary | ICD-10-CM | POA: Diagnosis not present

## 2024-02-10 DIAGNOSIS — D509 Iron deficiency anemia, unspecified: Secondary | ICD-10-CM | POA: Diagnosis not present

## 2024-02-13 DIAGNOSIS — Z992 Dependence on renal dialysis: Secondary | ICD-10-CM | POA: Diagnosis not present

## 2024-02-13 DIAGNOSIS — N186 End stage renal disease: Secondary | ICD-10-CM | POA: Diagnosis not present

## 2024-02-13 DIAGNOSIS — D509 Iron deficiency anemia, unspecified: Secondary | ICD-10-CM | POA: Diagnosis not present

## 2024-02-13 DIAGNOSIS — D631 Anemia in chronic kidney disease: Secondary | ICD-10-CM | POA: Diagnosis not present

## 2024-02-13 DIAGNOSIS — N250 Renal osteodystrophy: Secondary | ICD-10-CM | POA: Diagnosis not present

## 2024-02-15 DIAGNOSIS — N250 Renal osteodystrophy: Secondary | ICD-10-CM | POA: Diagnosis not present

## 2024-02-15 DIAGNOSIS — D509 Iron deficiency anemia, unspecified: Secondary | ICD-10-CM | POA: Diagnosis not present

## 2024-02-15 DIAGNOSIS — Z992 Dependence on renal dialysis: Secondary | ICD-10-CM | POA: Diagnosis not present

## 2024-02-15 DIAGNOSIS — N186 End stage renal disease: Secondary | ICD-10-CM | POA: Diagnosis not present

## 2024-02-15 DIAGNOSIS — D631 Anemia in chronic kidney disease: Secondary | ICD-10-CM | POA: Diagnosis not present

## 2024-02-16 ENCOUNTER — Ambulatory Visit (INDEPENDENT_AMBULATORY_CARE_PROVIDER_SITE_OTHER)

## 2024-02-16 ENCOUNTER — Encounter (INDEPENDENT_AMBULATORY_CARE_PROVIDER_SITE_OTHER): Payer: Self-pay | Admitting: Nurse Practitioner

## 2024-02-16 ENCOUNTER — Ambulatory Visit (INDEPENDENT_AMBULATORY_CARE_PROVIDER_SITE_OTHER): Admitting: Nurse Practitioner

## 2024-02-16 VITALS — BP 96/53 | HR 90 | Resp 18 | Ht 70.0 in | Wt 189.4 lb

## 2024-02-16 DIAGNOSIS — E1122 Type 2 diabetes mellitus with diabetic chronic kidney disease: Secondary | ICD-10-CM | POA: Diagnosis not present

## 2024-02-16 DIAGNOSIS — N186 End stage renal disease: Secondary | ICD-10-CM | POA: Diagnosis not present

## 2024-02-16 DIAGNOSIS — I120 Hypertensive chronic kidney disease with stage 5 chronic kidney disease or end stage renal disease: Secondary | ICD-10-CM | POA: Diagnosis not present

## 2024-02-16 DIAGNOSIS — Z992 Dependence on renal dialysis: Secondary | ICD-10-CM | POA: Diagnosis not present

## 2024-02-17 DIAGNOSIS — N250 Renal osteodystrophy: Secondary | ICD-10-CM | POA: Diagnosis not present

## 2024-02-17 DIAGNOSIS — D509 Iron deficiency anemia, unspecified: Secondary | ICD-10-CM | POA: Diagnosis not present

## 2024-02-17 DIAGNOSIS — N186 End stage renal disease: Secondary | ICD-10-CM | POA: Diagnosis not present

## 2024-02-17 DIAGNOSIS — D631 Anemia in chronic kidney disease: Secondary | ICD-10-CM | POA: Diagnosis not present

## 2024-02-17 DIAGNOSIS — Z992 Dependence on renal dialysis: Secondary | ICD-10-CM | POA: Diagnosis not present

## 2024-02-18 NOTE — Progress Notes (Signed)
 Subjective:    Patient ID: Matthew [name], male    DOB: [birth year], 89 y.o.   MRN: [id number] Chief Complaint  Patient presents with   Follow-up    Access placement needed    The patient presents today for follow-up of his dialysis access.  He underwent multiple declot's in a very short succession.  He does note that he has hypotensive episodes and is required to take midodrine  during dialysis due to his low blood pressure.  Finally after his last declot a PermCath was placed.  Today noninvasive studies show that his graft is essentially occluded.  His previous vein mapping shows that he does have small veins but will likely require placement of a graft in his right upper extremity.    Review of Systems  Neurological:  Positive for weakness.  All other systems reviewed and are negative.      Objective:   Physical Exam Vitals reviewed.  HENT:     Head: Normocephalic.  Cardiovascular:     Rate and Rhythm: Normal rate.  Pulmonary:     Effort: Pulmonary effort is normal.  Skin:    General: Skin is warm and dry.  Neurological:     Mental Status: He is alert and oriented to person, place, and time.     Motor: Weakness present.  Psychiatric:        Mood and Affect: Mood normal.        Behavior: Behavior normal.        Thought Content: Thought content normal.        Judgment: Judgment normal.     BP (!) 96/53 Comment: pt reports he runs low  Pulse 90   Resp 18   Ht 5' 10 (1.778 m)   Wt 189 lb 6.4 oz (85.9 kg)   BMI 27.18 kg/m   Past Medical History:  Diagnosis Date   Anginal pain    Arthritis    Ascending aortic aneurysm 07/18/2015   a.) TTE 07/18/2015: severe dilation of ascending aorta measuring 7.0 cm. b.) TTE 01/10/2018: aortic root 3.8 cm; ascending aorta 6.8 cm; refused surgical intervention/repair.   Atrial fibrillation (HCC)    a.) CHA2DS2-VASc = 6 (age x 2, HFrEF, HTN, previous MI, T2DM). b.) rate/rhythm maintained on amiodarone  + metoprolol  succinate;  chronic antiplatelet therapy using clopidogrel    Bradycardia    Coronary artery disease    ESRD (end stage renal disease) (HCC)    GERD (gastroesophageal reflux disease)    HFrEF (heart failure with reduced ejection fraction) (HCC)    a.) TTE 12/03/2013: mod dec LV function; EF 35-40%; severe inferolateral and inferior HK; LA dilated; G3DD; PASP 52 mmHg. b.) TTE 07/18/2015: mild LV dysfunction with mild LVH; EF 45%; mild BAE. 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 c.)  TTE 01/10/2018: mod AR/TR    Social History   Socioeconomic History   Marital status: Widowed    Spouse name: Not on file   Number of children: Not on file   Years of education: Not on file   Highest education level: Not on file  Occupational History   Not on file  Tobacco Use   Smoking status: Former    Current packs/day: 0.00    Types: Pipe, Cigarettes    Quit date: 44    Years since quitting: 33.7   Smokeless tobacco: Never  Vaping Use   Vaping status: Never Used  Substance and Sexual Activity   Alcohol use: Not Currently    Alcohol/week: 1.0 standard drink of alcohol    Types: 1 Cans of beer per week    Comment: occasionally drinking only,once a month   Drug use: No   Sexual activity: Never  Other Topics Concern   Not on file  Social History Narrative   Lives with daughter and  son in law   Social Drivers of Health   Financial Resource Strain: Low Risk  (01/04/2024)   Received [facility]   Overall Financial Resource Strain (CARDIA)    Difficulty of Paying Living Expenses: Not hard at all  Food Insecurity: No Food Insecurity (01/04/2024)   Received from [facility]   Hunger Vital Sign    Within the past 12 months, you worried that your food would run out before you got the money to buy more.: Never true    Within the past 12 months, the food you bought just didn't last and you didn't have money to get more.: Never true  Transportation Needs: No Transportation Needs (01/04/2024)   Received from [facility] - Transportation    In the past 12 months, has lack of transportation kept you from medical appointments or from getting medications?: No    Lack of Transportation (Non-Medical): No  Physical Activity: Inactive (01/10/2018)   Exercise Vital Sign    Days of Exercise per Week: 0 days    Minutes of Exercise per Session: 0 min  Stress: No Stress Concern Present (01/10/2018)   [employer] of [employer]ational [employer] - Occupational Stress Questionnaire    Feeling of Stress : Not at all  Social Connections: Socially Integrated (01/10/2018)   Social Connection and Isolation Panel    Frequency of Communication with Friends and Family: More than three times a week    Frequency of Social Gatherings with Friends and Family: More than three times a week    Attends Religious Services: More than 4 times per year    Active Member of Clubs or Organizations: Yes    Attends Banker Meetings: More than 4 times per year    Marital Status: Married  Catering manager Violence: Not At Risk (02/04/2023)   Humiliation, Afraid, Rape, and Kick questionnaire    Fear of Current or Ex-Partner: No    Emotionally Abused: No    Physically Abused: No    Sexually Abused: No    Past Surgical History:   Procedure Laterality Date   A/V FISTULAGRAM Left 02/09/2023   Procedure: A/V Fistulagram;  Surgeon: Marea Selinda RAMAN, MD;  Location: [facility];  Service: Cardiovascular;  Laterality: Left;   A/V SHUNT INTERVENTION N/A 01/25/2023   Procedure: A/V SHUNT INTERVENTION;  Surgeon: Jama Cordella MATSU, MD;  Location: [facility];  Service: Cardiovascular;  Laterality: N/A;  A/V SHUNTOGRAM Left 04/01/2022   Procedure: A/V SHUNTOGRAM;  Surgeon: Marea Selinda RAMAN, MD;  Location: [facility];  Service: Cardiovascular;  Laterality: Left;   AV FISTULA PLACEMENT Left 04/15/2021   Procedure: INSERTION OF ARTERIOVENOUS (AV) GORE-TEX GRAFT ARM ( BRACHIAL AXILLARY);  Surgeon: Marea Selinda RAMAN, MD;  Location: [facility];  Service: Vascular;  Laterality: Left;   CATARACT EXTRACTION W/PHACO Left 07/26/2017   Procedure: CATARACT EXTRACTION PHACO AND INTRAOCULAR LENS PLACEMENT (IOC);  Surgeon: Jaye Fallow, MD;  Location: [facility];  Service: Ophthalmology;  Laterality: Left;  US    00:45.8 AP%  13.1 CDE  6.00 Fluid Pack Lot # [id number]   CATARACT EXTRACTION W/PHACO Right 08/17/2017   Procedure: CATARACT EXTRACTION PHACO AND INTRAOCULAR LENS PLACEMENT (IOC);  Surgeon: Jaye Fallow, MD;  Location: [facility];  Service: Ophthalmology;  Laterality: Right;  US   01:30 AP% 16.8 CDE 15.24 Fluid pack lot # [id number]   CHOLECYSTECTOMY     CORONARY ARTERY BYPASS GRAFT N/A 2001   Procedure: 5v CABG (LIMA-LAD, SVG-D1, SVG-OM1, SVG-OM2, SVG-PDA)   DIALYSIS/PERMA CATHETER INSERTION N/A 02/09/2018   Procedure: DIALYSIS/PERMA CATHETER INSERTION;  Surgeon: Marea Selinda RAMAN, MD;  Location: [facility];  Service: Cardiovascular;  Laterality: N/A;   DIALYSIS/PERMA CATHETER INSERTION N/A 02/09/2023   Procedure: DIALYSIS/PERMA CATHETER INSERTION;  Surgeon: Marea Selinda RAMAN, MD;  Location: [facility];  Service: Cardiovascular;  Laterality: N/A;   DIALYSIS/PERMA CATHETER INSERTION N/A 11/28/2023   Procedure:  DIALYSIS/PERMA CATHETER INSERTION;  Surgeon: Marea Selinda RAMAN, MD;  Location: [facility];  Service: Cardiovascular;  Laterality: N/A;   DIALYSIS/PERMA CATHETER REMOVAL N/A 06/25/2021   Procedure: DIALYSIS/PERMA CATHETER REMOVAL;  Surgeon: Marea Selinda RAMAN, MD;  Location: [facility];  Service: Cardiovascular;  Laterality: N/A;   FRACTURE SURGERY Left 2015   LEFT arm   INSERTION OF DIALYSIS CATHETER Right 11/03/2020   Procedure: INSERTION OF Perm Cath in the RIGHT INTERNAL JUGULAR;  Surgeon: Marea Selinda RAMAN, MD;  Location: [facility];  Service: Vascular;  Laterality: Right;   INTRAMEDULLARY (IM) NAIL INTERTROCHANTERIC Right 11/12/2022   Procedure: INTRAMEDULLARY (IM) NAIL INTERTROCHANTERIC;  Surgeon: Marchia Drivers, MD;  Location: [facility];  Service: Orthopedics;  Laterality: Right;   LEFT HEART CATHETERIZATION WITH CORONARY/GRAFT ANGIOGRAM Left 12/04/2013   Procedure: LEFT HEART CATHETERIZATION WITH EL BILE;  Surgeon: Alm LELON Clay, MD;  Location: [facility] CATH LAB;  Service: Cardiovascular;  Laterality: Left;   LEFT HEART CATHETERIZATION WITH CORONARY/GRAFT ANGIOGRAM Left 02/06/2009   Procedure: LEFT HEART CATHETERIZATION WITH CORONARY/GRAFT ANGIOGRAM; Location: [facility]; Surgeon: Vita Bathe, MD   PERCUTANEOUS CORONARY STENT INTERVENTION (PCI-S) N/A 12/06/2013   Procedure: STAGED PERCUTANEOUS CORONARY STENT INTERVENTION (overlapping 4.0 x 38 mm (mid) and 4.0 x 28 mm (ostial) Promus Primier DES to SVG-RPDA graft);  Surgeon: Victory LELON Claudene DOUGLAS, MD;  Location: [facility]ATH LAB;  Service: Cardiovascular   PERIPHERAL VASCULAR THROMBECTOMY Left 08/31/2023   Procedure: PERIPHERAL VASCULAR THROMBECTOMY;  Surgeon: Jama Cordella MATSU, MD;  Location: [facility];  Service: Cardiovascular;  Laterality: Left;   REMOVAL OF A DIALYSIS CATHETER N/A 11/03/2020   Procedure: REMOVAL OF A DIALYSIS CATHETER;  Surgeon: Marea Selinda RAMAN, MD;  Location: [facility];  Service: Vascular;  Laterality: N/A;    TEMPORARY DIALYSIS CATHETER N/A 02/04/2023   Procedure: TEMPORARY DIALYSIS CATHETER;  Surgeon: Jama Cordella MATSU, MD;  Location: [facility];  Service: Cardiovascular;  Laterality: N/A;   TOTAL HIP REVISION Right 01/12/2023   Procedure: CONVERSION  TO  RIGHT TOTAL HIP REPLACEMENT;  Surgeon: Edna Toribio LABOR, MD;  Location: [facility];  Service: Orthopedics;  Laterality: Right;    Family History  Problem Relation Age of Onset   Heart attack Father 45       died first MI at age 19   Heart failure Mother     Allergies  Allergen Reactions   Amoxicillin Diarrhea       Latest Ref Rng & Units 11/25/2023    1:20 PM 09/29/2023    9:59 AM 02/10/2023    8:58 AM  CBC  WBC 4.0 - 10.5 K/uL 5.5  5.8  6.6   Hemoglobin 13.0 - 17.0 g/dL 88.1  88.0  7.9   Hematocrit 39.0 - 52.0 % 35.7  36.3  25.6   Platelets 150 - 400 K/uL 102  120  183       CMP     Component Value Date/Time   NA 137 11/25/2023 1320   NA 143 09/29/2023 0959   K 4.8 11/25/2023 1320   CL 102 11/25/2023 1320   CO2 24 11/25/2023 1320   GLUCOSE 117 (H) 11/25/2023 1320   BUN 70 (H) 11/25/2023 1320   BUN 56 (H) 09/29/2023 0959   CREATININE 3.33 (H) 11/25/2023 1320   CALCIUM  9.7 11/25/2023 1320   PROT 7.1 11/25/2023 1320   PROT 6.9 09/29/2023 0959   ALBUMIN  3.4 (L) 11/25/2023 1320   ALBUMIN  4.2 09/29/2023 0959   AST 31 11/25/2023 1320   ALT 24 11/25/2023 1320   ALKPHOS 227 (H) 11/25/2023 1320   BILITOT 0.9 11/25/2023 1320   BILITOT 0.6 09/29/2023 0959   EGFR 20 (L) 09/29/2023 0959   GFRNONAA 17 (L) 11/25/2023 1320     No results found.     Assessment & Plan:   1. ESRD (end stage renal disease) (HCC) (Primary) I had a long discussion with the patient and family regarding dialysis access placement.  The patient was previous graft occluded and very short session multiple times after declotting.  This is very likely related to his hypotension that occurs following his dialysis sessions.  Based on this, if the  patient would have a new graft placed he will likely faced the same issues.  Given the patient's age, I do not feel that it would be beneficial for him to undergo multiple procedures when there is also a good chance his graft would ultimately fail.  This is also in consensus with Dr. Marea discussing ablation from his most recent procedure.  Based on this we feel it is all patient best interest to be PermCath dependent.  We will follow-up with him on an as-needed basis.  2. 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 [facility]) Continue hypoglycemic medications as already ordered, these medications have been reviewed and there are no changes at this time.  Hgb A1C to be monitored as already arranged by primary service   Current Outpatient Medications on File Prior to Visit  Medication Sig Dispense Refill   aspirin  EC 81 MG tablet Take 81 mg by mouth daily. Swallow whole.     calcium  carbonate (TUMS EX) 750 MG chewable tablet Chew 1 tablet by mouth 3 (three) times daily.     cholecalciferol  (VITAMIN D ) 25 MCG (1000 UNIT) tablet Take 1,000 Units by mouth daily with breakfast.     clopidogrel  (PLAVIX ) 75 MG tablet TAKE 1 TABLET BY MOUTH DAILY 90 tablet 3   ferrous sulfate  325 (65 FE) MG tablet Take 325  mg by mouth 2 (two) times daily with a meal.      fluticasone  (FLONASE ) 50 MCG/ACT nasal spray Place 2 sprays into both nostrils daily as needed for allergies or rhinitis.     furosemide  (LASIX ) 20 MG tablet Take 1 tablet (20 mg total) by mouth daily. 30 tablet 11   glipiZIDE  (GLUCOTROL ) 5 MG tablet Take 2.5 mg by mouth daily before breakfast.     levothyroxine  (SYNTHROID ) 100 MCG tablet TAKE 1 TABLET BY MOUTH IN THE  MORNING 90 tablet 3   midodrine  (PROAMATINE ) 10 MG tablet See administration instructions. Take 10 mg by mouth 1 hour before dialysis and 30 minutes before procedure ends.     multivitamin (RENA-VIT) TABS tablet Take 1 tablet by mouth daily.     nitroGLYCERIN  (NITROSTAT ) 0.4 MG SL tablet  DISSOLVE 1 TABLET UNDER THE  TONGUE EVERY 5 MINUTES AS NEEDED FOR CHEST PAIN. MAX OF 3 TABLETS IN 15 MINUTES. CALL 911 IF PAIN  PERSISTS. 100 tablet 3   Omega-3 Fatty Acids (FISH OIL ) 1000 MG CAPS Take 4,000 mg by mouth daily with lunch.     potassium chloride  SA (KLOR-CON  M) 20 MEQ tablet Take 20 mEq by mouth daily.     rosuvastatin  (CRESTOR ) 40 MG tablet Take 1 tablet (40 mg total) by mouth daily. 90 tablet 3   cetirizine (ZYRTEC) 10 MG tablet Take 10 mg by mouth daily. (Patient not taking: Reported on 02/16/2024)     lidocaine -prilocaine  (EMLA ) cream Apply 1 Application topically as needed (before dialysis). (Patient not taking: Reported on 02/16/2024)     polyethylene glycol (MIRALAX  / GLYCOLAX ) 17 g packet Take 17 g by mouth daily. (Patient not taking: Reported on 02/16/2024) 14 each 0   senna-docusate (SENOKOT-S) 8.6-50 MG tablet Take 2 tablets by mouth 2 (two) times daily. (Patient not taking: Reported on 02/16/2024) 30 tablet 0   No current facility-administered medications on file prior to visit.    There are no Patient Instructions on file for this visit. No follow-ups on file.   Arash Karstens E Latrice Storlie, NP

## 2024-02-19 ENCOUNTER — Encounter (INDEPENDENT_AMBULATORY_CARE_PROVIDER_SITE_OTHER): Payer: Self-pay | Admitting: Nurse Practitioner

## 2024-02-20 DIAGNOSIS — N250 Renal osteodystrophy: Secondary | ICD-10-CM | POA: Diagnosis not present

## 2024-02-20 DIAGNOSIS — N186 End stage renal disease: Secondary | ICD-10-CM | POA: Diagnosis not present

## 2024-02-20 DIAGNOSIS — D631 Anemia in chronic kidney disease: Secondary | ICD-10-CM | POA: Diagnosis not present

## 2024-02-20 DIAGNOSIS — Z992 Dependence on renal dialysis: Secondary | ICD-10-CM | POA: Diagnosis not present

## 2024-02-20 DIAGNOSIS — D509 Iron deficiency anemia, unspecified: Secondary | ICD-10-CM | POA: Diagnosis not present

## 2024-02-21 DIAGNOSIS — N186 End stage renal disease: Secondary | ICD-10-CM | POA: Diagnosis not present

## 2024-02-21 DIAGNOSIS — Z992 Dependence on renal dialysis: Secondary | ICD-10-CM | POA: Diagnosis not present

## 2024-02-22 DIAGNOSIS — N186 End stage renal disease: Secondary | ICD-10-CM | POA: Diagnosis not present

## 2024-02-22 DIAGNOSIS — Z992 Dependence on renal dialysis: Secondary | ICD-10-CM | POA: Diagnosis not present

## 2024-02-22 DIAGNOSIS — Z23 Encounter for immunization: Secondary | ICD-10-CM | POA: Diagnosis not present

## 2024-02-22 DIAGNOSIS — N250 Renal osteodystrophy: Secondary | ICD-10-CM | POA: Diagnosis not present

## 2024-02-22 DIAGNOSIS — D509 Iron deficiency anemia, unspecified: Secondary | ICD-10-CM | POA: Diagnosis not present

## 2024-02-24 DIAGNOSIS — Z23 Encounter for immunization: Secondary | ICD-10-CM | POA: Diagnosis not present

## 2024-02-24 DIAGNOSIS — N186 End stage renal disease: Secondary | ICD-10-CM | POA: Diagnosis not present

## 2024-02-24 DIAGNOSIS — Z992 Dependence on renal dialysis: Secondary | ICD-10-CM | POA: Diagnosis not present

## 2024-02-24 DIAGNOSIS — D509 Iron deficiency anemia, unspecified: Secondary | ICD-10-CM | POA: Diagnosis not present

## 2024-02-24 DIAGNOSIS — N250 Renal osteodystrophy: Secondary | ICD-10-CM | POA: Diagnosis not present

## 2024-02-27 DIAGNOSIS — D509 Iron deficiency anemia, unspecified: Secondary | ICD-10-CM | POA: Diagnosis not present

## 2024-02-27 DIAGNOSIS — N250 Renal osteodystrophy: Secondary | ICD-10-CM | POA: Diagnosis not present

## 2024-02-27 DIAGNOSIS — N186 End stage renal disease: Secondary | ICD-10-CM | POA: Diagnosis not present

## 2024-02-27 DIAGNOSIS — Z23 Encounter for immunization: Secondary | ICD-10-CM | POA: Diagnosis not present

## 2024-02-27 DIAGNOSIS — Z992 Dependence on renal dialysis: Secondary | ICD-10-CM | POA: Diagnosis not present

## 2024-02-29 DIAGNOSIS — N186 End stage renal disease: Secondary | ICD-10-CM | POA: Diagnosis not present

## 2024-02-29 DIAGNOSIS — N250 Renal osteodystrophy: Secondary | ICD-10-CM | POA: Diagnosis not present

## 2024-02-29 DIAGNOSIS — D509 Iron deficiency anemia, unspecified: Secondary | ICD-10-CM | POA: Diagnosis not present

## 2024-02-29 DIAGNOSIS — Z23 Encounter for immunization: Secondary | ICD-10-CM | POA: Diagnosis not present

## 2024-02-29 DIAGNOSIS — Z992 Dependence on renal dialysis: Secondary | ICD-10-CM | POA: Diagnosis not present

## 2024-03-02 DIAGNOSIS — Z23 Encounter for immunization: Secondary | ICD-10-CM | POA: Diagnosis not present

## 2024-03-02 DIAGNOSIS — N186 End stage renal disease: Secondary | ICD-10-CM | POA: Diagnosis not present

## 2024-03-02 DIAGNOSIS — Z992 Dependence on renal dialysis: Secondary | ICD-10-CM | POA: Diagnosis not present

## 2024-03-02 DIAGNOSIS — N250 Renal osteodystrophy: Secondary | ICD-10-CM | POA: Diagnosis not present

## 2024-03-02 DIAGNOSIS — D509 Iron deficiency anemia, unspecified: Secondary | ICD-10-CM | POA: Diagnosis not present

## 2024-03-05 DIAGNOSIS — E119 Type 2 diabetes mellitus without complications: Secondary | ICD-10-CM | POA: Diagnosis not present

## 2024-03-05 DIAGNOSIS — N186 End stage renal disease: Secondary | ICD-10-CM | POA: Diagnosis not present

## 2024-03-05 DIAGNOSIS — Z23 Encounter for immunization: Secondary | ICD-10-CM | POA: Diagnosis not present

## 2024-03-05 DIAGNOSIS — D509 Iron deficiency anemia, unspecified: Secondary | ICD-10-CM | POA: Diagnosis not present

## 2024-03-05 DIAGNOSIS — Z992 Dependence on renal dialysis: Secondary | ICD-10-CM | POA: Diagnosis not present

## 2024-03-05 DIAGNOSIS — N250 Renal osteodystrophy: Secondary | ICD-10-CM | POA: Diagnosis not present

## 2024-03-07 DIAGNOSIS — D509 Iron deficiency anemia, unspecified: Secondary | ICD-10-CM | POA: Diagnosis not present

## 2024-03-07 DIAGNOSIS — N186 End stage renal disease: Secondary | ICD-10-CM | POA: Diagnosis not present

## 2024-03-07 DIAGNOSIS — N250 Renal osteodystrophy: Secondary | ICD-10-CM | POA: Diagnosis not present

## 2024-03-07 DIAGNOSIS — Z23 Encounter for immunization: Secondary | ICD-10-CM | POA: Diagnosis not present

## 2024-03-07 DIAGNOSIS — Z992 Dependence on renal dialysis: Secondary | ICD-10-CM | POA: Diagnosis not present

## 2024-03-09 DIAGNOSIS — N250 Renal osteodystrophy: Secondary | ICD-10-CM | POA: Diagnosis not present

## 2024-03-09 DIAGNOSIS — N186 End stage renal disease: Secondary | ICD-10-CM | POA: Diagnosis not present

## 2024-03-09 DIAGNOSIS — Z23 Encounter for immunization: Secondary | ICD-10-CM | POA: Diagnosis not present

## 2024-03-09 DIAGNOSIS — D509 Iron deficiency anemia, unspecified: Secondary | ICD-10-CM | POA: Diagnosis not present

## 2024-03-09 DIAGNOSIS — Z992 Dependence on renal dialysis: Secondary | ICD-10-CM | POA: Diagnosis not present

## 2024-03-12 DIAGNOSIS — Z23 Encounter for immunization: Secondary | ICD-10-CM | POA: Diagnosis not present

## 2024-03-12 DIAGNOSIS — N250 Renal osteodystrophy: Secondary | ICD-10-CM | POA: Diagnosis not present

## 2024-03-12 DIAGNOSIS — Z992 Dependence on renal dialysis: Secondary | ICD-10-CM | POA: Diagnosis not present

## 2024-03-12 DIAGNOSIS — D509 Iron deficiency anemia, unspecified: Secondary | ICD-10-CM | POA: Diagnosis not present

## 2024-03-12 DIAGNOSIS — N186 End stage renal disease: Secondary | ICD-10-CM | POA: Diagnosis not present

## 2024-03-14 DIAGNOSIS — N250 Renal osteodystrophy: Secondary | ICD-10-CM | POA: Diagnosis not present

## 2024-03-14 DIAGNOSIS — Z992 Dependence on renal dialysis: Secondary | ICD-10-CM | POA: Diagnosis not present

## 2024-03-14 DIAGNOSIS — N186 End stage renal disease: Secondary | ICD-10-CM | POA: Diagnosis not present

## 2024-03-14 DIAGNOSIS — D509 Iron deficiency anemia, unspecified: Secondary | ICD-10-CM | POA: Diagnosis not present

## 2024-03-14 DIAGNOSIS — Z23 Encounter for immunization: Secondary | ICD-10-CM | POA: Diagnosis not present

## 2024-03-16 DIAGNOSIS — Z23 Encounter for immunization: Secondary | ICD-10-CM | POA: Diagnosis not present

## 2024-03-16 DIAGNOSIS — N186 End stage renal disease: Secondary | ICD-10-CM | POA: Diagnosis not present

## 2024-03-16 DIAGNOSIS — Z992 Dependence on renal dialysis: Secondary | ICD-10-CM | POA: Diagnosis not present

## 2024-03-16 DIAGNOSIS — D509 Iron deficiency anemia, unspecified: Secondary | ICD-10-CM | POA: Diagnosis not present

## 2024-03-16 DIAGNOSIS — N250 Renal osteodystrophy: Secondary | ICD-10-CM | POA: Diagnosis not present

## 2024-03-19 DIAGNOSIS — Z23 Encounter for immunization: Secondary | ICD-10-CM | POA: Diagnosis not present

## 2024-03-19 DIAGNOSIS — D509 Iron deficiency anemia, unspecified: Secondary | ICD-10-CM | POA: Diagnosis not present

## 2024-03-19 DIAGNOSIS — N186 End stage renal disease: Secondary | ICD-10-CM | POA: Diagnosis not present

## 2024-03-19 DIAGNOSIS — N250 Renal osteodystrophy: Secondary | ICD-10-CM | POA: Diagnosis not present

## 2024-03-19 DIAGNOSIS — Z992 Dependence on renal dialysis: Secondary | ICD-10-CM | POA: Diagnosis not present

## 2024-03-20 ENCOUNTER — Ambulatory Visit (INDEPENDENT_AMBULATORY_CARE_PROVIDER_SITE_OTHER): Admitting: Cardiovascular Disease

## 2024-03-20 ENCOUNTER — Encounter: Payer: Self-pay | Admitting: Cardiovascular Disease

## 2024-03-20 VITALS — BP 109/70 | HR 75 | Ht 70.0 in | Wt 191.2 lb

## 2024-03-20 DIAGNOSIS — I5022 Chronic systolic (congestive) heart failure: Secondary | ICD-10-CM | POA: Diagnosis not present

## 2024-03-20 DIAGNOSIS — I480 Paroxysmal atrial fibrillation: Secondary | ICD-10-CM | POA: Diagnosis not present

## 2024-03-20 DIAGNOSIS — I25810 Atherosclerosis of coronary artery bypass graft(s) without angina pectoris: Secondary | ICD-10-CM

## 2024-03-20 DIAGNOSIS — Z131 Encounter for screening for diabetes mellitus: Secondary | ICD-10-CM

## 2024-03-20 DIAGNOSIS — N186 End stage renal disease: Secondary | ICD-10-CM

## 2024-03-20 DIAGNOSIS — E782 Mixed hyperlipidemia: Secondary | ICD-10-CM | POA: Diagnosis not present

## 2024-03-20 DIAGNOSIS — I429 Cardiomyopathy, unspecified: Secondary | ICD-10-CM

## 2024-03-20 DIAGNOSIS — R0602 Shortness of breath: Secondary | ICD-10-CM

## 2024-03-20 DIAGNOSIS — Z0130 Encounter for examination of blood pressure without abnormal findings: Secondary | ICD-10-CM

## 2024-03-20 MED ORDER — FLUTICASONE PROPIONATE 50 MCG/ACT NA SUSP: 2.0000 | Freq: Every day | NASAL | 1 refills | Status: DC | PRN

## 2024-03-20 MED ORDER — FLUTICASONE PROPIONATE 50 MCG/ACT NA SUSP
2.0000 | Freq: Every day | NASAL | 1 refills | Status: AC | PRN
Start: 2024-03-20 — End: ?

## 2024-03-20 MED ORDER — AMIODARONE HCL 200 MG PO TABS: ORAL_TABLET | ORAL | 0 refills | Status: DC

## 2024-03-20 NOTE — Progress Notes (Signed)
 Cardiology Office Note   Date:  03/20/2024   ID:  RIELEY HAUSMAN, DOB 1930-12-18, MRN [id number]  PCP:  Albina GORMAN Dine, MD  Cardiologist:  Denyse Bathe, MD      History of Present Illness: Matthew [name] is a 89 y.o. male who presents for  Chief Complaint  Patient presents with   Follow-up    3 month follow up. Pt having SOB for the last month     Has lots of SOB,and getting worse.      Past Medical History:  Diagnosis Date   Anginal pain    Arthritis    Ascending aortic aneurysm 07/18/2015   a.) TTE 07/18/2015: severe dilation of ascending aorta measuring 7.0 cm. b.) TTE 01/10/2018: aortic root 3.8 cm; ascending aorta 6.8 cm; refused surgical intervention/repair.   Atrial fibrillation (HCC)    a.) CHA2DS2-VASc = 6 (age x 2, HFrEF, HTN, previous MI, T2DM). b.) rate/rhythm maintained on amiodarone  + metoprolol  succinate; chronic antiplatelet therapy using clopidogrel    Bradycardia    Coronary artery disease    ESRD (end stage renal disease) (HCC)    GERD (gastroesophageal reflux disease)    HFrEF (heart failure with reduced ejection fraction) (HCC)    a.) TTE 12/03/2013: mod dec LV function; EF 35-40%; severe inferolateral and inferior HK; LA dilated; G3DD; PASP 52 mmHg. b.) TTE 07/18/2015: mild LV dysfunction with mild LVH; EF 45%; mild BAE. c.) TTE 01/10/2018: mod LV dysfunction; EF 45%; diffuse HK   HLD (hyperlipidemia)    Hypertension    IDA (iron deficiency anemia)    Long term current use of antithrombotics/antiplatelets    a.) clopidogrel    Myocardial infarction (HCC) 2000   Pneumonia    S/P CABG x 4 2001   a.) LIMA-LAD, SVG-D1, SVG-OM1, SVG-OM2, SVG-PDA   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   left frontal scalp, EDC   Squamous cell carcinoma of skin 05/05/2020   left temporal scalp, in situ, EDC 05/13/20   T2DM (type 2 diabetes mellitus) (HCC)    Valvular insufficiency 12/03/2013   a.) TTE 12/03/2013: EF 35-40%; mild AR/MR, b.) TTE  07/18/2015: EF 45%; mild AR/PR, mod TR, severe MR. c.) TTE 01/10/2018: mod AR/TR     Past Surgical History:  Procedure Laterality Date   A/V FISTULAGRAM Left 02/09/2023   Procedure: A/V Fistulagram;  Surgeon: Marea Selinda GORMAN, MD;  Location: [facility];  Service: Cardiovascular;  Laterality: Left;   A/V SHUNT INTERVENTION N/A 01/25/2023   Procedure: A/V SHUNT INTERVENTION;  Surgeon: Jama Cordella MATSU, MD;  Location: [facility];  Service: Cardiovascular;  Laterality: N/A;   A/V SHUNTOGRAM Left 04/01/2022   Procedure: A/V SHUNTOGRAM;  Surgeon: Marea Selinda GORMAN, MD;  Location: [facility];  Service: Cardiovascular;  Laterality: Left;   AV FISTULA PLACEMENT Left 04/15/2021   Procedure: INSERTION OF ARTERIOVENOUS (AV) GORE-TEX GRAFT ARM ( BRACHIAL AXILLARY);  Surgeon: Marea Selinda GORMAN, MD;  Location: [facility];  Service: Vascular;  Laterality: Left;   CATARACT EXTRACTION W/PHACO Left 07/26/2017   Procedure: CATARACT EXTRACTION PHACO AND INTRAOCULAR LENS PLACEMENT (IOC);  Surgeon: Jaye Fallow, MD;  Location: [facility];  Service: Ophthalmology;  Laterality: Left;  US    00:45.8 AP%  13.1 CDE  6.00 Fluid Pack Lot # [id number]   CATARACT EXTRACTION W/PHACO Right 08/17/2017   Procedure: CATARACT EXTRACTION PHACO AND INTRAOCULAR LENS PLACEMENT (IOC);  Surgeon: Jaye Fallow, MD;  Location: [facility];  Service: Ophthalmology;  Laterality: Right;  US   01:30 AP% 16.8 CDE 15.24 Fluid pack lot # [id number]   CHOLECYSTECTOMY     CORONARY ARTERY BYPASS GRAFT N/A 2001   Procedure: 5v CABG (LIMA-LAD, SVG-D1, SVG-OM1, SVG-OM2, SVG-PDA)   DIALYSIS/PERMA CATHETER INSERTION N/A 02/09/2018   Procedure: DIALYSIS/PERMA CATHETER INSERTION;  Surgeon: Marea Selinda RAMAN, MD;  Location: [facility];  Service: Cardiovascular;  Laterality: N/A;   DIALYSIS/PERMA CATHETER INSERTION N/A 02/09/2023   Procedure: DIALYSIS/PERMA CATHETER INSERTION;  Surgeon: Marea Selinda RAMAN, MD;  Location: [facility];   Service: Cardiovascular;  Laterality: N/A;   DIALYSIS/PERMA CATHETER INSERTION N/A 11/28/2023   Procedure: DIALYSIS/PERMA CATHETER INSERTION;  Surgeon: Marea Selinda RAMAN, MD;  Location: [facility];  Service: Cardiovascular;  Laterality: N/A;   DIALYSIS/PERMA CATHETER REMOVAL N/A 06/25/2021   Procedure: DIALYSIS/PERMA CATHETER REMOVAL;  Surgeon: Marea Selinda RAMAN, MD;  Location: [facility];  Service: Cardiovascular;  Laterality: N/A;   FRACTURE SURGERY Left 2015   LEFT arm   INSERTION OF DIALYSIS CATHETER Right 11/03/2020   Procedure: INSERTION OF Perm Cath in the RIGHT INTERNAL JUGULAR;  Surgeon: Marea Selinda RAMAN, MD;  Location: [facility];  Service: Vascular;  Laterality: Right;   INTRAMEDULLARY (IM) NAIL INTERTROCHANTERIC Right 11/12/2022   Procedure: INTRAMEDULLARY (IM) NAIL INTERTROCHANTERIC;  Surgeon: Marchia Drivers, MD;  Location: [facility];  Service: Orthopedics;  Laterality: Right;   LEFT HEART CATHETERIZATION WITH CORONARY/GRAFT ANGIOGRAM Left 12/04/2013   Procedure: LEFT HEART CATHETERIZATION WITH EL BILE;  Surgeon: Alm LELON Clay, MD;  Location: [facility];  Service: Cardiovascular;  Laterality: Left;   LEFT HEART CATHETERIZATION WITH CORONARY/GRAFT ANGIOGRAM Left 02/06/2009   Procedure: LEFT HEART CATHETERIZATION WITH CORONARY/GRAFT ANGIOGRAM; Location: [facility]; Surgeon: Vita Bathe, MD   PERCUTANEOUS CORONARY STENT INTERVENTION (PCI-S) N/A 12/06/2013   Procedure: STAGED PERCUTANEOUS CORONARY STENT INTERVENTION (overlapping 4.0 x 38 mm (mid) and 4.0 x 28 mm (ostial) Promus Primier DES to SVG-RPDA graft);  Surgeon: Victory LELON Claudene DOUGLAS, MD;  Location: [facility] CATH LAB;  Service: Cardiovascular   PERIPHERAL VASCULAR THROMBECTOMY Left 08/31/2023   Procedure: PERIPHERAL VASCULAR THROMBECTOMY;  Surgeon: Jama Cordella MATSU, MD;  Location: [facility];  Service: Cardiovascular;  Laterality: Left;   REMOVAL OF A DIALYSIS CATHETER N/A 11/03/2020   Procedure: REMOVAL OF A  DIALYSIS CATHETER;  Surgeon: Marea Selinda RAMAN, MD;  Location: [facility];  Service: Vascular;  Laterality: N/A;   TEMPORARY DIALYSIS CATHETER N/A 02/04/2023   Procedure: TEMPORARY DIALYSIS CATHETER;  Surgeon: Jama Cordella MATSU, MD;  Location: [facility];  Service: Cardiovascular;  Laterality: N/A;   TOTAL HIP REVISION Right 01/12/2023   Procedure: CONVERSION TO  RIGHT TOTAL HIP REPLACEMENT;  Surgeon: Edna Toribio LABOR, MD;  Location: [facility];  Service: Orthopedics;  Laterality: Right;     Current Outpatient Medications  Medication Sig Dispense Refill   amiodarone  (PACERONE ) 200 MG tablet Take 4 tablets/day for 1 week, then 3 tablets/day for 1 week, then 2 tablets/day for 1 week. Then follow up. 70 tablet 0   aspirin  EC 81 MG tablet Take 81 mg by mouth daily. Swallow whole.     calcium  carbonate (TUMS EX) 750 MG chewable tablet Chew 1 tablet by mouth 3 (three) times daily.     cholecalciferol  (VITAMIN D ) 25 MCG (1000 UNIT) tablet Take 1,000 Units by mouth daily with breakfast.     clopidogrel  (PLAVIX ) 75 MG tablet TAKE 1 TABLET BY MOUTH DAILY 90 tablet 3   ferrous sulfate  325 (65 FE)  MG tablet Take 325 mg by mouth 2 (two) times daily with a meal.      furosemide  (LASIX ) 20 MG tablet Take 1 tablet (20 mg total) by mouth daily. 30 tablet 11   glipiZIDE  (GLUCOTROL ) 5 MG tablet Take 2.5 mg by mouth daily before breakfast.     levothyroxine  (SYNTHROID ) 100 MCG tablet TAKE 1 TABLET BY MOUTH IN THE  MORNING 90 tablet 3   midodrine  (PROAMATINE ) 10 MG tablet See administration instructions. Take 10 mg by mouth 1 hour before dialysis and 30 minutes before procedure ends.     multivitamin (RENA-VIT) TABS tablet Take 1 tablet by mouth daily.     nitroGLYCERIN  (NITROSTAT ) 0.4 MG SL tablet DISSOLVE 1 TABLET UNDER THE  TONGUE EVERY 5 MINUTES AS NEEDED FOR CHEST PAIN. MAX OF 3 TABLETS IN 15 MINUTES. CALL 911 IF PAIN  PERSISTS. 100 tablet 3   Omega-3 Fatty Acids (FISH OIL ) 1000 MG CAPS Take 4,000 mg by mouth  daily with lunch.     potassium chloride  SA (KLOR-CON  M) 20 MEQ tablet Take 20 mEq by mouth daily.     rosuvastatin  (CRESTOR ) 40 MG tablet Take 1 tablet (40 mg total) by mouth daily. 90 tablet 3   cetirizine (ZYRTEC) 10 MG tablet Take 10 mg by mouth daily. (Patient not taking: Reported on 03/20/2024)     fluticasone  (FLONASE ) 50 MCG/ACT nasal spray Place 2 sprays into both nostrils daily as needed for allergies or rhinitis. 60 mL 1   No current facility-administered medications for this visit.    Allergies:   Amoxicillin    Social History:   reports that he quit smoking about 33 years ago. His smoking use included pipe and cigarettes. He has never used smokeless tobacco. He reports that he does not currently use alcohol after a past usage of about 1.0 standard drink of alcohol per week. He reports that he does not use drugs.   Family History:  family history includes Heart attack (age of onset: 28) in his father; Heart failure in his mother.    ROS:     Review of Systems  Constitutional: Negative.   HENT: Negative.    Eyes: Negative.   Respiratory: Negative.    Gastrointestinal: Negative.   Genitourinary: Negative.   Musculoskeletal: Negative.   Skin: Negative.   Neurological: Negative.   Endo/Heme/Allergies: Negative.   Psychiatric/Behavioral: Negative.    All other systems reviewed and are negative.     All other systems are reviewed and negative.    PHYSICAL EXAM: VS:  BP 109/70   Pulse 75   Ht 5' 10 (1.778 m)   Wt 191 lb 3.2 oz (86.7 kg)   SpO2 95%   BMI 27.43 kg/m  , BMI Body mass index is 27.43 kg/m. Last weight:  Wt Readings from Last 3 Encounters:  03/20/24 191 lb 3.2 oz (86.7 kg)  02/16/24 189 lb 6.4 oz (85.9 kg)  01/10/24 192 lb 3.2 oz (87.2 kg)     Physical Exam Vitals reviewed.  Constitutional:      Appearance: Normal appearance. He is normal weight.  HENT:     Head: Normocephalic.     Nose: Nose normal.     Mouth/Throat:     Mouth: Mucous  membranes are moist.  Eyes:     Pupils: Pupils are equal, round, and reactive to light.  Cardiovascular:     Rate and Rhythm: Normal rate and regular rhythm.     Pulses: Normal pulses.  Heart sounds: Normal heart sounds.  Pulmonary:     Effort: Pulmonary effort is normal.  Abdominal:     General: Abdomen is flat. Bowel sounds are normal.  Musculoskeletal:        General: Normal range of motion.     Cervical back: Normal range of motion.  Skin:    General: Skin is warm.  Neurological:     General: No focal deficit present.     Mental Status: He is alert.  Psychiatric:        Mood and Affect: Mood normal.       EKG:   Recent Labs: 06/30/2023: TSH 3.470 11/25/2023: ALT 24; BUN 70; Creatinine, Ser 3.33; Hemoglobin 11.8; Platelets 102; Potassium 4.8; Sodium 137    Lipid Panel    Component Value Date/Time   CHOL 122 09/29/2023 0959   TRIG 53 09/29/2023 0959   HDL 65 09/29/2023 0959   CHOLHDL 1.9 09/29/2023 0959   LDLCALC 45 09/29/2023 0959      Other studies Reviewed: Additional studies/ records that were reviewed today include:  Review of the above records demonstrates:       No data to display            ASSESSMENT AND PLAN:    ICD-10-CM   1. Coronary artery disease involving coronary bypass graft of native heart without angina pectoris  I25.810 fluticasone  (FLONASE ) 50 MCG/ACT nasal spray    amiodarone  (PACERONE ) 200 MG tablet    PCV ECHOCARDIOGRAM COMPLETE    DISCONTINUED: fluticasone  (FLONASE ) 50 MCG/ACT nasal spray    2. Cardiomyopathy, unspecified type (HCC)  I42.9 fluticasone  (FLONASE ) 50 MCG/ACT nasal spray    amiodarone  (PACERONE ) 200 MG tablet    PCV ECHOCARDIOGRAM COMPLETE    DISCONTINUED: fluticasone  (FLONASE ) 50 MCG/ACT nasal spray    3. Mixed hyperlipidemia  E78.2 fluticasone  (FLONASE ) 50 MCG/ACT nasal spray    amiodarone  (PACERONE ) 200 MG tablet    PCV ECHOCARDIOGRAM COMPLETE    DISCONTINUED: fluticasone  (FLONASE ) 50 MCG/ACT nasal spray     4. Paroxysmal atrial fibrillation (HCC)  I48.0 fluticasone  (FLONASE ) 50 MCG/ACT nasal spray    amiodarone  (PACERONE ) 200 MG tablet    PCV ECHOCARDIOGRAM COMPLETE    DISCONTINUED: fluticasone  (FLONASE ) 50 MCG/ACT nasal spray   In atrial fibrilation, start amiodrone bolus.    5. Chronic systolic CHF (congestive heart failure) (HCC)  I50.22 fluticasone  (FLONASE ) 50 MCG/ACT nasal spray    amiodarone  (PACERONE ) 200 MG tablet    PCV ECHOCARDIOGRAM COMPLETE    DISCONTINUED: fluticasone  (FLONASE ) 50 MCG/ACT nasal spray    6. ESRD (end stage renal disease) (HCC)  N18.6 fluticasone  (FLONASE ) 50 MCG/ACT nasal spray    amiodarone  (PACERONE ) 200 MG tablet    PCV ECHOCARDIOGRAM COMPLETE    DISCONTINUED: fluticasone  (FLONASE ) 50 MCG/ACT nasal spray    7. SOB (shortness of breath)  R06.02 fluticasone  (FLONASE ) 50 MCG/ACT nasal spray    amiodarone  (PACERONE ) 200 MG tablet    PCV ECHOCARDIOGRAM COMPLETE    DISCONTINUED: fluticasone  (FLONASE ) 50 MCG/ACT nasal spray       Problem List Items Addressed This Visit       Cardiovascular and Mediastinum   Atrial fibrillation (HCC)   Relevant Medications   fluticasone  (FLONASE ) 50 MCG/ACT nasal spray   amiodarone  (PACERONE ) 200 MG tablet   Other Relevant Orders   PCV ECHOCARDIOGRAM COMPLETE   Cardiomyopathy (HCC)   Relevant Medications   fluticasone  (FLONASE ) 50 MCG/ACT nasal spray   amiodarone  (PACERONE ) 200 MG tablet   Other  Relevant Orders   PCV ECHOCARDIOGRAM COMPLETE   Coronary artery disease - Primary   Relevant Medications   fluticasone  (FLONASE ) 50 MCG/ACT nasal spray   amiodarone  (PACERONE ) 200 MG tablet   Other Relevant Orders   PCV ECHOCARDIOGRAM COMPLETE     Genitourinary   ESRD (end stage renal disease) (HCC)   Relevant Medications   fluticasone  (FLONASE ) 50 MCG/ACT nasal spray   amiodarone  (PACERONE ) 200 MG tablet   Other Relevant Orders   PCV ECHOCARDIOGRAM COMPLETE     Other   HLD (hyperlipidemia)   Relevant  Medications   fluticasone  (FLONASE ) 50 MCG/ACT nasal spray   amiodarone  (PACERONE ) 200 MG tablet   Other Relevant Orders   PCV ECHOCARDIOGRAM COMPLETE   Other Visit Diagnoses       Chronic systolic CHF (congestive heart failure) (HCC)       Relevant Medications   fluticasone  (FLONASE ) 50 MCG/ACT nasal spray   amiodarone  (PACERONE ) 200 MG tablet   Other Relevant Orders   PCV ECHOCARDIOGRAM COMPLETE     SOB (shortness of breath)       Relevant Medications   fluticasone  (FLONASE ) 50 MCG/ACT nasal spray   amiodarone  (PACERONE ) 200 MG tablet   Other Relevant Orders   PCV ECHOCARDIOGRAM COMPLETE          Disposition:   Return in about 4 weeks (around 04/17/2024) for echo and f/u.    Total time spent: 30 minutes  Signed,  Denyse Bathe, MD  03/20/2024 11:32 AM    [facility]

## 2024-03-21 DIAGNOSIS — Z992 Dependence on renal dialysis: Secondary | ICD-10-CM | POA: Diagnosis not present

## 2024-03-21 DIAGNOSIS — N250 Renal osteodystrophy: Secondary | ICD-10-CM | POA: Diagnosis not present

## 2024-03-21 DIAGNOSIS — D509 Iron deficiency anemia, unspecified: Secondary | ICD-10-CM | POA: Diagnosis not present

## 2024-03-21 DIAGNOSIS — Z23 Encounter for immunization: Secondary | ICD-10-CM | POA: Diagnosis not present

## 2024-03-21 DIAGNOSIS — N186 End stage renal disease: Secondary | ICD-10-CM | POA: Diagnosis not present

## 2024-03-22 ENCOUNTER — Other Ambulatory Visit: Payer: Self-pay

## 2024-03-22 DIAGNOSIS — E782 Mixed hyperlipidemia: Secondary | ICD-10-CM

## 2024-03-22 DIAGNOSIS — I480 Paroxysmal atrial fibrillation: Secondary | ICD-10-CM

## 2024-03-22 DIAGNOSIS — I25810 Atherosclerosis of coronary artery bypass graft(s) without angina pectoris: Secondary | ICD-10-CM

## 2024-03-22 DIAGNOSIS — I429 Cardiomyopathy, unspecified: Secondary | ICD-10-CM

## 2024-03-22 DIAGNOSIS — R0602 Shortness of breath: Secondary | ICD-10-CM

## 2024-03-22 DIAGNOSIS — I5022 Chronic systolic (congestive) heart failure: Secondary | ICD-10-CM

## 2024-03-22 DIAGNOSIS — N186 End stage renal disease: Secondary | ICD-10-CM

## 2024-03-22 MED ORDER — AMIODARONE HCL 200 MG PO TABS: ORAL_TABLET | ORAL | 0 refills | Status: DC

## 2024-03-23 DIAGNOSIS — N250 Renal osteodystrophy: Secondary | ICD-10-CM | POA: Diagnosis not present

## 2024-03-23 DIAGNOSIS — Z23 Encounter for immunization: Secondary | ICD-10-CM | POA: Diagnosis not present

## 2024-03-23 DIAGNOSIS — D509 Iron deficiency anemia, unspecified: Secondary | ICD-10-CM | POA: Diagnosis not present

## 2024-03-23 DIAGNOSIS — N186 End stage renal disease: Secondary | ICD-10-CM | POA: Diagnosis not present

## 2024-03-23 DIAGNOSIS — Z992 Dependence on renal dialysis: Secondary | ICD-10-CM | POA: Diagnosis not present

## 2024-03-26 DIAGNOSIS — D509 Iron deficiency anemia, unspecified: Secondary | ICD-10-CM | POA: Diagnosis not present

## 2024-03-26 DIAGNOSIS — Z992 Dependence on renal dialysis: Secondary | ICD-10-CM | POA: Diagnosis not present

## 2024-03-26 DIAGNOSIS — N250 Renal osteodystrophy: Secondary | ICD-10-CM | POA: Diagnosis not present

## 2024-03-26 DIAGNOSIS — N186 End stage renal disease: Secondary | ICD-10-CM | POA: Diagnosis not present

## 2024-03-26 DIAGNOSIS — D631 Anemia in chronic kidney disease: Secondary | ICD-10-CM | POA: Diagnosis not present

## 2024-03-27 ENCOUNTER — Other Ambulatory Visit: Payer: Self-pay | Admitting: Cardiovascular Disease

## 2024-03-27 ENCOUNTER — Ambulatory Visit: Admitting: Dermatology

## 2024-03-28 ENCOUNTER — Other Ambulatory Visit: Payer: Self-pay

## 2024-03-28 DIAGNOSIS — D631 Anemia in chronic kidney disease: Secondary | ICD-10-CM | POA: Diagnosis not present

## 2024-03-28 DIAGNOSIS — Z992 Dependence on renal dialysis: Secondary | ICD-10-CM | POA: Diagnosis not present

## 2024-03-28 DIAGNOSIS — N186 End stage renal disease: Secondary | ICD-10-CM | POA: Diagnosis not present

## 2024-03-28 DIAGNOSIS — I25810 Atherosclerosis of coronary artery bypass graft(s) without angina pectoris: Secondary | ICD-10-CM

## 2024-03-28 DIAGNOSIS — N250 Renal osteodystrophy: Secondary | ICD-10-CM | POA: Diagnosis not present

## 2024-03-28 DIAGNOSIS — I480 Paroxysmal atrial fibrillation: Secondary | ICD-10-CM

## 2024-03-28 DIAGNOSIS — D509 Iron deficiency anemia, unspecified: Secondary | ICD-10-CM | POA: Diagnosis not present

## 2024-03-28 DIAGNOSIS — I429 Cardiomyopathy, unspecified: Secondary | ICD-10-CM

## 2024-03-28 DIAGNOSIS — I5022 Chronic systolic (congestive) heart failure: Secondary | ICD-10-CM

## 2024-03-28 DIAGNOSIS — E782 Mixed hyperlipidemia: Secondary | ICD-10-CM

## 2024-03-28 DIAGNOSIS — R0602 Shortness of breath: Secondary | ICD-10-CM

## 2024-03-28 MED ORDER — AMIODARONE HCL 200 MG PO TABS: ORAL_TABLET | ORAL | 0 refills | Status: DC

## 2024-03-29 ENCOUNTER — Other Ambulatory Visit: Payer: Self-pay | Admitting: Cardiology

## 2024-03-29 ENCOUNTER — Other Ambulatory Visit: Payer: Self-pay

## 2024-03-29 ENCOUNTER — Emergency Department

## 2024-03-29 ENCOUNTER — Inpatient Hospital Stay
Admission: EM | Admit: 2024-03-29 | Discharge: 2024-04-03 | DRG: 291 | Disposition: A | Discharge status: Home Health | Attending: Internal Medicine | Admitting: Internal Medicine

## 2024-03-29 DIAGNOSIS — I5023 Acute on chronic systolic (congestive) heart failure: Secondary | ICD-10-CM | POA: Diagnosis not present

## 2024-03-29 DIAGNOSIS — Z79899 Other long term (current) drug therapy: Secondary | ICD-10-CM

## 2024-03-29 DIAGNOSIS — E785 Hyperlipidemia, unspecified: Secondary | ICD-10-CM | POA: Diagnosis present

## 2024-03-29 DIAGNOSIS — N186 End stage renal disease: Secondary | ICD-10-CM | POA: Diagnosis present

## 2024-03-29 DIAGNOSIS — Z9049 Acquired absence of other specified parts of digestive tract: Secondary | ICD-10-CM

## 2024-03-29 DIAGNOSIS — I509 Heart failure, unspecified: Principal | ICD-10-CM

## 2024-03-29 DIAGNOSIS — Z87891 Personal history of nicotine dependence: Secondary | ICD-10-CM | POA: Diagnosis not present

## 2024-03-29 DIAGNOSIS — I420 Dilated cardiomyopathy: Secondary | ICD-10-CM | POA: Diagnosis present

## 2024-03-29 DIAGNOSIS — R54 Age-related physical debility: Secondary | ICD-10-CM | POA: Diagnosis present

## 2024-03-29 DIAGNOSIS — Z66 Do not resuscitate: Secondary | ICD-10-CM | POA: Diagnosis present

## 2024-03-29 DIAGNOSIS — I5022 Chronic systolic (congestive) heart failure: Secondary | ICD-10-CM

## 2024-03-29 DIAGNOSIS — I132 Hypertensive heart and chronic kidney disease with heart failure and with stage 5 chronic kidney disease, or end stage renal disease: Principal | ICD-10-CM | POA: Diagnosis present

## 2024-03-29 DIAGNOSIS — Z992 Dependence on renal dialysis: Secondary | ICD-10-CM | POA: Diagnosis not present

## 2024-03-29 DIAGNOSIS — E1122 Type 2 diabetes mellitus with diabetic chronic kidney disease: Secondary | ICD-10-CM | POA: Diagnosis present

## 2024-03-29 DIAGNOSIS — Z8249 Family history of ischemic heart disease and other diseases of the circulatory system: Secondary | ICD-10-CM

## 2024-03-29 DIAGNOSIS — Z8701 Personal history of pneumonia (recurrent): Secondary | ICD-10-CM

## 2024-03-29 DIAGNOSIS — Z85828 Personal history of other malignant neoplasm of skin: Secondary | ICD-10-CM

## 2024-03-29 DIAGNOSIS — I429 Cardiomyopathy, unspecified: Secondary | ICD-10-CM | POA: Diagnosis not present

## 2024-03-29 DIAGNOSIS — J90 Pleural effusion, not elsewhere classified: Secondary | ICD-10-CM | POA: Diagnosis not present

## 2024-03-29 DIAGNOSIS — Z880 Allergy status to penicillin: Secondary | ICD-10-CM

## 2024-03-29 DIAGNOSIS — Z7902 Long term (current) use of antithrombotics/antiplatelets: Secondary | ICD-10-CM

## 2024-03-29 DIAGNOSIS — I480 Paroxysmal atrial fibrillation: Secondary | ICD-10-CM | POA: Diagnosis not present

## 2024-03-29 DIAGNOSIS — I4821 Permanent atrial fibrillation: Secondary | ICD-10-CM | POA: Diagnosis not present

## 2024-03-29 DIAGNOSIS — I7121 Aneurysm of the ascending aorta, without rupture: Secondary | ICD-10-CM | POA: Diagnosis present

## 2024-03-29 DIAGNOSIS — I25810 Atherosclerosis of coronary artery bypass graft(s) without angina pectoris: Secondary | ICD-10-CM

## 2024-03-29 DIAGNOSIS — M1990 Unspecified osteoarthritis, unspecified site: Secondary | ICD-10-CM | POA: Diagnosis present

## 2024-03-29 DIAGNOSIS — E875 Hyperkalemia: Secondary | ICD-10-CM | POA: Diagnosis present

## 2024-03-29 DIAGNOSIS — Z7984 Long term (current) use of oral hypoglycemic drugs: Secondary | ICD-10-CM | POA: Diagnosis not present

## 2024-03-29 DIAGNOSIS — D631 Anemia in chronic kidney disease: Secondary | ICD-10-CM | POA: Diagnosis present

## 2024-03-29 DIAGNOSIS — I4819 Other persistent atrial fibrillation: Secondary | ICD-10-CM | POA: Diagnosis present

## 2024-03-29 DIAGNOSIS — I252 Old myocardial infarction: Secondary | ICD-10-CM | POA: Diagnosis not present

## 2024-03-29 DIAGNOSIS — Z7982 Long term (current) use of aspirin: Secondary | ICD-10-CM | POA: Diagnosis not present

## 2024-03-29 DIAGNOSIS — I2510 Atherosclerotic heart disease of native coronary artery without angina pectoris: Secondary | ICD-10-CM | POA: Diagnosis present

## 2024-03-29 DIAGNOSIS — E782 Mixed hyperlipidemia: Secondary | ICD-10-CM

## 2024-03-29 DIAGNOSIS — I9589 Other hypotension: Secondary | ICD-10-CM | POA: Diagnosis present

## 2024-03-29 DIAGNOSIS — Z96641 Presence of right artificial hip joint: Secondary | ICD-10-CM | POA: Diagnosis present

## 2024-03-29 DIAGNOSIS — Z79890 Hormone replacement therapy (postmenopausal): Secondary | ICD-10-CM | POA: Diagnosis not present

## 2024-03-29 DIAGNOSIS — I5043 Acute on chronic combined systolic (congestive) and diastolic (congestive) heart failure: Secondary | ICD-10-CM | POA: Diagnosis present

## 2024-03-29 DIAGNOSIS — I2489 Other forms of acute ischemic heart disease: Secondary | ICD-10-CM | POA: Diagnosis present

## 2024-03-29 DIAGNOSIS — Z951 Presence of aortocoronary bypass graft: Secondary | ICD-10-CM

## 2024-03-29 DIAGNOSIS — I712 Thoracic aortic aneurysm, without rupture, unspecified: Secondary | ICD-10-CM | POA: Diagnosis present

## 2024-03-29 DIAGNOSIS — Z888 Allergy status to other drugs, medicaments and biological substances status: Secondary | ICD-10-CM

## 2024-03-29 DIAGNOSIS — I700 Atherosclerosis of aorta: Secondary | ICD-10-CM | POA: Diagnosis not present

## 2024-03-29 DIAGNOSIS — I482 Chronic atrial fibrillation, unspecified: Secondary | ICD-10-CM | POA: Diagnosis present

## 2024-03-29 DIAGNOSIS — I959 Hypotension, unspecified: Secondary | ICD-10-CM | POA: Diagnosis not present

## 2024-03-29 DIAGNOSIS — I4891 Unspecified atrial fibrillation: Secondary | ICD-10-CM | POA: Diagnosis not present

## 2024-03-29 DIAGNOSIS — E119 Type 2 diabetes mellitus without complications: Secondary | ICD-10-CM

## 2024-03-29 DIAGNOSIS — Z955 Presence of coronary angioplasty implant and graft: Secondary | ICD-10-CM

## 2024-03-29 DIAGNOSIS — Z7901 Long term (current) use of anticoagulants: Secondary | ICD-10-CM

## 2024-03-29 DIAGNOSIS — I110 Hypertensive heart disease with heart failure: Secondary | ICD-10-CM | POA: Diagnosis not present

## 2024-03-29 DIAGNOSIS — R0602 Shortness of breath: Secondary | ICD-10-CM

## 2024-03-29 LAB — CBC
HCT: 38.6 % — ABNORMAL LOW (ref 39.0–52.0)
Hemoglobin: 12.5 g/dL — ABNORMAL LOW (ref 13.0–17.0)
MCH: 31.6 pg (ref 26.0–34.0)
MCHC: 32.4 g/dL (ref 30.0–36.0)
MCV: 97.7 fL (ref 80.0–100.0)
Platelets: 85 K/uL — ABNORMAL LOW (ref 150–400)
RBC: 3.95 MIL/uL — ABNORMAL LOW (ref 4.22–5.81)
RDW: 14.6 % (ref 11.5–15.5)
WBC: 5.9 K/uL (ref 4.0–10.5)
nRBC: 0 % (ref 0.0–0.2)

## 2024-03-29 LAB — CBG MONITORING, ED: Glucose-Capillary: 117 mg/dL — ABNORMAL HIGH (ref 70–99)

## 2024-03-29 LAB — BASIC METABOLIC PANEL WITH GFR
Anion gap: 13 (ref 5–15)
BUN: 44 mg/dL — ABNORMAL HIGH (ref 8–23)
CO2: 25 mmol/L (ref 22–32)
Calcium: 9.1 mg/dL (ref 8.9–10.3)
Chloride: 95 mmol/L — ABNORMAL LOW (ref 98–111)
Creatinine, Ser: 2.36 mg/dL — ABNORMAL HIGH (ref 0.61–1.24)
GFR, Estimated: 25 mL/min — ABNORMAL LOW (ref >60)
Glucose, Bld: 121 mg/dL — ABNORMAL HIGH (ref 70–99)
Potassium: 4.2 mmol/L (ref 3.5–5.1)
Sodium: 133 mmol/L — ABNORMAL LOW (ref 135–145)

## 2024-03-29 LAB — TROPONIN I (HIGH SENSITIVITY)
Troponin I (High Sensitivity): 52 ng/L — ABNORMAL HIGH (ref <18)
Troponin I (High Sensitivity): 52 ng/L — ABNORMAL HIGH (ref <18)

## 2024-03-29 LAB — BRAIN NATRIURETIC PEPTIDE: B Natriuretic Peptide: 746.2 pg/mL — ABNORMAL HIGH (ref 0.0–100.0)

## 2024-03-29 LAB — PROCALCITONIN: Procalcitonin: <0.1 ng/mL

## 2024-03-29 MED ORDER — FUROSEMIDE 10 MG/ML IJ SOLN
40.0000 mg | Freq: Two times a day (BID) | INTRAMUSCULAR | Status: DC
  Administered 2024-03-30 – 2024-04-03 (×7): 40 mg via INTRAVENOUS
  Filled 2024-03-29 (×7): qty 4

## 2024-03-29 MED ORDER — ACETAMINOPHEN 650 MG RE SUPP
650.0000 mg | Freq: Four times a day (QID) | RECTAL | Status: DC | PRN
Start: 2024-03-29 — End: 2024-04-03

## 2024-03-29 MED ORDER — ACETAMINOPHEN 325 MG PO TABS: 650.0000 mg | ORAL_TABLET | Freq: Four times a day (QID) | ORAL | Status: DC | PRN

## 2024-03-29 MED ORDER — FUROSEMIDE 10 MG/ML IJ SOLN
40.0000 mg | Freq: Once | INTRAMUSCULAR | Status: AC
  Administered 2024-03-29: 40 mg via INTRAVENOUS
  Filled 2024-03-29: qty 4

## 2024-03-29 MED ORDER — HYDROCODONE-ACETAMINOPHEN 5-325 MG PO TABS: 1.0000 | ORAL_TABLET | ORAL | Status: DC | PRN

## 2024-03-29 MED ORDER — HEPARIN SODIUM (PORCINE) 5000 UNIT/ML IJ SOLN
5000.0000 [IU] | Freq: Three times a day (TID) | INTRAMUSCULAR | Status: DC
  Administered 2024-03-30 – 2024-04-01 (×7): 5000 [IU] via SUBCUTANEOUS
  Filled 2024-03-29 (×7): qty 1

## 2024-03-29 MED ORDER — INSULIN ASPART 100 UNIT/ML IJ SOLN
0.0000 [IU] | Freq: Three times a day (TID) | INTRAMUSCULAR | Status: DC
  Administered 2024-03-31 – 2024-04-01 (×4): 1 [IU] via SUBCUTANEOUS
  Filled 2024-03-29 (×3): qty 1

## 2024-03-29 MED ORDER — ONDANSETRON HCL 4 MG/2ML IJ SOLN: 4.0000 mg | Freq: Four times a day (QID) | INTRAMUSCULAR | Status: DC | PRN

## 2024-03-29 MED ORDER — ONDANSETRON HCL 4 MG PO TABS: 4.0000 mg | ORAL_TABLET | Freq: Four times a day (QID) | ORAL | Status: DC | PRN

## 2024-03-29 MED ORDER — AMIODARONE HCL 200 MG PO TABS
ORAL_TABLET | ORAL | 0 refills | Status: DC
Start: 2024-03-29 — End: 2024-04-03

## 2024-03-29 MED ORDER — INSULIN ASPART 100 UNIT/ML IJ SOLN
0.0000 [IU] | Freq: Every day | INTRAMUSCULAR | Status: DC
  Administered 2024-04-01: 0 [IU] via SUBCUTANEOUS
  Filled 2024-03-29: qty 1

## 2024-03-29 NOTE — Assessment & Plan Note (Signed)
 Elevated troponin (flat trend 52-> 52) No complaints of chest pain, EKG nonacute and troponins slightly elevated but flat 52-52 likely demand ischemia Continue aspirin  and rosuvastatin  as well as nitroglycerin  sublingual as needed chest pain Echocardiogram being ordered so will evaluate for segmental wall motion abnormality

## 2024-03-29 NOTE — Assessment & Plan Note (Signed)
 6.8 cm.  Recommendation for semiannual imaging

## 2024-03-29 NOTE — Assessment & Plan Note (Signed)
 Nephrology consulted for continuation of dialysis ?

## 2024-03-29 NOTE — ED Provider Notes (Signed)
 [facility] Provider Note    Event Date/Time   First MD Initiated Contact with Patient 03/29/24 1839     (approximate)   History   Weakness   HPI  Matthew [name] is a 89 y.o. male with a history of atrial fibrillation (on metoprolol  as well as Plavix ), CAD, HFrEF, hypertension, hyperlipidemia, ESRD on HD, and GERD who presents with increased shortness of breath approximate the last week, persistent course, worse with exertion.  The patient denies any cough or fever.  He has no chest pain.  He feels that his atrial fibrillation is acting up.  The patient was seen by his cardiologist late last month with these symptoms and was to be started back on amiodarone  which she had been on previously.  However, the pharmacist was apparently concerned about the dosing and nobody was able to get in touch with one of his providers, so he has not been on it since seeing the cardiologist.  The patient denies any new or worsening leg swelling.  He is compliant with his medications and with dialysis.  I reviewed the past medical records.  The patient's most recent outpatient counter was on 10/28 with cardiology for follow-up.  He reported shortness of breath at that time and was recommended for an echocardiogram.   Physical Exam   Triage Vital Signs: ED Triage Vitals  Encounter Vitals Group     BP 03/29/24 1548 98/68     Girls Systolic BP Percentile --      Girls Diastolic BP Percentile --      Boys Systolic BP Percentile --      Boys Diastolic BP Percentile --      Pulse Rate 03/29/24 1549 99     Resp 03/29/24 1549 (!) 26     Temp 03/29/24 1548 98.1 F (36.7 C)     Temp Source 03/29/24 1548 Oral     SpO2 03/29/24 1549 96 %     Weight --      Height --      Head Circumference --      Peak Flow --      Pain Score 03/29/24 1548 0     Pain Loc --      Pain Education --      Exclude from Growth Chart --     Most recent vital signs: Vitals:   03/29/24 2200 03/29/24  2230  BP: 122/81 117/89  Pulse: (!) 105 (!) 108  Resp: 18 18  Temp:    SpO2: 98% 99%     General: Awake, no distress.  CV:  Good peripheral perfusion.  Resp:  Normal effort.  Lungs CTAB. Abd:  No distention.  Other:  Trace bilateral lower extremity edema.   ED Results / Procedures / Treatments   Labs (all labs ordered are listed, but only abnormal results are displayed) Labs Reviewed  BASIC METABOLIC PANEL WITH GFR - Abnormal; Notable for the following components:      Result Value   Sodium 133 (*)    Chloride 95 (*)    Glucose, Bld 121 (*)    BUN 44 (*)    Creatinine, Ser 2.36 (*)    GFR, Estimated 25 (*)    All other components within normal limits  CBC - Abnormal; Notable for the following components:   RBC 3.95 (*)    Hemoglobin 12.5 (*)    HCT 38.6 (*)    Platelets 85 (*)    All other components  within normal limits  BRAIN NATRIURETIC PEPTIDE - Abnormal; Notable for the following components:   B Natriuretic Peptide 746.2 (*)    All other components within normal limits  TROPONIN I (HIGH SENSITIVITY) - Abnormal; Notable for the following components:   Troponin I (High Sensitivity) 52 (*)    All other components within normal limits  TROPONIN I (HIGH SENSITIVITY) - Abnormal; Notable for the following components:   Troponin I (High Sensitivity) 52 (*)    All other components within normal limits  PROCALCITONIN     EKG  ED ECG REPORT I, Waylon Cassis, the attending physician, personally viewed and interpreted this ECG.  Date: 03/29/2024 EKG Time: 1602 Rate: 94 Rhythm: Atrial fibrillation QRS Axis: normal Intervals: normal ST/T Wave abnormalities: Nonspecific ST abnormality Narrative Interpretation: no evidence of acute ischemia    RADIOLOGY  Chest x-ray: I independently viewed and interpreted the images; there are trace bilateral pleural effusions with no edema; there is some atelectasis versus consolidation at the left  base  PROCEDURES:  Critical Care performed: No  Procedures   MEDICATIONS ORDERED IN ED: Medications  furosemide  (LASIX ) injection 40 mg (40 mg Intravenous Given 03/29/24 2246)     IMPRESSION / MDM / ASSESSMENT AND PLAN / ED COURSE  I reviewed the triage vital signs and the nursing notes.  89 year old male with PMH as noted above presents with increasing shortness of breath.  On exam his vital signs are normal.  He is relatively well-appearing for his age.  Physical exam is unrevealing in terms of any acute findings.  Chest x-ray does show some possible left lower lobe opacity.  BMP and CBC are unremarkable.  Differential diagnosis includes, but is not limited to, fluid overload related to atrial fibrillation and/or CHF, ESRD, pneumonia, bronchitis, viral syndrome.  I have added on troponin, BNP, procalcitonin as well as a CT chest to further evaluate the x-ray finding.  Patient's presentation is most consistent with acute presentation with potential threat to life or bodily function.  The patient is on the cardiac monitor to evaluate for evidence of arrhythmia and/or significant heart rate changes.  ----------------------------------------- 10:52 PM on 03/29/2024 -----------------------------------------  CT chest shows no evidence of pneumonia but does confirm bilateral pleural effusions.  BNP is elevated.  Procalcitonin is negative.  Overall presentation favors CHF exacerbation.  The CT also showed evidence of a 6.8 cm ascending thoracic aortic aneurysm.  I discussed this with Dr. Jama who recommends routine follow-up with no acute intervention.  However, on further discussion with the patient it turns out that he has a known aortic dissection which she is anticoagulated for.  I ordered IV Lasix .  I consulted Dr. Cleatus from the hospitalist service; based on our discussion she agrees to evaluate the patient for admission.   FINAL CLINICAL IMPRESSION(S) / ED DIAGNOSES    Final diagnoses:  Acute on chronic congestive heart failure, unspecified heart failure type (HCC)     Rx / DC Orders   ED Discharge Orders     None        Note:  This document was prepared using Dragon voice recognition software and may include unintentional dictation errors.    Cassis Waylon, MD 03/29/24 2253

## 2024-03-29 NOTE — Assessment & Plan Note (Signed)
 Bilateral pleural effusion Patient with shortness of breath appears mildly fluid overloaded, has elevated BNP of 746 Should improve with dialysis in the a.m. Will continue Lasix  40 mg twice daily as patient still produces urine Daily weights with intake and output monitoring Getting updated echocardiogram [facility]iology has been [facility]

## 2024-03-29 NOTE — ED Triage Notes (Signed)
 Patient was recently prescribed Amiodarone  for A-fib but pharmacy has been questioning order and not filled medication. Patient complaining of generalized weakness.

## 2024-03-29 NOTE — Assessment & Plan Note (Addendum)
 Mildly increased ventricular rate, 99-110 Continue aspirin  .Not currently on anticoagulation Plan by cardiology to restart amiodarone , not yet filled Will hold off on starting amiodarone  pending cardiology consult

## 2024-03-29 NOTE — Assessment & Plan Note (Signed)
 Sliding scale insulin  coverage

## 2024-03-29 NOTE — Hospital Course (Signed)
 SABRA

## 2024-03-29 NOTE — H&P (Signed)
 History and Physical    Patient: Matthew [name] FMW:[id number] DOB: 17-Jan-1931 DOA: 03/29/2024 DOS: the patient was seen and examined on 03/29/2024 PCP: Albina GORMAN Dine, MD  Patient coming from: Home  Chief Complaint:  Chief Complaint  Patient presents with   Weakness    HPI: Matthew [name] is a 89 y.o. male with medical history significant for CAD s/p CABG and stents, ESRD on HD MWF, atrial fibrillation(not on [facility], previously on amiodarone ), ascending aortic aneurysm, HFrEF(EF 30-35% 12/2022), HTN, HLD,DM, being admitted for CHF exacerbation after presenting with a history of worsening shortness of breath, generalized weakness and occasional palpitations.  He denies chest pain.  He recently saw his cardiologist, Dr. Fernand who ordered an echocardiogram (scheduled for 04/03/24) and  placed him back on amiodarone  but it has not yet been filled (note not found in Care Everywhere) In the ED, afebrile, heart rate 99-108.  O2 sats 98% on room air and normotensive. Labs notable for troponin 52-> 52 and BNP 746 CBC notable for hemoglobin 12.5, otherwise unremarkable BMP for the most part normal per dialysis status EKG showed A-fib at 94 with nonspecific ST-T wave changes CT chest without contrast showing cardiomegaly, small bilateral pleural effusions and known 6.8 cm ascending thoracic aortic aneurysm with recommendation for semiannual imaging  Patient treated with IV Lasix  40 mg for CHF exacerbation  Admission requested.    Past Medical History:  Diagnosis Date   Anginal pain    Arthritis    Ascending aortic aneurysm 07/18/2015   a.) TTE 07/18/2015: severe dilation of ascending aorta measuring 7.0 cm. b.) TTE 01/10/2018: aortic root 3.8 cm; ascending aorta 6.8 cm; refused surgical intervention/repair.   Atrial fibrillation (HCC)    a.) CHA2DS2-VASc = 6 (age x 2, HFrEF, HTN, previous MI, T2DM). b.) rate/rhythm maintained on amiodarone  + metoprolol  succinate; chronic antiplatelet therapy  using clopidogrel    Bradycardia    Coronary artery disease    ESRD (end stage renal disease) (HCC)    GERD (gastroesophageal reflux disease)    HFrEF (heart failure with reduced ejection fraction) (HCC)    a.) TTE 12/03/2013: mod dec LV function; EF 35-40%; severe inferolateral and inferior HK; LA dilated; G3DD; PASP 52 mmHg. b.) TTE 07/18/2015: mild LV dysfunction with mild LVH; EF 45%; mild BAE. 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 c.) TTE 01/10/2018: mod AR/TR   Past Surgical History:  Procedure Laterality Date   A/V FISTULAGRAM Left 02/09/2023   Procedure: A/V Fistulagram;  Surgeon: Marea Selinda GORMAN, MD;  Location: [facility];  Service: Cardiovascular;  Laterality: Left;   A/V SHUNT INTERVENTION N/A 01/25/2023   Procedure: A/V SHUNT INTERVENTION;  Surgeon: Jama Cordella MATSU, MD;  Location: [facility];  Service: Cardiovascular;  Laterality: N/A;   A/V SHUNTOGRAM  Left 04/01/2022   Procedure: A/V SHUNTOGRAM;  Surgeon: Marea Selinda RAMAN, MD;  Location: [facility];  Service: Cardiovascular;  Laterality: Left;   AV FISTULA PLACEMENT Left 04/15/2021   Procedure: INSERTION OF ARTERIOVENOUS (AV) GORE-TEX GRAFT ARM ( BRACHIAL AXILLARY);  Surgeon: Marea Selinda RAMAN, MD;  Location: [facility];  Service: Vascular;  Laterality: Left;   CATARACT EXTRACTION W/PHACO  Left 07/26/2017   Procedure: CATARACT EXTRACTION PHACO AND INTRAOCULAR LENS PLACEMENT (IOC);  Surgeon: Jaye Fallow, MD;  Location: [facility];  Service: Ophthalmology;  Laterality: Left;  US    00:45.8 AP%  13.1 CDE  6.00 Fluid Pack Lot # [id number]   CATARACT EXTRACTION W/PHACO Right 08/17/2017   Procedure: CATARACT EXTRACTION PHACO AND INTRAOCULAR LENS PLACEMENT (IOC);  Surgeon: Jaye Fallow, MD;  Location: [facility];  Service: Ophthalmology;  Laterality: Right;  US   01:30 AP% 16.8 CDE 15.24 Fluid pack lot # [id number]   CHOLECYSTECTOMY     CORONARY ARTERY BYPASS GRAFT N/A 2001   Procedure: 5v CABG (LIMA-LAD, SVG-D1, SVG-OM1, SVG-OM2, SVG-PDA)   DIALYSIS/PERMA CATHETER INSERTION N/A 02/09/2018   Procedure: DIALYSIS/PERMA CATHETER INSERTION;  Surgeon: Marea Selinda RAMAN, MD;  Location: [facility];  Service: Cardiovascular;  Laterality: N/A;   DIALYSIS/PERMA CATHETER INSERTION N/A 02/09/2023   Procedure: DIALYSIS/PERMA CATHETER INSERTION;  Surgeon: Marea Selinda RAMAN, MD;  Location: [facility];  Service: Cardiovascular;  Laterality: N/A;   DIALYSIS/PERMA CATHETER INSERTION N/A 11/28/2023   Procedure: DIALYSIS/PERMA CATHETER INSERTION;  Surgeon: Marea Selinda RAMAN, MD;  Location: [facility];  Service: Cardiovascular;  Laterality: N/A;   DIALYSIS/PERMA CATHETER REMOVAL N/A 06/25/2021   Procedure: DIALYSIS/PERMA CATHETER REMOVAL;  Surgeon: Marea Selinda RAMAN, MD;  Location: [facility];  Service: Cardiovascular;  Laterality: N/A;   FRACTURE SURGERY Left 2015   LEFT arm   INSERTION OF DIALYSIS CATHETER Right 11/03/2020   Procedure: INSERTION OF Perm Cath in the RIGHT INTERNAL JUGULAR;  Surgeon: Marea Selinda RAMAN, MD;  Location: [facility];  Service: Vascular;  Laterality: Right;   INTRAMEDULLARY (IM) NAIL INTERTROCHANTERIC Right 11/12/2022   Procedure: INTRAMEDULLARY (IM) NAIL INTERTROCHANTERIC;  Surgeon: Marchia Drivers, MD;  Location: [facility];  Service: Orthopedics;  Laterality: Right;    LEFT HEART CATHETERIZATION WITH CORONARY/GRAFT ANGIOGRAM Left 12/04/2013   Procedure: LEFT HEART CATHETERIZATION WITH EL BILE;  Surgeon: Alm LELON Clay, MD;  Location: [facility] CATH LAB;  Service: Cardiovascular;  Laterality: Left;   LEFT HEART CATHETERIZATION WITH CORONARY/GRAFT ANGIOGRAM Left 02/06/2009   Procedure: LEFT HEART CATHETERIZATION WITH CORONARY/GRAFT ANGIOGRAM; Location: [facility]; Surgeon: Vita Bathe, MD   PERCUTANEOUS CORONARY STENT INTERVENTION (PCI-S) N/A 12/06/2013   Procedure: STAGED PERCUTANEOUS CORONARY STENT INTERVENTION (overlapping 4.0 x 38 mm (mid) and 4.0 x 28 mm (ostial) Promus Primier DES to SVG-RPDA graft);  Surgeon: Victory LELON Claudene DOUGLAS, MD;  Location: [facility] CATH LAB;  Service: Cardiovascular   PERIPHERAL VASCULAR THROMBECTOMY Left 08/31/2023   Procedure: PERIPHERAL VASCULAR THROMBECTOMY;  Surgeon: Jama Cordella MATSU, MD;  Location: [facility];  Service: Cardiovascular;  Laterality: Left;   REMOVAL OF A DIALYSIS CATHETER N/A 11/03/2020   Procedure: REMOVAL OF A DIALYSIS CATHETER;  Surgeon: Marea Selinda RAMAN, MD;  Location: [facility];  Service: Vascular;  Laterality: N/A;   TEMPORARY DIALYSIS CATHETER N/A 02/04/2023   Procedure: TEMPORARY DIALYSIS CATHETER;  Surgeon: Jama Cordella MATSU, MD;  Location: [facility];  Service: Cardiovascular;  Laterality: N/A;   TOTAL HIP REVISION Right 01/12/2023   Procedure: CONVERSION TO  RIGHT TOTAL HIP REPLACEMENT;  Surgeon: Edna Toribio LABOR, MD;  Location: [facility];  Service: Orthopedics;  Laterality: Right;   Social History:  reports that he quit smoking about 33 years ago. His smoking use included pipe and cigarettes. He has never used smokeless tobacco. He reports that he does not currently use alcohol after a past usage of about 1.0 standard drink of alcohol per week. He reports that he does not use drugs.  Allergies  Allergen Reactions   Statins     Other Reaction(s): Unknown   Amoxicillin Diarrhea     Family History  Problem Relation Age of Onset   Heart attack Father 60       died first MI at age 68   Heart failure Mother     Prior to Admission medications   Medication Sig Start Date End Date Taking? Authorizing Provider  aspirin  EC 81 MG tablet Take 81 mg by mouth daily. Swallow whole.   Yes [provider]  calcium  carbonate (TUMS EX) 750 MG chewable tablet Chew 1 tablet by mouth 3 (three) times daily.   Yes [provider]  cholecalciferol  (VITAMIN D ) 25 MCG (1000 UNIT) tablet Take 1,000 Units by mouth daily with breakfast.   Yes [provider]  clopidogrel  (PLAVIX ) 75 MG tablet TAKE 1 TABLET BY MOUTH DAILY 03/28/24  Yes Fernand Denyse LABOR, MD  ferrous sulfate  325 (65 FE) MG tablet Take 325 mg by mouth 2 (two) times daily with a meal.    Yes [provider]  fluticasone  (FLONASE ) 50 MCG/ACT nasal spray Place 2 sprays into both nostrils daily as needed for allergies or rhinitis. 03/20/24  Yes Fernand Denyse LABOR, MD  furosemide  (LASIX ) 20 MG tablet Take 1 tablet (20 mg total) by mouth daily. 11/17/23 11/16/24 Yes Fernand Denyse LABOR, MD  glipiZIDE  (GLUCOTROL ) 5 MG tablet Take 2.5 mg by mouth daily before breakfast. 10/04/23  Yes [provider]  levothyroxine  (SYNTHROID ) 100 MCG tablet TAKE 1 TABLET BY MOUTH IN THE  MORNING 11/01/23  Yes Tejan-Sie, GORMAN Dine, MD  multivitamin (RENA-VIT) TABS tablet Take 1 tablet by mouth daily.   Yes [provider]  nitroGLYCERIN  (NITROSTAT ) 0.4 MG SL tablet DISSOLVE 1 TABLET UNDER THE  TONGUE EVERY 5 MINUTES AS NEEDED FOR CHEST PAIN. MAX OF 3 TABLETS IN 15 MINUTES. CALL 911 IF PAIN  PERSISTS. 03/28/24  Yes Fernand Denyse LABOR, MD  Omega-3 Fatty Acids (FISH OIL ) 1000 MG CAPS Take 4,000 mg by mouth daily with lunch.   Yes [provider]  potassium chloride  SA (KLOR-CON  M) 20 MEQ tablet Take 20 mEq by mouth daily.   Yes [provider]  rosuvastatin  (CRESTOR ) 40 MG tablet Take 1 tablet (40 mg total)  by mouth daily. 10/04/23  Yes Albina GORMAN Dine, MD  amiodarone  (PACERONE ) 200 MG tablet Take 4 tablets/day for 1 week, then 3 tablets/day for 1 week, then 2 tablets/day for 1 week. Then follow up. 03/29/24   Scoggins, Amber, NP  cetirizine (ZYRTEC) 10 MG tablet Take 10 mg by mouth daily. Patient not taking: Reported on 03/20/2024    [provider]  midodrine  (PROAMATINE ) 10 MG tablet See administration instructions. Take 10 mg by mouth 1 hour before dialysis and 30 minutes before procedure ends. 01/15/21   [provider]    Physical Exam: Vitals:   03/29/24 1858 03/29/24 2145 03/29/24 2200 03/29/24 2230  BP:  116/79 122/81 117/89  Pulse:  (!) 105 (!) 105 (!) 108  Resp:  18 18  Temp:      TempSrc:      SpO2: 97% 97% 98% 99%   Physical Exam  Labs on Admission: I have personally reviewed following labs and imaging studies  CBC: Recent Labs  Lab 03/29/24 1552  WBC 5.9  HGB 12.5*  HCT 38.6*  MCV 97.7  PLT 85*   Basic Metabolic Panel: Recent Labs  Lab 03/29/24 1552  NA 133*  K 4.2  CL 95*  CO2 25  GLUCOSE 121*  BUN 44*  CREATININE 2.36*  CALCIUM  9.1   GFR: Estimated Creatinine Clearance: 21.1 mL/min (A) (by C-G formula based on SCr of 2.36 mg/dL (H)). Liver Function Tests: No results for input(s): AST, ALT, ALKPHOS, BILITOT, PROT, ALBUMIN  in the last 168 hours. No results for input(s): LIPASE, AMYLASE in the last 168 hours. No results for input(s): AMMONIA in the last 168 hours. Coagulation Profile: No results for input(s): INR, PROTIME in the last 168 hours. Cardiac Enzymes: No results for input(s): CKTOTAL, CKMB, CKMBINDEX, TROPONINI in the last 168 hours. BNP (last 3 results) No results for input(s): PROBNP in the last 8760 hours. HbA1C: No results for input(s): HGBA1C in the last 72 hours. CBG: No results for input(s): GLUCAP in the last 168 hours. Lipid Profile: No results for input(s): CHOL, HDL,  LDLCALC, TRIG, CHOLHDL, LDLDIRECT in the last 72 hours. Thyroid  Function Tests: No results for input(s): TSH, T4TOTAL, FREET4, T3FREE, THYROIDAB in the last 72 hours. Anemia Panel: No results for input(s): VITAMINB12, FOLATE, FERRITIN, TIBC, IRON, RETICCTPCT in the last 72 hours. Urine analysis:    Component Value Date/Time   COLORURINE YELLOW (A) 05/24/2019 0021   APPEARANCEUR CLEAR (A) 05/24/2019 0021   LABSPEC 1.012 05/24/2019 0021   PHURINE 6.0 05/24/2019 0021   GLUCOSEU NEGATIVE 05/24/2019 0021   HGBUR NEGATIVE 05/24/2019 0021   BILIRUBINUR Negative 10/15/2022 1202   KETONESUR NEGATIVE 05/24/2019 0021   PROTEINUR Positive (A) 10/15/2022 1202   PROTEINUR 30 (A) 05/24/2019 0021   UROBILINOGEN 0.2 10/15/2022 1202   NITRITE Negative 10/15/2022 1202   NITRITE NEGATIVE 05/24/2019 0021   LEUKOCYTESUR Large (3+) (A) 10/15/2022 1202   LEUKOCYTESUR NEGATIVE 05/24/2019 0021    Radiological Exams on Admission: CT CHEST WO CONTRAST Result Date: 03/29/2024 CLINICAL DATA:  Respiratory illness, nondiagnostic x-ray, atrial fibrillation, generalized weakness EXAM: CT CHEST WITHOUT CONTRAST TECHNIQUE: Multidetector CT imaging of the chest was performed following the standard protocol without IV contrast. RADIATION DOSE REDUCTION: This exam was performed according to the departmental dose-optimization program which includes automated exposure control, adjustment of the mA and/or kV according to patient size and/or use of iterative reconstruction technique. COMPARISON:  03/29/2024 FINDINGS: Cardiovascular: Unenhanced imaging of the heart demonstrates cardiomegaly without pericardial effusion. There is a 6.8 cm ascending thoracic aortic aneurysm. Assessment of the aortic lumen cannot be performed without intravenous contrast. Extensive atherosclerosis throughout the aorta and native coronary vasculature. Postsurgical changes from previous CABG. Right internal jugular catheter  tip within the superior vena cava. Mediastinum/Nodes: No enlarged mediastinal or axillary lymph nodes. Thyroid  gland, trachea, and esophagus demonstrate no significant findings. Lungs/Pleura: Small bilateral pleural effusions and dependent atelectasis. No airspace disease or pneumothorax. The central airways are patent. Upper Abdomen: No acute abnormality. Musculoskeletal: No acute or destructive bony abnormalities. Diffuse thoracic spondylosis. Reconstructed images demonstrate no additional findings. IMPRESSION: 1. 6.8 cm ascending thoracic aortic aneurysm. Assessment of the aortic lumen cannot be performed without intravenous contrast. Recommend semi-annual imaging followup by CTA or MRA and referral to cardiothoracic  surgery if not already obtained. This recommendation follows 2010 ACCF/AHA/AATS/ACR/ASA/SCA/SCAI/SIR/STS/SVM Guidelines for the Diagnosis and Management of Patients With Thoracic Aortic Disease. Circulation. 2010; 121: [id number]. Aortic aneurysm NOS (ICD10-I71.9) 2. Cardiomegaly without pericardial effusion. 3. Small bilateral pleural effusions with dependent lower lobe atelectasis. 4. Aortic Atherosclerosis (ICD10-I70.0). Coronary artery atherosclerosis. Electronically Signed   By: Ozell Daring M.D.   On: 03/29/2024 20:18   DG Chest 2 View Result Date: 03/29/2024 CLINICAL DATA:  Shortness of breath EXAM: CHEST - 2 VIEW COMPARISON:  Chest radiograph dated 01/10/2023. FINDINGS: Right-sided dialysis catheter with tip over central SVC. Trace bilateral pleural effusions and left lung base atelectasis/scarring. Pneumonia is not excluded. No pneumothorax. Stable cardiac silhouette. Median sternotomy wires. Left axillary vascular stent. No acute osseous pathology. IMPRESSION: Trace bilateral pleural effusions and left lung base atelectasis/scarring. Pneumonia is not excluded. Electronically Signed   By: Vanetta Chou M.D.   On: 03/29/2024 16:29   Data Reviewed for HPI: Relevant notes from  primary care and specialist visits, past discharge summaries as available in EHR, including Care Everywhere. Prior diagnostic testing as pertinent to current admission diagnoses Updated medications and problem lists for reconciliation ED course, including vitals, labs, imaging, treatment and response to treatment Triage notes, nursing and pharmacy notes and ED provider's notes Notable results as noted above in HPI      Assessment and Plan: * Acute on chronic HFrEF (heart failure with reduced ejection fraction) (HCC) Bilateral pleural effusion Patient with shortness of breath appears mildly fluid overloaded, has elevated BNP of 746 Should improve with dialysis in the a.m. Will continue Lasix  40 mg twice daily as patient still produces urine Daily weights with intake and output monitoring Getting updated echocardiogram Cardiology has been consulted-[facility]  Chronic atrial fibrillation with RVR (HCC) Mildly increased ventricular rate, 99-110 Continue aspirin  .Not currently on anticoagulation Plan by cardiology to restart amiodarone , not yet filled Will hold off on starting amiodarone  pending cardiology consult  CAD with history of CABG and stents (coronary artery disease) Elevated troponin (flat trend 52-> 52) No complaints of chest pain, EKG nonacute and troponins slightly elevated but flat 52-52 likely demand ischemia Continue aspirin  and rosuvastatin  as well as nitroglycerin  sublingual as needed chest pain Echocardiogram being ordered so will evaluate for segmental wall motion abnormality  ESRD on dialysis [facility]) Nephrology consulted for continuation of dialysis  Diabetes mellitus, type II (HCC) Sliding scale insulin  coverage  Aneurysm of thoracic aorta 6.8 cm.  Recommendation for semiannual imaging    DVT prophylaxis: heparin   Consults: [facility]-Dr. Florencio covering  Advance Care Planning:   Code Status: Prior   Family Communication: none  Disposition  Plan: Back to previous home environment  Severity of Illness: The appropriate patient status for this patient is OBSERVATION. Observation status is judged to be reasonable and necessary in order to provide the required intensity of service to ensure the patient's safety. The patient's presenting symptoms, physical exam findings, and initial radiographic and laboratory data in the context of their medical condition is felt to place them at decreased risk for further clinical deterioration. Furthermore, it is anticipated that the patient will be medically stable for discharge from the hospital within 2 midnights of admission.   Author: Delayne LULLA Solian, MD 03/29/2024 11:14 PM  For on call review [email or web address].

## 2024-03-30 ENCOUNTER — Observation Stay
Admit: 2024-03-30 | Discharge: 2024-03-30 | Disposition: A | Discharge status: Home / Self Care | Attending: Internal Medicine | Admitting: Internal Medicine

## 2024-03-30 DIAGNOSIS — D631 Anemia in chronic kidney disease: Secondary | ICD-10-CM | POA: Diagnosis not present

## 2024-03-30 DIAGNOSIS — N186 End stage renal disease: Secondary | ICD-10-CM | POA: Diagnosis not present

## 2024-03-30 DIAGNOSIS — I5023 Acute on chronic systolic (congestive) heart failure: Secondary | ICD-10-CM | POA: Diagnosis not present

## 2024-03-30 LAB — CBC
HCT: 39.4 % (ref 39.0–52.0)
Hemoglobin: 12.9 g/dL — ABNORMAL LOW (ref 13.0–17.0)
MCH: 31.5 pg (ref 26.0–34.0)
MCHC: 32.7 g/dL (ref 30.0–36.0)
MCV: 96.3 fL (ref 80.0–100.0)
Platelets: 85 K/uL — ABNORMAL LOW (ref 150–400)
RBC: 4.09 MIL/uL — ABNORMAL LOW (ref 4.22–5.81)
RDW: 14.6 % (ref 11.5–15.5)
WBC: 5.4 K/uL (ref 4.0–10.5)
nRBC: 0 % (ref 0.0–0.2)

## 2024-03-30 LAB — RENAL FUNCTION PANEL
Albumin: 3.5 g/dL (ref 3.5–5.0)
Anion gap: 14 (ref 5–15)
BUN: 20 mg/dL (ref 8–23)
CO2: 24 mmol/L (ref 22–32)
Calcium: 9 mg/dL (ref 8.9–10.3)
Chloride: 97 mmol/L — ABNORMAL LOW (ref 98–111)
Creatinine, Ser: 1.44 mg/dL — ABNORMAL HIGH (ref 0.61–1.24)
GFR, Estimated: 46 mL/min — ABNORMAL LOW (ref >60)
Glucose, Bld: 118 mg/dL — ABNORMAL HIGH (ref 70–99)
Phosphorus: 2.3 mg/dL — ABNORMAL LOW (ref 2.5–4.6)
Potassium: 5 mmol/L (ref 3.5–5.1)
Sodium: 135 mmol/L (ref 135–145)

## 2024-03-30 LAB — HEPATITIS B SURFACE ANTIGEN: Hepatitis B Surface Ag: NONREACTIVE

## 2024-03-30 LAB — CBG MONITORING, ED: Glucose-Capillary: 88 mg/dL (ref 70–99)

## 2024-03-30 LAB — GLUCOSE, CAPILLARY
Glucose-Capillary: 108 mg/dL — ABNORMAL HIGH (ref 70–99)
Glucose-Capillary: 174 mg/dL — ABNORMAL HIGH (ref 70–99)

## 2024-03-30 LAB — HEMOGLOBIN A1C
Hgb A1c MFr Bld: 5.7 % — ABNORMAL HIGH (ref 4.8–5.6)
Mean Plasma Glucose: 116.89 mg/dL

## 2024-03-30 MED ORDER — HEPARIN SODIUM (PORCINE) 1000 UNIT/ML IJ SOLN
INTRAMUSCULAR | Status: AC
Start: 2024-03-30 — End: 2024-03-30
  Filled 2024-03-30: qty 5

## 2024-03-30 MED ORDER — FERROUS SULFATE 325 (65 FE) MG PO TABS
325.0000 mg | ORAL_TABLET | Freq: Two times a day (BID) | ORAL | Status: DC
  Administered 2024-03-30 – 2024-04-03 (×7): 325 mg via ORAL
  Filled 2024-03-30 (×7): qty 1

## 2024-03-30 MED ORDER — ROSUVASTATIN CALCIUM 10 MG PO TABS
40.0000 mg | ORAL_TABLET | Freq: Every day | ORAL | Status: DC
Start: 2024-03-30 — End: 2024-04-03
  Administered 2024-03-30 – 2024-04-03 (×4): 40 mg via ORAL
  Filled 2024-03-30 (×2): qty 4
  Filled 2024-03-30: qty 2
  Filled 2024-03-30: qty 4

## 2024-03-30 MED ORDER — CLOPIDOGREL BISULFATE 75 MG PO TABS
75.0000 mg | ORAL_TABLET | Freq: Every day | ORAL | Status: DC
  Administered 2024-03-30 – 2024-04-01 (×3): 75 mg via ORAL
  Filled 2024-03-30 (×3): qty 1

## 2024-03-30 MED ORDER — LEVOTHYROXINE SODIUM 100 MCG PO TABS
100.0000 ug | ORAL_TABLET | Freq: Every day | ORAL | Status: DC
  Administered 2024-03-30 – 2024-04-03 (×5): 100 ug via ORAL
  Filled 2024-03-30 (×4): qty 1
  Filled 2024-03-30: qty 2

## 2024-03-30 MED ORDER — POTASSIUM CHLORIDE CRYS ER 20 MEQ PO TBCR
20.0000 meq | EXTENDED_RELEASE_TABLET | Freq: Every day | ORAL | Status: DC
  Administered 2024-03-30 – 2024-04-01 (×3): 20 meq via ORAL
  Filled 2024-03-30 (×3): qty 1

## 2024-03-30 MED ORDER — ENSURE PLUS HIGH PROTEIN PO LIQD
237.0000 mL | Freq: Two times a day (BID) | ORAL | Status: DC
  Administered 2024-03-31 – 2024-04-01 (×4): 237 mL via ORAL

## 2024-03-30 MED ORDER — ASPIRIN 81 MG PO TBEC
81.0000 mg | DELAYED_RELEASE_TABLET | Freq: Every day | ORAL | Status: DC
Start: 2024-03-30 — End: 2024-04-01
  Administered 2024-03-30 – 2024-04-01 (×3): 81 mg via ORAL
  Filled 2024-03-30 (×3): qty 1

## 2024-03-30 MED ORDER — CHLORHEXIDINE GLUCONATE CLOTH 2 % EX PADS
6.0000 | MEDICATED_PAD | Freq: Every day | CUTANEOUS | Status: DC
  Administered 2024-03-31 – 2024-04-03 (×4): 6 via TOPICAL
  Filled 2024-03-30: qty 6

## 2024-03-30 NOTE — Plan of Care (Signed)

## 2024-03-30 NOTE — ED Notes (Signed)
Pt transported to Dialysis at this time.  

## 2024-03-30 NOTE — Progress Notes (Signed)
 [facility]  ROUNDING NOTE   Subjective:   Matthew [name]  is a 89 y.o. male with end stage renal disease on hemodialysis, hypotension, hyperlipidemia, coronary artery disease status post CABG, atrial fibrillation, ascending aortic aneurysm, congestive hear failure, and diabetes mellitus type II.  Patient presented to the emergency department complaining of general weakness and has been admitted under observation for CHF exacerbation (HCC) [I50.9] Acute on chronic congestive heart failure, unspecified heart failure type [facility]) [I50.9]  Patient is known to our practice and receives outpatient dialysis treatments at [facility] on a MWF schedule, supervised by Dr. Marcelino.  Last treatment received on Wednesday.  Patient states he was supposed to fill out a medication for amiodarone  at the pharmacy however pharmacy had questions about this medication and have not filled it yet.  Patient states he has been having increased weakness.  Denies known palpitations or racing heart.  Mild shortness of breath, reports nothing out of the ordinary.  Labs on ED arrival unremarkable for renal patient.  BNP 746 with troponin 52.  Chest x-ray shows trace bilateral pleural effusions with questionable pneumonia.  CT chest shows ascending thoracic aortic aneurysm, recommended monitoring semiannually.  We have been consulted to manage dialysis needs during this admission.  Objective:  Vital signs in last 24 hours:  Temp:  [97.5 F (36.4 C)-98.1 F (36.7 C)] 97.8 F (36.6 C) (11/07 1200) Pulse Rate:  [65-121] 109 (11/07 1300) Resp:  [16-26] 18 (11/07 1300) BP: (91-132)/(51-99) 102/77 (11/07 1300) SpO2:  [89 %-100 %] 100 % (11/07 1300) Weight:  [83.6 kg] 83.6 kg (11/07 1200)  Weight change:  Filed Weights   03/30/24 1200  Weight: 83.6 kg    Intake/Output: I/O last 3 completed shifts: In: -  Out: 600 [Urine:600]   Intake/Output this shift:  No intake/output data recorded.  Physical  Exam: General: NAD  Head: Normocephalic, atraumatic. Moist oral mucosal membranes  Eyes: Anicteric  Lungs:  Diminished in bases  Heart: Regular rate and rhythm  Abdomen:  Soft, nontender  Extremities: No peripheral edema.  Neurologic: Awake, alert, conversant  Skin: Warm,dry, no rash  Access: Right IJ PermCath    Basic Metabolic Panel: Recent Labs  Lab 03/29/24 1552  NA 133*  K 4.2  CL 95*  CO2 25  GLUCOSE 121*  BUN 44*  CREATININE 2.36*  CALCIUM  9.1    Liver Function Tests: No results for input(s): AST, ALT, ALKPHOS, BILITOT, PROT, ALBUMIN  in the last 168 hours. No results for input(s): LIPASE, AMYLASE in the last 168 hours. No results for input(s): AMMONIA in the last 168 hours.  CBC: Recent Labs  Lab 03/29/24 1552  WBC 5.9  HGB 12.5*  HCT 38.6*  MCV 97.7  PLT 85*    Cardiac Enzymes: No results for input(s): CKTOTAL, CKMB, CKMBINDEX, TROPONINI in the last 168 hours.  BNP: Invalid input(s): POCBNP  CBG: Recent Labs  Lab 03/29/24 2347 03/30/24 0726  GLUCAP 117* 88    Microbiology: Results for orders placed or performed during the hospital encounter of 01/21/23  MRSA Next Gen by PCR, Nasal     Status: None   Collection Time: 01/22/23  6:33 AM   Specimen: Nasal Mucosa; Nasal Swab  Result Value Ref Range Status   MRSA by PCR Next Gen NOT DETECTED NOT DETECTED Final    Comment: (NOTE) The GeneXpert MRSA Assay (FDA approved for NASAL specimens only), is one component of a comprehensive MRSA colonization surveillance program. It is not intended to diagnose  MRSA infection nor to guide or monitor treatment for MRSA infections. Test performance is not FDA approved in patients less than 51 years old. Performed at [facility], [address] [place], [place] [address]     Coagulation Studies: No results for input(s): LABPROT, INR in the last 72 hours.  Urinalysis: No results for input(s): COLORURINE,  LABSPEC, PHURINE, GLUCOSEU, HGBUR, BILIRUBINUR, KETONESUR, PROTEINUR, UROBILINOGEN, NITRITE, LEUKOCYTESUR in the last 72 hours.  Invalid input(s): APPERANCEUR    Imaging: CT CHEST WO CONTRAST Result Date: 03/29/2024 CLINICAL DATA:  Respiratory illness, nondiagnostic x-ray, atrial fibrillation, generalized weakness EXAM: CT CHEST WITHOUT CONTRAST TECHNIQUE: Multidetector CT imaging of the chest was performed following the standard protocol without IV contrast. RADIATION DOSE REDUCTION: This exam was performed according to the departmental dose-optimization program which includes automated exposure control, adjustment of the mA and/or kV according to patient size and/or use of iterative reconstruction technique. COMPARISON:  03/29/2024 FINDINGS: Cardiovascular: Unenhanced imaging of the heart demonstrates cardiomegaly without pericardial effusion. There is a 6.8 cm ascending thoracic aortic aneurysm. Assessment of the aortic lumen cannot be performed without intravenous contrast. Extensive atherosclerosis throughout the aorta and native coronary vasculature. Postsurgical changes from previous CABG. Right internal jugular catheter tip within the superior vena cava. Mediastinum/Nodes: No enlarged mediastinal or axillary lymph nodes. Thyroid  gland, trachea, and esophagus demonstrate no significant findings. Lungs/Pleura: Small bilateral pleural effusions and dependent atelectasis. No airspace disease or pneumothorax. The central airways are patent. Upper Abdomen: No acute abnormality. Musculoskeletal: No acute or destructive bony abnormalities. Diffuse thoracic spondylosis. Reconstructed images demonstrate no additional findings. IMPRESSION: 1. 6.8 cm ascending thoracic aortic aneurysm. Assessment of the aortic lumen cannot be performed without intravenous contrast. Recommend semi-annual imaging followup by CTA or MRA and referral to cardiothoracic surgery if not already obtained. This  recommendation follows 2010 ACCF/AHA/AATS/ACR/ASA/SCA/SCAI/SIR/STS/SVM Guidelines for the Diagnosis and Management of Patients With Thoracic Aortic Disease. Circulation. 2010; 121: [id number]. Aortic aneurysm NOS (ICD10-I71.9) 2. Cardiomegaly without pericardial effusion. 3. Small bilateral pleural effusions with dependent lower lobe atelectasis. 4. Aortic Atherosclerosis (ICD10-I70.0). Coronary artery atherosclerosis. Electronically Signed   By: Ozell Daring M.D.   On: 03/29/2024 20:18   DG Chest 2 View Result Date: 03/29/2024 CLINICAL DATA:  Shortness of breath EXAM: CHEST - 2 VIEW COMPARISON:  Chest radiograph dated 01/10/2023. FINDINGS: Right-sided dialysis catheter with tip over central SVC. Trace bilateral pleural effusions and left lung base atelectasis/scarring. Pneumonia is not excluded. No pneumothorax. Stable cardiac silhouette. Median sternotomy wires. Left axillary vascular stent. No acute osseous pathology. IMPRESSION: Trace bilateral pleural effusions and left lung base atelectasis/scarring. Pneumonia is not excluded. Electronically Signed   By: Vanetta Chou M.D.   On: 03/29/2024 16:29     Medications:     aspirin  EC  81 mg Oral Daily   Chlorhexidine  Gluconate Cloth  6 each Topical Q0600   clopidogrel   75 mg Oral Daily   ferrous sulfate   325 mg Oral BID WC   furosemide   40 mg Intravenous BID   heparin   5,000 Units Subcutaneous Q8H   insulin  aspart  0-5 Units Subcutaneous QHS   insulin  aspart  0-6 Units Subcutaneous TID WC   levothyroxine   100 mcg Oral Q0600   potassium chloride  SA  20 mEq Oral Daily   rosuvastatin   40 mg Oral Daily   acetaminophen  **OR** acetaminophen , HYDROcodone -acetaminophen , ondansetron  **OR** ondansetron  (ZOFRAN ) IV  Assessment/ Plan:  Matthew [name] is a 89 y.o.  male with end stage renal disease on hemodialysis, hypotension,  hyperlipidemia, coronary artery disease status post CABG, atrial fibrillation, ascending aortic aneurysm, congestive  hear failure, and diabetes mellitus type II.Patient presents with weakness and his admitted under observation for CHF exacerbation (HCC) [I50.9] Acute on chronic congestive heart failure, unspecified heart failure type (HCC) [I50.9]    [facility] [address] MWF L AVF   Acute on chronic heart failure  with bilateral pleural effusions seen on chest xray and CT chest. Mild shortness of breath. BNP 746. Last ECHO on 01/11/23 showed EF 35-40%, updated ECHO pending. IV furosemide  40mg  ordered. Awaiting cardiology consult.   2.  End-stage renal disease on hemodialysis.  Last treatment received on Wednesday.  Patient scheduled to receive dialysis today, UF goal 1.5 L as tolerated.  Will likely require additional treatment tomorrow for low volume fluid removal.  3.  Atrial fibrillation, chronic.  Mild heart rate elevation.  Patient prescribed amiodarone  outpatient however has not started medication.  Awaiting cardiology consult.  4. Anemia of chronic kidney disease Lab Results  Component Value Date   HGB 12.5 (L) 03/29/2024    Hemoglobin optimal for renal patient.  No need for ESA's at this time    LOS: 0 Aidah Forquer 11/7/20251:18 PM

## 2024-03-30 NOTE — Progress Notes (Signed)
   03/30/24 1542  Vitals  Temp (!) 97.4 F (36.3 C)  Pulse Rate (!) 108  Resp 18  Weight 82.3 kg  Type of Weight Post-Dialysis  Oxygen Therapy  SpO2 100 %  O2 Device Nasal Cannula  Patient Activity (if Appropriate) In bed  Pulse Oximetry Type Continuous  Oximetry Probe Site Changed No  Post Treatment  Dialyzer Clearance Clear  Hemodialysis Intake (mL) 0 mL  Liters Processed 84  Fluid Removed (mL) 1500 mL  Tolerated HD Treatment Yes  Post-Hemodialysis Comments Tx has been tolerated. Goal has been met. VS have remained stable. Access dressing has been changed per policy. No drainage present. Patient is stable to return to the ED.

## 2024-03-30 NOTE — Consult Note (Signed)
 CARDIOLOGY CONSULT NOTE               Patient ID: Matthew [name] MRN: [id number] DOB/AGE: 1930/10/13 89 y.o.  Admit date: 03/29/2024 Referring Physician Dr. Delayne Solian hospitalist Primary Physician Dr. Colan primary Primary Cardiologist:[facility] cardiologist Reason for Consultation weakness atrial fibrillation  HPI: Patient is a 89 year old male history of multiple medical problems multivessel coronary disease coronary bypass surgery previous stents end-stage renal disease on hemodialysis Monday Wednesday Friday found to have atrial fibrillation on anticoagulation was on amiodarone  now discontinued he has been restarted on amiodarone  but has not filled the prescription yet has ascending aortic aneurysm greater than 6 cm she has had HFrEF ejection fraction 30 to 35% hypertension lipidemia diabetes admitted with heart failure exacerbation weakness and fatigue shortness of breath.  Patient denies any chest pain leg edema lightheaded dizziness no palpitations or tachycardia recently saw his cardiologist and was prescribed amiodarone  but he has not been able to have it filled yet because of something with the mail-order pharmacy.  He has had flat troponins BNP was slightly elevated cardiology was consulted to help with heart failure management and A-fib management  Review of systems complete and found to be negative unless listed above     Past Medical History:  Diagnosis Date   Anginal pain    Arthritis    Ascending aortic aneurysm 07/18/2015   a.) TTE 07/18/2015: severe dilation of ascending aorta measuring 7.0 cm. b.) TTE 01/10/2018: aortic root 3.8 cm; ascending aorta 6.8 cm; refused surgical intervention/repair.   Atrial fibrillation (HCC)    a.) CHA2DS2-VASc = 6 (age x 2, HFrEF, HTN, previous MI, T2DM). b.) rate/rhythm maintained on amiodarone  + metoprolol  succinate; chronic antiplatelet therapy using clopidogrel    Bradycardia    Coronary artery disease    ESRD (end stage renal  disease) (HCC)    GERD (gastroesophageal reflux disease)    HFrEF (heart failure with reduced ejection fraction) (HCC)    a.) TTE 12/03/2013: mod dec LV function; EF 35-40%; severe inferolateral and inferior HK; LA dilated; G3DD; PASP 52 mmHg. b.) TTE 07/18/2015: mild LV dysfunction with mild LVH; EF 45%; mild BAE. 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 c.) TTE 01/10/2018: mod AR/TR    Past Surgical History:  Procedure Laterality Date   A/V FISTULAGRAM Left 02/09/2023   Procedure: A/V Fistulagram;  Surgeon: Marea Selinda RAMAN, MD;  Location: [facility];  Service: Cardiovascular;  Laterality: Left;   A/V SHUNT INTERVENTION N/A 01/25/2023   Procedure: A/V SHUNT INTERVENTION;  Surgeon: Jama Cordella MATSU, MD;  Location: [facility];  Service: Cardiovascular;  Laterality: N/A;   A/V SHUNTOGRAM Left 04/01/2022   Procedure: A/V SHUNTOGRAM;  Surgeon: Marea Selinda RAMAN, MD;  Location: [facility];  Service: Cardiovascular;  Laterality: Left;   AV FISTULA PLACEMENT Left 04/15/2021   Procedure: INSERTION OF ARTERIOVENOUS (AV) GORE-TEX GRAFT ARM ( BRACHIAL AXILLARY);  Surgeon: Marea Selinda RAMAN, MD;  Location: [facility];  Service: Vascular;  Laterality: Left;   CATARACT EXTRACTION W/PHACO Left 07/26/2017   Procedure: CATARACT EXTRACTION PHACO AND INTRAOCULAR LENS PLACEMENT  (IOC);  Surgeon: Jaye Fallow, MD;  Location: [facility];  Service: Ophthalmology;  Laterality: Left;  US    00:45.8 AP%  13.1 CDE  6.00 Fluid Pack Lot # [id number]   CATARACT EXTRACTION W/PHACO Right 08/17/2017   Procedure: CATARACT EXTRACTION PHACO AND INTRAOCULAR LENS PLACEMENT (IOC);  Surgeon: Jaye Fallow, MD;  Location: [facility];  Service: Ophthalmology;  Laterality: Right;  US   01:30 AP% 16.8 CDE 15.24 Fluid pack lot # [id number]   CHOLECYSTECTOMY     CORONARY ARTERY BYPASS GRAFT N/A 2001   Procedure: 5v CABG (LIMA-LAD, SVG-D1, SVG-OM1, SVG-OM2, SVG-PDA)   DIALYSIS/PERMA CATHETER INSERTION N/A 02/09/2018   Procedure: DIALYSIS/PERMA CATHETER INSERTION;  Surgeon: Marea Selinda RAMAN, MD;  Location: [facility];  Service: Cardiovascular;  Laterality: N/A;   DIALYSIS/PERMA CATHETER INSERTION N/A 02/09/2023   Procedure: DIALYSIS/PERMA CATHETER INSERTION;  Surgeon: Marea Selinda RAMAN, MD;  Location: [facility];  Service: Cardiovascular;  Laterality: N/A;   DIALYSIS/PERMA CATHETER INSERTION N/A 11/28/2023   Procedure: DIALYSIS/PERMA CATHETER INSERTION;  Surgeon: Marea Selinda RAMAN, MD;  Location: [facility];  Service: Cardiovascular;  Laterality: N/A;   DIALYSIS/PERMA CATHETER REMOVAL N/A 06/25/2021   Procedure: DIALYSIS/PERMA CATHETER REMOVAL;  Surgeon: Marea Selinda RAMAN, MD;  Location: [facility];  Service: Cardiovascular;  Laterality: N/A;   FRACTURE SURGERY Left 2015   LEFT arm   INSERTION OF DIALYSIS CATHETER Right 11/03/2020   Procedure: INSERTION OF Perm Cath in the RIGHT INTERNAL JUGULAR;  Surgeon: Marea Selinda RAMAN, MD;  Location: [facility];  Service: Vascular;  Laterality: Right;   INTRAMEDULLARY (IM) NAIL INTERTROCHANTERIC Right 11/12/2022   Procedure: INTRAMEDULLARY (IM) NAIL INTERTROCHANTERIC;  Surgeon: Marchia Drivers, MD;  Location: [facility];  Service: Orthopedics;  Laterality: Right;   LEFT HEART CATHETERIZATION WITH CORONARY/GRAFT ANGIOGRAM Left 12/04/2013    Procedure: LEFT HEART CATHETERIZATION WITH EL BILE;  Surgeon: Alm LELON Clay, MD;  Location: [facility] CATH LAB;  Service: Cardiovascular;  Laterality: Left;   LEFT HEART CATHETERIZATION WITH CORONARY/GRAFT ANGIOGRAM Left 02/06/2009   Procedure: LEFT HEART CATHETERIZATION WITH CORONARY/GRAFT ANGIOGRAM; Location: [facility]; Surgeon: Vita Bathe, MD   PERCUTANEOUS CORONARY STENT INTERVENTION (PCI-S) N/A 12/06/2013   Procedure: STAGED PERCUTANEOUS CORONARY STENT INTERVENTION (overlapping 4.0 x 38 mm (mid) and 4.0 x 28 mm (ostial) Promus Primier DES to SVG-RPDA graft);  Surgeon: Victory LELON Claudene DOUGLAS, MD;  Location: [facility] CATH LAB;  Service: Cardiovascular   PERIPHERAL VASCULAR THROMBECTOMY Left 08/31/2023   Procedure: PERIPHERAL VASCULAR THROMBECTOMY;  Surgeon: Jama Cordella MATSU, MD;  Location: [facility];  Service: Cardiovascular;  Laterality: Left;   REMOVAL OF A DIALYSIS CATHETER N/A 11/03/2020   Procedure: REMOVAL OF A DIALYSIS CATHETER;  Surgeon: Marea Selinda RAMAN, MD;  Location: [facility];  Service: Vascular;  Laterality: N/A;   TEMPORARY DIALYSIS CATHETER N/A 02/04/2023   Procedure: TEMPORARY DIALYSIS CATHETER;  Surgeon: Jama Cordella MATSU, MD;  Location: [facility];  Service: Cardiovascular;  Laterality: N/A;   TOTAL HIP REVISION Right 01/12/2023   Procedure: CONVERSION TO  RIGHT TOTAL HIP REPLACEMENT;  Surgeon: Edna Toribio LABOR, MD;  Location: [facility];  Service: Orthopedics;  Laterality: Right;    (  Not in a hospital admission)  Social History   Socioeconomic History   Marital status: Widowed    Spouse name: Not on file   Number of children: Not on file   Years of education: Not on file   Highest education level: Not on file  Occupational History   Not on file  Tobacco Use   Smoking status: Former    Current packs/day: 0.00    Types: Pipe, Cigarettes    Quit date: 15    Years since quitting: 33.8   Smokeless tobacco: Never  Vaping Use   Vaping status: Never Used   Substance and Sexual Activity   Alcohol use: Not Currently    Alcohol/week: 1.0 standard drink of alcohol    Types: 1 Cans of beer per week    Comment: occasionally drinking only,once a month   Drug use: No   Sexual activity: Never  Other Topics Concern   Not on file  Social History Narrative   Lives with daughter and son in law   Social Drivers of Health   Financial Resource Strain: Low Risk  (01/04/2024)   Received from [facility]   Overall Financial Resource Strain (CARDIA)    Difficulty of Paying Living Expenses: Not hard at all  Food Insecurity: No Food Insecurity (01/04/2024)   Received from [facility]   Hunger Vital Sign    Within the past 12 months, you worried that your food would run out before you got the money to buy more.: Never true    Within the past 12 months, the food you bought just didn't last and you didn't have money to get more.: Never true  Transportation Needs: No Transportation Needs (01/04/2024)   Received from [facility] - Transportation    In the past 12 months, has lack of transportation kept you from medical appointments or from getting medications?: No    Lack of Transportation (Non-Medical): No  Physical Activity: Inactive (01/10/2018)   Exercise Vital Sign    Days of Exercise per Week: 0 days    Minutes of Exercise per Session: 0 min  Stress: No Stress Concern Present (01/10/2018)   [employer] of [employer]ational [employer] - Occupational Stress Questionnaire    Feeling of Stress : Not at all  Social Connections: Socially Integrated (01/10/2018)   Social Connection and Isolation Panel    Frequency of Communication with Friends and Family: More than three times a week    Frequency of Social Gatherings with Friends and Family: More than three times a week    Attends Religious Services: More than 4 times per year    Active Member of [employer] or Organizations: Yes    Attends Museum/gallery Exhibitions Officer: More than 4 times per year    Marital Status: Married  Catering Manager Violence: Not At Risk (02/04/2023)   Humiliation, Afraid, Rape, and Kick questionnaire    Fear of Current or Ex-Partner: No    Emotionally Abused: No    Physically Abused: No    Sexually Abused: No    Family History  Problem Relation Age of Onset   Heart attack Father 38       died first MI at age 67   Heart failure Mother       Review of systems complete and found to be negative unless listed above      PHYSICAL EXAM  General: Well developed, well nourished, in no acute distress HEENT:  Normocephalic and atramatic Neck:  No JVD.  Lungs: Clear bilaterally to auscultation and percussion. Heart: HRRR . Normal S1 and S2 without gallops or murmurs.  Abdomen: Bowel sounds are positive, abdomen soft and non-tender  Msk:  Back normal, normal gait. Normal strength and tone for age. Extremities: No clubbing, cyanosis or edema.   Neuro: Alert and oriented X 3. Psych:  Good affect, responds appropriately  Labs:   Lab Results  Component Value Date   WBC 5.9 03/29/2024   HGB 12.5 (L) 03/29/2024   HCT 38.6 (L) 03/29/2024   MCV 97.7 03/29/2024   PLT 85 (L) 03/29/2024    Recent Labs  Lab 03/29/24 1552  NA 133*  K 4.2  CL 95*  CO2 25  BUN 44*  CREATININE 2.36*  CALCIUM  9.1  GLUCOSE 121*   Lab Results  Component Value Date   CKTOTAL 20 (L) 01/09/2024   TROPONINI <0.03 02/15/2018    Lab Results  Component Value Date   CHOL 122 09/29/2023   CHOL 131 06/30/2023   CHOL 158 04/26/2023   Lab Results  Component Value Date   HDL 65 09/29/2023   HDL 64 06/30/2023   HDL 69 04/26/2023   Lab Results  Component Value Date   LDLCALC 45 09/29/2023   LDLCALC 51 06/30/2023   LDLCALC 71 04/26/2023   Lab Results  Component Value Date   TRIG 53 09/29/2023   TRIG 84 06/30/2023   TRIG 102 04/26/2023   Lab Results  Component Value Date   CHOLHDL 1.9 09/29/2023   CHOLHDL 2.0  06/30/2023   CHOLHDL 2.3 04/26/2023   No results found for: LDLDIRECT    Radiology: CT CHEST WO CONTRAST Result Date: 03/29/2024 CLINICAL DATA:  Respiratory illness, nondiagnostic x-ray, atrial fibrillation, generalized weakness EXAM: CT CHEST WITHOUT CONTRAST TECHNIQUE: Multidetector CT imaging of the chest was performed following the standard protocol without IV contrast. RADIATION DOSE REDUCTION: This exam was performed according to the departmental dose-optimization program which includes automated exposure control, adjustment of the mA and/or kV according to patient size and/or use of iterative reconstruction technique. COMPARISON:  03/29/2024 FINDINGS: Cardiovascular: Unenhanced imaging of the heart demonstrates cardiomegaly without pericardial effusion. There is a 6.8 cm ascending thoracic aortic aneurysm. Assessment of the aortic lumen cannot be performed without intravenous contrast. Extensive atherosclerosis throughout the aorta and native coronary vasculature. Postsurgical changes from previous CABG. Right internal jugular catheter tip within the superior vena cava. Mediastinum/Nodes: No enlarged mediastinal or axillary lymph nodes. Thyroid  gland, trachea, and esophagus demonstrate no significant findings. Lungs/Pleura: Small bilateral pleural effusions and dependent atelectasis. No airspace disease or pneumothorax. The central airways are patent. Upper Abdomen: No acute abnormality. Musculoskeletal: No acute or destructive bony abnormalities. Diffuse thoracic spondylosis. Reconstructed images demonstrate no additional findings. IMPRESSION: 1. 6.8 cm ascending thoracic aortic aneurysm. Assessment of the aortic lumen cannot be performed without intravenous contrast. Recommend semi-annual imaging followup by CTA or MRA and referral to cardiothoracic surgery if not already obtained. This recommendation follows 2010 ACCF/AHA/AATS/ACR/ASA/SCA/SCAI/SIR/STS/SVM Guidelines for the Diagnosis and Management  of Patients With Thoracic Aortic Disease. Circulation. 2010; 121: [id number]. Aortic aneurysm NOS (ICD10-I71.9) 2. Cardiomegaly without pericardial effusion. 3. Small bilateral pleural effusions with dependent lower lobe atelectasis. 4. Aortic Atherosclerosis (ICD10-I70.0). Coronary artery atherosclerosis. Electronically Signed   By: Ozell Daring M.D.   On: 03/29/2024 20:18   DG Chest 2 View Result Date: 03/29/2024 CLINICAL DATA:  Shortness of breath EXAM: CHEST - 2 VIEW COMPARISON:  Chest radiograph dated 01/10/2023. FINDINGS:  Right-sided dialysis catheter with tip over central SVC. Trace bilateral pleural effusions and left lung base atelectasis/scarring. Pneumonia is not excluded. No pneumothorax. Stable cardiac silhouette. Median sternotomy wires. Left axillary vascular stent. No acute osseous pathology. IMPRESSION: Trace bilateral pleural effusions and left lung base atelectasis/scarring. Pneumonia is not excluded. Electronically Signed   By: Vanetta Chou M.D.   On: 03/29/2024 16:29    EKG: Atrial fibrillation rate of 90 nonspecific ST-T wave changes possible anterior infarct  ASSESSMENT AND PLAN:  Acute on chronic HFpEF Cardiomyopathy Chronic atrial fibrillation with RVR Coronary artery disease History of coronary bypass surgery End-stage renal disease on dialysis Diabetes Thoracic aneurysm Generalized weakness . Plan Agree with admit continue current therapy for acute on chronic heart failure Lasix  therapy GDMT Echocardiogram for assessment of left ventricular function wall motion Multivessel coronary disease previous coronary bypass surgery denies any anginal flat troponins End-stage renal disease on dialysis continue current therapy follow-up as per nephrology Diabetes type 2 continue diabetes management A1c goal less than 7 Thoracic aneurysm 6.8 have the patient follow-up with vascular CT surgery Recommend conservative medical therapy for heart failure generalized weakness  recommend physical therapy to help with strength training and balance Recommend amiodarone  load IV consider rhythm control chemically or cardioversion if not able to convert Anticoagulation for atrial fibrillation recommend Eliquis Signed: Cara JONETTA Lovelace MD, 03/30/2024, 7:16 AM

## 2024-03-30 NOTE — Progress Notes (Signed)
 Pt receives outpt HD at [facility] MWF. Navigator following to assist with any HD needs.  Suzen Satchel Dialysis Navigator [phone]

## 2024-03-30 NOTE — ED Notes (Signed)
 Pt's oxygen dropped to 88% while sleeping, placed on 2L of oxygen.

## 2024-03-30 NOTE — Progress Notes (Signed)
 PROGRESS NOTE    [name]  FMW:[id number] DOB: Feb 03, 1931 DOA: 03/29/2024 PCP: Albina GORMAN Dine, MD  Outpatient Specialists: cardiology    Brief Narrative:   From admission h and p  Matthew [name] is a 89 y.o. male with medical history significant for CAD s/p CABG and stents, ESRD on HD MWF, atrial fibrillation(not on [facility], previously on amiodarone ), ascending aortic aneurysm, HFrEF(EF 30-35% 12/2022), HTN, HLD,DM, being admitted for CHF exacerbation after presenting with a history of  generalized weakness, shortness of breath.  He denies chest pain or leg swelling.  He recently saw his cardiologist, Dr. Fernand who ordered an echocardiogram (scheduled for 04/03/24) and  placed him back on amiodarone  but it has not yet been filled (note not found in Care Everywhere) In the ED, afebrile, heart rate 99-108.  O2 sats 98% on room air and normotensive.  Assessment & Plan:   Principal Problem:   Acute on chronic HFrEF (heart failure with reduced ejection fraction) (HCC) Active Problems:   Chronic atrial fibrillation with RVR (HCC)   CAD with history of CABG and stents (coronary artery disease)   Chronic hypotension   ESRD on dialysis (HCC)   Diabetes mellitus, type II (HCC)   Aneurysm of thoracic aorta  Acute on chronic HFrEF (heart failure with reduced ejection fraction) (HCC) Bilateral pleural effusion Patient with shortness of breath appears mildly fluid overloaded, has elevated BNP of 746 Dialysis today, feeling improved Will continue Lasix  40 mg twice daily as patient still produces urine Daily weights with intake and output monitoring Getting updated echocardiogram Cardiology has been consulted-recs pending   Chronic atrial fibrillation with RVR (HCC) Mildly increased ventricular rate, 99-110 Continue aspirin  .Not currently on anticoagulation Plan by cardiology to restart amiodarone , not yet filled Will hold off on starting amiodarone  pending cardiology consult   CAD  with history of CABG and stents (coronary artery disease) Elevated troponin (flat trend 52-> 52) No complaints of chest pain, EKG nonacute and troponins slightly elevated but flat 52-52 likely demand ischemia  Continue aspirin  and rosuvastatin  as well as nitroglycerin  sublingual as needed chest pain Echocardiogram pending   ESRD on dialysis [facility]) Nephrology consulted for continuation of dialysis, tolerated today, uf 1.5 liters   Diabetes mellitus, type II (HCC) Sliding scale insulin  coverage   Aneurysm of thoracic aorta 6.8 cm.  Recommendation for semiannual imaging   DVT prophylaxis: heparin  Code Status: dnr Family Communication: none at bedside  Level of care: Progressive Status is: Observation     Consultants:  nephrology  Procedures: hemodialysis  Antimicrobials:  none    Subjective: Reports breathing improved after dialysis  Objective: Vitals:   03/30/24 1530 03/30/24 1539 03/30/24 1541 03/30/24 1542  BP: 99/64  104/75   Pulse: (!) 123 (!) 104 (!) 108 (!) 108  Resp: 12 (!) 23 19 18   Temp:    (!) 97.4 F (36.3 C)  TempSrc:      SpO2: 100%  99% 100%  Weight:    82.3 kg    Intake/Output Summary (Last 24 hours) at 03/30/2024 1715 Last data filed at 03/30/2024 1542 Gross per 24 hour  Intake --  Output 2100 ml  Net -2100 ml   Filed Weights   03/30/24 1200 03/30/24 1542  Weight: 83.6 kg 82.3 kg    Examination:  General exam: Appears calm and comfortable  Respiratory system: Clear to auscultation save for rales at bases Cardiovascular system: S1 & S2 heard, mild tachy, soft systolic murmur Gastrointestinal system: Abdomen is nondistended,  soft and nontender. No organomegaly or masses felt.   Central nervous system: Alert and oriented. No focal neurological deficits. Extremities: Symmetric 5 x 5 power. Skin: No rashes, lesions or ulcers Psychiatry: Judgement and insight appear normal. Mood & affect appropriate.     Data Reviewed: I have  personally reviewed following labs and imaging studies  CBC: Recent Labs  Lab 03/29/24 1552  WBC 5.9  HGB 12.5*  HCT 38.6*  MCV 97.7  PLT 85*   Basic Metabolic Panel: Recent Labs  Lab 03/29/24 1552  NA 133*  K 4.2  CL 95*  CO2 25  GLUCOSE 121*  BUN 44*  CREATININE 2.36*  CALCIUM  9.1   GFR: Estimated Creatinine Clearance: 21.1 mL/min (A) (by C-G formula based on SCr of 2.36 mg/dL (H)). Liver Function Tests: No results for input(s): AST, ALT, ALKPHOS, BILITOT, PROT, ALBUMIN  in the last 168 hours. No results for input(s): LIPASE, AMYLASE in the last 168 hours. No results for input(s): AMMONIA in the last 168 hours. Coagulation Profile: No results for input(s): INR, PROTIME in the last 168 hours. Cardiac Enzymes: No results for input(s): CKTOTAL, CKMB, CKMBINDEX, TROPONINI in the last 168 hours. BNP (last 3 results) No results for input(s): PROBNP in the last 8760 hours. HbA1C: Recent Labs    03/30/24 0434  HGBA1C 5.7*   CBG: Recent Labs  Lab 03/29/24 2347 03/30/24 0726 03/30/24 1642  GLUCAP 117* 88 108*   Lipid Profile: No results for input(s): CHOL, HDL, LDLCALC, TRIG, CHOLHDL, LDLDIRECT in the last 72 hours. Thyroid  Function Tests: No results for input(s): TSH, T4TOTAL, FREET4, T3FREE, THYROIDAB in the last 72 hours. Anemia Panel: No results for input(s): VITAMINB12, FOLATE, FERRITIN, TIBC, IRON, RETICCTPCT in the last 72 hours. Urine analysis:    Component Value Date/Time   COLORURINE YELLOW (A) 05/24/2019 0021   APPEARANCEUR CLEAR (A) 05/24/2019 0021   LABSPEC 1.012 05/24/2019 0021   PHURINE 6.0 05/24/2019 0021   GLUCOSEU NEGATIVE 05/24/2019 0021   HGBUR NEGATIVE 05/24/2019 0021   BILIRUBINUR Negative 10/15/2022 1202   KETONESUR NEGATIVE 05/24/2019 0021   PROTEINUR Positive (A) 10/15/2022 1202   PROTEINUR 30 (A) 05/24/2019 0021   UROBILINOGEN 0.2 10/15/2022 1202   NITRITE Negative  10/15/2022 1202   NITRITE NEGATIVE 05/24/2019 0021   LEUKOCYTESUR Large (3+) (A) 10/15/2022 1202   LEUKOCYTESUR NEGATIVE 05/24/2019 0021   Sepsis Labs: @LABRCNTIP (procalcitonin:4,lacticidven:4)  )No results found for this or any previous visit (from the past 240 hours).       Radiology Studies: CT CHEST WO CONTRAST Result Date: 03/29/2024 CLINICAL DATA:  Respiratory illness, nondiagnostic x-ray, atrial fibrillation, generalized weakness EXAM: CT CHEST WITHOUT CONTRAST TECHNIQUE: Multidetector CT imaging of the chest was performed following the standard protocol without IV contrast. RADIATION DOSE REDUCTION: This exam was performed according to the departmental dose-optimization program which includes automated exposure control, adjustment of the mA and/or kV according to patient size and/or use of iterative reconstruction technique. COMPARISON:  03/29/2024 FINDINGS: Cardiovascular: Unenhanced imaging of the heart demonstrates cardiomegaly without pericardial effusion. There is a 6.8 cm ascending thoracic aortic aneurysm. Assessment of the aortic lumen cannot be performed without intravenous contrast. Extensive atherosclerosis throughout the aorta and native coronary vasculature. Postsurgical changes from previous CABG. Right internal jugular catheter tip within the superior vena cava. Mediastinum/Nodes: No enlarged mediastinal or axillary lymph nodes. Thyroid  gland, trachea, and esophagus demonstrate no significant findings. Lungs/Pleura: Small bilateral pleural effusions and dependent atelectasis. No airspace disease or pneumothorax. The central airways are patent. Upper Abdomen:  No acute abnormality. Musculoskeletal: No acute or destructive bony abnormalities. Diffuse thoracic spondylosis. Reconstructed images demonstrate no additional findings. IMPRESSION: 1. 6.8 cm ascending thoracic aortic aneurysm. Assessment of the aortic lumen cannot be performed without intravenous contrast. Recommend  semi-annual imaging followup by CTA or MRA and referral to cardiothoracic surgery if not already obtained. This recommendation follows 2010 ACCF/AHA/AATS/ACR/ASA/SCA/SCAI/SIR/STS/SVM Guidelines for the Diagnosis and Management of Patients With Thoracic Aortic Disease. Circulation. 2010; 121: E266-e369TAA. Aortic aneurysm NOS (ICD10-I71.9) 2. Cardiomegaly without pericardial effusion. 3. Small bilateral pleural effusions with dependent lower lobe atelectasis. 4. Aortic Atherosclerosis (ICD10-I70.0). Coronary artery atherosclerosis. Electronically Signed   By: Ozell Daring M.D.   On: 03/29/2024 20:18   DG Chest 2 View Result Date: 03/29/2024 CLINICAL DATA:  Shortness of breath EXAM: CHEST - 2 VIEW COMPARISON:  Chest radiograph dated 01/10/2023. FINDINGS: Right-sided dialysis catheter with tip over central SVC. Trace bilateral pleural effusions and left lung base atelectasis/scarring. Pneumonia is not excluded. No pneumothorax. Stable cardiac silhouette. Median sternotomy wires. Left axillary vascular stent. No acute osseous pathology. IMPRESSION: Trace bilateral pleural effusions and left lung base atelectasis/scarring. Pneumonia is not excluded. Electronically Signed   By: Vanetta Chou M.D.   On: 03/29/2024 16:29        Scheduled Meds:  aspirin  EC  81 mg Oral Daily   Chlorhexidine  Gluconate Cloth  6 each Topical Q0600   clopidogrel   75 mg Oral Daily   [START ON 03/31/2024] feeding supplement  237 mL Oral BID BM   ferrous sulfate   325 mg Oral BID WC   furosemide   40 mg Intravenous BID   heparin   5,000 Units Subcutaneous Q8H   insulin  aspart  0-5 Units Subcutaneous QHS   insulin  aspart  0-6 Units Subcutaneous TID WC   levothyroxine   100 mcg Oral Q0600   potassium chloride  SA  20 mEq Oral Daily   rosuvastatin   40 mg Oral Daily   Continuous Infusions:   LOS: 0 days     Devaughn KATHEE Ban, MD [facility]   If 7PM-7AM, please contact night-coverage [email or web address] Password  [email or web address] 03/30/2024, 5:15 PM

## 2024-03-30 NOTE — Consult Note (Addendum)
 WOC Nurse Consult Note: Reason for Consult: L groin abrasion  Wound type: full thickness likely r/t trauma ? Pressure and friction from adult brief/underwear  Pressure Injury POA: NA  Measurement: see nursing flowsheet  Wound bed: pink moist  Drainage (amount, consistency, odor) serosanguinous  Periwound: mild erythema  Dressing procedure/placement/frequency: Cleanse L groin wound with NS, apply a strip of silver  hydrofiber Soila [phone]) cut to fit area daily and secure with silicone foam or gauze and clothe tape whichever works best.   POC discussed with bedside nurse. WOC team will not follow. Re-consult if further needs arise.   Thank you,    Powell Bar MSN, RN-BC, [employer]

## 2024-03-31 DIAGNOSIS — I25810 Atherosclerosis of coronary artery bypass graft(s) without angina pectoris: Secondary | ICD-10-CM | POA: Diagnosis not present

## 2024-03-31 DIAGNOSIS — I482 Chronic atrial fibrillation, unspecified: Secondary | ICD-10-CM

## 2024-03-31 DIAGNOSIS — D631 Anemia in chronic kidney disease: Secondary | ICD-10-CM | POA: Diagnosis not present

## 2024-03-31 DIAGNOSIS — I509 Heart failure, unspecified: Secondary | ICD-10-CM | POA: Diagnosis not present

## 2024-03-31 DIAGNOSIS — Z992 Dependence on renal dialysis: Secondary | ICD-10-CM | POA: Diagnosis not present

## 2024-03-31 DIAGNOSIS — I5023 Acute on chronic systolic (congestive) heart failure: Secondary | ICD-10-CM | POA: Diagnosis not present

## 2024-03-31 DIAGNOSIS — N186 End stage renal disease: Secondary | ICD-10-CM | POA: Diagnosis not present

## 2024-03-31 DIAGNOSIS — I9589 Other hypotension: Secondary | ICD-10-CM | POA: Diagnosis not present

## 2024-03-31 DIAGNOSIS — E1122 Type 2 diabetes mellitus with diabetic chronic kidney disease: Secondary | ICD-10-CM | POA: Diagnosis not present

## 2024-03-31 LAB — CBC
HCT: 36.3 % — ABNORMAL LOW (ref 39.0–52.0)
Hemoglobin: 12 g/dL — ABNORMAL LOW (ref 13.0–17.0)
MCH: 32 pg (ref 26.0–34.0)
MCHC: 33.1 g/dL (ref 30.0–36.0)
MCV: 96.8 fL (ref 80.0–100.0)
Platelets: 85 K/uL — ABNORMAL LOW (ref 150–400)
RBC: 3.75 MIL/uL — ABNORMAL LOW (ref 4.22–5.81)
RDW: 14.6 % (ref 11.5–15.5)
WBC: 5.7 K/uL (ref 4.0–10.5)
nRBC: 0 % (ref 0.0–0.2)

## 2024-03-31 LAB — ECHOCARDIOGRAM COMPLETE
AR max vel: 2.58 cm2
AV Area VTI: 2.94 cm2
AV Area mean vel: 2.64 cm2
AV Mean grad: 3 mmHg
AV Peak grad: 5.5 mmHg
Ao pk vel: 1.17 m/s
Area-P 1/2: 3.63 cm2
Calc EF: 21.4 %
MV VTI: 0.73 cm2
S' Lateral: 4.03 cm
Single Plane A2C EF: 15.3 %
Single Plane A4C EF: 23.4 %

## 2024-03-31 LAB — BASIC METABOLIC PANEL WITH GFR
Anion gap: 10 (ref 5–15)
BUN: 31 mg/dL — ABNORMAL HIGH (ref 8–23)
CO2: 22 mmol/L (ref 22–32)
Calcium: 8.8 mg/dL — ABNORMAL LOW (ref 8.9–10.3)
Chloride: 100 mmol/L (ref 98–111)
Creatinine, Ser: 2.11 mg/dL — ABNORMAL HIGH (ref 0.61–1.24)
GFR, Estimated: 29 mL/min — ABNORMAL LOW (ref >60)
Glucose, Bld: 137 mg/dL — ABNORMAL HIGH (ref 70–99)
Potassium: 4.7 mmol/L (ref 3.5–5.1)
Sodium: 132 mmol/L — ABNORMAL LOW (ref 135–145)

## 2024-03-31 LAB — GLUCOSE, CAPILLARY
Glucose-Capillary: 122 mg/dL — ABNORMAL HIGH (ref 70–99)
Glucose-Capillary: 179 mg/dL — ABNORMAL HIGH (ref 70–99)
Glucose-Capillary: 188 mg/dL — ABNORMAL HIGH (ref 70–99)
Glucose-Capillary: 180 mg/dL — ABNORMAL HIGH (ref 70–99)

## 2024-03-31 LAB — MRSA NEXT GEN BY PCR, NASAL: MRSA by PCR Next Gen: NOT DETECTED

## 2024-03-31 LAB — MAGNESIUM: Magnesium: 2.2 mg/dL (ref 1.7–2.4)

## 2024-03-31 MED ORDER — AMIODARONE HCL IN DEXTROSE 360-4.14 MG/200ML-% IV SOLN
60.0000 mg/h | INTRAVENOUS | Status: AC
  Administered 2024-03-31 (×2): 60 mg/h via INTRAVENOUS
  Filled 2024-03-31 (×2): qty 200

## 2024-03-31 MED ORDER — HYDROXYZINE HCL 10 MG PO TABS
10.0000 mg | ORAL_TABLET | Freq: Three times a day (TID) | ORAL | Status: DC | PRN
  Administered 2024-03-31 – 2024-04-01 (×2): 10 mg via ORAL
  Filled 2024-03-31 (×3): qty 1

## 2024-03-31 MED ORDER — AMIODARONE LOAD VIA INFUSION
150.0000 mg | Freq: Once | INTRAVENOUS | Status: AC
  Administered 2024-03-31: 150 mg via INTRAVENOUS
  Filled 2024-03-31: qty 83.34

## 2024-03-31 MED ORDER — AMIODARONE HCL IN DEXTROSE 360-4.14 MG/200ML-% IV SOLN
30.0000 mg/h | INTRAVENOUS | Status: AC
  Administered 2024-04-01 – 2024-04-02 (×2): 30 mg/h via INTRAVENOUS
  Filled 2024-03-31 (×3): qty 200

## 2024-03-31 MED ORDER — HEPARIN SODIUM (PORCINE) 1000 UNIT/ML IJ SOLN
INTRAMUSCULAR | Status: AC
  Filled 2024-03-31: qty 5

## 2024-03-31 NOTE — Progress Notes (Signed)
 Hemodialysis Note:  Received patient in bed to unit. Alert and oriented. Informed consent singed and in chart.  Treatment initiated: 1635 Treatment completed: 1838  Access used: Right internal jugular catheter Access issues: None  Patient tolerated well. Transported back to room, alert without acute distress. Report given to patient's RN.  Total UF removed: 1.5 Liters Medications given: None  Post HD weight: 79 kg  Ozell [facility]

## 2024-03-31 NOTE — Progress Notes (Signed)
 [facility] Cardiology    SUBJECTIVE: Patient feeling somewhat better heart rate somewhat improved no chest pain no shortness of breath scheduled for additional dialysis   Vitals:   03/30/24 2349 03/31/24 0458 03/31/24 0500 03/31/24 0738  BP: 113/69 97/64  95/70  Pulse: 99 96  100  Resp: 19 19  18   Temp: 97.6 F (36.4 C) 97.7 F (36.5 C)  97.6 F (36.4 C)  TempSrc: Oral Oral  Oral  SpO2: 98% 97%  94%  Weight:   83.2 kg      Intake/Output Summary (Last 24 hours) at 03/31/2024 0901 Last data filed at 03/31/2024 9367 Gross per 24 hour  Intake 330 ml  Output 1850 ml  Net -1520 ml      PHYSICAL EXAM  General: Well developed, well nourished, in no acute distress HEENT:  Normocephalic and atramatic Neck:  No JVD.  Lungs: Clear bilaterally to auscultation and percussion. Heart: Irregular irregular. Normal S1 and S2 without gallops or murmurs.  Abdomen: Bowel sounds are positive, abdomen soft and non-tender  Msk:  Back normal, normal gait. Normal strength and tone for age. Extremities: No clubbing, cyanosis or edema.   Neuro: Alert and oriented X 3. Psych:  Good affect, responds appropriately   LABS: Basic Metabolic Panel: Recent Labs    03/30/24 1708 03/31/24 0451  NA 135 132*  K 5.0 4.7  CL 97* 100  CO2 24 22  GLUCOSE 118* 137*  BUN 20 31*  CREATININE 1.44* 2.11*  CALCIUM  9.0 8.8*  MG  --  2.2  PHOS 2.3*  --    Liver Function Tests: Recent Labs    03/30/24 1708  ALBUMIN  3.5   No results for input(s): LIPASE, AMYLASE in the last 72 hours. CBC: Recent Labs    03/29/24 1552 03/30/24 1708  WBC 5.9 5.4  HGB 12.5* 12.9*  HCT 38.6* 39.4  MCV 97.7 96.3  PLT 85* 85*   Cardiac Enzymes: No results for input(s): CKTOTAL, CKMB, CKMBINDEX, TROPONINI in the last 72 hours. BNP: Invalid input(s): POCBNP D-Dimer: No results for input(s): DDIMER in the last 72 hours. Hemoglobin A1C: Recent Labs    03/30/24 0434  HGBA1C 5.7*   Fasting Lipid  Panel: No results for input(s): CHOL, HDL, LDLCALC, TRIG, CHOLHDL, LDLDIRECT in the last 72 hours. Thyroid  Function Tests: No results for input(s): TSH, T4TOTAL, T3FREE, THYROIDAB in the last 72 hours.  Invalid input(s): FREET3 Anemia Panel: No results for input(s): VITAMINB12, FOLATE, FERRITIN, TIBC, IRON, RETICCTPCT in the last 72 hours.  CT CHEST WO CONTRAST Result Date: 03/29/2024 CLINICAL DATA:  Respiratory illness, nondiagnostic x-ray, atrial fibrillation, generalized weakness EXAM: CT CHEST WITHOUT CONTRAST TECHNIQUE: Multidetector CT imaging of the chest was performed following the standard protocol without IV contrast. RADIATION DOSE REDUCTION: This exam was performed according to the departmental dose-optimization program which includes automated exposure control, adjustment of the mA and/or kV according to patient size and/or use of iterative reconstruction technique. COMPARISON:  03/29/2024 FINDINGS: Cardiovascular: Unenhanced imaging of the heart demonstrates cardiomegaly without pericardial effusion. There is a 6.8 cm ascending thoracic aortic aneurysm. Assessment of the aortic lumen cannot be performed without intravenous contrast. Extensive atherosclerosis throughout the aorta and native coronary vasculature. Postsurgical changes from previous CABG. Right internal jugular catheter tip within the superior vena cava. Mediastinum/Nodes: No enlarged mediastinal or axillary lymph nodes. Thyroid  gland, trachea, and esophagus demonstrate no significant findings. Lungs/Pleura: Small bilateral pleural effusions and dependent atelectasis. No airspace disease or pneumothorax. The central airways are patent. Upper  Abdomen: No acute abnormality. Musculoskeletal: No acute or destructive bony abnormalities. Diffuse thoracic spondylosis. Reconstructed images demonstrate no additional findings. IMPRESSION: 1. 6.8 cm ascending thoracic aortic aneurysm. Assessment of the  aortic lumen cannot be performed without intravenous contrast. Recommend semi-annual imaging followup by CTA or MRA and referral to cardiothoracic surgery if not already obtained. This recommendation follows 2010 ACCF/AHA/AATS/ACR/ASA/SCA/SCAI/SIR/STS/SVM Guidelines for the Diagnosis and Management of Patients With Thoracic Aortic Disease. Circulation. 2010; 121: E266-e369TAA. Aortic aneurysm NOS (ICD10-I71.9) 2. Cardiomegaly without pericardial effusion. 3. Small bilateral pleural effusions with dependent lower lobe atelectasis. 4. Aortic Atherosclerosis (ICD10-I70.0). Coronary artery atherosclerosis. Electronically Signed   By: Ozell Daring M.D.   On: 03/29/2024 20:18   DG Chest 2 View Result Date: 03/29/2024 CLINICAL DATA:  Shortness of breath EXAM: CHEST - 2 VIEW COMPARISON:  Chest radiograph dated 01/10/2023. FINDINGS: Right-sided dialysis catheter with tip over central SVC. Trace bilateral pleural effusions and left lung base atelectasis/scarring. Pneumonia is not excluded. No pneumothorax. Stable cardiac silhouette. Median sternotomy wires. Left axillary vascular stent. No acute osseous pathology. IMPRESSION: Trace bilateral pleural effusions and left lung base atelectasis/scarring. Pneumonia is not excluded. Electronically Signed   By: Vanetta Chou M.D.   On: 03/29/2024 16:29     Echo severely depressed left ventricular function EF around 25 to 30%  TELEMETRY: Atrial fibrillation rate of 95 nonspecific ST-T wave changes:  ASSESSMENT AND PLAN:  Principal Problem:   Acute on chronic HFrEF (heart failure with reduced ejection fraction) (HCC) Active Problems:   Diabetes mellitus, type II (HCC)   CAD with history of CABG and stents (coronary artery disease)   Chronic hypotension   ESRD on dialysis (HCC)   Chronic atrial fibrillation with RVR (HCC)   Aneurysm of thoracic aorta   Plan Acute on chronic systolic congestive heart failure recommend medical therapy GDMT as well as dialysis  to help with congestion and volume overload Continue diabetes management and control Multivessel coronary disease including coronary bypass surgery and stents but no clear evidence of ACS End-stage renal disease on dialysis with accelerated dialysis management and treatments for volume overload and congestion Atrial fibrillation appears to be long-term persistent recommend rate control and anticoagulation Recommend physical therapy to increase activity with his walker Patient states his symptoms are much improved hopefully patient can be treated as an outpatient soon   Cara JONETTA Lovelace, MD 03/31/2024 9:01 AM

## 2024-03-31 NOTE — Plan of Care (Signed)

## 2024-03-31 NOTE — Progress Notes (Signed)
 [facility]  ROUNDING NOTE   Subjective:   Matthew [name]  is a 89 y.o. male with end stage renal disease on hemodialysis, hypotension, hyperlipidemia, coronary artery disease status post CABG, atrial fibrillation, ascending aortic aneurysm, congestive hear failure, and diabetes mellitus type II.  Patient presented to the emergency department complaining of general weakness and has been admitted under observation for CHF exacerbation (HCC) [I50.9] Acute on chronic congestive heart failure, unspecified heart failure type [facility]) [I50.9]  Patient is known to our practice and receives outpatient dialysis treatments at [facility] on a MWF schedule, supervised by Dr. Marcelino.    Patient seen sitting at side of bed Alert Daughter at bedside Respiratory status has improved.  No lower extremity edema  Objective:  Vital signs in last 24 hours:  Temp:  [97.4 F (36.3 C)-97.8 F (36.6 C)] 97.6 F (36.4 C) (11/08 0738) Pulse Rate:  [84-123] 100 (11/08 0738) Resp:  [12-23] 18 (11/08 0738) BP: (89-113)/(62-86) 95/70 (11/08 0738) SpO2:  [94 %-100 %] 94 % (11/08 0738) Weight:  [82.3 kg-83.6 kg] 83.2 kg (11/08 0500)  Weight change:  Filed Weights   03/30/24 1200 03/30/24 1542 03/31/24 0500  Weight: 83.6 kg 82.3 kg 83.2 kg    Intake/Output: I/O last 3 completed shifts: In: 330 [P.O.:330] Out: 2450 [Urine:950; Other:1500]   Intake/Output this shift:  No intake/output data recorded.  Physical Exam: General: NAD  Head: Normocephalic, atraumatic. Moist oral mucosal membranes  Eyes: Anicteric  Lungs:  Exp wheeze, normal effort  Heart: Regular rate and rhythm  Abdomen:  Soft, nontender  Extremities: No peripheral edema.  Neurologic: Awake, alert, conversant  Skin: Warm,dry, no rash  Access: Right IJ PermCath    Basic Metabolic Panel: Recent Labs  Lab 03/29/24 1552 03/30/24 1708 03/31/24 0451  NA 133* 135 132*  K 4.2 5.0 4.7  CL 95* 97* 100  CO2 25 24 22    GLUCOSE 121* 118* 137*  BUN 44* 20 31*  CREATININE 2.36* 1.44* 2.11*  CALCIUM  9.1 9.0 8.8*  MG  --   --  2.2  PHOS  --  2.3*  --     Liver Function Tests: Recent Labs  Lab 03/30/24 1708  ALBUMIN  3.5   No results for input(s): LIPASE, AMYLASE in the last 168 hours. No results for input(s): AMMONIA in the last 168 hours.  CBC: Recent Labs  Lab 03/29/24 1552 03/30/24 1708  WBC 5.9 5.4  HGB 12.5* 12.9*  HCT 38.6* 39.4  MCV 97.7 96.3  PLT 85* 85*    Cardiac Enzymes: No results for input(s): CKTOTAL, CKMB, CKMBINDEX, TROPONINI in the last 168 hours.  BNP: Invalid input(s): POCBNP  CBG: Recent Labs  Lab 03/29/24 2347 03/30/24 0726 03/30/24 1642 03/30/24 2149 03/31/24 0745  GLUCAP 117* 88 108* 174* 122*    Microbiology: Results for orders placed or performed during the hospital encounter of 03/29/24  MRSA Next Gen by PCR, Nasal     Status: None   Collection Time: 03/30/24 10:53 PM   Specimen: Nasal Mucosa; Nasal Swab  Result Value Ref Range Status   MRSA by PCR Next Gen NOT DETECTED NOT DETECTED Final    Comment: (NOTE) The GeneXpert MRSA Assay (FDA approved for NASAL specimens only), is one component of a comprehensive MRSA colonization surveillance program. It is not intended to diagnose MRSA infection nor to guide or monitor treatment for MRSA infections. Test performance is not FDA approved in patients less than 36 years old. Performed at [facility]  Lab, [address] [place], [place] [address]     Coagulation Studies: No results for input(s): LABPROT, INR in the last 72 hours.  Urinalysis: No results for input(s): COLORURINE, LABSPEC, PHURINE, GLUCOSEU, HGBUR, BILIRUBINUR, KETONESUR, PROTEINUR, UROBILINOGEN, NITRITE, LEUKOCYTESUR in the last 72 hours.  Invalid input(s): APPERANCEUR    Imaging: CT CHEST WO CONTRAST Result Date: 03/29/2024 CLINICAL DATA:  Respiratory illness, nondiagnostic  x-ray, atrial fibrillation, generalized weakness EXAM: CT CHEST WITHOUT CONTRAST TECHNIQUE: Multidetector CT imaging of the chest was performed following the standard protocol without IV contrast. RADIATION DOSE REDUCTION: This exam was performed according to the departmental dose-optimization program which includes automated exposure control, adjustment of the mA and/or kV according to patient size and/or use of iterative reconstruction technique. COMPARISON:  03/29/2024 FINDINGS: Cardiovascular: Unenhanced imaging of the heart demonstrates cardiomegaly without pericardial effusion. There is a 6.8 cm ascending thoracic aortic aneurysm. Assessment of the aortic lumen cannot be performed without intravenous contrast. Extensive atherosclerosis throughout the aorta and native coronary vasculature. Postsurgical changes from previous CABG. Right internal jugular catheter tip within the superior vena cava. Mediastinum/Nodes: No enlarged mediastinal or axillary lymph nodes. Thyroid  gland, trachea, and esophagus demonstrate no significant findings. Lungs/Pleura: Small bilateral pleural effusions and dependent atelectasis. No airspace disease or pneumothorax. The central airways are patent. Upper Abdomen: No acute abnormality. Musculoskeletal: No acute or destructive bony abnormalities. Diffuse thoracic spondylosis. Reconstructed images demonstrate no additional findings. IMPRESSION: 1. 6.8 cm ascending thoracic aortic aneurysm. Assessment of the aortic lumen cannot be performed without intravenous contrast. Recommend semi-annual imaging followup by CTA or MRA and referral to cardiothoracic surgery if not already obtained. This recommendation follows 2010 ACCF/AHA/AATS/ACR/ASA/SCA/SCAI/SIR/STS/SVM Guidelines for the Diagnosis and Management of Patients With Thoracic Aortic Disease. Circulation. 2010; 121: E266-e369TAA. Aortic aneurysm NOS (ICD10-I71.9) 2. Cardiomegaly without pericardial effusion. 3. Small bilateral pleural  effusions with dependent lower lobe atelectasis. 4. Aortic Atherosclerosis (ICD10-I70.0). Coronary artery atherosclerosis. Electronically Signed   By: Ozell Daring M.D.   On: 03/29/2024 20:18   DG Chest 2 View Result Date: 03/29/2024 CLINICAL DATA:  Shortness of breath EXAM: CHEST - 2 VIEW COMPARISON:  Chest radiograph dated 01/10/2023. FINDINGS: Right-sided dialysis catheter with tip over central SVC. Trace bilateral pleural effusions and left lung base atelectasis/scarring. Pneumonia is not excluded. No pneumothorax. Stable cardiac silhouette. Median sternotomy wires. Left axillary vascular stent. No acute osseous pathology. IMPRESSION: Trace bilateral pleural effusions and left lung base atelectasis/scarring. Pneumonia is not excluded. Electronically Signed   By: Vanetta Chou M.D.   On: 03/29/2024 16:29     Medications:     aspirin  EC  81 mg Oral Daily   Chlorhexidine  Gluconate Cloth  6 each Topical Q0600   clopidogrel   75 mg Oral Daily   feeding supplement  237 mL Oral BID BM   ferrous sulfate   325 mg Oral BID WC   furosemide   40 mg Intravenous BID   heparin   5,000 Units Subcutaneous Q8H   insulin  aspart  0-5 Units Subcutaneous QHS   insulin  aspart  0-6 Units Subcutaneous TID WC   levothyroxine   100 mcg Oral Q0600   potassium chloride  SA  20 mEq Oral Daily   rosuvastatin   40 mg Oral Daily   acetaminophen  **OR** acetaminophen , HYDROcodone -acetaminophen , ondansetron  **OR** ondansetron  (ZOFRAN ) IV  Assessment/ Plan:  Matthew [name] is a 89 y.o.  male with end stage renal disease on hemodialysis, hypotension, hyperlipidemia, coronary artery disease status post CABG, atrial fibrillation, ascending aortic aneurysm, congestive hear failure, and diabetes mellitus  type II.Patient presents with weakness and his admitted under observation for CHF exacerbation (HCC) [I50.9] Acute on chronic congestive heart failure, unspecified heart failure type (HCC) [I50.9]    [facility] MWF L AVF   Acute on chronic heart failure  with bilateral pleural effusions seen on chest xray and CT chest. Mild shortness of breath. BNP 746. Last ECHO on 01/11/23 showed EF 35-40%, updated ECHO pending. IV furosemide  40mg  ordered. Awaiting cardiology consult.   2.  End-stage renal disease on hemodialysis.  Last treatment received on Wednesday.  Received dialysis yesterday, UF 1.5L achieved. Offered extra treatment today, patient declined. Next treatment scheduled for Monday  3.  Atrial fibrillation, chronic.  Mild heart rate elevation.  Patient prescribed amiodarone  outpatient however has not started medication.  Awaiting cardiology consult.  4. Anemia of chronic kidney disease Lab Results  Component Value Date   HGB 12.9 (L) 03/30/2024    Hemoglobin optimal.  No need for ESA's at this time    LOS: 0 Matthew [name] 11/8/202510:35 AM

## 2024-03-31 NOTE — Evaluation (Signed)
 Physical Therapy Evaluation Patient Details Name: Matthew [name] MRN: [id number] DOB: [birth year] Today's Date: 03/31/2024  History of Present Illness  per chart review, Matthew [name] is a 89 y.o. male with medical history significant for CAD s/p CABG and stents, ESRD on HD MWF, atrial fibrillation(not on [facility], previously on amiodarone ), ascending aortic aneurysm, HFrEF(EF 30-35% 12/2022), HTN, HLD,DM, being admitted for CHF exacerbation after presenting with a history of  generalized weakness, shortness of breath.  Clinical Impression  89 yo Male reports increased shortness of breath as a result of fluid overload. Patient lives at home with his daughter who is his primary caretaker. Daughter does all cooking and cleaning. Patient reports being mod I for dressing and self care ADLs, sponge bathes at this time. He is using a RW at baseline. He demonstrates generalized weakness (4/5 strength) in BLE. He is mod I for bed mobility requiring bed rail, elevated head of bed and increased effort. Patient requires supervision for sit<>stand transfers using RW for safety. He ambulated 100 feet x2 laps with RW, demonstrating reciprocal gait pattern, normal base of support, flexed posture. 10 feet walk test was 1.19 feet/sec indicating limited home ambulator. Vitals monitored throughout session,Spo2 100% after walking 100 feet on room air. Patient does report mild shortness of breath but states its not worse than prior to walking. He would benefit from additional skilled PT intervention for LE strengthening, balance and mobility.       If plan is discharge home, recommend the following: A little help with walking and/or transfers;A little help with bathing/dressing/bathroom;Assistance with cooking/housework;Assist for transportation;Help with stairs or ramp for entrance   Can travel by private vehicle        Equipment Recommendations None recommended by PT  Recommendations for Other Services       Functional  Status Assessment Patient has had a recent decline in their functional status and demonstrates the ability to make significant improvements in function in a reasonable and predictable amount of time.     Precautions / Restrictions Precautions Precautions: Fall Restrictions Weight Bearing Restrictions Per Provider Order: No      Mobility  Bed Mobility Overal bed mobility: Modified Independent             General bed mobility comments: uses bed rails, elevated head of bed with extra effort to transition supine to sit    Transfers Overall transfer level: Needs assistance Equipment used: Rolling walker (2 wheels) Transfers: Sit to/from Stand Sit to Stand: Supervision           General transfer comment: demonstrates good hand placement, but has difficulty transferring from low seat, requiring close supervision/stand by assist for safety; requires extra effort/time    Ambulation/Gait Ambulation/Gait assistance: Contact guard assist Gait Distance (Feet): 100 Feet (pt ambulated 100 feet x2  reps) Assistive device: Rolling walker (2 wheels) Gait Pattern/deviations: Step-through pattern, Trunk flexed Gait velocity: 10 feet walk speed: 1.19 feet/sec Gait velocity interpretation: <1.31 ft/sec, indicative of household ambulator   General Gait Details: demonstrates flexed posture, reciprocal gait pattern, normal base of support;  Stairs            Wheelchair Mobility     Tilt Bed    Modified Rankin (Stroke Patients Only)       Balance Overall balance assessment: Needs assistance Sitting-balance support: Feet supported Sitting balance-Leahy Scale: Good     Standing balance support: Bilateral upper extremity supported, Reliant on assistive device for balance Standing balance-Leahy Scale: Fair Standing balance comment:  pt uses RW when standing; requires CGA when ambulating                             Pertinent Vitals/Pain Pain Assessment Pain  Assessment: 0-10 Pain Score: 0-No pain    Home Living Family/patient expects to be discharged to:: Private residence Living Arrangements: Children (daughter) Available Help at Discharge: Family;Available PRN/intermittently Type of Home: House Home Access: Stairs to enter   [employer] of Steps: 3 at driveway; ramped entrance to [employer] Layout: Able to live on main level with bedroom/bathroom Home Equipment: Rollator (4 wheels);Cane - single point;Shower seat;Grab bars - toilet;Grab bars - tub/shower;Wheelchair - manual Additional Comments: Lives with daughter and son-in-law    Prior Function Prior Level of Function : Independent/Modified Independent             Mobility Comments: ambulating short distances at home with rollator/RW; ADLs Comments: sponge bathes, daughter reports he is mod I for dressing and self care ADLs     Extremity/Trunk Assessment   Upper Extremity Assessment Upper Extremity Assessment: Overall WFL for tasks assessed    Lower Extremity Assessment Lower Extremity Assessment: Generalized weakness    Cervical / Trunk Assessment Cervical / Trunk Assessment: Kyphotic  Communication   Communication Communication: No apparent difficulties    Cognition Arousal: Alert Behavior During Therapy: WFL for tasks assessed/performed   PT - Cognitive impairments: No apparent impairments                       PT - Cognition Comments: oriented x4 Following commands: Intact       Cueing Cueing Techniques: Verbal cues     General Comments General comments (skin integrity, edema, etc.): vitals monitored throughout session; at rest: Spo2 95%, HR 118; after walking 100 feet, Spo2 100%, HR 90; while on room air.    Exercises Other Exercises Other Exercises: Educated patient in role of PT and recommendations Other Exercises: while sitting, instructed patient in LE strengthening/ROM exercise: LAQ x10 reps bilaterally, ankle pumps x10  bilaterally; Requires min Vcs to increase ROM and slow down movement for better muscle activation;   Assessment/Plan    PT Assessment Patient needs continued PT services  PT Problem List Cardiopulmonary status limiting activity;Decreased activity tolerance;Decreased balance;Decreased mobility       PT Treatment Interventions Balance training;Gait training;Stair training;Functional mobility training;Patient/family education;Therapeutic activities;Therapeutic exercise    PT Goals (Current goals can be found in the Care Plan section)  Acute Rehab PT Goals Patient Stated Goal: to get stronger PT Goal Formulation: With patient Time For Goal Achievement: 04/14/24 Potential to Achieve Goals: Good    Frequency Min 2X/week     Co-evaluation               AM-PAC PT 6 Clicks Mobility  Outcome Measure Help needed turning from your back to your side while in a flat bed without using bedrails?: None Help needed moving from lying on your back to sitting on the side of a flat bed without using bedrails?: None Help needed moving to and from a bed to a chair (including a wheelchair)?: A Little Help needed standing up from a chair using your arms (e.g., wheelchair or bedside chair)?: A Little Help needed to walk in hospital room?: A Little Help needed climbing 3-5 steps with a railing? : A Little 6 Click Score: 20    End of Session Equipment Utilized During Treatment:  Gait belt Activity Tolerance: Patient tolerated treatment well Patient left: in chair;with call bell/phone within reach;with chair alarm set;with family/visitor present Nurse Communication: Mobility status PT Visit Diagnosis: Other abnormalities of gait and mobility (R26.89);Muscle weakness (generalized) (M62.81)    Time: 1440-1510 PT Time Calculation (min) (ACUTE ONLY): 30 min   Charges:   PT Evaluation $PT Eval Low Complexity: 1 Low PT Treatments $Therapeutic Exercise: 8-22 mins PT General Charges $$ ACUTE PT  VISIT: 1 Visit      Deoni Cosey PT, DPT 03/31/2024, 3:32 PM

## 2024-03-31 NOTE — Progress Notes (Signed)
 PROGRESS NOTE    Matthew [name]  FMW:[id number] DOB: 1930-07-15 DOA: 03/29/2024 PCP: Albina GORMAN Dine, MD  Outpatient Specialists: cardiology    Brief Narrative:   From admission h and p  Matthew [name] is a 89 y.o. male with medical history significant for CAD s/p CABG and stents, ESRD on HD MWF, atrial fibrillation(not on [facility], previously on amiodarone ), ascending aortic aneurysm, HFrEF(EF 30-35% 12/2022), HTN, HLD,DM, being admitted for CHF exacerbation after presenting with a history of  generalized weakness, shortness of breath.  He denies chest pain or leg swelling.  He recently saw his cardiologist, Dr. Fernand who ordered an echocardiogram (scheduled for 04/03/24) and  placed him back on amiodarone  but it has not yet been filled (note not found in Care Everywhere) In the ED, afebrile, heart rate 99-108.  O2 sats 98% on room air and normotensive.  11/8: Remained in A-fib with mild RVR, going for extra session of dialysis today. Cardiology is on board-planning to start him on amiodarone .  Assessment & Plan:   Principal Problem:   Acute on chronic HFrEF (heart failure with reduced ejection fraction) (HCC) Active Problems:   Chronic atrial fibrillation with RVR (HCC)   CAD with history of CABG and stents (coronary artery disease)   Chronic hypotension   ESRD on dialysis (HCC)   Diabetes mellitus, type II (HCC)   Aneurysm of thoracic aorta  Acute on chronic HFrEF (heart failure with reduced ejection fraction) (HCC) Bilateral pleural effusion Patient with shortness of breath appears mildly fluid overloaded, has elevated BNP of 746 Extra session of dialysis today, feeling improved Will continue Lasix  40 mg twice daily as patient still produces urine Daily weights with intake and output monitoring Echocardiogram with EF of 25 to 30%, dilated cardiomyopathy, moderate to severe MR, normal structure and function of prosthetic AV. Cardiology has been consulted.   Chronic atrial  fibrillation with RVR (HCC) Mildly increased ventricular rate, 99-110 Continue aspirin  .Not currently on anticoagulation Plan by cardiology to restart amiodarone , not yet filled -[facility] cardiology is going to start amiodarone  today   CAD with history of CABG and stents (coronary artery disease) Elevated troponin (flat trend 52-> 52) No complaints of chest pain, EKG nonacute and troponins slightly elevated but flat 52-52 likely demand ischemia  Continue aspirin  and rosuvastatin  as well as nitroglycerin  sublingual as needed chest pain   ESRD on dialysis [facility]) Nephrology consulted for continuation of dialysis,  - Going for an extra session of dialysis today  Diabetes mellitus, type II (HCC) Sliding scale insulin  coverage   Aneurysm of thoracic aorta 6.8 cm.  Recommendation for semiannual imaging  DVT prophylaxis: heparin  Code Status: DNR Family Communication: Discussed with daughter at bedside  Level of care: Progressive Status is: Observation  Consultants:  Nephrology Cardiology  Procedures: hemodialysis  Antimicrobials:  none   Subjective: Patient was seen and examined today.  Feeling little weak and some shortness of breath stating it is better than before.  Now on room air.  Objective: Vitals:   03/31/24 1500 03/31/24 1510 03/31/24 1622 03/31/24 1635  BP:   99/77 110/78  Pulse: (!) 118 90 98 (!) 107  Resp:   16 19  Temp:   (!) 97.5 F (36.4 C)   TempSrc:   Axillary   SpO2: 95% 100% 92% 96%  Weight:   80.5 kg     Intake/Output Summary (Last 24 hours) at 03/31/2024 1701 Last data filed at 03/31/2024 9367 Gross per 24 hour  Intake 330 ml  Output 350 ml  Net -20 ml   Filed Weights   03/30/24 1542 03/31/24 0500 03/31/24 1622  Weight: 82.3 kg 83.2 kg 80.5 kg    Examination:  General.  Frail elderly man, in no acute distress. Pulmonary.  Lungs clear bilaterally, normal respiratory effort. CV.  Irregularly irregular Abdomen.  Soft, nontender,  nondistended, BS positive. CNS.  Alert and oriented .  No focal neurologic deficit. Extremities.  No edema, no cyanosis, pulses intact and symmetrical. Psychiatry.  Judgment and insight appears normal.   Data Reviewed: I have personally reviewed following labs and imaging studies  CBC: Recent Labs  Lab 03/29/24 1552 03/30/24 1708 03/31/24 1634  WBC 5.9 5.4 5.7  HGB 12.5* 12.9* 12.0*  HCT 38.6* 39.4 36.3*  MCV 97.7 96.3 96.8  PLT 85* 85* 85*   Basic Metabolic Panel: Recent Labs  Lab 03/29/24 1552 03/30/24 1708 03/31/24 0451  NA 133* 135 132*  K 4.2 5.0 4.7  CL 95* 97* 100  CO2 25 24 22   GLUCOSE 121* 118* 137*  BUN 44* 20 31*  CREATININE 2.36* 1.44* 2.11*  CALCIUM  9.1 9.0 8.8*  MG  --   --  2.2  PHOS  --  2.3*  --    GFR: Estimated Creatinine Clearance: 23.5 mL/min (A) (by C-G formula based on SCr of 2.11 mg/dL (H)). Liver Function Tests: Recent Labs  Lab 03/30/24 1708  ALBUMIN  3.5   No results for input(s): LIPASE, AMYLASE in the last 168 hours. No results for input(s): AMMONIA in the last 168 hours. Coagulation Profile: No results for input(s): INR, PROTIME in the last 168 hours. Cardiac Enzymes: No results for input(s): CKTOTAL, CKMB, CKMBINDEX, TROPONINI in the last 168 hours. BNP (last 3 results) No results for input(s): PROBNP in the last 8760 hours. HbA1C: Recent Labs    03/30/24 0434  HGBA1C 5.7*   CBG: Recent Labs  Lab 03/30/24 0726 03/30/24 1642 03/30/24 2149 03/31/24 0745 03/31/24 1212  GLUCAP 88 108* 174* 122* 179*   Lipid Profile: No results for input(s): CHOL, HDL, LDLCALC, TRIG, CHOLHDL, LDLDIRECT in the last 72 hours. Thyroid  Function Tests: No results for input(s): TSH, T4TOTAL, FREET4, T3FREE, THYROIDAB in the last 72 hours. Anemia Panel: No results for input(s): VITAMINB12, FOLATE, FERRITIN, TIBC, IRON, RETICCTPCT in the last 72 hours. Urine analysis:    Component Value  Date/Time   COLORURINE YELLOW (A) 05/24/2019 0021   APPEARANCEUR CLEAR (A) 05/24/2019 0021   LABSPEC 1.012 05/24/2019 0021   PHURINE 6.0 05/24/2019 0021   GLUCOSEU NEGATIVE 05/24/2019 0021   HGBUR NEGATIVE 05/24/2019 0021   BILIRUBINUR Negative 10/15/2022 1202   KETONESUR NEGATIVE 05/24/2019 0021   PROTEINUR Positive (A) 10/15/2022 1202   PROTEINUR 30 (A) 05/24/2019 0021   UROBILINOGEN 0.2 10/15/2022 1202   NITRITE Negative 10/15/2022 1202   NITRITE NEGATIVE 05/24/2019 0021   LEUKOCYTESUR Large (3+) (A) 10/15/2022 1202   LEUKOCYTESUR NEGATIVE 05/24/2019 0021   Sepsis Labs: @LABRCNTIP (procalcitonin:4,lacticidven:4)  ) Recent Results (from the past 240 hours)  MRSA Next Gen by PCR, Nasal     Status: None   Collection Time: 03/30/24 10:53 PM   Specimen: Nasal Mucosa; Nasal Swab  Result Value Ref Range Status   MRSA by PCR Next Gen NOT DETECTED NOT DETECTED Final    Comment: (NOTE) The GeneXpert MRSA Assay (FDA approved for NASAL specimens only), is one component of a comprehensive MRSA colonization surveillance program. It is not intended to diagnose MRSA infection nor to guide or monitor treatment for  MRSA infections. Test performance is not FDA approved in patients less than 57 years old. Performed at [facility], [address] [place], [place] [address]          Radiology Studies: ECHOCARDIOGRAM COMPLETE Result Date: 03/31/2024    ECHOCARDIOGRAM REPORT   Patient Name:   RUSTIN ERHART Date of Exam: 03/30/2024 Medical Rec #:  [id number]     Height:       70.1 in Accession #:    [id number]    Weight:       191.1 lb Date of Birth:  1931/01/15      BSA:          2.049 m Patient Age:    89 years      BP:           97/64 mmHg Patient Gender: M             HR:           119 bpm. Exam Location:  [facility] Procedure: 2D Echo, Cardiac Doppler and Color Doppler (Both Spectral and Color            Flow Doppler were utilized during procedure). Indications:     CHF-Acute Systolic  I50.21  History:         Patient has no prior history of Echocardiogram examinations.                  CHF; Arrythmias:Atrial Fibrillation.  Sonographer:     Ashley McNeely-Sloane Referring Phys:  Delayne Solian MD Diagnosing Phys: Cara JONETTA Lovelace MD IMPRESSIONS  1. Prosthetic AoV.  2. Left ventricular ejection fraction, by estimation, is 25 to 30%. The left ventricle has severely decreased function. The left ventricle demonstrates global hypokinesis. The left ventricular internal cavity size was moderately dilated. Left ventricular diastolic function could not be evaluated.  3. Right ventricular systolic function is mildly reduced. The right ventricular size is moderately enlarged. Moderately increased right ventricular wall thickness.  4. Left atrial size was moderately dilated.  5. Right atrial size was mildly dilated.  6. The mitral valve is normal in structure. Moderate to severe mitral valve regurgitation.  7. The aortic valve is calcified. Aortic valve regurgitation is mild to moderate. Aortic valve sclerosis/calcification is present, without any evidence of aortic stenosis. Echo findings are consistent with normal structure and function of the aortic valve prosthesis. FINDINGS  Left Ventricle: Left ventricular ejection fraction, by estimation, is 25 to 30%. The left ventricle has severely decreased function. The left ventricle demonstrates global hypokinesis. Strain was performed and the global longitudinal strain is indeterminate. The left ventricular internal cavity size was moderately dilated. There is borderline left ventricular hypertrophy. Left ventricular diastolic function could not be evaluated. Right Ventricle: The right ventricular size is moderately enlarged. Moderately increased right ventricular wall thickness. Right ventricular systolic function is mildly reduced. Left Atrium: Left atrial size was moderately dilated. Right Atrium: Right atrial size was mildly dilated. Pericardium: There is no  evidence of pericardial effusion. Mitral Valve: The mitral valve is normal in structure. Moderate to severe mitral valve regurgitation. MV peak gradient, 31.2 mmHg. The mean mitral valve gradient is 25.0 mmHg. Tricuspid Valve: The tricuspid valve is grossly normal. Tricuspid valve regurgitation is mild. Aortic Valve: The aortic valve is calcified. Aortic valve regurgitation is mild to moderate. Aortic valve sclerosis/calcification is present, without any evidence of aortic stenosis. Aortic valve mean gradient measures 3.0 mmHg. Aortic valve peak gradient measures 5.5 mmHg. Aortic valve area,  by VTI measures 2.94 cm. Echo findings are consistent with normal structure and function of the aortic valve prosthesis. Pulmonic Valve: The pulmonic valve was normal in structure. Pulmonic valve regurgitation is not visualized. Aorta: The ascending aorta was not well visualized. IAS/Shunts: No atrial level shunt detected by color flow Doppler. Additional Comments: Prosthetic AoV. 3D was performed not requiring image post processing on an independent workstation and was indeterminate.  LEFT VENTRICLE PLAX 2D LVIDd:         5.21 cm      Diastology LVIDs:         4.03 cm      LV e' medial:    5.92 cm/s LV PW:         1.10 cm      LV E/e' medial:  19.1 LV IVS:        1.13 cm      LV e' lateral:   11.10 cm/s LVOT diam:     2.20 cm      LV E/e' lateral: 10.2 LV SV:         54 LV SV Index:   27 LVOT Area:     3.80 cm  LV Volumes (MOD) LV vol d, MOD A2C: 150.0 ml LV vol d, MOD A4C: 141.0 ml LV vol s, MOD A2C: 127.0 ml LV vol s, MOD A4C: 108.0 ml LV SV MOD A2C:     23.0 ml LV SV MOD A4C:     141.0 ml LV SV MOD BP:      32.3 ml RIGHT VENTRICLE            IVC RV Basal diam:  6.23 cm    IVC diam: 2.45 cm RV Mid diam:    4.42 cm RV S prime:     5.98 cm/s TAPSE (M-mode): 0.7 cm LEFT ATRIUM           Index        RIGHT ATRIUM           Index LA diam:      5.00 cm 2.44 cm/m   RA Area:     28.70 cm LA Vol (A2C): 71.2 ml 34.75 ml/m  RA  Volume:   104.00 ml 50.75 ml/m LA Vol (A4C): 44.4 ml 21.67 ml/m  AORTIC VALVE                    PULMONIC VALVE AV Area (Vmax):    2.58 cm     PV Vmax:        0.71 m/s AV Area (Vmean):   2.64 cm     PV Vmean:       44.700 cm/s AV Area (VTI):     2.94 cm     PV VTI:         0.109 m AV Vmax:           117.00 cm/s  PV Peak grad:   2.0 mmHg AV Vmean:          85.800 cm/s  PV Mean grad:   1.0 mmHg AV VTI:            0.185 m      RVOT Peak grad: 1 mmHg AV Peak Grad:      5.5 mmHg AV Mean Grad:      3.0 mmHg LVOT Vmax:         79.30 cm/s LVOT Vmean:        59.500 cm/s LVOT VTI:  0.143 m LVOT/AV VTI ratio: 0.77  AORTA Ao Root diam: 3.80 cm Ao Asc diam:  5.90 cm MITRAL VALVE MV Area (PHT): 3.63 cm     SHUNTS MV Area VTI:   0.73 cm     Systemic VTI:  0.14 m MV Peak grad:  31.2 mmHg    Systemic Diam: 2.20 cm MV Mean grad:  25.0 mmHg    Pulmonic VTI:  0.061 m MV Vmax:       2.80 m/s MV Vmean:      202.2 cm/s MV Decel Time: 209 msec MV E velocity: 113.00 cm/s Cara JONETTA Lovelace MD Electronically signed by Cara JONETTA Lovelace MD Signature Date/Time: 03/31/2024/1:30:27 PM    Final    CT CHEST WO CONTRAST Result Date: 03/29/2024 CLINICAL DATA:  Respiratory illness, nondiagnostic x-ray, atrial fibrillation, generalized weakness EXAM: CT CHEST WITHOUT CONTRAST TECHNIQUE: Multidetector CT imaging of the chest was performed following the standard protocol without IV contrast. RADIATION DOSE REDUCTION: This exam was performed according to the departmental dose-optimization program which includes automated exposure control, adjustment of the mA and/or kV according to patient size and/or use of iterative reconstruction technique. COMPARISON:  03/29/2024 FINDINGS: Cardiovascular: Unenhanced imaging of the heart demonstrates cardiomegaly without pericardial effusion. There is a 6.8 cm ascending thoracic aortic aneurysm. Assessment of the aortic lumen cannot be performed without intravenous contrast. Extensive atherosclerosis  throughout the aorta and native coronary vasculature. Postsurgical changes from previous CABG. Right internal jugular catheter tip within the superior vena cava. Mediastinum/Nodes: No enlarged mediastinal or axillary lymph nodes. Thyroid  gland, trachea, and esophagus demonstrate no significant findings. Lungs/Pleura: Small bilateral pleural effusions and dependent atelectasis. No airspace disease or pneumothorax. The central airways are patent. Upper Abdomen: No acute abnormality. Musculoskeletal: No acute or destructive bony abnormalities. Diffuse thoracic spondylosis. Reconstructed images demonstrate no additional findings. IMPRESSION: 1. 6.8 cm ascending thoracic aortic aneurysm. Assessment of the aortic lumen cannot be performed without intravenous contrast. Recommend semi-annual imaging followup by CTA or MRA and referral to cardiothoracic surgery if not already obtained. This recommendation follows 2010 ACCF/AHA/AATS/ACR/ASA/SCA/SCAI/SIR/STS/SVM Guidelines for the Diagnosis and Management of Patients With Thoracic Aortic Disease. Circulation. 2010; 121: E266-e369TAA. Aortic aneurysm NOS (ICD10-I71.9) 2. Cardiomegaly without pericardial effusion. 3. Small bilateral pleural effusions with dependent lower lobe atelectasis. 4. Aortic Atherosclerosis (ICD10-I70.0). Coronary artery atherosclerosis. Electronically Signed   By: Ozell Daring M.D.   On: 03/29/2024 20:18        Scheduled Meds:  aspirin  EC  81 mg Oral Daily   Chlorhexidine  Gluconate Cloth  6 each Topical Q0600   clopidogrel   75 mg Oral Daily   feeding supplement  237 mL Oral BID BM   ferrous sulfate   325 mg Oral BID WC   furosemide   40 mg Intravenous BID   heparin   5,000 Units Subcutaneous Q8H   insulin  aspart  0-5 Units Subcutaneous QHS   insulin  aspart  0-6 Units Subcutaneous TID WC   levothyroxine   100 mcg Oral Q0600   potassium chloride  SA  20 mEq Oral Daily   rosuvastatin   40 mg Oral Daily   Continuous Infusions:   LOS: 0  days   This record has been created using Conservation officer, historic buildings. Errors have been sought and corrected,but may not always be located. Such creation errors do not reflect on the standard of care.   Amaryllis Dare, MD [facility]   If 7PM-7AM, please contact night-coverage [email or web address] Password [email or web address] 03/31/2024, 5:01 PM

## 2024-03-31 NOTE — Progress Notes (Signed)
 ccmd called regarding 8 beats of non-sustained V tach, patient asymptomatic no complain of chest pain. Vitals normal. Dr. Delayne Solian made aware ordered magnesium  level.

## 2024-04-01 DIAGNOSIS — I429 Cardiomyopathy, unspecified: Secondary | ICD-10-CM | POA: Diagnosis not present

## 2024-04-01 DIAGNOSIS — I5023 Acute on chronic systolic (congestive) heart failure: Secondary | ICD-10-CM | POA: Diagnosis not present

## 2024-04-01 DIAGNOSIS — I420 Dilated cardiomyopathy: Secondary | ICD-10-CM | POA: Diagnosis present

## 2024-04-01 DIAGNOSIS — I132 Hypertensive heart and chronic kidney disease with heart failure and with stage 5 chronic kidney disease, or end stage renal disease: Secondary | ICD-10-CM | POA: Diagnosis not present

## 2024-04-01 DIAGNOSIS — M1990 Unspecified osteoarthritis, unspecified site: Secondary | ICD-10-CM | POA: Diagnosis present

## 2024-04-01 DIAGNOSIS — R0602 Shortness of breath: Secondary | ICD-10-CM | POA: Diagnosis present

## 2024-04-01 DIAGNOSIS — I4819 Other persistent atrial fibrillation: Secondary | ICD-10-CM | POA: Diagnosis present

## 2024-04-01 DIAGNOSIS — I482 Chronic atrial fibrillation, unspecified: Secondary | ICD-10-CM | POA: Diagnosis not present

## 2024-04-01 DIAGNOSIS — I509 Heart failure, unspecified: Secondary | ICD-10-CM | POA: Diagnosis not present

## 2024-04-01 DIAGNOSIS — D631 Anemia in chronic kidney disease: Secondary | ICD-10-CM | POA: Diagnosis not present

## 2024-04-01 DIAGNOSIS — Z8701 Personal history of pneumonia (recurrent): Secondary | ICD-10-CM | POA: Diagnosis not present

## 2024-04-01 DIAGNOSIS — I2489 Other forms of acute ischemic heart disease: Secondary | ICD-10-CM | POA: Diagnosis present

## 2024-04-01 DIAGNOSIS — I2510 Atherosclerotic heart disease of native coronary artery without angina pectoris: Secondary | ICD-10-CM | POA: Diagnosis present

## 2024-04-01 DIAGNOSIS — I9589 Other hypotension: Secondary | ICD-10-CM | POA: Diagnosis not present

## 2024-04-01 DIAGNOSIS — E875 Hyperkalemia: Secondary | ICD-10-CM | POA: Diagnosis present

## 2024-04-01 DIAGNOSIS — E785 Hyperlipidemia, unspecified: Secondary | ICD-10-CM | POA: Diagnosis present

## 2024-04-01 DIAGNOSIS — I480 Paroxysmal atrial fibrillation: Secondary | ICD-10-CM | POA: Diagnosis not present

## 2024-04-01 DIAGNOSIS — Z79890 Hormone replacement therapy (postmenopausal): Secondary | ICD-10-CM | POA: Diagnosis not present

## 2024-04-01 DIAGNOSIS — E1122 Type 2 diabetes mellitus with diabetic chronic kidney disease: Secondary | ICD-10-CM | POA: Diagnosis present

## 2024-04-01 DIAGNOSIS — I5043 Acute on chronic combined systolic (congestive) and diastolic (congestive) heart failure: Secondary | ICD-10-CM | POA: Diagnosis present

## 2024-04-01 DIAGNOSIS — E782 Mixed hyperlipidemia: Secondary | ICD-10-CM | POA: Diagnosis not present

## 2024-04-01 DIAGNOSIS — N186 End stage renal disease: Secondary | ICD-10-CM | POA: Diagnosis present

## 2024-04-01 DIAGNOSIS — I4891 Unspecified atrial fibrillation: Secondary | ICD-10-CM | POA: Diagnosis not present

## 2024-04-01 DIAGNOSIS — Z7902 Long term (current) use of antithrombotics/antiplatelets: Secondary | ICD-10-CM | POA: Diagnosis not present

## 2024-04-01 DIAGNOSIS — Z7901 Long term (current) use of anticoagulants: Secondary | ICD-10-CM | POA: Diagnosis not present

## 2024-04-01 DIAGNOSIS — Z87891 Personal history of nicotine dependence: Secondary | ICD-10-CM | POA: Diagnosis not present

## 2024-04-01 DIAGNOSIS — R54 Age-related physical debility: Secondary | ICD-10-CM | POA: Diagnosis present

## 2024-04-01 DIAGNOSIS — Z66 Do not resuscitate: Secondary | ICD-10-CM | POA: Diagnosis present

## 2024-04-01 DIAGNOSIS — I959 Hypotension, unspecified: Secondary | ICD-10-CM | POA: Diagnosis not present

## 2024-04-01 DIAGNOSIS — Z7982 Long term (current) use of aspirin: Secondary | ICD-10-CM | POA: Diagnosis not present

## 2024-04-01 DIAGNOSIS — I7121 Aneurysm of the ascending aorta, without rupture: Secondary | ICD-10-CM | POA: Diagnosis present

## 2024-04-01 DIAGNOSIS — I252 Old myocardial infarction: Secondary | ICD-10-CM | POA: Diagnosis not present

## 2024-04-01 DIAGNOSIS — Z7984 Long term (current) use of oral hypoglycemic drugs: Secondary | ICD-10-CM | POA: Diagnosis not present

## 2024-04-01 DIAGNOSIS — Z992 Dependence on renal dialysis: Secondary | ICD-10-CM | POA: Diagnosis not present

## 2024-04-01 DIAGNOSIS — I25810 Atherosclerosis of coronary artery bypass graft(s) without angina pectoris: Secondary | ICD-10-CM | POA: Diagnosis not present

## 2024-04-01 LAB — BASIC METABOLIC PANEL WITH GFR
Anion gap: 16 — ABNORMAL HIGH (ref 5–15)
BUN: 49 mg/dL — ABNORMAL HIGH (ref 8–23)
CO2: 21 mmol/L — ABNORMAL LOW (ref 22–32)
Calcium: 9 mg/dL (ref 8.9–10.3)
Chloride: 96 mmol/L — ABNORMAL LOW (ref 98–111)
Creatinine, Ser: 2.84 mg/dL — ABNORMAL HIGH (ref 0.61–1.24)
GFR, Estimated: 20 mL/min — ABNORMAL LOW (ref >60)
Glucose, Bld: 167 mg/dL — ABNORMAL HIGH (ref 70–99)
Potassium: 5.4 mmol/L — ABNORMAL HIGH (ref 3.5–5.1)
Sodium: 133 mmol/L — ABNORMAL LOW (ref 135–145)

## 2024-04-01 LAB — GLUCOSE, CAPILLARY
Glucose-Capillary: 151 mg/dL — ABNORMAL HIGH (ref 70–99)
Glucose-Capillary: 177 mg/dL — ABNORMAL HIGH (ref 70–99)
Glucose-Capillary: 144 mg/dL — ABNORMAL HIGH (ref 70–99)
Glucose-Capillary: 184 mg/dL — ABNORMAL HIGH (ref 70–99)

## 2024-04-01 MED ORDER — HEPARIN BOLUS VIA INFUSION
4100.0000 [IU] | Freq: Once | INTRAVENOUS | Status: AC
  Administered 2024-04-01: 4100 [IU] via INTRAVENOUS
  Filled 2024-04-01: qty 4100

## 2024-04-01 MED ORDER — HEPARIN (PORCINE) 25000 UT/250ML-% IV SOLN
1200.0000 [IU]/h | INTRAVENOUS | Status: DC
  Administered 2024-04-01: 1200 [IU]/h via INTRAVENOUS
  Filled 2024-04-01: qty 250

## 2024-04-01 NOTE — Progress Notes (Signed)
 Patient ID: EDDER BELLANCA, male   DOB: 1930-11-24, 89 y.o.   MRN: [id number] [facility]    SUBJECTIVE: Patient somewhat lethargic weak fatigue would like to complete most of his workup as an outpatient so we will try to proceed with inpatient TEE cardioversion tomorrow prior to discharge and adjust his heart failure medications.  Hopefully after cardioversion he should be able to be discharged tomorrow   Vitals:   04/01/24 0200 04/01/24 0400 04/01/24 0500 04/01/24 0807  BP: 108/65 (!) 100/59  (!) 88/62  Pulse: 90 89  84  Resp:  19  16  Temp:  97.8 F (36.6 C)  97.9 F (36.6 C)  TempSrc:  Oral  Oral  SpO2:  100%  100%  Weight:   82.4 kg      Intake/Output Summary (Last 24 hours) at 04/01/2024 1417 Last data filed at 04/01/2024 1300 Gross per 24 hour  Intake 604.89 ml  Output 1800 ml  Net -1195.11 ml      PHYSICAL EXAM  General: Well developed, well nourished, in no acute distress HEENT:  Normocephalic and atramatic Neck:  No JVD.  Lungs: Clear bilaterally to auscultation and percussion. Heart: Irregular irregular. Normal S1 and S2 without gallops or murmurs.  Abdomen: Bowel sounds are positive, abdomen soft and non-tender  Msk:  Back normal, normal gait. Normal strength and tone for age. Extremities: No clubbing, cyanosis or edema.   Neuro: Alert and oriented X 3. Psych:  Good affect, responds appropriately   LABS: Basic Metabolic Panel: Recent Labs    03/30/24 1708 03/31/24 0451 04/01/24 0411  NA 135 132* 133*  K 5.0 4.7 5.4*  CL 97* 100 96*  CO2 24 22 21*  GLUCOSE 118* 137* 167*  BUN 20 31* 49*  CREATININE 1.44* 2.11* 2.84*  CALCIUM  9.0 8.8* 9.0  MG  --  2.2  --   PHOS 2.3*  --   --    Liver Function Tests: Recent Labs    03/30/24 1708  ALBUMIN  3.5   No results for input(s): LIPASE, AMYLASE in the last 72 hours. CBC: Recent Labs    03/30/24 1708 03/31/24 1634  WBC 5.4 5.7  HGB 12.9* 12.0*  HCT 39.4 36.3*  MCV 96.3 96.8  PLT 85* 85*    Cardiac Enzymes: No results for input(s): CKTOTAL, CKMB, CKMBINDEX, TROPONINI in the last 72 hours. BNP: Invalid input(s): POCBNP D-Dimer: No results for input(s): DDIMER in the last 72 hours. Hemoglobin A1C: Recent Labs    03/30/24 0434  HGBA1C 5.7*   Fasting Lipid Panel: No results for input(s): CHOL, HDL, LDLCALC, TRIG, CHOLHDL, LDLDIRECT in the last 72 hours. Thyroid  Function Tests: No results for input(s): TSH, T4TOTAL, T3FREE, THYROIDAB in the last 72 hours.  Invalid input(s): FREET3 Anemia Panel: No results for input(s): VITAMINB12, FOLATE, FERRITIN, TIBC, IRON, RETICCTPCT in the last 72 hours.  No results found.   Echo severely depressed left ventricular ejection fraction 25 to 30%  TELEMETRY: Atrial fibrillation rate of 75 nonspecific T2 changes:  ASSESSMENT AND PLAN:  Principal Problem:   Acute on chronic HFrEF (heart failure with reduced ejection fraction) (HCC) Active Problems:   Diabetes mellitus, type II (HCC)   CAD with history of CABG and stents (coronary artery disease)   Chronic hypotension   ESRD on dialysis (HCC)   Chronic atrial fibrillation with RVR (HCC)   Aneurysm of thoracic aorta   Acute on chronic congestive heart failure (HCC)   CHF exacerbation (HCC)    Plan  Patient doing somewhat better with atrial fibrillation rates improved on IV amiodarone  loading drip will consider cardioversion discontinue aspirin  consider discontinuing Plavix  as well Consider TEE cardioversion tomorrow transition from IV amiodarone  to p.o. Transition from subcu heparin  to IV heparin  to Eliquis 2.5 mg twice a day Continue nephrology input for renal insufficiency Continue diabetes management and control Referred to heart failure as an outpatient for management of severely depressed left ventricular function GDMT is limited because of renal sufficiency will consider hydralazine  and Imdur beta-blocker but consider with  nephrology input ACE ARB Arni and spironolactone     Cara JONETTA Lovelace, MD, 04/01/2024 2:17 PM

## 2024-04-01 NOTE — Plan of Care (Signed)
  Problem: Education: Goal: Ability to describe self-care measures that may prevent or decrease complications (Diabetes Survival Skills Education) will improve Outcome: Progressing   Problem: Coping: Goal: Ability to adjust to condition or change in health will improve Outcome: Progressing   Problem: Fluid Volume: Goal: Ability to maintain a balanced intake and output will improve Outcome: Progressing   Problem: Metabolic: Goal: Ability to maintain appropriate glucose levels will improve Outcome: Progressing   Problem: Nutritional: Goal: Maintenance of adequate nutrition will improve Outcome: Progressing   Problem: Skin Integrity: Goal: Risk for impaired skin integrity will decrease Outcome: Progressing   Problem: Tissue Perfusion: Goal: Adequacy of tissue perfusion will improve Outcome: Progressing   Problem: Activity: Goal: Capacity to carry out activities will improve Outcome: Progressing   Problem: Cardiac: Goal: Ability to achieve and maintain adequate cardiopulmonary perfusion will improve Outcome: Progressing   Problem: Clinical Measurements: Goal: Respiratory complications will improve Outcome: Progressing

## 2024-04-01 NOTE — Progress Notes (Signed)
 PHARMACY - ANTICOAGULATION CONSULT NOTE  Pharmacy Consult for heparin  infusion Indication: atrial fibrillation  Allergies  Allergen Reactions   Statins     Other Reaction(s): Unknown   Amoxicillin Diarrhea    Patient Measurements: Weight: 82.4 kg (181 lb 10.5 oz)  Vital Signs: Temp: 97.9 F (36.6 C) (11/09 0807) Temp Source: Oral (11/09 0807) BP: 88/62 (11/09 0807) Pulse Rate: 84 (11/09 0807)  Labs: Recent Labs    03/29/24 1552 03/29/24 2145 03/30/24 1708 03/31/24 0451 03/31/24 1634 04/01/24 0411  HGB 12.5*  --  12.9*  --  12.0*  --   HCT 38.6*  --  39.4  --  36.3*  --   PLT 85*  --  85*  --  85*  --   CREATININE 2.36*  --  1.44* 2.11*  --  2.84*  TROPONINIHS 52* 52*  --   --   --   --     Estimated Creatinine Clearance: 17.5 mL/min (A) (by C-G formula based on SCr of 2.84 mg/dL (H)).   Medical History: Past Medical History:  Diagnosis Date   Anginal pain    Arthritis    Ascending aortic aneurysm 07/18/2015   a.) TTE 07/18/2015: severe dilation of ascending aorta measuring 7.0 cm. b.) TTE 01/10/2018: aortic root 3.8 cm; ascending aorta 6.8 cm; refused surgical intervention/repair.   Atrial fibrillation (HCC)    a.) CHA2DS2-VASc = 6 (age x 2, HFrEF, HTN, previous MI, T2DM). b.) rate/rhythm maintained on amiodarone  + metoprolol  succinate; chronic antiplatelet therapy using clopidogrel    Bradycardia    Coronary artery disease    ESRD (end stage renal disease) (HCC)    GERD (gastroesophageal reflux disease)    HFrEF (heart failure with reduced ejection fraction) (HCC)    a.) TTE 12/03/2013: mod dec LV function; EF 35-40%; severe inferolateral and inferior HK; LA dilated; G3DD; PASP 52 mmHg. b.) TTE 07/18/2015: mild LV dysfunction with mild LVH; EF 45%; mild BAE. c.) TTE 01/10/2018: mod LV dysfunction; EF 45%; diffuse HK   HLD (hyperlipidemia)    Hypertension    IDA (iron deficiency anemia)    Long term current use of antithrombotics/antiplatelets    a.)  clopidogrel    Myocardial infarction (HCC) 2000   Pneumonia    S/P CABG x 4 2001   a.) LIMA-LAD, SVG-D1, SVG-OM1, SVG-OM2, SVG-PDA   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   left frontal scalp, EDC   Squamous cell carcinoma of skin 05/05/2020   left temporal scalp, in situ, EDC 05/13/20   T2DM (type 2 diabetes mellitus) (HCC)    Valvular insufficiency 12/03/2013   a.) TTE 12/03/2013: EF 35-40%; mild AR/MR, b.) TTE 07/18/2015: EF 45%; mild AR/PR, mod TR, severe MR. c.) TTE 01/10/2018: mod AR/TR    Medications:  Scheduled:   Chlorhexidine  Gluconate Cloth  6 each Topical Q0600   clopidogrel   75 mg Oral Daily   feeding supplement  237 mL Oral BID BM   ferrous sulfate   325 mg Oral BID WC   furosemide   40 mg Intravenous BID   insulin  aspart  0-5 Units Subcutaneous QHS   insulin  aspart  0-6 Units Subcutaneous TID WC   levothyroxine   100 mcg Oral Q0600   potassium chloride  SA  20 mEq Oral Daily   rosuvastatin   40 mg Oral Daily   Infusions:   amiodarone  30 mg/hr (04/01/24 0755)   PRN: acetaminophen  **OR** acetaminophen , HYDROcodone -acetaminophen , hydrOXYzine, ondansetron  **OR** ondansetron  (ZOFRAN ) IV  Assessment: 89 y.o. male with PMH of CAD s/p CABG  and stents, ESRD on HD MWF, atrial fibrillation (not on [facility], previously on amiodarone ), ascending aortic aneurysm, HFrEF(EF 30-35% 12/2022), HTN, HLD,DM, being admitted for CHF exacerbation   Goal of Therapy:  anti-Xa level 0.3-0.7 units/ml Monitor platelets by anticoagulation protocol: Yes   Plan:  ---will give 4100 units bolus x 1 ---Start heparin  infusion at 1200 units/hr ---Check anti-Xa level in 8 hours after initiation ---Continue to monitor H&H and platelets  Adriana JONETTA Bolster 04/01/2024,2:21 PM

## 2024-04-01 NOTE — Progress Notes (Addendum)
 [facility]  ROUNDING NOTE   Subjective:   Matthew [name]  is a 89 y.o. male with end stage renal disease on hemodialysis, hypotension, hyperlipidemia, coronary artery disease status post CABG, atrial fibrillation, ascending aortic aneurysm, congestive hear failure, and diabetes mellitus type II.  Patient presented to the emergency department complaining of general weakness and has been admitted under observation for CHF exacerbation (HCC) [I50.9] Acute on chronic congestive heart failure, unspecified heart failure type [facility]) [I50.9]  Patient is known to our practice and receives outpatient dialysis treatments at [facility] on a MWF schedule, supervised by Dr. Marcelino.    Patient seen laying in bed Daughter at bedside Denies shortness of breath, 3L  No lower extremity edema  Objective:  Vital signs in last 24 hours:  Temp:  [97.5 F (36.4 C)-98.1 F (36.7 C)] 97.9 F (36.6 C) (11/09 0807) Pulse Rate:  [68-118] 84 (11/09 0807) Resp:  [14-27] 16 (11/09 0807) BP: (88-110)/(59-80) 88/62 (11/09 0807) SpO2:  [92 %-100 %] 100 % (11/09 0807) Weight:  [79 kg-82.4 kg] 82.4 kg (11/09 0500)  Weight change: -3.1 kg Filed Weights   03/31/24 1622 03/31/24 1838 04/01/24 0500  Weight: 80.5 kg 79 kg 82.4 kg    Intake/Output: I/O last 3 completed shifts: In: 694.9 [P.O.:430; I.V.:264.9] Out: 2150 [Urine:650; Other:1500]   Intake/Output this shift:  No intake/output data recorded.  Physical Exam: General: NAD  Head: Normocephalic, atraumatic. Moist oral mucosal membranes  Eyes: Anicteric  Lungs:  Clear, normal effort  Heart: Irregular rate and rhythm  Abdomen:  Soft, nontender  Extremities: No peripheral edema.  Neurologic: Awake, alert, conversant  Skin: Warm,dry, no rash  Access: Right IJ PermCath    Basic Metabolic Panel: Recent Labs  Lab 03/29/24 1552 03/30/24 1708 03/31/24 0451 04/01/24 0411  NA 133* 135 132* 133*  K 4.2 5.0 4.7 5.4*  CL 95* 97*  100 96*  CO2 25 24 22  21*  GLUCOSE 121* 118* 137* 167*  BUN 44* 20 31* 49*  CREATININE 2.36* 1.44* 2.11* 2.84*  CALCIUM  9.1 9.0 8.8* 9.0  MG  --   --  2.2  --   PHOS  --  2.3*  --   --     Liver Function Tests: Recent Labs  Lab 03/30/24 1708  ALBUMIN  3.5   No results for input(s): LIPASE, AMYLASE in the last 168 hours. No results for input(s): AMMONIA in the last 168 hours.  CBC: Recent Labs  Lab 03/29/24 1552 03/30/24 1708 03/31/24 1634  WBC 5.9 5.4 5.7  HGB 12.5* 12.9* 12.0*  HCT 38.6* 39.4 36.3*  MCV 97.7 96.3 96.8  PLT 85* 85* 85*    Cardiac Enzymes: No results for input(s): CKTOTAL, CKMB, CKMBINDEX, TROPONINI in the last 168 hours.  BNP: Invalid input(s): POCBNP  CBG: Recent Labs  Lab 03/31/24 0745 03/31/24 1212 03/31/24 1913 03/31/24 2139 04/01/24 0750  GLUCAP 122* 179* 188* 180* 151*    Microbiology: Results for orders placed or performed during the hospital encounter of 03/29/24  MRSA Next Gen by PCR, Nasal     Status: None   Collection Time: 03/30/24 10:53 PM   Specimen: Nasal Mucosa; Nasal Swab  Result Value Ref Range Status   MRSA by PCR Next Gen NOT DETECTED NOT DETECTED Final    Comment: (NOTE) The GeneXpert MRSA Assay (FDA approved for NASAL specimens only), is one component of a comprehensive MRSA colonization surveillance program. It is not intended to diagnose MRSA infection nor to guide or  monitor treatment for MRSA infections. Test performance is not FDA approved in patients less than 70 years old. Performed at [facility], [address] [place], [place] [address]     Coagulation Studies: No results for input(s): LABPROT, INR in the last 72 hours.  Urinalysis: No results for input(s): COLORURINE, LABSPEC, PHURINE, GLUCOSEU, HGBUR, BILIRUBINUR, KETONESUR, PROTEINUR, UROBILINOGEN, NITRITE, LEUKOCYTESUR in the last 72 hours.  Invalid input(s): APPERANCEUR     Imaging: ECHOCARDIOGRAM COMPLETE Result Date: 03/31/2024    ECHOCARDIOGRAM REPORT   Patient Name:   Matthew [name] Date of Exam: 03/30/2024 Medical Rec #:  [id number]     Height:       70.1 in Accession #:    [id number]    Weight:       191.1 lb Date of Birth:  [birth year]      BSA:          2.049 m Patient Age:    89 years      BP:           97/64 mmHg Patient Gender: M             HR:           119 bpm. Exam Location:  [facility] Procedure: 2D Echo, Cardiac Doppler and Color Doppler (Both Spectral and Color            Flow Doppler were utilized during procedure). Indications:     CHF-Acute Systolic I50.21  History:         Patient has no prior history of Echocardiogram examinations.                  CHF; Arrythmias:Atrial Fibrillation.  Sonographer:     Ashley McNeely-Sloane Referring Phys:  Delayne Solian MD Diagnosing Phys: Cara JONETTA Lovelace MD IMPRESSIONS  1. Prosthetic AoV.  2. Left ventricular ejection fraction, by estimation, is 25 to 30%. The left ventricle has severely decreased function. The left ventricle demonstrates global hypokinesis. The left ventricular internal cavity size was moderately dilated. Left ventricular diastolic function could not be evaluated.  3. Right ventricular systolic function is mildly reduced. The right ventricular size is moderately enlarged. Moderately increased right ventricular wall thickness.  4. Left atrial size was moderately dilated.  5. Right atrial size was mildly dilated.  6. The mitral valve is normal in structure. Moderate to severe mitral valve regurgitation.  7. The aortic valve is calcified. Aortic valve regurgitation is mild to moderate. Aortic valve sclerosis/calcification is present, without any evidence of aortic stenosis. Echo findings are consistent with normal structure and function of the aortic valve prosthesis. FINDINGS  Left Ventricle: Left ventricular ejection fraction, by estimation, is 25 to 30%. The left ventricle has severely decreased function. The  left ventricle demonstrates global hypokinesis. Strain was performed and the global longitudinal strain is indeterminate. The left ventricular internal cavity size was moderately dilated. There is borderline left ventricular hypertrophy. Left ventricular diastolic function could not be evaluated. Right Ventricle: The right ventricular size is moderately enlarged. Moderately increased right ventricular wall thickness. Right ventricular systolic function is mildly reduced. Left Atrium: Left atrial size was moderately dilated. Right Atrium: Right atrial size was mildly dilated. Pericardium: There is no evidence of pericardial effusion. Mitral Valve: The mitral valve is normal in structure. Moderate to severe mitral valve regurgitation. MV peak gradient, 31.2 mmHg. The mean mitral valve gradient is 25.0 mmHg. Tricuspid Valve: The tricuspid valve is grossly normal. Tricuspid valve regurgitation is mild. Aortic Valve: The  aortic valve is calcified. Aortic valve regurgitation is mild to moderate. Aortic valve sclerosis/calcification is present, without any evidence of aortic stenosis. Aortic valve mean gradient measures 3.0 mmHg. Aortic valve peak gradient measures 5.5 mmHg. Aortic valve area, by VTI measures 2.94 cm. Echo findings are consistent with normal structure and function of the aortic valve prosthesis. Pulmonic Valve: The pulmonic valve was normal in structure. Pulmonic valve regurgitation is not visualized. Aorta: The ascending aorta was not well visualized. IAS/Shunts: No atrial level shunt detected by color flow Doppler. Additional Comments: Prosthetic AoV. 3D was performed not requiring image post processing on an independent workstation and was indeterminate.  LEFT VENTRICLE PLAX 2D LVIDd:         5.21 cm      Diastology LVIDs:         4.03 cm      LV e' medial:    5.92 cm/s LV PW:         1.10 cm      LV E/e' medial:  19.1 LV IVS:        1.13 cm      LV e' lateral:   11.10 cm/s LVOT diam:     2.20 cm       LV E/e' lateral: 10.2 LV SV:         54 LV SV Index:   27 LVOT Area:     3.80 cm  LV Volumes (MOD) LV vol d, MOD A2C: 150.0 ml LV vol d, MOD A4C: 141.0 ml LV vol s, MOD A2C: 127.0 ml LV vol s, MOD A4C: 108.0 ml LV SV MOD A2C:     23.0 ml LV SV MOD A4C:     141.0 ml LV SV MOD BP:      32.3 ml RIGHT VENTRICLE            IVC RV Basal diam:  6.23 cm    IVC diam: 2.45 cm RV Mid diam:    4.42 cm RV S prime:     5.98 cm/s TAPSE (M-mode): 0.7 cm LEFT ATRIUM           Index        RIGHT ATRIUM           Index LA diam:      5.00 cm 2.44 cm/m   RA Area:     28.70 cm LA Vol (A2C): 71.2 ml 34.75 ml/m  RA Volume:   104.00 ml 50.75 ml/m LA Vol (A4C): 44.4 ml 21.67 ml/m  AORTIC VALVE                    PULMONIC VALVE AV Area (Vmax):    2.58 cm     PV Vmax:        0.71 m/s AV Area (Vmean):   2.64 cm     PV Vmean:       44.700 cm/s AV Area (VTI):     2.94 cm     PV VTI:         0.109 m AV Vmax:           117.00 cm/s  PV Peak grad:   2.0 mmHg AV Vmean:          85.800 cm/s  PV Mean grad:   1.0 mmHg AV VTI:            0.185 m      RVOT Peak grad: 1 mmHg AV Peak Grad:  5.5 mmHg AV Mean Grad:      3.0 mmHg LVOT Vmax:         79.30 cm/s LVOT Vmean:        59.500 cm/s LVOT VTI:          0.143 m LVOT/AV VTI ratio: 0.77  AORTA Ao Root diam: 3.80 cm Ao Asc diam:  5.90 cm MITRAL VALVE MV Area (PHT): 3.63 cm     SHUNTS MV Area VTI:   0.73 cm     Systemic VTI:  0.14 m MV Peak grad:  31.2 mmHg    Systemic Diam: 2.20 cm MV Mean grad:  25.0 mmHg    Pulmonic VTI:  0.061 m MV Vmax:       2.80 m/s MV Vmean:      202.2 cm/s MV Decel Time: 209 msec MV E velocity: 113.00 cm/s Cara JONETTA Lovelace MD Electronically signed by Cara JONETTA Lovelace MD Signature Date/Time: 03/31/2024/1:30:27 PM    Final      Medications:    amiodarone  30 mg/hr (04/01/24 0755)    aspirin  EC  81 mg Oral Daily   Chlorhexidine  Gluconate Cloth  6 each Topical Q0600   clopidogrel   75 mg Oral Daily   feeding supplement  237 mL Oral BID BM   ferrous sulfate   325  mg Oral BID WC   furosemide   40 mg Intravenous BID   heparin   5,000 Units Subcutaneous Q8H   insulin  aspart  0-5 Units Subcutaneous QHS   insulin  aspart  0-6 Units Subcutaneous TID WC   levothyroxine   100 mcg Oral Q0600   potassium chloride  SA  20 mEq Oral Daily   rosuvastatin   40 mg Oral Daily   acetaminophen  **OR** acetaminophen , HYDROcodone -acetaminophen , hydrOXYzine, ondansetron  **OR** ondansetron  (ZOFRAN ) IV  Assessment/ Plan:  Mr. BLAYDEN CONWELL is a 89 y.o.  male with end stage renal disease on hemodialysis, hypotension, hyperlipidemia, coronary artery disease status post CABG, atrial fibrillation, ascending aortic aneurysm, congestive hear failure, and diabetes mellitus type II.Patient presents with weakness and his admitted under observation for CHF exacerbation (HCC) [I50.9] Acute on chronic congestive heart failure, unspecified heart failure type (HCC) [I50.9]    [facility] [address] MWF L AVF   Acute on chronic heart failure  with bilateral pleural effusions seen on chest xray and CT chest. Mild shortness of breath. BNP 746. Last ECHO on 01/11/23 showed EF 35-40%, updated ECHO this admission shows EF 25-30 %. IV furosemide  40mg  twice daily.   2.  End-stage renal disease on hemodialysis.  Patient did receive dialysis later yesterday afternoon, UF 1.5L achieved. Next treatment scheduled for Monday  3.  Atrial fibrillation, chronic.  Mild heart rate elevation.  Patient prescribed amiodarone  outpatient however has not started medication.  Placed on Amio drip during this admissoin  4. Anemia of chronic kidney disease Lab Results  Component Value Date   HGB 12.0 (L) 03/31/2024    No need for ESA's at this time    LOS: 0 Hughey Rittenberry 11/9/202510:49 AM

## 2024-04-01 NOTE — Plan of Care (Signed)
  Problem: Education: Goal: Ability to describe self-care measures that may prevent or decrease complications (Diabetes Survival Skills Education) will improve Outcome: Progressing   Problem: Coping: Goal: Ability to adjust to condition or change in health will improve Outcome: Progressing   Problem: Fluid Volume: Goal: Ability to maintain a balanced intake and output will improve Outcome: Progressing   Problem: Health Behavior/Discharge Planning: Goal: Ability to identify and utilize available resources and services will improve Outcome: Progressing   Problem: Tissue Perfusion: Goal: Adequacy of tissue perfusion will improve Outcome: Progressing   Problem: Education: Goal: Ability to verbalize understanding of medication therapies will improve Outcome: Progressing   Problem: Health Behavior/Discharge Planning: Goal: Ability to manage health-related needs will improve Outcome: Not Progressing   Problem: Metabolic: Goal: Ability to maintain appropriate glucose levels will improve Outcome: Not Progressing   Problem: Nutritional: Goal: Maintenance of adequate nutrition will improve Outcome: Not Progressing   Problem: Skin Integrity: Goal: Risk for impaired skin integrity will decrease Outcome: Not Progressing   Problem: Activity: Goal: Capacity to carry out activities will improve Outcome: Not Progressing   Problem: Clinical Measurements: Goal: Respiratory complications will improve Outcome: Not Progressing

## 2024-04-01 NOTE — Progress Notes (Signed)
 PROGRESS NOTE    Matthew [name]  FMW:[id number] DOB: [birth year] DOA: 03/29/2024 PCP: Albina GORMAN Dine, MD  Outpatient Specialists: cardiology    Brief Narrative:   From admission h and p  Matthew [name] is a 89 y.o. male with medical history significant for CAD s/p CABG and stents, ESRD on HD MWF, atrial fibrillation(not on [facility], previously on amiodarone ), ascending aortic aneurysm, HFrEF(EF 30-35% 12/2022), HTN, HLD,DM, being admitted for CHF exacerbation after presenting with a history of  generalized weakness, shortness of breath.  He denies chest pain or leg swelling.  He recently saw his cardiologist, Dr. Fernand who ordered an echocardiogram (scheduled for 04/03/24) and  placed him back on amiodarone  but it has not yet been filled (note not found in Care Everywhere) In the ED, afebrile, heart rate 99-108.  O2 sats 98% on room air and normotensive.  11/8: Remained in A-fib with mild RVR, going for extra session of dialysis today. Cardiology is on board-planning to start him on amiodarone .  11/9: Remained in A-fib with controlled heart rate on IV amiodarone .  Cardiology started on heparin  infusion and planning DCCV tomorrow.  Had an extra session of dialysis yesterday.  Assessment & Plan:   Principal Problem:   Acute on chronic HFrEF (heart failure with reduced ejection fraction) (HCC) Active Problems:   Chronic atrial fibrillation with RVR (HCC)   CAD with history of CABG and stents (coronary artery disease)   Chronic hypotension   ESRD on dialysis (HCC)   Diabetes mellitus, type II (HCC)   Aneurysm of thoracic aorta   Acute on chronic congestive heart failure (HCC)   CHF exacerbation (HCC)  Acute on chronic HFrEF (heart failure with reduced ejection fraction) (HCC) Bilateral pleural effusion Patient with shortness of breath appears mildly fluid overloaded, has elevated BNP of 746 Extra session of dialysis yesterday, feeling improved Will continue Lasix  40 mg twice daily as  patient still produces urine Daily weights with intake and output monitoring Echocardiogram with EF of 25 to 30%, dilated cardiomyopathy, moderate to severe MR, normal structure and function of prosthetic AV. Cardiology has been consulted.   Chronic atrial fibrillation with RVR (HCC) Heart rate improved after starting IV amiodarone -will be switched to p.o. on discharge Started on heparin  infusion-plan is to start on Eliquis on discharge. -Cardiology is planning DCCV tomorrow with TEE   CAD with history of CABG and stents (coronary artery disease) Elevated troponin (flat trend 52-> 52) No complaints of chest pain, EKG nonacute and troponins slightly elevated but flat 52-52 likely demand ischemia  Continue  rosuvastatin  as well as nitroglycerin  sublingual as needed chest pain -Aspirin  was discontinued, cardiology likely will discontinue Plavix  also after starting him on Eliquis   ESRD on dialysis [facility]) Nephrology consulted for continuation of dialysis,  - Going for an extra session of dialysis today  Diabetes mellitus, type II (HCC) Sliding scale insulin  coverage   Aneurysm of thoracic aorta 6.8 cm.  Recommendation for semiannual imaging  DVT prophylaxis: heparin  Code Status: DNR Family Communication: Discussed with daughter at bedside  Level of care: Progressive Status is: Inpatient  Consultants:  Nephrology Cardiology  Procedures: hemodialysis  Antimicrobials:  none   Subjective: Patient was feeling tired when seen today.  Daughter concern of that he is looking more lethargic.  No baseline oxygen use  Objective: Vitals:   04/01/24 0200 04/01/24 0400 04/01/24 0500 04/01/24 0807  BP: 108/65 (!) 100/59  (!) 88/62  Pulse: 90 89  84  Resp:  19  16  Temp:  97.8 F (36.6 C)  97.9 F (36.6 C)  TempSrc:  Oral  Oral  SpO2:  100%  100%  Weight:   82.4 kg     Intake/Output Summary (Last 24 hours) at 04/01/2024 1539 Last data filed at 04/01/2024 1300 Gross per 24 hour   Intake 604.89 ml  Output 1800 ml  Net -1195.11 ml   Filed Weights   03/31/24 1622 03/31/24 1838 04/01/24 0500  Weight: 80.5 kg 79 kg 82.4 kg    Examination:  General.  Frail elderly man, in no acute distress. Pulmonary.  Lungs clear bilaterally, normal respiratory effort. CV.  Irregularly irregular Abdomen.  Soft, nontender, nondistended, BS positive. CNS.  Alert and oriented .  No focal neurologic deficit. Extremities.  No edema, no cyanosis, pulses intact and symmetrical.  Data Reviewed: I have personally reviewed following labs and imaging studies  CBC: Recent Labs  Lab 03/29/24 1552 03/30/24 1708 03/31/24 1634  WBC 5.9 5.4 5.7  HGB 12.5* 12.9* 12.0*  HCT 38.6* 39.4 36.3*  MCV 97.7 96.3 96.8  PLT 85* 85* 85*   Basic Metabolic Panel: Recent Labs  Lab 03/29/24 1552 03/30/24 1708 03/31/24 0451 04/01/24 0411  NA 133* 135 132* 133*  K 4.2 5.0 4.7 5.4*  CL 95* 97* 100 96*  CO2 25 24 22  21*  GLUCOSE 121* 118* 137* 167*  BUN 44* 20 31* 49*  CREATININE 2.36* 1.44* 2.11* 2.84*  CALCIUM  9.1 9.0 8.8* 9.0  MG  --   --  2.2  --   PHOS  --  2.3*  --   --    GFR: Estimated Creatinine Clearance: 17.5 mL/min (A) (by C-G formula based on SCr of 2.84 mg/dL (H)). Liver Function Tests: Recent Labs  Lab 03/30/24 1708  ALBUMIN  3.5   No results for input(s): LIPASE, AMYLASE in the last 168 hours. No results for input(s): AMMONIA in the last 168 hours. Coagulation Profile: No results for input(s): INR, PROTIME in the last 168 hours. Cardiac Enzymes: No results for input(s): CKTOTAL, CKMB, CKMBINDEX, TROPONINI in the last 168 hours. BNP (last 3 results) No results for input(s): PROBNP in the last 8760 hours. HbA1C: Recent Labs    03/30/24 0434  HGBA1C 5.7*   CBG: Recent Labs  Lab 03/31/24 1212 03/31/24 1913 03/31/24 2139 04/01/24 0750 04/01/24 1145  GLUCAP 179* 188* 180* 151* 177*   Lipid Profile: No results for input(s): CHOL, HDL,  LDLCALC, TRIG, CHOLHDL, LDLDIRECT in the last 72 hours. Thyroid  Function Tests: No results for input(s): TSH, T4TOTAL, FREET4, T3FREE, THYROIDAB in the last 72 hours. Anemia Panel: No results for input(s): VITAMINB12, FOLATE, FERRITIN, TIBC, IRON, RETICCTPCT in the last 72 hours. Urine analysis:    Component Value Date/Time   COLORURINE YELLOW (A) 05/24/2019 0021   APPEARANCEUR CLEAR (A) 05/24/2019 0021   LABSPEC 1.012 05/24/2019 0021   PHURINE 6.0 05/24/2019 0021   GLUCOSEU NEGATIVE 05/24/2019 0021   HGBUR NEGATIVE 05/24/2019 0021   BILIRUBINUR Negative 10/15/2022 1202   KETONESUR NEGATIVE 05/24/2019 0021   PROTEINUR Positive (A) 10/15/2022 1202   PROTEINUR 30 (A) 05/24/2019 0021   UROBILINOGEN 0.2 10/15/2022 1202   NITRITE Negative 10/15/2022 1202   NITRITE NEGATIVE 05/24/2019 0021   LEUKOCYTESUR Large (3+) (A) 10/15/2022 1202   LEUKOCYTESUR NEGATIVE 05/24/2019 0021   Sepsis Labs: @LABRCNTIP (procalcitonin:4,lacticidven:4)  ) Recent Results (from the past 240 hours)  MRSA Next Gen by PCR, Nasal     Status: None   Collection Time: 03/30/24 10:53  PM   Specimen: Nasal Mucosa; Nasal Swab  Result Value Ref Range Status   MRSA by PCR Next Gen NOT DETECTED NOT DETECTED Final    Comment: (NOTE) The GeneXpert MRSA Assay (FDA approved for NASAL specimens only), is one component of a comprehensive MRSA colonization surveillance program. It is not intended to diagnose MRSA infection nor to guide or monitor treatment for MRSA infections. Test performance is not FDA approved in patients less than 67 years old. Performed at [facility], [address], [place], [place] [address]     Radiology Studies: No results found.  Scheduled Meds:  Chlorhexidine  Gluconate Cloth  6 each Topical Q0600   clopidogrel   75 mg Oral Daily   feeding supplement  237 mL Oral BID BM   ferrous sulfate   325 mg Oral BID WC   furosemide   40 mg Intravenous BID    heparin   4,100 Units Intravenous Once   insulin  aspart  0-5 Units Subcutaneous QHS   insulin  aspart  0-6 Units Subcutaneous TID WC   levothyroxine   100 mcg Oral Q0600   rosuvastatin   40 mg Oral Daily   Continuous Infusions:  amiodarone  30 mg/hr (04/01/24 0755)   heparin        LOS: 0 days   This record has been created using Conservation officer, historic buildings. Errors have been sought and corrected,but may not always be located. Such creation errors do not reflect on the standard of care.   Amaryllis Dare, MD [facility]   If 7PM-7AM, please contact night-coverage [email or web address] Password [facility] 04/01/2024, 3:39 PM

## 2024-04-01 NOTE — Progress Notes (Signed)
 Attempted to have pt sign consent for TEE and Cardioversion tomorrow with daughter in the room. Both pt and daughter stated that they have not talked with provider about procedure and declined to sign consent until they are able to talk with provider. Zein Helbing S Daijanae Rafalski

## 2024-04-02 ENCOUNTER — Other Ambulatory Visit: Payer: Self-pay

## 2024-04-02 ENCOUNTER — Telehealth (HOSPITAL_COMMUNITY): Payer: Self-pay | Admitting: Pharmacy Technician

## 2024-04-02 ENCOUNTER — Other Ambulatory Visit (HOSPITAL_COMMUNITY): Payer: Self-pay

## 2024-04-02 ENCOUNTER — Inpatient Hospital Stay: Admitting: Anesthesiology

## 2024-04-02 ENCOUNTER — Encounter
Admission: EM | Disposition: A | Discharge status: Home Health | Payer: Self-pay | Source: Home / Self Care | Attending: Internal Medicine

## 2024-04-02 ENCOUNTER — Inpatient Hospital Stay
Admit: 2024-04-02 | Discharge: 2024-04-02 | Disposition: A | Attending: Cardiovascular Disease | Admitting: Cardiovascular Disease

## 2024-04-02 DIAGNOSIS — I25810 Atherosclerosis of coronary artery bypass graft(s) without angina pectoris: Secondary | ICD-10-CM | POA: Diagnosis not present

## 2024-04-02 DIAGNOSIS — I429 Cardiomyopathy, unspecified: Secondary | ICD-10-CM

## 2024-04-02 DIAGNOSIS — I5023 Acute on chronic systolic (congestive) heart failure: Secondary | ICD-10-CM

## 2024-04-02 DIAGNOSIS — E782 Mixed hyperlipidemia: Secondary | ICD-10-CM

## 2024-04-02 DIAGNOSIS — I480 Paroxysmal atrial fibrillation: Secondary | ICD-10-CM

## 2024-04-02 DIAGNOSIS — I482 Chronic atrial fibrillation, unspecified: Secondary | ICD-10-CM | POA: Diagnosis not present

## 2024-04-02 DIAGNOSIS — I509 Heart failure, unspecified: Secondary | ICD-10-CM | POA: Diagnosis not present

## 2024-04-02 HISTORY — PX: TEE WITHOUT CARDIOVERSION: SHX5443

## 2024-04-02 HISTORY — PX: CARDIOVERSION: SHX1299

## 2024-04-02 LAB — CBC
HCT: 37.5 % — ABNORMAL LOW (ref 39.0–52.0)
Hemoglobin: 12.5 g/dL — ABNORMAL LOW (ref 13.0–17.0)
MCH: 32 pg (ref 26.0–34.0)
MCHC: 33.3 g/dL (ref 30.0–36.0)
MCV: 95.9 fL (ref 80.0–100.0)
Platelets: 86 K/uL — ABNORMAL LOW (ref 150–400)
RBC: 3.91 MIL/uL — ABNORMAL LOW (ref 4.22–5.81)
RDW: 14.8 % (ref 11.5–15.5)
WBC: 6.3 K/uL (ref 4.0–10.5)
nRBC: 0 % (ref 0.0–0.2)

## 2024-04-02 LAB — GLUCOSE, CAPILLARY
Glucose-Capillary: 82 mg/dL (ref 70–99)
Glucose-Capillary: 163 mg/dL — ABNORMAL HIGH (ref 70–99)

## 2024-04-02 LAB — BASIC METABOLIC PANEL WITH GFR
Anion gap: 14 (ref 5–15)
BUN: 68 mg/dL — ABNORMAL HIGH (ref 8–23)
CO2: 22 mmol/L (ref 22–32)
Calcium: 9 mg/dL (ref 8.9–10.3)
Chloride: 93 mmol/L — ABNORMAL LOW (ref 98–111)
Creatinine, Ser: 3.86 mg/dL — ABNORMAL HIGH (ref 0.61–1.24)
GFR, Estimated: 14 mL/min — ABNORMAL LOW (ref >60)
Glucose, Bld: 159 mg/dL — ABNORMAL HIGH (ref 70–99)
Potassium: 5.5 mmol/L — ABNORMAL HIGH (ref 3.5–5.1)
Sodium: 129 mmol/L — ABNORMAL LOW (ref 135–145)

## 2024-04-02 LAB — PROTIME-INR
INR: 1.1 (ref 0.8–1.2)
Prothrombin Time: 15.2 s (ref 11.4–15.2)

## 2024-04-02 LAB — HEPARIN LEVEL (UNFRACTIONATED): Heparin Unfractionated: >1.1 [IU]/mL — ABNORMAL HIGH (ref 0.30–0.70)

## 2024-04-02 LAB — ECHO TEE

## 2024-04-02 SURGERY — ECHOCARDIOGRAM, TRANSESOPHAGEAL
Anesthesia: Monitor Anesthesia Care

## 2024-04-02 SURGERY — ECHOCARDIOGRAM, TRANSESOPHAGEAL
Anesthesia: General

## 2024-04-02 MED ORDER — SODIUM CHLORIDE 0.9 % IV SOLN: INTRAVENOUS | Status: DC

## 2024-04-02 MED ORDER — AMIODARONE HCL 200 MG PO TABS
400.0000 mg | ORAL_TABLET | Freq: Two times a day (BID) | ORAL | Status: DC
  Administered 2024-04-02 – 2024-04-03 (×3): 400 mg via ORAL
  Filled 2024-04-02 (×3): qty 2

## 2024-04-02 MED ORDER — HEPARIN (PORCINE) 25000 UT/250ML-% IV SOLN
900.0000 [IU]/h | INTRAVENOUS | Status: DC
  Administered 2024-04-02: 900 [IU]/h via INTRAVENOUS

## 2024-04-02 MED ORDER — MIDODRINE HCL 5 MG PO TABS
5.0000 mg | ORAL_TABLET | Freq: Once | ORAL | Status: AC
  Administered 2024-04-02: 5 mg via ORAL
  Filled 2024-04-02 (×2): qty 1

## 2024-04-02 MED ORDER — APIXABAN 2.5 MG PO TABS
2.5000 mg | ORAL_TABLET | Freq: Two times a day (BID) | ORAL | Status: DC
  Administered 2024-04-02 – 2024-04-03 (×3): 2.5 mg via ORAL
  Filled 2024-04-02 (×3): qty 1

## 2024-04-02 MED ORDER — PHENYLEPHRINE 80 MCG/ML (10ML) SYRINGE FOR IV PUSH (FOR BLOOD PRESSURE SUPPORT): PREFILLED_SYRINGE | INTRAVENOUS | Status: DC | PRN

## 2024-04-02 MED ORDER — HEPARIN (PORCINE) 25000 UT/250ML-% IV SOLN: 900.0000 [IU]/h | INTRAVENOUS | Status: DC

## 2024-04-02 MED ORDER — PHENYLEPHRINE HCL (PRESSORS) 10 MG/ML IV SOLN
INTRAVENOUS | Status: DC | PRN
  Administered 2024-04-02: 80 ug via INTRAVENOUS
  Administered 2024-04-02 (×2): 160 ug via INTRAVENOUS
  Administered 2024-04-02: 80 ug via INTRAVENOUS
  Administered 2024-04-02 (×2): 160 ug via INTRAVENOUS

## 2024-04-02 MED ORDER — PROPOFOL 10 MG/ML IV BOLUS
INTRAVENOUS | Status: DC | PRN
  Administered 2024-04-02 (×5): 10 mg via INTRAVENOUS

## 2024-04-02 MED ORDER — HEPARIN SODIUM (PORCINE) 1000 UNIT/ML IJ SOLN
INTRAMUSCULAR | Status: AC
  Filled 2024-04-02: qty 5

## 2024-04-02 MED ORDER — BUTAMBEN-TETRACAINE-BENZOCAINE 2-2-14 % EX AERO
INHALATION_SPRAY | CUTANEOUS | Status: AC
  Filled 2024-04-02: qty 5

## 2024-04-02 MED ORDER — LIDOCAINE VISCOUS HCL 2 % MT SOLN
OROMUCOSAL | Status: AC
  Filled 2024-04-02: qty 15

## 2024-04-02 MED ORDER — AMIODARONE HCL 200 MG PO TABS
200.0000 mg | ORAL_TABLET | Freq: Every day | ORAL | Status: DC
  Filled 2024-04-02: qty 1

## 2024-04-02 NOTE — Transfer of Care (Signed)
 Immediate Anesthesia Transfer of Care Note  Patient: Matthew [name]  Procedure(s) Performed: ECHOCARDIOGRAM, TRANSESOPHAGEAL CARDIOVERSION  Patient Location: PACU  Anesthesia Type:General  Level of Consciousness: sedated  Airway & Oxygen Therapy: Patient Spontanous Breathing  Post-op Assessment: Report given to RN and Post -op Vital signs reviewed and stable  Post vital signs: Reviewed and stable  Last Vitals:  Vitals Value Taken Time  BP 83/60 04/02/24 14:51  Temp    Pulse 70 04/02/24 14:52  Resp 11 04/02/24 14:52  SpO2 88 % 04/02/24 14:52  Vitals shown include unfiled device data.  Last Pain:  Vitals:   04/02/24 1248  TempSrc: Temporal  PainSc: 0-No pain         Complications: No notable events documented.

## 2024-04-02 NOTE — Procedures (Signed)
 Electrical Cardioversion Procedure Note  Procedure: Electrical Cardioversion Indications:  Atrial Fibrillation  Procedure Details Consent: Risks of procedure as well as the alternatives and risks of each were explained to the (patient/caregiver).  Consent for procedure obtained. Time Out Performed: Verified patient identification, verified procedure, site/side was marked, verified correct patient position, special equipment/implants available, medications/allergies/relevent history reviewed, required imaging and test results available.   Patient placed on cardiac monitor, pulse oximetry, supplemental oxygen as necessary.  Sedation given: propofol  50 mg, given by CRNA Pacer pads placed anterior and posterior chest.  TEE was performed prior to DCCV. See separate note  Cardioverted 2 time(s).  Cardioverted at 200J, 250J - unsuccessful  Evaluation Findings: Post procedure EKG shows: Atrial Fibrillation Complications: None Patient did tolerate procedure well.   DEARL LEAVEN MD [facility] 2:47 PM 04/02/24

## 2024-04-02 NOTE — Plan of Care (Signed)

## 2024-04-02 NOTE — Anesthesia Preprocedure Evaluation (Addendum)
 Anesthesia Evaluation  Patient identified by MRN, date of birth, ID band Patient awake    Reviewed: Allergy & Precautions, NPO status , Patient's Chart, lab work & pertinent test results  Airway Mallampati: II  TM Distance: >3 FB     Dental no notable dental hx.    Pulmonary shortness of breath and with exertion, Patient abstained from smoking., former smoker    + decreased breath sounds      Cardiovascular Exercise Tolerance: Poor hypertension, Pt. on medications + angina  + CAD, + Past MI, + CABG and +CHF   Rhythm:Irregular Rate:Abnormal     Neuro/Psych negative neurological ROS     GI/Hepatic Neg liver ROS,GERD  Medicated,,  Endo/Other  diabetesHypothyroidism    Renal/GU   negative genitourinary   Musculoskeletal  (+) Arthritis ,    Abdominal   Peds negative pediatric ROS (+)  Hematology  (+) Blood dyscrasia, anemia   Anesthesia Other Findings   Reproductive/Obstetrics                              Anesthesia Physical Anesthesia Plan  ASA: 4  Anesthesia Plan: General   Post-op Pain Management:    Induction: Intravenous  PONV Risk Score and Plan:   Airway Management Planned: Natural Airway and Nasal Cannula  Additional Equipment:   Intra-op Plan:   Post-operative Plan:   Informed Consent: I have reviewed the patients History and Physical, chart, labs and discussed the procedure including the risks, benefits and alternatives for the proposed anesthesia with the patient or authorized representative who has indicated his/her understanding and acceptance.     Dental Advisory Given  Plan Discussed with: CRNA  Anesthesia Plan Comments: (Patient consented for risks of anesthesia including but not limited to:  - adverse reactions to medications - risk of airway placement if required - damage to eyes, teeth, lips or other oral mucosa - nerve damage due to positioning  -  sore throat or hoarseness - Damage to heart, brain, nerves, lungs, other parts of body or loss of life  Patient voiced understanding and assent.)         Anesthesia Quick Evaluation

## 2024-04-02 NOTE — Progress Notes (Addendum)
 PROGRESS NOTE    Matthew [name]  FMW:[id number] DOB: 09-27-1930 DOA: 03/29/2024 PCP: Albina GORMAN Dine, MD  Outpatient Specialists: cardiology    Brief Narrative:   From admission h and p  Matthew [name] is a 89 y.o. male with medical history significant for CAD s/p CABG and stents, ESRD on HD MWF, atrial fibrillation(not on [facility], previously on amiodarone ), ascending aortic aneurysm, HFrEF(EF 30-35% 12/2022), HTN, HLD,DM, being admitted for CHF exacerbation after presenting with a history of  generalized weakness, shortness of breath.  He denies chest pain or leg swelling.  He recently saw his cardiologist, Dr. Fernand who ordered an echocardiogram (scheduled for 04/03/24) and  placed him back on amiodarone  but it has not yet been filled (note not found in Care Everywhere) In the ED, afebrile, heart rate 99-108.  O2 sats 98% on room air and normotensive.  11/8: Remained in A-fib with mild RVR, going for extra session of dialysis today. Cardiology is on board-planning to start him on amiodarone .  11/9: Remained in A-fib with controlled heart rate on IV amiodarone .  Cardiology started on heparin  infusion and planning DCCV tomorrow.  Had an extra session of dialysis yesterday.  11/10: Had HD today, failed DCCV attempts x 2.  Remained in A-fib.  Cardiology would like to observe for 1 more night and likely be discharged home with home health tomorrow.  Assessment & Plan:   Principal Problem:   Acute on chronic HFrEF (heart failure with reduced ejection fraction) (HCC) Active Problems:   Chronic atrial fibrillation with RVR (HCC)   CAD with history of CABG and stents (coronary artery disease)   Chronic hypotension   ESRD on dialysis (HCC)   Diabetes mellitus, type II (HCC)   Aneurysm of thoracic aorta   Acute on chronic congestive heart failure (HCC)   CHF exacerbation (HCC)  Acute on chronic HFrEF (heart failure with reduced ejection fraction) (HCC) Bilateral pleural effusion Patient  with shortness of breath appears mildly fluid overloaded, has elevated BNP of 746 Extra session of dialysis yesterday, feeling improved Will continue Lasix  40 mg twice daily as patient still produces urine Daily weights with intake and output monitoring Echocardiogram with EF of 25 to 30%, dilated cardiomyopathy, moderate to severe MR, normal structure and function of prosthetic AV. Cardiology is on board   Chronic atrial fibrillation with RVR (HCC) Heart rate improved after starting IV amiodarone -will be switched to p.o. on discharge Started on heparin  infusion-plan is to start on Eliquis on discharge. - Failed 2 attempts of DCCV today   CAD with history of CABG and stents (coronary artery disease) Elevated troponin (flat trend 52-> 52) No complaints of chest pain, EKG nonacute and troponins slightly elevated but flat 52-52 likely demand ischemia  Continue  rosuvastatin  as well as nitroglycerin  sublingual as needed chest pain -Aspirin  was discontinued, cardiology likely will discontinue Plavix  also after starting him on Eliquis   ESRD on dialysis [facility]) Nephrology consulted for continuation of dialysis,  - Received his scheduled dialysis today  Diabetes mellitus, type II (HCC) Sliding scale insulin  coverage   Aneurysm of thoracic aorta 6.8 cm.  Recommendation for semiannual imaging  DVT prophylaxis: heparin  Code Status: DNR Family Communication: Discussed with daughter at bedside  Level of care: Progressive Status is: Inpatient  Consultants:  Nephrology Cardiology  Procedures: hemodialysis  Antimicrobials:  none   Subjective: Patient with no new concern, getting dialysis and will be going for cardioversion later today.  Objective: Vitals:   04/02/24 1200 04/02/24 1248  04/02/24 1500 04/02/24 1515  BP: (!) 92/53 (!) 102/54 (!) 85/44 (!) 81/48  Pulse: 81 95 78 72  Resp: 11 15 17 14   Temp: 97.7 F (36.5 C) (!) 96.2 F (35.7 C)    TempSrc: Axillary Temporal     SpO2: 100% 99% 100% 100%  Weight:        Intake/Output Summary (Last 24 hours) at 04/02/2024 1538 Last data filed at 04/02/2024 1446 Gross per 24 hour  Intake 842.23 ml  Output 1900 ml  Net -1057.77 ml   Filed Weights   04/01/24 0500 04/02/24 0501 04/02/24 0812  Weight: 82.4 kg 81.5 kg 84.1 kg    Examination:  General.  Frail elderly man, in no acute distress. Pulmonary.  Lungs clear bilaterally, normal respiratory effort. CV.  Irregularly irregular Abdomen.  Soft, nontender, nondistended, BS positive. CNS.  Alert and oriented .  No focal neurologic deficit. Extremities.  No edema, no cyanosis, pulses intact and symmetrical.   Data Reviewed: I have personally reviewed following labs and imaging studies  CBC: Recent Labs  Lab 03/29/24 1552 03/30/24 1708 03/31/24 1634 04/02/24 0332  WBC 5.9 5.4 5.7 6.3  HGB 12.5* 12.9* 12.0* 12.5*  HCT 38.6* 39.4 36.3* 37.5*  MCV 97.7 96.3 96.8 95.9  PLT 85* 85* 85* 86*   Basic Metabolic Panel: Recent Labs  Lab 03/29/24 1552 03/30/24 1708 03/31/24 0451 04/01/24 0411 04/02/24 0332  NA 133* 135 132* 133* 129*  K 4.2 5.0 4.7 5.4* 5.5*  CL 95* 97* 100 96* 93*  CO2 25 24 22  21* 22  GLUCOSE 121* 118* 137* 167* 159*  BUN 44* 20 31* 49* 68*  CREATININE 2.36* 1.44* 2.11* 2.84* 3.86*  CALCIUM  9.1 9.0 8.8* 9.0 9.0  MG  --   --  2.2  --   --   PHOS  --  2.3*  --   --   --    GFR: Estimated Creatinine Clearance: 12.9 mL/min (A) (by C-G formula based on SCr of 3.86 mg/dL (H)). Liver Function Tests: Recent Labs  Lab 03/30/24 1708  ALBUMIN  3.5   No results for input(s): LIPASE, AMYLASE in the last 168 hours. No results for input(s): AMMONIA in the last 168 hours. Coagulation Profile: Recent Labs  Lab 04/02/24 0332  INR 1.1   Cardiac Enzymes: No results for input(s): CKTOTAL, CKMB, CKMBINDEX, TROPONINI in the last 168 hours. BNP (last 3 results) No results for input(s): PROBNP in the last 8760  hours. HbA1C: No results for input(s): HGBA1C in the last 72 hours.  CBG: Recent Labs  Lab 04/01/24 0750 04/01/24 1145 04/01/24 1553 04/01/24 2103 04/02/24 1256  GLUCAP 151* 177* 144* 184* 82   Lipid Profile: No results for input(s): CHOL, HDL, LDLCALC, TRIG, CHOLHDL, LDLDIRECT in the last 72 hours. Thyroid  Function Tests: No results for input(s): TSH, T4TOTAL, FREET4, T3FREE, THYROIDAB in the last 72 hours. Anemia Panel: No results for input(s): VITAMINB12, FOLATE, FERRITIN, TIBC, IRON, RETICCTPCT in the last 72 hours. Urine analysis:    Component Value Date/Time   COLORURINE YELLOW (A) 05/24/2019 0021   APPEARANCEUR CLEAR (A) 05/24/2019 0021   LABSPEC 1.012 05/24/2019 0021   PHURINE 6.0 05/24/2019 0021   GLUCOSEU NEGATIVE 05/24/2019 0021   HGBUR NEGATIVE 05/24/2019 0021   BILIRUBINUR Negative 10/15/2022 1202   KETONESUR NEGATIVE 05/24/2019 0021   PROTEINUR Positive (A) 10/15/2022 1202   PROTEINUR 30 (A) 05/24/2019 0021   UROBILINOGEN 0.2 10/15/2022 1202   NITRITE Negative 10/15/2022 1202   NITRITE NEGATIVE 05/24/2019  0021   LEUKOCYTESUR Large (3+) (A) 10/15/2022 1202   LEUKOCYTESUR NEGATIVE 05/24/2019 0021   Sepsis Labs: @LABRCNTIP (procalcitonin:4,lacticidven:4)  ) Recent Results (from the past 240 hours)  MRSA Next Gen by PCR, Nasal     Status: None   Collection Time: 03/30/24 10:53 PM   Specimen: Nasal Mucosa; Nasal Swab  Result Value Ref Range Status   MRSA by PCR Next Gen NOT DETECTED NOT DETECTED Final    Comment: (NOTE) The GeneXpert MRSA Assay (FDA approved for NASAL specimens only), is one component of a comprehensive MRSA colonization surveillance program. It is not intended to diagnose MRSA infection nor to guide or monitor treatment for MRSA infections. Test performance is not FDA approved in patients less than 26 years old. Performed at [facility], [address] [place], [place] [address]      Radiology Studies: ECHO TEE Result Date: 04/02/2024    TRANSESOPHOGEAL ECHO REPORT   Patient Name:   Matthew [name] Date of Exam: 04/02/2024 Medical Rec #:  [id number]       Height:       70.0 in Accession #:    [id number]      Weight:       185.4 lb Date of Birth:  1931-04-29        BSA:          2.021 m Patient Age:    89 years        BP:           95/79 mmHg Patient Gender: M               HR:           90 bpm. Exam Location:  [facility] Procedure: Transesophageal Echo and Intracardiac Opacification Agent (Both            Spectral and Color Flow Doppler were utilized during procedure). Indications:     Persistent atrial fibrillation (HCC) [I48.19 (ICD-10-CM)]  History:         Patient has prior history of Echocardiogram examinations, most                  recent 03/30/2024. CAD; Arrythmias:Atrial Fibrillation.  Sonographer:     Ashley McNeely-Sloane Referring Phys:  [id number] [facility] PENDYAL Diagnosing Phys: Dearl Leaven PROCEDURE: The transesophogeal probe was passed without difficulty through the esophogus of the patient. Sedation performed by different physician. The patient was monitored while under deep sedation. Anesthestetic sedation was provided intravenously by Anesthesiology: 50mg  of Propofol . Image quality was adequate. The patient developed no complications during the procedure.  IMPRESSIONS  1. No thrombus in the left atrial appendage.  2. Moderate mitral regurgitation.  3. Moderate aortic regurgitation. Aortic valve is thickened and restricted.  4. Moderate tricuspid regurgitation.  5. Severe left ventricular systolic dysfunction.  6. Severely aneurysmal ascending thoracic aorta. Atherosclerotic plaque in descending aorta.     FINDINGS  Left Ventricle: Left ventricular ejection fraction, by estimation, is 20 to 25%. The left ventricle has severely decreased function. The left ventricle demonstrates global hypokinesis. Definity contrast agent was given IV to delineate the left ventricular endocardial  borders. The left ventricular internal cavity size was normal in size. Right Ventricle: The right ventricular size is not well visualized. Right vetricular wall thickness was not well visualized. Right ventricular systolic function was not well visualized. Left Atrium: Injection of Definity contrast demonstrates no filling defect within the LA or LAA. Left atrial size was severely dilated. No left atrial/left atrial  appendage thrombus was detected. Right Atrium: Right atrial size was not assessed. Pericardium: There is no evidence of pericardial effusion. Mitral Valve: The mitral valve is normal in structure. Moderate mitral valve regurgitation. Tricuspid Valve: The tricuspid valve is normal in structure. Tricuspid valve regurgitation is moderate. Aortic Valve: The aortic valve is calcified. There is moderate thickening of the aortic valve. Aortic valve regurgitation is moderate. Pulmonic Valve: The pulmonic valve was not well visualized. Pulmonic valve regurgitation is trivial. Aorta: Severely dilated and aneurysmal Ao. There is severe (Grade IV) plaque. Venous: The pulmonary veins were not well visualized. IAS/Shunts: No atrial level shunt detected by color flow Doppler. Additional Comments: There is a small pleural effusion. Akshay Pendyal Electronically signed by Dearl Leaven Signature Date/Time: 04/02/2024/3:30:04 PM    Final     Scheduled Meds:  [FJM Hold] amiodarone   400 mg Oral BID   Followed by   [FJM Hold] amiodarone   200 mg Oral Daily   [MAR Hold] apixaban  2.5 mg Oral BID   [MAR Hold] Chlorhexidine  Gluconate Cloth  6 each Topical Q0600   [MAR Hold] clopidogrel   75 mg Oral Daily   [MAR Hold] feeding supplement  237 mL Oral BID BM   [MAR Hold] ferrous sulfate   325 mg Oral BID WC   [MAR Hold] furosemide   40 mg Intravenous BID   [MAR Hold] insulin  aspart  0-5 Units Subcutaneous QHS   [MAR Hold] insulin  aspart  0-6 Units Subcutaneous TID WC   [MAR Hold] levothyroxine   100 mcg Oral Q0600    [MAR Hold] rosuvastatin   40 mg Oral Daily   Continuous Infusions:  sodium chloride  20 mL/hr at 04/02/24 1339     LOS: 1 day   This record has been created using Conservation officer, historic buildings. Errors have been sought and corrected,but may not always be located. Such creation errors do not reflect on the standard of care.   Amaryllis Dare, MD [facility]   If 7PM-7AM, please contact night-coverage [email or web address] Password [facility] 04/02/2024, 3:38 PM

## 2024-04-02 NOTE — Progress Notes (Signed)
 [facility] CLINIC CARDIOLOGY PROGRESS NOTE   Patient ID: Matthew [name] MRN: [id number] DOB/AGE: 88-Apr-1932 88 y.o.  Admit date: 03/29/2024 Referring Physician D.r Delayne Solian Primary Physician Albina GORMAN Dine, MD  Primary Cardiologist Dr. Fernand Reason for Consultation AoCHF, atrial fibrillation RVR  HPI: Matthew [name] is a 89 y.o. male with a past medical history of CAD s/p CABG and stents, ESRD on HD MWF, atrial fibrillation(not on [facility], previously on amiodarone ), ascending aortic aneurysm, HFrEF(EF 30-35% 12/2022), HTN, HLD, DM who presented to the ED on 03/29/2024 for weakness and shortness of breath. Cardiology was consulted for further evaluation.   Interval History:  -Patient seen and examined this AM, resting in bed in dialysis.  -Feeling well overall without complaints, denies CP, SOB, palpitations.  -BP stable. HR remains controlled.   Review of systems complete and found to be negative unless listed above   Vitals:   04/02/24 0030 04/02/24 0100 04/02/24 0339 04/02/24 0501  BP: 105/69  104/60   Pulse: 90  75   Resp: (!) 24  20   Temp: (!) 97.4 F (36.3 C) 97.9 F (36.6 C) (!) 97.5 F (36.4 C)   TempSrc:      SpO2: 100%  (!) 54%   Weight:    81.5 kg     Intake/Output Summary (Last 24 hours) at 04/02/2024 9288 Last data filed at 04/02/2024 0600 Gross per 24 hour  Intake 882.23 ml  Output 600 ml  Net 282.23 ml     PHYSICAL EXAM General: Chronically ill appearing elderly male, well nourished, in no acute distress. HEENT: Normocephalic and atraumatic. Neck: No JVD.  Lungs: Normal respiratory effort on 2L [place]. Clear bilaterally to auscultation. No wheezes, crackles, rhonchi.  Heart: Irregularly irregular, controlled rate. Normal S1 and S2 without gallops or murmurs. Radial & DP pulses 2+ bilaterally. Abdomen: Non-distended appearing.  Msk: Normal strength and tone for age. Extremities: No clubbing, cyanosis or edema.   Neuro: Alert and oriented X 3. Psych: Mood  appropriate, affect congruent.    LABS: Basic Metabolic Panel: Recent Labs    03/30/24 1708 03/31/24 0451 04/01/24 0411 04/02/24 0332  NA 135 132* 133* 129*  K 5.0 4.7 5.4* 5.5*  CL 97* 100 96* 93*  CO2 24 22 21* 22  GLUCOSE 118* 137* 167* 159*  BUN 20 31* 49* 68*  CREATININE 1.44* 2.11* 2.84* 3.86*  CALCIUM  9.0 8.8* 9.0 9.0  MG  --  2.2  --   --   PHOS 2.3*  --   --   --    Liver Function Tests: Recent Labs    03/30/24 1708  ALBUMIN  3.5   No results for input(s): LIPASE, AMYLASE in the last 72 hours. CBC: Recent Labs    03/31/24 1634 04/02/24 0332  WBC 5.7 6.3  HGB 12.0* 12.5*  HCT 36.3* 37.5*  MCV 96.8 95.9  PLT 85* 86*   Cardiac Enzymes: No results for input(s): CKTOTAL, CKMB, CKMBINDEX, TROPONINIHS in the last 72 hours. BNP: No results for input(s): BNP in the last 72 hours. D-Dimer: No results for input(s): DDIMER in the last 72 hours. Hemoglobin A1C: No results for input(s): HGBA1C in the last 72 hours. Fasting Lipid Panel: No results for input(s): CHOL, HDL, LDLCALC, TRIG, CHOLHDL, LDLDIRECT in the last 72 hours. Thyroid  Function Tests: No results for input(s): TSH, T4TOTAL, T3FREE, THYROIDAB in the last 72 hours.  Invalid input(s): FREET3 Anemia Panel: No results for input(s): VITAMINB12, FOLATE, FERRITIN, TIBC, IRON, RETICCTPCT in the last  72 hours.  No results found.   ECHO 03/2024: 1. Prosthetic AoV.   2. Left ventricular ejection fraction, by estimation, is 25 to 30%. The left ventricle has severely decreased function. The left ventricle demonstrates global hypokinesis. The left ventricular internal cavity size was moderately dilated. Left ventricular diastolic function could not be evaluated.   3. Right ventricular systolic function is mildly reduced. The right ventricular size is moderately enlarged. Moderately increased right ventricular wall thickness.   4. Left atrial size was  moderately dilated.   5. Right atrial size was mildly dilated.   6. The mitral valve is normal in structure. Moderate to severe mitral  valve regurgitation.   7. The aortic valve is calcified. Aortic valve regurgitation is mild to moderate. Aortic valve sclerosis/calcification is present, without any evidence of aortic stenosis. Echo findings are consistent with normal structure and function of the aortic valve prosthesis.   TELEMETRY (personally reviewed): atrial fibrillation rate 70s  EKG (personally reviewed):  Atrial fibrillation rate of 94 nonspecific ST-T wave changes   DATA reviewed by me 04/02/24: last 24h vitals tele labs imaging I/O, hospitalist progress note, nephrology notes  Principal Problem:   Acute on chronic HFrEF (heart failure with reduced ejection fraction) (HCC) Active Problems:   Diabetes mellitus, type II (HCC)   CAD with history of CABG and stents (coronary artery disease)   Chronic hypotension   ESRD on dialysis (HCC)   Chronic atrial fibrillation with RVR (HCC)   Aneurysm of thoracic aorta   Acute on chronic congestive heart failure (HCC)   CHF exacerbation (HCC)    ASSESSMENT AND PLAN: Matthew [name] is a 89 y.o. male with a past medical history of CAD s/p CABG and stents, ESRD on HD MWF, atrial fibrillation(not on AC, previously on amiodarone ), ascending aortic aneurysm, HFrEF(EF 30-35% 12/2022), HTN, HLD, DM who presented to the ED on 03/29/2024 for weakness and shortness of breath. Cardiology was consulted for further evaluation.   # Acute on chronic HFrEF # Persistent atrial fibrillation # Coronary artery disease s/p CABG # ESRD on HD Patient presented with worsening SOB, weakness. BNP elevated at 746. EKG with AF with HR in the 90s. Started on diuresis and amiodarone  for rate/rhythm control. Echo this admission with EF 25-30% (worse than prior), global hypokinesis, moderate LV dilation, moderate RV enlargement with mild dysfunction, moderate LA dilation,  mild RA dilation, moderate to severe MR, mild to moderate AR with overall stable function of bioprosthetic AV.  -Continue IV lasix  40 mg BID. Continue with ESRD as per nephro. -Transition to PO amiodarone  today with load of 400 mg BID for 1 week followed by 200 mg daily.  -Transition to Eliquis 2.5 mg twice daily (dose appropriate given age, renal function). Communicated with HD nurse who will ensure patient gets a dose of Eliquis this AM. -Plan for TEE/DCCV this afternoon.  -Continue crestor  40 mg daily and plavix  75 mg daily.   This patient's case was discussed and created with Dr. Ammon and he is in agreement.  Signed:  Danita Bloch, PA-C  04/02/2024, 7:11 AM [facility] Cardiology

## 2024-04-02 NOTE — Progress Notes (Signed)
 Heart Failure Navigator Progress Note  Assessed for Heart & Vascular TOC clinic readiness.  Patient does not meet criteria due to current cardiologist Dr. Fernand and [facility] Cardiology consulted while he is in the hospital.  Navigator will sign off at this time.  Charmaine Pines, RN, BSN [facility] Heart Failure Navigator Secure Chat Only

## 2024-04-02 NOTE — Progress Notes (Signed)
 PHARMACY - ANTICOAGULATION CONSULT NOTE  Pharmacy Consult for heparin  infusion Indication: atrial fibrillation  Allergies  Allergen Reactions   Statins     Other Reaction(s): Unknown   Amoxicillin Diarrhea    Patient Measurements: Weight: 82.4 kg (181 lb 10.5 oz)  Vital Signs: Temp: 97.9 F (36.6 C) (11/10 0100) BP: 105/69 (11/10 0030) Pulse Rate: 90 (11/10 0030)  Labs: Recent Labs    03/30/24 1708 03/31/24 0451 03/31/24 1634 04/01/24 0411 04/02/24 0119  HGB 12.9*  --  12.0*  --   --   HCT 39.4  --  36.3*  --   --   PLT 85*  --  85*  --   --   HEPARINUNFRC  --   --   --   --  >1.10*  CREATININE 1.44* 2.11*  --  2.84*  --     Estimated Creatinine Clearance: 17.5 mL/min (A) (by C-G formula based on SCr of 2.84 mg/dL (H)).   Medical History: Past Medical History:  Diagnosis Date   Anginal pain    Arthritis    Ascending aortic aneurysm 07/18/2015   a.) TTE 07/18/2015: severe dilation of ascending aorta measuring 7.0 cm. b.) TTE 01/10/2018: aortic root 3.8 cm; ascending aorta 6.8 cm; refused surgical intervention/repair.   Atrial fibrillation (HCC)    a.) CHA2DS2-VASc = 6 (age x 2, HFrEF, HTN, previous MI, T2DM). b.) rate/rhythm maintained on amiodarone  + metoprolol  succinate; chronic antiplatelet therapy using clopidogrel    Bradycardia    Coronary artery disease    ESRD (end stage renal disease) (HCC)    GERD (gastroesophageal reflux disease)    HFrEF (heart failure with reduced ejection fraction) (HCC)    a.) TTE 12/03/2013: mod dec LV function; EF 35-40%; severe inferolateral and inferior HK; LA dilated; G3DD; PASP 52 mmHg. b.) TTE 07/18/2015: mild LV dysfunction with mild LVH; EF 45%; mild BAE. c.) TTE 01/10/2018: mod LV dysfunction; EF 45%; diffuse HK   HLD (hyperlipidemia)    Hypertension    IDA (iron deficiency anemia)    Long term current use of antithrombotics/antiplatelets    a.) clopidogrel    Myocardial infarction (HCC) 2000   Pneumonia    S/P CABG x  4 2001   a.) LIMA-LAD, SVG-D1, SVG-OM1, SVG-OM2, SVG-PDA   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   left frontal scalp, EDC   Squamous cell carcinoma of skin 05/05/2020   left temporal scalp, in situ, EDC 05/13/20   T2DM (type 2 diabetes mellitus) (HCC)    Valvular insufficiency 12/03/2013   a.) TTE 12/03/2013: EF 35-40%; mild AR/MR, b.) TTE 07/18/2015: EF 45%; mild AR/PR, mod TR, severe MR. c.) TTE 01/10/2018: mod AR/TR    Medications:  Scheduled:   Chlorhexidine  Gluconate Cloth  6 each Topical Q0600   clopidogrel   75 mg Oral Daily   feeding supplement  237 mL Oral BID BM   ferrous sulfate   325 mg Oral BID WC   furosemide   40 mg Intravenous BID   insulin  aspart  0-5 Units Subcutaneous QHS   insulin  aspart  0-6 Units Subcutaneous TID WC   levothyroxine   100 mcg Oral Q0600   rosuvastatin   40 mg Oral Daily   Infusions:   amiodarone  30 mg/hr (04/01/24 0755)   heparin      PRN: acetaminophen  **OR** acetaminophen , HYDROcodone -acetaminophen , hydrOXYzine, ondansetron  **OR** ondansetron  (ZOFRAN ) IV  Assessment: 89 y.o. male with PMH of CAD s/p CABG and stents, ESRD on HD MWF, atrial fibrillation (not on AC, previously on amiodarone ), ascending aortic aneurysm,  HFrEF(EF 30-35% 12/2022), HTN, HLD,DM, being admitted for CHF exacerbation   Goal of Therapy:  anti-Xa level 0.3-0.7 units/ml Monitor platelets by anticoagulation protocol: Yes   Plan:  11/10:  HL @ 0119 = > 1.10, elevated - phlebotomist stated she drew sample from opposite arm as heparin  infusion so will accept this as valid - will hold heparin  infusion for 1 hr and restart @ 900 units/hr - recheck HL 8 hrs after restart ---Continue to monitor H&H and platelets  Jaedyn Lard D 04/02/2024,2:23 AM

## 2024-04-02 NOTE — Telephone Encounter (Signed)
 Patient Product/process development scientist completed.    The patient is insured through [employer] Patient has Medicare and is not eligible for a copay card, but may be able to apply for patient assistance or Medicare RX Payment Plan (Patient Must reach out to their plan, if eligible for payment plan), if available.    Ran test claim for Eliquis  5 mg and the current 30 day co-pay is $47.00.  Ran test claim for Xarelto 20 mg and the current 30 day co-pay is $47.00.  This test claim was processed through [facility]- copay amounts may vary at other pharmacies due to pharmacy/plan contracts, or as the patient moves through the different stages of their insurance plan.     Reyes Sharps, CPHT Pharmacy Technician Patient Advocate Specialist [facility] Patient Advocate Team Direct Number: [phone]  Fax: [phone]

## 2024-04-02 NOTE — Progress Notes (Signed)
 [facility]  ROUNDING NOTE   Subjective:   Matthew [name]  is a 89 y.o. male with end stage renal disease on hemodialysis, hypotension, hyperlipidemia, coronary artery disease status post CABG, atrial fibrillation, ascending aortic aneurysm, congestive hear failure, and diabetes mellitus type II.  Patient presented to the emergency department complaining of general weakness and has been admitted under observation for CHF exacerbation (HCC) [I50.9] Acute on chronic congestive heart failure, unspecified heart failure type [facility]) [I50.9]  Patient is known to our practice and receives outpatient dialysis treatments at [facility] on a MWF schedule, supervised by Dr. Marcelino.    Update Patient seen and evaluated during dialysis   HEMODIALYSIS FLOWSHEET:  Blood Flow Rate (mL/min): 400 mL/min Arterial Pressure (mmHg): -189.48 mmHg Venous Pressure (mmHg): 164.23 mmHg TMP (mmHg): 0.4 mmHg Ultrafiltration Rate (mL/min): 686 mL/min Dialysate Flow Rate (mL/min): 299 ml/min Dialysis Fluid Bolus: Normal Saline Bolus Amount (mL): 100 mL  Tolerating treatment well   Objective:  Vital signs in last 24 hours:  Temp:  [97.4 F (36.3 C)-98.2 F (36.8 C)] 97.8 F (36.6 C) (11/10 0812) Pulse Rate:  [70-90] 78 (11/10 1000) Resp:  [12-24] 12 (11/10 1000) BP: (88-105)/(48-69) 88/54 (11/10 1000) SpO2:  [54 %-100 %] 100 % (11/10 0930) Weight:  [81.5 kg-84.1 kg] 84.1 kg (11/10 0812)  Weight change: 1 kg Filed Weights   04/01/24 0500 04/02/24 0501 04/02/24 0812  Weight: 82.4 kg 81.5 kg 84.1 kg    Intake/Output: I/O last 3 completed shifts: In: 1247.1 [P.O.:580; I.V.:667.1] Out: 900 [Urine:900]   Intake/Output this shift:  No intake/output data recorded.  Physical Exam: General: NAD  Head: Normocephalic, atraumatic. Moist oral mucosal membranes  Eyes: Anicteric  Lungs:  Clear, normal effort  Heart: Irregular rate and rhythm  Abdomen:  Soft, nontender  Extremities: No  peripheral edema.  Neurologic: Awake, alert, conversant  Skin: Warm,dry, no rash  Access: Right IJ PermCath    Basic Metabolic Panel: Recent Labs  Lab 03/29/24 1552 03/30/24 1708 03/31/24 0451 04/01/24 0411 04/02/24 0332  NA 133* 135 132* 133* 129*  K 4.2 5.0 4.7 5.4* 5.5*  CL 95* 97* 100 96* 93*  CO2 25 24 22  21* 22  GLUCOSE 121* 118* 137* 167* 159*  BUN 44* 20 31* 49* 68*  CREATININE 2.36* 1.44* 2.11* 2.84* 3.86*  CALCIUM  9.1 9.0 8.8* 9.0 9.0  MG  --   --  2.2  --   --   PHOS  --  2.3*  --   --   --     Liver Function Tests: Recent Labs  Lab 03/30/24 1708  ALBUMIN  3.5   No results for input(s): LIPASE, AMYLASE in the last 168 hours. No results for input(s): AMMONIA in the last 168 hours.  CBC: Recent Labs  Lab 03/29/24 1552 03/30/24 1708 03/31/24 1634 04/02/24 0332  WBC 5.9 5.4 5.7 6.3  HGB 12.5* 12.9* 12.0* 12.5*  HCT 38.6* 39.4 36.3* 37.5*  MCV 97.7 96.3 96.8 95.9  PLT 85* 85* 85* 86*    Cardiac Enzymes: No results for input(s): CKTOTAL, CKMB, CKMBINDEX, TROPONINI in the last 168 hours.  BNP: Invalid input(s): POCBNP  CBG: Recent Labs  Lab 03/31/24 2139 04/01/24 0750 04/01/24 1145 04/01/24 1553 04/01/24 2103  GLUCAP 180* 151* 177* 144* 184*    Microbiology: Results for orders placed or performed during the hospital encounter of 03/29/24  MRSA Next Gen by PCR, Nasal     Status: None   Collection Time: 03/30/24 10:53 PM  Specimen: Nasal Mucosa; Nasal Swab  Result Value Ref Range Status   MRSA by PCR Next Gen NOT DETECTED NOT DETECTED Final    Comment: (NOTE) The GeneXpert MRSA Assay (FDA approved for NASAL specimens only), is one component of a comprehensive MRSA colonization surveillance program. It is not intended to diagnose MRSA infection nor to guide or monitor treatment for MRSA infections. Test performance is not FDA approved in patients less than 23 years old. Performed at [facility], [address] [place], [place] [address]     Coagulation Studies: Recent Labs    04/02/24 0332  LABPROT 15.2  INR 1.1    Urinalysis: No results for input(s): COLORURINE, LABSPEC, PHURINE, GLUCOSEU, HGBUR, BILIRUBINUR, KETONESUR, PROTEINUR, UROBILINOGEN, NITRITE, LEUKOCYTESUR in the last 72 hours.  Invalid input(s): APPERANCEUR    Imaging: No results found.    Medications:    sodium chloride  20 mL/hr at 04/02/24 0353   amiodarone  30 mg/hr (04/02/24 0351)    amiodarone   400 mg Oral BID   Followed by   NOREEN ON 04/09/2024] amiodarone   200 mg Oral Daily   apixaban  2.5 mg Oral BID   Chlorhexidine  Gluconate Cloth  6 each Topical Q0600   clopidogrel   75 mg Oral Daily   feeding supplement  237 mL Oral BID BM   ferrous sulfate   325 mg Oral BID WC   furosemide   40 mg Intravenous BID   insulin  aspart  0-5 Units Subcutaneous QHS   insulin  aspart  0-6 Units Subcutaneous TID WC   levothyroxine   100 mcg Oral Q0600   rosuvastatin   40 mg Oral Daily   acetaminophen  **OR** acetaminophen , HYDROcodone -acetaminophen , hydrOXYzine, ondansetron  **OR** ondansetron  (ZOFRAN ) IV  Assessment/ Plan:  [name] is a 89 y.o.  male with end stage renal disease on hemodialysis, hypotension, hyperlipidemia, coronary artery disease status post CABG, atrial fibrillation, ascending aortic aneurysm, congestive hear failure, and diabetes mellitus type II.Patient presents with weakness and his admitted under observation for CHF exacerbation (HCC) [I50.9] Acute on chronic congestive heart failure, unspecified heart failure type (HCC) [I50.9]    [facility] MWF L AVF   Acute on chronic heart failure  with bilateral pleural effusions seen on chest xray and CT chest. Mild shortness of breath. BNP 746. Last ECHO on 01/11/23 showed EF 35-40%, updated ECHO this admission shows EF 25-30 %. IV furosemide  40mg  twice daily.   2.  End-stage renal disease on hemodialysis.   Receiving dialysis today, UF goal 1.5L as tolerated. Next treatment scheduled for Wednesday.  3.  Atrial fibrillation, chronic.  Mild heart rate elevation.  Patient prescribed amiodarone  outpatient however has not started medication.  Placed on Amio and heparin  drip by cardiology.   4. Anemia of chronic kidney disease Lab Results  Component Value Date   HGB 12.5 (L) 04/02/2024    Hgb stable. No need for ESA during this admission.    LOS: 1 Cem Kosman 11/10/202510:37 AM

## 2024-04-02 NOTE — Progress Notes (Signed)
 PT Cancellation Note  Patient Details Name: Matthew [name] MRN: [id number] DOB: December 04, 1930   Cancelled Treatment:    Reason Eval/Treat Not Completed: Patient at procedure or test/unavailable   Sherlean DELENA Lesches 04/02/2024, 1:18 PM

## 2024-04-02 NOTE — Progress Notes (Signed)
   04/02/24 1200  Vitals  Temp 97.7 F (36.5 C)  Temp Source Axillary  BP (!) 92/53  MAP (mmHg) 66  Pulse Rate 81  ECG Heart Rate 82  Resp 11  Oxygen Therapy  SpO2 100 %  O2 Device Nasal Cannula  Patient Activity (if Appropriate) In bed  Pulse Oximetry Type Continuous  During Treatment Monitoring  Blood Flow Rate (mL/min) 399 mL/min  Arterial Pressure (mmHg) -194.94 mmHg  Venous Pressure (mmHg) 164.03 mmHg  TMP (mmHg) 0.4 mmHg  Ultrafiltration Rate (mL/min) 662 mL/min  Dialysate Flow Rate (mL/min) 299 ml/min  Dialysate Potassium Concentration 2  Dialysate Calcium  Concentration 2.5  Duration of HD Treatment -hour(s) 3.49 hour(s)  Cumulative Fluid Removed (mL) per Treatment  1293.34  HD Safety Checks Performed Yes  Intra-Hemodialysis Comments Tolerated well;Tx completed  Post Treatment  Dialyzer Clearance Clear  Hemodialysis Intake (mL) 0 mL  Liters Processed 84  Fluid Removed (mL) 1300 mL  Post-Hemodialysis Comments Tx has been tolerated. Patient remained stable. Access is clean, dry and intact. Patient has no verbalized concerns. Stable to return to procedural area.

## 2024-04-02 NOTE — Progress Notes (Signed)
 Transesophageal echocardiogram preliminary report  FERMON URETA [id number] 07/06/1930  Preliminary diagnosis  Atrial fibrillation with or without cardioversion  Postprocedural diagnosis same  Time out A timeout was performed by the nursing staff and physicians specifically identifying the procedure performed, identification of the patient, the type of sedation, all allergies and medications, all pertinent medical history, and presedation assessment of nasopharynx.  The patient and or family understand the risks of the procedure including the rare risks of death, stroke, heart attack, esophogeal perforation, sore throat, and reaction to medications given.  General anesthesia CRNA administered GA. See MAR.  50 mg propofol . The patient had continued monitoring of heart rate, oxygenation, blood pressure, respiratory rate, and extent of signs of sedation throughout the entire procedure.  The patient received anesthesia over a period of 20 minutes.  CRNA, nursing staff and I were present during the procedure when the patient was anesthetized for 100% of the time.  Treatment considerations No thrombus in LAA See separately dictated DCCV note  For further details of transesophageal echocardiogram please refer to final report.  Bonney ROCHER [facility] MD [facility] 04/02/2024 2:46 PM

## 2024-04-03 ENCOUNTER — Other Ambulatory Visit

## 2024-04-03 ENCOUNTER — Encounter: Payer: Self-pay | Admitting: Cardiovascular Disease

## 2024-04-03 DIAGNOSIS — I429 Cardiomyopathy, unspecified: Secondary | ICD-10-CM | POA: Diagnosis not present

## 2024-04-03 DIAGNOSIS — E782 Mixed hyperlipidemia: Secondary | ICD-10-CM | POA: Diagnosis not present

## 2024-04-03 DIAGNOSIS — I25810 Atherosclerosis of coronary artery bypass graft(s) without angina pectoris: Secondary | ICD-10-CM | POA: Diagnosis not present

## 2024-04-03 DIAGNOSIS — I480 Paroxysmal atrial fibrillation: Secondary | ICD-10-CM

## 2024-04-03 DIAGNOSIS — I5023 Acute on chronic systolic (congestive) heart failure: Secondary | ICD-10-CM | POA: Diagnosis not present

## 2024-04-03 LAB — CBC
HCT: 37.1 % — ABNORMAL LOW (ref 39.0–52.0)
Hemoglobin: 12.3 g/dL — ABNORMAL LOW (ref 13.0–17.0)
MCH: 31.8 pg (ref 26.0–34.0)
MCHC: 33.2 g/dL (ref 30.0–36.0)
MCV: 95.9 fL (ref 80.0–100.0)
Platelets: 84 K/uL — ABNORMAL LOW (ref 150–400)
RBC: 3.87 MIL/uL — ABNORMAL LOW (ref 4.22–5.81)
RDW: 14.7 % (ref 11.5–15.5)
WBC: 5.3 K/uL (ref 4.0–10.5)
nRBC: 0 % (ref 0.0–0.2)

## 2024-04-03 LAB — GLUCOSE, CAPILLARY
Glucose-Capillary: 109 mg/dL — ABNORMAL HIGH (ref 70–99)
Glucose-Capillary: 128 mg/dL — ABNORMAL HIGH (ref 70–99)

## 2024-04-03 LAB — BASIC METABOLIC PANEL WITH GFR
Anion gap: 11 (ref 5–15)
BUN: 39 mg/dL — ABNORMAL HIGH (ref 8–23)
CO2: 26 mmol/L (ref 22–32)
Calcium: 8.9 mg/dL (ref 8.9–10.3)
Chloride: 93 mmol/L — ABNORMAL LOW (ref 98–111)
Creatinine, Ser: 2.69 mg/dL — ABNORMAL HIGH (ref 0.61–1.24)
GFR, Estimated: 22 mL/min — ABNORMAL LOW (ref >60)
Glucose, Bld: 157 mg/dL — ABNORMAL HIGH (ref 70–99)
Potassium: 5.5 mmol/L — ABNORMAL HIGH (ref 3.5–5.1)
Sodium: 130 mmol/L — ABNORMAL LOW (ref 135–145)

## 2024-04-03 MED ORDER — AMIODARONE HCL 200 MG PO TABS: ORAL_TABLET | ORAL | 0 refills | Status: DC

## 2024-04-03 MED ORDER — HYDROXYZINE HCL 10 MG PO TABS: 10.0000 mg | ORAL_TABLET | Freq: Three times a day (TID) | ORAL | 0 refills | Status: DC | PRN

## 2024-04-03 MED ORDER — APIXABAN 2.5 MG PO TABS: 2.5000 mg | ORAL_TABLET | Freq: Two times a day (BID) | ORAL | 2 refills | Status: DC

## 2024-04-03 MED ORDER — RENA-VITE PO TABS: 1.0000 | ORAL_TABLET | Freq: Every day | ORAL | Status: DC

## 2024-04-03 MED ORDER — NEPRO/CARBSTEADY PO LIQD: 237.0000 mL | Freq: Two times a day (BID) | ORAL | Status: DC

## 2024-04-03 MED ORDER — NEPRO/CARBSTEADY PO LIQD: 237.0000 mL | Freq: Two times a day (BID) | ORAL | 3 refills | Status: AC

## 2024-04-03 MED ORDER — PATIROMER SORBITEX CALCIUM 8.4 G PO PACK
16.8000 g | PACK | Freq: Every day | ORAL | Status: DC
  Administered 2024-04-03: 16.8 g via ORAL
  Filled 2024-04-03 (×2): qty 2

## 2024-04-03 MED ORDER — PATIROMER SORBITEX CALCIUM 8.4 G PO PACK: 16.8000 g | PACK | Freq: Every day | ORAL | 0 refills | Status: AC

## 2024-04-03 MED ORDER — FUROSEMIDE 40 MG PO TABS
40.0000 mg | ORAL_TABLET | Freq: Every day | ORAL | 11 refills | Status: AC
Start: 2024-04-03 — End: 2025-04-03

## 2024-04-03 MED ORDER — MIDODRINE HCL 5 MG PO TABS
5.0000 mg | ORAL_TABLET | Freq: Once | ORAL | Status: AC
  Administered 2024-04-03: 5 mg via ORAL
  Filled 2024-04-03: qty 1

## 2024-04-03 NOTE — Progress Notes (Signed)
 D/C order noted. Contacted [facility]  MWF advised of pt and d/c today. The pt should resume care on 04/04/24. Requested documents faxed to clinic for continuation of care.  Suzen Satchel Dialysis Navigator [email or web address]

## 2024-04-03 NOTE — TOC Transition Note (Signed)
 Transition of Care [facility]) - Discharge Note   Patient Details  Name: Matthew [name] MRN: [id number] Date of Birth: [birth year]  Transition of Care [facility]) CM/SW Contact:  Alfonso Rummer, LCSW Phone Number: 04/03/2024, 2:39 PM   Clinical Narrative:    Pt is discharge home with [facility]. O2 order completed thru [facility]. No further TOC needs. TOC signing off.    Final next level of care: Home w Home Health Services Barriers to Discharge: Barriers Resolved   Patient Goals and CMS Choice            Discharge Placement                 Home with [facility]       Discharge Plan and Services Additional resources added to the After Visit Summary for                    DME Agency: [facility] Date DME Agency Contacted: 04/03/24   Representative spoke with at DME Agency: Thomasina with [facility] Arranged: PT, OT HH Agency: Other - See comment ([facility]) Date HH Agency Contacted: 04/03/24   Representative spoke with at [facility] Agency: Shaun with [facility]  Social Drivers of Health (SDOH) Interventions SDOH Screenings   Food Insecurity: No Food Insecurity (03/30/2024)  Housing: Low Risk  (03/30/2024)  Transportation Needs: No Transportation Needs (03/30/2024)  Utilities: At Risk (03/30/2024)  Depression (PHQ2-9): Medium Risk (10/04/2023)  Financial Resource Strain: Low Risk  (01/04/2024)   Received from [facility]  Physical Activity: Inactive (01/10/2018)  Social Connections: Moderately Integrated (03/30/2024)  Stress: No Stress Concern Present (01/10/2018)  Tobacco Use: Medium Risk (03/29/2024)     Readmission Risk Interventions    02/05/2023   11:48 AM 01/09/2023    4:42 PM  Readmission Risk Prevention Plan  Transportation Screening Complete Complete  PCP or Specialist Appt within 3-5 Days  Complete  HRI or Home Care Consult  Complete  Social Work Consult for Recovery Care Planning/Counseling  Complete  Palliative Care Screening   Not Applicable  Medication Review Oceanographer) Complete Complete  PCP or Specialist appointment within 3-5 days of discharge Complete   HRI or Home Care Consult Complete   SW Recovery Care/Counseling Consult Complete   Palliative Care Screening Not Applicable   Skilled Nursing Facility Complete

## 2024-04-03 NOTE — Care Management Important Message (Signed)
 Important Message  Patient Details  Name: Matthew [name] MRN: [id number] Date of Birth: 1930/08/12   Important Message Given:  Yes - Medicare IM     Rojelio SHAUNNA Rattler 04/03/2024, 4:15 PM

## 2024-04-03 NOTE — Progress Notes (Signed)
 [facility]  ROUNDING NOTE   Subjective:   Matthew [name]  is a 89 y.o. male with end stage renal disease on hemodialysis, hypotension, hyperlipidemia, coronary artery disease status post CABG, atrial fibrillation, ascending aortic aneurysm, congestive hear failure, and diabetes mellitus type II.  Patient presented to the emergency department complaining of general weakness and has been admitted under observation for CHF exacerbation (HCC) [I50.9] Acute on chronic congestive heart failure, unspecified heart failure type [facility]) [I50.9]  Patient is known to our practice and receives outpatient dialysis treatments at [facility] on a MWF schedule, supervised by Dr. Marcelino.    Update  Patient sitting up in bed Alert and eating breakfast Daughter at bedside Denies shortness of breath   Objective:  Vital signs in last 24 hours:  Temp:  [96.1 F (35.6 C)-98 F (36.7 C)] 97 F (36.1 C) (11/11 0524) Pulse Rate:  [67-95] 73 (11/10 1949) Resp:  [11-25] 23 (11/11 0500) BP: (78-102)/(42-61) 94/56 (11/11 0400) SpO2:  [90 %-100 %] 100 % (11/10 1949) Weight:  [81.6 kg] 81.6 kg (11/11 0524)  Weight change: 2.6 kg Filed Weights   04/02/24 0501 04/02/24 0812 04/03/24 0524  Weight: 81.5 kg 84.1 kg 81.6 kg    Intake/Output: I/O last 3 completed shifts: In: 842.2 [P.O.:240; I.V.:602.2] Out: 1900 [Urine:600; Other:1300]   Intake/Output this shift:  No intake/output data recorded.  Physical Exam: General: NAD  Head: Normocephalic, atraumatic. Moist oral mucosal membranes  Eyes: Anicteric  Lungs:  Clear, normal effort, 2L [place]  Heart: Irregular rate and rhythm  Abdomen:  Soft, nontender  Extremities: No peripheral edema.  Neurologic: Awake, alert, conversant  Skin: Warm,dry, no rash  Access: Right IJ PermCath    Basic Metabolic Panel: Recent Labs  Lab 03/30/24 1708 03/31/24 0451 04/01/24 0411 04/02/24 0332 04/03/24 0337  NA 135 132* 133* 129* 130*  K 5.0 4.7  5.4* 5.5* 5.5*  CL 97* 100 96* 93* 93*  CO2 24 22 21* 22 26  GLUCOSE 118* 137* 167* 159* 157*  BUN 20 31* 49* 68* 39*  CREATININE 1.44* 2.11* 2.84* 3.86* 2.69*  CALCIUM  9.0 8.8* 9.0 9.0 8.9  MG  --  2.2  --   --   --   PHOS 2.3*  --   --   --   --     Liver Function Tests: Recent Labs  Lab 03/30/24 1708  ALBUMIN  3.5   No results for input(s): LIPASE, AMYLASE in the last 168 hours. No results for input(s): AMMONIA in the last 168 hours.  CBC: Recent Labs  Lab 03/29/24 1552 03/30/24 1708 03/31/24 1634 04/02/24 0332 04/03/24 0337  WBC 5.9 5.4 5.7 6.3 5.3  HGB 12.5* 12.9* 12.0* 12.5* 12.3*  HCT 38.6* 39.4 36.3* 37.5* 37.1*  MCV 97.7 96.3 96.8 95.9 95.9  PLT 85* 85* 85* 86* 84*    Cardiac Enzymes: No results for input(s): CKTOTAL, CKMB, CKMBINDEX, TROPONINI in the last 168 hours.  BNP: Invalid input(s): POCBNP  CBG: Recent Labs  Lab 04/01/24 1553 04/01/24 2103 04/02/24 1256 04/02/24 1945 04/03/24 0801  GLUCAP 144* 184* 82 163* 109*    Microbiology: Results for orders placed or performed during the hospital encounter of 03/29/24  MRSA Next Gen by PCR, Nasal     Status: None   Collection Time: 03/30/24 10:53 PM   Specimen: Nasal Mucosa; Nasal Swab  Result Value Ref Range Status   MRSA by PCR Next Gen NOT DETECTED NOT DETECTED Final    Comment: (NOTE)  The GeneXpert MRSA Assay (FDA approved for NASAL specimens only), is one component of a comprehensive MRSA colonization surveillance program. It is not intended to diagnose MRSA infection nor to guide or monitor treatment for MRSA infections. Test performance is not FDA approved in patients less than 8 years old. Performed at [facility], [address] [place], [place] [address]     Coagulation Studies: Recent Labs    04/02/24 0332  LABPROT 15.2  INR 1.1    Urinalysis: No results for input(s): COLORURINE, LABSPEC, PHURINE, GLUCOSEU, HGBUR, BILIRUBINUR,  KETONESUR, PROTEINUR, UROBILINOGEN, NITRITE, LEUKOCYTESUR in the last 72 hours.  Invalid input(s): APPERANCEUR    Imaging: ECHO TEE Result Date: 04/02/2024    TRANSESOPHOGEAL ECHO REPORT   Patient Name:   Matthew [name] Date of Exam: 04/02/2024 Medical Rec #:  [id number]       Height:       70.0 in Accession #:    [id number]      Weight:       185.4 lb Date of Birth:  1931-01-19        BSA:          2.021 m Patient Age:    89 years        BP:           95/79 mmHg Patient Gender: M               HR:           90 bpm. Exam Location:  [facility] Procedure: Transesophageal Echo and Intracardiac Opacification Agent (Both            Spectral and Color Flow Doppler were utilized during procedure). Indications:     Persistent atrial fibrillation (HCC) [I48.19 (ICD-10-CM)]  History:         Patient has prior history of Echocardiogram examinations, most                  recent 03/30/2024. CAD; Arrythmias:Atrial Fibrillation.  Sonographer:     Ashley McNeely-Sloane Referring Phys:  [id number] [facility] PENDYAL Diagnosing Phys: Dearl Leaven PROCEDURE: The transesophogeal probe was passed without difficulty through the esophogus of the patient. Sedation performed by different physician. The patient was monitored while under deep sedation. Anesthestetic sedation was provided intravenously by Anesthesiology: 50mg  of Propofol . Image quality was adequate. The patient developed no complications during the procedure.  IMPRESSIONS  1. No thrombus in the left atrial appendage.  2. Moderate mitral regurgitation.  3. Moderate aortic regurgitation. Aortic valve is thickened and restricted.  4. Moderate tricuspid regurgitation.  5. Severe left ventricular systolic dysfunction.  6. Severely aneurysmal ascending thoracic aorta. Atherosclerotic plaque in descending aorta.     FINDINGS  Left Ventricle: Left ventricular ejection fraction, by estimation, is 20 to 25%. The left ventricle has severely decreased function. The left ventricle  demonstrates global hypokinesis. Definity contrast agent was given IV to delineate the left ventricular endocardial borders. The left ventricular internal cavity size was normal in size. Right Ventricle: The right ventricular size is not well visualized. Right vetricular wall thickness was not well visualized. Right ventricular systolic function was not well visualized. Left Atrium: Injection of Definity contrast demonstrates no filling defect within the LA or LAA. Left atrial size was severely dilated. No left atrial/left atrial appendage thrombus was detected. Right Atrium: Right atrial size was not assessed. Pericardium: There is no evidence of pericardial effusion. Mitral Valve: The mitral valve is normal in structure. Moderate mitral valve regurgitation. Tricuspid  Valve: The tricuspid valve is normal in structure. Tricuspid valve regurgitation is moderate. Aortic Valve: The aortic valve is calcified. There is moderate thickening of the aortic valve. Aortic valve regurgitation is moderate. Pulmonic Valve: The pulmonic valve was not well visualized. Pulmonic valve regurgitation is trivial. Aorta: Severely dilated and aneurysmal Ao. There is severe (Grade IV) plaque. Venous: The pulmonary veins were not well visualized. IAS/Shunts: No atrial level shunt detected by color flow Doppler. Additional Comments: There is a small pleural effusion. Akshay Pendyal Electronically signed by Dearl Leaven Signature Date/Time: 04/02/2024/3:30:04 PM    Final       Medications:      amiodarone   400 mg Oral BID   Followed by   NOREEN ON 04/09/2024] amiodarone   200 mg Oral Daily   apixaban  2.5 mg Oral BID   Chlorhexidine  Gluconate Cloth  6 each Topical Q0600   feeding supplement (NEPRO CARB STEADY)  237 mL Oral BID BM   ferrous sulfate   325 mg Oral BID WC   furosemide   40 mg Intravenous BID   insulin  aspart  0-5 Units Subcutaneous QHS   insulin  aspart  0-6 Units Subcutaneous TID WC   levothyroxine   100 mcg Oral  Q0600   multivitamin  1 tablet Oral QHS   patiromer  16.8 g Oral Daily   rosuvastatin   40 mg Oral Daily   acetaminophen  **OR** acetaminophen , HYDROcodone -acetaminophen , hydrOXYzine, ondansetron  **OR** ondansetron  (ZOFRAN ) IV  Assessment/ Plan:  Matthew [name] is a 89 y.o.  male with end stage renal disease on hemodialysis, hypotension, hyperlipidemia, coronary artery disease status post CABG, atrial fibrillation, ascending aortic aneurysm, congestive hear failure, and diabetes mellitus type II.Patient presents with weakness and his admitted under observation for CHF exacerbation (HCC) [I50.9] Acute on chronic congestive heart failure, unspecified heart failure type (HCC) [I50.9]    [facility] MWF L AVF   Acute on chronic heart failure  with bilateral pleural effusions seen on chest xray and CT chest. Mild shortness of breath. BNP 746. Last ECHO on 01/11/23 showed EF 35-40%, updated ECHO this admission shows EF 25-30 %. IV furosemide  40mg  twice daily.   2.  End-stage renal disease on hemodialysis.  Dialysis received yesterday, UF 1.3L achieved.  Next treatment scheduled for Wednesday.  3.  Atrial fibrillation, chronic.  Mild heart rate elevation.  Patient prescribed amiodarone  outpatient however has not started medication. Attempted cardioversion, failed. Remains in atrial fib, rate controlled with oral Amiodarone .   4. Anemia of chronic kidney disease Lab Results  Component Value Date   HGB 12.3 (L) 04/03/2024    Hgb stable. No need for ESA during this admission.    LOS: 2 Wing Gfeller 11/11/202511:32 AM

## 2024-04-03 NOTE — Progress Notes (Signed)
 [facility] CLINIC CARDIOLOGY PROGRESS NOTE   Patient ID: Matthew [name] MRN: [id number] DOB/AGE: 1931-02-26 89 y.o.  Admit date: 03/29/2024 Referring Physician D.r Delayne Solian Primary Physician Albina GORMAN Dine, MD  Primary Cardiologist Dr. Fernand Reason for Consultation AoCHF, atrial fibrillation RVR  HPI: Matthew [name] is a 89 y.o. male with a past medical history of CAD s/p CABG and stents, ESRD on HD MWF, atrial fibrillation(not on [facility], previously on amiodarone ), ascending aortic aneurysm, HFrEF(EF 30-35% 12/2022), HTN, HLD, DM who presented to the ED on 03/29/2024 for weakness and shortness of breath. Cardiology was consulted for further evaluation.   Interval History:  -Patient seen and examined this AM, resting in bed with daughter at bedside. -Feeling well overall without complaints, denies CP, SOB, palpitations.  -BP remains borderline but stable and he is asymptomatic. HR controlled on tele.   Review of systems complete and found to be negative unless listed above   Vitals:   04/03/24 0300 04/03/24 0400 04/03/24 0500 04/03/24 0524  BP:  (!) 94/56    Pulse:      Resp: 13 13 (!) 23   Temp:    (!) 97 F (36.1 C)  TempSrc:      SpO2:      Weight:    81.6 kg     Intake/Output Summary (Last 24 hours) at 04/03/2024 0858 Last data filed at 04/02/2024 1446 Gross per 24 hour  Intake 200 ml  Output 1300 ml  Net -1100 ml     PHYSICAL EXAM General: Chronically ill appearing elderly male, well nourished, in no acute distress. HEENT: Normocephalic and atraumatic. Neck: No JVD.  Lungs: Normal respiratory effort on 2L [place]. Clear bilaterally to auscultation. No wheezes, crackles, rhonchi.  Heart: Irregularly irregular, controlled rate. Normal S1 and S2 without gallops or murmurs. Radial & DP pulses 2+ bilaterally. Abdomen: Non-distended appearing.  Msk: Normal strength and tone for age. Extremities: No clubbing, cyanosis or edema.   Neuro: Alert and oriented X 3. Psych: Mood  appropriate, affect congruent.    LABS: Basic Metabolic Panel: Recent Labs    04/02/24 0332 04/03/24 0337  NA 129* 130*  K 5.5* 5.5*  CL 93* 93*  CO2 22 26  GLUCOSE 159* 157*  BUN 68* 39*  CREATININE 3.86* 2.69*  CALCIUM  9.0 8.9   Liver Function Tests: No results for input(s): AST, ALT, ALKPHOS, BILITOT, PROT, ALBUMIN  in the last 72 hours.  No results for input(s): LIPASE, AMYLASE in the last 72 hours. CBC: Recent Labs    04/02/24 0332 04/03/24 0337  WBC 6.3 5.3  HGB 12.5* 12.3*  HCT 37.5* 37.1*  MCV 95.9 95.9  PLT 86* 84*   Cardiac Enzymes: No results for input(s): CKTOTAL, CKMB, CKMBINDEX, TROPONINIHS in the last 72 hours. BNP: No results for input(s): BNP in the last 72 hours. D-Dimer: No results for input(s): DDIMER in the last 72 hours. Hemoglobin A1C: No results for input(s): HGBA1C in the last 72 hours. Fasting Lipid Panel: No results for input(s): CHOL, HDL, LDLCALC, TRIG, CHOLHDL, LDLDIRECT in the last 72 hours. Thyroid  Function Tests: No results for input(s): TSH, T4TOTAL, T3FREE, THYROIDAB in the last 72 hours.  Invalid input(s): FREET3 Anemia Panel: No results for input(s): VITAMINB12, FOLATE, FERRITIN, TIBC, IRON, RETICCTPCT in the last 72 hours.  ECHO TEE Result Date: 04/02/2024    TRANSESOPHOGEAL ECHO REPORT   Patient Name:   Matthew [name] Date of Exam: 04/02/2024 Medical Rec #:  [id number]  Height:       70.0 in Accession #:    [id number]      Weight:       185.4 lb Date of Birth:  [birth year]        BSA:          2.021 m Patient Age:    89 years        BP:           95/79 mmHg Patient Gender: M               HR:           90 bpm. Exam Location:  [facility] Procedure: Transesophageal Echo and Intracardiac Opacification Agent (Both            Spectral and Color Flow Doppler were utilized during procedure). Indications:     Persistent atrial fibrillation (HCC) [I48.19 (ICD-10-CM)]  History:          Patient has prior history of Echocardiogram examinations, most                  recent 03/30/2024. CAD; Arrythmias:Atrial Fibrillation.  Sonographer:     Ashley McNeely-Sloane Referring Phys:  [id number] [facility] PENDYAL Diagnosing Phys: Matthew [name] PROCEDURE: The transesophogeal probe was passed without difficulty through the esophogus of the patient. Sedation performed by different physician. The patient was monitored while under deep sedation. Anesthestetic sedation was provided intravenously by Anesthesiology: 50mg  of Propofol . Image quality was adequate. The patient developed no complications during the procedure.  IMPRESSIONS  1. No thrombus in the left atrial appendage.  2. Moderate mitral regurgitation.  3. Moderate aortic regurgitation. Aortic valve is thickened and restricted.  4. Moderate tricuspid regurgitation.  5. Severe left ventricular systolic dysfunction.  6. Severely aneurysmal ascending thoracic aorta. Atherosclerotic plaque in descending aorta.     FINDINGS  Left Ventricle: Left ventricular ejection fraction, by estimation, is 20 to 25%. The left ventricle has severely decreased function. The left ventricle demonstrates global hypokinesis. Definity contrast agent was given IV to delineate the left ventricular endocardial borders. The left ventricular internal cavity size was normal in size. Right Ventricle: The right ventricular size is not well visualized. Right vetricular wall thickness was not well visualized. Right ventricular systolic function was not well visualized. Left Atrium: Injection of Definity contrast demonstrates no filling defect within the LA or LAA. Left atrial size was severely dilated. No left atrial/left atrial appendage thrombus was detected. Right Atrium: Right atrial size was not assessed. Pericardium: There is no evidence of pericardial effusion. Mitral Valve: The mitral valve is normal in structure. Moderate mitral valve regurgitation. Tricuspid Valve: The  tricuspid valve is normal in structure. Tricuspid valve regurgitation is moderate. Aortic Valve: The aortic valve is calcified. There is moderate thickening of the aortic valve. Aortic valve regurgitation is moderate. Pulmonic Valve: The pulmonic valve was not well visualized. Pulmonic valve regurgitation is trivial. Aorta: Severely dilated and aneurysmal Ao. There is severe (Grade IV) plaque. Venous: The pulmonary veins were not well visualized. IAS/Shunts: No atrial level shunt detected by color flow Doppler. Additional Comments: There is a small pleural effusion. Akshay Pendyal Electronically signed by Matthew [name] Signature Date/Time: 04/02/2024/3:30:04 PM    Final      ECHO 03/2024: 1. Prosthetic AoV.   2. Left ventricular ejection fraction, by estimation, is 25 to 30%. The left ventricle has severely decreased function. The left ventricle demonstrates global hypokinesis. The left ventricular internal cavity size was moderately dilated. Left ventricular diastolic  function could not be evaluated.   3. Right ventricular systolic function is mildly reduced. The right ventricular size is moderately enlarged. Moderately increased right ventricular wall thickness.   4. Left atrial size was moderately dilated.   5. Right atrial size was mildly dilated.   6. The mitral valve is normal in structure. Moderate to severe mitral  valve regurgitation.   7. The aortic valve is calcified. Aortic valve regurgitation is mild to moderate. Aortic valve sclerosis/calcification is present, without any evidence of aortic stenosis. Echo findings are consistent with normal structure and function of the aortic valve prosthesis.   TELEMETRY (personally reviewed): atrial fibrillation rate 70s  EKG (personally reviewed):  Atrial fibrillation rate of 94 nonspecific ST-T wave changes   DATA reviewed by me 04/03/24: last 24h vitals tele labs imaging I/O, hospitalist progress note, nephrology notes  Principal Problem:    Acute on chronic HFrEF (heart failure with reduced ejection fraction) (HCC) Active Problems:   Diabetes mellitus, type II (HCC)   CAD with history of CABG and stents (coronary artery disease)   Chronic hypotension   ESRD on dialysis (HCC)   Chronic atrial fibrillation with RVR (HCC)   Aneurysm of thoracic aorta   Acute on chronic congestive heart failure (HCC)   CHF exacerbation (HCC)    ASSESSMENT AND PLAN: Matthew [name] is a 89 y.o. male with a past medical history of CAD s/p CABG and stents, ESRD on HD MWF, atrial fibrillation(not on AC, previously on amiodarone ), ascending aortic aneurysm, HFrEF(EF 30-35% 12/2022), HTN, HLD, DM who presented to the ED on 03/29/2024 for weakness and shortness of breath. Cardiology was consulted for further evaluation.   # Acute on chronic HFrEF # Persistent atrial fibrillation # Coronary artery disease s/p CABG # ESRD on HD Patient presented with worsening SOB, weakness. BNP elevated at 746. EKG with AF with HR in the 90s. Started on diuresis and amiodarone  for rate/rhythm control. Echo this admission with EF 25-30% (worse than prior), global hypokinesis, moderate LV dilation, moderate RV enlargement with mild dysfunction, moderate LA dilation, mild RA dilation, moderate to severe MR, mild to moderate AR with overall stable function of bioprosthetic AV. 04/02/24 TEE without LA thrombus but unsuccessful DCCV x2. -HD/lasix  as per nephro. -Continue PO amiodarone  today with load of 400 mg BID for 1 week followed by 200 mg daily.  -Continue Eliquis 2.5 mg twice daily (dose appropriate given age, renal function). -Will stop plavix  with the initation of Eliquis. Continue crestor  40 mg daily.  Ok for discharge today from a cardiac perspective. Will arrange for follow up in clinic with Dr. Fernand in 1-2 weeks.    This patient's case was discussed and created with Dr. Ammon and he is in agreement.  Signed:  Danita Bloch, PA-C  04/03/2024, 8:58 AM [facility]

## 2024-04-03 NOTE — Progress Notes (Addendum)
 SATURATION QUALIFICATIONS:   Patient Saturations on Room Air at Rest = 92%  Patient Saturations on Room Air while Ambulating = 86%  Patient Saturations on 2 Liters of oxygen while Ambulating = 94%  Patient requires oxygen while ambulating.

## 2024-04-03 NOTE — Discharge Summary (Signed)
 Physician Discharge Summary   Patient: Matthew [name] MRN: [id number] DOB: [birth year]  Admit date:     03/29/2024  Discharge date: 04/03/24  Discharge Physician: Amaryllis Dare   PCP: Albina GORMAN Dine, MD   Recommendations at discharge:  Please obtain CBC and BMP on follow-up Follow-up with cardiology Follow-up with primary care provider Continue with scheduled dialysis-please address the need of continuation of Veltassa depending on his potassium after dialysis  Discharge Diagnoses: Principal Problem:   Acute on chronic HFrEF (heart failure with reduced ejection fraction) (HCC) Active Problems:   Chronic atrial fibrillation with RVR (HCC)   CAD with history of CABG and stents (coronary artery disease)   Chronic hypotension   ESRD on dialysis (HCC)   Diabetes mellitus, type II (HCC)   Aneurysm of thoracic aorta   Acute on chronic congestive heart failure (HCC)   CHF exacerbation (HCC)   Paroxysmal atrial fibrillation [facility])   Hospital Course: Matthew [name] is a 89 y.o. male with medical history significant for CAD s/p CABG and stents, ESRD on HD MWF, atrial fibrillation(not on AC, previously on amiodarone ), ascending aortic aneurysm, HFrEF(EF 30-35% 12/2022), HTN, HLD,DM, being admitted for CHF exacerbation after presenting with a history of  generalized weakness, shortness of breath.  He denies chest pain or leg swelling.  He recently saw his cardiologist, Dr. Fernand who ordered an echocardiogram (scheduled for 04/03/24) and  placed him back on amiodarone  but it has not yet been filled.  Patient was admitted for acute on chronic HFrEF with BNP of 746.  He was diuresed with IV Lasix  and also received extra session of dialysis.  Echocardiogram with a EF of 25 to 30%, dilated cardiomyopathy, moderate to severe MR, normal structure and function of prosthetic AV.  Cardiology was consulted.  Patient also developed RVR with his history of chronic atrial fibrillation, he was started on  amiodarone .  Had 2 failed attempts of DCCV and will continue with amiodarone  which also includes a loading dose.  He was also started on low-dose Eliquis by cardiology and home Plavix  was discontinued to decrease the risk of bleeding.  Patient had mildly increased troponin with a flat trend likely secondary to demand ischemia with acute on chronic heart failure.  He will continue with his home medications and follow-up with his cardiologist for further assistance.  Home Eliquis was discontinued when he was started on Eliquis.  Patient continued to have mild hyperkalemia, did received dialysis which includes some extra sessions to remove extra fluid.  Discussed with nephrology and he received a dose of Veltassa before discharge, he was given 3 more doses and nephrology will follow-up during dialysis for further advice.  Patient also has an aneurysm of thoracic aorta, of about 6.8 cm in size which need a close outpatient surveillance by vascular surgery.  Patient continued to required oxygen especially with ambulation.  He was given oxygen to use at 2 L for home before discharge.  Physical therapist evaluated him and recommended home health services which was ordered.  Patient is high risk for readmission and mortality based on advanced age and significant underlying comorbidities.  He will continue on current medications and need to have a close follow-up with his providers for further assistance.   Consultants: Cardiology.  Nephrology Procedures performed: DCCV.  Hemodialysis Disposition: Home health Diet recommendation:  Discharge Diet Orders (From admission, onward)     Start     Ordered   04/03/24 0000  Diet - low sodium heart healthy  04/03/24 1129           Renal diet DISCHARGE MEDICATION: Allergies as of 04/03/2024       Reactions   Statins    Other Reaction(s): Unknown   Amoxicillin Diarrhea        Medication List     STOP taking these medications     cetirizine 10 MG tablet Commonly known as: ZYRTEC   clopidogrel  75 MG tablet Commonly known as: PLAVIX    potassium chloride  SA 20 MEQ tablet Commonly known as: KLOR-CON  M       TAKE these medications    amiodarone  200 MG tablet Commonly known as: PACERONE  Take 2 tablets (400 mg total) by mouth 2 (two) times daily for 6 days, THEN 1 tablet (200 mg total) daily. Start taking on: April 03, 2024 What changed: See the new instructions.   apixaban 2.5 MG Tabs tablet Commonly known as: ELIQUIS Take 1 tablet (2.5 mg total) by mouth 2 (two) times daily.   aspirin  EC 81 MG tablet Take 81 mg by mouth daily. Swallow whole.   calcium  carbonate 750 MG chewable tablet Commonly known as: TUMS EX Chew 1 tablet by mouth 3 (three) times daily.   cholecalciferol  25 MCG (1000 UNIT) tablet Commonly known as: VITAMIN D3 Take 1,000 Units by mouth daily with breakfast.   feeding supplement (NEPRO CARB STEADY) Liqd Take 237 mLs by mouth 2 (two) times daily between meals.   ferrous sulfate  325 (65 FE) MG tablet Take 325 mg by mouth 2 (two) times daily with a meal.   Fish Oil  1000 MG Caps Take 4,000 mg by mouth daily with lunch.   fluticasone  50 MCG/ACT nasal spray Commonly known as: FLONASE  Place 2 sprays into both nostrils daily as needed for allergies or rhinitis.   furosemide  40 MG tablet Commonly known as: Lasix  Take 1 tablet (40 mg total) by mouth daily. What changed:  medication strength how much to take   glipiZIDE  5 MG tablet Commonly known as: GLUCOTROL  Take 2.5 mg by mouth daily before breakfast.   hydrOXYzine 10 MG tablet Commonly known as: ATARAX Take 1 tablet (10 mg total) by mouth 3 (three) times daily as needed for anxiety or itching.   levothyroxine  100 MCG tablet Commonly known as: SYNTHROID  TAKE 1 TABLET BY MOUTH IN THE  MORNING   midodrine  10 MG tablet Commonly known as: PROAMATINE  See administration instructions. Take 10 mg by mouth 1 hour before  dialysis and 30 minutes before procedure ends.   multivitamin Tabs tablet Take 1 tablet by mouth daily.   nitroGLYCERIN  0.4 MG SL tablet Commonly known as: NITROSTAT  DISSOLVE 1 TABLET UNDER THE  TONGUE EVERY 5 MINUTES AS NEEDED FOR CHEST PAIN. MAX OF 3 TABLETS IN 15 MINUTES. CALL 911 IF PAIN  PERSISTS.   patiromer 8.4 g packet Commonly known as: VELTASSA Take 2 packets (16.8 g total) by mouth daily for 3 days.   rosuvastatin  40 MG tablet Commonly known as: CRESTOR  Take 1 tablet (40 mg total) by mouth daily.   Senokot S 8.6-50 MG tablet Generic drug: senna-docusate Take 2 tablets by mouth at bedtime as needed for moderate constipation.   Xyzal  Allergy 24HR 5 MG tablet Generic drug: levocetirizine Take 5 mg by mouth every evening.               Durable Medical Equipment  (From admission, onward)           Start     Ordered   04/03/24 1102  For home use only DME oxygen  Once       Question Answer Comment  Length of Need Lifetime   Mode or (Route) Nasal cannula   Liters per Minute 2   Frequency Continuous (stationary and portable oxygen unit needed)   Oxygen conserving device Yes   Oxygen delivery system: Gas   Oxygen delivery system: Portable concentrator (POC)      04/03/24 1101              Discharge Care Instructions  (From admission, onward)           Start     Ordered   04/03/24 0000  Discharge wound care:       Comments: Cleanse L groin wound with NS, apply a strip of silver  hydrofiber Soila [phone]) cut to fit area daily and secure with silicone foam or gauze and clothe tape whichever works best.   04/03/24 1129            Follow-up Information     Fernand Denyse LABOR, MD. Go in 1 week(s).   Specialty: Cardiology Contact information: [address] [place] [address] [phone]         Albina GORMAN Dine, MD. Schedule an appointment as soon as possible for a visit in 1 week(s).   Specialty: Internal Medicine Contact  information: [address] [place] [address] [phone]                Discharge Exam: Fredricka Weights   04/02/24 0501 04/02/24 9187 04/03/24 0524  Weight: 81.5 kg 84.1 kg 81.6 kg   General.  Frail elderly man, in no acute distress. Pulmonary.  Lungs clear bilaterally, normal respiratory effort. CV.  Irregularly irregular Abdomen.  Soft, nontender, nondistended, BS positive. CNS.  Alert and oriented .  No focal neurologic deficit. Extremities.  No edema,  pulses intact and symmetrical. Psychiatry.  Judgment and insight appears normal.   Condition at discharge: stable  The results of significant diagnostics from this hospitalization (including imaging, microbiology, ancillary and laboratory) are listed below for reference.   Imaging Studies: ECHO TEE Result Date: 04/02/2024    TRANSESOPHOGEAL ECHO REPORT   Patient Name:   TILMON WISEHART Date of Exam: 04/02/2024 Medical Rec #:  [id number]       Height:       70.0 in Accession #:    [id number]      Weight:       185.4 lb Date of Birth:  30-Apr-1931        BSA:          2.021 m Patient Age:    89 years        BP:           95/79 mmHg Patient Gender: M               HR:           90 bpm. Exam Location:  [facility] Procedure: Transesophageal Echo and Intracardiac Opacification Agent (Both            Spectral and Color Flow Doppler were utilized during procedure). Indications:     Persistent atrial fibrillation (HCC) [I48.19 (ICD-10-CM)]  History:         Patient has prior history of Echocardiogram examinations, most                  recent 03/30/2024. CAD; Arrythmias:Atrial Fibrillation.  Sonographer:     Ashley McNeely-Sloane Referring Phys:  [id number] [facility]  PENDYAL Diagnosing Phys: Dearl Pendyal PROCEDURE: The transesophogeal probe was passed without difficulty through the esophogus of the patient. Sedation performed by different physician. The patient was monitored while under deep sedation. Anesthestetic sedation was provided  intravenously by Anesthesiology: 50mg  of Propofol . Image quality was adequate. The patient developed no complications during the procedure.  IMPRESSIONS  1. No thrombus in the left atrial appendage.  2. Moderate mitral regurgitation.  3. Moderate aortic regurgitation. Aortic valve is thickened and restricted.  4. Moderate tricuspid regurgitation.  5. Severe left ventricular systolic dysfunction.  6. Severely aneurysmal ascending thoracic aorta. Atherosclerotic plaque in descending aorta.     FINDINGS  Left Ventricle: Left ventricular ejection fraction, by estimation, is 20 to 25%. The left ventricle has severely decreased function. The left ventricle demonstrates global hypokinesis. Definity contrast agent was given IV to delineate the left ventricular endocardial borders. The left ventricular internal cavity size was normal in size. Right Ventricle: The right ventricular size is not well visualized. Right vetricular wall thickness was not well visualized. Right ventricular systolic function was not well visualized. Left Atrium: Injection of Definity contrast demonstrates no filling defect within the LA or LAA. Left atrial size was severely dilated. No left atrial/left atrial appendage thrombus was detected. Right Atrium: Right atrial size was not assessed. Pericardium: There is no evidence of pericardial effusion. Mitral Valve: The mitral valve is normal in structure. Moderate mitral valve regurgitation. Tricuspid Valve: The tricuspid valve is normal in structure. Tricuspid valve regurgitation is moderate. Aortic Valve: The aortic valve is calcified. There is moderate thickening of the aortic valve. Aortic valve regurgitation is moderate. Pulmonic Valve: The pulmonic valve was not well visualized. Pulmonic valve regurgitation is trivial. Aorta: Severely dilated and aneurysmal Ao. There is severe (Grade IV) plaque. Venous: The pulmonary veins were not well visualized. IAS/Shunts: No atrial level shunt detected by  color flow Doppler. Additional Comments: There is a small pleural effusion. Akshay Pendyal Electronically signed by Dearl Leaven Signature Date/Time: 04/02/2024/3:30:04 PM    Final    ECHOCARDIOGRAM COMPLETE Result Date: 03/31/2024    ECHOCARDIOGRAM REPORT   Patient Name:   ARRIE ZUERCHER Date of Exam: 03/30/2024 Medical Rec #:  [id number]     Height:       70.1 in Accession #:    [id number]    Weight:       191.1 lb Date of Birth:  1930-08-06      BSA:          2.049 m Patient Age:    89 years      BP:           97/64 mmHg Patient Gender: M             HR:           119 bpm. Exam Location:  [facility] Procedure: 2D Echo, Cardiac Doppler and Color Doppler (Both Spectral and Color            Flow Doppler were utilized during procedure). Indications:     CHF-Acute Systolic I50.21  History:         Patient has no prior history of Echocardiogram examinations.                  CHF; Arrythmias:Atrial Fibrillation.  Sonographer:     Ashley McNeely-Sloane Referring Phys:  Delayne Solian MD Diagnosing Phys: Cara JONETTA Lovelace MD IMPRESSIONS  1. Prosthetic AoV.  2. Left ventricular ejection fraction, by estimation, is 25 to 30%. The left  ventricle has severely decreased function. The left ventricle demonstrates global hypokinesis. The left ventricular internal cavity size was moderately dilated. Left ventricular diastolic function could not be evaluated.  3. Right ventricular systolic function is mildly reduced. The right ventricular size is moderately enlarged. Moderately increased right ventricular wall thickness.  4. Left atrial size was moderately dilated.  5. Right atrial size was mildly dilated.  6. The mitral valve is normal in structure. Moderate to severe mitral valve regurgitation.  7. The aortic valve is calcified. Aortic valve regurgitation is mild to moderate. Aortic valve sclerosis/calcification is present, without any evidence of aortic stenosis. Echo findings are consistent with normal structure and function of the  aortic valve prosthesis. FINDINGS  Left Ventricle: Left ventricular ejection fraction, by estimation, is 25 to 30%. The left ventricle has severely decreased function. The left ventricle demonstrates global hypokinesis. Strain was performed and the global longitudinal strain is indeterminate. The left ventricular internal cavity size was moderately dilated. There is borderline left ventricular hypertrophy. Left ventricular diastolic function could not be evaluated. Right Ventricle: The right ventricular size is moderately enlarged. Moderately increased right ventricular wall thickness. Right ventricular systolic function is mildly reduced. Left Atrium: Left atrial size was moderately dilated. Right Atrium: Right atrial size was mildly dilated. Pericardium: There is no evidence of pericardial effusion. Mitral Valve: The mitral valve is normal in structure. Moderate to severe mitral valve regurgitation. MV peak gradient, 31.2 mmHg. The mean mitral valve gradient is 25.0 mmHg. Tricuspid Valve: The tricuspid valve is grossly normal. Tricuspid valve regurgitation is mild. Aortic Valve: The aortic valve is calcified. Aortic valve regurgitation is mild to moderate. Aortic valve sclerosis/calcification is present, without any evidence of aortic stenosis. Aortic valve mean gradient measures 3.0 mmHg. Aortic valve peak gradient measures 5.5 mmHg. Aortic valve area, by VTI measures 2.94 cm. Echo findings are consistent with normal structure and function of the aortic valve prosthesis. Pulmonic Valve: The pulmonic valve was normal in structure. Pulmonic valve regurgitation is not visualized. Aorta: The ascending aorta was not well visualized. IAS/Shunts: No atrial level shunt detected by color flow Doppler. Additional Comments: Prosthetic AoV. 3D was performed not requiring image post processing on an independent workstation and was indeterminate.  LEFT VENTRICLE PLAX 2D LVIDd:         5.21 cm      Diastology LVIDs:          4.03 cm      LV e' medial:    5.92 cm/s LV PW:         1.10 cm      LV E/e' medial:  19.1 LV IVS:        1.13 cm      LV e' lateral:   11.10 cm/s LVOT diam:     2.20 cm      LV E/e' lateral: 10.2 LV SV:         54 LV SV Index:   27 LVOT Area:     3.80 cm  LV Volumes (MOD) LV vol d, MOD A2C: 150.0 ml LV vol d, MOD A4C: 141.0 ml LV vol s, MOD A2C: 127.0 ml LV vol s, MOD A4C: 108.0 ml LV SV MOD A2C:     23.0 ml LV SV MOD A4C:     141.0 ml LV SV MOD BP:      32.3 ml RIGHT VENTRICLE            IVC RV Basal diam:  6.23 cm  IVC diam: 2.45 cm RV Mid diam:    4.42 cm RV S prime:     5.98 cm/s TAPSE (M-mode): 0.7 cm LEFT ATRIUM           Index        RIGHT ATRIUM           Index LA diam:      5.00 cm 2.44 cm/m   RA Area:     28.70 cm LA Vol (A2C): 71.2 ml 34.75 ml/m  RA Volume:   104.00 ml 50.75 ml/m LA Vol (A4C): 44.4 ml 21.67 ml/m  AORTIC VALVE                    PULMONIC VALVE AV Area (Vmax):    2.58 cm     PV Vmax:        0.71 m/s AV Area (Vmean):   2.64 cm     PV Vmean:       44.700 cm/s AV Area (VTI):     2.94 cm     PV VTI:         0.109 m AV Vmax:           117.00 cm/s  PV Peak grad:   2.0 mmHg AV Vmean:          85.800 cm/s  PV Mean grad:   1.0 mmHg AV VTI:            0.185 m      RVOT Peak grad: 1 mmHg AV Peak Grad:      5.5 mmHg AV Mean Grad:      3.0 mmHg LVOT Vmax:         79.30 cm/s LVOT Vmean:        59.500 cm/s LVOT VTI:          0.143 m LVOT/AV VTI ratio: 0.77  AORTA Ao Root diam: 3.80 cm Ao Asc diam:  5.90 cm MITRAL VALVE MV Area (PHT): 3.63 cm     SHUNTS MV Area VTI:   0.73 cm     Systemic VTI:  0.14 m MV Peak grad:  31.2 mmHg    Systemic Diam: 2.20 cm MV Mean grad:  25.0 mmHg    Pulmonic VTI:  0.061 m MV Vmax:       2.80 m/s MV Vmean:      202.2 cm/s MV Decel Time: 209 msec MV E velocity: 113.00 cm/s Cara JONETTA Lovelace MD Electronically signed by Cara JONETTA Lovelace MD Signature Date/Time: 03/31/2024/1:30:27 PM    Final    CT CHEST WO CONTRAST Result Date: 03/29/2024 CLINICAL DATA:  Respiratory  illness, nondiagnostic x-ray, atrial fibrillation, generalized weakness EXAM: CT CHEST WITHOUT CONTRAST TECHNIQUE: Multidetector CT imaging of the chest was performed following the standard protocol without IV contrast. RADIATION DOSE REDUCTION: This exam was performed according to the departmental dose-optimization program which includes automated exposure control, adjustment of the mA and/or kV according to patient size and/or use of iterative reconstruction technique. COMPARISON:  03/29/2024 FINDINGS: Cardiovascular: Unenhanced imaging of the heart demonstrates cardiomegaly without pericardial effusion. There is a 6.8 cm ascending thoracic aortic aneurysm. Assessment of the aortic lumen cannot be performed without intravenous contrast. Extensive atherosclerosis throughout the aorta and native coronary vasculature. Postsurgical changes from previous CABG. Right internal jugular catheter tip within the superior vena cava. Mediastinum/Nodes: No enlarged mediastinal or axillary lymph nodes. Thyroid  gland, trachea, and esophagus demonstrate no significant findings. Lungs/Pleura: Small bilateral pleural effusions and dependent atelectasis.  No airspace disease or pneumothorax. The central airways are patent. Upper Abdomen: No acute abnormality. Musculoskeletal: No acute or destructive bony abnormalities. Diffuse thoracic spondylosis. Reconstructed images demonstrate no additional findings. IMPRESSION: 1. 6.8 cm ascending thoracic aortic aneurysm. Assessment of the aortic lumen cannot be performed without intravenous contrast. Recommend semi-annual imaging followup by CTA or MRA and referral to cardiothoracic surgery if not already obtained. This recommendation follows 2010 ACCF/AHA/AATS/ACR/ASA/SCA/SCAI/SIR/STS/SVM Guidelines for the Diagnosis and Management of Patients With Thoracic Aortic Disease. Circulation. 2010; 121: [id number]. Aortic aneurysm NOS (ICD10-I71.9) 2. Cardiomegaly without pericardial effusion. 3.  Small bilateral pleural effusions with dependent lower lobe atelectasis. 4. Aortic Atherosclerosis (ICD10-I70.0). Coronary artery atherosclerosis. Electronically Signed   By: Ozell Daring M.D.   On: 03/29/2024 20:18   DG Chest 2 View Result Date: 03/29/2024 CLINICAL DATA:  Shortness of breath EXAM: CHEST - 2 VIEW COMPARISON:  Chest radiograph dated 01/10/2023. FINDINGS: Right-sided dialysis catheter with tip over central SVC. Trace bilateral pleural effusions and left lung base atelectasis/scarring. Pneumonia is not excluded. No pneumothorax. Stable cardiac silhouette. Median sternotomy wires. Left axillary vascular stent. No acute osseous pathology. IMPRESSION: Trace bilateral pleural effusions and left lung base atelectasis/scarring. Pneumonia is not excluded. Electronically Signed   By: Vanetta Chou M.D.   On: 03/29/2024 16:29    Microbiology: Results for orders placed or performed during the hospital encounter of 03/29/24  MRSA Next Gen by PCR, Nasal     Status: None   Collection Time: 03/30/24 10:53 PM   Specimen: Nasal Mucosa; Nasal Swab  Result Value Ref Range Status   MRSA by PCR Next Gen NOT DETECTED NOT DETECTED Final    Comment: (NOTE) The GeneXpert MRSA Assay (FDA approved for NASAL specimens only), is one component of a comprehensive MRSA colonization surveillance program. It is not intended to diagnose MRSA infection nor to guide or monitor treatment for MRSA infections. Test performance is not FDA approved in patients less than 63 years old. Performed at [facility], [address] [place], [place] [address]     Labs: CBC: Recent Labs  Lab 03/29/24 1552 03/30/24 1708 03/31/24 1634 04/02/24 0332 04/03/24 0337  WBC 5.9 5.4 5.7 6.3 5.3  HGB 12.5* 12.9* 12.0* 12.5* 12.3*  HCT 38.6* 39.4 36.3* 37.5* 37.1*  MCV 97.7 96.3 96.8 95.9 95.9  PLT 85* 85* 85* 86* 84*   Basic Metabolic Panel: Recent Labs  Lab 03/30/24 1708 03/31/24 0451 04/01/24 0411  04/02/24 0332 04/03/24 0337  NA 135 132* 133* 129* 130*  K 5.0 4.7 5.4* 5.5* 5.5*  CL 97* 100 96* 93* 93*  CO2 24 22 21* 22 26  GLUCOSE 118* 137* 167* 159* 157*  BUN 20 31* 49* 68* 39*  CREATININE 1.44* 2.11* 2.84* 3.86* 2.69*  CALCIUM  9.0 8.8* 9.0 9.0 8.9  MG  --  2.2  --   --   --   PHOS 2.3*  --   --   --   --    Liver Function Tests: Recent Labs  Lab 03/30/24 1708  ALBUMIN  3.5   CBG: Recent Labs  Lab 04/01/24 1553 04/01/24 2103 04/02/24 1256 04/02/24 1945 04/03/24 0801  GLUCAP 144* 184* 82 163* 109*    Discharge time spent: greater than 30 minutes.  This record has been created using Conservation officer, historic buildings. Errors have been sought and corrected,but may not always be located. Such creation errors do not reflect on the standard of care.   Signed: Amaryllis Dare, MD [facility] 04/03/2024

## 2024-04-03 NOTE — Plan of Care (Signed)

## 2024-04-04 ENCOUNTER — Telehealth: Payer: Self-pay

## 2024-04-04 DIAGNOSIS — N250 Renal osteodystrophy: Secondary | ICD-10-CM | POA: Diagnosis not present

## 2024-04-04 DIAGNOSIS — N186 End stage renal disease: Secondary | ICD-10-CM | POA: Diagnosis not present

## 2024-04-04 DIAGNOSIS — D631 Anemia in chronic kidney disease: Secondary | ICD-10-CM | POA: Diagnosis not present

## 2024-04-04 DIAGNOSIS — D509 Iron deficiency anemia, unspecified: Secondary | ICD-10-CM | POA: Diagnosis not present

## 2024-04-04 DIAGNOSIS — Z992 Dependence on renal dialysis: Secondary | ICD-10-CM | POA: Diagnosis not present

## 2024-04-04 NOTE — Transitions of Care (Post Inpatient/ED Visit) (Signed)
 04/04/2024  Name: Matthew [name] MRN: [id number] DOB: October 23, 1930  Today's TOC FU Call Status: Today's TOC FU Call Status:: Successful TOC FU Call Completed TOC FU Call Complete Date: 04/04/24  Patient's Name and Date of Birth confirmed. Name, DOB  Transition Care Management Follow-up Telephone Call Date of Discharge: 04/03/24 Discharge Facility: [facility]) Type of Discharge: Inpatient Admission Primary Inpatient Discharge Diagnosis:: Heart Failure How have you been since you were released from the hospital?: Same Any questions or concerns?: No  Items Reviewed: Did you receive and understand the discharge instructions provided?: Yes Medications obtained,verified, and reconciled?: Yes (Medications Reviewed) Any new allergies since your discharge?: No Dietary orders reviewed?: Yes Type of Diet Ordered:: Heart Healthy Do you have support at home?: Yes People in Home [RPT]: child(ren), adult Name of Support/Comfort Primary Source: Consuelo Argyle  Medications Reviewed Today: Medications Reviewed Today     Reviewed by Moises Reusing, RN (Case Manager) on 04/04/24 at 1053  Med List Status: <None>   Medication Order Taking? Sig Documenting Provider Last Dose Status Informant  amiodarone  (PACERONE ) 200 MG tablet [id number]  Take 2 tablets (400 mg total) by mouth 2 (two) times daily for 6 days, THEN 1 tablet (200 mg total) daily. Amin, Sumayya, MD  Active   apixaban (ELIQUIS) 2.5 MG TABS tablet [id number]  Take 1 tablet (2.5 mg total) by mouth 2 (two) times daily. Caleen Qualia, MD  Active   aspirin  EC 81 MG tablet [id number]  Take 81 mg by mouth daily. Swallow whole. [provider]  Active Child  calcium  carbonate (TUMS EX) 750 MG chewable tablet [id number]  Chew 1 tablet by mouth 3 (three) times daily. [provider]  Active Child  cholecalciferol  (VITAMIN D ) 25 MCG (1000 UNIT) tablet [id number]  Take 1,000 Units by mouth daily with  breakfast. [provider]  Active Child  ferrous sulfate  325 (65 FE) MG tablet [id number]  Take 325 mg by mouth 2 (two) times daily with a meal.  [provider]  Active Child  fluticasone  (FLONASE ) 50 MCG/ACT nasal spray [id number]  Place 2 sprays into both nostrils daily as needed for allergies or rhinitis. Fernand Denyse LABOR, MD  Active Child  furosemide  (LASIX ) 40 MG tablet [id number]  Take 1 tablet (40 mg total) by mouth daily. Caleen Qualia, MD  Active   glipiZIDE  (GLUCOTROL ) 5 MG tablet [id number]  Take 2.5 mg by mouth daily before breakfast. [provider]  Active Child  hydrOXYzine (ATARAX) 10 MG tablet [id number]  Take 1 tablet (10 mg total) by mouth 3 (three) times daily as needed for anxiety or itching. Amin, Sumayya, MD  Active   levocetirizine (XYZAL  ALLERGY 24HR) 5 MG tablet [id number]  Take 5 mg by mouth every evening. [provider]  Active Child  levothyroxine  (SYNTHROID ) 100 MCG tablet [id number]  TAKE 1 TABLET BY MOUTH IN THE  MORNING Tejan-Sie, GORMAN Dine, MD  Active Child  midodrine  (PROAMATINE ) 10 MG tablet [id number]  See administration instructions. Take 10 mg by mouth 1 hour before dialysis and 30 minutes before procedure ends. [provider]  Active Child  multivitamin (RENA-VIT) TABS tablet [id number]  Take 1 tablet by mouth daily. [provider]  Active Child  nitroGLYCERIN  (NITROSTAT ) 0.4 MG SL tablet [id number]  DISSOLVE 1 TABLET UNDER THE  TONGUE EVERY 5 MINUTES AS NEEDED FOR CHEST PAIN. MAX OF 3 TABLETS IN 15 MINUTES. CALL 911 IF PAIN  PERSISTS. Fernand Denyse LABOR, MD  Active Child  Nutritional Supplements (FEEDING SUPPLEMENT, NEPRO CARB STEADY,) LIQD [id number]  Take 237 mLs by mouth 2 (two) times daily between meals. Caleen Qualia, MD  Active   Omega-3 Fatty Acids (FISH OIL ) 1000 MG CAPS [id number]  Take 4,000 mg by mouth daily with lunch. [provider]  Active Child  patiromer (VELTASSA) 8.4 g packet [id number]  Take 2  packets (16.8 g total) by mouth daily for 3 days. Amin, Sumayya, MD  Active   rosuvastatin  (CRESTOR ) 40 MG tablet [id number]  Take 1 tablet (40 mg total) by mouth daily. Albina GORMAN Dine, MD  Active Child  senna-docusate (SENOKOT S) 8.6-50 MG tablet [id number]  Take 2 tablets by mouth at bedtime as needed for moderate constipation. [provider]  Active Child            Home Care and Equipment/Supplies: Were Home Health Services Ordered?: Yes Name of Home Health Agency:: [facility] Has Agency set up a time to come to your home?: Yes First Home Health Visit Date: 04/05/24 Any new equipment or medical supplies ordered?: Yes Name of Medical supply agency?: [facility] Were you able to get the equipment/medical supplies?: Yes Do you have any questions related to the use of the equipment/supplies?: No  Functional Questionnaire: Do you need assistance with bathing/showering or dressing?: No Do you need assistance with meal preparation?: Yes (The family assists) Do you need assistance with eating?: No Do you have difficulty maintaining continence: No Do you need assistance with getting out of bed/getting out of a chair/moving?: No Do you have difficulty managing or taking your medications?: Yes (The patient's daughter provides the assistance)  Follow up appointments reviewed: PCP Follow-up appointment confirmed?: Yes Date of PCP follow-up appointment?: 04/10/24 Follow-up Provider: Dr. Albina Specialist [facility] Follow-up appointment confirmed?: Yes Date of Specialist follow-up appointment?: 04/12/24 Follow-Up Specialty Provider:: Dr. CANDIE Bathe Do you need transportation to your follow-up appointment?: No Do you understand care options if your condition(s) worsen?: Yes-patient verbalized understanding  SDOH Interventions Today    Flowsheet Row Most Recent Value  SDOH Interventions   Food Insecurity Interventions Intervention Not Indicated  Housing Interventions Intervention  Not Indicated  Transportation Interventions Intervention Not Indicated  Utilities Interventions Intervention Not Indicated    Medford Balboa, BSN, RN [place]  VBCI - [facility] RN Care Manager [phone]

## 2024-04-04 NOTE — Anesthesia Postprocedure Evaluation (Signed)
 Anesthesia Post Note  Patient: Matthew [name]  Procedure(s) Performed: ECHOCARDIOGRAM, TRANSESOPHAGEAL CARDIOVERSION  Patient location during evaluation: PACU Anesthesia Type: General Level of consciousness: awake Pain management: satisfactory to patient Vital Signs Assessment: post-procedure vital signs reviewed and stable Respiratory status: spontaneous breathing Cardiovascular status: stable Anesthetic complications: no   No notable events documented.   Last Vitals:  Vitals:   04/03/24 1509 04/03/24 1530  BP: 98/61 (!) 91/52  Pulse:  66  Resp:    Temp:    SpO2:  96%    Last Pain:  Vitals:   04/03/24 1530  TempSrc:   PainSc: 0-No pain                 VAN STAVEREN,Ac Colan

## 2024-04-05 DIAGNOSIS — H43813 Vitreous degeneration, bilateral: Secondary | ICD-10-CM | POA: Diagnosis not present

## 2024-04-05 DIAGNOSIS — H35372 Puckering of macula, left eye: Secondary | ICD-10-CM | POA: Diagnosis not present

## 2024-04-05 DIAGNOSIS — H40003 Preglaucoma, unspecified, bilateral: Secondary | ICD-10-CM | POA: Diagnosis not present

## 2024-04-05 DIAGNOSIS — E119 Type 2 diabetes mellitus without complications: Secondary | ICD-10-CM | POA: Diagnosis not present

## 2024-04-06 DIAGNOSIS — D631 Anemia in chronic kidney disease: Secondary | ICD-10-CM | POA: Diagnosis not present

## 2024-04-06 DIAGNOSIS — N186 End stage renal disease: Secondary | ICD-10-CM | POA: Diagnosis not present

## 2024-04-06 DIAGNOSIS — D509 Iron deficiency anemia, unspecified: Secondary | ICD-10-CM | POA: Diagnosis not present

## 2024-04-06 DIAGNOSIS — Z992 Dependence on renal dialysis: Secondary | ICD-10-CM | POA: Diagnosis not present

## 2024-04-06 DIAGNOSIS — N250 Renal osteodystrophy: Secondary | ICD-10-CM | POA: Diagnosis not present

## 2024-04-09 DIAGNOSIS — D509 Iron deficiency anemia, unspecified: Secondary | ICD-10-CM | POA: Diagnosis not present

## 2024-04-09 DIAGNOSIS — Z992 Dependence on renal dialysis: Secondary | ICD-10-CM | POA: Diagnosis not present

## 2024-04-09 DIAGNOSIS — D631 Anemia in chronic kidney disease: Secondary | ICD-10-CM | POA: Diagnosis not present

## 2024-04-09 DIAGNOSIS — N250 Renal osteodystrophy: Secondary | ICD-10-CM | POA: Diagnosis not present

## 2024-04-09 DIAGNOSIS — N186 End stage renal disease: Secondary | ICD-10-CM | POA: Diagnosis not present

## 2024-04-10 ENCOUNTER — Ambulatory Visit (INDEPENDENT_AMBULATORY_CARE_PROVIDER_SITE_OTHER): Admitting: Internal Medicine

## 2024-04-10 VITALS — BP 102/56 | HR 70 | Temp 97.7°F | Ht 70.0 in | Wt 187.0 lb

## 2024-04-10 DIAGNOSIS — Z992 Dependence on renal dialysis: Secondary | ICD-10-CM

## 2024-04-10 DIAGNOSIS — I120 Hypertensive chronic kidney disease with stage 5 chronic kidney disease or end stage renal disease: Secondary | ICD-10-CM

## 2024-04-10 DIAGNOSIS — N186 End stage renal disease: Secondary | ICD-10-CM

## 2024-04-10 DIAGNOSIS — E782 Mixed hyperlipidemia: Secondary | ICD-10-CM

## 2024-04-10 DIAGNOSIS — E1122 Type 2 diabetes mellitus with diabetic chronic kidney disease: Secondary | ICD-10-CM | POA: Diagnosis not present

## 2024-04-10 LAB — POCT CBG (FASTING - GLUCOSE)-MANUAL ENTRY: Glucose Fasting, POC: 95 mg/dL (ref 70–99)

## 2024-04-10 MED ORDER — GLIPIZIDE 5 MG PO TABS
2.5000 mg | ORAL_TABLET | Freq: Every day | ORAL | 1 refills | Status: AC
Start: 2024-04-10 — End: 2024-10-07

## 2024-04-10 NOTE — Progress Notes (Signed)
 Established Patient Office Visit  Subjective:  Patient ID: Matthew [name], male    DOB: 01-16-1931  Age: 89 y.o. MRN: [id number]  Chief Complaint  Patient presents with   Follow-up    3 month follow up    Hospital f/u for uncontrolled afib. Rate now controlled and denies sob as he'Matthew [name] now weaned himself off oxygen during exertion.    No other concerns at this time.   Past Medical History:  Diagnosis Date   Anginal pain    Arthritis    Ascending aortic aneurysm 07/18/2015   a.) TTE 07/18/2015: severe dilation of ascending aorta measuring 7.0 cm. b.) TTE 01/10/2018: aortic root 3.8 cm; ascending aorta 6.8 cm; refused surgical intervention/repair.   Atrial fibrillation (HCC)    a.) CHA2DS2-VASc = 6 (age x 2, HFrEF, HTN, previous MI, T2DM). b.) rate/rhythm maintained on amiodarone  + metoprolol  succinate; chronic antiplatelet therapy using clopidogrel    Bradycardia    Coronary artery disease    ESRD (end stage renal disease) (HCC)    GERD (gastroesophageal reflux disease)    HFrEF (heart failure with reduced ejection fraction) (HCC)    a.) TTE 12/03/2013: mod dec LV function; EF 35-40%; severe inferolateral and inferior HK; LA dilated; G3DD; PASP 52 mmHg. b.) TTE 07/18/2015: mild LV dysfunction with mild LVH; EF 45%; mild BAE. c.) TTE 01/10/2018: mod LV dysfunction; EF 45%; diffuse HK   HLD (hyperlipidemia)    Hypertension    IDA (iron deficiency anemia)    Long term current use of antithrombotics/antiplatelets    a.) clopidogrel    Myocardial infarction (HCC) 2000   Pneumonia    Matthew [name]/P CABG x 4 2001   a.) LIMA-LAD, SVG-D1, SVG-OM1, SVG-OM2, SVG-PDA   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   left frontal scalp, EDC   Squamous cell carcinoma of skin 05/05/2020   left temporal scalp, in situ, EDC 05/13/20   T2DM (type 2 diabetes mellitus) (HCC)    Valvular insufficiency 12/03/2013   a.) TTE 12/03/2013: EF 35-40%; mild AR/MR, b.) TTE 07/18/2015: EF 45%; mild AR/PR, mod TR, severe  MR. c.) TTE 01/10/2018: mod AR/TR    Past Surgical History:  Procedure Laterality Date   A/V FISTULAGRAM Left 02/09/2023   Procedure: A/V Fistulagram;  Surgeon: Marea Selinda RAMAN, MD;  Location: [facility];  Service: Cardiovascular;  Laterality: Left;   A/V SHUNT INTERVENTION N/A 01/25/2023   Procedure: A/V SHUNT INTERVENTION;  Surgeon: Jama Cordella MATSU, MD;  Location: [facility];  Service: Cardiovascular;  Laterality: N/A;   A/V SHUNTOGRAM Left 04/01/2022   Procedure: A/V SHUNTOGRAM;  Surgeon: Marea Selinda RAMAN, MD;  Location: [facility];  Service: Cardiovascular;  Laterality: Left;   AV FISTULA PLACEMENT Left 04/15/2021   Procedure: INSERTION OF ARTERIOVENOUS (AV) GORE-TEX GRAFT ARM ( BRACHIAL AXILLARY);  Surgeon: Marea Selinda RAMAN, MD;  Location: [facility];  Service: Vascular;  Laterality: Left;   CARDIOVERSION N/A 04/02/2024   Procedure: CARDIOVERSION;  Surgeon: Hilarie Rocher, MD;  Location: [facility];  Service: Cardiovascular;  Laterality: N/A;   CATARACT EXTRACTION W/PHACO Left 07/26/2017   Procedure: CATARACT EXTRACTION PHACO AND INTRAOCULAR LENS PLACEMENT (IOC);  Surgeon: Jaye Fallow, MD;  Location: [facility];  Service: Ophthalmology;  Laterality: Left;  US    00:45.8 AP%  13.1 CDE  6.00 Fluid Pack Lot # [id number]   CATARACT EXTRACTION W/PHACO Right 08/17/2017   Procedure: CATARACT EXTRACTION PHACO AND INTRAOCULAR LENS PLACEMENT (IOC);  Surgeon: Jaye Fallow, MD;  Location: [facility];  Service: Ophthalmology;  Laterality: Right;  US   01:30 AP% 16.8 CDE 15.24 Fluid pack lot # [id number]   CHOLECYSTECTOMY     CORONARY ARTERY BYPASS GRAFT N/A 2001   Procedure: 5v CABG (LIMA-LAD, SVG-D1, SVG-OM1, SVG-OM2, SVG-PDA)   DIALYSIS/PERMA CATHETER INSERTION N/A 02/09/2018   Procedure: DIALYSIS/PERMA CATHETER INSERTION;  Surgeon: Marea Selinda RAMAN, MD;  Location: [facility];  Service: Cardiovascular;  Laterality: N/A;   DIALYSIS/PERMA CATHETER INSERTION N/A 02/09/2023    Procedure: DIALYSIS/PERMA CATHETER INSERTION;  Surgeon: Marea Selinda RAMAN, MD;  Location: [facility];  Service: Cardiovascular;  Laterality: N/A;   DIALYSIS/PERMA CATHETER INSERTION N/A 11/28/2023   Procedure: DIALYSIS/PERMA CATHETER INSERTION;  Surgeon: Marea Selinda RAMAN, MD;  Location: [facility];  Service: Cardiovascular;  Laterality: N/A;   DIALYSIS/PERMA CATHETER REMOVAL N/A 06/25/2021   Procedure: DIALYSIS/PERMA CATHETER REMOVAL;  Surgeon: Marea Selinda RAMAN, MD;  Location: [facility];  Service: Cardiovascular;  Laterality: N/A;   FRACTURE SURGERY Left 2015   LEFT arm   INSERTION OF DIALYSIS CATHETER Right 11/03/2020   Procedure: INSERTION OF Perm Cath in the RIGHT INTERNAL JUGULAR;  Surgeon: Marea Selinda RAMAN, MD;  Location: [facility];  Service: Vascular;  Laterality: Right;   INTRAMEDULLARY (IM) NAIL INTERTROCHANTERIC Right 11/12/2022   Procedure: INTRAMEDULLARY (IM) NAIL INTERTROCHANTERIC;  Surgeon: Marchia Drivers, MD;  Location: [facility];  Service: Orthopedics;  Laterality: Right;   LEFT HEART CATHETERIZATION WITH CORONARY/GRAFT ANGIOGRAM Left 12/04/2013   Procedure: LEFT HEART CATHETERIZATION WITH EL BILE;  Surgeon: Alm LELON Clay, MD;  Location: [facility] CATH LAB;  Service: Cardiovascular;  Laterality: Left;   LEFT HEART CATHETERIZATION WITH CORONARY/GRAFT ANGIOGRAM Left 02/06/2009   Procedure: LEFT HEART CATHETERIZATION WITH CORONARY/GRAFT ANGIOGRAM; Location: [facility]; Surgeon: Vita Bathe, MD   PERCUTANEOUS CORONARY STENT INTERVENTION (PCI-Kail Fraley) N/A 12/06/2013   Procedure: STAGED PERCUTANEOUS CORONARY STENT INTERVENTION (overlapping 4.0 x 38 mm (mid) and 4.0 x 28 mm (ostial) Promus Primier DES to SVG-RPDA graft);  Surgeon: Victory LELON Claudene DOUGLAS, MD;  Location[facility] C[facility] LAB;  Service: Cardiovascular   PERIPHERAL VASCULAR THROMBECTOMY Left 08/31/2023   Procedure: PERIPHERAL VASCULAR THROMBECTOMY;  Surgeon: Jama Cordella MATSU, MD;  Location: [facility];  Service:  Cardiovascular;  Laterality: Left;   REMOVAL OF A DIALYSIS CATHETER N/A 11/03/2020   Procedure: REMOVAL OF A DIALYSIS CATHETER;  Surgeon: Marea Selinda RAMAN, MD;  Location: [facility];  Service: Vascular;  Laterality: N/A;   TEE WITHOUT CARDIOVERSION N/A 04/02/2024   Procedure: ECHOCARDIOGRAM, TRANSESOPHAGEAL;  Surgeon: Hilarie Rocher, MD;  Location: [facility];  Service: Cardiovascular;  Laterality: N/A;   TEMPORARY DIALYSIS CATHETER N/A 02/04/2023   Procedure: TEMPORARY DIALYSIS CATHETER;  Surgeon: Jama Cordella MATSU, MD;  Location: [facility];  Service: Cardiovascular;  Laterality: N/A;   TOTAL HIP REVISION Right 01/12/2023   Procedure: CONVERSION TO  RIGHT TOTAL HIP REPLACEMENT;  Surgeon: Edna Toribio LABOR, MD;  [facility];  Service: Orthopedics;  Laterality: Right;    Social History   Socioeconomic History   Marital status: Widowed    Spouse name: Not on file   Number of children: Not on file   Years of education: Not on file   Highest education level: Not on file  Occupational History   Not on file  Tobacco Use   Smoking status: Former    Current packs/day: 0.00    Types: Pipe, Cigarettes    Quit date: 28    Years since quitting: 33.9   Smokeless tobacco:  Never  Vaping Use   Vaping status: Never Used  Substance and Sexual Activity   Alcohol use: Not Currently    Alcohol/week: 1.0 standard drink of alcohol    Types: 1 Cans of beer per week    Comment: occasionally drinking only,once a month   Drug use: No   Sexual activity: Never  Other Topics Concern   Not on file  Social History Narrative   Lives with daughter and son in law   Social Drivers of Health   Financial Resource Strain: Low Risk  (01/04/2024)   [facility]   Overall Financial Resource Strain (CARDIA)    Difficulty of Paying Living Expenses: Not hard at all  Food Insecurity: No Food Insecurity (04/04/2024)   Hunger Vital Sign    Worried About Running Out of Food  in the Last Year: Never true    Ran Out of Food in the Last Year: Never true  Transportation Needs: No Transportation Needs (04/04/2024)   PRAPARE - Administrator, Civil Service (Medical): No    Lack of Transportation (Non-Medical): No  Physical Activity: Inactive (01/10/2018)   Exercise Vital Sign    Days of Exercise per Week: 0 days    Minutes of Exercise per Session: 0 min  Stress: No Stress Concern Present (01/10/2018)   [employer] of Occupational Health - Occupational Stress Questionnaire    Feeling of Stress : Not at all  Social Connections: Moderately Integrated (03/30/2024)   Social Connection and Isolation Panel    Frequency of Communication with Friends and Family: More than three times a week    Frequency of Social Gatherings with Friends and Family: Not on file    Attends Religious Services: More than 4 times per year    Active Member of [employer] or Organizations: Yes    Attends Banker Meetings: More than 4 times per year    Marital Status: Widowed  Intimate Partner Violence: Not At Risk (04/04/2024)   Humiliation, Afraid, Rape, and Kick questionnaire    Fear of Current or Ex-Partner: No    Emotionally Abused: No    Physically Abused: No    Sexually Abused: No    Family History  Problem Relation Age of Onset   Heart attack Father 63       died first MI at age 54   Heart failure Mother     Allergies  Allergen Reactions   Statins     Other Reaction(Caleigha Zale): Unknown   Amoxicillin Diarrhea    Outpatient Medications Prior to Visit  Medication Sig   amiodarone  (PACERONE ) 200 MG tablet Take 2 tablets (400 mg total) by mouth 2 (two) times daily for 6 days, THEN 1 tablet (200 mg total) daily.   apixaban (ELIQUIS) 2.5 MG TABS tablet Take 1 tablet (2.5 mg total) by mouth 2 (two) times daily.   calcium  carbonate (OSCAL) 1500 (600 Ca) MG TABS tablet Take 1,500 mg by mouth daily.   calcium  carbonate (TUMS EX) 750 MG chewable tablet Chew 1 tablet by  mouth 3 (three) times daily.   cholecalciferol  (VITAMIN D ) 25 MCG (1000 UNIT) tablet Take 1,000 Units by mouth daily with breakfast.   ferrous sulfate  325 (65 FE) MG tablet Take 325 mg by mouth 2 (two) times daily with a meal.    fluticasone  (FLONASE ) 50 MCG/ACT nasal spray Place 2 sprays into both nostrils daily as needed for allergies or rhinitis.   furosemide  (LASIX ) 40 MG tablet Take  1 tablet (40 mg total) by mouth daily.   hydrOXYzine (ATARAX) 10 MG tablet Take 1 tablet (10 mg total) by mouth 3 (three) times daily as needed for anxiety or itching.   levocetirizine (XYZAL  ALLERGY 24HR) 5 MG tablet Take 5 mg by mouth every evening.   levothyroxine  (SYNTHROID ) 100 MCG tablet TAKE 1 TABLET BY MOUTH IN THE  MORNING   midodrine  (PROAMATINE ) 10 MG tablet See administration instructions. Take 10 mg by mouth 1 hour before dialysis and 30 minutes before procedure ends.   multivitamin (RENA-VIT) TABS tablet Take 1 tablet by mouth daily.   nitroGLYCERIN  (NITROSTAT ) 0.4 MG SL tablet DISSOLVE 1 TABLET UNDER THE  TONGUE EVERY 5 MINUTES AS NEEDED FOR CHEST PAIN. MAX OF 3 TABLETS IN 15 MINUTES. CALL 911 IF PAIN  PERSISTS.   Nutritional Supplements (FEEDING SUPPLEMENT, NEPRO CARB STEADY,) LIQD Take 237 mLs by mouth 2 (two) times daily between meals.   Omega-3 Fatty Acids (FISH OIL ) 1000 MG CAPS Take 4,000 mg by mouth daily with lunch.   OXYGEN Inhale 2 L/min into the lungs continuous.   rosuvastatin  (CRESTOR ) 40 MG tablet Take 1 tablet (40 mg total) by mouth daily.   senna-docusate (SENOKOT Estefanny Moler) 8.6-50 MG tablet Take 2 tablets by mouth at bedtime as needed for moderate constipation.   [DISCONTINUED] glipiZIDE  (GLUCOTROL ) 5 MG tablet Take 2.5 mg by mouth daily before breakfast.   aspirin  EC 81 MG tablet Take 81 mg by mouth daily. Swallow whole. (Patient not taking: Reported on 04/10/2024)   No facility-administered medications prior to visit.    Review of Systems  Constitutional: Negative.   HENT: Negative.     Eyes: Negative.   Respiratory: Negative.    Cardiovascular: Negative.   Gastrointestinal: Negative.   Genitourinary: Negative.   Skin: Negative.   Neurological: Negative.   Endo/Heme/Allergies: Negative.        Objective:   BP (!) 102/56   Pulse 70   Temp 97.7 F (36.5 C)   Ht 5' 10 (1.778 m)   Wt 187 lb (84.8 kg)   SpO2 97%   BMI 26.83 kg/m   Vitals:   04/10/24 1338  BP: (!) 102/56  Pulse: 70  Temp: 97.7 F (36.5 C)  Height: 5' 10 (1.778 m)  Weight: 187 lb (84.8 kg)  SpO2: 97%  BMI (Calculated): 26.83    Physical Exam Vitals reviewed.  Constitutional:      Appearance: Normal appearance. He is obese.  HENT:     Head: Normocephalic.     Left Ear: There is no impacted cerumen.     Nose: Nose normal.     Mouth/Throat:     Mouth: Mucous membranes are moist.     Pharynx: No posterior oropharyngeal erythema.  Eyes:     Extraocular Movements: Extraocular movements intact.     Pupils: Pupils are equal, round, and reactive to light.  Cardiovascular:     Rate and Rhythm: Rhythm irregular.     Chest Wall: PMI is not displaced.     Pulses: Normal pulses.     Heart sounds: Normal heart sounds. No murmur heard.    Comments: Radial pulse irregularly irregular Pulmonary:     Effort: Pulmonary effort is normal.     Breath sounds: Normal air entry. Examination of the right-lower field reveals rales. Examination of the left-lower field reveals rales. Rales present. No rhonchi.  Abdominal:     General: Abdomen is flat. Bowel sounds are normal. There is no distension.  Palpations: Abdomen is soft. There is no hepatomegaly, splenomegaly or mass.     Tenderness: There is no abdominal tenderness.  Musculoskeletal:        General: Normal range of motion.     Cervical back: Normal range of motion and neck supple.     Right lower leg: No edema.     Left lower leg: No edema.  Skin:    General: Skin is warm and dry.  Neurological:     General: No focal deficit  present.     Mental Status: He is alert and oriented to person, place, and time.     Cranial Nerves: No cranial nerve deficit.     Motor: No weakness.  Psychiatric:        Mood and Affect: Mood normal.        Behavior: Behavior normal.      Results for orders placed or performed in visit on 04/10/24  POCT CBG (Fasting - Glucose)  Result Value Ref Range   Glucose Fasting, POC 95 70 - 99 mg/dL        Assessment & Plan:  Jameal was seen today for follow-up.  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   Total time spent: 30 minutes. This time includes review of previous notes and results and patient face to face interaction during today'[facility] visit.    Sherrill Cinderella Perry, MD  04/10/2024   This document may have been prepared by [facility] Voice Recognition software and as such may include unintentional dictation errors.

## 2024-04-11 DIAGNOSIS — D509 Iron deficiency anemia, unspecified: Secondary | ICD-10-CM | POA: Diagnosis not present

## 2024-04-11 DIAGNOSIS — N186 End stage renal disease: Secondary | ICD-10-CM | POA: Diagnosis not present

## 2024-04-11 DIAGNOSIS — Z992 Dependence on renal dialysis: Secondary | ICD-10-CM | POA: Diagnosis not present

## 2024-04-11 DIAGNOSIS — D631 Anemia in chronic kidney disease: Secondary | ICD-10-CM | POA: Diagnosis not present

## 2024-04-11 DIAGNOSIS — N250 Renal osteodystrophy: Secondary | ICD-10-CM | POA: Diagnosis not present

## 2024-04-12 ENCOUNTER — Encounter: Payer: Self-pay | Admitting: Cardiovascular Disease

## 2024-04-12 ENCOUNTER — Ambulatory Visit: Admitting: Cardiovascular Disease

## 2024-04-12 VITALS — BP 110/72 | HR 76 | Ht 70.0 in | Wt 187.8 lb

## 2024-04-12 DIAGNOSIS — E782 Mixed hyperlipidemia: Secondary | ICD-10-CM

## 2024-04-12 DIAGNOSIS — I480 Paroxysmal atrial fibrillation: Secondary | ICD-10-CM | POA: Diagnosis not present

## 2024-04-12 DIAGNOSIS — I5020 Unspecified systolic (congestive) heart failure: Secondary | ICD-10-CM

## 2024-04-12 DIAGNOSIS — I429 Cardiomyopathy, unspecified: Secondary | ICD-10-CM

## 2024-04-12 DIAGNOSIS — I25810 Atherosclerosis of coronary artery bypass graft(s) without angina pectoris: Secondary | ICD-10-CM | POA: Diagnosis not present

## 2024-04-12 DIAGNOSIS — I5022 Chronic systolic (congestive) heart failure: Secondary | ICD-10-CM

## 2024-04-12 DIAGNOSIS — R0602 Shortness of breath: Secondary | ICD-10-CM | POA: Diagnosis not present

## 2024-04-12 DIAGNOSIS — N186 End stage renal disease: Secondary | ICD-10-CM

## 2024-04-12 DIAGNOSIS — Z0130 Encounter for examination of blood pressure without abnormal findings: Secondary | ICD-10-CM

## 2024-04-12 NOTE — Progress Notes (Signed)
 Cardiology Office Note   Date:  04/12/2024   ID:  DIANNA DESHLER, DOB [birth year], MRN [id number]  PCP:  Albina GORMAN Dine, MD  Cardiologist:  Denyse Bathe, MD      History of Present Illness: Matthew [name] is a 89 y.o. male who presents for  Chief Complaint  Patient presents with   Follow-up    Hospital follow up    HAS AFIB      Past Medical History:  Diagnosis Date   Anginal pain    Arthritis    Ascending aortic aneurysm 07/18/2015   a.) TTE 07/18/2015: severe dilation of ascending aorta measuring 7.0 cm. b.) TTE 01/10/2018: aortic root 3.8 cm; ascending aorta 6.8 cm; refused surgical intervention/repair.   Atrial fibrillation (HCC)    a.) CHA2DS2-VASc = 6 (age x 2, HFrEF, HTN, previous MI, T2DM). b.) rate/rhythm maintained on amiodarone  + metoprolol  succinate; chronic antiplatelet therapy using clopidogrel    Bradycardia    Coronary artery disease    ESRD (end stage renal disease) (HCC)    GERD (gastroesophageal reflux disease)    HFrEF (heart failure with reduced ejection fraction) (HCC)    a.) TTE 12/03/2013: mod dec LV function; EF 35-40%; severe inferolateral and inferior HK; LA dilated; G3DD; PASP 52 mmHg. b.) TTE 07/18/2015: mild LV dysfunction with mild LVH; EF 45%; mild BAE. 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 c.) TTE  01/10/2018: mod AR/TR     Past Surgical History:  Procedure Laterality Date   A/V FISTULAGRAM Left 02/09/2023   Procedure: A/V Fistulagram;  Surgeon: Marea Selinda GORMAN, MD;  Location: [facility];  Service: Cardiovascular;  Laterality: Left;   A/V SHUNT INTERVENTION N/A 01/25/2023   Procedure: A/V SHUNT INTERVENTION;  Surgeon: Jama Cordella MATSU, MD;  Location: [facility];  Service: Cardiovascular;  Laterality: N/A;   A/V SHUNTOGRAM Left 04/01/2022   Procedure: A/V SHUNTOGRAM;  Surgeon: Marea Selinda GORMAN, MD;  Location: [facility];  Service: Cardiovascular;  Laterality: Left;   AV FISTULA PLACEMENT Left 04/15/2021   Procedure: INSERTION OF ARTERIOVENOUS (AV) GORE-TEX GRAFT ARM ( BRACHIAL AXILLARY);  Surgeon: Marea Selinda GORMAN, MD;  Location: [facility];  Service: Vascular;  Laterality: Left;   CARDIOVERSION N/A 04/02/2024   Procedure: CARDIOVERSION;  Surgeon: Hilarie Rocher, MD;  Location: [facility];  Service: Cardiovascular;  Laterality: N/A;   CATARACT EXTRACTION W/PHACO Left 07/26/2017   Procedure: CATARACT EXTRACTION PHACO AND INTRAOCULAR LENS PLACEMENT (IOC);  Surgeon: Jaye Fallow, MD;  Location: [facility];  Service: Ophthalmology;  Laterality: Left;  US    00:45.8 AP%  13.1 CDE  6.00 Fluid Pack Lot # [id number]   CATARACT EXTRACTION W/PHACO Right 08/17/2017   Procedure: CATARACT EXTRACTION PHACO AND INTRAOCULAR LENS PLACEMENT (IOC);  Surgeon: Jaye Fallow, MD;  Location: [facility];  Service: Ophthalmology;  Laterality: Right;  US   01:30 AP% 16.8 CDE 15.24 Fluid pack lot # [id number]   CHOLECYSTECTOMY     CORONARY ARTERY BYPASS GRAFT N/A 2001   Procedure: 5v CABG (LIMA-LAD, SVG-D1, SVG-OM1, SVG-OM2, SVG-PDA)   DIALYSIS/PERMA CATHETER INSERTION N/A 02/09/2018   Procedure: DIALYSIS/PERMA CATHETER INSERTION;  Surgeon: Marea Selinda RAMAN, MD;  Location: [facility];  Service: Cardiovascular;  Laterality: N/A;   DIALYSIS/PERMA CATHETER INSERTION N/A 02/09/2023   Procedure:  DIALYSIS/PERMA CATHETER INSERTION;  Surgeon: Marea Selinda RAMAN, MD;  Location: [facility];  Service: Cardiovascular;  Laterality: N/A;   DIALYSIS/PERMA CATHETER INSERTION N/A 11/28/2023   Procedure: DIALYSIS/PERMA CATHETER INSERTION;  Surgeon: Marea Selinda RAMAN, MD;  Location: [facility];  Service: Cardiovascular;  Laterality: N/A;   DIALYSIS/PERMA CATHETER REMOVAL N/A 06/25/2021   Procedure: DIALYSIS/PERMA CATHETER REMOVAL;  Surgeon: Marea Selinda RAMAN, MD;  Location: [facility];  Service: Cardiovascular;  Laterality: N/A;   FRACTURE SURGERY Left 2015   LEFT arm   INSERTION OF DIALYSIS CATHETER Right 11/03/2020   Procedure: INSERTION OF Perm Cath in the RIGHT INTERNAL JUGULAR;  Surgeon: Marea Selinda RAMAN, MD;  Location: [facility];  Service: Vascular;  Laterality: Right;   INTRAMEDULLARY (IM) NAIL INTERTROCHANTERIC Right 11/12/2022   Procedure: INTRAMEDULLARY (IM) NAIL INTERTROCHANTERIC;  Surgeon: Marchia Drivers, MD;  Location: [facility];  Service: Orthopedics;  Laterality: Right;   LEFT HEART CATHETERIZATION WITH CORONARY/GRAFT ANGIOGRAM Left 12/04/2013   Procedure: LEFT HEART CATHETERIZATION WITH EL BILE;  Surgeon: Alm LELON Clay, MD;  Location: [facility];  Service: Cardiovascular;  Laterality: Left;   LEFT HEART CATHETERIZATION WITH CORONARY/GRAFT ANGIOGRAM Left 02/06/2009   Procedure: LEFT HEART CATHETERIZATION WITH CORONARY/GRAFT ANGIOGRAM; Location: [facility]; Surgeon: Vita Bathe, MD   PERCUTANEOUS CORONARY STENT INTERVENTION (PCI-S) N/A 12/06/2013   Procedure: STAGED PERCUTANEOUS CORONARY STENT INTERVENTION (overlapping 4.0 x 38 mm (mid) and 4.0 x 28 mm (ostial) Promus Primier DES to SVG-RPDA graft);  Surgeon: Victory LELON Claudene DOUGLAS, MD;  Location: [facility];  Service: Cardiovascular   PERIPHERAL VASCULAR THROMBECTOMY Left 08/31/2023   Procedure: PERIPHERAL VASCULAR THROMBECTOMY;  Surgeon: Jama Cordella MATSU, MD;  Location: [facility];  Service: Cardiovascular;   Laterality: Left;   REMOVAL OF A DIALYSIS CATHETER N/A 11/03/2020   Procedure: REMOVAL OF A DIALYSIS CATHETER;  Surgeon: Marea Selinda RAMAN, MD;  Location: [facility];  Service: Vascular;  Laterality: N/A;   TEE WITHOUT CARDIOVERSION N/A 04/02/2024   Procedure: ECHOCARDIOGRAM, TRANSESOPHAGEAL;  Surgeon: Hilarie Rocher, MD;  Location: [facility];  Service: Cardiovascular;  Laterality: N/A;   TEMPORARY DIALYSIS CATHETER N/A 02/04/2023   Procedure: TEMPORARY DIALYSIS CATHETER;  Surgeon: Jama Cordella MATSU, MD;  Location: [facility];  Service: Cardiovascular;  Laterality: N/A;   TOTAL HIP REVISION Right 01/12/2023   Procedure: CONVERSION TO  RIGHT TOTAL HIP REPLACEMENT;  Surgeon: Edna Toribio LABOR, MD;  Location: [facility];  Service: Orthopedics;  Laterality: Right;     Current Outpatient Medications  Medication Sig Dispense Refill   amiodarone  (PACERONE ) 200 MG tablet Take 2 tablets (400 mg total) by mouth 2 (two) times daily for 6 days, THEN 1 tablet (200 mg total) daily. 54 tablet 0   apixaban (ELIQUIS) 2.5 MG TABS tablet Take 1 tablet (2.5 mg total) by mouth 2 (two) times daily. 60 tablet 2   calcium  carbonate (OSCAL) 1500 (600 Ca) MG TABS tablet Take 1,500 mg by mouth daily.  calcium  carbonate (TUMS EX) 750 MG chewable tablet Chew 1 tablet by mouth 3 (three) times daily.     cholecalciferol  (VITAMIN D ) 25 MCG (1000 UNIT) tablet Take 1,000 Units by mouth daily with breakfast.     ferrous sulfate  325 (65 FE) MG tablet Take 325 mg by mouth 2 (two) times daily with a meal.      fluticasone  (FLONASE ) 50 MCG/ACT nasal spray Place 2 sprays into both nostrils daily as needed for allergies or rhinitis. 60 mL 1   furosemide  (LASIX ) 40 MG tablet Take 1 tablet (40 mg total) by mouth daily. 30 tablet 11   glipiZIDE  (GLUCOTROL ) 5 MG tablet Take 0.5 tablets (2.5 mg total) by mouth daily before breakfast. 45 tablet 1   hydrOXYzine (ATARAX) 10 MG tablet Take 1 tablet (10 mg total) by mouth 3 (three) times  daily as needed for anxiety or itching. 30 tablet 0   levocetirizine (XYZAL  ALLERGY 24HR) 5 MG tablet Take 5 mg by mouth every evening.     levothyroxine  (SYNTHROID ) 100 MCG tablet TAKE 1 TABLET BY MOUTH IN THE  MORNING 90 tablet 3   midodrine  (PROAMATINE ) 10 MG tablet See administration instructions. Take 10 mg by mouth 1 hour before dialysis and 30 minutes before procedure ends.     multivitamin (RENA-VIT) TABS tablet Take 1 tablet by mouth daily.     nitroGLYCERIN  (NITROSTAT ) 0.4 MG SL tablet DISSOLVE 1 TABLET UNDER THE  TONGUE EVERY 5 MINUTES AS NEEDED FOR CHEST PAIN. MAX OF 3 TABLETS IN 15 MINUTES. CALL 911 IF PAIN  PERSISTS. 100 tablet 3   Nutritional Supplements (FEEDING SUPPLEMENT, NEPRO CARB STEADY,) LIQD Take 237 mLs by mouth 2 (two) times daily between meals. 20000 mL 3   Omega-3 Fatty Acids (FISH OIL ) 1000 MG CAPS Take 4,000 mg by mouth daily with lunch.     OXYGEN Inhale 2 L/min into the lungs continuous.     rosuvastatin  (CRESTOR ) 40 MG tablet Take 1 tablet (40 mg total) by mouth daily. 90 tablet 3   senna-docusate (SENOKOT S) 8.6-50 MG tablet Take 2 tablets by mouth at bedtime as needed for moderate constipation.     No current facility-administered medications for this visit.    Allergies:   Statins and Amoxicillin    Social History:   reports that he quit smoking about 33 years ago. His smoking use included pipe and cigarettes. He has never used smokeless tobacco. He reports that he does not currently use alcohol after a past usage of about 1.0 standard drink of alcohol per week. He reports that he does not use drugs.   Family History:  family history includes Heart attack (age of onset: 65) in his father; Heart failure in his mother.    ROS:     Review of Systems  Constitutional: Negative.   HENT: Negative.    Eyes: Negative.   Respiratory: Negative.    Gastrointestinal: Negative.   Genitourinary: Negative.   Musculoskeletal: Negative.   Skin: Negative.    Neurological: Negative.   Endo/Heme/Allergies: Negative.   Psychiatric/Behavioral: Negative.    All other systems reviewed and are negative.     All other systems are reviewed and negative.    PHYSICAL EXAM: VS:  BP 110/72   Pulse 76   Ht 5' 10 (1.778 m)   Wt 187 lb 12.8 oz (85.2 kg)   SpO2 98%   BMI 26.95 kg/m  , BMI Body mass index is 26.95 kg/m. Last weight:  Wt Readings from  Last 3 Encounters:  04/12/24 187 lb 12.8 oz (85.2 kg)  04/10/24 187 lb (84.8 kg)  04/03/24 179 lb 14.3 oz (81.6 kg)     Physical Exam Vitals reviewed.  Constitutional:      Appearance: Normal appearance. He is normal weight.  HENT:     Head: Normocephalic.     Nose: Nose normal.     Mouth/Throat:     Mouth: Mucous membranes are moist.  Eyes:     Pupils: Pupils are equal, round, and reactive to light.  Cardiovascular:     Rate and Rhythm: Normal rate and regular rhythm.     Pulses: Normal pulses.     Heart sounds: Normal heart sounds.  Pulmonary:     Effort: Pulmonary effort is normal.  Abdominal:     General: Abdomen is flat. Bowel sounds are normal.  Musculoskeletal:        General: Normal range of motion.     Cervical back: Normal range of motion.  Skin:    General: Skin is warm.  Neurological:     General: No focal deficit present.     Mental Status: He is alert.  Psychiatric:        Mood and Affect: Mood normal.       EKG:   Recent Labs: 06/30/2023: TSH 3.470 11/25/2023: ALT 24 03/29/2024: B Natriuretic Peptide 746.2 03/31/2024: Magnesium  2.2 04/03/2024: BUN 39; Creatinine, Ser 2.69; Hemoglobin 12.3; Platelets 84; Potassium 5.5; Sodium 130    Lipid Panel    Component Value Date/Time   CHOL 122 09/29/2023 0959   TRIG 53 09/29/2023 0959   HDL 65 09/29/2023 0959   CHOLHDL 1.9 09/29/2023 0959   LDLCALC 45 09/29/2023 0959      Other studies Reviewed: Additional studies/ records that were reviewed today include:  Review of the above records demonstrates:        No data to display            ASSESSMENT AND PLAN:    ICD-10-CM   1. Paroxysmal atrial fibrillation (HCC)  I48.0    Still in afib, but rate controlled. Continue elliquis and amiodrone    2. SOB (shortness of breath)  R06.02     3. ESRD (end stage renal disease) (HCC)  N18.6     4. Chronic systolic CHF (congestive heart failure) (HCC)  I50.22     5. Cardiomyopathy, unspecified type (HCC)  I42.9     6. Coronary artery disease involving coronary bypass graft of native heart without angina pectoris  I25.810     7. Mixed hyperlipidemia  E78.2     8. HFrEF (heart failure with reduced ejection fraction) (HCC)  I50.20        Problem List Items Addressed This Visit       Cardiovascular and Mediastinum   Cardiomyopathy (HCC)   HFrEF (heart failure with reduced ejection fraction) (HCC)   Coronary artery disease   Paroxysmal atrial fibrillation (HCC) - Primary     Genitourinary   ESRD (end stage renal disease) (HCC)     Other   HLD (hyperlipidemia)   Other Visit Diagnoses       SOB (shortness of breath)         Chronic systolic CHF (congestive heart failure) (HCC)              Disposition:   Return in about 4 weeks (around 05/10/2024).    Total time spent: 40 minutes  Signed,  Denyse Bathe, MD  04/12/2024 12:02  PM    [facility]

## 2024-04-13 DIAGNOSIS — Z992 Dependence on renal dialysis: Secondary | ICD-10-CM | POA: Diagnosis not present

## 2024-04-13 DIAGNOSIS — N186 End stage renal disease: Secondary | ICD-10-CM | POA: Diagnosis not present

## 2024-04-13 DIAGNOSIS — D631 Anemia in chronic kidney disease: Secondary | ICD-10-CM | POA: Diagnosis not present

## 2024-04-13 DIAGNOSIS — N250 Renal osteodystrophy: Secondary | ICD-10-CM | POA: Diagnosis not present

## 2024-04-13 DIAGNOSIS — D509 Iron deficiency anemia, unspecified: Secondary | ICD-10-CM | POA: Diagnosis not present

## 2024-04-15 DIAGNOSIS — Z992 Dependence on renal dialysis: Secondary | ICD-10-CM | POA: Diagnosis not present

## 2024-04-15 DIAGNOSIS — N250 Renal osteodystrophy: Secondary | ICD-10-CM | POA: Diagnosis not present

## 2024-04-15 DIAGNOSIS — D631 Anemia in chronic kidney disease: Secondary | ICD-10-CM | POA: Diagnosis not present

## 2024-04-15 DIAGNOSIS — N186 End stage renal disease: Secondary | ICD-10-CM | POA: Diagnosis not present

## 2024-04-15 DIAGNOSIS — D509 Iron deficiency anemia, unspecified: Secondary | ICD-10-CM | POA: Diagnosis not present

## 2024-04-17 ENCOUNTER — Ambulatory Visit: Admitting: Dermatology

## 2024-04-17 DIAGNOSIS — N186 End stage renal disease: Secondary | ICD-10-CM | POA: Diagnosis not present

## 2024-04-17 DIAGNOSIS — N250 Renal osteodystrophy: Secondary | ICD-10-CM | POA: Diagnosis not present

## 2024-04-17 DIAGNOSIS — Z992 Dependence on renal dialysis: Secondary | ICD-10-CM | POA: Diagnosis not present

## 2024-04-17 DIAGNOSIS — D509 Iron deficiency anemia, unspecified: Secondary | ICD-10-CM | POA: Diagnosis not present

## 2024-04-17 DIAGNOSIS — D631 Anemia in chronic kidney disease: Secondary | ICD-10-CM | POA: Diagnosis not present

## 2024-04-20 ENCOUNTER — Other Ambulatory Visit: Payer: Self-pay | Admitting: Cardiovascular Disease

## 2024-04-20 DIAGNOSIS — N186 End stage renal disease: Secondary | ICD-10-CM | POA: Diagnosis not present

## 2024-04-20 DIAGNOSIS — D509 Iron deficiency anemia, unspecified: Secondary | ICD-10-CM | POA: Diagnosis not present

## 2024-04-20 DIAGNOSIS — E782 Mixed hyperlipidemia: Secondary | ICD-10-CM

## 2024-04-20 DIAGNOSIS — N250 Renal osteodystrophy: Secondary | ICD-10-CM | POA: Diagnosis not present

## 2024-04-20 DIAGNOSIS — D631 Anemia in chronic kidney disease: Secondary | ICD-10-CM | POA: Diagnosis not present

## 2024-04-20 DIAGNOSIS — Z992 Dependence on renal dialysis: Secondary | ICD-10-CM | POA: Diagnosis not present

## 2024-04-22 DIAGNOSIS — N186 End stage renal disease: Secondary | ICD-10-CM | POA: Diagnosis not present

## 2024-04-22 DIAGNOSIS — Z992 Dependence on renal dialysis: Secondary | ICD-10-CM | POA: Diagnosis not present

## 2024-04-23 MED ORDER — ROSUVASTATIN CALCIUM 40 MG PO TABS: 40.0000 mg | ORAL_TABLET | Freq: Every day | ORAL | 3 refills | Status: AC

## 2024-04-24 ENCOUNTER — Ambulatory Visit: Admitting: Dermatology

## 2024-04-24 DIAGNOSIS — W908XXA Exposure to other nonionizing radiation, initial encounter: Secondary | ICD-10-CM | POA: Diagnosis not present

## 2024-04-24 DIAGNOSIS — L578 Other skin changes due to chronic exposure to nonionizing radiation: Secondary | ICD-10-CM

## 2024-04-24 DIAGNOSIS — C44329 Squamous cell carcinoma of skin of other parts of face: Secondary | ICD-10-CM

## 2024-04-24 DIAGNOSIS — L821 Other seborrheic keratosis: Secondary | ICD-10-CM | POA: Diagnosis not present

## 2024-04-24 DIAGNOSIS — C4442 Squamous cell carcinoma of skin of scalp and neck: Secondary | ICD-10-CM | POA: Diagnosis not present

## 2024-04-24 DIAGNOSIS — D489 Neoplasm of uncertain behavior, unspecified: Secondary | ICD-10-CM

## 2024-04-24 DIAGNOSIS — C4492 Squamous cell carcinoma of skin, unspecified: Secondary | ICD-10-CM

## 2024-04-24 HISTORY — DX: Squamous cell carcinoma of skin, unspecified: C44.92

## 2024-04-24 NOTE — Patient Instructions (Addendum)

## 2024-04-24 NOTE — Progress Notes (Unsigned)
   Follow-Up Visit   Subjective  Matthew [name] is a 89 y.o. male who presents for the following: LOC of the scalp, face, and ear. Has been present for a while. Irritation of the LOC on the right temple due to sleeping on area. No use of any topical creams for concerned areas.   The following portions of the chart were reviewed this encounter and updated as appropriate: medications, allergies, medical history  Review of Systems:  No other skin or systemic complaints except as noted in HPI or Assessment and Plan.  Objective  Well appearing patient in no apparent distress; mood and affect are within normal limits.  A focused examination was performed of the following areas: Face, left ear, scalp,   Relevant exam findings are noted in the Assessment and Plan.  Right Temple 9mm keratotic papule with cutaneous horn   Right Parietal 1.1 cm keratotic plaque   Assessment & Plan   SEBORRHEIC KERATOSIS - Stuck-on, waxy, tan-brown papules and/or plaques left ear, and scalp - Benign-appearing - Discussed benign etiology and prognosis. - Observe - Call for any changes  NEOPLASM OF UNCERTAIN BEHAVIOR (2) Right Temple Skin / nail biopsy Type of biopsy: tangential   Informed consent: discussed and consent obtained   Timeout: patient name, date of birth, surgical site, and procedure verified   Procedure prep:  Patient was prepped and draped in usual sterile fashion Prep type:  Isopropyl alcohol Anesthesia: the lesion was anesthetized in a standard fashion   Anesthetic:  1% lidocaine  w/ epinephrine  1-100,000 buffered w/ 8.4% NaHCO3 Instrument used: DermaBlade   Hemostasis achieved with: pressure and aluminum chloride   Outcome: patient tolerated procedure well   Post-procedure details: sterile dressing applied and wound care instructions given   Dressing type: bandage and petrolatum    Specimen 1 - Surgical pathology Differential Diagnosis: SK vs SCC  Check Margins: No 9mm  keratotic papule with cutaneous horn Right Parietal Skin / nail biopsy Type of biopsy: tangential   Informed consent: discussed and consent obtained   Timeout: patient name, date of birth, surgical site, and procedure verified   Procedure prep:  Patient was prepped and draped in usual sterile fashion Prep type:  Isopropyl alcohol Anesthesia: the lesion was anesthetized in a standard fashion   Anesthetic:  1% lidocaine  w/ epinephrine  1-100,000 buffered w/ 8.4% NaHCO3 Instrument used: DermaBlade   Hemostasis achieved with: pressure and aluminum chloride   Outcome: patient tolerated procedure well   Post-procedure details: sterile dressing applied and wound care instructions given   Dressing type: bandage and petrolatum    Specimen 2 - Surgical pathology Differential Diagnosis: SK VS SCC  Check Margins: No 1.1 cm keratotic plaque  Return for Pending biopsy results.  IAlmetta Nora, RMA, am acting as scribe for Boneta Sharps, MD .   Documentation: I have reviewed the above documentation for accuracy and completeness, and I agree with the above.  Boneta Sharps, MD

## 2024-04-26 ENCOUNTER — Encounter: Payer: Self-pay | Admitting: Dermatology

## 2024-04-26 ENCOUNTER — Ambulatory Visit: Payer: Self-pay | Admitting: Dermatology

## 2024-04-26 ENCOUNTER — Telehealth: Payer: Self-pay

## 2024-04-26 DIAGNOSIS — C4492 Squamous cell carcinoma of skin, unspecified: Secondary | ICD-10-CM

## 2024-04-26 LAB — SURGICAL PATHOLOGY

## 2024-04-26 NOTE — Telephone Encounter (Signed)
 Tiffany with [facility] called asking for verbal okay for PT 1w1, 2w2, 1w6

## 2024-04-30 ENCOUNTER — Encounter: Payer: Self-pay | Admitting: Dermatology

## 2024-04-30 DIAGNOSIS — Z992 Dependence on renal dialysis: Secondary | ICD-10-CM | POA: Diagnosis not present

## 2024-04-30 NOTE — Telephone Encounter (Signed)
 Called tiffany and informed her of verbal okay

## 2024-04-30 NOTE — Telephone Encounter (Signed)
 Patient's daughter advised pathology results. Scheduled for [facility] to right parietal scalp 05/30/23 with Dr. Claudene, referral sent to Dr. Corey for right temple. Lonell RAMAN., RMA

## 2024-04-30 NOTE — Telephone Encounter (Signed)
-----   Message from [place] sent at 04/30/2024  1:14 PM EST ----- Rare risk of spread beyond the skin, which can be life-threatening ----- Message ----- From: Lorren Lonell FERNS, CMA Sent: 04/30/2024  12:34 PM EST To: Boneta Sharps, MD  Spoke with patient's daughter and she asked what would happen if they decided not to treat. I told her I would check with you and call her back.  ----- Message ----- From: Sharps Boneta, MD Sent: 04/26/2024   5:37 PM EST To: [facility]ps [facility]  Diagnosis: 1. Skin, right temple :       WELL DIFFERENTIATED SQUAMOUS CELL CARCINOMA        2. Skin, right parietal :       WELL DIFFERENTIATED SQUAMOUS CELL CARCINOMA WITH SUPERFICIAL INFILTRATION   Please call with diagnosis and refer to Mohs surgery.  R temple Explanation: This is a squamous cell skin cancer that has grown beyond the surface of the skin and is invading the second layer of the skin. It has the potential to spread beyond the skin and  threaten your health, so I recommend treating it.  Treatment: Given the location and type of skin cancer, I recommend Mohs surgery. Mohs surgery involves cutting out the skin cancer and then checking under the microscope to ensure the whole skin  cancer was removed. If any skin cancer remains, the surgeon will cut out more until it is fully removed. The cure rate is about 98-99%. Once the Mohs surgeon confirms the skin cancer is out, they  will discuss the options to repair or heal the area. You must take it easy for about two weeks after surgery (no lifting over 10-15 lbs, avoid activity to get your heart rate and blood pressure up).  It is done at another office outside of [place] ([place], [place], or [place]).  2. R parietal scalp Explanation: This is a squamous cell skin cancer that has grown beyond the surface of the skin and is invading the second layer of the skin. It is not as thick as the one on the right temple Treatment: EDC vs  Mohs ----- Message ----- From: Interface, Lab In Three Zero One Sent: 04/26/2024   5:32 PM EST To: Boneta Sharps, MD

## 2024-05-10 ENCOUNTER — Ambulatory Visit: Admitting: Cardiovascular Disease

## 2024-05-10 ENCOUNTER — Encounter: Payer: Self-pay | Admitting: Cardiovascular Disease

## 2024-05-10 VITALS — BP 111/73 | HR 112 | Ht 70.0 in | Wt 183.6 lb

## 2024-05-10 DIAGNOSIS — E782 Mixed hyperlipidemia: Secondary | ICD-10-CM

## 2024-05-10 DIAGNOSIS — I429 Cardiomyopathy, unspecified: Secondary | ICD-10-CM | POA: Diagnosis not present

## 2024-05-10 DIAGNOSIS — I5020 Unspecified systolic (congestive) heart failure: Secondary | ICD-10-CM

## 2024-05-10 DIAGNOSIS — I120 Hypertensive chronic kidney disease with stage 5 chronic kidney disease or end stage renal disease: Secondary | ICD-10-CM | POA: Diagnosis not present

## 2024-05-10 DIAGNOSIS — R0602 Shortness of breath: Secondary | ICD-10-CM | POA: Diagnosis not present

## 2024-05-10 DIAGNOSIS — N186 End stage renal disease: Secondary | ICD-10-CM | POA: Diagnosis not present

## 2024-05-10 DIAGNOSIS — I25810 Atherosclerosis of coronary artery bypass graft(s) without angina pectoris: Secondary | ICD-10-CM | POA: Diagnosis not present

## 2024-05-10 DIAGNOSIS — E1122 Type 2 diabetes mellitus with diabetic chronic kidney disease: Secondary | ICD-10-CM | POA: Diagnosis not present

## 2024-05-10 DIAGNOSIS — Z992 Dependence on renal dialysis: Secondary | ICD-10-CM

## 2024-05-10 MED ORDER — LEVOCETIRIZINE DIHYDROCHLORIDE 5 MG PO TABS: 5.0000 mg | ORAL_TABLET | Freq: Every evening | ORAL | 3 refills | Status: DC

## 2024-05-10 NOTE — Progress Notes (Signed)
 Cardiology Office Note   Date:  05/10/2024   ID:  Matthew [name], DOB 1931-01-20, MRN [id number]  PCP:  Albina GORMAN Dine, MD  Cardiologist:  Denyse Bathe, MD      History of Present Illness: Matthew [name] is a 89 y.o. male who presents for  Chief Complaint  Patient presents with   Follow-up    4 week follow up. Pt has a cough.     Has cough,and SOB.      Past Medical History:  Diagnosis Date   Anginal pain    Arthritis    Ascending aortic aneurysm 07/18/2015   a.) TTE 07/18/2015: severe dilation of ascending aorta measuring 7.0 cm. b.) TTE 01/10/2018: aortic root 3.8 cm; ascending aorta 6.8 cm; refused surgical intervention/repair.   Atrial fibrillation (HCC)    a.) CHA2DS2-VASc = 6 (age x 2, HFrEF, HTN, previous MI, T2DM). b.) rate/rhythm maintained on amiodarone  + metoprolol  succinate; chronic antiplatelet therapy using clopidogrel    Bradycardia    Coronary artery disease    ESRD (end stage renal disease) (HCC)    GERD (gastroesophageal reflux disease)    HFrEF (heart failure with reduced ejection fraction) (HCC)    a.) TTE 12/03/2013: mod dec LV function; EF 35-40%; severe inferolateral and inferior HK; LA dilated; G3DD; PASP 52 mmHg. b.) TTE 07/18/2015: mild LV dysfunction with mild LVH; EF 45%; mild BAE. c.) TTE 01/10/2018: mod LV dysfunction; EF 45%; diffuse HK   HLD (hyperlipidemia)    Hypertension    IDA (iron deficiency anemia)    Long term current use of antithrombotics/antiplatelets    a.) clopidogrel    Myocardial infarction (HCC) 2000   Pneumonia    S/P CABG x 4 2001   a.) LIMA-LAD, SVG-D1, SVG-OM1, SVG-OM2, SVG-PDA   SCC (squamous cell carcinoma) 04/24/2024   right parietal scalp, EDC scheduled 05/29/24   SCC (squamous cell carcinoma) 04/24/2024   right temple, Mohs referral sent to Dr. 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 c.) TTE 01/10/2018: mod AR/TR     Past Surgical History:  Procedure Laterality Date   A/V FISTULAGRAM Left 02/09/2023   Procedure: A/V Fistulagram;  Surgeon: Marea Selinda GORMAN, MD;  Location: [facility];  Service: Cardiovascular;  Laterality: Left;   A/V SHUNT INTERVENTION N/A 01/25/2023   Procedure: A/V SHUNT INTERVENTION;  Surgeon: Jama Cordella MATSU, MD;  Location: [facility];  Service: Cardiovascular;  Laterality: N/A;   A/V SHUNTOGRAM Left 04/01/2022   Procedure: A/V SHUNTOGRAM;  Surgeon: Marea Selinda GORMAN, MD;  Location: [facility];  Service: Cardiovascular;  Laterality: Left;   AV FISTULA PLACEMENT Left 04/15/2021   Procedure: INSERTION OF ARTERIOVENOUS (AV) GORE-TEX GRAFT ARM ( BRACHIAL AXILLARY);  Surgeon: Marea Selinda GORMAN, MD;  Location: [facility];  Service: Vascular;  Laterality: Left;   CARDIOVERSION N/A 04/02/2024   Procedure: CARDIOVERSION;  Surgeon: Hilarie Rocher, MD;  Location: [facility];  Service: Cardiovascular;  Laterality: N/A;   CATARACT EXTRACTION W/PHACO Left 07/26/2017   Procedure: CATARACT EXTRACTION PHACO AND INTRAOCULAR LENS PLACEMENT (IOC);  Surgeon: Jaye Fallow, MD;  Location: [facility];  Service: Ophthalmology;  Laterality: Left;  US    00:45.8 AP%  13.1 CDE  6.00 Fluid Pack Lot # [id number]   CATARACT EXTRACTION W/PHACO Right 08/17/2017   Procedure: CATARACT EXTRACTION PHACO AND INTRAOCULAR LENS PLACEMENT (IOC);  Surgeon: Jaye Fallow, MD;  Location: [facility];  Service: Ophthalmology;  Laterality: Right;  US   01:30 AP% 16.8 CDE 15.24 Fluid pack lot # [id number]   CHOLECYSTECTOMY     CORONARY ARTERY BYPASS GRAFT N/A 2001   Procedure: 5v CABG (LIMA-LAD, SVG-D1, SVG-OM1, SVG-OM2, SVG-PDA)   DIALYSIS/PERMA CATHETER INSERTION N/A 02/09/2018   Procedure:  DIALYSIS/PERMA CATHETER INSERTION;  Surgeon: Marea Selinda RAMAN, MD;  Location: [facility];  Service: Cardiovascular;  Laterality: N/A;   DIALYSIS/PERMA CATHETER INSERTION N/A 02/09/2023   Procedure: DIALYSIS/PERMA CATHETER INSERTION;  Surgeon: Marea Selinda RAMAN, MD;  Location: [facility];  Service: Cardiovascular;  Laterality: N/A;   DIALYSIS/PERMA CATHETER INSERTION N/A 11/28/2023   Procedure: DIALYSIS/PERMA CATHETER INSERTION;  Surgeon: Marea Selinda RAMAN, MD;  Location: [facility];  Service: Cardiovascular;  Laterality: N/A;   DIALYSIS/PERMA CATHETER REMOVAL N/A 06/25/2021   Procedure: DIALYSIS/PERMA CATHETER REMOVAL;  Surgeon: Marea Selinda RAMAN, MD;  Location: [facility];  Service: Cardiovascular;  Laterality: N/A;   FRACTURE SURGERY Left 2015   LEFT arm   INSERTION OF DIALYSIS CATHETER Right 11/03/2020   Procedure: INSERTION OF Perm Cath in the RIGHT INTERNAL JUGULAR;  Surgeon: Marea Selinda RAMAN, MD;  Location: [facility];  Service: Vascular;  Laterality: Right;   INTRAMEDULLARY (IM) NAIL INTERTROCHANTERIC Right 11/12/2022   Procedure: INTRAMEDULLARY (IM) NAIL INTERTROCHANTERIC;  Surgeon: Marchia Drivers, MD;  Location: [facility];  Service: Orthopedics;  Laterality: Right;   LEFT HEART CATHETERIZATION WITH CORONARY/GRAFT ANGIOGRAM Left 12/04/2013   Procedure: LEFT HEART CATHETERIZATION WITH EL BILE;  Surgeon: Alm LELON Clay, MD;  [facility]ATH LAB;  Service: Cardiovascular;  Laterality: Left;   LEFT HEART CATHETERIZATION WITH CORONARY/GRAFT ANGIOGRAM Left 02/06/2009   Procedure: LEFT HEART CATHETERIZATION WITH CORONARY/GRAFT ANGIOGRAM; Location: [facility]; Surgeon: Vita Bathe, MD   PERCUTANEOUS CORONARY STENT INTERVENTION (PCI-S) N/A 12/06/2013   Procedure: STAGED PERCUTANEOUS CORONARY STENT INTERVENTION (overlapping 4.0 x 38 mm (mid) and 4.0 x 28 mm (ostial) Promus Primier DES to SVG-RPDA graft);  Surgeon: Victory LELON Claudene DOUGLAS, MD;  Location: [facility] CATH LAB;  Service:  Cardiovascular   PERIPHERAL VASCULAR THROMBECTOMY Left 08/31/2023   Procedure: PERIPHERAL VASCULAR THROMBECTOMY;  Surgeon: Jama Cordella MATSU, MD;  Location: [facility];  Service: Cardiovascular;  Laterality: Left;   REMOVAL OF A DIALYSIS CATHETER N/A 11/03/2020   Procedure: REMOVAL OF A DIALYSIS CATHETER;  Surgeon: Marea Selinda RAMAN, MD;  Location: [facility];  Service: Vascular;  Laterality: N/A;   TEE WITHOUT CARDIOVERSION N/A 04/02/2024   Procedure: ECHOCARDIOGRAM, TRANSESOPHAGEAL;  Surgeon: Hilarie Rocher, MD;  Location: [facility];  Service: Cardiovascular;  Laterality: N/A;   TEMPORARY DIALYSIS CATHETER N/A 02/04/2023   Procedure: TEMPORARY DIALYSIS CATHETER;  Surgeon: Jama Cordella MATSU, MD;  Location: [facility];  Service: Cardiovascular;  Laterality: N/A;   TOTAL HIP REVISION Right 01/12/2023   Procedure: CONVERSION TO  RIGHT TOTAL HIP REPLACEMENT;  Surgeon: Edna Toribio LABOR, MD;  [facility];  Service: Orthopedics;  Laterality: Right;     Current Outpatient Medications  Medication Sig Dispense Refill   amiodarone  (PACERONE ) 200 MG tablet TAKE 4 TABLETS BY MOUTH DAILY FOR 7 DAYS, THEN 3 DAILY FOR 7 DAYS, THEN 2 DAILY FOR 7 DAYS, THEN FOLLOW UP 70 tablet 0  apixaban  (ELIQUIS ) 2.5 MG TABS tablet Take 1 tablet (2.5 mg total) by mouth 2 (two) times daily. 60 tablet 2   calcium  carbonate (OSCAL) 1500 (600 Ca) MG TABS tablet Take 1,500 mg by mouth daily.     calcium  carbonate (TUMS EX) 750 MG chewable tablet Chew 1 tablet by mouth 3 (three) times daily.     cholecalciferol  (VITAMIN D ) 25 MCG (1000 UNIT) tablet Take 1,000 Units by mouth daily with breakfast.     ferrous sulfate  325 (65 FE) MG tablet Take 325 mg by mouth 2 (two) times daily with a meal.      fluticasone  (FLONASE ) 50 MCG/ACT nasal spray Place 2 sprays into both nostrils daily as needed for allergies or rhinitis. 60 mL 1   furosemide  (LASIX ) 40 MG tablet Take 1 tablet (40 mg total) by mouth daily. 30 tablet 11    glipiZIDE  (GLUCOTROL ) 5 MG tablet Take 0.5 tablets (2.5 mg total) by mouth daily before breakfast. 45 tablet 1   hydrOXYzine  (ATARAX ) 10 MG tablet Take 1 tablet (10 mg total) by mouth 3 (three) times daily as needed for anxiety or itching. 30 tablet 0   levocetirizine (XYZAL  ALLERGY 24HR) 5 MG tablet Take 5 mg by mouth every evening.     levothyroxine  (SYNTHROID ) 100 MCG tablet TAKE 1 TABLET BY MOUTH IN THE  MORNING 90 tablet 3   midodrine  (PROAMATINE ) 10 MG tablet See administration instructions. Take 10 mg by mouth 1 hour before dialysis and 30 minutes before procedure ends.     multivitamin (RENA-VIT) TABS tablet Take 1 tablet by mouth daily.     nitroGLYCERIN  (NITROSTAT ) 0.4 MG SL tablet DISSOLVE 1 TABLET UNDER THE  TONGUE EVERY 5 MINUTES AS NEEDED FOR CHEST PAIN. MAX OF 3 TABLETS IN 15 MINUTES. CALL 911 IF PAIN  PERSISTS. 100 tablet 3   Nutritional Supplements (FEEDING SUPPLEMENT, NEPRO CARB STEADY,) LIQD Take 237 mLs by mouth 2 (two) times daily between meals. 20000 mL 3   Omega-3 Fatty Acids (FISH OIL ) 1000 MG CAPS Take 4,000 mg by mouth daily with lunch.     rosuvastatin  (CRESTOR ) 40 MG tablet Take 1 tablet (40 mg total) by mouth daily. 90 tablet 3   senna-docusate (SENOKOT S) 8.6-50 MG tablet Take 2 tablets by mouth at bedtime as needed for moderate constipation.     No current facility-administered medications for this visit.    Allergies:   Statins and Amoxicillin    Social History:   reports that he quit smoking about 33 years ago. His smoking use included pipe and cigarettes. He has never used smokeless tobacco. He reports that he does not currently use alcohol after a past usage of about 1.0 standard drink of alcohol per week. He reports that he does not use drugs.   Family History:  family history includes Heart attack (age of onset: 41) in his father; Heart failure in his mother.    ROS:     Review of Systems  Constitutional: Negative.   HENT: Negative.    Eyes:  Negative.   Respiratory: Negative.    Gastrointestinal: Negative.   Genitourinary: Negative.   Musculoskeletal: Negative.   Skin: Negative.   Neurological: Negative.   Endo/Heme/Allergies: Negative.   Psychiatric/Behavioral: Negative.    All other systems reviewed and are negative.     All other systems are reviewed and negative.    PHYSICAL EXAM: VS:  BP 111/73   Pulse (!) 112   Ht 5' 10 (1.778 m)  Wt 183 lb 9.6 oz (83.3 kg)   SpO2 99%   BMI 26.34 kg/m  , BMI Body mass index is 26.34 kg/m. Last weight:  Wt Readings from Last 3 Encounters:  05/10/24 183 lb 9.6 oz (83.3 kg)  04/12/24 187 lb 12.8 oz (85.2 kg)  04/10/24 187 lb (84.8 kg)     Physical Exam Vitals reviewed.  Constitutional:      Appearance: Normal appearance. He is normal weight.  HENT:     Head: Normocephalic.     Nose: Nose normal.     Mouth/Throat:     Mouth: Mucous membranes are moist.  Eyes:     Pupils: Pupils are equal, round, and reactive to light.  Cardiovascular:     Rate and Rhythm: Normal rate and regular rhythm.     Pulses: Normal pulses.     Heart sounds: Normal heart sounds.  Pulmonary:     Effort: Pulmonary effort is normal.  Abdominal:     General: Abdomen is flat. Bowel sounds are normal.  Musculoskeletal:        General: Normal range of motion.     Cervical back: Normal range of motion.  Skin:    General: Skin is warm.  Neurological:     General: No focal deficit present.     Mental Status: He is alert.  Psychiatric:        Mood and Affect: Mood normal.       EKG:   Recent Labs: 06/30/2023: TSH 3.470 11/25/2023: ALT 24 03/29/2024: B Natriuretic Peptide 746.2 03/31/2024: Magnesium  2.2 04/03/2024: BUN 39; Creatinine, Ser 2.69; Hemoglobin 12.3; Platelets 84; Potassium 5.5; Sodium 130    Lipid Panel    Component Value Date/Time   CHOL 122 09/29/2023 0959   TRIG 53 09/29/2023 0959   HDL 65 09/29/2023 0959   CHOLHDL 1.9 09/29/2023 0959   LDLCALC 45 09/29/2023 0959       Other studies Reviewed: Additional studies/ records that were reviewed today include:  Review of the above records demonstrates:       No data to display            ASSESSMENT AND PLAN:    ICD-10-CM   1. Mixed hyperlipidemia  E78.2     2. SOB (shortness of breath)  R06.02    Give lasix  bid for 1 week and thgen once a day.    3. ESRD (end stage renal disease) (HCC)  N18.6     4. Cardiomyopathy, unspecified type (HCC)  I42.9     5. Coronary artery disease involving coronary bypass graft of native heart without angina pectoris  I25.810     6. HFrEF (heart failure with reduced ejection fraction) (HCC)  I50.20     7. 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)  E11.22    I12.0    Z99.2    N18.6        Problem List Items Addressed This Visit       Cardiovascular and Mediastinum   Cardiomyopathy (HCC)   HFrEF (heart failure with reduced ejection fraction) (HCC)   Coronary artery disease     Genitourinary   ESRD (end stage renal disease) (HCC)     Other   HLD (hyperlipidemia) - Primary   Other Visit Diagnoses       SOB (shortness of breath)       Give lasix  bid for 1 week and thgen once a day.     Type 2 DM with  hypertension and ESRD on dialysis [facility])              Disposition:   Return in about 4 weeks (around 06/07/2024).    Total time spent: 30 minutes  Signed,  Denyse Bathe, MD  05/10/2024 10:27 AM    [facility]

## 2024-05-18 ENCOUNTER — Other Ambulatory Visit: Payer: Self-pay | Admitting: Cardiovascular Disease

## 2024-05-23 ENCOUNTER — Other Ambulatory Visit: Payer: Self-pay | Admitting: Internal Medicine

## 2024-05-23 MED ORDER — LEVOCETIRIZINE DIHYDROCHLORIDE 5 MG PO TABS: 5.0000 mg | ORAL_TABLET | Freq: Every evening | ORAL | 3 refills | Status: AC

## 2024-05-29 ENCOUNTER — Ambulatory Visit (INDEPENDENT_AMBULATORY_CARE_PROVIDER_SITE_OTHER): Admitting: Dermatology

## 2024-05-29 ENCOUNTER — Encounter: Payer: Self-pay | Admitting: Dermatology

## 2024-05-29 DIAGNOSIS — C4492 Squamous cell carcinoma of skin, unspecified: Secondary | ICD-10-CM

## 2024-05-29 DIAGNOSIS — C4442 Squamous cell carcinoma of skin of scalp and neck: Secondary | ICD-10-CM | POA: Diagnosis not present

## 2024-05-29 NOTE — Patient Instructions (Signed)
 Electrodesiccation and Curettage ("Scrape and Burn") Wound Care Instructions  Leave the original bandage on for 24 hours if possible.  If the bandage becomes soaked or soiled before that time, it is OK to remove it and examine the wound.  A small amount of post-operative bleeding is normal.  If excessive bleeding occurs, remove the bandage, place gauze over the site and apply continuous pressure (no peeking) over the area for 30 minutes. If this does not work, please call our clinic as soon as possible or page your doctor if it is after hours.   Once a day, cleanse the wound with soap and water. It is fine to shower. If a thick crust develops you may use a Q-tip dipped into dilute hydrogen peroxide (mix 1:1 with water) to dissolve it.  Hydrogen peroxide can slow the healing process, so use it only as needed.    After washing, apply petroleum jelly (Vaseline) or an antibiotic ointment if your doctor prescribed one for you, followed by a bandage.    For best healing, the wound should be covered with a layer of ointment at all times. If you are not able to keep the area covered with a bandage to hold the ointment in place, this may mean re-applying the ointment several times a day.  Continue this wound care until the wound has healed and is no longer open. It may take several weeks for the wound to heal and close.  Itching and mild discomfort is normal during the healing process.  If you have any discomfort, you can take Tylenol  (acetaminophen ) or ibuprofen as directed on the bottle. (Please do not take these if you have an allergy to them or cannot take them for another reason).  Some redness, tenderness and white or yellow material in the wound is normal healing.  If the area becomes very sore and red, or develops a thick yellow-green material (pus), it may be infected; please notify us .    Wound healing continues for up to one year following surgery. It is not unusual to experience pain in the scar  from time to time during the interval.  If the pain becomes severe or the scar thickens, you should notify the office.    A slight amount of redness in a scar is expected for the first six months.  After six months, the redness will fade and the scar will soften and fade.  The color difference becomes less noticeable with time.  If there are any problems, return for a post-op surgery check at your earliest convenience.  To improve the appearance of the scar, you can use silicone scar gel, cream, or sheets (such as Mederma or Serica) every night for up to one year. These are available over the counter (without a prescription).  Please call our office at [phone] for any questions or concerns.  Due to recent changes in healthcare laws, you may see results of your pathology and/or laboratory studies on MyChart before the doctors have had a chance to review them. We understand that in some cases there may be results that are confusing or concerning to you. Please understand that not all results are received at the same time and often the doctors may need to interpret multiple results in order to provide you with the best plan of care or course of treatment. Therefore, we ask that you please give us  2 business days to thoroughly review all your results before contacting the office for clarification. Should we see a  critical lab result, you will be contacted sooner.   If You Need Anything After Your Visit  If you have any questions or concerns for your doctor, please call our main line at [phone] and press option 4 to reach your doctor's medical assistant. If no one answers, please leave a voicemail as directed and we will return your call as soon as possible. Messages left after 4 pm will be answered the following business day.   You may also send us  a message via MyChart. We typically respond to MyChart messages within 1-2 business days.  For prescription refills, please ask your pharmacy to  contact our office. Our fax number is [phone].  If you have an urgent issue when the clinic is closed that cannot wait until the next business day, you can page your doctor at the number below.    Please note that while we do our best to be available for urgent issues outside of office hours, we are not available 24/7.   If you have an urgent issue and are unable to reach us , you may choose to seek medical care at your doctor's office, retail clinic, urgent care center, or emergency room.  If you have a medical emergency, please immediately call 911 or go to the emergency department.  Pager Numbers  - Dr. Hester: [phone]  - Dr. Jackquline: [phone]  - Dr. Claudene: [phone]   - Dr. Raymund: [phone]  In the event of inclement weather, please call our main line at [phone] for an update on the status of any delays or closures.  Dermatology Medication Tips: Please keep the boxes that topical medications come in in order to help keep track of the instructions about where and how to use these. Pharmacies typically print the medication instructions only on the boxes and not directly on the medication tubes.   If your medication is too expensive, please contact our office at [phone] option 4 or send us  a message through MyChart.   We are unable to tell what your co-pay for medications will be in advance as this is different depending on your insurance coverage. However, we may be able to find a substitute medication at lower cost or fill out paperwork to get insurance to cover a needed medication.   If a prior authorization is required to get your medication covered by your insurance company, please allow us  1-2 business days to complete this process.  Drug prices often vary depending on where the prescription is filled and some pharmacies may offer cheaper prices.  The website www.goodrx.com contains coupons for medications through different pharmacies. The prices  here do not account for what the cost may be with help from insurance (it may be cheaper with your insurance), but the website can give you the price if you did not use any insurance.  - You can print the associated coupon and take it with your prescription to the pharmacy.  - You may also stop by our office during regular business hours and pick up a GoodRx coupon card.  - If you need your prescription sent electronically to a different pharmacy, notify our office through [facility] or by phone at [phone] option 4.     Si Usted Necesita Algo Despus de Su Visita  Tambin puede enviarnos un mensaje a travs de Clinical cytogeneticist. Por lo general respondemos a los mensajes de MyChart en el transcurso de 1 a 2 das hbiles.  Para renovar recetas, por favor pida a su farmacia que  se ponga en contacto con nuestra oficina. Randi lakes de fax es [place] [phone].  Si tiene un asunto urgente cuando la clnica est cerrada y que no puede esperar hasta el siguiente da hbil, puede llamar/localizar a su doctor(a) al nmero que aparece a continuacin.   Por favor, tenga en cuenta que aunque hacemos todo lo posible para estar disponibles para asuntos urgentes fuera del horario de [place], no estamos disponibles las 24 horas del da, los 7 [address]  [address] de la [place].   Si tiene un problema urgente y no puede comunicarse con nosotros, puede optar por buscar atencin mdica  en el consultorio de su doctor(a), en una clnica privada, en un centro de atencin urgente o en una sala de emergencias.  Si tiene Engineer, drilling, por favor llame inmediatamente al 911 o vaya a la sala de emergencias.  Nmeros de bper  - Dr. Hester: [phone]  - Dra. Jackquline: [phone]  - Dr. Claudene: [phone]  - Dra. Kitts: [phone]  En caso de inclemencias del [place], por favor llame a nuestra lnea principal al [phone] para una actualizacin sobre el estado de cualquier retraso o cierre.  Consejos  para la medicacin en dermatologa: Por favor, guarde las cajas en las que vienen los medicamentos de uso tpico para ayudarle a seguir las instrucciones sobre dnde y cmo usarlos. Las farmacias generalmente imprimen las instrucciones del medicamento slo en las cajas y no directamente en los tubos del [place].   Si su medicamento es muy caro, por favor, pngase en contacto con landry rieger llamando al [phone] y presione la opcin 4 o envenos un mensaje a travs de Clinical cytogeneticist.   No podemos decirle cul ser su copago por los medicamentos por adelantado ya que esto es diferente dependiendo de la cobertura de su seguro. Sin embargo, es posible que podamos encontrar un medicamento sustituto a Audiological scientist un formulario para que el seguro cubra el medicamento que se considera necesario.   Si se requiere una autorizacin previa para que su compaa de seguros malta su medicamento, por favor permtanos de 1 a 2 das hbiles para completar este proceso.  Los precios de los medicamentos varan con frecuencia dependiendo del Environmental consultant de dnde se surte la receta y alguna farmacias pueden ofrecer precios ms baratos.  El sitio web www.goodrx.com tiene cupones para medicamentos de Health and safety inspector. Los precios aqu no tienen en cuenta lo que podra costar con la ayuda del seguro (puede ser ms barato con su seguro), pero el sitio web puede darle el precio si no utiliz Tourist information centre manager.  - Puede imprimir el cupn correspondiente y llevarlo con su receta a la farmacia.  - Tambin puede pasar por nuestra oficina durante el horario de atencin regular y Education officer, museum una tarjeta de cupones de GoodRx.  - Si necesita que su receta se enve electrnicamente a una farmacia diferente, informe a nuestra oficina a travs de MyChart de [place] o por telfono llamando al [phone] y presione la opcin 4.

## 2024-05-29 NOTE — Progress Notes (Signed)
" ° °  Follow-Up Visit   Subjective  Matthew [name] is a 89 y.o. male who presents for the following: [facility]   The following portions of the chart were reviewed this encounter and updated as appropriate: medications, allergies, medical history  Review of Systems:  No other skin or systemic complaints except as noted in HPI or Assessment and Plan.  Objective  Well appearing patient in no apparent distress; mood and affect are within normal limits.   A focused examination was performed of the following areas: scalp  Relevant exam findings are noted in the Assessment and Plan.  right parietal scalp Pink bx site  Assessment & Plan     SQUAMOUS CELL CARCINOMA OF SKIN right parietal scalp - Destruction of lesion Complexity: simple   Destruction method: electrodesiccation and curettage   Informed consent: discussed and consent obtained   Timeout:  patient name, date of birth, surgical site, and procedure verified Procedure prep:  Patient was prepped and draped in usual sterile fashion Prep type:  Isopropyl alcohol Anesthesia: the lesion was anesthetized in a standard fashion   Anesthetic:  1% lidocaine  w/ epinephrine  1-100,000 local infiltration Curettage performed in three different directions: Yes   Electrodesiccation performed over the curetted area: Yes   Curettage cycles:  3 Lesion length (cm):  2.3 Lesion width (cm):  1.4 Final wound size (cm):  2.3 Hemostasis achieved with:  aluminum chloride and electrodesiccation Outcome: patient tolerated procedure well with no complications   Post-procedure details: sterile dressing applied and wound care instructions given   Dressing type: bandage and petrolatum    Return in about 6 months (around 11/26/2024) for TBSE, with Dr. Claudene, [facility].  LILLETTE Lonell Drones, RMA, am acting as scribe for Boneta Claudene, MD .   Documentation: I have reviewed the above documentation for accuracy and completeness, and I agree with the  above.  Boneta Claudene, MD    "

## 2024-06-07 ENCOUNTER — Encounter: Payer: Self-pay | Admitting: Cardiovascular Disease

## 2024-06-07 ENCOUNTER — Other Ambulatory Visit: Payer: Self-pay | Admitting: Cardiovascular Disease

## 2024-06-07 ENCOUNTER — Ambulatory Visit: Admitting: Cardiovascular Disease

## 2024-06-07 VITALS — BP 90/52 | HR 87 | Ht 70.0 in | Wt 185.0 lb

## 2024-06-07 DIAGNOSIS — N186 End stage renal disease: Secondary | ICD-10-CM | POA: Diagnosis not present

## 2024-06-07 DIAGNOSIS — I480 Paroxysmal atrial fibrillation: Secondary | ICD-10-CM

## 2024-06-07 DIAGNOSIS — E782 Mixed hyperlipidemia: Secondary | ICD-10-CM | POA: Diagnosis not present

## 2024-06-07 DIAGNOSIS — I5020 Unspecified systolic (congestive) heart failure: Secondary | ICD-10-CM

## 2024-06-07 DIAGNOSIS — R0602 Shortness of breath: Secondary | ICD-10-CM | POA: Diagnosis not present

## 2024-06-07 DIAGNOSIS — I25810 Atherosclerosis of coronary artery bypass graft(s) without angina pectoris: Secondary | ICD-10-CM | POA: Diagnosis not present

## 2024-06-07 DIAGNOSIS — I5022 Chronic systolic (congestive) heart failure: Secondary | ICD-10-CM | POA: Diagnosis not present

## 2024-06-07 DIAGNOSIS — Z0130 Encounter for examination of blood pressure without abnormal findings: Secondary | ICD-10-CM

## 2024-06-07 DIAGNOSIS — I429 Cardiomyopathy, unspecified: Secondary | ICD-10-CM

## 2024-06-07 NOTE — Progress Notes (Signed)
 "     Cardiology Office Note   Date:  06/07/2024   ID:  Matthew [name], DOB 1931-03-15, MRN [id number]  PCP:  Albina GORMAN Dine, MD  Cardiologist:  Denyse Bathe, MD      History of Present Illness: Matthew [name] is a 89 y.o. male who presents for  Chief Complaint  Patient presents with   Follow-up    4 weeks follow up    No chest pain or SOB or dizziness.      Past Medical History:  Diagnosis Date   Anginal pain    Arthritis    Ascending aortic aneurysm 07/18/2015   a.) TTE 07/18/2015: severe dilation of ascending aorta measuring 7.0 cm. b.) TTE 01/10/2018: aortic root 3.8 cm; ascending aorta 6.8 cm; refused surgical intervention/repair.   Atrial fibrillation (HCC)    a.) CHA2DS2-VASc = 6 (age x 2, HFrEF, HTN, previous MI, T2DM). b.) rate/rhythm maintained on amiodarone  + metoprolol  succinate; chronic antiplatelet therapy using clopidogrel    Bradycardia    Coronary artery disease    ESRD (end stage renal disease) (HCC)    GERD (gastroesophageal reflux disease)    HFrEF (heart failure with reduced ejection fraction) (HCC)    a.) TTE 12/03/2013: mod dec LV function; EF 35-40%; severe inferolateral and inferior HK; LA dilated; G3DD; PASP 52 mmHg. b.) TTE 07/18/2015: mild LV dysfunction with mild LVH; EF 45%; mild BAE. c.) TTE 01/10/2018: mod LV dysfunction; EF 45%; diffuse HK   HLD (hyperlipidemia)    Hypertension    IDA (iron deficiency anemia)    Long term current use of antithrombotics/antiplatelets    a.) clopidogrel    Myocardial infarction (HCC) 2000   Pneumonia    S/P CABG x 4 2001   a.) LIMA-LAD, SVG-D1, SVG-OM1, SVG-OM2, SVG-PDA   SCC (squamous cell carcinoma) 04/24/2024   right parietal scalp, EDC 05/29/24   SCC (squamous cell carcinoma) 04/24/2024   right temple, Mohs referral sent to Dr. Corey   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   left frontal scalp, EDC   Squamous cell carcinoma of skin 05/05/2020   left temporal scalp, in situ, EDC 05/13/20    T2DM (type 2 diabetes mellitus) (HCC)    Valvular insufficiency 12/03/2013   a.) TTE 12/03/2013: EF 35-40%; mild AR/MR, b.) TTE 07/18/2015: EF 45%; mild AR/PR, mod TR, severe MR. c.) TTE 01/10/2018: mod AR/TR     Past Surgical History:  Procedure Laterality Date   A/V FISTULAGRAM Left 02/09/2023   Procedure: A/V Fistulagram;  Surgeon: Marea Selinda GORMAN, MD;  Location: [facility] INVASIVE CV LAB;  Service: Cardiovascular;  Laterality: Left;   A/V SHUNT INTERVENTION N/A 01/25/2023   Procedure: A/V SHUNT INTERVENTION;  Surgeon: Jama Cordella MATSU, MD;  Location: [facility] CV [facility];  Service: Cardiovascular;  Laterality: N/A;   A/V SHUNTOGRAM Left 04/01/2022   Procedure: A/V SHUNTOGRAM;  Surgeon: Marea Selinda GORMAN, MD;  Location: [facility];  Service: Cardiovascular;  Laterality: Left;   AV FISTULA PLACEMENT Left 04/15/2021   Procedure: INSERTION OF ARTERIOVENOUS (AV) GORE-TEX GRAFT ARM ( BRACHIAL AXILLARY);  Surgeon: Marea Selinda GORMAN, MD;  Location: [facility] ORS;  Service: Vascular;  Laterality: Left;   CARDIOVERSION N/A 04/02/2024   Procedure: CARDIOVERSION;  Surgeon: Hilarie Rocher, MD;  Location: [facility] OR[facility];  Service: Cardiovascular;  Laterality: N/A;   CATARACT EXTRACTION W/PHACO Left 07/26/2017   Procedure: CATARACT EXTRACTION PHACO AND INTRAOCULAR LENS PLACEMENT (IOC);  Surgeon: Jaye Fallow, MD;  Location: [facility] OR[facility];  Service: Ophthalmology;  Laterality:  Left;  US    00:45.8 AP%  13.1 CDE  6.00 Fluid Pack Lot # [id number]   CATARACT EXTRACTION W/PHACO Right 08/17/2017   Procedure: CATARACT EXTRACTION PHACO AND INTRAOCULAR LENS PLACEMENT (IOC);  Surgeon: Jaye Fallow, MD;  Location: [facility] OR[facility];  Service: Ophthalmology;  Laterality: Right;  US   01:30 AP% 16.8 CDE 15.24 Fluid pack lot # [id number]   CHOLECYSTECTOMY     CORONARY ARTERY BYPASS GRAFT N/A 2001   Procedure: 5v CABG (LIMA-LAD, SVG-D1, SVG-OM1, SVG-OM2, SVG-PDA)   DIALYSIS/PERMA CATHETER INSERTION N/A 02/09/2018   Procedure: DIALYSIS/PERMA  CATHETER INSERTION;  Surgeon: Marea Selinda RAMAN, MD;  Location: [facility];  Service: Cardiovascular;  Laterality: N/A;   DIALYSIS/PERMA CATHETER INSERTION N/A 02/09/2023   Procedure: DIALYSIS/PERMA CATHETER INSERTION;  Surgeon: Marea Selinda RAMAN, MD;  Location: [facility];  Service: Cardiovascular;  Laterality: N/A;   DIALYSIS/PERMA CATHETER INSERTION N/A 11/28/2023   Procedure: DIALYSIS/PERMA CATHETER INSERTION;  Surgeon: Marea Selinda RAMAN, MD;  Location: [facility];  Service: Cardiovascular;  Laterality: N/A;   DIALYSIS/PERMA CATHETER REMOVAL N/A 06/25/2021   Procedure: DIALYSIS/PERMA CATHETER REMOVAL;  Surgeon: Marea Selinda RAMAN, MD;  Location: [facility];  Service: Cardiovascular;  Laterality: N/A;   FRACTURE SURGERY Left 2015   LEFT arm   INSERTION OF DIALYSIS CATHETER Right 11/03/2020   Procedure: INSERTION OF Perm Cath in the RIGHT INTERNAL JUGULAR;  Surgeon: Marea Selinda RAMAN, MD;  Location: [facility];  Service: Vascular;  Laterality: Right;   INTRAMEDULLARY (IM) NAIL INTERTROCHANTERIC Right 11/12/2022   Procedure: INTRAMEDULLARY (IM) NAIL INTERTROCHANTERIC;  Surgeon: Marchia Drivers, MD;  Location: [facility];  Service: Orthopedics;  Laterality: Right;   LEFT HEART CATHETERIZATION WITH CORONARY/GRAFT ANGIOGRAM Left 12/04/2013   Procedure: LEFT HEART CATHETERIZATION WITH EL BILE;  Surgeon: Alm LELON Clay, MD;  Location: [facility] CATH LAB;  Service: Cardiovascular;  Laterality: Left;   LEFT HEART CATHETERIZATION WITH CORONARY/GRAFT ANGIOGRAM Left 02/06/2009   Procedure: LEFT HEART CATHETERIZATION WITH CORONARY/GRAFT ANGIOGRAM; Location: [facility]; Surgeon: Vita Bathe, MD   PERCUTANEOUS CORONARY STENT INTERVENTION (PCI-S) N/A 12/06/2013   Procedure: STAGED PERCUTANEOUS CORONARY STENT INTERVENTION (overlapping 4.0 x 38 mm (mid) and 4.0 x 28 mm (ostial) Promus Primier DES to SVG-RPDA graft);  Surgeon: Victory LELON Claudene DOUGLAS, MD;  Location: [facility] CATH LAB;  Service: Cardiovascular    PERIPHERAL VASCULAR THROMBECTOMY Left 08/31/2023   Procedure: PERIPHERAL VASCULAR THROMBECTOMY;  Surgeon: Jama Cordella MATSU, MD;  Location: [facility];  Service: Cardiovascular;  Laterality: Left;   REMOVAL OF A DIALYSIS CATHETER N/A 11/03/2020   Procedure: REMOVAL OF A DIALYSIS CATHETER;  Surgeon: Marea Selinda RAMAN, MD;  Location: [facility];  Service: Vascular;  Laterality: N/A;   TEE WITHOUT CARDIOVERSION N/A 04/02/2024   Procedure: ECHOCARDIOGRAM, TRANSESOPHAGEAL;  Surgeon: Hilarie Rocher, MD;  Location: [facility] OR[facility];  Service: Cardiovascular;  Laterality: N/A;   TEMPORARY DIALYSIS CATHETER N/A 02/04/2023   Procedure: TEMPORARY DIALYSIS CATHETER;  Surgeon: Jama Cordella MATSU, MD;  Location: [facility];  Service: Cardiovascular;  Laterality: N/A;   TOTAL HIP REVISION Right 01/12/2023   Procedure: CONVERSION TO  RIGHT TOTAL HIP REPLACEMENT;  Surgeon: Edna Toribio LABOR, MD;  Location: [facility] OR;  Service: Orthopedics;  Laterality: Right;     Current Outpatient Medications  Medication Sig Dispense Refill   amiodarone  (PACERONE ) 200 MG tablet TAKE 4 TABLETS BY MOUTH DAILY FOR 7 DAYS, THEN 3 DAILY FOR 7 DAYS, THEN 2 DAILY FOR 7 DAYS, THEN FOLLOW UP 70 tablet 0  apixaban  (ELIQUIS ) 2.5 MG TABS tablet Take 1 tablet (2.5 mg total) by mouth 2 (two) times daily. 60 tablet 2   calcium  carbonate (OSCAL) 1500 (600 Ca) MG TABS tablet Take 1,500 mg by mouth daily.     calcium  carbonate (TUMS EX) 750 MG chewable tablet Chew 1 tablet by mouth 3 (three) times daily.     cholecalciferol  (VITAMIN D ) 25 MCG (1000 UNIT) tablet Take 1,000 Units by mouth daily with breakfast.     ferrous sulfate  325 (65 FE) MG tablet Take 325 mg by mouth 2 (two) times daily with a meal.      fluticasone  (FLONASE ) 50 MCG/ACT nasal spray Place 2 sprays into both nostrils daily as needed for allergies or rhinitis. 60 mL 1   furosemide  (LASIX ) 40 MG tablet Take 1 tablet (40 mg total) by mouth daily. 30 tablet 11   glipiZIDE   (GLUCOTROL ) 5 MG tablet Take 0.5 tablets (2.5 mg total) by mouth daily before breakfast. 45 tablet 1   hydrOXYzine  (ATARAX ) 10 MG tablet Take 1 tablet (10 mg total) by mouth 3 (three) times daily as needed for anxiety or itching. 30 tablet 0   levocetirizine (XYZAL  ALLERGY 24HR) 5 MG tablet Take 1 tablet (5 mg total) by mouth every evening. 90 tablet 3   levothyroxine  (SYNTHROID ) 100 MCG tablet TAKE 1 TABLET BY MOUTH IN THE  MORNING 90 tablet 3   midodrine  (PROAMATINE ) 10 MG tablet See administration instructions. Take 10 mg by mouth 1 hour before dialysis and 30 minutes before procedure ends.     multivitamin (RENA-VIT) TABS tablet Take 1 tablet by mouth daily.     nitroGLYCERIN  (NITROSTAT ) 0.4 MG SL tablet DISSOLVE 1 TABLET UNDER THE  TONGUE EVERY 5 MINUTES AS NEEDED FOR CHEST PAIN. MAX OF 3 TABLETS IN 15 MINUTES. CALL 911 IF PAIN  PERSISTS. 100 tablet 3   Nutritional Supplements (FEEDING SUPPLEMENT, NEPRO CARB STEADY,) LIQD Take 237 mLs by mouth 2 (two) times daily between meals. 20000 mL 3   Omega-3 Fatty Acids (FISH OIL ) 1000 MG CAPS Take 4,000 mg by mouth daily with lunch.     rosuvastatin  (CRESTOR ) 40 MG tablet Take 1 tablet (40 mg total) by mouth daily. 90 tablet 3   senna-docusate (SENOKOT S) 8.6-50 MG tablet Take 2 tablets by mouth at bedtime as needed for moderate constipation.     No current facility-administered medications for this visit.    Allergies:   Statins and Amoxicillin    Social History:   reports that he quit smoking about 34 years ago. His smoking use included pipe and cigarettes. He has never used smokeless tobacco. He reports that he does not currently use alcohol after a past usage of about 1.0 standard drink of alcohol per week. He reports that he does not use drugs.   Family History:  family history includes Heart attack (age of onset: 9) in his father; Heart failure in his mother.    ROS:     Review of Systems  Constitutional: Negative.   HENT: Negative.     Eyes: Negative.   Respiratory: Negative.    Gastrointestinal: Negative.   Genitourinary: Negative.   Musculoskeletal: Negative.   Skin: Negative.   Neurological: Negative.   Endo/Heme/Allergies: Negative.   Psychiatric/Behavioral: Negative.    All other systems reviewed and are negative.     All other systems are reviewed and negative.    PHYSICAL EXAM: VS:  BP (!) 90/52   Pulse 87   Ht 5' 10 (  1.778 m)   Wt 185 lb (83.9 kg)   SpO2 99%   BMI 26.54 kg/m  , BMI Body mass index is 26.54 kg/m. Last weight:  Wt Readings from Last 3 Encounters:  06/07/24 185 lb (83.9 kg)  05/10/24 183 lb 9.6 oz (83.3 kg)  04/12/24 187 lb 12.8 oz (85.2 kg)     Physical Exam Vitals reviewed.  Constitutional:      Appearance: Normal appearance. He is normal weight.  HENT:     Head: Normocephalic.     Nose: Nose normal.     Mouth/Throat:     Mouth: Mucous membranes are moist.  Eyes:     Pupils: Pupils are equal, round, and reactive to light.  Cardiovascular:     Rate and Rhythm: Normal rate and regular rhythm.     Pulses: Normal pulses.     Heart sounds: Normal heart sounds.  Pulmonary:     Effort: Pulmonary effort is normal.  Abdominal:     General: Abdomen is flat. Bowel sounds are normal.  Musculoskeletal:        General: Normal range of motion.     Cervical back: Normal range of motion.  Skin:    General: Skin is warm.  Neurological:     General: No focal deficit present.     Mental Status: He is alert.  Psychiatric:        Mood and Affect: Mood normal.       EKG:   Recent Labs: 06/30/2023: TSH 3.470 11/25/2023: ALT 24 03/29/2024: B Natriuretic Peptide 746.2 03/31/2024: Magnesium  2.2 04/03/2024: BUN 39; Creatinine, Ser 2.69; Hemoglobin 12.3; Platelets 84; Potassium 5.5; Sodium 130    Lipid Panel    Component Value Date/Time   CHOL 122 09/29/2023 0959   TRIG 53 09/29/2023 0959   HDL 65 09/29/2023 0959   CHOLHDL 1.9 09/29/2023 0959   LDLCALC 45 09/29/2023 0959       Other studies Reviewed: Additional studies/ records that were reviewed today include:  Review of the above records demonstrates:       No data to display            ASSESSMENT AND PLAN:    ICD-10-CM   1. SOB (shortness of breath)  R06.02    Doing well.    2. Mixed hyperlipidemia  E78.2     3. ESRD (end stage renal disease) (HCC)  N18.6     4. Cardiomyopathy, unspecified type (HCC)  I42.9     5. Coronary artery disease involving coronary bypass graft of native heart without angina pectoris  I25.810     6. HFrEF (heart failure with reduced ejection fraction) (HCC)  I50.20     7. Chronic systolic CHF (congestive heart failure) (HCC)  I50.22     8. Paroxysmal atrial fibrillation (HCC)  I48.0        Problem List Items Addressed This Visit       Cardiovascular and Mediastinum   Cardiomyopathy (HCC)   HFrEF (heart failure with reduced ejection fraction) (HCC)   Coronary artery disease   Paroxysmal atrial fibrillation (HCC)     Genitourinary   ESRD (end stage renal disease) (HCC)     Other   HLD (hyperlipidemia)   Other Visit Diagnoses       SOB (shortness of breath)    -  Primary   Doing well.     Chronic systolic CHF (congestive heart failure) (HCC)  Disposition:   Return in about 3 months (around 09/05/2024).    Total time spent: 30 minutes  Signed,  Denyse Bathe, MD  06/07/2024 10:41 AM    [facility] "

## 2024-06-27 ENCOUNTER — Encounter: Payer: Self-pay | Admitting: Dermatology

## 2024-06-28 ENCOUNTER — Ambulatory Visit: Admitting: Dermatology

## 2024-06-28 ENCOUNTER — Encounter: Payer: Self-pay | Admitting: Dermatology

## 2024-06-28 VITALS — BP 88/55 | HR 73 | Temp 98.0°F

## 2024-06-28 DIAGNOSIS — C4492 Squamous cell carcinoma of skin, unspecified: Secondary | ICD-10-CM

## 2024-06-28 NOTE — Progress Notes (Signed)
 "  Follow-Up Visit   Subjective  Matthew [name] is a 89 y.o. 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 Lesion was biopsied by Vicci Burnard Sharps, MD on 04/24/2024.  The following portions of the chart were reviewed this encounter and updated as appropriate: medications, allergies, medical history  Review of Systems:  No other skin or systemic complaints except as noted in HPI or Assessment and Plan.  Objective  Well appearing patient in no apparent distress; mood and affect are within normal limits.  A focused examination was performed of the following areas: Right temple Relevant physical exam findings are noted in the Assessment and Plan.   Right Temple Hyperkeratotic papule   Assessment & Plan     SQUAMOUS CELL CARCINOMA OF SKIN Right Temple - Mohs surgery  Consent obtained: written  Anticoagulation: Is the patient taking prescription anticoagulant and/or aspirin  prescribed/recommended by a physician? Yes   Was the anticoagulation regimen changed prior to Mohs? No    Anesthesia: Anesthesia method: local infiltration Local anesthetic: lidocaine  1% WITH epi (6mL)  Procedure Details: Timeout: pre-procedure verification complete Procedure Prep: patient was prepped and draped in usual sterile fashion Prep type: chlorhexidine  Biopsy accession number: [id number] Biopsy lab: [facility] Date of biopsy: 04/24/2024 Pre-Op diagnosis: squamous cell carcinoma SCC subtype: well differentiated MohsAIQ Surgical site (if tumor spans multiple areas, please select predominant area): temple Surgery side: right Surgical site (from skin exam): Right Temple Pre-operative length (cm): 1 Pre-operative width (cm): 0.8 Indications for Mohs surgery: anatomic location where tissue conservation is critical Previously treated? No    Micrographic Surgery Details: Post-operative length (cm): 1.5 Post-operative  width (cm): 1.2 Number of Mohs stages: 1 Post surgery depth of defect: subcutaneous fat  Stage 1    Tumor features identified on Mohs section: no tumor identified  Patient tolerance of procedure: tolerated well, no immediate complications  Reconstruction: Was the defect reconstructed? Yes   Was reconstruction performed by the same Mohs surgeon? Yes   Setting of reconstruction: outpatient office When was reconstruction performed? same day Type of reconstruction: linear Linear reconstruction: complex  Opioids: Did the patient receive a prescription for opioid/narcotic related to Mohs surgery?: No    Antibiotics: Does patient meet AHA guidelines for endocarditis?: No   Does patient meet AHA guidelines for orthopedic prophylaxis?: No   Were antibiotics given on the day of surgery?: No   Did surgery breach mucosa, expose cartilage/bone, involve an area of lymphedema/inflamed/infected tissue? No    - Skin repair Complexity:  Complex Final length (cm):  4.1 Informed consent: discussed and consent obtained   Timeout: patient name, date of birth, surgical site, and procedure verified   Procedure prep:  Patient was prepped and draped in usual sterile fashion Prep type:  Chlorhexidine  Anesthesia: the lesion was anesthetized in a standard fashion   Anesthetic:  1% lidocaine  w/ epinephrine  1-100,000 buffered w/ 8.4% NaHCO3 (3cc) Reason for type of repair: reduce the risk of dehiscence, infection, and necrosis, preserve normal anatomy, preserve normal anatomical and functional relationships and avoid adjacent structures   Undermining: area extensively undermined   Subcutaneous layers (deep stitches):  Suture size:  5-0 Suture type: Monocryl (poliglecaprone 25)   Stitches:  Buried vertical mattress Fine/surface layer approximation (top stitches):  Suture size:  6-0 Suture type: fast-absorbing plain gut   Stitches: simple running   Hemostasis achieved with: suture, pressure and  electrodesiccation Outcome: patient tolerated procedure well with no complications  Post-procedure details: sterile dressing applied and wound care instructions given   Dressing type: pressure dressing and petrolatum      Return in about 1 month (around 07/26/2024) for Wound Check.  LILLETTE Rollene Gobble, RN, am acting as scribe for RUFUS CHRISTELLA HOLY, MD .   06/28/2024  HISTORY OF PRESENT ILLNESS  Matthew [name] is seen in consultation at the request of Dr. 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 They note that the area has been present for about 6 months increasing in size with right temple.  There is no history of previous treatment.  Reports no other new or changing lesions and has no other complaints today.  Medications and allergies: see patient chart.  Review of systems: Reviewed 8 systems and notable for the above skin cancer.  All other systems reviewed are unremarkable/negative, unless noted in the HPI. Past medical history, surgical history, family history, social history were also reviewed and are noted in the chart/questionnaire.    PHYSICAL EXAMINATION  General: Well-appearing, in no acute distress, alert and oriented x 4. Vitals reviewed in chart (if available).   Skin: Exam reveals a 1.0 x 0.8 cm erythematous papule and biopsy scar on the right temple. There are rhytids, telangiectasias, and lentigines, consistent with photodamage.  Biopsy report(s) reviewed, confirming the diagnosis.   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 Due to location, size, histology, or recurrence and the likelihood of subclinical extension as well as the need to conserve normal surrounding tissue, the patient was deemed acceptable for Mohs micrographic surgery (MMS).  The nature and purpose of the procedure, associated benefits and risks including recurrence and  scarring, possible complications such as pain, infection, and bleeding, and alternative methods of treatment if appropriate were discussed with the patient during consent. The lesion location was verified by the patient, by reviewing previous notes, pathology reports, and by photographs as well as angulation measurements if available.  Informed consent was reviewed and signed by the patient, and timeout was performed at 9:30 AM. See op note below.  2. For the photodamage and solar lentigines, sun protection discussed/information given on OTC sunscreens, and we recommend continued regular follow-up with primary dermatologist every 6 months or sooner for any growing, bleeding, or changing lesions. 3. Prognosis and future surveillance discussed. 4. Letter with treatment outcome sent to referring provider. 5. Pain acetaminophen /ibuprofen    MOHS MICROGRAPHIC SURGERY AND RECONSTRUCTION  Initial size:   1.0 x 0.8 cm Surgical defect/wound size: 1.5 x 1.2 cm Anesthesia:    0.33% lidocaine  with 1:200,000 epinephrine  EBL:    <5 mL Complications:  None Repair type:   Complex SQ suture:   5-0 Monocryl Cutaneous suture:  6-0 Plain gut Final size of the repair: 4.1 cm  Stages: 1  STAGE I: Anesthesia achieved with 0.5% lidocaine  with 1:200,000 epinephrine . ChloraPrep applied. 1 section(s) excised using Mohs technique (this includes total peripheral and deep tissue margin excision and evaluation with frozen sections, excised and interpreted by the same physician). The tumor was first debulked and then excised with an approx. 2mm margin.  Hemostasis was achieved with electrocautery as needed.  The specimen was then oriented, subdivided/relaxed, inked, and processed using Mohs technique.    Frozen section analysis revealed a clear deep and peripheral margin.  Reconstruction  The surgical wound was then cleaned, prepped, and re-anesthetized as above. Wound edges were undermined extensively  along at least one  entire edge and at a distance equal to or greater than the width of the defect (see wound defect size above) in order to achieve closure and decrease wound tension and anatomic distortion. Redundant tissue repair including standing cone removal was performed. Hemostasis was achieved with electrocautery. Subcutaneous and epidermal tissues were approximated with the above sutures. The surgical site was then lightly scrubbed with sterile, saline-soaked gauze. The area was then bandaged using Vaseline ointment, non-adherent gauze, gauze pads, and tape to provide an adequate pressure dressing. The patient tolerated the procedure well, was given detailed written and verbal wound care instructions, and was discharged in good condition.   The patient will follow-up: 4 weeks.     Documentation: I have reviewed the above documentation for accuracy and completeness, and I agree with the above.  RUFUS CHRISTELLA HOLY, MD "

## 2024-06-28 NOTE — Patient Instructions (Signed)
 Primary Closure and Flap Wound Care Instructions Your wound was closed with stitches. Some stitches are under the skin and some are on top. Keep the white pressure bandage on and dry for 48 hours (72 hours if you take blood thinners). After removing the bandage: Gently wash the area with soap and water twice a day Apply a thin layer of Vaseline, Aquaphor, or petroleum jelly Keep doing this until: Stitches dissolve or are removed (about 7-10 days) DO NOT LET A SCAB FORM OVER YOUR WOUND! If a thick scab forms, you may gently clean the edges with a Q-tip and a small amount of hydrogen peroxide You should cover the wound with a non-stick bandage  IN THE EVENT OF AN AFTER HOURS EMERGENCY (PAIN, BLEEDING, INFECTION), PLEASE CALL OUR OFFICE AND YOU BE DIRECTED TO THE ON CALL STAFF. MYCHART MESSAGES SENT AFTER HOURS WILL BE ADDRESSED ON THE NEXT BUSINESS DAY  What to Watch For After Surgery Bleeding Some light bleeding is normal in the first 24 hours. To lower bleeding risk: Do not lift more than 10 pounds for 1 week If surgery was on your face, head, or neck, do not bend or stoop for 72 hours If surgery was on an arm or leg, keep it raised as much as possible (Limit standing and walking if it was on the leg) If bleeding happens: Press firmly on the area for 30 minutes without looking If bleeding continues, press again for another 30 minutes Call the office if bleeding does not stop  Swelling and Bruising Swelling and bruising are normal. Use an ice pack for 15 minutes each hour during the first 1-2 days Wrap ice in a towel to keep bandages dry Keep the area raised when possible Use extra pillows if surgery was on the head or neck Raise arms or legs near heart level if surgery was there  Infection (Uncommon) Call the office if you notice: Increasing pain Redness or swelling Yellow drainage (pus) Symptoms starting several days after surgery  Pain Control Some pain is normal. Rest,  ice, and elevation help You may take Tylenol  (acetaminophen ) and Ibuprofen (Advil/Motrin) Do not take aspirin for pain If you already take daily aspirin, continue it, but do not add extra Safe option (if allowed): Take 2 ibuprofen (200 mg each) 3 hours later take 2 Tylenol  (325 mg each) Keep alternating every 3 hours as needed If you cannot take ibuprofen: Take Tylenol  650 mg every 4-6 hours EXAMPLE: Time Medicine Dose  6:00 AM Ibuprofen 400 mg (2 tablets of 200 mg)  9:00 AM Tylenol  650 mg (2 tablets of 325 mg)  12:00 PM Ibuprofen 400 mg  3:00 PM Tylenol  650 mg  6:00 PM Ibuprofen 400 mg  9:00 PM Tylenol  650 mg   Healing and Scar Questions When will my scar fade? It takes about 1 year for the scar to mature. Redness usually fades over time. Can my wound open? Yes, the area is weak for the first 6 months. Avoid pulling or stretching it. When will feeling return? Numbness or tingling can last 1-2 years. Some numbness may be permanent. Nose stuffiness If surgery was on your nose, stuffiness can last several months and will slowly improve. When can I stop wound care? You may stop once the skin is fully healed with no open areas. Makeup and sunscreen You may use makeup and sunscreen once: Stitches are removed, and The wound is fully healed Use sunscreen daily on healed skin.  Lumps, Puffiness, and Massage Small lumps  under the skin are normal and will go away A small pimple along the scar can happen Use warm compresses Call if it does not improve Puffy scars can sometimes be treated -- call the office if concerned After 1 month, gently massage the scar 2-3 times a day  Scar Products No Scar products should be applied until 1 month after your surgery Scar creams are optional Plain Vaseline works very well and is safe Stop any product that causes irritation  Future Skin Checks You may develop skin cancer again. See your dermatologist every 6-12 months Regular skin  checks help find problems early  Important Information  Due to recent changes in healthcare laws, you may see results of your pathology and/or laboratory studies on MyChart before the doctors have had a chance to review them. We understand that in some cases there may be results that are confusing or concerning to you. Please understand that not all results are received at the same time and often the doctors may need to interpret multiple results in order to provide you with the best plan of care or course of treatment. Therefore, we ask that you please give us  2 business days to thoroughly review all your results before contacting the office for clarification. Should we see a critical lab result, you will be contacted sooner.   If You Need Anything After Your Visit  If you have any questions or concerns for your doctor, please call our main line at [phone] If no one answers, please leave a voicemail as directed and we will return your call as soon as possible. Messages left after 4 pm will be answered the following business day.   You may also send us  a message via MyChart. We typically respond to MyChart messages within 1-2 business days.  For prescription refills, please ask your pharmacy to contact our office. Our fax number is [phone].  If you have an urgent issue when the clinic is closed that cannot wait until the next business day, you can page your doctor at the number below.    Please note that while we do our best to be available for urgent issues outside of office hours, we are not available 24/7.   If you have an urgent issue and are unable to reach us , you may choose to seek medical care at your doctor's office, retail clinic, urgent care center, or emergency room.  If you have a medical emergency, please immediately call 911 or go to the emergency department. In the event of inclement weather, please call our main line at [phone] for an update on the status of any  delays or closures.  Dermatology Medication Tips: Please keep the boxes that topical medications come in in order to help keep track of the instructions about where and how to use these. Pharmacies typically print the medication instructions only on the boxes and not directly on the medication tubes.   If your medication is too expensive, please contact our office at [phone] or send us  a message through MyChart.   We are unable to tell what your co-pay for medications will be in advance as this is different depending on your insurance coverage. However, we may be able to find a substitute medication at lower cost or fill out paperwork to get insurance to cover a needed medication.   If a prior authorization is required to get your medication covered by your insurance company, please allow us  1-2 business days to complete this process.  Drug prices often vary depending on where the prescription is filled and some pharmacies may offer cheaper prices.  The website www.goodrx.com contains coupons for medications through different pharmacies. The prices here do not account for what the cost may be with help from insurance (it may be cheaper with your insurance), but the website can give you the price if you did not use any insurance.  - You can print the associated coupon and take it with your prescription to the pharmacy.  - You may also stop by our office during regular business hours and pick up a GoodRx coupon card.  - If you need your prescription sent electronically to a different pharmacy, notify our office through [facility] or by phone at [phone]

## 2024-06-29 ENCOUNTER — Other Ambulatory Visit: Payer: Self-pay

## 2024-06-29 ENCOUNTER — Telehealth: Payer: Self-pay | Admitting: Internal Medicine

## 2024-06-29 MED ORDER — APIXABAN 2.5 MG PO TABS: 2.5000 mg | ORAL_TABLET | Freq: Two times a day (BID) | ORAL | 2 refills | Status: AC

## 2024-06-29 NOTE — Telephone Encounter (Signed)
 PT daughtyer came in statingher dad needed a refill on ELIQUIS  script but it was not prescribed by [facility], can you please send request to Caleen Qualia, MD [address] [place] [address] Phone: [phone]  Send to [facility]

## 2024-07-17 ENCOUNTER — Ambulatory Visit: Admitting: Internal Medicine

## 2024-07-31 ENCOUNTER — Ambulatory Visit: Admitting: Dermatology

## 2024-08-24 ENCOUNTER — Ambulatory Visit (INDEPENDENT_AMBULATORY_CARE_PROVIDER_SITE_OTHER): Admitting: Vascular Surgery

## 2024-08-28 ENCOUNTER — Ambulatory Visit (INDEPENDENT_AMBULATORY_CARE_PROVIDER_SITE_OTHER): Admitting: Vascular Surgery

## 2024-09-11 ENCOUNTER — Ambulatory Visit: Admitting: Cardiovascular Disease

## 2024-11-27 ENCOUNTER — Ambulatory Visit: Admitting: Dermatology
